# Patient Record
Sex: Female | Born: 1955
Health system: Southern US, Community
[De-identification: ages and names within clinical notes are randomized; demographics above are authoritative.]

## PROBLEM LIST (undated history)

## (undated) DIAGNOSIS — Z5189 Encounter for other specified aftercare: Secondary | ICD-10-CM

## (undated) DIAGNOSIS — F329 Major depressive disorder, single episode, unspecified: Secondary | ICD-10-CM

## (undated) DIAGNOSIS — I82409 Acute embolism and thrombosis of unspecified deep veins of unspecified lower extremity: Secondary | ICD-10-CM

## (undated) DIAGNOSIS — R319 Hematuria, unspecified: Secondary | ICD-10-CM

## (undated) DIAGNOSIS — D801 Nonfamilial hypogammaglobulinemia: Secondary | ICD-10-CM

## (undated) DIAGNOSIS — Z9889 Other specified postprocedural states: Secondary | ICD-10-CM

## (undated) DIAGNOSIS — F32A Depression, unspecified: Secondary | ICD-10-CM

## (undated) DIAGNOSIS — G244 Idiopathic orofacial dystonia: Secondary | ICD-10-CM

## (undated) DIAGNOSIS — I10 Essential (primary) hypertension: Secondary | ICD-10-CM

## (undated) DIAGNOSIS — J9690 Respiratory failure, unspecified, unspecified whether with hypoxia or hypercapnia: Secondary | ICD-10-CM

## (undated) DIAGNOSIS — E785 Hyperlipidemia, unspecified: Secondary | ICD-10-CM

## (undated) DIAGNOSIS — N189 Chronic kidney disease, unspecified: Secondary | ICD-10-CM

## (undated) DIAGNOSIS — K219 Gastro-esophageal reflux disease without esophagitis: Secondary | ICD-10-CM

## (undated) DIAGNOSIS — G47419 Narcolepsy without cataplexy: Principal | ICD-10-CM

## (undated) DIAGNOSIS — G620 Drug-induced polyneuropathy: Secondary | ICD-10-CM

## (undated) DIAGNOSIS — D509 Iron deficiency anemia, unspecified: Secondary | ICD-10-CM

## (undated) DIAGNOSIS — F419 Anxiety disorder, unspecified: Secondary | ICD-10-CM

## (undated) DIAGNOSIS — D689 Coagulation defect, unspecified: Secondary | ICD-10-CM

## (undated) DIAGNOSIS — C8330 Diffuse large B-cell lymphoma, unspecified site: Secondary | ICD-10-CM

## (undated) DIAGNOSIS — G4730 Sleep apnea, unspecified: Secondary | ICD-10-CM

## (undated) DIAGNOSIS — Z978 Presence of other specified devices: Secondary | ICD-10-CM

## (undated) DIAGNOSIS — I871 Compression of vein: Secondary | ICD-10-CM

## (undated) DIAGNOSIS — J189 Pneumonia, unspecified organism: Secondary | ICD-10-CM

## (undated) DIAGNOSIS — T7840XA Allergy, unspecified, initial encounter: Secondary | ICD-10-CM

## (undated) DIAGNOSIS — C8590 Non-Hodgkin lymphoma, unspecified, unspecified site: Secondary | ICD-10-CM

## (undated) DIAGNOSIS — E538 Deficiency of other specified B group vitamins: Secondary | ICD-10-CM

## (undated) DIAGNOSIS — D649 Anemia, unspecified: Secondary | ICD-10-CM

## (undated) DIAGNOSIS — J984 Other disorders of lung: Secondary | ICD-10-CM

## (undated) DIAGNOSIS — I219 Acute myocardial infarction, unspecified: Secondary | ICD-10-CM

## (undated) DIAGNOSIS — M810 Age-related osteoporosis without current pathological fracture: Secondary | ICD-10-CM

## (undated) DIAGNOSIS — H269 Unspecified cataract: Secondary | ICD-10-CM

## (undated) DIAGNOSIS — J449 Chronic obstructive pulmonary disease, unspecified: Secondary | ICD-10-CM

## (undated) DIAGNOSIS — R112 Nausea with vomiting, unspecified: Secondary | ICD-10-CM

## (undated) DIAGNOSIS — T451X5A Adverse effect of antineoplastic and immunosuppressive drugs, initial encounter: Secondary | ICD-10-CM

## (undated) HISTORY — DX: Age-related osteoporosis without current pathological fracture: M81.0

## (undated) HISTORY — PX: PERIPHERALLY INSERTED CENTRAL CATHETER INSERTION: SHX2221

## (undated) HISTORY — DX: Non-Hodgkin lymphoma, unspecified, unspecified site: C85.90

## (undated) HISTORY — DX: Depression, unspecified: F32.A

## (undated) HISTORY — DX: Adverse effect of antineoplastic and immunosuppressive drugs, initial encounter: T45.1X5A

## (undated) HISTORY — DX: Sleep apnea, unspecified: G47.30

## (undated) HISTORY — DX: Drug-induced polyneuropathy: G62.0

## (undated) HISTORY — DX: Iron deficiency anemia, unspecified: D50.9

## (undated) HISTORY — DX: Hematuria, unspecified: R31.9

## (undated) HISTORY — DX: Respiratory failure, unspecified, unspecified whether with hypoxia or hypercapnia: J96.90

## (undated) HISTORY — DX: Acute embolism and thrombosis of unspecified deep veins of unspecified lower extremity: I82.409

## (undated) HISTORY — DX: Deficiency of other specified B group vitamins: E53.8

## (undated) HISTORY — DX: Essential (primary) hypertension: I10

## (undated) HISTORY — DX: Pneumonia, unspecified organism: J18.9

## (undated) HISTORY — PX: VESICOVAGINAL FISTULA CLOSURE W/ TAH: SUR271

## (undated) HISTORY — PX: ABDOMINAL HYSTERECTOMY: SHX81

## (undated) HISTORY — DX: Narcolepsy without cataplexy: G47.419

## (undated) HISTORY — DX: Encounter for other specified aftercare: Z51.89

## (undated) HISTORY — DX: Allergy, unspecified, initial encounter: T78.40XA

## (undated) HISTORY — DX: Idiopathic orofacial dystonia: G24.4

## (undated) HISTORY — DX: Gastro-esophageal reflux disease without esophagitis: K21.9

## (undated) HISTORY — PX: OTHER SURGICAL HISTORY: SHX169

## (undated) HISTORY — PX: BREAST SURGERY: SHX581

## (undated) HISTORY — DX: Other disorders of lung: J98.4

## (undated) HISTORY — PX: TRACHEOSTOMY: SUR1362

## (undated) HISTORY — DX: Diffuse large B-cell lymphoma, unspecified site: C83.30

## (undated) HISTORY — PX: PORT-A-CATH REMOVAL: SHX5289

## (undated) HISTORY — DX: Coagulation defect, unspecified: D68.9

## (undated) HISTORY — DX: Chronic obstructive pulmonary disease, unspecified: J44.9

## (undated) HISTORY — PX: NECK SURGERY: SHX720

## (undated) HISTORY — DX: Major depressive disorder, single episode, unspecified: F32.9

## (undated) HISTORY — PX: EYE SURGERY: SHX253

## (undated) HISTORY — PX: NASAL SINUS SURGERY: SHX719

## (undated) HISTORY — DX: Unspecified cataract: H26.9

## (undated) HISTORY — DX: Hyperlipidemia, unspecified: E78.5

## (undated) HISTORY — DX: Nonfamilial hypogammaglobulinemia: D80.1

## (undated) HISTORY — DX: Compression of vein: I87.1

## (undated) SURGERY — COLONOSCOPY
Anesthesia: Moderate Sedation

---

## 1997-12-18 ENCOUNTER — Observation Stay (HOSPITAL_COMMUNITY): Admission: RE | Admit: 1997-12-18 | Discharge: 1997-12-19 | Payer: Self-pay | Admitting: Neurosurgery

## 1999-05-04 ENCOUNTER — Ambulatory Visit (HOSPITAL_COMMUNITY): Admission: RE | Admit: 1999-05-04 | Discharge: 1999-05-04 | Payer: Self-pay | Admitting: Neurosurgery

## 1999-05-04 ENCOUNTER — Encounter: Payer: Self-pay | Admitting: Neurosurgery

## 1999-05-11 ENCOUNTER — Encounter: Payer: Self-pay | Admitting: Neurosurgery

## 1999-05-11 ENCOUNTER — Ambulatory Visit (HOSPITAL_COMMUNITY): Admission: RE | Admit: 1999-05-11 | Discharge: 1999-05-11 | Payer: Self-pay | Admitting: Neurosurgery

## 2000-07-26 ENCOUNTER — Encounter: Payer: Self-pay | Admitting: Obstetrics and Gynecology

## 2000-07-26 ENCOUNTER — Ambulatory Visit (HOSPITAL_COMMUNITY): Admission: RE | Admit: 2000-07-26 | Discharge: 2000-07-26 | Payer: Self-pay | Admitting: Obstetrics and Gynecology

## 2001-01-31 ENCOUNTER — Other Ambulatory Visit: Admission: RE | Admit: 2001-01-31 | Discharge: 2001-01-31 | Payer: Self-pay | Admitting: Obstetrics and Gynecology

## 2001-11-15 ENCOUNTER — Encounter: Payer: Self-pay | Admitting: Obstetrics and Gynecology

## 2001-11-15 ENCOUNTER — Ambulatory Visit (HOSPITAL_COMMUNITY): Admission: RE | Admit: 2001-11-15 | Discharge: 2001-11-15 | Payer: Self-pay | Admitting: Obstetrics and Gynecology

## 2002-01-14 ENCOUNTER — Encounter: Payer: Self-pay | Admitting: Orthopaedic Surgery

## 2002-01-14 ENCOUNTER — Ambulatory Visit (HOSPITAL_COMMUNITY): Admission: RE | Admit: 2002-01-14 | Discharge: 2002-01-14 | Payer: Self-pay | Admitting: Orthopaedic Surgery

## 2002-01-23 ENCOUNTER — Encounter (HOSPITAL_COMMUNITY): Admission: RE | Admit: 2002-01-23 | Discharge: 2002-02-22 | Payer: Self-pay | Admitting: Orthopaedic Surgery

## 2002-08-26 ENCOUNTER — Inpatient Hospital Stay (HOSPITAL_COMMUNITY): Admission: RE | Admit: 2002-08-26 | Discharge: 2002-08-27 | Payer: Self-pay | Admitting: Neurosurgery

## 2002-10-16 ENCOUNTER — Encounter
Admission: RE | Admit: 2002-10-16 | Discharge: 2003-01-14 | Payer: Self-pay | Admitting: Physical Medicine & Rehabilitation

## 2002-11-26 ENCOUNTER — Ambulatory Visit (HOSPITAL_COMMUNITY): Admission: RE | Admit: 2002-11-26 | Discharge: 2002-11-26 | Payer: Self-pay | Admitting: Obstetrics and Gynecology

## 2002-12-10 ENCOUNTER — Encounter (HOSPITAL_COMMUNITY): Admission: RE | Admit: 2002-12-10 | Discharge: 2003-01-09 | Payer: Self-pay | Admitting: Neurosurgery

## 2002-12-31 ENCOUNTER — Ambulatory Visit (HOSPITAL_COMMUNITY): Admission: RE | Admit: 2002-12-31 | Discharge: 2002-12-31 | Payer: Self-pay | Admitting: Orthopedic Surgery

## 2003-01-29 ENCOUNTER — Encounter: Admission: RE | Admit: 2003-01-29 | Discharge: 2003-01-29 | Payer: Self-pay | Admitting: Orthopedic Surgery

## 2003-02-12 ENCOUNTER — Encounter: Admission: RE | Admit: 2003-02-12 | Discharge: 2003-02-12 | Payer: Self-pay | Admitting: Orthopedic Surgery

## 2003-02-28 ENCOUNTER — Encounter: Admission: RE | Admit: 2003-02-28 | Discharge: 2003-02-28 | Payer: Self-pay | Admitting: Orthopedic Surgery

## 2003-11-19 ENCOUNTER — Ambulatory Visit: Payer: Self-pay | Admitting: Orthopedic Surgery

## 2004-01-15 ENCOUNTER — Ambulatory Visit: Payer: Self-pay | Admitting: Orthopedic Surgery

## 2004-02-13 ENCOUNTER — Ambulatory Visit (HOSPITAL_COMMUNITY): Admission: RE | Admit: 2004-02-13 | Discharge: 2004-02-13 | Payer: Self-pay | Admitting: Obstetrics and Gynecology

## 2004-03-03 ENCOUNTER — Ambulatory Visit (HOSPITAL_COMMUNITY): Admission: RE | Admit: 2004-03-03 | Discharge: 2004-03-03 | Payer: Self-pay | Admitting: Obstetrics and Gynecology

## 2004-07-12 ENCOUNTER — Ambulatory Visit: Payer: Self-pay | Admitting: Orthopedic Surgery

## 2004-09-15 ENCOUNTER — Ambulatory Visit (HOSPITAL_COMMUNITY): Admission: RE | Admit: 2004-09-15 | Discharge: 2004-09-15 | Payer: Self-pay | Admitting: Family Medicine

## 2004-11-11 ENCOUNTER — Ambulatory Visit (HOSPITAL_COMMUNITY): Admission: RE | Admit: 2004-11-11 | Discharge: 2004-11-11 | Payer: Self-pay | Admitting: Family Medicine

## 2004-12-22 ENCOUNTER — Ambulatory Visit (HOSPITAL_COMMUNITY): Admission: RE | Admit: 2004-12-22 | Discharge: 2004-12-22 | Payer: Self-pay | Admitting: Family Medicine

## 2004-12-24 ENCOUNTER — Ambulatory Visit (HOSPITAL_COMMUNITY): Admission: RE | Admit: 2004-12-24 | Discharge: 2004-12-24 | Payer: Self-pay | Admitting: Family Medicine

## 2005-01-17 ENCOUNTER — Ambulatory Visit (HOSPITAL_COMMUNITY): Admission: RE | Admit: 2005-01-17 | Discharge: 2005-01-17 | Payer: Self-pay | Admitting: Family Medicine

## 2005-01-25 ENCOUNTER — Ambulatory Visit: Payer: Self-pay | Admitting: Critical Care Medicine

## 2005-01-26 ENCOUNTER — Ambulatory Visit: Admission: RE | Admit: 2005-01-26 | Discharge: 2005-01-26 | Payer: Self-pay | Admitting: Critical Care Medicine

## 2005-01-26 ENCOUNTER — Ambulatory Visit: Payer: Self-pay | Admitting: Critical Care Medicine

## 2005-01-31 ENCOUNTER — Ambulatory Visit: Payer: Self-pay | Admitting: Cardiology

## 2005-02-09 ENCOUNTER — Ambulatory Visit: Payer: Self-pay | Admitting: Critical Care Medicine

## 2005-03-07 ENCOUNTER — Ambulatory Visit: Payer: Self-pay | Admitting: Critical Care Medicine

## 2005-03-25 ENCOUNTER — Ambulatory Visit (HOSPITAL_COMMUNITY): Admission: RE | Admit: 2005-03-25 | Discharge: 2005-03-25 | Payer: Self-pay | Admitting: Obstetrics and Gynecology

## 2005-04-27 ENCOUNTER — Ambulatory Visit: Payer: Self-pay | Admitting: Critical Care Medicine

## 2005-06-10 ENCOUNTER — Ambulatory Visit: Payer: Self-pay | Admitting: Critical Care Medicine

## 2005-07-16 ENCOUNTER — Inpatient Hospital Stay (HOSPITAL_COMMUNITY): Admission: EM | Admit: 2005-07-16 | Discharge: 2005-07-17 | Payer: Self-pay | Admitting: Emergency Medicine

## 2005-08-11 ENCOUNTER — Ambulatory Visit: Payer: Self-pay | Admitting: Critical Care Medicine

## 2005-08-16 ENCOUNTER — Ambulatory Visit: Payer: Self-pay | Admitting: Internal Medicine

## 2005-09-21 ENCOUNTER — Ambulatory Visit: Payer: Self-pay | Admitting: Internal Medicine

## 2005-09-23 ENCOUNTER — Ambulatory Visit: Payer: Self-pay | Admitting: Internal Medicine

## 2005-09-30 ENCOUNTER — Ambulatory Visit: Payer: Self-pay | Admitting: Internal Medicine

## 2005-10-04 ENCOUNTER — Ambulatory Visit: Payer: Self-pay | Admitting: Internal Medicine

## 2005-10-07 ENCOUNTER — Ambulatory Visit: Payer: Self-pay | Admitting: Internal Medicine

## 2005-10-10 ENCOUNTER — Ambulatory Visit: Payer: Self-pay | Admitting: Internal Medicine

## 2005-10-14 ENCOUNTER — Ambulatory Visit: Payer: Self-pay | Admitting: Internal Medicine

## 2005-10-18 ENCOUNTER — Ambulatory Visit: Payer: Self-pay | Admitting: Internal Medicine

## 2005-10-21 ENCOUNTER — Ambulatory Visit: Payer: Self-pay | Admitting: Internal Medicine

## 2005-10-24 IMAGING — US US BREAST*L*
1 series · 4 of 4 positions shown · non-contrast
Comparison: none

UNILATERAL LEFT DIAGNOSTIC MAMMOGRAM AND LEFT BREAST ULTRASOUND:
CLINICAL DATA: The patient returns for further evaluation of small nodule in the retroareolar 
region of the left breast.

[Series 1: unknown · 0.07mm/px · 4 of 4 slices shown]
[im 1/4]
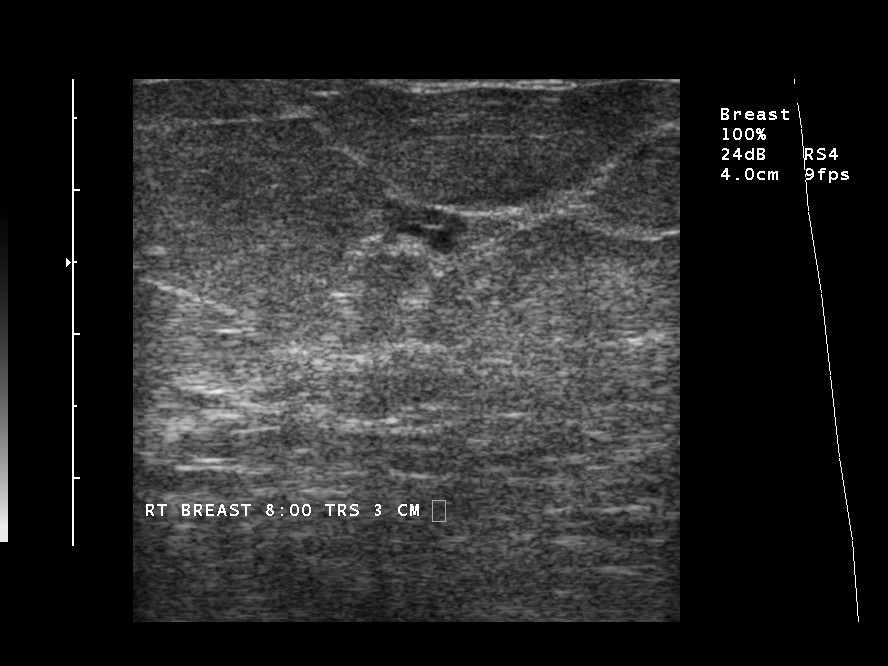
[im 2/4]
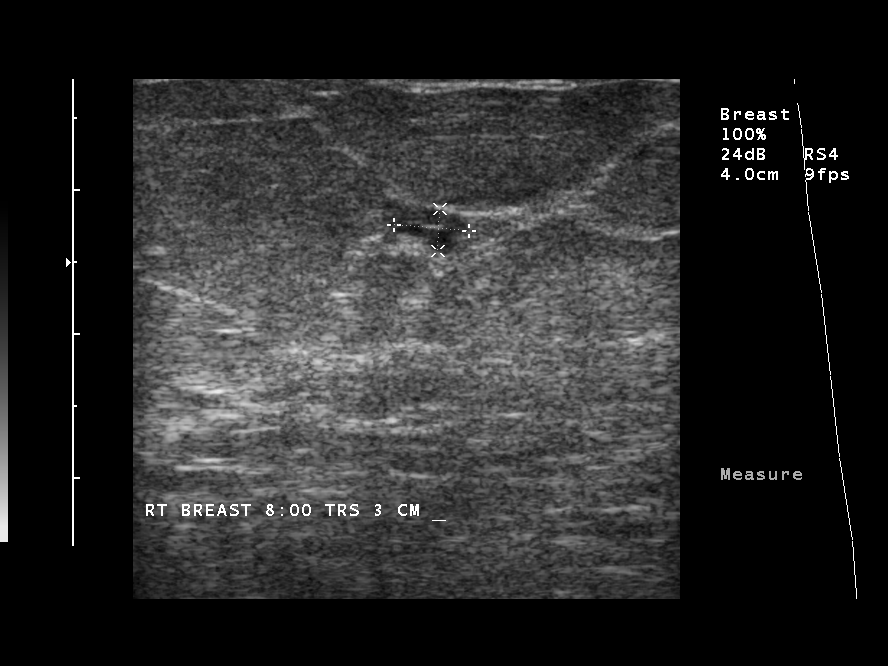
[im 3/4]
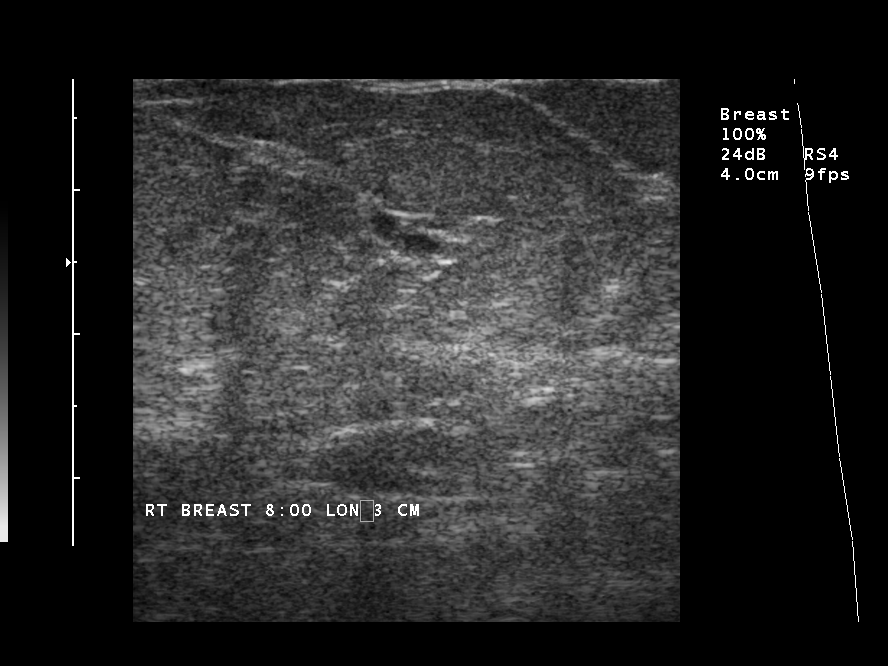
[im 4/4]
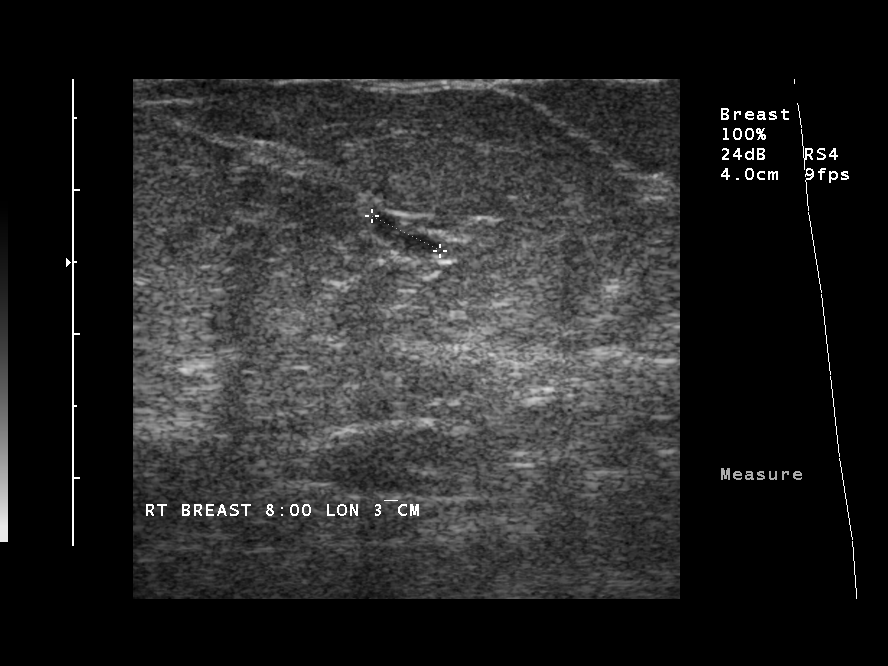

[4 of 4 positions shown; findings below may reference images not displayed]

Comparison with prior studies  02-13-04 and 12-05-02.   Spot compression views confirm its 
presence, and show well-circumscribed margins.

On physical exam, I am unable to palpate an abnormality in the retroareolar region of the left 
breast.  Ultrasound is performed, showing small cluster of cysts versus small intramammary lymph 
node in the retroareolar region, correlating well with the mammographic appearance.  No solid mass 
or area of acoustic shadowing is seen.
IMPRESSION: Small retroareolar nodule is likely benign based on its mammographic and ultrasound appearance.  I 
would recommend follow-up left ultrasound in six months to assess stability.

ASSESSMENT: Probably benign - BI-RADS 3

Ultrasound of the left breast in 6 months.

## 2005-10-25 ENCOUNTER — Ambulatory Visit: Payer: Self-pay | Admitting: Internal Medicine

## 2005-10-26 ENCOUNTER — Ambulatory Visit: Payer: Self-pay | Admitting: Internal Medicine

## 2005-10-28 ENCOUNTER — Ambulatory Visit: Payer: Self-pay | Admitting: Internal Medicine

## 2005-10-28 ENCOUNTER — Emergency Department (HOSPITAL_COMMUNITY): Admission: EM | Admit: 2005-10-28 | Discharge: 2005-10-28 | Payer: Self-pay | Admitting: Emergency Medicine

## 2005-11-01 ENCOUNTER — Ambulatory Visit: Payer: Self-pay | Admitting: Internal Medicine

## 2005-11-02 ENCOUNTER — Ambulatory Visit: Payer: Self-pay | Admitting: Internal Medicine

## 2005-11-04 ENCOUNTER — Ambulatory Visit: Payer: Self-pay | Admitting: Internal Medicine

## 2005-11-15 ENCOUNTER — Ambulatory Visit: Payer: Self-pay | Admitting: Internal Medicine

## 2005-12-05 ENCOUNTER — Ambulatory Visit: Payer: Self-pay | Admitting: Internal Medicine

## 2005-12-17 ENCOUNTER — Emergency Department (HOSPITAL_COMMUNITY): Admission: EM | Admit: 2005-12-17 | Discharge: 2005-12-17 | Payer: Self-pay | Admitting: Emergency Medicine

## 2005-12-26 ENCOUNTER — Ambulatory Visit (HOSPITAL_COMMUNITY): Admission: RE | Admit: 2005-12-26 | Discharge: 2005-12-26 | Payer: Self-pay | Admitting: Family Medicine

## 2006-01-16 ENCOUNTER — Ambulatory Visit: Payer: Self-pay | Admitting: Internal Medicine

## 2006-02-14 ENCOUNTER — Ambulatory Visit: Payer: Self-pay | Admitting: Internal Medicine

## 2006-02-20 ENCOUNTER — Ambulatory Visit: Payer: Self-pay | Admitting: Internal Medicine

## 2006-03-03 ENCOUNTER — Ambulatory Visit (HOSPITAL_COMMUNITY): Admission: RE | Admit: 2006-03-03 | Discharge: 2006-03-03 | Payer: Self-pay | Admitting: *Deleted

## 2006-04-13 ENCOUNTER — Ambulatory Visit: Payer: Self-pay | Admitting: Critical Care Medicine

## 2006-04-13 ENCOUNTER — Ambulatory Visit (HOSPITAL_COMMUNITY): Admission: RE | Admit: 2006-04-13 | Discharge: 2006-04-13 | Payer: Self-pay | Admitting: Obstetrics and Gynecology

## 2006-05-17 ENCOUNTER — Ambulatory Visit (HOSPITAL_COMMUNITY): Admission: RE | Admit: 2006-05-17 | Discharge: 2006-05-17 | Payer: Self-pay | Admitting: Obstetrics and Gynecology

## 2006-05-25 ENCOUNTER — Ambulatory Visit: Payer: Self-pay | Admitting: Critical Care Medicine

## 2006-05-25 ENCOUNTER — Ambulatory Visit: Payer: Self-pay | Admitting: Internal Medicine

## 2006-07-10 ENCOUNTER — Ambulatory Visit: Payer: Self-pay | Admitting: Gastroenterology

## 2006-07-25 ENCOUNTER — Encounter: Payer: Self-pay | Admitting: Gastroenterology

## 2006-07-25 ENCOUNTER — Ambulatory Visit: Payer: Self-pay | Admitting: Gastroenterology

## 2006-07-25 DIAGNOSIS — D131 Benign neoplasm of stomach: Secondary | ICD-10-CM | POA: Insufficient documentation

## 2006-08-16 IMAGING — US US ABDOMEN LIMITED
1 series · 14 of 25 positions shown · non-contrast
Comparison: Report of the chest CT performed at [REDACTED] on
12/17/2004.

<!--  IDXRADR:ADDEND:BEGIN -->Addendum Begins
<!--  IDXRADR:ADDEND:INNER_BEGIN -->Addendum: 

The chest CT images, dated 12/17/2004, are now available for comparison. The low
density lesion in the liver, seen on the CT, appears to correlate with the more
superiorly located lesion seen today. The more inferiorly located lesion does
not appear to have been included on the CT.
<!--  IDXRADR:ADDEND:INNER_END -->Addendum Ends
<!--  IDXRADR:ADDEND:END -->Clinical Data:  Approximately 1.5 cm low-density lesion in the lower portion of
the right lobe of the liver on a chest CT performed at [REDACTED]
on 12/17/2004.
LIMITED ABDOMEN ULTRASOUND

[Series 1: unknown · 0.33mm/px · 14 of 31 slices shown]
[im 1/31]
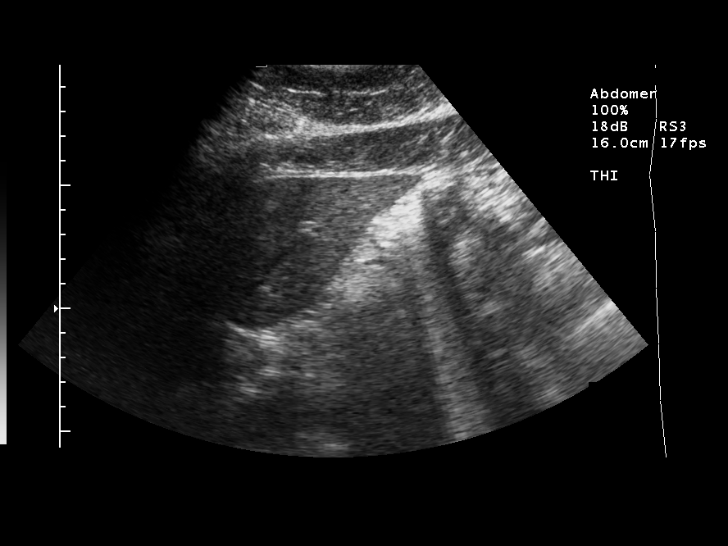
[im 3/31]
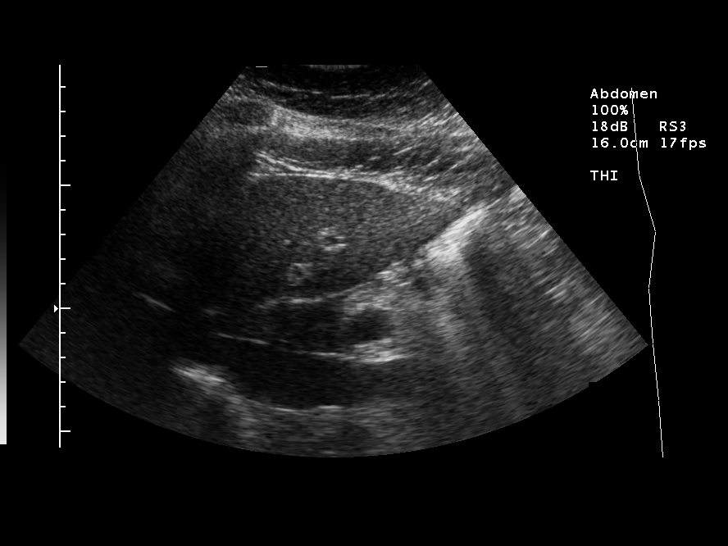
[im 6/31]
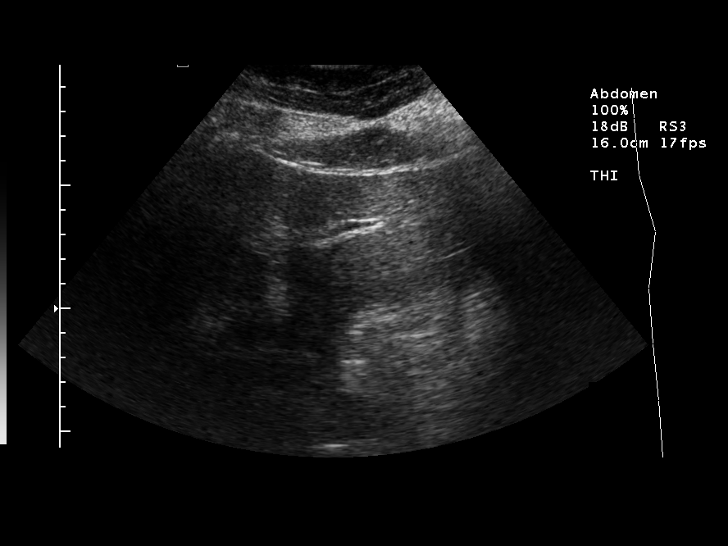
[im 8/31]
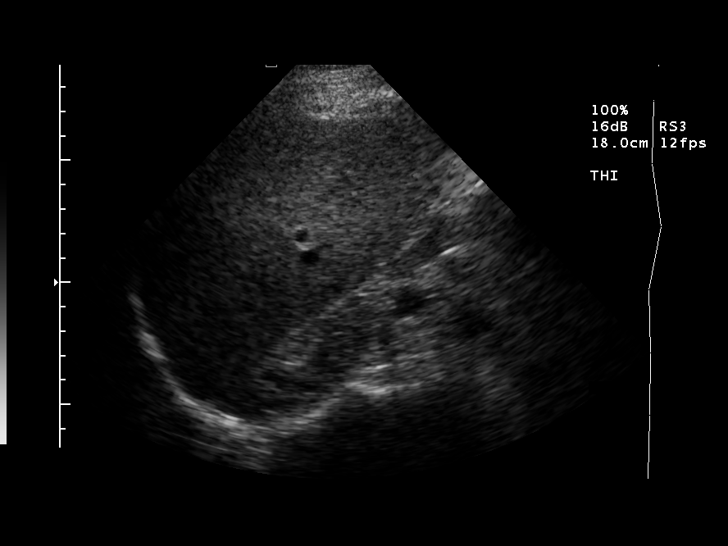
[im 11/31]
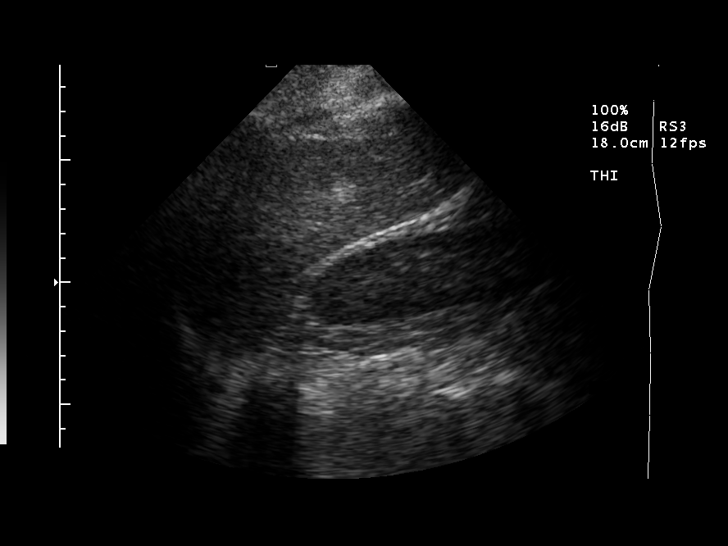
[im 12/31]
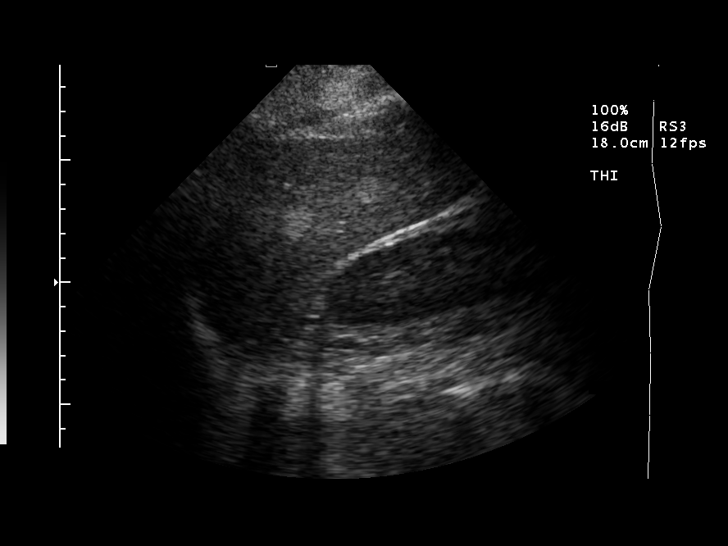
[im 14/31]
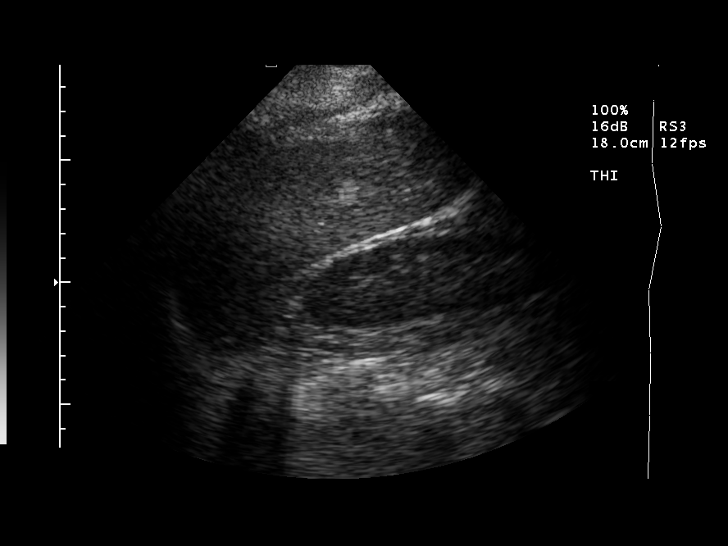
[im 17/31]
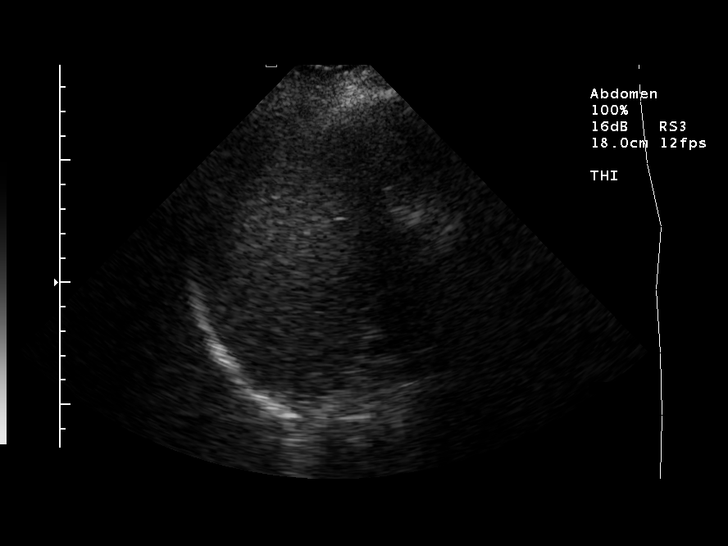
[im 19/31]
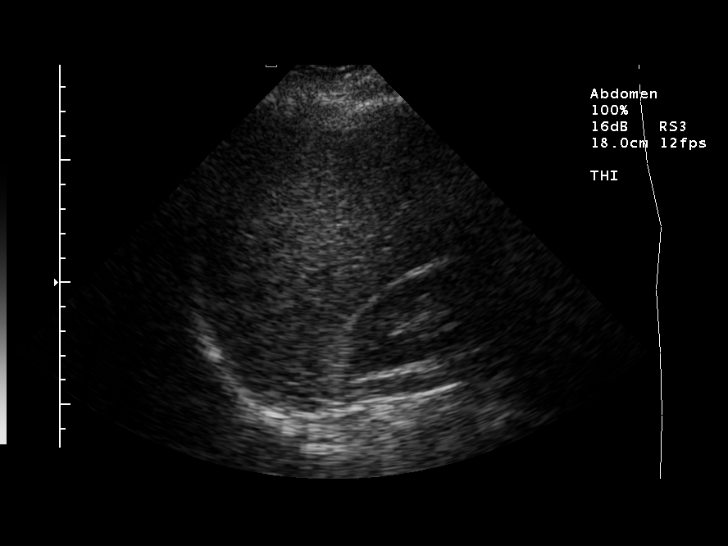
[im 21/31]
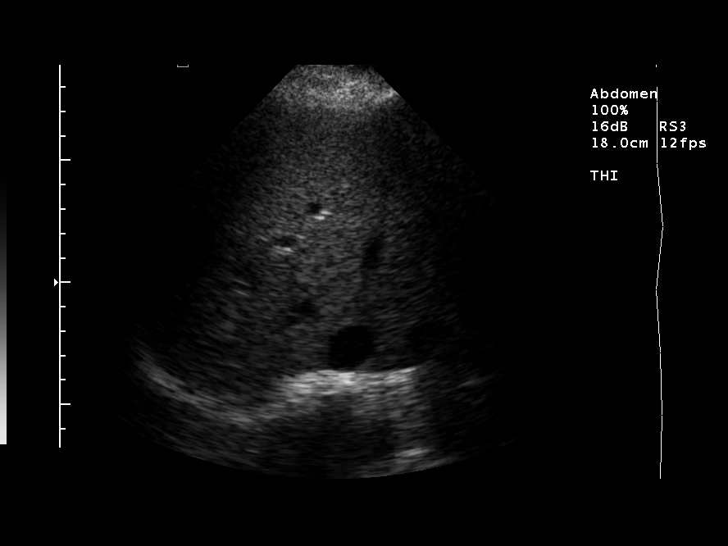
[im 23/31]
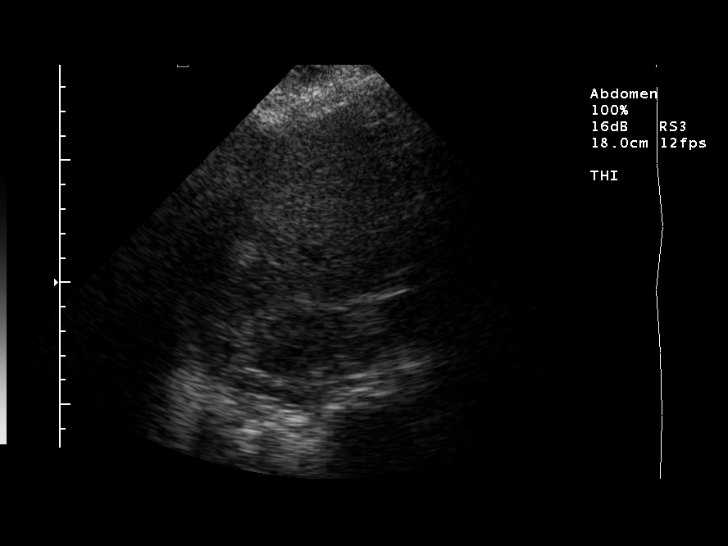
[im 26/31]
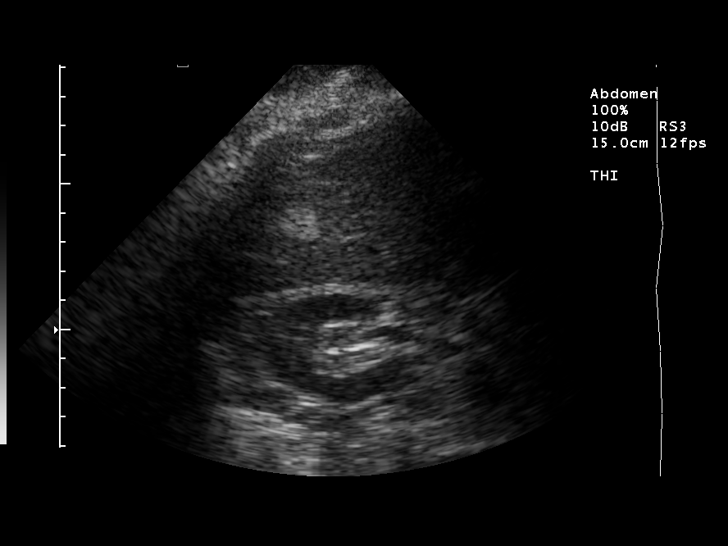
[im 28/31]
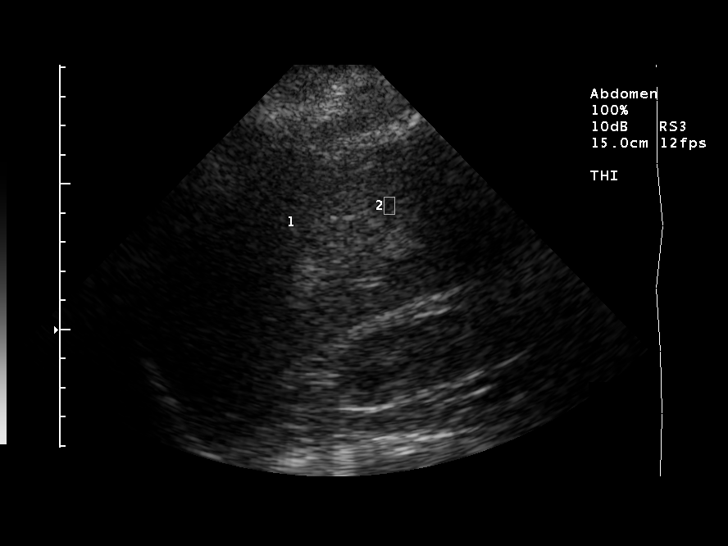
[im 31/31]
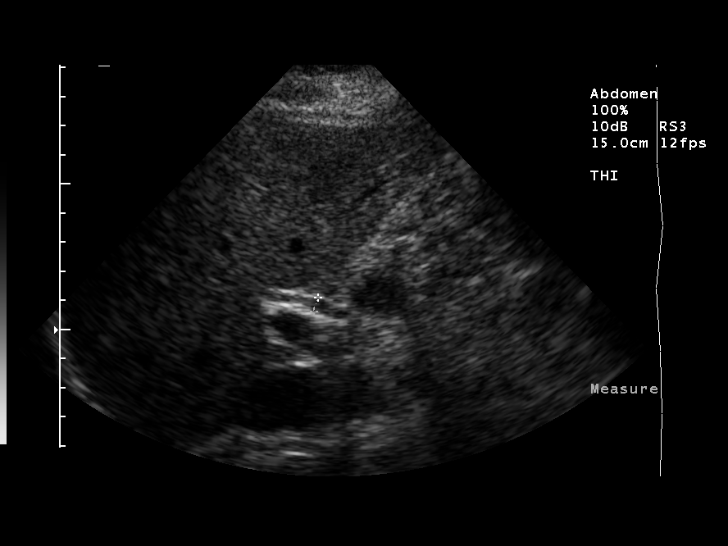

[14 of 25 positions shown; findings below may reference images not displayed]

FINDINGS: Sonographic examination of the liver demonstrates two adjacent
rounded, homogeneously echogenic masses in the right lobe of the liver. One of
these measures 1.6 x 1.4 x 1.3 cm in maximum dimensions. The other measures
x 1.3 x 1.3 cm in maximum dimensions. No biliary ductal dilatation seen. The
common duct measures 5.0 mm in maximum diameter proximally.

IMPRESSION

1.6 and 1.4 cm hemangiomas in the right lobe of the liver.

## 2006-08-31 ENCOUNTER — Ambulatory Visit (HOSPITAL_COMMUNITY): Admission: RE | Admit: 2006-08-31 | Discharge: 2006-08-31 | Payer: Self-pay | Admitting: Gastroenterology

## 2006-09-23 IMAGING — CT CT PARANASAL SINUSES LIMITED
1 of 2 series · 16 of 25 positions shown, 20 images · non-contrast
Comparison: none

CLINICAL DATA: Cough, sinus drainage.  History of asthma.
LIMITED CT OF PARANASAL SINUSES:
TECHNIQUE: Limited coronal CT images were obtained through the paranasal sinuses without intravenous contrast.

[Series 4: ltd sinus 3.0 h30s · axial · 0.29mm/px · z∈[-101,-3]mm · 16 of 24 slices shown, 20 images]
[im 2/24  brain]
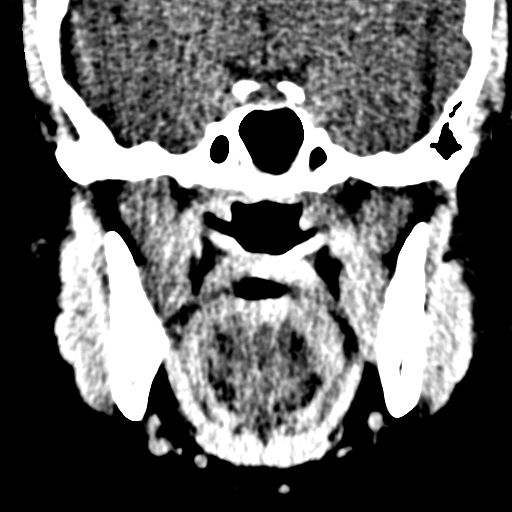
[im 2/24  bone]
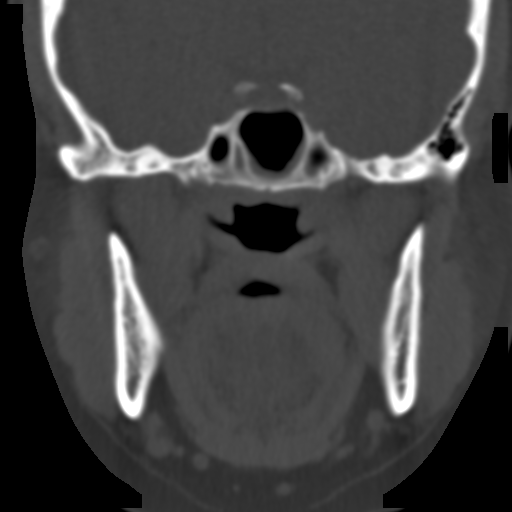
[im 4/24  bone]
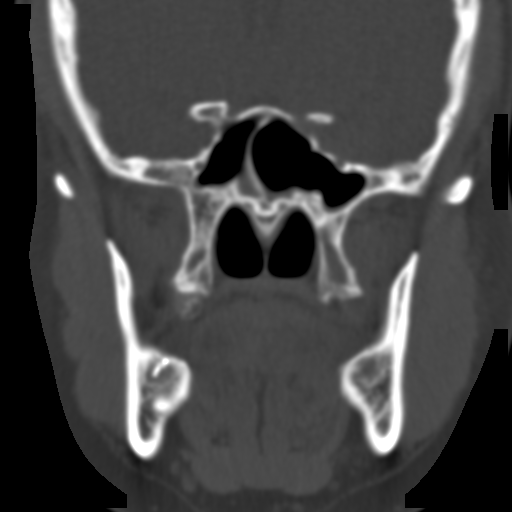
[im 5/24  bone]
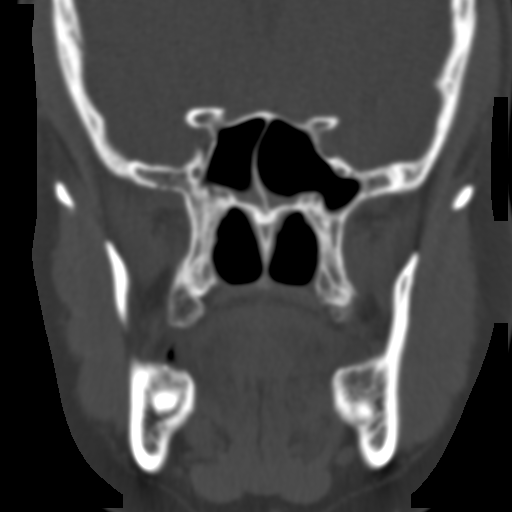
[im 6/24  bone]
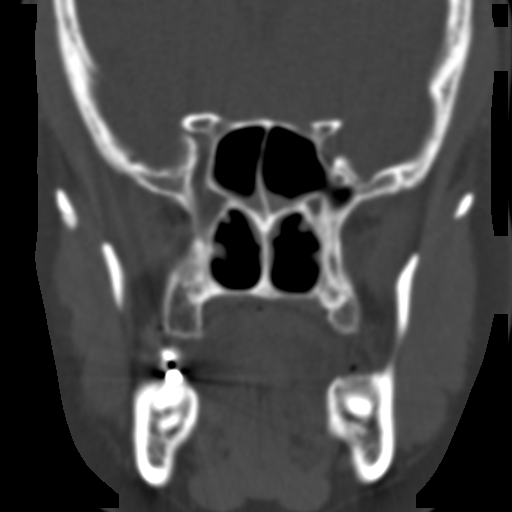
[im 8/24  brain]
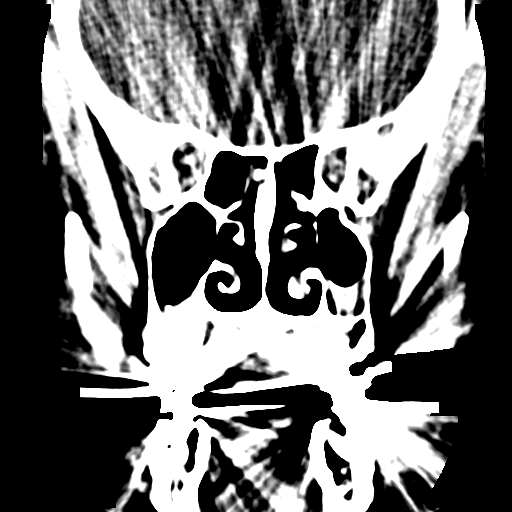
[im 8/24  bone]
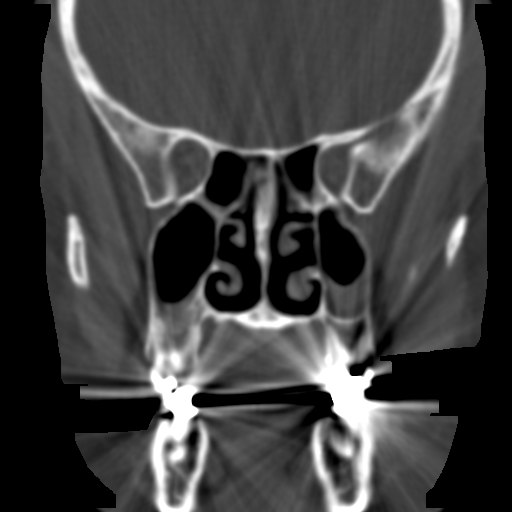
[im 9/24  bone]
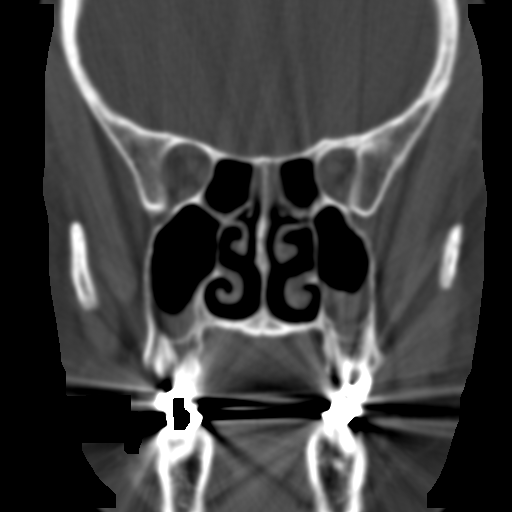
[im 10/24  bone]
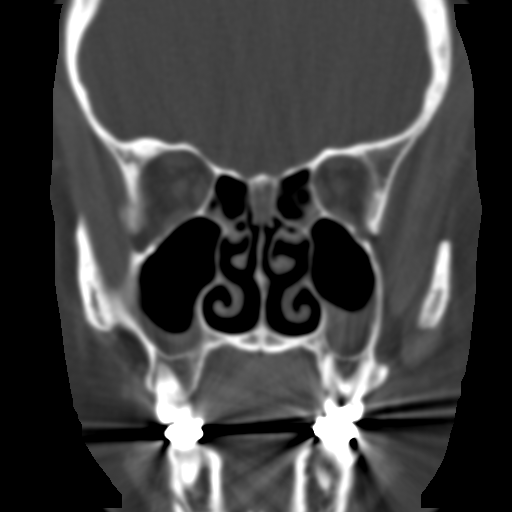
[im 12/24  bone]
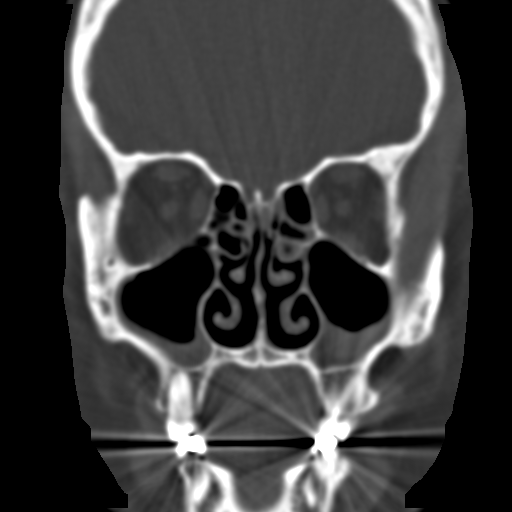
[im 13/24  brain]
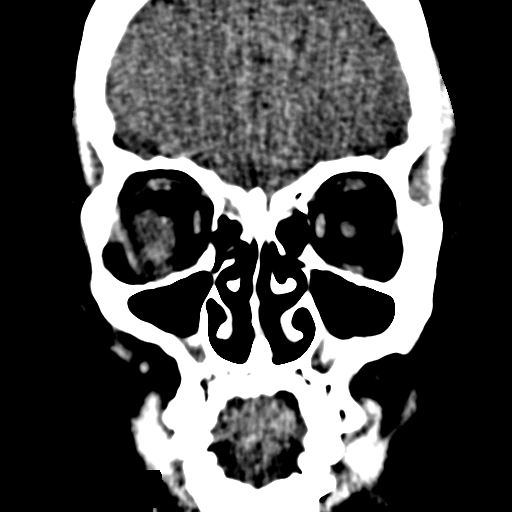
[im 13/24  bone]
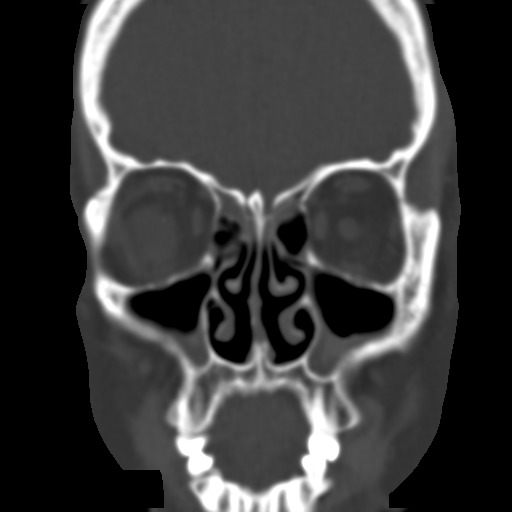
[im 15/24  bone]
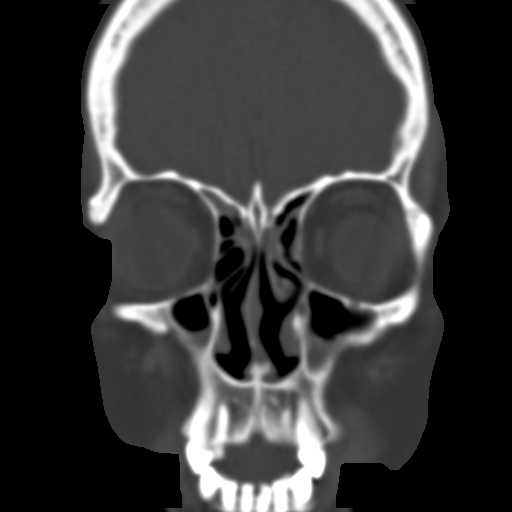
[im 16/24  bone]
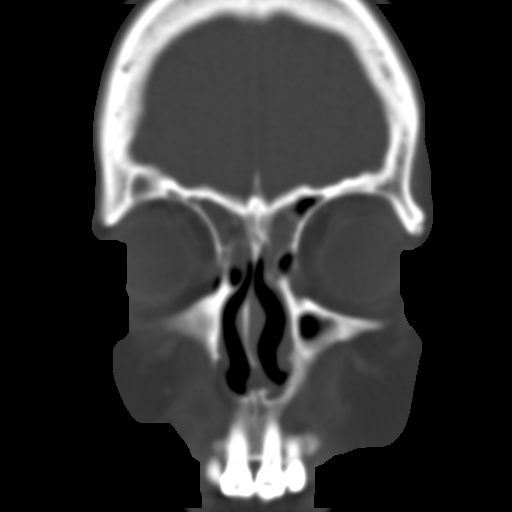
[im 17/24  bone]
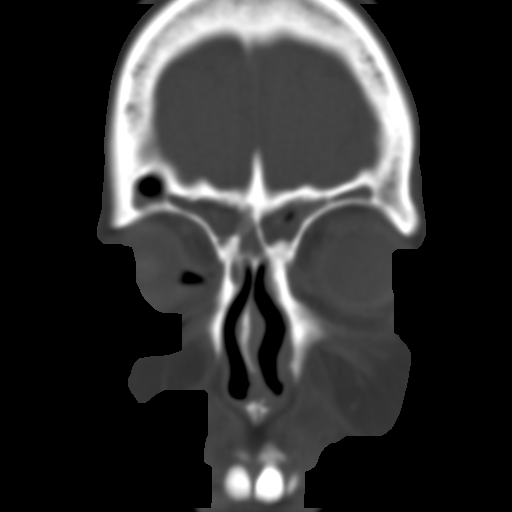
[im 19/24  brain]
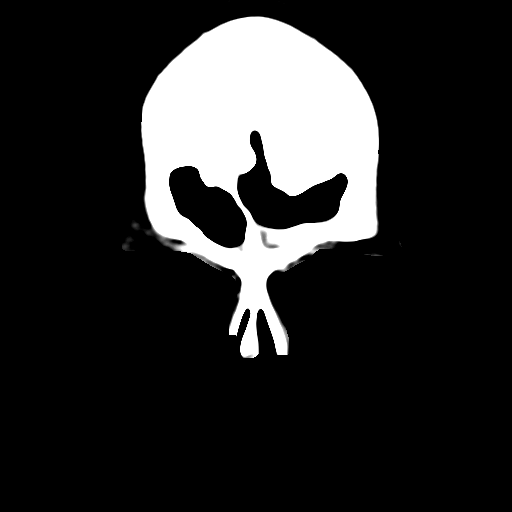
[im 19/24  bone]
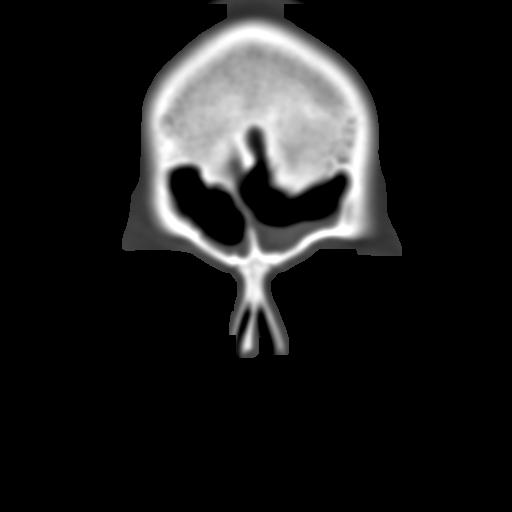
[im 20/24  bone]
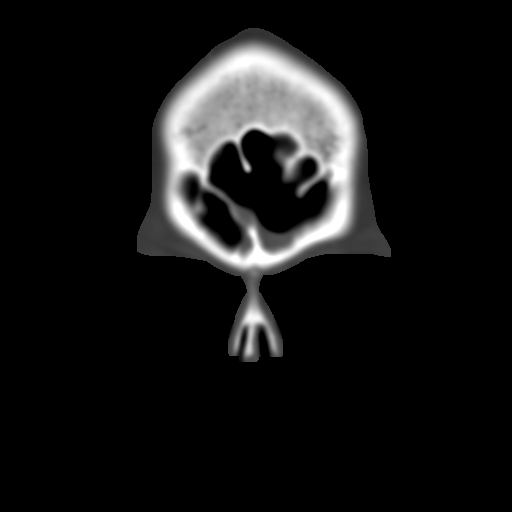
[im 21/24  bone]
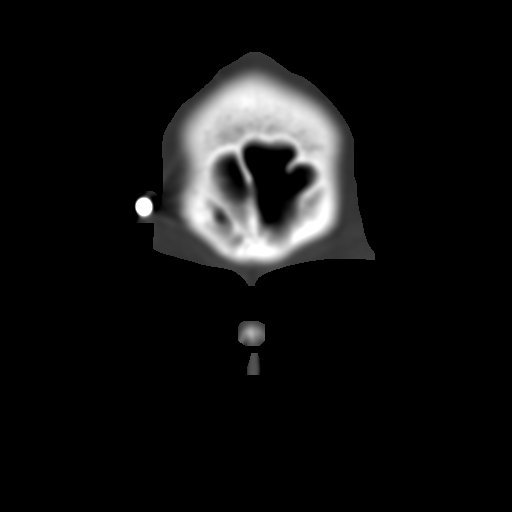
[im 23/24  bone]
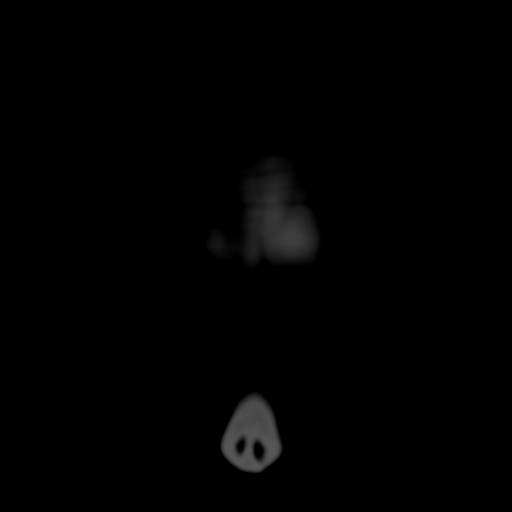

[16 of 25 positions shown; findings below may reference images not displayed]

FINDINGS: There is mucosal thickening in the sphenoid sinus, with mucosal thickening and a small amount of fluid in the maxillary sinuses.  There is opacification of the frontal sinuses, left greater than right, with some opacification of the ethmoid air cells.  These findings are consistent with paranasal sinus disease with active sinusitis involving the frontal and to a lesser degree the maxillary sinuses.  The nasal turbinates are small and the nasal airway is patent.
IMPRESSION: Diffuse paranasal sinus disease with sinusitis involving the frontal and maxillary sinuses and mucosal thickening in the ethmoid and sphenoid sinuses.

## 2006-10-13 ENCOUNTER — Ambulatory Visit: Payer: Self-pay | Admitting: Internal Medicine

## 2006-10-17 ENCOUNTER — Other Ambulatory Visit: Admission: RE | Admit: 2006-10-17 | Discharge: 2006-10-17 | Payer: Self-pay | Admitting: Obstetrics and Gynecology

## 2006-10-19 DIAGNOSIS — K219 Gastro-esophageal reflux disease without esophagitis: Secondary | ICD-10-CM | POA: Insufficient documentation

## 2006-10-19 DIAGNOSIS — J449 Chronic obstructive pulmonary disease, unspecified: Secondary | ICD-10-CM | POA: Insufficient documentation

## 2006-10-19 DIAGNOSIS — J019 Acute sinusitis, unspecified: Secondary | ICD-10-CM | POA: Insufficient documentation

## 2006-10-19 DIAGNOSIS — J309 Allergic rhinitis, unspecified: Secondary | ICD-10-CM | POA: Insufficient documentation

## 2006-10-19 DIAGNOSIS — J4489 Other specified chronic obstructive pulmonary disease: Secondary | ICD-10-CM | POA: Insufficient documentation

## 2006-10-19 DIAGNOSIS — J383 Other diseases of vocal cords: Secondary | ICD-10-CM | POA: Insufficient documentation

## 2006-12-20 ENCOUNTER — Ambulatory Visit: Payer: Self-pay | Admitting: Critical Care Medicine

## 2007-02-23 ENCOUNTER — Ambulatory Visit: Payer: Self-pay | Admitting: Critical Care Medicine

## 2007-02-28 ENCOUNTER — Ambulatory Visit: Payer: Self-pay | Admitting: Internal Medicine

## 2007-03-01 ENCOUNTER — Encounter: Payer: Self-pay | Admitting: Critical Care Medicine

## 2007-03-08 IMAGING — CR DG CHEST 1V PORT
1 series · 1 of 1 positions shown · non-contrast
Comparison: 01/17/05.

CLINICAL DATA: Asthma.  Shortness of breath.  
 PORTABLE CHEST -  SINGLE VIEW - 07/16/05:

[view not recorded]
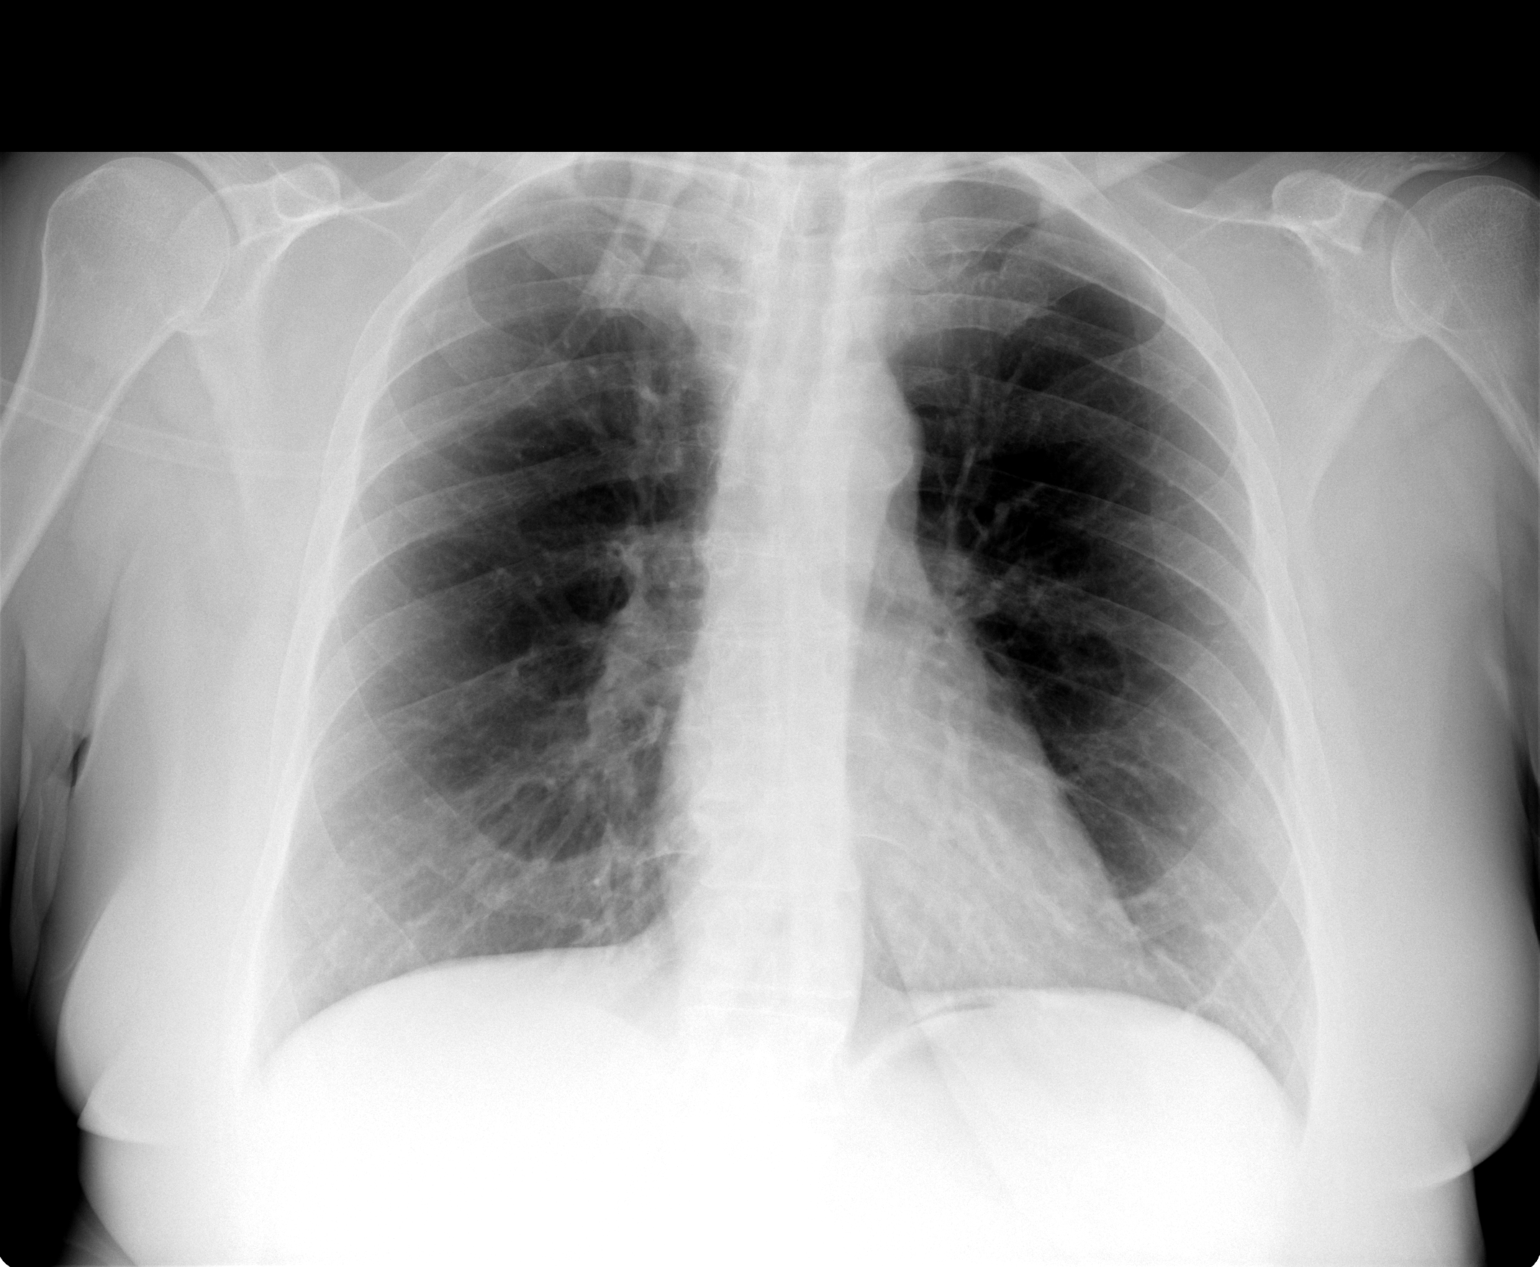

[1 of 1 positions shown; findings below may reference images not displayed]

FINDINGS: Midline trachea. The heart size is normal.   Mediastinal contours are within normal limits.  Mild atherosclerosis of the transverse aorta.  The costophrenic angles are sharp.  The lungs are clear.   There is mild COPD.  There is a lucency under the left hemidiaphragm.  It most likely relates to air within the stomach.
IMPRESSION: 1.  COPD with no acute cardiopulmonary disease.
 2.  Lucency under the left hemidiaphragm is likely within the stomach.  Correlate with any abdominal symptoms.

## 2007-03-28 ENCOUNTER — Telehealth: Payer: Self-pay | Admitting: Critical Care Medicine

## 2007-03-29 ENCOUNTER — Ambulatory Visit: Payer: Self-pay | Admitting: Critical Care Medicine

## 2007-04-03 DIAGNOSIS — J4540 Moderate persistent asthma, uncomplicated: Secondary | ICD-10-CM | POA: Insufficient documentation

## 2007-04-21 ENCOUNTER — Encounter: Payer: Self-pay | Admitting: Gastroenterology

## 2007-04-21 DIAGNOSIS — R7309 Other abnormal glucose: Secondary | ICD-10-CM | POA: Insufficient documentation

## 2007-04-21 DIAGNOSIS — I10 Essential (primary) hypertension: Secondary | ICD-10-CM | POA: Insufficient documentation

## 2007-04-21 DIAGNOSIS — E785 Hyperlipidemia, unspecified: Secondary | ICD-10-CM | POA: Insufficient documentation

## 2007-04-26 ENCOUNTER — Ambulatory Visit: Payer: Self-pay | Admitting: Critical Care Medicine

## 2007-05-10 ENCOUNTER — Ambulatory Visit: Payer: Self-pay | Admitting: Internal Medicine

## 2007-05-24 ENCOUNTER — Ambulatory Visit: Payer: Self-pay | Admitting: Internal Medicine

## 2007-06-01 ENCOUNTER — Telehealth (INDEPENDENT_AMBULATORY_CARE_PROVIDER_SITE_OTHER): Payer: Self-pay | Admitting: *Deleted

## 2007-06-05 ENCOUNTER — Ambulatory Visit (HOSPITAL_COMMUNITY): Admission: RE | Admit: 2007-06-05 | Discharge: 2007-06-05 | Payer: Self-pay | Admitting: Family Medicine

## 2007-06-07 ENCOUNTER — Ambulatory Visit: Payer: Self-pay | Admitting: Critical Care Medicine

## 2007-06-28 ENCOUNTER — Ambulatory Visit: Payer: Self-pay | Admitting: Critical Care Medicine

## 2007-07-27 ENCOUNTER — Ambulatory Visit: Payer: Self-pay | Admitting: Critical Care Medicine

## 2007-08-02 ENCOUNTER — Ambulatory Visit: Payer: Self-pay | Admitting: Internal Medicine

## 2007-08-09 IMAGING — CR DG CHEST 1V PORT
1 series · 1 of 1 positions shown · non-contrast
Comparison: Portable chest x-ray 07/16/2005.

CLINICAL DATA: Asthma exacerbation with wheezing and shortness of breath.

PORTABLE CHEST - 1 VIEW  [DATE]/[id number] hours:

[view not recorded]
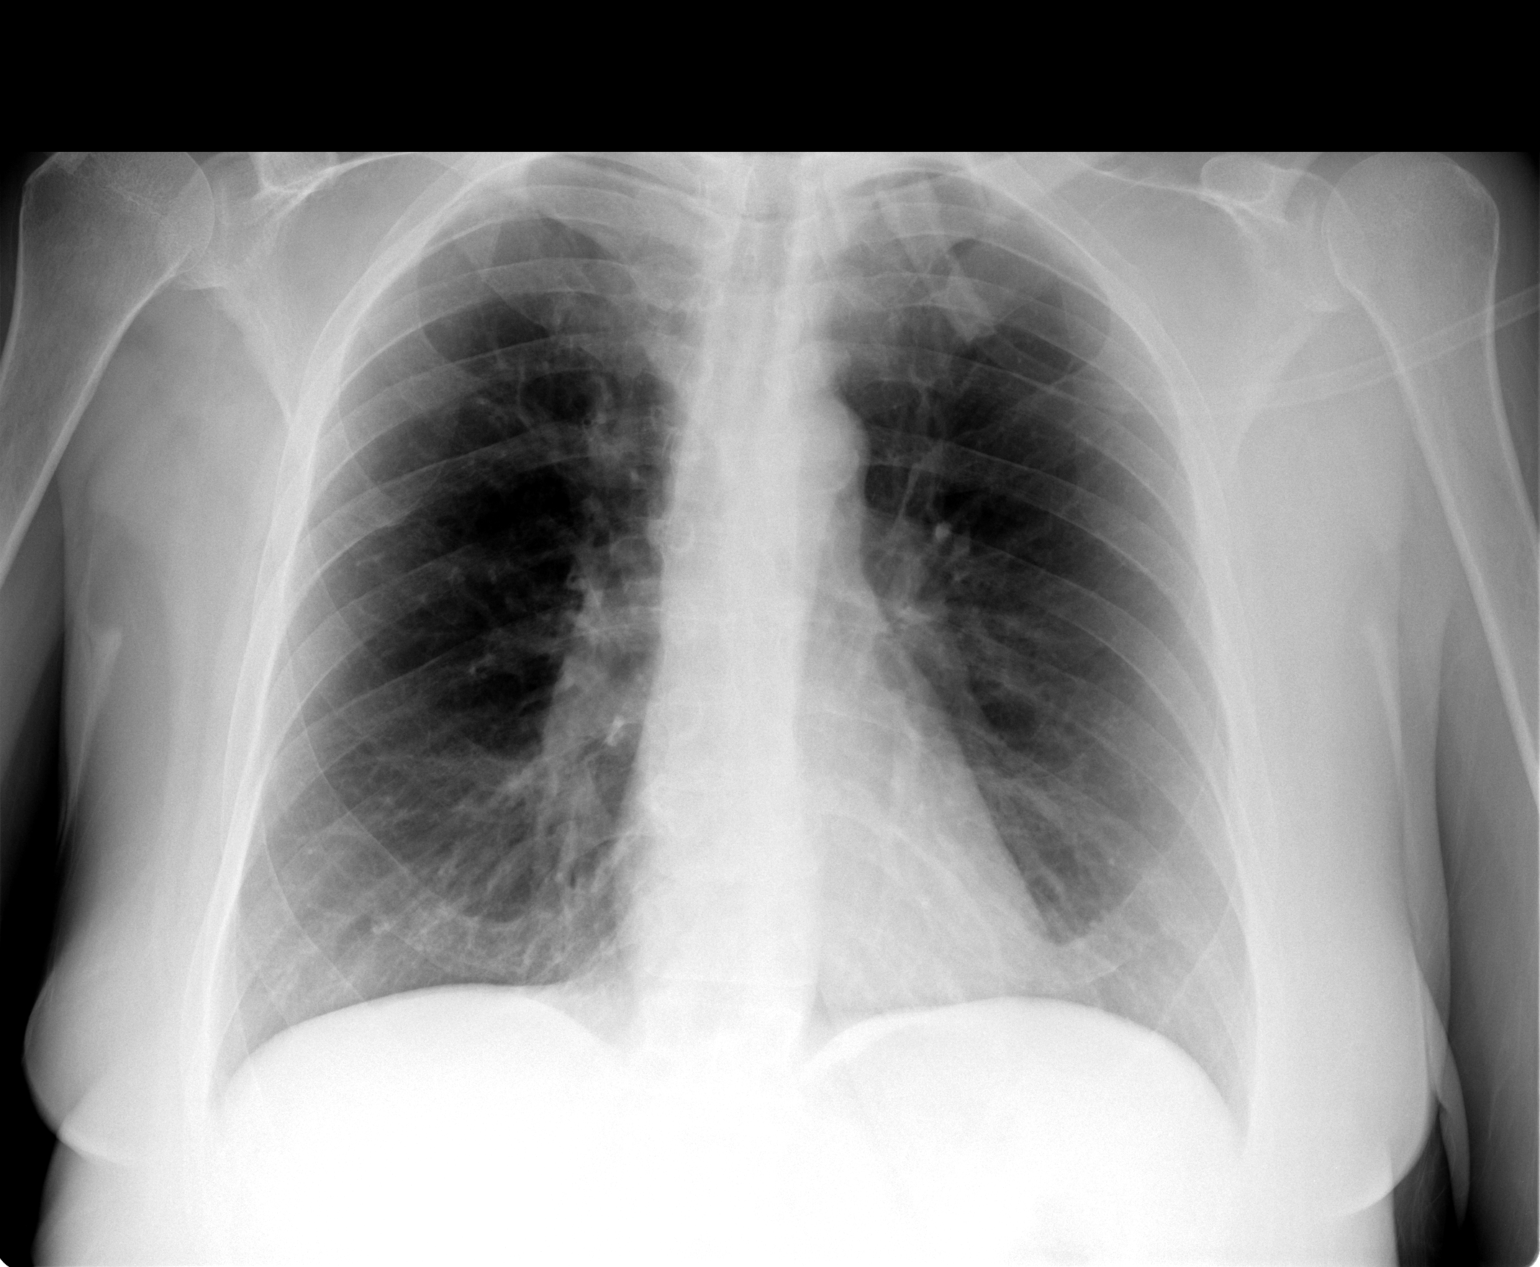

[1 of 1 positions shown; findings below may reference images not displayed]

FINDINGS: The cardiomediastinal silhouette is unremarkable and is unchanged.
The lungs are clear. There are no pleural effusions.
IMPRESSION: No acute cardiopulmonary disease.

## 2007-08-28 ENCOUNTER — Ambulatory Visit: Payer: Self-pay | Admitting: Critical Care Medicine

## 2007-09-07 ENCOUNTER — Ambulatory Visit: Payer: Self-pay | Admitting: Critical Care Medicine

## 2007-10-08 ENCOUNTER — Ambulatory Visit: Payer: Self-pay | Admitting: Critical Care Medicine

## 2007-11-19 ENCOUNTER — Ambulatory Visit: Payer: Self-pay | Admitting: Critical Care Medicine

## 2007-12-10 ENCOUNTER — Ambulatory Visit: Payer: Self-pay | Admitting: Critical Care Medicine

## 2007-12-24 ENCOUNTER — Encounter: Payer: Self-pay | Admitting: Critical Care Medicine

## 2008-01-15 ENCOUNTER — Telehealth: Payer: Self-pay | Admitting: Critical Care Medicine

## 2008-01-18 ENCOUNTER — Ambulatory Visit: Payer: Self-pay | Admitting: Critical Care Medicine

## 2008-01-19 ENCOUNTER — Emergency Department (HOSPITAL_COMMUNITY): Admission: EM | Admit: 2008-01-19 | Discharge: 2008-01-20 | Payer: Self-pay | Admitting: Emergency Medicine

## 2008-02-12 ENCOUNTER — Ambulatory Visit: Payer: Self-pay | Admitting: Critical Care Medicine

## 2008-02-12 ENCOUNTER — Inpatient Hospital Stay (HOSPITAL_COMMUNITY): Admission: EM | Admit: 2008-02-12 | Discharge: 2008-02-14 | Payer: Self-pay | Admitting: Emergency Medicine

## 2008-02-14 ENCOUNTER — Encounter: Payer: Self-pay | Admitting: Critical Care Medicine

## 2008-02-21 ENCOUNTER — Ambulatory Visit: Payer: Self-pay | Admitting: Critical Care Medicine

## 2008-02-29 ENCOUNTER — Telehealth (INDEPENDENT_AMBULATORY_CARE_PROVIDER_SITE_OTHER): Payer: Self-pay | Admitting: *Deleted

## 2008-03-06 ENCOUNTER — Telehealth: Payer: Self-pay | Admitting: Adult Health

## 2008-03-24 ENCOUNTER — Ambulatory Visit: Payer: Self-pay | Admitting: Critical Care Medicine

## 2008-04-09 ENCOUNTER — Ambulatory Visit: Payer: Self-pay | Admitting: Internal Medicine

## 2008-04-15 ENCOUNTER — Ambulatory Visit: Payer: Self-pay | Admitting: Critical Care Medicine

## 2008-04-28 ENCOUNTER — Ambulatory Visit: Payer: Self-pay | Admitting: Internal Medicine

## 2008-05-30 ENCOUNTER — Ambulatory Visit: Payer: Self-pay | Admitting: Critical Care Medicine

## 2008-06-16 ENCOUNTER — Ambulatory Visit: Payer: Self-pay | Admitting: Critical Care Medicine

## 2008-06-30 ENCOUNTER — Ambulatory Visit: Payer: Self-pay | Admitting: Critical Care Medicine

## 2008-07-04 ENCOUNTER — Ambulatory Visit (HOSPITAL_COMMUNITY): Admission: RE | Admit: 2008-07-04 | Discharge: 2008-07-04 | Payer: Self-pay | Admitting: Obstetrics & Gynecology

## 2008-07-30 ENCOUNTER — Ambulatory Visit: Payer: Self-pay | Admitting: Critical Care Medicine

## 2008-08-08 ENCOUNTER — Ambulatory Visit: Payer: Self-pay | Admitting: Critical Care Medicine

## 2008-08-27 ENCOUNTER — Encounter: Payer: Self-pay | Admitting: Critical Care Medicine

## 2008-09-01 ENCOUNTER — Ambulatory Visit: Payer: Self-pay | Admitting: Internal Medicine

## 2008-09-01 LAB — CONVERTED CEMR LAB: IgE (Immunoglobulin E), Serum: 159.8 intl units/mL (ref 0.0–180.0)

## 2008-09-19 ENCOUNTER — Ambulatory Visit: Payer: Self-pay | Admitting: Pulmonary Disease

## 2008-09-19 ENCOUNTER — Encounter: Payer: Self-pay | Admitting: Pulmonary Disease

## 2008-09-24 ENCOUNTER — Telehealth: Payer: Self-pay | Admitting: Internal Medicine

## 2008-09-26 ENCOUNTER — Ambulatory Visit: Payer: Self-pay | Admitting: Internal Medicine

## 2008-09-26 ENCOUNTER — Encounter: Payer: Self-pay | Admitting: Adult Health

## 2008-09-26 ENCOUNTER — Ambulatory Visit: Payer: Self-pay | Admitting: Pulmonary Disease

## 2008-10-01 ENCOUNTER — Ambulatory Visit: Payer: Self-pay | Admitting: Internal Medicine

## 2008-10-02 ENCOUNTER — Telehealth (INDEPENDENT_AMBULATORY_CARE_PROVIDER_SITE_OTHER): Payer: Self-pay | Admitting: *Deleted

## 2008-10-07 ENCOUNTER — Encounter: Payer: Self-pay | Admitting: Internal Medicine

## 2008-10-22 ENCOUNTER — Ambulatory Visit: Payer: Self-pay | Admitting: Internal Medicine

## 2008-10-24 ENCOUNTER — Ambulatory Visit: Payer: Self-pay | Admitting: Critical Care Medicine

## 2008-10-24 ENCOUNTER — Telehealth: Payer: Self-pay | Admitting: Critical Care Medicine

## 2008-10-28 ENCOUNTER — Encounter: Payer: Self-pay | Admitting: Critical Care Medicine

## 2008-10-29 ENCOUNTER — Encounter: Payer: Self-pay | Admitting: Internal Medicine

## 2008-10-29 ENCOUNTER — Telehealth: Payer: Self-pay | Admitting: Critical Care Medicine

## 2008-10-31 ENCOUNTER — Ambulatory Visit: Payer: Self-pay | Admitting: Critical Care Medicine

## 2008-11-13 ENCOUNTER — Encounter: Payer: Self-pay | Admitting: Critical Care Medicine

## 2008-12-02 ENCOUNTER — Ambulatory Visit: Payer: Self-pay | Admitting: Internal Medicine

## 2008-12-10 ENCOUNTER — Ambulatory Visit: Payer: Self-pay | Admitting: Critical Care Medicine

## 2008-12-11 ENCOUNTER — Encounter: Payer: Self-pay | Admitting: Critical Care Medicine

## 2009-01-01 ENCOUNTER — Ambulatory Visit: Payer: Self-pay | Admitting: Critical Care Medicine

## 2009-01-03 DIAGNOSIS — I219 Acute myocardial infarction, unspecified: Secondary | ICD-10-CM

## 2009-01-03 HISTORY — DX: Acute myocardial infarction, unspecified: I21.9

## 2009-01-06 ENCOUNTER — Ambulatory Visit (HOSPITAL_COMMUNITY): Admission: RE | Admit: 2009-01-06 | Discharge: 2009-01-06 | Payer: Self-pay | Admitting: Family Medicine

## 2009-01-29 ENCOUNTER — Ambulatory Visit: Payer: Self-pay | Admitting: Internal Medicine

## 2009-01-29 ENCOUNTER — Ambulatory Visit: Payer: Self-pay | Admitting: Critical Care Medicine

## 2009-02-25 ENCOUNTER — Ambulatory Visit: Payer: Self-pay | Admitting: Internal Medicine

## 2009-03-03 ENCOUNTER — Ambulatory Visit: Payer: Self-pay | Admitting: Internal Medicine

## 2009-03-30 ENCOUNTER — Ambulatory Visit: Payer: Self-pay | Admitting: Critical Care Medicine

## 2009-04-29 ENCOUNTER — Ambulatory Visit: Payer: Self-pay | Admitting: Critical Care Medicine

## 2009-05-27 ENCOUNTER — Ambulatory Visit: Payer: Self-pay | Admitting: Critical Care Medicine

## 2009-06-25 ENCOUNTER — Telehealth (INDEPENDENT_AMBULATORY_CARE_PROVIDER_SITE_OTHER): Payer: Self-pay | Admitting: *Deleted

## 2009-07-03 HISTORY — PX: PORTACATH PLACEMENT: SHX2246

## 2009-07-07 ENCOUNTER — Encounter: Payer: Self-pay | Admitting: Critical Care Medicine

## 2009-07-07 ENCOUNTER — Ambulatory Visit: Payer: Self-pay | Admitting: Oncology

## 2009-07-08 ENCOUNTER — Encounter: Payer: Self-pay | Admitting: Interventional Radiology

## 2009-07-10 ENCOUNTER — Ambulatory Visit (HOSPITAL_COMMUNITY): Admission: RE | Admit: 2009-07-10 | Discharge: 2009-07-10 | Payer: Self-pay | Admitting: Oncology

## 2009-07-24 ENCOUNTER — Ambulatory Visit (HOSPITAL_COMMUNITY): Payer: Self-pay | Admitting: Oncology

## 2009-07-24 ENCOUNTER — Encounter (HOSPITAL_COMMUNITY): Admission: RE | Admit: 2009-07-24 | Discharge: 2009-08-23 | Payer: Self-pay | Admitting: Oncology

## 2009-07-30 ENCOUNTER — Ambulatory Visit (HOSPITAL_COMMUNITY): Admission: RE | Admit: 2009-07-30 | Discharge: 2009-07-30 | Payer: Self-pay | Admitting: General Surgery

## 2009-08-08 ENCOUNTER — Inpatient Hospital Stay (HOSPITAL_COMMUNITY): Admission: EM | Admit: 2009-08-08 | Discharge: 2009-08-13 | Payer: Self-pay | Admitting: Emergency Medicine

## 2009-08-11 ENCOUNTER — Ambulatory Visit: Payer: Self-pay | Admitting: Oncology

## 2009-08-14 ENCOUNTER — Ambulatory Visit (HOSPITAL_COMMUNITY): Payer: Self-pay | Admitting: Internal Medicine

## 2009-08-14 ENCOUNTER — Encounter (HOSPITAL_COMMUNITY)
Admission: RE | Admit: 2009-08-14 | Discharge: 2009-09-13 | Payer: Self-pay | Source: Home / Self Care | Admitting: Internal Medicine

## 2009-08-18 ENCOUNTER — Encounter: Payer: Self-pay | Admitting: Critical Care Medicine

## 2009-08-27 ENCOUNTER — Encounter (HOSPITAL_COMMUNITY)
Admission: RE | Admit: 2009-08-27 | Discharge: 2009-09-26 | Payer: Self-pay | Source: Home / Self Care | Admitting: Oncology

## 2009-09-01 ENCOUNTER — Encounter: Payer: Self-pay | Admitting: Critical Care Medicine

## 2009-09-09 ENCOUNTER — Ambulatory Visit: Payer: Self-pay | Admitting: Critical Care Medicine

## 2009-09-09 DIAGNOSIS — C8590 Non-Hodgkin lymphoma, unspecified, unspecified site: Secondary | ICD-10-CM | POA: Insufficient documentation

## 2009-09-10 ENCOUNTER — Encounter: Payer: Self-pay | Admitting: Critical Care Medicine

## 2009-09-10 ENCOUNTER — Telehealth (INDEPENDENT_AMBULATORY_CARE_PROVIDER_SITE_OTHER): Payer: Self-pay | Admitting: *Deleted

## 2009-09-10 IMAGING — CR DG CHEST 2V
2 series · 2 of 2 positions shown · non-contrast
Comparison: 03/03/2006.

CLINICAL DATA: Shortness of breath.  Asthma.

CHEST - 2 VIEW

[view not recorded (1 of 2)]
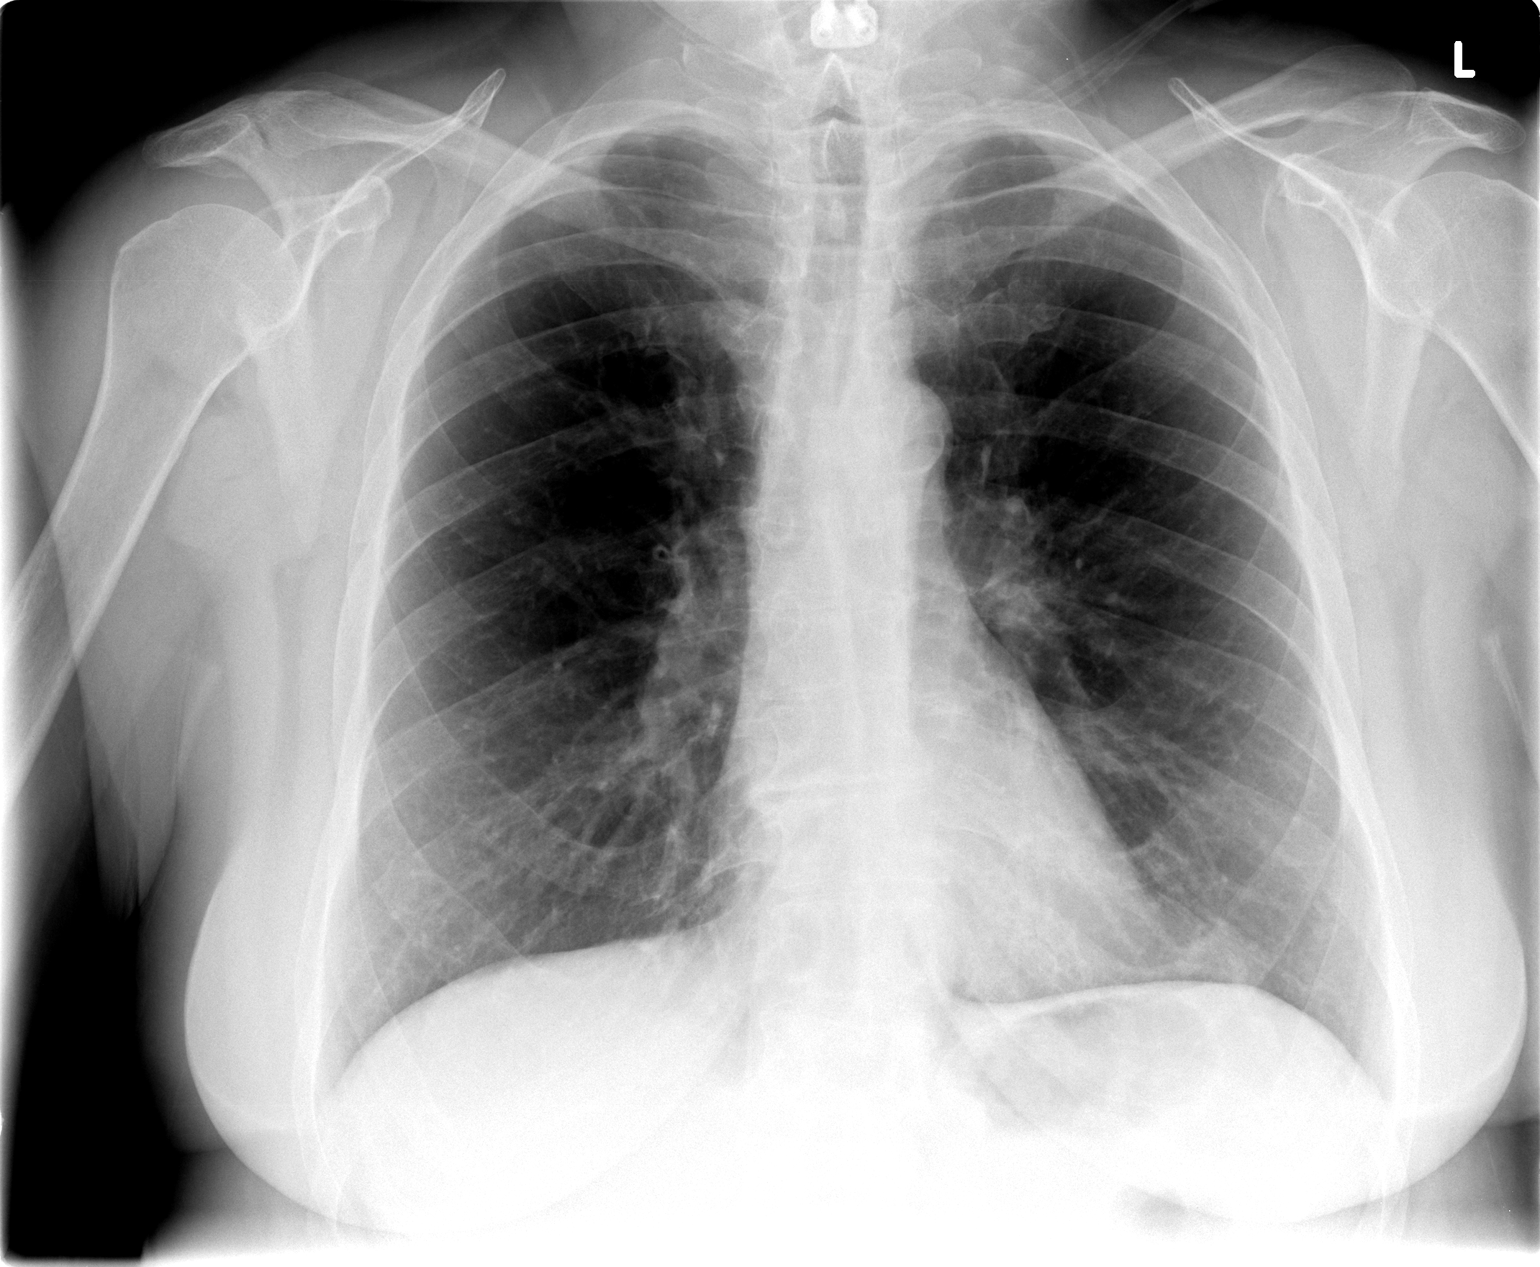

[view not recorded (2 of 2)]
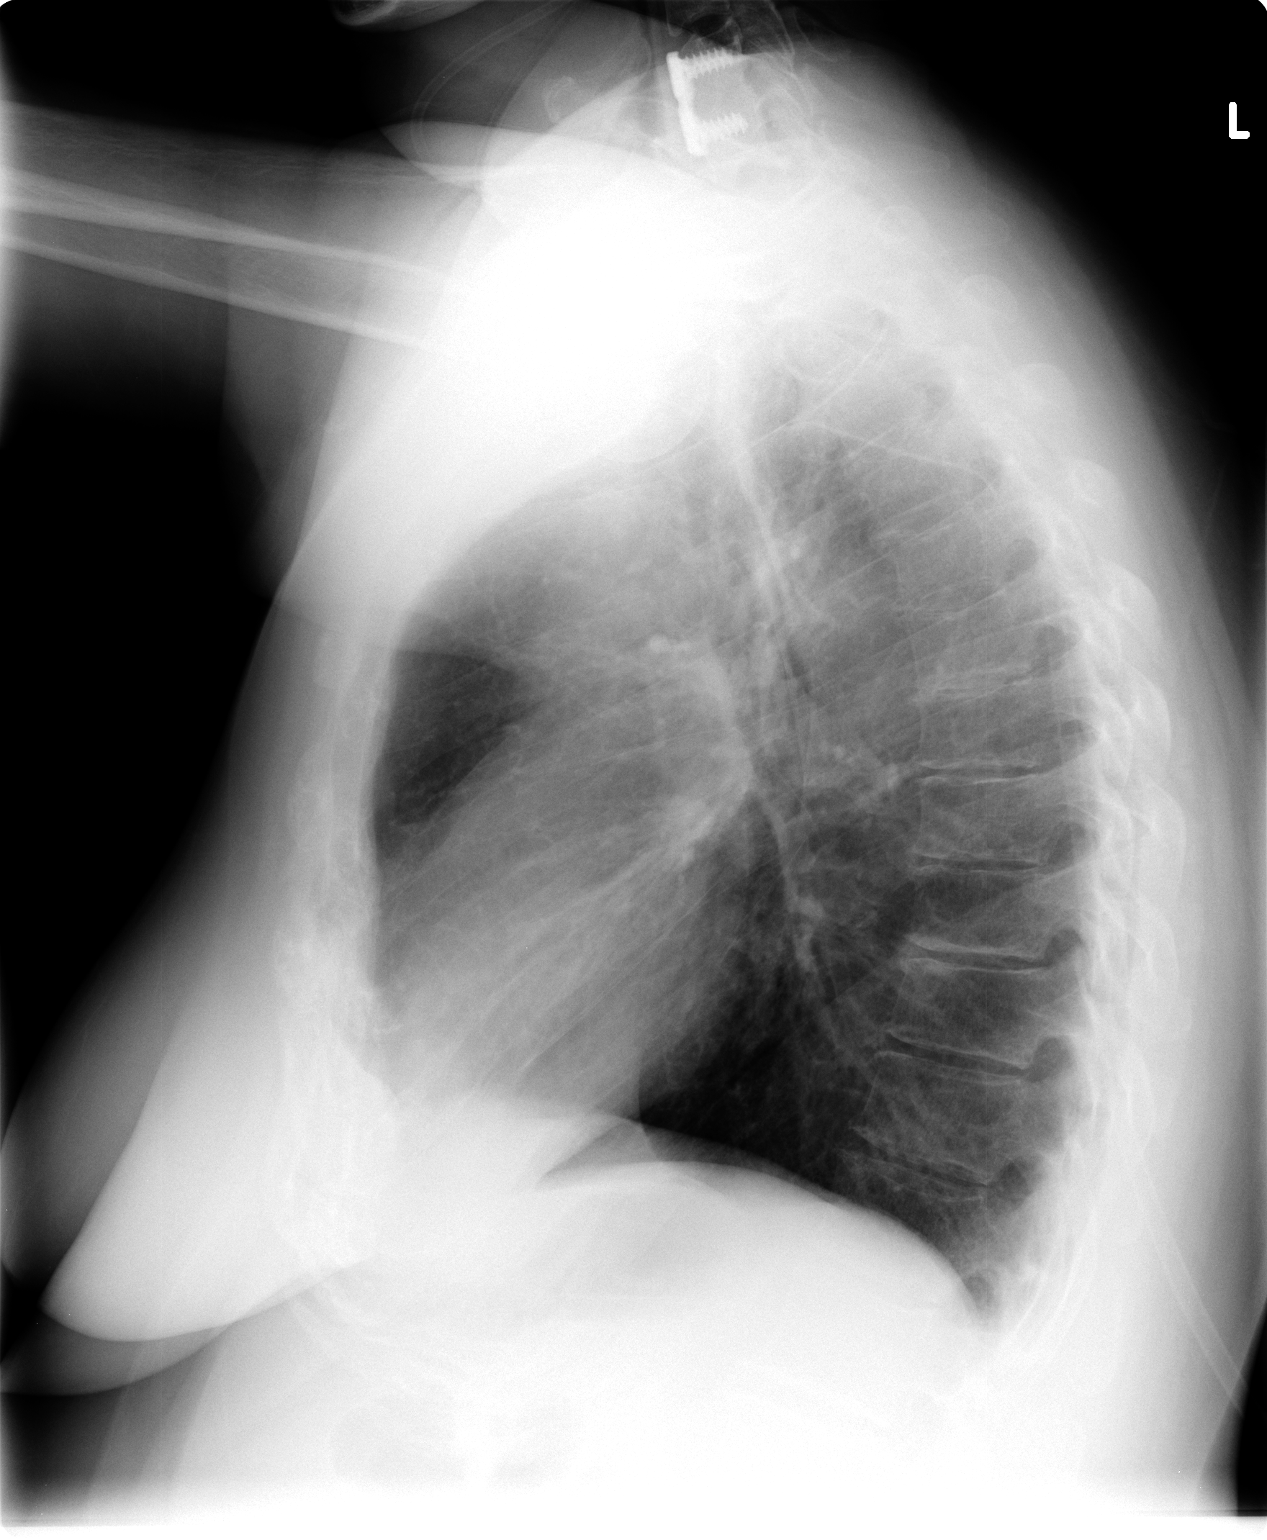

[2 of 2 positions shown; findings below may reference images not displayed]

FINDINGS: The heart size and mediastinal contours are stable.  The
lungs are clear.  There is no significant pulmonary inflation,
pleural effusion or pneumothorax.  Lower cervical fusion is noted.
IMPRESSION: No active cardiopulmonary process.

## 2009-09-13 ENCOUNTER — Inpatient Hospital Stay (HOSPITAL_COMMUNITY): Admission: EM | Admit: 2009-09-13 | Discharge: 2009-09-18 | Payer: Self-pay | Admitting: Emergency Medicine

## 2009-09-14 ENCOUNTER — Ambulatory Visit: Payer: Self-pay | Admitting: Oncology

## 2009-09-20 ENCOUNTER — Telehealth: Payer: Self-pay | Admitting: Critical Care Medicine

## 2009-09-21 ENCOUNTER — Encounter (HOSPITAL_COMMUNITY): Admission: RE | Admit: 2009-09-21 | Discharge: 2009-10-02 | Payer: Self-pay | Admitting: Hematology and Oncology

## 2009-09-21 ENCOUNTER — Ambulatory Visit (HOSPITAL_COMMUNITY): Payer: Self-pay | Admitting: Hematology and Oncology

## 2009-09-30 ENCOUNTER — Ambulatory Visit (HOSPITAL_COMMUNITY): Admission: RE | Admit: 2009-09-30 | Discharge: 2009-09-30 | Payer: Self-pay | Admitting: Oncology

## 2009-10-01 ENCOUNTER — Ambulatory Visit (HOSPITAL_COMMUNITY): Payer: Self-pay | Admitting: Oncology

## 2009-10-04 IMAGING — CT CT PARANASAL SINUSES LIMITED
1 series · 16 of 30 positions shown, 20 images · non-contrast
Comparison: Limited CT sinus of 01/31/2005

CLINICAL DATA: Short of breath, asthma

CT PARANASAL SINUS LIMITED WITHOUT CONTRAST

[Series 3: sinus_supine 3.0 h30s bone · axial · 0.29mm/px · z∈[-164,-65]mm · 16 of 37 slices shown, 20 images]
[im 2/37  brain]
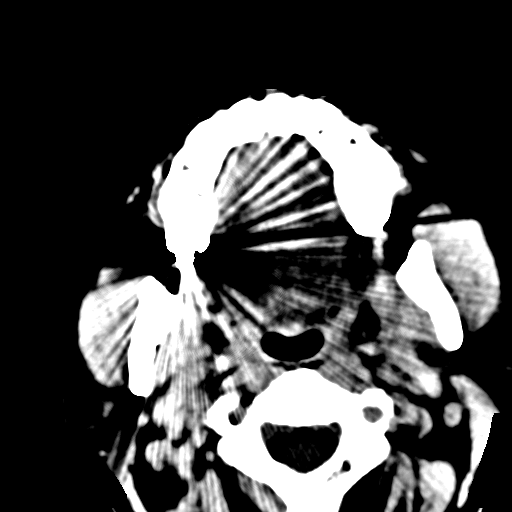
[im 2/37  bone]
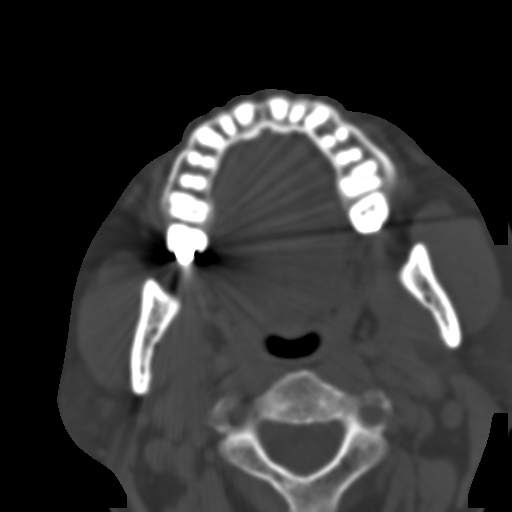
[im 4/37  bone]
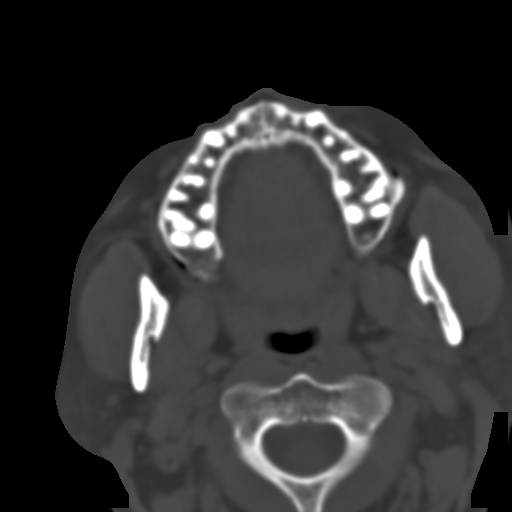
[im 7/37  bone]
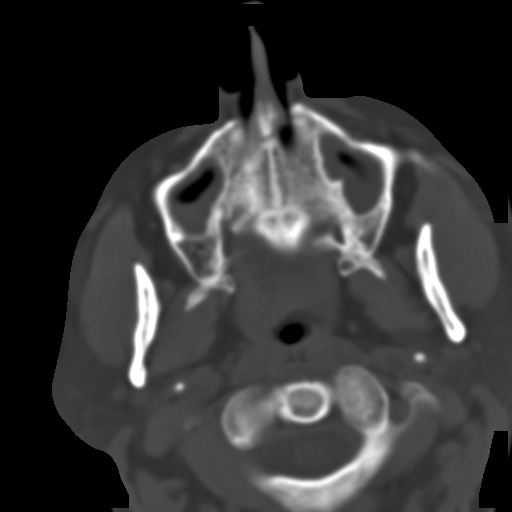
[im 9/37  bone]
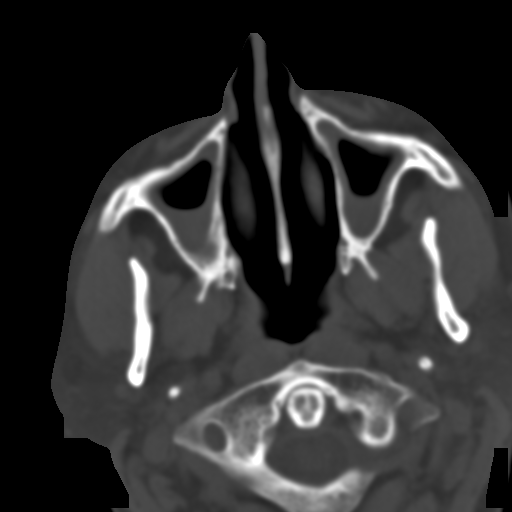
[im 10/37  brain]
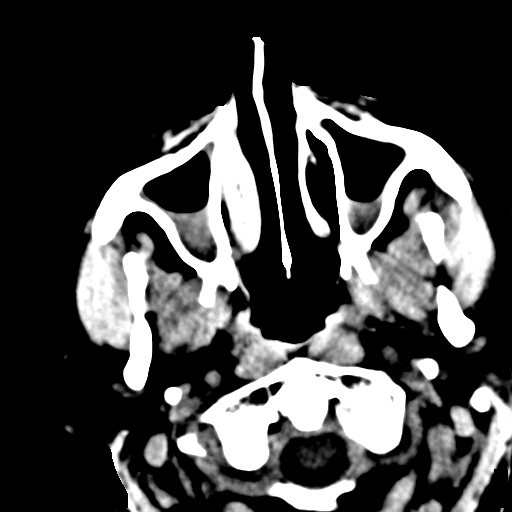
[im 10/37  bone]
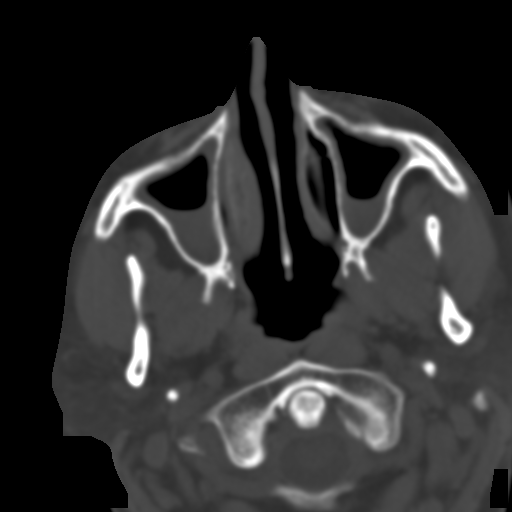
[im 13/37  bone]
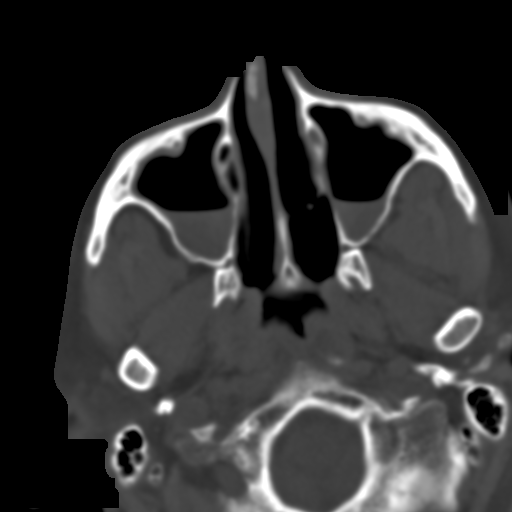
[im 15/37  bone]
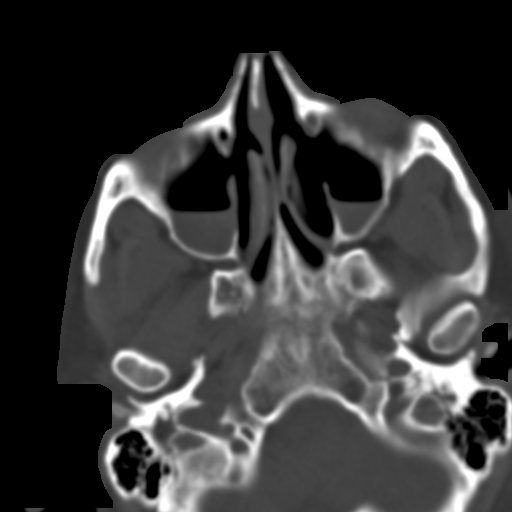
[im 18/37  bone]
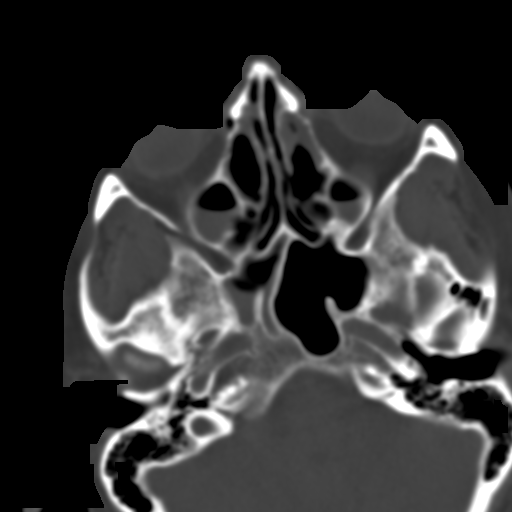
[im 19/37  brain]
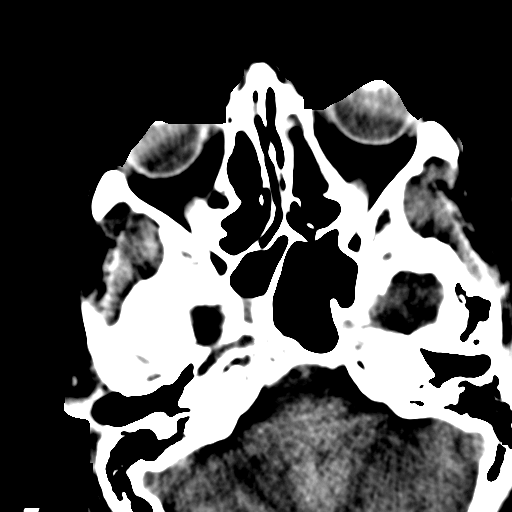
[im 19/37  bone]
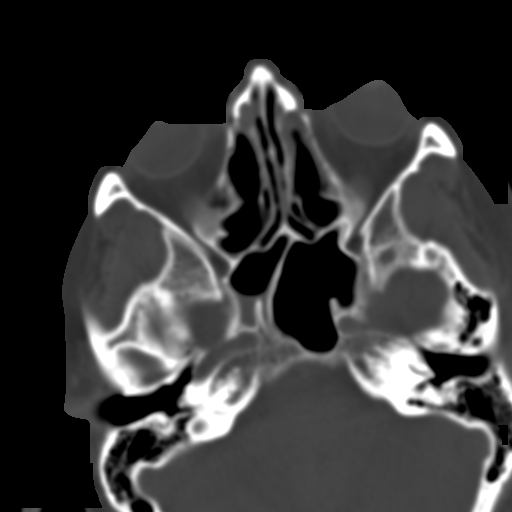
[im 22/37  bone]
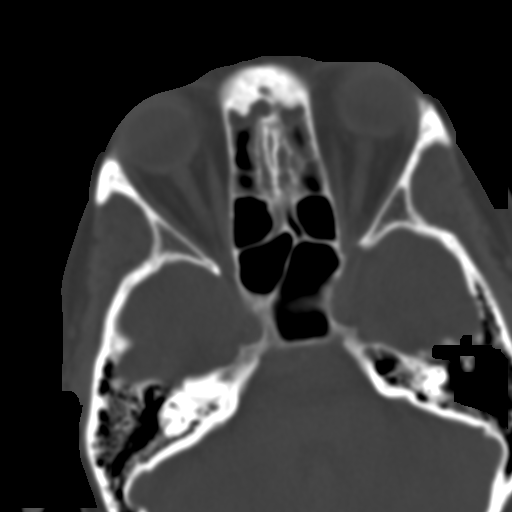
[im 24/37  bone]
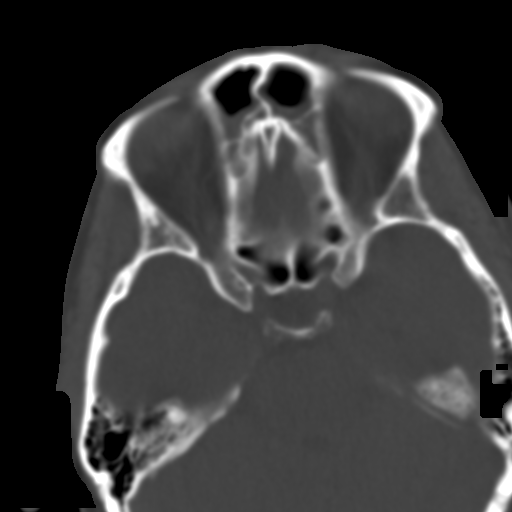
[im 27/37  bone]
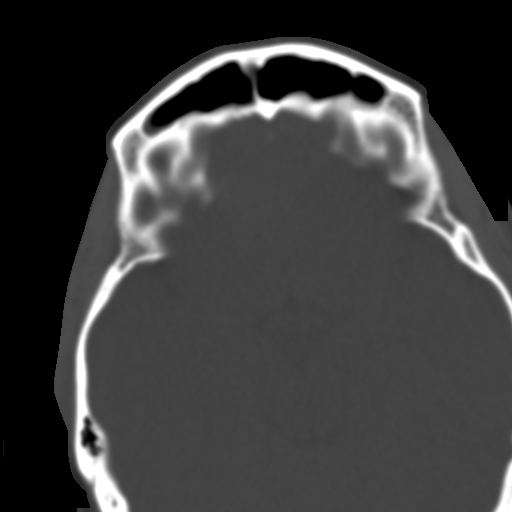
[im 28/37  brain]
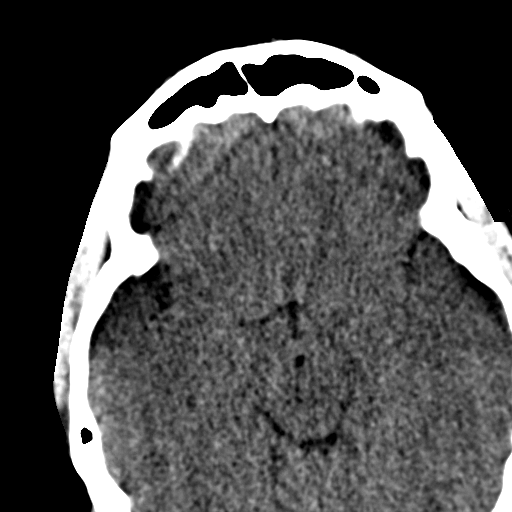
[im 28/37  bone]
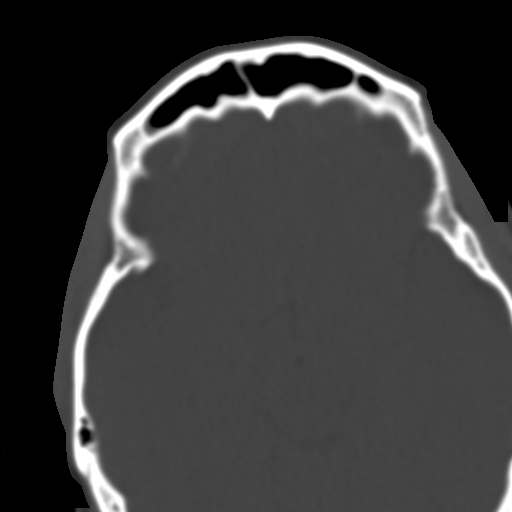
[im 30/37  bone]
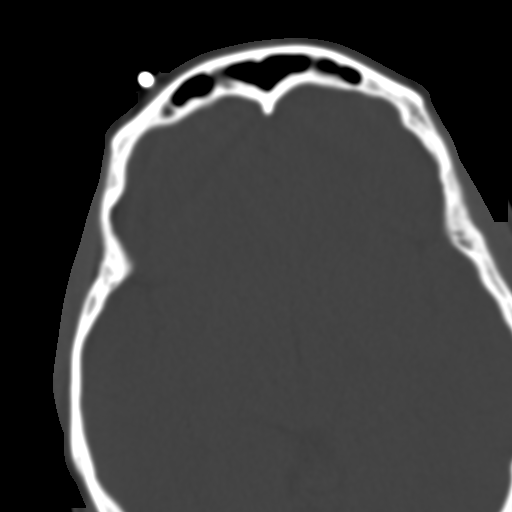
[im 33/37  bone]
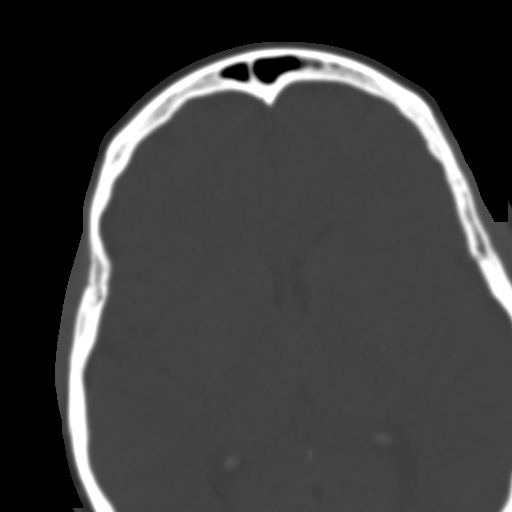
[im 35/37  bone]
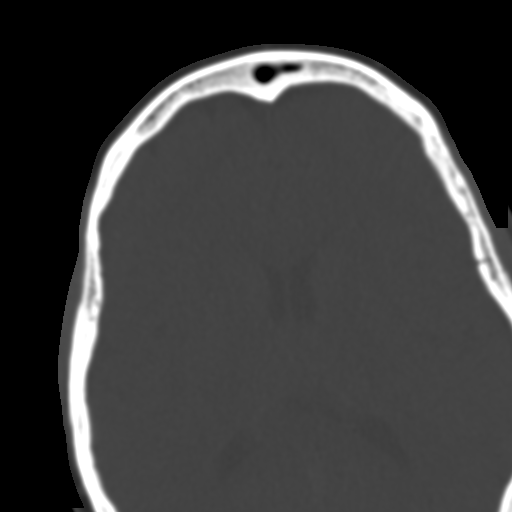

[16 of 30 positions shown; findings below may reference images not displayed]

FINDINGS: There are air-fluid level is seen both maxillary sinuses.
There is also some opacification of ethmoid air cells with mild
mucosal thickening in the frontal sinus.  No bony abnormality is
seen.  Some fluid does layer in the right partition of the sphenoid
sinus as well. The nasal turbinates appear to be normal in position
and the nasal airway is patent.
IMPRESSION: Bilateral maxillary as well as ethmoid sinusitis with some fluid in
the right sphenoid sinus and mucosal thickening in the frontal
sinus.

REF:G1 DICTATED: 02/12/2008 [DATE]

## 2009-10-04 IMAGING — CR DG CHEST 2V
2 series · 2 of 2 positions shown · non-contrast
Comparison: Chest x-ray of 01/19/2008

CLINICAL DATA: Short of breath, asthma

CHEST - 2 VIEW

[w chest pa]
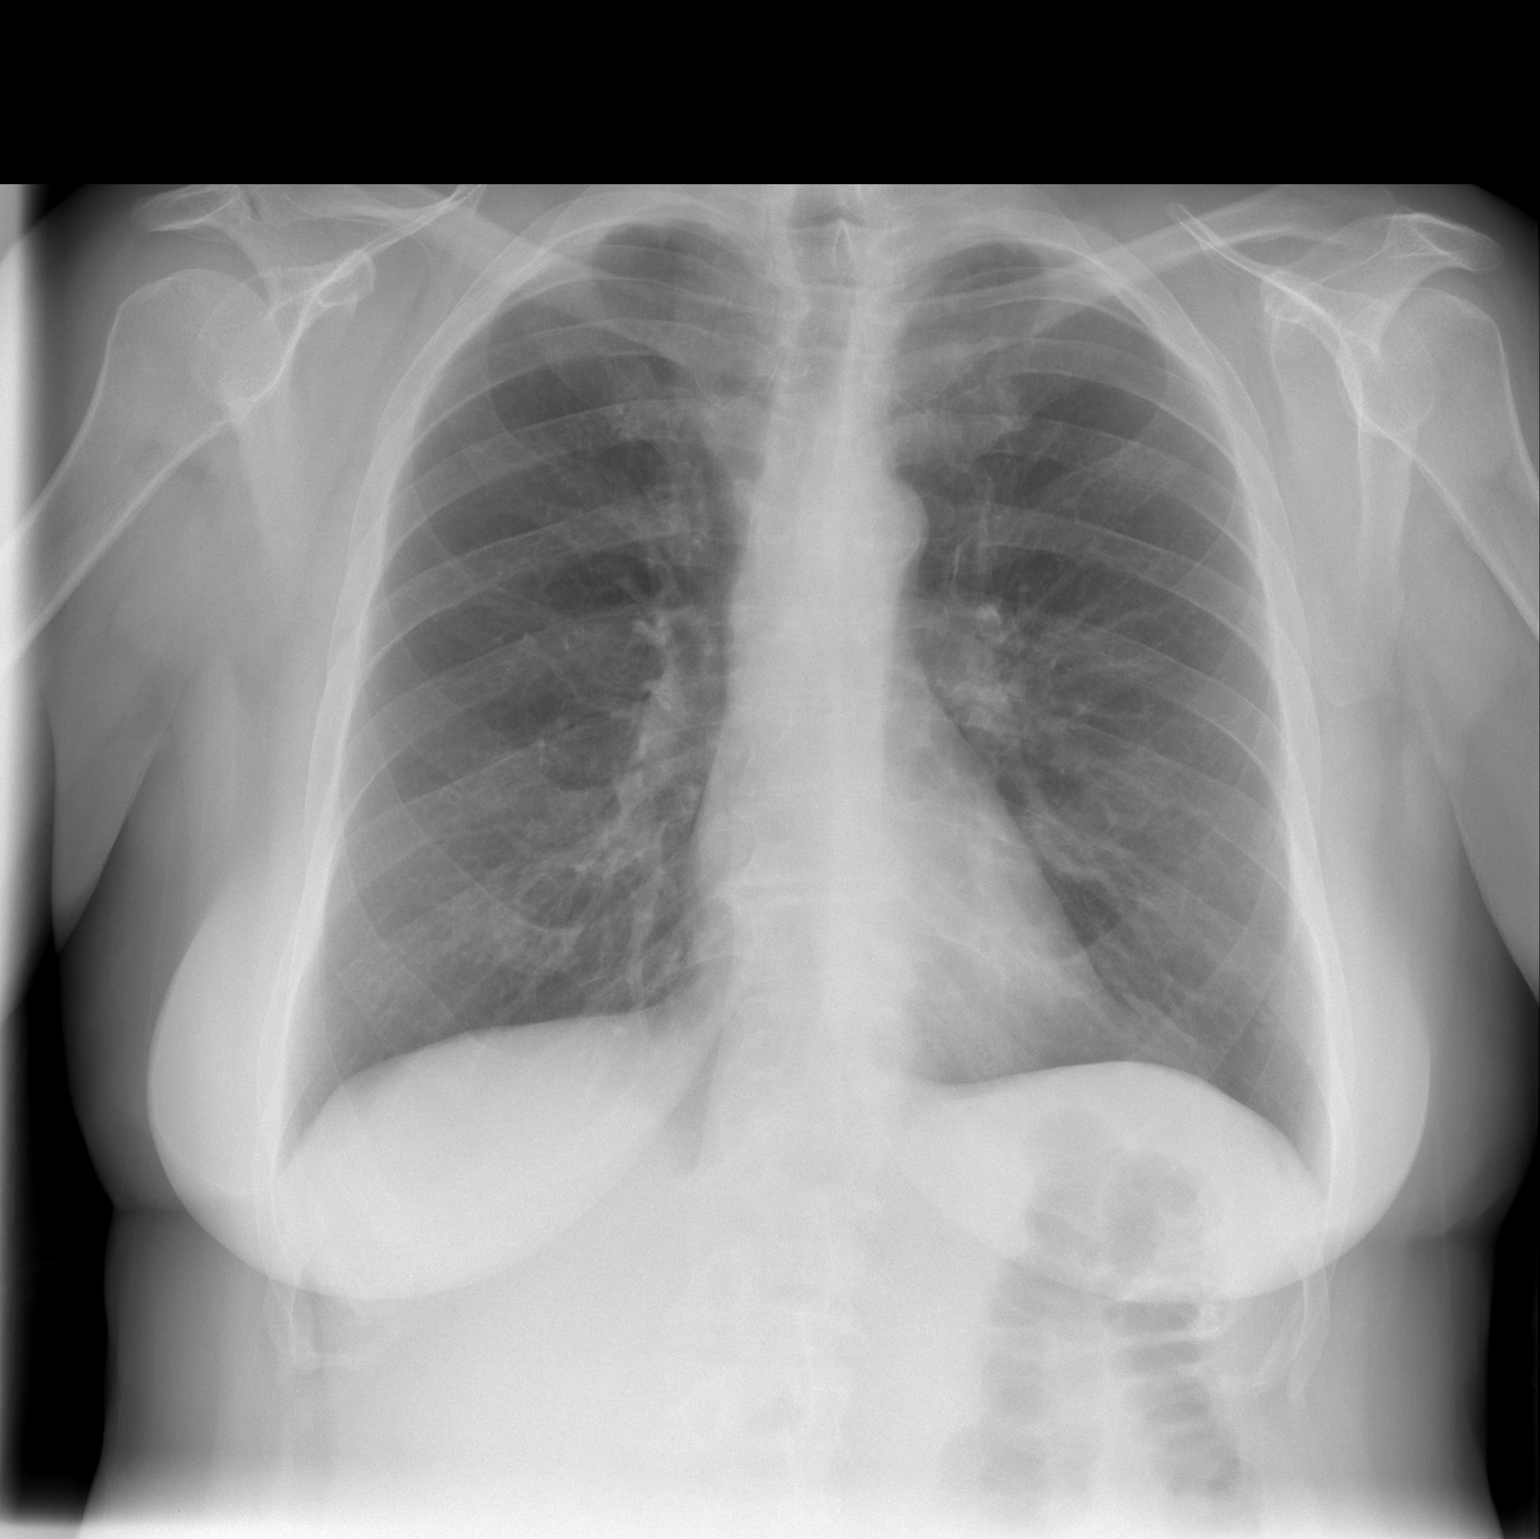

[w chest lat]
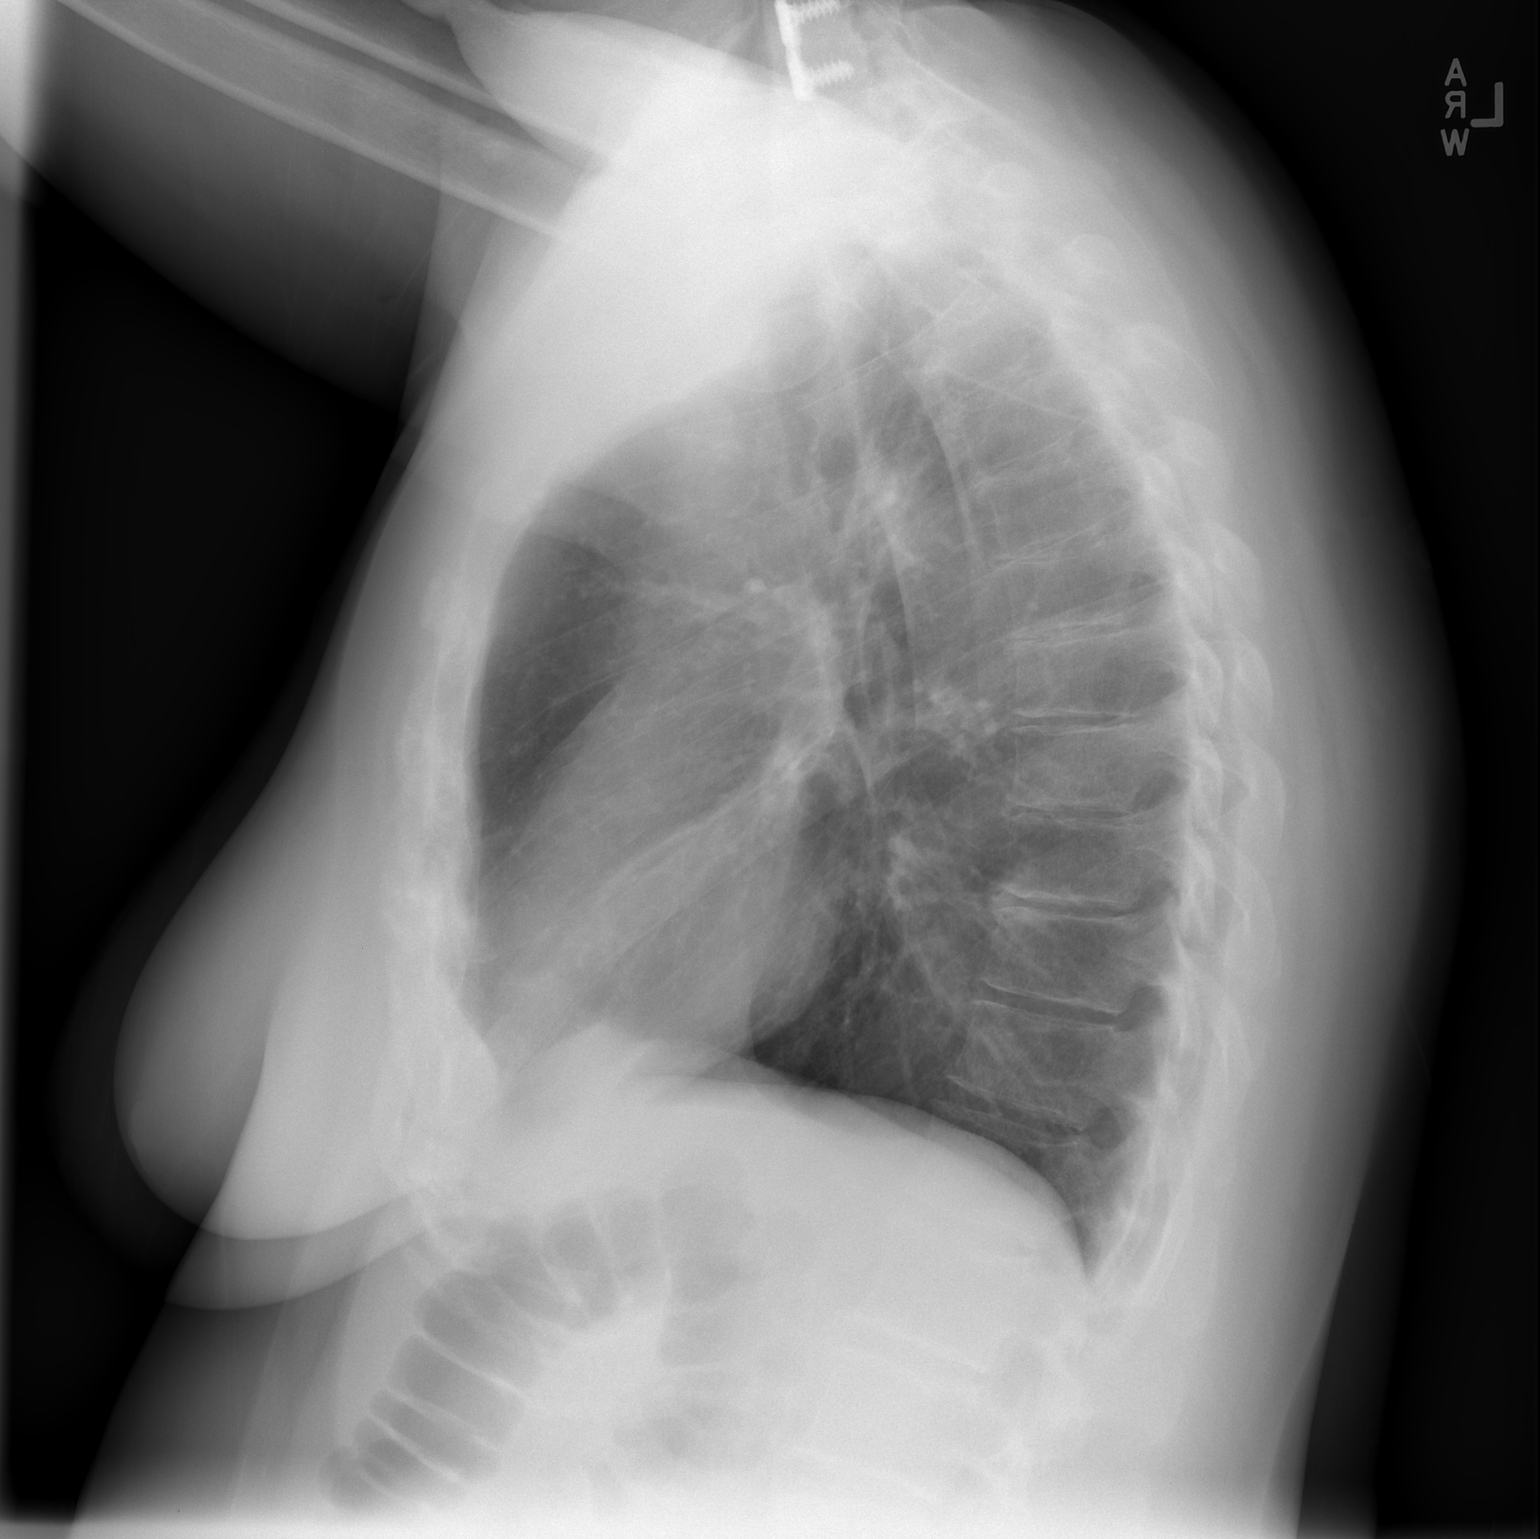

[2 of 2 positions shown; findings below may reference images not displayed]

FINDINGS: The lungs are clear.  There is mild peribronchial
thickening present.  The heart is within normal limits in size.  No
bony abnormality is seen. A lower anterior cervical spine fusion
plate is present.
IMPRESSION: No active lung disease.

REF:G1 DICTATED: 02/12/2008 [DATE]

## 2009-10-05 IMAGING — RF DG UGI W/ HIGH DENSITY W/KUB
19 of 20 series · 19 of 20 positions shown · IV contrast (agent unspecified)
Comparison: No priors

CLINICAL DATA: Chest pain/dysphagia/indigestion

UPPER GI SERIES W/HIGH DENSITY W/KUB
TECHNIQUE: After obtaining a scout radiograph, upper GI series
performed with high density barium and effervescent agent. Thin
barium also used.
Fluoroscopy Time: 2.4 minutes
Contrast: Air contrast

[Series 1: run · 1 of 1 slices shown (1 of 19)]
[im 1/1]
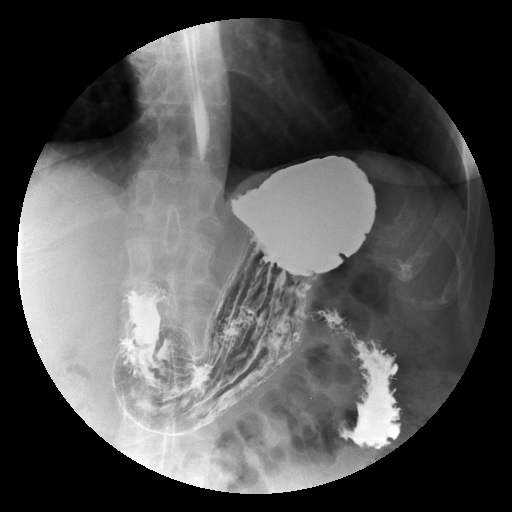

[Series 2: run · 1 of 1 slices shown (2 of 19)]
[im 1/1]
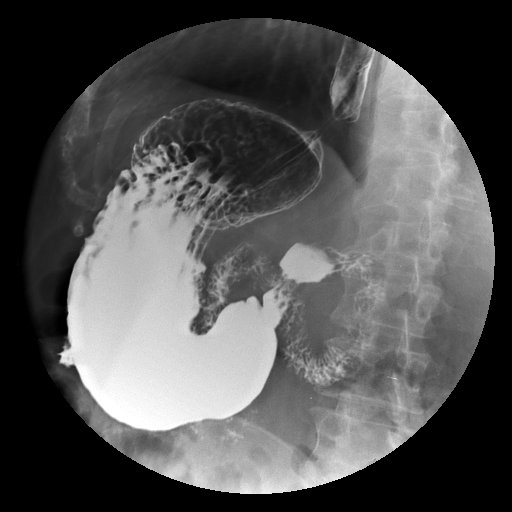

[Series 3: run · 1 of 1 slices shown (3 of 19)]
[im 1/1]
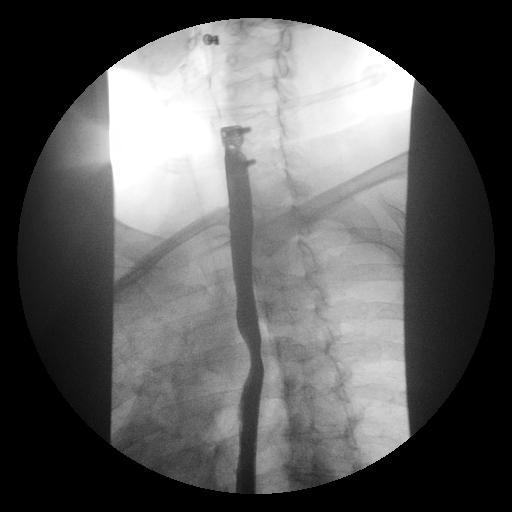

[Series 4: run · 1 of 1 slices shown (4 of 19)]
[im 1/1]
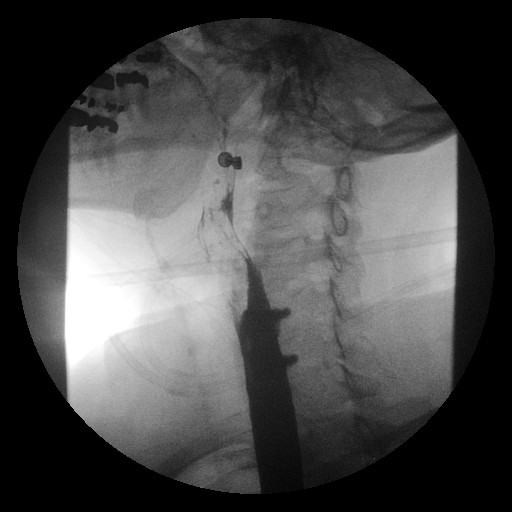

[Series 5: run · 1 of 1 slices shown (5 of 19)]
[im 1/1]
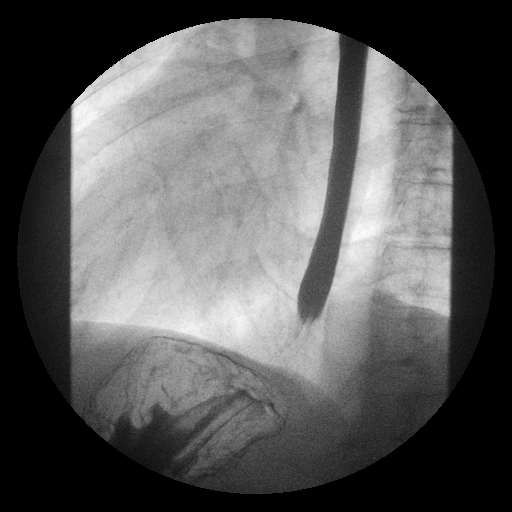

[Series 6: run · 1 of 1 slices shown (6 of 19)]
[im 1/1]
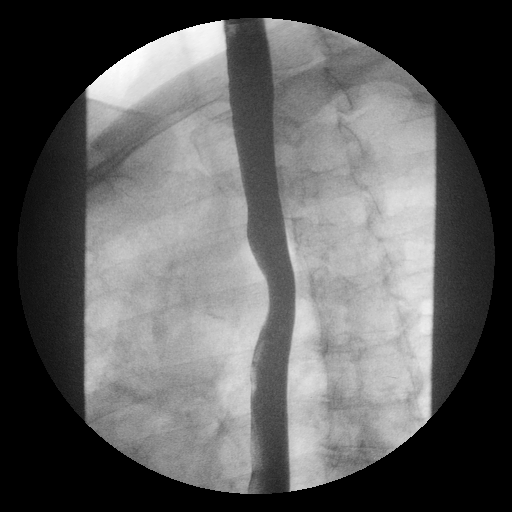

[Series 7: run · 1 of 1 slices shown (7 of 19)]
[im 1/1]
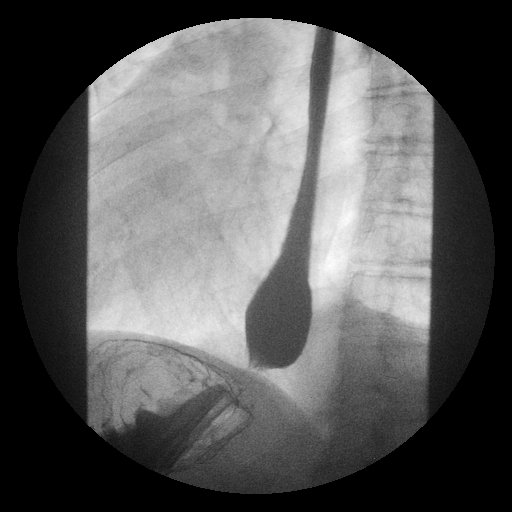

[Series 8: run · 1 of 1 slices shown (8 of 19)]
[im 1/1]
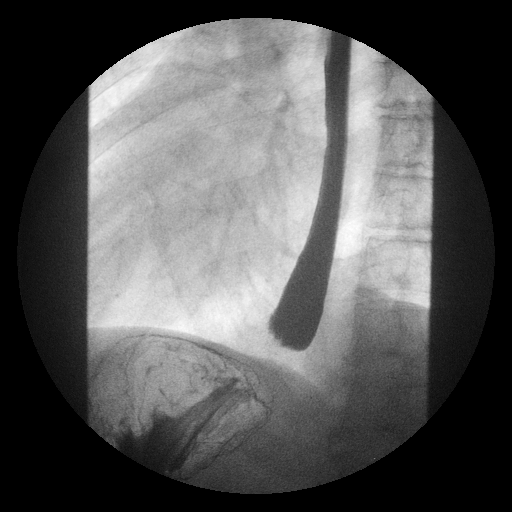

[Series 9: run · 1 of 1 slices shown (9 of 19)]
[im 1/1]
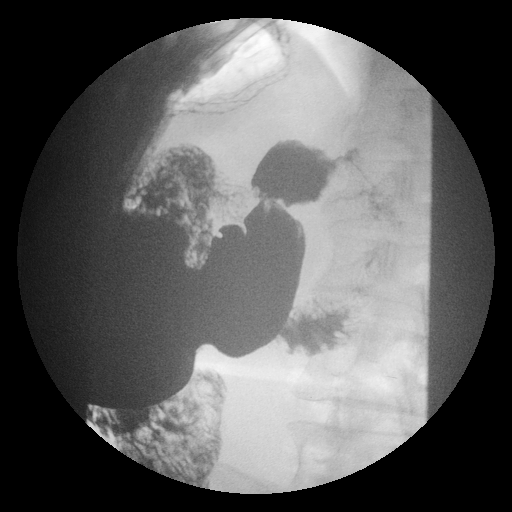

[Series 11: run · 1 of 1 slices shown (10 of 19)]
[im 1/1]
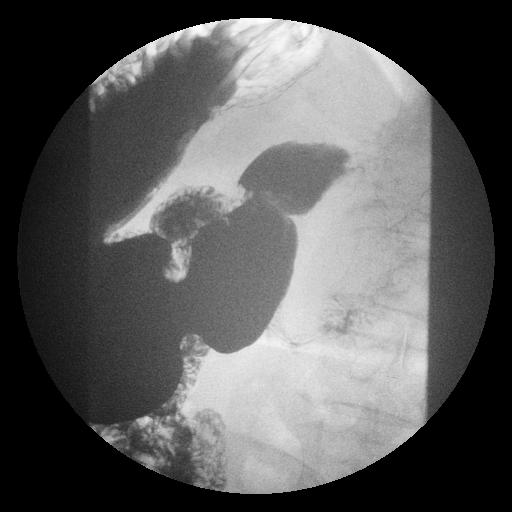

[Series 12: run · 1 of 1 slices shown (11 of 19)]
[im 1/1]
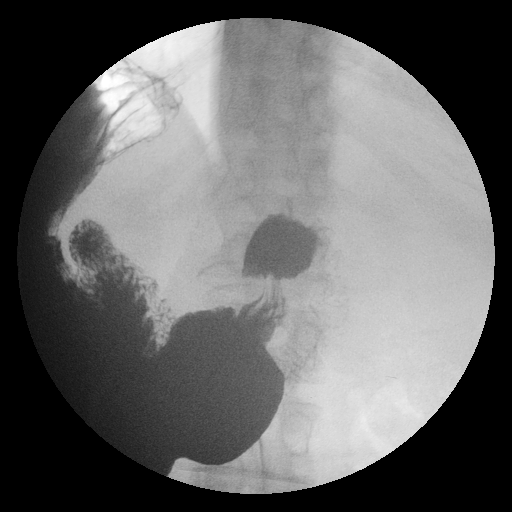

[Series 13: run · 1 of 1 slices shown (12 of 19)]
[im 1/1]
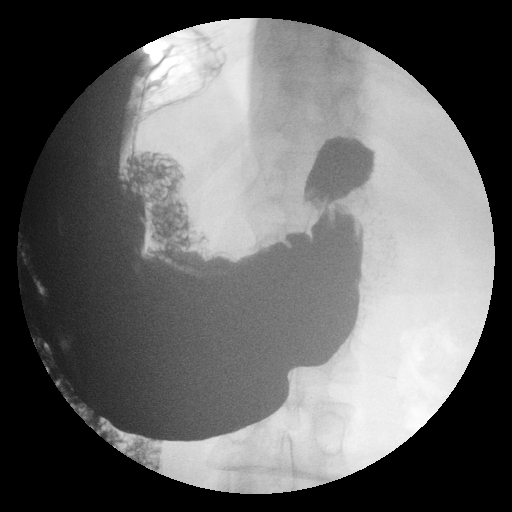

[Series 14: run · 1 of 1 slices shown (13 of 19)]
[im 1/1]
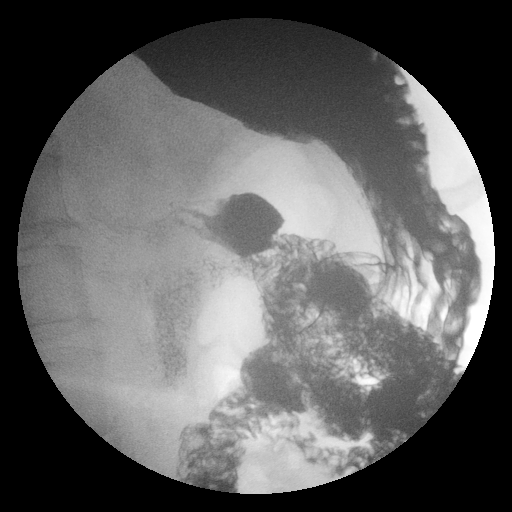

[Series 15: run · 1 of 1 slices shown (14 of 19)]
[im 1/1]
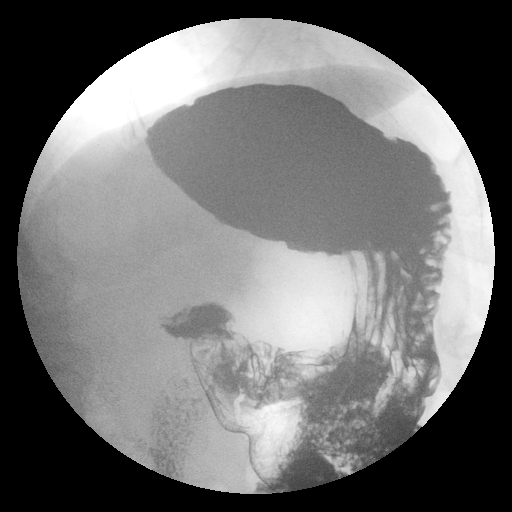

[Series 16: run · 1 of 1 slices shown (15 of 19)]
[im 1/1]
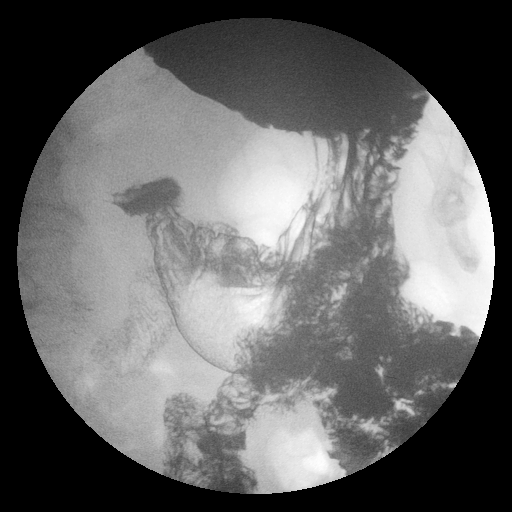

[Series 17: run · 1 of 1 slices shown (16 of 19)]
[im 1/1]
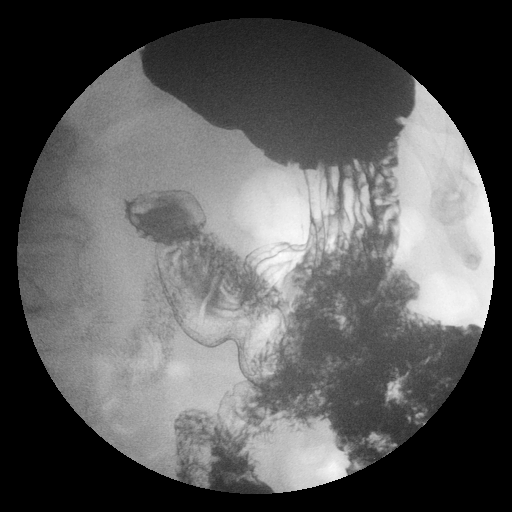

[Series 18: run · 1 of 1 slices shown (17 of 19)]
[im 1/1]
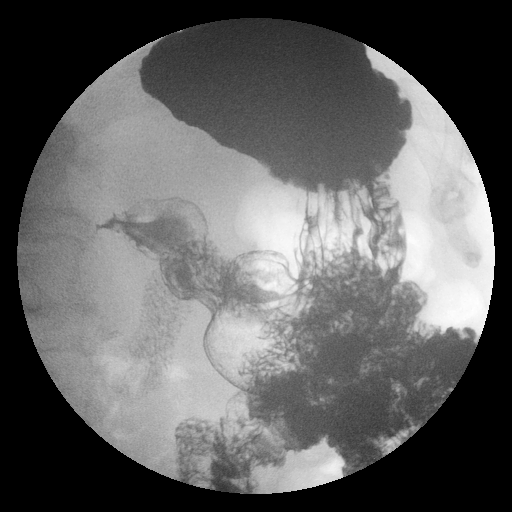

[Series 19: run · 1 of 1 slices shown (18 of 19)]
[im 1/1]
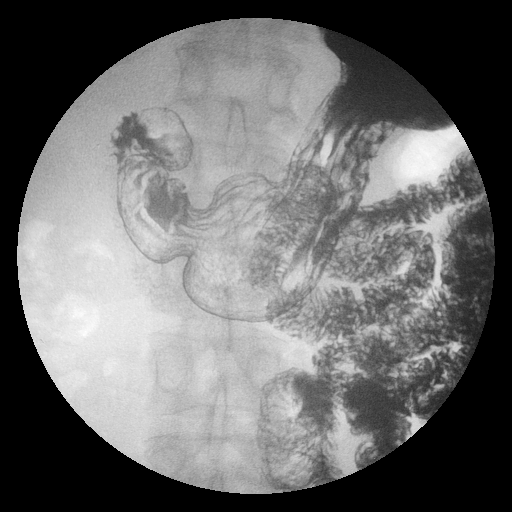

[Series 20: run · 1 of 1 slices shown (19 of 19)]
[im 1/1]
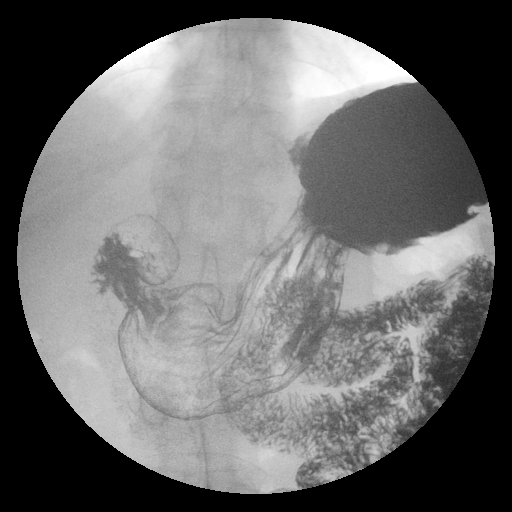

[19 of 20 positions shown; findings below may reference images not displayed]

FINDINGS: Scout KUB unremarkable.  Swallowing mechanism normal with
no penetration or aspiration.  No esophageal lesions.  No hiatal
hernia or reflux demonstrated with Valsalva or water siphon
maneuvers.  No stricture or esophagitis.

Stomach, duodenal bulb, and proximal small bowel that is visualized
appear normal.  Ligament of Treitz anatomical.
IMPRESSION: No pathological findings.  Specifically no evidence for hiatal
hernia, reflux, esophagitis or stricture.

REF:W2 DICTATED: 02/13/2008 [DATE]

## 2009-10-10 IMAGING — MG MM DIGITAL SCREENING
5 series · 5 of 5 positions shown · non-contrast
Comparison: Prior studies.

DG SCREEN MAMMOGRAM BILATERAL
Bilateral CC and MLO view(s) were taken.

DIGITAL SCREENING MAMMOGRAM WITH CAD:

[L CC]
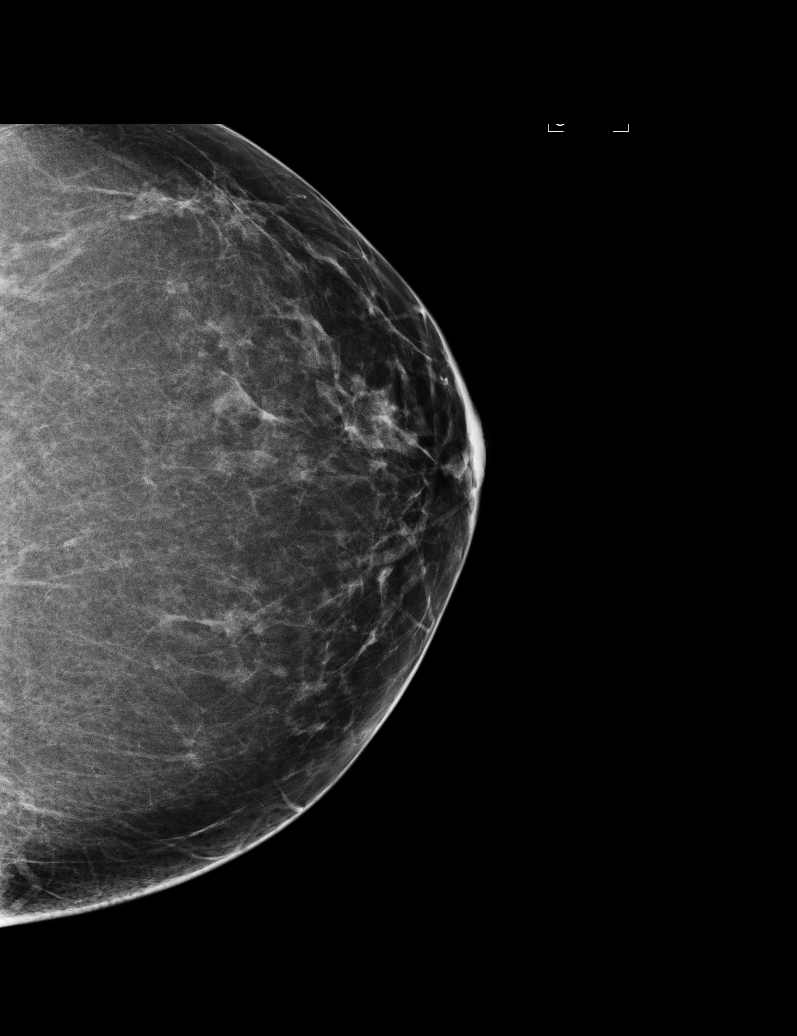

[L MLO]
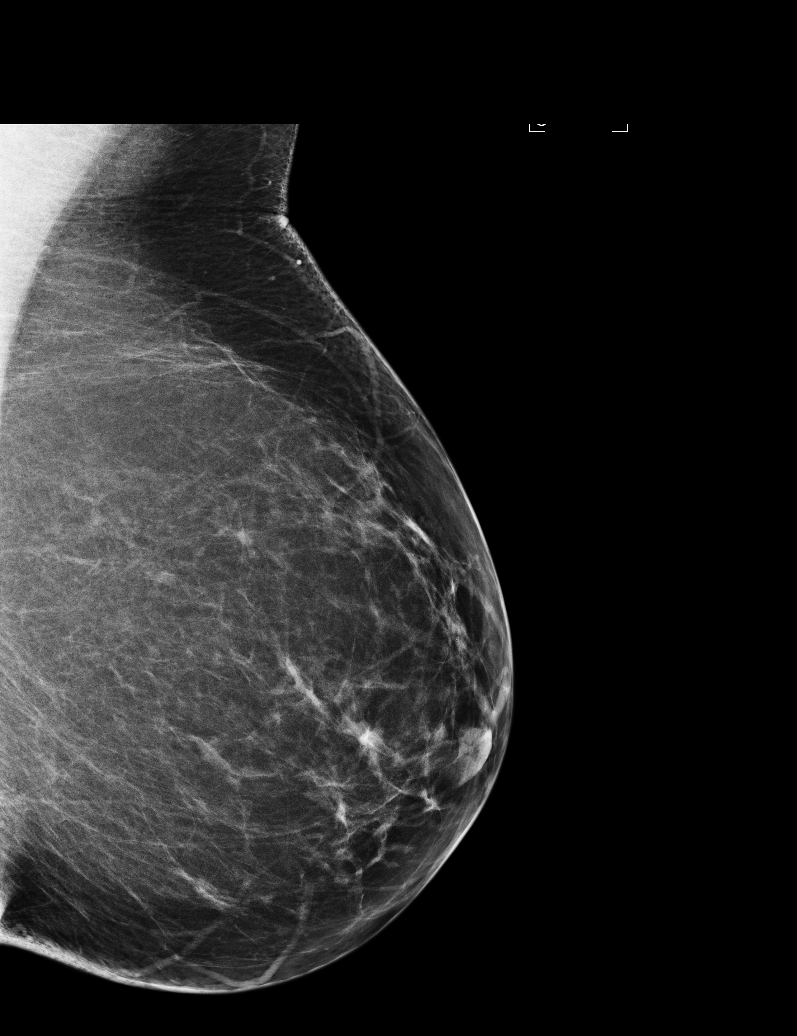

[R CC]
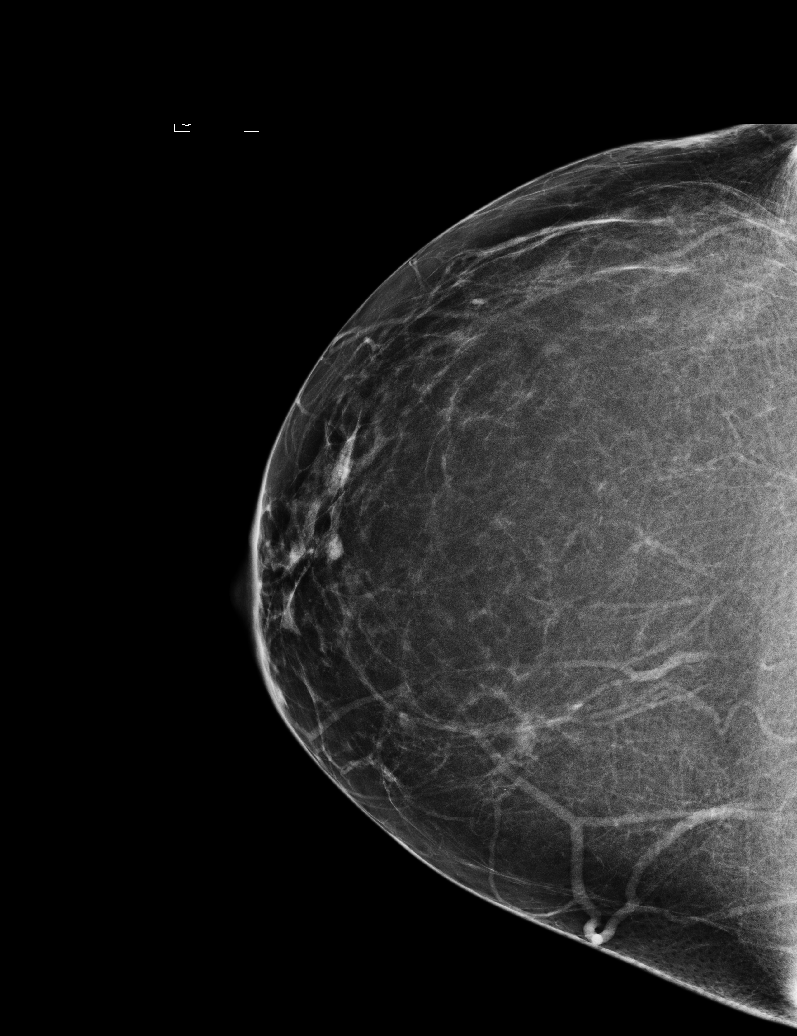

[R MLO (1 of 2)]
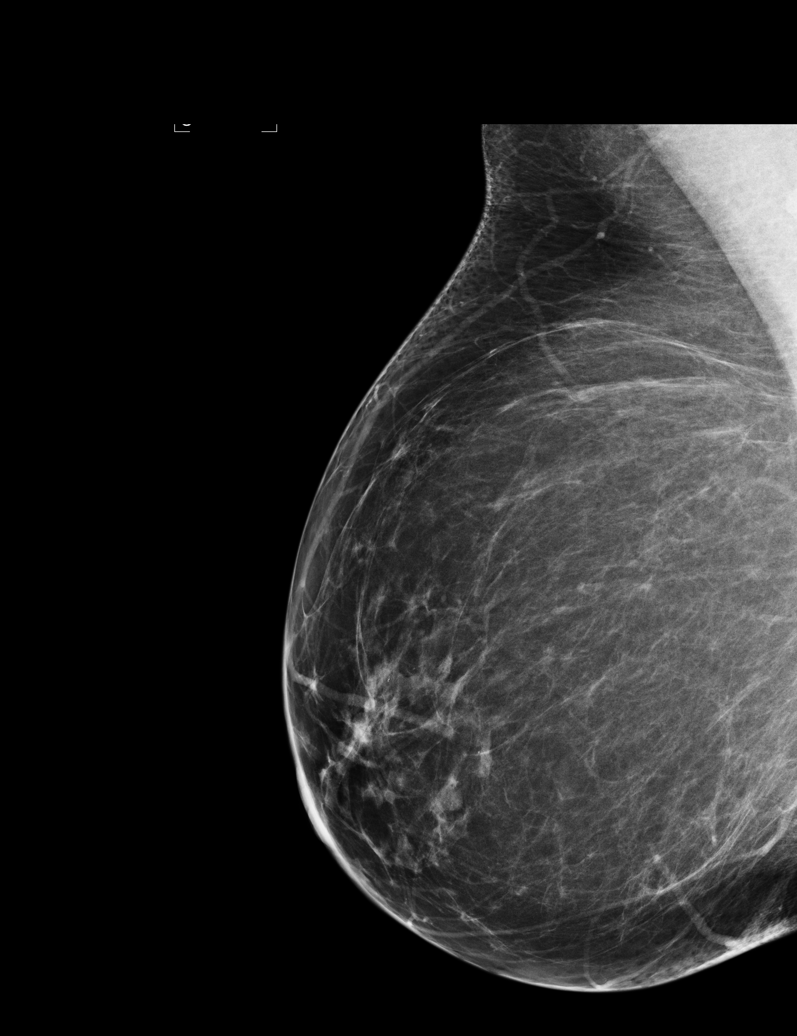

[R MLO (2 of 2)]
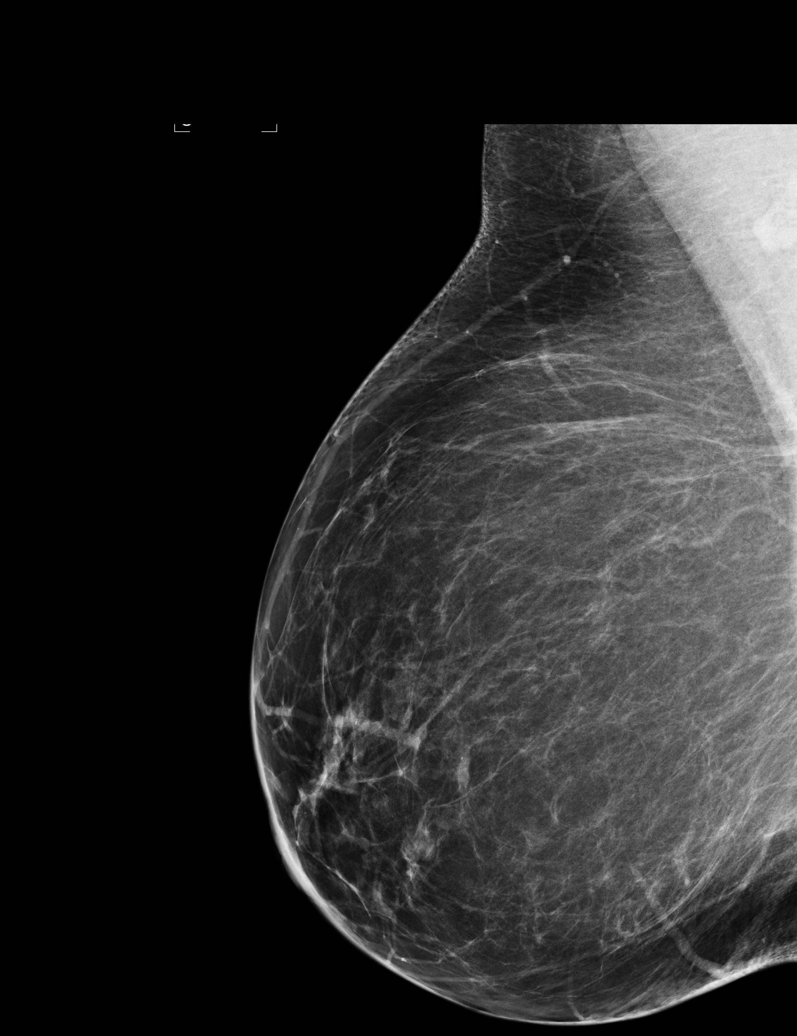

[5 of 5 positions shown; findings below may reference images not displayed]

The breast tissue is almost entirely fatty.  There is no dominant mass, architectural distortion or
calcification to suggest malignancy.

Images were processed with CAD.
IMPRESSION: No mammographic evidence of malignancy.  Suggest yearly screening mammography.

A result letter of this screening mammogram will be mailed directly to the patient.

ASSESSMENT: Negative - BI-RADS 1

Screening mammogram in 1 year.
THIS WAS ANALAYZED BY COMPUTER AIDED DETECTION. , THIS PROCEDURE WAS A DIGITAL MAMMOGRAM.

## 2009-10-15 ENCOUNTER — Ambulatory Visit (HOSPITAL_COMMUNITY): Admission: RE | Admit: 2009-10-15 | Discharge: 2009-10-15 | Payer: Self-pay | Admitting: Oncology

## 2009-10-16 ENCOUNTER — Encounter (HOSPITAL_COMMUNITY)
Admission: RE | Admit: 2009-10-16 | Discharge: 2009-11-15 | Payer: Self-pay | Source: Home / Self Care | Admitting: Oncology

## 2009-10-21 ENCOUNTER — Ambulatory Visit (HOSPITAL_COMMUNITY): Admission: RE | Admit: 2009-10-21 | Discharge: 2009-10-21 | Payer: Self-pay | Admitting: Oncology

## 2009-10-31 ENCOUNTER — Ambulatory Visit: Payer: Self-pay | Admitting: Internal Medicine

## 2009-10-31 ENCOUNTER — Ambulatory Visit: Payer: Self-pay | Admitting: Critical Care Medicine

## 2009-10-31 ENCOUNTER — Inpatient Hospital Stay (HOSPITAL_COMMUNITY): Admission: EM | Admit: 2009-10-31 | Discharge: 2009-12-03 | Payer: Self-pay | Admitting: Pulmonary Disease

## 2009-10-31 ENCOUNTER — Ambulatory Visit: Payer: Self-pay | Admitting: Cardiology

## 2009-10-31 ENCOUNTER — Ambulatory Visit: Payer: Self-pay | Admitting: Infectious Diseases

## 2009-10-31 ENCOUNTER — Ambulatory Visit: Payer: Self-pay | Admitting: Oncology

## 2009-11-02 ENCOUNTER — Encounter: Payer: Self-pay | Admitting: Family Medicine

## 2009-11-02 ENCOUNTER — Encounter (INDEPENDENT_AMBULATORY_CARE_PROVIDER_SITE_OTHER): Payer: Self-pay | Admitting: Pulmonary Disease

## 2009-11-05 ENCOUNTER — Ambulatory Visit: Payer: Self-pay | Admitting: Vascular Surgery

## 2009-11-05 ENCOUNTER — Encounter: Payer: Self-pay | Admitting: Family Medicine

## 2009-11-06 ENCOUNTER — Encounter: Payer: Self-pay | Admitting: Family Medicine

## 2009-11-06 ENCOUNTER — Encounter: Payer: Self-pay | Admitting: Internal Medicine

## 2009-11-11 ENCOUNTER — Encounter (INDEPENDENT_AMBULATORY_CARE_PROVIDER_SITE_OTHER): Payer: Self-pay | Admitting: Pulmonary Disease

## 2009-11-12 ENCOUNTER — Encounter (INDEPENDENT_AMBULATORY_CARE_PROVIDER_SITE_OTHER): Payer: Self-pay | Admitting: Pulmonary Disease

## 2009-11-17 ENCOUNTER — Encounter (HOSPITAL_COMMUNITY): Admission: RE | Admit: 2009-11-17 | Payer: Self-pay | Admitting: Oncology

## 2009-12-01 ENCOUNTER — Ambulatory Visit: Payer: Self-pay | Admitting: Physical Medicine & Rehabilitation

## 2009-12-03 ENCOUNTER — Inpatient Hospital Stay (HOSPITAL_COMMUNITY)
Admission: RE | Admit: 2009-12-03 | Discharge: 2009-12-10 | Payer: Self-pay | Attending: Physical Medicine & Rehabilitation | Admitting: Physical Medicine & Rehabilitation

## 2009-12-10 ENCOUNTER — Telehealth: Payer: Self-pay | Admitting: Critical Care Medicine

## 2009-12-10 ENCOUNTER — Inpatient Hospital Stay (HOSPITAL_COMMUNITY): Admission: EM | Admit: 2009-12-10 | Discharge: 2009-07-10 | Payer: Self-pay | Admitting: Emergency Medicine

## 2009-12-14 ENCOUNTER — Ambulatory Visit: Payer: Self-pay | Admitting: Critical Care Medicine

## 2009-12-14 ENCOUNTER — Encounter: Payer: Self-pay | Admitting: Adult Health

## 2009-12-30 ENCOUNTER — Ambulatory Visit: Payer: Self-pay | Admitting: Critical Care Medicine

## 2009-12-30 DIAGNOSIS — D801 Nonfamilial hypogammaglobulinemia: Secondary | ICD-10-CM

## 2009-12-30 HISTORY — DX: Nonfamilial hypogammaglobulinemia: D80.1

## 2009-12-31 ENCOUNTER — Encounter (HOSPITAL_COMMUNITY)
Admission: RE | Admit: 2009-12-31 | Discharge: 2010-01-01 | Payer: Self-pay | Source: Home / Self Care | Attending: Critical Care Medicine | Admitting: Critical Care Medicine

## 2009-12-31 ENCOUNTER — Ambulatory Visit (HOSPITAL_COMMUNITY)
Admission: RE | Admit: 2009-12-31 | Discharge: 2009-12-31 | Payer: Self-pay | Source: Home / Self Care | Attending: Critical Care Medicine | Admitting: Critical Care Medicine

## 2009-12-31 ENCOUNTER — Telehealth: Payer: Self-pay | Admitting: Critical Care Medicine

## 2009-12-31 ENCOUNTER — Telehealth (INDEPENDENT_AMBULATORY_CARE_PROVIDER_SITE_OTHER): Payer: Self-pay | Admitting: *Deleted

## 2009-12-31 DIAGNOSIS — J156 Pneumonia due to other aerobic Gram-negative bacteria: Secondary | ICD-10-CM | POA: Insufficient documentation

## 2009-12-31 DIAGNOSIS — J1569 Pneumonia due to other gram-negative bacteria: Secondary | ICD-10-CM | POA: Insufficient documentation

## 2010-01-01 ENCOUNTER — Telehealth: Payer: Self-pay | Admitting: Critical Care Medicine

## 2010-01-01 ENCOUNTER — Emergency Department (HOSPITAL_COMMUNITY)
Admission: EM | Admit: 2010-01-01 | Discharge: 2010-01-01 | Payer: Self-pay | Source: Home / Self Care | Admitting: Emergency Medicine

## 2010-01-05 ENCOUNTER — Telehealth: Payer: Self-pay | Admitting: Critical Care Medicine

## 2010-01-05 ENCOUNTER — Encounter: Payer: Self-pay | Admitting: Critical Care Medicine

## 2010-01-06 ENCOUNTER — Telehealth: Payer: Self-pay | Admitting: Critical Care Medicine

## 2010-01-06 ENCOUNTER — Encounter (HOSPITAL_COMMUNITY)
Admission: RE | Admit: 2010-01-06 | Discharge: 2010-02-02 | Payer: Self-pay | Source: Home / Self Care | Attending: Oncology | Admitting: Oncology

## 2010-01-06 ENCOUNTER — Ambulatory Visit (HOSPITAL_COMMUNITY)
Admission: RE | Admit: 2010-01-06 | Discharge: 2010-02-02 | Payer: Self-pay | Source: Home / Self Care | Attending: Oncology | Admitting: Oncology

## 2010-01-06 LAB — COMPREHENSIVE METABOLIC PANEL
ALT: 16 U/L (ref 0–35)
AST: 23 U/L (ref 0–37)
Albumin: 3.1 g/dL — ABNORMAL LOW (ref 3.5–5.2)
Alkaline Phosphatase: 70 U/L (ref 39–117)
BUN: 7 mg/dL (ref 6–23)
CO2: 27 mEq/L (ref 19–32)
Calcium: 9.1 mg/dL (ref 8.4–10.5)
Chloride: 98 mEq/L (ref 96–112)
Creatinine, Ser: 0.88 mg/dL (ref 0.4–1.2)
GFR calc Af Amer: >60 mL/min (ref >60)
GFR calc non Af Amer: >60 mL/min (ref >60)
Glucose, Bld: 116 mg/dL — ABNORMAL HIGH (ref 70–99)
Potassium: 3.3 mEq/L — ABNORMAL LOW (ref 3.5–5.1)
Sodium: 138 mEq/L (ref 135–145)
Total Bilirubin: 0.4 mg/dL (ref 0.3–1.2)
Total Protein: 6.7 g/dL (ref 6.0–8.3)

## 2010-01-06 LAB — CBC
HCT: 29.3 % — ABNORMAL LOW (ref 36.0–46.0)
Hemoglobin: 10.2 g/dL — ABNORMAL LOW (ref 12.0–15.0)
MCH: 32.1 pg (ref 26.0–34.0)
MCHC: 34.8 g/dL (ref 30.0–36.0)
MCV: 92.1 fL (ref 78.0–100.0)
Platelets: 195 10*3/uL (ref 150–400)
RBC: 3.18 MIL/uL — ABNORMAL LOW (ref 3.87–5.11)
RDW: 15.8 % — ABNORMAL HIGH (ref 11.5–15.5)
WBC: 8.8 10*3/uL (ref 4.0–10.5)

## 2010-01-06 LAB — LACTATE DEHYDROGENASE: LDH: 131 U/L (ref 94–250)

## 2010-01-07 LAB — DIFFERENTIAL
Basophils Absolute: 0 10*3/uL (ref 0.0–0.1)
Basophils Relative: 0 % (ref 0–1)
Eosinophils Absolute: 0 10*3/uL (ref 0.0–0.7)
Eosinophils Relative: 1 % (ref 0–5)
Lymphocytes Relative: 8 % — ABNORMAL LOW (ref 12–46)
Lymphs Abs: 0.7 10*3/uL (ref 0.7–4.0)
Monocytes Absolute: 0.6 10*3/uL (ref 0.1–1.0)
Monocytes Relative: 6 % (ref 3–12)
Neutro Abs: 7.5 10*3/uL (ref 1.7–7.7)
Neutrophils Relative %: 85 % — ABNORMAL HIGH (ref 43–77)

## 2010-01-07 LAB — BETA 2 MICROGLOBULIN, SERUM: Beta-2 Microglobulin: 2.76 mg/L — ABNORMAL HIGH (ref 1.01–1.73)

## 2010-01-15 ENCOUNTER — Encounter
Admission: RE | Admit: 2010-01-15 | Discharge: 2010-02-02 | Payer: Self-pay | Source: Home / Self Care | Attending: Physical Medicine & Rehabilitation | Admitting: Physical Medicine & Rehabilitation

## 2010-01-18 ENCOUNTER — Ambulatory Visit: Admit: 2010-01-18 | Payer: Self-pay | Admitting: Physical Medicine & Rehabilitation

## 2010-01-19 ENCOUNTER — Telehealth (INDEPENDENT_AMBULATORY_CARE_PROVIDER_SITE_OTHER): Payer: Self-pay | Admitting: *Deleted

## 2010-01-19 ENCOUNTER — Ambulatory Visit: Payer: Self-pay | Admitting: Oncology

## 2010-01-20 ENCOUNTER — Encounter: Payer: Self-pay | Admitting: Critical Care Medicine

## 2010-01-23 ENCOUNTER — Encounter (HOSPITAL_COMMUNITY): Payer: Self-pay | Admitting: Oncology

## 2010-01-23 ENCOUNTER — Emergency Department (HOSPITAL_COMMUNITY)
Admission: EM | Admit: 2010-01-23 | Discharge: 2010-01-23 | Disposition: A | Discharge status: Other Acute Inpatient Hospital | Payer: Self-pay | Source: Home / Self Care | Admitting: Emergency Medicine

## 2010-01-23 ENCOUNTER — Encounter: Payer: Self-pay | Admitting: Physical Medicine & Rehabilitation

## 2010-01-23 ENCOUNTER — Encounter: Payer: Self-pay | Admitting: Obstetrics and Gynecology

## 2010-01-23 ENCOUNTER — Encounter: Payer: Self-pay | Admitting: Hematology and Oncology

## 2010-01-23 ENCOUNTER — Inpatient Hospital Stay (HOSPITAL_COMMUNITY)
Admission: EM | Admit: 2010-01-23 | Discharge: 2010-01-27 | Payer: Self-pay | Source: Home / Self Care | Attending: Pulmonary Disease | Admitting: Pulmonary Disease

## 2010-01-25 NOTE — H&P (Addendum)
[name], [name]                 ACCOUNT NO.:  [id number]  MEDICAL RECORD NO.:  [id number]          PATIENT TYPE:  EMS  LOCATION:  ED                            FACILITY:  [facility]  PHYSICIAN:  Wilson Singer, M.D.DATE OF BIRTH:  Dec 20, 1955  DATE OF ADMISSION:  01/23/2010 DATE OF DISCHARGE:  01/21/2012LH                             HISTORY & PHYSICAL   CHIEF COMPLAINT:  History of fevers and chills.  HISTORY OF PRESENTING ILLNESS:  This is a very pleasant 55 year old lady who is known to have non-Hodgkin lymphoma diagnosed in July 2011 and who was admitted in [facility], end of October beginning of November 2011.  She was hospitalized for approximately 40 days.  She had septic shock and sepsis requiring eventually intubation and mechanical ventilation.  She sustained a non-ST-elevation myocardial infarction and had reduced ejection fraction.  She required tracheostomy.  She had acute-on-chronic renal failure which apparently improved.  She was discharged to rehab on December 04, 2009 and then eventually discharged from rehab on December 10, 2009.  She seems to be doing well since that time and has not had any further chemotherapy.  The last bout of chemotherapy according to the patient was in October 2011.  She now comes in with a 12 to 24-hour history of fever and chills, and in the emergency room, it was found to be septic shock with a blood pressure 93 systolic, temperature measured 99.6, but at home was measured at 102, and the patient had apparently passed out in her primary care physician's office today.  PAST MEDICAL HISTORY:  Significant for diabetes, non-Hodgkin lymphoma as mentioned above, hypertension, not on any antihypertensives at the present time, history of left deep vein thrombosis diagnosed only 3 weeks ago and on Coumadin, gastroesophageal reflux disease and asthma.  MEDICATIONS: 1. Accolate 20 mg b.i.d. 2. Acyclovir 200 mg b.i.d. 3. Citalopram 20  mg daily. 4. Coumadin 6 mg daily. 5. Dulera twice a day. 6. Lasix 20 mg as needed. 7. Omeprazole 20 mg b.i.d. 8. Potassium chloride 40 mEq b.i.d. 9. Prednisone 10 mg daily. 10.Simvastatin 80 mg daily.  ALLERGIES:  DEMEROL and SINGULAIR.  SOCIAL HISTORY:  The patient is divorced and lives with her daughter and two grandchildren.  She does not smoke, does not drink alcohol.  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  PAST SURGICAL HISTORY:  Hysterectomy in the 1990s.  Neck surgery, right breast lumpectomy which was benign, and sinus surgery.  REVIEW OF SYSTEMS:  Apart from the symptoms as mentioned above, there is no new cough but she is dyspneic.  No other symptoms referable in all other systems.  PHYSICAL EXAMINATION:  VITAL SIGNS:  Blood pressure 93 systolic, pulse 120 and sinus rhythm. GENERAL:  She feels febrile to the touch.  She is alert and orientated, however. CARDIOVASCULAR:  Heart sounds are present and normal without murmurs. LUNGS:  Fields are appeared to be clear. ABDOMEN:  Soft and nontender with no hepatosplenomegaly. NEUROLOGIC:  She is alert and oriented without any focal neurologic signs at present time.  INVESTIGATIONS:  Hemoglobin 11.1, white blood cell count 15.2, and platelets 149.  Sodium 138, potassium 3.6, bicarbonate 26, BUN 7, creatinine 1.01, calcitonin 0.27.  Lactic acid 2.1, INR 1.5.  Chest x- ray shows persistent patchy infiltrates in the lung but not significantly changed from previous x-ray.  PROBLEM LIST: 1. Sepsis with possible shock. 2. Non-Hodgkin lymphoma diagnosed in July 2011. 3. Diabetes. 4. History of non-ST-elevation myocardial infarction with a reduced     ejection fraction. 5. Asthma. 6. Gastroesophageal reflux disease.  PLAN:  I have discussed this case with Dr. Micah Flesher, who is the intensivist at [facility] who knows her extremely well from previous admission in  November and December of last year.  He accepts her to be transferred from [facility] Emergency Room to the Intensive Care Unit at [facility] and in the meanwhile, the patient is receiving aggressive intravenous fluid resuscitation and is receiving intravenous vancomycin and intravenous Zosyn at appropriate doses.  Further recommendations obviously will depend under the care of Critical Care Team at [facility].     Wilson Singer, M.D.     NCG/MEDQ  D:  01/23/2010  T:  01/24/2010  Job:  [id number]  cc:   Ladona Horns. Mariel Sleet, MD Fax: [phone]  Charlcie Cradle. Delford Field, MD, FCCP [address] [place] [address]  Nelda Bucks, MD [address] [place] [address]  Electronically Signed by Lilly Cove M.D. on 01/25/2010 12:56:11 PM

## 2010-01-26 LAB — COMPREHENSIVE METABOLIC PANEL
ALT: 22 U/L (ref 0–35)
AST: 14 U/L (ref 0–37)
Albumin: 2.3 g/dL — ABNORMAL LOW (ref 3.5–5.2)
Albumin: 2.1 g/dL — ABNORMAL LOW (ref 3.5–5.2)
Alkaline Phosphatase: 53 U/L (ref 39–117)
BUN: 7 mg/dL (ref 6–23)
Chloride: 106 mEq/L (ref 96–112)
Chloride: 107 mEq/L (ref 96–112)
Creatinine, Ser: 0.87 mg/dL (ref 0.4–1.2)
GFR calc Af Amer: >60 mL/min (ref >60)
Potassium: 3.1 mEq/L — ABNORMAL LOW (ref 3.5–5.1)
Sodium: 136 mEq/L (ref 135–145)
Total Protein: 4.9 g/dL — ABNORMAL LOW (ref 6.0–8.3)
Total Protein: 5.2 g/dL — ABNORMAL LOW (ref 6.0–8.3)

## 2010-01-26 LAB — HEPATIC FUNCTION PANEL
ALT: 26 U/L (ref 0–35)
AST: 22 U/L (ref 0–37)
Albumin: 2.6 g/dL — ABNORMAL LOW (ref 3.5–5.2)
Total Bilirubin: 1 mg/dL (ref 0.3–1.2)

## 2010-01-26 LAB — CBC
HCT: 31.6 % — ABNORMAL LOW (ref 36.0–46.0)
HCT: 28.3 % — ABNORMAL LOW (ref 36.0–46.0)
Hemoglobin: 11.1 g/dL — ABNORMAL LOW (ref 12.0–15.0)
MCH: 33.2 pg (ref 26.0–34.0)
MCH: 31.8 pg (ref 26.0–34.0)
MCH: 32.8 pg (ref 26.0–34.0)
MCHC: 35.1 g/dL (ref 30.0–36.0)
MCHC: 32.5 g/dL (ref 30.0–36.0)
MCV: 94.6 fL (ref 78.0–100.0)
MCV: 97.9 fL (ref 78.0–100.0)
MCV: 98.8 fL (ref 78.0–100.0)
Platelets: 94 10*3/uL — ABNORMAL LOW (ref 150–400)
RBC: 2.5 MIL/uL — ABNORMAL LOW (ref 3.87–5.11)
RDW: 16.3 % — ABNORMAL HIGH (ref 11.5–15.5)
RDW: 16.8 % — ABNORMAL HIGH (ref 11.5–15.5)
RDW: 16.7 % — ABNORMAL HIGH (ref 11.5–15.5)
WBC: 7.6 10*3/uL (ref 4.0–10.5)

## 2010-01-26 LAB — DIFFERENTIAL
Eosinophils Relative: 0 % (ref 0–5)
Eosinophils Relative: 0 % (ref 0–5)
Lymphocytes Relative: 4 % — ABNORMAL LOW (ref 12–46)
Lymphocytes Relative: 4 % — ABNORMAL LOW (ref 12–46)
Lymphs Abs: 0.4 10*3/uL — ABNORMAL LOW (ref 0.7–4.0)
Monocytes Absolute: 0.7 10*3/uL (ref 0.1–1.0)
Monocytes Absolute: 0.6 10*3/uL (ref 0.1–1.0)
Monocytes Relative: 5 % (ref 3–12)
Monocytes Relative: 6 % (ref 3–12)
Neutro Abs: 13.9 10*3/uL — ABNORMAL HIGH (ref 1.7–7.7)

## 2010-01-26 LAB — BLOOD GAS, ARTERIAL
Bicarbonate: 23 mEq/L (ref 20.0–24.0)
O2 Saturation: 95.2 %
TCO2: 21.4 mmol/L (ref 0–100)
pO2, Arterial: 81.8 mmHg (ref 80.0–100.0)

## 2010-01-26 LAB — GLUCOSE, CAPILLARY
Glucose-Capillary: 95 mg/dL (ref 70–99)
Glucose-Capillary: 89 mg/dL (ref 70–99)
Glucose-Capillary: 132 mg/dL — ABNORMAL HIGH (ref 70–99)
Glucose-Capillary: 117 mg/dL — ABNORMAL HIGH (ref 70–99)
Glucose-Capillary: 94 mg/dL (ref 70–99)
Glucose-Capillary: 67 mg/dL — ABNORMAL LOW (ref 70–99)
Glucose-Capillary: 85 mg/dL (ref 70–99)
Glucose-Capillary: 99 mg/dL (ref 70–99)
Glucose-Capillary: 154 mg/dL — ABNORMAL HIGH (ref 70–99)

## 2010-01-26 LAB — URINALYSIS, ROUTINE W REFLEX MICROSCOPIC
Bilirubin Urine: NEGATIVE
Hgb urine dipstick: NEGATIVE
Protein, ur: NEGATIVE mg/dL
Specific Gravity, Urine: 1.018 (ref 1.005–1.030)
Urobilinogen, UA: 0.2 mg/dL (ref 0.0–1.0)

## 2010-01-26 LAB — EXPECTORATED SPUTUM ASSESSMENT W GRAM STAIN, RFLX TO RESP C

## 2010-01-26 LAB — LACTIC ACID, PLASMA: Lactic Acid, Venous: 2.1 mmol/L (ref 0.5–2.2)

## 2010-01-26 LAB — BASIC METABOLIC PANEL
BUN: 7 mg/dL (ref 6–23)
CO2: 26 mEq/L (ref 19–32)
Chloride: 102 mEq/L (ref 96–112)
Creatinine, Ser: 1.01 mg/dL (ref 0.4–1.2)
Glucose, Bld: 113 mg/dL — ABNORMAL HIGH (ref 70–99)
Potassium: 3.6 mEq/L (ref 3.5–5.1)

## 2010-01-26 LAB — PROCALCITONIN: Procalcitonin: 0.27 ng/mL

## 2010-01-27 LAB — BASIC METABOLIC PANEL
BUN: 9 mg/dL (ref 6–23)
CO2: 26 mEq/L (ref 19–32)
Calcium: 9.2 mg/dL (ref 8.4–10.5)
Calcium: 8.9 mg/dL (ref 8.4–10.5)
Chloride: 107 mEq/L (ref 96–112)
Creatinine, Ser: 0.91 mg/dL (ref 0.4–1.2)
Creatinine, Ser: 0.87 mg/dL (ref 0.4–1.2)
GFR calc Af Amer: >60 mL/min (ref >60)
Glucose, Bld: 108 mg/dL — ABNORMAL HIGH (ref 70–99)

## 2010-01-27 LAB — MAGNESIUM: Magnesium: 2 mg/dL (ref 1.5–2.5)

## 2010-01-27 LAB — CBC
HCT: 24.4 % — ABNORMAL LOW (ref 36.0–46.0)
Hemoglobin: 8.2 g/dL — ABNORMAL LOW (ref 12.0–15.0)
MCH: 32.5 pg (ref 26.0–34.0)
MCH: 32.7 pg (ref 26.0–34.0)
MCHC: 33.8 g/dL (ref 30.0–36.0)
MCHC: 33.6 g/dL (ref 30.0–36.0)
MCV: 96 fL (ref 78.0–100.0)
Platelets: 119 10*3/uL — ABNORMAL LOW (ref 150–400)
RBC: 2.77 MIL/uL — ABNORMAL LOW (ref 3.87–5.11)
RDW: 15.7 % — ABNORMAL HIGH (ref 11.5–15.5)
RDW: 15.9 % — ABNORMAL HIGH (ref 11.5–15.5)

## 2010-01-27 LAB — GLUCOSE, CAPILLARY: Glucose-Capillary: 134 mg/dL — ABNORMAL HIGH (ref 70–99)

## 2010-01-27 LAB — DIFFERENTIAL
Basophils Relative: 0 % (ref 0–1)
Eosinophils Absolute: 0 10*3/uL (ref 0.0–0.7)
Eosinophils Relative: 0 % (ref 0–5)
Lymphs Abs: 0.3 10*3/uL — ABNORMAL LOW (ref 0.7–4.0)
Monocytes Absolute: 0.1 10*3/uL (ref 0.1–1.0)
Monocytes Relative: 2 % — ABNORMAL LOW (ref 3–12)

## 2010-01-27 LAB — APTT: aPTT: 49 seconds — ABNORMAL HIGH (ref 24–37)

## 2010-01-27 LAB — BRAIN NATRIURETIC PEPTIDE: Pro B Natriuretic peptide (BNP): 149 pg/mL — ABNORMAL HIGH (ref 0.0–100.0)

## 2010-01-28 LAB — CULTURE, BLOOD (ROUTINE X 2)
Culture: NO GROWTH
Culture: NO GROWTH

## 2010-02-02 ENCOUNTER — Ambulatory Visit
Admission: RE | Admit: 2010-02-02 | Discharge: 2010-02-02 | Payer: Self-pay | Source: Home / Self Care | Attending: Critical Care Medicine | Admitting: Critical Care Medicine

## 2010-02-02 NOTE — Progress Notes (Signed)
Summary: OMEPRAZOLE REFILL  Phone Note Refill Request Message from:  Pharmacy on [facility] FAX [phone]  OMEPRAZOLE 20 MG  1 CAPSULE by mouth two times a day      New/Updated Medications: OMEPRAZOLE 20 MG CPDR (OMEPRAZOLE) 1 by mouth two times a day Prescriptions: OMEPRAZOLE 20 MG CPDR (OMEPRAZOLE) 1 by mouth two times a day  #60 x 0   Entered by:   Kandice Hams CMA   Authorized by:   Storm Frisk MD   Signed by:   Kandice Hams CMA on 06/25/2009   Method used:   Faxed to ...       [facility]* (retail)       [address]       [place], [place]  [address]       Ph: [phone]       Fax: [phone]   RxID:   [id number]

## 2010-02-02 NOTE — Assessment & Plan Note (Signed)
Summary: [name]  Nurse Visit   Allergies: 1)  ! Singulair (Montelukast Sodium) 2)  ! Demerol  Medication Administration  Injection # 1:    Medication: Xolair (omalizumab) 150mg     Diagnosis: EXTRINSIC ASTHMA, UNSPECIFIED (ICD-493.00)    Route: SQ    Site: L deltoid    Exp Date: 04/2012    Lot #: [id number]    Mfr: Mendel Ryder    Comments: Injection given by Dimas Millin in allergy lab. Xolair 300mg . 1.29ml x 1 in Left and Right Deltoid. Pt waited 30 minutes.     Patient tolerated injection without complications  Orders Added: 1)  Admin of patients own med IM/SQ [[id number]]

## 2010-02-02 NOTE — Medication Information (Signed)
Summary: Xolair / [facility]  Xolair / [facility]   Imported By: Lennie Odor 09/15/2009 16:25:27  _____________________________________________________________________  External Attachment:    Type:   Image     Comment:   External Document

## 2010-02-02 NOTE — Letter (Signed)
Summary: [facility]  [facility]   Imported By: Lester Oasis 09/14/2009 12:46:44  _____________________________________________________________________  External Attachment:    Type:   Image     Comment:   External Document

## 2010-02-02 NOTE — Assessment & Plan Note (Signed)
Summary: xolair/[facility]  Nurse Visit   Allergies: 1)  ! Singulair (Montelukast Sodium) 2)  ! Demerol  Medication Administration  Injection # 1:    Medication: Xolair (omalizumab) 150mg     Diagnosis: EXTRINSIC ASTHMA, UNSPECIFIED (ICD-493.00)    Route: SQ    Site: R deltoid    Exp Date: 05/03/2012    Lot #: [id number]    Mfr: GENENTECH    Comments: 1.2 ML IN RIGHT AND 1.2 ML IN LEFT ARM PT WAITED 20 MINS    Patient tolerated injection without complications    Given by: SUSANNE FORD IN ALLERGY LAB  Orders Added: 1)  Administration xolair injection [id number]   Medication Administration  Injection # 1:    Medication: Xolair (omalizumab) 150mg     Diagnosis: EXTRINSIC ASTHMA, UNSPECIFIED (ICD-493.00)    Route: SQ    Site: R deltoid    Exp Date: 05/03/2012    Lot #: [id number]    Mfr: GENENTECH    Comments: 1.2 ML IN RIGHT AND 1.2 ML IN LEFT ARM PT WAITED 20 MINS    Patient tolerated injection without complications    Given by: SUSANNE FORD IN ALLERGY LAB  Orders Added: 1)  Administration xolair injection [[id number]]

## 2010-02-02 NOTE — Miscellaneous (Signed)
Summary: Injection Record / [facility]    Injection Record / [facility]    Imported By: Lennie Odor 05/25/2009 14:29:36  _____________________________________________________________________  External Attachment:    Type:   Image     Comment:   External Document

## 2010-02-02 NOTE — Assessment & Plan Note (Signed)
Summary: Pulmonary OV   Copy to:  [facility] Primary Provider/Referring Provider:  Dr. Sherrin Daisy  CC:  1 mo follow up.  states breathing is doing better since starting dulera and doing pred taper.  No complaints. .  History of Present Illness: 55 yo with history of asthma, chronic bronchitis, rhinitis, and vocal cord dysfunction.  Follow up visit Sept. 17, 2010-- RAST and IgE from 09/01/08.  Dust mite titer elevated, and IgE 159.8. She has been getting allergy shots for about 2 yrs, and xolair shots for about 1 yr.  She was treated for chronic sinus infection with avelox for 3 weeks by Dr. Annalee Genta.   Over the last two weeks she has been feeling terrible.  She has a cough with yellow sputum.  She has frequent wheezing.  She has been using her rescue inhaler several times per day.  She denies hemoptysis.  Her sinuses have improved, but she is still having congestion.  She uses her netty pott two times a day.   She feels worse during the work week, and better on the weekend.  She thinks she is getting exposed to mold at work.   Spirometry today shows moderate to severe obstruction.  September 26, 2008--Returns for follow up of asthma flare. Last visit treated w/ steroid taper. Has been having recurrent asthma/sinus flare. Has been treated w/ multiple abx (avelox/augmentin). Symptoms some better but still has wheezing/coughw/ thick yellow mucus. Not ready to go back to work, still coughing all day/night, not able to sleep b/c of cough. 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 Denies chest pain,  orthopnea, hemoptysis, fever, n/v/d, edema, headache.  10/01/08- ALLERGY OV: Asthma, chronic bronchitis, rhinitis, vocal cord dysfunction.......................daughter here Levaquin and predninsone given last week by NP did help some. She is more upbeat than her daughter who indicates still wheezing. Cool wet weather bothers her some right now  but not as much as the hot weather did.We discussed her hx of nasal polyps, without hx of aspirin/ NSAID sensitivity. 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 She hasn't been "clear" in 4-5 years. She thinks she has been better. We diuscussed meaning of mold in environment-  October 24, 2008 11:32 AM Was out of work for two weeks. The pt is already on allergy immunotherapy and Xolair  needs to be retested.  The pt notes Mold on vents at work.  Just has problems at work. Works at [employer] and only ill  at work [employer] ws positive at work Now: still wheezing and has another sinus infection   saw Annalee Genta and rx avelox and then levaquin gets a bit better then worse again  CT scan is pending in one week  Is followed closely by ENT The pt just finsihed prednisone,  now pt is worse again with more cough and wheeze.  October 31, 2008 2:26 PM The pt is sl better, The pt  will cough with exertion Dr Annalee Genta did nasal cultures and is pending There is no sinus pressure,  the  mucous drains out as well There is some wheezing The pt had a sinus CT abn and full of infxn and fluid  THe pt has not returned to work  December 10, 2008 10:03 AM  Saw ENT 11/11 and had widely patent sinuses  c/s was neg.  to go back 12/8 for OV Sinuses are better.  Not as much drainage.  Still coughing for two weeks and coughed all night and is dry hacky. Blood vessel rupture.  Any activity will ppt cough and wheeze. There is no heartburn The pt ran out of tessalon . Still in the office environment with mold.  The pt is looking for another job.    January 29, 2009 4:21 PM Pt  having fewer sinus infxn The pt likes the dulera The pt had fever off and on and ? pna cxr abn,  rx with abx injection and cipro by mouth Pt denies any significant sore throat, nasal congestion or excess secretions, fever, chills, sweats, unintended weight loss, pleurtic or exertional chest  pain, orthopnea PND, or leg swelling Pt denies any increase in rescue therapy over baseline, denies waking up needing it or having any early am or nocturnal exacerbations of coughing/wheezing/or dyspnea.    Preventive Screening-Counseling & Management  Alcohol-Tobacco     Smoking Status: never     Passive Smoke Exposure: yes  Current Medications (verified): 1)  Dulera 200-5 Mcg/act Aero (Mometasone Furo-Formoterol Fum) .... 2 Puffs Twice Per Day 2)  Albuterol 90 Mcg/act  Aers (Albuterol) .... One To Two Puff Every 4 Hrs As Needed 3)  Nasonex 50 Mcg/act  Susp (Mometasone Furoate) .... Two Puff Ea Nostril Once Daily 4)  Xolair 150 Mg  Solr (Omalizumab) .... 300 Mg Sq Every 4 Weeks 5)  Allergy Vaccine 1:10, Dtr Gives (W-E) 6)  Accolate 20 Mg Tabs (Zafirlukast) .[name] Kitchen.. 1 Twice Daily Before Meals 7)  Astelin 137 Mcg/spray  Soln (Azelastine Hcl) .... Two Puff Ea Nostril Two Times A Day 8)  Epipen 0.3 Mg/0.87ml (1:1000)  Devi (Epinephrine Hcl (Anaphylaxis)) .... For Severe Allergic Reaction 9)  Diovan 160 Mg Tabs (Valsartan) .... Take One (1) Tablet By Mouth Daily 10)  Simvastatin 80 Mg Tabs (Simvastatin) .... 1/2 By Mouth Daily 11)  Omeprazole 20 Mg  Cpdr (Omeprazole) .... One By Mouth Twice Daily 12)  Reglan 10 Mg  Tabs (Metoclopramide Hcl) .... One  By Mouth Three Times A Day and  One At Bedtime 13)  Metformin Hcl 500 Mg Tabs (Metformin Hcl) .... 1/2 By Mouth Daily 14)  Tessalon 200 Mg Caps (Benzonatate) .[name] Kitchen.. 1 By Mouth Three Times A Day As Needed Cough,  Generic Ok 15)  Promethazine Vc/codeine 6.25-5-10 Mg/64ml Syrp (Phenyleph-Promethazine-Cod) .[name] Kitchen.. 1-2 Tsp Every 4-6 Hrs As Needed Cough , May Make You Sleepy 16)  Prednisone 10 Mg  Tabs (Prednisone) .... Take As Directed 4 Each Am X 4 Days, 3 X 4 Days, 2 X 4 Days, Then One Daily and Stay  Allergies (verified): 1)  ! Singulair (Montelukast Sodium) 2)  ! Demerol  Past History:  Past medical, surgical, family and social histories (including  risk factors) reviewed, and no changes noted (except as noted below).  Past Medical History: Reviewed history from 09/07/2007 and no changes required. ALLERGIC RHINITIS (ICD-477.9) OBSTRUCTIVE CHRONIC BRONCHITIS (ICD-491.20) VOCAL CORD DISORDER (ICD-478.5) SINUSITIS, CHRONIC NOS (ICD-473.9) GERD (ICD-530.81) ASTHMA (ICD-493.90)  Past Surgical History: Reviewed history from 04/21/2007 and no changes required. Hysterectomy Sinus Surgery Neck Surgery x 2 Breast surgery  Past Pulmonary History:  Pulmonary History: Pulmonary Function Test  Date: 09/19/2008 Height (in.): 62 Gender: Female  Pre-Spirometry  FVC     Value: 2.23 L/min   Pred: 3.01 L/min     % Pred: 74 % FEV1     Value: 1.23 L     Pred: 2.26 L     % Pred: 54 % FEV1/FVC   Value:  55 %     Pred: 75 %     % Pred: 73 % FEF 25-75   Value: 0.50 L/min   Pred: 2.68 L/min     % Pred: 18 %  Comments:  Moderate to severe obstruction.  Performed at time patient was having acute asthma symptoms.  Family History: Reviewed history from 09/26/2008 and no changes required. emphysema - mother allergies - father asthma - father in childhood heart disease -  brother and mother had MI cancer - MGM (leukemia) hypercholesterolemia - brother DM - brother stroke - mother had mini-strokes  Social History: Reviewed history from 09/26/2008 and no changes required. Patient never smoked.  no alcohol drinks 3 cups coffe daily divorced 3 children Financial planner for [id number] Smoke Exposure:  yes  Review of Systems       The patient complains of shortness of breath with activity and productive cough.  The patient denies shortness of breath at rest, non-productive cough, coughing up blood, chest pain, irregular heartbeats, acid heartburn, indigestion, loss of appetite, weight change, abdominal pain, difficulty swallowing, sore throat, tooth/dental problems, headaches, nasal congestion/difficulty breathing through nose, sneezing,  itching, ear ache, anxiety, depression, hand/feet swelling, joint stiffness or pain, rash, change in color of mucus, and fever.    Vital Signs:  Patient profile:   55 year old female Height:      62 inches Weight:      186.13 pounds BMI:     34.17 O2 Sat:      97 % on Room air Temp:     97.7 degrees F oral Pulse rate:   60 / minute BP sitting:   120 / 70  (right arm) Cuff size:   regular  Vitals Entered By: Gweneth Dimitri RN (January 29, 2009 4:11 PM)  O2 Flow:  Room air CC: 1 mo follow up.  states breathing is doing better since starting dulera and doing pred taper.  No complaints.  Comments Medications reviewed with patient Daytime contact number verified with patient. Gweneth Dimitri RN  January 29, 2009 4:12 PM    Physical Exam  Additional Exam:  Gen: Pleasant, well-nourished, in no distress , normal affect AOZ:HYQM erythema ,  no purulence Neck: No JVD, no TMG, no carotid bruits Lungs: No use of accessory muscles, no dullness to percussion,improved air flow Cardiovascular: RRR, heart sounds normal, no murmurs or gallops, no peripheral edema Abdomen: soft and non-tender, no HSM, BS normal Musculoskeletal: No deformities, no cyanosis or clubbing Neuro: alert, non-focal     Impression & Recommendations:  Problem # 1:  OTHER ACUTE SINUSITIS (ICD-461.8) Assessment Improved Resolution of acute sinusitis plan cont nasal hygiene Her updated medication list for this problem includes:    Nasonex 50 Mcg/act Susp (Mometasone furoate) .[name] Kitchen..[name] Kitchen Two puff ea nostril once daily    Astelin 137 Mcg/spray Soln (Azelastine hcl) .[name] Kitchen..[name] Kitchen Two puff ea nostril two times a day    Tessalon 200 Mg Caps (Benzonatate) .[name] Kitchen... 1 by mouth three times a day as needed cough,  generic ok    Promethazine Vc/codeine 6.25-5-10 Mg/70ml Syrp (Phenyleph-promethazine-cod) .[name] Kitchen... 1-2 tsp every 4-6 hrs as needed cough , may make you sleepy  Problem # 2:  EXTRINSIC ASTHMA, UNSPECIFIED (ICD-493.00) Assessment:  Improved Improved persistent asthma  plan No change in inhaled medications.   Maintain treatment program as currently prescribed.  Medications Added to Medication List This Visit: 1)  Prednisone 10 Mg Tabs (Prednisone) .... Take one daily  Complete Medication List: 1)  Dulera 200-5 Mcg/act  Aero (Mometasone furo-formoterol fum) .... 2 puffs twice per day 2)  Albuterol 90 Mcg/act Aers (Albuterol) .... One to two puff every 4 hrs as needed 3)  Nasonex 50 Mcg/act Susp (Mometasone furoate) .... Two puff ea nostril once daily 4)  Xolair 150 Mg Solr (Omalizumab) .... 300 mg sq every 4 weeks 5)  Allergy Vaccine 1:10, Dtr Gives (w-e)  6)  Accolate 20 Mg Tabs (Zafirlukast) .[name] Kitchen.. 1 twice daily before meals 7)  Astelin 137 Mcg/spray Soln (Azelastine hcl) .... Two puff ea nostril two times a day 8)  Epipen 0.3 Mg/0.30ml (1:1000) Devi (Epinephrine hcl (anaphylaxis)) .... For severe allergic reaction 9)  Diovan 160 Mg Tabs (Valsartan) .... Take one (1) tablet by mouth daily 10)  Simvastatin 80 Mg Tabs (Simvastatin) .... 1/2 by mouth daily 11)  Omeprazole 20 Mg Cpdr (Omeprazole) .... One by mouth twice daily 12)  Reglan 10 Mg Tabs (Metoclopramide hcl) .... One  by mouth three times a day and  one at bedtime 13)  Metformin Hcl 500 Mg Tabs (Metformin hcl) .... 1/2 by mouth daily 14)  Tessalon 200 Mg Caps (Benzonatate) .[name] Kitchen.. 1 by mouth three times a day as needed cough,  generic ok 15)  Promethazine Vc/codeine 6.25-5-10 Mg/20ml Syrp (Phenyleph-promethazine-cod) .[name] Kitchen.. 1-2 tsp every 4-6 hrs as needed cough , may make you sleepy 16)  Prednisone 10 Mg Tabs (Prednisone) .... Take one daily  Other Orders: Est. Patient Level IV (16109) Admin of patients own med IM/SQ [id number])  Patient Instructions: 1)  No change in medications 2)  Return two months   Immunization History:  Influenza Immunization History:    Influenza:  historical (11/03/2008)  Pneumovax Immunization History:    Pneumovax:  historical  (10/04/2006)    Medication Administration  Injection # 1:    Medication: Xolair (omalizumab) 150mg     Diagnosis: EXTRINSIC ASTHMA, UNSPECIFIED (ICD-493.00)    Route: SQ    Site: L deltoid    Exp Date: 03/2012    Lot #: [id number]    Mfr: Mendel Ryder    Comments: Xolair 300mg .   Given by Dimas Millin in Allergy Lab 1.2 mL x1 in Lefdt Deltoid 1.2 mL x2 in Right Deltoid Pt was seen by Dr after injection.    Patient tolerated injection without complications  Orders Added: 1)  Est. Patient Level IV [47829] 2)  Admin of patients own med IM/SQ [56213Y]

## 2010-02-02 NOTE — Discharge Summary (Addendum)
NAMEJASMIA, [name]                 ACCOUNT NO.:  [id number]  MEDICAL RECORD NO.:  [id number]          PATIENT TYPE:  INP  LOCATION:  5121                         FACILITY:  [facility]  PHYSICIAN:  Charlcie Cradle. Delford Field, MD, FCCPDATE OF BIRTH:  [birth year] 1955  DATE OF ADMISSION:  01/23/2010 DATE OF DISCHARGE:  01/27/2010                              DISCHARGE SUMMARY   DISCHARGE DIAGNOSES: 1. Cervical myelopathy. 2. Sinus tachycardia. 3. Asthma. 4. Sinusitis. 5. Status post syncopal episode. 6. Hypokalemia. 7. Probable aspiration pneumonia. 8. History of deep vein thrombosis on Coumadin therapy.  HISTORY OF PRESENT ILLNESS:  [name] is a 55 year old white female with a past medical history of large B-cell lymphoma stage IV, chronic sinusitis, respiratory failure with tracheostomy which she has since been decannulated, pseudomonas, DVT on Coumadin, chronic renal failure, who presented to Dr. Edison Simon office at [facility] with complaints of new fever that had developed the night prior to presentation of 102.8, productive cough and vomiting.  In the office, she was noted to have a syncopal episode with vomiting and possible aspiration event.  She was transported to the [facility] Emergency Room and admitted for the above complaint.  Initially, she was admitted to the intensive care unit.  She was pancultured and placed on vancomycin and Fortaz given that she is immunocompromised and has a history of pseudomonas.  Also noted in the office, [name] was noted to have oxygen saturations of 90% on room air.  She did complain of shoulder pain after falling in the doctor's office.  Hence, she was treated for a probable early pneumonia in the setting of immunocompromise.  On admit, she was noted to have an elevated WBC and absolute neutrophil count. Lactic acid was 2.1 and procalcitonin of 0.27 on admission.  She also was noted to be tachycardic likely in the setting of  dehydration and she was placed in a cervical collar and immobilized prior to transport. During hospitalization, she did undergo a CT of the head and C-spine which demonstrated no evidence of cervical spine fracture or subluxation.  CT of the head also was noted to be negative for intracranial abnormality; however, it did demonstrate chronic sinusitis. She also underwent CT angio of the chest to rule out pulmonary embolism in the setting of acute syncopal episode, which was also negative.  She was evaluated by Dr. Lovell Sheehan from [facility] during hospitalization.  Her cervical spine was cleared.  [name] does have a history of known DVT and initially anticoagulation was held out of concern for cervical injury.  However, she was transitioned back to her Coumadin dosing that she previously was on prior to admit.  She also underwent left lower extremity venous Dopplers which did not demonstrate any evidence of acute thrombosis.  Her calf vein thrombosis that was previously known was resolving.  She also underwent a 2-D echo which demonstrated normal left ventricle with normal systolic function and an estimated EF of 55% to 65%.  Mild mitral regurgitation was noted. Cultures during hospital admission remained negative.  She was treated with IV antibiotics for pneumonia.  She  has completed these at time of discharge.  LABORATORY DATA:  On January 27, 2010, BMP demonstrates sodium 139, potassium 3.8, chloride 104, CO2 26, glucose 108, BUN 16, creatinine 0.87, calcium 8.9.  On January 27, 2010, CBC demonstrates WBC 6.7, hemoglobin 8.2, hematocrit 24.4, platelet count 145,000.  On January 26, 2010, BNP is 149.  MICRO DATA: 1. MRSA, PCR, nasal screen is negative. 2. Blood cultures x2 on January 23, 2010, demonstrate no growth on     preliminary results, final results pending.  RADIOLOGIC DATA:  On January 25, 2010, MRI of the C-spine demonstrates acute or subacute superior end  plate T3 and T4 compression fractures. T3 shows about 25% of a loss of vertebral body height.  T4 shows less than 25% of loss of vertebral body height.  Complete fusion from C5 to C7.  Mild adjacent segment C4-C5 degenerative disk disease.  Left foraminal encroachment at C3-C4 and C4-C5 associated with degenerative uncovertebral spurring and facet arthrosis.  On January 23, 2010, CT of the head and C-spine, both were negative, please see HPI for results. On January 27, 2010, CT angio of the chest is negative for pulmonary embolism, noted bilateral airspace disease predominately involving both lower lobes, suspicious for pneumonia.  HOSPITAL COURSE BY DISCHARGE DIAGNOSES: 1. Cervical myelopathy with unclear etiology.  [name] was noted to     have a syncopal event prior to hospitalization.  She did fall and     complained of bilateral shoulder pain post fall.  She initially was     maintained with cervical spine stabilization and C-collar.  She     underwent CT of the head and C-spine and was cleared.  Please see     MR of the spine for results. 2. Sinus tachycardia.  [name] was initially tachycardic on admit,     was thought secondary to dehydration.  She was gently rehydrated     and the sinus tachycardia resolved. 3. History of asthma.  [name] was maintained during hospitalization     on previous home asthma regimen and will be discharged on the same     regimen. 4. Chronic sinusitis.  During evaluation to rule out C-spine injury,     [name] was noted to have chronic sinusitis which seemed to be     improved from previous imaging. 5. Status post syncopal episode of unclear etiology.  CT of the chest     was noted to be negative for pulmonary embolism.  She was evaluated     for lower extremity swelling which she did have a known DVT.  There     was no acute DVT noted; however, it was noted that the old DVT was     resolving. 6. Hypokalemia.  [name] was noted to have  low potassium during     hospitalization, this was repleted with daily monitoring as needed. 7. Probable aspiration event.  [name] did have vomiting prior to     syncopal episode in her primary care office.  There was concern for     aspiration.  It was noted on CT angio of the chest that she did     have bilateral lower lobe airspace disease which was suspicious for     pneumonia.  She was covered for pneumonia, and at time of discharge     has completed her IV antibiotics to include vancomycin and Fortaz     as well as her completion of ceftriaxone. 8. History  of DVT.  As noted above, she was examined for new DVT which     was negative and the old DVT was noted to be resolving.  She will     continue postdischarge on her previous Coumadin.  DISCHARGE INSTRUCTIONS: 1. Activity:  As tolerated. 2. Diet:  As tolerated. 3. Followup:  [name] has previously scheduled followup appointment     with Dr. Delford Field next Tuesday, and she has been instructed to follow     up as previously scheduled.  DISCHARGE MEDICATIONS: 1. Flexeril 5 mg by mouth daily at bedtime as needed, total dispensed     7 tablets. 2. Ferrous sulfate 325 mg by mouth x3 daily. 3. Accolate 20 mg 1 tablet by mouth twice daily. 4. Acyclovir 200 mg 1 capsule by mouth twice daily. 5. Albuterol 90 mcg inhaler 2 puffs inhaled every 4 hours as needed. 6. Ativan 1 mg by mouth twice daily as needed when taking chemo. 7. Citalopram 20 mg by mouth daily. 8. Coumadin 6 mg 1 tablet by mouth daily. 9. Dulera 200 mcg 2 puffs inhaled twice daily. 10.Furosemide 20 mg 1 tablet by mouth daily as needed for swelling. 11.Vicodin 5/325 one to two tablets by mouth every 4 hours as needed     for pain. 12.Omeprazole 20 mg 1 tablet by mouth twice daily. 13.Potassium 20 mEq per 15 mL, the dose of 30 mL by mouth twice daily. 14.Prednisone 10 mg 1 tablet by mouth daily. 15.Simvastatin 80 mg tablet, one-half tablet by mouth daily. 16.Tessalon  Perles 200 mg 1 capsule by mouth 3 times daily as needed     for cough.  DISPOSITION AT TIME OF DISCHARGE:  [name] has met maximum benefit of inpatient therapy and is currently medically stable and cleared for discharge pending follow up with Dr. Delford Field as previously scheduled. Time spent on disposition greater than 35 minutes.     Canary Brim, NP   ______________________________ Charlcie Cradle Delford Field, MD, FCCP    BO/MEDQ  D:  01/27/2010  T:  01/28/2010  Job:  [id number]  cc:   Kirk Ruths, M.D.  Electronically Signed by Shan Levans MD FCCP on 02/02/2010 11:17:09 AM Electronically Signed by Canary Brim  on 02/03/2010 01:53:37 PM

## 2010-02-02 NOTE — Progress Notes (Signed)
Summary: OV needed   Phone Note Outgoing Call   Reason for Call: Confirm/change Appt Summary of Call: this pt needs an ov  was just in hospital Initial call taken by: Storm Frisk MD,  September 20, 2009 10:24 AM  Follow-up for Phone Call        [facility]  Gweneth Dimitri RN  September 21, 2009 1:26 PM   [facility]  Gweneth Dimitri RN  September 23, 2009 9:45 AM  Scheduled hfu on 10/4.Darletta Moll  September 25, 2009 10:32 AM         [facility]  Gweneth Dimitri RN  September 24, 2009 3:23 PM   Additional Follow-up for Phone Call Additional follow up Details #1::        ok Additional Follow-up by: Storm Frisk MD,  September 25, 2009 1:36 PM

## 2010-02-02 NOTE — Assessment & Plan Note (Signed)
Summary: Pulmonary OV   Copy to:  [name] Primary Provider/Referring Provider:  Dr. Sherrin [name]  CC:  2 month follow up.  states breathing is doing well overall.  states she will occ get a "rattling cough" - prod with yellow mucus.  denies wheezing and chest tightness. [name] Kitchen  History of Present Illness: 55 yo with history of asthma, chronic bronchitis, rhinitis, and vocal cord dysfunction.  Follow up visit Sept. 17, 2010-- RAST and IgE from 09/01/08.  Dust mite titer elevated, and IgE 159.8. She has been getting allergy shots for about 2 yrs, and xolair shots for about 1 yr.  She was treated for chronic sinus infection with avelox for 3 weeks by [name].   Over the last two weeks she has been feeling terrible.  She has a cough with yellow sputum.  She has frequent wheezing.  She has been using her rescue inhaler several times per day.  She denies hemoptysis.  Her sinuses have improved, but she is still having congestion.  She uses her netty pott two times a day.   She feels worse during the work week, and better on the weekend.  She thinks she is getting exposed to mold at work.   Spirometry today shows moderate to severe obstruction.  September 26, 2008--Returns for follow up of asthma flare. Last visit treated w/ steroid taper. Has been having recurrent asthma/sinus flare. Has been treated w/ multiple abx (avelox/augmentin). Symptoms some better but still has wheezing/coughw/ thick yellow mucus. Not ready to go back to work, still coughing all day/night, not able to sleep b/c of cough. 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 Denies chest pain,  orthopnea, hemoptysis, fever, n/v/d, edema, headache.  10/01/08- ALLERGY OV: Asthma, chronic bronchitis, rhinitis, vocal cord dysfunction.......................daughter here Levaquin and predninsone given last week by NP did help some. She is more upbeat than her daughter who indicates  still wheezing. Cool wet weather bothers her some right now but not as much as the hot weather did.We discussed her hx of nasal polyps, without hx of aspirin/ NSAID sensitivity. 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 She hasn't been "clear" in 4-5 years. She thinks she has been better. We diuscussed meaning of mold in environment-  October 24, 2008 11:32 AM Was out of work for two weeks. The pt is already on allergy immunotherapy and Xolair  needs to be retested.  The pt notes Mold on vents at work.  Just has problems at work. Works at [employer] and only ill  at work [employer] ws positive at work Now: still wheezing and has another sinus infection   saw Annalee [name] and rx avelox and then levaquin gets a bit better then worse again  CT scan is pending in one week  Is followed closely by ENT The pt just finsihed prednisone,  now pt is worse again with more cough and wheeze.  October 31, 2008 2:26 PM The pt is sl better, The pt  will cough with exertion Dr Annalee [name] did nasal cultures and is pending There is no sinus pressure,  the  mucous drains out as well There is some wheezing The pt had a sinus CT abn and full of infxn and fluid  THe pt has not returned to work  December 10, 2008 10:03 AM  Saw ENT 11/11 and had widely patent sinuses  c/s was neg.  to go back  12/8 for OV Sinuses are better.  Not as much drainage.  Still coughing for two weeks and coughed all night and is dry hacky. Blood vessel rupture.  Any activity will ppt cough and wheeze. There is no heartburn The pt ran out of tessalon . Still in the office environment with mold.  The pt is looking for another job.    January 29, 2009 4:21 PM Pt  having fewer sinus infxn The pt likes the dulera The pt had fever off and on and ? pna cxr abn,  rx with abx injection and cipro by mouth Pt denies any significant sore throat, nasal congestion or excess secretions, fever, chills,  sweats, unintended weight loss, pleurtic or exertional chest pain, orthopnea PND, or leg swelling Pt denies any increase in rescue therapy over baseline, denies waking up needing it or having any early am or nocturnal exacerbations of coughing/wheezing/or dyspnea.  March 30, 2009 4:07 PM Doing well since 1/11.  Only used SABA twice since last ov in 1/11. Has a dry hacky cough.  No real wheeze.  No pndrip or pressure.  No fever.   On pred 10mg /day.  Asthma History    Asthma Control Assessment:    Age range: 12+ years    Symptoms: 0-2 days/week    Nighttime Awakenings: 0-2/month    Interferes w/ normal activity: some limitations    SABA use (not for EIB): 0-2 days/week    ATAQ questionnaire: 1-2    FEV1: 1.23 liters (today)    FEV1 Pred: 2.26 liters (today)    Exacerbations requiring oral systemic steroids: 2 or more/year    Asthma Control Assessment: Very Poorly Controlled   Preventive Screening-Counseling & Management  Alcohol-Tobacco     Smoking Status: never  Current Medications (verified): 1)  Dulera 200-5 Mcg/act Aero (Mometasone Furo-Formoterol Fum) .... 2 Puffs Twice Per Day 2)  Albuterol 90 Mcg/act  Aers (Albuterol) .... One To Two Puff Every 4 Hrs As Needed 3)  Nasonex 50 Mcg/act  Susp (Mometasone Furoate) .... Two Puff Ea Nostril Once Daily 4)  Xolair 150 Mg  Solr (Omalizumab) .... 300 Mg Sq Every 4 Weeks 5)  Allergy Vaccine 1:10, Dtr Gives (W-E) 6)  Accolate 20 Mg Tabs (Zafirlukast) .[name] Kitchen.. 1 Twice Daily Before Meals 7)  Astelin 137 Mcg/spray  Soln (Azelastine Hcl) .... Two Puff Ea Nostril Two Times A Day 8)  Epipen 0.3 Mg/0.61ml (1:1000)  Devi (Epinephrine Hcl (Anaphylaxis)) .... For Severe Allergic Reaction 9)  Diovan 160 Mg Tabs (Valsartan) .... Take One (1) Tablet By Mouth Daily 10)  Simvastatin 80 Mg Tabs (Simvastatin) .... 1/2 By Mouth Daily 11)  Omeprazole 20 Mg  Cpdr (Omeprazole) .... One By Mouth Twice Daily 12)  Reglan 10 Mg  Tabs (Metoclopramide Hcl) .... One   By Mouth Three Times A Day and  One At Bedtime 13)  Metformin Hcl 500 Mg Tabs (Metformin Hcl) .... 1/2 By Mouth Daily 14)  Tessalon 200 Mg Caps (Benzonatate) .[name] Kitchen.. 1 By Mouth Three Times A Day As Needed Cough,  Generic Ok 15)  Promethazine Vc/codeine 6.25-5-10 Mg/18ml Syrp (Phenyleph-Promethazine-Cod) .[name] Kitchen.. 1-2 Tsp Every 4-6 Hrs As Needed Cough , May Make You Sleepy 16)  Prednisone 10 Mg  Tabs (Prednisone) .... Take One Daily  Allergies (verified): 1)  ! Singulair (Montelukast Sodium) 2)  ! Demerol  Past History:  Past medical, surgical, family and social histories (including risk factors) reviewed for relevance to current acute and chronic problems.  Past Medical History: Reviewed history from  09/07/2007 and no changes required. ALLERGIC RHINITIS (ICD-477.9) OBSTRUCTIVE CHRONIC BRONCHITIS (ICD-491.20) VOCAL CORD DISORDER (ICD-478.5) SINUSITIS, CHRONIC NOS (ICD-473.9) GERD (ICD-530.81) ASTHMA (ICD-493.90)  Past Surgical History: Reviewed history from 04/21/2007 and no changes required. Hysterectomy Sinus Surgery Neck Surgery x 2 Breast surgery  Past Pulmonary History:  Pulmonary History: Pulmonary Function Test  Date: 09/19/2008 Height (in.): 62 Gender: Female  Pre-Spirometry  FVC     Value: 2.23 L/min   Pred: 3.01 L/min     % Pred: 74 % FEV1     Value: 1.23 L     Pred: 2.26 L     % Pred: 54 % FEV1/FVC   Value: 55 %     Pred: 75 %     % Pred: 73 % FEF 25-75   Value: 0.50 L/min   Pred: 2.68 L/min     % Pred: 18 %  Comments:  Moderate to severe obstruction.  Performed at time patient was having acute asthma symptoms.  Family History: Reviewed history from 09/26/2008 and no changes required. emphysema - mother allergies - father asthma - father in childhood heart disease -  brother and mother had MI cancer - MGM (leukemia) hypercholesterolemia - brother DM - brother stroke - mother had mini-strokes  Social History: Reviewed history from 09/26/2008 and no changes  required. Patient never smoked.  no alcohol drinks 3 cups coffe daily divorced 3 children Financial planner for [employer]  Review of Systems  The patient denies shortness of breath with activity, shortness of breath at rest, productive cough, non-productive cough, coughing up blood, chest pain, irregular heartbeats, acid heartburn, indigestion, loss of appetite, weight change, abdominal pain, difficulty swallowing, sore throat, tooth/dental problems, headaches, nasal congestion/difficulty breathing through nose, sneezing, itching, ear ache, anxiety, depression, hand/feet swelling, joint stiffness or pain, rash, change in color of mucus, and fever.    Vital Signs:  Patient profile:   55 year old female Height:      62 inches Weight:      183.13 pounds BMI:     33.62 O2 Sat:      96 % on Room air Temp:     98.2 degrees F oral Pulse rate:   91 / minute BP sitting:   112 / 72  (left arm) Cuff size:   regular  Vitals Entered By: Gweneth Dimitri RN (March 30, 2009 3:57 PM)  O2 Flow:  Room air CC: 2 month follow up.  states breathing is doing well overall.  states she will occ get a "rattling cough" - prod with yellow mucus.  denies wheezing and chest tightness.  Comments Medications reviewed with patient Daytime contact number verified with patient. Gweneth Dimitri RN  March 30, 2009 3:57 PM    Physical Exam  Additional Exam:  Gen: Pleasant, well-nourished, in no distress , normal affect [id number]:[id number] erythema ,  no purulence Neck: No JVD, no TMG, no carotid bruits Lungs: No use of accessory muscles, no dullness to percussion,improved air flow Cardiovascular: RRR, heart sounds normal, no murmurs or gallops, no peripheral edema Abdomen: soft and non-tender, no HSM, BS normal Musculoskeletal: No deformities, no cyanosis or clubbing Neuro: alert, non-focal     Pre-Spirometry FEV1    Value: 1.23 L     Pred: 2.26 L     Impression & Recommendations:  Problem # 1:  EXTRINSIC ASTHMA,  UNSPECIFIED (ICD-493.00) Assessment Improved  Improved persistent asthma  plan No change in inhaled medications.   Slow prednisone taper    Medications Added to Medication  List This Visit: 1)  Prednisone 1 Mg Tabs (Prednisone) .... Take as directed for prednisone taper  Complete Medication List: 1)  Dulera 200-5 Mcg/act Aero (Mometasone furo-formoterol fum) .... 2 puffs twice per day 2)  Albuterol 90 Mcg/act Aers (Albuterol) .... One to two puff every 4 hrs as needed 3)  Nasonex 50 Mcg/act Susp (Mometasone furoate) .... Two puff ea nostril once daily 4)  Xolair 150 Mg Solr (Omalizumab) .... 300 mg sq every 4 weeks 5)  Allergy Vaccine 1:10, Dtr Gives (w-e)  6)  Accolate 20 Mg Tabs (Zafirlukast) .[name] Kitchen.. 1 twice daily before meals 7)  Astelin 137 Mcg/spray Soln (Azelastine hcl) .... Two puff ea nostril two times a day 8)  Epipen 0.3 Mg/0.53ml (1:1000) Devi (Epinephrine hcl (anaphylaxis)) .... For severe allergic reaction 9)  Diovan 160 Mg Tabs (Valsartan) .... Take one (1) tablet by mouth daily 10)  Simvastatin 80 Mg Tabs (Simvastatin) .... 1/2 by mouth daily 11)  Omeprazole 20 Mg Cpdr (Omeprazole) .... One by mouth twice daily 12)  Reglan 10 Mg Tabs (Metoclopramide hcl) .... One  by mouth three times a day and  one at bedtime 13)  Metformin Hcl 500 Mg Tabs (Metformin hcl) .... 1/2 by mouth daily 14)  Tessalon 200 Mg Caps (Benzonatate) .[name] Kitchen.. 1 by mouth three times a day as needed cough,  generic ok 15)  Promethazine Vc/codeine 6.25-5-10 Mg/80ml Syrp (Phenyleph-promethazine-cod) .[name] Kitchen.. 1-2 tsp every 4-6 hrs as needed cough , may make you sleepy 16)  Prednisone 10 Mg Tabs (Prednisone) .... Take one daily 17)  Prednisone 1 Mg Tabs (Prednisone) .... Take as directed for prednisone taper  Other Orders: Est. Patient Level IV (81191) Admin of patients own med IM/SQ [phone])  Patient Instructions: 1)  Take 1mg  prednisone in combination with 1/2 10mg  prednisone to equal 9mg  prednisone (4 - 1mg  plus  1/2 10mg  = 9mg ) daily for 7days then reduce by 1mg  every 7days until at 1/2 10mg  (5mg ) prednisone daily and stay 2)  All other medications the same 3)  Continue to rinse sinuses daily with nedipot 4)  Return two months  Prescriptions: PREDNISONE 1 MG TABS (PREDNISONE) Take as directed for prednisone taper  #100 x 6   Entered and Authorized by:   Storm Frisk MD   Signed by:   Storm Frisk MD on 03/30/2009   Method used:   Electronically to        [facility]* (retail)       [address] [place], [place]  [address]       Ph: [phone]       Fax: [phone]   RxID:   [id number]    Medication Administration  Injection # 1:    Medication: Xolair (omalizumab) 150mg     Diagnosis: EXTRINSIC ASTHMA, UNSPECIFIED (ICD-493.00)    Route: SQ    Site: R deltoid    Exp Date: 05/2012    Lot #: [id number]    Mfr: Mendel Ryder    Comments: Injections given by Dimas Millin in Allergy Lab. Pt was seen by MD after injections. Xolair 300mg  1.2 mL x1 in Right Deltoid 1.2 mL x1 in Left Deltoid    Patient tolerated injection without complications  Orders Added: 1)  Est. Patient Level IV [38756] 2)  Admin of patients own med IM/SQ [[id number]]   Appended Document: Pulmonary OV fax Dr Regino Schultze

## 2010-02-02 NOTE — Medication Information (Signed)
Summary: [facility]  [facility]   Imported By: L[place] 07/14/2009 09:10:54  _____________________________________________________________________  External Attachment:    Type:   Image     Comment:   External Document

## 2010-02-02 NOTE — Letter (Signed)
Summary: [facility]  [facility]   Imported By: Sherian Rein 09/29/2009 12:11:12  _____________________________________________________________________  External Attachment:    Type:   Image     Comment:   External Document

## 2010-02-02 NOTE — Assessment & Plan Note (Signed)
Summary: xolair/klw  Nurse Visit   Allergies: 1)  ! Singulair (Montelukast Sodium) 2)  ! Demerol  Medication Administration  Injection # 1:    Medication: Xolair (omalizumab) 150mg     Diagnosis: EXTRINSIC ASTHMA, UNSPECIFIED (ICD-493.00)    Route: SQ    Site: R deltoid    Exp Date: 03/05/2012    Lot #: [id number]    Mfr: GENENTECH    Comments: 1.2 ML IN RIGHT AND 1.2ML IN LEFT ARM PT WAITED 20 MINS    Patient tolerated injection without complications    Given by: SUSANNE FORD IN ALLERGY LAB  Orders Added: 1)  Administration xolair injection [id number]   Medication Administration  Injection # 1:    Medication: Xolair (omalizumab) 150mg     Diagnosis: EXTRINSIC ASTHMA, UNSPECIFIED (ICD-493.00)    Route: SQ    Site: R deltoid    Exp Date: 03/05/2012    Lot #: [id number]    Mfr: GENENTECH    Comments: 1.2 ML IN RIGHT AND 1.2ML IN LEFT ARM PT WAITED 20 MINS    Patient tolerated injection without complications    Given by: SUSANNE FORD IN ALLERGY LAB  Orders Added: 1)  Administration xolair injection [phone]

## 2010-02-02 NOTE — Assessment & Plan Note (Signed)
Summary: Pulmonary OV   Copy to:  [facility] V[facility]/Referring Jaykub Mackins:  Dr. Sherrin Daisy  CC:  Follow up.  Pt states breathing is doing well overall.  Coughing a little with a small amount of yellow mucus.  Denies SOB, wheezing, and chest tightness.  Recently found out she has lymphoma..  History of Present Illness: 55 yo with history of asthma, chronic bronchitis, rhinitis, and vocal cord dysfunction.  September 09, 2009 3:26 PM The pt since last ov 4/11 has been dx with Non hodgkins lymphoma.  Pt now on chemorx per Dr Mariel Sleet. Pt has had pseudomonas sinusitis with R maxillary sinus involvement.  Pt on chronic cipro abx.  Pt is still on xolair.  No new complaints.  No wheeze or cough.  No excess mucus production. Pt denies any significant sore throat, nasal congestion or excess secretions, fever, chills, sweats, unintended weight loss, pleurtic or exertional chest pain, orthopnea PND, or leg swelling Pt denies any increase in rescue therapy over baseline, denies waking up needing it or having any early am or nocturnal exacerbations of coughing/wheezing/or dyspnea. Pt also denies any obvious fluctuation in symptoms wiht weather or environmental change or other alleviating or aggravating factors   September 09, 2009 3:26 PM fever for two months, pain right side and abn CT scan.  rough two months, in new job out of mold ,new building, and has seen a difference already,  green mucus in nose   pseudomonas in sinuses went to [facility] for second opinion.  NHL  positive   Asthma History    Asthma Control Assessment:    Age range: 12+ years    Symptoms: 0-2 days/week    Nighttime Awakenings: 0-2/month    Interferes w/ normal activity: no limitations    SABA use (not for EIB): 0-2 days/week    ATAQ questionnaire: 0    FEV1: 1.23 liters (today)    FEV1 Pred: 2.26 liters (today)    Exacerbations requiring oral systemic steroids: 0-1/year    Asthma Control Assessment:  Very Poorly Controlled   Preventive Screening-Counseling & Management  Alcohol-Tobacco     Smoking Status: never     Passive Smoke Exposure: yes  Current Medications (verified): 1)  Dulera 200-5 Mcg/act Aero (Mometasone Furo-Formoterol Fum) .... 2 Puffs Twice Per Day 2)  Albuterol 90 Mcg/act  Aers (Albuterol) .... One To Two Puff Every 4 Hrs As Needed 3)  Nasonex 50 Mcg/act  Susp (Mometasone Furoate) .... Two Puff Ea Nostril Once Daily 4)  Xolair 150 Mg  Solr (Omalizumab) .... 300 Mg Sq Every 4 Weeks 5)  Allergy Vaccine 1:10, Dtr Gives (W-E) 6)  Accolate 20 Mg Tabs (Zafirlukast) .[name] Kitchen.. 1 Twice Daily Before Meals 7)  Astelin 137 Mcg/spray  Soln (Azelastine Hcl) .... Two Puff Ea Nostril Two Times A Day 8)  Epipen 0.3 Mg/0.54ml (1:1000)  Devi (Epinephrine Hcl (Anaphylaxis)) .... For Severe Allergic Reaction 9)  Diovan 160 Mg Tabs (Valsartan) .... Take One (1) Tablet By Mouth Daily 10)  Simvastatin 80 Mg Tabs (Simvastatin) .... 1/2 By Mouth Daily 11)  Reglan 10 Mg  Tabs (Metoclopramide Hcl) .... One  By Mouth Three Times A Day and  One At Bedtime 12)  Metformin Hcl 500 Mg Tabs (Metformin Hcl) .... 1/2 By Mouth Daily 13)  Tessalon 200 Mg Caps (Benzonatate) .[name] Kitchen.. 1 By Mouth Three Times A Day As Needed Cough,  Generic Ok 14)  Promethazine Vc/codeine 6.25-5-10 Mg/92ml Syrp (Phenyleph-Promethazine-Cod) .[name] Kitchen.. 1-2 Tsp Every 4-6 Hrs As Needed  Cough , May Make You Sleepy 15)  Prednisone 10 Mg  Tabs (Prednisone) .... 1/2 Tablet Daily 16)  Omeprazole 20 Mg Cpdr (Omeprazole) .[name] Kitchen.. 1 By Mouth Two Times A Day 17)  Septra Ds 800-160 Mg Tabs (Sulfamethoxazole-Trimethoprim) .... Take On Saturday and Sunday Follow Chemo Treatments 18)  Cipro 500 Mg Tabs (Ciprofloxacin Hcl) .... Take Two Times A Day Starting Tueday After Chemo Treatment For 7 Days 19)  Acyclovir 200 Mg Caps (Acyclovir) .... 2 in Am and 2 in Pm 20)  Allipurinol ?mg .... Take 1 Tablet By Mouth Once A Day 21)  Newlesta Injection .... On Sundays  Following Chemo  Allergies (verified): 1)  ! Singulair (Montelukast Sodium) 2)  ! Demerol  Past History:  Past Medical History: ALLERGIC RHINITIS (ICD-477.9) OBSTRUCTIVE CHRONIC BRONCHITIS (ICD-491.20) VOCAL CORD DISORDER (ICD-478.5) SINUSITIS, CHRONIC NOS (ICD-473.9) GERD (ICD-530.81) ASTHMA (ICD-493.90) Non Hodgkins Lymphoma      6/11  Ne[facility]  Past Surgical History: Hysterectomy Sinus Surgery Neck Surgery x 2 Breast surgery R [place] portacath  7/11  Past Pulmonary History:  Pulmonary History: Pulmonary Function Test  Date: 09/19/2008 Height (in.): 62 Gender: Female  Pre-Spirometry  FVC     Value: 2.23 L/min   Pred: 3.01 L/min     % Pred: 74 % FEV1     Value: 1.23 L     Pred: 2.26 L     % Pred: 54 % FEV1/FVC   Value: 55 %     Pred: 75 %     % Pred: 73 % FEF 25-75   Value: 0.50 L/min   Pred: 2.68 L/min     % Pred: 18 %  Comments:  Moderate to severe obstruction.  Performed at time patient was having acute asthma symptoms.  Review of Systems       The patient complains of shortness of breath with activity.  The patient denies shortness of breath at rest, productive cough, non-productive cough, coughing up blood, chest pain, irregular heartbeats, acid heartburn, indigestion, loss of appetite, weight change, abdominal pain, difficulty swallowing, sore throat, tooth/dental problems, headaches, nasal congestion/difficulty breathing through nose, sneezing, itching, ear ache, anxiety, depression, hand/feet swelling, joint stiffness or pain, rash, change in color of mucus, and fever.    Vital Signs:  Patient profile:   54 year old female Height:      62 inches Weight:      167.50 pounds BMI:     30.75 O2 Sat:      98 % on Room air Temp:     98 .2 degrees F oral Pulse rate:   77 / minute BP sitting:   116 / 88  (left arm) Cuff size:   regular  Vitals Entered By: Gweneth Dimitri RN (September 09, 2009 3:09 PM)  O2 Flow:  Room air CC: Follow up.  Pt states breathing is  doing well overall.  Coughing a little with a small amount of yellow mucus.  Denies SOB, wheezing, chest tightness.  Recently found out she has lymphoma. Comments Medications reviewed with patient Daytime contact number verified with patient. Gweneth Dimitri RN  September 09, 2009 3:11 PM    Physical Exam  Additional Exam:  Gen: Pleasant, well-nourished, in no distress , normal affect YQM:VHQI erythema ,  no purulence Neck: No JVD, no TMG, no carotid bruits Lungs: No use of accessory muscles, no dullness to percussion,improved air flow Cardiovascular: RRR, heart sounds normal, no murmurs or gallops, no peripheral edema Abdomen: soft and non-tender, no HSM, BS  normal Musculoskeletal: No deformities, no cyanosis or clubbing Neuro: alert, non-focal     Pre-Spirometry FEV1    Value: 1.23 L     Pred: 2.26 L     Impression & Recommendations:  Problem # 1:  NON-HODGKIN'S LYMPHOMA (ICD-202.80) Assessment Unchanged new onset non hodgkins lymphoma plan d/c xolair  discussed wiht dr Mariel Sleet per oncology Orders: Est. Patient Level IV (29562)  Problem # 2:  EXTRINSIC ASTHMA, UNSPECIFIED (ICD-493.00) Assessment: Unchanged Improved persistent asthma  plan leave prednisone at 5mg  /d cont accolate and dulera d/c xolair ok to continue allergy immunotherapy  Problem # 3:  SINUSITIS, CHRONIC NOS (ICD-473.9) Assessment: Unchanged chronic sinusitis with pseudomonas colonization. recent mri and sinus ct shows dz in L maxillary sinus plan cont cipro nasal saline rinse nasal hygiene to cont  Medications Added to Medication List This Visit: 1)  Prednisone 10 Mg Tabs (Prednisone) .... 1/2 tablet daily 2)  Septra Ds 800-160 Mg Tabs (Sulfamethoxazole-trimethoprim) .... Take on saturday and sunday follow chemo treatments 3)  Cipro 500 Mg Tabs (Ciprofloxacin hcl) .... Take two times a day starting tueday after chemo treatment for 7 days 4)  Acyclovir 200 Mg Caps (Acyclovir) .... 2 in am and 2  in pm 5)  Allipurinol ?mg  .... Take 1 tablet by mouth once a day 6)  Newlesta Injection  .... On sundays following chemo  Complete Medication List: 1)  Dulera 200-5 Mcg/act Aero (Mometasone furo-formoterol fum) .... 2 puffs twice per day 2)  Albuterol 90 Mcg/act Aers (Albuterol) .... One to two puff every 4 hrs as needed 3)  Nasonex 50 Mcg/act Susp (Mometasone furoate) .... Two puff ea nostril once daily 4)  Allergy Vaccine 1:10, Dtr Gives (w-e)  5)  Accolate 20 Mg Tabs (Zafirlukast) .[name] Kitchen.. 1 twice daily before meals 6)  Astelin 137 Mcg/spray Soln (Azelastine hcl) .... Two puff ea nostril two times a day 7)  Epipen 0.3 Mg/0.46ml (1:1000) Devi (Epinephrine hcl (anaphylaxis)) .... For severe allergic reaction 8)  Diovan 160 Mg Tabs (Valsartan) .... Take one (1) tablet by mouth daily 9)  Simvastatin 80 Mg Tabs (Simvastatin) .... 1/2 by mouth daily 10)  Reglan 10 Mg Tabs (Metoclopramide hcl) .... One  by mouth three times a day and  one at bedtime 11)  Metformin Hcl 500 Mg Tabs (Metformin hcl) .... 1/2 by mouth daily 12)  Tessalon 200 Mg Caps (Benzonatate) .[name] Kitchen.. 1 by mouth three times a day as needed cough,  generic ok 13)  Promethazine Vc/codeine 6.25-5-10 Mg/105ml Syrp (Phenyleph-promethazine-cod) .[name] Kitchen.. 1-2 tsp every 4-6 hrs as needed cough , may make you sleepy 14)  Prednisone 10 Mg Tabs (Prednisone) .... 1/2 tablet daily 15)  Omeprazole 20 Mg Cpdr (Omeprazole) .[name] Kitchen.. 1 by mouth two times a day 16)  Septra Ds 800-160 Mg Tabs (Sulfamethoxazole-trimethoprim) .... Take on saturday and sunday follow chemo treatments 17)  Cipro 500 Mg Tabs (Ciprofloxacin hcl) .... Take two times a day starting tueday after chemo treatment for 7 days 18)  Acyclovir 200 Mg Caps (Acyclovir) .... 2 in am and 2 in pm 19)  Allipurinol ?mg  .... Take 1 tablet by mouth once a day 20)  Newlesta Injection  .... On sundays following chemo  Other Orders: Misc. Referral (Misc. Ref) Administration xolair injection [phone])  Patient  Instructions: 1)  We will stop xolair 2)  No other medication changes 3)  Return 2 months      Medication Administration  Injection # 1:    Medication: Xolair (omalizumab) 150mg   Diagnosis: EXTRINSIC ASTHMA, UNSPECIFIED (ICD-493.00)    Route: IM    Site: R deltoid    Exp Date: 08/03/2012    Lot #: [id number]    Mfr: Mendel Ryder    Comments: xolair 300 mg, 60 units, 1.2 ml x 1 in right deltoid and 1.2 ml x 1 in left deltoid. Pt waited 30 mins.    Given by: Dimas Millin, allergy tech  Orders Added: 1)  Est. Patient Level IV [16010] 2)  Misc. Referral [Misc. Ref] 3)  Administration xolair injection [id number]   Appended Document: Pulmonary OV fax McGough, eric neijstrom

## 2010-02-02 NOTE — Progress Notes (Signed)
Summary: Needs OV posthosp  Phone Note Outgoing Call   Reason for Call: Confirm/change Appt Summary of Call: this pt will need a posthosp OV and medication calendar OV with Tammy Parrett in next two weeks Initial call taken by: Storm Frisk MD,  December 10, 2009 10:17 AM  Follow-up for Phone Call        Called, spoke with pt.  Med Cal/HFU scheduled for 12/14/09 at 10:30a with TP.  Pt aware to bring all meds to visit - OTC, scheduled, as needed.  She verbalized understanding of this.  Follow-up by: Gweneth Dimitri RN,  December 11, 2009 9:02 AM

## 2010-02-02 NOTE — Progress Notes (Signed)
Summary: omeprazole refill to [facility] Note Call from Patient Call back at [phone]   Caller: Patient Call For: wright Reason for Call: Talk to Nurse Summary of Call: spoke at appointment yesterday re: refill on omeprazole - has not been called in yet.  Please call in to pharmacy. Initial call taken by: Eugene Gavia,  September 10, 2009 11:43 AM  Follow-up for Phone Call        called spoke with patient who states that refills on omeprazole were discussed yesterday at ov, but was not sent.  rx sent to [facility] per pt's request. Boone Master CNA/MA  September 10, 2009 12:14 PM     Prescriptions: OMEPRAZOLE 20 MG CPDR (OMEPRAZOLE) 1 by mouth two times a day  #60 x 6   Entered by:   Boone Master CNA/MA   Authorized by:   Storm Frisk MD   Signed by:   Boone Master CNA/MA on 09/10/2009   Method used:   Electronically to        [facility] (retail)       [address]       [place], [place]  [address]       Ph: [phone]       Fax: [phone]   RxID:   [id number]

## 2010-02-04 NOTE — Assessment & Plan Note (Signed)
Summary: NP follow up - post hosp / med calendar   Copy to:  [facility] Primary Provider/Referring Provider:  Dr. Sherrin Daisy  CC:  post hosp follow up and states breathing is doing well but is having some yellow/green drainage from trach site x2days.[name]  History of Present Illness: 55 yo with history of asthma, chronic bronchitis, rhinitis, and vocal cord dysfunction. Has stage IV diffuse large B-celllymphoma -Dx 07/2009   December 14, 2009 --Presents for post hospital follow up. States breathing is doing well but is having some yellow/green drainage from trach site x2days. Admitted 10/29-12/1/11 and rehab until 12/10/09. Was admitted prior to this 9/12-17 for  for pneumonia.  for Resp Failure requiring vent support.   BAL-positive Pseudomonas, placed onintravenous antibiotic.  Creatinine 2.76, -acute on chronic .  Treated with IVIg for hypogammaglobulinemia on November 04, 2009.    Cranial CT scan showed significant soft tissue swelling, mucosal thickening, and air-fluid levels in the sinuses.  Underwent bilateral revision of endoscopic sinus surgery and removal of diseased tissue on November 06, 2009 per Dr. Annalee Genta.  Persistent fever with Infectious Disease followup for input,lumbar puncture November 11, 2009 negative. Tx w/  multiple, courses of antibiotics per their recommendations for multidrug-     resistant Pseudomonas , and cellular changes of HSV. negative.Echocardiogram to rule out vegetation with ejection fraction of 35%. Completed empiric course of acyclovir,    CT of the chestfollowup for pneumonia rule out cavitary lesion on November 05, 2009,negative for lesion.  Prolonged intubation with tracheostomy placed on November 18, 2009 per Dr. Annalee Genta and changed to a #4 Shiley cuffless with capping per speech therapy.  Plan was for IVIg again on December 03, 2009 per Infectious Disease.  Discharged to rehab. w/ aggressive PT/OT , she did receive IV abx during stay. [name] is out and  dressing to site.  She was discharged home on 12/10/09, and feeling better. Now on regular diet. trach site healing. PT/OT 2x/wk at home. Using walker. Has some cough w/ thick mucus.  Denies chest pain,  orthopnea, hemoptysis, fever, n/v/d, edema, headache.        September 09, 2009 3:26 PM fever for two months, pain right side and abn CT scan.  rough two months, in new job out of mold ,new building, and has seen a difference already,  green mucus in nose   pseudomonas in sinuses went to [facility] for second opinion.  NHL  positive   Medications Prior to Update: 1)  Omeprazole 20 Mg Cpdr (Omeprazole) .[name].. 1 By Mouth Two Times A Day 2)  Simvastatin 80 Mg Tabs (Simvastatin) .... 1/2 By Mouth Daily 3)  Accolate 20 Mg Tabs (Zafirlukast) .[name].. 1 Twice Daily Before Meals 4)  Acyclovir 200 Mg Caps (Acyclovir) .... 2 in Am and 2 in Pm 5)  Prednisone 10 Mg  Tabs (Prednisone) .... 1/2 Tablet Daily 6)  Dulera 200-5 Mcg/act Aero (Mometasone Furo-Formoterol Fum) .... 2 Puffs Twice Per Day 7)  Allergy Vaccine 1:10, Dtr Gives (W-E) 8)  Epipen 0.3 Mg/0.42ml (1:1000)  Devi (Epinephrine Hcl (Anaphylaxis)) .... For Severe Allergic Reaction 9)  Albuterol 90 Mcg/act  Aers (Albuterol) .... One To Two Puff Every 4 Hrs As Needed 10)  Nasonex 50 Mcg/act  Susp (Mometasone Furoate) .... Two Puff Ea Nostril Once Daily 11)  Astelin 137 Mcg/spray  Soln (Azelastine Hcl) .... Two Puff Ea Nostril Two Times A Day 12)  Diovan 160 Mg Tabs (Valsartan) .... Take One (1) Tablet By Mouth Daily 13)  Reglan 10 Mg  Tabs (Metoclopramide Hcl) .... One  By Mouth Three Times A Day and  One At Bedtime 14)  Metformin Hcl 500 Mg Tabs (Metformin Hcl) .... 1/2 By Mouth Daily 15)  Tessalon 200 Mg Caps (Benzonatate) .[name].. 1 By Mouth Three Times A Day As Needed Cough,  Generic Ok 16)  Promethazine Vc/codeine 6.25-5-10 Mg/85ml Syrp (Phenyleph-Promethazine-Cod) .[name].. 1-2 Tsp Every 4-6 Hrs As Needed Cough , May Make You Sleepy 17)  Septra Ds 800-160 Mg Tabs  (Sulfamethoxazole-Trimethoprim) .... Take On Saturday and Sunday Follow Chemo Treatments 18)  Cipro 500 Mg Tabs (Ciprofloxacin Hcl) .... Take Two Times A Day Starting Tueday After Chemo Treatment For 7 Days 19)  Allipurinol ?mg .... Take 1 Tablet By Mouth Once A Day 20)  Newlesta Injection .... On Sundays Following Chemo  Current Medications (verified): 1)  Omeprazole 20 Mg Cpdr (Omeprazole) .[name].. 1 By Mouth Two Times A Day 2)  Simvastatin 80 Mg Tabs (Simvastatin) .... Take 1 Tab By Mouth At Bedtime 3)  Citalopram Hydrobromide 20 Mg Tabs (Citalopram Hydrobromide) .... Take 1 Tablet By Mouth Every Morning 4)  Metoprolol Tartrate 25 Mg Tabs (Metoprolol Tartrate) .... 1/2 Tab By Mouth Two Times A Day 5)  Accolate 20 Mg Tabs (Zafirlukast) .[name].. 1 Twice Daily Before Meals 6)  Klor-Con M20 20 Meq Cr-Tabs (Potassium Chloride Crys Cr) .... 2 Tabs By Mouth  Every Morning and At Bedtime 7)  Acyclovir 200 Mg Caps (Acyclovir) .... 2 Capsules By Mouth  Every Morning and At Bedtime 8)  Prednisone 10 Mg  Tabs (Prednisone) .... 1/2 Tablet  Every Morning 9)  Dulera 200-5 Mcg/act Aero (Mometasone Furo-Formoterol Fum) .... 2 Puffs Twice Per Day 10)  Allergy Vaccine 1:10, Dtr Gives (W-E) .[name].. 1 Injection Every Saturday 11)  Epipen 0.3 Mg/0.96ml (1:1000)  Devi (Epinephrine Hcl (Anaphylaxis)) .... For Severe Allergic Reaction 12)  Stool Softener 100 Mg Caps (Docusate Sodium) .... As Needed For Constipation 13)  Hydrocodone-Acetaminophen 5-500 Mg Tabs (Hydrocodone-Acetaminophen) .... Take 1 Tablet By Mouth Every 6 Hours As Needed For Pain 14)  Ventolin Hfa 108 (90 Base) Mcg/act Aers (Albuterol Sulfate) .... Inhale 2 Puffs Every 4 Hours As Needed For Wheezing / Shortness of Breath 15)  Albuterol Sulfate (2.5 Mg/68ml) 0.083% Nebu (Albuterol Sulfate) .... Inhale 1 Vial Via Hhn Every 4 Hours As Needed For Wheezing / Shortness of Breath  Allergies (verified): 1)  ! Singulair (Montelukast Sodium) 2)  ! Demerol  Past  History:  Past Surgical History: Last updated: 09/09/2009 Hysterectomy Sinus Surgery Neck Surgery x 2 Breast surgery R [place] portacath  7/11  Family History: Last updated: 09/26/2008 emphysema - mother allergies - father asthma - father in childhood heart disease -  brother and mother had MI cancer - MGM (leukemia) hypercholesterolemia - brother DM - brother stroke - mother had mini-strokes  Social History: Last updated: 09/26/2008 Patient never smoked.  no alcohol drinks 3 cups coffe daily divorced 3 children Financial planner for [employer]  Risk Factors: Smoking Status: never (09/09/2009) Passive Smoke Exposure: yes (09/09/2009)  Past Medical History: ALLERGIC RHINITIS (ICD-477.9) OBSTRUCTIVE CHRONIC BRONCHITIS (ICD-491.20) VOCAL CORD DISORDER (ICD-478.5) SINUSITIS, CHRONIC NOS (ICD-473.9) GERD (ICD-530.81) ASTHMA (ICD-493.90) Non Hodgkins Lymphoma      6/11  [place] Prolonged critical illness for  pneumonia.  - Resp Failure requiring vent support.   BAL-positive Pseudomonas,    Past Pulmonary History:  Pulmonary History: Pulmonary Function Test  Date: 09/19/2008 Height (in.): 62 Gender: Female  Pre-Spirometry  FVC  Value: 2.23 L/min   Pred: 3.01 L/min     % Pred: 74 % FEV1     Value: 1.23 L     Pred: 2.26 L     % Pred: 54 % FEV1/FVC   Value: 55 %     Pred: 75 %     % Pred: 73 % FEF 25-75   Value: 0.50 L/min   Pred: 2.68 L/min     % Pred: 18 %  Comments:  Moderate to severe obstruction.  Performed at time patient was having acute asthma symptoms.  Review of Systems      See HPI  Vital Signs:  Patient profile:   55 year old female Height:      62 inches Weight:      150.38 pounds BMI:     27.60 O2 Sat:      96 % on Room air Temp:     97.3 degrees F oral Pulse rate:   86 / minute BP sitting:   100 / 70  (left arm) Cuff size:   regular  Vitals Entered By: Boone Master CNA/MA (December 14, 2009 10:48 AM)  O2 Flow:  Room air  Physical  Exam  Additional Exam:  Gen: Pleasant,  no distress , normal affect ENT:NM pale.  Neck: No JVD, no TMG, no carotid bruits, healing trach site w/ pin hole opening, no redness, yellow drainage -thick  Lungs: No use of accessory muscles, no dullness to percussion,improved air flow Cardiovascular: RRR, heart sounds normal, no murmurs or gallops, no peripheral edema Abdomen: soft and non-tender, no HSM, BS normal Musculoskeletal: No deformities, no cyanosis or clubbing Neuro: alert, non-focal Skin: along tape site at neck w/ localized irriation along tape edges.      Impression & Recommendations:  Problem # 1:  RESPIRATORY FAILURE (ICD-518.81)  s/p prolonged hospilization secondary to acute respiratiory failure from PNA-in the setting of immunocomprised pt w/ stage 4 lymphoma-on chemo. She underwent prolonged vent support requiring tracheostomy now decanulated. [name] site is healing w/ very small opening. She does have quite a bit of drainage/secretions will check wound cx. [name]  also cxr to document PNA clearance.  Meds reviewed with pt education and computerized med calendar completed.   Orders: Est. Patient Level IV (60454)  Medications Added to Medication List This Visit: 1)  Simvastatin 80 Mg Tabs (Simvastatin) .... Take 1 tab by mouth at bedtime 2)  Citalopram Hydrobromide 20 Mg Tabs (Citalopram hydrobromide) .... Take 1 tablet by mouth every morning 3)  Metoprolol Tartrate 25 Mg Tabs (Metoprolol tartrate) .... 1/2 tab by mouth two times a day 4)  Klor-con M20 20 Meq Cr-tabs (Potassium chloride crys cr) .... 2 tabs by mouth  every morning and at bedtime 5)  Acyclovir 200 Mg Caps (Acyclovir) .... 2 capsules by mouth  every morning and at bedtime 6)  Prednisone 10 Mg Tabs (Prednisone) .... 1/2 tablet  every morning 7)  Allergy Vaccine 1:10, Dtr Gives (w-e)  .[name].. 1 injection every saturday 8)  Stool Softener 100 Mg Caps (Docusate sodium) .... As needed for constipation 9)   Hydrocodone-acetaminophen 5-500 Mg Tabs (Hydrocodone-acetaminophen) .... Take 1 tablet by mouth every 6 hours as needed for pain 10)  Ventolin Hfa 108 (90 Base) Mcg/act Aers (Albuterol sulfate) .... Inhale 2 puffs every 4 hours as needed for wheezing / shortness of breath 11)  Albuterol Sulfate (2.5 Mg/17ml) 0.083% Nebu (Albuterol sulfate) .... Inhale 1 vial via hhn every 4 hours as needed for wheezing /  shortness of breath  Complete Medication List: 1)  Omeprazole 20 Mg Cpdr (Omeprazole) .[name].. 1 by mouth two times a day 2)  Simvastatin 80 Mg Tabs (Simvastatin) .... Take 1 tab by mouth at bedtime 3)  Citalopram Hydrobromide 20 Mg Tabs (Citalopram hydrobromide) .... Take 1 tablet by mouth every morning 4)  Metoprolol Tartrate 25 Mg Tabs (Metoprolol tartrate) .... 1/2 tab by mouth two times a day 5)  Accolate 20 Mg Tabs (Zafirlukast) .[name].. 1 twice daily before meals 6)  Klor-con M20 20 Meq Cr-tabs (Potassium chloride crys cr) .... 2 tabs by mouth  every morning and at bedtime 7)  Acyclovir 200 Mg Caps (Acyclovir) .... 2 capsules by mouth  every morning and at bedtime 8)  Prednisone 10 Mg Tabs (Prednisone) .... 1/2 tablet  every morning 9)  Dulera 200-5 Mcg/act Aero (Mometasone furo-formoterol fum) .... 2 puffs twice per day 10)  Allergy Vaccine 1:10, Dtr Gives (w-e)  .[name].. 1 injection every saturday 11)  Epipen 0.3 Mg/0.38ml (1:1000) Devi (Epinephrine hcl (anaphylaxis)) .... For severe allergic reaction 12)  Stool Softener 100 Mg Caps (Docusate sodium) .... As needed for constipation 13)  Hydrocodone-acetaminophen 5-500 Mg Tabs (Hydrocodone-acetaminophen) .... Take 1 tablet by mouth every 6 hours as needed for pain 14)  Ventolin Hfa 108 (90 Base) Mcg/act Aers (Albuterol sulfate) .... Inhale 2 puffs every 4 hours as needed for wheezing / shortness of breath 15)  Albuterol Sulfate (2.5 Mg/52ml) 0.083% Nebu (Albuterol sulfate) .... Inhale 1 vial via hhn every 4 hours as needed for wheezing / shortness of  breath  Other Orders: T-Culture, Wound ([id number]) T-2 View CXR ([id number])  Patient Instructions: 1)  Follow med calendar closely and bring to each visit.  2)  We will call with xray and culture results.  3)  follow up Dr. Delford Field in 2 weeks 4)  Please contact office for sooner follow up if symptoms do not improve or worsen    Immunization History:  Influenza Immunization History:    Influenza:  historical (09/03/2009)

## 2010-02-04 NOTE — Progress Notes (Signed)
Summary: NEEDS A DIFFERENT DOCTOR  Phone Note Call from Patient Call back at [phone]   Caller: Patient Call For: WRIGHT Summary of Call: PT SAW HIM LAST WEEK WAS REFERRED TO DR Donnie Coffin AND HE DOESNT TREAT THE KIND OF CANCER SHE HAS Initial call taken by: Lacinda Axon,  January 05, 2010 2:45 PM  Follow-up for Phone Call        pt states dr Renelda Loma nurse called her and told her that dr Donnie Coffin does not treat the kind of cancer she has and advised that she make appt with dr Mariel Sleet to let him know that she will not be seeing him anymore but did not offer appt with anyone else at the cancer center--pls advise if we need to make a new ref. to the cancer center--pt is only seeing dr Mariel Sleet tomorrow for clearance to get her teeth cleaned and to let him know she will be changing oncologist, because dr Renelda Loma nurse told her it would be best for this to come from the pt----pls advise Follow-up by: Philipp Deputy CMA,  January 05, 2010 4:47 PM  Additional Follow-up for Phone Call Additional follow up Details #1::        see if anyone else can see this pt and treat her lymphoma  Additional Follow-up by: Storm Frisk MD,  January 05, 2010 4:56 PM    Additional Follow-up for Phone Call Additional follow up Details #2::    [facility] for Renee at [facility] to return my call. Rhonda Cobb  January 06, 2010 9:10 AM [facility] for Luster Landsberg at [facility] to return my call ASAP. Called and spoke with patient and advised her that I was working on this issue and will call her back this afternoon with an answer. Pt stated that if she was not at home to call her on her cell phone [phone]. Alfonso Ramus  January 07, 2010 9:19 AM  [facility]. Luster Landsberg is off until Friday 01/08/10. Asked to speak with supervisor and was transferred to [facility] voice mail. LMOAM for Stephaine of the above issue and asked that she return my call ASAP. Rhonda Cobb  January 07, 2010 3:03 PM  Stephaine with the [facility]  returned my call and stated that when Dr. Renelda Loma nurse called the patient to relay what Dr. Donnie Coffin said that the nurse should have given the referral back to [facility] to get pt in with a physician there who did treat lymphoma. Stephaine asked that I refax information to her attention and she will call me back today with an appt. Referral and notes refaxed to [phone]. Waiting on appt. Alfonso Ramus  January 08, 2010 9:58 AM  Per Dr. Delford Field - Dr. Ronnie Derby to make referral to [facility] pt and advised. Pt stated that she would see Dr. Ronnie Derby this Friday 1/113/12 and if he doesn't make the referral to [facility] that she will call and let us know. Rhonda Cobb  January 11, 2010 1:57 PM No need for futher action at this time. Rhonda Cobb  January 11, 2010 1:57 PM

## 2010-02-04 NOTE — Progress Notes (Signed)
Summary: Change ABX  Phone Note Outgoing Call   Reason for Call: Discuss lab or test results Summary of Call: change augmentin to cipro 750mg  two times a day x 14days  make sure pt has appt with ENT I ordered the other day Initial call taken by: Storm Frisk MD,  January 01, 2010 11:51 AM  Follow-up for Phone Call        Called, spoke with pt.  She was informed augmentin changed to cipro 750 two times a day x 14 days per Dr Delford Field.  Informed I would call [facility] to change rx.  She verbalized understanding of this.    Pt states she does have an appt with ENT for Jan 18.   Called, [facility] - spoke with Abby.  She was informed to chagne augmentin rx to cipro 750 two times a day x 14 days #28 x0.  She verbalized understanding of this.  Follow-up by: Gweneth Dimitri RN,  January 01, 2010 12:01 PM    New/Updated Medications: CIPRO 750 MG TABS (CIPROFLOXACIN HCL) Take 1 tablet by mouth two times a day x 14 days Prescriptions: CIPRO 750 MG TABS (CIPROFLOXACIN HCL) Take 1 tablet by mouth two times a day x 14 days  #28 x 0   Entered by:   Gweneth Dimitri RN   Authorized by:   Storm Frisk MD   Signed by:   Gweneth Dimitri RN on 01/01/2010   Method used:   Telephoned to ...       [facility]* (retail)       [address]       [place], [place]  [address]       Ph: [phone]       Fax: [phone]   RxID:   [id number]

## 2010-02-04 NOTE — Assessment & Plan Note (Signed)
Summary: Pulmonary OV   Copy to:  [name] Primary [name]/Referring Dorance [name]:  Dr. Sherrin Daisy  CC:  2 wk follow up.  breathing doing well overall.  prod cough with yellow mucus, blowing brown mucus out of nose, and wheezing and chest tightness at times.  .  History of Present Illness: 55 yo with history of asthma, chronic bronchitis, rhinitis, and vocal cord dysfunction. Has stage IV diffuse large B-celllymphoma -Dx 07/2009   December 14, 2009 --Presents for post hospital follow up. States breathing is doing well but is having some yellow/green drainage from trach site x2days. Admitted 10/29-12/1/11 and rehab until 12/10/09. Was admitted prior to this 9/12-17 for  for pneumonia.  for Resp Failure requiring vent support.   BAL-positive Pseudomonas, placed onintravenous antibiotic.  Creatinine 2.76, -acute on chronic .  Treated with IVIg for hypogammaglobulinemia on November 04, 2009.    Cranial CT scan showed significant soft tissue swelling, mucosal thickening, and air-fluid levels in the sinuses.  Underwent bilateral revision of endoscopic sinus surgery and removal of diseased tissue on November 06, 2009 per Dr. Annalee Genta.  Persistent fever with Infectious Disease followup for input,lumbar puncture November 11, 2009 negative. Tx w/  multiple, courses of antibiotics per their recommendations for multidrug-     resistant Pseudomonas , and cellular changes of HSV. negative.Echocardiogram to rule out vegetation with ejection fraction of 35%. Completed empiric course of acyclovir,    CT of the chestfollowup for pneumonia rule out cavitary lesion on November 05, 2009,negative for lesion.  Prolonged intubation with tracheostomy placed on November 18, 2009 per Dr. Annalee Genta and changed to a #4 Shiley cuffless with capping per speech therapy.  Plan was for IVIg again on December 03, 2009 per Infectious Disease.  Discharged to rehab. w/ aggressive PT/OT , she did receive IV abx during stay. [name] is  out and dressing to site.  She was discharged home on 12/10/09, and feeling better. Now on regular diet. trach site healing. PT/OT 2x/wk at home. Using walker. Has some cough w/ thick mucus.  Denies chest pain,  orthopnea, hemoptysis, fever, n/v/d, edema, headache. December 30, 2009 10:01 AM Since last OV which was a posthosp OV the pt is much improved. SHe has minimal cough and dyspnea.  There is minimal postnasal drip. See last OV note for summary of extensive hosp stay with sepsis, PNA, cavitations, fungal infection, sinusitis, hypogammaglobulinemia all after chemorx for NHL.   She had a trach site infection with pos strep pneumoniae at the last ov that is now improved and trach site has healed.   The pt notes dyspnea is better.  There is an occasional wheeze.  The pt has a deep cough.  There is no chest pain.  The last gammaglobulin dose was 12/03/09.  ID recommended q4week IgG IV 400mg / kg >  The pt has finished her Vfend course.  The pt has seen cardiology and they recommend a stress test.  The pt desires a second opinion on her NHL rx.  She prefers to see dr [name] who saw her in the hospital. The pt has occ brown mucus out of the nose.     September 09, 2009 3:26 PM fever for two months, pain right side and abn CT scan.  rough two months, in new job out of mold ,new building, and has seen a difference already,  green mucus in nose   pseudomonas in sinuses went to [facility] for second opinion.  NHL  positive   Preventive Screening-Counseling &  Management  Alcohol-Tobacco     Smoking Status: never     Passive Smoke Exposure: yes  Current Medications (verified): 1)  Omeprazole 20 Mg Cpdr (Omeprazole) .[name].. 1 By Mouth Two Times A Day 2)  Simvastatin 80 Mg Tabs (Simvastatin) .... Take 1 Tab By Mouth At Bedtime 3)  Citalopram Hydrobromide 20 Mg Tabs (Citalopram Hydrobromide) .... Take 1 Tablet By Mouth Every Morning 4)  Metoprolol Tartrate 25 Mg Tabs (Metoprolol Tartrate) .... 1/2 Tab By Mouth Two  Times A Day 5)  Accolate 20 Mg Tabs (Zafirlukast) .[name].. 1 Twice Daily Before Meals 6)  Klor-Con M20 20 Meq Cr-Tabs (Potassium Chloride Crys Cr) .... 2 Tabs By Mouth  Every Morning and At Bedtime 7)  Acyclovir 200 Mg Caps (Acyclovir) .... 2 Capsules By Mouth  Every Morning and At Bedtime 8)  Prednisone 10 Mg  Tabs (Prednisone) .... 1/2 Tablet  Every Morning 9)  Dulera 200-5 Mcg/act Aero (Mometasone Furo-Formoterol Fum) .... 2 Puffs Twice Per Day 10)  Allergy Vaccine 1:10, Dtr Gives (W-E) .[name].. 1 Injection Every Saturday 11)  Epipen 0.3 Mg/0.65ml (1:1000)  Devi (Epinephrine Hcl (Anaphylaxis)) .... For Severe Allergic Reaction 12)  Stool Softener 100 Mg Caps (Docusate Sodium) .... As Needed For Constipation 13)  Hydrocodone-Acetaminophen 5-500 Mg Tabs (Hydrocodone-Acetaminophen) .... Take 1 Tablet By Mouth Every 6 Hours As Needed For Pain 14)  Ventolin Hfa 108 (90 Base) Mcg/act Aers (Albuterol Sulfate) .... Inhale 2 Puffs Every 4 Hours As Needed For Wheezing / Shortness of Breath 15)  Albuterol Sulfate (2.5 Mg/3ml) 0.083% Nebu (Albuterol Sulfate) .... Inhale 1 Vial Via Hhn Every 4 Hours As Needed For Wheezing / Shortness of Breath  Allergies (verified): 1)  ! Singulair (Montelukast Sodium) 2)  ! Demerol  Past History:  Past medical, surgical, family and social histories (including risk factors) reviewed, and no changes noted (except as noted below).  Past Medical History: ALLERGIC RHINITIS (ICD-477.9) OBSTRUCTIVE CHRONIC BRONCHITIS (ICD-491.20) VOCAL CORD DISORDER (ICD-478.5) SINUSITIS, CHRONIC NOS (ICD-473.9) GERD (ICD-530.81) ASTHMA (ICD-493.90) Non Hodgkins Lymphoma      6/11  [place] Prolonged critical illness for  pneumonia.  - Resp Failure requiring vent support.   BAL-positive Pseudomonas,  cavitary lung disease c/w fungal infection never proven.  Past Surgical History: Reviewed history from 09/09/2009 and no changes required. Hysterectomy Sinus Surgery Neck Surgery x 2 Breast  surgery [place] portacath  7/11  Past Pulmonary History:  Pulmonary History: Pulmonary Function Test  Date: 09/19/2008 Height (in.): 62 Gender: Female  Pre-Spirometry  FVC     Value: 2.23 L/min   Pred: 3.01 L/min     % Pred: 74 % FEV1     Value: 1.23 L     Pred: 2.26 L     % Pred: 54 % FEV1/FVC   Value: 55 %     Pred: 75 %     % Pred: 73 % FEF 25-75   Value: 0.50 L/min   Pred: 2.68 L/min     % Pred: 18 %  Comments:  Moderate to severe obstruction.  Performed at time patient was having acute asthma symptoms.  Family History: Reviewed history from 09/26/2008 and no changes required. emphysema - mother allergies - father asthma - father in childhood heart disease -  brother and mother had MI cancer - MGM (leukemia) hypercholesterolemia - brother DM - brother stroke - mother had mini-strokes  Social History: Reviewed history from 09/26/2008 and no changes required. Patient never smoked.  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  The patient denies shortness of breath at rest, non-productive cough, coughing up blood, chest pain, irregular heartbeats, acid heartburn, indigestion, loss of appetite, weight change, abdominal pain, difficulty swallowing, sore throat, tooth/dental problems, headaches, sneezing, itching, ear ache, anxiety, depression, hand/feet swelling, joint stiffness or pain, rash, and fever.    Vital Signs:  Patient profile:   55 year old female Height:      62 inches Weight:      148.25 pounds BMI:     27.21 O2 Sat:      98 % on Room air Temp:     97.9 degrees F oral Pulse rate:   65 / minute BP sitting:   108 / 70  (left arm) Cuff size:   regular  Vitals Entered By: Gweneth Dimitri RN (December 30, 2009 9:54 AM)  O2 Flow:  Room  air CC: 2 wk follow up.  breathing doing well overall.  prod cough with yellow mucus, blowing brown mucus out of nose, wheezing and chest tightness at times.    Does patient need assistance? Functional Status Self care Comments Medications reviewed with patient Daytime contact number verified with patient. Gweneth Dimitri RN  December 30, 2009 9:54 AM    Physical Exam  Additional Exam:  Gen: Pleasant,  no distress , normal affect ENT:NM pale.  Neck: No JVD, no TMG, no carotid bruits, healed  trach site, no opening in trach site any longer Lungs: No use of accessory muscles, no dullness to percussion,improved air flow Cardiovascular: RRR, heart sounds normal, no murmurs or gallops, no peripheral edema Abdomen: soft and non-tender, no HSM, BS normal Musculoskeletal: No deformities, no cyanosis or clubbing Neuro: alert, non-focal Skin:clear      Impression & Recommendations:  Problem # 1:  HYPOGAMMAGLOBULINEMIA (ICD-279.00) Assessment Unchanged Ongoing hypogammaglobulinemia. plan q4weeks IV IgG 400mg /kg>>set up another dose at short stay Orders: New Patient Level V (16109) Misc. Referral (Misc. Ref)  Problem # 2:  EXTRINSIC ASTHMA, UNSPECIFIED (ICD-493.00) Assessment: Unchanged severe persistent asthma steroid dependent in the past plan cont dulera no systemic steroids for now  Problem # 3:  SINUSITIS, CHRONIC NOS (ICD-473.9) Assessment: Improved chronic sinusitis, pseudomonas was cultured in hospital plan reimage sinuses ENT f/u  Problem # 4:  NON-HODGKIN'S LYMPHOMA (ICD-202.80) Assessment: Improved good response to CRX for NHL plan obtain second opinion on further Rx with [facility]  Orders: New Patient Level V [id number]) Oncology Referral (Oncology)  Problem # 5:  PNEUMONIA DUE TO OTHER GRAM-NEGATIVE BACTERIA (ICD-482.83) Assessment: Improved pseudomonas pneumonia and also possible fungal cavitary pneumonia plan no further ABX f/u CT Chest  The  following medications were removed from the medication list:    Levaquin 500 Mg Tabs (Levofloxacin) .[name]... 1 by mouth once daily  Complete Medication List: 1)  Omeprazole 20 Mg Cpdr (Omeprazole) .[name].. 1 by mouth two times a day 2)  Simvastatin 80 Mg Tabs (Simvastatin) .... Take 1 tab by mouth at bedtime 3)  Citalopram Hydrobromide 20 Mg Tabs (Citalopram hydrobromide) .... Take 1 tablet by mouth every morning 4)  Metoprolol Tartrate 25 Mg Tabs (Metoprolol tartrate) .... 1/2 tab by mouth two times a day 5)  Accolate 20 Mg Tabs (Zafirlukast) .[name].. 1 twice daily before meals 6)  Klor-con M20 20 Meq Cr-tabs (Potassium  chloride crys cr) .... 2 tabs by mouth  every morning and at bedtime 7)  Acyclovir 200 Mg Caps (Acyclovir) .... 2 capsules by mouth  every morning and at bedtime 8)  Prednisone 10 Mg Tabs (Prednisone) .... 1/2 tablet  every morning 9)  Dulera 200-5 Mcg/act Aero (Mometasone furo-formoterol fum) .... 2 puffs twice per day 10)  Allergy Vaccine 1:10, Dtr Gives (w-e)  .[name].. 1 injection every saturday 11)  Epipen 0.3 Mg/0.24ml (1:1000) Devi (Epinephrine hcl (anaphylaxis)) .... For severe allergic reaction 12)  Stool Softener 100 Mg Caps (Docusate sodium) .... As needed for constipation 13)  Hydrocodone-acetaminophen 5-500 Mg Tabs (Hydrocodone-acetaminophen) .... Take 1 tablet by mouth every 6 hours as needed for pain 14)  Ventolin Hfa 108 (90 Base) Mcg/act Aers (Albuterol sulfate) .... Inhale 2 puffs every 4 hours as needed for wheezing / shortness of breath 15)  Albuterol Sulfate (2.5 Mg/54ml) 0.083% Nebu (Albuterol sulfate) .... Inhale 1 vial via hhn every 4 hours as needed for wheezing / shortness of breath  Other Orders: Radiology Referral (Radiology) ENT Referral (ENT)  Patient Instructions: 1)  A consult with peter  rubin of oncology and [name] of ENT will be obtained 2)  An infusion of gammaglobulin IgG will be given at short stay 3)  CT of sinus and chest will be obtained 4)   No change in medications for now 5)  Return one month Prescriptions: VENTOLIN HFA 108 (90 BASE) MCG/ACT AERS (ALBUTEROL SULFATE) inhale 2 puffs every 4 hours as needed for wheezing / shortness of breath  #1 x 6   Entered by:   Gweneth Dimitri RN   Authorized by:   Storm Frisk MD   Signed by:   Gweneth Dimitri RN on 12/30/2009   Method used:   Electronically to        [facility]* (retail)       [address] [place], [place]  [address]       Ph: [phone]       Fax: [phone]   RxID:   [id number] DULERA 200-5 MCG/ACT AERO (MOMETASONE FURO-FORMOTEROL FUM) 2 puffs twice per day Brand medically necessary #1 x 6   Entered by:   Gweneth Dimitri RN   Authorized by:   Storm Frisk MD   Signed by:   Gweneth Dimitri RN on 12/30/2009   Method used:   Electronically to        [facility]* (retail)       [address] [place], [place]  [address]       Ph: [phone]       Fax: [phone]   RxID:   [id number] ACCOLATE 20 MG TABS (ZAFIRLUKAST) 1 twice daily before meals  #60 x 6   Entered by:   Gweneth Dimitri RN   Authorized by:   Storm Frisk MD   Signed by:   Gweneth Dimitri RN on 12/30/2009   Method used:   Electronically to        [facility] (retail)       [address] [place], [place]  [address]       Ph: [phone]       Fax: [phone]   RxID:   [id number]

## 2010-02-04 NOTE — Progress Notes (Signed)
Summary: call report-ATC X 4 LINE BUSY-NOT AT WORK NUMBER  Phone Note Other Incoming   Caller: Rebecka Apley with [facility] Radiology Summary of Call: Call report on CT Chest and CT sinus -- will fax report to traige fax #. Reports received and given to Philipp Deputy to have  Dr. Maple Hudson address.  Initial call taken by: Gweneth Dimitri RN,  December 31, 2009 4:07 PM  Follow-up for Phone Call        CDY has looked at the results and states that the CT chest is much improved(resolving infection), CT sinus shows acute sinusitis-recommend she take Augmentin 875mg  #20 take 1 by mouth two times a day x 10 days no refills and follow up with PW as told.  Additional Follow-up for Phone Call Additional follow up Details #1::        atc pt home phone number put was busy. Called her work phone number and she is out on sick leave. will try back later [facility]  December 31, 2009 5:15 PM   spoke with patient-she is aware of results and Rx per CDY sent to [facility] at patients request.Katie [facility] CMA  January 01, 2010 11:53 AM   Pt verbalized understanding and appt for 02-02-10 with PW.Katie [facility] CMA  January 01, 2010 11:54 AM     Additional Follow-up for Phone Call Additional follow up Details #2::    I would change augmentin to Cipro 750mg  by mouth two times a day x 14 days this pt has prior hx of pseudomonas in sinuses make sure the appt with ENT I ordered the other day is carried out Follow-up by: Storm Frisk MD,  January 01, 2010 11:50 AM  New/Updated Medications: AUGMENTIN 875-125 MG TABS (AMOXICILLIN-POT CLAVULANATE) take 1 by mouth two times a day Prescriptions: AUGMENTIN 875-125 MG TABS (AMOXICILLIN-POT CLAVULANATE) take 1 by mouth two times a day  #20 x 0   Entered by:   Reynaldo Minium CMA   Authorized by:   Waymon Budge MD   Signed by:   Reynaldo Minium CMA on 01/01/2010   Method used:   Electronically to        [facility] (retail)       [address]  [place], [place]  [address]       Ph: [phone]       Fax: [phone]   RxID:   [id number]

## 2010-02-04 NOTE — Consult Note (Signed)
Summary: [facility]  [facility]   Imported By: Sherian Rein 01/27/2010 08:29:06  _____________________________________________________________________  External Attachment:    Type:   Image     Comment:   External Document

## 2010-02-04 NOTE — Progress Notes (Signed)
Summary: [facility] short stay - needs this order IMMEDIATELY  Phone Note From Other Clinic   Caller: sonia w/ [facility] shortstay Call For: Egypt Marchiano Summary of Call: needs order ASAP (pt is there now) re: portacath. needs order to read: "flush portacath protocol w/ 500 units - 5cc heprin flush. fax to sonia immediately [phone]. contact # [phone] Initial call taken by: Tivis Ringer, CNA,  December 31, 2009 9:00 AM  Follow-up for Phone Call        Spoke with Lissa Hoard at short stay and she needs an order stating to Flush portacath per protocol with 500units/5cc heparin flush. Please advise if ok to send order. Thanks. Carron Curie CMA  December 31, 2009 9:08 AM   Additional Follow-up for Phone Call Additional follow up Details #1::        ok to send order now Additional Follow-up by: Storm Frisk MD,  December 31, 2009 9:10 AM    Additional Follow-up for Phone Call Additional follow up Details #2::    order faxed to number given attn sonia. Carron Curie CMA  December 31, 2009 9:11 AM

## 2010-02-04 NOTE — Progress Notes (Signed)
  Phone Note Other Incoming   Request: Send information Summary of Call: Request for records received from [facility]. Request forwarded to [facility].     

## 2010-02-04 NOTE — Progress Notes (Signed)
Summary: dr Mariel Sleet for dr Delford Field  Phone Note From Other Clinic   Caller: dr Mariel Sleet Call For: Delford Field Summary of Call: dr requests to speak to dr Delford Field "at his convenience" re: pt.  dr's # [phone] Initial call taken by: Tivis Ringer, CNA,  January 06, 2010 4:43 PM  Follow-up for Phone Call        done Follow-up by: Storm Frisk MD,  January 06, 2010 5:10 PM

## 2010-02-04 NOTE — Consult Note (Signed)
[name], [name]                 ACCOUNT NO.:  [id number]  MEDICAL RECORD NO.:  [id number]          PATIENT TYPE:  INP  LOCATION:  2909                         FACILITY:  [facility]  PHYSICIAN:  Cristi Loron, M.D.DATE OF BIRTH:  05-15-1955  DATE OF CONSULTATION:  01/24/2010 DATE OF DISCHARGE:                                CONSULTATION   CHIEF COMPLAINT:  Neck pain, shoulder weakness.  HISTORY OF PRESENT ILLNESS:  The patient is a 55 year old white female who I first performed a C6-7 anterior cervical diskectomy and fusion approximately in 1999.  The patient subsequently developed degenerative changes and stenosis at C5-6.  I performed a anterior cervical diskectomy and fusion plating there in approximately 2003.  The patient has done well and I have not seen her in quite some time.  Unfortunately, the patient has developed lot of other medical problems including stage IV lymphoma.  She had a bout of sepsis and was hospitalized for greater than a month a few months ago with respiratory failure, etc.  The patient was being seen by Dr. Milinda Cave in [place] and she had syncopal event.  They initially offered to call EMS but the patient preferred to go to [facility] via private vehicle.  She was evaluated there and subsequently transported down to [facility] for further care.  During this hospitalization, the patient has complained of neck pain with some numbness to hands.  She has had weakness in her shoulders. She was worked up with head CT which was unremarkable and cervical CT which demonstrated her prior fusions look good, but she did have some stenosis and central herniated disk at C4-5 and a neurosurgical consultation requested by Dr. Tyson Alias.  Presently, the patient is accompanied by her daughter.  She relates above history.  She complains of some neck pain.  It is rather diffuse. She is not well localized.  She also has noted some numbness in  her hands.  She is unable to flex her deltoid bilaterally.  She tells me she was doing fairly well prior to her fall, i.e., has never had any significant troubles prior to this fall.  PAST MEDICAL HISTORY:  Positive for stage IV large B-cell lymphoma, chronic sinusitis, respiratory failure tracheostomy, sepsis secondary to pneumonia, deep venous thrombosis, chronic renal failure, gastroesophageal reflux disease, type 2 diabetes mellitus, depression. She has been diagnosed with neuropathy.  PAST SURGICAL HISTORY:  She had nasal surgery in November 2011, cervical fusion by me as above, hysterectomy, lumpectomy, tracheostomy, vertebroplasty.  FAMILY MEDICAL HISTORY:  Positive for coronary disease.  SOCIAL HISTORY:  The patient lives with her daughter.  Denies tobacco, ethanol, or drug use.  REVIEW OF SYSTEMS:  Negative except as above.  PHYSICAL EXAMINATION:  GENERAL:  A pleasant 55 year old white female in no apparent distress, is wearing a soft collar. HEENT:  Alopecia.  Her pupils are equal, round, and reactive to light. Extraocular muscles are intact.  Oropharynx benign. NECK:  Supple.  There are some masses deformities, tracheal deviation. She does have a significant tenderness to palpation or point tenderness. She does have some tenderness to range of  motion.  Spurling's testing is equivocal causing neck and trapezius pain.  Lhermitte sign was not present with limited range of motion.  Thorax is symmetric.  ABDOMEN: Soft. EXTREMITIES:  No obvious deformities. NEUROLOGIC:  The patient is alert and oriented x3.  Cranial nerves II- XII are examined bilaterally and grossly normal vision, hearing grossly normal.  Motor strength is 5/5 in biceps, triceps, wrist extensor, interosseous, psoas, quadriceps, dorsiflexors, gastrocnemius.  She has weakness in her handgrip at 4-4+/5 bilaterally.  Her deltoid strength is 1/5 bilaterally.  She maintains supraspinatus abduction of the  shoulder bilaterally to approximately 20 degrees.  Deep tendon reflexes are 1/4 in biceps, triceps, brachioradialis, trace in quadriceps, absent in gastrocnemius.  There is no ankle clonus.  Sensory function is intact to light touch and sensation in all dermatomes bilaterally.  Cerebellar function is intact to rapid alternating movements of the upper extremities bilaterally.  Imaging studies are reviewed.  The patient's head scan performed January 23, 2010 at [facility] demonstrates no acute findings.  I also reviewed the patient's cervical scan performed January 23, 2010. It demonstrates she has a anterior cervical plate at E7-0 which is well positioned.  She had interbody fusion at C5-6 and uninstrumented fusion at C6-7.  At C4-5, the patient has a central bulging ruptured disk with some spinal stenosis but it does not appear severe.  I do not see any evidence of fractures, subluxations, abnormal soft tissue, etc.  ASSESSMENT/PLAN:  Cervicalgia and cervical myelopathy/radiculopathy.  I discussed the situation with the patient and daughter and recommend we work up further with a cervical MRI to rule out significant stenosis or neural compression at C4-5.  I discussed the situation in general terms with them.  She obviously has quite few medical problems and elected to avoid surgery if possible in this unhealthy healthy individual.  I have answered all the patient and her daughter's questions.     Cristi Loron, M.D.     JDJ/MEDQ  D:  01/24/2010  T:  01/24/2010  Job:  [id number]  cc:   Jeoffrey Massed, MD  Electronically Signed by Tressie Stalker M.D. on 02/04/2010 10:57:31 AM

## 2010-02-04 NOTE — Op Note (Signed)
Summary: Order for Flush Portacath/[place]  Order for Flush Portacath/[place]   Imported By: Sherian Rein 01/12/2010 08:24:08  _____________________________________________________________________  External Attachment:    Type:   Image     Comment:   External Document

## 2010-02-08 ENCOUNTER — Other Ambulatory Visit (HOSPITAL_COMMUNITY): Payer: Self-pay | Admitting: Oncology

## 2010-02-08 DIAGNOSIS — C8510 Unspecified B-cell lymphoma, unspecified site: Secondary | ICD-10-CM

## 2010-02-10 NOTE — Assessment & Plan Note (Signed)
Summary: Pulmonary OV   Copy to:  [facility] Primary Provider/Referring Provider:  Dr. Sherrin Daisy  CC:  1 month follow up.  Pt recently in [facility] for syncal episode.  States breathing is going "good."  Nonprod cough.  Denies SOB, wheezing, and chest tightness.[name] Kitchen  History of Present Illness: 55 yo with history of asthma, chronic bronchitis, rhinitis, and vocal cord dysfunction. Has stage IV diffuse large B-celllymphoma -Dx 07/2009   December 14, 2009 --Presents for post hospital follow up. States breathing is doing well but is having some yellow/green drainage from trach site x2days. Admitted 10/29-12/1/11 and rehab until 12/10/09. Was admitted prior to this 9/12-17 for  for pneumonia.  for Resp Failure requiring vent support.   BAL-positive Pseudomonas, placed onintravenous antibiotic.  Creatinine 2.76, -acute on chronic .  Treated with IVIg for hypogammaglobulinemia on November 04, 2009.    Cranial CT scan showed significant soft tissue swelling, mucosal thickening, and air-fluid levels in the sinuses.  Underwent bilateral revision of endoscopic sinus surgery and removal of diseased tissue on November 06, 2009 per Dr. Annalee Genta.  Persistent fever with Infectious Disease followup for input,lumbar puncture November 11, 2009 negative. Tx w/  multiple, courses of antibiotics per their recommendations for multidrug-     resistant Pseudomonas , and cellular changes of HSV. negative.Echocardiogram to rule out vegetation with ejection fraction of 35%. Completed empiric course of acyclovir,    CT of the chestfollowup for pneumonia rule out cavitary lesion on November 05, 2009,negative for lesion.  Prolonged intubation with tracheostomy placed on November 18, 2009 per Dr. Annalee Genta and changed to a #4 Shiley cuffless with capping per speech therapy.  Plan was for IVIg again on December 03, 2009 per Infectious Disease.  Discharged to rehab. w/ aggressive PT/OT , she did receive IV abx during stay. [name] is  out and dressing to site.  She was discharged home on 12/10/09, and feeling better. Now on regular diet. trach site healing. PT/OT 2x/wk at home. Using walker. Has some cough w/ thick mucus.  Denies chest pain,  orthopnea, hemoptysis, fever, n/v/d, edema, headache. December 30, 2009 10:01 AM Since last OV which was a posthosp OV the pt is much improved. SHe has minimal cough and dyspnea.  There is minimal postnasal drip. See last OV note for summary of extensive hosp stay with sepsis, PNA, cavitations, fungal infection, sinusitis, hypogammaglobulinemia all after chemorx for NHL.   She had a trach site infection with pos strep pneumoniae at the last ov that is now improved and trach site has healed.   The pt notes dyspnea is better.  There is an occasional wheeze.  The pt has a deep cough.  There is no chest pain.  The last gammaglobulin dose was 12/03/09.  ID recommended q4week IgG IV 400mg / kg >  The pt has finished her Vfend course.  The pt has seen cardiology and they recommend a stress test.  The pt desires a second opinion on her NHL rx.  She prefers to see dr Donnie Coffin who saw her in the hospital. The pt has occ brown mucus out of the nose.     February 02, 2010 3:16 PM  no further pass out spells not much cough.  has mucus in the hosp but none since.  has a dry hack  no real chest pain  went to [facility].  had blood work and tft levels and f/u with neijstrom.  PET to be scheduled,  if in remission: rituxan every 6 months no  more chop.  ?stem cell tpl ? ent says sinuses are clear September 09, 2009 3:26 PM fever for two months, pain right side and abn CT scan.  rough two months, in new job out of mold ,new building, and has seen a difference already,  green mucus in nose   pseudomonas in sinuses went to [facility] for second opinion.  NHL  positive   Current Medications (verified): 1)  Omeprazole 20 Mg Cpdr (Omeprazole) .[name] Kitchen.. 1 By Mouth Two Times A Day 2)  Simvastatin 80 Mg Tabs (Simvastatin) .... Take  1 Tab By Mouth At Bedtime 3)  Citalopram Hydrobromide 20 Mg Tabs (Citalopram Hydrobromide) .... Take 1 Tablet By Mouth Every Morning 4)  Accolate 20 Mg Tabs (Zafirlukast) .[name] Kitchen.. 1 Twice Daily Before Meals 5)  Potassium Chloride 40 Meq/90ml (20%) Liqd (Potassium Chloride) .... 2 Tablespoons Two Times A Day 6)  Acyclovir 200 Mg Caps (Acyclovir) .... 2 Capsules By Mouth  Every Morning and At Bedtime 7)  Prednisone 10 Mg  Tabs (Prednisone) .... 1/2 Tablet  Every Morning 8)  Dulera 200-5 Mcg/act Aero (Mometasone Furo-Formoterol Fum) .... 2 Puffs Twice Per Day 9)  Allergy Vaccine 1:10, Dtr Gives (W-E) .[name] Kitchen.. 1 Injection Every Saturday 10)  Epipen 0.3 Mg/0.57ml (1:1000)  Devi (Epinephrine Hcl (Anaphylaxis)) .... For Severe Allergic Reaction 11)  Stool Softener 100 Mg Caps (Docusate Sodium) .... As Needed For Constipation 12)  Hydrocodone-Acetaminophen 5-500 Mg Tabs (Hydrocodone-Acetaminophen) .... Take 1 Tablet By Mouth Every 6 Hours As Needed For Pain 13)  Ventolin Hfa 108 (90 Base) Mcg/act Aers (Albuterol Sulfate) .... Inhale 2 Puffs Every 4 Hours As Needed For Wheezing / Shortness of Breath 14)  Albuterol Sulfate (2.5 Mg/70ml) 0.083% Nebu (Albuterol Sulfate) .... Inhale 1 Vial Via Hhn Every 4 Hours As Needed For Wheezing / Shortness of Breath 15)  Coumadin 5 Mg Tabs (Warfarin Sodium) .... Take As Directed 16)  Coumadin 1 Mg Tabs (Warfarin Sodium) .... Take As Directed  Allergies (verified): 1)  ! Singulair (Montelukast Sodium) 2)  ! Demerol  Past History:  Past medical, surgical, family and social histories (including risk factors) reviewed, and no changes noted (except as noted below).  Past Medical History: Reviewed history from 12/30/2009 and no changes required. ALLERGIC RHINITIS (ICD-477.9) OBSTRUCTIVE CHRONIC BRONCHITIS (ICD-491.20) VOCAL CORD DISORDER (ICD-478.5) SINUSITIS, CHRONIC NOS (ICD-473.9) GERD (ICD-530.81) ASTHMA (ICD-493.90) Non Hodgkins Lymphoma      6/11   Neijstrom Prolonged critical illness for  pneumonia.  - Resp Failure requiring vent support.   BAL-positive Pseudomonas,  cavitary lung disease c/w fungal infection never proven.  Past Surgical History: Reviewed history from 09/09/2009 and no changes required. Hysterectomy Sinus Surgery Neck Surgery x 2 Breast surgery R [place] portacath  7/11  Past Pulmonary History:  Pulmonary History: Pulmonary Function Test  Date: 09/19/2008 Height (in.): 62 Gender: Female  Pre-Spirometry  FVC     Value: 2.23 L/min   Pred: 3.01 L/min     % Pred: 74 % FEV1     Value: 1.23 L     Pred: 2.26 L     % Pred: 54 % FEV1/FVC   Value: 55 %     Pred: 75 %     % Pred: 73 % FEF 25-75   Value: 0.50 L/min   Pred: 2.68 L/min     % Pred: 18 %  Comments:  Moderate to severe obstruction.  Performed at time patient was having acute asthma symptoms.  Family History: Reviewed history from 09/26/2008 and no  changes required. emphysema - mother allergies - father asthma - father in childhood heart disease -  brother and mother had MI cancer - MGM (leukemia) hypercholesterolemia - brother DM - brother stroke - mother had mini-strokes  Social History: Reviewed history from 09/26/2008 and no changes required. Patient never smoked.  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  The patient denies shortness of breath at rest, productive cough, non-productive cough, coughing up blood, chest pain, irregular heartbeats, acid heartburn, indigestion, loss of appetite, weight change, abdominal pain, difficulty swallowing, sore throat, tooth/dental problems, headaches, nasal congestion/difficulty breathing through nose, sneezing, itching, ear ache, anxiety, depression, hand/feet swelling, joint stiffness or pain, rash, change in color of mucus, and fever.    Vital Signs:  Patient profile:   55  year old female Height:      62 inches Weight:      154.50 pounds BMI:     28.36 O2 Sat:      96 % on Room air Temp:     97.9 degrees F oral Pulse rate:   83 / minute BP sitting:   114 / 80  (right arm) Cuff size:   regular  Vitals Entered By: Gweneth Dimitri RN (February 02, 2010 2:59 PM)  O2 Flow:  Room air CC: 1 month follow up.  Pt recently in [facility] for syncal episode.  States breathing is going "good."  Nonprod cough.  Denies SOB, wheezing, chest tightness. Comments Medications reviewed with patient Daytime contact number verified with patient. Gweneth Dimitri RN  February 02, 2010 3:00 PM    Physical Exam  Additional Exam:  Gen: Pleasant,  no distress , normal affect ENT:NM pale.  Neck: No JVD, no TMG, no carotid bruits, healed  trach site, no opening in trach site any longer Lungs: No use of accessory muscles, no dullness to percussion,improved air flow Cardiovascular: RRR, heart sounds normal, no murmurs or gallops, no peripheral edema Abdomen: soft and non-tender, no HSM, BS normal Musculoskeletal: No deformities, no cyanosis or clubbing Neuro: alert, non-focal Skin:clear      Impression & Recommendations:  Problem # 1:  PNEUMONIA DUE TO OTHER GRAM-NEGATIVE BACTERIA (ICD-482.83) Assessment Improved resolved pneumonia due to pseudomonas plan  no further abx  The following medications were removed from the medication list:    Cipro 750 Mg Tabs (Ciprofloxacin hcl) .[name] Kitchen... Take 1 tablet by mouth two times a day x 14 days  Orders: Est. Patient Level IV (16109)  Problem # 2:  SINUSITIS, CHRONIC NOS (ICD-473.9) Assessment: Improved per ent resolved sinusitis plan cont nasal hygiene  Problem # 3:  EXTRINSIC ASTHMA, UNSPECIFIED (ICD-493.00) Assessment: Unchanged  severe persistent asthma steroid dependent in the past plan cont dulera no systemic steroids for now  Medications Added to Medication List This Visit: 1)  Potassium Chloride 40 Meq/56ml (20%) Liqd  (Potassium chloride) .... 2 tablespoons two times a day 2)  Coumadin 5 Mg Tabs (Warfarin sodium) .... Take as directed 3)  Coumadin 1 Mg Tabs (Warfarin sodium) .... Take as directed  Complete Medication List: 1)  Omeprazole 20 Mg Cpdr (Omeprazole) .[name] Kitchen.. 1 by mouth two times a day 2)  Simvastatin 80 Mg Tabs (Simvastatin) .... Take 1 tab by mouth at bedtime 3)  Citalopram Hydrobromide 20 Mg Tabs (Citalopram hydrobromide) .... Take 1 tablet by mouth every morning 4)  Accolate 20 Mg Tabs (  Zafirlukast) .[name] Kitchen.. 1 twice daily before meals 5)  Potassium Chloride 40 Meq/67ml (20%) Liqd (Potassium chloride) .... 2 tablespoons two times a day 6)  Acyclovir 200 Mg Caps (Acyclovir) .... 2 capsules by mouth  every morning and at bedtime 7)  Prednisone 10 Mg Tabs (Prednisone) .... 1/2 tablet  every morning 8)  Dulera 200-5 Mcg/act Aero (Mometasone furo-formoterol fum) .... 2 puffs twice per day 9)  Allergy Vaccine 1:10, Dtr Gives (w-e)  .[name] Kitchen.. 1 injection every saturday 10)  Epipen 0.3 Mg/0.62ml (1:1000) Devi (Epinephrine hcl (anaphylaxis)) .... For severe allergic reaction 11)  Stool Softener 100 Mg Caps (Docusate sodium) .... As needed for constipation 12)  Hydrocodone-acetaminophen 5-500 Mg Tabs (Hydrocodone-acetaminophen) .... Take 1 tablet by mouth every 6 hours as needed for pain 13)  Ventolin Hfa 108 (90 Base) Mcg/act Aers (Albuterol sulfate) .... Inhale 2 puffs every 4 hours as needed for wheezing / shortness of breath 14)  Albuterol Sulfate (2.5 Mg/22ml) 0.083% Nebu (Albuterol sulfate) .... Inhale 1 vial via hhn every 4 hours as needed for wheezing / shortness of breath 15)  Coumadin 5 Mg Tabs (Warfarin sodium) .... Take as directed 16)  Coumadin 1 Mg Tabs (Warfarin sodium) .... Take as directed  Patient Instructions: 1)  No change in medications 2)  Return in      2     months   Appended Document: Pulmonary OV fax eric neijstrom; Dr Regino Schultze

## 2010-02-17 ENCOUNTER — Encounter (HOSPITAL_COMMUNITY): Payer: BC Managed Care – PPO | Attending: Oncology

## 2010-02-17 ENCOUNTER — Encounter (HOSPITAL_COMMUNITY): Payer: Self-pay

## 2010-02-17 ENCOUNTER — Encounter (HOSPITAL_COMMUNITY)
Admission: RE | Admit: 2010-02-17 | Discharge: 2010-02-17 | Disposition: A | Discharge status: Home / Self Care | Payer: BC Managed Care – PPO | Source: Ambulatory Visit | Attending: Oncology | Admitting: Oncology

## 2010-02-17 ENCOUNTER — Other Ambulatory Visit (HOSPITAL_COMMUNITY): Payer: BC Managed Care – PPO

## 2010-02-17 DIAGNOSIS — C8589 Other specified types of non-Hodgkin lymphoma, extranodal and solid organ sites: Secondary | ICD-10-CM

## 2010-02-17 DIAGNOSIS — C8510 Unspecified B-cell lymphoma, unspecified site: Secondary | ICD-10-CM

## 2010-02-17 LAB — GLUCOSE, CAPILLARY: Glucose-Capillary: 116 mg/dL — ABNORMAL HIGH (ref 70–99)

## 2010-02-17 MED ORDER — FLUDEOXYGLUCOSE F - 18 (FDG) INJECTION
16.7000 | Freq: Once | INTRAVENOUS | Status: AC | PRN
  Administered 2010-02-17: 16.7 via INTRAVENOUS

## 2010-02-24 ENCOUNTER — Other Ambulatory Visit (HOSPITAL_COMMUNITY): Payer: Self-pay | Admitting: Oncology

## 2010-02-24 DIAGNOSIS — C8590 Non-Hodgkin lymphoma, unspecified, unspecified site: Secondary | ICD-10-CM

## 2010-02-24 DIAGNOSIS — R0989 Other specified symptoms and signs involving the circulatory and respiratory systems: Secondary | ICD-10-CM

## 2010-02-24 DIAGNOSIS — R509 Fever, unspecified: Secondary | ICD-10-CM

## 2010-02-24 DIAGNOSIS — R05 Cough: Secondary | ICD-10-CM

## 2010-02-24 DIAGNOSIS — R059 Cough, unspecified: Secondary | ICD-10-CM

## 2010-03-05 ENCOUNTER — Ambulatory Visit (HOSPITAL_COMMUNITY): Payer: BC Managed Care – PPO

## 2010-03-05 ENCOUNTER — Inpatient Hospital Stay (HOSPITAL_COMMUNITY): Payer: BC Managed Care – PPO

## 2010-03-05 DIAGNOSIS — Z5112 Encounter for antineoplastic immunotherapy: Secondary | ICD-10-CM

## 2010-03-05 DIAGNOSIS — C8589 Other specified types of non-Hodgkin lymphoma, extranodal and solid organ sites: Secondary | ICD-10-CM

## 2010-03-12 ENCOUNTER — Inpatient Hospital Stay (HOSPITAL_COMMUNITY): Payer: BC Managed Care – PPO

## 2010-03-12 ENCOUNTER — Encounter (HOSPITAL_COMMUNITY): Payer: BC Managed Care – PPO

## 2010-03-12 ENCOUNTER — Encounter (HOSPITAL_COMMUNITY): Payer: BC Managed Care – PPO | Attending: Oncology

## 2010-03-12 DIAGNOSIS — C8589 Other specified types of non-Hodgkin lymphoma, extranodal and solid organ sites: Secondary | ICD-10-CM

## 2010-03-12 DIAGNOSIS — Z5112 Encounter for antineoplastic immunotherapy: Secondary | ICD-10-CM

## 2010-03-15 LAB — DIFFERENTIAL
Basophils Absolute: 0 10*3/uL (ref 0.0–0.1)
Basophils Absolute: 0.1 10*3/uL (ref 0.0–0.1)
Basophils Relative: 1 % (ref 0–1)
Eosinophils Absolute: 0.7 10*3/uL (ref 0.0–0.7)
Eosinophils Absolute: 0.7 10*3/uL (ref 0.0–0.7)
Eosinophils Relative: 9 % — ABNORMAL HIGH (ref 0–5)
Lymphocytes Relative: 11 % — ABNORMAL LOW (ref 12–46)
Monocytes Relative: 8 % (ref 3–12)
Neutro Abs: 7 10*3/uL (ref 1.7–7.7)
Neutrophils Relative %: 73 % (ref 43–77)
Neutrophils Relative %: 75 % (ref 43–77)

## 2010-03-15 LAB — CROSSMATCH
ABO/RH(D): O POS
Unit division: 0

## 2010-03-15 LAB — CBC
HCT: 22.3 % — ABNORMAL LOW (ref 36.0–46.0)
HCT: 30.9 % — ABNORMAL LOW (ref 36.0–46.0)
Hemoglobin: 10.4 g/dL — ABNORMAL LOW (ref 12.0–15.0)
Hemoglobin: 7.4 g/dL — ABNORMAL LOW (ref 12.0–15.0)
Hemoglobin: 7.7 g/dL — ABNORMAL LOW (ref 12.0–15.0)
MCH: 30.7 pg (ref 26.0–34.0)
MCH: 31.6 pg (ref 26.0–34.0)
MCHC: 32.4 g/dL (ref 30.0–36.0)
MCHC: 33.2 g/dL (ref 30.0–36.0)
MCHC: 33.7 g/dL (ref 30.0–36.0)
MCV: 95.3 fL (ref 78.0–100.0)
Platelets: 157 10*3/uL (ref 150–400)
RBC: 2.34 MIL/uL — ABNORMAL LOW (ref 3.87–5.11)
RDW: 17.5 % — ABNORMAL HIGH (ref 11.5–15.5)
WBC: 6.8 10*3/uL (ref 4.0–10.5)

## 2010-03-15 LAB — GLUCOSE, CAPILLARY
Glucose-Capillary: 101 mg/dL — ABNORMAL HIGH (ref 70–99)
Glucose-Capillary: 114 mg/dL — ABNORMAL HIGH (ref 70–99)
Glucose-Capillary: 83 mg/dL (ref 70–99)
Glucose-Capillary: 85 mg/dL (ref 70–99)
Glucose-Capillary: 85 mg/dL (ref 70–99)
Glucose-Capillary: 88 mg/dL (ref 70–99)
Glucose-Capillary: 89 mg/dL (ref 70–99)
Glucose-Capillary: 90 mg/dL (ref 70–99)
Glucose-Capillary: 90 mg/dL (ref 70–99)
Glucose-Capillary: 91 mg/dL (ref 70–99)
Glucose-Capillary: 93 mg/dL (ref 70–99)
Glucose-Capillary: 95 mg/dL (ref 70–99)

## 2010-03-15 LAB — PROTIME-INR
INR: 1.03 (ref 0.00–1.49)
Prothrombin Time: 13.7 seconds (ref 11.6–15.2)

## 2010-03-15 LAB — COMPREHENSIVE METABOLIC PANEL
AST: 24 U/L (ref 0–37)
BUN: 30 mg/dL — ABNORMAL HIGH (ref 6–23)
CO2: 24 mEq/L (ref 19–32)
Calcium: 9.2 mg/dL (ref 8.4–10.5)
Chloride: 100 mEq/L (ref 96–112)
Creatinine, Ser: 1.35 mg/dL — ABNORMAL HIGH (ref 0.4–1.2)
GFR calc Af Amer: 49 mL/min — ABNORMAL LOW (ref >60)
GFR calc non Af Amer: 41 mL/min — ABNORMAL LOW (ref >60)
Glucose, Bld: 84 mg/dL (ref 70–99)
Total Bilirubin: 0.4 mg/dL (ref 0.3–1.2)

## 2010-03-16 LAB — GLUCOSE, CAPILLARY
Glucose-Capillary: 101 mg/dL — ABNORMAL HIGH (ref 70–99)
Glucose-Capillary: 103 mg/dL — ABNORMAL HIGH (ref 70–99)
Glucose-Capillary: 108 mg/dL — ABNORMAL HIGH (ref 70–99)
Glucose-Capillary: 108 mg/dL — ABNORMAL HIGH (ref 70–99)
Glucose-Capillary: 110 mg/dL — ABNORMAL HIGH (ref 70–99)
Glucose-Capillary: 112 mg/dL — ABNORMAL HIGH (ref 70–99)
Glucose-Capillary: 113 mg/dL — ABNORMAL HIGH (ref 70–99)
Glucose-Capillary: 113 mg/dL — ABNORMAL HIGH (ref 70–99)
Glucose-Capillary: 116 mg/dL — ABNORMAL HIGH (ref 70–99)
Glucose-Capillary: 116 mg/dL — ABNORMAL HIGH (ref 70–99)
Glucose-Capillary: 117 mg/dL — ABNORMAL HIGH (ref 70–99)
Glucose-Capillary: 118 mg/dL — ABNORMAL HIGH (ref 70–99)
Glucose-Capillary: 122 mg/dL — ABNORMAL HIGH (ref 70–99)
Glucose-Capillary: 123 mg/dL — ABNORMAL HIGH (ref 70–99)
Glucose-Capillary: 124 mg/dL — ABNORMAL HIGH (ref 70–99)
Glucose-Capillary: 128 mg/dL — ABNORMAL HIGH (ref 70–99)
Glucose-Capillary: 128 mg/dL — ABNORMAL HIGH (ref 70–99)
Glucose-Capillary: 129 mg/dL — ABNORMAL HIGH (ref 70–99)
Glucose-Capillary: 130 mg/dL — ABNORMAL HIGH (ref 70–99)
Glucose-Capillary: 132 mg/dL — ABNORMAL HIGH (ref 70–99)
Glucose-Capillary: 132 mg/dL — ABNORMAL HIGH (ref 70–99)
Glucose-Capillary: 133 mg/dL — ABNORMAL HIGH (ref 70–99)
Glucose-Capillary: 134 mg/dL — ABNORMAL HIGH (ref 70–99)
Glucose-Capillary: 134 mg/dL — ABNORMAL HIGH (ref 70–99)
Glucose-Capillary: 136 mg/dL — ABNORMAL HIGH (ref 70–99)
Glucose-Capillary: 137 mg/dL — ABNORMAL HIGH (ref 70–99)
Glucose-Capillary: 137 mg/dL — ABNORMAL HIGH (ref 70–99)
Glucose-Capillary: 137 mg/dL — ABNORMAL HIGH (ref 70–99)
Glucose-Capillary: 138 mg/dL — ABNORMAL HIGH (ref 70–99)
Glucose-Capillary: 138 mg/dL — ABNORMAL HIGH (ref 70–99)
Glucose-Capillary: 139 mg/dL — ABNORMAL HIGH (ref 70–99)
Glucose-Capillary: 140 mg/dL — ABNORMAL HIGH (ref 70–99)
Glucose-Capillary: 141 mg/dL — ABNORMAL HIGH (ref 70–99)
Glucose-Capillary: 143 mg/dL — ABNORMAL HIGH (ref 70–99)
Glucose-Capillary: 143 mg/dL — ABNORMAL HIGH (ref 70–99)
Glucose-Capillary: 148 mg/dL — ABNORMAL HIGH (ref 70–99)
Glucose-Capillary: 148 mg/dL — ABNORMAL HIGH (ref 70–99)
Glucose-Capillary: 149 mg/dL — ABNORMAL HIGH (ref 70–99)
Glucose-Capillary: 150 mg/dL — ABNORMAL HIGH (ref 70–99)
Glucose-Capillary: 150 mg/dL — ABNORMAL HIGH (ref 70–99)
Glucose-Capillary: 152 mg/dL — ABNORMAL HIGH (ref 70–99)
Glucose-Capillary: 152 mg/dL — ABNORMAL HIGH (ref 70–99)
Glucose-Capillary: 154 mg/dL — ABNORMAL HIGH (ref 70–99)
Glucose-Capillary: 155 mg/dL — ABNORMAL HIGH (ref 70–99)
Glucose-Capillary: 155 mg/dL — ABNORMAL HIGH (ref 70–99)
Glucose-Capillary: 156 mg/dL — ABNORMAL HIGH (ref 70–99)
Glucose-Capillary: 156 mg/dL — ABNORMAL HIGH (ref 70–99)
Glucose-Capillary: 160 mg/dL — ABNORMAL HIGH (ref 70–99)
Glucose-Capillary: 161 mg/dL — ABNORMAL HIGH (ref 70–99)
Glucose-Capillary: 162 mg/dL — ABNORMAL HIGH (ref 70–99)
Glucose-Capillary: 162 mg/dL — ABNORMAL HIGH (ref 70–99)
Glucose-Capillary: 163 mg/dL — ABNORMAL HIGH (ref 70–99)
Glucose-Capillary: 165 mg/dL — ABNORMAL HIGH (ref 70–99)
Glucose-Capillary: 165 mg/dL — ABNORMAL HIGH (ref 70–99)
Glucose-Capillary: 168 mg/dL — ABNORMAL HIGH (ref 70–99)
Glucose-Capillary: 170 mg/dL — ABNORMAL HIGH (ref 70–99)
Glucose-Capillary: 171 mg/dL — ABNORMAL HIGH (ref 70–99)
Glucose-Capillary: 171 mg/dL — ABNORMAL HIGH (ref 70–99)
Glucose-Capillary: 173 mg/dL — ABNORMAL HIGH (ref 70–99)
Glucose-Capillary: 173 mg/dL — ABNORMAL HIGH (ref 70–99)
Glucose-Capillary: 175 mg/dL — ABNORMAL HIGH (ref 70–99)
Glucose-Capillary: 176 mg/dL — ABNORMAL HIGH (ref 70–99)
Glucose-Capillary: 181 mg/dL — ABNORMAL HIGH (ref 70–99)
Glucose-Capillary: 183 mg/dL — ABNORMAL HIGH (ref 70–99)
Glucose-Capillary: 185 mg/dL — ABNORMAL HIGH (ref 70–99)
Glucose-Capillary: 185 mg/dL — ABNORMAL HIGH (ref 70–99)
Glucose-Capillary: 186 mg/dL — ABNORMAL HIGH (ref 70–99)
Glucose-Capillary: 189 mg/dL — ABNORMAL HIGH (ref 70–99)
Glucose-Capillary: 203 mg/dL — ABNORMAL HIGH (ref 70–99)
Glucose-Capillary: 205 mg/dL — ABNORMAL HIGH (ref 70–99)
Glucose-Capillary: 205 mg/dL — ABNORMAL HIGH (ref 70–99)
Glucose-Capillary: 206 mg/dL — ABNORMAL HIGH (ref 70–99)
Glucose-Capillary: 208 mg/dL — ABNORMAL HIGH (ref 70–99)
Glucose-Capillary: 210 mg/dL — ABNORMAL HIGH (ref 70–99)
Glucose-Capillary: 216 mg/dL — ABNORMAL HIGH (ref 70–99)
Glucose-Capillary: 218 mg/dL — ABNORMAL HIGH (ref 70–99)
Glucose-Capillary: 225 mg/dL — ABNORMAL HIGH (ref 70–99)
Glucose-Capillary: 239 mg/dL — ABNORMAL HIGH (ref 70–99)
Glucose-Capillary: 67 mg/dL — ABNORMAL LOW (ref 70–99)
Glucose-Capillary: 71 mg/dL (ref 70–99)
Glucose-Capillary: 75 mg/dL (ref 70–99)
Glucose-Capillary: 78 mg/dL (ref 70–99)
Glucose-Capillary: 80 mg/dL (ref 70–99)
Glucose-Capillary: 86 mg/dL (ref 70–99)
Glucose-Capillary: 88 mg/dL (ref 70–99)
Glucose-Capillary: 90 mg/dL (ref 70–99)
Glucose-Capillary: 90 mg/dL (ref 70–99)
Glucose-Capillary: 92 mg/dL (ref 70–99)
Glucose-Capillary: 93 mg/dL (ref 70–99)
Glucose-Capillary: 96 mg/dL (ref 70–99)
Glucose-Capillary: 99 mg/dL (ref 70–99)

## 2010-03-16 LAB — CBC
HCT: 18.5 % — ABNORMAL LOW (ref 36.0–46.0)
HCT: 20.9 % — ABNORMAL LOW (ref 36.0–46.0)
HCT: 21.9 % — ABNORMAL LOW (ref 36.0–46.0)
HCT: 22.9 % — ABNORMAL LOW (ref 36.0–46.0)
HCT: 23.5 % — ABNORMAL LOW (ref 36.0–46.0)
HCT: 23.6 % — ABNORMAL LOW (ref 36.0–46.0)
HCT: 24 % — ABNORMAL LOW (ref 36.0–46.0)
HCT: 24.1 % — ABNORMAL LOW (ref 36.0–46.0)
HCT: 24.1 % — ABNORMAL LOW (ref 36.0–46.0)
HCT: 24.3 % — ABNORMAL LOW (ref 36.0–46.0)
HCT: 24.3 % — ABNORMAL LOW (ref 36.0–46.0)
HCT: 24.4 % — ABNORMAL LOW (ref 36.0–46.0)
HCT: 25 % — ABNORMAL LOW (ref 36.0–46.0)
HCT: 26.8 % — ABNORMAL LOW (ref 36.0–46.0)
Hemoglobin: 6.4 g/dL — CL (ref 12.0–15.0)
Hemoglobin: 7 g/dL — ABNORMAL LOW (ref 12.0–15.0)
Hemoglobin: 7.2 g/dL — ABNORMAL LOW (ref 12.0–15.0)
Hemoglobin: 7.3 g/dL — ABNORMAL LOW (ref 12.0–15.0)
Hemoglobin: 7.3 g/dL — ABNORMAL LOW (ref 12.0–15.0)
Hemoglobin: 7.4 g/dL — ABNORMAL LOW (ref 12.0–15.0)
Hemoglobin: 7.5 g/dL — ABNORMAL LOW (ref 12.0–15.0)
Hemoglobin: 7.5 g/dL — ABNORMAL LOW (ref 12.0–15.0)
Hemoglobin: 7.5 g/dL — ABNORMAL LOW (ref 12.0–15.0)
Hemoglobin: 7.6 g/dL — ABNORMAL LOW (ref 12.0–15.0)
Hemoglobin: 7.6 g/dL — ABNORMAL LOW (ref 12.0–15.0)
Hemoglobin: 7.7 g/dL — ABNORMAL LOW (ref 12.0–15.0)
Hemoglobin: 7.9 g/dL — ABNORMAL LOW (ref 12.0–15.0)
Hemoglobin: 8 g/dL — ABNORMAL LOW (ref 12.0–15.0)
Hemoglobin: 8.8 g/dL — ABNORMAL LOW (ref 12.0–15.0)
MCH: 29.1 pg (ref 26.0–34.0)
MCH: 29.2 pg (ref 26.0–34.0)
MCH: 29.4 pg (ref 26.0–34.0)
MCH: 29.6 pg (ref 26.0–34.0)
MCH: 29.6 pg (ref 26.0–34.0)
MCH: 29.6 pg (ref 26.0–34.0)
MCH: 29.9 pg (ref 26.0–34.0)
MCH: 30 pg (ref 26.0–34.0)
MCH: 30 pg (ref 26.0–34.0)
MCH: 30 pg (ref 26.0–34.0)
MCH: 30.2 pg (ref 26.0–34.0)
MCH: 30.3 pg (ref 26.0–34.0)
MCH: 30.4 pg (ref 26.0–34.0)
MCH: 30.5 pg (ref 26.0–34.0)
MCH: 30.6 pg (ref 26.0–34.0)
MCH: 30.6 pg (ref 26.0–34.0)
MCH: 31.1 pg (ref 26.0–34.0)
MCHC: 30.7 g/dL (ref 30.0–36.0)
MCHC: 30.8 g/dL (ref 30.0–36.0)
MCHC: 30.9 g/dL (ref 30.0–36.0)
MCHC: 31 g/dL (ref 30.0–36.0)
MCHC: 31 g/dL (ref 30.0–36.0)
MCHC: 31.1 g/dL (ref 30.0–36.0)
MCHC: 31.3 g/dL (ref 30.0–36.0)
MCHC: 31.4 g/dL (ref 30.0–36.0)
MCHC: 31.7 g/dL (ref 30.0–36.0)
MCHC: 31.9 g/dL (ref 30.0–36.0)
MCHC: 31.9 g/dL (ref 30.0–36.0)
MCHC: 32 g/dL (ref 30.0–36.0)
MCHC: 32 g/dL (ref 30.0–36.0)
MCHC: 32.1 g/dL (ref 30.0–36.0)
MCHC: 32.4 g/dL (ref 30.0–36.0)
MCHC: 32.5 g/dL (ref 30.0–36.0)
MCHC: 32.6 g/dL (ref 30.0–36.0)
MCHC: 32.8 g/dL (ref 30.0–36.0)
MCHC: 33.6 g/dL (ref 30.0–36.0)
MCHC: 35 g/dL (ref 30.0–36.0)
MCV: 85.7 fL (ref 78.0–100.0)
MCV: 91.4 fL (ref 78.0–100.0)
MCV: 92.6 fL (ref 78.0–100.0)
MCV: 93.1 fL (ref 78.0–100.0)
MCV: 93.1 fL (ref 78.0–100.0)
MCV: 93.7 fL (ref 78.0–100.0)
MCV: 94.2 fL (ref 78.0–100.0)
MCV: 94.8 fL (ref 78.0–100.0)
MCV: 94.9 fL (ref 78.0–100.0)
MCV: 94.9 fL (ref 78.0–100.0)
MCV: 95 fL (ref 78.0–100.0)
MCV: 95.9 fL (ref 78.0–100.0)
MCV: 95.9 fL (ref 78.0–100.0)
MCV: 96 fL (ref 78.0–100.0)
MCV: 96.4 fL (ref 78.0–100.0)
MCV: 98.7 fL (ref 78.0–100.0)
MCV: 99.2 fL (ref 78.0–100.0)
Platelets: 167 10*3/uL (ref 150–400)
Platelets: 177 10*3/uL (ref 150–400)
Platelets: 18 10*3/uL — CL (ref 150–400)
Platelets: 18 10*3/uL — CL (ref 150–400)
Platelets: 18 10*3/uL — CL (ref 150–400)
Platelets: 188 10*3/uL (ref 150–400)
Platelets: 196 10*3/uL (ref 150–400)
Platelets: 196 10*3/uL (ref 150–400)
Platelets: 198 10*3/uL (ref 150–400)
Platelets: 210 10*3/uL (ref 150–400)
Platelets: 212 10*3/uL (ref 150–400)
Platelets: 22 10*3/uL — CL (ref 150–400)
Platelets: 223 10*3/uL (ref 150–400)
Platelets: 239 10*3/uL (ref 150–400)
Platelets: 24 10*3/uL — CL (ref 150–400)
Platelets: 27 10*3/uL — CL (ref 150–400)
Platelets: 45 10*3/uL — ABNORMAL LOW (ref 150–400)
Platelets: 67 10*3/uL — ABNORMAL LOW (ref 150–400)
RBC: 2.18 MIL/uL — ABNORMAL LOW (ref 3.87–5.11)
RBC: 2.38 MIL/uL — ABNORMAL LOW (ref 3.87–5.11)
RBC: 2.43 MIL/uL — ABNORMAL LOW (ref 3.87–5.11)
RBC: 2.48 MIL/uL — ABNORMAL LOW (ref 3.87–5.11)
RBC: 2.48 MIL/uL — ABNORMAL LOW (ref 3.87–5.11)
RBC: 2.49 MIL/uL — ABNORMAL LOW (ref 3.87–5.11)
RBC: 2.51 MIL/uL — ABNORMAL LOW (ref 3.87–5.11)
RBC: 2.52 MIL/uL — ABNORMAL LOW (ref 3.87–5.11)
RBC: 2.56 MIL/uL — ABNORMAL LOW (ref 3.87–5.11)
RBC: 2.58 MIL/uL — ABNORMAL LOW (ref 3.87–5.11)
RBC: 2.61 MIL/uL — ABNORMAL LOW (ref 3.87–5.11)
RBC: 2.69 MIL/uL — ABNORMAL LOW (ref 3.87–5.11)
RBC: 2.71 MIL/uL — ABNORMAL LOW (ref 3.87–5.11)
RBC: 2.74 MIL/uL — ABNORMAL LOW (ref 3.87–5.11)
RBC: 2.87 MIL/uL — ABNORMAL LOW (ref 3.87–5.11)
RBC: 2.91 MIL/uL — ABNORMAL LOW (ref 3.87–5.11)
RBC: 2.97 MIL/uL — ABNORMAL LOW (ref 3.87–5.11)
RDW: 15.4 % (ref 11.5–15.5)
RDW: 15.5 % (ref 11.5–15.5)
RDW: 15.7 % — ABNORMAL HIGH (ref 11.5–15.5)
RDW: 15.9 % — ABNORMAL HIGH (ref 11.5–15.5)
RDW: 16.1 % — ABNORMAL HIGH (ref 11.5–15.5)
RDW: 16.5 % — ABNORMAL HIGH (ref 11.5–15.5)
RDW: 16.9 % — ABNORMAL HIGH (ref 11.5–15.5)
RDW: 17 % — ABNORMAL HIGH (ref 11.5–15.5)
RDW: 17.5 % — ABNORMAL HIGH (ref 11.5–15.5)
RDW: 17.5 % — ABNORMAL HIGH (ref 11.5–15.5)
RDW: 17.5 % — ABNORMAL HIGH (ref 11.5–15.5)
RDW: 17.6 % — ABNORMAL HIGH (ref 11.5–15.5)
RDW: 18.7 % — ABNORMAL HIGH (ref 11.5–15.5)
RDW: 18.9 % — ABNORMAL HIGH (ref 11.5–15.5)
RDW: 19.1 % — ABNORMAL HIGH (ref 11.5–15.5)
RDW: 19.1 % — ABNORMAL HIGH (ref 11.5–15.5)
RDW: 19.3 % — ABNORMAL HIGH (ref 11.5–15.5)
RDW: 19.5 % — ABNORMAL HIGH (ref 11.5–15.5)
WBC: 10.1 10*3/uL (ref 4.0–10.5)
WBC: 11.8 10*3/uL — ABNORMAL HIGH (ref 4.0–10.5)
WBC: 12.4 10*3/uL — ABNORMAL HIGH (ref 4.0–10.5)
WBC: 12.8 10*3/uL — ABNORMAL HIGH (ref 4.0–10.5)
WBC: 14.2 10*3/uL — ABNORMAL HIGH (ref 4.0–10.5)
WBC: 16.6 10*3/uL — ABNORMAL HIGH (ref 4.0–10.5)
WBC: 17 10*3/uL — ABNORMAL HIGH (ref 4.0–10.5)
WBC: 17.6 10*3/uL — ABNORMAL HIGH (ref 4.0–10.5)
WBC: 17.7 10*3/uL — ABNORMAL HIGH (ref 4.0–10.5)
WBC: 18.8 10*3/uL — ABNORMAL HIGH (ref 4.0–10.5)
WBC: 19.8 10*3/uL — ABNORMAL HIGH (ref 4.0–10.5)
WBC: 24 10*3/uL — ABNORMAL HIGH (ref 4.0–10.5)

## 2010-03-16 LAB — DIFFERENTIAL
Basophils Absolute: 0 10*3/uL (ref 0.0–0.1)
Basophils Absolute: 0 10*3/uL (ref 0.0–0.1)
Basophils Absolute: 0 10*3/uL (ref 0.0–0.1)
Basophils Absolute: 0 10*3/uL (ref 0.0–0.1)
Basophils Absolute: 0 10*3/uL (ref 0.0–0.1)
Basophils Absolute: 0 10*3/uL (ref 0.0–0.1)
Basophils Absolute: 0 10*3/uL (ref 0.0–0.1)
Basophils Absolute: 0 10*3/uL (ref 0.0–0.1)
Basophils Absolute: 0 10*3/uL (ref 0.0–0.1)
Basophils Absolute: 0 10*3/uL (ref 0.0–0.1)
Basophils Absolute: 0 10*3/uL (ref 0.0–0.1)
Basophils Absolute: 0 10*3/uL (ref 0.0–0.1)
Basophils Absolute: 0.1 10*3/uL (ref 0.0–0.1)
Basophils Absolute: 0.1 10*3/uL (ref 0.0–0.1)
Basophils Absolute: 0.1 10*3/uL (ref 0.0–0.1)
Basophils Absolute: 0.1 10*3/uL (ref 0.0–0.1)
Basophils Absolute: 0.1 10*3/uL (ref 0.0–0.1)
Basophils Relative: 0 % (ref 0–1)
Basophils Relative: 0 % (ref 0–1)
Basophils Relative: 0 % (ref 0–1)
Basophils Relative: 0 % (ref 0–1)
Basophils Relative: 0 % (ref 0–1)
Basophils Relative: 0 % (ref 0–1)
Basophils Relative: 0 % (ref 0–1)
Basophils Relative: 0 % (ref 0–1)
Basophils Relative: 0 % (ref 0–1)
Basophils Relative: 0 % (ref 0–1)
Basophils Relative: 0 % (ref 0–1)
Basophils Relative: 0 % (ref 0–1)
Basophils Relative: 1 % (ref 0–1)
Basophils Relative: 1 % (ref 0–1)
Eosinophils Absolute: 0 10*3/uL (ref 0.0–0.7)
Eosinophils Absolute: 0 10*3/uL (ref 0.0–0.7)
Eosinophils Absolute: 0 10*3/uL (ref 0.0–0.7)
Eosinophils Absolute: 0 10*3/uL (ref 0.0–0.7)
Eosinophils Absolute: 0 10*3/uL (ref 0.0–0.7)
Eosinophils Absolute: 0 10*3/uL (ref 0.0–0.7)
Eosinophils Absolute: 0 10*3/uL (ref 0.0–0.7)
Eosinophils Absolute: 0 10*3/uL (ref 0.0–0.7)
Eosinophils Absolute: 0 10*3/uL (ref 0.0–0.7)
Eosinophils Absolute: 0 10*3/uL (ref 0.0–0.7)
Eosinophils Absolute: 0.2 10*3/uL (ref 0.0–0.7)
Eosinophils Absolute: 0.4 10*3/uL (ref 0.0–0.7)
Eosinophils Absolute: 0.4 10*3/uL (ref 0.0–0.7)
Eosinophils Absolute: 0.4 10*3/uL (ref 0.0–0.7)
Eosinophils Absolute: 0.5 10*3/uL (ref 0.0–0.7)
Eosinophils Relative: 0 % (ref 0–5)
Eosinophils Relative: 0 % (ref 0–5)
Eosinophils Relative: 0 % (ref 0–5)
Eosinophils Relative: 0 % (ref 0–5)
Eosinophils Relative: 1 % (ref 0–5)
Eosinophils Relative: 1 % (ref 0–5)
Eosinophils Relative: 2 % (ref 0–5)
Eosinophils Relative: 3 % (ref 0–5)
Eosinophils Relative: 4 % (ref 0–5)
Eosinophils Relative: 5 % (ref 0–5)
Lymphocytes Relative: 1 % — ABNORMAL LOW (ref 12–46)
Lymphocytes Relative: 2 % — ABNORMAL LOW (ref 12–46)
Lymphocytes Relative: 3 % — ABNORMAL LOW (ref 12–46)
Lymphocytes Relative: 4 % — ABNORMAL LOW (ref 12–46)
Lymphocytes Relative: 5 % — ABNORMAL LOW (ref 12–46)
Lymphocytes Relative: 6 % — ABNORMAL LOW (ref 12–46)
Lymphocytes Relative: 6 % — ABNORMAL LOW (ref 12–46)
Lymphocytes Relative: 6 % — ABNORMAL LOW (ref 12–46)
Lymphocytes Relative: 6 % — ABNORMAL LOW (ref 12–46)
Lymphocytes Relative: 6 % — ABNORMAL LOW (ref 12–46)
Lymphocytes Relative: 6 % — ABNORMAL LOW (ref 12–46)
Lymphocytes Relative: 6 % — ABNORMAL LOW (ref 12–46)
Lymphocytes Relative: 6 % — ABNORMAL LOW (ref 12–46)
Lymphocytes Relative: 8 % — ABNORMAL LOW (ref 12–46)
Lymphocytes Relative: 8 % — ABNORMAL LOW (ref 12–46)
Lymphocytes Relative: 9 % — ABNORMAL LOW (ref 12–46)
Lymphocytes Relative: 9 % — ABNORMAL LOW (ref 12–46)
Lymphs Abs: 0.1 10*3/uL — ABNORMAL LOW (ref 0.7–4.0)
Lymphs Abs: 0.1 10*3/uL — ABNORMAL LOW (ref 0.7–4.0)
Lymphs Abs: 0.2 10*3/uL — ABNORMAL LOW (ref 0.7–4.0)
Lymphs Abs: 0.2 10*3/uL — ABNORMAL LOW (ref 0.7–4.0)
Lymphs Abs: 0.3 10*3/uL — ABNORMAL LOW (ref 0.7–4.0)
Lymphs Abs: 0.4 10*3/uL — ABNORMAL LOW (ref 0.7–4.0)
Lymphs Abs: 0.6 10*3/uL — ABNORMAL LOW (ref 0.7–4.0)
Lymphs Abs: 0.7 10*3/uL (ref 0.7–4.0)
Lymphs Abs: 0.9 10*3/uL (ref 0.7–4.0)
Lymphs Abs: 0.9 10*3/uL (ref 0.7–4.0)
Lymphs Abs: 1 10*3/uL (ref 0.7–4.0)
Lymphs Abs: 1 10*3/uL (ref 0.7–4.0)
Lymphs Abs: 1.1 10*3/uL (ref 0.7–4.0)
Lymphs Abs: 1.1 10*3/uL (ref 0.7–4.0)
Lymphs Abs: 1.1 10*3/uL (ref 0.7–4.0)
Lymphs Abs: 1.4 10*3/uL (ref 0.7–4.0)
Lymphs Abs: 1.5 10*3/uL (ref 0.7–4.0)
Monocytes Absolute: 0.1 10*3/uL (ref 0.1–1.0)
Monocytes Absolute: 0.2 10*3/uL (ref 0.1–1.0)
Monocytes Absolute: 0.7 10*3/uL (ref 0.1–1.0)
Monocytes Absolute: 0.7 10*3/uL (ref 0.1–1.0)
Monocytes Absolute: 0.8 10*3/uL (ref 0.1–1.0)
Monocytes Absolute: 0.9 10*3/uL (ref 0.1–1.0)
Monocytes Absolute: 0.9 10*3/uL (ref 0.1–1.0)
Monocytes Absolute: 0.9 10*3/uL (ref 0.1–1.0)
Monocytes Absolute: 1 10*3/uL (ref 0.1–1.0)
Monocytes Absolute: 1 10*3/uL (ref 0.1–1.0)
Monocytes Absolute: 1.1 10*3/uL — ABNORMAL HIGH (ref 0.1–1.0)
Monocytes Absolute: 1.2 10*3/uL — ABNORMAL HIGH (ref 0.1–1.0)
Monocytes Absolute: 1.2 10*3/uL — ABNORMAL HIGH (ref 0.1–1.0)
Monocytes Absolute: 1.3 10*3/uL — ABNORMAL HIGH (ref 0.1–1.0)
Monocytes Absolute: 1.4 10*3/uL — ABNORMAL HIGH (ref 0.1–1.0)
Monocytes Relative: 1 % — ABNORMAL LOW (ref 3–12)
Monocytes Relative: 10 % (ref 3–12)
Monocytes Relative: 10 % (ref 3–12)
Monocytes Relative: 10 % (ref 3–12)
Monocytes Relative: 5 % (ref 3–12)
Monocytes Relative: 6 % (ref 3–12)
Monocytes Relative: 6 % (ref 3–12)
Monocytes Relative: 6 % (ref 3–12)
Monocytes Relative: 6 % (ref 3–12)
Monocytes Relative: 7 % (ref 3–12)
Monocytes Relative: 8 % (ref 3–12)
Monocytes Relative: 8 % (ref 3–12)
Monocytes Relative: 8 % (ref 3–12)
Neutro Abs: 1.5 10*3/uL — ABNORMAL LOW (ref 1.7–7.7)
Neutro Abs: 10.2 10*3/uL — ABNORMAL HIGH (ref 1.7–7.7)
Neutro Abs: 10.6 10*3/uL — ABNORMAL HIGH (ref 1.7–7.7)
Neutro Abs: 10.9 10*3/uL — ABNORMAL HIGH (ref 1.7–7.7)
Neutro Abs: 12 10*3/uL — ABNORMAL HIGH (ref 1.7–7.7)
Neutro Abs: 12.3 10*3/uL — ABNORMAL HIGH (ref 1.7–7.7)
Neutro Abs: 12.6 10*3/uL — ABNORMAL HIGH (ref 1.7–7.7)
Neutro Abs: 12.8 10*3/uL — ABNORMAL HIGH (ref 1.7–7.7)
Neutro Abs: 13 10*3/uL — ABNORMAL HIGH (ref 1.7–7.7)
Neutro Abs: 14.2 10*3/uL — ABNORMAL HIGH (ref 1.7–7.7)
Neutro Abs: 15.4 10*3/uL — ABNORMAL HIGH (ref 1.7–7.7)
Neutro Abs: 16.2 10*3/uL — ABNORMAL HIGH (ref 1.7–7.7)
Neutro Abs: 16.4 10*3/uL — ABNORMAL HIGH (ref 1.7–7.7)
Neutro Abs: 17 10*3/uL — ABNORMAL HIGH (ref 1.7–7.7)
Neutro Abs: 28.3 10*3/uL — ABNORMAL HIGH (ref 1.7–7.7)
Neutro Abs: 9.1 10*3/uL — ABNORMAL HIGH (ref 1.7–7.7)
Neutrophils Relative %: 78 % — ABNORMAL HIGH (ref 43–77)
Neutrophils Relative %: 80 % — ABNORMAL HIGH (ref 43–77)
Neutrophils Relative %: 82 % — ABNORMAL HIGH (ref 43–77)
Neutrophils Relative %: 83 % — ABNORMAL HIGH (ref 43–77)
Neutrophils Relative %: 84 % — ABNORMAL HIGH (ref 43–77)
Neutrophils Relative %: 84 % — ABNORMAL HIGH (ref 43–77)
Neutrophils Relative %: 85 % — ABNORMAL HIGH (ref 43–77)
Neutrophils Relative %: 86 % — ABNORMAL HIGH (ref 43–77)
Neutrophils Relative %: 86 % — ABNORMAL HIGH (ref 43–77)
Neutrophils Relative %: 87 % — ABNORMAL HIGH (ref 43–77)
Neutrophils Relative %: 89 % — ABNORMAL HIGH (ref 43–77)
Neutrophils Relative %: 93 % — ABNORMAL HIGH (ref 43–77)
Neutrophils Relative %: 94 % — ABNORMAL HIGH (ref 43–77)
Neutrophils Relative %: 97 % — ABNORMAL HIGH (ref 43–77)
Smear Review: ADEQUATE
WBC Morphology: INCREASED
WBC Morphology: INCREASED

## 2010-03-16 LAB — PHOSPHORUS
Phosphorus: 2 mg/dL — ABNORMAL LOW (ref 2.3–4.6)
Phosphorus: 2.2 mg/dL — ABNORMAL LOW (ref 2.3–4.6)
Phosphorus: 4.2 mg/dL (ref 2.3–4.6)
Phosphorus: 4.6 mg/dL (ref 2.3–4.6)
Phosphorus: 5.3 mg/dL — ABNORMAL HIGH (ref 2.3–4.6)
Phosphorus: 7.2 mg/dL — ABNORMAL HIGH (ref 2.3–4.6)

## 2010-03-16 LAB — POCT I-STAT 3, ART BLOOD GAS (G3+)
Acid-Base Excess: 12 mmol/L — ABNORMAL HIGH (ref 0.0–2.0)
Acid-Base Excess: 14 mmol/L — ABNORMAL HIGH (ref 0.0–2.0)
Acid-Base Excess: 18 mmol/L — ABNORMAL HIGH (ref 0.0–2.0)
Acid-Base Excess: 6 mmol/L — ABNORMAL HIGH (ref 0.0–2.0)
Bicarbonate: 36.7 mEq/L — ABNORMAL HIGH (ref 20.0–24.0)
Bicarbonate: 38 mEq/L — ABNORMAL HIGH (ref 20.0–24.0)
Bicarbonate: 38.5 mEq/L — ABNORMAL HIGH (ref 20.0–24.0)
Bicarbonate: 39.1 mEq/L — ABNORMAL HIGH (ref 20.0–24.0)
Bicarbonate: 39.1 mEq/L — ABNORMAL HIGH (ref 20.0–24.0)
O2 Saturation: 89 %
O2 Saturation: 94 %
Patient temperature: 37.7
Patient temperature: 37.9
Patient temperature: 38.2
Patient temperature: 38.8
Patient temperature: 98.7
TCO2: 32 mmol/L (ref 0–100)
TCO2: 38 mmol/L (ref 0–100)
TCO2: 40 mmol/L (ref 0–100)
TCO2: 40 mmol/L (ref 0–100)
TCO2: 41 mmol/L (ref 0–100)
pCO2 arterial: 34.2 mmHg — ABNORMAL LOW (ref 35.0–45.0)
pCO2 arterial: 49 mmHg — ABNORMAL HIGH (ref 35.0–45.0)
pH, Arterial: 7.436 — ABNORMAL HIGH (ref 7.350–7.400)
pH, Arterial: 7.508 — ABNORMAL HIGH (ref 7.350–7.400)
pH, Arterial: 7.573 — ABNORMAL HIGH (ref 7.350–7.400)
pO2, Arterial: 56 mmHg — ABNORMAL LOW (ref 80.0–100.0)
pO2, Arterial: 69 mmHg — ABNORMAL LOW (ref 80.0–100.0)
pO2, Arterial: 88 mmHg (ref 80.0–100.0)

## 2010-03-16 LAB — BASIC METABOLIC PANEL
BUN: 25 mg/dL — ABNORMAL HIGH (ref 6–23)
BUN: 33 mg/dL — ABNORMAL HIGH (ref 6–23)
BUN: 35 mg/dL — ABNORMAL HIGH (ref 6–23)
BUN: 37 mg/dL — ABNORMAL HIGH (ref 6–23)
BUN: 37 mg/dL — ABNORMAL HIGH (ref 6–23)
BUN: 42 mg/dL — ABNORMAL HIGH (ref 6–23)
BUN: 56 mg/dL — ABNORMAL HIGH (ref 6–23)
BUN: 57 mg/dL — ABNORMAL HIGH (ref 6–23)
BUN: 60 mg/dL — ABNORMAL HIGH (ref 6–23)
BUN: 60 mg/dL — ABNORMAL HIGH (ref 6–23)
BUN: 61 mg/dL — ABNORMAL HIGH (ref 6–23)
BUN: 62 mg/dL — ABNORMAL HIGH (ref 6–23)
BUN: 62 mg/dL — ABNORMAL HIGH (ref 6–23)
BUN: 64 mg/dL — ABNORMAL HIGH (ref 6–23)
BUN: 64 mg/dL — ABNORMAL HIGH (ref 6–23)
BUN: 65 mg/dL — ABNORMAL HIGH (ref 6–23)
CO2: 27 mEq/L (ref 19–32)
CO2: 29 mEq/L (ref 19–32)
CO2: 29 mEq/L (ref 19–32)
CO2: 29 mEq/L (ref 19–32)
CO2: 30 mEq/L (ref 19–32)
CO2: 30 mEq/L (ref 19–32)
CO2: 36 mEq/L — ABNORMAL HIGH (ref 19–32)
CO2: 37 mEq/L — ABNORMAL HIGH (ref 19–32)
CO2: 37 mEq/L — ABNORMAL HIGH (ref 19–32)
CO2: 40 mEq/L — ABNORMAL HIGH (ref 19–32)
CO2: 40 mEq/L — ABNORMAL HIGH (ref 19–32)
Calcium: 7.5 mg/dL — ABNORMAL LOW (ref 8.4–10.5)
Calcium: 7.6 mg/dL — ABNORMAL LOW (ref 8.4–10.5)
Calcium: 8.2 mg/dL — ABNORMAL LOW (ref 8.4–10.5)
Calcium: 8.4 mg/dL (ref 8.4–10.5)
Calcium: 8.9 mg/dL (ref 8.4–10.5)
Calcium: 8.9 mg/dL (ref 8.4–10.5)
Calcium: 9 mg/dL (ref 8.4–10.5)
Calcium: 9.1 mg/dL (ref 8.4–10.5)
Calcium: 9.4 mg/dL (ref 8.4–10.5)
Chloride: 101 mEq/L (ref 96–112)
Chloride: 102 mEq/L (ref 96–112)
Chloride: 103 mEq/L (ref 96–112)
Chloride: 104 mEq/L (ref 96–112)
Chloride: 105 mEq/L (ref 96–112)
Chloride: 106 mEq/L (ref 96–112)
Chloride: 106 mEq/L (ref 96–112)
Chloride: 107 mEq/L (ref 96–112)
Chloride: 108 mEq/L (ref 96–112)
Chloride: 93 mEq/L — ABNORMAL LOW (ref 96–112)
Chloride: 96 mEq/L (ref 96–112)
Creatinine, Ser: 1.2 mg/dL (ref 0.4–1.2)
Creatinine, Ser: 1.27 mg/dL — ABNORMAL HIGH (ref 0.4–1.2)
Creatinine, Ser: 1.35 mg/dL — ABNORMAL HIGH (ref 0.4–1.2)
Creatinine, Ser: 1.43 mg/dL — ABNORMAL HIGH (ref 0.4–1.2)
Creatinine, Ser: 1.5 mg/dL — ABNORMAL HIGH (ref 0.4–1.2)
Creatinine, Ser: 1.53 mg/dL — ABNORMAL HIGH (ref 0.4–1.2)
Creatinine, Ser: 1.63 mg/dL — ABNORMAL HIGH (ref 0.4–1.2)
Creatinine, Ser: 1.73 mg/dL — ABNORMAL HIGH (ref 0.4–1.2)
Creatinine, Ser: 2.03 mg/dL — ABNORMAL HIGH (ref 0.4–1.2)
Creatinine, Ser: 2.09 mg/dL — ABNORMAL HIGH (ref 0.4–1.2)
Creatinine, Ser: 2.13 mg/dL — ABNORMAL HIGH (ref 0.4–1.2)
Creatinine, Ser: 2.14 mg/dL — ABNORMAL HIGH (ref 0.4–1.2)
Creatinine, Ser: 2.15 mg/dL — ABNORMAL HIGH (ref 0.4–1.2)
Creatinine, Ser: 2.16 mg/dL — ABNORMAL HIGH (ref 0.4–1.2)
Creatinine, Ser: 2.49 mg/dL — ABNORMAL HIGH (ref 0.4–1.2)
GFR calc Af Amer: 28 mL/min — ABNORMAL LOW (ref >60)
GFR calc Af Amer: 30 mL/min — ABNORMAL LOW (ref >60)
GFR calc Af Amer: 37 mL/min — ABNORMAL LOW (ref >60)
GFR calc Af Amer: 40 mL/min — ABNORMAL LOW (ref >60)
GFR calc Af Amer: 44 mL/min — ABNORMAL LOW (ref >60)
GFR calc Af Amer: 46 mL/min — ABNORMAL LOW (ref >60)
GFR calc Af Amer: 53 mL/min — ABNORMAL LOW (ref >60)
GFR calc Af Amer: 53 mL/min — ABNORMAL LOW (ref >60)
GFR calc Af Amer: 60 mL/min — ABNORMAL LOW (ref >60)
GFR calc non Af Amer: 20 mL/min — ABNORMAL LOW (ref >60)
GFR calc non Af Amer: 24 mL/min — ABNORMAL LOW (ref >60)
GFR calc non Af Amer: 24 mL/min — ABNORMAL LOW (ref >60)
GFR calc non Af Amer: 24 mL/min — ABNORMAL LOW (ref >60)
GFR calc non Af Amer: 25 mL/min — ABNORMAL LOW (ref >60)
GFR calc non Af Amer: 26 mL/min — ABNORMAL LOW (ref >60)
GFR calc non Af Amer: 33 mL/min — ABNORMAL LOW (ref >60)
GFR calc non Af Amer: 34 mL/min — ABNORMAL LOW (ref >60)
GFR calc non Af Amer: 38 mL/min — ABNORMAL LOW (ref >60)
GFR calc non Af Amer: 41 mL/min — ABNORMAL LOW (ref >60)
GFR calc non Af Amer: 47 mL/min — ABNORMAL LOW (ref >60)
Glucose, Bld: 108 mg/dL — ABNORMAL HIGH (ref 70–99)
Glucose, Bld: 121 mg/dL — ABNORMAL HIGH (ref 70–99)
Glucose, Bld: 161 mg/dL — ABNORMAL HIGH (ref 70–99)
Glucose, Bld: 167 mg/dL — ABNORMAL HIGH (ref 70–99)
Glucose, Bld: 171 mg/dL — ABNORMAL HIGH (ref 70–99)
Glucose, Bld: 199 mg/dL — ABNORMAL HIGH (ref 70–99)
Glucose, Bld: 229 mg/dL — ABNORMAL HIGH (ref 70–99)
Glucose, Bld: 80 mg/dL (ref 70–99)
Glucose, Bld: 88 mg/dL (ref 70–99)
Glucose, Bld: 99 mg/dL (ref 70–99)
Potassium: 3.1 mEq/L — ABNORMAL LOW (ref 3.5–5.1)
Potassium: 3.7 mEq/L (ref 3.5–5.1)
Potassium: 3.8 mEq/L (ref 3.5–5.1)
Potassium: 4.3 mEq/L (ref 3.5–5.1)
Potassium: 4.3 mEq/L (ref 3.5–5.1)
Potassium: 4.8 mEq/L (ref 3.5–5.1)
Potassium: 4.9 mEq/L (ref 3.5–5.1)
Potassium: 5.2 mEq/L — ABNORMAL HIGH (ref 3.5–5.1)
Potassium: 5.3 mEq/L — ABNORMAL HIGH (ref 3.5–5.1)
Potassium: 5.6 mEq/L — ABNORMAL HIGH (ref 3.5–5.1)
Sodium: 142 mEq/L (ref 135–145)
Sodium: 143 mEq/L (ref 135–145)
Sodium: 144 mEq/L (ref 135–145)
Sodium: 145 mEq/L (ref 135–145)
Sodium: 146 mEq/L — ABNORMAL HIGH (ref 135–145)
Sodium: 146 mEq/L — ABNORMAL HIGH (ref 135–145)
Sodium: 147 mEq/L — ABNORMAL HIGH (ref 135–145)
Sodium: 147 mEq/L — ABNORMAL HIGH (ref 135–145)
Sodium: 147 mEq/L — ABNORMAL HIGH (ref 135–145)
Sodium: 148 mEq/L — ABNORMAL HIGH (ref 135–145)

## 2010-03-16 LAB — HERPES SIMPLEX VIRUS(HSV) DNA BY PCR: HSV 2 DNA: NOT DETECTED

## 2010-03-16 LAB — CULTURE, BAL-QUANTITATIVE W GRAM STAIN: Colony Count: >=100000

## 2010-03-16 LAB — BLOOD GAS, ARTERIAL
Drawn by: 15527
FIO2: 0.4 %
PEEP: 5 cmH2O
RATE: 12 resp/min
pCO2 arterial: 56.8 mmHg — ABNORMAL HIGH (ref 35.0–45.0)
pH, Arterial: 7.431 — ABNORMAL HIGH (ref 7.350–7.400)
pO2, Arterial: 79.1 mmHg — ABNORMAL LOW (ref 80.0–100.0)

## 2010-03-16 LAB — COMPREHENSIVE METABOLIC PANEL
ALT: 32 U/L (ref 0–35)
ALT: 33 U/L (ref 0–35)
ALT: 44 U/L — ABNORMAL HIGH (ref 0–35)
ALT: 70 U/L — ABNORMAL HIGH (ref 0–35)
ALT: 85 U/L — ABNORMAL HIGH (ref 0–35)
AST: 26 U/L (ref 0–37)
AST: 36 U/L (ref 0–37)
AST: 88 U/L — ABNORMAL HIGH (ref 0–37)
Albumin: 1.6 g/dL — ABNORMAL LOW (ref 3.5–5.2)
Albumin: 1.9 g/dL — ABNORMAL LOW (ref 3.5–5.2)
Albumin: 2.2 g/dL — ABNORMAL LOW (ref 3.5–5.2)
Alkaline Phosphatase: 105 U/L (ref 39–117)
Alkaline Phosphatase: 132 U/L — ABNORMAL HIGH (ref 39–117)
Alkaline Phosphatase: 133 U/L — ABNORMAL HIGH (ref 39–117)
BUN: 39 mg/dL — ABNORMAL HIGH (ref 6–23)
BUN: 48 mg/dL — ABNORMAL HIGH (ref 6–23)
BUN: 57 mg/dL — ABNORMAL HIGH (ref 6–23)
CO2: 32 mEq/L (ref 19–32)
CO2: 36 mEq/L — ABNORMAL HIGH (ref 19–32)
CO2: 38 mEq/L — ABNORMAL HIGH (ref 19–32)
CO2: 39 mEq/L — ABNORMAL HIGH (ref 19–32)
Calcium: 7.7 mg/dL — ABNORMAL LOW (ref 8.4–10.5)
Calcium: 8.1 mg/dL — ABNORMAL LOW (ref 8.4–10.5)
Calcium: 9.3 mg/dL (ref 8.4–10.5)
Chloride: 103 mEq/L (ref 96–112)
Chloride: 104 mEq/L (ref 96–112)
Chloride: 107 mEq/L (ref 96–112)
Chloride: 92 mEq/L — ABNORMAL LOW (ref 96–112)
Chloride: 96 mEq/L (ref 96–112)
Creatinine, Ser: 1.17 mg/dL (ref 0.4–1.2)
Creatinine, Ser: 1.61 mg/dL — ABNORMAL HIGH (ref 0.4–1.2)
Creatinine, Ser: 2.05 mg/dL — ABNORMAL HIGH (ref 0.4–1.2)
Creatinine, Ser: 2.1 mg/dL — ABNORMAL HIGH (ref 0.4–1.2)
Creatinine, Ser: 2.37 mg/dL — ABNORMAL HIGH (ref 0.4–1.2)
GFR calc Af Amer: 26 mL/min — ABNORMAL LOW (ref >60)
GFR calc Af Amer: 30 mL/min — ABNORMAL LOW (ref >60)
GFR calc Af Amer: 58 mL/min — ABNORMAL LOW (ref >60)
GFR calc non Af Amer: 32 mL/min — ABNORMAL LOW (ref >60)
GFR calc non Af Amer: 33 mL/min — ABNORMAL LOW (ref >60)
GFR calc non Af Amer: 44 mL/min — ABNORMAL LOW (ref >60)
Glucose, Bld: 113 mg/dL — ABNORMAL HIGH (ref 70–99)
Glucose, Bld: 148 mg/dL — ABNORMAL HIGH (ref 70–99)
Glucose, Bld: 219 mg/dL — ABNORMAL HIGH (ref 70–99)
Potassium: 3.4 mEq/L — ABNORMAL LOW (ref 3.5–5.1)
Potassium: 4 mEq/L (ref 3.5–5.1)
Potassium: 4.8 mEq/L (ref 3.5–5.1)
Potassium: 5.9 mEq/L — ABNORMAL HIGH (ref 3.5–5.1)
Sodium: 143 mEq/L (ref 135–145)
Sodium: 146 mEq/L — ABNORMAL HIGH (ref 135–145)
Sodium: 149 mEq/L — ABNORMAL HIGH (ref 135–145)
Sodium: 151 mEq/L — ABNORMAL HIGH (ref 135–145)
Sodium: 151 mEq/L — ABNORMAL HIGH (ref 135–145)
Total Bilirubin: 0.3 mg/dL (ref 0.3–1.2)
Total Bilirubin: 0.4 mg/dL (ref 0.3–1.2)
Total Bilirubin: 0.4 mg/dL (ref 0.3–1.2)
Total Bilirubin: 0.5 mg/dL (ref 0.3–1.2)
Total Bilirubin: 1.2 mg/dL (ref 0.3–1.2)
Total Protein: 5.2 g/dL — ABNORMAL LOW (ref 6.0–8.3)
Total Protein: 5.9 g/dL — ABNORMAL LOW (ref 6.0–8.3)

## 2010-03-16 LAB — CULTURE, RESPIRATORY W GRAM STAIN

## 2010-03-16 LAB — CSF CELL COUNT WITH DIFFERENTIAL
RBC Count, CSF: 66 /mm3 — ABNORMAL HIGH
Tube #: 4
WBC, CSF: 1 /mm3 (ref 0–5)

## 2010-03-16 LAB — ANAEROBIC CULTURE

## 2010-03-16 LAB — MISCELLANEOUS TEST

## 2010-03-16 LAB — SODIUM, URINE, RANDOM: Sodium, Ur: 103 mEq/L

## 2010-03-16 LAB — MAGNESIUM
Magnesium: 1.8 mg/dL (ref 1.5–2.5)
Magnesium: 1.9 mg/dL (ref 1.5–2.5)
Magnesium: 1.9 mg/dL (ref 1.5–2.5)
Magnesium: 2 mg/dL (ref 1.5–2.5)
Magnesium: 2 mg/dL (ref 1.5–2.5)
Magnesium: 2.1 mg/dL (ref 1.5–2.5)
Magnesium: 2.1 mg/dL (ref 1.5–2.5)
Magnesium: 2.3 mg/dL (ref 1.5–2.5)

## 2010-03-16 LAB — URINALYSIS, ROUTINE W REFLEX MICROSCOPIC
Bilirubin Urine: NEGATIVE
Hgb urine dipstick: NEGATIVE
Ketones, ur: NEGATIVE mg/dL
Leukocytes, UA: NEGATIVE
Nitrite: NEGATIVE
Nitrite: NEGATIVE
Nitrite: POSITIVE — AB
Protein, ur: 100 mg/dL — AB
Protein, ur: 30 mg/dL — AB
Specific Gravity, Urine: 1.019 (ref 1.005–1.030)
Urobilinogen, UA: 0.2 mg/dL (ref 0.0–1.0)
Urobilinogen, UA: 0.2 mg/dL (ref 0.0–1.0)
Urobilinogen, UA: 0.2 mg/dL (ref 0.0–1.0)

## 2010-03-16 LAB — AFB CULTURE WITH SMEAR (NOT AT ARMC)
Acid Fast Smear: NONE SEEN
Acid Fast Smear: NONE SEEN

## 2010-03-16 LAB — CROSSMATCH
Antibody Screen: NEGATIVE
Antibody Screen: NEGATIVE
Unit division: 0
Unit division: 0

## 2010-03-16 LAB — VANCOMYCIN, TROUGH
Vancomycin Tr: 14.3 ug/mL (ref 10.0–20.0)
Vancomycin Tr: <5 ug/mL — ABNORMAL LOW (ref 10.0–20.0)

## 2010-03-16 LAB — CLOSTRIDIUM DIFFICILE BY PCR: Toxigenic C. Difficile by PCR: NOT DETECTED

## 2010-03-16 LAB — FUNGUS CULTURE W SMEAR
Fungal Smear: NONE SEEN
Fungal Smear: NONE SEEN

## 2010-03-16 LAB — HEPATIC FUNCTION PANEL
AST: 137 U/L — ABNORMAL HIGH (ref 0–37)
AST: 73 U/L — ABNORMAL HIGH (ref 0–37)
Albumin: 1.6 g/dL — ABNORMAL LOW (ref 3.5–5.2)
Albumin: 1.7 g/dL — ABNORMAL LOW (ref 3.5–5.2)
Total Bilirubin: 1 mg/dL (ref 0.3–1.2)

## 2010-03-16 LAB — TOBRAMYCIN LEVEL, TROUGH
Tobramycin Tr: 1.6 ug/mL (ref 0.5–2.0)
Tobramycin Tr: 11.3 ug/mL (ref 0.5–2.0)

## 2010-03-16 LAB — URINE CULTURE: Colony Count: NO GROWTH

## 2010-03-16 LAB — CULTURE, BLOOD (ROUTINE X 2)
Culture: NO GROWTH
Culture: NO GROWTH
Culture: NO GROWTH

## 2010-03-16 LAB — URINALYSIS, MICROSCOPIC ONLY
Glucose, UA: NEGATIVE mg/dL
Specific Gravity, Urine: 1.016 (ref 1.005–1.030)
Urobilinogen, UA: 0.2 mg/dL (ref 0.0–1.0)
pH: 8 (ref 5.0–8.0)

## 2010-03-16 LAB — ASPERGILLUS GALACTOMANNAN ANTIGEN: Aspergillus galactomannan Index: 0.3 (ref <0.5)

## 2010-03-16 LAB — PROTIME-INR: Prothrombin Time: 16 seconds — ABNORMAL HIGH (ref 11.6–15.2)

## 2010-03-16 LAB — URINE MICROSCOPIC-ADD ON

## 2010-03-16 LAB — BRAIN NATRIURETIC PEPTIDE
Pro B Natriuretic peptide (BNP): 1100 pg/mL — ABNORMAL HIGH (ref 0.0–100.0)
Pro B Natriuretic peptide (BNP): 186 pg/mL — ABNORMAL HIGH (ref 0.0–100.0)
Pro B Natriuretic peptide (BNP): 412 pg/mL — ABNORMAL HIGH (ref 0.0–100.0)

## 2010-03-16 LAB — HSV(HERPES SIMPLEX VRS) I + II AB-IGM: Herpes Simplex Vrs I&II-IgM Ab (EIA): 0.18 INDEX

## 2010-03-16 LAB — CULTURE, ROUTINE-SINUS

## 2010-03-16 LAB — CSF CULTURE W GRAM STAIN: Culture: NO GROWTH

## 2010-03-16 LAB — IRON AND TIBC: Iron: 15 ug/dL — ABNORMAL LOW (ref 42–135)

## 2010-03-16 LAB — APTT: aPTT: 40 seconds — ABNORMAL HIGH (ref 24–37)

## 2010-03-16 LAB — TYPE AND SCREEN
ABO/RH(D): O POS
Antibody Screen: NEGATIVE

## 2010-03-16 LAB — OSMOLALITY, URINE: Osmolality, Ur: 404 mOsm/kg (ref 390–1090)

## 2010-03-16 LAB — CARBOXYHEMOGLOBIN
Carboxyhemoglobin: 1.3 % (ref 0.5–1.5)
Methemoglobin: 0.8 % (ref 0.0–1.5)
Total hemoglobin: 10 g/dL — ABNORMAL LOW (ref 12.5–16.0)

## 2010-03-16 LAB — LACTIC ACID, PLASMA: Lactic Acid, Venous: 1.8 mmol/L (ref 0.5–2.2)

## 2010-03-16 LAB — QUANTIFERON TB GOLD ASSAY (BLOOD): TB Antigen Minus Nil Value: 0 IU/mL

## 2010-03-16 LAB — FERRITIN: Ferritin: 4032 ng/mL — ABNORMAL HIGH (ref 10–291)

## 2010-03-16 LAB — PROCALCITONIN: Procalcitonin: 1.07 ng/mL

## 2010-03-16 LAB — CRYPTOCOCCAL ANTIGEN: Crypto Ag: NEGATIVE

## 2010-03-17 LAB — GLUCOSE, CAPILLARY
Glucose-Capillary: 100 mg/dL — ABNORMAL HIGH (ref 70–99)
Glucose-Capillary: 146 mg/dL — ABNORMAL HIGH (ref 70–99)
Glucose-Capillary: 159 mg/dL — ABNORMAL HIGH (ref 70–99)
Glucose-Capillary: 161 mg/dL — ABNORMAL HIGH (ref 70–99)
Glucose-Capillary: 181 mg/dL — ABNORMAL HIGH (ref 70–99)
Glucose-Capillary: 79 mg/dL (ref 70–99)
Glucose-Capillary: 88 mg/dL (ref 70–99)
Glucose-Capillary: 188 mg/dL — ABNORMAL HIGH (ref 70–99)
Glucose-Capillary: 62 mg/dL — ABNORMAL LOW (ref 70–99)
Glucose-Capillary: 94 mg/dL (ref 70–99)

## 2010-03-17 LAB — BRAIN NATRIURETIC PEPTIDE: Pro B Natriuretic peptide (BNP): 1040 pg/mL — ABNORMAL HIGH (ref 0.0–100.0)

## 2010-03-17 LAB — CROSSMATCH: Unit division: 0

## 2010-03-17 LAB — DIFFERENTIAL
Basophils Absolute: 0 10*3/uL (ref 0.0–0.1)
Basophils Relative: 0 % (ref 0–1)
Basophils Relative: 1 % (ref 0–1)
Eosinophils Absolute: 0 10*3/uL (ref 0.0–0.7)
Eosinophils Relative: 0 % (ref 0–5)
Eosinophils Relative: 0 % (ref 0–5)
Lymphocytes Relative: 7 % — ABNORMAL LOW (ref 12–46)
Monocytes Relative: 28 % — ABNORMAL HIGH (ref 3–12)
Neutrophils Relative %: 65 % (ref 43–77)
Neutrophils Relative %: 72 % (ref 43–77)

## 2010-03-17 LAB — PREPARE PLATELETS: Unit division: 0

## 2010-03-17 LAB — CARDIAC PANEL(CRET KIN+CKTOT+MB+TROPI)
CK, MB: 18.7 ng/mL (ref 0.3–4.0)
CK, MB: 48.9 ng/mL (ref 0.3–4.0)
Total CK: 521 U/L — ABNORMAL HIGH (ref 7–177)

## 2010-03-17 LAB — CBC
HCT: 21 % — ABNORMAL LOW (ref 36.0–46.0)
HCT: 21.8 % — ABNORMAL LOW (ref 36.0–46.0)
HCT: 29.9 % — ABNORMAL LOW (ref 36.0–46.0)
Hemoglobin: 7.1 g/dL — ABNORMAL LOW (ref 12.0–15.0)
Hemoglobin: 7.5 g/dL — ABNORMAL LOW (ref 12.0–15.0)
Hemoglobin: 10.3 g/dL — ABNORMAL LOW (ref 12.0–15.0)
Hemoglobin: 9.2 g/dL — ABNORMAL LOW (ref 12.0–15.0)
MCH: 30.6 pg (ref 26.0–34.0)
MCH: 30.9 pg (ref 26.0–34.0)
MCHC: 33.8 g/dL (ref 30.0–36.0)
MCHC: 34.6 g/dL (ref 30.0–36.0)
MCV: 89 fL (ref 78.0–100.0)
Platelets: 16 10*3/uL — CL (ref 150–400)
RBC: 2.27 MIL/uL — ABNORMAL LOW (ref 3.87–5.11)
RBC: 2.45 MIL/uL — ABNORMAL LOW (ref 3.87–5.11)
RBC: 2.46 MIL/uL — ABNORMAL LOW (ref 3.87–5.11)
RBC: 3.4 MIL/uL — ABNORMAL LOW (ref 3.87–5.11)
RDW: 18.2 % — ABNORMAL HIGH (ref 11.5–15.5)
WBC: <0.4 10*3/uL — CL (ref 4.0–10.5)

## 2010-03-17 LAB — COMPREHENSIVE METABOLIC PANEL
ALT: 159 U/L — ABNORMAL HIGH (ref 0–35)
ALT: 19 U/L (ref 0–35)
AST: 247 U/L — ABNORMAL HIGH (ref 0–37)
AST: 23 U/L (ref 0–37)
AST: <5 U/L (ref 0–37)
Alkaline Phosphatase: 82 U/L (ref 39–117)
BUN: 24 mg/dL — ABNORMAL HIGH (ref 6–23)
CO2: 24 mEq/L (ref 19–32)
CO2: 26 mEq/L (ref 19–32)
CO2: 18 mEq/L — ABNORMAL LOW (ref 19–32)
CO2: 27 mEq/L (ref 19–32)
Calcium: 6.6 mg/dL — ABNORMAL LOW (ref 8.4–10.5)
Calcium: 8.4 mg/dL (ref 8.4–10.5)
Chloride: 105 mEq/L (ref 96–112)
Chloride: 102 mEq/L (ref 96–112)
Creatinine, Ser: 1.33 mg/dL — ABNORMAL HIGH (ref 0.4–1.2)
Creatinine, Ser: 2.76 mg/dL — ABNORMAL HIGH (ref 0.4–1.2)
GFR calc Af Amer: 43 mL/min — ABNORMAL LOW (ref >60)
GFR calc Af Amer: 22 mL/min — ABNORMAL LOW (ref >60)
GFR calc Af Amer: 58 mL/min — ABNORMAL LOW (ref >60)
GFR calc non Af Amer: 36 mL/min — ABNORMAL LOW (ref >60)
GFR calc non Af Amer: 42 mL/min — ABNORMAL LOW (ref >60)
GFR calc non Af Amer: 18 mL/min — ABNORMAL LOW (ref >60)
GFR calc non Af Amer: 48 mL/min — ABNORMAL LOW (ref >60)
Glucose, Bld: 155 mg/dL — ABNORMAL HIGH (ref 70–99)
Glucose, Bld: 80 mg/dL (ref 70–99)
Sodium: 140 mEq/L (ref 135–145)
Sodium: 138 mEq/L (ref 135–145)
Total Bilirubin: 1.1 mg/dL (ref 0.3–1.2)
Total Bilirubin: 0.5 mg/dL (ref 0.3–1.2)
Total Protein: 4.4 g/dL — ABNORMAL LOW (ref 6.0–8.3)
Total Protein: 5.5 g/dL — ABNORMAL LOW (ref 6.0–8.3)

## 2010-03-17 LAB — POCT I-STAT 3, ART BLOOD GAS (G3+)
Acid-Base Excess: 9 mmol/L — ABNORMAL HIGH (ref 0.0–2.0)
Acid-base deficit: 12 mmol/L — ABNORMAL HIGH (ref 0.0–2.0)
Acid-base deficit: 14 mmol/L — ABNORMAL HIGH (ref 0.0–2.0)
Bicarbonate: 13.4 mEq/L — ABNORMAL LOW (ref 20.0–24.0)
Bicarbonate: 14.2 mEq/L — ABNORMAL LOW (ref 20.0–24.0)
Bicarbonate: 17.9 mEq/L — ABNORMAL LOW (ref 20.0–24.0)
Bicarbonate: 30.6 mEq/L — ABNORMAL HIGH (ref 20.0–24.0)
O2 Saturation: 100 %
O2 Saturation: 86 %
O2 Saturation: 96 %
Patient temperature: 98.7
Patient temperature: 98.7
TCO2: 14 mmol/L (ref 0–100)
TCO2: 18 mmol/L (ref 0–100)
TCO2: 19 mmol/L (ref 0–100)
TCO2: 32 mmol/L (ref 0–100)
pCO2 arterial: 30.5 mmHg — ABNORMAL LOW (ref 35.0–45.0)
pCO2 arterial: 31 mmHg — ABNORMAL LOW (ref 35.0–45.0)
pH, Arterial: 7.166 — CL (ref 7.350–7.400)
pH, Arterial: 7.372 (ref 7.350–7.400)
pH, Arterial: 7.624 (ref 7.350–7.400)
pO2, Arterial: 64 mmHg — ABNORMAL LOW (ref 80.0–100.0)
pO2, Arterial: 94 mmHg (ref 80.0–100.0)

## 2010-03-17 LAB — PROCALCITONIN: Procalcitonin: 68.11 ng/mL

## 2010-03-17 LAB — CULTURE, BLOOD (ROUTINE X 2)
Culture: NO GROWTH
Culture: NO GROWTH

## 2010-03-17 LAB — BLOOD GAS, ARTERIAL
TCO2: 17.1 mmol/L (ref 0–100)
pCO2 arterial: 29.7 mmHg — ABNORMAL LOW (ref 35.0–45.0)
pH, Arterial: 7.393 (ref 7.350–7.400)

## 2010-03-17 LAB — URIC ACID: Uric Acid, Serum: 4.2 mg/dL (ref 2.4–7.0)

## 2010-03-17 LAB — MRSA PCR SCREENING: MRSA by PCR: NEGATIVE

## 2010-03-17 LAB — D-DIMER, QUANTITATIVE: D-Dimer, Quant: 1.86 ug/mL-FEU — ABNORMAL HIGH (ref 0.00–0.48)

## 2010-03-17 LAB — CARBOXYHEMOGLOBIN
Carboxyhemoglobin: 0.6 % (ref 0.5–1.5)
Carboxyhemoglobin: 1.1 % (ref 0.5–1.5)
Methemoglobin: 0.7 % (ref 0.0–1.5)
Methemoglobin: 0.8 % (ref 0.0–1.5)
O2 Saturation: 61.4 %
Total hemoglobin: 6.8 g/dL — CL (ref 12.5–16.0)

## 2010-03-17 LAB — PREPARE RBC (CROSSMATCH)

## 2010-03-17 LAB — LIPASE, BLOOD: Lipase: 13 U/L (ref 11–59)

## 2010-03-17 LAB — URINALYSIS, ROUTINE W REFLEX MICROSCOPIC
Leukocytes, UA: NEGATIVE
Nitrite: POSITIVE — AB
Protein, ur: 100 mg/dL — AB
Urobilinogen, UA: 0.2 mg/dL (ref 0.0–1.0)

## 2010-03-17 LAB — BASIC METABOLIC PANEL
BUN: 23 mg/dL (ref 6–23)
CO2: 17 mEq/L — ABNORMAL LOW (ref 19–32)
Chloride: 109 mEq/L (ref 96–112)
Creatinine, Ser: 1.27 mg/dL — ABNORMAL HIGH (ref 0.4–1.2)
GFR calc Af Amer: 39 mL/min — ABNORMAL LOW (ref >60)
GFR calc non Af Amer: 44 mL/min — ABNORMAL LOW (ref >60)
Glucose, Bld: 113 mg/dL — ABNORMAL HIGH (ref 70–99)
Potassium: 3.1 mEq/L — ABNORMAL LOW (ref 3.5–5.1)
Potassium: 3.5 mEq/L (ref 3.5–5.1)
Sodium: 140 mEq/L (ref 135–145)

## 2010-03-17 LAB — CULTURE, BAL-QUANTITATIVE W GRAM STAIN: Colony Count: 3000

## 2010-03-17 LAB — CK TOTAL AND CKMB (NOT AT ARMC)
Relative Index: 5.8 — ABNORMAL HIGH (ref 0.0–2.5)
Total CK: 432 U/L — ABNORMAL HIGH (ref 7–177)

## 2010-03-17 LAB — URINE CULTURE: Culture  Setup Time: 201110292005

## 2010-03-17 LAB — LEGIONELLA ANTIGEN, URINE

## 2010-03-17 LAB — LACTATE DEHYDROGENASE: LDH: 164 U/L (ref 94–250)

## 2010-03-17 LAB — STREP PNEUMONIAE URINARY ANTIGEN: Strep Pneumo Urinary Antigen: POSITIVE — AB

## 2010-03-17 LAB — AMYLASE: Amylase: 17 U/L (ref 0–105)

## 2010-03-17 LAB — LACTIC ACID, PLASMA: Lactic Acid, Venous: 10.1 mmol/L — ABNORMAL HIGH (ref 0.5–2.2)

## 2010-03-17 LAB — MAGNESIUM
Magnesium: 1.8 mg/dL (ref 1.5–2.5)
Magnesium: 2 mg/dL (ref 1.5–2.5)

## 2010-03-18 ENCOUNTER — Other Ambulatory Visit (HOSPITAL_COMMUNITY): Payer: Self-pay

## 2010-03-18 LAB — GLUCOSE, CAPILLARY
Glucose-Capillary: 107 mg/dL — ABNORMAL HIGH (ref 70–99)
Glucose-Capillary: 111 mg/dL — ABNORMAL HIGH (ref 70–99)
Glucose-Capillary: 93 mg/dL (ref 70–99)
Glucose-Capillary: 106 mg/dL — ABNORMAL HIGH (ref 70–99)
Glucose-Capillary: 124 mg/dL — ABNORMAL HIGH (ref 70–99)
Glucose-Capillary: 124 mg/dL — ABNORMAL HIGH (ref 70–99)
Glucose-Capillary: 155 mg/dL — ABNORMAL HIGH (ref 70–99)
Glucose-Capillary: 163 mg/dL — ABNORMAL HIGH (ref 70–99)
Glucose-Capillary: 193 mg/dL — ABNORMAL HIGH (ref 70–99)
Glucose-Capillary: 88 mg/dL (ref 70–99)

## 2010-03-18 LAB — DIFFERENTIAL
Basophils Absolute: 0 10*3/uL (ref 0.0–0.1)
Basophils Absolute: 0 10*3/uL (ref 0.0–0.1)
Basophils Absolute: 0 10*3/uL (ref 0.0–0.1)
Basophils Absolute: 0 10*3/uL (ref 0.0–0.1)
Basophils Relative: 0 % (ref 0–1)
Basophils Relative: 1 % (ref 0–1)
Basophils Relative: 0 % (ref 0–1)
Basophils Relative: 0 % (ref 0–1)
Eosinophils Absolute: 0 10*3/uL (ref 0.0–0.7)
Eosinophils Absolute: 0 10*3/uL (ref 0.0–0.7)
Eosinophils Relative: 0 % (ref 0–5)
Eosinophils Relative: 2 % (ref 0–5)
Eosinophils Relative: 0 % (ref 0–5)
Eosinophils Relative: 0 % (ref 0–5)
Eosinophils Relative: 0 % (ref 0–5)
Lymphocytes Relative: 13 % (ref 12–46)
Lymphocytes Relative: 3 % — ABNORMAL LOW (ref 12–46)
Lymphocytes Relative: 5 % — ABNORMAL LOW (ref 12–46)
Lymphocytes Relative: 7 % — ABNORMAL LOW (ref 12–46)
Lymphs Abs: 0.8 10*3/uL (ref 0.7–4.0)
Lymphs Abs: 1 10*3/uL (ref 0.7–4.0)
Lymphs Abs: 0.7 10*3/uL (ref 0.7–4.0)
Lymphs Abs: 0.8 10*3/uL (ref 0.7–4.0)
Monocytes Absolute: 0.5 10*3/uL (ref 0.1–1.0)
Monocytes Absolute: 1.1 10*3/uL — ABNORMAL HIGH (ref 0.1–1.0)
Monocytes Absolute: 0.7 10*3/uL (ref 0.1–1.0)
Monocytes Relative: 14 % — ABNORMAL HIGH (ref 3–12)
Monocytes Relative: 3 % (ref 3–12)
Neutro Abs: 4.3 10*3/uL (ref 1.7–7.7)
Neutro Abs: 12 10*3/uL — ABNORMAL HIGH (ref 1.7–7.7)
Neutro Abs: 23.4 10*3/uL — ABNORMAL HIGH (ref 1.7–7.7)
Neutro Abs: 8.5 10*3/uL — ABNORMAL HIGH (ref 1.7–7.7)
Neutrophils Relative %: 80 % — ABNORMAL HIGH (ref 43–77)
Neutrophils Relative %: 89 % — ABNORMAL HIGH (ref 43–77)
Neutrophils Relative %: 89 % — ABNORMAL HIGH (ref 43–77)
Neutrophils Relative %: 92 % — ABNORMAL HIGH (ref 43–77)
Neutrophils Relative %: 93 % — ABNORMAL HIGH (ref 43–77)

## 2010-03-18 LAB — BASIC METABOLIC PANEL
BUN: 6 mg/dL (ref 6–23)
BUN: 10 mg/dL (ref 6–23)
BUN: 14 mg/dL (ref 6–23)
Calcium: 8.7 mg/dL (ref 8.4–10.5)
Calcium: 8.4 mg/dL (ref 8.4–10.5)
Calcium: 8.4 mg/dL (ref 8.4–10.5)
Chloride: 100 mEq/L (ref 96–112)
Chloride: 111 mEq/L (ref 96–112)
Creatinine, Ser: 0.83 mg/dL (ref 0.4–1.2)
GFR calc Af Amer: >60 mL/min (ref >60)
GFR calc non Af Amer: >60 mL/min (ref >60)
GFR calc non Af Amer: 55 mL/min — ABNORMAL LOW (ref >60)
GFR calc non Af Amer: >60 mL/min (ref >60)
Glucose, Bld: 90 mg/dL (ref 70–99)
Glucose, Bld: 145 mg/dL — ABNORMAL HIGH (ref 70–99)
Potassium: 2.9 mEq/L — ABNORMAL LOW (ref 3.5–5.1)
Potassium: 3.5 mEq/L (ref 3.5–5.1)
Potassium: 4.4 mEq/L (ref 3.5–5.1)
Sodium: 137 mEq/L (ref 135–145)
Sodium: 141 mEq/L (ref 135–145)

## 2010-03-18 LAB — URINALYSIS, ROUTINE W REFLEX MICROSCOPIC
Glucose, UA: NEGATIVE mg/dL
Protein, ur: NEGATIVE mg/dL

## 2010-03-18 LAB — CBC
HCT: 25.3 % — ABNORMAL LOW (ref 36.0–46.0)
HCT: 33.1 % — ABNORMAL LOW (ref 36.0–46.0)
Hemoglobin: 10.9 g/dL — ABNORMAL LOW (ref 12.0–15.0)
Hemoglobin: 11.7 g/dL — ABNORMAL LOW (ref 12.0–15.0)
Hemoglobin: 11.1 g/dL — ABNORMAL LOW (ref 12.0–15.0)
Hemoglobin: 8.5 g/dL — ABNORMAL LOW (ref 12.0–15.0)
Hemoglobin: 9.6 g/dL — ABNORMAL LOW (ref 12.0–15.0)
MCH: 28.3 pg (ref 26.0–34.0)
MCH: 28.7 pg (ref 26.0–34.0)
MCH: 28.2 pg (ref 26.0–34.0)
MCHC: 33.6 g/dL (ref 30.0–36.0)
MCHC: 33.7 g/dL (ref 30.0–36.0)
MCHC: 34.4 g/dL (ref 30.0–36.0)
MCHC: 33.5 g/dL (ref 30.0–36.0)
MCHC: 34 g/dL (ref 30.0–36.0)
MCV: 86.3 fL (ref 78.0–100.0)
MCV: 84.7 fL (ref 78.0–100.0)
Platelets: 178 10*3/uL (ref 150–400)
Platelets: 195 10*3/uL (ref 150–400)
Platelets: 287 10*3/uL (ref 150–400)
Platelets: 191 10*3/uL (ref 150–400)
RBC: 2.71 MIL/uL — ABNORMAL LOW (ref 3.87–5.11)
RBC: 4.11 MIL/uL (ref 3.87–5.11)
RBC: 2.74 MIL/uL — ABNORMAL LOW (ref 3.87–5.11)
RDW: 19.9 % — ABNORMAL HIGH (ref 11.5–15.5)
RDW: 21.6 % — ABNORMAL HIGH (ref 11.5–15.5)
RDW: 20.5 % — ABNORMAL HIGH (ref 11.5–15.5)
RDW: 20.6 % — ABNORMAL HIGH (ref 11.5–15.5)
WBC: 5.5 10*3/uL (ref 4.0–10.5)
WBC: 8.6 10*3/uL (ref 4.0–10.5)
WBC: 13.4 10*3/uL — ABNORMAL HIGH (ref 4.0–10.5)
WBC: 24.1 10*3/uL — ABNORMAL HIGH (ref 4.0–10.5)
WBC: 9.6 10*3/uL (ref 4.0–10.5)

## 2010-03-18 LAB — CARDIAC PANEL(CRET KIN+CKTOT+MB+TROPI)
CK, MB: 0.9 ng/mL (ref 0.3–4.0)
Relative Index: INVALID (ref 0.0–2.5)
Relative Index: INVALID (ref 0.0–2.5)
Relative Index: INVALID (ref 0.0–2.5)
Total CK: 14 U/L (ref 7–177)
Total CK: 24 U/L (ref 7–177)
Troponin I: 0.02 ng/mL (ref 0.00–0.06)

## 2010-03-18 LAB — COMPREHENSIVE METABOLIC PANEL
ALT: 27 U/L (ref 0–35)
AST: 25 U/L (ref 0–37)
AST: 34 U/L (ref 0–37)
AST: 22 U/L (ref 0–37)
Albumin: 3.2 g/dL — ABNORMAL LOW (ref 3.5–5.2)
Albumin: 3.2 g/dL — ABNORMAL LOW (ref 3.5–5.2)
Alkaline Phosphatase: 64 U/L (ref 39–117)
Alkaline Phosphatase: 74 U/L (ref 39–117)
BUN: 10 mg/dL (ref 6–23)
BUN: 13 mg/dL (ref 6–23)
CO2: 26 mEq/L (ref 19–32)
CO2: 24 mEq/L (ref 19–32)
CO2: 24 mEq/L (ref 19–32)
Calcium: 9.9 mg/dL (ref 8.4–10.5)
Calcium: 8.3 mg/dL — ABNORMAL LOW (ref 8.4–10.5)
Chloride: 102 mEq/L (ref 96–112)
Creatinine, Ser: 1.21 mg/dL — ABNORMAL HIGH (ref 0.4–1.2)
Creatinine, Ser: 1.15 mg/dL (ref 0.4–1.2)
GFR calc Af Amer: 56 mL/min — ABNORMAL LOW (ref >60)
GFR calc Af Amer: 60 mL/min — ABNORMAL LOW (ref >60)
GFR calc non Af Amer: 49 mL/min — ABNORMAL LOW (ref >60)
Glucose, Bld: 148 mg/dL — ABNORMAL HIGH (ref 70–99)
Potassium: 4.1 mEq/L (ref 3.5–5.1)
Potassium: 3.3 mEq/L — ABNORMAL LOW (ref 3.5–5.1)
Sodium: 135 mEq/L (ref 135–145)
Sodium: 137 mEq/L (ref 135–145)
Total Bilirubin: 0.5 mg/dL (ref 0.3–1.2)
Total Protein: 8.3 g/dL (ref 6.0–8.3)
Total Protein: 6.2 g/dL (ref 6.0–8.3)

## 2010-03-18 LAB — CROSSMATCH
ABO/RH(D): O POS
Antibody Screen: NEGATIVE

## 2010-03-18 LAB — CULTURE, BLOOD (ROUTINE X 2)
Culture: NO GROWTH
Report Status: 9172011

## 2010-03-18 LAB — IGG, IGA, IGM: IgG (Immunoglobin G), Serum: 3370 mg/dL — ABNORMAL HIGH (ref 694–1618)

## 2010-03-18 LAB — MRSA PCR SCREENING: MRSA by PCR: NEGATIVE

## 2010-03-18 LAB — URINE CULTURE: Colony Count: 30000

## 2010-03-18 LAB — MAGNESIUM
Magnesium: 1.4 mg/dL — ABNORMAL LOW (ref 1.5–2.5)
Magnesium: 1.9 mg/dL (ref 1.5–2.5)

## 2010-03-18 LAB — LIPID PANEL
Cholesterol: 153 mg/dL (ref 0–200)
LDL Cholesterol: 78 mg/dL (ref 0–99)
Triglycerides: 146 mg/dL (ref <150)

## 2010-03-18 LAB — HEMOGLOBIN A1C
Hgb A1c MFr Bld: 7.2 % — ABNORMAL HIGH (ref <5.7)
Mean Plasma Glucose: 160 mg/dL — ABNORMAL HIGH (ref <117)

## 2010-03-18 LAB — RAPID URINE DRUG SCREEN, HOSP PERFORMED: Tetrahydrocannabinol: NOT DETECTED

## 2010-03-19 ENCOUNTER — Encounter (HOSPITAL_COMMUNITY): Payer: BC Managed Care – PPO | Attending: Oncology

## 2010-03-19 ENCOUNTER — Inpatient Hospital Stay (HOSPITAL_COMMUNITY): Payer: BC Managed Care – PPO

## 2010-03-19 DIAGNOSIS — Z5112 Encounter for antineoplastic immunotherapy: Secondary | ICD-10-CM

## 2010-03-19 DIAGNOSIS — C8589 Other specified types of non-Hodgkin lymphoma, extranodal and solid organ sites: Secondary | ICD-10-CM

## 2010-03-19 LAB — CBC
HCT: 23.2 % — ABNORMAL LOW (ref 36.0–46.0)
HCT: 30.3 % — ABNORMAL LOW (ref 36.0–46.0)
HCT: 30.4 % — ABNORMAL LOW (ref 36.0–46.0)
HCT: 30.4 % — ABNORMAL LOW (ref 36.0–46.0)
HCT: 31.3 % — ABNORMAL LOW (ref 36.0–46.0)
HCT: 32.2 % — ABNORMAL LOW (ref 36.0–46.0)
HCT: 32.8 % — ABNORMAL LOW (ref 36.0–46.0)
Hemoglobin: 10 g/dL — ABNORMAL LOW (ref 12.0–15.0)
Hemoglobin: 10.2 g/dL — ABNORMAL LOW (ref 12.0–15.0)
Hemoglobin: 10.6 g/dL — ABNORMAL LOW (ref 12.0–15.0)
Hemoglobin: 10.7 g/dL — ABNORMAL LOW (ref 12.0–15.0)
Hemoglobin: 7.7 g/dL — ABNORMAL LOW (ref 12.0–15.0)
MCH: 26.2 pg (ref 26.0–34.0)
MCH: 27 pg (ref 26.0–34.0)
MCH: 27.4 pg (ref 26.0–34.0)
MCH: 27.5 pg (ref 26.0–34.0)
MCH: 27.5 pg (ref 26.0–34.0)
MCHC: 33 g/dL (ref 30.0–36.0)
MCHC: 33.2 g/dL (ref 30.0–36.0)
MCHC: 33.3 g/dL (ref 30.0–36.0)
MCHC: 33.8 g/dL (ref 30.0–36.0)
MCHC: 33.9 g/dL (ref 30.0–36.0)
MCHC: 34 g/dL (ref 30.0–36.0)
MCV: 79 fL (ref 78.0–100.0)
MCV: 81.2 fL (ref 78.0–100.0)
MCV: 81.7 fL (ref 78.0–100.0)
MCV: 81.9 fL (ref 78.0–100.0)
Platelets: 123 10*3/uL — ABNORMAL LOW (ref 150–400)
Platelets: 154 10*3/uL (ref 150–400)
Platelets: 64 10*3/uL — ABNORMAL LOW (ref 150–400)
Platelets: 75 10*3/uL — ABNORMAL LOW (ref 150–400)
RBC: 3.59 MIL/uL — ABNORMAL LOW (ref 3.87–5.11)
RBC: 3.71 MIL/uL — ABNORMAL LOW (ref 3.87–5.11)
RBC: 3.88 MIL/uL (ref 3.87–5.11)
RDW: 15.3 % (ref 11.5–15.5)
RDW: 15.6 % — ABNORMAL HIGH (ref 11.5–15.5)
RDW: 15.7 % — ABNORMAL HIGH (ref 11.5–15.5)
RDW: 16.4 % — ABNORMAL HIGH (ref 11.5–15.5)
RDW: 16.5 % — ABNORMAL HIGH (ref 11.5–15.5)
RDW: 17.3 % — ABNORMAL HIGH (ref 11.5–15.5)
WBC: 9.9 10*3/uL (ref 4.0–10.5)
WBC: <0.4 10*3/uL — CL (ref 4.0–10.5)

## 2010-03-19 LAB — DIFFERENTIAL
Basophils Absolute: 0 10*3/uL (ref 0.0–0.1)
Basophils Absolute: 0 10*3/uL (ref 0.0–0.1)
Basophils Absolute: 0 10*3/uL (ref 0.0–0.1)
Basophils Absolute: 0.1 10*3/uL (ref 0.0–0.1)
Basophils Relative: 0 % (ref 0–1)
Basophils Relative: 0 % (ref 0–1)
Basophils Relative: 1 % (ref 0–1)
Eosinophils Absolute: 0 10*3/uL (ref 0.0–0.7)
Eosinophils Absolute: 0 10*3/uL (ref 0.0–0.7)
Eosinophils Absolute: 0 10*3/uL (ref 0.0–0.7)
Eosinophils Absolute: 0 10*3/uL (ref 0.0–0.7)
Eosinophils Absolute: 0 10*3/uL (ref 0.0–0.7)
Eosinophils Relative: 0 % (ref 0–5)
Eosinophils Relative: 0 % (ref 0–5)
Eosinophils Relative: 1 % (ref 0–5)
Lymphocytes Relative: 3 % — ABNORMAL LOW (ref 12–46)
Lymphocytes Relative: 74 % — ABNORMAL HIGH (ref 12–46)
Lymphocytes Relative: 9 % — ABNORMAL LOW (ref 12–46)
Monocytes Absolute: 0.1 10*3/uL (ref 0.1–1.0)
Monocytes Absolute: 0.1 10*3/uL (ref 0.1–1.0)
Monocytes Absolute: 0.1 10*3/uL (ref 0.1–1.0)
Monocytes Absolute: 0.4 10*3/uL (ref 0.1–1.0)
Monocytes Relative: 1 % — ABNORMAL LOW (ref 3–12)
Monocytes Relative: 23 % — ABNORMAL HIGH (ref 3–12)
Neutro Abs: 28.1 10*3/uL — ABNORMAL HIGH (ref 1.7–7.7)
Neutro Abs: 7.1 10*3/uL (ref 1.7–7.7)
Neutro Abs: 8.4 10*3/uL — ABNORMAL HIGH (ref 1.7–7.7)
Neutrophils Relative %: 72 % (ref 43–77)
Neutrophils Relative %: 94 % — ABNORMAL HIGH (ref 43–77)
Neutrophils Relative %: 96 % — ABNORMAL HIGH (ref 43–77)
Smear Review: DECREASED
Smear Review: DECREASED

## 2010-03-19 LAB — URINE CULTURE
Colony Count: 70000
Culture  Setup Time: 201108071945

## 2010-03-19 LAB — CULTURE, BLOOD (ROUTINE X 2)

## 2010-03-19 LAB — GLUCOSE, CAPILLARY
Glucose-Capillary: 108 mg/dL — ABNORMAL HIGH (ref 70–99)
Glucose-Capillary: 125 mg/dL — ABNORMAL HIGH (ref 70–99)
Glucose-Capillary: 161 mg/dL — ABNORMAL HIGH (ref 70–99)
Glucose-Capillary: 167 mg/dL — ABNORMAL HIGH (ref 70–99)
Glucose-Capillary: 175 mg/dL — ABNORMAL HIGH (ref 70–99)
Glucose-Capillary: 187 mg/dL — ABNORMAL HIGH (ref 70–99)
Glucose-Capillary: 191 mg/dL — ABNORMAL HIGH (ref 70–99)

## 2010-03-19 LAB — URINALYSIS, ROUTINE W REFLEX MICROSCOPIC
Ketones, ur: NEGATIVE mg/dL
Leukocytes, UA: NEGATIVE
Nitrite: NEGATIVE
Protein, ur: 30 mg/dL — AB
Urobilinogen, UA: 1 mg/dL (ref 0.0–1.0)
pH: 5.5 (ref 5.0–8.0)

## 2010-03-19 LAB — COMPREHENSIVE METABOLIC PANEL
ALT: 16 U/L (ref 0–35)
Alkaline Phosphatase: 70 U/L (ref 39–117)
BUN: 7 mg/dL (ref 6–23)
CO2: 29 mEq/L (ref 19–32)
Chloride: 101 mEq/L (ref 96–112)
GFR calc non Af Amer: 55 mL/min — ABNORMAL LOW (ref >60)
Glucose, Bld: 127 mg/dL — ABNORMAL HIGH (ref 70–99)
Potassium: 3 mEq/L — ABNORMAL LOW (ref 3.5–5.1)
Total Bilirubin: 0.6 mg/dL (ref 0.3–1.2)

## 2010-03-19 LAB — BASIC METABOLIC PANEL
BUN: 11 mg/dL (ref 6–23)
BUN: 13 mg/dL (ref 6–23)
BUN: 18 mg/dL (ref 6–23)
BUN: 19 mg/dL (ref 6–23)
CO2: 20 mEq/L (ref 19–32)
CO2: 20 mEq/L (ref 19–32)
CO2: 22 mEq/L (ref 19–32)
Calcium: 8.5 mg/dL (ref 8.4–10.5)
Calcium: 8.6 mg/dL (ref 8.4–10.5)
Calcium: 8.7 mg/dL (ref 8.4–10.5)
Calcium: 8.8 mg/dL (ref 8.4–10.5)
Calcium: 8.8 mg/dL (ref 8.4–10.5)
Chloride: 98 mEq/L (ref 96–112)
Creatinine, Ser: 1.06 mg/dL (ref 0.4–1.2)
Creatinine, Ser: 1.1 mg/dL (ref 0.4–1.2)
Creatinine, Ser: 1.43 mg/dL — ABNORMAL HIGH (ref 0.4–1.2)
GFR calc Af Amer: 46 mL/min — ABNORMAL LOW (ref >60)
GFR calc Af Amer: >60 mL/min (ref >60)
GFR calc Af Amer: >60 mL/min (ref >60)
GFR calc Af Amer: >60 mL/min (ref >60)
GFR calc non Af Amer: 49 mL/min — ABNORMAL LOW (ref >60)
GFR calc non Af Amer: 50 mL/min — ABNORMAL LOW (ref >60)
GFR calc non Af Amer: 51 mL/min — ABNORMAL LOW (ref >60)
GFR calc non Af Amer: 52 mL/min — ABNORMAL LOW (ref >60)
GFR calc non Af Amer: 54 mL/min — ABNORMAL LOW (ref >60)
GFR calc non Af Amer: 58 mL/min — ABNORMAL LOW (ref >60)
Glucose, Bld: 108 mg/dL — ABNORMAL HIGH (ref 70–99)
Glucose, Bld: 121 mg/dL — ABNORMAL HIGH (ref 70–99)
Glucose, Bld: 166 mg/dL — ABNORMAL HIGH (ref 70–99)
Glucose, Bld: 176 mg/dL — ABNORMAL HIGH (ref 70–99)
Potassium: 3.1 mEq/L — ABNORMAL LOW (ref 3.5–5.1)
Potassium: 3.5 mEq/L (ref 3.5–5.1)
Potassium: 3.6 mEq/L (ref 3.5–5.1)
Sodium: 128 mEq/L — ABNORMAL LOW (ref 135–145)
Sodium: 139 mEq/L (ref 135–145)
Sodium: 139 mEq/L (ref 135–145)
Sodium: 141 mEq/L (ref 135–145)

## 2010-03-19 LAB — TYPE AND SCREEN
ABO/RH(D): O POS
Antibody Screen: NEGATIVE

## 2010-03-19 LAB — PROTEIN ELECTROPH W RFLX QUANT IMMUNOGLOBULINS
Albumin ELP: 46.3 % — ABNORMAL LOW (ref 55.8–66.1)
Beta 2: 6.9 % — ABNORMAL HIGH (ref 3.2–6.5)
Beta Globulin: 6.5 % (ref 4.7–7.2)
Gamma Globulin: 11.2 % (ref 11.1–18.8)
M-Spike, %: NOT DETECTED g/dL

## 2010-03-19 LAB — VANCOMYCIN, TROUGH: Vancomycin Tr: 17.1 ug/mL (ref 10.0–20.0)

## 2010-03-19 LAB — ALT: ALT: 34 U/L (ref 0–35)

## 2010-03-19 LAB — CULTURE, RESPIRATORY W GRAM STAIN: Culture: NORMAL

## 2010-03-19 LAB — HEMOGLOBIN A1C
Hgb A1c MFr Bld: 7 % — ABNORMAL HIGH (ref <5.7)
Mean Plasma Glucose: 154 mg/dL — ABNORMAL HIGH (ref <117)

## 2010-03-19 LAB — AST: AST: 33 U/L (ref 0–37)

## 2010-03-19 LAB — EXPECTORATED SPUTUM ASSESSMENT W GRAM STAIN, RFLX TO RESP C

## 2010-03-19 LAB — IMMUNOFIXATION ELECTROPHORESIS: IgG (Immunoglobin G), Serum: 657 mg/dL — ABNORMAL LOW (ref 694–1618)

## 2010-03-20 LAB — GLUCOSE, CAPILLARY: Glucose-Capillary: 120 mg/dL — ABNORMAL HIGH (ref 70–99)

## 2010-03-20 LAB — COMPREHENSIVE METABOLIC PANEL
AST: 28 U/L (ref 0–37)
BUN: 11 mg/dL (ref 6–23)
CO2: 29 mEq/L (ref 19–32)
Calcium: 9.3 mg/dL (ref 8.4–10.5)
Chloride: 98 mEq/L (ref 96–112)
Creatinine, Ser: 1.31 mg/dL — ABNORMAL HIGH (ref 0.4–1.2)
GFR calc non Af Amer: 42 mL/min — ABNORMAL LOW (ref >60)
Glucose, Bld: 131 mg/dL — ABNORMAL HIGH (ref 70–99)
Total Bilirubin: 0.4 mg/dL (ref 0.3–1.2)

## 2010-03-20 LAB — CBC
Hemoglobin: 9.7 g/dL — ABNORMAL LOW (ref 12.0–15.0)
MCH: 26.9 pg (ref 26.0–34.0)
MCHC: 33.1 g/dL (ref 30.0–36.0)
MCV: 81.1 fL (ref 78.0–100.0)
RBC: 3.62 MIL/uL — ABNORMAL LOW (ref 3.87–5.11)

## 2010-03-20 LAB — URIC ACID: Uric Acid, Serum: 7.8 mg/dL — ABNORMAL HIGH (ref 2.4–7.0)

## 2010-03-20 LAB — DIFFERENTIAL
Basophils Absolute: 0 10*3/uL (ref 0.0–0.1)
Basophils Relative: 1 % (ref 0–1)
Eosinophils Absolute: 0.1 10*3/uL (ref 0.0–0.7)
Eosinophils Relative: 2 % (ref 0–5)
Lymphocytes Relative: 27 % (ref 12–46)
Lymphs Abs: 1.9 10*3/uL (ref 0.7–4.0)
Monocytes Absolute: 0.5 10*3/uL (ref 0.1–1.0)
Monocytes Relative: 7 % (ref 3–12)
Neutro Abs: 4.5 10*3/uL (ref 1.7–7.7)
Neutrophils Relative %: 63 % (ref 43–77)

## 2010-03-20 LAB — SURGICAL PCR SCREEN: MRSA, PCR: NEGATIVE

## 2010-03-20 LAB — LACTATE DEHYDROGENASE: LDH: 508 U/L — ABNORMAL HIGH (ref 94–250)

## 2010-03-21 LAB — DIFFERENTIAL
Basophils Absolute: 0.1 10*3/uL (ref 0.0–0.1)
Basophils Absolute: 0.1 10*3/uL (ref 0.0–0.1)
Basophils Absolute: 0.1 10*3/uL (ref 0.0–0.1)
Basophils Relative: 1 % (ref 0–1)
Eosinophils Absolute: 0.7 10*3/uL (ref 0.0–0.7)
Eosinophils Absolute: 0.7 10*3/uL (ref 0.0–0.7)
Eosinophils Relative: 5 % (ref 0–5)
Eosinophils Relative: 7 % — ABNORMAL HIGH (ref 0–5)
Eosinophils Relative: 7 % — ABNORMAL HIGH (ref 0–5)
Lymphocytes Relative: 15 % (ref 12–46)
Lymphocytes Relative: 17 % (ref 12–46)
Lymphs Abs: 1.5 10*3/uL (ref 0.7–4.0)
Lymphs Abs: 1.7 10*3/uL (ref 0.7–4.0)
Monocytes Absolute: 0.9 10*3/uL (ref 0.1–1.0)
Monocytes Absolute: 0.9 10*3/uL (ref 0.1–1.0)
Monocytes Relative: 8 % (ref 3–12)
Monocytes Relative: 9 % (ref 3–12)
Neutrophils Relative %: 72 % (ref 43–77)

## 2010-03-21 LAB — COMPREHENSIVE METABOLIC PANEL
ALT: 21 U/L (ref 0–35)
AST: 45 U/L — ABNORMAL HIGH (ref 0–37)
Albumin: 2.7 g/dL — ABNORMAL LOW (ref 3.5–5.2)
Albumin: 3.4 g/dL — ABNORMAL LOW (ref 3.5–5.2)
Alkaline Phosphatase: 79 U/L (ref 39–117)
BUN: 12 mg/dL (ref 6–23)
BUN: 9 mg/dL (ref 6–23)
CO2: 28 mEq/L (ref 19–32)
Calcium: 8.7 mg/dL (ref 8.4–10.5)
Chloride: 101 mEq/L (ref 96–112)
Chloride: 95 mEq/L — ABNORMAL LOW (ref 96–112)
Chloride: 99 mEq/L (ref 96–112)
Creatinine, Ser: 1.34 mg/dL — ABNORMAL HIGH (ref 0.4–1.2)
Creatinine, Ser: 1.34 mg/dL — ABNORMAL HIGH (ref 0.4–1.2)
GFR calc Af Amer: 50 mL/min — ABNORMAL LOW (ref >60)
GFR calc non Af Amer: 41 mL/min — ABNORMAL LOW (ref >60)
Glucose, Bld: 142 mg/dL — ABNORMAL HIGH (ref 70–99)
Potassium: 3.7 mEq/L (ref 3.5–5.1)
Potassium: 4.2 mEq/L (ref 3.5–5.1)
Sodium: 138 mEq/L (ref 135–145)
Total Bilirubin: 0.3 mg/dL (ref 0.3–1.2)
Total Bilirubin: 0.5 mg/dL (ref 0.3–1.2)
Total Bilirubin: 0.7 mg/dL (ref 0.3–1.2)
Total Protein: 6.8 g/dL (ref 6.0–8.3)
Total Protein: 7.7 g/dL (ref 6.0–8.3)

## 2010-03-21 LAB — RETICULOCYTES
RBC.: 3.77 MIL/uL — ABNORMAL LOW (ref 3.87–5.11)
Retic Count, Absolute: 52.8 10*3/uL (ref 19.0–186.0)
Retic Ct Pct: 1.4 % (ref 0.4–3.1)

## 2010-03-21 LAB — URINALYSIS, ROUTINE W REFLEX MICROSCOPIC
Bilirubin Urine: NEGATIVE
Ketones, ur: NEGATIVE mg/dL
Nitrite: NEGATIVE
Urobilinogen, UA: 0.2 mg/dL (ref 0.0–1.0)

## 2010-03-21 LAB — GLUCOSE, CAPILLARY
Glucose-Capillary: 101 mg/dL — ABNORMAL HIGH (ref 70–99)
Glucose-Capillary: 102 mg/dL — ABNORMAL HIGH (ref 70–99)
Glucose-Capillary: 107 mg/dL — ABNORMAL HIGH (ref 70–99)
Glucose-Capillary: 116 mg/dL — ABNORMAL HIGH (ref 70–99)
Glucose-Capillary: 119 mg/dL — ABNORMAL HIGH (ref 70–99)
Glucose-Capillary: 184 mg/dL — ABNORMAL HIGH (ref 70–99)
Glucose-Capillary: 139 mg/dL — ABNORMAL HIGH (ref 70–99)

## 2010-03-21 LAB — CBC
HCT: 29.3 % — ABNORMAL LOW (ref 36.0–46.0)
HCT: 29.5 % — ABNORMAL LOW (ref 36.0–46.0)
Hemoglobin: 9.8 g/dL — ABNORMAL LOW (ref 12.0–15.0)
MCH: 27.1 pg (ref 26.0–34.0)
MCH: 27.4 pg (ref 26.0–34.0)
MCH: 27.6 pg (ref 26.0–34.0)
MCHC: 33.4 g/dL (ref 30.0–36.0)
MCV: 81.9 fL (ref 78.0–100.0)
MCV: 81.9 fL (ref 78.0–100.0)
MCV: 82.9 fL (ref 78.0–100.0)
Platelets: 232 10*3/uL (ref 150–400)
Platelets: 240 10*3/uL (ref 150–400)
Platelets: 276 10*3/uL (ref 150–400)
RBC: 3.5 MIL/uL — ABNORMAL LOW (ref 3.87–5.11)
RBC: 3.6 MIL/uL — ABNORMAL LOW (ref 3.87–5.11)
RBC: 3.77 MIL/uL — ABNORMAL LOW (ref 3.87–5.11)
RDW: 15.8 % — ABNORMAL HIGH (ref 11.5–15.5)
WBC: 10.8 10*3/uL — ABNORMAL HIGH (ref 4.0–10.5)
WBC: 9.6 10*3/uL (ref 4.0–10.5)

## 2010-03-21 LAB — BASIC METABOLIC PANEL
BUN: 11 mg/dL (ref 6–23)
CO2: 26 mEq/L (ref 19–32)
Chloride: 99 mEq/L (ref 96–112)
Creatinine, Ser: 1.23 mg/dL — ABNORMAL HIGH (ref 0.4–1.2)
GFR calc Af Amer: 55 mL/min — ABNORMAL LOW (ref >60)
Glucose, Bld: 99 mg/dL (ref 70–99)

## 2010-03-21 LAB — HEMOGLOBIN A1C
Hgb A1c MFr Bld: 6.9 % — ABNORMAL HIGH (ref <5.7)
Mean Plasma Glucose: 151 mg/dL — ABNORMAL HIGH (ref <117)

## 2010-03-21 LAB — FOLATE: Folate: 10.5 ng/mL

## 2010-03-21 LAB — URINE CULTURE: Colony Count: 9000

## 2010-03-21 LAB — VITAMIN B12: Vitamin B-12: 388 pg/mL (ref 211–911)

## 2010-03-22 ENCOUNTER — Ambulatory Visit (HOSPITAL_COMMUNITY): Payer: Self-pay | Admitting: Oncology

## 2010-03-26 ENCOUNTER — Inpatient Hospital Stay (HOSPITAL_COMMUNITY): Payer: BC Managed Care – PPO

## 2010-03-26 ENCOUNTER — Ambulatory Visit (HOSPITAL_COMMUNITY): Payer: BC Managed Care – PPO | Admitting: Oncology

## 2010-03-26 ENCOUNTER — Encounter (HOSPITAL_COMMUNITY): Payer: BC Managed Care – PPO

## 2010-03-26 DIAGNOSIS — Z5111 Encounter for antineoplastic chemotherapy: Secondary | ICD-10-CM

## 2010-03-26 DIAGNOSIS — C8589 Other specified types of non-Hodgkin lymphoma, extranodal and solid organ sites: Secondary | ICD-10-CM

## 2010-03-31 ENCOUNTER — Ambulatory Visit: Payer: Self-pay | Admitting: Critical Care Medicine

## 2010-04-14 ENCOUNTER — Telehealth: Payer: Self-pay | Admitting: Critical Care Medicine

## 2010-04-14 NOTE — Telephone Encounter (Signed)
Pt states Dr. Regino Schultze started her on Cleocin for her sinus infection and she has concerns. She says one of the side effects of this medication is fungal infections and she is concerned because she has been hospitalized for fungal infection in her lungs in the past. She would like recs from PW on whether she should take this medication. Pls advise. Allergies (verified):  1)  ! Singulair (Montelukast Sodium) 2)  ! Demerol

## 2010-04-14 NOTE — Telephone Encounter (Signed)
Pt aware Dr. Delford Field advised pt should NOT take Cleocin and she should call Dr. Thurmon Fair office for advice on chronic sinus dz. Pt had verbal understanding of this.

## 2010-04-14 NOTE — Telephone Encounter (Signed)
I would NOT take cleocin I would call Dr Thurmon Fair office for guidance for her chronic sinus dz

## 2010-04-20 LAB — BASIC METABOLIC PANEL
BUN: 17 mg/dL (ref 6–23)
Creatinine, Ser: 0.92 mg/dL (ref 0.4–1.2)
GFR calc Af Amer: >60 mL/min (ref >60)
GFR calc non Af Amer: >60 mL/min (ref >60)
Potassium: 4.2 mEq/L (ref 3.5–5.1)

## 2010-04-20 LAB — CBC
HCT: 41.9 % (ref 36.0–46.0)
Hemoglobin: 13.2 g/dL (ref 12.0–15.0)
MCHC: 34.2 g/dL (ref 30.0–36.0)
MCV: 87.2 fL (ref 78.0–100.0)
Platelets: 232 10*3/uL (ref 150–400)
Platelets: 266 10*3/uL (ref 150–400)
RBC: 4.21 MIL/uL (ref 3.87–5.11)
RBC: 4.51 MIL/uL (ref 3.87–5.11)
WBC: 11.9 10*3/uL — ABNORMAL HIGH (ref 4.0–10.5)
WBC: 16.1 10*3/uL — ABNORMAL HIGH (ref 4.0–10.5)
WBC: 16.2 10*3/uL — ABNORMAL HIGH (ref 4.0–10.5)

## 2010-04-20 LAB — COMPREHENSIVE METABOLIC PANEL
ALT: 20 U/L (ref 0–35)
AST: 27 U/L (ref 0–37)
BUN: 12 mg/dL (ref 6–23)
CO2: 29 mEq/L (ref 19–32)
CO2: 31 mEq/L (ref 19–32)
Calcium: 9.5 mg/dL (ref 8.4–10.5)
Chloride: 98 mEq/L (ref 96–112)
Creatinine, Ser: 1.12 mg/dL (ref 0.4–1.2)
Creatinine, Ser: 1.24 mg/dL — ABNORMAL HIGH (ref 0.4–1.2)
GFR calc Af Amer: 55 mL/min — ABNORMAL LOW (ref >60)
GFR calc non Af Amer: 45 mL/min — ABNORMAL LOW (ref >60)
GFR calc non Af Amer: 51 mL/min — ABNORMAL LOW (ref >60)
Glucose, Bld: 113 mg/dL — ABNORMAL HIGH (ref 70–99)
Glucose, Bld: 269 mg/dL — ABNORMAL HIGH (ref 70–99)
Total Bilirubin: 0.6 mg/dL (ref 0.3–1.2)
Total Protein: 7.5 g/dL (ref 6.0–8.3)

## 2010-04-20 LAB — DIFFERENTIAL
Eosinophils Absolute: 1 10*3/uL — ABNORMAL HIGH (ref 0.0–0.7)
Lymphocytes Relative: 27 % (ref 12–46)
Lymphs Abs: 4.3 10*3/uL — ABNORMAL HIGH (ref 0.7–4.0)
Monocytes Relative: 5 % (ref 3–12)
Neutro Abs: 9.9 10*3/uL — ABNORMAL HIGH (ref 1.7–7.7)
Neutrophils Relative %: 61 % (ref 43–77)

## 2010-04-28 ENCOUNTER — Ambulatory Visit (HOSPITAL_COMMUNITY)
Admission: RE | Admit: 2010-04-28 | Discharge: 2010-04-28 | Disposition: A | Discharge status: Home / Self Care | Payer: BC Managed Care – PPO | Source: Ambulatory Visit | Attending: Oncology | Admitting: Oncology

## 2010-04-28 ENCOUNTER — Encounter (HOSPITAL_COMMUNITY): Payer: BC Managed Care – PPO

## 2010-04-28 ENCOUNTER — Other Ambulatory Visit (HOSPITAL_COMMUNITY): Payer: Self-pay | Admitting: Oncology

## 2010-04-28 ENCOUNTER — Encounter (HOSPITAL_COMMUNITY): Payer: BC Managed Care – PPO | Attending: Oncology | Admitting: Oncology

## 2010-04-28 DIAGNOSIS — R0989 Other specified symptoms and signs involving the circulatory and respiratory systems: Secondary | ICD-10-CM | POA: Insufficient documentation

## 2010-04-28 DIAGNOSIS — R05 Cough: Secondary | ICD-10-CM

## 2010-04-28 DIAGNOSIS — R509 Fever, unspecified: Secondary | ICD-10-CM

## 2010-04-28 DIAGNOSIS — J189 Pneumonia, unspecified organism: Secondary | ICD-10-CM

## 2010-04-28 DIAGNOSIS — R059 Cough, unspecified: Secondary | ICD-10-CM | POA: Insufficient documentation

## 2010-04-28 DIAGNOSIS — C8589 Other specified types of non-Hodgkin lymphoma, extranodal and solid organ sites: Secondary | ICD-10-CM | POA: Insufficient documentation

## 2010-04-28 LAB — LACTATE DEHYDROGENASE: LDH: 243 U/L (ref 94–250)

## 2010-04-29 ENCOUNTER — Other Ambulatory Visit (HOSPITAL_COMMUNITY): Payer: Self-pay | Admitting: Oncology

## 2010-04-29 LAB — EXPECTORATED SPUTUM ASSESSMENT W GRAM STAIN, RFLX TO RESP C

## 2010-05-01 LAB — CULTURE, RESPIRATORY W GRAM STAIN

## 2010-05-03 ENCOUNTER — Encounter (HOSPITAL_COMMUNITY): Payer: Self-pay

## 2010-05-03 ENCOUNTER — Encounter (HOSPITAL_COMMUNITY)
Admission: RE | Admit: 2010-05-03 | Discharge: 2010-05-03 | Disposition: A | Discharge status: Home / Self Care | Payer: BC Managed Care – PPO | Source: Ambulatory Visit | Attending: Oncology | Admitting: Oncology

## 2010-05-03 DIAGNOSIS — C8590 Non-Hodgkin lymphoma, unspecified, unspecified site: Secondary | ICD-10-CM

## 2010-05-03 DIAGNOSIS — R509 Fever, unspecified: Secondary | ICD-10-CM

## 2010-05-03 DIAGNOSIS — R0989 Other specified symptoms and signs involving the circulatory and respiratory systems: Secondary | ICD-10-CM

## 2010-05-03 DIAGNOSIS — C8589 Other specified types of non-Hodgkin lymphoma, extranodal and solid organ sites: Secondary | ICD-10-CM | POA: Insufficient documentation

## 2010-05-03 DIAGNOSIS — R05 Cough: Secondary | ICD-10-CM

## 2010-05-03 DIAGNOSIS — R059 Cough, unspecified: Secondary | ICD-10-CM

## 2010-05-03 LAB — GLUCOSE, CAPILLARY: Glucose-Capillary: 81 mg/dL (ref 70–99)

## 2010-05-03 MED ORDER — FLUDEOXYGLUCOSE F - 18 (FDG) INJECTION
17.2000 | Freq: Once | INTRAVENOUS | Status: AC | PRN
  Administered 2010-05-03: 17.2 via INTRAVENOUS

## 2010-05-05 ENCOUNTER — Ambulatory Visit (HOSPITAL_COMMUNITY): Payer: BC Managed Care – PPO | Admitting: Oncology

## 2010-05-11 ENCOUNTER — Other Ambulatory Visit (HOSPITAL_COMMUNITY): Payer: Self-pay | Admitting: Oncology

## 2010-05-11 ENCOUNTER — Encounter (HOSPITAL_COMMUNITY): Payer: BC Managed Care – PPO | Attending: Oncology

## 2010-05-11 DIAGNOSIS — C8589 Other specified types of non-Hodgkin lymphoma, extranodal and solid organ sites: Secondary | ICD-10-CM

## 2010-05-11 LAB — HEMOGLOBIN A1C
Hgb A1c MFr Bld: 6 % — ABNORMAL HIGH (ref <5.7)
Mean Plasma Glucose: 126 mg/dL — ABNORMAL HIGH (ref <117)

## 2010-05-11 LAB — BASIC METABOLIC PANEL
CO2: 28 mEq/L (ref 19–32)
Calcium: 9.9 mg/dL (ref 8.4–10.5)
Creatinine, Ser: 1.09 mg/dL (ref 0.4–1.2)
GFR calc Af Amer: >60 mL/min (ref >60)
Sodium: 140 mEq/L (ref 135–145)

## 2010-05-18 NOTE — Assessment & Plan Note (Signed)
HEALTHCARE                         GASTROENTEROLOGY OFFICE NOTE   NAME:[name], ARTELIA GAME                        MRN:          [id number]  DATE:07/10/2006                            DOB:          July 17, 1955    GASTROENTEROLOGY CONSULTATION   REFERRING PHYSICIAN:  Charlcie Cradle. Delford Field, MD, [facility]   PRIMARY CARE PHYSICIAN:  Kirk Ruths, M.D.   REASON FOR REFERRAL:  Dr. Delford Field asked me to evaluate [name]  regarding chronic cough, underlying GERD.   HISTORY OF PRESENT ILLNESS:  [name] is a very pleasant 55 year old  woman who has had mild GERD symptoms for many years.  She describes it  as a burning in her chest, dyspeptic type sensation, probably happening  for five to ten years.  She has had a chronic cough for approximately  two years.  About 1-1/2 years ago she was evaluated by Dr. Shan Levans who diagnosed her with asthma and also with probable GERD.  She  has been on Zegerid twice daily for a year-and-a-half as well as various  inhalers for her asthma.  Her cough is much, much better but she is  still bothered with intermittent cough.  There is a question of whether  continued GERD is contributing to her ongoing symptoms.  Since starting  the Zegerid twice a day she has had no return of the more traditional  GERD symptoms of acid regurgitation, pyrosis or dyspeptic sensations.  She takes 40 mg of Zegerid twice a day, 30 to 60 minutes prior to her  breakfast meal and her dinner meal.  She is also on Reglan.  She has  been on Reglan t.i.d.  More recently she increased this to t.i.d. and  q.h.s.  She says increasing to bedtime Reglan has definitely made an  improvement in her usually nighttime coughing symptoms.  She has no  dysphagia.   REVIEW OF SYSTEMS:  Notable for no overt GI bleeding, positive for 40  pound intentional weight loss over the past year to year-and-a-half.  The rest of her review of systems is essentially normal and is  available  upon the nursing intake sheet.   PAST MEDICAL HISTORY:  1. Hypertension.  2. Asthma.  3. Borderline diabetes.  4. Elevated cholesterol.  5. Status post hysterectomy in 1994.  6. Status post breast surgery in 1997.  7. Nasal surgery.  8. Heart cath.   CURRENT MEDICATIONS:  1. Hydrochlorothiazide.  2. Qvar.  3. Foradil.  4. Nasonex.  5. Zegerid 40 mg twice daily.  6. Reglan 10 mg t.i.d. and q.h.s.  7. Allergy vaccine once weekly.  8. Astelin two puffs twice daily.  9. Saline wash.  10.Albuterol p.r.n.   ALLERGIES:  DEMEROL, SINGULAIR.   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  Nonsmoker, nondrinker. She drinks two  caffeinated coffees in the morning, no peppermint.   FAMILY HISTORY:  No colon cancer, colon polyps.   PHYSICAL EXAMINATION:  VITAL SIGNS:  Height 5 feet, 3 inches, weight 170  pounds.  Blood pressure 110/70. Pulse 60.  CONSTITUTIONAL:  Generally well-appearing.  NEUROLOGICAL:  Alert and oriented x3.  HEENT:  Eyes:  Extraocular movements intact. Mouth with oropharynx moist  and no lesions.  NECK:  Supple.  No lymphadenopathy.  CARDIOVASCULAR:  Heart has regular rate and rhythm.  LUNGS:  Clear to auscultation bilaterally.  ABDOMEN:  Soft, nontender, nondistended.  Normal bowel sounds.  EXTREMITIES:  No lower extremity edema.  SKIN:  No rashes or lesions on visible extremities.   ASSESSMENT/PLAN:  This is a 55 year old woman with chronic cough,  chronic gastroesophageal reflux disease.   First, her more classical gastroesophageal reflux disease symptoms seem  to be well controlled on proton pump inhibitor.  She has been on Zegerid  twice daily for 1-1/2 years and has had no indigestion, acid  regurgitation or dyspeptic sensations since starting that.  Chronic  cough and asthma can be exacerbated by gastroesophageal reflux disease.  Reglan is not a medicine I would traditionally use to treat  underlying  acid problems but she definitely has noticed improvement since starting  q.h.s. Reglan in addition to her t.i.d. Reglan.  I think for now I want  to proceed with upper endoscopy to see if she has Barrett's esophagus or  chronic gastroesophageal reflux disease damage.  If her esophagus is  normal, then I would likely proceed with ambulatory pH monitoring to  document whether she does have break-through acid  reflux while on medicines.  Twice daily Zegerid may not be needed for  her.  She will try to cut down to once daily and this will hopefully  help her stretch out her co-pays a bit better.     Rachael Fee, MD  Electronically Signed    DPJ/MedQ  DD: 07/10/2006  DT: 07/10/2006  Job #: [id number]   cc:   Kirk Ruths, M.D.  Charlcie Cradle Delford Field, MD, FCCP

## 2010-05-18 NOTE — Assessment & Plan Note (Signed)
[facility]                             PULMONARY OFFICE NOTE   NAME:[name], [name]                        MRN:          [id number]  DATE:05/25/2006                            DOB:          [birth year]    [name] returns today in followup. This is a 55 year old white female  with vocal cord dysfunction, severe reflux disease, asthmatic  bronchitis. She is still having trouble wheezing at night and  regurgitation at night with choking. This is despite being on Reglan 10  mg t.i.d., Zegerid 40 mg daily, Foradil one spray b.i.d., Qvar three  sprays b.i.d. 40 mcg strength, Astelin two sprays each nostril b.i.d.,  saline wash daily.   PHYSICAL EXAMINATION:  Temperature 98.0, blood pressure 128/82, pulse  75, saturation is 94% on room air.  CHEST: Showed distant breath sounds with prolonged expiratory phase. No  wheeze or rhonchi were noted.  CARDIAC: Showed a regular rate and rhythm without S3. Normal S1, S2.  ABDOMEN: Soft, nontender.  EXTREMITIES: Showed no edema or clubbing.  SKIN: Was clear.  NEUROLOGIC: Was intact.  HEENT: Showed no jugular venous distention. No lymphadenopathy.  Oropharynx was clear.  NECK: Supple.   IMPRESSION:  Is that of stable asthma with vocal cord dysfunction  syndrome, reflux disease despite Zegerid and Reglan.   PLAN:  Is to increase Reglan to 10 mg t.i.d. and h.s. and refer to  Gastroenterology for further evaluation. Dr.  Christella Hartigan will see the  patient on June 13, 2006.     [name] Cradle Delford Field, MD, [facility]  Electronically Signed    PEW/MedQ  DD: 05/25/2006  DT: 05/25/2006  Job #: [id number]   cc:   Rachael Fee, MD  Patrica Duel, M.D.

## 2010-05-19 IMAGING — CR DG CHEST 2V
2 series · 2 of 2 positions shown · non-contrast
Comparison: 02/12/2008

CLINICAL DATA: Asthmatic bronchitis

CHEST - 2 VIEW

[view not recorded (1 of 2)]
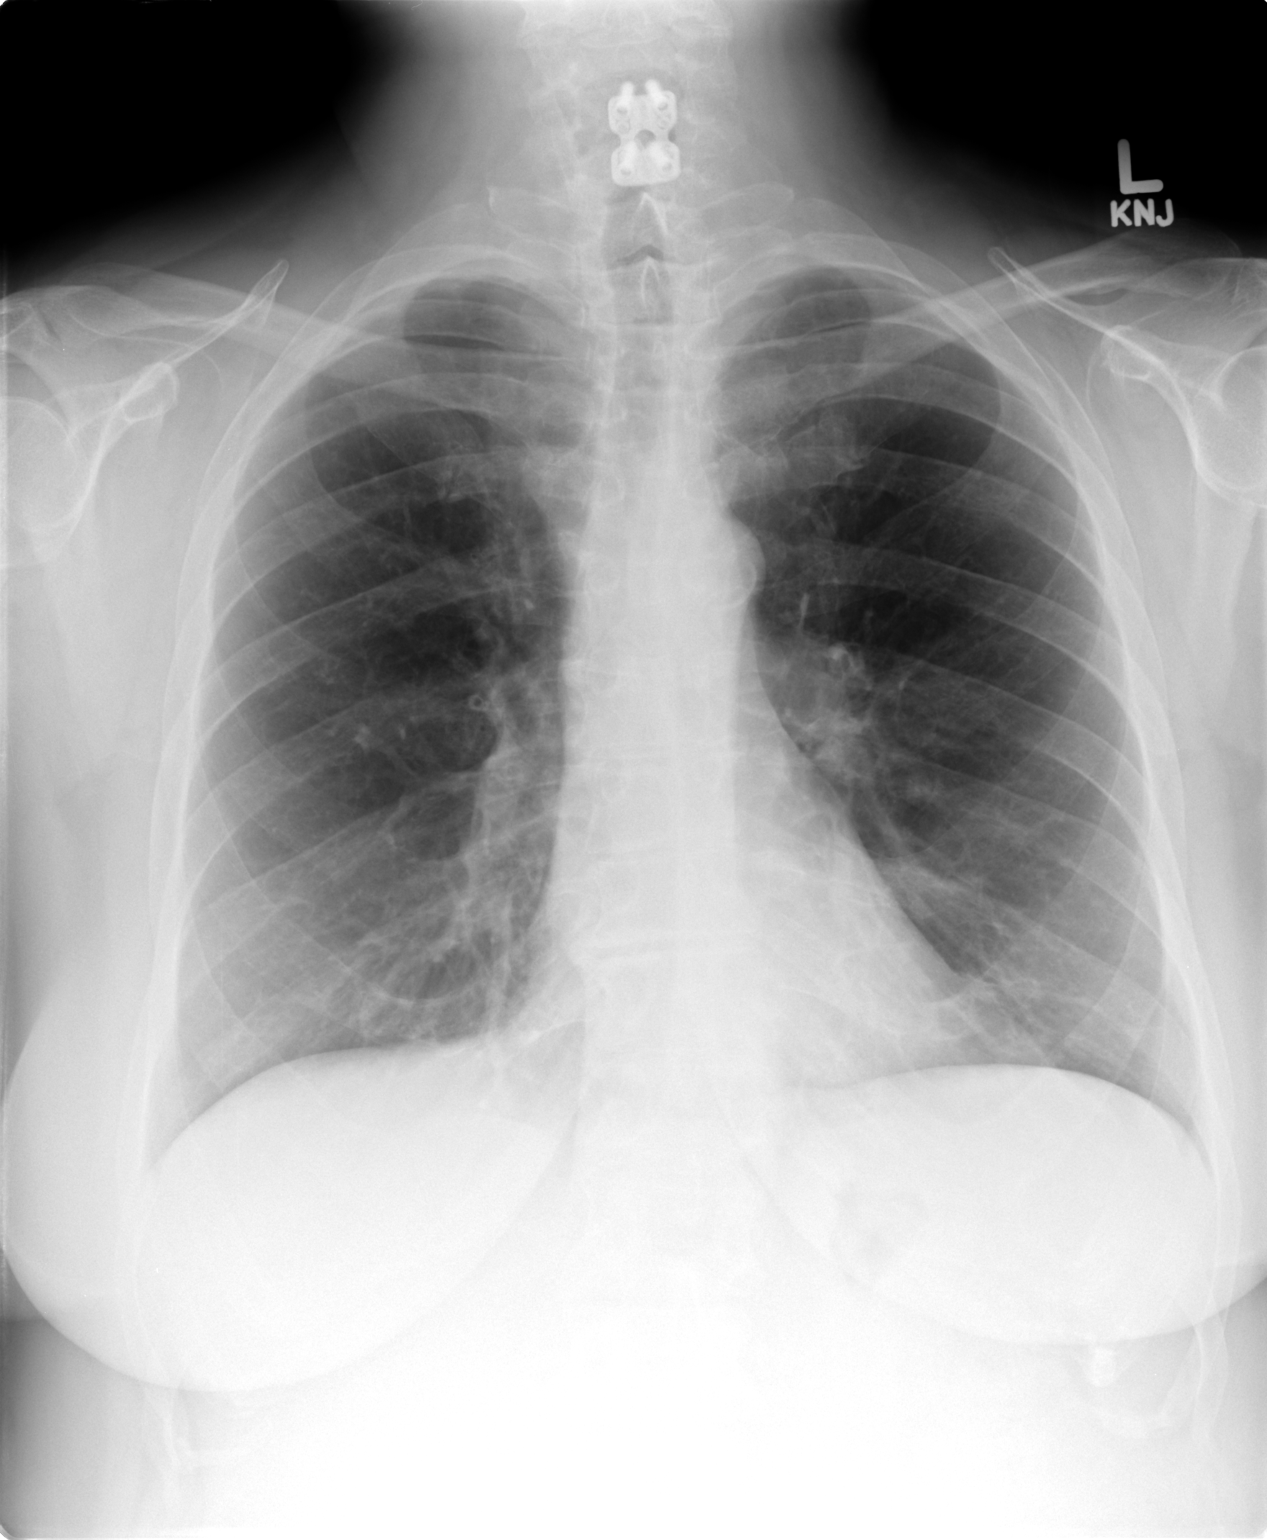

[view not recorded (2 of 2)]
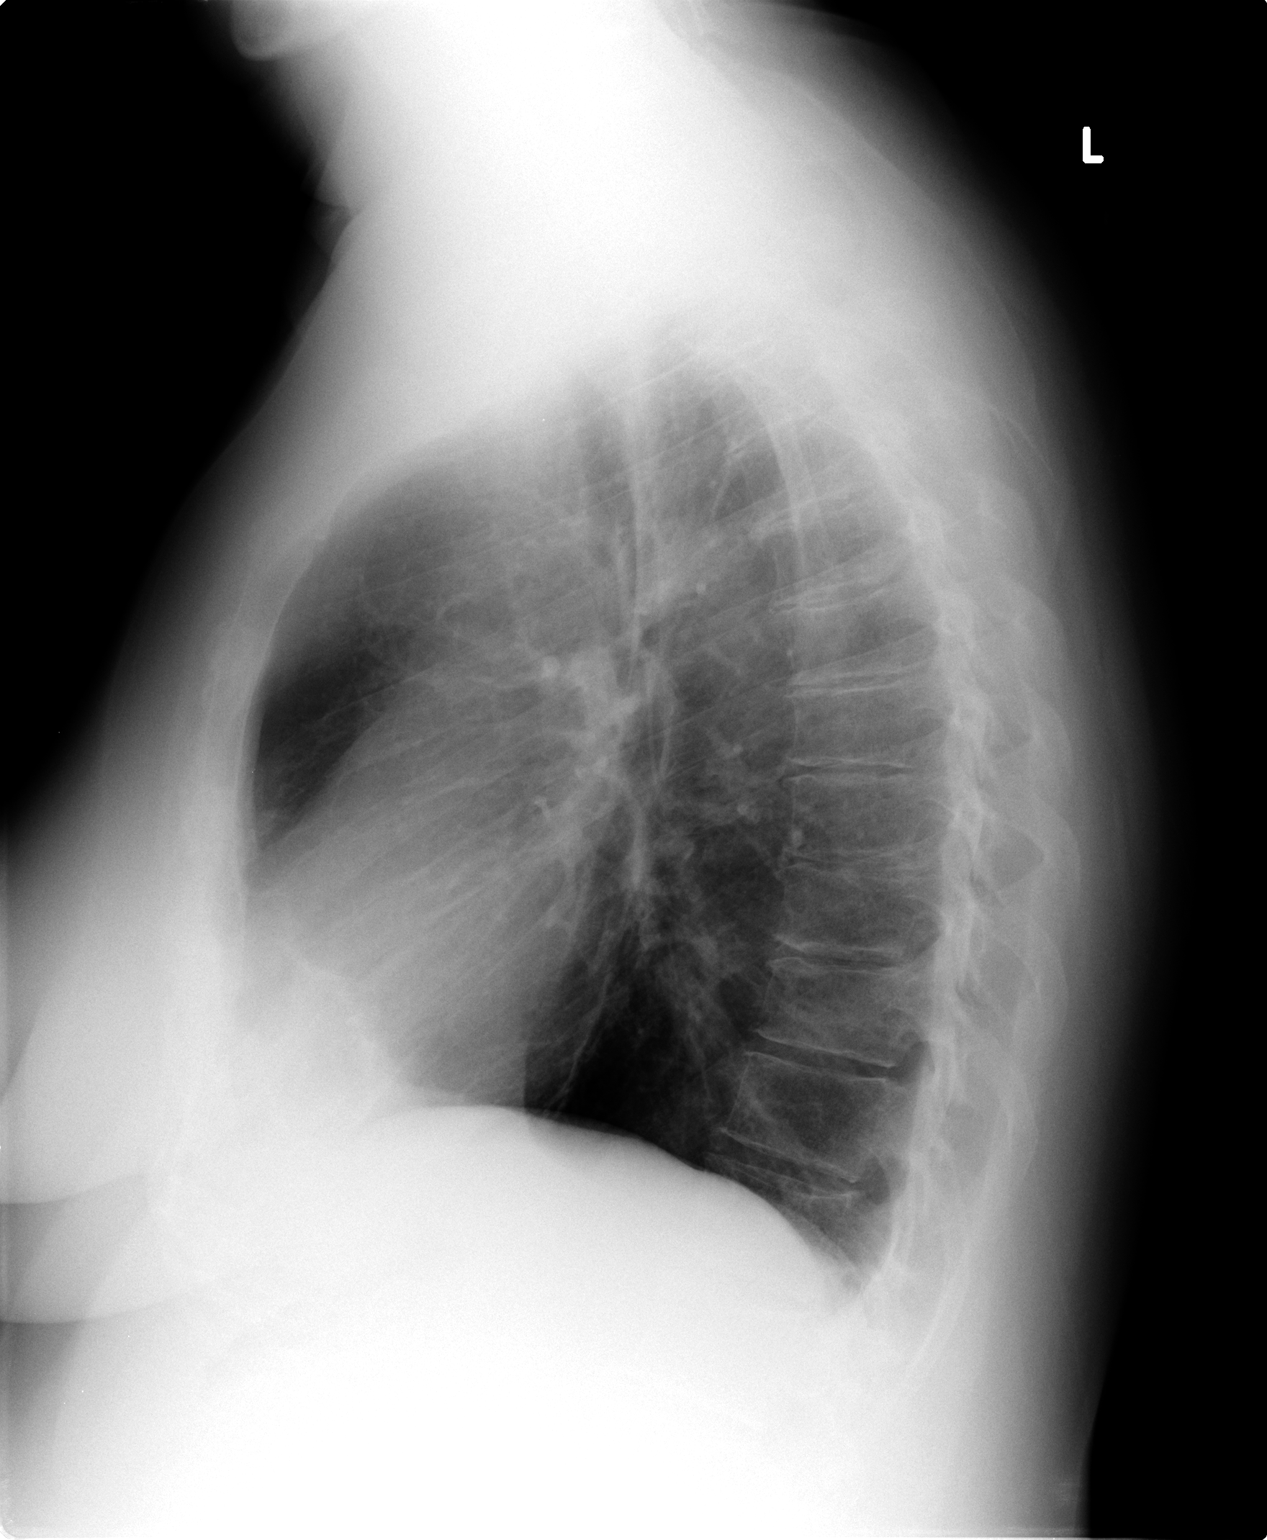

[2 of 2 positions shown; findings below may reference images not displayed]

FINDINGS: Cardiomediastinal silhouette is stable.  Mild
degenerative changes of thoracic spine again noted.  No acute
infiltrate or edema.  No pleural effusion.  Metallic fixation plate
cervical spine again noted.
IMPRESSION: No active disease.  No significant change.

## 2010-05-21 NOTE — Procedures (Signed)
[name], HORNSTEIN                 ACCOUNT NO.:  [id number]   MEDICAL RECORD NO.:  [id number]          PATIENT TYPE:  OUT   LOCATION:  RESP                          FACILITY:  [facility]   PHYSICIAN:  Edward L. Juanetta Gosling, M.D.DATE OF BIRTH:  [birth year]   DATE OF PROCEDURE:  12/22/2004  DATE OF DISCHARGE:                              PULMONARY FUNCTION TEST   RESULTS:  1.  Spirometry shows a mild ventilatory defect with evidence of airflow      obstruction.  2.  Lung volumes are normal with normal total lung capacity.  3.  The diffusing capacity, DLCO, is normal.  4.  There is no significant bronchodilator change.      Edward L. Juanetta Gosling, M.D.  Electronically Signed     ELH/MEDQ  D:  12/23/2004  T:  12/24/2004  Job:  [id number]

## 2010-05-21 NOTE — Discharge Summary (Signed)
NAMEGLENNA, BRUNKOW                 ACCOUNT NO.:  [id number]   MEDICAL RECORD NO.:  [id number]          PATIENT TYPE:  INP   LOCATION:  A326                          FACILITY:  [facility]   PHYSICIAN:  Corrie Mckusick, M.D.  DATE OF BIRTH:  1955/10/19   DATE OF ADMISSION:  07/16/2005  DATE OF DISCHARGE:  LH                                 DISCHARGE SUMMARY   DISCHARGE DIAGNOSIS:  Asthma exacerbation.   HISTORY OF PRESENT ILLNESS:  Please see admission H&P.   PAST MEDICAL HISTORY:  Please see admission H&P.   HOSPITAL COURSE:  The patient is a 55 year old female who responded  immediately with nebulized Xopenex as well as prednisone and was ready for  discharge the following day.  She felt remarkably better.  She is afebrile  and otherwise doing quite well.  We had a good discussion about he asthma  with her and her daughters.  We feel like we need to get her back allergy  tested and get her back on immunotherapy.  Also, I am going to replace  potassium as an outpatient and do a prednisone taper as a outpatient.   DISCHARGE PHYSICAL EXAMINATION:  VITAL SIGNS:  She is afebrile and vital  signs are stable.  CHEST:  Clear to auscultation bilaterally.  CARDIOVASCULAR:  Regular rate and rhythm.  No murmurs.   DISCHARGE MEDICATIONS:  Same as admission, with the addition of:  1.  Prednisone 50 to 10 mg taper over 15 days.  2.  Xopenex nebulizers q.i.d. up to five times a day.  3.  Potassium 20 mEq p.o. b.i.d.   Will also add on a hemoglobin A1C, as the patient's blood sugars were  elevated during the hospital stay, most likely due to prednisone.  We will  check this and continue to follow closely.      Corrie Mckusick, M.D.  Electronically Signed     JCG/MEDQ  D:  07/17/2005  T:  07/17/2005  Job:  [id number]

## 2010-05-21 NOTE — Op Note (Signed)
[name], [name]                 ACCOUNT NO.:  [id number]   MEDICAL RECORD NO.:  [id number]          PATIENT TYPE:  OUT   LOCATION:  DFTL                         FAC[facility]:  [facility]   PHYSICIAN:  Shan Levans, M.D. LHCDATE OF BIRTH:  [birth year]   DATE OF PROCEDURE:  01/26/2005  DATE OF DISCHARGE:                                 OPERATIVE REPORT   PROCEDURE:  Bronchoscopy.   CHIEF COMPLAINT:  Evaluate upper airway dysfunction.   SURGEON:  Shan Levans, M.D.   ANESTHESIA:  1% Xylocaine local.   PREOP MEDICATION:  Fentanyl 50 mcg, Versed 3 mg IV push.   DESCRIPTION OF PROCEDURE:  The Pentax video bronchoscope was introduced via  the right nares. The upper airways were visualized and showed excess  adenoidal tissue with extreme narrowing of the hypopharyngeal recess on  exploration. There was mild vocal cord dysfunction and mild retinoid edema  suggestive of reflux induced injury but not to a severe degree. The vocal  cords adducted and abducted properly. Attention was then paid to the lower  airway. The entire tracheobronchial tree was carefully examined. There were  no endobronchial lesions. There was very mild tracheobronchitis, however,  there were no excess secretions seen. The right middle lobe was widely  patent. No specimens were obtained.   IMPRESSION:  Mild vocal cord dysfunction syndrome, mild evidence of reflux  disease, significant adenoidal tissue with evidence of upper airway  resistance syndrome and also mild tracheobronchitis in combination.   RECOMMENDATIONS:  Will assess allergic profile. Will assess hypersensitivity  profile. Will followup CT scan of the sinuses, more aggressive treatment of  reflux disease and reassess airway management.      Shan Levans, M.D. [facility]  Electronically Signed     PW/MEDQ  D:  01/26/2005  T:  01/26/2005  Job:  [id number]   cc:   Kirk Ruths, M.D.  Fax: [phone]

## 2010-05-21 NOTE — Assessment & Plan Note (Signed)
Ms. Canady returns to clinic today for followup evaluation. She reports that  the prescription for the Bextra medication at 20 mg per day is helping her  significantly with her neck and low back pain. She does report that she  still has good and bad days, but the good are more frequent. She reports  that she has two refills on the Bextra at the present time, and those should  cover her through January of 2005.   During the last clinic visit, we did perform an injection of lidocaine and  Kenalog 40 into her left hip region. She does report that she gained  significant amount of relief with that and continues to have some relief to  a certain degree.   The patient has experienced no adverse side effects from the Bextra  medication. She denies any blood in her stools and any stomach upset.   MEDICATIONS:  1. Bextra 20 mg q.d.  2. Valium 5 mg q.d. p.r.n.  3. Hydrochlorothiazide 25 mg q.d.   PHYSICAL EXAMINATION:  GENERAL:  Well appearing, adult female.  VITAL SIGNS:  Blood pressure 125/82 with a pulse of 82 and O2 saturation 98%  on room air.  MUSCULOSKELETAL:  She has strength at 5/5 in the bilateral upper and lower  extremities. Bulk and tone were normal. Reflexes were 2+ and symmetrical.  Sensation was intact to light touch throughout the bilateral upper and lower  extremities.   IMPRESSION:  1. Chronic bursitis, left hip.  2. Minimal degenerative changes of the lumbar spine per MRI study.  3. History of cervical herniated nucleus pulposus x2, status post fusion.   At the present time, the patient reports that she has significant Bextra.  She is gaining good relief and experiencing no adverse side effects on this  medication. I will plan on seeing her in followup in approximately two  months' time.      Ellwood Dense, M.D.   DC/MedQ  D:  12/04/2002 13:49:41  T:  12/04/2002 14:22:41  Job #:  [id number]

## 2010-05-21 NOTE — Assessment & Plan Note (Signed)
[facility]                             PULMONARY OFFICE NOTE   NAME:[name], [name] PUERTA                        MRN:          [id number]  DATE:01/16/2006                            DOB:          04/03/1955    PROBLEM LIST:  1. Asthma.  2. Allergic rhinitis.  3. Sinus surgery with nasal polypectomy.  4. Allergic conjunctivitis.  5. Suspected reflux.   HISTORY:  She reports an outpatient pneumonia treated around Christmas  time with confirmation by chest x-ray. She still has a little cough and  wheeze now off of Avelox for about a week. She had kept building her  allergy vaccine with no problems and is about now up to maintenance  1:50, 0.5 mL per week. She denies symptoms of sinusitis, cough, wheeze  or sneeze other than with the pneumonia. She would like her daughter, a  Engineer, civil (consulting), to give her shots and I discussed this. We have reviewed risk  factors. She has an Epipen and understands how to use it. She is willing  to take the responsibility of getting her injections more conveniently  for her as long as we do not have problems. I discussed the availability  of Xolair in a very preliminary way.   MEDICATIONS:  1. HCTZ 25 mg.  2. Qvar 40 mg 3 puffs b.i.d.  3. Foradil 1 b.i.d.  4. Nasonex 2 puffs b.i.d.  5. Zegerid 40 mg.  6. Reglan 10 mg t.i.d.  7. Allergy vaccine.  8. Albuterol rescue inhaler.  9. Epipen.   Drug intolerant of Demerol, Singulair with rash.   OBJECTIVE:  Weight 165 pounds, blood pressure 118/70, pulse regular 68,  room air saturation 98%. Loosely congested cough and rattle bilaterally.  Work of breathing is not increased. There is no adenopathy, no stridor.  Heart sounds are regular without murmur.   IMPRESSION:  1. Allergic asthma.  2. Bronchitis.  3. Resolving pneumonia.   PLAN:  1. She will arrange to bring her daughter in so that the 2 can be      taught about allergy vaccine injection here and then with      appropriate  communication her daughter can give her shots at      home. They do have an Epipen and I went through our safety      precautions with them again.  2. Schedule return in 2 months, earlier p.r.n.     [name] D. Maple Hudson, MD, [facility], FACP  Electronically Signed    CDY/MedQ  DD: 01/16/2006  DT: 01/17/2006  Job #: [id number]   cc:   Patrica Duel, M.D.

## 2010-05-21 NOTE — Assessment & Plan Note (Signed)
[facility]                               PULMONARY OFFICE NOTE   NAME:[name], [name] COUILLARD                        MRN:          [id number]  DATE:09/21/2005                            DOB:          January 28, 1955    PROBLEM:  1. Asthma.  2. Seasonal allergic rhinitis.  3. Sinus surgery with nasal polypectomy.  4. Allergic conjunctivitis.  5. Suspected reflux.   HISTORY:  Comes today with her daughter, a Engineer, civil (consulting) at [facility], for allergy skin  testing.  She says she has been comfortable this week off of antihistamines.  She reminds me that Dr. Annalee [name] did sinus surgery early this spring.  She  still has had episodes of thick secretion from her nose and has needed some  antibiotics.  She says she gets tired out having to sit up repeatedly at  night because of wheezing.  She does not recognize reflux events, but I  talked with her again about this and reemphasized reflux precautions.   MEDICATION:  1. HCTZ 25 mg.  2. Qvar 40 three puffs b.i.d.  3. Foradil 1 b.i.d.  4. Nasonex 2 sprays each nostril b.i.d.  5. Zegerid 40 mg.  6. Reglan 10 mg t.i.d. before meals.  7. Rescue albuterol inhaler.   ALLERGIES:  Drug intolerant to DEMEROL with shortness of breath.  SINGULAIR  with rash, but not known drug intolerant to aspirin.   OBJECTIVE:  Weight 163 pounds.  Blood pressure 120/72.  Pulse regular 79.  Room air saturation 99%.  Light cough with no wheeze, unlabored, no dullness.  Voice quality is normal  and she is not obviously congested in the nose today.  HEART:  Sounds regular without murmur.   SKIN TESTS:  Positive histamine and negative diluent controls.  Positive  puncture and intradermal reactions particularly to grass, tree and weed  pollens and endodermals.   I reviewed the findings with her and her daughter.  We discussed  environmental and dust precautions.  They have a cat which will not go away  but I have emphasized keeping it out of the  bedroom.  We discussed treatment  options, emphasizing the potential importance of reflux and discussing the  role of allergy vaccine in similar situations.  We carefully discussed the  expectations and limitations of allergy vaccine, the potential for dangerous  reactions including anaphylaxis, policy concerning administration outside of  a medical office, epinephrine and options.  She wished to go ahead and start  on allergy vaccine feeling that she has been putting up with present  symptoms despite her active medication.   IMPRESSION:  1. Allergic rhinitis.  2. Asthma.  3. Probable esophageal reflux, not confirmed.   PLAN:  1. We will start vaccine here.  It may become possible for her to get her      shots at her physicians office in [place] or with the help of her      daughter who is registered nurse as long as circumstances are safe and      controlled.  She will begin by building here per  protocol.  2. Schedule return to me in 2 months, earlier p.r.n.                                   Clinton D. Maple Hudson, MD, FCCP, FACP   CDY/MedQ  DD:  09/21/2005  DT:  09/23/2005  Job #:  [id number]   cc:   Patrica Duel, M.D.  Corrie Mckusick, M.D.

## 2010-05-21 NOTE — Assessment & Plan Note (Signed)
[facility]                             PULMONARY OFFICE NOTE   NAME:[name] [name] MCILRATH                        MRN:          [id number]  DATE:04/13/2006                            DOB:          06-05-1955    [name] [name] is a 55 year old white female with a history of moderate to  severe persistent asthma and reflux disease, vocal cord dysfunction  syndrome, chronic sinusitis.  She has not been seen by me since August  2007, saw Dr. Maple Hudson in the fall.  She is now on allergy desensitization.  She has multiple positive allergies on skin testing.  She had a bout of  pneumonia before Christmas.  She has had increased asthma flares over  the last several weeks with the spring season.   MEDICATIONS:  The patient maintains-  1. Saline wash daily.  2. Allergy vaccine weekly.  3. Zegerid 40 mg daily.  4. Nasonex 2 sprays b.i.d.  5. __________  1 spray b.i.d.  6. Q-VAR 40 mcg, 3 sprays b.i.d.   ON EXAM:  VITAL SIGNS:  Temperature 97.9.  Blood pressure 126/80.  Pulse  72.  Saturation 95% on room air.  CHEST:  Showed distant breath sounds with a few expiratory wheezes.  CARDIAC EXAM:  Showed regular rate and rhythm without S3.  Normal S1,  S2.  ABDOMEN:  Soft, nontender.  EXTREMITIES:  Showed no edema or clubbing.  SKIN:  Clear.   IMPRESSION:  Moderate to severe persistent asthma with flare and  increased atopic factors.   PLAN:  The patient is to change Q-VAR to 80 mcg strength, 2 sprays  b.i.d.  The patient is to begin Nasacort 2 sprays each nostril daily,  Astelin 2 sprays each nostril b.i.d.  was prescribed.  No systemic  steroids are necessary.  We will see the patient back for return  followup in six weeks.     [name] Cradle Delford Field, MD, [facility]  Electronically Signed    PEW/MedQ  DD: 04/13/2006  DT: 04/13/2006  Job #: [id number]

## 2010-05-21 NOTE — Assessment & Plan Note (Signed)
[facility]                               PULMONARY OFFICE NOTE   NAME:Recinos, ARISSA FAGIN                        MRN:          [id number]  DATE:08/11/2005                            DOB:          10-19-1955    Ms. Burkle is a 55 year old white female, with a history of asthmatic  bronchitis, vocal cord dysfunction syndrome, reflux disease.  She had a bout  of acute asthma two weeks ago, now it is improved.  She has not used her  Albuterol inhaler recently, maintained Qvar three sprays b.i.d., 40 mcg  strength; Foradil 1 spray, b.i.d.; Nasonex 2 sprays each nostril, b.i.d.;  Zegerid 40 mg, daily; Reglan 10 mg, t.i.d. a.c.   EXAMINATION:  TEMPERATURE:     97.  BLOOD PRESSURE:  120/70.  PULSE:  71.  SATURATION:  96% room air.  CHEST:  Diminished breath sounds with prolonged respiratory phase.  No  wheeze or rhonchi.  CARDIAC:  Regular rate and rhythm without S3.  Normal S1-S2..  ABDOMEN:  Soft, non-tender.  EXTREMITIES:  No edema or clubbing.  SKIN:  Clear.  NEUROLOGIC:  Intact.  HEENT:  No jugular venous distention or lymphadenopathy.  OROPHARYNX:  Clear.  NECK:  Supple.  Spirometry was obtained today, was reviewed and showed normal spirometry.   IMPRESSION:  Stable mild intermittent asthma.  Vocal cord dysfunction  syndrome reflux disease.   PLAN:  Maintain inhaled medicines as currently dosed.  We will see the  patient back, return to followup in two months.                                   Charlcie Cradle Delford Field, MD, [facility]   PEW/MedQ  DD:  08/11/2005  DT:  08/11/2005  Job #:  [id number]   cc:   Patrica Duel, MD

## 2010-05-21 NOTE — Assessment & Plan Note (Signed)
[facility]                         GASTROENTEROLOGY OFFICE NOTE   NAME:Hollinshead, MARSELA KUAN                        MRN:          [id number]  DATE:08/31/2006                            DOB:          May 22, 1955    Bravo pH monitoring study.   I placed a Bravo pH wireless probe into patient's esophagus 6 cm above  the GE junction on August 31, 2006. This was done endoscopically with  confirmation of the probe in proper position following it's placement.  Interrogation of the device has shown that she does not have pathologic  amounts of acid refluxing into her esophagus while staying on Zegerid  once daily. Specifically her DeMeester score for day 1 was 0.7 with  normal less than 14.7. Her DeMeester score for day 2 was 0.3 again with  normal being 14.7 or less. She coughed multiple times throughout the day  but there was very little correlation between the cough and any small  amounts of acid that did reflux.   Ms. Southgate does not have pathological amounts of acid refluxing while  staying on Zegerid 40 mg once daily, so I think it is unlikely that GERD  is contributing to her chronic cough. Her classic GERD symptoms of  pyrosis are very well controlled on Zegerid once daily and she should  continue that indefinitely. I will communicate these results with her  directly.     Rachael Fee, MD  Electronically Signed    DPJ/MedQ  DD: 09/07/2006  DT: 09/07/2006  Job #: [id number]   cc:   Charlcie Cradle. Delford Field, MD, FCCP

## 2010-05-21 NOTE — Discharge Summary (Signed)
[name]                 ACCOUNT NO.:  [id number]   MEDICAL RECORD NO.:  [id number]          PATIENT TYPE:  INP   LOCATION:  1344                         FACILITY:  [facility]   PHYSICIAN:  Charlcie Cradle. Delford Field, MD, FCCPDATE OF BIRTH:  March 18, 1955   DATE OF ADMISSION:  02/12/2008  DATE OF DISCHARGE:  02/14/2008                               DISCHARGE SUMMARY   DISCHARGE DIAGNOSES:  1. Status asthmaticus.  2. Pansinusitis as a precipitating factor for status asthmaticus.   OPERATIONS AND PROCEDURES:  Known.   HISTORY OF PRESENT ILLNESS:  See history and physical on the chart.   HOSPITAL COURSE:  This patient was admitted with acute status  asthmaticus and was found to have on CT scan pansinusitis.  Upper GI was  unremarkable.  The patient was treated with appropriate antibiotic  steroids and nebulized therapy and improved and was ready for discharge  on February 14, 2008.   DISCHARGE MEDICATIONS:  Were to include:  1. Prednisone in a taper.  2. Omnicef 600 mg daily for 7 additional days.  3. Qvar was to be increased to 4 sprays b.i.d.   OTHER MAINTENANCE MEDICINES:  Would include:  1. Diovan 160 mg daily.  2. Foradil 1 spray b.i.d.  3. Nasonex 2 sprays daily.  4. Zegerid daily.  5. Astelin 2 sprays b.i.d.  6. Reglan 10 mg q.i.d.  7. Albuterol inhaler p.r.n.  8. Xolair 300 mg subcu every 4 weeks.   The patient was discharged in improved condition and will return to see  Rubye Oaks, nurse practitioner on February 21, 2008, and then see me,  Dr. Delford Field, on March 28, 2008.      Charlcie Cradle Delford Field, MD, [facility]  Electronically Signed     PEW/MEDQ  D:  03/05/2008  T:  03/05/2008  Job:  [id number]

## 2010-05-21 NOTE — Op Note (Signed)
[name], [name]                           ACCOUNT NO.:  [id number]   MEDICAL RECORD NO.:  [id number]                   PATIENT TYPE:  INP   LOCATION:  3012                                 FACILITY:  [facility]   PHYSICIAN:  Cristi Loron, M.D.            DATE OF BIRTH:  [birth year]   DATE OF PROCEDURE:  08/26/2002  DATE OF DISCHARGE:                                 OPERATIVE REPORT   PREOPERATIVE DIAGNOSIS:  C5-6 herniated nucleus pulposus, stenosis, cervical  radiculopathy, cervicalgia.   POSTOPERATIVE DIAGNOSIS:  C5-6 herniated nucleus pulposus, stenosis,  cervical radiculopathy, cervicalgia.   OPERATION PERFORMED:  C5-6 extensive anterior cervical diskectomy and  decompression; interbody iliac crest allograft arthrodesis, anterior  cervical plating (Codman titanium plate and screws); removal of plate at C6-  7.   SURGEON:  Cristi Loron, M.D.   ASSISTANT:  Danae Orleans. Venetia Maxon, M.D.   ANESTHESIA:  General endotracheal.   ESTIMATED BLOOD LOSS:  50mL.   SPECIMENS:  None.   DRAINS:  None.   COMPLICATIONS:  None.   INDICATIONS FOR PROCEDURE:  The patient is a 55 year old white female who I  performed a C6-7-8 anterior cervical diskectomy, fusion, and plating on  about five years ago.  She did very well and I had not heard much from her  and she developed neck pain, pain radiating down her left arm.  She was  worked up with a cervical MRI which demonstrated a herniated disk at C5-6 on  the left.  I discussed the various treatment options with her including  surgery.  The patient weighed the risks, benefits and alternatives to  surgery and decided to proceed with a C5-6 anterior cervical diskectomy,  fusion and plating and removal of the plate at Z6-1.   DESCRIPTION OF PROCEDURE:  The patient was brought to the operating room by  the anesthesia team.  General endotracheal anesthesia was induced.  The  patient remained in the supine position.  A roll was placed under  her  shoulders to place her neck in slight extension.  Her anterior cervical  region was then prepared with Betadine scrub and Betadine solution.  Sterile  drapes were applied.  I then injected the area to be incised with Marcaine  with epinephrine solution.  I used a scalpel to incise through the patient's  previous surgical scar.  I then used the Metzenbaum scissors to divide the  platysma muscle and then dissect medial to the sternocleidomastoid muscle,  jugular vein and carotid artery.  I bluntly dissected down towards the  anterior cervical spine carefully identifying the esophagus and retracting  it medially.  We used the Kitner swabs to clear the soft tissue from the  anterior cervical spine and then to expose the previous plate at W9-6.  We  then unlocked the cams on the plate, removed the screws and then removed the  plate.  We then used electrocautery  to detach the medial border of the  longus colli muscle bilaterally from the C5-6 intervertebral disk space,  inserted the Caspar self-retaining retractor for exposure.  We then incised  the C5-6 intervertebral disk and performed a partial diskectomy using a  pituitary forceps and the Epstein curets.  We inserted the distraction  screws at C5 and 6, distracted the interspace and then used the high speed  drill to decorticate the vertebral end plates at E4-5 and drill away the  remainder of the C5-6 intervertebral disk as well as thin out the posterior  longitudinal ligament.  We then incised the ligament with the arachnoid  knife and then removed it with the Kerrison punch undercutting the vertebral  end plates at W0-9 decompressing the thecal sac.  We performed a  foraminotomy about the bilateral C6 nerve roots.  Of note, there was a soft  disk herniation on the left compressing the left C6 nerve root.   Having completed the decompression, we now turned our attention to the  arthrodesis.  We obtained iliac crest tricortical  allograft bone graft and  fashioned it to these approximate dimensions.  6 mm in height, 1 cm in  depth.  We inserted the bone graft and distracted the interspace and then  removed the distraction screws.  There was good snug fit of the bone graft.   We then turned our attention to anterior spinal instrumentation.  We  obtained the appropriate length Codman anterior cervical plate, laid it  along the anterior aspect of the vertebral body of C5 to C6.  We placed two  12 mm screws in the previous holes at C6.  We drilled two new holes at C5,  tapped the holes and then secured the plates at the C5 vertebral body by  placing two 12 mm screws.  We then obtained intraoperative radiograph that  demonstrated good position of the plates, screws and interbody graft.  We  then secured the screws to the plate by locking each cam.  We then achieved  stringent hemostasis using bipolar electrocautery.  We then removed the  Caspar self-retaining retractor and then inspected the esophagus for any  damage.  There was none apparent.  We then reapproximated the patient's  platysma muscle with interrupted 3-0 Vicryl suture, the subcutaneous tissue  with interrupted 3-0 Vicryl suture and the skin with Steri-Strips and the  skin with Steri-Strips and benzoin.  The wound was then coated with  bacitracin ointment and sterile dressings applied.  The drapes were removed  and the patient was subsequently extubated by the anesthesia team and  transported to the post anesthesia care unit in stable condition.  Sponge  and instrument counts were correct at the end of this case.                                                Cristi Loron, M.D.    JDJ/MEDQ  D:  08/26/2002  T:  08/27/2002  Job:  [id number]

## 2010-05-21 NOTE — Assessment & Plan Note (Signed)
[facility]                               PULMONARY OFFICE NOTE   NAME:[name], [name]                        MRN:          [id number]  DATE:08/16/2005                            DOB:          May 16, 1955    PULMONARY ALLERGY CONSULTATION:  Pulmonary allergy consultation at the kind  request of Dr. Nobie Putnam for this 55 year old never smoker with concern of  atopic allergy.   HISTORY:  She has been followed at this office since January 2007 by Dr.  Delford Field for asthma and bronchitis, marked by wheeze and cough, especially at  night.  She had undergone a bronchoscopy.  Pulmonary function testing at  [facility] had demonstrated an FEV1 of 1.70, FVC 2.46, FEV1/FVC ratio of  69%, interpreted as moderate air flow obstruction especially with small  airway reduction and a normal diffusion.  She says asthma-type complaints  began as a progressive cough syndrome in September 2006.  She had not  recognized anything similar prior to that and does not think she caught a  cold.  She did have some history of seasonal watering eyes and nose, usually  treated over-the-counter.  She had to be hospitalized overnight at [facility] with an exacerbation of asthma after sitting out on the porch  around 5 p.m.  That complaint completely resolved, but a week later she  found herself developing a diffuse pruritic urticaria.  She took two  Benadryl and the rash faded over 3-4 hours but recurred 3 or 4 times across  the weekend.  We note that it was only on the weekend after she had been at  the hospital a week before.  She is not quite clear what medications she may  have been continuing as she left the hospital, but she is back to her  routine medications now.  At age 30 she had been on allergy vaccine for  rhinorrhea and cough symptoms.  She has not had a previous history of  urticarial reactions, unusual reaction to insects to foods or other similar  triggers.   REVIEW OF SYSTEMS:  Today she feels quite well.  Some degree of exertional  dyspnea, not clearly out of line for her degree of fitness.  Productive  cough.  Acid indigestion.  Weight has been drifting upwards.  Reflux  symptoms have been prevented by medication.  She had had sinus surgery by  Dr. Annalee Genta in May and says sense of smell had been poor since before that  surgery.  If she reaches under a bed to get anything, the head-down position  results in mucus running out of my head.[name] Kitchen   ENVIRONMENTAL/TRIGGER:  The main respiratory irritant she notices is tobacco  smoke, which she avoids.  She does not recognize any particular problems  with being around animals, house dust or molds to the extent that her  exposure occurs.  She lives in a house with central air conditioning, half  basement, one cat, no significant mold or mildew concerns.   PAST HISTORY:  1. Allergy skin testing and vaccine for 4 years at  age 72 as noted.  2. Sinus surgery in May 2007 by Dr. Annalee Genta.  3. Esophageal reflux.  4. Hypertension.  5. Asthma.  6. Borderline diabetes.  7. Neck/back surgery.  8. Hysterectomy.  9. Breast lump.  10.No history of tuberculosis exposure.  11.No unusual reactions to insects, foods, etc.  12.No problems with latex.   MEDICATIONS:  1. Hydrochlorothiazide 25 mg.  2. Qvar 40 mcg three puffs b.i.d.  3. Foradil one capsule b.i.d.  4. Nasonex two sprays each nostril b.i.d.  5. Zegerid 40 mg.  6. Reglan 10 mg t.i.d.  7. Rescue albuterol inhaler used if needed.   Drug intolerance to Demerol, which slows her breathing, and Singulair, she  says, causes rash.   SOCIAL HISTORY:  Never smoked.  Divorced, grown children.  Lives alone with  significant other.  Works as a Haematologist.   FAMILY HISTORY:  Father had seasonal pollen allergy.  Mother had emphysema  and asthma.   RADIOLOGY:  A single-view portable chest film from July 16, 2005, is  reported as showing COPD with no acute  cardiopulmonary disease.  It mentions  a lucency under the left hemidiaphragm, likely within the stomach, which  would indicate a gastric bubble on single-view film.   REVIEW OF RECORDS:  In addition to above, review of available notes from Dr.  Delford Field indicates a diagnosis of a component including vocal cord dysfunction  and a significant reflux.   OBJECTIVE:  VITAL SIGNS:  Weight 177 pounds, BP 126/68, pulse regular at 64,  room air saturation 95%.  GENERAL:  This is a comfortable-appearing woman.  SKIN:  No evident rash.  ADENOPATHY:  None found at the neck, shoulders or axillae.  HEENT:  Conjunctivae are clear.  There is a thick, gluey mucus, right  greater than left nostril, without periorbital edema or visible polyps now  (note that she says Dr. Annalee Genta removed polyps).  There is no pharyngeal  erythema or evidence of postnasal drainage, and voice quality is normal.  NECK:  No neck vein distention or stridor.  CHEST:  Quiet, clear lung fields without any suggestion of cough, wheeze,  dullness or increased work of breathing.  CARDIAC:  Heart sounds regular without murmur or gallop.  ABDOMEN:  No enlargement of liver or spleen.  EXTREMITIES:  No cyanosis, clubbing or edema.   IMPRESSION:  1. Adult-onset asthma or asthmatic bronchitis with components recognized      at least to some extent from reflux and from vocal cord dysfunction.  2. Seasonal allergic rhinitis.  3. Significant retained nasal secretions, status post sinus surgery,      including nasal polypectomy.  4. Requirement for nasal polypectomy together with asthma would suggest      the possibility that she has an aspirin sensitivity triad, but she does      not offer history of that sort of a reaction.  5. Allergic conjunctivitis.  6. Single cluster of urticarial events in one weekend, one week after     hospitalization.  The possibility should be considered that this was an      urticarial reaction to some drug  or exposure associated with her      hospital stay.  Hopefully, it will not recur.  7. Suspected esophageal reflux.   PLAN:  1. Continue aggressive saline lavage as instructed.  2. Continue present medications.  3. Postural drainage.  4. We are scheduling return for allergy skin testing.   I appreciate the chance to meet  her and would be happy to discuss her care.                                   Clinton D. Maple Hudson, MD, [facility], FACP   CDY/MedQ  DD:  08/16/2005  DT:  08/17/2005  Job #:  [id number]   cc:   Patrica Duel, MD  Corrie Mckusick, MD

## 2010-05-21 NOTE — Assessment & Plan Note (Signed)
[facility]                               PULMONARY OFFICE NOTE   NAME:[name], [name]                        MRN:          [id number]  DATE:11/02/2005                            DOB:          [birth year]    PROBLEMS:  1. Asthma.  2. Allergic rhinitis.  3. Sinus surgery with nasal polypectomy.  4. Allergic conjunctivitis.  5. Suspected reflux.   HISTORY:  She is building up allergy vaccine here but would like Dr.  Nobie Putnam to give it at his office for convenience.  We reviewed vaccine risk  and goals, issues of administration outside of a medical office, anaphylaxis  and epinephrine.  She says she feels fine today.   MEDICATION:  1. Hydrochlorothiazide 25 mg.  2. QVAR 40 mg three puffs b.i.d.  3. Foradil one b.i.d.  4. Nasonex two sprays each nostril b.i.d.  5. Zegerid 40 mg.  6. Reglan 10 mg t.i.d.  7. Allergy vaccine.  8. Albuterol rescue inhaler.  9. EpiPen.   ALLERGIES:  DRUG INTOLERANCE OF DEMEROL WITH SHORTNESS OF BREATH, SINGULAIR  SAID TO CAUSE RASH.   OBJECTIVE:  VITAL SIGNS:  Weight 167 pounds.  BP 102/64, pulse regular 73,  room air saturation 95%.  RESPIRATORY:  There is mild bilateral end-expiratory wheeze but it is  unlabored with no stridor.  CARDIOVASCULAR:  Heart sounds are regular without murmur or gallop.  There  is no edema.  ENT:  Her nasal airway seems clear.  Pharynx is clear.   IMPRESSION:  Rhinitis and asthma with somewhat more asthma I think than she  realizes currently.  I still suspect a component of reflux and we discussed  that again.  She does not recognize I think that she wheezes routinely and  we discussed her pulmonary function tests from January 2007 which had shown  mild obstructive airways disease.  She may be a Xolair candidate.   PLAN:  1. She will be able to transfer her allergy vaccine administration to Dr.      Geanie Logan office.  2. Flu and pneumococcal vaccines were given.  3. Schedule  return 2 months, earlier p.r.n.     Clinton D. Maple Hudson, MD, [facility], FACP  Electronically Signed    CDY/MedQ  DD: 11/04/2005  DT: 11/05/2005  Job #: [id number]

## 2010-05-21 NOTE — H&P (Signed)
[name], [name]                 ACCOUNT NO.:  [id number]   MEDICAL RECORD NO.:  [id number]          PATIENT TYPE:  INP   LOCATION:  A326                          FACILITY:  [facility]   PHYSICIAN:  Corrie Mckusick, M.D.  DATE OF BIRTH:  14-May-1955   DATE OF ADMISSION:  07/16/2005  DATE OF DISCHARGE:  LH                                HISTORY & PHYSICAL   ADMISSION DIAGNOSIS:  Asthma exacerbation.   HISTORY OF PRESENT ILLNESS:  This is a 55 year old female with recent  diagnosis of significant asthma who presents with 12 hours of worsening  shortness of breath and wheezing.  She came into the emergency department in  mild distress and was found to be actively wheezing.  She seemed to improve  greatly after albuterol nebs as well as prednisone.  The patient has been  closely followed by Dr. Delford Field, pulmonologist, and had recent bronchoscopy  in January of 2007.  He has basically diagnosed her as having significant  asthma as well as reflux.  He has her on PPIs as well as a host of other  pulmonary medications.  He also had her set up with ENT, as she had had some  chronic sinusitis.  At the end of May, she just had sinus surgery, with  great improvement there.   She also had pulmonary function tests done at the end of December by Dr.  Juanetta Gosling which showed some mild asthma changes.  Again, the patient had been  doing remarkably well until the day of admission.  No fevers, chills, or  other complaints.  Really no sick contacts.  No active cough or other  symptomatology.   PAST MEDICAL HISTORY:  1.  Asthma.  2.  GERD.   SOCIAL HISTORY:  She does not drink or smoke.  She has a very supportive  family.   FAMILY HISTORY:  Noncontributory.   MEDICATIONS ON ADMISSION:  1.  Hydrochlorothiazide 25 mg daily.  2.  Reglan 10 mg p.o. daily.  3.  Zegerid 40 mg p.o. daily.  4.  Foradil t.i.d..  5.  Xopenex nebulizers q.8h.   ALLERGIES:  SINGULAIR AND DEMEROL.   PHYSICAL EXAMINATION:   VITAL SIGNS:  Temperature 98.0, pulse 90,  respirations 18, blood pressure 120/75, O2 saturation 97% on room air.  GENERAL:  When I saw her she was pleasant, talkative, joking, and in no  acute distress.  HEENT:  Normocephalic, atraumatic.  Nasopharynx clear.  NECK:  Supple.  CHEST:  Clear to auscultation bilaterally with just some scant end-  expiratory wheezes.  No areas or rhonchi or rales.  CARDIOVASCULAR:  Regular rate and rhythm.  Normal S1 and S2.  No murmurs.  ABDOMEN:  Soft.  EXTREMITIES:  No edema.   LABORATORY DATA:  Sodium 139, potassium low at 3.0, glucose elevated at 190,  BUN 11, creatinine 1.3.  White count 13.3, hematocrit 31.4, platelets 304.  D-dimer 0.41.   Chest x-ray reported no infiltrates per the emergency room physician.   ASSESSMENT:  The patient is a 55 year old female with gastroesophageal  reflux disease and asthma with  asthma exacerbation.   PLAN:  1.  Admit for close monitoring and aggressive nebulizer treatment.  2.  Prednisone p.o.  3.  Will continue to follow closely and replace potassium.      Corrie Mckusick, M.D.  Electronically Signed     JCG/MEDQ  D:  07/17/2005  T:  07/17/2005  Job:  [id number]

## 2010-05-23 ENCOUNTER — Inpatient Hospital Stay (HOSPITAL_COMMUNITY)
Admission: EM | Admit: 2010-05-23 | Discharge: 2010-05-26 | DRG: 543 | Disposition: A | Discharge status: Home / Self Care | Payer: BC Managed Care – PPO | Attending: Internal Medicine | Admitting: Internal Medicine

## 2010-05-23 ENCOUNTER — Emergency Department (HOSPITAL_COMMUNITY): Payer: BC Managed Care – PPO

## 2010-05-23 DIAGNOSIS — C8589 Other specified types of non-Hodgkin lymphoma, extranodal and solid organ sites: Secondary | ICD-10-CM | POA: Diagnosis present

## 2010-05-23 DIAGNOSIS — E119 Type 2 diabetes mellitus without complications: Secondary | ICD-10-CM | POA: Diagnosis present

## 2010-05-23 DIAGNOSIS — J45909 Unspecified asthma, uncomplicated: Secondary | ICD-10-CM | POA: Diagnosis present

## 2010-05-23 DIAGNOSIS — IMO0002 Reserved for concepts with insufficient information to code with codable children: Secondary | ICD-10-CM

## 2010-05-23 DIAGNOSIS — I871 Compression of vein: Principal | ICD-10-CM | POA: Diagnosis present

## 2010-05-23 DIAGNOSIS — E785 Hyperlipidemia, unspecified: Secondary | ICD-10-CM | POA: Diagnosis present

## 2010-05-23 DIAGNOSIS — Z7901 Long term (current) use of anticoagulants: Secondary | ICD-10-CM

## 2010-05-23 DIAGNOSIS — K219 Gastro-esophageal reflux disease without esophagitis: Secondary | ICD-10-CM | POA: Diagnosis present

## 2010-05-23 DIAGNOSIS — Z86718 Personal history of other venous thrombosis and embolism: Secondary | ICD-10-CM

## 2010-05-23 DIAGNOSIS — F3289 Other specified depressive episodes: Secondary | ICD-10-CM | POA: Diagnosis present

## 2010-05-23 DIAGNOSIS — I10 Essential (primary) hypertension: Secondary | ICD-10-CM | POA: Diagnosis present

## 2010-05-23 DIAGNOSIS — N179 Acute kidney failure, unspecified: Secondary | ICD-10-CM | POA: Diagnosis present

## 2010-05-23 DIAGNOSIS — F329 Major depressive disorder, single episode, unspecified: Secondary | ICD-10-CM | POA: Diagnosis present

## 2010-05-23 LAB — DIFFERENTIAL
Basophils Absolute: 0 10*3/uL (ref 0.0–0.1)
Basophils Relative: 0 % (ref 0–1)
Eosinophils Absolute: 0.3 10*3/uL (ref 0.0–0.7)
Neutro Abs: 7.8 10*3/uL — ABNORMAL HIGH (ref 1.7–7.7)
Neutrophils Relative %: 80 % — ABNORMAL HIGH (ref 43–77)

## 2010-05-23 LAB — POCT I-STAT, CHEM 8
BUN: 20 mg/dL (ref 6–23)
Calcium, Ion: 1.18 mmol/L (ref 1.12–1.32)
HCT: 37 % (ref 36.0–46.0)
Hemoglobin: 12.6 g/dL (ref 12.0–15.0)
Sodium: 139 mEq/L (ref 135–145)
TCO2: 30 mmol/L (ref 0–100)

## 2010-05-23 LAB — CBC
Hemoglobin: 13.3 g/dL (ref 12.0–15.0)
MCH: 32.3 pg (ref 26.0–34.0)
Platelets: 130 10*3/uL — ABNORMAL LOW (ref 150–400)
RBC: 4.12 MIL/uL (ref 3.87–5.11)
WBC: 9.7 10*3/uL (ref 4.0–10.5)

## 2010-05-23 LAB — PROTIME-INR
INR: 1.2 (ref 0.00–1.49)
Prothrombin Time: 15.4 seconds — ABNORMAL HIGH (ref 11.6–15.2)

## 2010-05-23 MED ORDER — IOHEXOL 300 MG/ML  SOLN
100.0000 mL | Freq: Once | INTRAMUSCULAR | Status: AC | PRN
  Administered 2010-05-23: 100 mL via INTRAVENOUS

## 2010-05-24 ENCOUNTER — Telehealth: Payer: Self-pay | Admitting: Critical Care Medicine

## 2010-05-24 LAB — CBC
Hemoglobin: 12.3 g/dL (ref 12.0–15.0)
MCHC: 33.2 g/dL (ref 30.0–36.0)
RDW: 14 % (ref 11.5–15.5)
WBC: 7.5 10*3/uL (ref 4.0–10.5)

## 2010-05-24 LAB — APTT: aPTT: 23 seconds — ABNORMAL LOW (ref 24–37)

## 2010-05-24 LAB — MAGNESIUM: Magnesium: 1.9 mg/dL (ref 1.5–2.5)

## 2010-05-24 LAB — DIFFERENTIAL
Basophils Absolute: 0 10*3/uL (ref 0.0–0.1)
Basophils Relative: 0 % (ref 0–1)
Lymphocytes Relative: 6 % — ABNORMAL LOW (ref 12–46)
Monocytes Absolute: 0.1 10*3/uL (ref 0.1–1.0)
Neutro Abs: 7 10*3/uL (ref 1.7–7.7)

## 2010-05-24 LAB — COMPREHENSIVE METABOLIC PANEL
Albumin: 3.5 g/dL (ref 3.5–5.2)
BUN: 18 mg/dL (ref 6–23)
Calcium: 10.1 mg/dL (ref 8.4–10.5)
Glucose, Bld: 211 mg/dL — ABNORMAL HIGH (ref 70–99)
Sodium: 137 mEq/L (ref 135–145)
Total Protein: 6.7 g/dL (ref 6.0–8.3)

## 2010-05-24 LAB — PROTIME-INR
INR: 1.23 (ref 0.00–1.49)
Prothrombin Time: 15.7 seconds — ABNORMAL HIGH (ref 11.6–15.2)

## 2010-05-24 LAB — MRSA PCR SCREENING: MRSA by PCR: NEGATIVE

## 2010-05-24 NOTE — Telephone Encounter (Signed)
PW not in the office this wk- tried calling pt and NA and no option to leave a msg. WCB.

## 2010-05-25 LAB — CBC
HCT: 33.4 % — ABNORMAL LOW (ref 36.0–46.0)
MCHC: 33.2 g/dL (ref 30.0–36.0)
RDW: 14 % (ref 11.5–15.5)

## 2010-05-25 LAB — BASIC METABOLIC PANEL
BUN: 25 mg/dL — ABNORMAL HIGH (ref 6–23)
Calcium: 9.6 mg/dL (ref 8.4–10.5)
GFR calc non Af Amer: 52 mL/min — ABNORMAL LOW (ref >60)
Glucose, Bld: 150 mg/dL — ABNORMAL HIGH (ref 70–99)
Sodium: 140 mEq/L (ref 135–145)

## 2010-05-25 NOTE — Telephone Encounter (Signed)
Spoke with pt. She wanted PW to know that she is at [facility] room 2609 and is expected to be sent home tomorrow. Pt aware PW out of the office this wk and that I am forwarding him this msg.

## 2010-05-26 LAB — PROTIME-INR
INR: 1.36 (ref 0.00–1.49)
Prothrombin Time: 17 seconds — ABNORMAL HIGH (ref 11.6–15.2)

## 2010-05-26 LAB — BASIC METABOLIC PANEL
BUN: 25 mg/dL — ABNORMAL HIGH (ref 6–23)
Creatinine, Ser: 1.03 mg/dL (ref 0.4–1.2)
GFR calc non Af Amer: 56 mL/min — ABNORMAL LOW (ref >60)

## 2010-05-26 LAB — CBC
MCHC: 33.1 g/dL (ref 30.0–36.0)
RDW: 14.4 % (ref 11.5–15.5)

## 2010-05-26 NOTE — Telephone Encounter (Signed)
Noted, will try to round on her today

## 2010-06-01 ENCOUNTER — Encounter (HOSPITAL_COMMUNITY): Payer: BC Managed Care – PPO | Admitting: Oncology

## 2010-06-01 DIAGNOSIS — C8589 Other specified types of non-Hodgkin lymphoma, extranodal and solid organ sites: Secondary | ICD-10-CM

## 2010-06-01 NOTE — H&P (Signed)
[name], [name]                 ACCOUNT NO.:  [id number]  MEDICAL RECORD NO.:  [id number]           PATIENT TYPE:  I  LOCATION:  4742                         FACILITY:  [facility]  PHYSICIAN:  Kathlen Mody, MD       DATE OF BIRTH:  [birth year]  DATE OF ADMISSION:  05/23/2010 DATE OF DISCHARGE:                             HISTORY & PHYSICAL   PRIMARY CARE PHYSICIAN:  Kirk Ruths, MD  ONCOLOGIST:  Ladona Horns. Neijstrom, MD  CHIEF COMPLAINT:  Swelling of the face and neck since 1 week.  HISTORY OF PRESENT ILLNESS:  This is a 55 year old lady with past medical history of diffuse large B-cell lymphoma, diagnosed in July 2011, status post four cycles of chemo and three cycles of chemo were complicated by neutropenic fever and pseudomonas sepsis.  She has finished her recent chemotherapy in March 2012 with Rituxan and has been complaining of on and off facial swelling with neck swelling since January 2012 which has worsened over the last 1 week associated with some headache, congestion in her eyes.  She also reports complaining of dizziness when she bends to pick up things.  Denies any chest pain, trouble breathing, cough, or fevers.  She denies any abdominal pain, nausea, vomiting, diarrhea, or urinary complaints.  She does complain of headaches since 1 week, worsened over the last two days, associated with the worsening of this facial and neck swelling.  She also reports of feeling congested in the face and neck and purple discoloration of the face, neck, and hands.  No blurry vision.  Denies any focal weakness. Is also complaining of some tingling sensation in her lower extremities occasionally.  REVIEW OF SYSTEMS:  See HPI, otherwise negative.  PAST MEDICAL HISTORY:  Stage IV diffuse B-cell lymphoma status post four cycles of chemo, the first three cycles were with R-CHOP, the fourth cycle was just with Rituxan, last chemo was in March 2012.  She has type 2 diabetes, asthma,  hypertension, hyperlipidemia, and GERD.  PAST SURGICAL HISTORY:  Hysterectomy.  FAMILY HISTORY:  Coronary artery disease.  SOCIAL HISTORY:  Lives at home.  Denies EtOH, smoking, or IV drug abuse.  HOME MEDICATIONS:  See detailed med record for detailed medications and doses.  ALLERGIES:  She is allergic to [facility] and DEMEROL.  PHYSICAL EXAMINATION:  VITAL SIGNS:  She has a temperature of 98.4, pulse of 82, respirations 20, blood pressure 143/73, saturating 95% on room air. GENERAL:  She is alert, afebrile, oriented x3, comfortable in no acute distress. HEENT AND NECK:  Swelling of the face and neck with some congestion in the eyes and flushed face and neck.  JVD cannot be appreciated.  Pupils reacting to light.  Moist mucous membranes. CARDIOVASCULAR:  S1 and S2 heard. RESPIRATORY:  Good air entry bilateral. ABDOMEN:  Soft, nontender, nondistended.  Bowel sounds are heard. EXTREMITIES:  No pedal edema. NEUROLOGIC:  Nonfocal.  PERTINENT LABORATORY DATA:  She had a CBC which is significant for a platelet count of 130.  Chem-8 shows hemoglobin of 12.6, hematocrit of 37, BUN of 20, creatinine of 1.4, glucose of 108,  chloride of 102, potassium 4.6, sodium of 139, bicarb of 30, INR of 1.2.  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  She also has a right-sided Port-A-Cath tip which is the SVC, no pneumonia.  ASSESSMENT AND PLAN: 1. This is a 55 year old lady with a history of large B-cell lymphoma     status post four cycles of chemo, last chemo in March 2012,     admitted for superior vena cava syndrome with complaints of facial     swelling, congestion, and neck swelling.  The patient will be     treated symptomatically with  IV steroids and pain medication.  CT     Surgery consult was called from Dr. Cornelius Moras who recommended no     surgical intervention emergent at this time and recommended to call     Dr. Edwyna Shell in the morning at office and see if she needs any     surgical intervention with possible stent placement. 2. Asthma.  Albuterol nebs as needed. 3. Hypertension, controlled. 4. Deep venous thrombosis.  The patient is on Coumadin, continue with     Coumadin as per pharmacy. 5. Depression.  She is on Celexa 20 mg daily.  Continue with the same. 6. Gastroesophageal reflux disease.  Continue with proton pump     inhibitor. 7. Hyperlipidemia.  Continue with simvastatin.  The patient is full     code.          ______________________________ Kathlen Mody, MD     VA/MEDQ  D:  05/23/2010  T:  05/23/2010  Job:  [id number]  Electronically Signed by Kathlen Mody MD on 06/01/2010 01:23:22 AM

## 2010-06-07 ENCOUNTER — Emergency Department (HOSPITAL_COMMUNITY): Payer: BC Managed Care – PPO

## 2010-06-07 ENCOUNTER — Inpatient Hospital Stay (HOSPITAL_COMMUNITY)
Admission: EM | Admit: 2010-06-07 | Discharge: 2010-06-11 | DRG: 812 | Disposition: A | Discharge status: Home / Self Care | Payer: BC Managed Care – PPO | Attending: Internal Medicine | Admitting: Internal Medicine

## 2010-06-07 DIAGNOSIS — J40 Bronchitis, not specified as acute or chronic: Secondary | ICD-10-CM | POA: Diagnosis present

## 2010-06-07 DIAGNOSIS — M4712 Other spondylosis with myelopathy, cervical region: Secondary | ICD-10-CM | POA: Diagnosis present

## 2010-06-07 DIAGNOSIS — J45909 Unspecified asthma, uncomplicated: Secondary | ICD-10-CM | POA: Diagnosis present

## 2010-06-07 DIAGNOSIS — Z87898 Personal history of other specified conditions: Secondary | ICD-10-CM

## 2010-06-07 DIAGNOSIS — Z7901 Long term (current) use of anticoagulants: Secondary | ICD-10-CM

## 2010-06-07 DIAGNOSIS — Z888 Allergy status to other drugs, medicaments and biological substances status: Secondary | ICD-10-CM

## 2010-06-07 DIAGNOSIS — R042 Hemoptysis: Secondary | ICD-10-CM | POA: Diagnosis present

## 2010-06-07 DIAGNOSIS — J329 Chronic sinusitis, unspecified: Secondary | ICD-10-CM | POA: Diagnosis present

## 2010-06-07 DIAGNOSIS — Z86718 Personal history of other venous thrombosis and embolism: Secondary | ICD-10-CM

## 2010-06-07 DIAGNOSIS — D649 Anemia, unspecified: Secondary | ICD-10-CM | POA: Diagnosis present

## 2010-06-07 DIAGNOSIS — I82290 Acute embolism and thrombosis of other thoracic veins: Secondary | ICD-10-CM | POA: Diagnosis present

## 2010-06-07 DIAGNOSIS — T82898A Other specified complication of vascular prosthetic devices, implants and grafts, initial encounter: Principal | ICD-10-CM | POA: Diagnosis present

## 2010-06-07 LAB — DIFFERENTIAL
Basophils Relative: 0 % (ref 0–1)
Eosinophils Absolute: 0.1 10*3/uL (ref 0.0–0.7)
Neutrophils Relative %: 77 % (ref 43–77)

## 2010-06-07 LAB — CBC
MCH: 30.9 pg (ref 26.0–34.0)
Platelets: 164 10*3/uL (ref 150–400)
RBC: 3.24 MIL/uL — ABNORMAL LOW (ref 3.87–5.11)
WBC: 6.1 10*3/uL (ref 4.0–10.5)

## 2010-06-07 LAB — PRO B NATRIURETIC PEPTIDE: Pro B Natriuretic peptide (BNP): 229.6 pg/mL — ABNORMAL HIGH (ref 0–125)

## 2010-06-07 LAB — COMPREHENSIVE METABOLIC PANEL
ALT: 27 U/L (ref 0–35)
Calcium: 8.6 mg/dL (ref 8.4–10.5)
Creatinine, Ser: 0.92 mg/dL (ref 0.4–1.2)
GFR calc Af Amer: >60 mL/min (ref >60)
Glucose, Bld: 99 mg/dL (ref 70–99)
Sodium: 138 mEq/L (ref 135–145)
Total Protein: 6.6 g/dL (ref 6.0–8.3)

## 2010-06-07 LAB — PROTIME-INR: Prothrombin Time: 15.4 seconds — ABNORMAL HIGH (ref 11.6–15.2)

## 2010-06-07 MED ORDER — IOHEXOL 300 MG/ML  SOLN
80.0000 mL | Freq: Once | INTRAMUSCULAR | Status: AC | PRN
  Administered 2010-06-07: 80 mL via INTRAVENOUS

## 2010-06-08 ENCOUNTER — Emergency Department (HOSPITAL_COMMUNITY): Payer: BC Managed Care – PPO

## 2010-06-08 DIAGNOSIS — C8589 Other specified types of non-Hodgkin lymphoma, extranodal and solid organ sites: Secondary | ICD-10-CM

## 2010-06-08 DIAGNOSIS — I82220 Acute embolism and thrombosis of inferior vena cava: Secondary | ICD-10-CM

## 2010-06-08 DIAGNOSIS — D689 Coagulation defect, unspecified: Secondary | ICD-10-CM

## 2010-06-08 LAB — CBC
HCT: 30 % — ABNORMAL LOW (ref 36.0–46.0)
Hemoglobin: 9.9 g/dL — ABNORMAL LOW (ref 12.0–15.0)
MCHC: 33 g/dL (ref 30.0–36.0)
RBC: 3.19 MIL/uL — ABNORMAL LOW (ref 3.87–5.11)

## 2010-06-08 LAB — COMPREHENSIVE METABOLIC PANEL
ALT: 22 U/L (ref 0–35)
AST: 14 U/L (ref 0–37)
Calcium: 8.8 mg/dL (ref 8.4–10.5)
Creatinine, Ser: 0.83 mg/dL (ref 0.4–1.2)
GFR calc Af Amer: >60 mL/min (ref >60)
Sodium: 141 mEq/L (ref 135–145)
Total Protein: 6.2 g/dL (ref 6.0–8.3)

## 2010-06-08 LAB — HEPARIN LEVEL (UNFRACTIONATED): Heparin Unfractionated: 0.59 IU/mL (ref 0.30–0.70)

## 2010-06-09 DIAGNOSIS — I82220 Acute embolism and thrombosis of inferior vena cava: Secondary | ICD-10-CM

## 2010-06-09 LAB — PROTIME-INR
INR: 1.06 (ref 0.00–1.49)
Prothrombin Time: 14 seconds (ref 11.6–15.2)

## 2010-06-09 LAB — CBC
HCT: 30.3 % — ABNORMAL LOW (ref 36.0–46.0)
MCV: 95 fL (ref 78.0–100.0)
Platelets: 160 10*3/uL (ref 150–400)
RBC: 3.19 MIL/uL — ABNORMAL LOW (ref 3.87–5.11)
WBC: 6.5 10*3/uL (ref 4.0–10.5)

## 2010-06-09 LAB — BASIC METABOLIC PANEL
BUN: 11 mg/dL (ref 6–23)
CO2: 27 mEq/L (ref 19–32)
Chloride: 102 mEq/L (ref 96–112)
Glucose, Bld: 114 mg/dL — ABNORMAL HIGH (ref 70–99)
Potassium: 3.7 mEq/L (ref 3.5–5.1)

## 2010-06-10 LAB — CROSSMATCH
Antibody Screen: NEGATIVE
Unit division: 0

## 2010-06-10 LAB — BASIC METABOLIC PANEL
CO2: 29 mEq/L (ref 19–32)
Glucose, Bld: 91 mg/dL (ref 70–99)
Potassium: 4 mEq/L (ref 3.5–5.1)
Sodium: 139 mEq/L (ref 135–145)

## 2010-06-10 LAB — CBC
HCT: 30.3 % — ABNORMAL LOW (ref 36.0–46.0)
Hemoglobin: 9.9 g/dL — ABNORMAL LOW (ref 12.0–15.0)
MCHC: 32.7 g/dL (ref 30.0–36.0)

## 2010-06-10 LAB — HEPARIN LEVEL (UNFRACTIONATED): Heparin Unfractionated: 0.47 IU/mL (ref 0.30–0.70)

## 2010-06-11 ENCOUNTER — Encounter (HOSPITAL_COMMUNITY): Payer: BC Managed Care – PPO

## 2010-06-11 LAB — HEPARIN LEVEL (UNFRACTIONATED): Heparin Unfractionated: 0.63 IU/mL (ref 0.30–0.70)

## 2010-06-11 LAB — CBC
HCT: 30.4 % — ABNORMAL LOW (ref 36.0–46.0)
MCV: 95 fL (ref 78.0–100.0)
RBC: 3.2 MIL/uL — ABNORMAL LOW (ref 3.87–5.11)
WBC: 5.9 10*3/uL (ref 4.0–10.5)

## 2010-06-11 NOTE — Consult Note (Signed)
[name], [name]                 ACCOUNT NO.:  [id number]  MEDICAL RECORD NO.:  [id number]  LOCATION:  3703                         FACILITY:  [facility]  PHYSICIAN:  [name] Hora. Fields, MD  DATE OF BIRTH:  [birth year]  DATE OF CONSULTATION:  06/08/2010 DATE OF DISCHARGE:                                CONSULTATION   REQUESTING PHYSICIAN:  [name] Clos, MD  REASON FOR CONSULTATION:  Port-A-Cath thrombosis with thrombus in superior vena cava.  HISTORY OF PRESENT ILLNESS:  The patient is a 55 year old female who is currently undergoing chemotherapy for B cell lymphoma.  She has developed a superior vena cava thrombosis around her Port-A-Cath. Although her chemotherapy treatment is not currently complete, she states it has been put on hold recently for decreased blood counts.  She stated that intermittently she has had facial and arm swelling bilaterally for several weeks.  She apparently had a failed attempt at thrombolysis in Interventional Radiology last week.  She has been on Coumadin which sometime has been therapeutic, but sometimes not.  She is currently on heparin drip in hospital.  She apparently had some hemoptysis recently but this was transient and now resolved.  She was seen by Dr. Annalee [name] for a ENT today who did not really see a possible nidus for her upper airway bleeding.  It is unknown whether or not this may be Pulmonary in nature.  However, it seems to have been transient and it is currently not ongoing.  REVIEW OF SYSTEMS:  She denies any shortness of breath.  She denies any chest pain.  PHYSICAL EXAMINATION:  VITAL SIGNS:  Temperature is 98.8, heart rate 51, blood pressure 123/81, oxygen saturations 93% on room air. HEENT:  She has no exophthalmos. NECK:  She has mild edema bilaterally. CHEST:  She has obvious chest wall collaterals with a right-sided Port-A- Cath which was palpable.  This was nontender and nonerythematous. EXTREMITY:  She has trace  right upper extremity edema compared to the left.  CT scan of the chest was reviewed which shows the Port-A-Cath in good position with the tip of the catheter at the innominate superior vena cava junction.  There is thrombus around the tip of the Port-A-Cath as well as in the surrounding SVC with near occlusion of the SVC.  There was a well developed collaterals in the chest.  ASSESSMENT:  I believe the patient's superior vena cava thrombosis is secondary to the Port-A-Cath.  She currently does not have a frank superior vena cava syndrome but she certainly is at risk of developing this.  Since she has failed thrombolysis and conservative measures, I believe the best option at this point would be removal of the Port-A- Cath which is most likely the nidus of clot formation.  She will be made n.p.o. after midnight.  Tonight, we will have her consented for the operation.  Risks, benefits, possible complications were explained to the patient today that include but not limited to bleeding, infection, recurrent thrombosis.  She understands and agrees to proceed.  She will also need 6 months of Coumadin therapy and hopefully she will tolerate this.  If she still requires central access for chemotherapy in  the future, we could consider placing an additional catheter from the femoral level or consider whether or not she had healed her upper central veins to determine whether or not she would be a candidate for another Port-A-Cath.     [name] Hora. Fields, MD     CEF/MEDQ  D:  06/08/2010  T:  06/09/2010  Job:  [id number]  Electronically Signed by [name] Bruns MD on 06/11/2010 09:29:09 AM

## 2010-06-11 NOTE — Op Note (Signed)
  [name], [name]                 ACCOUNT NO.:  [id number]  MEDICAL RECORD NO.:  [id number]  LOCATION:  3703                         FACILITY:  [facility]  PHYSICIAN:  Janetta Hora. Tywan Siever, MD  DATE OF BIRTH:  22-Oct-1955  DATE OF PROCEDURE:  06/09/2010 DATE OF DISCHARGE:                              OPERATIVE REPORT   PROCEDURE:  Removal of Port-A-Cath.  PREOPERATIVE DIAGNOSIS:  Superior vena cava thrombosis.  POSTOPERATIVE DIAGNOSIS:  Superior vena cava thrombosis.  SURGEON:  Janetta Hora. Delson Dulworth, MD  ANESTHESIA:  Local with IV sedation.  OPERATIVE DETAILS:  After obtaining informed consent, the patient was taken to the operating room.  The patient was placed in supine position on the operating table.  After adequate sedation, the patient's entire right upper chest was prepped and draped in the usual sterile fashion. Local anesthesia was infiltrated directly over the Port-A-Cath on the right anterior chest wall.  A transverse incision was made in thislocation and carried down through the subcutaneous tissues down to the level of the port.  The port was elevated up in the operative field. Gentle traction was then pulled on the port and the catheter came free easily.  The entire port and tip appeared to be intact.  Hemostasis was then obtained with direct pressure.  Subcutaneous tissues were reapproximated using running 3-0 Vicryl suture.  Skin was closed with 4- 0 Vicryl subcuticular stitch.  Dermabond was applied to the skin.  The patient tolerated the procedure well and there were no complications. Instrument, sponge, and needle counts were correct at the end of the case.  The patient was taken to the recovery room in stable condition.     Janetta Hora. Ingvald Theisen, MD     CEF/MEDQ  D:  06/09/2010  T:  06/10/2010  Job:  [id number]  Electronically Signed by Fabienne Bruns MD on 06/11/2010 09:29:13 AM

## 2010-06-14 LAB — CULTURE, BLOOD (SINGLE)
Culture  Setup Time: 201206051028
Culture: NO GROWTH

## 2010-06-16 ENCOUNTER — Encounter: Payer: BC Managed Care – PPO | Admitting: Vascular Surgery

## 2010-06-16 NOTE — Consult Note (Signed)
[name], [name]                 ACCOUNT NO.:  [id number]  MEDICAL RECORD NO.:  [id number]  LOCATION:                                 FACILITY:  PHYSICIAN:  Reece Packer, M.D. DATE OF BIRTH:  04-May-1955  DATE OF CONSULTATION: DATE OF DISCHARGE:                                CONSULTATION   The patient is a very pleasant 55 year old lady seen in consultation at the request of the Hospitalist Service as she is followed by Dr. Glenford Peers in [place] for history of diffuse large B-cell non-Hodgkin lymphoma which was stage IV at diagnosis in summer 2011 and treated with CHOP Rituxan followed by Rituxan for total of four cycles from diagnosis through March 2012.  I believe that chemotherapy was complicated by episodes of febrile neutropenia and also sepsis and ventilator requirement at one point.  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 Lovell Sheehan in [place]).  She was treated for the SVC during that admission with TPA by Interventional Radiology, with at least some improvement in her neck and face swelling by the time of discharge.  The patient tells me that the swelling in her face and neck had been intermittent for about a month prior to that admission.  Her INR was subtherapeutic when she was admitted on May 23, 2010, the patient having been on Coumadin for a prior DVT.  She had also had PET scan done on May 03, 2010, for restaging, which showed uptake in the nasopharyngeal area and she is scheduled to see Dr. Annalee Genta in this regard later this week.  She also has an upcoming outpatient appointment to Dr. Edilia Bo for consideration of SVC stent.  I understand that Dr. Mariel Sleet had been reluctant to have the Port-A-Cath removed as he thinks she will likely need further treatment for her lymphoma.  She was seen back by Dr.  Mariel Sleet on Jun 01, 2010, with puffy neck and face, evidence of venous collaterals and puffy hands described in his physical exam then.  The patient reports increased swelling in her face and neck over the past possibly 3-4 days.  She also had a temperature of over 102 degrees once on June 05, 2010, in the evening, with some associated chills.  She has had an increased cough during this time, with several episodes of bright red blood streaking in the secretions when her cough has been severe.  Her INR again was subtherapeutic on admission with the patient having been off her Lovenox for 2 days.  Labs on admission, June 07, 2010, otherwise white count 6.1, hemoglobin 10, platelets 164. Differential within normal limits.  INR 1.2.  Sodium 138, potassium 4.1, chloride 101, CO2 28, glucose 98, BUN 15, creatinine 0.9.  Total bili 0.3, alk phos 92, GOT 18, GPT 27, total protein 6.6, albumin 3.3, calcium 8.6.  Blood cultures x2 were sent and are pending.  She had chest x-ray on admission which showed the Port-A-Cath on the right with stable position and no airspace opacity.  She had CT chest which showed focal  clot with fibrin sheath  around the distal Port-A-Cath tip which was in the mid to superior SVC, with near occlusion at that area and collaterals in the right axillary and supraclavicular regions.  No adenopathy in the chest.  No effusion.  Emphysematous changes without focal consolidation or pulmonary nodules.  She subsequently had CT done of the maxillofacial region and followup of the PET scan findings, that without contrast and showed near-complete opacification right maxillary and extensive sphenoid and ethmoid sinus disease and in the last couple of images some asymmetric soft tissue in the left piriform sinus region. She was begun on the heparin infusion without bolus early this morning.  REVIEW OF SYSTEMS:  Shortness of breath when she has prolonged coughing episodes, but otherwise  not short of breath.  No other bleeding.  No pain in her neck and face feel tight.  No further high temperatures.  PAST HISTORY:  Intolerance to DEMEROL and SINGULAIR.  She has some B- cell non-Hodgkin lymphoma as above, chemotherapy treatments complicated by neutropenic fevers, pneumonia, acute sepsis, and respiratory failure for which she was on a ventilator in November 2011, type 2 diabetes, asthma, hypertension, hyperlipidemia, GERD, previous hysterectomy, and nasal sinus surgeries by Dr. Annalee Genta, Port-A-Cath as above.  FAMILY HISTORY:  Coronary artery disease.  SOCIAL HISTORY:  She lives in [place].  No tobacco or alcohol.  One of her daughters is an Charity fundraiser at [employer].  PHYSICAL EXAMINATION:  GENERAL:  She is a pleasant, cooperative lady, looks her stated age, up to the bathroom without assistance, and not in any acute discomfort, seated on the side of the bed on room air. VITAL SIGNS:  Temperature 98.8, heart rate 51 and regular, respirations 19, blood pressure 123/81, 93% saturated. EXTREMITIES:  Marked swelling of her neck bilaterally and the face without frank scleral edema now, slightly puffy hands, marked venous collaterals on the anterior chest.  Port-A-Cath site not remarkable, not accessed.  Peripheral IV infusing at a low rate in the right upper extremity and IV is locked in the right lower leg. LUNGS:  Crackles in the lower and mid fields posteriorly on both sides. No wheeze cannot tell JVD.  No use of accessory muscles of respiration. HEART:  Regular rate and rhythm. ABDOMEN:  Soft, nontender, quiet. LOWER EXTREMITIES:  No edema or cords.  She has extensive discoloration/bruising on her bilateral upper extremities.  LABORATORY DATA:  Today, white count 5.2, hemoglobin 9.9, platelets 159. CMET normal except albumin of 3.1.  I have spoken with the PA at Dr. Thornton Papas office as Dr. Mariel Sleet is out until June 14, 2010.  Office note from Jun 01, 2010,  visit attached.  IMPRESSION AND RECOMMENDATIONS: 1. Symptomatic recurrence of SVC related to the Port-A-Cath, status     post TPA about 2 weeks ago and subtherapeutic on anticoagulation at     admission.  Her primary oncologist, Dr. Mariel Sleet, was     reluctant to remove the port at least when the problem began due to     high likelihood of needing additional treatment for the lymphoma.     I agree with the present heparin infusion as I discussed with the     Hospitalist last p.m., unless significant bleeding.  We would     appreciate Dr. Edilia Bo seeing her in hospital, though I am not     certain that saving this Port-A-Cath is the best approach with this     recurrent SVC. 2. Hemoptysis prior to admission, apparently none since  admission, on     empiric antibiotics as well as the present heparin.  I have called     a consult to Dr. Annalee Genta. 3. Diffuse large B-cell non-Hodgkin lymphoma.  No recent chemotherapy     and counts are okay.  She has a nasopharyngeal and piriform sinus     findings of unclear etiology.  We will appreciate Dr. Thurmon Fair     input as above. 4. History of diabetes, emphysema, previous pneumonia, respiratory     failure, present respiratory symptoms, and     fever on vancomycin and Zosyn per the Hospitalist. 5. Mild anemia which looks stable.  I will follow with you.  Please call between visits if our service can help.     Reece Packer, M.D.     LPL/MEDQ  D:  06/08/2010  T:  06/08/2010  Job:  [id number]  cc:   Ladona Horns. Mariel Sleet, MD Kinnie Scales Annalee Genta, M.D. Di Kindle. Edilia Bo, M.D.  Electronically Signed by Jama Flavors M.D. on 06/16/2010 09:29:42 AM

## 2010-06-17 NOTE — Discharge Summary (Signed)
[name], [name]                 ACCOUNT NO.:  [id number]  MEDICAL RECORD NO.:  [id number]  LOCATION:  3703                         FACILITY:  [facility]  PHYSICIAN:  Hartley Barefoot, MD    DATE OF BIRTH:  07-Apr-1955  DATE OF ADMISSION:  06/07/2010 DATE OF DISCHARGE:  06/11/2010                              DISCHARGE SUMMARY   DISCHARGE DIAGNOSES: 1. Superior vena cava thrombosis. 2. Sinusitis. 3. Bronchitis. 4. Asthma. 5. History of deep vein thrombosis. 6. Cervical myelopathy. 7. History of high-grade B-cell lymphoma status post chemotherapy. 8. History of abnormal hypermetabolic activity at the posterior     nasopharynx per PET scan.  DISCHARGE MEDICATIONS: 1. Ciprofloxacin 500 mg p.o. b.i.d. 2. Lovenox 80 mg subcu b.i.d. 3. Fluticasone and salmeterol 250/50 one puff inhaled twice daily. 4. Coumadin 5 mg tablet 2 tablets by mouth daily. 5. Acyclovir 200 mg 1 capsule by mouth twice daily. 6. Albuterol inhaler 90 mcg 2 puffs inhaled every 4 hours as needed. 7. Tylenol 325 tablet, 1-2 tablets by mouth every 4 hours as needed. 8. Citalopram 20 mg 1 tablet by mouth daily. 9. Furosemide 20 mg 1 tablet by mouth daily as needed. 10.Ferrous sulfate 325 one tablet by mouth daily at bedtime. 11.Mentax 400 mg 1 tablet by mouth daily at bedtime. 12.Omeprazole 20 mg 1 tablet by mouth twice daily. 13.Potassium 20 mEq 30 mL by mouth twice daily. 14.Prednisone 5 mg p.o. every morning. 15.Simvastatin 80 mg half tablet by mouth daily.  DISPOSITION AND FOLLOWUP:  [name] will need to follow with her primary care physician on Monday for an INR check.  She will be provided with 5 days of Lovenox.  Depending on INR result, she might need more refills for Lovenox.  She will need to have adjust the Coumadin dose as needed.  She will need also to follow up with Dr. Annalee Genta for her sinusitis and further work as an outpatient for the hypermetabolic activity at the posterior nasopharynx.  She  will need to be on ciprofloxacin for 2 weeks as per Dr. Thurmon Fair recommendations.  CONSULTANT: 1. Dr. Darrold Span, oncologist. 2. Dr. Darrick Penna.  PROCEDURE:  Removal of Port-A-Cath on June 09, 2010.  BRIEF HISTORY OF PRESENT ILLNESS:  This is a very pleasant 55 year old who was recently discharged on May 26, 2010, after being treated for SVC clots where the patient had received t-PA.  The patient also has history of high-grade B cell lymphoma status post chemotherapy and history of hypermetabolic activity in the posterior nasopharynx for further work up with Dr. Annalee Genta, and had been discharged on May 23 and eventually the patient started getting more fullness in the face and neck area and over the last 2-3 days, she has been having hemoptysis with subjective feeling of fever and chills.  The patient did go to Dr. Mariel Sleet, after discharge and was referred to Dr. Edilia Bo for further workup of her SVC clot and she is yet to make an appointment which is due for next week. The patient relates some fullness feeling in her neck and the base and hemoptysis. 1. SVC thrombosis.  After multiple tests and ruling out hematoma with     CT,  maxillofacial CAT scan, the patient was started on IV heparin     and subsequently she was started on Coumadin over course of     hospitalization.  Dr. Darrick Penna evaluated the patient during     hospitalization and the recommendation was to remove the Port-A-     Cath and continue with anticoagulation.  The patient's symptoms of     swelling of her neck and face had decreased, no shortness of     breath.  She will be transitioned to Lovenox.  She will receive a     dose of Lovenox prior to discharge.  We will give prescription for     5 more days of Lovenox.  Her Coumadin will be increased to 10 mg     p.o. daily.  She will need an INR on Monday.  Her INR on June 8 was     1.15. 2. Hemoptysis.  Her hemoptysis was thought to be due to bronchitis.     She had an  ENT evaluation with Dr. Annalee Genta.  He did not see any     sign of bleeding on her upper airway.  No evidence of nasal polyp     or mass or tumor on his examination.  He was recommending biopsy     when her coagulation status is being stable.  He recommend to     follow with him as an outpatient.  He recommended to treat for     chronic sinusitis with 2-3 weeks of ciprofloxacin.  The patient's     hemoptysis has resolved at this time.  I wonder if there was a     component of some bronchitis. 3. Sinusitis, chronic.  The patient with a prior history of     Pseudomonas infection.  The patient will be discharged on     ciprofloxacin for 2 weeks.  She will need to follow with Dr.     Annalee Genta. 4. History of asthma.  We will continue with her home medications. 5. Anemia.  Hemoglobin has remained stable.  On the day of discharge     was at 10, increased from 9.  On the day of discharge, the patient was in improved condition.  Blood pressure 124/80, saturation 93 on room air, respirations 16, pulse 68, and temperature 98.4.  INR 1.1.  White blood cell 5.9, hemoglobin 10.0, and platelet 191.     Hartley Barefoot, MD     BR/MEDQ  D:  06/11/2010  T:  06/12/2010  Job:  [id number]  Electronically Signed by Hartley Barefoot MD on 06/17/2010 07:27:41 PM

## 2010-06-28 NOTE — H&P (Signed)
[name], [name]                 ACCOUNT NO.:  [id number]  MEDICAL RECORD NO.:  [id number]  LOCATION:  3703                         FACILITY:  [facility]  PHYSICIAN:  Eduard Clos, MDDATE OF BIRTH:  [birth year]  DATE OF ADMISSION:  06/07/2010 DATE OF DISCHARGE:                             HISTORY & PHYSICAL   PRIMARY CARE PHYSICIAN:  Kirk Ruths, MD  PRIMARY ONCOLOGIST:  Ladona Horns. Neijstrom, MD  CHIEF COMPLAINT:  Difficulty breathing and feeling of swelling in the upper part of the body.  HISTORY OF PRESENT ILLNESS:  A 55 year old female who was just recently discharged on May 26, 2010, after being treated for SVC clot where in the patient had received TPA.  The patient also has history of high- grade B-cell lymphoma status post chemotherapy and history of hypermetabolic activity in the posterior nasopharynx for further workup with Dr. Annalee Genta, and had been discharged on May 26, 2010, and eventually patient started getting more fullness in the face and neck area and over the last 2-3 days has been having hemoptysis with subjective feelings of fever and chills.  The patient did go to Dr. Mariel Sleet, after discharge and was referred to Dr. Edilia Bo, for further workup on her SVC clot and she is yet to make appointment which is due next week.  She is also supposed to follow with Dr. Annalee Genta for her abdominal hypermetabolic activity in the nasopharynx area as per PET scan.  At this time, the patient is not acutely in any distress.  She is not short of breath at this time, does not have any chest pain.  She does have some raw feeling in the pharyngeal area.  Denies any headache. Denies any nausea, vomiting, abdominal pain, dysuria, discharge, or diarrhea.  Denies any dizziness or loss of consciousness.  Denies any visual symptoms, but does have some fullness feeling in the neck and the face.  PAST MEDICAL HISTORY:  Recently admitted for SVC clot wherein the patient  had received TPA and the patient is on Lovenox and Coumadin and has been off of Lovenox for last 2 days and INR is subtherapeutic, previous history of DVT, history of high-grade B-cell lymphoma status post chemotherapy, history of sinusitis, history of abnormal hypermetabolic activity in the posterior nasopharynx, history of cervical myelopathy, history of asthma.  Medications prior to admission that has to be verified includes Coumadin, albuterol, Celexa,  Dulera, furosemide, ferrous sulfate, omeprazole, potassium chloride, Zocor, and prednisone.  ALLERGIES:  DEMEROL and SINGULAIR.  FAMILY HISTORY:  Significant for coronary artery disease.  PAST SURGICAL HISTORY:  Hysterectomy.  The patient also had a trach for respiratory failure.  REVIEW OF SYSTEMS:  As per history of present illness, nothing else significant.  PHYSICAL EXAMINATION:  GENERAL:  The patient examined at bedside not in acute distress.  She is able to complete sentences without any difficulty. VITAL SIGNS:  Blood pressure 120/50, pulse is 67 per minute, temperature 98.4, maximum was 99.3, respirations 18, O2 sat 94% on room air. HEENT:  Face looks slightly engorged.  No facial asymmetry.  PERRLA positive.  The patient is able to see in both eyes, able to move her eyes without any  difficulty. NECK:  Looks slightly engorged and puffy.  There is no localized tenderness.  No discharge from ears, eyes, nose, or mouth.  CHEST: Bilateral air entry present.  No rhonchi, no crepitation. HEART:  S1 and S2 heard. ABDOMEN:  Soft, nontender.  Bowel sounds heard. CNS:  Patient alert, awake, and oriented to time, place, and person. Moves upper and lower extremities 5/5. EXTREMITIES:  Peripheral pulses felt.  No edema.  LABORATORY DATA:  CT chest with contrast shows no acute abnormalities demonstrated in the chest.  No evidence of significant lymphadenopathy, again demonstrative atresia of the left jugular vein and  __________ fibrin sheath around the distal central venous catheter causing near occlusion of superior vena cava with associated collaterals.  Chest x- ray shows scarring in the lung, stable.  No significant change compared to recent prior exams.  CBC, WBC is 6.1, hemoglobin is 10, it is drop from 11 previously, hematocrit is 30.6, platelets 164.  PT/INR is 15.4 and 1.2.  Complete metabolic panel, sodium 138, potassium 4.1, chloride 101, carbon dioxide 28, glucose 99, BUN 15, creatinine 0.9, total bilirubin is 0.3, alkaline phosphatase 92, AST 18, ALT 27, total protein 6.6, albumin 3.3, calcium 8.6, BNP still 29.6.  ASSESSMENT: 1. Atresia obstruction.. 2. Hemoptysis. 3. History of abnormal hypermetabolic activity at the posterior     nasopharynx per the PET scan. 4. History of high-grade B-cell lymphoma status post chemotherapy. 5. History of cervical myelopathy. 6. History of asthma. 7. History of deep vein thrombosis. 8. Subtherapeutic INR.  PLAN: 1. At this time, we will admit patient to telemetry. 2. For her SVC clots and hemoptysis that I have discussed with Dr.     Darrold Span on-call oncologist, did not show at this time what is     causing the hemoptysis given the fact that patient does have some     abnormal uptake in the nasopharynx as per the recent PET scan.  We     are going to get a CT maxillofacial, CAT scan stat to make sure     there is no any active hematoma or bleed seen there.  If it is     negative then we are going to place patient on heparin IV infusion     to the lower extremities is carried along the upper extremities due     to SVC clot.  I also did discuss with Dr. Arbie Cookey who is on-call for     Dr. Edilia Bo about the  SVC clot, at this time Dr. Arbie Cookey feels that     only measure to be done now is to continue with anticoagulation,     Dr. Arbie Cookey is going to see the patient later. 3. As patient does have history of previous Pseudomonas sepsis and     patient  giving history of subjective fever or chills, I am going to     get blood cultures and until then I am going to keep patient on     vancomycin and Zosyn. If the  blood cultures are negative, we can     discontinue. 4. At this time, the patient is going to get CT maxillofacial to look     into if there is any abnormality in the nasopharyngeal area.  There     is no signs of any hematoma or acute bleeding or any abnormality.     We will start     IV heparin infusion without bolus as per  Dr. Darrold Span.  If there  is     an abnormality, we need to get an ENT consult. 5. Further recommendation as condition evolves.     Eduard Clos, MD     ANK/MEDQ  D:  06/08/2010  T:  06/08/2010  Job:  [id number]  cc:   Kirk Ruths, M.D. Ladona Horns. Mariel Sleet, MD  Electronically Signed by Midge Minium MD on 06/28/2010 07:26:44 AM

## 2010-06-30 ENCOUNTER — Other Ambulatory Visit (HOSPITAL_COMMUNITY): Payer: Self-pay | Admitting: Oncology

## 2010-06-30 ENCOUNTER — Encounter (HOSPITAL_COMMUNITY): Payer: BC Managed Care – PPO | Attending: Oncology

## 2010-06-30 ENCOUNTER — Ambulatory Visit (HOSPITAL_COMMUNITY): Payer: BC Managed Care – PPO | Admitting: Oncology

## 2010-06-30 DIAGNOSIS — C8589 Other specified types of non-Hodgkin lymphoma, extranodal and solid organ sites: Secondary | ICD-10-CM | POA: Insufficient documentation

## 2010-06-30 LAB — DIFFERENTIAL
Lymphocytes Relative: 12 % (ref 12–46)
Lymphs Abs: 0.6 10*3/uL — ABNORMAL LOW (ref 0.7–4.0)
Neutrophils Relative %: 74 % (ref 43–77)

## 2010-06-30 LAB — COMPREHENSIVE METABOLIC PANEL
AST: 28 U/L (ref 0–37)
BUN: 12 mg/dL (ref 6–23)
CO2: 30 mEq/L (ref 19–32)
Chloride: 105 mEq/L (ref 96–112)
Creatinine, Ser: 1.12 mg/dL — ABNORMAL HIGH (ref 0.50–1.10)
GFR calc non Af Amer: 51 mL/min — ABNORMAL LOW (ref >60)
Total Bilirubin: 0.4 mg/dL (ref 0.3–1.2)

## 2010-06-30 LAB — CBC
HCT: 34.5 % — ABNORMAL LOW (ref 36.0–46.0)
MCV: 98.9 fL (ref 78.0–100.0)
Platelets: 150 10*3/uL (ref 150–400)
RBC: 3.49 MIL/uL — ABNORMAL LOW (ref 3.87–5.11)
WBC: 5 10*3/uL (ref 4.0–10.5)

## 2010-06-30 LAB — SEDIMENTATION RATE: Sed Rate: 48 mm/hr — ABNORMAL HIGH (ref 0–22)

## 2010-07-05 ENCOUNTER — Encounter (HOSPITAL_COMMUNITY): Payer: BC Managed Care – PPO | Attending: Oncology | Admitting: Oncology

## 2010-07-05 ENCOUNTER — Other Ambulatory Visit (HOSPITAL_COMMUNITY): Payer: Self-pay | Admitting: Oncology

## 2010-07-05 DIAGNOSIS — C8581 Other specified types of non-Hodgkin lymphoma, lymph nodes of head, face, and neck: Secondary | ICD-10-CM

## 2010-07-05 DIAGNOSIS — C8590 Non-Hodgkin lymphoma, unspecified, unspecified site: Secondary | ICD-10-CM

## 2010-07-05 DIAGNOSIS — C8589 Other specified types of non-Hodgkin lymphoma, extranodal and solid organ sites: Secondary | ICD-10-CM | POA: Insufficient documentation

## 2010-07-15 ENCOUNTER — Other Ambulatory Visit: Payer: Self-pay | Admitting: Obstetrics & Gynecology

## 2010-07-15 DIAGNOSIS — Z139 Encounter for screening, unspecified: Secondary | ICD-10-CM

## 2010-07-16 ENCOUNTER — Ambulatory Visit (HOSPITAL_COMMUNITY)
Admission: RE | Admit: 2010-07-16 | Discharge: 2010-07-16 | Disposition: A | Discharge status: Home / Self Care | Payer: BC Managed Care – PPO | Source: Ambulatory Visit | Attending: Obstetrics & Gynecology | Admitting: Obstetrics & Gynecology

## 2010-07-16 DIAGNOSIS — Z1231 Encounter for screening mammogram for malignant neoplasm of breast: Secondary | ICD-10-CM | POA: Insufficient documentation

## 2010-07-16 DIAGNOSIS — Z139 Encounter for screening, unspecified: Secondary | ICD-10-CM

## 2010-07-23 ENCOUNTER — Encounter (HOSPITAL_COMMUNITY): Payer: BC Managed Care – PPO

## 2010-08-02 NOTE — Discharge Summary (Signed)
[name], [name]                 ACCOUNT NO.:  [id number]  MEDICAL RECORD NO.:  [id number]           PATIENT TYPE:  I  LOCATION:  3021                         FACILITY:  [facility]  PHYSICIAN:  [name] I [name] Suder, MD      DATE OF BIRTH:  08-09-1955  DATE OF ADMISSION:  05/23/2010 DATE OF DISCHARGE:  05/26/2010                              DISCHARGE SUMMARY   PRIMARY CARE PHYSICIAN:  [name] Ruths, MD  ONCOLOGIST:  [name] Horns. Neijstrom, MD  DISCHARGE DIAGNOSES: 1. 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  Status post tissue plasminogen     activator with resolution of symptoms. 2. Subtherapeutic Coumadin level. 3. History of deep vein thrombosis. 4. History of high-grade B-cell lymphoma, status post chemotherapy. 5. History of sinusitis. 6. Has recent PET scan which did show hypermetabolic activity at the     posterior nasopharynx, is scheduled to see Dr. Annalee [name]. 7. History of cervical myelopathy. 8. History of asthma.  DISCHARGE MEDICATIONS: 1. Lovenox 75 mg subcu every 12 hours. 2. Coumadin 7.5 mg p.o. daily.  Coumadin level will be checks by the     patient MD on Friday, May 28, 2010. 3. Acyclovir 200 mg p.o. b.i.d. 4. Albuterol 90 mg inhaler every 4 hours as needed. 5. Celexa 20 mg p.o. daily. 6. Dulera 2 puffs inhaled twice daily. 7. Furosemide 20 mg p.o. daily. 8. Ferrous sulfate 325 mg p.o. daily. 9. Omeprazole 20 mg twice daily. 10.Potassium chloride 30 mEq twice daily. 11.Prednisone 5 mg p.o. daily. 12.Zocor 80 mg p.o. at bedtime.  PROCEDURE:  CT neck with contrast and CT chest, left jugular vein is atretic and nearly occluded as it joins the left subclavian vein.  A small filling defect is noted in the distal jugular vein on the left. Right jugular vein is normal.  Negative for mass lesion, sinusitis, high- grade stricture of superior vena  cava or small patent lumen in the SVC. No associated mass or adenopathy.  A right Port-A-Cath tip in the SVC.  HISTORY OF PRESENT ILLNESS:  This is a 55 year old female with a history of diffuse large B-cell lymphoma on July 2011, status post four cycles of chemo, and three cycles of chemo complicated by neutropenic fever and pseudomonas sepsis.  She has finished her last chemotherapy in March 2012 and since then, she presented with facial swelling with neck swelling associated with some headache, congestion in her eyes, feeling dizzy with purple discoloration of her face, neck, and hand.  No blurry vision.  The patient has a history of DVT on Coumadin and on presentation, the patient's INR was subtherapeutic around 1.2 and hemoglobin of 12.3, hematocrit 37.1, and platelets 119. 1. SVC occlusion which is leading to superior vena cava syndrome.  The     patient received TPA per direction of the IR and the patient's     swelling on the face is completely resolved likely is the cause of     the patient's  subtherapeutic INR.  Case discussed with Dr.     Mariel [name] and at the present time, the patient will be discharge     with Lovenox and Coumadin.  Dose of Coumadin need to be further     adjusted by the patient MD.  Appointment taken on Friday, May 28, 2010, for further direction of the patient's Coumadin.  The patient     will be discharge with Coumadin 7.5 mg p.o. daily, in addition to     Lovenox 75 mg subcu twice daily.  The education will be done in     this hospital stay.  Also, the patient will see Dr. Mariel [name].  The     patient may need to be  on Lovenox only. 2. History of sinusitis.  The patient will see Dr. Annalee [name] next week     for further plan of biopsy of the hypermetabolic area at the left     posterior nasopharynx. 3. History of diffuse large B-cell lymphoma.  Case discussed with Dr.     Alvy [name] at this time.  We will hold on removing the Port-A-Cath.     We will  continue with Lovenox and Coumadin and our plan is to keep     INR therapeutic between 2 and 3.     [name] Nikolai Bosie Helper, MD     HIE/MEDQ  D:  05/26/2010  T:  05/27/2010  Job:  [id number]  cc:   [name] Horns. [name] Sleet, MD [name] Ruths, MD  Electronically Signed by [name] Cargo MD on 08/02/2010 02:31:53 PM

## 2010-08-03 ENCOUNTER — Ambulatory Visit: Payer: BC Managed Care – PPO | Admitting: Critical Care Medicine

## 2010-08-10 ENCOUNTER — Encounter: Payer: Self-pay | Admitting: Critical Care Medicine

## 2010-08-11 ENCOUNTER — Ambulatory Visit: Payer: BC Managed Care – PPO | Admitting: Critical Care Medicine

## 2010-08-11 ENCOUNTER — Ambulatory Visit (INDEPENDENT_AMBULATORY_CARE_PROVIDER_SITE_OTHER): Payer: BC Managed Care – PPO | Admitting: Critical Care Medicine

## 2010-08-11 ENCOUNTER — Ambulatory Visit (INDEPENDENT_AMBULATORY_CARE_PROVIDER_SITE_OTHER)
Admission: RE | Admit: 2010-08-11 | Discharge: 2010-08-11 | Disposition: A | Discharge status: Home / Self Care | Payer: BC Managed Care – PPO | Source: Ambulatory Visit | Attending: Critical Care Medicine | Admitting: Critical Care Medicine

## 2010-08-11 ENCOUNTER — Encounter: Payer: Self-pay | Admitting: Critical Care Medicine

## 2010-08-11 VITALS — BP 140/68 | HR 76 | Temp 98.0°F | Ht 63.0 in | Wt 167.8 lb

## 2010-08-11 DIAGNOSIS — J45901 Unspecified asthma with (acute) exacerbation: Secondary | ICD-10-CM

## 2010-08-11 DIAGNOSIS — J4489 Other specified chronic obstructive pulmonary disease: Secondary | ICD-10-CM

## 2010-08-11 DIAGNOSIS — J449 Chronic obstructive pulmonary disease, unspecified: Secondary | ICD-10-CM

## 2010-08-11 MED ORDER — CIPROFLOXACIN HCL 750 MG PO TABS: 750.0000 mg | ORAL_TABLET | Freq: Two times a day (BID) | ORAL | Status: AC

## 2010-08-11 MED ORDER — PREDNISONE 10 MG PO TABS: ORAL_TABLET | ORAL | Status: DC

## 2010-08-11 NOTE — Progress Notes (Signed)
Subjective:    Patient ID: [name], female    DOB: Aug 13, 1955, 55 y.o.   MRN: [id number]  HPI 55 y.o.WF In hosp twice 6/12 for SVC clot and light headedness started.  Cough spell and went black.  Notes more dyspnea and cough .   Notes no real pndrip.  Notes some wheeze.  Last chemo was 3/12. Rx every 6months.  Saw ENT:  Sinuses seemed clear.  Asthma History:   Symptoms  Daily         Nighttime Awakenings  3-4/month         Asthma interference with normal activity  Minor limitations         SABA use (not for EIB)  > 2 days/wk--not > 1 x/day         Risk: Exacerbations requiring oral systemic steroids  0-1 / year          [facility] 05/26/10: 1. 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 Status post tissue plasminogen  activator with resolution of symptoms.  2. Subtherapeutic Coumadin level.  3. History of deep vein thrombosis.  4. History of high-grade B-cell lymphoma, status post chemotherapy.  5. History of sinusitis.  6. Has recent PET scan which did show hypermetabolic activity at the  posterior nasopharynx, is scheduled to see Dr.   06/07/10 Hosp 1. Superior vena cava thrombosis.  2. Sinusitis.  3. Bronchitis.  4. Asthma.  5. History of deep vein thrombosis.  6. Cervical myelopathy.  7. History of high-grade B-cell lymphoma status post chemotherapy.  8. History of abnormal hypermetabolic activity at the posterior  nasopharynx per PET scan.  06/07/10 CT: IMPRESSION:  No acute abnormalities demonstrated in the chest. No evidence of  significant lymphadenopathy. Again demonstrated are atresia of the  left jugular vein and focal clot or fibrin sheath around the distal  central venous catheter causing near occlusion of the superior vena  cava with associated collaterals.   6/4/ CT sinus IMPRESSION:  1. Extensive paranasal sinus disease.  2. Small calculus  noted than the right submandibular gland.  3. No acute bony findings and no adenopathy.  4. Fullness in the left piriform sinus. This is incompletely  evaluated. Direct visualization is suggested.     Review of Systems Constitutional:   No  weight loss, night sweats,  Fevers, chills, fatigue, lassitude. HEENT:   No headaches,  Difficulty swallowing,  Tooth/dental problems,  Sore throat,                No sneezing, itching, ear ache, nasal congestion, post nasal drip,   CV:  No chest pain,  Orthopnea, PND, swelling in lower extremities, anasarca, dizziness, palpitations  GI  No heartburn, indigestion, abdominal pain, nausea, vomiting, diarrhea, change in bowel habits, loss of appetite  Resp: No shortness of breath with exertion or at rest.  No excess mucus, no productive cough,  No non-productive cough,  No coughing up of blood.  No change in color of mucus.  No wheezing.  No chest wall deformity  Skin: no rash or lesions.  GU: no dysuria, change in color of urine, no urgency or frequency.  No flank pain.  MS:  No joint pain or swelling.  No decreased range of motion.  No back pain.  Psych:  No change in mood or affect. No depression or anxiety.  No memory loss.  Objective:   Physical Exam Filed Vitals:   08/11/10 0932  BP: 140/68  Pulse: 76  Temp: 98 F (36.7 C)  TempSrc: Oral  Height: 5\' 3"  (1.6 m)  Weight: 167 lb 12.8 oz (76.114 kg)  SpO2: 96%    Gen: Pleasant, well-nourished, in no distress,  normal affect  ENT: No lesions,  mouth clear,  oropharynx clear, no postnasal drip  Neck: No JVD, no TMG, no carotid bruits  Lungs: No use of accessory muscles, no dullness to percussion, clear without rales or rhonchi  Cardiovascular: RRR, heart sounds normal, no murmur or gallops, no peripheral edema  Abdomen: soft and NT, no HSM,  BS normal  Musculoskeletal: No deformities, no cyanosis or clubbing  Neuro: alert, non focal  Skin: Warm, no lesions or  rashes  08/11/10 CXR IMPRESSION:  1. Chronic peribronchial thickening/slight interstitial prominence  of the lung bases. This can be seen in the setting of acute or  chronic bronchitis, asthma, or smoking.  2. No acute abnormality, compared to priors.     Assessment & Plan:   OBSTRUCTIVE CHRONIC BRONCHITIS Recurrent asthmatic bronchitis with flare Neg CXR Plan Cipro one twice daily for 10day Prednisone 10mg  Take 4 for two days, three for two days, two for two days, then one for two days and then 1/2 a day and stay No other med changes     Updated Medication List Outpatient Encounter Prescriptions as of 08/11/2010  Medication Sig Dispense Refill  . acyclovir (ZOVIRAX) 200 MG capsule 2 capsules twice per day       . albuterol (VENTOLIN HFA) 108 (90 BASE) MCG/ACT inhaler Inhale 2 puffs into the lungs every 6 (six) hours as needed.        . citalopram (CELEXA) 20 MG tablet Take 20 mg by mouth every morning.        [name] Kitchen EPINEPHrine (EPIPEN IJ) As needed for severe allergic reaction       . furosemide (LASIX) 20 MG tablet Take 1 tablet by mouth as needed.      . Mometasone Furo-Formoterol Fum 200-5 MCG/ACT AERO Inhale 2 Inhalers into the lungs 2 (two) times daily.        [name] Kitchen omeprazole (PRILOSEC) 20 MG capsule Take 20 mg by mouth 2 (two) times daily.        . potassium chloride 40 MEQ/15ML (20%) LIQD 2 tablespoons twice daily       . predniSONE (DELTASONE) 10 MG tablet Take 4 for two days, three for two days, two for two days, then one for two days and  Then 1/2 tab daily and stay   60 tablet  6  . simvastatin (ZOCOR) 80 MG tablet Take 80 mg by mouth at bedtime.        [name] Kitchen warfarin (COUMADIN) 10 MG tablet Take by mouth daily. As directed       . zafirlukast (ACCOLATE) 20 MG tablet Take 20 mg by mouth 2 (two) times daily.        [name] Kitchen DISCONTD: ADVAIR DISKUS 250-50 MCG/DOSE AEPB Inhale 1 puff into the lungs Twice daily.      [name] Kitchen DISCONTD: predniSONE (DELTASONE) 10 MG tablet Take 5 mg by mouth daily.        [name] Kitchen albuterol (PROVENTIL) (2.5 MG/3ML) 0.083% nebulizer solution Take 2.5 mg by nebulization every 4 (four) hours as needed.        . ciprofloxacin (CIPRO) 750 MG tablet Take 1 tablet (750 mg total) by mouth 2 (two) times daily.  20  tablet  0  . DISCONTD: HYDROcodone-acetaminophen (VICODIN) 5-500 MG per tablet Take 1 tablet by mouth every 6 (six) hours as needed.        [name] Kitchen DISCONTD: warfarin (COUMADIN) 1 MG tablet Take 1 mg by mouth as directed.

## 2010-08-11 NOTE — Progress Notes (Signed)
Quick Note:  Notify the patient that the Xray is stable and no pneumonia Continue current meds as prescribed at last office visit ______

## 2010-08-11 NOTE — Patient Instructions (Signed)
Cipro one twice daily for 10day Prednisone 10mg  Take 4 for two days, three for two days, two for two days, then one for two days and then 1/2 a day and stay No other med changes Chest xray today Return 1 month

## 2010-08-12 NOTE — Progress Notes (Signed)
Quick Note:  Called, spoke with pt. She is aware xray is stable and no pna. She is aware to cont current meds as prescribed at last OV. She verbalized understanding of these results and recs. ______

## 2010-08-13 NOTE — Assessment & Plan Note (Addendum)
Recurrent asthmatic bronchitis with flare Neg CXR Plan Cipro one twice daily for 10day Prednisone 10mg  Take 4 for two days, three for two days, two for two days, then one for two days and then 1/2 a day and stay No other med changes

## 2010-08-18 ENCOUNTER — Other Ambulatory Visit (HOSPITAL_COMMUNITY): Payer: BC Managed Care – PPO

## 2010-08-29 IMAGING — CR DG CHEST 2V
2 series · 2 of 2 positions shown · non-contrast
Comparison: 09/26/2008

CLINICAL DATA: Cough, congestion, fever, occasional shortness of
breath, question pneumonia

CHEST - 2 VIEW

[view not recorded (1 of 2)]
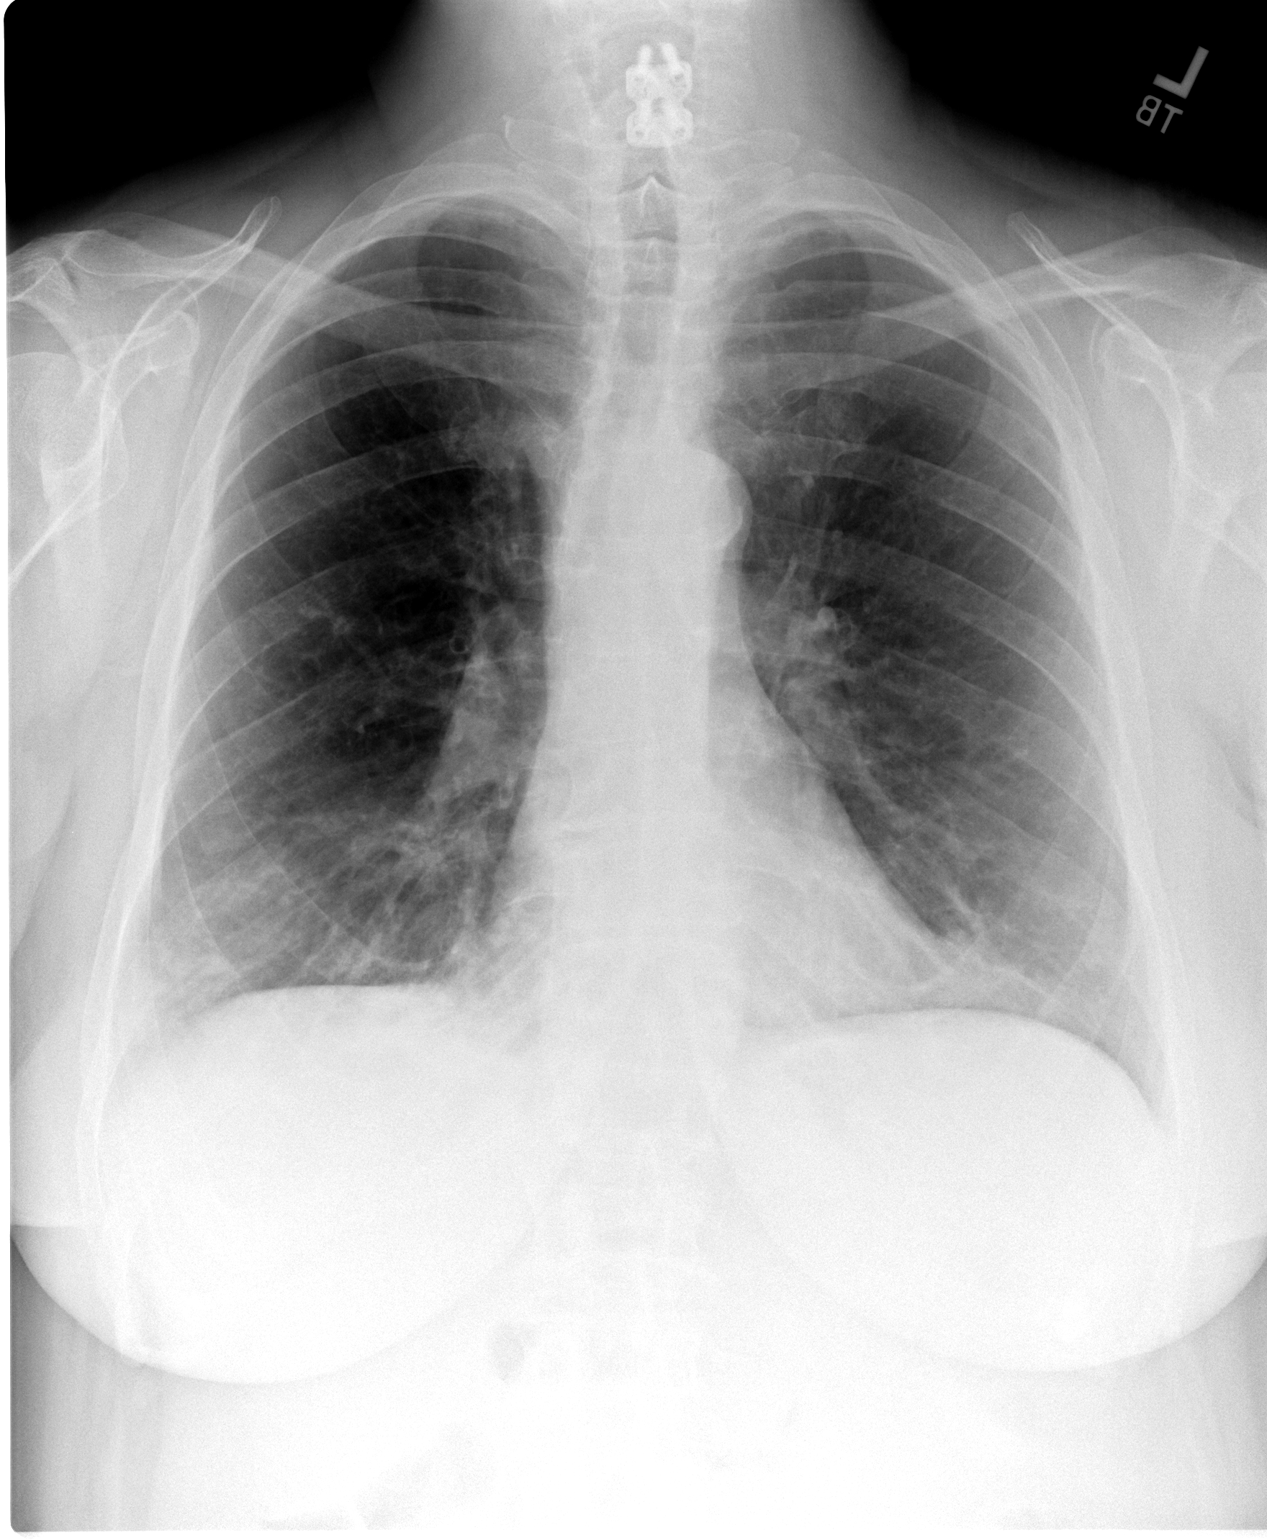

[view not recorded (2 of 2)]
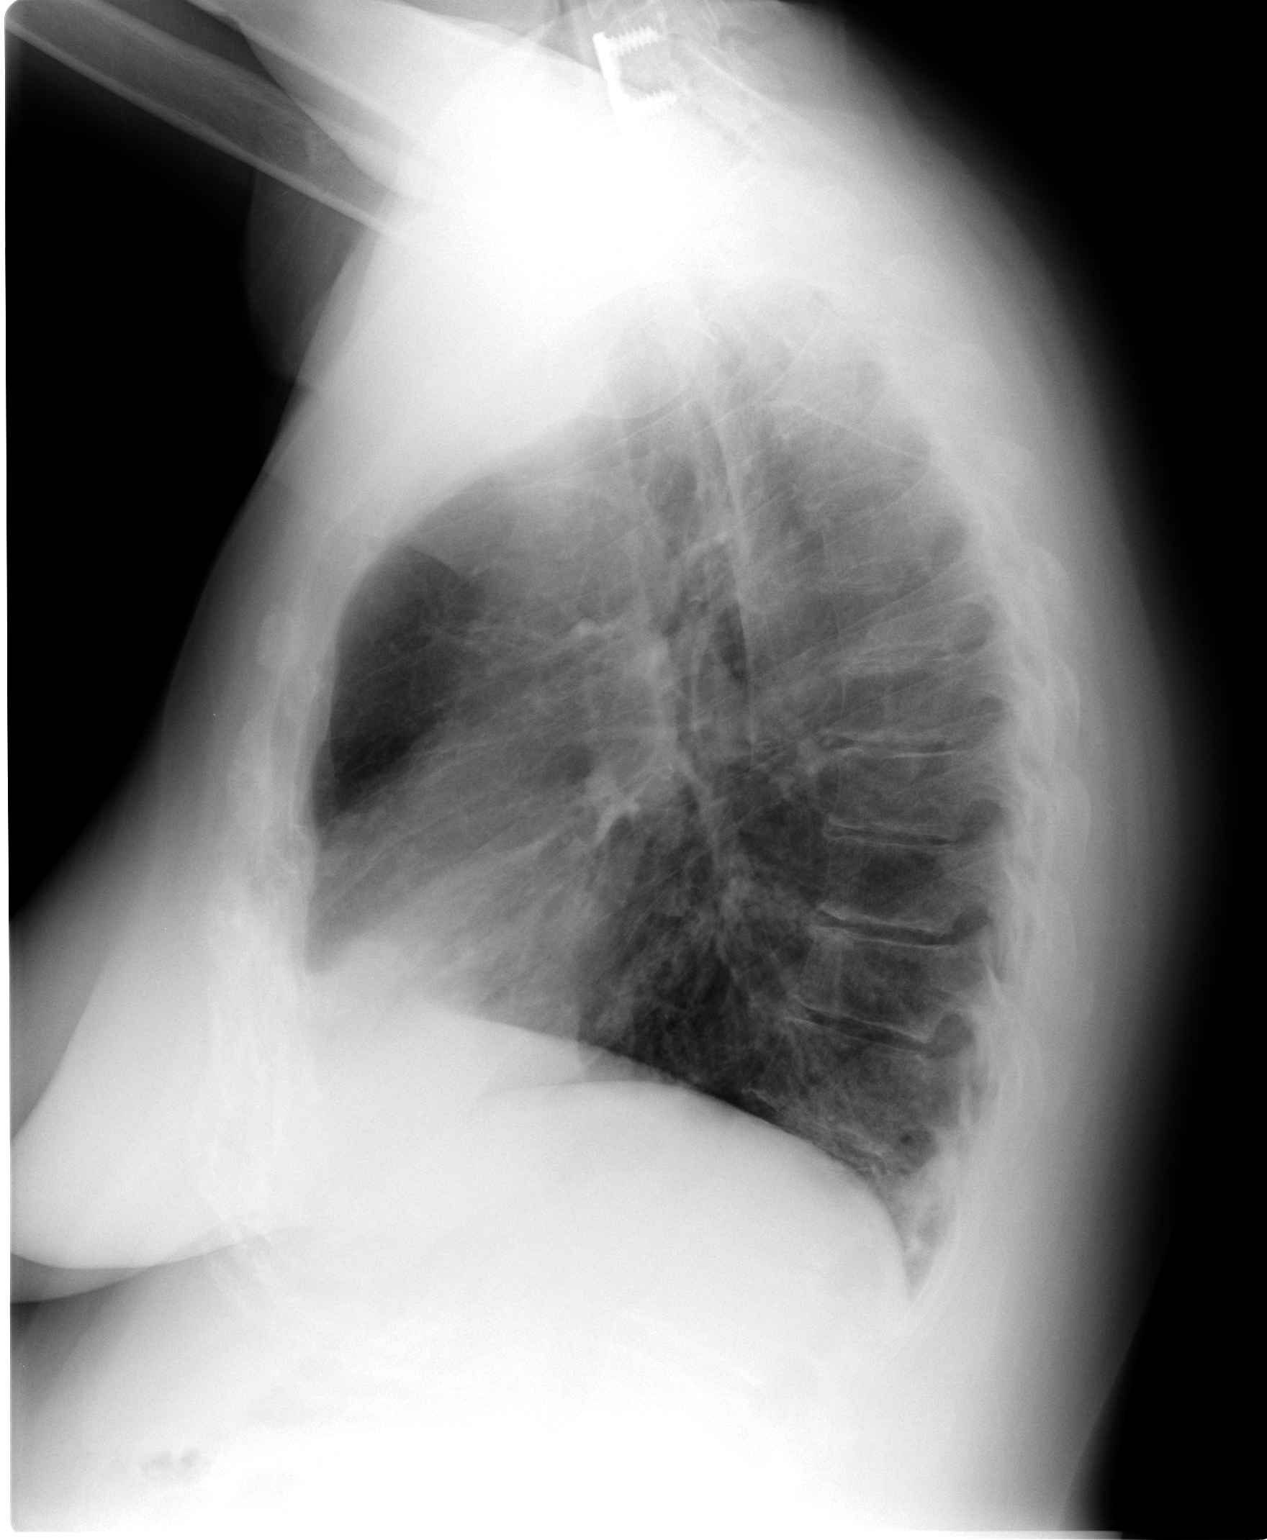

[2 of 2 positions shown; findings below may reference images not displayed]

FINDINGS: Normal heart size, mediastinal contours, and pulmonary vascularity.
Mild chronic bronchitic changes.
Focus of infiltrate or less likely atelectasis identified at right
lung base.
Remaining lungs clear.
Bones appear demineralized with scattered degenerative disc disease
changes of the thoracic spine and evidence of prior cervical spine
fusion.
IMPRESSION: Bronchitic changes of mild right basilar infiltrate.

## 2010-09-03 ENCOUNTER — Encounter (HOSPITAL_COMMUNITY): Payer: BC Managed Care – PPO | Attending: Oncology | Admitting: Oncology

## 2010-09-03 ENCOUNTER — Ambulatory Visit (HOSPITAL_COMMUNITY)
Admission: RE | Admit: 2010-09-03 | Discharge: 2010-09-03 | Disposition: A | Discharge status: Home / Self Care | Payer: BC Managed Care – PPO | Source: Ambulatory Visit | Attending: Oncology | Admitting: Oncology

## 2010-09-03 DIAGNOSIS — C8589 Other specified types of non-Hodgkin lymphoma, extranodal and solid organ sites: Secondary | ICD-10-CM

## 2010-09-03 DIAGNOSIS — R059 Cough, unspecified: Secondary | ICD-10-CM

## 2010-09-03 DIAGNOSIS — R058 Other specified cough: Secondary | ICD-10-CM

## 2010-09-03 DIAGNOSIS — Z8571 Personal history of Hodgkin lymphoma: Secondary | ICD-10-CM | POA: Insufficient documentation

## 2010-09-03 DIAGNOSIS — R05 Cough: Secondary | ICD-10-CM

## 2010-09-03 DIAGNOSIS — R918 Other nonspecific abnormal finding of lung field: Secondary | ICD-10-CM | POA: Insufficient documentation

## 2010-09-03 DIAGNOSIS — R509 Fever, unspecified: Secondary | ICD-10-CM

## 2010-09-03 DIAGNOSIS — R0602 Shortness of breath: Secondary | ICD-10-CM | POA: Insufficient documentation

## 2010-09-03 LAB — EXPECTORATED SPUTUM ASSESSMENT W GRAM STAIN, RFLX TO RESP C

## 2010-09-03 MED ORDER — MOXIFLOXACIN HCL 400 MG PO TABS: 400.0000 mg | ORAL_TABLET | Freq: Every day | ORAL | Status: AC

## 2010-09-03 NOTE — Progress Notes (Signed)
[place] [id number] [place] [address]  1. Cough  DG Chest 2 View, moxifloxacin (AVELOX) 400 MG tablet, Culture, sputum-assessment  2. Sputum production  DG Chest 2 View, moxifloxacin (AVELOX) 400 MG tablet, Culture, sputum-assessment     INTERVAL HISTORY: [name] 55 y.o. female returns for a walk-in for fevers and left back pain.  She is an established patient at the [facility] for lymphoma.  The patient reports a 2 week history of fevers with a maximum temperature of 101.8.  She reports that she has been self treating her temperatures with Tylenol which has been effectively "breaking" her fevers.  She denies any nasal discharge or nasal drainage.  She denies a sore throat.  She reports a cough that is productive of yellow sputum.     The patient also reports a "dull, achy" pain or discomfort of her left, posterior thorax.  She denies any rash.  This pain does not radiate.  It is an intermittent pain.  She rates it as a 4-5 on a 10 point pain scale when the pain is present.  She denies any discomfort with ROM presently.  She denies any point tenderness currently.  She denies any spinal pain.  She is worried about the above mentioned symptoms because it reminds her of her lymphoma presentation.  She has an appointment to see her pulmonologist on September 12th.  She will keep this appointment.  Past Medical History  Diagnosis Date  . Non Hodgkin's lymphoma   . Allergic rhinitis   . GERD (gastroesophageal reflux disease)   . Asthma   . Chronic sinusitis   . Respiratory failure   . Cavitary lung disease     has NON-HODGKIN'S LYMPHOMA; GASTRIC POLYP; HYPERLIPIDEMIA; HYPERTENSION; SINUSITIS, CHRONIC NOS; ALLERGIC RHINITIS; VOCAL CORD DISORDER; OBSTRUCTIVE CHRONIC BRONCHITIS; EXTRINSIC ASTHMA, UNSPECIFIED; GERD; DIABETES MELLITUS, BORDERLINE; and HYPOGAMMAGLOBULINEMIA on her problem list.     is allergic to meperidine hcl and  montelukast sodium.  Ms. Whicker does not currently have medications on file.  Past Surgical History  Procedure Date  . Vesicovaginal fistula closure w/ tah   . Nasal sinus surgery   . Neck surgery     x 2   . Breast surgery   . Portacath placement 7/11    Denies any headaches, dizziness, double vision, nausea, vomiting, diarrhea, constipation, chest pain, heart palpitations, blood in stool, black tarry stool, urinary pain, urinary burning, urinary frequency, hematuria.   PHYSICAL EXAMINATION  ECOG PERFORMANCE STATUS: 0 - Asymptomatic  Filed Vitals:   09/03/10 1201  BP: 124/80  Pulse: 74  Temp: 98.4 F (36.9 C)    GENERAL:alert, healthy, no distress, well nourished, well developed, anxious, comfortable, cooperative and smiling SKIN: skin color, texture, turgor are normal, no rashes or significant lesions HEAD: Normocephalic EYES: normal EARS: External ears normal OROPHARYNX:mucous membranes are moist  NECK: trachea midline LYMPH:  not examined BREAST:not examined LUNGS: positive findings: wheezing  diffusely and bibasilar crackles.  No dullness to percussion throughout. HEART: regular rate & rhythm, no murmurs, no gallops, S1 normal and S2 normal ABDOMEN:abdomen soft, non-tender and normal bowel sounds BACK: Back symmetric, no curvature., No CVA tenderness, no point tenderness on the left, posterior thorax.  No rashes or lesions.  No tenderness to palpation.  No spinal tenderness. EXTREMITIES:less then 2 second capillary refill, no joint deformities, effusion, or inflammation, no edema, no skin discoloration, no clubbing, no cyanosis  NEURO: alert & oriented x  3 with fluent speech, no focal motor/sensory deficits, gait normal   RADIOGRAPHIC STUDIES: Chest X-ray pending     ASSESSMENT:  1. Presumed pseudomonas infection 2. H/O pseudomonas infection 3. Possible pneumonia 4. Cough with yellow sputum production 5. Fevers 6. Left sided posterior thorax  discomfort.   PLAN:  1. Sputum culture today 2. Chest X-ray today 3. Avelox 400 mg daily x 10 days 4. She will maintain her Pulmonology appointment on September 12th   All questions were answered. The patient knows to call the clinic with any problems, questions or concerns. We can certainly see the patient much sooner if necessary.  The patient and plan discussed with Glenford Peers, MD and he is in agreement with the aforementioned.  KEFALAS,THOMAS

## 2010-09-06 LAB — CULTURE, RESPIRATORY W GRAM STAIN

## 2010-09-09 ENCOUNTER — Encounter (HOSPITAL_COMMUNITY): Payer: BC Managed Care – PPO | Attending: Oncology

## 2010-09-09 DIAGNOSIS — C8589 Other specified types of non-Hodgkin lymphoma, extranodal and solid organ sites: Secondary | ICD-10-CM | POA: Insufficient documentation

## 2010-09-09 LAB — DIFFERENTIAL
Basophils Absolute: 0 10*3/uL (ref 0.0–0.1)
Lymphocytes Relative: 9 % — ABNORMAL LOW (ref 12–46)
Monocytes Absolute: 0.7 10*3/uL (ref 0.1–1.0)
Neutro Abs: 7.9 10*3/uL — ABNORMAL HIGH (ref 1.7–7.7)

## 2010-09-09 LAB — CBC
MCH: 31.1 pg (ref 26.0–34.0)
MCV: 95.6 fL (ref 78.0–100.0)
Platelets: 178 10*3/uL (ref 150–400)
RDW: 14.2 % (ref 11.5–15.5)

## 2010-09-09 LAB — COMPREHENSIVE METABOLIC PANEL
AST: 15 U/L (ref 0–37)
Albumin: 3.7 g/dL (ref 3.5–5.2)
CO2: 30 mEq/L (ref 19–32)
Calcium: 9.3 mg/dL (ref 8.4–10.5)
Creatinine, Ser: 1.11 mg/dL — ABNORMAL HIGH (ref 0.50–1.10)
GFR calc non Af Amer: 51 mL/min — ABNORMAL LOW (ref >60)

## 2010-09-09 NOTE — Progress Notes (Signed)
Labs drawn today for cbc/diff,cmp,ldh 

## 2010-09-10 ENCOUNTER — Encounter (HOSPITAL_BASED_OUTPATIENT_CLINIC_OR_DEPARTMENT_OTHER): Payer: BC Managed Care – PPO

## 2010-09-10 ENCOUNTER — Other Ambulatory Visit (HOSPITAL_COMMUNITY): Payer: Self-pay | Admitting: Oncology

## 2010-09-10 VITALS — BP 119/77 | HR 60 | Temp 97.7°F | Ht 62.0 in | Wt 169.2 lb

## 2010-09-10 DIAGNOSIS — C8589 Other specified types of non-Hodgkin lymphoma, extranodal and solid organ sites: Secondary | ICD-10-CM

## 2010-09-10 DIAGNOSIS — Z5112 Encounter for antineoplastic immunotherapy: Secondary | ICD-10-CM

## 2010-09-10 MED ORDER — RITUXIMAB CHEMO INJECTION 10 MG/ML
375.0000 mg/m2 | Freq: Once | INTRAVENOUS | Status: DC
  Filled 2010-09-10 (×2): qty 70

## 2010-09-10 MED ORDER — SODIUM CHLORIDE 0.9 % IV SOLN
375.0000 mg/m2 | Freq: Once | INTRAVENOUS | Status: AC
  Administered 2010-09-10: 700 mg via INTRAVENOUS
  Filled 2010-09-10 (×2): qty 70

## 2010-09-10 MED ORDER — SODIUM CHLORIDE 0.9 % IV SOLN
Freq: Once | INTRAVENOUS | Status: AC
  Administered 2010-09-10: 09:00:00 via INTRAVENOUS

## 2010-09-10 NOTE — Progress Notes (Signed)
Rituxan started at 50cc/hr x 30 mins, then increase to 100cc/hr x , then increase to 150cc/hr x 30 mins, then increase to 200cc/hr until completed.

## 2010-09-15 ENCOUNTER — Encounter: Payer: Self-pay | Admitting: Critical Care Medicine

## 2010-09-15 ENCOUNTER — Ambulatory Visit (INDEPENDENT_AMBULATORY_CARE_PROVIDER_SITE_OTHER): Payer: BC Managed Care – PPO | Admitting: Critical Care Medicine

## 2010-09-15 DIAGNOSIS — K219 Gastro-esophageal reflux disease without esophagitis: Secondary | ICD-10-CM

## 2010-09-15 DIAGNOSIS — J45909 Unspecified asthma, uncomplicated: Secondary | ICD-10-CM

## 2010-09-15 MED ORDER — OMEPRAZOLE 20 MG PO CPDR: 20.0000 mg | DELAYED_RELEASE_CAPSULE | Freq: Two times a day (BID) | ORAL | Status: DC

## 2010-09-15 MED ORDER — ALBUTEROL SULFATE (2.5 MG/3ML) 0.083% IN NEBU: 2.5000 mg | INHALATION_SOLUTION | RESPIRATORY_TRACT | Status: DC | PRN

## 2010-09-15 MED ORDER — MOMETASONE FURO-FORMOTEROL FUM 200-5 MCG/ACT IN AERO: 2.0000 | INHALATION_SPRAY | Freq: Two times a day (BID) | RESPIRATORY_TRACT | Status: DC

## 2010-09-15 NOTE — Patient Instructions (Signed)
Work on R sinus with neil med sinus rinse bottle No change in medications. Return in         2 months  Refills sent

## 2010-09-15 NOTE — Assessment & Plan Note (Signed)
Severe persistent asthma driven by chronic sinusitis with prior colonization of Pseudomonas in sinuses now improved status post Avelox prescription Plan No further antibiotics are indicated Maintain inhaled medications as currently prescribed Continue saline rinse with vigorous rinsing the right nares

## 2010-09-15 NOTE — Progress Notes (Signed)
Subjective:    Patient ID: [name], female    DOB: 1955-12-14, 55 y.o.   MRN: [id number]  HPI  55 y.o.WF  08/11/10 In hosp twice 6/12 for SVC clot and light headedness started.  Cough spell and went black.  Notes more dyspnea and cough .   Notes no real pndrip.  Notes some wheeze.  Last chemo was 3/12. Rx every 6months.  Saw ENT:  Sinuses seemed clear.  Asthma History:   Symptoms  Daily         Nighttime Awakenings  3-4/month         Asthma interference with normal activity  Minor limitations         SABA use (not for EIB)  > 2 days/wk--not > 1 x/day         Risk: Exacerbations requiring oral systemic steroids  0-1 / year          [facility] 05/26/10: 1. 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 Status post tissue plasminogen  activator with resolution of symptoms.  2. Subtherapeutic Coumadin level.  3. History of deep vein thrombosis.  4. History of high-grade B-cell lymphoma, status post chemotherapy.  5. History of sinusitis.  6. Has recent PET scan which did show hypermetabolic activity at the  posterior nasopharynx, is scheduled to see Dr.   06/07/10 Hosp 1. Superior vena cava thrombosis.  2. Sinusitis.  3. Bronchitis.  4. Asthma.  5. History of deep vein thrombosis.  6. Cervical myelopathy.  7. History of high-grade B-cell lymphoma status post chemotherapy.  8. History of abnormal hypermetabolic activity at the posterior  nasopharynx per PET scan.  06/07/10 BJ:YNWGN IMPRESSION:  No acute abnormalities demonstrated in the chest. No evidence of  significant lymphadenopathy. Again demonstrated are atresia of the  left jugular vein and focal clot or fibrin sheath around the distal  central venous catheter causing near occlusion of the superior vena  cava with associated collaterals.   6/4/ CT sinus IMPRESSION:  1. Extensive paranasal sinus disease.  2.  Small calculus noted than the right submandibular gland.  3. No acute bony findings and no adenopathy.  4. Fullness in the left piriform sinus. This is incompletely  evaluated. Direct visualization is suggested.  09/15/2010 After last ov got better on cipro/pred then  got better but worse  And on cxr  Worse and rx avelox per onc. Just finished Just started Chemo again.  ? pna vs lymphoma issue 8/31 cxr: IMPRESSION:  Emphysematous and bronchitic changes with bibasilar atelectasis and  question minimal lingular infiltrate   Since off avelox no more fever and sweats . Cough is better but still present.  Gets lightheaded if cough spells    Review of Systems  Constitutional:   No  weight loss, night sweats,  Fevers, chills, fatigue, lassitude. HEENT:   No headaches,  Difficulty swallowing,  Tooth/dental problems,  Sore throat,                No sneezing, itching, ear ache, nasal congestion, post nasal drip,   CV:  No chest pain,  Orthopnea, PND, swelling in lower extremities, anasarca, dizziness, palpitations  GI  No heartburn, indigestion, abdominal pain, nausea, vomiting, diarrhea, change in bowel habits, loss of appetite  Resp: No shortness of breath with exertion or at rest.  No excess mucus, no productive cough,  No non-productive cough,  No  coughing up of blood.  No change in color of mucus.  No wheezing.  No chest wall deformity  Skin: no rash or lesions.  GU: no dysuria, change in color of urine, no urgency or frequency.  No flank pain.  MS:  No joint pain or swelling.  No decreased range of motion.  No back pain.  Psych:  No change in mood or affect. No depression or anxiety.  No memory loss.     Objective:   Physical Exam  Filed Vitals:   09/15/10 1623  BP: 122/86  Pulse: 62  Temp: 98.3 F (36.8 C)  TempSrc: Oral  Height: 5\' 2"  (1.575 m)  Weight: 172 lb 6.4 oz (78.2 kg)  SpO2: 96%    Gen: Pleasant, well-nourished, in no distress,  normal affect  ENT: No  lesions,  mouth clear,  oropharynx clear, no postnasal drip, mild purulence and right nares  Neck: No JVD, no TMG, no carotid bruits  Lungs: No use of accessory muscles, no dullness to percussion, clear without rales or rhonchi  Cardiovascular: RRR, heart sounds normal, no murmur or gallops, no peripheral edema  Abdomen: soft and NT, no HSM,  BS normal  Musculoskeletal: No deformities, no cyanosis or clubbing  Neuro: alert, non focal  Skin: Warm, no lesions or rashes  09/03/2010 chest x-ray Mild left lingular infiltrate    Assessment & Plan:   EXTRINSIC ASTHMA, UNSPECIFIED Severe persistent asthma driven by chronic sinusitis with prior colonization of Pseudomonas in sinuses now improved status post Avelox prescription Plan No further antibiotics are indicated Maintain inhaled medications as currently prescribed Continue saline rinse with vigorous rinsing the right nares     Updated Medication List Outpatient Encounter Prescriptions as of 09/15/2010  Medication Sig Dispense Refill  . acyclovir (ZOVIRAX) 200 MG capsule 2 capsules twice per day       . albuterol (PROVENTIL) (2.5 MG/3ML) 0.083% nebulizer solution Take 2.5 mg by nebulization every 4 (four) hours as needed.  120 mL  6  . albuterol (VENTOLIN HFA) 108 (90 BASE) MCG/ACT inhaler Inhale 2 puffs into the lungs every 6 (six) hours as needed.        . citalopram (CELEXA) 20 MG tablet Take 20 mg by mouth every morning.        [name] Kitchen EPINEPHrine (EPIPEN IJ) As needed for severe allergic reaction       . furosemide (LASIX) 20 MG tablet Take 1 tablet by mouth as needed.      . Mometasone Furo-Formoterol Fum 200-5 MCG/ACT AERO Inhale 2 Inhalers into the lungs 2 (two) times daily.  1 Inhaler  6  . omeprazole (PRILOSEC) 20 MG capsule Take 1 capsule (20 mg total) by mouth 2 (two) times daily.  60 capsule  6  . potassium chloride 40 MEQ/15ML (20%) LIQD 2 tablespoons twice daily       . predniSONE (DELTASONE) 10 MG tablet Take 5 mg by  mouth daily.        . simvastatin (ZOCOR) 80 MG tablet Take 80 mg by mouth at bedtime.        [name] Kitchen warfarin (COUMADIN) 1 MG tablet Take 1 mg by mouth as directed.        . warfarin (COUMADIN) 10 MG tablet Take by mouth daily. As directed       . warfarin (COUMADIN) 5 MG tablet Take 5 mg by mouth as directed.        . zafirlukast (ACCOLATE) 20 MG tablet Take 20 mg by mouth  2 (two) times daily.        [name] Kitchen DISCONTD: albuterol (PROVENTIL) (2.5 MG/3ML) 0.083% nebulizer solution Take 2.5 mg by nebulization every 4 (four) hours as needed.        [name] Kitchen DISCONTD: Mometasone Furo-Formoterol Fum 200-5 MCG/ACT AERO Inhale 2 Inhalers into the lungs 2 (two) times daily.        [name] Kitchen DISCONTD: omeprazole (PRILOSEC) 20 MG capsule Take 20 mg by mouth 2 (two) times daily.        [name] Kitchen DISCONTD: predniSONE (DELTASONE) 10 MG tablet Take 4 for two days, three for two days, two for two days, then one for two days and  Then 1/2 tab daily and stay   60 tablet  6

## 2010-09-17 ENCOUNTER — Other Ambulatory Visit (HOSPITAL_COMMUNITY): Payer: Self-pay | Admitting: Oncology

## 2010-09-17 ENCOUNTER — Encounter (HOSPITAL_BASED_OUTPATIENT_CLINIC_OR_DEPARTMENT_OTHER): Payer: BC Managed Care – PPO

## 2010-09-17 VITALS — BP 111/71 | HR 69 | Temp 98.5°F | Ht 62.0 in | Wt 171.4 lb

## 2010-09-17 DIAGNOSIS — Z5112 Encounter for antineoplastic immunotherapy: Secondary | ICD-10-CM

## 2010-09-17 DIAGNOSIS — C8589 Other specified types of non-Hodgkin lymphoma, extranodal and solid organ sites: Secondary | ICD-10-CM

## 2010-09-17 MED ORDER — HEPARIN SOD (PORK) LOCK FLUSH 100 UNIT/ML IV SOLN
INTRAVENOUS | Status: AC
  Administered 2010-09-17: 500 [IU] via INTRAVENOUS
  Filled 2010-09-17: qty 5

## 2010-09-17 MED ORDER — SODIUM CHLORIDE 0.9 % IV SOLN
Freq: Once | INTRAVENOUS | Status: AC
  Administered 2010-09-17: 09:00:00 via INTRAVENOUS

## 2010-09-17 MED ORDER — SODIUM CHLORIDE 0.9 % IJ SOLN
10.0000 mL | INTRAMUSCULAR | Status: DC | PRN
  Administered 2010-09-17: 10 mL
  Filled 2010-09-17: qty 10

## 2010-09-17 MED ORDER — SODIUM CHLORIDE 0.9 % IV SOLN
375.0000 mg/m2 | Freq: Once | INTRAVENOUS | Status: AC
  Administered 2010-09-17: 700 mg via INTRAVENOUS
  Filled 2010-09-17: qty 70

## 2010-09-17 MED ORDER — HEPARIN SOD (PORK) LOCK FLUSH 100 UNIT/ML IV SOLN
500.0000 [IU] | Freq: Once | INTRAVENOUS | Status: AC
  Administered 2010-09-17: 500 [IU] via INTRAVENOUS
  Filled 2010-09-17: qty 5

## 2010-09-17 NOTE — Progress Notes (Signed)
Remicade started at 50 ml/hr for 30 mins. Then increased to 173ml/hr for 30 mins.  17ml/hr for 30 mins.  Finally 200 ml/hr until infusion finished. Tolerated infusion well.

## 2010-09-20 ENCOUNTER — Telehealth (HOSPITAL_COMMUNITY): Payer: Self-pay | Admitting: *Deleted

## 2010-09-20 ENCOUNTER — Other Ambulatory Visit (HOSPITAL_COMMUNITY): Payer: Self-pay | Admitting: Oncology

## 2010-09-20 NOTE — Telephone Encounter (Signed)
Message left with patient to call if she was having any problems or questions from chemo on Friday.

## 2010-09-24 ENCOUNTER — Encounter (HOSPITAL_BASED_OUTPATIENT_CLINIC_OR_DEPARTMENT_OTHER): Payer: BC Managed Care – PPO

## 2010-09-24 VITALS — BP 135/89 | HR 61 | Temp 98.0°F | Ht 62.0 in | Wt 172.2 lb

## 2010-09-24 DIAGNOSIS — Z5112 Encounter for antineoplastic immunotherapy: Secondary | ICD-10-CM

## 2010-09-24 DIAGNOSIS — C8589 Other specified types of non-Hodgkin lymphoma, extranodal and solid organ sites: Secondary | ICD-10-CM

## 2010-09-24 MED ORDER — SODIUM CHLORIDE 0.9 % IJ SOLN
INTRAMUSCULAR | Status: AC
  Administered 2010-09-24: 10 mL
  Filled 2010-09-24: qty 10

## 2010-09-24 MED ORDER — SODIUM CHLORIDE 0.9 % IV SOLN
375.0000 mg/m2 | Freq: Once | INTRAVENOUS | Status: AC
  Administered 2010-09-24: 700 mg via INTRAVENOUS
  Filled 2010-09-24: qty 70

## 2010-09-24 MED ORDER — HEPARIN SOD (PORK) LOCK FLUSH 100 UNIT/ML IV SOLN
INTRAVENOUS | Status: AC
  Filled 2010-09-24: qty 5

## 2010-09-24 MED ORDER — SODIUM CHLORIDE 0.9 % IJ SOLN
10.0000 mL | INTRAMUSCULAR | Status: DC | PRN
  Administered 2010-09-24: 10 mL
  Filled 2010-09-24: qty 10

## 2010-09-24 MED ORDER — HEPARIN SOD (PORK) LOCK FLUSH 100 UNIT/ML IV SOLN
500.0000 [IU] | Freq: Once | INTRAVENOUS | Status: DC | PRN
  Filled 2010-09-24: qty 5

## 2010-09-24 MED ORDER — SODIUM CHLORIDE 0.9 % IV SOLN
Freq: Once | INTRAVENOUS | Status: AC
  Administered 2010-09-24: 09:00:00 via INTRAVENOUS

## 2010-09-24 NOTE — Progress Notes (Signed)
1039m  Rituxan 700 mg begun at 45 ml/hr for 30 min Rate increased to 90 ml per hr for 30 min at 1035 1105 rituxan rate increased to 135 ml/hr for 30 min Final Rituxan rate 149ml/hr until complete. Tolerated infusion well.

## 2010-09-24 NOTE — Progress Notes (Signed)
Stated she took benedryl and tylenol at home prior to arrival for rituxan.

## 2010-09-27 ENCOUNTER — Telehealth (HOSPITAL_COMMUNITY): Payer: Self-pay | Admitting: *Deleted

## 2010-09-27 NOTE — Telephone Encounter (Signed)
Message left to call if having any problems.

## 2010-09-30 ENCOUNTER — Other Ambulatory Visit (HOSPITAL_COMMUNITY): Payer: Self-pay | Admitting: Oncology

## 2010-09-30 DIAGNOSIS — J069 Acute upper respiratory infection, unspecified: Secondary | ICD-10-CM

## 2010-09-30 MED ORDER — AMOXICILLIN-POT CLAVULANATE 875-125 MG PO TABS: 1.0000 | ORAL_TABLET | Freq: Two times a day (BID) | ORAL | Status: AC

## 2010-10-01 ENCOUNTER — Encounter (HOSPITAL_BASED_OUTPATIENT_CLINIC_OR_DEPARTMENT_OTHER): Payer: BC Managed Care – PPO

## 2010-10-01 VITALS — BP 124/79 | HR 89 | Temp 99.0°F | Ht 62.0 in | Wt 171.2 lb

## 2010-10-01 DIAGNOSIS — C8589 Other specified types of non-Hodgkin lymphoma, extranodal and solid organ sites: Secondary | ICD-10-CM

## 2010-10-01 DIAGNOSIS — Z5112 Encounter for antineoplastic immunotherapy: Secondary | ICD-10-CM

## 2010-10-01 MED ORDER — SODIUM CHLORIDE 0.9 % IJ SOLN
10.0000 mL | INTRAMUSCULAR | Status: DC | PRN
  Administered 2010-10-01: 10 mL
  Filled 2010-10-01: qty 10

## 2010-10-01 MED ORDER — SODIUM CHLORIDE 0.9 % IV SOLN
Freq: Once | INTRAVENOUS | Status: AC
  Administered 2010-10-01: 09:00:00 via INTRAVENOUS

## 2010-10-01 MED ORDER — SODIUM CHLORIDE 0.9 % IV SOLN
375.0000 mg/m2 | Freq: Once | INTRAVENOUS | Status: AC
  Administered 2010-10-01: 700 mg via INTRAVENOUS
  Filled 2010-10-01: qty 70

## 2010-10-01 MED ORDER — SODIUM CHLORIDE 0.9 % IJ SOLN
INTRAMUSCULAR | Status: AC
  Administered 2010-10-01: 10 mL
  Filled 2010-10-01: qty 10

## 2010-10-01 NOTE — Progress Notes (Signed)
rituxan 700 mg started at 100mg  /hr and increased by 100 mg/hr up to 400 mg/hr.1210 Infusion concluded. Tolerated well.

## 2010-10-02 ENCOUNTER — Telehealth (HOSPITAL_COMMUNITY): Payer: Self-pay

## 2010-10-02 NOTE — Telephone Encounter (Signed)
Chemotherapy Call back:  Spoke with daughter, patient is doing fine, has experienced no problems with chemotherapy.

## 2010-10-19 ENCOUNTER — Telehealth (HOSPITAL_COMMUNITY): Payer: Self-pay | Admitting: Oncology

## 2010-10-19 ENCOUNTER — Telehealth (HOSPITAL_COMMUNITY): Payer: Self-pay | Admitting: *Deleted

## 2010-10-19 NOTE — Telephone Encounter (Signed)
Spoke to patient.  She reports evening temperatures nearly reaching 101.  She reports muscle soreness.  She denies sore throat, cough, chills, night sweats, sputum production. I have explained to her that this may be a virus.  I have asked her to call the clinic if she begins to feel worse.  At this time an antibiotic is not indicated.

## 2010-10-19 NOTE — Telephone Encounter (Signed)
Pt has stated that she has had a fever of almost 101 x 4 days in the evening. Only running a fever in the evening. This am she is 99. No other symptoms reported such as cough, sick feeling, or UTI like symptoms. She is scheduled for a PET on 11/1. Do you want any blood work today?

## 2010-11-04 ENCOUNTER — Encounter (HOSPITAL_COMMUNITY)
Admission: RE | Admit: 2010-11-04 | Discharge: 2010-11-04 | Disposition: A | Discharge status: Home / Self Care | Payer: BC Managed Care – PPO | Source: Ambulatory Visit | Attending: Oncology | Admitting: Oncology

## 2010-11-04 DIAGNOSIS — R911 Solitary pulmonary nodule: Secondary | ICD-10-CM | POA: Insufficient documentation

## 2010-11-04 DIAGNOSIS — C8589 Other specified types of non-Hodgkin lymphoma, extranodal and solid organ sites: Secondary | ICD-10-CM | POA: Insufficient documentation

## 2010-11-04 DIAGNOSIS — C8590 Non-Hodgkin lymphoma, unspecified, unspecified site: Secondary | ICD-10-CM

## 2010-11-04 MED ORDER — FLUDEOXYGLUCOSE F - 18 (FDG) INJECTION
19.0000 | Freq: Once | INTRAVENOUS | Status: AC | PRN
  Administered 2010-11-04: 19 via INTRAVENOUS

## 2010-11-05 ENCOUNTER — Encounter (HOSPITAL_COMMUNITY): Payer: Self-pay | Admitting: Oncology

## 2010-11-05 ENCOUNTER — Encounter (HOSPITAL_COMMUNITY): Payer: BC Managed Care – PPO | Attending: Oncology | Admitting: Oncology

## 2010-11-05 VITALS — BP 130/81 | HR 58 | Temp 97.8°F | Ht 62.0 in | Wt 173.2 lb

## 2010-11-05 DIAGNOSIS — R05 Cough: Secondary | ICD-10-CM

## 2010-11-05 DIAGNOSIS — R058 Other specified cough: Secondary | ICD-10-CM

## 2010-11-05 DIAGNOSIS — R509 Fever, unspecified: Secondary | ICD-10-CM | POA: Insufficient documentation

## 2010-11-05 DIAGNOSIS — R059 Cough, unspecified: Secondary | ICD-10-CM

## 2010-11-05 DIAGNOSIS — C8589 Other specified types of non-Hodgkin lymphoma, extranodal and solid organ sites: Secondary | ICD-10-CM | POA: Insufficient documentation

## 2010-11-05 LAB — CBC
HCT: 37.7 % (ref 36.0–46.0)
Hemoglobin: 12.3 g/dL (ref 12.0–15.0)
MCV: 94.3 fL (ref 78.0–100.0)
RDW: 14.6 % (ref 11.5–15.5)
WBC: 6.8 10*3/uL (ref 4.0–10.5)

## 2010-11-05 LAB — DIFFERENTIAL
Basophils Absolute: 0 10*3/uL (ref 0.0–0.1)
Eosinophils Relative: 1 % (ref 0–5)
Lymphocytes Relative: 14 % (ref 12–46)
Monocytes Absolute: 0.4 10*3/uL (ref 0.1–1.0)
Monocytes Relative: 5 % (ref 3–12)
Neutro Abs: 5.4 10*3/uL (ref 1.7–7.7)

## 2010-11-05 LAB — LACTATE DEHYDROGENASE: LDH: 204 U/L (ref 94–250)

## 2010-11-05 LAB — COMPREHENSIVE METABOLIC PANEL
AST: 13 U/L (ref 0–37)
BUN: 12 mg/dL (ref 6–23)
CO2: 31 mEq/L (ref 19–32)
Calcium: 9.6 mg/dL (ref 8.4–10.5)
Chloride: 100 mEq/L (ref 96–112)
Creatinine, Ser: 1.05 mg/dL (ref 0.50–1.10)
GFR calc Af Amer: 68 mL/min — ABNORMAL LOW (ref >90)
GFR calc non Af Amer: 59 mL/min — ABNORMAL LOW (ref >90)
Glucose, Bld: 101 mg/dL — ABNORMAL HIGH (ref 70–99)
Total Bilirubin: 0.3 mg/dL (ref 0.3–1.2)

## 2010-11-05 LAB — SEDIMENTATION RATE: Sed Rate: 58 mm/hr — ABNORMAL HIGH (ref 0–22)

## 2010-11-05 NOTE — Patient Instructions (Signed)
[facility]  Discharge Instructions  RECOMMENDATIONS MADE BY THE CONSULTANT AND ANY TEST RESULTS WILL BE SENT TO YOUR REFERRING DOCTOR.   EXAM FINDINGS BY MD TODAY AND SIGNS AND SYMPTOMS TO REPORT TO CLINIC OR PRIMARY MD: Not sure what is going on with the fevers. We will check some labs today.     SPECIAL INSTRUCTIONS/FOLLOW-UP: Return to Clinic in 3 months to see PA   I acknowledge that I have been informed and understand all the instructions given to me and received a copy. I do not have any more questions at this time, but understand that I may call the [facility]ty [facility] at [facility] at [phone] during business hours should I have any further questions or need assistance in obtaining follow-up care.    __________________________________________  _____________  __________ Signature of Patient or Authorized Representative            Date                   Time    __________________________________________ Nurse's Signature

## 2010-11-05 NOTE — Progress Notes (Signed)
Kirk Ruths, MD [address] [place] [address]  1. Fever  Luteinizing hormone, Follicle stimulating hormone, Luteinizing hormone, Follicle stimulating hormone  2. NON-HODGKIN'S LYMPHOMA  Luteinizing hormone, Follicle stimulating hormone, CBC, Differential, Comprehensive metabolic panel, Lactate dehydrogenase, Sedimentation rate, Luteinizing hormone, Follicle stimulating hormone, CBC, Differential, Comprehensive metabolic panel, Lactate dehydrogenase, Sedimentation rate  3. Dry cough      CURRENT THERAPY: S/P R-CHOP x 4 cycles with intrathecal chemotherapy during cycles 3 and 4 with methotrexate and Solu-Cortef.  Treatment was stopped due to Pseudomonas sepsis after cycles 2 and 4 which was very severe.   INTERVAL HISTORY: [name] 55 y.o. female returns for  regular  visit for followup of  Diffuse large B-Cell Lymphoma.  The patient continues to have instances of fevers.  She does not have any other symptoms.  She reports that she feels well.  When she gets these fevers, recorded up to 101 degrees F, she reports perspiration that she must wipe off.  She denies a cough productive of sputum, nasal discharge, or other signs of an infection.  She reports night sweats that cause her bed clothes to become damp.  She denies having to re-cloth due to her sweats at night.  She denies changing the bed sheets or pillow case.  The patient also reports a dry, "hacking" cough.  This is non-productive.  She will report this to her pulmonologist.  No indications at this time for antibiotic therapy.  I personally reviewed and went over radiographic studies with the patient.  Her recent PET scan did not reveal any signs of lymphoma recurrence.  She is of course pleased with this report.  The patient reports that she had a hysterectomy many years ago.  However, she does continue to have her ovaries.  Will check a LH and FSH.  Past Medical History  Diagnosis Date  . Non  Hodgkin's lymphoma   . Allergic rhinitis   . GERD (gastroesophageal reflux disease)   . Asthma   . Chronic sinusitis   . Respiratory failure   . Cavitary lung disease     has NON-HODGKIN'S LYMPHOMA; GASTRIC POLYP; HYPERLIPIDEMIA; HYPERTENSION; SINUSITIS, CHRONIC NOS; ALLERGIC RHINITIS; VOCAL CORD DISORDER; OBSTRUCTIVE CHRONIC BRONCHITIS; EXTRINSIC ASTHMA, UNSPECIFIED; GERD; DIABETES MELLITUS, BORDERLINE; and HYPOGAMMAGLOBULINEMIA on her problem list.     is allergic to meperidine hcl and montelukast sodium.  [name] does not currently have medications on file.  Past Surgical History  Procedure Date  . Vesicovaginal fistula closure w/ tah   . Nasal sinus surgery   . Neck surgery     x 2   . Breast surgery   . Portacath placement 7/11    Removed 6/12    Denies any headaches, dizziness, double vision, fevers, chills, night sweats, nausea, vomiting, diarrhea, constipation, chest pain, heart palpitations, shortness of breath, blood in stool, black tarry stool, urinary pain, urinary burning, urinary frequency, hematuria.   PHYSICAL EXAMINATION  ECOG PERFORMANCE STATUS: 0 - Asymptomatic  Filed Vitals:   11/05/10 1452  BP: 130/81  Pulse: 58  Temp: 97.8 F (36.6 C)    GENERAL:alert, no distress, well nourished, well developed, comfortable, cooperative and smiling SKIN: skin color, texture, turgor are normal HEAD: Normocephalic EYES: normal EARS: External ears normal OROPHARYNX:mucous membranes are moist  NECK: supple, no adenopathy, thyroid normal size, non-tender, without nodularity, no stridor, non-tender, trachea midline LYMPH:  no palpable lymphadenopathy BREAST:not examined LUNGS: clear to auscultation and percussion HEART: regular rate & rhythm,  no murmurs, no gallops, S1 normal and S2 normal ABDOMEN:abdomen soft, non-tender and normal bowel sounds BACK: Back symmetric, no curvature. EXTREMITIES:less then 2 second capillary refill, no joint deformities, effusion,  or inflammation, no edema, no skin discoloration, no clubbing, no cyanosis  NEURO: alert & oriented x 3 with fluent speech, no focal motor/sensory deficits, gait normal   PENDING LABS: LH, FSH, CBC diff, CMET, LDH, ESR   RADIOGRAPHIC STUDIES:  Nm Pet Image Restag (ps) Skull Base To Thigh  11/04/2010  *RADIOLOGY REPORT*  Clinical Data: Subsequent treatment strategy for lymphoma.  NUCLEAR MEDICINE PET SKULL BASE TO THIGH  Fasting Blood Glucose:  89  Technique:  19.0 mCi F-18 FDG was injected intravenously via the right side.  Full-ring PET imaging was performed from the skull base through the mid-thighs 63  minutes after injection.  CT data was obtained and used for attenuation correction and anatomic localization only.  (This was not acquired as a diagnostic CT examination.)  Comparison: PET CT scan 05/03/2010  Findings:  Neck:No hypermetabolic nodes in the neck.  Chest:No hypermetabolic mediastinal or hilar nodes.  No suspicious pulmonary nodules.  Multiple venous collaterals are noted on the anterior chest wall.  Stable 6 mm nodule in the right middle lobe which is unchanged from CT of 01/23/2010.   Second nodule in the inferior left lower lobe is unchanged from PET CT of 05/03/2010.  Abdomen / Pelvis:There is a hypodense lesion in the right hepatic lobe (image 40) which does not have associated metabolic activity. The spleen is normal.  No abdominal or pelvic metabolic lymph nodes or lymphadenopathy.  Skeleton:No focal hypermetabolic activity to suggest skeletal metastasis.  IMPRESSION: No evidence of lymphoma recurrence.  Stable pulmonary nodules.  Original Report Authenticated By: Genevive Bi, M.D.     ASSESSMENT:  1. Diffuse large b-cell non-hodgkin's lymphoma, no sign of recurrence on PET scan 11/04/10 2. Fevers, reaching 101 F, but feeling well.  3. Dry cough 4. Hypogammaglobulinemia 5. Consider viral bronchitis: fevers and dry cough 6. Poor sleeping secondary to cough   PLAN:  1.  Will try Delsym OTC for cough.  Patient has tessalon pearls at home. 2. Will try benadryl at HS 3. Patient had tongue swelling with codeine in the past.  Will therefore avoid Hycodan.   4. Lab work today: CBC diff, CMET, LDH, ESR, LH, FSH 5. Return in 3 months for follow-up. 6. Patient scheduled for maintenance Rituxan in March 2013. 7. I personally reviewed and went over radiographic studies with the patient.   All questions were answered. The patient knows to call the clinic with any problems, questions or concerns. We can certainly see the patient much sooner if necessary.   Ayrton Mcvay

## 2010-11-06 LAB — LUTEINIZING HORMONE: LH: 26.4 m[IU]/mL

## 2010-11-09 ENCOUNTER — Telehealth (HOSPITAL_COMMUNITY): Payer: Self-pay

## 2010-11-09 NOTE — Telephone Encounter (Signed)
Call patient and ask her to come to the clinic when she has a documented fever. We would like to due 2 blind blood cultures and a UA when she has a fever. Let me know and I will gladly put in the orders./Per PA [phone] message left for Loral to call clinic.

## 2010-11-10 ENCOUNTER — Telehealth (HOSPITAL_COMMUNITY): Payer: Self-pay | Admitting: *Deleted

## 2010-11-10 NOTE — Telephone Encounter (Signed)
Message copied by Dennie Maizes on Wed Nov 10, 2010 12:24 PM ------      Message from: Ellouise Newer III      Created: Mon Nov 08, 2010  5:25 PM       Call patient and ask her to come to the clinic when she has a documented fever.  We would like to due 2 blind blood cultures and a UA when she has a fever.  Let me know and I will gladly put in the orders.

## 2010-11-23 ENCOUNTER — Ambulatory Visit (INDEPENDENT_AMBULATORY_CARE_PROVIDER_SITE_OTHER): Payer: BC Managed Care – PPO | Admitting: Critical Care Medicine

## 2010-11-23 ENCOUNTER — Encounter: Payer: Self-pay | Admitting: Critical Care Medicine

## 2010-11-23 VITALS — BP 110/86 | HR 78 | Temp 98.2°F | Ht 62.0 in | Wt 174.0 lb

## 2010-11-23 DIAGNOSIS — J45909 Unspecified asthma, uncomplicated: Secondary | ICD-10-CM

## 2010-11-23 DIAGNOSIS — J329 Chronic sinusitis, unspecified: Secondary | ICD-10-CM

## 2010-11-23 NOTE — Patient Instructions (Signed)
A CT of the sinuses will be obtained at [facility] Treatment changes will be made base upon CT results Return one month in any case No med changes for now

## 2010-11-23 NOTE — Progress Notes (Signed)
Subjective:    Patient ID: [name], female    DOB: 09-27-1955, 55 y.o.   MRN: [id number]  HPI  55 y.o.WF  08/11/10 In hosp twice 6/12 for SVC clot and light headedness started.  Cough spell and went black.  Notes more dyspnea and cough .   Notes no real pndrip.  Notes some wheeze.  Last chemo was 3/12. Rx every 6months.  Saw ENT:  Sinuses seemed clear.  Asthma History:   Symptoms  Daily         Nighttime Awakenings  3-4/month         Asthma interference with normal activity  Minor limitations         SABA use (not for EIB)  > 2 days/wk--not > 1 x/day         Risk: Exacerbations requiring oral systemic steroids  0-1 / year          [facility] 05/26/10: 1. 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 Status post tissue plasminogen  activator with resolution of symptoms.  2. Subtherapeutic Coumadin level.  3. History of deep vein thrombosis.  4. History of high-grade B-cell lymphoma, status post chemotherapy.  5. History of sinusitis.  6. Has recent PET scan which did show hypermetabolic activity at the  posterior nasopharynx, is scheduled to see Dr.   06/07/10 Hosp 1. Superior vena cava thrombosis.  2. Sinusitis.  3. Bronchitis.  4. Asthma.  5. History of deep vein thrombosis.  6. Cervical myelopathy.  7. History of high-grade B-cell lymphoma status post chemotherapy.  8. History of abnormal hypermetabolic activity at the posterior  nasopharynx per PET scan.  06/07/10 [id number] IMPRESSION:  No acute abnormalities demonstrated in the chest. No evidence of  significant lymphadenopathy. Again demonstrated are atresia of the  left jugular vein and focal clot or fibrin sheath around the distal  central venous catheter causing near occlusion of the superior vena  cava with associated collaterals.   6/4/ CT sinus IMPRESSION:  1. Extensive paranasal sinus disease.  2.  Small calculus noted than the right submandibular gland.  3. No acute bony findings and no adenopathy.  4. Fullness in the left piriform sinus. This is incompletely  evaluated. Direct visualization is suggested.  9/12 After last ov got better on cipro/pred then  got better but worse  And on cxr  Worse and rx avelox per onc. Just finished Just started Chemo again.  ? pna vs lymphoma issue 8/31 cxr: IMPRESSION:  Emphysematous and bronchitic changes with bibasilar atelectasis and  question minimal lingular infiltrate   Since off avelox no more fever and sweats . Cough is better but still present.  Gets lightheaded if cough spells  11/23/2010 Notings severe coughing paroxysms.  Noting since 9/12 OV.  Cough is dry.  In AM has yellow mucus. Notes some wheeze with cough.  NHL:  Last ChemoRx 9/12. Recent PET scan neg.  Running lowgrade fever.  Past Medical History  Diagnosis Date  . Non Hodgkin's lymphoma   . Allergic rhinitis   . GERD (gastroesophageal reflux disease)   . Asthma   . Chronic sinusitis   . Respiratory failure   . Cavitary lung disease      Family History  Problem Relation Age of Onset  . Emphysema Mother   . Allergies Father   . Asthma Father     as a child  .  Leukemia Maternal Grandmother   . Diabetes Brother   . Stroke Mother      History   Social History  . Marital Status: Divorced    Spouse Name: N/A    Number of Children: 3  . Years of Education: N/A   Occupational History  . [employer] Service Manager    Social History Main Topics  . Smoking status: Never Smoker   . Smokeless tobacco: Never Used  . Alcohol Use: No  . Drug Use: Not on file  . Sexually Active: Not on file   Other Topics Concern  . Not on file   Social History Narrative  . No narrative on file     Allergies  Allergen Reactions  . Meperidine Hcl   . Montelukast Sodium      Outpatient Prescriptions Prior to Visit  Medication Sig Dispense Refill  . acyclovir (ZOVIRAX)  200 MG capsule 2 capsules twice per day       . albuterol (PROVENTIL) (2.5 MG/3ML) 0.083% nebulizer solution Take 2.5 mg by nebulization every 4 (four) hours as needed.  120 mL  6  . albuterol (VENTOLIN HFA) 108 (90 BASE) MCG/ACT inhaler Inhale 2 puffs into the lungs every 6 (six) hours as needed.        . citalopram (CELEXA) 20 MG tablet Take 20 mg by mouth every morning.        [name] Kitchen EPINEPHrine (EPIPEN IJ) As needed for severe allergic reaction       . furosemide (LASIX) 20 MG tablet Take 1 tablet by mouth as needed.      . Mometasone Furo-Formoterol Fum 200-5 MCG/ACT AERO Inhale 2 Inhalers into the lungs 2 (two) times daily.  1 Inhaler  6  . omeprazole (PRILOSEC) 20 MG capsule Take 1 capsule (20 mg total) by mouth 2 (two) times daily.  60 capsule  6  . potassium chloride 40 MEQ/15ML (20%) LIQD 2 tablespoons twice daily       . predniSONE (DELTASONE) 10 MG tablet Take 5 mg by mouth daily.        . simvastatin (ZOCOR) 80 MG tablet Take 80 mg by mouth at bedtime.        [name] Kitchen warfarin (COUMADIN) 10 MG tablet Take by mouth daily. As directed.  Currently taking 6mg  doses.      . zafirlukast (ACCOLATE) 20 MG tablet Take 20 mg by mouth 2 (two) times daily.           Review of Systems  Constitutional:   No  weight loss, night sweats,  Fevers, chills, fatigue, lassitude. HEENT:   No headaches,  Difficulty swallowing,  Tooth/dental problems,  Sore throat,                No sneezing, itching, ear ache, nasal congestion, post nasal drip,   CV:  No chest pain,  Orthopnea, PND, swelling in lower extremities, anasarca, dizziness, palpitations  GI  No heartburn, indigestion, abdominal pain, nausea, vomiting, diarrhea, change in bowel habits, loss of appetite  Resp: No shortness of breath with exertion or at rest.  No excess mucus, no productive cough,  No non-productive cough,  No coughing up of blood.  No change in color of mucus.  No wheezing.  No chest wall deformity  Skin: no rash or lesions.  GU: no  dysuria, change in color of urine, no urgency or frequency.  No flank pain.  MS:  No joint pain or swelling.  No decreased range of motion.  No back  pain.  Psych:  No change in mood or affect. No depression or anxiety.  No memory loss.     Objective:   Physical Exam  Filed Vitals:   11/23/10 1625  BP: 110/86  Pulse: 78  Temp: 98.2 F (36.8 C)  TempSrc: Oral  Height: 5\' 2"  (1.575 m)  Weight: 174 lb (78.926 kg)  SpO2: 97%    Gen: Pleasant, well-nourished, in no distress,  normal affect  ENT: No lesions,  mouth clear,  oropharynx clear, no postnasal drip, mild purulence and right nares  Neck: No JVD, no TMG, no carotid bruits  Lungs: No use of accessory muscles, no dullness to percussion, exp wheeze  Cardiovascular: RRR, heart sounds normal, no murmur or gallops, no peripheral edema  Abdomen: soft and NT, no HSM,  BS normal  Musculoskeletal: No deformities, no cyanosis or clubbing  Neuro: alert, non focal  Skin: Warm, no lesions or rashes  09/03/2010 chest x-ray Mild left lingular infiltrate    Assessment & Plan:   EXTRINSIC ASTHMA, UNSPECIFIED Severe persistent asthma with chronic sinusitis and cyclical cough, low grade fever Plan CT Sinus, if air fluid levels will need prolonged Rx for pseudomonas in airway No change in inhaled or maintenance medications.       Updated Medication List Outpatient Encounter Prescriptions as of 11/23/2010  Medication Sig Dispense Refill  . acyclovir (ZOVIRAX) 200 MG capsule 2 capsules twice per day       . albuterol (PROVENTIL) (2.5 MG/3ML) 0.083% nebulizer solution Take 2.5 mg by nebulization every 4 (four) hours as needed.  120 mL  6  . albuterol (VENTOLIN HFA) 108 (90 BASE) MCG/ACT inhaler Inhale 2 puffs into the lungs every 6 (six) hours as needed.        . citalopram (CELEXA) 20 MG tablet Take 20 mg by mouth every morning.        [name] Kitchen EPINEPHrine (EPIPEN IJ) As needed for severe allergic reaction       . furosemide (LASIX)  20 MG tablet Take 1 tablet by mouth as needed.      . Mometasone Furo-Formoterol Fum 200-5 MCG/ACT AERO Inhale 2 Inhalers into the lungs 2 (two) times daily.  1 Inhaler  6  . omeprazole (PRILOSEC) 20 MG capsule Take 1 capsule (20 mg total) by mouth 2 (two) times daily.  60 capsule  6  . potassium chloride 40 MEQ/15ML (20%) LIQD 2 tablespoons twice daily       . predniSONE (DELTASONE) 10 MG tablet Take 5 mg by mouth daily.        . simvastatin (ZOCOR) 80 MG tablet Take 80 mg by mouth at bedtime.        [name] Kitchen warfarin (COUMADIN) 10 MG tablet Take by mouth daily. As directed.  Currently taking 6mg  doses.      . zafirlukast (ACCOLATE) 20 MG tablet Take 20 mg by mouth 2 (two) times daily.

## 2010-11-23 NOTE — Assessment & Plan Note (Signed)
Severe persistent asthma with chronic sinusitis and cyclical cough, low grade fever Plan CT Sinus, if air fluid levels will need prolonged Rx for pseudomonas in airway No change in inhaled or maintenance medications.

## 2010-11-30 ENCOUNTER — Telehealth: Payer: Self-pay | Admitting: Critical Care Medicine

## 2010-11-30 ENCOUNTER — Ambulatory Visit (HOSPITAL_COMMUNITY)
Admission: RE | Admit: 2010-11-30 | Discharge: 2010-11-30 | Disposition: A | Discharge status: Home / Self Care | Payer: BC Managed Care – PPO | Source: Ambulatory Visit | Attending: Critical Care Medicine | Admitting: Critical Care Medicine

## 2010-11-30 DIAGNOSIS — J329 Chronic sinusitis, unspecified: Secondary | ICD-10-CM

## 2010-11-30 DIAGNOSIS — J321 Chronic frontal sinusitis: Secondary | ICD-10-CM | POA: Insufficient documentation

## 2010-11-30 DIAGNOSIS — Z9889 Other specified postprocedural states: Secondary | ICD-10-CM | POA: Insufficient documentation

## 2010-11-30 DIAGNOSIS — J320 Chronic maxillary sinusitis: Secondary | ICD-10-CM | POA: Insufficient documentation

## 2010-11-30 DIAGNOSIS — R51 Headache: Secondary | ICD-10-CM | POA: Insufficient documentation

## 2010-11-30 DIAGNOSIS — J323 Chronic sphenoidal sinusitis: Secondary | ICD-10-CM | POA: Insufficient documentation

## 2010-11-30 MED ORDER — CIPROFLOXACIN HCL 750 MG PO TABS: 750.0000 mg | ORAL_TABLET | Freq: Two times a day (BID) | ORAL | Status: AC

## 2010-11-30 NOTE — Telephone Encounter (Signed)
Pt returning PW's call & can be reached on her cell, [phone].  [name]

## 2010-11-30 NOTE — Telephone Encounter (Signed)
I spok to the pt and she knows CT result and to pick up Rx

## 2010-11-30 NOTE — Telephone Encounter (Signed)
CT Sinus shows severe pansinusitis I will send Rx to her pharmacy for cipro for 14days She needs an appt with Osborn Coho ASAP for eval.

## 2010-12-03 ENCOUNTER — Encounter (HOSPITAL_BASED_OUTPATIENT_CLINIC_OR_DEPARTMENT_OTHER): Payer: BC Managed Care – PPO | Admitting: Oncology

## 2010-12-03 VITALS — BP 111/76 | HR 67 | Temp 98.1°F | Ht 62.0 in | Wt 172.0 lb

## 2010-12-03 DIAGNOSIS — T148XXA Other injury of unspecified body region, initial encounter: Secondary | ICD-10-CM

## 2010-12-03 DIAGNOSIS — N6489 Other specified disorders of breast: Secondary | ICD-10-CM

## 2010-12-03 DIAGNOSIS — R58 Hemorrhage, not elsewhere classified: Secondary | ICD-10-CM

## 2010-12-03 NOTE — Progress Notes (Signed)
Subjective:  The patient was seen as a work-in today.  She reports that over Thanksgiving she noted a right sided neck node, located under the angel of the mandible was palpated.  This swelling and erythema was appreciated down right side of neck and chest.  This resolved on its own, and is not appreciated today.  The patient also notes a right sided breast nodule deep to a reddened area.  She denies any trauma.  She reports that it is tender to the touch.  She denies any nipple discharge or distortion.    She continues to note evening fevers of a maximum of 100.4 F.  She continues to feel well.  Objective:  BP 111/76  Pulse 67  Temp 98.1 F (36.7 C)  Ht 5\' 2"  (1.575 m)  Wt 172 lb (78.019 kg)  BMI 31.46 kg/m2 General:  Patient in no acute distress. She is A and O x 3.   HEENT: Atraumatic, normocephalic Neck: No anterior or posterior lymphadenopathy.  No tenderness to palpation.  Trachea midline. Lymph: No supra/infraclavicular lymphadenopathy. Cardiac: RRR without murmur rub or gallop.  No S3 or S4. Lungs: CTA B/L without wheezes rales or rhonchi Abdomen: +BS x 4 quadrants Breast: Right breast reveals an ecchymosis in the 12 o'clock position, just superior to areola with a soft, non-fixed, tender nodule measuring 0.75 cm deep to the ecchymosis.  Neuro: A and O x 3.  No focal deficits.  Radiology:  07/19/2010  DG SCREEN MAMMOGRAM BILATERAL  Bilateral CC and MLO view(s) were taken.  Technologist: Faith Little  DIGITAL SCREENING MAMMOGRAM WITH CAD:  There are scattered fibroglandular densities. No masses or malignant type calcifications are  identified. Compared with prior studies.  Images were processed with CAD.  IMPRESSION:  No specific mammographic evidence of malignancy. Next screening mammogram is recommended in one  year.  A result letter of this screening mammogram will be mailed directly to the patient.  ASSESSMENT: Negative - BI-RADS 1  Screening mammogram in 1  year.    11/04/2010  *RADIOLOGY REPORT*  Clinical Data: Subsequent treatment strategy for lymphoma.  NUCLEAR MEDICINE PET SKULL BASE TO THIGH  Fasting Blood Glucose: 89  Technique: 19.0 mCi F-18 FDG was injected intravenously via the  right side. Full-ring PET imaging was performed from the skull  base through the mid-thighs 63 minutes after injection. CT data  was obtained and used for attenuation correction and anatomic  localization only. (This was not acquired as a diagnostic CT  examination.)  Comparison: PET CT scan 05/03/2010  Findings:  Neck:No hypermetabolic nodes in the neck.  Chest:No hypermetabolic mediastinal or hilar nodes. No suspicious  pulmonary nodules. Multiple venous collaterals are noted on the  anterior chest wall.  Stable 6 mm nodule in the right middle lobe which is unchanged from  CT of 01/23/2010. Second nodule in the inferior left lower lobe  is unchanged from PET CT of 05/03/2010.  Abdomen / Pelvis:There is a hypodense lesion in the right hepatic  lobe (image 40) which does not have associated metabolic activity.  The spleen is normal. No abdominal or pelvic metabolic lymph nodes  or lymphadenopathy.  Skeleton:No focal hypermetabolic activity to suggest skeletal  metastasis.  IMPRESSION:  No evidence of lymphoma recurrence.  Stable pulmonary nodules.  Original Report Authenticated By: Genevive Bi, M.D.   Assessment/Plan: 1. Right sided breast ecchymosis with associated small hematoma measuring 0.75 cm.  Will ask the patient to return in 2-3 weeks to verify that it has resolved.  2. Right neck node and neck inflammation, unclear etiology, resolved spontaneously.  All questions were answered.  Patient knows to call the clinic with any questions or concerns.    Patient and plan discussed with Dr. Mariel Sleet and he is in agreement with the aforementioned.  Patient seen and examined by Dr. Mariel Sleet.  KEFALAS,THOMAS

## 2010-12-14 ENCOUNTER — Ambulatory Visit: Payer: BC Managed Care – PPO | Admitting: Critical Care Medicine

## 2010-12-23 ENCOUNTER — Encounter (HOSPITAL_COMMUNITY): Payer: BC Managed Care – PPO | Attending: Oncology | Admitting: Oncology

## 2010-12-23 ENCOUNTER — Encounter (HOSPITAL_COMMUNITY): Payer: Self-pay | Admitting: Oncology

## 2010-12-23 VITALS — BP 103/68 | HR 61 | Temp 98.3°F | Ht 62.0 in | Wt 175.2 lb

## 2010-12-23 DIAGNOSIS — N6489 Other specified disorders of breast: Secondary | ICD-10-CM

## 2010-12-23 DIAGNOSIS — C8589 Other specified types of non-Hodgkin lymphoma, extranodal and solid organ sites: Secondary | ICD-10-CM | POA: Insufficient documentation

## 2010-12-23 DIAGNOSIS — R509 Fever, unspecified: Secondary | ICD-10-CM | POA: Insufficient documentation

## 2010-12-23 NOTE — Patient Instructions (Signed)
[name]  [id number] 16-Apr-1955   [facility]  Discharge Instructions  RECOMMENDATIONS MADE BY THE CONSULTANT AND ANY TEST RESULTS WILL BE SENT TO YOUR REFERRING DOCTOR.   EXAM FINDINGS BY MD TODAY AND SIGNS AND SYMPTOMS TO REPORT TO CLINIC OR PRIMARY MD: Area in right breast seems to be smaller by exam by PA.  Bruise also seems to be resolving.  MEDICATIONS PRESCRIBED: none   INSTRUCTIONS GIVEN AND DISCUSSED: Other :  Let us know if the area changes or you are concerned about it.  SPECIAL INSTRUCTIONS/FOLLOW-UP: Return to Clinic on as scheduled for follow-up and chemotherapy   I acknowledge that I have been informed and understand all the instructions given to me and received a copy. I do not have any more questions at this time, but understand that I may call the Specialty Clinic at [facility] at [phone] during business hours should I have any further questions or need assistance in obtaining follow-up care.    __________________________________________  _____________  __________ Signature of Patient or Authorized Representative            Date                   Time    __________________________________________ Nurse's Signature

## 2010-12-23 NOTE — Progress Notes (Signed)
Kirk Ruths, MD [address] [place] [address]  1. NON-HODGKIN'S LYMPHOMA     CURRENT THERAPY:S/P R-CHOP x 4 cycles with intrathecal chemotherapy during cycles 3 and 4 with methotrexate and Solu-Cortef. Treatment was stopped due to Pseudomonas sepsis after cycles 2 and 4 which was very severe.  INTERVAL HISTORY: [name] 55 y.o. female returns for for followup of  A previous identified breast nodule and associated ecchymosis.    She reports that the ecchymosis has improved.  She reports that the nodule has decreased in size.  She denies any pain.   She continues to have Hot flashes.  She feels well.  She denies any other completes.  Complete ROS questioning is negative.   Past Medical History  Diagnosis Date  . Non Hodgkin's lymphoma   . Allergic rhinitis   . GERD (gastroesophageal reflux disease)   . Asthma   . Chronic sinusitis   . Respiratory failure   . Cavitary lung disease     has NON-HODGKIN'S LYMPHOMA; GASTRIC POLYP; HYPERLIPIDEMIA; HYPERTENSION; SINUSITIS, CHRONIC NOS; ALLERGIC RHINITIS; VOCAL CORD DISORDER; OBSTRUCTIVE CHRONIC BRONCHITIS; EXTRINSIC ASTHMA, UNSPECIFIED; GERD; DIABETES MELLITUS, BORDERLINE; and HYPOGAMMAGLOBULINEMIA on her problem list.     is allergic to meperidine hcl and montelukast sodium.  Ms. Vierra does not currently have medications on file.  Past Surgical History  Procedure Date  . Vesicovaginal fistula closure w/ tah   . Nasal sinus surgery   . Neck surgery     x 2   . Breast surgery   . Portacath placement 7/11    Removed 6/12    Denies any headaches, dizziness, double vision, fevers, chills, night sweats, nausea, vomiting, diarrhea, constipation, chest pain, heart palpitations, shortness of breath, blood in stool, black tarry stool, urinary pain, urinary burning, urinary frequency, hematuria.   PHYSICAL EXAMINATION  ECOG PERFORMANCE STATUS: 1 - Symptomatic but completely ambulatory  Filed  Vitals:   12/23/10 1132  BP: 103/68  Pulse: 61  Temp: 98.3 F (36.8 C)    GENERAL:alert, well nourished, well developed, comfortable, cooperative and smiling SKIN: skin color, texture, turgor are normal HEAD: Normocephalic EYES: normal EARS: External ears normal OROPHARYNX:mucous membranes are moist  NECK: supple, trachea midline LYMPH:  not examined BREAST:right breast noted to have changing ecchymosis.  0.75 cm nodule appreciated in the 12 o'clock position superior to nipple/areola complex.  It is softer and not as prominent.  On previous visit, ecchymosis was noted superficial to the nodule.  Now it is noted laterally and medially with clearing superficial to the nodule.  Nodule is mobile.  It is less hard to palpation.  HEART: regular rate & rhythm, no murmurs and no gallops NEURO: alert & oriented x 3 with fluent speech, no focal motor/sensory deficits, gait normal   RADIOGRAPHIC STUDIES:  07/19/2010  DG SCREEN MAMMOGRAM BILATERAL  Bilateral CC and MLO view(s) were taken.  Technologist: Faith Little  DIGITAL SCREENING MAMMOGRAM WITH CAD:  There are scattered fibroglandular densities. No masses or malignant type calcifications are  identified. Compared with prior studies.  Images were processed with CAD.  IMPRESSION:  No specific mammographic evidence of malignancy. Next screening mammogram is recommended in one  year.  A result letter of this screening mammogram will be mailed directly to the patient.  ASSESSMENT: Negative - BI-RADS 1  Screening mammogram in 1 year.    ASSESSMENT:  1. Right sided breast ecchymosis with clearing superficial to nodule.  Ecchymosis noted laterally and medially to  nodule. Resolving.  2. Right breast nodule 0.75 cm in size in the 12 o'clock position superior to the nipple/areola complex.  Less prominent on today's exam.  Soft and mobile.   PLAN:  1. Return as scheduled in Feb 2013.   2. Continue with anticipated Rituxan chemotherapy in  March 2013.   All questions were answered. The patient knows to call the clinic with any problems, questions or concerns. We can certainly see the patient much sooner if necessary.   KEFALAS,THOMAS

## 2011-01-11 ENCOUNTER — Other Ambulatory Visit: Payer: Self-pay | Admitting: Critical Care Medicine

## 2011-01-11 MED ORDER — ZAFIRLUKAST 20 MG PO TABS: 20.0000 mg | ORAL_TABLET | Freq: Two times a day (BID) | ORAL | Status: DC

## 2011-01-11 NOTE — Telephone Encounter (Signed)
RX sent

## 2011-01-12 ENCOUNTER — Encounter: Payer: Self-pay | Admitting: Critical Care Medicine

## 2011-01-12 ENCOUNTER — Ambulatory Visit (INDEPENDENT_AMBULATORY_CARE_PROVIDER_SITE_OTHER): Payer: BC Managed Care – PPO | Admitting: Critical Care Medicine

## 2011-01-12 VITALS — BP 104/72 | HR 72 | Temp 98.9°F | Ht 62.0 in | Wt 171.4 lb

## 2011-01-12 DIAGNOSIS — J329 Chronic sinusitis, unspecified: Secondary | ICD-10-CM

## 2011-01-12 MED ORDER — DEXTROSE 5 % IV SOLN: 1.0000 g | Freq: Two times a day (BID) | INTRAVENOUS | Status: DC

## 2011-01-12 NOTE — Progress Notes (Signed)
Subjective:    Patient ID: [name], female    DOB: 1955-10-01, 56 y.o.   MRN: [id number]  HPI  56 y.o.WF  08/11/10 In hosp twice 6/12 for SVC clot and light headedness started.  Cough spell and went black.  Notes more dyspnea and cough .   Notes no real pndrip.  Notes some wheeze.  Last chemo was 3/12. Rx every 6months.  Saw ENT:  Sinuses seemed clear.  Asthma History:   Symptoms  Daily         Nighttime Awakenings  3-4/month         Asthma interference with normal activity  Minor limitations         SABA use (not for EIB)  > 2 days/wk--not > 1 x/day         Risk: Exacerbations requiring oral systemic steroids  0-1 / year          [facility] 05/26/10: 1. 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 Status post tissue plasminogen  activator with resolution of symptoms.  2. Subtherapeutic Coumadin level.  3. History of deep vein thrombosis.  4. History of high-grade B-cell lymphoma, status post chemotherapy.  5. History of sinusitis.  6. Has recent PET scan which did show hypermetabolic activity at the  posterior nasopharynx, is scheduled to see Dr.   06/07/10 Hosp 1. Superior vena cava thrombosis.  2. Sinusitis.  3. Bronchitis.  4. Asthma.  5. History of deep vein thrombosis.  6. Cervical myelopathy.  7. History of high-grade B-cell lymphoma status post chemotherapy.  8. History of abnormal hypermetabolic activity at the posterior  nasopharynx per PET scan.  06/07/10 [id number]:[id number] IMPRESSION:  No acute abnormalities demonstrated in the chest. No evidence of  significant lymphadenopathy. Again demonstrated are atresia of the  left jugular vein and focal clot or fibrin sheath around the distal  central venous catheter causing near occlusion of the superior vena  cava with associated collaterals.   6/4/ CT sinus IMPRESSION:  1. Extensive paranasal sinus disease.  2.  Small calculus noted than the right submandibular gland.  3. No acute bony findings and no adenopathy.  4. Fullness in the left piriform sinus. This is incompletely  evaluated. Direct visualization is suggested.  9/12 After last ov got better on cipro/pred then  got better but worse  And on cxr  Worse and rx avelox per onc. Just finished Just started Chemo again.  ? pna vs lymphoma issue 8/31 cxr: IMPRESSION:  Emphysematous and bronchitic changes with bibasilar atelectasis and  question minimal lingular infiltrate   Since off avelox no more fever and sweats . Cough is better but still present.  Gets lightheaded if cough spells  11/12 Notings severe coughing paroxysms.  Noting since 9/12 OV.  Cough is dry.  In AM has yellow mucus. Notes some wheeze with cough.  NHL:  Last ChemoRx 9/12. Recent PET scan neg.  Running lowgrade fever.  1/9 Not any better after 14days of cipro and 10days of levaquin.    Symptoms now: drainage, dry cough, pndrip, fever to 102.   No headaches in the face. NHL is in chemorx. Next round is in March 2013 No portacath is in place.  Past Medical History  Diagnosis Date  . Non Hodgkin's lymphoma   . Allergic rhinitis   . GERD (gastroesophageal reflux disease)   . Asthma   . Chronic  sinusitis   . Respiratory failure   . Cavitary lung disease      Family History  Problem Relation Age of Onset  . Emphysema Mother   . Allergies Father   . Asthma Father     as a child  . Leukemia Maternal Grandmother   . Diabetes Brother   . Stroke Mother      History   Social History  . Marital Status: Divorced    Spouse Name: N/A    Number of Children: 3  . Years of Education: N/A   Occupational History  . [employer] Service Manager    Social History Main Topics  . Smoking status: Never Smoker   . Smokeless tobacco: Never Used  . Alcohol Use: No  . Drug Use: Not on file  . Sexually Active: Not on file   Other Topics Concern  . Not on file   Social  History Narrative  . No narrative on file     Allergies  Allergen Reactions  . Meperidine Hcl   . Montelukast Sodium      Outpatient Prescriptions Prior to Visit  Medication Sig Dispense Refill  . acyclovir (ZOVIRAX) 200 MG capsule 2 capsules twice per day       . albuterol (PROVENTIL) (2.5 MG/3ML) 0.083% nebulizer solution Take 2.5 mg by nebulization every 4 (four) hours as needed.  120 mL  6  . albuterol (VENTOLIN HFA) 108 (90 BASE) MCG/ACT inhaler Inhale 2 puffs into the lungs every 6 (six) hours as needed.        . citalopram (CELEXA) 20 MG tablet Take 20 mg by mouth every morning.        [name] Kitchen EPINEPHrine (EPIPEN IJ) As needed for severe allergic reaction       . furosemide (LASIX) 20 MG tablet Take 1 tablet by mouth as needed.      . Mometasone Furo-Formoterol Fum 200-5 MCG/ACT AERO Inhale 2 Inhalers into the lungs 2 (two) times daily.  1 Inhaler  6  . omeprazole (PRILOSEC) 20 MG capsule Take 1 capsule (20 mg total) by mouth 2 (two) times daily.  60 capsule  6  . potassium chloride 40 MEQ/15ML (20%) LIQD 2 tablespoons twice daily       . predniSONE (DELTASONE) 10 MG tablet Take 5 mg by mouth daily.        . simvastatin (ZOCOR) 80 MG tablet Take 80 mg by mouth at bedtime.        [name] Kitchen warfarin (COUMADIN) 10 MG tablet Take by mouth daily. As directed.  Currently taking 6mg  doses.      . zafirlukast (ACCOLATE) 20 MG tablet Take 1 tablet (20 mg total) by mouth 2 (two) times daily.  60 tablet  5     Review of Systems  Constitutional:   No  weight loss, night sweats,  Fevers, chills, fatigue, lassitude. HEENT:   No headaches,  Difficulty swallowing,  Tooth/dental problems,  Sore throat,                No sneezing, itching, ear ache, nasal congestion, post nasal drip,   CV:  No chest pain,  Orthopnea, PND, swelling in lower extremities, anasarca, dizziness, palpitations  GI  No heartburn, indigestion, abdominal pain, nausea, vomiting, diarrhea, change in bowel habits, loss of  appetite  Resp: No shortness of breath with exertion or at rest.  No excess mucus, no productive cough,  No non-productive cough,  No coughing up of blood.  No change in color  of mucus.  No wheezing.  No chest wall deformity  Skin: no rash or lesions.  GU: no dysuria, change in color of urine, no urgency or frequency.  No flank pain.  MS:  No joint pain or swelling.  No decreased range of motion.  No back pain.  Psych:  No change in mood or affect. No depression or anxiety.  No memory loss.     Objective:   Physical Exam  Filed Vitals:   01/12/11 1139  BP: 104/72  Pulse: 72  Temp: 98.9 F (37.2 C)  TempSrc: Oral  Height: 5\' 2"  (1.575 m)  Weight: 171 lb 6.4 oz (77.747 kg)  SpO2: 92%    Gen: Pleasant, well-nourished, in no distress,  normal affect  ENT: No lesions,  mouth clear,  oropharynx clear, no postnasal drip, severe purulence in Left and  right nares  Neck: No JVD, no TMG, no carotid bruits  Lungs: No use of accessory muscles, no dullness to percussion, exp wheeze  Cardiovascular: RRR, heart sounds normal, no murmur or gallops, no peripheral edema  Abdomen: soft and NT, no HSM,  BS normal  Musculoskeletal: No deformities, no cyanosis or clubbing  Neuro: alert, non focal  Skin: Warm, no lesions or rashes  09/03/2010 chest x-ray Mild left lingular infiltrate    Assessment & Plan:   SINUSITIS, CHRONIC NOS Chronic recurrent sinusitis d/t pseudomonas, failed two courses of IV ABX  Pt is immunosuppressed and has severe persistent asthma Plan Cefepime 1gm IV q12h x 7days with home IV infusion No change in inhaled or maintenance medications. Return in  Short term      Updated Medication List Outpatient Encounter Prescriptions as of 01/12/2011  Medication Sig Dispense Refill  . acyclovir (ZOVIRAX) 200 MG capsule 2 capsules twice per day       . albuterol (PROVENTIL) (2.5 MG/3ML) 0.083% nebulizer solution Take 2.5 mg by nebulization every 4 (four) hours as  needed.  120 mL  6  . albuterol (VENTOLIN HFA) 108 (90 BASE) MCG/ACT inhaler Inhale 2 puffs into the lungs every 6 (six) hours as needed.        . citalopram (CELEXA) 20 MG tablet Take 20 mg by mouth every morning.        [name] Kitchen EPINEPHrine (EPIPEN IJ) As needed for severe allergic reaction       . furosemide (LASIX) 20 MG tablet Take 1 tablet by mouth as needed.      . Mometasone Furo-Formoterol Fum 200-5 MCG/ACT AERO Inhale 2 Inhalers into the lungs 2 (two) times daily.  1 Inhaler  6  . omeprazole (PRILOSEC) 20 MG capsule Take 1 capsule (20 mg total) by mouth 2 (two) times daily.  60 capsule  6  . potassium chloride 40 MEQ/15ML (20%) LIQD 2 tablespoons twice daily       . predniSONE (DELTASONE) 10 MG tablet Take 5 mg by mouth daily.        . simvastatin (ZOCOR) 80 MG tablet Take 80 mg by mouth at bedtime.        [name] Kitchen warfarin (COUMADIN) 10 MG tablet Take by mouth daily. As directed.  Currently taking 6mg  doses.      . zafirlukast (ACCOLATE) 20 MG tablet Take 1 tablet (20 mg total) by mouth 2 (two) times daily.  60 tablet  5  . dextrose 5 % SOLN 50 mL with ceFEPIme 1 G SOLR 1 g Inject 1 g into the vein every 12 (twelve) hours.  14 g  0

## 2011-01-12 NOTE — Patient Instructions (Signed)
Start Cefepime 1gm iv twice daily with [facility] for 7days No other medication changes Return 3 weeks

## 2011-01-13 NOTE — Assessment & Plan Note (Signed)
Chronic recurrent sinusitis d/t pseudomonas, failed two courses of IV ABX  Pt is immunosuppressed and has severe persistent asthma Plan Cefepime 1gm IV q12h x 7days with home IV infusion No change in inhaled or maintenance medications. Return in  Short term

## 2011-01-14 ENCOUNTER — Other Ambulatory Visit (HOSPITAL_COMMUNITY): Payer: Self-pay

## 2011-01-14 ENCOUNTER — Telehealth (HOSPITAL_COMMUNITY): Payer: Self-pay

## 2011-01-14 DIAGNOSIS — C8589 Other specified types of non-Hodgkin lymphoma, extranodal and solid organ sites: Secondary | ICD-10-CM

## 2011-01-14 DIAGNOSIS — D801 Nonfamilial hypogammaglobulinemia: Secondary | ICD-10-CM

## 2011-01-14 NOTE — Telephone Encounter (Signed)
Message copied by Sterling Big on Fri Jan 14, 2011  6:13 PM ------      Message from: Mariel Sleet, ERIC S      Created: Fri Jan 14, 2011 10:13 AM       Fannie Knee, she may need IVIG      Call her and ask her when her last 2 antibiotics were administered either IV or PO

## 2011-01-14 NOTE — Telephone Encounter (Signed)
Dr. Delford Field prescribed a course of Cipro in December, Dr. Kelli Churn gave her a course of Levaquin which she finished early January and Dr. Delford Field prescribed a course of intravenous antibiotics being given by home health twice daily for 7 days (started yesterday).

## 2011-01-17 ENCOUNTER — Encounter (HOSPITAL_COMMUNITY): Payer: BC Managed Care – PPO | Attending: Oncology

## 2011-01-17 DIAGNOSIS — C8589 Other specified types of non-Hodgkin lymphoma, extranodal and solid organ sites: Secondary | ICD-10-CM | POA: Insufficient documentation

## 2011-01-17 DIAGNOSIS — D801 Nonfamilial hypogammaglobulinemia: Secondary | ICD-10-CM | POA: Insufficient documentation

## 2011-01-17 NOTE — Progress Notes (Signed)
Labs drawn today for [facility]

## 2011-01-18 ENCOUNTER — Other Ambulatory Visit (HOSPITAL_COMMUNITY): Payer: Self-pay | Admitting: Oncology

## 2011-01-18 DIAGNOSIS — J329 Chronic sinusitis, unspecified: Secondary | ICD-10-CM

## 2011-01-18 DIAGNOSIS — D801 Nonfamilial hypogammaglobulinemia: Secondary | ICD-10-CM

## 2011-01-18 LAB — IGG, IGA, IGM: IgG (Immunoglobin G), Serum: 511 mg/dL — ABNORMAL LOW (ref 690–1700)

## 2011-01-18 MED ORDER — IMMUNE GLOBULIN (HUMAN) 20 GM/200ML IV SOLN: 1.0000 g/kg | INTRAVENOUS | Status: DC

## 2011-01-21 ENCOUNTER — Ambulatory Visit (HOSPITAL_COMMUNITY): Payer: BC Managed Care – PPO

## 2011-01-21 ENCOUNTER — Other Ambulatory Visit (HOSPITAL_COMMUNITY): Payer: Self-pay | Admitting: Oncology

## 2011-01-21 ENCOUNTER — Encounter (HOSPITAL_BASED_OUTPATIENT_CLINIC_OR_DEPARTMENT_OTHER): Payer: BC Managed Care – PPO

## 2011-01-21 VITALS — BP 123/78 | HR 90 | Temp 99.8°F

## 2011-01-21 DIAGNOSIS — D801 Nonfamilial hypogammaglobulinemia: Secondary | ICD-10-CM

## 2011-01-21 DIAGNOSIS — C8589 Other specified types of non-Hodgkin lymphoma, extranodal and solid organ sites: Secondary | ICD-10-CM

## 2011-01-21 MED ORDER — HEPARIN SOD (PORK) LOCK FLUSH 100 UNIT/ML IV SOLN
500.0000 [IU] | Freq: Once | INTRAVENOUS | Status: DC
  Filled 2011-01-21: qty 5

## 2011-01-21 MED ORDER — IMMUNE GLOBULIN (HUMAN) 20 GM/200ML IV SOLN
80.0000 g | INTRAVENOUS | Status: DC
  Administered 2011-01-21: 80 g via INTRAVENOUS
  Filled 2011-01-21 (×2): qty 800

## 2011-01-21 MED ORDER — SODIUM CHLORIDE 0.9 % IJ SOLN
10.0000 mL | INTRAMUSCULAR | Status: DC | PRN
  Administered 2011-01-21: 10 mL via INTRAVENOUS
  Filled 2011-01-21: qty 10

## 2011-01-21 MED ORDER — IMMUNE GLOBULIN (HUMAN) 20 GM/200ML IV SOLN
1.0000 g/kg | INTRAVENOUS | Status: DC
  Filled 2011-01-21: qty 800

## 2011-01-21 MED ORDER — SODIUM CHLORIDE 0.9 % IV SOLN
INTRAVENOUS | Status: DC
  Administered 2011-01-21: 11:00:00 via INTRAVENOUS

## 2011-01-21 MED ORDER — SODIUM CHLORIDE 0.9 % IV SOLN: INTRAVENOUS | Status: DC

## 2011-01-21 NOTE — Progress Notes (Signed)
1125 IVIG 80 gms begun at 38 ml/hr 1140 rate increased to 77 ml/hr 1155 rate increased to 154 ml/hr, 1210 final rate of 308 ml/hr til complete

## 2011-01-24 ENCOUNTER — Encounter (HOSPITAL_BASED_OUTPATIENT_CLINIC_OR_DEPARTMENT_OTHER): Payer: BC Managed Care – PPO

## 2011-01-24 ENCOUNTER — Other Ambulatory Visit (HOSPITAL_COMMUNITY): Payer: Self-pay | Admitting: Oncology

## 2011-01-24 VITALS — BP 138/82 | HR 62 | Temp 98.8°F

## 2011-01-24 DIAGNOSIS — C8589 Other specified types of non-Hodgkin lymphoma, extranodal and solid organ sites: Secondary | ICD-10-CM

## 2011-01-24 DIAGNOSIS — D801 Nonfamilial hypogammaglobulinemia: Secondary | ICD-10-CM

## 2011-01-24 MED ORDER — IMMUNE GLOBULIN (HUMAN) 20 GM/200ML IV SOLN: 1.0000 g/kg | Freq: Once | INTRAVENOUS | Status: DC

## 2011-01-24 MED ORDER — IMMUNE GLOBULIN (HUMAN) 20 GM/200ML IV SOLN
80.0000 g | Freq: Once | INTRAVENOUS | Status: AC
  Administered 2011-01-24: 80 g via INTRAVENOUS
  Filled 2011-01-24: qty 800

## 2011-01-24 NOTE — Progress Notes (Signed)
1030 IVIG 80 gms  Started at 33.5 ml/hr for 15 min 1045 Rate increased to 77 ml/hr 1100 Rate increased to to 150 ml/hr 1115 Tolerating well.  Rate increased to 308 ml/hr, 1400 infusion complete. Toleraterd well. Discharged home.

## 2011-02-07 ENCOUNTER — Encounter (HOSPITAL_COMMUNITY): Payer: BC Managed Care – PPO | Attending: Oncology | Admitting: Oncology

## 2011-02-07 VITALS — BP 117/80 | HR 79 | Temp 97.8°F | Ht 62.0 in | Wt 173.6 lb

## 2011-02-07 DIAGNOSIS — D801 Nonfamilial hypogammaglobulinemia: Secondary | ICD-10-CM

## 2011-02-07 DIAGNOSIS — C8589 Other specified types of non-Hodgkin lymphoma, extranodal and solid organ sites: Secondary | ICD-10-CM

## 2011-02-07 NOTE — Progress Notes (Signed)
[name] Ruths, MD, MD [address] [place] [address]  1. NON-HODGKIN'S LYMPHOMA  CBC, Differential, Comprehensive metabolic panel, Lactate dehydrogenase, Sedimentation rate    CURRENT THERAPY:S/P R-CHOP x 4 cycles with intrathecal chemotherapy during cycles 3 and 4 with methotrexate and Solu-Cortef. Treatment was stopped due to Pseudomonas sepsis after cycles 2 and 4 which was very severe.   INTERVAL HISTORY: [name] 56 y.o. female returns for  regular  visit for followup of  Diffuse large B-Cell Lymphoma.  The patient reports hot flashes.  This has been an ongoing problem with the patient.    She otherwise is doing well.  Her appetite is strong.  She continue to work as a Engineer, materials at [employer].  She denies any complaints today.  We went over the idea of calling us if she gets an infection that is not rectified with antibiotic usage for consideration of IVIG.  She denies any signs or symptoms of a sinus infection presently.  She reports that she feels well.  In the past she had a right breast nodule and ecchymosis.  She reports today that it has completely resolved.   ROS: No TIA's or unusual headaches, no dysphagia.  No prolonged cough. No dyspnea or chest pain on exertion.  No abdominal pain, change in bowel habits, black or bloody stools.  No urinary tract symptoms.  No new or unusual musculoskeletal symptoms. No abnormal vaginal bleeding, discharge or unexpected pelvic pain. No new breast lumps, breast pain or nipple discharge.   Past Medical History  Diagnosis Date  . Non Hodgkin's lymphoma   . Allergic rhinitis   . GERD (gastroesophageal reflux disease)   . Asthma   . Chronic sinusitis   . Respiratory failure   . Cavitary lung disease     has NON-HODGKIN'S LYMPHOMA; GASTRIC POLYP; HYPERLIPIDEMIA; HYPERTENSION; SINUSITIS, CHRONIC NOS; ALLERGIC RHINITIS; VOCAL CORD DISORDER; OBSTRUCTIVE CHRONIC BRONCHITIS; EXTRINSIC ASTHMA, UNSPECIFIED;  GERD; DIABETES MELLITUS, BORDERLINE; and HYPOGAMMAGLOBULINEMIA on her problem list.     is allergic to meperidine hcl and montelukast sodium.  [name] does not currently have medications on file.  Past Surgical History  Procedure Date  . Vesicovaginal fistula closure w/ tah   . Nasal sinus surgery   . Neck surgery     x 2   . Breast surgery   . Portacath placement 7/11    Removed 6/12    Denies any headaches, dizziness, double vision, chills, nausea, vomiting, diarrhea, constipation, chest pain, heart palpitations, shortness of breath, blood in stool, black tarry stool, urinary pain, urinary burning, urinary frequency, hematuria.   PHYSICAL EXAMINATION  ECOG PERFORMANCE STATUS: 1 - Symptomatic but completely ambulatory  Filed Vitals:   02/07/11 1455  BP: 117/80  Pulse: 79  Temp: 97.8 F (36.6 C)    GENERAL:alert, no distress, well nourished, well developed, comfortable, cooperative, obese and smiling SKIN: skin color, texture, turgor are normal, no rashes or significant lesions.  She shows me a 1 cm round lesion on her right side of abdomen that is slightly hyperpigmented and flat.  She also shows me a scab on her scalp just to the right of her midline part. HEAD: Normocephalic, No masses, lesions, tenderness or abnormalities EYES: normal EARS: External ears normal OROPHARYNX:mucous membranes are moist  NECK: supple, no adenopathy, no JVD, thyroid normal size, non-tender, without nodularity, no stridor, non-tender, trachea midline LYMPH:  no palpable lymphadenopathy, no hepatosplenomegaly BREAST:not examined LUNGS: clear to auscultation and percussion HEART:  regular rate & rhythm, no murmurs, no gallops, S1 normal and S2 normal ABDOMEN:abdomen soft, non-tender, obese, normal bowel sounds, no masses or organomegaly and no hepatosplenomegaly BACK: Back symmetric, no curvature., No CVA tenderness EXTREMITIES:less then 2 second capillary refill, no joint deformities,  effusion, or inflammation, no edema, no skin discoloration, no clubbing, no cyanosis  NEURO: alert & oriented x 3 with fluent speech, no focal motor/sensory deficits, gait normal   LABORATORY DATA: CBC    Component Value Date/Time   WBC 6.8 11/05/2010 1540   RBC 4.00 11/05/2010 1540   HGB 12.3 11/05/2010 1540   HCT 37.7 11/05/2010 1540   PLT 212 11/05/2010 1540   MCV 94.3 11/05/2010 1540   MCH 30.8 11/05/2010 1540   MCHC 32.6 11/05/2010 1540   RDW 14.6 11/05/2010 1540   LYMPHSABS 1.0 11/05/2010 1540   MONOABS 0.4 11/05/2010 1540   EOSABS 0.1 11/05/2010 1540   BASOSABS 0.0 11/05/2010 1540      Chemistry      Component Value Date/Time   NA 138 11/05/2010 1540   K 4.0 11/05/2010 1540   CL 100 11/05/2010 1540   CO2 31 11/05/2010 1540   BUN 12 11/05/2010 1540   CREATININE 1.05 11/05/2010 1540      Component Value Date/Time   CALCIUM 9.6 11/05/2010 1540   ALKPHOS 107 11/05/2010 1540   AST 13 11/05/2010 1540   ALT 16 11/05/2010 1540   BILITOT 0.3 11/05/2010 1540      PATHOLOGY:    ASSESSMENT:  1. Diffuse large b-cell non-hodgkin's lymphoma, no sign of recurrence on PET scan 11/04/10  2. Fevers, but feeling well, maybe improved.  3. Hypogammaglobulinemia    PLAN:  1. Pre-chemo lab work on 03/11/11: CBC diff, CMET, LDH, ESR 2. Patient informed to call the clinic with any infections not rectified with antibioticsfor consideration of IVIG. 3. Return for Rituxan antibody therapy on 03/11/11 4. Return in 3 months for follow-up.   All questions were answered. The patient knows to call the clinic with any problems, questions or concerns. We can certainly see the patient much sooner if necessary.  The patient and plan discussed with Glenford Peers, MD and he is in agreement with the aforementioned.  I spent 20 minutes counseling the patient face to face. The total time spent in the appointment was 25 minutes.  KEFALAS,THOMAS

## 2011-02-07 NOTE — Patient Instructions (Signed)
[facility]  Discharge Instructions  RECOMMENDATIONS MADE BY THE CONSULTANT AND ANY TEST RESULTS WILL BE SENT TO YOUR REFERRING DOCTOR.   EXAM FINDINGS BY MD TODAY AND SIGNS AND SYMPTOMS TO REPORT TO CLINIC OR PRIMARY MD: Exam good today.  INSTRUCTIONS GIVEN AND DISCUSSED: Call us with fever or productive cough  SPECIAL INSTRUCTIONS/FOLLOW-UP: 3 months to see Dr. Mariel Sleet   I acknowledge that I have been informed and understand all the instructions given to me and received a copy. I do not have any more questions at this time, but understand that I may call the Specialty Clinic at [facility] at [phone] during business hours should I have any further questions or need assistance in obtaining follow-up care.    __________________________________________  _____________  __________ Signature of Patient or Authorized Representative            Date                   Time    __________________________________________ Nurse's Signature

## 2011-02-09 ENCOUNTER — Ambulatory Visit: Payer: BC Managed Care – PPO | Admitting: Critical Care Medicine

## 2011-02-09 ENCOUNTER — Other Ambulatory Visit: Payer: Self-pay | Admitting: Adult Health

## 2011-02-09 DIAGNOSIS — IMO0002 Reserved for concepts with insufficient information to code with codable children: Secondary | ICD-10-CM

## 2011-02-14 ENCOUNTER — Ambulatory Visit (INDEPENDENT_AMBULATORY_CARE_PROVIDER_SITE_OTHER): Payer: BC Managed Care – PPO | Admitting: Critical Care Medicine

## 2011-02-14 ENCOUNTER — Encounter: Payer: Self-pay | Admitting: Critical Care Medicine

## 2011-02-14 DIAGNOSIS — J329 Chronic sinusitis, unspecified: Secondary | ICD-10-CM

## 2011-02-14 MED ORDER — SULFAMETHOXAZOLE-TMP DS 800-160 MG PO TABS: 1.0000 | ORAL_TABLET | Freq: Two times a day (BID) | ORAL | Status: AC

## 2011-02-14 NOTE — Patient Instructions (Addendum)
Bactrim DS one twice daily for 10days No other medication changes Have your protime checked in the next 7days , the Bactrim will make your blood thinner Return one month

## 2011-02-14 NOTE — Assessment & Plan Note (Signed)
Chronic recurrent sinusitis d/t pseudomonas Plan Trial bactrim DS one po bid x 7days Cont other inhaled meds Cont IV IgG program per heme

## 2011-02-14 NOTE — Progress Notes (Signed)
Subjective:    Patient ID: [name], female    DOB: [birth year], 56 y.o.   MRN: [id number]  HPI  56 y.o.WF  Asthma , hypogammaglobulinemia, chronic sinusits d/t pseudomonas    02/14/2011 Was given IgG IV per heme/onc.  Now feels better.  No real cough except in early am. Pt sneezing and yellow mucus over the past week.    Now off ABX [facility] further ABX  Past Medical History  Diagnosis Date  . Non Hodgkin's lymphoma   . Allergic rhinitis   . GERD (gastroesophageal reflux disease)   . Asthma   . Chronic sinusitis   . Respiratory failure   . Cavitary lung disease      Family History  Problem Relation Age of Onset  . Emphysema Mother   . Allergies Father   . Asthma Father     as a child  . Leukemia Maternal Grandmother   . Diabetes Brother   . Stroke Mother      History   Social History  . Marital Status: Divorced    Spouse Name: N/A    Number of Children: 3  . Years of Education: N/A   Occupational History  . [employer] Service Manager    Social History Main Topics  . Smoking status: Never Smoker   . Smokeless tobacco: Never Used  . Alcohol Use: No  . Drug Use: Not on file  . Sexually Active: Not on file   Other Topics Concern  . Not on file   Social History Narrative  . No narrative on file     Allergies  Allergen Reactions  . Meperidine Hcl   . Montelukast Sodium      Outpatient Prescriptions Prior to Visit  Medication Sig Dispense Refill  . acyclovir (ZOVIRAX) 200 MG capsule 2 capsules twice per day       . albuterol (PROVENTIL) (2.5 MG/3ML) 0.083% nebulizer solution Take 2.5 mg by nebulization every 4 (four) hours as needed.  120 mL  6  . albuterol (VENTOLIN HFA) 108 (90 BASE) MCG/ACT inhaler Inhale 2 puffs into the lungs every 6 (six) hours as needed.        . citalopram (CELEXA) 20 MG tablet Take 20 mg by mouth every morning.        [name] Kitchen EPINEPHrine (EPIPEN IJ) As needed for severe allergic reaction       . furosemide (LASIX) 20 MG  tablet Take 1 tablet by mouth as needed.      . Mometasone Furo-Formoterol Fum 200-5 MCG/ACT AERO Inhale 2 Inhalers into the lungs 2 (two) times daily.  1 Inhaler  6  . omeprazole (PRILOSEC) 20 MG capsule Take 1 capsule (20 mg total) by mouth 2 (two) times daily.  60 capsule  6  . potassium chloride 40 MEQ/15ML (20%) LIQD 2 tablespoons twice daily       . predniSONE (DELTASONE) 10 MG tablet Take 5 mg by mouth daily.        . simvastatin (ZOCOR) 80 MG tablet Take 80 mg by mouth at bedtime.        [name] Kitchen warfarin (COUMADIN) 10 MG tablet Take by mouth daily. As directed.  Currently taking 6mg  doses.      . zafirlukast (ACCOLATE) 20 MG tablet Take 1 tablet (20 mg total) by mouth 2 (two) times daily.  60 tablet  5  . dextrose 5 % SOLN 50 mL with ceFEPIme 1 G SOLR 1 g Inject 1 g into the vein every 12 (  twelve) hours.  14 g  0     Review of Systems  Constitutional:   No  weight loss, night sweats,  Fevers, chills, fatigue, lassitude. HEENT:   No headaches,  Difficulty swallowing,  Tooth/dental problems,  Sore throat,                No sneezing, itching, ear ache, nasal congestion, post nasal drip,   CV:  No chest pain,  Orthopnea, PND, swelling in lower extremities, anasarca, dizziness, palpitations  GI  No heartburn, indigestion, abdominal pain, nausea, vomiting, diarrhea, change in bowel habits, loss of appetite  Resp: No shortness of breath with exertion or at rest.  No excess mucus, notes  productive cough,  No non-productive cough,  No coughing up of blood.  Notes  change in color of mucus.  No wheezing.  No chest wall deformity  Skin: no rash or lesions.  GU: no dysuria, change in color of urine, no urgency or frequency.  No flank pain.  MS:  No joint pain or swelling.  No decreased range of motion.  No back pain.  Psych:  No change in mood or affect. No depression or anxiety.  No memory loss.     Objective:   Physical Exam  Filed Vitals:   02/14/11 1555  BP: 118/72  Pulse: 78    Temp: 98 F (36.7 C)  TempSrc: Oral  Height: 5\' 2"  (1.575 m)  Weight: 175 lb (79.379 kg)  SpO2: 94%    Gen: Pleasant, well-nourished, in no distress,  normal affect  ENT: No lesions,  mouth clear,  oropharynx clear, no postnasal drip, moderate  purulence in Left   nare  Neck: No JVD, no TMG, no carotid bruits  Lungs: No use of accessory muscles, no dullness to percussion, exp wheeze  Cardiovascular: RRR, heart sounds normal, no murmur or gallops, no peripheral edema  Abdomen: soft and NT, no HSM,  BS normal  Musculoskeletal: No deformities, no cyanosis or clubbing  Neuro: alert, non focal  Skin: Warm, no lesions or rashes  09/03/2010 chest x-ray Mild left lingular infiltrate    Assessment & Plan:   SINUSITIS, CHRONIC NOS Chronic recurrent sinusitis d/t pseudomonas Plan Trial bactrim DS one po bid x 7days Cont other inhaled meds Cont IV IgG program per heme       Updated Medication List Outpatient Encounter Prescriptions as of 02/14/2011  Medication Sig Dispense Refill  . acyclovir (ZOVIRAX) 200 MG capsule 2 capsules twice per day       . albuterol (PROVENTIL) (2.5 MG/3ML) 0.083% nebulizer solution Take 2.5 mg by nebulization every 4 (four) hours as needed.  120 mL  6  . albuterol (VENTOLIN HFA) 108 (90 BASE) MCG/ACT inhaler Inhale 2 puffs into the lungs every 6 (six) hours as needed.        . citalopram (CELEXA) 20 MG tablet Take 20 mg by mouth every morning.        [name] Kitchen EPINEPHrine (EPIPEN IJ) As needed for severe allergic reaction       . furosemide (LASIX) 20 MG tablet Take 1 tablet by mouth as needed.      . Mometasone Furo-Formoterol Fum 200-5 MCG/ACT AERO Inhale 2 Inhalers into the lungs 2 (two) times daily.  1 Inhaler  6  . omeprazole (PRILOSEC) 20 MG capsule Take 1 capsule (20 mg total) by mouth 2 (two) times daily.  60 capsule  6  . potassium chloride 40 MEQ/15ML (20%) LIQD 2 tablespoons twice daily       .  predniSONE (DELTASONE) 10 MG tablet Take 5 mg by  mouth daily.        . simvastatin (ZOCOR) 80 MG tablet Take 80 mg by mouth at bedtime.        [name] Kitchen warfarin (COUMADIN) 10 MG tablet Take by mouth daily. As directed.  Currently taking 6mg  doses.      . zafirlukast (ACCOLATE) 20 MG tablet Take 1 tablet (20 mg total) by mouth 2 (two) times daily.  60 tablet  5  . sulfamethoxazole-trimethoprim (BACTRIM DS) 800-160 MG per tablet Take 1 tablet by mouth 2 (two) times daily.  20 tablet  0  . DISCONTD: dextrose 5 % SOLN 50 mL with ceFEPIme 1 G SOLR 1 g Inject 1 g into the vein every 12 (twelve) hours.  14 g  0

## 2011-02-16 ENCOUNTER — Telehealth (HOSPITAL_COMMUNITY): Payer: Self-pay

## 2011-02-16 ENCOUNTER — Telehealth (HOSPITAL_COMMUNITY): Payer: Self-pay | Admitting: *Deleted

## 2011-02-16 NOTE — Telephone Encounter (Signed)
Saw Dr.Wright on Monday 02/13/10 and he put her on Bactrim 2 x day x 10 days for sinus infection. She said she could tell that she was getting better already.

## 2011-02-16 NOTE — Telephone Encounter (Addendum)
12:26 pm Message left for patient to call with update on condition./SB   Message copied by Sterling Big on Wed Feb 16, 2011 12:25 PM ------      Message from: Mariel Sleet, ERIC S      Created: Wed Feb 16, 2011 12:23 PM       Call her and see how she is doing with her sinuses.

## 2011-02-16 NOTE — Telephone Encounter (Signed)
Message copied by Dennie Maizes on Wed Feb 16, 2011 12:34 PM ------      Message from: Mariel Sleet, ERIC S      Created: Wed Feb 16, 2011 12:23 PM       Call her and see how she is doing with her sinuses.

## 2011-02-23 ENCOUNTER — Ambulatory Visit (HOSPITAL_COMMUNITY)
Admission: RE | Admit: 2011-02-23 | Discharge: 2011-02-23 | Disposition: A | Discharge status: Home / Self Care | Payer: BC Managed Care – PPO | Source: Ambulatory Visit | Attending: Adult Health | Admitting: Adult Health

## 2011-02-23 DIAGNOSIS — N63 Unspecified lump in unspecified breast: Secondary | ICD-10-CM | POA: Insufficient documentation

## 2011-02-23 DIAGNOSIS — IMO0002 Reserved for concepts with insufficient information to code with codable children: Secondary | ICD-10-CM

## 2011-02-26 IMAGING — CR DG CHEST 2V
2 series · 2 of 2 positions shown · non-contrast
Comparison: 01/06/2009

CLINICAL DATA: Fever and left flank pain.  Cough.  History of
asthma.

CHEST - 2 VIEW

[view not recorded (1 of 2)]
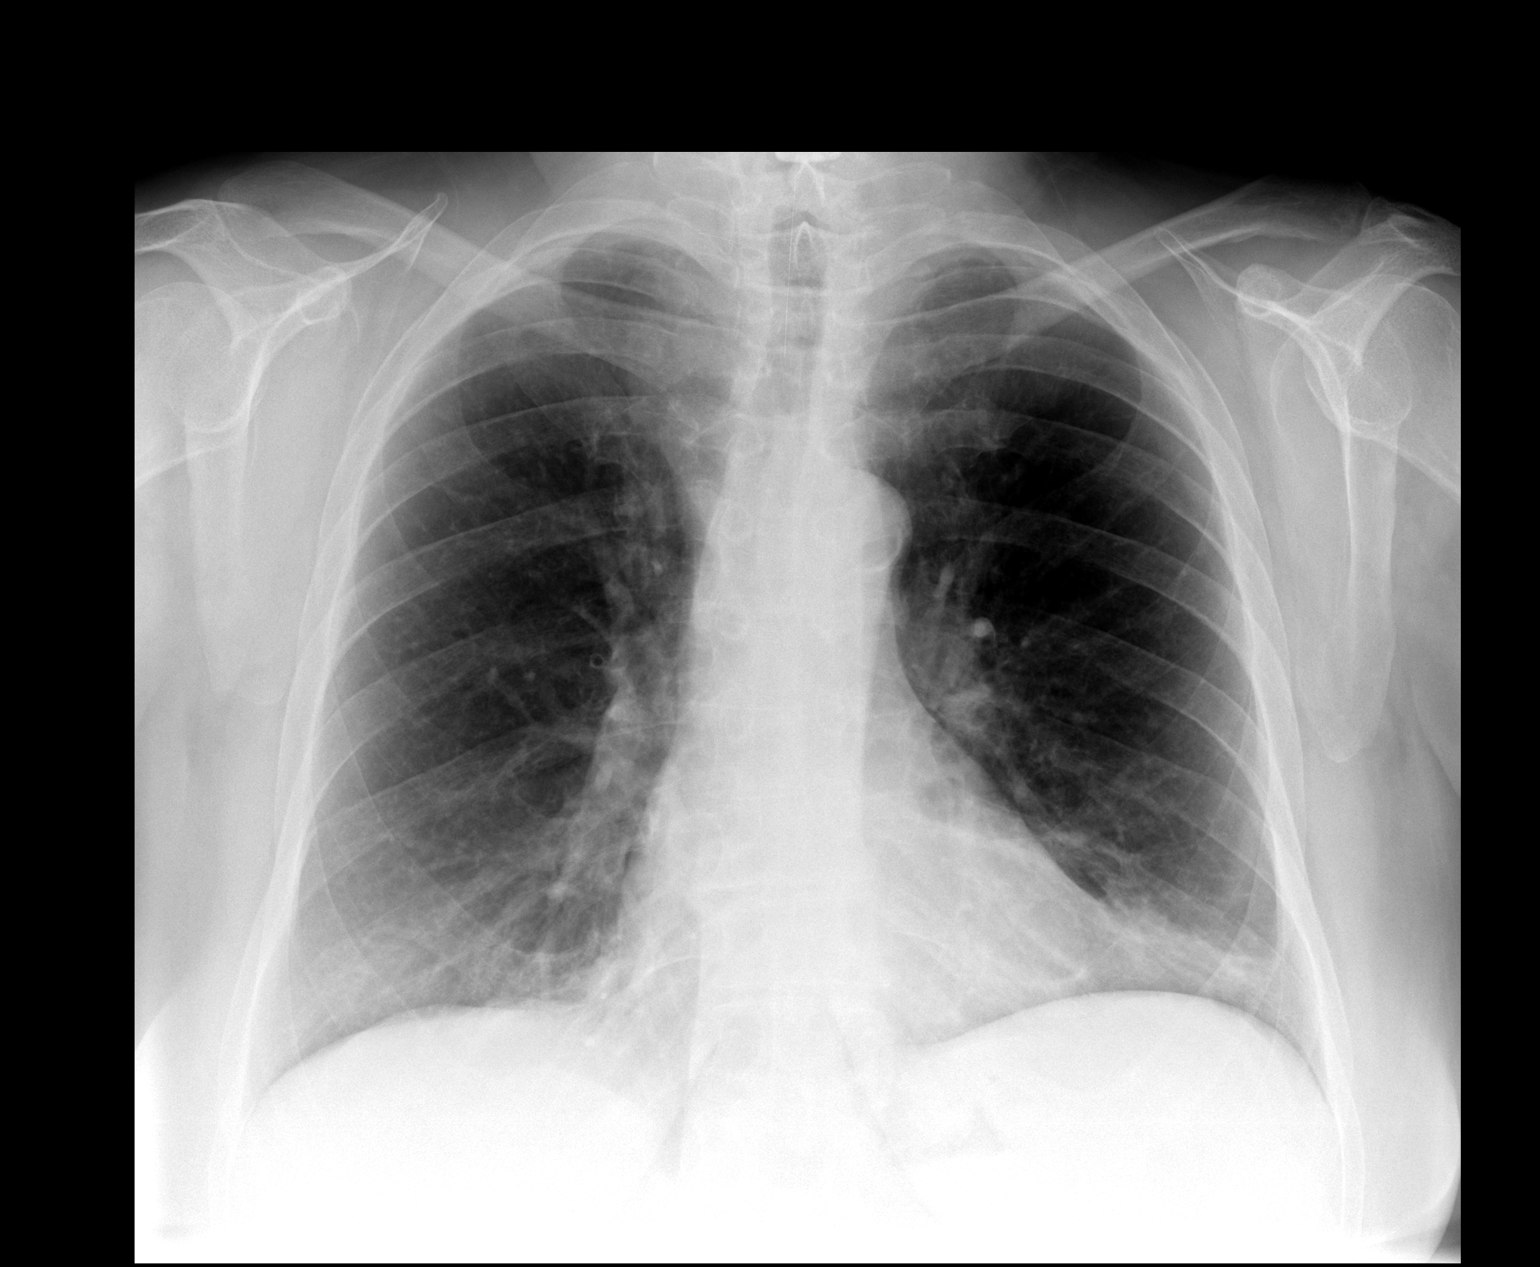

[view not recorded (2 of 2)]
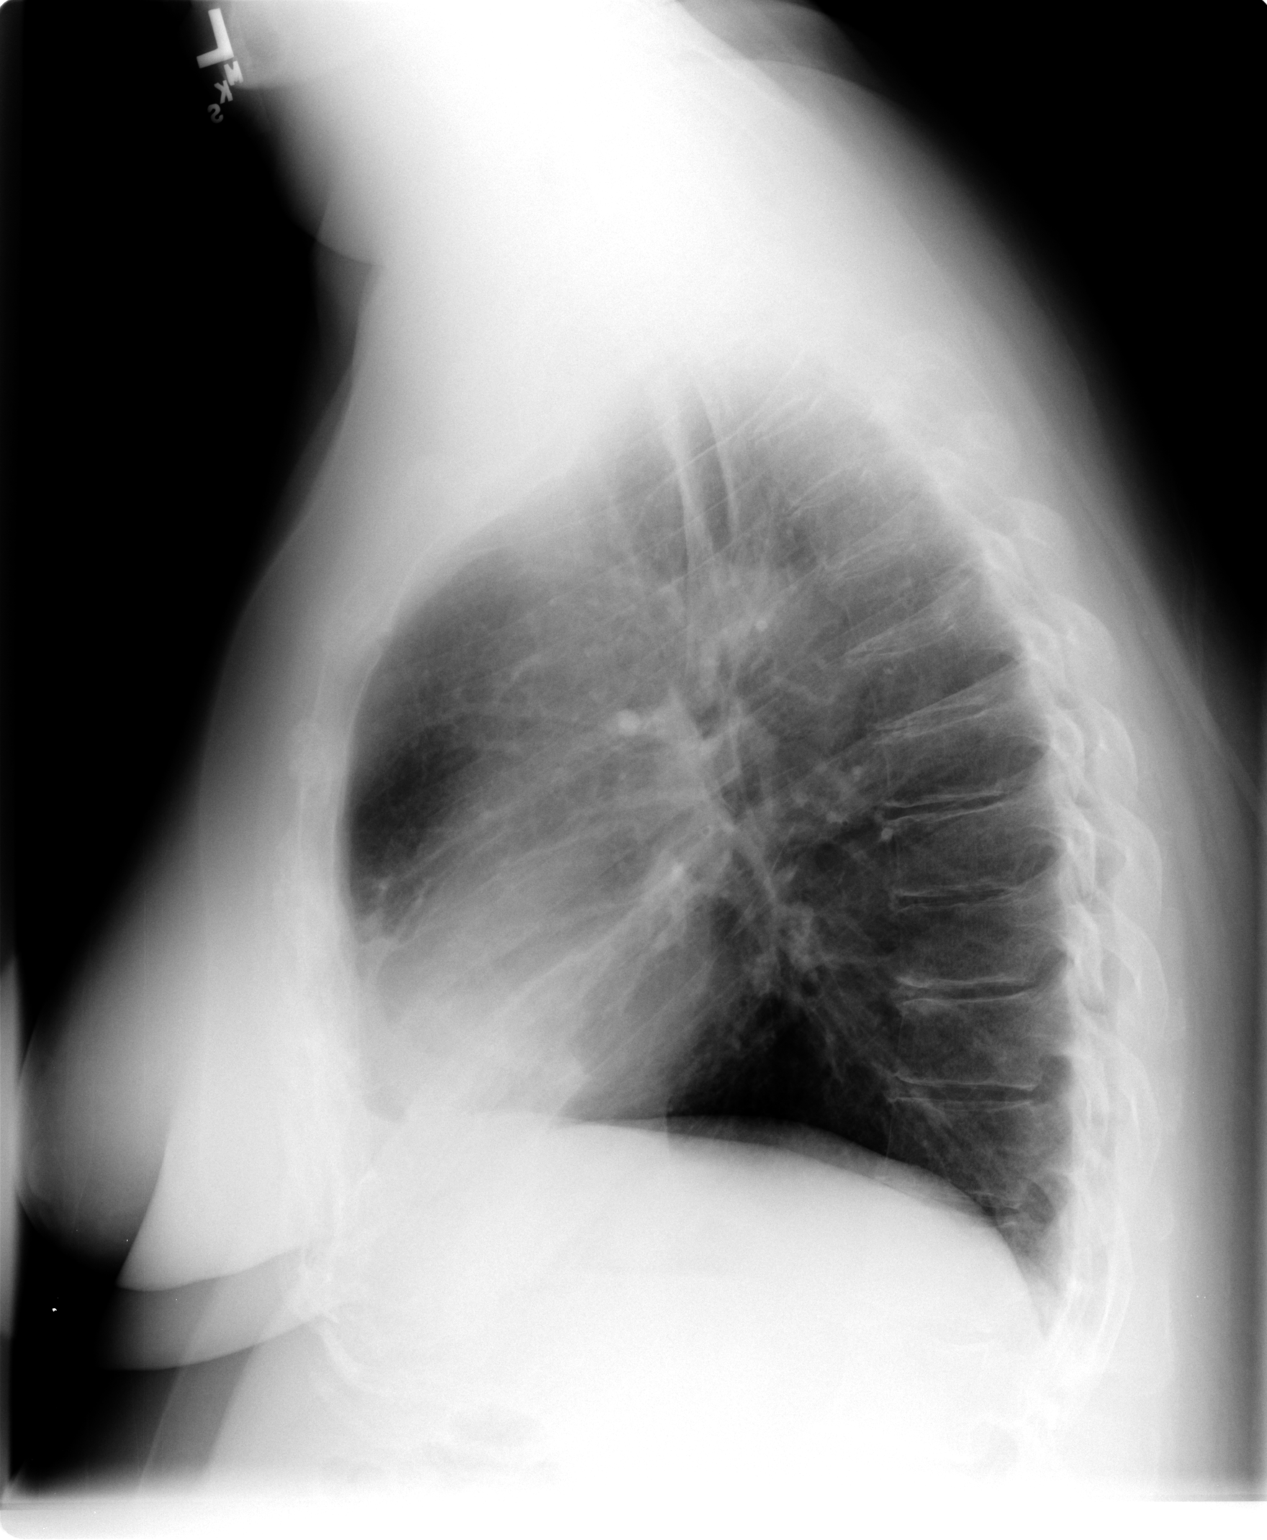

[2 of 2 positions shown; findings below may reference images not displayed]

FINDINGS: The heart size is normal.  Main pulmonary arteries are
prominent on a chronic basis.  There is chronic peribronchial
thickening with chronic bronchiectasis in the lingula and medial
aspect of the right middle lobe.  There are no acute infiltrates or
effusions.  No significant bony abnormality.  Upper lung zones are
lucent suggestive of COPD.
IMPRESSION: No acute abnormalities.  Chronic bronchitic and chronic
bronchiectasis changes.

## 2011-02-26 IMAGING — CT CT ABD-PELV W/ CM
1 of 3 series · 13 of 32 positions shown, 17 images · IV contrast (omnipaque)
Comparison: Right upper quadrant abdominal ultrasound performed
12/24/2004

CLINICAL DATA: Fever and left-sided abdominal pain; history of
diabetes.

CT ABDOMEN AND PELVIS WITH CONTRAST
TECHNIQUE: Multidetector CT imaging of the abdomen and pelvis was
performed following the standard protocol during bolus
administration of intravenous contrast.
Contrast: 80 mL of Omnipaque 300 IV contrast

[Series 6: abd_pel_with (person_name) thin 1.5 b40f · axial · 0.74mm/px · z∈[-416,-24]mm · 13 of 289 slices shown, 17 images]
[im 14/289  soft-tissue]
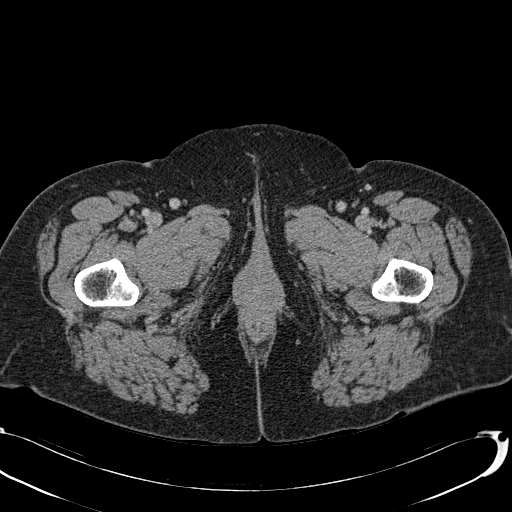
[im 14/289  bone]
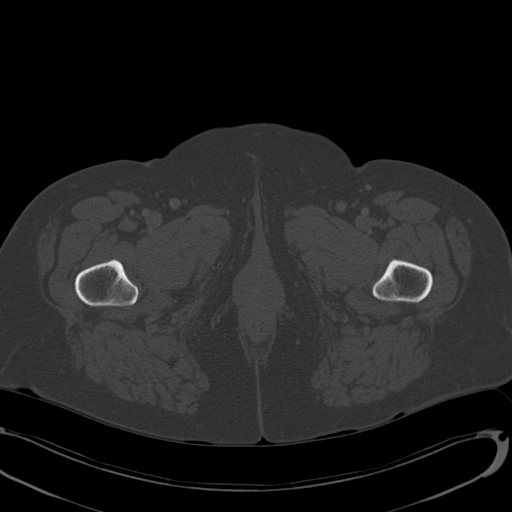
[im 42/289  soft-tissue]
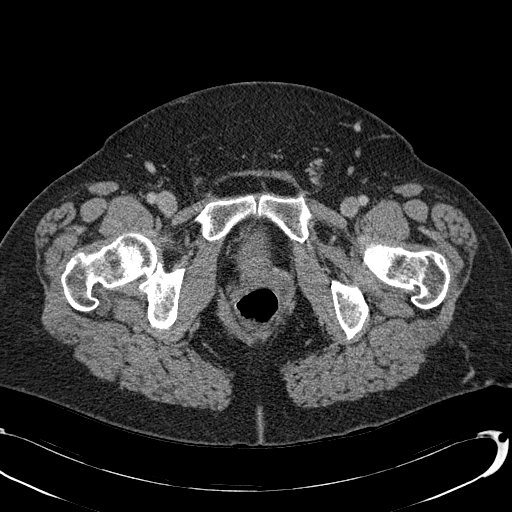
[im 69/289  soft-tissue]
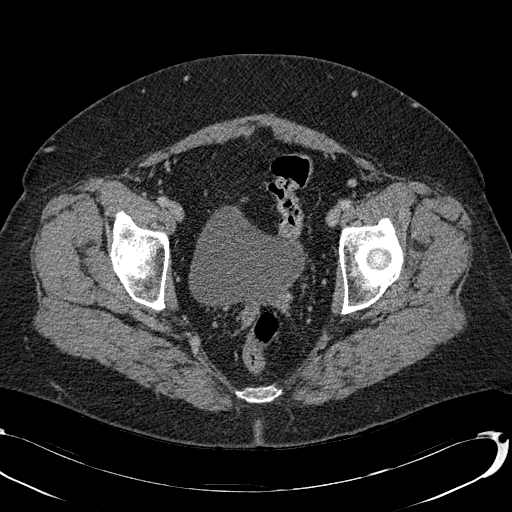
[im 97/289  soft-tissue]
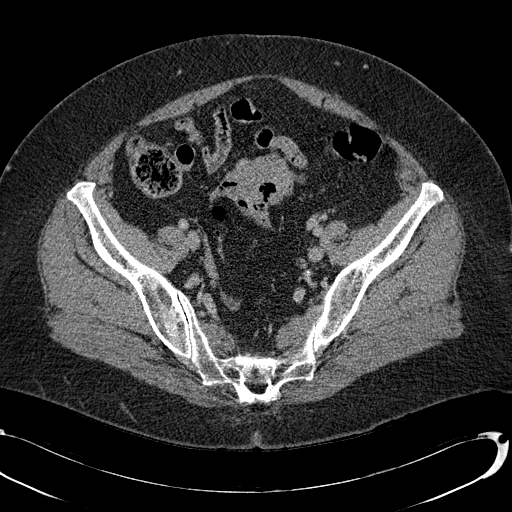
[im 124/289  soft-tissue]
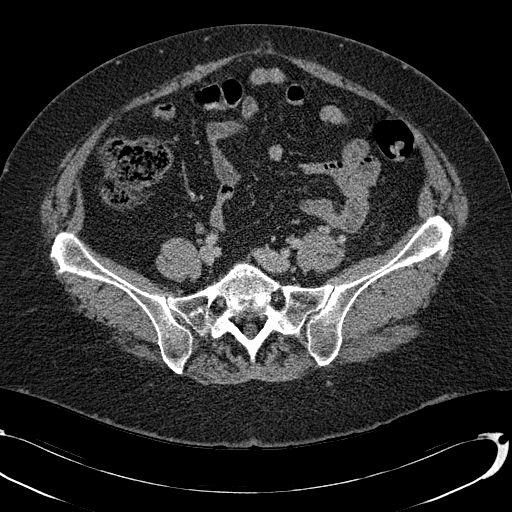
[im 151/289  soft-tissue]
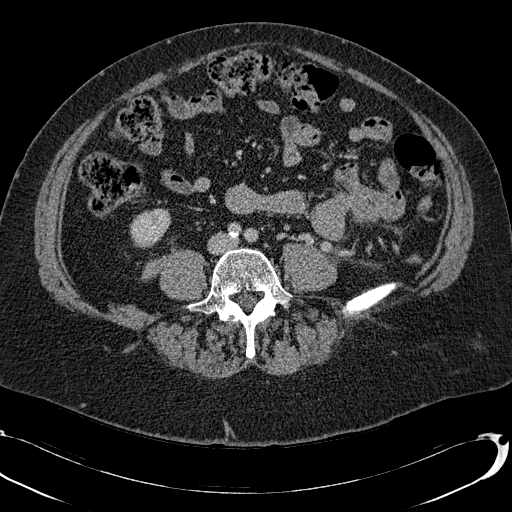
[im 165/289  soft-tissue]
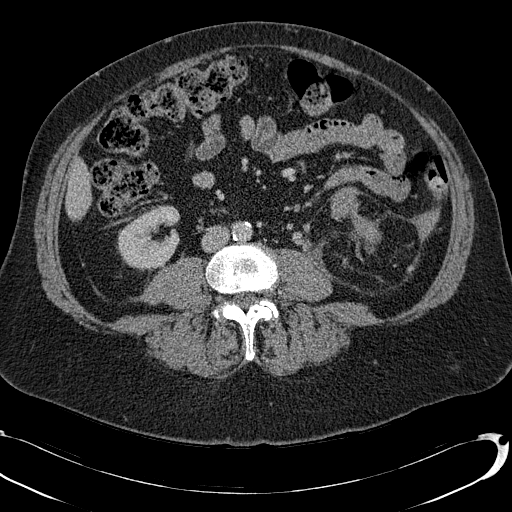
[im 193/289  soft-tissue]
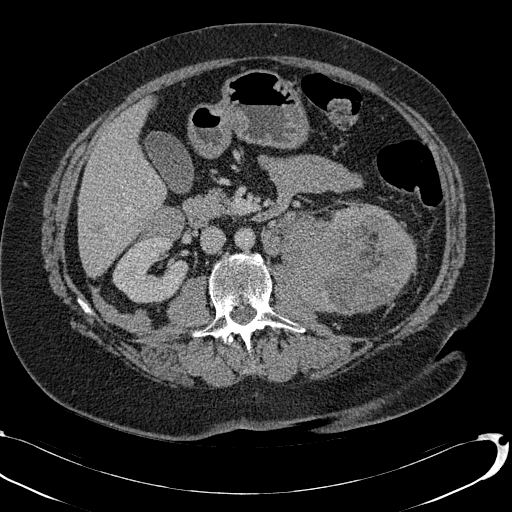
[im 220/289  soft-tissue]
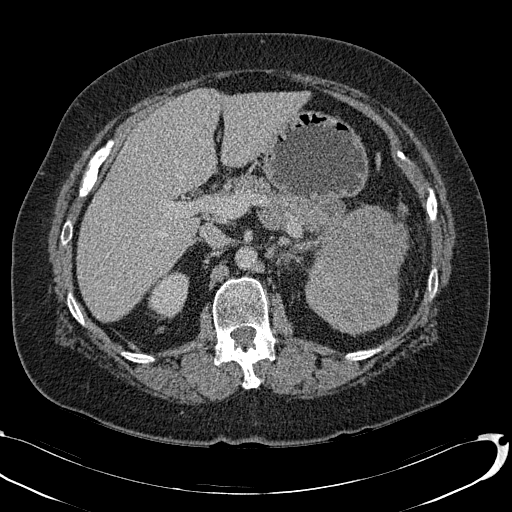
[im 220/289  bone]
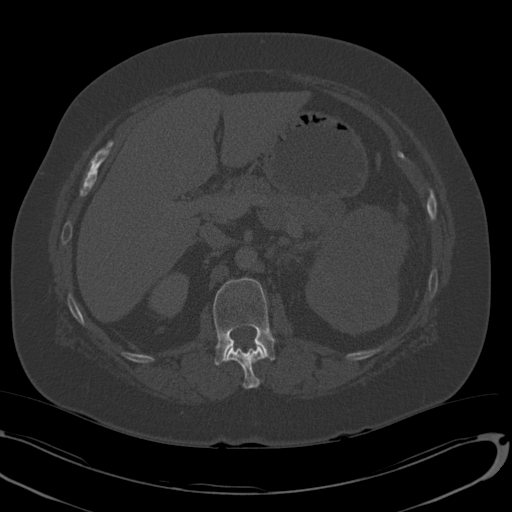
[im 234/289  lung]
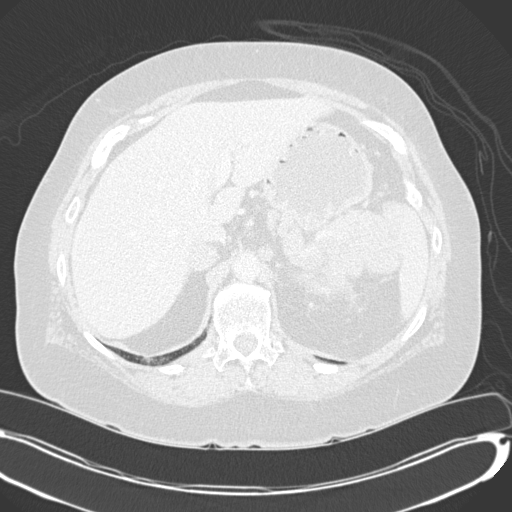
[im 247/289  soft-tissue]
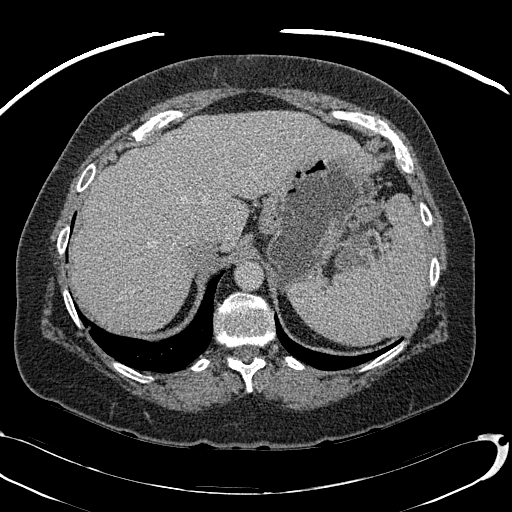
[im 247/289  lung]
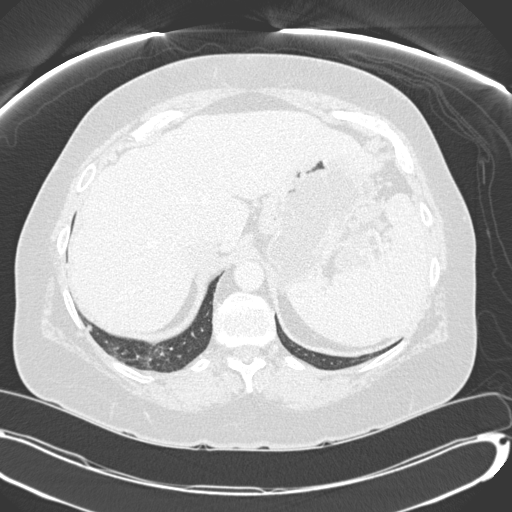
[im 261/289  lung]
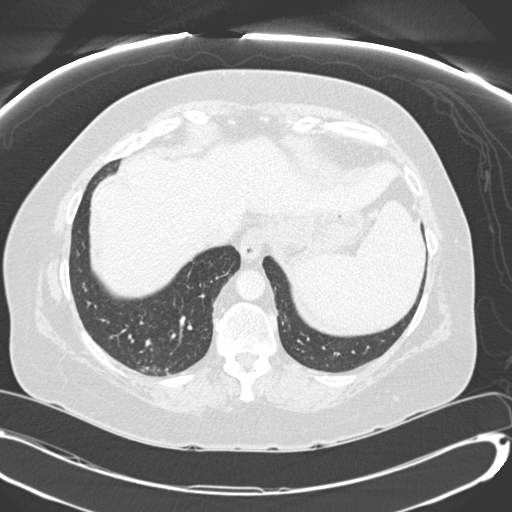
[im 275/289  soft-tissue]
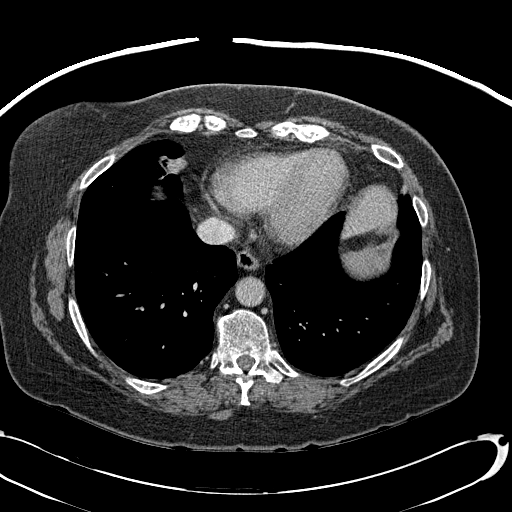
[im 275/289  lung]
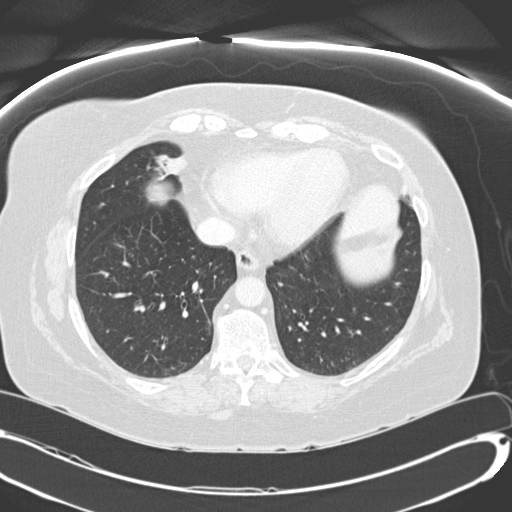

[13 of 32 positions shown; findings below may reference images not displayed]

FINDINGS: Mild focal right middle lobe and left lingular
atelectasis is noted.

There is a large bulky mildly heterogeneous mass arising from the
left kidney, measuring approximately 11.4 x 6.8 x 9.5 cm.  Central
areas of decreased attenuation may reflect necrosis, or distorted
calyceal architecture.  Infiltrative change is seen involving the
posterior and inferior aspects of the left kidney. Lack of
excretion of contrast from the left kidney on delayed images
suggests diffuse poorly characterized infiltration of the entirety
of the left kidney.

There is a smaller mass, also somewhat bulky in appearance, arising
from the upper pole of the right kidney, measuring 5.0 x 3.0 x
cm.  Multiple smaller nodular masses are seen surrounding the left
kidney, some of which are embedded within the retroperitoneal fat,
while others reflect periaortic and perisplenic nodes.

There is a small hypodense lesion noted within the left pancreatic
tail, measuring approximately 1.6 cm. Given that findings are most
compatible with metastatic disease, this could reflect the primary
malignancy.  Prominent peripancreatic nodes are noted.  No definite
mesenteric metastases are identified.

Within the liver, note is made of two hypodense lesions near the
inferior tip of the liver, within the right hepatic lobe, each
measuring approximately 1.2 cm in size.  These are difficult to
fully define on this study, but are most compatible with hepatic
metastases.  The spleen is unremarkable in appearance.

No omental metastases are identified.  The adrenal glands are
unremarkable in appearance.  No significant hydronephrosis is seen.

No acute vascular abnormalities are identified.  The stomach is
within normal limits.  The small bowel is unremarkable in
appearance.

The appendix is not characterized, there is no evidence for acute
appendicitis.  The colon is unremarkable in appearance.

The bladder is moderately distended and is within normal limits.
The patient is status post partial hysterectomy; the vaginal cuff
is unremarkable in appearance.  The ovaries are within normal
limits.  No suspicious adnexal masses are seen.  No inguinal
lymphadenopathy is appreciated.
IMPRESSION: 1.  Large bulky mildly heterogeneous mass arising from the left
kidney, measuring 11.4 x 6.8 x 9.5 cm, with diffuse infiltration of
the remainder of the left kidney, and associated lack of excretion
of contrast on delayed images.
2.  Smaller 5.0 x 3.0 x 2.5 cm mass arising from the upper pole of
the right kidney.
3.  Multiple soft tissue implants surrounding the left kidney,
mostly embedded within the retroperitoneal fat.
4.  Enlarged perisplenic and mildly prominent periaortic nodes
noted.
5. Two poorly characterized 1.2 cm hypodense lesions noted in the
right hepatic lobe, near the inferior tip of the liver.  These
likely reflect hepatic metastases.

Given bilateral renal and hepatic metastases, as well as soft
tissue implants, this is concerning for a highly aggressive primary
malignancy.  Lymphoma tends not to present in this manner.  There
is a 1.6 cm hypodense lesion within the pancreatic tail, and this
could reflect a metastatic pancreatic primary. Enlarged
peripancreatic nodes noted.
6.  Mild focal right middle lobe and left lingular atelectasis.

## 2011-02-27 IMAGING — CT CT CHEST W/O CM
2 of 3 series · 15 of 36 positions shown, 18 images · non-contrast
Comparison: CT abdomen and pelvis 07/06/2009

CLINICAL DATA: Left flank pain, nausea, vomiting, left renal mass,
history hypertension, diabetes, hysterectomy, benign right breast
surgery

CT CHEST WITHOUT CONTRAST
TECHNIQUE: Multidetector CT imaging of the chest was performed
following the standard protocol without IV contrast. Breast shield
utilized.  Sagittal and coronal MPR images reconstructed from axial
data set.

[Series 2: chestroutine 5.0 b40f · axial · 0.66mm/px · z∈[-293,-8]mm · 12 of 67 slices shown, 15 images]
[im 5/67  mediastinal]
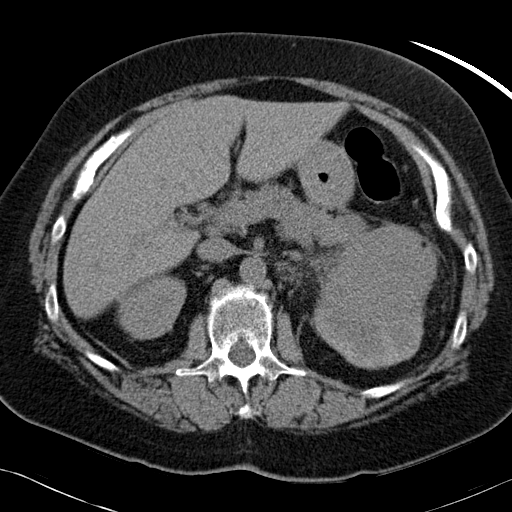
[im 5/67  lung]
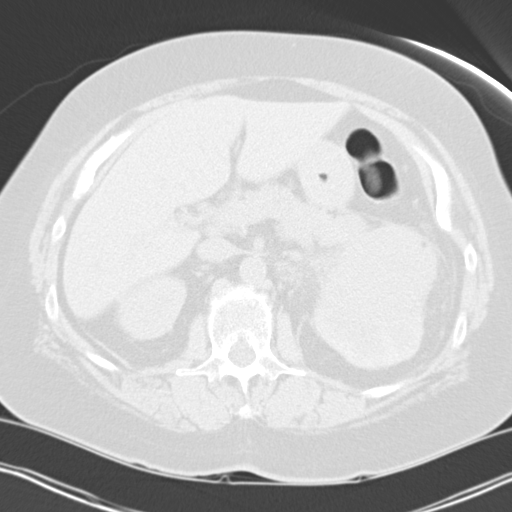
[im 10/67  lung]
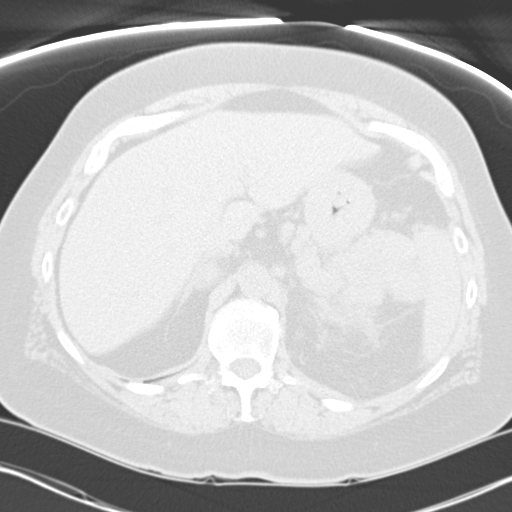
[im 15/67  lung]
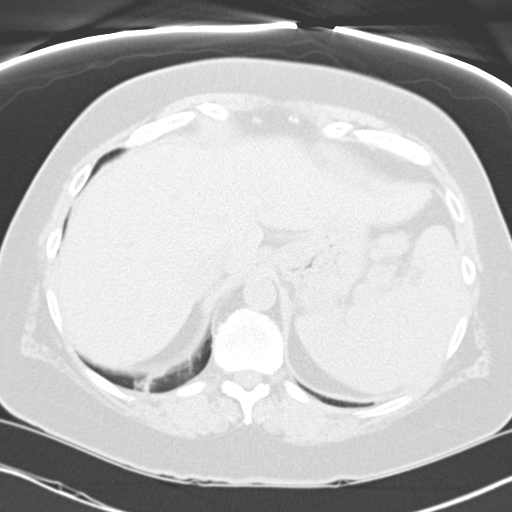
[im 20/67  lung]
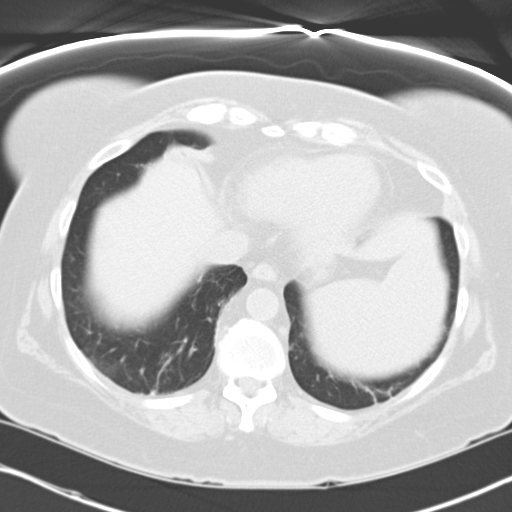
[im 25/67  mediastinal]
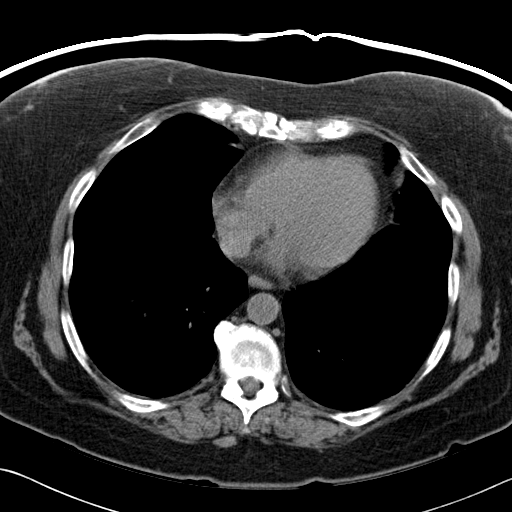
[im 25/67  lung]
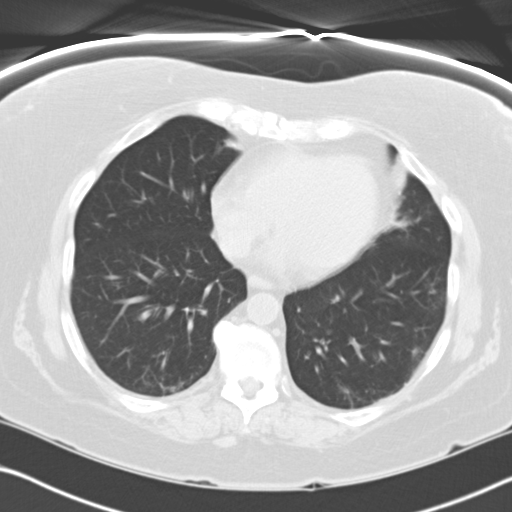
[im 30/67  lung]
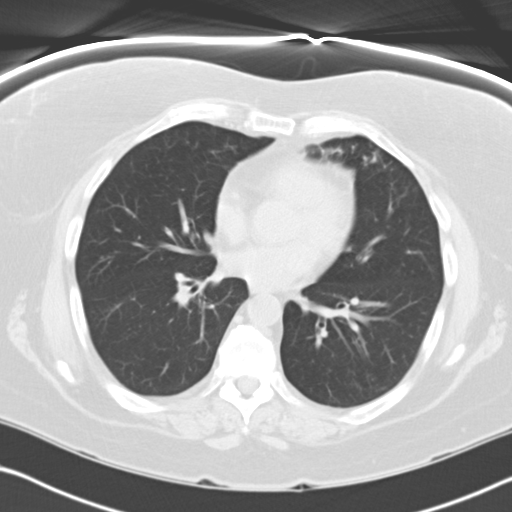
[im 37/67  lung]
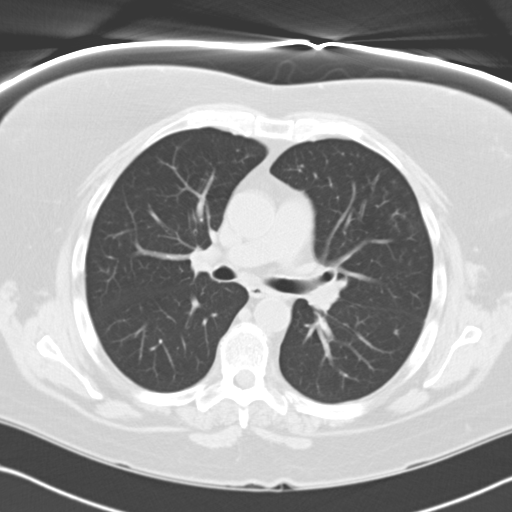
[im 42/67  lung]
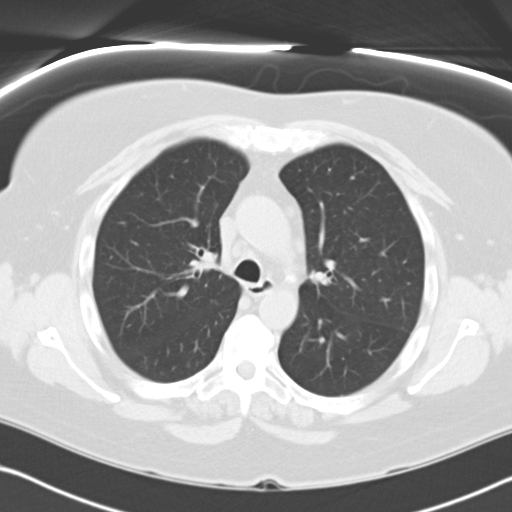
[im 47/67  mediastinal]
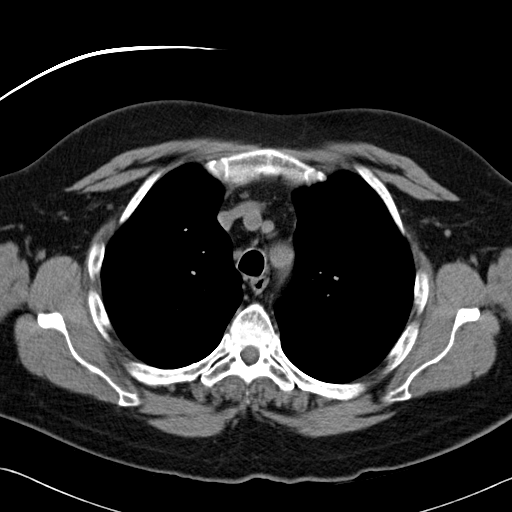
[im 47/67  lung]
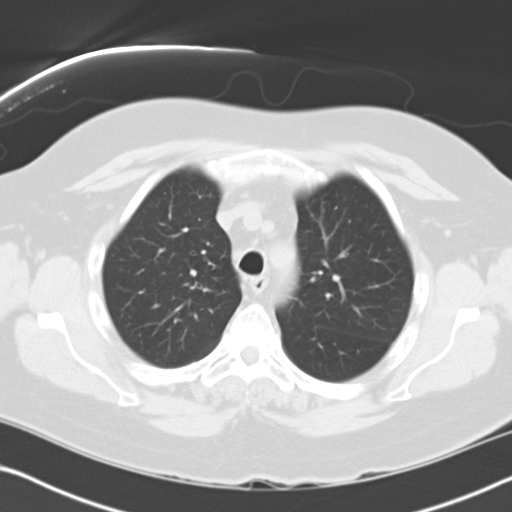
[im 52/67  lung]
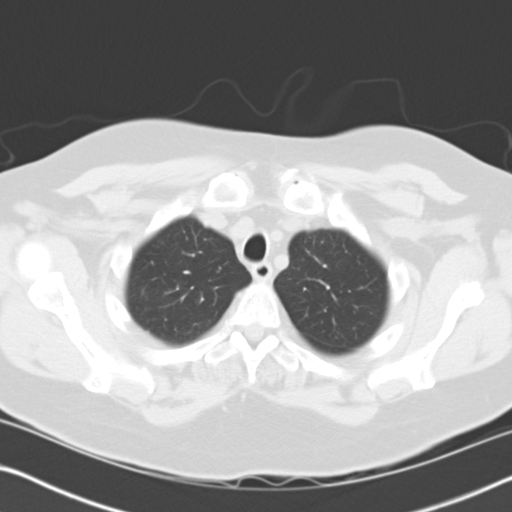
[im 57/67  lung]
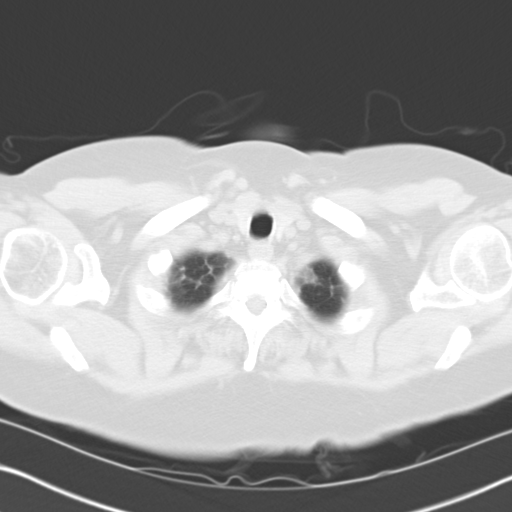
[im 62/67  lung]
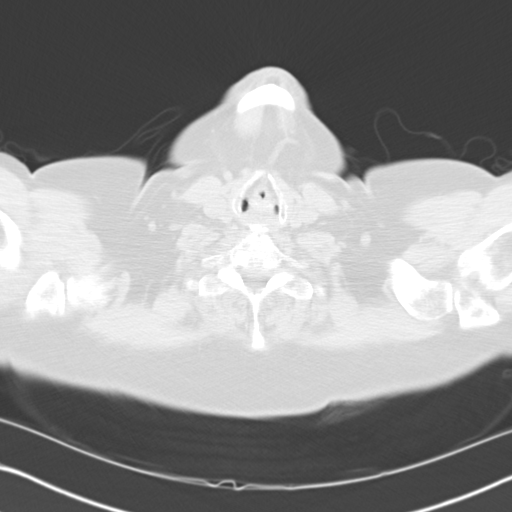

[Series 4: mpr coro 3mm · coronal · 0.62mm/px · 3 of 75 slices shown]
[im 15/75  lung]
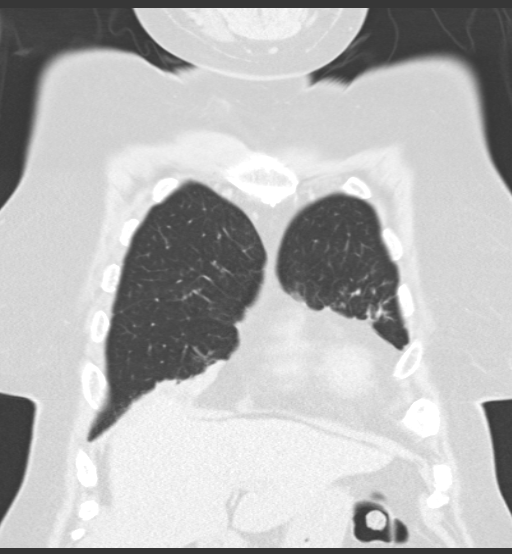
[im 30/75  lung]
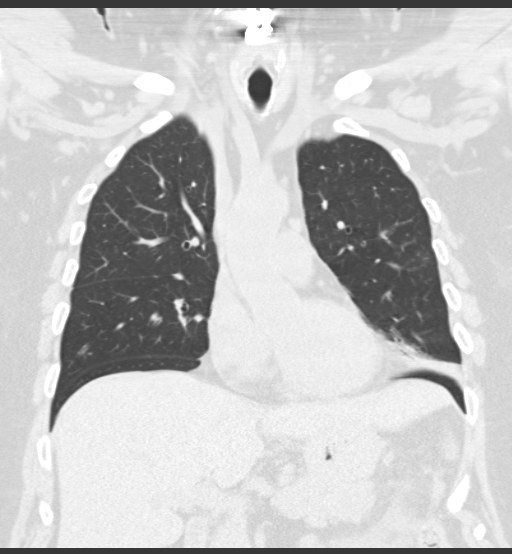
[im 45/75  lung]
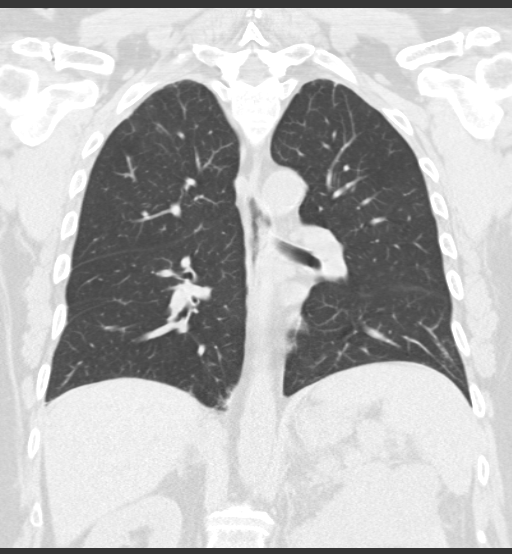

[15 of 36 positions shown; findings below may reference images not displayed]

FINDINGS: Left renal mass with gastrosplenic ligament and celiac/perigastric
adenopathy as noted on preceding CT abdomen.
Scattered normal-sized mediastinal lymph nodes, largest 12 x 8 mm
left periaortic image 25.
Minimal pericardial effusion or thickening.
Minimal atherosclerotic calcifications aortic arch.
Minimal atelectasis dependently in lower lobes and at medial right
middle lobe.
Few tiny nonspecific bilateral pulmonary nodules, less than 3 mm
diameter, too small to characterize.
No pulmonary infiltrate, dominant pulmonary mass, or pleural
effusion.
Prior cervical spine fusion.
No acute bony findings.
IMPRESSION: Tiny nonspecific pulmonary nodules too small to characterize.
Scattered normal-sized mediastinal lymph nodes.
Minimal pericardial fluid or thickening.
No dominant thoracic mass identified.
Please refer to CT abdomen report of 07/06/2009 for discussion of
left renal mass and left upper quadrant adenopathy.

## 2011-02-28 IMAGING — US US BIOPSY
1 series · 14 of 16 positions shown · non-contrast
Comparison: none

Clinical Data/Indication: Left renal mass

Ultrasound-guided biopsyof a left renal mass.  Core.
Sedation: Versed 2 mg, Fentanyl 100 mcg.
Total Moderate Sedation Time: Seven minutes.
Procedure: The procedure, risks, benefits, and alternatives were
explained to the patient. Questions regarding the procedure were
encouraged and answered. The patient understands and consents to
the procedure.
The back in the prone position was prepped with betadine in a
sterile fashion, and a sterile drape was applied covering the
operative field.
Under sonographic guidance, three 18 gauge core biopsies of the
left lower pole renal mass were obtained. Final imaging was
performed.
Patient tolerated the procedure well without complication.  Vital
sign monitoring by nursing staff during the procedure will continue
as patient is in the special procedures unit for post procedure
observation.

[Series 1: us biopsy · 0.35mm/px · 14 of 16 slices shown]
[im 1/16]
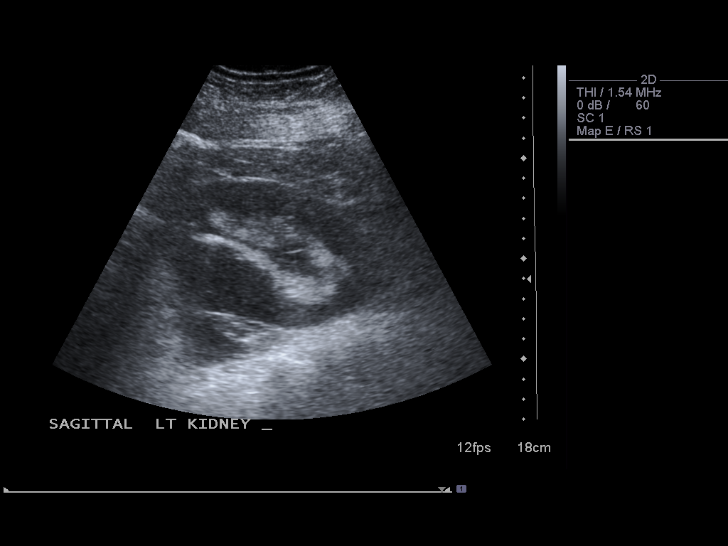
[im 2/16]
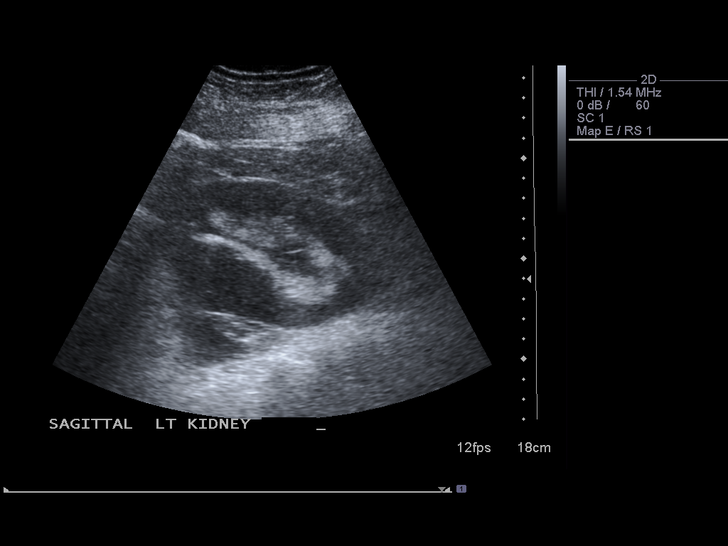
[im 3/16]
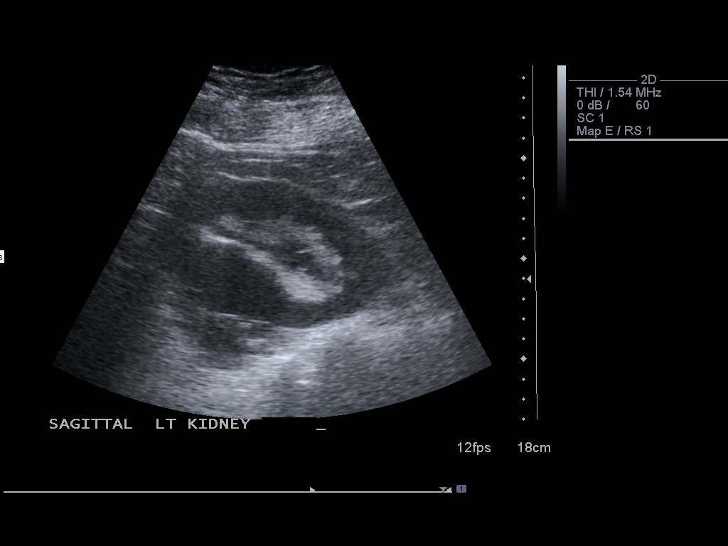
[im 5/16]
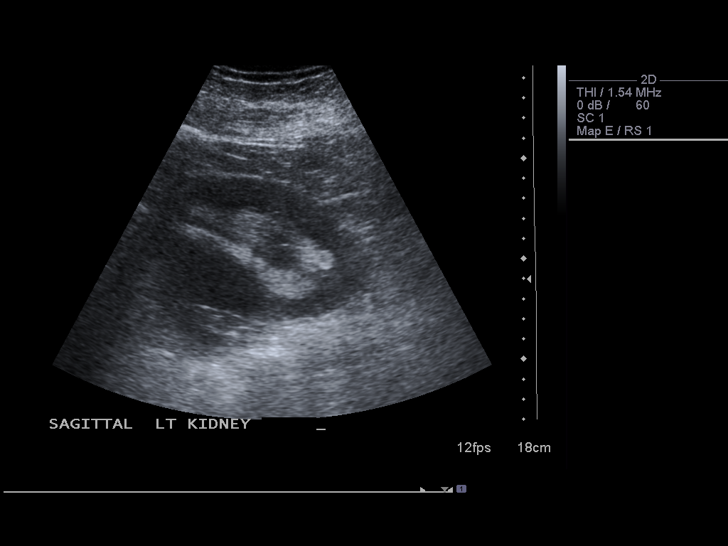
[im 6/16]
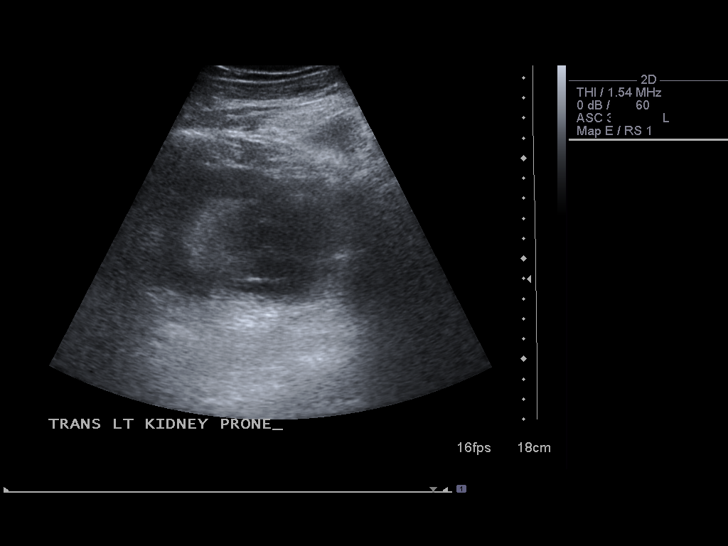
[im 7/16]
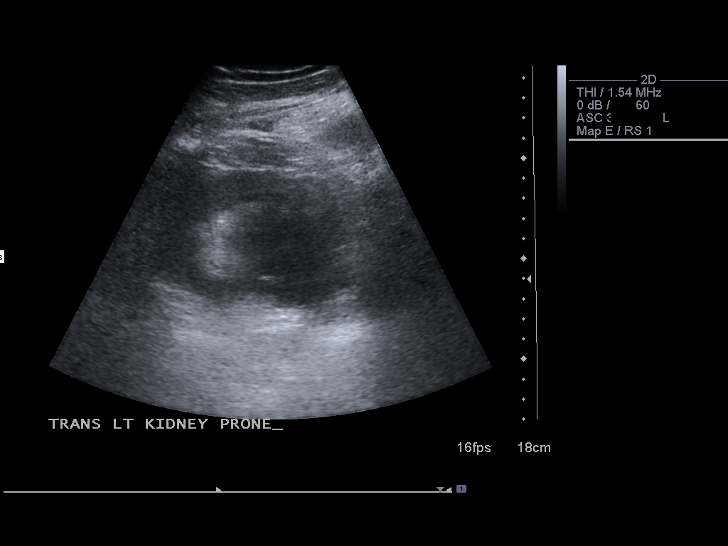
[im 8/16]
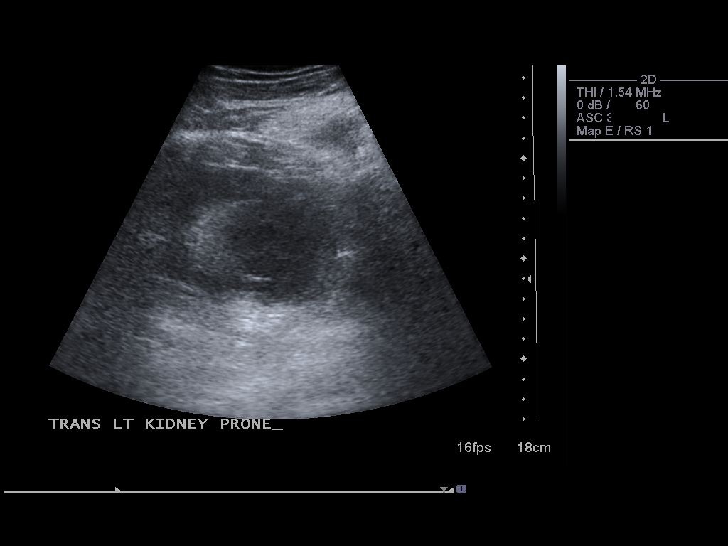
[im 9/16]
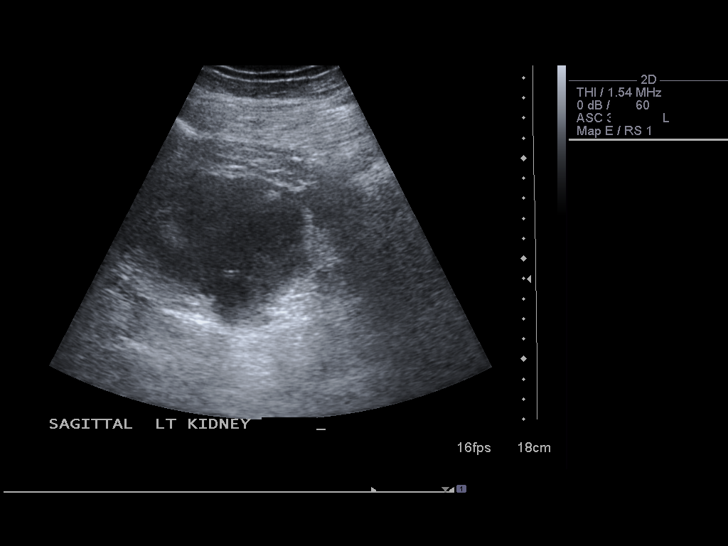
[im 10/16]
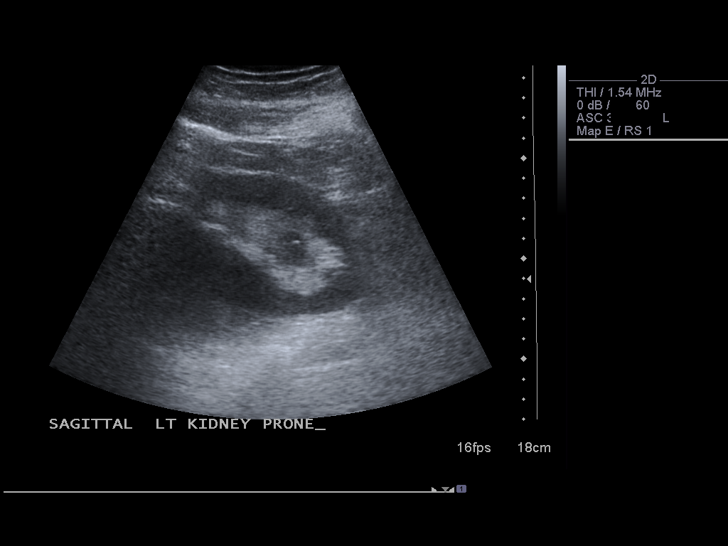
[im 11/16]
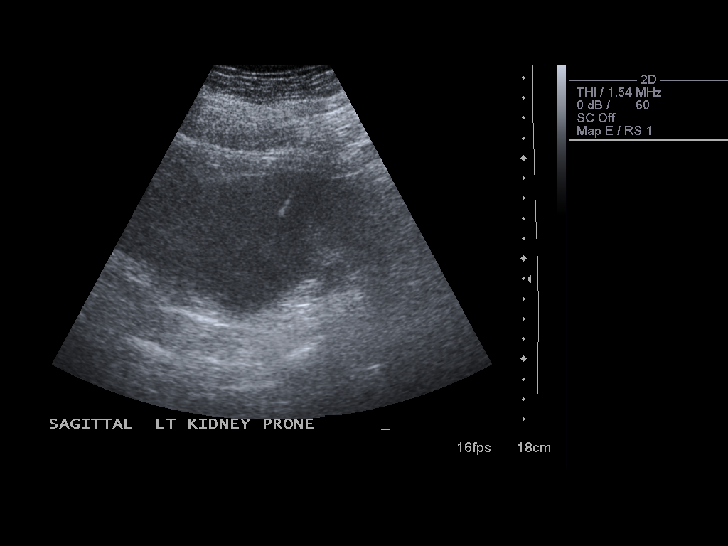
[im 13/16]
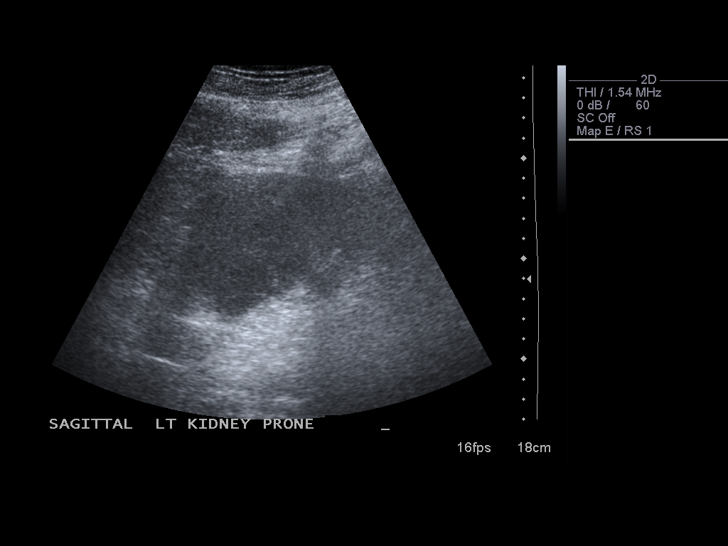
[im 14/16]
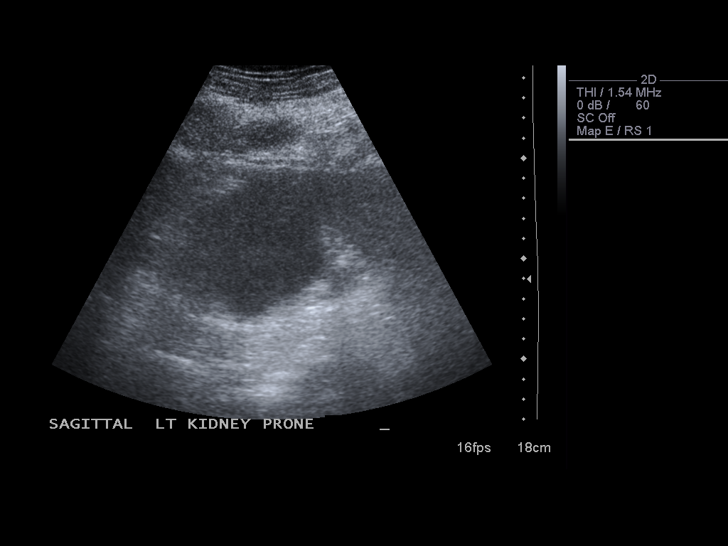
[im 15/16]
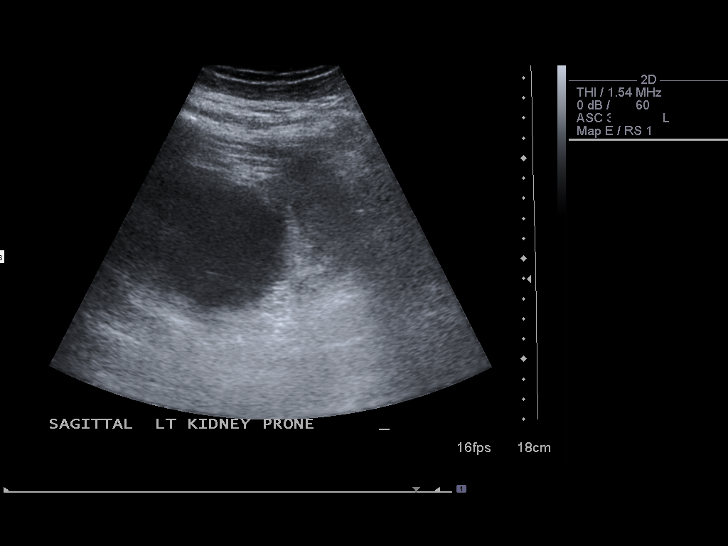
[im 16/16]
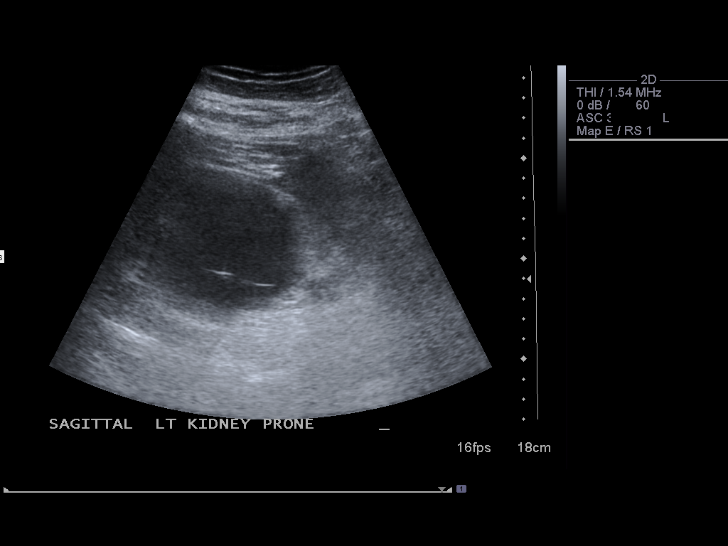

[14 of 16 positions shown; findings below may reference images not displayed]

FINDINGS: The images document guide needle placement within the
left lower pole renal mass. Post biopsy images demonstrate no
hemorrhage.
IMPRESSION: Successful ultrasound-guided core  biopsy of a left lower pole
renal mass.

## 2011-03-02 IMAGING — CT NM PET TUM IMG INITIAL (PI) SKULL BASE T - THIGH
6 series · 25 of 25 positions shown · IV contrast ([ID])
Comparison: No prior PET CT.  Diagnostic CT chest 07/07/2009.
Diagnostic CT abdomen pelvis 07/06/2009.

CLINICAL DATA: Initial treatment strategy for renal cell
carcinoma.  Large bulky left renal mass with extensive intra-
abdominal metastases on recent diagnostic CT abdomen pelvis.
Diagnostic CT chest demonstrate numerous tiny nonspecific nodules
and normal sized mediastinal lymph nodes.

NUCLEAR MEDICINE PET CT INITIAL (PI) SKULL BASE TO THIGH
07/10/2009:
TECHNIQUE: 14.5 mCi F-18 FDG was injected intravenously via the
right antecubital vein.  Full-ring PET imaging was performed from
the skull base through the mid-thighs 102  minutes after injection.
CT data was obtained and used for attenuation correction and
anatomic localization only.  (This was not acquired as a diagnostic
CT examination.)
Fasting Blood Glucose:  139

[Series 1: pet ac · axial · 3.3mm · 4.69mm/px · z∈[-870,+0]mm · 5 of 267 slices shown]
[im 1/267]
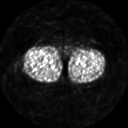
[im 67/267]
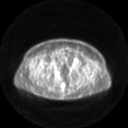
[im 134/267]
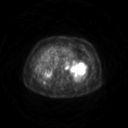
[im 200/267]
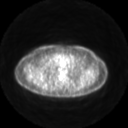
[im 267/267]
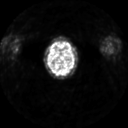

[Series 2: pet nac · axial · 3.3mm · 4.69mm/px · z∈[-870,+0]mm · 6 of 267 slices shown]
[im 1/267]
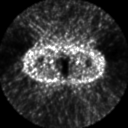
[im 54/267]
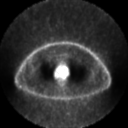
[im 107/267]
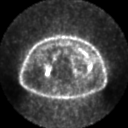
[im 160/267]
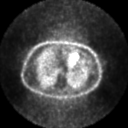
[im 213/267]
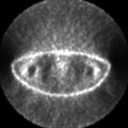
[im 267/267]
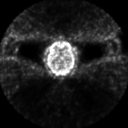

[Series 2: ct images · axial · 3.8mm · 0.98mm/px · z∈[-870,+0]mm · 6 of 263 slices shown]
[im 1/263]
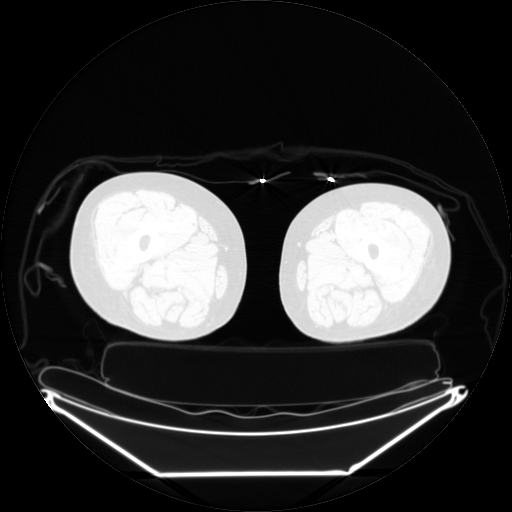
[im 53/263]
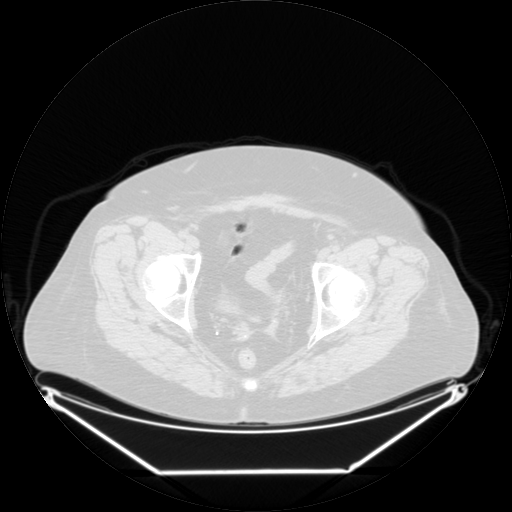
[im 105/263]
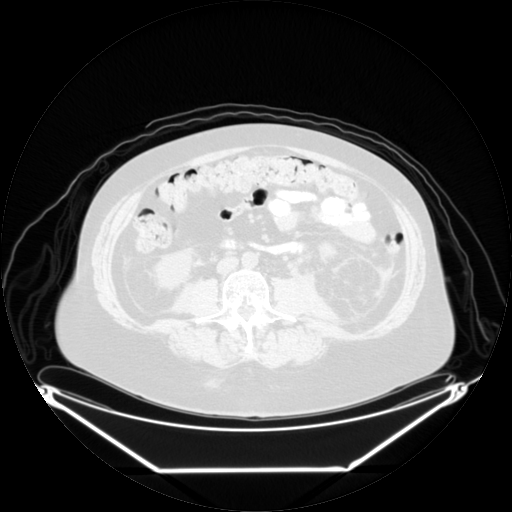
[im 158/263]
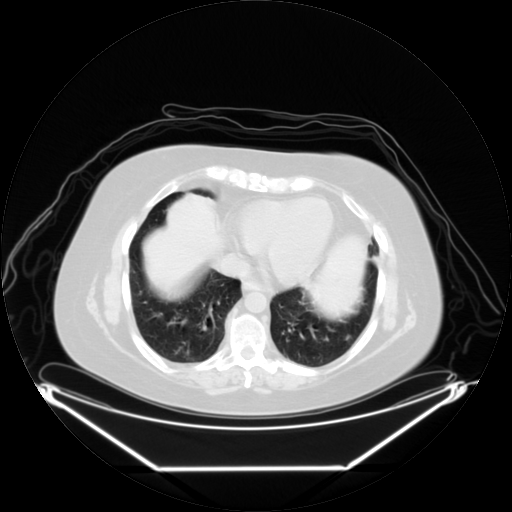
[im 210/263]
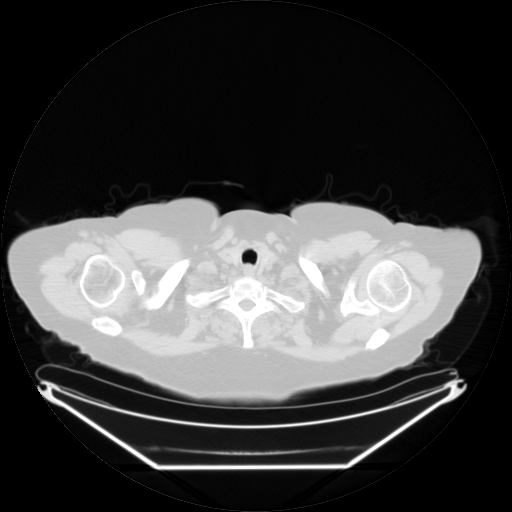
[im 263/263  brain]
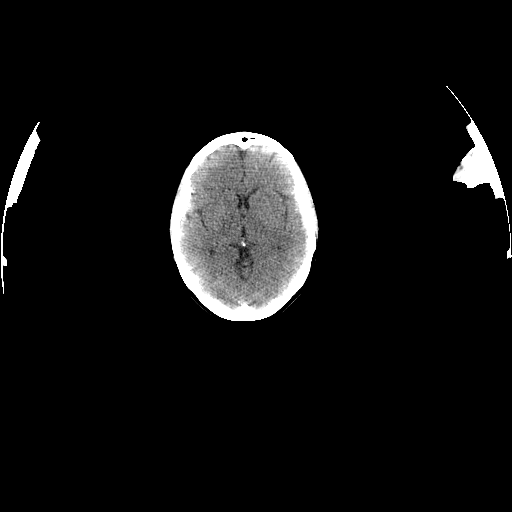

[Series 123: mip · coronal · 3.3mm · 4.69mm/px · 1 of 30 slices shown]
[im 1/30]
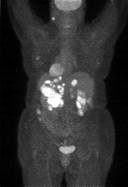

[Series 151: reformatted · axial · 3.3mm · 3.91mm/px · z∈[-870,+0]mm · 6 of 265 slices shown (1 of 2)]
[im 1/265]
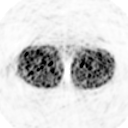
[im 53/265]
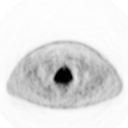
[im 106/265]
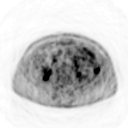
[im 159/265]
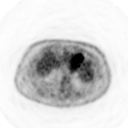
[im 212/265]
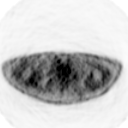
[im 265/265]
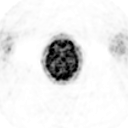

[Series 153: reformatted · coronal · 4.7mm · 6.98mm/px · 1 of 64 slices shown (2 of 2)]
[im 1/64]
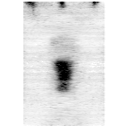

[25 of 25 positions shown; findings below may reference images not displayed]

FINDINGS: Marked hypermetabolic activity involving the large bulky
mass arising from the left kidney, SUV-max 24.4.  Extensive nodal
and peritoneal metastatic disease throughout the abdomen and upper
pelvis, index lesions and their SUV-max are as follows:

Mass situated between the right kidney and the right lobe of liver:
17.9
Mass involving the right psoas muscle inferior to the right kidney:
16.6
Gastrohepatic ligament lymph nodes:
Gastrosplenic ligament lymph nodes:
Mass just superior to the right adrenal gland, between it and the
liver:
Node at the esophagogastric junction:
Mass in the left paracolic gutter adjacent to the lower pole of the
left kidney:
Subcutaneous nodule or lymph node in the inferior left axilla:

A nodule in the left parotid gland has SUV-max 10.7.  No abnormal
hypermetabolic activity involving the pulmonary parenchyma.  No
mediastinal nodal metastatic disease.  No hypermetabolic osseous
metastatic disease.

Physiologic activity within the left ventricle and in the luminal
contents of the large and small bowel.  Physiologic excretion into
the urinary tract. Please see their report of the recent diagnostic
CT chest and CT abdomen and pelvis.  Unenhanced CT images of the
neck confirm the nodule in the inferior left parotid gland.
IMPRESSION: 1.  Extreme hypermetabolic activity within the large mass arising
from the left kidney consistent with the diagnosis of renal cell
carcinoma.
2.  Extensive metastatic adenopathy and peritoneal metastatic
disease in the abdomen and upper pelvis, with index metastases and
their SUV-max given above.
3.  Metastatic left inferior axillary lymph node.
4.  Hypermetabolic nodule in the inferior left parotid gland,
statistically more likely to represent a primary parotid tumor such
as a Warthin tumor rather than a metastatic intraparotid lymph
node.
5.  No evidence of hypermetabolic osseous metastatic disease.

## 2011-03-04 HISTORY — PX: BASAL CELL CARCINOMA EXCISION: SHX1214

## 2011-03-11 ENCOUNTER — Encounter (HOSPITAL_COMMUNITY): Payer: BC Managed Care – PPO | Attending: Oncology

## 2011-03-11 VITALS — BP 105/65 | HR 59 | Temp 99.7°F | Wt 172.0 lb

## 2011-03-11 DIAGNOSIS — Z5112 Encounter for antineoplastic immunotherapy: Secondary | ICD-10-CM

## 2011-03-11 DIAGNOSIS — D801 Nonfamilial hypogammaglobulinemia: Secondary | ICD-10-CM

## 2011-03-11 DIAGNOSIS — C8589 Other specified types of non-Hodgkin lymphoma, extranodal and solid organ sites: Secondary | ICD-10-CM | POA: Insufficient documentation

## 2011-03-11 LAB — DIFFERENTIAL
Basophils Absolute: 0 10*3/uL (ref 0.0–0.1)
Lymphocytes Relative: 5 % — ABNORMAL LOW (ref 12–46)
Monocytes Absolute: 0.6 10*3/uL (ref 0.1–1.0)
Neutro Abs: 11.5 10*3/uL — ABNORMAL HIGH (ref 1.7–7.7)
Neutrophils Relative %: 89 % — ABNORMAL HIGH (ref 43–77)

## 2011-03-11 LAB — CBC
HCT: 34.8 % — ABNORMAL LOW (ref 36.0–46.0)
Platelets: 195 10*3/uL (ref 150–400)
RDW: 15 % (ref 11.5–15.5)
WBC: 12.9 10*3/uL — ABNORMAL HIGH (ref 4.0–10.5)

## 2011-03-11 LAB — COMPREHENSIVE METABOLIC PANEL
ALT: 19 U/L (ref 0–35)
AST: 15 U/L (ref 0–37)
Alkaline Phosphatase: 99 U/L (ref 39–117)
CO2: 29 mEq/L (ref 19–32)
Chloride: 99 mEq/L (ref 96–112)
GFR calc non Af Amer: 53 mL/min — ABNORMAL LOW (ref >90)
Potassium: 3.8 mEq/L (ref 3.5–5.1)
Sodium: 138 mEq/L (ref 135–145)
Total Bilirubin: 0.3 mg/dL (ref 0.3–1.2)

## 2011-03-11 MED ORDER — SODIUM CHLORIDE 0.9 % IV SOLN
375.0000 mg/m2 | Freq: Once | INTRAVENOUS | Status: AC
  Administered 2011-03-11: 700 mg via INTRAVENOUS
  Filled 2011-03-11: qty 70

## 2011-03-11 MED ORDER — SODIUM CHLORIDE 0.9 % IJ SOLN
10.0000 mL | INTRAMUSCULAR | Status: DC | PRN
  Filled 2011-03-11: qty 10

## 2011-03-11 MED ORDER — SODIUM CHLORIDE 0.9 % IV SOLN
Freq: Once | INTRAVENOUS | Status: AC
  Administered 2011-03-11: 09:00:00 via INTRAVENOUS

## 2011-03-11 NOTE — Progress Notes (Signed)
Patient states that she took her Tylenol and Benadryl at home 1 hour prior to coming for treatment.  Rituxan titrated as follows:    8ml/hr for 30 min.                                                64ml/hr for 30 min.                                               166ml/hr for 30 min.                                               13ml/hr for remainder of infusion

## 2011-03-14 ENCOUNTER — Telehealth (HOSPITAL_COMMUNITY): Payer: Self-pay

## 2011-03-14 NOTE — Telephone Encounter (Signed)
Does not answer phone.

## 2011-03-16 ENCOUNTER — Ambulatory Visit (INDEPENDENT_AMBULATORY_CARE_PROVIDER_SITE_OTHER): Payer: BC Managed Care – PPO | Admitting: Critical Care Medicine

## 2011-03-16 ENCOUNTER — Encounter: Payer: Self-pay | Admitting: Critical Care Medicine

## 2011-03-16 VITALS — BP 120/66 | HR 64 | Temp 98.3°F | Ht 62.0 in | Wt 174.0 lb

## 2011-03-16 DIAGNOSIS — C8589 Other specified types of non-Hodgkin lymphoma, extranodal and solid organ sites: Secondary | ICD-10-CM

## 2011-03-16 DIAGNOSIS — J45909 Unspecified asthma, uncomplicated: Secondary | ICD-10-CM

## 2011-03-16 NOTE — Patient Instructions (Signed)
No change in medications. Return in   3 months 

## 2011-03-16 NOTE — Progress Notes (Signed)
Subjective:    Patient ID: [name], female    DOB: [birth year], 56 y.o.   MRN: [id number]  HPI  56 y.o.WF  Asthma , hypogammaglobulinemia, chronic sinusits d/t pseudomonas    02/14/11 Was given IgG IV per heme/onc.  Now feels better.  No real cough except in early am. Pt sneezing and yellow mucus over the past week.    Now off ABX [facility] further ABX  3/13 No real change from last ov.  Sl yellow mucus in the am.  Still gets gammaglob IV per heme/onc. Pt to receive Crx this week.  Rituxan is the only agent . No real wheeze.  No chest pains.  No excess Saba  Tolerated bactrim well 2/13 and helped sinus disease   PUL ASTHMA HISTORY 03/16/2011 08/11/2010  Symptoms 0-2 days/week Daily  Nighttime awakenings 0-2/month 3-4/month  Interference with activity No limitations Minor limitations  SABA use 0-2 days/wk > 2 days/wk--not > 1 x/day  Exacerbations requiring oral steroids 0-1 / year 0-1 / year      Past Medical History  Diagnosis Date  . Non Hodgkin's lymphoma   . Allergic rhinitis   . GERD (gastroesophageal reflux disease)   . Asthma   . Chronic sinusitis   . Respiratory failure   . Cavitary lung disease      Family History  Problem Relation Age of Onset  . Emphysema Mother   . Allergies Father   . Asthma Father     as a child  . Leukemia Maternal Grandmother   . Diabetes Brother   . Stroke Mother      History   Social History  . Marital Status: Divorced    Spouse Name: N/A    Number of Children: 3  . Years of Education: N/A   Occupational History  . [employer] Service Manager    Social History Main Topics  . Smoking status: Never Smoker   . Smokeless tobacco: Never Used  . Alcohol Use: No  . Drug Use: Not on file  . Sexually Active: Not on file   Other Topics Concern  . Not on file   Social History Narrative  . No narrative on file     Allergies  Allergen Reactions  . Meperidine Hcl   . Montelukast Sodium      Outpatient  Prescriptions Prior to Visit  Medication Sig Dispense Refill  . acyclovir (ZOVIRAX) 200 MG capsule 2 capsules twice per day       . albuterol (PROVENTIL) (2.5 MG/3ML) 0.083% nebulizer solution Take 2.5 mg by nebulization every 4 (four) hours as needed.  120 mL  6  . albuterol (VENTOLIN HFA) 108 (90 BASE) MCG/ACT inhaler Inhale 2 puffs into the lungs every 6 (six) hours as needed.        . citalopram (CELEXA) 20 MG tablet Take 20 mg by mouth every morning.        [name] Kitchen EPINEPHrine (EPIPEN IJ) As needed for severe allergic reaction       . furosemide (LASIX) 20 MG tablet Take 1 tablet by mouth as needed.      . Mometasone Furo-Formoterol Fum 200-5 MCG/ACT AERO Inhale 2 Inhalers into the lungs 2 (two) times daily.  1 Inhaler  6  . omeprazole (PRILOSEC) 20 MG capsule Take 1 capsule (20 mg total) by mouth 2 (two) times daily.  60 capsule  6  . potassium chloride 40 MEQ/15ML (20%) LIQD 2 tablespoons twice daily       .  predniSONE (DELTASONE) 10 MG tablet Take 5 mg by mouth daily.        . simvastatin (ZOCOR) 80 MG tablet Take 80 mg by mouth at bedtime.        [name] Kitchen warfarin (COUMADIN) 10 MG tablet Take by mouth daily. As directed.  Currently taking 7mg  doses.      . zafirlukast (ACCOLATE) 20 MG tablet Take 1 tablet (20 mg total) by mouth 2 (two) times daily.  60 tablet  5     Review of Systems  Constitutional:   No  weight loss, night sweats,  Fevers, chills, fatigue, lassitude. HEENT:   No headaches,  Difficulty swallowing,  Tooth/dental problems,  Sore throat,                No sneezing, itching, ear ache, nasal congestion, post nasal drip,   CV:  No chest pain,  Orthopnea, PND, swelling in lower extremities, anasarca, dizziness, palpitations  GI  No heartburn, indigestion, abdominal pain, nausea, vomiting, diarrhea, change in bowel habits, loss of appetite  Resp: No shortness of breath with exertion or at rest.  No excess mucus, notes  productive cough,  No non-productive cough,  No coughing up of  blood.  Notes  change in color of mucus.  No wheezing.  No chest wall deformity  Skin: no rash or lesions.  GU: no dysuria, change in color of urine, no urgency or frequency.  No flank pain.  MS:  No joint pain or swelling.  No decreased range of motion.  No back pain.  Psych:  No change in mood or affect. No depression or anxiety.  No memory loss.     Objective:   Physical Exam  Filed Vitals:   03/16/11 1041  BP: 120/66  Pulse: 64  Temp: 98.3 F (36.8 C)  TempSrc: Oral  Height: 5\' 2"  (1.575 m)  Weight: 174 lb (78.926 kg)  SpO2: 93%    Gen: Pleasant, well-nourished, in no distress,  normal affect  ENT: No lesions,  mouth clear,  oropharynx clear, no postnasal drip, purulence Resolved in both nares  Neck: No JVD, no TMG, no carotid bruits  Lungs: No use of accessory muscles, no dullness to percussion, no wheeze  Cardiovascular: RRR, heart sounds normal, no murmur or gallops, no peripheral edema  Abdomen: soft and NT, no HSM,  BS normal  Musculoskeletal: No deformities, no cyanosis or clubbing  Neuro: alert, non focal  Skin: Warm, no lesions or rashes      Assessment & Plan:   Extrinsic asthma, unspecified Severe persistent asthma with hx of chronic sinusitis d/t pseudomonas, now improved after bactrim po Rx Plan No change in inhaled or maintenance medications.      Updated Medication List Outpatient Encounter Prescriptions as of 03/16/2011  Medication Sig Dispense Refill  . acyclovir (ZOVIRAX) 200 MG capsule 2 capsules twice per day       . albuterol (PROVENTIL) (2.5 MG/3ML) 0.083% nebulizer solution Take 2.5 mg by nebulization every 4 (four) hours as needed.  120 mL  6  . albuterol (VENTOLIN HFA) 108 (90 BASE) MCG/ACT inhaler Inhale 2 puffs into the lungs every 6 (six) hours as needed.        . citalopram (CELEXA) 20 MG tablet Take 20 mg by mouth every morning.        [name] Kitchen EPINEPHrine (EPIPEN IJ) As needed for severe allergic reaction       . furosemide  (LASIX) 20 MG tablet Take 1 tablet by mouth as  needed.      . Mometasone Furo-Formoterol Fum 200-5 MCG/ACT AERO Inhale 2 Inhalers into the lungs 2 (two) times daily.  1 Inhaler  6  . omeprazole (PRILOSEC) 20 MG capsule Take 1 capsule (20 mg total) by mouth 2 (two) times daily.  60 capsule  6  . potassium chloride 40 MEQ/15ML (20%) LIQD 2 tablespoons twice daily       . predniSONE (DELTASONE) 10 MG tablet Take 5 mg by mouth daily.        . simvastatin (ZOCOR) 80 MG tablet Take 80 mg by mouth at bedtime.        [name] Kitchen warfarin (COUMADIN) 10 MG tablet Take by mouth daily. As directed.  Currently taking 7mg  doses.      . zafirlukast (ACCOLATE) 20 MG tablet Take 1 tablet (20 mg total) by mouth 2 (two) times daily.  60 tablet  5

## 2011-03-17 NOTE — Assessment & Plan Note (Signed)
Severe persistent asthma with hx of chronic sinusitis d/t pseudomonas, now improved after bactrim po Rx Plan No change in inhaled or maintenance medications.

## 2011-03-18 ENCOUNTER — Encounter (HOSPITAL_BASED_OUTPATIENT_CLINIC_OR_DEPARTMENT_OTHER): Payer: BC Managed Care – PPO

## 2011-03-18 VITALS — BP 124/78 | HR 74 | Temp 97.8°F | Wt 172.0 lb

## 2011-03-18 DIAGNOSIS — D801 Nonfamilial hypogammaglobulinemia: Secondary | ICD-10-CM

## 2011-03-18 DIAGNOSIS — Z5112 Encounter for antineoplastic immunotherapy: Secondary | ICD-10-CM

## 2011-03-18 DIAGNOSIS — C8589 Other specified types of non-Hodgkin lymphoma, extranodal and solid organ sites: Secondary | ICD-10-CM

## 2011-03-18 MED ORDER — SODIUM CHLORIDE 0.9 % IV SOLN
375.0000 mg/m2 | Freq: Once | INTRAVENOUS | Status: AC
  Administered 2011-03-18: 700 mg via INTRAVENOUS
  Filled 2011-03-18: qty 70

## 2011-03-18 MED ORDER — SODIUM CHLORIDE 0.9 % IJ SOLN
10.0000 mL | INTRAMUSCULAR | Status: DC | PRN
  Filled 2011-03-18: qty 10

## 2011-03-18 MED ORDER — SODIUM CHLORIDE 0.9 % IV SOLN
Freq: Once | INTRAVENOUS | Status: AC
  Administered 2011-03-18: 09:00:00 via INTRAVENOUS

## 2011-03-18 MED ORDER — SODIUM CHLORIDE 0.9 % IJ SOLN
INTRAMUSCULAR | Status: AC
  Filled 2011-03-18: qty 20

## 2011-03-18 NOTE — Progress Notes (Signed)
Rituxan infusion completed, patient tolerated well.    Infusion titrated as follows:  74ml/hr for 30 minutes                                              22ml/hr for 30 minutes                                             157ml/hr for 30 minutes                                             144ml/hr for remainder of infusion

## 2011-03-22 IMAGING — CR DG CHEST 1V PORT
1 series · 1 of 1 positions shown · non-contrast
Comparison: CT chest 07/07/2009 and chest x-ray 07/06/2009

CLINICAL DATA: Port-A-Cath placement.

PORTABLE CHEST - 1 VIEW

[view not recorded]
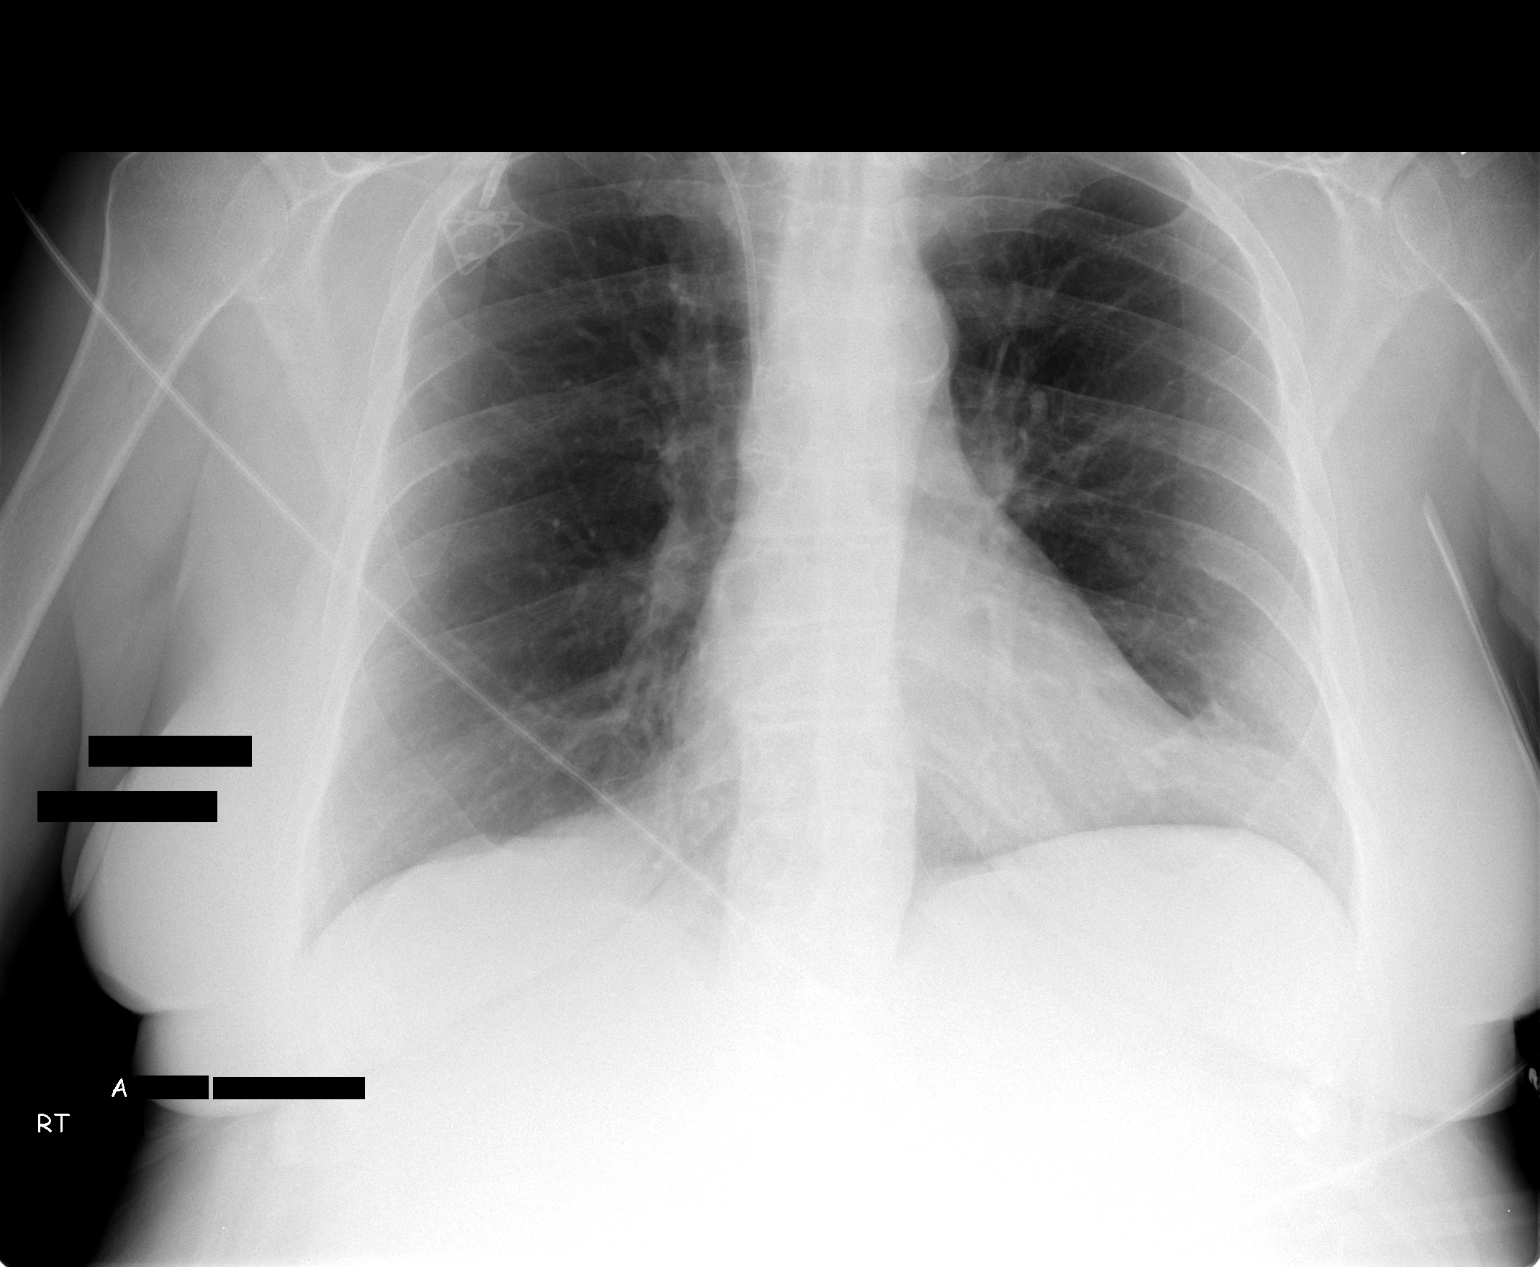

[1 of 1 positions shown; findings below may reference images not displayed]

FINDINGS: Right subclavian Port-A-Cath tip projects over the SVC.
Heart size normal.  Thoracic aorta is calcified.  Minimal scarring
in the lingula and right middle lobe.  Lungs are otherwise clear.
No pleural fluid.  No pneumothorax.
IMPRESSION: Right subclavian Port-A-Cath insertion without complicating
feature.

## 2011-03-25 ENCOUNTER — Encounter (HOSPITAL_BASED_OUTPATIENT_CLINIC_OR_DEPARTMENT_OTHER): Payer: BC Managed Care – PPO

## 2011-03-25 VITALS — BP 108/71 | HR 74 | Temp 98.5°F | Wt 166.8 lb

## 2011-03-25 DIAGNOSIS — Z5112 Encounter for antineoplastic immunotherapy: Secondary | ICD-10-CM

## 2011-03-25 DIAGNOSIS — C8589 Other specified types of non-Hodgkin lymphoma, extranodal and solid organ sites: Secondary | ICD-10-CM

## 2011-03-25 DIAGNOSIS — D801 Nonfamilial hypogammaglobulinemia: Secondary | ICD-10-CM

## 2011-03-25 MED ORDER — SODIUM CHLORIDE 0.9 % IV SOLN
Freq: Once | INTRAVENOUS | Status: AC
  Administered 2011-03-25: 500 mL via INTRAVENOUS

## 2011-03-25 MED ORDER — SODIUM CHLORIDE 0.9 % IJ SOLN
10.0000 mL | INTRAMUSCULAR | Status: DC | PRN
  Administered 2011-03-25: 10 mL
  Filled 2011-03-25: qty 10

## 2011-03-25 MED ORDER — SODIUM CHLORIDE 0.9 % IV SOLN
375.0000 mg/m2 | Freq: Once | INTRAVENOUS | Status: AC
  Administered 2011-03-25: 700 mg via INTRAVENOUS
  Filled 2011-03-25: qty 70

## 2011-03-25 MED ORDER — HEPARIN SOD (PORK) LOCK FLUSH 100 UNIT/ML IV SOLN
500.0000 [IU] | Freq: Once | INTRAVENOUS | Status: DC | PRN
  Filled 2011-03-25: qty 5

## 2011-03-25 NOTE — Progress Notes (Signed)
[name] reports taking tylenol and [facility]ad[facility]  Tolerated infusion well and without incident; verbalizes understanding for follow-up.  No distress noted at time of discharge and patient was discharged home with herself.

## 2011-03-28 ENCOUNTER — Telehealth: Payer: Self-pay | Admitting: Critical Care Medicine

## 2011-03-28 MED ORDER — ALBUTEROL SULFATE HFA 108 (90 BASE) MCG/ACT IN AERS: 2.0000 | INHALATION_SPRAY | Freq: Four times a day (QID) | RESPIRATORY_TRACT | Status: DC | PRN

## 2011-03-28 NOTE — Telephone Encounter (Signed)
I spoke with pt and she is requesting a refill on her albuterol inhaler sent to [facility]. I advised will send rx and nothing further was needed.

## 2011-03-31 ENCOUNTER — Encounter (HOSPITAL_BASED_OUTPATIENT_CLINIC_OR_DEPARTMENT_OTHER): Payer: BC Managed Care – PPO

## 2011-03-31 VITALS — BP 112/69 | HR 72 | Temp 98.3°F

## 2011-03-31 DIAGNOSIS — D801 Nonfamilial hypogammaglobulinemia: Secondary | ICD-10-CM

## 2011-03-31 DIAGNOSIS — Z5112 Encounter for antineoplastic immunotherapy: Secondary | ICD-10-CM

## 2011-03-31 DIAGNOSIS — C8589 Other specified types of non-Hodgkin lymphoma, extranodal and solid organ sites: Secondary | ICD-10-CM

## 2011-03-31 IMAGING — CR DG CHEST 1V PORT
1 series · 1 of 1 positions shown · non-contrast
Comparison: Portable exam 8407 hours compared to 07/30/2009

CLINICAL DATA: Fever, chills, weakness, post chemotherapy for
lymphoma

PORTABLE CHEST - 1 VIEW

[view not recorded]
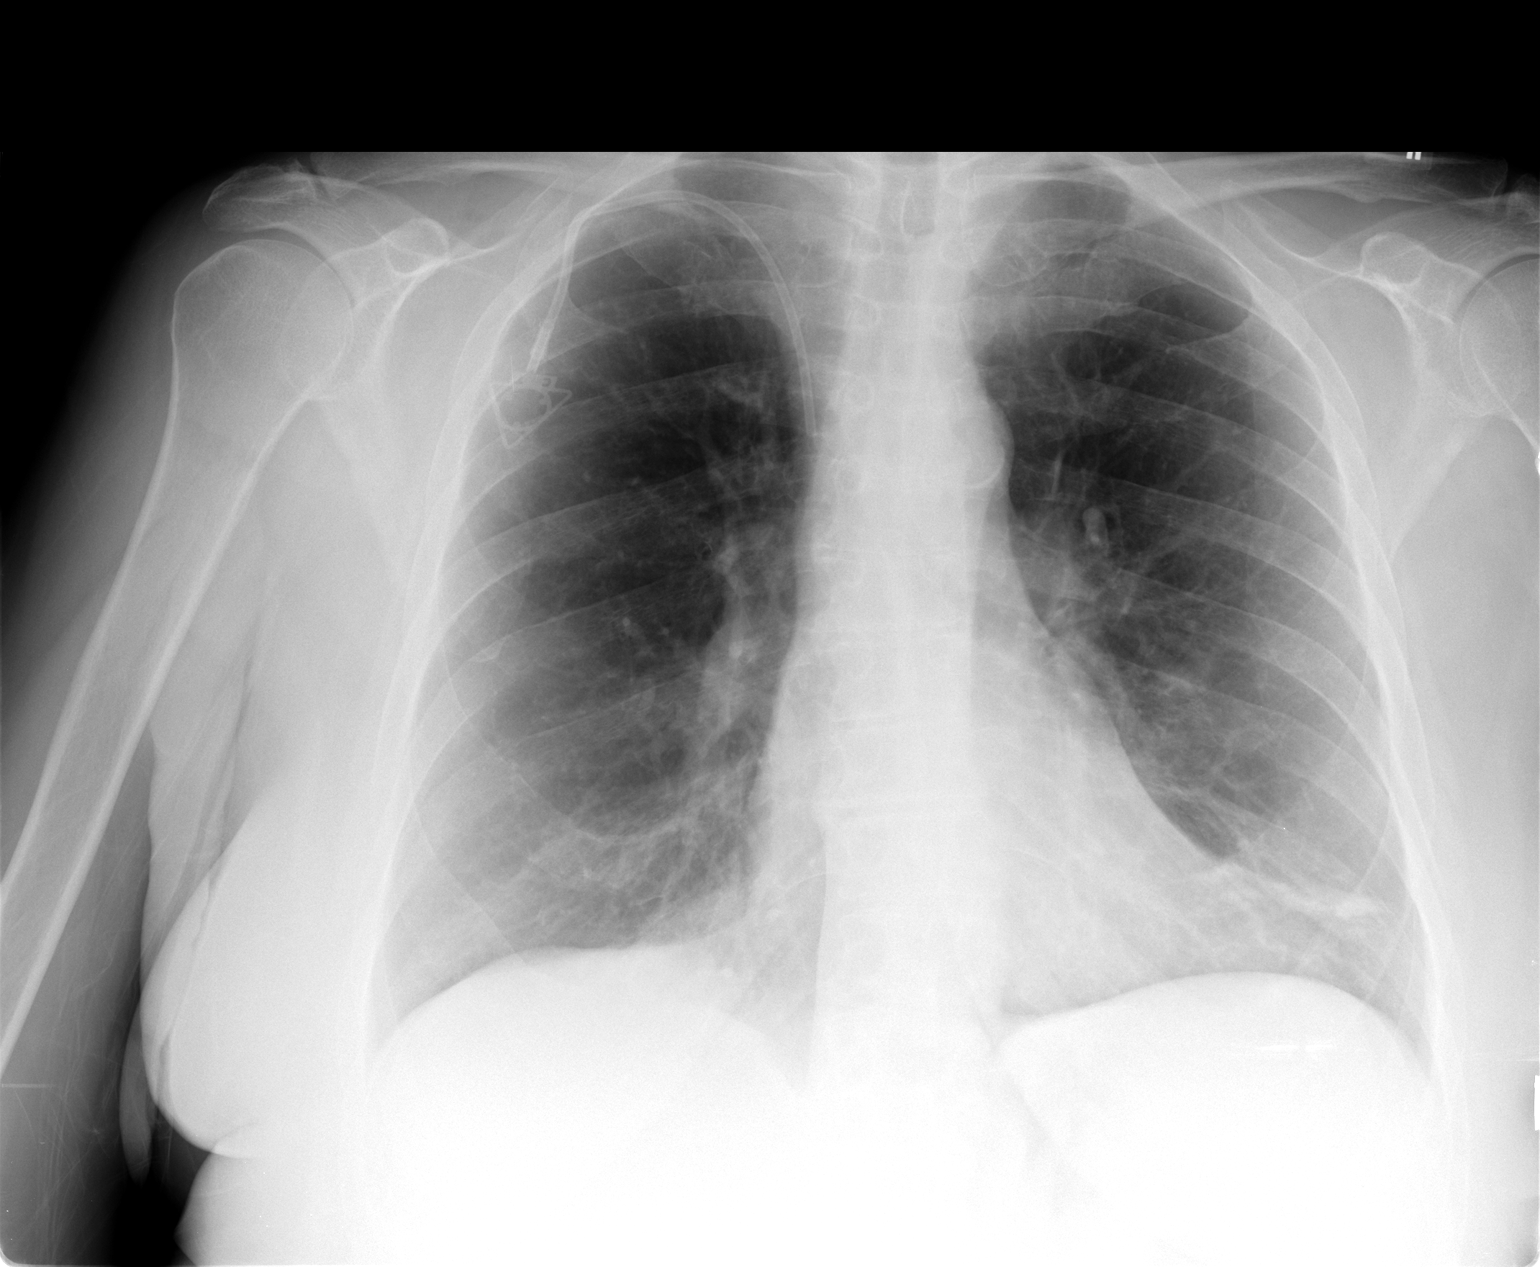

[1 of 1 positions shown; findings below may reference images not displayed]

FINDINGS: Right subclavian Port-A-Cath, tip SVC.
Normal heart size, mediastinal contours, and pulmonary vascularity.
Atherosclerotic ossification aortic arch.
Subsegmental atelectasis lingula.
Lungs otherwise clear.
Question underlying emphysematous changes.
No pleural effusion or pneumothorax.
IMPRESSION: Mild subsegmental atelectasis, lingula.

## 2011-03-31 MED ORDER — SODIUM CHLORIDE 0.9 % IJ SOLN
10.0000 mL | INTRAMUSCULAR | Status: DC | PRN
  Filled 2011-03-31: qty 10

## 2011-03-31 MED ORDER — SODIUM CHLORIDE 0.9 % IV SOLN
Freq: Once | INTRAVENOUS | Status: AC
  Administered 2011-03-31: 10:00:00 via INTRAVENOUS

## 2011-03-31 MED ORDER — SODIUM CHLORIDE 0.9 % IV SOLN
375.0000 mg/m2 | Freq: Once | INTRAVENOUS | Status: AC
  Administered 2011-03-31: 700 mg via INTRAVENOUS
  Filled 2011-03-31: qty 70

## 2011-03-31 NOTE — Progress Notes (Signed)
[name] reports taking tylenol and benadryl prior to arrival today.  Patient tolerated infusion well and without incident; verbalizes understanding for follow-up.  No distress noted at time of discharge and patient was discharged home by herself.

## 2011-04-01 ENCOUNTER — Inpatient Hospital Stay (HOSPITAL_COMMUNITY): Payer: BC Managed Care – PPO

## 2011-04-04 IMAGING — CT CT MAXILLOFACIAL W/O CM
3 series · 16 of 47 positions shown, 19 images · non-contrast
Comparison: 02/12/2008

CLINICAL DATA: Chronic sinusitis, history of bilateral sinus
surgery, lymphoma, neutropenia, fever

CT MAXILLOFACIAL WITHOUT CONTRAST
TECHNIQUE: Multidetector CT imaging of the maxillofacial
structures was performed. Multiplanar CT image reconstructions were
also generated. Right side of head marked with a BB.

[Series 2: max soft 2.0 h31s · axial · 0.40mm/px · z∈[+16,+166]mm · 10 of 116 slices shown, 13 images]
[im 8/116  brain]
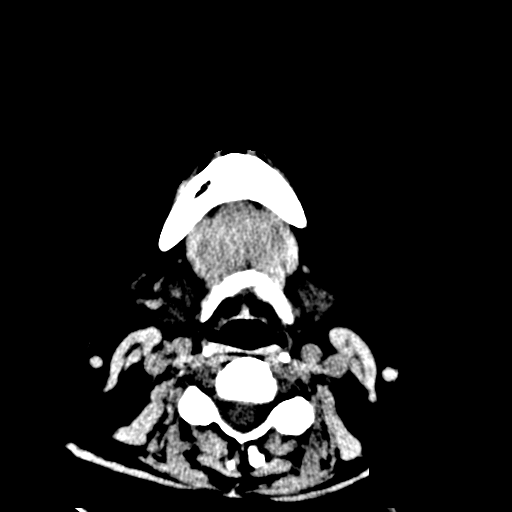
[im 8/116  bone]
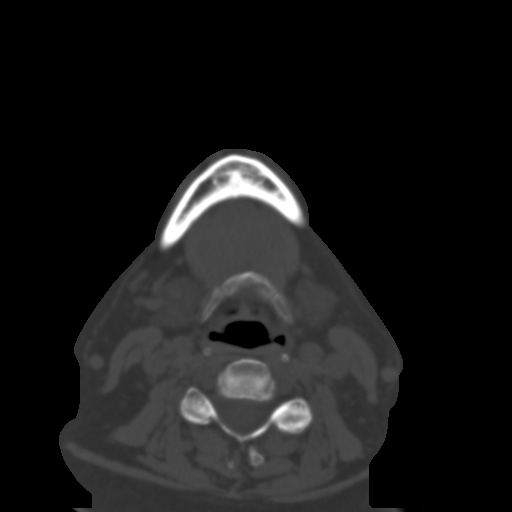
[im 20/116  bone]
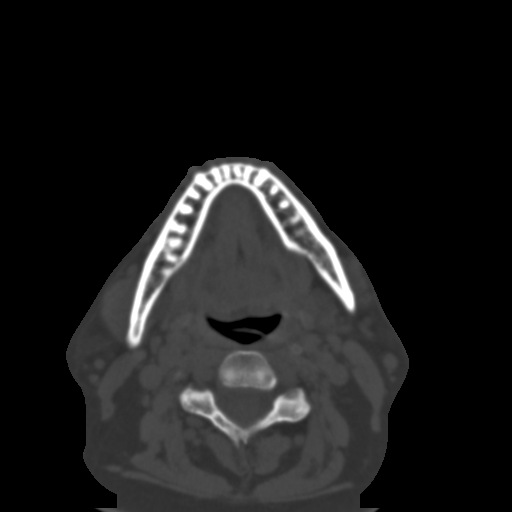
[im 32/116  bone]
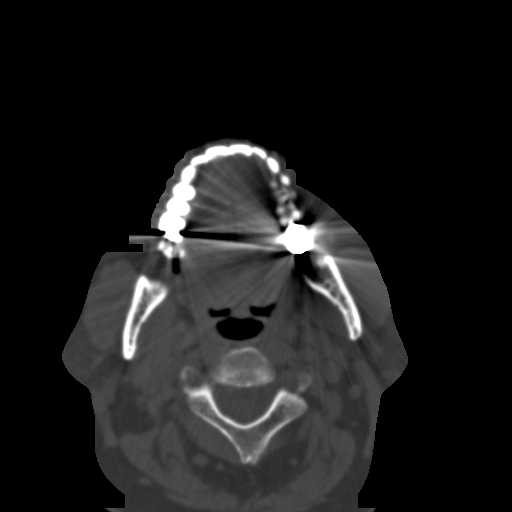
[im 40/116  bone]
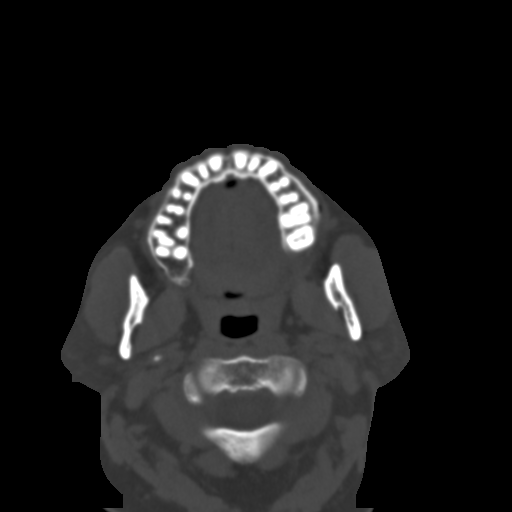
[im 52/116  brain]
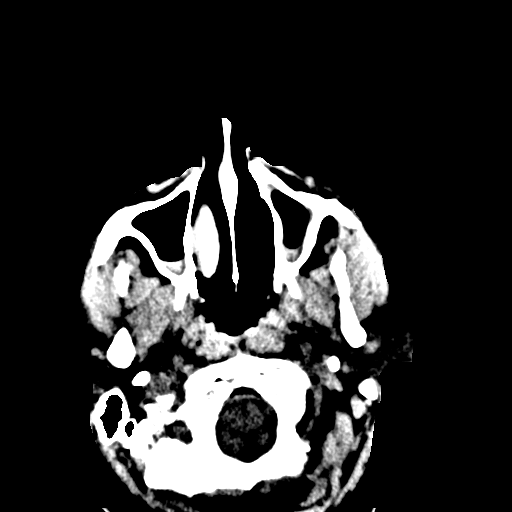
[im 52/116  bone]
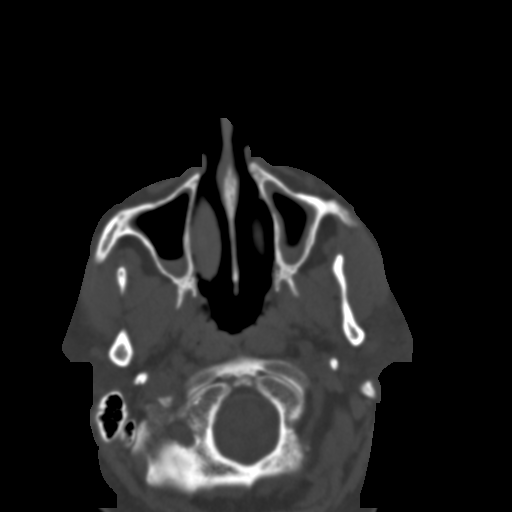
[im 64/116  bone]
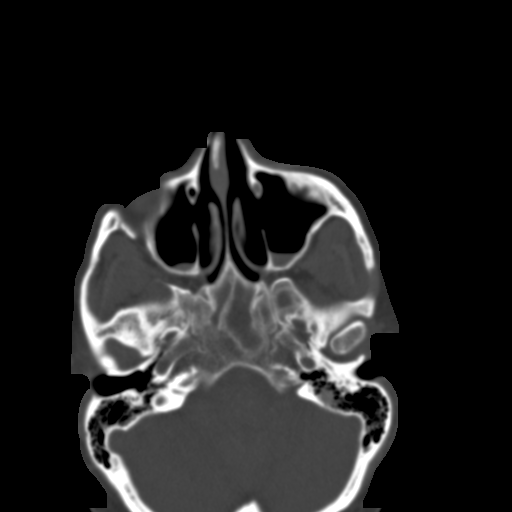
[im 76/116  bone]
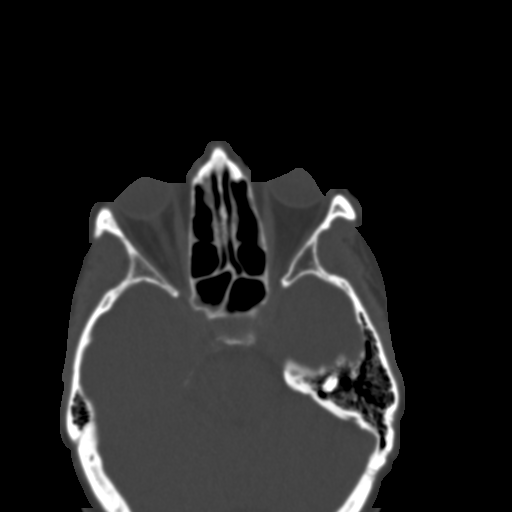
[im 88/116  bone]
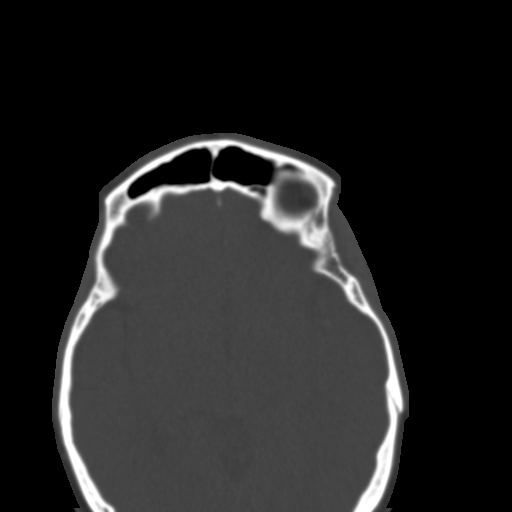
[im 96/116  brain]
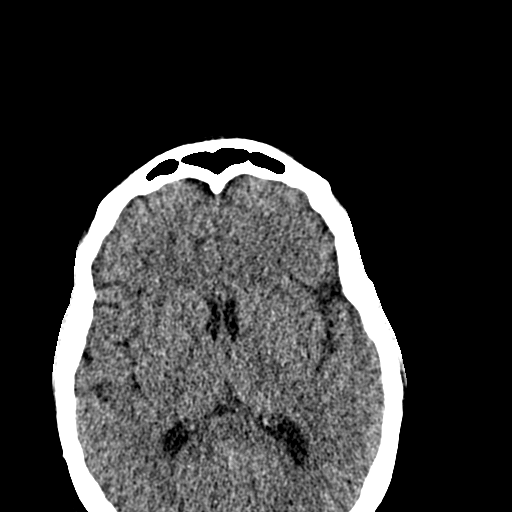
[im 96/116  bone]
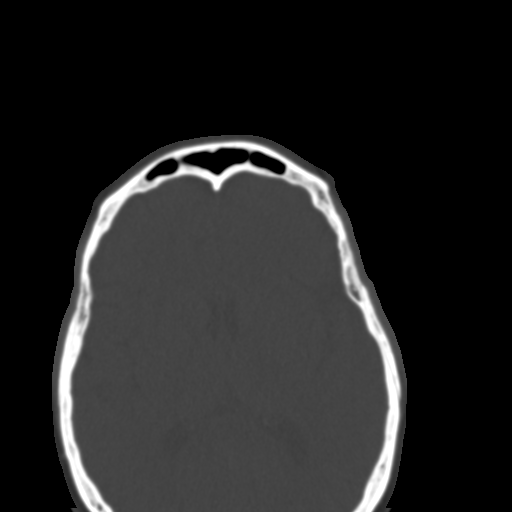
[im 108/116  bone]
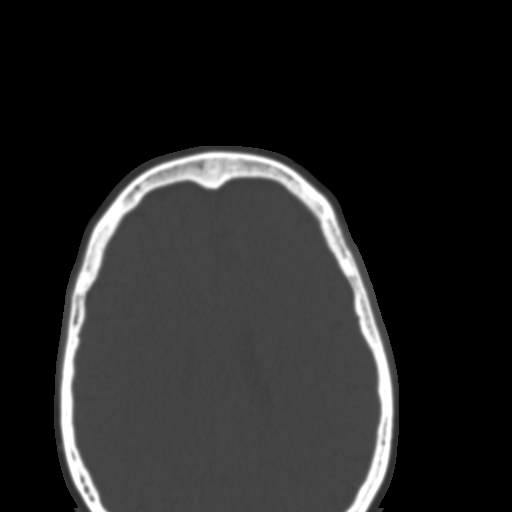

[Series 4: max st coronal · coronal · 0.31mm/px · 3 of 70 slices shown]
[im 24/70  bone]
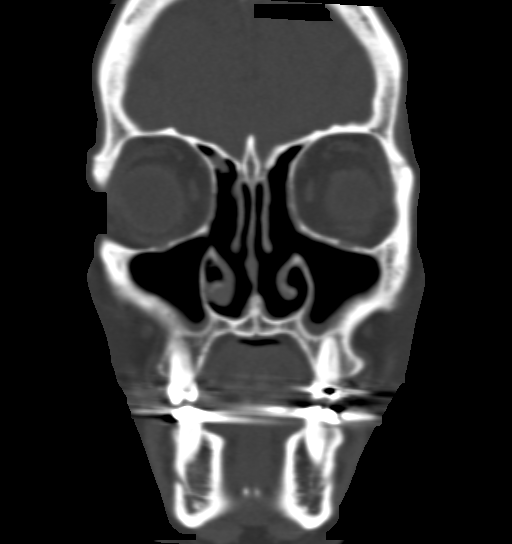
[im 31/70  bone]
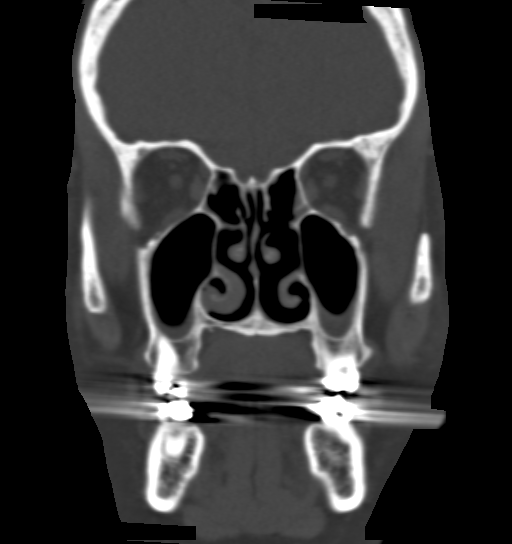
[im 39/70  bone]
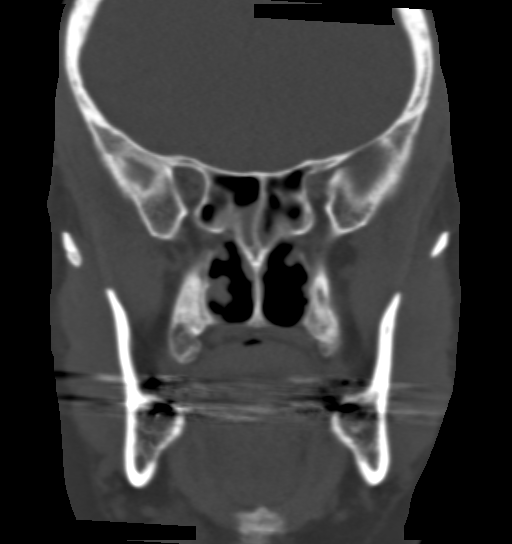

[Series 5: max st 2.0 · sagittal · 0.32mm/px · 3 of 74 slices shown]
[im 25/74  bone]
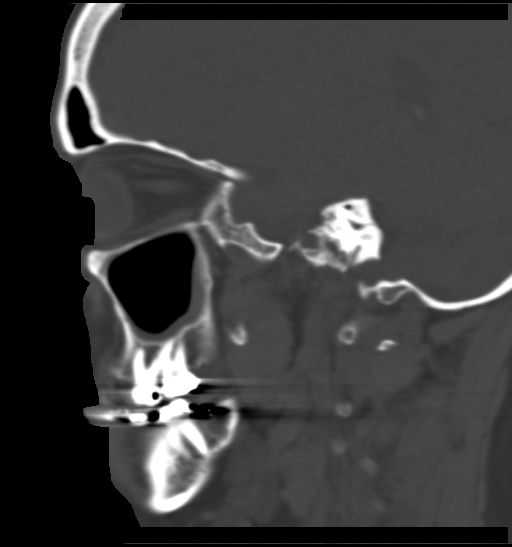
[im 37/74  bone]
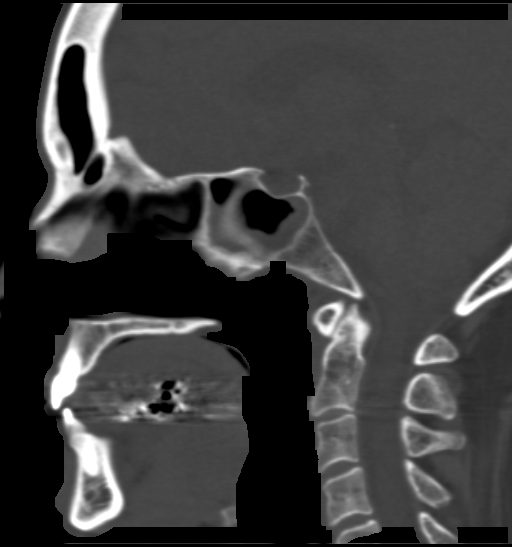
[im 49/74  bone]
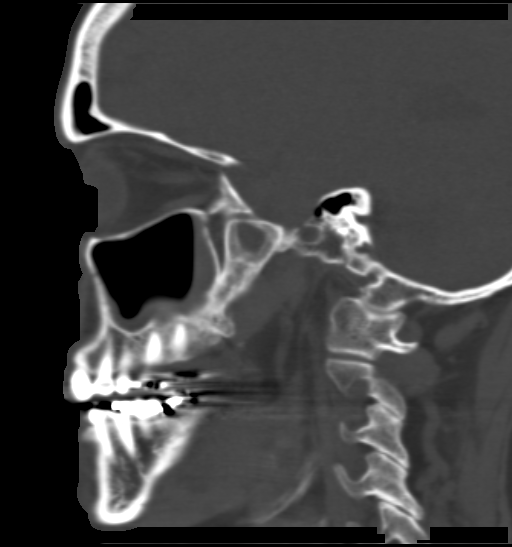

[16 of 47 positions shown; findings below may reference images not displayed]

FINDINGS: Beam hardening artifacts from dental fillings.
Visualized intracranial structures grossly unremarkable for
technique.
Tiny fluid levels in sphenoid and bilateral maxillary sinuses.
Scattered mucosal thickening sphenoid sinus and a few ethmoid air
cells.
Prior surgical decompressions of medial walls of bilateral
maxillary sinuses.
Nasal septal deviation to the right.
Question bony demineralization.
Remaining paranasal sinuses clear.
Mastoid air cells and middle ear cavities clear bilaterally.
No acute bony abnormalities.
IMPRESSION: Mucosal thickening sphenoid sinus with associated sphenoid and
minimal bilateral maxillary sinus air-fluid levels.
Prior bilateral maxillary sinus surgery.

## 2011-04-05 ENCOUNTER — Ambulatory Visit: Payer: BC Managed Care – PPO | Admitting: Family Medicine

## 2011-04-26 IMAGING — MR MR HEAD WO/W CM
8 of 11 series · 28 of 48 positions shown · IV contrast (15ml Multihance)
Comparison: None

CLINICAL DATA: Non-Hodgkins lymphoma.

MRI HEAD WITHOUT AND WITH CONTRAST
TECHNIQUE: Multiplanar, multiecho pulse sequences of the brain and
surrounding structures were obtained according to standard protocol
without and with intravenous contrast
Contrast: 15 ml Multihance IV

[Series 3: DWI · axial · 5.0mm · 0.72mm/px · z∈[-21,+129]mm · 3 of 24 slices shown (1 of 2)]
[im 1/24]
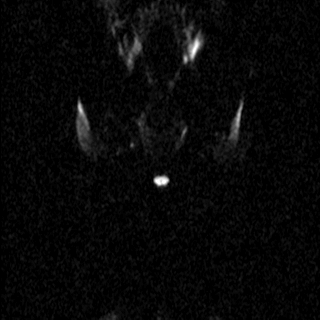
[im 12/24]
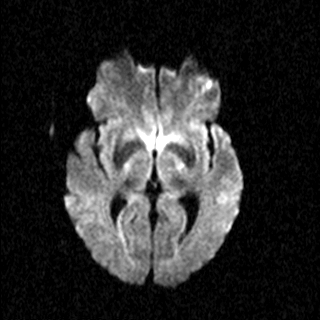
[im 24/24]
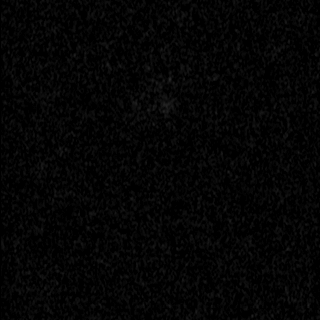

[Series 4: DWI · axial · 5.0mm · 0.72mm/px · z∈[-21,+122]mm · 3 of 23 slices shown (2 of 2)]
[im 1/23]
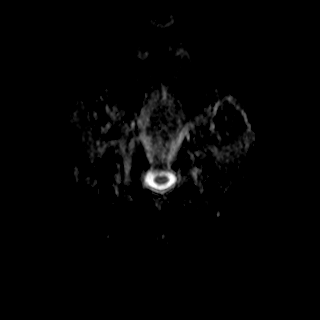
[im 12/23]
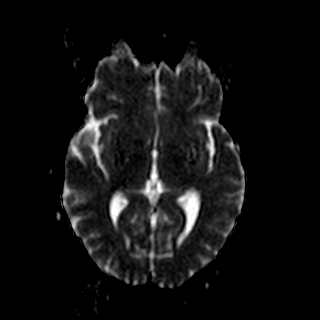
[im 23/23]
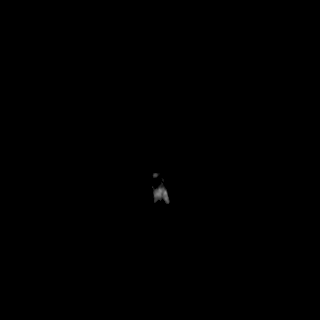

[Series 5: T2 · axial · 5.0mm · 0.57mm/px · z∈[-13,+131]mm · 3 of 25 slices shown (1 of 2)]
[im 1/25]
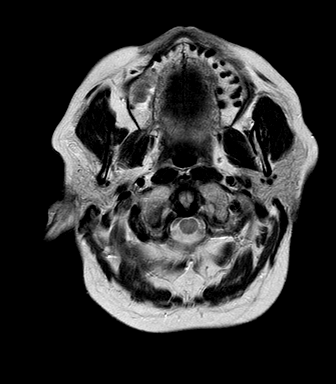
[im 13/25]
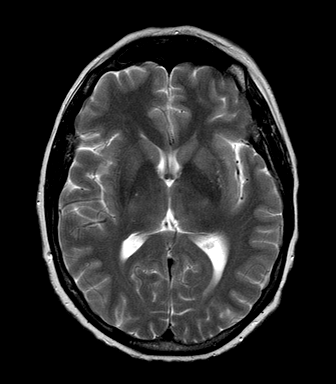
[im 25/25]
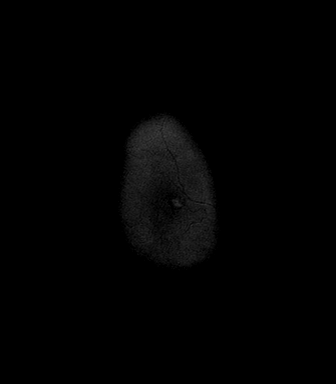

[Series 8: T2 · axial · 5.0mm · 0.51mm/px · z∈[-12,+60]mm · 2 of 25 slices shown (2 of 2)]
[im 1/25]
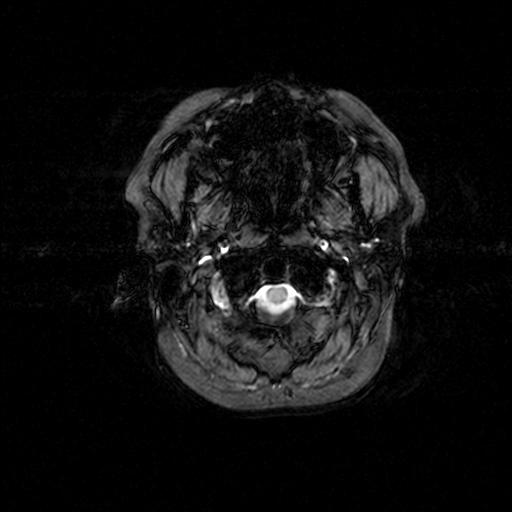
[im 13/25]
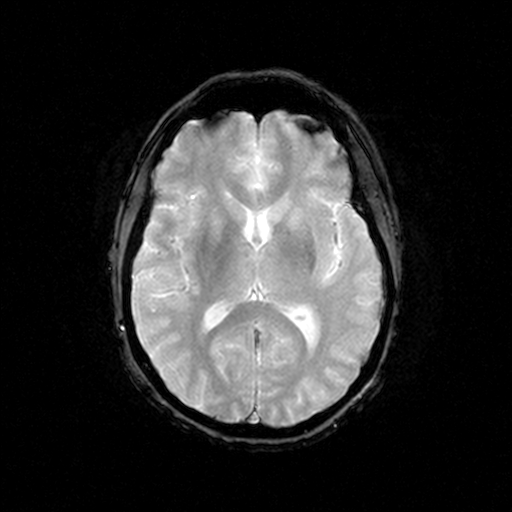

[Series 9: FLAIR · axial · 5.0mm · 0.48mm/px · z∈[-13,+131]mm · 3 of 25 slices shown]
[im 1/25]
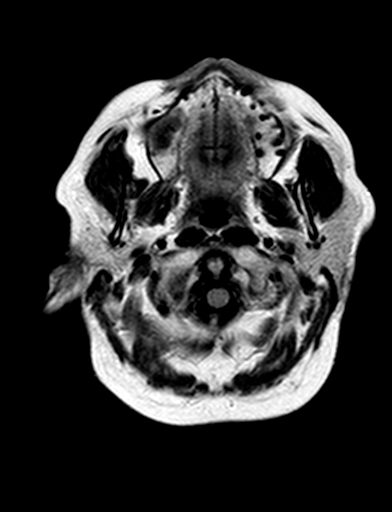
[im 13/25]
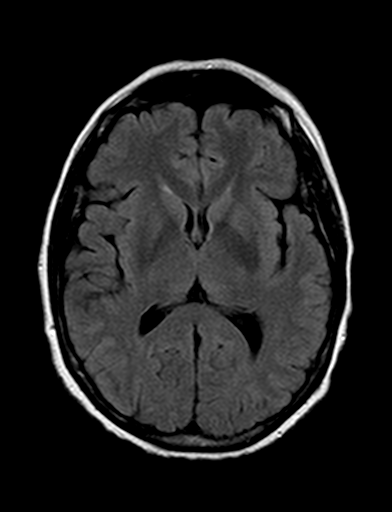
[im 25/25]
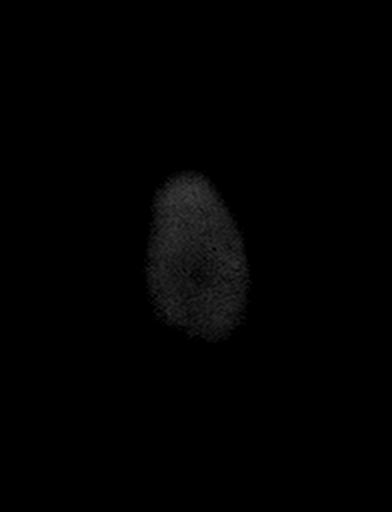

[Series 11: T1 post-contrast · axial · 2.0mm · 0.57mm/px · z∈[-20,+138]mm · 8 of 80 slices shown (1 of 3)]
[im 1/80]
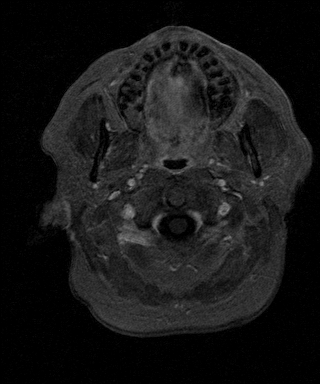
[im 9/80]
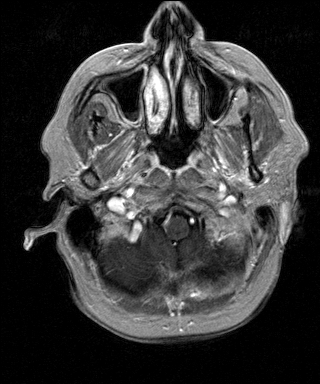
[im 27/80]
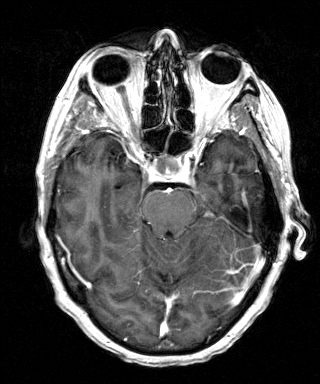
[im 36/80]
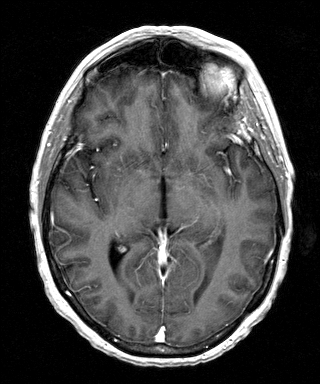
[im 44/80]
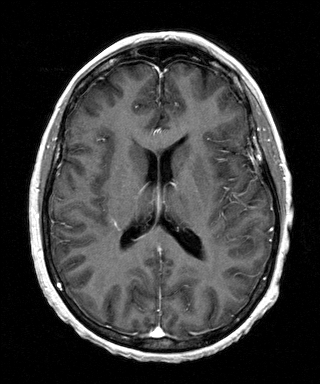
[im 53/80]
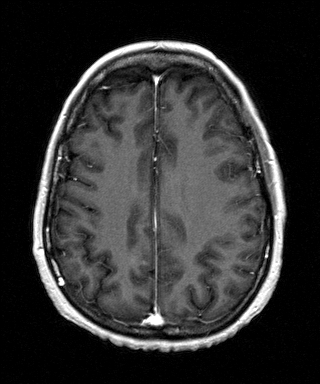
[im 71/80]
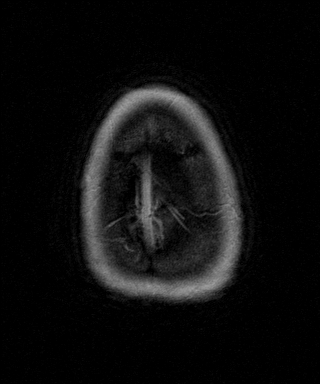
[im 80/80]
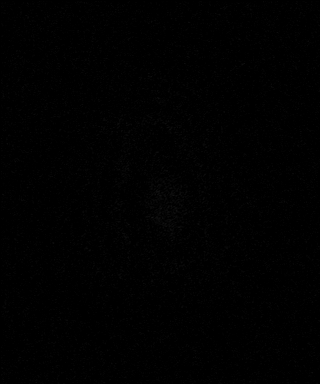

[Series 12: T1 post-contrast · coronal · 5.0mm · 0.43mm/px · 3 of 24 slices shown (2 of 3)]
[im 1/24]
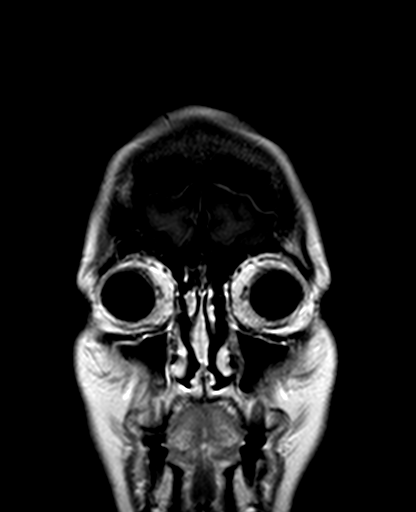
[im 12/24]
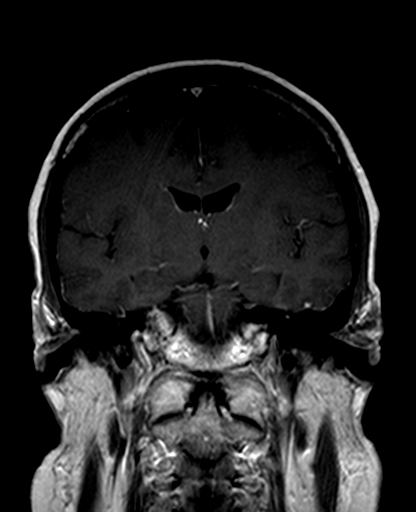
[im 24/24]
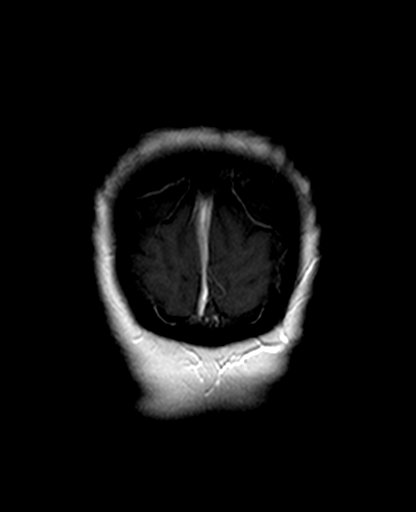

[Series 13: T1 post-contrast · sagittal · 5.0mm · 0.38mm/px · 3 of 20 slices shown (3 of 3)]
[im 1/20]
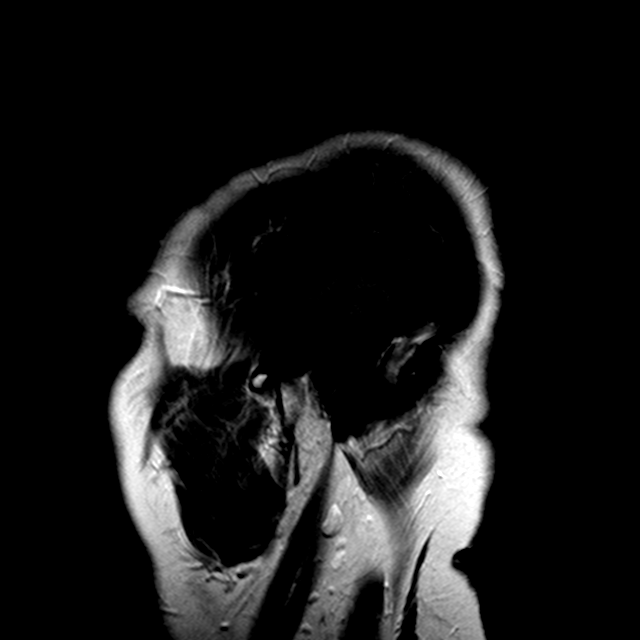
[im 10/20]
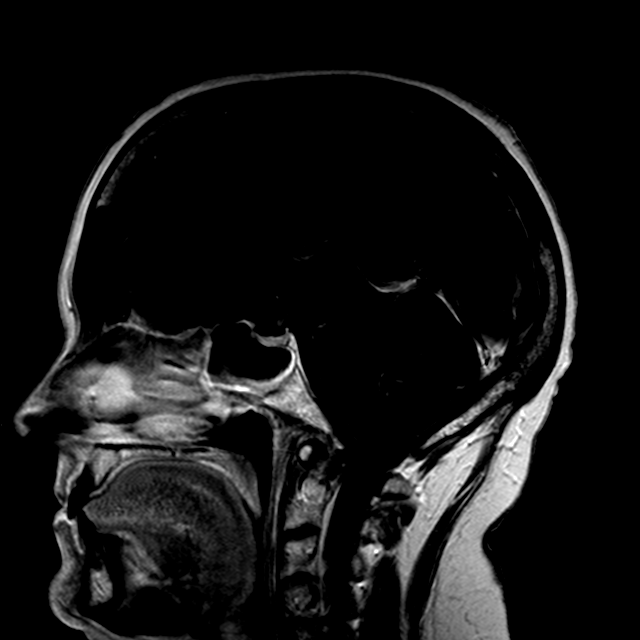
[im 20/20]
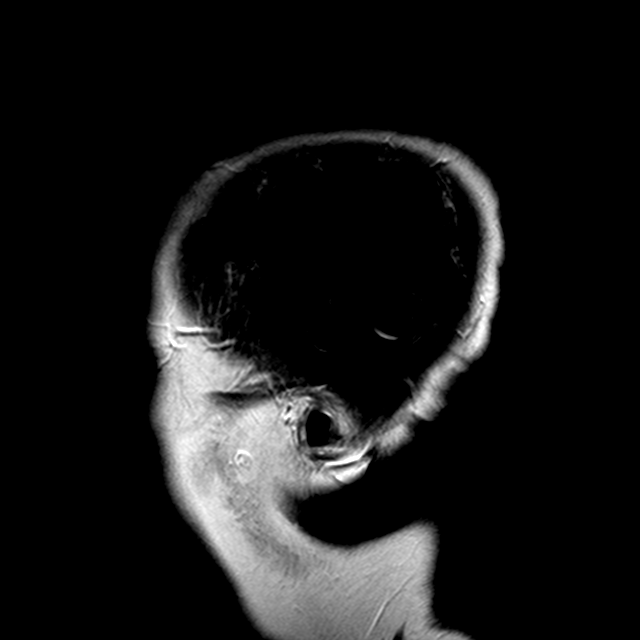

[28 of 48 positions shown; findings below may reference images not displayed]

FINDINGS: Ventricle size is normal.  Negative for acute or chronic
infarction.  Negative for mass lesion.  No fluid collection or
hemorrhage is identified.

Following contrast infusion, the enhancement pattern is normal.  No
mass lesion or leptomeningeal enhancement is identified.

There is sinusitis with air-fluid level in the right maxillary
sinus.  There is also a small air-fluid level on the left maxillary
sinus and the sphenoid sinus, with associated mucosal edema.
IMPRESSION: Normal appearing brain.

Sinusitis.

## 2011-04-28 ENCOUNTER — Encounter (HOSPITAL_COMMUNITY): Payer: BC Managed Care – PPO | Attending: Oncology

## 2011-04-28 ENCOUNTER — Ambulatory Visit (HOSPITAL_COMMUNITY)
Admission: RE | Admit: 2011-04-28 | Discharge: 2011-04-28 | Disposition: A | Discharge status: Home / Self Care | Payer: BC Managed Care – PPO | Source: Ambulatory Visit | Attending: Oncology | Admitting: Oncology

## 2011-04-28 ENCOUNTER — Telehealth (HOSPITAL_COMMUNITY): Payer: Self-pay | Admitting: *Deleted

## 2011-04-28 ENCOUNTER — Other Ambulatory Visit (HOSPITAL_COMMUNITY): Payer: Self-pay | Admitting: Oncology

## 2011-04-28 DIAGNOSIS — C8589 Other specified types of non-Hodgkin lymphoma, extranodal and solid organ sites: Secondary | ICD-10-CM | POA: Insufficient documentation

## 2011-04-28 DIAGNOSIS — R05 Cough: Secondary | ICD-10-CM | POA: Insufficient documentation

## 2011-04-28 DIAGNOSIS — R059 Cough, unspecified: Secondary | ICD-10-CM | POA: Insufficient documentation

## 2011-04-28 LAB — COMPREHENSIVE METABOLIC PANEL
AST: 14 U/L (ref 0–37)
Albumin: 3.4 g/dL — ABNORMAL LOW (ref 3.5–5.2)
Alkaline Phosphatase: 96 U/L (ref 39–117)
BUN: 11 mg/dL (ref 6–23)
Chloride: 101 mEq/L (ref 96–112)
Creatinine, Ser: 1 mg/dL (ref 0.50–1.10)
Potassium: 4 mEq/L (ref 3.5–5.1)
Total Bilirubin: 0.3 mg/dL (ref 0.3–1.2)
Total Protein: 6.6 g/dL (ref 6.0–8.3)

## 2011-04-28 LAB — CBC
Hemoglobin: 11.3 g/dL — ABNORMAL LOW (ref 12.0–15.0)
MCH: 30 pg (ref 26.0–34.0)
MCHC: 32.1 g/dL (ref 30.0–36.0)
Platelets: 209 10*3/uL (ref 150–400)
RDW: 15.4 % (ref 11.5–15.5)

## 2011-04-28 LAB — DIFFERENTIAL
Basophils Absolute: 0 10*3/uL (ref 0.0–0.1)
Basophils Relative: 0 % (ref 0–1)
Eosinophils Absolute: 0.2 10*3/uL (ref 0.0–0.7)
Monocytes Relative: 4 % (ref 3–12)
Neutro Abs: 12.8 10*3/uL — ABNORMAL HIGH (ref 1.7–7.7)
Neutrophils Relative %: 91 % — ABNORMAL HIGH (ref 43–77)

## 2011-04-28 LAB — LACTATE DEHYDROGENASE: LDH: 181 U/L (ref 94–250)

## 2011-04-28 NOTE — Telephone Encounter (Signed)
Left message for pt as below. Instruct to call clinic if any questions and to call back Tue or Wed if no improvements.

## 2011-04-28 NOTE — Telephone Encounter (Signed)
Message copied by Dennie Maizes on Thu Apr 28, 2011  1:32 PM ------      Message from: Ellouise Newer III      Created: Thu Apr 28, 2011  1:17 PM       Chest x-ray is good.             Call us on Next Wednesday if not better.            Encouraged Advil/Ibuprofen/Aleve/Tylenol for discomfort.

## 2011-05-06 IMAGING — CR DG CHEST 2V
2 series · 2 of 2 positions shown · non-contrast
Comparison: 08/08/2009

CLINICAL DATA: Fever, shortness of breath, history malignancy

CHEST - 2 VIEW

[view not recorded (1 of 2)]
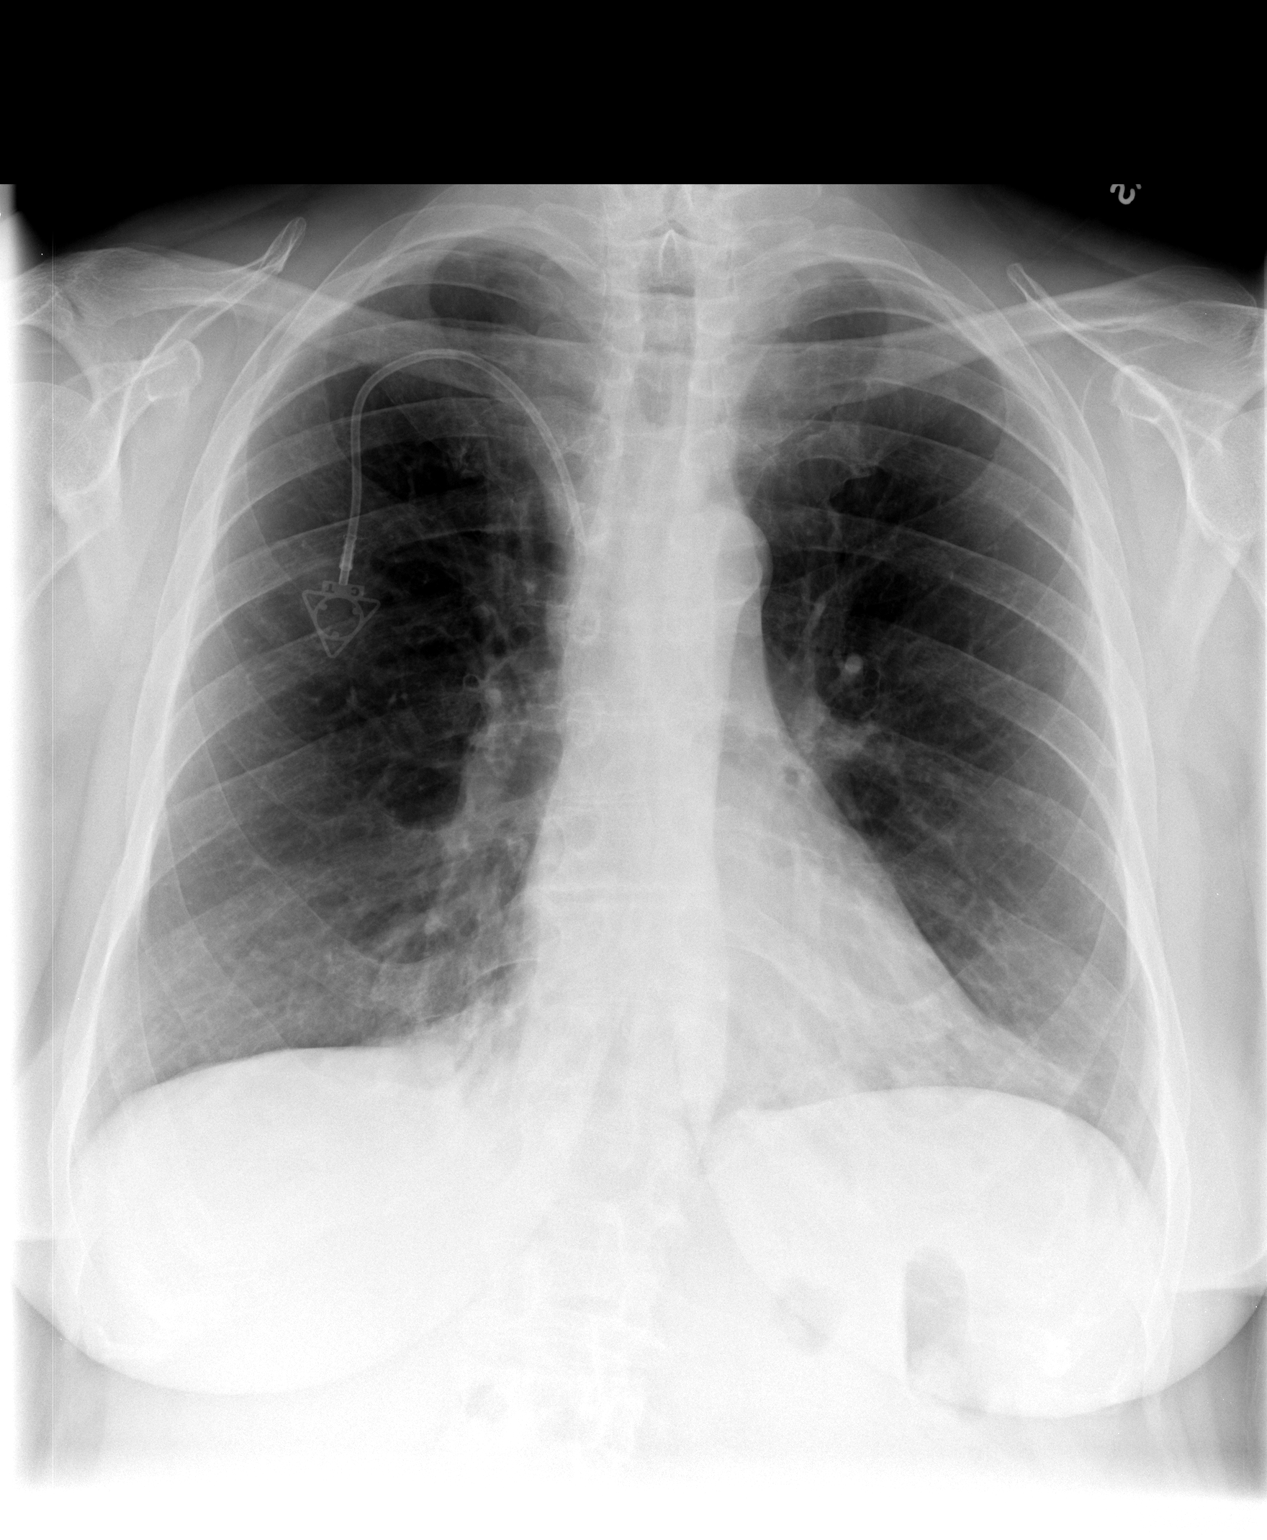

[view not recorded (2 of 2)]
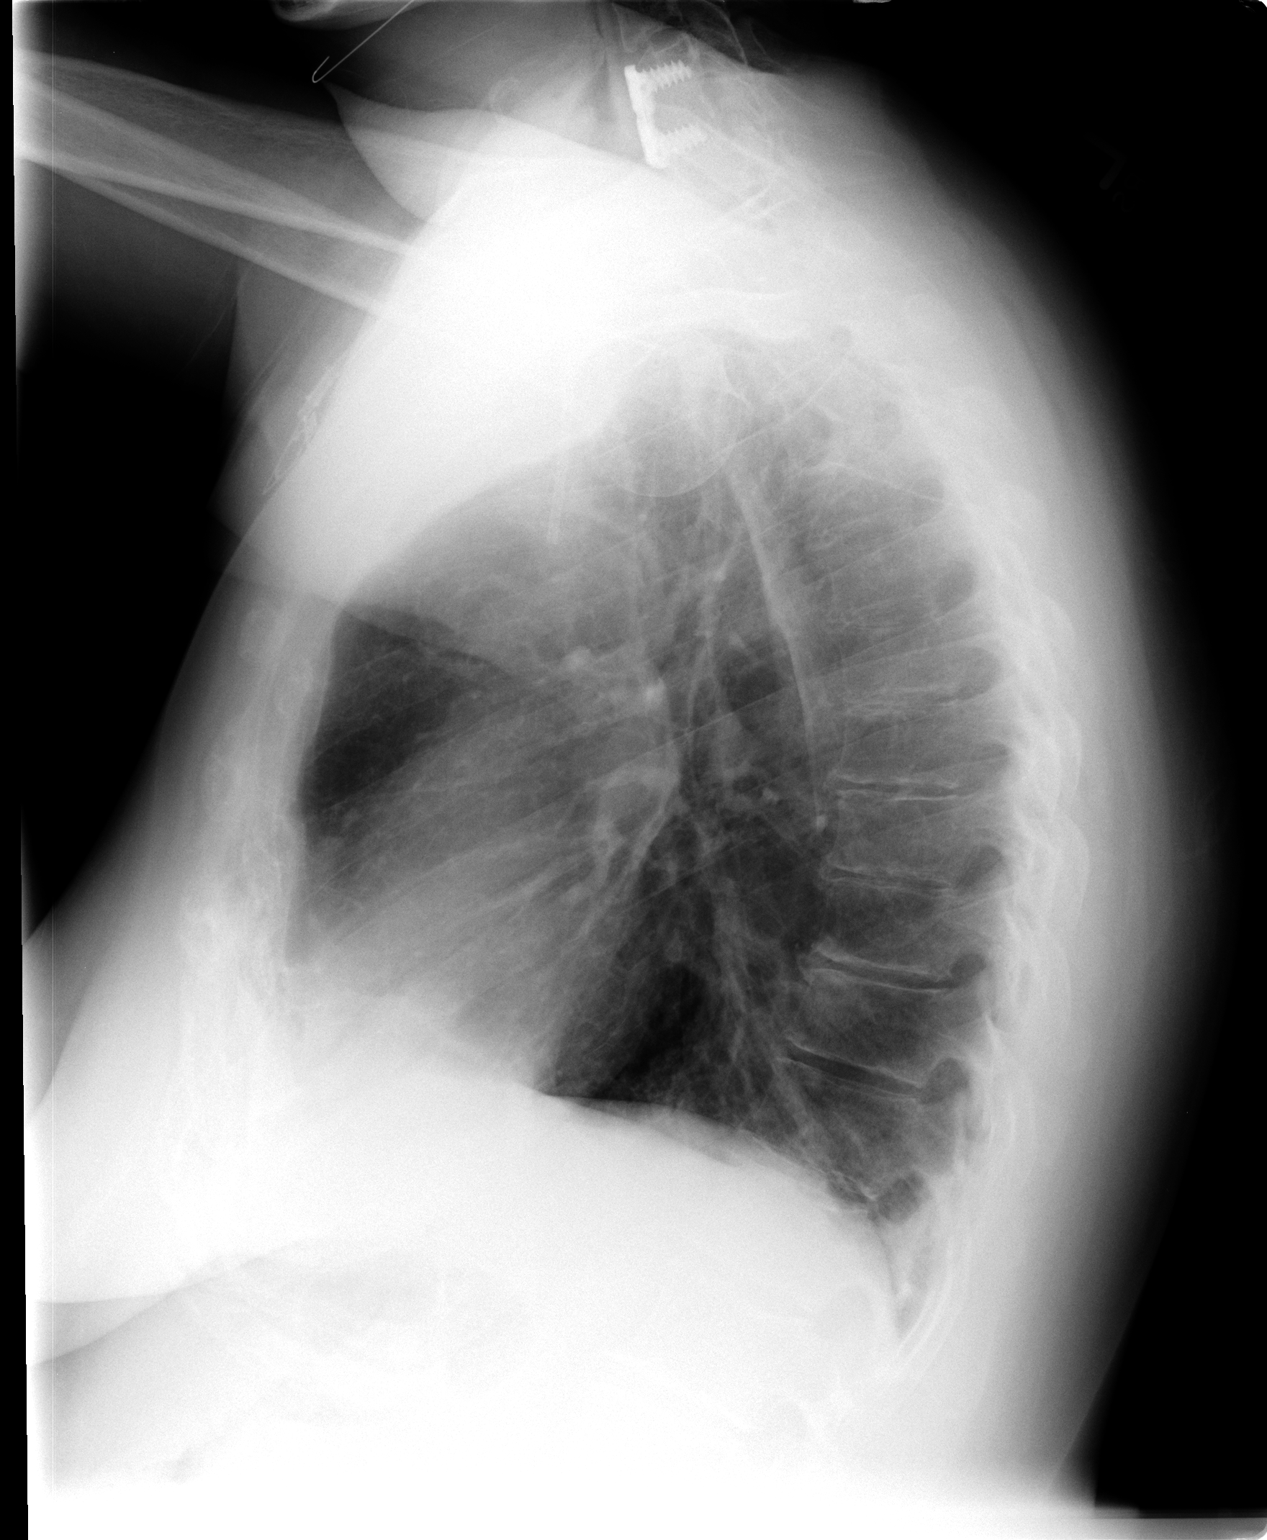

[2 of 2 positions shown; findings below may reference images not displayed]

FINDINGS: Right subclavian Port-A-Cath stable, tip SVC.
Stable heart size, mediastinal contours, and pulmonary vascularity.
Atherosclerotic calcifications aorta.
Chronic peribronchial thickening.
Suspect mild medial right lower lobe infiltrate, new since previous
exams.
Remaining lungs clear.
Bones unremarkable.
IMPRESSION: Suspect medial right lower lobe infiltrate.

## 2011-05-06 NOTE — Progress Notes (Signed)
Labs drawn

## 2011-05-09 ENCOUNTER — Telehealth: Payer: Self-pay | Admitting: Critical Care Medicine

## 2011-05-09 ENCOUNTER — Other Ambulatory Visit (HOSPITAL_COMMUNITY): Payer: Self-pay | Admitting: Oncology

## 2011-05-09 ENCOUNTER — Encounter (HOSPITAL_COMMUNITY): Payer: BC Managed Care – PPO | Attending: Oncology | Admitting: Oncology

## 2011-05-09 ENCOUNTER — Encounter (HOSPITAL_COMMUNITY): Payer: Self-pay | Admitting: Oncology

## 2011-05-09 VITALS — BP 104/68 | HR 76 | Temp 98.8°F | Wt 172.6 lb

## 2011-05-09 DIAGNOSIS — C8589 Other specified types of non-Hodgkin lymphoma, extranodal and solid organ sites: Secondary | ICD-10-CM

## 2011-05-09 DIAGNOSIS — D649 Anemia, unspecified: Secondary | ICD-10-CM

## 2011-05-09 DIAGNOSIS — D801 Nonfamilial hypogammaglobulinemia: Secondary | ICD-10-CM | POA: Insufficient documentation

## 2011-05-09 MED ORDER — IMMUNE GLOBULIN (HUMAN) 10 GM/200ML IV SOLN
1.0000 g/kg | INTRAVENOUS | Status: AC
  Administered 2011-05-12: 09:00:00 via INTRAVENOUS
  Filled 2011-05-09 (×2): qty 800

## 2011-05-09 MED ORDER — IMMUNE GLOBULIN (HUMAN) 20 GM/200ML IV SOLN
1.0000 g/kg | INTRAVENOUS | Status: DC
  Filled 2011-05-09 (×2): qty 800

## 2011-05-09 NOTE — Telephone Encounter (Signed)
See if we can get a picc line placed on this patient this week. She will need single lumen

## 2011-05-09 NOTE — Patient Instructions (Signed)
[name]  [id number] 08-04-1955 Dr. Glenford Peers   [facility]  Discharge Instructions  RECOMMENDATIONS MADE BY THE CONSULTANT AND ANY TEST RESULTS WILL BE SENT TO YOUR REFERRING DOCTOR.   EXAM FINDINGS BY MD TODAY AND SIGNS AND SYMPTOMS TO REPORT TO CLINIC OR PRIMARY MD: Exam and discussion per MD.  Need to go and see Dr. Delford Field.  Will schedule you for IVIG tentatively for Thursday and Friday of this week.  Will call you to verify dates.  MEDICATIONS PRESCRIBED: none   INSTRUCTIONS GIVEN AND DISCUSSED: Other :  Report fevers, sweats, etc.  SPECIAL INSTRUCTIONS/FOLLOW-UP: Return to Clinic:  For IVIG and to see MD in 2 months.   I acknowledge that I have been informed and understand all the instructions given to me and received a copy. I do not have any more questions at this time, but understand that I may call the Specialty Clinic at [facility] at [phone] during business hours should I have any further questions or need assistance in obtaining follow-up care.    __________________________________________  _____________  __________ Signature of Patient or Authorized Representative            Date                   Time    __________________________________________ Nurse's Signature

## 2011-05-09 NOTE — Progress Notes (Signed)
[name] presented for labwork. Labs per MD order drawn via Peripheral Line 23 gauge needle inserted in right AC  Good blood return present. Procedure without incident.  Needle removed intact. Patient tolerated procedure well.

## 2011-05-09 NOTE — Telephone Encounter (Signed)
This pt needs to be worked in to me or TP this week asap Needs ABX set up in the home IV.

## 2011-05-09 NOTE — Telephone Encounter (Signed)
Order placed for PICC line and pt is aware. She will keep OV on Wed., 5/8 with Dr. Delford Field.

## 2011-05-09 NOTE — Telephone Encounter (Signed)
Appt has been scheduled for Wed., 5/8 @ 4:30pm. Pt states that if IV abx is needed she wants to have PICC line. There were too many problems the last time this was done.

## 2011-05-09 NOTE — Progress Notes (Signed)
CC:   Kirk Ruths, M.D. Charlcie Cradle Delford Field, MD, FCCP David L. Annalee Genta, M.D. Jaclyn Prime. Greggory Stallion, M.D.  DIAGNOSES: 1. Diffuse large B-cell lymphoma, stage IVB, status post R-CHOP x4     cycles with intrathecal chemotherapy during cycles 3 and 4 with     methotrexate and Solu-Cortef.  Therapy was stopped due to severe     Pseudomonas sepsis after cycles 2 and 4. 2. Chronic Pseudomonas sinusitis, status post surgery by Dr. Osborn Coho in 2012. 3. Superior vena cava syndrome.  The port has been removed during this     hospitalization June 2012. 4. Severe hypogammaglobulinemia. 5. Adult onset asthma x5 years. 6. Hypertension. 7. Gastroesophageal reflux disease. 8. Multifactorial anemia. Keiko has been coughing tremendously for the last 4-5 weeks.  She has not wanted to "bother" [place].  So she did not call Dr. Delford Field, Dr. Regino Schultze, or myself.  She has had fevers to as high as 101 degrees at times, but she is not febrile today.  She has not had shaking chills, but she still blowing green stuff out of her nose when she irrigates her nose with a saline solution and she is coughing incessantly, she states.  She has coughed so much when driving she has almost passed out at the wheel.  Everything goes black for a few seconds, but she has not been in a car wreck, thank goodness.  She is not driving very much now, which I think is very prudent.  She has rales on both lungs 2/3 of the way up left and right chest posteriorly, partly anteriorly, definitely laterally and they do not clear with coughing.  She did have a chest x-ray last week which was felt to be "unremarkable."  That was done on the 25th of April.  She had some chronic changes at the lung bases.  What is seen in the body of the text is she has some interstitial markings diffusely and I think this is from her infection.  LABORATORIES:  Also showing that her sed rate is high, even though her LDH is normal.  I suspect the  sed rate is a reflection of the infection. Her sed rate was 60 in March, 92 now.  White count was 14,100 a week and a half ago with a left shift.  Her BUN and creatinine are normal.  Electrolytes are okay.  She is in no acute distress, but she is coughing, cannot bring up anything for me to culture right now.  She has had Pseudomonas contamination, so I suspect that is the cause.  Her abdomen remains soft, nontender.  Heart shows a regular rhythm, rate right around 76-80, no S3 gallop.  She has no leg edema.  No adenopathy in the cervical, supraclavicular, infraclavicular, axillary, or inguinal areas.  So I have talked to Dr. Danise Mina.  I am going to start IVIG on her since she was already documented below once again within the last couple of months.  He is going to see her and think about what antibiotic he would like to pursue.  We will give her the IVIG on Thursday and Friday this week and 1 way or the other we will see her back in 6-8 weeks.    ______________________________ Ladona Horns. Mariel Sleet, MD ESN/MEDQ  D:  05/09/2011  T:  05/09/2011  Job:  [id number]

## 2011-05-10 ENCOUNTER — Telehealth: Payer: Self-pay | Admitting: Critical Care Medicine

## 2011-05-10 DIAGNOSIS — A498 Other bacterial infections of unspecified site: Secondary | ICD-10-CM

## 2011-05-10 LAB — IGG, IGA, IGM
IgA: 118 mg/dL (ref 69–380)
IgG (Immunoglobin G), Serum: 506 mg/dL — ABNORMAL LOW (ref 690–1700)

## 2011-05-10 NOTE — Telephone Encounter (Signed)
Order was placed incorrectly yesterday. I called IR and asked how to order PICC line placement. I placed order and sent to [facility]. [facility] is aware this needs to be done ASAP. Carron Curie, CMA

## 2011-05-11 ENCOUNTER — Ambulatory Visit (HOSPITAL_COMMUNITY)
Admission: RE | Admit: 2011-05-11 | Discharge: 2011-05-11 | Disposition: A | Discharge status: Home / Self Care | Payer: BC Managed Care – PPO | Source: Ambulatory Visit | Attending: Critical Care Medicine | Admitting: Critical Care Medicine

## 2011-05-11 ENCOUNTER — Encounter: Payer: Self-pay | Admitting: Critical Care Medicine

## 2011-05-11 ENCOUNTER — Ambulatory Visit (INDEPENDENT_AMBULATORY_CARE_PROVIDER_SITE_OTHER): Payer: BC Managed Care – PPO | Admitting: Critical Care Medicine

## 2011-05-11 ENCOUNTER — Other Ambulatory Visit: Payer: Self-pay | Admitting: Critical Care Medicine

## 2011-05-11 VITALS — BP 136/80 | HR 82 | Temp 98.9°F | Ht 62.0 in | Wt 173.8 lb

## 2011-05-11 VITALS — BP 105/61 | HR 66 | Resp 12

## 2011-05-11 DIAGNOSIS — J019 Acute sinusitis, unspecified: Secondary | ICD-10-CM

## 2011-05-11 DIAGNOSIS — C8589 Other specified types of non-Hodgkin lymphoma, extranodal and solid organ sites: Secondary | ICD-10-CM | POA: Insufficient documentation

## 2011-05-11 DIAGNOSIS — A498 Other bacterial infections of unspecified site: Secondary | ICD-10-CM

## 2011-05-11 DIAGNOSIS — J329 Chronic sinusitis, unspecified: Secondary | ICD-10-CM

## 2011-05-11 DIAGNOSIS — B965 Pseudomonas (aeruginosa) (mallei) (pseudomallei) as the cause of diseases classified elsewhere: Secondary | ICD-10-CM | POA: Insufficient documentation

## 2011-05-11 MED ORDER — BENZONATATE 200 MG PO CAPS: 200.0000 mg | ORAL_CAPSULE | Freq: Three times a day (TID) | ORAL | Status: AC | PRN

## 2011-05-11 MED ORDER — PREDNISONE 10 MG PO TABS: ORAL_TABLET | ORAL | Status: DC

## 2011-05-11 MED ORDER — CEFEPIME HCL 1 G IJ SOLR: 1.0000 g | Freq: Two times a day (BID) | INTRAMUSCULAR | Status: DC

## 2011-05-11 MED ORDER — HYDROCOD POLST-CPM POLST ER 10-8 MG PO CP12: 1.0000 | ORAL_CAPSULE | Freq: Two times a day (BID) | ORAL | Status: DC | PRN

## 2011-05-11 NOTE — Assessment & Plan Note (Signed)
Recurrent acute on chronic sinusitis with associated asthma flare Plan Because of long-standing history of Pseudomonas colonization of sinuses and failure of oral antibiotics will proceed with another round of intravenous antibiotics. PICC line has already been placed Begin cefepime 1 g IV twice daily for 7 days Prednisone pulse Cyclic cough protocol with Tussi caps and [employer]

## 2011-05-11 NOTE — Procedures (Signed)
US/fluoroscopic guided right basilic vein SL PICC placement. Length 39 cm. Tip SVC/RA junction. No immediate complications.

## 2011-05-11 NOTE — Progress Notes (Signed)
Subjective:    Patient ID: [name], female    DOB: Apr 23, 1955, 56 y.o.   MRN: [id number]  HPI  56 y.o.WF  Asthma , hypogammaglobulinemia, chronic sinusits d/t pseudomonas    02/14/11 Was given IgG IV per heme/onc.  Now feels better.  No real cough except in early am. Pt sneezing and yellow mucus over the past week.    Now off ABX [facility] further ABX  3/13 No real change from last ov.  Sl yellow mucus in the am.  Still gets gammaglob IV per heme/onc. Pt to receive Crx this week.  Rituxan is the only agent . No real wheeze.  No chest pains.  No excessSaba    Tolerated bactrim well 2/13 and helped sinus disease   PUL ASTHMA HISTORY 05/11/2011 03/16/2011 08/11/2010  Symptoms Throughout the day 0-2 days/week Daily  Nighttime awakenings Often--7/wk 0-2/month 3-4/month  Interference with activity Some limitations No limitations Minor limitations  SABA use Several times/day 0-2 days/wk > 2 days/wk--not > 1 x/day  Exacerbations requiring oral steroids 2 or more / year 0-1 / year 0-1 / year   05/11/2011 Coughing for one month.  Productive in AM of yellow.  Some mixed with blood over past weekend.  No chest pain, just heavy.  Notes nasal drip yellow.  Severe dry hacky cough.    IV IgG to start in 24hrs. Notes fever at home 101 every afternoon, for one month.    Last Chemorx was 3/13.    Past Medical History  Diagnosis Date  . Non Hodgkin's lymphoma   . Allergic rhinitis   . GERD (gastroesophageal reflux disease)   . Asthma   . Chronic sinusitis   . Respiratory failure   . Cavitary lung disease      Family History  Problem Relation Age of Onset  . Emphysema Mother   . Allergies Father   . Asthma Father     as a child  . Leukemia Maternal Grandmother   . Diabetes Brother   . Stroke Mother      History   Social History  . Marital Status: Divorced    Spouse Name: N/A    Number of Children: 3  . Years of Education: N/A   Occupational History  . [employer] Service  Manager    Social History Main Topics  . Smoking status: Never Smoker   . Smokeless tobacco: Never Used  . Alcohol Use: No  . Drug Use: Not on file  . Sexually Active: Not on file   Other Topics Concern  . Not on file   Social History Narrative  . No narrative on file     Allergies  Allergen Reactions  . Meperidine Hcl   . Montelukast Sodium      Outpatient Prescriptions Prior to Visit  Medication Sig Dispense Refill  . acyclovir (ZOVIRAX) 200 MG capsule 2 capsules twice per day       . albuterol (PROVENTIL) (2.5 MG/3ML) 0.083% nebulizer solution Take 2.5 mg by nebulization every 4 (four) hours as needed.  120 mL  6  . albuterol (VENTOLIN HFA) 108 (90 BASE) MCG/ACT inhaler Inhale 2 puffs into the lungs every 6 (six) hours as needed.  1 Inhaler  5  . citalopram (CELEXA) 20 MG tablet Take 20 mg by mouth every morning.        [name] Kitchen EPINEPHrine (EPIPEN IJ) As needed for severe allergic reaction       . furosemide (LASIX) 20 MG tablet Take 1  tablet by mouth as needed.      . Mometasone Furo-Formoterol Fum 200-5 MCG/ACT AERO Inhale 2 Inhalers into the lungs 2 (two) times daily.  1 Inhaler  6  . omeprazole (PRILOSEC) 20 MG capsule Take 1 capsule (20 mg total) by mouth 2 (two) times daily.  60 capsule  6  . potassium chloride 40 MEQ/15ML (20%) LIQD 2 tablespoons twice daily       . simvastatin (ZOCOR) 80 MG tablet Take 80 mg by mouth at bedtime.        [name] Kitchen warfarin (COUMADIN) 10 MG tablet Take by mouth daily. Currently taking 6 mg alternation with 7mg       . zafirlukast (ACCOLATE) 20 MG tablet Take 1 tablet (20 mg total) by mouth 2 (two) times daily.  60 tablet  5  . predniSONE (DELTASONE) 10 MG tablet Take 5 mg by mouth daily.         No facility-administered medications prior to visit.     Review of Systems  Constitutional:   No  weight loss, night sweats,  Fevers, chills, fatigue, lassitude. HEENT:   Notes  headaches,  Difficulty swallowing,  Tooth/dental problems,  Sore throat,                 No sneezing, itching, ear ache, nasal congestion, +++ post nasal drip,   CV:  No chest pain,  Orthopnea, PND, swelling in lower extremities, anasarca, dizziness, palpitations  GI  No heartburn, indigestion, abdominal pain, nausea, vomiting, diarrhea, change in bowel habits, loss of appetite  Resp: Notes  shortness of breath with exertion and  at rest.  Notes  excess mucus, notes  productive cough,  No non-productive cough,  No coughing up of blood.  Notes  change in color of mucus.  No wheezing.  No chest wall deformity  Skin: no rash or lesions.  GU: no dysuria, change in color of urine, no urgency or frequency.  No flank pain.  MS:  No joint pain or swelling.  No decreased range of motion.  No back pain.  Psych:  No change in mood or affect. No depression or anxiety.  No memory loss.     Objective:   Physical Exam  Filed Vitals:   05/11/11 1626  BP: 136/80  Pulse: 82  Temp: 98.9 F (37.2 C)  TempSrc: Oral  Height: 5\' 2"  (1.575 m)  Weight: 173 lb 12.8 oz (78.835 kg)  SpO2: 92%    Gen: Pleasant, well-nourished, in no distress,  normal affect  ENT: No lesions,  mouth clear,  oropharynx clear, +++  postnasal drip, purulence in bilat nares  Neck: No JVD, no TMG, no carotid bruits  Lungs: No use of accessory muscles, no dullness to percussion, exo  wheeze  Cardiovascular: RRR, heart sounds normal, no murmur or gallops, no peripheral edema  Abdomen: soft and NT, no HSM,  BS normal  Musculoskeletal: No deformities, no cyanosis or clubbing  Neuro: alert, non focal  Skin: Warm, no lesions or rashes      Assessment & Plan:   SINUSITIS, CHRONIC NOS Recurrent acute on chronic sinusitis with associated asthma flare Plan Because of long-standing history of Pseudomonas colonization of sinuses and failure of oral antibiotics will proceed with another round of intravenous antibiotics. PICC line has already been placed Begin cefepime 1 g IV twice daily for 7  days Prednisone pulse Cyclic cough protocol with Tussi caps and Tessalon Perles Maintain Dulera     Updated Medication List Outpatient Encounter Prescriptions  as of 05/11/2011  Medication Sig Dispense Refill  . ACCU-CHEK AVIVA PLUS test strip as directed.      [name] Kitchen acyclovir (ZOVIRAX) 200 MG capsule 2 capsules twice per day       . albuterol (PROVENTIL) (2.5 MG/3ML) 0.083% nebulizer solution Take 2.5 mg by nebulization every 4 (four) hours as needed.  120 mL  6  . albuterol (VENTOLIN HFA) 108 (90 BASE) MCG/ACT inhaler Inhale 2 puffs into the lungs every 6 (six) hours as needed.  1 Inhaler  5  . citalopram (CELEXA) 20 MG tablet Take 20 mg by mouth every morning.        [name] Kitchen EPINEPHrine (EPIPEN IJ) As needed for severe allergic reaction       . furosemide (LASIX) 20 MG tablet Take 1 tablet by mouth as needed.      . Lancets (ACCU-CHEK MULTICLIX) lancets as directed.      . Mometasone Furo-Formoterol Fum 200-5 MCG/ACT AERO Inhale 2 Inhalers into the lungs 2 (two) times daily.  1 Inhaler  6  . omeprazole (PRILOSEC) 20 MG capsule Take 1 capsule (20 mg total) by mouth 2 (two) times daily.  60 capsule  6  . potassium chloride 40 MEQ/15ML (20%) LIQD 2 tablespoons twice daily       . predniSONE (DELTASONE) 10 MG tablet Take 4 for four days 3 for four days 2 for four days 1 daily for four days then 1/2 daily and stay  40 tablet  6  . simvastatin (ZOCOR) 80 MG tablet Take 80 mg by mouth at bedtime.        [name] Kitchen warfarin (COUMADIN) 10 MG tablet Take by mouth daily. Currently taking 6 mg alternation with 7mg       . zafirlukast (ACCOLATE) 20 MG tablet Take 1 tablet (20 mg total) by mouth 2 (two) times daily.  60 tablet  5  . DISCONTD: predniSONE (DELTASONE) 10 MG tablet Take 5 mg by mouth daily.        . benzonatate (TESSALON) 200 MG capsule Take 1 capsule (200 mg total) by mouth 3 (three) times daily as needed for cough.  60 capsule  2  . ceFEPIme (MAXIPIME) 1 G injection Inject 1 g into the muscle every 12  (twelve) hours.  14 each  0  . Hydrocod Polst-Chlorphen Polst (TUSSICAPS) 10-8 MG CP12 Take 1 capsule by mouth 2 (two) times daily as needed.  20 each  0   No facility-administered encounter medications on file as of 05/11/2011.

## 2011-05-11 NOTE — Patient Instructions (Addendum)
Prednisone 10mg  Take 4 for four days 3 for four days 2 for four days 1 daily for four days then 1/2 daily Cefepime 1GM IV twice daily in picc line Cyclic cough protocol with tessalon/Tussicaps No other medication changes Return 2 weeks

## 2011-05-12 ENCOUNTER — Encounter (HOSPITAL_BASED_OUTPATIENT_CLINIC_OR_DEPARTMENT_OTHER): Payer: BC Managed Care – PPO

## 2011-05-12 DIAGNOSIS — D801 Nonfamilial hypogammaglobulinemia: Secondary | ICD-10-CM

## 2011-05-12 DIAGNOSIS — C8589 Other specified types of non-Hodgkin lymphoma, extranodal and solid organ sites: Secondary | ICD-10-CM

## 2011-05-12 MED ORDER — HEPARIN SOD (PORK) LOCK FLUSH 100 UNIT/ML IV SOLN
INTRAVENOUS | Status: AC
  Filled 2011-05-12: qty 5

## 2011-05-12 MED ORDER — SODIUM CHLORIDE 0.9 % IJ SOLN
INTRAMUSCULAR | Status: AC
  Filled 2011-05-12: qty 20

## 2011-05-12 MED ORDER — SODIUM CHLORIDE 0.9 % IJ SOLN
INTRAMUSCULAR | Status: AC
  Filled 2011-05-12: qty 10

## 2011-05-12 MED ORDER — SODIUM CHLORIDE 0.9 % IV SOLN
INTRAVENOUS | Status: DC
  Administered 2011-05-12: 09:00:00 via INTRAVENOUS

## 2011-05-13 ENCOUNTER — Encounter (HOSPITAL_BASED_OUTPATIENT_CLINIC_OR_DEPARTMENT_OTHER): Payer: BC Managed Care – PPO

## 2011-05-13 ENCOUNTER — Telehealth: Payer: Self-pay | Admitting: Critical Care Medicine

## 2011-05-13 VITALS — BP 115/72 | HR 62 | Temp 97.7°F

## 2011-05-13 DIAGNOSIS — C8589 Other specified types of non-Hodgkin lymphoma, extranodal and solid organ sites: Secondary | ICD-10-CM

## 2011-05-13 DIAGNOSIS — D801 Nonfamilial hypogammaglobulinemia: Secondary | ICD-10-CM

## 2011-05-13 MED ORDER — IMMUNE GLOBULIN (HUMAN) 10 GM/200ML IV SOLN
1.0000 g/kg | Freq: Once | INTRAVENOUS | Status: AC
  Administered 2011-05-13: 80 g via INTRAVENOUS
  Filled 2011-05-13: qty 1

## 2011-05-13 MED ORDER — IMMUNE GLOBULIN (HUMAN) 10 GM/200ML IV SOLN: 1.0000 g/kg | Freq: Once | INTRAVENOUS | Status: DC

## 2011-05-13 MED ORDER — HEPARIN SOD (PORK) LOCK FLUSH 100 UNIT/ML IV SOLN
INTRAVENOUS | Status: AC
  Filled 2011-05-13: qty 5

## 2011-05-13 MED ORDER — SODIUM CHLORIDE 0.9 % IV SOLN
Freq: Once | INTRAVENOUS | Status: AC
Start: 2011-05-13 — End: 2011-05-13
  Administered 2011-05-13: 09:00:00 via INTRAVENOUS

## 2011-05-13 MED ORDER — HEPARIN SOD (PORK) LOCK FLUSH 100 UNIT/ML IV SOLN
250.0000 [IU] | Freq: Once | INTRAVENOUS | Status: AC
  Administered 2011-05-13: 250 [IU] via INTRAVENOUS
  Filled 2011-05-13: qty 5

## 2011-05-13 MED ORDER — SODIUM CHLORIDE 0.9 % IJ SOLN
INTRAMUSCULAR | Status: AC
  Filled 2011-05-13: qty 10

## 2011-05-13 NOTE — Telephone Encounter (Signed)
Spoke with [name] from [facility] .  PT wants to know it she needs any labs drawn (?CBC or ESR) and also they need order to have picc line removed after 7 days of abx.  PT seems to thinks that Dr Delford Field would like to leave PICC in longer .  Please advise.

## 2011-05-13 NOTE — Progress Notes (Signed)
[name] presented for IVIG via PICC line. Proper placement of PICC confirmed by CXR. PICC line located lt arm . Good blood return present. PICC line flushed with 20ml NS and 300U/36ml Heparin when infusion complete. Procedure without incident. Patient tolerated procedure well. Home health seeing pt for picc line care

## 2011-05-13 NOTE — Telephone Encounter (Signed)
Do not remove picc  Obtain cbc please with diff

## 2011-05-13 NOTE — Telephone Encounter (Signed)
Spoke with Dennie Bible at [facility] and given order to keep PICC line in and to draw a CBC/Diff.

## 2011-05-14 IMAGING — CR DG CHEST 2V
2 series · 2 of 2 positions shown · non-contrast
Comparison: 09/13/2009, 08/08/2009.

CLINICAL DATA: Non-Hodgkin's lymphoma.  Pneumonia.

CHEST - 2 VIEW

[view not recorded (1 of 2)]
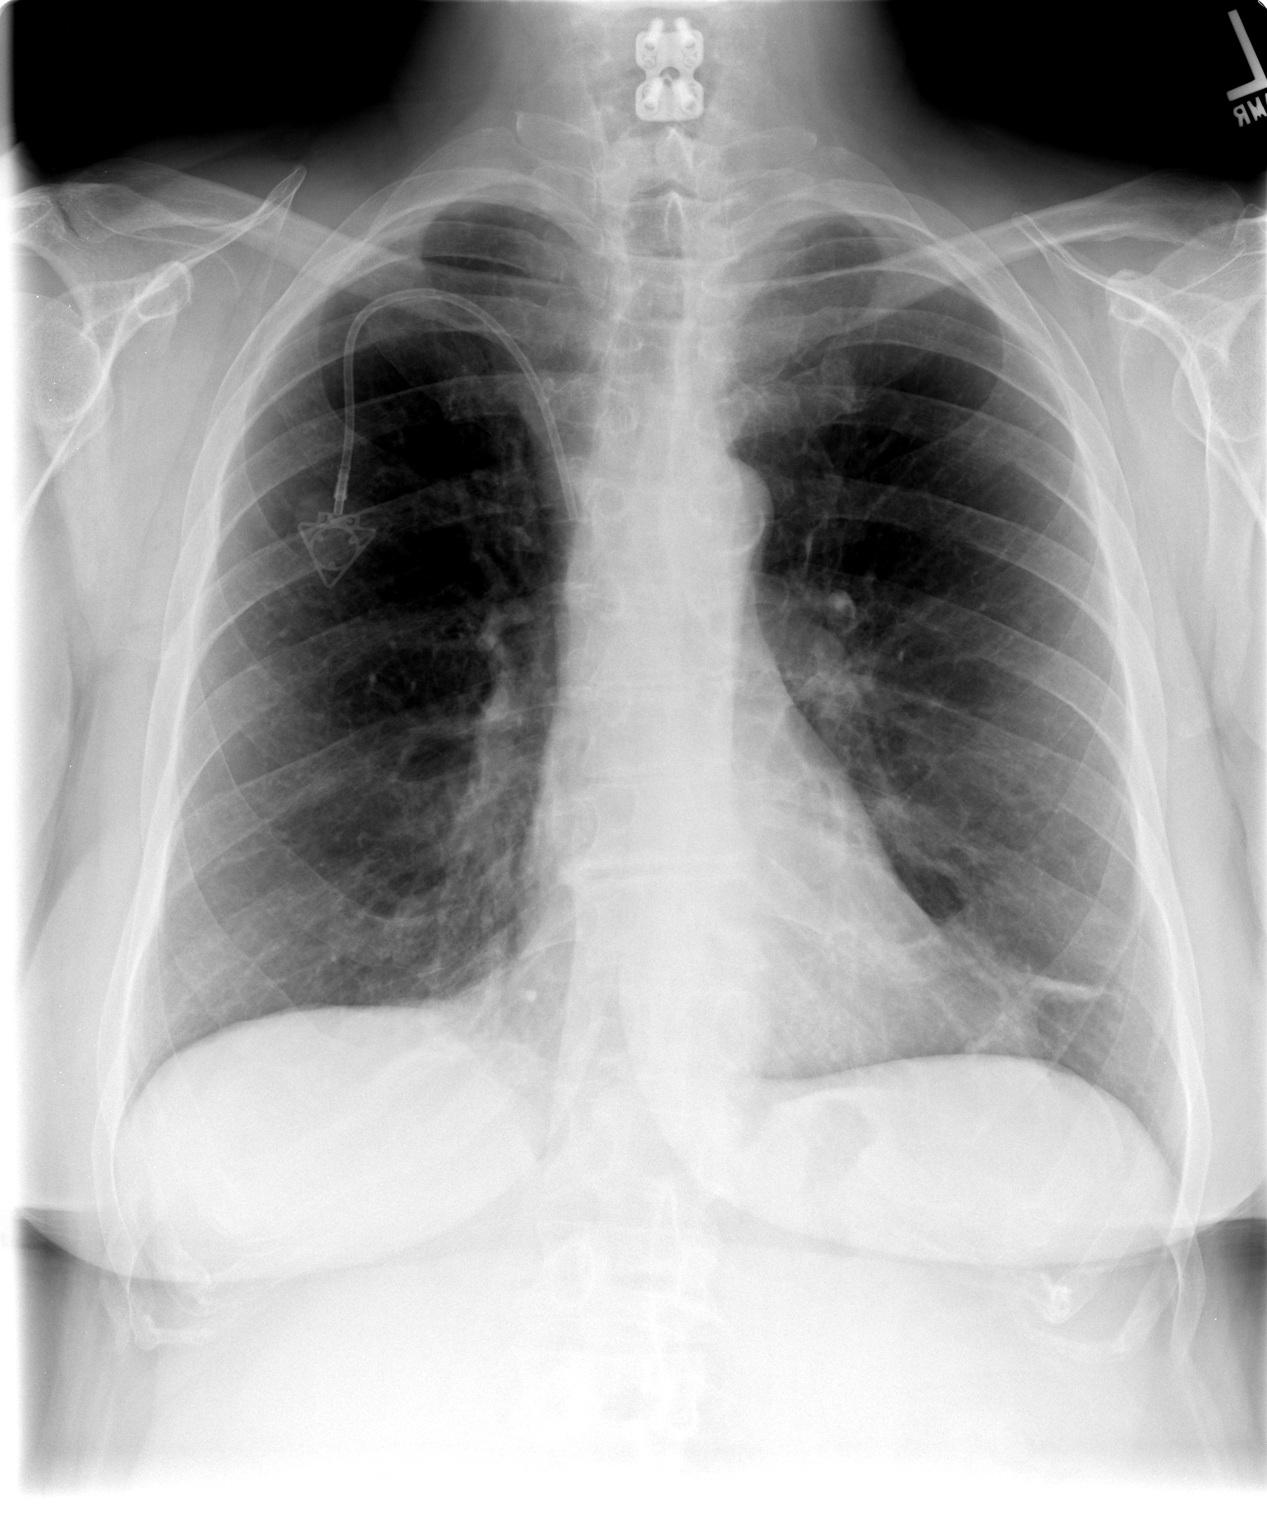

[view not recorded (2 of 2)]
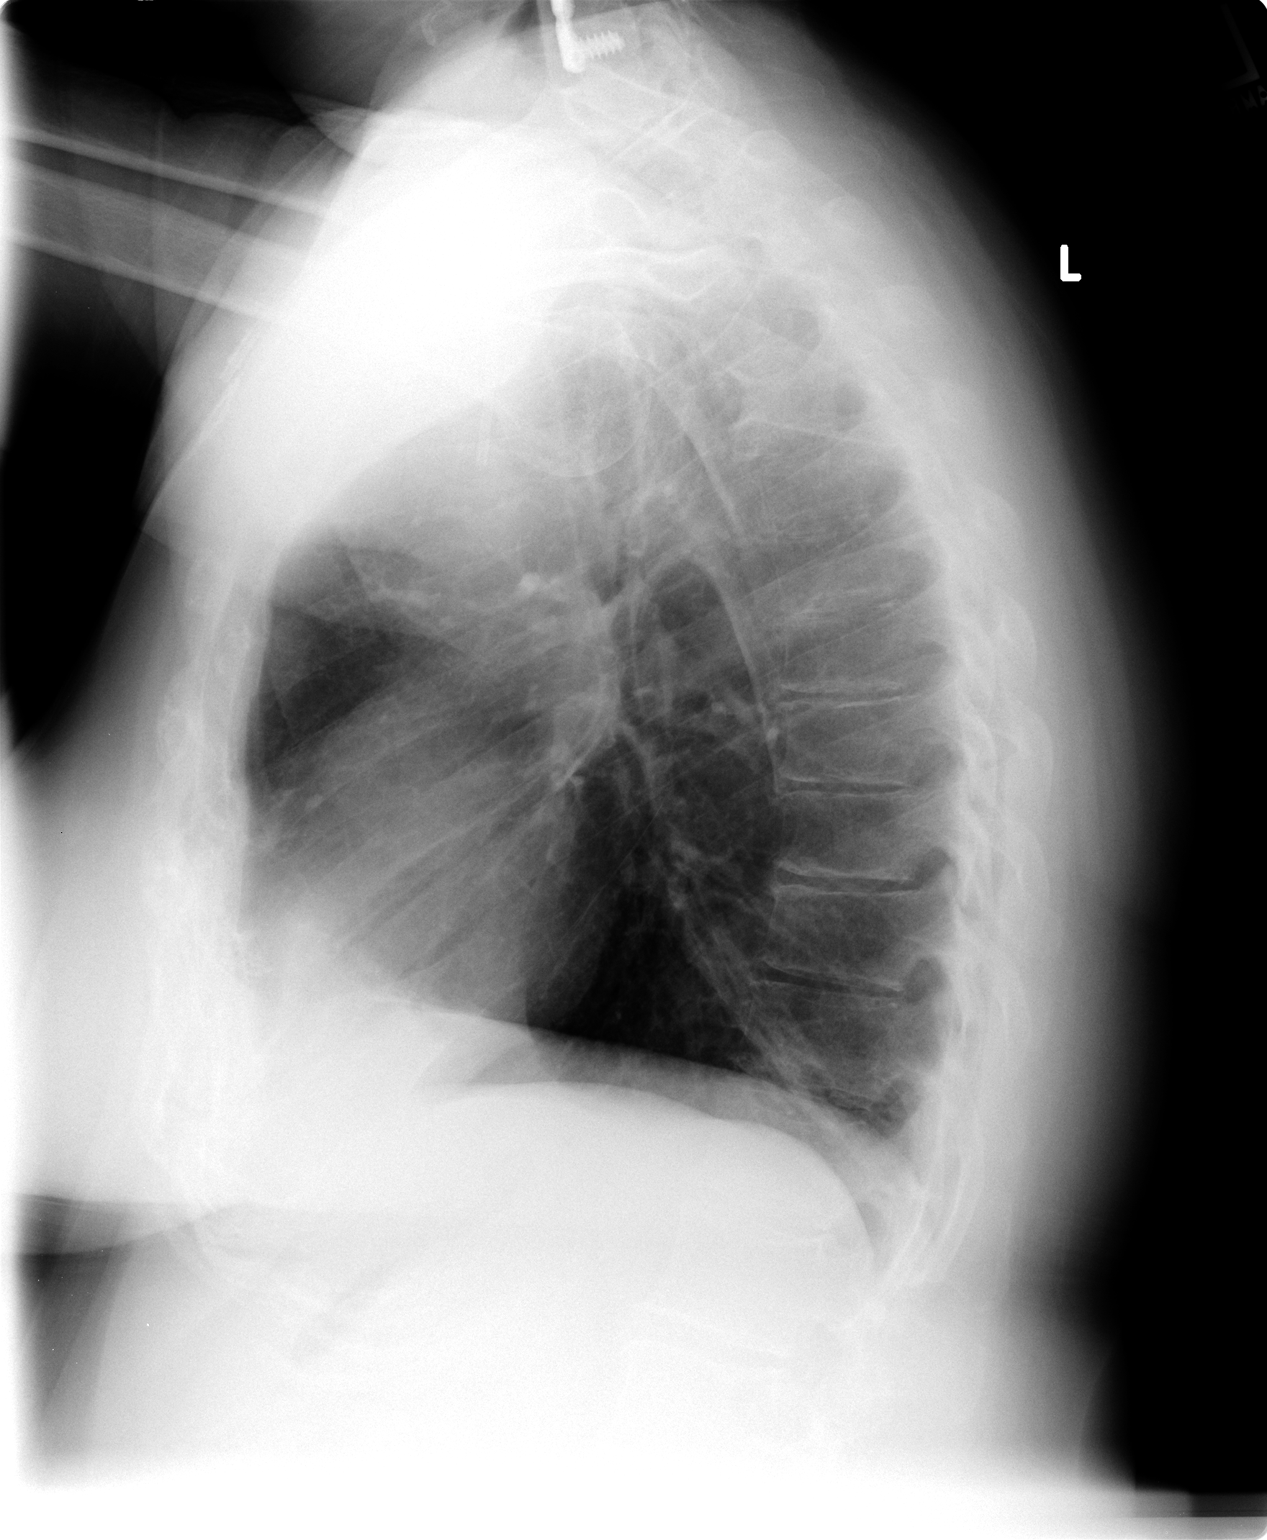

[2 of 2 positions shown; findings below may reference images not displayed]

FINDINGS: Lower cervical ACDF.  Right subclavian Port-A-Cath with
the tip in the upper SVC.  Linear scarring is present along the
cardiac apex, unchanged from 08/08/2009.  Improvement in opacity at
the medial right lung base on the frontal view.  No definite
airspace disease on today's exam.  No effusion.  No plain film
evidence of adenopathy.
IMPRESSION: Improvement in medial right lung base opacity compatible with
resolving pneumonia.

## 2011-05-16 ENCOUNTER — Telehealth: Payer: Self-pay | Admitting: Critical Care Medicine

## 2011-05-16 NOTE — Telephone Encounter (Signed)
Called spoke with Misty Stanley, advised that PEW okayed for 5.14 or 5.15. For the re-draw of pt's labs.  Misty Stanley verbalized her understanding and will call if anything further is needed.

## 2011-05-16 NOTE — Telephone Encounter (Signed)
That is fine 

## 2011-05-16 NOTE — Telephone Encounter (Signed)
Called spoke with Misty Stanley who stated that she was unable to obtain labs on pt today (cbcd) - "stick her 3 times" with no success.  Will have to send another nurse.  Will either be tomorrow (Tuesday) or Wednesday.  Misty Stanley is asking if this is ok.  Misty Stanley did verify that pt's PICC line is still in place.  Dr Delford Field please advise, thanks.

## 2011-05-21 IMAGING — CR DG CHEST 2V
2 series · 2 of 2 positions shown · non-contrast
Comparison: 09/21/2009

CLINICAL DATA: Non-Hodgkins lymphoma, pneumonia, follow-up, on
chemotherapy, history asthma, hypertension

CHEST - 2 VIEW

[view not recorded (1 of 2)]
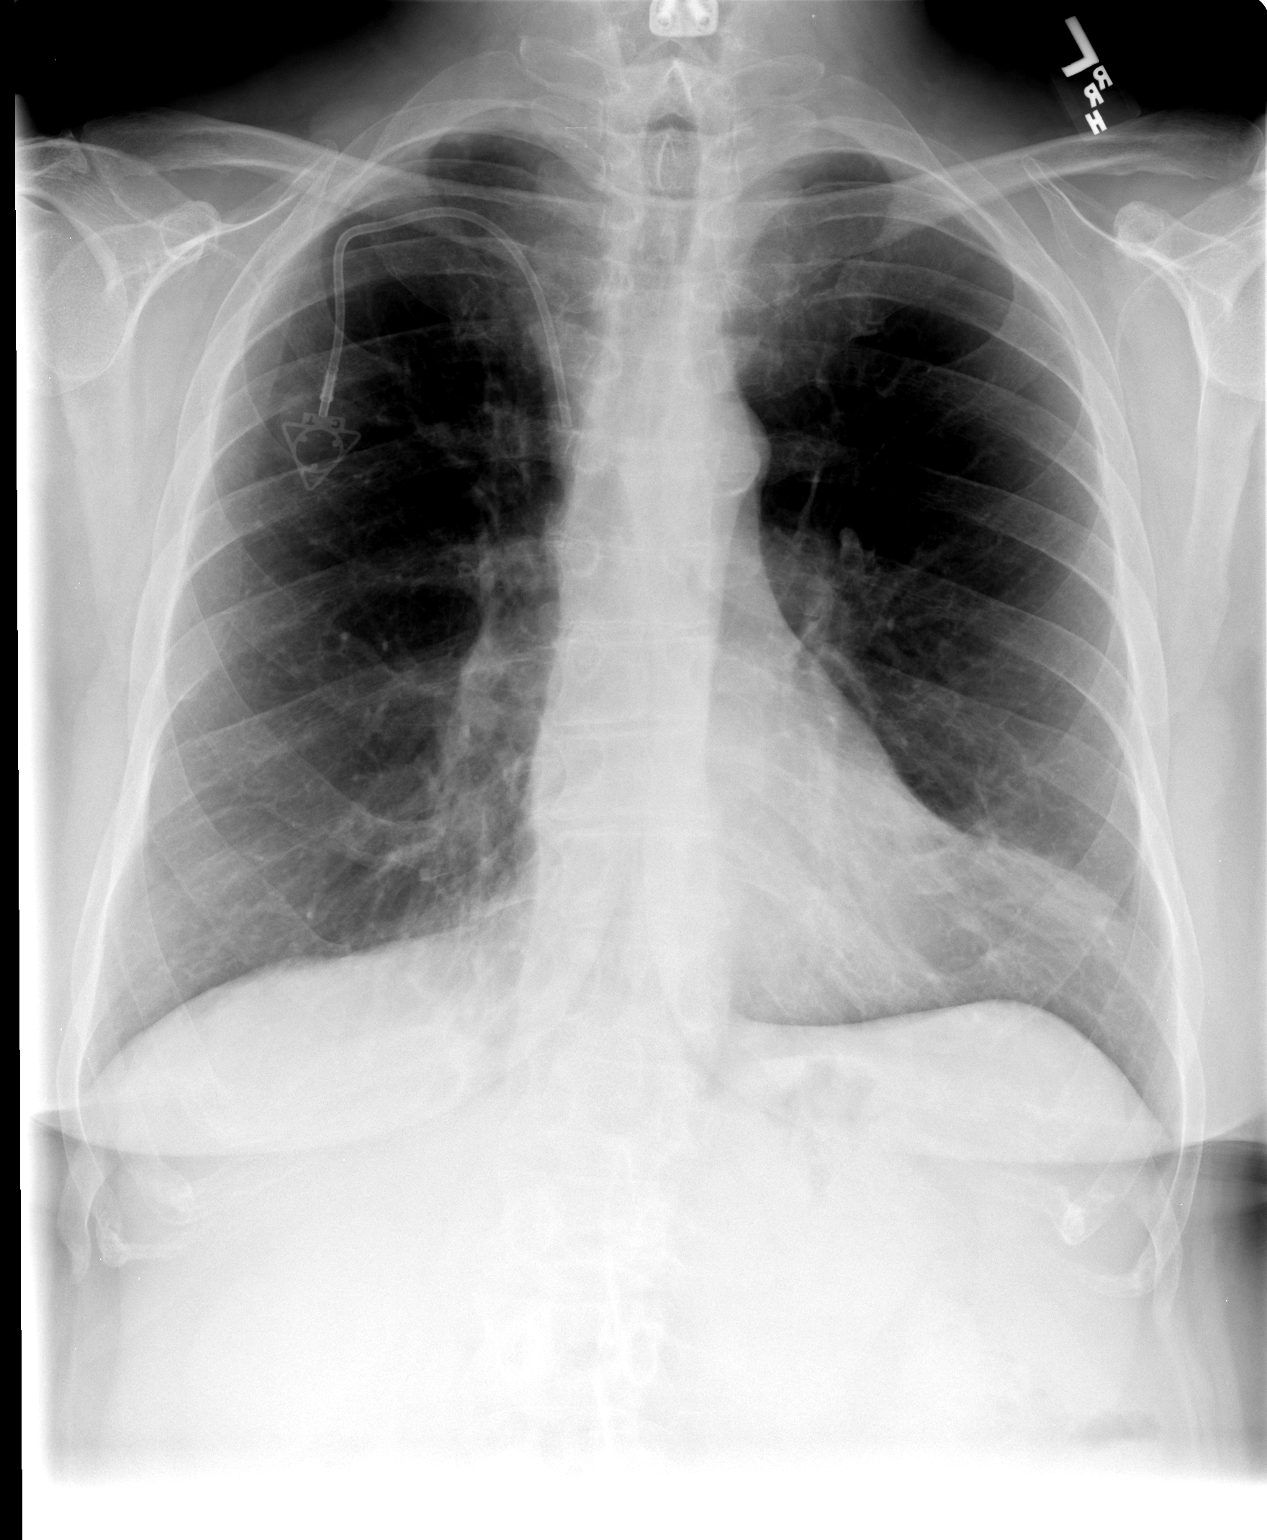

[view not recorded (2 of 2)]
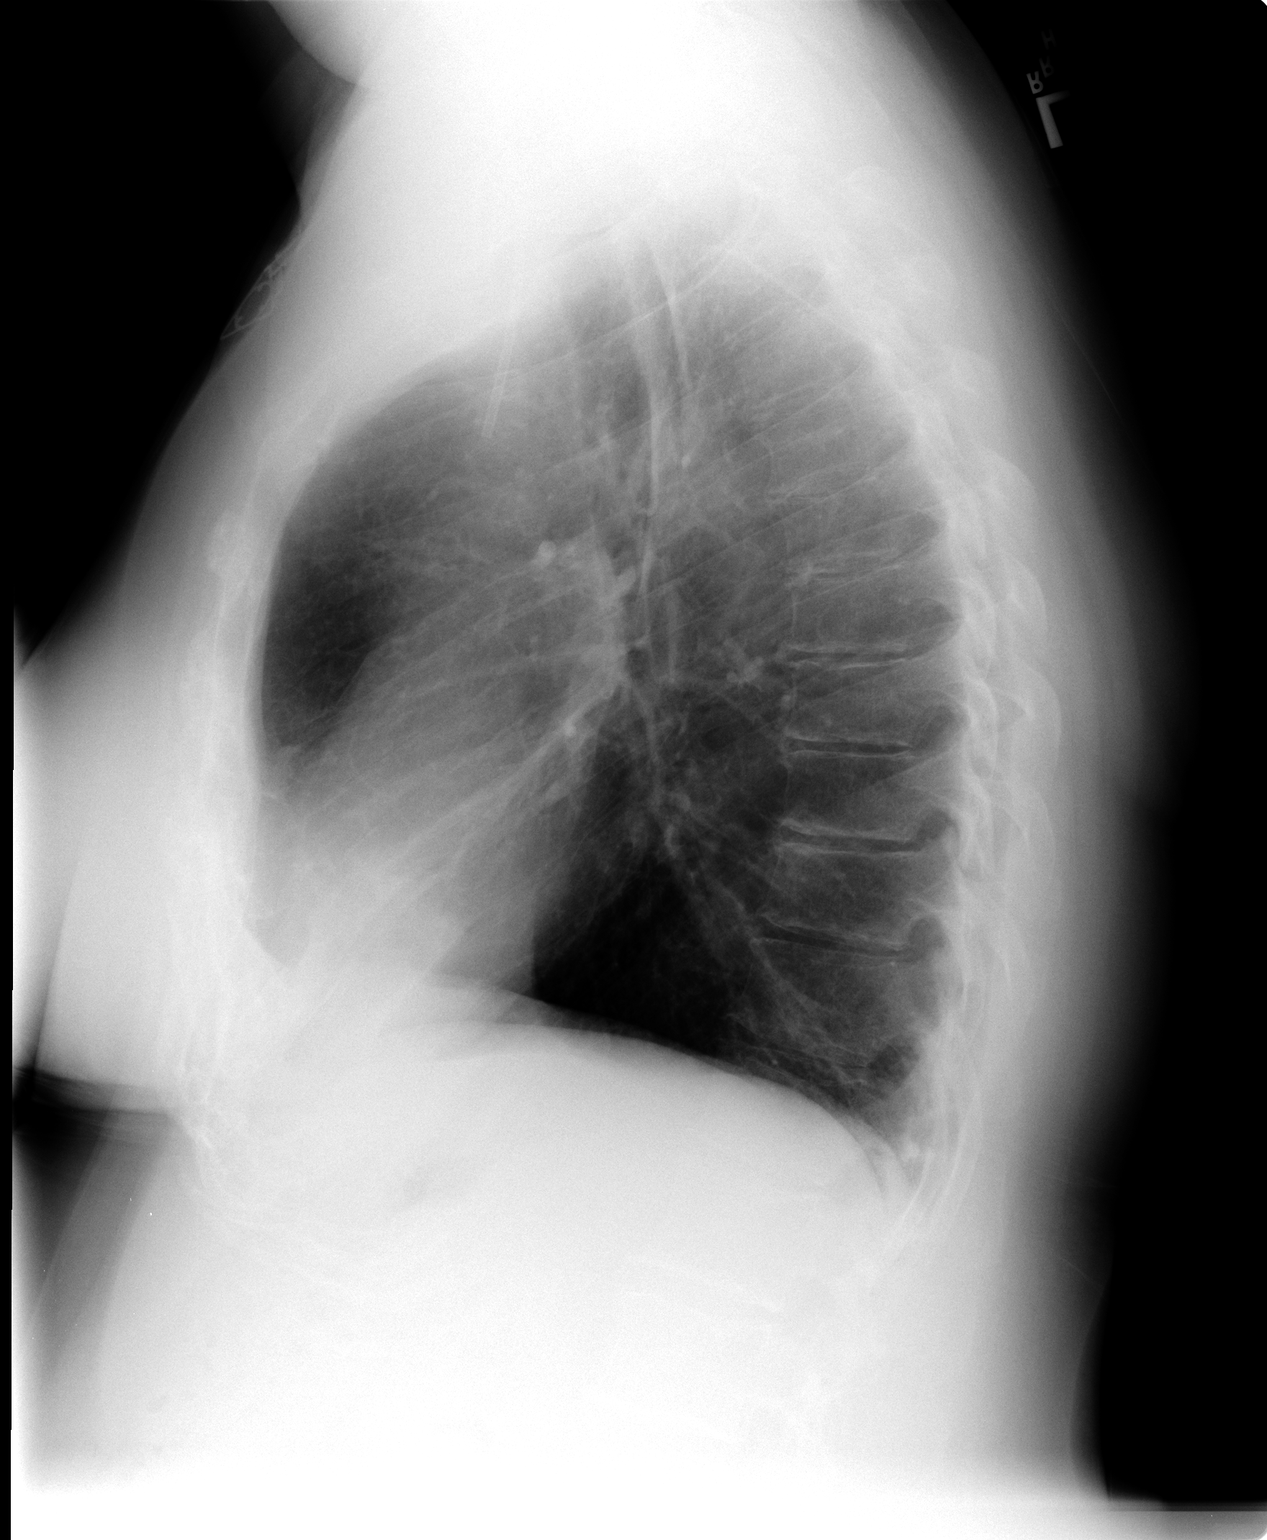

[2 of 2 positions shown; findings below may reference images not displayed]

FINDINGS: Right subclavian Port-A-Cath, tip SVC.
Prior cervical spine fusion.
Normal heart size, mediastinal contours, and pulmonary vascularity.
Atherosclerotic calcification aortic arch.
Decreased lingular atelectasis.
Medial right infrahilar markings unchanged.
No new areas of consolidation, pleural effusion or pneumothorax.
Thoracolumbar scoliosis, dextroconvex.
IMPRESSION: No acute abnormalities.

## 2011-05-23 IMAGING — RF DG FLUORO GUIDE NDL PLC/BX
1 series · 1 of 1 positions shown · non-contrast
Comparison: none

CLINICAL DATA: Intrathecal methotrexate injection for systemic
therapy for non Hodgkin's lymphoma.

FLUOROSCOPICALLY GUIDED LUMBAR PUNCTURE FOR INTRATHECAL
CHEMOTHERAPY
Fluoroscopy time:  0.7 minutes.
TECHNIQUE: Informed consent was obtained from the patient prior to
the procedure, including potential complications of headache,
allergy, and pain.   With the patient prone, the lower back was
prepped with Betadine.  1% Lidocaine was used for local anesthesia.
Lumbar puncture was performed at the L3-L4 level using a 20 gauge
needle with return of 3 ml clear CSF.  3 ml of  of  methotrexate
was injected into the subarachnoid space.  The patient tolerated
the procedure well without apparent complication.

[Series 1: run · 1 of 1 slices shown]
[im 1/1]
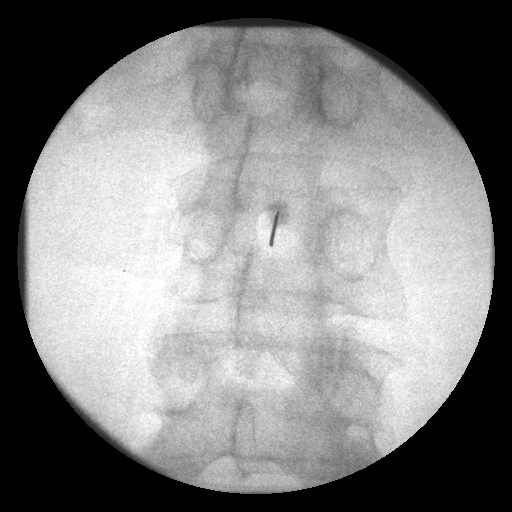

[1 of 1 positions shown; findings below may reference images not displayed]

IMPRESSION: Successful intrathecal injection of chemotherapy.

## 2011-05-24 ENCOUNTER — Telehealth: Payer: Self-pay | Admitting: Critical Care Medicine

## 2011-05-24 NOTE — Telephone Encounter (Signed)
Do not remove picc until seen by TP or PW

## 2011-05-24 NOTE — Telephone Encounter (Signed)
lmomtcb x1 for [name] 

## 2011-05-24 NOTE — Telephone Encounter (Signed)
I spoke with [name] and she states pt finished abx last Thursday and is wanting to know if okay to remove picc line. She is aware PW was out of the office until Thursday and was fine with a response then. Also pt has an apt scheduled with TP on Thursday as well. Please advise PW thanks

## 2011-05-25 NOTE — Telephone Encounter (Signed)
Called [phone]  - LMTCB on Jennifer's named VM

## 2011-05-26 ENCOUNTER — Ambulatory Visit (INDEPENDENT_AMBULATORY_CARE_PROVIDER_SITE_OTHER): Payer: BC Managed Care – PPO | Admitting: Adult Health

## 2011-05-26 ENCOUNTER — Encounter: Payer: Self-pay | Admitting: Adult Health

## 2011-05-26 VITALS — BP 108/64 | HR 63 | Temp 97.5°F | Ht 62.0 in | Wt 172.6 lb

## 2011-05-26 DIAGNOSIS — J449 Chronic obstructive pulmonary disease, unspecified: Secondary | ICD-10-CM

## 2011-05-26 NOTE — Telephone Encounter (Signed)
Pt aware to discuss at OV today.[name], CMA

## 2011-05-26 NOTE — Patient Instructions (Addendum)
Mucinex Twice daily  As needed  Cough/congestion  Fluids and rest  Saline nasal rinses As needed   I will be in touch regarding your PICC line.  Follow up Dr. Delford Field  In 3-4 weeks and As needed     Late add  Extend Cefepime for additional 7 days  Leave picc in for now Ov with Dr. Delford Field  In 2 weeks

## 2011-05-26 NOTE — Progress Notes (Signed)
Subjective:    Patient ID: [name], female    DOB: 03-31-1955, 56 y.o.   MRN: [id number]  HPI  56 y.o.WF  Asthma , hypogammaglobulinemia, chronic sinusits d/t pseudomonas  02/14/11 Was given IgG IV per heme/onc.  Now feels better.  No real cough except in early am. Pt sneezing and yellow mucus over the past week.    Now off ABX [facility] Rx further ABX  3/13 No real change from last ov.  Sl yellow mucus in the am.  Still gets gammaglob IV per heme/onc. Pt to receive Crx this week.  Rituxan is the only agent . No real wheeze.  No chest pains.  No excessSaba    Tolerated bactrim well 2/13 and helped sinus disease   PUL ASTHMA HISTORY 05/11/2011 03/16/2011 08/11/2010  Symptoms Throughout the day 0-2 days/week Daily  Nighttime awakenings Often--7/wk 0-2/month 3-4/month  Interference with activity Some limitations No limitations Minor limitations  SABA use Several times/day 0-2 days/wk > 2 days/wk--not > 1 x/day  Exacerbations requiring oral steroids 2 or more / year 0-1 / year 0-1 / year   05/11/2011 Coughing for one month.  Productive in AM of yellow.  Some mixed with blood over past weekend.  No chest pain, just heavy.  Notes nasal drip yellow.  Severe dry hacky cough.    IV IgG to start in 24hrs. Notes fever at home 101 every afternoon, for one month.    Last Chemorx was 3/13. >>rx IV Cefepime x 7 d, and steroid taper   05/26/2011 Follow up  Returns for 2 week follow up sinusitis. She was treated with 7 days of Cefepime via PICC .   - reports symptoms have improved but still having prod cough with yellow mucus, some wheezing and SOB. No hemoptysis or chest pain  No edema  Still blowing out yellow mucus w/ foul odor.    Past Medical History  Diagnosis Date  . Non Hodgkin's lymphoma   . Allergic rhinitis   . GERD (gastroesophageal reflux disease)   . Asthma   . Chronic sinusitis   . Respiratory failure   . Cavitary lung disease      Family History  Problem Relation Age of  Onset  . Emphysema Mother   . Allergies Father   . Asthma Father     as a child  . Leukemia Maternal Grandmother   . Diabetes Brother   . Stroke Mother      History   Social History  . Marital Status: Divorced    Spouse Name: N/A    Number of Children: 3  . 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 Smoking status: Never Smoker   . Smokeless tobacco: Never Used  . Alcohol Use: No  . Drug Use: Not on file  . Sexually Active: Not on file   Other Topics Concern  . Not on file   Social History Narrative  . No narrative on file     Allergies  Allergen Reactions  . Meperidine Hcl   . Montelukast Sodium      Outpatient Prescriptions Prior to Visit  Medication Sig Dispense Refill  . ACCU-CHEK AVIVA PLUS test strip as directed.      [name] Kitchen acyclovir (ZOVIRAX) 200 MG capsule 2 capsules twice per day       . albuterol (PROVENTIL) (2.5 MG/3ML) 0.083% nebulizer solution Take 2.5 mg by nebulization every 4 (four)  hours as needed.  120 mL  6  . albuterol (VENTOLIN HFA) 108 (90 BASE) MCG/ACT inhaler Inhale 2 puffs into the lungs every 6 (six) hours as needed.  1 Inhaler  5  . ceFEPIme (MAXIPIME) 1 G injection Inject 1 g into the muscle every 12 (twelve) hours.  14 each  0  . citalopram (CELEXA) 20 MG tablet Take 20 mg by mouth every morning.        [name] Kitchen EPINEPHrine (EPIPEN IJ) As needed for severe allergic reaction       . furosemide (LASIX) 20 MG tablet Take 1 tablet by mouth as needed.      . Hydrocod Polst-Chlorphen Polst (TUSSICAPS) 10-8 MG CP12 Take 1 capsule by mouth 2 (two) times daily as needed.  20 each  0  . Lancets (ACCU-CHEK MULTICLIX) lancets as directed.      . Mometasone Furo-Formoterol Fum 200-5 MCG/ACT AERO Inhale 2 Inhalers into the lungs 2 (two) times daily.  1 Inhaler  6  . omeprazole (PRILOSEC) 20 MG capsule Take 1 capsule (20 mg total) by mouth 2 (two) times daily.  60 capsule  6  . potassium  chloride 40 MEQ/15ML (20%) LIQD 2 tablespoons twice daily       . simvastatin (ZOCOR) 80 MG tablet Take 80 mg by mouth at bedtime.        [name] Kitchen warfarin (COUMADIN) 10 MG tablet Take by mouth daily. Currently taking 6 mg alternation with 7mg       . zafirlukast (ACCOLATE) 20 MG tablet Take 1 tablet (20 mg total) by mouth 2 (two) times daily.  60 tablet  5  . predniSONE (DELTASONE) 10 MG tablet Take 4 for four days 3 for four days 2 for four days 1 daily for four days then 1/2 daily and stay  40 tablet  6   Facility-Administered Medications Prior to Visit  Medication Dose Route Frequency Provider Last Rate Last Dose  . 0.9 %  sodium chloride infusion   Intravenous Continuous Randall An, MD 20 mL/hr at 05/12/11 1610       Review of Systems  Constitutional:   No  weight loss, night sweats,  Fevers, chills, fatigue, lassitude. HEENT:   Notes  headaches,  Difficulty swallowing,  Tooth/dental problems,  Sore throat,                No sneezing, itching, ear ache, nasal congestion, +++ post nasal drip,   CV:  No chest pain,  Orthopnea, PND, swelling in lower extremities, anasarca, dizziness, palpitations  GI  No heartburn, indigestion, abdominal pain, nausea, vomiting, diarrhea, change in bowel habits, loss of appetite  Resp: Notes  shortness of breath with exertion and  at rest.  Notes  excess mucus, notes  productive cough,  No non-productive cough,  No coughing up of blood.  Notes  change in color of mucus.  No wheezing.  No chest wall deformity  Skin: no rash or lesions.  GU: no dysuria, change in color of urine, no urgency or frequency.  No flank pain.  MS:  No joint pain or swelling.  No decreased range of motion.  No back pain.  Psych:  No change in mood or affect. No depression or anxiety.  No memory loss.     Objective:   Physical Exam  Filed Vitals:   05/26/11 1643  BP: 108/64  Pulse: 63  Temp: 97.5 F (36.4 C)  TempSrc: Oral  Height: 5\' 2"  (1.575 m)  Weight: 172 lb 9.6  oz (78.291 kg)  SpO2: 95%    Gen: Pleasant, well-nourished, in no distress,  normal affect  ENT: No lesions,  mouth clear,  oropharynx clear,  Mild nasal redness   Neck: No JVD, no TMG, no carotid bruits  Lungs: No use of accessory muscles, no dullness to percussion, coarse BS w/ few rhonchi   Cardiovascular: RRR, heart sounds normal, no murmur or gallops, no peripheral edema  Abdomen: soft and NT, no HSM,  BS normal  Musculoskeletal: No deformities, no cyanosis or clubbing  Neuro: alert, non focal  Skin: Warm, no lesions or rashes      Assessment & Plan:   No problem-specific assessment & plan notes found for this encounter.   Updated Medication List Outpatient Encounter Prescriptions as of 05/26/2011  Medication Sig Dispense Refill  . ACCU-CHEK AVIVA PLUS test strip as directed.      [name] Kitchen acyclovir (ZOVIRAX) 200 MG capsule 2 capsules twice per day       . albuterol (PROVENTIL) (2.5 MG/3ML) 0.083% nebulizer solution Take 2.5 mg by nebulization every 4 (four) hours as needed.  120 mL  6  . albuterol (VENTOLIN HFA) 108 (90 BASE) MCG/ACT inhaler Inhale 2 puffs into the lungs every 6 (six) hours as needed.  1 Inhaler  5  . benzonatate (TESSALON) 200 MG capsule Take 1 capsule by mouth Three times daily as needed.      [name] Kitchen ceFEPIme (MAXIPIME) 1 G injection Inject 1 g into the muscle every 12 (twelve) hours.  14 each  0  . citalopram (CELEXA) 20 MG tablet Take 20 mg by mouth every morning.        [name] Kitchen EPINEPHrine (EPIPEN IJ) As needed for severe allergic reaction       . furosemide (LASIX) 20 MG tablet Take 1 tablet by mouth as needed.      . Hydrocod Polst-Chlorphen Polst (TUSSICAPS) 10-8 MG CP12 Take 1 capsule by mouth 2 (two) times daily as needed.  20 each  0  . Lancets (ACCU-CHEK MULTICLIX) lancets as directed.      . Mometasone Furo-Formoterol Fum 200-5 MCG/ACT AERO Inhale 2 Inhalers into the lungs 2 (two) times daily.  1 Inhaler  6  . omeprazole (PRILOSEC) 20 MG capsule Take 1  capsule (20 mg total) by mouth 2 (two) times daily.  60 capsule  6  . potassium chloride 40 MEQ/15ML (20%) LIQD 2 tablespoons twice daily       . simvastatin (ZOCOR) 80 MG tablet Take 80 mg by mouth at bedtime.        [name] Kitchen warfarin (COUMADIN) 10 MG tablet Take by mouth daily. Currently taking 6 mg alternation with 7mg       . zafirlukast (ACCOLATE) 20 MG tablet Take 1 tablet (20 mg total) by mouth 2 (two) times daily.  60 tablet  5  . predniSONE (DELTASONE) 10 MG tablet Take 4 for four days 3 for four days 2 for four days 1 daily for four days then 1/2 daily and stay  40 tablet  6   Facility-Administered Encounter Medications as of 05/26/2011  Medication Dose Route Frequency Provider Last Rate Last Dose  . 0.9 %  sodium chloride infusion   Intravenous Continuous Randall An, MD 20 mL/hr at 05/12/11 [phone]

## 2011-05-27 ENCOUNTER — Other Ambulatory Visit: Payer: Self-pay | Admitting: Adult Health

## 2011-05-27 DIAGNOSIS — J329 Chronic sinusitis, unspecified: Secondary | ICD-10-CM

## 2011-05-27 MED ORDER — CEFEPIME HCL 1 GM/50ML IV SOLN: 1.0000 g | Freq: Two times a day (BID) | INTRAVENOUS | Status: DC

## 2011-05-27 MED ORDER — CEFEPIME HCL 1 G IJ SOLR: 1.0000 g | Freq: Two times a day (BID) | INTRAMUSCULAR | Status: DC

## 2011-05-27 NOTE — Assessment & Plan Note (Signed)
Slow to resolve flare with sinusitis   Plan:  Extend Cefepime x 7 days via PICC  Keep PICC and follow up Dr. Delford Field  In 2 weeks and As needed

## 2011-06-06 ENCOUNTER — Telehealth: Payer: Self-pay | Admitting: Critical Care Medicine

## 2011-06-06 NOTE — Telephone Encounter (Signed)
lmtcb for lisa with [facility]//Per tp leave pic line in until f/u with dr Delford Field on 06/17/11

## 2011-06-06 NOTE — Telephone Encounter (Signed)
[name] called back and stated that she will cont the home visit once weekly x the next 2 weeks and do PICC line care and pt has appt with PW on 6-14 and PW will decide on the PICC line at that time. Nothing further was needed.

## 2011-06-07 IMAGING — PT NM PET TUM IMG RESTAG (PS) SKULL BASE T - THIGH
5 series · 25 of 25 positions shown · non-contrast
Comparison: PET CT scan 07/10/2009 No focal hypermetabolic activity
to suggest skeletal metastasis.

CLINICAL DATA: Initial treatment strategy for non Hodgkin's
lymphoma.  High-grade non-Hodgkins lymphoma involving the left
kidney and retroperitoneal lymph nodes.  The patient completed 3
cycles of chemotherapy with last therapy 2 weeks prior.

NUCLEAR MEDICINE PET SKULL BASE TO THIGH
Fasting Blood Glucose:  125
TECHNIQUE: 16.0 mCi F-18 FDG was injected intravenously via the
right antecubital fossa.  Full-ring PET imaging was performed from
the skull base through the mid-thighs  102  minutes after
injection.  CT dat a was obtained and used for attenuation
correction and anatomic localization only.  (This was not acquired
as a diagnostic CT examination.)

[Series 1: pet ac · axial · 3.3mm · 4.69mm/px · z∈[-865,+5]mm · 6 of 267 slices shown]
[im 1/267]
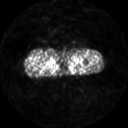
[im 54/267]
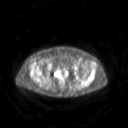
[im 107/267]
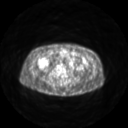
[im 160/267]
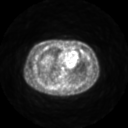
[im 213/267]
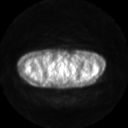
[im 267/267]
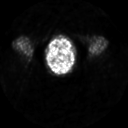

[Series 2: ct images · axial · 3.8mm · 0.98mm/px · z∈[-865,+5]mm · 6 of 267 slices shown]
[im 1/267]
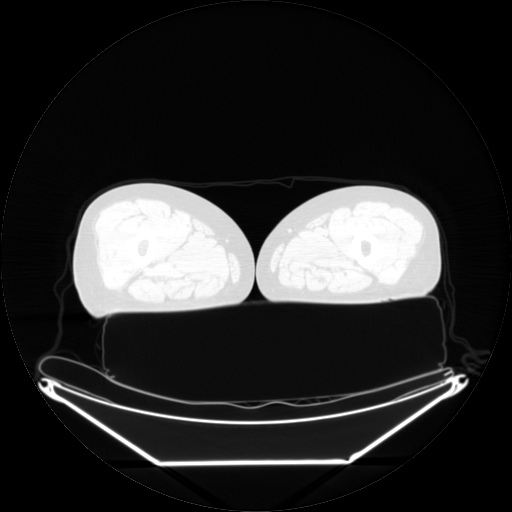
[im 54/267]
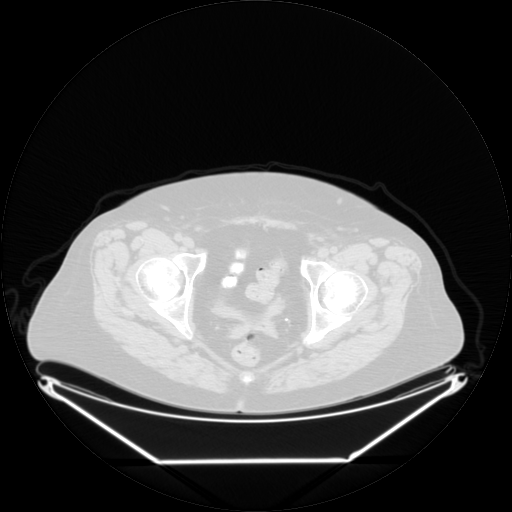
[im 107/267]
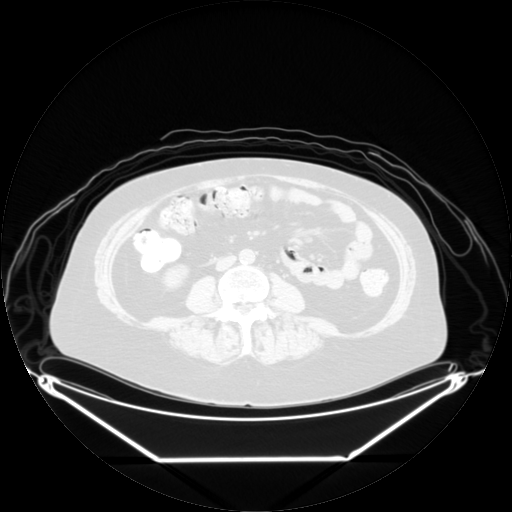
[im 160/267]
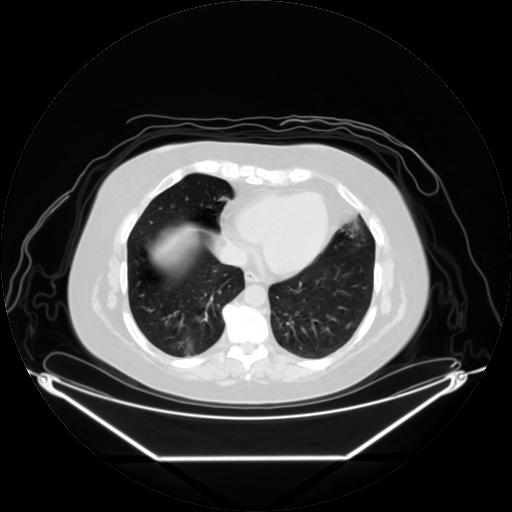
[im 213/267]
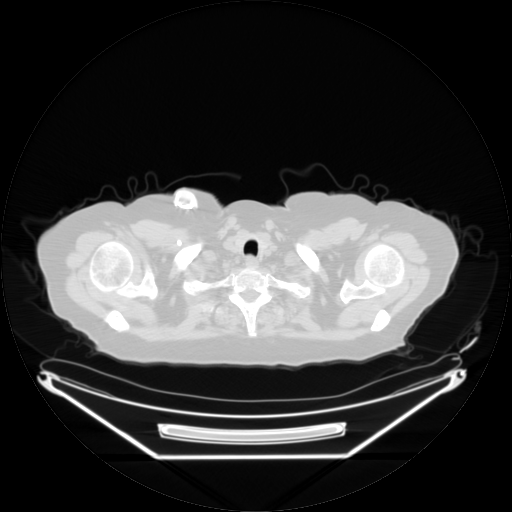
[im 267/267  brain]
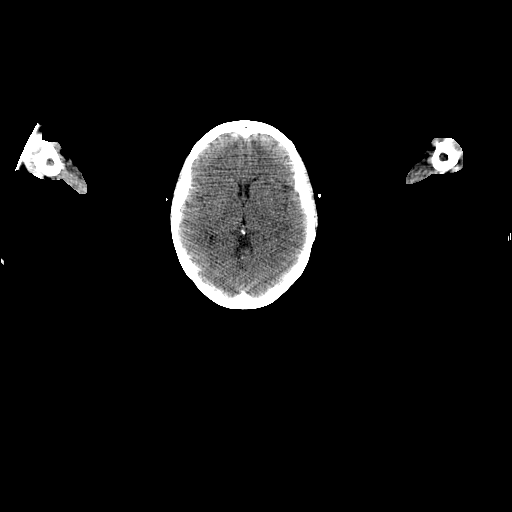

[Series 2: pet nac · axial · 3.3mm · 4.69mm/px · z∈[-865,+5]mm · 6 of 267 slices shown]
[im 1/267]
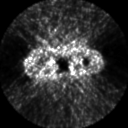
[im 54/267]
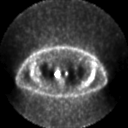
[im 107/267]
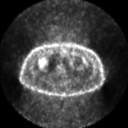
[im 160/267]
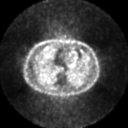
[im 213/267]
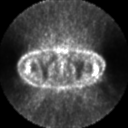
[im 267/267]
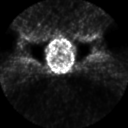

[Series 151: reformatted · axial · 3.3mm · 3.91mm/px · z∈[-865,+5]mm · 6 of 265 slices shown (1 of 2)]
[im 1/265]
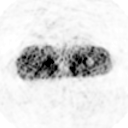
[im 53/265]
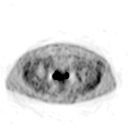
[im 106/265]
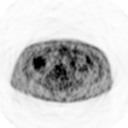
[im 159/265]
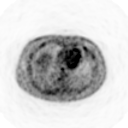
[im 212/265]
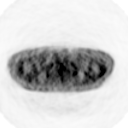
[im 265/265]
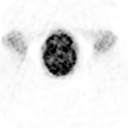

[Series 153: reformatted · coronal · 4.7mm · 6.98mm/px · 1 of 62 slices shown (2 of 2)]
[im 1/62]
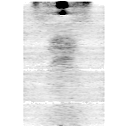

[25 of 25 positions shown; findings below may reference images not displayed]

FINDINGS: Neck:No hypermetabolic nodes in the neck.

Chest:No hypermetabolic mediastinal or hilar nodes.  No suspicious
pulmonary nodules.  Previously described right axillary activity is
resolved.

Abdomen / Pelvis:There is a dramatic decrease in size and metabolic
activity of the left renal mass.  The residual lesion measures 18 x
24 mm compared to approximately 67 x 62 mm on prior.  The residual
mass does not have significant associated metabolic activity.
Likewise the intensely hypermetabolic retroperitoneal lymph nodes
have also resolved.  There are only residual small sub centimeter
periaortic lymph nodes which do not have appreciable metabolic
activity.  Likewise the pericaval mass adjacent to the liver  is no
longer detectable.

There is linear activity associated with the right piriformis
muscle without associated mass.  There is  more generalized uptake
noted within the gluteus muscles on the left and right.

Skeleton:No focal hypermetabolic activity to suggest skeletal
metastasis.
IMPRESSION: 1.  Complete response to chemotherapy with dramatic reduction in
size and metabolic activity of left renal lymphoma.  Likewise
resolution of metabolic activity of retroperitoneal adenopathy.
2.  Linear uptake within the right piriformis muscle suggest muscle
inflammation.

## 2011-06-13 IMAGING — RF DG FLUORO GUIDE SPINAL/SI JT INJ*L*
1 series · 1 of 1 positions shown · non-contrast
Comparison: none

CLINICAL DATA: Non-Hodgkins lymphoma.  For intrathecal
chemotherapy.

FLUOROSCOPICALLY GUIDED LUMBAR PUNCTURE FOR INTRATHECAL
CHEMOTHERAPY
Fluoroscopy time:  0.5 minutes.
TECHNIQUE: Informed consent was obtained from the patient prior to
the procedure, including potential complications of headache,
allergy, and pain.   With the patient prone, the lower back was
prepped with Betadine.  1% Lidocaine was used for local anesthesia.
Lumbar puncture was performed at the L3-L4 level using a 20 gauge
needle with return of 3 ml clear colorless CSF.  12 mg  of
methotrexate and 50 mg of hydrocortisone in saline was injected
into the subarachnoid space.  The patient tolerated the procedure
well without apparent complication.

[Series 1: run · 1 of 1 slices shown]
[im 1/1]
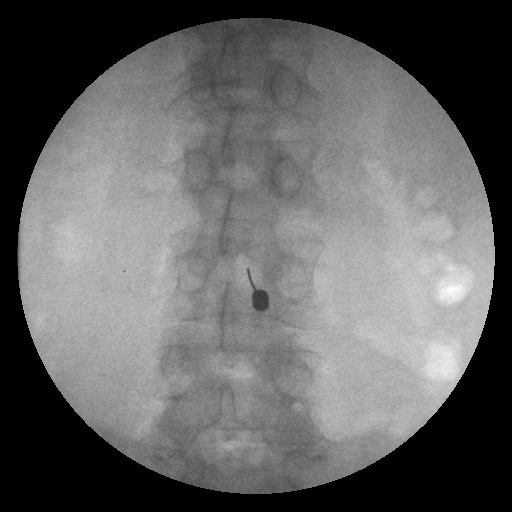

[1 of 1 positions shown; findings below may reference images not displayed]

IMPRESSION: Successful fluoroscopically guided lumbar puncture and intrathecal
chemotherapy injection.

## 2011-06-17 ENCOUNTER — Encounter: Payer: Self-pay | Admitting: Critical Care Medicine

## 2011-06-17 ENCOUNTER — Ambulatory Visit (INDEPENDENT_AMBULATORY_CARE_PROVIDER_SITE_OTHER): Payer: BC Managed Care – PPO | Admitting: Critical Care Medicine

## 2011-06-17 VITALS — BP 116/74 | HR 63 | Temp 98.4°F | Ht 62.0 in | Wt 178.6 lb

## 2011-06-17 DIAGNOSIS — J019 Acute sinusitis, unspecified: Secondary | ICD-10-CM

## 2011-06-17 DIAGNOSIS — J329 Chronic sinusitis, unspecified: Secondary | ICD-10-CM

## 2011-06-17 DIAGNOSIS — J45909 Unspecified asthma, uncomplicated: Secondary | ICD-10-CM

## 2011-06-17 MED ORDER — CEFTAZIDIME 1 G IJ SOLR: 1.0000 g | Freq: Two times a day (BID) | INTRAMUSCULAR | Status: DC

## 2011-06-17 NOTE — Progress Notes (Signed)
Subjective:    Patient ID: [name], female    DOB: [birth year], 56 y.o.   MRN: [id number]  HPI  56 y.o.WF  Asthma , hypogammaglobulinemia, chronic sinusits d/t pseudomonas  02/14/11 Was given IgG IV per heme/onc.  Now feels better.  No real cough except in early am. Pt sneezing and yellow mucus over the past week.    Now off ABX [facility] Rx further ABX  3/13 No real change from last ov.  Sl yellow mucus in the am.  Still gets gammaglob IV per heme/onc. Pt to receive Crx this week.  Rituxan is the only agent . No real wheeze.  No chest pains.  No excessSaba    Tolerated bactrim well 2/13 and helped sinus disease   PUL ASTHMA HISTORY 06/17/2011 05/11/2011 03/16/2011 08/11/2010  Symptoms 0-2 days/week Throughout the day 0-2 days/week Daily  Nighttime awakenings 3-4/month Often--7/wk 0-2/month 3-4/month  Interference with activity Minor limitations Some limitations No limitations Minor limitations  SABA use 0-2 days/wk Several times/day 0-2 days/wk > 2 days/wk--not > 1 x/day  Exacerbations requiring oral steroids 0-1 / year 2 or more / year 0-1 / year 0-1 / year   05/11/2011 Coughing for one month.  Productive in AM of yellow.  Some mixed with blood over past weekend.  No chest pain, just heavy.  Notes nasal drip yellow.  Severe dry hacky cough.    IV IgG to start in 24hrs. Notes fever at home 101 every afternoon, for one month.    Last Chemorx was 3/13. >>rx IV Cefepime x 7 d, and steroid taper   5/23 Follow up  Returns for 2 week follow up sinusitis. She was treated with 7 days of Cefepime via PICC .   - reports symptoms have improved but still having prod cough with yellow mucus, some wheezing and SOB. No hemoptysis or chest pain  No edema  Still blowing out yellow mucus w/ foul odor.   06/17/2011 Feels much better, still mild foul odor.  No real chest pain.  Only sl heeze, still with cough spells not as bad.  Not as dyspneic.  Pt received total 14 days of IV Cefepime. Pt denies any  significant sore throat, nasal congestion or excess secretions, fever, chills, sweats, unintended weight loss, pleurtic or exertional chest pain, orthopnea PND, or leg swelling Pt denies any increase in rescue therapy over baseline, denies waking up needing it or having any early am or nocturnal exacerbations of coughing/wheezing/or dyspnea. Pt also denies any obvious fluctuation in symptoms with  weather or environmental change or other alleviating or aggravating factors Pt is still on coumadin.  Pt notes weight up 10# in one week   Past Medical History  Diagnosis Date  . Non Hodgkin's lymphoma   . Allergic rhinitis   . GERD (gastroesophageal reflux disease)   . Asthma   . Chronic sinusitis   . Respiratory failure   . Cavitary lung disease      Family History  Problem Relation Age of Onset  . Emphysema Mother   . Allergies Father   . Asthma Father     as a child  . Leukemia Maternal Grandmother   . Diabetes Brother   . Stroke Mother      History   Social History  . Marital Status: Divorced    Spouse Name: N/A    Number of Children: 3  . Years of Education: N/A   Occupational History  . [employer] Service [employer]    Social  History Main Topics  . Smoking status: Never Smoker   . Smokeless tobacco: Never Used  . Alcohol Use: No  . Drug Use: Not on file  . Sexually Active: Not on file   Other Topics Concern  . Not on file   Social History Narrative  . No narrative on file     Allergies  Allergen Reactions  . Meperidine Hcl   . Montelukast Sodium      Outpatient Prescriptions Prior to Visit  Medication Sig Dispense Refill  . ACCU-CHEK AVIVA PLUS test strip as directed.      [name] Kitchen acyclovir (ZOVIRAX) 200 MG capsule 2 capsules twice per day       . albuterol (PROVENTIL) (2.5 MG/3ML) 0.083% nebulizer solution Take 2.5 mg by nebulization every 4 (four) hours as needed.  120 mL  6  . albuterol (VENTOLIN HFA) 108 (90 BASE) MCG/ACT inhaler Inhale 2 puffs into the lungs  every 6 (six) hours as needed.  1 Inhaler  5  . benzonatate (TESSALON) 200 MG capsule Take 1 capsule by mouth Three times daily as needed.      . citalopram (CELEXA) 20 MG tablet Take 20 mg by mouth every morning.        [name] Kitchen EPINEPHrine (EPIPEN IJ) As needed for severe allergic reaction       . furosemide (LASIX) 20 MG tablet Take 1 tablet by mouth as needed.      . Hydrocod Polst-Chlorphen Polst (TUSSICAPS) 10-8 MG CP12 Take 1 capsule by mouth 2 (two) times daily as needed.  20 each  0  . Lancets (ACCU-CHEK MULTICLIX) lancets as directed.      . Mometasone Furo-Formoterol Fum 200-5 MCG/ACT AERO Inhale 2 Inhalers into the lungs 2 (two) times daily.  1 Inhaler  6  . omeprazole (PRILOSEC) 20 MG capsule Take 1 capsule (20 mg total) by mouth 2 (two) times daily.  60 capsule  6  . potassium chloride 40 MEQ/15ML (20%) LIQD 2 tablespoons twice daily       . simvastatin (ZOCOR) 80 MG tablet Take 80 mg by mouth at bedtime.        [name] Kitchen warfarin (COUMADIN) 10 MG tablet Take by mouth daily. Currently taking 6 mg alternation with 7mg       . zafirlukast (ACCOLATE) 20 MG tablet Take 1 tablet (20 mg total) by mouth 2 (two) times daily.  60 tablet  5  . predniSONE (DELTASONE) 10 MG tablet Take 4 for four days 3 for four days 2 for four days 1 daily for four days then 1/2 daily and stay  40 tablet  6  . ceFEPIme (MAXIPIME) 1 GM/50ML SOLN Inject 50 mLs (1 g total) into the vein every 12 (twelve) hours.  700 mL  0   Facility-Administered Medications Prior to Visit  Medication Dose Route Frequency Provider Last Rate Last Dose  . 0.9 %  sodium chloride infusion   Intravenous Continuous Randall An, MD 20 mL/hr at 05/12/11 1610       Review of Systems  Constitutional:   No  weight loss, night sweats,  Fevers, chills, fatigue, lassitude. HEENT:   Notes  headaches,  Difficulty swallowing,  Tooth/dental problems,  Sore throat,                No sneezing, itching, ear ache, nasal congestion, +++ post nasal drip,    CV:  No chest pain,  Orthopnea, PND, swelling in lower extremities, anasarca, dizziness, palpitations  GI  No heartburn, indigestion,  abdominal pain, nausea, vomiting, diarrhea, change in bowel habits, loss of appetite  Resp: Notes  shortness of breath with exertion and  at rest.  Notes  excess mucus, notes  productive cough,  No non-productive cough,  No coughing up of blood.  Notes  change in color of mucus.  No wheezing.  No chest wall deformity  Skin: no rash or lesions.  GU: no dysuria, change in color of urine, no urgency or frequency.  No flank pain.  MS:  No joint pain or swelling.  No decreased range of motion.  No back pain.  Psych:  No change in mood or affect. No depression or anxiety.  No memory loss.     Objective:   Physical Exam  Filed Vitals:   06/17/11 1522  BP: 116/74  Pulse: 63  Temp: 98.4 F (36.9 C)  TempSrc: Oral  Height: 5\' 2"  (1.575 m)  Weight: 178 lb 9.6 oz (81.012 kg)  SpO2: 96%    Gen: Pleasant, well-nourished, in no distress,  normal affect  ENT: No lesions,  mouth clear,  oropharynx clear,  Mild nasal redness , R nasal purulence  Neck: No JVD, no TMG, no carotid bruits  Lungs: No use of accessory muscles, no dullness to percussion, coarse BS w/ few rhonchi   Cardiovascular: RRR, heart sounds normal, no murmur or gallops, no peripheral edema  Abdomen: soft and NT, no HSM,  BS normal  Musculoskeletal: No deformities, no cyanosis or clubbing  Neuro: alert, non focal  Skin: Warm, no lesions or rashes      Assessment & Plan:   SINUSITIS, CHRONIC NOS Persistent acute on chronic sinusitis with previous Pseudomonas colonization Severe persistent asthma driven by chronic sinusitis Plan Fortaz 1 g IV twice daily for 7 days via PICC line Maintain nasal hygiene   Extrinsic asthma, unspecified Severe persistent asthma driven by chronic and acute sinusitis Plan Review sinusitis assess    Updated Medication List Outpatient  Encounter Prescriptions as of 06/17/2011  Medication Sig Dispense Refill  . ACCU-CHEK AVIVA PLUS test strip as directed.      [name] Kitchen acyclovir (ZOVIRAX) 200 MG capsule 2 capsules twice per day       . albuterol (PROVENTIL) (2.5 MG/3ML) 0.083% nebulizer solution Take 2.5 mg by nebulization every 4 (four) hours as needed.  120 mL  6  . albuterol (VENTOLIN HFA) 108 (90 BASE) MCG/ACT inhaler Inhale 2 puffs into the lungs every 6 (six) hours as needed.  1 Inhaler  5  . benzonatate (TESSALON) 200 MG capsule Take 1 capsule by mouth Three times daily as needed.      . citalopram (CELEXA) 20 MG tablet Take 20 mg by mouth every morning.        [name] Kitchen EPINEPHrine (EPIPEN IJ) As needed for severe allergic reaction       . furosemide (LASIX) 20 MG tablet Take 1 tablet by mouth as needed.      . Hydrocod Polst-Chlorphen Polst (TUSSICAPS) 10-8 MG CP12 Take 1 capsule by mouth 2 (two) times daily as needed.  20 each  0  . Lancets (ACCU-CHEK MULTICLIX) lancets as directed.      . Mometasone Furo-Formoterol Fum 200-5 MCG/ACT AERO Inhale 2 Inhalers into the lungs 2 (two) times daily.  1 Inhaler  6  . omeprazole (PRILOSEC) 20 MG capsule Take 1 capsule (20 mg total) by mouth 2 (two) times daily.  60 capsule  6  . potassium chloride 40 MEQ/15ML (20%) LIQD 2 tablespoons twice daily       .  predniSONE (DELTASONE) 10 MG tablet Take 5 mg by mouth daily.      . simvastatin (ZOCOR) 80 MG tablet Take 80 mg by mouth at bedtime.        [name] Kitchen warfarin (COUMADIN) 10 MG tablet Take by mouth daily. Currently taking 6 mg alternation with 7mg       . zafirlukast (ACCOLATE) 20 MG tablet Take 1 tablet (20 mg total) by mouth 2 (two) times daily.  60 tablet  5  . DISCONTD: predniSONE (DELTASONE) 10 MG tablet Take 4 for four days 3 for four days 2 for four days 1 daily for four days then 1/2 daily and stay  40 tablet  6  . cefTAZidime (FORTAZ) 1 G injection Inject 1 g into the muscle every 12 (twelve) hours.  14 each  0  . DISCONTD: ceFEPIme (MAXIPIME)  1 GM/50ML SOLN Inject 50 mLs (1 g total) into the vein every 12 (twelve) hours.  700 mL  0   Facility-Administered Encounter Medications as of 06/17/2011  Medication Dose Route Frequency Provider Last Rate Last Dose  . 0.9 %  sodium chloride infusion   Intravenous Continuous Randall An, MD 20 mL/hr at 05/12/11 [phone]

## 2011-06-17 NOTE — Assessment & Plan Note (Signed)
Severe persistent asthma driven by chronic and acute sinusitis Plan Review sinusitis assess

## 2011-06-17 NOTE — Assessment & Plan Note (Signed)
Persistent acute on chronic sinusitis with previous Pseudomonas colonization Severe persistent asthma driven by chronic sinusitis Plan Fortaz 1 g IV twice daily for 7 days via PICC line Maintain nasal hygiene

## 2011-06-17 NOTE — Patient Instructions (Signed)
Resume IV antibiotics Elita Quick 1gram twice daily for 7days  Will use [facility] Return f/u with tammy parrett after completed

## 2011-06-20 ENCOUNTER — Telehealth: Payer: Self-pay | Admitting: Critical Care Medicine

## 2011-06-20 NOTE — Telephone Encounter (Signed)
That was my error Should have been IV

## 2011-06-20 NOTE — Telephone Encounter (Signed)
Spoke with pt. She states nothing needed now. [facility] is delivering her med tonight.

## 2011-06-20 NOTE — Telephone Encounter (Signed)
Received phone call from [facility] with [facility] pharmacy.  Order was sent for IV fortaz, however the rx was for fortaz IM injections.  Given that the patient instructions, ov note, and order all state IV fortaz 1g twice daily x7 days the verbal authorization for this was given to [facility].  Will sign off and forward to [facility] as FYI.

## 2011-06-23 IMAGING — CR DG CHEST 1V PORT SAME DAY
1 series · 1 of 1 positions shown · non-contrast
Comparison: Chest radiograph and [DATE] and [DATE] hours.

CLINICAL DATA: Shortness of breath.

PORTABLE CHEST - 1 VIEW SAME DAY

[view not recorded]
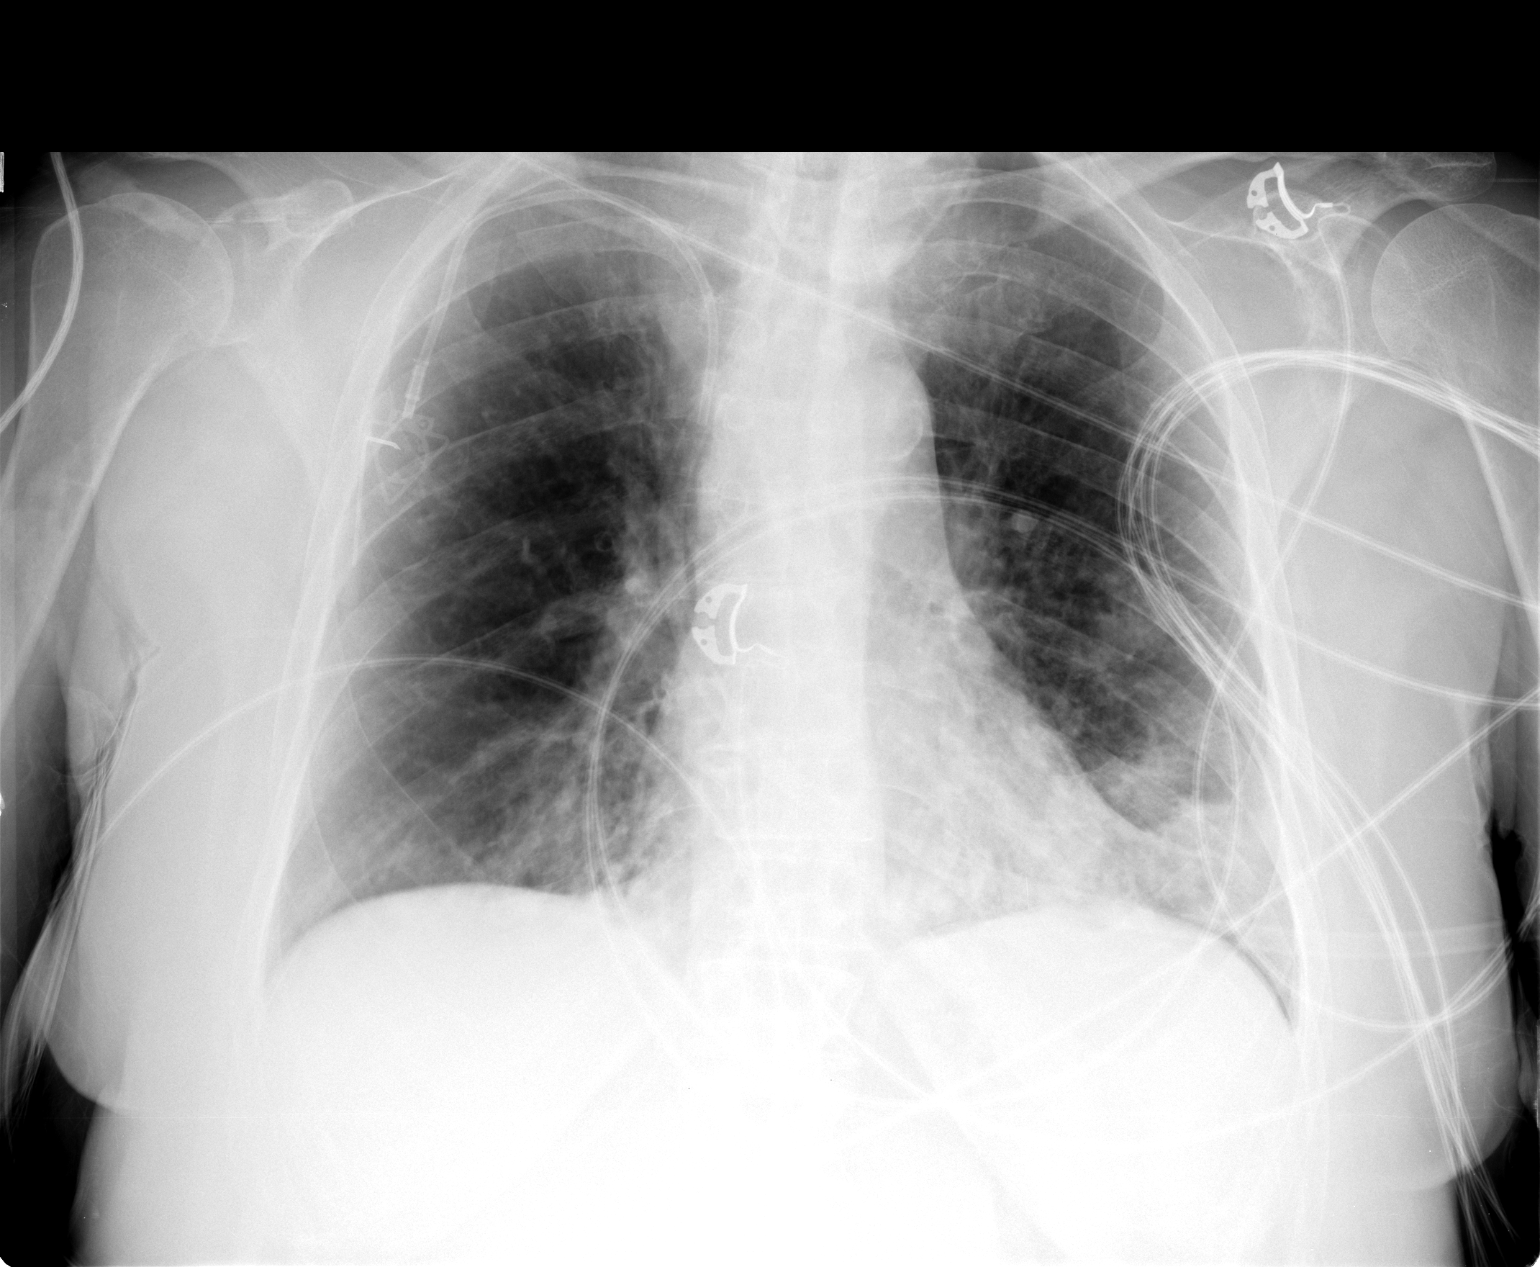

[1 of 1 positions shown; findings below may reference images not displayed]

FINDINGS: Right-sided Port-A-Cath is in stable position, with tip in the
superior vena cava.

Heart and mediastinal contours are normal.

There is peribronchial thickening and patchy airspace disease at
the lung bases bilaterally, left greater than right.  Aeration of
the left lung  base appears decreased compared to the chest
radiograph performed earlier today.  No pleural effusion or
pneumothorax.  Cardiac leads and support devices project over the
chest.
IMPRESSION: Patchy bibasilar opacities.  Findings suspicious for infection or
aspiration.  Aeration of the left lung base is decreased compared
to the chest x-ray earlier today.

## 2011-06-23 IMAGING — CR DG CHEST 1V PORT
1 series · 1 of 1 positions shown · non-contrast
Comparison: Earlier same day.

CLINICAL DATA: Endotracheal tube and central line placement.
Sepsis.

PORTABLE CHEST - 1 VIEW

[view not recorded]
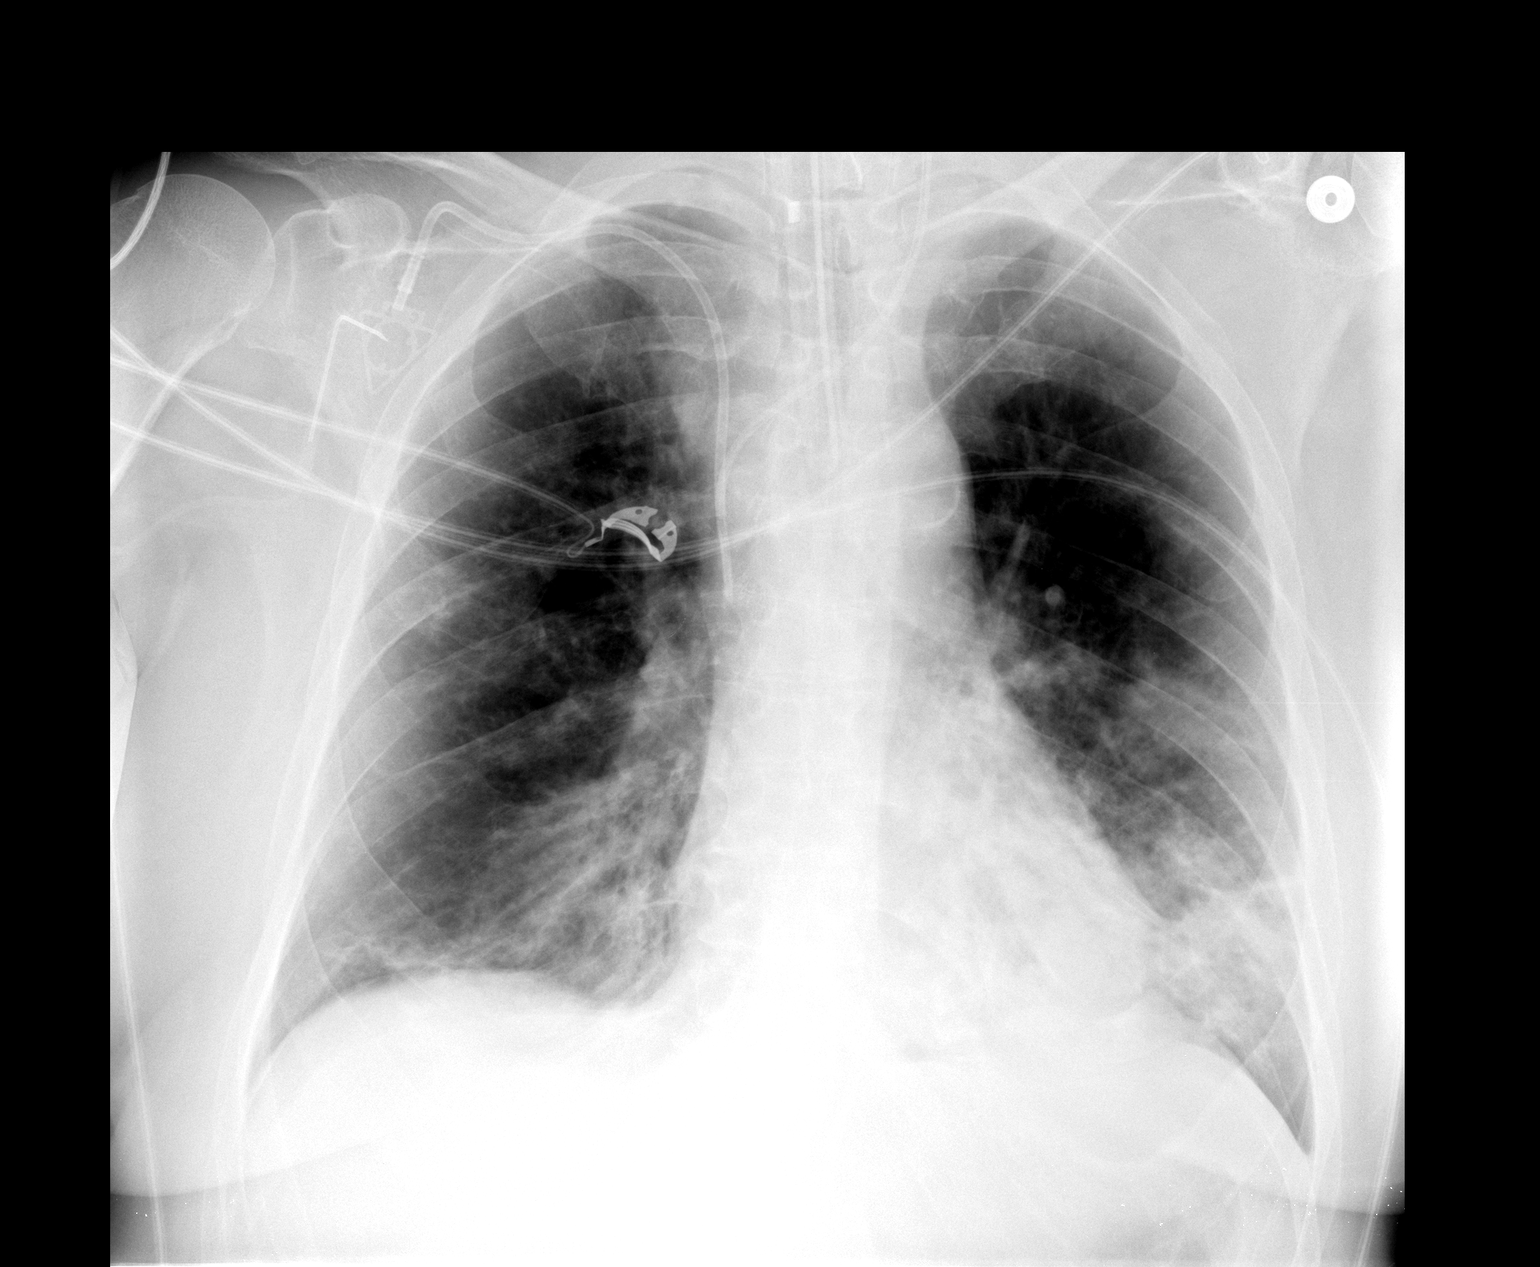

[1 of 1 positions shown; findings below may reference images not displayed]

FINDINGS: Endotracheal tube has its tip 3 cm above the carina.
Right-sided Port-A-Cath remains in place.  Left internal jugular
central line has its tip in the SVC at the azygos level.  There is
persistent and slight worsening patchy infiltrate at both lung
bases, left worse than right.  No evidence of heart failure or
effusion.
IMPRESSION: Lines and tubes well positioned.  Slight worsening of patchy
infiltrate at the lung bases, left worse than right.

## 2011-06-23 IMAGING — CR DG CHEST 1V PORT
1 series · 1 of 1 positions shown · non-contrast
Comparison: 09/28/2009 and earlier.

CLINICAL DATA: 54-year-old female with shortness of breath, fever,
non-Hodgkins lymphoma.

PORTABLE CHEST - 1 VIEW

[view not recorded]
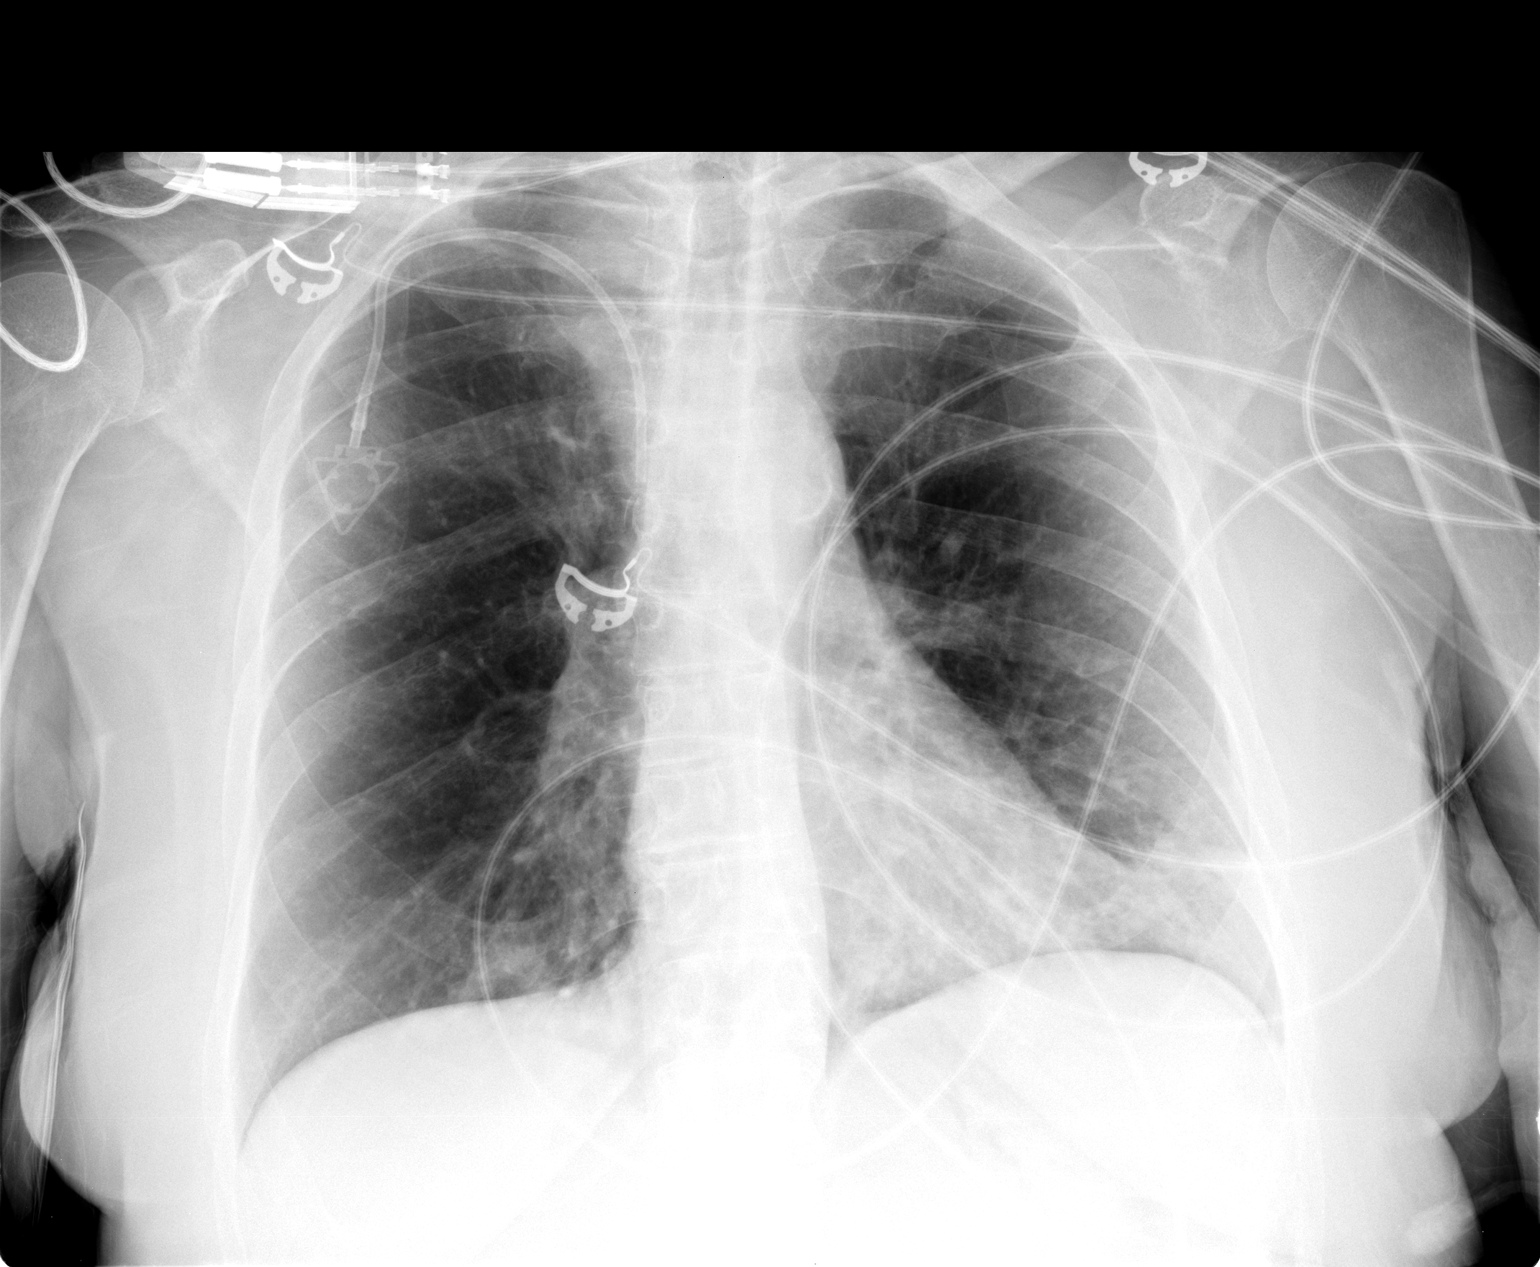

[1 of 1 positions shown; findings below may reference images not displayed]

FINDINGS: Portable upright AP view at 3900 hours.  Stable right
chest Port-A-Cath.  Cardiac size and mediastinal contours are
within normal limits.  Visualized tracheal air column is within
normal limits.  Lower cervical fusion hardware partially
visualized.  Minimally increased streaky bilateral infrahilar
opacity.  Otherwise stable lung parenchyma.  No pneumothorax,
pulmonary edema or pleural effusion.
IMPRESSION: Minimally increased bilateral infrahilar opacity could reflect
atelectasis or early infection in this setting.  Recommend PA and
lateral views of the chest when possible.

## 2011-06-24 IMAGING — US US RENAL PORT
1 series · 13 of 25 positions shown · non-contrast
Comparison: Abdominal CT 07/06/2009.

CLINICAL DATA: Renal failure.  History of lymphoma and left renal
mass status post biopsy 3 months ago.

RENAL/URINARY TRACT ULTRASOUND COMPLETE- portable

[Series 1: us renal port · 0.31mm/px · 13 of 50 slices shown]
[im 1/50]
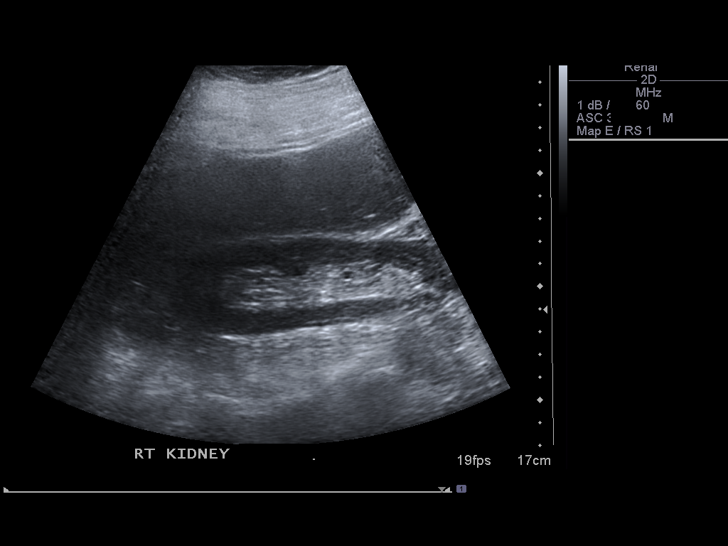
[im 5/50]
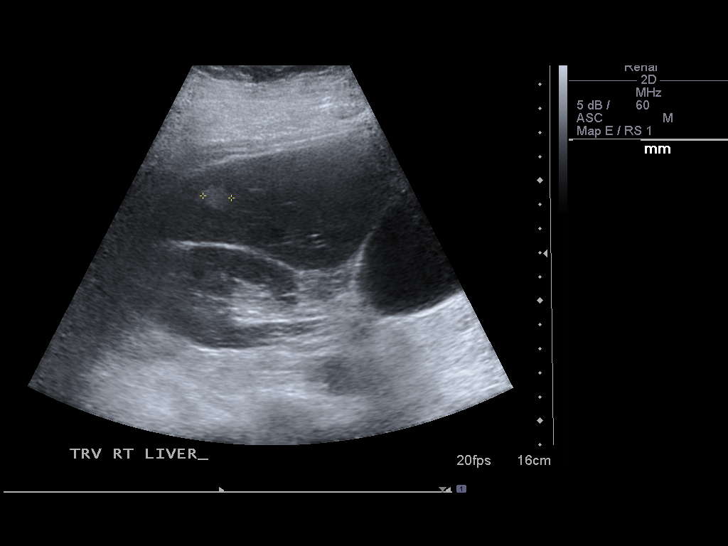
[im 9/50]
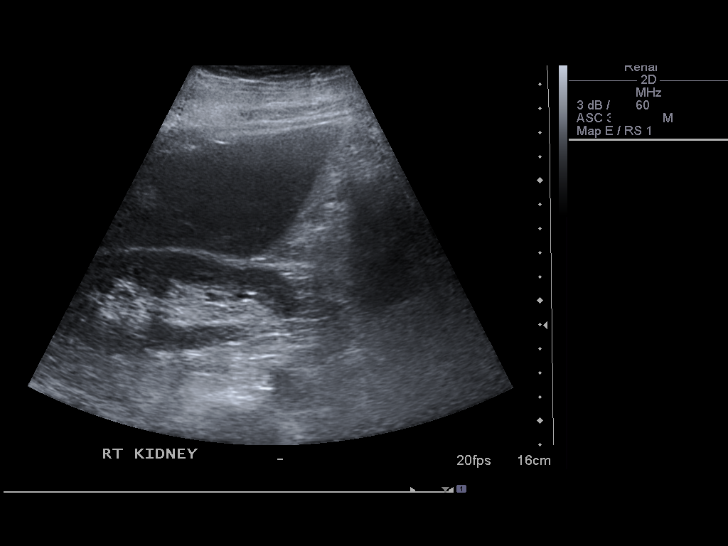
[im 13/50]
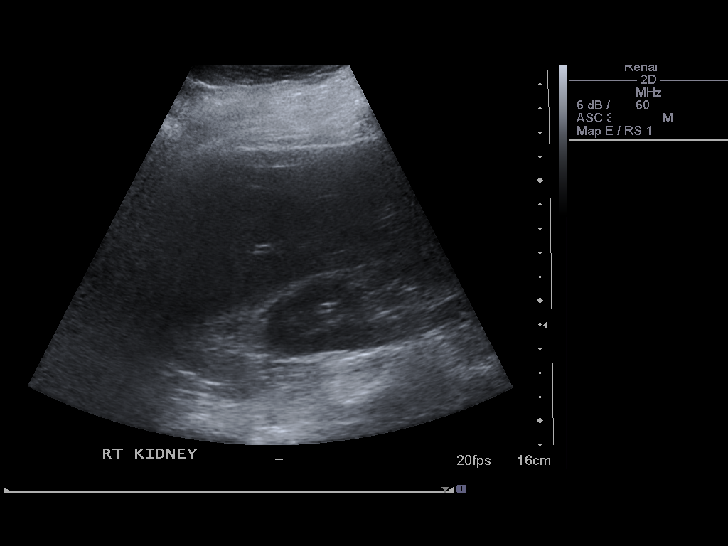
[im 17/50]
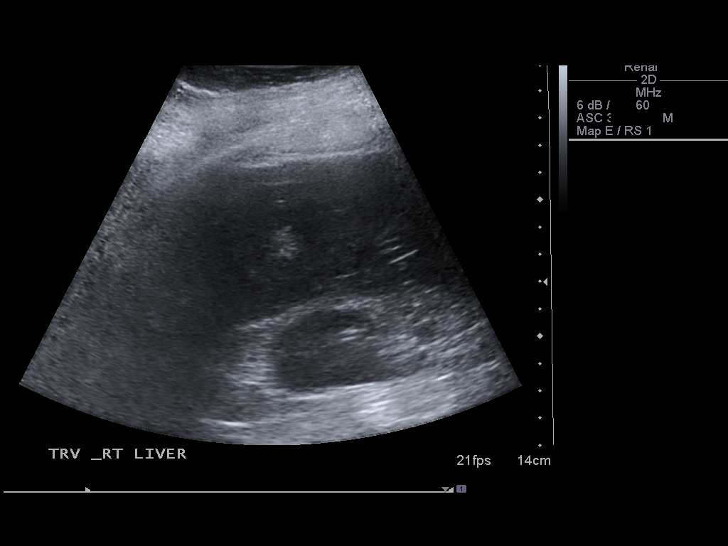
[im 21/50]
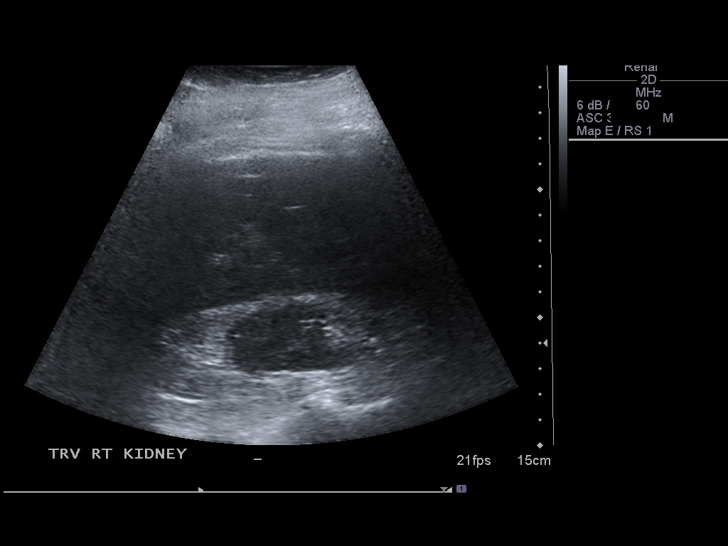
[im 25/50]
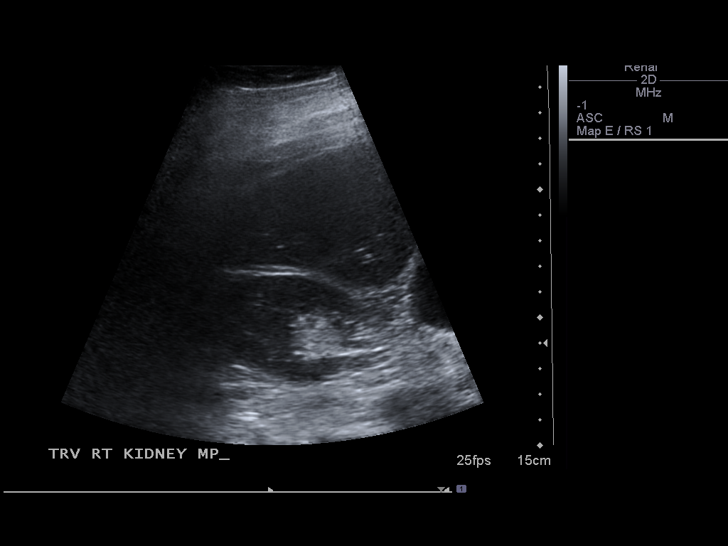
[im 29/50]
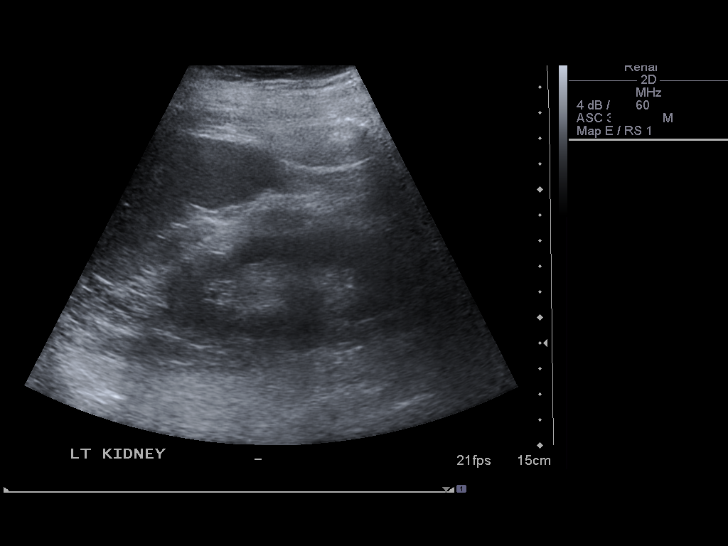
[im 33/50]
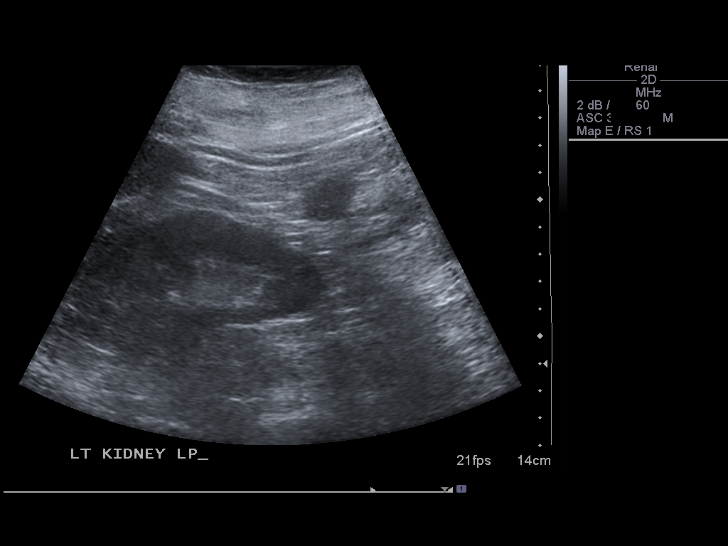
[im 37/50]
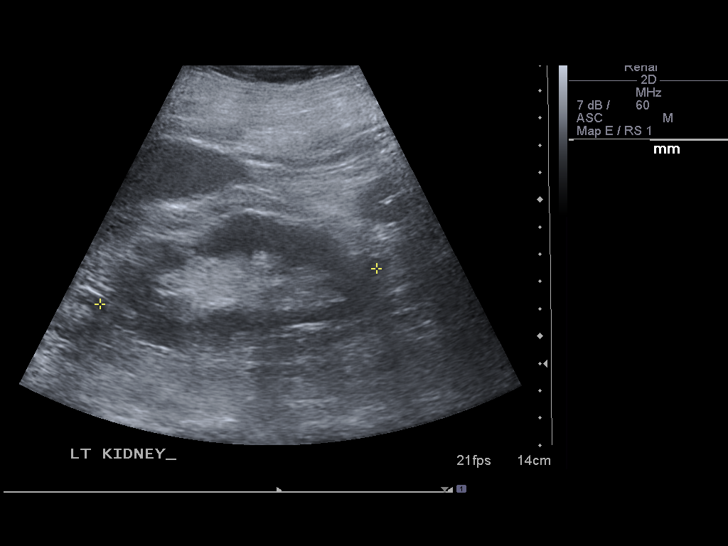
[im 41/50]
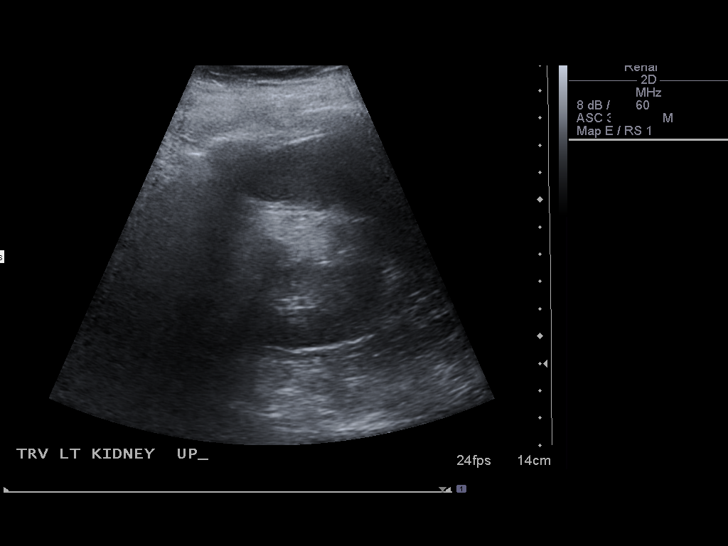
[im 45/50]
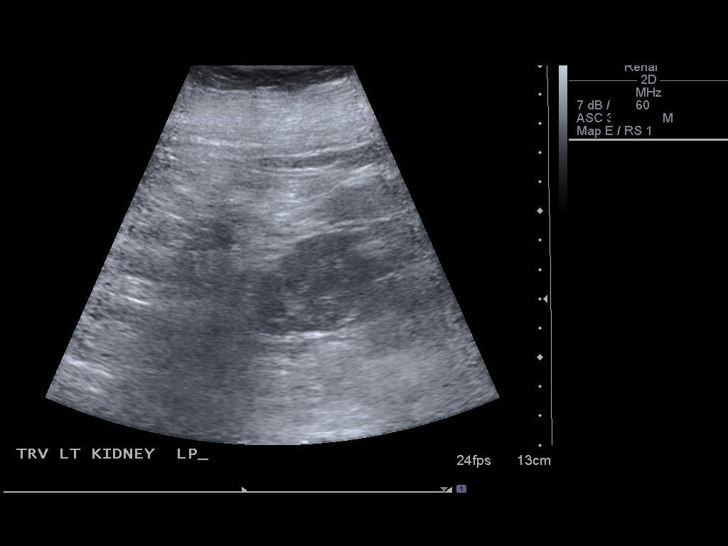
[im 50/50]
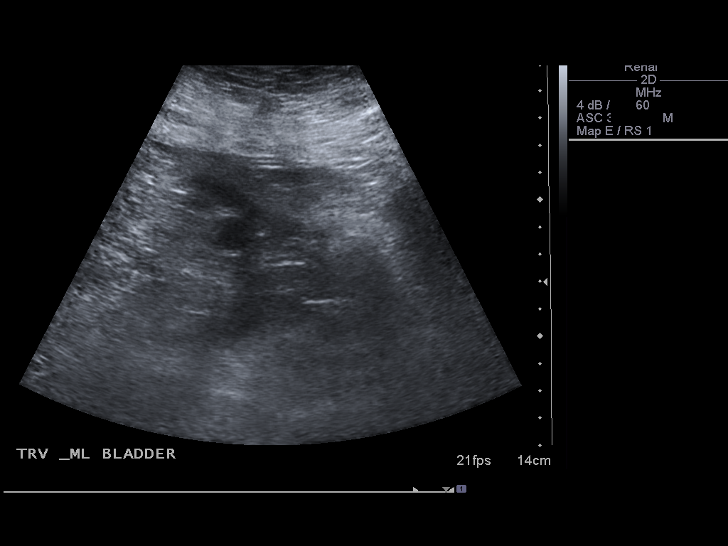

[13 of 25 positions shown; findings below may reference images not displayed]

FINDINGS: Right Kidney:  The right kidney measures 13.8 cm in length.  There
is no hydronephrosis.  There is a hypoechoic area in the upper pole
which is not well visualized, but measures approximately 4 cm in
diameter.  This may correspond with the mass demonstrated on prior
CT.

Left Kidney:  The left kidney measures 10.2 cm in length.  There is
no hydronephrosis.  There was a large mass nearly replacing the
left kidney on the prior CT which is no longer visualized.

Bladder:  Decompressed by Foley catheter and not visualized.

Incidentally noted are two echogenic liver lesions measuring up to
1.8 and 1.5 cm in diameter.  These were demonstrated on the prior
examination and may represent hemangiomas.  There are not
completely evaluated by this examination.
IMPRESSION: 1.  No hydronephrosis.
2.  The large mass replacing the left kidney on prior CT is not
visualized.  There is a questionable residual hypoechoic lesion in
the upper pole of the right kidney.
3.  Two echogenic liver lesions appears similar in size to prior CT
and may represent hemangiomas.

## 2011-06-25 IMAGING — CR DG CHEST 1V PORT
1 series · 1 of 1 positions shown · non-contrast
Comparison: Portable chest x-rays yesterday and dating back to
10/31/2009.

CLINICAL DATA: Lymphoma.  Sepsis.  Renal failure.  Ventilator
dependent respiratory failure.

PORTABLE CHEST - 1 VIEW [DATE]/[id number] hours:

[AP]
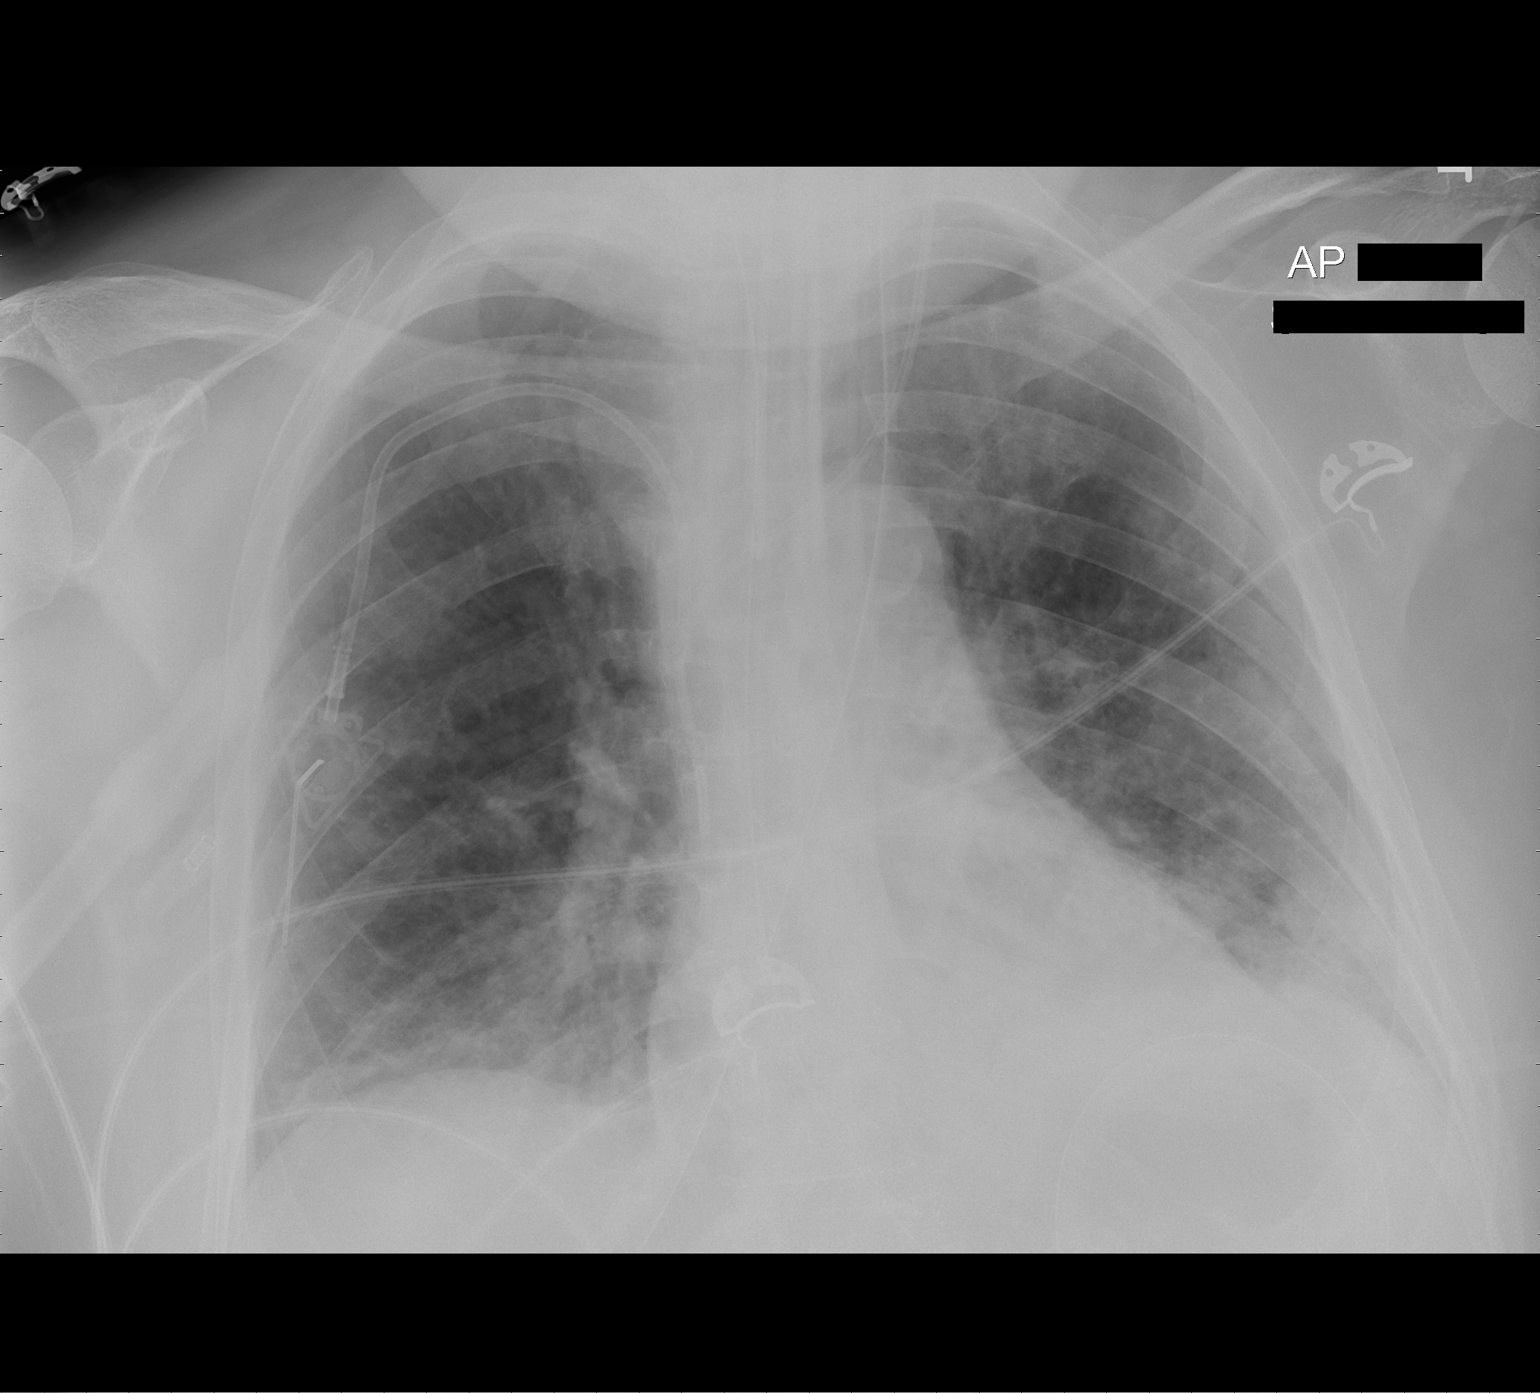

[1 of 1 positions shown; findings below may reference images not displayed]

FINDINGS: Endotracheal tube tip in satisfactory position
approximately 3 cm above carina.  Left jugular central venous
catheter tip in the SVC.  Right subclavian Port-A-Cath tip in the
upper SVC.  Even allowing for differences in technique, worsening
patchy airspace opacities throughout both lungs, most confluent in
the bases.  Possible small bilateral pleural effusions.
IMPRESSION: Worsening patchy pneumonia throughout both lungs, most confluent in
the lung bases.  Support apparatus satisfactory.

## 2011-06-27 IMAGING — CR DG CHEST 1V PORT
1 series · 1 of 1 positions shown · non-contrast
Comparison: 11/03/2009

CLINICAL DATA: Sepsis, renal failure.

PORTABLE CHEST - 1 VIEW

[view not recorded]
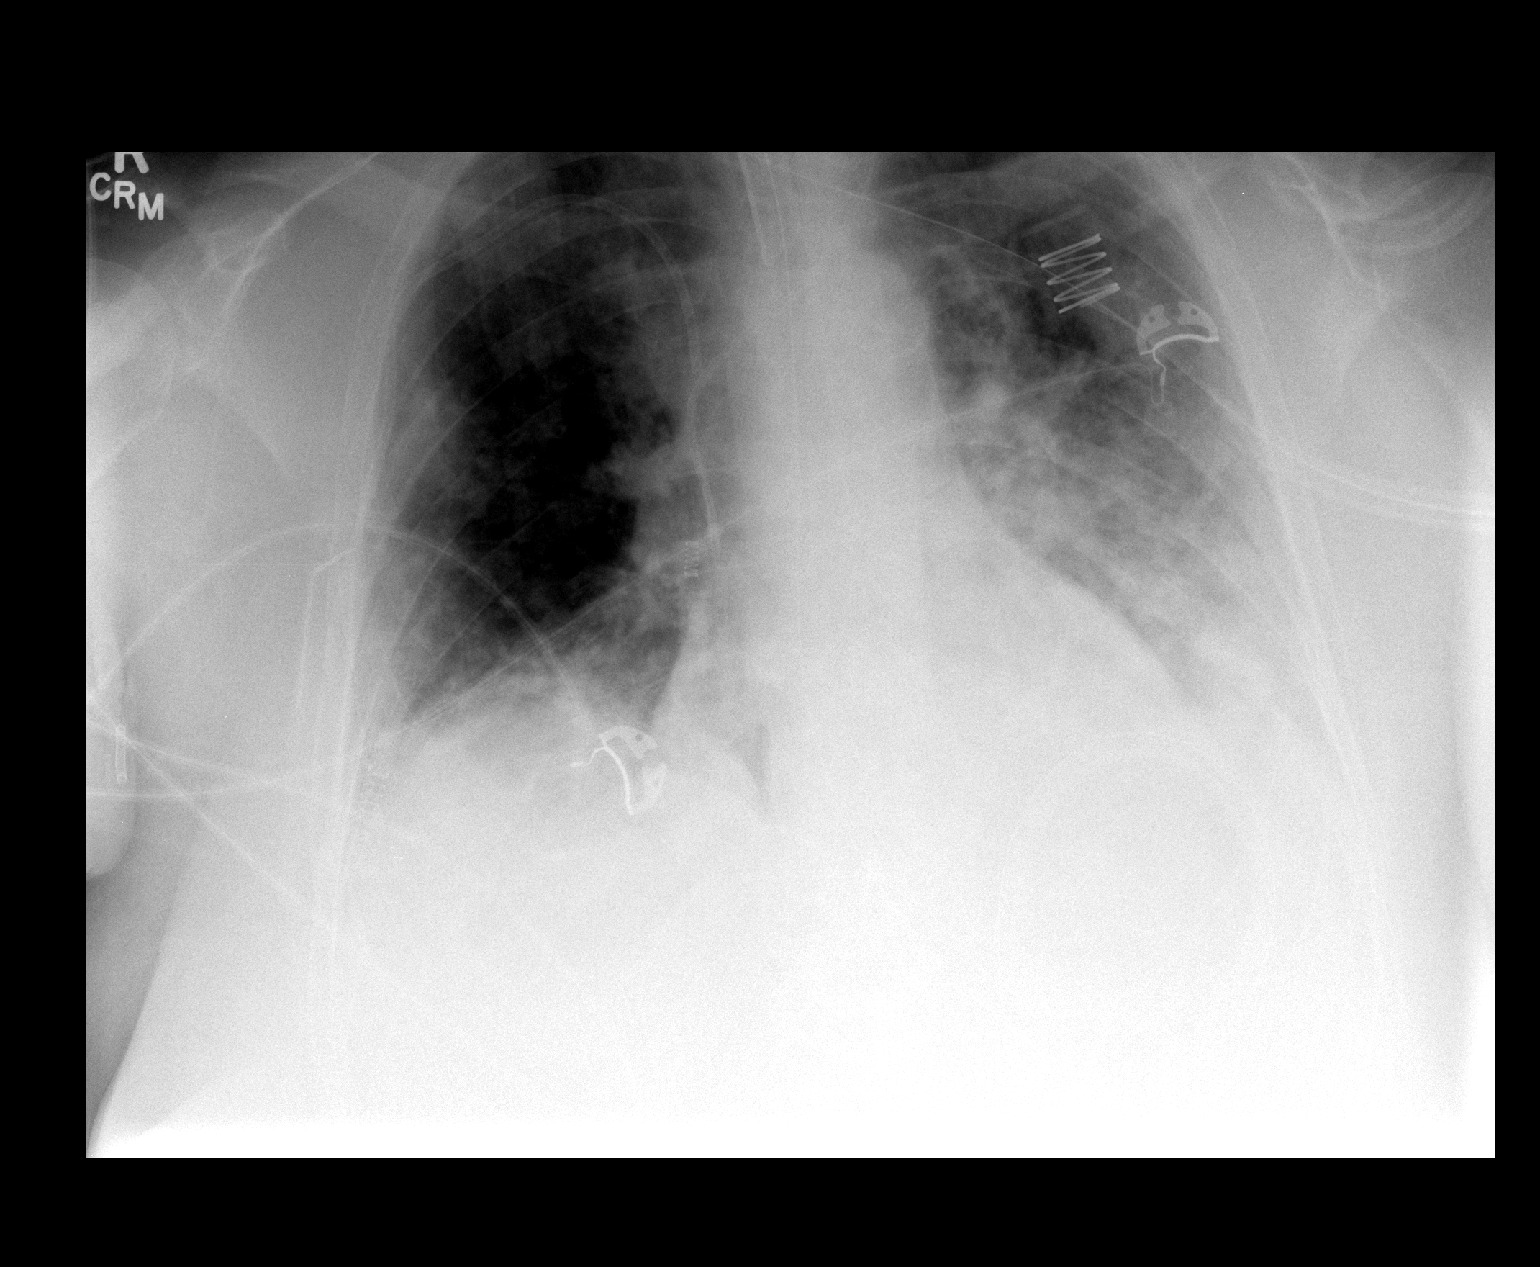

[1 of 1 positions shown; findings below may reference images not displayed]

FINDINGS: Support devices are stable.  Bilateral airspace disease
noted, left greater than right.  This is essentially stable when
compared to prior study.  Heart is borderline in size.
IMPRESSION: No significant change bilateral airspace disease, left greater than
right.

## 2011-06-28 IMAGING — CR DG CHEST 1V PORT
1 series · 1 of 1 positions shown · non-contrast
Comparison: 11/04/2009

CLINICAL DATA: Sepsis, renal failure.

PORTABLE CHEST - 1 VIEW

[view not recorded]
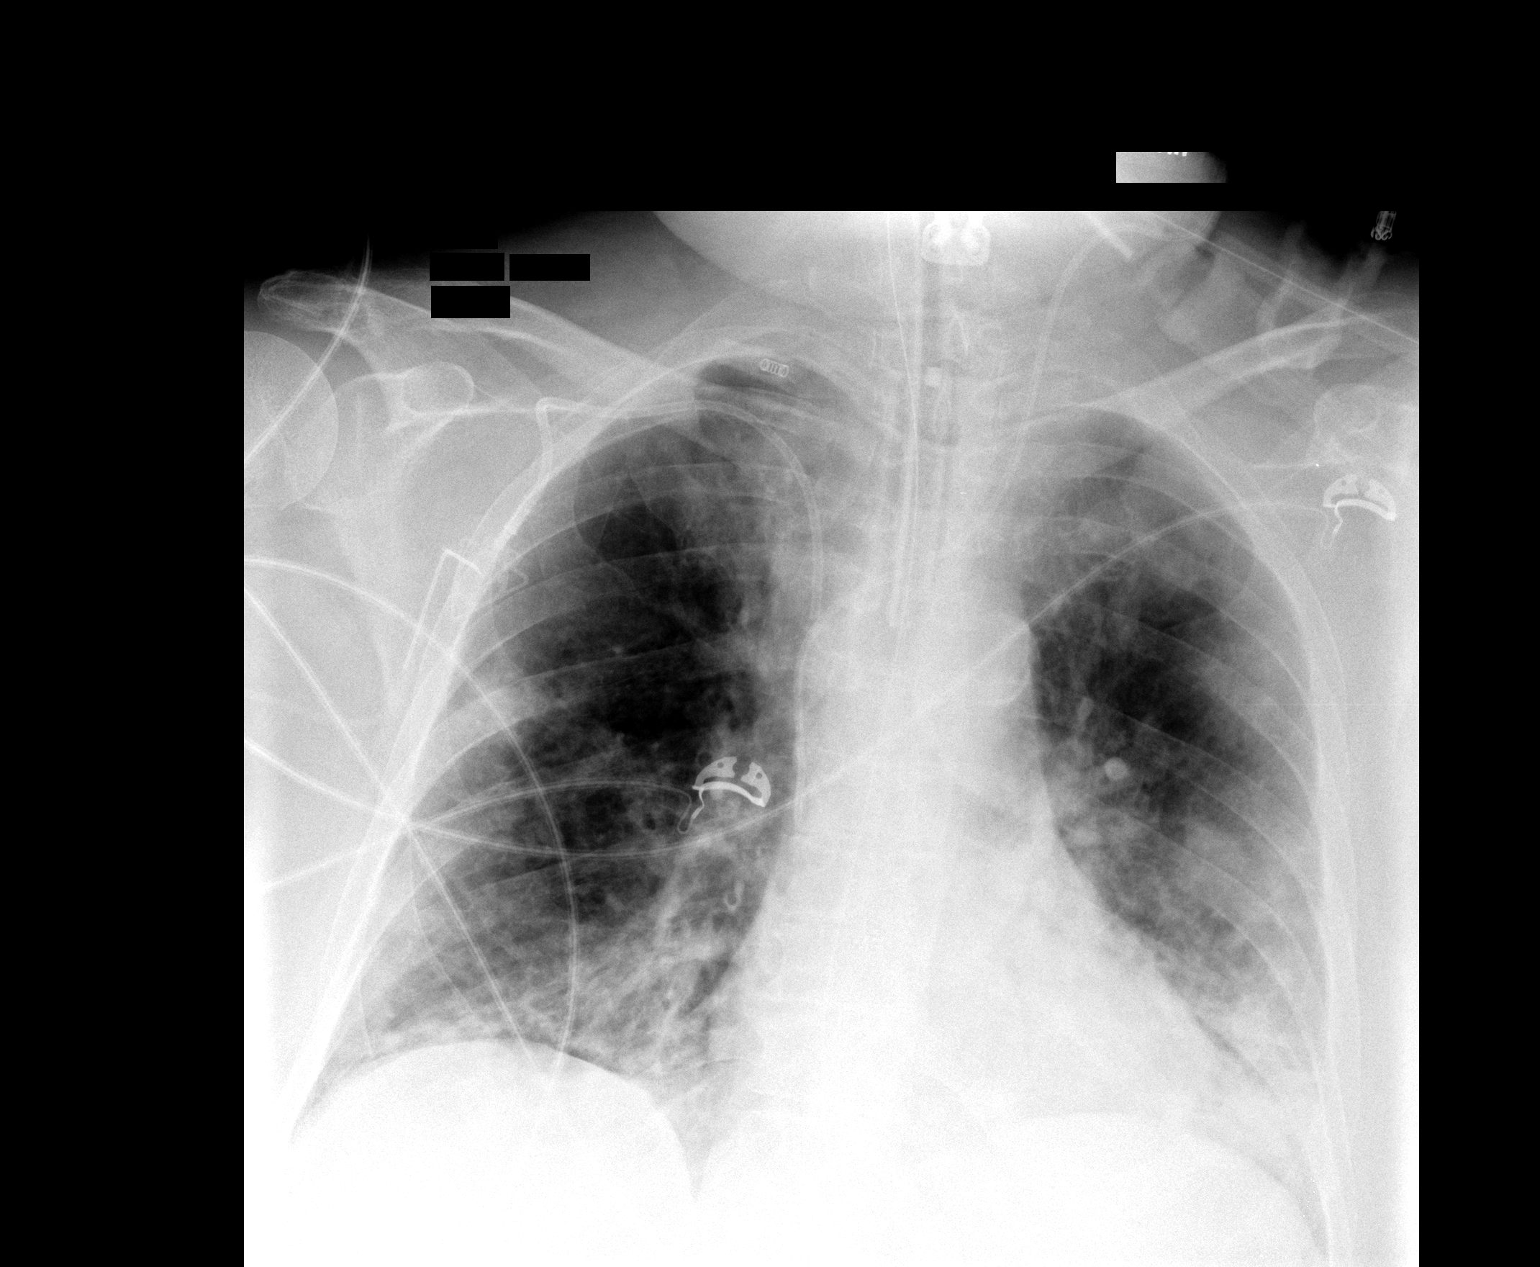

[1 of 1 positions shown; findings below may reference images not displayed]

FINDINGS: Support devices are stable.  Bilateral bilateral airspace
opacities again noted, possibly slightly improved in the lung bases
increasing volumes.  No effusions.  Heart is normal size.
IMPRESSION: Patchy bilateral airspace disease with slight improving aeration.

## 2011-06-29 IMAGING — CT CT MAXILLOFACIAL W/O CM
3 of 5 series · 16 of 47 positions shown, 19 images · non-contrast
Comparison: MRI brain 09/03/2009, head CT/sinus CT 08/12/2009

CT HEAD

CLINICAL DATA: Fever, sepsis, altered mental status.  Chronic
sinusitis.  Prior nasal surgery.

CT HEAD WITHOUT CONTRAST
CT MAXILLOFACIAL WITHOUT CONTRAST
TECHNIQUE: Multidetector CT imaging of the head and maxillofacial
structures were performed using the standard protocol without
intravenous contrast. Multiplanar CT image reconstructions of the
maxillofacial structures were also generated.

[Series 4: head trauma 2.4 h60s · axial · 0.45mm/px · z∈[-64,+81]mm · 10 of 72 slices shown, 13 images]
[im 6/72  brain]
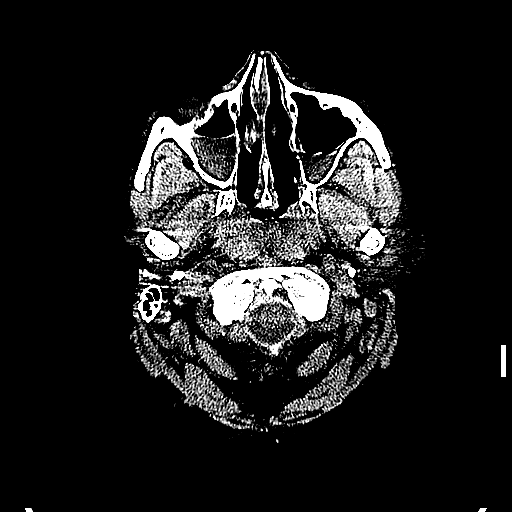
[im 6/72  bone]
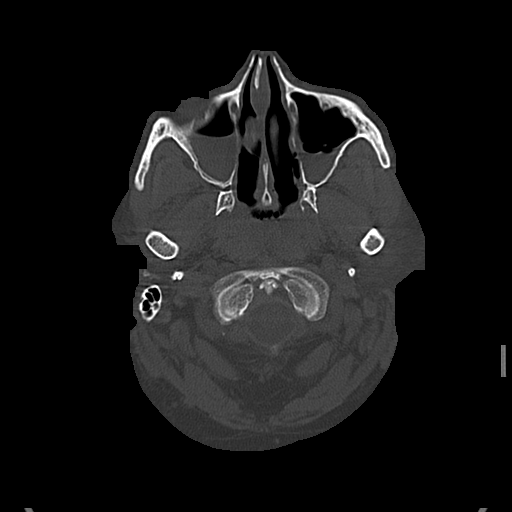
[im 12/72  bone]
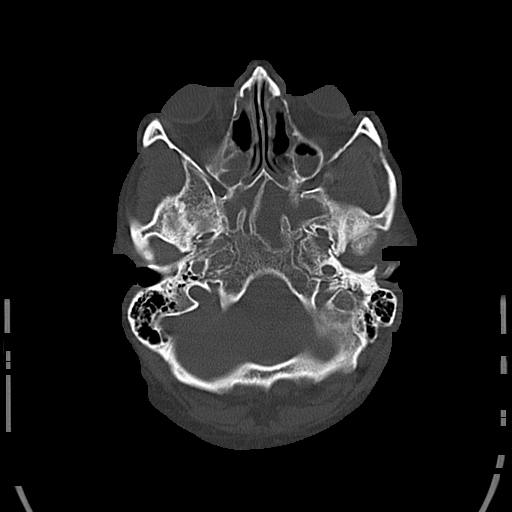
[im 18/72  bone]
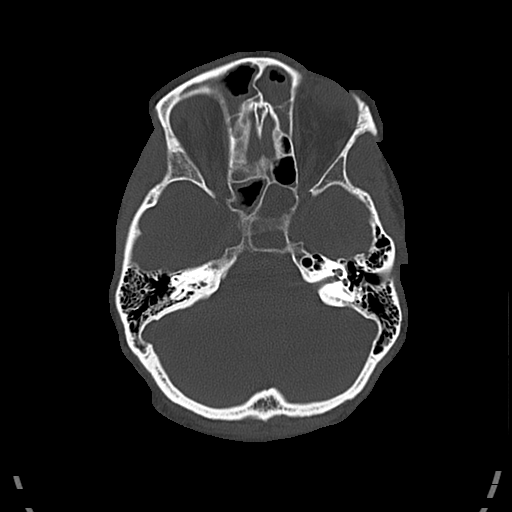
[im 24/72  bone]
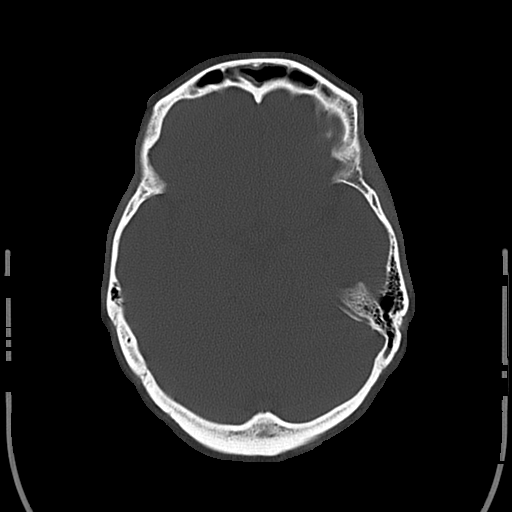
[im 30/72  brain]
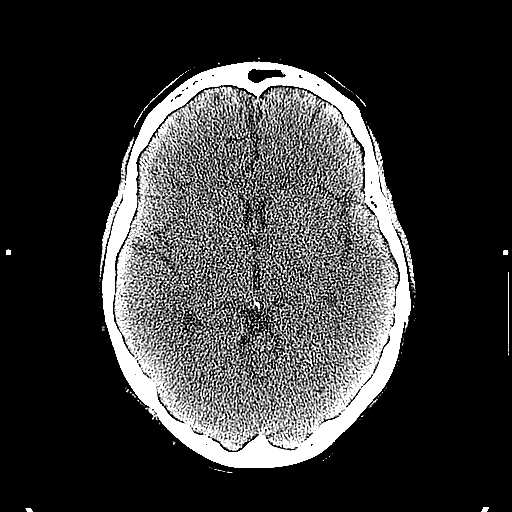
[im 30/72  bone]
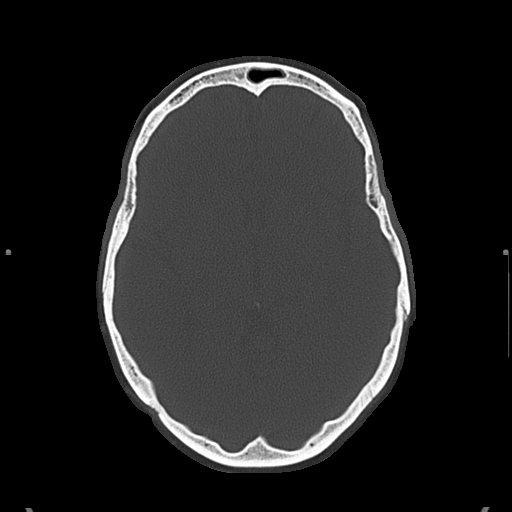
[im 42/72  bone]
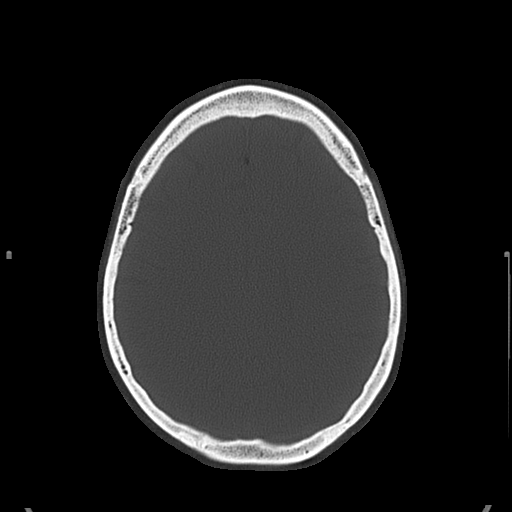
[im 48/72  bone]
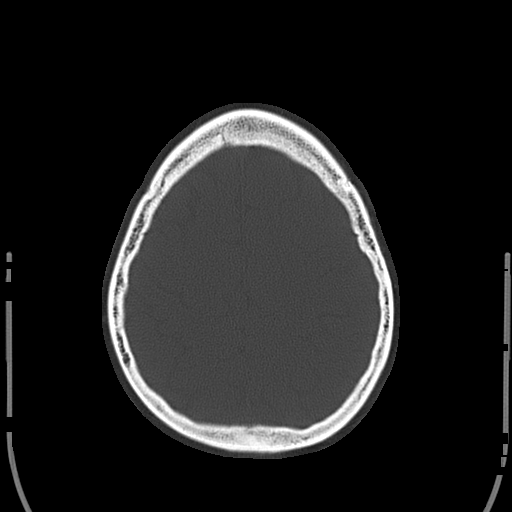
[im 54/72  bone]
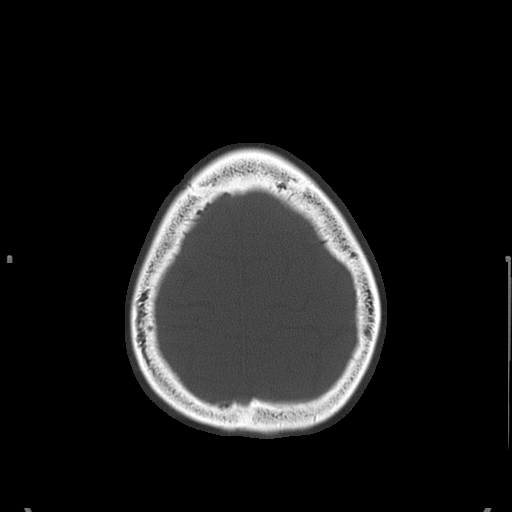
[im 60/72  brain]
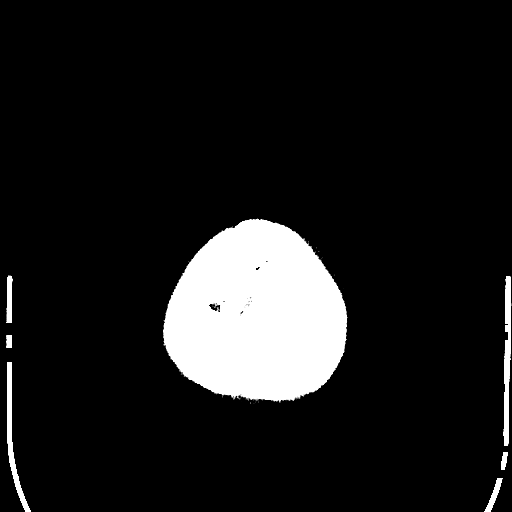
[im 60/72  bone]
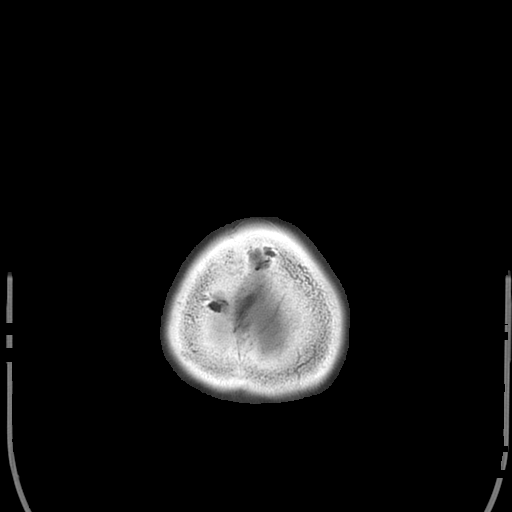
[im 66/72  bone]
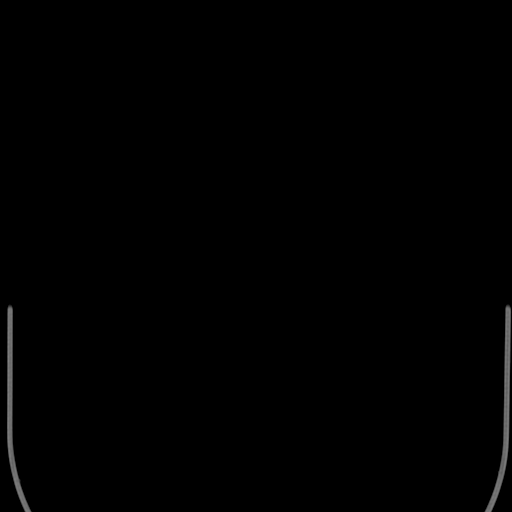

[Series 11: facial 2.0 spo cor st · coronal · 0.41mm/px · 3 of 73 slices shown]
[im 25/73  bone]
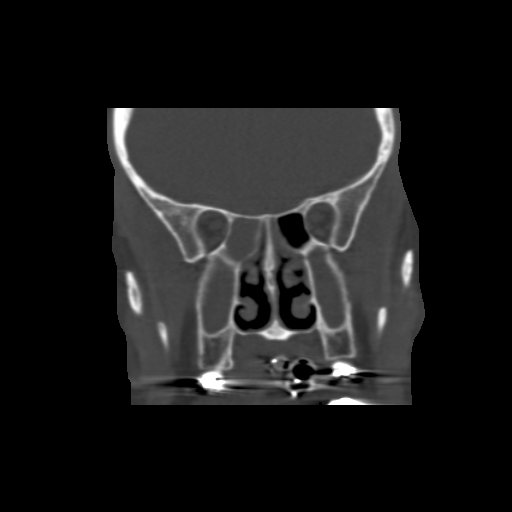
[im 33/73  bone]
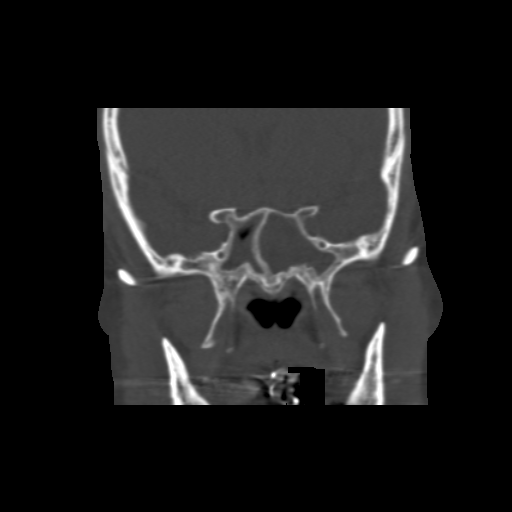
[im 41/73  bone]
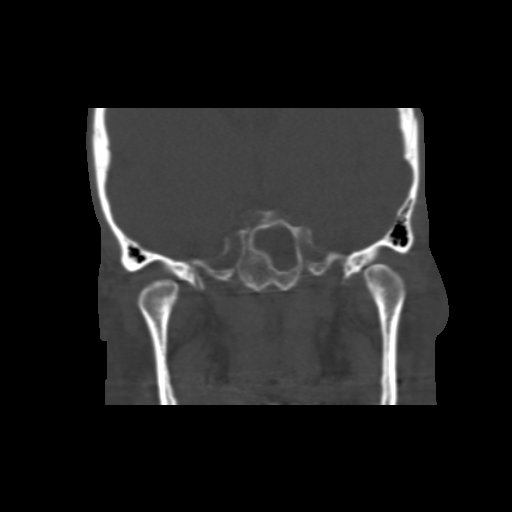

[Series 12: facial 2.0 spo sag st · sagittal · 0.41mm/px · 3 of 73 slices shown]
[im 25/73  bone]
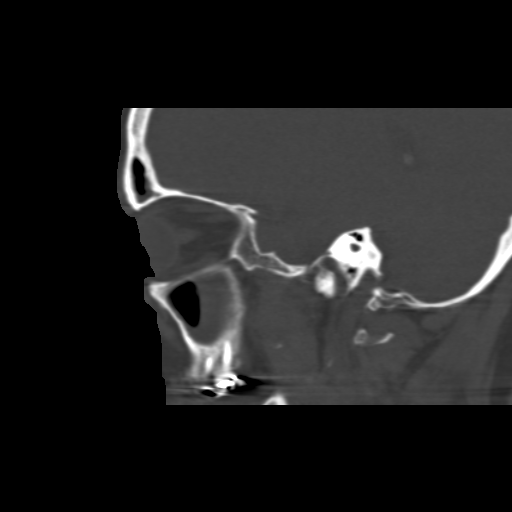
[im 37/73  bone]
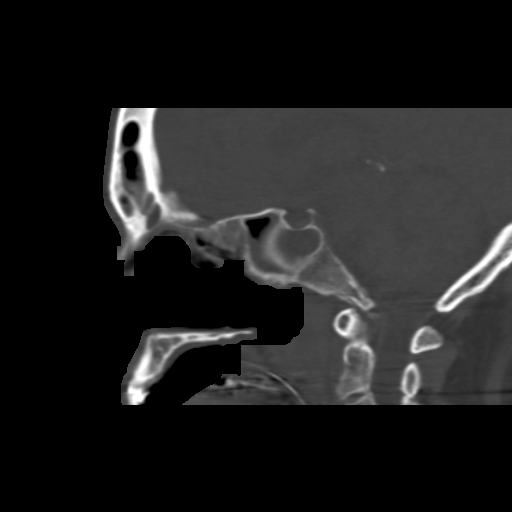
[im 49/73  bone]
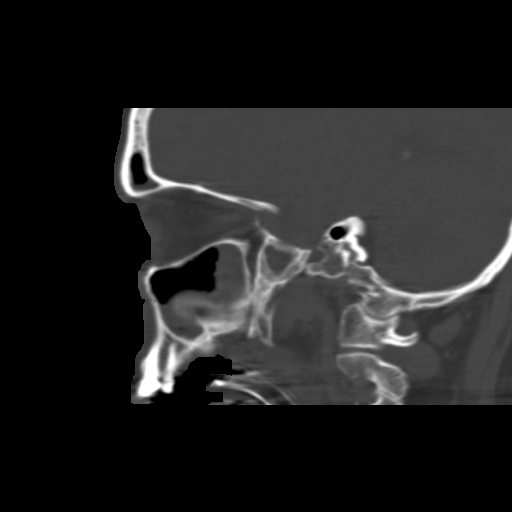

[16 of 47 positions shown; findings below may reference images not displayed]

FINDINGS: Findings pertaining to the paranasal sinuses will be
dictated under the dedicated report below.  No skull fracture.  No
acute hemorrhage, acute infarction, or mass lesion is seen.  No
midline shift.  Ventricles are normal in size.
IMPRESSION: No acute intracranial finding.  Findings pertaining to the
maxillary sinuses described below in dedicated report dictated
separately.

CT MAXILLOFACIAL
FINDINGS: Interval increase in extent of bilateral maxillary
sinus partial opacification with air fluid levels again noted.
Evidence of prior antrectomy noted.  Near complete opacification of
the sphenoid and frontal sinuses noted, with diffuse mucoperiosteal
thickening.  There is minimal motion artifact through the level of
the maxillary sinuses, but there appears to be minimal cortical
thickening at this level allowing for artifact, which may indicate
chronicity.  No fracture, osseous destruction, or acute osseous
abnormality is identified.  Presumed endotracheal and orogastric
tube partly visualized.
IMPRESSION: Interval worsening of pansinusitis.

## 2011-06-29 IMAGING — CT CT ABD-PELV W/O CM
2 of 4 series · 16 of 46 positions shown, 18 images · non-contrast
Comparison: CT abdomen pelvis 07/06/2009.

CLINICAL DATA: Fever.  Metabolic acidosis.  Septic shock.  Chronic
kidney disease.  Diffuse pars B-cell lymphoma for which the patient
has undergone radiation therapy.  Surgical history includes
hysterectomy.

CT ABDOMEN AND PELVIS WITHOUT CONTRAST 11/06/2009:
TECHNIQUE: Multidetector CT imaging of the abdomen and pelvis was
performed following the standard protocol without intravenous
contrast.

[Series 2: abd/pelv w/o 5.0 b31f st · axial · non-contrast · 0.84mm/px · z∈[-483,-68]mm · 13 of 91 slices shown, 15 images]
[im 4/91  soft-tissue]
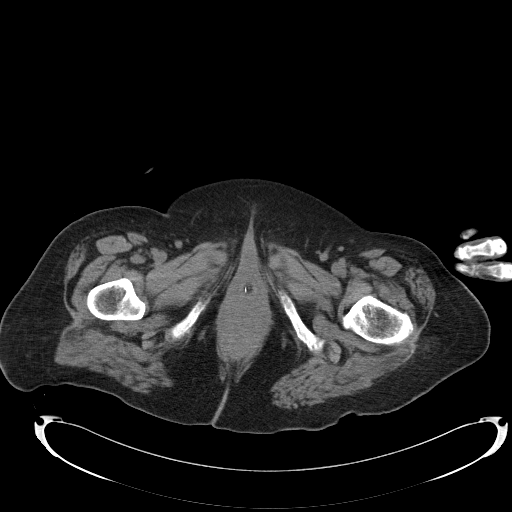
[im 4/91  bone]
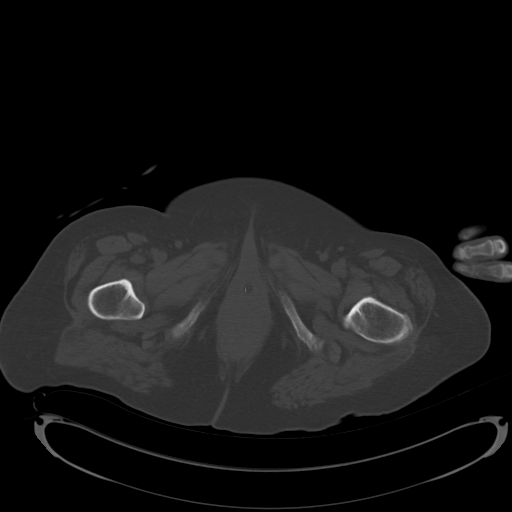
[im 11/91  soft-tissue]
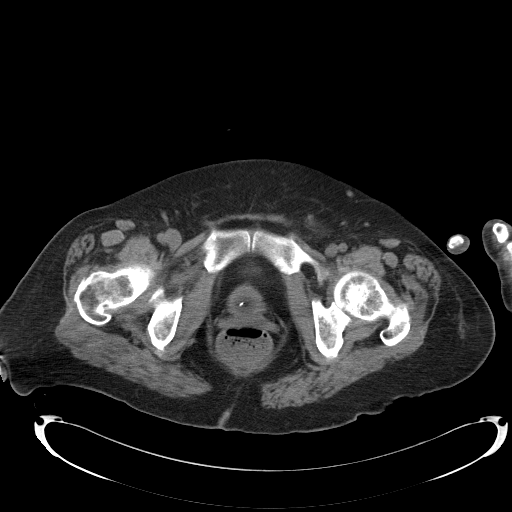
[im 19/91  soft-tissue]
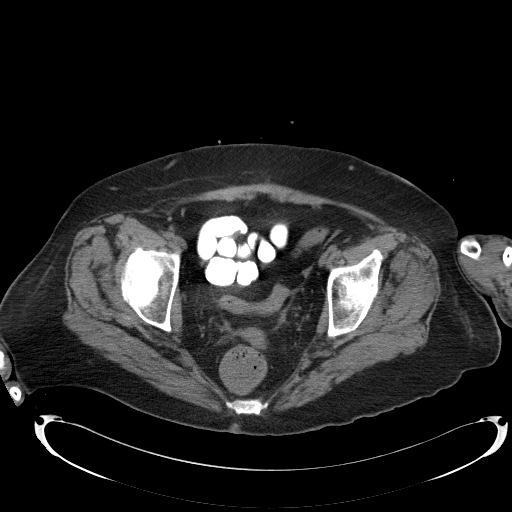
[im 26/91  soft-tissue]
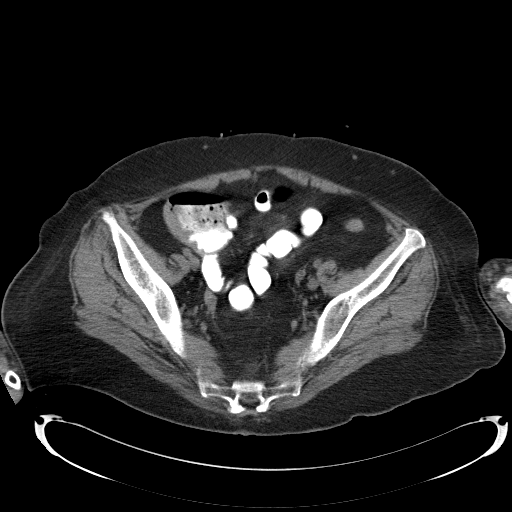
[im 33/91  soft-tissue]
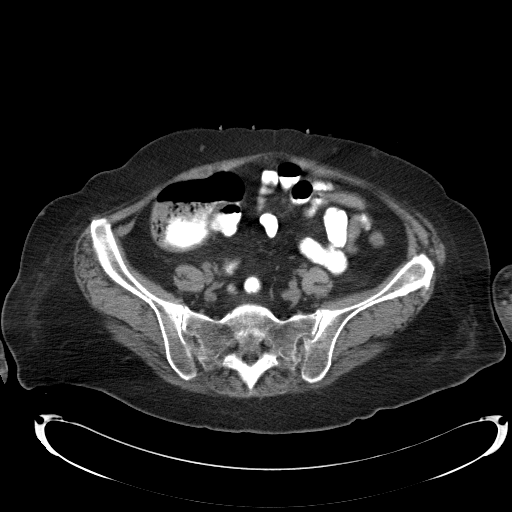
[im 40/91  soft-tissue]
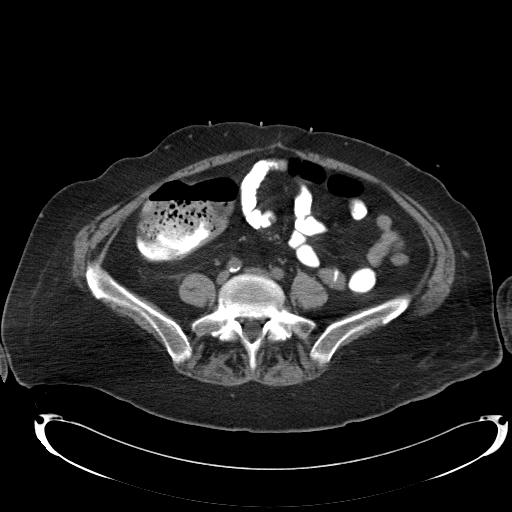
[im 47/91  soft-tissue]
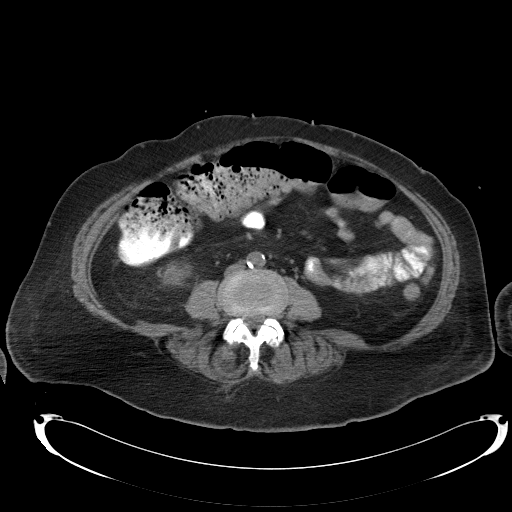
[im 51/91  soft-tissue]
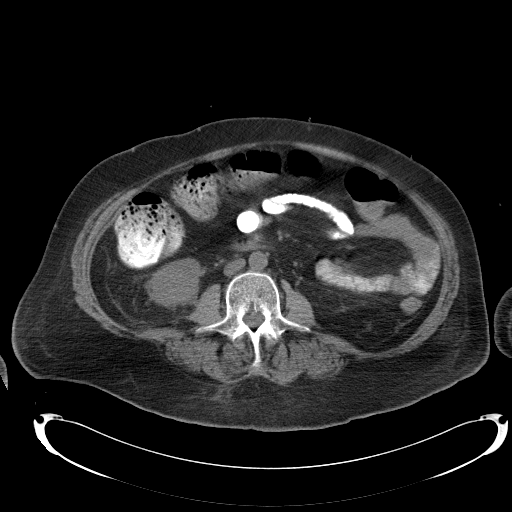
[im 58/91  soft-tissue]
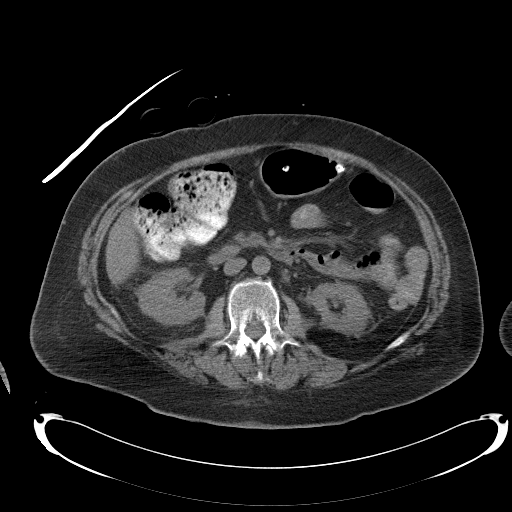
[im 58/91  bone]
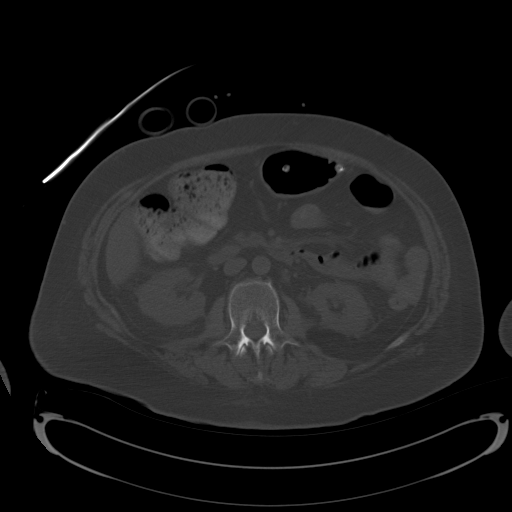
[im 65/91  soft-tissue]
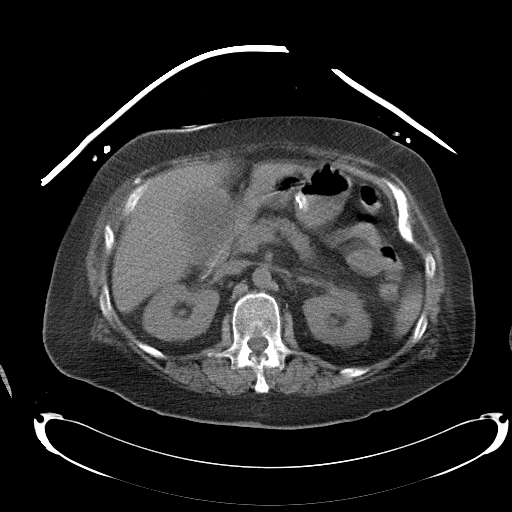
[im 73/91  soft-tissue]
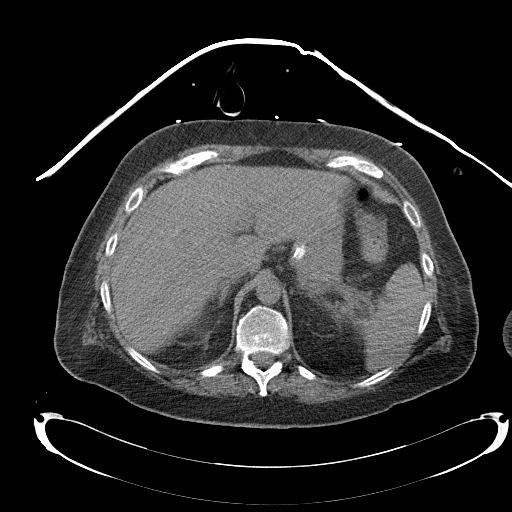
[im 80/91  soft-tissue]
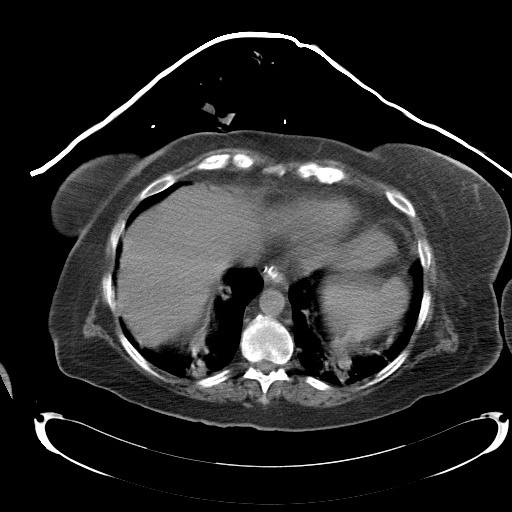
[im 87/91  soft-tissue]
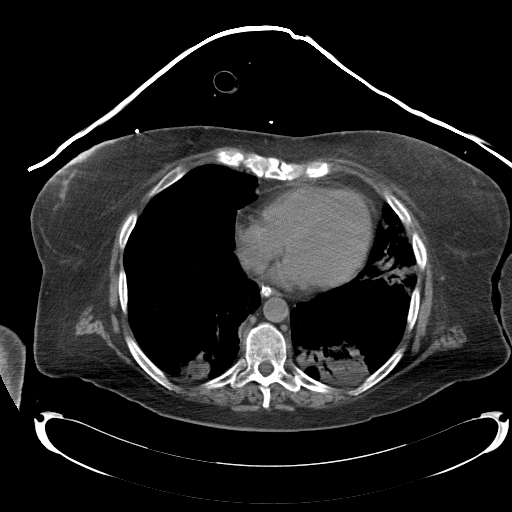

[Series 5: abd/pelv w/o 2.0 spo cor thins · coronal · non-contrast · 1.04mm/px · 3 of 106 slices shown]
[im 36/106  soft-tissue]
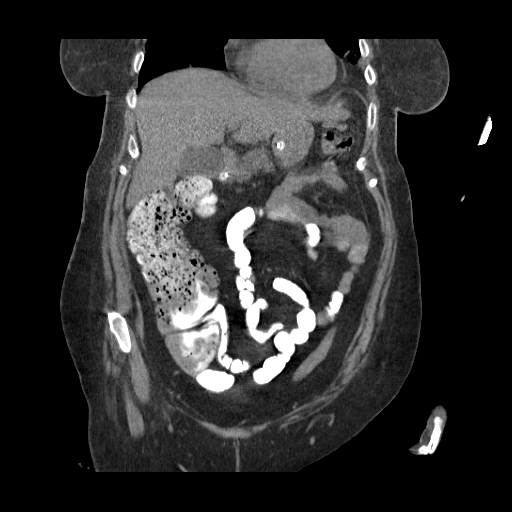
[im 47/106  soft-tissue]
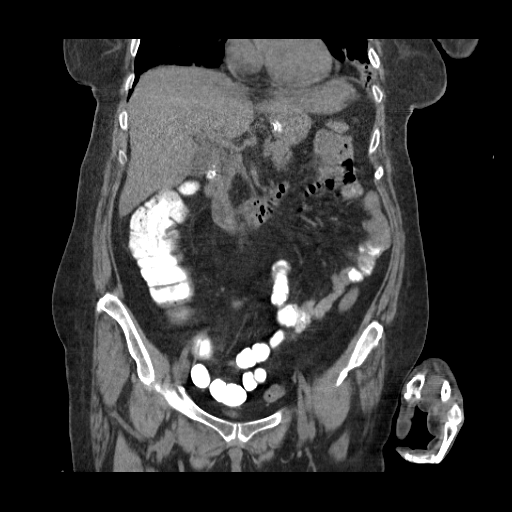
[im 59/106  soft-tissue]
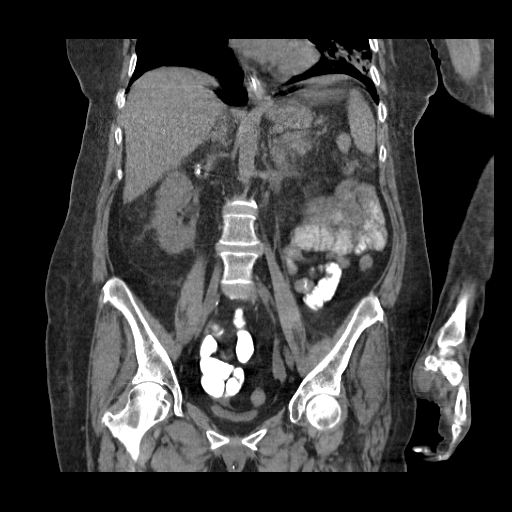

[16 of 46 positions shown; findings below may reference images not displayed]

FINDINGS: Interval essential resolution of the large mass arising
in the upper pole of the left kidney, with only a small amount of
residual abnormal soft tissue centrally at the hilum.  Interval
resolution of the previously identified mesenteric and
retroperitoneal lymphadenopathy since the prior examination.
Streaky soft tissue in the perinephric space adjacent to the upper
pole of the left kidney is consistent with treated lymphoma.  No
significant lymphadenopathy in the abdomen or pelvis currently.

Normal unenhanced appearance of the liver, spleen, pancreas,
adrenal glands, and right kidney.  Gallbladder mildly distended
without evidence of cholelithiasis.  Nasogastric tube looped in the
stomach with its tip in the descending duodenum.  Stomach
decompressed and unremarkable.  Small bowel normal in appearance
throughout the abdomen and pelvis.  Large amount of stool in the
cecum and ascending colon; descending colon, sigmoid colon, and
rectum relatively decompressed.  No ascites.  Mild aorto-iliac
atherosclerosis.

Uterus surgically absent.  Urinary bladder decompressed by Foley
catheter.  Normal-appearing ovaries.  No adnexal masses or free
pelvic fluid.  Numerous pelvic phleboliths.  Airspace consolidation
in the lingula, right middle lobe, and both lower lobes, with air
bronchograms.  Heart size normal.  Degenerative changes involving
the lower thoracic and lumbar spine.
IMPRESSION: 1.  Pneumonia involving both lower lobes, the lingula, and the
right middle lobe.
2.  Interval near complete resolution of the large mass involving
the left kidney since the prior CT from July 2009.  The only
remaining abnormal soft tissue is centrally in the left kidney at
the hilum.
3.  Interval resolution of lymphadenopathy in the retroperitoneum
and mesentery since the prior CT.  No significant lymphadenopathy
currently.

## 2011-06-30 ENCOUNTER — Telehealth: Payer: Self-pay | Admitting: Internal Medicine

## 2011-06-30 NOTE — Telephone Encounter (Signed)
Pt's last 1:10 vaccine mailed 03-03-09 no longer on vaccine

## 2011-07-01 IMAGING — CR DG CHEST 1V PORT
1 series · 1 of 1 positions shown · non-contrast
Comparison: 11/07/2009

CLINICAL DATA: Sepsis.  Respiratory distress.  Renal failure.
Lymphoma.

PORTABLE CHEST - 1 VIEW

[view not recorded]
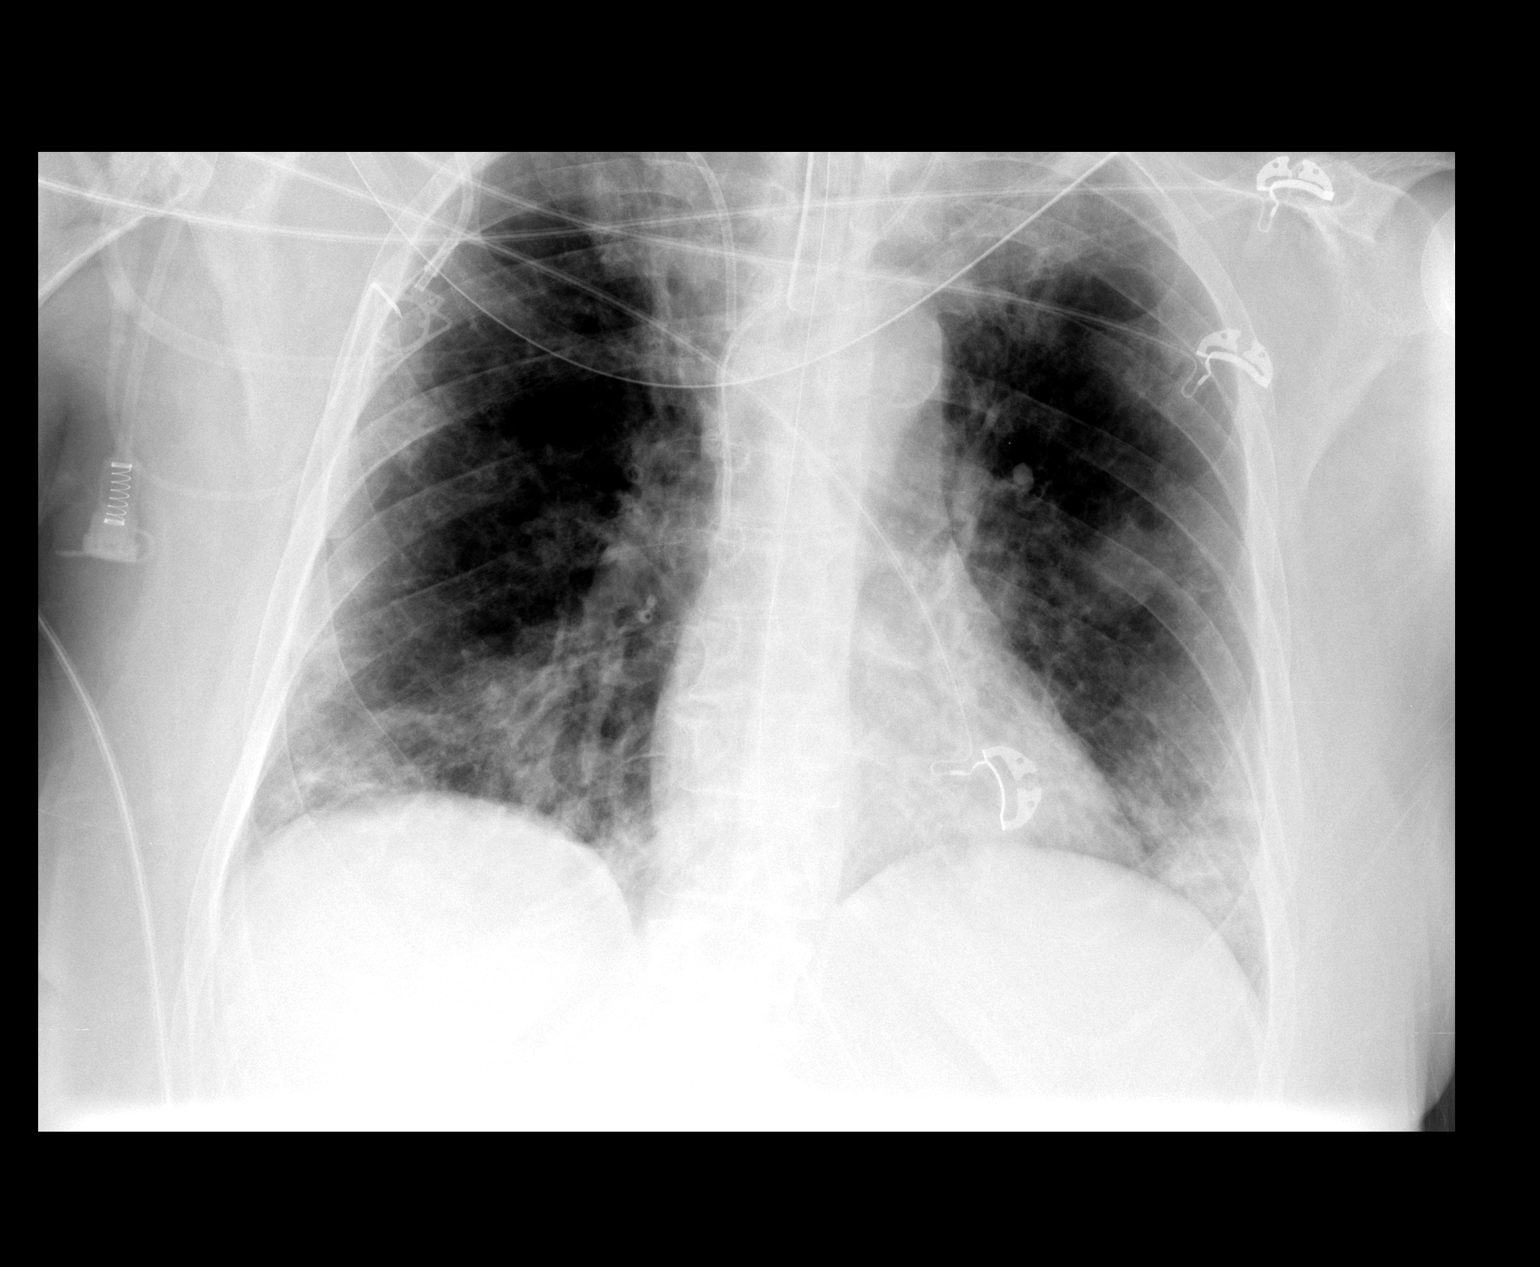

[1 of 1 positions shown; findings below may reference images not displayed]

FINDINGS: Endotracheal tube is in place with tip 3 cm above carina.
Nasogastric tube is in place with tip off the film.  Right Port-A-
Cath tip overlies the superior vena cava.  Left IJ central line tip
overlies the superior vena cava.  Patchy infiltrates are identified
bilaterally, not significantly changed over most recent exams.
IMPRESSION: Little interval change.

## 2011-07-02 IMAGING — CR DG CHEST 1V PORT
1 series · 1 of 1 positions shown · non-contrast
Comparison: Portable chest x-rays yesterday and dating back
11/05/2009.

CLINICAL DATA: Ventilator dependent respiratory failure.  Sepsis.
Renal failure.  Lymphoma.

PORTABLE CHEST - 1 VIEW [DATE]/[id number] hours:

[AP]
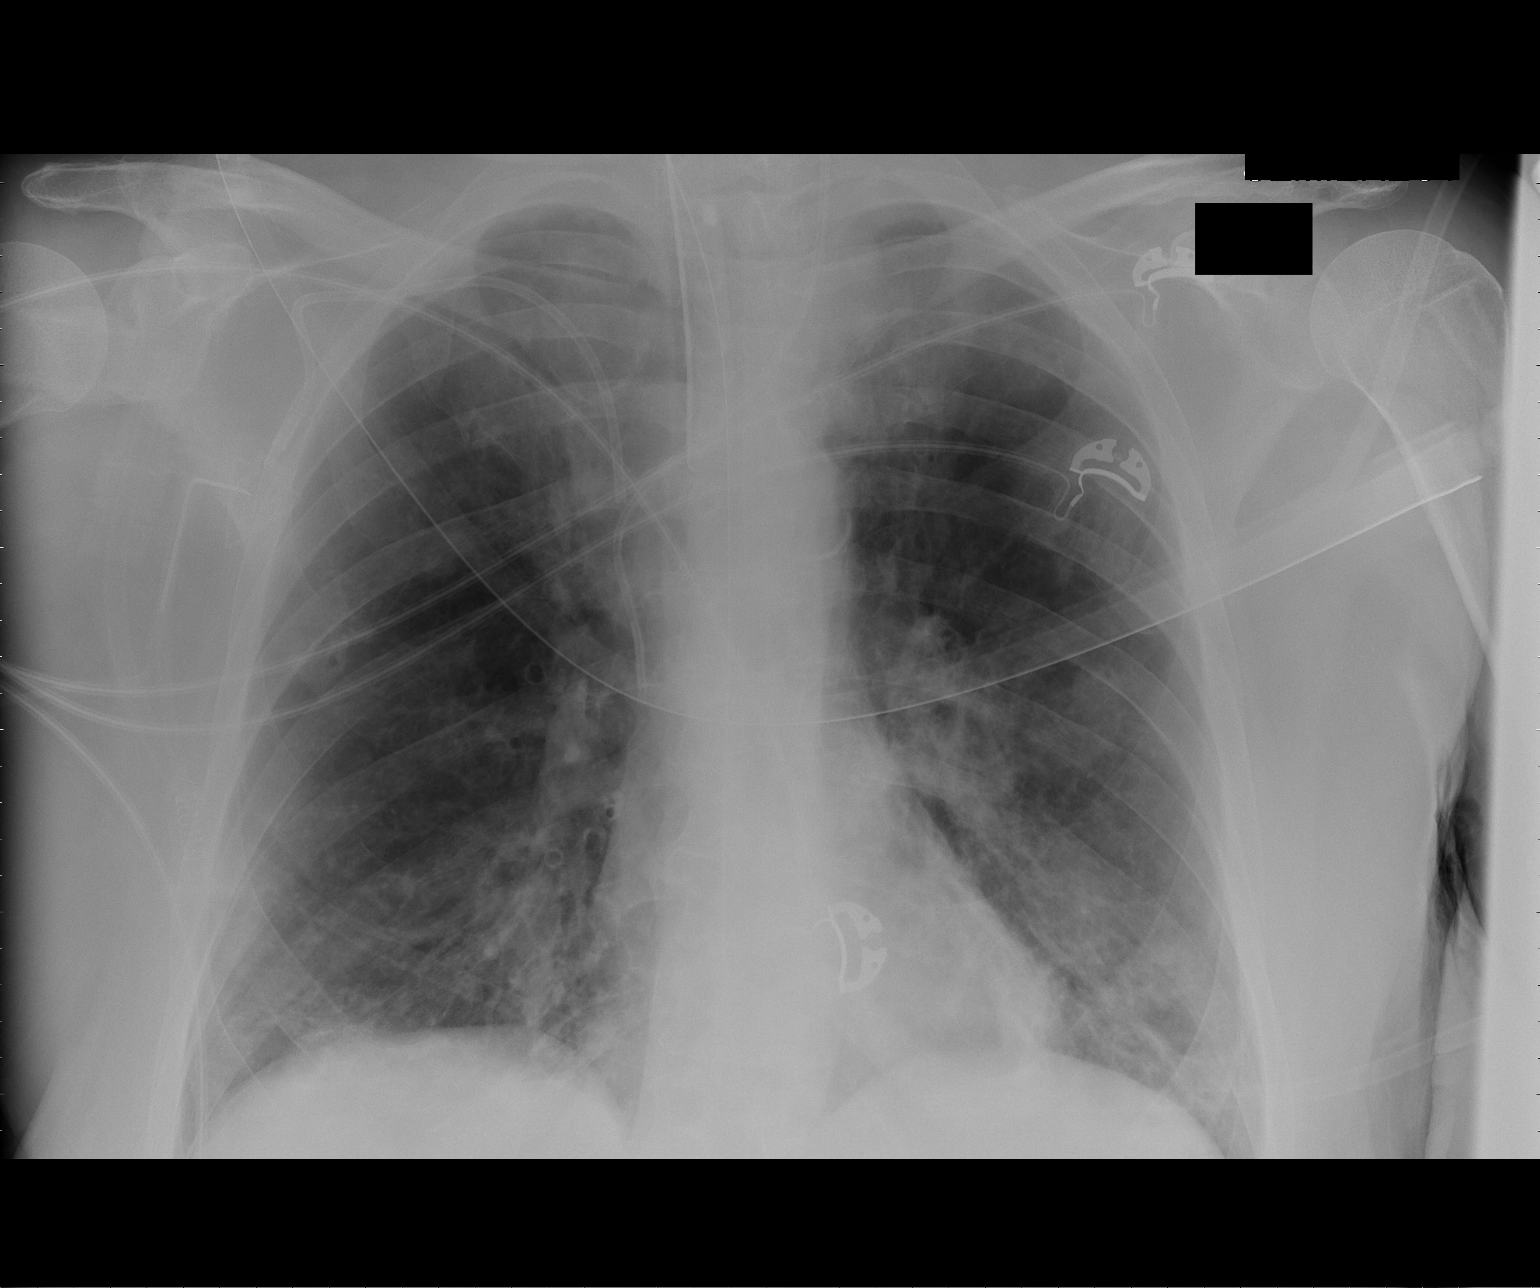

[1 of 1 positions shown; findings below may reference images not displayed]

FINDINGS: Endotracheal tube tip in satisfactory position
approximately 3 cm above the carina.  Right subclavian Port-A-Cath
tip in the SVC.  Left jugular central venous catheter tip in the
SVC.  Slight improvement since yesterday in the patchy airspace
opacities in both lung bases.  No new pulmonary parenchymal
abnormalities.  Cardiac silhouette normal in size.  Pulmonary
vascularity normal.
IMPRESSION: Improved patchy pneumonia in the lung bases.  No new abnormalities.

## 2011-07-04 ENCOUNTER — Telehealth (HOSPITAL_COMMUNITY): Payer: Self-pay | Admitting: Oncology

## 2011-07-04 ENCOUNTER — Ambulatory Visit: Payer: BC Managed Care – PPO | Admitting: Adult Health

## 2011-07-04 ENCOUNTER — Ambulatory Visit (HOSPITAL_COMMUNITY)
Admission: RE | Admit: 2011-07-04 | Discharge: 2011-07-04 | Disposition: A | Discharge status: Home / Self Care | Payer: BC Managed Care – PPO | Source: Ambulatory Visit | Attending: Oncology | Admitting: Oncology

## 2011-07-04 ENCOUNTER — Encounter (HOSPITAL_COMMUNITY): Payer: BC Managed Care – PPO | Attending: Oncology | Admitting: Oncology

## 2011-07-04 ENCOUNTER — Telehealth (HOSPITAL_COMMUNITY): Payer: Self-pay | Admitting: *Deleted

## 2011-07-04 VITALS — BP 130/77 | HR 69 | Temp 98.1°F | Wt 178.0 lb

## 2011-07-04 DIAGNOSIS — R51 Headache: Secondary | ICD-10-CM

## 2011-07-04 DIAGNOSIS — R519 Headache, unspecified: Secondary | ICD-10-CM

## 2011-07-04 DIAGNOSIS — Z87898 Personal history of other specified conditions: Secondary | ICD-10-CM | POA: Insufficient documentation

## 2011-07-04 DIAGNOSIS — I871 Compression of vein: Secondary | ICD-10-CM

## 2011-07-04 DIAGNOSIS — D801 Nonfamilial hypogammaglobulinemia: Secondary | ICD-10-CM | POA: Insufficient documentation

## 2011-07-04 DIAGNOSIS — C8589 Other specified types of non-Hodgkin lymphoma, extranodal and solid organ sites: Secondary | ICD-10-CM

## 2011-07-04 DIAGNOSIS — I878 Other specified disorders of veins: Secondary | ICD-10-CM

## 2011-07-04 DIAGNOSIS — I998 Other disorder of circulatory system: Secondary | ICD-10-CM | POA: Insufficient documentation

## 2011-07-04 LAB — COMPREHENSIVE METABOLIC PANEL
BUN: 14 mg/dL (ref 6–23)
CO2: 29 mEq/L (ref 19–32)
Chloride: 99 mEq/L (ref 96–112)
Creatinine, Ser: 1.05 mg/dL (ref 0.50–1.10)
GFR calc Af Amer: 67 mL/min — ABNORMAL LOW (ref >90)
GFR calc non Af Amer: 58 mL/min — ABNORMAL LOW (ref >90)
Glucose, Bld: 111 mg/dL — ABNORMAL HIGH (ref 70–99)
Total Bilirubin: 0.3 mg/dL (ref 0.3–1.2)

## 2011-07-04 LAB — DIFFERENTIAL
Lymphocytes Relative: 11 % — ABNORMAL LOW (ref 12–46)
Monocytes Absolute: 0.4 10*3/uL (ref 0.1–1.0)
Monocytes Relative: 6 % (ref 3–12)
Neutro Abs: 5.8 10*3/uL (ref 1.7–7.7)

## 2011-07-04 LAB — CBC
HCT: 36.7 % (ref 36.0–46.0)
Hemoglobin: 12.1 g/dL (ref 12.0–15.0)
MCHC: 33 g/dL (ref 30.0–36.0)
MCV: 93.6 fL (ref 78.0–100.0)
WBC: 7.2 10*3/uL (ref 4.0–10.5)

## 2011-07-04 IMAGING — CR DG CHEST 1V PORT
1 series · 1 of 1 positions shown · non-contrast
Comparison: 11/09/2009

CLINICAL DATA: Sepsis, renal failure, lymphoma

PORTABLE CHEST - 1 VIEW

[view not recorded]
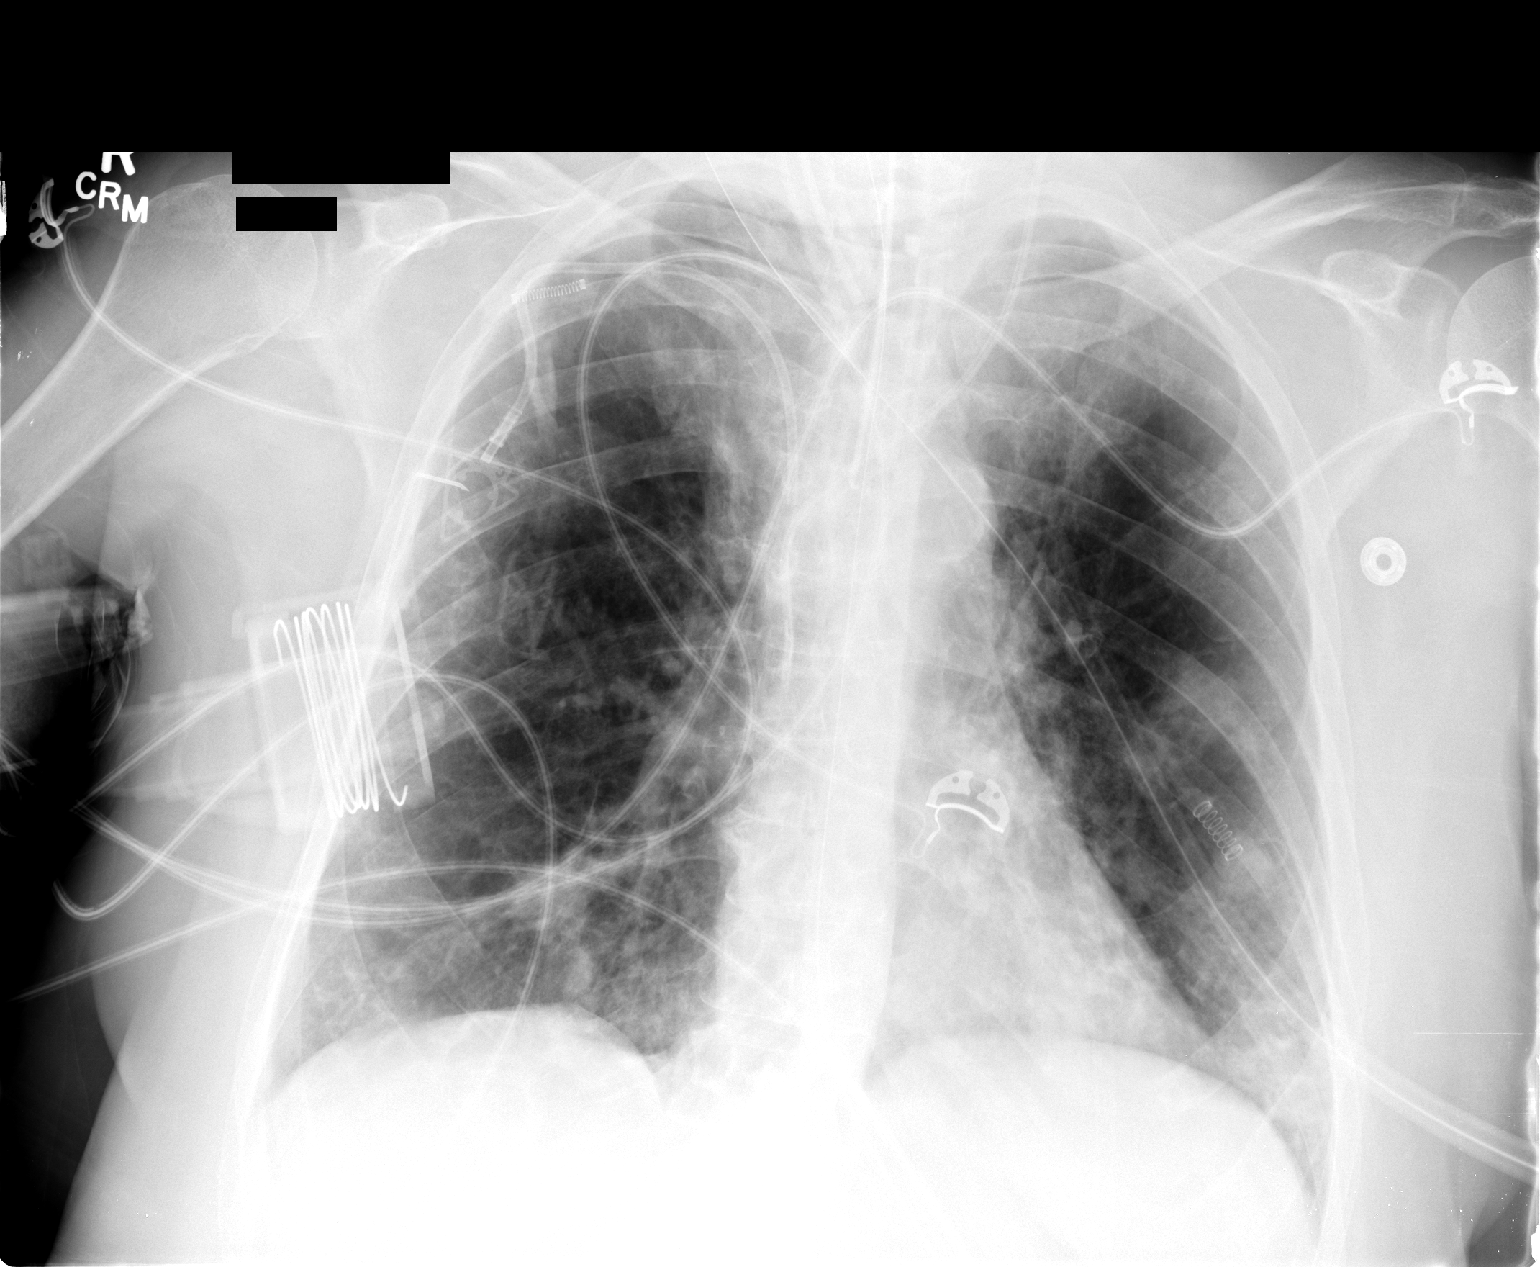

[1 of 1 positions shown; findings below may reference images not displayed]

FINDINGS: Stable support apparatus.  Telemetry leads overlie the
chest.  Normal heart size and vascularity.  Patchy nodular
bilateral airspace disease consistent with an infectious
process/pneumonia.  No developing effusion or pneumothorax.  No
significant interval change.
IMPRESSION: Stable portable chest exam.

## 2011-07-04 MED ORDER — HEPARIN SOD (PORK) LOCK FLUSH 100 UNIT/ML IV SOLN
INTRAVENOUS | Status: AC
  Filled 2011-07-04: qty 5

## 2011-07-04 MED ORDER — GADOBENATE DIMEGLUMINE 529 MG/ML IV SOLN
15.0000 mL | Freq: Once | INTRAVENOUS | Status: AC | PRN
  Administered 2011-07-04: 15 mL via INTRAVENOUS

## 2011-07-04 MED ORDER — SODIUM CHLORIDE 0.9 % IJ SOLN
INTRAMUSCULAR | Status: AC
  Filled 2011-07-04: qty 10

## 2011-07-04 MED ORDER — HEPARIN SOD (PORK) LOCK FLUSH 100 UNIT/ML IV SOLN
500.0000 [IU] | Freq: Once | INTRAVENOUS | Status: AC
  Administered 2011-07-04: 250 [IU] via INTRAVENOUS

## 2011-07-04 MED ORDER — SODIUM CHLORIDE 0.9 % IJ SOLN
10.0000 mL | INTRAMUSCULAR | Status: DC | PRN
  Administered 2011-07-04: 10 mL via INTRAVENOUS
  Filled 2011-07-04: qty 10

## 2011-07-04 MED ORDER — HEPARIN SOD (PORK) LOCK FLUSH 100 UNIT/ML IV SOLN
500.0000 [IU] | Freq: Once | INTRAVENOUS | Status: AC
  Administered 2011-07-04: 250 [IU] via INTRAVENOUS
  Filled 2011-07-04: qty 5

## 2011-07-04 NOTE — Telephone Encounter (Signed)
[employer] [phone] CALLED [employer] TO SEE IF AUTH IS REQUIRED FOR BRAIN MRI. PER FRANCES NO AUTH IS REQUIRED  [id number],[email or web address]

## 2011-07-04 NOTE — Progress Notes (Signed)
Problem #1 diffuse large B-cell lymphoma stage IVb status post R. CHOP x4 cycles with intrathecal chemotherapy during cycles 3 and 4 with methotrexate and soluCortef but therapy was stopped due to severe Pseudomonas sepsis after cycles 2 and 4.  Problem #2 chronic Pseudomonas sinusitis on recent IV antibiotics for pulmonary infection as well by Dr. 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 She took them for 7 days. She still has her PICC line in place in the right upper arm. She has stable vital signs and no fever today. They are recorded in the chart. Her lungs are quite junky though with rhonchi and wheezes bilaterally both superiorly and inferiorly. Her heart shows a regular rhythm and rate without S3 gallop her abdomen is soft and nontender she has no adenopathy in any location she has no leg edema and her skin is warm and dry to touch.  She does complain of headaches the last 2-3 weeks at Monday when she wakes up in the morning they're more on the size of her head and top of her head. These do not her headaches that she has when she has infection of her sinuses she states. These are different for her. She is alert and oriented EOMs are intact she has pupils which are either responsive to light and her eyegrounds show no papilledema and she has venous pulsations bilaterally. Facial symmetry is intact. Her neck is supple. Her gait is normal.  I do however worry about her headaches and we'll get an MRI to make sure she does not have an occult infectious process and she has been infected  so many times we will also get some blood work. We will see her back here in 8 weeks. She is to see Dr. Delford Field tomorrow.

## 2011-07-04 NOTE — Patient Instructions (Addendum)
[name]  [id number] 01-03-1956 Dr. Glenford Peers   [facility]  Discharge Instructions  RECOMMENDATIONS MADE BY THE CONSULTANT AND ANY TEST RESULTS WILL BE SENT TO YOUR REFERRING DOCTOR.   EXAM FINDINGS BY MD TODAY AND SIGNS AND SYMPTOMS TO REPORT TO CLINIC OR PRIMARY MD: Exam and discussion per MD.  Need to evaluate your headaches and will try to do MRI of your brain today.  Will also check your labs to day as well.  MEDICATIONS PRESCRIBED: none   INSTRUCTIONS GIVEN AND DISCUSSED: Other :  Report fevers, night sweats, frequent infections, etc.  SPECIAL INSTRUCTIONS/FOLLOW-UP: Return to Clinic in 2 months.   I acknowledge that I have been informed and understand all the instructions given to me and received a copy. I do not have any more questions at this time, but understand that I may call the Specialty Clinic at [facility] at [phone] during business hours should I have any further questions or need assistance in obtaining follow-up care.    __________________________________________  _____________  __________ Signature of Patient or Authorized Representative            Date                   Time    __________________________________________ Nurse's Signature

## 2011-07-04 NOTE — Progress Notes (Signed)
07/04/11 1730 Had MRI of Brain and PICC line was used to inject contrast.  Flushed with saline in Radiology and returns to clinic for Heparin flush.  Flush performed.

## 2011-07-04 NOTE — Addendum Note (Signed)
Addended by: Evelena Leyden on: 07/04/2011 05:41 PM   Modules accepted: Orders, SmartSet

## 2011-07-05 ENCOUNTER — Encounter: Payer: Self-pay | Admitting: Adult Health

## 2011-07-05 ENCOUNTER — Ambulatory Visit (INDEPENDENT_AMBULATORY_CARE_PROVIDER_SITE_OTHER): Payer: BC Managed Care – PPO | Admitting: Adult Health

## 2011-07-05 VITALS — BP 124/68 | HR 72 | Temp 97.3°F | Ht 62.0 in | Wt 178.0 lb

## 2011-07-05 DIAGNOSIS — J329 Chronic sinusitis, unspecified: Secondary | ICD-10-CM

## 2011-07-05 LAB — IGG, IGA, IGM
IgA: 111 mg/dL (ref 69–380)
IgG (Immunoglobin G), Serum: 702 mg/dL (ref 690–1700)

## 2011-07-05 IMAGING — CR DG CHEST 1V PORT
1 series · 1 of 1 positions shown · non-contrast
Comparison: 11/12/2009 and 11/11/2009.

CLINICAL DATA: Line placement.  Renal failure and lymphoma.

PORTABLE CHEST - 1 VIEW

[AP]
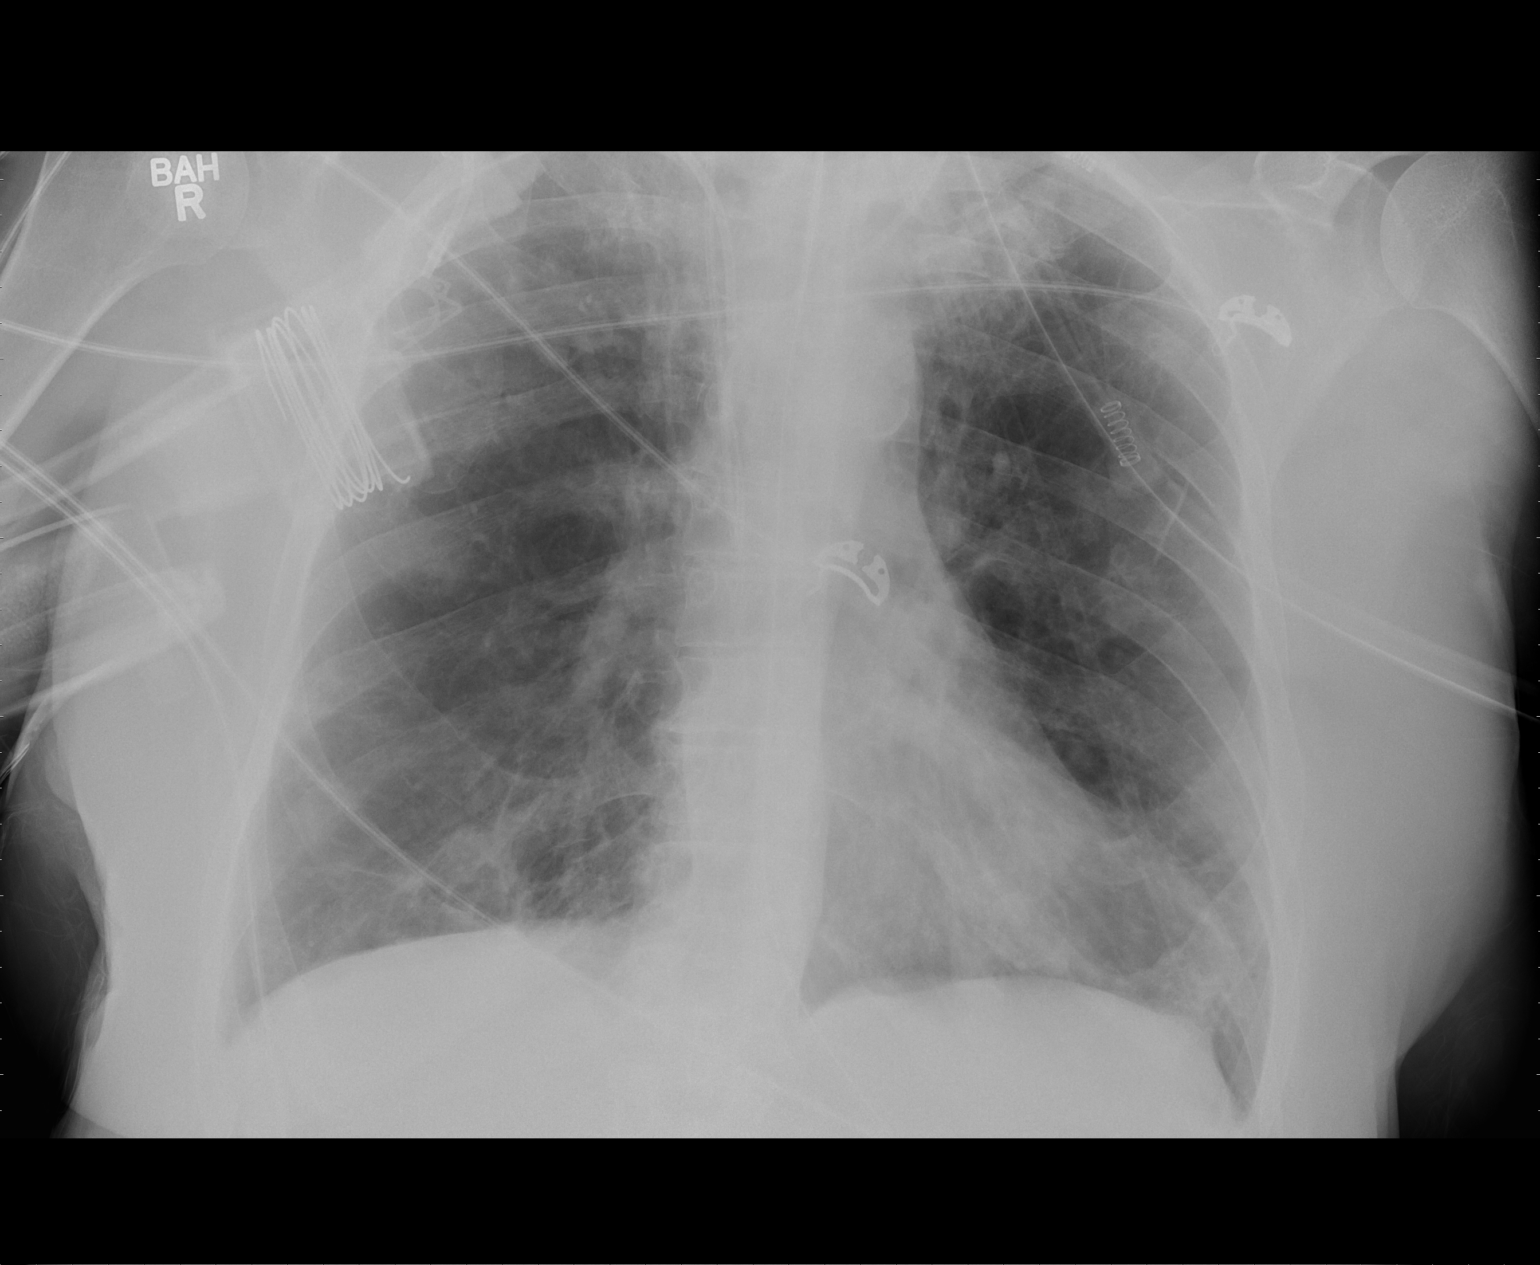

[1 of 1 positions shown; findings below may reference images not displayed]

FINDINGS: 1618 hours.  There is a new right IJ central venous
catheter in the lower SVC.  Right subclavian Port-A-Cath and left
IJ central venous catheter are unchanged in position.  Endotracheal
tube and nasogastric tube are unchanged in position.  There are
persistent basilar air space opacities with slightly overall
improved pulmonary aeration.  No pneumothorax is demonstrated.  The
heart size and mediastinal contours are stable.
IMPRESSION: Central line placement as described.  No evidence of pneumothorax.

## 2011-07-05 IMAGING — CR DG CHEST 1V PORT
1 series · 1 of 1 positions shown · non-contrast
Comparison: Chest radiograph 11/11/2009

CLINICAL DATA: Sepsis, renal failure

PORTABLE CHEST - 1 VIEW

[AP]
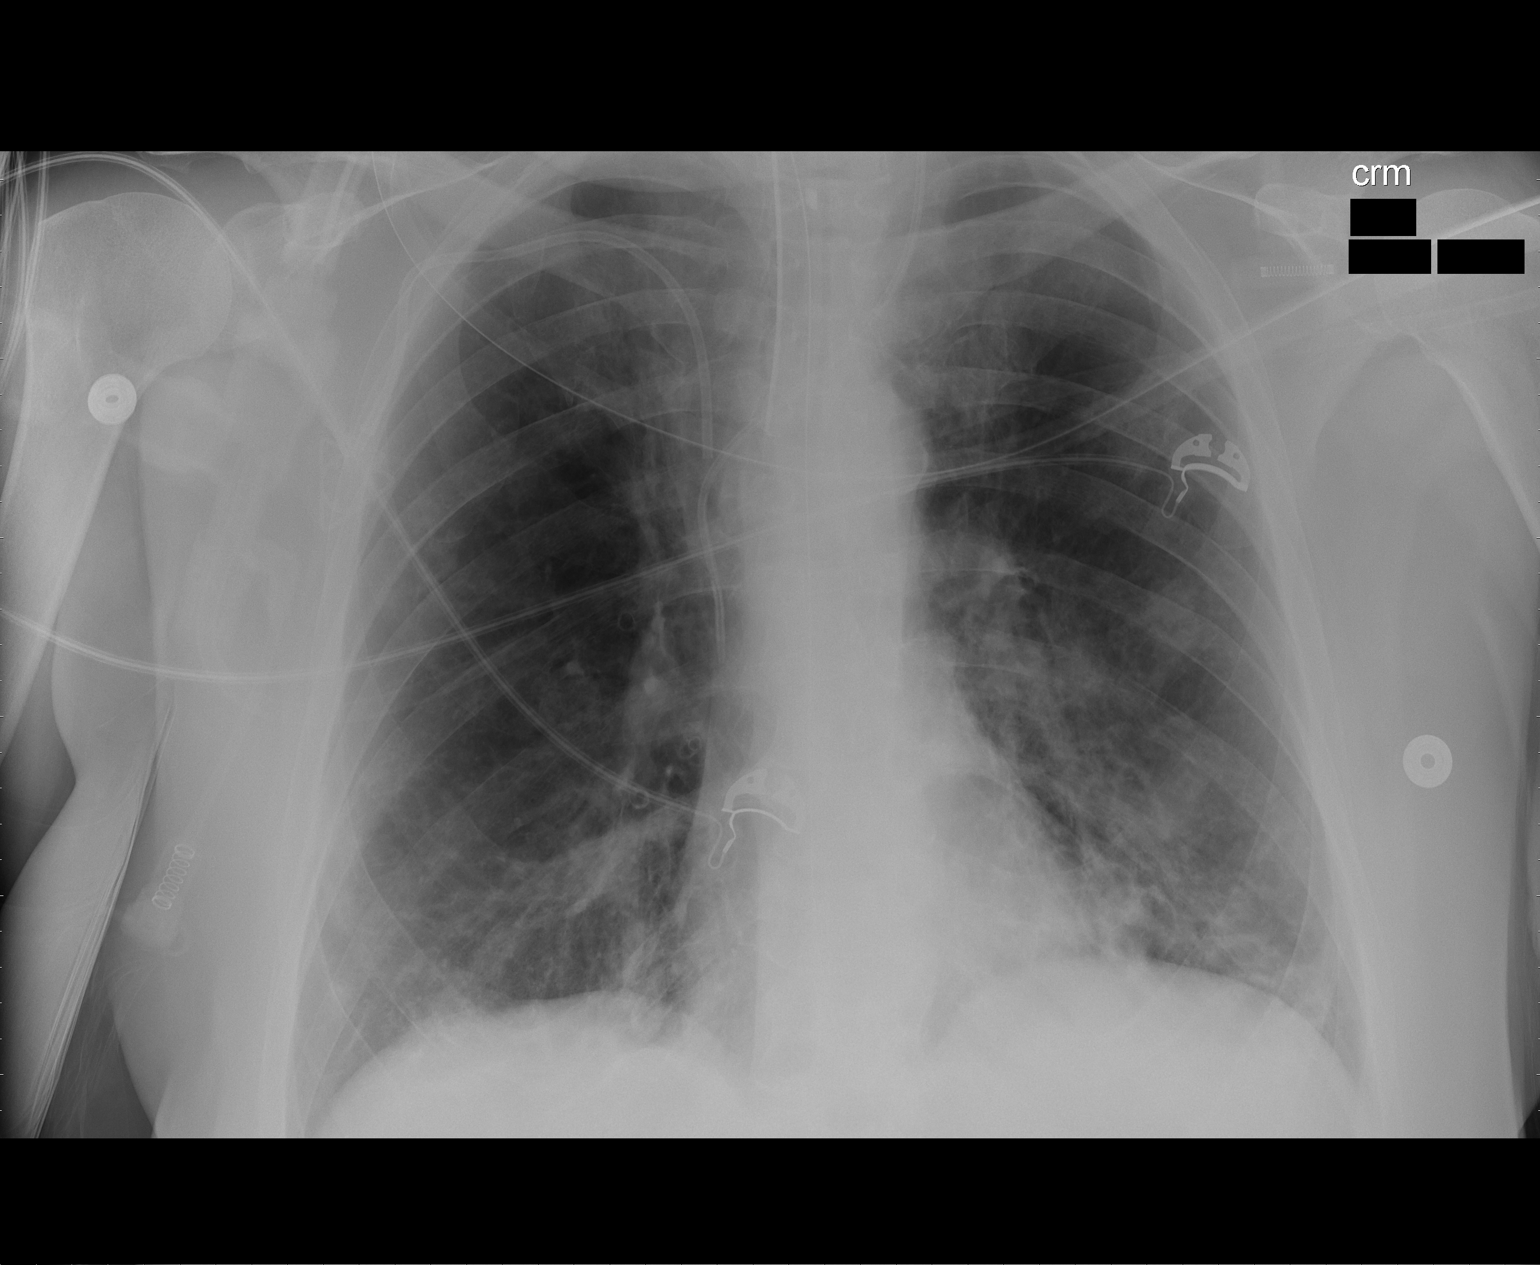

[1 of 1 positions shown; findings below may reference images not displayed]

FINDINGS: Endotracheal tube, NG tube, right central venous line,
and left central venous line are unchanged.  Normal cardiac
silhouette.  There are bilateral basilar linear lung which are
unchanged.  No evidence effusion, infiltrate, or pneumothorax.
IMPRESSION: 1.  Stable support apparatus.
2.  No interval change.
3.  Bibasilar infiltrates are stable.

## 2011-07-05 NOTE — Patient Instructions (Addendum)
We are referring you back to ENT -Dr. Annalee Genta .  Mucinex Twice daily  As needed  Cough/congestion  Fluids and rest  Saline nasal rinses As needed    Follow up Dr. Delford Field  In 4 weeks and As needed

## 2011-07-06 IMAGING — CR DG CHEST 1V PORT
1 series · 1 of 1 positions shown · non-contrast
Comparison: 11/12/2009 in multiple priors

CLINICAL DATA: Sepsis and renal failure

PORTABLE CHEST - 1 VIEW

[AP]
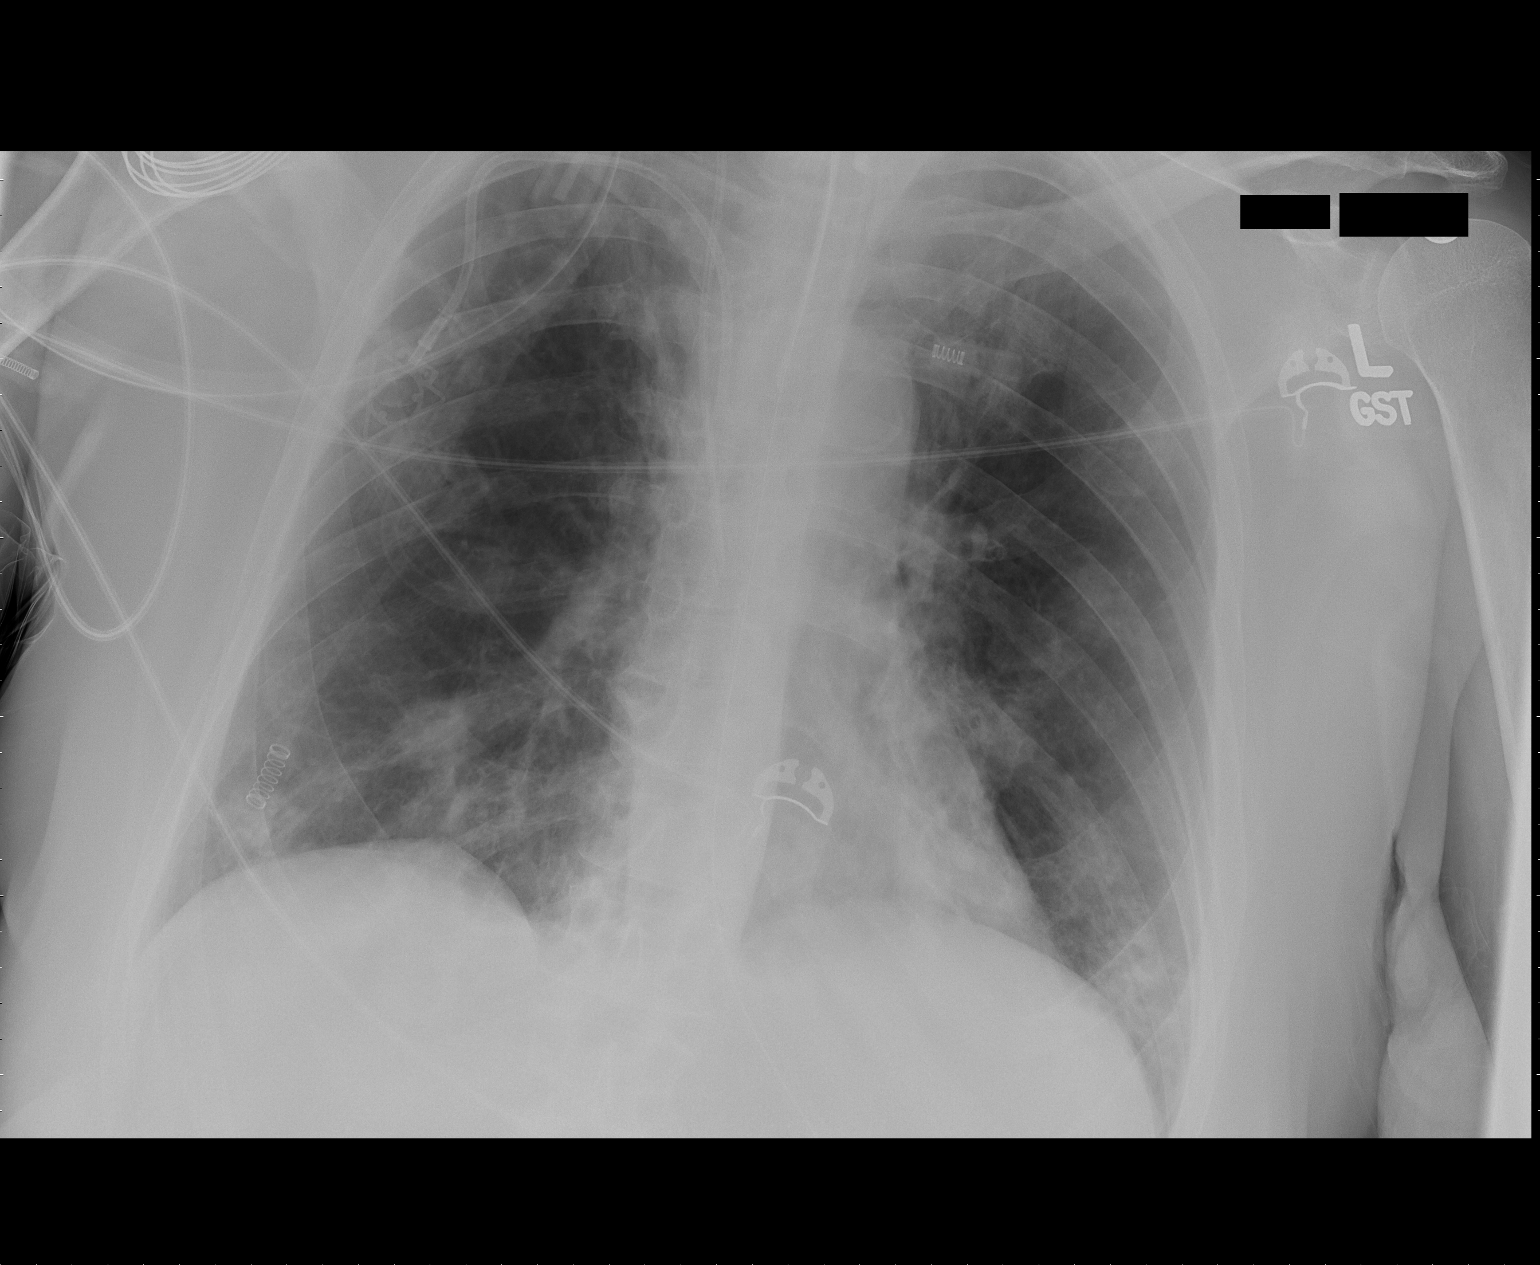

[1 of 1 positions shown; findings below may reference images not displayed]

FINDINGS: The endotracheal tube terminates approximately 3 cm above
the carina.  Nasogastric tube courses into the stomach and
continues below the edge of the image.  A right subclavian Port-A-
Cath and a right IJ central venous catheter terminate in the
superior vena cava.

Heart size is normal.  There are patchy opacities at both lung
bases.  Upper lung fields are well aerated.  No pleural effusion or
pneumothorax is visible.
IMPRESSION: No significant change in the bibasilar airspace disease.

## 2011-07-06 NOTE — Assessment & Plan Note (Addendum)
Acute on chronic sinusitis with prolong symptoms despite several courses of prolonged oral abx  And 3 courses of IV abx w/ Fortaz/Cefepime  Recent MRI - shows persistent sinus dz ? Chronic vs acute on right  Will refer back to ENT for further eval and suggestions  Keep PICC line for now

## 2011-07-06 NOTE — Progress Notes (Signed)
Subjective:    Patient ID: [name], female    DOB: May 18, 1955, 56 y.o.   MRN: [id number]  HPI  56 y.o.WF  Asthma , hypogammaglobulinemia, chronic sinusits d/t pseudomonas  02/14/11 Was given IgG IV per heme/onc.  Now feels better.  No real cough except in early am. Pt sneezing and yellow mucus over the past week.    Now off ABX [facility] further ABX  3/13 No real change from last ov.  Sl yellow mucus in the am.  Still gets gammaglob IV per heme/onc. Pt to receive Crx this week.  Rituxan is the only agent . No real wheeze.  No chest pains.  No excessSaba    Tolerated bactrim well 2/13 and helped sinus disease   PUL ASTHMA HISTORY 06/17/2011 05/11/2011 03/16/2011 08/11/2010  Symptoms 0-2 days/week Throughout the day 0-2 days/week Daily  Nighttime awakenings 3-4/month Often--7/wk 0-2/month 3-4/month  Interference with activity Minor limitations Some limitations No limitations Minor limitations  SABA use 0-2 days/wk Several times/day 0-2 days/wk > 2 days/wk--not > 1 x/day  Exacerbations requiring oral steroids 0-1 / year 2 or more / year 0-1 / year 0-1 / year   05/11/2011 Coughing for one month.  Productive in AM of yellow.  Some mixed with blood over past weekend.  No chest pain, just heavy.  Notes nasal drip yellow.  Severe dry hacky cough.    IV IgG to start in 24hrs. Notes fever at home 101 every afternoon, for one month.    Last Chemorx was 3/13. >>rx IV Cefepime x 7 d, and steroid taper   5/23 Follow up  Returns for 2 week follow up sinusitis. She was treated with 7 days of Cefepime via PICC .   - reports symptoms have improved but still having prod cough with yellow mucus, some wheezing and SOB. No hemoptysis or chest pain  No edema  Still blowing out yellow mucus w/ foul odor.   06/17/2011 Feels much better, still mild foul odor.  No real chest pain.  Only sl heeze, still with cough spells not as bad.  Not as dyspneic.  Pt received total 14 days of IV Cefepime. Pt denies any  significant sore throat, nasal congestion or excess secretions, fever, chills, sweats, unintended weight loss, pleurtic or exertional chest pain, orthopnea PND, or leg swelling Pt denies any increase in rescue therapy over baseline, denies waking up needing it or having any early am or nocturnal exacerbations of coughing/wheezing/or dyspnea. Pt also denies any obvious fluctuation in symptoms with  weather or environmental change or other alleviating or aggravating factors Pt is still on coumadin.  Pt notes weight up 10# in one week >>Fortaz x 7 days via PICC   07/05/2011 Follow up  2 week follow up sinusitis - reports breathing and coughing are unchanged since last visit with SOB, coughing, chest tightness.  had MRI head for recurrent headache. Showed persistent sinusitis on right. Over last 2 months she had received 3  courses of IV abx via PICC (2 -7d and 1 -14 d regimens)  She does feel better w/ clear mucus but not resolved with persistent sinus tenderness, pressure and teeth pain . Mucus is clear. Cough on/off.  No wheezing  Has chronic sinusitis d/t psuedomonas. Had sinus surgery in past x 2 . Followed by ENT.  Still has PICC line. No redness or fever.  No chest pain or hemoptysis.      Past Medical History  Diagnosis Date  . Non Hodgkin's  lymphoma   . Allergic rhinitis   . GERD (gastroesophageal reflux disease)   . Asthma   . Chronic sinusitis   . Respiratory failure   . Cavitary lung disease      Family History  Problem Relation Age of Onset  . Emphysema Mother   . Allergies Father   . Asthma Father     as a child  . Leukemia Maternal Grandmother   . Diabetes Brother   . Stroke Mother      History   Social History  . Marital Status: Divorced    Spouse Name: N/A    Number of Children: 3  . Years of Education: N/A   Occupational History  . [employer] Service Manager    Social History Main Topics  . Smoking status: Never Smoker   . Smokeless tobacco: Never Used    . Alcohol Use: No  . Drug Use: Not on file  . Sexually Active: Not on file   Other Topics Concern  . Not on file   Social History Narrative  . No narrative on file     Allergies  Allergen Reactions  . Meperidine Hcl   . Montelukast Sodium      Outpatient Prescriptions Prior to Visit  Medication Sig Dispense Refill  . ACCU-CHEK AVIVA PLUS test strip as directed.      [name] Kitchen acyclovir (ZOVIRAX) 200 MG capsule 2 capsules twice per day       . albuterol (PROVENTIL) (2.5 MG/3ML) 0.083% nebulizer solution Take 2.5 mg by nebulization every 4 (four) hours as needed.  120 mL  6  . albuterol (VENTOLIN HFA) 108 (90 BASE) MCG/ACT inhaler Inhale 2 puffs into the lungs every 6 (six) hours as needed.  1 Inhaler  5  . benzonatate (TESSALON) 200 MG capsule Take 1 capsule by mouth Three times daily as needed.      . citalopram (CELEXA) 20 MG tablet Take 20 mg by mouth every morning.        [name] Kitchen EPINEPHrine (EPIPEN IJ) As needed for severe allergic reaction       . furosemide (LASIX) 20 MG tablet Take 1 tablet by mouth as needed.      . Hydrocod Polst-Chlorphen Polst (TUSSICAPS) 10-8 MG CP12 Take 1 capsule by mouth 2 (two) times daily as needed.  20 each  0  . Lancets (ACCU-CHEK MULTICLIX) lancets as directed.      . Mometasone Furo-Formoterol Fum 200-5 MCG/ACT AERO Inhale 2 Inhalers into the lungs 2 (two) times daily.  1 Inhaler  6  . omeprazole (PRILOSEC) 20 MG capsule Take 1 capsule (20 mg total) by mouth 2 (two) times daily.  60 capsule  6  . potassium chloride 40 MEQ/15ML (20%) LIQD 2 tablespoons twice daily       . predniSONE (DELTASONE) 10 MG tablet Take 5 mg by mouth daily.      . simvastatin (ZOCOR) 80 MG tablet Take 80 mg by mouth at bedtime.        [name] Kitchen warfarin (COUMADIN) 10 MG tablet Take by mouth daily. Currently taking 6 mg alternation with 7mg       . zafirlukast (ACCOLATE) 20 MG tablet Take 1 tablet (20 mg total) by mouth 2 (two) times daily.  60 tablet  5   Facility-Administered Medications  Prior to Visit  Medication Dose Route Frequency Provider Last Rate Last Dose  . 0.9 %  sodium chloride infusion   Intravenous Continuous Randall An, MD 20 mL/hr at 05/12/11 0914    .  gadobenate dimeglumine (MULTIHANCE) injection 15 mL  15 mL Intravenous Once PRN Medication Radiologist, MD   15 mL at 07/04/11 1707  . heparin lock flush 100 unit/mL  500 Units Intravenous Once Randall An, MD   250 Units at 07/04/11 1735  . sodium chloride 0.9 % injection 10 mL  10 mL Intravenous PRN Randall An, MD   10 mL at 07/04/11 1108     Review of Systems  Constitutional:   No  weight loss, night sweats,  Fevers, chills, fatigue, lassitude. HEENT:   Notes  headaches,  Difficulty swallowing,  Tooth/dental problems,  Sore throat,                No sneezing, itching, ear ache, nasal congestion, +++ post nasal drip,   CV:  No chest pain,  Orthopnea, PND, swelling in lower extremities, anasarca, dizziness, palpitations  GI  No heartburn, indigestion, abdominal pain, nausea, vomiting, diarrhea, change in bowel habits, loss of appetite  Resp: Notes  shortness of breath with exertion and  at rest.  Notes  excess mucus, notes  productive cough,  No non-productive cough,  No coughing up of blood.  Notes  change in color of mucus.  No wheezing.  No chest wall deformity  Skin: no rash or lesions.  GU: no dysuria, change in color of urine, no urgency or frequency.  No flank pain.  MS:  No joint pain or swelling.  No decreased range of motion.  No back pain.  Psych:  No change in mood or affect. No depression or anxiety.  No memory loss.     Objective:   Physical Exam  Filed Vitals:   07/05/11 1409  BP: 124/68  Pulse: 72  Temp: 97.3 F (36.3 C)  TempSrc: Oral  Height: 5\' 2"  (1.575 m)  Weight: 178 lb (80.74 kg)  SpO2: 99%    Gen: Pleasant, well-nourished, in no distress,  normal affect  ENT: No lesions,  mouth clear,  oropharynx clear,  Mild nasal redness , R nasal clear mucus    Neck: No JVD, no TMG, no carotid bruits  Lungs: No use of accessory muscles, no dullness to percussion, coarse BS w/ no wheezing   Cardiovascular: RRR, heart sounds normal, no murmur or gallops, no peripheral edema  Abdomen: soft and NT, no HSM,  BS normal  Musculoskeletal: No deformities, no cyanosis or clubbing  Neuro: alert, non focal  Skin: Warm, no lesions or rashes      Assessment & Plan:   No problem-specific assessment & plan notes found for this encounter.   Updated Medication List Outpatient Encounter Prescriptions as of 07/05/2011  Medication Sig Dispense Refill  . ACCU-CHEK AVIVA PLUS test strip as directed.      [name] Kitchen acyclovir (ZOVIRAX) 200 MG capsule 2 capsules twice per day       . albuterol (PROVENTIL) (2.5 MG/3ML) 0.083% nebulizer solution Take 2.5 mg by nebulization every 4 (four) hours as needed.  120 mL  6  . albuterol (VENTOLIN HFA) 108 (90 BASE) MCG/ACT inhaler Inhale 2 puffs into the lungs every 6 (six) hours as needed.  1 Inhaler  5  . benzonatate (TESSALON) 200 MG capsule Take 1 capsule by mouth Three times daily as needed.      . citalopram (CELEXA) 20 MG tablet Take 20 mg by mouth every morning.        [name] Kitchen EPINEPHrine (EPIPEN IJ) As needed for severe allergic reaction       . furosemide (  LASIX) 20 MG tablet Take 1 tablet by mouth as needed.      . Hydrocod Polst-Chlorphen Polst (TUSSICAPS) 10-8 MG CP12 Take 1 capsule by mouth 2 (two) times daily as needed.  20 each  0  . Lancets (ACCU-CHEK MULTICLIX) lancets as directed.      . Mometasone Furo-Formoterol Fum 200-5 MCG/ACT AERO Inhale 2 Inhalers into the lungs 2 (two) times daily.  1 Inhaler  6  . omeprazole (PRILOSEC) 20 MG capsule Take 1 capsule (20 mg total) by mouth 2 (two) times daily.  60 capsule  6  . potassium chloride 40 MEQ/15ML (20%) LIQD 2 tablespoons twice daily       . predniSONE (DELTASONE) 10 MG tablet Take 5 mg by mouth daily.      . simvastatin (ZOCOR) 80 MG tablet Take 80 mg by mouth at  bedtime.        [name] Kitchen warfarin (COUMADIN) 10 MG tablet Take by mouth daily. Currently taking 6 mg alternation with 7mg       . zafirlukast (ACCOLATE) 20 MG tablet Take 1 tablet (20 mg total) by mouth 2 (two) times daily.  60 tablet  5   Facility-Administered Encounter Medications as of 07/05/2011  Medication Dose Route Frequency Provider Last Rate Last Dose  . 0.9 %  sodium chloride infusion   Intravenous Continuous Randall An, MD 20 mL/hr at 05/12/11 0914    . gadobenate dimeglumine (MULTIHANCE) injection 15 mL  15 mL Intravenous Once PRN Medication Radiologist, MD   15 mL at 07/04/11 1707  . heparin lock flush 100 unit/mL  500 Units Intravenous Once Randall An, MD   250 Units at 07/04/11 1735  . DISCONTD: sodium chloride 0.9 % injection 10 mL  10 mL Intravenous PRN Randall An, MD   10 mL at 07/04/11 1108

## 2011-07-07 IMAGING — CR DG CHEST 1V PORT
1 series · 1 of 1 positions shown · non-contrast
Comparison: 11/13/2009, [DATE], [DATE], and 07/30/2009

No other significant change.

CLINICAL DATA: Sepsis.

PORTABLE CHEST - 1 VIEW

[view not recorded]
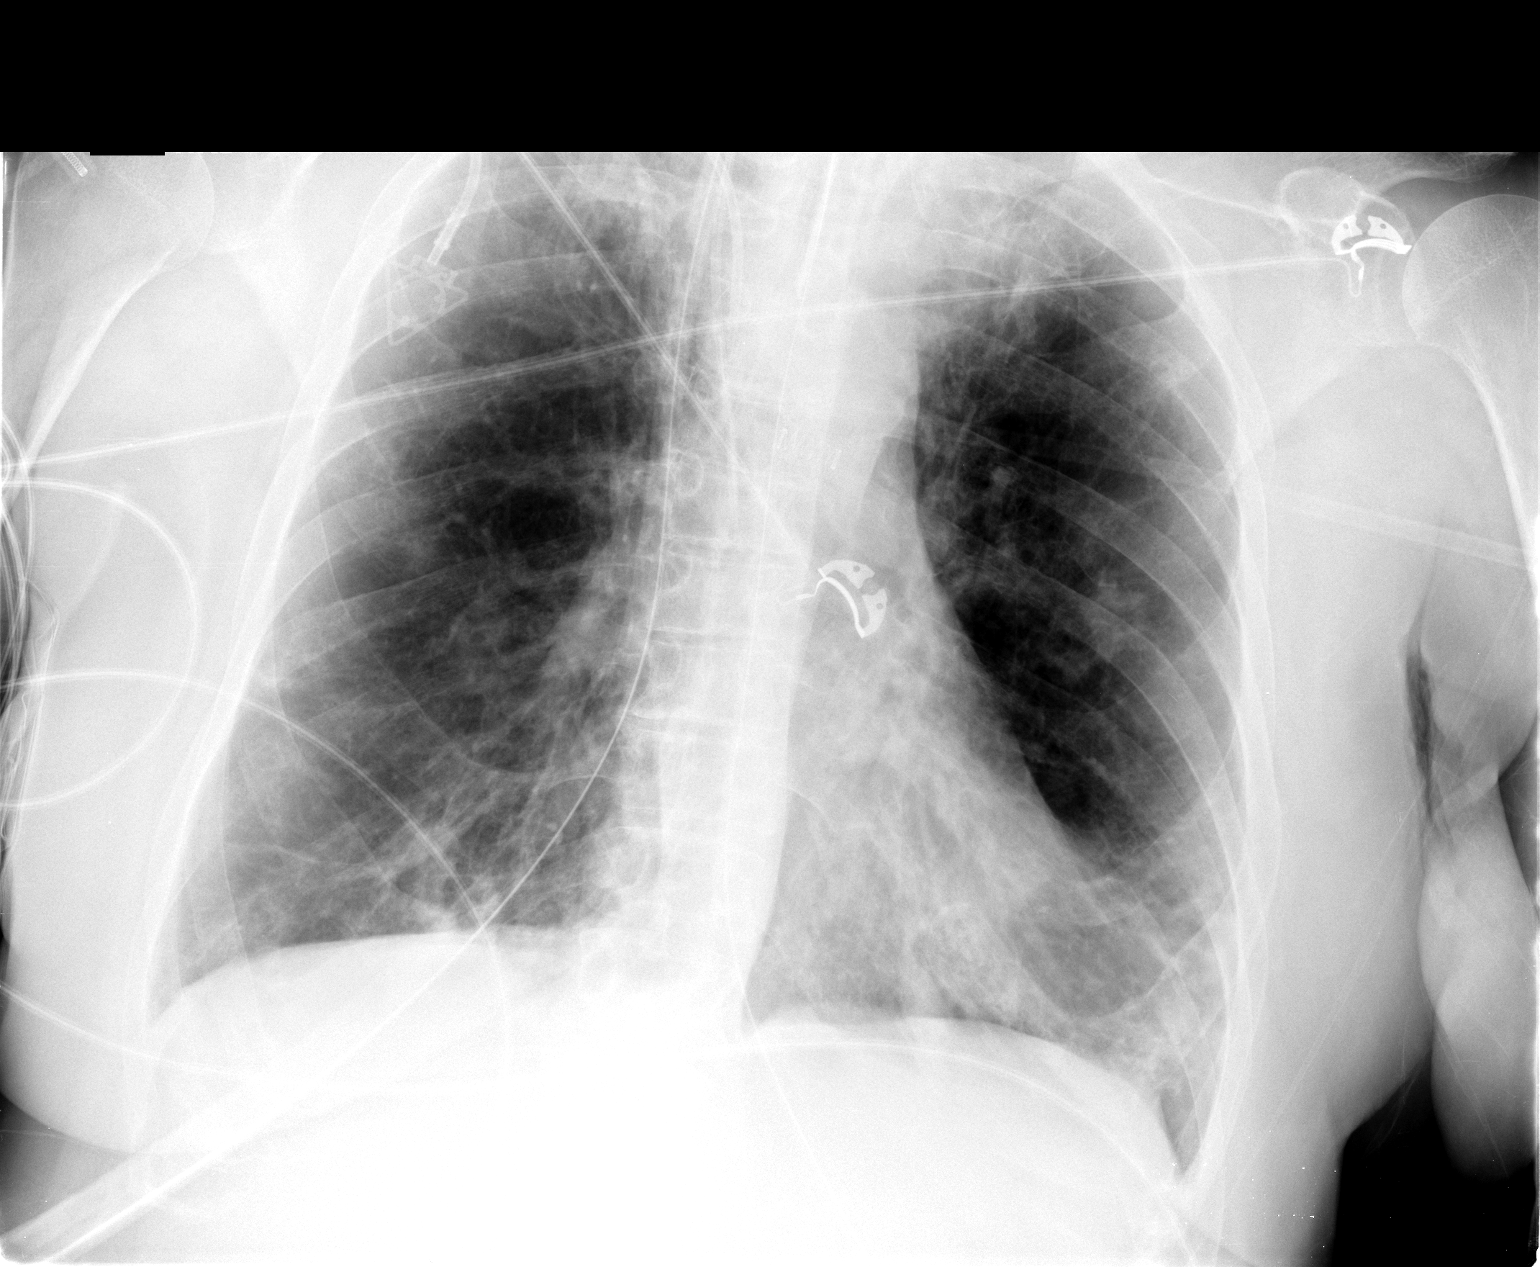

[1 of 1 positions shown; findings below may reference images not displayed]

FINDINGS: Endotracheal tube, Port-A-Cath, NG tube, and central
venous catheter are in good position.

The heart size and vascularity are normal.

There are multiple small patchy areas of ill-defined density in
both lungs, improved at the right lung base.  Ill-defined density
in the left midzone has slightly increased in prominence.
IMPRESSION: 1.  Support apparatus appear in good position.
2.  Improvement of the patchy infiltrates at the right base.
Slight increase in the small patchy infiltrate in the left midzone.

## 2011-07-08 ENCOUNTER — Telehealth: Payer: Self-pay | Admitting: Critical Care Medicine

## 2011-07-08 IMAGING — CR DG CHEST 1V PORT
1 series · 1 of 1 positions shown · non-contrast
Comparison: 11/14/2009

CLINICAL DATA: Sepsis, renal failure and respiratory distress.

PORTABLE CHEST - 1 VIEW

[view not recorded]
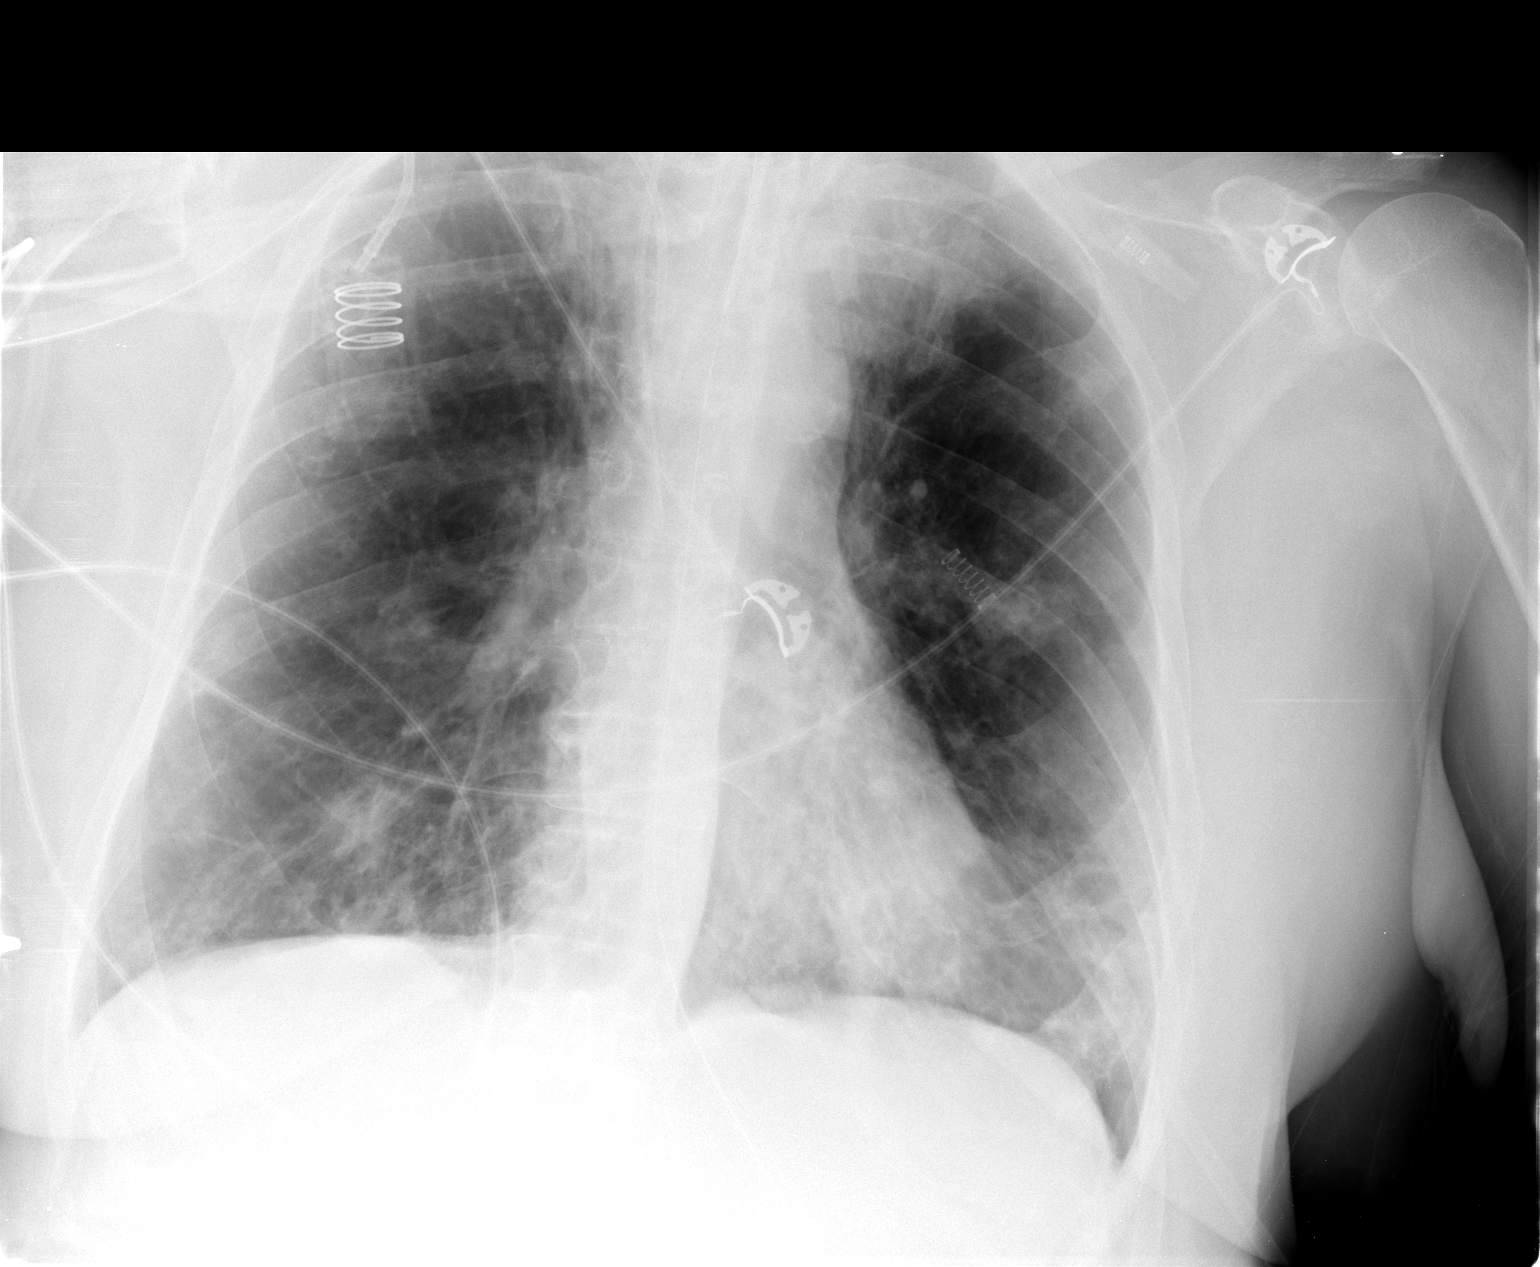

[1 of 1 positions shown; findings below may reference images not displayed]

FINDINGS: Endotracheal tube is 4.6 cm above the carina.
Nasogastric extends into the abdomen.  Right subclavian Port-A-Cath
tip in the upper SVC.  Again seen are patchy densities at the lung
bases.  Upper lungs are clear.  The round opacification in the left
mid lung is obscured by overlying support apparatuses.  Cannot
exclude a round lucency in the left lower lung.  Findings raise
concern for cavitary lesions.
IMPRESSION: Persistent patchy densities in the lung bases.  Concern for a
lesion in the left mid lung and cannot exclude a small cavitary
lesion in the left lower lung.  Recommend attention to these
findings on followup imaging and these findings could be better
characterized with CT.

## 2011-07-08 NOTE — Telephone Encounter (Signed)
[name] returned call. [name]

## 2011-07-08 NOTE — Telephone Encounter (Signed)
LMTCB

## 2011-07-09 IMAGING — CR DG CHEST 1V PORT
1 series · 1 of 1 positions shown · non-contrast
Comparison: 11/15/2009

CLINICAL DATA: Sepsis, respiratory distress

PORTABLE CHEST - 1 VIEW

[view not recorded]
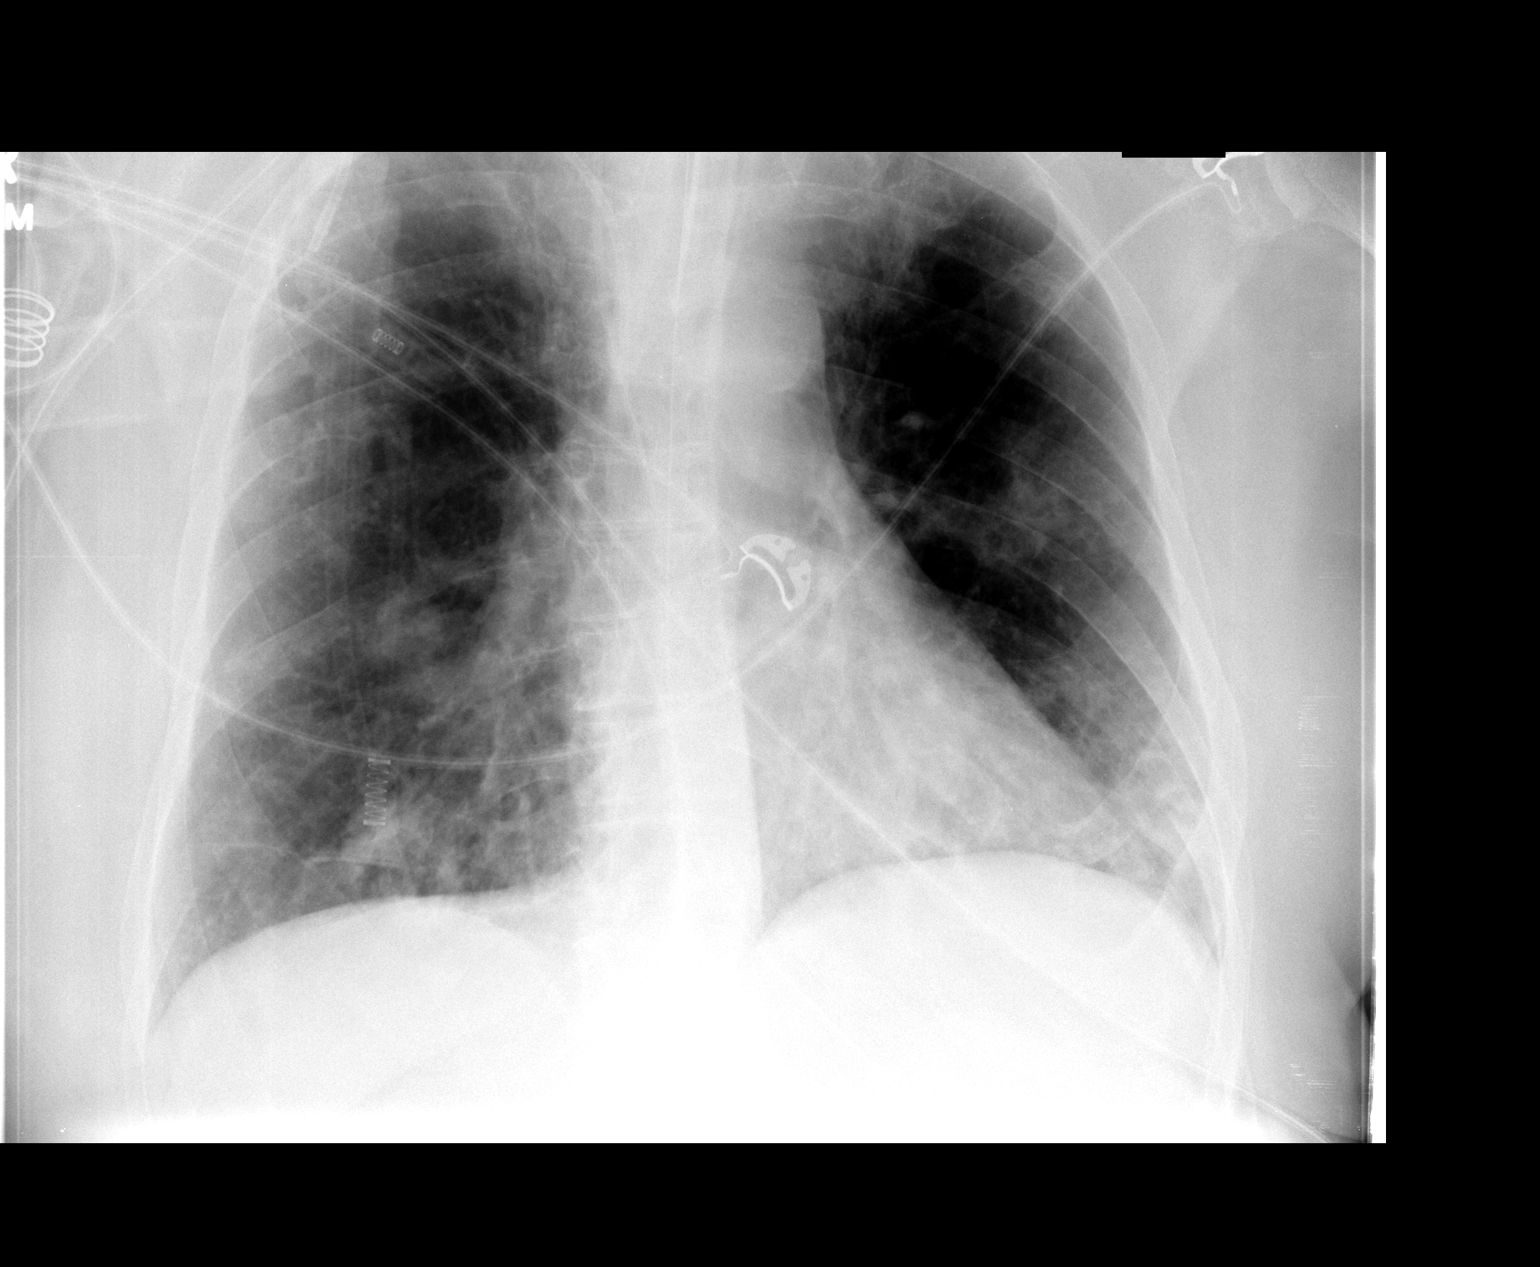

[1 of 1 positions shown; findings below may reference images not displayed]

FINDINGS: Endotracheal tube is 4.6 cm above the carina.  Support
apparatus stable in position.  Patchy nodular bilateral airspace
disease persist.  No significant change.  No new edema,
consolidation, collapse, effusion or pneumothorax.
IMPRESSION: Stable portable chest exam.

## 2011-07-09 IMAGING — CT CT CHEST W/O CM
3 of 4 series · 15 of 35 positions shown, 18 images · non-contrast
Comparison: 07/07/2009 chestCT and 11/06/2009 abdominal CT.

CLINICAL DATA: Sepsis.  Cough.  Fever. Shortness of breath.
Immunocompromised secondary to chemotherapy for non-Hodgkins
lymphoma.

CT CHEST WITHOUT CONTRAST
TECHNIQUE: Multidetector CT imaging of the chest was performed
following the standard protocol without IV contrast.

[Series 2: routine chest · axial · 0.62mm/px · z∈[-280,-25]mm · 9 of 67 slices shown, 12 images]
[im 8/67  mediastinal]
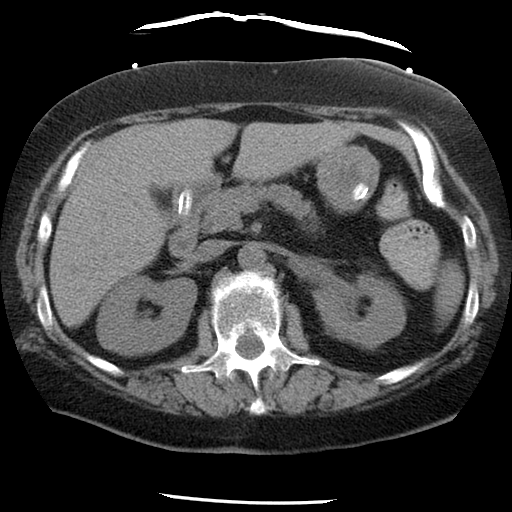
[im 8/67  lung]
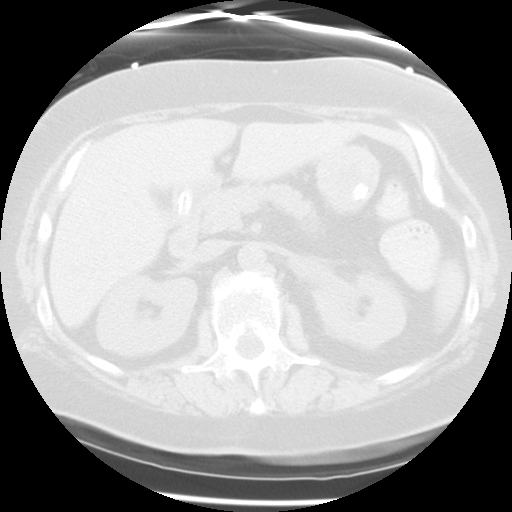
[im 15/67  lung]
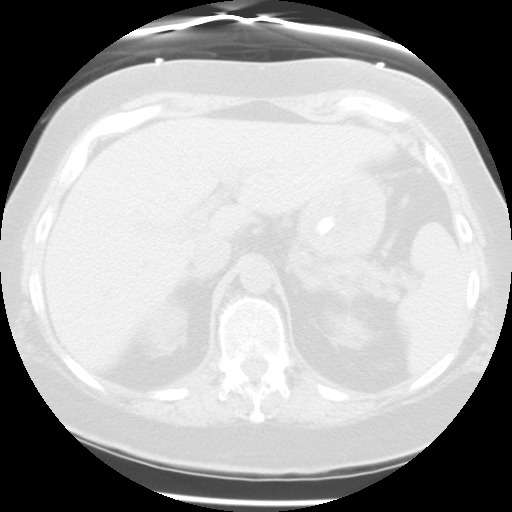
[im 23/67  lung]
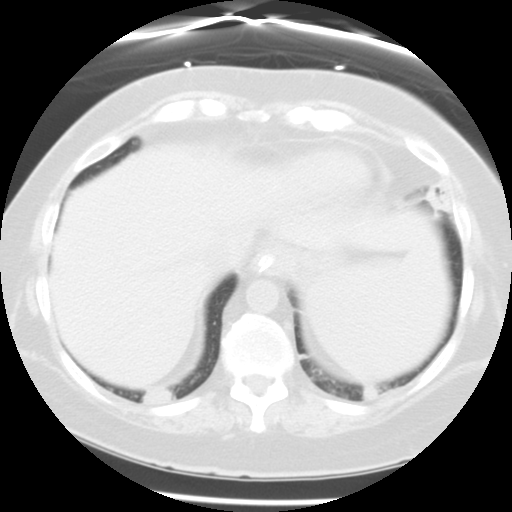
[im 30/67  lung]
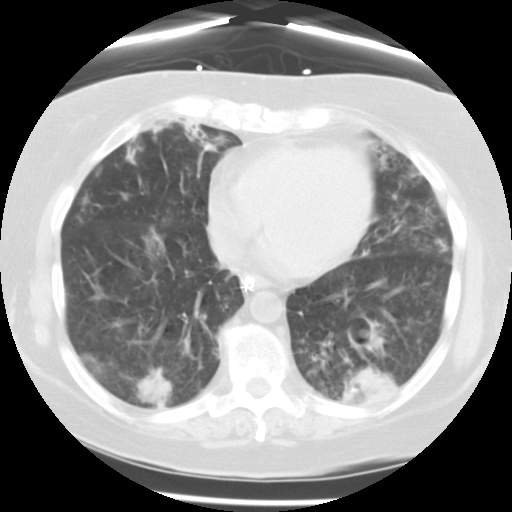
[im 33/67  mediastinal]
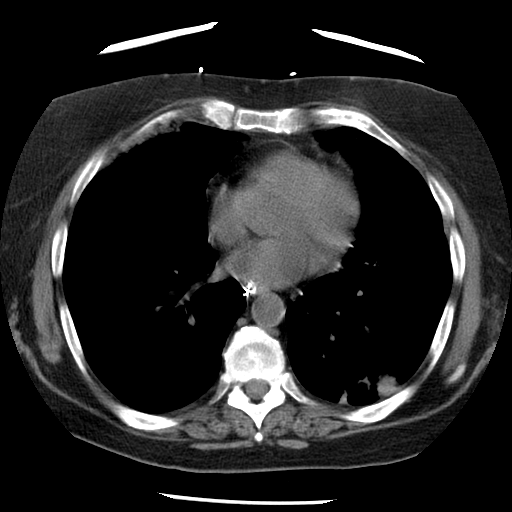
[im 33/67  lung]
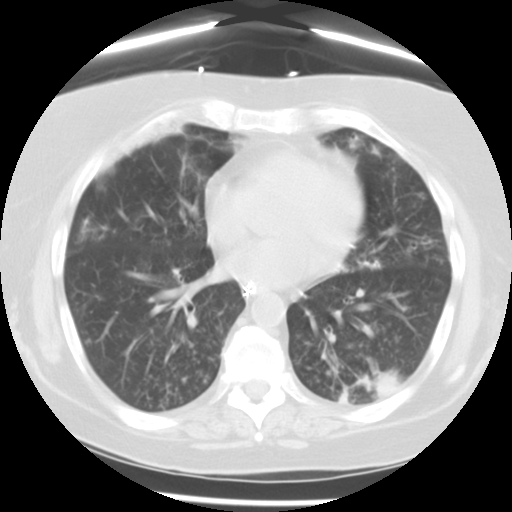
[im 37/67  lung]
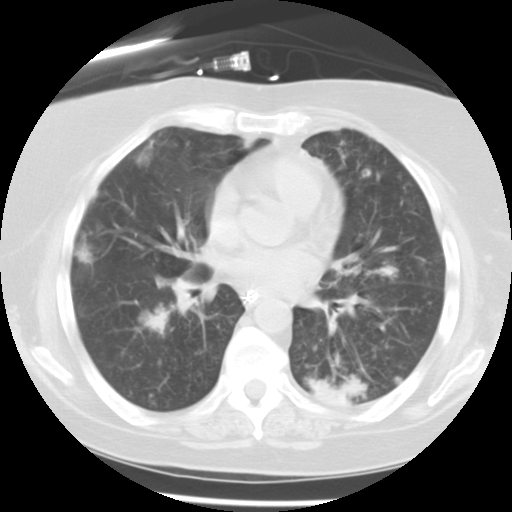
[im 45/67  lung]
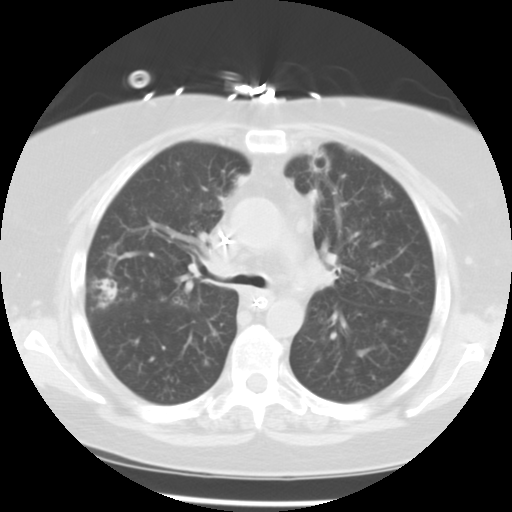
[im 52/67  lung]
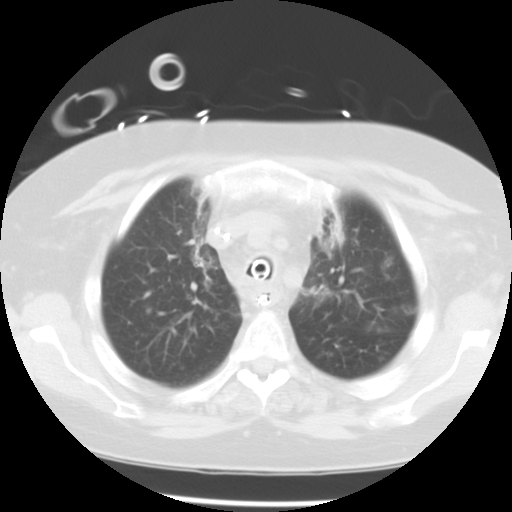
[im 59/67  mediastinal]
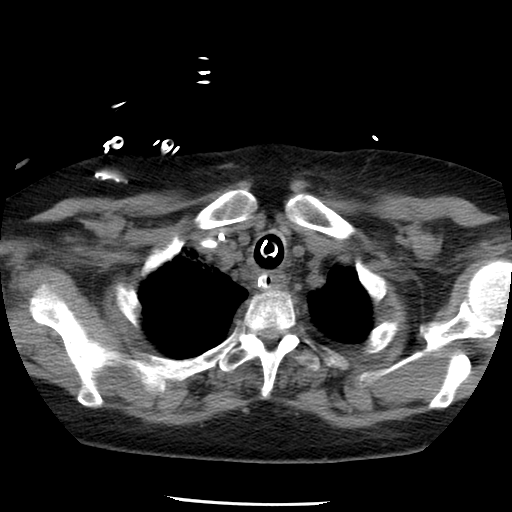
[im 59/67  lung]
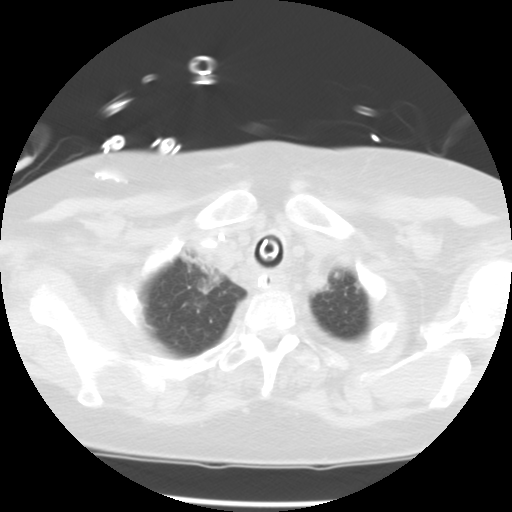

[Series 400: sagittals · sagittal · 0.66mm/px · 3 of 100 slices shown]
[im 20/100  lung]
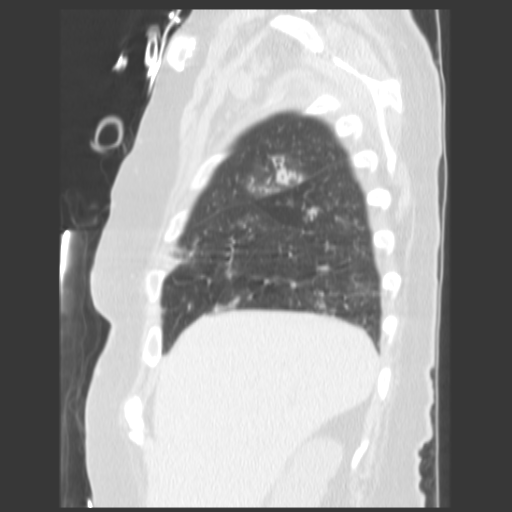
[im 40/100  lung]
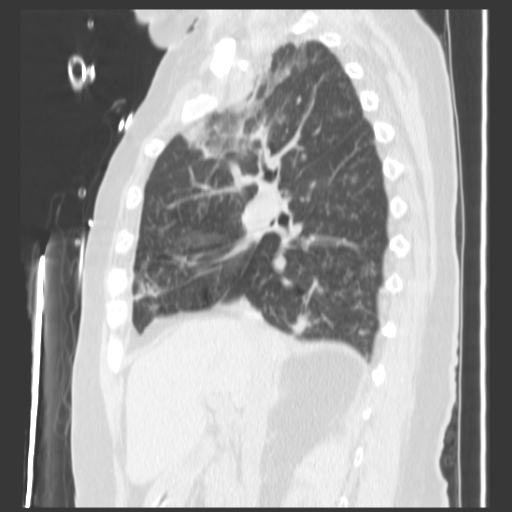
[im 60/100  lung]
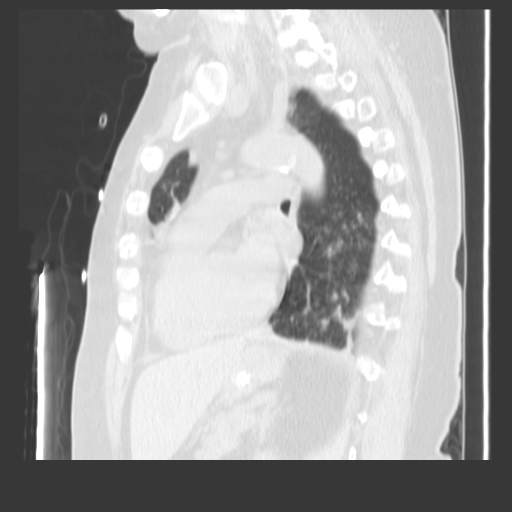

[Series 401: coronals · coronal · 0.66mm/px · 3 of 77 slices shown]
[im 8/77  lung]
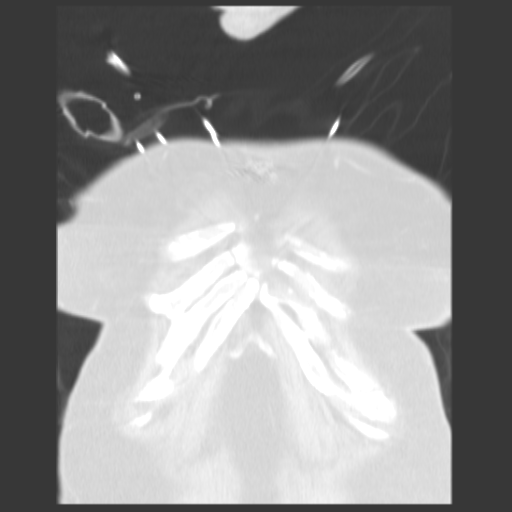
[im 16/77  lung]
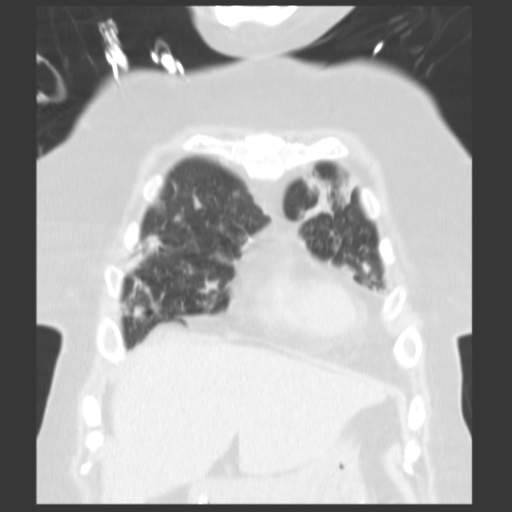
[im 23/77  lung]
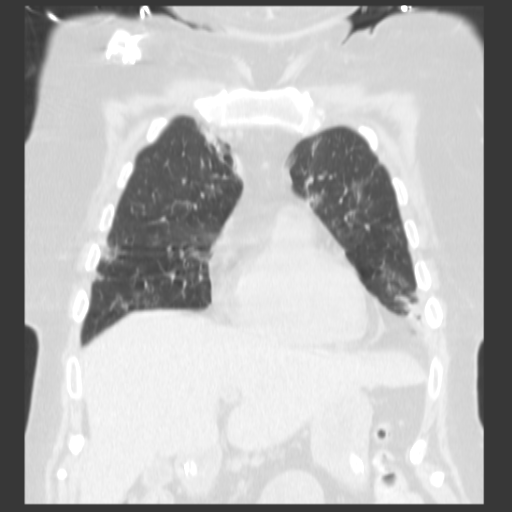

[15 of 35 positions shown; findings below may reference images not displayed]

FINDINGS: When compared to the prior chest CT there is any
significant change with interval development of multiple bilateral
patchy and cavitary lesions.  When compared to the limited imaging
of the lung bases from the recent abdominal CT, there has been a
change in the consolidation involving the lung bases with some
areas which appear less confluent but other areas which have
developed a cavitary lesions with the most distinct finding within
the left lung base where there is a 3 cm cavitary lesion with
central nodularity.  In a patient who is immunocompromised, this
raises possibility of atypical infection possibly fungal infection
and specifically, the possibility of invasive aspergillosis is
raised.  Baseline reticular nodular pattern throughout the lungs
may represent an infectious process.  Lymphangitic spread of
lymphoma cannot be completely excluded given this appearance.

Endotracheal tube is 1.5 cm above the carina and need to be watched
closely or retracted slightly to avoid mainstem bronchus intubation
with change of the patient's head position.  Right-sided Mediport
catheter tip proximal to mid superior vena cava.  Right central
line tip mid to distal superior vena cava.  Nasogastric tube tip
proximal duodenum.

Ill-defined adenopathy right paratracheal region.  Thickened
esophagus.

Left renal hilar mass may represent tumor and / or result of
treated tumor.

No bony destructive lesion.
IMPRESSION: Multiple cavitary lesions. In a patient who is immunocompromised,
this raises possibility of atypical infection possibly fungal
infection and specifically, the possibility of invasive
aspergillosis is raised.  Please see above discussion.

Ill-defined adenopathy right paratracheal region.

Thickened esophagus.

Endotracheal tube tip 1.5 cm above the carina as noted above.

Critical test results telephoned to Dr. Blain at the time of
interpretation on 11/16/2009 at [DATE] p.m.

## 2011-07-10 IMAGING — CR DG CHEST 1V PORT
1 series · 1 of 1 positions shown · non-contrast
Comparison: 11/16/2009

CLINICAL DATA: ET tube placement

PORTABLE CHEST - 1 VIEW

[view not recorded]
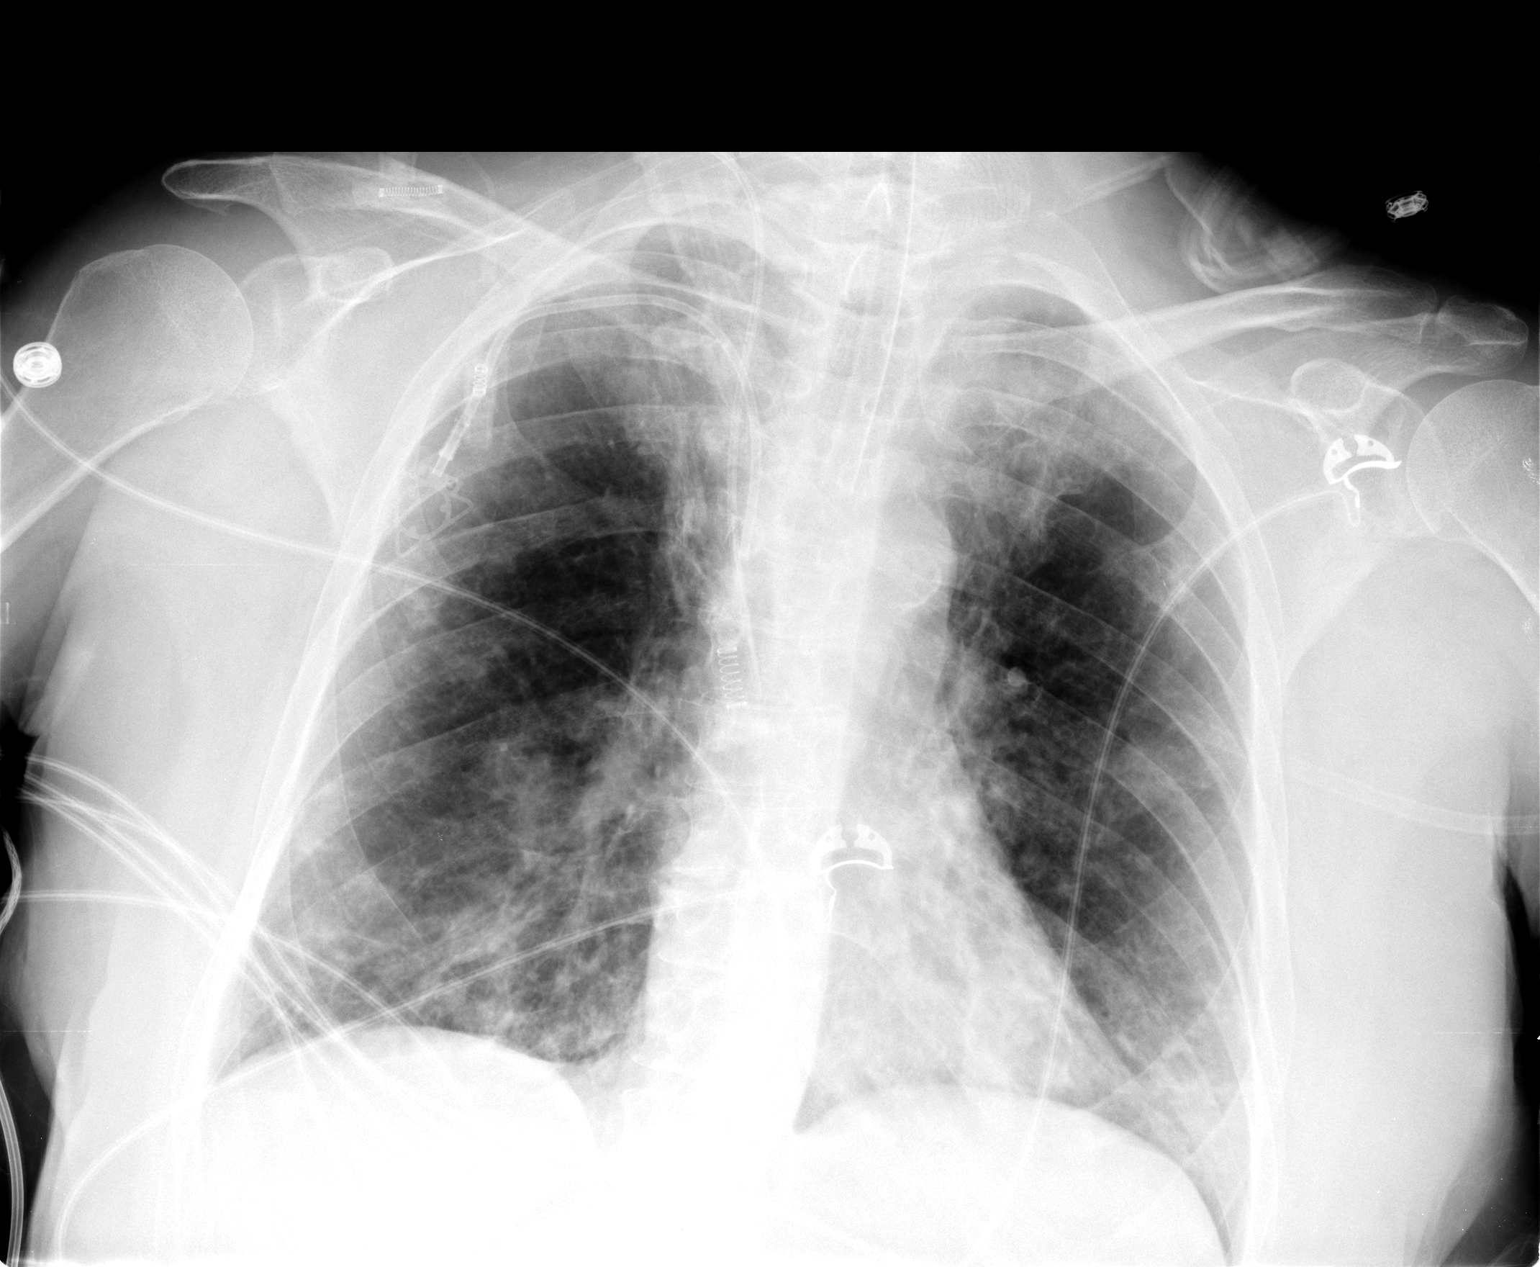

[1 of 1 positions shown; findings below may reference images not displayed]

FINDINGS: An endotracheal tube is in place with the tip
approximately 4.4 cm above the carina.  The right-sided internal
jugular subclavian catheters are unchanged.  The enteric tube has
been removed.  A cavitary lesion is again seen in the left lower
lobe although, the smaller lesions are better seen on recent chest
CT.  There is no interval change in the appearance of the chest.
The heart, upper abdomen and osseous structures are also unchanged.
IMPRESSION: 1.  Support apparatus as detailed above.
2.  No interval change in the appearance of the lungs as detailed
above.

## 2011-07-11 ENCOUNTER — Ambulatory Visit (HOSPITAL_COMMUNITY): Payer: BC Managed Care – PPO | Admitting: Oncology

## 2011-07-11 IMAGING — CR DG CHEST 1V PORT
1 series · 1 of 1 positions shown · non-contrast
Comparison: Today at 4544 hours

CLINICAL DATA: Post tracheostomy

PORTABLE CHEST - 1 VIEW

[view not recorded]
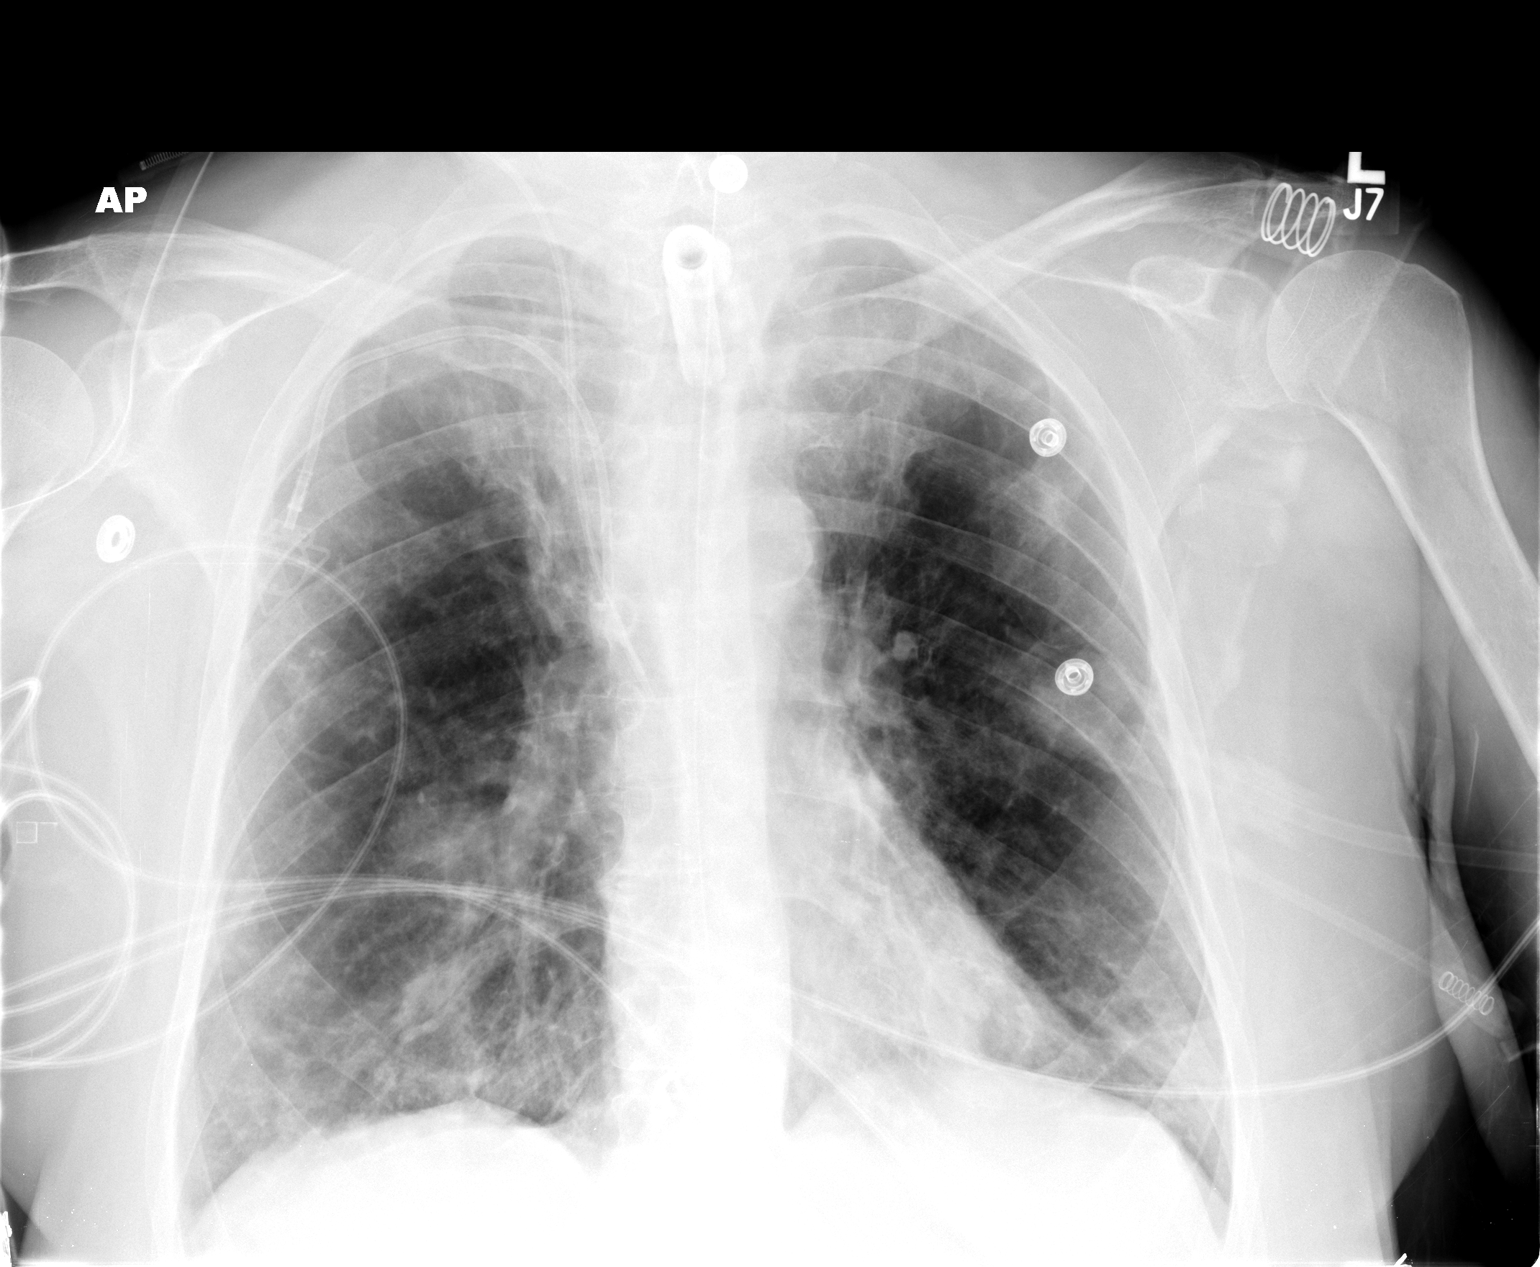

[1 of 1 positions shown; findings below may reference images not displayed]

FINDINGS: ET tube removed.  Post tracheostomy.  No obvious
complications.  Lungs and support apparatus unchanged otherwise.
IMPRESSION: Status post tracheostomy - no obvious complications or significant
interval change otherwise.

## 2011-07-11 NOTE — Telephone Encounter (Signed)
I spoke with Misty Stanley and she is wanting an order to continue seeing pt once a week to help take care of her picc line. Misty Stanley states she called Dr. Mariel Sleet office on Friday and they okay'd this. So she does not need anything further from [place].

## 2011-07-13 IMAGING — CR DG CHEST 1V PORT
1 series · 1 of 1 positions shown · non-contrast
Comparison: [HOSPITAL] chest x-ray 11/17/2009, 11/18/2009
and chest CT 11/16/2009.

CLINICAL DATA: Respiratory distress.

PORTABLE CHEST - 1 VIEW

[AP]
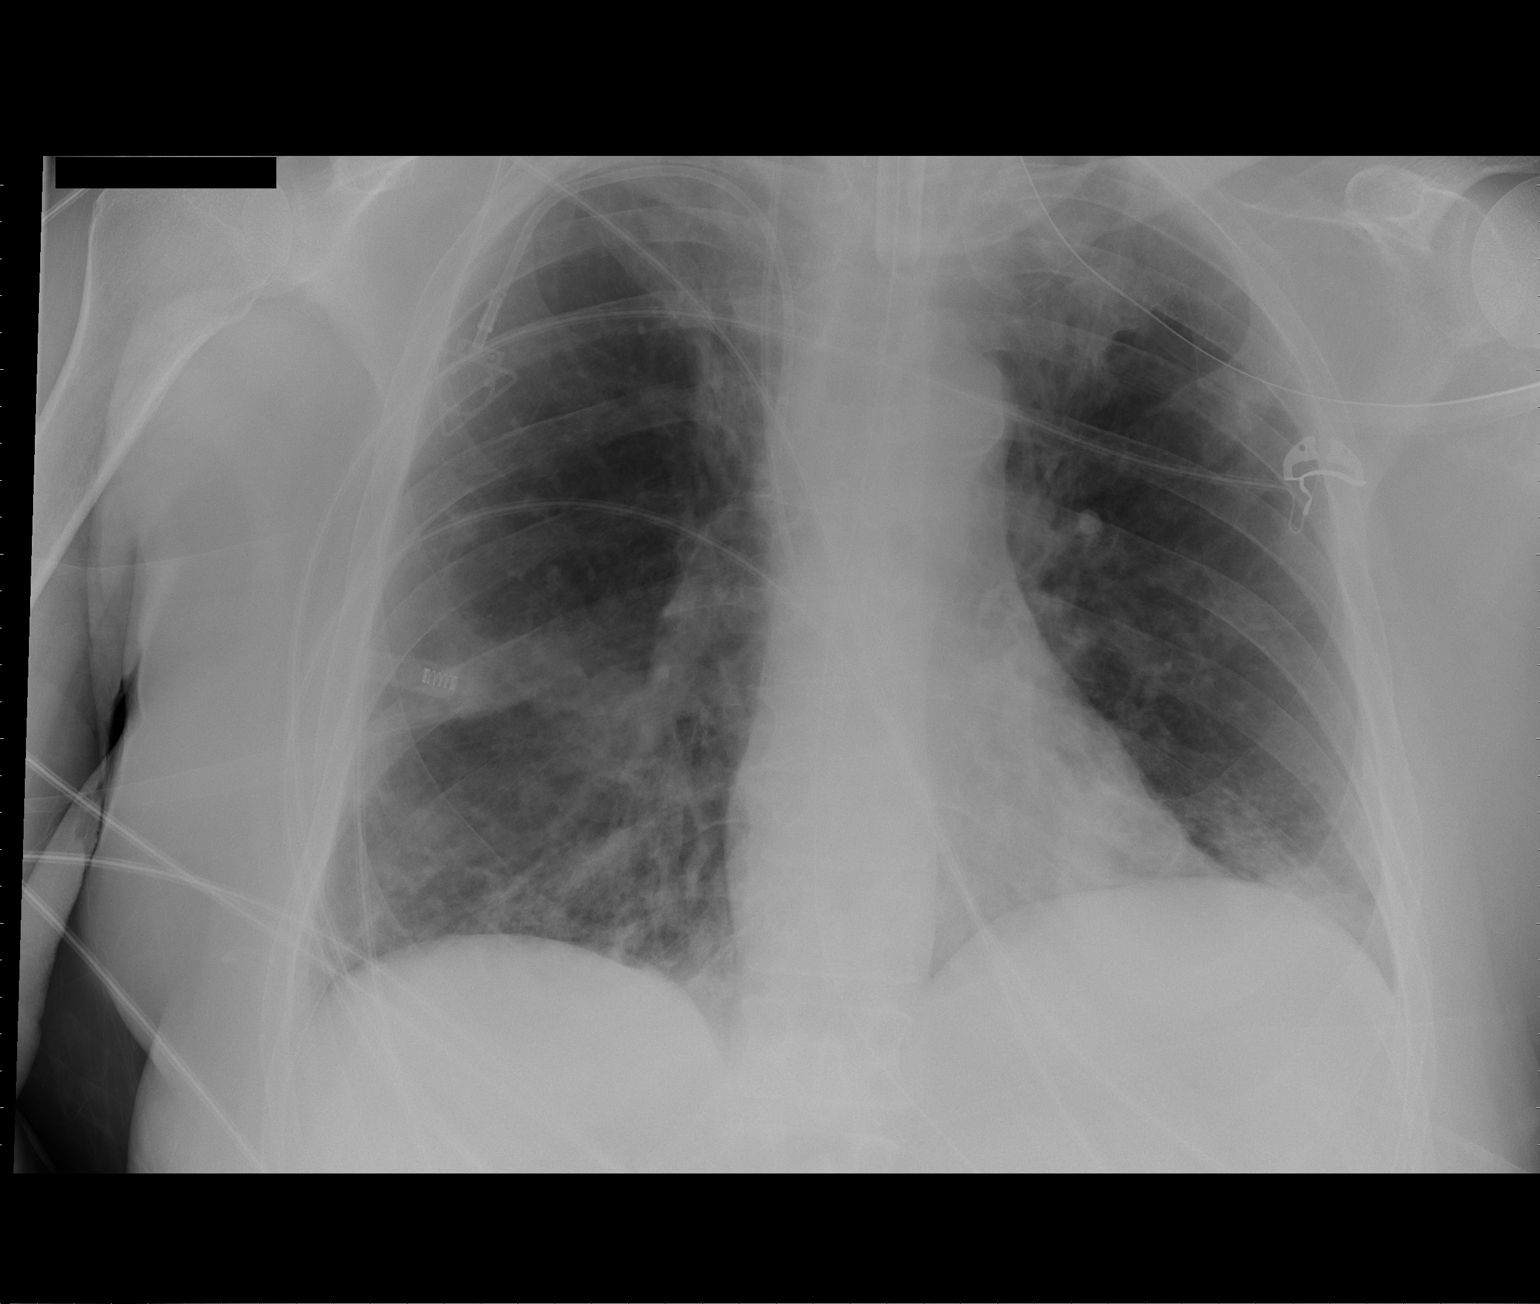

[1 of 1 positions shown; findings below may reference images not displayed]

FINDINGS: Since 11/18/2009 [DATE] hour stable satisfactory
tracheostomy tube position, right IJ and right subclavian central
venous catheter positions without pneumothorax.  Interval slight
elevation left hemidiaphragm with progressive left basilar
atelectatic/infiltrative change and stable ill-defined approximate
3.5 cm right lower lobe round opacity seen with patchy right
basilar atelectatic infiltrative finding.  Previous multiple
cavitary lesions chest CT 11/16/2009 less apparent on chest x-ray
modality.
.
IMPRESSION: 1.  Since 11/18/2009 [DATE] hour, stable satisfactory tracheostomy
tube and central venous catheter positions without pneumothorax.
2.  Interval progressive left basilar atelectatic/infiltrative
change with stable right lower lobe round opacity and right basilar
patchy infiltrate.  (Previous multiple cavitary lesions chest CT
less apparent on chest x-ray mode modality.  11/16/2009

## 2011-07-16 IMAGING — CR DG CHEST 1V PORT
1 series · 1 of 1 positions shown · non-contrast
Comparison: Multiple recent previous exams.

CLINICAL DATA: Sepsis and renal failure.

PORTABLE CHEST - 1 VIEW

[view not recorded]
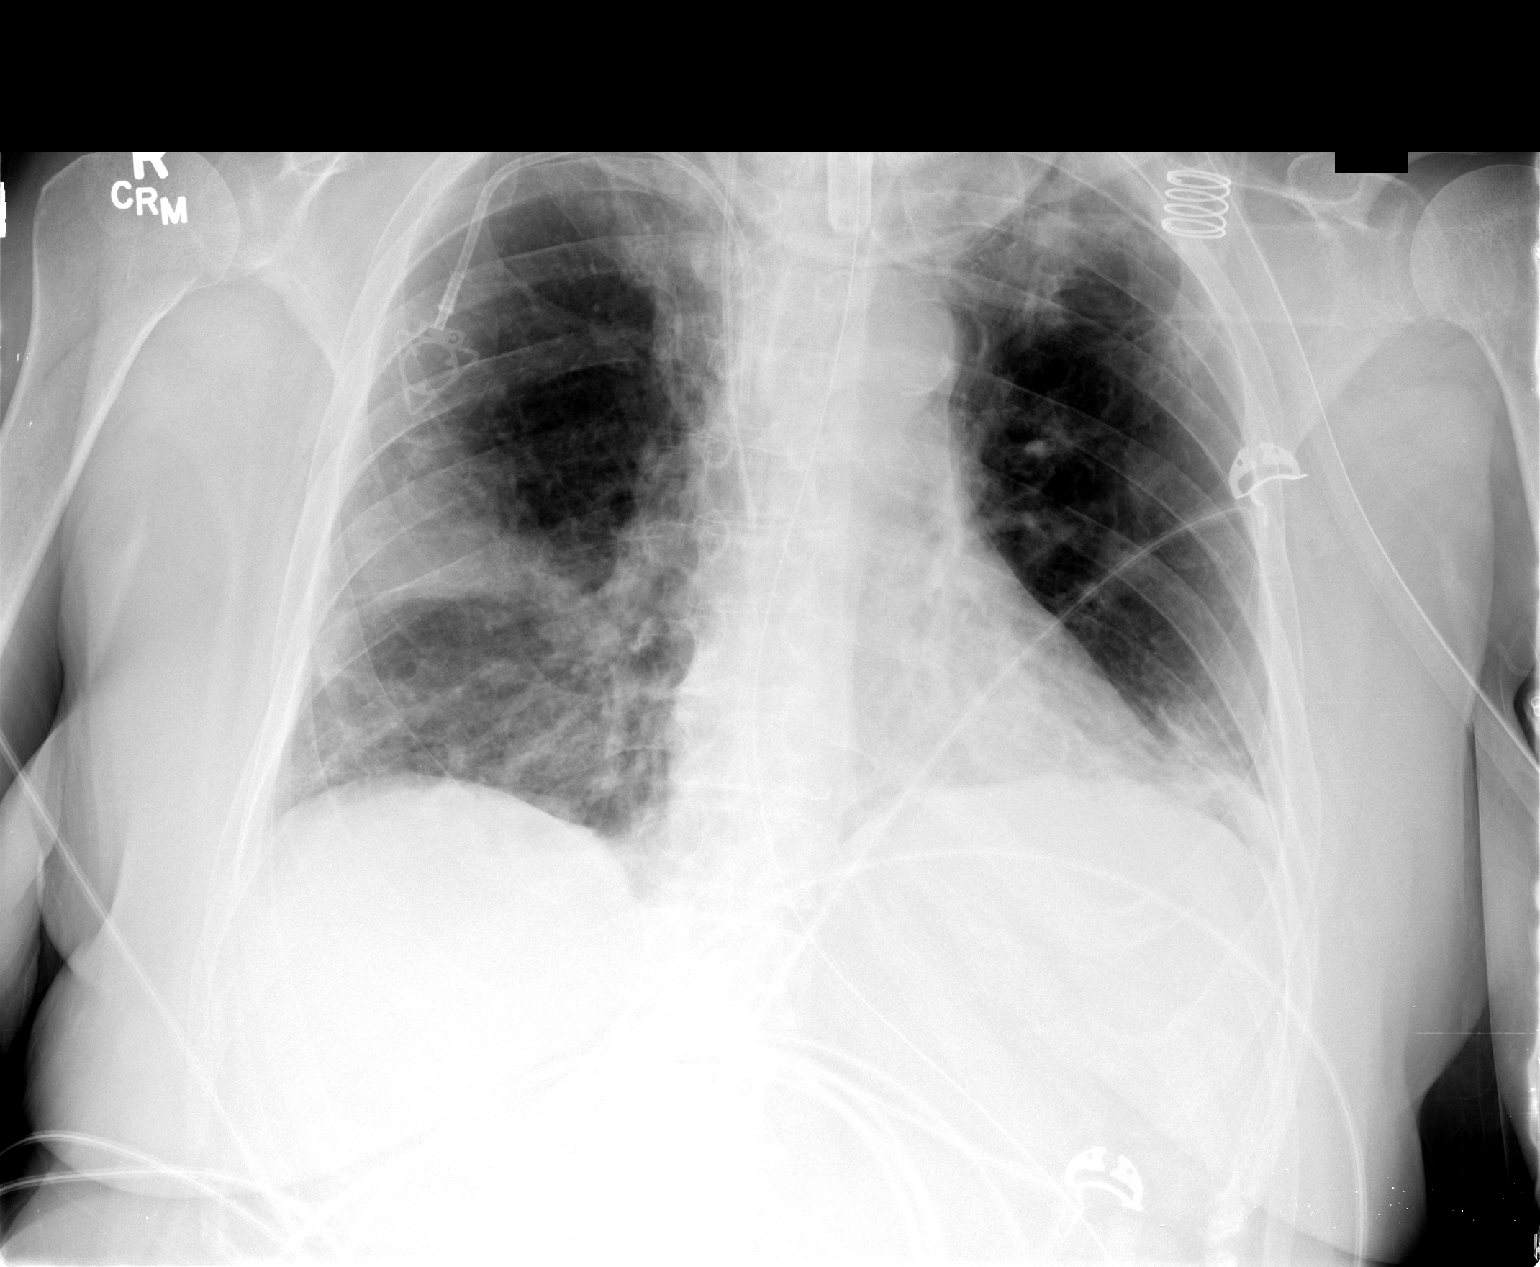

[1 of 1 positions shown; findings below may reference images not displayed]

FINDINGS: 0100 hours. Cardiopericardial silhouette is at upper
limits of normal for size.  The right midlung and left basilar
opacities persist without change.  Mild vascular congestion
superimposed on underlying interstitial chronic disease.
Tracheostomy tube noted. The NG tube passes into the stomach
although the distal tip position is not included on the film.
Right IJ central line tip projects at the mid SVC level.  Right
Port-A-Cath is stable.
IMPRESSION: No substantial interval change in exam.

## 2011-07-17 IMAGING — CR DG ABD PORTABLE 1V
1 series · 1 of 1 positions shown · non-contrast
Comparison: 11/06/2009

CLINICAL DATA: Sepsis

ABDOMEN - 1 VIEW

[AP]
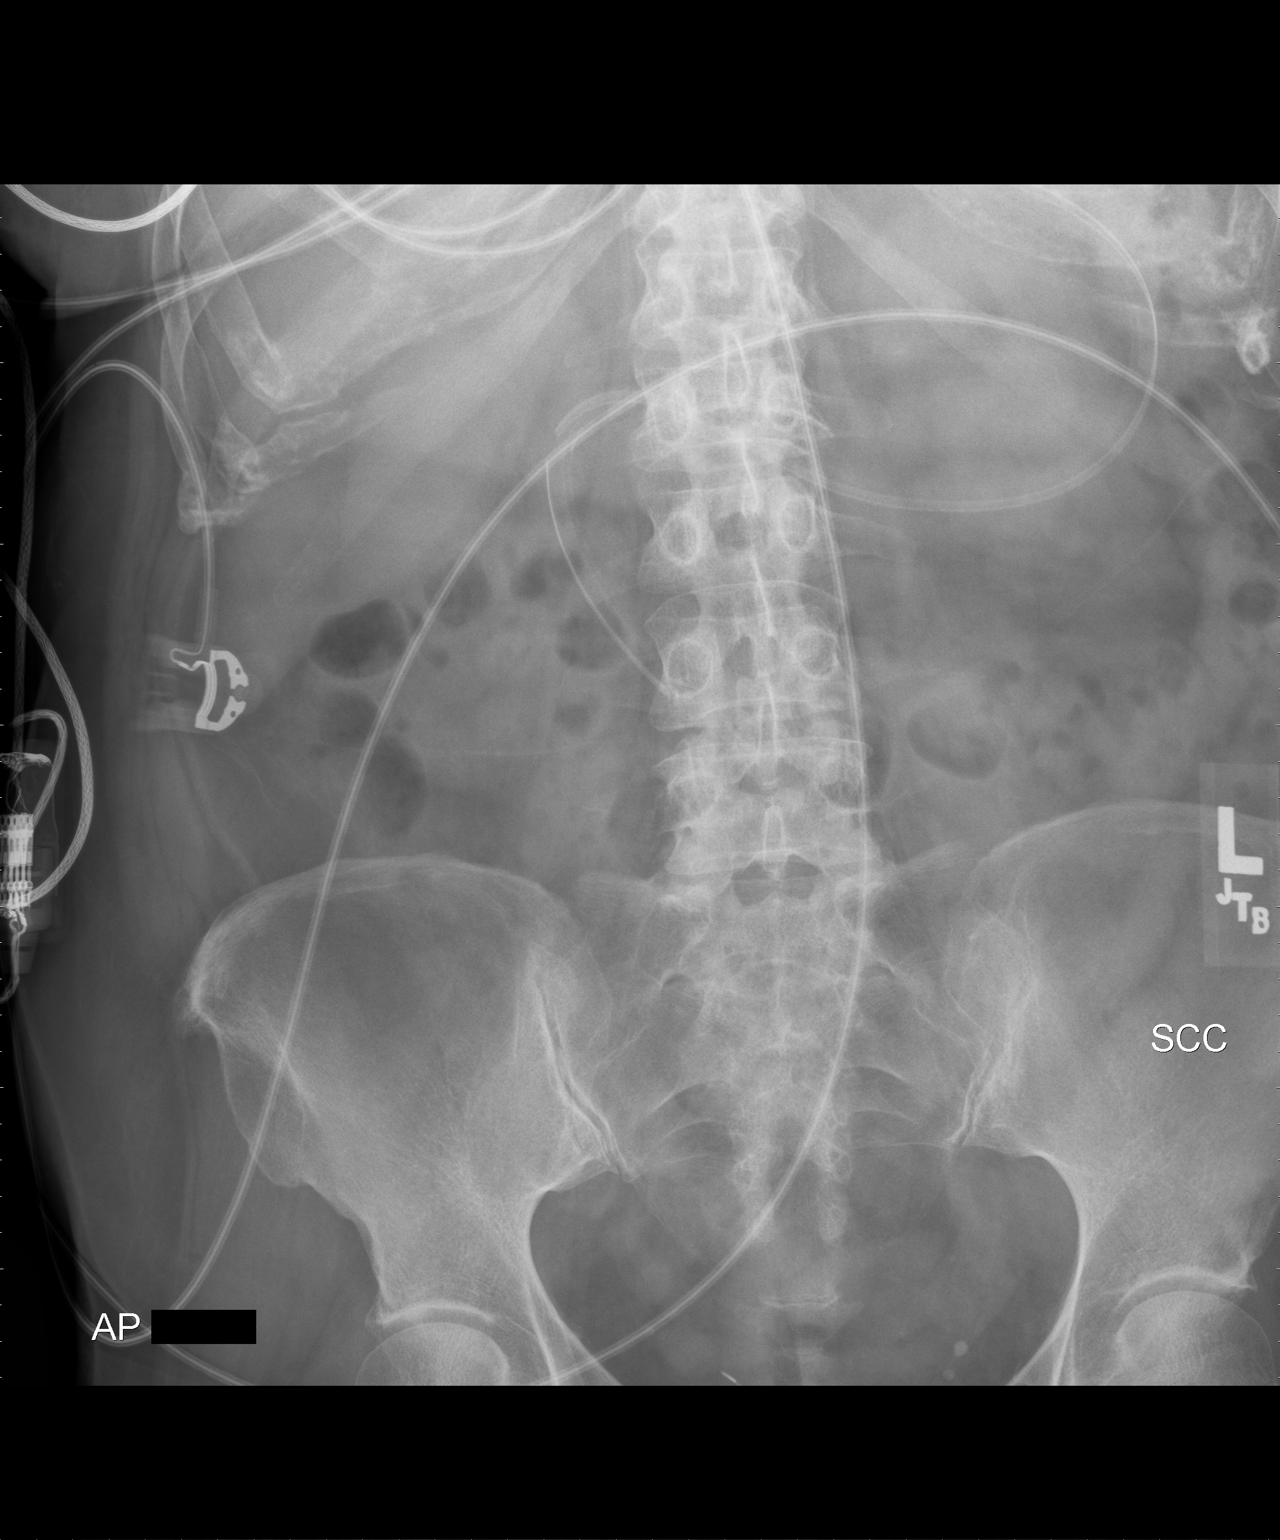

[1 of 1 positions shown; findings below may reference images not displayed]

FINDINGS: N G tube is in the duodenum.  No disproportionate
dilatation of bowel.  Stable bony framework.
IMPRESSION: NG tube tip in the duodenum.  Nonobstructive bowel gas pattern.

## 2011-07-17 IMAGING — CR DG CHEST 1V PORT
1 series · 1 of 1 positions shown · non-contrast
Comparison: 11/23/2009

CLINICAL DATA: Sepsis.  Renal failure.  Lymphoma.

PORTABLE CHEST - 1 VIEW

[AP]
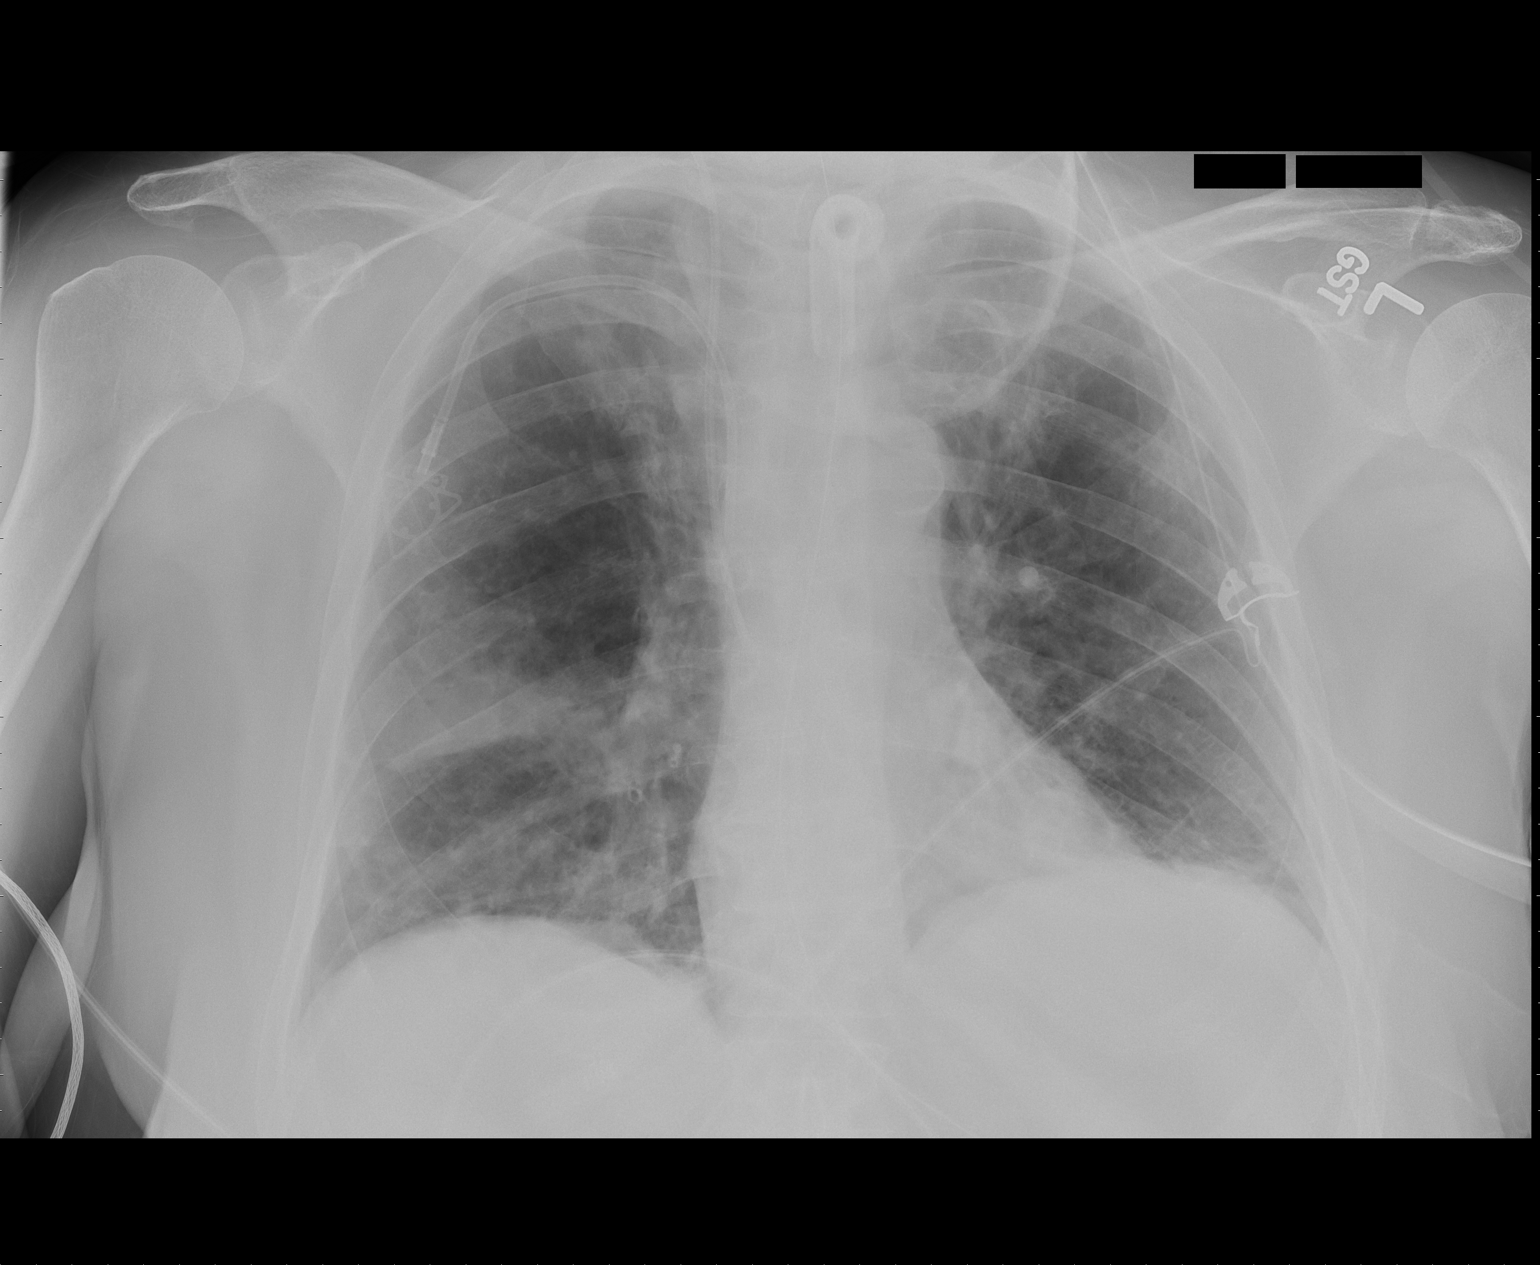

[1 of 1 positions shown; findings below may reference images not displayed]

FINDINGS: Areas of pulmonary opacity in the right perihilar region
and left lung base remains stable.  No new or worsening areas of
pulmonary opacity are identified.  Heart size is normal.  Support
apparatus in stable position.
IMPRESSION: No significant interval change.

## 2011-07-19 IMAGING — CR DG CHEST 1V PORT
1 series · 1 of 1 positions shown · non-contrast
Comparison: 11/22/2009

CLINICAL DATA: Sepsis, respiratory failure and renal failure.

PORTABLE CHEST - 1 VIEW

[view not recorded]
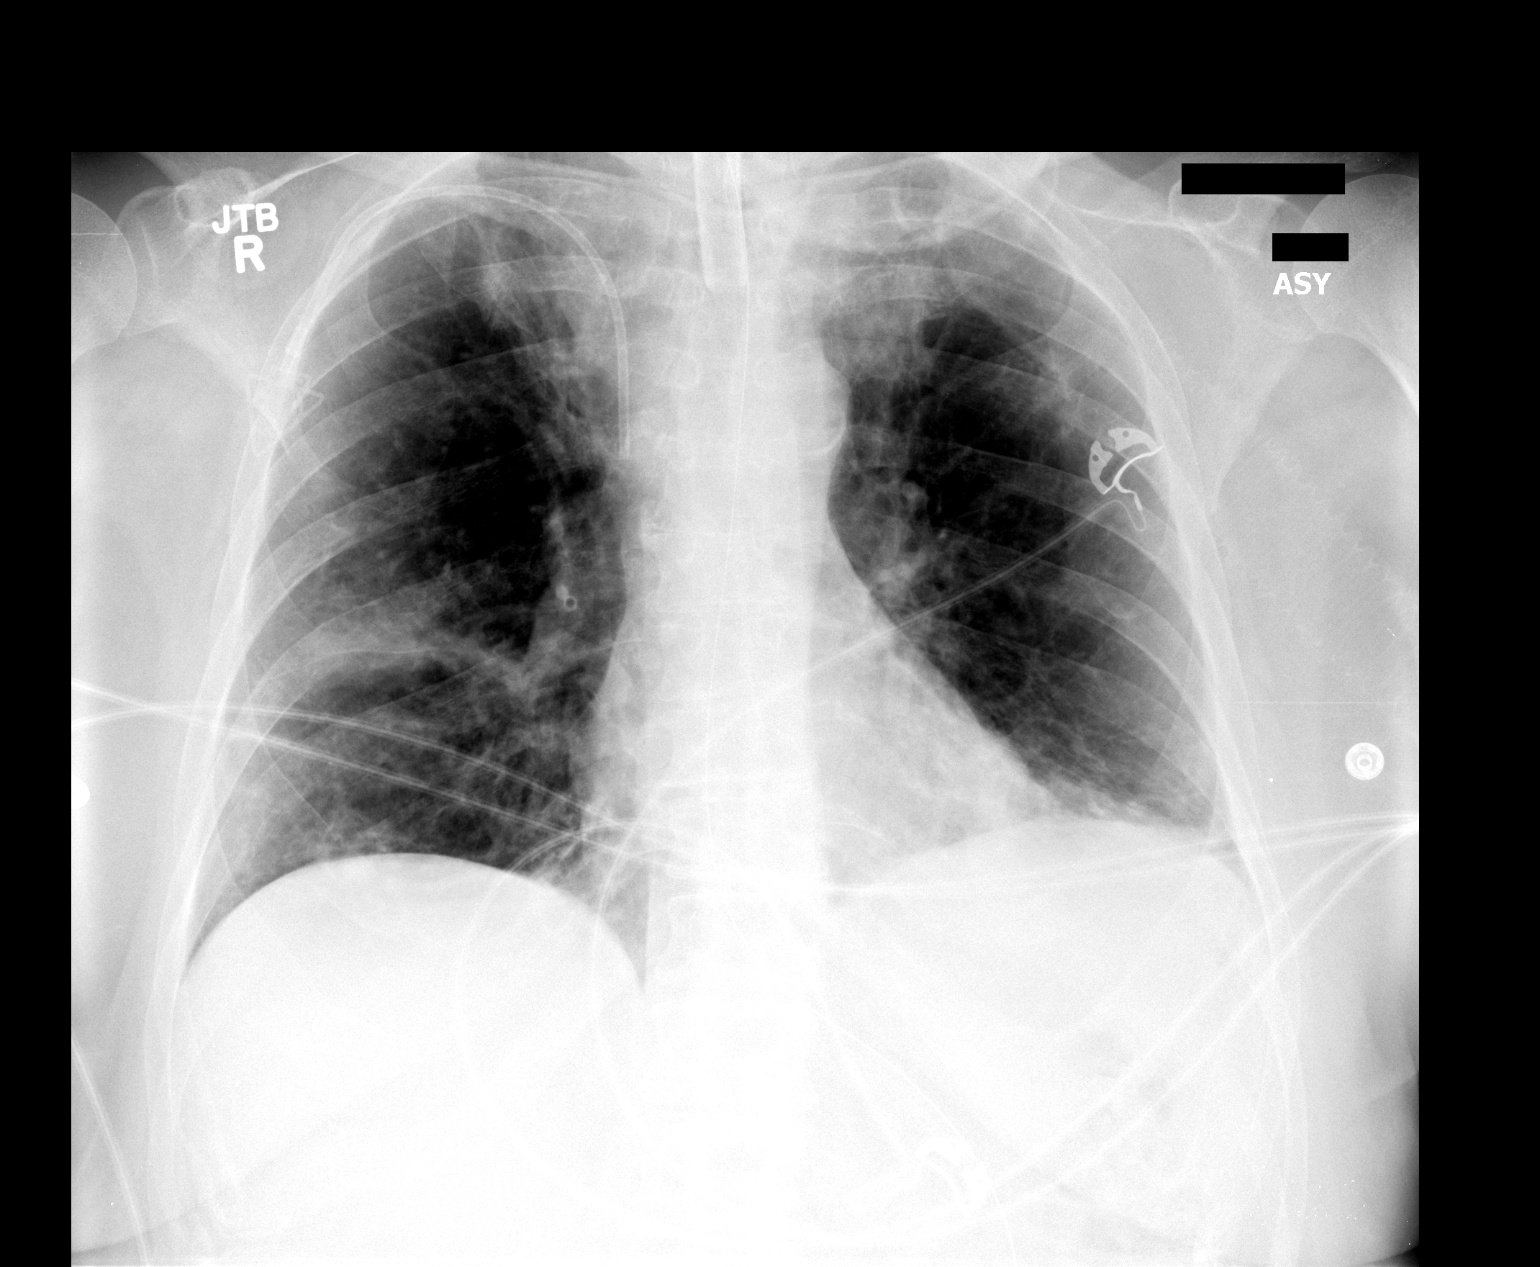

[1 of 1 positions shown; findings below may reference images not displayed]

FINDINGS: Stable tracheostomy and Port-A-Cath.  Right jugular
central line has been removed.  Lungs show improved aeration
bilaterally.  There remains some atelectasis in both lower lung
zones.  No edema, focal infiltrate or pleural fluid identified.
Heart size is normal.
IMPRESSION: Improved aeration bilaterally.

## 2011-07-21 IMAGING — CR DG CHEST 1V PORT
1 series · 1 of 1 positions shown · non-contrast
Comparison: 11/26/2009

CLINICAL DATA: Assess for consolidation

PORTABLE CHEST - 1 VIEW

[view not recorded]
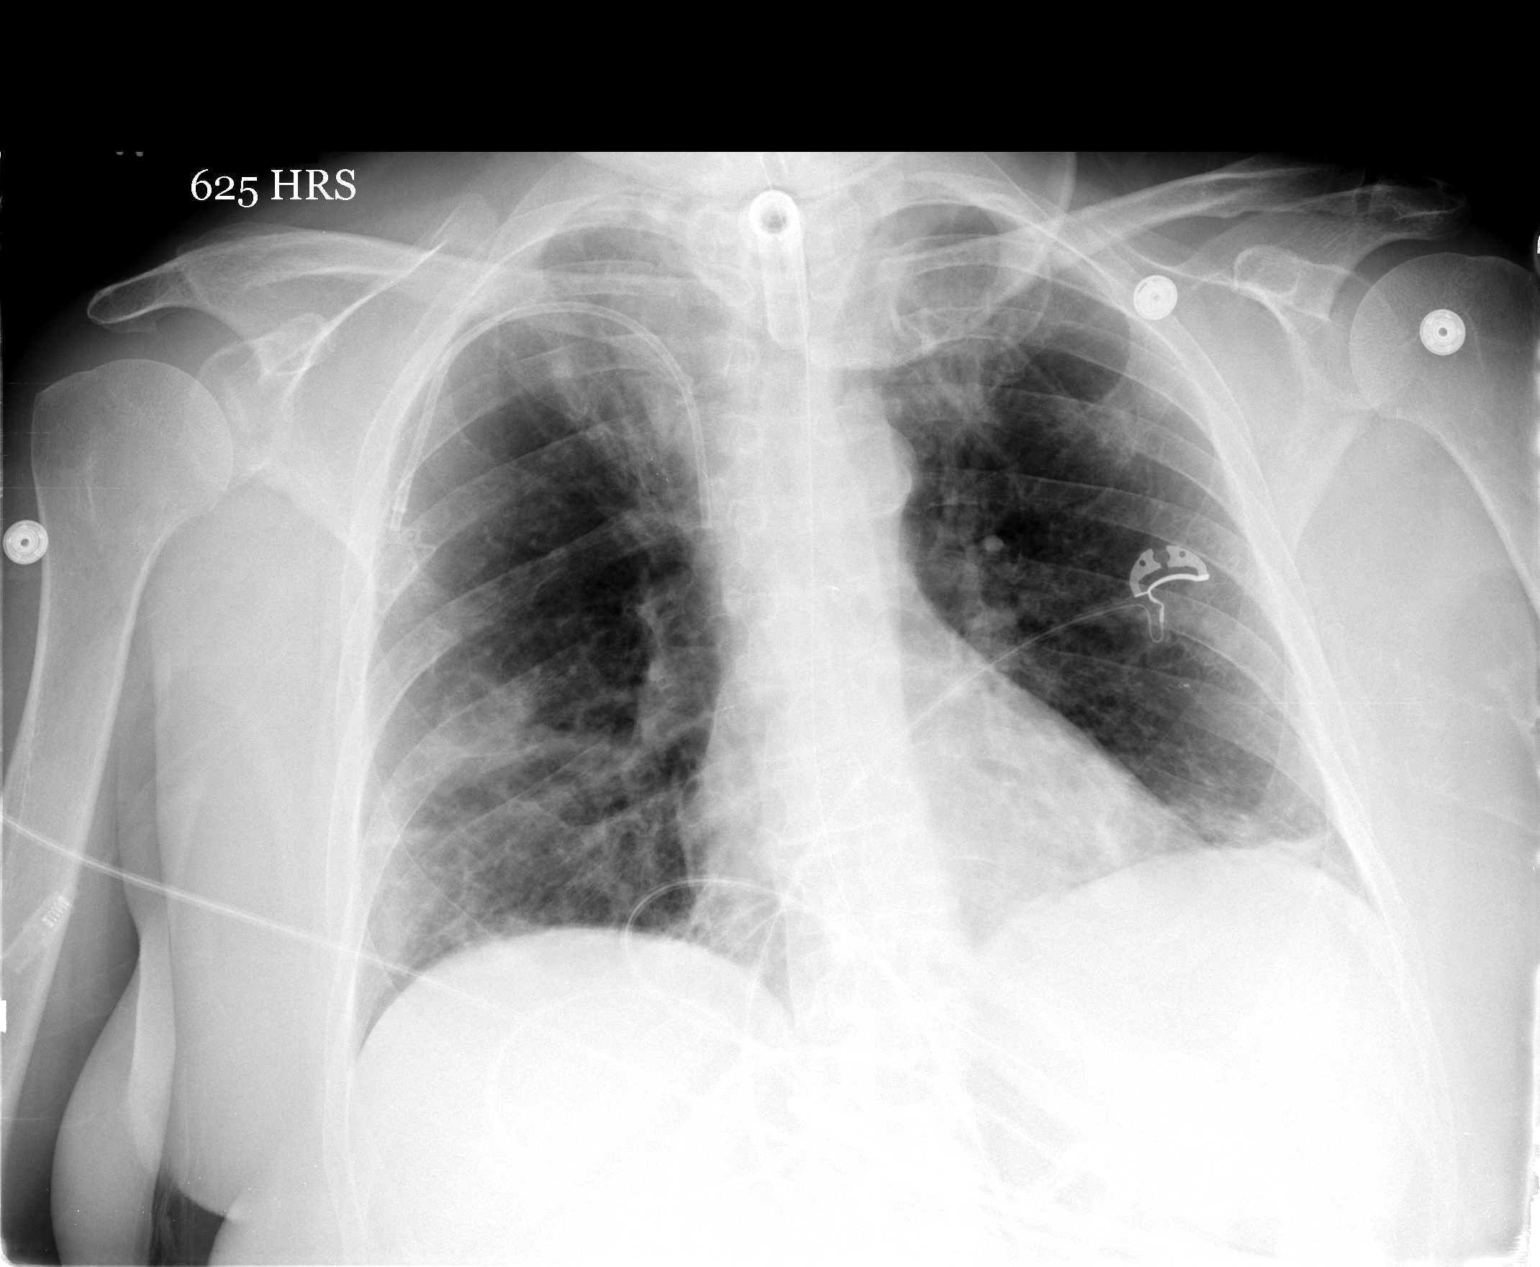

[1 of 1 positions shown; findings below may reference images not displayed]

FINDINGS: Tracheostomy tube, enteric tube and right-sided
subclavian Port-A-Cath are unchanged.  The lungs are similar
appearance with low volumes and linear areas of left base
atelectasis noted.  Patchy bilateral upper lobe and right mid lung
airspace opacities are again seen.  These are not significantly
changed compared to most recent exam although, have improved over
the course of the week.  The heart is unchanged in size and
contour.  The upper abdomen and osseous structures are unchanged.
Cervical spine fusion hardware is again noted.
IMPRESSION: 1.  Stable support apparatus.
2.  Patchy bilateral airspace disease as detailed above.

## 2011-07-25 IMAGING — CT CT CHEST W/O CM
3 series · 18 of 30 positions shown, 19 images · non-contrast
Comparison: 11/16/2009 and 07/07/2009

CLINICAL DATA: Evaluate for interval change of lung cavities.
History of sepsis, tracheostomy, lymphoma and renal failure.

CT CHEST WITHOUT CONTRAST
TECHNIQUE: Multidetector CT imaging of the chest was performed
following the standard protocol without IV contrast.

[Series 2: routine chest · axial · 0.70mm/px · z∈[-182,-52]mm · 4 of 53 slices shown, 5 images]
[im 14/53  mediastinal]
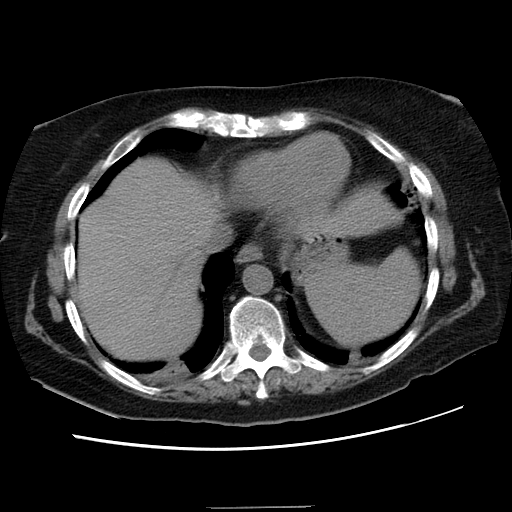
[im 14/53  lung]
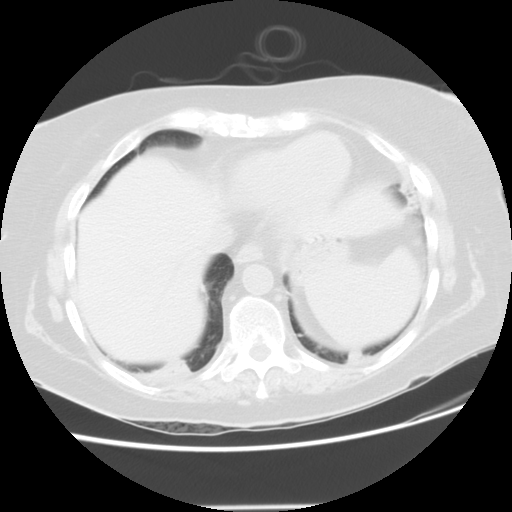
[im 27/53  lung]
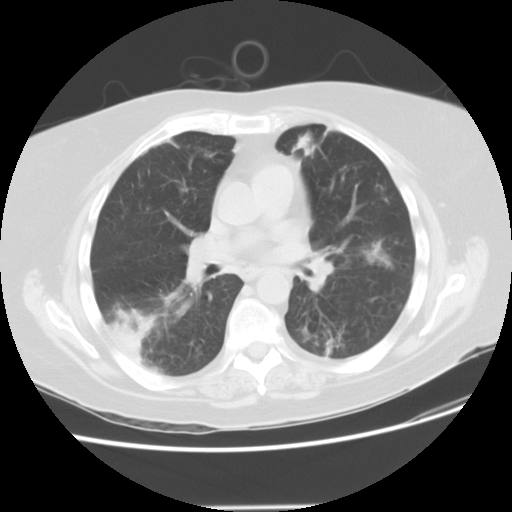
[im 31/53  lung]
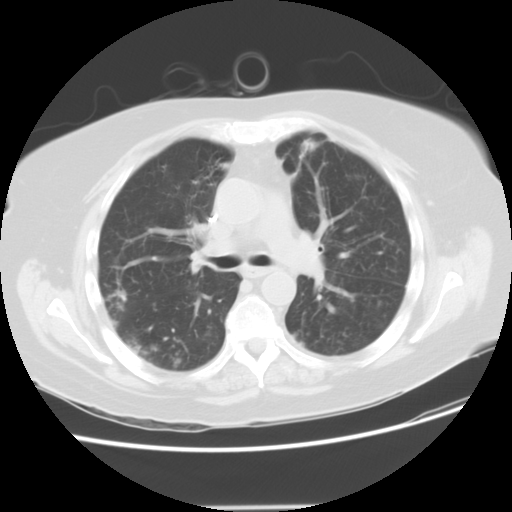
[im 40/53  lung]
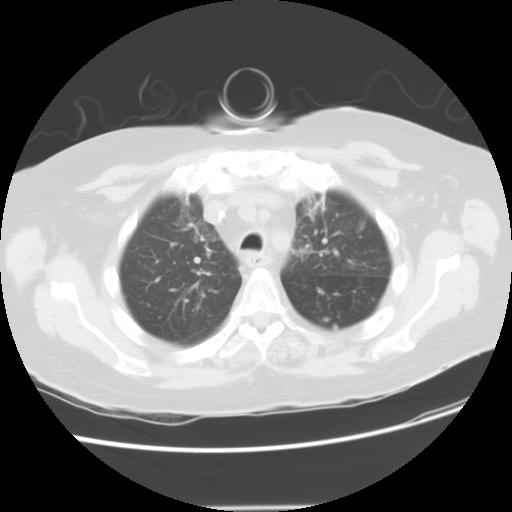

[Series 400: coronals · coronal · 0.70mm/px · 8 of 116 slices shown]
[im 12/116  lung]
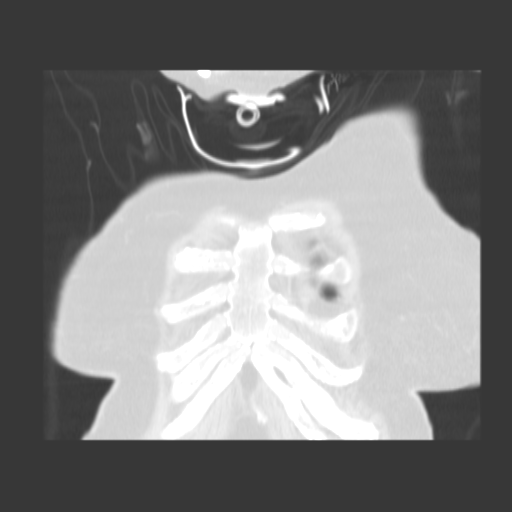
[im 24/116  lung]
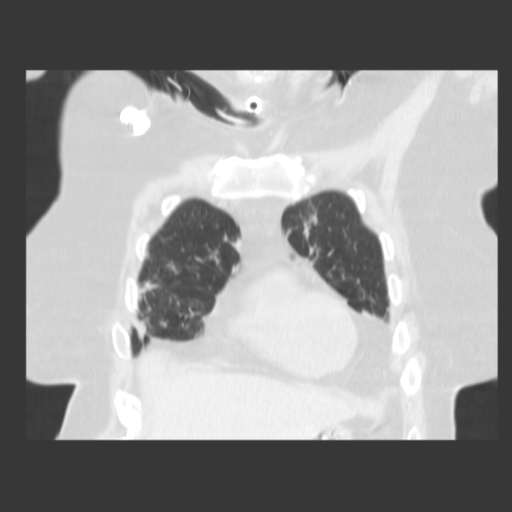
[im 35/116  lung]
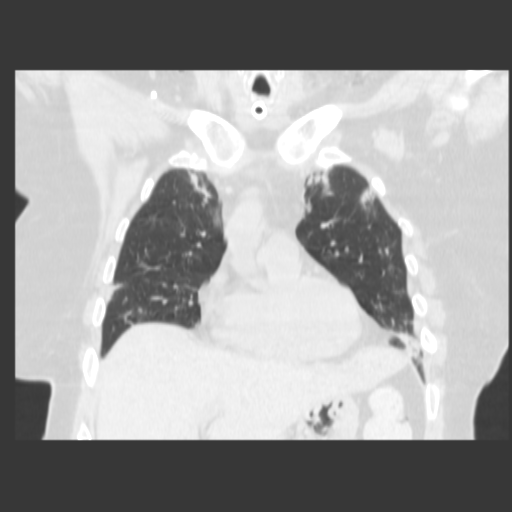
[im 47/116  lung]
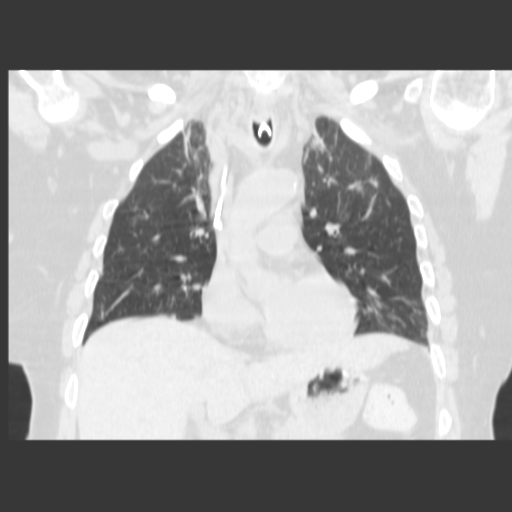
[im 70/116  lung]
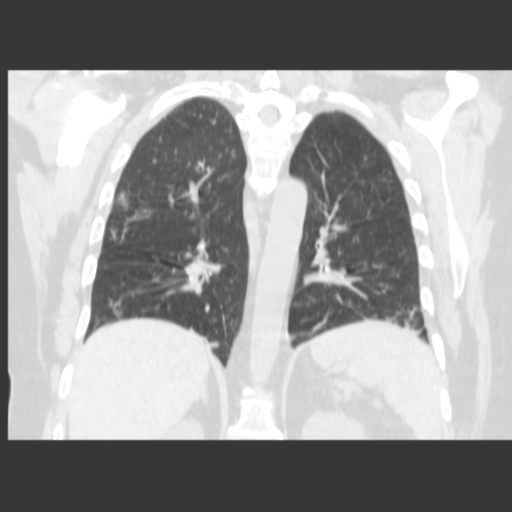
[im 81/116  lung]
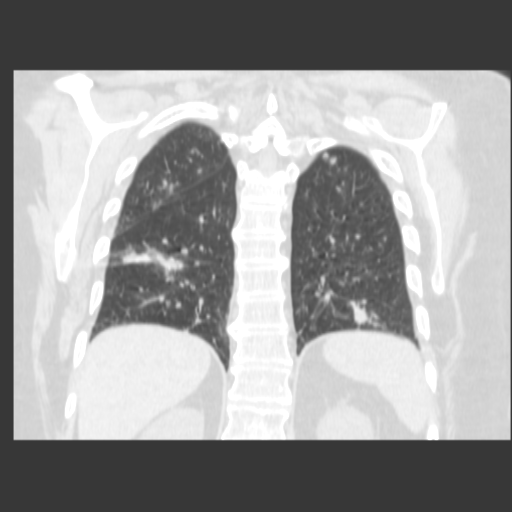
[im 93/116  lung]
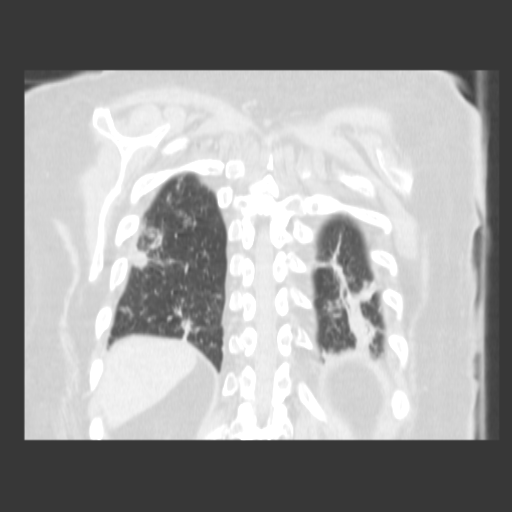
[im 104/116  lung]
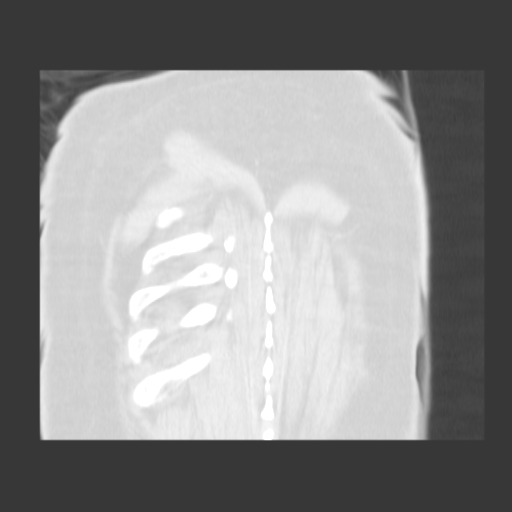

[Series 401: sagittals · sagittal · 0.70mm/px · 6 of 135 slices shown]
[im 13/135  lung]
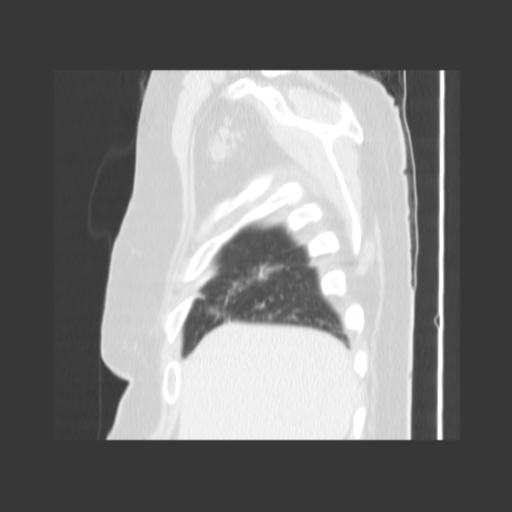
[im 37/135  lung]
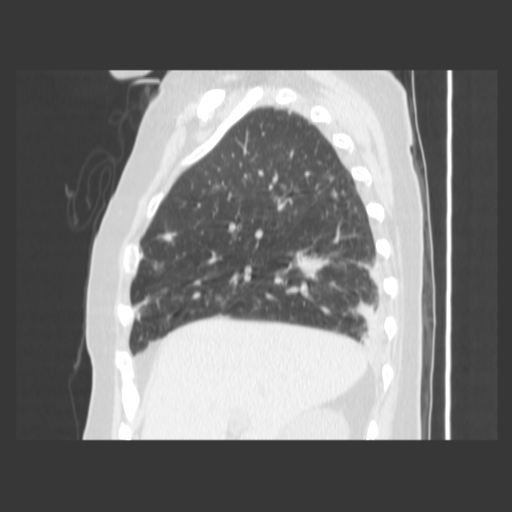
[im 49/135  lung]
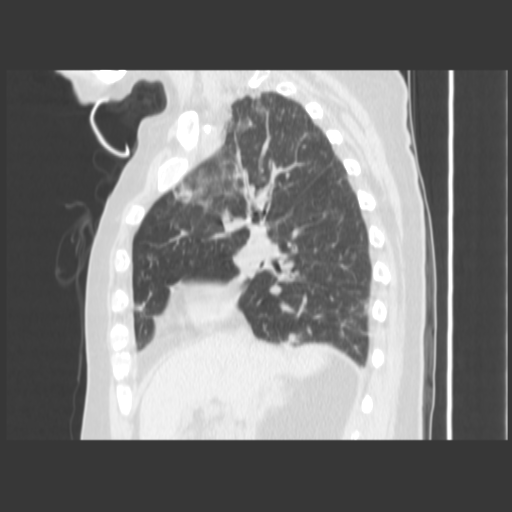
[im 61/135  lung]
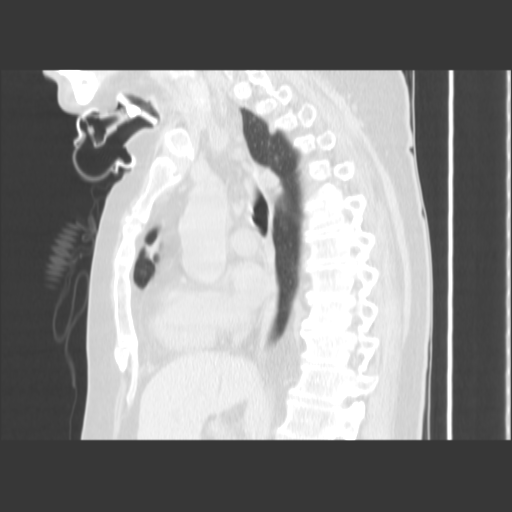
[im 74/135  lung]
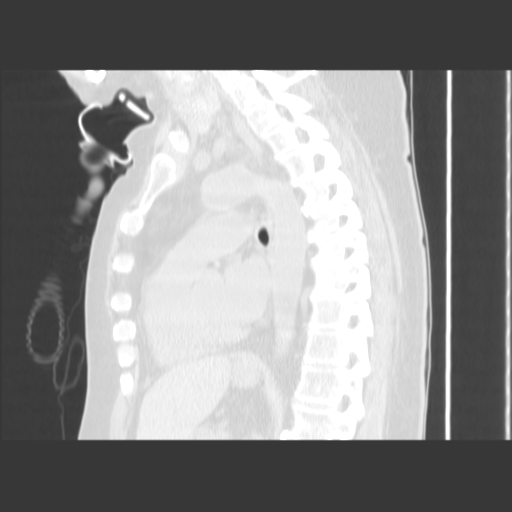
[im 86/135  lung]
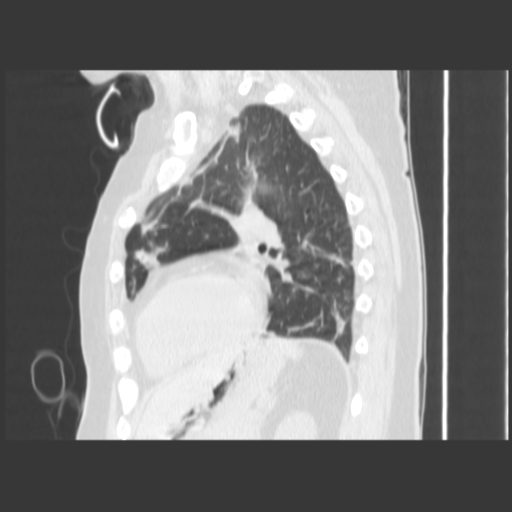

[18 of 30 positions shown; findings below may reference images not displayed]

FINDINGS: Tracheostomy is in place.  Mediastinal lymph nodes are
not enlarged by CT size criteria.  Hilar regions are difficult to
definitively evaluate without IV contrast.  No axillary adenopathy.
Heart size normal.  There is decreased attenuation of the
intravascular compartment, which is indicative of anemia.  No
pericardial effusion.

Trace right pleural fluid.  There has been interval improvement in
multi focal peribronchovascular airspace consolidation, micro
nodularity and cavitation.  Index cavitary lesion in the left lower
lobe measures 10 mm (previously 3 cm).  Airway is otherwise
unremarkable.

Incidental imaging of the upper abdomen shows no acute findings.
No worrisome lytic or sclerotic lesions.
IMPRESSION: 1.  Interval improvement in multi focal peribronchovascular
nodularity, consolidation and cavitation, without complete
resolution.  Findings are indicative of improving atypical or
fungal infectious process.
2.  Trace right pleural fluid.

## 2011-07-27 ENCOUNTER — Encounter (HOSPITAL_BASED_OUTPATIENT_CLINIC_OR_DEPARTMENT_OTHER): Payer: BC Managed Care – PPO

## 2011-07-27 ENCOUNTER — Telehealth (HOSPITAL_COMMUNITY): Payer: Self-pay | Admitting: *Deleted

## 2011-07-27 ENCOUNTER — Other Ambulatory Visit (HOSPITAL_COMMUNITY): Payer: Self-pay | Admitting: *Deleted

## 2011-07-27 DIAGNOSIS — C8589 Other specified types of non-Hodgkin lymphoma, extranodal and solid organ sites: Secondary | ICD-10-CM

## 2011-07-27 LAB — CBC WITH DIFFERENTIAL/PLATELET
Eosinophils Absolute: 0.1 10*3/uL (ref 0.0–0.7)
Hemoglobin: 12 g/dL (ref 12.0–15.0)
Lymphocytes Relative: 6 % — ABNORMAL LOW (ref 12–46)
Lymphs Abs: 0.5 10*3/uL — ABNORMAL LOW (ref 0.7–4.0)
MCH: 30.6 pg (ref 26.0–34.0)
MCV: 93.9 fL (ref 78.0–100.0)
Monocytes Relative: 6 % (ref 3–12)
Neutrophils Relative %: 86 % — ABNORMAL HIGH (ref 43–77)
RBC: 3.92 MIL/uL (ref 3.87–5.11)
WBC: 8 10*3/uL (ref 4.0–10.5)

## 2011-07-27 NOTE — Progress Notes (Signed)
Labs drawn today for cbc,immunofixation

## 2011-07-27 NOTE — Telephone Encounter (Signed)
Pt reports fever of 103 on Sunday, 100 on Monday and Tuesday in the afternoons. No fever yet today. Cough same as always with yellow sputum. No new symptoms. Coming this am for cbc diff and ife.

## 2011-07-28 ENCOUNTER — Telehealth (HOSPITAL_COMMUNITY): Payer: Self-pay | Admitting: *Deleted

## 2011-07-28 NOTE — Telephone Encounter (Signed)
Pt called to check on her labs, notified [facility] not back yet. She also wanted to let her know that her primary care Dr. Is putting her on zetia. They also told her that creatine was elevated and she should see a kidney dr. Jeanene Erb [facility] and they are faxing labs over to [place].

## 2011-07-29 ENCOUNTER — Telehealth (HOSPITAL_COMMUNITY): Payer: Self-pay

## 2011-07-29 LAB — IMMUNOFIXATION ELECTROPHORESIS
IgG (Immunoglobin G), Serum: 657 mg/dL — ABNORMAL LOW (ref 690–1700)
IgM, Serum: 24 mg/dL — ABNORMAL LOW (ref 52–322)

## 2011-07-29 NOTE — Telephone Encounter (Signed)
Per Lupita Leash, she completed the antibiotics about 2 - 3 weeks ago and will be seeing Dr. Delford Field on 08/05/11.

## 2011-08-03 ENCOUNTER — Emergency Department (HOSPITAL_COMMUNITY)
Admission: EM | Admit: 2011-08-03 | Discharge: 2011-08-03 | DRG: 419 | Disposition: A | Discharge status: Home / Self Care | Payer: BC Managed Care – PPO | Attending: Emergency Medicine | Admitting: Emergency Medicine

## 2011-08-03 ENCOUNTER — Encounter (HOSPITAL_COMMUNITY): Payer: Self-pay | Admitting: Emergency Medicine

## 2011-08-03 ENCOUNTER — Emergency Department (HOSPITAL_COMMUNITY): Payer: BC Managed Care – PPO

## 2011-08-03 ENCOUNTER — Telehealth (HOSPITAL_COMMUNITY): Payer: Self-pay | Admitting: *Deleted

## 2011-08-03 DIAGNOSIS — Z79899 Other long term (current) drug therapy: Secondary | ICD-10-CM

## 2011-08-03 DIAGNOSIS — J309 Allergic rhinitis, unspecified: Secondary | ICD-10-CM

## 2011-08-03 DIAGNOSIS — K219 Gastro-esophageal reflux disease without esophagitis: Secondary | ICD-10-CM

## 2011-08-03 DIAGNOSIS — D131 Benign neoplasm of stomach: Secondary | ICD-10-CM

## 2011-08-03 DIAGNOSIS — D801 Nonfamilial hypogammaglobulinemia: Secondary | ICD-10-CM | POA: Diagnosis present

## 2011-08-03 DIAGNOSIS — E785 Hyperlipidemia, unspecified: Secondary | ICD-10-CM

## 2011-08-03 DIAGNOSIS — R7309 Other abnormal glucose: Secondary | ICD-10-CM

## 2011-08-03 DIAGNOSIS — J45909 Unspecified asthma, uncomplicated: Secondary | ICD-10-CM | POA: Diagnosis present

## 2011-08-03 DIAGNOSIS — R509 Fever, unspecified: Secondary | ICD-10-CM | POA: Diagnosis present

## 2011-08-03 DIAGNOSIS — Z7901 Long term (current) use of anticoagulants: Secondary | ICD-10-CM

## 2011-08-03 DIAGNOSIS — J449 Chronic obstructive pulmonary disease, unspecified: Secondary | ICD-10-CM

## 2011-08-03 DIAGNOSIS — J383 Other diseases of vocal cords: Secondary | ICD-10-CM

## 2011-08-03 DIAGNOSIS — J329 Chronic sinusitis, unspecified: Secondary | ICD-10-CM

## 2011-08-03 DIAGNOSIS — C8589 Other specified types of non-Hodgkin lymphoma, extranodal and solid organ sites: Secondary | ICD-10-CM | POA: Diagnosis present

## 2011-08-03 DIAGNOSIS — I10 Essential (primary) hypertension: Secondary | ICD-10-CM

## 2011-08-03 LAB — URINE MICROSCOPIC-ADD ON

## 2011-08-03 LAB — URINALYSIS, ROUTINE W REFLEX MICROSCOPIC
Glucose, UA: NEGATIVE mg/dL
Ketones, ur: NEGATIVE mg/dL
Leukocytes, UA: NEGATIVE
Protein, ur: NEGATIVE mg/dL

## 2011-08-03 LAB — CBC WITH DIFFERENTIAL/PLATELET
Basophils Absolute: 0 10*3/uL (ref 0.0–0.1)
Basophils Relative: 0 % (ref 0–1)
HCT: 34.2 % — ABNORMAL LOW (ref 36.0–46.0)
Lymphocytes Relative: 5 % — ABNORMAL LOW (ref 12–46)
Monocytes Absolute: 0.8 10*3/uL (ref 0.1–1.0)
Neutro Abs: 14.6 10*3/uL — ABNORMAL HIGH (ref 1.7–7.7)
Neutrophils Relative %: 90 % — ABNORMAL HIGH (ref 43–77)
Platelets: 233 10*3/uL (ref 150–400)
RDW: 14.3 % (ref 11.5–15.5)
WBC: 16.3 10*3/uL — ABNORMAL HIGH (ref 4.0–10.5)

## 2011-08-03 LAB — BASIC METABOLIC PANEL
CO2: 27 mEq/L (ref 19–32)
Calcium: 9.1 mg/dL (ref 8.4–10.5)
Creatinine, Ser: 1.05 mg/dL (ref 0.50–1.10)

## 2011-08-03 MED ORDER — IPRATROPIUM BROMIDE 0.02 % IN SOLN
0.5000 mg | Freq: Once | RESPIRATORY_TRACT | Status: AC
  Administered 2011-08-03: 0.5 mg via RESPIRATORY_TRACT
  Filled 2011-08-03: qty 2.5

## 2011-08-03 MED ORDER — CEFTRIAXONE SODIUM 1 G IJ SOLR
1.0000 g | Freq: Once | INTRAMUSCULAR | Status: AC
  Administered 2011-08-03: 1 g via INTRAVENOUS
  Filled 2011-08-03: qty 10

## 2011-08-03 MED ORDER — ALBUTEROL SULFATE (5 MG/ML) 0.5% IN NEBU
2.5000 mg | INHALATION_SOLUTION | Freq: Once | RESPIRATORY_TRACT | Status: AC
  Administered 2011-08-03: 2.5 mg via RESPIRATORY_TRACT
  Filled 2011-08-03: qty 0.5

## 2011-08-03 MED ORDER — ACETAMINOPHEN 500 MG PO TABS: 500.0000 mg | ORAL_TABLET | Freq: Four times a day (QID) | ORAL | Status: AC | PRN

## 2011-08-03 MED ORDER — CIPROFLOXACIN HCL 500 MG PO TABS: 500.0000 mg | ORAL_TABLET | Freq: Two times a day (BID) | ORAL | Status: AC

## 2011-08-03 MED ORDER — ACETAMINOPHEN 325 MG PO TABS
650.0000 mg | ORAL_TABLET | Freq: Once | ORAL | Status: AC
  Administered 2011-08-03: 650 mg via ORAL
  Filled 2011-08-03: qty 2

## 2011-08-03 NOTE — ED Notes (Addendum)
Pt cancer pt, fever x 1 week. PICC line placed in May to r upper arm. C/o sob started today. Dry cough x 4 days. Denies n/v/d. Denies gu sx's. Color wnl. 400mg  ibuprofen taken ago.

## 2011-08-03 NOTE — Consult Note (Signed)
[facility] Medical Consultation  [name] OZH:[id number] DOB: Aug 07, 1955 DOA: 08/03/2011 PCP: Kirk Ruths, MD   Requesting physician: [name] Booze, MD Date of consultation: 08/03/2011 Reason for consultation: Fever  Impression/Recommendations [name] is a 56 yo with [place] Lymphoma who for the past week has been having intermittent fevers. Fevers happen daily, no specific time correlation, associated with chills. Have been as high as 103, no associated symptoms indicating a primary source of infection, other than fevers she looks and feels well. Her CXR shows nothing acute, she has asthma with chronic cough buit no worse than her baseline, urine has a few WBC, but generally she is asymptomatic. Notable she does have a PICC line which raises concerns for a bacteremia, blood cultures have been drawn, her blood pressure is mildly low normal (she tends to run low at baseline). No elevation in WBC, now afebrile, temp spike to 102, came down with Tylenol. At this junction based on her clinical picture I think it is safe for her to go home and follow-up with Dr. Mariel [name] in AM. I suspect fevers are from her lymphoma, she is due to resume treatment soon. Either her PCP or Oncology will need to f/u on blood culture results this week. The patient is requesting discharge home from ED tonight, I think this is a reasonable request since no source of infection has been found and her fever has resolved.  Recommendations:  1. Discharge Home with close outpatient f/u with Dr. Mariel [name] 2. Will empirically treat for UTI (7-11 WBC on UA) with CiproX3 days 3. Tylenol for fever 4. F/U Blood culture results  Chief Complaint: Fever  HPI:  56 yo with hx of Non-Hodgkin's lymphoma presented to Ed c/o fever on recommendation of [name] office. Fevers have been intermittent for over 1 week, associated with chills, no sick contacts, no congestion or respiratory symptoms, she has a baseline dry  hacking cough and asthma symptoms at baseline, no dysuria. She has a PICC but no drainage at PICC site, no rashes, no sore throat or HA. Generally she reports feeling very well. She is due to resume chemotherapy next month.  Review of Systems:  Review of Systems  Constitutional: Positive for fever and chills. Negative for weight loss, malaise/fatigue and diaphoresis.  HENT: Negative for congestion and sore throat.   Respiratory: Positive for cough. Negative for hemoptysis, sputum production, shortness of breath and wheezing.   Cardiovascular: Negative for chest pain, orthopnea and leg swelling.  Gastrointestinal: Negative for heartburn, nausea, vomiting and abdominal pain.  Genitourinary: Positive for urgency. Negative for dysuria, frequency, hematuria and flank pain.  Musculoskeletal: Negative for myalgias.  Skin: Negative for rash.  Neurological: Negative for dizziness, weakness and headaches.  Psychiatric/Behavioral: Negative for depression. The patient is not nervous/anxious.   All other systems reviewed and are negative.    Past Medical History  Diagnosis Date  . Non Hodgkin's lymphoma   . Allergic rhinitis   . GERD (gastroesophageal reflux disease)   . Asthma   . Chronic sinusitis   . Respiratory failure   . Cavitary lung disease    Past Surgical History  Procedure Date  . Vesicovaginal fistula closure w/ tah   . Nasal sinus surgery   . Neck surgery     x 2   . Breast surgery   . Portacath placement 7/11    Removed 6/12  . Basal cell carcinoma excision 03/2011    scalp   Social History:  reports that she has never  smoked. She has never used smokeless tobacco. She reports that she does not drink alcohol. Her drug history not on file.  Allergies  Allergen Reactions  . Meperidine Hcl Anaphylaxis  . Montelukast Sodium Hives and Rash   Family History  Problem Relation Age of Onset  . Emphysema Mother   . Allergies Father   . Asthma Father     as a child  .  Leukemia Maternal Grandmother   . Diabetes Brother   . Stroke Mother     Prior to Admission medications   Medication Sig Start Date End Date Taking? Authorizing Provider  ACCU-CHEK AVIVA PLUS test strip 1 each by Other route 2 (two) times a week.    Yes Historical Provider, MD  acyclovir (ZOVIRAX) 200 MG capsule 2 capsules twice per day    Yes Historical Provider, MD  albuterol (PROVENTIL) (2.5 MG/3ML) 0.083% nebulizer solution Take 2.5 mg by nebulization every 4 (four) hours as needed. 09/15/10  Yes Storm Frisk, MD  albuterol (VENTOLIN HFA) 108 (90 BASE) MCG/ACT inhaler Inhale 2 puffs into the lungs every 6 (six) hours as needed. 03/28/11  Yes Storm Frisk, MD  benzonatate (TESSALON) 200 MG capsule Take 1 capsule by mouth Three times daily as needed. 05/11/11  Yes Historical Provider, MD  citalopram (CELEXA) 20 MG tablet Take 20 mg by mouth every morning.     Yes Historical Provider, MD  EPINEPHrine (EPIPEN IJ) As needed for severe allergic reaction    Yes Historical Provider, MD  furosemide (LASIX) 20 MG tablet Take 1 tablet by mouth every other day. *Usually takes as needed for fluid retention upon weighing in**   Yes Historical Provider, MD  Hydrocod Polst-Chlorphen Polst (TUSSICAPS) 10-8 MG CP12 Take 1 capsule by mouth 2 (two) times daily as needed. 05/11/11  Yes Storm Frisk, MD  Lancets (ACCU-CHEK MULTICLIX) lancets 1 each by Other route 2 (two) times a week.    Yes Historical Provider, MD  Mometasone Furo-Formoterol Fum 200-5 MCG/ACT AERO Inhale 2 Inhalers into the lungs 2 (two) times daily. BRAND NAME: DULERA 09/15/10  Yes Storm Frisk, MD  omeprazole (PRILOSEC) 20 MG capsule Take 1 capsule (20 mg total) by mouth 2 (two) times daily. 09/15/10  Yes Storm Frisk, MD  potassium chloride 40 MEQ/15ML (20%) LIQD 2 tablespoons twice daily    Yes Historical Provider, MD  predniSONE (DELTASONE) 10 MG tablet Take 5 mg by mouth every morning.  05/11/11  Yes Storm Frisk, MD    simvastatin (ZOCOR) 80 MG tablet Take 80 mg by mouth at bedtime.     Yes Historical Provider, MD  warfarin (COUMADIN) 1 MG tablet Take 1 mg by mouth as directed. *Take one tablet in addition to one-half tablet of Coumadin 10mg  for a total of 6mg  in alternation with taking two tablets in addition to one-half tablet for a total of 7mg . (Patient alternates 6mg  and 7mg  daily in the evening)   Yes Historical Provider, MD  warfarin (COUMADIN) 10 MG tablet Take 5 mg by mouth as directed. Currently taking 6 mg alternation with 7mg . *Use of Coumadin 1mg  in order to receive 6mg  and 7mg  dose*   Yes Historical Provider, MD  zafirlukast (ACCOLATE) 20 MG tablet Take 1 tablet (20 mg total) by mouth 2 (two) times daily. 01/11/11 01/11/12 Yes Storm Frisk, MD   Physical Exam: Blood pressure 104/64, pulse 81, temperature 98.8 F (37.1 C), temperature source Oral, resp. rate 16, SpO2 95.00%. Filed Vitals:  08/03/11 1623 08/03/11 1747 08/03/11 2016 08/03/11 2221  BP:  111/66 98/49 104/64  Pulse:  94 81 81  Temp:  98.8 F (37.1 C) 98.6 F (37 C) 98.8 F (37.1 C)  TempSrc:  Oral Oral Oral  Resp:  20 20 16   SpO2: 93% 90% 94% 95%     General:  NAD, well appearing  Eyes: PERRL  ENT: MMM, normal  Neck: supple no adenopathy  Cardiovascular: RR, no mrg  Respiratory: Scatterred wheezes, good air movement  Abdomen: soft NT, pos bowel sounds  Skin: no rashes  Musculoskeletal: w/o deformity  Psychiatric: normal affect, mood  Neurologic: non-focal  Labs on Admission:  Basic Metabolic Panel:  Lab 08/03/11 7846  NA 137  K 3.9  CL 99  CO2 27  GLUCOSE 101*  BUN 12  CREATININE 1.05  CALCIUM 9.1  MG --  PHOS --   Liver Function Tests: No results found for this basename: AST:5,ALT:5,ALKPHOS:5,BILITOT:5,PROT:5,ALBUMIN:5 in the last 168 hours No results found for this basename: LIPASE:5,AMYLASE:5 in the last 168 hours No results found for this basename: AMMONIA:5 in the last 168  hours CBC:  Lab 08/03/11 1610  WBC 16.3*  NEUTROABS 14.6*  HGB 11.3*  HCT 34.2*  MCV 92.9  PLT 233   Cardiac Enzymes: No results found for this basename: CKTOTAL:5,CKMB:5,CKMBINDEX:5,TROPONINI:5 in the last 168 hours BNP: No components found with this basename: POCBNP:5 CBG: No results found for this basename: GLUCAP:5 in the last 168 hours  Radiological Exams on Admission: Dg Chest 2 View  08/03/2011  *RADIOLOGY REPORT*  Clinical Data: Fever and shortness of breath.  History of non- Hodgkins lymphoma.  CHEST - 2 VIEW  Comparison: Chest x-ray 04/28/2011.  Findings: Prominent interstitial markings in the lung bases bilaterally likely reflect chronic bronchial wall thickening and mild cylindrical bronchiectasis (better demonstrated on prior CT scan 06/07/2010).  No definite acute consolidative airspace disease.  No pleural effusions.  Pulmonary vasculature and the cardiomediastinal silhouette are within normal limits. Atherosclerosis of the thoracic aorta. There is a right upper extremity PICC with tip terminating in the distal superior vena cava. Orthopedic fixation hardware in the lower cervical spine.  IMPRESSION: 1.  No definite radiographic evidence of acute cardiopulmonary disease. 2.  Chronic changes in the lung bases (bronchial wall thickening and mild cylindrical bronchiectasis), similar to prior examinations. 3.  Atherosclerosis. 4.  Tip of right upper extremity PICC is in the distal superior vena cava.  Original Report Authenticated By: Florencia Reasons, M.D.    EKG: Independently reviewed.   Time spent: 27  [facility] Pager [phone]  If 7PM-7AM, please contact night-coverage [email or web address] Password [facility] 08/03/2011, 10:27 PM

## 2011-08-03 NOTE — Telephone Encounter (Signed)
Fever 102.8 Dry hacking cough, looks "terrible" per daughter. Advised per Dr. Mariel Sleet to come to ED. Orders to be placed per Dr. Mariel Sleet for IVIG.

## 2011-08-03 NOTE — ED Provider Notes (Addendum)
History  This chart was scribed for Dione Booze, MD by Erskine Emery. This patient was seen in room APA05/APA05 and the patient's care was started at 15:48.   CSN: [id number]  Arrival date & time 08/03/11  1546   First MD Initiated Contact with Patient 08/03/11 1548      Chief Complaint  Patient presents with  . Fever    (Consider location/radiation/quality/duration/timing/severity/associated sxs/prior treatment) HPI [name] is a 56 y.o. female who presents to the Emergency Department complaining of a fever around 103 degrees for the last 10 days with associated cough (productive only in the morning), chills, SOB, low BP, and mild chest tightness for the last 2 days. Pt also reports sinus congestion and drainage but claims that is baseline. Pt denies any nausea, vomiting, diarrhea, dysuria, or urinary frequency. She took Ibuprofen 10 minutes PTA but has had no Tylenol. Pt has a h/o Non Hodgkin's lymphoma for which she receives chemo therapy (last treatment was March, next is September). Pt claims she had labs done with Dr. Laurie Panda and [facility] on Tuesday and Wednesday respectively, but was not notified of any abnormal results. However, we know her WBC count was normal 1 week ago. Pt has never been a smoker.   Dr. Laurie Panda is her oncologist.    Past Medical History  Diagnosis Date  . Non Hodgkin's lymphoma   . Allergic rhinitis   . GERD (gastroesophageal reflux disease)   . Asthma   . Chronic sinusitis   . Respiratory failure   . Cavitary lung disease     Past Surgical History  Procedure Date  . Vesicovaginal fistula closure w/ tah   . Nasal sinus surgery   . Neck surgery     x 2   . Breast surgery   . Portacath placement 7/11    Removed 6/12  . Basal cell carcinoma excision 03/2011    scalp    Family History  Problem Relation Age of Onset  . Emphysema Mother   . Allergies Father   . Asthma Father     as a child  . Leukemia Maternal Grandmother   .  Diabetes Brother   . Stroke Mother     History  Substance Use Topics  . Smoking status: Never Smoker   . Smokeless tobacco: Never Used  . Alcohol Use: No    OB History    Grav Para Term Preterm Abortions TAB SAB Ect Mult Living                  Review of Systems A complete 10 system review of systems was obtained and all systems are negative except as noted in the HPI and PMH.    Allergies  Meperidine hcl and Montelukast sodium  Home Medications   Current Outpatient Rx  Name Route Sig Dispense Refill  . ACCU-CHEK AVIVA PLUS VI STRP  as directed.    . ACYCLOVIR 200 MG PO CAPS  2 capsules twice per day     . ALBUTEROL SULFATE (2.5 MG/3ML) 0.083% IN NEBU Nebulization Take 2.5 mg by nebulization every 4 (four) hours as needed. 120 mL 6  . ALBUTEROL SULFATE HFA 108 (90 BASE) MCG/ACT IN AERS Inhalation Inhale 2 puffs into the lungs every 6 (six) hours as needed. 1 Inhaler 5  . BENZONATATE 200 MG PO CAPS Oral Take 1 capsule by mouth Three times daily as needed.    [name] Kitchen CITALOPRAM HYDROBROMIDE 20 MG PO TABS Oral Take 20 mg by mouth  every morning.      [name] Kitchen EPIPEN IJ  As needed for severe allergic reaction     . FUROSEMIDE 20 MG PO TABS Oral Take 1 tablet by mouth as needed.    [name] Kitchen HYDROCOD POLST-CPM POLST ER 10-8 MG PO CP12 Oral Take 1 capsule by mouth 2 (two) times daily as needed. 20 each 0  . ACCU-CHEK MULTICLIX LANCETS MISC  as directed.    . MOMETASONE FURO-FORMOTEROL FUM 200-5 MCG/ACT IN AERO Inhalation Inhale 2 Inhalers into the lungs 2 (two) times daily. 1 Inhaler 6  . OMEPRAZOLE 20 MG PO CPDR Oral Take 1 capsule (20 mg total) by mouth 2 (two) times daily. 60 capsule 6  . POTASSIUM CHLORIDE 40 MEQ/15ML (20%) PO LIQD  2 tablespoons twice daily     . PREDNISONE 10 MG PO TABS Oral Take 5 mg by mouth daily.    [name] Kitchen SIMVASTATIN 80 MG PO TABS Oral Take 80 mg by mouth at bedtime.      . WARFARIN SODIUM 10 MG PO TABS Oral Take by mouth daily. Currently taking 6 mg alternation with 7mg     .  ZAFIRLUKAST 20 MG PO TABS Oral Take 1 tablet (20 mg total) by mouth 2 (two) times daily. 60 tablet 5    Triage Vitals: BP 139/68  Pulse 101  Temp 102.9 F (39.4 C) (Oral)  Resp 20  SpO2 91%  Physical Exam  Nursing note and vitals reviewed. Constitutional: She is oriented to person, place, and time. She appears well-developed and well-nourished. No distress.  HENT:  Head: Normocephalic and atraumatic.  Mouth/Throat: Oropharynx is clear and moist.  Eyes: EOM are normal. Pupils are equal, round, and reactive to light.  Neck: Neck supple. No tracheal deviation present.  Cardiovascular: Normal rate.   Pulmonary/Chest: She has wheezes. She has rhonchi.       Rales at both bases. Slight wheezing and rhonchi in right lung.  Musculoskeletal: Normal range of motion.  Neurological: She is alert and oriented to person, place, and time.  Skin: Skin is warm and dry.  Psychiatric: She has a normal mood and affect. Her behavior is normal.    ED Course  Procedures (including critical care time) DIAGNOSTIC STUDIES: Oxygen Saturation is 91% on room air, adequate by my interpretation.    COORDINATION OF CARE: 16:10--I evaluated the patient and we discussed a treatment plan including x-rays, a breathing treatment, blood work, and a urinalysis to which the pt agreed.   16:15--Medication orders: Albuterol (Proventil) (5 mg/mL) 0.5% nebulizer solution 2.5 mg--once   Ipratropium (Atrovent) nebulizer solution 0.5 mg--once   Acetaminophen (Tylenol) tablet 650 mg--once  19:40--I rechecked the pt. I notified her of her lab results and advised her to be admitted to the hospital.   Results for orders placed during the hospital encounter of 08/03/11  CBC WITH DIFFERENTIAL      Component Value Range   WBC 16.3 (*) 4.0 - 10.5 K/uL   RBC 3.68 (*) 3.87 - 5.11 MIL/uL   Hemoglobin 11.3 (*) 12.0 - 15.0 g/dL   HCT 45.4 (*) 09.8 - 11.9 %   MCV 92.9  78.0 - 100.0 fL   MCH 30.7  26.0 - 34.0 pg   MCHC 33.0   30.0 - 36.0 g/dL   RDW 14.7  82.9 - 56.2 %   Platelets 233  150 - 400 K/uL   Neutrophils Relative 90 (*) 43 - 77 %   Neutro Abs 14.6 (*) 1.7 - 7.7 K/uL  Lymphocytes Relative 5 (*) 12 - 46 %   Lymphs Abs 0.8  0.7 - 4.0 K/uL   Monocytes Relative 5  3 - 12 %   Monocytes Absolute 0.8  0.1 - 1.0 K/uL   Eosinophils Relative 1  0 - 5 %   Eosinophils Absolute 0.1  0.0 - 0.7 K/uL   Basophils Relative 0  0 - 1 %   Basophils Absolute 0.0  0.0 - 0.1 K/uL  BASIC METABOLIC PANEL      Component Value Range   Sodium 137  135 - 145 mEq/L   Potassium 3.9  3.5 - 5.1 mEq/L   Chloride 99  96 - 112 mEq/L   CO2 27  19 - 32 mEq/L   Glucose, Bld 101 (*) 70 - 99 mg/dL   BUN 12  6 - 23 mg/dL   Creatinine, Ser 1.61  0.50 - 1.10 mg/dL   Calcium 9.1  8.4 - 09.6 mg/dL   GFR calc non Af Amer 58 (*) >90 mL/min   GFR calc Af Amer 67 (*) >90 mL/min  URINALYSIS, ROUTINE W REFLEX MICROSCOPIC      Component Value Range   Color, Urine YELLOW  YELLOW   APPearance CLEAR  CLEAR   Specific Gravity, Urine 1.025  1.005 - 1.030   pH 6.0  5.0 - 8.0   Glucose, UA NEGATIVE  NEGATIVE mg/dL   Hgb urine dipstick SMALL (*) NEGATIVE   Bilirubin Urine NEGATIVE  NEGATIVE   Ketones, ur NEGATIVE  NEGATIVE mg/dL   Protein, ur NEGATIVE  NEGATIVE mg/dL   Urobilinogen, UA 0.2  0.0 - 1.0 mg/dL   Nitrite NEGATIVE  NEGATIVE   Leukocytes, UA NEGATIVE  NEGATIVE  URINE MICROSCOPIC-ADD ON      Component Value Range   Squamous Epithelial / LPF FEW (*) RARE   WBC, UA 7-10  <3 WBC/hpf   RBC / HPF 7-10  <3 RBC/hpf   Bacteria, UA FEW (*) RARE   Dg Chest 2 View  08/03/2011  *RADIOLOGY REPORT*  Clinical Data: Fever and shortness of breath.  History of non- Hodgkins lymphoma.  CHEST - 2 VIEW  Comparison: Chest x-ray 04/28/2011.  Findings: Prominent interstitial markings in the lung bases bilaterally likely reflect chronic bronchial wall thickening and mild cylindrical bronchiectasis (better demonstrated on prior CT scan 06/07/2010).  No  definite acute consolidative airspace disease.  No pleural effusions.  Pulmonary vasculature and the cardiomediastinal silhouette are within normal limits. Atherosclerosis of the thoracic aorta. There is a right upper extremity PICC with tip terminating in the distal superior vena cava. Orthopedic fixation hardware in the lower cervical spine.  IMPRESSION: 1.  No definite radiographic evidence of acute cardiopulmonary disease. 2.  Chronic changes in the lung bases (bronchial wall thickening and mild cylindrical bronchiectasis), similar to prior examinations. 3.  Atherosclerosis. 4.  Tip of right upper extremity PICC is in the distal superior vena cava.  Original Report Authenticated By: Florencia Reasons, M.D.    1. Fever   2. Hypogammaglobulinemia       MDM  Patient with fever, respiratory symptoms, indwelling PICC line. She has not had recent chemotherapy so she is unlikely to be leukopenic. However, on review of her prior records, she is being treated for acquired hypogammaglobulinemia. Because of immune deficiency, she will need to be admitted. Concern is present for pneumonia and also possible PICC line infection.  Chest x-ray does not show evidence of pneumonia and urinalysis does not show evidence of UTI. She  has defervesced nicely with acetaminophen, but will still need to be admitted. Blood and urine cultures have been obtained and she has been started on Rocephin. Case is discussed with Dr. Phillips Odor of [facility] who agrees to admit the patient.  I personally performed the services described in this documentation, which was scribed in my presence. The recorded information has been reviewed and considered.    Dione Booze, MD 08/03/11 2117  Patient has been seen by Dr. Phillips Odor who is discharging the patient with a prescription for ciprofloxacin and follow up with her oncologist tomorrow.  Dione Booze, MD 08/03/11 2245

## 2011-08-04 ENCOUNTER — Encounter (HOSPITAL_BASED_OUTPATIENT_CLINIC_OR_DEPARTMENT_OTHER): Payer: BC Managed Care – PPO | Admitting: Oncology

## 2011-08-04 ENCOUNTER — Other Ambulatory Visit (HOSPITAL_COMMUNITY): Payer: Self-pay | Admitting: Oncology

## 2011-08-04 ENCOUNTER — Telehealth: Payer: Self-pay | Admitting: Critical Care Medicine

## 2011-08-04 ENCOUNTER — Encounter (HOSPITAL_COMMUNITY): Payer: BC Managed Care – PPO | Attending: Oncology

## 2011-08-04 VITALS — BP 101/63 | HR 67 | Temp 98.9°F | Resp 18 | Ht 62.0 in | Wt 175.5 lb

## 2011-08-04 DIAGNOSIS — D801 Nonfamilial hypogammaglobulinemia: Secondary | ICD-10-CM

## 2011-08-04 DIAGNOSIS — J329 Chronic sinusitis, unspecified: Secondary | ICD-10-CM

## 2011-08-04 DIAGNOSIS — R509 Fever, unspecified: Secondary | ICD-10-CM | POA: Insufficient documentation

## 2011-08-04 DIAGNOSIS — J449 Chronic obstructive pulmonary disease, unspecified: Secondary | ICD-10-CM

## 2011-08-04 DIAGNOSIS — J4489 Other specified chronic obstructive pulmonary disease: Secondary | ICD-10-CM

## 2011-08-04 MED ORDER — SODIUM CHLORIDE 0.9 % IV SOLN
Freq: Once | INTRAVENOUS | Status: AC
  Administered 2011-08-04: 10:00:00 via INTRAVENOUS

## 2011-08-04 MED ORDER — CEFTAZIDIME 2 G IV SOLR: 2.0000 g | Freq: Two times a day (BID) | INTRAVENOUS | Status: DC

## 2011-08-04 MED ORDER — HEPARIN SOD (PORK) LOCK FLUSH 100 UNIT/ML IV SOLN
INTRAVENOUS | Status: AC
  Filled 2011-08-04: qty 5

## 2011-08-04 MED ORDER — IMMUNE GLOBULIN (HUMAN) 10 GM/200ML IV SOLN
10.0000 g | Freq: Once | INTRAVENOUS | Status: AC
  Administered 2011-08-04: 10 g via INTRAVENOUS
  Filled 2011-08-04: qty 200

## 2011-08-04 MED ORDER — IMMUNE GLOBULIN (HUMAN) 10 GM/200ML IV SOLN
70.0000 g | Freq: Once | INTRAVENOUS | Status: DC
  Filled 2011-08-04: qty 1400

## 2011-08-04 MED ORDER — HEPARIN SOD (PORK) LOCK FLUSH 100 UNIT/ML IV SOLN
300.0000 [IU] | Freq: Once | INTRAVENOUS | Status: AC
  Administered 2011-08-04: 300 [IU] via INTRAVENOUS
  Filled 2011-08-04: qty 5

## 2011-08-04 MED ORDER — IMMUNE GLOBULIN (HUMAN) 10 GM/200ML IV SOLN
70.0000 g | Freq: Once | INTRAVENOUS | Status: AC
  Administered 2011-08-04: 70 g via INTRAVENOUS
  Filled 2011-08-04: qty 1400

## 2011-08-04 MED ORDER — IMMUNE GLOBULIN (HUMAN) 10 GM/200ML IV SOLN
10.0000 g | Freq: Once | INTRAVENOUS | Status: DC
  Filled 2011-08-04: qty 200

## 2011-08-04 MED ORDER — IMMUNE GLOBULIN (HUMAN) 10 GM/200ML IV SOLN
1.0000 g/kg | Freq: Once | INTRAVENOUS | Status: DC
  Filled 2011-08-04: qty 1600

## 2011-08-04 NOTE — Telephone Encounter (Signed)
Please call [name] shoemaker office to get sinus culture results from 07/21/11.  Then call me so we can order home IV ABX again on this pt. She already has a PICC line

## 2011-08-04 NOTE — Progress Notes (Signed)
Tolerated well

## 2011-08-04 NOTE — Progress Notes (Signed)
Patient is seen as a work in today. She reports an emergent department at our request last night and was not admitted. She was reporting feeling terrible with fevers reaching 103F. Since the patient was was not admitted, we asked her to also call this morning. She was asked reports the clinic this morning after her telephone call. Dr. Mariel Sleet present the patient's case to Dr. Delford Field.  The patient reported shaking chills and fevers at home. She remains severely hypogammaglobulinemia. As a result, we will administer IVIG 1 g per kilogram today and tomorrow. Dr. Delford Field has agreed to manage her antibiotic.  Ammy Lienhard

## 2011-08-04 NOTE — Telephone Encounter (Signed)
I revceived the culture results and they are no anaerobic growth in 72 hours. Dr. Delford Field is aware of results and has ordered Fortaz 2 grams IV every 12 hours x 7 days to be administered at pt home by [facility]. Order placed. Rx printed and Crystal will have Dr. Delford Field sign it this evening in [place] and fax back to [facility].  Pt is aware. Carron Curie, CMA

## 2011-08-05 ENCOUNTER — Telehealth (HOSPITAL_COMMUNITY): Payer: Self-pay | Admitting: Oncology

## 2011-08-05 ENCOUNTER — Ambulatory Visit: Payer: BC Managed Care – PPO | Admitting: Critical Care Medicine

## 2011-08-05 ENCOUNTER — Encounter (HOSPITAL_BASED_OUTPATIENT_CLINIC_OR_DEPARTMENT_OTHER): Payer: BC Managed Care – PPO

## 2011-08-05 ENCOUNTER — Ambulatory Visit (HOSPITAL_COMMUNITY): Payer: BC Managed Care – PPO

## 2011-08-05 VITALS — BP 105/62 | HR 75 | Temp 97.9°F | Resp 20

## 2011-08-05 DIAGNOSIS — R509 Fever, unspecified: Secondary | ICD-10-CM

## 2011-08-05 DIAGNOSIS — D801 Nonfamilial hypogammaglobulinemia: Secondary | ICD-10-CM

## 2011-08-05 LAB — URINE CULTURE: Colony Count: >=100000

## 2011-08-05 MED ORDER — ACETAMINOPHEN 325 MG PO TABS
ORAL_TABLET | ORAL | Status: AC
  Filled 2011-08-05: qty 2

## 2011-08-05 MED ORDER — ACETAMINOPHEN 325 MG PO TABS
650.0000 mg | ORAL_TABLET | Freq: Once | ORAL | Status: AC
  Administered 2011-08-05: 650 mg via ORAL

## 2011-08-05 MED ORDER — SODIUM CHLORIDE 0.9 % IJ SOLN
INTRAMUSCULAR | Status: AC
  Filled 2011-08-05: qty 10

## 2011-08-05 MED ORDER — IMMUNE GLOBULIN (HUMAN) 10 GM/200ML IV SOLN
70.0000 g | Freq: Once | INTRAVENOUS | Status: AC
  Administered 2011-08-05: 70 g via INTRAVENOUS
  Filled 2011-08-05: qty 1400

## 2011-08-05 MED ORDER — IMMUNE GLOBULIN (HUMAN) 10 GM/200ML IV SOLN
10.0000 g | Freq: Once | INTRAVENOUS | Status: AC
  Administered 2011-08-05: 10 g via INTRAVENOUS
  Filled 2011-08-05: qty 200

## 2011-08-05 MED ORDER — IMMUNE GLOBULIN (HUMAN) 10 GM/200ML IV SOLN
1.0000 g/kg | Freq: Once | INTRAVENOUS | Status: DC
  Filled 2011-08-05: qty 1600

## 2011-08-05 MED ORDER — HEPARIN SOD (PORK) LOCK FLUSH 100 UNIT/ML IV SOLN
300.0000 [IU] | Freq: Once | INTRAVENOUS | Status: AC
  Administered 2011-08-05: 300 [IU] via INTRAVENOUS
  Filled 2011-08-05: qty 5

## 2011-08-05 MED ORDER — SODIUM CHLORIDE 0.9 % IJ SOLN
10.0000 mL | Freq: Once | INTRAMUSCULAR | Status: AC
  Administered 2011-08-05: 10 mL via INTRAVENOUS
  Filled 2011-08-05: qty 10

## 2011-08-05 MED ORDER — HEPARIN SOD (PORK) LOCK FLUSH 100 UNIT/ML IV SOLN
INTRAVENOUS | Status: AC
  Filled 2011-08-05: qty 5

## 2011-08-05 MED ORDER — SODIUM CHLORIDE 0.9 % IV SOLN
Freq: Once | INTRAVENOUS | Status: AC
  Administered 2011-08-05: 08:00:00 via INTRAVENOUS

## 2011-08-05 NOTE — Progress Notes (Signed)
[name] presented for day 2 of IVIG. PICC line located rt arm. . Good blood return present. IvIG started at titrated rate per protocol Temp 102.7 at 0940. Pt took 2 advil at 730 am. Temp 97.9 on discharge PICC line flushed with 20ml NS and 300U/20ml Heparin. Procedure without incident. Patient tolerated procedure well.

## 2011-08-05 NOTE — Telephone Encounter (Signed)
[facility] [phone] ? IF CPT J1568 IVIG OCTAGAM AND J1561 GAMUNEX REQUIRE PRIOR AUTH.   PER CARLA NO PRIOR IS REQUIRED BUT CHARGES ARE SUBJECT TO REVIEW. REF#[id number]

## 2011-08-06 IMAGING — CR DG CHEST 2V
2 series · 2 of 2 positions shown · non-contrast
Comparison: [DATE] and 09/28/2009

CLINICAL DATA: Asthmatic bronchitis.  466.0.  Cough.  Lymphoma.

CHEST - 2 VIEW

[view not recorded (1 of 2)]
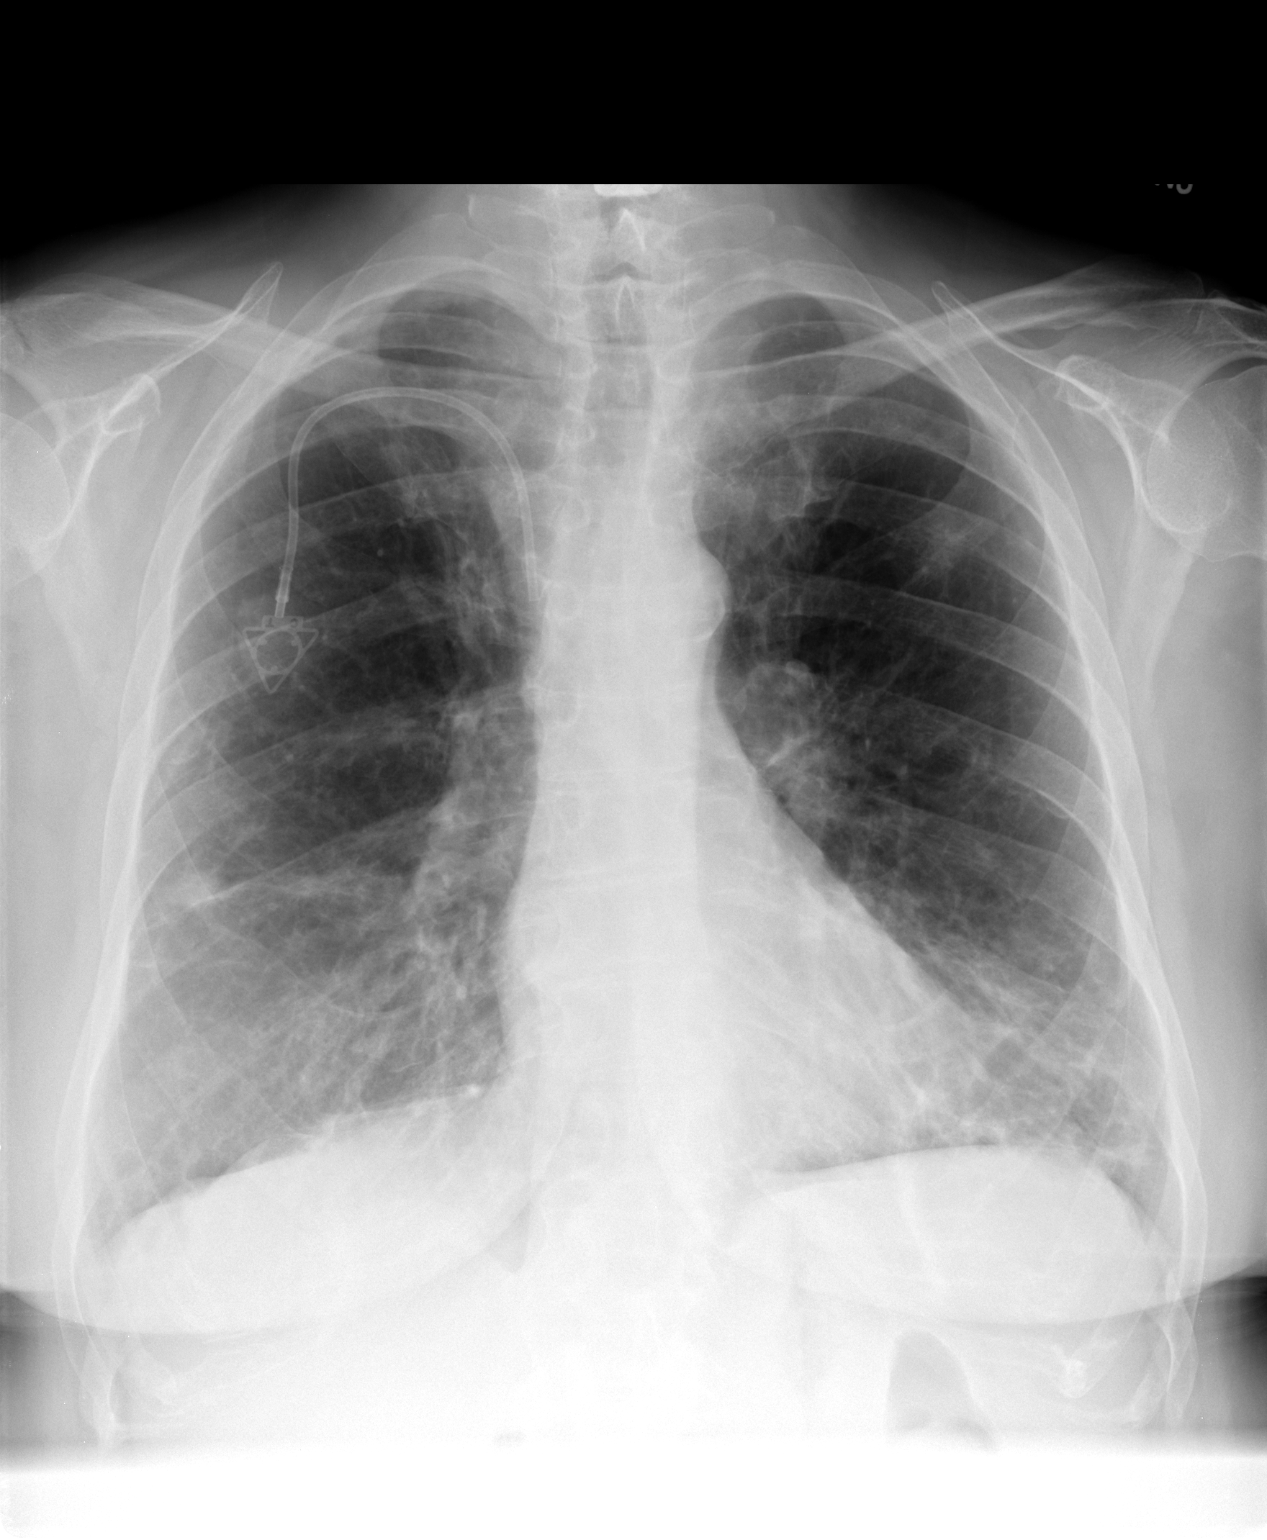

[view not recorded (2 of 2)]
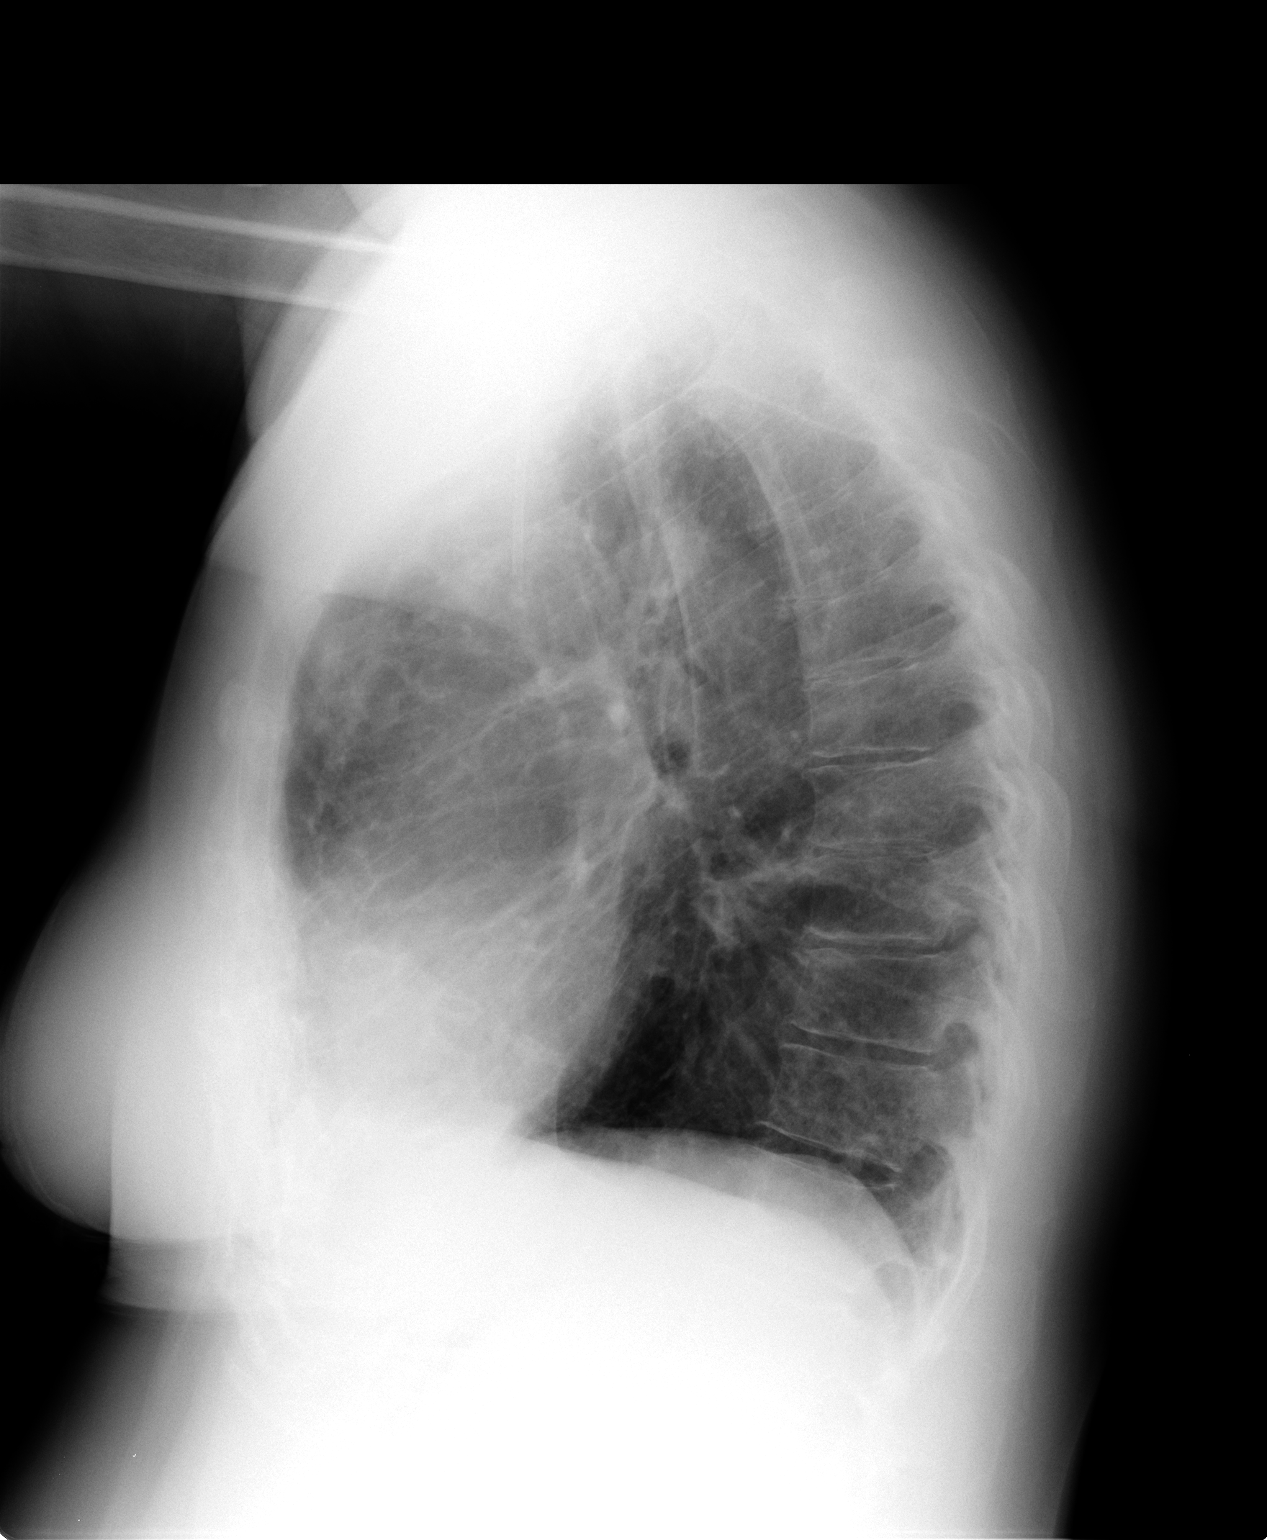

[2 of 2 positions shown; findings below may reference images not displayed]

FINDINGS: Port-A-Cath is in place.  Tracheostomy tube has been
removed.  The heart size is normal.  The main pulmonary arteries
appear slightly prominent but there is no discrete adenopathy.

There are patchy areas of residual parenchymal scarring or
infiltrates in both lungs.  There are no effusions.  Overall, there
has been improvement since the prior exam.  No discrete osseous
abnormality.
IMPRESSION: Slight improvement in the multiple patchy bilateral pulmonary
infiltrates.

Slight prominence of the hilar structures will need follow-up.

## 2011-08-08 ENCOUNTER — Telehealth: Payer: Self-pay | Admitting: Critical Care Medicine

## 2011-08-08 LAB — CULTURE, BLOOD (ROUTINE X 2)

## 2011-08-08 NOTE — Telephone Encounter (Signed)
This extension is ok with me

## 2011-08-08 NOTE — Telephone Encounter (Signed)
Called, spoke with Misty Stanley.  Informed of below per Dr. Delford Field.  She verbalized understanding of this and voiced no further questions/concerns at this time.

## 2011-08-08 NOTE — Telephone Encounter (Signed)
Called, spoke with Misty Stanley with [facility].  Requesting orders to extend home health services for picc line - requesting orders for once weekly for the next 3 weeks.  Requesting we leave detailed msg if she does not answer and will call back if any questions.  Dr. Delford Field, pls advise if this is ok.  Note:  Misty Stanley would like Dr. Delford Field to be aware pt does have a pending OV on Friday with him.  She will change any orders if needed after this OV.

## 2011-08-12 ENCOUNTER — Telehealth: Payer: Self-pay | Admitting: Critical Care Medicine

## 2011-08-12 ENCOUNTER — Ambulatory Visit (INDEPENDENT_AMBULATORY_CARE_PROVIDER_SITE_OTHER): Payer: BC Managed Care – PPO | Admitting: Critical Care Medicine

## 2011-08-12 ENCOUNTER — Encounter: Payer: Self-pay | Admitting: Critical Care Medicine

## 2011-08-12 VITALS — BP 102/68 | HR 69 | Temp 98.2°F | Ht 62.0 in | Wt 174.0 lb

## 2011-08-12 DIAGNOSIS — J019 Acute sinusitis, unspecified: Secondary | ICD-10-CM

## 2011-08-12 DIAGNOSIS — J45909 Unspecified asthma, uncomplicated: Secondary | ICD-10-CM

## 2011-08-12 DIAGNOSIS — J329 Chronic sinusitis, unspecified: Secondary | ICD-10-CM

## 2011-08-12 NOTE — Assessment & Plan Note (Signed)
Acute on chronic sinusitis and bronchitis with fever.  Positive response to IV fortaz Need to get PICC line out as has been in place since 05/2011 Plan D/c picc once has completed fortaz

## 2011-08-12 NOTE — Progress Notes (Signed)
Subjective:    Patient ID: [name], female    DOB: [birth year], 56 y.o.   MRN: [id number]  HPI  56 y.o.WF  Asthma , hypogammaglobulinemia, chronic sinusits d/t pseudomonas   PUL ASTHMA HISTORY 08/12/2011 06/17/2011 05/11/2011 03/16/2011 08/11/2010  Symptoms 0-2 days/week 0-2 days/week Throughout the day 0-2 days/week Daily  Nighttime awakenings 0-2/month 3-4/month Often--7/wk 0-2/month 3-4/month  Interference with activity Minor limitations Minor limitations Some limitations No limitations Minor limitations  SABA use 0-2 days/wk 0-2 days/wk Several times/day 0-2 days/wk > 2 days/wk--not > 1 x/day  Exacerbations requiring oral steroids 0-1 / year 0-1 / year 2 or more / year 0-1 / year 0-1 / year   06/17/2011 Feels much better, still mild foul odor.  No real chest pain.  Only sl heeze, still with cough spells not as bad.  Not as dyspneic.  Pt received total 14 days of IV Cefepime. Pt denies any significant sore throat, nasal congestion or excess secretions, fever, chills, sweats, unintended weight loss, pleurtic or exertional chest pain, orthopnea PND, or leg swelling Pt denies any increase in rescue therapy over baseline, denies waking up needing it or having any early am or nocturnal exacerbations of coughing/wheezing/or dyspnea. Pt also denies any obvious fluctuation in symptoms with  weather or environmental change or other alleviating or aggravating factors Pt is still on coumadin.  Pt notes weight up 10# in one week >>Fortaz x 7 days via PICC   07/05/2011 Follow up  2 week follow up sinusitis - reports breathing and coughing are unchanged since last visit with SOB, coughing, chest tightness.  had MRI head for recurrent headache. Showed persistent sinusitis on right. Over last 2 months she had received 3  courses of IV abx via PICC (2 -7d and 1 -14 d regimens)  She does feel better w/ clear mucus but not resolved with persistent sinus tenderness, pressure and teeth pain . Mucus is clear.  Cough on/off.  No wheezing  Has chronic sinusitis d/t psuedomonas. Had sinus surgery in past x 2 . Followed by ENT.  Still has PICC line. No redness or fever.  No chest pain or hemoptysis.   08/12/2011 Doing better.  No real cough .  Picc line has been in place since 5/13.   No mucus.  Dyspnea is better.  No real wheeze.  Occ cough paroxysms. See asthma control assessment above. Pt denies any significant sore throat, nasal congestion or excess secretions, fever, chills, sweats, unintended weight loss, pleurtic or exertional chest pain, orthopnea PND, or leg swelling Pt denies any increase in rescue therapy over baseline, denies waking up needing it or having any early am or nocturnal exacerbations of coughing/wheezing/or dyspnea. Pt also denies any obvious fluctuation in symptoms with  weather or environmental change or other alleviating or aggravating factors       Past Medical History  Diagnosis Date  . Non Hodgkin's lymphoma   . Allergic rhinitis   . GERD (gastroesophageal reflux disease)   . Asthma   . Chronic sinusitis   . Respiratory failure   . Cavitary lung disease      Family History  Problem Relation Age of Onset  . Emphysema Mother   . Allergies Father   . Asthma Father     as a child  . Leukemia Maternal Grandmother   . Diabetes Brother   . Stroke Mother      History   Social History  . Marital Status: Divorced    Spouse Name: N/A  Number of Children: 3  . Years of Education: N/A   Occupational History  . [employer] Service Manager    Social History Main Topics  . Smoking status: Never Smoker   . Smokeless tobacco: Never Used  . Alcohol Use: No  . Drug Use: Not on file  . Sexually Active: Not on file   Other Topics Concern  . Not on file   Social History Narrative  . No narrative on file     Allergies  Allergen Reactions  . Meperidine Hcl Anaphylaxis  . Montelukast Sodium Hives and Rash     Outpatient Prescriptions Prior to Visit    Medication Sig Dispense Refill  . ACCU-CHEK AVIVA PLUS test strip 1 each by Other route 2 (two) times a week.       [name] Kitchen acetaminophen (TYLENOL) 500 MG tablet Take 1 tablet (500 mg total) by mouth every 6 (six) hours as needed for pain or fever.  30 tablet  0  . acyclovir (ZOVIRAX) 200 MG capsule 2 capsules twice per day       . albuterol (PROVENTIL) (2.5 MG/3ML) 0.083% nebulizer solution Take 2.5 mg by nebulization every 4 (four) hours as needed.  120 mL  6  . albuterol (VENTOLIN HFA) 108 (90 BASE) MCG/ACT inhaler Inhale 2 puffs into the lungs every 6 (six) hours as needed.  1 Inhaler  5  . benzonatate (TESSALON) 200 MG capsule Take 1 capsule by mouth Three times daily as needed.      . Ceftazidime (FORTAZ) 2 G SOLR Inject 2 g into the vein every 12 (twelve) hours.  14 each  0  . citalopram (CELEXA) 20 MG tablet Take 20 mg by mouth every morning.        [name] Kitchen EPINEPHrine (EPIPEN IJ) As needed for severe allergic reaction       . furosemide (LASIX) 20 MG tablet Take 1 tablet by mouth every other day. *Usually takes as needed for fluid retention upon weighing in**      . Hydrocod Polst-Chlorphen Polst (TUSSICAPS) 10-8 MG CP12 Take 1 capsule by mouth 2 (two) times daily as needed.  20 each  0  . Lancets (ACCU-CHEK MULTICLIX) lancets 1 each by Other route 2 (two) times a week.       . Mometasone Furo-Formoterol Fum 200-5 MCG/ACT AERO Inhale 2 Inhalers into the lungs 2 (two) times daily. BRAND NAME: DULERA      . omeprazole (PRILOSEC) 20 MG capsule Take 1 capsule (20 mg total) by mouth 2 (two) times daily.  60 capsule  6  . potassium chloride 40 MEQ/15ML (20%) LIQD 2 tablespoons twice daily       . predniSONE (DELTASONE) 10 MG tablet Take 5 mg by mouth every morning.       . simvastatin (ZOCOR) 80 MG tablet Take 80 mg by mouth at bedtime.        [name] Kitchen warfarin (COUMADIN) 1 MG tablet Take 1 mg by mouth as directed. *Take one tablet in addition to one-half tablet of Coumadin 10mg  for a total of 6mg  in alternation  with taking two tablets in addition to one-half tablet for a total of 7mg . (Patient alternates 6mg  and 7mg  daily in the evening)      . warfarin (COUMADIN) 10 MG tablet Take 5 mg by mouth as directed. Currently taking 6 mg alternation with 7mg . *Use of Coumadin 1mg  in order to receive 6mg  and 7mg  dose*      . zafirlukast (ACCOLATE) 20 MG tablet Take  1 tablet (20 mg total) by mouth 2 (two) times daily.  60 tablet  5   Facility-Administered Medications Prior to Visit  Medication Dose Route Frequency Provider Last Rate Last Dose  . 0.9 %  sodium chloride infusion   Intravenous Continuous Randall An, MD 20 mL/hr at 05/12/11 0914    . Immune Globulin 5% (OCTAGAM) SOLN 10 g  10 g Intravenous Once Ellouise Newer, [place]      . Immune Globulin 5% (OCTAGAM) SOLN 70 g  70 g Intravenous Once Ellouise Newer, [place]         Review of Systems  Constitutional:   No  weight loss, night sweats,  Fevers, chills, fatigue, lassitude. HEENT:   Notes  headaches,  Difficulty swallowing,  Tooth/dental problems,  Sore throat,                No sneezing, itching, ear ache, nasal congestion, +++ post nasal drip,   CV:  No chest pain,  Orthopnea, PND, swelling in lower extremities, anasarca, dizziness, palpitations  GI  No heartburn, indigestion, abdominal pain, nausea, vomiting, diarrhea, change in bowel habits, loss of appetite  Resp: Notes  shortness of breath with exertion and  at rest.  Notes  excess mucus, notes  productive cough,  No non-productive cough,  No coughing up of blood.  Notes  change in color of mucus.  No wheezing.  No chest wall deformity  Skin: no rash or lesions.  GU: no dysuria, change in color of urine, no urgency or frequency.  No flank pain.  MS:  No joint pain or swelling.  No decreased range of motion.  No back pain.  Psych:  No change in mood or affect. No depression or anxiety.  No memory loss.     Objective:   Physical Exam  Filed Vitals:   08/12/11 1144  BP: 102/68    Pulse: 69  Temp: 98.2 F (36.8 C)  TempSrc: Oral  Height: 5\' 2"  (1.575 m)  Weight: 78.926 kg (174 lb)  SpO2: 95%    Gen: Pleasant, well-nourished, in no distress,  normal affect  ENT: No lesions,  mouth clear,  oropharynx clear,  Mild nasal redness , R nasal clear mucus   Neck: No JVD, no TMG, no carotid bruits  Lungs: No use of accessory muscles, no dullness to percussion, coarse BS w/ no wheezing   Cardiovascular: RRR, heart sounds normal, no murmur or gallops, no peripheral edema  Abdomen: soft and NT, no HSM,  BS normal  Musculoskeletal: No deformities, no cyanosis or clubbing  Neuro: alert, non focal  Skin: Warm, no lesions or rashes      Assessment & Plan:   SINUSITIS, CHRONIC NOS Acute on chronic sinusitis and bronchitis with fever.  Positive response to IV fortaz Need to get PICC line out as has been in place since 05/2011 Plan D/c picc once has completed fortaz   Extrinsic asthma, unspecified Severe persistent asthma improved with sinusitis Rx Not sinus c/s from 7/13: neg from ENT Plan No change in inhaled or maintenance medications. Return in  6 weeks     Updated Medication List Outpatient Encounter Prescriptions as of 08/12/2011  Medication Sig Dispense Refill  . ACCU-CHEK AVIVA PLUS test strip 1 each by Other route 2 (two) times a week.       [name] Kitchen acetaminophen (TYLENOL) 500 MG tablet Take 1 tablet (500 mg total) by mouth every 6 (six) hours as needed for pain or fever.  30  tablet  0  . acyclovir (ZOVIRAX) 200 MG capsule 2 capsules twice per day       . albuterol (PROVENTIL) (2.5 MG/3ML) 0.083% nebulizer solution Take 2.5 mg by nebulization every 4 (four) hours as needed.  120 mL  6  . albuterol (VENTOLIN HFA) 108 (90 BASE) MCG/ACT inhaler Inhale 2 puffs into the lungs every 6 (six) hours as needed.  1 Inhaler  5  . benzonatate (TESSALON) 200 MG capsule Take 1 capsule by mouth Three times daily as needed.      . Ceftazidime (FORTAZ) 2 G SOLR Inject 2 g  into the vein every 12 (twelve) hours.  14 each  0  . citalopram (CELEXA) 20 MG tablet Take 20 mg by mouth every morning.        [name] Kitchen EPINEPHrine (EPIPEN IJ) As needed for severe allergic reaction       . furosemide (LASIX) 20 MG tablet Take 1 tablet by mouth every other day. *Usually takes as needed for fluid retention upon weighing in**      . Hydrocod Polst-Chlorphen Polst (TUSSICAPS) 10-8 MG CP12 Take 1 capsule by mouth 2 (two) times daily as needed.  20 each  0  . Lancets (ACCU-CHEK MULTICLIX) lancets 1 each by Other route 2 (two) times a week.       . Mometasone Furo-Formoterol Fum 200-5 MCG/ACT AERO Inhale 2 Inhalers into the lungs 2 (two) times daily. BRAND NAME: DULERA      . omeprazole (PRILOSEC) 20 MG capsule Take 1 capsule (20 mg total) by mouth 2 (two) times daily.  60 capsule  6  . potassium chloride 40 MEQ/15ML (20%) LIQD 2 tablespoons twice daily       . predniSONE (DELTASONE) 10 MG tablet Take 5 mg by mouth every morning.       . simvastatin (ZOCOR) 80 MG tablet Take 80 mg by mouth at bedtime.        [name] Kitchen warfarin (COUMADIN) 1 MG tablet Take 1 mg by mouth as directed. *Take one tablet in addition to one-half tablet of Coumadin 10mg  for a total of 6mg  in alternation with taking two tablets in addition to one-half tablet for a total of 7mg . (Patient alternates 6mg  and 7mg  daily in the evening)      . warfarin (COUMADIN) 10 MG tablet Take 5 mg by mouth as directed. Currently taking 6 mg alternation with 7mg . *Use of Coumadin 1mg  in order to receive 6mg  and 7mg  dose*      . zafirlukast (ACCOLATE) 20 MG tablet Take 1 tablet (20 mg total) by mouth 2 (two) times daily.  60 tablet  5   Facility-Administered Encounter Medications as of 08/12/2011  Medication Dose Route Frequency Provider Last Rate Last Dose  . 0.9 %  sodium chloride infusion   Intravenous Continuous Randall An, MD 20 mL/hr at 05/12/11 0914    . Immune Globulin 5% (OCTAGAM) SOLN 10 g  10 g Intravenous Once Ellouise Newer, [place]       . Immune Globulin 5% (OCTAGAM) SOLN 70 g  70 g Intravenous Once Ellouise Newer, [place]

## 2011-08-12 NOTE — Assessment & Plan Note (Signed)
Severe persistent asthma improved with sinusitis Rx Not sinus c/s from 7/13: neg from ENT Plan No change in inhaled or maintenance medications. Return in  6 weeks

## 2011-08-12 NOTE — Telephone Encounter (Signed)
Called and spoke with lynn and they wanted to verify that PW wanted the PICC line pulled after the last dose of Fortaz.  i read back to [place] what was printed in the pt dc instructions today.  Nothing further needed.

## 2011-08-12 NOTE — Patient Instructions (Addendum)
The PICC line will be pulled after last dose of Fortaz given No other medication changes Return 6 weeks

## 2011-08-15 ENCOUNTER — Telehealth: Payer: Self-pay | Admitting: Critical Care Medicine

## 2011-08-15 NOTE — Telephone Encounter (Signed)
Nurse called back. Please call her cell # [phone] asap. Nurse had to leave pt's home and will have to return when she gets a call from our nurse re: dr's recs / orders. [name]

## 2011-08-15 NOTE — Telephone Encounter (Signed)
I reviewed case with Misty Stanley. Asked her to pull the PICC. If the patient develops dyspnea, cough then she will need to be evaluated for a new process. I believe risk/benefit lies on side of removing the PICC under these circumstances.

## 2011-08-15 NOTE — Telephone Encounter (Signed)
Called spoke with Misty Stanley, who reports pt has ronchi in both lungs, worse on left; rattling, dry cough.  No fever, SOB or wheezes noted.  Has taken mucinex in the past, but none now.  Misty Stanley is there to d/c the PICC line and would like to know if this still needs to be d/c'd, or post-poned?  PW not in office today, will forward to the doc on call.  Dr Craige Cotta please advise, thanks.  *Misty Stanley is requesting this be done ASAP as she is at pt's home now.

## 2011-08-15 NOTE — Telephone Encounter (Signed)
Misty Stanley calling back.  She will have to go back to pt's house.  Will forward to both VS and RB.

## 2011-08-22 ENCOUNTER — Telehealth (HOSPITAL_COMMUNITY): Payer: Self-pay

## 2011-08-22 NOTE — Telephone Encounter (Signed)
Message copied by Evelena Leyden on Mon Aug 22, 2011  5:44 PM ------      Message from: Mariel Sleet, ERIC S      Created: Mon Aug 22, 2011  5:35 PM       Call her and see if picc was removed or is scheduled to be removed after IV Elita Quick?

## 2011-08-22 NOTE — Telephone Encounter (Signed)
PICC was removed last Monday.

## 2011-08-23 IMAGING — CT CT PARANASAL SINUSES LIMITED
2 of 3 series · 15 of 30 positions shown, 18 images · non-contrast
Comparison: 11/06/2009 CT head.

CLINICAL DATA: Chronic sinusitis.  History of non-Hodgkins
lymphoma.  Ongoing chemotherapy.

CT PARANASAL SINUS LIMITED WITHOUT CONTRAST
TECHNIQUE: Multidetector CT images of the paranasal sinuses were
obtained in a single plane without contrast.

[Series 3: ltd sinuses 3.0 h40s st · axial · 0.27mm/px · z∈[-208,-124]mm · 5 of 42 slices shown]
[im 7/42  bone]
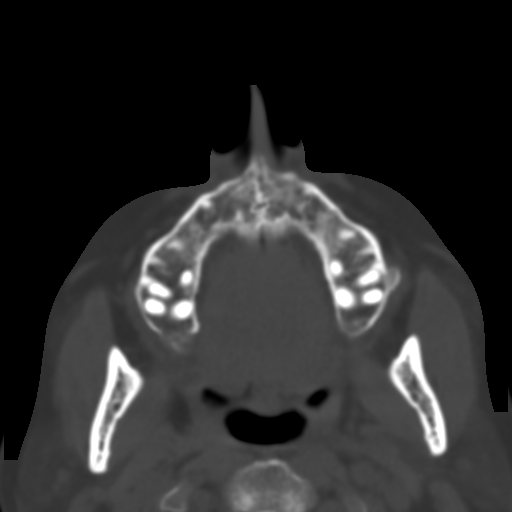
[im 14/42  bone]
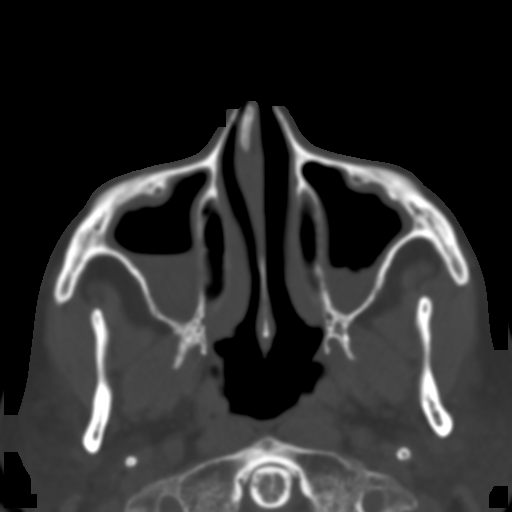
[im 21/42  bone]
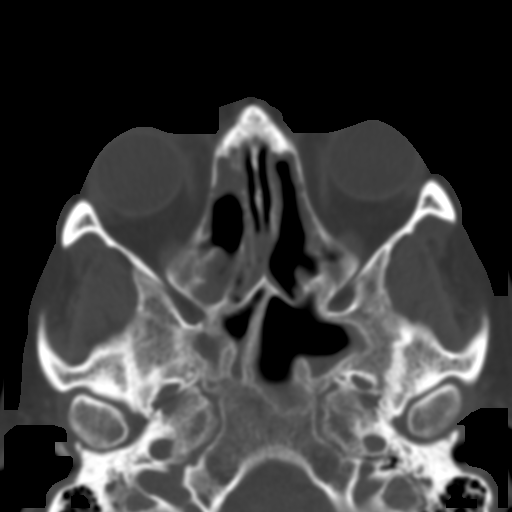
[im 28/42  bone]
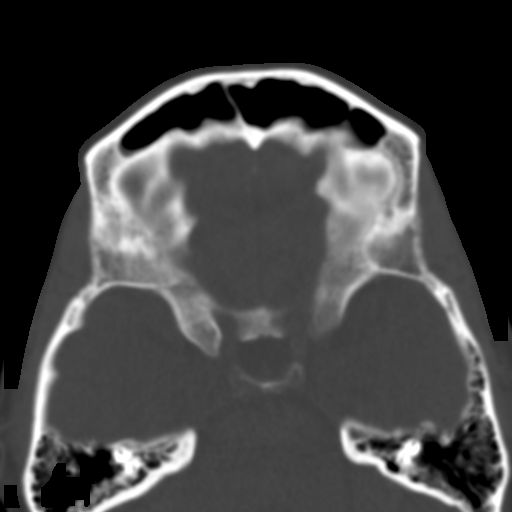
[im 35/42  bone]
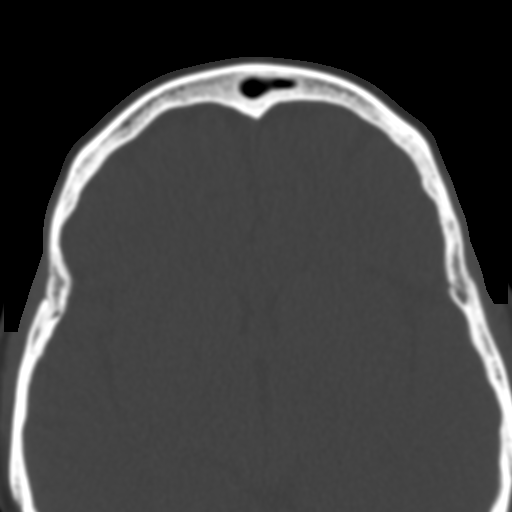

[Series 6: rtn chest without st · axial · non-contrast · 0.71mm/px · z∈[-550,-310]mm · 10 of 61 slices shown, 13 images]
[im 7/61  mediastinal]
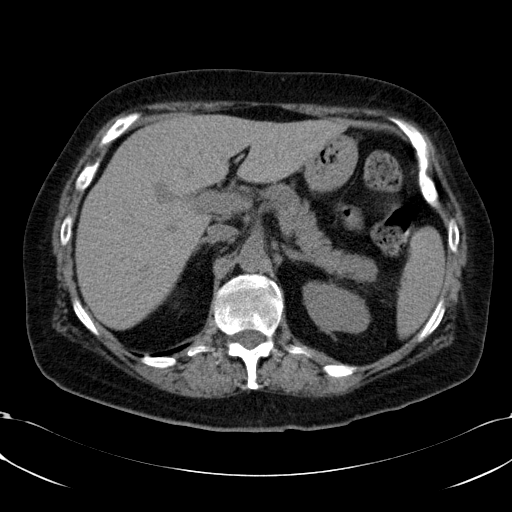
[im 7/61  bone]
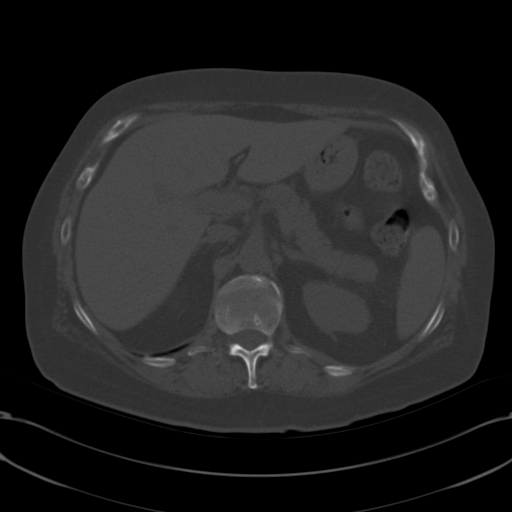
[im 13/61  bone]
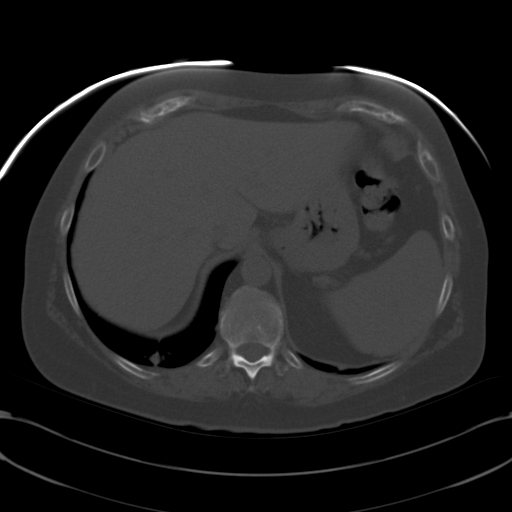
[im 19/61  bone]
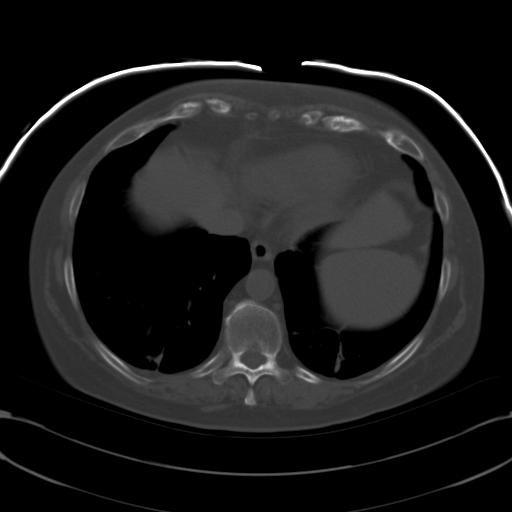
[im 25/61  bone]
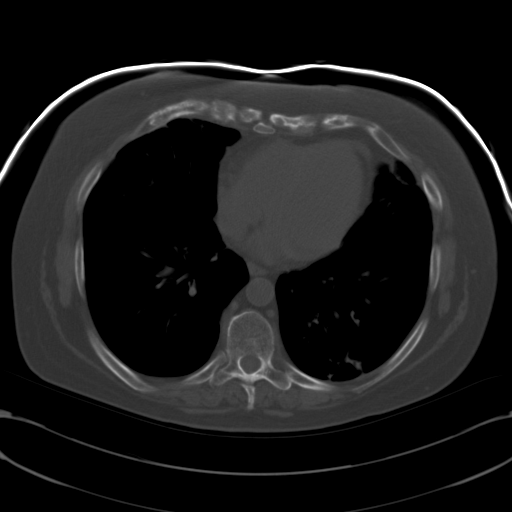
[im 31/61  mediastinal]
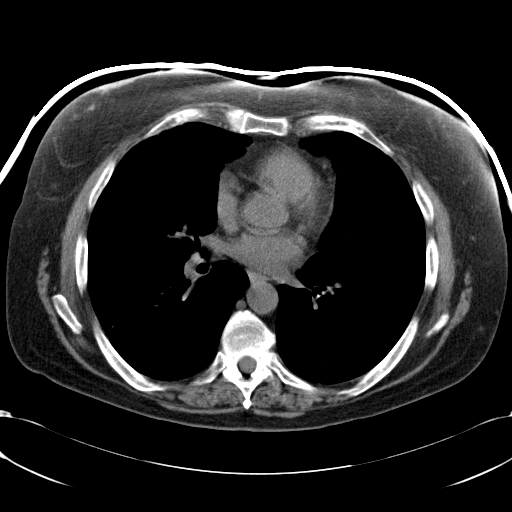
[im 31/61  bone]
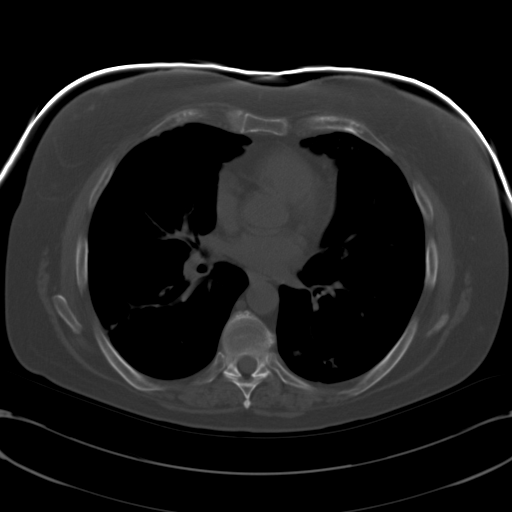
[im 37/61  bone]
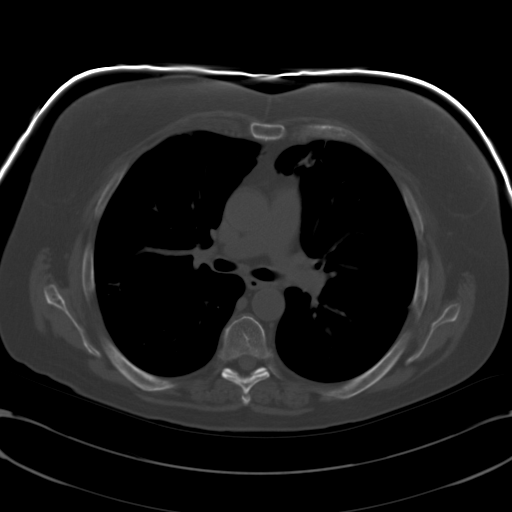
[im 43/61  bone]
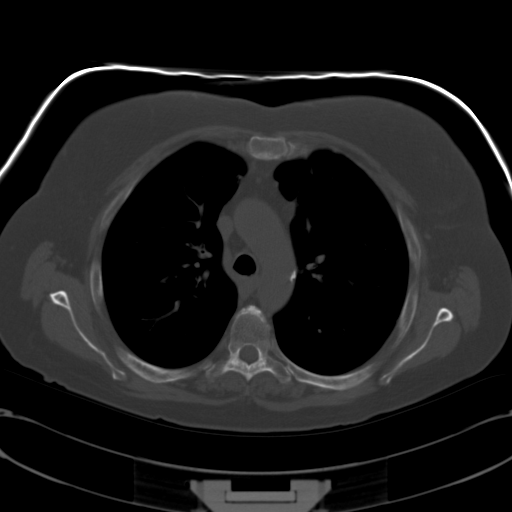
[im 47/61  bone]
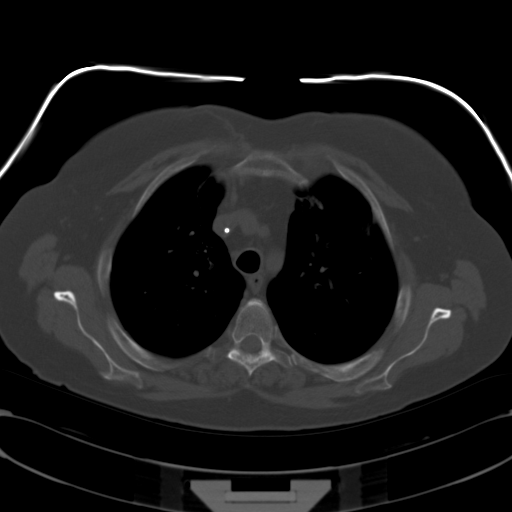
[im 49/61  mediastinal]
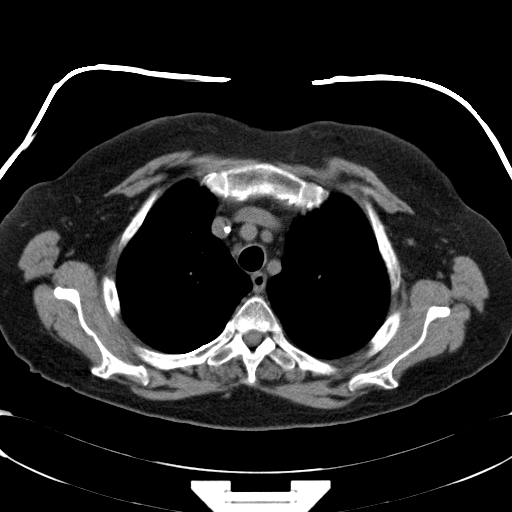
[im 49/61  bone]
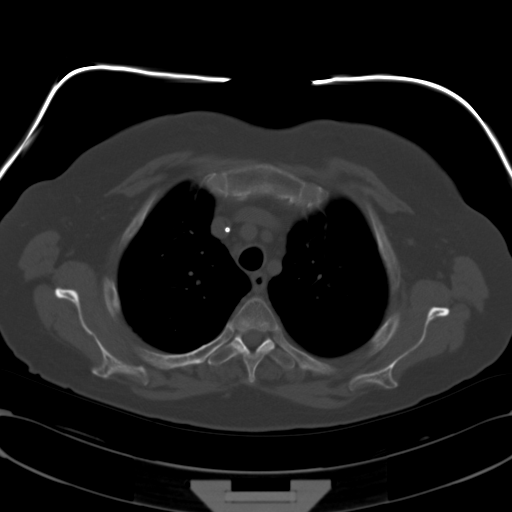
[im 55/61  bone]
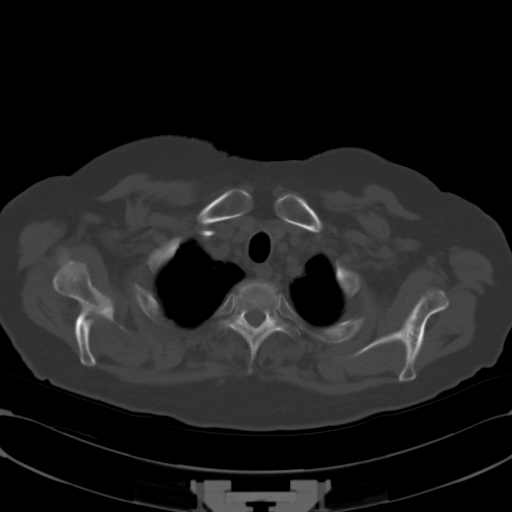

[15 of 30 positions shown; findings below may reference images not displayed]

FINDINGS: Prior sinus surgery involving medial wall of the
maxillary sinuses and the nasal turbinates bilaterally.  Persistent
fluid level with mucosal thickening of the maxillary sinuses
bilaterally suggesting acute and chronic component of sinusitis.

Persistent opacification/mucosal thickening of the ethmoid sinus
air cells bilaterally.  There has  been interval slight decrease in
degree of opacification involving posterior right ethmoid sinus air
cell.

Right frontal sinus mucosal thickening/partial opacification.
Interval clearing of the left frontal sinus mucosal thickening.

Asymmetric appearance of the sphenoid sinuses with the septum
deviated towards the right internal carotid artery with a larger
left sphenoid sinus air cell.  Opacification of the sphenoid sinus
air cells bilaterally with air-fluid level on the left suggesting
acute sinusitis.  The degree of opacification of the sphenoid
sinuses has improved since prior exam.

Visualized mastoid air cells middle ear cavities are clear.

No evidence of extension of sinus disease into the region of the
orbits.  Evaluation for possible intracranial extension is limited.
Visualized intracranial structures without gross abnormality.
IMPRESSION: Portions of the paranasal sinuses have demonstrated slight decrease
in degree of opacification.  However, significant sinus disease
remains including air fluid levels in the maxillary sinuses
bilaterally and left sphenoid sinus suggesting an acute component
of sinusitis in addition to underlying chronic changes.  Please see
above.

## 2011-09-06 ENCOUNTER — Encounter (HOSPITAL_COMMUNITY): Payer: Self-pay | Admitting: Oncology

## 2011-09-06 ENCOUNTER — Encounter (HOSPITAL_COMMUNITY): Payer: BC Managed Care – PPO | Attending: Oncology | Admitting: Oncology

## 2011-09-06 VITALS — BP 107/66 | HR 66 | Temp 97.8°F | Resp 20 | Wt 175.6 lb

## 2011-09-06 DIAGNOSIS — R413 Other amnesia: Secondary | ICD-10-CM | POA: Insufficient documentation

## 2011-09-06 DIAGNOSIS — C8589 Other specified types of non-Hodgkin lymphoma, extranodal and solid organ sites: Secondary | ICD-10-CM | POA: Insufficient documentation

## 2011-09-06 DIAGNOSIS — D801 Nonfamilial hypogammaglobulinemia: Secondary | ICD-10-CM

## 2011-09-06 DIAGNOSIS — C8596 Non-Hodgkin lymphoma, unspecified, intrapelvic lymph nodes: Secondary | ICD-10-CM

## 2011-09-06 DIAGNOSIS — J45909 Unspecified asthma, uncomplicated: Secondary | ICD-10-CM

## 2011-09-06 LAB — CBC WITH DIFFERENTIAL/PLATELET
Basophils Absolute: 0 10*3/uL (ref 0.0–0.1)
Basophils Relative: 1 % (ref 0–1)
HCT: 37.3 % (ref 36.0–46.0)
Lymphocytes Relative: 12 % (ref 12–46)
Monocytes Absolute: 0.5 10*3/uL (ref 0.1–1.0)
Neutro Abs: 4.8 10*3/uL (ref 1.7–7.7)
Neutrophils Relative %: 76 % (ref 43–77)
Platelets: 153 10*3/uL (ref 150–400)
RDW: 14.3 % (ref 11.5–15.5)
WBC: 6.3 10*3/uL (ref 4.0–10.5)

## 2011-09-06 LAB — HOMOCYSTEINE: Homocysteine: 13.5 umol/L (ref 4.0–15.4)

## 2011-09-06 LAB — LACTATE DEHYDROGENASE: LDH: 192 U/L (ref 94–250)

## 2011-09-06 LAB — FOLATE: Folate: 18.8 ng/mL

## 2011-09-06 MED ORDER — ACYCLOVIR 400 MG PO TABS: ORAL_TABLET | ORAL | Status: DC

## 2011-09-06 MED ORDER — ACYCLOVIR 200 MG PO CAPS: ORAL_CAPSULE | ORAL | Status: DC

## 2011-09-06 NOTE — Patient Instructions (Addendum)
[name]  DOB 01-22-1955 CSN [id number]  MRN [id number] Dr. Glenford Peers [facility]  Discharge Instructions  RECOMMENDATIONS MADE BY THE CONSULTANT AND ANY TEST RESULTS WILL BE SENT TO YOUR REFERRING DOCTOR.   EXAM FINDINGS BY MD TODAY AND SIGNS AND SYMPTOMS TO REPORT TO CLINIC OR PRIMARY MD: you look good today.  MEDICATIONS PRESCRIBED: none   INSTRUCTIONS GIVEN AND DISCUSSED: Other :  Report frequent infections, night sweats, new lumps, etc.  SPECIAL INSTRUCTIONS/FOLLOW-UP: Lab work Needed today, Xray Studies Needed:  PET in January and Return to Clinic after scans in January.   I acknowledge that I have been informed and understand all the instructions given to me and received a copy. I do not have any more questions at this time, but understand that I may call the Specialty Clinic at [facility] at [phone] during business hours should I have any further questions or need assistance in obtaining follow-up care.    __________________________________________  _____________  __________ Signature of Patient or Authorized Representative            Date                   Time    __________________________________________ Nurse's Signature

## 2011-09-06 NOTE — Progress Notes (Signed)
Problem number 1 diffuse large B-cell lymphoma stage IVb treated with R. CHOP x4 cycles along with intrathecal chemotherapy during cycles 3 and 4 with methotrexate and Solu-Cortef. Her chemotherapy was stopped due to severe Pseudomonas sepsis after cycles 2 and 4 the latter was life threatening. Problem #2 chronic Pseudomonas sinusitis and that appears to be quiescent for the moment Problem #3 severe hypogammaglobulinemia secondary to #1 Problem #4 SVC syndrome complicated by Port-A-Cath placement though the port has been removed Problem #5 adult-onset asthma x6 years now.  Her PICC line is now and for the moment she is stable. She does not have a fever. She's not coughing up yellow or green phlegm nor does she have the foul smell nor does she have the green discharge from her nose when she bends over or blows her nose. foul smell nor does she. Her lungs are clear except for a few rhonchi at both bases. There are no rubs. She has no adenopathy in the cervical, supraclavicular, infraclavicular, axillary or inguinal areas. She does not have hepatosplenomegaly.  I will do a PET scan after the first of the year. She will continue see Dr. Delford Field

## 2011-09-09 ENCOUNTER — Encounter (HOSPITAL_BASED_OUTPATIENT_CLINIC_OR_DEPARTMENT_OTHER): Payer: BC Managed Care – PPO

## 2011-09-09 VITALS — BP 127/73 | HR 68 | Temp 98.1°F | Resp 18 | Wt 177.4 lb

## 2011-09-09 DIAGNOSIS — Z5112 Encounter for antineoplastic immunotherapy: Secondary | ICD-10-CM

## 2011-09-09 DIAGNOSIS — C8589 Other specified types of non-Hodgkin lymphoma, extranodal and solid organ sites: Secondary | ICD-10-CM

## 2011-09-09 LAB — METHYLMALONIC ACID, SERUM: Methylmalonic Acid, Quantitative: 0.53 umol/L — ABNORMAL HIGH (ref <0.40)

## 2011-09-09 MED ORDER — SODIUM CHLORIDE 0.9 % IJ SOLN
10.0000 mL | INTRAMUSCULAR | Status: DC | PRN
  Administered 2011-09-09: 10 mL
  Filled 2011-09-09: qty 10

## 2011-09-09 MED ORDER — SODIUM CHLORIDE 0.9 % IV SOLN
Freq: Once | INTRAVENOUS | Status: AC
  Administered 2011-09-09: 09:00:00 via INTRAVENOUS

## 2011-09-09 MED ORDER — SODIUM CHLORIDE 0.9 % IV SOLN
375.0000 mg/m2 | Freq: Once | INTRAVENOUS | Status: AC
  Administered 2011-09-09: 700 mg via INTRAVENOUS
  Filled 2011-09-09: qty 70

## 2011-09-09 MED ORDER — ACETAMINOPHEN 325 MG PO TABS: 650.0000 mg | ORAL_TABLET | Freq: Once | ORAL | Status: DC

## 2011-09-09 MED ORDER — HEPARIN SOD (PORK) LOCK FLUSH 100 UNIT/ML IV SOLN
500.0000 [IU] | Freq: Once | INTRAVENOUS | Status: DC | PRN
  Filled 2011-09-09: qty 5

## 2011-09-09 MED ORDER — DIPHENHYDRAMINE HCL 25 MG PO CAPS: 50.0000 mg | ORAL_CAPSULE | Freq: Once | ORAL | Status: DC

## 2011-09-09 NOTE — Progress Notes (Signed)
Tolerated rituxan well 

## 2011-09-15 IMAGING — CR DG CHEST 2V
2 series · 2 of 2 positions shown · non-contrast
Comparison: 12/14/2009

CLINICAL DATA: Cough, fever, syncope.  Weakness.  The patient fell
at doctor's office.  Difficulty elevating arms.

CHEST - 2 VIEW

[view not recorded (1 of 2)]
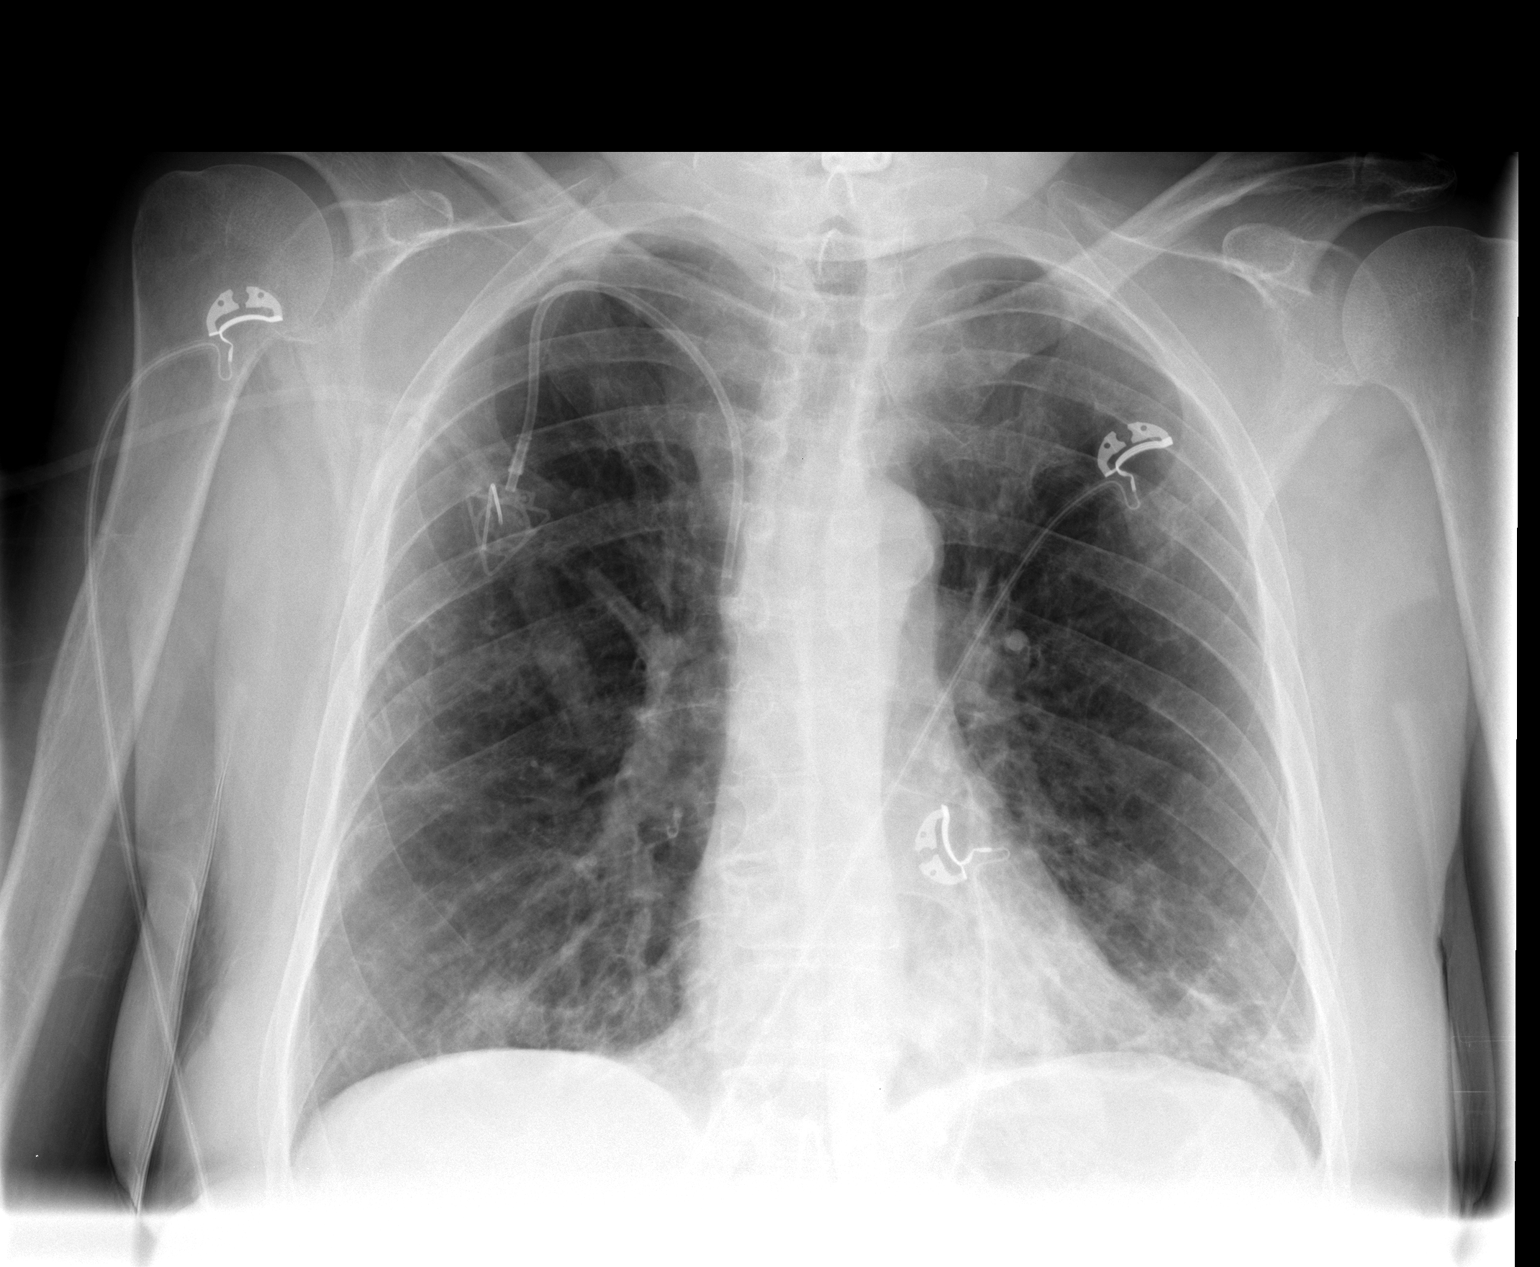

[view not recorded (2 of 2)]
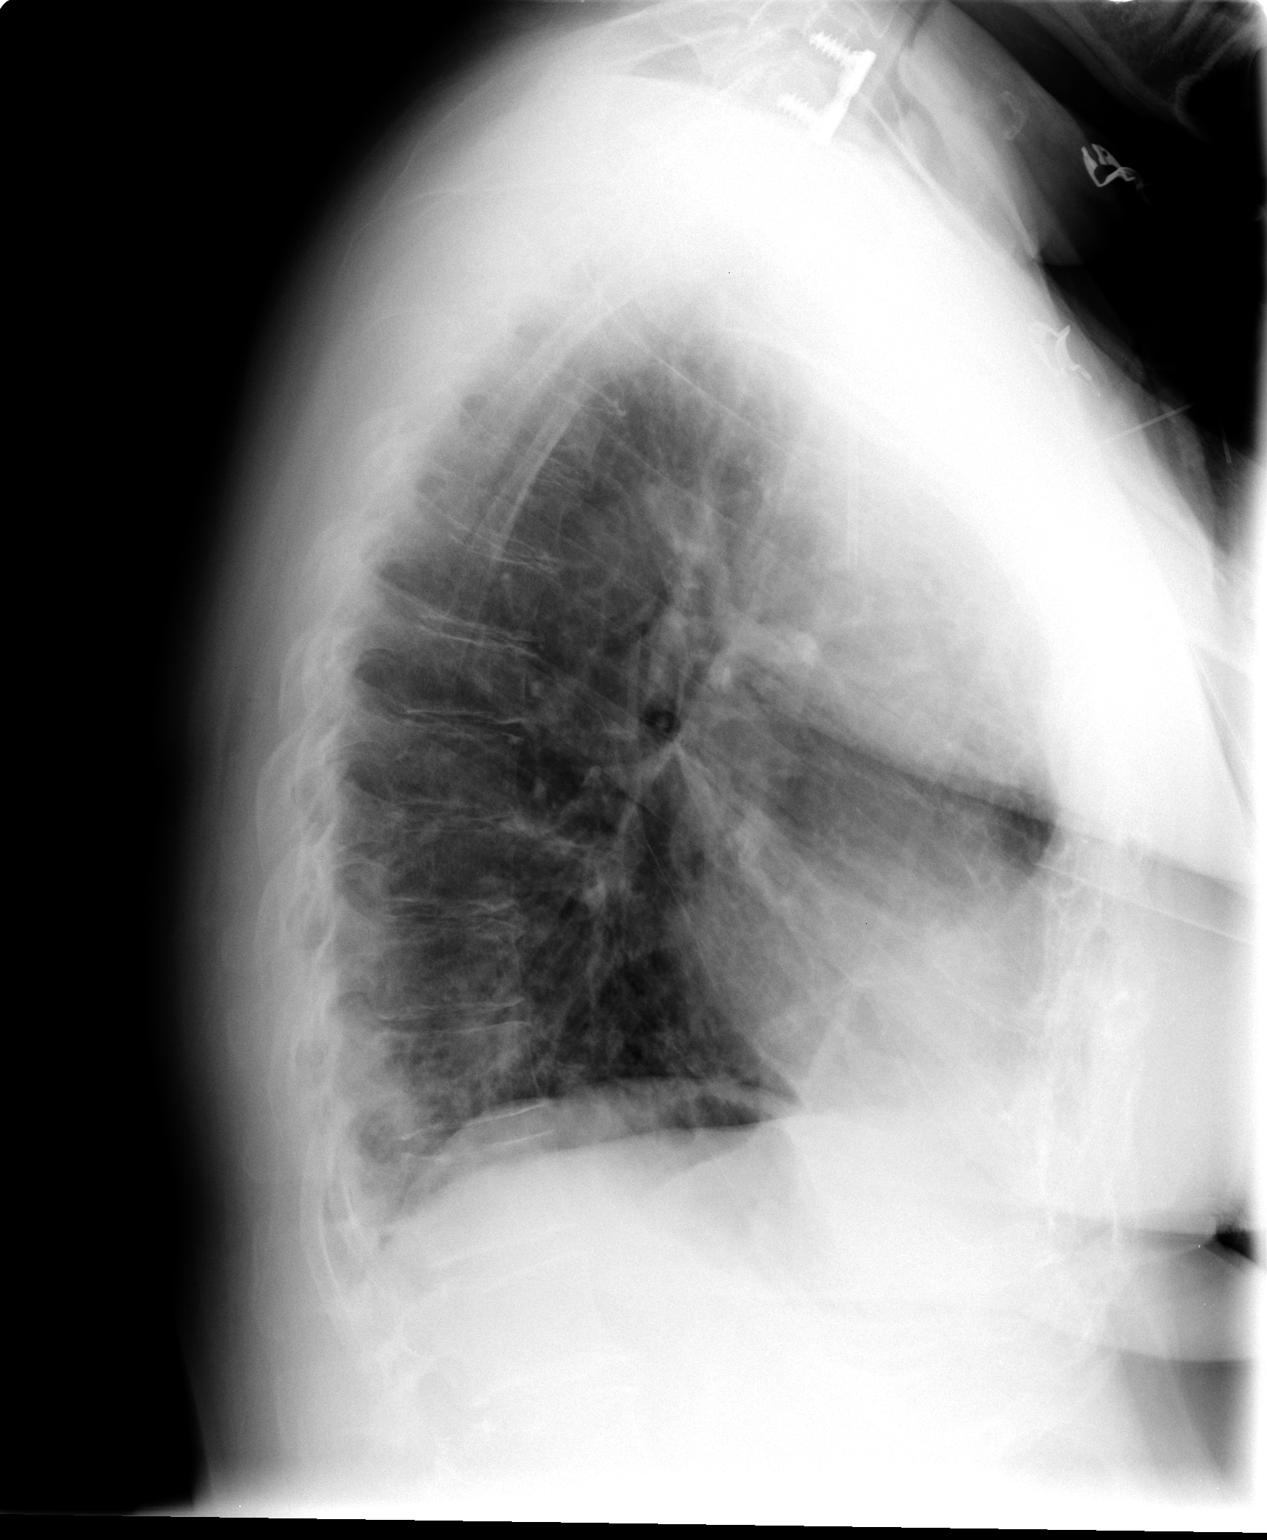

[2 of 2 positions shown; findings below may reference images not displayed]

FINDINGS: The patient has a right-sided Port-A-Cath.  Tip overlies
the superior vena cava.  There are patchy opacities within the
lungs bilaterally, as seen on prior chest x-ray and chest CT.  No
new consolidations or pleural effusions are identified.  Mild
degenerate changes are seen in the spine.  The patient has had
prior cervical fusion.  No evidence for pneumothorax or acute
fracture.
IMPRESSION: 1.  Persistent patchy infiltrates in the lungs, not significantly
changed.
2.  No edema.

## 2011-09-15 IMAGING — CR DG SHOULDER 2+V*L*
3 series · 3 of 3 positions shown · non-contrast
Comparison: Chest x-ray

CLINICAL DATA: Fall.  Weakness.  Difficulty elevating arms for
chest x-ray.

LEFT SHOULDER - 2+ VIEW

[view not recorded (1 of 3)]
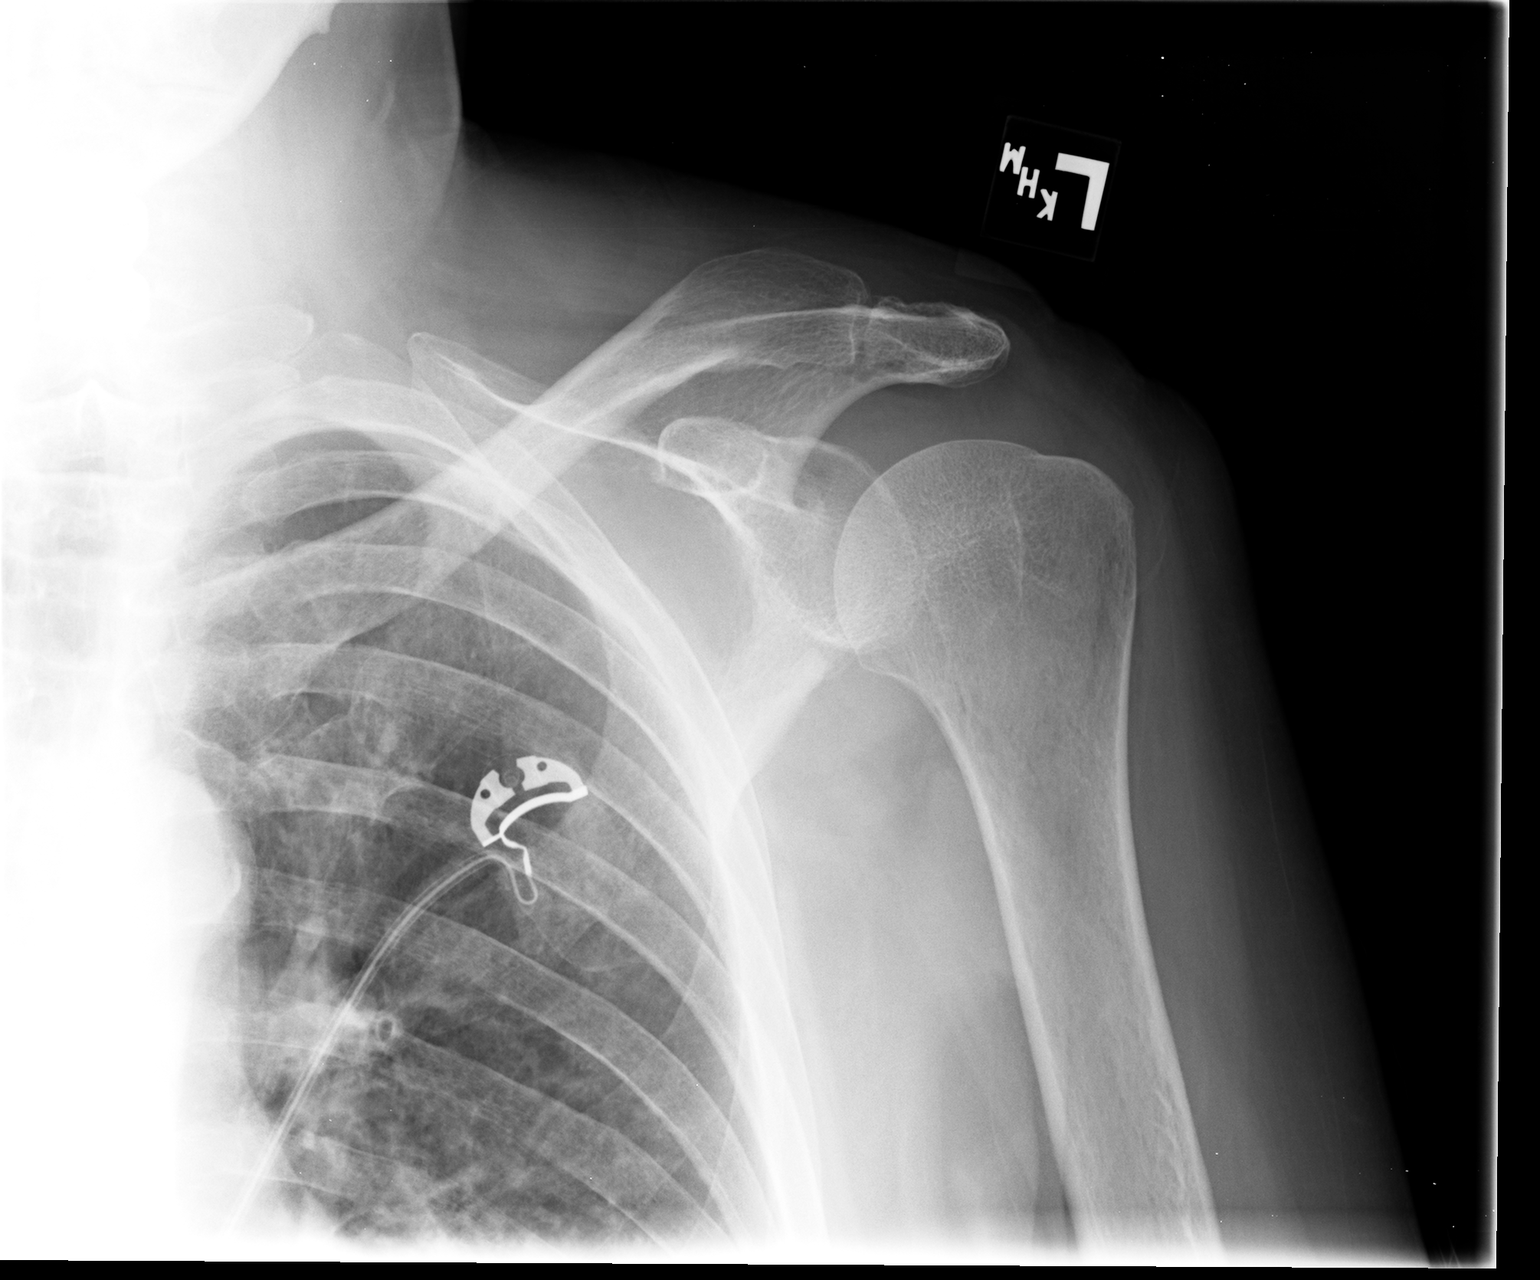

[view not recorded (2 of 3)]
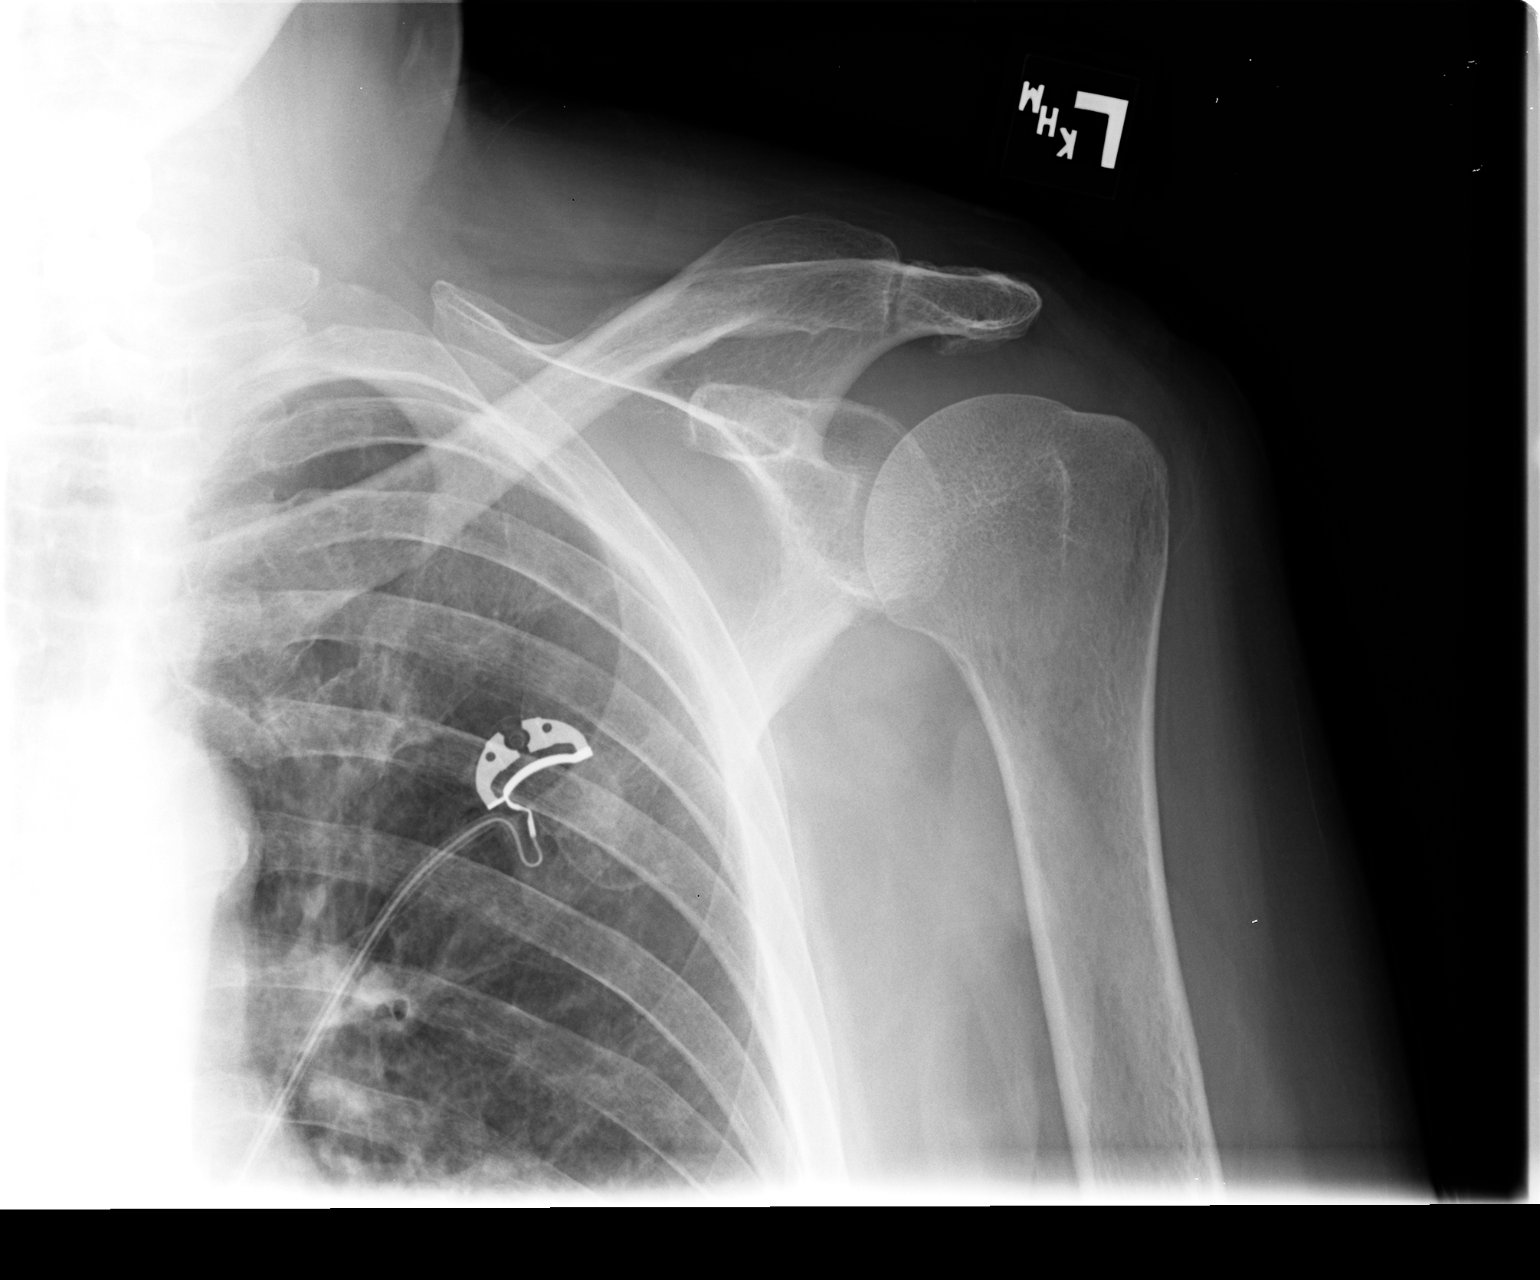

[view not recorded (3 of 3)]
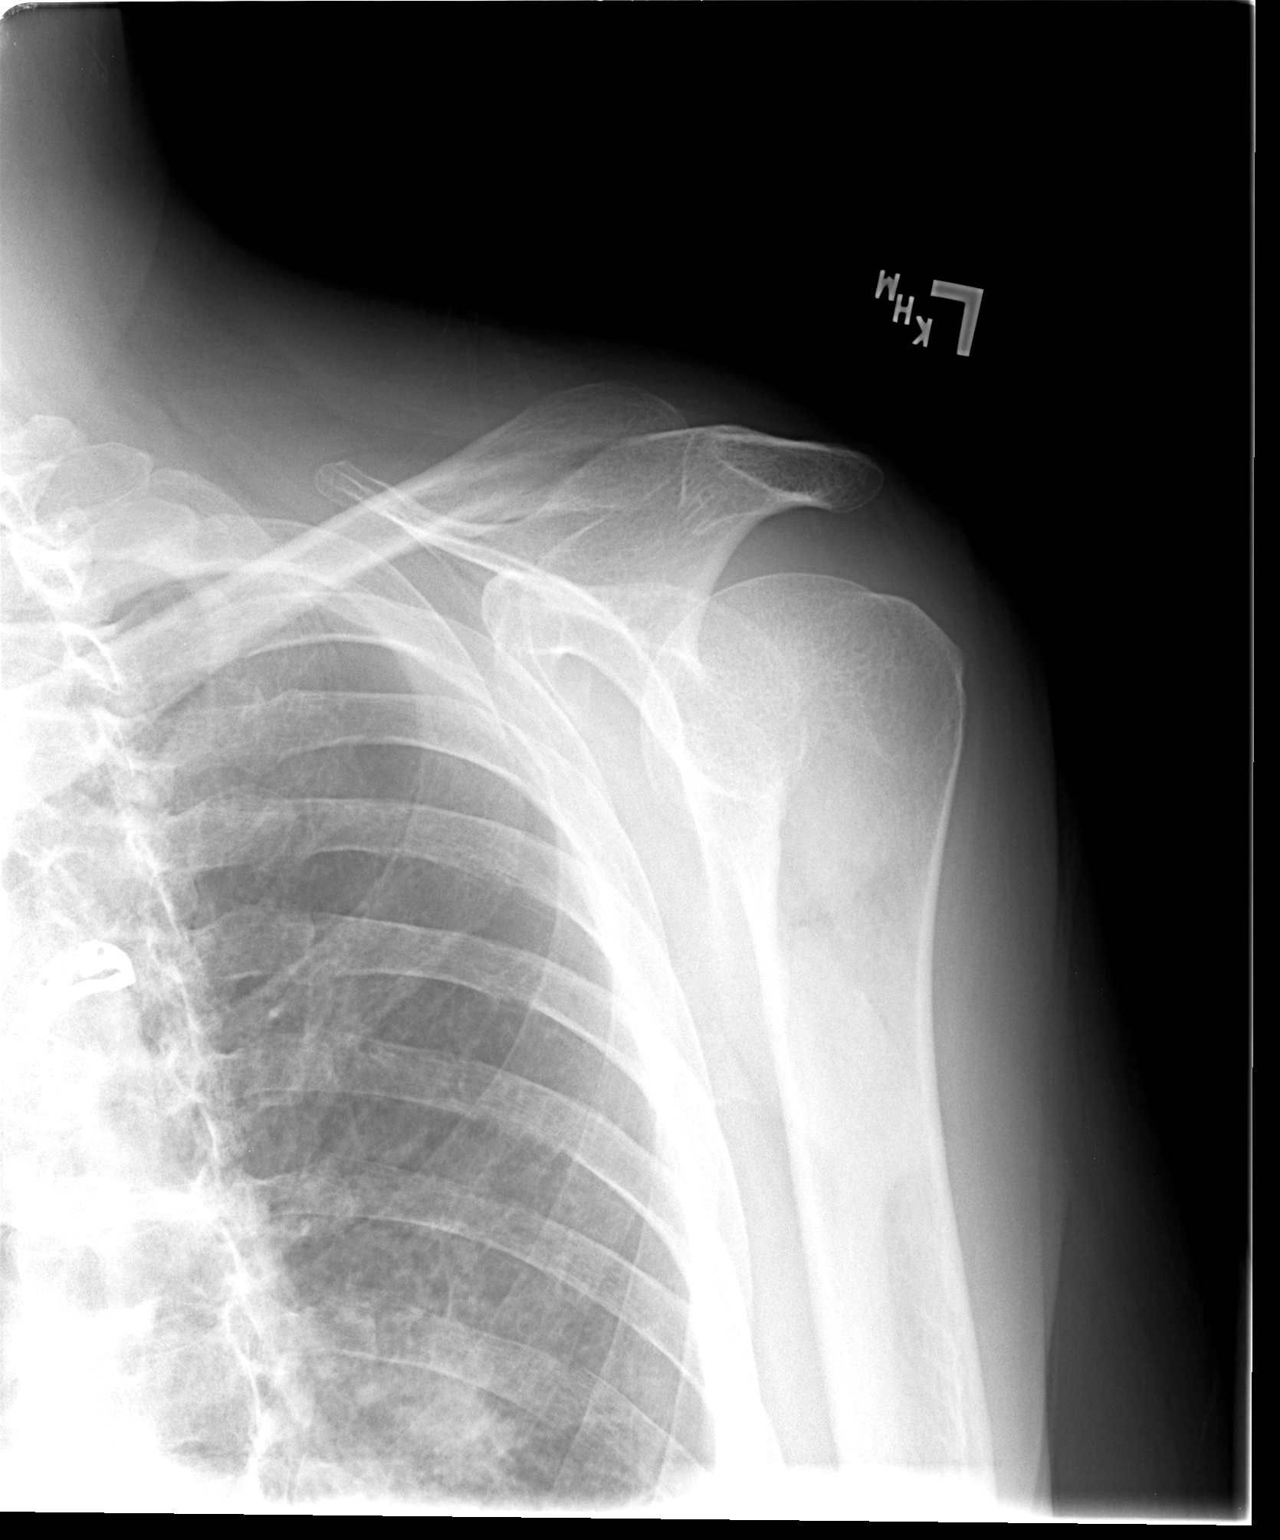

[3 of 3 positions shown; findings below may reference images not displayed]

FINDINGS: There is no evidence for acute fracture or dislocation.
No soft tissue foreign body or gas identified.
IMPRESSION: Negative exam.

## 2011-09-15 IMAGING — CT CT ANGIO CHEST
2 of 6 series · 19 of 36 positions shown · IV contrast (APPLIED)
Comparison: 12/31/2009

CLINICAL DATA: Non-Hodgkins lymphoma.  Aspergillosis.  Hypoxia.
On Coumadin.  Syncope.

CT ANGIOGRAPHY CHEST WITH CONTRAST
TECHNIQUE: Multidetector CT imaging of the chest was performed
using the standard protocol during bolus administration of
intravenous contrast.  Multiplanar CT image reconstructions
including MIPs were obtained to evaluate the vascular anatomy.
Contrast:  100 ml [id number]

[Series 8: pulm embolism 1.0 b25f thins · axial · 0.77mm/px · z∈[-280,-41]mm · 18 of 267 slices shown]
[im 14/267  lung]
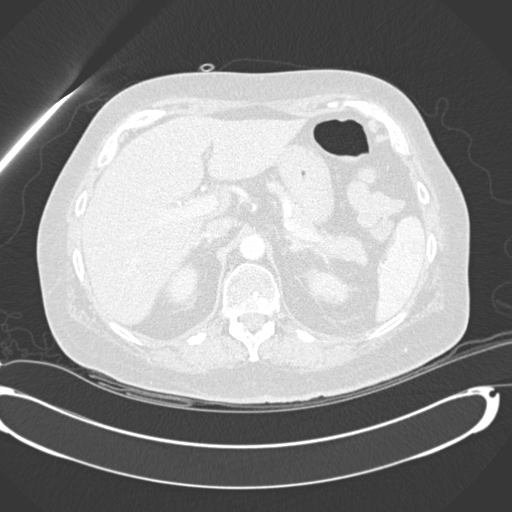
[im 27/267  mediastinal]
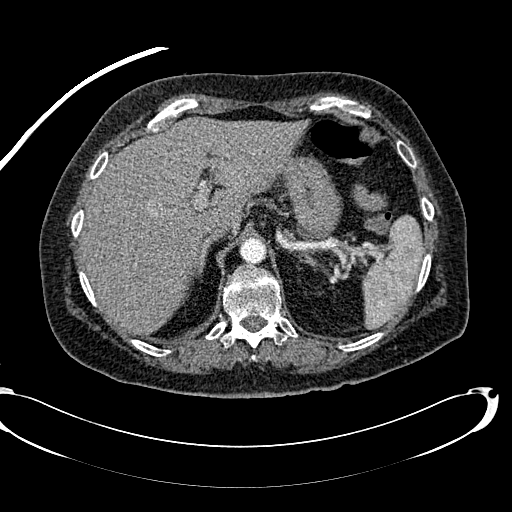
[im 40/267  lung]
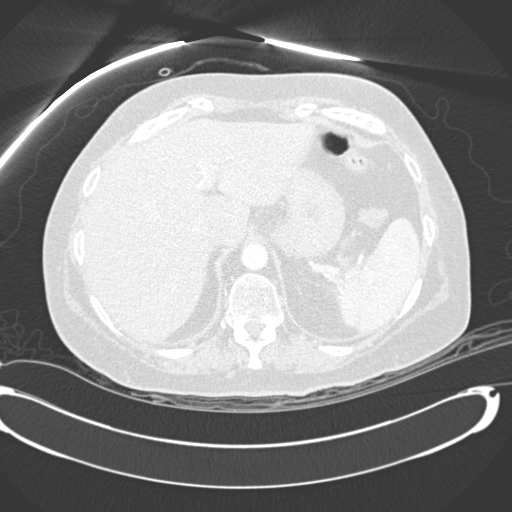
[im 54/267  mediastinal]
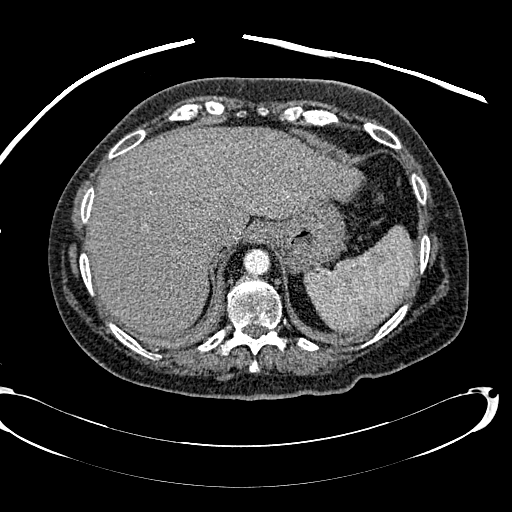
[im 67/267  lung]
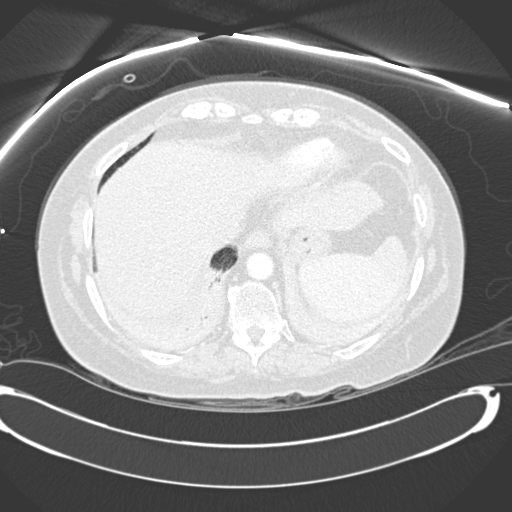
[im 80/267  mediastinal]
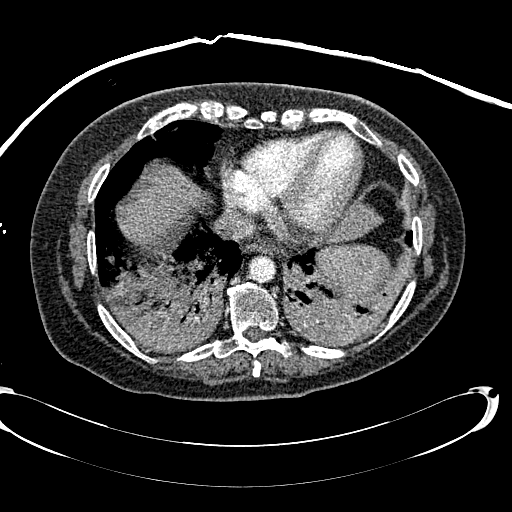
[im 94/267  lung]
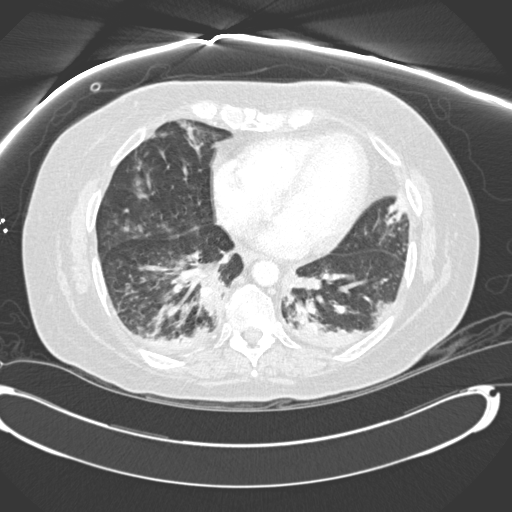
[im 107/267  mediastinal]
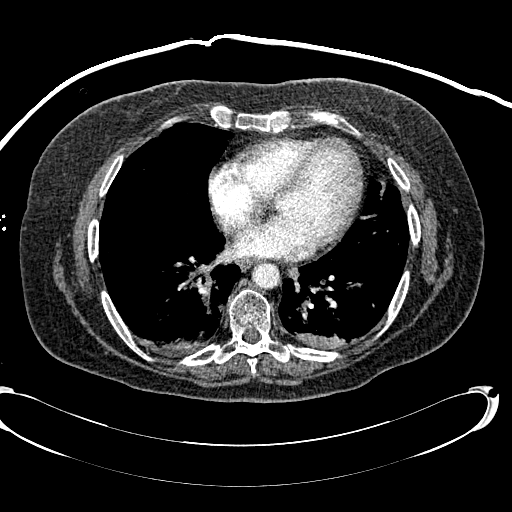
[im 120/267  lung]
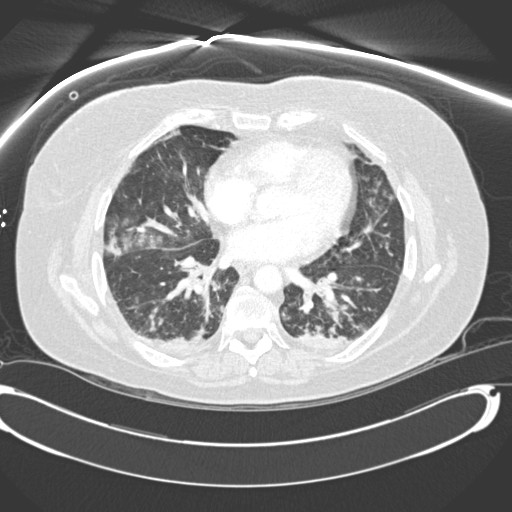
[im 147/267  mediastinal]
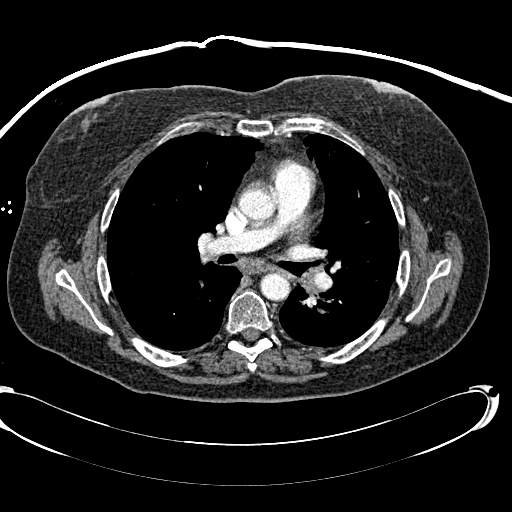
[im 160/267  lung]
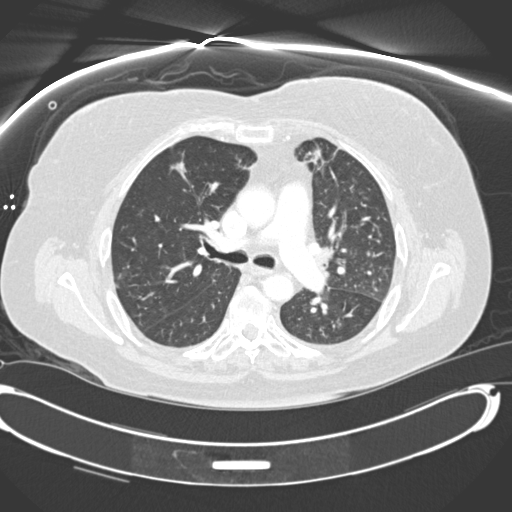
[im 173/267  mediastinal]
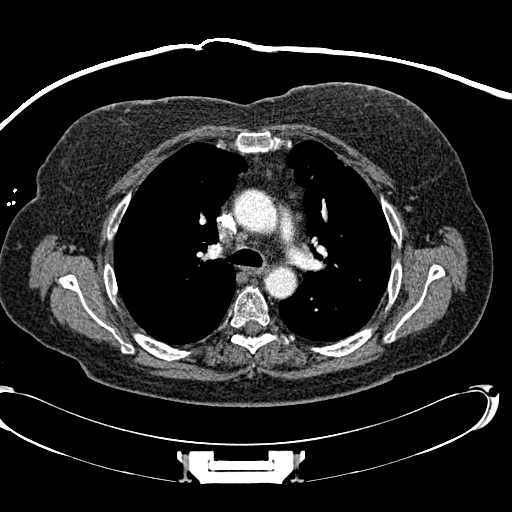
[im 187/267  lung]
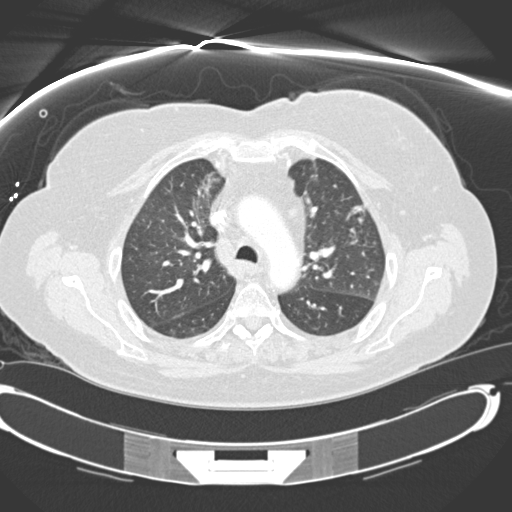
[im 200/267  mediastinal]
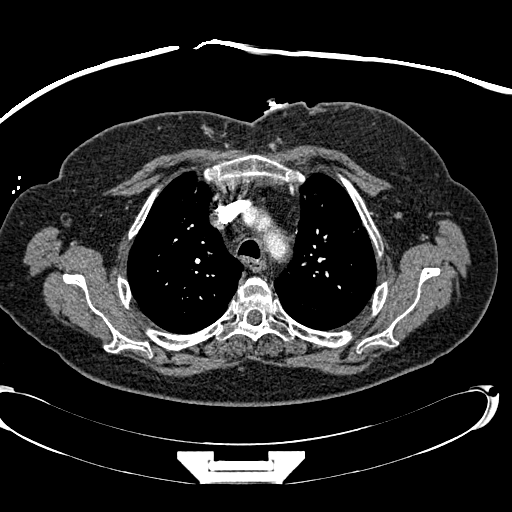
[im 213/267  lung]
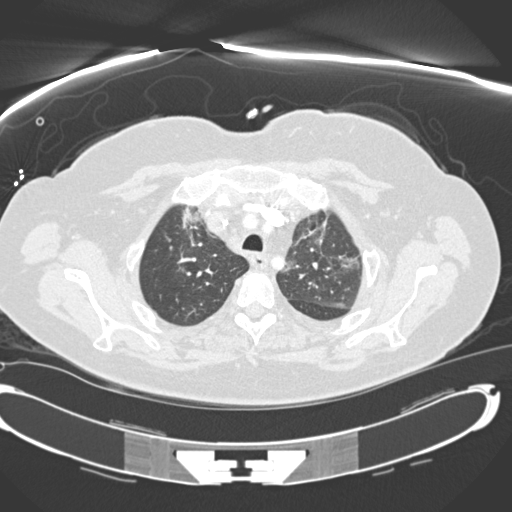
[im 227/267  mediastinal]
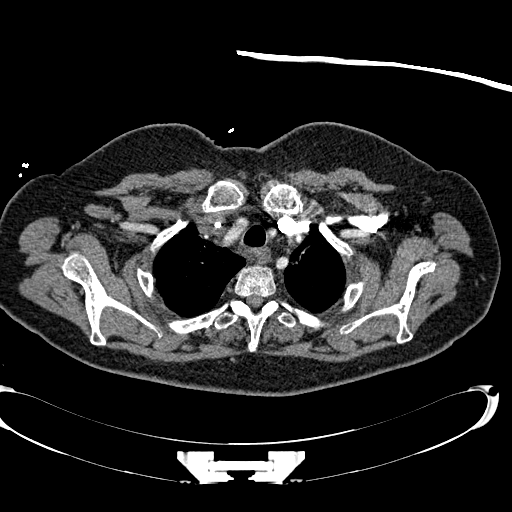
[im 240/267  lung]
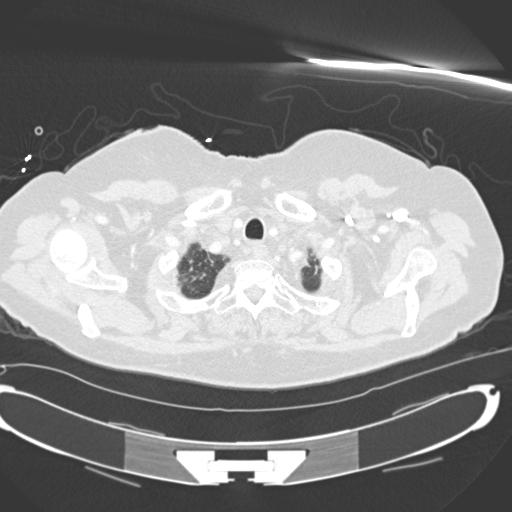
[im 253/267  mediastinal]
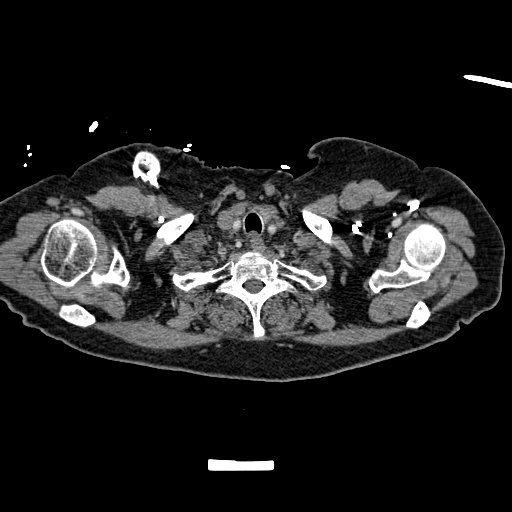

[Series 605: cor mpr · coronal · 0.77mm/px · 1 of 131 slices shown]
[im 66/131  mediastinal]
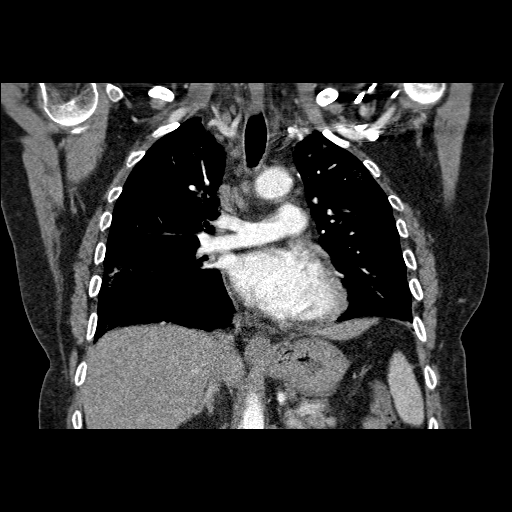

[19 of 36 positions shown; findings below may reference images not displayed]

FINDINGS: Satisfactory opacification of the pulmonary arteries
noted, and there is no evidence of pulmonary emboli.  Evaluation of
peripheral lower lobe pulmonary arteries is suboptimal due to
bilateral lower lobe airspace disease with air bronchograms which
is new since previous study.  Some fluid  seen within the bilateral
lower lobe bronchi.

There is no evidence of thoracic aortic aneurysm or dissection.
Shotty hilar lymph nodes are noted bilaterally which are likely
reactive.  No mediastinal  or other thoracic lymphadenopathy
identified.

There is no evidence of pleural or pericardial effusion.  In
addition to new bilateral lower lobe airspace disease described
above, there is mild airspace disease in the posterior portions of
the right upper and middle lobes which is new.  Bilateral upper
lung predominant pleural - purple scarring is stable.  No evidence
of bullous lung disease or aspergillomas.

Review of the MIP images confirms the above findings.
IMPRESSION: 1.  No evidence of acute pulmonary embolism.
2.  New bilateral airspace disease predominately involving both
lower lobes, suspicious for pneumonia.  No evidence of parenchymal
cavitation, bullous disease, or aspergillomas.
3.  Mild shotty bilateral hilar lymph nodes, which are likely
reactive.  No other lymphadenopathy or masses identified within the
thorax.

## 2011-09-15 IMAGING — CR DG SHOULDER 2+V*R*
3 series · 3 of 3 positions shown · non-contrast
Comparison: Chest x-ray

CLINICAL DATA: Weakness.  Fell at doctor's office.  Pain.

RIGHT SHOULDER - 2+ VIEW

[view not recorded (1 of 3)]
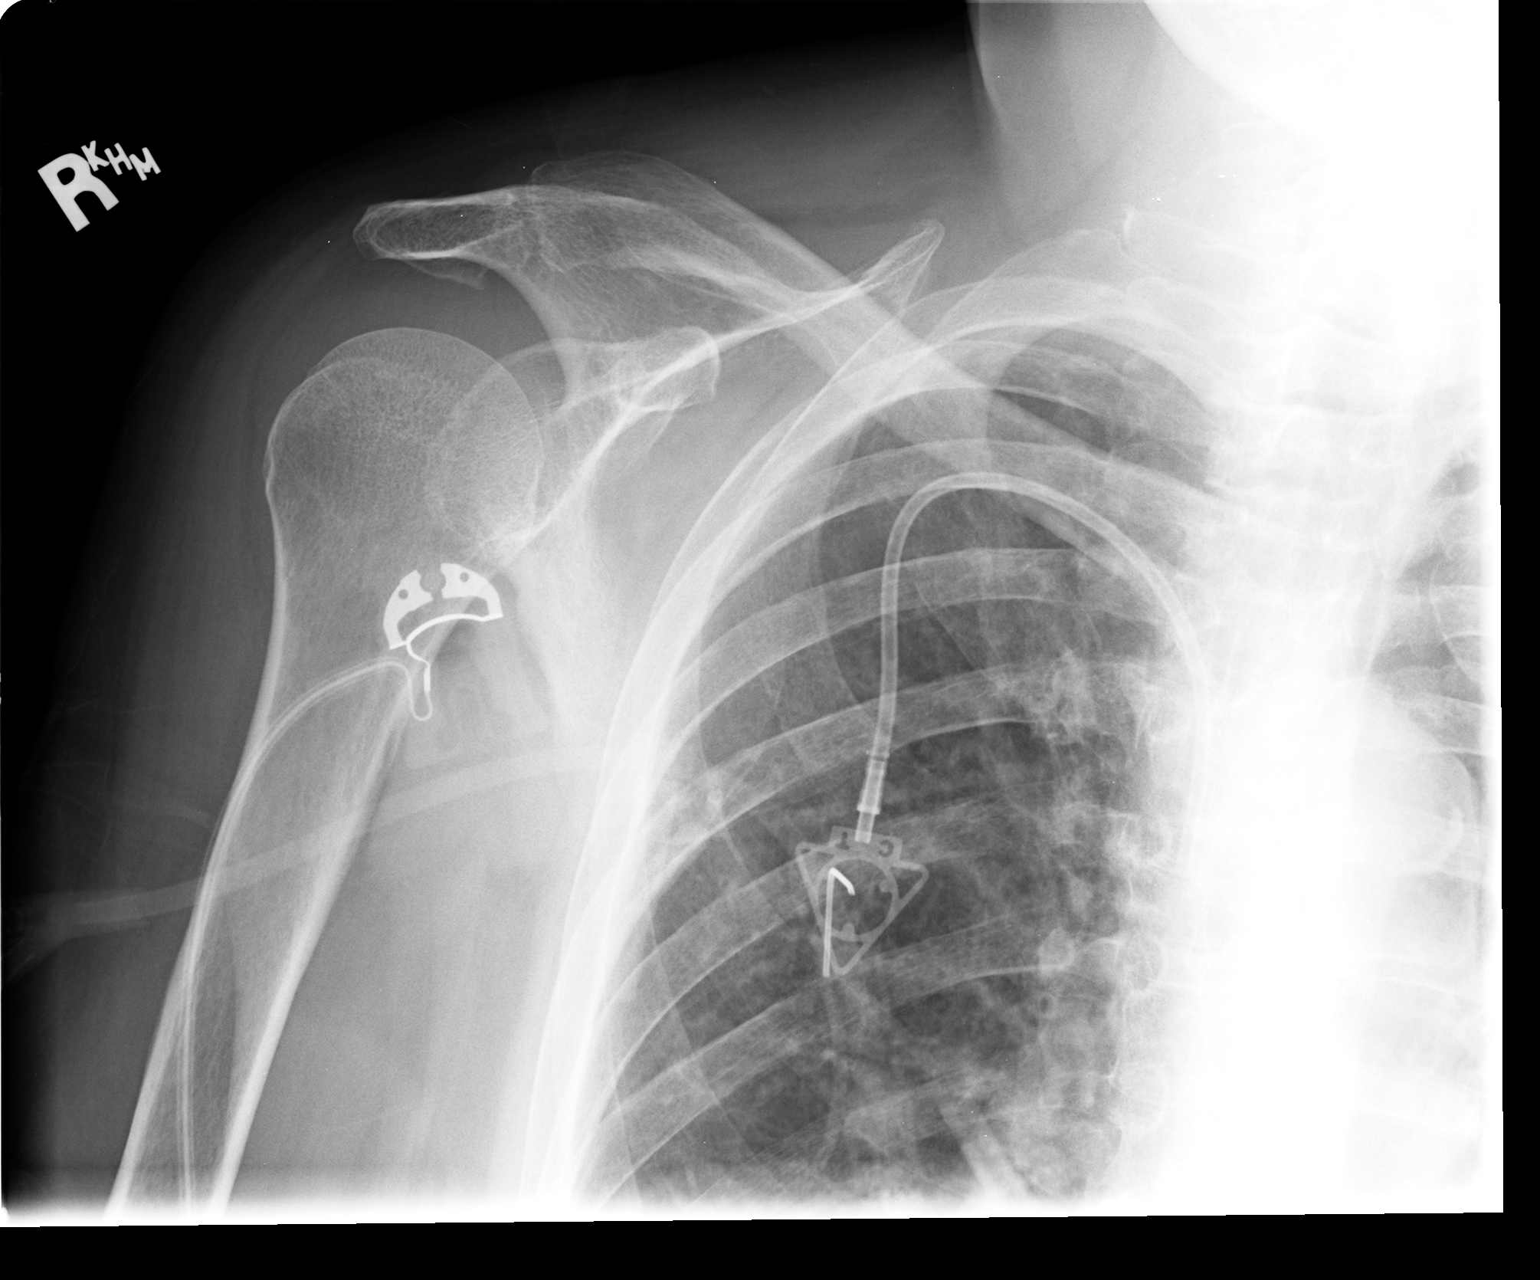

[view not recorded (2 of 3)]
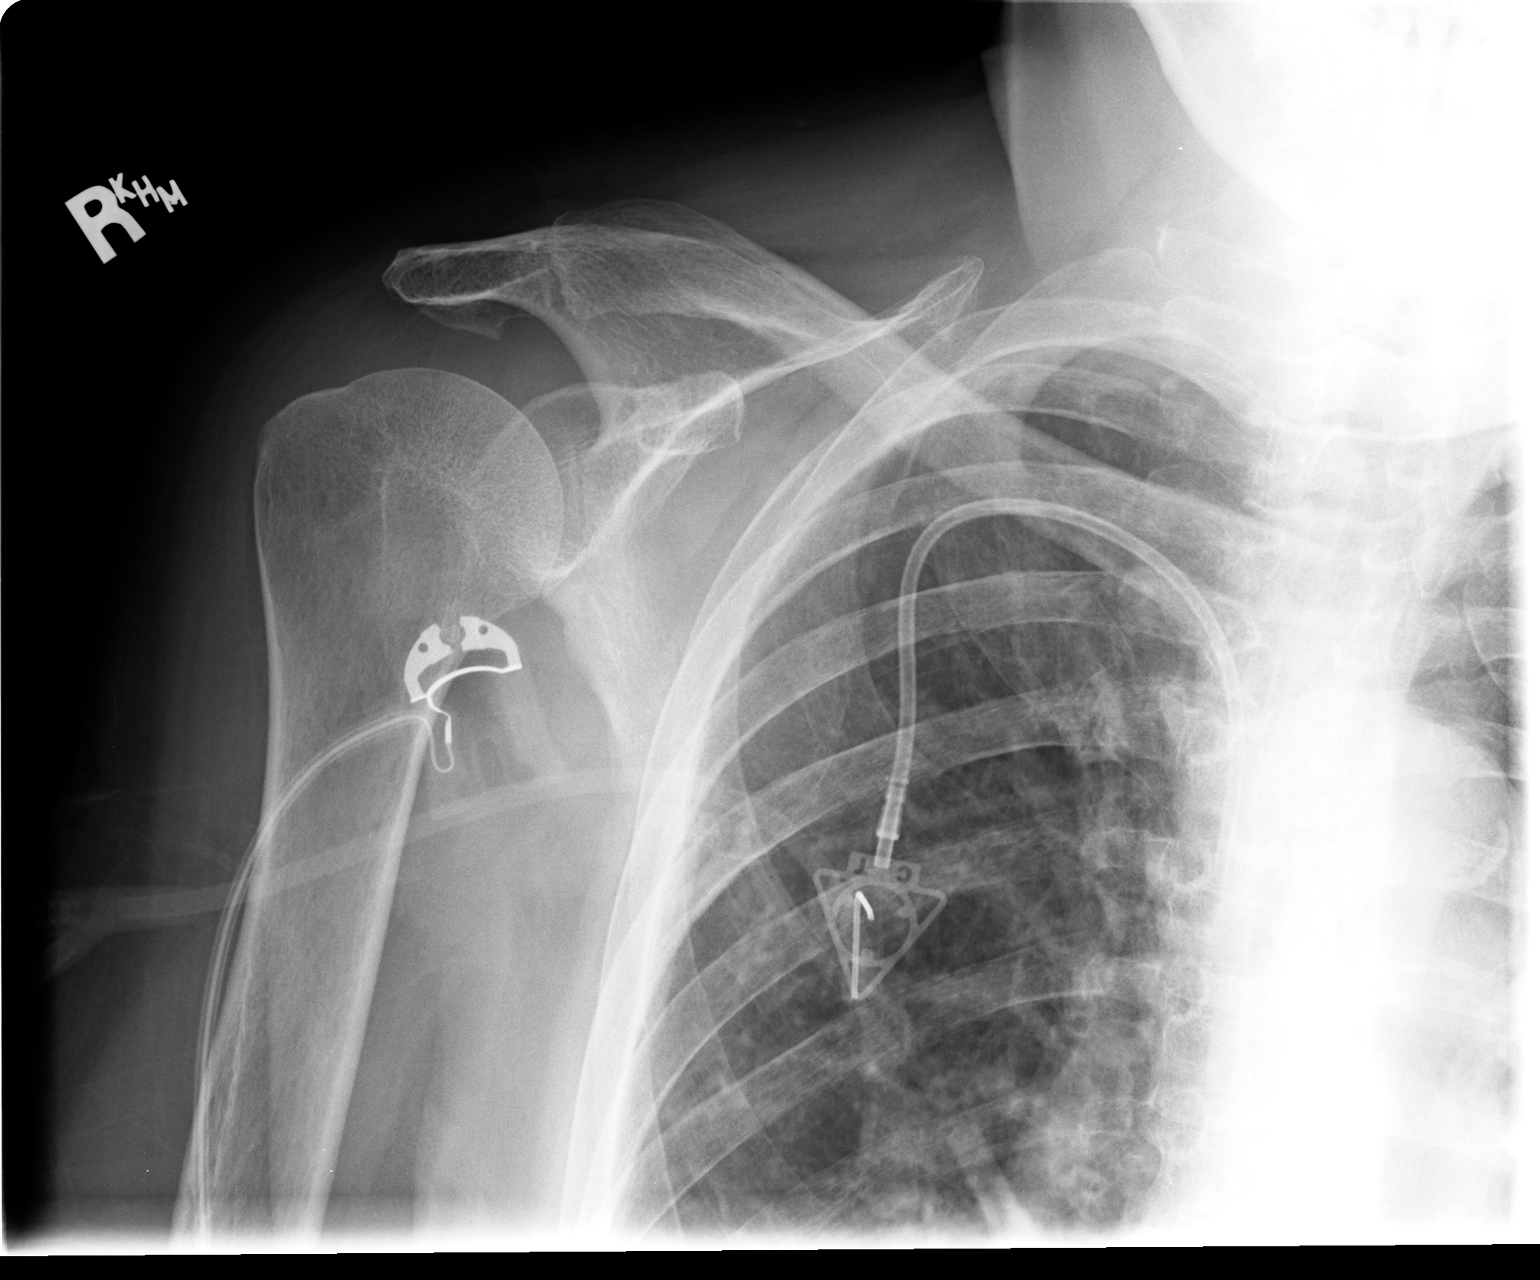

[view not recorded (3 of 3)]
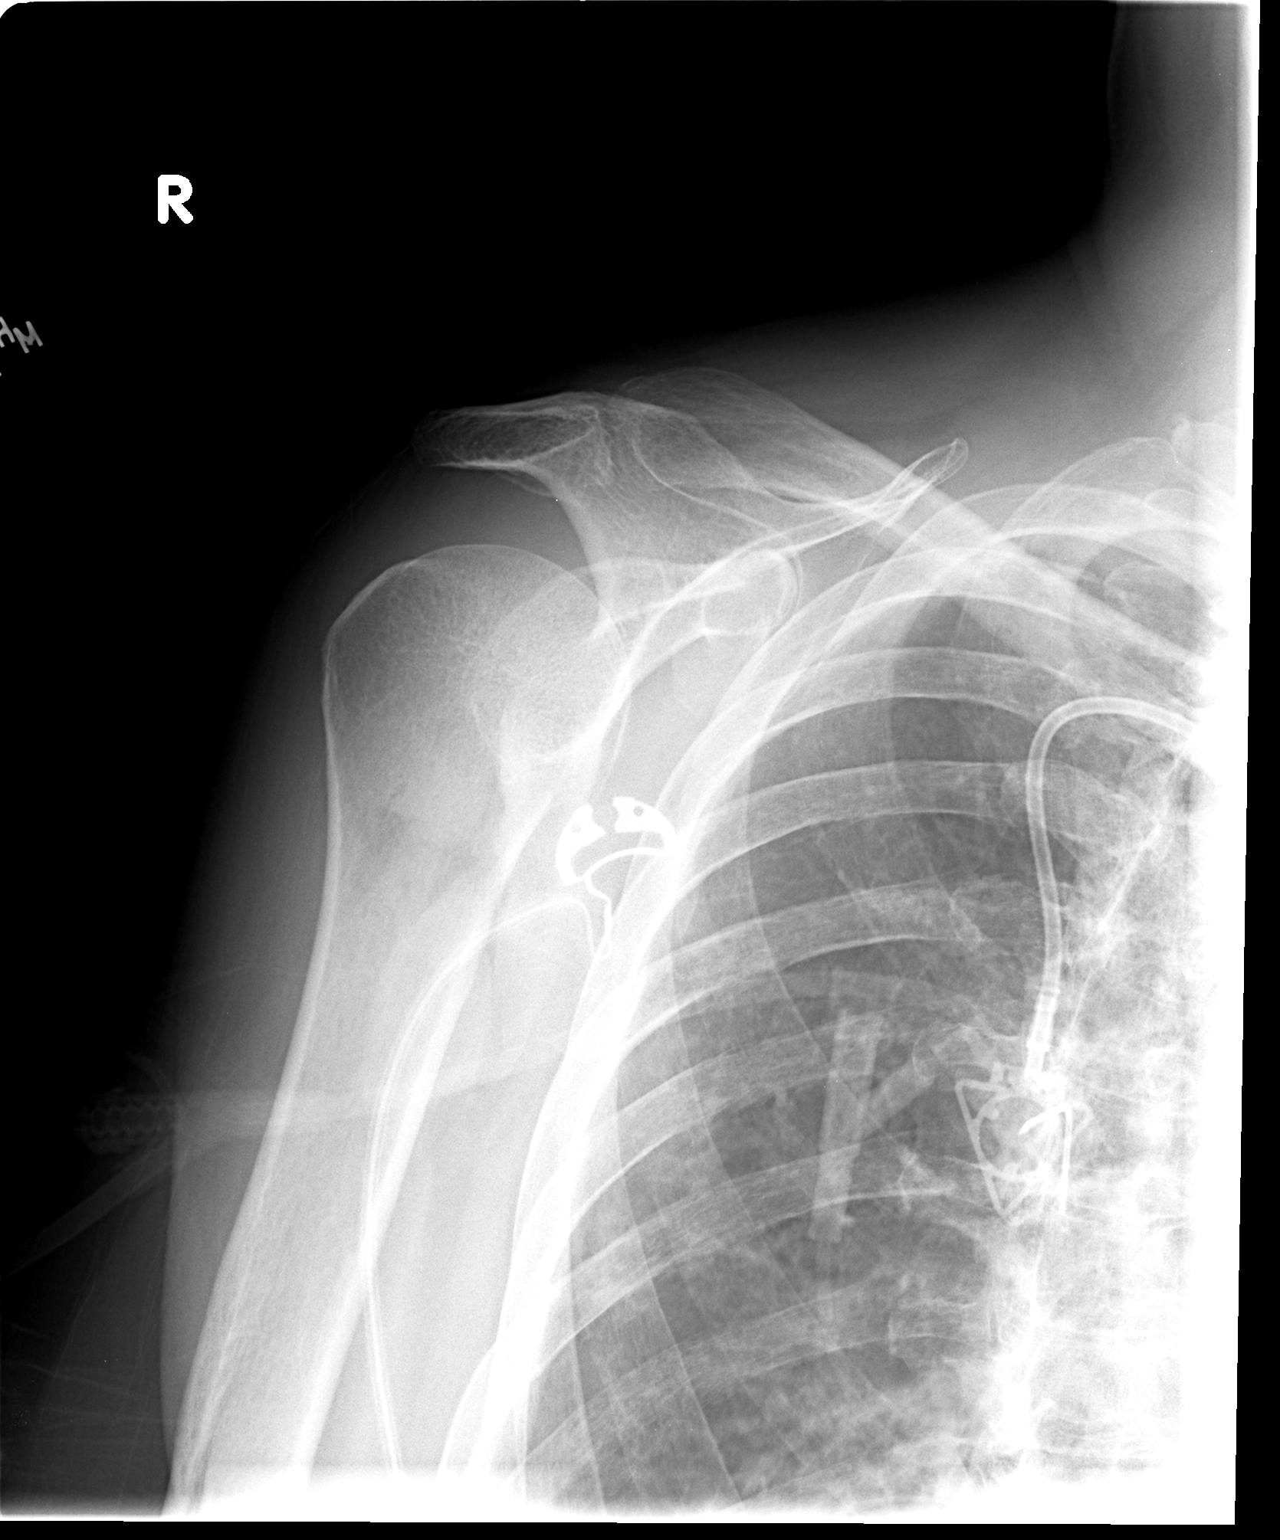

[3 of 3 positions shown; findings below may reference images not displayed]

FINDINGS: The patient has a right-sided Port-A-Cath. There is no
evidence for acute fracture or dislocation.  No soft tissue foreign
body or gas identified.
IMPRESSION: No evidence for acute  abnormality.

## 2011-09-15 IMAGING — CT CT HEAD W/O CM
3 of 6 series · 16 of 47 positions shown, 19 images · non-contrast
Comparison: 11/06/2009

CT HEAD

CLINICAL DATA: Syncope.  Non-Hodgkins lymphoma.  On Coumadin.
Hypertension and diabetes.

CT HEAD WITHOUT CONTRAST
CT CERVICAL SPINE WITHOUT CONTRAST
TECHNIQUE: Multidetector CT imaging of the head and cervical spine
was performed following the standard protocol without intravenous
contrast.  Multiplanar CT image reconstructions of the cervical
spine were also generated.

[Series 602: sag csp · sagittal · 0.45mm/px · 3 of 63 slices shown]
[im 21/63  brain]
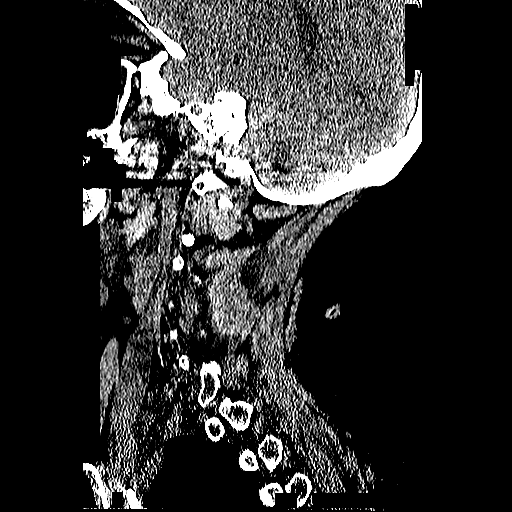
[im 32/63  brain]
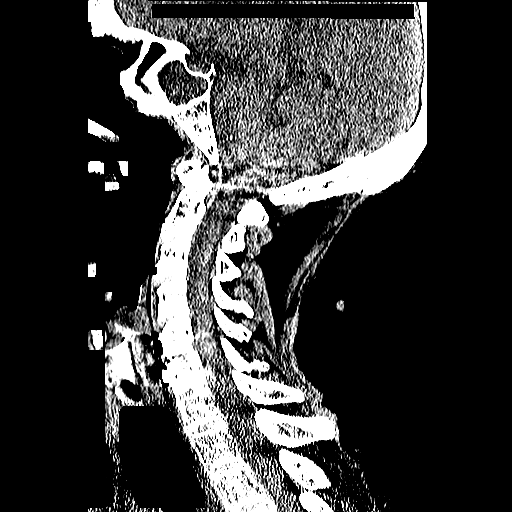
[im 42/63  brain]
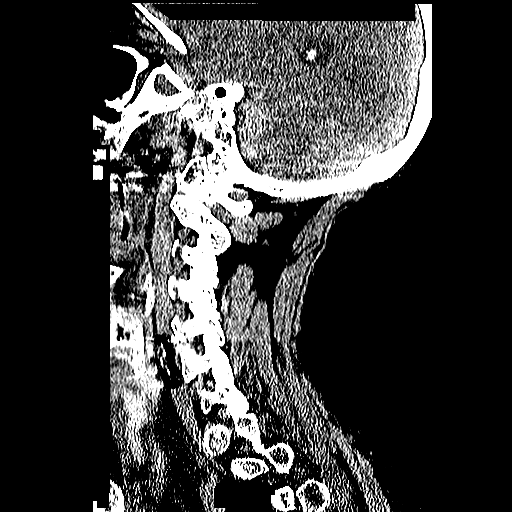

[Series 603: ortho ax · axial · 0.45mm/px · z∈[-323,-140]mm · 10 of 112 slices shown, 13 images]
[im 10/112  brain]
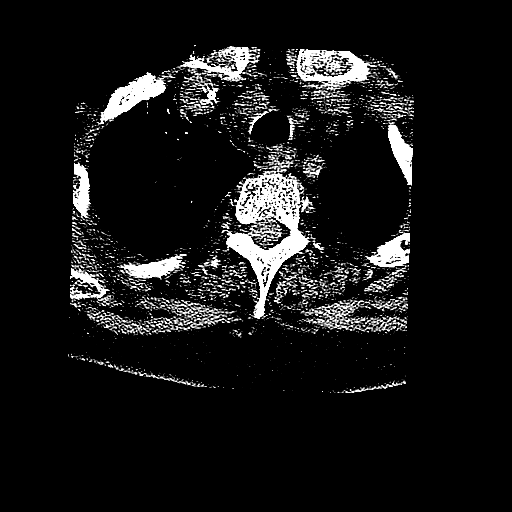
[im 10/112  bone]
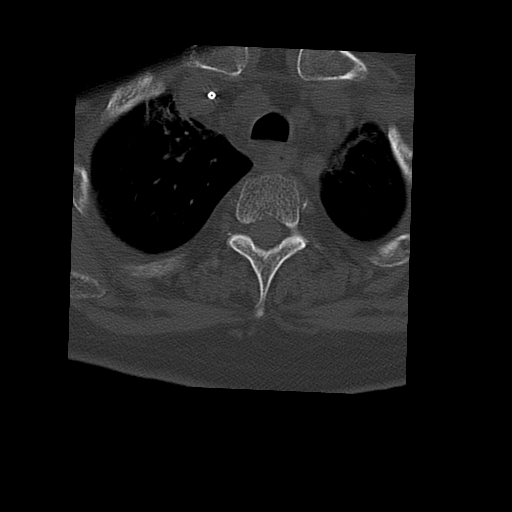
[im 19/112  brain]
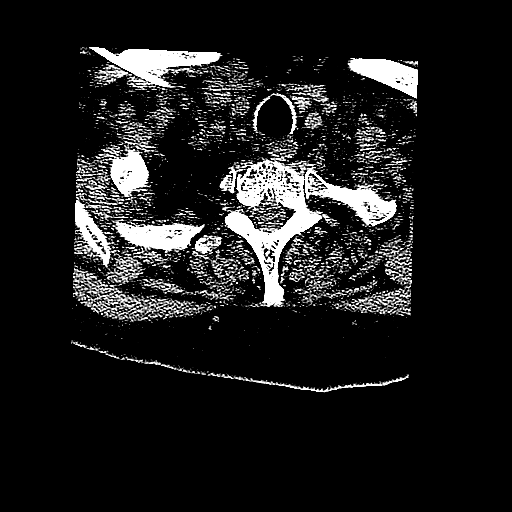
[im 28/112  brain]
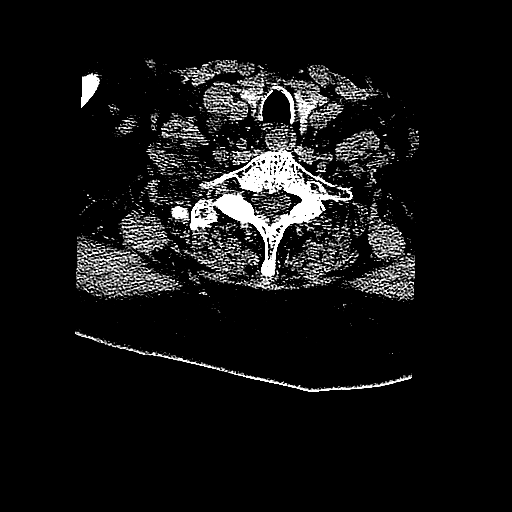
[im 38/112  brain]
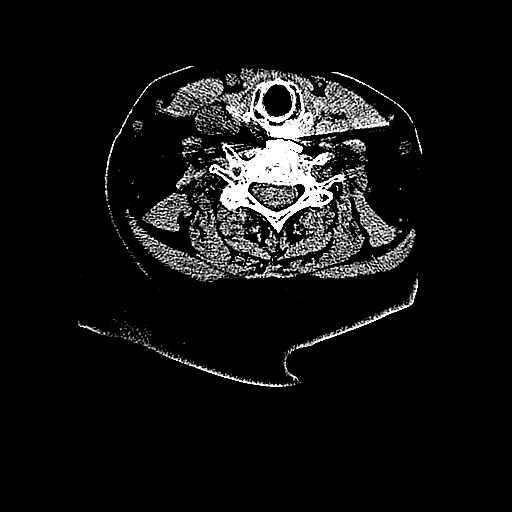
[im 47/112  brain]
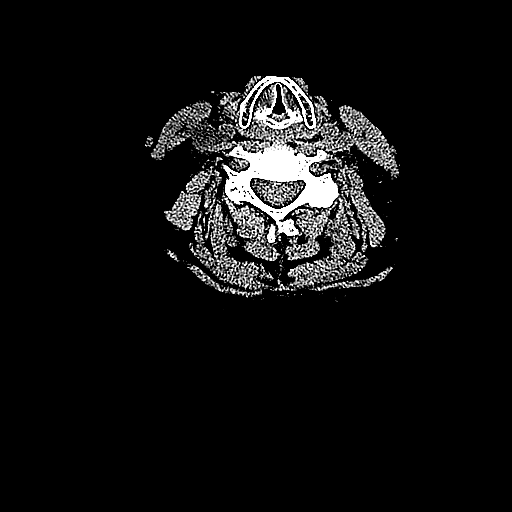
[im 47/112  bone]
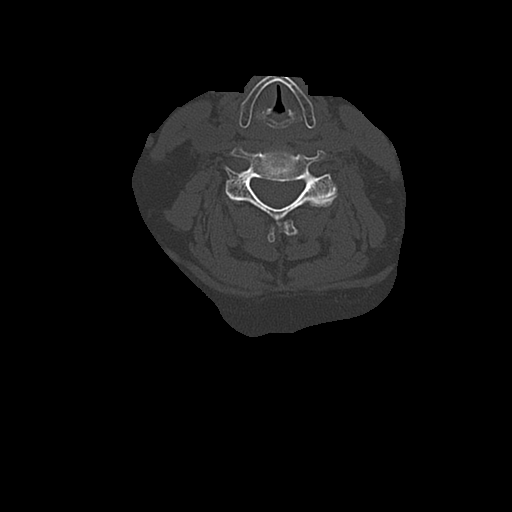
[im 65/112  brain]
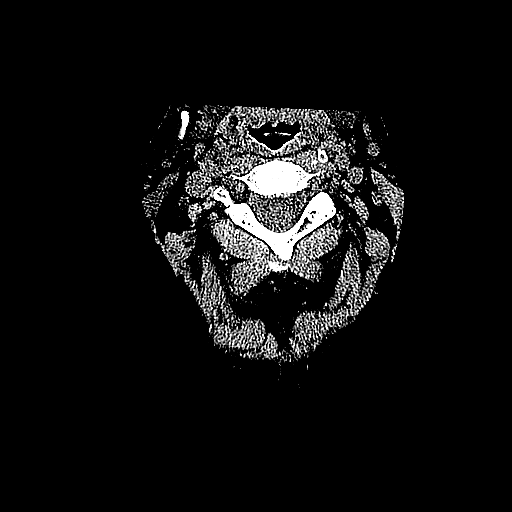
[im 75/112  brain]
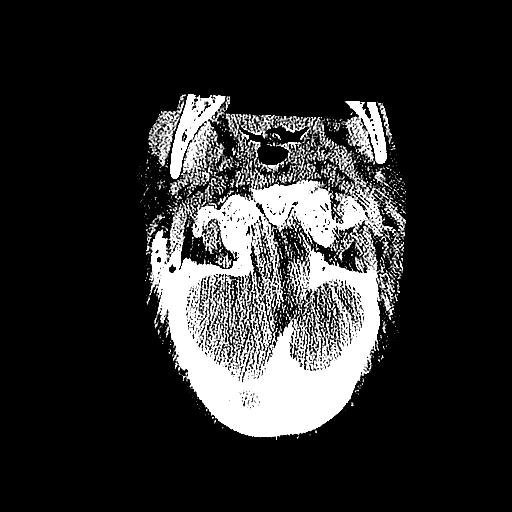
[im 84/112  brain]
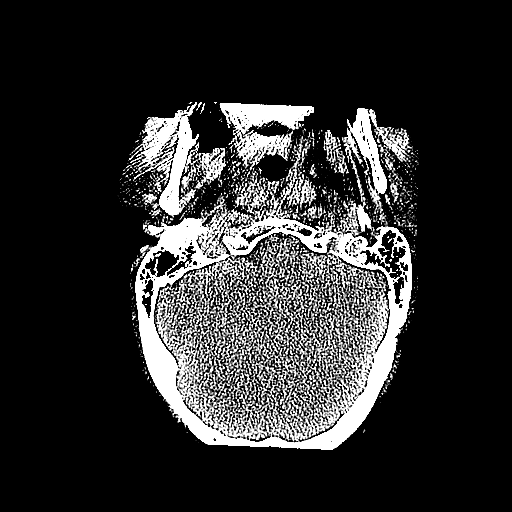
[im 93/112  brain]
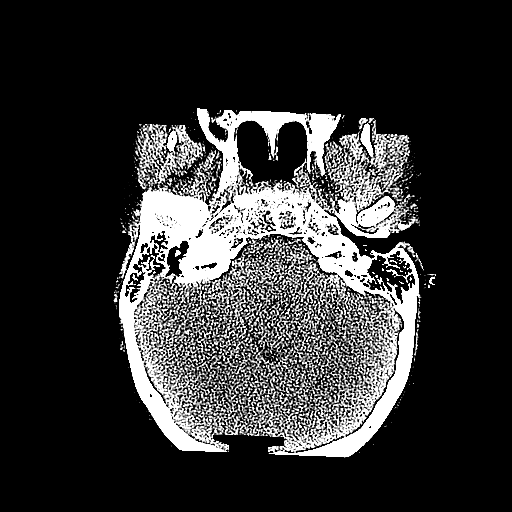
[im 93/112  bone]
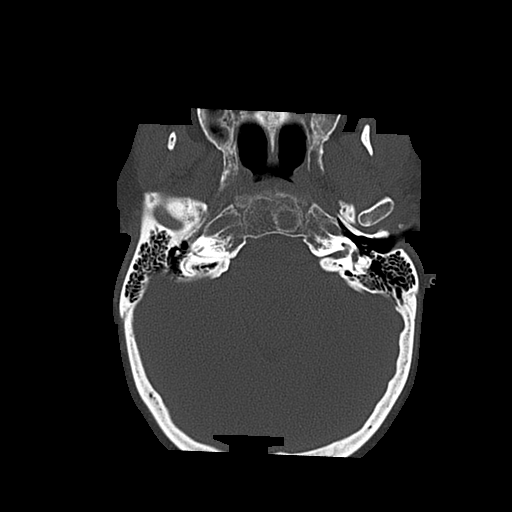
[im 102/112  brain]
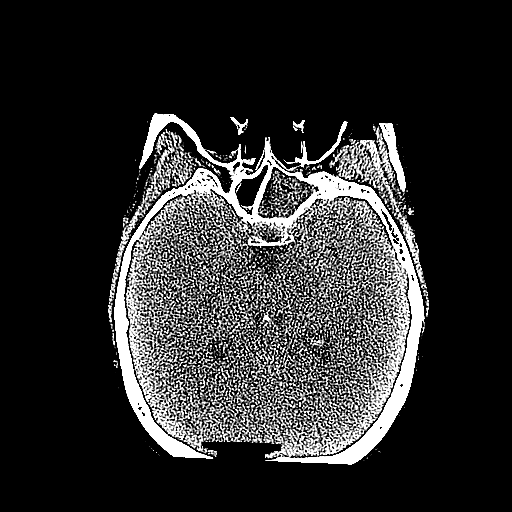

[Series 605: cor 2 · coronal · 0.45mm/px · 3 of 69 slices shown]
[im 18/69  brain]
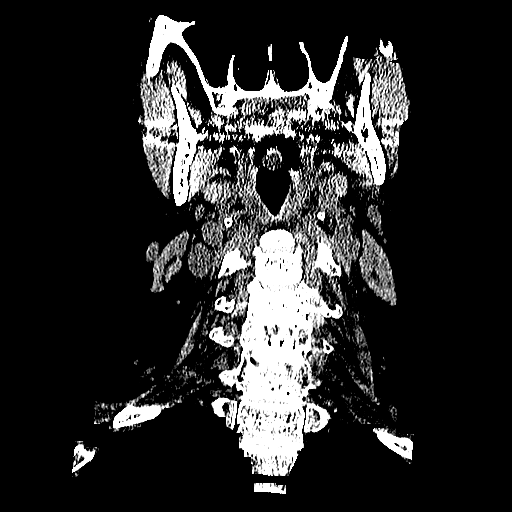
[im 35/69  brain]
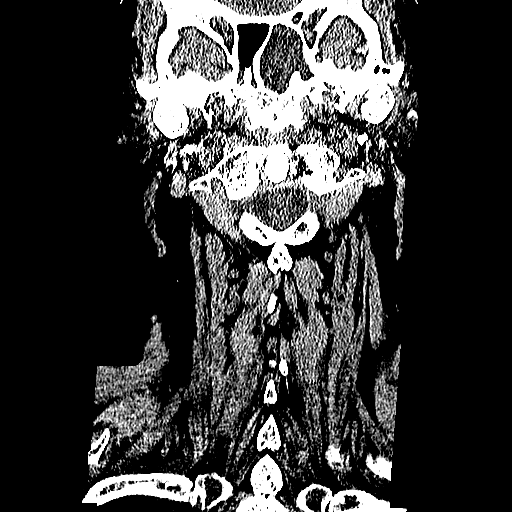
[im 52/69  brain]
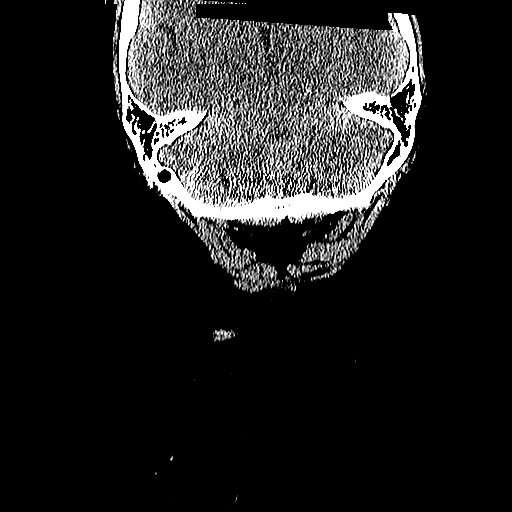

[16 of 47 positions shown; findings below may reference images not displayed]

FINDINGS: There is no evidence of intracranial hemorrhage, brain
edema or other signs of acute infarction.  There is no evidence of
intracranial mass lesion or mass effect.  No abnormal extra-axial
fluid collections are identified.

Ventricles are normal in size.  No other intracranial abnormality
identified.  No skull lesions identified.  Sphenoid and right
maxillary sinus disease is again seen.  This is improved since
previous study.  Previous ethmoidectomies and bilateral maxillary
antrectomy is noted.
IMPRESSION: 1.  No acute intracranial abnormality.
2.  Chronic sinusitis, which appears improved compared to previous
study.

CT CERVICAL SPINE
FINDINGS: No evidence of acute fracture, subluxation, or
prevertebral soft tissue swelling.

Anterior cervical fusion hardware is seen at C5-6, and mature
appearing effusion is also seen at C6-7.  The other cervical disc
spaces are maintained.  Mild degenerative spurring is seen at C1-2
and involving facet joints bilaterally.
IMPRESSION: 1.  No evidence of cervical spine fracture or subluxation.
2.  Previous lower cervical spine fusions and cervical spondylosis.

## 2011-09-16 ENCOUNTER — Encounter (HOSPITAL_BASED_OUTPATIENT_CLINIC_OR_DEPARTMENT_OTHER): Payer: BC Managed Care – PPO

## 2011-09-16 VITALS — BP 124/69 | HR 75 | Temp 98.3°F | Resp 16 | Wt 178.4 lb

## 2011-09-16 DIAGNOSIS — Z5112 Encounter for antineoplastic immunotherapy: Secondary | ICD-10-CM

## 2011-09-16 DIAGNOSIS — C8589 Other specified types of non-Hodgkin lymphoma, extranodal and solid organ sites: Secondary | ICD-10-CM

## 2011-09-16 IMAGING — CR DG CHEST 1V PORT
1 series · 1 of 1 positions shown · non-contrast
Comparison: 01/23/2010

CLINICAL DATA: Pneumonia, sepsis.

PORTABLE CHEST - 1 VIEW

[AP]
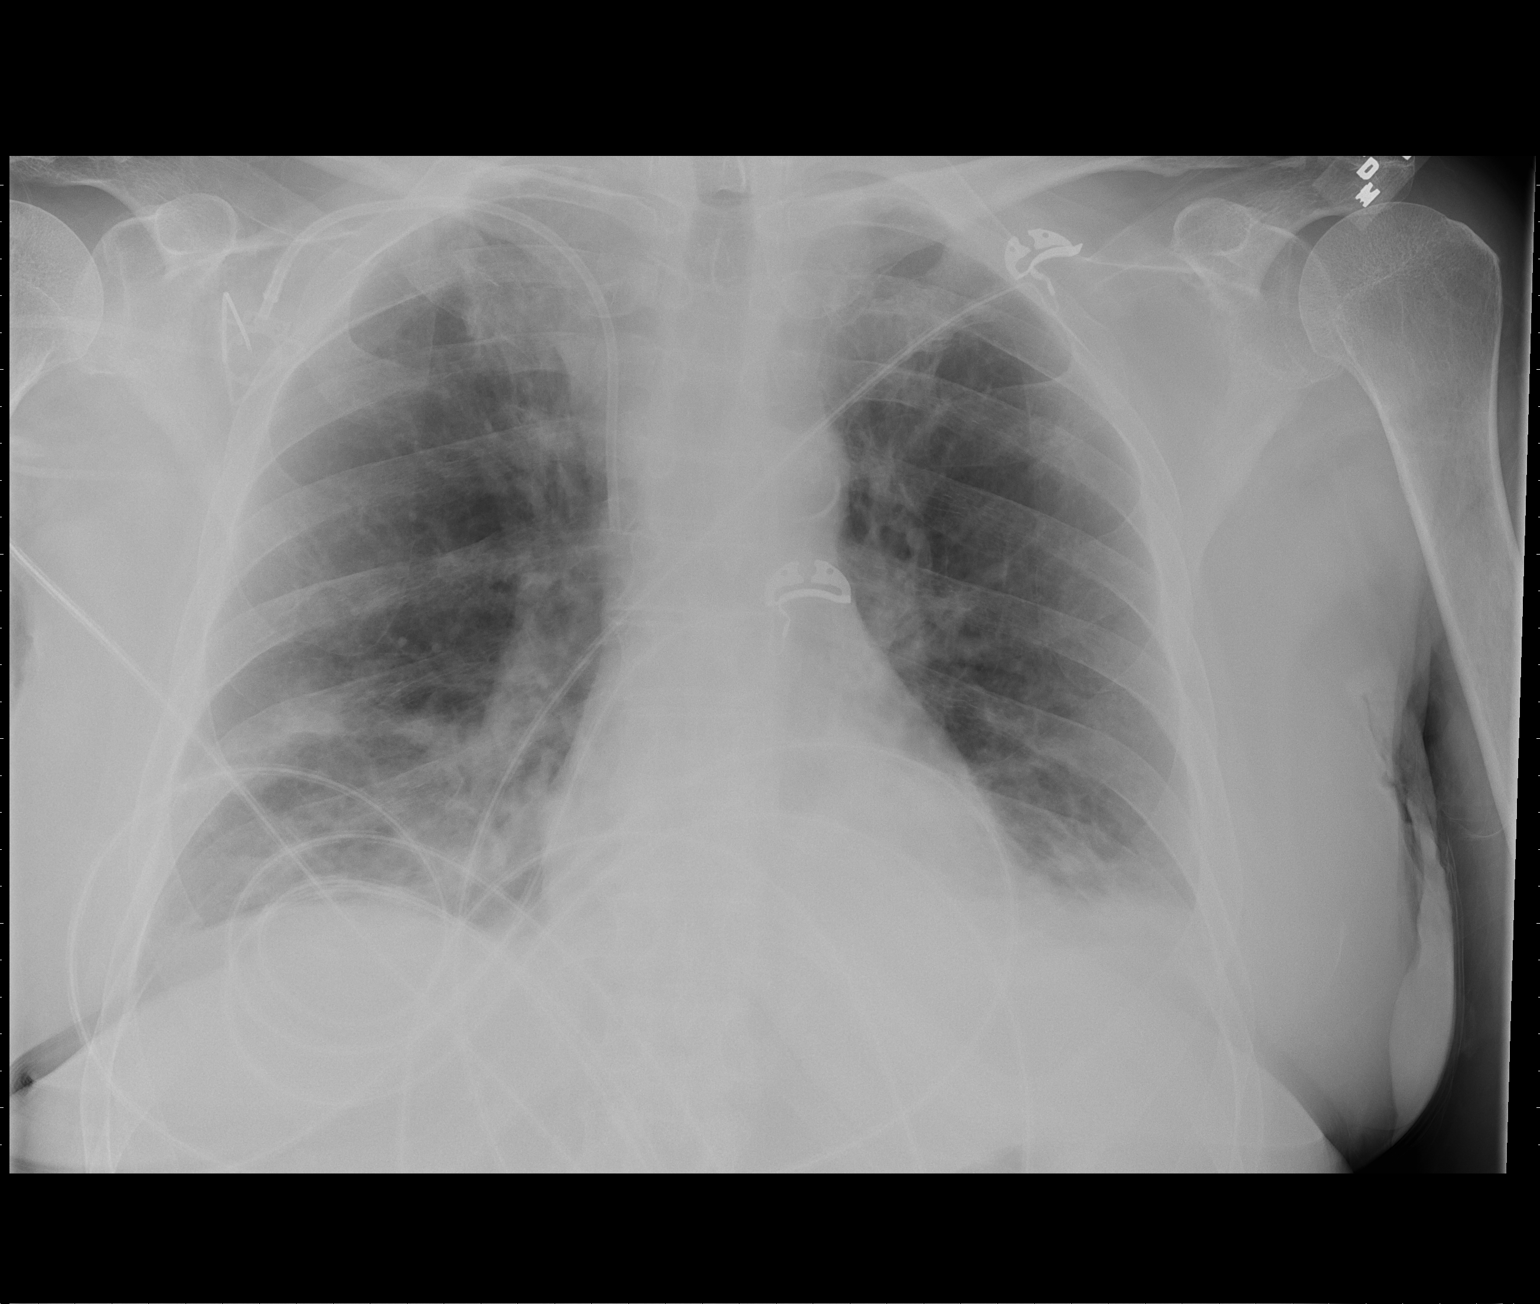

[1 of 1 positions shown; findings below may reference images not displayed]

FINDINGS: Right Port-A-Cath tip overlies the level of superior vena
cava.  Heart size is normal.  Patchy bibasilar infiltrates are
identified, slightly increased since prior study.  Bilateral
pleural effusions are suspected.  There is perihilar bronchitic
change.  Patient has had prior cervical fusion.
IMPRESSION: Increased bibasilar infiltrates.

## 2011-09-16 MED ORDER — SODIUM CHLORIDE 0.9 % IJ SOLN
10.0000 mL | INTRAMUSCULAR | Status: DC | PRN
  Administered 2011-09-16: 10 mL
  Filled 2011-09-16: qty 10

## 2011-09-16 MED ORDER — HEPARIN SOD (PORK) LOCK FLUSH 100 UNIT/ML IV SOLN
500.0000 [IU] | Freq: Once | INTRAVENOUS | Status: DC | PRN
  Filled 2011-09-16: qty 5

## 2011-09-16 MED ORDER — SODIUM CHLORIDE 0.9 % IV SOLN
Freq: Once | INTRAVENOUS | Status: AC
  Administered 2011-09-16: 09:00:00 via INTRAVENOUS

## 2011-09-16 MED ORDER — SODIUM CHLORIDE 0.9 % IV SOLN
375.0000 mg/m2 | Freq: Once | INTRAVENOUS | Status: AC
  Administered 2011-09-16: 700 mg via INTRAVENOUS
  Filled 2011-09-16: qty 70

## 2011-09-16 MED ORDER — SODIUM CHLORIDE 0.9 % IJ SOLN
INTRAMUSCULAR | Status: AC
  Filled 2011-09-16: qty 10

## 2011-09-16 NOTE — Progress Notes (Signed)
Tolerated infusion well. 

## 2011-09-17 IMAGING — CR DG CHEST 1V PORT
1 series · 1 of 1 positions shown · non-contrast
Comparison: 01/24/2010

CLINICAL DATA: Pneumonia.

PORTABLE CHEST - 1 VIEW

[AP]
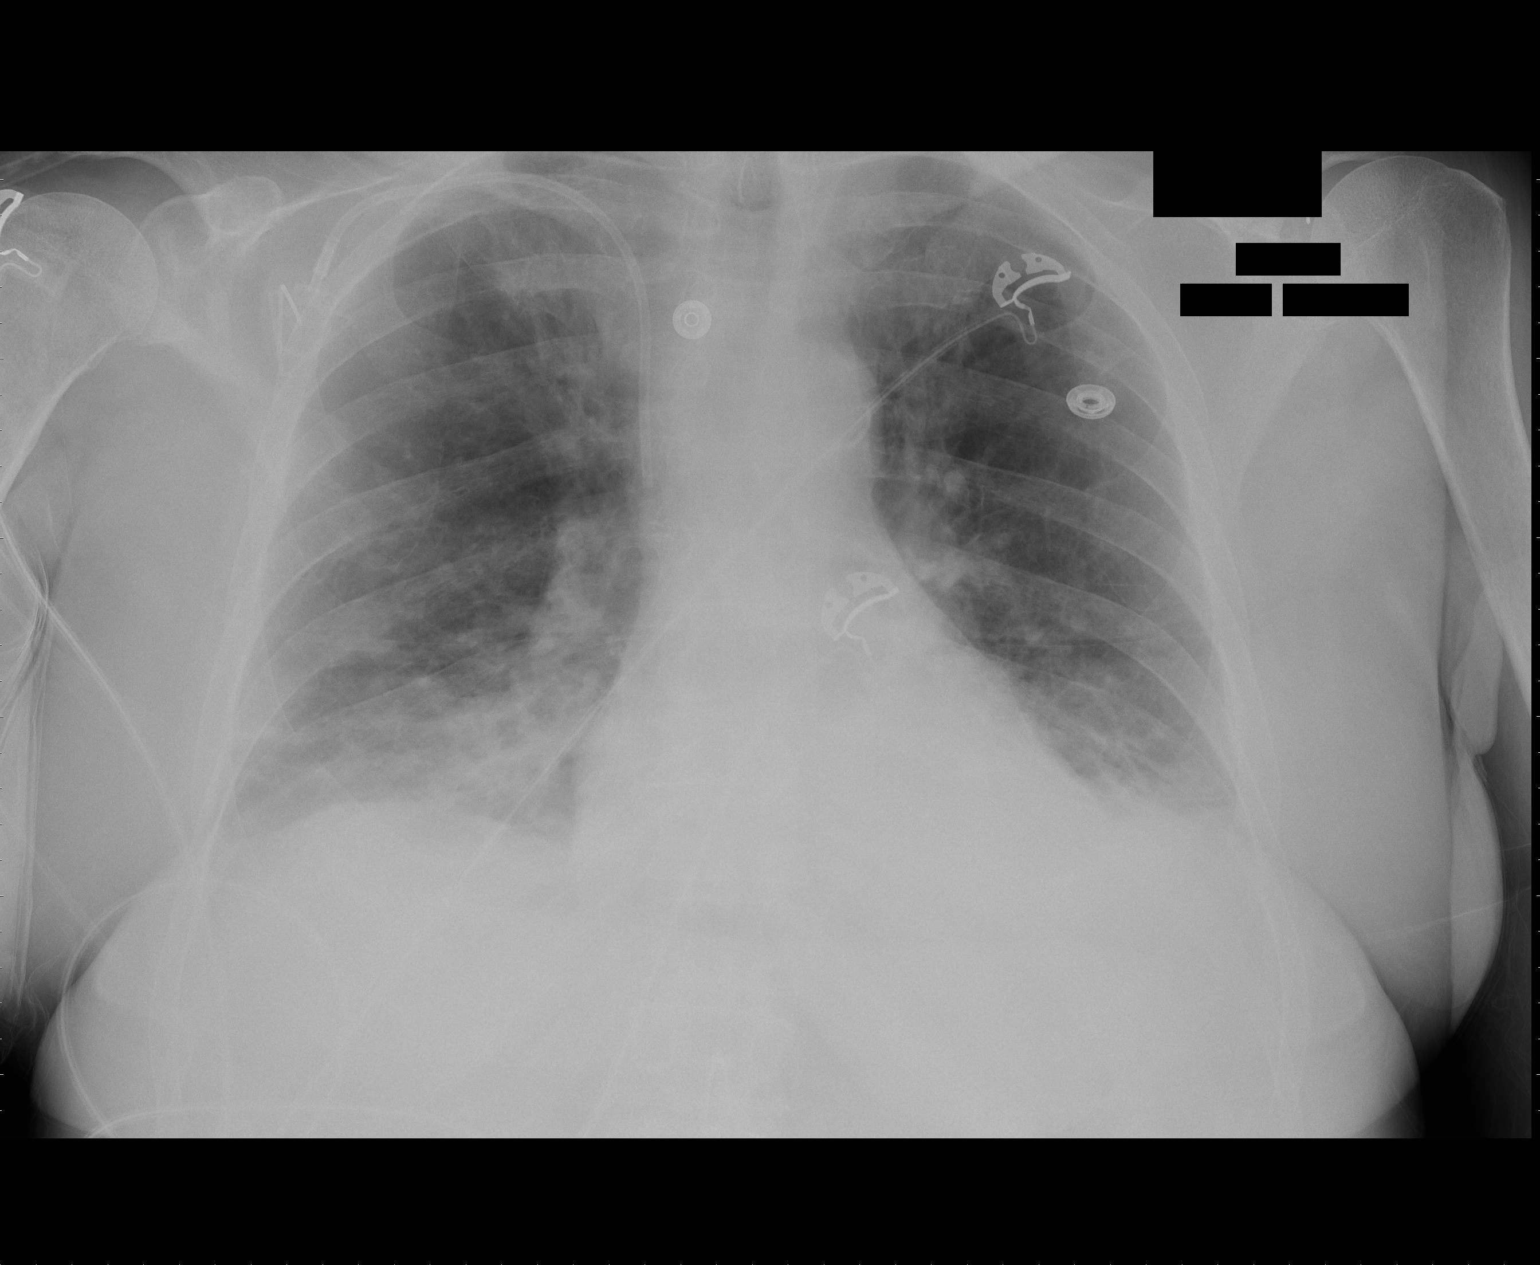

[1 of 1 positions shown; findings below may reference images not displayed]

FINDINGS: Bilateral lower lobe airspace opacities are noted,
slightly increased since prior study.  Heart is normal size.  Right
Port-A-Cath is unchanged.
IMPRESSION: Increasing bilateral lower lobe airspace opacities.

## 2011-09-18 IMAGING — CR DG CHEST 1V PORT
1 series · 1 of 1 positions shown · non-contrast
Comparison: 01/25/2010

CLINICAL DATA: Pneumonia.

PORTABLE CHEST - 1 VIEW

[AP]
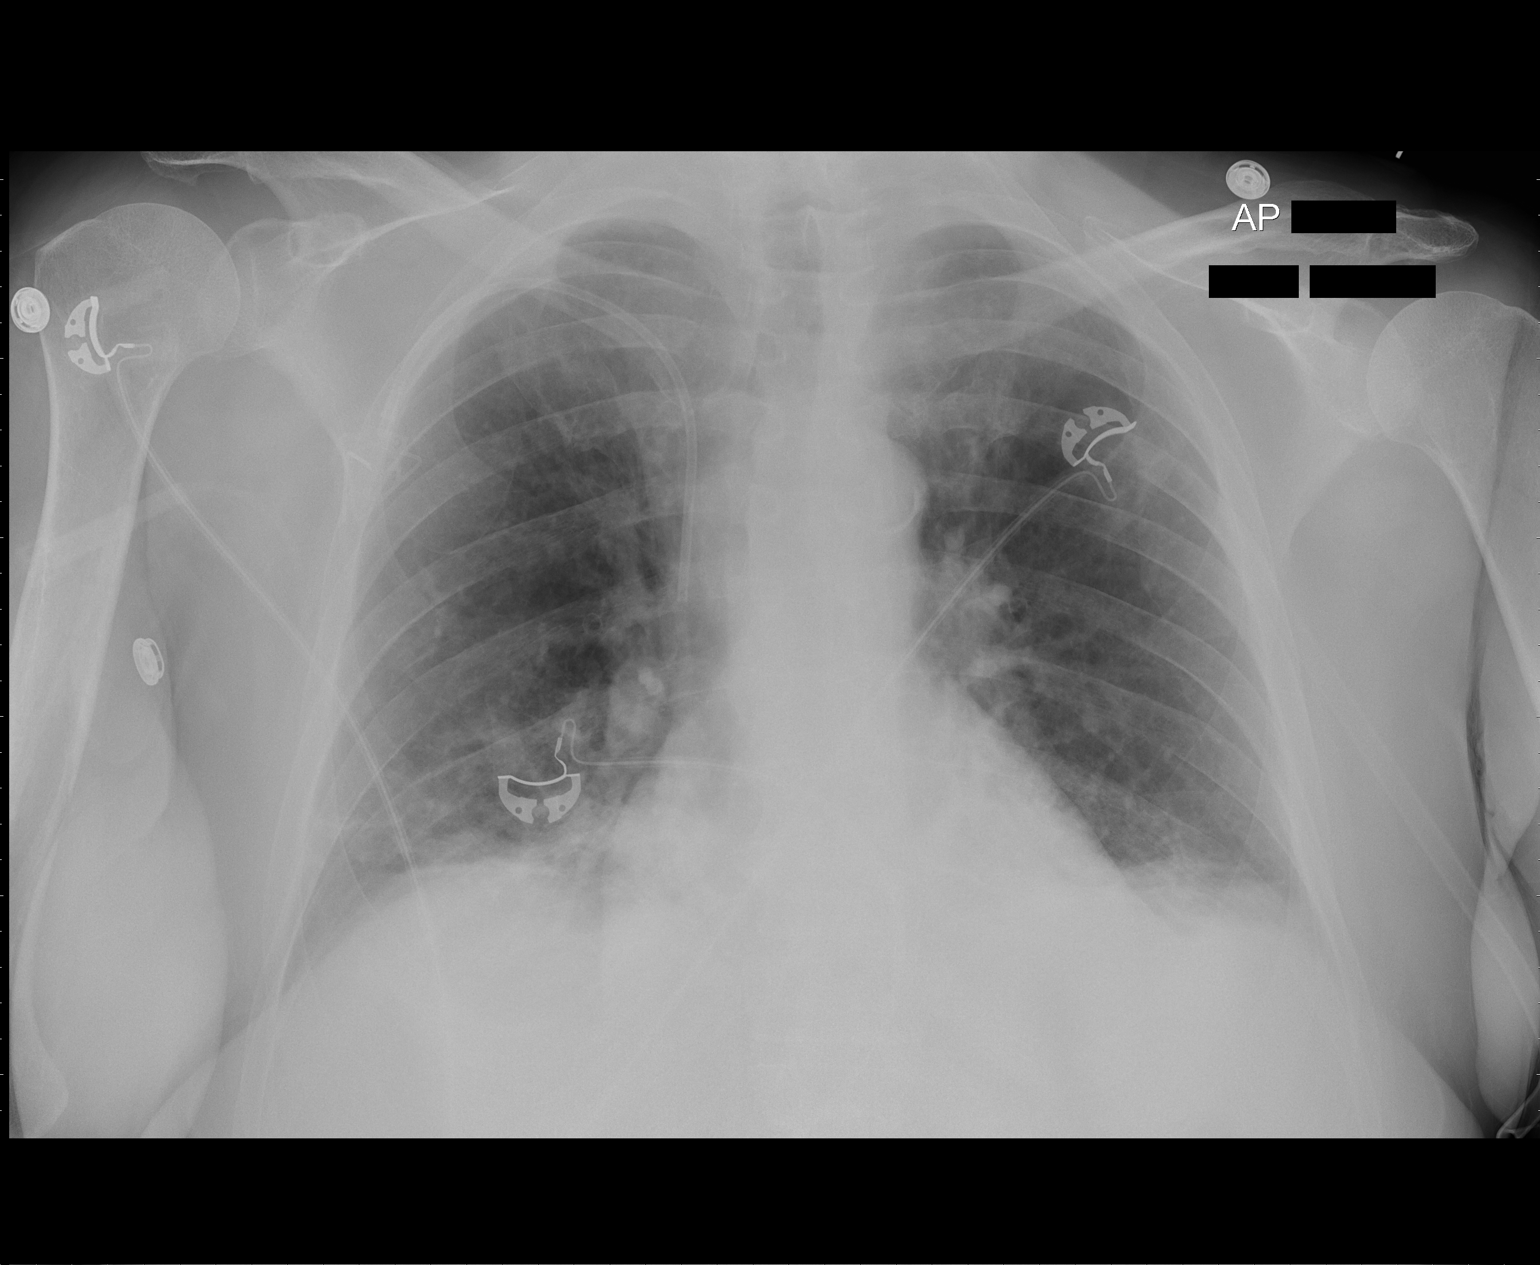

[1 of 1 positions shown; findings below may reference images not displayed]

FINDINGS: Improving bibasilar infiltrates since prior study.  Heart
is borderline in size.  No definite effusions.  Right Port-A-Cath
is unchanged.
IMPRESSION: Improving bibasilar opacities.

## 2011-09-23 ENCOUNTER — Encounter (HOSPITAL_BASED_OUTPATIENT_CLINIC_OR_DEPARTMENT_OTHER): Payer: BC Managed Care – PPO

## 2011-09-23 VITALS — BP 132/86 | HR 65 | Temp 97.9°F | Resp 16 | Wt 179.4 lb

## 2011-09-23 DIAGNOSIS — Z5112 Encounter for antineoplastic immunotherapy: Secondary | ICD-10-CM

## 2011-09-23 DIAGNOSIS — C8589 Other specified types of non-Hodgkin lymphoma, extranodal and solid organ sites: Secondary | ICD-10-CM

## 2011-09-23 MED ORDER — SODIUM CHLORIDE 0.9 % IJ SOLN
INTRAMUSCULAR | Status: AC
  Filled 2011-09-23: qty 10

## 2011-09-23 MED ORDER — RITUXIMAB CHEMO INJECTION 10 MG/ML
375.0000 mg/m2 | Freq: Once | INTRAVENOUS | Status: AC
  Administered 2011-09-23: 700 mg via INTRAVENOUS
  Filled 2011-09-23: qty 70

## 2011-09-23 MED ORDER — SODIUM CHLORIDE 0.9 % IJ SOLN
10.0000 mL | INTRAMUSCULAR | Status: DC | PRN
  Administered 2011-09-23: 10 mL
  Filled 2011-09-23: qty 10

## 2011-09-23 MED ORDER — HEPARIN SOD (PORK) LOCK FLUSH 100 UNIT/ML IV SOLN
500.0000 [IU] | Freq: Once | INTRAVENOUS | Status: DC | PRN
Start: 2011-09-23 — End: 2011-09-23
  Filled 2011-09-23: qty 5

## 2011-09-23 MED ORDER — SODIUM CHLORIDE 0.9 % IV SOLN
Freq: Once | INTRAVENOUS | Status: AC
  Administered 2011-09-23: 09:00:00 via INTRAVENOUS

## 2011-09-23 NOTE — Progress Notes (Signed)
Tolerated infusion well. 

## 2011-09-27 ENCOUNTER — Ambulatory Visit: Payer: BC Managed Care – PPO | Admitting: Critical Care Medicine

## 2011-09-30 ENCOUNTER — Encounter (HOSPITAL_BASED_OUTPATIENT_CLINIC_OR_DEPARTMENT_OTHER): Payer: BC Managed Care – PPO

## 2011-09-30 VITALS — BP 121/76 | HR 76 | Temp 97.7°F | Resp 18 | Wt 177.2 lb

## 2011-09-30 DIAGNOSIS — C8589 Other specified types of non-Hodgkin lymphoma, extranodal and solid organ sites: Secondary | ICD-10-CM

## 2011-09-30 DIAGNOSIS — Z5112 Encounter for antineoplastic immunotherapy: Secondary | ICD-10-CM

## 2011-09-30 MED ORDER — SODIUM CHLORIDE 0.9 % IJ SOLN
10.0000 mL | INTRAMUSCULAR | Status: DC | PRN
  Administered 2011-09-30: 10 mL
  Filled 2011-09-30: qty 10

## 2011-09-30 MED ORDER — SODIUM CHLORIDE 0.9 % IJ SOLN
INTRAMUSCULAR | Status: AC
  Filled 2011-09-30: qty 10

## 2011-09-30 MED ORDER — SODIUM CHLORIDE 0.9 % IV SOLN
375.0000 mg/m2 | Freq: Once | INTRAVENOUS | Status: AC
  Administered 2011-09-30: 700 mg via INTRAVENOUS
  Filled 2011-09-30: qty 70

## 2011-09-30 MED ORDER — SODIUM CHLORIDE 0.9 % IV SOLN
Freq: Once | INTRAVENOUS | Status: AC
  Administered 2011-09-30: 09:00:00 via INTRAVENOUS

## 2011-09-30 NOTE — Progress Notes (Signed)
Tolerated infusion well. 

## 2011-10-10 IMAGING — CT NM PET TUM IMG RESTAG (PS) SKULL BASE T - THIGH
6 series · 25 of 25 positions shown · IV contrast ([ID])
Comparison: PET CT 10/15/2009.

CLINICAL DATA: Subsequent treatment strategy for lymphoma.

NUCLEAR MEDICINE PET CT RESTAGING (PS) SKULL BASE TO THIGH
TECHNIQUE: 16.7 mCi F-18 FDG was injected intravenously via the
right antecubital.  Full-ring PET imaging was performed from the
skull base through the mid-thighs 60  minutes after injection.  CT
data was obtained and used for attenuation correction and anatomic
localization only.  (This was not acquired as a diagnostic CT
examination.)
Fasting Blood Glucose:  116

[Series 1: pet ac · axial · 3.3mm · 4.69mm/px · z∈[-874,-4]mm · 5 of 267 slices shown]
[im 1/267]
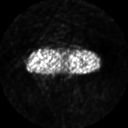
[im 67/267]
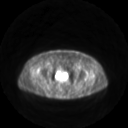
[im 134/267]
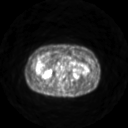
[im 200/267]
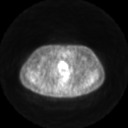
[im 267/267]
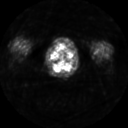

[Series 2: ct images · axial · 3.8mm · 0.98mm/px · z∈[-874,-4]mm · 6 of 267 slices shown]
[im 1/267]
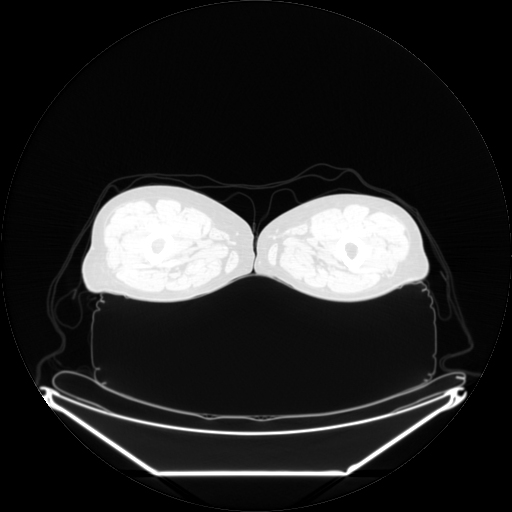
[im 54/267]
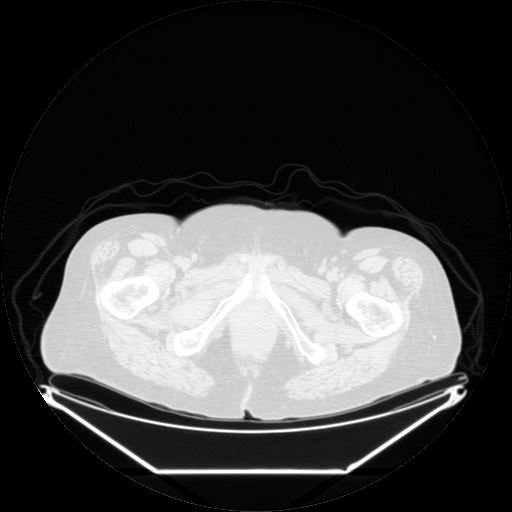
[im 107/267]
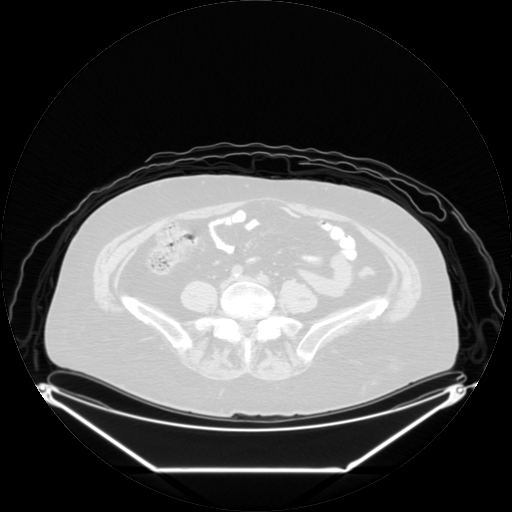
[im 160/267]
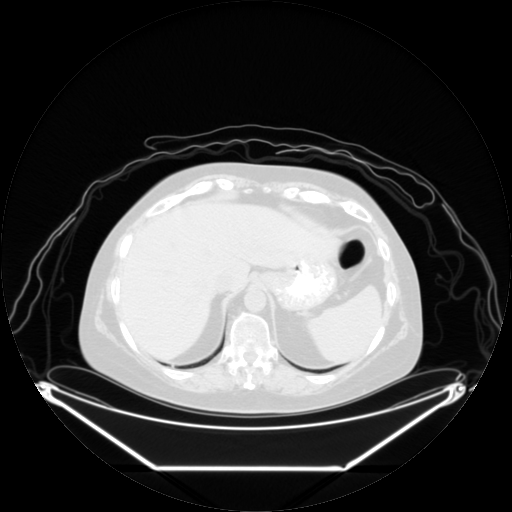
[im 213/267]
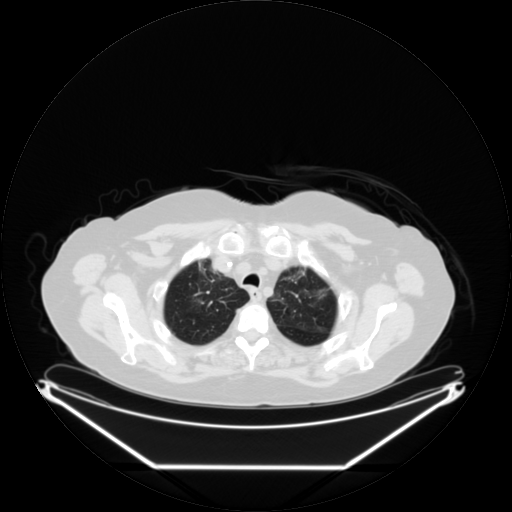
[im 267/267]
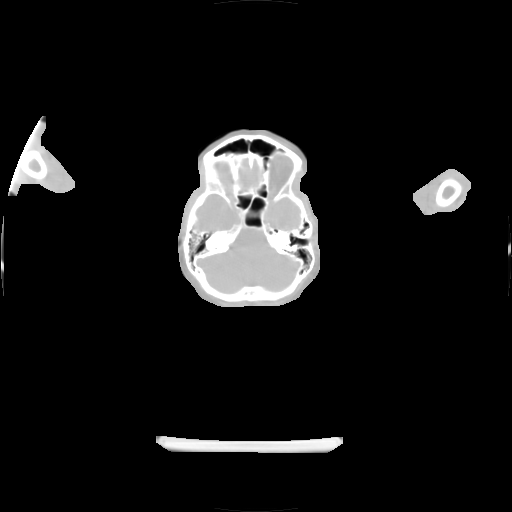

[Series 2: pet nac · axial · 3.3mm · 4.69mm/px · z∈[-874,-4]mm · 6 of 267 slices shown]
[im 1/267]
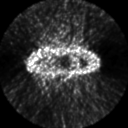
[im 54/267]
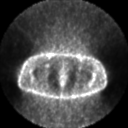
[im 107/267]
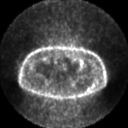
[im 160/267]
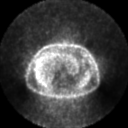
[im 213/267]
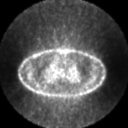
[im 267/267]
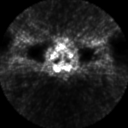

[Series 123: mip · coronal · 3.3mm · 4.69mm/px · 1 of 30 slices shown]
[im 1/30]
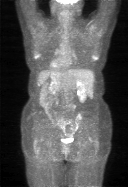

[Series 151: reformatted · axial · 3.3mm · 3.91mm/px · z∈[-874,-4]mm · 6 of 267 slices shown (1 of 2)]
[im 1/267]
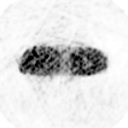
[im 54/267]
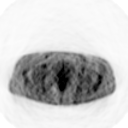
[im 107/267]
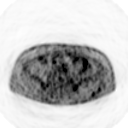
[im 160/267]
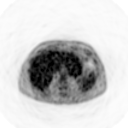
[im 213/267]
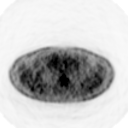
[im 267/267]
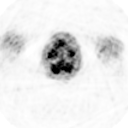

[Series 153: reformatted · coronal · 4.7mm · 6.98mm/px · 1 of 70 slices shown (2 of 2)]
[im 1/70]
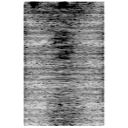

[25 of 25 positions shown; findings below may reference images not displayed]

FINDINGS: Stable matted soft tissue density near the left renal
hilum consistent with treated lymphoma.  No CT or PET findings for
recurrent metabolically active disease.

A right sided Port-A-Cath is stable.  No abnormal areas of FDG
uptake or adenopathy in the neck, chest, abdomen or pelvis.  No
worrisome lung lesions.
IMPRESSION: Negative PET CT for recurrent lymphoma

## 2011-10-11 ENCOUNTER — Other Ambulatory Visit (HOSPITAL_COMMUNITY): Payer: Self-pay | Admitting: Oncology

## 2011-10-11 ENCOUNTER — Encounter: Payer: Self-pay | Admitting: Critical Care Medicine

## 2011-10-11 ENCOUNTER — Ambulatory Visit (INDEPENDENT_AMBULATORY_CARE_PROVIDER_SITE_OTHER): Payer: BC Managed Care – PPO | Admitting: Critical Care Medicine

## 2011-10-11 VITALS — BP 106/72 | HR 62 | Temp 98.4°F | Ht 62.0 in | Wt 178.0 lb

## 2011-10-11 DIAGNOSIS — J45909 Unspecified asthma, uncomplicated: Secondary | ICD-10-CM

## 2011-10-11 DIAGNOSIS — J329 Chronic sinusitis, unspecified: Secondary | ICD-10-CM

## 2011-10-11 DIAGNOSIS — Z23 Encounter for immunization: Secondary | ICD-10-CM

## 2011-10-11 DIAGNOSIS — C8589 Other specified types of non-Hodgkin lymphoma, extranodal and solid organ sites: Secondary | ICD-10-CM

## 2011-10-11 NOTE — Progress Notes (Signed)
Subjective:    Patient ID: [name], female    DOB: [birth year], 56 y.o.   MRN: [id number]  HPI  56 y.o.WF  Asthma , hypogammaglobulinemia, chronic sinusits d/t pseudomonas   PUL ASTHMA HISTORY 10/11/2011 08/12/2011 06/17/2011 05/11/2011 03/16/2011  Symptoms >2 days/week 0-2 days/week 0-2 days/week Throughout the day 0-2 days/week  Nighttime awakenings 0-2/month 0-2/month 3-4/month Often--7/wk 0-2/month  Interference with activity Minor limitations Minor limitations Minor limitations Some limitations No limitations  SABA use > 2 days/wk--not > 1 x/day 0-2 days/wk 0-2 days/wk Several times/day 0-2 days/wk  Exacerbations requiring oral steroids 0-1 / year 0-1 / year 0-1 / year 2 or more / year 0-1 / year   06/17/2011 Feels much better, still mild foul odor.  No real chest pain.  Only sl heeze, still with cough spells not as bad.  Not as dyspneic.  Pt received total 14 days of IV Cefepime. Pt denies any significant sore throat, nasal congestion or excess secretions, fever, chills, sweats, unintended weight loss, pleurtic or exertional chest pain, orthopnea PND, or leg swelling Pt denies any increase in rescue therapy over baseline, denies waking up needing it or having any early am or nocturnal exacerbations of coughing/wheezing/or dyspnea. Pt also denies any obvious fluctuation in symptoms with  weather or environmental change or other alleviating or aggravating factors Pt is still on coumadin.  Pt notes weight up 10# in one week >>Fortaz x 7 days via PICC   07/05/2011 Follow up  2 week follow up sinusitis - reports breathing and coughing are unchanged since last visit with SOB, coughing, chest tightness.  had MRI head for recurrent headache. Showed persistent sinusitis on right. Over last 2 months she had received 3  courses of IV abx via PICC (2 -7d and 1 -14 d regimens)  She does feel better w/ clear mucus but not resolved with persistent sinus tenderness, pressure and teeth pain . Mucus is  clear. Cough on/off.  No wheezing  Has chronic sinusitis d/t psuedomonas. Had sinus surgery in past x 2 . Followed by ENT.  Still has PICC line. No redness or fever.  No chest pain or hemoptysis.   08/12/2011 Doing better.  No real cough .  Picc line has been in place since 5/13.   No mucus.  Dyspnea is better.  No real wheeze.  Occ cough paroxysms. See asthma control assessment above. Pt denies any significant sore throat, nasal congestion or excess secretions, fever, chills, sweats, unintended weight loss, pleurtic or exertional chest pain, orthopnea PND, or leg swelling Pt denies any increase in rescue therapy over baseline, denies waking up needing it or having any early am or nocturnal exacerbations of coughing/wheezing/or dyspnea. Pt also denies any obvious fluctuation in symptoms with  weather or environmental change or other alleviating or aggravating factors   10/11/2011 Notes some dyspnea with exertion and occ cough of white/yellow mucus. No sinus pressure.  No real heartburn, occ wheeze.   Pt denies any significant sore throat, nasal congestion or excess secretions, fever, chills, sweats, unintended weight loss, pleurtic or exertional chest pain, orthopnea PND, or leg swelling Pt denies any increase in rescue therapy over baseline, denies waking up needing it or having any early am or nocturnal exacerbations of coughing/wheezing/or dyspnea. Pt also denies any obvious fluctuation in symptoms with  weather or environmental change or other alleviating or aggravating factors  Pt took last round of Chemorx 09/30/11    Past Medical History  Diagnosis Date  . Non Hodgkin's  lymphoma   . Allergic rhinitis   . GERD (gastroesophageal reflux disease)   . Asthma   . Chronic sinusitis   . Respiratory failure   . Cavitary lung disease      Family History  Problem Relation Age of Onset  . Emphysema Mother   . Allergies Father   . Asthma Father     as a child  . Leukemia Maternal  Grandmother   . Diabetes Brother   . Stroke Mother      History   Social History  . Marital Status: Divorced    Spouse Name: N/A    Number of Children: 3  . Years of Education: N/A   Occupational History  . [employer] Service Manager    Social History Main Topics  . Smoking status: Never Smoker   . Smokeless tobacco: Never Used  . Alcohol Use: No  . Drug Use: Not on file  . Sexually Active: Not on file   Other Topics Concern  . Not on file   Social History Narrative  . No narrative on file     Allergies  Allergen Reactions  . Meperidine Hcl Anaphylaxis  . Montelukast Sodium Hives and Rash     Outpatient Prescriptions Prior to Visit  Medication Sig Dispense Refill  . ACCU-CHEK AVIVA PLUS test strip 1 each by Other route 2 (two) times a week.       [name] Kitchen acyclovir (ZOVIRAX) 400 MG tablet Take 1 tablet twice daily  60 tablet  6  . albuterol (PROVENTIL) (2.5 MG/3ML) 0.083% nebulizer solution Take 2.5 mg by nebulization every 4 (four) hours as needed.  120 mL  6  . albuterol (VENTOLIN HFA) 108 (90 BASE) MCG/ACT inhaler Inhale 2 puffs into the lungs every 6 (six) hours as needed.  1 Inhaler  5  . benzonatate (TESSALON) 200 MG capsule Take 1 capsule by mouth Three times daily as needed.      . citalopram (CELEXA) 20 MG tablet Take 20 mg by mouth every morning.        . furosemide (LASIX) 20 MG tablet Take 1 tablet by mouth every other day as needed. *Usually takes as needed for fluid retention upon weighing in**      . Hydrocod Polst-Chlorphen Polst (TUSSICAPS) 10-8 MG CP12 Take 1 capsule by mouth 2 (two) times daily as needed.  20 each  0  . Lancets (ACCU-CHEK MULTICLIX) lancets 1 each by Other route 2 (two) times a week.       . Mometasone Furo-Formoterol Fum 200-5 MCG/ACT AERO Inhale 2 Inhalers into the lungs 2 (two) times daily. BRAND NAME: DULERA      . omeprazole (PRILOSEC) 20 MG capsule Take 1 capsule (20 mg total) by mouth 2 (two) times daily.  60 capsule  6  . potassium  chloride 40 MEQ/15ML (20%) LIQD 2 tablespoons twice daily       . predniSONE (DELTASONE) 10 MG tablet Take 5 mg by mouth every morning.       . simvastatin (ZOCOR) 80 MG tablet Take 80 mg by mouth at bedtime.        [name] Kitchen warfarin (COUMADIN) 1 MG tablet Take 1 mg by mouth as directed.       . warfarin (COUMADIN) 10 MG tablet Take 5 mg by mouth as directed.       . zafirlukast (ACCOLATE) 20 MG tablet Take 1 tablet (20 mg total) by mouth 2 (two) times daily.  60 tablet  5   Facility-Administered  Medications Prior to Visit  Medication Dose Route Frequency Provider Last Rate Last Dose  . 0.9 %  sodium chloride infusion   Intravenous Continuous Randall An, MD 20 mL/hr at 05/12/11 0914    . Immune Globulin 5% (OCTAGAM) SOLN 10 g  10 g Intravenous Once Ellouise Newer, [place]      . Immune Globulin 5% (OCTAGAM) SOLN 70 g  70 g Intravenous Once Ellouise Newer, [place]         Review of Systems  Constitutional:   No  weight loss, night sweats,  Fevers, chills, fatigue, lassitude. HEENT:   Notes  headaches,  Difficulty swallowing,  Tooth/dental problems,  Sore throat,                No sneezing, itching, ear ache, nasal congestion, +++ post nasal drip,   CV:  No chest pain,  Orthopnea, PND, swelling in lower extremities, anasarca, dizziness, palpitations  GI  No heartburn, indigestion, abdominal pain, nausea, vomiting, diarrhea, change in bowel habits, loss of appetite  Resp: Notes  shortness of breath with exertion and  at rest.  Notes  excess mucus, notes  productive cough,  No non-productive cough,  No coughing up of blood.  Notes  change in color of mucus.  No wheezing.  No chest wall deformity  Skin: no rash or lesions.  GU: no dysuria, change in color of urine, no urgency or frequency.  No flank pain.  MS:  No joint pain or swelling.  No decreased range of motion.  No back pain.  Psych:  No change in mood or affect. No depression or anxiety.  No memory loss.     Objective:   Physical  Exam  Filed Vitals:   10/11/11 1057  BP: 106/72  Pulse: 62  Temp: 98.4 F (36.9 C)  TempSrc: Oral  Height: 5\' 2"  (1.575 m)  Weight: 178 lb (80.74 kg)  SpO2: 96%    Gen: Pleasant, well-nourished, in no distress,  normal affect  ENT: No lesions,  mouth clear,  oropharynx clear,  Mild nasal redness , R nasal clear mucus   Neck: No JVD, no TMG, no carotid bruits  Lungs: No use of accessory muscles, no dullness to percussion, coarse BS w/ no wheezing   Cardiovascular: RRR, heart sounds normal, no murmur or gallops, no peripheral edema  Abdomen: soft and NT, no HSM,  BS normal  Musculoskeletal: No deformities, no cyanosis or clubbing  Neuro: alert, non focal  Skin: Warm, no lesions or rashes      Assessment & Plan:   Extrinsic asthma, unspecified Severe persistent asthma driven by chronic sinusitis now stable with treatment of recent acute sinus flare Plan Maintain inhaled medications as prescribed No additional antibiotic therapy indicated Note PICC line was removed last office visit  SINUSITIS, CHRONIC NOS Chronic recurrent sinusitis stable at this time Plan No further antibiotics indicated Continue nasal hygiene    Updated Medication List Outpatient Encounter Prescriptions as of 10/11/2011  Medication Sig Dispense Refill  . ACCU-CHEK AVIVA PLUS test strip 1 each by Other route 2 (two) times a week.       [name] Kitchen acyclovir (ZOVIRAX) 400 MG tablet Take 1 tablet twice daily  60 tablet  6  . albuterol (PROVENTIL) (2.5 MG/3ML) 0.083% nebulizer solution Take 2.5 mg by nebulization every 4 (four) hours as needed.  120 mL  6  . albuterol (VENTOLIN HFA) 108 (90 BASE) MCG/ACT inhaler Inhale 2 puffs into the lungs every 6 (six)  hours as needed.  1 Inhaler  5  . benzonatate (TESSALON) 200 MG capsule Take 1 capsule by mouth Three times daily as needed.      . citalopram (CELEXA) 20 MG tablet Take 20 mg by mouth every morning.        . furosemide (LASIX) 20 MG tablet Take 1 tablet  by mouth every other day as needed. *Usually takes as needed for fluid retention upon weighing in**      . Hydrocod Polst-Chlorphen Polst (TUSSICAPS) 10-8 MG CP12 Take 1 capsule by mouth 2 (two) times daily as needed.  20 each  0  . Lancets (ACCU-CHEK MULTICLIX) lancets 1 each by Other route 2 (two) times a week.       . Mometasone Furo-Formoterol Fum 200-5 MCG/ACT AERO Inhale 2 Inhalers into the lungs 2 (two) times daily. BRAND NAME: DULERA      . omeprazole (PRILOSEC) 20 MG capsule Take 1 capsule (20 mg total) by mouth 2 (two) times daily.  60 capsule  6  . potassium chloride 40 MEQ/15ML (20%) LIQD 2 tablespoons twice daily       . predniSONE (DELTASONE) 10 MG tablet Take 5 mg by mouth every morning.       . simvastatin (ZOCOR) 80 MG tablet Take 80 mg by mouth at bedtime.        [name] Kitchen warfarin (COUMADIN) 1 MG tablet Take 1 mg by mouth as directed.       . warfarin (COUMADIN) 10 MG tablet Take 5 mg by mouth as directed.       . zafirlukast (ACCOLATE) 20 MG tablet Take 1 tablet (20 mg total) by mouth 2 (two) times daily.  60 tablet  5   Facility-Administered Encounter Medications as of 10/11/2011  Medication Dose Route Frequency Provider Last Rate Last Dose  . 0.9 %  sodium chloride infusion   Intravenous Continuous Randall An, MD 20 mL/hr at 05/12/11 0914    . Immune Globulin 5% (OCTAGAM) SOLN 10 g  10 g Intravenous Once Ellouise Newer, [place]      . Immune Globulin 5% (OCTAGAM) SOLN 70 g  70 g Intravenous Once Ellouise Newer, [place]

## 2011-10-11 NOTE — Assessment & Plan Note (Signed)
Severe persistent asthma driven by chronic sinusitis now stable with treatment of recent acute sinus flare Plan Maintain inhaled medications as prescribed No additional antibiotic therapy indicated Note PICC line was removed last office visit

## 2011-10-11 NOTE — Patient Instructions (Signed)
Flu vaccine and pneumovax today No change in medications. Return in          3 months

## 2011-10-11 NOTE — Assessment & Plan Note (Signed)
Chronic recurrent sinusitis stable at this time Plan No further antibiotics indicated Continue nasal hygiene

## 2011-10-28 ENCOUNTER — Other Ambulatory Visit (HOSPITAL_COMMUNITY): Payer: Self-pay | Admitting: Nephrology

## 2011-10-28 DIAGNOSIS — N183 Chronic kidney disease, stage 3 unspecified: Secondary | ICD-10-CM

## 2011-11-02 ENCOUNTER — Telehealth (HOSPITAL_COMMUNITY): Payer: Self-pay | Admitting: *Deleted

## 2011-11-02 ENCOUNTER — Telehealth: Payer: Self-pay | Admitting: Critical Care Medicine

## 2011-11-02 ENCOUNTER — Ambulatory Visit (HOSPITAL_COMMUNITY): Payer: BC Managed Care – PPO

## 2011-11-02 NOTE — Telephone Encounter (Signed)
LMOM TCB x1.  PW with openings in [place] if pt is amenable, or can make ov in [place] office.

## 2011-11-02 NOTE — Telephone Encounter (Signed)
Pt. Called to report fever last night of 101. She has headache/sinus drainage.

## 2011-11-02 NOTE — Telephone Encounter (Signed)
Destiney notified that we would like to do IVIG , She states she can not do Thurs and Friday. I scheduled her for next Tues and Wednesday. Please send antibiotic to [facility]

## 2011-11-02 NOTE — Telephone Encounter (Signed)
Pt is ill again.  She needs OV ASAP

## 2011-11-02 NOTE — Telephone Encounter (Signed)
Message copied by Storm Frisk on Wed Nov 02, 2011  3:16 PM ------      Message from: Mariel Sleet, ERIC S      Created: Wed Nov 02, 2011  1:16 PM       Make sure she lets Dr Delford Field know asap      We can schedule ivig 80 gms day 1 and 2 with antibiotics       I will send this to him(Pat she feels she is infected again in sinuses with fever and mucus, etc)      Minerva Areola

## 2011-11-02 NOTE — Telephone Encounter (Signed)
Pt made apt to see TP on Monday at 4:15.

## 2011-11-03 ENCOUNTER — Other Ambulatory Visit (HOSPITAL_COMMUNITY): Payer: Self-pay | Admitting: *Deleted

## 2011-11-04 ENCOUNTER — Ambulatory Visit (HOSPITAL_COMMUNITY)
Admission: RE | Admit: 2011-11-04 | Discharge: 2011-11-04 | Disposition: A | Discharge status: Home / Self Care | Payer: BC Managed Care – PPO | Source: Ambulatory Visit | Attending: Nephrology | Admitting: Nephrology

## 2011-11-04 DIAGNOSIS — N183 Chronic kidney disease, stage 3 unspecified: Secondary | ICD-10-CM

## 2011-11-04 DIAGNOSIS — C8589 Other specified types of non-Hodgkin lymphoma, extranodal and solid organ sites: Secondary | ICD-10-CM | POA: Insufficient documentation

## 2011-11-07 ENCOUNTER — Encounter: Payer: Self-pay | Admitting: Adult Health

## 2011-11-07 ENCOUNTER — Ambulatory Visit (INDEPENDENT_AMBULATORY_CARE_PROVIDER_SITE_OTHER): Payer: BC Managed Care – PPO | Admitting: Adult Health

## 2011-11-07 VITALS — BP 104/64 | HR 58 | Temp 97.5°F | Ht 62.0 in | Wt 178.0 lb

## 2011-11-07 DIAGNOSIS — J45909 Unspecified asthma, uncomplicated: Secondary | ICD-10-CM

## 2011-11-07 MED ORDER — CIPROFLOXACIN HCL 750 MG PO TABS: 750.0000 mg | ORAL_TABLET | Freq: Two times a day (BID) | ORAL | Status: AC

## 2011-11-07 NOTE — Progress Notes (Signed)
56 y.o.WF  Asthma , hypogammaglobulinemia, chronic sinusits d/t pseudomonas Hx of Diffuse large B-Cell Lymphoma, S/P R-CHOP x 4 cycles with intrathecal chemotherapy during cycles 3 and 4 with methotrexate and Solu-Cortef.  Treatment was stopped due to Pseudomonas sepsis after cycles 2 and 4 which was very severe.     Rituxan alone  Every month, last dose 09/30/11. Now on Hold. PET due in 01/2012   11/07/2011 Acute OV  Complains of prod cough with yellow mucus, head congestion with yellow drainage, some PND, increased SOB, some wheezing, tightness w/ coughing, fever x10days - scheduled for IVIG 11/5-6  Coumadin checks ok, INR last 2.5, - 2 weeks ago.  Currently on  Prednisone 5mg  daily  No hemoptysis . Ran fever initially -low grade . No fever for 3 days .  No edema.    ROS:  Constitutional:   No  weight loss, night sweats,  Fevers, chills, + fatigue, or  lassitude.  HEENT:   No headaches,  Difficulty swallowing,  Tooth/dental problems, or  Sore throat,                No sneezing, itching, ear ache, + nasal congestion, post nasal drip,   CV:  No chest pain,  Orthopnea, PND, swelling in lower extremities, anasarca, dizziness, palpitations, syncope.   GI  No heartburn, indigestion, abdominal pain, nausea, vomiting, diarrhea, change in bowel habits, loss of appetite, bloody stools.   Resp:   No coughing up of blood.   No chest wall deformity  Skin: no rash or lesions.  GU: no dysuria, change in color of urine, no urgency or frequency.  No flank pain, no hematuria   MS:  No joint pain or swelling.  No decreased range of motion.  No back pain.  Psych:  No change in mood or affect. No depression or anxiety.  No memory loss.       EXAM :  GEN: A/Ox3; pleasant , NAD  HEENT:  [place]/AT,  EACs-clear, TMs-wnl, NOSE-clear drainage , THROAT-clear, no lesions, no postnasal drip or exudate noted.   NECK:  Supple w/ fair ROM; no JVD; normal carotid impulses w/o bruits; no thyromegaly or nodules  palpated; no lymphadenopathy.  RESP  Few scattered rhonchi no accessory muscle use, no dullness to percussion  CARD:  RRR, no m/r/g  , no peripheral edema, pulses intact, no cyanosis or clubbing.  GI:   Soft & nt; nml bowel sounds; no organomegaly or masses detected.  Musco: Warm bil, no deformities or joint swelling noted.   Neuro: alert, no focal deficits noted.    Skin: Warm, no lesions or rashes  '

## 2011-11-07 NOTE — Patient Instructions (Addendum)
Cipro 750mg  Twice daily  For 10 days -take with food.  Mucinex DM Twice daily  As needed  Cough/congestion  Fluids and rest  Saline nasal rinses As needed   follow up Dr. Delford Field  In 2-3 weeks and As needed   Contact coumadin clinic for recheck on Antibitoic

## 2011-11-08 ENCOUNTER — Encounter: Payer: Self-pay | Admitting: Adult Health

## 2011-11-08 ENCOUNTER — Encounter (HOSPITAL_COMMUNITY): Payer: BC Managed Care – PPO | Attending: Oncology

## 2011-11-08 VITALS — BP 128/77 | HR 70 | Temp 98.9°F | Resp 18 | Wt 179.0 lb

## 2011-11-08 DIAGNOSIS — D801 Nonfamilial hypogammaglobulinemia: Secondary | ICD-10-CM

## 2011-11-08 DIAGNOSIS — C8589 Other specified types of non-Hodgkin lymphoma, extranodal and solid organ sites: Secondary | ICD-10-CM | POA: Insufficient documentation

## 2011-11-08 LAB — CBC WITH DIFFERENTIAL/PLATELET
Basophils Relative: 0 % (ref 0–1)
HCT: 35.7 % — ABNORMAL LOW (ref 36.0–46.0)
Hemoglobin: 12 g/dL (ref 12.0–15.0)
Lymphocytes Relative: 9 % — ABNORMAL LOW (ref 12–46)
MCHC: 33.6 g/dL (ref 30.0–36.0)
Monocytes Absolute: 0.6 10*3/uL (ref 0.1–1.0)
Monocytes Relative: 9 % (ref 3–12)
Neutro Abs: 5.7 10*3/uL (ref 1.7–7.7)
Neutrophils Relative %: 79 % — ABNORMAL HIGH (ref 43–77)
RBC: 3.84 MIL/uL — ABNORMAL LOW (ref 3.87–5.11)
WBC: 7.2 10*3/uL (ref 4.0–10.5)

## 2011-11-08 LAB — COMPREHENSIVE METABOLIC PANEL
AST: 16 U/L (ref 0–37)
Albumin: 3.6 g/dL (ref 3.5–5.2)
Alkaline Phosphatase: 102 U/L (ref 39–117)
BUN: 11 mg/dL (ref 6–23)
CO2: 30 mEq/L (ref 19–32)
Chloride: 101 mEq/L (ref 96–112)
Creatinine, Ser: 1.17 mg/dL — ABNORMAL HIGH (ref 0.50–1.10)
GFR calc non Af Amer: 51 mL/min — ABNORMAL LOW (ref >90)
Potassium: 3.8 mEq/L (ref 3.5–5.1)
Total Bilirubin: 0.3 mg/dL (ref 0.3–1.2)

## 2011-11-08 MED ORDER — SODIUM CHLORIDE 0.9 % IJ SOLN
INTRAMUSCULAR | Status: AC
  Filled 2011-11-08: qty 10

## 2011-11-08 MED ORDER — HEPARIN SOD (PORK) LOCK FLUSH 100 UNIT/ML IV SOLN
500.0000 [IU] | Freq: Once | INTRAVENOUS | Status: DC
  Filled 2011-11-08: qty 5

## 2011-11-08 MED ORDER — IMMUNE GLOBULIN (HUMAN) 10 GM/200ML IV SOLN
70.0000 g | Freq: Once | INTRAVENOUS | Status: AC
  Administered 2011-11-08: 70 g via INTRAVENOUS
  Filled 2011-11-08: qty 1400

## 2011-11-08 MED ORDER — IMMUNE GLOBULIN (HUMAN) 10 GM/200ML IV SOLN
10.0000 g | Freq: Once | INTRAVENOUS | Status: AC
  Administered 2011-11-08: 10 g via INTRAVENOUS
  Filled 2011-11-08: qty 200

## 2011-11-08 MED ORDER — IMMUNE GLOBULIN (HUMAN) 10 GM/200ML IV SOLN
1.0000 g/kg | Freq: Once | INTRAVENOUS | Status: DC
  Filled 2011-11-08: qty 1600

## 2011-11-08 MED ORDER — DEXTROSE 5 % IV SOLN
INTRAVENOUS | Status: DC
  Administered 2011-11-08: 14:00:00 via INTRAVENOUS

## 2011-11-08 MED ORDER — SODIUM CHLORIDE 0.9 % IJ SOLN
10.0000 mL | INTRAMUSCULAR | Status: DC | PRN
  Filled 2011-11-08: qty 10

## 2011-11-08 NOTE — Assessment & Plan Note (Signed)
Flare   Plan Cipro 750mg  Twice daily  For 10 days -take with food.  Mucinex DM Twice daily  As needed  Cough/congestion  Fluids and rest  Saline nasal rinses As needed   follow up Dr. Delford Field  In 2-3 weeks and As needed   Contact coumadin clinic for recheck on Antibitoic

## 2011-11-08 NOTE — Progress Notes (Signed)
Tolerated ivig well.

## 2011-11-09 ENCOUNTER — Encounter (HOSPITAL_BASED_OUTPATIENT_CLINIC_OR_DEPARTMENT_OTHER): Payer: BC Managed Care – PPO

## 2011-11-09 DIAGNOSIS — D839 Common variable immunodeficiency, unspecified: Secondary | ICD-10-CM

## 2011-11-09 DIAGNOSIS — J329 Chronic sinusitis, unspecified: Secondary | ICD-10-CM

## 2011-11-09 DIAGNOSIS — C8589 Other specified types of non-Hodgkin lymphoma, extranodal and solid organ sites: Secondary | ICD-10-CM

## 2011-11-09 MED ORDER — DEXTROSE 5 % IV SOLN
INTRAVENOUS | Status: DC
  Administered 2011-11-09: 09:00:00 via INTRAVENOUS

## 2011-11-09 MED ORDER — IMMUNE GLOBULIN (HUMAN) 10 GM/200ML IV SOLN: 1.0000 g/kg | Freq: Once | INTRAVENOUS | Status: DC

## 2011-11-09 MED ORDER — IMMUNE GLOBULIN (HUMAN) 10 GM/200ML IV SOLN
70.0000 g | Freq: Once | INTRAVENOUS | Status: AC
  Administered 2011-11-09: 70 g via INTRAVENOUS
  Filled 2011-11-09: qty 1400

## 2011-11-09 MED ORDER — IMMUNE GLOBULIN (HUMAN) 10 GM/200ML IV SOLN
10.0000 g | Freq: Once | INTRAVENOUS | Status: AC
  Administered 2011-11-09: 10 g via INTRAVENOUS
  Filled 2011-11-09: qty 200

## 2011-12-07 ENCOUNTER — Ambulatory Visit: Payer: BC Managed Care – PPO | Admitting: Critical Care Medicine

## 2011-12-14 ENCOUNTER — Encounter: Payer: Self-pay | Admitting: Critical Care Medicine

## 2011-12-14 ENCOUNTER — Ambulatory Visit (INDEPENDENT_AMBULATORY_CARE_PROVIDER_SITE_OTHER)
Admission: RE | Admit: 2011-12-14 | Discharge: 2011-12-14 | Disposition: A | Discharge status: Home / Self Care | Payer: BC Managed Care – PPO | Source: Ambulatory Visit | Attending: Critical Care Medicine | Admitting: Critical Care Medicine

## 2011-12-14 ENCOUNTER — Ambulatory Visit (INDEPENDENT_AMBULATORY_CARE_PROVIDER_SITE_OTHER): Payer: BC Managed Care – PPO | Admitting: Critical Care Medicine

## 2011-12-14 VITALS — BP 124/82 | HR 64 | Temp 98.7°F | Ht 62.0 in | Wt 178.6 lb

## 2011-12-14 DIAGNOSIS — J209 Acute bronchitis, unspecified: Secondary | ICD-10-CM

## 2011-12-14 DIAGNOSIS — J45902 Unspecified asthma with status asthmaticus: Secondary | ICD-10-CM

## 2011-12-14 DIAGNOSIS — J45909 Unspecified asthma, uncomplicated: Secondary | ICD-10-CM

## 2011-12-14 DIAGNOSIS — J329 Chronic sinusitis, unspecified: Secondary | ICD-10-CM

## 2011-12-14 MED ORDER — GEMIFLOXACIN MESYLATE 320 MG PO TABS: 320.0000 mg | ORAL_TABLET | Freq: Every day | ORAL | Status: DC

## 2011-12-14 MED ORDER — PREDNISONE 10 MG PO TABS: ORAL_TABLET | ORAL | Status: DC

## 2011-12-14 NOTE — Assessment & Plan Note (Signed)
Severe persistent sinusitis and asthma with flare Review sinusitis evaluation

## 2011-12-14 NOTE — Assessment & Plan Note (Signed)
Acute on chronic sinusitis with mild asthma flare Plan Factive for 7 days Prednisone 10mg  Take 4 for three days 3 for three days 2 for three days 1 for three days then 1/2 daily and stay No other medication changes

## 2011-12-14 NOTE — Progress Notes (Signed)
Quick Note:  Called, spoke with pt. Informed her of cxr results and recs per Dr. Wright. She verbalized understanding and voiced no further questions or concerns at this time. ______ 

## 2011-12-14 NOTE — Patient Instructions (Addendum)
Factive for 7 days Prednisone 10mg  Take 4 for three days 3 for three days 2 for three days 1 for three days then 1/2 daily and stay No other medication changes Chest xray today Return 1 month

## 2011-12-14 NOTE — Progress Notes (Signed)
Quick Note:  Notify the patient that the Xray is stable and no pneumonia No change in medications are recommended. Continue current meds as prescribed at last office visit ______ 

## 2011-12-14 NOTE — Progress Notes (Signed)
Subjective:    Patient ID: [name], female    DOB: [birth year], 56 y.o.   MRN: [id number]  HPI 56 y.o.WF  Asthma , hypogammaglobulinemia, chronic sinusits d/t pseudomonas Hx of Diffuse large B-Cell Lymphoma, S/P R-CHOP x 4 cycles with intrathecal chemotherapy during cycles 3 and 4 with methotrexate and Solu-Cortef.  Treatment was stopped due to Pseudomonas sepsis after cycles 2 and 4 which was very severe.     Rituxan alone  Every month, last dose 09/30/11. Now on Hold. PET due in 01/2012   11/07/2011 Acute OV  Complains of prod cough with yellow mucus, head congestion with yellow drainage, some PND, increased SOB, some wheezing, tightness w/ coughing, fever x10days - scheduled for IVIG 11/5-6  Coumadin checks ok, INR last 2.5, - 2 weeks ago.  Currently on  Prednisone 5mg  daily  No hemoptysis . Ran fever initially -low grade . No fever for 3 days .  No edema.   12/14/2011 Pt with more cough and chest tightness.  No CXR done . Rx Levaquin x 10days and this helped per pcp. On prednisone 5mg  /d  No hemoptysis.  No fever now.  No sweats or chills.  Notes sinus pressure as before.  PUL ASTHMA HISTORY 12/14/2011 10/11/2011 08/12/2011 06/17/2011 05/11/2011  Symptoms Daily >2 days/week 0-2 days/week 0-2 days/week Throughout the day  Nighttime awakenings 3-4/month 0-2/month 0-2/month 3-4/month Often--7/wk  Interference with activity Some limitations Minor limitations Minor limitations Minor limitations Some limitations  SABA use Daily > 2 days/wk--not > 1 x/day 0-2 days/wk 0-2 days/wk Several times/day  Exacerbations requiring oral steroids 2 or more / year 0-1 / year 0-1 / year 0-1 / year 2 or more / year      Review of Systems 11 point review of systems taken in detail and is negative except as noted in history of present illness    Objective:   Physical Exam Filed Vitals:   12/14/11 0853  BP: 124/82  Pulse: 64  Temp: 98.7 F (37.1 C)  TempSrc: Oral  Height: 5\' 2"  (1.575 m)  Weight:  178 lb 9.6 oz (81.012 kg)  SpO2: 97%    Gen: Pleasant, well-nourished, in no distress,  normal affect  ENT: No lesions,  mouth clear,  oropharynx clear, ++ postnasal drip, bilateral nasal purulence  Neck: No JVD, no TMG, no carotid bruits  Lungs: No use of accessory muscles, no dullness to percussion, expired wheezes with poor airflow   Cardiovascular: RRR, heart sounds normal, no murmur or gallops, no peripheral edema  Abdomen: soft and NT, no HSM,  BS normal  Musculoskeletal: No deformities, no cyanosis or clubbing  Neuro: alert, non focal  Skin: Warm, no lesions or rashes  Dg Chest 2 View  12/14/2011  *RADIOLOGY REPORT*  Clinical Data: Asthma and bronchitis  CHEST - 2 VIEW  Comparison: 08/03/2011  Findings: Heart size is normal.  No pleural effusion or edema.  No airspace consolidation identified.  Chronic interstitial coarsening appears similar to previous exam.  IMPRESSION:  1.  No acute cardiopulmonary abnormalities. 2.  Chronic interstitial coarsening.  Similar to previous exam.   Original Report Authenticated By: Signa Kell, M.D.           Assessment & Plan:   SINUSITIS, CHRONIC NOS Acute on chronic sinusitis with mild asthma flare Plan Factive for 7 days Prednisone 10mg  Take 4 for three days 3 for three days 2 for three days 1 for three days then 1/2 daily and stay No other medication changes  Severe persistent asthma with chronic sinusitis Severe persistent sinusitis and asthma with flare Review sinusitis evaluation    Updated Medication List Outpatient Encounter Prescriptions as of 12/14/2011  Medication Sig Dispense Refill  . ACCU-CHEK AVIVA PLUS test strip 1 each by Other route 2 (two) times a week.       [name] Kitchen acyclovir (ZOVIRAX) 400 MG tablet Take 1 tablet twice daily  60 tablet  6  . albuterol (PROVENTIL) (2.5 MG/3ML) 0.083% nebulizer solution Take 2.5 mg by nebulization every 4 (four) hours as needed.  120 mL  6  . albuterol (VENTOLIN HFA) 108 (90  BASE) MCG/ACT inhaler Inhale 2 puffs into the lungs every 6 (six) hours as needed.  1 Inhaler  5  . benzonatate (TESSALON) 200 MG capsule Take 1 capsule by mouth Three times daily as needed.      . citalopram (CELEXA) 20 MG tablet Take 20 mg by mouth every morning.        . furosemide (LASIX) 20 MG tablet Take 1 tablet by mouth every other day as needed. *Usually takes as needed for fluid retention upon weighing in**      . Lancets (ACCU-CHEK MULTICLIX) lancets 1 each by Other route 2 (two) times a week.       . Mometasone Furo-Formoterol Fum 200-5 MCG/ACT AERO Inhale 2 Inhalers into the lungs 2 (two) times daily. BRAND NAME: DULERA      . omeprazole (PRILOSEC) 20 MG capsule Take 1 capsule (20 mg total) by mouth 2 (two) times daily.  60 capsule  6  . potassium chloride 40 MEQ/15ML (20%) LIQD 2 tablespoons twice daily       . predniSONE (DELTASONE) 10 MG tablet Take 4 for three days 3 for three days 2 for three days 1 for three days then 1/2 daily  60 tablet  6  . simvastatin (ZOCOR) 80 MG tablet Take 80 mg by mouth at bedtime.        [name] Kitchen warfarin (COUMADIN) 1 MG tablet Take 1 mg by mouth as directed.       . warfarin (COUMADIN) 10 MG tablet Take 5 mg by mouth as directed.       . zafirlukast (ACCOLATE) 20 MG tablet Take 1 tablet (20 mg total) by mouth 2 (two) times daily.  60 tablet  5  . [DISCONTINUED] predniSONE (DELTASONE) 10 MG tablet Take 5 mg by mouth every morning.       [name] Kitchen gemifloxacin (FACTIVE) 320 MG tablet Take 1 tablet (320 mg total) by mouth daily.  7 tablet  0  . Hydrocod Polst-Chlorphen Polst (TUSSICAPS) 10-8 MG CP12 Take 1 capsule by mouth 2 (two) times daily as needed.  20 each  0   Facility-Administered Encounter Medications as of 12/14/2011  Medication Dose Route Frequency Provider Last Rate Last Dose  . 0.9 %  sodium chloride infusion   Intravenous Continuous Randall An, MD 20 mL/hr at 05/12/11 0914    . Immune Globulin 5% (OCTAGAM) SOLN 10 g  10 g Intravenous Once Ellouise Newer, [place]      . Immune Globulin 5% (OCTAGAM) SOLN 70 g  70 g Intravenous Once Ellouise Newer, [place]

## 2011-12-19 IMAGING — CR DG CHEST 2V
2 series · 2 of 2 positions shown · non-contrast
Comparison: [DATE].

CLINICAL DATA: Cough, fever, rales.  Lymphoma.  Question pneumonia.

CHEST - 2 VIEW

[view not recorded (1 of 2)]
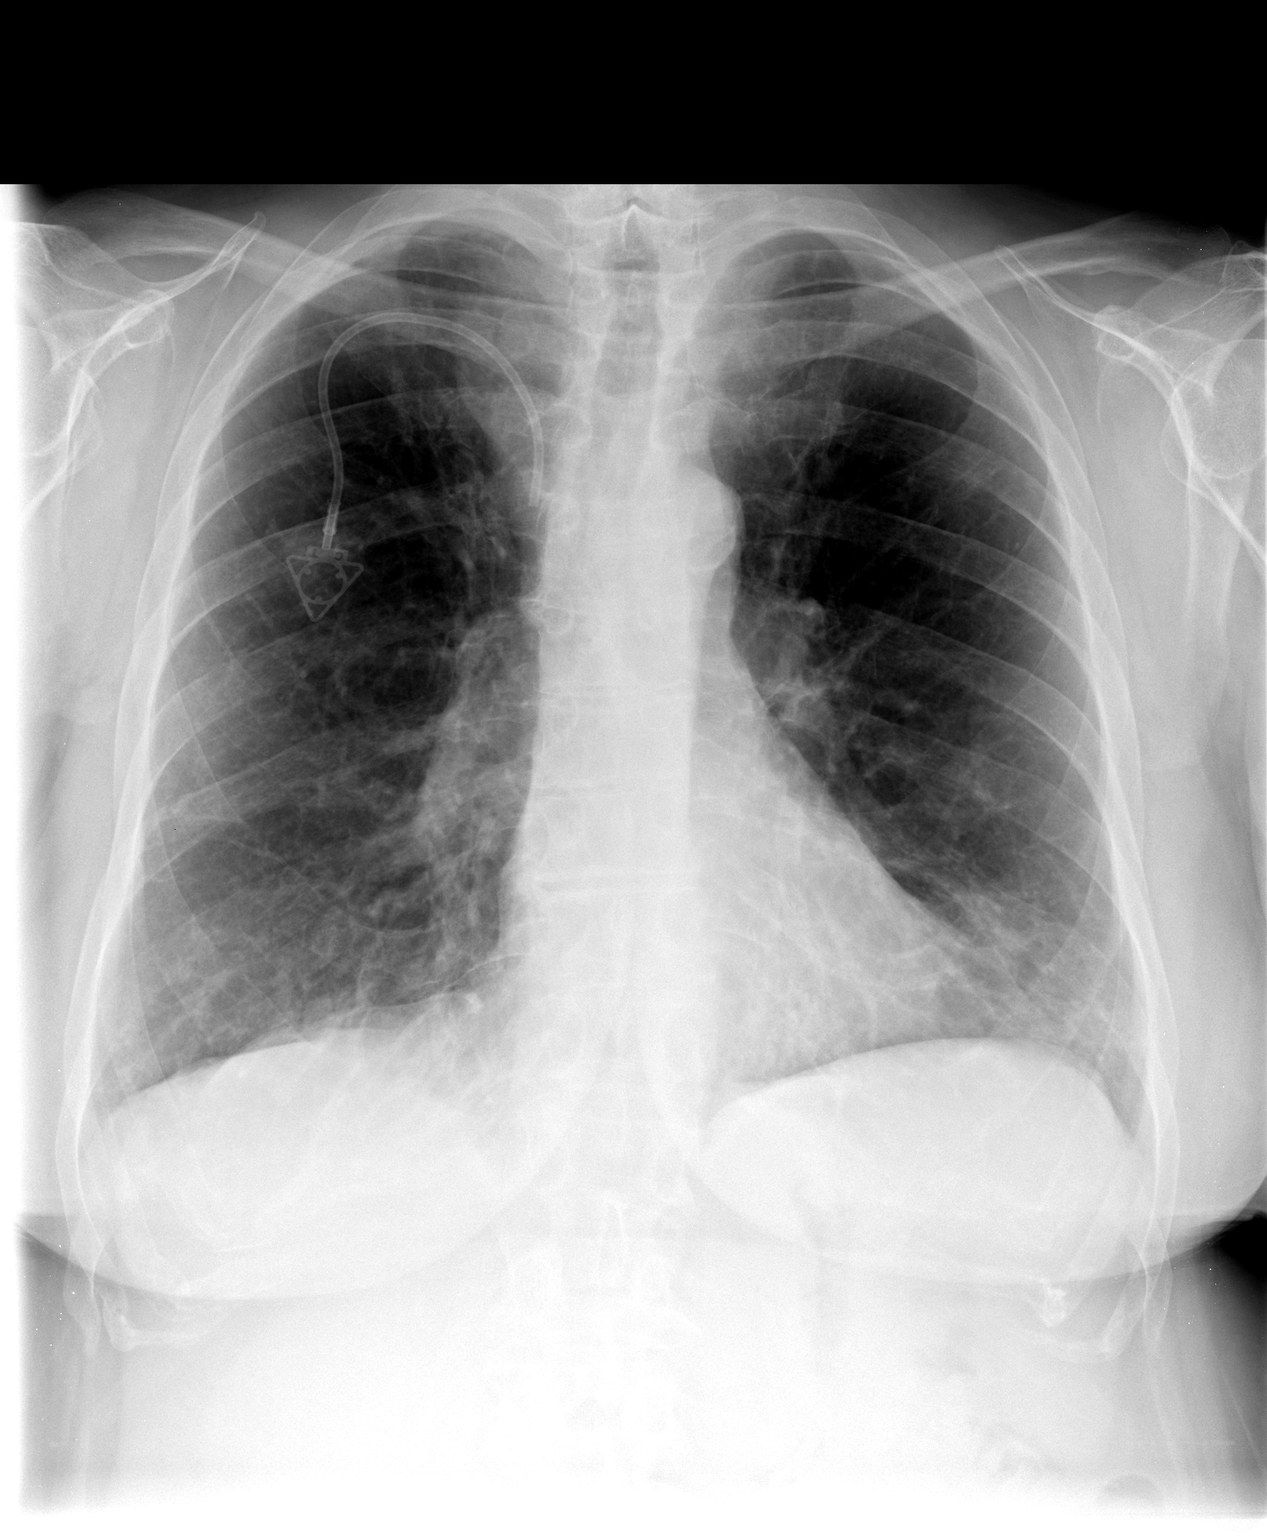

[view not recorded (2 of 2)]
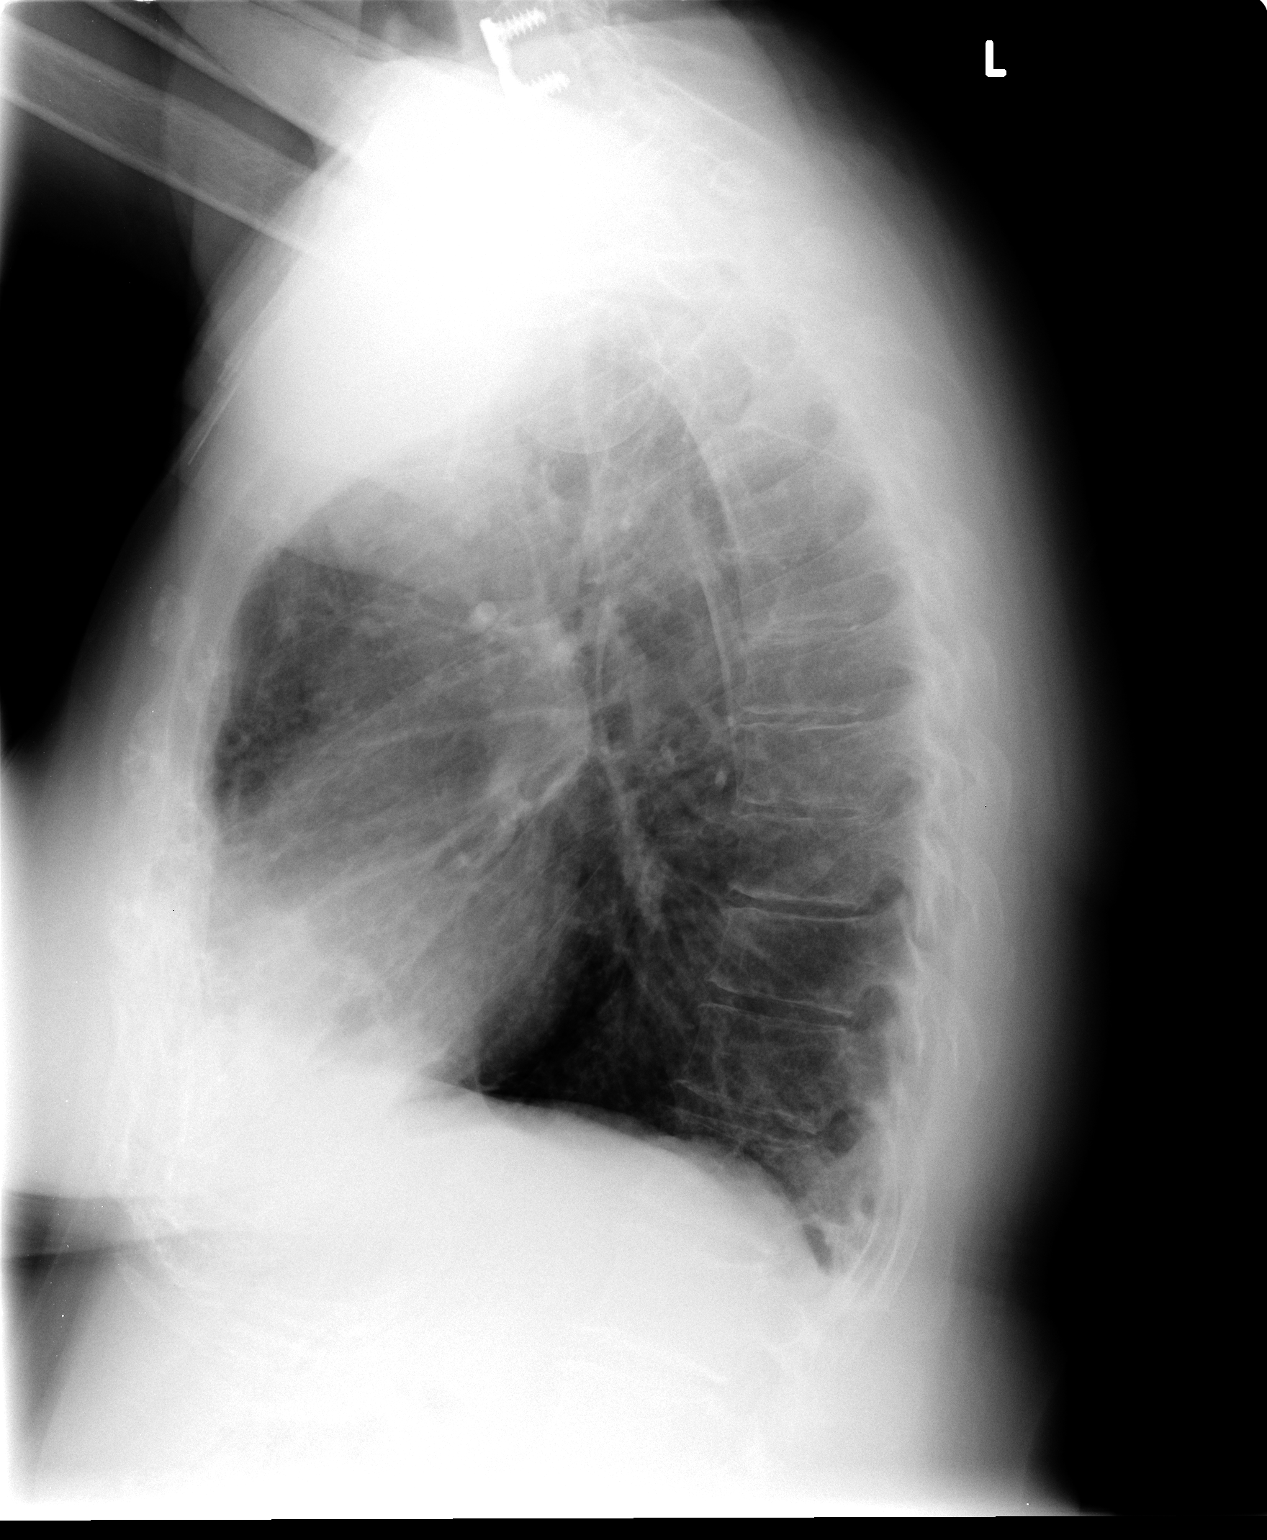

[2 of 2 positions shown; findings below may reference images not displayed]

FINDINGS: Trachea is midline.  Heart size normal.  Right subclavian
Port-A-Cath tip projects near the junction of the brachiocephalic
veins.  Biapical pleural parenchymal scarring.  A small nodular
density projects over the left upper lobe, appears less prominent
when compared to 12/14/2009.  Reticulonodular densities are seen at
the lung bases, and appear similar to prior studies.  No pleural
fluid.
IMPRESSION: Reticulonodular densities at both lung bases appear similar to
prior studies may represent post infectious scarring.  No focal
airspace consolidation.

## 2011-12-24 IMAGING — PT NM PET TUM IMG RESTAG (PS) SKULL BASE T - THIGH
6 series · 25 of 25 positions shown · non-contrast
Comparison: 02/17/2010

CLINICAL DATA: Subsequent treatment strategy for non-Hodgkins
lymphoma.

NUCLEAR MEDICINE PET CT RESTAGING (PS) SKULL BASE TO THIGH
TECHNIQUE: 17.2 mCi F-18 FDG was injected intravenously via the
right arm.  Full-ring PET imaging was performed from the skull base
through the mid-thighs 61  minutes after injection.  CT data was
obtained and used for attenuation correction and anatomic
localization only.  (This was not acquired as a diagnostic CT
examination.)
Fasting Blood Glucose:  81

[Series 1: pet ac · axial · 3.3mm · 4.69mm/px · z∈[-870,+0]mm · 5 of 267 slices shown]
[im 1/267]
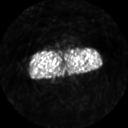
[im 67/267]
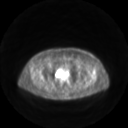
[im 134/267]
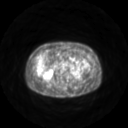
[im 200/267]
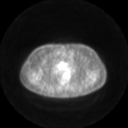
[im 267/267]
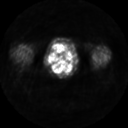

[Series 2: pet nac · axial · 3.3mm · 4.69mm/px · z∈[-870,+0]mm · 6 of 267 slices shown]
[im 1/267]
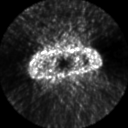
[im 54/267]
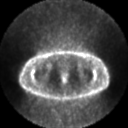
[im 107/267]
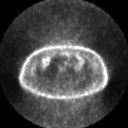
[im 160/267]
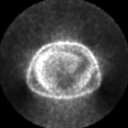
[im 213/267]
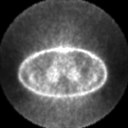
[im 267/267]
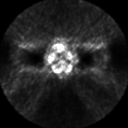

[Series 2: ct images · axial · 3.8mm · 0.98mm/px · z∈[-870,+0]mm · 6 of 267 slices shown]
[im 1/267]
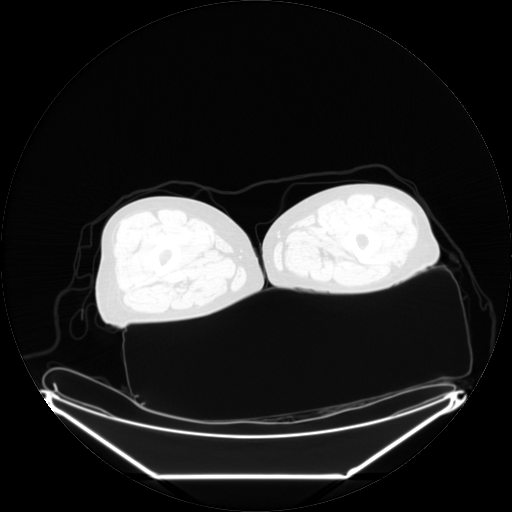
[im 54/267]
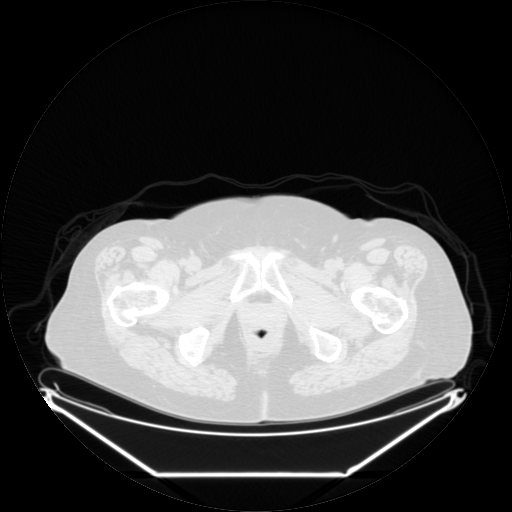
[im 107/267]
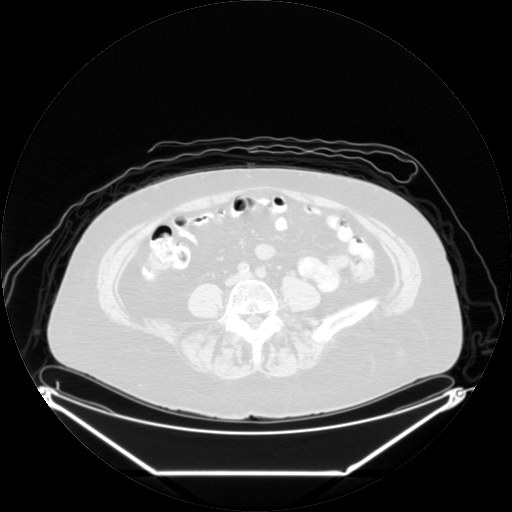
[im 160/267]
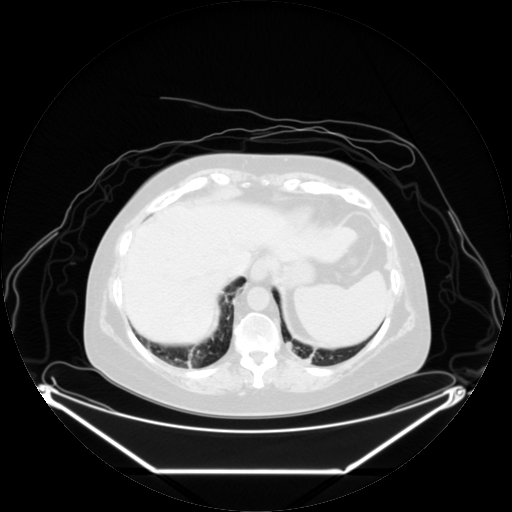
[im 213/267]
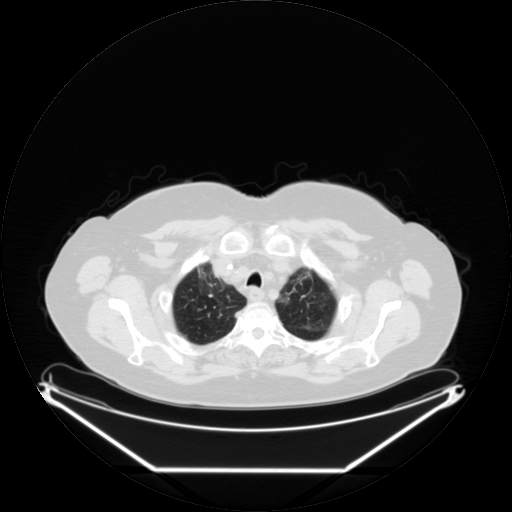
[im 267/267]
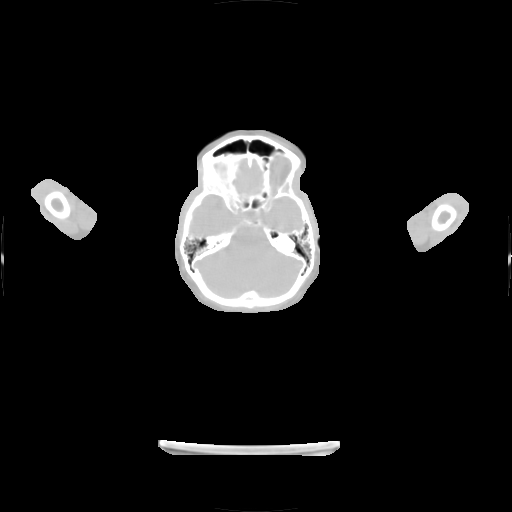

[Series 123: mip · coronal · 3.3mm · 4.69mm/px · 1 of 30 slices shown]
[im 1/30]
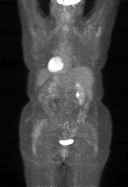

[Series 151: reformatted · axial · 3.3mm · 3.91mm/px · z∈[-870,+0]mm · 6 of 267 slices shown (1 of 2)]
[im 1/267]
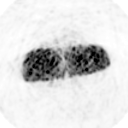
[im 54/267]
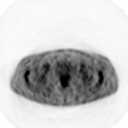
[im 107/267]
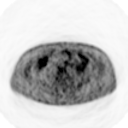
[im 160/267]
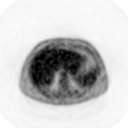
[im 213/267]
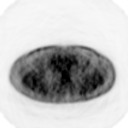
[im 267/267]
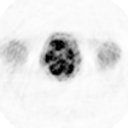

[Series 153: reformatted · coronal · 4.7mm · 6.98mm/px · 1 of 67 slices shown (2 of 2)]
[im 1/67]
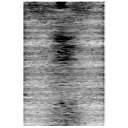

[25 of 25 positions shown; findings below may reference images not displayed]

FINDINGS: A solitary focus of hypermetabolic activity is seen in
the left posterior nasopharynx on image 15 which is new and has a
maximum SUV of 5.0.  This corresponds with mild asymmetric
nasopharyngeal soft tissue prominence at the site, and may be
inflammatory or neoplastic.  No other sites of abnormal
hypermetabolic activity are identified within the neck, chest,
abdomen, or pelvis.

Asymmetric soft tissue density encasing the left renal hilum is
stable and shows no hypermetabolic activity.  This consistent with
treated lymphoma.
IMPRESSION: 1.  New solitary focus of hypermetabolic activity in the left
posterior nasopharynx, which is indeterminate.  Although this may
be inflammatory, neoplasm cannot definitely be excluded.
2.  No other sites of hypermetabolic disease identified.

## 2012-01-13 IMAGING — CT CT NECK W/ CM
2 of 8 series · 5 of 20 positions shown, 6 images · IV contrast (100ml omni 300)
Comparison: None.
COMPARISON: CT 01/23/2010

CLINICAL DATA: Non-Hodgkins lymphoma.  Neck pain with swelling and
neck and face.  Rule out SVC occlusion

CT NECK WITH CONTRAST
TECHNIQUE: Multidetector CT imaging of the neck was performed
using the standard protocol following the bolus administration of
intravenous contrast.
Contrast: 100 ml [id number] IV
CLINICAL DATA: Hodgkin's lymphoma.  Neck pain with swelling of the
CT CHEST WITH CONTRAST
TECHNIQUE: Multidetector CT imaging of the chest was performed [place] intravenous contrast   administration.

[Series 2: chest 1st/ 70 sec delay neck · axial · delayed · 0.69mm/px · z∈[-293,-193]mm · 2 of 116 slices shown, 3 images]
[im 39/116  soft-tissue]
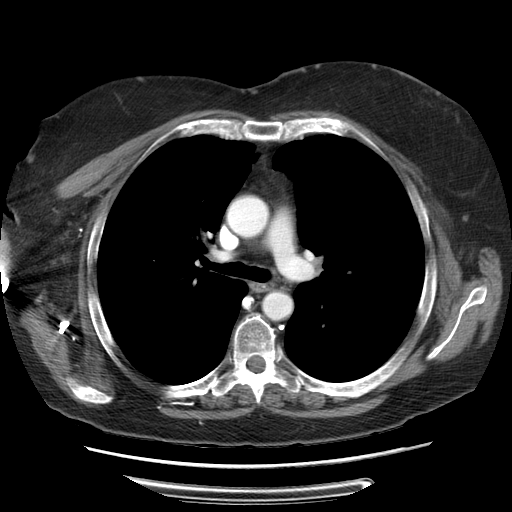
[im 39/116  bone]
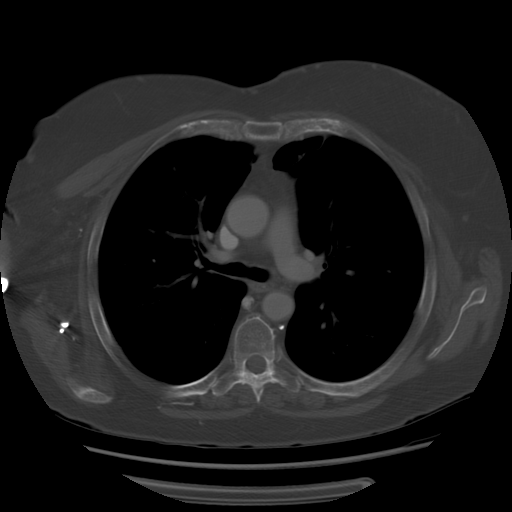
[im 77/116  bone]
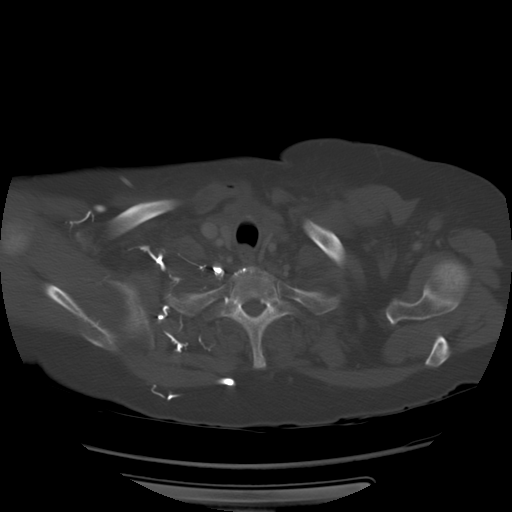

[Series 100: recon 3: chest 1st/ 70 sec (person_name) · axial · 0.36mm/px · z∈[-206,-102]mm · 3 of 167 slices shown]
[im 42/167  bone]
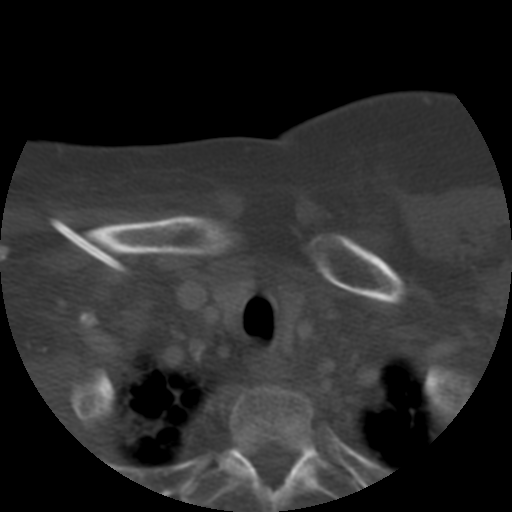
[im 84/167  bone]
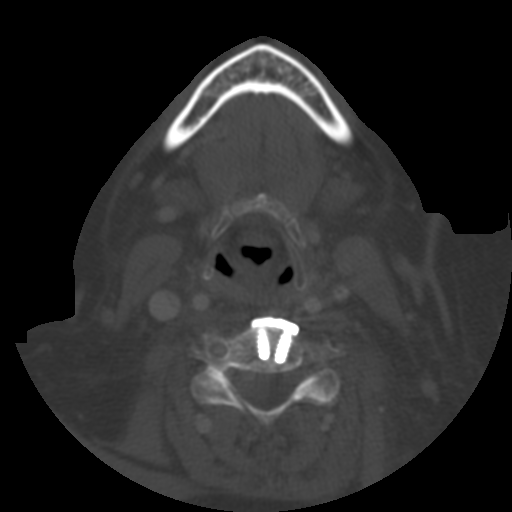
[im 125/167  bone]
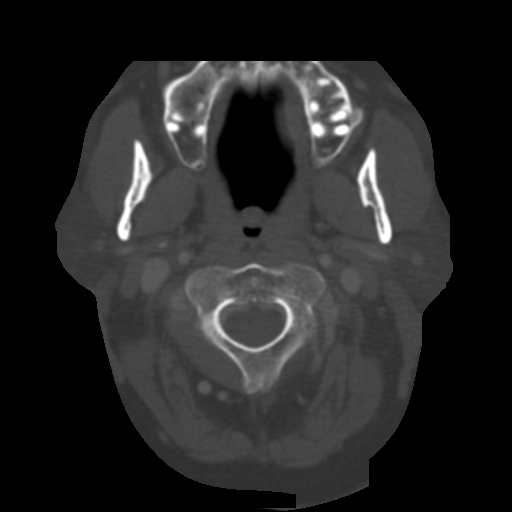

[5 of 20 positions shown; findings below may reference images not displayed]

FINDINGS: The right jugular vein is widely patent throughout its
course to the innominate vein.  The left jugular vein is patent at
the skull base and in the mid neck.  It then becomes small and
irregular and appears to have a very small lumen just before it
joins with the  left subclavian vein.  This appears to be due to
chronic thrombosis.  Mild edema in the fat around the vein.  Both
carotid arteries are open.

Air-fluid levels in the sphenoid sinus compatible with sinusitis.

Negative for mass or adenopathy in the neck.  ACDF C5-6 and C6-7.
IMPRESSION: Left jugular vein is atretic and occludes a nearly occludes as it
joins the left subclavian vein.  Small filling defect is noted in
the distal jugular vein on the left.

Right jugular vein is normal

Negative for mass lesion.

Sinusitis.
FINDINGS: Right arm was injected with contrast for the study.
Right subclavian  Port-A-Cath tip is in the SVC.

  There is a narrow jet of contrast getting through the SVC with
high density contrast in the SVC below the Port-A-Cath tip. There
is reflux into the azygos vein.  There is a high-grade stricture of
the SVC at the level of the distal Port-A-Cath. Possible clot in
the SVC around the catheter.  Reflux of contrast into the left
innominate vein which is patent.

Negative for mass or adenopathy in the chest.

Mild scarring in the left upper lobe anteriorly.  Negative for
pneumonia.  Negative for pleural effusion.

Mild compression fracture T3, unchanged from prior CT
IMPRESSION: High-grade stricture of the superior vena cava with a small patent
lumen in the SVC.  No associated mass or adenopathy.  The right
sided Port-A-Cath tip is in the SVC.

Negative for pneumonia.

## 2012-01-17 ENCOUNTER — Encounter (HOSPITAL_COMMUNITY)
Admission: RE | Admit: 2012-01-17 | Discharge: 2012-01-17 | Disposition: A | Discharge status: Home / Self Care | Payer: BC Managed Care – PPO | Source: Ambulatory Visit | Attending: Oncology | Admitting: Oncology

## 2012-01-17 DIAGNOSIS — R413 Other amnesia: Secondary | ICD-10-CM

## 2012-01-17 DIAGNOSIS — R918 Other nonspecific abnormal finding of lung field: Secondary | ICD-10-CM | POA: Insufficient documentation

## 2012-01-17 DIAGNOSIS — C8589 Other specified types of non-Hodgkin lymphoma, extranodal and solid organ sites: Secondary | ICD-10-CM

## 2012-01-17 DIAGNOSIS — J329 Chronic sinusitis, unspecified: Secondary | ICD-10-CM | POA: Insufficient documentation

## 2012-01-17 MED ORDER — FLUDEOXYGLUCOSE F - 18 (FDG) INJECTION
21.5000 | Freq: Once | INTRAVENOUS | Status: AC | PRN
  Administered 2012-01-17: 21.5 via INTRAVENOUS

## 2012-01-18 ENCOUNTER — Encounter (HOSPITAL_COMMUNITY): Payer: BC Managed Care – PPO | Attending: Oncology | Admitting: Oncology

## 2012-01-18 ENCOUNTER — Telehealth: Payer: Self-pay | Admitting: Critical Care Medicine

## 2012-01-18 VITALS — BP 115/70 | HR 68 | Temp 97.5°F | Resp 20 | Wt 177.8 lb

## 2012-01-18 DIAGNOSIS — C8589 Other specified types of non-Hodgkin lymphoma, extranodal and solid organ sites: Secondary | ICD-10-CM | POA: Insufficient documentation

## 2012-01-18 DIAGNOSIS — R911 Solitary pulmonary nodule: Secondary | ICD-10-CM

## 2012-01-18 DIAGNOSIS — J329 Chronic sinusitis, unspecified: Secondary | ICD-10-CM

## 2012-01-18 LAB — COMPREHENSIVE METABOLIC PANEL
AST: 15 U/L (ref 0–37)
Albumin: 3.4 g/dL — ABNORMAL LOW (ref 3.5–5.2)
Calcium: 9.2 mg/dL (ref 8.4–10.5)
Creatinine, Ser: 1.11 mg/dL — ABNORMAL HIGH (ref 0.50–1.10)
GFR calc non Af Amer: 54 mL/min — ABNORMAL LOW (ref >90)
Sodium: 141 mEq/L (ref 135–145)
Total Protein: 6.4 g/dL (ref 6.0–8.3)

## 2012-01-18 LAB — CBC WITH DIFFERENTIAL/PLATELET
Basophils Absolute: 0 10*3/uL (ref 0.0–0.1)
Basophils Relative: 0 % (ref 0–1)
Eosinophils Absolute: 0 10*3/uL (ref 0.0–0.7)
Eosinophils Relative: 0 % (ref 0–5)
HCT: 35.5 % — ABNORMAL LOW (ref 36.0–46.0)
MCHC: 32.4 g/dL (ref 30.0–36.0)
MCV: 93.2 fL (ref 78.0–100.0)
Monocytes Absolute: 0.7 10*3/uL (ref 0.1–1.0)
Platelets: 215 10*3/uL (ref 150–400)
RDW: 14.5 % (ref 11.5–15.5)

## 2012-01-18 NOTE — Telephone Encounter (Signed)
I spoke to the pt regarding her lung nodule. I plan a Super D CT chest and then have pt see me in one week with probable ENB with [place] Pt already has an appt. Order for super D CT placed

## 2012-01-18 NOTE — Telephone Encounter (Signed)
Message copied by Storm Frisk on Wed Jan 18, 2012  9:59 AM ------      Message from: Mariel Sleet, ERIC S      Created: Tue Jan 17, 2012 12:03 PM       Hi Dennie Bible,      Please look at her PET scan and see what you think about this pulm nodule that is felt to be malignant.  Do you think she could undergo surgery with her issues???      [name]

## 2012-01-18 NOTE — Telephone Encounter (Signed)
Chest ct scheduled at [facility] 01/20/12 pt is aware Tobe Sos

## 2012-01-18 NOTE — Patient Instructions (Signed)
[facility] Discharge Instructions  RECOMMENDATIONS MADE BY THE CONSULTANT AND ANY TEST RESULTS WILL BE SENT TO YOUR REFERRING PHYSICIAN.  EXAM FINDINGS BY THE PHYSICIAN TODAY AND SIGNS OR SYMPTOMS TO REPORT TO CLINIC OR PRIMARY PHYSICIAN: Exam findings as discussed by Dr. Mariel Sleet; you look as though you're doing well.  SPECIAL INSTRUCTIONS/FOLLOW-UP: 1.  Please keep your appointment in 6 months to be seen by T. Kefalas, PA-C. 2.  We did labs today and will contact you after Dr. Mariel Sleet has been able to review the results.  Thank you for choosing [name] to provide your oncology and hematology care.  To afford each patient quality time with our providers, please arrive at least 15 minutes before your scheduled appointment time.  With your help, our goal is to use those 15 minutes to complete the necessary work-up to ensure our physicians have the information they need to help with your evaluation and healthcare recommendations.    Effective January 1st, 2014, we ask that you re-schedule your appointment with our physicians should you arrive 10 or more minutes late for your appointment.  We strive to give you quality time with our providers, and arriving late affects you and other patients whose appointments are after yours.    Again, thank you for choosing [facility].  Our hope is that these requests will decrease the amount of time that you wait before being seen by our physicians.       _____________________________________________________________  Should you have questions after your visit to [facility], please contact our office at [phone] between the hours of 8:30 a.m. and 5:00 p.m.  Voicemails left after 4:30 p.m. will not be returned until the following business day.  For prescription refill requests, have your pharmacy contact our office with your prescription refill request.

## 2012-01-18 NOTE — Progress Notes (Signed)
Problem #1 diffuse large B cell lymphoma, stage IV B. status post R. CHOP x4 cycles along with intrathecal chemotherapy during cycles 3 and 4 with methotrexate, 12 mg and slight Cortef 50 mg. Her chemotherapy had to be stopped due to severe Pseudomonas sepsis after cycles 2 and cycles 4. These episodes were life-threatening. Thus far she has no evidence for recurrent disease Problem #2 new right upper lobe lung nodule, positive on PET scan. She is not a smoker. We need to make sure this is not recurrent lymphoma or some other issue. It certainly could be inflammatory potentially but it is 1.4 cm in size. A super D. CAT scan is scheduled for Monday followed by followup visit with Dr. Delford Field Problem #3 chronic Pseudomonas sinusitis Problem #4 severe hypogammaglobulinemia secondary to #1 requiring intermittent IVIG Problem #5 SVC syndrome complicated by her Port-A-Cath placement and the port has been removed with resolution Problem #6 adult onset asthma x5-7 years ago now. She is never been a smoker but has his nodule now on the right upper lung on the CAT scan/PET scan. It needs further evaluation. I have communicated with Dr. Delford Field.  She is not coughing up blood but still coughs up discolored phlegm periodically she also has Green phlegm at times intermittently through her nose. She is able to work however.  She has no B. symptomatology. She had a temperature of 99 this morning but is not febrile today. She remains in no acute distress. She looks excellent at this time. She has no lymphadenopathy in cervical, supraclavicular, infraclavicular, axillary or inguinal or epitrochlear areas. She has no arm or leg edema. The old Port-A-Cath site is intact. Her lungs are revealing rales at both lung bases more on the left than the right which do not completely clear with coughing and she has an occasional wheeze in several places. She has rhonchi in several areas.. She has no obvious thyromegaly. Heart shows a  regular rhythm and rate without murmur rub or gallop. Abdomen remains soft and nontender. Bowel sounds are normal. She has no organomegaly. She is alert and oriented. her skin is warm and dry to touch. Blood work is pending from today.  We will see what her blood work shows and she needs to followup with Dr. Delford Field. We will tentatively see her ourselves in 6 months. We will see her sooner based upon what the results of the lung lesion reveal.

## 2012-01-23 ENCOUNTER — Ambulatory Visit (HOSPITAL_COMMUNITY)
Admission: RE | Admit: 2012-01-23 | Discharge: 2012-01-23 | Disposition: A | Discharge status: Home / Self Care | Payer: BC Managed Care – PPO | Source: Ambulatory Visit | Attending: Critical Care Medicine | Admitting: Critical Care Medicine

## 2012-01-23 DIAGNOSIS — R911 Solitary pulmonary nodule: Secondary | ICD-10-CM | POA: Insufficient documentation

## 2012-01-23 DIAGNOSIS — I998 Other disorder of circulatory system: Secondary | ICD-10-CM | POA: Insufficient documentation

## 2012-01-25 ENCOUNTER — Encounter: Payer: Self-pay | Admitting: Critical Care Medicine

## 2012-01-25 ENCOUNTER — Ambulatory Visit (INDEPENDENT_AMBULATORY_CARE_PROVIDER_SITE_OTHER): Payer: BC Managed Care – PPO | Admitting: Critical Care Medicine

## 2012-01-25 VITALS — BP 120/84 | HR 62 | Temp 97.4°F | Ht 62.0 in | Wt 178.6 lb

## 2012-01-25 DIAGNOSIS — J45902 Unspecified asthma with status asthmaticus: Secondary | ICD-10-CM

## 2012-01-25 DIAGNOSIS — J189 Pneumonia, unspecified organism: Secondary | ICD-10-CM

## 2012-01-25 MED ORDER — PREDNISONE 10 MG PO TABS: ORAL_TABLET | ORAL | Status: DC

## 2012-01-25 MED ORDER — SULFAMETHOXAZOLE-TMP DS 800-160 MG PO TABS: 1.0000 | ORAL_TABLET | Freq: Two times a day (BID) | ORAL | Status: DC

## 2012-01-25 NOTE — Patient Instructions (Addendum)
Bactrim one twice daily for 10days Get your INR/protime checked in the next 7days , the antibiotic will increase effect of coumadin Prednisone 10mg  Take 4 for four days 3 for four days 2 for four days 1 for four days then 1/2 daily Stay on dulera as before No indication for bronchoscopy Return 2 weeks

## 2012-01-25 NOTE — Progress Notes (Signed)
Subjective:    Patient ID: [name], female    DOB: 04-07-1955, 57 y.o.   MRN: [id number]  HPI  57 y.o.WF  Asthma , hypogammaglobulinemia, chronic sinusits d/t pseudomonas Hx of Diffuse large B-Cell Lymphoma, S/P R-CHOP x 4 cycles with intrathecal chemotherapy during cycles 3 and 4 with methotrexate and Solu-Cortef.  Treatment was stopped due to Pseudomonas sepsis after cycles 2 and 4 which was very severe.     Rituxan alone  Every month, last dose 09/30/11. Now on Hold. PET due in 01/2012   11/07/2011 Acute OV  Complains of prod cough with yellow mucus, head congestion with yellow drainage, some PND, increased SOB, some wheezing, tightness w/ coughing, fever x10days - scheduled for IVIG 11/5-6  Coumadin checks ok, INR last 2.5, - 2 weeks ago.  Currently on  Prednisone 5mg  daily  No hemoptysis . Ran fever initially -low grade . No fever for 3 days .  No edema.   12/14/2011 Pt with more cough and chest tightness.  No CXR done . Rx Levaquin x 10days and this helped per pcp. On prednisone 5mg  /d  No hemoptysis.  No fever now.  No sweats or chills.  Notes sinus pressure as before.  PUL ASTHMA HISTORY 01/25/2012 12/14/2011 10/11/2011 08/12/2011 06/17/2011  Symptoms Daily Daily >2 days/week 0-2 days/week 0-2 days/week  Nighttime awakenings Often--7/wk 3-4/month 0-2/month 0-2/month 3-4/month  Interference with activity Some limitations Some limitations Minor limitations Minor limitations Minor limitations  SABA use Several times/day Daily > 2 days/wk--not > 1 x/day 0-2 days/wk 0-2 days/wk  Exacerbations requiring oral steroids 2 or more / year 2 or more / year 0-1 / year 0-1 / year 0-1 / year   01/25/2012 As the last visit the patient is gotten much worse with increasing cough productive of thick yellow mucus mixed with blood. The patient is also more dyspneic. There is no fever chills or sweats. Recent PET scan showed a nodular density right upper lobe. Followup CT scan of the chest demonstrated  this nodule is now more groundglass inflammatory infiltrate. There is no evidence of recurrent lymphoma seen on the recent PET scan.      Review of Systems  Constitutional:   No  weight loss, night sweats,  Fevers, chills, fatigue, lassitude. HEENT:   No headaches,  Difficulty swallowing,  Tooth/dental problems,  Sore throat,                No sneezing, itching, ear ache, +++nasal congestion,+++ post nasal drip,   CV:  No chest pain,  Orthopnea, PND, swelling in lower extremities, anasarca, dizziness, palpitations  GI  No heartburn, indigestion, abdominal pain, nausea, vomiting, diarrhea, change in bowel habits, loss of appetite  Resp: No shortness of breath with exertion or at rest.  +++ excess mucus, +++ productive cough,  No non-productive cough,  +++ coughing up of blood.  +++ change in color of mucus.  No wheezing.  No chest wall deformity  Skin: no rash or lesions.  GU: no dysuria, change in color of urine, no urgency or frequency.  No flank pain.  MS:  No joint pain or swelling.  No decreased range of motion.  No back pain.  Psych:  No change in mood or affect. No depression or anxiety.  No memory loss.     Objective:   Physical Exam  Filed Vitals:   01/25/12 1620  BP: 120/84  Pulse: 62  Temp: 97.4 F (36.3 C)  TempSrc: Oral  Height: 5\' 2"  (  1.575 m)  Weight: 178 lb 9.6 oz (81.012 kg)  SpO2: 94%    Gen: Pleasant, well-nourished, in no distress,  normal affect  ENT: No lesions,  mouth clear,  oropharynx clear, ++ postnasal drip, bilateral nasal purulence  Neck: No JVD, no TMG, no carotid bruits  Lungs: No use of accessory muscles, no dullness to percussion, expired wheezes with poor airflow   Cardiovascular: RRR, heart sounds normal, no murmur or gallops, no peripheral edema  Abdomen: soft and NT, no HSM,  BS normal  Musculoskeletal: No deformities, no cyanosis or clubbing  Neuro: alert, non focal  Skin: Warm, no lesions or rashes  REcent CT of chest  reviewed along with PET scan. See epic reports.        Assessment & Plan:   Pneumonia Right upper lobe ground glass density with positive PET scan. Initial concern was that of malignancy however this lesion has evolved to be more of a pneumonitis pattern. There is no evidence of malignancy on current CT scan compared to prior PET scan I suspect that the density in the right upper lobe is pneumonia and is mucus plugging. The patient is present previously been infected with Morexella catarrhalis and Pseudomonas. The more recent bacteria isolate was [place]  catarrhalis. The patient has responded well to Bactrim in the past. I would prefer to avoid IV antibiotics as her intravenous access is very limited given the superior vena cava syndrome and prior upper extremity venous obstructions. Plan 10 days of Bactrim Pulse prednisone No indication for bronchoscopy Maintain Dulera Return short-term    Updated Medication List Outpatient Encounter Prescriptions as of 01/25/2012  Medication Sig Dispense Refill  . ACCU-CHEK AVIVA PLUS test strip 1 each by Other route 2 (two) times a week.       [name] Kitchen acyclovir (ZOVIRAX) 400 MG tablet Take 1 tablet twice daily  60 tablet  6  . albuterol (PROVENTIL) (2.5 MG/3ML) 0.083% nebulizer solution Take 2.5 mg by nebulization every 4 (four) hours as needed.  120 mL  6  . albuterol (VENTOLIN HFA) 108 (90 BASE) MCG/ACT inhaler Inhale 2 puffs into the lungs every 6 (six) hours as needed.  1 Inhaler  5  . benzonatate (TESSALON) 200 MG capsule Take 1 capsule by mouth Three times daily as needed.      . citalopram (CELEXA) 20 MG tablet Take 20 mg by mouth every morning.        . furosemide (LASIX) 20 MG tablet Take 1 tablet by mouth every other day as needed. *Usually takes as needed for fluid retention upon weighing in**      . Lancets (ACCU-CHEK MULTICLIX) lancets 1 each by Other route 2 (two) times a week.       . Mometasone Furo-Formoterol Fum 200-5 MCG/ACT AERO  Inhale 2 Inhalers into the lungs 2 (two) times daily. BRAND NAME: DULERA      . omeprazole (PRILOSEC) 20 MG capsule Take 1 capsule (20 mg total) by mouth 2 (two) times daily.  60 capsule  6  . predniSONE (DELTASONE) 10 MG tablet Take 4 for four days 3 for four days 2 for four days 1 for four days then 1/2 daily  60 tablet  11  . simvastatin (ZOCOR) 80 MG tablet Take 80 mg by mouth at bedtime.        [name] Kitchen warfarin (COUMADIN) 1 MG tablet Take 1 mg by mouth as directed.       . warfarin (COUMADIN) 10 MG tablet Take 5 mg  by mouth as directed.       . zafirlukast (ACCOLATE) 20 MG tablet Take 1 tablet (20 mg total) by mouth 2 (two) times daily.  60 tablet  5  . [DISCONTINUED] predniSONE (DELTASONE) 10 MG tablet Take 4 for three days 3 for three days 2 for three days 1 for three days then 1/2 daily  60 tablet  6  . [DISCONTINUED] predniSONE (DELTASONE) 10 MG tablet Take 5 mg by mouth daily.      . Hydrocod Polst-Chlorphen Polst (TUSSICAPS) 10-8 MG CP12 Take 1 capsule by mouth 2 (two) times daily as needed.  20 each  0  . potassium chloride 40 MEQ/15ML (20%) LIQD ON HOLD      . sulfamethoxazole-trimethoprim (BACTRIM DS) 800-160 MG per tablet Take 1 tablet by mouth 2 (two) times daily.  20 tablet  0   Facility-Administered Encounter Medications as of 01/25/2012  Medication Dose Route Frequency Provider Last Rate Last Dose  . 0.9 %  sodium chloride infusion   Intravenous Continuous Randall An, MD 20 mL/hr at 05/12/11 0914    . Immune Globulin 5% (OCTAGAM) SOLN 10 g  10 g Intravenous Once Ellouise Newer, [place]      . Immune Globulin 5% (OCTAGAM) SOLN 70 g  70 g Intravenous Once Ellouise Newer, [place]

## 2012-01-26 DIAGNOSIS — J189 Pneumonia, unspecified organism: Secondary | ICD-10-CM | POA: Insufficient documentation

## 2012-01-26 NOTE — Assessment & Plan Note (Signed)
Right upper lobe ground glass density with positive PET scan. Initial concern was that of malignancy however this lesion has evolved to be more of a pneumonitis pattern. There is no evidence of malignancy on current CT scan compared to prior PET scan I suspect that the density in the right upper lobe is pneumonia and is mucus plugging. The patient is present previously been infected with Morexella catarrhalis and Pseudomonas. The more recent bacteria isolate was [place]  catarrhalis. The patient has responded well to Bactrim in the past. I would prefer to avoid IV antibiotics as her intravenous access is very limited given the superior vena cava syndrome and prior upper extremity venous obstructions. Plan 10 days of Bactrim Pulse prednisone No indication for bronchoscopy Maintain Dulera Return short-term

## 2012-01-28 IMAGING — CT CT CHEST W/ CM
2 of 3 series · 15 of 36 positions shown, 18 images · IV contrast (agent unspecified)
Comparison: 05/23/2010

CLINICAL DATA: Shortness of breath.  Neck and chest swelling.
Coughing up blood.  Previous records indicate Hodgkin's lymphoma.

CT CHEST WITH CONTRAST
TECHNIQUE: Multidetector CT imaging of the chest was performed
following the standard protocol during bolus administration of
intravenous contrast.
Contrast: 80 ml [id number]

[Series 2: routine chest 5.0 st · axial · 0.62mm/px · z∈[-318,-63]mm · 12 of 61 slices shown, 15 images]
[im 5/61  mediastinal]
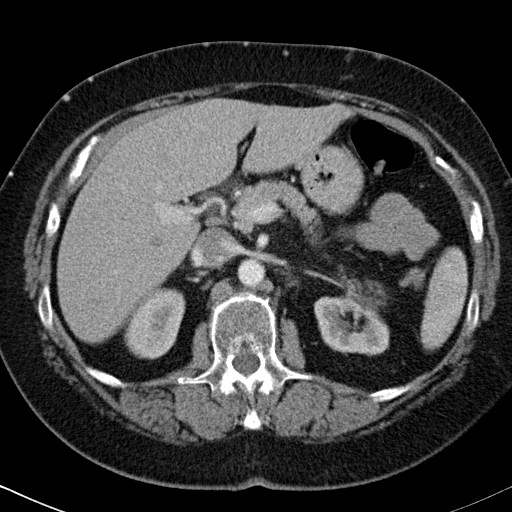
[im 5/61  lung]
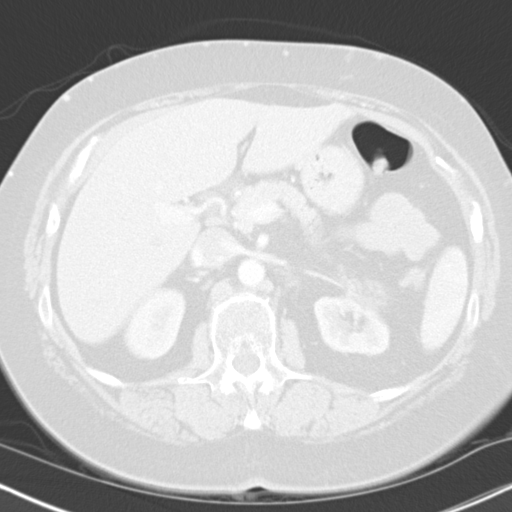
[im 9/61  lung]
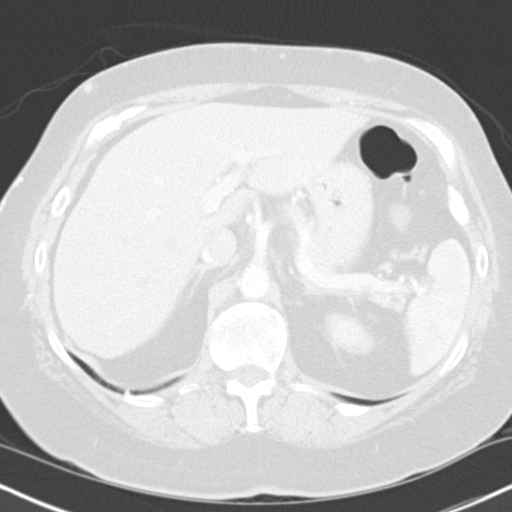
[im 14/61  lung]
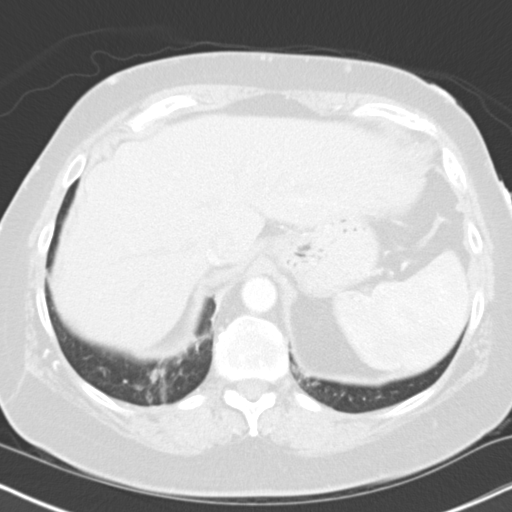
[im 18/61  lung]
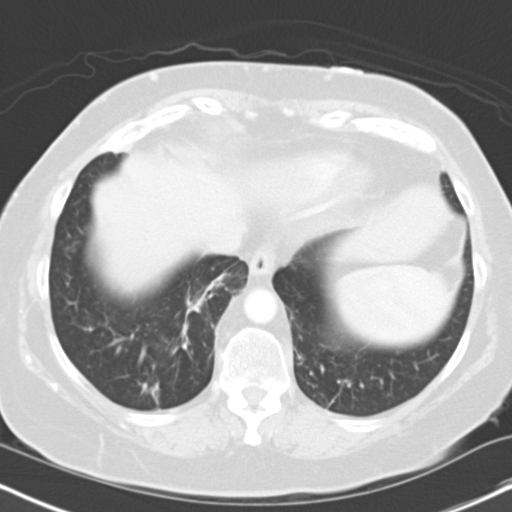
[im 23/61  mediastinal]
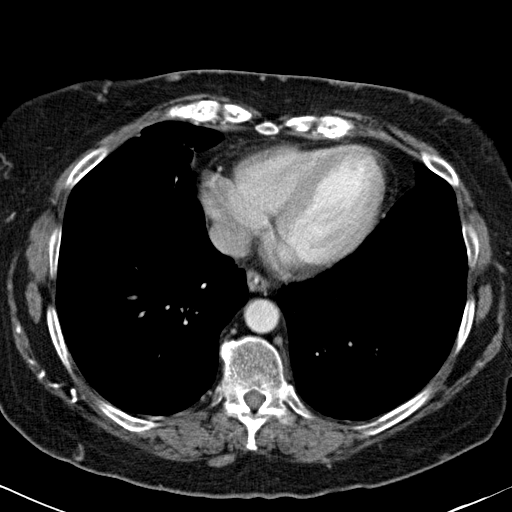
[im 23/61  lung]
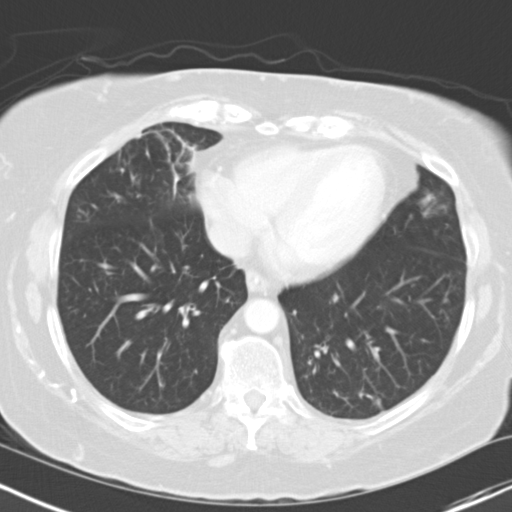
[im 27/61  lung]
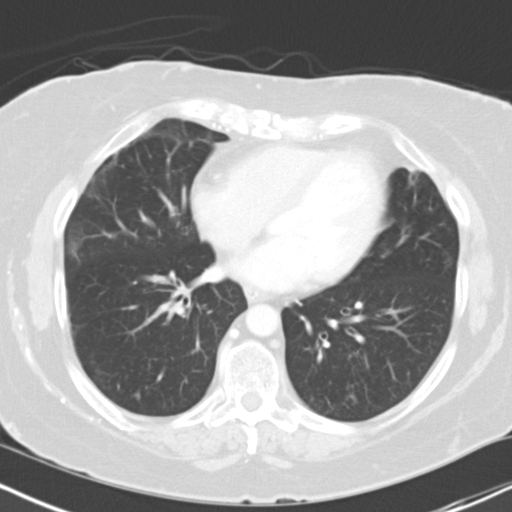
[im 34/61  lung]
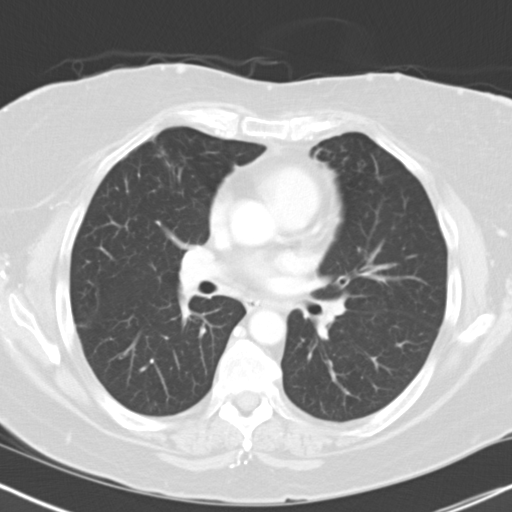
[im 38/61  lung]
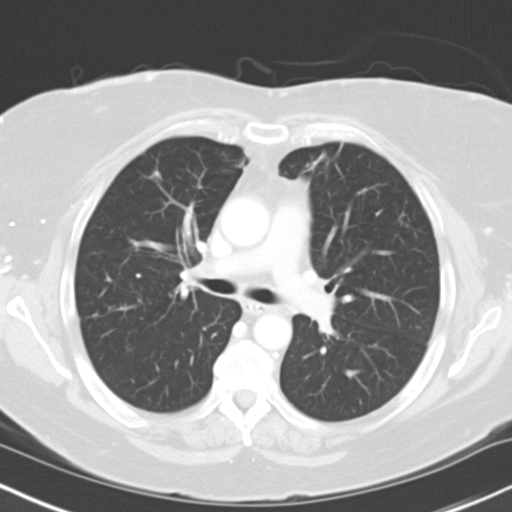
[im 43/61  mediastinal]
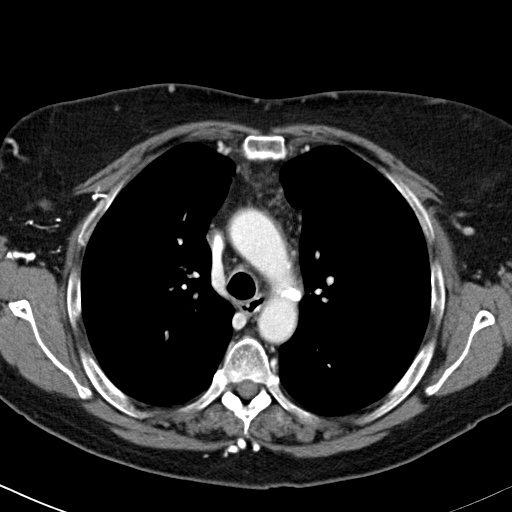
[im 43/61  lung]
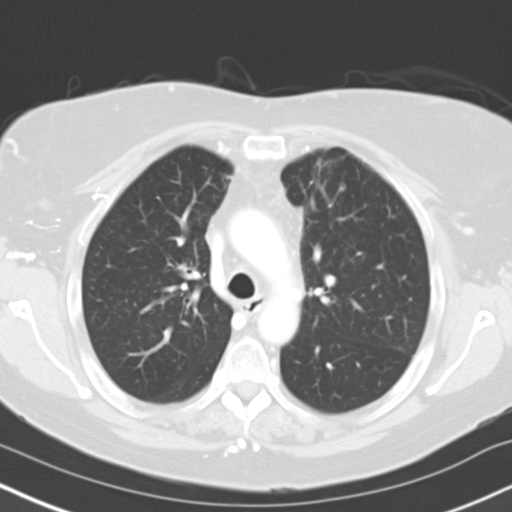
[im 47/61  lung]
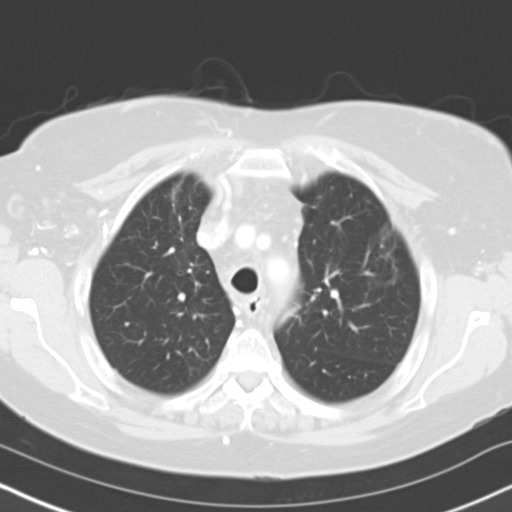
[im 52/61  lung]
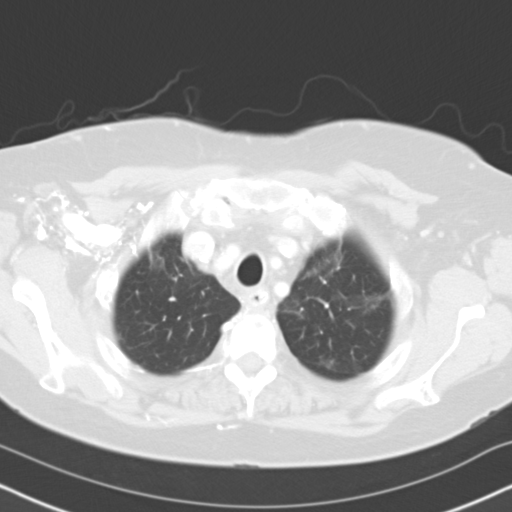
[im 56/61  lung]
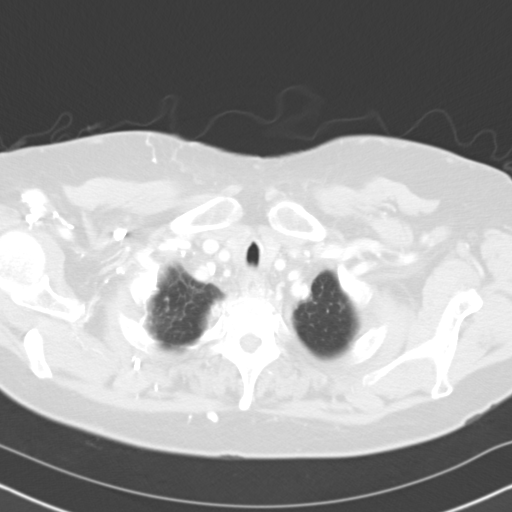

[Series 602: cor · coronal · 0.62mm/px · 3 of 111 slices shown]
[im 23/111  lung]
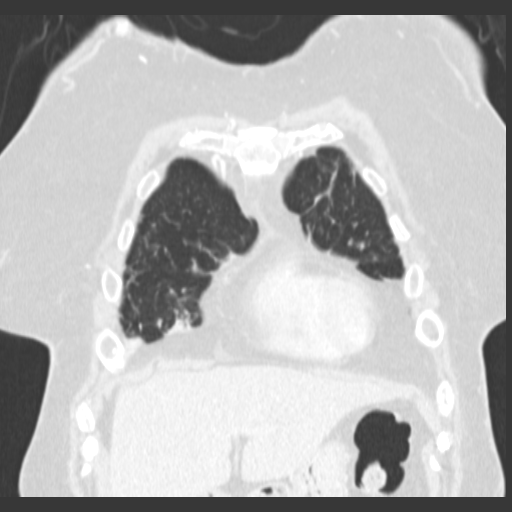
[im 45/111  lung]
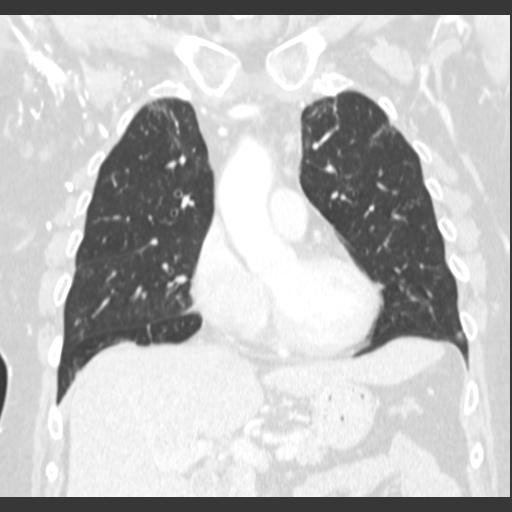
[im 67/111  lung]
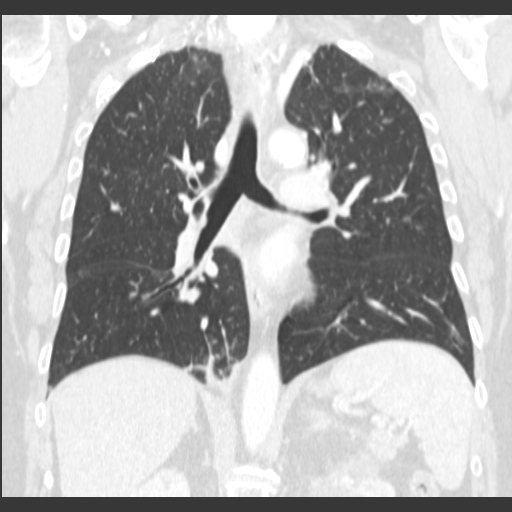

[15 of 36 positions shown; findings below may reference images not displayed]

FINDINGS: Right-sided Infuse-A-Port with tip in the mid/superior
SVC.  Again demonstrated is a focal clot or fibrin sheath around
the distal catheter tip causing near occlusion of the vessel.
Atretic left jugular vein again demonstrated. Collateral veins in
the right axillary and supraclavicular regions.  Normal caliber
thoracic aorta.  No significant lymphadenopathy in the chest. No
pleural effusion.  Emphysematous changes and scattered fibrosis in
the lungs.  No focal airspace consolidation.  No significant
pulmonary nodules.  No significant changes since the prior study.
Degenerative changes in the thoracic spine.  No vertebral
compression fractures.  Normal alignment demonstrated.

Visualized portions of the upper abdominal organs are grossly
unremarkable.
IMPRESSION: No acute abnormalities demonstrated in the chest.  No evidence of
significant lymphadenopathy.  Again demonstrated are atresia of the
left jugular vein and focal clot or fibrin sheath around the distal
central venous catheter causing near occlusion of the superior vena
cava with associated collaterals.

## 2012-01-28 IMAGING — CR DG CHEST 2V
2 series · 2 of 2 positions shown · non-contrast
Comparison: 05/23/2010

CLINICAL DATA: Shortness of breath.  Cough.  Fever.  Lymphoma.
Asthma.

CHEST - 2 VIEW

[w chest pa]
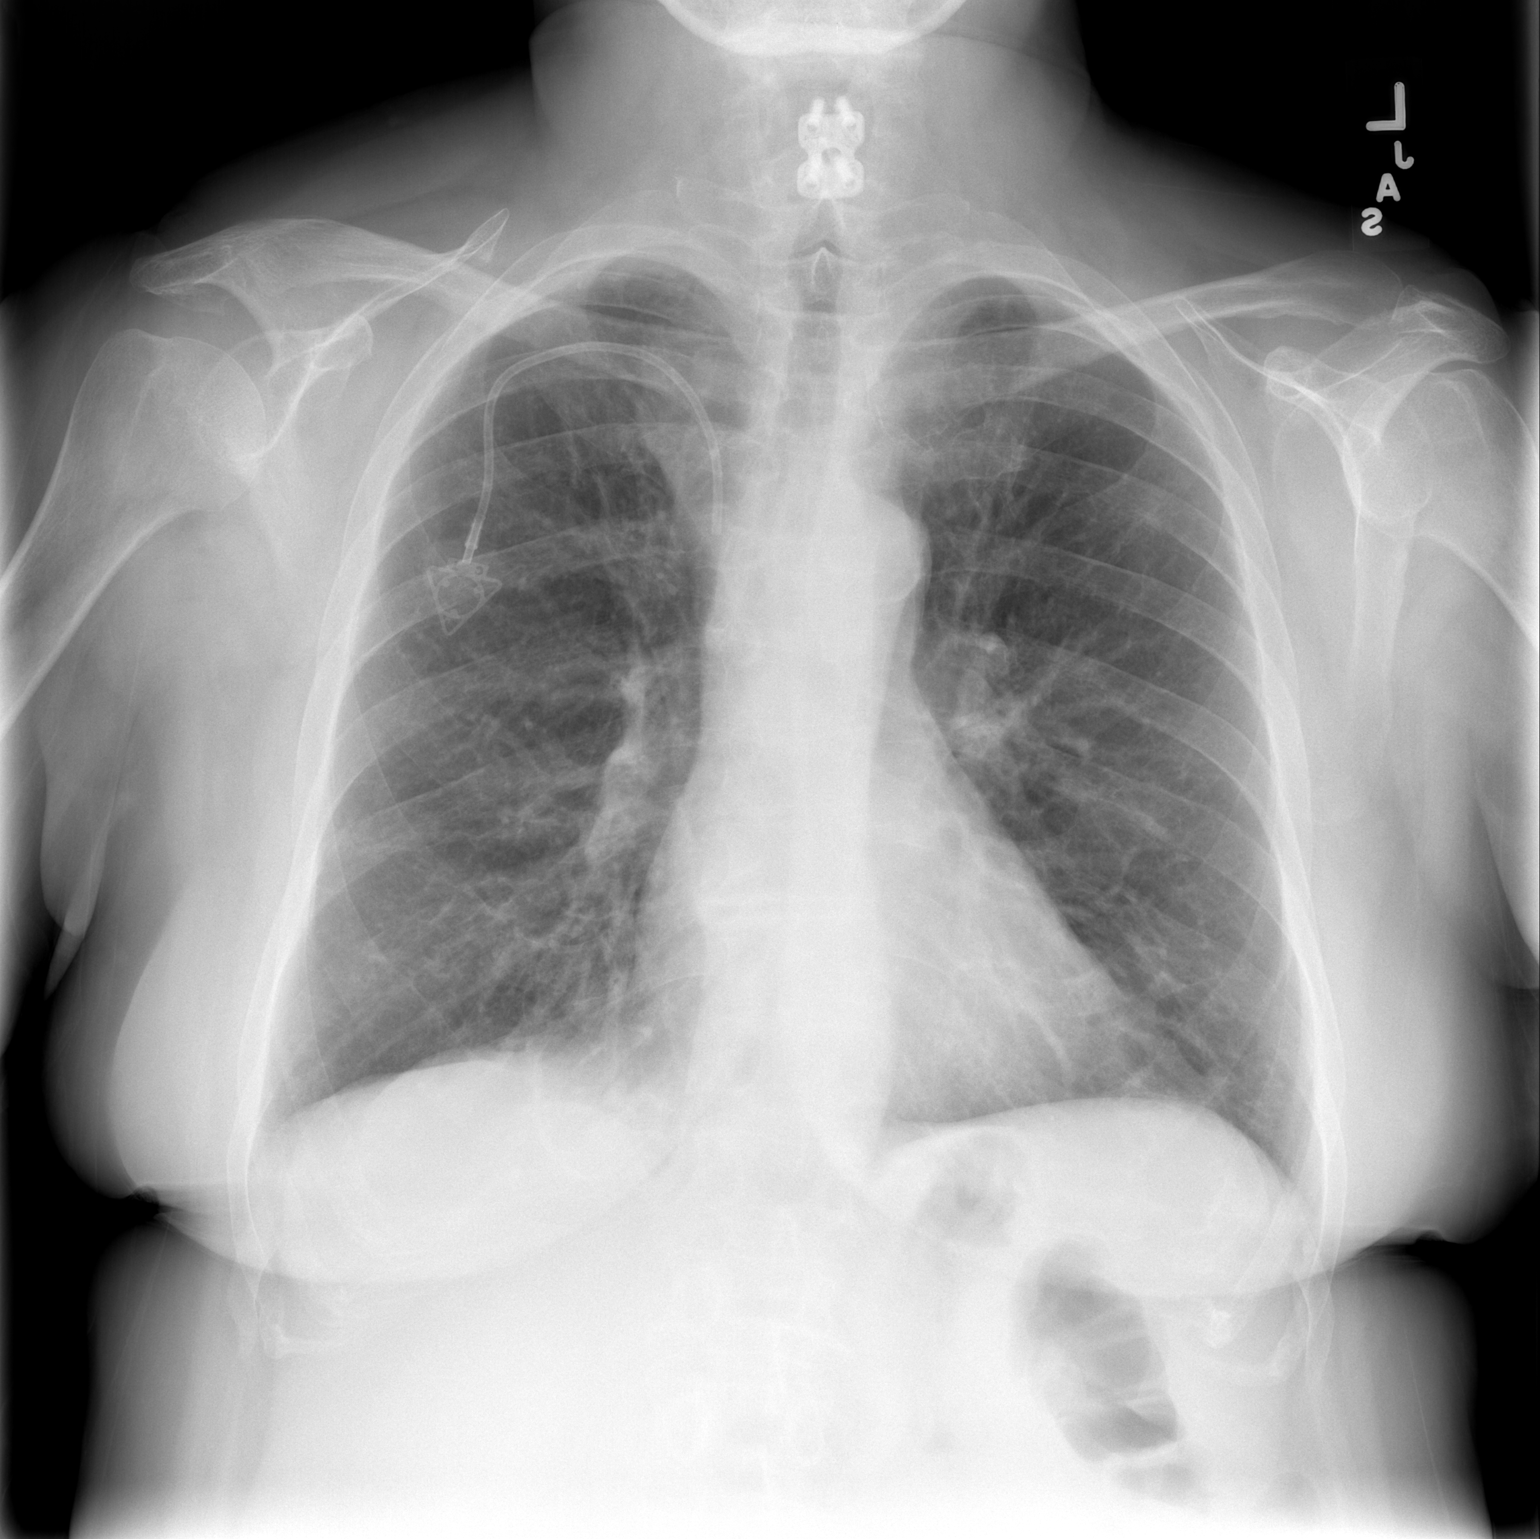

[w chest lat]
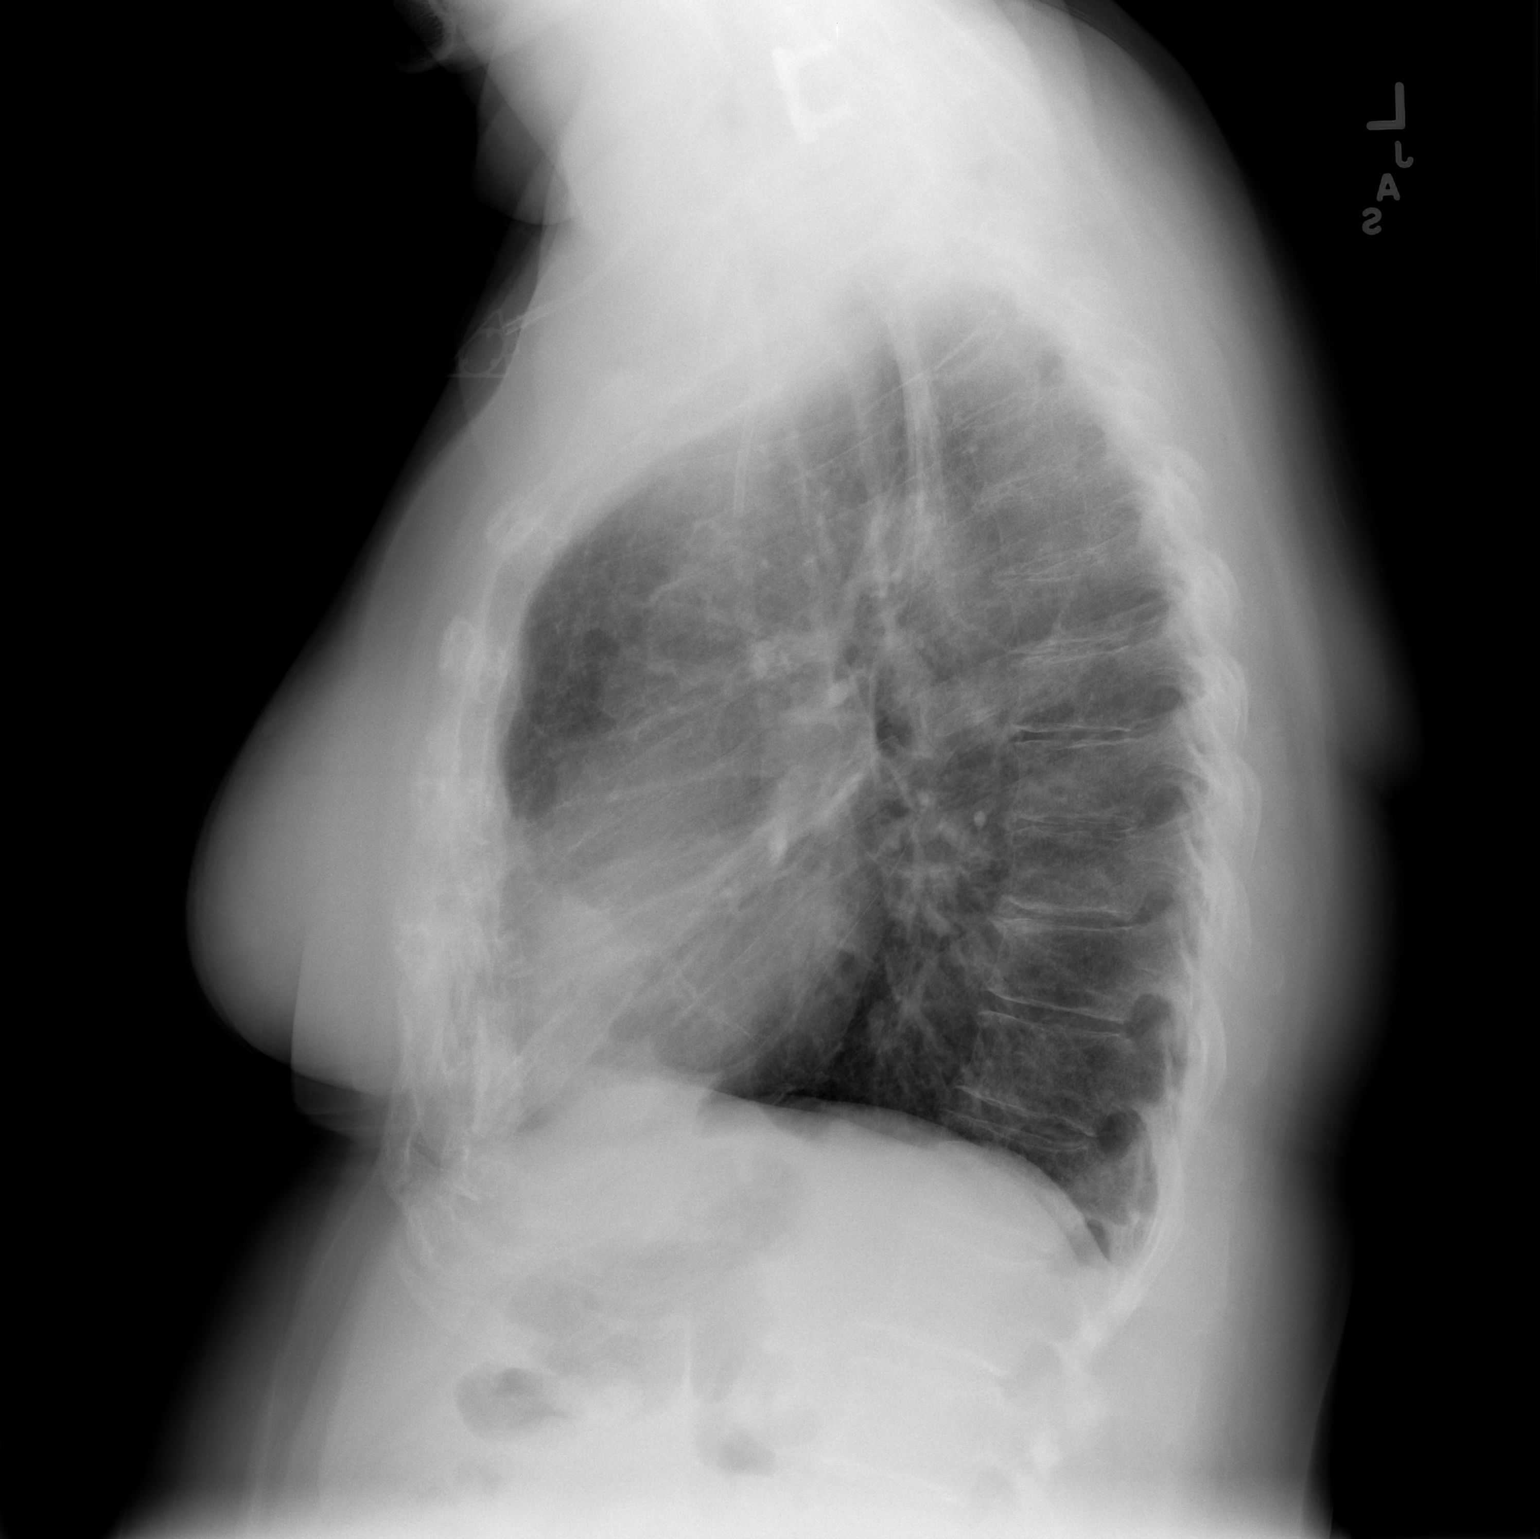

[2 of 2 positions shown; findings below may reference images not displayed]

FINDINGS: Right Port-A-Cath is unchanged in position.

Atherosclerotic calcification of the aortic arch noted along with
some mild scattered scarring in the lungs which appears relatively
similar to the prior CT scan distribution.

No pleural effusion or discrete airspace opacity is identified.
Mild thoracic spondylosis is present.
IMPRESSION: 1.  Scattered scarring in the lungs is stable.  No significant
change compared to recent prior exams.

## 2012-01-29 IMAGING — CT CT MAXILLOFACIAL W/O CM
3 series · 16 of 47 positions shown, 19 images · non-contrast
Comparison: None

CLINICAL DATA: Left sided facial swelling.

CT MAXILLOFACIAL WITHOUT CONTRAST
TECHNIQUE: Multidetector CT imaging of the maxillofacial
structures was performed. Multiplanar CT image reconstructions were
also generated.

[Series 3: orbit/facial 2.0 h30s · axial · 0.35mm/px · z∈[+1262,+1412]mm · 10 of 88 slices shown, 13 images]
[im 7/88  brain]
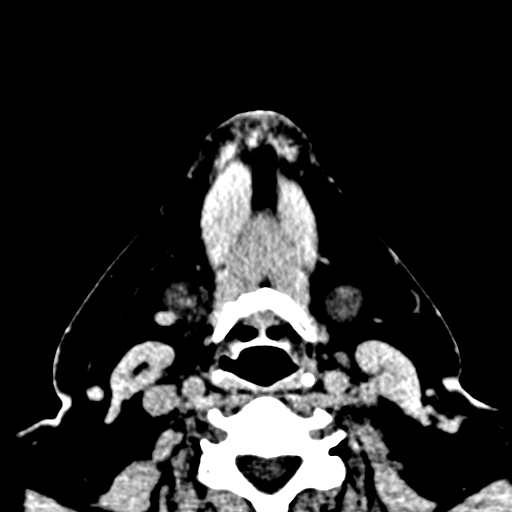
[im 7/88  bone]
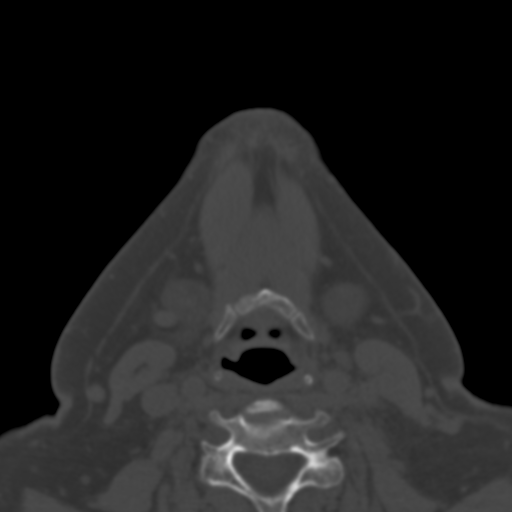
[im 16/88  bone]
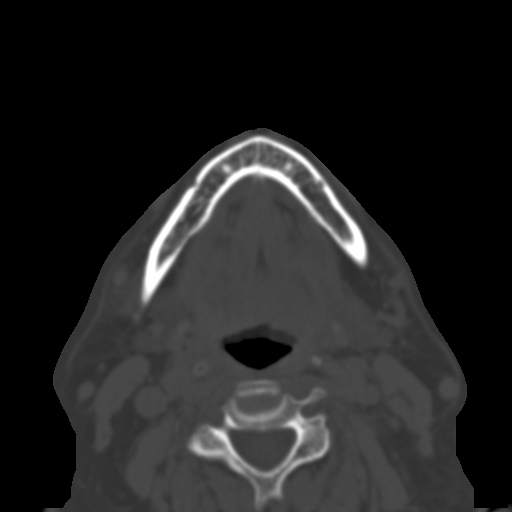
[im 25/88  bone]
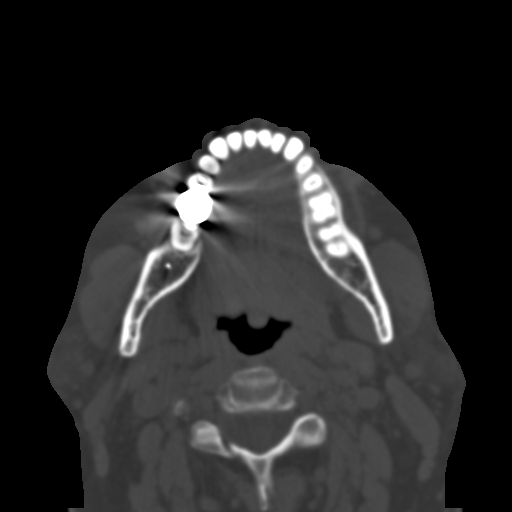
[im 31/88  bone]
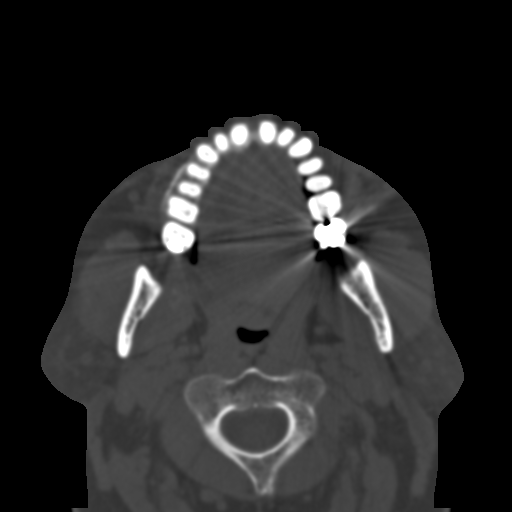
[im 40/88  brain]
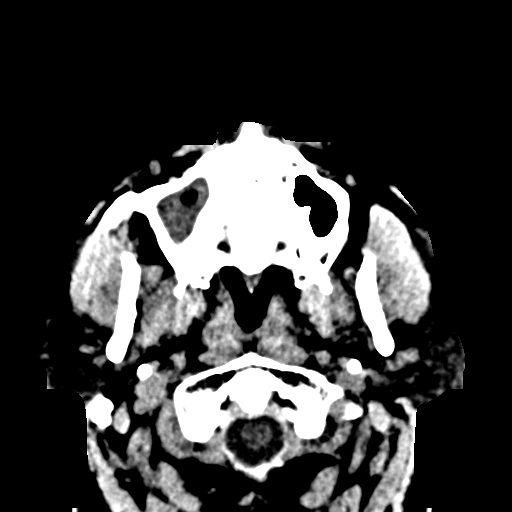
[im 40/88  bone]
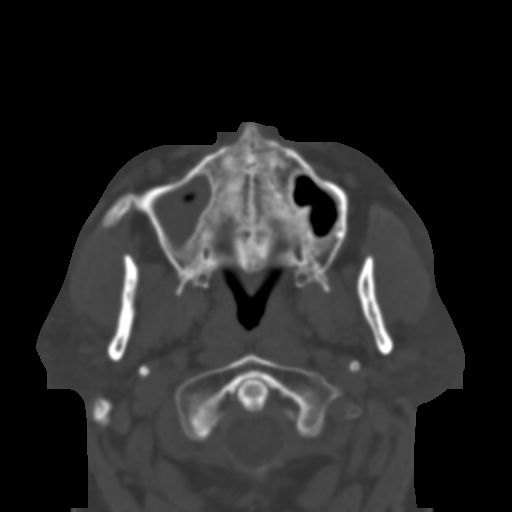
[im 49/88  bone]
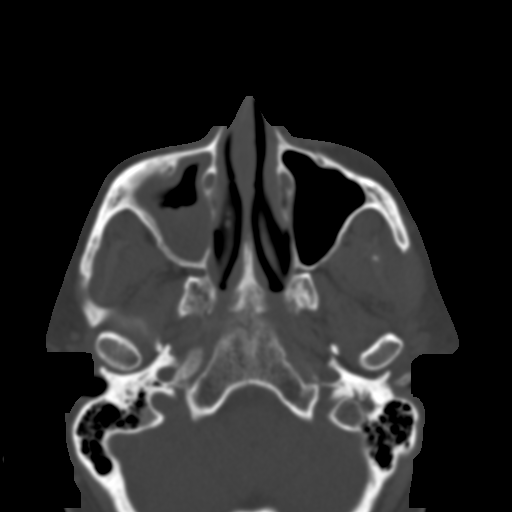
[im 58/88  bone]
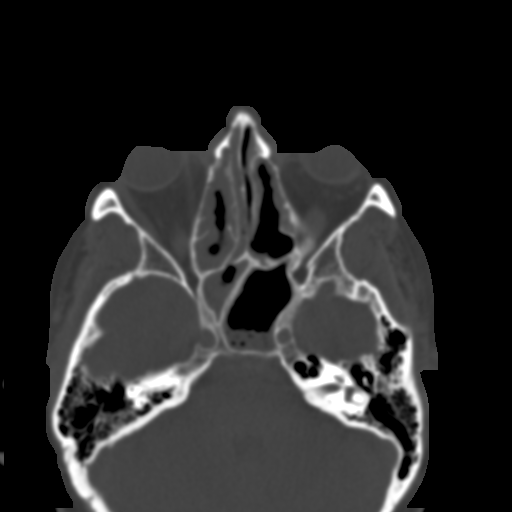
[im 67/88  bone]
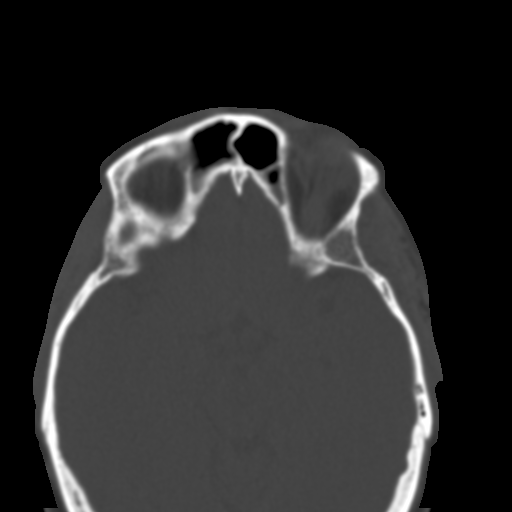
[im 73/88  brain]
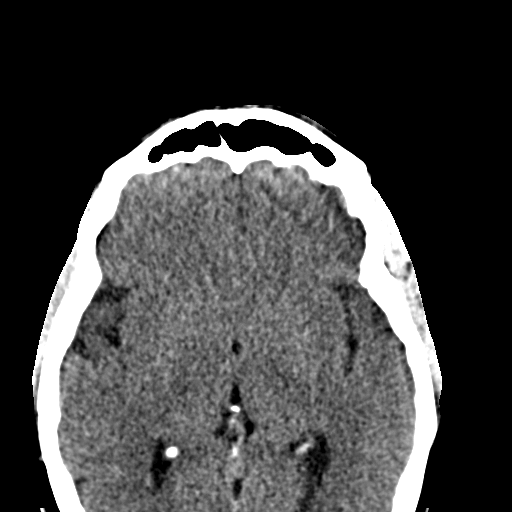
[im 73/88  bone]
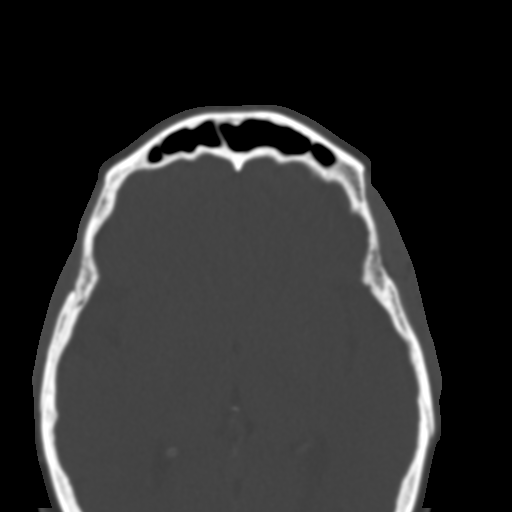
[im 82/88  bone]
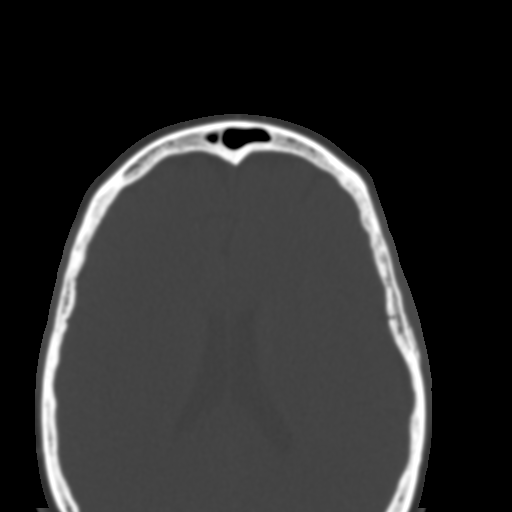

[Series 604: cor · coronal · 0.35mm/px · 3 of 71 slices shown]
[im 24/71  bone]
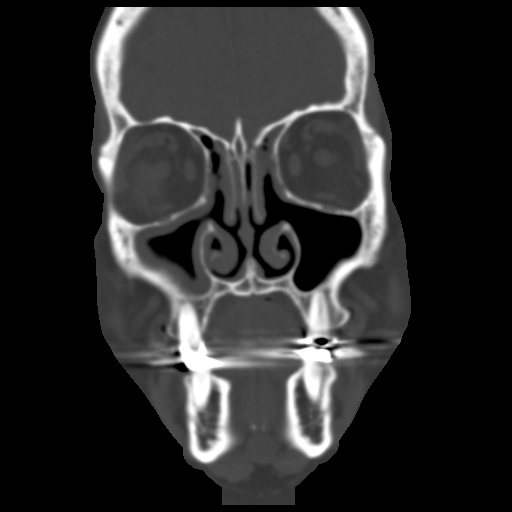
[im 32/71  bone]
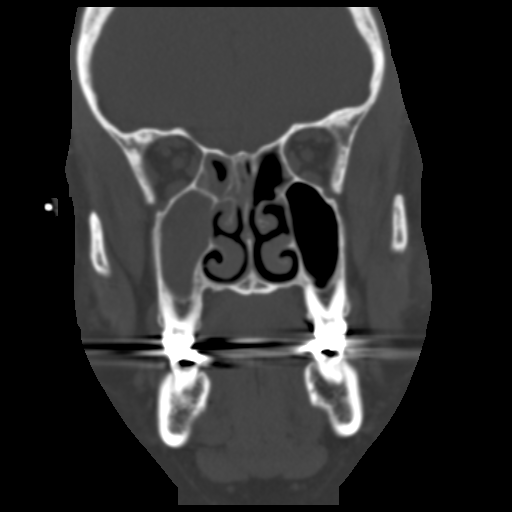
[im 39/71  bone]
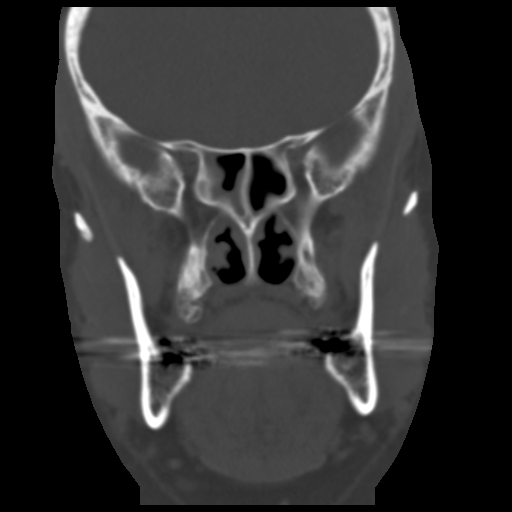

[Series 605: sag · sagittal · 0.35mm/px · 3 of 77 slices shown]
[im 26/77  bone]
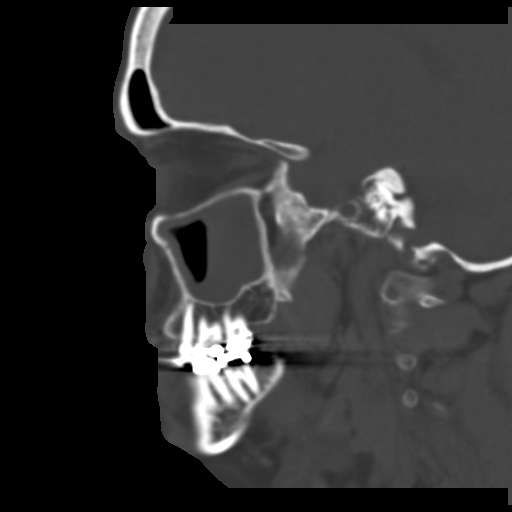
[im 39/77  bone]
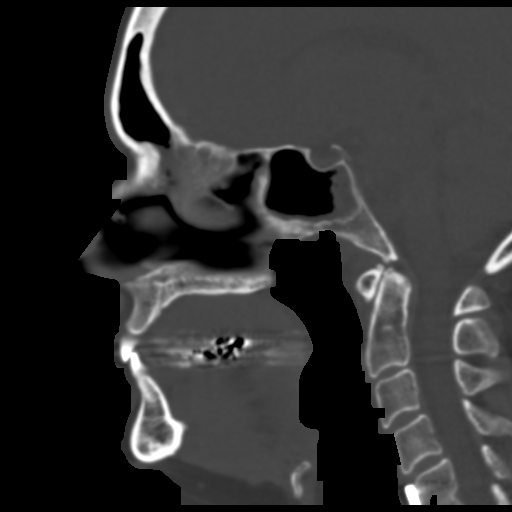
[im 51/77  bone]
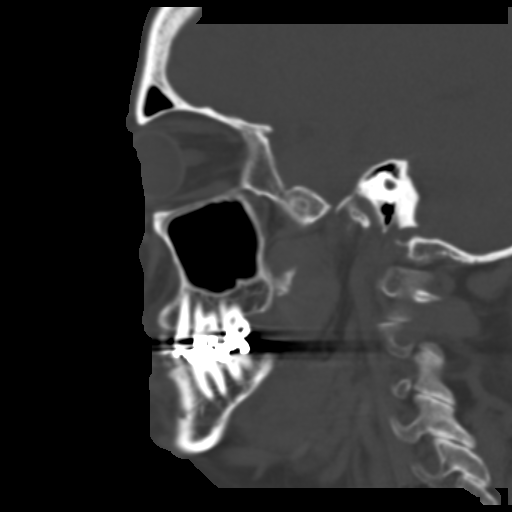

[16 of 47 positions shown; findings below may reference images not displayed]

FINDINGS: There is near complete opacification of the right
maxillary sinus along with extensive sphenoid and ethmoid sinus
disease.  The frontal sinuses are largely clear.  The mastoid air
cells and middle ear cavities are clear.

The parotid and submandibular glands appear symmetric and normal.
The tiny calcification is noted in the right submandibular gland.
No calculus is seen in the region of Roberto Tiger.

The facial bones are intact.  No fractures or destructive changes.
No findings for soft tissue mass or lymphadenopathy.  The
visualized portion of the brain appears normal.  The globes are
intact.

On the last two images there is asymmetric soft tissue density in
the region of the left piriform sinus.  Cannot exclude a mass.
Recommend clinical correlation with direct visualization.
IMPRESSION: 1.  Extensive paranasal sinus disease.
2.  Small calculus noted than the right submandibular gland.
3.  No acute bony findings and no adenopathy.
4.  Fullness in the left piriform sinus.  This is incompletely
evaluated.  Direct visualization is suggested.

## 2012-01-30 ENCOUNTER — Telehealth: Payer: Self-pay | Admitting: Critical Care Medicine

## 2012-01-30 NOTE — Telephone Encounter (Signed)
I spoke with the [name] and advised that Dr. Delford Field agrees she needs to go to ER. [name] states she will do so. Carron Curie, CMA

## 2012-01-30 NOTE — Telephone Encounter (Signed)
Last OV 01-25-12. Pt states over the last 2 days she has been having some increased SOB with rest and worse with activity. Pt states her cough is improved, but she is still having wheezing, chest tightness. Pt states that her oxygen levels have been running 85-87% at times. She states she does not have to be doing any activity for them to drop. She states they will return to the low 90's but have dropped several times to the 80's/ Pt is taking bactrim as prescribed. She also states she has been using her inhalers and albuterol neb med. She states the albuterol helps with her SOB for a little while but then the chest tightness and SOB returns. She states she is using her albuterol neb every 4 hours over the last few days. Pt is refusing to go to the hospital. I advised the pt that may be what is rec, but she insists that message be sent to Dr. Delford Field first.  Please advise. Carron Curie, CMA Allergies  Allergen Reactions  . Meperidine Hcl Anaphylaxis  . Demerol (Meperidine)   . Montelukast Sodium Hives and Rash

## 2012-01-30 NOTE — Telephone Encounter (Signed)
needs ED visit due to severity of asthma!!

## 2012-02-02 ENCOUNTER — Other Ambulatory Visit (HOSPITAL_COMMUNITY): Payer: Self-pay | Admitting: Family Medicine

## 2012-02-02 ENCOUNTER — Ambulatory Visit (HOSPITAL_COMMUNITY)
Admission: RE | Admit: 2012-02-02 | Discharge: 2012-02-02 | Disposition: A | Discharge status: Home / Self Care | Payer: BC Managed Care – PPO | Source: Ambulatory Visit | Attending: Family Medicine | Admitting: Family Medicine

## 2012-02-02 DIAGNOSIS — R509 Fever, unspecified: Secondary | ICD-10-CM | POA: Insufficient documentation

## 2012-02-02 DIAGNOSIS — R05 Cough: Secondary | ICD-10-CM | POA: Insufficient documentation

## 2012-02-02 DIAGNOSIS — R0602 Shortness of breath: Secondary | ICD-10-CM

## 2012-02-02 DIAGNOSIS — R059 Cough, unspecified: Secondary | ICD-10-CM | POA: Insufficient documentation

## 2012-02-14 ENCOUNTER — Encounter (HOSPITAL_COMMUNITY): Payer: Self-pay

## 2012-02-14 ENCOUNTER — Inpatient Hospital Stay (HOSPITAL_COMMUNITY): Payer: BC Managed Care – PPO

## 2012-02-14 ENCOUNTER — Encounter: Payer: Self-pay | Admitting: Critical Care Medicine

## 2012-02-14 ENCOUNTER — Inpatient Hospital Stay (HOSPITAL_COMMUNITY)
Admission: AD | Admit: 2012-02-14 | Discharge: 2012-02-16 | DRG: 586 | Disposition: A | Discharge status: Home / Self Care | Payer: BC Managed Care – PPO | Source: Ambulatory Visit | Attending: Critical Care Medicine | Admitting: Critical Care Medicine

## 2012-02-14 ENCOUNTER — Ambulatory Visit (INDEPENDENT_AMBULATORY_CARE_PROVIDER_SITE_OTHER): Payer: BC Managed Care – PPO | Admitting: Critical Care Medicine

## 2012-02-14 ENCOUNTER — Encounter (HOSPITAL_COMMUNITY): Payer: Self-pay | Admitting: Critical Care Medicine

## 2012-02-14 VITALS — BP 102/62 | HR 118 | Temp 100.0°F | Ht 62.0 in | Wt 171.8 lb

## 2012-02-14 DIAGNOSIS — J189 Pneumonia, unspecified organism: Secondary | ICD-10-CM | POA: Diagnosis present

## 2012-02-14 DIAGNOSIS — Y92009 Unspecified place in unspecified non-institutional (private) residence as the place of occurrence of the external cause: Secondary | ICD-10-CM

## 2012-02-14 DIAGNOSIS — J479 Bronchiectasis, uncomplicated: Secondary | ICD-10-CM | POA: Diagnosis present

## 2012-02-14 DIAGNOSIS — D684 Acquired coagulation factor deficiency: Secondary | ICD-10-CM

## 2012-02-14 DIAGNOSIS — Z79899 Other long term (current) drug therapy: Secondary | ICD-10-CM

## 2012-02-14 DIAGNOSIS — T17908A Unspecified foreign body in respiratory tract, part unspecified causing other injury, initial encounter: Secondary | ICD-10-CM | POA: Diagnosis present

## 2012-02-14 DIAGNOSIS — R791 Abnormal coagulation profile: Secondary | ICD-10-CM | POA: Diagnosis present

## 2012-02-14 DIAGNOSIS — K219 Gastro-esophageal reflux disease without esophagitis: Secondary | ICD-10-CM | POA: Diagnosis present

## 2012-02-14 DIAGNOSIS — R7309 Other abnormal glucose: Secondary | ICD-10-CM | POA: Diagnosis present

## 2012-02-14 DIAGNOSIS — T45515A Adverse effect of anticoagulants, initial encounter: Secondary | ICD-10-CM | POA: Diagnosis present

## 2012-02-14 DIAGNOSIS — J45909 Unspecified asthma, uncomplicated: Secondary | ICD-10-CM

## 2012-02-14 DIAGNOSIS — J329 Chronic sinusitis, unspecified: Secondary | ICD-10-CM | POA: Diagnosis present

## 2012-02-14 DIAGNOSIS — J4540 Moderate persistent asthma, uncomplicated: Secondary | ICD-10-CM | POA: Diagnosis present

## 2012-02-14 DIAGNOSIS — J019 Acute sinusitis, unspecified: Principal | ICD-10-CM | POA: Diagnosis present

## 2012-02-14 DIAGNOSIS — Z888 Allergy status to other drugs, medicaments and biological substances status: Secondary | ICD-10-CM

## 2012-02-14 DIAGNOSIS — C8589 Other specified types of non-Hodgkin lymphoma, extranodal and solid organ sites: Secondary | ICD-10-CM | POA: Diagnosis present

## 2012-02-14 DIAGNOSIS — E119 Type 2 diabetes mellitus without complications: Secondary | ICD-10-CM | POA: Diagnosis present

## 2012-02-14 DIAGNOSIS — J45901 Unspecified asthma with (acute) exacerbation: Secondary | ICD-10-CM | POA: Diagnosis present

## 2012-02-14 DIAGNOSIS — Z85828 Personal history of other malignant neoplasm of skin: Secondary | ICD-10-CM

## 2012-02-14 DIAGNOSIS — Z7901 Long term (current) use of anticoagulants: Secondary | ICD-10-CM

## 2012-02-14 DIAGNOSIS — IMO0002 Reserved for concepts with insufficient information to code with codable children: Secondary | ICD-10-CM | POA: Diagnosis present

## 2012-02-14 DIAGNOSIS — D801 Nonfamilial hypogammaglobulinemia: Secondary | ICD-10-CM | POA: Diagnosis present

## 2012-02-14 DIAGNOSIS — Z792 Long term (current) use of antibiotics: Secondary | ICD-10-CM

## 2012-02-14 HISTORY — DX: Nausea with vomiting, unspecified: R11.2

## 2012-02-14 HISTORY — DX: Other specified postprocedural states: Z98.890

## 2012-02-14 LAB — CBC WITH DIFFERENTIAL/PLATELET
Eosinophils Absolute: 0.1 10*3/uL (ref 0.0–0.7)
Eosinophils Relative: 0 % (ref 0–5)
Hemoglobin: 11.6 g/dL — ABNORMAL LOW (ref 12.0–15.0)
Lymphocytes Relative: 5 % — ABNORMAL LOW (ref 12–46)
Lymphs Abs: 0.9 10*3/uL (ref 0.7–4.0)
MCH: 30.3 pg (ref 26.0–34.0)
MCV: 92.4 fL (ref 78.0–100.0)
Monocytes Relative: 8 % (ref 3–12)
Neutrophils Relative %: 87 % — ABNORMAL HIGH (ref 43–77)
RBC: 3.83 MIL/uL — ABNORMAL LOW (ref 3.87–5.11)
WBC: 18.8 10*3/uL — ABNORMAL HIGH (ref 4.0–10.5)

## 2012-02-14 LAB — COMPREHENSIVE METABOLIC PANEL
AST: 14 U/L (ref 0–37)
Albumin: 2.9 g/dL — ABNORMAL LOW (ref 3.5–5.2)
BUN: 14 mg/dL (ref 6–23)
Calcium: 9.1 mg/dL (ref 8.4–10.5)
Creatinine, Ser: 1.11 mg/dL — ABNORMAL HIGH (ref 0.50–1.10)

## 2012-02-14 LAB — PROTIME-INR
INR: 4.39 — ABNORMAL HIGH (ref 0.00–1.49)
Prothrombin Time: 39.2 seconds — ABNORMAL HIGH (ref 11.6–15.2)

## 2012-02-14 LAB — GLUCOSE, CAPILLARY: Glucose-Capillary: 121 mg/dL — ABNORMAL HIGH (ref 70–99)

## 2012-02-14 LAB — HIV ANTIBODY (ROUTINE TESTING W REFLEX): HIV: NONREACTIVE

## 2012-02-14 LAB — STREP PNEUMONIAE URINARY ANTIGEN: Strep Pneumo Urinary Antigen: NEGATIVE

## 2012-02-14 MED ORDER — VANCOMYCIN HCL IN DEXTROSE 1-5 GM/200ML-% IV SOLN
1000.0000 mg | Freq: Once | INTRAVENOUS | Status: AC
  Administered 2012-02-14: 1000 mg via INTRAVENOUS
  Filled 2012-02-14: qty 200

## 2012-02-14 MED ORDER — ATORVASTATIN CALCIUM 40 MG PO TABS
40.0000 mg | ORAL_TABLET | Freq: Every day | ORAL | Status: DC
  Administered 2012-02-15: 40 mg via ORAL
  Filled 2012-02-14 (×2): qty 1

## 2012-02-14 MED ORDER — SODIUM CHLORIDE 0.9 % IV SOLN
INTRAVENOUS | Status: DC
  Administered 2012-02-14: 12:00:00 via INTRAVENOUS

## 2012-02-14 MED ORDER — WARFARIN - PHARMACIST DOSING INPATIENT
Freq: Every day | Status: DC
  Administered 2012-02-15: 18:00:00

## 2012-02-14 MED ORDER — LEVOFLOXACIN IN D5W 750 MG/150ML IV SOLN
750.0000 mg | INTRAVENOUS | Status: DC
  Administered 2012-02-14 – 2012-02-15 (×2): 750 mg via INTRAVENOUS
  Filled 2012-02-14 (×3): qty 150

## 2012-02-14 MED ORDER — INSULIN ASPART 100 UNIT/ML ~~LOC~~ SOLN
0.0000 [IU] | Freq: Three times a day (TID) | SUBCUTANEOUS | Status: DC
  Administered 2012-02-14: 2 [IU] via SUBCUTANEOUS
  Administered 2012-02-15: 3 [IU] via SUBCUTANEOUS
  Administered 2012-02-15 – 2012-02-16 (×2): 2 [IU] via SUBCUTANEOUS

## 2012-02-14 MED ORDER — ACYCLOVIR 400 MG PO TABS
400.0000 mg | ORAL_TABLET | Freq: Two times a day (BID) | ORAL | Status: DC
  Administered 2012-02-14 – 2012-02-16 (×4): 400 mg via ORAL
  Filled 2012-02-14 (×5): qty 1

## 2012-02-14 MED ORDER — VANCOMYCIN HCL IN DEXTROSE 1-5 GM/200ML-% IV SOLN
1000.0000 mg | Freq: Two times a day (BID) | INTRAVENOUS | Status: DC
  Administered 2012-02-15 – 2012-02-16 (×3): 1000 mg via INTRAVENOUS
  Filled 2012-02-14 (×4): qty 200

## 2012-02-14 MED ORDER — WARFARIN SODIUM 6 MG PO TABS: 6.0000 mg | ORAL_TABLET | Freq: Every day | ORAL | Status: DC

## 2012-02-14 MED ORDER — PREDNISONE 20 MG PO TABS
20.0000 mg | ORAL_TABLET | Freq: Every day | ORAL | Status: DC
  Administered 2012-02-14 – 2012-02-16 (×3): 20 mg via ORAL
  Filled 2012-02-14 (×4): qty 1

## 2012-02-14 MED ORDER — IOHEXOL 300 MG/ML  SOLN
100.0000 mL | Freq: Once | INTRAMUSCULAR | Status: AC | PRN
  Administered 2012-02-14: 80 mL via INTRAVENOUS

## 2012-02-14 MED ORDER — DEXTROSE 5 % IV SOLN
1.0000 g | Freq: Three times a day (TID) | INTRAVENOUS | Status: DC
  Administered 2012-02-14 – 2012-02-16 (×6): 1 g via INTRAVENOUS
  Filled 2012-02-14 (×8): qty 1

## 2012-02-14 MED ORDER — CITALOPRAM HYDROBROMIDE 20 MG PO TABS
20.0000 mg | ORAL_TABLET | Freq: Every day | ORAL | Status: DC
  Administered 2012-02-15 – 2012-02-16 (×2): 20 mg via ORAL
  Filled 2012-02-14 (×2): qty 1

## 2012-02-14 MED ORDER — ALBUTEROL SULFATE (5 MG/ML) 0.5% IN NEBU
2.5000 mg | INHALATION_SOLUTION | Freq: Four times a day (QID) | RESPIRATORY_TRACT | Status: DC
  Administered 2012-02-14 – 2012-02-16 (×8): 2.5 mg via RESPIRATORY_TRACT
  Filled 2012-02-14 (×9): qty 0.5

## 2012-02-14 MED ORDER — IPRATROPIUM BROMIDE 0.02 % IN SOLN
0.5000 mg | Freq: Four times a day (QID) | RESPIRATORY_TRACT | Status: DC
  Administered 2012-02-14 – 2012-02-16 (×8): 0.5 mg via RESPIRATORY_TRACT
  Filled 2012-02-14 (×9): qty 2.5

## 2012-02-14 NOTE — Care Management Note (Signed)
    Page 1 of 1   02/16/2012     2:06:20 PM   CARE MANAGEMENT NOTE 02/16/2012  Patient:  [name], [name]   Account Number:  [id number]  Date Initiated:  02/14/2012  Documentation initiated by:  Lanier Clam  Subjective/Objective Assessment:   ADMITTED W/COUGH,SOB,WEAKNESS.PNA.HX:NHL.     Action/Plan:   FROM HOME ALONE.HAS PCP,PHARMACY.   Anticipated DC Date:  02/16/2012   Anticipated DC Plan:  HOME/SELF CARE      DC Planning Services  CM consult      Choice offered to / List presented to:             Status of service:  Completed, signed off Medicare Important Message given?   (If response is "NO", the following Medicare IM given date fields will be blank) Date Medicare IM given:   Date Additional Medicare IM given:    Discharge Disposition:  HOME/SELF CARE  Per UR Regulation:  Reviewed for med. necessity/level of care/duration of stay  If discussed at Long Length of Stay Meetings, dates discussed:    Comments:  02/16/12 Ezel Vallone RN,BSN NCM [phone] D/C HOME NO ORDERS OR D/C NEEDS.  02/14/12 Ardell Aaronson RN,BSN NCM [phone]

## 2012-02-14 NOTE — Progress Notes (Signed)
ANTICOAGULATION CONSULT NOTE - Initial Consult  Pharmacy Consult for Warfarin Indication: Port a cath  Allergies  Allergen Reactions  . Meperidine Hcl Anaphylaxis  . Montelukast Sodium Hives and Rash    Patient Measurements: Height: 5' 1.81" (157 cm) (transcribed) Weight: 171 lb 11.8 oz (77.9 kg) (transcribed) IBW/kg (Calculated) : 49.67  Vital Signs: Temp: 99.4 F (37.4 C) (02/11 1110) Temp src: Oral (02/11 1110) BP: 123/67 mmHg (02/11 1110) Pulse Rate: 103 (02/11 1110)  Labs:  Recent Labs  02/14/12 1130 02/14/12 1205  HGB 11.6*  --   HCT 35.4*  --   PLT 235  --   LABPROT  --  39.2*  INR  --  4.39*  CREATININE 1.11*  --     Estimated Creatinine Clearance: 54.5 ml/min (by C-G formula based on Cr of 1.11).   Medical History: Past Medical History  Diagnosis Date  . Non Hodgkin's lymphoma   . Allergic rhinitis   . GERD (gastroesophageal reflux disease)   . Asthma   . Chronic sinusitis   . Respiratory failure   . Cavitary lung disease   . PONV (postoperative nausea and vomiting)     Medications:  Scheduled:  . acyclovir  400 mg Oral BID  . ipratropium  0.5 mg Nebulization Q6H   And  . albuterol  2.5 mg Nebulization Q6H  . [START ON 02/15/2012] atorvastatin  40 mg Oral q1800  . ceFEPime (MAXIPIME) IV  1 g Intravenous Q8H  . [START ON 02/15/2012] citalopram  20 mg Oral Daily  . insulin aspart  0-15 Units Subcutaneous TID WC  . levofloxacin (LEVAQUIN) IV  750 mg Intravenous Q24H  . predniSONE  20 mg Oral Q breakfast  . vancomycin  1,000 mg Intravenous Once  . [START ON 02/15/2012] vancomycin  1,000 mg Intravenous Q12H  . Warfarin - Pharmacist Dosing Inpatient   Does not apply q1800  . [DISCONTINUED] warfarin  6 mg Oral q1800   Infusions:  . sodium chloride 50 mL/hr at 02/14/12 1143    Assessment:  56 yof on chronic warfarin therapy for port a cath w/ hx of SVC syndrome  Warfarin dose PTA is 6mg  daily, except 7mg  on Sat/Sun. Last dose 2/10  INR  (4.39) supratherapeutic on admission  Noted potential drug interaction with Levaquin started 02/14/12  Goal of Therapy:  INR 2-3   Plan:  Hold warfarin today F/U daily INR and resume warfarin dosing when INR < 3   Lynann Beaver PharmD, BCPS Pager [phone] 02/14/2012 1:31 PM

## 2012-02-14 NOTE — Progress Notes (Addendum)
ANTIBIOTIC CONSULT NOTE - INITIAL  Pharmacy Consult for Vanco x8 days Indication: Suspected PNA  Allergies  Allergen Reactions  . Meperidine Hcl Anaphylaxis  . Demerol (Meperidine)   . Montelukast Sodium Hives and Rash    Patient Measurements:   02/14/12 = 78 kg, ht=157cm  Vital Signs: Temp: 99.4 F (37.4 C) (02/11 1110) Temp src: Oral (02/11 1110) BP: 123/67 mmHg (02/11 1110) Pulse Rate: 103 (02/11 1110) Intake/Output from previous day:   Intake/Output from this shift:    Labs: No results found for this basename: WBC, HGB, PLT, LABCREA, CREATININE,  in the last 72 hours The CrCl is unknown because both a height and weight (above a minimum accepted value) are required for this calculation. No results found for this basename: VANCOTROUGH, VANCOPEAK, VANCORANDOM, GENTTROUGH, GENTPEAK, GENTRANDOM, TOBRATROUGH, TOBRAPEAK, TOBRARND, AMIKACINPEAK, AMIKACINTROU, AMIKACIN,  in the last 72 hours   Microbiology: No results found for this or any previous visit (from the past 720 hour(s)).  Medical History: Past Medical History  Diagnosis Date  . Non Hodgkin's lymphoma   . Allergic rhinitis   . GERD (gastroesophageal reflux disease)   . Asthma   . Chronic sinusitis   . Respiratory failure   . Cavitary lung disease     Medications:  Anti-infectives   Start     Dose/Rate Route Frequency Ordered Stop   02/14/12 1400  ceFEPIme (MAXIPIME) 1 g in dextrose 5 % 50 mL IVPB     1 g 100 mL/hr over 30 Minutes Intravenous 3 times per day 02/14/12 1105 02/22/12 1359   02/14/12 1115  acyclovir (ZOVIRAX) tablet 400 mg     400 mg Oral 2 times daily 02/14/12 1105     02/14/12 1115  levofloxacin (LEVAQUIN) IVPB 750 mg     750 mg 100 mL/hr over 90 Minutes Intravenous Every 24 hours 02/14/12 1105 02/17/12 1114     Assessment: 57 yo F admitted 2/11 due to failure to thrive. Has persistent B/L infiltrated on CXR and prior psuedomonas infection (several Cx with pseudomonas in 2011 - all with  varying susceptibility patterns.) Hx of NHL. MD has ordered Vanco per Rx x8 days, plus cefepime and Levaquin per MD. Wt= 78kg, Waiting for CMET to be collected. For now, will order 1-time Vanco dose and f/u renal function.  Goal of Therapy:  Vancomycin trough level 15-20 mcg/ml  Plan:  1) Vanco 1g IV x1 2) Further Vanco orders once results of CMET available.  Darrol Angel, PharmD Pager: [phone] 02/14/2012,11:12 AM  ADDENDUM: SCr= 1.1, CrCl~ 68ml/min (64 normalized).  1) Vanco 1g IV q12h x8 days 2) Cefepime and Levaquin doses are appropriate.  Darrol Angel, PharmD Pager: [phone] 02/14/2012 1:02 PM

## 2012-02-14 NOTE — Progress Notes (Signed)
This pt was brought into the office for a f/u OV.  She needs admission for infection in lung.  See H and P to follow.  Luisa Hart WrightMD

## 2012-02-14 NOTE — H&P (Addendum)
PULMONARY  / CRITICAL CARE MEDICINE  Name: [name] MRN: [id number] DOB: 03-25-1955    ADMISSION DATE:  02/14/2012  CHIEF COMPLAINT:  Cough, dyspnea, weakness  BRIEF PATIENT DESCRIPTION: 57 yo F hx of NHL  Now with persistent bilateral infiltrates on CXR and prior pseudomonas disease and sinus disease.  Admitted d/t FTT  SIGNIFICANT EVENTS / STUDIES:  CT Chest /Sinus pending  LINES / TUBES: PIV  CULTURES: BCx 2 2/11 Sputum 2/11   ANTIBIOTICS: levaquin 2/11 Cefepime 2/11 vanco 2/11  HISTORY OF PRESENT ILLNESS:   57 yo WF with NHL prior hx of DTC asthma and chronic sinusitis.  Pt now with bilateral nodular infiltrates and prior pseudomonas lung infection that has failed to respond to po bactrim. Note INR now > 4.0   the patient notes fever, weakness, lassitude. The patient notes increasing cough and chest congestion. Patient notes increasing wheezing and shortness of breath. The patient is admitted for further inpatient care to include IV antibiotics. PAST MEDICAL HISTORY :  Past Medical History  Diagnosis Date  . Non Hodgkin's lymphoma   . Allergic rhinitis   . GERD (gastroesophageal reflux disease)   . Asthma   . Chronic sinusitis   . Respiratory failure   . Cavitary lung disease   . PONV (postoperative nausea and vomiting)    Past Surgical History  Procedure Laterality Date  . Vesicovaginal fistula closure w/ tah    . Nasal sinus surgery    . Neck surgery      x 2   . Breast surgery    . Portacath placement  7/11    Removed 6/12  . Basal cell carcinoma excision  03/2011    scalp  . Port-a-cath removal    . Peripherally inserted central catheter insertion    . Picc removal     Prior to Admission medications   Medication Sig Start Date End Date Taking? Authorizing Provider  ACCU-CHEK AVIVA PLUS test strip 1 each by Other route 2 (two) times a week.    Yes Historical Provider, MD  acyclovir (ZOVIRAX) 400 MG tablet Take 1 tablet twice daily 09/06/11  Yes Randall An, MD  albuterol (PROVENTIL) (2.5 MG/3ML) 0.083% nebulizer solution Take 2.5 mg by nebulization every 4 (four) hours as needed. 09/15/10  Yes Storm Frisk, MD  albuterol (VENTOLIN HFA) 108 (90 BASE) MCG/ACT inhaler Inhale 2 puffs into the lungs every 6 (six) hours as needed. 03/28/11  Yes Storm Frisk, MD  benzonatate (TESSALON) 200 MG capsule Take 1 capsule by mouth Three times daily as needed. 05/11/11  Yes Historical Provider, MD  citalopram (CELEXA) 20 MG tablet Take 20 mg by mouth every morning.     Yes Historical Provider, MD  furosemide (LASIX) 20 MG tablet Take 1 tablet by mouth every other day as needed. *Usually takes as needed for fluid retention upon weighing in**   Yes Historical Provider, MD  Lancets (ACCU-CHEK MULTICLIX) lancets 1 each by Other route 2 (two) times a week.    Yes Historical Provider, MD  Mometasone Furo-Formoterol Fum 200-5 MCG/ACT AERO Inhale 2 Inhalers into the lungs 2 (two) times daily. BRAND NAME: DULERA 09/15/10  Yes Storm Frisk, MD  omeprazole (PRILOSEC) 20 MG capsule Take 1 capsule (20 mg total) by mouth 2 (two) times daily. 09/15/10  Yes Storm Frisk, MD  potassium chloride 40 MEQ/15ML (20%) LIQD ON HOLD   Yes Historical Provider, MD  predniSONE (DELTASONE) 10 MG tablet Take 5 mg  by mouth daily. 01/25/12  Yes Storm Frisk, MD  simvastatin (ZOCOR) 80 MG tablet Take 80 mg by mouth at bedtime.     Yes Historical Provider, MD  warfarin (COUMADIN) 1 MG tablet Take 1 mg by mouth as directed.    Yes Historical Provider, MD  warfarin (COUMADIN) 10 MG tablet Take 5 mg by mouth as directed.    Yes Historical Provider, MD  zafirlukast (ACCOLATE) 20 MG tablet Take 1 tablet (20 mg total) by mouth 2 (two) times daily. 01/11/11  Yes Storm Frisk, MD   Allergies  Allergen Reactions  . Meperidine Hcl Anaphylaxis  . Montelukast Sodium Hives and Rash    FAMILY HISTORY:  Family History  Problem Relation Age of Onset  . Emphysema Mother   .  Allergies Father   . Asthma Father     as a child  . Leukemia Maternal Grandmother   . Diabetes Brother   . Stroke Mother    SOCIAL HISTORY:  reports that she has never smoked. She has never used smokeless tobacco. She reports that she does not drink alcohol or use illicit drugs.  REVIEW OF SYSTEMS:   Constitutional: +++ for fever, chills,  No weight loss,+++ malaise/fatigue and +++diaphoresis.  HENT: Negative for hearing loss, ear pain, nosebleeds, congestion, sore throat, neck pain, tinnitus and ear discharge.   Eyes: Negative for blurred vision, double vision, photophobia, pain, discharge and redness.  Respiratory:+++ for cough, no hemoptysis, +++sputum production, +++shortness of breath, +++wheezing and no stridor.   Cardiovascular: +++ for chest pain, no palpitations, orthopnea, claudication, leg swelling and PND.  Gastrointestinal: Negative for heartburn, nausea, vomiting, abdominal pain, diarrhea, constipation, blood in stool and melena.  Genitourinary: Negative for dysuria, urgency, frequency, hematuria and flank pain.  Musculoskeletal: Negative for myalgias, back pain, joint pain and falls.  Skin: Negative for itching and rash.  Neurological: Negative for dizziness, tingling, tremors, sensory change, speech change, focal weakness, seizures, loss of consciousness, weakness and headaches.  Endo/Heme/Allergies: Negative for environmental allergies and polydipsia. +++ bruise/bleed easily.  SUBJECTIVE:   VITAL SIGNS: Temp:  [98.8 F (37.1 C)-100 F (37.8 C)] 98.8 F (37.1 C) (02/11 1357) Pulse Rate:  [100-118] 100 (02/11 1357) Resp:  [20] 20 (02/11 1357) BP: (102-125)/(62-67) 125/62 mmHg (02/11 1357) SpO2:  [92 %-94 %] 92 % (02/11 1357) Weight:  [77.9 kg (171 lb 11.8 oz)-77.928 kg (171 lb 12.8 oz)] 77.9 kg (171 lb 11.8 oz) (02/11 1153)  PHYSICAL EXAMINATION: General:  Ill appearing in NAD Neuro:  No focal deficits HEENT:  Dry mucus membranes Neck:  Supple, no tmg, no  lan Cardiovascular:  Tachy nl s1 s2 no s3 s4 Lungs:  Rhonchi, poor airflow Abdomen:  Soft NT bsa Musculoskeletal:  From, no joint issues Skin:  clear   Recent Labs Lab 02/14/12 1130  NA 131*  K 4.3  CL 94*  CO2 27  BUN 14  CREATININE 1.11*  GLUCOSE 92    Recent Labs Lab 02/14/12 1130  HGB 11.6*  HCT 35.4*  WBC 18.8*  PLT 235   CT scan of the chest and CT sinuses are pending at the time of this dictation   ASSESSMENT / PLAN:  Principal Problem:   Pneumonia Active Problems:   Acute sinusitis   Severe persistent asthma with chronic sinusitis   GERD   DIABETES MELLITUS, BORDERLINE   HYPOGAMMAGLOBULINEMIA   1. PNA bilateral, chronic with nodular infiltrates.  ?pseudomonas  2. Chronic sinusitis  And acute sinusitis  3. Hx  of Asthma DTC  4. NHL hx  5. Border line DM  Plan  Admit for ABX to include vancomycin, Levaquin, cefepime CT sinus/chest Prob FOB  once INR less than 2 IV fluid hydration Oral prednisone Bronchodilator therapy Flutter  valve  Caryl Bis  [phone]  Cell  [phone]  If no response or cell goes to voicemail, call beeper [phone]  Pulmonary and Critical Care Medicine [facility]   02/14/2012, 3:50 PM

## 2012-02-15 DIAGNOSIS — T45515A Adverse effect of anticoagulants, initial encounter: Secondary | ICD-10-CM

## 2012-02-15 DIAGNOSIS — D684 Acquired coagulation factor deficiency: Secondary | ICD-10-CM

## 2012-02-15 DIAGNOSIS — D801 Nonfamilial hypogammaglobulinemia: Secondary | ICD-10-CM

## 2012-02-15 LAB — BASIC METABOLIC PANEL
BUN: 17 mg/dL (ref 6–23)
Calcium: 9.2 mg/dL (ref 8.4–10.5)
Creatinine, Ser: 1.08 mg/dL (ref 0.50–1.10)
GFR calc Af Amer: 65 mL/min — ABNORMAL LOW (ref >90)
GFR calc non Af Amer: 56 mL/min — ABNORMAL LOW (ref >90)
Potassium: 3.8 mEq/L (ref 3.5–5.1)

## 2012-02-15 LAB — LEGIONELLA ANTIGEN, URINE: Legionella Antigen, Urine: NEGATIVE

## 2012-02-15 LAB — CBC
Hemoglobin: 10.9 g/dL — ABNORMAL LOW (ref 12.0–15.0)
MCHC: 31.6 g/dL (ref 30.0–36.0)
Platelets: 247 10*3/uL (ref 150–400)
RDW: 15.4 % (ref 11.5–15.5)

## 2012-02-15 LAB — GLUCOSE, CAPILLARY
Glucose-Capillary: 158 mg/dL — ABNORMAL HIGH (ref 70–99)
Glucose-Capillary: 104 mg/dL — ABNORMAL HIGH (ref 70–99)
Glucose-Capillary: 101 mg/dL — ABNORMAL HIGH (ref 70–99)
Glucose-Capillary: 141 mg/dL — ABNORMAL HIGH (ref 70–99)

## 2012-02-15 LAB — PROTIME-INR: INR: 3.51 — ABNORMAL HIGH (ref 0.00–1.49)

## 2012-02-15 LAB — IGG, IGA, IGM
IgA: 104 mg/dL (ref 69–380)
IgG (Immunoglobin G), Serum: 484 mg/dL — ABNORMAL LOW (ref 690–1700)

## 2012-02-15 MED ORDER — IMMUNE GLOBULIN (HUMAN) 10 GM/200ML IV SOLN
400.0000 mg/kg | Freq: Once | INTRAVENOUS | Status: AC
  Administered 2012-02-15: 30 g via INTRAVENOUS
  Filled 2012-02-15: qty 600

## 2012-02-15 NOTE — Progress Notes (Signed)
  Pharmacy Note (Brief) - Warfarin  56 yof on chronic warfarin therapy for port a cath w/ hx of SVC syndrome. INR trending down but still supratherapeutic (4.39-->3.51). No bleeding reported in chart.  1) Continue to hold warfarin 2) Plan to resume warfarin once Daleen Bo, PharmD Pager: [phone] 02/15/2012 10:42 AM

## 2012-02-15 NOTE — Progress Notes (Signed)
Per MD order- RT issued PT a flutter device- PT demonstrated verbal and hands on understanding of device.

## 2012-02-15 NOTE — Progress Notes (Addendum)
  PULMONARY  / CRITICAL CARE MEDICINE  Name: [name] MRN: [id number] DOB: 1955/10/22    ADMISSION DATE:  02/14/2012  CHIEF COMPLAINT:  Cough, dyspnea, weakness  BRIEF PATIENT DESCRIPTION: 57 yo F hx of NHL  Now with persistent bilateral infiltrates on CXR and prior pseudomonas disease and sinus disease.  Admitted d/t FTT  SIGNIFICANT EVENTS / STUDIES:  CT Chest /Sinus pending  LINES / TUBES: PIV  CULTURES: BCx 2 2/11 Sputum 2/11   ANTIBIOTICS: levaquin 2/11 Cefepime 2/11 vanco 2/11  HISTORY OF PRESENT ILLNESS:   57 yo WF with NHL prior hx of DTC asthma and chronic sinusitis.  Pt now with bilateral nodular infiltrates and prior pseudomonas lung infection that has failed to respond to po bactrim. Note INR now > 4.0   the patient notes fever, weakness, lassitude. The patient notes increasing cough and chest congestion. Patient notes increasing wheezing and shortness of breath. The patient is admitted for further inpatient care to include IV antibiotics.  Subjective:  Pt feels better, less cough and dyspnea.   VITAL SIGNS: Temp:  [98.4 F (36.9 C)] 98.4 F (36.9 C) (02/12 0414) Pulse Rate:  [84-99] 99 (02/12 0414) Resp:  [20] 20 (02/12 0414) BP: (90-111)/(57-63) 111/63 mmHg (02/12 0414) SpO2:  [88 %-97 %] 88 % (02/12 0815)  PHYSICAL EXAMINATION: General:  Ill appearing in NAD Neuro:  No focal deficits HEENT:  Dry mucus membranes Neck:  Supple, no tmg, no lan Cardiovascular:  Tachy nl s1 s2 no s3 s4 Lungs:  Rhonchi, poor airflow Abdomen:  Soft NT bsa Musculoskeletal:  From, no joint issues Skin:  clear   Recent Labs Lab 02/14/12 1130 02/15/12 0435  NA 131* 134*  K 4.3 3.8  CL 94* 96  CO2 27 27  BUN 14 17  CREATININE 1.11* 1.08  GLUCOSE 92 131*    Recent Labs Lab 02/14/12 1130 02/15/12 0435  HGB 11.6* 10.9*  HCT 35.4* 34.5*  WBC 18.8* 10.3  PLT 235 247   CT scan of the chest and CT sinuses show bronchiectasis, no PNA,  Severe  sinusitis   ASSESSMENT / PLAN:  Principal Problem:   Acute sinusitis Active Problems:   Severe persistent asthma with chronic sinusitis   GERD   DIABETES MELLITUS, BORDERLINE   HYPOGAMMAGLOBULINEMIA   Pneumonia   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 Hypogammaglobulinemia  4. NHL hx  5. Border line DM  6. Hypocoaguable d/t coumadin  Plan  Give 400mg /kg IV gammaglobulin infusion Cont levaquin/vanco/cefepime F/u c/s data BDs Flutter Sinus rinse No FOB needed Hold coumadin Caryl Bis  [phone]  Cell  [phone]  If no response or cell goes to voicemail, call beeper [phone]  Pulmonary and Critical Care Medicine [facility]   02/15/2012, 2:08 PM

## 2012-02-15 NOTE — Progress Notes (Signed)
IVIG dosing: request for pharmacy assistance in dosing.  MD requested 400mg /kg = 30 gm  One dose ordered 2/12, please order subsequent doses if necessary.  Thank you, Otho Bellows PharmD 02/15/2012 3:57 PM

## 2012-02-16 ENCOUNTER — Telehealth: Payer: Self-pay | Admitting: Critical Care Medicine

## 2012-02-16 LAB — CBC
HCT: 33.3 % — ABNORMAL LOW (ref 36.0–46.0)
Hemoglobin: 10.6 g/dL — ABNORMAL LOW (ref 12.0–15.0)
MCH: 29.2 pg (ref 26.0–34.0)
MCHC: 31.8 g/dL (ref 30.0–36.0)
RBC: 3.63 MIL/uL — ABNORMAL LOW (ref 3.87–5.11)

## 2012-02-16 LAB — BASIC METABOLIC PANEL
BUN: 17 mg/dL (ref 6–23)
CO2: 25 mEq/L (ref 19–32)
Calcium: 9.1 mg/dL (ref 8.4–10.5)
GFR calc non Af Amer: 64 mL/min — ABNORMAL LOW (ref >90)
Glucose, Bld: 115 mg/dL — ABNORMAL HIGH (ref 70–99)

## 2012-02-16 LAB — PROTIME-INR
INR: 2.71 — ABNORMAL HIGH (ref 0.00–1.49)
Prothrombin Time: 27.4 seconds — ABNORMAL HIGH (ref 11.6–15.2)

## 2012-02-16 MED ORDER — LEVOFLOXACIN 750 MG PO TABS: 750.0000 mg | ORAL_TABLET | Freq: Every day | ORAL | Status: DC

## 2012-02-16 MED ORDER — LEVOFLOXACIN IN D5W 750 MG/150ML IV SOLN
750.0000 mg | INTRAVENOUS | Status: DC
  Administered 2012-02-16: 750 mg via INTRAVENOUS
  Filled 2012-02-16: qty 150

## 2012-02-16 MED ORDER — WARFARIN SODIUM 4 MG PO TABS
4.0000 mg | ORAL_TABLET | Freq: Once | ORAL | Status: DC
  Filled 2012-02-16: qty 1

## 2012-02-16 MED ORDER — FLUTICASONE PROPIONATE 50 MCG/ACT NA SUSP: 2.0000 | Freq: Two times a day (BID) | NASAL | Status: DC

## 2012-02-16 MED ORDER — PREDNISONE 20 MG PO TABS: ORAL_TABLET | ORAL | Status: DC

## 2012-02-16 MED ORDER — SALINE SPRAY 0.65 % NA SOLN
1.0000 | Freq: Three times a day (TID) | NASAL | Status: DC
  Administered 2012-02-16: 1 via NASAL
  Filled 2012-02-16: qty 44

## 2012-02-16 MED ORDER — FLUTICASONE PROPIONATE 50 MCG/ACT NA SUSP
1.0000 | Freq: Two times a day (BID) | NASAL | Status: DC
  Administered 2012-02-16: 1 via NASAL
  Filled 2012-02-16: qty 16

## 2012-02-16 NOTE — Discharge Summary (Signed)
Physician Discharge Summary     Patient ID: [name] MRN: [id number] DOB/AGE: May 24, 1955 57 y.o.  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 Admitted d/t FTT  SIGNIFICANT EVENTS / STUDIES:  CT Chest /Sinus  Revealed severe sinusitis, small RUL PNA, bronchiectasis and mucus plugging.  LINES / TUBES:  PIV  CULTURES:  BCx 2 2/11 >>NGTD Sputum 2/11 >>NGTD ANTIBIOTICS:  levaquin 2/11  Cefepime 2/11  vanco 2/11  HISTORY OF PRESENT ILLNESS:  57 yo WF with NHL prior hx of DTC asthma and chronic sinusitis. Pt now with bilateral nodular infiltrates and prior pseudomonas lung infection that has failed to respond to po bactrim. Note INR now > 4.0 the patient notes fever, weakness, lassitude. The patient notes increasing cough and chest congestion. Patient notes increasing wheezing and shortness of breath. The patient is admitted for further inpatient care to include IV antibiotics.     Hospital Course:  Pt adm and given three IV ABX.  CT sinus showed severe sinusitis. Pt improved with nasal hygiene and IVABX.  Cultures pending.   Plan Home po Levaquin x 14 days.   Pt received gamma globulin injection for low IgG levels.  Discharge Exam: BP 105/59  Pulse 79   Temp(Src) 98 F (36.7 C) (Oral)  Resp 18  Ht 5' 1.81" (1.57 m)  Wt 77.9 kg (171 lb 11.8 oz)  BMI 31.6 kg/m2  SpO2 91%  Chest: improved BS, decreased wheezes Cor RRR nl s1 s2 ABD: benign Ext clear   Labs at discharge Lab Results  Component Value Date   CREATININE 0.97 02/16/2012   BUN 17 02/16/2012   NA 134* 02/16/2012   K 3.8 02/16/2012   CL 99 02/16/2012   CO2 25 02/16/2012   Lab Results  Component Value Date   WBC 10.3 02/16/2012   HGB 10.6* 02/16/2012   HCT 33.3* 02/16/2012   MCV 91.7 02/16/2012   PLT 258 02/16/2012   Lab Results  Component Value Date   ALT 21 02/14/2012   AST 14 02/14/2012   ALKPHOS 96 02/14/2012   BILITOT 1.0 02/14/2012   Lab Results  Component Value Date   INR 2.71* 02/16/2012   INR 3.51* 02/15/2012   INR 4.39* 02/14/2012    Current radiology studies Ct Chest W Contrast  02/14/2012  *RADIOLOGY REPORT*  Clinical Data: Right upper lobe pulmonary nodule.  CT CHEST WITH CONTRAST  Technique:  Multidetector CT imaging of the chest was performed following the standard protocol during bolus administration of intravenous contrast.  Contrast: 80mL OMNIPAQUE IOHEXOL 300 MG/ML  SOLN  Comparison: Multiple exams, including 01/23/2012 and 01/17/2012  Findings: Small right paratracheal and AP window lymph nodes noted, not pathologically enlarged by size criteria.  Venous collaterals noted in the subcutaneous tissues, with contrast primarily extending through these collateral vessels suggesting right subclavian vein  or SVC stenosis.  A small hiatal hernia is present.  Biapical pleuroparenchymal scarring noted.  The previously hypermetabolic lesion of concern anteriorly in the right upper lobe has now completely resolved. A separate 0.9 x 0.5 cm ground-glass opacity nodule is observed in the right upper lobe on image 14 of series 5, not apparent on the PET CT and only very faintly apparent on the CT scan from 01/23/2012.  There is scattered scarring anteriorly in both upper lobes  along with some airway thickening and tree in bud opacities posteriorly in the right upper lobe favoring atypical infectious bronchiolitis. Airway thickening and airway plugging noted in both lower lobes with cylindrical bronchiectasis and faint nodularity which appears inflammatory and which has increased compared to the prior exam.  Continued atelectasis noted in the lingula.  Thoracic spondylosis noted.  IMPRESSION:  1.  The previously hypermetabolic right upper lobe nodule has resolved.  Accordingly, and less specific treatment for malignancy in this vicinity was administered, this was likely an inflammatory lesion. 2.  There was airway thickening with lower lobe cylindrical bronchiectasis and airway plugging, along with scattered faint inflammatory appearing nodularity in the lungs. 3.  Suspected stenotic right subclavian vein or SVC, with multiple collaterals observed. 4.  Small hiatal hernia.   Original Report Authenticated By: Gaylyn Rong, M.D.    Ct Maxillofacial  Ltd Wo Cm  02/14/2012  *RADIOLOGY REPORT*  Clinical Data:  Facial congestion with bloody mucous  CT PARANASAL SINUS LIMITED WITHOUT CONTRAST  Technique:  Multidetector CT images of the paranasal sinuses were obtained in a single plane without contrast.  Comparison:  11/30/2010  Findings:  Bilateral maxillary antral surgery has been performed. Air-fluid levels are seen in both maxillary sinuses, greater on the right.  Chronic sclerosis of the maxillary sinus walls.  Bilateral ethmoidectomies, with considerable mucosal thickening throughout both ethmoid cavities.  Significant mucosal thickening and fluid accumulation in the sphenoid sinus with air fluid levels, suggesting acuity.  Frontal sinus are grossly clear.  No osseous destructive lesion.  Visualized intracranial compartment unremarkable.  IMPRESSION: Significant acute and chronic sinus disease in the maxillary sinuses despite antral windows.  Significant bilateral sphenoid sinus  disease both acute and chronic.  Significant mucosal thickening persists despite bilateral ethmoidectomies.   Original Report Authenticated By: Davonna Belling, M.D.     Disposition:  01-Home or Self Care  Discharge Orders   Future Appointments Provider Department Dept Phone   07/17/2012 11:30 AM Ellouise Newer, [facility] [phone]   Future Orders Complete By Expires     Discharge patient  As directed         Medication List    STOP taking these medications       potassium chloride 40 MEQ/15ML (20%) Liqd      TAKE these medications       ACCU-CHEK AVIVA PLUS test strip  Generic drug:  glucose blood  1 each by Other route 2 (two) times a week.     accu-chek multiclix lancets  1 each by Other route 2 (two) times a week.     acetaminophen 500 MG tablet  Commonly known as:  TYLENOL  Take 500 mg by mouth every 6 (six) hours as needed for pain or fever.     acyclovir 400 MG tablet  Commonly known as:  ZOVIRAX  Take 1 tablet twice daily     albuterol (2.5 MG/3ML) 0.083% nebulizer solution  Commonly known as:  PROVENTIL  Take 2.5 mg by nebulization  every 4 (four) hours as needed for wheezing or shortness of breath.     albuterol 108 (90 BASE) MCG/ACT inhaler  Commonly known as:  PROVENTIL HFA;VENTOLIN HFA  Inhale 2 puffs into the lungs every 6 (six) hours as needed for wheezing or shortness of breath.     citalopram 20 MG tablet  Commonly known as:  CELEXA  Take 20 mg by mouth every morning.     fluticasone 50 MCG/ACT nasal spray  Commonly known as:  FLONASE  Place 2 sprays into the nose 2 (two) times daily.     furosemide 20 MG tablet  Commonly known as:  LASIX  Take 1 tablet by mouth every other day as needed. *Usually takes as needed for fluid retention upon weighing in**     levofloxacin 750 MG tablet  Commonly known as:  LEVAQUIN  Take 1 tablet (750 mg total) by mouth daily.     mometasone-formoterol 200-5 MCG/ACT Aero  Commonly known as:   DULERA  Inhale 2 puffs into the lungs 2 (two) times daily.     omeprazole 20 MG capsule  Commonly known as:  PRILOSEC  Take 1 capsule (20 mg total) by mouth 2 (two) times daily.     predniSONE 20 MG tablet  Commonly known as:  DELTASONE  Take 2 daily for 7days then 1 daily     simvastatin 80 MG tablet  Commonly known as:  ZOCOR  Take 80 mg by mouth at bedtime.     TESSALON 200 MG capsule  Generic drug:  benzonatate  Take 200 mg by mouth 3 (three) times daily as needed for cough.     warfarin 10 MG tablet  Commonly known as:  COUMADIN  Take 5 mg by mouth every evening. She takes with a 1 mg tablet to equal her dose of 6mg  everyday except for on Saturday and Sunday. She takes two 1mg  tablets to equal her dose of 7 mg on those two days.     warfarin 1 MG tablet  Commonly known as:  COUMADIN  Take 1-2 mg by mouth every evening. She takes one tablet with half of a 10mg  tablet to equal her 6mg  dose everyday except for on Saturday and Sunday. She takes two tablets with half of a 10mg  tablet to equal her dose of 7 mg on those two days.     zafirlukast 20 MG tablet  Commonly known as:  ACCOLATE  Take 1 tablet (20 mg total) by mouth 2 (two) times daily.           Follow-up Information   Follow up with Shan Levans, MD. Schedule an appointment as soon as possible for a visit in 1 week.   Contact information:   [address] [place] [address] [phone]       Discharged Condition: good  Physician Statement:   The Patient was personally examined, the discharge assessment and plan has been personally reviewed and I agree with ACNP Babcock's assessment and plan. > 30 minutes of time have been dedicated to discharge assessment, planning and discharge instructions.   Signed: [name] Beeper  [phone]  Cell  [phone]  If no response or cell goes to voicemail, call beeper [phone]  02/16/2012, 12:55 PM

## 2012-02-16 NOTE — Progress Notes (Signed)
ANTICOAGULATION CONSULT NOTE - Initial Consult  Pharmacy Consult for Coumadin Indication: portacath w/hx SVC syndrom  Allergies  Allergen Reactions  . Meperidine Hcl Anaphylaxis  . Montelukast Sodium Hives and Rash    Patient Measurements: Height: 5' 1.81" (157 cm) (transcribed) Weight: 171 lb 11.8 oz (77.9 kg) (transcribed) IBW/kg (Calculated) : 49.67  Vital Signs: Temp: 98 F (36.7 C) (02/13 0436) Temp src: Oral (02/13 0436) BP: 105/59 mmHg (02/13 0436) Pulse Rate: 79 (02/13 0436)  Labs:  Recent Labs  02/14/12 1130 02/14/12 1205 02/15/12 0435 02/15/12 0735 02/16/12 0446 02/16/12 0926  HGB 11.6*  --  10.9*  --  10.6*  --   HCT 35.4*  --  34.5*  --  33.3*  --   PLT 235  --  247  --  258  --   LABPROT  --  39.2*  --  33.2*  --  27.4*  INR  --  4.39*  --  3.51*  --  2.71*  CREATININE 1.11*  --  1.08  --  0.97  --     Estimated Creatinine Clearance: 62.4 ml/min (by C-G formula based on Cr of 0.97).   Medical History: Past Medical History  Diagnosis Date  . Non Hodgkin's lymphoma   . Allergic rhinitis   . GERD (gastroesophageal reflux disease)   . Asthma   . Chronic sinusitis   . Respiratory failure   . Cavitary lung disease   . PONV (postoperative nausea and vomiting)     Medications:  Prescriptions prior to admission  Medication Sig Dispense Refill  . ACCU-CHEK AVIVA PLUS test strip 1 each by Other route 2 (two) times a week.       [name] Kitchen acetaminophen (TYLENOL) 500 MG tablet Take 500 mg by mouth every 6 (six) hours as needed for pain or fever.      [name] Kitchen acyclovir (ZOVIRAX) 400 MG tablet Take 1 tablet twice daily  60 tablet  6  . albuterol (PROVENTIL HFA;VENTOLIN HFA) 108 (90 BASE) MCG/ACT inhaler Inhale 2 puffs into the lungs every 6 (six) hours as needed for wheezing or shortness of breath.      [name] Kitchen albuterol (PROVENTIL) (2.5 MG/3ML) 0.083% nebulizer solution Take 2.5 mg by nebulization every 4 (four) hours as needed for wheezing or shortness of breath.      .  benzonatate (TESSALON) 200 MG capsule Take 200 mg by mouth 3 (three) times daily as needed for cough.      . citalopram (CELEXA) 20 MG tablet Take 20 mg by mouth every morning.        . furosemide (LASIX) 20 MG tablet Take 1 tablet by mouth every other day as needed. *Usually takes as needed for fluid retention upon weighing in**      . Lancets (ACCU-CHEK MULTICLIX) lancets 1 each by Other route 2 (two) times a week.       . mometasone-formoterol (DULERA) 200-5 MCG/ACT AERO Inhale 2 puffs into the lungs 2 (two) times daily.      [name] Kitchen omeprazole (PRILOSEC) 20 MG capsule Take 1 capsule (20 mg total) by mouth 2 (two) times daily.  60 capsule  6  . predniSONE (DELTASONE) 10 MG tablet Take 5 mg by mouth every morning.      . simvastatin (ZOCOR) 80 MG tablet Take 80 mg by mouth at bedtime.        [name] Kitchen warfarin (COUMADIN) 1 MG tablet Take 1-2 mg by mouth every evening. She takes one tablet with half of a 10mg   tablet to equal her 6mg  dose everyday except for on Saturday and Sunday. She takes two tablets with half of a 10mg  tablet to equal her dose of 7 mg on those two days.      [name] Kitchen warfarin (COUMADIN) 10 MG tablet Take 5 mg by mouth every evening. She takes with a 1 mg tablet to equal her dose of 6mg  everyday except for on Saturday and Sunday. She takes two 1mg  tablets to equal her dose of 7 mg on those two days.      . zafirlukast (ACCOLATE) 20 MG tablet Take 1 tablet (20 mg total) by mouth 2 (two) times daily.  60 tablet  5  . [DISCONTINUED] potassium chloride 40 MEQ/15ML (20%) LIQD ON HOLD       Scheduled:  . acyclovir  400 mg Oral BID  . ipratropium  0.5 mg Nebulization Q6H   And  . albuterol  2.5 mg Nebulization Q6H  . atorvastatin  40 mg Oral q1800  . ceFEPime (MAXIPIME) IV  1 g Intravenous Q8H  . citalopram  20 mg Oral Daily  . fluticasone  1 spray Each Nare BID  . [COMPLETED] Immune Globulin 5%  400 mg/kg Intravenous Once  . insulin aspart  0-15 Units Subcutaneous TID WC  . levofloxacin (LEVAQUIN)  IV  750 mg Intravenous Q24H  . predniSONE  20 mg Oral Q breakfast  . sodium chloride  1 spray Each Nare TID  . vancomycin  1,000 mg Intravenous Q12H  . Warfarin - Pharmacist Dosing Inpatient   Does not apply q1800   Infusions:  . sodium chloride 50 mL/hr at 02/14/12 1143   PRN:   Assessment: 56 yof w/hx NHL on chronic warfarin therapy for port a cath w/ hx of SVC syndrome. Pt admitted 2/11 w/ cough, dyspnea and weakness.  Coumadin was being held d/t possible bronch, no bronch planned now so coumadin resumed, pharmacy to manage dosing. Warfarin dose PTA: 6mg /d except 7mg  Sat/Sun, last 2/10.  INR on admission supratx (4.39), now down to tx range (2.71)  No bleeding reported  D3/3 Levaquin - may decrease coumadin requirements  Goal of Therapy:  INR 2-3   Plan:  Daily INR Coumadin 4mg  today  Gwen Her PharmD  [phone] 02/16/2012 11:45 AM

## 2012-02-16 NOTE — Telephone Encounter (Signed)
Please call this patient to arrange a posthospital f/u OV with tammy parrett next week. Call her cell phone

## 2012-02-16 NOTE — Progress Notes (Addendum)
PULMONARY  / CRITICAL CARE MEDICINE  Name: [name] MRN: [id number] DOB: November 10, 1955    ADMISSION DATE:  02/14/2012  CHIEF COMPLAINT:  Cough, dyspnea, weakness  BRIEF PATIENT DESCRIPTION: 57 yo F hx of NHL  Now with persistent bilateral infiltrates on CXR and prior pseudomonas disease and sinus disease.  Admitted d/t FTT  SIGNIFICANT EVENTS / STUDIES:  CT Chest /Sinus pending  LINES / TUBES: PIV  CULTURES: BCx 2 2/11 Sputum 2/11   ANTIBIOTICS: levaquin 2/11 Cefepime 2/11 vanco 2/11  HISTORY OF PRESENT ILLNESS:   57 yo WF with NHL prior hx of DTC asthma and chronic sinusitis.  Pt now with bilateral nodular infiltrates and prior pseudomonas lung infection that has failed to respond to po bactrim. Note INR now > 4.0   the patient notes fever, weakness, lassitude. The patient notes increasing cough and chest congestion. Patient notes increasing wheezing and shortness of breath. The patient is admitted for further inpatient care to include IV antibiotics.   SUBJECTIVE:   VITAL SIGNS: Temp:  [97.5 F (36.4 C)-98.2 F (36.8 C)] 98 F (36.7 C) (02/13 0436) Pulse Rate:  [59-97] 79 (02/13 0436) Resp:  [18-19] 18 (02/13 0436) BP: (99-123)/(50-77) 105/59 mmHg (02/13 0436) SpO2:  [91 %-99 %] 91 % (02/13 0750)  PHYSICAL EXAMINATION: General:  Ill appearing in NAD Neuro:  No focal deficits HEENT:  Dry mucus membranes Neck:  Supple, no tmg, no lan Cardiovascular:  Tachy nl s1 s2 no s3 s4 Lungs:  Rhonchi, poor airflow Abdomen:  Soft NT bsa Musculoskeletal:  From, no joint issues Skin:  clear   Recent Labs Lab 02/14/12 1130 02/15/12 0435 02/16/12 0446  NA 131* 134* 134*  K 4.3 3.8 3.8  CL 94* 96 99  CO2 27 27 25   BUN 14 17 17   CREATININE 1.11* 1.08 0.97  GLUCOSE 92 131* 115*    Recent Labs Lab 02/14/12 1130 02/15/12 0435 02/16/12 0446  HGB 11.6* 10.9* 10.6*  HCT 35.4* 34.5* 33.3*  WBC 18.8* 10.3 10.3  PLT 235 247 258   Ct Chest W Contrast  02/14/2012   *RADIOLOGY REPORT*  Clinical Data: Right upper lobe pulmonary nodule.  CT CHEST WITH CONTRAST  Technique:  Multidetector CT imaging of the chest was performed following the standard protocol during bolus administration of intravenous contrast.  Contrast: 80mL OMNIPAQUE IOHEXOL 300 MG/ML  SOLN  Comparison: Multiple exams, including 01/23/2012 and 01/17/2012  Findings: Small right paratracheal and AP window lymph nodes noted, not pathologically enlarged by size criteria.  Venous collaterals noted in the subcutaneous tissues, with contrast primarily extending through these collateral vessels suggesting right subclavian vein or SVC stenosis.  A small hiatal hernia is present.  Biapical pleuroparenchymal scarring noted.  The previously hypermetabolic lesion of concern anteriorly in the right upper lobe has now completely resolved. A separate 0.9 x 0.5 cm ground-glass opacity nodule is observed in the right upper lobe on image 14 of series 5, not apparent on the PET CT and only very faintly apparent on the CT scan from 01/23/2012.  There is scattered scarring anteriorly in both upper lobes along with some airway thickening and tree in bud opacities posteriorly in the right upper lobe favoring atypical infectious bronchiolitis. Airway thickening and airway plugging noted in both lower lobes with cylindrical bronchiectasis and faint nodularity which appears inflammatory and which has increased compared to the prior exam.  Continued atelectasis noted in the lingula.  Thoracic spondylosis noted.  IMPRESSION:  1.  The previously hypermetabolic  right upper lobe nodule has resolved.  Accordingly, and less specific treatment for malignancy in this vicinity was administered, this was likely an inflammatory lesion. 2.  There was airway thickening with lower lobe cylindrical bronchiectasis and airway plugging, along with scattered faint inflammatory appearing nodularity in the lungs. 3.  Suspected stenotic right subclavian vein or  SVC, with multiple collaterals observed. 4.  Small hiatal hernia.   Original Report Authenticated By: Gaylyn Rong, M.D.    Ct Maxillofacial  Ltd Wo Cm  02/14/2012  *RADIOLOGY REPORT*  Clinical Data:  Facial congestion with bloody mucous  CT PARANASAL SINUS LIMITED WITHOUT CONTRAST  Technique:  Multidetector CT images of the paranasal sinuses were obtained in a single plane without contrast.  Comparison:  11/30/2010  Findings:  Bilateral maxillary antral surgery has been performed. Air-fluid levels are seen in both maxillary sinuses, greater on the right.  Chronic sclerosis of the maxillary sinus walls.  Bilateral ethmoidectomies, with considerable mucosal thickening throughout both ethmoid cavities.  Significant mucosal thickening and fluid accumulation in the sphenoid sinus with air fluid levels, suggesting acuity.  Frontal sinus are grossly clear.  No osseous destructive lesion.  Visualized intracranial compartment unremarkable.  IMPRESSION: Significant acute and chronic sinus disease in the maxillary sinuses despite antral windows.  Significant bilateral sphenoid sinus disease both acute and chronic.  Significant mucosal thickening persists despite bilateral ethmoidectomies.   Original Report Authenticated By: Davonna Belling, M.D.       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 PNA bilateral, chronic with nodular infiltrates.  ?pseudomonas  2. Chronic sinusitis  And acute sinusitis. Confirmed with CT  3. Hx of Asthma DTC  4. NHL hx  5. Border line DM  Plan  Plan d/c home  See d/c summary   [name]   02/16/2012, 10:08 AM

## 2012-02-16 NOTE — Progress Notes (Signed)
  Pharmacy Note (Brief) - D/C Warfarin per Rx  56 yof on chronic warfarin therapy for port a cath w/ hx of SVC syndrome. INR now <3. Clarified with Brett Canales Minor, NP - no more warfarin for now, plan is possible FOB when INR <2.  1) D/C warfarin per Rx consult 2) Will leave scheduled daily INRs for MD/NP 3) Please reorder "warfarin per pharmacy" when warfarin dosing is desired. Pharmacy will sign off now.  Darrol Angel, PharmD Pager: [phone] 02/16/2012 10:24 AM

## 2012-02-17 LAB — RESPIRATORY VIRUS PANEL
Adenovirus: NOT DETECTED
Influenza A H3: NOT DETECTED
Influenza A: NOT DETECTED
Parainfluenza 2: NOT DETECTED
Respiratory Syncytial Virus A: NOT DETECTED
Respiratory Syncytial Virus B: NOT DETECTED
Rhinovirus: NOT DETECTED

## 2012-02-17 LAB — CULTURE, RESPIRATORY W GRAM STAIN

## 2012-02-20 LAB — CULTURE, BLOOD (ROUTINE X 2): Culture: NO GROWTH

## 2012-02-20 LAB — LEGIONELLA PNEUMOPHILA TOTAL AB
Serogroup 1: <1:16 {titer}
Serogroups 2,3,4,5,6,8: <1:16 {titer}

## 2012-02-20 NOTE — Telephone Encounter (Signed)
The pt has been scheduled for HFU with TP on Thurs., 02/23/12 @ 4:15. Pt aware.

## 2012-02-23 ENCOUNTER — Inpatient Hospital Stay: Payer: BC Managed Care – PPO | Admitting: Adult Health

## 2012-02-24 ENCOUNTER — Other Ambulatory Visit: Payer: Self-pay | Admitting: *Deleted

## 2012-02-24 MED ORDER — ZAFIRLUKAST 20 MG PO TABS: 20.0000 mg | ORAL_TABLET | Freq: Two times a day (BID) | ORAL | Status: DC

## 2012-02-27 ENCOUNTER — Ambulatory Visit (INDEPENDENT_AMBULATORY_CARE_PROVIDER_SITE_OTHER)
Admission: RE | Admit: 2012-02-27 | Discharge: 2012-02-27 | Disposition: A | Discharge status: Home / Self Care | Payer: BC Managed Care – PPO | Source: Ambulatory Visit | Attending: Adult Health | Admitting: Adult Health

## 2012-02-27 ENCOUNTER — Ambulatory Visit (INDEPENDENT_AMBULATORY_CARE_PROVIDER_SITE_OTHER): Payer: BC Managed Care – PPO | Admitting: Adult Health

## 2012-02-27 ENCOUNTER — Encounter: Payer: Self-pay | Admitting: Adult Health

## 2012-02-27 VITALS — BP 114/76 | HR 79 | Temp 98.2°F | Ht 62.0 in | Wt 169.4 lb

## 2012-02-27 DIAGNOSIS — J189 Pneumonia, unspecified organism: Secondary | ICD-10-CM

## 2012-02-27 DIAGNOSIS — J45909 Unspecified asthma, uncomplicated: Secondary | ICD-10-CM

## 2012-02-27 NOTE — Patient Instructions (Addendum)
Finish Levaquin  Keep Prednisone at 20mg  daily  Flutter valve 3-4 times a day  Fluids and rest  Tylenol As needed   Saline rinses As needed   Please contact office for sooner follow up if symptoms do not improve or worsen or seek emergency care  follow up Dr. Delford Field  In 3 weeks with chest xray

## 2012-02-27 NOTE — Progress Notes (Signed)
Subjective:    Patient ID: [name], female    DOB: [birth year], 57 y.o.   MRN: [id number]  HPI  57 y.o.WF former smoker  Asthma , hypogammaglobulinemia, chronic sinusits d/t pseudomonas Hx of Diffuse large B-Cell Lymphoma, S/P R-CHOP x 4 cycles with intrathecal chemotherapy during cycles 3 and 4 with methotrexate and Solu-Cortef.  Treatment was stopped due to Pseudomonas sepsis after cycles 2 and 4 which was very severe.     Rituxan alone  Every month, last dose 09/30/11. Now on Hold. PET due in 01/2012   11/07/2011 Acute OV  Complains of prod cough with yellow mucus, head congestion with yellow drainage, some PND, increased SOB, some wheezing, tightness w/ coughing, fever x10days - scheduled for IVIG 11/5-6  Coumadin checks ok, INR last 2.5, - 2 weeks ago.  Currently on  Prednisone 5mg  daily  No hemoptysis . Ran fever initially -low grade . No fever for 3 days .  No edema.   12/14/2011 Pt with more cough and chest tightness.  No CXR done . Rx Levaquin x 10days and this helped per pcp. On prednisone 5mg  /d  No hemoptysis.  No fever now.  No sweats or chills.  Notes sinus pressure as before.  PUL ASTHMA HISTORY 01/25/2012 12/14/2011 10/11/2011 08/12/2011 06/17/2011  Symptoms Daily Daily >2 days/week 0-2 days/week 0-2 days/week  Nighttime awakenings Often--7/wk 3-4/month 0-2/month 0-2/month 3-4/month  Interference with activity Some limitations Some limitations Minor limitations Minor limitations Minor limitations  SABA use Several times/day Daily > 2 days/wk--not > 1 x/day 0-2 days/wk 0-2 days/wk  Exacerbations requiring oral steroids 2 or more / year 2 or more / year 0-1 / year 0-1 / year 0-1 / year   01/25/2012 As the last visit the patient is gotten much worse with increasing cough productive of thick yellow mucus mixed with blood. The patient is also more dyspneic. There is no fever chills or sweats. Recent PET scan showed a nodular density right upper lobe. Followup CT scan of the chest  demonstrated this nodule is now more groundglass inflammatory infiltrate. There is no evidence of recurrent lymphoma seen on the recent PET scan.   02/27/2012 [facility]  follow up  Admitted for acute sinusitis and asthma flare w/ FTT .  CT sinus revealed severe sinusitis .  Tx w/ aggressive abx  . Discharged on Levaquin x 14d.  Has 3 days left of levaquin  Did receive gamma globulin injection for  Low IgG.  No hemoptysis , leg swelling.  INR last week at 4.4 . Coumadin was held and dose adjusted.  Has INR check on 2/28.    Review of Systems  Constitutional:   No  weight loss, night sweats,  Fevers, chills, fatigue, lassitude. HEENT:   No headaches,  Difficulty swallowing,  Tooth/dental problems,  Sore throat,                No sneezing, itching, ear ache CV:  No chest pain,  Orthopnea, PND, swelling in lower extremities, anasarca, dizziness, palpitations  GI  No heartburn, indigestion, abdominal pain, nausea, vomiting, diarrhea, change in bowel habits, loss of appetite  Resp: No shortness of breath with exertion or at rest.  +++ excess mucus, No wheezing.  No chest wall deformity  Skin: no rash or lesions.  GU: no dysuria, change in color of urine, no urgency or frequency.  No flank pain.  MS:  No joint pain or swelling.  No decreased range of motion.  No back pain.  Psych:  No change in mood or affect. No depression or anxiety.  No memory loss.     Objective:   Physical Exam  Filed Vitals:   02/27/12 1547  BP: 114/76  Pulse: 79  Temp: 98.2 F (36.8 C)  TempSrc: Oral  Height: 5\' 2"  (1.575 m)  Weight: 169 lb 6.4 oz (76.839 kg)  SpO2: 94%    Gen: Pleasant, well-nourished, in no distress,  normal affect  ENT: No lesions,  mouth clear,  oropharynx clear, ++ postnasal drip, bilateral nasal purulence  Neck: No JVD, no TMG, no carotid bruits  Lungs: No use of accessory muscles, no dullness to percussion,no wheezing   Cardiovascular: RRR, heart sounds normal, no  murmur or gallops, no peripheral edema  Abdomen: soft and NT, no HSM,  BS normal  Musculoskeletal: No deformities, no cyanosis or clubbing  Neuro: alert, non focal  Skin: Warm, no lesions or rashes        Assessment & Plan:   No problem-specific assessment & plan notes found for this encounter.   Updated Medication List Outpatient Encounter Prescriptions as of 02/27/2012  Medication Sig Dispense Refill  . ACCU-CHEK AVIVA PLUS test strip 1 each by Other route 2 (two) times a week.       [name] Kitchen acetaminophen (TYLENOL) 500 MG tablet Take 500 mg by mouth every 6 (six) hours as needed for pain or fever.      [name] Kitchen acyclovir (ZOVIRAX) 400 MG tablet Take 1 tablet twice daily  60 tablet  6  . albuterol (PROVENTIL HFA;VENTOLIN HFA) 108 (90 BASE) MCG/ACT inhaler Inhale 2 puffs into the lungs every 6 (six) hours as needed for wheezing or shortness of breath.      [name] Kitchen albuterol (PROVENTIL) (2.5 MG/3ML) 0.083% nebulizer solution Take 2.5 mg by nebulization every 4 (four) hours as needed for wheezing or shortness of breath.      . benzonatate (TESSALON) 200 MG capsule Take 200 mg by mouth 3 (three) times daily as needed for cough.      . citalopram (CELEXA) 20 MG tablet Take 20 mg by mouth every morning.        . fluticasone (FLONASE) 50 MCG/ACT nasal spray Place 2 sprays into the nose 2 (two) times daily.  16 g  6  . furosemide (LASIX) 20 MG tablet Take 1 tablet by mouth every other day as needed. *Usually takes as needed for fluid retention upon weighing in**      . Lancets (ACCU-CHEK MULTICLIX) lancets 1 each by Other route 2 (two) times a week.       . mometasone-formoterol (DULERA) 200-5 MCG/ACT AERO Inhale 2 puffs into the lungs 2 (two) times daily.      [name] Kitchen omeprazole (PRILOSEC) 20 MG capsule Take 1 capsule (20 mg total) by mouth 2 (two) times daily.  60 capsule  6  . predniSONE (DELTASONE) 20 MG tablet Take 20 mg by mouth daily.      . simvastatin (ZOCOR) 80 MG tablet Take 80 mg by mouth at bedtime.         [name] Kitchen warfarin (COUMADIN) 1 MG tablet Take 1-2 mg by mouth every evening. She takes one tablet with half of a 10mg  tablet to equal her 6mg  dose everyday except for on Saturday and Sunday. She takes two tablets with half of a 10mg  tablet to equal her dose of 7 mg on those two days.      [name] Kitchen warfarin (COUMADIN) 10 MG tablet Take 5 mg by mouth every  evening. She takes with a 1 mg tablet to equal her dose of 6mg  everyday except for on Saturday and Sunday. She takes two 1mg  tablets to equal her dose of 7 mg on those two days.      . zafirlukast (ACCOLATE) 20 MG tablet Take 1 tablet (20 mg total) by mouth 2 (two) times daily.  60 tablet  5  . [DISCONTINUED] predniSONE (DELTASONE) 20 MG tablet Take 2 daily for 7days then 1 daily  60 tablet  6  . [DISCONTINUED] levofloxacin (LEVAQUIN) 750 MG tablet Take 1 tablet (750 mg total) by mouth daily.  14 tablet  0   Facility-Administered Encounter Medications as of 02/27/2012  Medication Dose Route Frequency Provider Last Rate Last Dose  . 0.9 %  sodium chloride infusion   Intravenous Continuous Randall An, MD 20 mL/hr at 05/12/11 0914    . Immune Globulin 5% (OCTAGAM) SOLN 10 g  10 g Intravenous Once Ellouise Newer, [place]      . Immune Globulin 5% (OCTAGAM) SOLN 70 g  70 g Intravenous Once Ellouise Newer, [place]

## 2012-02-28 ENCOUNTER — Encounter: Payer: Self-pay | Admitting: Adult Health

## 2012-02-28 NOTE — Assessment & Plan Note (Addendum)
Recent exacerbation with hospitalization  cxr today w/ Lingular atelectasis or infiltrate, increasing since prior study. Clinically she is improving  Will keep prednisone at 20mg  daily and have her finish abx course  Plan   Finish Levaquin  Keep Prednisone at 20mg  daily  Flutter valve 3-4 times a day  Fluids and rest  Tylenol As needed   Saline rinses As needed   Please contact office for sooner follow up if symptoms do not improve or worsen or seek emergency care  follow up Dr. Delford Field  In 3 weeks with chest xray

## 2012-03-19 ENCOUNTER — Ambulatory Visit (INDEPENDENT_AMBULATORY_CARE_PROVIDER_SITE_OTHER): Payer: BC Managed Care – PPO | Admitting: Adult Health

## 2012-03-19 ENCOUNTER — Encounter: Payer: Self-pay | Admitting: Adult Health

## 2012-03-19 ENCOUNTER — Ambulatory Visit (INDEPENDENT_AMBULATORY_CARE_PROVIDER_SITE_OTHER)
Admission: RE | Admit: 2012-03-19 | Discharge: 2012-03-19 | Disposition: A | Discharge status: Home / Self Care | Payer: BC Managed Care – PPO | Source: Ambulatory Visit | Attending: Adult Health | Admitting: Adult Health

## 2012-03-19 VITALS — BP 120/84 | HR 80 | Temp 97.9°F | Ht 62.0 in | Wt 173.6 lb

## 2012-03-19 DIAGNOSIS — J45909 Unspecified asthma, uncomplicated: Secondary | ICD-10-CM

## 2012-03-19 DIAGNOSIS — J189 Pneumonia, unspecified organism: Secondary | ICD-10-CM

## 2012-03-19 DIAGNOSIS — K219 Gastro-esophageal reflux disease without esophagitis: Secondary | ICD-10-CM

## 2012-03-19 DIAGNOSIS — J309 Allergic rhinitis, unspecified: Secondary | ICD-10-CM

## 2012-03-19 MED ORDER — MOMETASONE FURO-FORMOTEROL FUM 200-5 MCG/ACT IN AERO: 2.0000 | INHALATION_SPRAY | Freq: Two times a day (BID) | RESPIRATORY_TRACT | Status: DC

## 2012-03-19 MED ORDER — OMEPRAZOLE 20 MG PO CPDR: 20.0000 mg | DELAYED_RELEASE_CAPSULE | Freq: Two times a day (BID) | ORAL | Status: DC

## 2012-03-19 NOTE — Assessment & Plan Note (Signed)
CXR today shows persistent lingular atx vs infiltrate , had recent CT scan 02/2012  ? Pneumonitis pattern w/ There was airway thickening with lower lobe cylindrical  bronchiectasis and airway plugging, along with scattered faint inflammatory appearing nodularity in the lungs. Pt has finished all abx and is clinically improving  Will hold on additional abx  Can follow cxr as indicated.

## 2012-03-19 NOTE — Patient Instructions (Addendum)
Decrease Prednisone 30mg  daily for 1 week then 20mg  daily -hold at this dose until seen back in office Flutter valve 3-4 times a day  Fluids and rest  Saline nasal rinses As needed   May use Allegra 180mg  daily As needed  Drainage  Please contact office for sooner follow up if symptoms do not improve or worsen or seek emergency care  We are setting you up for an overnight oxygen test.  Follow up  Parrett 4 weeks and As needed   Place on call back list for ov with Dr. Delford Field  In 8 weeks

## 2012-03-19 NOTE — Assessment & Plan Note (Signed)
Recent flare with associated bronchitis/ ? PNA >now resolving clinically  Control triggers   Plan  Decrease Prednisone 30mg  daily for 1 week then 20mg  daily -hold at this dose until seen back in office Flutter valve 3-4 times a day  Fluids and rest  Saline nasal rinses As needed   May use Allegra 180mg  daily As needed  Drainage  Please contact office for sooner follow up if symptoms do not improve or worsen or seek emergency care  We are setting you up for an overnight oxygen test.  Follow up  Parrett 4 weeks and As needed   Place on call back list for ov with Dr. Delford Field  In 8 weeks

## 2012-03-19 NOTE — Addendum Note (Signed)
Addended by: Boone Master E on: 03/19/2012 12:48 PM   Modules accepted: Orders

## 2012-03-19 NOTE — Assessment & Plan Note (Signed)
May add allegra As needed

## 2012-03-19 NOTE — Progress Notes (Signed)
Subjective:    Patient ID: [name], female    DOB: 05-04-1955, 57 y.o.   MRN: [id number]  HPI 57 y.o.WF former smoker  Asthma , hypogammaglobulinemia, chronic sinusits d/t pseudomonas Hx of Diffuse large B-Cell Lymphoma, S/P R-CHOP x 4 cycles with intrathecal chemotherapy during cycles 3 and 4 with methotrexate and Solu-Cortef.  Treatment was stopped due to Pseudomonas sepsis after cycles 2 and 4 which was very severe.     Rituxan alone  Every month, last dose 09/30/11. Now on Hold. PET due in 01/2012   11/07/2011 Acute OV  Complains of prod cough with yellow mucus, head congestion with yellow drainage, some PND, increased SOB, some wheezing, tightness w/ coughing, fever x10days - scheduled for IVIG 11/5-6  Coumadin checks ok, INR last 2.5, - 2 weeks ago.  Currently on  Prednisone 5mg  daily  No hemoptysis . Ran fever initially -low grade . No fever for 3 days .  No edema.   12/14/2011 Pt with more cough and chest tightness.  No CXR done . Rx Levaquin x 10days and this helped per pcp. On prednisone 5mg  /d  No hemoptysis.  No fever now.  No sweats or chills.  Notes sinus pressure as before.  PUL ASTHMA HISTORY 01/25/2012 12/14/2011 10/11/2011 08/12/2011 06/17/2011  Symptoms Daily Daily >2 days/week 0-2 days/week 0-2 days/week  Nighttime awakenings Often--7/wk 3-4/month 0-2/month 0-2/month 3-4/month  Interference with activity Some limitations Some limitations Minor limitations Minor limitations Minor limitations  SABA use Several times/day Daily > 2 days/wk--not > 1 x/day 0-2 days/wk 0-2 days/wk  Exacerbations requiring oral steroids 2 or more / year 2 or more / year 0-1 / year 0-1 / year 0-1 / year   01/25/2012 As the last visit the patient is gotten much worse with increasing cough productive of thick yellow mucus mixed with blood. The patient is also more dyspneic. There is no fever chills or sweats. Recent PET scan showed a nodular density right upper lobe. Followup CT scan of the chest  demonstrated this nodule is now more groundglass inflammatory infiltrate. There is no evidence of recurrent lymphoma seen on the recent PET scan.   02/27/12  Post [facility]  follow up  Admitted for acute sinusitis and asthma flare w/ FTT .  CT sinus revealed severe sinusitis .  Tx w/ aggressive abx  . Discharged on Levaquin x 14d.  Has 3 days left of levaquin  Did receive gamma globulin injection for  Low IgG.  No hemoptysis , leg swelling.  INR last week at 4.4 . Coumadin was held and dose adjusted.  Has INR check on 2/28.  >finish levaquin .    03/19/2012 Follow up  Returns for follow up for recent asthma flare with sinusitis and bronchitis ? Lingular infiltrate.  She finished levaquin completely  She is feeling better. Still wears out easily but activity tolerance is picking back up  Currently is on prednisone 40mg  daily  Denies chest pain, edema, or hemotpysis  Has drippy nose and watery eyes over last few days  CXR today with no significant change in lingular infiltrate vs atx     Review of Systems  Constitutional:   No  weight loss, night sweats,  Fevers, chills, + fatigue, lassitude. HEENT:   No headaches,  Difficulty swallowing,  Tooth/dental problems,  Sore throat,                No sneezing, itching, ear ache CV:  No chest pain,  Orthopnea, PND, swelling in  lower extremities, anasarca, dizziness, palpitations  GI  No heartburn, indigestion, abdominal pain, nausea, vomiting, diarrhea, change in bowel habits, loss of appetite  Resp:  No wheezing.  No chest wall deformity  Skin: no rash or lesions.  GU: no dysuria, change in color of urine, no urgency or frequency.  No flank pain.  MS:  No joint pain or swelling.  No decreased range of motion.  No back pain.  Psych:  No change in mood or affect. No depression or anxiety.  No memory loss.     Objective:   Physical Exam  Filed Vitals:   03/19/12 0950  BP: 120/84  Pulse: 80  Temp: 97.9 F (36.6 C)  TempSrc: Oral   Height: 5\' 2"  (1.575 m)  Weight: 173 lb 9.6 oz (78.744 kg)  SpO2: 97%    Gen: Pleasant, well-nourished, in no distress,  normal affect  ENT: No lesions,  mouth clear,  oropharynx clear, + postnasal drip, bilateral nasal purulence  Neck: No JVD, no TMG, no carotid bruits  Lungs: No use of accessory muscles, no dullness to percussion,no wheezing   Cardiovascular: RRR, heart sounds normal, no murmur or gallops, no peripheral edema  Abdomen: soft and NT, no HSM,  BS normal  Musculoskeletal: No deformities, no cyanosis or clubbing  Neuro: alert, non focal  Skin: Warm, no lesions or rashes        Assessment & Plan:   ALLERGIC RHINITIS May add allegra As needed    Severe persistent asthma with chronic sinusitis Recent flare with associated bronchitis/ ? PNA >now resolving clinically  Control triggers   Plan  Decrease Prednisone 30mg  daily for 1 week then 20mg  daily -hold at this dose until seen back in office Flutter valve 3-4 times a day  Fluids and rest  Saline nasal rinses As needed   May use Allegra 180mg  daily As needed  Drainage  Please contact office for sooner follow up if symptoms do not improve or worsen or seek emergency care  We are setting you up for an overnight oxygen test.  Follow up  [place] 4 weeks and As needed   Place on call back list for ov with Dr. Delford Field  In 8 weeks    Pneumonia CXR today shows persistent lingular atx vs infiltrate , had recent CT scan 02/2012  ? Pneumonitis pattern w/ There was airway thickening with lower lobe cylindrical  bronchiectasis and airway plugging, along with scattered faint inflammatory appearing nodularity in the lungs. Pt has finished all abx and is clinically improving  Will hold on additional abx  Can follow cxr as indicated.      Updated Medication List Outpatient Encounter Prescriptions as of 03/19/2012  Medication Sig Dispense Refill  . ACCU-CHEK AVIVA PLUS test strip 1 each by Other route 2 (two) times  a week.       [name] Kitchen acetaminophen (TYLENOL) 500 MG tablet Take 500 mg by mouth every 6 (six) hours as needed for pain or fever.      [name] Kitchen acyclovir (ZOVIRAX) 400 MG tablet Take 1 tablet twice daily  60 tablet  6  . albuterol (PROVENTIL HFA;VENTOLIN HFA) 108 (90 BASE) MCG/ACT inhaler Inhale 2 puffs into the lungs every 6 (six) hours as needed for wheezing or shortness of breath.      [name] Kitchen albuterol (PROVENTIL) (2.5 MG/3ML) 0.083% nebulizer solution Take 2.5 mg by nebulization every 4 (four) hours as needed for wheezing or shortness of breath.      . benzonatate (TESSALON) 200  MG capsule Take 200 mg by mouth 3 (three) times daily as needed for cough.      . citalopram (CELEXA) 20 MG tablet Take 20 mg by mouth every morning.        . fluticasone (FLONASE) 50 MCG/ACT nasal spray Place 2 sprays into the nose 2 (two) times daily.  16 g  6  . furosemide (LASIX) 20 MG tablet Take 1 tablet by mouth every other day as needed. *Usually takes as needed for fluid retention upon weighing in**      . Lancets (ACCU-CHEK MULTICLIX) lancets 1 each by Other route 2 (two) times a week.       . mometasone-formoterol (DULERA) 200-5 MCG/ACT AERO Inhale 2 puffs into the lungs 2 (two) times daily.      [name] Kitchen omeprazole (PRILOSEC) 20 MG capsule Take 1 capsule (20 mg total) by mouth 2 (two) times daily.  60 capsule  6  . predniSONE (DELTASONE) 20 MG tablet Take 20 mg by mouth daily.      . simvastatin (ZOCOR) 80 MG tablet Take 80 mg by mouth at bedtime.        [name] Kitchen warfarin (COUMADIN) 1 MG tablet Take 1-2 mg by mouth every evening. She takes one tablet with half of a 10mg  tablet to equal her 6mg  dose everyday except for on Saturday and Sunday. She takes two tablets with half of a 10mg  tablet to equal her dose of 7 mg on those two days.      [name] Kitchen warfarin (COUMADIN) 10 MG tablet Take 5 mg by mouth every evening. She takes with a 1 mg tablet to equal her dose of 6mg  everyday except for on Saturday and Sunday. She takes two 1mg  tablets to equal her  dose of 7 mg on those two days.      . zafirlukast (ACCOLATE) 20 MG tablet Take 1 tablet (20 mg total) by mouth 2 (two) times daily.  60 tablet  5   Facility-Administered Encounter Medications as of 03/19/2012  Medication Dose Route Frequency Provider Last Rate Last Dose  . 0.9 %  sodium chloride infusion   Intravenous Continuous Randall An, MD 20 mL/hr at 05/12/11 0914    . Immune Globulin 5% (OCTAGAM) SOLN 10 g  10 g Intravenous Once [employer], PA-C      . Immune Globulin 5% (OCTAGAM) SOLN 70 g  70 g Intravenous Once Ellouise Newer, PA-C

## 2012-03-30 ENCOUNTER — Telehealth: Payer: Self-pay | Admitting: Adult Health

## 2012-03-30 ENCOUNTER — Encounter: Payer: Self-pay | Admitting: Adult Health

## 2012-03-30 DIAGNOSIS — J45909 Unspecified asthma, uncomplicated: Secondary | ICD-10-CM

## 2012-03-30 NOTE — Telephone Encounter (Signed)
3.26.14 ONO on room air  Per TP: + desats with sleep, begin O2 2L  LMOM TCB x1 to discuss with patient Order placed ONO was done thru [facility]

## 2012-04-02 IMAGING — CR DG CHEST 2V
2 series · 2 of 2 positions shown · non-contrast
Comparison: Chest CT 06/07/2010 and chest radiograph 06/07/2010.
In the chest radiograph 12/14/2009.

CLINICAL DATA: Cough and asthma

CHEST - 2 VIEW

[view not recorded (1 of 2)]
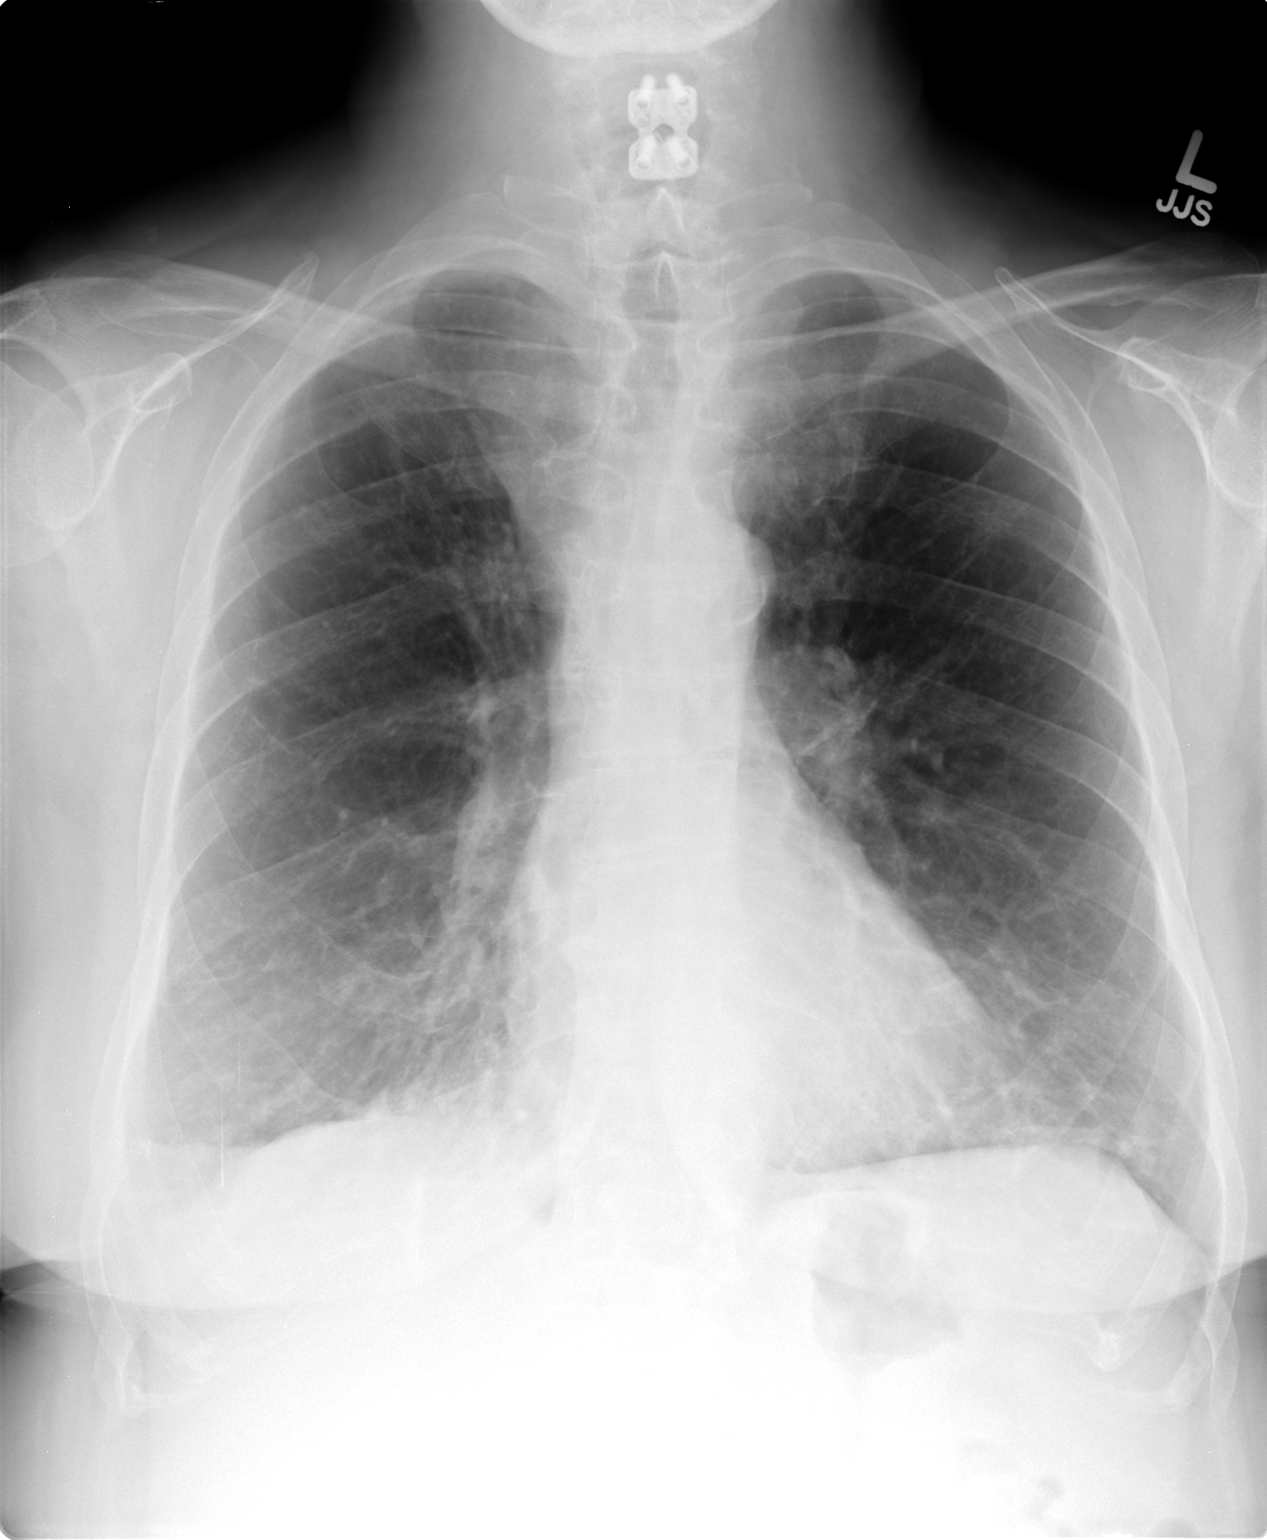

[view not recorded (2 of 2)]
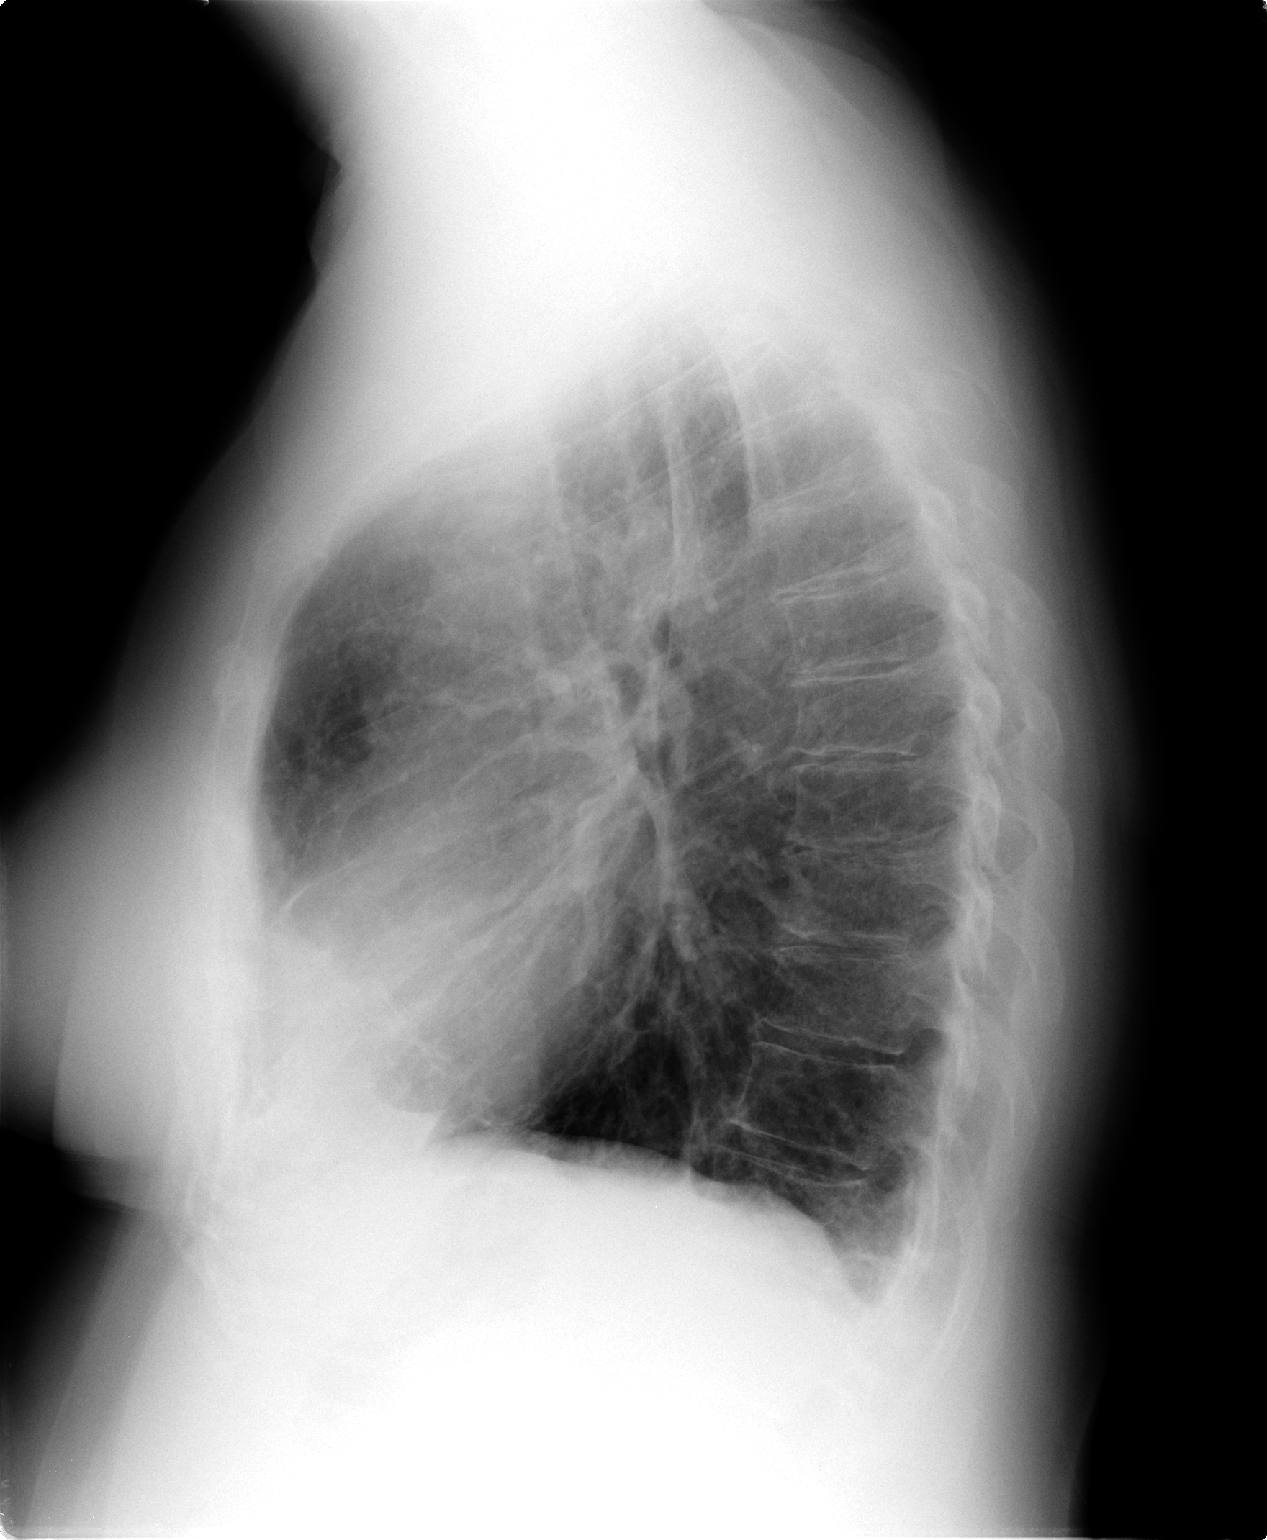

[2 of 2 positions shown; findings below may reference images not displayed]

FINDINGS: There postsurgical changes of the lower cervical spine.
The heart, mediastinal, and hilar contours are normal.
Atherosclerotic calcification of the thoracic aortic arch noted.
There is some peribronchial thickening/slight interstitial
prominence at the lung bases that appears similar to prior
examinations.  No focal consolidation, effusion, or pneumothorax.
No acute bony abnormality identified.
IMPRESSION: 1. Chronic peribronchial thickening/slight interstitial prominence
of the lung bases.  This can be seen in the setting of acute or
chronic bronchitis, asthma, or smoking.
2.  No acute abnormality, compared to priors.

## 2012-04-02 NOTE — Telephone Encounter (Signed)
Patient returned call Advised of ONO results / recs as stated by TP Pt verbalized her understanding Is on O2 already at home thru her PCP Advised to continue this 2l at bedtime and w/ naps Will send staff message to [facility]/ [facility] to inform her Patient's chart updated with DME company > [facility] Nothing further needed; will sign off.

## 2012-04-16 ENCOUNTER — Ambulatory Visit (INDEPENDENT_AMBULATORY_CARE_PROVIDER_SITE_OTHER): Payer: Medicare Other | Admitting: Adult Health

## 2012-04-16 ENCOUNTER — Encounter: Payer: Self-pay | Admitting: Adult Health

## 2012-04-16 VITALS — BP 122/74 | HR 67 | Temp 99.1°F | Ht 62.0 in | Wt 178.2 lb

## 2012-04-16 DIAGNOSIS — J45909 Unspecified asthma, uncomplicated: Secondary | ICD-10-CM

## 2012-04-16 DIAGNOSIS — G4719 Other hypersomnia: Secondary | ICD-10-CM | POA: Insufficient documentation

## 2012-04-16 DIAGNOSIS — G4710 Hypersomnia, unspecified: Secondary | ICD-10-CM

## 2012-04-16 NOTE — Assessment & Plan Note (Signed)
Improved compensation , will decrease steroids slowly down to 5mg  daily and hold.  Rhinitis prevention regimen reviewed.  Follow up 4 weeks with Dr. Delford Field  And As needed

## 2012-04-16 NOTE — Addendum Note (Signed)
Addended by: Boone Master E on: 04/16/2012 09:52 AM   Modules accepted: Orders

## 2012-04-16 NOTE — Patient Instructions (Addendum)
Decrease Prrednisone 15mg  daily for 1 week, 10mg  daily for 2 weeks and then 5mg  daily and hold at this dose   Flutter valve 3-4 times a day  Fluids and rest  Saline nasal rinses As needed   Allegra 180mg  daily As needed  Drainage  Please contact office for sooner follow up if symptoms do not improve or worsen or seek emergency care  We are setting you up for an overnight oxygen test.  Follow up  Ceclia Koker 4 weeks and As needed   Place on call back list for ov with Dr. Delford Field  In 8 weeks

## 2012-04-16 NOTE — Progress Notes (Signed)
Subjective:    Patient ID: [name], female    DOB: 1955-06-14, 57 y.o.   MRN: [id number]  HPI 57 y.o.WF former smoker  Asthma , hypogammaglobulinemia, chronic sinusits d/t pseudomonas Hx of Diffuse large B-Cell Lymphoma, S/P R-CHOP x 4 cycles with intrathecal chemotherapy during cycles 3 and 4 with methotrexate and Solu-Cortef.  Treatment was stopped due to Pseudomonas sepsis after cycles 2 and 4 which was very severe.     Rituxan alone  Every month, last dose 09/30/11. Now on Hold. PET due in 01/2012   11/07/2011 Acute OV  Complains of prod cough with yellow mucus, head congestion with yellow drainage, some PND, increased SOB, some wheezing, tightness w/ coughing, fever x10days - scheduled for IVIG 11/5-6  Coumadin checks ok, INR last 2.5, - 2 weeks ago.  Currently on  Prednisone 5mg  daily  No hemoptysis . Ran fever initially -low grade . No fever for 3 days .  No edema.   12/14/2011 Pt with more cough and chest tightness.  No CXR done . Rx Levaquin x 10days and this helped per pcp. On prednisone 5mg  /d  No hemoptysis.  No fever now.  No sweats or chills.  Notes sinus pressure as before.  PUL ASTHMA HISTORY 01/25/2012 12/14/2011 10/11/2011 08/12/2011 06/17/2011  Symptoms Daily Daily >2 days/week 0-2 days/week 0-2 days/week  Nighttime awakenings Often--7/wk 3-4/month 0-2/month 0-2/month 3-4/month  Interference with activity Some limitations Some limitations Minor limitations Minor limitations Minor limitations  SABA use Several times/day Daily > 2 days/wk--not > 1 x/day 0-2 days/wk 0-2 days/wk  Exacerbations requiring oral steroids 2 or more / year 2 or more / year 0-1 / year 0-1 / year 0-1 / year   01/25/2012 As the last visit the patient is gotten much worse with increasing cough productive of thick yellow mucus mixed with blood. The patient is also more dyspneic. There is no fever chills or sweats. Recent PET scan showed a nodular density right upper lobe. Followup CT scan of the chest  demonstrated this nodule is now more groundglass inflammatory infiltrate. There is no evidence of recurrent lymphoma seen on the recent PET scan.   02/27/12  Post Hospital  follow up  Admitted for acute sinusitis and asthma flare w/ FTT .  CT sinus revealed severe sinusitis .  Tx w/ aggressive abx  . Discharged on Levaquin x 14d.  Has 3 days left of levaquin  Did receive gamma globulin injection for  Low IgG.  No hemoptysis , leg swelling.  INR last week at 4.4 . Coumadin was held and dose adjusted.  Has INR check on 2/28.  >finish levaquin .    03/19/12  Follow up  Returns for follow up for recent asthma flare with sinusitis and bronchitis ? Lingular infiltrate.  She finished levaquin completely  She is feeling better. Still wears out easily but activity tolerance is picking back up  Currently is on prednisone 40mg  daily  Denies chest pain, edema, or hemotpysis  Has drippy nose and watery eyes over last few days  CXR today with no significant change in lingular infiltrate vs atx    >>decrease pred 20mg     04/16/2012 Follow up  4 week follow up - reports breathing is doing well overall with some head congestion w/ yellow drainage, PND, prod cough and occ SOB and wheezing d/t weather. Down to prednisone 20mg   daily .  NO flare in cough or wheezing. No increased SABA use.  No breakthrough reflux.  Recent INR  2.3. No hemoptysis   ONO done last ov w/ positive desats , started on nocturnal O2.  Does feel better and more rested.  Does have daytime sleepiness, tired on awakening, and Positive snoring. Wakes up frequently at night ? Reason.  We discussed ? OSA and she agreed to sleep study.       Review of Systems  Constitutional:   No  weight loss, night sweats,  Fevers, chills, + fatigue, lassitude. HEENT:   No headaches,  Difficulty swallowing,  Tooth/dental problems,  Sore throat,                + sneezing, itching, ear ache CV:  No chest pain,  Orthopnea, PND, swelling in lower  extremities, anasarca, dizziness, palpitations  GI  No heartburn, indigestion, abdominal pain, nausea, vomiting, diarrhea, change in bowel habits, loss of appetite  Resp:   No chest wall deformity  Skin: no rash or lesions.  GU: no dysuria, change in color of urine, no urgency or frequency.  No flank pain.  MS:  No joint pain or swelling.  No decreased range of motion.  No back pain.  Psych:  No change in mood or affect. No depression or anxiety.  No memory loss.     Objective:   Physical Exam  Filed Vitals:   04/16/12 0909  BP: 122/74  Pulse: 67  Temp: 99.1 F (37.3 C)  TempSrc: Oral  Height: 5\' 2"  (1.575 m)  Weight: 178 lb 3.2 oz (80.831 kg)  SpO2: 94%    Gen: Pleasant, well-nourished, in no distress,  normal affect  ENT: No lesions,  mouth clear,  oropharynx clear, + postnasal drip  Neck: No JVD, no TMG, no carotid bruits  Lungs: No use of accessory muscles, no dullness to percussion,no wheezing   Cardiovascular: RRR, heart sounds normal, no murmur or gallops, no peripheral edema  Abdomen: soft and NT, no HSM,  BS normal  Musculoskeletal: No deformities, no cyanosis or clubbing  Neuro: alert, non focal  Skin: Warm, no lesions or rashes        Assessment & Plan:   No problem-specific assessment & plan notes found for this encounter.   Updated Medication List Outpatient Encounter Prescriptions as of 04/16/2012  Medication Sig Dispense Refill  . ACCU-CHEK AVIVA PLUS test strip 1 each by Other route 2 (two) times a week.       [name] Kitchen acetaminophen (TYLENOL) 500 MG tablet Take 500 mg by mouth every 6 (six) hours as needed for pain or fever.      [name] Kitchen acyclovir (ZOVIRAX) 400 MG tablet Take 1 tablet twice daily  60 tablet  6  . albuterol (PROVENTIL HFA;VENTOLIN HFA) 108 (90 BASE) MCG/ACT inhaler Inhale 2 puffs into the lungs every 6 (six) hours as needed for wheezing or shortness of breath.      [name] Kitchen albuterol (PROVENTIL) (2.5 MG/3ML) 0.083% nebulizer solution Take 2.5  mg by nebulization every 4 (four) hours as needed for wheezing or shortness of breath.      . benzonatate (TESSALON) 200 MG capsule Take 200 mg by mouth 3 (three) times daily as needed for cough.      . citalopram (CELEXA) 20 MG tablet Take 20 mg by mouth every morning.        . fluticasone (FLONASE) 50 MCG/ACT nasal spray Place 2 sprays into the nose 2 (two) times daily.  16 g  6  . furosemide (LASIX) 20 MG tablet Take 1 tablet by mouth every other day as  needed. *Usually takes as needed for fluid retention upon weighing in**      . Lancets (ACCU-CHEK MULTICLIX) lancets 1 each by Other route 2 (two) times a week.       . mometasone-formoterol (DULERA) 200-5 MCG/ACT AERO Inhale 2 puffs into the lungs 2 (two) times daily.  1 Inhaler  5  . omeprazole (PRILOSEC) 20 MG capsule Take 1 capsule (20 mg total) by mouth 2 (two) times daily.  60 capsule  5  . predniSONE (DELTASONE) 20 MG tablet Take 20 mg by mouth daily.      . simvastatin (ZOCOR) 80 MG tablet Take 80 mg by mouth at bedtime.        [name] Kitchen warfarin (COUMADIN) 1 MG tablet Take 1-2 mg by mouth every evening. She takes one tablet with half of a 10mg  tablet to equal her 6mg  dose everyday except for on Saturday and Sunday. She takes two tablets with half of a 10mg  tablet to equal her dose of 7 mg on those two days.      [name] Kitchen warfarin (COUMADIN) 10 MG tablet Take 5 mg by mouth every evening. She takes with a 1 mg tablet to equal her dose of 6mg  everyday except for on Saturday and Sunday. She takes two 1mg  tablets to equal her dose of 7 mg on those two days.      . zafirlukast (ACCOLATE) 20 MG tablet Take 1 tablet (20 mg total) by mouth 2 (two) times daily.  60 tablet  5   Facility-Administered Encounter Medications as of 04/16/2012  Medication Dose Route Frequency Provider Last Rate Last Dose  . 0.9 %  sodium chloride infusion   Intravenous Continuous Randall An, MD 20 mL/hr at 05/12/11 0914    . Immune Globulin 5% (OCTAGAM) SOLN 10 g  10 g Intravenous  Once [employer], PA-C      . Immune Globulin 5% (OCTAGAM) SOLN 70 g  70 g Intravenous Once Ellouise Newer, PA-C

## 2012-04-16 NOTE — Assessment & Plan Note (Signed)
Several risk factors for OSA ,  Obese, snoring, daytime sleepiness, and frequent nighttime awakening.  Set up for sleep study -split night

## 2012-04-25 IMAGING — CR DG CHEST 2V
2 series · 2 of 2 positions shown · non-contrast
Comparison: 08/11/2010.

CLINICAL DATA: , increase shortness of breath, wheezing, bibasilar
crackles, past history of Hodgkin's lymphoma, Pseudomonas infection

CHEST - 2 VIEW

[view not recorded (1 of 2)]
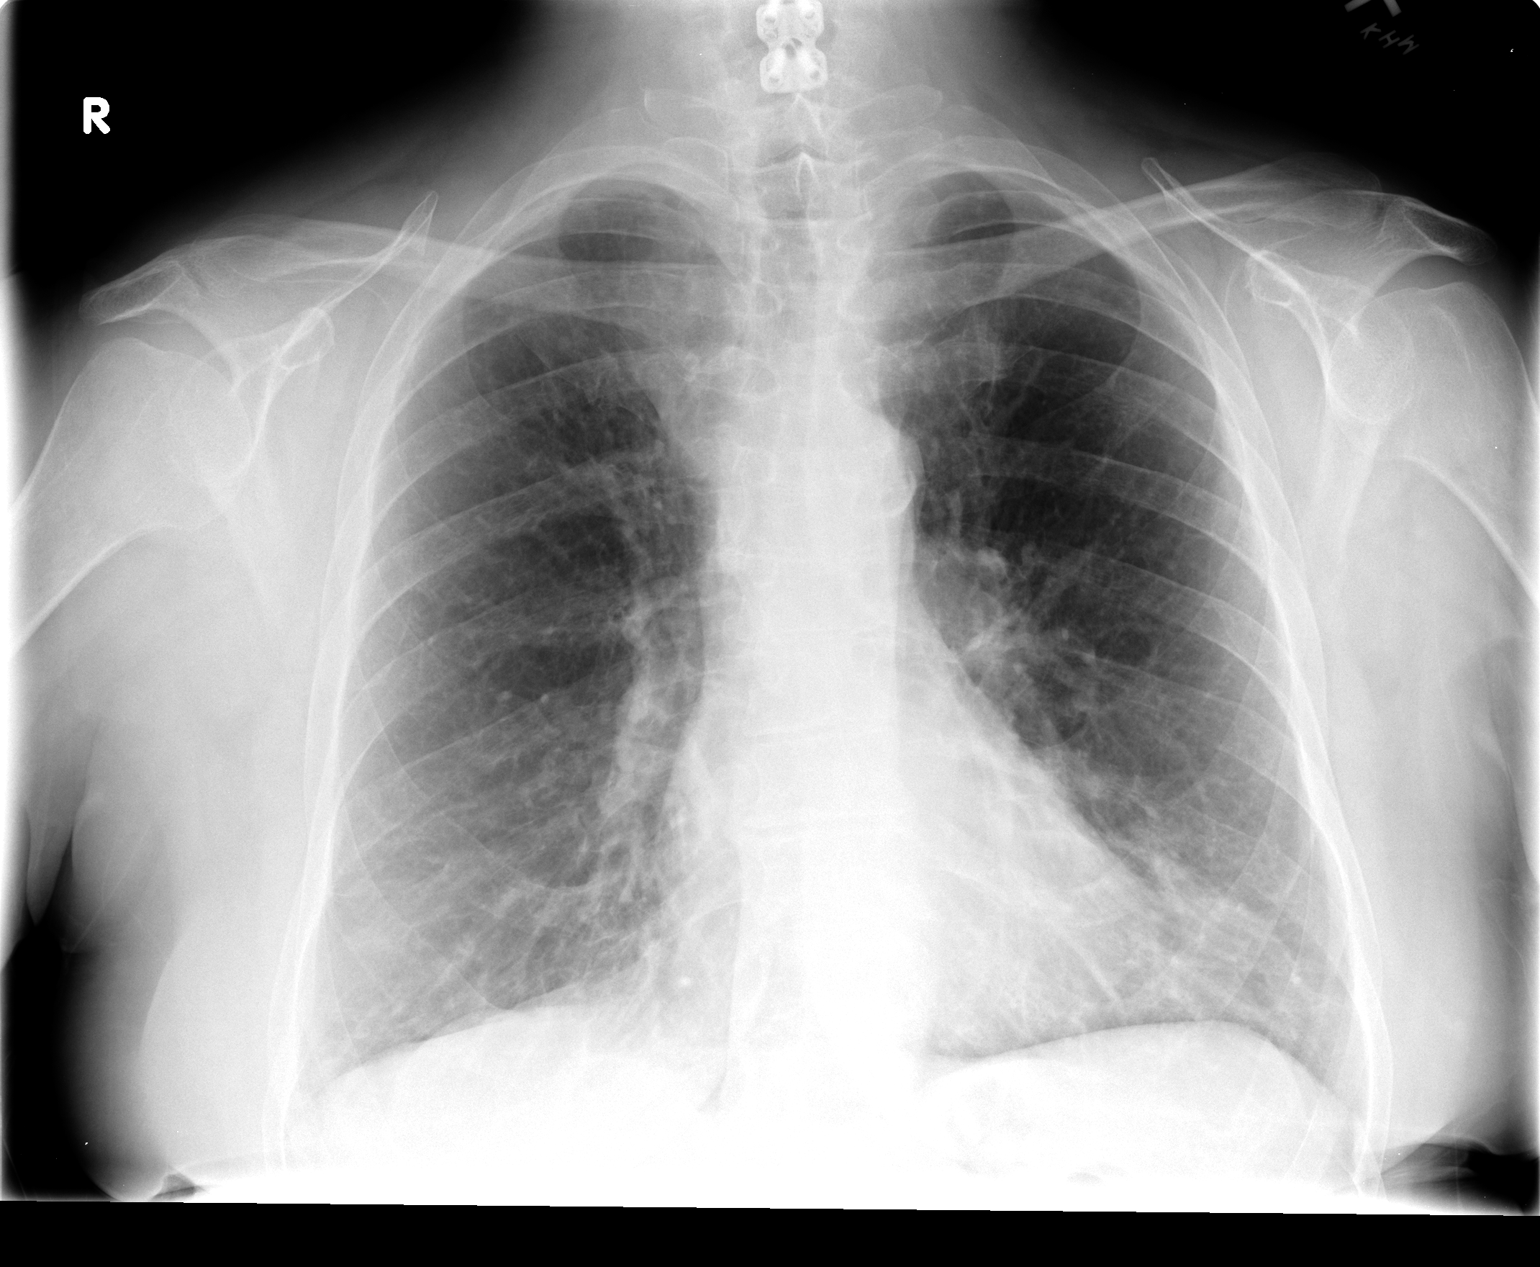

[view not recorded (2 of 2)]
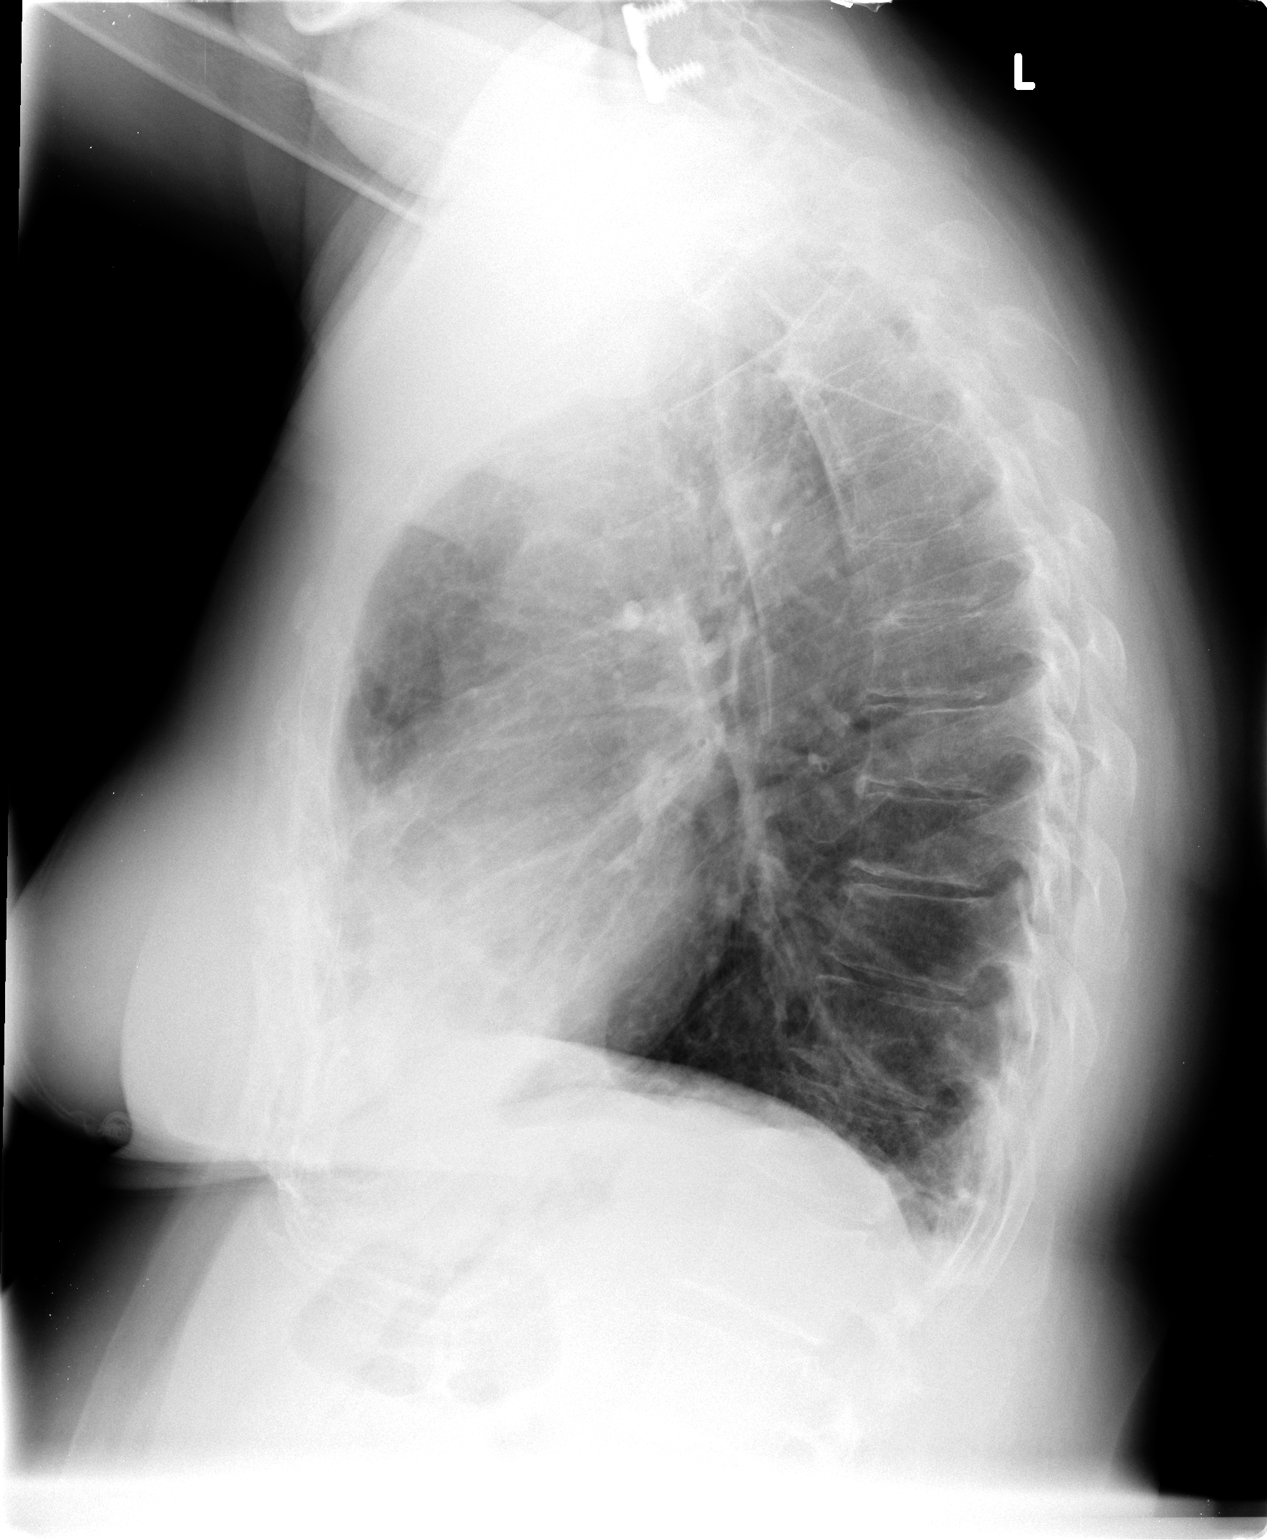

[2 of 2 positions shown; findings below may reference images not displayed]

FINDINGS: Upper normal heart size.
Prominent right superior mediastinal soft tissues stable.
Atherosclerotic calcification aorta.
Prior cervical spine fusion.
Mild bibasilar atelectasis.
Emphysematous and bronchitic changes.
Question minimal lingular infiltrate.
Remaining lungs clear.
No acute osseous findings.
IMPRESSION: Emphysematous and bronchitic changes with bibasilar atelectasis and
question minimal lingular infiltrate

## 2012-05-18 ENCOUNTER — Other Ambulatory Visit: Payer: Self-pay | Admitting: Adult Health

## 2012-05-18 DIAGNOSIS — Z139 Encounter for screening, unspecified: Secondary | ICD-10-CM

## 2012-05-22 ENCOUNTER — Ambulatory Visit (HOSPITAL_COMMUNITY)
Admission: RE | Admit: 2012-05-22 | Discharge: 2012-05-22 | Disposition: A | Discharge status: Home / Self Care | Payer: Medicare Other | Source: Ambulatory Visit | Attending: Adult Health | Admitting: Adult Health

## 2012-05-22 DIAGNOSIS — Z139 Encounter for screening, unspecified: Secondary | ICD-10-CM

## 2012-05-22 DIAGNOSIS — Z1231 Encounter for screening mammogram for malignant neoplasm of breast: Secondary | ICD-10-CM | POA: Insufficient documentation

## 2012-05-25 ENCOUNTER — Ambulatory Visit (HOSPITAL_BASED_OUTPATIENT_CLINIC_OR_DEPARTMENT_OTHER): Payer: Medicare Other | Attending: Adult Health

## 2012-05-25 ENCOUNTER — Encounter: Payer: Self-pay | Admitting: Critical Care Medicine

## 2012-05-25 ENCOUNTER — Ambulatory Visit (INDEPENDENT_AMBULATORY_CARE_PROVIDER_SITE_OTHER): Payer: Medicare Other | Admitting: Critical Care Medicine

## 2012-05-25 VITALS — BP 114/80 | HR 64 | Temp 98.4°F | Ht 62.0 in | Wt 178.6 lb

## 2012-05-25 VITALS — Ht 62.0 in | Wt 172.0 lb

## 2012-05-25 DIAGNOSIS — Z9989 Dependence on other enabling machines and devices: Secondary | ICD-10-CM

## 2012-05-25 DIAGNOSIS — J45909 Unspecified asthma, uncomplicated: Secondary | ICD-10-CM

## 2012-05-25 DIAGNOSIS — G4733 Obstructive sleep apnea (adult) (pediatric): Secondary | ICD-10-CM | POA: Insufficient documentation

## 2012-05-25 MED ORDER — PREDNISONE 20 MG PO TABS: ORAL_TABLET | ORAL | Status: DC

## 2012-05-25 MED ORDER — GEMIFLOXACIN MESYLATE 320 MG PO TABS: 320.0000 mg | ORAL_TABLET | Freq: Every day | ORAL | Status: DC

## 2012-05-25 MED ORDER — PREDNISONE 5 MG PO TABS: ORAL_TABLET | ORAL | Status: DC

## 2012-05-25 NOTE — Patient Instructions (Addendum)
Take factive one daily for 7days (use samples) PRednisone 20mg  Take 3 daily for 2 days then 2 daily for 2 days then 1 daily for two days and stop Hold 5mg  prednisone while on 20mg  pred All other meds same Return 1 month

## 2012-05-25 NOTE — Assessment & Plan Note (Signed)
Severe persistent asthma with sinusitis and bronchitis flare Plan Take factive one daily for 7days (use samples) PRednisone 20mg  Take 3 daily for 2 days then 2 daily for 2 days then 1 daily for two days and stop Hold 5mg  prednisone while on 20mg  pred All other meds same Return 1 month

## 2012-05-25 NOTE — Progress Notes (Signed)
Subjective:    Patient ID: [name], female    DOB: [birth year], 57 y.o.   MRN: [id number]  HPI 57 y.o.WF former smoker  Asthma , hypogammaglobulinemia, chronic sinusits d/t pseudomonas Hx of Diffuse large B-Cell Lymphoma, S/P R-CHOP x 4 cycles with intrathecal chemotherapy during cycles 3 and 4 with methotrexate and Solu-Cortef.  Treatment was stopped due to Pseudomonas sepsis after cycles 2 and 4 which was very severe.     Rituxan alone  Every month, last dose 09/30/11. Now on Hold. PET due in 01/2012   05/25/2012 Pt saw NP on 4/14 and rec pred taper to 5mg /d and get sleep study.   Now : Min cough spells and lose breath and light headed. Occ wheeze, min nasal drip in the am.  Sinus rinse washes out mucus.  Mucus now is yellow.   No fever  PUL ASTHMA HISTORY 05/25/2012 01/25/2012 12/14/2011 10/11/2011 08/12/2011  Symptoms Daily Daily Daily >2 days/week 0-2 days/week  Nighttime awakenings >1/wk but not nightly Often--7/wk 3-4/month 0-2/month 0-2/month  Interference with activity Some limitations Some limitations Some limitations Minor limitations Minor limitations  SABA use Daily Several times/day Daily > 2 days/wk--not > 1 x/day 0-2 days/wk  Exacerbations requiring oral steroids 2 or more / year 2 or more / year 2 or more / year 0-1 / year 0-1 / year    Review of Systems  Constitutional:   No  weight loss, night sweats,  Fevers, chills, + fatigue, lassitude. HEENT:   No headaches,  Difficulty swallowing,  Tooth/dental problems,  Sore throat,                + sneezing, itching, ear ache CV:  No chest pain,  Orthopnea, PND, swelling in lower extremities, anasarca, dizziness, palpitations  GI  No heartburn, indigestion, abdominal pain, nausea, vomiting, diarrhea, change in bowel habits, loss of appetite  Resp:   No chest wall deformity  Skin: no rash or lesions.  GU: no dysuria, change in color of urine, no urgency or frequency.  No flank pain.  MS:  No joint pain or swelling.  No  decreased range of motion.  No back pain.  Psych:  No change in mood or affect. No depression or anxiety.  No memory loss.     Objective:   Physical Exam  Filed Vitals:   05/25/12 1002  BP: 114/80  Pulse: 64  Temp: 98.4 F (36.9 C)  TempSrc: Oral  Height: 5\' 2"  (1.575 m)  Weight: 178 lb 9.6 oz (81.012 kg)  SpO2: 92%    Gen: Pleasant, well-nourished, in no distress,  normal affect  ENT: No lesions,  mouth clear,  oropharynx clear, no postnasal drip. Purulent R nares  Neck: No JVD, no TMG, no carotid bruits  Lungs: No use of accessory muscles, no dullness to percussion, expir wheeze  Cardiovascular: RRR, heart sounds normal, no murmur or gallops, no peripheral edema  Abdomen: soft and NT, no HSM,  BS normal  Musculoskeletal: No deformities, no cyanosis or clubbing  Neuro: alert, non focal  Skin: Warm, no lesions or rashes  No results found.        Assessment & Plan:   Severe persistent asthma with chronic sinusitis Severe persistent asthma with sinusitis and bronchitis flare Plan Take factive one daily for 7days (use samples) PRednisone 20mg  Take 3 daily for 2 days then 2 daily for 2 days then 1 daily for two days and stop Hold 5mg  prednisone while on 20mg  pred All other meds  same Return 1 month     Updated Medication List Outpatient Encounter Prescriptions as of 05/25/2012  Medication Sig Dispense Refill  . ACCU-CHEK AVIVA PLUS test strip 1 each by Other route 2 (two) times a week.       [name] Kitchen acetaminophen (TYLENOL) 500 MG tablet Take 500 mg by mouth every 6 (six) hours as needed for pain or fever.      [name] Kitchen acyclovir (ZOVIRAX) 400 MG tablet Take 1 tablet twice daily  60 tablet  6  . albuterol (PROVENTIL HFA;VENTOLIN HFA) 108 (90 BASE) MCG/ACT inhaler Inhale 2 puffs into the lungs every 6 (six) hours as needed for wheezing or shortness of breath.      [name] Kitchen albuterol (PROVENTIL) (2.5 MG/3ML) 0.083% nebulizer solution Take 2.5 mg by nebulization every 4 (four)  hours as needed for wheezing or shortness of breath.      . benzonatate (TESSALON) 200 MG capsule Take 200 mg by mouth 3 (three) times daily as needed for cough.      . citalopram (CELEXA) 20 MG tablet Take 20 mg by mouth every morning.        . fexofenadine (ALLEGRA) 180 MG tablet Take 180 mg by mouth daily as needed.       . fluticasone (FLONASE) 50 MCG/ACT nasal spray Place 2 sprays into the nose 2 (two) times daily.  16 g  6  . furosemide (LASIX) 20 MG tablet Take 1 tablet by mouth every other day as needed. *Usually takes as needed for fluid retention upon weighing in**      . Lancets (ACCU-CHEK MULTICLIX) lancets 1 each by Other route 2 (two) times a week.       . mometasone-formoterol (DULERA) 200-5 MCG/ACT AERO Inhale 2 puffs into the lungs 2 (two) times daily.  1 Inhaler  5  . omeprazole (PRILOSEC) 20 MG capsule Take 1 capsule (20 mg total) by mouth 2 (two) times daily.  60 capsule  5  . predniSONE (DELTASONE) 5 MG tablet HOLD while on prednisone 20mg ,then resume when 20mg  finishes      . simvastatin (ZOCOR) 80 MG tablet Take 80 mg by mouth at bedtime.        [name] Kitchen warfarin (COUMADIN) 1 MG tablet Take 1-2 mg by mouth every evening. She takes one tablet with half of a 10mg  tablet to equal her 6mg  dose everyday except for on Saturday and Sunday. She takes two tablets with half of a 10mg  tablet to equal her dose of 7 mg on those two days.      [name] Kitchen warfarin (COUMADIN) 10 MG tablet Take 5 mg by mouth every evening. She takes with a 1 mg tablet to equal her dose of 6mg  everyday except for on Saturday and Sunday. She takes two 1mg  tablets to equal her dose of 7 mg on those two days.      . zafirlukast (ACCOLATE) 20 MG tablet Take 1 tablet (20 mg total) by mouth 2 (two) times daily.  60 tablet  5  . [DISCONTINUED] predniSONE (DELTASONE) 5 MG tablet Take 5 mg by mouth daily.      [name] Kitchen gemifloxacin (FACTIVE) 320 MG tablet Take 1 tablet (320 mg total) by mouth daily.  7 tablet  0  . predniSONE (DELTASONE) 20 MG  tablet Take 3  for two days three for two days two for two days one for two days then stope  15 tablet  0  . [DISCONTINUED] predniSONE (DELTASONE) 20 MG tablet Take 20 mg by mouth  daily.       Facility-Administered Encounter Medications as of 05/25/2012  Medication Dose Route Frequency Provider Last Rate Last Dose  . 0.9 %  sodium chloride infusion   Intravenous Continuous Randall An, MD 20 mL/hr at 05/12/11 0914    . Immune Globulin 5% (OCTAGAM) SOLN 10 g  10 g Intravenous Once [employer], PA-C      . Immune Globulin 5% (OCTAGAM) SOLN 70 g  70 g Intravenous Once Ellouise Newer, PA-C

## 2012-05-26 DIAGNOSIS — G4710 Hypersomnia, unspecified: Secondary | ICD-10-CM

## 2012-05-26 DIAGNOSIS — G4730 Sleep apnea, unspecified: Secondary | ICD-10-CM

## 2012-05-27 NOTE — Procedures (Signed)
[name], [name]                 ACCOUNT NO.:  [id number]  MEDICAL RECORD NO.:  [id number]          PATIENT TYPE:  OUT  LOCATION:  SLEEP CENTER                 FACILITY[facility]  [facility]  PHYSICIAN:  Barbaraann Share, MD,FCCPDATE OF BIRTH:  03-18[birth year]1955  DATE OF STUDY:  05/25/2012                           NOCTURNAL POLYSOMNOGRAM  REFERRING PHYSICIAN:  Rubye Oaks, NP  INDICATION FOR STUDY:  Hypersomnia with sleep apnea.  EPWORTH SLEEPINESS SCORE:  15.  SLEEP ARCHITECTURE:  The patient had a total sleep time of 361 minutes with very little slow-wave sleep or REM.  Sleep onset latency was normal at 18 minutes and REM onset was delayed at 133 minutes.  Sleep efficiency was 83% during the diagnostic portion of the study, and 88% during the titration portion.  RESPIRATORY DATA:  The patient underwent a split night protocol where she was found to have 41 obstructive events in the first 163 minutes of sleep.  This gave her an apnea-hypopnea index during the diagnostic portion of the study of 15 events per hour.  The events occurred in all body positions, and there was loud snoring noted throughout.  By protocol, she was then fitted with a medium ResMed Quattro full-face mask, and CPAP titration was initiated.  She appeared to have adequate control of her obstructive events at a pressure of 11 cm and increasing further only resulted in pressure-induced central apneas.  OXYGEN DATA:  There was O2 desaturation as low as 75% with the patient's obstructive events during REM.  CARDIAC DATA:  Occasional PAC noted, but no clinically significant arrhythmias were seen.  MOVEMENT-PARASOMNIA:  The patient had no significant leg jerks or other abnormal behaviors noted.  IMPRESSIONS-RECOMMENDATIONS: 1. Split night study reveals mild obstructive sleep apnea with an AHI     of 15 events per hour and oxygen desaturation as low as 75% during     the diagnostic portion of the study.  She was then fitted  with a     medium ResMed Quattro full-face mask, and found to have an optimal     pressure of 11 cm of water.  Given the mild nature of the patient's     sleep apnea, the decision to treat this aggressively should     primarily depend upon its impact to the patient's quality of life.     It represents very little increased cardiovascular risk.  Other     options besides CPAP for treatment include a trial of weight loss     alone, upper airway surgery, as well as a dental     appliance.  Clinical correlation is suggested. 2. Occasional premature atrial contraction noted, but no clinically     significant arrhythmias were seen.     Barbaraann Share, MD,FCCP Diplomate, American Board of Sleep Medicine    [facility]/MEDQ  D:  05/26/2012 16:42:48  T:  05/26/2012 20:50:26  Job:  [id number]

## 2012-05-29 ENCOUNTER — Encounter: Payer: Self-pay | Admitting: Adult Health

## 2012-05-29 ENCOUNTER — Telehealth: Payer: Self-pay | Admitting: Adult Health

## 2012-05-29 NOTE — Telephone Encounter (Signed)
Sleep study + mild OSA -improved with CPAP  Refer to sleep MD at [facility] to discuss tx options  LMOMTCB to discuss.  [name] Hickle NP - C

## 2012-05-30 NOTE — Telephone Encounter (Signed)
Pt returned call. Call her @ work [phone]. [name]

## 2012-05-31 NOTE — Telephone Encounter (Signed)
Called spoke with patient, advised of sleep study results / recs as stated by TP.  SLC scheduled with KC 6.19.14 @ 1145, pt aware to arrive early.  Pt denied any questions at this time; will sign off.

## 2012-06-08 ENCOUNTER — Telehealth: Payer: Self-pay | Admitting: Critical Care Medicine

## 2012-06-08 NOTE — Telephone Encounter (Signed)
I spoke with pt. She stated when she was admitted to the hospital from the office back in [facility] does not want to pay for this since this was not "pre authorized". She stated she contacted [facility] and was advised they have appealed the denial of payment but told her to call our office to see if we can preauthorize her HFU from February. She is going to call back with a # to call. Will await a call back.

## 2012-06-11 ENCOUNTER — Telehealth: Payer: Self-pay | Admitting: Critical Care Medicine

## 2012-06-11 ENCOUNTER — Encounter: Payer: Self-pay | Admitting: Pulmonary Disease

## 2012-06-11 MED ORDER — AMOXICILLIN-POT CLAVULANATE 875-125 MG PO TABS: 1.0000 | ORAL_TABLET | Freq: Two times a day (BID) | ORAL | Status: DC

## 2012-06-11 NOTE — Telephone Encounter (Signed)
Called spoke with patient Advised of MW's recs as stated below Pt okay with these recommendations and denied any further questions/concerns at this time Rx sent to verified pharmacy Nothing further needed at this time; will sign off.

## 2012-06-11 NOTE — Telephone Encounter (Signed)
augmentin 875 bid x 10 days  

## 2012-06-11 NOTE — Telephone Encounter (Signed)
Spoke with patient per 6.9.14 phone note She has yet to obtain the phone number for the preauthorization for the 02/2012 hospital adjustment Will continue to await pt's call back

## 2012-06-11 NOTE — Telephone Encounter (Signed)
I spoke with pt. She c/o cough w/ green phlem, drainage, nasal congestion, low grade fever, sore throat. She is requesting to have something called in since no available openings today with any doc. Since PW is scheduled off will forward to doc of the day. Please advise MW thanks  Allergies  Allergen Reactions  . Meperidine Hcl Anaphylaxis  . Montelukast Sodium Hives and Rash

## 2012-06-13 ENCOUNTER — Telehealth: Payer: Self-pay | Admitting: Critical Care Medicine

## 2012-06-13 NOTE — Telephone Encounter (Signed)
Error.[name] ° °

## 2012-06-13 NOTE — Telephone Encounter (Signed)
Pt returned call Apologized for the inconvenience and provided pt with the number to [employer] below.  Pt okay with this and verbalized her understanding.  She will call back if anything further is needed.  Will sign off.

## 2012-06-13 NOTE — Telephone Encounter (Signed)
LMOM TCB x1 Upon further research, we are unable to do hospital charges in the office.  Pt will need to call [employer] @ [phone]

## 2012-06-13 NOTE — Telephone Encounter (Signed)
Called, spoke with pt.   States she does have the # but it is at home. She isn't home at the moment and will call back to give [place] the #.

## 2012-06-13 NOTE — Telephone Encounter (Signed)
Pt states she was supposed to call back with a phone number for her insurance the number is [phone] and it's [employer], also says she had [employer] insurance at the time. Can be reached at [phone].[name]

## 2012-06-17 ENCOUNTER — Emergency Department (HOSPITAL_COMMUNITY): Payer: Medicare Other

## 2012-06-17 ENCOUNTER — Encounter (HOSPITAL_COMMUNITY): Payer: Self-pay | Admitting: Emergency Medicine

## 2012-06-17 ENCOUNTER — Inpatient Hospital Stay (HOSPITAL_COMMUNITY)
Admission: EM | Admit: 2012-06-17 | Discharge: 2012-06-19 | DRG: 871 | Disposition: A | Discharge status: Home / Self Care | Payer: Medicare Other | Attending: Family Medicine | Admitting: Family Medicine

## 2012-06-17 DIAGNOSIS — J45909 Unspecified asthma, uncomplicated: Secondary | ICD-10-CM

## 2012-06-17 DIAGNOSIS — J309 Allergic rhinitis, unspecified: Secondary | ICD-10-CM | POA: Diagnosis present

## 2012-06-17 DIAGNOSIS — A419 Sepsis, unspecified organism: Principal | ICD-10-CM

## 2012-06-17 DIAGNOSIS — J4540 Moderate persistent asthma, uncomplicated: Secondary | ICD-10-CM | POA: Diagnosis present

## 2012-06-17 DIAGNOSIS — J4542 Moderate persistent asthma with status asthmaticus: Secondary | ICD-10-CM

## 2012-06-17 DIAGNOSIS — Z79899 Other long term (current) drug therapy: Secondary | ICD-10-CM

## 2012-06-17 DIAGNOSIS — Z87898 Personal history of other specified conditions: Secondary | ICD-10-CM

## 2012-06-17 DIAGNOSIS — E875 Hyperkalemia: Secondary | ICD-10-CM | POA: Diagnosis present

## 2012-06-17 DIAGNOSIS — J329 Chronic sinusitis, unspecified: Secondary | ICD-10-CM | POA: Diagnosis present

## 2012-06-17 DIAGNOSIS — J189 Pneumonia, unspecified organism: Secondary | ICD-10-CM

## 2012-06-17 DIAGNOSIS — K219 Gastro-esophageal reflux disease without esophagitis: Secondary | ICD-10-CM | POA: Diagnosis present

## 2012-06-17 DIAGNOSIS — C8590 Non-Hodgkin lymphoma, unspecified, unspecified site: Secondary | ICD-10-CM | POA: Diagnosis present

## 2012-06-17 DIAGNOSIS — Z86718 Personal history of other venous thrombosis and embolism: Secondary | ICD-10-CM

## 2012-06-17 HISTORY — DX: Presence of other specified devices: Z97.8

## 2012-06-17 LAB — COMPREHENSIVE METABOLIC PANEL
ALT: 25 U/L (ref 0–35)
AST: 27 U/L (ref 0–37)
Albumin: 3.3 g/dL — ABNORMAL LOW (ref 3.5–5.2)
CO2: 30 mEq/L (ref 19–32)
Chloride: 97 mEq/L (ref 96–112)
GFR calc non Af Amer: 59 mL/min — ABNORMAL LOW (ref >90)
Sodium: 137 mEq/L (ref 135–145)
Total Bilirubin: 0.5 mg/dL (ref 0.3–1.2)

## 2012-06-17 LAB — CBC WITH DIFFERENTIAL/PLATELET
Basophils Absolute: 0 10*3/uL (ref 0.0–0.1)
Lymphocytes Relative: 7 % — ABNORMAL LOW (ref 12–46)
Neutro Abs: 6.4 10*3/uL (ref 1.7–7.7)
Neutrophils Relative %: 84 % — ABNORMAL HIGH (ref 43–77)
Platelets: 151 10*3/uL (ref 150–400)
RDW: 13.8 % (ref 11.5–15.5)
WBC: 7.7 10*3/uL (ref 4.0–10.5)

## 2012-06-17 MED ORDER — HYDROCOD POLST-CHLORPHEN POLST 10-8 MG/5ML PO LQCR: 5.0000 mL | Freq: Two times a day (BID) | ORAL | Status: DC | PRN

## 2012-06-17 MED ORDER — VANCOMYCIN HCL IN DEXTROSE 1-5 GM/200ML-% IV SOLN
1000.0000 mg | Freq: Two times a day (BID) | INTRAVENOUS | Status: DC
  Administered 2012-06-17 – 2012-06-19 (×4): 1000 mg via INTRAVENOUS
  Filled 2012-06-17 (×5): qty 200

## 2012-06-17 MED ORDER — METHYLPREDNISOLONE SODIUM SUCC 125 MG IJ SOLR
125.0000 mg | Freq: Once | INTRAMUSCULAR | Status: AC
  Administered 2012-06-17: 125 mg via INTRAVENOUS
  Filled 2012-06-17 (×2): qty 2

## 2012-06-17 MED ORDER — ALUM & MAG HYDROXIDE-SIMETH 200-200-20 MG/5ML PO SUSP: 30.0000 mL | Freq: Four times a day (QID) | ORAL | Status: DC | PRN

## 2012-06-17 MED ORDER — WARFARIN SODIUM 5 MG PO TABS
5.0000 mg | ORAL_TABLET | Freq: Once | ORAL | Status: AC
  Administered 2012-06-17: 5 mg via ORAL
  Filled 2012-06-17: qty 1

## 2012-06-17 MED ORDER — DEXTROSE 5 % IV SOLN
1.0000 g | Freq: Three times a day (TID) | INTRAVENOUS | Status: DC
  Administered 2012-06-17 – 2012-06-19 (×5): 1 g via INTRAVENOUS
  Filled 2012-06-17 (×6): qty 1

## 2012-06-17 MED ORDER — MOMETASONE FURO-FORMOTEROL FUM 200-5 MCG/ACT IN AERO
2.0000 | INHALATION_SPRAY | Freq: Two times a day (BID) | RESPIRATORY_TRACT | Status: DC
  Administered 2012-06-18 – 2012-06-19 (×4): 2 via RESPIRATORY_TRACT
  Filled 2012-06-17: qty 8.8

## 2012-06-17 MED ORDER — LORATADINE 10 MG PO TABS
10.0000 mg | ORAL_TABLET | Freq: Every day | ORAL | Status: DC
  Administered 2012-06-17 – 2012-06-19 (×3): 10 mg via ORAL
  Filled 2012-06-17 (×3): qty 1

## 2012-06-17 MED ORDER — PREDNISONE 10 MG PO TABS
5.0000 mg | ORAL_TABLET | Freq: Every day | ORAL | Status: DC
  Administered 2012-06-18 – 2012-06-19 (×2): 5 mg via ORAL
  Filled 2012-06-17 (×2): qty 1

## 2012-06-17 MED ORDER — ZAFIRLUKAST 20 MG PO TABS
20.0000 mg | ORAL_TABLET | Freq: Two times a day (BID) | ORAL | Status: DC
  Administered 2012-06-18 – 2012-06-19 (×2): 20 mg via ORAL
  Filled 2012-06-17 (×6): qty 1

## 2012-06-17 MED ORDER — ATORVASTATIN CALCIUM 40 MG PO TABS
40.0000 mg | ORAL_TABLET | Freq: Every day | ORAL | Status: DC
  Administered 2012-06-17 – 2012-06-18 (×2): 40 mg via ORAL
  Filled 2012-06-17 (×2): qty 1

## 2012-06-17 MED ORDER — ACYCLOVIR 400 MG PO TABS
400.0000 mg | ORAL_TABLET | Freq: Two times a day (BID) | ORAL | Status: DC
  Filled 2012-06-17 (×2): qty 1

## 2012-06-17 MED ORDER — CITALOPRAM HYDROBROMIDE 20 MG PO TABS
20.0000 mg | ORAL_TABLET | Freq: Every day | ORAL | Status: DC
  Administered 2012-06-17 – 2012-06-19 (×3): 20 mg via ORAL
  Filled 2012-06-17 (×3): qty 1

## 2012-06-17 MED ORDER — ALBUTEROL SULFATE (5 MG/ML) 0.5% IN NEBU
2.5000 mg | INHALATION_SOLUTION | Freq: Once | RESPIRATORY_TRACT | Status: AC
  Administered 2012-06-17: 2.5 mg via RESPIRATORY_TRACT
  Filled 2012-06-17: qty 0.5

## 2012-06-17 MED ORDER — ACETAMINOPHEN 325 MG PO TABS: 650.0000 mg | ORAL_TABLET | Freq: Four times a day (QID) | ORAL | Status: DC | PRN

## 2012-06-17 MED ORDER — DEXTROSE 5 % IV SOLN
2.0000 g | Freq: Once | INTRAVENOUS | Status: AC
  Administered 2012-06-17: 2 g via INTRAVENOUS
  Filled 2012-06-17: qty 2

## 2012-06-17 MED ORDER — ACETAMINOPHEN 650 MG RE SUPP: 650.0000 mg | Freq: Four times a day (QID) | RECTAL | Status: DC | PRN

## 2012-06-17 MED ORDER — ACYCLOVIR 200 MG PO CAPS
ORAL_CAPSULE | ORAL | Status: AC
  Filled 2012-06-17: qty 2

## 2012-06-17 MED ORDER — WARFARIN - PHARMACIST DOSING INPATIENT
Status: DC
  Administered 2012-06-18: 16:00:00

## 2012-06-17 MED ORDER — PANTOPRAZOLE SODIUM 40 MG PO TBEC
40.0000 mg | DELAYED_RELEASE_TABLET | Freq: Two times a day (BID) | ORAL | Status: DC
  Administered 2012-06-17 – 2012-06-19 (×4): 40 mg via ORAL
  Filled 2012-06-17 (×4): qty 1

## 2012-06-17 MED ORDER — ACYCLOVIR 200 MG PO CAPS
400.0000 mg | ORAL_CAPSULE | Freq: Two times a day (BID) | ORAL | Status: DC
  Administered 2012-06-17 – 2012-06-19 (×4): 400 mg via ORAL
  Filled 2012-06-17 (×3): qty 2

## 2012-06-17 MED ORDER — ONDANSETRON HCL 4 MG PO TABS: 4.0000 mg | ORAL_TABLET | Freq: Four times a day (QID) | ORAL | Status: DC | PRN

## 2012-06-17 MED ORDER — FLUTICASONE PROPIONATE 50 MCG/ACT NA SUSP
2.0000 | Freq: Two times a day (BID) | NASAL | Status: DC
  Administered 2012-06-17 – 2012-06-19 (×5): 2 via NASAL
  Filled 2012-06-17: qty 16

## 2012-06-17 MED ORDER — DEXTROSE 5 % IV SOLN
1.0000 g | Freq: Three times a day (TID) | INTRAVENOUS | Status: DC
  Filled 2012-06-17 (×4): qty 1

## 2012-06-17 MED ORDER — ALBUTEROL SULFATE (5 MG/ML) 0.5% IN NEBU
10.0000 mg | INHALATION_SOLUTION | Freq: Once | RESPIRATORY_TRACT | Status: AC
  Administered 2012-06-17: 10 mg via RESPIRATORY_TRACT
  Filled 2012-06-17: qty 2

## 2012-06-17 MED ORDER — ONDANSETRON HCL 4 MG/2ML IJ SOLN: 4.0000 mg | Freq: Four times a day (QID) | INTRAMUSCULAR | Status: DC | PRN

## 2012-06-17 MED ORDER — IPRATROPIUM BROMIDE 0.02 % IN SOLN
0.5000 mg | Freq: Once | RESPIRATORY_TRACT | Status: AC
  Administered 2012-06-17: 0.5 mg via RESPIRATORY_TRACT
  Filled 2012-06-17: qty 2.5

## 2012-06-17 NOTE — ED Provider Notes (Signed)
History     CSN: [id number]  Arrival date & time 06/17/12  1037   First MD Initiated Contact with Patient 06/17/12 1045      Chief Complaint  Patient presents with  . Shortness of Breath  . Cough    (Consider location/radiation/quality/duration/timing/severity/associated sxs/prior treatment) Patient is a 57 y.o. female presenting with shortness of breath and cough. The history is provided by the patient.  Shortness of Breath Severity:  Severe Onset quality:  Gradual Duration:  1 week Timing:  Intermittent Progression:  Worsening Context: URI   Context comment:  Hx of non Hodgkin's lymphoma, asthma, and chronic sinusitis Relieved by:  Nothing Ineffective treatments: steroids, augmentin, tessalon. Associated symptoms: cough, fever, headaches, sputum production and wheezing   Associated symptoms: no abdominal pain, no chest pain, no hemoptysis, no neck pain and no syncope   Risk factors: no recent alcohol use and no tobacco use   Cough Associated symptoms: fever, headaches, shortness of breath and wheezing   Associated symptoms: no chest pain and no eye discharge     Past Medical History  Diagnosis Date  . Non Hodgkin's lymphoma   . Allergic rhinitis   . GERD (gastroesophageal reflux disease)   . Asthma   . Chronic sinusitis   . Respiratory failure   . Cavitary lung disease   . PONV (postoperative nausea and vomiting)   . Endotracheally intubated     Past Surgical History  Procedure Laterality Date  . Vesicovaginal fistula closure w/ tah    . Nasal sinus surgery    . Neck surgery      x 2   . Breast surgery    . Portacath placement  7/11    Removed 6/12  . Basal cell carcinoma excision  03/2011    scalp  . Port-a-cath removal    . Peripherally inserted central catheter insertion    . Picc removal    . Tracheostomy      Family History  Problem Relation Age of Onset  . Emphysema Mother   . Allergies Father   . Asthma Father     as a child  . Leukemia  Maternal Grandmother   . Diabetes Brother   . Stroke Mother     History  Substance Use Topics  . Smoking status: Never Smoker   . Smokeless tobacco: Never Used  . Alcohol Use: No    OB History   Grav Para Term Preterm Abortions TAB SAB Ect Mult Living                  Review of Systems  Constitutional: Positive for fever, activity change and fatigue.       All ROS Neg except as noted in HPI  HENT: Negative for nosebleeds and neck pain.   Eyes: Negative for photophobia and discharge.  Respiratory: Positive for cough, sputum production, shortness of breath and wheezing. Negative for hemoptysis.   Cardiovascular: Negative for chest pain, palpitations and syncope.  Gastrointestinal: Negative for abdominal pain and blood in stool.  Genitourinary: Negative for dysuria, frequency and hematuria.  Musculoskeletal: Negative for back pain and joint swelling.  Skin: Negative.   Neurological: Positive for headaches. Negative for dizziness, seizures and speech difficulty.  Hematological: Bruises/bleeds easily.  Psychiatric/Behavioral: Negative for hallucinations and confusion.    Allergies  Meperidine hcl and Montelukast sodium  Home Medications   Current Outpatient Rx  Name  Route  Sig  Dispense  Refill  . ACCU-CHEK AVIVA PLUS test strip  Other   1 each by Other route 2 (two) times a week.          [name] Kitchen acetaminophen (TYLENOL) 500 MG tablet   Oral   Take 500 mg by mouth every 6 (six) hours as needed for pain or fever.         [name] Kitchen acyclovir (ZOVIRAX) 400 MG tablet      Take 1 tablet twice daily   60 tablet   6   . albuterol (PROVENTIL HFA;VENTOLIN HFA) 108 (90 BASE) MCG/ACT inhaler   Inhalation   Inhale 2 puffs into the lungs every 6 (six) hours as needed for wheezing or shortness of breath.         [name] Kitchen albuterol (PROVENTIL) (2.5 MG/3ML) 0.083% nebulizer solution   Nebulization   Take 2.5 mg by nebulization every 4 (four) hours as needed for wheezing or shortness of  breath.         [name] Kitchen amoxicillin-clavulanate (AUGMENTIN) 875-125 MG per tablet   Oral   Take 1 tablet by mouth 2 (two) times daily.   20 tablet   0   . benzonatate (TESSALON) 200 MG capsule   Oral   Take 200 mg by mouth 3 (three) times daily as needed for cough.         . citalopram (CELEXA) 20 MG tablet   Oral   Take 20 mg by mouth every morning.           . fexofenadine (ALLEGRA) 180 MG tablet   Oral   Take 180 mg by mouth daily as needed.          . fluticasone (FLONASE) 50 MCG/ACT nasal spray   Nasal   Place 2 sprays into the nose 2 (two) times daily.   16 g   6   . furosemide (LASIX) 20 MG tablet   Oral   Take 1 tablet by mouth every other day as needed. *Usually takes as needed for fluid retention upon weighing in**         . gemifloxacin (FACTIVE) 320 MG tablet   Oral   Take 1 tablet (320 mg total) by mouth daily.   7 tablet   0   . Lancets (ACCU-CHEK MULTICLIX) lancets   Other   1 each by Other route 2 (two) times a week.          . mometasone-formoterol (DULERA) 200-5 MCG/ACT AERO   Inhalation   Inhale 2 puffs into the lungs 2 (two) times daily.   1 Inhaler   5   . omeprazole (PRILOSEC) 20 MG capsule   Oral   Take 1 capsule (20 mg total) by mouth 2 (two) times daily.   60 capsule   5   . predniSONE (DELTASONE) 20 MG tablet      Take 3  for two days three for two days two for two days one for two days then stope   15 tablet   0   . predniSONE (DELTASONE) 5 MG tablet      HOLD while on prednisone 20mg ,then resume when 20mg  finishes         . simvastatin (ZOCOR) 80 MG tablet   Oral   Take 80 mg by mouth at bedtime.           [name] Kitchen warfarin (COUMADIN) 1 MG tablet   Oral   Take 1-2 mg by mouth every evening. She takes one tablet with half of a 10mg  tablet to equal her 6mg  dose everyday except  for on Saturday and Sunday. She takes two tablets with half of a 10mg  tablet to equal her dose of 7 mg on those two days.         [name] Kitchen warfarin  (COUMADIN) 10 MG tablet   Oral   Take 5 mg by mouth every evening. She takes with a 1 mg tablet to equal her dose of 6mg  everyday except for on Saturday and Sunday. She takes two 1mg  tablets to equal her dose of 7 mg on those two days.         . zafirlukast (ACCOLATE) 20 MG tablet   Oral   Take 1 tablet (20 mg total) by mouth 2 (two) times daily.   60 tablet   5     SpO2 92%  Physical Exam  Nursing note and vitals reviewed. Constitutional: She is oriented to person, place, and time. She appears well-developed and well-nourished.  Non-toxic appearance.  HENT:  Head: Normocephalic.  Right Ear: Tympanic membrane and external ear normal.  Left Ear: Tympanic membrane and external ear normal.  Eyes: EOM and lids are normal. Pupils are equal, round, and reactive to light.  Neck: Normal range of motion. Neck supple. Carotid bruit is not present.  Cardiovascular: Normal rate, regular rhythm, normal heart sounds, intact distal pulses and normal pulses.   Pulmonary/Chest: Tachypnea noted. No respiratory distress. She has wheezes. She has rhonchi.  Abdominal: Soft. Bowel sounds are normal. There is no tenderness. There is no guarding.  Musculoskeletal: Normal range of motion.  Lymphadenopathy:       Head (right side): No submandibular adenopathy present.       Head (left side): No submandibular adenopathy present.    She has no cervical adenopathy.  Neurological: She is alert and oriented to person, place, and time. She has normal strength. No cranial nerve deficit or sensory deficit.  Skin: Skin is warm and dry.  Psychiatric: She has a normal mood and affect. Her speech is normal.    ED Course  Procedures (including critical care time)  Labs Reviewed  CBC WITH DIFFERENTIAL  COMPREHENSIVE METABOLIC PANEL   No results found. Pulse ox 92% on room air. 95% on 2l/min. WNL by my interpretation.  No diagnosis found.    MDM  I have reviewed nursing notes, vital signs, and all  appropriate lab and imaging results for this patient. I requested a rectal temp, and it was found to be 103.  Chest xray reveals  Increase streaky opacity in the left lower lobe, suspicious for pneumonia. The albumin is low at 3.3, and the glucose is elevated at 111. The comprehensive met. Panel is otherwise wnl. The INR is 2,28, in therapeutic range.  The WBC is wnl at 7.7.   The patient has wheezes and rhonchi on exam. She states she feels bad. She has a rectal temp of 103, has failed outpatient therapy, and has x-ray suspicious for pneumonia. Patient seen with me by Dr. Bebe Shaggy. Patient to be considered for admission.       Kathie Dike, PA-C 06/17/12 1352  Kathie Dike, PA-C 06/17/12 [id number]

## 2012-06-17 NOTE — ED Provider Notes (Signed)
Patient seen/examined in the Emergency Department in conjunction with Midlevel Provider [facility] Patient reports shortness of breath Exam : tachypneic and coarse wheezing bilaterally.  She is able to speak to me Plan: continue nebs, CXR is pending, she may require admission given her previous history    Date: 06/17/2012 1113am  Rate: 109  Rhythm: sinus tachycardia  QRS Axis: normal  Intervals: normal  ST/T Wave abnormalities: nonspecific ST changes  Conduction Disutrbances:none  Narrative Interpretation:   Old EKG Reviewed: unchanged       Joya Gaskins, MD 06/17/12 1133

## 2012-06-17 NOTE — Progress Notes (Signed)
ANTIBIOTIC CONSULT NOTE - INITIAL  Pharmacy Consult for Vancomycin & Cefepime Indication: pneumonia  Allergies  Allergen Reactions  . Meperidine Hcl Anaphylaxis  . Montelukast Sodium Hives and Rash    Patient Measurements:  Patient weight approximately 78 kg   Vital Signs: Temp: 103.4 F (39.7 C) (06/15 1142) Temp src: Rectal (06/15 1142) BP: 141/71 mmHg (06/15 1135) Pulse Rate: 102 (06/15 1135) Intake/Output from previous day:   Intake/Output from this shift:    Labs:  Recent Labs  06/17/12 1051  WBC 7.7  HGB 12.4  PLT 151  CREATININE 1.03   The CrCl is unknown because both a height and weight (above a minimum accepted value) are required for this calculation. No results found for this basename: VANCOTROUGH, VANCOPEAK, VANCORANDOM, GENTTROUGH, GENTPEAK, GENTRANDOM, TOBRATROUGH, TOBRAPEAK, TOBRARND, AMIKACINPEAK, AMIKACINTROU, AMIKACIN,  in the last 72 hours   Microbiology: No results found for this or any previous visit (from the past 720 hour(s)).  Medical History: Past Medical History  Diagnosis Date  . Non Hodgkin's lymphoma   . Allergic rhinitis   . GERD (gastroesophageal reflux disease)   . Asthma   . Chronic sinusitis   . Respiratory failure   . Cavitary lung disease   . PONV (postoperative nausea and vomiting)   . Endotracheally intubated     Medications:  Scheduled:  Assessment: SCR 1.03 Calculated CrCl > 60 ml/min Cefepime 2 GM IV given in ED  Goal of Therapy:  Vancomycin trough level 15-20 mcg/ml  Plan:  Vancomycin 1 GM IV every 12 hours Cefepime 1 GM IV every 8 hours Vancomycin trough at steady state Monitor renal function Labs per protocol  Raquel James, Ranata Laughery Bennett 06/17/2012,2:15 PM

## 2012-06-17 NOTE — ED Provider Notes (Signed)
Pt improved but still requiring nebs She is high risk for failure given she has sepsis, h/o intubation previously, and h/o tracheostomy previously She also has pneumonia but she is on chronic daily steroids and has failed outpatient antibiotics This would be healthcare associated pneumonia  Pt admitted for health care associated pneumonia, status asthmatic, sepsis  She has required multiple nebs and risk for deterioration is high I discussed case with dr Irene Limbo will admit patient to hospital  CRITICAL CARE Performed by: Joya Gaskins Total critical care time: 31 Critical care time was exclusive of separately billable procedures and treating other patients. Critical care was necessary to treat or prevent imminent or life-threatening deterioration. Critical care was time spent personally by me on the following activities: development of treatment plan with patient and/or surrogate as well as nursing, discussions with consultants, evaluation of patient's response to treatment, examination of patient, obtaining history from patient or surrogate, ordering and performing treatments and interventions, ordering and review of laboratory studies, ordering and review of radiographic studies, pulse oximetry and re-evaluation of patient's condition.   Joya Gaskins, MD 06/17/12 1240

## 2012-06-17 NOTE — ED Notes (Signed)
States that she started having shortness of breath, fever and cough x1 week ago, states that her PCP started her on Augmentin on Monday without problem.

## 2012-06-17 NOTE — ED Provider Notes (Signed)
Medical screening examination/treatment/procedure(s) were conducted as a shared visit with non-physician practitioner(s) and myself.  I personally evaluated the patient during the encounter   Joya Gaskins, MD 06/17/12 1444

## 2012-06-17 NOTE — ED Notes (Signed)
Pt reports increased difficulty breathing and intermittent fever over the past week. Pt also reports productive cough with "yellow-green" sputum. Pt states she has been using home nebulizer with "some" relief. Pt also reports intermittent lightheadedness but denies dizziness, weakness.

## 2012-06-17 NOTE — Progress Notes (Signed)
ANTICOAGULATION CONSULT NOTE - Initial Consult  Pharmacy Consult for Warfarin Indication: VTE treatment, h/o DVT  Allergies  Allergen Reactions  . Meperidine Hcl Anaphylaxis  . Montelukast Sodium Hives and Rash    Patient Measurements:     Vital Signs: Temp: 103.4 F (39.7 C) (06/15 1142) Temp src: Rectal (06/15 1142) BP: 141/71 mmHg (06/15 1135) Pulse Rate: 102 (06/15 1135)  Labs:  Recent Labs  06/17/12 1051 06/17/12 1131  HGB 12.4  --   HCT 37.7  --   PLT 151  --   LABPROT  --  24.1*  INR  --  2.28*  CREATININE 1.03  --     The CrCl is unknown because both a height and weight (above a minimum accepted value) are required for this calculation.   Medical History: Past Medical History  Diagnosis Date  . Non Hodgkin's lymphoma   . Allergic rhinitis   . GERD (gastroesophageal reflux disease)   . Asthma   . Chronic sinusitis   . Respiratory failure   . Cavitary lung disease   . PONV (postoperative nausea and vomiting)   . Endotracheally intubated     Medications:  Scheduled:   Assessment: Continuation of Warfarin from home PTA Warfarin 5 mg po daily INR therapeutic on admission  Goal of Therapy:  INR 2-3 Monitor platelets by anticoagulation protocol: Yes   Plan:  Coumadin 5 mg po today INR/PT daily CBC, monitor platelets Labs per protocol  Raquel James, Delicia Berens Bennett 06/17/2012,2:12 PM

## 2012-06-17 NOTE — H&P (Signed)
History and Physical  [name] ZOX:[id number] DOB: April 20, 1955 DOA: 06/17/2012  Referring physician: Zadie Rhine, MD PCP: Kirk Ruths, MD   Chief Complaint: short of breath  HPI:  57 year old woman presented emergency department with increasing shortness of breath and fever. Initial evaluation suggested pneumonia, possible early sepsis and given complex medical history patient was referred for admission.  Patient reports symptoms began 7 days ago with increased shortness of breath, fever and increased cough. 6/9 patient called her pulmonologist office and was started on Augmentin 875 mg twice a day. Despite taking this medication her symptoms continued to worsen with increasing dyspnea on exertion, cough to the point where she needs to rest after combing her hair. Because of the progression of her symptoms over time she came to the emergency department. There has been no acute decline in her state in the past 24 hours.  She has a complex pulmonary history is followed by pulmonology for severe persistent asthma with sinusitis. She uses oxygen at night. She is a history of respiratory failure requiring tracheostomy in the past. She has a history of non-Hodgkin's lymphoma.  In the emergency department temperature was 103.4. Heart rate less than 120, normotensive, minimal hypoxia. Screening laboratory studies were unremarkable. Chest x-ray suggested pneumonia. She was started on cefepime.  Review of Systems:  Positive for fever, chills, sore throat from coughing, normal appetite and food intake.  Negative for visual changes, rash, new muscle aches, chest pain, dysuria, bleeding, nausea, vomiting, abdominal pain.   Past Medical History  Diagnosis Date  . Non Hodgkin's lymphoma   . Allergic rhinitis   . GERD (gastroesophageal reflux disease)   . Asthma   . Chronic sinusitis   . Respiratory failure   . Cavitary lung disease   . PONV (postoperative nausea and vomiting)   .  Endotracheally intubated     Past Surgical History  Procedure Laterality Date  . Vesicovaginal fistula closure w/ tah    . Nasal sinus surgery    . Neck surgery      x 2   . Breast surgery    . Portacath placement  7/11    Removed 6/12  . Basal cell carcinoma excision  03/2011    scalp  . Port-a-cath removal    . Peripherally inserted central catheter insertion    . Picc removal    . Tracheostomy    . Abdominal hysterectomy    . Lung lobectomy      right    Social History:  reports that she has never smoked. She has never used smokeless tobacco. She reports that she does not drink alcohol or use illicit drugs.  Allergies  Allergen Reactions  . Meperidine Hcl Anaphylaxis  . Montelukast Sodium Hives and Rash    Family History  Problem Relation Age of Onset  . Emphysema Mother   . Allergies Father   . Asthma Father     as a child  . Leukemia Maternal Grandmother   . Diabetes Brother   . Stroke Mother      Prior to Admission medications   Medication Sig Start Date End Date Taking? Authorizing Provider  ACCU-CHEK AVIVA PLUS test strip 1 each by Other route 2 (two) times a week.    Yes Historical Provider, MD  acetaminophen (TYLENOL) 500 MG tablet Take 500 mg by mouth every 6 (six) hours as needed for pain or fever.   Yes Historical Provider, MD  acyclovir (ZOVIRAX) 400 MG tablet Take 1 tablet twice daily  09/06/11  Yes Randall An, MD  albuterol (PROVENTIL HFA;VENTOLIN HFA) 108 (90 BASE) MCG/ACT inhaler Inhale 2 puffs into the lungs every 6 (six) hours as needed for wheezing or shortness of breath.   Yes Historical Provider, MD  albuterol (PROVENTIL) (2.5 MG/3ML) 0.083% nebulizer solution Take 2.5 mg by nebulization every 4 (four) hours as needed for wheezing or shortness of breath.   Yes Historical Provider, MD  amoxicillin-clavulanate (AUGMENTIN) 875-125 MG per tablet Take 1 tablet by mouth 2 (two) times daily. 06/11/12  Yes Nyoka Cowden, MD  benzonatate (TESSALON)  200 MG capsule Take 200 mg by mouth 3 (three) times daily as needed for cough.   Yes Historical Provider, MD  citalopram (CELEXA) 20 MG tablet Take 20 mg by mouth every morning.     Yes Historical Provider, MD  fexofenadine (ALLEGRA) 180 MG tablet Take 180 mg by mouth daily as needed.    Yes Historical Provider, MD  fluticasone (FLONASE) 50 MCG/ACT nasal spray Place 2 sprays into the nose 2 (two) times daily. 02/16/12  Yes Storm Frisk, MD  furosemide (LASIX) 20 MG tablet Take 1 tablet by mouth every other day as needed. *Usually takes as needed for fluid retention upon weighing in**   Yes Historical Provider, MD  Lancets (ACCU-CHEK MULTICLIX) lancets 1 each by Other route 2 (two) times a week.    Yes Historical Provider, MD  mometasone-formoterol (DULERA) 200-5 MCG/ACT AERO Inhale 2 puffs into the lungs 2 (two) times daily. 03/19/12  Yes Tammy S Parrett, NP  omeprazole (PRILOSEC) 20 MG capsule Take 1 capsule (20 mg total) by mouth 2 (two) times daily. 03/19/12  Yes Tammy S Parrett, NP  predniSONE (DELTASONE) 5 MG tablet HOLD while on prednisone 20mg ,then resume when 20mg  finishes 05/25/12  Yes Storm Frisk, MD  simvastatin (ZOCOR) 80 MG tablet Take 80 mg by mouth at bedtime.     Yes Historical Provider, MD  warfarin (COUMADIN) 10 MG tablet Take 5 mg by mouth daily.    Yes Historical Provider, MD  zafirlukast (ACCOLATE) 20 MG tablet Take 1 tablet (20 mg total) by mouth 2 (two) times daily. 02/24/12  Yes Storm Frisk, MD   Physical Exam: Filed Vitals:   06/17/12 1130 06/17/12 1135 06/17/12 1142 06/17/12 1156  BP:  141/71    Pulse: 113 102    Temp:   103.4 F (39.7 C)   TempSrc:   Rectal   Resp:  22    SpO2: 95% 96%  95%    General:  Appears calm and comfortable. Speech fluent and clear. Nontoxic. Psychiatric: Grossly normal mood and affect. Eyes: Pupils, irises, lids appear grossly normal ENT hearing grossly normal. Lips and tongue appear normal. Hard and soft palate appear  normal. Neck no lymphadenopathy or masses. No thyromegaly. Cardiovascular regular rate and rhythm. No murmur, rub, gallop. No significant lower extremity edema. Respiratory bilateral expiratory wheeze, fair air movement, mild increased respiratory effort. No rhonchi or rales. Able to speak in full sentences. Abdomen soft, nontender, nondistended Skin no rash or acute lesions seen or induration Musculoskeletal grossly normal tone in upper and lower extremities Neurologic grossly normal  Wt Readings from Last 3 Encounters:  05/25/12 78.019 kg (172 lb)  05/25/12 81.012 kg (178 lb 9.6 oz)  04/16/12 80.831 kg (178 lb 3.2 oz)    Labs on Admission:  Basic Metabolic Panel:  Recent Labs Lab 06/17/12 1051  NA 137  K 3.7  CL 97  CO2 30  GLUCOSE  111*  BUN 14  CREATININE 1.03  CALCIUM 9.4    Liver Function Tests:  Recent Labs Lab 06/17/12 1051  AST 27  ALT 25  ALKPHOS 106  BILITOT 0.5  PROT 6.9  ALBUMIN 3.3*    CBC:  Recent Labs Lab 06/17/12 1051  WBC 7.7  NEUTROABS 6.4  HGB 12.4  HCT 37.7  MCV 93.1  PLT 151     Radiological Exams on Admission: Dg Chest 2 View  06/17/2012   *RADIOLOGY REPORT*  Clinical Data: Shortness of breath.  Asthma.  Non-Hodgkins lymphoma  CHEST - 2 VIEW  Comparison: 03/19/2012  Findings: Mild streaky opacity is seen in the left lower lobe which has increased since previous study, and suspicious for pneumonia. Right lung is clear.  No evidence of pleural effusion.  Heart size is normal.  IMPRESSION: Increase streaky opacity in the left lower lobe, suspicious for pneumonia.   Original Report Authenticated By: Myles Rosenthal, M.D.    EKG: Independently reviewed. Sinus rhythm, no acute changes   Principal Problem:   Pneumonia Active Problems:   NON-HODGKIN'S LYMPHOMA   ALLERGIC RHINITIS   Severe persistent asthma with chronic sinusitis   Sepsis   Assessment/Plan 1. Pneumonia: With possible early sepsis. Consider healthcare associated given  chronic comorbidities patient is at risk for drug-resistant organisms and has failed outpatient Augmentin. However, appears clinically stable for admission to the medical floor at this point. Empiric antibiotic therapy. Given history of intubation and tracheostomy in the past as well as severe persistent asthma chronic sinusitis monitor closely. 2. Severe persistent asthma with chronic sinusitis: Appears stable. Continue delay are, prednisone, Accolate. Nebulizers as needed. Continue nighttime oxygen at 2 L. 3. History of DVT: INR therapeutic. Continue warfarin. 4. History of non-Hodgkin's lymphoma   Code Status: Full code Family Communication: Discussed with sister and daughter at bedside Disposition Plan/Anticipated LOS: Admit, 2-4 days  Time spent: 50 minutes  Brendia Sacks, MD  [facility] Pager [phone] 06/17/2012, 1:41 PM

## 2012-06-18 LAB — PROTIME-INR
INR: 2.5 — ABNORMAL HIGH (ref 0.00–1.49)
Prothrombin Time: 25.8 seconds — ABNORMAL HIGH (ref 11.6–15.2)

## 2012-06-18 LAB — CBC
Platelets: 179 10*3/uL (ref 150–400)
RBC: 3.76 MIL/uL — ABNORMAL LOW (ref 3.87–5.11)
RDW: 13.8 % (ref 11.5–15.5)
WBC: 12.7 10*3/uL — ABNORMAL HIGH (ref 4.0–10.5)

## 2012-06-18 LAB — BASIC METABOLIC PANEL
CO2: 28 mEq/L (ref 19–32)
Calcium: 9.5 mg/dL (ref 8.4–10.5)
Creatinine, Ser: 0.93 mg/dL (ref 0.50–1.10)
GFR calc Af Amer: 78 mL/min — ABNORMAL LOW (ref >90)
GFR calc non Af Amer: 67 mL/min — ABNORMAL LOW (ref >90)
Sodium: 132 mEq/L — ABNORMAL LOW (ref 135–145)

## 2012-06-18 MED ORDER — ALBUTEROL SULFATE (5 MG/ML) 0.5% IN NEBU
2.5000 mg | INHALATION_SOLUTION | RESPIRATORY_TRACT | Status: DC
  Administered 2012-06-18 – 2012-06-19 (×4): 2.5 mg via RESPIRATORY_TRACT
  Filled 2012-06-18 (×4): qty 0.5

## 2012-06-18 MED ORDER — SODIUM POLYSTYRENE SULFONATE 15 GM/60ML PO SUSP
15.0000 g | Freq: Once | ORAL | Status: AC
  Administered 2012-06-18: 15 g via ORAL
  Filled 2012-06-18: qty 60

## 2012-06-18 MED ORDER — WARFARIN SODIUM 5 MG PO TABS
5.0000 mg | ORAL_TABLET | Freq: Once | ORAL | Status: AC
  Administered 2012-06-18: 5 mg via ORAL
  Filled 2012-06-18: qty 1

## 2012-06-18 MED ORDER — ALBUTEROL SULFATE (5 MG/ML) 0.5% IN NEBU
2.5000 mg | INHALATION_SOLUTION | RESPIRATORY_TRACT | Status: DC | PRN
  Administered 2012-06-18: 2.5 mg via RESPIRATORY_TRACT
  Filled 2012-06-18: qty 0.5

## 2012-06-18 NOTE — Progress Notes (Signed)
ANTICOAGULATION CONSULT NOTE   Pharmacy Consult for Warfarin Indication: VTE treatment, h/o DVT  Allergies  Allergen Reactions  . Meperidine Hcl Anaphylaxis  . Montelukast Sodium Hives and Rash   Patient Measurements: Height: 5\' 2"  (157.5 cm) Weight: 182 lb 14.4 oz (82.963 kg) IBW/kg (Calculated) : 50.1  Vital Signs: Temp: 98.9 F (37.2 C) (06/16 0419) Temp src: Oral (06/16 0419) BP: 122/74 mmHg (06/16 0419) Pulse Rate: 65 (06/16 0419)  Labs:  Recent Labs  06/17/12 1051 06/17/12 1131 06/18/12 0513  HGB 12.4  --  11.4*  HCT 37.7  --  34.9*  PLT 151  --  179  LABPROT  --  24.1* 25.8*  INR  --  2.28* 2.50*  CREATININE 1.03  --  0.93   Estimated Creatinine Clearance: 66.7 ml/min (by C-G formula based on Cr of 0.93).  Medical History: Past Medical History  Diagnosis Date  . Non Hodgkin's lymphoma   . Allergic rhinitis   . GERD (gastroesophageal reflux disease)   . Asthma   . Chronic sinusitis   . Respiratory failure   . Cavitary lung disease   . PONV (postoperative nausea and vomiting)   . Endotracheally intubated    Medications: Medications Prior to Admission  Medication Sig Dispense Refill  . ACCU-CHEK AVIVA PLUS test strip 1 each by Other route 2 (two) times a week.       [name] Kitchen acetaminophen (TYLENOL) 500 MG tablet Take 500 mg by mouth every 6 (six) hours as needed for pain or fever.      [name] Kitchen acyclovir (ZOVIRAX) 400 MG tablet Take 1 tablet twice daily  60 tablet  6  . albuterol (PROVENTIL HFA;VENTOLIN HFA) 108 (90 BASE) MCG/ACT inhaler Inhale 2 puffs into the lungs every 6 (six) hours as needed for wheezing or shortness of breath.      [name] Kitchen albuterol (PROVENTIL) (2.5 MG/3ML) 0.083% nebulizer solution Take 2.5 mg by nebulization every 4 (four) hours as needed for wheezing or shortness of breath.      [name] Kitchen amoxicillin-clavulanate (AUGMENTIN) 875-125 MG per tablet Take 1 tablet by mouth 2 (two) times daily.  20 tablet  0  . benzonatate (TESSALON) 200 MG capsule Take 200 mg  by mouth 3 (three) times daily as needed for cough.      . citalopram (CELEXA) 20 MG tablet Take 20 mg by mouth every morning.        . fexofenadine (ALLEGRA) 180 MG tablet Take 180 mg by mouth daily as needed.       . fluticasone (FLONASE) 50 MCG/ACT nasal spray Place 2 sprays into the nose 2 (two) times daily.  16 g  6  . furosemide (LASIX) 20 MG tablet Take 1 tablet by mouth every other day as needed. *Usually takes as needed for fluid retention upon weighing in**      . Lancets (ACCU-CHEK MULTICLIX) lancets 1 each by Other route 2 (two) times a week.       . mometasone-formoterol (DULERA) 200-5 MCG/ACT AERO Inhale 2 puffs into the lungs 2 (two) times daily.  1 Inhaler  5  . omeprazole (PRILOSEC) 20 MG capsule Take 1 capsule (20 mg total) by mouth 2 (two) times daily.  60 capsule  5  . predniSONE (DELTASONE) 5 MG tablet HOLD while on prednisone 20mg ,then resume when 20mg  finishes      . simvastatin (ZOCOR) 80 MG tablet Take 80 mg by mouth at bedtime.        [name] Kitchen warfarin (COUMADIN) 10 MG tablet  Take 5 mg by mouth daily.       . zafirlukast (ACCOLATE) 20 MG tablet Take 1 tablet (20 mg total) by mouth 2 (two) times daily.  60 tablet  5    Assessment: Continuation of Warfarin from home PTA Warfarin 5 mg po daily INR therapeutic  Goal of Therapy:  INR 2-3 Monitor platelets by anticoagulation protocol: Yes   Plan:  Coumadin 5 mg po today x1 INR/PT daily CBC, monitor platelets Labs per protocol  Valrie Hart A 06/18/2012,10:32 AM

## 2012-06-18 NOTE — Progress Notes (Signed)
Changed neb treatments to q4w/a pt wheezing and coughing up sputum

## 2012-06-18 NOTE — Progress Notes (Addendum)
[facility] PROGRESS NOTE  KRYSTAL TEACHEY NFA:[id number] DOB: 06-14-1955 DOA: 06/17/2012 PCP: Kirk Ruths, MD  Assessment/Plan: 1. Pneumonia with possible early sepsis: Clinically much improved. Considered to be healthcare associated given chronic comorbidities and recent treatment with Augmentin. Patient at risk for drug-resistant organisms and failed outpatient treatment, however she is improving rapidly on empiric antibiotics. 2. Severe persistent asthma with chronic sinusitis: Appears stable. Continue Dulera, prednisone, Accolate. Nebulizers as needed. Continue nighttime oxygen at 2 L. 3. History of DVT: INR therapeutic. Continue warfarin per pharmacy. 4. History of non-Hodgkin's lymphoma 5. Hyperkalemia and suspected to be spurious: However will treat with one dose of Kayexalate.   Continue current antibiotic therapy. Consider narrowing to monotherapy 6/17.  Repeat basic metabolic panel in the morning  Code Status: Full code DVT prophylaxis: Warfarin Family Communication: None present Disposition Plan: Likely home within 30 hours  Brendia Sacks, MD  [facility]  Pager [phone] If 7PM-7AM, please contact night-coverage at [email or web address], password [facility] 06/18/2012, 9:35 AM  LOS: 1 day   Clinical Summary: 58 year old woman presented emergency department with increasing shortness of breath and fever. Initial evaluation suggested pneumonia, possible early sepsis and given complex medical history patient was referred for admission.  Consultants:  None  Procedures:  None  Antibiotics:  Cefepime 6/16  >>   IV vancomycin 6/16 >>   HPI/Subjective: Feels much better today. Slept okay. No nausea, vomiting or abdominal pain. Breathing better today.  Objective: Filed Vitals:   06/17/12 2114 06/18/12 0018 06/18/12 0419 06/18/12 0630  BP: 114/71  122/74   Pulse: 70  65   Temp: 99.1 F (37.3 C)  98.9 F (37.2 C)   TempSrc: Oral  Oral   Resp: 22  22    Height:      Weight:      SpO2: 92% 91% 96% 94%    Intake/Output Summary (Last 24 hours) at 06/18/12 0935 Last data filed at 06/17/12 2100  Gross per 24 hour  Intake    240 ml  Output      0 ml  Net    240 ml     Filed Weights   06/17/12 1700  Weight: 82.963 kg (182 lb 14.4 oz)    Exam:   General: Appears much better today. Calm and comfortable in appearance. Sitting on side of bed eating breakfast.  Cardiovascular regular rate and rhythm. No murmur, rub, gallop.  Respiratory improved air movement, although the wheezing has resolved. Few crackles bilaterally, especially posteriorly. Normal respiratory effort.  Psychiatric grossly normal mood and affect. Speech fluent and appropriate.  Data Reviewed:  Potassium 5.3,, chloride 67. Suspect spurious results. Repeat study in the morning. Mild leukocytosis after administration of steroids. Minor stable 2.50.  Pending studies:   Legionella antigen  Strep pneumo antigen  Sputum culture if can be obtained  Scheduled Meds: . acyclovir  400 mg Oral BID  . albuterol  2.5 mg Nebulization Q4H WA  . atorvastatin  40 mg Oral q1800  . ceFEPime (MAXIPIME) IV  1 g Intravenous Q8H  . citalopram  20 mg Oral Daily  . fluticasone  2 spray Each Nare BID  . loratadine  10 mg Oral Daily  . mometasone-formoterol  2 puff Inhalation BID  . pantoprazole  40 mg Oral BID  . predniSONE  5 mg Oral Q breakfast  . vancomycin  1,000 mg Intravenous Q12H  . Warfarin - Pharmacist Dosing Inpatient   Does not apply Q24H  . zafirlukast  20 mg Oral BID  Continuous Infusions:   Principal Problem:   Pneumonia Active Problems:   NON-HODGKIN'S LYMPHOMA   ALLERGIC RHINITIS   Severe persistent asthma with chronic sinusitis   Sepsis   Time spent 15 minutes

## 2012-06-18 NOTE — Care Management Note (Signed)
    Page 1 of 1   06/19/2012     12:01:06 PM   CARE MANAGEMENT NOTE 06/19/2012  Patient:  [name], [name]   Account Number:  [id number]  Date Initiated:  06/18/2012  Documentation initiated by:  Rosemary Holms  Subjective/Objective Assessment:   Pt admitted from home where she lives alone. Uses O2 at home at night. States she feels better and does not anticipate HH needs.     Action/Plan:   Anticipated DC Date:  06/20/2012   Anticipated DC Plan:  HOME/SELF CARE      DC Planning Services  CM consult      Choice offered to / List presented to:             Status of service:  Completed, signed off Medicare Important Message given?  NA - LOS <3 / Initial given by admissions (If response is "NO", the following Medicare IM given date fields will be blank) Date Medicare IM given:   Date Additional Medicare IM given:    Discharge Disposition:  HOME/SELF CARE  Per UR Regulation:    If discussed at Long Length of Stay Meetings, dates discussed:    Comments:  06/18/12 Rosemary Holms RN BSN CM

## 2012-06-18 NOTE — Progress Notes (Signed)
Utilization Review Complete  

## 2012-06-19 LAB — LEGIONELLA ANTIGEN, URINE: Legionella Antigen, Urine: NEGATIVE

## 2012-06-19 LAB — BASIC METABOLIC PANEL
BUN: 18 mg/dL (ref 6–23)
Creatinine, Ser: 0.94 mg/dL (ref 0.50–1.10)
GFR calc Af Amer: 77 mL/min — ABNORMAL LOW (ref >90)
GFR calc non Af Amer: 66 mL/min — ABNORMAL LOW (ref >90)
Glucose, Bld: 111 mg/dL — ABNORMAL HIGH (ref 70–99)
Potassium: 3.1 mEq/L — ABNORMAL LOW (ref 3.5–5.1)

## 2012-06-19 LAB — VANCOMYCIN, TROUGH: Vancomycin Tr: 15.1 ug/mL (ref 10.0–20.0)

## 2012-06-19 LAB — PROTIME-INR: Prothrombin Time: 32.2 seconds — ABNORMAL HIGH (ref 11.6–15.2)

## 2012-06-19 MED ORDER — LEVOFLOXACIN 750 MG PO TABS: 750.0000 mg | ORAL_TABLET | Freq: Every day | ORAL | Status: DC

## 2012-06-19 MED ORDER — POTASSIUM CHLORIDE CRYS ER 20 MEQ PO TBCR
40.0000 meq | EXTENDED_RELEASE_TABLET | ORAL | Status: DC
  Administered 2012-06-19: 40 meq via ORAL
  Filled 2012-06-19: qty 2

## 2012-06-19 MED ORDER — LEVOFLOXACIN 750 MG PO TABS
750.0000 mg | ORAL_TABLET | Freq: Every day | ORAL | Status: DC
  Administered 2012-06-19: 750 mg via ORAL
  Filled 2012-06-19: qty 1

## 2012-06-19 NOTE — Discharge Summary (Signed)
Physician Discharge Summary  [name] NWG:[id number] DOB: 26-Dec-1955 DOA: 06/17/2012  PCP: Kirk Ruths, MD  Admit date: 06/17/2012 Discharge date: 06/19/2012  Recommendations for Outpatient Follow-up:  1. Followup resolution of pneumonia 2. Close followup PT/INR while on antibiotics  Lab Results  Component Value Date   INR 3.37* 06/19/2012   INR 2.50* 06/18/2012   INR 2.28* 06/17/2012    Follow-up Information   Follow up with Kirk Ruths, MD.   Contact information:   [address] [place] [address] [phone]      Discharge Diagnoses:  1. Pneumonia with possible early sepsis 2. Severe persistent asthma with chronic sinusitis, stable 3. History of DVT  Discharge Condition: Improved Disposition: Home  Diet recommendation: Regular  Filed Weights   06/17/12 1700  Weight: 82.963 kg (182 lb 14.4 oz)    History of present illness:  57 year old woman presented emergency department with increasing shortness of breath and fever. Initial evaluation suggested pneumonia, possible early sepsis and given complex medical history patient was referred for admission.  Hospital Course:  [name] was started on empiric antibiotic therapy and rapidly improved. She was quickly weaned off oxygen and transition to oral antibiotics. Sputum culture could not be obtained and given lack of fever and admission to the medical floor now blood cultures were obtained. She is now stable for discharge home. INR slightly elevated while on antibiotics. She will followup in the office tomorrow for adjustment of her Coumadin.  1. Pneumonia with possible early sepsis: Appears clinically resolved at this point. Given her rapid improvement would narrow antibiotics to monotherapy. No evidence to suggest MRSA. She has a history of Pseudomonas bacteremia in the past in the context of chemotherapy for non-Hodgkin's lymphoma. However there is no evidence to suggest  recurrence at this point. Given her rapid clinical improvement and stability discharge today is reasonable. 2. Severe persistent asthma with chronic sinusitis: Appears stable. Continue Dulera, prednisone, Accolate. Nebulizers as needed. Continue nighttime oxygen at 2 L. 3. History of DVT: INR slightly high today. Hold warfarin today. Close outpatient followup. 4. History of non-Hodgkin's lymphoma   Consultants:  None Procedures:  None Antibiotics:  Cefepime 6/16 >> 6/17  IV vancomycin 6/16 >> 6/17  Levaquin 6/17 >> 6/23  Discharge Instructions  Discharge Orders   Future Appointments Provider Department Dept Phone   07/11/2012 9:45 AM Storm Frisk, MD [facility] [phone]   07/11/2012 11:15 AM Barbaraann Share, MD [facility] [phone]   07/17/2012 11:30 AM Ellouise Newer, PA-C [facility] [phone]   Future Orders Complete By Expires     Activity as tolerated - No restrictions  As directed     Diet general  As directed     Discharge instructions  As directed     Comments:      Continue Levaquin to treat pneumonia. Do not take Coumadin today 6/17. Go to Dr. Edison Simon office 6/18 in the morning for PT/INR check, you do not need an appointment. The office will then advise you on when to restart Coumadin and at what dose. Call your physician or seek immediate medical attention for increased shortness of breath, fever, bleeding or worsening of your condition.        Medication List    STOP taking these medications       amoxicillin-clavulanate 875-125 MG per tablet  Commonly known as:  AUGMENTIN     warfarin 10 MG tablet  Commonly known  as:  COUMADIN      TAKE these medications       ACCU-CHEK AVIVA PLUS test strip  Generic drug:  glucose blood  1 each by Other route 2 (two) times a week.     accu-chek multiclix lancets  1 each by Other route 2 (two) times a week.     acetaminophen 500 MG tablet  Commonly known as:  TYLENOL   Take 500 mg by mouth every 6 (six) hours as needed for pain or fever.     acyclovir 400 MG tablet  Commonly known as:  ZOVIRAX  Take 1 tablet twice daily     albuterol (2.5 MG/3ML) 0.083% nebulizer solution  Commonly known as:  PROVENTIL  Take 2.5 mg by nebulization every 4 (four) hours as needed for wheezing or shortness of breath.     albuterol 108 (90 BASE) MCG/ACT inhaler  Commonly known as:  PROVENTIL HFA;VENTOLIN HFA  Inhale 2 puffs into the lungs every 6 (six) hours as needed for wheezing or shortness of breath.     citalopram 20 MG tablet  Commonly known as:  CELEXA  Take 20 mg by mouth every morning.     fexofenadine 180 MG tablet  Commonly known as:  ALLEGRA  Take 180 mg by mouth daily as needed.     fluticasone 50 MCG/ACT nasal spray  Commonly known as:  FLONASE  Place 2 sprays into the nose 2 (two) times daily.     furosemide 20 MG tablet  Commonly known as:  LASIX  Take 1 tablet by mouth every other day as needed. *Usually takes as needed for fluid retention upon weighing in**     levofloxacin 750 MG tablet  Commonly known as:  LEVAQUIN  Take 1 tablet (750 mg total) by mouth daily. Start 6/18 in the morning.     mometasone-formoterol 200-5 MCG/ACT Aero  Commonly known as:  DULERA  Inhale 2 puffs into the lungs 2 (two) times daily.     omeprazole 20 MG capsule  Commonly known as:  PRILOSEC  Take 1 capsule (20 mg total) by mouth 2 (two) times daily.     predniSONE 5 MG tablet  Commonly known as:  DELTASONE  HOLD while on prednisone 20mg ,then resume when 20mg  finishes     simvastatin 80 MG tablet  Commonly known as:  ZOCOR  Take 80 mg by mouth at bedtime.     TESSALON 200 MG capsule  Generic drug:  benzonatate  Take 200 mg by mouth 3 (three) times daily as needed for cough.     zafirlukast 20 MG tablet  Commonly known as:  ACCOLATE  Take 1 tablet (20 mg total) by mouth 2 (two) times daily.       Allergies  Allergen Reactions  . Meperidine Hcl  Anaphylaxis  . Montelukast Sodium Hives and Rash    The results of significant diagnostics from this hospitalization (including imaging, microbiology, ancillary and laboratory) are listed below for reference.    Significant Diagnostic Studies: Dg Chest 2 View  06/17/2012   *RADIOLOGY REPORT*  Clinical Data: Shortness of breath.  Asthma.  Non-Hodgkins lymphoma  CHEST - 2 VIEW  Comparison: 03/19/2012  Findings: Mild streaky opacity is seen in the left lower lobe which has increased since previous study, and suspicious for pneumonia. Right lung is clear.  No evidence of pleural effusion.  Heart size is normal.  IMPRESSION: Increase streaky opacity in the left lower lobe, suspicious for pneumonia.   Original Report  Authenticated By: Myles Rosenthal, M.D.   Labs: Basic Metabolic Panel:  Recent Labs Lab 06/17/12 1051 06/18/12 0513 06/19/12 0253  NA 137 132* 139  K 3.7 5.3* 3.1*  CL 97 67* 100  CO2 30 28 29   GLUCOSE 111* 184* 111*  BUN 14 19 18   CREATININE 1.03 0.93 0.94  CALCIUM 9.4 9.5 8.9   Liver Function Tests:  Recent Labs Lab 06/17/12 1051  AST 27  ALT 25  ALKPHOS 106  BILITOT 0.5  PROT 6.9  ALBUMIN 3.3*   CBC:  Recent Labs Lab 06/17/12 1051 06/18/12 0513  WBC 7.7 12.7*  NEUTROABS 6.4  --   HGB 12.4 11.4*  HCT 37.7 34.9*  MCV 93.1 92.8  PLT 151 179    Principal Problem:   Pneumonia Active Problems:   NON-HODGKIN'S LYMPHOMA   ALLERGIC RHINITIS   Severe persistent asthma with chronic sinusitis   Sepsis   Time coordinating discharge: 25 minutes  Signed:  Brendia Sacks, MD [facility] 06/19/2012, 9:27 AM

## 2012-06-19 NOTE — Progress Notes (Signed)
ANTIBIOTIC CONSULT NOTE   Pharmacy Consult for Vancomycin & Cefepime Indication: pneumonia  Allergies  Allergen Reactions  . Meperidine Hcl Anaphylaxis  . Montelukast Sodium Hives and Rash   Patient Measurements: Height: 5\' 2"  (157.5 cm) Weight: 182 lb 14.4 oz (82.963 kg) IBW/kg (Calculated) : 50.1  Vital Signs: Temp: 98 F (36.7 C) (06/17 0534) BP: 132/79 mmHg (06/17 0534) Pulse Rate: 70 (06/17 0534) Intake/Output from previous day:   Intake/Output from this shift:    Labs:  Recent Labs  06/17/12 1051 06/18/12 0513 06/19/12 0253  WBC 7.7 12.7*  --   HGB 12.4 11.4*  --   PLT 151 179  --   CREATININE 1.03 0.93 0.94   Estimated Creatinine Clearance: 66 ml/min (by C-G formula based on Cr of 0.94).  Recent Labs  06/19/12 0300  VANCOTROUGH 15.1    Microbiology: No results found for this or any previous visit (from the past 720 hour(s)).  Medical History: Past Medical History  Diagnosis Date  . Non Hodgkin's lymphoma   . Allergic rhinitis   . GERD (gastroesophageal reflux disease)   . Asthma   . Chronic sinusitis   . Respiratory failure   . Cavitary lung disease   . PONV (postoperative nausea and vomiting)   . Endotracheally intubated      Assessment: 57yo female started  6/15 on Cefepime and Vancomycin for suspected pna.  Pt is currently afebrile.  Trough level is on target and SCr is stable.  Cefepime 6/15 >> Vancomycin 6/15 >>  Goal of Therapy:  Vancomycin trough level 15-20 mcg/ml  Plan:  Continue Vancomycin 1 GM IV every 12 hours Cefepime 1 GM IV every 8 hours Vancomycin trough weekly while on Vancomycin Check SCr at least twice weekly while on Vancomycin Duration of therapy per MD (anticipate 8 days)  Valrie Hart A 06/19/2012,7:48 AM

## 2012-06-19 NOTE — Progress Notes (Signed)
Patient with orders to be discharge home. Discharge instructions and prescriptions given, patient verbalized understanding. Patient in stable condition upon discharge. Patient left with family in private vehicle.

## 2012-06-19 NOTE — Progress Notes (Signed)
[facility] PROGRESS NOTE  CARIDAD SILVEIRA ZOX:[id number] DOB: 05-16-1955 DOA: 06/17/2012 PCP: Kirk Ruths, MD  Assessment/Plan: 1. Pneumonia with possible early sepsis: Appears clinically resolved at this point. Given her rapid improvement would narrow antibiotics to monotherapy. No evidence to suggest MRSA. She has a history of Pseudomonas bacteremia in the past in the context of chemotherapy for non-Hodgkin's lymphoma. However there is no evidence to suggest recurrence at this point. Given her rapid clinical improvement and stability discharge today is reasonable. 2. Severe persistent asthma with chronic sinusitis: Appears stable. Continue Dulera, prednisone, Accolate. Nebulizers as needed. Continue nighttime oxygen at 2 L. 3. History of DVT: INR slightly high today. Hold warfarin today. Close outpatient followup. 4. History of non-Hodgkin's lymphoma   Replete potassium  Hold warfarin tonight. Discussed with Dr. Edison Simon office--patient to go to the office tomorrow 6/18 for PT/INR. She does not need an appointment  Change to oral antibiotics  Will notify Dr. Delford Field of discharge  Home today with outpatient followup  Code Status: Full code DVT prophylaxis: Warfarin Family Communication: None present Disposition Plan: Asthma  Brendia Sacks, MD  [facility]  Pager [phone] If 7PM-7AM, please contact night-coverage at [email or web address], password [facility] 06/19/2012, 9:12 AM  LOS: 2 days   Clinical Summary: 57 year old woman presented emergency department with increasing shortness of breath and fever. Initial evaluation suggested pneumonia, possible early sepsis and given complex medical history patient was referred for admission.  Consultants:  None  Procedures:  None  Antibiotics:  Cefepime 6/16  >> 6/17  IV vancomycin 6/16 >> 6/17  Levaquin 6/17 >> 6/23  HPI/Subjective: Continues to feel much better. Minimal cough, wheeze. Breathing well. No dyspnea on  exertion. No nausea, vomiting or abdominal pain. Wants to go home.  Objective: Filed Vitals:   06/18/12 1912 06/18/12 2209 06/19/12 0534 06/19/12 0714  BP:  127/74 132/79   Pulse:  84 70   Temp:  98.7 F (37.1 C) 98 F (36.7 C)   TempSrc:      Resp:  20 22   Height:      Weight:      SpO2: 97% 91% 94% 90%   No intake or output data in the 24 hours ending 06/19/12 0912   Filed Weights   06/17/12 1700  Weight: 82.963 kg (182 lb 14.4 oz)    Exam:   General: Appears well today. Sitting in chair.  Cardiovascular regular rate and rhythm. No murmur, rub, gallop. No significant lower extremity edema.  Respiratory clear to auscultation bilaterally in general with few faint wheezes/crackles, no rhonchi, normal respiratory effort. No cough with deep breathing.  Psychiatric grossly normal mood and affect. Speech fluent and appropriate.  Data Reviewed:  Potassium 3.1. Basic metabolic panel otherwise unremarkable. INR 3.37. Strep pneumo antigen negative.  Pending studies:   Legionella antigen  Sputum culture could not be obtained  Scheduled Meds: . acyclovir  400 mg Oral BID  . albuterol  2.5 mg Nebulization Q4H WA  . atorvastatin  40 mg Oral q1800  . citalopram  20 mg Oral Daily  . fluticasone  2 spray Each Nare BID  . levofloxacin  750 mg Oral Daily  . loratadine  10 mg Oral Daily  . mometasone-formoterol  2 puff Inhalation BID  . pantoprazole  40 mg Oral BID  . potassium chloride  40 mEq Oral Q4H  . predniSONE  5 mg Oral Q breakfast  . Warfarin - Pharmacist Dosing Inpatient   Does not apply Q24H  . zafirlukast  20 mg Oral BID   Continuous Infusions:   Principal Problem:   Pneumonia Active Problems:   NON-HODGKIN'S LYMPHOMA   ALLERGIC RHINITIS   Severe persistent asthma with chronic sinusitis   Sepsis

## 2012-06-19 NOTE — Progress Notes (Signed)
ANTICOAGULATION CONSULT NOTE   Pharmacy Consult for Warfarin Indication: VTE treatment, h/o DVT  Allergies  Allergen Reactions  . Meperidine Hcl Anaphylaxis  . Montelukast Sodium Hives and Rash   Patient Measurements: Height: 5\' 2"  (157.5 cm) Weight: 182 lb 14.4 oz (82.963 kg) IBW/kg (Calculated) : 50.1  Vital Signs: Temp: 98 F (36.7 C) (06/17 0534) BP: 132/79 mmHg (06/17 0534) Pulse Rate: 70 (06/17 0534)  Labs:  Recent Labs  06/17/12 1051 06/17/12 1131 06/18/12 0513 06/19/12 0253  HGB 12.4  --  11.4*  --   HCT 37.7  --  34.9*  --   PLT 151  --  179  --   LABPROT  --  24.1* 25.8* 32.2*  INR  --  2.28* 2.50* 3.37*  CREATININE 1.03  --  0.93 0.94   Estimated Creatinine Clearance: 66 ml/min (by C-G formula based on Cr of 0.94).  Medical History: Past Medical History  Diagnosis Date  . Non Hodgkin's lymphoma   . Allergic rhinitis   . GERD (gastroesophageal reflux disease)   . Asthma   . Chronic sinusitis   . Respiratory failure   . Cavitary lung disease   . PONV (postoperative nausea and vomiting)   . Endotracheally intubated    Medications: Medications Prior to Admission  Medication Sig Dispense Refill  . ACCU-CHEK AVIVA PLUS test strip 1 each by Other route 2 (two) times a week.       [name] Kitchen acetaminophen (TYLENOL) 500 MG tablet Take 500 mg by mouth every 6 (six) hours as needed for pain or fever.      [name] Kitchen acyclovir (ZOVIRAX) 400 MG tablet Take 1 tablet twice daily  60 tablet  6  . albuterol (PROVENTIL HFA;VENTOLIN HFA) 108 (90 BASE) MCG/ACT inhaler Inhale 2 puffs into the lungs every 6 (six) hours as needed for wheezing or shortness of breath.      [name] Kitchen albuterol (PROVENTIL) (2.5 MG/3ML) 0.083% nebulizer solution Take 2.5 mg by nebulization every 4 (four) hours as needed for wheezing or shortness of breath.      [name] Kitchen amoxicillin-clavulanate (AUGMENTIN) 875-125 MG per tablet Take 1 tablet by mouth 2 (two) times daily.  20 tablet  0  . benzonatate (TESSALON) 200 MG  capsule Take 200 mg by mouth 3 (three) times daily as needed for cough.      . citalopram (CELEXA) 20 MG tablet Take 20 mg by mouth every morning.        . fexofenadine (ALLEGRA) 180 MG tablet Take 180 mg by mouth daily as needed.       . fluticasone (FLONASE) 50 MCG/ACT nasal spray Place 2 sprays into the nose 2 (two) times daily.  16 g  6  . furosemide (LASIX) 20 MG tablet Take 1 tablet by mouth every other day as needed. *Usually takes as needed for fluid retention upon weighing in**      . Lancets (ACCU-CHEK MULTICLIX) lancets 1 each by Other route 2 (two) times a week.       . mometasone-formoterol (DULERA) 200-5 MCG/ACT AERO Inhale 2 puffs into the lungs 2 (two) times daily.  1 Inhaler  5  . omeprazole (PRILOSEC) 20 MG capsule Take 1 capsule (20 mg total) by mouth 2 (two) times daily.  60 capsule  5  . predniSONE (DELTASONE) 5 MG tablet HOLD while on prednisone 20mg ,then resume when 20mg  finishes      . simvastatin (ZOCOR) 80 MG tablet Take 80 mg by mouth at bedtime.        [name] Kitchen  warfarin (COUMADIN) 10 MG tablet Take 5 mg by mouth daily.       . zafirlukast (ACCOLATE) 20 MG tablet Take 1 tablet (20 mg total) by mouth 2 (two) times daily.  60 tablet  5    Assessment: Continuation of Warfarin from home PTA Warfarin 5 mg po daily INR has trended up to supra-therapeutic range.  Goal of Therapy:  INR 2-3 Monitor platelets by anticoagulation protocol: Yes   Plan:  Hold Coumadin today INR/PT daily CBC, monitor platelets Labs per protocol  Valrie Hart A 06/19/2012,7:58 AM

## 2012-06-21 ENCOUNTER — Institutional Professional Consult (permissible substitution): Payer: Medicare Other | Admitting: Pulmonary Disease

## 2012-06-25 ENCOUNTER — Telehealth: Payer: Self-pay | Admitting: Adult Health

## 2012-06-25 MED ORDER — FLUCONAZOLE 150 MG PO TABS: ORAL_TABLET | ORAL | Status: DC

## 2012-06-25 NOTE — Telephone Encounter (Signed)
Complains of yeast after antibiotics will rx diflucan at her request

## 2012-06-26 IMAGING — PT NM PET TUM IMG RESTAG (PS) SKULL BASE T - THIGH
6 series · 25 of 25 positions shown · non-contrast
Comparison: PET CT scan 05/03/2010

CLINICAL DATA: Subsequent treatment strategy for lymphoma.

NUCLEAR MEDICINE PET SKULL BASE TO THIGH
Fasting Blood Glucose:  89
TECHNIQUE: 19.0 mCi F-18 FDG was injected intravenously via the
right side.  Full-ring PET imaging was performed from the skull
base through the mid-thighs 63  minutes after injection.  CT data
was obtained and used for attenuation correction and anatomic
localization only.  (This was not acquired as a diagnostic CT
examination.)

[Series 1: pet ac · axial · 3.3mm · 4.69mm/px · z∈[-911,-41]mm · 5 of 267 slices shown]
[im 1/267]
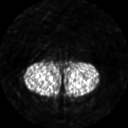
[im 67/267]
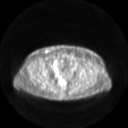
[im 134/267]
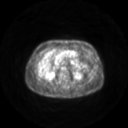
[im 200/267]
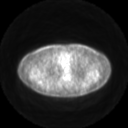
[im 267/267]
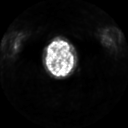

[Series 2: pet nac · axial · 3.3mm · 4.69mm/px · z∈[-911,-41]mm · 6 of 267 slices shown]
[im 1/267]
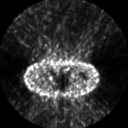
[im 54/267]
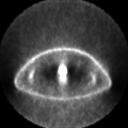
[im 107/267]
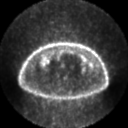
[im 160/267]
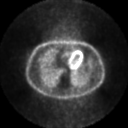
[im 213/267]
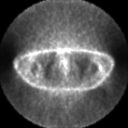
[im 267/267]
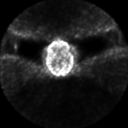

[Series 2: ct images · axial · 3.8mm · 0.98mm/px · z∈[-911,-41]mm · 6 of 267 slices shown]
[im 1/267]
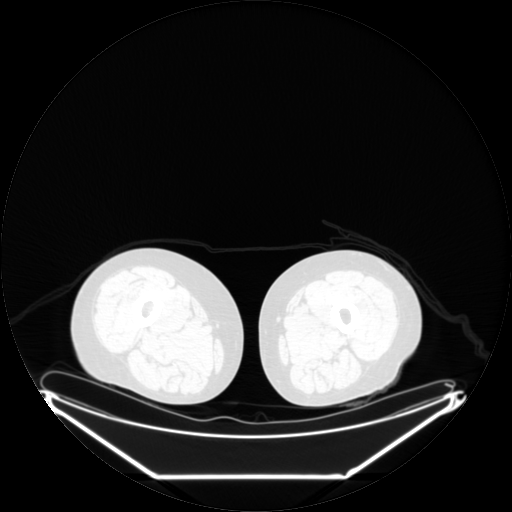
[im 54/267]
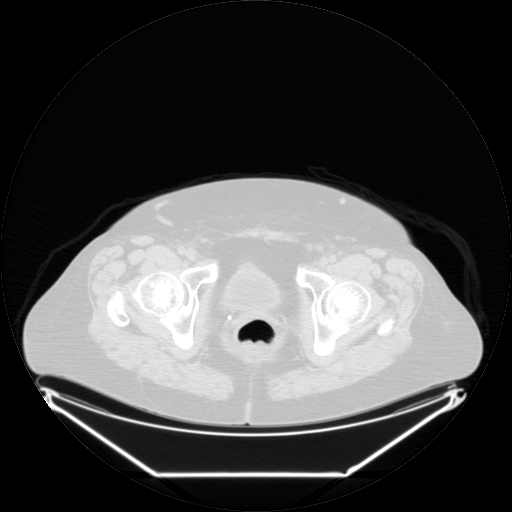
[im 107/267]
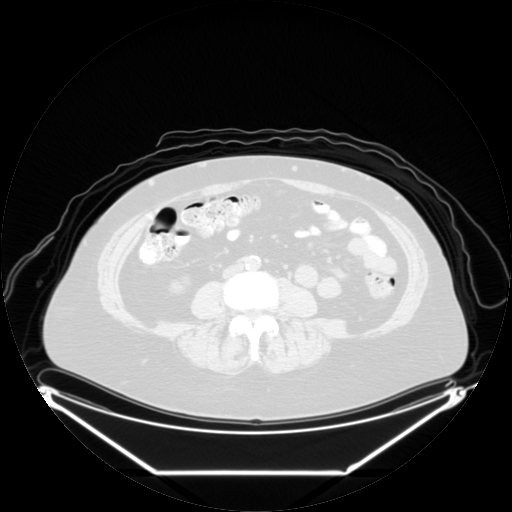
[im 160/267]
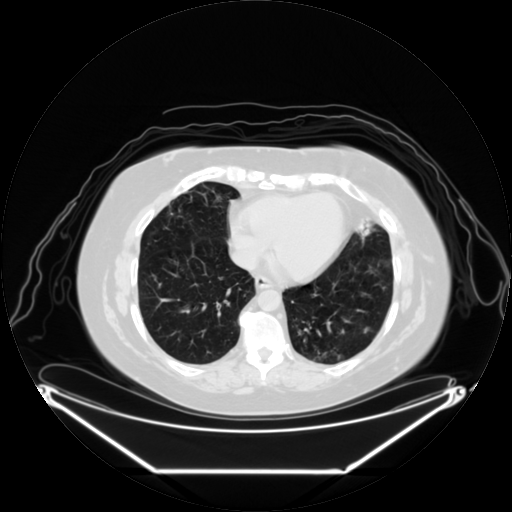
[im 213/267]
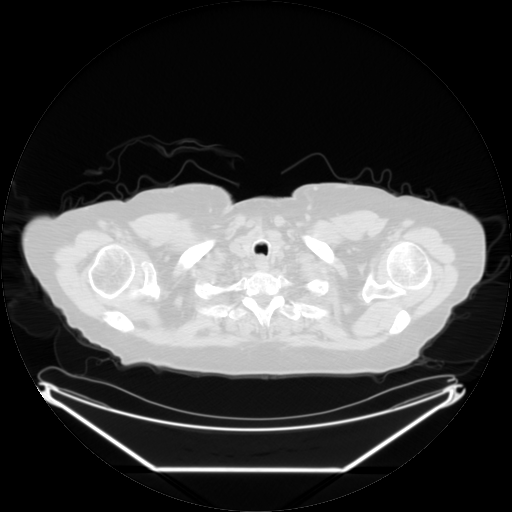
[im 267/267  brain]
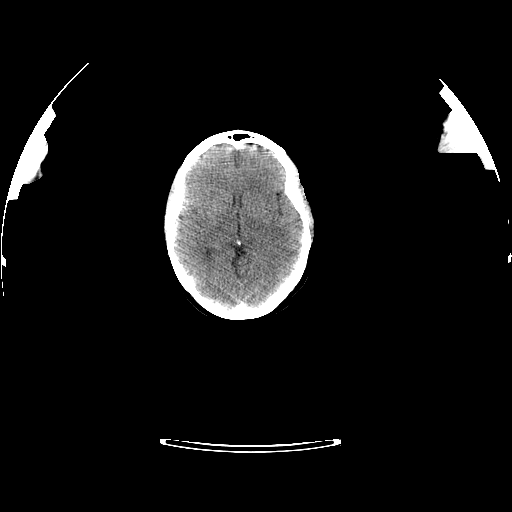

[Series 123: mip · coronal · 3.3mm · 4.69mm/px · 1 of 23 slices shown]
[im 1/23]
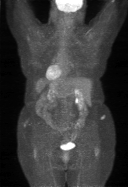

[Series 151: reformatted · axial · 3.3mm · 3.91mm/px · z∈[-911,-41]mm · 6 of 265 slices shown (1 of 2)]
[im 1/265]
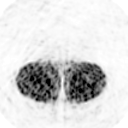
[im 53/265]
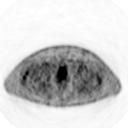
[im 106/265]
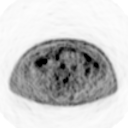
[im 159/265]
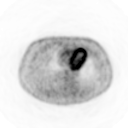
[im 212/265]
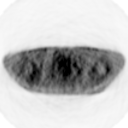
[im 265/265]
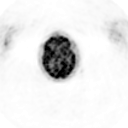

[Series 153: reformatted · coronal · 4.7mm · 6.98mm/px · 1 of 69 slices shown (2 of 2)]
[im 1/69]
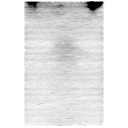

[25 of 25 positions shown; findings below may reference images not displayed]

FINDINGS: Neck:No hypermetabolic nodes in the neck.

Chest:No hypermetabolic mediastinal or hilar nodes.  No suspicious
pulmonary nodules.  Multiple venous collaterals are noted on the
anterior chest wall.

Stable 6 mm nodule in the right middle lobe which is unchanged from
CT of 01/23/2010.   [DATE] nodule in the inferior left lower lobe
is unchanged from PET CT of 05/03/2010.

Abdomen / Pelvis:There is a hypodense lesion in the right hepatic
lobe (image 40) which does not have associated metabolic activity.
The spleen is normal.  No abdominal or pelvic metabolic lymph nodes
or lymphadenopathy.

Skeleton:No focal hypermetabolic activity to suggest skeletal
metastasis.
IMPRESSION: No evidence of lymphoma recurrence.

Stable pulmonary nodules.

## 2012-07-11 ENCOUNTER — Other Ambulatory Visit: Payer: Medicare Other

## 2012-07-11 ENCOUNTER — Encounter: Payer: Self-pay | Admitting: Critical Care Medicine

## 2012-07-11 ENCOUNTER — Ambulatory Visit (INDEPENDENT_AMBULATORY_CARE_PROVIDER_SITE_OTHER): Payer: Medicare Other | Admitting: Critical Care Medicine

## 2012-07-11 ENCOUNTER — Ambulatory Visit (INDEPENDENT_AMBULATORY_CARE_PROVIDER_SITE_OTHER)
Admission: RE | Admit: 2012-07-11 | Discharge: 2012-07-11 | Disposition: A | Discharge status: Home / Self Care | Payer: Medicare Other | Source: Ambulatory Visit | Attending: Critical Care Medicine | Admitting: Critical Care Medicine

## 2012-07-11 ENCOUNTER — Institutional Professional Consult (permissible substitution): Payer: Medicare Other | Admitting: Pulmonary Disease

## 2012-07-11 VITALS — BP 102/72 | HR 68 | Temp 97.7°F | Ht 62.0 in | Wt 175.4 lb

## 2012-07-11 DIAGNOSIS — J45909 Unspecified asthma, uncomplicated: Secondary | ICD-10-CM

## 2012-07-11 DIAGNOSIS — D801 Nonfamilial hypogammaglobulinemia: Secondary | ICD-10-CM

## 2012-07-11 DIAGNOSIS — J019 Acute sinusitis, unspecified: Secondary | ICD-10-CM

## 2012-07-11 DIAGNOSIS — J189 Pneumonia, unspecified organism: Secondary | ICD-10-CM

## 2012-07-11 MED ORDER — CIPROFLOXACIN HCL 750 MG PO TABS: 750.0000 mg | ORAL_TABLET | Freq: Two times a day (BID) | ORAL | Status: DC

## 2012-07-11 MED ORDER — PREDNISONE 5 MG PO TABS: ORAL_TABLET | ORAL | Status: DC

## 2012-07-11 NOTE — Progress Notes (Addendum)
Subjective:    Patient ID: [name], female    DOB: Dec 22, 1955, 57 y.o.   MRN: [id number]  HPI 57 y.o. .WF former smoker  Asthma , hypogammaglobulinemia, chronic sinusits d/t pseudomonas Hx of Diffuse large B-Cell Lymphoma, S/P R-CHOP x 4 cycles with intrathecal chemotherapy during cycles 3 and 4 with methotrexate and Solu-Cortef.  Treatment was stopped due to Pseudomonas sepsis after cycles 2 and 4 which was very severe.     Rituxan alone  Every month, last dose 09/30/11. Now on Hold. PET due in 01/2012   07/11/2012 Chief Complaint  Patient presents with  . HFU    Was in [facility] from 6/15-6/17 for pna.  Oxygen levels are better since discharge.  Cough - prod in am with yellowish mucus.  A little wheezing.   Runny nose with clear to yellowish drainage - started yesterday.  Sinus pressure this am.   No chest tightness or chest pain.  Pt admitted with PNA. 6/15-6/17. LLL> no cults obtained  On review CXR to me looks like mucus plugging and No PNA  Sent out on 10days of Levaquin.  No off ABX  Since d/c was ok but then nasal drainage 24hrs and teeth sore ,  Notes cheekbone pain.  Notes sl h/a.  Sl cough yellow. Not green .  No fever.  No chest pressure or tightness   PUL ASTHMA HISTORY 07/11/2012 05/25/2012 01/25/2012 12/14/2011 10/11/2011  Symptoms Daily Daily Daily Daily >2 days/week  Nighttime awakenings 0-2/month >1/wk but not nightly Often--7/wk 3-4/month 0-2/month  Interference with activity Some limitations Some limitations Some limitations Some limitations Minor limitations  SABA use > 2 days/wk--not > 1 x/day Daily Several times/day Daily > 2 days/wk--not > 1 x/day  Exacerbations requiring oral steroids 2 or more / year 2 or more / year 2 or more / year 2 or more / year 0-1 / year    Review of Systems  Constitutional:   No  weight loss, night sweats,  Fevers, chills, + fatigue, lassitude. HEENT:   No headaches,  Difficulty swallowing,  Tooth/dental problems,  Sore throat,                 + sneezing, itching, ear ache CV:  No chest pain,  Orthopnea, PND, swelling in lower extremities, anasarca, dizziness, palpitations  GI  No heartburn, indigestion, abdominal pain, nausea, vomiting, diarrhea, change in bowel habits, loss of appetite  Resp:   No chest wall deformity  Skin: no rash or lesions.  GU: no dysuria, change in color of urine, no urgency or frequency.  No flank pain.  MS:  No joint pain or swelling.  No decreased range of motion.  No back pain.  Psych:  No change in mood or affect. No depression or anxiety.  No memory loss.     Objective:   Physical Exam  Filed Vitals:   07/11/12 0947  BP: 102/72  Pulse: 68  Temp: 97.7 F (36.5 C)  TempSrc: Oral  Height: 5\' 2"  (1.575 m)  Weight: 175 lb 6.4 oz (79.561 kg)  SpO2: 93%    Gen: Pleasant, well-nourished, in no distress,  normal affect  ENT: No lesions,  mouth clear,  oropharynx clear, no postnasal drip. Bilateral purulent nares  Neck: No JVD, no TMG, no carotid bruits  Lungs: No use of accessory muscles, no dullness to percussion, expir wheeze, scattered rhonchi.   Cardiovascular: RRR, heart sounds normal, no murmur or gallops, no peripheral edema  Abdomen: soft and NT, no  HSM,  BS normal  Musculoskeletal: No deformities, no cyanosis or clubbing  Neuro: alert, non focal  Skin: Warm, no lesions or rashes  Dg Chest 2 View  07/11/2012   *RADIOLOGY REPORT*  Clinical Data: Follow up pneumonia, cough, congestion  CHEST - 2 VIEW  Comparison: 06/17/2012  Findings: Cardiomediastinal silhouette is stable.  No acute infiltrate or pulmonary edema.  Improvement in aeration in left lower lobe with minimal residual streaky scarring.  Metallic fixation plate cervical spine again noted.  Bony thorax is stable.  IMPRESSION: No acute infiltrate or pulmonary edema.  Improvement in aeration in left lower lobe with minimal residual streaky scarring.   Original Report Authenticated By: Natasha Mead, M.D.           Assessment & Plan:   Severe persistent asthma with chronic sinusitis Severe persistent asthma with chronic sinusitis and flare No true pneumonia seen on CXR, just mucus plugging and ATX Plan Prednisone 5mg :  Take 8  for four days 6  for four days 4  for four days 2 for four days Then one daily  Cipro 750mg  twice daily for 2 weeks Nasal rinse daily A CT sinus will be obtained A chest xray wil lbe obtained All other meds same IgG level will be obtained, will likely need gamma globulin infusion performed Return 1 month   Acute sinusitis Acute on chronic sinusitis with flare Plan See asthma assessment  HYPOGAMMAGLOBULINEMIA History of hypogammaglobulinemia precipitating frequent infections Plan Recheck IgG.  May need replacement     Updated Medication List Outpatient Encounter Prescriptions as of 07/11/2012  Medication Sig Dispense Refill  . ACCU-CHEK AVIVA PLUS test strip 1 each by Other route 2 (two) times a week.       [name] Kitchen acetaminophen (TYLENOL) 500 MG tablet Take 500 mg by mouth every 6 (six) hours as needed for pain or fever.      [name] Kitchen acyclovir (ZOVIRAX) 400 MG tablet Take 1 tablet twice daily  60 tablet  6  . albuterol (PROVENTIL HFA;VENTOLIN HFA) 108 (90 BASE) MCG/ACT inhaler Inhale 2 puffs into the lungs every 6 (six) hours as needed for wheezing or shortness of breath.      [name] Kitchen albuterol (PROVENTIL) (2.5 MG/3ML) 0.083% nebulizer solution Take 2.5 mg by nebulization every 4 (four) hours as needed for wheezing or shortness of breath.      . benzonatate (TESSALON) 200 MG capsule Take 200 mg by mouth 3 (three) times daily as needed for cough.      . citalopram (CELEXA) 20 MG tablet Take 20 mg by mouth every morning.        . fexofenadine (ALLEGRA) 180 MG tablet Take 180 mg by mouth daily as needed.       . fluticasone (FLONASE) 50 MCG/ACT nasal spray Place 2 sprays into the nose 2 (two) times daily.  16 g  6  . furosemide (LASIX) 20 MG tablet Take 1 tablet by mouth every other  day as needed. *Usually takes as needed for fluid retention upon weighing in**      . Lancets (ACCU-CHEK MULTICLIX) lancets 1 each by Other route 2 (two) times a week.       . mometasone-formoterol (DULERA) 200-5 MCG/ACT AERO Inhale 2 puffs into the lungs 2 (two) times daily.  1 Inhaler  5  . omeprazole (PRILOSEC) 20 MG capsule Take 1 capsule (20 mg total) by mouth 2 (two) times daily.  60 capsule  5  . predniSONE (DELTASONE) 5 MG tablet Take 8  for four days 6  for four days 4  for four days 2 for four days Then one daily  100 tablet  0  . simvastatin (ZOCOR) 80 MG tablet Take 80 mg by mouth at bedtime.        . zafirlukast (ACCOLATE) 20 MG tablet Take 1 tablet (20 mg total) by mouth 2 (two) times daily.  60 tablet  5  . [DISCONTINUED] predniSONE (DELTASONE) 5 MG tablet HOLD while on prednisone 20mg ,then resume when 20mg  finishes      . [DISCONTINUED] predniSONE (DELTASONE) 5 MG tablet Take 5 mg by mouth daily.      . [DISCONTINUED] predniSONE (DELTASONE) 5 MG tablet Take 8  for four days 6  for four days 4  for four days 2 for four days Then one daily  60 tablet  0  . ciprofloxacin (CIPRO) 750 MG tablet Take 1 tablet (750 mg total) by mouth 2 (two) times daily.  28 tablet  0  . [DISCONTINUED] fluconazole (DIFLUCAN) 150 MG tablet Take 1 now and 1 in 3 days if not better  2 tablet  0  . [DISCONTINUED] levofloxacin (LEVAQUIN) 750 MG tablet Take 1 tablet (750 mg total) by mouth daily. Start 6/18 in the morning.  6 tablet  0   Facility-Administered Encounter Medications as of 07/11/2012  Medication Dose Route Frequency Provider Last Rate Last Dose  . 0.9 %  sodium chloride infusion   Intravenous Continuous Randall An, MD 20 mL/hr at 05/12/11 0914    . Immune Globulin 5% (OCTAGAM) SOLN 10 g  10 g Intravenous Once [employer], PA-C      . Immune Globulin 5% (OCTAGAM) SOLN 70 g  70 g Intravenous Once Ellouise Newer, PA-C

## 2012-07-11 NOTE — Assessment & Plan Note (Signed)
Severe persistent asthma with chronic sinusitis and flare No true pneumonia seen on CXR, just mucus plugging and ATX Plan Prednisone 5mg :  Take 8  for four days 6  for four days 4  for four days 2 for four days Then one daily  Cipro 750mg  twice daily for 2 weeks Nasal rinse daily A CT sinus will be obtained A chest xray wil lbe obtained All other meds same IgG level will be obtained, will likely need gamma globulin infusion performed Return 1 month

## 2012-07-11 NOTE — Assessment & Plan Note (Signed)
Acute on chronic sinusitis with flare Plan See asthma assessment

## 2012-07-11 NOTE — Progress Notes (Signed)
Quick Note:  Notify the patient that the Xray is stable and no pneumonia, the Left base changes seen in hospital has cleared No change in medications are recommended. Continue current meds as prescribed at last office visit ______

## 2012-07-11 NOTE — Assessment & Plan Note (Signed)
History of hypogammaglobulinemia precipitating frequent infections Plan Recheck IgG.  May need replacement

## 2012-07-11 NOTE — Patient Instructions (Addendum)
Prednisone 5mg :  Take 8  for four days 6  for four days 4  for four days 2 for four days Then one daily  Cipro 750mg  twice daily for 2 weeks Nasal rinse daily A CT sinus will be obtained A chest xray wil lbe obtained All other meds same IgG level will be obtained, will likely need gamma globulin infusion performed Return 1 month

## 2012-07-12 ENCOUNTER — Telehealth: Payer: Self-pay | Admitting: *Deleted

## 2012-07-12 NOTE — Telephone Encounter (Signed)
Result Note    Pt needs to go to  to get IV IgG infusion   I will work with you on this order   Tell pt her IgG level is low   ----  Per PW: pt needs to have Octagam IV 80 grams once.  Pt last had this in Nov 2013.  Was previously being ordered by Dr. Mariel Sleet.  Infusion needs to be done at [facility] Short Stay.  Will call Oncology tomorrow morning to see if they can help [place] order this correctly.

## 2012-07-13 ENCOUNTER — Ambulatory Visit (HOSPITAL_COMMUNITY)
Admission: RE | Admit: 2012-07-13 | Discharge: 2012-07-13 | Disposition: A | Discharge status: Home / Self Care | Payer: Medicare Other | Source: Ambulatory Visit | Attending: Critical Care Medicine | Admitting: Critical Care Medicine

## 2012-07-13 ENCOUNTER — Telehealth (HOSPITAL_COMMUNITY): Payer: Self-pay | Admitting: Oncology

## 2012-07-13 ENCOUNTER — Other Ambulatory Visit (HOSPITAL_COMMUNITY): Payer: Self-pay | Admitting: Oncology

## 2012-07-13 DIAGNOSIS — J019 Acute sinusitis, unspecified: Secondary | ICD-10-CM

## 2012-07-13 DIAGNOSIS — R51 Headache: Secondary | ICD-10-CM | POA: Insufficient documentation

## 2012-07-13 DIAGNOSIS — Z9889 Other specified postprocedural states: Secondary | ICD-10-CM | POA: Insufficient documentation

## 2012-07-13 DIAGNOSIS — J329 Chronic sinusitis, unspecified: Secondary | ICD-10-CM | POA: Insufficient documentation

## 2012-07-13 NOTE — Telephone Encounter (Signed)
I attempted to call [name] back at the # provided.   She didn't answer and no option to leave a msg.  Will try back in a little while.

## 2012-07-13 NOTE — Telephone Encounter (Signed)
[employer] [phone] PER DONNIE IVIG [id number] DOES NOT REQUIRED

## 2012-07-13 NOTE — Telephone Encounter (Signed)
Noted.  I will call Tammy back around 1:30 PM today.

## 2012-07-13 NOTE — Telephone Encounter (Signed)
Called [facility], Spoke with Angie.  Was advised the nurse is with a pt right now.  She will have a nurse call back to help walk through these orders.

## 2012-07-13 NOTE — Telephone Encounter (Signed)
Tammy returned Crystal's call.  She will not be available until 1:30 or 2:00 PM.  Antionette Fairy

## 2012-07-13 NOTE — Progress Notes (Signed)
Quick Note:  Spoke with pt. Informed her of results and recs. She is aware IVIG will be set up through [facility] per phone msg on 07/13/12. ______

## 2012-07-13 NOTE — Telephone Encounter (Signed)
I called [facility] and spoke with Tammy. Per Tammy, they usually do 2 days of IVIG for pt. Pt is scheduled to see their PA, Tom, on Tuesday of next week. Tammy states she has spoke with [name] who is willing to take over ordering the IVIG for pt and will dose this accordingly as well - pt may be able to receive first dose as early as Wednesday of next week. They can access pt's recent IgG level through epic. I spoke with Dr. Delford Field.  He is ok with [name], PA taking over on ordering and setting up the IVIG, and pls let them know pt does have an active sinus infection and a low IgG level.    I informed Tammy of this. She verbalized understanding.  They will take over from here with setting up IVIG and will inform pt when this is scheduled.   I have also called and spoke with Ms. Bringle.  She is aware of what is going on.  She voiced no further questions or concerns at this time.

## 2012-07-13 NOTE — Telephone Encounter (Signed)
Tammy returning call can be reached at [phone].[name]

## 2012-07-16 ENCOUNTER — Telehealth: Payer: Self-pay | Admitting: Critical Care Medicine

## 2012-07-16 NOTE — Telephone Encounter (Signed)
Pt is aware of CT results. She is going to contact Dr. Thurmon Fair office to be seen per PW's request. Nothing further was needed at this time.

## 2012-07-16 NOTE — Progress Notes (Signed)
Quick Note:  lmomtcb on pt's home and cell #s ______ 

## 2012-07-17 ENCOUNTER — Encounter (HOSPITAL_COMMUNITY): Payer: Medicare Other | Attending: Oncology | Admitting: Oncology

## 2012-07-17 VITALS — BP 121/74 | HR 93 | Temp 98.5°F | Resp 20 | Wt 173.2 lb

## 2012-07-17 DIAGNOSIS — C8589 Other specified types of non-Hodgkin lymphoma, extranodal and solid organ sites: Secondary | ICD-10-CM | POA: Insufficient documentation

## 2012-07-17 NOTE — Patient Instructions (Addendum)
[facility] Discharge Instructions  RECOMMENDATIONS MADE BY THE CONSULTANT AND ANY TEST RESULTS WILL BE SENT TO YOUR REFERRING PHYSICIAN.  Lab work prior to [name]. [name] has contacted your Pulmonologist to review xray of [name] lung lesion. We need to determine exactly what this lesion is. We will plan on seeing you back in 6 months, sooner if needed after [name] talks with Pulmonologist. Report any issues/concerns to clinic as needed prior to appointments.  Thank you for choosing [name] to provide your oncology and hematology care.  To afford each patient quality time with our providers, please arrive at least 15 minutes before your scheduled appointment time.  With your help, our goal is to use those 15 minutes to complete the necessary work-up to ensure our physicians have the information they need to help with your evaluation and healthcare recommendations.    Effective January 1st, 2014, we ask that you re-schedule your appointment with our physicians should you arrive 10 or more minutes late for your appointment.  We strive to give you quality time with our providers, and arriving late affects you and other patients whose appointments are after yours.    Again, thank you for choosing [name].  Our hope is that these requests will decrease the amount of time that you wait before being seen by our physicians.       _____________________________________________________________  Should you have questions after your visit to [name], please contact our office at [phone] between the hours of 8:30 a.m. and 5:00 p.m.  Voicemails left after 4:30 p.m. will not be returned until the following business day.  For prescription refill requests, have your pharmacy contact our office with your prescription refill request.

## 2012-07-17 NOTE — Progress Notes (Signed)
Kirk Ruths, MD [address] [place] [address]  NON-HODGKIN'S LYMPHOMA - Plan: warfarin (COUMADIN) 10 MG tablet, CBC with Differential, Comprehensive metabolic panel, Lactate dehydrogenase, Sedimentation rate, Beta 2 microglobuline, serum  CURRENT THERAPY: Observation  INTERVAL HISTORY: [name] 57 y.o. female returns for  regular  visit for followup of diffuse large B cell lymphoma, stage IV B. status post R. CHOP x4 cycles (08/14/2009- 10/23/2009) along with intrathecal chemotherapy during cycles 3 and 4 with methotrexate, 12 mg and SoluCortef 50 mg. Her chemotherapy had to be stopped due to severe Pseudomonas sepsis after cycles 2 and cycles 4. These episodes were life-threatening. Thus far she has no evidence for recurrent disease.  She has been following up with Dr. Delford Field. She was recently diagnosed with a sinusitis. He placed her on Cipro 750 mg twice daily for 2 weeks. She is now scheduled for IVIG starting tomorrow for day 1 and the following day for day 2.  The patient's chart is reviewed.  I personally reviewed and went over radiographic studies with the patient. PET scan performed on 01/17/2012 shows a 1.4 cm nodule within the right upper lobe exhibiting malignant range FDG uptake. This is worrisome for bronchogenic carcinoma. There was otherwise no indication of recurrence of disease. The patient's case was discussed with Dr. Delford Field by Dr. Mariel Sleet and the patient then underwent a CT Super D chest without contrast which showed a 1.4 center right upper lobe lesion appears less solid in more groundglass like. I know Dr. Mariel Sleet is been in touch with Dr. Delford Field regarding this patient but not privy to all the conversations between these 2 physicians. As a result, I have sent Dr. Delford Field a message through [facility] and I will await a return response. I suspect the patient would be a good candidate for a restaging PET scan in the future following the  resolution of her current infection to further evaluate this right upper lobe lesion. However, I will get Dr. Delford Field input.  She is tolerating her antibiotic well without any complications at this time.  From a hematologic standpoint, the patient denies any complaints and ROS questioning is negative.  She denies any B. symptoms.  She denies being a smoker in the past, but does admit to being around smokers and exposed to secondhand smoke prolonged period of time in the past.   Past Medical History  Diagnosis Date  . Non Hodgkin's lymphoma   . Allergic rhinitis   . GERD (gastroesophageal reflux disease)   . Asthma   . Chronic sinusitis   . Respiratory failure   . Cavitary lung disease   . PONV (postoperative nausea and vomiting)   . 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     is allergic to meperidine hcl and montelukast sodium.  [name] does not currently have medications on file.  Past Surgical History  Procedure Laterality Date  . Vesicovaginal fistula closure w/ tah    . Nasal sinus surgery    . Neck surgery      x 2   . Breast surgery    . Portacath placement  7/11    Removed 6/12  . Basal cell carcinoma excision  03/2011    scalp  . Port-a-cath removal    .  Peripherally inserted central catheter insertion    . Picc removal    . Tracheostomy    . Abdominal hysterectomy    . Lung lobectomy      right    Denies any headaches, dizziness, double vision, fevers, chills, night sweats, nausea, vomiting, diarrhea, constipation, chest pain, heart palpitations, shortness of breath, blood in stool, black tarry stool, urinary pain, urinary burning, urinary  frequency, hematuria.   PHYSICAL EXAMINATION  ECOG PERFORMANCE STATUS: 1 - Symptomatic but completely ambulatory  Filed Vitals:   07/17/12 1100  BP: 121/74  Pulse: 93  Temp: 98.5 F (36.9 C)  Resp: 20    GENERAL:alert, no distress, well nourished, well developed, comfortable, cooperative and smiling SKIN: skin color, texture, turgor are normal, no rashes or significant lesions, multiple ecchymoses on UE HEAD: Normocephalic, No masses, lesions, tenderness or abnormalities EYES: normal, PERRLA, EOMI, Conjunctiva are pink and non-injected EARS: External ears normal OROPHARYNX:mucous membranes are moist  NECK: supple, no adenopathy, thyroid normal size, non-tender, without nodularity, no stridor, non-tender, trachea midline LYMPH:  no palpable lymphadenopathy, no hepatosplenomegaly BREAST:not examined LUNGS: clear to auscultation and percussion HEART: regular rate & rhythm, no murmurs, no gallops, S1 normal and S2 normal ABDOMEN:abdomen soft, non-tender, obese, normal bowel sounds, no masses or organomegaly and no hepatosplenomegaly BACK: Back symmetric, no curvature., No CVA tenderness EXTREMITIES:less then 2 second capillary refill, no joint deformities, effusion, or inflammation, no edema, no skin discoloration, no clubbing, no cyanosis  NEURO: alert & oriented x 3 with fluent speech, no focal motor/sensory deficits, gait normal   LABORATORY DATA: CBC    Component Value Date/Time   WBC 12.7* 06/18/2012 0513   RBC 3.76* 06/18/2012 0513   HGB 11.4* 06/18/2012 0513   HCT 34.9* 06/18/2012 0513   PLT 179 06/18/2012 0513   MCV 92.8 06/18/2012 0513   MCH 30.3 06/18/2012 0513   MCHC 32.7 06/18/2012 0513   RDW 13.8 06/18/2012 0513   LYMPHSABS 0.5* 06/17/2012 1051   MONOABS 0.5 06/17/2012 1051   EOSABS 0.2 06/17/2012 1051   BASOSABS 0.0 06/17/2012 1051      Chemistry      Component Value Date/Time   NA 139 06/19/2012 0253   K 3.1* 06/19/2012 0253   CL 100 06/19/2012 0253   CO2 29  06/19/2012 0253   BUN 18 06/19/2012 0253   CREATININE 0.94 06/19/2012 0253      Component Value Date/Time   CALCIUM 8.9 06/19/2012 0253   ALKPHOS 106 06/17/2012 1051   AST 27 06/17/2012 1051   ALT 25 06/17/2012 1051   BILITOT 0.5 06/17/2012 1051        RADIOGRAPHIC STUDIES:  07/16/2012  *RADIOLOGY REPORT*  Clinical Data: Sinus pain and pressure with drainage. Two prior  sinus surgeries.  CT PARANASAL SINUS LIMITED WITHOUT CONTRAST  Technique: Multidetector CT images of the paranasal sinuses were  obtained in a single plane without contrast.  Comparison: 02/14/2012.  Findings: Right frontal sinuses clear. Mild mucosal thickening  inferior aspect left frontal sinus.  Prior maxillary antrectomy and ethmoidectomy bilaterally. Mucosal  thickening/partial opacification with air fluid level in the  maxillary sinuses greater on the left. Mucosal thickening along  the ethmoidectomy slight greater on the left.  Bilateral sphenoid sinus air cell mucosal thickening with bubbly  opacification on the right and air fluid level on the left.  Bony margins of the maxillary sinuses and sphenoid sinus air cells  are thickening consistent with changes of chronic sinusitis.  Visualized mastoid air cells and  middle ear cavities are clear.  Dental artifact limits evaluation.  Visualized intracranial and orbital structures unremarkable.  IMPRESSION:  Acute and chronic paranasal sinus type changes as discussed above.  Original Report Authenticated By: Lacy Duverney, M.D    ASSESSMENT:  1. Diffuse large B cell lymphoma, stage IV B. status post R. CHOP x4 cycles along with intrathecal chemotherapy during cycles 3 and 4 with methotrexate, 12 mg and SoluCortef 50 mg. Her chemotherapy had to be stopped due to severe Pseudomonas sepsis after cycles 2 and cycles 4. These episodes were life-threatening.  Thus far she has no evidence for recurrent disease 2. Chronic Pseudomonas sinusitis  3. Severe  hypogammaglobulinemia secondary to #1 requiring intermittent IVIG 4. SVC syndrome complicated by her Port-A-Cath placement and the port has been removed with resolution 5. Adult onset asthma  6. 1.4 cm right upper lobe of lung lesion with malignant range FDG uptake on PET scan in Jan 2014 with a subsequent CT Super D Chest wo contrast on 01/23/2012 describes the 1.4 cm lesion as ground-glass like in appearance and less solid.   Patient Active Problem List   Diagnosis Date Noted  . Daytime hypersomnolence 04/16/2012  . 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 HYPOGAMMAGLOBULINEMIA 12/30/2009  . NON-HODGKIN'S LYMPHOMA 09/09/2009  . HYPERLIPIDEMIA 04/21/2007  . HYPERTENSION 04/21/2007  . DIABETES MELLITUS, BORDERLINE 04/21/2007  . Severe persistent asthma with chronic sinusitis 04/03/2007  . Acute sinusitis 10/19/2006  . ALLERGIC RHINITIS 10/19/2006  . VOCAL CORD DISORDER 10/19/2006  . GERD 10/19/2006  . GASTRIC POLYP 07/25/2006     PLAN:  1. I personally reviewed and went over laboratory results with the patient. 2. I personally reviewed and went over radiographic studies with the patient. 3. IvIg scheduled and ordered.  Day 1 and 2.  4. Labs tomorrow with IVIg: CBC diff, CMET, LDH, ESR, B2M 5. Message sent to Dr. Delford Field regarding lung lesion.  Will wait for his opinions.  It may be reasonable to perform restaging PET scan in 1-2 months to follow-up on that right upper lobe lesion. 6. Return tomorrow and the following day for IVIg 7. Return in 6 months for follow-up   THERAPY PLAN:  Hematologically, the patient is doing well. A followup with Dr. Delford Field regarding future imaging studies with regards to the right upper lobe lung lesion. We'll tentatively see her back in 6 months for followup. We'll see her right after an imaging study if recommended by Dr. Delford Field.  All questions were answered. The patient knows to call the clinic with any problems, questions or  concerns. We can certainly see the patient much sooner if necessary.  Patient and plan discussed with Dr. Erline Hau and he is in agreement with the aforementioned.   KEFALAS,THOMAS

## 2012-07-18 ENCOUNTER — Encounter (HOSPITAL_BASED_OUTPATIENT_CLINIC_OR_DEPARTMENT_OTHER): Payer: Medicare Other

## 2012-07-18 VITALS — BP 141/65 | HR 61 | Temp 97.7°F | Resp 18

## 2012-07-18 DIAGNOSIS — J329 Chronic sinusitis, unspecified: Secondary | ICD-10-CM

## 2012-07-18 DIAGNOSIS — D839 Common variable immunodeficiency, unspecified: Secondary | ICD-10-CM

## 2012-07-18 DIAGNOSIS — C8589 Other specified types of non-Hodgkin lymphoma, extranodal and solid organ sites: Secondary | ICD-10-CM

## 2012-07-18 DIAGNOSIS — J019 Acute sinusitis, unspecified: Secondary | ICD-10-CM

## 2012-07-18 LAB — COMPREHENSIVE METABOLIC PANEL
ALT: 13 U/L (ref 0–35)
Albumin: 3.5 g/dL (ref 3.5–5.2)
Calcium: 9.3 mg/dL (ref 8.4–10.5)
GFR calc Af Amer: 60 mL/min — ABNORMAL LOW (ref >90)
Glucose, Bld: 132 mg/dL — ABNORMAL HIGH (ref 70–99)
Sodium: 140 mEq/L (ref 135–145)
Total Protein: 6.4 g/dL (ref 6.0–8.3)

## 2012-07-18 LAB — CBC WITH DIFFERENTIAL/PLATELET
Basophils Absolute: 0 10*3/uL (ref 0.0–0.1)
Basophils Relative: 0 % (ref 0–1)
Eosinophils Absolute: 0.2 10*3/uL (ref 0.0–0.7)
Eosinophils Relative: 3 % (ref 0–5)
Lymphs Abs: 0.7 10*3/uL (ref 0.7–4.0)
MCH: 30 pg (ref 26.0–34.0)
MCHC: 32.5 g/dL (ref 30.0–36.0)
MCV: 92.2 fL (ref 78.0–100.0)
Platelets: 172 10*3/uL (ref 150–400)
RDW: 14 % (ref 11.5–15.5)

## 2012-07-18 MED ORDER — SODIUM CHLORIDE 0.9 % IV SOLN: INTRAVENOUS | Status: DC

## 2012-07-18 MED ORDER — IMMUNE GLOBULIN (HUMAN) 10 GM/200ML IV SOLN
10.0000 g | Freq: Once | INTRAVENOUS | Status: AC
  Administered 2012-07-18: 10 g via INTRAVENOUS
  Filled 2012-07-18: qty 200

## 2012-07-18 MED ORDER — IMMUNE GLOBULIN (HUMAN) 10 GM/200ML IV SOLN: 1.0000 g/kg | Freq: Once | INTRAVENOUS | Status: DC

## 2012-07-18 MED ORDER — DEXTROSE 5 % IV SOLN
INTRAVENOUS | Status: DC
  Administered 2012-07-18: 50 mL via INTRAVENOUS

## 2012-07-18 MED ORDER — IMMUNE GLOBULIN (HUMAN) 10 GM/200ML IV SOLN
70.0000 g | Freq: Once | INTRAVENOUS | Status: AC
  Administered 2012-07-18: 70 g via INTRAVENOUS
  Filled 2012-07-18: qty 1400

## 2012-07-18 NOTE — Progress Notes (Signed)
[name] tolerated infusion well and without incident; titrated per policy.  Verbalizes understanding for follow-up.  No distress noted at time of discharge and patient was discharged home by herself.

## 2012-07-19 ENCOUNTER — Encounter (HOSPITAL_BASED_OUTPATIENT_CLINIC_OR_DEPARTMENT_OTHER): Payer: Medicare Other

## 2012-07-19 VITALS — BP 127/63 | HR 65 | Temp 97.7°F | Resp 16

## 2012-07-19 DIAGNOSIS — D839 Common variable immunodeficiency, unspecified: Secondary | ICD-10-CM

## 2012-07-19 DIAGNOSIS — C8589 Other specified types of non-Hodgkin lymphoma, extranodal and solid organ sites: Secondary | ICD-10-CM

## 2012-07-19 DIAGNOSIS — J019 Acute sinusitis, unspecified: Secondary | ICD-10-CM

## 2012-07-19 DIAGNOSIS — R911 Solitary pulmonary nodule: Secondary | ICD-10-CM

## 2012-07-19 DIAGNOSIS — J329 Chronic sinusitis, unspecified: Secondary | ICD-10-CM

## 2012-07-19 DIAGNOSIS — D801 Nonfamilial hypogammaglobulinemia: Secondary | ICD-10-CM

## 2012-07-19 LAB — BETA 2 MICROGLOBULIN, SERUM: Beta-2 Microglobulin: 1.59 mg/L (ref 1.01–1.73)

## 2012-07-19 MED ORDER — SODIUM CHLORIDE 0.9 % IJ SOLN
10.0000 mL | INTRAMUSCULAR | Status: DC | PRN
  Administered 2012-07-19: 10 mL via INTRAVENOUS
  Filled 2012-07-19: qty 10

## 2012-07-19 MED ORDER — IMMUNE GLOBULIN (HUMAN) 10 GM/200ML IV SOLN
70.0000 g | Freq: Once | INTRAVENOUS | Status: AC
  Administered 2012-07-19: 70 g via INTRAVENOUS
  Filled 2012-07-19: qty 1400

## 2012-07-19 MED ORDER — IMMUNE GLOBULIN (HUMAN) 10 GM/200ML IV SOLN
10.0000 g | Freq: Once | INTRAVENOUS | Status: AC
  Administered 2012-07-19: 10 g via INTRAVENOUS
  Filled 2012-07-19: qty 200

## 2012-07-19 MED ORDER — IMMUNE GLOBULIN (HUMAN) 10 GM/200ML IV SOLN
1.0000 g/kg | Freq: Once | INTRAVENOUS | Status: DC
Start: 2012-07-19 — End: 2012-07-19

## 2012-07-19 MED ORDER — DEXTROSE 5 % IV SOLN
INTRAVENOUS | Status: DC
  Administered 2012-07-19: 09:00:00 via INTRAVENOUS

## 2012-07-19 NOTE — Progress Notes (Signed)
[name] tolerated infusion well and without incident; verbalizes understanding for follow-up.  No distress noted at time of discharge and patient was discharged home by herself.

## 2012-07-22 IMAGING — CT CT PARANASAL SINUSES LIMITED
1 of 3 series · 8 of 30 positions shown, 10 images · non-contrast
Comparison: Sinus CT 06/08/2010.

CLINICAL DATA: Sinusitis.  Bilateral frontal and maxillary sinus
pain and pressure with drainage for 2 years.  History prior
surgery.

CT LIMITED SINUSES WITHOUT CONTRAST
TECHNIQUE: Multidetector CT images of the paranasal sinuses were
obtained in a single plane without contrast.

[Series 2: sinus ltd 1.8 u70u · axial · 0.31mm/px · z∈[+44,+134]mm · 8 of 24 slices shown, 10 images]
[im 3/24  brain]
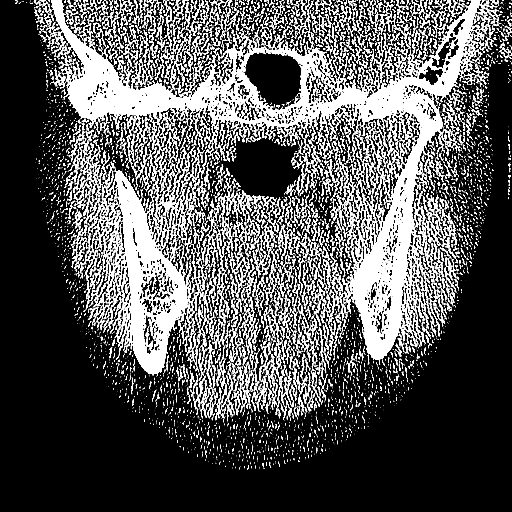
[im 3/24  bone]
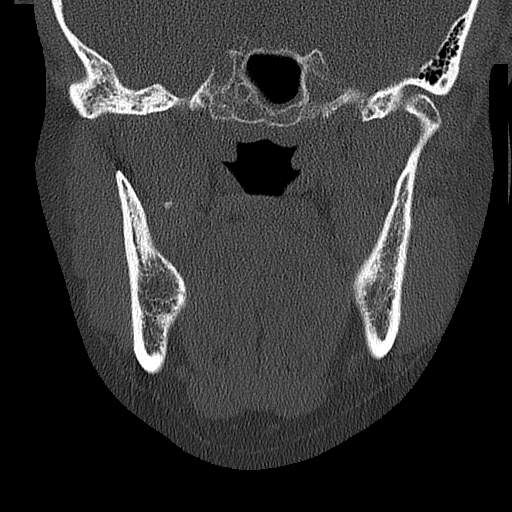
[im 6/24  bone]
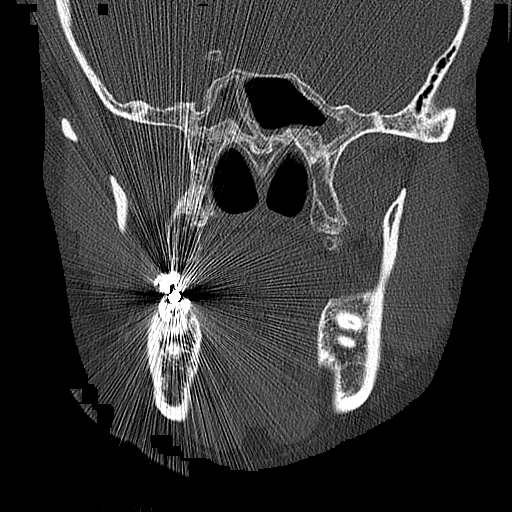
[im 8/24  bone]
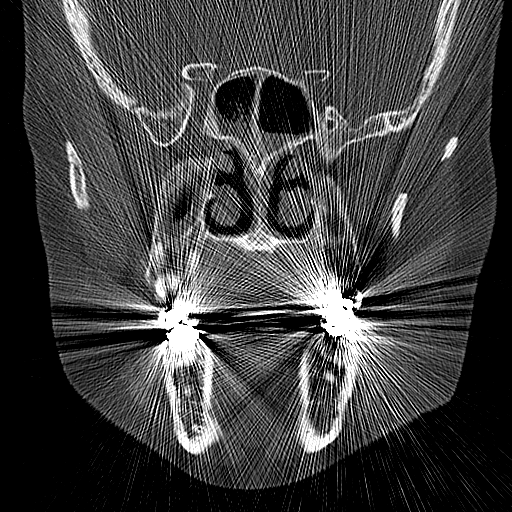
[im 11/24  bone]
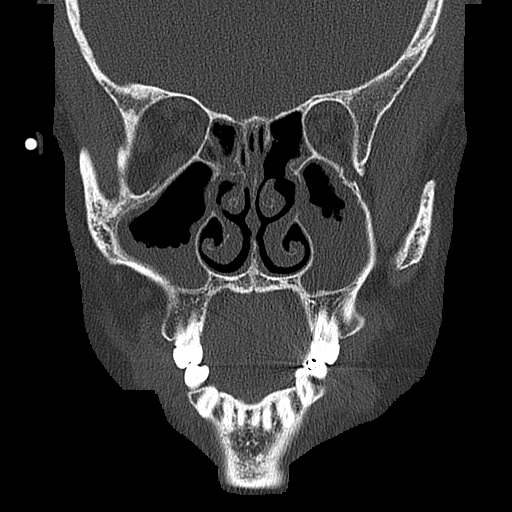
[im 13/24  brain]
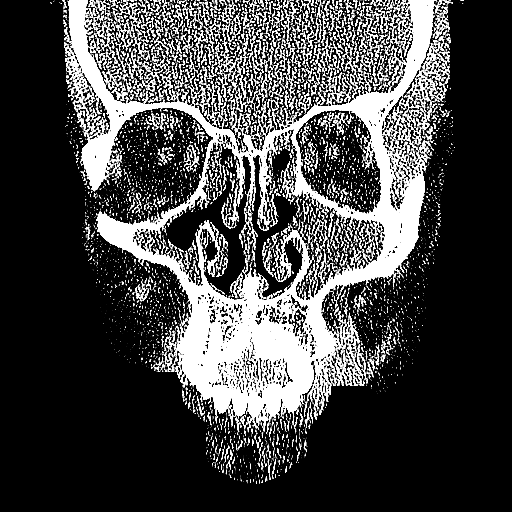
[im 13/24  bone]
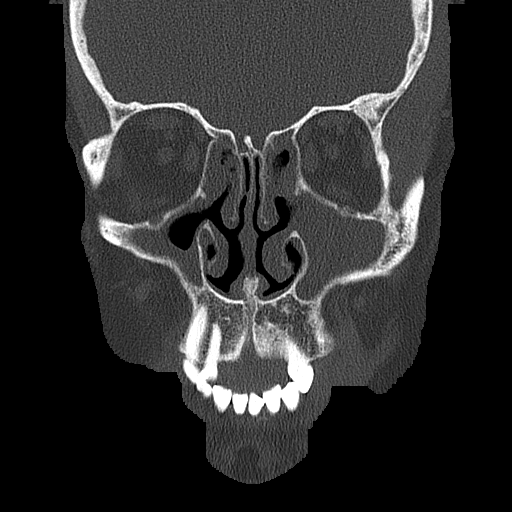
[im 16/24  bone]
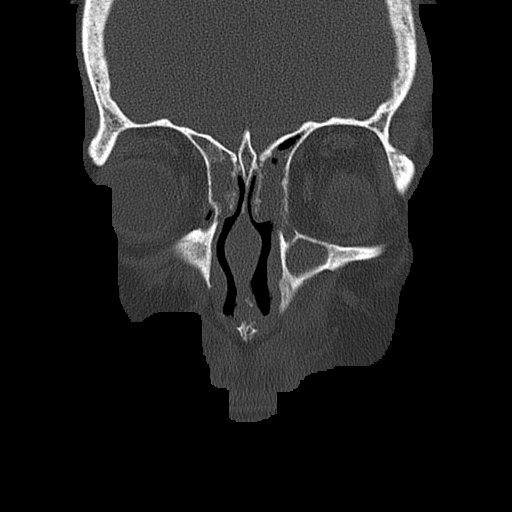
[im 18/24  bone]
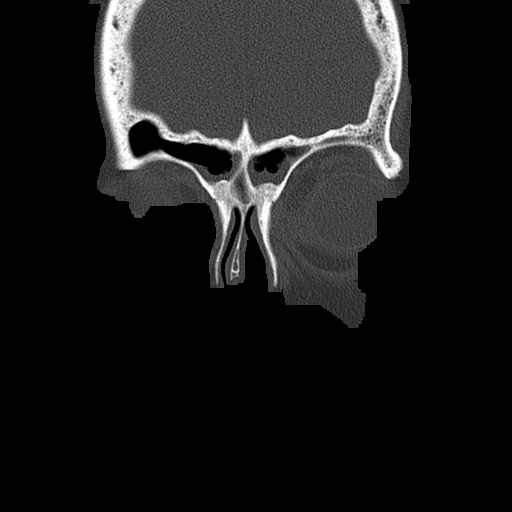
[im 21/24  bone]
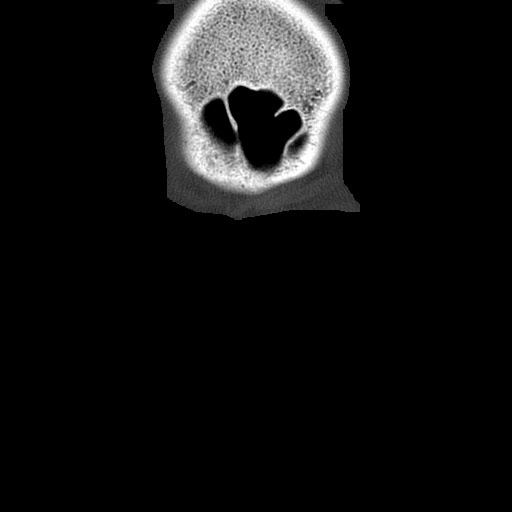

[8 of 30 positions shown; findings below may reference images not displayed]

FINDINGS: The patient is status post bilateral nasal antrostomies.
Now with air-fluid levels are now present within both maxillary
sinuses.  There are air-fluid levels within the sphenoid sinuses
bilaterally, worse on the right.  Partial ethmoidectomies have been
performed bilaterally with persistent mucosal thickening and
opacification.  Air-fluid levels are noted within the frontal
sinuses bilaterally.

Limited imaging of the brain is unremarkable.
IMPRESSION: 1.  Extensive pansinus disease with some progression since the
prior study.
2.  Multiple air-fluid levels suggesting acute disease.
3.  Wall thickening of the sinuses is compatible with chronic
disease as well.
4.  Previous bilateral ethmoidectomies and nasal antrostomies.

## 2012-07-30 ENCOUNTER — Ambulatory Visit (HOSPITAL_COMMUNITY): Payer: Medicare Other

## 2012-08-03 ENCOUNTER — Ambulatory Visit: Payer: Medicare Other | Admitting: Critical Care Medicine

## 2012-08-08 ENCOUNTER — Institutional Professional Consult (permissible substitution): Payer: Self-pay | Admitting: Pulmonary Disease

## 2012-08-15 ENCOUNTER — Telehealth: Payer: Self-pay | Admitting: Critical Care Medicine

## 2012-08-15 ENCOUNTER — Encounter: Payer: Self-pay | Admitting: Internal Medicine

## 2012-08-15 ENCOUNTER — Ambulatory Visit (INDEPENDENT_AMBULATORY_CARE_PROVIDER_SITE_OTHER): Payer: Medicare Other | Admitting: Internal Medicine

## 2012-08-15 VITALS — BP 106/70 | HR 81 | Temp 98.3°F | Ht 62.0 in | Wt 177.2 lb

## 2012-08-15 DIAGNOSIS — D801 Nonfamilial hypogammaglobulinemia: Secondary | ICD-10-CM

## 2012-08-15 DIAGNOSIS — R0902 Hypoxemia: Secondary | ICD-10-CM

## 2012-08-15 DIAGNOSIS — G4734 Idiopathic sleep related nonobstructive alveolar hypoventilation: Secondary | ICD-10-CM

## 2012-08-15 DIAGNOSIS — J45909 Unspecified asthma, uncomplicated: Secondary | ICD-10-CM

## 2012-08-15 MED ORDER — CIPROFLOXACIN HCL 750 MG PO TABS: 750.0000 mg | ORAL_TABLET | Freq: Two times a day (BID) | ORAL | Status: DC

## 2012-08-15 NOTE — Patient Instructions (Signed)
cipro 750 twice daily x 14 days but you'll need to have coumadin rechecked after 5 days  mucinex 1200 mg every 12 hours with the flutter valve  No change on the prednisone dose unless you start needing your nebulizer   GERD (REFLUX)  is an extremely common cause of respiratory symptoms, many times with no significant heartburn at all.    It can be treated with medication, but also with lifestyle changes including avoidance of late meals, excessive alcohol, smoking cessation, and avoid fatty foods, chocolate, peppermint, colas, red wine, and acidic juices such as orange juice.  NO MINT OR MENTHOL PRODUCTS SO NO COUGH DROPS  USE SUGARLESS CANDY INSTEAD (jolley ranchers or Stover's)  NO OIL BASED VITAMINS - use powdered substitutes.

## 2012-08-15 NOTE — Telephone Encounter (Signed)
I spoke with pt. She c/o cough w/ yellow phlem, fever ranging from 100.8-101.0 x Sunday. She is scheduled to come in to see MW at 10:30 today for eval. Nothing further needed

## 2012-08-15 NOTE — Progress Notes (Signed)
Subjective:    Patient ID: [name], female    DOB: 1955/02/15    MRN: [id number]  HPI 57 y.o. .WF former smoker  Asthma , hypogammaglobulinemia, chronic sinusits d/t pseudomonas Hx of Diffuse large B-Cell Lymphoma, S/P R-CHOP x 4 cycles with intrathecal chemotherapy during cycles 3 and 4 with methotrexate and Solu-Cortef.  Treatment was stopped due to Pseudomonas sepsis after cycles 2 and 4 which was very severe.     Rituxan alone  Every month, last dose 09/30/11. Now on Hold. PET due in 01/2012    07/11/2012 Chief Complaint  Patient presents with  . HFU    Was in [facility] from 6/15-6/17 for pna.  Oxygen levels are better since discharge.  Cough - prod in am with yellowish mucus.  A little wheezing.   Runny nose with clear to yellowish drainage - started yesterday.  Sinus pressure this am.   No chest tightness or chest pain.  Pt admitted with PNA. 6/15-6/17. LLL> no cults obtained  On review CXR to me looks like mucus plugging and No PNA  Sent out on 10days of Levaquin.  No off ABX  Since d/c was ok but then nasal drainage 24hrs and teeth sore ,  Notes cheekbone pain.  Notes sl h/a.  Sl cough yellow. Not green .  No fever.  No chest pressure or tightness   08/15/2012 Sahas Sluka on prednisone 5 mg baseline  Chief Complaint  Patient presents with  . Acute Visit    Pt c/o fever in the pm for the past 4 days. She is also cough for the past wk- prod with moderate to minimal dark yellow sputum.    last shot of immunoglobulin was 3 weeks prior to OV   at [facility] cipro has been best choice in past for flares but has to take it 2 weeks and is on coumadin Baseline needs no  l ventolin, now using it 2-3 x day, neb is still in reserve   No obvious daytime variabilty or assoc sob   or cp or chest tightness, subjective wheeze overt sinus or hb symptoms. No unusual exp hx or h/o childhood pna/ asthma or knowledge of premature birth.   Sleeping ok without nocturnal  or early am exacerbation  of respiratory   c/o's or need for noct saba. Also denies any obvious fluctuation of symptoms with weather or environmental changes or other aggravating or alleviating factors except as outlined above   Current Medications, Allergies, Past Medical History, Past Surgical History, Family History, and Social History were reviewed in [employer] record.  ROS  The following are not active complaints unless bolded sore throat, dysphagia, dental problems, itching, sneezing,  nasal congestion or excess/ purulent secretions, ear ache,   fever, chills, sweats, unintended wt loss, pleuritic or exertional cp, hemoptysis,  orthopnea pnd or leg swelling, presyncope, palpitations, heartburn, abdominal pain, anorexia, nausea, vomiting, diarrhea  or change in bowel or urinary habits, change in stools or urine, dysuria,hematuria,  rash, arthralgias, visual complaints, headache, numbness weakness or ataxia or problems with walking or coordination,  change in mood/affect or memory.              Objective:   Physical Exam  Wt Readings from Last 3 Encounters:  08/15/12 177 lb 3.2 oz (80.377 kg)  07/17/12 173 lb 3.2 oz (78.563 kg)  07/11/12 175 lb 6.4 oz (79.561 kg)      Gen: Pleasant, well-nourished, in no distress,  normal affect  ENT: No  lesions,  mouth clear,  oropharynx clear, no postnasal drip. Bilateral purulent nares  Neck: No JVD, no TMG, no carotid bruits  Lungs: No use of accessory muscles, no dullness to percussion, bilateral min insp/exp rhonchi   Cardiovascular: RRR, heart sounds normal, no murmur or gallops, no peripheral edema  Abdomen: soft and NT, no HSM,  BS normal  Musculoskeletal: No deformities, no cyanosis or clubbing  Neuro: alert, non focal  Skin: Warm, no lesions or rashes             Assessment & Plan:

## 2012-08-17 DIAGNOSIS — G4734 Idiopathic sleep related nonobstructive alveolar hypoventilation: Secondary | ICD-10-CM | POA: Insufficient documentation

## 2012-08-17 NOTE — Assessment & Plan Note (Addendum)
Will need to clarify who is responsible for tracking this problem and dosing adequate IgG as she says Dr Mariel Sleet is not longer there and she's doesn't have f/u at [facility] or know what doctor will be taking over the management of this problem

## 2012-08-17 NOTE — Assessment & Plan Note (Signed)
ONO 03/23/12 >postitive desats >begin O2 2 l/m 03/30/2012   Adequate control on present rx, reviewed > no change in rx needed

## 2012-08-17 NOTE — Assessment & Plan Note (Signed)
Acute flare with purulent secretions in setting of hypoglammaglobulinemia  rx with cipro emprically though note on coumadin so needs re check w/in 5 days  Need to sort out who is dosing IgG to keep levels adequate     Each maintenance medication was reviewed in detail including most importantly the difference between maintenance and as needed and under what circumstances the prns are to be used.  Please see instructions for details which were reviewed in writing and the patient given a copy.

## 2012-08-31 ENCOUNTER — Ambulatory Visit (INDEPENDENT_AMBULATORY_CARE_PROVIDER_SITE_OTHER): Payer: Medicare Other | Admitting: Critical Care Medicine

## 2012-08-31 ENCOUNTER — Encounter: Payer: Self-pay | Admitting: Critical Care Medicine

## 2012-08-31 VITALS — BP 104/70 | HR 75 | Temp 98.3°F | Ht 62.0 in | Wt 176.8 lb

## 2012-08-31 DIAGNOSIS — J45909 Unspecified asthma, uncomplicated: Secondary | ICD-10-CM

## 2012-08-31 DIAGNOSIS — Z23 Encounter for immunization: Secondary | ICD-10-CM

## 2012-08-31 NOTE — Patient Instructions (Addendum)
A flu vaccine will be obtained No change in medications Return 3 months

## 2012-08-31 NOTE — Progress Notes (Signed)
Subjective:    Patient ID: [name], female    DOB: 06-May-1955, 57 y.o.   MRN: [id number]  HPI 57 y.o. .WF former smoker  Asthma , hypogammaglobulinemia, chronic sinusits d/t pseudomonas Hx of Diffuse large B-Cell Lymphoma, S/P R-CHOP x 4 cycles with intrathecal chemotherapy during cycles 3 and 4 with methotrexate and Solu-Cortef.  Treatment was stopped due to Pseudomonas sepsis after cycles 2 and 4 which was very severe.     Rituxan alone  Every month, last dose 09/30/11. Now on Hold. PET due in 01/2012    07/11/2012 Chief Complaint  Patient presents with  . HFU    Was in [facility] from 6/15-6/17 for pna.  Oxygen levels are better since discharge.  Cough - prod in am with yellowish mucus.  A little wheezing.   Runny nose with clear to yellowish drainage - started yesterday.  Sinus pressure this am.   No chest tightness or chest pain.  Pt admitted with PNA. 6/15-6/17. LLL> no cults obtained  On review CXR to me looks like mucus plugging and No PNA  Sent out on 10days of Levaquin.  No off ABX  Since d/c was ok but then nasal drainage 24hrs and teeth sore ,  Notes cheekbone pain.  Notes sl h/a.  Sl cough yellow. Not green .  No fever.  No chest pressure or tightness   08/15/2012 Wert on prednisone 5 mg baseline  Chief Complaint  Patient presents with  . Acute Visit    Pt c/o fever in the pm for the past 4 days. She is also cough for the past wk- prod with moderate to minimal dark yellow sputum.    last shot of immunoglobulin was 3 weeks prior to OV   at [facility] cipro has been best choice in past for flares but has to take it 2 weeks and is on coumadin Baseline needs no  l ventolin, now using it 2-3 x day, neb is still in reserve   No obvious daytime variabilty or assoc sob   or cp or chest tightness, subjective wheeze overt sinus or hb symptoms. No unusual exp hx or h/o childhood pna/ asthma or knowledge of premature birth.   Sleeping ok without nocturnal  or early am exacerbation  of  respiratory  c/o's or need for noct saba. Also denies any obvious fluctuation of symptoms with weather or environmental changes or other aggravating or alleviating factors except as outlined above   08/31/2012 Chief Complaint  Patient presents with  . Follow-up    Breathing is unchanged.  Has DOE, some wheezing, and prod cough with yellow mucus.  No chest tightness, chest pain, or f/c/s.  Pt was seen by MW in July.   Dyspnea at baseline. Min wheeze. Notes some sinus pressure and sharp pain over R eye.  AM cough yellow mucus     Review of Systems Constitutional:   No  weight loss, night sweats,  Fevers, chills, fatigue, lassitude. HEENT:   No headaches,  Difficulty swallowing,  Tooth/dental problems,  Sore throat,                No sneezing, itching, ear ache, nasal congestion, post nasal drip,   CV:  No chest pain,  Orthopnea, PND, swelling in lower extremities, anasarca, dizziness, palpitations  GI  No heartburn, indigestion, abdominal pain, nausea, vomiting, diarrhea, change in bowel habits, loss of appetite  Resp: No shortness of breath with exertion or at rest.  No excess mucus, no productive cough,  No non-productive cough,  No coughing up of blood.  No change in color of mucus.  No wheezing.  No chest wall deformity  Skin: no rash or lesions.  GU: no dysuria, change in color of urine, no urgency or frequency.  No flank pain.  MS:  No joint pain or swelling.  No decreased range of motion.  No back pain.  Psych:  No change in mood or affect. No depression or anxiety.  No memory loss.     Objective:   Physical Exam Filed Vitals:   08/31/12 0915  BP: 104/70  Pulse: 75  Temp: 98.3 F (36.8 C)  TempSrc: Oral  Height: 5\' 2"  (1.575 m)  Weight: 176 lb 12.8 oz (80.196 kg)  SpO2: 94%    Gen: Pleasant, well-nourished, in no distress,  normal affect  ENT: No lesions,  mouth clear,  oropharynx clear, no postnasal drip  Neck: No JVD, no TMG, no carotid bruits  Lungs: No use  of accessory muscles, no dullness to percussion, clear without rales or rhonchi  Cardiovascular: RRR, heart sounds normal, no murmur or gallops, no peripheral edema  Abdomen: soft and NT, no HSM,  BS normal  Musculoskeletal: No deformities, no cyanosis or clubbing  Neuro: alert, non focal  Skin: Warm, no lesions or rashes  No results found.        Assessment & Plan:

## 2012-09-01 NOTE — Assessment & Plan Note (Signed)
Severe persistent asthma stable at present Plan Flu vaccine No change in inhaled or maintenance medications.

## 2012-09-05 ENCOUNTER — Encounter (INDEPENDENT_AMBULATORY_CARE_PROVIDER_SITE_OTHER): Payer: Self-pay | Admitting: *Deleted

## 2012-09-10 ENCOUNTER — Encounter: Payer: Self-pay | Admitting: Pulmonary Disease

## 2012-09-10 ENCOUNTER — Ambulatory Visit (INDEPENDENT_AMBULATORY_CARE_PROVIDER_SITE_OTHER): Payer: Medicare Other | Admitting: Pulmonary Disease

## 2012-09-10 VITALS — BP 126/84 | HR 84 | Temp 98.0°F | Ht 62.0 in | Wt 176.2 lb

## 2012-09-10 DIAGNOSIS — G4733 Obstructive sleep apnea (adult) (pediatric): Secondary | ICD-10-CM

## 2012-09-10 NOTE — Progress Notes (Signed)
Subjective:    Patient ID: [name], female    DOB: 30-Nov-1955, 57 y.o.   MRN: [id number]  HPI The patient is a 57 year old female who I've been asked to see for management of mild obstructive sleep apnea.  She was diagnosed in May of this year, with an AHI of 15 events per hour and transient oxygen desaturation as low as 75%.  Her pressure was optimized to 11 cm of water.  The patient has been noted to have loud snoring, but no one has mentioned witnessed apneas.  She denies choking arousals.  She did have a history of frequent awakenings at night for unknown reasons, but this has greatly improved.  She is on rested about 50% in the mornings, and does have sleep pressure intermittently during the day.  She denies any sleepiness issues in the evening watching television, but can get some sleep pressure driving longer distances.  The patient states that her weight is neutral but the last one to 2 years, and her Epworth score today is abnormal at 15.  Sleep Questionnaire What time do you typically go to bed?( Between what hours) 11 11 at 1046 on 09/10/12 by Barbaraann Share, MD How long does it take you to fall asleep?  at 1046 on 09/10/12 by Barbaraann Share, MD How many times during the night do you wake up? 5 5 at 1046 on 09/10/12 by Barbaraann Share, MD What time do you get out of bed to start your day? 0600 0600 at 1046 on 09/10/12 by Barbaraann Share, MD Do you drive or operate heavy machinery in your occupation? No No at 1046 on 09/10/12 by Barbaraann Share, MD How much has your weight changed (up or down) over the past two years? (In pounds) 0 oz (0 kg) 0 oz (0 kg) at 1046 on 09/10/12 by Barbaraann Share, MD Have you ever had a sleep study before? Yes Yes at 1046 on 09/10/12 by Barbaraann Share, MD If yes, location of study? If yes, date of study? Do you currently use CPAP? No No at 1046 on 09/10/12 by Barbaraann Share, MD Do you wear oxygen at any time? Yes Yes at 1046 on  09/10/12 by Barbaraann Share, MD O2 Flow Rate (L/min) 2 L/min 2 L/min at 1046 on 09/10/12 by Barbaraann Share, MD    Review of Systems  Constitutional: Negative for fever and unexpected weight change.  HENT: Negative for ear pain, nosebleeds, congestion, sore throat, rhinorrhea, sneezing, trouble swallowing, dental problem, postnasal drip and sinus pressure.   Eyes: Negative for redness and itching.  Respiratory: Positive for cough and shortness of breath. Negative for chest tightness and wheezing.   Cardiovascular: Negative for palpitations and leg swelling.  Gastrointestinal: Negative for nausea and vomiting.  Genitourinary: Negative for dysuria.  Musculoskeletal: Negative for joint swelling.  Skin: Positive for rash ( itching).  Neurological: Negative for headaches.  Hematological: Does not bruise/bleed easily.  Psychiatric/Behavioral: Negative for dysphoric mood. The patient is not nervous/anxious.        Objective:   Physical Exam Constitutional:  Obese female, no acute distress  HENT:  Nares patent without discharge  Oropharynx without exudate, palate and uvula are mildly elongated.   Eyes:  Perrla, eomi, no scleral icterus  Neck:  No JVD, no TMG  Cardiovascular:  Normal rate, regular rhythm, no rubs or gallops.  No murmurs        Intact distal pulses  Pulmonary :  Normal breath sounds, no stridor or respiratory distress   No rales, or wheezing.  Mild rhonchi.   Abdominal:  Soft, nondistended, bowel sounds present.  No tenderness noted.   Musculoskeletal:  No lower extremity edema noted.  Lymph Nodes:  No cervical lymphadenopathy noted  Skin:  No cyanosis noted  Neurologic:  Alert, appropriate, moves all 4 extremities without obvious deficit.         Assessment & Plan:

## 2012-09-10 NOTE — Assessment & Plan Note (Signed)
The patient has been diagnosed with mild obstructive sleep apnea, but does not feel that she has significant symptoms at this time.  At least it does not represent a significant cardiovascular risk given its mild nature.  I have reviewed the various options for treating mild sleep apnea, including a trial of weight loss alone, dental appliance, and CPAP.  At this point, she would like to take a period of time to work more aggressively on weight loss.  I am okay with this provided that she sets a time limit and goal, and is willing to try more consistent treatment if she is not able to reach this goal.  Will also discontinue her nocturnal oxygen since she had only a limited time less than 88%.

## 2012-09-10 NOTE — Patient Instructions (Addendum)
You have mild sleep apnea, and I have outlined a conservative treatment with a trial of weight loss alone vs dental appliance or cpap.  Let me know if you would like to consider more aggressive treatment. Will discontinue your oxygen since your levels dropped for only a short period of time.  However, if you gain weight or unable to make progress with your weight loss over the next 6 mos, may have to start back.

## 2012-09-12 ENCOUNTER — Other Ambulatory Visit (INDEPENDENT_AMBULATORY_CARE_PROVIDER_SITE_OTHER): Payer: Self-pay | Admitting: *Deleted

## 2012-09-12 ENCOUNTER — Telehealth (INDEPENDENT_AMBULATORY_CARE_PROVIDER_SITE_OTHER): Payer: Self-pay | Admitting: *Deleted

## 2012-09-12 DIAGNOSIS — Z1211 Encounter for screening for malignant neoplasm of colon: Secondary | ICD-10-CM

## 2012-09-12 NOTE — Telephone Encounter (Signed)
Patient needs movi prep 

## 2012-09-13 ENCOUNTER — Encounter (INDEPENDENT_AMBULATORY_CARE_PROVIDER_SITE_OTHER): Payer: Self-pay | Admitting: *Deleted

## 2012-09-13 MED ORDER — PEG-KCL-NACL-NASULF-NA ASC-C 100 G PO SOLR: 1.0000 | Freq: Once | ORAL | Status: DC

## 2012-10-18 ENCOUNTER — Telehealth (INDEPENDENT_AMBULATORY_CARE_PROVIDER_SITE_OTHER): Payer: Self-pay | Admitting: *Deleted

## 2012-10-18 NOTE — Telephone Encounter (Signed)
  Procedure: tcs  Reason/Indication:  screening  Has patient had this procedure before?  no  If so, when, by whom and where?    Is there a family history of colon cancer?  no  Who?  What age when diagnosed?    Is patient diabetic?   Borderline, diet control      Does patient have prosthetic heart valve?  no  Do you have a pacemaker?  no  Has patient ever had endocarditis? no  Has patient had joint replacement within last 12 months?  no  Does patient tend to be constipated or take laxatives? no  Is patient on Coumadin, Plavix and/or Aspirin? yes  Medications: coumadin 5 mg daily, acyclovir 400 mg bid, citalopram 20 mg daily, flonase spray 50 mcg 2 sprays bid, lasix 20 mg prn, dulera inhaler 200/5 mcg 2 puffs bid, omeprazole 20 mg daily, prednisone 5 mg daily, simvastatin 80 mg daily, accolate 20 mg daily  Allergies: singulair, demerol  Medication Adjustment: coumadin 5 days  Procedure date & time: 11/14/12 at 830

## 2012-10-18 NOTE — Telephone Encounter (Signed)
agree

## 2012-11-01 ENCOUNTER — Encounter (HOSPITAL_COMMUNITY): Payer: Self-pay | Admitting: Pharmacy Technician

## 2012-11-02 ENCOUNTER — Other Ambulatory Visit (HOSPITAL_COMMUNITY): Payer: Self-pay | Admitting: Oncology

## 2012-11-02 ENCOUNTER — Telehealth (HOSPITAL_COMMUNITY): Payer: Self-pay

## 2012-11-02 DIAGNOSIS — C8589 Other specified types of non-Hodgkin lymphoma, extranodal and solid organ sites: Secondary | ICD-10-CM

## 2012-11-02 DIAGNOSIS — B009 Herpesviral infection, unspecified: Secondary | ICD-10-CM

## 2012-11-02 MED ORDER — ACYCLOVIR 400 MG PO TABS: ORAL_TABLET | ORAL | Status: DC

## 2012-11-02 NOTE — Telephone Encounter (Signed)
Talked to [name] and notified her that prescription has been sent to her drugstore and that she needs to come by x-ray for chest xray on Monday.

## 2012-11-02 NOTE — Telephone Encounter (Signed)
Call from [facility] with complaints of discomfort in left lateral side and needing refill for acyclovir 400mg .  States  "Dr. Jerelyn Scott put me on the acyclovir to help prevent cold sores.  I've been out of it for about 2 weeks and wanted to see if refills can be called in, I'm already getting a cold sore in my mouth.  Also, when I was first diagnosed I was having pain in my left side and I've been hurting in the same area now for about 1 month. I don't have an appointment to be seen until January and don't have any scans ordered and I'm a little worried."

## 2012-11-02 NOTE — Telephone Encounter (Signed)
Acyclovir escribed.  We need to start with Chest Xray due to insurance.  Order placed for Monday for Chest xray.  Will pursue CT of chest following Chest Xray if necessary.

## 2012-11-05 ENCOUNTER — Other Ambulatory Visit (HOSPITAL_COMMUNITY): Payer: Self-pay | Admitting: Oncology

## 2012-11-05 ENCOUNTER — Ambulatory Visit (HOSPITAL_COMMUNITY)
Admission: RE | Admit: 2012-11-05 | Discharge: 2012-11-05 | Disposition: A | Discharge status: Home / Self Care | Payer: Medicare Other | Source: Ambulatory Visit | Attending: Oncology | Admitting: Oncology

## 2012-11-05 DIAGNOSIS — C8589 Other specified types of non-Hodgkin lymphoma, extranodal and solid organ sites: Secondary | ICD-10-CM

## 2012-11-05 DIAGNOSIS — R079 Chest pain, unspecified: Secondary | ICD-10-CM | POA: Insufficient documentation

## 2012-11-09 ENCOUNTER — Ambulatory Visit (HOSPITAL_COMMUNITY)
Admission: RE | Admit: 2012-11-09 | Discharge: 2012-11-09 | Disposition: A | Discharge status: Home / Self Care | Payer: Medicare Other | Source: Ambulatory Visit | Attending: Oncology | Admitting: Oncology

## 2012-11-09 ENCOUNTER — Encounter (HOSPITAL_COMMUNITY): Payer: Medicare Other | Attending: Hematology and Oncology

## 2012-11-09 DIAGNOSIS — R109 Unspecified abdominal pain: Secondary | ICD-10-CM | POA: Insufficient documentation

## 2012-11-09 DIAGNOSIS — C8589 Other specified types of non-Hodgkin lymphoma, extranodal and solid organ sites: Secondary | ICD-10-CM

## 2012-11-09 DIAGNOSIS — K7689 Other specified diseases of liver: Secondary | ICD-10-CM | POA: Insufficient documentation

## 2012-11-09 DIAGNOSIS — R079 Chest pain, unspecified: Secondary | ICD-10-CM | POA: Insufficient documentation

## 2012-11-09 DIAGNOSIS — C8590 Non-Hodgkin lymphoma, unspecified, unspecified site: Secondary | ICD-10-CM

## 2012-11-09 DIAGNOSIS — N269 Renal sclerosis, unspecified: Secondary | ICD-10-CM | POA: Insufficient documentation

## 2012-11-09 LAB — CBC WITH DIFFERENTIAL/PLATELET
Basophils Relative: 0 % (ref 0–1)
Eosinophils Absolute: 0.1 10*3/uL (ref 0.0–0.7)
Hemoglobin: 12.2 g/dL (ref 12.0–15.0)
MCHC: 32.3 g/dL (ref 30.0–36.0)
MCV: 91.3 fL (ref 78.0–100.0)
Monocytes Relative: 6 % (ref 3–12)
Neutro Abs: 10.6 10*3/uL — ABNORMAL HIGH (ref 1.7–7.7)
Neutrophils Relative %: 86 % — ABNORMAL HIGH (ref 43–77)
RBC: 4.14 MIL/uL (ref 3.87–5.11)

## 2012-11-09 LAB — COMPREHENSIVE METABOLIC PANEL
ALT: 19 U/L (ref 0–35)
AST: 22 U/L (ref 0–37)
Albumin: 3.8 g/dL (ref 3.5–5.2)
Alkaline Phosphatase: 101 U/L (ref 39–117)
Creatinine, Ser: 1.04 mg/dL (ref 0.50–1.10)
GFR calc Af Amer: 68 mL/min — ABNORMAL LOW (ref >90)
Glucose, Bld: 139 mg/dL — ABNORMAL HIGH (ref 70–99)
Potassium: 3.7 mEq/L (ref 3.5–5.1)
Sodium: 138 mEq/L (ref 135–145)
Total Protein: 7.2 g/dL (ref 6.0–8.3)

## 2012-11-09 MED ORDER — IOHEXOL 300 MG/ML  SOLN
100.0000 mL | Freq: Once | INTRAMUSCULAR | Status: AC | PRN
  Administered 2012-11-09: 100 mL via INTRAVENOUS

## 2012-11-09 NOTE — Progress Notes (Signed)
Labs drawn today for cbc/diff,ldh,cmp

## 2012-11-14 ENCOUNTER — Encounter (HOSPITAL_COMMUNITY)
Admission: RE | Disposition: A | Discharge status: Home / Self Care | Payer: Self-pay | Source: Ambulatory Visit | Attending: Internal Medicine

## 2012-11-14 ENCOUNTER — Ambulatory Visit (HOSPITAL_COMMUNITY)
Admission: RE | Admit: 2012-11-14 | Discharge: 2012-11-14 | Disposition: A | Discharge status: Home / Self Care | Payer: Medicare Other | Source: Ambulatory Visit | Attending: Internal Medicine | Admitting: Internal Medicine

## 2012-11-14 ENCOUNTER — Encounter (HOSPITAL_COMMUNITY): Payer: Self-pay

## 2012-11-14 DIAGNOSIS — Z1211 Encounter for screening for malignant neoplasm of colon: Secondary | ICD-10-CM

## 2012-11-14 DIAGNOSIS — D126 Benign neoplasm of colon, unspecified: Secondary | ICD-10-CM

## 2012-11-14 DIAGNOSIS — K620 Anal polyp: Secondary | ICD-10-CM | POA: Insufficient documentation

## 2012-11-14 DIAGNOSIS — D128 Benign neoplasm of rectum: Secondary | ICD-10-CM | POA: Insufficient documentation

## 2012-11-14 DIAGNOSIS — K644 Residual hemorrhoidal skin tags: Secondary | ICD-10-CM | POA: Insufficient documentation

## 2012-11-14 HISTORY — DX: Anxiety disorder, unspecified: F41.9

## 2012-11-14 HISTORY — DX: Acute myocardial infarction, unspecified: I21.9

## 2012-11-14 HISTORY — DX: Chronic kidney disease, unspecified: N18.9

## 2012-11-14 HISTORY — DX: Anemia, unspecified: D64.9

## 2012-11-14 HISTORY — PX: COLONOSCOPY: SHX5424

## 2012-11-14 SURGERY — COLONOSCOPY
Anesthesia: Moderate Sedation

## 2012-11-14 MED ORDER — SODIUM CHLORIDE 0.9 % IV SOLN
INTRAVENOUS | Status: DC
  Administered 2012-11-14: 08:00:00 via INTRAVENOUS

## 2012-11-14 MED ORDER — STERILE WATER FOR IRRIGATION IR SOLN
Status: DC | PRN
  Administered 2012-11-14: 09:00:00

## 2012-11-14 MED ORDER — MIDAZOLAM HCL 5 MG/5ML IJ SOLN
INTRAMUSCULAR | Status: AC
  Filled 2012-11-14: qty 10

## 2012-11-14 MED ORDER — MEPERIDINE HCL 50 MG/ML IJ SOLN
INTRAMUSCULAR | Status: AC
  Filled 2012-11-14: qty 1

## 2012-11-14 MED ORDER — FENTANYL CITRATE 0.05 MG/ML IJ SOLN
INTRAMUSCULAR | Status: DC | PRN
  Administered 2012-11-14 (×2): 25 ug via INTRAVENOUS

## 2012-11-14 MED ORDER — MIDAZOLAM HCL 5 MG/5ML IJ SOLN
INTRAMUSCULAR | Status: DC | PRN
  Administered 2012-11-14 (×3): 2 mg via INTRAVENOUS

## 2012-11-14 MED ORDER — FENTANYL CITRATE 0.05 MG/ML IJ SOLN
INTRAMUSCULAR | Status: AC
  Filled 2012-11-14: qty 2

## 2012-11-14 NOTE — Op Note (Signed)
COLONOSCOPY PROCEDURE REPORT  PATIENT:  [name]  MR#:  [id number] Birthdate:  10-26-1955, 57 y.o., female Endoscopist:  Dr. Malissa Hippo, MD Referred By:  Dr. Kirk Ruths, MD Procedure Date: 11/14/2012  Procedure:   Colonoscopy with snare polypectomy.  Indications:  Patient is 57 year old Caucasian female who is undergoing average risk screening colonoscopy.  Informed Consent:  The procedure and risks were reviewed with the patient and informed consent was obtained.  Medications:  Fentanyl 50 mcg IV Versed 6 mg IV  Description of procedure:  After a digital rectal exam was performed, that colonoscope was advanced from the anus through the rectum and colon to the area of the cecum, ileocecal valve and appendiceal orifice. The cecum was deeply intubated. These structures were well-seen and photographed for the record. From the level of the cecum and ileocecal valve, the scope was slowly and cautiously withdrawn. The mucosal surfaces were carefully surveyed utilizing scope tip to flexion to facilitate fold flattening as needed. The scope was pulled down into the rectum where a thorough exam including retroflexion was performed.  Findings:   Prep excellent. 8 mm polyp snared from cecum. 7 mm polyp snared from hepatic flexure and submitted with cecal polyp. 8mm polyp snared from transverse colon and submitted along with polyp from the splenic flexure which was cold snared. Focal area with edema and erythema at proximal transverse colon suspicious for healing ulcer. Biopsy taken. 6 mm rectal polyp hot snared. Small hemorrhoids below the dentate line.   Therapeutic/Diagnostic Maneuvers Performed:  See above  Complications: None  Cecal Withdrawal Time:  18 minutes  Impression:  Examination performed to cecum. Patient had five polyps altogether ranging in size from 6-8 mm. Cecal polyp and hepatic flexure polyp were submitted together. Polyp from transverse colon was  submitted along with polyp from splenic flexure. Rectal polyp was submitted separately. Focal colitis at transverse colon suspicious for healing ulcer. Biopsy taken for routine histology. Small external hemorrhoids.  Recommendations:  Standard instructions given. Patient to resume warfarin at usual dose starting this evening and get INR checked in 10 days or so. I will contact patient with biopsy results and further recommendations.  Dahlila Pfahler U  11/14/2012 9:29 AM  CC: Dr. Kirk Ruths, MD & Dr. Bonnetta Barry ref. provider found

## 2012-11-14 NOTE — H&P (Signed)
[name] is an 57 y.o. female.   Chief Complaint: Patient is here for colonoscopy. HPI: Patient is 57 year old Caucasian female is here for screening colonoscopy. This is patient's first exam. She denies abdominal pain change in her bowel habits or rectal bleeding. Family history is negative for CRC. Personal history significant for non-Hodgkin's lymphoma for which she was treated in 2011 remains in remission.  Past Medical History  Diagnosis Date  . Non Hodgkin's lymphoma   . Allergic rhinitis   . GERD (gastroesophageal reflux disease)   . Asthma   . Chronic sinusitis   . Respiratory failure   . Cavitary lung disease   . PONV (postoperative nausea and vomiting)   . Endotracheally intubated   . Myocardial infarction 2011  . Anxiety   . Chronic kidney disease   . Anemia     Past Surgical History  Procedure Laterality Date  . Vesicovaginal fistula closure w/ tah    . Nasal sinus surgery    . Neck surgery      x 2   . Portacath placement  7/11    Removed 6/12  . Basal cell carcinoma excision  03/2011    scalp  . Port-a-cath removal    . Peripherally inserted central catheter insertion    . Picc removal    . Tracheostomy    . Abdominal hysterectomy    . Lung lobectomy      right  . Breast surgery Right     Family History  Problem Relation Age of Onset  . Emphysema Mother   . Allergies Father   . Asthma Father     as a child  . Leukemia Maternal Grandmother   . Diabetes Brother   . Stroke Mother    Social History:  reports that she has never smoked. She has never used smokeless tobacco. She reports that she does not drink alcohol or use illicit drugs.  Allergies:  Allergies  Allergen Reactions  . Meperidine Hcl Anaphylaxis  . Montelukast Sodium Hives and Rash    Medications Prior to Admission  Medication Sig Dispense Refill  . acetaminophen (TYLENOL) 500 MG tablet Take 1,000 mg by mouth every 6 (six) hours as needed for pain or fever.       [name] Kitchen acyclovir  (ZOVIRAX) 400 MG tablet Take 1 tablet twice daily  60 tablet  6  . albuterol (PROVENTIL HFA;VENTOLIN HFA) 108 (90 BASE) MCG/ACT inhaler Inhale 2 puffs into the lungs every 6 (six) hours as needed for wheezing or shortness of breath.      [name] Kitchen albuterol (PROVENTIL) (2.5 MG/3ML) 0.083% nebulizer solution Take 2.5 mg by nebulization every 4 (four) hours as needed for wheezing or shortness of breath.      . benzonatate (TESSALON) 200 MG capsule Take 200 mg by mouth 3 (three) times daily as needed for cough.      . citalopram (CELEXA) 20 MG tablet Take 20 mg by mouth every morning.        . fexofenadine (ALLEGRA) 180 MG tablet Take 180 mg by mouth daily as needed.       . fluticasone (FLONASE) 50 MCG/ACT nasal spray Place 2 sprays into the nose 2 (two) times daily.  16 g  6  . furosemide (LASIX) 20 MG tablet Take 1 tablet by mouth every other day as needed. *Usually takes as needed for fluid retention upon weighing in**      . mometasone-formoterol (DULERA) 200-5 MCG/ACT AERO Inhale 2 puffs into the lungs  2 (two) times daily.  1 Inhaler  5  . omeprazole (PRILOSEC) 20 MG capsule Take 1 capsule (20 mg total) by mouth 2 (two) times daily.  60 capsule  5  . predniSONE (DELTASONE) 10 MG tablet Take 5 mg by mouth daily.      . simvastatin (ZOCOR) 80 MG tablet Take 80 mg by mouth at bedtime.        [name] Kitchen warfarin (COUMADIN) 10 MG tablet Take 5 mg by mouth daily. Take 1/2 tablet (5mg ) daily      . zafirlukast (ACCOLATE) 20 MG tablet Take 1 tablet (20 mg total) by mouth 2 (two) times daily.  60 tablet  5    No results found for this or any previous visit (from the past 48 hour(s)). No results found.  ROS  Blood pressure 135/85, pulse 89, temperature 98 F (36.7 C), temperature source Oral, resp. rate 24, height 5\' 2"  (1.575 m), weight 176 lb (79.833 kg), SpO2 94.00%. Physical Exam  Constitutional: She is oriented to person, place, and time. She appears well-developed and well-nourished.  HENT:  Mouth/Throat:  Oropharynx is clear and moist.  Eyes: Conjunctivae are normal. No scleral icterus.  Neck: No thyromegaly present.  Cardiovascular: Normal rate, regular rhythm and normal heart sounds.   No murmur heard. Respiratory: Effort normal.  Few expiratory rhonchi bilaterally  GI: Soft. She exhibits no distension and no mass. There is no tenderness.  Musculoskeletal: She exhibits no edema.  Lymphadenopathy:    She has no cervical adenopathy.  Neurological: She is alert and oriented to person, place, and time.  Skin: Skin is warm and dry.     Assessment/Plan Average risk screening colonoscopy.  [name] U 11/14/2012, 8:36 AM

## 2012-11-16 ENCOUNTER — Encounter (HOSPITAL_COMMUNITY): Payer: Self-pay | Admitting: Internal Medicine

## 2012-11-23 ENCOUNTER — Encounter (INDEPENDENT_AMBULATORY_CARE_PROVIDER_SITE_OTHER): Payer: Self-pay | Admitting: *Deleted

## 2012-12-18 IMAGING — CR DG CHEST 2V
2 series · 2 of 2 positions shown · non-contrast
Comparison: 09/03/2010

CLINICAL DATA: Cough.  History of lymphoma.  Evaluate for
consolidation.

CHEST - 2 VIEW

[view not recorded (1 of 2)]
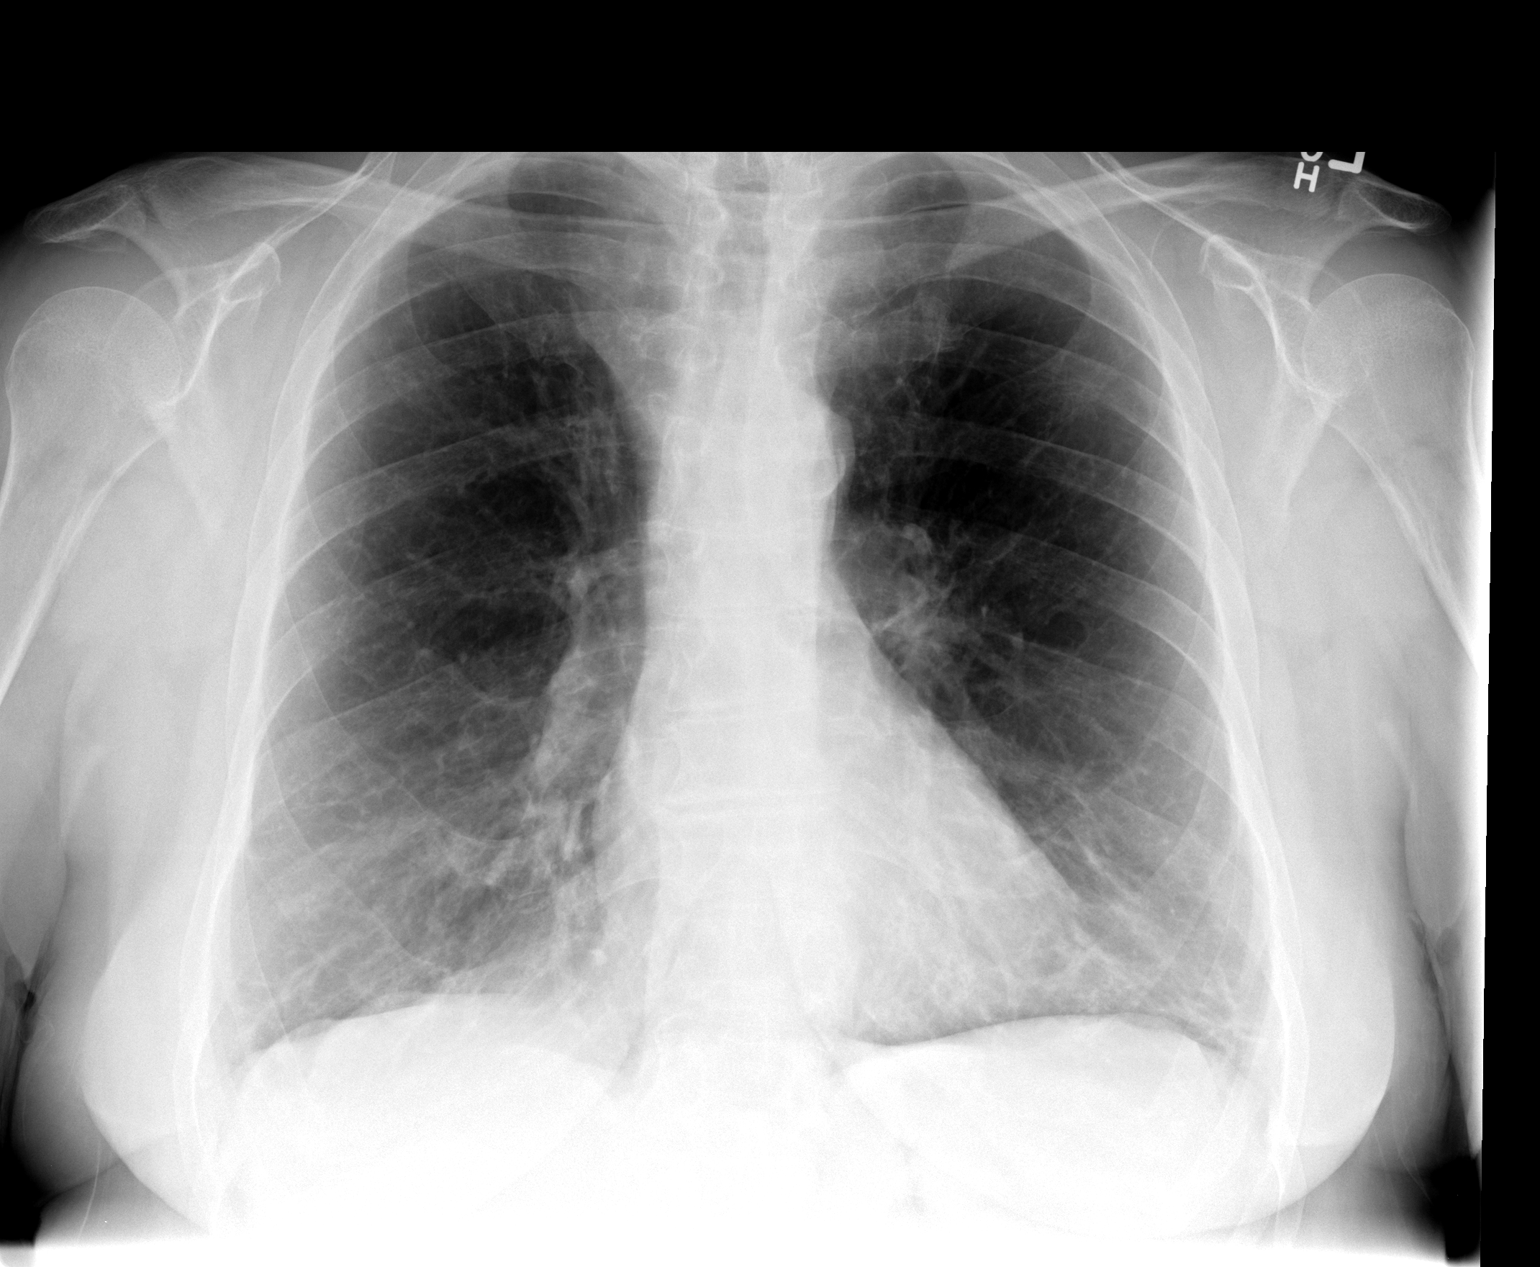

[view not recorded (2 of 2)]
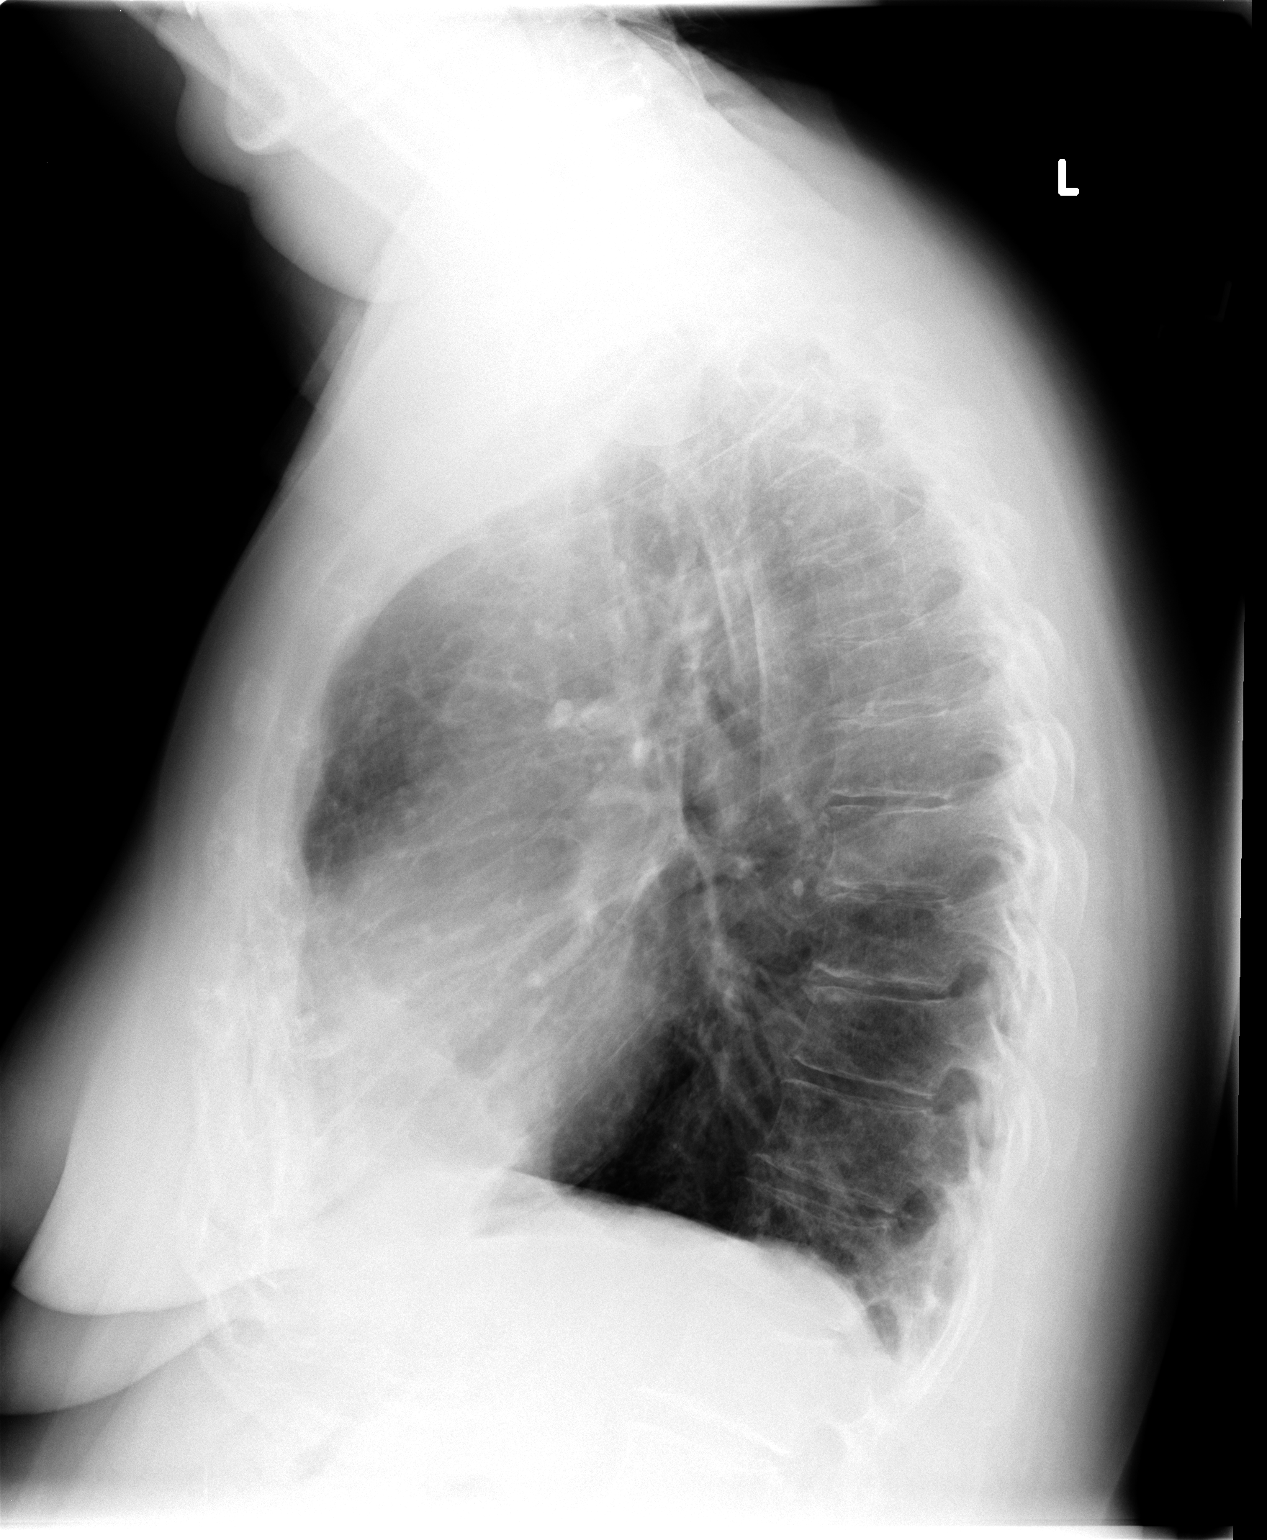

[2 of 2 positions shown; findings below may reference images not displayed]

FINDINGS: Heart and mediastinal contours are stable with
calcification again noted within the aortic arch.  The lung fields
demonstrate prominence of the interstitial markings diffusely
throughout both lung fields compatible with chronic bronchitic
change.  Bibasilar scarring is noted and unchanged.  The lung
fields otherwise appear clear with no signs of focal infiltrate or
congestive failure evident.  No pleural fluid is seen and no
significant peribronchial cuffing is identified.

Bony structures appear intact with overall diminished bone density.
Screw plate fixation of the lower cervical spine is seen.
IMPRESSION: Stable chronic changes at the lung bases.  No new focal or acute
abnormality identified.

## 2013-01-03 DIAGNOSIS — Z8701 Personal history of pneumonia (recurrent): Secondary | ICD-10-CM

## 2013-01-03 HISTORY — DX: Personal history of pneumonia (recurrent): Z87.01

## 2013-01-07 ENCOUNTER — Encounter: Payer: Self-pay | Admitting: Critical Care Medicine

## 2013-01-07 ENCOUNTER — Other Ambulatory Visit: Payer: Medicare Other

## 2013-01-07 ENCOUNTER — Ambulatory Visit (INDEPENDENT_AMBULATORY_CARE_PROVIDER_SITE_OTHER): Payer: Medicare Other | Admitting: Critical Care Medicine

## 2013-01-07 VITALS — BP 120/84 | HR 62 | Temp 98.2°F | Ht 62.0 in | Wt 174.2 lb

## 2013-01-07 DIAGNOSIS — D839 Common variable immunodeficiency, unspecified: Secondary | ICD-10-CM

## 2013-01-07 DIAGNOSIS — D801 Nonfamilial hypogammaglobulinemia: Secondary | ICD-10-CM

## 2013-01-07 DIAGNOSIS — J45909 Unspecified asthma, uncomplicated: Secondary | ICD-10-CM

## 2013-01-07 MED ORDER — ZAFIRLUKAST 20 MG PO TABS: 20.0000 mg | ORAL_TABLET | Freq: Two times a day (BID) | ORAL | Status: DC

## 2013-01-07 MED ORDER — PREDNISONE 10 MG PO TABS: 5.0000 mg | ORAL_TABLET | Freq: Every day | ORAL | Status: DC

## 2013-01-07 NOTE — Progress Notes (Signed)
Subjective:    Patient ID: [name], female    DOB: 04-30-1955, 58 y.o.   MRN: [id number]  HPI  58 y.o. .WF former smoker  Asthma , hypogammaglobulinemia, chronic sinusits d/t pseudomonas Hx of Diffuse large B-Cell Lymphoma, S/P R-CHOP x 4 cycles with intrathecal chemotherapy during cycles 3 and 4 with methotrexate and Solu-Cortef.  Treatment was stopped due to Pseudomonas sepsis after cycles 2 and 4 which was very severe.     Rituxan alone  Every month, last dose 09/30/11. Now on Hold. PET due in 01/2012   01/07/2013 Chief Complaint  Patient presents with  . Follow-up    Has good days and bad days.  Has some wheezing, chest tightness, and cough - mostly dry with occas yellow mucus.    Now good and bad days. Notes some wheezing and chest tightness and dry cough, occ prod yellow mucus.  Dyspnea comes and goes.  Symptoms worse with sinus infection.  PCP rx with cipro for sinus in 11/2012.  No recent gammaglobulin infusions recently.  PUL ASTHMA HISTORY 01/07/2013 07/11/2012 05/25/2012 01/25/2012 12/14/2011  Symptoms 0-2 days/week Daily Daily Daily Daily  Nighttime awakenings 0-2/month 0-2/month >1/wk but not nightly Often--7/wk 3-4/month  Interference with activity Minor limitations Some limitations Some limitations Some limitations Some limitations  SABA use 0-2 days/wk > 2 days/wk--not > 1 x/day Daily Several times/day Daily  Exacerbations requiring oral steroids 0-1 / year 2 or more / year 2 or more / year 2 or more / year 2 or more / year     Review of Systems  Constitutional:   No  weight loss, night sweats,  Fevers, chills, fatigue, lassitude. HEENT:   No headaches,  Difficulty swallowing,  Tooth/dental problems,  Sore throat,                No sneezing, itching, ear ache, nasal congestion, post nasal drip,   CV:  No chest pain,  Orthopnea, PND, swelling in lower extremities, anasarca, dizziness, palpitations  GI  No heartburn, indigestion, abdominal pain, nausea, vomiting, diarrhea,  change in bowel habits, loss of appetite  Resp: No shortness of breath with exertion or at rest.  No excess mucus, no productive cough,  No non-productive cough,  No coughing up of blood.  No change in color of mucus.  No wheezing.  No chest wall deformity  Skin: no rash or lesions.  GU: no dysuria, change in color of urine, no urgency or frequency.  No flank pain.  MS:  No joint pain or swelling.  No decreased range of motion.  No back pain.  Psych:  No change in mood or affect. No depression or anxiety.  No memory loss.     Objective:   Physical Exam  Filed Vitals:   01/07/13 1055  BP: 120/84  Pulse: 62  Temp: 98.2 F (36.8 C)  TempSrc: Oral  Height: 5\' 2"  (1.575 m)  Weight: 174 lb 3.2 oz (79.017 kg)  SpO2: 96%    Gen: Pleasant, well-nourished, in no distress,  normal affect  ENT: No lesions,  mouth clear,  oropharynx clear, no postnasal drip, mild nasal inflammation without purulence  Neck: No JVD, no TMG, no carotid bruits  Lungs: No use of accessory muscles, no dullness to percussion, clear without rales or rhonchi  Cardiovascular: RRR, heart sounds normal, no murmur or gallops, no peripheral edema  Abdomen: soft and NT, no HSM,  BS normal  Musculoskeletal: No deformities, no cyanosis or clubbing  Neuro: alert, non  focal  Skin: Warm, no lesions or rashes  No results found.     Assessment & Plan:   Severe persistent asthma with chronic sinusitis Severe persistent asthma with chronic sinusitis stable at this time History of hypogammaglobulinemia which is contributing to the patient's frequency of infection rate Plan Check gammaglobulin level today No change in medications Return 3  Months Refills sent on accolate and prednisone    Updated Medication List Outpatient Encounter Prescriptions as of 01/07/2013  Medication Sig  . acetaminophen (TYLENOL) 500 MG tablet Take 1,000 mg by mouth every 6 (six) hours as needed for pain or fever.   [name] Kitchen acyclovir  (ZOVIRAX) 400 MG tablet Take 1 tablet twice daily  . albuterol (PROVENTIL HFA;VENTOLIN HFA) 108 (90 BASE) MCG/ACT inhaler Inhale 2 puffs into the lungs every 6 (six) hours as needed for wheezing or shortness of breath.  [name] Kitchen albuterol (PROVENTIL) (2.5 MG/3ML) 0.083% nebulizer solution Take 2.5 mg by nebulization every 4 (four) hours as needed for wheezing or shortness of breath.  . benzonatate (TESSALON) 200 MG capsule Take 200 mg by mouth 3 (three) times daily as needed for cough.  . citalopram (CELEXA) 20 MG tablet Take 20 mg by mouth every morning.    . fexofenadine (ALLEGRA) 180 MG tablet Take 180 mg by mouth daily as needed.   . fluticasone (FLONASE) 50 MCG/ACT nasal spray Place 2 sprays into the nose 2 (two) times daily.  . furosemide (LASIX) 20 MG tablet Take 1 tablet by mouth every other day as needed. *Usually takes as needed for fluid retention upon weighing in**  . mometasone-formoterol (DULERA) 200-5 MCG/ACT AERO Inhale 2 puffs into the lungs 2 (two) times daily.  [name] Kitchen omeprazole (PRILOSEC) 20 MG capsule Take 1 capsule (20 mg total) by mouth 2 (two) times daily.  . predniSONE (DELTASONE) 10 MG tablet Take 0.5 tablets (5 mg total) by mouth daily.  . simvastatin (ZOCOR) 80 MG tablet Take 80 mg by mouth at bedtime.    . vitamin C (ASCORBIC ACID) 500 MG tablet Take 500 mg by mouth 2 (two) times daily.  [name] Kitchen warfarin (COUMADIN) 10 MG tablet Take 5 mg by mouth daily. Take 1/2 tablet (5mg ) daily  . zafirlukast (ACCOLATE) 20 MG tablet Take 1 tablet (20 mg total) by mouth 2 (two) times daily.  . [DISCONTINUED] predniSONE (DELTASONE) 10 MG tablet Take 5 mg by mouth daily.  . [DISCONTINUED] zafirlukast (ACCOLATE) 20 MG tablet Take 1 tablet (20 mg total) by mouth 2 (two) times daily.

## 2013-01-07 NOTE — Assessment & Plan Note (Signed)
Severe persistent asthma with chronic sinusitis stable at this time History of hypogammaglobulinemia which is contributing to the patient's frequency of infection rate Plan Check gammaglobulin level today No change in medications Return 3  Months Refills sent on accolate and prednisone

## 2013-01-07 NOTE — Patient Instructions (Addendum)
Check gammaglobulin level today No change in medications Return 3  Months Refills sent on accolate and prednisone

## 2013-01-08 ENCOUNTER — Telehealth: Payer: Self-pay | Admitting: Critical Care Medicine

## 2013-01-08 LAB — IGG: IgG (Immunoglobin G), Serum: 525 mg/dL — ABNORMAL LOW (ref 690–1700)

## 2013-01-08 NOTE — Progress Notes (Signed)
Quick Note:  lmomtcb for pt ______ 

## 2013-01-08 NOTE — Telephone Encounter (Signed)
Notes Recorded by Elsie Stain, MD on 01/08/2013 at 9:25 AM Call pt IgG level low she needs IV IgG Historically oncology in [place] will administer to the patient  ------  Called, spoke with pt.  Informed her of above results and recs per Dr. Joya Gaskins.  Pt has received this with [facility] in the past.  She is aware I will contact them regarding ordering this, and they will call her once it is set up.  She verbalized understanding of this and voiced no further questions or concerns at this time.    Called [facility], spoke with Lynelle Smoke, nurse with [facility] Mussel, [place].  She will discuss this with him.  They will set this up and will call pt once scheduled.

## 2013-01-08 NOTE — Progress Notes (Signed)
Quick Note:  Spoke with pt. Informed her of results and recs per Dr. Joya Gaskins. She verbalized understanding and is aware I will contact Tom, [place] office to have IV IgG set up. This have been discussed with Tammy at the [facility] office. They will set IV IgG up and will contact pt once scheduled. ______

## 2013-01-09 ENCOUNTER — Other Ambulatory Visit (HOSPITAL_COMMUNITY): Payer: Self-pay | Admitting: Oncology

## 2013-01-15 ENCOUNTER — Encounter (HOSPITAL_COMMUNITY): Payer: Medicare Other | Attending: Hematology and Oncology

## 2013-01-15 ENCOUNTER — Encounter (HOSPITAL_COMMUNITY): Payer: Self-pay

## 2013-01-15 ENCOUNTER — Encounter (HOSPITAL_BASED_OUTPATIENT_CLINIC_OR_DEPARTMENT_OTHER): Payer: Medicare Other

## 2013-01-15 VITALS — BP 127/67 | HR 56 | Temp 97.4°F | Resp 20 | Wt 174.8 lb

## 2013-01-15 DIAGNOSIS — D809 Immunodeficiency with predominantly antibody defects, unspecified: Secondary | ICD-10-CM

## 2013-01-15 DIAGNOSIS — C8590 Non-Hodgkin lymphoma, unspecified, unspecified site: Secondary | ICD-10-CM

## 2013-01-15 DIAGNOSIS — D801 Nonfamilial hypogammaglobulinemia: Secondary | ICD-10-CM | POA: Insufficient documentation

## 2013-01-15 DIAGNOSIS — C8589 Other specified types of non-Hodgkin lymphoma, extranodal and solid organ sites: Secondary | ICD-10-CM

## 2013-01-15 LAB — COMPREHENSIVE METABOLIC PANEL
ALK PHOS: 103 U/L (ref 39–117)
ALT: 12 U/L (ref 0–35)
AST: 15 U/L (ref 0–37)
Albumin: 3.6 g/dL (ref 3.5–5.2)
BUN: 16 mg/dL (ref 6–23)
CO2: 29 meq/L (ref 19–32)
Calcium: 9.4 mg/dL (ref 8.4–10.5)
Chloride: 102 mEq/L (ref 96–112)
Creatinine, Ser: 1.02 mg/dL (ref 0.50–1.10)
GFR calc non Af Amer: 60 mL/min — ABNORMAL LOW (ref >90)
GFR, EST AFRICAN AMERICAN: 69 mL/min — AB (ref >90)
GLUCOSE: 88 mg/dL (ref 70–99)
Potassium: 4.7 mEq/L (ref 3.7–5.3)
SODIUM: 143 meq/L (ref 137–147)
Total Bilirubin: 0.3 mg/dL (ref 0.3–1.2)
Total Protein: 6.9 g/dL (ref 6.0–8.3)

## 2013-01-15 LAB — CBC WITH DIFFERENTIAL/PLATELET
Basophils Absolute: 0 10*3/uL (ref 0.0–0.1)
Basophils Relative: 0 % (ref 0–1)
EOS PCT: 1 % (ref 0–5)
Eosinophils Absolute: 0.1 10*3/uL (ref 0.0–0.7)
HEMATOCRIT: 36.5 % (ref 36.0–46.0)
Hemoglobin: 12.1 g/dL (ref 12.0–15.0)
LYMPHS ABS: 0.8 10*3/uL (ref 0.7–4.0)
LYMPHS PCT: 7 % — AB (ref 12–46)
MCH: 30.2 pg (ref 26.0–34.0)
MCHC: 33.2 g/dL (ref 30.0–36.0)
MCV: 91 fL (ref 78.0–100.0)
MONO ABS: 0.5 10*3/uL (ref 0.1–1.0)
Monocytes Relative: 4 % (ref 3–12)
Neutro Abs: 9.3 10*3/uL — ABNORMAL HIGH (ref 1.7–7.7)
Neutrophils Relative %: 88 % — ABNORMAL HIGH (ref 43–77)
Platelets: 201 10*3/uL (ref 150–400)
RBC: 4.01 MIL/uL (ref 3.87–5.11)
RDW: 13.9 % (ref 11.5–15.5)
WBC: 10.6 10*3/uL — AB (ref 4.0–10.5)

## 2013-01-15 LAB — RETICULOCYTES
RBC.: 4.01 MIL/uL (ref 3.87–5.11)
RETIC COUNT ABSOLUTE: 56.1 10*3/uL (ref 19.0–186.0)
Retic Ct Pct: 1.4 % (ref 0.4–3.1)

## 2013-01-15 LAB — LACTATE DEHYDROGENASE: LDH: 190 U/L (ref 94–250)

## 2013-01-15 MED ORDER — IMMUNE GLOBULIN (HUMAN) 10 GM/200ML IV SOLN
10.0000 g | Freq: Once | INTRAVENOUS | Status: AC
  Administered 2013-01-15: 10 g via INTRAVENOUS
  Filled 2013-01-15: qty 200

## 2013-01-15 MED ORDER — DEXTROSE 5 % IV SOLN
INTRAVENOUS | Status: DC
  Administered 2013-01-15: 09:00:00 via INTRAVENOUS

## 2013-01-15 MED ORDER — IMMUNE GLOBULIN (HUMAN) 10 GM/100ML IV SOLN: 400.0000 mg/kg | Freq: Once | INTRAVENOUS | Status: DC

## 2013-01-15 MED ORDER — IMMUNE GLOBULIN (HUMAN) 10 GM/200ML IV SOLN
20.0000 g | Freq: Once | INTRAVENOUS | Status: AC
  Administered 2013-01-15: 20 g via INTRAVENOUS
  Filled 2013-01-15: qty 400

## 2013-01-15 MED ORDER — ACETAMINOPHEN 325 MG PO TABS
650.0000 mg | ORAL_TABLET | Freq: Once | ORAL | Status: AC
  Administered 2013-01-15: 650 mg via ORAL
  Filled 2013-01-15: qty 2

## 2013-01-15 NOTE — Progress Notes (Signed)
[facility]  OFFICE PROGRESS NOTE  Leonides Grills, MD [address] [place] [address]  DIAGNOSIS: NHL (non-Hodgkin's lymphoma) - Plan: CBC with Differential, Reticulocytes, Comprehensive metabolic panel, Lactate dehydrogenase, Beta 2 microglobuline, serum  IgG deficiency  Chief Complaint  Patient presents with  . IgG deficiency    CURRENT THERAPY: IgG monthly for IgG deficiency  INTERVAL HISTORY: [name] 58 y.o. female returns for initiation of maintenance IgG therapy for IgG deficiency having been treated previously for diffuse large B cell lymphoma, stage IV BY Pseudomonas sepsis having received only 4 cycles along with intrathecal methotrexate during cycles 3 and 4.  She is scheduled to get 30 g of intravenous gammaglobulin today. She does have problems with recurrent sinus infections treated with antibiotics. She continues to use intranasal steroids as well as oral leukotriene inhibitors. Appetite been good with no diarrhea, constipation, melena, hematochezia, hematuria, headache, earache, sore throat, dysuria, vaginal discharge, lower 70 swelling or redness, skin rash, cellulitis, headache, or seizures.  MEDICAL HISTORY: Past Medical History  Diagnosis Date  . Non Hodgkin's lymphoma   . Allergic rhinitis   . GERD (gastroesophageal reflux disease)   . Asthma   . Chronic sinusitis   . Respiratory failure   . Cavitary lung disease   . PONV (postoperative nausea and vomiting)   . Endotracheally intubated   . Myocardial infarction 2011  . Anxiety   . Chronic kidney disease   . 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   diffuse large B cell lymphoma, stage IV B. status post R. CHOP x4 cycles (08/14/2009- 10/23/2009) along with intrathecal chemotherapy during cycles 3 and 4 with methotrexate, 12 mg and SoluCortef 50 mg. Her chemotherapy had to be stopped due to severe Pseudomonas sepsis after cycles 2 and cycles 4. These episodes were life-threatening. Thus far she has no evidence for recurrent disease. Because of low level of IgG, maintenance therapy with intravenous gammaglobulin will be given monthly beginning on 01/15/2013  ALLERGIES:  is allergic to meperidine hcl and montelukast sodium.  MEDICATIONS: has a current medication list which includes the following prescription(s): acetaminophen, acyclovir, albuterol, albuterol, citalopram, fexofenadine, fluticasone, furosemide, mometasone-formoterol, omeprazole, prednisone, simvastatin, vitamin c, warfarin, zafirlukast, and benzonatate, and the following Facility-Administered Medications: dextrose, immune globulin 5%, immune globulin 5%, and immune globulin 5%.  SURGICAL HISTORY:  Past Surgical History  Procedure Laterality Date  . Vesicovaginal fistula closure w/ tah    . Nasal sinus surgery    . Neck surgery      x 2   . Portacath placement  7/11    Removed 6/12  . Basal cell carcinoma excision  03/2011    scalp  . Port-a-cath removal    . Peripherally inserted central catheter insertion    . Picc removal    . Tracheostomy    . Abdominal hysterectomy    . Lung lobectomy      right  . Breast surgery Right   . Colonoscopy N/A 11/14/2012    Procedure: COLONOSCOPY;  Surgeon: Rogene Houston, MD;  Location: [facility];  Service: Endoscopy;  Laterality: N/A;  830    FAMILY HISTORY: family history includes Allergies in her father; Asthma  in her father; Diabetes in her brother; Emphysema in her mother; Leukemia in her maternal grandmother; Stroke in her mother.  SOCIAL HISTORY:  reports that she has never smoked. She has never  used smokeless tobacco. She reports that she does not drink alcohol or use illicit drugs.  REVIEW OF SYSTEMS:  Other than that discussed above is noncontributory.  PHYSICAL EXAMINATION: ECOG PERFORMANCE STATUS: 1 - Symptomatic but completely ambulatory  There were no vitals taken for this visit.  GENERAL:alert, no distress and comfortable SKIN: skin color, texture, turgor are normal, no rashes or significant lesions EYES: PERLA; Conjunctiva are pink and non-injected, sclera clear NOSE: Bilateral rhinorrhea with no sinus tenderness. OROPHARYNX:no exudate, no erythema on lips, buccal mucosa, or tongue. NECK: supple, thyroid normal size, non-tender, without nodularity. No masses CHEST: Normal AP diameter with no breast masses. LifePort. LYMPH:  no palpable lymphadenopathy in the cervical, axillary or inguinal LUNGS: clear to auscultation and percussion with normal breathing effort HEART: regular rate & rhythm and no murmurs. ABDOMEN:abdomen soft, non-tender and normal bowel sounds MUSCULOSKELETAL:no cyanosis of digits and no clubbing. Range of motion normal.  NEURO: alert & oriented x 3 with fluent speech, no focal motor/sensory deficits   LABORATORY DATA: Infusion on 01/15/2013  Component Date Value Range Status  . WBC 01/15/2013 10.6* 4.0 - 10.5 K/uL Final  . RBC 01/15/2013 4.01  3.87 - 5.11 MIL/uL Final  . Hemoglobin 01/15/2013 12.1  12.0 - 15.0 g/dL Final  . HCT 01/15/2013 36.5  36.0 - 46.0 % Final  . MCV 01/15/2013 91.0  78.0 - 100.0 fL Final  . MCH 01/15/2013 30.2  26.0 - 34.0 pg Final  . MCHC 01/15/2013 33.2  30.0 - 36.0 g/dL Final  . RDW 01/15/2013 13.9  11.5 - 15.5 % Final  . Platelets 01/15/2013 201  150 - 400 K/uL Final  . Neutrophils Relative % 01/15/2013 88* 43 - 77 % Final  . Neutro Abs 01/15/2013 9.3* 1.7 - 7.7 K/uL Final  . Lymphocytes Relative 01/15/2013 7* 12 - 46 % Final  . Lymphs Abs 01/15/2013 0.8  0.7 - 4.0 K/uL Final  . Monocytes Relative 01/15/2013 4  3 -  12 % Final  . Monocytes Absolute 01/15/2013 0.5  0.1 - 1.0 K/uL Final  . Eosinophils Relative 01/15/2013 1  0 - 5 % Final  . Eosinophils Absolute 01/15/2013 0.1  0.0 - 0.7 K/uL Final  . Basophils Relative 01/15/2013 0  0 - 1 % Final  . Basophils Absolute 01/15/2013 0.0  0.0 - 0.1 K/uL Final  . Retic Ct Pct 01/15/2013 1.4  0.4 - 3.1 % Final  . RBC. 01/15/2013 4.01  3.87 - 5.11 MIL/uL Final  . Retic Count, Manual 01/15/2013 56.1  19.0 - 186.0 K/uL Final  . Sodium 01/15/2013 143  137 - 147 mEq/L Final  . Potassium 01/15/2013 4.7  3.7 - 5.3 mEq/L Final  . Chloride 01/15/2013 102  96 - 112 mEq/L Final  . CO2 01/15/2013 29  19 - 32 mEq/L Final  . Glucose, Bld 01/15/2013 88  70 - 99 mg/dL Final  . BUN 01/15/2013 16  6 - 23 mg/dL Final  . Creatinine, Ser 01/15/2013 1.02  0.50 - 1.10 mg/dL Final  . Calcium 01/15/2013 9.4  8.4 - 10.5 mg/dL Final  . Total Protein 01/15/2013 6.9  6.0 - 8.3 g/dL Final  . Albumin 01/15/2013 3.6  3.5 - 5.2 g/dL Final  . AST 01/15/2013 15  0 - 37 U/L Final  . ALT  01/15/2013 12  0 - 35 U/L Final  . Alkaline Phosphatase 01/15/2013 103  39 - 117 U/L Final  . Total Bilirubin 01/15/2013 0.3  0.3 - 1.2 mg/dL Final  . GFR calc non Af Amer 01/15/2013 60* >90 mL/min Final  . GFR calc Af Amer 01/15/2013 69* >90 mL/min Final   Comment: (NOTE)                          The eGFR has been calculated using the CKD EPI equation.                          This calculation has not been validated in all clinical situations.                          eGFR's persistently <90 mL/min signify possible Chronic Kidney                          Disease.  [name] Kitchen LDH 01/15/2013 190  94 - 250 U/L Final  Appointment on 01/07/2013  Component Date Value Range Status  . IgG (Immunoglobin G), Serum 01/07/2013 525* 690 - 1700 mg/dL Final    PATHOLOGY: No new pathology.  Urinalysis    Component Value Date/Time   COLORURINE YELLOW 08/03/2011 1743   APPEARANCEUR CLEAR 08/03/2011 1743   LABSPEC 1.025  08/03/2011 1743   PHURINE 6.0 08/03/2011 1743   GLUCOSEU NEGATIVE 08/03/2011 1743   HGBUR SMALL* 08/03/2011 1743   BILIRUBINUR NEGATIVE 08/03/2011 1743   KETONESUR NEGATIVE 08/03/2011 1743   PROTEINUR NEGATIVE 08/03/2011 1743   UROBILINOGEN 0.2 08/03/2011 1743   NITRITE NEGATIVE 08/03/2011 1743   LEUKOCYTESUR NEGATIVE 08/03/2011 1743    RADIOGRAPHIC STUDIES: No results found.  ASSESSMENT:   #1. IgG deficiency, for 30 g of IVIG today. Repeat IgG level following infusion to determine whether or not dose is adequate. #2. Diffuse large B-cell lymphoma, no evidence of disease. Stage IV B. status post R. CHOP x4 cycles (08/14/2009- 10/23/2009) along with intrathecal chemotherapy during cycles 3 and 4 with methotrexate, 12 mg and SoluCortef 50 mg. Her chemotherapy had to be stopped due to severe Pseudomonas sepsis after cycles 2 and cycles 4. These episodes were life-threatening. #3. Recurrent sinus infections. #4. Allergic rhinitis and asthma, on treatment    PLAN:  #1. 30 g IgG intravenously today. Repeat IgG level following the infusion. #2. Followup in one month with IgG level and intravenous IgG, dose dependent on whether or not today is infusion was successful in normalizing the results.   All questions were answered. The patient knows to call the clinic with any problems, questions or concerns. We can certainly see the patient much sooner if necessary.   I spent 25 minutes counseling the patient face to face. The total time spent in the appointment was 30 minutes.    Doroteo Bradford, MD 01/15/2013 9:52 AM

## 2013-01-15 NOTE — Progress Notes (Signed)
Tolerated infusion well.  Alert, in no distress.

## 2013-01-16 LAB — BETA 2 MICROGLOBULIN, SERUM: Beta-2 Microglobulin: 1.98 mg/L — ABNORMAL HIGH (ref 1.01–1.73)

## 2013-02-12 ENCOUNTER — Encounter (HOSPITAL_BASED_OUTPATIENT_CLINIC_OR_DEPARTMENT_OTHER): Payer: Medicare Other

## 2013-02-12 ENCOUNTER — Encounter (HOSPITAL_COMMUNITY): Payer: Self-pay

## 2013-02-12 ENCOUNTER — Encounter (HOSPITAL_COMMUNITY): Payer: Medicare Other | Attending: Hematology and Oncology

## 2013-02-12 ENCOUNTER — Encounter (HOSPITAL_COMMUNITY): Payer: Medicare Other

## 2013-02-12 VITALS — BP 118/68 | HR 66 | Temp 98.1°F | Resp 18 | Wt 171.0 lb

## 2013-02-12 DIAGNOSIS — D809 Immunodeficiency with predominantly antibody defects, unspecified: Secondary | ICD-10-CM

## 2013-02-12 DIAGNOSIS — D801 Nonfamilial hypogammaglobulinemia: Secondary | ICD-10-CM | POA: Insufficient documentation

## 2013-02-12 DIAGNOSIS — D803 Selective deficiency of immunoglobulin G [IgG] subclasses: Secondary | ICD-10-CM

## 2013-02-12 DIAGNOSIS — G620 Drug-induced polyneuropathy: Secondary | ICD-10-CM

## 2013-02-12 DIAGNOSIS — C8589 Other specified types of non-Hodgkin lymphoma, extranodal and solid organ sites: Secondary | ICD-10-CM

## 2013-02-12 DIAGNOSIS — C8590 Non-Hodgkin lymphoma, unspecified, unspecified site: Secondary | ICD-10-CM

## 2013-02-12 LAB — CBC WITH DIFFERENTIAL/PLATELET
Basophils Absolute: 0 10*3/uL (ref 0.0–0.1)
Basophils Relative: 0 % (ref 0–1)
EOS ABS: 0.1 10*3/uL (ref 0.0–0.7)
EOS PCT: 1 % (ref 0–5)
HEMATOCRIT: 38.2 % (ref 36.0–46.0)
Hemoglobin: 12.3 g/dL (ref 12.0–15.0)
Lymphocytes Relative: 11 % — ABNORMAL LOW (ref 12–46)
Lymphs Abs: 0.8 10*3/uL (ref 0.7–4.0)
MCH: 29.3 pg (ref 26.0–34.0)
MCHC: 32.2 g/dL (ref 30.0–36.0)
MCV: 91 fL (ref 78.0–100.0)
MONO ABS: 0.6 10*3/uL (ref 0.1–1.0)
MONOS PCT: 9 % (ref 3–12)
Neutro Abs: 5.6 10*3/uL (ref 1.7–7.7)
Neutrophils Relative %: 79 % — ABNORMAL HIGH (ref 43–77)
PLATELETS: 149 10*3/uL — AB (ref 150–400)
RBC: 4.2 MIL/uL (ref 3.87–5.11)
RDW: 14.6 % (ref 11.5–15.5)
WBC: 7.1 10*3/uL (ref 4.0–10.5)

## 2013-02-12 LAB — RETICULOCYTES
RBC.: 4.2 MIL/uL (ref 3.87–5.11)
Retic Count, Absolute: 37.8 10*3/uL (ref 19.0–186.0)
Retic Ct Pct: 0.9 % (ref 0.4–3.1)

## 2013-02-12 MED ORDER — ACETAMINOPHEN 325 MG PO TABS
650.0000 mg | ORAL_TABLET | Freq: Once | ORAL | Status: AC
  Administered 2013-02-12: 650 mg via ORAL
  Filled 2013-02-12: qty 2

## 2013-02-12 MED ORDER — IMMUNE GLOBULIN (HUMAN) 10 GM/100ML IV SOLN
400.0000 mg/kg | Freq: Once | INTRAVENOUS | Status: AC
  Administered 2013-02-12: 30 g via INTRAVENOUS
  Filled 2013-02-12: qty 100

## 2013-02-12 NOTE — Progress Notes (Signed)
Tolerated well

## 2013-02-12 NOTE — Progress Notes (Signed)
Galt M, MD [address] [place] [address]  DIAGNOSIS: IgG deficiency - Plan: IgG, IgA, IgM, IgG, IgA, IgM  NHL (non-Hodgkin's lymphoma) - Plan: CBC with Differential, Reticulocytes  Chief Complaint  Patient presents with  . IgG deficiency  . History of large cell lymphoma    CURRENT THERAPY: IgG monthly for IgG deficiency  INTERVAL HISTORY: [name] 58 y.o. female returns for continuation of maintenance IgG therapy to maintain normal levels after definitive treatment for diffuse large B-cell lymphoma complicated by pseudomonas sepsis having received only 4 cycles of chemotherapy along with intrathecal methotrexate during cycles 3 and 4. She did receive 30 g of gamma globulin one month ago.  She was started on Augmentin by her family physician within the last month because of "pneumonia". No chest x-ray was done. She still has chronic cough with occasional wheezing but does take medication daily with albuterol as needed. Appetite been good with no fever or night sweats. She did have diarrhea with Augmentin. This was relieved with eating or work. She denies any lower 70 swelling but does have peripheral paresthesias involving the lower extremities that are sometimes extremely uncomfortable and resulted in almost rubbery feeling of both of her lower extremities. She is desirous of attempting a treatment to control that symptom.  MEDICAL HISTORY: Past Medical History  Diagnosis Date  . Non Hodgkin's lymphoma   . Allergic rhinitis   . GERD (gastroesophageal reflux disease)   . Asthma   . Chronic sinusitis   . Respiratory failure   . Cavitary lung disease   . PONV (postoperative nausea and vomiting)   . Endotracheally intubated   . Myocardial infarction 2011  . Anxiety   . Chronic kidney disease   . Anemia   . 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   diffuse large B cell lymphoma, stage IV B. status post R. CHOP x4 cycles (08/14/2009- 10/23/2009) along with intrathecal chemotherapy during cycles 3 and 4 with methotrexate, 12 mg and SoluCortef 50 mg. Her chemotherapy had to be stopped due to severe Pseudomonas sepsis after cycles 2 and cycles 4. These episodes were life-threatening. Thus far she has no evidence for recurrent disease.  Because of low level of IgG, maintenance therapy with intravenous gammaglobulin will be given monthly beginning on 01/15/2013     ALLERGIES:  is allergic to meperidine hcl and montelukast sodium.  MEDICATIONS: has a current medication list which includes the following prescription(s): acetaminophen, acyclovir, albuterol, albuterol, benzonatate, citalopram, fexofenadine, fluticasone, furosemide, mometasone-formoterol, omeprazole, prednisone, simvastatin, vitamin c, warfarin, and zafirlukast, and the following Facility-Administered Medications: immune globulin 5% and immune globulin 5%.  SURGICAL HISTORY:  Past Surgical History  Procedure Laterality Date  . Vesicovaginal fistula closure w/ tah    . Nasal sinus surgery    . Neck surgery      x 2   . Portacath placement  7/11    Removed 6/12  . Basal cell carcinoma excision  03/2011    scalp  . Port-a-cath removal    . Peripherally inserted central catheter insertion    . Picc removal    . Tracheostomy    .  Abdominal hysterectomy    . Lung lobectomy      right  . Breast surgery Right   . Colonoscopy N/A 11/14/2012    Procedure: COLONOSCOPY;  Surgeon: Rogene Houston, MD;  Location: [facility];  Service: Endoscopy;  Laterality: N/A;  830     FAMILY HISTORY: family history includes Allergies in her father; Asthma in her father; Diabetes in her brother; Emphysema in her mother; Leukemia in her maternal grandmother; Stroke in her mother.  SOCIAL HISTORY:  reports that she has never smoked. She has never used smokeless tobacco. She reports that she does not drink alcohol or use illicit drugs.  REVIEW OF SYSTEMS:  Other than that discussed above is noncontributory.  PHYSICAL EXAMINATION: ECOG PERFORMANCE STATUS: 1 - Symptomatic but completely ambulatory  There were no vitals taken for this visit.  GENERAL:alert, no distress and comfortable SKIN: skin color, texture, turgor are normal, no rashes or significant lesions EYES: PERLA; Conjunctiva are pink and non-injected, sclera clear OROPHARYNX:no exudate, no erythema on lips, buccal mucosa, or tongue. NECK: supple, thyroid normal size, non-tender, without nodularity. No masses CHEST: Normal AP diameter with previous life port site well-healed. LYMPH:  no palpable lymphadenopathy in the cervical, axillary or inguinal LUNGS: Minimal extra crackles at the bases with wheezing. HEART: regular rate & rhythm and no murmurs. ABDOMEN:abdomen soft, non-tender and normal bowel sounds MUSCULOSKELETAL:no cyanosis of digits and no clubbing. Range of motion normal.  NEURO: alert & oriented x 3 with fluent speech, no focal motor/sensory deficits   LABORATORY DATA: Infusion on 01/15/2013  Component Date Value Range Status  . WBC 01/15/2013 10.6* 4.0 - 10.5 K/uL Final  . RBC 01/15/2013 4.01  3.87 - 5.11 MIL/uL Final  . Hemoglobin 01/15/2013 12.1  12.0 - 15.0 g/dL Final  . HCT 01/15/2013 36.5  36.0 - 46.0 % Final  . MCV 01/15/2013 91.0  78.0 - 100.0 fL Final  . MCH 01/15/2013 30.2  26.0 - 34.0 pg Final  . MCHC 01/15/2013 33.2  30.0 - 36.0 g/dL Final  . RDW 01/15/2013 13.9  11.5 - 15.5 % Final  . Platelets 01/15/2013 201  150 - 400 K/uL Final  . Neutrophils Relative % 01/15/2013 88* 43 -  77 % Final  . Neutro Abs 01/15/2013 9.3* 1.7 - 7.7 K/uL Final  . Lymphocytes Relative 01/15/2013 7* 12 - 46 % Final  . Lymphs Abs 01/15/2013 0.8  0.7 - 4.0 K/uL Final  . Monocytes Relative 01/15/2013 4  3 - 12 % Final  . Monocytes Absolute 01/15/2013 0.5  0.1 - 1.0 K/uL Final  . Eosinophils Relative 01/15/2013 1  0 - 5 % Final  . Eosinophils Absolute 01/15/2013 0.1  0.0 - 0.7 K/uL Final  . Basophils Relative 01/15/2013 0  0 - 1 % Final  . Basophils Absolute 01/15/2013 0.0  0.0 - 0.1 K/uL Final  . Retic Ct Pct 01/15/2013 1.4  0.4 - 3.1 % Final  . RBC. 01/15/2013 4.01  3.87 - 5.11 MIL/uL Final  . Retic Count, Manual 01/15/2013 56.1  19.0 - 186.0 K/uL Final  . Sodium 01/15/2013 143  137 - 147 mEq/L Final  . Potassium 01/15/2013 4.7  3.7 - 5.3 mEq/L Final  . Chloride 01/15/2013 102  96 - 112 mEq/L Final  . CO2 01/15/2013 29  19 - 32 mEq/L Final  . Glucose, Bld 01/15/2013 88  70 - 99 mg/dL Final  . BUN 01/15/2013 16  6 - 23 mg/dL Final  . Creatinine, Ser 01/15/2013  1.02  0.50 - 1.10 mg/dL Final  . Calcium 01/15/2013 9.4  8.4 - 10.5 mg/dL Final  . Total Protein 01/15/2013 6.9  6.0 - 8.3 g/dL Final  . Albumin 01/15/2013 3.6  3.5 - 5.2 g/dL Final  . AST 01/15/2013 15  0 - 37 U/L Final  . ALT 01/15/2013 12  0 - 35 U/L Final  . Alkaline Phosphatase 01/15/2013 103  39 - 117 U/L Final  . Total Bilirubin 01/15/2013 0.3  0.3 - 1.2 mg/dL Final  . GFR calc non Af Amer 01/15/2013 60* >90 mL/min Final  . GFR calc Af Amer 01/15/2013 69* >90 mL/min Final   Comment: (NOTE)                          The eGFR has been calculated using the CKD EPI equation.                          This calculation has not been validated in all clinical situations.                          eGFR's persistently <90 mL/min signify possible Chronic Kidney                          Disease.  [name] Kitchen LDH 01/15/2013 190  94 - 250 U/L Final  . Beta-2 Microglobulin 01/15/2013 1.98* 1.01 - 1.73 mg/L Final   Performed at [facility]: No new pathology.  Urinalysis    Component Value Date/Time   COLORURINE YELLOW 08/03/2011 1743   APPEARANCEUR CLEAR 08/03/2011 1743   LABSPEC 1.025 08/03/2011 1743   PHURINE 6.0 08/03/2011 1743   GLUCOSEU NEGATIVE 08/03/2011 1743   HGBUR SMALL* 08/03/2011 1743   BILIRUBINUR NEGATIVE 08/03/2011 1743   KETONESUR NEGATIVE 08/03/2011 1743   PROTEINUR NEGATIVE 08/03/2011 1743   UROBILINOGEN 0.2 08/03/2011 1743   NITRITE NEGATIVE 08/03/2011 1743   LEUKOCYTESUR NEGATIVE 08/03/2011 1743    RADIOGRAPHIC STUDIES: No results found.  ASSESSMENT:  #1. IgG deficiency, for 30 g of IV IgG today with plans to repeat IgG level after the infusion today to see whether or not the level of IgG in the blood normalizes. This was done at the time of her last infusion. #2. Diffuse large B-cell lymphoma, no evidence of disease. Stage IV B. status post R. CHOP x4 cycles (08/14/2009- 10/23/2009) along with intrathecal chemotherapy during cycles 3 and 4 with methotrexate, 12 mg and SoluCortef 50 mg. Her chemotherapy had to be stopped due to severe Pseudomonas sepsis after cycles 2 and cycles 4. These episodes were life-threatening.  #3. Recurrent sinus infections.  #4. Allergic rhinitis and asthma, on treatment. #5. Peripheral neuropathy from previous chemotherapy.       PLAN:  #1. IV IgG 30 g today. #2. Trial of gabapentin to begin with 300 mg at bedtime and increasing by 300 mg daily weekly until 1200 mg per day is reached or symptoms are controlled. #3. Office visit 1 month with CBC, immunofixation, and additional IV IgG.   All questions were answered. The patient knows to call the clinic with any problems, questions or concerns. We can certainly see the patient much sooner if necessary.   I spent 25 minutes counseling the patient face to face. The total time spent in the appointment was 30 minutes.  Doroteo Bradford, MD 02/12/2013 9:38 AM

## 2013-02-12 NOTE — Patient Instructions (Addendum)
[facility] Discharge Instructions  RECOMMENDATIONS MADE BY THE CONSULTANT AND ANY TEST RESULTS WILL BE SENT TO YOUR REFERRING PHYSICIAN.  Continue IVIG every 4 weeks. MD appointment with next IVIG infusion. A prescription for Gabapentin (neurontin) was sent to your pharmacy. You are to start Gabapentin 1 capsule daily x 1 week. Increase by 1 capsule daily each week up to max of 4 capsules daily or until you have improvement in numbness in feet/legs. Report any issues/concerns to clinic as needed prior to appointment.  Thank you for choosing [facility] to provide your oncology and hematology care.  To afford each patient quality time with our providers, please arrive at least 15 minutes before your scheduled appointment time.  With your help, our goal is to use those 15 minutes to complete the necessary work-up to ensure our physicians have the information they need to help with your evaluation and healthcare recommendations.    Effective January 1st, 2014, we ask that you re-schedule your appointment with our physicians should you arrive 10 or more minutes late for your appointment.  We strive to give you quality time with our providers, and arriving late affects you and other patients whose appointments are after yours.    Again, thank you for choosing [facility].  Our hope is that these requests will decrease the amount of time that you wait before being seen by our physicians.       _____________________________________________________________  Should you have questions after your visit to [facility] please contact our office at [phone] between the hours of 8:30 a.m. and 5:00 p.m.  Voicemails left after 4:30 p.m. will not be returned until the following business day.  For prescription refill requests, have your pharmacy contact our office with your prescription refill request.

## 2013-02-13 ENCOUNTER — Telehealth (HOSPITAL_COMMUNITY): Payer: Self-pay

## 2013-02-13 ENCOUNTER — Other Ambulatory Visit (HOSPITAL_COMMUNITY): Payer: Self-pay | Admitting: Hematology and Oncology

## 2013-02-13 LAB — IGG, IGA, IGM
IgA: 76 mg/dL (ref 69–380)
IgA: 70 mg/dL (ref 69–380)
IgG (Immunoglobin G), Serum: 677 mg/dL — ABNORMAL LOW (ref 690–1700)
IgG (Immunoglobin G), Serum: 1720 mg/dL — ABNORMAL HIGH (ref 690–1700)
IgM, Serum: 78 mg/dL (ref 52–322)
IgM, Serum: 74 mg/dL (ref 52–322)

## 2013-02-13 MED ORDER — GABAPENTIN 300 MG PO CAPS: ORAL_CAPSULE | ORAL | Status: DC

## 2013-02-13 NOTE — Telephone Encounter (Signed)
Patient notified that prescription for gabapentin has been e-scribed.

## 2013-02-13 NOTE — Telephone Encounter (Signed)
Message copied by Mellissa Kohut on Wed Feb 13, 2013  2:47 PM ------      Message from: [place], [place]: Wed Feb 13, 2013  1:26 PM       Please call her back and apologize for me. I ordered the medicine 1 minute ago. ------

## 2013-02-13 NOTE — Telephone Encounter (Signed)
Call from patient stating that Dr. Barnet Glasgow was going to call in prescription for gabapentin.  Has been to [place] twice and so far nothing has been called or escribed.

## 2013-02-14 ENCOUNTER — Other Ambulatory Visit (HOSPITAL_COMMUNITY): Payer: Self-pay | Admitting: Hematology and Oncology

## 2013-02-15 ENCOUNTER — Ambulatory Visit (HOSPITAL_COMMUNITY): Payer: Medicare Other

## 2013-02-23 IMAGING — MR MR HEAD WO/W CM
9 of 12 series · 31 of 48 positions shown · IV contrast (multihance)
Comparison: 11/30/2010 sinus CT.  01/23/2010 head CT.  None:
09/22/2009 MR.

CLINICAL DATA: Headache.  History of non-Hodgkins lymphoma.

MRI HEAD WITHOUT AND WITH CONTRAST
TECHNIQUE: Multiplanar, multiecho pulse sequences of the brain and
surrounding structures were obtained according to standard protocol
without and with intravenous contrast
Contrast: 15mL MULTIHANCE GADOBENATE DIMEGLUMINE 529 MG/ML IV SOLN

[Series 3: DWI · axial · 5.0mm · 0.72mm/px · z∈[-89,+47]mm · 3 of 22 slices shown (1 of 2)]
[im 1/22]
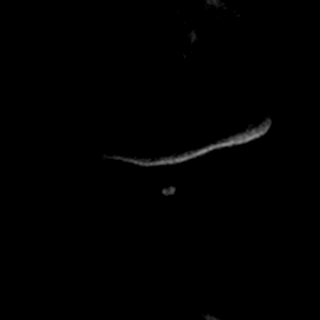
[im 11/22]
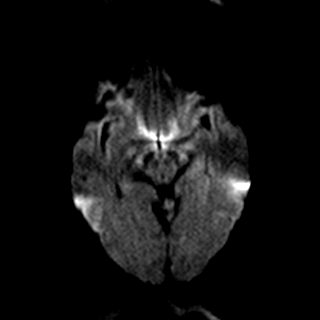
[im 22/22]
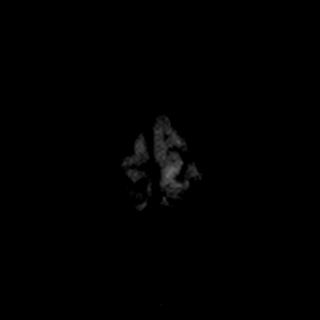

[Series 4: DWI · axial · 5.0mm · 0.72mm/px · z∈[-89,+47]mm · 3 of 22 slices shown (2 of 2)]
[im 1/22]
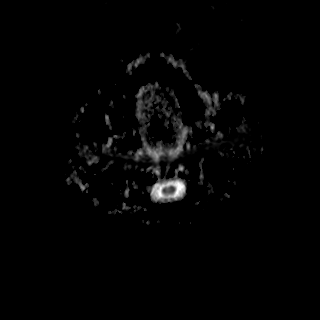
[im 11/22]
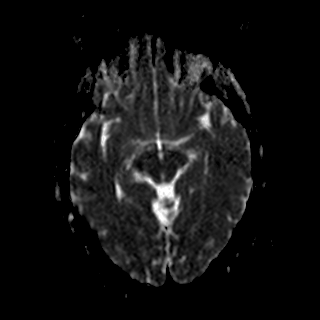
[im 22/22]
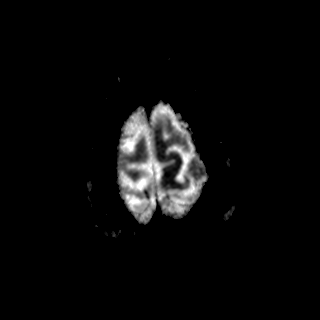

[Series 5: FLAIR · axial · 5.0mm · 0.64mm/px · z∈[-89,+49]mm · 3 of 24 slices shown (1 of 2)]
[im 1/24]
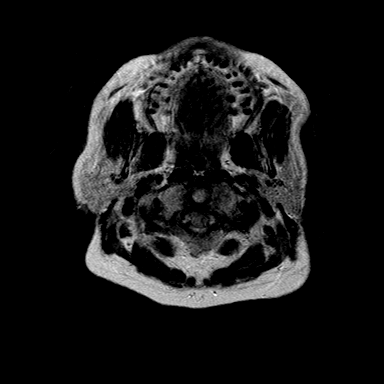
[im 12/24]
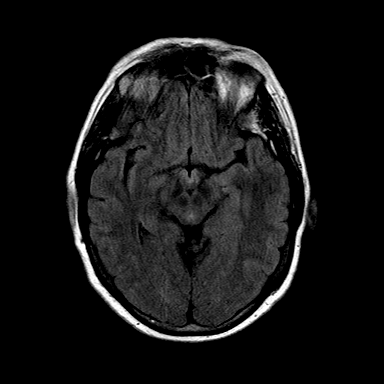
[im 24/24]
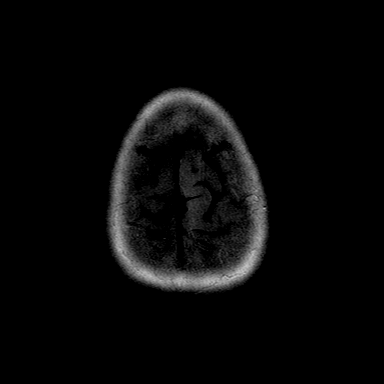

[Series 6: T2 · axial · 5.0mm · 0.57mm/px · z∈[-91,+47]mm · 3 of 24 slices shown (1 of 2)]
[im 1/24]
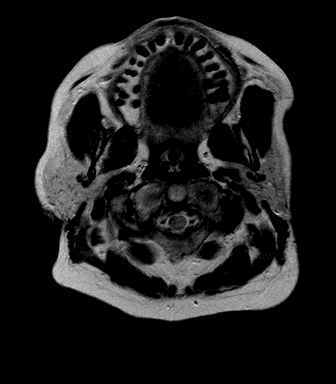
[im 12/24]
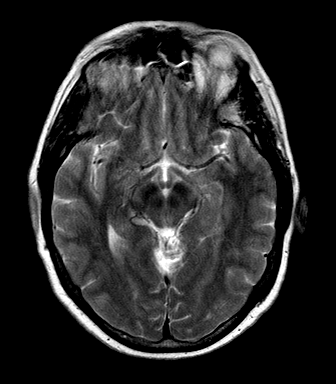
[im 24/24]
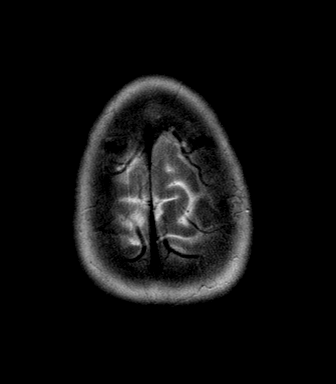

[Series 8: FLAIR · axial · 5.0mm · 0.94mm/px · z∈[-91,+47]mm · 3 of 24 slices shown (2 of 2)]
[im 1/24]
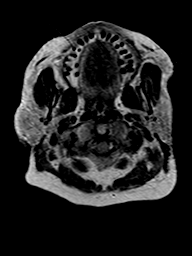
[im 12/24]
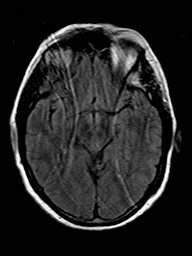
[im 24/24]
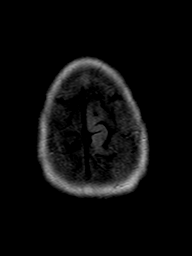

[Series 9: T2 · axial · 5.0mm · 0.44mm/px · z∈[-91,+47]mm · 3 of 24 slices shown (2 of 2)]
[im 1/24]
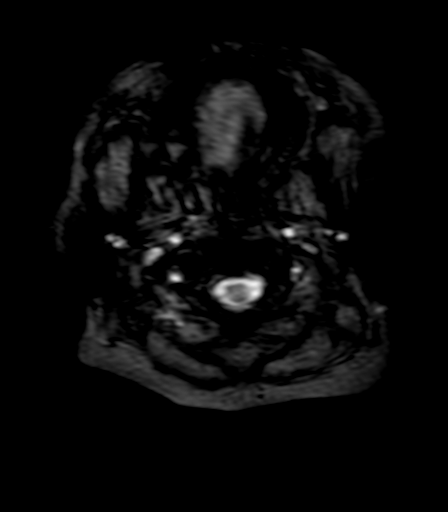
[im 12/24]
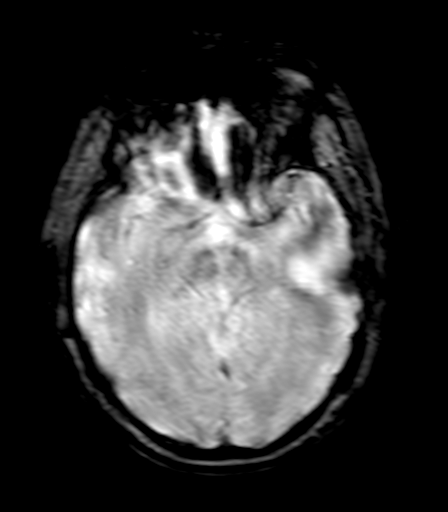
[im 24/24]
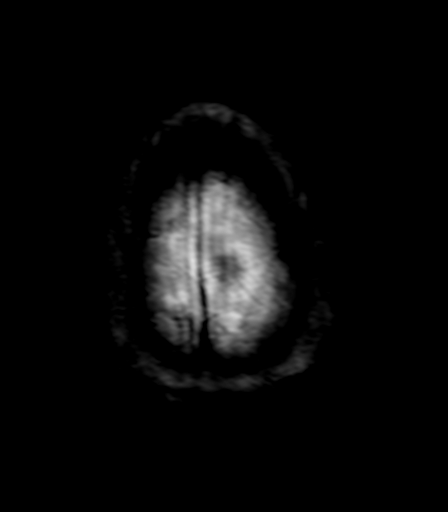

[Series 11: T1 post-contrast · axial · 2.0mm · 0.55mm/px · z∈[-102,+56]mm · 8 of 80 slices shown (1 of 3)]
[im 1/80]
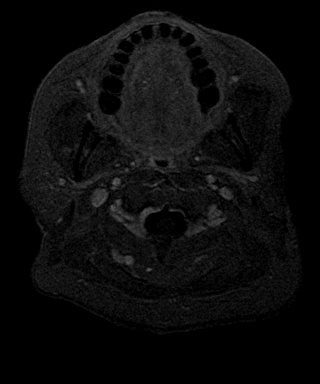
[im 9/80]
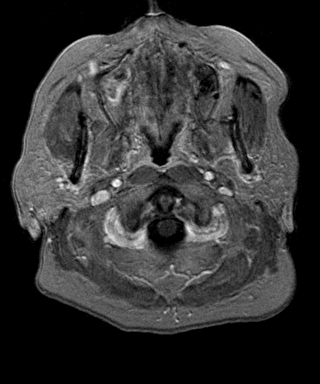
[im 27/80]
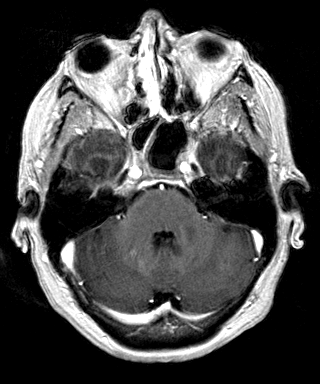
[im 36/80]
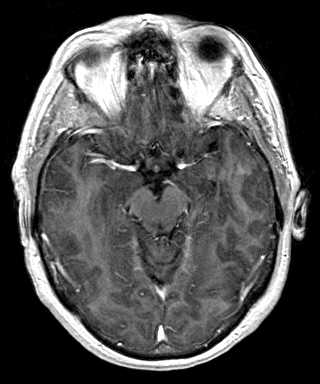
[im 44/80]
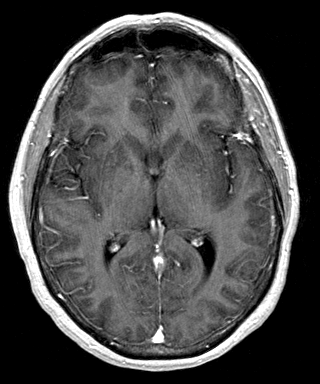
[im 53/80]
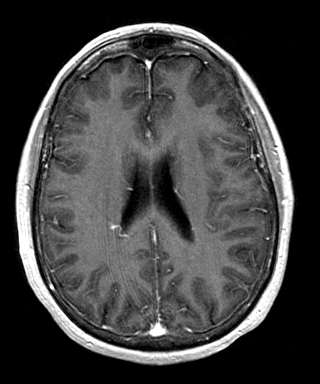
[im 71/80]
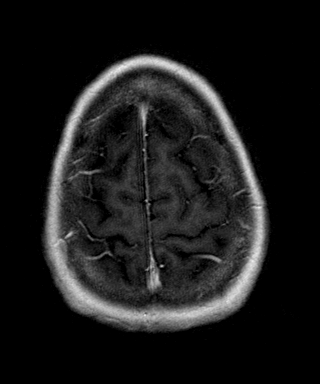
[im 80/80]
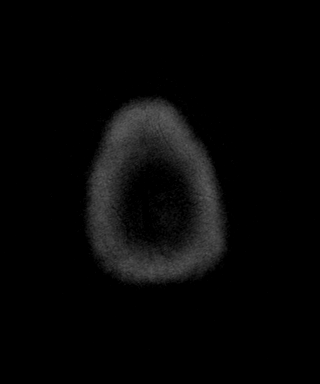

[Series 12: T1 post-contrast · coronal · 5.0mm · 0.42mm/px · 3 of 24 slices shown (2 of 3)]
[im 1/24]
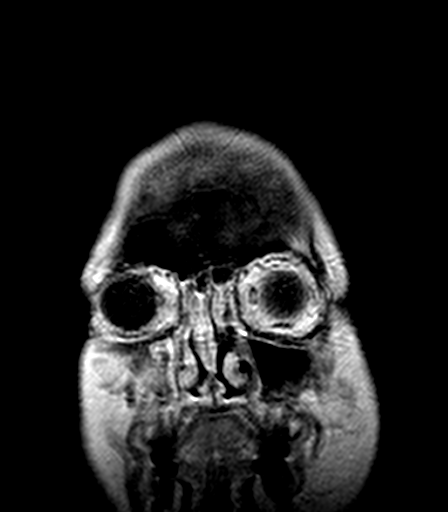
[im 12/24]
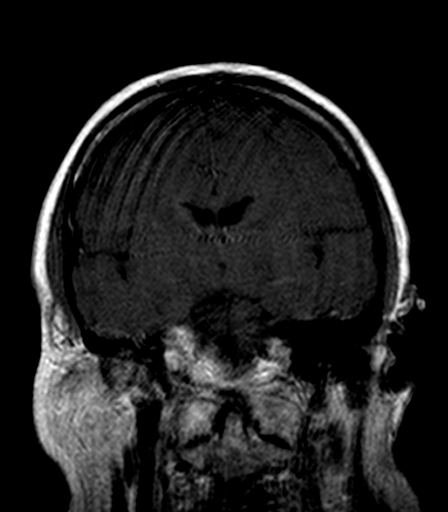
[im 24/24]
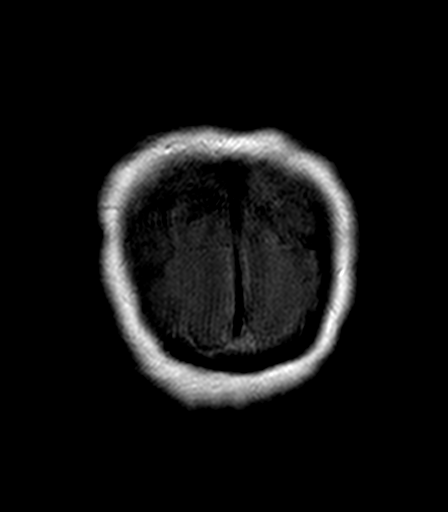

[Series 13: T1 post-contrast · sagittal · 5.0mm · 0.35mm/px · 2 of 17 slices shown (3 of 3)]
[im 1/17]
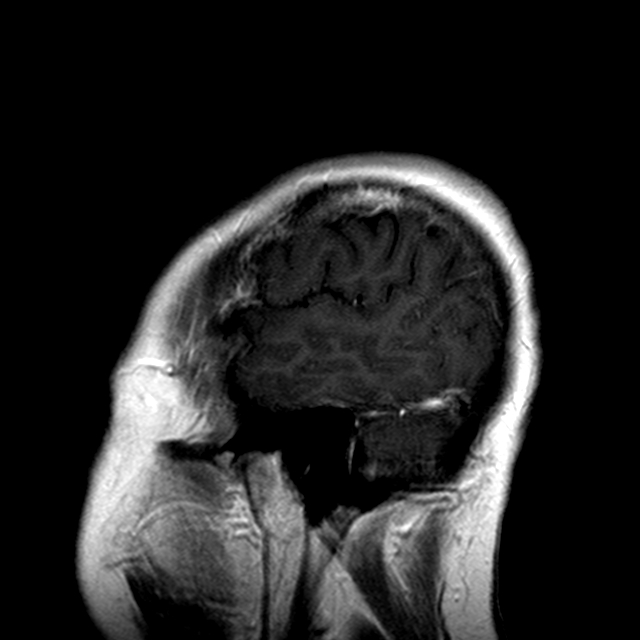
[im 17/17]
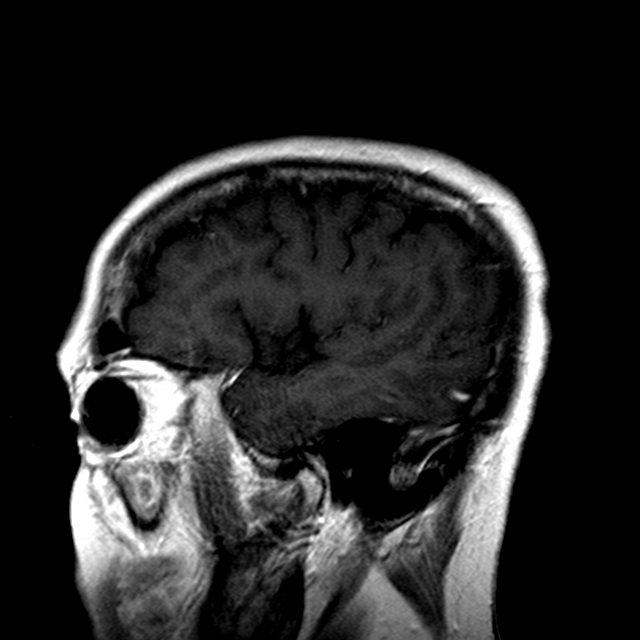

[31 of 48 positions shown; findings below may reference images not displayed]

FINDINGS: Motion degraded exam..

No acute infarct.

No intracranial hemorrhage.

No intracranial mass.

Small developmental venous anomaly spans between the right
opercular region and the posterior right ventricle.

Moderate paranasal sinus mucosal thickening/partial opacification
most notable right maxillary sinus.  No clear intraorbital or
intracranial extension.

Major intracranial vascular structures are patent.

Partially empty sella unchanged.

Exophthalmos.  This is without change.
IMPRESSION: Motion degraded exam..

No acute infarct.

No intracranial mass.

Small developmental venous anomaly spans between the right
opercular region and the posterior right ventricle.

Moderate paranasal sinus mucosal thickening/partial opacification
most notable right maxillary sinus.  No clear intraorbital or
intracranial extension.

## 2013-03-11 NOTE — Progress Notes (Signed)
Leonides Grills, MD [address] [place] [address]  NON-HODGKIN'S LYMPHOMA - Plan: CBC with Differential, Comprehensive metabolic panel, Lactate dehydrogenase, Beta 2 microglobuline, serum, Sedimentation rate, CBC with Differential, Comprehensive metabolic panel, Lactate dehydrogenase, Sedimentation rate, Beta 2 microglobuline, serum  HYPOGAMMAGLOBULINEMIA - Plan: IgG, IgA, IgM  CURRENT THERAPY: IVIG 40 g every 4 weeks.  Surveillance per NCCN guidelines for NHL.  INTERVAL HISTORY: [name] 57 y.o. female returns for  regular  visit for followup of Diffuse large B-cell lymphoma, no evidence of disease. Stage IV B. status post R. CHOP x4 cycles (08/14/2009- 10/23/2009) along with intrathecal chemotherapy during cycles 3 and 4 with methotrexate, 12 mg and SoluCortef 50 mg. Her chemotherapy had to be stopped due to severe Pseudomonas sepsis after cycles 2 and cycles 4. These episodes were life-threatening.  AND IgG deficiency, 40 g of IVIG ever 4 weeks to prevent recurrent infections which she has a strong history of.  I personally reviewed and went over laboratory results with the patient.  The results are noted within this dictation.  On 02/12/13, [name] received her IVIG dose.  Prior to the infusion, her IgG level was 677 and rise to 1720 following the infusion.  With her next IVIG infusion, we will ascertain IgG, IgM, and IgA levels prior to her IVIG infusion. The goal of therapy is to normalize her IgG levels.  If her levels remain low, it may be a strong indication for an increase in IVIG dose.  She does note a three-day history of fevers with a maximum temperature of 101.44F. She reports that she had a on and off fevers for 3 days. It resolved spontaneously. During this time, she noted some right sided mandibular edema with right maxillary sinus congestion. She reports that it has resolved without intervention. She did not contact our clinic during this  time. I suspect she had a right-sided sinusitis with reactive lymphadenopathy. She did not take any antibiotics for this she reports.  NCCN guidelines recommends the follow surveillance for Stage III-IV NHL for those who attain a complete response to therapy:  A. H&P every 3-6 months for 5 years, then yearly or as clinically indicated.  B. Labs every 3-6 months for 5 years and then annually or as clinically indicated.  C. CT imaging no more than every 6 months for the first two years after completion of therapy and then only as clinically indicated.   She denies any B. symptoms including fevers (other than a three-day history of fevers mentioned above), chills (other than the three-day history of fevers with chills mentioned above), night sweats, unintentional weight loss, and early satiety.  From a hematologic standpoint, the patient denies any complaints and ROS questioning is negative.  Past Medical History  Diagnosis Date  . Non Hodgkin's lymphoma   . Allergic rhinitis   . GERD (gastroesophageal reflux disease)   . Asthma   . Chronic sinusitis   . Respiratory failure   . Cavitary lung disease   . PONV (postoperative nausea and vomiting)   . Endotracheally intubated   . Myocardial infarction 2011  . Anxiety   . Chronic kidney disease   . Anemia   . 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  is allergic to meperidine hcl and montelukast sodium.  Ms. Bartz does not currently have medications on file.  Past Surgical History  Procedure Laterality Date  . Vesicovaginal fistula closure w/ tah    . Nasal sinus surgery    . Neck surgery      x 2   . Portacath placement  7/11     Removed 6/12  . Basal cell carcinoma excision  03/2011    scalp  . Port-a-cath removal    . Peripherally inserted central catheter insertion    . Picc removal    . Tracheostomy    . Abdominal hysterectomy    . Lung lobectomy      right  . Breast surgery Right   . Colonoscopy N/A 11/14/2012    Procedure: COLONOSCOPY;  Surgeon: Rogene Houston, MD;  Location: [facility];  Service: Endoscopy;  Laterality: N/A;  830    Denies any headaches, dizziness, double vision, fevers, chills, night sweats, nausea, vomiting, diarrhea, constipation, chest pain, heart palpitations, shortness of breath, blood in stool, black tarry stool, urinary pain, urinary burning, urinary frequency, hematuria.   PHYSICAL EXAMINATION  ECOG PERFORMANCE STATUS: 1 - Symptomatic but completely ambulatory  There were no vitals filed for this visit.  GENERAL:alert, no distress, well nourished, well developed, comfortable, cooperative and smiling SKIN: skin color, texture, turgor are normal, no rashes or significant lesions HEAD: Normocephalic, No masses, lesions, tenderness or abnormalities EYES: normal, PERRLA, EOMI, Conjunctiva are pink and non-injected EARS: External ears normal OROPHARYNX:lips, buccal mucosa, and tongue normal and mucous membranes are moist  NECK: supple, no adenopathy, thyroid normal size, non-tender, without nodularity, no stridor, non-tender, trachea midline LYMPH:  no palpable lymphadenopathy BREAST:not examined LUNGS: clear to auscultation  HEART: regular rate & rhythm, no murmurs and no gallops ABDOMEN:non-tender and normal bowel sounds BACK: Back symmetric, no curvature. EXTREMITIES:less then 2 second capillary refill, no joint deformities, effusion, or inflammation, no skin discoloration, no clubbing, no cyanosis  NEURO: alert & oriented x 3 with fluent speech, no focal motor/sensory deficits, gait normal    LABORATORY DATA:  Results for [name], [name] (MRN [id number]) as of  03/11/2013 20:36  Ref. Range 02/12/2013 08:15 02/12/2013 11:00  IgG (Immunoglobin G), Serum Latest Range: 321-130-5944 mg/dL 677 (L) 1720 (H)  IgA Latest Range: 69-380 mg/dL 76 70  IgM, Serum Latest Range: 52-322 mg/dL 78 74    CBC    Component Value Date/Time   WBC 10.6* 03/12/2013 0950   RBC 3.64* 03/12/2013 0950   RBC 4.20 02/12/2013 0815   HGB 11.4* 03/12/2013 0950   HCT 34.6* 03/12/2013 0950   PLT 204 03/12/2013 0950   MCV 95.1 03/12/2013 0950   MCH 31.3 03/12/2013 0950   MCHC 32.9 03/12/2013 0950   RDW 15.3 03/12/2013 0950   LYMPHSABS 1.0 03/12/2013 0950   MONOABS 0.6 03/12/2013 0950   EOSABS 0.1 03/12/2013 0950   BASOSABS 0.0 03/12/2013 0950      Chemistry      Component Value Date/Time   NA 141 03/12/2013 0950   K 4.0 03/12/2013 0950   CL 101 03/12/2013 0950   CO2 27 03/12/2013 0950   BUN 10 03/12/2013 0950   CREATININE 0.92 03/12/2013 0950      Component Value Date/Time   CALCIUM 9.1 03/12/2013 0950   ALKPHOS 107 03/12/2013 0950   AST 15 03/12/2013 0950   ALT 14 03/12/2013 0950   BILITOT 0.2* 03/12/2013 0950      Results for Starlin, Vaunda  Jacinto Reap (MRN [id number]) as of 03/11/2013 20:36  Ref. Range 01/15/2013 09:13  Beta-2 Microglobulin Latest Range: 1.01-1.73 mg/L 1.98 (H)   Results for JAQUASHA, CARNEVALE (MRN [id number]) as of 03/11/2013 20:36  Ref. Range 01/15/2013 09:13  LDH Latest Range: 94-250 U/L 190      ASSESSMENT:  1. Diffuse large B-cell lymphoma, no evidence of disease. Stage IV B. status post R. CHOP x4 cycles (08/14/2009- 10/23/2009) along with intrathecal chemotherapy during cycles 3 and 4 with methotrexate, 12 mg and SoluCortef 50 mg. Her chemotherapy had to be stopped due to severe Pseudomonas sepsis after cycles 2 and cycles 4. These episodes were life-threatening.  2. IgG deficiency, 40 g of IVIG ever 4 weeks to prevent recurrent infections which she has a strong history of. 3. Peripheral neuropathy, chemotherapy-induced.  4. Recurrent sinus infections.  5. Allergic rhinitis and  asthma, on treatment.   Patient Active Problem List   Diagnosis Date Noted  . OSA (obstructive sleep apnea) 09/10/2012  . 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 HYPOGAMMAGLOBULINEMIA 12/30/2009  . NON-HODGKIN'S LYMPHOMA 09/09/2009  . HYPERLIPIDEMIA 04/21/2007  . HYPERTENSION 04/21/2007  . DIABETES MELLITUS, BORDERLINE 04/21/2007  . Severe persistent asthma with chronic sinusitis 04/03/2007  . ALLERGIC RHINITIS 10/19/2006  . VOCAL CORD DISORDER 10/19/2006  . GERD 10/19/2006  . GASTRIC POLYP 07/25/2006     PLAN:  1. I personally reviewed and went over laboratory results with the patient.  The results are noted within this dictation. 2. Labs today: CBC diff, CMET, LDH, ESR, B2M 3. IVIG today 4. IVIG every 4 weeks, 40 grams 5. IgG, IgA, IgM prior to next IVIG infusion in 4 weeks. 6. Labs in 3 months: CBC diff, CMET, LDH, ESR, B2M 7. Return in 3 months   THERAPY PLAN:  We will continue with IVIG every 4 weeks for infection prophylaxis.  Additionally, we will follow NCCN guidelines for surveillance of NHL.  NCCN guidelines recommends the follow surveillance for Stage III-IV NHL for those who attain a complete response to therapy:  A. H&P every 3-6 months for 5 years, then yearly or as clinically indicated.  B. Labs every 3-6 months for 5 years and then annually or as clinically indicated.  C. CT imaging no more than every 6 months for the first two years after completion of therapy and then only as clinically indicated.    All questions were answered. The patient knows to call the clinic with any problems, questions or concerns. We can certainly see the patient much sooner if necessary.  Patient and plan discussed with Dr. Farrel Gobble and he is in agreement with the aforementioned.   Cyler Kappes

## 2013-03-12 ENCOUNTER — Encounter (HOSPITAL_COMMUNITY): Payer: Medicare Other | Attending: Hematology and Oncology | Admitting: Oncology

## 2013-03-12 ENCOUNTER — Encounter (HOSPITAL_BASED_OUTPATIENT_CLINIC_OR_DEPARTMENT_OTHER): Payer: Medicare Other

## 2013-03-12 VITALS — BP 128/54 | HR 54 | Temp 97.5°F | Resp 20 | Wt 175.4 lb

## 2013-03-12 DIAGNOSIS — D801 Nonfamilial hypogammaglobulinemia: Secondary | ICD-10-CM | POA: Diagnosis present

## 2013-03-12 DIAGNOSIS — C8589 Other specified types of non-Hodgkin lymphoma, extranodal and solid organ sites: Secondary | ICD-10-CM

## 2013-03-12 DIAGNOSIS — D809 Immunodeficiency with predominantly antibody defects, unspecified: Secondary | ICD-10-CM

## 2013-03-12 DIAGNOSIS — N189 Chronic kidney disease, unspecified: Secondary | ICD-10-CM

## 2013-03-12 DIAGNOSIS — J329 Chronic sinusitis, unspecified: Secondary | ICD-10-CM

## 2013-03-12 DIAGNOSIS — E119 Type 2 diabetes mellitus without complications: Secondary | ICD-10-CM

## 2013-03-12 LAB — CBC WITH DIFFERENTIAL/PLATELET
Basophils Absolute: 0 10*3/uL (ref 0.0–0.1)
Basophils Relative: 0 % (ref 0–1)
EOS ABS: 0.1 10*3/uL (ref 0.0–0.7)
Eosinophils Relative: 1 % (ref 0–5)
HCT: 34.6 % — ABNORMAL LOW (ref 36.0–46.0)
Hemoglobin: 11.4 g/dL — ABNORMAL LOW (ref 12.0–15.0)
LYMPHS ABS: 1 10*3/uL (ref 0.7–4.0)
LYMPHS PCT: 9 % — AB (ref 12–46)
MCH: 31.3 pg (ref 26.0–34.0)
MCHC: 32.9 g/dL (ref 30.0–36.0)
MCV: 95.1 fL (ref 78.0–100.0)
Monocytes Absolute: 0.6 10*3/uL (ref 0.1–1.0)
Monocytes Relative: 6 % (ref 3–12)
NEUTROS PCT: 84 % — AB (ref 43–77)
Neutro Abs: 8.9 10*3/uL — ABNORMAL HIGH (ref 1.7–7.7)
PLATELETS: 204 10*3/uL (ref 150–400)
RBC: 3.64 MIL/uL — ABNORMAL LOW (ref 3.87–5.11)
RDW: 15.3 % (ref 11.5–15.5)
WBC: 10.6 10*3/uL — AB (ref 4.0–10.5)

## 2013-03-12 LAB — COMPREHENSIVE METABOLIC PANEL
ALK PHOS: 107 U/L (ref 39–117)
ALT: 14 U/L (ref 0–35)
AST: 15 U/L (ref 0–37)
Albumin: 3.3 g/dL — ABNORMAL LOW (ref 3.5–5.2)
BUN: 10 mg/dL (ref 6–23)
CO2: 27 mEq/L (ref 19–32)
Calcium: 9.1 mg/dL (ref 8.4–10.5)
Chloride: 101 mEq/L (ref 96–112)
Creatinine, Ser: 0.92 mg/dL (ref 0.50–1.10)
GFR calc non Af Amer: 67 mL/min — ABNORMAL LOW (ref >90)
GFR, EST AFRICAN AMERICAN: 78 mL/min — AB (ref >90)
GLUCOSE: 159 mg/dL — AB (ref 70–99)
Potassium: 4 mEq/L (ref 3.7–5.3)
Sodium: 141 mEq/L (ref 137–147)
Total Bilirubin: 0.2 mg/dL — ABNORMAL LOW (ref 0.3–1.2)
Total Protein: 6.9 g/dL (ref 6.0–8.3)

## 2013-03-12 LAB — LACTATE DEHYDROGENASE: LDH: 189 U/L (ref 94–250)

## 2013-03-12 LAB — SEDIMENTATION RATE: Sed Rate: 60 mm/hr — ABNORMAL HIGH (ref 0–22)

## 2013-03-12 MED ORDER — SODIUM CHLORIDE 0.9 % IV SOLN
Freq: Once | INTRAVENOUS | Status: AC
  Administered 2013-03-12: 10:00:00 via INTRAVENOUS

## 2013-03-12 MED ORDER — IMMUNE GLOBULIN (HUMAN) 20 GM/200ML IV SOLN
40.0000 g | Freq: Once | INTRAVENOUS | Status: AC
  Administered 2013-03-12: 40 g via INTRAVENOUS
  Filled 2013-03-12: qty 400

## 2013-03-12 MED ORDER — ACETAMINOPHEN 325 MG PO TABS: 650.0000 mg | ORAL_TABLET | Freq: Four times a day (QID) | ORAL | Status: DC | PRN

## 2013-03-12 NOTE — Patient Instructions (Signed)
[facility] Discharge Instructions  RECOMMENDATIONS MADE BY THE CONSULTANT AND ANY TEST RESULTS WILL BE SENT TO YOUR REFERRING PHYSICIAN.  We will continue with current plans for IVIG every 4 weeks. We will do a blood test before your next infusion to test your immunoglobulin levels. MD appointment again in 3 months. Report any issues/concerns to clinic as needed prior to appointments.   Thank you for choosing [facility] to provide your oncology and hematology care.  To afford each patient quality time with our providers, please arrive at least 15 minutes before your scheduled appointment time.  With your help, our goal is to use those 15 minutes to complete the necessary work-up to ensure our physicians have the information they need to help with your evaluation and healthcare recommendations.    Effective January 1st, 2014, we ask that you re-schedule your appointment with our physicians should you arrive 10 or more minutes late for your appointment.  We strive to give you quality time with our providers, and arriving late affects you and other patients whose appointments are after yours.    Again, thank you for choosing [facility].  Our hope is that these requests will decrease the amount of time that you wait before being seen by our physicians.       _____________________________________________________________  Should you have questions after your visit to [facility], please contact our office at [phone] between the hours of 8:30 a.m. and 5:00 p.m.  Voicemails left after 4:30 p.m. will not be returned until the following business day.  For prescription refill requests, have your pharmacy contact our office with your prescription refill request.

## 2013-03-12 NOTE — Progress Notes (Signed)
Pt reports she took tylenol at home prior to appointment today.

## 2013-03-15 LAB — BETA 2 MICROGLOBULIN, SERUM: Beta-2 Microglobulin: 2.44 mg/L — ABNORMAL HIGH (ref <=2.51)

## 2013-03-25 IMAGING — CR DG CHEST 2V
2 series · 2 of 2 positions shown · non-contrast
Comparison: Chest x-ray 04/28/2011.

CLINICAL DATA: Fever and shortness of breath.  History of non-
Hodgkins lymphoma.

CHEST - 2 VIEW

[view not recorded (1 of 2)]
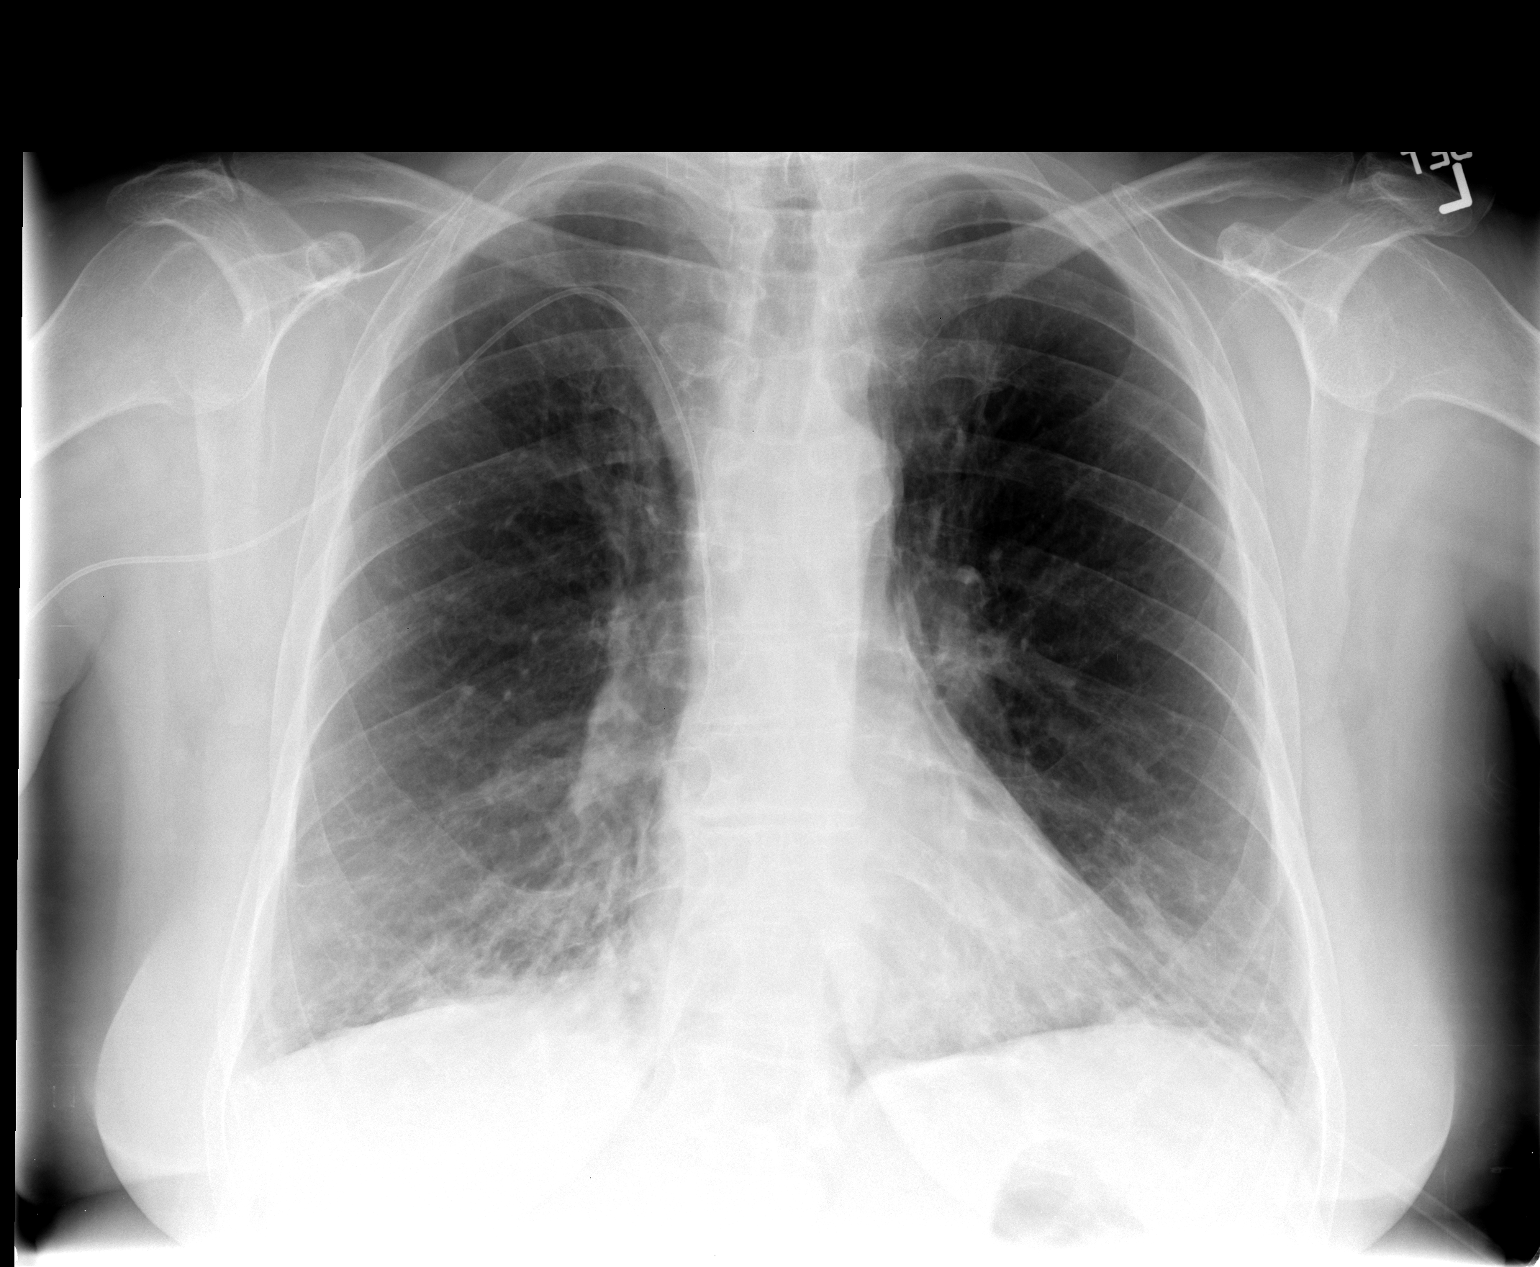

[view not recorded (2 of 2)]
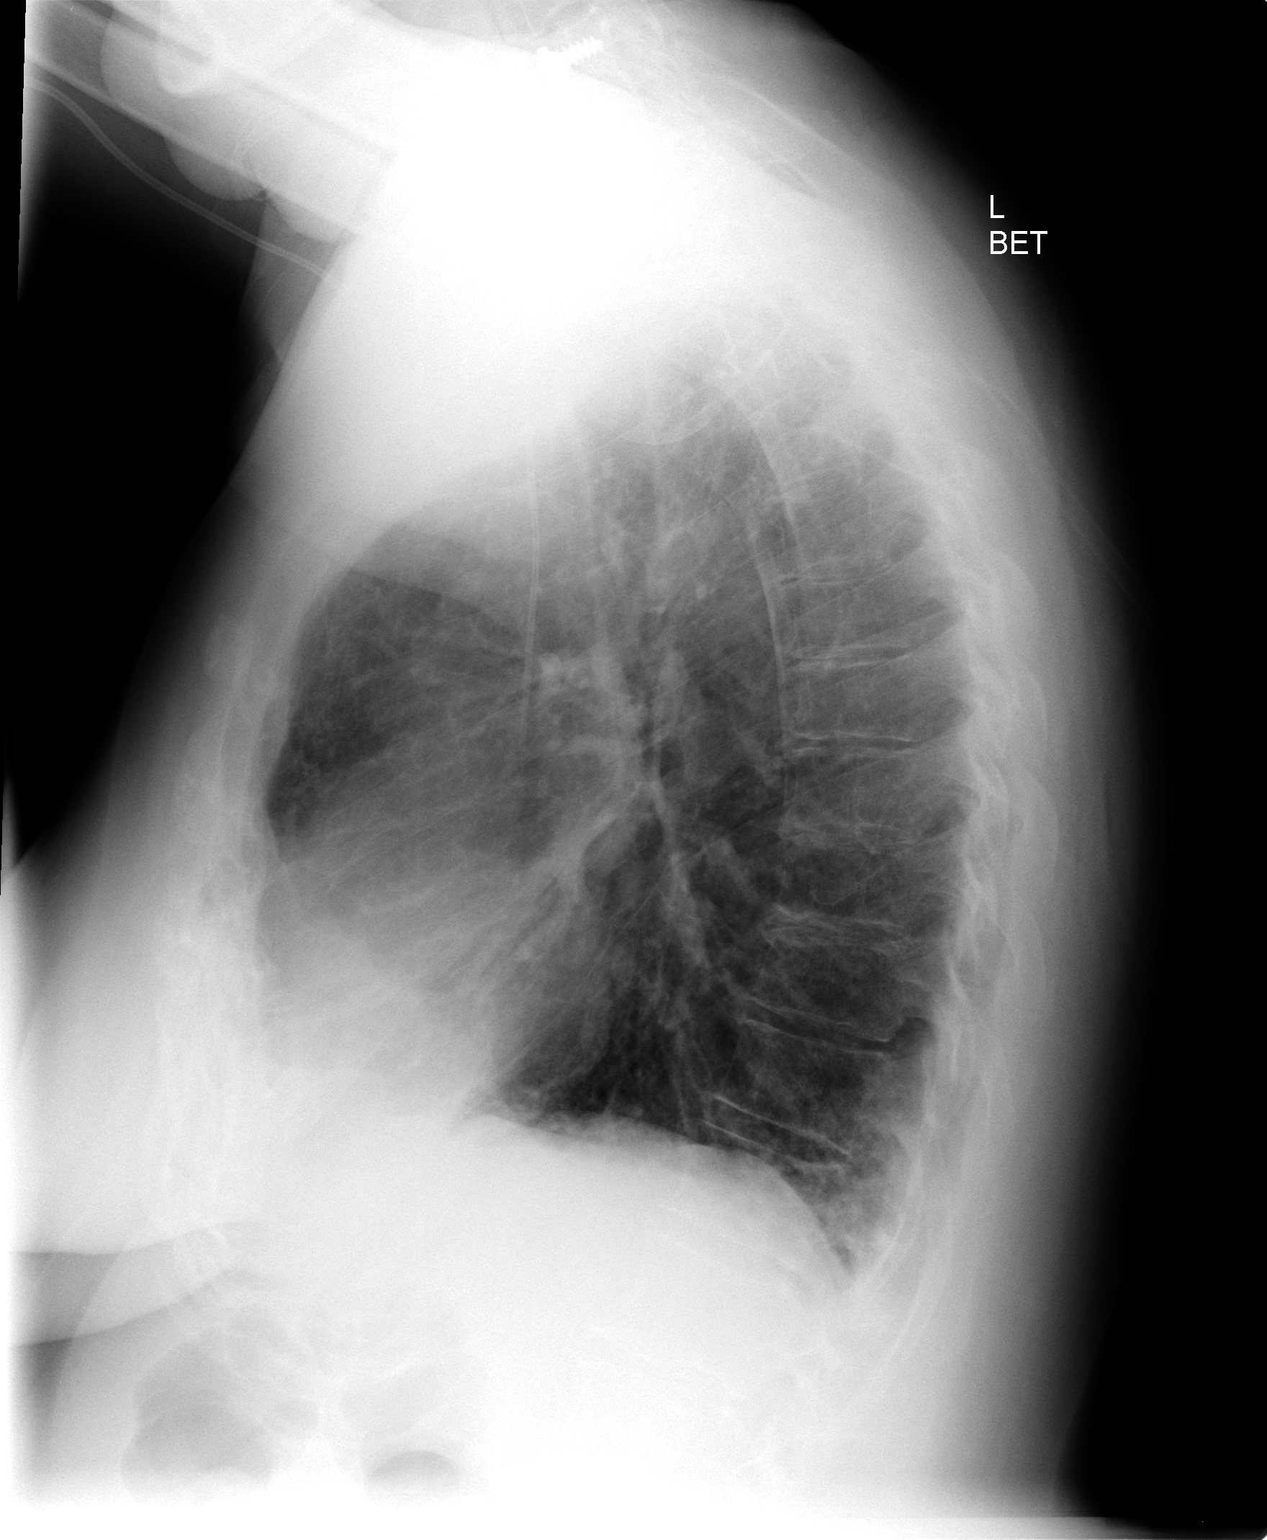

[2 of 2 positions shown; findings below may reference images not displayed]

FINDINGS: Prominent interstitial markings in the lung bases
bilaterally likely reflect chronic bronchial wall thickening and
mild cylindrical bronchiectasis (better demonstrated on prior CT
scan 06/07/2010).  No definite acute consolidative airspace
disease.  No pleural effusions.  Pulmonary vasculature and the
cardiomediastinal silhouette are within normal limits.
Atherosclerosis of the thoracic aorta. There is a right upper
extremity PICC with tip terminating in the distal superior vena
cava. Orthopedic fixation hardware in the lower cervical spine.
IMPRESSION: 1.  No definite radiographic evidence of acute cardiopulmonary
disease.
2.  Chronic changes in the lung bases (bronchial wall thickening
and mild cylindrical bronchiectasis), similar to prior
examinations.
3.  Atherosclerosis.
4.  Tip of right upper extremity PICC is in the distal superior
vena cava.

## 2013-04-03 ENCOUNTER — Telehealth: Payer: Self-pay | Admitting: *Deleted

## 2013-04-03 NOTE — Telephone Encounter (Signed)
We received a PA request from pharmacy for omeprazole 20mg  twice daily. This medication is requiring a Drug Quantity Limitation request. Form has been completed and signed by PW and faxed to [facility] at [phone]. The PA # is [phone]. Pt ID# [id number]. Form faxed. I will forward message to Crystal to follow-up on approval/denial. [place], CMA

## 2013-04-04 NOTE — Progress Notes (Signed)
QI review

## 2013-04-04 NOTE — Addendum Note (Signed)
Addended by: Kurtis Bushman A on: 04/04/2013 03:55 PM   Modules accepted: Medications

## 2013-04-09 ENCOUNTER — Encounter (HOSPITAL_COMMUNITY): Payer: Medicare Other | Attending: Hematology and Oncology

## 2013-04-09 ENCOUNTER — Encounter (HOSPITAL_BASED_OUTPATIENT_CLINIC_OR_DEPARTMENT_OTHER): Payer: Medicare Other

## 2013-04-09 ENCOUNTER — Other Ambulatory Visit (HOSPITAL_COMMUNITY): Payer: Medicare Other

## 2013-04-09 VITALS — BP 135/75 | HR 65 | Temp 97.8°F | Resp 18 | Wt 177.4 lb

## 2013-04-09 DIAGNOSIS — D809 Immunodeficiency with predominantly antibody defects, unspecified: Secondary | ICD-10-CM

## 2013-04-09 DIAGNOSIS — D801 Nonfamilial hypogammaglobulinemia: Secondary | ICD-10-CM

## 2013-04-09 MED ORDER — ACETAMINOPHEN 325 MG PO TABS: 650.0000 mg | ORAL_TABLET | Freq: Four times a day (QID) | ORAL | Status: DC | PRN

## 2013-04-09 MED ORDER — IMMUNE GLOBULIN (HUMAN) 20 GM/200ML IV SOLN
40.0000 g | Freq: Once | INTRAVENOUS | Status: AC
  Administered 2013-04-09: 40 g via INTRAVENOUS
  Filled 2013-04-09: qty 400

## 2013-04-09 MED ORDER — IMMUNE GLOBULIN (HUMAN) 1 GM/10ML IV SOLN: 40.0000 mg | Freq: Once | INTRAVENOUS | Status: DC

## 2013-04-09 NOTE — Progress Notes (Signed)
Labs drawn today for IGG,iga,igm

## 2013-04-10 LAB — IGG, IGA, IGM
IGA: 77 mg/dL (ref 69–380)
IgG (Immunoglobin G), Serum: 744 mg/dL (ref 690–1700)
IgM, Serum: 86 mg/dL (ref 52–322)

## 2013-04-11 ENCOUNTER — Telehealth (HOSPITAL_COMMUNITY): Payer: Self-pay | Admitting: *Deleted

## 2013-04-11 ENCOUNTER — Telehealth (HOSPITAL_COMMUNITY): Payer: Self-pay | Admitting: Hematology and Oncology

## 2013-04-11 MED ORDER — AZITHROMYCIN 250 MG PO TABS: ORAL_TABLET | ORAL | Status: DC

## 2013-04-11 NOTE — Telephone Encounter (Signed)
See telephone note.

## 2013-04-11 NOTE — Telephone Encounter (Signed)
Fever last night was 100.4 and chills. This am temperature is 99.4. No weird smell in nose. Last received IVIG on 04/09/13. Tom told pt to call anytime she had a fever. Any suggestions?

## 2013-04-15 NOTE — Telephone Encounter (Signed)
Spoke with [facility] with [employer] as we have not received anything back regarding this yet. Per Vaughan Basta, the omeprazole 20 mg bid has now been APPROVED through April 04, 2014 for a Quantity Limitation. Per Vaughan Basta, nothing further is needed. Pt is aware of approva.

## 2013-04-21 ENCOUNTER — Emergency Department (HOSPITAL_COMMUNITY)
Admission: EM | Admit: 2013-04-21 | Discharge: 2013-04-21 | Disposition: A | Discharge status: Home / Self Care | Payer: Medicare Other | Attending: Emergency Medicine | Admitting: Emergency Medicine

## 2013-04-21 ENCOUNTER — Encounter (HOSPITAL_COMMUNITY): Payer: Self-pay | Admitting: Emergency Medicine

## 2013-04-21 DIAGNOSIS — S91009A Unspecified open wound, unspecified ankle, initial encounter: Principal | ICD-10-CM

## 2013-04-21 DIAGNOSIS — J45909 Unspecified asthma, uncomplicated: Secondary | ICD-10-CM | POA: Insufficient documentation

## 2013-04-21 DIAGNOSIS — Z79899 Other long term (current) drug therapy: Secondary | ICD-10-CM | POA: Insufficient documentation

## 2013-04-21 DIAGNOSIS — K219 Gastro-esophageal reflux disease without esophagitis: Secondary | ICD-10-CM | POA: Insufficient documentation

## 2013-04-21 DIAGNOSIS — W268XXA Contact with other sharp object(s), not elsewhere classified, initial encounter: Secondary | ICD-10-CM | POA: Insufficient documentation

## 2013-04-21 DIAGNOSIS — S81811A Laceration without foreign body, right lower leg, initial encounter: Secondary | ICD-10-CM

## 2013-04-21 DIAGNOSIS — Z87898 Personal history of other specified conditions: Secondary | ICD-10-CM | POA: Insufficient documentation

## 2013-04-21 DIAGNOSIS — Y929 Unspecified place or not applicable: Secondary | ICD-10-CM | POA: Insufficient documentation

## 2013-04-21 DIAGNOSIS — IMO0002 Reserved for concepts with insufficient information to code with codable children: Secondary | ICD-10-CM | POA: Insufficient documentation

## 2013-04-21 DIAGNOSIS — Z7901 Long term (current) use of anticoagulants: Secondary | ICD-10-CM | POA: Insufficient documentation

## 2013-04-21 DIAGNOSIS — Z8701 Personal history of pneumonia (recurrent): Secondary | ICD-10-CM | POA: Insufficient documentation

## 2013-04-21 DIAGNOSIS — I252 Old myocardial infarction: Secondary | ICD-10-CM | POA: Insufficient documentation

## 2013-04-21 DIAGNOSIS — Z23 Encounter for immunization: Secondary | ICD-10-CM | POA: Insufficient documentation

## 2013-04-21 DIAGNOSIS — N189 Chronic kidney disease, unspecified: Secondary | ICD-10-CM | POA: Insufficient documentation

## 2013-04-21 DIAGNOSIS — Y9389 Activity, other specified: Secondary | ICD-10-CM | POA: Insufficient documentation

## 2013-04-21 DIAGNOSIS — S81809A Unspecified open wound, unspecified lower leg, initial encounter: Principal | ICD-10-CM

## 2013-04-21 DIAGNOSIS — F411 Generalized anxiety disorder: Secondary | ICD-10-CM | POA: Insufficient documentation

## 2013-04-21 DIAGNOSIS — S81009A Unspecified open wound, unspecified knee, initial encounter: Secondary | ICD-10-CM | POA: Insufficient documentation

## 2013-04-21 DIAGNOSIS — Z862 Personal history of diseases of the blood and blood-forming organs and certain disorders involving the immune mechanism: Secondary | ICD-10-CM | POA: Insufficient documentation

## 2013-04-21 MED ORDER — LIDOCAINE HCL (PF) 2 % IJ SOLN
INTRAMUSCULAR | Status: AC
  Filled 2013-04-21: qty 10

## 2013-04-21 MED ORDER — TETANUS-DIPHTH-ACELL PERTUSSIS 5-2.5-18.5 LF-MCG/0.5 IM SUSP
INTRAMUSCULAR | Status: AC
  Filled 2013-04-21: qty 0.5

## 2013-04-21 MED ORDER — TETANUS-DIPHTH-ACELL PERTUSSIS 5-2.5-18.5 LF-MCG/0.5 IM SUSP
0.5000 mL | Freq: Once | INTRAMUSCULAR | Status: AC
  Administered 2013-04-21: 0.5 mL via INTRAMUSCULAR

## 2013-04-21 MED ORDER — LIDOCAINE HCL (PF) 1 % IJ SOLN
5.0000 mL | Freq: Once | INTRAMUSCULAR | Status: AC
  Administered 2013-04-21: 5 mL via INTRADERMAL
  Filled 2013-04-21: qty 5

## 2013-04-21 NOTE — ED Provider Notes (Signed)
CSN: [id number]     Arrival date & time 04/21/13  1811 History   First MD Initiated Contact with Patient 04/21/13 1824     Chief Complaint  Patient presents with  . Laceration     (Consider location/radiation/quality/duration/timing/severity/associated sxs/prior Treatment) Patient is a 58 y.o. female presenting with skin laceration. The history is provided by the patient.  Laceration Location:  Leg Leg laceration location:  R lower leg Length (cm):  4  Depth:  Through dermis Quality comment:  Flap Bleeding: controlled with pressure   Time since incident:  2 hours Laceration mechanism:  Blunt object (patient reports cutting her leg on a sharp edege of a piece of cardboard) Pain details:    Quality:  Aching   Severity:  Mild   Timing:  Constant   Progression:  Unchanged Foreign body present:  No foreign bodies Relieved by:  Pressure Worsened by:  Movement Ineffective treatments:  None tried Tetanus status:  Unknown  Patient reports that she takes coumadin daily for hx of previous PE/DVT and states that bleeding was controlled after applying pressure.  She denies numbness weakness of the extremity, pain with movement or other injuries.     Past Medical History  Diagnosis Date  . Non Hodgkin's lymphoma   . Allergic rhinitis   . GERD (gastroesophageal reflux disease)   . Asthma   . Chronic sinusitis   . Respiratory failure   . Cavitary lung disease   . PONV (postoperative nausea and vomiting)   . Endotracheally intubated   . Myocardial infarction 2011  . Anxiety   . Chronic kidney disease   . Anemia   . Pneumonia 01/2013   Past Surgical History  Procedure Laterality Date  . Vesicovaginal fistula closure w/ tah    . Nasal sinus surgery    . Neck surgery      x 2   . Portacath placement  7/11    Removed 6/12  . Basal cell carcinoma excision  03/2011    scalp  . Port-a-cath removal    . Peripherally inserted central catheter insertion    . Picc removal    .  Tracheostomy    . Abdominal hysterectomy    . Lung lobectomy      right  . Breast surgery Right   . Colonoscopy N/A 11/14/2012    Procedure: COLONOSCOPY;  Surgeon: Rogene Houston, MD;  Location: [facility];  Service: Endoscopy;  Laterality: N/A;  83   Family History  Problem Relation Age of Onset  . Emphysema Mother   . Allergies Father   . Asthma Father     as a child  . Leukemia Maternal Grandmother   . Diabetes Brother   . Stroke Mother    History  Substance Use Topics  . Smoking status: Never Smoker   . Smokeless tobacco: Never Used  . Alcohol Use: No   OB History   Grav Para Term Preterm Abortions TAB SAB Ect Mult Living                 Review of Systems  Constitutional: Negative for fever and chills.  Musculoskeletal: Negative for arthralgias, back pain and joint swelling.  Skin: Positive for wound.       Laceration   Neurological: Negative for dizziness, weakness, light-headedness and numbness.  Hematological: Bruises/bleeds easily.  All other systems reviewed and are negative.     Allergies  Meperidine hcl and Montelukast sodium  Home Medications   Prior to Admission  medications   Medication Sig Start Date End Date Taking? Authorizing Provider  acetaminophen (TYLENOL) 500 MG tablet Take 1,000 mg by mouth every 6 (six) hours as needed for pain or fever.    Yes Historical Provider, MD  acyclovir (ZOVIRAX) 400 MG tablet Take 400 mg by mouth 2 (two) times daily.   Yes Historical Provider, MD  albuterol (PROVENTIL HFA;VENTOLIN HFA) 108 (90 BASE) MCG/ACT inhaler Inhale 2 puffs into the lungs every 6 (six) hours as needed for wheezing or shortness of breath.   Yes Historical Provider, MD  citalopram (CELEXA) 20 MG tablet Take 20 mg by mouth every morning.     Yes Historical Provider, MD  fluticasone (FLONASE) 50 MCG/ACT nasal spray Place 2 sprays into the nose 2 (two) times daily. 02/16/12  Yes Elsie Stain, MD  furosemide (LASIX) 20 MG tablet Take 1  tablet by mouth every other day as needed. *Usually takes as needed for fluid retention upon weighing in**   Yes Historical Provider, MD  gabapentin (NEURONTIN) 300 MG capsule Take 300 mg by mouth 4 (four) times daily.   Yes Historical Provider, MD  mometasone-formoterol (DULERA) 200-5 MCG/ACT AERO Inhale 2 puffs into the lungs 2 (two) times daily. 03/19/12  Yes Lister Brizzi S Parrett, NP  omeprazole (PRILOSEC) 20 MG capsule Take 1 capsule (20 mg total) by mouth 2 (two) times daily. 03/19/12  Yes Camala Talwar S Parrett, NP  predniSONE (DELTASONE) 10 MG tablet Take 0.5 tablets (5 mg total) by mouth daily. 01/07/13  Yes Elsie Stain, MD  simvastatin (ZOCOR) 80 MG tablet Take 80 mg by mouth at bedtime.     Yes Historical Provider, MD  vitamin C (ASCORBIC ACID) 500 MG tablet Take 500 mg by mouth 2 (two) times daily.   Yes Historical Provider, MD  warfarin (COUMADIN) 10 MG tablet Take 5 mg by mouth daily. Take 1/2 tablet (5mg ) daily 07/13/12  Yes Historical Provider, MD  zafirlukast (ACCOLATE) 20 MG tablet Take 1 tablet (20 mg total) by mouth 2 (two) times daily. 01/07/13  Yes Elsie Stain, MD  albuterol (PROVENTIL) (2.5 MG/3ML) 0.083% nebulizer solution Take 2.5 mg by nebulization every 4 (four) hours as needed for wheezing or shortness of breath.    Historical Provider, MD   BP 144/89  Pulse 84  Temp(Src) 98 F (36.7 C) (Oral)  Resp 20  Ht 5\' 2"  (1.575 m)  Wt 172 lb (78.019 kg)  BMI 31.45 kg/m2  SpO2 99% Physical Exam  Nursing note and vitals reviewed. Constitutional: She is oriented to person, place, and time. She appears well-developed and well-nourished. No distress.  HENT:  Head: Normocephalic and atraumatic.  Cardiovascular: Normal rate, regular rhythm, normal heart sounds and intact distal pulses.   No murmur heard. Pulmonary/Chest: Effort normal and breath sounds normal. No respiratory distress.  Musculoskeletal: She exhibits no edema.       Right lower leg: She exhibits tenderness and  laceration. She exhibits no bony tenderness, no swelling, no edema and no deformity.       Legs: 3.5 x 4 cm flap type laceration.  Bleeding is controlled, no edema.  Pt has full ROM of the affected extremity.  DP pulse brisk, distal sensation intact.  No obvious injury to the deep structures.    Neurological: She is alert and oriented to person, place, and time. She exhibits normal muscle tone. Coordination normal.  Skin: Skin is warm.  See MS exam    ED Course  Procedures (including critical care time)  Labs Review Labs Reviewed - No data to display  Imaging Review No results found.   EKG Interpretation None       LACERATION REPAIR Performed by: Natalija Mavis L. Durelle Zepeda Authorized by: Yaacov Koziol L. Trisha Ken Consent: Verbal consent obtained. Risks and benefits: risks, benefits and alternatives were discussed Consent given by: patient Patient identity confirmed: provided demographic data Prepped and Draped in normal sterile fashion Wound explored  Laceration Location: right lower leg Laceration Length: 3.5 x 4 cm  No Foreign Bodies seen or palpated  Anesthesia: local infiltration  Local anesthetic: lidocaine 1 % w/o epinephrine  Anesthetic total: 4  ml  Irrigation method: syringe Amount of cleaning: standard  Skin closure: 4-0 prolene Number of sutures: 13  Technique: simple interrupted  Patient tolerance: Patient tolerated the procedure well with no immediate complications.   MDM   Final diagnoses:  Laceration of lower leg, right    tdap updated.    Laceration edeges well approximated.  Bleeding controlled.  Patient reports recent INR check to be 2.3.  She is hemodynamically stable at present.  Dressing applied.  No concerning sx's for injury to deep structures.  Pt was advised to elevate her leg, minimal wt bearing and keep bandaged.  Given info for return if any signs of infection develop.  Pt agrees to care plan and verbalized understanding.  She appears stable for  discharge.      Macyn Remmert L. [place], PA-C 04/22/13 1359

## 2013-04-21 NOTE — ED Notes (Signed)
Patient reports cutting her leg on a cardboard filing folder. Large v-shaped laceration noted. No active bleeding noted.

## 2013-04-21 NOTE — Discharge Instructions (Signed)

## 2013-04-22 NOTE — ED Provider Notes (Signed)
Medical screening examination/treatment/procedure(s) were performed by non-physician practitioner and as supervising physician I was immediately available for consultation/collaboration.   EKG Interpretation None       Antionetta Ator M Lucan Riner, MD 04/22/13 2205 

## 2013-05-03 ENCOUNTER — Encounter (HOSPITAL_COMMUNITY): Payer: Self-pay | Admitting: Emergency Medicine

## 2013-05-03 ENCOUNTER — Emergency Department (HOSPITAL_COMMUNITY)
Admission: EM | Admit: 2013-05-03 | Discharge: 2013-05-03 | Disposition: A | Discharge status: Home / Self Care | Payer: Medicare Other | Attending: Emergency Medicine | Admitting: Emergency Medicine

## 2013-05-03 DIAGNOSIS — J45909 Unspecified asthma, uncomplicated: Secondary | ICD-10-CM | POA: Insufficient documentation

## 2013-05-03 DIAGNOSIS — K219 Gastro-esophageal reflux disease without esophagitis: Secondary | ICD-10-CM | POA: Insufficient documentation

## 2013-05-03 DIAGNOSIS — Z79899 Other long term (current) drug therapy: Secondary | ICD-10-CM | POA: Insufficient documentation

## 2013-05-03 DIAGNOSIS — N189 Chronic kidney disease, unspecified: Secondary | ICD-10-CM | POA: Insufficient documentation

## 2013-05-03 DIAGNOSIS — IMO0002 Reserved for concepts with insufficient information to code with codable children: Secondary | ICD-10-CM | POA: Insufficient documentation

## 2013-05-03 DIAGNOSIS — I252 Old myocardial infarction: Secondary | ICD-10-CM | POA: Insufficient documentation

## 2013-05-03 DIAGNOSIS — Z4801 Encounter for change or removal of surgical wound dressing: Secondary | ICD-10-CM | POA: Insufficient documentation

## 2013-05-03 DIAGNOSIS — Z8571 Personal history of Hodgkin lymphoma: Secondary | ICD-10-CM | POA: Insufficient documentation

## 2013-05-03 DIAGNOSIS — Z8701 Personal history of pneumonia (recurrent): Secondary | ICD-10-CM | POA: Insufficient documentation

## 2013-05-03 DIAGNOSIS — F411 Generalized anxiety disorder: Secondary | ICD-10-CM | POA: Insufficient documentation

## 2013-05-03 DIAGNOSIS — Z862 Personal history of diseases of the blood and blood-forming organs and certain disorders involving the immune mechanism: Secondary | ICD-10-CM | POA: Insufficient documentation

## 2013-05-03 DIAGNOSIS — Z7901 Long term (current) use of anticoagulants: Secondary | ICD-10-CM | POA: Insufficient documentation

## 2013-05-03 DIAGNOSIS — N289 Disorder of kidney and ureter, unspecified: Secondary | ICD-10-CM

## 2013-05-03 DIAGNOSIS — Z7902 Long term (current) use of antithrombotics/antiplatelets: Secondary | ICD-10-CM | POA: Insufficient documentation

## 2013-05-03 DIAGNOSIS — Z4889 Encounter for other specified surgical aftercare: Secondary | ICD-10-CM

## 2013-05-03 LAB — BASIC METABOLIC PANEL
BUN: 17 mg/dL (ref 6–23)
CALCIUM: 9.5 mg/dL (ref 8.4–10.5)
CO2: 26 mEq/L (ref 19–32)
Chloride: 99 mEq/L (ref 96–112)
Creatinine, Ser: 1.41 mg/dL — ABNORMAL HIGH (ref 0.50–1.10)
GFR, EST AFRICAN AMERICAN: 47 mL/min — AB (ref >90)
GFR, EST NON AFRICAN AMERICAN: 40 mL/min — AB (ref >90)
Glucose, Bld: 112 mg/dL — ABNORMAL HIGH (ref 70–99)
Potassium: 5 mEq/L (ref 3.7–5.3)
SODIUM: 138 meq/L (ref 137–147)

## 2013-05-03 LAB — CBC
HCT: 35.4 % — ABNORMAL LOW (ref 36.0–46.0)
Hemoglobin: 11.4 g/dL — ABNORMAL LOW (ref 12.0–15.0)
MCH: 29.2 pg (ref 26.0–34.0)
MCHC: 32.2 g/dL (ref 30.0–36.0)
MCV: 90.8 fL (ref 78.0–100.0)
PLATELETS: 188 10*3/uL (ref 150–400)
RBC: 3.9 MIL/uL (ref 3.87–5.11)
RDW: 15.2 % (ref 11.5–15.5)
WBC: 9.5 10*3/uL (ref 4.0–10.5)

## 2013-05-03 MED ORDER — CEPHALEXIN 500 MG PO CAPS: 500.0000 mg | ORAL_CAPSULE | Freq: Three times a day (TID) | ORAL | Status: DC

## 2013-05-03 NOTE — ED Notes (Signed)
She was sent from doctors office for possible infection of wound to R leg. She was in the office to have her sutures removed today, pt states "they were worried because of the redness and drainage and the wound is not closing up."

## 2013-05-03 NOTE — ED Provider Notes (Signed)
CSN: [id number]     Arrival date & time 05/03/13  1523 History   First MD Initiated Contact with Patient 05/03/13 1720     Chief Complaint  Patient presents with  . Wound Infection      HPI She was sent from doctors office for possible infection of wound to R leg. She was in the office to have her sutures removed today, pt states "they were worried because of the redness and drainage and the wound is not closing up."  Past Medical History  Diagnosis Date  . Non Hodgkin's lymphoma   . Allergic rhinitis   . GERD (gastroesophageal reflux disease)   . Asthma   . Chronic sinusitis   . Respiratory failure   . Cavitary lung disease   . PONV (postoperative nausea and vomiting)   . Endotracheally intubated   . Myocardial infarction 2011  . Anxiety   . Chronic kidney disease   . Anemia   . Pneumonia 01/2013   Past Surgical History  Procedure Laterality Date  . Vesicovaginal fistula closure w/ tah    . Nasal sinus surgery    . Neck surgery      x 2   . Portacath placement  7/11    Removed 6/12  . Basal cell carcinoma excision  03/2011    scalp  . Port-a-cath removal    . Peripherally inserted central catheter insertion    . Picc removal    . Tracheostomy    . Abdominal hysterectomy    . Lung lobectomy      right  . Breast surgery Right   . Colonoscopy N/A 11/14/2012    Procedure: COLONOSCOPY;  Surgeon: Rogene Houston, MD;  Location: [facility];  Service: Endoscopy;  Laterality: N/A;  63   Family History  Problem Relation Age of Onset  . Emphysema Mother   . Allergies Father   . Asthma Father     as a child  . Leukemia Maternal Grandmother   . Diabetes Brother   . Stroke Mother    History  Substance Use Topics  . Smoking status: Never Smoker   . Smokeless tobacco: Never Used  . Alcohol Use: No   OB History   Grav Para Term Preterm Abortions TAB SAB Ect Mult Living                 Review of Systems  All other systems reviewed and are negative  Allergies   Meperidine hcl and Montelukast sodium  Home Medications   Prior to Admission medications   Medication Sig Start Date End Date Taking? Authorizing Provider  acetaminophen (TYLENOL) 500 MG tablet Take 1,000 mg by mouth every 6 (six) hours as needed for pain or fever.    Yes Historical Provider, MD  acyclovir (ZOVIRAX) 400 MG tablet Take 400 mg by mouth 2 (two) times daily.   Yes Historical Provider, MD  albuterol (PROVENTIL HFA;VENTOLIN HFA) 108 (90 BASE) MCG/ACT inhaler Inhale 2 puffs into the lungs every 6 (six) hours as needed for wheezing or shortness of breath.   Yes Historical Provider, MD  albuterol (PROVENTIL) (2.5 MG/3ML) 0.083% nebulizer solution Take 2.5 mg by nebulization every 4 (four) hours as needed for wheezing or shortness of breath.   Yes Historical Provider, MD  citalopram (CELEXA) 20 MG tablet Take 20 mg by mouth every morning.     Yes Historical Provider, MD  fluticasone (FLONASE) 50 MCG/ACT nasal spray Place 2 sprays into the nose 2 (two) times  daily. 02/16/12  Yes Elsie Stain, MD  furosemide (LASIX) 20 MG tablet Take 20 mg by mouth every other day as needed for fluid. *Usually takes as needed for fluid retention upon weighing in**   Yes Historical Provider, MD  gabapentin (NEURONTIN) 300 MG capsule Take 300 mg by mouth 4 (four) times daily.   Yes Historical Provider, MD  mometasone-formoterol (DULERA) 200-5 MCG/ACT AERO Inhale 2 puffs into the lungs 2 (two) times daily. 03/19/12  Yes Tammy S Parrett, NP  mupirocin cream (BACTROBAN) 2 % Apply 1 application topically 3 (three) times daily.   Yes Historical Provider, MD  omeprazole (PRILOSEC) 20 MG capsule Take 1 capsule (20 mg total) by mouth 2 (two) times daily. 03/19/12  Yes Tammy S Parrett, NP  predniSONE (DELTASONE) 10 MG tablet Take 0.5 tablets (5 mg total) by mouth daily. 01/07/13  Yes Elsie Stain, MD  simvastatin (ZOCOR) 80 MG tablet Take 80 mg by mouth at bedtime.     Yes Historical Provider, MD  vitamin C  (ASCORBIC ACID) 500 MG tablet Take 500 mg by mouth 2 (two) times daily.   Yes Historical Provider, MD  warfarin (COUMADIN) 10 MG tablet Take 5 mg by mouth every evening. Take 1/2 tablet (5mg ) daily 07/13/12  Yes Historical Provider, MD  zafirlukast (ACCOLATE) 20 MG tablet Take 1 tablet (20 mg total) by mouth 2 (two) times daily. 01/07/13  Yes Elsie Stain, MD  sulfamethoxazole-trimethoprim (BACTRIM DS) 800-160 MG per tablet Take 1 tablet by mouth 2 (two) times daily.    Historical Provider, MD   BP 133/66  Pulse 60  Temp(Src) 98.8 F (37.1 C) (Oral)  Resp 16  Ht 5\' 2"  (1.575 m)  Wt 175 lb (79.379 kg)  BMI 32.00 kg/m2  SpO2 100% Physical Exam  Nursing note and vitals reviewed. Constitutional: She is oriented to person, place, and time. She appears well-developed and well-nourished. No distress.  HENT:  Head: Normocephalic and atraumatic.  Eyes: Pupils are equal, round, and reactive to light.  Neck: Normal range of motion.  Cardiovascular: Normal rate and intact distal pulses.   Pulmonary/Chest: No respiratory distress.  Abdominal: Normal appearance. She exhibits no distension.  Musculoskeletal: Normal range of motion.  See the accompanying photo  Neurological: She is alert and oriented to person, place, and time. No cranial nerve deficit.  Skin: Skin is warm and dry. No rash noted.  Psychiatric: She has a normal mood and affect. Her behavior is normal.    ED Course  Procedures (including critical care time) Labs Review Labs Reviewed  CBC - Abnormal; Notable for the following:    Hemoglobin 11.4 (*)    HCT 35.4 (*)    All other components within normal limits  BASIC METABOLIC PANEL - Abnormal; Notable for the following:    Glucose, Bld 112 (*)    Creatinine, Ser 1.41 (*)    GFR calc non Af Amer 40 (*)    GFR calc Af Amer 47 (*)    All other components within normal limits   Review of serum creatinines in the past reveal patient has had episodes of renal failure.  But she  is recovered.  Her creatinine is increased over her baseline for the last year or any speech closely followed.  She is currently not taking NSAIDs and has no history of diabetes.  Will hold her Lasix for 2 days. Imaging Review No results found.  At the present time I do not think there is any generalize  cellulitis.  Plan is to start on Keflex in have followup in 48 hours for repeat wound check and repeat creatinine.  I explained to her that this ultimately may wind up needing a graft but at this point in time I think it's too early to make that declaration.  MDM   Final diagnoses:  None          Dot Lanes, MD 05/03/13 1759

## 2013-05-03 NOTE — ED Notes (Signed)
Placed steri strips with benzoin over wound and wrapped in kerlix per verbal order from Dr. Audie Pinto.

## 2013-05-03 NOTE — Discharge Instructions (Signed)
Wound Check  Your wound appears healthy today. Your wound will heal gradually over time. Eventually a scar will form that will fade with time.  FACTORS THAT AFFECT SCAR FORMATION:   People differ in the severity in which they scar.   Scar severity varies according to location, size, and the traits you inherited from your parents (genetic predisposition).   Irritation to the wound from infection, rubbing, or chemical exposure will increase the amount of scar formation.  HOME CARE INSTRUCTIONS    If you were given a dressing, you should change it at least once a day or as instructed by your caregiver. If the bandage sticks, soak it off with a solution of hydrogen peroxide.   If the bandage becomes wet, dirty, or develops a bad smell, change it as soon as possible.   Look for signs of infection.   Only take over-the-counter or prescription medicines for pain, discomfort, or fever as directed by your caregiver.  SEEK IMMEDIATE MEDICAL CARE IF:    You have redness, swelling, or increasing pain in the wound.   You notice pus coming from the wound.   You have a fever.   You notice a bad smell coming from the wound or dressing.  Document Released: 09/26/2003 Document Revised: 03/14/2011 Document Reviewed: 12/20/2004  [employer] Patient Information 2014 [employer].

## 2013-05-07 ENCOUNTER — Ambulatory Visit (HOSPITAL_COMMUNITY): Payer: Medicare Other

## 2013-05-08 ENCOUNTER — Encounter (HOSPITAL_COMMUNITY): Payer: Medicare Other | Attending: Hematology and Oncology

## 2013-05-08 VITALS — BP 138/70 | HR 55 | Temp 97.8°F | Resp 18 | Wt 177.2 lb

## 2013-05-08 DIAGNOSIS — D801 Nonfamilial hypogammaglobulinemia: Secondary | ICD-10-CM

## 2013-05-08 DIAGNOSIS — D809 Immunodeficiency with predominantly antibody defects, unspecified: Secondary | ICD-10-CM

## 2013-05-08 MED ORDER — ACETAMINOPHEN 325 MG PO TABS: 650.0000 mg | ORAL_TABLET | Freq: Four times a day (QID) | ORAL | Status: DC | PRN

## 2013-05-08 MED ORDER — SODIUM CHLORIDE 0.9 % IV SOLN: Freq: Once | INTRAVENOUS | Status: DC

## 2013-05-08 MED ORDER — SODIUM CHLORIDE 0.9 % IJ SOLN
10.0000 mL | INTRAMUSCULAR | Status: DC | PRN
  Administered 2013-05-08: 10 mL via INTRAVENOUS

## 2013-05-08 MED ORDER — IMMUNE GLOBULIN (HUMAN) 20 GM/200ML IV SOLN
40.0000 g | Freq: Once | INTRAVENOUS | Status: AC
  Administered 2013-05-08: 40 g via INTRAVENOUS
  Filled 2013-05-08: qty 400

## 2013-05-08 MED ORDER — DEXTROSE 5 % IV SOLN
INTRAVENOUS | Status: DC
  Administered 2013-05-08: 10:00:00 via INTRAVENOUS

## 2013-05-08 NOTE — Progress Notes (Signed)
Patient reports she took Tylenol 650 mg at 8 am at home today. Tolerated infusion well.

## 2013-05-22 ENCOUNTER — Encounter: Payer: Self-pay | Admitting: Critical Care Medicine

## 2013-05-22 ENCOUNTER — Ambulatory Visit (INDEPENDENT_AMBULATORY_CARE_PROVIDER_SITE_OTHER): Payer: Medicare Other | Admitting: Critical Care Medicine

## 2013-05-22 VITALS — BP 110/78 | HR 54 | Temp 97.9°F | Ht 62.0 in | Wt 179.4 lb

## 2013-05-22 DIAGNOSIS — J45909 Unspecified asthma, uncomplicated: Secondary | ICD-10-CM

## 2013-05-22 DIAGNOSIS — K219 Gastro-esophageal reflux disease without esophagitis: Secondary | ICD-10-CM

## 2013-05-22 MED ORDER — OMEPRAZOLE 20 MG PO CPDR: 20.0000 mg | DELAYED_RELEASE_CAPSULE | Freq: Two times a day (BID) | ORAL | Status: DC

## 2013-05-22 MED ORDER — MOMETASONE FURO-FORMOTEROL FUM 200-5 MCG/ACT IN AERO: 2.0000 | INHALATION_SPRAY | Freq: Two times a day (BID) | RESPIRATORY_TRACT | Status: DC

## 2013-05-22 NOTE — Progress Notes (Signed)
Subjective:    Patient ID: [name], female    DOB: 05/24/1955, 58 y.o.   MRN: [id number]  HPI  58 y.o. .WF former smoker  Asthma , hypogammaglobulinemia, chronic sinusits d/t pseudomonas Hx of Diffuse large B-Cell Lymphoma, S/P R-CHOP x 4 cycles with intrathecal chemotherapy during cycles 3 and 4 with methotrexate and Solu-Cortef.  Treatment was stopped due to Pseudomonas sepsis after cycles 2 and 4 which was very severe.     Rituxan alone  Every month, last dose 09/30/11. Now on Hold. PET due in 01/2012   05/22/2013 Chief Complaint  Patient presents with  . Follow-up    Pt states with weather change--warmer weather she experiences incr SOB and some cough. Needs refill of Prilosec and Dulera.   No new issues with sinuses.  Warm weather is an issue.  Notes mild dry hacky cough.   Pt denies any significant sore throat, nasal congestion or excess secretions, fever, chills, sweats, unintended weight loss, pleurtic or exertional chest pain, orthopnea PND, or leg swelling Pt denies any increase in rescue therapy over baseline, denies waking up needing it or having any early am or nocturnal exacerbations of coughing/wheezing/or dyspnea. Pt also denies any obvious fluctuation in symptoms with  weather or environmental change or other alleviating or aggravating factors  Pt tripped and sliced open the leg RLE, not healing well Lymphoma in remission, Still gets IV Ig     PUL ASTHMA HISTORY 05/23/2013 01/07/2013 07/11/2012 05/25/2012 01/25/2012  Symptoms >2 days/week 0-2 days/week Daily Daily Daily  Nighttime awakenings 0-2/month 0-2/month 0-2/month >1/wk but not nightly Often--7/wk  Interference with activity Minor limitations Minor limitations Some limitations Some limitations Some limitations  SABA use 0-2 days/wk 0-2 days/wk > 2 days/wk--not > 1 x/day Daily Several times/day  Exacerbations requiring oral steroids 2 or more / year 0-1 / year 2 or more / year 2 or more / year 2 or more / year      Review of Systems  Constitutional:   No  weight loss, night sweats,  Fevers, chills, fatigue, lassitude. HEENT:   No headaches,  Difficulty swallowing,  Tooth/dental problems,  Sore throat,                No sneezing, itching, ear ache, nasal congestion, post nasal drip,   CV:  No chest pain,  Orthopnea, PND, swelling in lower extremities, anasarca, dizziness, palpitations  GI  No heartburn, indigestion, abdominal pain, nausea, vomiting, diarrhea, change in bowel habits, loss of appetite  Resp: No shortness of breath with exertion or at rest.  No excess mucus, no productive cough,  No non-productive cough,  No coughing up of blood.  No change in color of mucus.  No wheezing.  No chest wall deformity  Skin: no rash or lesions.  GU: no dysuria, change in color of urine, no urgency or frequency.  No flank pain.  MS:  No joint pain or swelling.  No decreased range of motion.  No back pain.  Psych:  No change in mood or affect. No depression or anxiety.  No memory loss.     Objective:   Physical Exam  Filed Vitals:   05/22/13 1105  BP: 110/78  Pulse: 54  Temp: 97.9 F (36.6 C)  TempSrc: Oral  Height: 5\' 2"  (1.575 m)  Weight: 179 lb 6.4 oz (81.375 kg)  SpO2: 94%    Gen: Pleasant, well-nourished, in no distress,  normal affect  ENT: No lesions,  mouth clear,  oropharynx clear,  no postnasal drip, mild nasal inflammation without purulence  Neck: No JVD, no TMG, no carotid bruits  Lungs: No use of accessory muscles, no dullness to percussion, clear without rales or rhonchi  Cardiovascular: RRR, heart sounds normal, no murmur or gallops, no peripheral edema  Abdomen: soft and NT, no HSM,  BS normal  Musculoskeletal: No deformities, no cyanosis or clubbing  Neuro: alert, non focal  Skin: Warm, no lesions or rashes  No results found.     Assessment & Plan:   Severe persistent asthma with chronic sinusitis Severe persistent asthma stable at this time Chronic  sinusitis also improved Plan Maintain inhaled medications as prescribed Return 4 months    Updated Medication List Outpatient Encounter Prescriptions as of 05/22/2013  Medication Sig  . acetaminophen (TYLENOL) 500 MG tablet Take 1,000 mg by mouth every 6 (six) hours as needed for pain or fever.   [name] Kitchen acyclovir (ZOVIRAX) 400 MG tablet Take 400 mg by mouth 2 (two) times daily.  [name] Kitchen albuterol (PROVENTIL HFA;VENTOLIN HFA) 108 (90 BASE) MCG/ACT inhaler Inhale 2 puffs into the lungs every 6 (six) hours as needed for wheezing or shortness of breath.  [name] Kitchen albuterol (PROVENTIL) (2.5 MG/3ML) 0.083% nebulizer solution Take 2.5 mg by nebulization every 4 (four) hours as needed for wheezing or shortness of breath.  . citalopram (CELEXA) 20 MG tablet Take 20 mg by mouth every morning.    . fluticasone (FLONASE) 50 MCG/ACT nasal spray Place 2 sprays into the nose 2 (two) times daily.  [name] Kitchen gabapentin (NEURONTIN) 300 MG capsule Take 300 mg by mouth 4 (four) times daily.  . mometasone-formoterol (DULERA) 200-5 MCG/ACT AERO Inhale 2 puffs into the lungs 2 (two) times daily.  [name] Kitchen omeprazole (PRILOSEC) 20 MG capsule Take 1 capsule (20 mg total) by mouth 2 (two) times daily before a meal.  . predniSONE (DELTASONE) 10 MG tablet Take 0.5 tablets (5 mg total) by mouth daily.  . simvastatin (ZOCOR) 80 MG tablet Take 80 mg by mouth at bedtime.    . vitamin C (ASCORBIC ACID) 500 MG tablet Take 500 mg by mouth 2 (two) times daily.  [name] Kitchen warfarin (COUMADIN) 10 MG tablet Take 5 mg by mouth every evening.   . zafirlukast (ACCOLATE) 20 MG tablet Take 1 tablet (20 mg total) by mouth 2 (two) times daily.  . [DISCONTINUED] mometasone-formoterol (DULERA) 200-5 MCG/ACT AERO Inhale 2 puffs into the lungs 2 (two) times daily.  . [DISCONTINUED] omeprazole (PRILOSEC) 20 MG capsule Take 1 capsule (20 mg total) by mouth 2 (two) times daily.  . [DISCONTINUED] cephALEXin (KEFLEX) 500 MG capsule Take 1 capsule (500 mg total) by mouth 3 (three) times  daily.  . [DISCONTINUED] mupirocin cream (BACTROBAN) 2 % Apply 1 application topically 3 (three) times daily.

## 2013-05-22 NOTE — Patient Instructions (Signed)
No change in medications. Return in         4 months 

## 2013-05-23 NOTE — Assessment & Plan Note (Addendum)
Severe persistent asthma stable at this time Chronic sinusitis also improved Plan Maintain inhaled medications as prescribed Return 4 months

## 2013-06-04 ENCOUNTER — Ambulatory Visit (HOSPITAL_COMMUNITY): Payer: Medicare Other

## 2013-06-04 ENCOUNTER — Other Ambulatory Visit (HOSPITAL_COMMUNITY): Payer: Medicare Other

## 2013-06-05 NOTE — Progress Notes (Signed)
This encounter was created in error - please disregard.

## 2013-06-13 ENCOUNTER — Encounter (HOSPITAL_BASED_OUTPATIENT_CLINIC_OR_DEPARTMENT_OTHER): Payer: Medicare Other

## 2013-06-13 ENCOUNTER — Ambulatory Visit (HOSPITAL_COMMUNITY): Payer: Medicare Other

## 2013-06-13 ENCOUNTER — Encounter (HOSPITAL_COMMUNITY): Payer: Self-pay

## 2013-06-13 ENCOUNTER — Telehealth (HOSPITAL_COMMUNITY): Payer: Self-pay

## 2013-06-13 ENCOUNTER — Ambulatory Visit (HOSPITAL_COMMUNITY)
Admission: RE | Admit: 2013-06-13 | Discharge: 2013-06-13 | Disposition: A | Discharge status: Home / Self Care | Payer: Medicare Other | Source: Ambulatory Visit | Attending: Hematology and Oncology | Admitting: Hematology and Oncology

## 2013-06-13 ENCOUNTER — Encounter (HOSPITAL_COMMUNITY): Payer: Medicare Other | Attending: Hematology and Oncology

## 2013-06-13 VITALS — BP 130/75 | HR 71 | Temp 98.0°F | Resp 20 | Wt 177.2 lb

## 2013-06-13 VITALS — Temp 98.8°F | Resp 20

## 2013-06-13 DIAGNOSIS — C8589 Other specified types of non-Hodgkin lymphoma, extranodal and solid organ sites: Secondary | ICD-10-CM

## 2013-06-13 DIAGNOSIS — J40 Bronchitis, not specified as acute or chronic: Secondary | ICD-10-CM | POA: Diagnosis present

## 2013-06-13 DIAGNOSIS — D801 Nonfamilial hypogammaglobulinemia: Secondary | ICD-10-CM

## 2013-06-13 DIAGNOSIS — R059 Cough, unspecified: Secondary | ICD-10-CM | POA: Insufficient documentation

## 2013-06-13 DIAGNOSIS — C8590 Non-Hodgkin lymphoma, unspecified, unspecified site: Secondary | ICD-10-CM

## 2013-06-13 DIAGNOSIS — G620 Drug-induced polyneuropathy: Secondary | ICD-10-CM

## 2013-06-13 DIAGNOSIS — R05 Cough: Secondary | ICD-10-CM | POA: Insufficient documentation

## 2013-06-13 LAB — CBC WITH DIFFERENTIAL/PLATELET
Basophils Absolute: 0 10*3/uL (ref 0.0–0.1)
Basophils Relative: 0 % (ref 0–1)
EOS PCT: 0 % (ref 0–5)
Eosinophils Absolute: 0.1 10*3/uL (ref 0.0–0.7)
HCT: 34.3 % — ABNORMAL LOW (ref 36.0–46.0)
Hemoglobin: 11.3 g/dL — ABNORMAL LOW (ref 12.0–15.0)
LYMPHS ABS: 0.7 10*3/uL (ref 0.7–4.0)
LYMPHS PCT: 4 % — AB (ref 12–46)
MCH: 29.7 pg (ref 26.0–34.0)
MCHC: 32.9 g/dL (ref 30.0–36.0)
MCV: 90 fL (ref 78.0–100.0)
Monocytes Absolute: 1.2 10*3/uL — ABNORMAL HIGH (ref 0.1–1.0)
Monocytes Relative: 7 % (ref 3–12)
Neutro Abs: 14.8 10*3/uL — ABNORMAL HIGH (ref 1.7–7.7)
Neutrophils Relative %: 89 % — ABNORMAL HIGH (ref 43–77)
PLATELETS: 219 10*3/uL (ref 150–400)
RBC: 3.81 MIL/uL — AB (ref 3.87–5.11)
RDW: 14.5 % (ref 11.5–15.5)
WBC: 16.8 10*3/uL — AB (ref 4.0–10.5)

## 2013-06-13 LAB — SEDIMENTATION RATE: Sed Rate: 52 mm/hr — ABNORMAL HIGH (ref 0–22)

## 2013-06-13 LAB — COMPREHENSIVE METABOLIC PANEL
ALBUMIN: 3.2 g/dL — AB (ref 3.5–5.2)
ALT: 12 U/L (ref 0–35)
AST: 14 U/L (ref 0–37)
Alkaline Phosphatase: 83 U/L (ref 39–117)
BUN: 12 mg/dL (ref 6–23)
CO2: 27 mEq/L (ref 19–32)
Calcium: 9 mg/dL (ref 8.4–10.5)
Chloride: 99 mEq/L (ref 96–112)
Creatinine, Ser: 1.03 mg/dL (ref 0.50–1.10)
GFR calc Af Amer: 68 mL/min — ABNORMAL LOW (ref >90)
GFR calc non Af Amer: 59 mL/min — ABNORMAL LOW (ref >90)
Glucose, Bld: 184 mg/dL — ABNORMAL HIGH (ref 70–99)
POTASSIUM: 3.5 meq/L — AB (ref 3.7–5.3)
SODIUM: 139 meq/L (ref 137–147)
TOTAL PROTEIN: 6.5 g/dL (ref 6.0–8.3)
Total Bilirubin: 0.4 mg/dL (ref 0.3–1.2)

## 2013-06-13 LAB — LACTATE DEHYDROGENASE: LDH: 166 U/L (ref 94–250)

## 2013-06-13 MED ORDER — DEXTROSE 5 % IV SOLN
Freq: Once | INTRAVENOUS | Status: AC
  Administered 2013-06-13: 10:00:00 via INTRAVENOUS

## 2013-06-13 MED ORDER — IMMUNE GLOBULIN (HUMAN) 20 GM/200ML IV SOLN
40.0000 g | Freq: Once | INTRAVENOUS | Status: AC
Start: 2013-06-13 — End: 2013-06-13
  Administered 2013-06-13: 40 g via INTRAVENOUS
  Filled 2013-06-13: qty 400

## 2013-06-13 MED ORDER — AZITHROMYCIN 250 MG PO TABS: ORAL_TABLET | ORAL | Status: DC

## 2013-06-13 MED ORDER — SODIUM CHLORIDE 0.9 % IV SOLN: Freq: Once | INTRAVENOUS | Status: DC

## 2013-06-13 NOTE — Progress Notes (Signed)
[facility]  OFFICE PROGRESS NOTE  Leonides Grills, MD [place] [address]  DIAGNOSIS: Hypogammaglobulinaemia, unspecified  NHL (non-Hodgkin's lymphoma) - Plan: NM PET Image Restag (PS) Skull Base To Thigh  Tracheobronchitis - Plan: DG Chest 2 View  Chief Complaint  Patient presents with  . IgG deficiency  . She is large B-cell lymphoma    CURRENT THERAPY: Intravenous gamma globulin 40 g every [redacted] weeks along with surveillance for non-Hodgkin's lymphoma per NCCN guidelines.  INTERVAL HISTORY: DOCIA KLAR 58 y.o. female returns for followup of diffuse large B-cell lymphoma status post R. CHOP x4 cycles (08/14/2009- 10/23/2009) along with intrathecal chemotherapy during cycles 3 and 4 with methotrexate, 12 mg and SoluCortef 50 mg. Her chemotherapy had to be stopped due to severe Pseudomonas sepsis after cycles 2 and cycles 4. These episodes were life-threatening.  AND  IgG deficiency, 40 g of IVIG ever 4 weeks to prevent recurrent infections. For the past 3 weeks she has had nightly fevers up to 102 associated with cough and production of yellowish sputum. She took Tylenol for relief. She called no one about this. She denies any abdominal pain, diarrhea, dysuria, nasal drip, headache, or skin rash. Appetite has been good with no nausea, vomiting, headache, or seizures.  MEDICAL HISTORY: Past Medical History  Diagnosis Date  . Non Hodgkin's lymphoma   . Allergic rhinitis   . GERD (gastroesophageal reflux disease)   . Asthma   . Chronic sinusitis   . Respiratory failure   . Cavitary lung disease   . PONV (postoperative nausea and vomiting)   . Endotracheally intubated   . Myocardial infarction 2011  . Anxiety   . Chronic kidney disease   . Anemia   . 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    ALLERGIES:  is allergic to meperidine hcl and montelukast sodium.  MEDICATIONS: has a current medication list which includes the following prescription(s): acetaminophen, acyclovir, albuterol, albuterol, citalopram, fluticasone, gabapentin, mometasone-formoterol, omeprazole, prednisone, simvastatin, vitamin c, warfarin, zafirlukast, and azithromycin, and the following Facility-Administered Medications: sodium chloride.  SURGICAL HISTORY:  Past Surgical History  Procedure Laterality Date  . Vesicovaginal fistula closure w/ tah    . Nasal sinus surgery    . Neck surgery      x 2   . Portacath placement  7/11    Removed 6/12  . Basal cell carcinoma excision  03/2011    scalp  . Port-a-cath removal    . Peripherally inserted central catheter insertion    . Picc removal    . Tracheostomy    . Abdominal hysterectomy    . Lung lobectomy      right  . Breast surgery Right   . Colonoscopy N/A 11/14/2012    Procedure: COLONOSCOPY;  Surgeon: Rogene Houston, MD;  Location: [facility];  Service: Endoscopy;  Laterality: N/A;  830    FAMILY HISTORY: family history includes Allergies in her father; Asthma in her father; Diabetes in her brother; Emphysema in her mother; Leukemia in her maternal grandmother; Stroke in her mother.  SOCIAL HISTORY:  reports that she has never smoked. She has never used smokeless tobacco. She reports that she does not drink alcohol or  use illicit drugs.  REVIEW OF SYSTEMS:  Other than that discussed above is noncontributory.  PHYSICAL EXAMINATION: ECOG PERFORMANCE STATUS: 1 - Symptomatic but completely ambulatory  Temperature 98.8 F (37.1 C), resp. rate 20.  GENERAL:alert, no distress and comfortable SKIN: skin color, texture, turgor are normal, no rashes  or significant lesions EYES: PERLA; Conjunctiva are pink and non-injected, sclera clear SINUSES: No redness or tenderness over maxillary or ethmoid sinuses OROPHARYNX:no exudate, no erythema on lips, buccal mucosa, or tongue. NECK: supple, thyroid normal size, non-tender, without nodularity. No masses CHEST: Increased AP diameter with light port in place. No breast masses. LYMPH:  no palpable lymphadenopathy in the cervical, axillary or inguinal LUNGS: Bilateral expiratory rhonchi and wheezing. No dullness to percussion. HEART: regular rate & rhythm and no murmurs. ABDOMEN:abdomen soft, non-tender and normal bowel sounds MUSCULOSKELETAL:no cyanosis of digits and no clubbing. Range of motion normal.  NEURO: alert & oriented x 3 with fluent speech, no focal motor/sensory deficits   LABORATORY DATA:   IgG level just prior to administering IV IgG on 04/09/2013 was 744 which is normal.  Appointment on 06/13/2013  Component Date Value Ref Range Status  . Sodium 06/13/2013 139  137 - 147 mEq/L Final  . Potassium 06/13/2013 3.5* 3.7 - 5.3 mEq/L Final  . Chloride 06/13/2013 99  96 - 112 mEq/L Final  . CO2 06/13/2013 27  19 - 32 mEq/L Final  . Glucose, Bld 06/13/2013 184* 70 - 99 mg/dL Final  . BUN 06/13/2013 12  6 - 23 mg/dL Final  . Creatinine, Ser 06/13/2013 1.03  0.50 - 1.10 mg/dL Final  . Calcium 06/13/2013 9.0  8.4 - 10.5 mg/dL Final  . Total Protein 06/13/2013 6.5  6.0 - 8.3 g/dL Final  . Albumin 06/13/2013 3.2* 3.5 - 5.2 g/dL Final  . AST 06/13/2013 14  0 - 37 U/L Final  . ALT 06/13/2013 12  0 - 35 U/L Final  . Alkaline Phosphatase 06/13/2013 83  39 - 117 U/L Final  . Total Bilirubin 06/13/2013 0.4  0.3 - 1.2 mg/dL Final  . GFR calc non Af Amer 06/13/2013 59* >90 mL/min Final  . GFR calc Af Amer 06/13/2013 68* >90 mL/min Final   Comment: (NOTE)                          The eGFR has been calculated using the CKD EPI equation.                          This calculation has not been  validated in all clinical situations.                          eGFR's persistently <90 mL/min signify possible Chronic Kidney                          Disease.  [name] Kitchen LDH 06/13/2013 166  94 - 250 U/L Final  . Sed Rate 06/13/2013 52* 0 - 22 mm/hr Final  . WBC 06/13/2013 16.8* 4.0 - 10.5 K/uL Final  . RBC 06/13/2013 3.81* 3.87 - 5.11 MIL/uL Final  . Hemoglobin 06/13/2013 11.3* 12.0 - 15.0 g/dL Final  . HCT 06/13/2013 34.3* 36.0 - 46.0 % Final  . MCV 06/13/2013 90.0  78.0 - 100.0 fL Final  . MCH 06/13/2013 29.7  26.0 - 34.0 pg Final  . MCHC 06/13/2013  32.9  30.0 - 36.0 g/dL Final  . RDW 06/13/2013 14.5  11.5 - 15.5 % Final  . Platelets 06/13/2013 219  150 - 400 K/uL Final  . Neutrophils Relative % 06/13/2013 89* 43 - 77 % Final  . Neutro Abs 06/13/2013 14.8* 1.7 - 7.7 K/uL Final  . Lymphocytes Relative 06/13/2013 4* 12 - 46 % Final  . Lymphs Abs 06/13/2013 0.7  0.7 - 4.0 K/uL Final  . Monocytes Relative 06/13/2013 7  3 - 12 % Final  . Monocytes Absolute 06/13/2013 1.2* 0.1 - 1.0 K/uL Final  . Eosinophils Relative 06/13/2013 0  0 - 5 % Final  . Eosinophils Absolute 06/13/2013 0.1  0.0 - 0.7 K/uL Final  . Basophils Relative 06/13/2013 0  0 - 1 % Final  . Basophils Absolute 06/13/2013 0.0  0.0 - 0.1 K/uL Final    PATHOLOGY: No new pathology.  Urinalysis    Component Value Date/Time   COLORURINE YELLOW 08/03/2011 1743   APPEARANCEUR CLEAR 08/03/2011 1743   LABSPEC 1.025 08/03/2011 1743   PHURINE 6.0 08/03/2011 1743   GLUCOSEU NEGATIVE 08/03/2011 1743   HGBUR SMALL* 08/03/2011 1743   BILIRUBINUR NEGATIVE 08/03/2011 1743   KETONESUR NEGATIVE 08/03/2011 1743   PROTEINUR NEGATIVE 08/03/2011 1743   UROBILINOGEN 0.2 08/03/2011 1743   NITRITE NEGATIVE 08/03/2011 1743   LEUKOCYTESUR NEGATIVE 08/03/2011 1743    RADIOGRAPHIC STUDIES:  Status: Final result         PACS Images    Show images for DG Chest 2 View         Study Result    CLINICAL DATA: Cough  EXAM:  CHEST 2 VIEW    COMPARISON: 11/05/2012  FINDINGS:  Mild cardiomegaly. No pleural effusion. No pneumothorax. Clear  lungs.  IMPRESSION:  No active cardiopulmonary disease.  Electronically Signed  By: Maryclare Bean M.D.  On: 06/13/2013 14:54    Status: Final result         PACS Images    Show images for DG Chest 2 View         Study Result    CLINICAL DATA: Cough  EXAM:  CHEST 2 VIEW  COMPARISON: 11/05/2012  FINDINGS:  Mild cardiomegaly. No pleural effusion. No pneumothorax. Clear  lungs.  IMPRESSION:  No active cardiopulmonary disease.  Electronically Signed  By: Maryclare Bean M.D.  On: 06/13/2013 14:54     ASSESSMENT:  1. Diffuse large B-cell lymphoma, no evidence of disease. Stage IV B. status post R. CHOP x4 cycles (08/14/2009- 10/23/2009) along with intrathecal chemotherapy during cycles 3 and 4 with methotrexate, 12 mg and SoluCortef 50 mg. Her chemotherapy had to be stopped due to severe Pseudomonas sepsis after cycles 2 and cycles 4. These episodes were life-threatening.  2. IgG deficiency, 40 g of IVIG ever 4 weeks to prevent recurrent infections, now with possible pneumonia versus tracheobronchitis versus recurrent lymphoma 3. Peripheral neuropathy, chemotherapy-induced.  4. Recurrent sinus infections.  5. Allergic rhinitis and asthma, on treatment    PLAN:  #1. Intravenous Gamma globulin 40 g. #2. Chest x-ray PA and lateral today. #3. Use nebulizer at home twice a day. #4. Z-Pak 500 mg today followed by 250 mg daily for 4 days. #5. PET CT scan next week. Patient was told to call day after the scan is done for discussion of results. #6. Tentative followup in one month with CBC, chem profile, and immunofixation.   All questions were answered. The patient knows to call the clinic with any problems, questions or concerns.  We can certainly see the patient much sooner if necessary.   I spent 25 minutes counseling the patient face to face. The total time spent in the  appointment was 30 minutes.    Doroteo Bradford, MD 06/13/2013 3:13 PM  DISCLAIMER:  This note was dictated with voice recognition software.  Similar sounding words can inadvertently be transcribed inaccurately and may not be corrected upon review.

## 2013-06-13 NOTE — Addendum Note (Signed)
Addended by: Mellissa Kohut on: 06/13/2013 04:26 PM   Modules accepted: Orders

## 2013-06-13 NOTE — Telephone Encounter (Signed)
Message copied by Mellissa Kohut on Thu Jun 13, 2013  4:44 PM ------      Message from: Farrel Gobble A      Created: Thu Jun 13, 2013  3:15 PM       Please call Mrs. Hefter and tell her her chest x-ray showed no evidence of pneumonia or enlarged lymph nodes. She probably has tracheobronchitis. ------

## 2013-06-13 NOTE — Progress Notes (Signed)
Tolerated well. Patient is aware to stop by radiology for chest xray before she leaves today.

## 2013-06-13 NOTE — Patient Instructions (Signed)
[facility] Discharge Instructions  RECOMMENDATIONS MADE BY THE CONSULTANT AND ANY TEST RESULTS WILL BE SENT TO YOUR REFERRING PHYSICIAN.  EXAM FINDINGS BY THE PHYSICIAN TODAY AND SIGNS OR SYMPTOMS TO REPORT TO CLINIC OR PRIMARY PHYSICIAN: Exam and findings as discussed by Dr. Barnet Glasgow.  Will check chest xray today and get you started on a antibiotic.  Will get PET scheduled to see if there's any evidence of recurrence of your lymphoma.  Use your nebulizer twice daily to help you get your lungs expanded better  MEDICATIONS PRESCRIBED:  Z-pak - take as directed.  INSTRUCTIONS/FOLLOW-UP: PET scan 6/22 at [facility].  Arrival time is 7:45am.  Nothing to eat or drink after midnight night before scan.  No chewing gum, mints, nothing with sugar in it. Follow-up in 1 month with blood work and office visit.  Thank you for choosing [facility] to provide your oncology and hematology care.  To afford each patient quality time with our providers, please arrive at least 15 minutes before your scheduled appointment time.  With your help, our goal is to use those 15 minutes to complete the necessary work-up to ensure our physicians have the information they need to help with your evaluation and healthcare recommendations.    Effective January 1st, 2014, we ask that you re-schedule your appointment with our physicians should you arrive 10 or more minutes late for your appointment.  We strive to give you quality time with our providers, and arriving late affects you and other patients whose appointments are after yours.    Again, thank you for choosing [facility].  Our hope is that these requests will decrease the amount of time that you wait before being seen by our physicians.       _____________________________________________________________  Should you have questions after your visit to [facility], please contact our office at [phone]  [phone] between the hours of 8:30 a.m. and 5:00 p.m.  Voicemails left after 4:30 p.m. will not be returned until the following business day.  For prescription refill requests, have your pharmacy contact our office with your prescription refill request.

## 2013-06-13 NOTE — Telephone Encounter (Signed)
Patient notified

## 2013-06-17 LAB — IMMUNOFIXATION ELECTROPHORESIS
IGG (IMMUNOGLOBIN G), SERUM: 693 mg/dL — AB (ref 690–1700)
IGM, SERUM: 74 mg/dL (ref 52–322)
IgA: 61 mg/dL — ABNORMAL LOW (ref 69–380)
TOTAL PROTEIN ELP: 5.8 g/dL — AB (ref 6.0–8.3)

## 2013-06-17 LAB — BETA 2 MICROGLOBULIN, SERUM: Beta-2 Microglobulin: 2.9 mg/L — ABNORMAL HIGH (ref <=2.51)

## 2013-06-17 NOTE — Progress Notes (Signed)
Labs drawn

## 2013-06-24 ENCOUNTER — Ambulatory Visit (HOSPITAL_COMMUNITY)
Admission: RE | Admit: 2013-06-24 | Discharge: 2013-06-24 | Disposition: A | Discharge status: Home / Self Care | Payer: Medicare Other | Source: Ambulatory Visit | Attending: Hematology and Oncology | Admitting: Hematology and Oncology

## 2013-06-24 DIAGNOSIS — C8590 Non-Hodgkin lymphoma, unspecified, unspecified site: Secondary | ICD-10-CM

## 2013-06-24 DIAGNOSIS — C8589 Other specified types of non-Hodgkin lymphoma, extranodal and solid organ sites: Secondary | ICD-10-CM | POA: Insufficient documentation

## 2013-06-24 LAB — GLUCOSE, CAPILLARY: Glucose-Capillary: 81 mg/dL (ref 70–99)

## 2013-06-24 MED ORDER — FLUDEOXYGLUCOSE F - 18 (FDG) INJECTION
9.9000 | Freq: Once | INTRAVENOUS | Status: AC | PRN
  Administered 2013-06-24: 9.9 via INTRAVENOUS

## 2013-06-25 ENCOUNTER — Telehealth (HOSPITAL_COMMUNITY): Payer: Self-pay | Admitting: Hematology and Oncology

## 2013-06-25 ENCOUNTER — Telehealth (HOSPITAL_COMMUNITY): Payer: Self-pay

## 2013-06-25 NOTE — Telephone Encounter (Signed)
Call from patient requesting results of PET scan.  Can be reached at [phone].

## 2013-06-25 NOTE — Telephone Encounter (Signed)
See telephone note.

## 2013-06-26 IMAGING — US US RENAL
1 series · 6 of 6 positions shown · non-contrast
Comparison: PET CT 11/04/2010.

CLINICAL DATA: 56-year-old female with stage III chronic renal
disease.  Lymphoma.

RENAL/URINARY TRACT ULTRASOUND COMPLETE

[Series 1: us renal · 0.25mm/px · 6 of 6 slices shown]
[im 1/6]
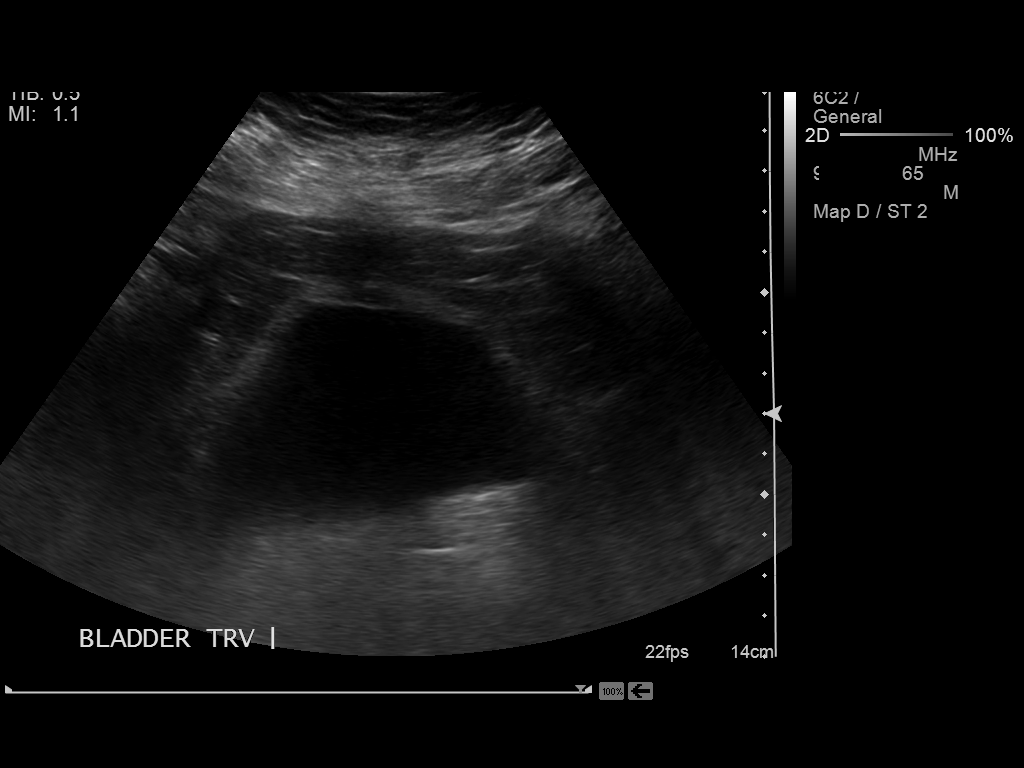
[im 2/6]
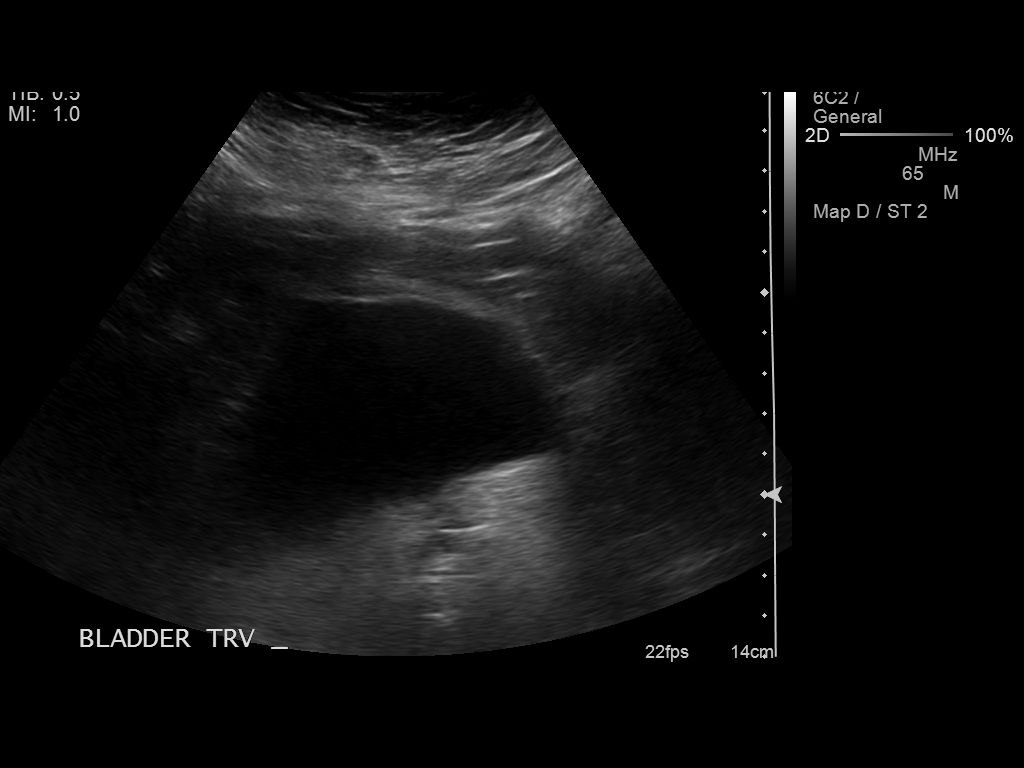
[im 3/6]
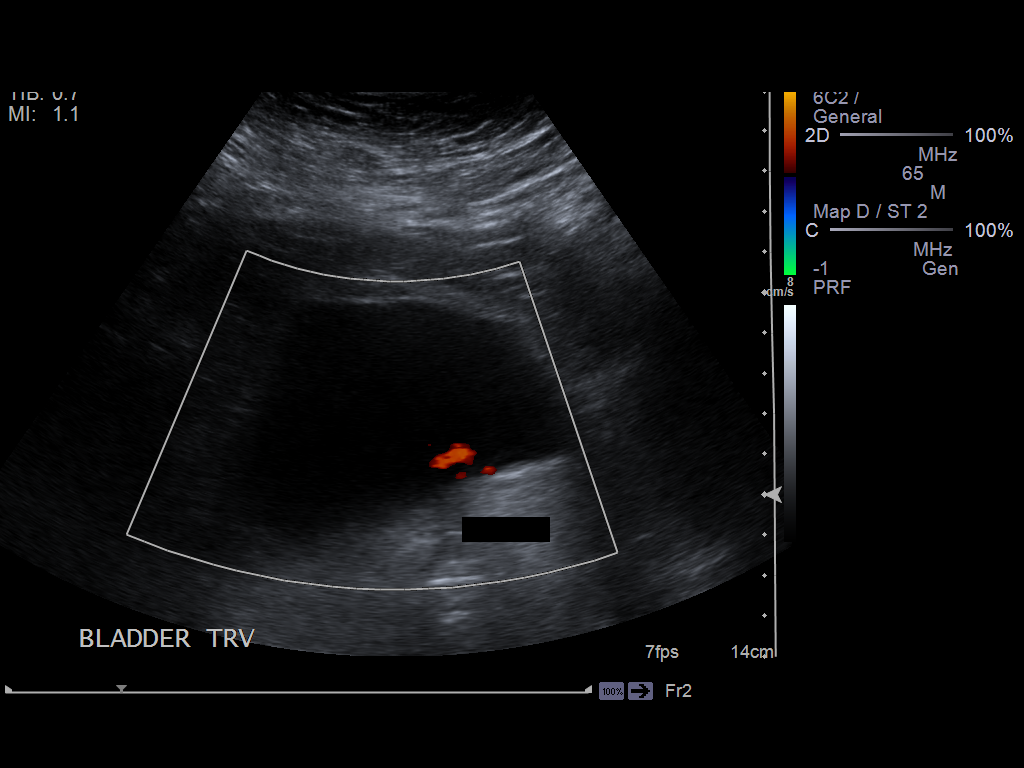
[im 4/6]
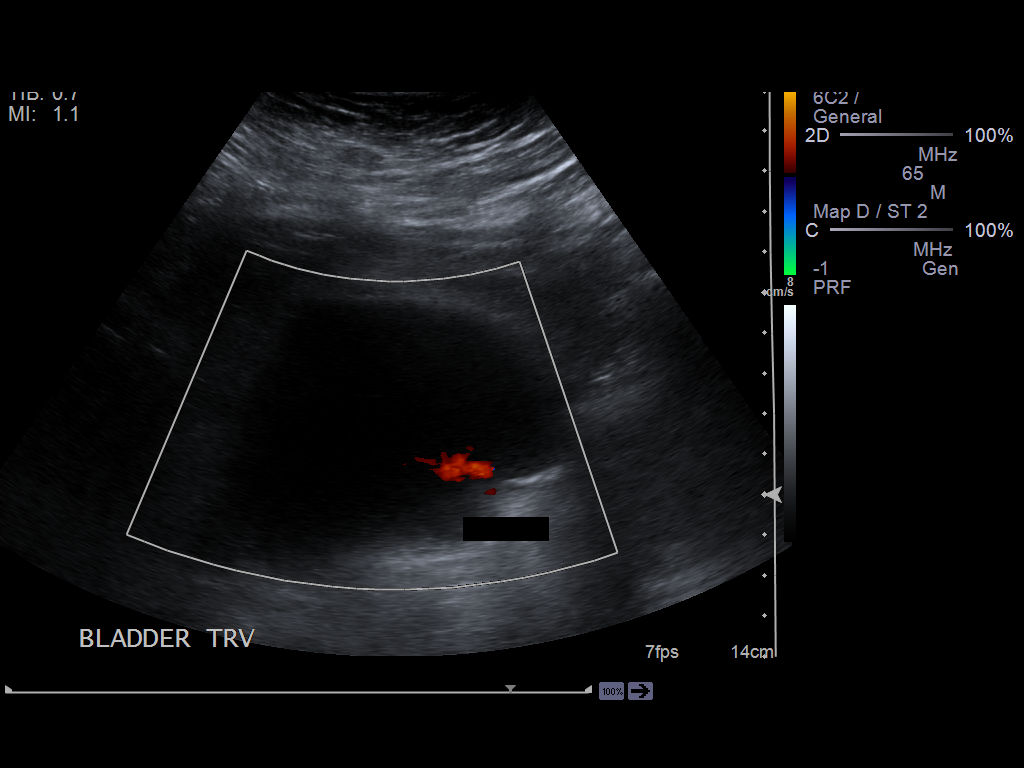
[im 5/6]
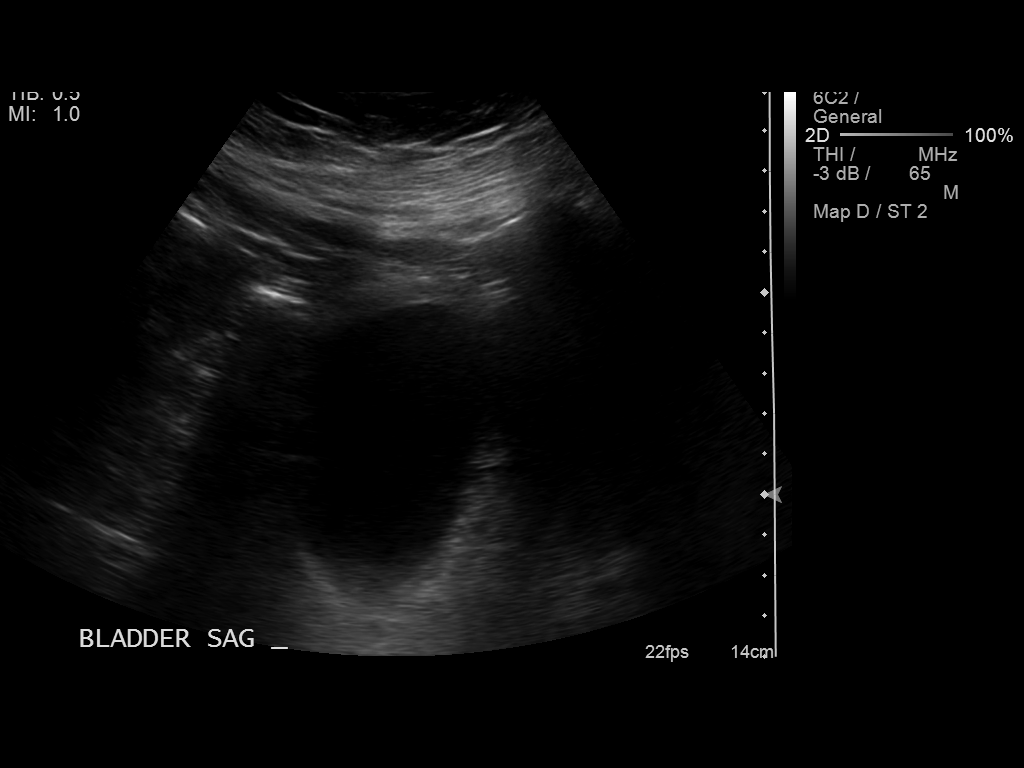
[im 6/6]
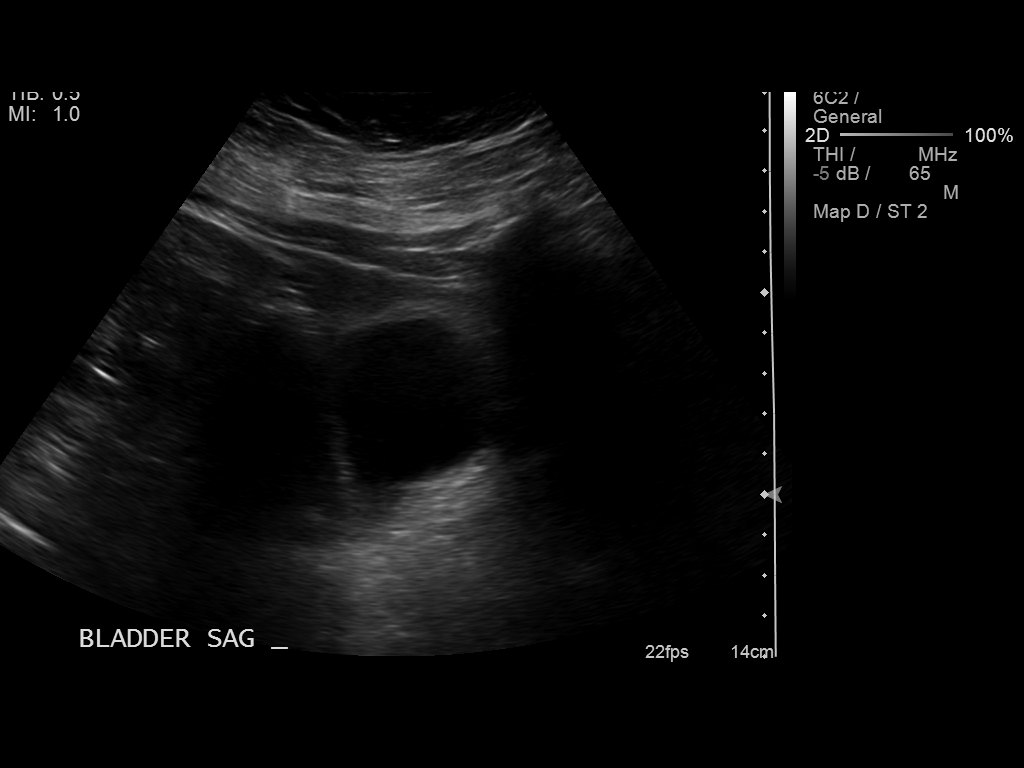

[6 of 6 positions shown; findings below may reference images not displayed]

FINDINGS: Right Kidney:  No hydronephrosis.  Renal length 12.5 cm.  Cortical
echogenicity within normal limits.  No focal right renal lesion.

Left Kidney:  Chronic hypoplastic / atrophic left kidney without
hydronephrosis.  Renal length 8.5 cm.  Cortical echotexture within
normal limits.

Bladder:  Unremarkable.  Left ureteral jet visible during scanning.
IMPRESSION: Stable appearance of the kidneys since 11/04/2010.  No acute renal
findings.

## 2013-07-11 ENCOUNTER — Encounter (HOSPITAL_COMMUNITY): Payer: Medicare Other | Attending: Hematology and Oncology

## 2013-07-11 ENCOUNTER — Encounter (HOSPITAL_BASED_OUTPATIENT_CLINIC_OR_DEPARTMENT_OTHER): Payer: Medicare Other

## 2013-07-11 ENCOUNTER — Encounter (HOSPITAL_COMMUNITY): Payer: Self-pay

## 2013-07-11 VITALS — BP 119/54 | HR 79 | Temp 98.0°F | Resp 20 | Wt 177.9 lb

## 2013-07-11 DIAGNOSIS — D801 Nonfamilial hypogammaglobulinemia: Secondary | ICD-10-CM | POA: Insufficient documentation

## 2013-07-11 DIAGNOSIS — C8589 Other specified types of non-Hodgkin lymphoma, extranodal and solid organ sites: Secondary | ICD-10-CM | POA: Diagnosis present

## 2013-07-11 DIAGNOSIS — D869 Sarcoidosis, unspecified: Secondary | ICD-10-CM

## 2013-07-11 DIAGNOSIS — C8590 Non-Hodgkin lymphoma, unspecified, unspecified site: Secondary | ICD-10-CM

## 2013-07-11 LAB — COMPREHENSIVE METABOLIC PANEL
ALBUMIN: 3.6 g/dL (ref 3.5–5.2)
ALK PHOS: 95 U/L (ref 39–117)
ALT: 18 U/L (ref 0–35)
AST: 19 U/L (ref 0–37)
Anion gap: 11 (ref 5–15)
BUN: 12 mg/dL (ref 6–23)
CO2: 29 mEq/L (ref 19–32)
Calcium: 8.9 mg/dL (ref 8.4–10.5)
Chloride: 101 mEq/L (ref 96–112)
Creatinine, Ser: 1.11 mg/dL — ABNORMAL HIGH (ref 0.50–1.10)
GFR calc Af Amer: 62 mL/min — ABNORMAL LOW (ref >90)
GFR calc non Af Amer: 54 mL/min — ABNORMAL LOW (ref >90)
Glucose, Bld: 178 mg/dL — ABNORMAL HIGH (ref 70–99)
POTASSIUM: 3.9 meq/L (ref 3.7–5.3)
Sodium: 141 mEq/L (ref 137–147)
TOTAL PROTEIN: 6.8 g/dL (ref 6.0–8.3)
Total Bilirubin: 0.3 mg/dL (ref 0.3–1.2)

## 2013-07-11 LAB — CBC WITH DIFFERENTIAL/PLATELET
BASOS ABS: 0 10*3/uL (ref 0.0–0.1)
BASOS PCT: 0 % (ref 0–1)
Eosinophils Absolute: 0.1 10*3/uL (ref 0.0–0.7)
Eosinophils Relative: 1 % (ref 0–5)
HCT: 37.8 % (ref 36.0–46.0)
Hemoglobin: 12.1 g/dL (ref 12.0–15.0)
Lymphocytes Relative: 7 % — ABNORMAL LOW (ref 12–46)
Lymphs Abs: 0.8 10*3/uL (ref 0.7–4.0)
MCH: 29 pg (ref 26.0–34.0)
MCHC: 32 g/dL (ref 30.0–36.0)
MCV: 90.6 fL (ref 78.0–100.0)
Monocytes Absolute: 0.4 10*3/uL (ref 0.1–1.0)
Monocytes Relative: 4 % (ref 3–12)
NEUTROS PCT: 88 % — AB (ref 43–77)
Neutro Abs: 10.2 10*3/uL — ABNORMAL HIGH (ref 1.7–7.7)
PLATELETS: 186 10*3/uL (ref 150–400)
RBC: 4.17 MIL/uL (ref 3.87–5.11)
RDW: 14.9 % (ref 11.5–15.5)
WBC: 11.6 10*3/uL — ABNORMAL HIGH (ref 4.0–10.5)

## 2013-07-11 NOTE — Patient Instructions (Signed)
[place] [facility] Discharge Instructions  RECOMMENDATIONS MADE BY THE CONSULTANT AND ANY TEST RESULTS WILL BE SENT TO YOUR REFERRING PHYSICIAN.  EXAM FINDINGS BY THE PHYSICIAN TODAY AND SIGNS OR SYMPTOMS TO REPORT TO CLINIC OR PRIMARY PHYSICIAN:   Return for IVIG on Monday July 13 @ 8:45.   Start Prednisone 20mg  a day alternating with 10mg  daily for the next 4 weeks. Continue this dose until you see Dr. Barnet Glasgow in 4 weeks and get further instructions.  Return for labs & to see Dr. Barnet Glasgow on Thursday August 6 @ 12:40.    Thank you for choosing [facility] to provide your oncology and hematology care.  To afford each patient quality time with our providers, please arrive at least 15 minutes before your scheduled appointment time.  With your help, our goal is to use those 15 minutes to complete the necessary work-up to ensure our physicians have the information they need to help with your evaluation and healthcare recommendations.    Effective January 1st, 2014, we ask that you re-schedule your appointment with our physicians should you arrive 10 or more minutes late for your appointment.  We strive to give you quality time with our providers, and arriving late affects you and other patients whose appointments are after yours.    Again, thank you for choosing [facility].  Our hope is that these requests will decrease the amount of time that you wait before being seen by our physicians.       _____________________________________________________________  Should you have questions after your visit to [facility], please contact our office at [phone] between the hours of 8:30 a.m. and 5:00 p.m.  Voicemails left after 4:30 p.m. will not be returned until the following business day.  For prescription refill requests, have your pharmacy contact our office with your prescription refill request.

## 2013-07-11 NOTE — Progress Notes (Signed)
Labs drawn for aice,cbcd,[id number]

## 2013-07-11 NOTE — Progress Notes (Signed)
[facility]  OFFICE PROGRESS NOTE  Leonides Grills, MD [place] [address]  DIAGNOSIS: NHL (non-Hodgkin's lymphoma)  Hypogammaglobulinaemia, unspecified  Sarcoidosis - Plan: Angiotensin converting enzyme, Angiotensin converting enzyme  No chief complaint on file.   CURRENT THERAPY: Intravenous gamma globulin 40 g every [redacted] weeks along with surveillance for non-Hodgkin's lymphoma and sarcoidosis.   INTERVAL HISTORY: [name] 58 y.o. female returns for followup of diffuse large B-cell lymphoma status post R. CHOP x4 cycles (08/14/2009- 10/23/2009) along with intrathecal chemotherapy during cycles 3 and 4 with methotrexate, 12 mg and SoluCortef 50 mg. Her chemotherapy had to be stopped due to severe Pseudomonas sepsis after cycles 2 and cycles 4. These episodes were life-threatening.  AND  IgG deficiency, 40 g of IVIG ever 4 weeks to prevent recurrent infections. Last month she has nightly fevers up to 102 with cough and production areola sputum but PET/CT scan failed to reveal evidence of worsening lymphoma. Previously noted lymph nodes due to sarcoidosis were still present. The patient was treated with antibiotics and had improvement in symptoms after telephone conversation 3 weeks ago. She has had persistent cough with congestion which shortness of breath on exertion and fever 2 days last week, 100.2 and 100.3. Expectoration of yellowish. She denies any earache, sore throat, abdominal pain, nausea, vomiting, diarrhea, constipation, with persistent skin rash involving both upper extremities. She denies any lower extremity swelling or redness, PND, orthopnea, palpitations, headache, or seizures.    MEDICAL HISTORY: Past Medical History  Diagnosis Date  . Non Hodgkin's lymphoma   . Allergic rhinitis   . GERD (gastroesophageal reflux disease)   . Asthma   . Chronic sinusitis   . Respiratory failure   . Cavitary lung  disease   . PONV (postoperative nausea and vomiting)   . Endotracheally intubated   . Myocardial infarction 2011  . Anxiety   . Chronic kidney disease   . Anemia   . 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 Diffuse large B-cell lymphoma status post R. CHOP x4 cycles (08/14/2009- 10/23/2009) along with intrathecal chemotherapy during cycles 3 and 4 with methotrexate, 12 mg and SoluCortef 50 mg. Her chemotherapy had to be stopped due to severe Pseudomonas sepsis after cycles 2 and cycles 4. These episodes were life-threatening.  AND  IgG deficiency, 40 g of IVIG ever 4 weeks to prevent recurrent infections  ALLERGIES:  is allergic to meperidine hcl and montelukast sodium.  MEDICATIONS: has a current medication list which includes the following prescription(s): acetaminophen, acyclovir, albuterol, albuterol, citalopram, fluticasone, gabapentin, mometasone-formoterol, omeprazole, prednisone, simvastatin, vitamin c, warfarin, and zafirlukast.  SURGICAL HISTORY:  Past Surgical History  Procedure Laterality Date  . Vesicovaginal fistula closure w/ tah    . Nasal sinus surgery    . Neck surgery      x 2   . Portacath placement  7/11    Removed 6/12  . Basal cell carcinoma excision  03/2011    scalp  . Port-a-cath removal    . Peripherally inserted central catheter insertion    . Picc removal    . Tracheostomy    . Abdominal hysterectomy    . Lung lobectomy      right  .  Breast surgery Right   . Colonoscopy N/A 11/14/2012    Procedure: COLONOSCOPY;  Surgeon: Malissa Hippo, MD;  Location: [facility];  Service: Endoscopy;  Laterality: N/A;  830    FAMILY HISTORY: family  history includes Allergies in her father; Asthma in her father; Diabetes in her brother; Emphysema in her mother; Leukemia in her maternal grandmother; Stroke in her mother.  SOCIAL HISTORY:  reports that she has never smoked. She has never used smokeless tobacco. She reports that she does not drink alcohol or use illicit drugs.  REVIEW OF SYSTEMS:  Other than that discussed above is noncontributory.  PHYSICAL EXAMINATION: ECOG PERFORMANCE STATUS: 1 - Symptomatic but completely ambulatory  Blood pressure 119/54, pulse 79, temperature 98 F (36.7 C), temperature source Oral, resp. rate 20, weight 177 lb 14.4 oz (80.695 kg), SpO2 96.00%.  GENERAL:alert, no distress and comfortable. Slightly cushingoid. SKIN: skin color, texture, turgor are normal, no rashes or significant lesions EYES: PERLA; Conjunctiva are pink and non-injected, sclera clear SINUSES: No redness or tenderness over maxillary or ethmoid sinuses OROPHARYNX:no exudate, no erythema on lips, buccal mucosa, or tongue. NECK: supple, thyroid normal size, non-tender, without nodularity. No masses CHEST: Increased AP diameter with no breast masses. LYMPH:  no palpable lymphadenopathy in the cervical, axillary or inguinal LUNGS: Increased AP diameter with scattered rhonchi bilaterally. HEART: regular rate & rhythm and no murmurs. ABDOMEN:abdomen soft, non-tender and normal bowel sounds MUSCULOSKELETAL:no cyanosis of digits and no clubbing. Range of motion normal.  NEURO: alert & oriented x 3 with fluent speech, no focal motor/sensory deficits   LABORATORY DATA: Appointment on 07/11/2013  Component Date Value Ref Range Status  . WBC 07/11/2013 11.6* 4.0 - 10.5 K/uL Final  . RBC 07/11/2013 4.17  3.87 - 5.11 MIL/uL Final  . Hemoglobin 07/11/2013 12.1  12.0 - 15.0 g/dL Final  . HCT 25/89/4834 37.8  36.0 - 46.0 % Final  . MCV 07/11/2013 90.6  78.0 - 100.0 fL Final  . MCH 07/11/2013 29.0  26.0 - 34.0 pg Final  . MCHC 07/11/2013 32.0   30.0 - 36.0 g/dL Final  . RDW 75/83/0746 14.9  11.5 - 15.5 % Final  . Platelets 07/11/2013 186  150 - 400 K/uL Final  . Neutrophils Relative % 07/11/2013 88* 43 - 77 % Final  . Neutro Abs 07/11/2013 10.2* 1.7 - 7.7 K/uL Final  . Lymphocytes Relative 07/11/2013 7* 12 - 46 % Final  . Lymphs Abs 07/11/2013 0.8  0.7 - 4.0 K/uL Final  . Monocytes Relative 07/11/2013 4  3 - 12 % Final  . Monocytes Absolute 07/11/2013 0.4  0.1 - 1.0 K/uL Final  . Eosinophils Relative 07/11/2013 1  0 - 5 % Final  . Eosinophils Absolute 07/11/2013 0.1  0.0 - 0.7 K/uL Final  . Basophils Relative 07/11/2013 0  0 - 1 % Final  . Basophils Absolute 07/11/2013 0.0  0.0 - 0.1 K/uL Final  Hospital Outpatient Visit on 06/24/2013  Component Date Value Ref Range Status  . Glucose-Capillary 06/24/2013 81  70 - 99 mg/dL Final  Infusion on 00/29/8473  Component Date Value Ref Range Status  . Sodium 06/13/2013 139  137 - 147 mEq/L Final  . Potassium 06/13/2013 3.5* 3.7 - 5.3 mEq/L Final  . Chloride 06/13/2013 99  96 - 112 mEq/L Final  . CO2 06/13/2013 27  19 - 32 mEq/L Final  . Glucose, Bld 06/13/2013 184* 70 - 99 mg/dL Final  . BUN 08/56/9437 12  6 - 23  mg/dL Final  . Creatinine, Ser 06/13/2013 1.03  0.50 - 1.10 mg/dL Final  . Calcium 33/49/1251 9.0  8.4 - 10.5 mg/dL Final  . Total Protein 06/13/2013 6.5  6.0 - 8.3 g/dL Final  . Albumin 89/02/919 3.2* 3.5 - 5.2 g/dL Final  . AST 34/87/5032 14  0 - 37 U/L Final  . ALT 06/13/2013 12  0 - 35 U/L Final  . Alkaline Phosphatase 06/13/2013 83  39 - 117 U/L Final  . Total Bilirubin 06/13/2013 0.4  0.3 - 1.2 mg/dL Final  . GFR calc non Af Amer 06/13/2013 59* >90 mL/min Final  . GFR calc Af Amer 06/13/2013 68* >90 mL/min Final   Comment: (NOTE)                          The eGFR has been calculated using the CKD EPI equation.                          This calculation has not been validated in all clinical situations.                          eGFR's persistently <90 mL/min  signify possible Chronic Kidney                          Disease.  [name] Kitchen LDH 06/13/2013 166  94 - 250 U/L Final  . Sed Rate 06/13/2013 52* 0 - 22 mm/hr Final  . Beta-2 Microglobulin 06/13/2013 2.90* <=2.51 mg/L Final   Performed at [facility]  . WBC 06/13/2013 16.8* 4.0 - 10.5 K/uL Final  . RBC 06/13/2013 3.81* 3.87 - 5.11 MIL/uL Final  . Hemoglobin 06/13/2013 11.3* 12.0 - 15.0 g/dL Final  . HCT 56/01/2940 34.3* 36.0 - 46.0 % Final  . MCV 06/13/2013 90.0  78.0 - 100.0 fL Final  . MCH 06/13/2013 29.7  26.0 - 34.0 pg Final  . MCHC 06/13/2013 32.9  30.0 - 36.0 g/dL Final  . RDW 05/84/3883 14.5  11.5 - 15.5 % Final  . Platelets 06/13/2013 219  150 - 400 K/uL Final  . Neutrophils Relative % 06/13/2013 89* 43 - 77 % Final  . Neutro Abs 06/13/2013 14.8* 1.7 - 7.7 K/uL Final  . Lymphocytes Relative 06/13/2013 4* 12 - 46 % Final  . Lymphs Abs 06/13/2013 0.7  0.7 - 4.0 K/uL Final  . Monocytes Relative 06/13/2013 7  3 - 12 % Final  . Monocytes Absolute 06/13/2013 1.2* 0.1 - 1.0 K/uL Final  . Eosinophils Relative 06/13/2013 0  0 - 5 % Final  . Eosinophils Absolute 06/13/2013 0.1  0.0 - 0.7 K/uL Final  . Basophils Relative 06/13/2013 0  0 - 1 % Final  . Basophils Absolute 06/13/2013 0.0  0.0 - 0.1 K/uL Final  . Total Protein ELP 06/13/2013 5.8* 6.0 - 8.3 g/dL Final  . IgG (Immunoglobin G), Serum 06/13/2013 693* 690 - 1700 mg/dL Final  . IgA 20/95/0080 61* 69 - 380 mg/dL Final  . IgM, Serum 13/06/9588 74  52 - 322 mg/dL Final  . Immunofix Electr Int 06/13/2013 (NOTE)   Final   Comment: No monoclonal protein identified.                          Reviewed by Dallas Breeding, MD, PhD, FCAP (Electronic Signature on  File)                          Performed at [facility]: No new pathology.  Urinalysis    Component Value Date/Time   COLORURINE YELLOW 08/03/2011 1743   APPEARANCEUR CLEAR 08/03/2011 1743   LABSPEC 1.025 08/03/2011 1743    PHURINE 6.0 08/03/2011 1743   GLUCOSEU NEGATIVE 08/03/2011 1743   HGBUR SMALL* 08/03/2011 1743   BILIRUBINUR NEGATIVE 08/03/2011 1743   KETONESUR NEGATIVE 08/03/2011 1743   PROTEINUR NEGATIVE 08/03/2011 1743   UROBILINOGEN 0.2 08/03/2011 1743   NITRITE NEGATIVE 08/03/2011 1743   LEUKOCYTESUR NEGATIVE 08/03/2011 1743    RADIOGRAPHIC STUDIES: Dg Chest 2 View  06/13/2013   CLINICAL DATA:  Cough  EXAM: CHEST  2 VIEW  COMPARISON:  11/05/2012  FINDINGS: Mild cardiomegaly. No pleural effusion. No pneumothorax. Clear lungs.  IMPRESSION: No active cardiopulmonary disease.   Electronically Signed   By: Maryclare Bean M.D.   On: 06/13/2013 14:54   Nm Pet Image Restag (ps) Skull Base To Thigh  06/24/2013   CLINICAL DATA:  SUBSEQUENT treatment strategy for NON-HODGKIN'S LYMPHOMA.  EXAM: NUCLEAR MEDICINE PET SKULL BASE TO THIGH  TECHNIQUE: 9.9 mCi F-18 FDG was injected intravenously. Full-ring PET imaging was performed from the skull base to thigh after the radiotracer. CT data was obtained and used for attenuation correction and anatomic localization.  FASTING BLOOD GLUCOSE:  Value: 81 mg/dl  COMPARISON:  CT CHEST ABDOMEN PELVIS 11/09/2012 AND PET 01/17/2012.  FINDINGS: NECK  Mild asymmetric left or pharyngeal activity (PET image 23) is likely physiologic. No abnormal hypermetabolism in the neck. CT images show air-fluid levels in the paranasal sinuses.  CHEST  No hypermetabolic mediastinal or hilar lymph nodes. An 8 mm nodule in the left lower lobe (CT series 6, image 43) has an SUV max of 2.0. No additional areas of abnormal hypermetabolism in the chest.  CT images show atherosclerotic calcification of the arterial vasculature. Extensive collateral vascularity is seen along the subcutaneous chest wall. No pericardial or pleural effusion. Scattered peribronchovascular nodularity, bronchial thickening and bronchiectasis with mild architectural distortion, as before.  ABDOMEN/PELVIS  There are a few scattered hazy subcutaneous  nodules, measuring up to 1.4 cm along the right lateral abdominal wall (CT series 4, image 132) with an SUV max of 3.0 (PET image 132). No abnormal hypermetabolism in the liver, adrenal glands, spleen or pancreas. No hypermetabolic lymph nodes.  CT images shows the liver, gallbladder, adrenal glands and right kidney to be grossly unremarkable. Left kidney appears somewhat atrophic. Spleen, pancreas, stomach and bowel are grossly unremarkable. Ovaries are visualized. The uterus appears absent. No free fluid.  SKELETON  No abnormal osseous hypermetabolism. Degenerative changes are seen in the spine.  IMPRESSION: 1. Hypermetabolic left lower lobe nodule. Lesion is new from 11/09/2012 and may be inflammatory, given surrounding pulmonary parenchymal changes suggestive of post infectious scarring or mycobacterium avium complex (MAC). However, adenocarcinoma cannot be definitively excluded. Recommend CT chest without contrast in 3 months in further evaluation. 2. A few scattered mildly hypermetabolic hazy subcutaneous nodules are stable. 3. Air-fluid levels in the paranasal sinuses.   Electronically Signed   By: Lorin Picket M.D.   On: 06/24/2013 10:22    ASSESSMENT:  1. Diffuse large B-cell lymphoma, no evidence of disease. Stage IV B. status post R. CHOP x4 cycles (08/14/2009- 10/23/2009) along with intrathecal chemotherapy during cycles 3 and 4 with methotrexate, 12 mg  and SoluCortef 50 mg. Her chemotherapy had to be stopped due to severe Pseudomonas sepsis after cycles 2 and cycles 4. These episodes were life-threatening.  2. IgG deficiency, 40 g of IVIG ever 4 weeks to prevent recurrent infections. 3. Peripheral neuropathy, chemotherapy-induced.  4. Recurrent sinus infections.  5. Allergic rhinitis and asthma, on treatment. 6. Sarcoidosis.   PLAN:  1. Increase prednisone to 20 mg alternating with 10 mg daily. 2. 40 Gm IV IgG on 07/15/2013. 3. Followup in 4 weeks with CBC, chem profile, LDH, beta-2  microglobulin, reticulocyte count, IgG level, and ACE level.   All questions were answered. The patient knows to call the clinic with any problems, questions or concerns. We can certainly see the patient much sooner if necessary.   I spent 25 minutes counseling the patient face to face. The total time spent in the appointment was 30 minutes.    Doroteo Bradford, MD 07/11/2013 1:15 PM  DISCLAIMER:  This note was dictated with voice recognition software.  Similar sounding words can inadvertently be transcribed inaccurately and may not be corrected upon review.

## 2013-07-12 LAB — ANGIOTENSIN CONVERTING ENZYME: ANGIOTENSIN-CONVERTING ENZYME: 46 U/L (ref 8–52)

## 2013-07-15 ENCOUNTER — Encounter (HOSPITAL_BASED_OUTPATIENT_CLINIC_OR_DEPARTMENT_OTHER): Payer: Medicare Other

## 2013-07-15 ENCOUNTER — Encounter (HOSPITAL_COMMUNITY): Payer: Self-pay

## 2013-07-15 VITALS — BP 147/72 | HR 59 | Temp 98.0°F | Resp 20 | Wt 176.6 lb

## 2013-07-15 DIAGNOSIS — D801 Nonfamilial hypogammaglobulinemia: Secondary | ICD-10-CM

## 2013-07-15 LAB — IMMUNOFIXATION ELECTROPHORESIS
IGM, SERUM: 80 mg/dL (ref 52–322)
IgA: 74 mg/dL (ref 69–380)
IgG (Immunoglobin G), Serum: 830 mg/dL (ref 690–1700)
Total Protein ELP: 6.4 g/dL (ref 6.0–8.3)

## 2013-07-15 MED ORDER — DEXTROSE 5 % IV SOLN
INTRAVENOUS | Status: DC
  Administered 2013-07-15: 09:00:00 via INTRAVENOUS

## 2013-07-15 MED ORDER — IMMUNE GLOBULIN (HUMAN) 20 GM/200ML IV SOLN
40.0000 g | Freq: Once | INTRAVENOUS | Status: AC
  Administered 2013-07-15: 40 g via INTRAVENOUS
  Filled 2013-07-15: qty 400

## 2013-07-15 MED ORDER — ACETAMINOPHEN 325 MG PO TABS: 650.0000 mg | ORAL_TABLET | Freq: Four times a day (QID) | ORAL | Status: DC | PRN

## 2013-07-15 MED ORDER — SODIUM CHLORIDE 0.9 % IV SOLN
Freq: Once | INTRAVENOUS | Status: DC
Start: 2013-07-15 — End: 2013-07-15

## 2013-07-15 NOTE — Progress Notes (Signed)
Tolerated well

## 2013-08-05 IMAGING — CR DG CHEST 2V
2 series · 2 of 2 positions shown · non-contrast
Comparison: 08/03/2011

CLINICAL DATA: Asthma and bronchitis

CHEST - 2 VIEW

[view not recorded (1 of 2)]
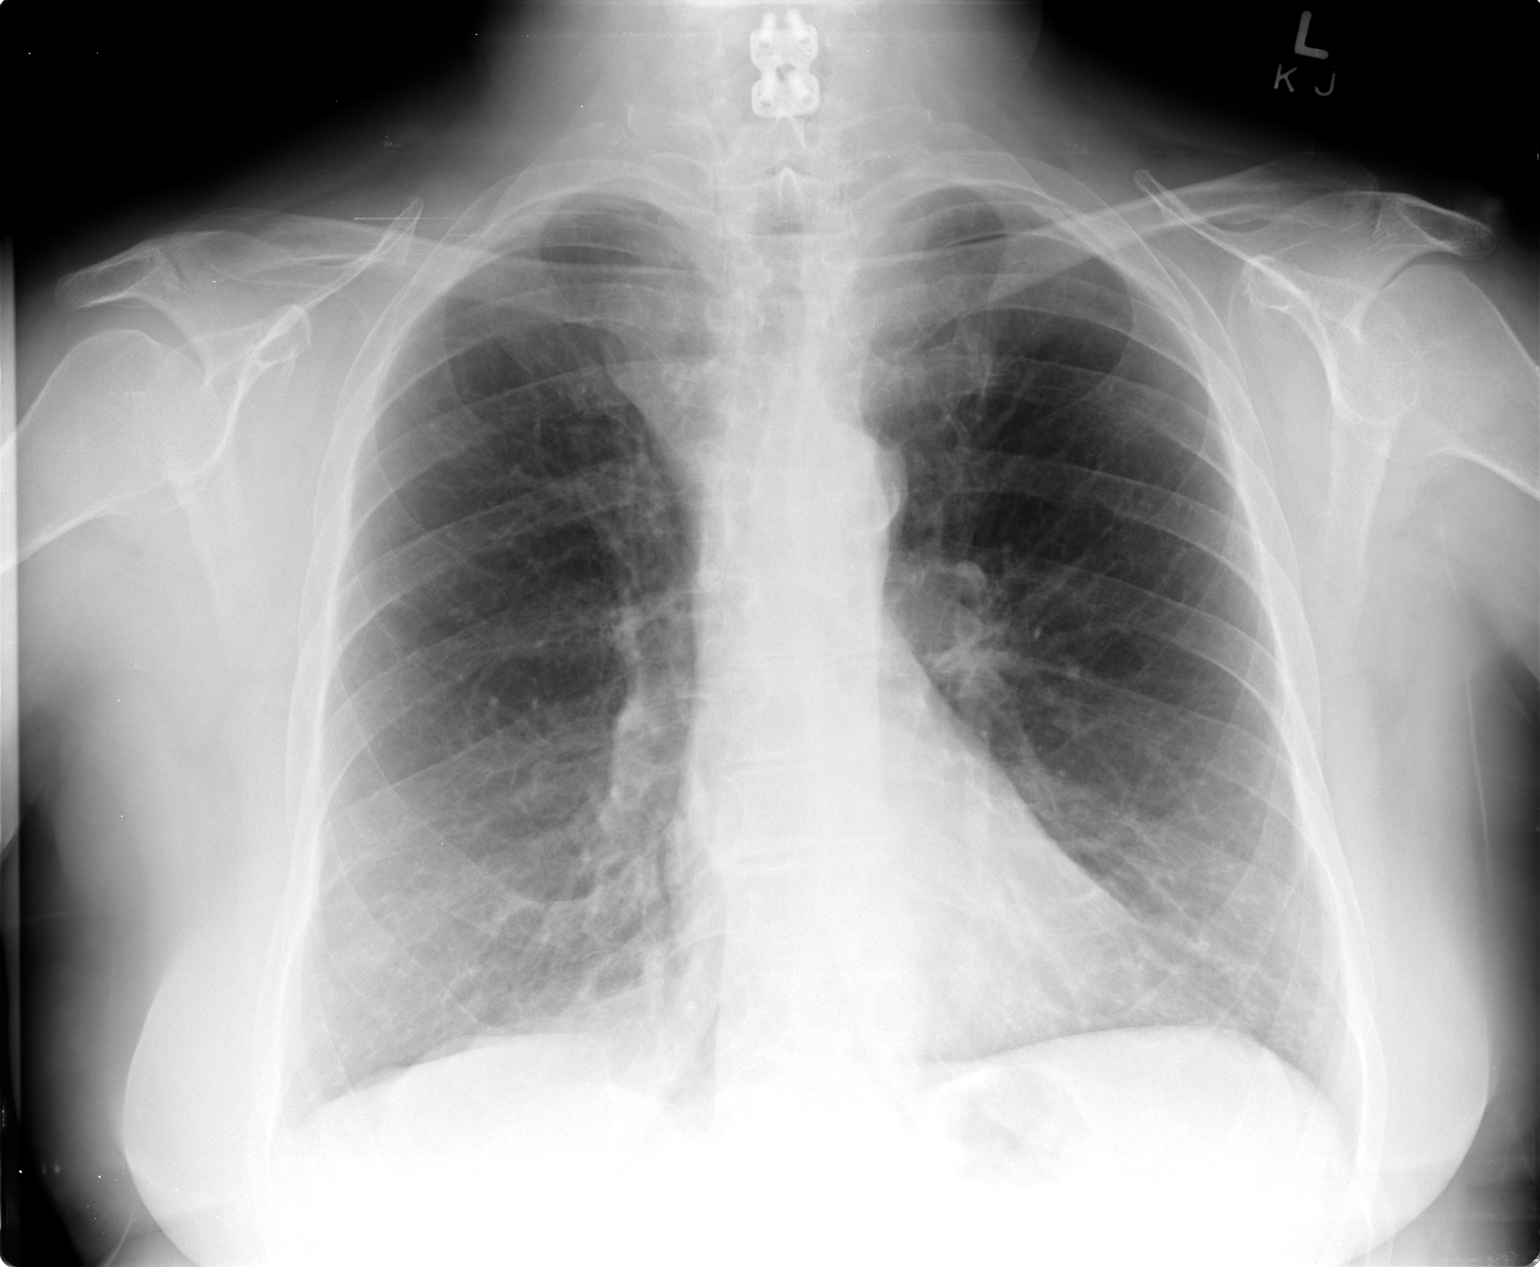

[view not recorded (2 of 2)]
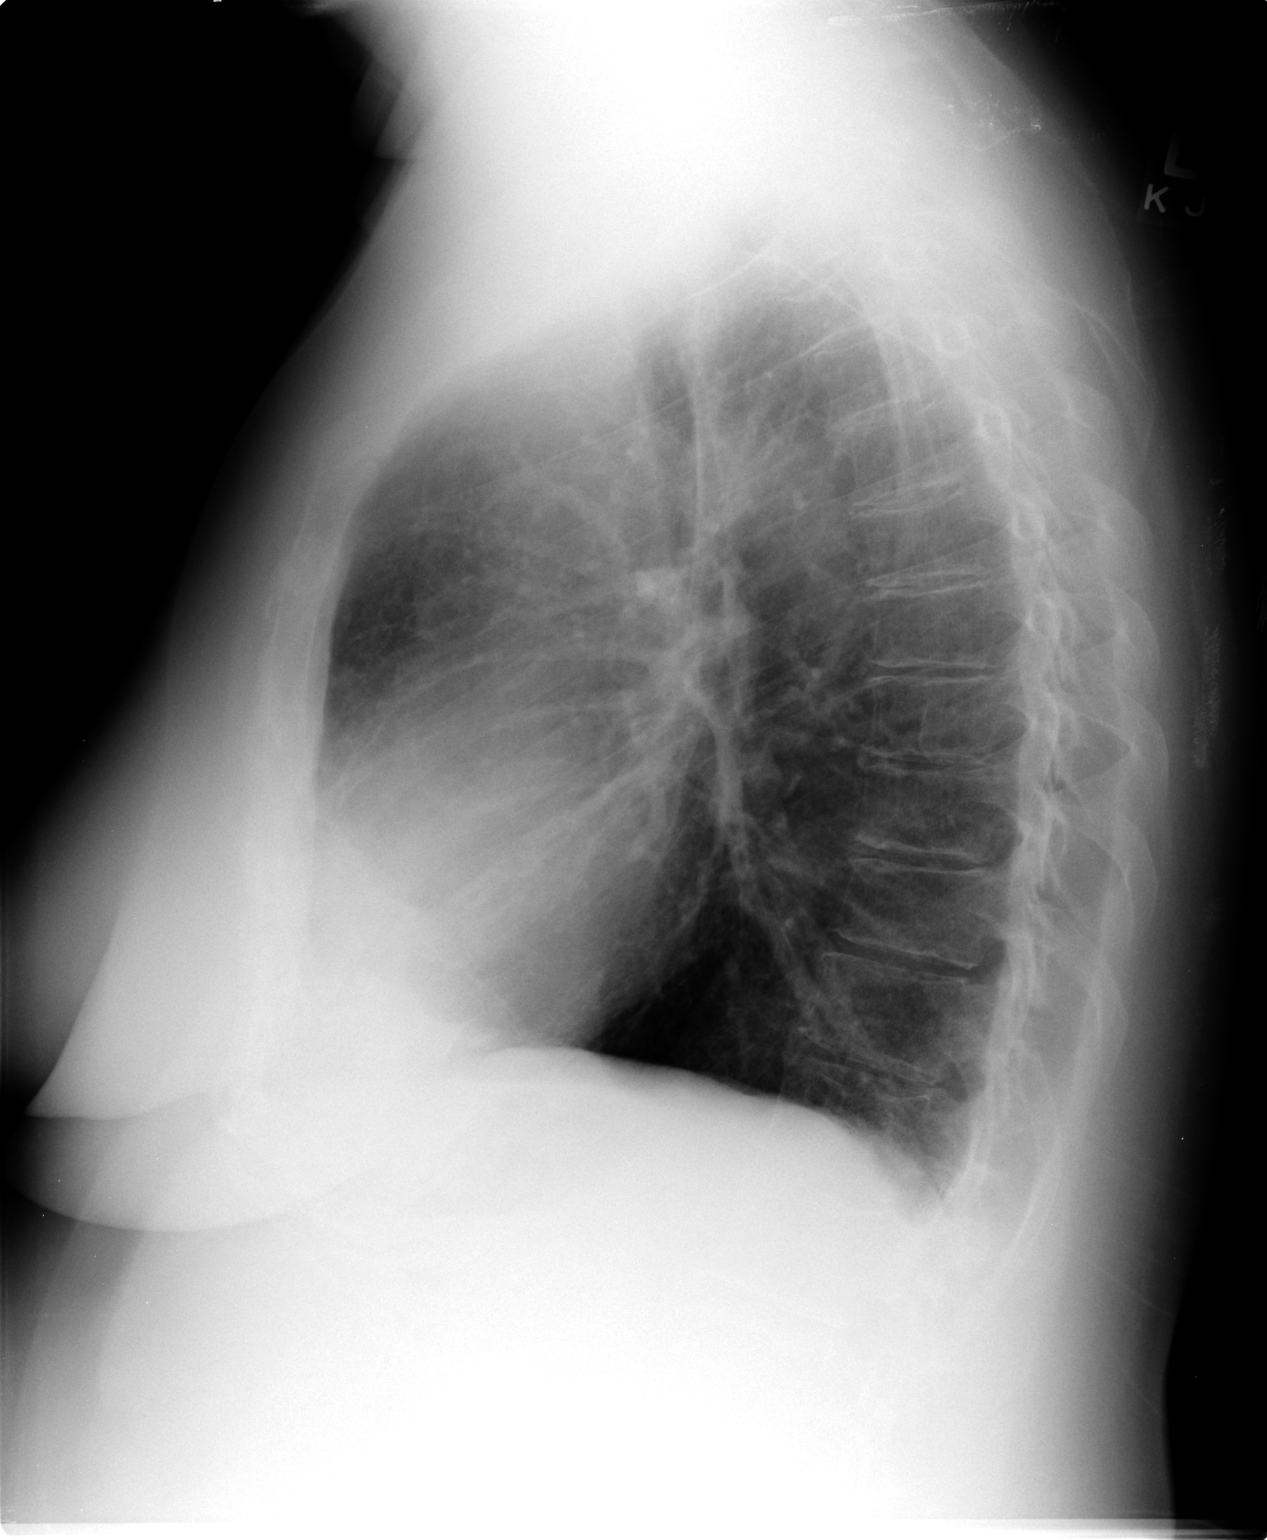

[2 of 2 positions shown; findings below may reference images not displayed]

FINDINGS: Heart size is normal.

No pleural effusion or edema.

No airspace consolidation identified.  Chronic interstitial
coarsening appears similar to previous exam.
IMPRESSION: 1.  No acute cardiopulmonary abnormalities.
2.  Chronic interstitial coarsening.  Similar to previous exam.

## 2013-08-08 ENCOUNTER — Other Ambulatory Visit (HOSPITAL_COMMUNITY): Payer: Medicare Other

## 2013-08-08 ENCOUNTER — Ambulatory Visit (HOSPITAL_COMMUNITY): Payer: Medicare Other

## 2013-08-15 ENCOUNTER — Encounter (HOSPITAL_BASED_OUTPATIENT_CLINIC_OR_DEPARTMENT_OTHER): Payer: Medicare Other

## 2013-08-15 ENCOUNTER — Encounter (HOSPITAL_COMMUNITY): Payer: Medicare Other | Attending: Hematology and Oncology

## 2013-08-15 ENCOUNTER — Encounter (HOSPITAL_COMMUNITY): Payer: Self-pay

## 2013-08-15 ENCOUNTER — Encounter (HOSPITAL_COMMUNITY): Payer: Medicare Other

## 2013-08-15 VITALS — BP 142/78 | HR 51 | Temp 98.0°F | Resp 18 | Wt 176.6 lb

## 2013-08-15 DIAGNOSIS — D801 Nonfamilial hypogammaglobulinemia: Secondary | ICD-10-CM

## 2013-08-15 DIAGNOSIS — D869 Sarcoidosis, unspecified: Secondary | ICD-10-CM | POA: Insufficient documentation

## 2013-08-15 DIAGNOSIS — C8590 Non-Hodgkin lymphoma, unspecified, unspecified site: Secondary | ICD-10-CM

## 2013-08-15 DIAGNOSIS — C8589 Other specified types of non-Hodgkin lymphoma, extranodal and solid organ sites: Secondary | ICD-10-CM | POA: Diagnosis present

## 2013-08-15 LAB — CBC WITH DIFFERENTIAL/PLATELET
BASOS PCT: 0 % (ref 0–1)
Basophils Absolute: 0 10*3/uL (ref 0.0–0.1)
EOS ABS: 0.1 10*3/uL (ref 0.0–0.7)
Eosinophils Relative: 1 % (ref 0–5)
HCT: 37.3 % (ref 36.0–46.0)
Hemoglobin: 11.8 g/dL — ABNORMAL LOW (ref 12.0–15.0)
Lymphocytes Relative: 14 % (ref 12–46)
Lymphs Abs: 1.2 10*3/uL (ref 0.7–4.0)
MCH: 29 pg (ref 26.0–34.0)
MCHC: 31.6 g/dL (ref 30.0–36.0)
MCV: 91.6 fL (ref 78.0–100.0)
Monocytes Absolute: 0.5 10*3/uL (ref 0.1–1.0)
Monocytes Relative: 6 % (ref 3–12)
NEUTROS PCT: 79 % — AB (ref 43–77)
Neutro Abs: 7 10*3/uL (ref 1.7–7.7)
Platelets: 176 10*3/uL (ref 150–400)
RBC: 4.07 MIL/uL (ref 3.87–5.11)
RDW: 15.1 % (ref 11.5–15.5)
WBC: 8.8 10*3/uL (ref 4.0–10.5)

## 2013-08-15 LAB — URINALYSIS, ROUTINE W REFLEX MICROSCOPIC
BILIRUBIN URINE: NEGATIVE
GLUCOSE, UA: NEGATIVE mg/dL
KETONES UR: NEGATIVE mg/dL
Leukocytes, UA: NEGATIVE
Nitrite: NEGATIVE
PH: 6 (ref 5.0–8.0)
Protein, ur: NEGATIVE mg/dL
SPECIFIC GRAVITY, URINE: 1.01 (ref 1.005–1.030)
Urobilinogen, UA: 0.2 mg/dL (ref 0.0–1.0)

## 2013-08-15 LAB — LACTATE DEHYDROGENASE: LDH: 234 U/L (ref 94–250)

## 2013-08-15 LAB — COMPREHENSIVE METABOLIC PANEL
ALT: 16 U/L (ref 0–35)
AST: 18 U/L (ref 0–37)
Albumin: 3.4 g/dL — ABNORMAL LOW (ref 3.5–5.2)
Alkaline Phosphatase: 83 U/L (ref 39–117)
Anion gap: 10 (ref 5–15)
BILIRUBIN TOTAL: 0.4 mg/dL (ref 0.3–1.2)
BUN: 13 mg/dL (ref 6–23)
CO2: 31 meq/L (ref 19–32)
CREATININE: 1.06 mg/dL (ref 0.50–1.10)
Calcium: 9 mg/dL (ref 8.4–10.5)
Chloride: 102 mEq/L (ref 96–112)
GFR calc Af Amer: 66 mL/min — ABNORMAL LOW (ref >90)
GFR, EST NON AFRICAN AMERICAN: 57 mL/min — AB (ref >90)
Glucose, Bld: 89 mg/dL (ref 70–99)
POTASSIUM: 3.7 meq/L (ref 3.7–5.3)
Sodium: 143 mEq/L (ref 137–147)
Total Protein: 6.7 g/dL (ref 6.0–8.3)

## 2013-08-15 LAB — URINE MICROSCOPIC-ADD ON

## 2013-08-15 LAB — RETICULOCYTES
RBC.: 4.07 MIL/uL (ref 3.87–5.11)
Retic Count, Absolute: 77.3 10*3/uL (ref 19.0–186.0)
Retic Ct Pct: 1.9 % (ref 0.4–3.1)

## 2013-08-15 LAB — ANGIOTENSIN CONVERTING ENZYME: Angiotensin-Converting Enzyme: 34 U/L (ref 8–52)

## 2013-08-15 MED ORDER — DEXTROSE 5 % IV SOLN
INTRAVENOUS | Status: DC
  Administered 2013-08-15: 09:00:00 via INTRAVENOUS

## 2013-08-15 MED ORDER — IMMUNE GLOBULIN (HUMAN) 20 GM/200ML IV SOLN
40.0000 g | Freq: Once | INTRAVENOUS | Status: AC
  Administered 2013-08-15: 40 g via INTRAVENOUS
  Filled 2013-08-15: qty 400

## 2013-08-15 MED ORDER — SODIUM CHLORIDE 0.9 % IV SOLN: Freq: Once | INTRAVENOUS | Status: DC

## 2013-08-15 MED ORDER — SODIUM CHLORIDE 0.9 % IJ SOLN
10.0000 mL | Freq: Once | INTRAMUSCULAR | Status: AC
  Administered 2013-08-15: 10 mL via INTRAVENOUS

## 2013-08-15 MED ORDER — FLUTICASONE-SALMETEROL 500-50 MCG/DOSE IN AEPB: INHALATION_SPRAY | RESPIRATORY_TRACT | Status: DC

## 2013-08-15 NOTE — Progress Notes (Signed)
[facility]  OFFICE PROGRESS NOTE  Leonides Grills, MD [address] [place] [address]  DIAGNOSIS: Hypogammaglobulinaemia, unspecified  NHL (non-Hodgkin's lymphoma)  Sarcoidosis  Chief Complaint  Patient presents with  . IgG deficiency  . Sarcoidosis  . History of diffuse large B-cell lymphoma    CURRENT THERAPY: Intravenous gamma globulin 40 g monthly due to IgG deficiency.  INTERVAL HISTORY: [name] 58 y.o. female returns for followup of diffuse large B-cell lymphoma status post R. CHOP x4 cycles (08/14/2009- 10/23/2009) along with intrathecal chemotherapy during cycles 3 and 4 with methotrexate, 12 mg and SoluCortef 50 mg. Her chemotherapy had to be stopped due to severe Pseudomonas sepsis after cycles 2 and cycles 4. These episodes were life-threatening.  AND  IgG deficiency, 40 g of IVIG ever 4 weeks to prevent recurrent infections. She still complains of chronic cough with expectoration of greenish to yellow sputum. 3 days ago she develop urinary urgency with frequency and self medicated with over-the-counter Azo dye preparation. She denies any vaginal discharge or itchiness, diarrhea, constipation, sore throat, earache, abdominal pain, nausea, vomiting, diarrhea, constipation, melena, hematochezia, hematuria, vaginal bleeding, skin rash, headache, or seizures.    MEDICAL HISTORY: Past Medical History  Diagnosis Date  . Non Hodgkin's lymphoma   . Allergic rhinitis   . GERD (gastroesophageal reflux disease)   . Asthma   . Chronic sinusitis   . Respiratory failure   . Cavitary lung disease   . PONV (postoperative nausea and vomiting)   . Endotracheally intubated   . Myocardial infarction 2011  . Anxiety   . Chronic kidney disease   . Anemia   . 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   Diffuse large B-cell lymphoma status post R. CHOP x4 cycles (08/14/2009- 10/23/2009) along with intrathecal chemotherapy during cycles 3 and 4 with methotrexate, 12 mg and SoluCortef 50 mg. Her chemotherapy had to be stopped due to severe Pseudomonas sepsis after cycles 2 and cycles 4. These episodes were life-threatening.  AND  IgG deficiency, 40 g of IVIG ever 4 weeks to prevent recurrent infections  ALLERGIES:  is allergic to meperidine hcl and montelukast sodium.  MEDICATIONS: has a current medication list which includes the following prescription(s): acetaminophen, acyclovir, albuterol, albuterol, citalopram, fluticasone, fluticasone-salmeterol, gabapentin, mometasone-formoterol, omeprazole, prednisone, simvastatin, vitamin c, warfarin, and zafirlukast, and the following Facility-Administered Medications: sodium chloride and dextrose.  SURGICAL HISTORY:  Past Surgical History  Procedure Laterality Date  . Vesicovaginal fistula closure w/ tah    . Nasal sinus surgery    . Neck surgery      x 2   . Portacath placement  7/11    Removed 6/12  . Basal cell carcinoma excision  03/2011    scalp  . Port-a-cath removal    . Peripherally inserted central catheter insertion    . Picc removal    . Tracheostomy    . Abdominal hysterectomy    . Lung lobectomy      right  . Breast surgery Right   . Colonoscopy N/A 11/14/2012    Procedure: COLONOSCOPY;  Surgeon: Rogene Houston, MD;  Location: [facility];  Service: Endoscopy;  Laterality: N/A;  830    FAMILY HISTORY: family  history includes Allergies in her father; Asthma in her father; Diabetes in her brother; Emphysema in her mother; Leukemia in her maternal grandmother; Stroke in her mother.  SOCIAL HISTORY:  reports that she has never  smoked. She has never used smokeless tobacco. She reports that she does not drink alcohol or use illicit drugs.  REVIEW OF SYSTEMS:  Other than that discussed above is noncontributory.  PHYSICAL EXAMINATION: ECOG PERFORMANCE STATUS: 1 - Symptomatic but completely ambulatory  There were no vitals taken for this visit.  GENERAL:alert, no distress and comfortable SKIN: skin color, texture, turgor are normal, no rashes or significant lesions EYES: PERLA; Conjunctiva are pink and non-injected, sclera clear SINUSES: No redness or tenderness over maxillary or ethmoid sinuses OROPHARYNX:no exudate, no erythema on lips, buccal mucosa, or tongue. NECK: supple, thyroid normal size, non-tender, without nodularity. No masses CHEST: Increased AP diameter with no breast masses. LYMPH:  no palpable lymphadenopathy in the cervical, axillary or inguinal LUNGS: clear to auscultation and percussion with normal breathing effort HEART: regular rate & rhythm and no murmurs. ABDOMEN:abdomen soft, non-tender and normal bowel sounds MUSCULOSKELETAL:no cyanosis of digits and no clubbing. Range of motion normal.  NEURO: alert & oriented x 3 with fluent speech, no focal motor/sensory deficits   LABORATORY DATA: Appointment on 08/15/2013  Component Date Value Ref Range Status  . WBC 08/15/2013 8.8  4.0 - 10.5 K/uL Final  . RBC 08/15/2013 4.07  3.87 - 5.11 MIL/uL Final  . Hemoglobin 08/15/2013 11.8* 12.0 - 15.0 g/dL Final  . HCT 08/15/2013 37.3  36.0 - 46.0 % Final  . MCV 08/15/2013 91.6  78.0 - 100.0 fL Final  . MCH 08/15/2013 29.0  26.0 - 34.0 pg Final  . MCHC 08/15/2013 31.6  30.0 - 36.0 g/dL Final  . RDW 08/15/2013 15.1  11.5 - 15.5 % Final  . Platelets 08/15/2013 176  150 - 400 K/uL Final  . Neutrophils Relative % 08/15/2013 79* 43 - 77 % Final  . Neutro Abs 08/15/2013 7.0  1.7 - 7.7 K/uL Final  . Lymphocytes Relative 08/15/2013 14  12 - 46 % Final  . Lymphs Abs 08/15/2013 1.2  0.7 - 4.0 K/uL Final  .  Monocytes Relative 08/15/2013 6  3 - 12 % Final  . Monocytes Absolute 08/15/2013 0.5  0.1 - 1.0 K/uL Final  . Eosinophils Relative 08/15/2013 1  0 - 5 % Final  . Eosinophils Absolute 08/15/2013 0.1  0.0 - 0.7 K/uL Final  . Basophils Relative 08/15/2013 0  0 - 1 % Final  . Basophils Absolute 08/15/2013 0.0  0.0 - 0.1 K/uL Final  . Retic Ct Pct 08/15/2013 1.9  0.4 - 3.1 % Final  . RBC. 08/15/2013 4.07  3.87 - 5.11 MIL/uL Final  . Retic Count, Manual 08/15/2013 77.3  19.0 - 186.0 K/uL Final  . Sodium 08/15/2013 143  137 - 147 mEq/L Final  . Potassium 08/15/2013 3.7  3.7 - 5.3 mEq/L Final  . Chloride 08/15/2013 102  96 - 112 mEq/L Final  . CO2 08/15/2013 31  19 - 32 mEq/L Final  . Glucose, Bld 08/15/2013 89  70 - 99 mg/dL Final  . BUN 08/15/2013 13  6 - 23 mg/dL Final  . Creatinine, Ser 08/15/2013 1.06  0.50 - 1.10 mg/dL Final  . Calcium 08/15/2013 9.0  8.4 - 10.5 mg/dL Final  . Total Protein 08/15/2013 6.7  6.0 - 8.3 g/dL Final  . Albumin 08/15/2013 3.4* 3.5 - 5.2 g/dL Final  . AST  08/15/2013 18  0 - 37 U/L Final  . ALT 08/15/2013 16  0 - 35 U/L Final  . Alkaline Phosphatase 08/15/2013 83  39 - 117 U/L Final  . Total Bilirubin 08/15/2013 0.4  0.3 - 1.2 mg/dL Final  . GFR calc non Af Amer 08/15/2013 57* >90 mL/min Final  . GFR calc Af Amer 08/15/2013 66* >90 mL/min Final   Comment: (NOTE)                          The eGFR has been calculated using the CKD EPI equation.                          This calculation has not been validated in all clinical situations.                          eGFR's persistently <90 mL/min signify possible Chronic Kidney                          Disease.  . Anion gap 08/15/2013 10  5 - 15 Final  . LDH 08/15/2013 234  94 - 250 U/L Final    PATHOLOGY: No new pathology.  Urinalysis    Component Value Date/Time   COLORURINE YELLOW 08/03/2011 1743   APPEARANCEUR CLEAR 08/03/2011 1743   LABSPEC 1.025 08/03/2011 1743   PHURINE 6.0 08/03/2011 1743   GLUCOSEU  NEGATIVE 08/03/2011 1743   HGBUR SMALL* 08/03/2011 1743   BILIRUBINUR NEGATIVE 08/03/2011 1743   KETONESUR NEGATIVE 08/03/2011 1743   PROTEINUR NEGATIVE 08/03/2011 1743   UROBILINOGEN 0.2 08/03/2011 1743   NITRITE NEGATIVE 08/03/2011 1743   LEUKOCYTESUR NEGATIVE 08/03/2011 1743    RADIOGRAPHIC STUDIES: No results found.  ASSESSMENT:  1. Diffuse large B-cell lymphoma, no evidence of disease. Stage IV B. status post R. CHOP x4 cycles (08/14/2009- 10/23/2009) along with intrathecal chemotherapy during cycles 3 and 4 with methotrexate, 12 mg and SoluCortef 50 mg. Her chemotherapy had to be stopped due to severe Pseudomonas sepsis after cycles 2 and cycles 4. These episodes were life-threatening.  2. IgG deficiency, 40 g of IVIG ever 4 weeks to prevent recurrent infections.  3. Peripheral neuropathy, chemotherapy-induced.  4. Recurrent sinus infections.  5. Allergic rhinitis and asthma, on treatment.  6. Sarcoidosis 7. Urinary frequency, possible urinary tract infection.    PLAN:  1. Urinalysis with urine culture. Continue using Azo over-the-counter product in the meantime. 2. IVIG 40 g today. 3. Advair inhaler 500/50 once daily 4. Followup one month with CBC, immunofixation, chem profile, LDH, beta-2 microglobulin, ACE level.   All questions were answered. The patient knows to call the clinic with any problems, questions or concerns. We can certainly see the patient much sooner if necessary.   I spent 25 minutes counseling the patient face to face. The total time spent in the appointment was 30 minutes.    Doroteo Bradford, MD 08/15/2013 11:02 AM  DISCLAIMER:  This note was dictated with voice recognition software.  Similar sounding words can inadvertently be transcribed inaccurately and may not be corrected upon review.

## 2013-08-15 NOTE — Patient Instructions (Signed)
[place] Discharge Instructions  RECOMMENDATIONS MADE BY THE CONSULTANT AND ANY TEST RESULTS WILL BE SENT TO YOUR REFERRING PHYSICIAN.  EXAM FINDINGS BY THE PHYSICIAN TODAY AND SIGNS OR SYMPTOMS TO REPORT TO CLINIC OR PRIMARY PHYSICIAN: Exam and findings as discussed by Dr.Formanek.  MEDICATIONS PRESCRIBED:  A Reon Hunley prescription for Advair inhaler has been sent to your pharmacy. Use as directed. You may continue the AZO tablets for now. If your urine test comes back positive for any infection, we will call you with the information and send in an RX for an antibiotic.  INSTRUCTIONS/FOLLOW-UP: Continue maintenance IVIG as ordered. MD appointment with next infusion. Report any issues/concerns to clinic as needed prior to appointment.  Thank you for choosing [facility] to provide your oncology and hematology care.  To afford each patient quality time with our providers, please arrive at least 15 minutes before your scheduled appointment time.  With your help, our goal is to use those 15 minutes to complete the necessary work-up to ensure our physicians have the information they need to help with your evaluation and healthcare recommendations.    Effective January 1st, 2014, we ask that you re-schedule your appointment with our physicians should you arrive 10 or more minutes late for your appointment.  We strive to give you quality time with our providers, and arriving late affects you and other patients whose appointments are after yours.    Again, thank you for choosing [facility].  Our hope is that these requests will decrease the amount of time that you wait before being seen by our physicians.       _____________________________________________________________  Should you have questions after your visit to [facility], please contact our office at [phone] between the hours of 8:30 a.m. and 4:30 p.m.  Voicemails left after  4:30 p.m. will not be returned until the following business day.  For prescription refill requests, have your pharmacy contact our office with your prescription refill request.    _______________________________________________________________  We hope that we have given you very good care.  You may receive a patient satisfaction survey in the mail, please complete it and return it as soon as possible.  We value your feedback!  _______________________________________________________________  Have you asked about our STAR program?  STAR stands for Survivorship Training and Rehabilitation, and this is a nationally recognized cancer care program that focuses on survivorship and rehabilitation.  Cancer and cancer treatments may cause problems, such as, pain, making you feel tired and keeping you from doing the things that you need or want to do. Cancer rehabilitation can help. Our goal is to reduce these troubling effects and help you have the best quality of life possible.  You may receive a survey from a nurse that asks questions about your current state of health.  Based on the survey results, all eligible patients will be referred to the [facility] program for an evaluation so we can better serve you!  A frequently asked questions sheet is available upon request.

## 2013-08-15 NOTE — Progress Notes (Signed)
Patient reports she took tylenol at home prior to today's infusion. Tolerated infusion well.

## 2013-08-16 LAB — IGG, IGA, IGM
IGA: 65 mg/dL — AB (ref 69–380)
IGG (IMMUNOGLOBIN G), SERUM: 706 mg/dL (ref 690–1700)
IgM, Serum: 66 mg/dL (ref 52–322)

## 2013-08-19 LAB — IMMUNOFIXATION ELECTROPHORESIS
IGG (IMMUNOGLOBIN G), SERUM: 727 mg/dL (ref 690–1700)
IGM, SERUM: 65 mg/dL (ref 52–322)
IgA: 66 mg/dL — ABNORMAL LOW (ref 69–380)
Total Protein ELP: 6.1 g/dL (ref 6.0–8.3)

## 2013-08-19 LAB — BETA 2 MICROGLOBULIN, SERUM: Beta-2 Microglobulin: 2.73 mg/L — ABNORMAL HIGH (ref <=2.51)

## 2013-08-28 IMAGING — MG MM DIGITAL SCREENING
5 series · 5 of 5 positions shown · non-contrast
Comparison: 07/04/2008

CLINICAL DATA: Screening.

DIGITAL BILATERAL SCREENING MAMMOGRAM WITH CAD

[L CC]
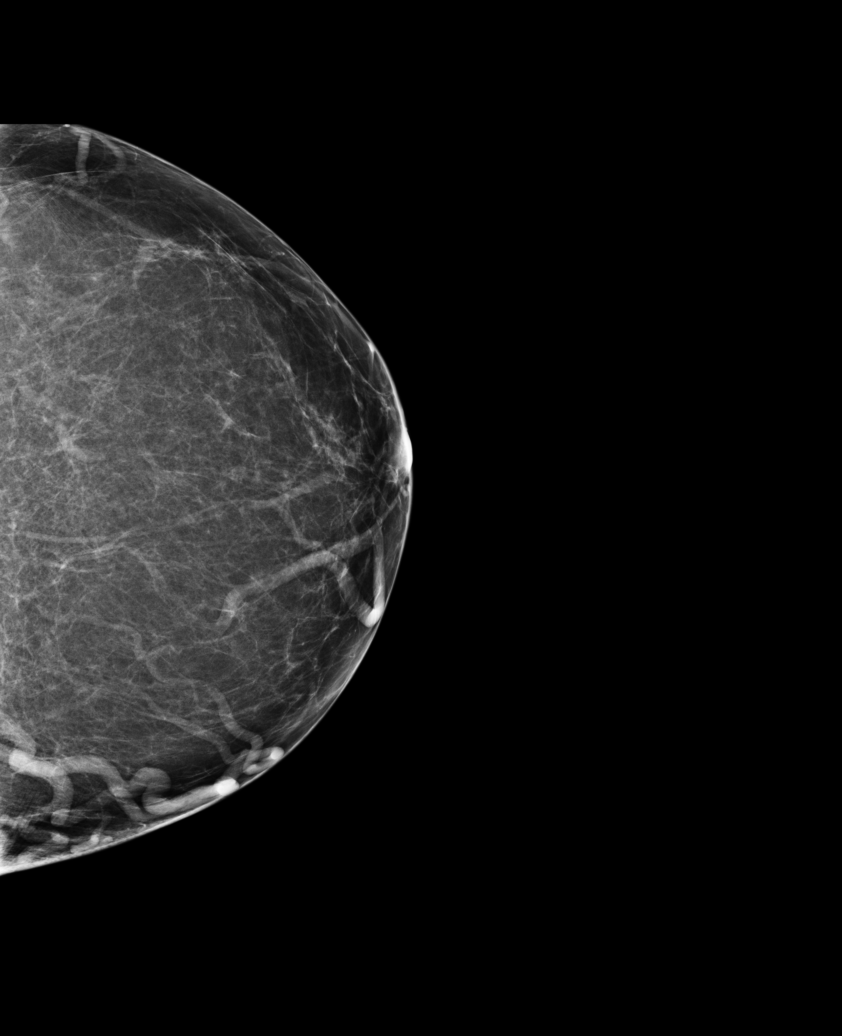

[L MLO]
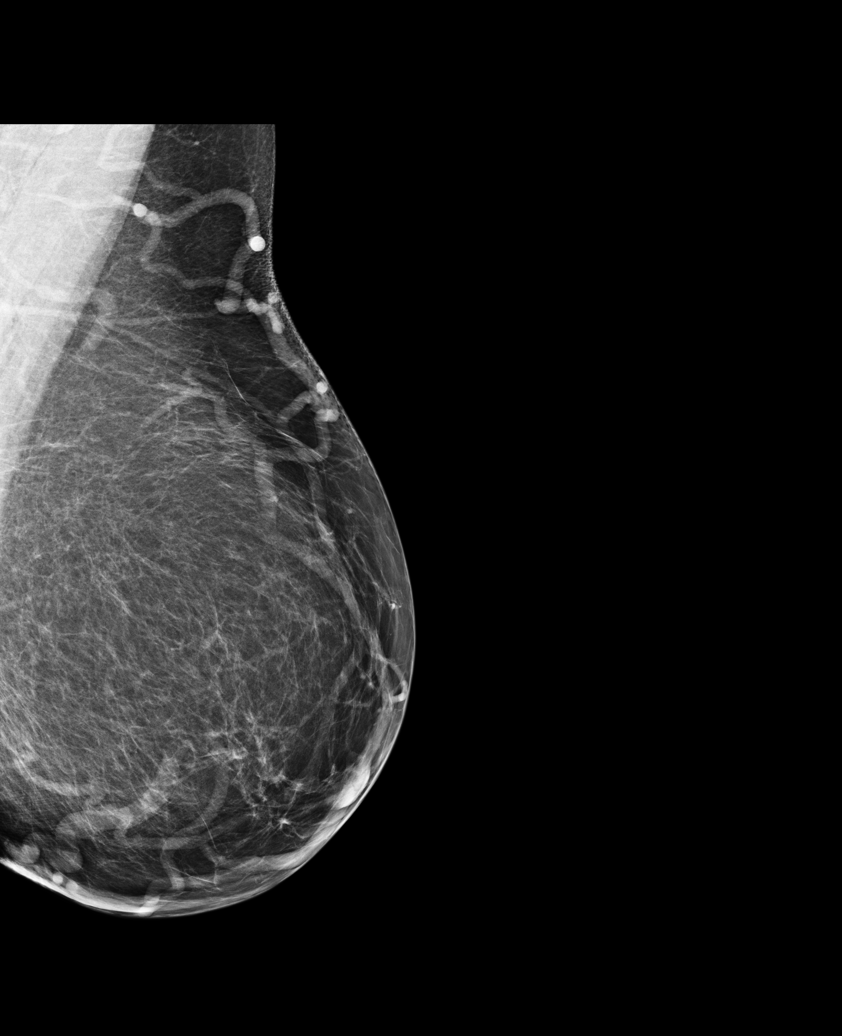

[R CC]
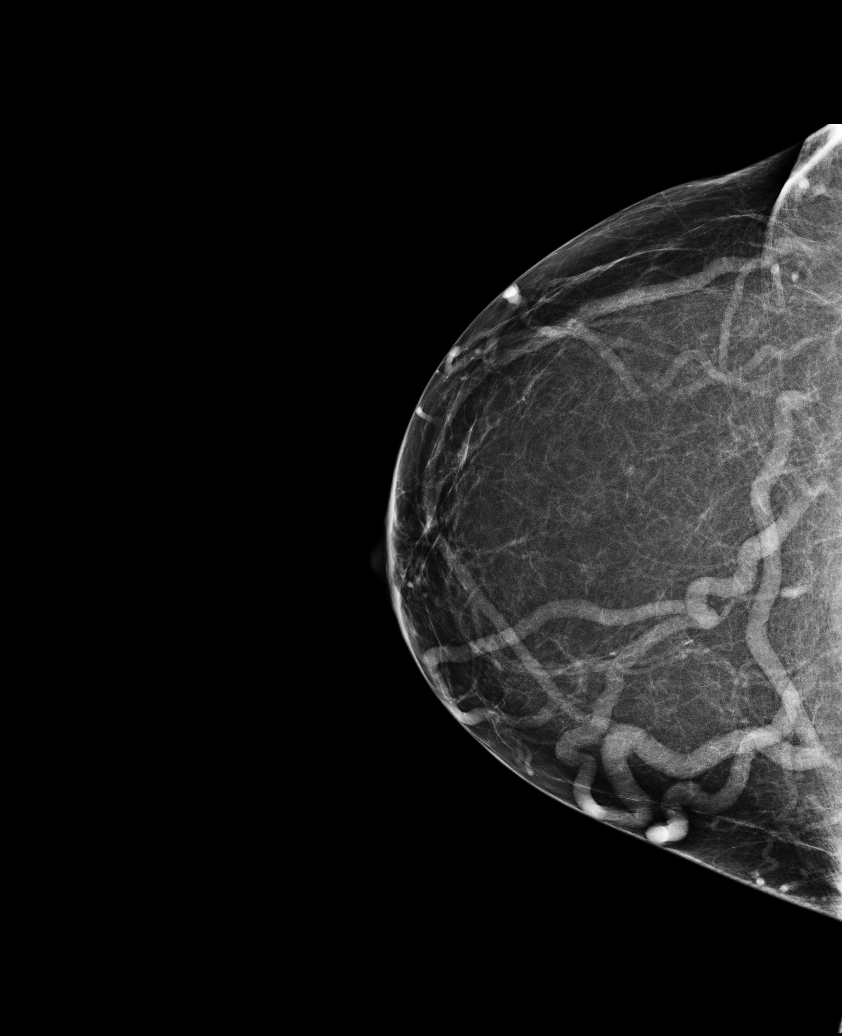

[R MLO (1 of 2)]
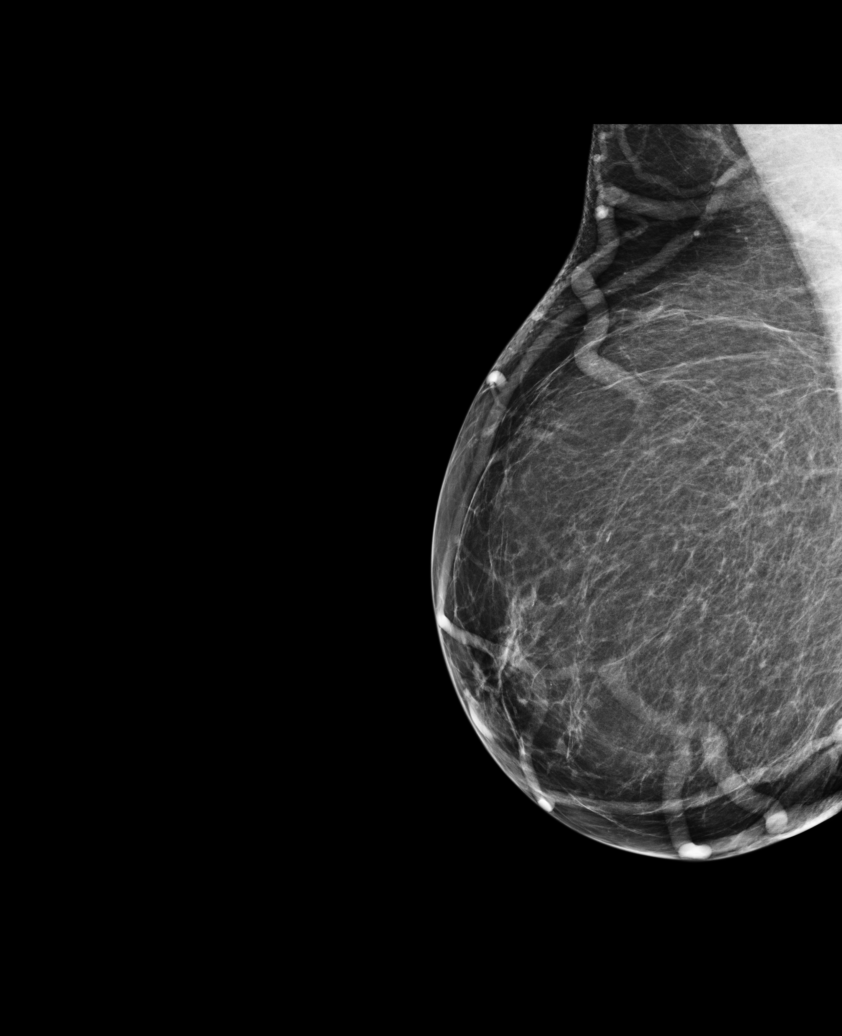

[R MLO (2 of 2)]
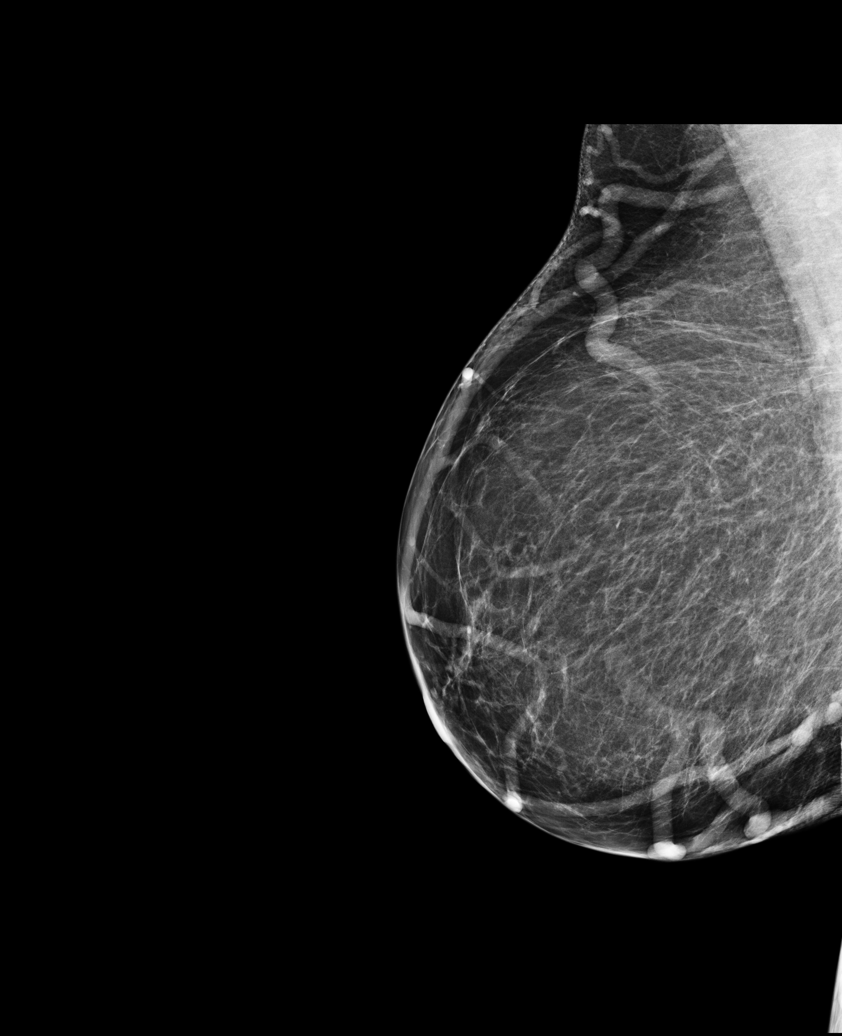

[5 of 5 positions shown; findings below may reference images not displayed]

FINDINGS: ACR Breast Density Category 1: The breast tissue is almost entirely
fatty.

There is no suspicious dominant mass, architectural distortion, or
calcification to suggest malignancy.

Images were processed with CAD.
IMPRESSION: No mammographic evidence of malignancy.

A result letter of this screening mammogram will be mailed directly
to the patient.

RECOMMENDATION:
Screening mammogram in one year. (Code:5F-I-V0X)

BI-RADS CATEGORY 1:  Negative.

## 2013-09-05 ENCOUNTER — Ambulatory Visit (HOSPITAL_COMMUNITY): Payer: Medicare Other

## 2013-09-05 ENCOUNTER — Other Ambulatory Visit (HOSPITAL_COMMUNITY): Payer: Medicare Other

## 2013-09-08 IMAGING — PT NM PET TUM IMG RESTAG (PS) SKULL BASE T - THIGH
1 of 5 series · 1 of 25 positions shown · non-contrast
Comparison: 11/04/2010

CLINICAL DATA: Subsequent treatment strategy for lymphoma.

NUCLEAR MEDICINE PET SKULL BASE TO THIGH
Fasting Blood Glucose:  11/04/2010
TECHNIQUE: 21.5 mCi F-18 FDG was injected intravenously. CT data
was obtained and used for attenuation correction and anatomic
localization only.  (This was not acquired as a diagnostic CT
examination.) Additional exam technical data entered on
technologist worksheet.

[Series 2: ct images · axial · 3.8mm · 0.98mm/px · 1 of 267 slices shown]
[im 267/267  brain]
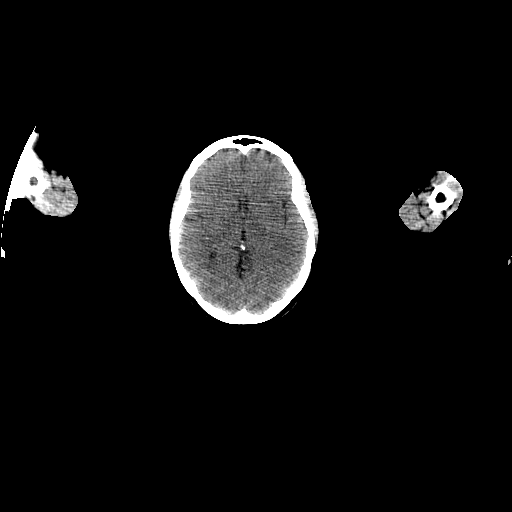

[1 of 25 positions shown; findings below may reference images not displayed]

FINDINGS: Neck: Mucosal thickening is noted involving the maxillary sinuses
and sphenoid sinus.No hypermetabolic lymph nodes in the neck.

Chest:  Pulmonary nodule within the right upper lobe measures
cm, image 79/series 2.  The SUV max associated this nodule is equal
4.4.  Asymmetric increased uptake within the right infrahilar
region is identified, image 96.  This has an SUV max equal to 4.6.
Scattered small nodular densities are noted in the remaining
portions of the lungs.  Low level FDG uptake is associated these
nodules.  For example, in the left upper lobe there is a 0.8 cm
nodule, image 86.  The SUV max associated this nodule is equal to
2.0.

Abdomen/Pelvis:  No abnormal hypermetabolic activity within the
liver, pancreas, adrenal glands, or spleen.  No hypermetabolic
lymph nodes in the abdomen or pelvis.

Skeleton:  No focal hypermetabolic activity to suggest skeletal
metastasis.
IMPRESSION: 1.  1.4 cm nodule within the right upper lobe exhibits malignant
range FDG uptake.  This is worrisome for bronchogenic carcinoma.
Surgical consultation and/or biopsy is recommended.
2.  Other smaller nodules scattered throughout both lungs have a
nonspecific appearance and may be the sequela of atypical infection
or inflammation. Pulmonary metastasis is not excluded.  Consider
further evaluation with diagnostic CT of the chest.

3. Nonspecific asymmetric increased uptake within the right
infrahilar region is noted.  This exhibits malignant range FDG
uptake.
4.  There are no specific features identified to suggest residual
or recurrence of patient's lymphoma.
5.  Chronic appearing sinus inflammation

## 2013-09-12 ENCOUNTER — Encounter (HOSPITAL_COMMUNITY): Payer: Medicare Other

## 2013-09-12 ENCOUNTER — Encounter (HOSPITAL_COMMUNITY): Payer: Medicare Other | Attending: Hematology and Oncology

## 2013-09-12 ENCOUNTER — Encounter (HOSPITAL_BASED_OUTPATIENT_CLINIC_OR_DEPARTMENT_OTHER): Payer: Medicare Other

## 2013-09-12 VITALS — BP 154/82 | HR 51 | Temp 97.7°F | Resp 18 | Wt 177.2 lb

## 2013-09-12 DIAGNOSIS — D801 Nonfamilial hypogammaglobulinemia: Secondary | ICD-10-CM | POA: Diagnosis present

## 2013-09-12 DIAGNOSIS — C8589 Other specified types of non-Hodgkin lymphoma, extranodal and solid organ sites: Secondary | ICD-10-CM | POA: Diagnosis present

## 2013-09-12 DIAGNOSIS — C8590 Non-Hodgkin lymphoma, unspecified, unspecified site: Secondary | ICD-10-CM

## 2013-09-12 DIAGNOSIS — D869 Sarcoidosis, unspecified: Secondary | ICD-10-CM | POA: Insufficient documentation

## 2013-09-12 LAB — COMPREHENSIVE METABOLIC PANEL
ALBUMIN: 3.4 g/dL — AB (ref 3.5–5.2)
ALK PHOS: 90 U/L (ref 39–117)
ALT: 13 U/L (ref 0–35)
AST: 17 U/L (ref 0–37)
Anion gap: 12 (ref 5–15)
BUN: 13 mg/dL (ref 6–23)
CHLORIDE: 103 meq/L (ref 96–112)
CO2: 29 mEq/L (ref 19–32)
Calcium: 8.9 mg/dL (ref 8.4–10.5)
Creatinine, Ser: 1.06 mg/dL (ref 0.50–1.10)
GFR calc Af Amer: 66 mL/min — ABNORMAL LOW (ref >90)
GFR calc non Af Amer: 57 mL/min — ABNORMAL LOW (ref >90)
Glucose, Bld: 89 mg/dL (ref 70–99)
Potassium: 3.6 mEq/L — ABNORMAL LOW (ref 3.7–5.3)
Sodium: 144 mEq/L (ref 137–147)
Total Bilirubin: 0.3 mg/dL (ref 0.3–1.2)
Total Protein: 6.6 g/dL (ref 6.0–8.3)

## 2013-09-12 LAB — CBC WITH DIFFERENTIAL/PLATELET
BASOS ABS: 0 10*3/uL (ref 0.0–0.1)
BASOS PCT: 0 % (ref 0–1)
Eosinophils Absolute: 0.1 10*3/uL (ref 0.0–0.7)
Eosinophils Relative: 2 % (ref 0–5)
HCT: 37.5 % (ref 36.0–46.0)
HEMOGLOBIN: 12 g/dL (ref 12.0–15.0)
Lymphocytes Relative: 18 % (ref 12–46)
Lymphs Abs: 1.4 10*3/uL (ref 0.7–4.0)
MCH: 28.8 pg (ref 26.0–34.0)
MCHC: 32 g/dL (ref 30.0–36.0)
MCV: 90.1 fL (ref 78.0–100.0)
MONOS PCT: 6 % (ref 3–12)
Monocytes Absolute: 0.5 10*3/uL (ref 0.1–1.0)
NEUTROS ABS: 5.6 10*3/uL (ref 1.7–7.7)
NEUTROS PCT: 74 % (ref 43–77)
Platelets: 187 10*3/uL (ref 150–400)
RBC: 4.16 MIL/uL (ref 3.87–5.11)
RDW: 14.6 % (ref 11.5–15.5)
WBC: 7.6 10*3/uL (ref 4.0–10.5)

## 2013-09-12 LAB — LACTATE DEHYDROGENASE: LDH: 215 U/L (ref 94–250)

## 2013-09-12 LAB — ANGIOTENSIN CONVERTING ENZYME: ANGIOTENSIN-CONVERTING ENZYME: 40 U/L (ref 8–52)

## 2013-09-12 MED ORDER — IMMUNE GLOBULIN (HUMAN) 20 GM/200ML IV SOLN
40.0000 g | Freq: Once | INTRAVENOUS | Status: AC
  Administered 2013-09-12: 40 g via INTRAVENOUS
  Filled 2013-09-12: qty 400

## 2013-09-12 MED ORDER — SODIUM CHLORIDE 0.9 % IJ SOLN
10.0000 mL | Freq: Once | INTRAMUSCULAR | Status: AC
Start: 2013-09-12 — End: 2013-09-12
  Administered 2013-09-12: 10 mL via INTRAVENOUS

## 2013-09-12 MED ORDER — DEXTROSE 5 % IV SOLN
INTRAVENOUS | Status: DC
Start: 2013-09-12 — End: 2013-09-12
  Administered 2013-09-12: 09:00:00 via INTRAVENOUS

## 2013-09-12 NOTE — Progress Notes (Signed)
Patient reports she took tylenol at home prior to today's appointment. Tolerated infusion well.

## 2013-09-12 NOTE — Patient Instructions (Signed)
[place] Discharge Instructions  RECOMMENDATIONS MADE BY THE CONSULTANT AND ANY TEST RESULTS WILL BE SENT TO YOUR REFERRING PHYSICIAN.  EXAM FINDINGS BY THE PHYSICIAN TODAY AND SIGNS OR SYMPTOMS TO REPORT TO CLINIC OR PRIMARY PHYSICIAN: Exam and findings as discussed by Dr.Formanek.  MEDICATIONS PRESCRIBED:  IVIG today. Continue IVIG infusion every 4 weeks.  INSTRUCTIONS/FOLLOW-UP: Return to clinic as scheduled. Report any issues/concerns as needed.  Thank you for choosing [place] to provide your oncology and hematology care.  To afford each patient quality time with our providers, please arrive at least 15 minutes before your scheduled appointment time.  With your help, our goal is to use those 15 minutes to complete the necessary work-up to ensure our physicians have the information they need to help with your evaluation and healthcare recommendations.    Effective January 1st, 2014, we ask that you re-schedule your appointment with our physicians should you arrive 10 or more minutes late for your appointment.  We strive to give you quality time with our providers, and arriving late affects you and other patients whose appointments are after yours.    Again, thank you for choosing [facility].  Our hope is that these requests will decrease the amount of time that you wait before being seen by our physicians.       _____________________________________________________________  Should you have questions after your visit to [facility], please contact our office at [phone] between the hours of 8:30 a.m. and 4:30 p.m.  Voicemails left after 4:30 p.m. will not be returned until the following business day.  For prescription refill requests, have your pharmacy contact our office with your prescription refill request.    _______________________________________________________________  We hope that we have given you very  good care.  You may receive a patient satisfaction survey in the mail, please complete it and return it as soon as possible.  We value your feedback!  _______________________________________________________________  Have you asked about our STAR program?  STAR stands for Survivorship Training and Rehabilitation, and this is a nationally recognized cancer care program that focuses on survivorship and rehabilitation.  Cancer and cancer treatments may cause problems, such as, pain, making you feel tired and keeping you from doing the things that you need or want to do. Cancer rehabilitation can help. Our goal is to reduce these troubling effects and help you have the best quality of life possible.  You may receive a survey from a nurse that asks questions about your current state of health.  Based on the survey results, all eligible patients will be referred to the [facility] program for an evaluation so we can better serve you!  A frequently asked questions sheet is available upon request.

## 2013-09-12 NOTE — Progress Notes (Signed)
Copy received today and sent for scanning

## 2013-09-12 NOTE — Progress Notes (Signed)
[place]  OFFICE PROGRESS NOTE  Leonides Grills, MD [place] [address]  DIAGNOSIS: Hypogammaglobulinaemia, unspecified - Plan: Immunofixation electrophoresis, Immunofixation electrophoresis  NHL (non-Hodgkin's lymphoma) - Plan: CBC with Differential, Comprehensive metabolic panel, Beta 2 microglobulin, serum, Lactate dehydrogenase, CBC with Differential, Comprehensive metabolic panel, Lactate dehydrogenase, Beta 2 microglobuline, serum, Reticulocytes  Sarcoidosis - Plan: Angiotensin converting enzyme, Angiotensin converting enzyme  No chief complaint on file.   CURRENT THERAPY: Intravenous gamma globulin 40 g monthly for IgG deficiency. 2  INTERVAL HISTORY: [name] 58 y.o. female returns for followup of diffuse large B-cell lymphoma status post R. CHOP x4 cycles (08/14/2009- 10/23/2009) along with intrathecal chemotherapy during cycles 3 and 4 with methotrexate, 12 mg and SoluCortef 50 mg. Her chemotherapy had to be stopped due to severe Pseudomonas sepsis after cycles 2 and cycles 4. These episodes were life-threatening.  AND  IgG deficiency, 40 g of IVIG ever 4 weeks to prevent recurrent infections. She is now using Advair inhaler twice a day and has had much less cough and less need for breakthrough inhaler use. She denies any sore throat, earache, diarrhea, constipation, lower extremity swelling or redness, PND, orthopnea, palpitations, headache, nasal drip, or seizures.    MEDICAL HISTORY: Past Medical History  Diagnosis Date  . Non Hodgkin's lymphoma   . Allergic rhinitis   . GERD (gastroesophageal reflux disease)   . Asthma   . Chronic sinusitis   . Respiratory failure   . Cavitary lung disease   . PONV (postoperative nausea and vomiting)   . Endotracheally intubated   . Myocardial infarction 2011  . Anxiety   . Chronic kidney disease   . Anemia   . 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    ALLERGIES:  is allergic to meperidine hcl and montelukast sodium.  MEDICATIONS: has a current medication list which includes the following prescription(s): acetaminophen, acyclovir, albuterol, albuterol, citalopram, fluticasone, fluticasone-salmeterol, gabapentin, mometasone-formoterol, omeprazole, prednisone, simvastatin, vitamin c, warfarin, and zafirlukast, and the following Facility-Administered Medications: dextrose and immune globulin  10%.  SURGICAL HISTORY:  Past Surgical History  Procedure Laterality Date  . Vesicovaginal fistula closure w/ tah    . Nasal sinus surgery    . Neck surgery      x 2   . Portacath placement  7/11    Removed 6/12  . Basal cell carcinoma excision  03/2011    scalp  . Port-a-cath removal    . Peripherally inserted central catheter insertion    . Picc removal    . Tracheostomy    . Abdominal hysterectomy    . Lung lobectomy      right  . Breast surgery Right   . Colonoscopy N/A 11/14/2012    Procedure: COLONOSCOPY;  Surgeon: Rogene Houston, MD;  Location: [facility];  Service: Endoscopy;  Laterality: N/A;  830    FAMILY HISTORY: family history includes Allergies in her father; Asthma in her father; Diabetes in her brother; Emphysema in her mother; Leukemia in her maternal grandmother; Stroke in her mother.  SOCIAL HISTORY:  reports that she has never smoked. She has never used smokeless tobacco. She reports that she does not drink alcohol or use illicit drugs.  REVIEW OF SYSTEMS:  Other than that discussed above is noncontributory.  PHYSICAL EXAMINATION: ECOG PERFORMANCE STATUS: 1 - Symptomatic but completely ambulatory  There  were no vitals taken for this visit.  GENERAL:alert, no distress and comfortable SKIN: skin color, texture, turgor are normal, no rashes or significant lesions EYES: PERLA; Conjunctiva are pink and non-injected, sclera clear SINUSES: No redness or tenderness over maxillary or ethmoid sinuses OROPHARYNX:no exudate, no erythema on lips, buccal mucosa, or tongue. NECK: supple, thyroid normal size, non-tender, without nodularity. No masses CHEST: Increased AP diameter with scattered rhonchi at the bases. LYMPH:  no palpable lymphadenopathy in the cervical, axillary or inguinal LUNGS: clear to auscultation and percussion with normal breathing effort HEART: regular rate & rhythm and no murmurs. ABDOMEN:abdomen soft, non-tender and normal bowel sounds. No organomegaly, ascites, or CVA tenderness. MUSCULOSKELETAL:no cyanosis of digits and no clubbing. Range of motion normal.  NEURO: alert & oriented x 3 with fluent speech, no focal motor/sensory deficits   LABORATORY DATA: Appointment on 09/12/2013  Component Date Value Ref Range Status  . WBC 09/12/2013 7.6  4.0 - 10.5 K/uL Final  . RBC 09/12/2013 4.16  3.87 - 5.11 MIL/uL Final  . Hemoglobin 09/12/2013 12.0  12.0 - 15.0 g/dL Final  . HCT 09/12/2013 37.5  36.0 - 46.0 % Final  . MCV 09/12/2013 90.1  78.0 - 100.0 fL Final  . MCH 09/12/2013 28.8  26.0 - 34.0 pg Final  . MCHC 09/12/2013 32.0  30.0 - 36.0 g/dL Final  . RDW 09/12/2013 14.6  11.5 - 15.5 % Final  . Platelets 09/12/2013 187  150 - 400 K/uL Final  . Neutrophils Relative % 09/12/2013 74  43 - 77 % Final  . Neutro Abs 09/12/2013 5.6  1.7 - 7.7 K/uL Final  . Lymphocytes Relative 09/12/2013 18  12 - 46 % Final  . Lymphs Abs 09/12/2013 1.4  0.7 - 4.0 K/uL Final  . Monocytes Relative 09/12/2013 6  3 - 12 % Final  . Monocytes Absolute 09/12/2013 0.5  0.1 - 1.0 K/uL Final  . Eosinophils Relative 09/12/2013 2  0 - 5 % Final  . Eosinophils Absolute 09/12/2013 0.1  0.0 - 0.7 K/uL Final  .  Basophils Relative 09/12/2013 0  0 - 1 % Final  . Basophils Absolute 09/12/2013 0.0  0.0 - 0.1 K/uL Final  Infusion on 08/15/2013  Component Date Value Ref Range Status  . Color, Urine 08/15/2013 YELLOW  YELLOW Final  . APPearance 08/15/2013 CLEAR  CLEAR Final  . Specific Gravity, Urine 08/15/2013 1.010  1.005 - 1.030 Final  . pH 08/15/2013 6.0  5.0 - 8.0 Final  . Glucose, UA 08/15/2013 NEGATIVE  NEGATIVE mg/dL Final  . Hgb urine dipstick 08/15/2013 SMALL* NEGATIVE Final  . Bilirubin Urine 08/15/2013 NEGATIVE  NEGATIVE Final  . Ketones, ur 08/15/2013 NEGATIVE  NEGATIVE mg/dL Final  . Protein, ur 08/15/2013 NEGATIVE  NEGATIVE mg/dL Final  . Urobilinogen, UA 08/15/2013 0.2  0.0 - 1.0 mg/dL Final  . Nitrite 08/15/2013 NEGATIVE  NEGATIVE Final  . Leukocytes, UA 08/15/2013 NEGATIVE  NEGATIVE Final  . RBC / HPF 08/15/2013 0-2  <3 RBC/hpf Final  Appointment on 08/15/2013  Component Date Value Ref Range Status  . WBC 08/15/2013 8.8  4.0 - 10.5 K/uL Final  . RBC 08/15/2013 4.07  3.87 - 5.11 MIL/uL Final  . Hemoglobin 08/15/2013 11.8* 12.0 - 15.0 g/dL Final  . HCT 08/15/2013 37.3  36.0 - 46.0 % Final  . MCV 08/15/2013 91.6  78.0 - 100.0 fL Final  . MCH 08/15/2013 29.0  26.0 - 34.0 pg Final  . MCHC 08/15/2013 31.6  30.0 - 36.0 g/dL Final  . RDW 33/41/6225 15.1  11.5 - 15.5 % Final  . Platelets 08/15/2013 176  150 - 400 K/uL Final  . Neutrophils Relative % 08/15/2013 79* 43 - 77 % Final  . Neutro Abs 08/15/2013 7.0  1.7 - 7.7 K/uL Final  . Lymphocytes Relative 08/15/2013 14  12 - 46 % Final  . Lymphs Abs 08/15/2013 1.2  0.7 - 4.0 K/uL Final  . Monocytes Relative 08/15/2013 6  3 - 12 % Final  . Monocytes Absolute 08/15/2013 0.5  0.1 - 1.0 K/uL Final  . Eosinophils Relative 08/15/2013 1  0 - 5 % Final  . Eosinophils Absolute 08/15/2013 0.1  0.0 - 0.7 K/uL Final  . Basophils Relative 08/15/2013 0  0 - 1 % Final  . Basophils Absolute 08/15/2013 0.0  0.0 - 0.1 K/uL Final  . Retic Ct Pct  08/15/2013 1.9  0.4 - 3.1 % Final  . RBC. 08/15/2013 4.07  3.87 - 5.11 MIL/uL Final  . Retic Count, Manual 08/15/2013 77.3  19.0 - 186.0 K/uL Final  . Sodium 08/15/2013 143  137 - 147 mEq/L Final  . Potassium 08/15/2013 3.7  3.7 - 5.3 mEq/L Final  . Chloride 08/15/2013 102  96 - 112 mEq/L Final  . CO2 08/15/2013 31  19 - 32 mEq/L Final  . Glucose, Bld 08/15/2013 89  70 - 99 mg/dL Final  . BUN 27/61/9593 13  6 - 23 mg/dL Final  . Creatinine, Ser 08/15/2013 1.06  0.50 - 1.10 mg/dL Final  . Calcium 62/76/5587 9.0  8.4 - 10.5 mg/dL Final  . Total Protein 08/15/2013 6.7  6.0 - 8.3 g/dL Final  . Albumin 10/02/4163 3.4* 3.5 - 5.2 g/dL Final  . AST 08/15/6529 18  0 - 37 U/L Final  . ALT 08/15/2013 16  0 - 35 U/L Final  . Alkaline Phosphatase 08/15/2013 83  39 - 117 U/L Final  . Total Bilirubin 08/15/2013 0.4  0.3 - 1.2 mg/dL Final  . GFR calc non Af Amer 08/15/2013 57* >90 mL/min Final  . GFR calc Af Amer 08/15/2013 66* >90 mL/min Final   Comment: (NOTE)                          The eGFR has been calculated using the CKD EPI equation.                          This calculation has not been validated in all clinical situations.                          eGFR's persistently <90 mL/min signify possible Chronic Kidney                          Disease.  . Anion gap 08/15/2013 10  5 - 15 Final  . LDH 08/15/2013 234  94 - 250 U/L Final  . Beta-2 Microglobulin 08/15/2013 2.73* <=2.51 mg/L Final   Performed at [facility]  . Total Protein ELP 08/15/2013 6.1  6.0 - 8.3 g/dL Final  . IgG (Immunoglobin G), Serum 08/15/2013 727  690 - 1700 mg/dL Final  . IgA 13/94/9148 66* 69 - 380 mg/dL Final  . IgM, Serum  08/15/2013 65  52 - 322 mg/dL Final  . Immunofix Electr Int 08/15/2013 (NOTE)   Final   Comment: No monoclonal protein identified.                          Reviewed by Odis Hollingshead, MD, PhD, FCAP (Electronic Signature on                          File)                          Performed  at [facility]  . IgG (Immunoglobin G), Serum 08/15/2013 706  690 - 1700 mg/dL Final  . IgA 08/15/2013 65* 69 - 380 mg/dL Final  . IgM, Serum 08/15/2013 66  52 - 322 mg/dL Final   Performed at [facility]  . Angiotensin-Converting Enzyme 08/15/2013 34  8 - 52 U/L Final   Performed at [place]: No new pathology.  Urinalysis    Component Value Date/Time   COLORURINE YELLOW 08/15/2013 1036   APPEARANCEUR CLEAR 08/15/2013 1036   LABSPEC 1.010 08/15/2013 1036   PHURINE 6.0 08/15/2013 1036   GLUCOSEU NEGATIVE 08/15/2013 1036   HGBUR SMALL* 08/15/2013 1036   BILIRUBINUR NEGATIVE 08/15/2013 1036   KETONESUR NEGATIVE 08/15/2013 1036   PROTEINUR NEGATIVE 08/15/2013 1036   UROBILINOGEN 0.2 08/15/2013 1036   NITRITE NEGATIVE 08/15/2013 1036   LEUKOCYTESUR NEGATIVE 08/15/2013 1036    RADIOGRAPHIC STUDIES: No results found.  ASSESSMENT:  1. Diffuse large B-cell lymphoma, no evidence of disease. Stage IV B. status post R. CHOP x4 cycles (08/14/2009- 10/23/2009) along with intrathecal chemotherapy during cycles 3 and 4 with methotrexate, 12 mg and SoluCortef 50 mg. Her chemotherapy had to be stopped due to severe Pseudomonas sepsis after cycles 2 and cycles 4. These episodes were life-threatening.  2. IgG deficiency, 40 g of IVIG ever 4 weeks to prevent recurrent infections.  3. Peripheral neuropathy, chemotherapy-induced.  4. Recurrent sinus infections.  5. Allergic rhinitis and asthma, on treatment.  6. Sarcoidosis         PLAN:  #1. 40 g IV IgG today. #2. Suggested LifePort be inserted. She had had one on the left side in the past with a lighting complication in conjunction with a blood clot in her leg. I do not see a contraindication to putting 1 and on the right side. #3. Continue Advair inhaler 500/50. #4. Followup in one month with CBC, immunofixation, chem profile, LDH, beta-2 microglobulin, and ACE level.   All questions were answered. The  patient knows to call the clinic with any problems, questions or concerns. We can certainly see the patient much sooner if necessary.   I spent 25 minutes counseling the patient face to face. The total time spent in the appointment was 30 minutes.    Doroteo Bradford, MD 09/12/2013 9:17 AM  DISCLAIMER:  This note was dictated with voice recognition software.  Similar sounding words can inadvertently be transcribed inaccurately and may not be corrected upon review.

## 2013-09-14 IMAGING — CT CT CHEST SUPER D W/O CM
2 of 4 series · 15 of 36 positions shown, 18 images · IV contrast (agent unspecified)
Comparison: PET CT 01/17/2012.

CLINICAL DATA: Right upper lobe nodule.

CT CHEST WITH CONTRAST
TECHNIQUE: Multidetector CT imaging of the chest was performed
using thin slice collimation for electromagnetic bronchoscopy
planning purposes, without intravenous contrast.

[Series 4: mpr coro 3mm · coronal · 0.55mm/px · 3 of 76 slices shown]
[im 16/76  lung]
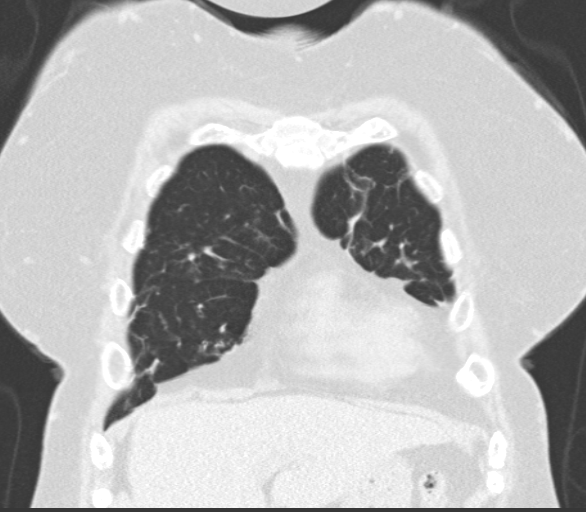
[im 31/76  lung]
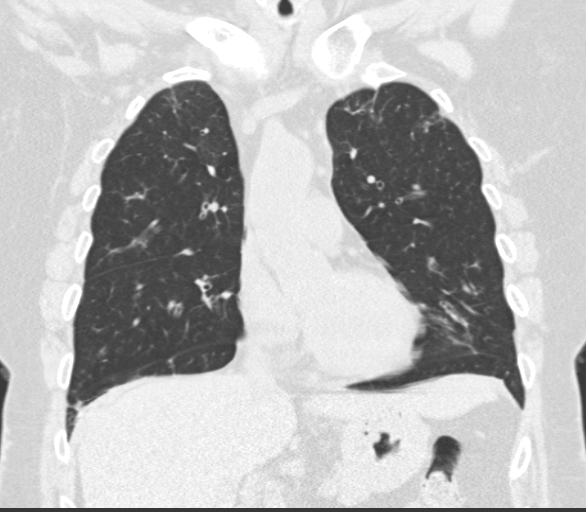
[im 46/76  lung]
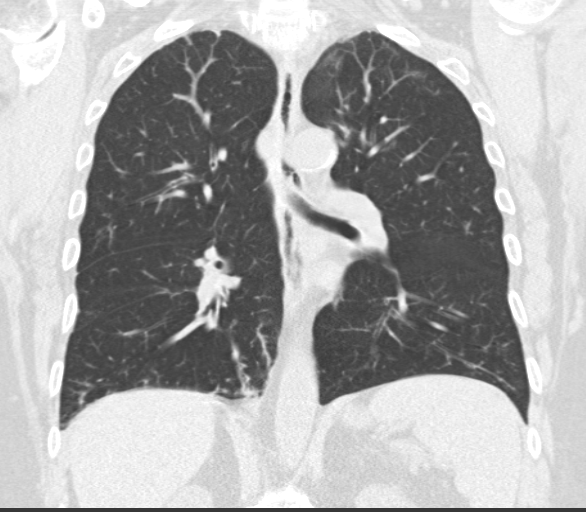

[Series 7: super d do not send to pacs · axial · 0.59mm/px · z∈[-273,-16]mm · 12 of 354 slices shown, 15 images]
[im 16/354  mediastinal]
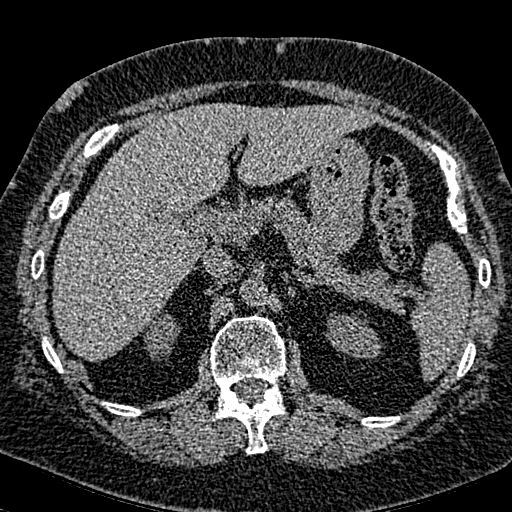
[im 16/354  lung]
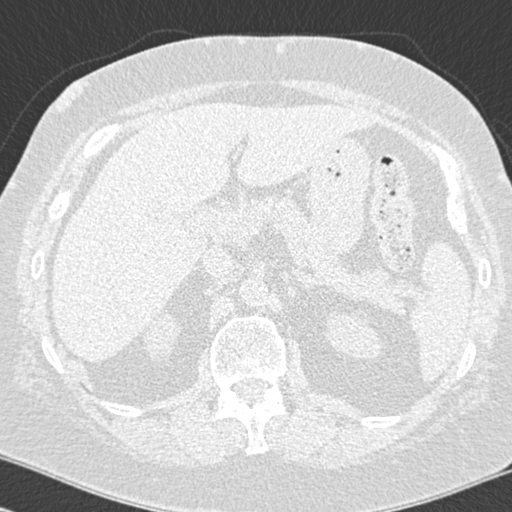
[im 47/354  lung]
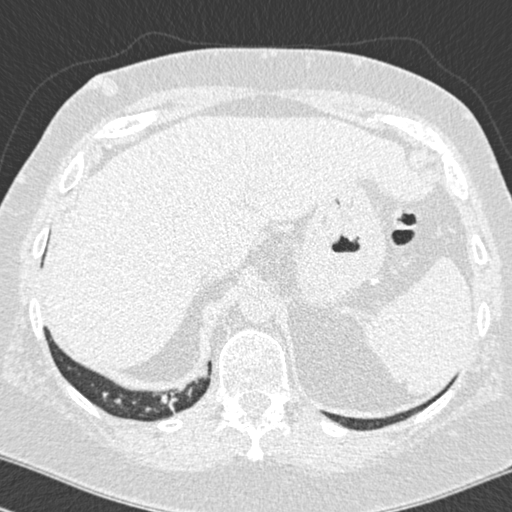
[im 77/354  lung]
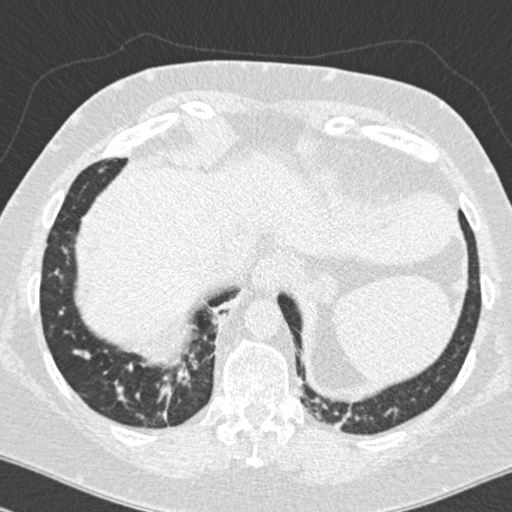
[im 108/354  lung]
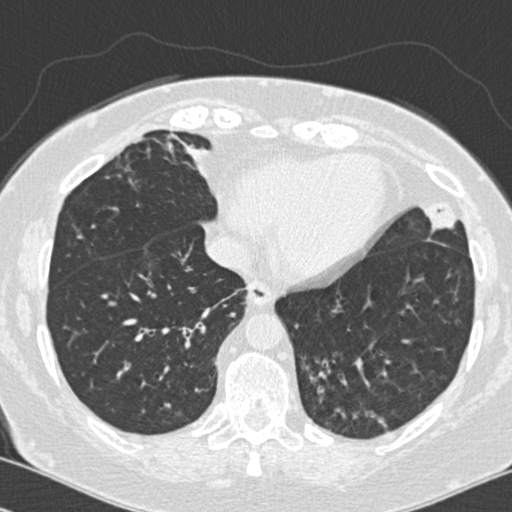
[im 139/354  mediastinal]
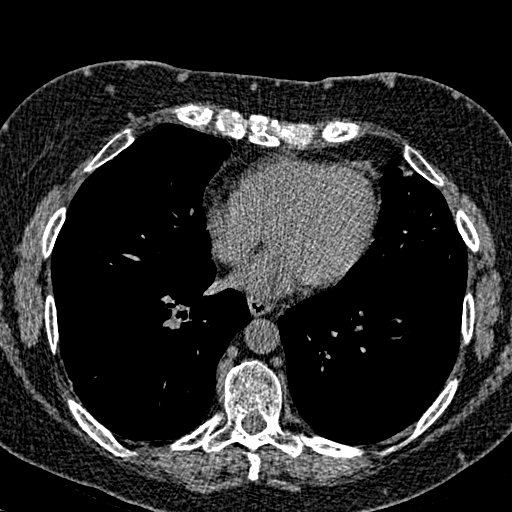
[im 139/354  lung]
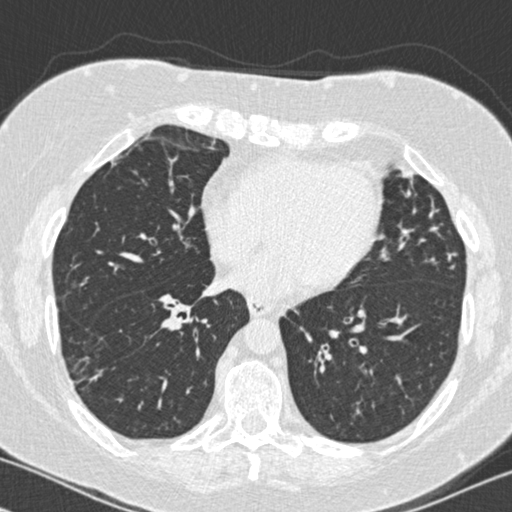
[im 169/354  lung]
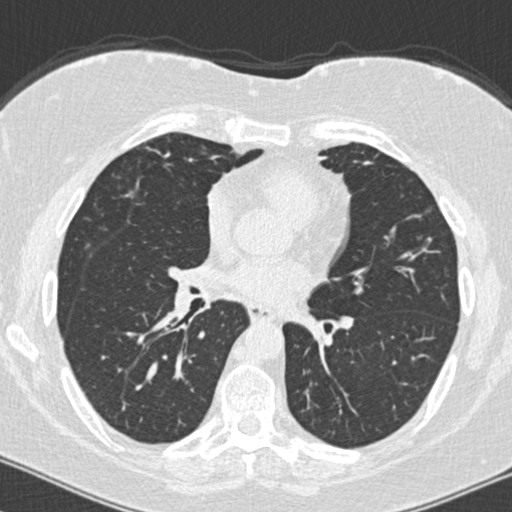
[im 185/354  lung]
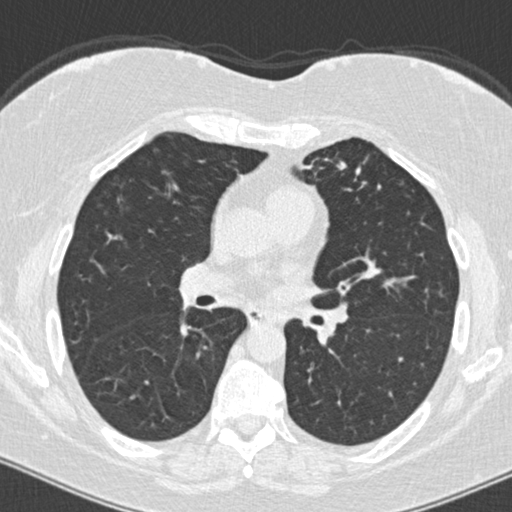
[im 215/354  lung]
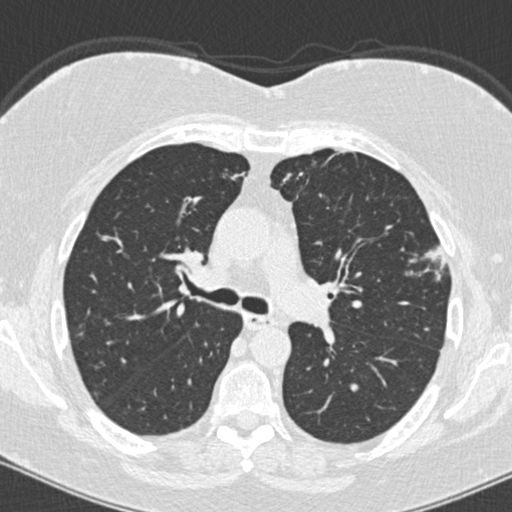
[im 246/354  mediastinal]
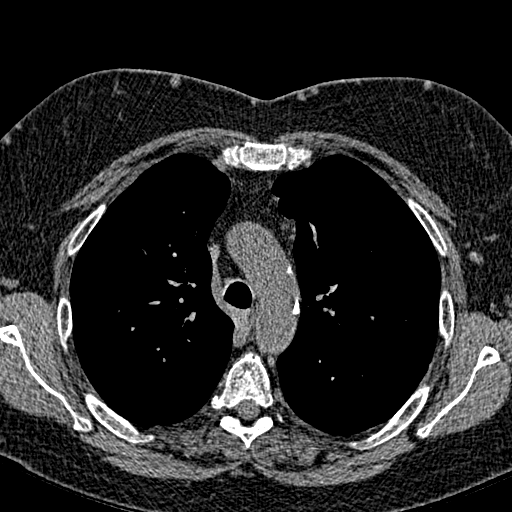
[im 246/354  lung]
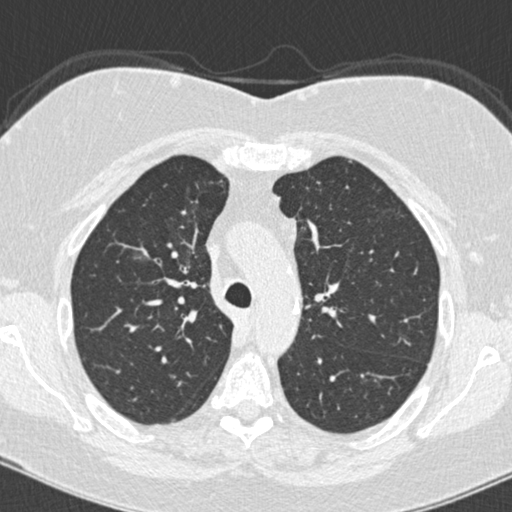
[im 277/354  lung]
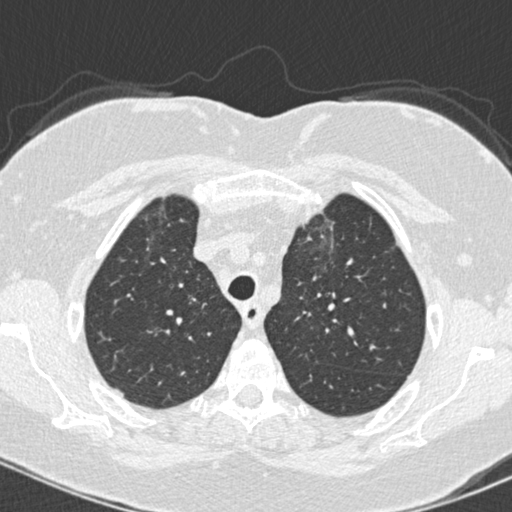
[im 307/354  lung]
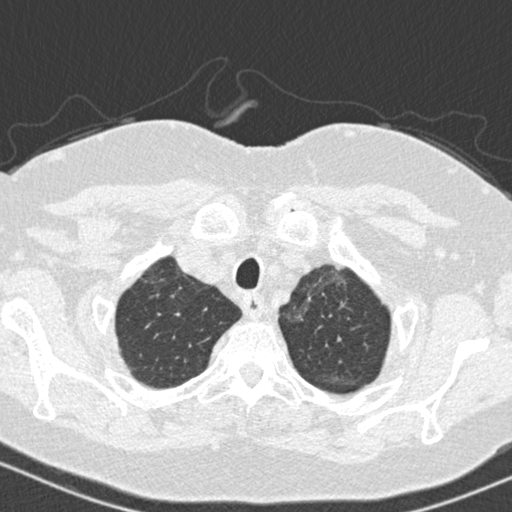
[im 338/354  lung]
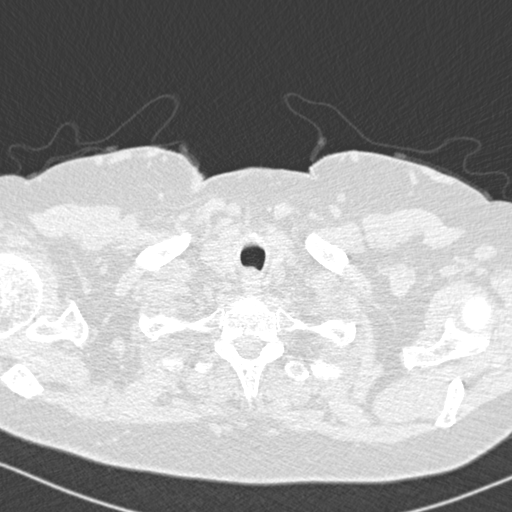

[15 of 36 positions shown; findings below may reference images not displayed]

FINDINGS: Extensive chest wall collaterals are noted suggesting a
central venous occlusion or stenosis.  The SVC is quite small and
may be chronically occluded.  No breast masses, supraclavicular or
axillary adenopathy.  The bony thorax is intact.

The heart is normal in size.  No pericardial effusion.  There are
small scattered mediastinal and hilar lymph nodes but no adenopathy
or mass.  The esophagus is grossly normal.  The aorta is normal in
caliber.  Minimal atherosclerotic calcifications.

Examination of the lung parenchyma demonstrates numerous patchy
ground-glass opacities.  There is also significant peribronchial
thickening mainly in the lower lung zones which is stable.  The
right upper lobe lesions seen on the PET CT demonstrates much less
consolidation and more of a vague ground-glass opacity.
Adenocarcinoma is still a possibility.  Atypical infectious or
inflammatory process is also possible.  No discrete solid pulmonary
mass lesion.  No pulmonary edema or pleural effusion.  The
tracheobronchial tree is unremarkable.  No pleural effusion.

The upper abdomen is unremarkable.
IMPRESSION: 1.  Extensive chest wall collaterals likely due to SVC obstruction
or occlusion.
2.  Multiple bilateral patchy ground-glass opacities likely
reflecting inflammatory or atypical infectious process.  The 1.4-cm
right upper lobe lesion appears less solid and more ground-glass
like.
3.  Persistent bronchitic changes with peribronchial thickening and
scattered tree in bud appearance.
4.  No mediastinal or hilar lymphadenopathy.

## 2013-09-16 LAB — IMMUNOFIXATION ELECTROPHORESIS
IGG (IMMUNOGLOBIN G), SERUM: 808 mg/dL (ref 690–1700)
IGM, SERUM: 69 mg/dL (ref 52–322)
IgA: 65 mg/dL — ABNORMAL LOW (ref 69–380)
Total Protein ELP: 6 g/dL (ref 6.0–8.3)

## 2013-09-16 LAB — BETA 2 MICROGLOBULIN, SERUM: Beta-2 Microglobulin: 2.62 mg/L — ABNORMAL HIGH (ref <=2.51)

## 2013-09-24 IMAGING — CR DG CHEST 2V
2 series · 2 of 2 positions shown · non-contrast
Comparison: CT 01/23/2012.  PET CT 01/17/2012.  12/14/2011.

CLINICAL DATA: 5-day history of productive coughing.  Shortness of
breath.  Low grade fever.  Chest soreness.  History of non-
Hodgkins  lymphoma.

CHEST - 2 VIEW

[view not recorded (1 of 2)]
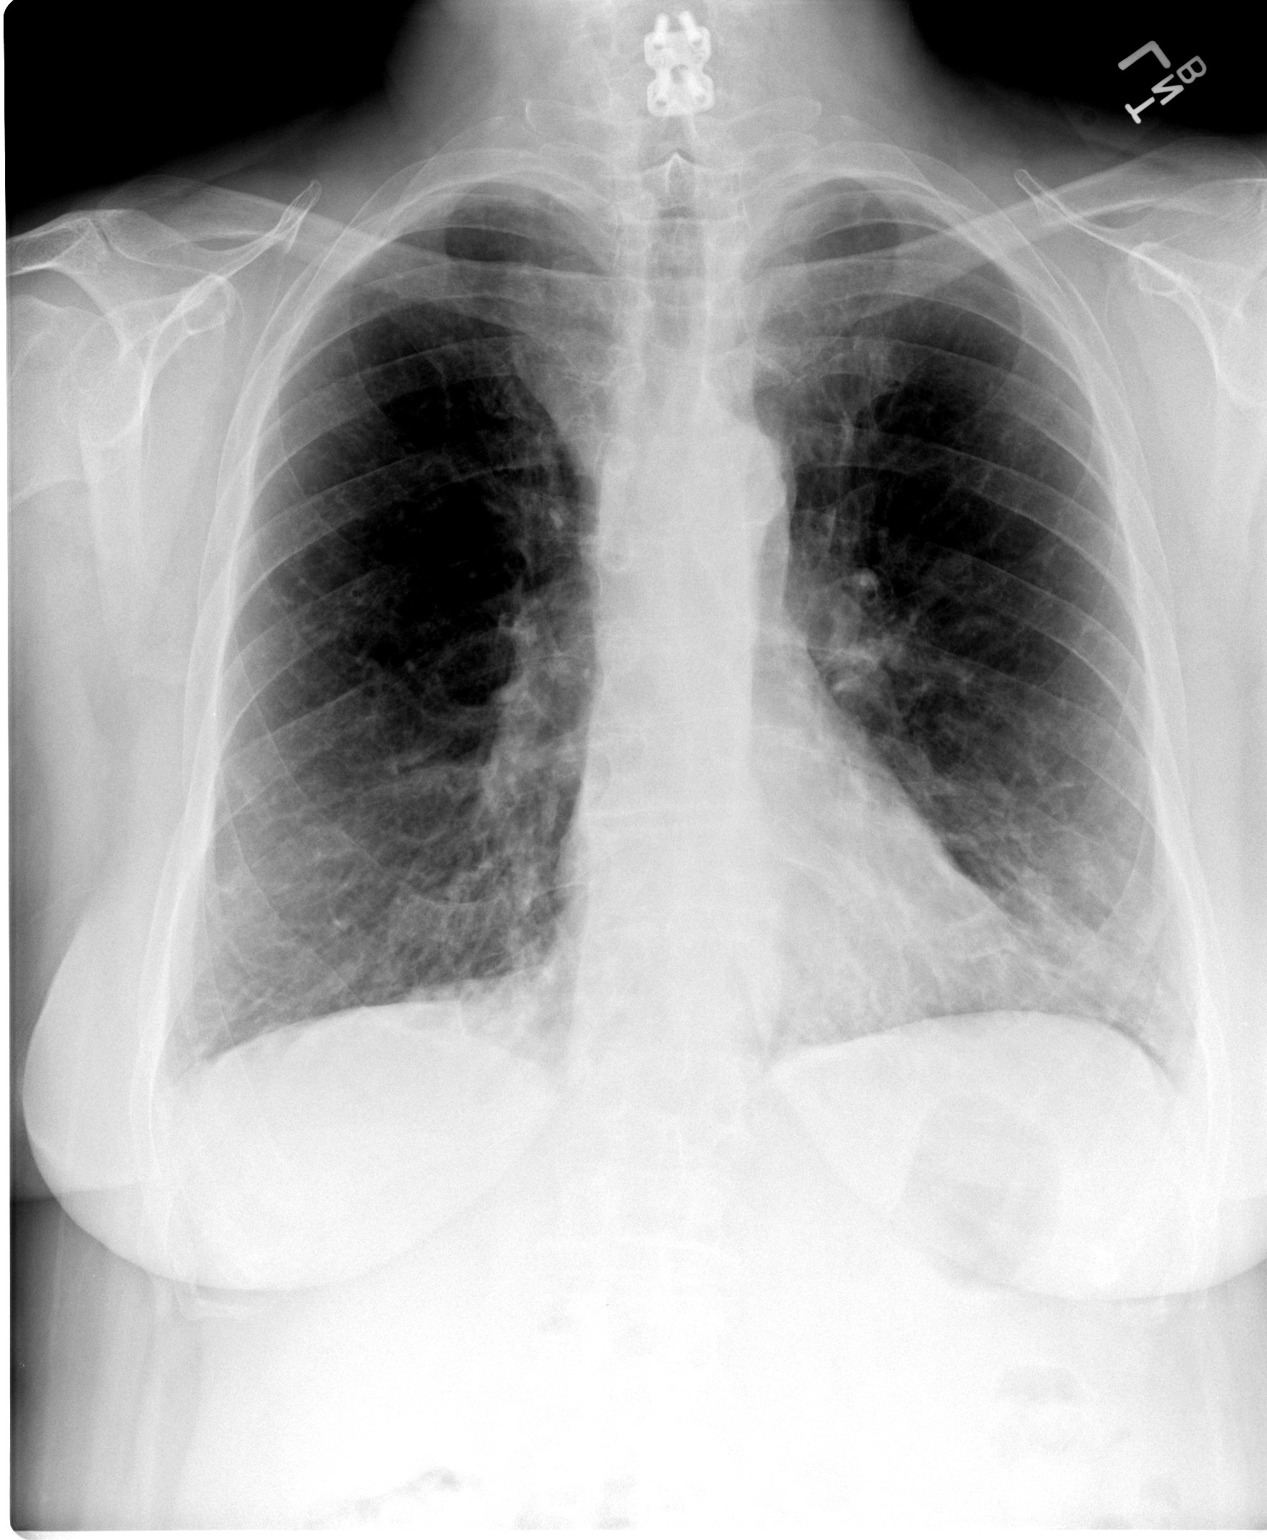

[view not recorded (2 of 2)]
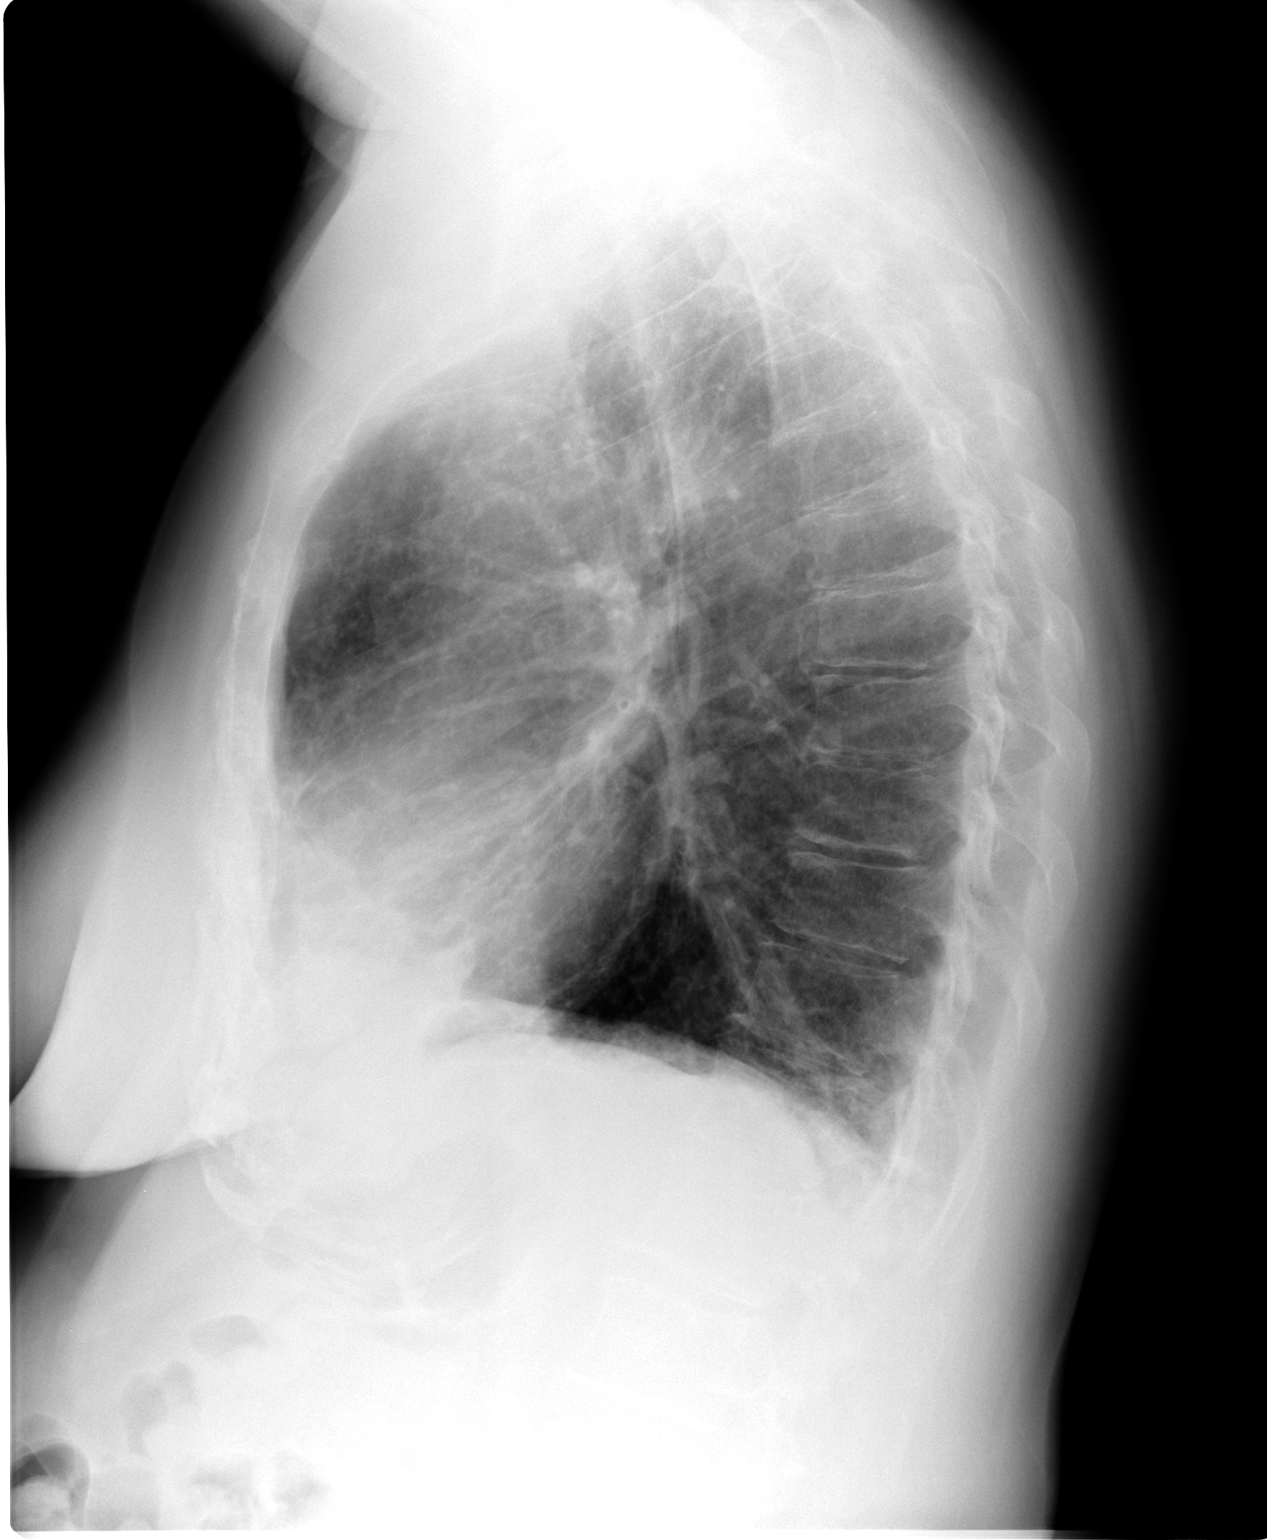

[2 of 2 positions shown; findings below may reference images not displayed]

FINDINGS: This cardiac silhouette is normal size and shape.
Mediastinal and hilar contours appear stable.  Right paratracheal
opacity appears stable.  On previous CT this is seen to represent
vascular structures and fat.  No discrete pulmonary nodules are
identified by plain imaging.  No consolidation or pleural effusion
is seen.  There is minimal degenerative spondylosis.
IMPRESSION: No evidence of pulmonary edema or pneumonia. Previously
demonstrated pulmonary nodular areas cannot be identified by plain
imaging.

## 2013-10-01 ENCOUNTER — Other Ambulatory Visit (HOSPITAL_COMMUNITY): Payer: Self-pay | Admitting: Oncology

## 2013-10-01 ENCOUNTER — Encounter (HOSPITAL_COMMUNITY): Payer: Self-pay | Admitting: Oncology

## 2013-10-06 IMAGING — CT CT CHEST W/ CM
2 of 3 series · 15 of 36 positions shown, 18 images · IV contrast (omnipaque)
Comparison: Multiple exams, including 01/23/2012 and 01/17/2012

CLINICAL DATA: Right upper lobe pulmonary nodule.

CT CHEST WITH CONTRAST
TECHNIQUE: Multidetector CT imaging of the chest was performed
following the standard protocol during bolus administration of
intravenous contrast.
Contrast: 80mL OMNIPAQUE IOHEXOL 300 MG/ML  SOLN

[Series 2: chest with st · axial · 0.74mm/px · z∈[+704,+964]mm · 12 of 62 slices shown, 15 images]
[im 5/62  mediastinal]
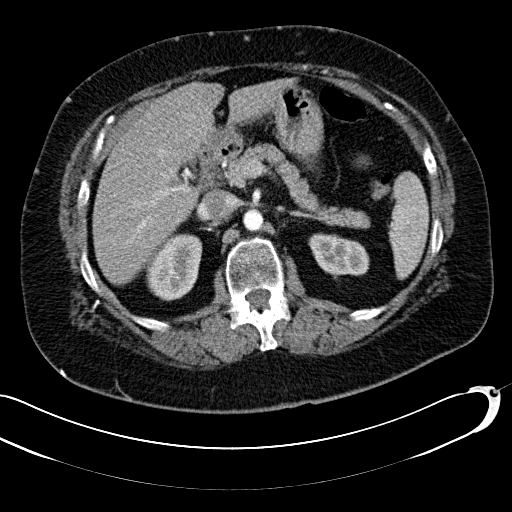
[im 5/62  lung]
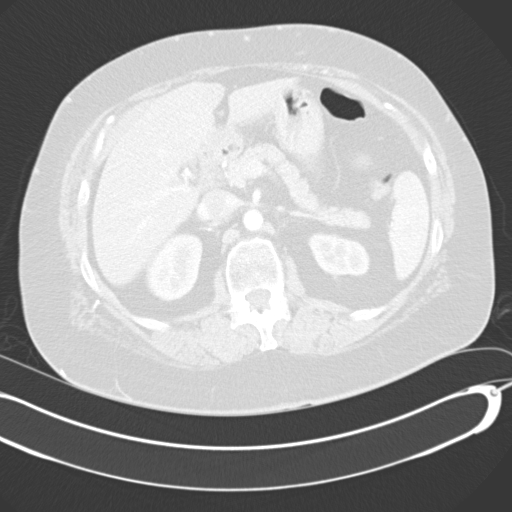
[im 10/62  lung]
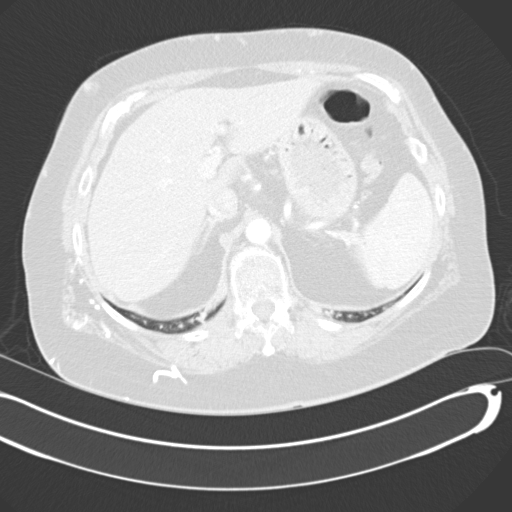
[im 14/62  lung]
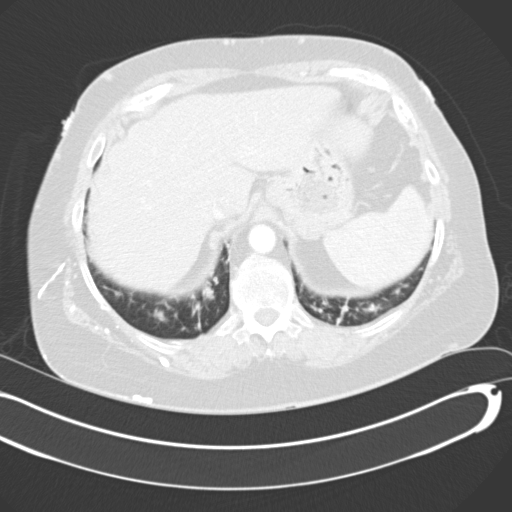
[im 19/62  lung]
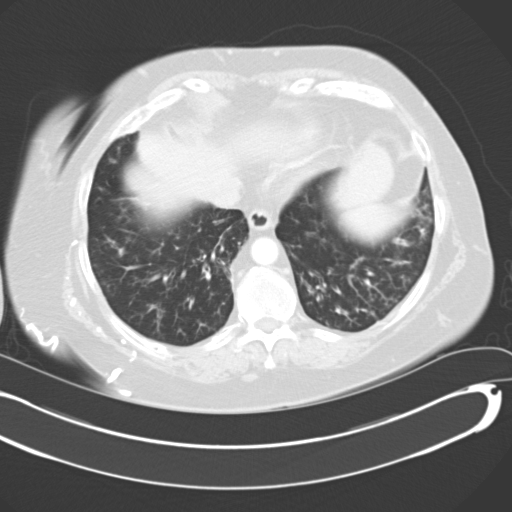
[im 23/62  mediastinal]
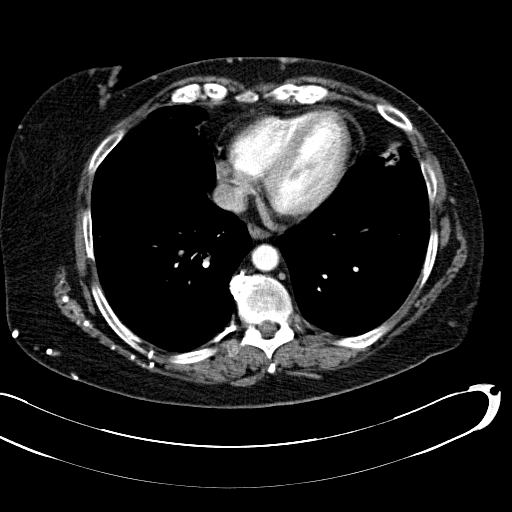
[im 23/62  lung]
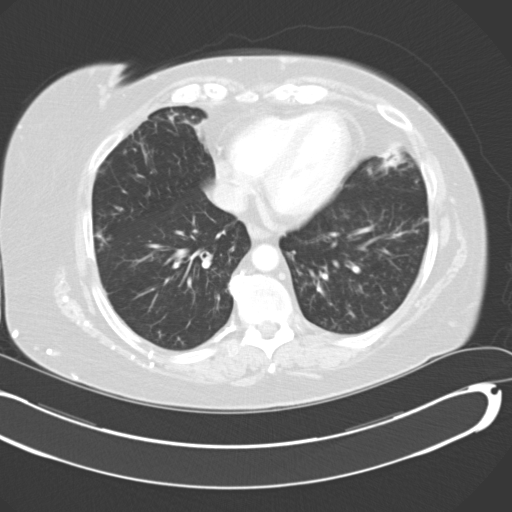
[im 28/62  lung]
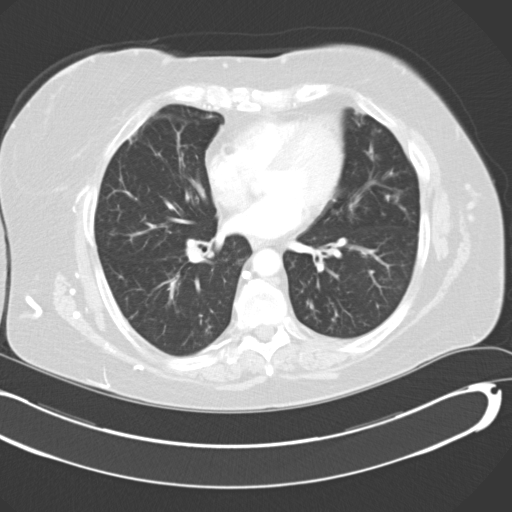
[im 34/62  lung]
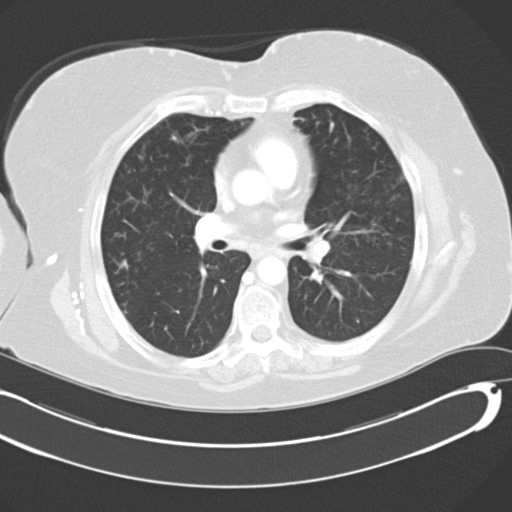
[im 39/62  lung]
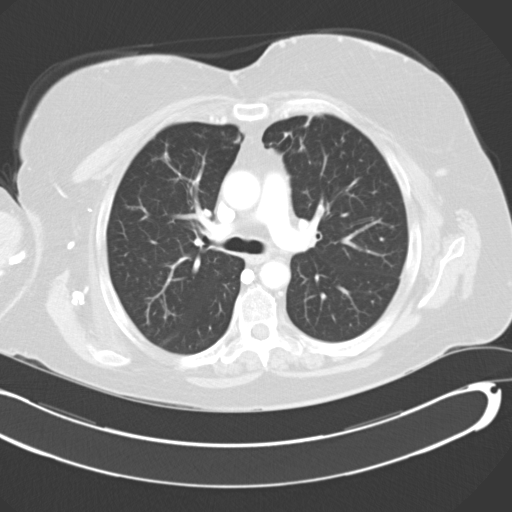
[im 43/62  mediastinal]
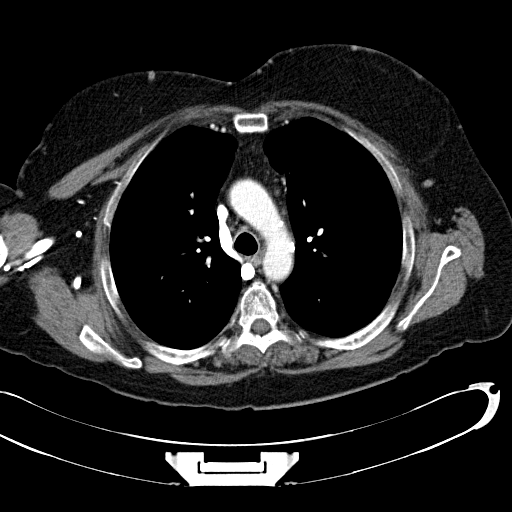
[im 43/62  lung]
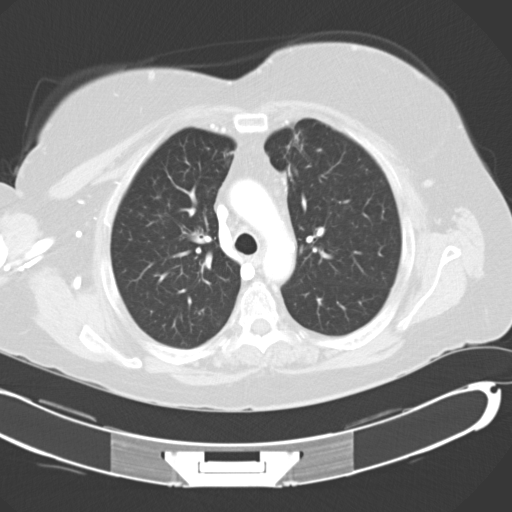
[im 48/62  lung]
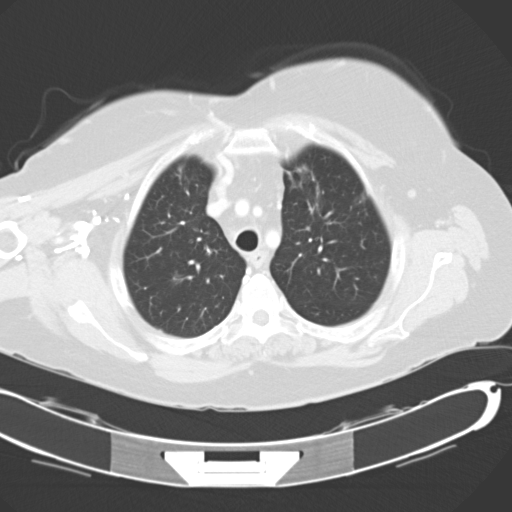
[im 52/62  lung]
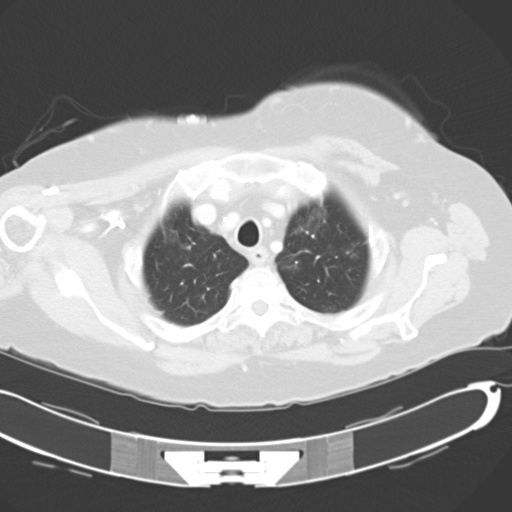
[im 57/62  lung]
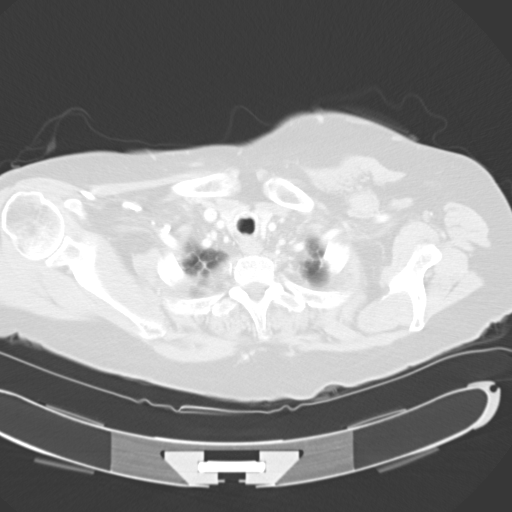

[Series 602: cor · coronal · 0.74mm/px · 3 of 133 slices shown]
[im 27/133  lung]
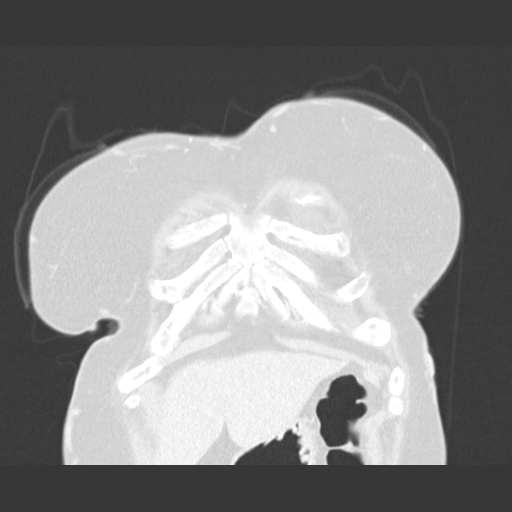
[im 53/133  lung]
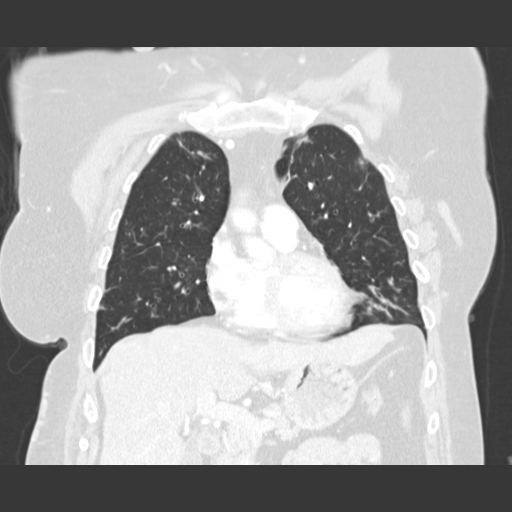
[im 80/133  lung]
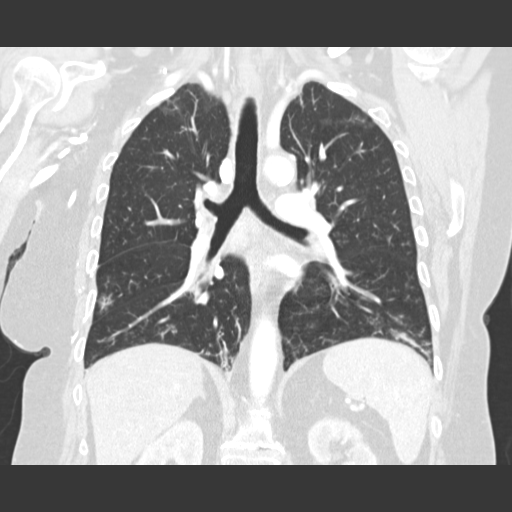

[15 of 36 positions shown; findings below may reference images not displayed]

FINDINGS: Small right paratracheal and AP window lymph nodes noted,
not pathologically enlarged by size criteria.  Venous collaterals
noted in the subcutaneous tissues, with contrast primarily
extending through these collateral vessels suggesting right
subclavian vein or SVC stenosis.  A small hiatal hernia is present.

Biapical pleuroparenchymal scarring noted.  The previously
hypermetabolic lesion of concern anteriorly in the right upper lobe
has now completely resolved. A separate 0.9 x 0.5 cm ground-glass
opacity nodule is observed in the right upper lobe on image 14 of
series 5, not apparent on the PET CT and only very faintly apparent
on the CT scan from 01/23/2012.

There is scattered scarring anteriorly in both upper lobes along
with some airway thickening and tree in bud opacities posteriorly
in the right upper lobe favoring atypical infectious bronchiolitis.
Airway thickening and airway plugging noted in both lower lobes
with cylindrical bronchiectasis and faint nodularity which appears
inflammatory and which has increased compared to the prior exam.

Continued atelectasis noted in the lingula.

Thoracic spondylosis noted.
IMPRESSION: 1.  The previously hypermetabolic right upper lobe nodule has
resolved.  Accordingly, and less specific treatment for malignancy
in this vicinity was administered, this was likely an inflammatory
lesion.
2.  There was airway thickening with lower lobe cylindrical
bronchiectasis and airway plugging, along with scattered faint
inflammatory appearing nodularity in the lungs.
3.  Suspected stenotic right subclavian vein or SVC, with multiple
collaterals observed.
4.  Small hiatal hernia.

## 2013-10-10 ENCOUNTER — Ambulatory Visit (HOSPITAL_COMMUNITY): Payer: Medicare Other

## 2013-10-11 ENCOUNTER — Encounter (HOSPITAL_COMMUNITY): Payer: Medicare Other | Attending: Hematology and Oncology

## 2013-10-11 ENCOUNTER — Encounter (HOSPITAL_BASED_OUTPATIENT_CLINIC_OR_DEPARTMENT_OTHER): Payer: Medicare Other

## 2013-10-11 ENCOUNTER — Encounter (HOSPITAL_COMMUNITY): Payer: Self-pay

## 2013-10-11 VITALS — BP 109/67 | HR 59 | Temp 98.0°F | Resp 18 | Wt 180.3 lb

## 2013-10-11 DIAGNOSIS — D801 Nonfamilial hypogammaglobulinemia: Secondary | ICD-10-CM

## 2013-10-11 DIAGNOSIS — D869 Sarcoidosis, unspecified: Secondary | ICD-10-CM | POA: Insufficient documentation

## 2013-10-11 DIAGNOSIS — D800 Hereditary hypogammaglobulinemia: Secondary | ICD-10-CM | POA: Diagnosis present

## 2013-10-11 DIAGNOSIS — C8330 Diffuse large B-cell lymphoma, unspecified site: Secondary | ICD-10-CM

## 2013-10-11 DIAGNOSIS — C8590 Non-Hodgkin lymphoma, unspecified, unspecified site: Secondary | ICD-10-CM

## 2013-10-11 LAB — ANGIOTENSIN CONVERTING ENZYME: ANGIOTENSIN-CONVERTING ENZYME: 38 U/L (ref 8–52)

## 2013-10-11 LAB — CBC WITH DIFFERENTIAL/PLATELET
BASOS PCT: 0 % (ref 0–1)
Basophils Absolute: 0 10*3/uL (ref 0.0–0.1)
Eosinophils Absolute: 0.1 10*3/uL (ref 0.0–0.7)
Eosinophils Relative: 2 % (ref 0–5)
HCT: 35.9 % — ABNORMAL LOW (ref 36.0–46.0)
Hemoglobin: 11.5 g/dL — ABNORMAL LOW (ref 12.0–15.0)
Lymphocytes Relative: 17 % (ref 12–46)
Lymphs Abs: 1.3 10*3/uL (ref 0.7–4.0)
MCH: 28.8 pg (ref 26.0–34.0)
MCHC: 32 g/dL (ref 30.0–36.0)
MCV: 90 fL (ref 78.0–100.0)
Monocytes Absolute: 0.5 10*3/uL (ref 0.1–1.0)
Monocytes Relative: 6 % (ref 3–12)
NEUTROS ABS: 6.1 10*3/uL (ref 1.7–7.7)
NEUTROS PCT: 75 % (ref 43–77)
Platelets: 168 10*3/uL (ref 150–400)
RBC: 3.99 MIL/uL (ref 3.87–5.11)
RDW: 14.6 % (ref 11.5–15.5)
WBC: 8.1 10*3/uL (ref 4.0–10.5)

## 2013-10-11 LAB — COMPREHENSIVE METABOLIC PANEL
ALBUMIN: 3.4 g/dL — AB (ref 3.5–5.2)
ALK PHOS: 92 U/L (ref 39–117)
ALT: 15 U/L (ref 0–35)
AST: 19 U/L (ref 0–37)
Anion gap: 11 (ref 5–15)
BILIRUBIN TOTAL: 0.4 mg/dL (ref 0.3–1.2)
BUN: 15 mg/dL (ref 6–23)
CO2: 29 mEq/L (ref 19–32)
Calcium: 9 mg/dL (ref 8.4–10.5)
Chloride: 101 mEq/L (ref 96–112)
Creatinine, Ser: 1.04 mg/dL (ref 0.50–1.10)
GFR calc Af Amer: 67 mL/min — ABNORMAL LOW (ref >90)
GFR calc non Af Amer: 58 mL/min — ABNORMAL LOW (ref >90)
Glucose, Bld: 111 mg/dL — ABNORMAL HIGH (ref 70–99)
POTASSIUM: 3.6 meq/L — AB (ref 3.7–5.3)
SODIUM: 141 meq/L (ref 137–147)
Total Protein: 6.6 g/dL (ref 6.0–8.3)

## 2013-10-11 LAB — RETICULOCYTES
RBC.: 3.99 MIL/uL (ref 3.87–5.11)
Retic Count, Absolute: 51.9 10*3/uL (ref 19.0–186.0)
Retic Ct Pct: 1.3 % (ref 0.4–3.1)

## 2013-10-11 LAB — LACTATE DEHYDROGENASE: LDH: 183 U/L (ref 94–250)

## 2013-10-11 MED ORDER — ACETAMINOPHEN 325 MG PO TABS: 650.0000 mg | ORAL_TABLET | Freq: Four times a day (QID) | ORAL | Status: DC | PRN

## 2013-10-11 MED ORDER — IMMUNE GLOBULIN (HUMAN) 20 GM/200ML IV SOLN
40.0000 g | Freq: Once | INTRAVENOUS | Status: AC
  Administered 2013-10-11: 40 g via INTRAVENOUS
  Filled 2013-10-11: qty 400

## 2013-10-11 MED ORDER — SODIUM CHLORIDE 0.9 % IV SOLN
Freq: Once | INTRAVENOUS | Status: AC
  Administered 2013-10-11: 10:00:00 via INTRAVENOUS

## 2013-10-11 NOTE — Progress Notes (Signed)
LABS FOR SIFX,AICE,CBCD,CMP,RETIC,B2M,LDH

## 2013-10-11 NOTE — Patient Instructions (Signed)
[facility] Discharge Instructions  RECOMMENDATIONS MADE BY THE CONSULTANT AND ANY TEST RESULTS WILL BE SENT TO YOUR REFERRING PHYSICIAN.  You were given Immune Globulin today. Please call to schedule your doctor's appointment next week,  We are rechecking your IgG level and you may not need your next infusion.    Thank you for choosing [facility] to provide your oncology and hematology care.  To afford each patient quality time with our providers, please arrive at least 15 minutes before your scheduled appointment time.  With your help, our goal is to use those 15 minutes to complete the necessary work-up to ensure our physicians have the information they need to help with your evaluation and healthcare recommendations.    Effective January 1st, 2014, we ask that you re-schedule your appointment with our physicians should you arrive 10 or more minutes late for your appointment.  We strive to give you quality time with our providers, and arriving late affects you and other patients whose appointments are after yours.    Again, thank you for choosing [facility].  Our hope is that these requests will decrease the amount of time that you wait before being seen by our physicians.       _____________________________________________________________  Should you have questions after your visit to [facility], please contact our office at [phone] between the hours of 8:30 a.m. and 4:30 p.m.  Voicemails left after 4:30 p.m. will not be returned until the following business day.  For prescription refill requests, have your pharmacy contact our office with your prescription refill request.    _______________________________________________________________  We hope that we have given you very good care.  You may receive a patient satisfaction survey in the mail, please complete it and return it as soon as possible.  We value your  feedback!  _______________________________________________________________  Have you asked about our STAR program?  STAR stands for Survivorship Training and Rehabilitation, and this is a nationally recognized cancer care program that focuses on survivorship and rehabilitation.  Cancer and cancer treatments may cause problems, such as, pain, making you feel tired and keeping you from doing the things that you need or want to do. Cancer rehabilitation can help. Our goal is to reduce these troubling effects and help you have the best quality of life possible.  You may receive a survey from a nurse that asks questions about your current state of health.  Based on the survey results, all eligible patients will be referred to the [facility] program for an evaluation so we can better serve you!  A frequently asked questions sheet is available upon request.

## 2013-10-11 NOTE — Progress Notes (Signed)
Patient tolerated infusion well.

## 2013-10-11 NOTE — Progress Notes (Signed)
[name]  PCP Leonides Grills, MD [place] [address]  DIAGNOSIS: Hypogammaglobulinemia                        B-cell NHL in Remission  CURRENT THERAPY:  IVIG infusions  INTERVAL HEMATOLOGY/ONCOLOGY HX: [name] is a 58 yo woman who has completed treatment with Rituxan and CHOP x 4 for her Stage IV diffuse large cell lymphoma 2 years ago. Treatment course was reduced from 6 cycles due to Pseudomonas sepsis which was life threatening. She is receiving monthly infusions of IVIG which she is tolerating satisfactorily. Her IgG level remains in the low normal range above 756m. She does have a high out of pocket expnse for treaments. The patient was seen in the infusion suite.  MEDICAL HISTORY:  Past Medical History  Diagnosis Date  . Non Hodgkin's lymphoma   . Allergic rhinitis   . GERD (gastroesophageal reflux disease)   . Asthma   . Chronic sinusitis   . Respiratory failure   . Cavitary lung disease   . PONV (postoperative nausea and vomiting)   . Endotracheally intubated   . Myocardial infarction 2011  . Anxiety   . Chronic kidney disease   . Anemia   . Pneumonia 01/2013  . 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    ALLERGIES: unchanged  MEDICATIONS: unchanged  FAMILY HISTORY: unchanged  REVIEW OF SYSTEMS: deferred    PHYSICAL EXAMINATION: Not repeated   V.S. Per nurse [name] in EPIC    LABORATORY DATA: Lab on 10/11/2013  Component Date Value Ref Range Status  . WBC 10/11/2013 8.1  4.0 - 10.5  K/uL Final  . RBC 10/11/2013 3.99  3.87 - 5.11 MIL/uL Final  . Hemoglobin 10/11/2013 11.5* 12.0 - 15.0 g/dL Final  . HCT 10/11/2013 35.9* 36.0 - 46.0 % Final  . MCV 10/11/2013 90.0  78.0 - 100.0 fL Final  . MCH 10/11/2013 28.8  26.0 - 34.0 pg Final  . MCHC 10/11/2013 32.0  30.0 - 36.0 g/dL Final  . RDW 10/11/2013 14.6  11.5 - 15.5 % Final  . Platelets 10/11/2013 168  150 - 400 K/uL Final  . Neutrophils Relative % 10/11/2013 75  43 - 77 % Final  . Neutro Abs 10/11/2013 6.1  1.7 - 7.7 K/uL Final  . Lymphocytes Relative 10/11/2013 17  12 - 46 % Final  . Lymphs Abs 10/11/2013 1.3  0.7 - 4.0 K/uL Final  . Monocytes Relative 10/11/2013 6  3 - 12 % Final  . Monocytes Absolute 10/11/2013 0.5  0.1 - 1.0 K/uL Final  . Eosinophils Relative 10/11/2013 2  0 - 5 % Final  . Eosinophils Absolute 10/11/2013 0.1  0.0 - 0.7 K/uL Final  . Basophils Relative 10/11/2013 0  0 - 1 % Final  . Basophils Absolute 10/11/2013 0.0  0.0 - 0.1 K/uL Final  . Sodium 10/11/2013 141  137 - 147 mEq/L Final  . Potassium 10/11/2013 3.6* 3.7 - 5.3 mEq/L Final  . Chloride 10/11/2013 101  96 - 112 mEq/L Final  . CO2 10/11/2013 29  19 - 32 mEq/L Final  .  Glucose, Bld 10/11/2013 111* 70 - 99 mg/dL Final  . BUN 10/11/2013 15  6 - 23 mg/dL Final  . Creatinine, Ser 10/11/2013 1.04  0.50 - 1.10 mg/dL Final  . Calcium 10/11/2013 9.0  8.4 - 10.5 mg/dL Final  . Total Protein 10/11/2013 6.6  6.0 - 8.3 g/dL Final  . Albumin 10/11/2013 3.4* 3.5 - 5.2 g/dL Final  . AST 10/11/2013 19  0 - 37 U/L Final  . ALT 10/11/2013 15  0 - 35 U/L Final  . Alkaline Phosphatase 10/11/2013 92  39 - 117 U/L Final  . Total Bilirubin 10/11/2013 0.4  0.3 - 1.2 mg/dL Final  . GFR calc non Af Amer 10/11/2013 58* >90 mL/min Final  . GFR calc Af Amer 10/11/2013 67* >90 mL/min Final   Comment: ([name])                          The eGFR has been calculated using the CKD EPI equation.                          This calculation has not been validated in all  clinical situations.                          eGFR's persistently <90 mL/min signify possible Chronic Kidney                          Disease.  . Anion gap 10/11/2013 11  5 - 15 Final  . LDH 10/11/2013 183  94 - 250 U/L Final  . Retic Ct Pct 10/11/2013 1.3  0.4 - 3.1 % Final  . RBC. 10/11/2013 3.99  3.87 - 5.11 MIL/uL Final  . Retic Count, Manual 10/11/2013 51.9  19.0 - 186.0 K/uL Final  . Angiotensin-Converting Enzyme 10/11/2013 38  8 - 52 U/L Final   Performed at [facility] on 09/12/2013  Component Date Value Ref Range Status  . WBC 09/12/2013 7.6  4.0 - 10.5 K/uL Final  . RBC 09/12/2013 4.16  3.87 - 5.11 MIL/uL Final  . Hemoglobin 09/12/2013 12.0  12.0 - 15.0 g/dL Final  . HCT 09/12/2013 37.5  36.0 - 46.0 % Final  . MCV 09/12/2013 90.1  78.0 - 100.0 fL Final  . MCH 09/12/2013 28.8  26.0 - 34.0 pg Final  . MCHC 09/12/2013 32.0  30.0 - 36.0 g/dL Final  . RDW 09/12/2013 14.6  11.5 - 15.5 % Final  . Platelets 09/12/2013 187  150 - 400 K/uL Final  . Neutrophils Relative % 09/12/2013 74  43 - 77 % Final  . Neutro Abs 09/12/2013 5.6  1.7 - 7.7 K/uL Final  . Lymphocytes Relative 09/12/2013 18  12 - 46 % Final  . Lymphs Abs 09/12/2013 1.4  0.7 - 4.0 K/uL Final  . Monocytes Relative 09/12/2013 6  3 - 12 % Final  . Monocytes Absolute 09/12/2013 0.5  0.1 - 1.0 K/uL Final  . Eosinophils Relative 09/12/2013 2  0 - 5 % Final  . Eosinophils Absolute 09/12/2013 0.1  0.0 - 0.7 K/uL Final  . Basophils Relative 09/12/2013 0  0 - 1 % Final  . Basophils Absolute 09/12/2013 0.0  0.0 - 0.1 K/uL Final  . Sodium 09/12/2013 144  137 - 147 mEq/L Final  . Potassium 09/12/2013 3.6* 3.7 - 5.3 mEq/L Final  .  Chloride 09/12/2013 103  96 - 112 mEq/L Final  . CO2 09/12/2013 29  19 - 32 mEq/L Final  . Glucose, Bld 09/12/2013 89  70 - 99 mg/dL Final  . BUN 09/12/2013 13  6 - 23 mg/dL Final  . Creatinine, Ser 09/12/2013 1.06  0.50 - 1.10 mg/dL Final  . Calcium 09/12/2013 8.9  8.4 - 10.5  mg/dL Final  . Total Protein 09/12/2013 6.6  6.0 - 8.3 g/dL Final  . Albumin 09/12/2013 3.4* 3.5 - 5.2 g/dL Final  . AST 09/12/2013 17  0 - 37 U/L Final  . ALT 09/12/2013 13  0 - 35 U/L Final  . Alkaline Phosphatase 09/12/2013 90  39 - 117 U/L Final  . Total Bilirubin 09/12/2013 0.3  0.3 - 1.2 mg/dL Final  . GFR calc non Af Amer 09/12/2013 57* >90 mL/min Final  . GFR calc Af Amer 09/12/2013 66* >90 mL/min Final   Comment: ([name])                          The eGFR has been calculated using the CKD EPI equation.                          This calculation has not been validated in all clinical situations.                          eGFR's persistently <90 mL/min signify possible Chronic Kidney                          Disease.  . Anion gap 09/12/2013 12  5 - 15 Final  . Beta-2 Microglobulin 09/12/2013 2.62* <=2.51 mg/L Final   Performed at [facility]  . LDH 09/12/2013 215  94 - 250 U/L Final  . Total Protein ELP 09/12/2013 6.0  6.0 - 8.3 g/dL Final  . IgG (Immunoglobin G), Serum 09/12/2013 808  690 - 1700 mg/dL Final  . IgA 09/12/2013 65* 69 - 380 mg/dL Final  . IgM, Serum 09/12/2013 69  52 - 322 mg/dL Final  . Immunofix Electr Int 09/12/2013 ([name])   Final   Comment: No monoclonal protein identified.                          Reviewed by Odis Hollingshead, MD, PhD, FCAP (Electronic Signature on                          File)                          Performed at [facility]  . Angiotensin-Converting Enzyme 09/12/2013 40  8 - 52 U/L Final   Performed at [facility]   IgG level pending   ASSESSMENT:  1. Diffuse large cell NHL (B-cell) clinically with no evidence of disease with negative examination on 09/12/2013 and negative PET scan on June 24, 2013   RECOMMENDATIONS/PLAN:  1. IVIG therapy for post chemotherapy hypogammaglobulinemia. Consider decreasing frequency of infusions to every other month. Return appt. For labs and examination in 1 month.   All  questions were answered. The patient knows to call the clinic with any problems, questions or concerns.    Darrall Dears, MD 10/11/2013 10:16 PM

## 2013-10-14 LAB — BETA 2 MICROGLOBULIN, SERUM: Beta-2 Microglobulin: 2.69 mg/L — ABNORMAL HIGH (ref <=2.51)

## 2013-10-15 LAB — CARDIOLIPIN ANTIBODIES, IGG, IGM, IGA
ANTICARDIOLIPIN IGM: 1 [MPL'U]/mL — AB (ref <11)
Anticardiolipin IgA: 5 APL U/mL — ABNORMAL LOW (ref <22)
Anticardiolipin IgG: 12 GPL U/mL (ref <23)

## 2013-10-15 LAB — IMMUNOFIXATION ELECTROPHORESIS
IGM, SERUM: 94 mg/dL (ref 52–322)
IgA: 61 mg/dL — ABNORMAL LOW (ref 69–380)
IgG (Immunoglobin G), Serum: 744 mg/dL (ref 690–1700)
TOTAL PROTEIN ELP: 5.9 g/dL — AB (ref 6.0–8.3)

## 2013-10-16 ENCOUNTER — Encounter: Payer: Self-pay | Admitting: Critical Care Medicine

## 2013-10-16 ENCOUNTER — Ambulatory Visit (INDEPENDENT_AMBULATORY_CARE_PROVIDER_SITE_OTHER): Payer: Medicare Other | Admitting: Critical Care Medicine

## 2013-10-16 VITALS — BP 136/86 | HR 82 | Temp 98.3°F | Ht 62.0 in | Wt 180.4 lb

## 2013-10-16 DIAGNOSIS — J4551 Severe persistent asthma with (acute) exacerbation: Secondary | ICD-10-CM

## 2013-10-16 MED ORDER — SULFAMETHOXAZOLE-TMP DS 800-160 MG PO TABS: 1.0000 | ORAL_TABLET | Freq: Two times a day (BID) | ORAL | Status: DC

## 2013-10-16 MED ORDER — PREDNISONE 10 MG PO TABS: ORAL_TABLET | ORAL | Status: DC

## 2013-10-16 NOTE — Patient Instructions (Signed)
Take bactrim one twice daily for 10day Get your protime checked once around day 5-7 of therapy, the antibiotic can make the warfarin work stronger Prednisone increase 30mg  daily for 4 days then 20mg  daily for 4 days then 1 daily for 4 days then 1/2 daily Return 4 months

## 2013-10-16 NOTE — Assessment & Plan Note (Signed)
Severe persistent asthma with recent exacerbation now recurrent Acute tracheobronchitis Plan Take bactrim one twice daily for 10day The  protime needs to be checked once around day 5-7 of therapy d/t ABX interaction with warfarin Prednisone increase 30mg  daily for 4 days then 20mg  daily for 4 days then 1 daily for 4 days then 1/2 daily Return 4 months

## 2013-10-16 NOTE — Progress Notes (Signed)
Subjective:    Patient ID: [name], female    DOB: Jul 24, 1955, 58 y.o.   MRN: [id number]  HPI 10/16/2013 Chief Complaint  Patient presents with  . 4 month follow up    Has good days and bad days.  Temp up to 100.1 on Monday night, cough with yellow mucus, and wheezing.  SOB is at baseline.  No chest tightness/pain.    Notes low grade fever past two days. Min cough yellow mucus. No sinus pressure. No pndrip. Notes some mucus from lungs as well.  No edema in feet.  No real chest pain.   PUL ASTHMA HISTORY 10/16/2013 05/23/2013 01/07/2013 07/11/2012 05/25/2012  Symptoms Daily >2 days/week 0-2 days/week Daily Daily  Nighttime awakenings 3-4/month 0-2/month 0-2/month 0-2/month >1/wk but not nightly  Interference with activity Some limitations Minor limitations Minor limitations Some limitations Some limitations  SABA use > 2 days/wk--not > 1 x/day 0-2 days/wk 0-2 days/wk > 2 days/wk--not > 1 x/day Daily  Exacerbations requiring oral steroids 0-1 / year 2 or more / year 0-1 / year 2 or more / year 2 or more / year     Review of Systems Constitutional:   No  weight loss, night sweats,  Fevers, chills, fatigue, lassitude. HEENT:   No headaches,  Difficulty swallowing,  Tooth/dental problems,  Sore throat,                No sneezing, itching, ear ache, nasal congestion, post nasal drip,   CV:  No chest pain,  Orthopnea, PND, swelling in lower extremities, anasarca, dizziness, palpitations  GI  No heartburn, indigestion, abdominal pain, nausea, vomiting, diarrhea, change in bowel habits, loss of appetite  Resp: Notes shortness of breath with exertion not  at rest.  Notes  excess mucus, no productive cough,  No non-productive cough,  No coughing up of blood.  No change in color of mucus.  No wheezing.  No chest wall deformity  Skin: no rash or lesions.  GU: no dysuria, change in color of urine, no urgency or frequency.  No flank pain.  MS:  No joint pain or swelling.  No decreased range of  motion.  No back pain.  Psych:  No change in mood or affect. No depression or anxiety.  No memory loss.     Objective:   Physical Exam Filed Vitals:   10/16/13 0917  BP: 136/86  Pulse: 82  Temp: 98.3 F (36.8 C)  TempSrc: Oral  Height: 5\' 2"  (1.575 m)  Weight: 180 lb 6.4 oz (81.829 kg)  SpO2: 97%    Gen: Pleasant, well-nourished, in no distress,  normal affect  ENT: No lesions,  mouth clear,  oropharynx clear, no postnasal drip, mild R nares purulence  Neck: No JVD, no TMG, no carotid bruits  Lungs: No use of accessory muscles, no dullness to percussion, expir wheezes, poor airflow  Cardiovascular: RRR, heart sounds normal, no murmur or gallops, no peripheral edema  Abdomen: soft and NT, no HSM,  BS normal  Musculoskeletal: No deformities, no cyanosis or clubbing  Neuro: alert, non focal  Skin: Warm, no lesions or rashes  No results found.     Assessment & Plan:   Severe persistent asthma with acute exacerbation Severe persistent asthma with recent exacerbation now recurrent Acute tracheobronchitis Plan Take bactrim one twice daily for 10day The  protime needs to be checked once around day 5-7 of therapy d/t ABX interaction with warfarin Prednisone increase 30mg  daily for 4 days then  20mg  daily for 4 days then 1 daily for 4 days then 1/2 daily Return 4 months    Updated Medication List Outpatient Encounter Prescriptions as of 10/16/2013  Medication Sig  . acetaminophen (TYLENOL) 500 MG tablet Take 1,000 mg by mouth every 6 (six) hours as needed for pain or fever.   [name] Kitchen acyclovir (ZOVIRAX) 400 MG tablet Take 400 mg by mouth 2 (two) times daily.  [name] Kitchen albuterol (PROVENTIL HFA;VENTOLIN HFA) 108 (90 BASE) MCG/ACT inhaler Inhale 2 puffs into the lungs every 6 (six) hours as needed for wheezing or shortness of breath.  [name] Kitchen albuterol (PROVENTIL) (2.5 MG/3ML) 0.083% nebulizer solution Take 2.5 mg by nebulization every 4 (four) hours as needed for wheezing or shortness of  breath.  . citalopram (CELEXA) 20 MG tablet Take 20 mg by mouth every morning.    . fluticasone (FLONASE) 50 MCG/ACT nasal spray Place 2 sprays into the nose 2 (two) times daily.  [name] Kitchen gabapentin (NEURONTIN) 300 MG capsule Take 300 mg by mouth 4 (four) times daily.  . mometasone-formoterol (DULERA) 200-5 MCG/ACT AERO Inhale 2 puffs into the lungs 2 (two) times daily.  [name] Kitchen omeprazole (PRILOSEC) 20 MG capsule Take 1 capsule (20 mg total) by mouth 2 (two) times daily before a meal.  . predniSONE (DELTASONE) 10 MG tablet Take 3 daily for 4 days , 2 daily for 4 days, 1 daily for 4 days, then 1/2 daily and stay  . simvastatin (ZOCOR) 80 MG tablet Take 80 mg by mouth at bedtime.    . vitamin C (ASCORBIC ACID) 500 MG tablet Take 500 mg by mouth 2 (two) times daily.  [name] Kitchen warfarin (COUMADIN) 10 MG tablet Take 5 mg by mouth every evening.   . zafirlukast (ACCOLATE) 20 MG tablet Take 1 tablet (20 mg total) by mouth 2 (two) times daily.  . [DISCONTINUED] predniSONE (DELTASONE) 10 MG tablet Take 0.5 tablets (5 mg total) by mouth daily.  [name] Kitchen sulfamethoxazole-trimethoprim (BACTRIM DS) 800-160 MG per tablet Take 1 tablet by mouth 2 (two) times daily.  . [DISCONTINUED] Fluticasone-Salmeterol (ADVAIR DISKUS) 500-50 MCG/DOSE AEPB 1 puff inhaled each morning.

## 2013-10-17 ENCOUNTER — Other Ambulatory Visit (HOSPITAL_COMMUNITY): Payer: Self-pay | Admitting: Hematology and Oncology

## 2013-10-17 NOTE — Progress Notes (Signed)
RN spoke to Dr.Formanek today, labs returned and IgG cont. To be low.  Will keep the pt. As scheduled for monthly infusions and increase the dose.  Pt. Notified of current labs and continued plan of care.

## 2013-10-19 IMAGING — CR DG CHEST 2V
2 series · 2 of 2 positions shown · non-contrast
Comparison: 02/02/2012

CLINICAL DATA: Pneumonia, shortness of breath.

CHEST - 2 VIEW

[view not recorded (1 of 2)]
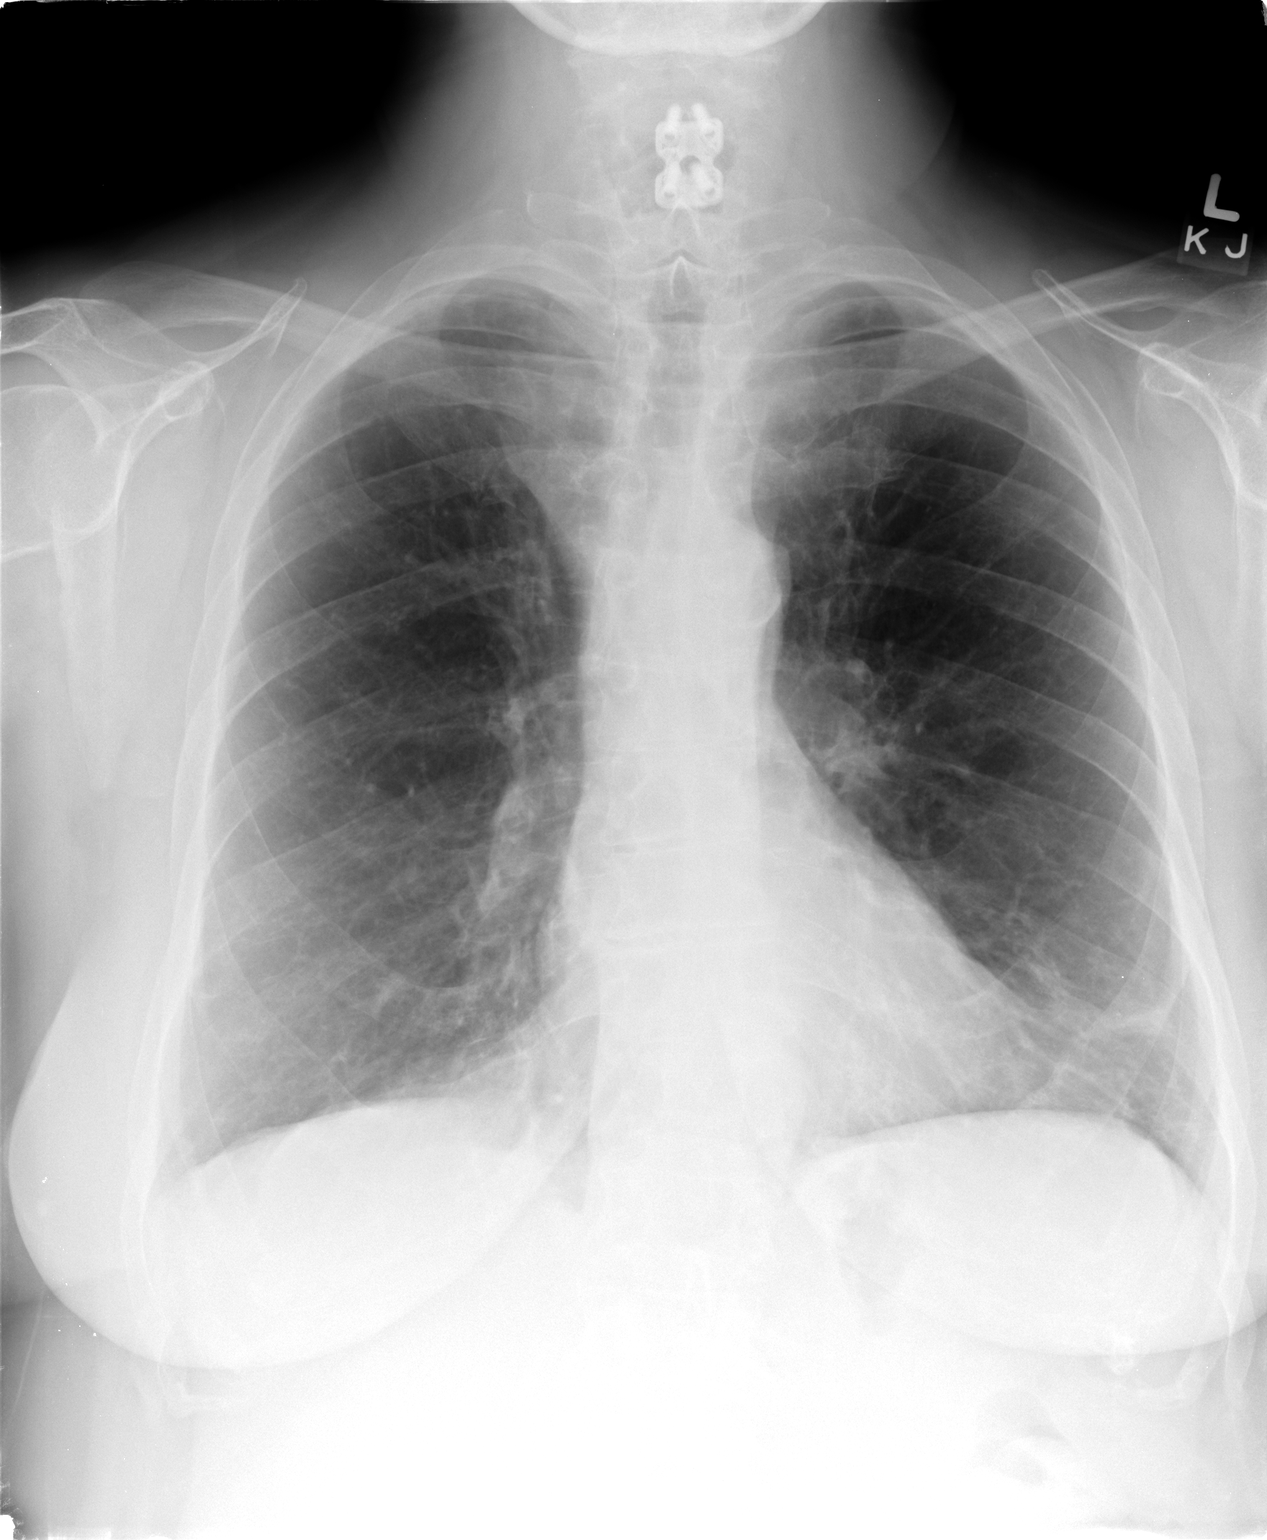

[view not recorded (2 of 2)]
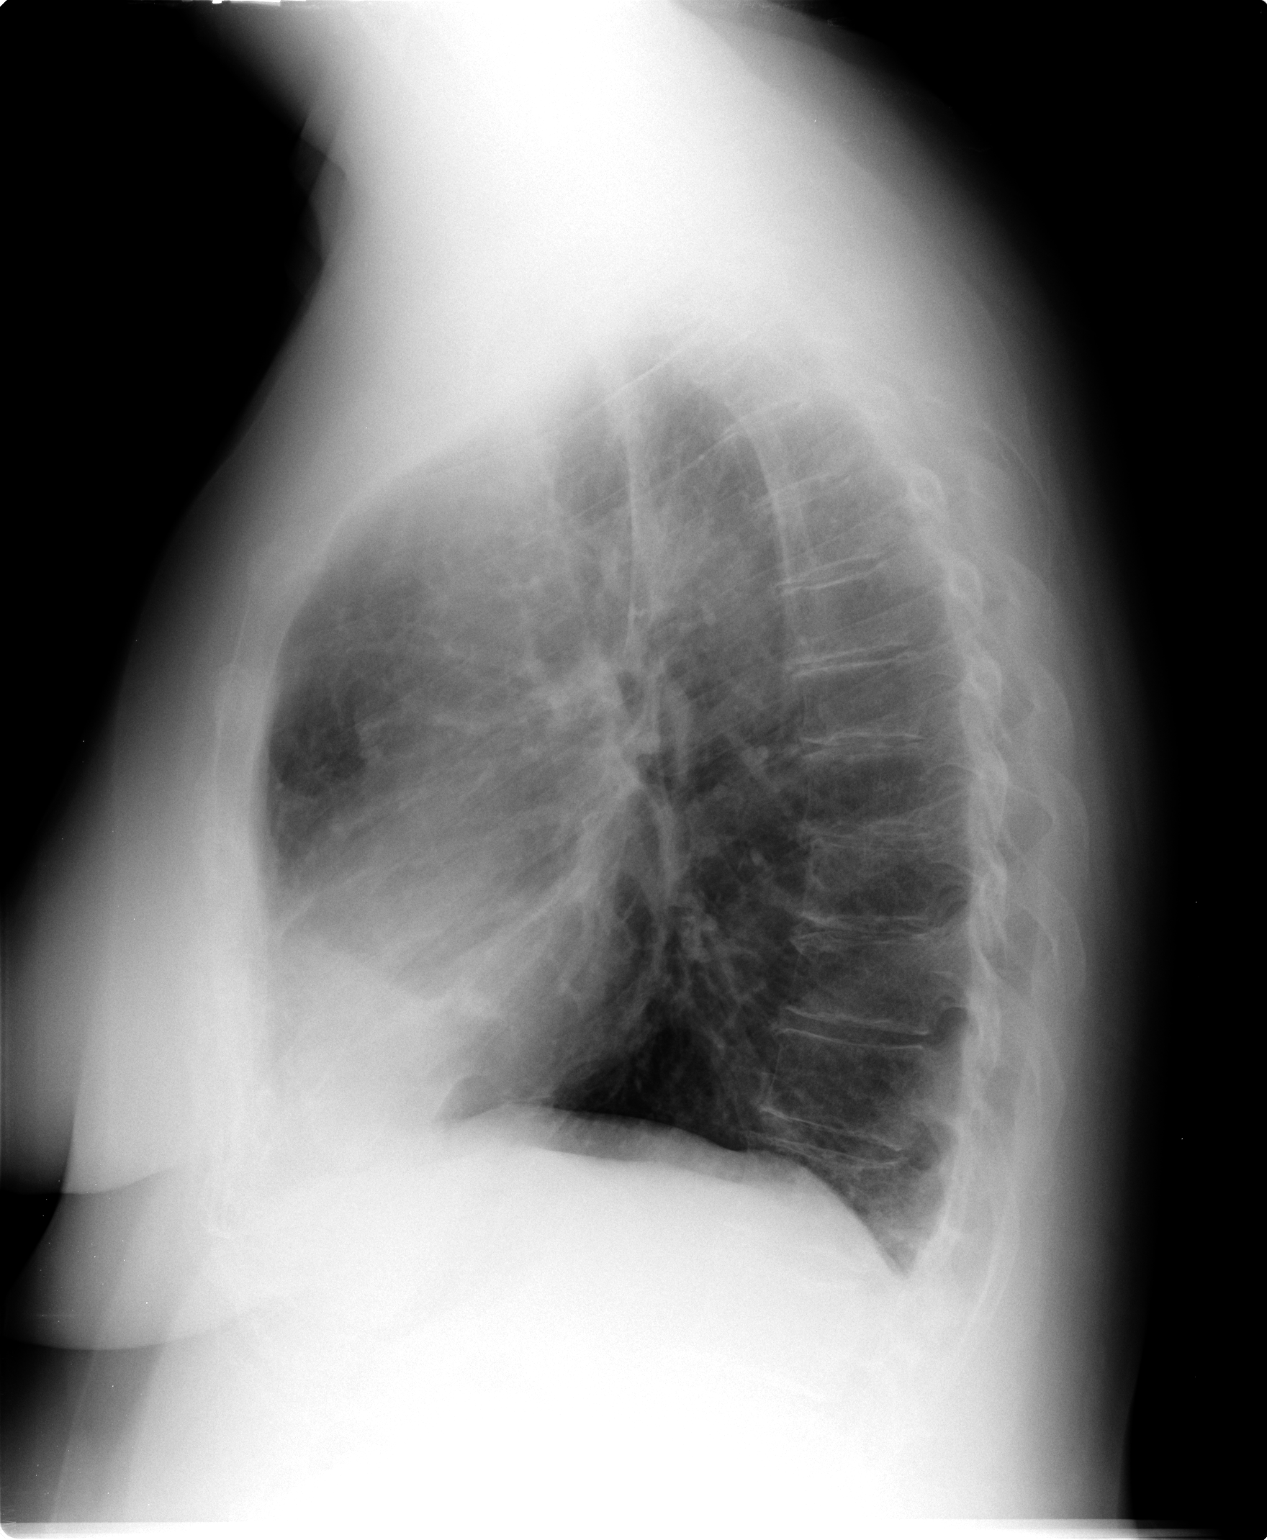

[2 of 2 positions shown; findings below may reference images not displayed]

FINDINGS: Lingular density is increased since prior study,
atelectasis versus infiltrate.  Right lung is clear.  Mild
hyperinflation of the lungs.  Heart is normal size.  No effusions
or acute bony abnormality.
IMPRESSION: Lingular atelectasis or infiltrate, increasing since prior study.

COPD.

## 2013-11-08 ENCOUNTER — Encounter (HOSPITAL_COMMUNITY): Payer: Medicare Other | Attending: Hematology and Oncology

## 2013-11-08 ENCOUNTER — Encounter (HOSPITAL_COMMUNITY): Payer: Self-pay

## 2013-11-08 ENCOUNTER — Encounter (HOSPITAL_BASED_OUTPATIENT_CLINIC_OR_DEPARTMENT_OTHER): Payer: Medicare Other

## 2013-11-08 VITALS — BP 111/72 | HR 70 | Temp 98.1°F | Resp 18 | Wt 176.0 lb

## 2013-11-08 DIAGNOSIS — D801 Nonfamilial hypogammaglobulinemia: Secondary | ICD-10-CM | POA: Diagnosis present

## 2013-11-08 DIAGNOSIS — C8590 Non-Hodgkin lymphoma, unspecified, unspecified site: Secondary | ICD-10-CM

## 2013-11-08 DIAGNOSIS — D800 Hereditary hypogammaglobulinemia: Secondary | ICD-10-CM | POA: Insufficient documentation

## 2013-11-08 DIAGNOSIS — D869 Sarcoidosis, unspecified: Secondary | ICD-10-CM

## 2013-11-08 LAB — COMPREHENSIVE METABOLIC PANEL
ALT: 12 U/L (ref 0–35)
AST: 16 U/L (ref 0–37)
Albumin: 3.6 g/dL (ref 3.5–5.2)
Alkaline Phosphatase: 111 U/L (ref 39–117)
Anion gap: 14 (ref 5–15)
BUN: 17 mg/dL (ref 6–23)
CO2: 28 meq/L (ref 19–32)
Calcium: 9 mg/dL (ref 8.4–10.5)
Chloride: 100 mEq/L (ref 96–112)
Creatinine, Ser: 1.23 mg/dL — ABNORMAL HIGH (ref 0.50–1.10)
GFR calc Af Amer: 55 mL/min — ABNORMAL LOW (ref >90)
GFR calc non Af Amer: 47 mL/min — ABNORMAL LOW (ref >90)
Glucose, Bld: 127 mg/dL — ABNORMAL HIGH (ref 70–99)
Potassium: 4 mEq/L (ref 3.7–5.3)
SODIUM: 142 meq/L (ref 137–147)
Total Bilirubin: 0.3 mg/dL (ref 0.3–1.2)
Total Protein: 7.1 g/dL (ref 6.0–8.3)

## 2013-11-08 LAB — CBC WITH DIFFERENTIAL/PLATELET
Basophils Absolute: 0 10*3/uL (ref 0.0–0.1)
Basophils Relative: 0 % (ref 0–1)
Eosinophils Absolute: 0.1 10*3/uL (ref 0.0–0.7)
Eosinophils Relative: 1 % (ref 0–5)
HEMATOCRIT: 37 % (ref 36.0–46.0)
HEMOGLOBIN: 11.8 g/dL — AB (ref 12.0–15.0)
LYMPHS ABS: 0.8 10*3/uL (ref 0.7–4.0)
Lymphocytes Relative: 10 % — ABNORMAL LOW (ref 12–46)
MCH: 28.6 pg (ref 26.0–34.0)
MCHC: 31.9 g/dL (ref 30.0–36.0)
MCV: 89.8 fL (ref 78.0–100.0)
MONO ABS: 0.4 10*3/uL (ref 0.1–1.0)
MONOS PCT: 5 % (ref 3–12)
NEUTROS ABS: 6.7 10*3/uL (ref 1.7–7.7)
NEUTROS PCT: 84 % — AB (ref 43–77)
Platelets: 168 10*3/uL (ref 150–400)
RBC: 4.12 MIL/uL (ref 3.87–5.11)
RDW: 14 % (ref 11.5–15.5)
WBC: 8 10*3/uL (ref 4.0–10.5)

## 2013-11-08 LAB — LACTATE DEHYDROGENASE: LDH: 205 U/L (ref 94–250)

## 2013-11-08 LAB — RETICULOCYTES
RBC.: 4.12 MIL/uL (ref 3.87–5.11)
RETIC COUNT ABSOLUTE: 49.4 10*3/uL (ref 19.0–186.0)
RETIC CT PCT: 1.2 % (ref 0.4–3.1)

## 2013-11-08 LAB — ANGIOTENSIN CONVERTING ENZYME: ANGIOTENSIN-CONVERTING ENZYME: 42 U/L (ref 8–52)

## 2013-11-08 MED ORDER — ACETAMINOPHEN 325 MG PO TABS: 650.0000 mg | ORAL_TABLET | Freq: Four times a day (QID) | ORAL | Status: DC | PRN

## 2013-11-08 MED ORDER — SODIUM CHLORIDE 0.9 % IV SOLN
Freq: Once | INTRAVENOUS | Status: AC
  Administered 2013-11-08: 10:00:00 via INTRAVENOUS

## 2013-11-08 MED ORDER — IMMUNE GLOBULIN (HUMAN) 10 GM/100ML IV SOLN
50.0000 g | Freq: Once | INTRAVENOUS | Status: AC
Start: 2013-11-08 — End: 2013-11-08
  Administered 2013-11-08: 50 g via INTRAVENOUS
  Filled 2013-11-08: qty 100

## 2013-11-08 NOTE — Patient Instructions (Signed)
[facility] Discharge Instructions  RECOMMENDATIONS MADE BY THE CONSULTANT AND ANY TEST RESULTS WILL BE SENT TO YOUR REFERRING PHYSICIAN.  Your were given immune globulin today.   Please call for any questions or concerns.   Thank you for choosing [facility] to provide your oncology and hematology care.  To afford each patient quality time with our providers, please arrive at least 15 minutes before your scheduled appointment time.  With your help, our goal is to use those 15 minutes to complete the necessary work-up to ensure our physicians have the information they need to help with your evaluation and healthcare recommendations.    Effective January 1st, 2014, we ask that you re-schedule your appointment with our physicians should you arrive 10 or more minutes late for your appointment.  We strive to give you quality time with our providers, and arriving late affects you and other patients whose appointments are after yours.    Again, thank you for choosing [facility].  Our hope is that these requests will decrease the amount of time that you wait before being seen by our physicians.       _____________________________________________________________  Should you have questions after your visit to [facility], please contact our office at [phone] between the hours of 8:30 a.m. and 4:30 p.m.  Voicemails left after 4:30 p.m. will not be returned until the following business day.  For prescription refill requests, have your pharmacy contact our office with your prescription refill request.    _______________________________________________________________  We hope that we have given you very good care.  You may receive a patient satisfaction survey in the mail, please complete it and return it as soon as possible.  We value your feedback!  _______________________________________________________________  Have you asked about  our STAR program?  STAR stands for Survivorship Training and Rehabilitation, and this is a nationally recognized cancer care program that focuses on survivorship and rehabilitation.  Cancer and cancer treatments may cause problems, such as, pain, making you feel tired and keeping you from doing the things that you need or want to do. Cancer rehabilitation can help. Our goal is to reduce these troubling effects and help you have the best quality of life possible.  You may receive a survey from a nurse that asks questions about your current state of health.  Based on the survey results, all eligible patients will be referred to the [facility] program for an evaluation so we can better serve you!  A frequently asked questions sheet is available upon request.

## 2013-11-08 NOTE — Progress Notes (Signed)
LABS FOR CBCD,CMP,LDH,B2M,RETIC,AICE,SIFX

## 2013-11-08 NOTE — Patient Instructions (Signed)
[facility] Discharge Instructions  RECOMMENDATIONS MADE BY THE CONSULTANT AND ANY TEST RESULTS WILL BE SENT TO YOUR REFERRING PHYSICIAN.  Return in one month for labs and IVIG Consider port-a-cath placement (if you decide yes, let us know so we can do your referral)  Thank you for choosing [facility] to provide your oncology and hematology care.  To afford each patient quality time with our providers, please arrive at least 15 minutes before your scheduled appointment time.  With your help, our goal is to use those 15 minutes to complete the necessary work-up to ensure our physicians have the information they need to help with your evaluation and healthcare recommendations.    Effective January 1st, 2014, we ask that you re-schedule your appointment with our physicians should you arrive 10 or more minutes late for your appointment.  We strive to give you quality time with our providers, and arriving late affects you and other patients whose appointments are after yours.    Again, thank you for choosing [facility].  Our hope is that these requests will decrease the amount of time that you wait before being seen by our physicians.       _____________________________________________________________  Should you have questions after your visit to [facility], please contact our office at [phone] between the hours of 8:30 a.m. and 4:30 p.m.  Voicemails left after 4:30 p.m. will not be returned until the following business day.  For prescription refill requests, have your pharmacy contact our office with your prescription refill request.    _______________________________________________________________  We hope that we have given you very good care.  You may receive a patient satisfaction survey in the mail, please complete it and return it as soon as possible.  We value your  feedback!  _______________________________________________________________  Have you asked about our STAR program?  STAR stands for Survivorship Training and Rehabilitation, and this is a nationally recognized cancer care program that focuses on survivorship and rehabilitation.  Cancer and cancer treatments may cause problems, such as, pain, making you feel tired and keeping you from doing the things that you need or want to do. Cancer rehabilitation can help. Our goal is to reduce these troubling effects and help you have the best quality of life possible.  You may receive a survey from a nurse that asks questions about your current state of health.  Based on the survey results, all eligible patients will be referred to the [facility] program for an evaluation so we can better serve you!  A frequently asked questions sheet is available upon request.

## 2013-11-08 NOTE — Progress Notes (Signed)
[name]  OFFICE PROGRESS NOTE  [name], MD [place] [address]  DIAGNOSIS: No diagnosis found.  No chief complaint on file.   CURRENT THERAPY: Intravenous gammaglobulin monthly for IgG deficiency, last IgG value on 10/11/2013 was 744 while receiving 50 g of IgG monthly.  INTERVAL HISTORY: [name] 58 y.o. female returns for followup of diffuse large B-cell lymphoma status post R. CHOP x4 cycles (08/14/2009- 10/23/2009) along with intrathecal chemotherapy during cycles 3 and 4 with methotrexate, 12 mg and SoluCortef 50 mg. Her chemotherapy had to be stopped due to severe Pseudomonas sepsis after cycles 2 and cycles 4. These episodes were life-threatening.  AND  IgG deficiency, 50 g of IVIG ever 4 weeks to prevent recurrent infections.  She continues to have episodes of occasional cough which is relieved by short acting inhalers. She denies any fever, night sweats, PND, orthopnea, but does have occasional palpitations. No episodes of worsening lower extremity swelling or redness, chest pain, abdominal pain, nausea, vomiting, melena, hematochezia, hematuria, but does develop ecchymoses involving the upper extremities with trauma. Denies headache, nasal drip, earache, skin rash, headache, or seizures.  MEDICAL HISTORY: Past Medical History  Diagnosis Date  . Non Hodgkin's lymphoma   . Allergic rhinitis   . GERD (gastroesophageal reflux disease)   . Asthma   . Chronic sinusitis   . Respiratory failure   . Cavitary lung disease   . PONV (postoperative nausea and vomiting)   . Endotracheally intubated   . Myocardial infarction 2011  . Anxiety   . Chronic kidney disease   . Anemia   . Pneumonia 01/2013  . 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    ALLERGIES:  is allergic to meperidine hcl and montelukast sodium.  MEDICATIONS: has a current medication list which includes the following prescription(s): acetaminophen, acyclovir, advair diskus, albuterol, albuterol, citalopram, fluticasone, gabapentin, mometasone-formoterol, omeprazole, prednisone, simvastatin, sulfamethoxazole-trimethoprim, vitamin c, warfarin, and zafirlukast, and the following Facility-Administered Medications: acetaminophen and immune globulin 10%.  SURGICAL HISTORY:  Past Surgical History  Procedure Laterality Date  . Vesicovaginal fistula closure w/ tah    . Nasal sinus surgery    . Neck surgery      x 2   . Portacath placement  7/11    Removed 6/12  . Basal cell carcinoma excision  03/2011    scalp  . Port-a-cath removal    . Peripherally inserted [name] catheter insertion    . Picc removal    . Tracheostomy    . Abdominal hysterectomy    . Lung lobectomy      right  . Breast surgery Right   . Colonoscopy N/A 11/14/2012    Procedure: COLONOSCOPY;  Surgeon: Rogene Houston, MD;  Location: [facility];  Service: Endoscopy;  Laterality: N/A;  830    FAMILY HISTORY: family history includes Allergies in her father; Asthma in her father; Diabetes in her brother; Emphysema in her mother; Leukemia in her maternal grandmother; Stroke in her mother.  SOCIAL HISTORY:  reports that she has never smoked. She has never used smokeless tobacco. She reports that she does not  drink alcohol or use illicit drugs.  REVIEW OF SYSTEMS:  Other than that discussed above is noncontributory.  PHYSICAL EXAMINATION: ECOG PERFORMANCE STATUS: 1 - Symptomatic but completely ambulatory  Blood pressure 111/72, pulse 70, temperature  98.1 F (36.7 C), temperature source Oral, resp. rate 18, weight 176 lb (79.833 kg), SpO2 94 %.  GENERAL:alert, no distress and comfortable SKIN: skin color, texture, turgor are normal, no rashes or significant lesions EYES: PERLA; Conjunctiva are pink and non-injected, sclera clear. Small ecchymosis below the right orbit from being hit by an object while holding her granddaughter. SINUSES: No redness or tenderness over maxillary or ethmoid sinuses OROPHARYNX:no exudate, no erythema on lips, buccal mucosa, or tongue. NECK: supple, thyroid normal size, non-tender, without nodularity. No masses CHEST: increased AP diameter with no breast masses. LYMPH:  no palpable lymphadenopathy in the cervical, axillary or inguinal LUNGS: clear to auscultation and percussion with normal breathing effort HEART: regular rate & rhythm and no murmurs. ABDOMEN:abdomen soft, non-tender and normal bowel sounds MUSCULOSKELETAL:no cyanosis of digits and no clubbing. Range of motion normal.  NEURO: alert & oriented x 3 with fluent speech, no focal motor/sensory deficits   LABORATORY DATA: Lab on 11/08/2013  Component Date Value Ref Range Status  . WBC 11/08/2013 8.0  4.0 - 10.5 K/uL Final  . RBC 11/08/2013 4.12  3.87 - 5.11 MIL/uL Final  . Hemoglobin 11/08/2013 11.8* 12.0 - 15.0 g/dL Final  . HCT 11/08/2013 37.0  36.0 - 46.0 % Final  . MCV 11/08/2013 89.8  78.0 - 100.0 fL Final  . MCH 11/08/2013 28.6  26.0 - 34.0 pg Final  . MCHC 11/08/2013 31.9  30.0 - 36.0 g/dL Final  . RDW 11/08/2013 14.0  11.5 - 15.5 % Final  . Platelets 11/08/2013 168  150 - 400 K/uL Final  . Neutrophils Relative % 11/08/2013 84* 43 - 77 % Final  . Neutro Abs 11/08/2013 6.7  1.7 - 7.7 K/uL Final  . Lymphocytes Relative 11/08/2013 10* 12 - 46 % Final  . Lymphs Abs 11/08/2013 0.8  0.7 - 4.0 K/uL Final  . Monocytes Relative 11/08/2013 5  3 - 12 % Final  . Monocytes Absolute 11/08/2013 0.4  0.1 - 1.0 K/uL Final  . Eosinophils Relative  11/08/2013 1  0 - 5 % Final  . Eosinophils Absolute 11/08/2013 0.1  0.0 - 0.7 K/uL Final  . Basophils Relative 11/08/2013 0  0 - 1 % Final  . Basophils Absolute 11/08/2013 0.0  0.0 - 0.1 K/uL Final  . Sodium 11/08/2013 142  137 - 147 mEq/L Final  . Potassium 11/08/2013 4.0  3.7 - 5.3 mEq/L Final  . Chloride 11/08/2013 100  96 - 112 mEq/L Final  . CO2 11/08/2013 28  19 - 32 mEq/L Final  . Glucose, Bld 11/08/2013 127* 70 - 99 mg/dL Final  . BUN 11/08/2013 17  6 - 23 mg/dL Final  . Creatinine, Ser 11/08/2013 1.23* 0.50 - 1.10 mg/dL Final  . Calcium 11/08/2013 9.0  8.4 - 10.5 mg/dL Final  . Total Protein 11/08/2013 7.1  6.0 - 8.3 g/dL Final  . Albumin 11/08/2013 3.6  3.5 - 5.2 g/dL Final  . AST 11/08/2013 16  0 - 37 U/L Final  . ALT 11/08/2013 12  0 - 35 U/L Final  . Alkaline Phosphatase 11/08/2013 111  39 - 117 U/L Final  . Total Bilirubin 11/08/2013 0.3  0.3 - 1.2 mg/dL Final  . GFR calc non Af Amer 11/08/2013 47* >90 mL/min Final  .  GFR calc Af Amer 11/08/2013 55* >90 mL/min Final   Comment: (NOTE) The eGFR has been calculated using the CKD EPI equation. This calculation has not been validated in all clinical situations. eGFR's persistently <90 mL/min signify possible Chronic Kidney Disease.   . Anion gap 11/08/2013 14  5 - 15 Final  . LDH 11/08/2013 205  94 - 250 U/L Final  . Retic Ct Pct 11/08/2013 1.2  0.4 - 3.1 % Final  . RBC. 11/08/2013 4.12  3.87 - 5.11 MIL/uL Final  . Retic Count, Manual 11/08/2013 49.4  19.0 - 186.0 K/uL Final  Infusion on 10/11/2013  Component Date Value Ref Range Status  . Anticardiolipin IgG 10/11/2013 12  <23 GPL U/mL Final  . Anticardiolipin IgM 10/11/2013 1* <11 MPL U/mL Final  . Anticardiolipin IgA 10/11/2013 5* <22 APL U/mL Final   Comment: (NOTE)                          Reference Range:  Cardiolipin IgG                            Normal                  <23                            Low Positive (+)        23-35                             Moderate Positive (+)   36-50                            High Positive (+)       >50                          Reference Range:  Cardiolipin IgM                            Normal                  <11                            Low Positive (+)        11-20                            Moderate Positive (+)   21-30                            High Positive (+)       >30                          Reference Range:  Cardiolipin IgA                            Normal                  <22  Low Positive (+)        22-35                            Moderate Positive (+)   36-45                            High Positive (+)       >45                          Performed at [facility]  Lab on 10/11/2013  Component Date Value Ref Range Status  . WBC 10/11/2013 8.1  4.0 - 10.5 K/uL Final  . RBC 10/11/2013 3.99  3.87 - 5.11 MIL/uL Final  . Hemoglobin 10/11/2013 11.5* 12.0 - 15.0 g/dL Final  . HCT 10/11/2013 35.9* 36.0 - 46.0 % Final  . MCV 10/11/2013 90.0  78.0 - 100.0 fL Final  . MCH 10/11/2013 28.8  26.0 - 34.0 pg Final  . MCHC 10/11/2013 32.0  30.0 - 36.0 g/dL Final  . RDW 10/11/2013 14.6  11.5 - 15.5 % Final  . Platelets 10/11/2013 168  150 - 400 K/uL Final  . Neutrophils Relative % 10/11/2013 75  43 - 77 % Final  . Neutro Abs 10/11/2013 6.1  1.7 - 7.7 K/uL Final  . Lymphocytes Relative 10/11/2013 17  12 - 46 % Final  . Lymphs Abs 10/11/2013 1.3  0.7 - 4.0 K/uL Final  . Monocytes Relative 10/11/2013 6  3 - 12 % Final  . Monocytes Absolute 10/11/2013 0.5  0.1 - 1.0 K/uL Final  . Eosinophils Relative 10/11/2013 2  0 - 5 % Final  . Eosinophils Absolute 10/11/2013 0.1  0.0 - 0.7 K/uL Final  . Basophils Relative 10/11/2013 0  0 - 1 % Final  . Basophils Absolute 10/11/2013 0.0  0.0 - 0.1 K/uL Final  . Sodium 10/11/2013 141  137 - 147 mEq/L Final  . Potassium 10/11/2013 3.6* 3.7 - 5.3 mEq/L Final  . Chloride 10/11/2013 101  96 - 112 mEq/L Final  . CO2 10/11/2013 29  19  - 32 mEq/L Final  . Glucose, Bld 10/11/2013 111* 70 - 99 mg/dL Final  . BUN 10/11/2013 15  6 - 23 mg/dL Final  . Creatinine, Ser 10/11/2013 1.04  0.50 - 1.10 mg/dL Final  . Calcium 10/11/2013 9.0  8.4 - 10.5 mg/dL Final  . Total Protein 10/11/2013 6.6  6.0 - 8.3 g/dL Final  . Albumin 10/11/2013 3.4* 3.5 - 5.2 g/dL Final  . AST 10/11/2013 19  0 - 37 U/L Final  . ALT 10/11/2013 15  0 - 35 U/L Final  . Alkaline Phosphatase 10/11/2013 92  39 - 117 U/L Final  . Total Bilirubin 10/11/2013 0.4  0.3 - 1.2 mg/dL Final  . GFR calc non Af Amer 10/11/2013 58* >90 mL/min Final  . GFR calc Af Amer 10/11/2013 67* >90 mL/min Final   Comment: (NOTE)                          The eGFR has been calculated using the CKD EPI equation.                          This calculation has not been validated in all clinical situations.  eGFR's persistently <90 mL/min signify possible Chronic Kidney                          Disease.  . Anion gap 10/11/2013 11  5 - 15 Final  . LDH 10/11/2013 183  94 - 250 U/L Final  . Beta-2 Microglobulin 10/11/2013 2.69* <=2.51 mg/L Final   Performed at [facility]  . Retic Ct Pct 10/11/2013 1.3  0.4 - 3.1 % Final  . RBC. 10/11/2013 3.99  3.87 - 5.11 MIL/uL Final  . Retic Count, Manual 10/11/2013 51.9  19.0 - 186.0 K/uL Final  . Angiotensin-Converting Enzyme 10/11/2013 38  8 - 52 U/L Final   Performed at [facility]  . Total Protein ELP 10/11/2013 5.9* 6.0 - 8.3 g/dL Final  . IgG (Immunoglobin G), Serum 10/11/2013 744  690 - 1700 mg/dL Final  . IgA 10/11/2013 61* 69 - 380 mg/dL Final  . IgM, Serum 10/11/2013 94  52 - 322 mg/dL Final  . Immunofix Electr Int 10/11/2013 (NOTE)   Final   Comment: No monoclonal protein identified.                          Reviewed by Odis Hollingshead, MD, PhD, FCAP (Electronic Signature on                          File)                          Performed at [facility]    PATHOLOGY:no new  pathology.  Urinalysis    Component Value Date/Time   COLORURINE YELLOW 08/15/2013 1036   APPEARANCEUR CLEAR 08/15/2013 1036   LABSPEC 1.010 08/15/2013 1036   PHURINE 6.0 08/15/2013 1036   GLUCOSEU NEGATIVE 08/15/2013 1036   HGBUR SMALL* 08/15/2013 1036   BILIRUBINUR NEGATIVE 08/15/2013 1036   KETONESUR NEGATIVE 08/15/2013 1036   PROTEINUR NEGATIVE 08/15/2013 1036   UROBILINOGEN 0.2 08/15/2013 1036   NITRITE NEGATIVE 08/15/2013 1036   LEUKOCYTESUR NEGATIVE 08/15/2013 1036    RADIOGRAPHIC STUDIES: No results found.  ASSESSMENT:  1. Diffuse large B-cell lymphoma, no evidence of disease. Stage IV B. status post R. CHOP x4 cycles (08/14/2009- 10/23/2009) along with intrathecal chemotherapy during cycles 3 and 4 with methotrexate, 12 mg and SoluCortef 50 mg. Her chemotherapy had to be stopped due to severe Pseudomonas sepsis after cycles 2 and cycles 4. These episodes were life-threatening.  2. IgG deficiency, 50 g of IVIG ever 4 weeks to prevent recurrent infections.  3. Peripheral neuropathy, chemotherapy-induced.  4. Recurrent sinus infections.  5. Allergic rhinitis and asthma, on treatment.  6. Sarcoidosis    PLAN:  #1. 50 g IV IgG today. #2. Suggested LifePort be inserted. She had had one on the left side in the past with a lighting complication in conjunction with a blood clot in her leg. I do not see a contraindication to putting one in on the right side. She is seriously considering. #3. Continue Advair inhaler 500/50. #4. Followup in one month with CBC, immunofixation, chem profile, LDH, beta-2 microglobulin, and ACE level.    All questions were answered. The patient knows to call the clinic with any problems, questions or concerns. We can certainly see the patient much sooner if necessary.   I spent 25 minutes counseling the patient face to face. The total  time spent in the appointment was 30 minutes.    Doroteo Bradford, MD 11/08/2013 9:46 AM  DISCLAIMER:   This note was dictated with voice recognition software.  Similar sounding words can inadvertently be transcribed inaccurately and may not be corrected upon review.

## 2013-11-08 NOTE — Progress Notes (Signed)
Patient tolerated infusion well.

## 2013-11-09 IMAGING — CR DG CHEST 2V
2 series · 2 of 2 positions shown · non-contrast
Comparison: 02/27/2012

CLINICAL DATA: Pneumonia.  Asthma.

CHEST - 2 VIEW

[view not recorded (1 of 2)]
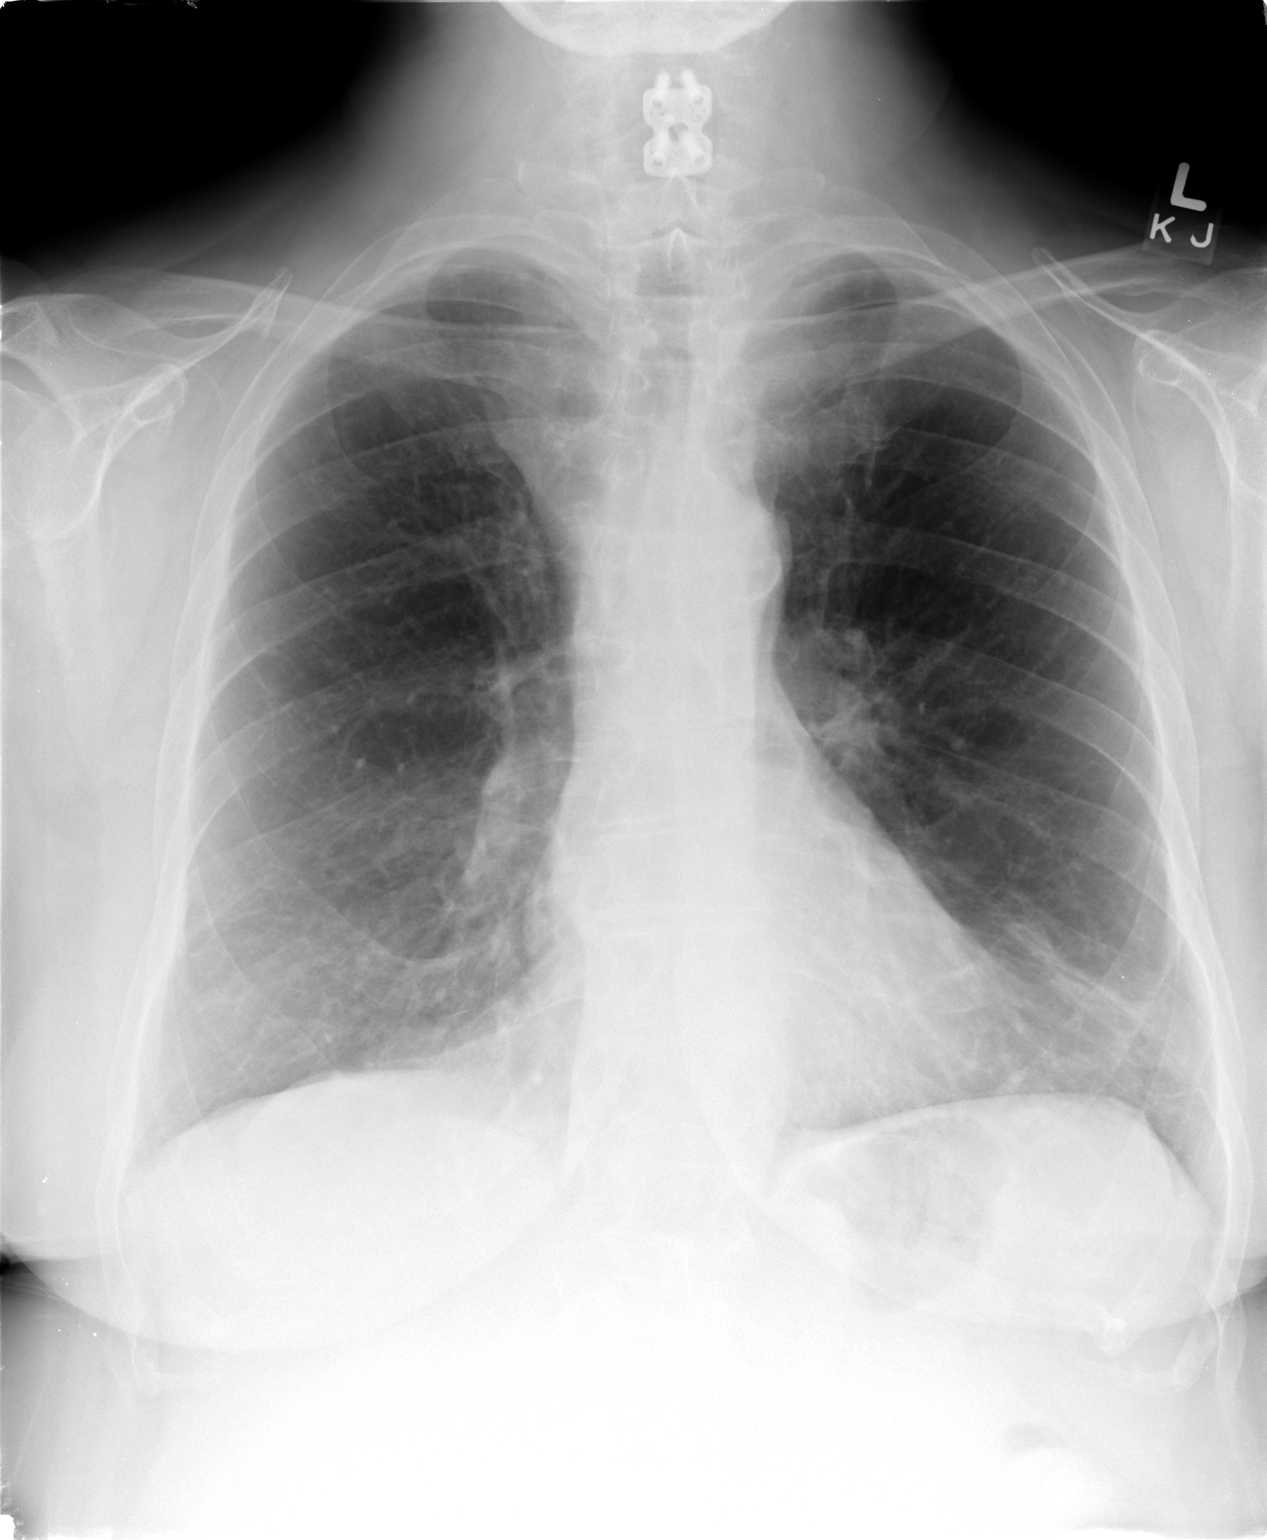

[view not recorded (2 of 2)]
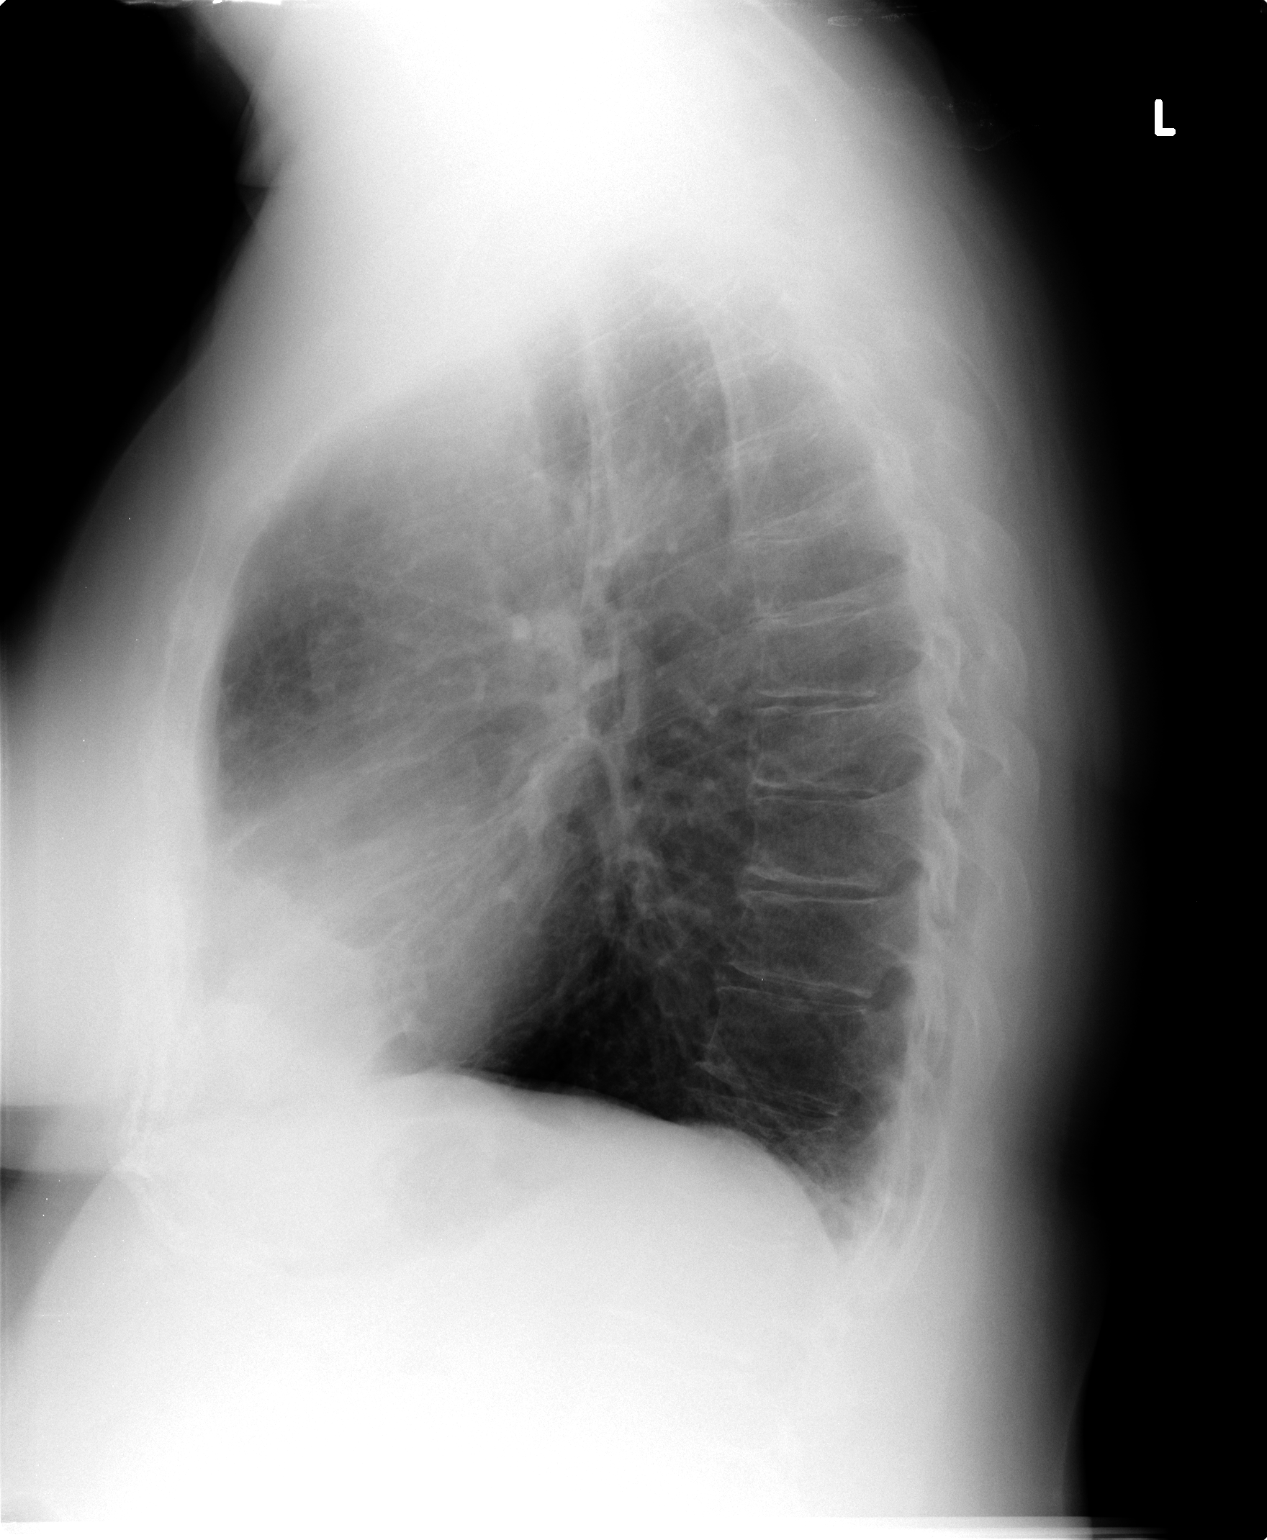

[2 of 2 positions shown; findings below may reference images not displayed]

FINDINGS: Mild linear opacity in the lingula shows no significant
change.  This could be due to atelectasis or bronchopneumonia.  No
new or worsening areas of infiltrate are seen in either lung.  No
evidence of pleural effusion.  Heart size is normal.  No mass or
lymphadenopathy identified.  Mild hyperinflation again noted,
consistent with chronic asthma or COPD.
IMPRESSION: No significant change in mild lingular atelectasis versus
bronchopneumonia.  Continued radiographic followup is suggested.

## 2013-11-11 LAB — BETA 2 MICROGLOBULIN, SERUM: Beta-2 Microglobulin: 2.51 mg/L — ABNORMAL HIGH (ref <=2.51)

## 2013-11-15 LAB — IMMUNOFIXATION ELECTROPHORESIS
IgA: 62 mg/dL — ABNORMAL LOW (ref 69–380)
IgG (Immunoglobin G), Serum: 932 mg/dL (ref 690–1700)
IgM, Serum: 114 mg/dL (ref 52–322)
Total Protein ELP: 7.1 g/dL (ref 6.0–8.3)

## 2013-11-20 ENCOUNTER — Other Ambulatory Visit (HOSPITAL_COMMUNITY): Payer: Self-pay | Admitting: Family Medicine

## 2013-11-20 DIAGNOSIS — Z1231 Encounter for screening mammogram for malignant neoplasm of breast: Secondary | ICD-10-CM

## 2013-11-25 ENCOUNTER — Ambulatory Visit (HOSPITAL_COMMUNITY): Payer: Medicare Other

## 2013-12-13 ENCOUNTER — Encounter (HOSPITAL_COMMUNITY): Payer: Self-pay

## 2013-12-13 ENCOUNTER — Encounter (HOSPITAL_COMMUNITY): Payer: Medicare Other | Attending: Hematology and Oncology

## 2013-12-13 ENCOUNTER — Encounter (HOSPITAL_BASED_OUTPATIENT_CLINIC_OR_DEPARTMENT_OTHER): Payer: Medicare Other

## 2013-12-13 ENCOUNTER — Ambulatory Visit (HOSPITAL_COMMUNITY)
Admission: RE | Admit: 2013-12-13 | Discharge: 2013-12-13 | Disposition: A | Discharge status: Home / Self Care | Payer: Medicare Other | Source: Ambulatory Visit | Attending: Family Medicine | Admitting: Family Medicine

## 2013-12-13 DIAGNOSIS — G620 Drug-induced polyneuropathy: Secondary | ICD-10-CM

## 2013-12-13 DIAGNOSIS — C8590 Non-Hodgkin lymphoma, unspecified, unspecified site: Secondary | ICD-10-CM | POA: Diagnosis present

## 2013-12-13 DIAGNOSIS — D801 Nonfamilial hypogammaglobulinemia: Secondary | ICD-10-CM

## 2013-12-13 DIAGNOSIS — N390 Urinary tract infection, site not specified: Secondary | ICD-10-CM

## 2013-12-13 DIAGNOSIS — D869 Sarcoidosis, unspecified: Secondary | ICD-10-CM

## 2013-12-13 DIAGNOSIS — D800 Hereditary hypogammaglobulinemia: Secondary | ICD-10-CM | POA: Insufficient documentation

## 2013-12-13 DIAGNOSIS — Z1231 Encounter for screening mammogram for malignant neoplasm of breast: Secondary | ICD-10-CM

## 2013-12-13 LAB — COMPREHENSIVE METABOLIC PANEL
ALT: 12 U/L (ref 0–35)
AST: 13 U/L (ref 0–37)
Albumin: 3.2 g/dL — ABNORMAL LOW (ref 3.5–5.2)
Alkaline Phosphatase: 101 U/L (ref 39–117)
Anion gap: 14 (ref 5–15)
BUN: 10 mg/dL (ref 6–23)
CALCIUM: 8.8 mg/dL (ref 8.4–10.5)
CO2: 26 meq/L (ref 19–32)
Chloride: 101 mEq/L (ref 96–112)
Creatinine, Ser: 1.08 mg/dL (ref 0.50–1.10)
GFR, EST AFRICAN AMERICAN: 64 mL/min — AB (ref >90)
GFR, EST NON AFRICAN AMERICAN: 55 mL/min — AB (ref >90)
Glucose, Bld: 139 mg/dL — ABNORMAL HIGH (ref 70–99)
Potassium: 3.4 mEq/L — ABNORMAL LOW (ref 3.7–5.3)
SODIUM: 141 meq/L (ref 137–147)
TOTAL PROTEIN: 6.8 g/dL (ref 6.0–8.3)
Total Bilirubin: 0.3 mg/dL (ref 0.3–1.2)

## 2013-12-13 LAB — RETICULOCYTES
RBC.: 3.76 MIL/uL — ABNORMAL LOW (ref 3.87–5.11)
RETIC CT PCT: 1.4 % (ref 0.4–3.1)
Retic Count, Absolute: 52.6 10*3/uL (ref 19.0–186.0)

## 2013-12-13 LAB — LACTATE DEHYDROGENASE: LDH: 188 U/L (ref 94–250)

## 2013-12-13 LAB — ANGIOTENSIN CONVERTING ENZYME: Angiotensin-Converting Enzyme: 38 U/L (ref 8–52)

## 2013-12-13 MED ORDER — IMMUNE GLOBULIN (HUMAN) 10 GM/100ML IV SOLN
50.0000 g | Freq: Once | INTRAVENOUS | Status: AC
  Administered 2013-12-13: 50 g via INTRAVENOUS
  Filled 2013-12-13: qty 100

## 2013-12-13 MED ORDER — SODIUM CHLORIDE 0.9 % IV SOLN
Freq: Once | INTRAVENOUS | Status: AC
  Administered 2013-12-13: 10:00:00 via INTRAVENOUS

## 2013-12-13 MED ORDER — ACETAMINOPHEN 325 MG PO TABS: 650.0000 mg | ORAL_TABLET | Freq: Four times a day (QID) | ORAL | Status: DC | PRN

## 2013-12-13 NOTE — Progress Notes (Signed)
LABS FOR ACE,B2M,RETIC,LDH,CMP,SIFE

## 2013-12-13 NOTE — Progress Notes (Signed)
[place]  OFFICE PROGRESS NOTE  [name], MD [place] [address]  DIAGNOSIS: No diagnosis found.  Chief Complaint  Patient presents with  . IgG deficiency  . History of large cell lymphoma  . Sarcoidosis    CURRENT THERAPY: 50 g IV IgG monthly for IgG deficiency. Currently taking Cipro for urinary tract infection per PCP.  INTERVAL HISTORY: [name] 58 y.o. female returns forfollowup of diffuse large B-cell lymphoma status post R. CHOP x4 cycles (08/14/2009- 10/23/2009) along with intrathecal chemotherapy during cycles 3 and 4 with methotrexate, 12 mg and SoluCortef 50 mg. Her chemotherapy had to be stopped due to severe Pseudomonas sepsis after cycles 2 and cycles 4. These episodes were life-threatening.  AND  IgG deficiency, 50 g of IV IGg ever 4 weeks to prevent recurrent infections. She had been on Cipro for urinary tract infection manifesting as dysuria and microscopic hematuria. Urine color has cleared. She does have chronic cough without expectoration, hemoptysis, or epistaxis. She denies any diarrhea, fever, night sweats, lower extremity swelling or redness, but does get ecchymoses on both upper extremities. She denies any abdominal pain, nausea, vomiting, diarrhea, constipation, skin rash, headache, or seizures.  MEDICAL HISTORY: Past Medical History  Diagnosis Date  . Non Hodgkin's lymphoma   . Allergic rhinitis   . GERD (gastroesophageal reflux disease)   . Asthma   . Chronic sinusitis   . Respiratory failure   . Cavitary lung disease   . PONV (postoperative nausea and vomiting)   . Endotracheally intubated   . Myocardial infarction 2011  . Anxiety   . Chronic kidney disease   . Anemia   . Pneumonia 01/2013  . 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    ALLERGIES:  is allergic to meperidine hcl and montelukast sodium.  MEDICATIONS: has a current medication list which includes the following prescription(s): acetaminophen, acyclovir, advair diskus, albuterol, albuterol, citalopram, fluticasone, gabapentin, mometasone-formoterol, omeprazole, prednisone, simvastatin, sulfamethoxazole-trimethoprim, vitamin c, warfarin, and zafirlukast, and the following Facility-Administered Medications: sodium chloride, acetaminophen, and immune globulin 10%.  SURGICAL HISTORY:  Past Surgical History  Procedure Laterality Date  . Vesicovaginal fistula closure w/ tah    . Nasal sinus surgery    . Neck surgery      x 2   . Portacath placement  7/11    Removed 6/12  . Basal cell carcinoma excision  03/2011    scalp  . Port-a-cath removal    . Peripherally inserted central catheter insertion    . Picc removal    . Tracheostomy    . Abdominal hysterectomy    . Lung lobectomy      right  . Breast surgery Right   . Colonoscopy N/A 11/14/2012    Procedure: COLONOSCOPY;  Surgeon: Rogene Houston, MD;  [facility];  Service: Endoscopy;  Laterality: N/A;  830    FAMILY HISTORY: family history includes Allergies in her father; Asthma in her father; Diabetes in her brother; Emphysema in her mother; Leukemia in her maternal grandmother; Stroke in her mother.  SOCIAL HISTORY:  reports that she has never smoked. She  has never used smokeless tobacco. She reports that she does not drink alcohol or use illicit drugs.  REVIEW OF SYSTEMS:  Other than that discussed above is noncontributory.  PHYSICAL EXAMINATION: ECOG PERFORMANCE STATUS: 1 - Symptomatic but completely  ambulatory  There were no vitals taken for this visit.  GENERAL:alert, no distress and comfortable SKIN: skin color, texture, turgor are normal, no rashes or significant lesions EYES: PERLA; Conjunctiva are pink and non-injected, sclera clear SINUSES: No redness or tenderness over maxillary or ethmoid sinuses OROPHARYNX:no exudate, no erythema on lips, buccal mucosa, or tongue. NECK: supple, thyroid normal size, non-tender, without nodularity. No masses CHEST: Increased AP diameter with no breast masses.  LYMPH:  no palpable lymphadenopathy in the cervical, axillary or inguinal LUNGS: clear to auscultation and percussion with normal breathing effort HEART: regular rate & rhythm and no murmurs. ABDOMEN:abdomen soft, non-tender and normal bowel sounds MUSCULOSKELETAL:no cyanosis of digits and no clubbing. Range of motion normal. Upper extremity ecchymoses  NEURO: alert & oriented x 3 with fluent speech, no focal motor/sensory deficits   LABORATORY DATA: Appointment on 12/13/2013  Component Date Value Ref Range Status  . Retic Ct Pct 12/13/2013 1.4  0.4 - 3.1 % Final  . RBC. 12/13/2013 3.76* 3.87 - 5.11 MIL/uL Final  . Retic Count, Manual 12/13/2013 52.6  19.0 - 186.0 K/uL Final    PATHOLOGY:No new pathology.   Urinalysis    Component Value Date/Time   COLORURINE YELLOW 08/15/2013 1036   APPEARANCEUR CLEAR 08/15/2013 1036   LABSPEC 1.010 08/15/2013 1036   PHURINE 6.0 08/15/2013 1036   GLUCOSEU NEGATIVE 08/15/2013 1036   HGBUR SMALL* 08/15/2013 1036   BILIRUBINUR NEGATIVE 08/15/2013 1036   KETONESUR NEGATIVE 08/15/2013 1036   PROTEINUR NEGATIVE 08/15/2013 1036   UROBILINOGEN 0.2 08/15/2013 1036   NITRITE NEGATIVE 08/15/2013 1036   LEUKOCYTESUR NEGATIVE 08/15/2013 1036    RADIOGRAPHIC STUDIES: No results found.  ASSESSMENT:  1. Diffuse large B-cell lymphoma, no evidence of disease. Stage IV B. status post R. CHOP x4 cycles (08/14/2009- 10/23/2009) along with intrathecal  chemotherapy during cycles 3 and 4 with methotrexate, 12 mg and SoluCortef 50 mg. Her chemotherapy had to be stopped due to severe Pseudomonas sepsis after cycles 2 and cycles 4. These episodes were life-threatening.  2. IgG deficiency, 50 g of IVIG ever 4 weeks to prevent recurrent infections.  3. Peripheral neuropathy, chemotherapy-induced.  4. Recurrent sinus infections.  5. Allergic rhinitis and asthma, on treatment.  6. Sarcoidosis   PLAN:  #1.50 g IV IgG today. #2. Suggested LifePort be inserted. She had had one on the left side in the past with a lighting complication in conjunction with a blood clot in her leg. I do not see a contraindication to putting one in on the right side. She is seriously considering. #3. Continue Advair inhaler 500/50. #4. Suggesting using short-acting inhaler prior to moving from a warm to cold or cold to warm environment to lessen the likelihood of wheezing. #4. Followup in one month with CBC, immunofixation, chem profile, LDH, beta-2 microglobulin, and ACE level  All questions were answered. The patient knows to call the clinic with any problems, questions or concerns. We can certainly see the patient much sooner if necessary.   I spent 25 minutes counseling the patient face to face. The total time spent in the appointment was 30 minutes.    Doroteo Bradford, MD 12/13/2013 9:21 AM  DISCLAIMER:  This note was dictated with voice recognition software.  Similar sounding words  can inadvertently be transcribed inaccurately and may not be corrected upon review.

## 2013-12-13 NOTE — Patient Instructions (Signed)
[facility] Discharge Instructions  RECOMMENDATIONS MADE BY THE CONSULTANT AND ANY TEST RESULTS WILL BE SENT TO YOUR REFERRING PHYSICIAN.  We will see you in 4 weeks for office visit, repeat lab work and IVIG. Please call for any questions or concerns. Follow up as scheduled.   Thank you for choosing [facility] to provide your oncology and hematology care.  To afford each patient quality time with our providers, please arrive at least 15 minutes before your scheduled appointment time.  With your help, our goal is to use those 15 minutes to complete the necessary work-up to ensure our physicians have the information they need to help with your evaluation and healthcare recommendations.    Effective January 1st, 2014, we ask that you re-schedule your appointment with our physicians should you arrive 10 or more minutes late for your appointment.  We strive to give you quality time with our providers, and arriving late affects you and other patients whose appointments are after yours.    Again, thank you for choosing [facility].  Our hope is that these requests will decrease the amount of time that you wait before being seen by our physicians.       _____________________________________________________________  Should you have questions after your visit to [facility], please contact our office at [phone] between the hours of 8:30 a.m. and 4:30 p.m.  Voicemails left after 4:30 p.m. will not be returned until the following business day.  For prescription refill requests, have your pharmacy contact our office with your prescription refill request.    _______________________________________________________________  We hope that we have given you very good care.  You may receive a patient satisfaction survey in the mail, please complete it and return it as soon as possible.  We value your  feedback!  _______________________________________________________________  Have you asked about our STAR program?  STAR stands for Survivorship Training and Rehabilitation, and this is a nationally recognized cancer care program that focuses on survivorship and rehabilitation.  Cancer and cancer treatments may cause problems, such as, pain, making you feel tired and keeping you from doing the things that you need or want to do. Cancer rehabilitation can help. Our goal is to reduce these troubling effects and help you have the best quality of life possible.  You may receive a survey from a nurse that asks questions about your current state of health.  Based on the survey results, all eligible patients will be referred to the [facility] program for an evaluation so we can better serve you!  A frequently asked questions sheet is available upon request.

## 2013-12-13 NOTE — Progress Notes (Signed)
Patient tolerated infusion well.

## 2013-12-16 LAB — BETA 2 MICROGLOBULIN, SERUM: Beta-2 Microglobulin: 2.54 mg/L — ABNORMAL HIGH (ref <=2.51)

## 2013-12-17 LAB — IMMUNOFIXATION ELECTROPHORESIS
IGM, SERUM: 85 mg/dL (ref 52–322)
IgA: 55 mg/dL — ABNORMAL LOW (ref 69–380)
IgG (Immunoglobin G), Serum: 802 mg/dL (ref 690–1700)
Total Protein ELP: 6.2 g/dL (ref 6.0–8.3)

## 2014-01-07 ENCOUNTER — Other Ambulatory Visit (HOSPITAL_COMMUNITY): Payer: Self-pay

## 2014-01-07 DIAGNOSIS — C8590 Non-Hodgkin lymphoma, unspecified, unspecified site: Secondary | ICD-10-CM

## 2014-01-09 NOTE — Progress Notes (Signed)
[name]., MD [place] [address]    Hypogammaglobulenemia/IgG deficiency  Maintenance Rituxan 09/30/2011 Hx of Diffuse large B-Cell Lymphoma, S/P R-CHOP x 4 cycles with intrathecal chemotherapy during cycles 3 and 4 with methotrexate and Solu-Cortef. Treatment was stopped due to Pseudomonas sepsis after cycles 2 and 4 which was very severe  CURRENT THERAPY:50 g IV IgG monthly for IgG deficiency.  INTERVAL HISTORY: [name] 59 y.o. female returns for IgG deficiency.  She has had low grade fevers for the past two weeks. One evening her temperature was up to 100.9. She has had some pain on the right rib region and it concerns her because it is how her cancer was when it was first found.  MEDICAL HISTORY: Past Medical History  Diagnosis Date  . Non Hodgkin's lymphoma   . Allergic rhinitis   . GERD (gastroesophageal reflux disease)   . Asthma   . Chronic sinusitis   . Respiratory failure   . Cavitary lung disease   . PONV (postoperative nausea and vomiting)   . Endotracheally intubated   . Myocardial infarction 2011  . Anxiety   . Chronic kidney disease   . Anemia   . Pneumonia 01/2013  . 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     No history exists.     is allergic to meperidine hcl and montelukast sodium.  [name] does not currently have medications on file.  SURGICAL HISTORY: Past Surgical History  Procedure Laterality Date  . Vesicovaginal fistula closure w/ tah    . Nasal sinus surgery    . Neck surgery      x 2   . Portacath placement   7/11    Removed 6/12  . Basal cell carcinoma excision  03/2011    scalp  . Port-a-cath removal    . Peripherally inserted central catheter insertion    . Picc removal    . Tracheostomy    . Abdominal hysterectomy    . Lung lobectomy      right  . Breast surgery Right   . Colonoscopy N/A 11/14/2012    Procedure: COLONOSCOPY;  Surgeon: Rogene Houston, MD;  [facility];  Service: Endoscopy;  Laterality: N/A;  830    SOCIAL HISTORY: History   Social History  . Marital Status: Divorced    Spouse Name: N/A    Number of Children: 3  . Years of Education: N/A   Occupational History  . [place] History Main Topics  . Smoking status: Never Smoker   . Smokeless tobacco: Never Used  . Alcohol Use: No  . Drug Use: No  . Sexual Activity: No   Other Topics Concern  . Not on file   Social History Narrative    FAMILY HISTORY: Family History  Problem Relation Age of Onset  . Emphysema Mother   . Allergies Father   . Asthma Father     as a child  . Leukemia Maternal Grandmother   . Diabetes Brother   . Stroke Mother     Review of Systems  Constitutional: Positive for fever and chills. Negative for weight loss,  malaise/fatigue and diaphoresis.  HENT: Negative.   Eyes: Negative.        Cataracts  Respiratory: Negative.   Cardiovascular: Negative.   Gastrointestinal: Negative.   Genitourinary: Negative.   Musculoskeletal:       Rib/upperabdominal pain  Skin: Negative.   Neurological: Negative.  Negative for weakness.  Endo/Heme/Allergies: Negative.   Psychiatric/Behavioral: Negative.     PHYSICAL EXAMINATION  ECOG PERFORMANCE STATUS: 0 - Asymptomatic  There were no vitals filed for this visit.  Physical Exam  Constitutional: She is oriented to person, place, and time and well-developed, well-nourished, and in no distress.  HENT:  Head: Normocephalic and atraumatic.  Nose: Nose normal.  Mouth/Throat: Oropharynx is clear  and moist. No oropharyngeal exudate.  Eyes: Conjunctivae and EOM are normal. Pupils are equal, round, and reactive to light. Right eye exhibits no discharge. Left eye exhibits no discharge. No scleral icterus.  Neck: Normal range of motion. Neck supple. No tracheal deviation present. No thyromegaly present.  Cardiovascular: Normal rate, regular rhythm and normal heart sounds.  Exam reveals no gallop and no friction rub.   No murmur heard. Pulmonary/Chest: Effort normal. She has wheezes. She has no rales.  Abdominal: Soft. Bowel sounds are normal. She exhibits no distension and no mass. There is no tenderness. There is no rebound and no guarding.  Musculoskeletal: Normal range of motion. She exhibits no edema.  Lymphadenopathy:    She has no cervical adenopathy.  Neurological: She is alert and oriented to person, place, and time. She has normal reflexes. No cranial nerve deficit. Gait normal. Coordination normal.  Skin: Skin is warm and dry. No rash noted.  Psychiatric: Mood, memory, affect and judgment normal.  Nursing note and vitals reviewed.   LABORATORY DATA:  CBC    Component Value Date/Time   WBC 9.4 01/10/2014 0903   RBC 3.71* 01/10/2014 0903   RBC 3.71* 01/10/2014 0903   HGB 10.3* 01/10/2014 0903   HCT 33.3* 01/10/2014 0903   PLT 194 01/10/2014 0903   MCV 89.8 01/10/2014 0903   MCH 27.8 01/10/2014 0903   MCHC 30.9 01/10/2014 0903   RDW 14.7 01/10/2014 0903   LYMPHSABS 1.0 01/10/2014 0903   MONOABS 0.5 01/10/2014 0903   EOSABS 0.2 01/10/2014 0903   BASOSABS 0.0 01/10/2014 0903   CMP     Component Value Date/Time   NA 138 01/10/2014 0903   K 3.3* 01/10/2014 0903   CL 103 01/10/2014 0903   CO2 28 01/10/2014 0903   GLUCOSE 104* 01/10/2014 0903   BUN 11 01/10/2014 0903   CREATININE 1.01 01/10/2014 0903   CALCIUM 8.5 01/10/2014 0903   PROT 6.6 01/10/2014 0903   ALBUMIN 3.3* 01/10/2014 0903   AST 16 01/10/2014 0903   ALT 13 01/10/2014 0903   ALKPHOS 88 01/10/2014  0903   BILITOT 0.4 01/10/2014 0903   GFRNONAA 60* 01/10/2014 0903   GFRAA 70* 01/10/2014 0903    FINAL DIAGNOSIS Microscopic Examination and Diagnosis  1. KIDNEY, BIOPSY, LEFT RENAL MASS BIOPSY - 18G : - HIGH GRADE LYMPHOMA. - see comment.  DATE REPORTED: 07/10/2009  Electronically Signed Out by Lyndon Code M.D., Vonna Kotyk, Pathologist, Electronic Signature   CLINICAL HISTORY  SPECIMEN(S) OBTAINED 1. Kidney, biopsy, Left Renal Mass Biopsy - 18g  SPECIMEN COMMENTS: 1. Left renal mass (gat)  Gross Description 1. Received in saline are two cores of tan soft tissue, each 1.5 x 0.1 cm which are submitted in toto in one block. (Ssw:Kv 07-08-09)  MICROSCOPIC DESCRIPTION 1. Immunohistochemical stains  were performed with the following results: Cd45 Positive Vimentin Positive Synaptophysin Negative Ema Negative Cytokeratin 7 negative Drs. John patrick and Mali rund have reviewed the case and concur with this interpretation. Overall, the findings are consistent with high grade lymphoma. Although the tissue is limited, additional immunohistochemical stains will be attempted to help specifiy phenotype.  CASE COMMENTS STAINS USED IN DIAGNOSIS: Cd45 Ck-7 Cut 4 blanks for ihc Ema Synaptophysin Vimentin Cut 4 blanks for ihc Cut 4 blanks for ihc Cut 4 blanks for ihc Universal negative control-rabbit Crg Cd 20 Cd 3 Cd 79a Cd 20 Cd 79a Universal negative control-mouse  Addendum: Additional immunohistochemical stains were performed with the following results: Cd 20 - diffusely positive Cd79a - diffusely positive Cd3 - highlights t cells A chromogranin stain is negative. Although the tissue is limited, the findings support high grade non-hodgkin's b cell lymphoma. (Jbk:Kv 07-13-09) Lyndon Code M.D., Vonna Kotyk, Pathologist, Electronic Signature ( signed 07 11 2011) Immunohistochemical stains show that the lymphoma cells are strongly cd20  positive, supporting a b cell phenotype. There are insufficient cells PRESENT on the slide stained with cd79a. (Jbk:Kv -20-11) Lyndon Code M.D., Vonna Kotyk, Pathologist, Electronic Signature ( signed 07 21 2011)   ASSESSMENT and THERAPY PLAN:    Non Hodgkin's lymphoma  Present 59 year old female with a history of diffuse large B-cell lymphoma. She received 4 cycles of R CHOP. She was treated with 2 cycles of intrathecal chemotherapy. Chemotherapy was stopped secondary to Pseudomonas sepsis. She remains without obvious recurrence. I reviewed the PET scan from June 2015 that showed a new hypermetabolic right lower lobe nodule , and CT of the chest was recommended for additional follow-up. I have arranged this for Semiyah an advisor I will see her back  After imaging is complete to review the results.   Hypogammaglobulinemia, acquired  59 year old female with hypogammaglobulinemia currently on monthly IVIG. She denies any problems with treatment.  Last IgG level was normal. Note she does have a below normal IgA level as well. I recommended continuing with current therapy. I will see her back as detailed.     All questions were answered. The patient knows to call the clinic with any problems, questions or concerns. We can certainly see the patient much sooner if necessary.   Molli Hazard 01/18/2014

## 2014-01-10 ENCOUNTER — Encounter (HOSPITAL_BASED_OUTPATIENT_CLINIC_OR_DEPARTMENT_OTHER): Payer: Medicare Other

## 2014-01-10 ENCOUNTER — Encounter (HOSPITAL_COMMUNITY): Payer: Self-pay

## 2014-01-10 ENCOUNTER — Encounter (HOSPITAL_COMMUNITY): Payer: Medicare Other | Attending: Hematology and Oncology | Admitting: Hematology & Oncology

## 2014-01-10 DIAGNOSIS — C8510 Unspecified B-cell lymphoma, unspecified site: Secondary | ICD-10-CM

## 2014-01-10 DIAGNOSIS — D801 Nonfamilial hypogammaglobulinemia: Secondary | ICD-10-CM | POA: Diagnosis present

## 2014-01-10 DIAGNOSIS — D800 Hereditary hypogammaglobulinemia: Secondary | ICD-10-CM | POA: Insufficient documentation

## 2014-01-10 DIAGNOSIS — C8330 Diffuse large B-cell lymphoma, unspecified site: Secondary | ICD-10-CM

## 2014-01-10 DIAGNOSIS — C8590 Non-Hodgkin lymphoma, unspecified, unspecified site: Secondary | ICD-10-CM | POA: Diagnosis present

## 2014-01-10 DIAGNOSIS — D839 Common variable immunodeficiency, unspecified: Secondary | ICD-10-CM | POA: Diagnosis not present

## 2014-01-10 DIAGNOSIS — R911 Solitary pulmonary nodule: Secondary | ICD-10-CM

## 2014-01-10 DIAGNOSIS — D869 Sarcoidosis, unspecified: Secondary | ICD-10-CM | POA: Diagnosis present

## 2014-01-10 LAB — CBC WITH DIFFERENTIAL/PLATELET
BASOS ABS: 0 10*3/uL (ref 0.0–0.1)
Basophils Relative: 0 % (ref 0–1)
Eosinophils Absolute: 0.2 10*3/uL (ref 0.0–0.7)
Eosinophils Relative: 3 % (ref 0–5)
HEMATOCRIT: 33.3 % — AB (ref 36.0–46.0)
HEMOGLOBIN: 10.3 g/dL — AB (ref 12.0–15.0)
Lymphocytes Relative: 11 % — ABNORMAL LOW (ref 12–46)
Lymphs Abs: 1 10*3/uL (ref 0.7–4.0)
MCH: 27.8 pg (ref 26.0–34.0)
MCHC: 30.9 g/dL (ref 30.0–36.0)
MCV: 89.8 fL (ref 78.0–100.0)
Monocytes Absolute: 0.5 10*3/uL (ref 0.1–1.0)
Monocytes Relative: 6 % (ref 3–12)
Neutro Abs: 7.6 10*3/uL (ref 1.7–7.7)
Neutrophils Relative %: 80 % — ABNORMAL HIGH (ref 43–77)
PLATELETS: 194 10*3/uL (ref 150–400)
RBC: 3.71 MIL/uL — ABNORMAL LOW (ref 3.87–5.11)
RDW: 14.7 % (ref 11.5–15.5)
WBC: 9.4 10*3/uL (ref 4.0–10.5)

## 2014-01-10 LAB — COMPREHENSIVE METABOLIC PANEL
ALBUMIN: 3.3 g/dL — AB (ref 3.5–5.2)
ALK PHOS: 88 U/L (ref 39–117)
ALT: 13 U/L (ref 0–35)
AST: 16 U/L (ref 0–37)
Anion gap: 7 (ref 5–15)
BUN: 11 mg/dL (ref 6–23)
CO2: 28 mmol/L (ref 19–32)
Calcium: 8.5 mg/dL (ref 8.4–10.5)
Chloride: 103 mEq/L (ref 96–112)
Creatinine, Ser: 1.01 mg/dL (ref 0.50–1.10)
GFR calc non Af Amer: 60 mL/min — ABNORMAL LOW (ref >90)
GFR, EST AFRICAN AMERICAN: 70 mL/min — AB (ref >90)
GLUCOSE: 104 mg/dL — AB (ref 70–99)
POTASSIUM: 3.3 mmol/L — AB (ref 3.5–5.1)
Sodium: 138 mmol/L (ref 135–145)
Total Bilirubin: 0.4 mg/dL (ref 0.3–1.2)
Total Protein: 6.6 g/dL (ref 6.0–8.3)

## 2014-01-10 LAB — ANGIOTENSIN CONVERTING ENZYME: ANGIOTENSIN-CONVERTING ENZYME: 38 U/L (ref 8–52)

## 2014-01-10 LAB — RETICULOCYTES
RBC.: 3.71 MIL/uL — ABNORMAL LOW (ref 3.87–5.11)
Retic Count, Absolute: 44.5 10*3/uL (ref 19.0–186.0)
Retic Ct Pct: 1.2 % (ref 0.4–3.1)

## 2014-01-10 LAB — LACTATE DEHYDROGENASE: LDH: 146 U/L (ref 94–250)

## 2014-01-10 MED ORDER — SODIUM CHLORIDE 0.9 % IV SOLN
INTRAVENOUS | Status: DC
  Administered 2014-01-10: 11:00:00 via INTRAVENOUS

## 2014-01-10 MED ORDER — DIPHENHYDRAMINE HCL 25 MG PO CAPS: 25.0000 mg | ORAL_CAPSULE | Freq: Once | ORAL | Status: DC

## 2014-01-10 MED ORDER — IMMUNE GLOBULIN (HUMAN) 10 GM/100ML IV SOLN
50.0000 g | Freq: Once | INTRAVENOUS | Status: AC
  Administered 2014-01-10: 50 g via INTRAVENOUS
  Filled 2014-01-10: qty 400

## 2014-01-10 NOTE — Patient Instructions (Signed)
[place] Discharge Instructions  RECOMMENDATIONS MADE BY THE CONSULTANT AND ANY TEST RESULTS WILL BE SENT TO YOUR REFERRING PHYSICIAN.  Please keep a diary of your temperatures. Bring it to your next appointment We are going to order a CT of the chest to follow up on your symptoms and prior PET. Please call in the interim with any problems or concerns.   Thank you for choosing [place] at [facility] to  provide your oncology and hematology care. To afford each patient quality  time with our provider, please arrive at least 15 minutes before your  scheduled appointment time.  You need to re-schedule your appointment should you arrive 10 or more  minutes late. We strive to give you quality time with our providers, and  arriving late affects you and other patients whose appointments are after  yours. Also, if you no show three or more times for appointments you may  be dismissed from the clinic at the providers discretion.  Again, thank you for choosing [facility]. Our hope is that  these requests will decrease the amount of time that you wait before being seen by our physicians. _______________________________________________________________  Should you have questions after your visit to [facility], please contact our office at [phone] between the hours of 8:30 a.m. and 5:00 p.m.  Voicemails left after 4:30 p.m. will not be returned until the following business day.  For prescription refill requests, have your pharmacy contact our office with your prescription refill request.

## 2014-01-10 NOTE — Progress Notes (Signed)
[name] Tolerated IVIG infusion well today

## 2014-01-10 NOTE — Patient Instructions (Signed)
[facility] Discharge Instructions for Patients Receiving Chemotherapy  Today you received the following chemotherapy agents IVIG Please follow up as scheduled, call the clinic if you have any questions or concerns  To help prevent nausea and vomiting after your treatment, we encourage you to take your nausea medication  If you develop nausea and vomiting that is not controlled by your nausea medication, call the clinic. If it is after clinic hours your family physician or the after hours number for the clinic or go to the Emergency Department.   BELOW ARE SYMPTOMS THAT SHOULD BE REPORTED IMMEDIATELY:  *FEVER GREATER THAN 101.0 F  *CHILLS WITH OR WITHOUT FEVER  NAUSEA AND VOMITING THAT IS NOT CONTROLLED WITH YOUR NAUSEA MEDICATION  *UNUSUAL SHORTNESS OF BREATH  *UNUSUAL BRUISING OR BLEEDING  TENDERNESS IN MOUTH AND THROAT WITH OR WITHOUT PRESENCE OF ULCERS  *URINARY PROBLEMS  *BOWEL PROBLEMS  UNUSUAL RASH Items with * indicate a potential emergency and should be followed up as soon as possible.  One of the nurses will contact you 24 hours after your treatment. Please let the nurse know about any problems that you may have experienced. Feel free to call the clinic you have any questions or concerns. The clinic phone number is [phone].   I have been informed and understand all the instructions given to me. I know to contact the clinic, my physician, or go to the Emergency Department if any problems should occur. I do not have any questions at this time, but understand that I may call the clinic during office hours or the Patient Navigator at [phone] should I have any questions or need assistance in obtaining follow up care.    Immune Globulin Injection What is this medicine? IMMUNE GLOBULIN (im MUNE GLOB yoo lin) helps to prevent or reduce the severity of certain infections in patients who are at risk. This medicine is collected from the pooled blood  of many donors. It is used to treat immune system problems, thrombocytopenia, and Kawasaki syndrome. This medicine may be used for other purposes; ask your health care provider or pharmacist if you have questions. COMMON BRAND NAME(S): Baygam, BIVIGAM, Carimune, Carimune NF, Flebogamma, Flebogamma DIF, GamaSTAN S/D, Gamimune N, Gammagard S/D, Gammaked, Gammaplex, Gammar-P IV, Gamunex, Gamunex-C, Hizentra, Iveegam, Iveegam EN, Octagam, Panglobulin, Panglobulin NF, Polygam S/D, Privigen, Sandoglobulin, Venoglobulin-S, Vigam, Vivaglobulin What should I tell my health care provider before I take this medicine? They need to know if you have any of these conditions: - diabetes - extremely low or no immune antibodies in the blood - heart disease - history of blood clots - hyperprolinemia - infection in the blood, sepsis - kidney disease - taking medicine that may change kidney function - ask your health care provider about your medicine - an unusual or allergic reaction to human immune globulin, albumin, maltose, sucrose, polysorbate 80, other medicines, foods, dyes, or preservatives - pregnant or trying to get pregnant - breast-feeding How should I use this medicine? This medicine is for injection into a muscle or infusion into a vein or skin. It is usually given by a health care professional in a hospital or clinic setting. In rare cases, some brands of this medicine might be given at home. You will be taught how to give this medicine. Use exactly as directed. Take your medicine at regular intervals. Do not take your medicine more often than directed. Talk to your pediatrician regarding the use of this medicine in children. Special care may  be needed. Overdosage: If you think you have taken too much of this medicine contact a poison control center or emergency room at once. NOTE: This medicine is only for you. Do not share this medicine with others. What if I miss a dose? It is important  not to miss your dose. Call your doctor or health care professional if you are unable to keep an appointment. If you give yourself the medicine and you miss a dose, take it as soon as you can. If it is almost time for your next dose, take only that dose. Do not take double or extra doses. What may interact with this medicine? -aspirin and aspirin-like medicines -cisplatin -cyclosporine -medicines for infection like acyclovir, adefovir, amphotericin B, bacitracin, cidofovir, foscarnet, ganciclovir, gentamicin, pentamidine, vancomycin -NSAIDS, medicines for pain and inflammation, like ibuprofen or naproxen -pamidronate -vaccines -zoledronic acid This list may not describe all possible interactions. Give your health care provider a list of all the medicines, herbs, non-prescription drugs, or dietary supplements you use. Also tell them if you smoke, drink alcohol, or use illegal drugs. Some items may interact with your medicine. What should I watch for while using this medicine? Your condition will be monitored carefully while you are receiving this medicine. This medicine is made from pooled blood donations of many different people. It may be possible to pass an infection in this medicine. However, the donors are screened for infections and all products are tested for HIV and hepatitis. The medicine is treated to kill most or all bacteria and viruses. Talk to your doctor about the risks and benefits of this medicine. Do not have vaccinations for at least 14 days before, or until at least 3 months after receiving this medicine. What side effects may I notice from receiving this medicine? Side effects that you should report to your doctor or health care professional as soon as possible: -allergic reactions like skin rash, itching or hives, swelling of the face, lips, or tongue -breathing problems -chest pain or tightness -fever, chills -headache with nausea, vomiting -neck pain or difficulty moving  neck -pain when moving eyes -pain, swelling, warmth in the leg -problems with balance, talking, walking -sudden weight gain -swelling of the ankles, feet, hands -trouble passing urine or change in the amount of urine Side effects that usually do not require medical attention (report to your doctor or health care professional if they continue or are bothersome): -dizzy, drowsy -flushing -increased sweating -leg cramps -muscle aches and pains -pain at site where injected This list may not describe all possible side effects. Call your doctor for medical advice about side effects. You may report side effects to FDA at 1-800-FDA-1088. Where should I keep my medicine? Keep out of the reach of children. This drug is usually given in a hospital or clinic and will not be stored at home. In rare cases, some brands of this medicine may be given at home. If you are using this medicine at home, you will be instructed on how to store this medicine. Throw away any unused medicine after the expiration date on the label. NOTE: This sheet is a summary. It may not cover all possible information. If you have questions about this medicine, talk to your doctor, pharmacist, or health care provider.  2015, Elsevier/Gold Standard. (2008-03-12 11:44:49)

## 2014-01-10 NOTE — Progress Notes (Signed)
LABS FOR AICE,B2M,CBCD,CMP,LDH,RET,SIFX

## 2014-01-13 LAB — BETA 2 MICROGLOBULIN, SERUM: BETA 2 MICROGLOBULIN: 2.41 mg/L — AB (ref <=2.51)

## 2014-01-14 LAB — IMMUNOFIXATION ELECTROPHORESIS
IGG (IMMUNOGLOBIN G), SERUM: 759 mg/dL (ref 690–1700)
IgA: 54 mg/dL — ABNORMAL LOW (ref 69–380)
IgM, Serum: 80 mg/dL (ref 52–322)
Total Protein ELP: 5.8 g/dL — ABNORMAL LOW (ref 6.0–8.3)

## 2014-01-16 ENCOUNTER — Ambulatory Visit (HOSPITAL_COMMUNITY)
Admission: RE | Admit: 2014-01-16 | Discharge: 2014-01-16 | Disposition: A | Discharge status: Home / Self Care | Payer: Medicare Other | Source: Ambulatory Visit | Attending: Hematology & Oncology | Admitting: Hematology & Oncology

## 2014-01-16 DIAGNOSIS — Z8572 Personal history of non-Hodgkin lymphomas: Secondary | ICD-10-CM | POA: Diagnosis not present

## 2014-01-16 DIAGNOSIS — R911 Solitary pulmonary nodule: Secondary | ICD-10-CM | POA: Diagnosis not present

## 2014-01-18 ENCOUNTER — Encounter (HOSPITAL_COMMUNITY): Payer: Self-pay | Admitting: Hematology & Oncology

## 2014-01-18 NOTE — Assessment & Plan Note (Signed)
Present 59 year old female with a history of diffuse large B-cell lymphoma. She received 4 cycles of R CHOP. She was treated with 2 cycles of intrathecal chemotherapy. Chemotherapy was stopped secondary to Pseudomonas sepsis. She remains without obvious recurrence. I reviewed the PET scan from June 2015 that showed a new hypermetabolic right lower lobe nodule , and CT of the chest was recommended for additional follow-up. I have arranged this for [name] an advisor I will see her back  After imaging is complete to review the results.

## 2014-01-18 NOTE — Assessment & Plan Note (Signed)
59 year old female with hypogammaglobulinemia currently on monthly IVIG. She denies any problems with treatment.  Last IgG level was normal. Note she does have a below normal IgA level as well. I recommended continuing with current therapy. I will see her back as detailed.

## 2014-01-24 ENCOUNTER — Ambulatory Visit (HOSPITAL_COMMUNITY): Payer: Medicare Other | Admitting: Hematology & Oncology

## 2014-01-31 ENCOUNTER — Encounter (HOSPITAL_BASED_OUTPATIENT_CLINIC_OR_DEPARTMENT_OTHER): Payer: Medicare Other | Admitting: Hematology & Oncology

## 2014-01-31 ENCOUNTER — Encounter (HOSPITAL_COMMUNITY): Payer: Self-pay | Admitting: Hematology & Oncology

## 2014-01-31 VITALS — BP 102/73 | HR 78 | Temp 98.1°F | Resp 18 | Wt 173.0 lb

## 2014-01-31 DIAGNOSIS — R9389 Abnormal findings on diagnostic imaging of other specified body structures: Secondary | ICD-10-CM

## 2014-01-31 DIAGNOSIS — R938 Abnormal findings on diagnostic imaging of other specified body structures: Secondary | ICD-10-CM

## 2014-01-31 DIAGNOSIS — D839 Common variable immunodeficiency, unspecified: Secondary | ICD-10-CM

## 2014-01-31 DIAGNOSIS — C8590 Non-Hodgkin lymphoma, unspecified, unspecified site: Secondary | ICD-10-CM

## 2014-01-31 DIAGNOSIS — D801 Nonfamilial hypogammaglobulinemia: Secondary | ICD-10-CM

## 2014-01-31 DIAGNOSIS — D649 Anemia, unspecified: Secondary | ICD-10-CM

## 2014-01-31 MED ORDER — LEVOFLOXACIN 500 MG PO TABS: 500.0000 mg | ORAL_TABLET | Freq: Every day | ORAL | Status: DC

## 2014-01-31 NOTE — Patient Instructions (Signed)
[place] at [facility]  Discharge Instructions:  I will call you in a prescription for levaquin, please complete 14 days Please call in the interim with any problems or concerns or if you are not improving. I will see you back in 1 month _______________________________________________________________  Thank you for choosing [place] at [facility] to provide your oncology and hematology care.  To afford each patient quality time with our providers, please arrive at least 15 minutes before your scheduled appointment.  You need to re-schedule your appointment if you arrive 10 or more minutes late.  We strive to give you quality time with our providers, and arriving late affects you and other patients whose appointments are after yours.  Also, if you no show three or more times for appointments you may be dismissed from the clinic.  Again, thank you for choosing [place] at [place] hope is that these requests will allow you access to exceptional care and in a timely manner. _______________________________________________________________  If you have questions after your visit, please contact our office at [phone] between the hours of 8:30 a.m. and 5:00 p.m. Voicemails left after 4:30 p.m. will not be returned until the following business day. _______________________________________________________________  For prescription refill requests, have your pharmacy contact our office. _______________________________________________________________  Recommendations made by the consultant and any test results will be sent to your referring physician. _______________________________________________________________

## 2014-01-31 NOTE — Progress Notes (Signed)
[name], MD [place] [id number]    Hypogammaglobulenemia/IgG deficiency  Maintenance Rituxan 09/30/2011 Hx of Diffuse large B-Cell Lymphoma, S/P R-CHOP x 4 cycles with intrathecal chemotherapy during cycles 3 and 4 with methotrexate and Solu-Cortef. Treatment was stopped due to Pseudomonas sepsis after cycles 2 and 4 which was very severe  CURRENT THERAPY:50 g IV IgG monthly for IgG deficiency.  INTERVAL [name]: [name] 59 y.o. female returns for IgG deficiency.  She has had low grade fevers for the past two weeks. One evening her temperature was up to 100.9. She has had some pain on the right rib region and it concerns her because it is how her cancer was when it was first found.  Her cough is worse, she is here to review her CT scans.  MEDICAL [name]: Past Medical [name]  Diagnosis Date  . Non Hodgkin's lymphoma   . Allergic rhinitis   . GERD (gastroesophageal reflux disease)   . Asthma   . Chronic sinusitis   . Respiratory failure   . Cavitary lung disease   . PONV (postoperative nausea and vomiting)   . Endotracheally intubated   . Myocardial infarction 2011  . Anxiety   . Chronic kidney disease   . Anemia   . Pneumonia 01/2013  . 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     No [name] exists.     is allergic to meperidine hcl and montelukast sodium.  Ms. Cornwall does not currently have medications on file.  SURGICAL [name]: Past Surgical [name]  Procedure Laterality Date  . Vesicovaginal fistula  closure w/ tah    . Nasal sinus surgery    . Neck surgery      x 2   . Portacath placement  7/11    Removed 6/12  . Basal cell carcinoma excision  03/2011    scalp  . Port-a-cath removal    . Peripherally inserted central catheter insertion    . Picc removal    . Tracheostomy    . Abdominal hysterectomy    . Lung lobectomy      right  . Breast surgery Right   . Colonoscopy N/A 11/14/2012    Procedure: COLONOSCOPY;  Surgeon: Rogene Houston, MD;  Location: [facility];  Service: Endoscopy;  Laterality: N/A;  830    SOCIAL [name]: [name]   Social [name]  . Marital Status: Divorced    Spouse Name: N/A    Number of Children: 3  . Years of Education: N/A   Occupational [name]  . [place] [name] Main Topics  . Smoking status: Never Smoker   . Smokeless tobacco: Never Used  . Alcohol Use: No  . Drug Use: No  . Sexual Activity: No   Other Topics Concern  . Not on file   Social [name] Narrative    [name]: [name]  Problem Relation Age of Onset  . Emphysema Mother   . Allergies Father   . Asthma Father     as a child  . Leukemia Maternal Grandmother   . Diabetes Brother   . Stroke Mother  Review of Systems  Constitutional: Positive for fever and malaise/fatigue.  HENT: Negative.   Eyes: Negative.   Respiratory: Positive for cough.   Cardiovascular: Negative.   Gastrointestinal: Negative.   Musculoskeletal:       Right sided rib pain  Skin: Negative.   Neurological: Negative.   Endo/Heme/Allergies: Negative.   Psychiatric/Behavioral: Negative.     PHYSICAL EXAMINATION  ECOG PERFORMANCE STATUS: 1 - Symptomatic but completely ambulatory  Filed Vitals:   01/31/14 0919  BP: 102/73  Pulse: 78  Temp: 98.1 F (36.7 C)  Resp: 18    Physical Exam  Constitutional: She is oriented to person, place, and time and well-developed, well-nourished, and in no distress.  HENT:  Head: Normocephalic and  atraumatic.  Nose: Nose normal.  Mouth/Throat: Oropharynx is clear and moist. No oropharyngeal exudate.  Eyes: Conjunctivae and EOM are normal. Pupils are equal, round, and reactive to light. Right eye exhibits no discharge. Left eye exhibits no discharge. No scleral icterus.  Neck: Normal range of motion. Neck supple. No tracheal deviation present. No thyromegaly present.  Cardiovascular: Normal rate, regular rhythm and normal heart sounds.  Exam reveals no gallop and no friction rub.   No murmur heard. Pulmonary/Chest: Effort normal. She has wheezes. She has no rales.  Abdominal: Soft. Bowel sounds are normal. She exhibits no distension and no mass. There is no tenderness. There is no rebound and no guarding.  Musculoskeletal: Normal range of motion. She exhibits no edema.  Lymphadenopathy:    She has no cervical adenopathy.  Neurological: She is alert and oriented to person, place, and time. She has normal reflexes. No cranial nerve deficit. Gait normal. Coordination normal.  Skin: Skin is warm and dry. No rash noted.  Psychiatric: Mood, memory, affect and judgment normal.  Nursing note and vitals reviewed.   LABORATORY DATA:  CBC    Component Value Date/Time   WBC 9.4 01/10/2014 0903   RBC 3.71* 01/10/2014 0903   RBC 3.71* 01/10/2014 0903   HGB 10.3* 01/10/2014 0903   HCT 33.3* 01/10/2014 0903   PLT 194 01/10/2014 0903   MCV 89.8 01/10/2014 0903   MCH 27.8 01/10/2014 0903   MCHC 30.9 01/10/2014 0903   RDW 14.7 01/10/2014 0903   LYMPHSABS 1.0 01/10/2014 0903   MONOABS 0.5 01/10/2014 0903   EOSABS 0.2 01/10/2014 0903   BASOSABS 0.0 01/10/2014 0903   CMP     Component Value Date/Time   NA 138 01/10/2014 0903   K 3.3* 01/10/2014 0903   CL 103 01/10/2014 0903   CO2 28 01/10/2014 0903   GLUCOSE 104* 01/10/2014 0903   BUN 11 01/10/2014 0903   CREATININE 1.01 01/10/2014 0903   CALCIUM 8.5 01/10/2014 0903   PROT 6.6 01/10/2014 0903   ALBUMIN 3.3* 01/10/2014 0903   AST 16  01/10/2014 0903   ALT 13 01/10/2014 0903   ALKPHOS 88 01/10/2014 0903   BILITOT 0.4 01/10/2014 0903   GFRNONAA 60* 01/10/2014 0903   GFRAA 70* 01/10/2014 0903    FINAL DIAGNOSIS Microscopic Examination and Diagnosis  1. KIDNEY, BIOPSY, LEFT RENAL MASS BIOPSY - 18G : - HIGH GRADE LYMPHOMA. - see comment.  DATE REPORTED: 07/10/2009  Electronically Signed Out by Lyndon Code M.D., Vonna Kotyk, Pathologist, Electronic Signature   CLINICAL [name]  SPECIMEN(S) OBTAINED 1. Kidney, biopsy, Left Renal Mass Biopsy - 18g  SPECIMEN COMMENTS: 1. Left renal mass (gat)  Gross Description 1. Received in saline are two cores of tan soft tissue, each 1.5 x 0.1 cm which  are submitted in toto in one block. (Ssw:Kv 07-08-09)  MICROSCOPIC DESCRIPTION 1. Immunohistochemical stains were performed with the following results: Cd45 Positive Vimentin Positive Synaptophysin Negative Ema Negative Cytokeratin 7 negative Drs. John patrick and Mali rund have reviewed the case and concur with this interpretation. Overall, the findings are consistent with high grade lymphoma. Although the tissue is limited, additional immunohistochemical stains will be attempted to help specifiy phenotype.  CASE COMMENTS STAINS USED IN DIAGNOSIS: Cd45 Ck-7 Cut 4 blanks for ihc Ema Synaptophysin Vimentin Cut 4 blanks for ihc Cut 4 blanks for ihc Cut 4 blanks for ihc Universal negative control-rabbit Crg Cd 20 Cd 3 Cd 79a Cd 20 Cd 79a Universal negative control-mouse  Addendum: Additional immunohistochemical stains were performed with the following results: Cd 20 - diffusely positive Cd79a - diffusely positive Cd3 - highlights t cells A chromogranin stain is negative. Although the tissue is limited, the findings support high grade non-hodgkin's b cell lymphoma. (Jbk:Kv 07-13-09) Lyndon Code M.D., Vonna Kotyk, Pathologist, Electronic Signature ( signed 07 11  2011) Immunohistochemical stains show that the lymphoma cells are strongly cd20 positive, supporting a b cell phenotype. There are insufficient cells PRESENT on the slide stained with cd79a. (Jbk:Kv -20-11) Kish M.D., Vonna Kotyk, Pathologist, Electronic Signature ( signed 07 21 2011)  CLINICAL DATA: Subsequent evaluation of left lung nodule lower lobe, personal [name] of non-Hodgkin's lymphoma  RADIOLOGY:  EXAM: CT CHEST WITHOUT CONTRAST  TECHNIQUE: Multidetector CT imaging of the chest was performed following the standard protocol without IV contrast.  COMPARISON: 06/24/2013 PET scan  FINDINGS: Stable mild bilateral lower lobe bronchitic change. In the medial right lower lobe there is mild reticular nodular infiltrate, new from prior study. Mild linear densities lateral right upper lobe image number 26, likely representing scarring as it is not significantly changed.  Subsegmental atelectasis inferior lateral lingula similar to prior study. The previously described rounded opacity in the left lower lobe that measured 8 x 8 mm is not seen current study. Mild right lung base scarring and atelectasis is very similar to the prior study.  Thoracic inlet and thyroid are normal. Small mediastinal lymph nodes, none of which are pathologic by size, not significantly different from prior study. No pleural or pericardial effusion. No acute musculoskeletal findings.  IMPRESSION: 1. Interval resolution of 8 mm pulmonary nodule left lower lobe of 2. Interval development of mild reticular nodular infiltrate medially in the right lower lobe seen on images 37 through 42. This could represent infectious or inflammatory etiology, although malignancy including lymphangitic tumor spread is not excluded.   Electronically Signed  By: Skipper Cliche M.D.  On: 01/16/2014 10:53   ASSESSMENT and THERAPY PLAN:    Abnormal finding on imaging Pleasant 59 year old female with  a [name] of diffuse large B-cell lymphoma, and hypogammaglobulinemia. She has had low-grade fevers for several weeks, with a temperature spike to 100.9 several evenings ago. She is also having some right rib pain that is new and concerning. CT imaging of the chest was performed and shows the development of mild reticular nodular infiltrate medially in the right lower lobe. I discussed with her that this may be an infectious process especially given her recent fevers and discomfort, I have recommended a 14 day course of Levaquin. I have advised her if her symptoms do not improve or worsen over the next week or 2 to please call me for sooner follow-up. But I will tentatively schedule her for follow-up in 4 weeks prior to her next dose of IVIG.  Hypogammaglobulinemia, acquired D30-year-old female with a [name] of hypogammaglobulinemia who is currently on IVIG. I have recommended ongoing IVIG therapy and we will plan on seeing her back next month prior to her next dose.    The patient goes to [facility] for her INRs. I have advised her to call them and let them know she will be starting Levaquin. I advised her that Levaquin may interfere with her Coumadin and increase her INRs. She understands and will follow-up with [facility].   All questions were answered. The patient knows to call the clinic with any problems, questions or concerns. We can certainly see the patient much sooner if necessary.   Molli Hazard 02/11/2014

## 2014-02-07 ENCOUNTER — Ambulatory Visit (HOSPITAL_COMMUNITY): Payer: Medicare Other

## 2014-02-07 ENCOUNTER — Other Ambulatory Visit (HOSPITAL_COMMUNITY): Payer: Medicare Other

## 2014-02-07 ENCOUNTER — Ambulatory Visit (HOSPITAL_COMMUNITY): Payer: Medicare Other | Admitting: Hematology & Oncology

## 2014-02-07 IMAGING — CR DG CHEST 2V
2 series · 2 of 2 positions shown · non-contrast
Comparison: 03/19/2012

CLINICAL DATA: Shortness of breath.  Asthma.  Non-Hodgkins lymphoma

CHEST - 2 VIEW

[view not recorded (1 of 2)]
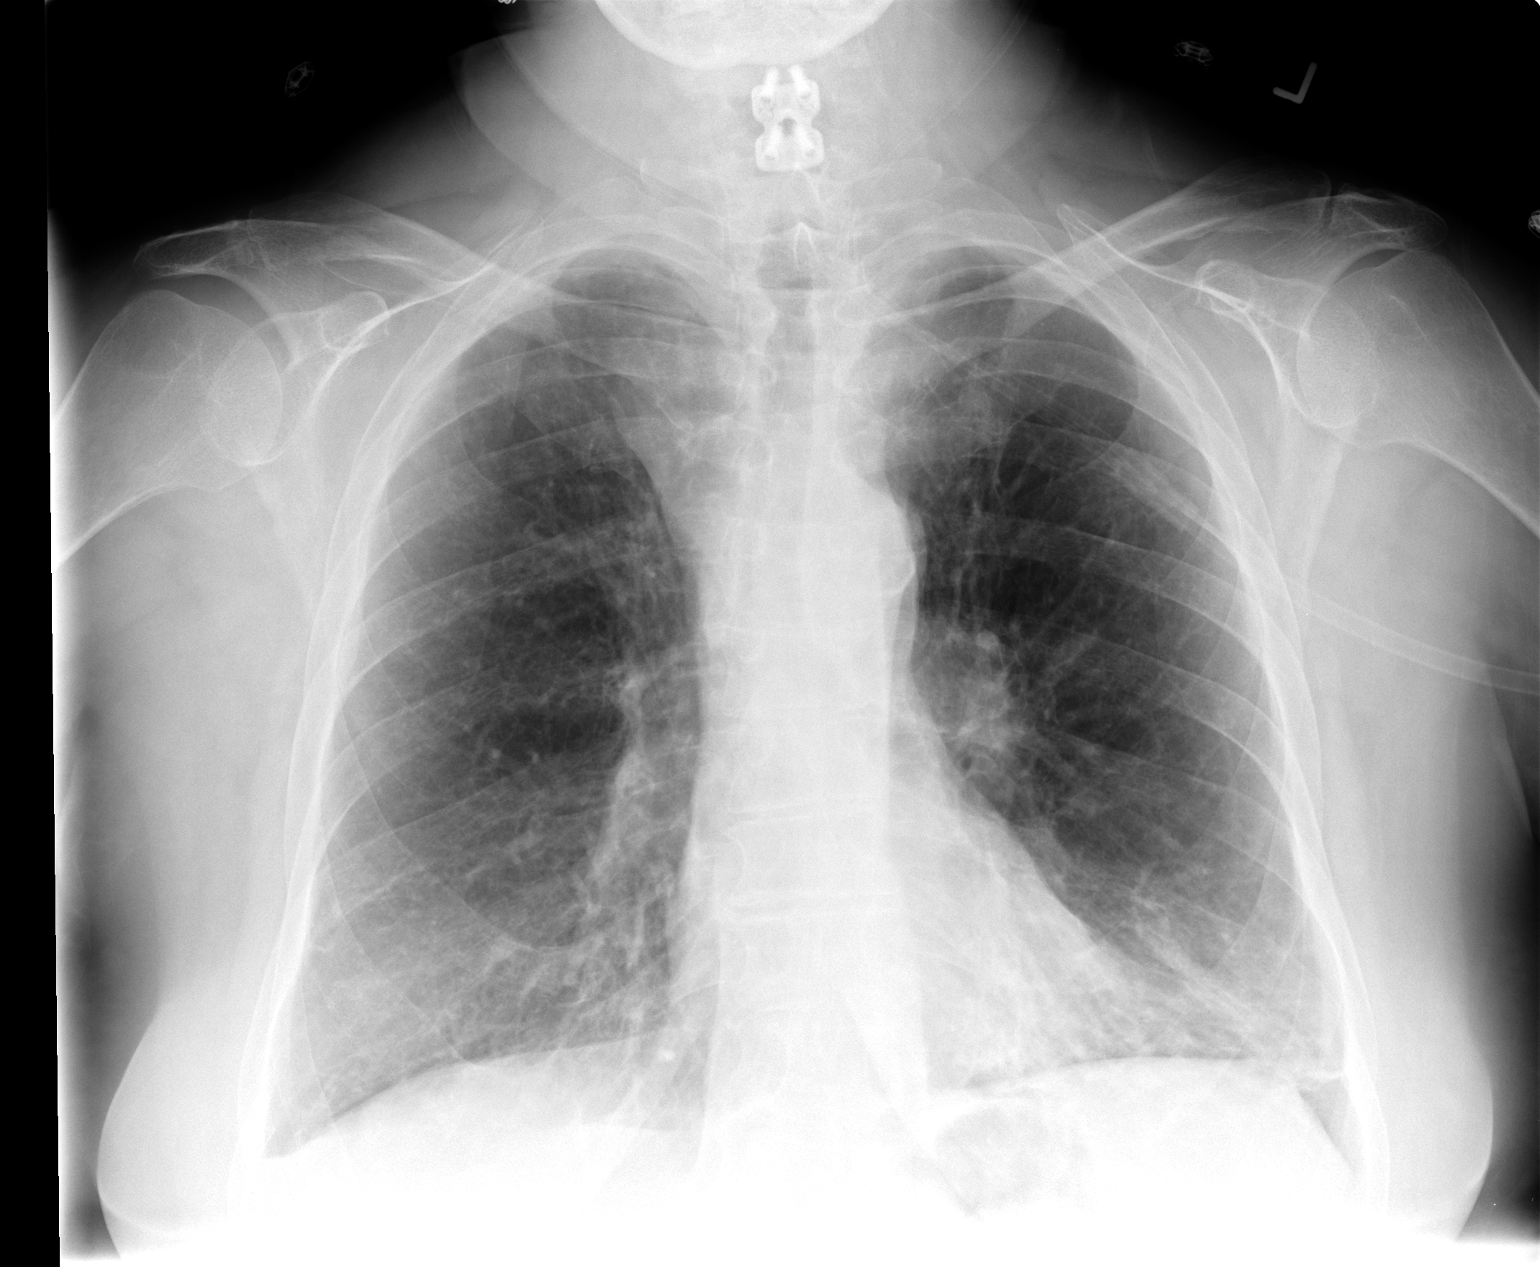

[view not recorded (2 of 2)]
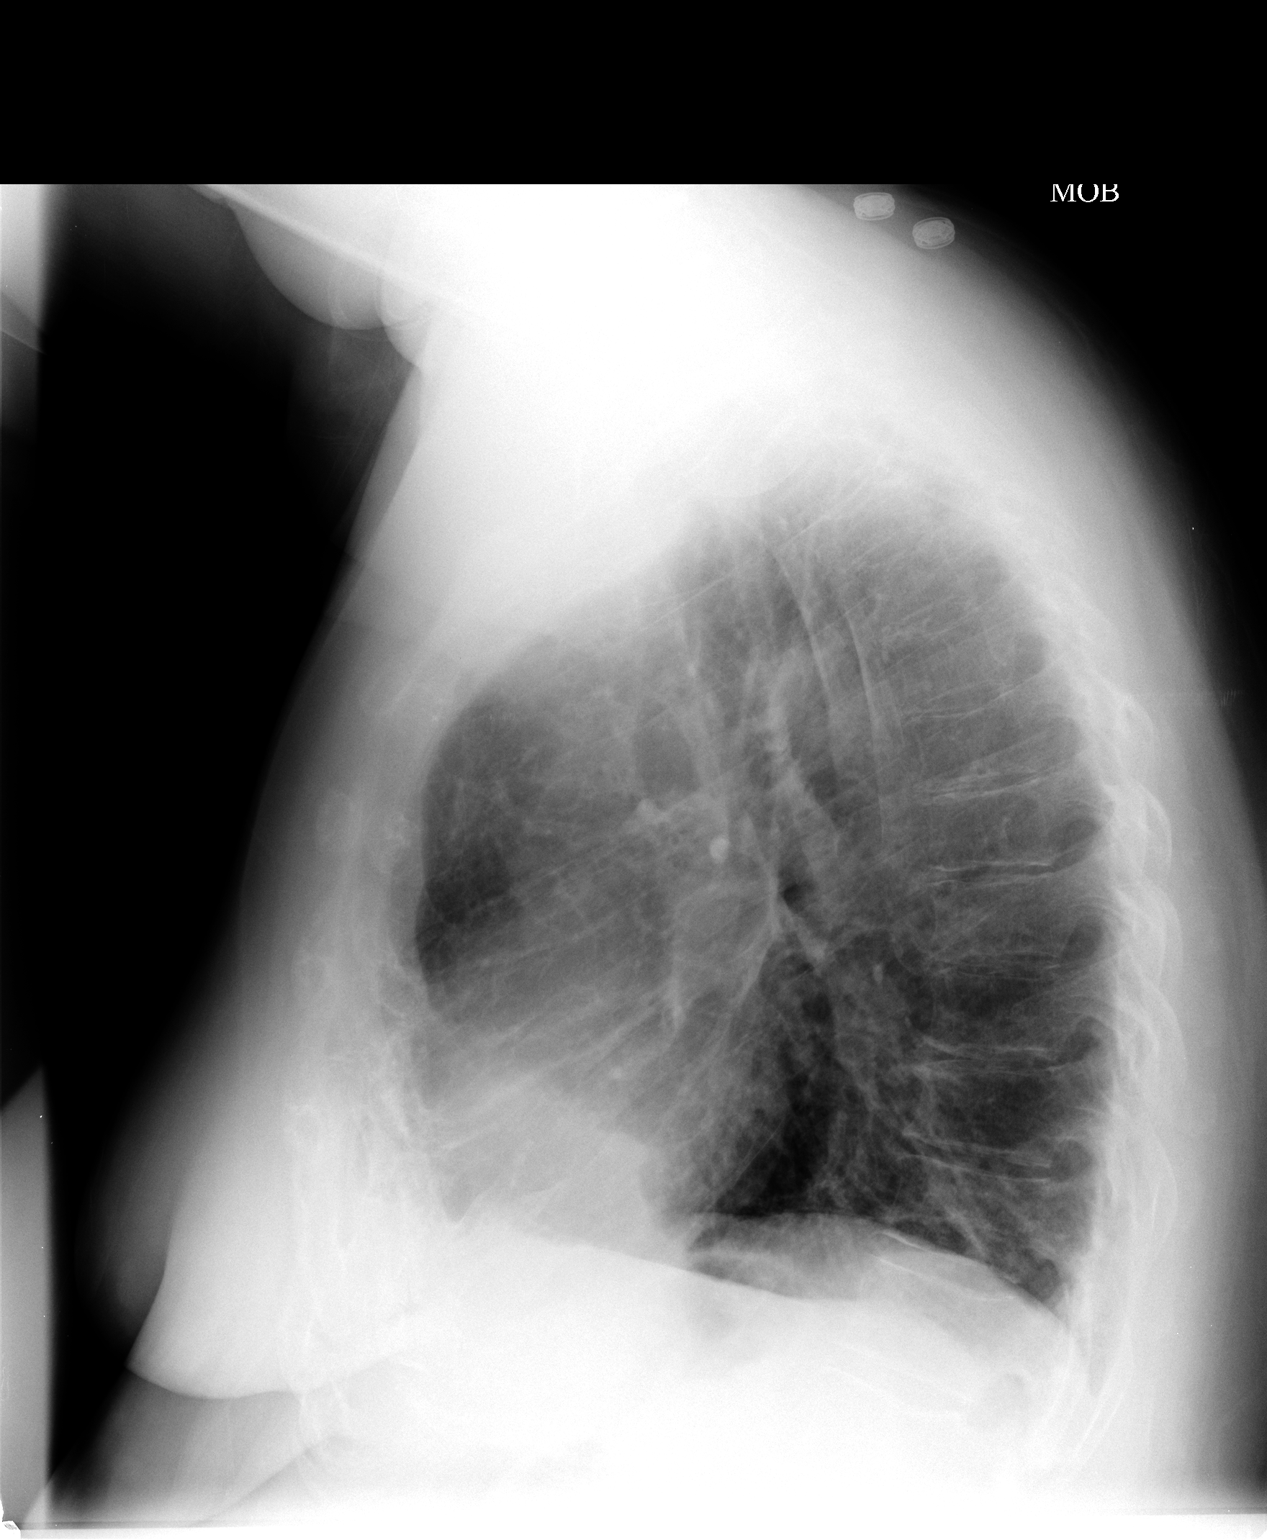

[2 of 2 positions shown; findings below may reference images not displayed]

FINDINGS: Mild streaky opacity is seen in the left lower lobe which
has increased since previous study, and suspicious for pneumonia.
Right lung is clear.  No evidence of pleural effusion.  Heart size
is normal.
IMPRESSION: Increase streaky opacity in the left lower lobe, suspicious for
pneumonia.

## 2014-02-11 ENCOUNTER — Other Ambulatory Visit: Payer: Self-pay | Admitting: Critical Care Medicine

## 2014-02-11 ENCOUNTER — Encounter (HOSPITAL_BASED_OUTPATIENT_CLINIC_OR_DEPARTMENT_OTHER): Payer: Medicare Other

## 2014-02-11 ENCOUNTER — Encounter (HOSPITAL_COMMUNITY): Payer: Self-pay | Admitting: Hematology & Oncology

## 2014-02-11 ENCOUNTER — Encounter (HOSPITAL_COMMUNITY): Payer: Medicare Other

## 2014-02-11 ENCOUNTER — Encounter (HOSPITAL_COMMUNITY): Payer: Medicare Other | Attending: Hematology and Oncology | Admitting: Hematology & Oncology

## 2014-02-11 VITALS — BP 134/71 | HR 64 | Temp 98.3°F | Resp 18 | Wt 171.9 lb

## 2014-02-11 DIAGNOSIS — R9389 Abnormal findings on diagnostic imaging of other specified body structures: Secondary | ICD-10-CM | POA: Insufficient documentation

## 2014-02-11 DIAGNOSIS — R938 Abnormal findings on diagnostic imaging of other specified body structures: Secondary | ICD-10-CM

## 2014-02-11 DIAGNOSIS — D801 Nonfamilial hypogammaglobulinemia: Secondary | ICD-10-CM

## 2014-02-11 DIAGNOSIS — C8590 Non-Hodgkin lymphoma, unspecified, unspecified site: Secondary | ICD-10-CM | POA: Diagnosis present

## 2014-02-11 DIAGNOSIS — D800 Hereditary hypogammaglobulinemia: Secondary | ICD-10-CM | POA: Insufficient documentation

## 2014-02-11 DIAGNOSIS — D649 Anemia, unspecified: Secondary | ICD-10-CM

## 2014-02-11 DIAGNOSIS — D869 Sarcoidosis, unspecified: Secondary | ICD-10-CM | POA: Insufficient documentation

## 2014-02-11 DIAGNOSIS — C8330 Diffuse large B-cell lymphoma, unspecified site: Secondary | ICD-10-CM

## 2014-02-11 LAB — COMPREHENSIVE METABOLIC PANEL
ALT: 14 U/L (ref 0–35)
AST: 18 U/L (ref 0–37)
Albumin: 3.6 g/dL (ref 3.5–5.2)
Alkaline Phosphatase: 95 U/L (ref 39–117)
Anion gap: 3 — ABNORMAL LOW (ref 5–15)
BUN: 13 mg/dL (ref 6–23)
CO2: 31 mmol/L (ref 19–32)
Calcium: 8.7 mg/dL (ref 8.4–10.5)
Chloride: 106 mmol/L (ref 96–112)
Creatinine, Ser: 1.09 mg/dL (ref 0.50–1.10)
GFR calc Af Amer: 64 mL/min — ABNORMAL LOW (ref >90)
GFR, EST NON AFRICAN AMERICAN: 55 mL/min — AB (ref >90)
Glucose, Bld: 107 mg/dL — ABNORMAL HIGH (ref 70–99)
POTASSIUM: 3.7 mmol/L (ref 3.5–5.1)
Sodium: 140 mmol/L (ref 135–145)
Total Bilirubin: 0.5 mg/dL (ref 0.3–1.2)
Total Protein: 6.7 g/dL (ref 6.0–8.3)

## 2014-02-11 LAB — LACTATE DEHYDROGENASE: LDH: 153 U/L (ref 94–250)

## 2014-02-11 LAB — CBC WITH DIFFERENTIAL/PLATELET
BASOS PCT: 0 % (ref 0–1)
Basophils Absolute: 0 10*3/uL (ref 0.0–0.1)
EOS PCT: 2 % (ref 0–5)
Eosinophils Absolute: 0.2 10*3/uL (ref 0.0–0.7)
HEMATOCRIT: 36.5 % (ref 36.0–46.0)
Hemoglobin: 11.3 g/dL — ABNORMAL LOW (ref 12.0–15.0)
Lymphocytes Relative: 13 % (ref 12–46)
Lymphs Abs: 1.1 10*3/uL (ref 0.7–4.0)
MCH: 27.4 pg (ref 26.0–34.0)
MCHC: 31 g/dL (ref 30.0–36.0)
MCV: 88.6 fL (ref 78.0–100.0)
MONO ABS: 0.5 10*3/uL (ref 0.1–1.0)
Monocytes Relative: 7 % (ref 3–12)
Neutro Abs: 6.4 10*3/uL (ref 1.7–7.7)
Neutrophils Relative %: 78 % — ABNORMAL HIGH (ref 43–77)
Platelets: 169 10*3/uL (ref 150–400)
RBC: 4.12 MIL/uL (ref 3.87–5.11)
RDW: 14.8 % (ref 11.5–15.5)
WBC: 8.1 10*3/uL (ref 4.0–10.5)

## 2014-02-11 LAB — IRON AND TIBC
IRON: 38 ug/dL — AB (ref 42–145)
SATURATION RATIOS: 14 % — AB (ref 20–55)
TIBC: 268 ug/dL (ref 250–470)
UIBC: 230 ug/dL (ref 125–400)

## 2014-02-11 LAB — VITAMIN B12: Vitamin B-12: 420 pg/mL (ref 211–911)

## 2014-02-11 LAB — FERRITIN: Ferritin: 22 ng/mL (ref 10–291)

## 2014-02-11 LAB — FOLATE: FOLATE: 12.2 ng/mL

## 2014-02-11 MED ORDER — IMMUNE GLOBULIN (HUMAN) 10 GM/100ML IV SOLN
50.0000 g | Freq: Once | INTRAVENOUS | Status: AC
  Administered 2014-02-11: 50 g via INTRAVENOUS
  Filled 2014-02-11: qty 100

## 2014-02-11 MED ORDER — SODIUM CHLORIDE 0.9 % IV SOLN
Freq: Once | INTRAVENOUS | Status: AC
  Administered 2014-02-11: 09:00:00 via INTRAVENOUS

## 2014-02-11 MED ORDER — SODIUM CHLORIDE 0.9 % IJ SOLN: 10.0000 mL | INTRAMUSCULAR | Status: DC | PRN

## 2014-02-11 MED ORDER — ACYCLOVIR 400 MG PO TABS: 400.0000 mg | ORAL_TABLET | Freq: Two times a day (BID) | ORAL | Status: DC

## 2014-02-11 MED ORDER — DIPHENHYDRAMINE HCL 25 MG PO CAPS: 25.0000 mg | ORAL_CAPSULE | Freq: Once | ORAL | Status: DC

## 2014-02-11 MED ORDER — GABAPENTIN 300 MG PO CAPS: 300.0000 mg | ORAL_CAPSULE | Freq: Four times a day (QID) | ORAL | Status: DC

## 2014-02-11 MED ORDER — LEVOFLOXACIN 500 MG PO TABS: ORAL_TABLET | ORAL | Status: DC

## 2014-02-11 MED ORDER — ACETAMINOPHEN 325 MG PO TABS: 650.0000 mg | ORAL_TABLET | Freq: Four times a day (QID) | ORAL | Status: DC | PRN

## 2014-02-11 NOTE — Assessment & Plan Note (Signed)
Pleasant 59 year old female with hypogammaglobulinemia and recurrent sinopulmonary infection. She is currently on IVIG. Last IgG level was around 700. He had been treating her for pneumonia. Symptoms are improved but exam remains abnormal. I have recommended continuing her antibiotic for an additional 7 days. We will add quantitative immunoglobulins to her labs today. I anticipate needing to increase her IVIG dose. I have discussed this with her in detail and she is agreeable.

## 2014-02-11 NOTE — Assessment & Plan Note (Signed)
Pleasant 59 year old female with a history of diffuse large B-cell lymphoma, and hypogammaglobulinemia. She has had low-grade fevers for several weeks, with a temperature spike to 100.9 several evenings ago. She is also having some right rib pain that is new and concerning. CT imaging of the chest was performed and shows the development of mild reticular nodular infiltrate medially in the right lower lobe. I discussed with her that this may be an infectious process especially given her recent fevers and discomfort, I have recommended a 14 day course of Levaquin. I have advised her if her symptoms do not improve or worsen over the next week or 2 to please call me for sooner follow-up. But I will tentatively schedule her for follow-up in 4 weeks prior to her next dose of IVIG.

## 2014-02-11 NOTE — Patient Instructions (Signed)
[facility] Discharge Instructions  RECOMMENDATIONS MADE BY THE CONSULTANT AND ANY TEST RESULTS WILL BE SENT TO YOUR REFERRING PHYSICIAN.  SPECIAL INSTRUCTIONS/FOLLOW-UP:  Please call prior to your next visit with any problems or concerns. We will see you back again in 4 weeks  with labs and an office visit.   Thank you for choosing [facility] to provide your oncology and hematology care.  To afford each patient quality time with our providers, please arrive at least 15 minutes before your scheduled appointment time.  With your help, our goal is to use those 15 minutes to complete the necessary work-up to ensure our physicians have the information they need to help with your evaluation and healthcare recommendations.    Effective January 1st, 2014, we ask that you re-schedule your appointment with our physicians should you arrive 10 or more minutes late for your appointment.  We strive to give you quality time with our providers, and arriving late affects you and other patients whose appointments are after yours.    Again, thank you for choosing [facility].  Our hope is that these requests will decrease the amount of time that you wait before being seen by our physicians.       _____________________________________________________________  Should you have questions after your visit to [facility], please contact our office at [phone] between the hours of 8:30 a.m. and 5:00 p.m.  Voicemails left after 4:30 p.m. will not be returned until the following business day.  For prescription refill requests, have your pharmacy contact our office with your prescription refill request.

## 2014-02-11 NOTE — Assessment & Plan Note (Signed)
D38-year-old female with a history of hypogammaglobulinemia who is currently on IVIG. I have recommended ongoing IVIG therapy and we will plan on seeing her back next month prior to her next dose.

## 2014-02-11 NOTE — Progress Notes (Signed)
Labs drawn by rn iv start

## 2014-02-11 NOTE — Assessment & Plan Note (Signed)
59 year old female with an abnormal CT of the chest, symptoms of fever, and cough. She has been on Levaquin, with resolution of her fever but persistent cough and sputum production. I have extended her Levaquin out an additional week. We are planning on repeating her CT scan exam again in early April. If at her return appointment her pulmonary exam remains abnormal or her symptoms persist we will consider referral to pulmonary. I have also advised her to call should she have any worsening of her underlying symptoms.

## 2014-02-11 NOTE — Assessment & Plan Note (Signed)
58 year old female with a history of diffuse large B-cell lymphoma diagnosed back in 2013. She currently remains without obvious recurrence. We will continue with surveillance of her lymphoma in accordance with the NCCN guidelines.

## 2014-02-11 NOTE — Progress Notes (Signed)
[name], MD [place] [id number]    Hypogammaglobulenemia/IgG deficiency  Maintenance Rituxan 09/30/2011 Hx of Diffuse large B-Cell Lymphoma, S/P R-CHOP x 4 cycles with intrathecal chemotherapy during cycles 3 and 4 with methotrexate and Solu-Cortef. Treatment was stopped due to Pseudomonas sepsis after cycles 2 and 4 which was very severe  CURRENT THERAPY:50 g IV IgG monthly for IgG deficiency.  INTERVAL HISTORY: [name] 59 y.o. female returns for IgG deficiency.  She has had low grade fevers for the past two weeks. One evening her temperature was up to 100.9. She has had some pain on the right rib region and it concerns her because it is how her cancer was when it was first found.  Her cough is worse, she is here to review her CT scans.  MEDICAL HISTORY: Past Medical History  Diagnosis Date  . Non Hodgkin's lymphoma   . Allergic rhinitis   . GERD (gastroesophageal reflux disease)   . Asthma   . Chronic sinusitis   . Respiratory failure   . Cavitary lung disease   . PONV (postoperative nausea and vomiting)   . Endotracheally intubated   . Myocardial infarction 2011  . Anxiety   . Chronic kidney disease   . Anemia   . Pneumonia 01/2013  . 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     No history exists.     is allergic to meperidine hcl and montelukast sodium.  Ms. Hodges had no medications administered during this visit.  SURGICAL HISTORY: Past Surgical History  Procedure Laterality Date  . Vesicovaginal fistula  closure w/ tah    . Nasal sinus surgery    . Neck surgery      x 2   . Portacath placement  7/11    Removed 6/12  . Basal cell carcinoma excision  03/2011    scalp  . Port-a-cath removal    . Peripherally inserted central catheter insertion    . Picc removal    . Tracheostomy    . Abdominal hysterectomy    . Lung lobectomy      right  . Breast surgery Right   . Colonoscopy N/A 11/14/2012    Procedure: COLONOSCOPY;  Surgeon: Rogene Houston, MD;  Location: [facility];  Service: Endoscopy;  Laterality: N/A;  830    SOCIAL HISTORY: History   Social History  . Marital Status: Divorced    Spouse Name: N/A    Number of Children: 3  . Years of Education: N/A   Occupational History  . Garden History Main Topics  . Smoking status: Never Smoker   . Smokeless tobacco: Never Used  . Alcohol Use: No  . Drug Use: No  . Sexual Activity: No   Other Topics Concern  . Not on file   Social History Narrative    FAMILY HISTORY: Family History  Problem Relation Age of Onset  . Emphysema Mother   . Allergies Father   . Asthma Father     as a child  . Leukemia Maternal Grandmother   . Diabetes Brother   . Stroke Mother  Review of Systems  Constitutional: Negative for fever, chills, weight loss and malaise/fatigue.  HENT: Negative for congestion, hearing loss, nosebleeds, sore throat and tinnitus.   Eyes: Negative for blurred vision, double vision, pain and discharge.  Respiratory: Positive for cough and sputum production. Negative for hemoptysis, shortness of breath and wheezing.   Cardiovascular: Negative for chest pain, palpitations, claudication, leg swelling and PND.  Gastrointestinal: Negative for heartburn, nausea, vomiting, abdominal pain, diarrhea, constipation, blood in stool and melena.  Genitourinary: Negative for dysuria, urgency, frequency and hematuria.  Musculoskeletal: Negative for myalgias, joint pain and falls.  Skin:  Negative for itching and rash.  Neurological: Negative for dizziness, tingling, tremors, sensory change, speech change, focal weakness, seizures, loss of consciousness, weakness and headaches.  Endo/Heme/Allergies: Does not bruise/bleed easily.  Psychiatric/Behavioral: Negative for depression, suicidal ideas, memory loss and substance abuse. The patient is not nervous/anxious and does not have insomnia.     PHYSICAL EXAMINATION  ECOG PERFORMANCE STATUS: 0 - Asymptomatic  Filed Vitals:   02/11/14 0835  BP: 134/71  Pulse: 64  Temp: 98.3 F (36.8 C)  Resp: 18    Physical Exam  Constitutional: She is oriented to person, place, and time and well-developed, well-nourished, and in no distress.  HENT:  Head: Normocephalic and atraumatic.  Nose: Nose normal.  Mouth/Throat: Oropharynx is clear and moist. No oropharyngeal exudate.  Eyes: Conjunctivae and EOM are normal. Pupils are equal, round, and reactive to light. Right eye exhibits no discharge. Left eye exhibits no discharge. No scleral icterus.  Neck: Normal range of motion. Neck supple. No tracheal deviation present. No thyromegaly present.  Cardiovascular: Normal rate, regular rhythm and normal heart sounds.  Exam reveals no gallop and no friction rub.   No murmur heard. Pulmonary/Chest: Effort normal. She has no wheezes. She has no rales.  Occasional high pitched wheeze RML,RLL, expiratory  Abdominal: Soft. Bowel sounds are normal. She exhibits no distension and no mass. There is no tenderness. There is no rebound and no guarding.  Musculoskeletal: Normal range of motion. She exhibits no edema.  Lymphadenopathy:    She has no cervical adenopathy.  Neurological: She is alert and oriented to person, place, and time. She has normal reflexes. No cranial nerve deficit. Gait normal. Coordination normal.  Skin: Skin is warm and dry. No rash noted.  Psychiatric: Mood, memory, affect and judgment normal.  Nursing note and vitals  reviewed.   LABORATORY DATA:  CBC    Component Value Date/Time   WBC 8.1 02/11/2014 0817   RBC 4.12 02/11/2014 0817   RBC 3.71* 01/10/2014 0903   HGB 11.3* 02/11/2014 0817   HCT 36.5 02/11/2014 0817   PLT 169 02/11/2014 0817   MCV 88.6 02/11/2014 0817   MCH 27.4 02/11/2014 0817   MCHC 31.0 02/11/2014 0817   RDW 14.8 02/11/2014 0817   LYMPHSABS 1.1 02/11/2014 0817   MONOABS 0.5 02/11/2014 0817   EOSABS 0.2 02/11/2014 0817   BASOSABS 0.0 02/11/2014 0817   CMP     Component Value Date/Time   NA 140 02/11/2014 0817   K 3.7 02/11/2014 0817   CL 106 02/11/2014 0817   CO2 31 02/11/2014 0817   GLUCOSE 107* 02/11/2014 0817   BUN 13 02/11/2014 0817   CREATININE 1.09 02/11/2014 0817   CALCIUM 8.7 02/11/2014 0817   PROT 6.7 02/11/2014 0817   ALBUMIN 3.6 02/11/2014 0817   AST 18 02/11/2014 0817   ALT 14 02/11/2014 0817   ALKPHOS 95 02/11/2014 0817   BILITOT  0.5 02/11/2014 0817   GFRNONAA 55* 02/11/2014 0817   GFRAA 64* 02/11/2014 0817    FINAL DIAGNOSIS Microscopic Examination and Diagnosis  1. KIDNEY, BIOPSY, LEFT RENAL MASS BIOPSY - 18G : - HIGH GRADE LYMPHOMA. - see comment.  DATE REPORTED: 07/10/2009  Electronically Signed Out by Lyndon Code M.D., Vonna Kotyk, Pathologist, Electronic Signature   CLINICAL HISTORY  SPECIMEN(S) OBTAINED 1. Kidney, biopsy, Left Renal Mass Biopsy - 18g  SPECIMEN COMMENTS: 1. Left renal mass (gat)  Gross Description 1. Received in saline are two cores of tan soft tissue, each 1.5 x 0.1 cm which are submitted in toto in one block. (Ssw:Kv 07-08-09)  MICROSCOPIC DESCRIPTION 1. Immunohistochemical stains were performed with the following results: Cd45 Positive Vimentin Positive Synaptophysin Negative Ema Negative Cytokeratin 7 negative Drs. John patrick and Mali rund have reviewed the case and concur with this interpretation. Overall, the findings are consistent with high grade lymphoma. Although the tissue is limited,  additional immunohistochemical stains will be attempted to help specifiy phenotype.  CASE COMMENTS STAINS USED IN DIAGNOSIS: Cd45 Ck-7 Cut 4 blanks for ihc Ema Synaptophysin Vimentin Cut 4 blanks for ihc Cut 4 blanks for ihc Cut 4 blanks for ihc Universal negative control-rabbit Crg Cd 20 Cd 3 Cd 79a Cd 20 Cd 79a Universal negative control-mouse  Addendum: Additional immunohistochemical stains were performed with the following results: Cd 20 - diffusely positive Cd79a - diffusely positive Cd3 - highlights t cells A chromogranin stain is negative. Although the tissue is limited, the findings support high grade non-hodgkin's b cell lymphoma. (Jbk:Kv 07-13-09) Lyndon Code M.D., Vonna Kotyk, Pathologist, Electronic Signature ( signed 07 11 2011) Immunohistochemical stains show that the lymphoma cells are strongly cd20 positive, supporting a b cell phenotype. There are insufficient cells PRESENT on the slide stained with cd79a. (Jbk:Kv -20-11) Kish M.D., Vonna Kotyk, Pathologist, Electronic Signature ( signed 07 21 2011)  CLINICAL DATA: Subsequent evaluation of left lung nodule lower lobe, personal history of non-Hodgkin's lymphoma  RADIOLOGY:  EXAM: CT CHEST WITHOUT CONTRAST  TECHNIQUE: Multidetector CT imaging of the chest was performed following the standard protocol without IV contrast.  COMPARISON: 06/24/2013 PET scan  FINDINGS: Stable mild bilateral lower lobe bronchitic change. In the medial right lower lobe there is mild reticular nodular infiltrate, new from prior study. Mild linear densities lateral right upper lobe image number 26, likely representing scarring as it is not significantly changed.  Subsegmental atelectasis inferior lateral lingula similar to prior study. The previously described rounded opacity in the left lower lobe that measured 8 x 8 mm is not seen current study. Mild right lung base scarring and atelectasis is  very similar to the prior study.  Thoracic inlet and thyroid are normal. Small mediastinal lymph nodes, none of which are pathologic by size, not significantly different from prior study. No pleural or pericardial effusion. No acute musculoskeletal findings.  IMPRESSION: 1. Interval resolution of 8 mm pulmonary nodule left lower lobe of 2. Interval development of mild reticular nodular infiltrate medially in the right lower lobe seen on images 37 through 42. This could represent infectious or inflammatory etiology, although malignancy including lymphangitic tumor spread is not excluded.   Electronically Signed  By: Skipper Cliche M.D.  On: 01/16/2014 10:53 ASSESSMENT and THERAPY PLAN:    Hypogammaglobulinemia, acquired Pleasant 59 year old female with hypogammaglobulinemia and recurrent sinopulmonary infection. She is currently on IVIG. Last IgG level was around 700. He had been treating her for pneumonia. Symptoms are improved but exam remains abnormal. I have recommended continuing her antibiotic  for an additional 7 days. We will add quantitative immunoglobulins to her labs today. I anticipate needing to increase her IVIG dose. I have discussed this with her in detail and she is agreeable.   Abnormal finding on imaging 59 year old female with an abnormal CT of the chest, symptoms of fever, and cough. She has been on Levaquin, with resolution of her fever but persistent cough and sputum production. I have extended her Levaquin out an additional week. We are planning on repeating her CT scan exam again in early April. If at her return appointment her pulmonary exam remains abnormal or her symptoms persist we will consider referral to pulmonary. I have also advised her to call should she have any worsening of her underlying symptoms.   Non Hodgkin's lymphoma 59 year old female with a history of diffuse large B-cell lymphoma diagnosed back in 2013. She currently remains without  obvious recurrence. We will continue with surveillance of her lymphoma in accordance with the NCCN guidelines.   Goes to [place] for INR, advised now on levaquin  All questions were answered. The patient knows to call the clinic with any problems, questions or concerns. We can certainly see the patient much sooner if necessary.   Molli Hazard 02/11/2014

## 2014-02-12 ENCOUNTER — Ambulatory Visit (HOSPITAL_COMMUNITY): Payer: Medicare Other | Admitting: Hematology & Oncology

## 2014-02-12 LAB — IGG, IGA, IGM
IgA: 64 mg/dL — ABNORMAL LOW (ref 69–380)
IgG (Immunoglobin G), Serum: 907 mg/dL (ref 690–1700)
IgM, Serum: 109 mg/dL (ref 52–322)

## 2014-02-13 LAB — PROTEIN ELECTROPHORESIS, SERUM
ALPHA-1-GLOBULIN: 6.1 % — AB (ref 2.9–4.9)
ALPHA-2-GLOBULIN: 11.7 % (ref 7.1–11.8)
Albumin ELP: 57.6 % (ref 55.8–66.1)
Beta 2: 5.3 % (ref 3.2–6.5)
Beta Globulin: 6.4 % (ref 4.7–7.2)
Gamma Globulin: 12.9 % (ref 11.1–18.8)
M-Spike, %: NOT DETECTED g/dL
TOTAL PROTEIN ELP: 6.3 g/dL (ref 6.0–8.3)

## 2014-02-19 LAB — IMMUNOFIXATION ELECTROPHORESIS
IgA: 65 mg/dL — ABNORMAL LOW (ref 69–380)
IgG (Immunoglobin G), Serum: 866 mg/dL (ref 690–1700)
IgM, Serum: 102 mg/dL (ref 52–322)
Total Protein ELP: 6.3 g/dL (ref 6.0–8.3)

## 2014-02-25 ENCOUNTER — Other Ambulatory Visit: Payer: Self-pay | Admitting: Critical Care Medicine

## 2014-03-03 IMAGING — CR DG CHEST 2V
2 series · 2 of 2 positions shown · non-contrast
Comparison: 06/17/2012

CLINICAL DATA: Follow up pneumonia, cough, congestion

CHEST - 2 VIEW

[view not recorded (1 of 2)]
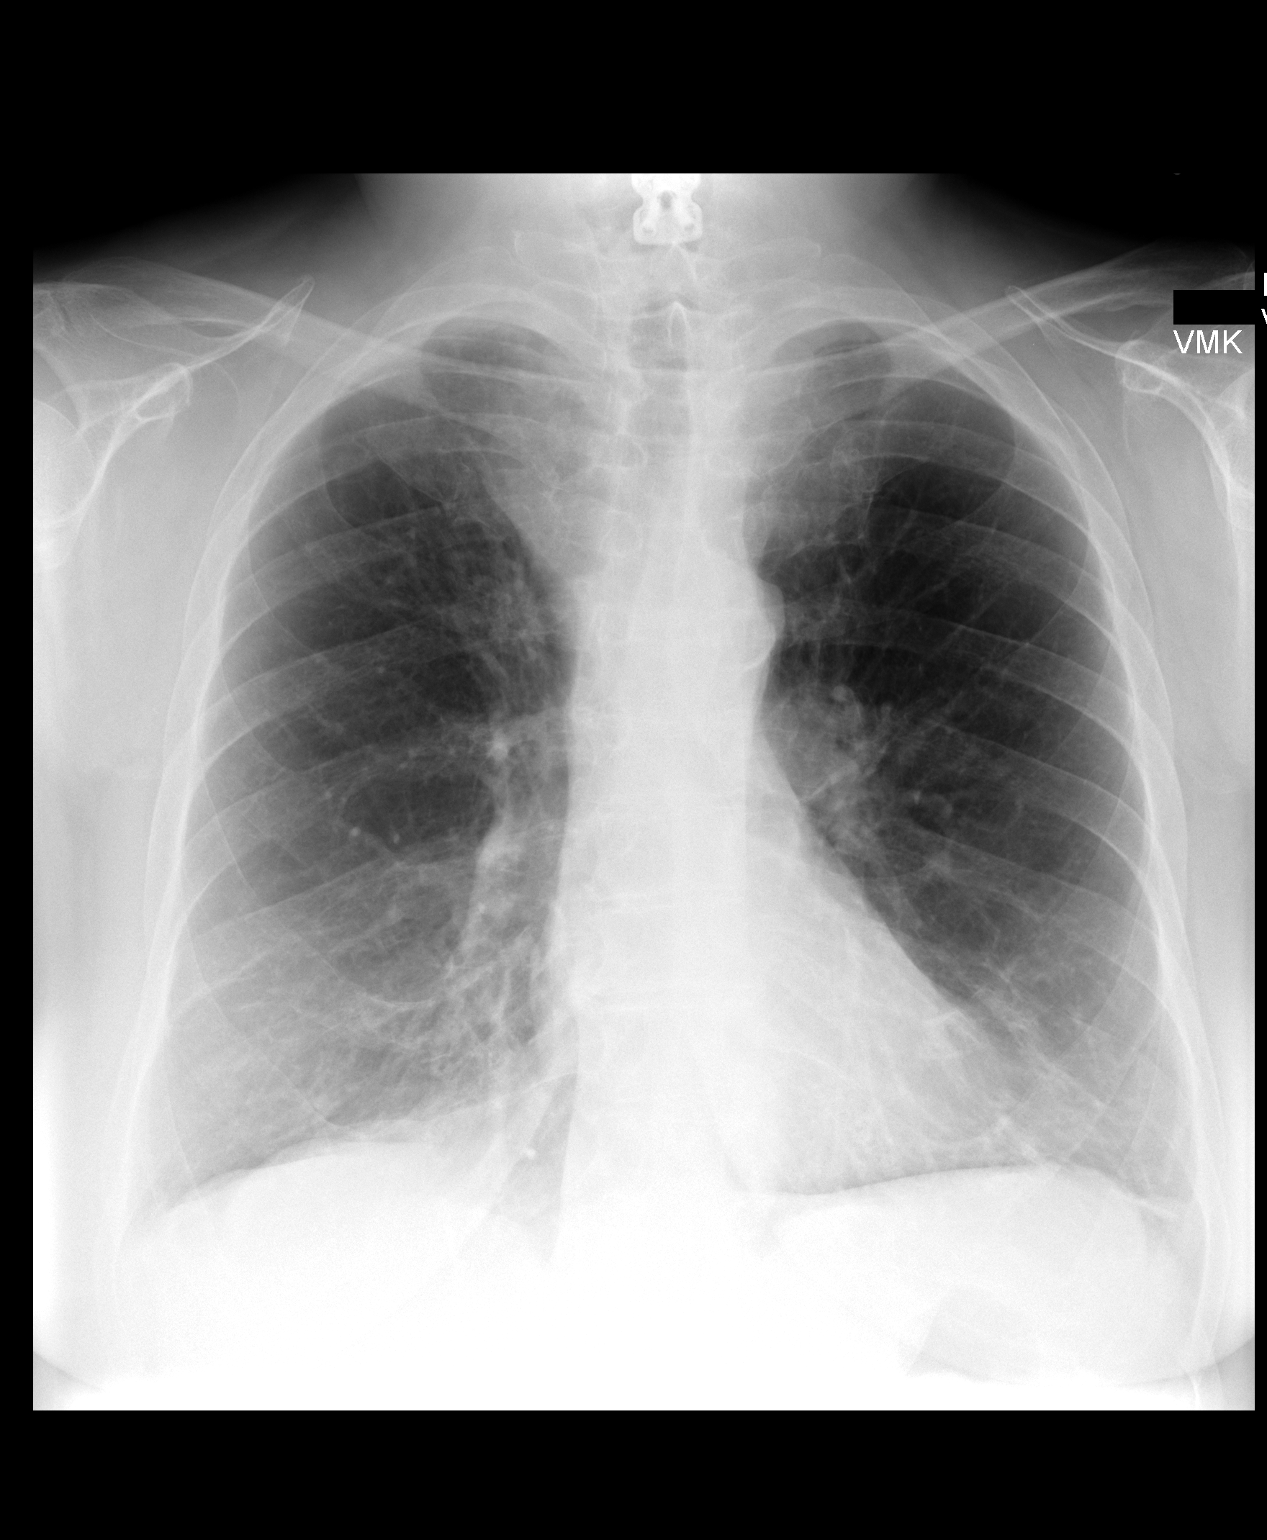

[view not recorded (2 of 2)]
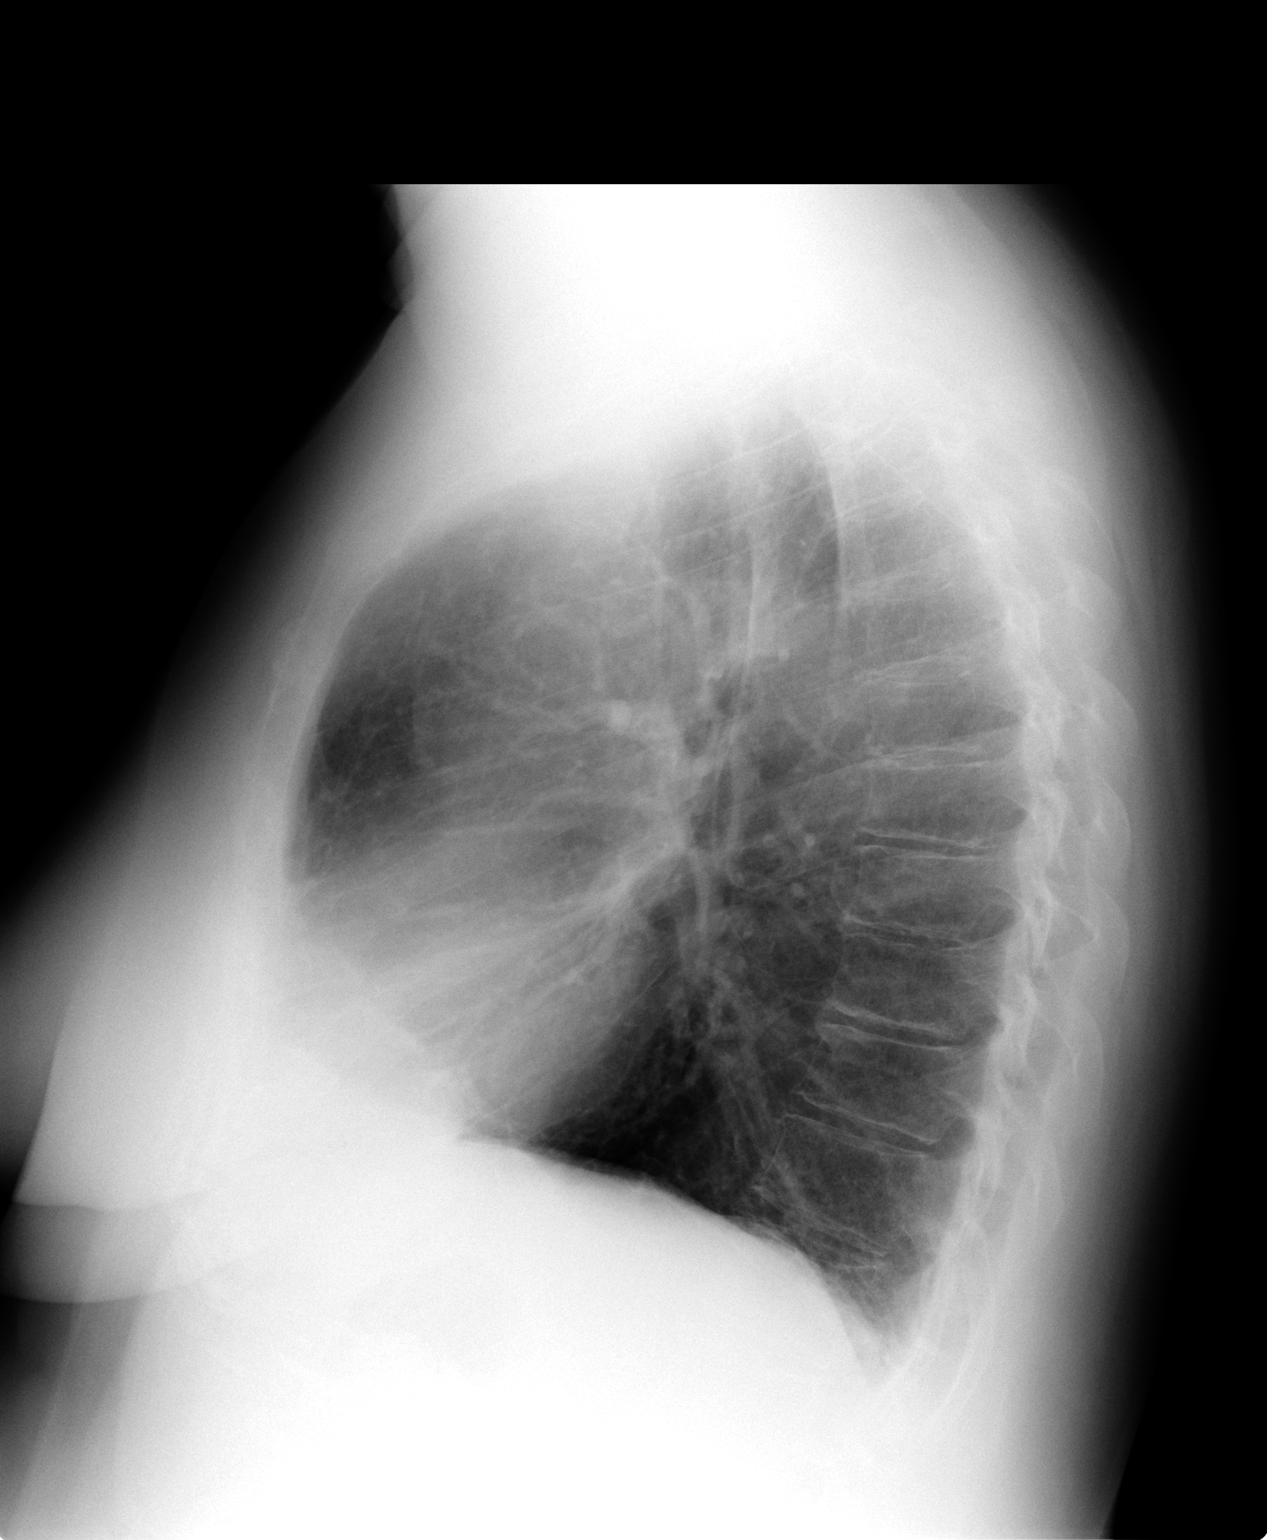

[2 of 2 positions shown; findings below may reference images not displayed]

FINDINGS: Cardiomediastinal silhouette is stable.  No acute
infiltrate or pulmonary edema.  Improvement in aeration in left
lower lobe with minimal residual streaky scarring.  Metallic
fixation plate cervical spine again noted.  Bony thorax is stable.
IMPRESSION: No acute infiltrate or pulmonary edema.  Improvement in aeration in
left lower lobe with minimal residual streaky scarring.

## 2014-03-05 ENCOUNTER — Telehealth (HOSPITAL_COMMUNITY): Payer: Self-pay | Admitting: *Deleted

## 2014-03-05 IMAGING — CT CT PARANASAL SINUSES LIMITED
1 of 2 series · 13 of 30 positions shown, 17 images · non-contrast
Comparison: 02/14/2012.

CLINICAL DATA: Sinus pain and pressure with drainage.  Two prior
sinus surgeries.

CT PARANASAL SINUS LIMITED WITHOUT CONTRAST
TECHNIQUE: Multidetector CT images of the paranasal sinuses were
obtained in a single plane without contrast.

[Series 2: sinus ltd 1.8 u70u · axial · 0.29mm/px · z∈[+62,+162]mm · 13 of 24 slices shown, 17 images]
[im 2/24  brain]
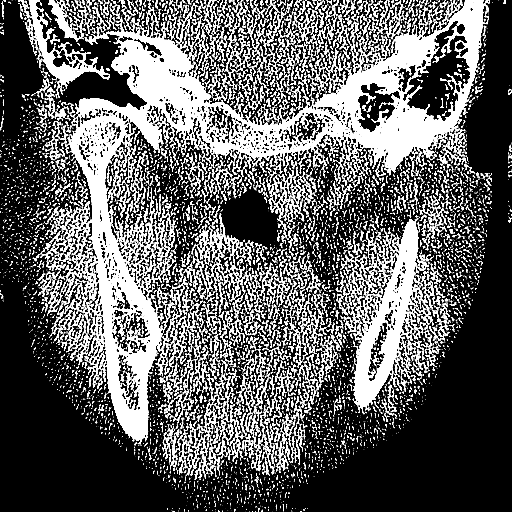
[im 2/24  bone]
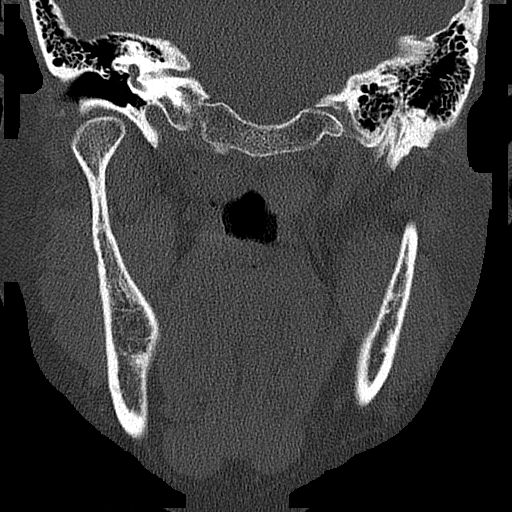
[im 4/24  bone]
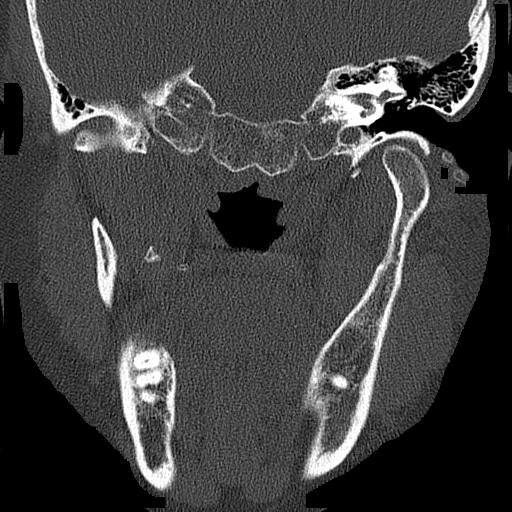
[im 5/24  bone]
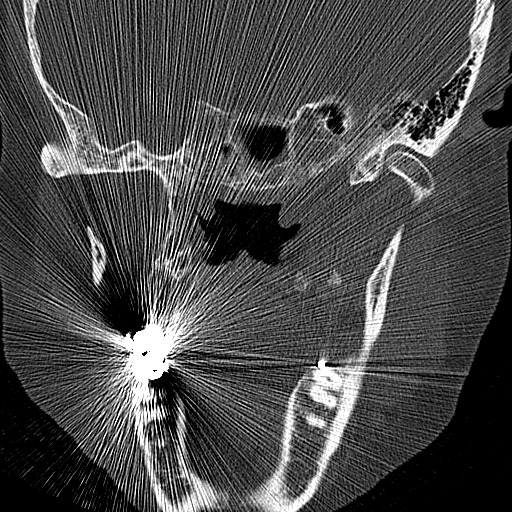
[im 7/24  bone]
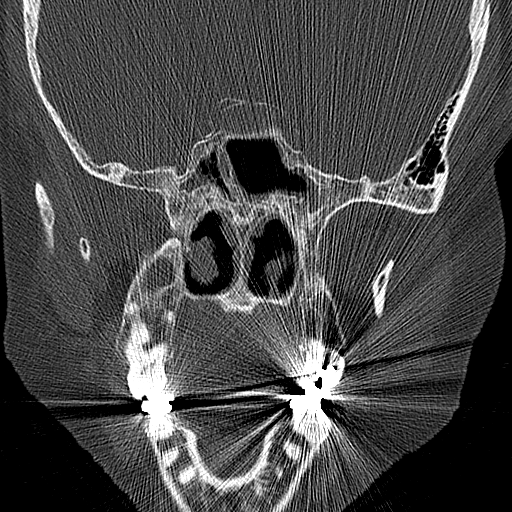
[im 9/24  brain]
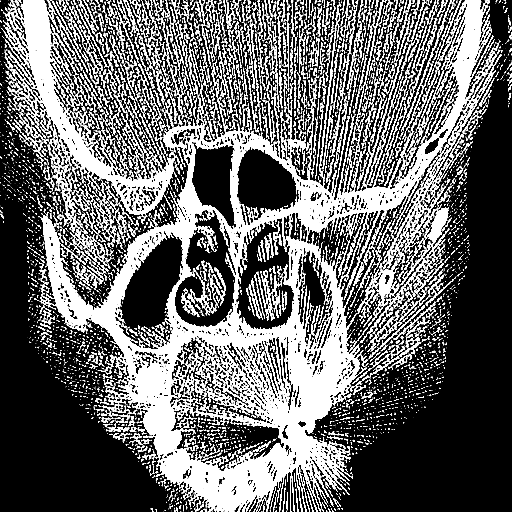
[im 9/24  bone]
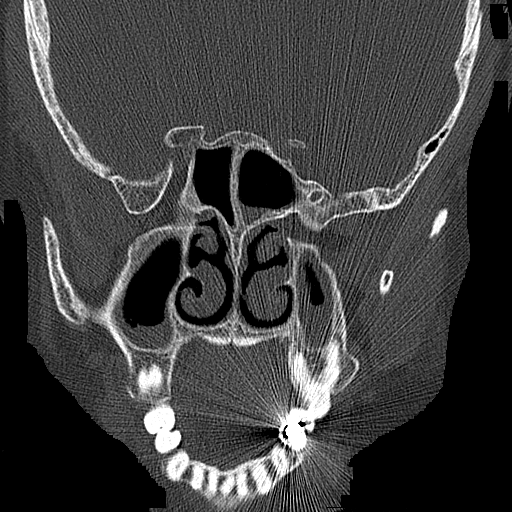
[im 10/24  bone]
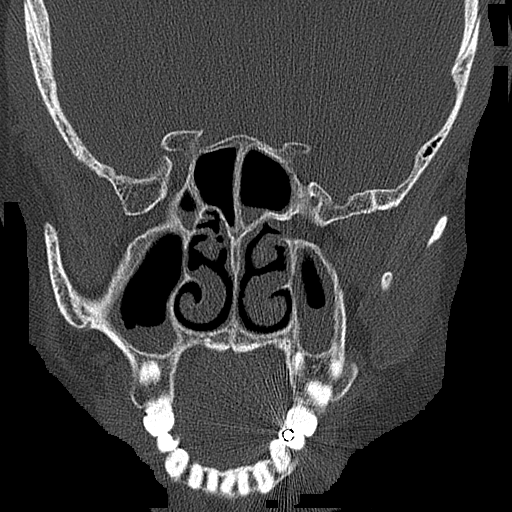
[im 12/24  bone]
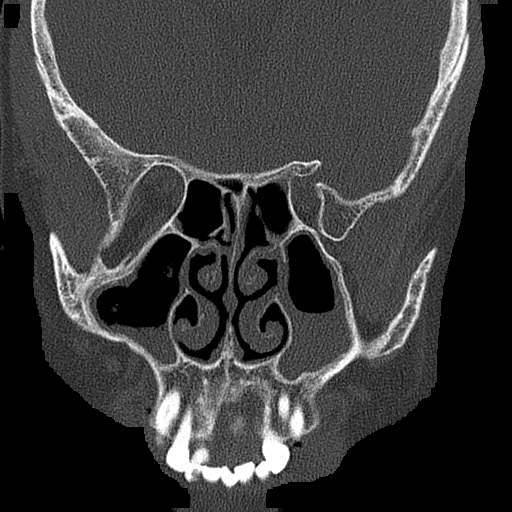
[im 14/24  bone]
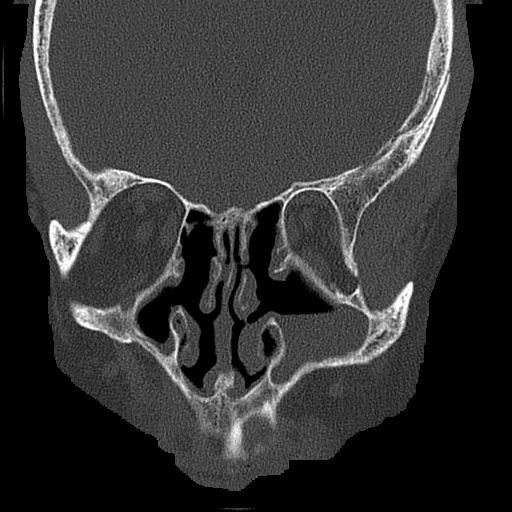
[im 15/24  brain]
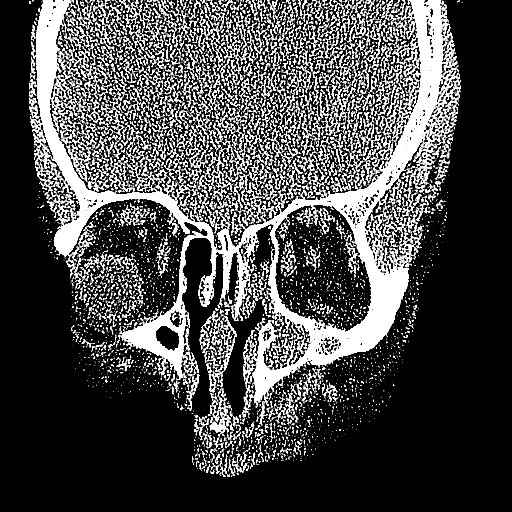
[im 15/24  bone]
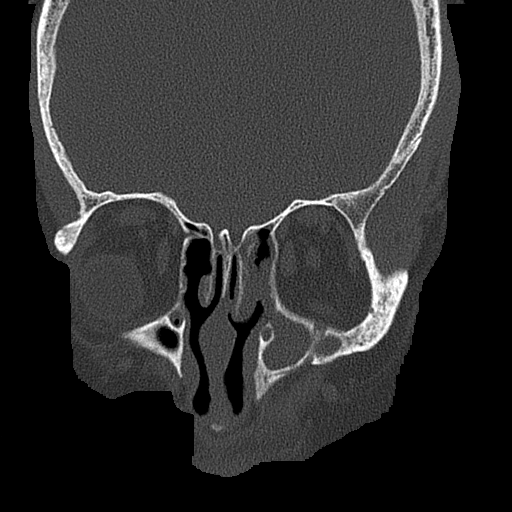
[im 17/24  bone]
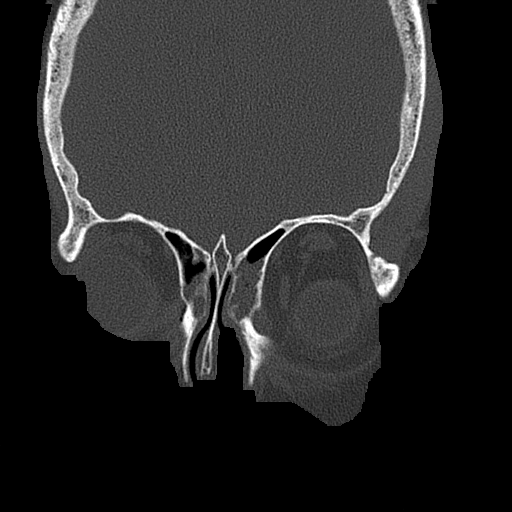
[im 19/24  bone]
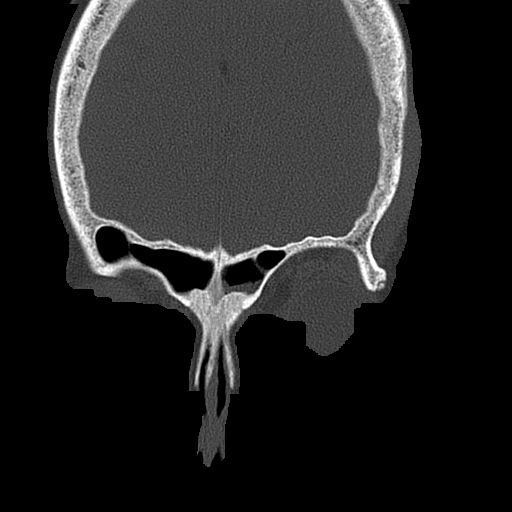
[im 20/24  bone]
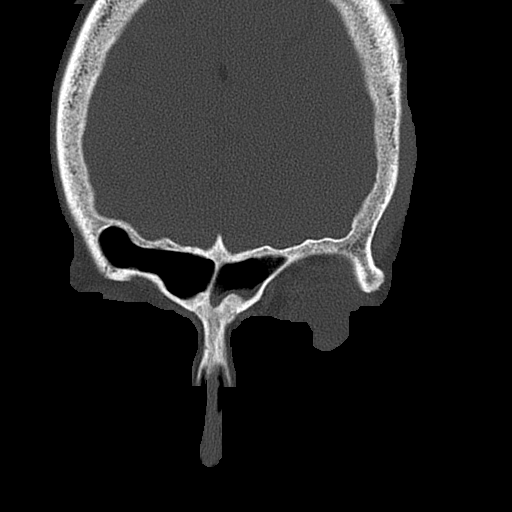
[im 22/24  brain]
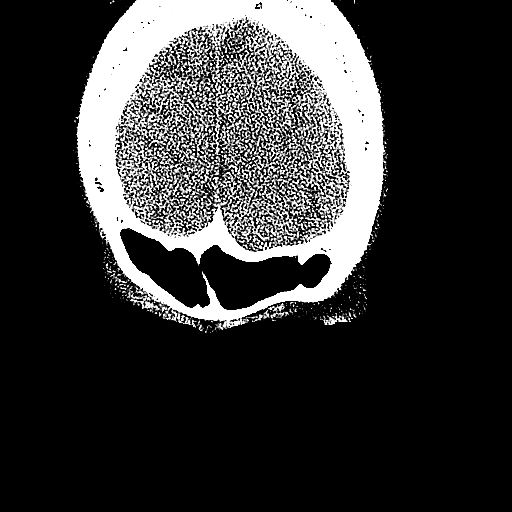
[im 22/24  bone]
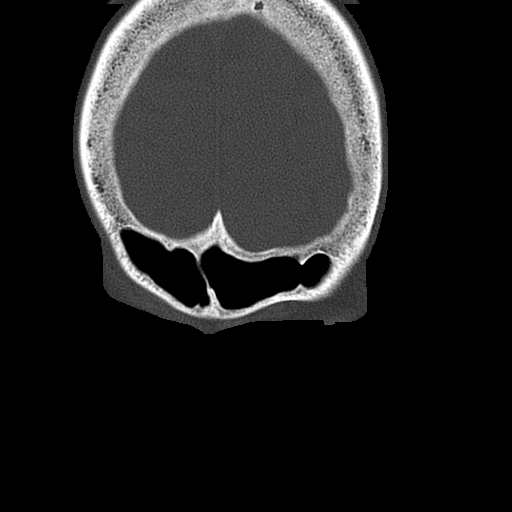

[13 of 30 positions shown; findings below may reference images not displayed]

FINDINGS: Right frontal sinuses clear.  Mild mucosal thickening
inferior aspect left frontal sinus.

Prior maxillary antrectomy and ethmoidectomy bilaterally.  Mucosal
thickening/partial opacification with air fluid level in the
maxillary sinuses greater on the left.  Mucosal thickening along
the ethmoidectomy slight greater on the left.

Bilateral sphenoid sinus air cell mucosal thickening with bubbly
opacification on the right and air fluid level on the left.

Bony margins of the maxillary sinuses and sphenoid sinus air cells
are thickening consistent with changes of chronic sinusitis.

Visualized mastoid air cells and middle ear cavities are clear.

Dental artifact limits evaluation.

Visualized intracranial and orbital structures unremarkable.
IMPRESSION: Acute and chronic paranasal sinus type changes as discussed above.

## 2014-03-05 NOTE — Telephone Encounter (Signed)
I spoke with [name] and she will call Dr. Joya Gaskins and get an appt set up ASAP. I instructed her that if she needs [place] to talk with them to let us know.

## 2014-03-11 ENCOUNTER — Encounter (HOSPITAL_BASED_OUTPATIENT_CLINIC_OR_DEPARTMENT_OTHER): Payer: Medicare Other

## 2014-03-11 ENCOUNTER — Encounter (HOSPITAL_COMMUNITY): Payer: Medicare Other | Attending: Hematology and Oncology | Admitting: Hematology & Oncology

## 2014-03-11 ENCOUNTER — Encounter (HOSPITAL_COMMUNITY): Payer: Self-pay | Admitting: Hematology & Oncology

## 2014-03-11 VITALS — BP 126/68 | HR 77 | Temp 98.6°F | Resp 18 | Wt 173.7 lb

## 2014-03-11 DIAGNOSIS — C8590 Non-Hodgkin lymphoma, unspecified, unspecified site: Secondary | ICD-10-CM | POA: Diagnosis present

## 2014-03-11 DIAGNOSIS — D801 Nonfamilial hypogammaglobulinemia: Secondary | ICD-10-CM | POA: Diagnosis present

## 2014-03-11 DIAGNOSIS — D869 Sarcoidosis, unspecified: Secondary | ICD-10-CM | POA: Diagnosis present

## 2014-03-11 DIAGNOSIS — D800 Hereditary hypogammaglobulinemia: Secondary | ICD-10-CM | POA: Diagnosis present

## 2014-03-11 DIAGNOSIS — C8330 Diffuse large B-cell lymphoma, unspecified site: Secondary | ICD-10-CM

## 2014-03-11 LAB — COMPREHENSIVE METABOLIC PANEL
ALBUMIN: 3.3 g/dL — AB (ref 3.5–5.2)
ALT: 12 U/L (ref 0–35)
AST: 17 U/L (ref 0–37)
Alkaline Phosphatase: 87 U/L (ref 39–117)
Anion gap: 7 (ref 5–15)
BUN: 13 mg/dL (ref 6–23)
CO2: 28 mmol/L (ref 19–32)
Calcium: 8.5 mg/dL (ref 8.4–10.5)
Chloride: 103 mmol/L (ref 96–112)
Creatinine, Ser: 1.28 mg/dL — ABNORMAL HIGH (ref 0.50–1.10)
GFR calc Af Amer: 52 mL/min — ABNORMAL LOW (ref >90)
GFR calc non Af Amer: 45 mL/min — ABNORMAL LOW (ref >90)
GLUCOSE: 182 mg/dL — AB (ref 70–99)
POTASSIUM: 3.9 mmol/L (ref 3.5–5.1)
SODIUM: 138 mmol/L (ref 135–145)
TOTAL PROTEIN: 6.6 g/dL (ref 6.0–8.3)
Total Bilirubin: 0.5 mg/dL (ref 0.3–1.2)

## 2014-03-11 LAB — CBC WITH DIFFERENTIAL/PLATELET
BASOS ABS: 0 10*3/uL (ref 0.0–0.1)
Basophils Relative: 0 % (ref 0–1)
Eosinophils Absolute: 0.2 10*3/uL (ref 0.0–0.7)
Eosinophils Relative: 2 % (ref 0–5)
HEMATOCRIT: 34.5 % — AB (ref 36.0–46.0)
HEMOGLOBIN: 10.6 g/dL — AB (ref 12.0–15.0)
Lymphocytes Relative: 9 % — ABNORMAL LOW (ref 12–46)
Lymphs Abs: 0.7 10*3/uL (ref 0.7–4.0)
MCH: 27 pg (ref 26.0–34.0)
MCHC: 30.7 g/dL (ref 30.0–36.0)
MCV: 88 fL (ref 78.0–100.0)
MONO ABS: 0.5 10*3/uL (ref 0.1–1.0)
MONOS PCT: 6 % (ref 3–12)
Neutro Abs: 7 10*3/uL (ref 1.7–7.7)
Neutrophils Relative %: 83 % — ABNORMAL HIGH (ref 43–77)
Platelets: 168 10*3/uL (ref 150–400)
RBC: 3.92 MIL/uL (ref 3.87–5.11)
RDW: 14.6 % (ref 11.5–15.5)
WBC: 8.5 10*3/uL (ref 4.0–10.5)

## 2014-03-11 LAB — LACTATE DEHYDROGENASE: LDH: 157 U/L (ref 94–250)

## 2014-03-11 MED ORDER — IMMUNE GLOBULIN (HUMAN) 10 GM/100ML IV SOLN
50.0000 g | Freq: Once | INTRAVENOUS | Status: AC
Start: 2014-03-11 — End: 2014-03-11
  Administered 2014-03-11: 50 g via INTRAVENOUS
  Filled 2014-03-11: qty 400

## 2014-03-11 MED ORDER — DIPHENHYDRAMINE HCL 25 MG PO CAPS: 25.0000 mg | ORAL_CAPSULE | Freq: Once | ORAL | Status: DC

## 2014-03-11 MED ORDER — ACETAMINOPHEN 325 MG PO TABS: 650.0000 mg | ORAL_TABLET | Freq: Four times a day (QID) | ORAL | Status: DC | PRN

## 2014-03-11 MED ORDER — SODIUM CHLORIDE 0.9 % IV SOLN
Freq: Once | INTRAVENOUS | Status: AC
  Administered 2014-03-11: 09:00:00 via INTRAVENOUS

## 2014-03-11 MED ORDER — SODIUM CHLORIDE 0.9 % IJ SOLN: 10.0000 mL | INTRAMUSCULAR | Status: DC | PRN

## 2014-03-11 NOTE — Assessment & Plan Note (Addendum)
59 year old female with hypogammaglobulinemia and recurrent sinopulmonary infections. Chest CT in January was performed because of chest pain and worsening cough. She has been on antibiotics and all those symptoms are somewhat better they persist. She has follow-up with pulmonary at the end of March. We will wait for additional pulmonary input prior to making any additional decisions regarding repeat imaging. I recommended ongoing IVIG therapy. Based on levels she does not need an increase in her IVIG dose, but based on symptoms she may and we will address this moving forward.

## 2014-03-11 NOTE — Patient Instructions (Signed)
[place] at [facility]  Discharge Instructions:  You seen Dr Whitney Muse today.  We did lab work today and you will receive IVIG.  Follow up in 1 month with lab work and to get IVIG and to see the doctor.  IF you have any questions or concerns before then please call the clinic. _______________________________________________________________  Thank you for choosing [place] at [facility] to provide your oncology and hematology care.  To afford each patient quality time with our providers, please arrive at least 15 minutes before your scheduled appointment.  You need to re-schedule your appointment if you arrive 10 or more minutes late.  We strive to give you quality time with our providers, and arriving late affects you and other patients whose appointments are after yours.  Also, if you no show three or more times for appointments you may be dismissed from the clinic.  Again, thank you for choosing [place] at [place] hope is that these requests will allow you access to exceptional care and in a timely manner. _______________________________________________________________  If you have questions after your visit, please contact our office at [phone] between the hours of 8:30 a.m. and 5:00 p.m. Voicemails left after 4:30 p.m. will not be returned until the following business day. _______________________________________________________________  For prescription refill requests, have your pharmacy contact our office. _______________________________________________________________  Recommendations made by the consultant and any test results will be sent to your referring physician. _______________________________________________________________

## 2014-03-11 NOTE — Assessment & Plan Note (Signed)
History of diffuse large B-cell lymphoma currently without evidence of recurrence. We will continue forward with observation

## 2014-03-11 NOTE — Progress Notes (Signed)
Patient reports taking tylenol and benadryl at home prior to today's appointment. Tolerated IVIG infusion well.

## 2014-03-11 NOTE — Progress Notes (Signed)
[name], MD [place] [id number]    Hypogammaglobulenemia/IgG deficiency  Maintenance Rituxan 09/30/2011 Hx of Diffuse large B-Cell Lymphoma, S/P R-CHOP x 4 cycles with intrathecal chemotherapy during cycles 3 and 4 with methotrexate and Solu-Cortef. Treatment was stopped due to Pseudomonas sepsis after cycles 2 and 4 which was very severe  CURRENT THERAPY:50 g IV IgG monthly for IgG deficiency.  INTERVAL HISTORY: [name] 59 y.o. female returns for IgG deficiency.  She is due for IVIG today. She continues to have problems with sinus drainage and cough, and sputum production. She has not had any fever for 4-5 days. But still has intermittent low-grade fevers up to 100. Her appetite is okay. She does not sleep well but that is chronic. She has follow-up with pulmonary on the 30th of this month. We did a chest CT at the end of January and will wait for her pulmonary appointment before repeating it again.   MEDICAL HISTORY: Past Medical History  Diagnosis Date  . Non Hodgkin's lymphoma   . Allergic rhinitis   . GERD (gastroesophageal reflux disease)   . Asthma   . Chronic sinusitis   . Respiratory failure   . Cavitary lung disease   . PONV (postoperative nausea and vomiting)   . Endotracheally intubated   . Myocardial infarction 2011  . Anxiety   . Chronic kidney disease   . Anemia   . Pneumonia 01/2013  . 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     No history exists.     is allergic to meperidine hcl and montelukast  sodium.  Ms. Tolles had no medications administered during this visit.  SURGICAL HISTORY: Past Surgical History  Procedure Laterality Date  . Vesicovaginal fistula closure w/ tah    . Nasal sinus surgery    . Neck surgery      x 2   . Portacath placement  7/11    Removed 6/12  . Basal cell carcinoma excision  03/2011    scalp  . Port-a-cath removal    . Peripherally inserted central catheter insertion    . Picc removal    . Tracheostomy    . Abdominal hysterectomy    . Lung lobectomy      right  . Breast surgery Right   . Colonoscopy N/A 11/14/2012    Procedure: COLONOSCOPY;  Surgeon: Rogene Houston, MD;  Location: [facility];  Service: Endoscopy;  Laterality: N/A;  830    SOCIAL HISTORY: History   Social History  . Marital Status: Divorced    Spouse Name: N/A  . Number of Children: 3  . Years of Education: N/A   Occupational History  . [place] History Main Topics  . Smoking status: Never Smoker   . Smokeless tobacco: Never Used  . Alcohol Use: No  . Drug Use: No  . Sexual Activity: No   Other Topics Concern  . Not on file   Social History Narrative    FAMILY HISTORY: Family History  Problem Relation Age of Onset  . Emphysema Mother   . Allergies Father   . Asthma  Father     as a child  . Leukemia Maternal Grandmother   . Diabetes Brother   . Stroke Mother     Review of Systems  Constitutional: Positive for fever. Negative for chills, weight loss and malaise/fatigue.  HENT: Negative for congestion, hearing loss, nosebleeds, sore throat and tinnitus.   Eyes: Negative for blurred vision, double vision, pain and discharge.  Respiratory: Positive for cough and sputum production. Negative for hemoptysis, shortness of breath and wheezing.   Cardiovascular: Negative for chest pain, palpitations, claudication, leg swelling and PND.  Gastrointestinal: Negative for heartburn, nausea, vomiting, abdominal pain, diarrhea,  constipation, blood in stool and melena.  Genitourinary: Negative for dysuria, urgency, frequency and hematuria.  Musculoskeletal: Negative for myalgias, joint pain and falls.  Skin: Negative for itching and rash.  Neurological: Negative for dizziness, tingling, tremors, sensory change, speech change, focal weakness, seizures, loss of consciousness, weakness and headaches.  Endo/Heme/Allergies: Does not bruise/bleed easily.  Psychiatric/Behavioral: Negative for depression, suicidal ideas, memory loss and substance abuse. The patient has insomnia. The patient is not nervous/anxious.     PHYSICAL EXAMINATION  ECOG PERFORMANCE STATUS: 0 - Asymptomatic  Filed Vitals:   03/11/14 0800  BP: 126/68  Pulse: 77  Temp: 98.6 F (37 C)  Resp: 18    Physical Exam  Constitutional: She is oriented to person, place, and time and well-developed, well-nourished, and in no distress.  HENT:  Head: Normocephalic and atraumatic.  Nose: Nose normal.  Mouth/Throat: Oropharynx is clear and moist. No oropharyngeal exudate.  Eyes: Conjunctivae and EOM are normal. Pupils are equal, round, and reactive to light. Right eye exhibits no discharge. Left eye exhibits no discharge. No scleral icterus.  Neck: Normal range of motion. Neck supple. No tracheal deviation present. No thyromegaly present.  Cardiovascular: Normal rate, regular rhythm and normal heart sounds.  Exam reveals no gallop and no friction rub.   No murmur heard. Pulmonary/Chest: Effort normal. She has no wheezes. She has no rales.  Occasional high pitched wheeze RML,RLL, expiratory. Occasional rhonchi R>L  Abdominal: Soft. Bowel sounds are normal. She exhibits no distension and no mass. There is no tenderness. There is no rebound and no guarding.  Musculoskeletal: Normal range of motion. She exhibits no edema.  Lymphadenopathy:    She has no cervical adenopathy.  Neurological: She is alert and oriented to person, place, and time. She has normal  reflexes. No cranial nerve deficit. Gait normal. Coordination normal.  Skin: Skin is warm and dry. No rash noted.  Psychiatric: Mood, memory, affect and judgment normal.  Nursing note and vitals reviewed.   LABORATORY DATA:  CBC    Component Value Date/Time   WBC 8.5 03/11/2014 0836   RBC 3.92 03/11/2014 0836   RBC 3.71* 01/10/2014 0903   HGB 10.6* 03/11/2014 0836   HCT 34.5* 03/11/2014 0836   PLT 168 03/11/2014 0836   MCV 88.0 03/11/2014 0836   MCH 27.0 03/11/2014 0836   MCHC 30.7 03/11/2014 0836   RDW 14.6 03/11/2014 0836   LYMPHSABS 0.7 03/11/2014 0836   MONOABS 0.5 03/11/2014 0836   EOSABS 0.2 03/11/2014 0836   BASOSABS 0.0 03/11/2014 0836   CMP     Component Value Date/Time   NA 140 02/11/2014 0817   K 3.7 02/11/2014 0817   CL 106 02/11/2014 0817   CO2 31 02/11/2014 0817   GLUCOSE 107* 02/11/2014 0817   BUN 13 02/11/2014 0817   CREATININE 1.09 02/11/2014 0817   CALCIUM 8.7 02/11/2014 0817  PROT 6.7 02/11/2014 0817   ALBUMIN 3.6 02/11/2014 0817   AST 18 02/11/2014 0817   ALT 14 02/11/2014 0817   ALKPHOS 95 02/11/2014 0817   BILITOT 0.5 02/11/2014 0817   GFRNONAA 55* 02/11/2014 0817   GFRAA 64* 02/11/2014 0817      RADIOLOGY:  EXAM: CT CHEST WITHOUT CONTRAST  TECHNIQUE: Multidetector CT imaging of the chest was performed following the standard protocol without IV contrast.  COMPARISON: 06/24/2013 PET scan  FINDINGS: Stable mild bilateral lower lobe bronchitic change. In the medial right lower lobe there is mild reticular nodular infiltrate, new from prior study. Mild linear densities lateral right upper lobe image number 26, likely representing scarring as it is not significantly changed.  Subsegmental atelectasis inferior lateral lingula similar to prior study. The previously described rounded opacity in the left lower lobe that measured 8 x 8 mm is not seen current study. Mild right lung base scarring and atelectasis is very similar to  the prior study.  Thoracic inlet and thyroid are normal. Small mediastinal lymph nodes, none of which are pathologic by size, not significantly different from prior study. No pleural or pericardial effusion. No acute musculoskeletal findings.  IMPRESSION: 1. Interval resolution of 8 mm pulmonary nodule left lower lobe of 2. Interval development of mild reticular nodular infiltrate medially in the right lower lobe seen on images 37 through 42. This could represent infectious or inflammatory etiology, although malignancy including lymphangitic tumor spread is not excluded.   Electronically Signed  By: Skipper Cliche M.D.  On: 01/16/2014 10:53   ASSESSMENT and THERAPY PLAN:    Hypogammaglobulinemia, acquired 59 year old female with hypogammaglobulinemia and recurrent sinopulmonary infections. Chest CT in January was performed because of chest pain and worsening cough. She has been on antibiotics and all those symptoms are somewhat better they persist. She has follow-up with pulmonary at the end of March. We will wait for additional pulmonary input prior to making any additional decisions regarding repeat imaging. I recommended ongoing IVIG therapy. Based on levels she does not need an increase in her IVIG dose, but based on symptoms she may and we will address this moving forward.   Non Hodgkin's lymphoma History of diffuse large B-cell lymphoma currently without evidence of recurrence. We will continue forward with observation    All questions were answered. The patient knows to call the clinic with any problems, questions or concerns. We can certainly see the patient much sooner if necessary.   Molli Hazard 03/11/2014

## 2014-03-11 NOTE — Patient Instructions (Signed)
[place] at [facility]  Discharge Instructions:  You seen Dr Whitney Muse today.  We did lab work today and you will receive IVIG.  Follow up in 1 month with lab work and to get IVIG and to see the doctor.  IF you have any questions or concerns before then please call the clinic. _______________________________________________________________  Thank you for choosing [place] at [facility] to provide your oncology and hematology care.  To afford each patient quality time with our providers, please arrive at least 15 minutes before your scheduled appointment.  You need to re-schedule your appointment if you arrive 10 or more minutes late.  We strive to give you quality time with our providers, and arriving late affects you and other patients whose appointments are after yours.  Also, if you no show three or more times for appointments you may be dismissed from the clinic.  Again, thank you for choosing [place] at [place] hope is that these requests will allow you access to exceptional care and in a timely manner. _______________________________________________________________  If you have questions after your visit, please contact our office at [phone] between the hours of 8:30 a.m. and 5:00 p.m. Voicemails left after 4:30 p.m. will not be returned until the following business day. _______________________________________________________________  For prescription refill requests, have your pharmacy contact our office. _______________________________________________________________  Recommendations made by the consultant and any test results will be sent to your referring physician. _______________________________________________________________

## 2014-03-11 NOTE — Progress Notes (Signed)
Labs for cbcd,cmp,ldh  

## 2014-04-02 ENCOUNTER — Encounter: Payer: Self-pay | Admitting: Critical Care Medicine

## 2014-04-02 ENCOUNTER — Ambulatory Visit (INDEPENDENT_AMBULATORY_CARE_PROVIDER_SITE_OTHER): Payer: Medicare Other | Admitting: Critical Care Medicine

## 2014-04-02 VITALS — BP 130/78 | HR 73 | Temp 98.1°F | Ht 61.0 in | Wt 171.4 lb

## 2014-04-02 DIAGNOSIS — J4551 Severe persistent asthma with (acute) exacerbation: Secondary | ICD-10-CM | POA: Diagnosis not present

## 2014-04-02 DIAGNOSIS — J0191 Acute recurrent sinusitis, unspecified: Secondary | ICD-10-CM | POA: Diagnosis not present

## 2014-04-02 MED ORDER — CIPROFLOXACIN HCL 750 MG PO TABS: 750.0000 mg | ORAL_TABLET | Freq: Two times a day (BID) | ORAL | Status: DC

## 2014-04-02 MED ORDER — PREDNISONE 10 MG PO TABS: ORAL_TABLET | ORAL | Status: DC

## 2014-04-02 NOTE — Progress Notes (Signed)
Subjective:    Patient ID: [name], female    DOB: 1955-01-17, 59 y.o.   MRN: [id number]  HPI 04/02/2014 Chief Complaint  Patient presents with  . Follow-up    Pt states she has SOB with a prod cough with yellow thick mucus. Tightness with slight wheezing.   Chronic cough yellow thick mucus, no more than usual.  Notes some pndrip.  Notes sl wheezing.  Pt notes dyspnea with any exertion. No real fever. Pt on IV IgG monthly Pt on ABX x 2 03/2014.  (levaquin x 2 01/2014 and 02/2014)      PUL ASTHMA HISTORY 04/02/2014 10/16/2013 05/23/2013 01/07/2013 07/11/2012 05/25/2012 01/25/2012  Symptoms Daily Daily >2 days/week 0-2 days/week Daily Daily Daily  Nighttime awakenings Often--7/wk 3-4/month 0-2/month 0-2/month 0-2/month >1/wk but not nightly Often--7/wk  Interference with activity Extreme limitations Some limitations Minor limitations Minor limitations Some limitations Some limitations Some limitations  SABA use Several times/day > 2 days/wk--not > 1 x/day 0-2 days/wk 0-2 days/wk > 2 days/wk--not > 1 x/day Daily Several times/day  Exacerbations requiring oral steroids 2 or more / year 0-1 / year 2 or more / year 0-1 / year 2 or more / year 2 or more / year 2 or more / year     Review of Systems Constitutional:   No  weight loss, night sweats,  Fevers, chills, fatigue, lassitude. HEENT:   No headaches,  Difficulty swallowing,  Tooth/dental problems,  Sore throat,                No sneezing, itching, ear ache, nasal congestion, post nasal drip,   CV:  No chest pain,  Orthopnea, PND, swelling in lower extremities, anasarca, dizziness, palpitations  GI  No heartburn, indigestion, abdominal pain, nausea, vomiting, diarrhea, change in bowel habits, loss of appetite  Resp: Notes shortness of breath with exertion not  at rest.  Notes  excess mucus, no productive cough,  No non-productive cough,  No coughing up of blood.  No change in color of mucus.  No wheezing.  No chest wall deformity  Skin:  no rash or lesions.  GU: no dysuria, change in color of urine, no urgency or frequency.  No flank pain.  MS:  No joint pain or swelling.  No decreased range of motion.  No back pain.  Psych:  No change in mood or affect. No depression or anxiety.  No memory loss.     Objective:   Physical Exam Filed Vitals:   04/02/14 0906  BP: 130/78  Pulse: 73  Temp: 98.1 F (36.7 C)  TempSrc: Oral  Height: 5\' 1"  (1.549 m)  Weight: 171 lb 6.4 oz (77.747 kg)  SpO2: 96%    Gen: Pleasant, well-nourished, in no distress,  normal affect  ENT: No lesions,  mouth clear,  oropharynx clear, no postnasal drip, bilateral nares purulence  Neck: No JVD, no TMG, no carotid bruits  Lungs: No use of accessory muscles, no dullness to percussion, expir wheezes, poor airflow  Cardiovascular: RRR, heart sounds normal, no murmur or gallops, no peripheral edema  Abdomen: soft and NT, no HSM,  BS normal  Musculoskeletal: No deformities, no cyanosis or clubbing  Neuro: alert, non focal  Skin: Warm, no lesions or rashes  No results found.     Assessment & Plan:   Severe persistent asthma with acute exacerbation Severe persistent asthma with acute on chronic sinusitis and acute flare Plan Take cipro 750 twice daily for 10days Increase prednisone 10mg   Take 4 for four days 3 for four days 2 for four days then one daily and STAY No other medication changes CT of sinus will be obtained Return 1 month      Updated Medication List Outpatient Encounter Prescriptions as of 04/02/2014  Medication Sig  . acetaminophen (TYLENOL) 500 MG tablet Take 1,000 mg by mouth every 6 (six) hours as needed for pain or fever.   [name] Kitchen acyclovir (ZOVIRAX) 400 MG tablet Take 1 tablet (400 mg total) by mouth 2 (two) times daily.  [name] Kitchen albuterol (PROVENTIL) (2.5 MG/3ML) 0.083% nebulizer solution Take 2.5 mg by nebulization every 4 (four) hours as needed for wheezing or shortness of breath.  . citalopram (CELEXA) 20 MG tablet  Take 20 mg by mouth every morning.    . fluticasone (FLONASE) 50 MCG/ACT nasal spray Place 2 sprays into the nose 2 (two) times daily.  . furosemide (LASIX) 20 MG tablet 20 mg as needed.  . gabapentin (NEURONTIN) 300 MG capsule Take 1 capsule (300 mg total) by mouth 4 (four) times daily.  . mometasone-formoterol (DULERA) 200-5 MCG/ACT AERO Inhale 2 puffs into the lungs 2 (two) times daily.  [name] Kitchen omeprazole (PRILOSEC) 20 MG capsule Take 1 capsule (20 mg total) by mouth 2 (two) times daily before a meal.  . predniSONE (DELTASONE) 10 MG tablet Take 4 for four days 3 for four days 2 for four days then 1 daily and STAY  . simvastatin (ZOCOR) 80 MG tablet Take 80 mg by mouth at bedtime.    . VENTOLIN HFA 108 (90 BASE) MCG/ACT inhaler INHALE 2 PUFFS INTO THE LUNGS EVERY 6 HOURS AS NEEDED.  [name] Kitchen vitamin C (ASCORBIC ACID) 500 MG tablet Take 500 mg by mouth 2 (two) times daily.  [name] Kitchen warfarin (COUMADIN) 10 MG tablet Take 5 mg by mouth every evening.   . zafirlukast (ACCOLATE) 20 MG tablet TAKE 1 TABLET BY MOUTH TWICE DAILY.  . [DISCONTINUED] predniSONE (DELTASONE) 10 MG tablet TAKE 1/2 TABLET BY MOUTH DAILY.  . ciprofloxacin (CIPRO) 750 MG tablet Take 1 tablet (750 mg total) by mouth 2 (two) times daily.  . [DISCONTINUED] simvastatin (ZOCOR) 40 MG tablet daily.

## 2014-04-02 NOTE — Patient Instructions (Signed)
Take cipro 750 twice daily for 10days Increase prednisone 10mg  Take 4 for four days 3 for four days 2 for four days then one daily and STAY No other medication changes CT of sinus will be obtained Return 1 month

## 2014-04-03 NOTE — Assessment & Plan Note (Signed)
Severe persistent asthma with acute on chronic sinusitis and acute flare Plan Take cipro 750 twice daily for 10days Increase prednisone 10mg  Take 4 for four days 3 for four days 2 for four days then one daily and STAY No other medication changes CT of sinus will be obtained Return 1 month

## 2014-04-04 ENCOUNTER — Ambulatory Visit (INDEPENDENT_AMBULATORY_CARE_PROVIDER_SITE_OTHER)
Admission: RE | Admit: 2014-04-04 | Discharge: 2014-04-04 | Disposition: A | Discharge status: Home / Self Care | Payer: Medicare Other | Source: Ambulatory Visit | Attending: Critical Care Medicine | Admitting: Critical Care Medicine

## 2014-04-04 DIAGNOSIS — J0191 Acute recurrent sinusitis, unspecified: Secondary | ICD-10-CM

## 2014-04-08 ENCOUNTER — Encounter (HOSPITAL_COMMUNITY): Payer: Medicare Other | Attending: Hematology and Oncology | Admitting: Hematology & Oncology

## 2014-04-08 ENCOUNTER — Encounter (HOSPITAL_COMMUNITY): Payer: Medicare Other | Attending: Hematology & Oncology

## 2014-04-08 ENCOUNTER — Encounter (HOSPITAL_COMMUNITY): Payer: Self-pay | Admitting: Hematology & Oncology

## 2014-04-08 ENCOUNTER — Encounter (HOSPITAL_BASED_OUTPATIENT_CLINIC_OR_DEPARTMENT_OTHER): Payer: Medicare Other

## 2014-04-08 VITALS — BP 113/94 | HR 80 | Temp 98.1°F | Resp 20 | Wt 172.1 lb

## 2014-04-08 DIAGNOSIS — D869 Sarcoidosis, unspecified: Secondary | ICD-10-CM | POA: Insufficient documentation

## 2014-04-08 DIAGNOSIS — C8590 Non-Hodgkin lymphoma, unspecified, unspecified site: Secondary | ICD-10-CM | POA: Insufficient documentation

## 2014-04-08 DIAGNOSIS — D800 Hereditary hypogammaglobulinemia: Secondary | ICD-10-CM | POA: Diagnosis not present

## 2014-04-08 DIAGNOSIS — D801 Nonfamilial hypogammaglobulinemia: Secondary | ICD-10-CM | POA: Insufficient documentation

## 2014-04-08 DIAGNOSIS — J45998 Other asthma: Secondary | ICD-10-CM | POA: Diagnosis not present

## 2014-04-08 LAB — CBC WITH DIFFERENTIAL/PLATELET
BASOS ABS: 0 10*3/uL (ref 0.0–0.1)
Basophils Relative: 0 % (ref 0–1)
Eosinophils Absolute: 0 10*3/uL (ref 0.0–0.7)
Eosinophils Relative: 0 % (ref 0–5)
HEMATOCRIT: 39 % (ref 36.0–46.0)
HEMOGLOBIN: 11.9 g/dL — AB (ref 12.0–15.0)
LYMPHS PCT: 5 % — AB (ref 12–46)
Lymphs Abs: 0.5 10*3/uL — ABNORMAL LOW (ref 0.7–4.0)
MCH: 27.1 pg (ref 26.0–34.0)
MCHC: 30.5 g/dL (ref 30.0–36.0)
MCV: 88.8 fL (ref 78.0–100.0)
MONO ABS: 0.2 10*3/uL (ref 0.1–1.0)
MONOS PCT: 1 % — AB (ref 3–12)
Neutro Abs: 10.9 10*3/uL — ABNORMAL HIGH (ref 1.7–7.7)
Neutrophils Relative %: 94 % — ABNORMAL HIGH (ref 43–77)
Platelets: 192 10*3/uL (ref 150–400)
RBC: 4.39 MIL/uL (ref 3.87–5.11)
RDW: 15.5 % (ref 11.5–15.5)
WBC: 11.6 10*3/uL — AB (ref 4.0–10.5)

## 2014-04-08 LAB — COMPREHENSIVE METABOLIC PANEL
ALT: 14 U/L (ref 0–35)
AST: 20 U/L (ref 0–37)
Albumin: 3.8 g/dL (ref 3.5–5.2)
Alkaline Phosphatase: 101 U/L (ref 39–117)
Anion gap: 11 (ref 5–15)
BILIRUBIN TOTAL: 0.5 mg/dL (ref 0.3–1.2)
BUN: 22 mg/dL (ref 6–23)
CALCIUM: 8.6 mg/dL (ref 8.4–10.5)
CO2: 27 mmol/L (ref 19–32)
CREATININE: 1.19 mg/dL — AB (ref 0.50–1.10)
Chloride: 102 mmol/L (ref 96–112)
GFR, EST AFRICAN AMERICAN: 57 mL/min — AB (ref >90)
GFR, EST NON AFRICAN AMERICAN: 49 mL/min — AB (ref >90)
Glucose, Bld: 154 mg/dL — ABNORMAL HIGH (ref 70–99)
Potassium: 3.8 mmol/L (ref 3.5–5.1)
SODIUM: 140 mmol/L (ref 135–145)
Total Protein: 7.1 g/dL (ref 6.0–8.3)

## 2014-04-08 LAB — PROTIME-INR
INR: 2.23 — ABNORMAL HIGH (ref 0.00–1.49)
PROTHROMBIN TIME: 24.9 s — AB (ref 11.6–15.2)

## 2014-04-08 LAB — LACTATE DEHYDROGENASE: LDH: 183 U/L (ref 94–250)

## 2014-04-08 MED ORDER — SODIUM CHLORIDE 0.9 % IV SOLN
Freq: Once | INTRAVENOUS | Status: AC
  Administered 2014-04-08: 10:00:00 via INTRAVENOUS

## 2014-04-08 MED ORDER — SODIUM CHLORIDE 0.9 % IJ SOLN
10.0000 mL | INTRAMUSCULAR | Status: DC | PRN
  Administered 2014-04-08: 10 mL
  Filled 2014-04-08: qty 10

## 2014-04-08 MED ORDER — IMMUNE GLOBULIN (HUMAN) 10 GM/100ML IV SOLN
50.0000 g | Freq: Once | INTRAVENOUS | Status: AC
  Administered 2014-04-08: 50 g via INTRAVENOUS
  Filled 2014-04-08: qty 500

## 2014-04-08 NOTE — Patient Instructions (Signed)
[place] at [place] Discharge Instructions  RECOMMENDATIONS MADE BY THE CONSULTANT AND ANY TEST RESULTS WILL BE SENT TO YOUR REFERRING PHYSICIAN.  IVIG today as ordered. We will continue lab work and IVIG infusion every 4 weeks. MD appointment again in 3 months. Report any issues/concerns to clinic as needed prior to appointments.  Thank you for choosing [place] at [facility] to provide your oncology and hematology care.  To afford each patient quality time with our provider, please arrive at least 15 minutes before your scheduled appointment time.    You need to re-schedule your appointment should you arrive 10 or more minutes late.  We strive to give you quality time with our providers, and arriving late affects you and other patients whose appointments are after yours.  Also, if you no show three or more times for appointments you may be dismissed from the clinic at the providers discretion.     Again, thank you for choosing [facility].  Our hope is that these requests will decrease the amount of time that you wait before being seen by our physicians.       _____________________________________________________________  Should you have questions after your visit to [facility], please contact our office at [phone] between the hours of 8:30 a.m. and 4:30 p.m.  Voicemails left after 4:30 p.m. will not be returned until the following business day.  For prescription refill requests, have your pharmacy contact our office.

## 2014-04-08 NOTE — Progress Notes (Signed)
[name]., MD [place] [address]    Hypogammaglobulenemia/IgG deficiency with level of 414 mg/dl  Maintenance Rituxan 09/30/2011 Hx of Diffuse large B-Cell Lymphoma, S/P R-CHOP x 4 cycles with intrathecal chemotherapy during cycles 3 and 4 with methotrexate and Solu-Cortef. Treatment was stopped due to Pseudomonas sepsis after cycles 2 and 4 which was very severe  CURRENT THERAPY:50 g IV IgG monthly for IgG deficiency.  INTERVAL HISTORY: [name] 59 y.o. female returns for IgG deficiency.  She is due for IVIG today. She has followed up with her pulmonologist. She notes that she is doing much better. She is on an antibiotic and states that increased dose of her prednisone has helped markedly. Her cough is noticeably improved. She continues to question whether or not to take a break from her IVIG but knows that based on her current ongoing pulmonary and sinus problems she should stay on it. She has no major complaints today.  MEDICAL HISTORY: Past Medical History  Diagnosis Date  . Non Hodgkin's lymphoma   . Allergic rhinitis   . GERD (gastroesophageal reflux disease)   . Asthma   . Chronic sinusitis   . Respiratory failure   . Cavitary lung disease   . PONV (postoperative nausea and vomiting)   . Endotracheally intubated   . Myocardial infarction 2011  . Anxiety   . Chronic kidney disease   . Anemia   . Pneumonia 01/2013  . 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     No history exists.     is allergic to meperidine hcl  and montelukast sodium.  [name] does not currently have medications on file.  SURGICAL HISTORY: Past Surgical History  Procedure Laterality Date  . Vesicovaginal fistula closure w/ tah    . Nasal sinus surgery    . Neck surgery      x 2   . Portacath placement  7/11    Removed 6/12  . Basal cell carcinoma excision  03/2011    scalp  . Port-a-cath removal    . Peripherally inserted central catheter insertion    . Picc removal    . Tracheostomy    . Abdominal hysterectomy    . Lung lobectomy      right  . Breast surgery Right   . Colonoscopy N/A 11/14/2012    Procedure: COLONOSCOPY;  Surgeon: Rogene Houston, MD;  Location: [facility];  Service: Endoscopy;  Laterality: N/A;  830    SOCIAL HISTORY: History   Social History  . Marital Status: Divorced    Spouse Name: N/A  . Number of Children: 3  . Years of Education: N/A   Occupational History  . [employer] History Main Topics  . Smoking status: Never Smoker   . Smokeless tobacco: Never Used  . Alcohol Use: No  . Drug Use: No  . Sexual Activity: No   Other Topics Concern  . Not on file   Social History Narrative    FAMILY HISTORY: Family History  Problem Relation Age of Onset  . Emphysema Mother   . Allergies Father   .  Asthma Father     as a child  . Leukemia Maternal Grandmother   . Diabetes Brother   . Stroke Mother     Review of Systems  Constitutional: Negative for fever, chills, weight loss and malaise/fatigue.  HENT: Negative for congestion, hearing loss, nosebleeds, sore throat and tinnitus.   Eyes: Negative for blurred vision, double vision, pain and discharge.  Respiratory: Negative for cough, hemoptysis, sputum production, shortness of breath and wheezing.   Cardiovascular: Negative for chest pain, palpitations, claudication, leg swelling and PND.  Gastrointestinal: Negative for heartburn, nausea, vomiting, abdominal pain, diarrhea, constipation, blood in stool  and melena.  Genitourinary: Negative for dysuria, urgency, frequency and hematuria.  Musculoskeletal: Negative for myalgias, joint pain and falls.  Skin: Negative for itching and rash.  Neurological: Negative for dizziness, tingling, tremors, sensory change, speech change, focal weakness, seizures, loss of consciousness, weakness and headaches.  Endo/Heme/Allergies: Does not bruise/bleed easily.  Psychiatric/Behavioral: Negative for depression, suicidal ideas, memory loss and substance abuse. The patient is not nervous/anxious and does not have insomnia.     PHYSICAL EXAMINATION  ECOG PERFORMANCE STATUS: 0 - Asymptomatic  There were no vitals filed for this visit.  Physical Exam  Constitutional: She is oriented to person, place, and time and well-developed, well-nourished, and in no distress.  HENT:  Head: Normocephalic and atraumatic.  Nose: Nose normal.  Mouth/Throat: Oropharynx is clear and moist. No oropharyngeal exudate.  Eyes: Conjunctivae and EOM are normal. Pupils are equal, round, and reactive to light. Right eye exhibits no discharge. Left eye exhibits no discharge. No scleral icterus.  Neck: Normal range of motion. Neck supple. No tracheal deviation present. No thyromegaly present.  Cardiovascular: Normal rate, regular rhythm and normal heart sounds.  Exam reveals no gallop and no friction rub.   No murmur heard. Pulmonary/Chest: Effort normal. She has no wheezes. She has no rales.  Abdominal: Soft. Bowel sounds are normal. She exhibits no distension and no mass. There is no tenderness. There is no rebound and no guarding.  Musculoskeletal: Normal range of motion. She exhibits no edema.  Lymphadenopathy:    She has no cervical adenopathy.  Neurological: She is alert and oriented to person, place, and time. She has normal reflexes. No cranial nerve deficit. Gait normal. Coordination normal.  Skin: Skin is warm and dry. No rash noted.  Psychiatric: Mood, memory, affect and  judgment normal.  Nursing note and vitals reviewed.   LABORATORY DATA:  CBC    Component Value Date/Time   WBC 8.5 03/11/2014 0836   RBC 3.92 03/11/2014 0836   RBC 3.71* 01/10/2014 0903   HGB 10.6* 03/11/2014 0836   HCT 34.5* 03/11/2014 0836   PLT 168 03/11/2014 0836   MCV 88.0 03/11/2014 0836   MCH 27.0 03/11/2014 0836   MCHC 30.7 03/11/2014 0836   RDW 14.6 03/11/2014 0836   LYMPHSABS 0.7 03/11/2014 0836   MONOABS 0.5 03/11/2014 0836   EOSABS 0.2 03/11/2014 0836   BASOSABS 0.0 03/11/2014 0836   CMP     Component Value Date/Time   NA 138 03/11/2014 0836   K 3.9 03/11/2014 0836   CL 103 03/11/2014 0836   CO2 28 03/11/2014 0836   GLUCOSE 182* 03/11/2014 0836   BUN 13 03/11/2014 0836   CREATININE 1.28* 03/11/2014 0836   CALCIUM 8.5 03/11/2014 0836   PROT 6.6 03/11/2014 0836   ALBUMIN 3.3* 03/11/2014 0836   AST 17 03/11/2014 0836   ALT 12 03/11/2014 0836   ALKPHOS 87 03/11/2014 0836  BILITOT 0.5 03/11/2014 0836   GFRNONAA 45* 03/11/2014 0836   GFRAA 52* 03/11/2014 0836     RADIOLOGY:  EXAM: CT CHEST WITHOUT CONTRAST  TECHNIQUE: Multidetector CT imaging of the chest was performed following the standard protocol without IV contrast.  COMPARISON: 06/24/2013 PET scan  FINDINGS: Stable mild bilateral lower lobe bronchitic change. In the medial right lower lobe there is mild reticular nodular infiltrate, new from prior study. Mild linear densities lateral right upper lobe image number 26, likely representing scarring as it is not significantly changed.  Subsegmental atelectasis inferior lateral lingula similar to prior study. The previously described rounded opacity in the left lower lobe that measured 8 x 8 mm is not seen current study. Mild right lung base scarring and atelectasis is very similar to the prior study.  Thoracic inlet and thyroid are normal. Small mediastinal lymph nodes, none of which are pathologic by size, not  significantly different from prior study. No pleural or pericardial effusion. No acute musculoskeletal findings.  IMPRESSION: 1. Interval resolution of 8 mm pulmonary nodule left lower lobe of 2. Interval development of mild reticular nodular infiltrate medially in the right lower lobe seen on images 37 through 42. This could represent infectious or inflammatory etiology, although malignancy including lymphangitic tumor spread is not excluded.   Electronically Signed  By: Skipper Cliche M.D.  On: 01/16/2014 10:53   ASSESSMENT and THERAPY PLAN:   Hypogammaglobulinemia Asthma  She will continue with IVIG. She has seen her pulmonologist and is currently doing much better; her pulmonary exam is improved. We discussed continuing her IVIG for now. I will see her back in 3 months if she is markedly improved we could certainly consider a break if that is what she truly desires. We discussed the risks and benefits associated with that.  Diffuse large B-cell lymphoma  She remains without evidence of obvious recurrence. We will continue ongoing observation.   All questions were answered. The patient knows to call the clinic with any problems, questions or concerns. We can certainly see the patient much sooner if necessary. This note was signed electronically  Molli Hazard 04/08/2014

## 2014-04-08 NOTE — Progress Notes (Signed)
Patient reports taking Tylenol and benadryl at home today prior to appointment. Tolerated infusion well.

## 2014-04-08 NOTE — Progress Notes (Signed)
Labs drawn

## 2014-04-08 NOTE — Patient Instructions (Signed)
[place] at [facility]  Discharge Instructions:  You saw Dr Whitney Muse today.  IVIG today.   IVIG every 4 weeks. Return to see the doctor in 3 months. Please call the clinic if you have any questions or concerns _______________________________________________________________  Thank you for choosing [place] at [facility] to provide your oncology and hematology care.  To afford each patient quality time with our providers, please arrive at least 15 minutes before your scheduled appointment.  You need to re-schedule your appointment if you arrive 10 or more minutes late.  We strive to give you quality time with our providers, and arriving late affects you and other patients whose appointments are after yours.  Also, if you no show three or more times for appointments you may be dismissed from the clinic.  Again, thank you for choosing [place] [facility] [place] hope is that these requests will allow you access to exceptional care and in a timely manner. _______________________________________________________________  If you have questions after your visit, please contact our office at [phone] between the hours of 8:30 a.m. and 5:00 p.m. Voicemails left after 4:30 p.m. will not be returned until the following business day. _______________________________________________________________  For prescription refill requests, have your pharmacy contact our office. _______________________________________________________________  Recommendations made by the consultant and any test results will be sent to your referring physician. _______________________________________________________________

## 2014-05-06 ENCOUNTER — Encounter: Payer: Self-pay | Admitting: Adult Health

## 2014-05-06 ENCOUNTER — Ambulatory Visit (INDEPENDENT_AMBULATORY_CARE_PROVIDER_SITE_OTHER): Payer: Medicare Other | Admitting: Adult Health

## 2014-05-06 ENCOUNTER — Ambulatory Visit (INDEPENDENT_AMBULATORY_CARE_PROVIDER_SITE_OTHER)
Admission: RE | Admit: 2014-05-06 | Discharge: 2014-05-06 | Disposition: A | Discharge status: Home / Self Care | Payer: Medicare Other | Source: Ambulatory Visit | Attending: Adult Health | Admitting: Adult Health

## 2014-05-06 ENCOUNTER — Ambulatory Visit (HOSPITAL_COMMUNITY): Payer: Medicare Other

## 2014-05-06 VITALS — BP 122/74 | HR 74 | Temp 98.3°F | Ht 61.0 in | Wt 168.8 lb

## 2014-05-06 DIAGNOSIS — J4551 Severe persistent asthma with (acute) exacerbation: Secondary | ICD-10-CM

## 2014-05-06 DIAGNOSIS — J329 Chronic sinusitis, unspecified: Secondary | ICD-10-CM | POA: Diagnosis not present

## 2014-05-06 NOTE — Assessment & Plan Note (Signed)
Exacerbation -now resolved  Frequent flares with underlying chronic sinus dz and hypogammaglobulinemia  Hopefully the IVIG will help .  Cont on dulera  Remain on prednisone 5mg  daily   Plan  Chest xray today  Follow up with ENT tomorrow as planned .  Flutter valve 3-4 times a day  Fluids and rest  Saline nasal rinses As needed   Please contact office for sooner follow up if symptoms do not improve or worsen or seek emergency care  Place on call back list for ov with Dr. Joya Gaskins  In 2 months and As needed

## 2014-05-06 NOTE — Patient Instructions (Signed)
Chest xray today  Follow up with ENT tomorrow as planned .  Flutter valve 3-4 times a day  Fluids and rest  Saline nasal rinses As needed   Please contact office for sooner follow up if symptoms do not improve or worsen or seek emergency care  Place on call back list for ov with Dr. Joya Gaskins  In 2 months and As needed

## 2014-05-06 NOTE — Assessment & Plan Note (Signed)
Recent acute on chronic sinusitis  Slowly improving after 10 d course of cipro  Has ENT follow up tomorrow  Cont w/ sinus regimen .

## 2014-05-06 NOTE — Progress Notes (Signed)
   Subjective:    Patient ID: [name], female    DOB: [birth year], 59 y.o.   MRN: [id number]  HPI 59 yo female never smoker with severe persistent asthma-chronic steroids , chronic sinusitis, hypogammaglobulinemia on monthly IVIG (f/by Hem) , Non Hodgkins Lymphoma  05/06/2014 Follow up : Asthma, sinusitis  Pt returns for follow up 1 month follow up .  Had recent flare of asthma and sinusitis  Has hx of psuedomonas .  She was tx with cipro x 10 d.  CT sinus did show diffuse acute on chronic sinusitis .  She is feeling better but has daily cough with thick white mucus.  Uses flutter valve and mucinex most days.  No fever, chest pain, orthopnea, edema, hemoptysis.  Under a lot of stress with medical bills, support provided.   Followed by Hematology for Diffuse large B cell lymphoma -currently in remission (s/p CHOP )  On IVIG for hypogammaglobulinemia .   Review of Systems Constitutional:   No  weight loss, night sweats,  Fevers, chills, + fatigue, or  lassitude.  HEENT:   No headaches,  Difficulty swallowing,  Tooth/dental problems, or  Sore throat,                No sneezing, itching, ear ache,  +nasal congestion, post nasal drip,   CV:  No chest pain,  Orthopnea, PND, swelling in lower extremities, anasarca, dizziness, palpitations, syncope.   GI  No heartburn, indigestion, abdominal pain, nausea, vomiting, diarrhea, change in bowel habits, loss of appetite, bloody stools.   Resp: N .  No chest wall deformity  Skin: no rash or lesions.  GU: no dysuria, change in color of urine, no urgency or frequency.  No flank pain, no hematuria   MS:  No joint pain or swelling.  No decreased range of motion.  No back pain.  Psych:  No change in mood or affect. No depression or anxiety.  No memory loss.         Objective:   Physical Exam GEN: A/Ox3; pleasant , NAD, well nourished   HEENT:  [place]/AT,  EACs-clear, TMs-wnl, NOSE-clear drainage, non tender sinus,  THROAT-clear, no  lesions, no postnasal drip or exudate noted.   NECK:  Supple w/ fair ROM; no JVD; normal carotid impulses w/o bruits; no thyromegaly or nodules palpated; no lymphadenopathy.  RESP  Scattered rhonchi , no accessory muscle use, no dullness to percussion  CARD:  RRR, no m/r/g  , no peripheral edema, pulses intact, no cyanosis or clubbing.  GI:   Soft & nt; nml bowel sounds; no organomegaly or masses detected.  Musco: Warm bil, no deformities or joint swelling noted.   Neuro: alert, no focal deficits noted.    Skin: Warm, no lesions or rashes   05/06/2014 CT sinus >diffuse acute on chronic sinusitis  Reviewed independently and agree   05/06/2014 CXR pending >>      Assessment & Plan:

## 2014-05-08 ENCOUNTER — Encounter (HOSPITAL_COMMUNITY): Payer: Medicare Other | Attending: Hematology and Oncology

## 2014-05-08 VITALS — BP 132/68 | HR 60 | Temp 98.1°F | Resp 20 | Wt 164.8 lb

## 2014-05-08 DIAGNOSIS — D801 Nonfamilial hypogammaglobulinemia: Secondary | ICD-10-CM

## 2014-05-08 DIAGNOSIS — C8590 Non-Hodgkin lymphoma, unspecified, unspecified site: Secondary | ICD-10-CM | POA: Insufficient documentation

## 2014-05-08 DIAGNOSIS — D869 Sarcoidosis, unspecified: Secondary | ICD-10-CM | POA: Insufficient documentation

## 2014-05-08 DIAGNOSIS — D800 Hereditary hypogammaglobulinemia: Secondary | ICD-10-CM | POA: Insufficient documentation

## 2014-05-08 MED ORDER — IMMUNE GLOBULIN (HUMAN) 10 GM/100ML IV SOLN
50.0000 g | Freq: Once | INTRAVENOUS | Status: AC
  Administered 2014-05-08: 50 g via INTRAVENOUS
  Filled 2014-05-08: qty 300

## 2014-05-08 MED ORDER — SODIUM CHLORIDE 0.9 % IV SOLN
Freq: Once | INTRAVENOUS | Status: AC
  Administered 2014-05-08: 10:00:00 via INTRAVENOUS

## 2014-05-08 MED ORDER — SODIUM CHLORIDE 0.9 % IJ SOLN: 10.0000 mL | INTRAMUSCULAR | Status: DC | PRN

## 2014-05-08 NOTE — Progress Notes (Signed)
[name] Tolerated IVIG infusion well today Discharged ambulatory

## 2014-05-08 NOTE — Patient Instructions (Signed)
[facility] Discharge Instructions for Patients Receiving Chemotherapy  Today you received the following chemotherapy agents IVIG Follow up as scheduled Call the clinic if you have any questions or concerns  To help prevent nausea and vomiting after your treatment, we encourage you to take your nausea medication    If you develop nausea and vomiting, or diarrhea that is not controlled by your medication, call the clinic.  The clinic phone number is [phone]. Office hours are Monday-Friday 8:30am-5:00pm.  BELOW ARE SYMPTOMS THAT SHOULD BE REPORTED IMMEDIATELY:  *FEVER GREATER THAN 101.0 F  *CHILLS WITH OR WITHOUT FEVER  NAUSEA AND VOMITING THAT IS NOT CONTROLLED WITH YOUR NAUSEA MEDICATION  *UNUSUAL SHORTNESS OF BREATH  *UNUSUAL BRUISING OR BLEEDING  TENDERNESS IN MOUTH AND THROAT WITH OR WITHOUT PRESENCE OF ULCERS  *URINARY PROBLEMS  *BOWEL PROBLEMS  UNUSUAL RASH Items with * indicate a potential emergency and should be followed up as soon as possible. If you have an emergency after office hours please contact your primary care physician or go to the nearest emergency department.  Please call the clinic during office hours if you have any questions or concerns.   You may also contact the Patient Navigator at [phone] should you have any questions or need assistance in obtaining follow up care. _____________________________________________________________________ Have you asked about our STAR program?    STAR stands for Survivorship Training and Rehabilitation, and this is a nationally recognized cancer care program that focuses on survivorship and rehabilitation.  Cancer and cancer treatments may cause problems, such as, pain, making you feel tired and keeping you from doing the things that you need or want to do. Cancer rehabilitation can help. Our goal is to reduce these troubling effects and help you have the best quality of life possible.  You  may receive a survey from a nurse that asks questions about your current state of health.  Based on the survey results, all eligible patients will be referred to the [facility] program for an evaluation so we can better serve you! A frequently asked questions sheet is available upon request.

## 2014-05-12 NOTE — Progress Notes (Signed)
Quick Note:  Spoke with pt. OV scheduled with PW on Monday, May 16. Pt to arrive at 8:45 am. She confirmed appt, is to call back if symptoms worsen or do not improve prior to this, and voiced no further questions or concerns at this time. ______

## 2014-05-19 ENCOUNTER — Encounter: Payer: Self-pay | Admitting: Critical Care Medicine

## 2014-05-19 ENCOUNTER — Ambulatory Visit (INDEPENDENT_AMBULATORY_CARE_PROVIDER_SITE_OTHER): Payer: Medicare Other | Admitting: Critical Care Medicine

## 2014-05-19 ENCOUNTER — Telehealth: Payer: Self-pay | Admitting: *Deleted

## 2014-05-19 ENCOUNTER — Ambulatory Visit (INDEPENDENT_AMBULATORY_CARE_PROVIDER_SITE_OTHER)
Admission: RE | Admit: 2014-05-19 | Discharge: 2014-05-19 | Disposition: A | Discharge status: Home / Self Care | Payer: Medicare Other | Source: Ambulatory Visit | Attending: Critical Care Medicine | Admitting: Critical Care Medicine

## 2014-05-19 VITALS — BP 124/86 | HR 65 | Temp 98.9°F | Ht 62.0 in | Wt 169.4 lb

## 2014-05-19 DIAGNOSIS — J4551 Severe persistent asthma with (acute) exacerbation: Secondary | ICD-10-CM

## 2014-05-19 DIAGNOSIS — J324 Chronic pansinusitis: Secondary | ICD-10-CM | POA: Diagnosis not present

## 2014-05-19 MED ORDER — LEVOFLOXACIN 750 MG PO TABS: 750.0000 mg | ORAL_TABLET | Freq: Every day | ORAL | Status: DC

## 2014-05-19 NOTE — Assessment & Plan Note (Signed)
Severe persistent asthma with acute exacerbation secondary to acute on chronic pan sinusitis Plan Cycle another 10 days of Levaquin 750 mg daily Maintain prednisone current maintenance dose Referral to ENT established Maintain inhaled medications as prescribed

## 2014-05-19 NOTE — Assessment & Plan Note (Signed)
Acute on chronic sinusitis with flare Plan Review asthma evaluation

## 2014-05-19 NOTE — Progress Notes (Signed)
Subjective:    Patient ID: [name], female    DOB: January 20, 1955, 59 y.o.   MRN: [id number]  HPI 05/19/2014 Chief Complaint  Patient presents with  . 2 wk follow up    with cxr.  Cough is now deeper with increased mucus.  Mucus yellow green during the day with small amount of brownish mucus mixed in 1-2 times in the morning.  Temp up to 100.  Wheezing, chest tightness, and SOB unchanged.  Notes a deep cough, green mucus, low grade fever to 100, no real chest pain. Notes some wheezing and dyspneic.  Notes frontal pain and nasal pain.  Uses neil med sinus rinse.  No qhs cough or dyspnea.    Current Medications, Allergies, Complete Past Medical History, Past Surgical History, Family History, and Social History were reviewed in [employer] record.  Past Medical History  Diagnosis Date  . Non Hodgkin's lymphoma   . Allergic rhinitis   . GERD (gastroesophageal reflux disease)   . Asthma   . Chronic sinusitis   . Respiratory failure   . Cavitary lung disease   . PONV (postoperative nausea and vomiting)   . Endotracheally intubated   . Myocardial infarction 2011  . Anxiety   . Chronic kidney disease   . Anemia   . Pneumonia 01/2013  . Hypogammaglobulinemia, acquired 12/30/2009    Qualifier: Diagnosis of  By: Joya Gaskins MD, Burnett Harry      Family History  Problem Relation Age of Onset  . Emphysema Mother   . Allergies Father   . Asthma Father     as a child  . Leukemia Maternal Grandmother   . Diabetes Brother   . Stroke Mother      History   Social History  . Marital Status: Divorced    Spouse Name: N/A  . Number of Children: 3  . Years of Education: N/A   Occupational History  . [place] History Main Topics  . Smoking status: Never Smoker   . Smokeless tobacco: Never Used  . Alcohol Use: No  . Drug Use: No  . Sexual Activity: No   Other Topics Concern  . Not on file   Social History Narrative     Allergies    Allergen Reactions  . Meperidine Hcl Anaphylaxis  . Montelukast Sodium Hives and Rash     Outpatient Prescriptions Prior to Visit  Medication Sig Dispense Refill  . acetaminophen (TYLENOL) 500 MG tablet Take 1,000 mg by mouth every 6 (six) hours as needed for pain or fever.     [name] Kitchen acyclovir (ZOVIRAX) 400 MG tablet Take 1 tablet (400 mg total) by mouth 2 (two) times daily. 60 tablet 3  . albuterol (PROVENTIL) (2.5 MG/3ML) 0.083% nebulizer solution Take 2.5 mg by nebulization every 4 (four) hours as needed for wheezing or shortness of breath.    . ciprofloxacin (CIPRO) 750 MG tablet   0  . citalopram (CELEXA) 20 MG tablet Take 20 mg by mouth every morning.      . fluticasone (FLONASE) 50 MCG/ACT nasal spray Place 2 sprays into the nose 2 (two) times daily. 16 g 6  . furosemide (LASIX) 20 MG tablet 20 mg as needed.  5  . gabapentin (NEURONTIN) 300 MG capsule Take 1 capsule (300 mg total) by mouth 4 (four) times daily. 120 capsule 3  . mometasone-formoterol (DULERA) 200-5 MCG/ACT AERO Inhale 2 puffs into the lungs 2 (two) times daily. 3  Inhaler 4  . omeprazole (PRILOSEC) 20 MG capsule Take 1 capsule (20 mg total) by mouth 2 (two) times daily before a meal. 180 capsule 4  . predniSONE (DELTASONE) 10 MG tablet Take 4 for four days 3 for four days 2 for four days then 1 daily and STAY 60 tablet 11  . simvastatin (ZOCOR) 80 MG tablet Take 80 mg by mouth at bedtime.      . VENTOLIN HFA 108 (90 BASE) MCG/ACT inhaler INHALE 2 PUFFS INTO THE LUNGS EVERY 6 HOURS AS NEEDED. 18 g 3  . vitamin C (ASCORBIC ACID) 500 MG tablet Take 500 mg by mouth 2 (two) times daily.    [name] Kitchen warfarin (COUMADIN) 10 MG tablet Take 5 mg by mouth every evening.     . zafirlukast (ACCOLATE) 20 MG tablet TAKE 1 TABLET BY MOUTH TWICE DAILY. 60 tablet 6   No facility-administered medications prior to visit.         Review of Systems  Constitutional: Positive for fever, chills, diaphoresis and fatigue. Negative for activity  change, appetite change and unexpected weight change.  HENT: Positive for postnasal drip and sinus pressure. Negative for congestion, dental problem, ear discharge, ear pain, facial swelling, hearing loss, mouth sores, nosebleeds, rhinorrhea, sneezing, sore throat, tinnitus, trouble swallowing and voice change.   Eyes: Negative for photophobia, discharge, itching and visual disturbance.  Respiratory: Positive for cough, chest tightness, shortness of breath and wheezing. Negative for apnea, choking and stridor.   Cardiovascular: Negative for chest pain, palpitations and leg swelling.  Gastrointestinal: Negative.  Negative for nausea, vomiting, abdominal pain, constipation, blood in stool and abdominal distention.  Genitourinary: Negative for dysuria, urgency, frequency, hematuria, flank pain, decreased urine volume and difficulty urinating.  Musculoskeletal: Negative for myalgias, back pain, joint swelling, arthralgias, gait problem, neck pain and neck stiffness.  Skin: Negative for color change, pallor and rash.  Neurological: Negative for dizziness, tremors, seizures, syncope, speech difficulty, weakness, light-headedness, numbness and headaches.  Hematological: Negative for adenopathy. Does not bruise/bleed easily.  Psychiatric/Behavioral: Negative for confusion, sleep disturbance and agitation. The patient is not nervous/anxious.        Objective:   Physical Exam  Constitutional: She appears well-developed and well-nourished. She is active.  Non-toxic appearance. She does not appear ill. No distress.  HENT:  Head: Normocephalic and atraumatic.  Nose: Mucosal edema, rhinorrhea and septal deviation present. No sinus tenderness or nasal deformity. No epistaxis. Right sinus exhibits maxillary sinus tenderness and frontal sinus tenderness. Left sinus exhibits maxillary sinus tenderness and frontal sinus tenderness.  Mouth/Throat: Oropharynx is clear and moist. No oropharyngeal exudate.  Eyes:  Conjunctivae and EOM are normal. Pupils are equal, round, and reactive to light. Right eye exhibits no discharge. Left eye exhibits no discharge. No scleral icterus.  Neck: Normal range of motion. Neck supple. Normal carotid pulses, no hepatojugular reflux and no JVD present. No tracheal tenderness and no muscular tenderness present. Carotid bruit is not present. No rigidity. No tracheal deviation, no edema, no erythema and normal range of motion present. No thyroid mass and no thyromegaly present.  Cardiovascular: Normal rate, regular rhythm, S1 normal, S2 normal, normal heart sounds, intact distal pulses and normal pulses.  PMI is not displaced.  Exam reveals no gallop, no S3, no S4, no distant heart sounds and no friction rub.   No murmur heard.  No systolic murmur is present   No diastolic murmur is present  Pulmonary/Chest: No accessory muscle usage or stridor. No apnea and no  tachypnea. No respiratory distress. She has no decreased breath sounds. She has wheezes in the right lower field and the left lower field. She has no rhonchi. She has no rales. Chest wall is not dull to percussion. She exhibits no mass, no tenderness, no bony tenderness and no deformity.  Abdominal: Soft. Normal appearance and bowel sounds are normal. She exhibits no distension, no ascites and no mass. There is no hepatosplenomegaly. There is no tenderness. There is no rigidity, no rebound, no guarding and no CVA tenderness.  Musculoskeletal: Normal range of motion.  Lymphadenopathy:       Head (right side): No submental and no submandibular adenopathy present.       Head (left side): No submental and no submandibular adenopathy present.    She has no cervical adenopathy.    She has no axillary adenopathy.  Neurological: She is alert. She has normal strength and normal reflexes. No cranial nerve deficit or sensory deficit.  Skin: Skin is warm and dry. No bruising, no ecchymosis, no lesion and no rash noted. She is not  diaphoretic. No cyanosis or erythema. No pallor. Nails show no clubbing.  Psychiatric: She has a normal mood and affect. Her speech is normal and behavior is normal.  Vitals reviewed.         Assessment & Plan:  I personally reviewed all images and lab data in the [facility] system as well as any outside material available during this office visit and agree with the  radiology impressions.  I also have reviewed any data /notes/records if available in care everywhere.  Severe persistent asthma with acute exacerbation Severe persistent asthma with acute exacerbation secondary to acute on chronic pan sinusitis Plan Cycle another 10 days of Levaquin 750 mg daily Maintain prednisone current maintenance dose Referral to ENT established Maintain inhaled medications as prescribed   Sinusitis, chronic Acute on chronic sinusitis with flare Plan Review asthma evaluation    [name] was seen today for 2 wk follow up.  Diagnoses and all orders for this visit:  Severe persistent asthma with acute exacerbation Orders: -     DG Chest 2 View; Future  Chronic pansinusitis  Other orders -     levofloxacin (LEVAQUIN) 750 MG tablet; Take 1 tablet (750 mg total) by mouth daily.    I had an extended discussion with the patient reviewing all relevant studies completed to date and  lasting 15 to 20 minutes of a 25 minute visit on the following ongoing concerns:   The issue of pansinusitis and need for ENT intervention and the importance of nasal hygiene

## 2014-05-19 NOTE — Telephone Encounter (Signed)
Elmo Putt from [facility]. Omeprazole was approved today for 1 year. They will send out letter also. 60 capsules for 30 days approved. [facility] ph # [phone]

## 2014-05-19 NOTE — Telephone Encounter (Signed)
Pharmacy and patient have been notified of approval valid thru 05/19/15.

## 2014-05-19 NOTE — Patient Instructions (Signed)
Take levaquin one daily HOLD Celexa while on levaquin You will need to get your protime checked while on levaquin Keep ENT appt No other changes Return 3 months

## 2014-05-19 NOTE — Telephone Encounter (Signed)
PA request received for Omeprazole 20 bid. Pt has longstanding history of using this. Ins asked if higher dose at less quantity would work. PA submitted allow up to 3 days for decision.

## 2014-06-03 ENCOUNTER — Ambulatory Visit (HOSPITAL_COMMUNITY): Payer: Medicare Other

## 2014-06-04 ENCOUNTER — Ambulatory Visit (HOSPITAL_COMMUNITY): Payer: Medicare Other

## 2014-06-06 ENCOUNTER — Encounter (HOSPITAL_COMMUNITY): Payer: Medicare Other | Attending: Hematology and Oncology

## 2014-06-06 VITALS — BP 118/63 | HR 80 | Temp 98.0°F | Resp 16 | Wt 170.6 lb

## 2014-06-06 DIAGNOSIS — D801 Nonfamilial hypogammaglobulinemia: Secondary | ICD-10-CM | POA: Diagnosis present

## 2014-06-06 DIAGNOSIS — D800 Hereditary hypogammaglobulinemia: Secondary | ICD-10-CM | POA: Diagnosis present

## 2014-06-06 DIAGNOSIS — C8590 Non-Hodgkin lymphoma, unspecified, unspecified site: Secondary | ICD-10-CM | POA: Diagnosis present

## 2014-06-06 DIAGNOSIS — D869 Sarcoidosis, unspecified: Secondary | ICD-10-CM | POA: Diagnosis present

## 2014-06-06 LAB — CBC WITH DIFFERENTIAL/PLATELET
BASOS ABS: 0 10*3/uL (ref 0.0–0.1)
BASOS PCT: 0 % (ref 0–1)
Eosinophils Absolute: 0.3 10*3/uL (ref 0.0–0.7)
Eosinophils Relative: 3 % (ref 0–5)
HCT: 36.9 % (ref 36.0–46.0)
Hemoglobin: 11.4 g/dL — ABNORMAL LOW (ref 12.0–15.0)
LYMPHS PCT: 7 % — AB (ref 12–46)
Lymphs Abs: 0.7 10*3/uL (ref 0.7–4.0)
MCH: 27.1 pg (ref 26.0–34.0)
MCHC: 30.9 g/dL (ref 30.0–36.0)
MCV: 87.6 fL (ref 78.0–100.0)
Monocytes Absolute: 0.6 10*3/uL (ref 0.1–1.0)
Monocytes Relative: 7 % (ref 3–12)
Neutro Abs: 7.7 10*3/uL (ref 1.7–7.7)
Neutrophils Relative %: 83 % — ABNORMAL HIGH (ref 43–77)
Platelets: 166 10*3/uL (ref 150–400)
RBC: 4.21 MIL/uL (ref 3.87–5.11)
RDW: 15.8 % — ABNORMAL HIGH (ref 11.5–15.5)
WBC: 9.4 10*3/uL (ref 4.0–10.5)

## 2014-06-06 LAB — COMPREHENSIVE METABOLIC PANEL
ALBUMIN: 3.5 g/dL (ref 3.5–5.0)
ALT: 16 U/L (ref 14–54)
ANION GAP: 10 (ref 5–15)
AST: 21 U/L (ref 15–41)
Alkaline Phosphatase: 85 U/L (ref 38–126)
BUN: 17 mg/dL (ref 6–20)
CALCIUM: 8.8 mg/dL — AB (ref 8.9–10.3)
CO2: 30 mmol/L (ref 22–32)
CREATININE: 1.15 mg/dL — AB (ref 0.44–1.00)
Chloride: 99 mmol/L — ABNORMAL LOW (ref 101–111)
GFR calc non Af Amer: 51 mL/min — ABNORMAL LOW (ref >60)
GFR, EST AFRICAN AMERICAN: 59 mL/min — AB (ref >60)
GLUCOSE: 114 mg/dL — AB (ref 65–99)
Potassium: 4 mmol/L (ref 3.5–5.1)
Sodium: 139 mmol/L (ref 135–145)
Total Bilirubin: 0.7 mg/dL (ref 0.3–1.2)
Total Protein: 6.7 g/dL (ref 6.5–8.1)

## 2014-06-06 LAB — LACTATE DEHYDROGENASE: LDH: 206 U/L — ABNORMAL HIGH (ref 98–192)

## 2014-06-06 MED ORDER — IMMUNE GLOBULIN (HUMAN) 10 GM/100ML IV SOLN
50.0000 g | Freq: Once | INTRAVENOUS | Status: AC
  Administered 2014-06-06: 50 g via INTRAVENOUS
  Filled 2014-06-06: qty 400

## 2014-06-06 MED ORDER — ACETAMINOPHEN 325 MG PO TABS: 650.0000 mg | ORAL_TABLET | Freq: Four times a day (QID) | ORAL | Status: DC | PRN

## 2014-06-06 MED ORDER — SODIUM CHLORIDE 0.9 % IV SOLN
Freq: Once | INTRAVENOUS | Status: AC
  Administered 2014-06-06: 11:00:00 via INTRAVENOUS

## 2014-06-06 MED ORDER — DIPHENHYDRAMINE HCL 25 MG PO CAPS: 25.0000 mg | ORAL_CAPSULE | Freq: Once | ORAL | Status: DC

## 2014-06-06 MED ORDER — SODIUM CHLORIDE 0.9 % IJ SOLN
10.0000 mL | INTRAMUSCULAR | Status: DC | PRN
  Administered 2014-06-06: 10 mL
  Filled 2014-06-06: qty 10

## 2014-06-06 NOTE — Progress Notes (Signed)
Patient reports she took tylenol and benadryl at home today prior to appointment.  Tolerated infusion well.

## 2014-06-06 NOTE — Patient Instructions (Signed)
[place] at [facility] Discharge Instructions  RECOMMENDATIONS MADE BY THE CONSULTANT AND ANY TEST RESULTS WILL BE SENT TO YOUR REFERRING PHYSICIAN.  Monthly IVIG infusion as ordered. Return as scheduled.  Thank you for choosing [place] at [facility] to provide your oncology and hematology care.  To afford each patient quality time with our provider, please arrive at least 15 minutes before your scheduled appointment time.    You need to re-schedule your appointment should you arrive 10 or more minutes late.  We strive to give you quality time with our providers, and arriving late affects you and other patients whose appointments are after yours.  Also, if you no show three or more times for appointments you may be dismissed from the clinic at the providers discretion.     Again, thank you for choosing [facility].  Our hope is that these requests will decrease the amount of time that you wait before being seen by our physicians.       _____________________________________________________________  Should you have questions after your visit to [facility], please contact our office at [phone] between the hours of 8:30 a.m. and 4:30 p.m.  Voicemails left after 4:30 p.m. will not be returned until the following business day.  For prescription refill requests, have your pharmacy contact our office.

## 2014-06-07 LAB — IGG, IGA, IGM
IgA: 52 mg/dL — ABNORMAL LOW (ref 87–352)
IgG (Immunoglobin G), Serum: 774 mg/dL (ref 700–1600)
IgM, Serum: 163 mg/dL (ref 26–217)

## 2014-06-26 ENCOUNTER — Other Ambulatory Visit (HOSPITAL_COMMUNITY): Payer: Self-pay | Admitting: Internal Medicine

## 2014-06-26 ENCOUNTER — Ambulatory Visit (HOSPITAL_COMMUNITY)
Admission: RE | Admit: 2014-06-26 | Discharge: 2014-06-26 | Disposition: A | Discharge status: Home / Self Care | Payer: Medicare Other | Source: Ambulatory Visit | Attending: Internal Medicine | Admitting: Internal Medicine

## 2014-06-26 DIAGNOSIS — M79605 Pain in left leg: Secondary | ICD-10-CM | POA: Diagnosis present

## 2014-06-26 DIAGNOSIS — R609 Edema, unspecified: Secondary | ICD-10-CM

## 2014-06-26 DIAGNOSIS — F4329 Adjustment disorder with other symptoms: Secondary | ICD-10-CM

## 2014-06-26 DIAGNOSIS — M7989 Other specified soft tissue disorders: Secondary | ICD-10-CM | POA: Diagnosis not present

## 2014-06-28 IMAGING — CR DG CHEST 2V
2 series · 2 of 2 positions shown · non-contrast
Comparison: 07/11/2012

CLINICAL DATA: Chest pain question mass/recurrence, past history
stage IV non-Hodgkin's lymphoma, asthma

EXAM:
CHEST  2 VIEW

[view not recorded (1 of 2)]
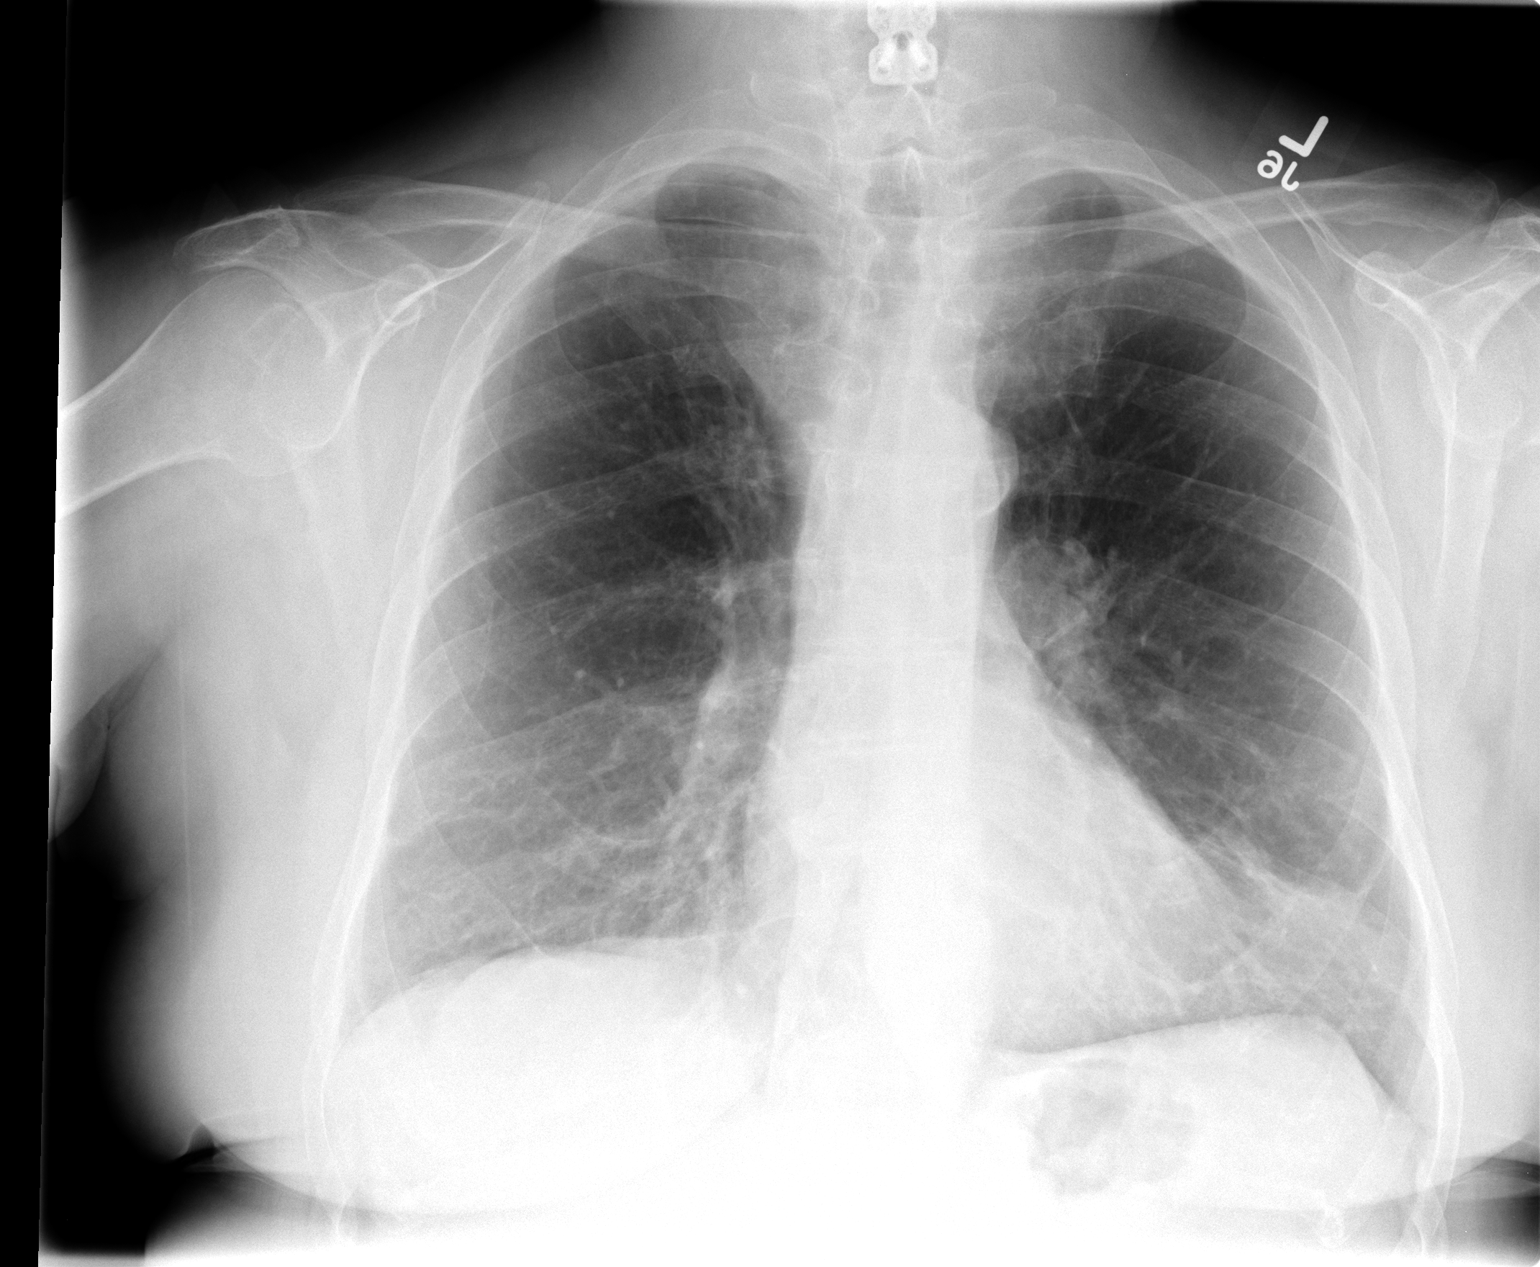

[view not recorded (2 of 2)]
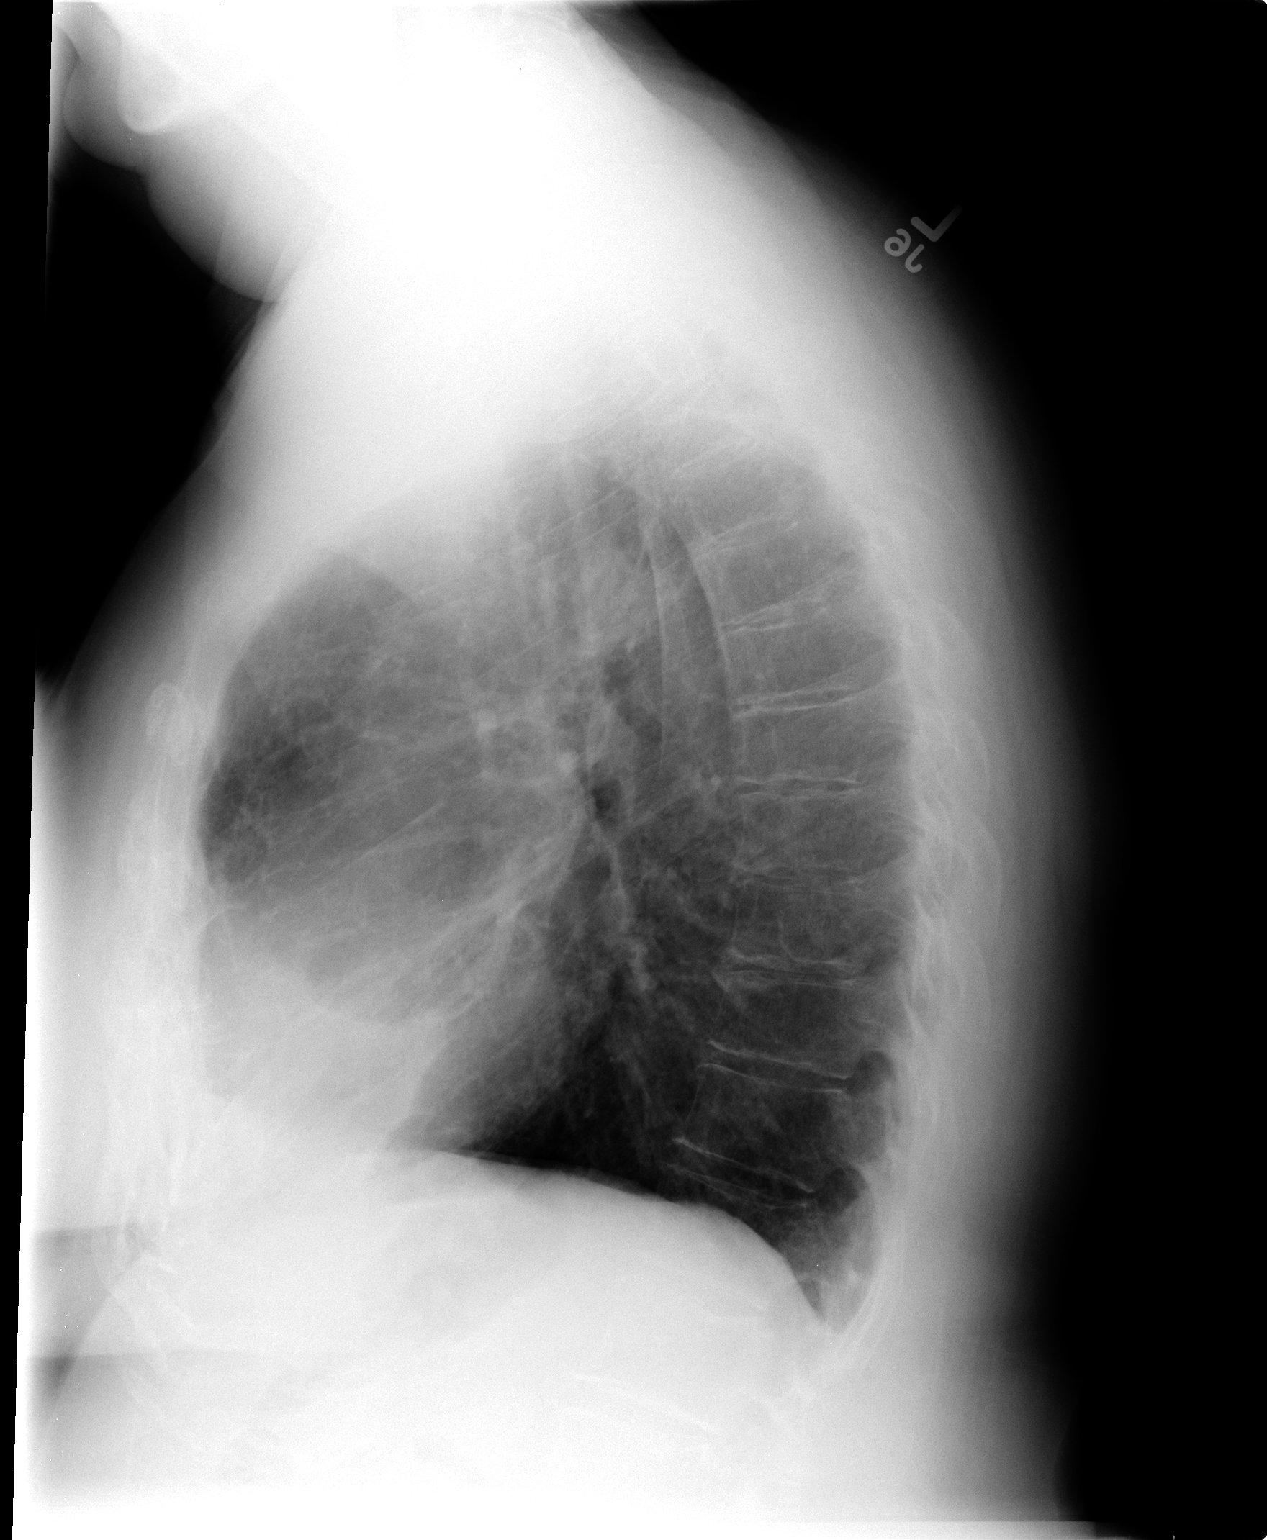

[2 of 2 positions shown; findings below may reference images not displayed]

FINDINGS: Normal heart size, mediastinal contours, and pulmonary vascularity.

Atherosclerotic calcification aorta.

Minimal atelectasis at lung bases.

No acute infiltrate, pleural effusion, the or pneumothorax.

Bones appear demineralized with evidence of prior cervical spine
fusion.
IMPRESSION: Minimal bibasilar atelectasis.

## 2014-07-01 ENCOUNTER — Ambulatory Visit (HOSPITAL_COMMUNITY): Payer: Medicare Other

## 2014-07-01 ENCOUNTER — Ambulatory Visit (HOSPITAL_COMMUNITY): Payer: Medicare Other | Admitting: Hematology & Oncology

## 2014-07-02 IMAGING — CT CT CHEST W/ CM
2 of 4 series · 15 of 36 positions shown, 18 images · IV contrast (Omnipaque 300)
Comparison: Chest CT on 02/14/2012 and PET-CT on 01/17/2012 and
contrast enhanced abdomen pelvis CT on 07/06/2009

CLINICAL DATA: Malignant non-Hodgkin's lymphoma. Chest and
abdominal pain.

EXAM:
CT CHEST, ABDOMEN, AND PELVIS WITH CONTRAST
TECHNIQUE: Multidetector CT imaging of the chest, abdomen and pelvis was
performed following the standard protocol during bolus
administration of intravenous contrast.
CONTRAST:  100mL OMNIPAQUE IOHEXOL 300 MG/ML  SOLN

[Series 2: cap with 5.0 b40f · axial · 0.75mm/px · z∈[-598,-68]mm · 12 of 118 slices shown, 15 images]
[im 6/118  mediastinal]
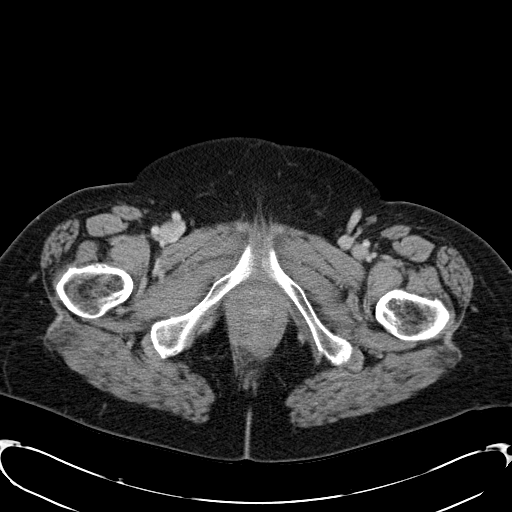
[im 6/118  lung]
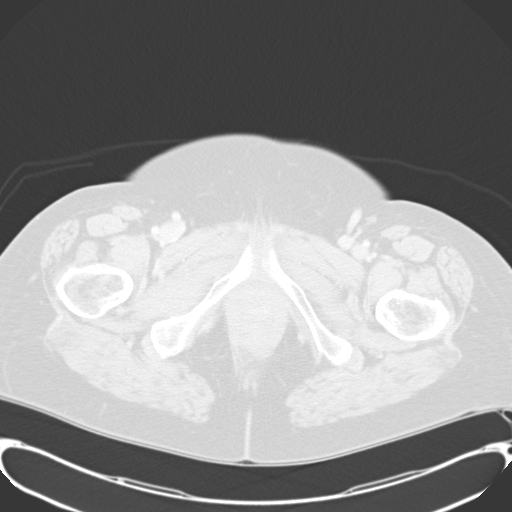
[im 18/118  lung]
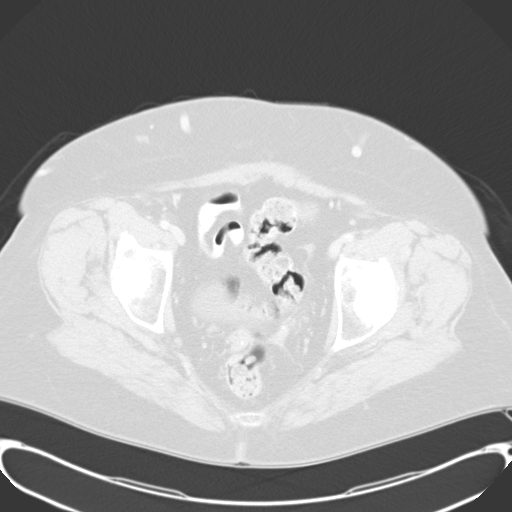
[im 24/118  lung]
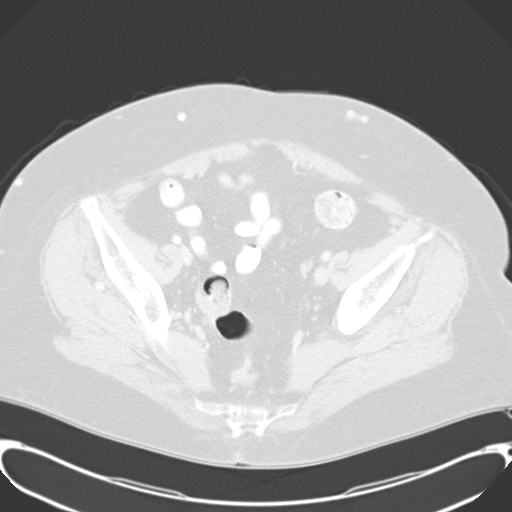
[im 36/118  lung]
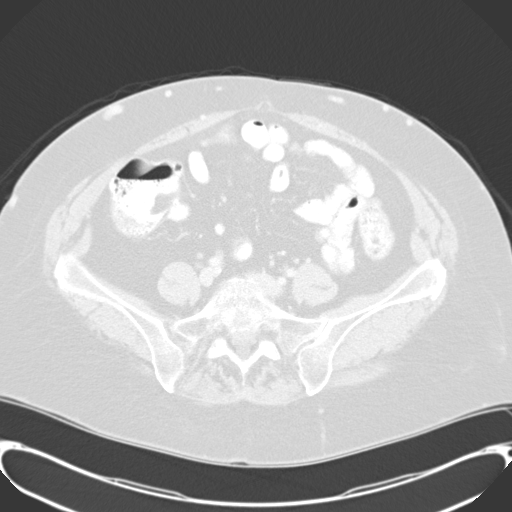
[im 47/118  mediastinal]
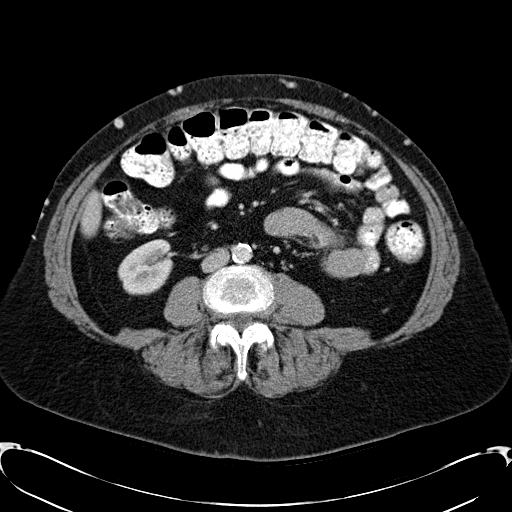
[im 47/118  lung]
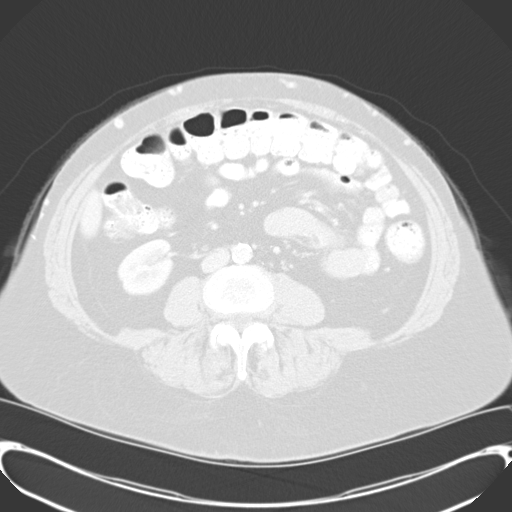
[im 53/118  lung]
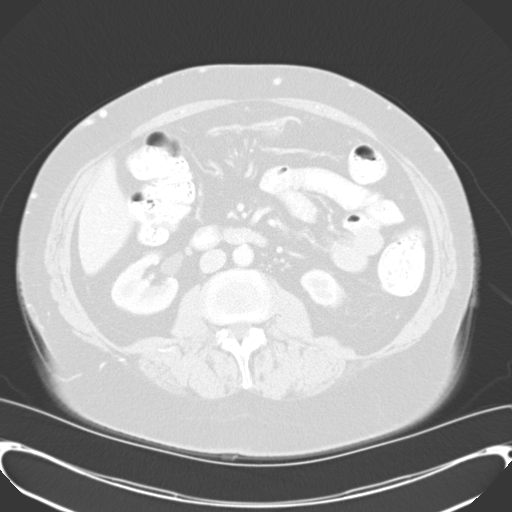
[im 65/118  lung]
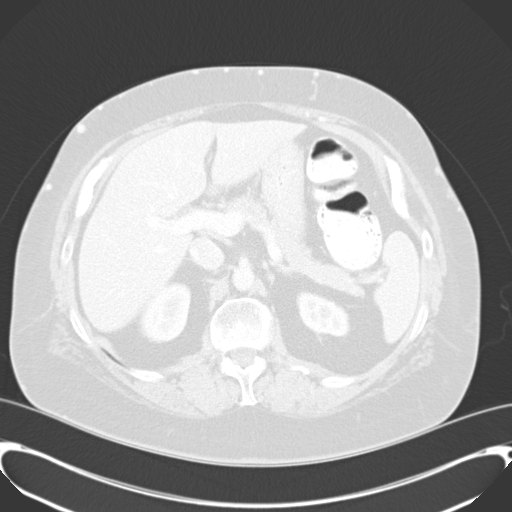
[im 71/118  lung]
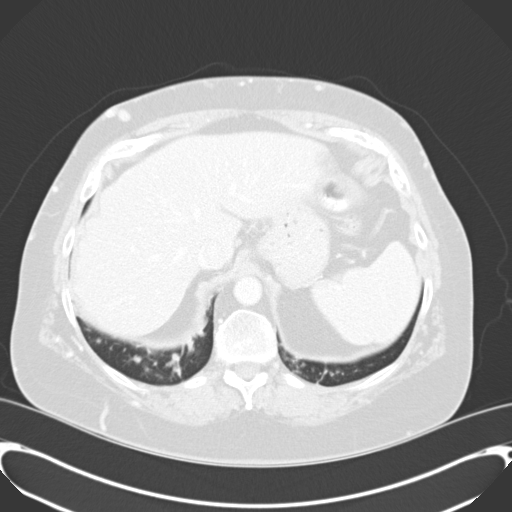
[im 82/118  mediastinal]
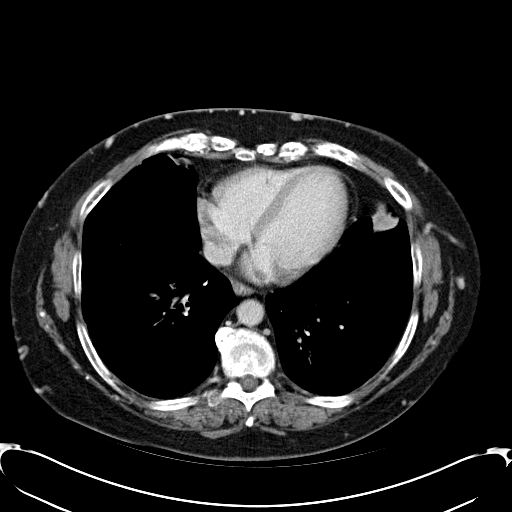
[im 82/118  lung]
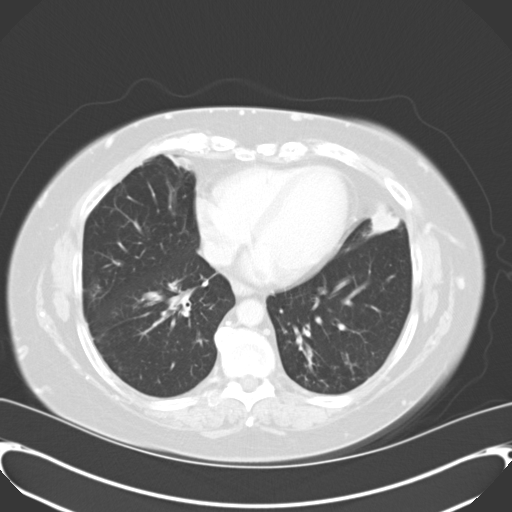
[im 94/118  lung]
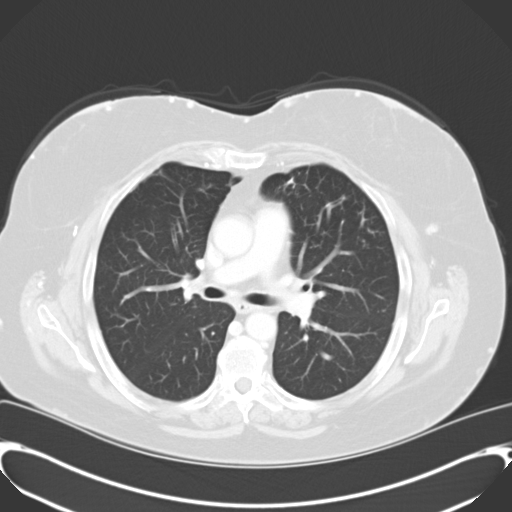
[im 100/118  lung]
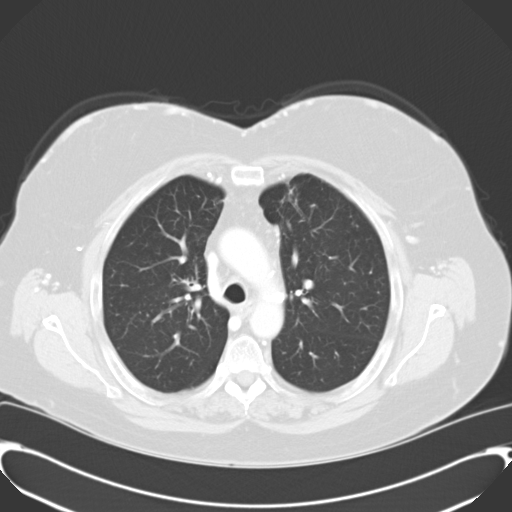
[im 112/118  lung]
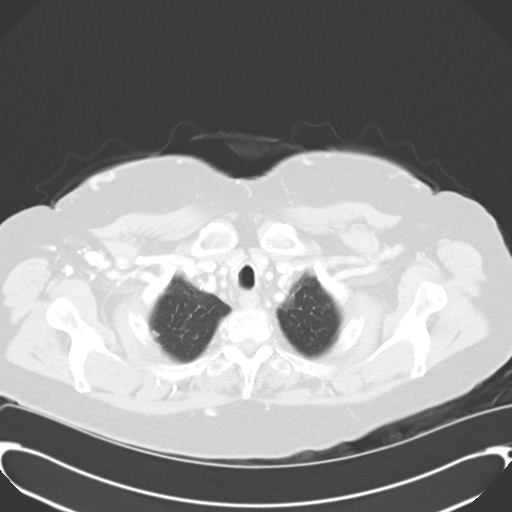

[Series 4: mpr cor post contrast (id) · coronal · 0.89mm/px · 3 of 101 slices shown]
[im 21/101  lung]
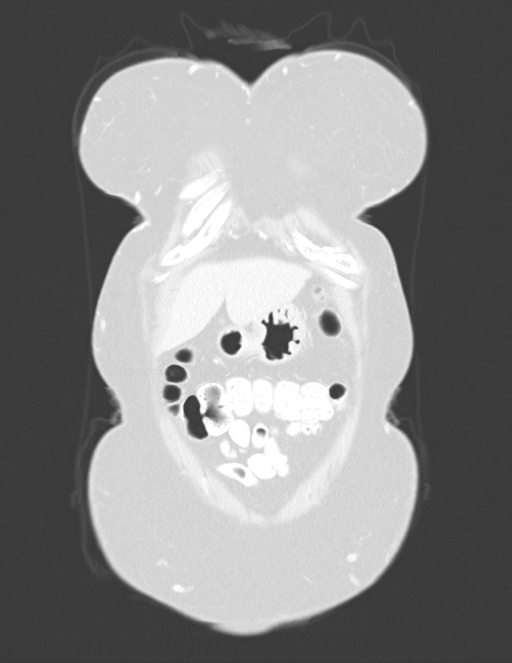
[im 41/101  lung]
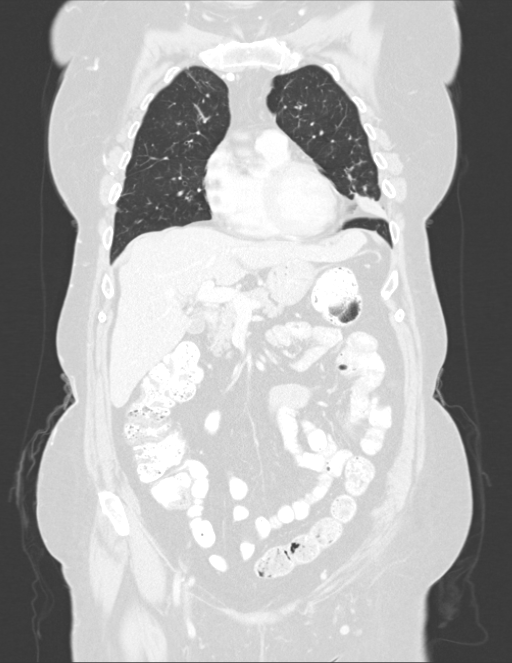
[im 61/101  lung]
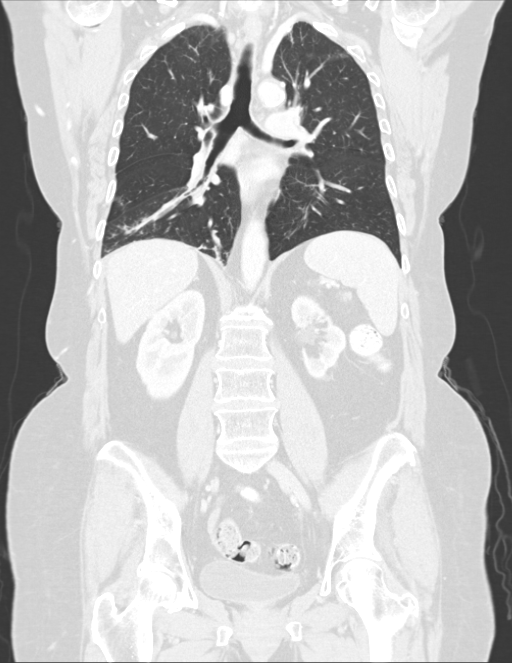

[15 of 36 positions shown; findings below may reference images not displayed]

FINDINGS: CT CHEST FINDINGS

Tiny less than 1 cm mediastinal lymph nodes remain stable. No
pathologically enlarged lymph nodes are identified within the
thorax. Chronic SVC obstruction again noted with numerous venous
collaterals again seen within the chest abdominal wall soft tissues.

No evidence of pleural or pericardial effusion. Chronic bilateral
parenchymal scarring and peripheral interstitial thickening is also
stable. No acute infiltrate or mass identified. No evidence of
central endobronchial obstruction.

CT ABDOMEN AND PELVIS FINDINGS

Two small low-attenuation lesions in the posterior right hepatic
lobe remains stable and are consistent with benign etiology. No new
or enlarging liver masses are identified. The spleen is normal in
size and appearance. The pancreas, adrenal glands and right kidney
are also normal in appearance. Chronic left renal atrophy again
noted. No evidence of renal masses or hydronephrosis.

No soft tissue masses or lymphadenopathy identified within the
abdomen or pelvis. Prior hysterectomy noted. Adnexal regions are
unremarkable in appearance.

No evidence of inflammatory process or abnormal fluid collections.
No evidence of bowel wall thickening or dilatation. No suspicious
bone lesions identified.
IMPRESSION: No evidence of recurrent lymphoma or other acute findings.

Stable chronic SVC obstruction with chest and abdominal wall
collaterals.

Stable small benign hepatic lesions and chronic left renal atrophy.

## 2014-07-04 ENCOUNTER — Ambulatory Visit (HOSPITAL_COMMUNITY): Payer: Medicare Other | Admitting: Hematology & Oncology

## 2014-07-04 ENCOUNTER — Ambulatory Visit (HOSPITAL_COMMUNITY): Payer: Medicare Other

## 2014-07-09 ENCOUNTER — Encounter (HOSPITAL_COMMUNITY): Payer: Medicare Other | Attending: Hematology and Oncology | Admitting: Hematology & Oncology

## 2014-07-09 ENCOUNTER — Ambulatory Visit (HOSPITAL_COMMUNITY): Payer: Medicare Other

## 2014-07-09 ENCOUNTER — Encounter (HOSPITAL_COMMUNITY): Payer: Self-pay | Admitting: Hematology & Oncology

## 2014-07-09 VITALS — BP 123/83 | HR 66 | Temp 98.3°F | Resp 18 | Wt 173.6 lb

## 2014-07-09 DIAGNOSIS — D801 Nonfamilial hypogammaglobulinemia: Secondary | ICD-10-CM | POA: Diagnosis not present

## 2014-07-09 DIAGNOSIS — D800 Hereditary hypogammaglobulinemia: Secondary | ICD-10-CM | POA: Insufficient documentation

## 2014-07-09 DIAGNOSIS — Z8572 Personal history of non-Hodgkin lymphomas: Secondary | ICD-10-CM | POA: Diagnosis not present

## 2014-07-09 DIAGNOSIS — C8590 Non-Hodgkin lymphoma, unspecified, unspecified site: Secondary | ICD-10-CM | POA: Insufficient documentation

## 2014-07-09 DIAGNOSIS — D869 Sarcoidosis, unspecified: Secondary | ICD-10-CM | POA: Insufficient documentation

## 2014-07-09 DIAGNOSIS — J45909 Unspecified asthma, uncomplicated: Secondary | ICD-10-CM | POA: Diagnosis not present

## 2014-07-09 MED ORDER — MUPIROCIN CALCIUM 2 % EX CREA: 1.0000 "application " | TOPICAL_CREAM | Freq: Two times a day (BID) | CUTANEOUS | Status: DC

## 2014-07-09 NOTE — Progress Notes (Signed)
[name]., MD [place] [address]    Hypogammaglobulenemia/IgG deficiency with level of 414 mg/dl  Maintenance Rituxan 09/30/2011 Hx of Diffuse large B-Cell Lymphoma, S/P R-CHOP x 4 cycles with intrathecal chemotherapy during cycles 3 and 4 with methotrexate and Solu-Cortef. Treatment was stopped due to Pseudomonas sepsis after cycles 2 and 4 which was very severe  CURRENT THERAPY:50 g IV IgG monthly for IgG deficiency.  INTERVAL HISTORY: [name] 59 y.o. female returns for IgG deficiency.  She is due for IVIG today. She has followed up with her pulmonologist. She notes that she is doing much better.  She is present alone today and complains of a bruise on her left breast noticed about a month ago.  She is requesting a breast exam. She cannot feel any abnormality but the change is concerning to her.  She has persistent asthma and a bad cough.She states it is not worse just normal for this time of year.  Since starting IVIG therapy she has had a decrease in the number of courses of antibiotics she used to require. She has no other concerns at this time  MEDICAL HISTORY: Past Medical History  Diagnosis Date  . Non Hodgkin's lymphoma   . Allergic rhinitis   . GERD (gastroesophageal reflux disease)   . Asthma   . Chronic sinusitis   . Respiratory failure   . Cavitary lung disease   . PONV (postoperative nausea and vomiting)   . Endotracheally intubated   . Myocardial infarction 2011  . Anxiety   . Chronic kidney disease   . Anemia   . Pneumonia 01/2013  . 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     is allergic to meperidine hcl and montelukast sodium.  [name] does not currently have medications on file.  SURGICAL HISTORY: Past Surgical History  Procedure Laterality Date  . Vesicovaginal fistula closure w/ tah    . Nasal sinus surgery    . Neck surgery      x 2   . Portacath placement  7/11    Removed 6/12  . Basal cell carcinoma excision  03/2011    scalp  . Port-a-cath removal    . Peripherally inserted central catheter insertion    . Picc removal    . Tracheostomy    . Abdominal hysterectomy    . Lung lobectomy      right  . Breast surgery Right   . Colonoscopy N/A 11/14/2012    Procedure: COLONOSCOPY;  Surgeon: Rogene Houston, MD;  Location: [facility];  Service: Endoscopy;  Laterality: N/A;  830    Outpatient Encounter Prescriptions as of 07/09/2014  Medication Sig Note  . acetaminophen (TYLENOL) 500 MG tablet Take 1,000 mg by mouth every 6 (six) hours as needed for pain or fever.    [name] Kitchen acyclovir (ZOVIRAX) 400 MG tablet Take 1 tablet (400 mg total) by mouth 2 (two) times daily.   [name] Kitchen albuterol (PROVENTIL) (2.5 MG/3ML) 0.083% nebulizer solution Take 2.5 mg by nebulization every 4 (four) hours as needed for wheezing or shortness of breath.   . ciprofloxacin (CIPRO) 500 MG tablet  Take 500 mg by mouth 2 (two) times daily. 07/09/2014: Received from: External Pharmacy Received Sig:   . citalopram (CELEXA) 20 MG tablet Take 20 mg by mouth every morning.     . fluticasone (FLONASE) 50 MCG/ACT nasal spray Place 2 sprays into the nose 2 (two) times daily.   . furosemide (LASIX) 20 MG tablet 20 mg as needed. 04/02/2014: Received from: External Pharmacy Received Sig:   . gabapentin (NEURONTIN) 300 MG capsule Take 1 capsule (300 mg total) by mouth 4 (four) times daily.   . mometasone-formoterol (DULERA) 200-5 MCG/ACT AERO Inhale 2 puffs into the lungs 2 (two) times daily.   [name] Kitchen  omeprazole (PRILOSEC) 20 MG capsule Take 1 capsule (20 mg total) by mouth 2 (two) times daily before a meal.   . predniSONE (DELTASONE) 10 MG tablet Take 4 for four days 3 for four days 2 for four days then 1 daily and STAY (Patient taking differently: Take 5 mg by mouth daily. )   . simvastatin (ZOCOR) 80 MG tablet Take 80 mg by mouth at bedtime.     . VENTOLIN HFA 108 (90 BASE) MCG/ACT inhaler INHALE 2 PUFFS INTO THE LUNGS EVERY 6 HOURS AS NEEDED.   [name] Kitchen vitamin C (ASCORBIC ACID) 500 MG tablet Take 500 mg by mouth 2 (two) times daily.   [name] Kitchen warfarin (COUMADIN) 10 MG tablet Take 5 mg by mouth every evening.    . zafirlukast (ACCOLATE) 20 MG tablet TAKE 1 TABLET BY MOUTH TWICE DAILY.   . mupirocin cream (BACTROBAN) 2 % Apply 1 application topically 2 (two) times daily.   . [DISCONTINUED] clindamycin (CLEOCIN) 300 MG capsule Take 300 mg by mouth 3 (three) times daily. Start 06/03/14 x 14 days   . [DISCONTINUED] levofloxacin (LEVAQUIN) 750 MG tablet Take 1 tablet (750 mg total) by mouth daily.    No facility-administered encounter medications on file as of 07/09/2014.     SOCIAL HISTORY: History   Social History  . Marital Status: Divorced    Spouse Name: N/A  . Number of Children: 3  . Years of Education: N/A   Occupational History  . [place] History Main Topics  . Smoking status: Never Smoker   . Smokeless tobacco: Never Used  . Alcohol Use: No  . Drug Use: No  . Sexual Activity: No   Other Topics Concern  . Not on file   Social History Narrative    FAMILY HISTORY: Family History  Problem Relation Age of Onset  . Emphysema Mother   . Allergies Father   . Asthma Father     as a child  . Leukemia Maternal Grandmother   . Diabetes Brother   . Stroke Mother     Review of Systems  Constitutional: Negative for fever, chills, weight loss and malaise/fatigue.  HENT: Negative for congestion, hearing loss, nosebleeds, sore throat and tinnitus.   Eyes:  Negative for blurred vision, double vision, pain and discharge.  Respiratory: Positive for cough, Negative for hemoptysis, sputum production, shortness of breath and wheezing.   Cardiovascular: Negative for chest pain, palpitations, claudication, leg swelling and PND.  Gastrointestinal: Negative for heartburn, nausea, vomiting, abdominal pain, diarrhea, constipation, blood in stool and melena.  Genitourinary: Negative for dysuria, urgency, frequency and hematuria.  Musculoskeletal: Negative for myalgias, joint pain and falls.  Skin: Negative for itching and rash.  Neurological: Negative for dizziness, tingling, tremors, sensory change, speech change, focal weakness, seizures, loss of consciousness, weakness and headaches.  Endo/Heme/Allergies: Does not bleed easily.  Bruised on left breast  Psychiatric/Behavioral: Negative for depression, suicidal ideas, memory loss and substance abuse. The patient is not nervous/anxious and does not have insomnia.   14 point review of systems was performed and is negative except as detailed under history of present illness and above   PHYSICAL EXAMINATION  ECOG PERFORMANCE STATUS: 0 - Asymptomatic  Filed Vitals:   07/09/14 0820  BP: 123/83  Pulse: 66  Temp: 98.3 F (36.8 C)  Resp: 18    Physical Exam  Constitutional: She is oriented to person, place, and time and well-developed, well-nourished, and in no distress.  HENT:  Head: Normocephalic and atraumatic.  Nose: Nose normal.  Mouth/Throat: Oropharynx is clear and moist. No oropharyngeal exudate.  Eyes: Conjunctivae and EOM are normal. Pupils are equal, round, and reactive to light. Right eye exhibits no discharge. Left eye exhibits no discharge. No scleral icterus.  Neck: Normal range of motion. Neck supple. No tracheal deviation present. No thyromegaly present.  Cardiovascular: Normal rate, regular rhythm and normal heart sounds.  Exam reveals no gallop and no friction rub.   No murmur  heard. Pulmonary/Chest: Wheezing and rhonchi throughout both lungs BREAST Exam: No palpable masses in either breast. Minor skin discoloration left breast c/w mild echymoses, appears to be healing. No palpable axillary adenopathy. No nipple retraction Abdominal: Soft. Bowel sounds are normal. She exhibits no distension and no mass. There is no tenderness. There is no rebound and no guarding.  Musculoskeletal: Normal range of motion. She exhibits no edema.  Lymphadenopathy:    She has no cervical adenopathy.  Neurological: She is alert and oriented to person, place, and time. She has normal reflexes. No cranial nerve deficit. Gait normal. Coordination normal.  Skin: Skin is warm and dry. No rash noted.  Psychiatric: Mood, memory, affect and judgment normal.  Nursing note and vitals reviewed.   LABORATORY DATA:  CBC    Component Value Date/Time   WBC 9.4 06/06/2014 1046   RBC 4.21 06/06/2014 1046   RBC 3.71* 01/10/2014 0903   HGB 11.4* 06/06/2014 1046   HCT 36.9 06/06/2014 1046   PLT 166 06/06/2014 1046   MCV 87.6 06/06/2014 1046   MCH 27.1 06/06/2014 1046   MCHC 30.9 06/06/2014 1046   RDW 15.8* 06/06/2014 1046   LYMPHSABS 0.7 06/06/2014 1046   MONOABS 0.6 06/06/2014 1046   EOSABS 0.3 06/06/2014 1046   BASOSABS 0.0 06/06/2014 1046   CMP     Component Value Date/Time   NA 139 06/06/2014 1046   K 4.0 06/06/2014 1046   CL 99* 06/06/2014 1046   CO2 30 06/06/2014 1046   GLUCOSE 114* 06/06/2014 1046   BUN 17 06/06/2014 1046   CREATININE 1.15* 06/06/2014 1046   CALCIUM 8.8* 06/06/2014 1046   PROT 6.7 06/06/2014 1046   ALBUMIN 3.5 06/06/2014 1046   AST 21 06/06/2014 1046   ALT 16 06/06/2014 1046   ALKPHOS 85 06/06/2014 1046   BILITOT 0.7 06/06/2014 1046   GFRNONAA 51* 06/06/2014 1046   GFRAA 59* 06/06/2014 1046     RADIOLOGY:  EXAM: CT CHEST WITHOUT CONTRAST  TECHNIQUE: Multidetector CT imaging of the chest was performed following the standard protocol without IV  contrast.  COMPARISON: 06/24/2013 PET scan  FINDINGS: Stable mild bilateral lower lobe bronchitic change. In the medial right lower lobe there is mild reticular nodular infiltrate, new from prior study. Mild linear densities lateral right upper lobe image number 26, likely representing scarring as it  is not significantly changed.  Subsegmental atelectasis inferior lateral lingula similar to prior study. The previously described rounded opacity in the left lower lobe that measured 8 x 8 mm is not seen current study. Mild right lung base scarring and atelectasis is very similar to the prior study.  Thoracic inlet and thyroid are normal. Small mediastinal lymph nodes, none of which are pathologic by size, not significantly different from prior study. No pleural or pericardial effusion. No acute musculoskeletal findings.  IMPRESSION: 1. Interval resolution of 8 mm pulmonary nodule left lower lobe of 2. Interval development of mild reticular nodular infiltrate medially in the right lower lobe seen on images 37 through 42. This could represent infectious or inflammatory etiology, although malignancy including lymphangitic tumor spread is not excluded.   Electronically Signed  By: Skipper Cliche M.D.  On: 01/16/2014 10:53   ASSESSMENT and THERAPY PLAN:   Hypogammaglobulinemia Asthma  She will continue with IVIG.She is due again this upcoming Monday.  We discussed continuing her IVIG for now. I will see her back in 3 months. She continues to follow with Dr. Joya [name] and has just seen him in May.  Diffuse large B-cell lymphoma  She remains without evidence of obvious recurrence. We will continue ongoing observation.  I advised her that if her breast continues to concern her we can move up her mammography. I assured her that her exam was benign, but to notify me for any additional concerns or problems.  She has a skin tear on her arm, I have called her in  [place].  All questions were answered. The patient knows to call the clinic with any problems, questions or concerns. We can certainly see the patient much sooner if necessary.   This document serves as a record of services personally performed by Ancil Linsey, MD. It was created on her behalf by Janace Hoard, a trained medical scribe. The creation of this record is based on the scribe's personal observations and the provider's statements to them. This document has been checked and approved by the attending provider.  I have reviewed the above documentation for accuracy and completeness, and I agree with the above.  This note was signed electronically.  Kelby Fam. Whitney Muse, MD

## 2014-07-09 NOTE — Patient Instructions (Signed)
[place] at [facility] Discharge Instructions  RECOMMENDATIONS MADE BY THE CONSULTANT AND ANY TEST RESULTS WILL BE SENT TO YOUR REFERRING PHYSICIAN.  Exam and discussion by Dr. Whitney Muse. Will continue with current therapy. Call with any concerns.  Follow-up in 5 weeks.  Thank you for choosing [place] at [facility] to provide your oncology and hematology care.  To afford each patient quality time with our provider, please arrive at least 15 minutes before your scheduled appointment time.    You need to re-schedule your appointment should you arrive 10 or more minutes late.  We strive to give you quality time with our providers, and arriving late affects you and other patients whose appointments are after yours.  Also, if you no show three or more times for appointments you may be dismissed from the clinic at the providers discretion.     Again, thank you for choosing [facility].  Our hope is that these requests will decrease the amount of time that you wait before being seen by our physicians.       _____________________________________________________________  Should you have questions after your visit to [facility], please contact our office at [phone] between the hours of 8:30 a.m. and 4:30 p.m.  Voicemails left after 4:30 p.m. will not be returned until the following business day.  For prescription refill requests, have your pharmacy contact our office.

## 2014-07-14 ENCOUNTER — Encounter (HOSPITAL_BASED_OUTPATIENT_CLINIC_OR_DEPARTMENT_OTHER): Payer: Medicare Other

## 2014-07-14 VITALS — BP 122/74 | HR 62 | Temp 98.0°F | Resp 18

## 2014-07-14 DIAGNOSIS — D801 Nonfamilial hypogammaglobulinemia: Secondary | ICD-10-CM

## 2014-07-14 MED ORDER — ACETAMINOPHEN 325 MG PO TABS: 650.0000 mg | ORAL_TABLET | Freq: Four times a day (QID) | ORAL | Status: DC | PRN

## 2014-07-14 MED ORDER — SODIUM CHLORIDE 0.9 % IJ SOLN
10.0000 mL | INTRAMUSCULAR | Status: DC | PRN
  Administered 2014-07-14: 10 mL
  Filled 2014-07-14: qty 10

## 2014-07-14 MED ORDER — DIPHENHYDRAMINE HCL 25 MG PO CAPS: 25.0000 mg | ORAL_CAPSULE | Freq: Once | ORAL | Status: DC

## 2014-07-14 MED ORDER — SODIUM CHLORIDE 0.9 % IV SOLN
Freq: Once | INTRAVENOUS | Status: AC
  Administered 2014-07-14: 09:00:00 via INTRAVENOUS

## 2014-07-14 MED ORDER — IMMUNE GLOBULIN (HUMAN) 10 GM/100ML IV SOLN
50.0000 g | Freq: Once | INTRAVENOUS | Status: AC
  Administered 2014-07-14: 50 g via INTRAVENOUS
  Filled 2014-07-14: qty 100

## 2014-07-14 NOTE — Progress Notes (Signed)
Tolerated infusion well. 

## 2014-07-14 NOTE — Patient Instructions (Signed)
[place] at [facility] Discharge Instructions  RECOMMENDATIONS MADE BY THE CONSULTANT AND ANY TEST RESULTS WILL BE SENT TO YOUR REFERRING PHYSICIAN.  IVIG given today as ordered.  Return as scheduled.  Thank you for choosing [place] at [facility] to provide your oncology and hematology care.  To afford each patient quality time with our provider, please arrive at least 15 minutes before your scheduled appointment time.    You need to re-schedule your appointment should you arrive 10 or more minutes late.  We strive to give you quality time with our providers, and arriving late affects you and other patients whose appointments are after yours.  Also, if you no show three or more times for appointments you may be dismissed from the clinic at the providers discretion.     Again, thank you for choosing [facility].  Our hope is that these requests will decrease the amount of time that you wait before being seen by our physicians.       _____________________________________________________________  Should you have questions after your visit to [facility], please contact our office at [phone] between the hours of 8:30 a.m. and 4:30 p.m.  Voicemails left after 4:30 p.m. will not be returned until the following business day.  For prescription refill requests, have your pharmacy contact our office.

## 2014-08-04 ENCOUNTER — Ambulatory Visit (HOSPITAL_COMMUNITY): Payer: Medicare Other | Admitting: Hematology & Oncology

## 2014-08-04 ENCOUNTER — Ambulatory Visit (HOSPITAL_COMMUNITY): Payer: Medicare Other

## 2014-08-11 ENCOUNTER — Ambulatory Visit (HOSPITAL_COMMUNITY): Payer: Medicare Other | Admitting: Hematology & Oncology

## 2014-08-11 ENCOUNTER — Ambulatory Visit (HOSPITAL_COMMUNITY): Payer: Medicare Other

## 2014-08-12 ENCOUNTER — Encounter (HOSPITAL_COMMUNITY): Payer: Medicare Other | Attending: Hematology and Oncology

## 2014-08-12 ENCOUNTER — Encounter (HOSPITAL_BASED_OUTPATIENT_CLINIC_OR_DEPARTMENT_OTHER): Payer: Medicare Other

## 2014-08-12 ENCOUNTER — Encounter (HOSPITAL_COMMUNITY): Payer: Self-pay | Admitting: Hematology & Oncology

## 2014-08-12 ENCOUNTER — Encounter (HOSPITAL_BASED_OUTPATIENT_CLINIC_OR_DEPARTMENT_OTHER): Payer: Medicare Other | Admitting: Hematology & Oncology

## 2014-08-12 VITALS — BP 142/69 | HR 69 | Temp 98.2°F | Resp 20 | Wt 174.4 lb

## 2014-08-12 VITALS — BP 154/77 | HR 63 | Temp 98.1°F | Resp 18

## 2014-08-12 DIAGNOSIS — T45515A Adverse effect of anticoagulants, initial encounter: Secondary | ICD-10-CM

## 2014-08-12 DIAGNOSIS — D869 Sarcoidosis, unspecified: Secondary | ICD-10-CM | POA: Insufficient documentation

## 2014-08-12 DIAGNOSIS — D6832 Hemorrhagic disorder due to extrinsic circulating anticoagulants: Secondary | ICD-10-CM

## 2014-08-12 DIAGNOSIS — C8590 Non-Hodgkin lymphoma, unspecified, unspecified site: Secondary | ICD-10-CM

## 2014-08-12 DIAGNOSIS — J45909 Unspecified asthma, uncomplicated: Secondary | ICD-10-CM | POA: Diagnosis not present

## 2014-08-12 DIAGNOSIS — D801 Nonfamilial hypogammaglobulinemia: Secondary | ICD-10-CM | POA: Diagnosis not present

## 2014-08-12 DIAGNOSIS — L97829 Non-pressure chronic ulcer of other part of left lower leg with unspecified severity: Secondary | ICD-10-CM

## 2014-08-12 DIAGNOSIS — D800 Hereditary hypogammaglobulinemia: Secondary | ICD-10-CM | POA: Insufficient documentation

## 2014-08-12 DIAGNOSIS — Z8572 Personal history of non-Hodgkin lymphomas: Secondary | ICD-10-CM

## 2014-08-12 LAB — CBC WITH DIFFERENTIAL/PLATELET
Basophils Absolute: 0 10*3/uL (ref 0.0–0.1)
Basophils Relative: 0 % (ref 0–1)
Eosinophils Absolute: 0.1 10*3/uL (ref 0.0–0.7)
Eosinophils Relative: 2 % (ref 0–5)
HCT: 34.3 % — ABNORMAL LOW (ref 36.0–46.0)
HEMOGLOBIN: 10.3 g/dL — AB (ref 12.0–15.0)
Lymphocytes Relative: 18 % (ref 12–46)
Lymphs Abs: 1.3 10*3/uL (ref 0.7–4.0)
MCH: 26.8 pg (ref 26.0–34.0)
MCHC: 30 g/dL (ref 30.0–36.0)
MCV: 89.3 fL (ref 78.0–100.0)
MONOS PCT: 8 % (ref 3–12)
Monocytes Absolute: 0.6 10*3/uL (ref 0.1–1.0)
NEUTROS ABS: 4.9 10*3/uL (ref 1.7–7.7)
Neutrophils Relative %: 72 % (ref 43–77)
PLATELETS: 184 10*3/uL (ref 150–400)
RBC: 3.84 MIL/uL — AB (ref 3.87–5.11)
RDW: 15.4 % (ref 11.5–15.5)
WBC: 6.8 10*3/uL (ref 4.0–10.5)

## 2014-08-12 LAB — PROTIME-INR
INR: 2.61 — AB (ref 0.00–1.49)
PROTHROMBIN TIME: 27.6 s — AB (ref 11.6–15.2)

## 2014-08-12 LAB — COMPREHENSIVE METABOLIC PANEL
ALT: 12 U/L — ABNORMAL LOW (ref 14–54)
AST: 17 U/L (ref 15–41)
Albumin: 3.5 g/dL (ref 3.5–5.0)
Alkaline Phosphatase: 71 U/L (ref 38–126)
Anion gap: 10 (ref 5–15)
BILIRUBIN TOTAL: 0.5 mg/dL (ref 0.3–1.2)
BUN: 16 mg/dL (ref 6–20)
CO2: 29 mmol/L (ref 22–32)
Calcium: 8.7 mg/dL — ABNORMAL LOW (ref 8.9–10.3)
Chloride: 102 mmol/L (ref 101–111)
Creatinine, Ser: 1.15 mg/dL — ABNORMAL HIGH (ref 0.44–1.00)
GFR calc Af Amer: 59 mL/min — ABNORMAL LOW (ref >60)
GFR calc non Af Amer: 51 mL/min — ABNORMAL LOW (ref >60)
Glucose, Bld: 91 mg/dL (ref 65–99)
Potassium: 3.7 mmol/L (ref 3.5–5.1)
Sodium: 141 mmol/L (ref 135–145)
Total Protein: 6.5 g/dL (ref 6.5–8.1)

## 2014-08-12 MED ORDER — IMMUNE GLOBULIN (HUMAN) 20 GM/200ML IV SOLN
50.0000 g | Freq: Once | INTRAVENOUS | Status: AC
  Administered 2014-08-12: 50 g via INTRAVENOUS
  Filled 2014-08-12: qty 100

## 2014-08-12 MED ORDER — SODIUM CHLORIDE 0.9 % IV SOLN
Freq: Once | INTRAVENOUS | Status: AC
Start: 2014-08-12 — End: 2014-08-12
  Administered 2014-08-12: 10:00:00 via INTRAVENOUS

## 2014-08-12 MED ORDER — IMMUNE GLOBULIN (HUMAN) 10 GM/100ML IV SOLN: 50.0000 g | Freq: Once | INTRAVENOUS | Status: DC

## 2014-08-12 MED ORDER — SODIUM CHLORIDE 0.9 % IJ SOLN: 10.0000 mL | INTRAMUSCULAR | Status: DC | PRN

## 2014-08-12 NOTE — Progress Notes (Signed)
LABS DRAWN

## 2014-08-12 NOTE — Patient Instructions (Addendum)
[place] at [facility] Discharge Instructions  RECOMMENDATIONS MADE BY THE CONSULTANT AND ANY TEST RESULTS WILL BE SENT TO YOUR REFERRING PHYSICIAN.  Exam completed by Dr Whitney Muse today IVIG today Return in 3 months to see the doctor IVIG in 4 weeks Please call the clinic if you have any questions or concerns  Thank you for choosing [place] at [facility] to provide your oncology and hematology care.  To afford each patient quality time with our provider, please arrive at least 15 minutes before your scheduled appointment time.    You need to re-schedule your appointment should you arrive 10 or more minutes late.  We strive to give you quality time with our providers, and arriving late affects you and other patients whose appointments are after yours.  Also, if you no show three or more times for appointments you may be dismissed from the clinic at the providers discretion.     Again, thank you for choosing [facility].  Our hope is that these requests will decrease the amount of time that you wait before being seen by our physicians.       _____________________________________________________________  Should you have questions after your visit to [facility], please contact our office at [phone] between the hours of 8:30 a.m. and 4:30 p.m.  Voicemails left after 4:30 p.m. will not be returned until the following business day.  For prescription refill requests, have your pharmacy contact our office.

## 2014-08-12 NOTE — Progress Notes (Signed)
Tolerated  IVIG infusion well.

## 2014-08-12 NOTE — Progress Notes (Signed)
[name], MD [place] [address]    Hypogammaglobulenemia/IgG deficiency with level of 414 mg/dl Maintenance Rituxan 09/30/2011 Hx of Diffuse large B-Cell Lymphoma, S/P R-CHOP x 4 cycles with intrathecal chemotherapy during cycles 3 and 4 with methotrexate and Solu-Cortef. Treatment was stopped due to Pseudomonas sepsis after cycles 2 and 4 which was very severe  CURRENT THERAPY:50 g IV IgG monthly for IgG deficiency.  INTERVAL HISTORY: [name] 59 y.o. female returns for IgG deficiency.  She is due for IVIG today.  She is here alone today and feeling okay. She reports the place on her breast is okay and "gone". Her energy is the same, as well as her breathing.  She continues to have problems with healing of her LLE. She saw Dr.Jenkins in the past for her RLE. She notes that sometimes it feels like a stabbing pain in her leg. It is sore and does not seem to be healing. She mentions that Dr. Joya Gaskins is moving out of practice and is unsure who she'll be seeing now. She recently purchased a bike and has begun riding it around for exercise with her family. She denies worsening SOB. Cough is persistent.  MEDICAL HISTORY: Past Medical History  Diagnosis Date  . Non Hodgkin's lymphoma   . Allergic rhinitis   . GERD (gastroesophageal reflux disease)   . Asthma   . Chronic sinusitis   . Respiratory failure   . Cavitary lung disease   . PONV (postoperative nausea and vomiting)   . Endotracheally intubated   . Myocardial infarction 2011  . Anxiety   . Chronic kidney disease   . Anemia   . Pneumonia 01/2013  . 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     is allergic to meperidine hcl and montelukast sodium.  Ms. Bolds had no medications administered during this visit.  SURGICAL HISTORY: Past Surgical History  Procedure Laterality Date  . Vesicovaginal fistula closure w/ tah    . Nasal sinus surgery    . Neck surgery      x 2   . Portacath placement  7/11    Removed 6/12  . Basal cell carcinoma excision  03/2011    scalp  . Port-a-cath removal    . Peripherally inserted central catheter insertion    . Picc removal    . Tracheostomy    . Abdominal hysterectomy    . Lung lobectomy      right  . Breast surgery Right   . Colonoscopy N/A 11/14/2012    Procedure: COLONOSCOPY;  Surgeon: Rogene Houston, MD;  Location: [facility];  Service: Endoscopy;  Laterality: N/A;  830    Outpatient Encounter Prescriptions as of 08/12/2014  Medication Sig Note  . acetaminophen (TYLENOL) 500 MG tablet Take 1,000 mg by mouth every 6 (six) hours as needed for pain or fever.    [name] Kitchen acyclovir (ZOVIRAX) 400 MG tablet Take 1 tablet (400 mg total) by mouth 2 (two) times daily.   [name] Kitchen albuterol (PROVENTIL) (2.5 MG/3ML) 0.083% nebulizer solution Take 2.5 mg by nebulization every 4 (four) hours as needed for wheezing or shortness of  breath.   . citalopram (CELEXA) 20 MG tablet Take 20 mg by mouth every morning.     . fluticasone (FLONASE) 50 MCG/ACT nasal spray Place 2 sprays into the nose 2 (two) times daily.   . furosemide (LASIX) 20 MG tablet 20 mg as needed. 04/02/2014: Received from: External Pharmacy Received Sig:   . gabapentin (NEURONTIN) 300 MG capsule Take 1 capsule (300 mg total) by mouth 4 (four) times daily.   . mometasone-formoterol (DULERA) 200-5 MCG/ACT AERO Inhale 2 puffs into the lungs 2 (two) times daily.   . mupirocin cream (BACTROBAN) 2 % Apply 1 application topically 2 (two) times daily.   [name] Kitchen  omeprazole (PRILOSEC) 20 MG capsule Take 1 capsule (20 mg total) by mouth 2 (two) times daily before a meal.   . predniSONE (DELTASONE) 10 MG tablet Take 4 for four days 3 for four days 2 for four days then 1 daily and STAY (Patient taking differently: Take 5 mg by mouth daily. )   . simvastatin (ZOCOR) 80 MG tablet Take 80 mg by mouth at bedtime.     . VENTOLIN HFA 108 (90 BASE) MCG/ACT inhaler INHALE 2 PUFFS INTO THE LUNGS EVERY 6 HOURS AS NEEDED.   [name] Kitchen vitamin C (ASCORBIC ACID) 500 MG tablet Take 500 mg by mouth 2 (two) times daily.   [name] Kitchen warfarin (COUMADIN) 10 MG tablet Take 5 mg by mouth every evening.    . zafirlukast (ACCOLATE) 20 MG tablet TAKE 1 TABLET BY MOUTH TWICE DAILY.   . [DISCONTINUED] ciprofloxacin (CIPRO) 500 MG tablet Take 500 mg by mouth 2 (two) times daily. 07/09/2014: Received from: External Pharmacy Received Sig:    No facility-administered encounter medications on file as of 08/12/2014.     SOCIAL HISTORY: History   Social History  . Marital Status: Divorced    Spouse Name: N/A  . Number of Children: 3  . Years of Education: N/A   Occupational History  . [place] History Main Topics  . Smoking status: Never Smoker   . Smokeless tobacco: Never Used  . Alcohol Use: No  . Drug Use: No  . Sexual Activity: No   Other Topics Concern  . Not on file   Social History Narrative    FAMILY HISTORY: Family History  Problem Relation Age of Onset  . Emphysema Mother   . Allergies Father   . Asthma Father     as a child  . Leukemia Maternal Grandmother   . Diabetes Brother   . Stroke Mother     Review of Systems  Constitutional: Negative for fever, chills, weight loss and malaise/fatigue.  HENT: Negative for congestion, hearing loss, nosebleeds, sore throat and tinnitus.   Eyes: Negative for blurred vision, double vision, pain and discharge.  Respiratory: Positive for cough, Negative for hemoptysis, sputum production, shortness of breath  and wheezing.   Cardiovascular: Negative for chest pain, palpitations, claudication, leg swelling and PND.  Gastrointestinal: Negative for heartburn, nausea, vomiting, abdominal pain, diarrhea, constipation, blood in stool and melena.  Genitourinary: Negative for dysuria, urgency, frequency and hematuria.  Musculoskeletal: Negative for myalgias, joint pain and falls.  Skin: Negative for itching and rash.     Leg sore. Neurological: Negative for dizziness, tingling, tremors, sensory change, speech change, focal weakness, seizures, loss of consciousness, weakness and headaches.  Endo/Heme/Allergies: Does not bleed easily.  Psychiatric/Behavioral: Negative for depression, suicidal ideas, memory loss and substance abuse. The patient is not nervous/anxious and does not have  insomnia.   14 point review of systems was performed and is negative except as detailed under history of present illness and above   PHYSICAL EXAMINATION  ECOG PERFORMANCE STATUS: 0 - Asymptomatic  Filed Vitals:   08/12/14 0800  BP: 142/69  Pulse: 69  Temp: 98.2 F (36.8 C)  Resp: 20    Physical Exam  Constitutional: She is oriented to person, place, and time and well-developed, well-nourished, and in no distress.  HENT:  Head: Normocephalic and atraumatic.  Nose: Nose normal.  Mouth/Throat: Oropharynx is clear and moist. No oropharyngeal exudate.  Eyes: Conjunctivae and EOM are normal. Pupils are equal, round, and reactive to light. Right eye exhibits no discharge. Left eye exhibits no discharge. No scleral icterus.  Neck: Normal range of motion. Neck supple. No tracheal deviation present. No thyromegaly present.  Cardiovascular: Normal rate, regular rhythm and normal heart sounds.  Exam reveals no gallop and no friction rub.   No murmur heard. Pulmonary/Chest: Negative Abdominal: Soft. Bowel sounds are normal. She exhibits no distension and no mass. There is no tenderness. There is no rebound and no guarding.    Musculoskeletal: Normal range of motion. She exhibits no edema.  Lymphadenopathy:    She has no cervical adenopathy.  Neurological: She is alert and oriented to person, place, and time. She has normal reflexes. No cranial nerve deficit. Gait normal. Coordination normal.  Skin: Skin is warm and dry. No rash noted.  Psychiatric: Mood, memory, affect and judgment normal.  Nursing note and vitals reviewed.   LABORATORY DATA:  CBC    Component Value Date/Time   WBC 6.8 08/12/2014 0856   RBC 3.84* 08/12/2014 0856   RBC 3.71* 01/10/2014 0903   HGB 10.3* 08/12/2014 0856   HCT 34.3* 08/12/2014 0856   PLT 184 08/12/2014 0856   MCV 89.3 08/12/2014 0856   MCH 26.8 08/12/2014 0856   MCHC 30.0 08/12/2014 0856   RDW 15.4 08/12/2014 0856   LYMPHSABS 1.3 08/12/2014 0856   MONOABS 0.6 08/12/2014 0856   EOSABS 0.1 08/12/2014 0856   BASOSABS 0.0 08/12/2014 0856   CMP     Component Value Date/Time   NA 141 08/12/2014 0856   K 3.7 08/12/2014 0856   CL 102 08/12/2014 0856   CO2 29 08/12/2014 0856   GLUCOSE 91 08/12/2014 0856   BUN 16 08/12/2014 0856   CREATININE 1.15* 08/12/2014 0856   CALCIUM 8.7* 08/12/2014 0856   PROT 6.5 08/12/2014 0856   ALBUMIN 3.5 08/12/2014 0856   AST 17 08/12/2014 0856   ALT 12* 08/12/2014 0856   ALKPHOS 71 08/12/2014 0856   BILITOT 0.5 08/12/2014 0856   GFRNONAA 51* 08/12/2014 0856   GFRAA 59* 08/12/2014 0856     RADIOLOGY:  EXAM: CT CHEST WITHOUT CONTRAST  TECHNIQUE: Multidetector CT imaging of the chest was performed following the standard protocol without IV contrast.  COMPARISON: 06/24/2013 PET scan  FINDINGS: Stable mild bilateral lower lobe bronchitic change. In the medial right lower lobe there is mild reticular nodular infiltrate, new from prior study. Mild linear densities lateral right upper lobe image number 26, likely representing scarring as it is not significantly changed.  Subsegmental atelectasis inferior lateral lingula  similar to prior study. The previously described rounded opacity in the left lower lobe that measured 8 x 8 mm is not seen current study. Mild right lung base scarring and atelectasis is very similar to the prior study.  Thoracic inlet and thyroid are normal. Small mediastinal lymph nodes, none of  which are pathologic by size, not significantly different from prior study. No pleural or pericardial effusion. No acute musculoskeletal findings.  IMPRESSION: 1. Interval resolution of 8 mm pulmonary nodule left lower lobe of 2. Interval development of mild reticular nodular infiltrate medially in the right lower lobe seen on images 37 through 42. This could represent infectious or inflammatory etiology, although malignancy including lymphangitic tumor spread is not excluded.   Electronically Signed  By: Skipper Cliche M.D.  On: 01/16/2014 10:53   ASSESSMENT and THERAPY PLAN:   Hypogammaglobulinemia Asthma  She will continue with IVIG.  I will see her back in 3 months. She continues to follow with pulmonary. I have recommended she discuss with Dr. Joya Gaskins his recommendations on who to see after his departure.  Diffuse large B-cell lymphoma  She remains without evidence of obvious recurrence. We will continue ongoing observation.  I have referred her to Dr. Arnoldo Morale for her nonhealing leg wound. We will see her again in 3 months for follow up.  All questions were answered. The patient knows to call the clinic with any problems, questions or concerns. We can certainly see the patient much sooner if necessary.   This document serves as a record of services personally performed by Ancil Linsey, MD. It was created on her behalf by Arlyce Harman, a trained medical scribe. The creation of this record is based on the scribe's personal observations and the provider's statements to them. This document has been checked and approved by the attending provider.  I have reviewed the  above documentation for accuracy and completeness, and I agree with the above.  This note was signed electronically.  Kelby Fam. Whitney Muse, MD

## 2014-08-13 LAB — IGG, IGA, IGM
IGM, SERUM: 94 mg/dL (ref 26–217)
IgA: 44 mg/dL — ABNORMAL LOW (ref 87–352)
IgG (Immunoglobin G), Serum: 858 mg/dL (ref 700–1600)

## 2014-08-15 ENCOUNTER — Other Ambulatory Visit (HOSPITAL_COMMUNITY): Payer: Self-pay | Admitting: Hematology & Oncology

## 2014-09-02 ENCOUNTER — Telehealth: Payer: Self-pay | Admitting: Critical Care Medicine

## 2014-09-02 NOTE — Telephone Encounter (Signed)
lmtcb x1 for pt. 

## 2014-09-02 NOTE — Telephone Encounter (Signed)
Pt returned call. Pt requesting Dr. Bettina Gavia thoughts on which physician to switch to after PW is no longer seeing pts.   Dr. Joya Gaskins please advise. Thanks.

## 2014-09-03 ENCOUNTER — Ambulatory Visit (HOSPITAL_BASED_OUTPATIENT_CLINIC_OR_DEPARTMENT_OTHER): Payer: Medicare Other

## 2014-09-03 NOTE — Telephone Encounter (Signed)
Pt called back. appt scheduled to see JN. Nothing further needed

## 2014-09-03 NOTE — Telephone Encounter (Signed)
Suggest [name]

## 2014-09-03 NOTE — Telephone Encounter (Signed)
LMTCB

## 2014-09-12 ENCOUNTER — Encounter (HOSPITAL_COMMUNITY): Payer: Medicare Other | Attending: Hematology and Oncology

## 2014-09-12 VITALS — BP 116/65 | HR 61 | Temp 97.9°F | Resp 18

## 2014-09-12 DIAGNOSIS — D801 Nonfamilial hypogammaglobulinemia: Secondary | ICD-10-CM | POA: Insufficient documentation

## 2014-09-12 DIAGNOSIS — D800 Hereditary hypogammaglobulinemia: Secondary | ICD-10-CM | POA: Insufficient documentation

## 2014-09-12 DIAGNOSIS — D869 Sarcoidosis, unspecified: Secondary | ICD-10-CM | POA: Insufficient documentation

## 2014-09-12 DIAGNOSIS — C8590 Non-Hodgkin lymphoma, unspecified, unspecified site: Secondary | ICD-10-CM | POA: Insufficient documentation

## 2014-09-12 DIAGNOSIS — D839 Common variable immunodeficiency, unspecified: Secondary | ICD-10-CM

## 2014-09-12 MED ORDER — SODIUM CHLORIDE 0.9 % IJ SOLN: 10.0000 mL | INTRAMUSCULAR | Status: DC | PRN

## 2014-09-12 MED ORDER — SODIUM CHLORIDE 0.9 % IV SOLN
Freq: Once | INTRAVENOUS | Status: AC
  Administered 2014-09-12: 10:00:00 via INTRAVENOUS

## 2014-09-12 MED ORDER — IMMUNE GLOBULIN (HUMAN) 10 GM/100ML IV SOLN
50.0000 g | Freq: Once | INTRAVENOUS | Status: AC
  Administered 2014-09-12: 50 g via INTRAVENOUS
  Filled 2014-09-12: qty 100

## 2014-09-12 MED ORDER — ACETAMINOPHEN 325 MG PO TABS
650.0000 mg | ORAL_TABLET | Freq: Four times a day (QID) | ORAL | Status: DC | PRN
  Administered 2014-09-12: 650 mg via ORAL
  Filled 2014-09-12: qty 2

## 2014-09-12 MED ORDER — DIPHENHYDRAMINE HCL 25 MG PO TABS
25.0000 mg | ORAL_TABLET | Freq: Once | ORAL | Status: AC
  Administered 2014-09-12: 25 mg via ORAL
  Filled 2014-09-12: qty 1

## 2014-09-12 NOTE — Progress Notes (Signed)
Patient tolerated infusion well.  VSS post infusion.   

## 2014-09-12 NOTE — Patient Instructions (Signed)
..  [facility] Discharge Instructions for Patients Receiving Chemotherapy  Today you received the following agent:  IVIG.    If you develop nausea and vomiting, or diarrhea that is not controlled by your medication, call the clinic.  The clinic phone number is [phone]. Office hours are Monday-Friday 8:30am-5:00pm.  BELOW ARE SYMPTOMS THAT SHOULD BE REPORTED IMMEDIATELY:  *FEVER GREATER THAN 101.0 F  *CHILLS WITH OR WITHOUT FEVER  NAUSEA AND VOMITING THAT IS NOT CONTROLLED WITH YOUR NAUSEA MEDICATION  *UNUSUAL SHORTNESS OF BREATH  *UNUSUAL BRUISING OR BLEEDING  TENDERNESS IN MOUTH AND THROAT WITH OR WITHOUT PRESENCE OF ULCERS  *URINARY PROBLEMS  *BOWEL PROBLEMS  UNUSUAL RASH Items with * indicate a potential emergency and should be followed up as soon as possible. If you have an emergency after office hours please contact your primary care physician or go to the nearest emergency department.  Please call the clinic during office hours if you have any questions or concerns.   You may also contact the Patient Navigator at [phone] should you have any questions or need assistance in obtaining follow up care. _____________________________________________________________________ Have you asked about our STAR program?    STAR stands for Survivorship Training and Rehabilitation, and this is a nationally recognized cancer care program that focuses on survivorship and rehabilitation.  Cancer and cancer treatments may cause problems, such as, pain, making you feel tired and keeping you from doing the things that you need or want to do. Cancer rehabilitation can help. Our goal is to reduce these troubling effects and help you have the best quality of life possible.  You may receive a survey from a nurse that asks questions about your current state of health.  Based on the survey results, all eligible patients will be referred to the [facility] program for an evaluation  so we can better serve you! A frequently asked questions sheet is available upon request.

## 2014-09-24 ENCOUNTER — Telehealth: Payer: Self-pay | Admitting: Pulmonary Disease

## 2014-09-24 NOTE — Telephone Encounter (Signed)
PSG Split Night Study (05/25/12):  AHI 14.7 events/hour. ResMed Quattro Full Face. 15cm H2O. Baseline Sat 90% & Lowest Sat 75%.  IMAGING VENOUS DUPLEX LLE (06/26/14):  No evidence of DVT  CT CHEST W/O 01/16/14 (personally reviewed by me):  New right lower lobe reticulonodular infiltrate. Linear densities right upper lobe. Resolution of prior left lower lobe opacity. No pathologic mediastinal adenopathy. No pleural effusion or thickening. No pericardial effusion. Minimal esophageal thickening.   CARDIAC TTE (11/02/09):  LV ef 30-35% w/ diffuse hypokinesis. Normal wall thickness. RA normal in size. RV normal in size. Pulmonary artery poorly visualized. RVSP 69mmHg. No AS or AR. No MS w/ moderate MR. Tricuspid valve poorly visualized.  LABS 08/12/14 CBC:  6.8/10.3/34.3/184 BMP:  141/3.7/102/29/16/1.15/91/8.7 LFT:  3.5/6.5/0.5/71/17/12  IgG:  858 IgA:  44 IgM:  94  INR:  2.61

## 2014-09-25 ENCOUNTER — Encounter: Payer: Self-pay | Admitting: Pulmonary Disease

## 2014-09-25 ENCOUNTER — Ambulatory Visit (INDEPENDENT_AMBULATORY_CARE_PROVIDER_SITE_OTHER): Payer: Medicare Other | Admitting: Pulmonary Disease

## 2014-09-25 ENCOUNTER — Other Ambulatory Visit: Payer: Medicare Other

## 2014-09-25 VITALS — BP 116/70 | HR 78 | Ht 62.0 in | Wt 174.0 lb

## 2014-09-25 DIAGNOSIS — I871 Compression of vein: Secondary | ICD-10-CM

## 2014-09-25 DIAGNOSIS — G4733 Obstructive sleep apnea (adult) (pediatric): Secondary | ICD-10-CM

## 2014-09-25 DIAGNOSIS — I82409 Acute embolism and thrombosis of unspecified deep veins of unspecified lower extremity: Secondary | ICD-10-CM

## 2014-09-25 DIAGNOSIS — J328 Other chronic sinusitis: Secondary | ICD-10-CM | POA: Diagnosis not present

## 2014-09-25 DIAGNOSIS — J4540 Moderate persistent asthma, uncomplicated: Secondary | ICD-10-CM | POA: Diagnosis not present

## 2014-09-25 DIAGNOSIS — K219 Gastro-esophageal reflux disease without esophagitis: Secondary | ICD-10-CM | POA: Diagnosis not present

## 2014-09-25 HISTORY — DX: Acute embolism and thrombosis of unspecified deep veins of unspecified lower extremity: I82.409

## 2014-09-25 HISTORY — DX: Compression of vein: I87.1

## 2014-09-25 MED ORDER — TIOTROPIUM BROMIDE MONOHYDRATE 18 MCG IN CAPS: 18.0000 ug | ORAL_CAPSULE | Freq: Every day | RESPIRATORY_TRACT | Status: DC

## 2014-09-25 NOTE — Patient Instructions (Signed)
1. I'm going to check a download of your home CPAP to make sure it's working correctly. 2. We are going to check full breathing tests before or on your next appointment. 3. I'm starting you on Spiriva to help with your cough. If you have any trouble urinating or blurred vision discontinuing use and get seen immediately. Continue taking your Dulera & zafirlukast as prescribed. 4. I'm checking a swallowing test to see how well your reflux is controlled. 5. I'm checking blood work to see if you may have a fungus contributing to your cough. 6. We are scheduling you for a follow-up appointment in 3 months but please call me if you have any new breathing problems as I would be happy to see you sooner.

## 2014-09-25 NOTE — Progress Notes (Signed)
Subjective:    Patient ID: [name], female    DOB: 1955-03-13, 59 y.o.   MRN: [id number]  C.C.:  Follow-up for Asthma, Allergic/Chronic Rhinonsinusitis, OSA, DVT & GERD.  HPI Asthma:  She reports she is compliant with Dulera twice daily. She denies any exacerbations since last appointment. She reports she does have "coughing spells" that happen intermittently. She does cough at night intermittently. She does have some intermittent wheezing as well. She has been on Prednisone 5mg  daily for years. Compliant with Zafirlukast without adverse effect. She uses her rescue inhaler 1-2 times per week. She denies any particular triggers for her cough.  Allergic Rhinitis/Chronic Sinusitis:  Patient has a h/o of recurrent sinus infection/chronic sinusitis. She hasn't taken nasal steroids in some time. She uses NeilMed sinus rinse intermittently as needed. Continuing to take Zafirlukast. Cannot recall when she last had sinusitis requiring antibiotics. Previously had sinus surgery by Dr. Wilburn Cornelia. She denies any current sinus congestion, pressure, or drainage. Dose have a history of Pseudomonas infection in her sinuses.  OSA:  Has been on CPAP for a little over a month. She reports she is being tested for narcolepsy next Tuesday. She reports she uses a full face mask but has problems with leaks. She reports her sleep has improved with its use. She reports she is sleeping 5-6 hours a night & using it every night. She dozes off easily while standing or even when sitting watching TV.  DVT:  Patient had DVT & SVC syndrome from clot around her port-a-cath in 2012. Has been treated with coumadin ever since. Following with Heme-Onc.  GERD:  No reflux, dyspepsia, or odynophagia. No dysphagia. Continues to take Prilosec bid.  Review of Systems No chest pain or pressure. She reports intermittent temperature to 101F. No sweats. Does have chills with fever. She reports she gets frequent fevers. No dysuria or  hematuria. She gets [facility] month at [facility].   Allergies  Allergen Reactions  . Meperidine Hcl Anaphylaxis  . Montelukast Sodium Hives and Rash   Current Outpatient Prescriptions on File Prior to Visit  Medication Sig Dispense Refill  . acetaminophen (TYLENOL) 500 MG tablet Take 1,000 mg by mouth every 6 (six) hours as needed for pain or fever.     [name] Kitchen acyclovir (ZOVIRAX) 400 MG tablet TAKE ONE TABLET BY MOUTH TWICE DAILY. 60 tablet 3  . albuterol (PROVENTIL) (2.5 MG/3ML) 0.083% nebulizer solution Take 2.5 mg by nebulization every 4 (four) hours as needed for wheezing or shortness of breath.    . citalopram (CELEXA) 20 MG tablet Take 20 mg by mouth every morning.      . fluticasone (FLONASE) 50 MCG/ACT nasal spray Place 2 sprays into the nose 2 (two) times daily. 16 g 6  . furosemide (LASIX) 20 MG tablet 20 mg as needed.  5  . gabapentin (NEURONTIN) 300 MG capsule TAKE 1 CAPSULE BY MOUTH FOUR TIMES DAILY. 120 capsule 3  . mometasone-formoterol (DULERA) 200-5 MCG/ACT AERO Inhale 2 puffs into the lungs 2 (two) times daily. 3 Inhaler 4  . mupirocin cream (BACTROBAN) 2 % Apply 1 application topically 2 (two) times daily. 15 g 0  . omeprazole (PRILOSEC) 20 MG capsule Take 1 capsule (20 mg total) by mouth 2 (two) times daily before a meal. 180 capsule 4  . predniSONE (DELTASONE) 10 MG tablet Take 4 for four days 3 for four days 2 for four days then 1 daily and STAY (Patient taking differently: Take  5 mg by mouth daily. ) 60 tablet 11  . simvastatin (ZOCOR) 80 MG tablet Take 80 mg by mouth at bedtime.      . VENTOLIN HFA 108 (90 BASE) MCG/ACT inhaler INHALE 2 PUFFS INTO THE LUNGS EVERY 6 HOURS AS NEEDED. 18 g 3  . vitamin C (ASCORBIC ACID) 500 MG tablet Take 500 mg by mouth 2 (two) times daily.    [name] Kitchen warfarin (COUMADIN) 10 MG tablet Take 5 mg by mouth every evening.     . zafirlukast (ACCOLATE) 20 MG tablet TAKE 1 TABLET BY MOUTH TWICE DAILY. 60 tablet 6   No current  facility-administered medications on file prior to visit.   Past Medical History  Diagnosis Date  . Non Hodgkin's lymphoma   . Allergic rhinitis   . GERD (gastroesophageal reflux disease)   . Asthma   . Chronic sinusitis   . Respiratory failure   . Cavitary lung disease   . PONV (postoperative nausea and vomiting)   . Endotracheally intubated   . Myocardial infarction 2011  . Anxiety   . Chronic kidney disease   . Anemia   . Pneumonia 01/2013  . Hypogammaglobulinemia, acquired 12/30/2009    Qualifier: Diagnosis of  By: Joya Gaskins MD, Burnett Harry    Past Surgical History  Procedure Laterality Date  . Vesicovaginal fistula closure w/ tah    . Nasal sinus surgery    . Neck surgery      x 2   . Portacath placement  7/11    Removed 6/12  . Basal cell carcinoma excision  03/2011    scalp  . Port-a-cath removal    . Peripherally inserted central catheter insertion    . Picc removal    . Tracheostomy    . Abdominal hysterectomy    . Breast surgery Right   . Colonoscopy N/A 11/14/2012    Procedure: COLONOSCOPY;  Surgeon: Rogene Houston, MD;  Location: [facility];  Service: Endoscopy;  Laterality: N/A;  39   Family History  Problem Relation Age of Onset  . Emphysema Mother   . Allergies Father   . Asthma Father     as a child  . Leukemia Maternal Grandmother   . Diabetes Brother   . Stroke Mother    Social History   Social History  . Marital Status: Divorced    Spouse Name: N/A  . Number of Children: 3  . Years of Education: N/A   Occupational History  . [place] History Main Topics  . Smoking status: Never Smoker   . Smokeless tobacco: Never Used  . Alcohol Use: No  . Drug Use: No  . Sexual Activity: No   Other Topics Concern  . None   Social History Narrative   Originally from [place]. Always lived in [place]. Prior travel to [place]. 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 She has 1  dog currently. She has 2 conures (small parrots). No mold exposure in her home. 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 No hot tub exposure. She enjoys sewing & quilting.       Objective:   Physical Exam Blood pressure 116/70, pulse 78, height 5\' 2"  (1.575 m), weight 174 lb (78.926 kg), SpO2 97 %. General:  Awake. Alert. No acute distress. Mild central obesity.  Integument:  Warm & dry. No rash on exposed skin. Minimal bruising  on exposed skin of bilateral upper extremities. HEENT:  Moist mucus membranes. No oral ulcers. No scleral injection or icterus. Moderate bilateral nasal turbinate swelling. PERRL. Cardiovascular:  Regular rate. No edema. No appreciable JVD.  Pulmonary:  Mild coarse wheeze bilaterally. Symmetric chest wall expansion. No accessory muscle use on room air.  Abdomen: Soft. Normal bowel sounds. Nondistended. Grossly nontender. Musculoskeletal:  Normal bulk and tone. Hand grip strength 5/5 bilaterally. No joint deformity or effusion appreciated.  PSG Split Night Study (05/25/12): AHI 14.7 events/hour. ResMed Quattro Full Face. 15cm H2O. Baseline Sat 90% & Lowest Sat 75%.  IMAGING VENOUS DUPLEX LLE (06/26/14): No evidence of DVT  CT CHEST W/O 01/16/14 (personally reviewed by me): New right lower lobe reticulonodular infiltrate. Linear densities right upper lobe. Resolution of prior left lower lobe opacity. No pathologic mediastinal adenopathy. No pleural effusion or thickening. No pericardial effusion. Minimal esophageal thickening.   CARDIAC TTE (11/02/09): LV ef 30-35% w/ diffuse hypokinesis. Normal wall thickness. RA normal in size. RV normal in size. Pulmonary artery poorly visualized. RVSP 42mmHg. No AS or AR. No MS w/ moderate MR. Tricuspid valve poorly visualized.  MICROBIOLOGY SPUTUM (04/29/10):  M. catarrahlis  TRACH ASPIRATE (11/23/09):  P. aeruginosa BAL (11/15/09):  Bacteria & AFB negative  LABS 08/12/14 CBC: 6.8/10.3/34.3/184 BMP:  141/3.7/102/29/16/1.15/91/8.7 LFT: 3.5/6.5/0.5/71/17/12  IgG: 858 IgA: 44 IgM: 94  INR: 2.61    Assessment & Plan:  59 year old Caucasian female with known history of asthma & chronic/allergic sinusitis. Patient reports reflux is well-controlled on her current regimen of Prilosec. With her ongoing episodic cough I do feel her asthma is suboptimally controlled despite reported consistent use of Dulera & field test. As such I'm adding Spiriva to her regimen. I'm also going to further investigate the control of her reflux with a barium swallow. Certainly uncontrolled sleep apnea could be contributing to her poorly controlled asthma and I am ordering a compliance download for her CPAP. She does have an appointment for testing for possible narcolepsy, but I question whether or not her frequent dosing may simply be due to under controlled obstructive sleep apnea. The patient is continuing on systemic anticoagulation with her history of DVT & SVC syndrome. The cause of her SVC syndrome which was related to her previous port has been removed. She does have a history of lymphoma. I will defer to hematology/oncology on the need for further treatment with systemic anticoagulation.  1. Moderate, persistent asthma: Continuing patient on Dulera & zafirlukast. Adding Spiriva to regimen. Checking full PFT on her before next appointment. Checking serum IgE & Aspergillus antigen to screen for possible ABPA in the setting of migratory infiltrates. 2. OSA: Managed by outside physician. Checking CPAP compliance download given ongoing leak. 3. GERD: Patient continuing on Prilosec. Checking barium swallow. 4. Chronic sinusitis: Recommended using nasal saline rinse once daily. Continuing zafirlukast. 5. DVT & SVC syndrome: Currently on Coumadin. Last INR therapeutic. Routinely follows with hematology/oncology. 6. Follow-up: Patient to return to clinic in 3 months or sooner if needed.

## 2014-09-26 ENCOUNTER — Telehealth: Payer: Self-pay | Admitting: Pulmonary Disease

## 2014-09-26 LAB — IGE: IGE (IMMUNOGLOBULIN E), SERUM: 5 kU/L (ref <115)

## 2014-09-26 NOTE — Telephone Encounter (Signed)
[name] from [facility]. We requested a Download, we are connected in [place].  No need to contact them back

## 2014-09-26 NOTE — Telephone Encounter (Signed)
Download done and placed in Dr. Ashok Cordia look at. Will have him review when he returns to office.

## 2014-09-27 LAB — ASPERGILLUS ANTIGEN,SERUM
Aspergillus Ag, EIA: NOT DETECTED
INDEX VAL: 0.25 (ref <0.50)

## 2014-09-30 ENCOUNTER — Ambulatory Visit (HOSPITAL_BASED_OUTPATIENT_CLINIC_OR_DEPARTMENT_OTHER): Payer: Medicare Other | Attending: Neurology

## 2014-09-30 DIAGNOSIS — Z7952 Long term (current) use of systemic steroids: Secondary | ICD-10-CM | POA: Diagnosis not present

## 2014-09-30 DIAGNOSIS — Z79899 Other long term (current) drug therapy: Secondary | ICD-10-CM | POA: Insufficient documentation

## 2014-09-30 DIAGNOSIS — G4710 Hypersomnia, unspecified: Secondary | ICD-10-CM | POA: Insufficient documentation

## 2014-09-30 DIAGNOSIS — G4711 Idiopathic hypersomnia with long sleep time: Secondary | ICD-10-CM | POA: Insufficient documentation

## 2014-09-30 DIAGNOSIS — Z7901 Long term (current) use of anticoagulants: Secondary | ICD-10-CM | POA: Insufficient documentation

## 2014-10-01 ENCOUNTER — Ambulatory Visit (HOSPITAL_COMMUNITY)
Admission: RE | Admit: 2014-10-01 | Discharge: 2014-10-01 | Disposition: A | Discharge status: Home / Self Care | Payer: Medicare Other | Source: Ambulatory Visit | Attending: Pulmonary Disease | Admitting: Pulmonary Disease

## 2014-10-01 DIAGNOSIS — K219 Gastro-esophageal reflux disease without esophagitis: Secondary | ICD-10-CM | POA: Diagnosis not present

## 2014-10-03 ENCOUNTER — Ambulatory Visit: Payer: Medicare Other | Admitting: Pulmonary Disease

## 2014-10-06 NOTE — Sleep Study (Signed)
  [place] A. Merlene Laughter, MD     [email or web address]             NOCTURNAL POLYSOMNOGRAPHY   LOCATION: [facility]  Patient Name: [name], [name] Study Date: 09/30/2014 Gender: Female D.O.B: 1955/01/10 Age (years): 74 Referring Provider: Phillips Odor MD, ABSM Height (inches): 62 Interpreting Physician: Phillips Odor MD, ABSM Weight (lbs): 170 RPSGT: Jacolyn Reedy BMI: 31 MRN: [id number] Neck Size: 14.50 CLINICAL INFORMATION Sleep Study Type: MSLT  The patient was referred to the sleep center for evaluation of daytime sleepiness. Epworth Sleepiness Score: 20 The patient had a previous diagnostic post free which shows a AHI of 15 indicating a moderate apnea. She has been placed on CPAP and is compliant with this. The MSLT study was done with the patient being on CPAP. The patient has been on antidepressants for 5 weeks.  SLEEP STUDY TECHNIQUE A Multiple Sleep Latency Test was performed after an overnight polysomnogram according to the AASM scoring manual v2.3 (April 2016) and clinical guidelines. Five nap opportunities occurred over the course of the test which followed an overnight polysomnogram. The channels recorded and monitored were frontal, central, and occipital electroencephalography (EEG), right and left electrooculogram (EOG), chin electromyography (EMG), and electrocardiogram (EKG).  MEDICATIONS Medications administered by patient during sleep study : No sleep medicine administered.  Flonase (Fluticasone) Nasal Spray:   0.05 mg/inh (spray)  SIG-  2 inhalation  in each nostril    once daily    Furosemide (Lasix):   20 mg tablet  SIG-  1 each    in the AM      Neurontin (Gabapentin):  300 mg capsule  SIG-  1 cap(s)   qid   Levaquin: 750 mg tablet SIG-  daily     Dulera 2 puffs bid Prilosec (Omeprazole):  20 mg delayed release capsule  SIG-  1 each    once a day     Prednisone (Deltasone, Orasone): 10 mg tablet  SIG-  1 each    as directed      Simvastatin (Zocor):   80 mg tablet  SIG-  1 each   once a day   Vitamin C daily Coumadin (Warfarin):   5 mg tablet  SIG-   as directed   once a day    Accolate (zafirlukast)  Take one 20 mg. tablet twice daily Tylenol Extra Strength: Strength-  SIG-    q6hr prn (Zovirax) Acyclovir:   200 mg capsule  SIG-  1 each    Take 2 caps tid x 10 days (first episode)    prn Albuterol (Proventil, Ventolin) Inhaler:   90 mcg/inh aerosol with adapter  SIG-  1 puff(s)  to 2   every 6 hours    Albuterol Sulfate HFA: Strength-  SIG-  Dose-  Fr  IMPRESSION Total number of naps attempted: 5.00. Total number of naps with sleep attained: 5. The Mean Sleep Latency was 00:00:14 minutes. The was 1 sleep-onset REM periods.  DIAGNOSIS Pathologic Sleepiness (- [G47.10 ICD-10]) Idiopathic hypersomnia (327.11 [G47.11 ICD-10])     Delano Metz, MD Diplomate, American Board of Sleep Medicine.

## 2014-10-07 ENCOUNTER — Telehealth: Payer: Self-pay | Admitting: Pulmonary Disease

## 2014-10-07 NOTE — Telephone Encounter (Signed)
Result Note     Please call [name] and let her know the results of her barium swallow. The Prilosec looks like it's keeping her reflux in check. Thanks   ---  I spoke with patient about results and she verbalized understanding and had no questions

## 2014-10-08 ENCOUNTER — Ambulatory Visit (HOSPITAL_COMMUNITY)
Admission: RE | Admit: 2014-10-08 | Discharge: 2014-10-08 | Disposition: A | Discharge status: Home / Self Care | Payer: Medicare Other | Source: Ambulatory Visit | Attending: Pulmonary Disease | Admitting: Pulmonary Disease

## 2014-10-08 DIAGNOSIS — J4540 Moderate persistent asthma, uncomplicated: Secondary | ICD-10-CM | POA: Insufficient documentation

## 2014-10-08 LAB — PULMONARY FUNCTION TEST
DL/VA % pred: 102 %
DL/VA: 4.66 ml/min/mmHg/L
DLCO unc % pred: 69 %
DLCO unc: 14.99 ml/min/mmHg
FEF 25-75 Post: 0.71 L/sec
FEF 25-75 Pre: 0.75 L/sec
FEF2575-%Change-Post: -5 %
FEF2575-%PRED-PRE: 33 %
FEF2575-%Pred-Post: 31 %
FEV1-%Change-Post: 11 %
FEV1-%PRED-POST: 52 %
FEV1-%PRED-PRE: 47 %
FEV1-PRE: 1.14 L
FEV1-Post: 1.26 L
FEV1FVC-%CHANGE-POST: 1 %
FEV1FVC-%Pred-Pre: 86 %
FEV6-%Change-Post: 9 %
FEV6-%Pred-Post: 61 %
FEV6-%Pred-Pre: 56 %
FEV6-POST: 1.83 L
FEV6-PRE: 1.67 L
FEV6FVC-%Change-Post: 0 %
FEV6FVC-%PRED-POST: 103 %
FEV6FVC-%PRED-PRE: 104 %
FVC-%Change-Post: 9 %
FVC-%PRED-PRE: 54 %
FVC-%Pred-Post: 59 %
FVC-POST: 1.84 L
FVC-Pre: 1.68 L
POST FEV6/FVC RATIO: 99 %
PRE FEV1/FVC RATIO: 67 %
Post FEV1/FVC ratio: 68 %
Pre FEV6/FVC Ratio: 100 %
RV % pred: 177 %
RV: 3.33 L
TLC % pred: 108 %
TLC: 5.16 L

## 2014-10-08 MED ORDER — ALBUTEROL SULFATE (2.5 MG/3ML) 0.083% IN NEBU
2.5000 mg | INHALATION_SOLUTION | Freq: Once | RESPIRATORY_TRACT | Status: AC
  Administered 2014-10-08: 2.5 mg via RESPIRATORY_TRACT

## 2014-10-09 ENCOUNTER — Encounter (HOSPITAL_COMMUNITY): Payer: Medicare Other

## 2014-10-13 ENCOUNTER — Encounter (HOSPITAL_COMMUNITY): Payer: Medicare Other

## 2014-10-21 ENCOUNTER — Encounter (HOSPITAL_COMMUNITY): Payer: Medicare Other

## 2014-10-23 ENCOUNTER — Encounter (HOSPITAL_COMMUNITY): Payer: Self-pay

## 2014-10-23 ENCOUNTER — Encounter (HOSPITAL_COMMUNITY): Payer: Medicare Other | Attending: Hematology and Oncology

## 2014-10-23 VITALS — BP 127/57 | HR 71 | Temp 98.1°F | Resp 18 | Wt 179.2 lb

## 2014-10-23 DIAGNOSIS — D869 Sarcoidosis, unspecified: Secondary | ICD-10-CM | POA: Insufficient documentation

## 2014-10-23 DIAGNOSIS — Z23 Encounter for immunization: Secondary | ICD-10-CM

## 2014-10-23 DIAGNOSIS — C8590 Non-Hodgkin lymphoma, unspecified, unspecified site: Secondary | ICD-10-CM | POA: Insufficient documentation

## 2014-10-23 DIAGNOSIS — D800 Hereditary hypogammaglobulinemia: Secondary | ICD-10-CM | POA: Insufficient documentation

## 2014-10-23 DIAGNOSIS — D801 Nonfamilial hypogammaglobulinemia: Secondary | ICD-10-CM

## 2014-10-23 MED ORDER — IMMUNE GLOBULIN (HUMAN) 10 GM/100ML IV SOLN
50.0000 g | Freq: Once | INTRAVENOUS | Status: AC
  Administered 2014-10-23: 50 g via INTRAVENOUS
  Filled 2014-10-23: qty 500

## 2014-10-23 MED ORDER — ACETAMINOPHEN 325 MG PO TABS
650.0000 mg | ORAL_TABLET | Freq: Four times a day (QID) | ORAL | Status: DC | PRN
  Filled 2014-10-23: qty 2

## 2014-10-23 MED ORDER — SODIUM CHLORIDE 0.9 % IJ SOLN: 10.0000 mL | INTRAMUSCULAR | Status: DC | PRN

## 2014-10-23 MED ORDER — DIPHENHYDRAMINE HCL 25 MG PO TABS
25.0000 mg | ORAL_TABLET | Freq: Once | ORAL | Status: DC
  Filled 2014-10-23 (×2): qty 1

## 2014-10-23 MED ORDER — SODIUM CHLORIDE 0.9 % IV SOLN
Freq: Once | INTRAVENOUS | Status: AC
  Administered 2014-10-23: 09:00:00 via INTRAVENOUS

## 2014-10-23 MED ORDER — INFLUENZA VAC SPLIT QUAD 0.5 ML IM SUSY
0.5000 mL | PREFILLED_SYRINGE | Freq: Once | INTRAMUSCULAR | Status: AC
  Administered 2014-10-23: 0.5 mL via INTRAMUSCULAR
  Filled 2014-10-23: qty 0.5

## 2014-10-23 NOTE — Patient Instructions (Addendum)
[place] at [facility] Discharge Instructions  RECOMMENDATIONS MADE BY THE CONSULTANT AND ANY TEST RESULTS WILL BE SENT TO YOUR REFERRING PHYSICIAN.  IVIG today IVIG monthly Flu shot given Doctors appt in month with labn work Please call the clinic if you have any questions or concerns  Thank you for choosing [place] at [facility] to provide your oncology and hematology care.  To afford each patient quality time with our provider, please arrive at least 15 minutes before your scheduled appointment time.    You need to re-schedule your appointment should you arrive 10 or more minutes late.  We strive to give you quality time with our providers, and arriving late affects you and other patients whose appointments are after yours.  Also, if you no show three or more times for appointments you may be dismissed from the clinic at the providers discretion.     Again, thank you for choosing [facility].  Our hope is that these requests will decrease the amount of time that you wait before being seen by our physicians.       _____________________________________________________________  Should you have questions after your visit to [facility], please contact our office at [phone] between the hours of 8:30 a.m. and 4:30 p.m.  Voicemails left after 4:30 p.m. will not be returned until the following business day.  For prescription refill requests, have your pharmacy contact our office.

## 2014-10-23 NOTE — Progress Notes (Signed)
[name] Tolerated IVIG well today Discharged ambulatory   [name] Given flu vaccine in left deltoid per MD order

## 2014-11-12 ENCOUNTER — Ambulatory Visit (HOSPITAL_COMMUNITY): Payer: Medicare Other | Admitting: Hematology & Oncology

## 2014-11-12 ENCOUNTER — Encounter (HOSPITAL_COMMUNITY): Payer: Medicare Other | Attending: Hematology and Oncology | Admitting: Hematology & Oncology

## 2014-11-12 ENCOUNTER — Encounter (HOSPITAL_COMMUNITY): Payer: Self-pay | Admitting: Hematology & Oncology

## 2014-11-12 ENCOUNTER — Encounter (HOSPITAL_COMMUNITY): Payer: Medicare Other

## 2014-11-12 ENCOUNTER — Encounter (HOSPITAL_COMMUNITY): Payer: Medicare Other | Attending: Hematology & Oncology

## 2014-11-12 VITALS — BP 140/80 | HR 78 | Temp 98.5°F | Resp 20 | Wt 177.8 lb

## 2014-11-12 DIAGNOSIS — J45909 Unspecified asthma, uncomplicated: Secondary | ICD-10-CM

## 2014-11-12 DIAGNOSIS — D800 Hereditary hypogammaglobulinemia: Secondary | ICD-10-CM | POA: Insufficient documentation

## 2014-11-12 DIAGNOSIS — D649 Anemia, unspecified: Secondary | ICD-10-CM | POA: Diagnosis not present

## 2014-11-12 DIAGNOSIS — J329 Chronic sinusitis, unspecified: Secondary | ICD-10-CM | POA: Diagnosis not present

## 2014-11-12 DIAGNOSIS — D801 Nonfamilial hypogammaglobulinemia: Secondary | ICD-10-CM | POA: Diagnosis not present

## 2014-11-12 DIAGNOSIS — C8338 Diffuse large B-cell lymphoma, lymph nodes of multiple sites: Secondary | ICD-10-CM

## 2014-11-12 DIAGNOSIS — J40 Bronchitis, not specified as acute or chronic: Secondary | ICD-10-CM | POA: Diagnosis not present

## 2014-11-12 DIAGNOSIS — C8590 Non-Hodgkin lymphoma, unspecified, unspecified site: Secondary | ICD-10-CM | POA: Diagnosis present

## 2014-11-12 DIAGNOSIS — Z7901 Long term (current) use of anticoagulants: Secondary | ICD-10-CM

## 2014-11-12 DIAGNOSIS — D869 Sarcoidosis, unspecified: Secondary | ICD-10-CM | POA: Diagnosis present

## 2014-11-12 DIAGNOSIS — Z8572 Personal history of non-Hodgkin lymphomas: Secondary | ICD-10-CM

## 2014-11-12 LAB — COMPREHENSIVE METABOLIC PANEL
ALBUMIN: 3.5 g/dL (ref 3.5–5.0)
ALT: 14 U/L (ref 14–54)
ANION GAP: 7 (ref 5–15)
AST: 17 U/L (ref 15–41)
Alkaline Phosphatase: 87 U/L (ref 38–126)
BILIRUBIN TOTAL: 0.2 mg/dL — AB (ref 0.3–1.2)
BUN: 14 mg/dL (ref 6–20)
CO2: 30 mmol/L (ref 22–32)
Calcium: 8.6 mg/dL — ABNORMAL LOW (ref 8.9–10.3)
Chloride: 103 mmol/L (ref 101–111)
Creatinine, Ser: 0.99 mg/dL (ref 0.44–1.00)
GFR calc Af Amer: >60 mL/min (ref >60)
GLUCOSE: 113 mg/dL — AB (ref 65–99)
POTASSIUM: 3.9 mmol/L (ref 3.5–5.1)
Sodium: 140 mmol/L (ref 135–145)
TOTAL PROTEIN: 6.6 g/dL (ref 6.5–8.1)

## 2014-11-12 LAB — CBC WITH DIFFERENTIAL/PLATELET
Basophils Absolute: 0 10*3/uL (ref 0.0–0.1)
Basophils Relative: 1 %
EOS PCT: 3 %
Eosinophils Absolute: 0.2 10*3/uL (ref 0.0–0.7)
HEMATOCRIT: 32.6 % — AB (ref 36.0–46.0)
Hemoglobin: 9.9 g/dL — ABNORMAL LOW (ref 12.0–15.0)
LYMPHS ABS: 1.2 10*3/uL (ref 0.7–4.0)
LYMPHS PCT: 20 %
MCH: 26.3 pg (ref 26.0–34.0)
MCHC: 30.4 g/dL (ref 30.0–36.0)
MCV: 86.7 fL (ref 78.0–100.0)
MONOS PCT: 9 %
Monocytes Absolute: 0.5 10*3/uL (ref 0.1–1.0)
Neutro Abs: 4 10*3/uL (ref 1.7–7.7)
Neutrophils Relative %: 67 %
PLATELETS: 179 10*3/uL (ref 150–400)
RBC: 3.76 MIL/uL — AB (ref 3.87–5.11)
RDW: 15.6 % — AB (ref 11.5–15.5)
WBC: 5.8 10*3/uL (ref 4.0–10.5)

## 2014-11-12 MED ORDER — LEVOFLOXACIN 250 MG PO TABS: ORAL_TABLET | ORAL | Status: DC

## 2014-11-12 NOTE — Patient Instructions (Signed)
[place] at [facility] Discharge Instructions  RECOMMENDATIONS MADE BY THE CONSULTANT AND ANY TEST RESULTS WILL BE SENT TO YOUR REFERRING PHYSICIAN.  Lab work today. CT scan to be scheduled. Continue monthly IVIG. Monthly lab work. MD appointment in 2 months. Return as scheduled.  Thank you for choosing [place] at [facility] to provide your oncology and hematology care.  To afford each patient quality time with our provider, please arrive at least 15 minutes before your scheduled appointment time.    You need to re-schedule your appointment should you arrive 10 or more minutes late.  We strive to give you quality time with our providers, and arriving late affects you and other patients whose appointments are after yours.  Also, if you no show three or more times for appointments you may be dismissed from the clinic at the providers discretion.     Again, thank you for choosing [facility].  Our hope is that these requests will decrease the amount of time that you wait before being seen by our physicians.       _____________________________________________________________  Should you have questions after your visit to [facility], please contact our office at [phone] between the hours of 8:30 a.m. and 4:30 p.m.  Voicemails left after 4:30 p.m. will not be returned until the following business day.  For prescription refill requests, have your pharmacy contact our office.

## 2014-11-12 NOTE — Progress Notes (Signed)
[name]., MD [place] [id number]    Hypogammaglobulenemia/IgG deficiency with level of 414 mg/dl Maintenance Rituxan 09/30/2011 Hx of Diffuse large B-Cell Lymphoma, S/P R-CHOP x 4 cycles with intrathecal chemotherapy during cycles 3 and 4 with methotrexate and Solu-Cortef. Treatment was stopped due to Pseudomonas sepsis after cycles 2 and 4 which was very severe  CURRENT THERAPY:50 g IV IgG monthly for IgG deficiency.  INTERVAL HISTORY: [name] 59 y.o. female returns for IgG deficiency.    [name] is here alone today. She has been diagnosed with narcolepsy after doing a sleep study with Dr. Merlene [name]. She states that now that she's on her new narcolepsy medicine, she is no longer falling asleep standing up.  She states that she has Pseudomonas in her nose and she's been "washing" her nose non-stop. She has a noticeable cough and congestion. She reports significant sinus symptoms and cough for 2 weeks   She has had her flu vaccine. She sees her Pulmonologist again in December, and is due for her mammogram in June (?).  During the physical exam, she gestures to her breast and says she has a "dull aching pain in there again, like when we first found the cancer." She confirms having some low grade fevers.  Her pharmacy is [facility].  MEDICAL HISTORY: Past Medical History  Diagnosis Date  . Non Hodgkin's lymphoma ([place])   . Allergic rhinitis   . GERD (gastroesophageal reflux disease)   . Asthma   . Chronic sinusitis   . Respiratory failure ([place])   . Cavitary lung disease   . PONV (postoperative nausea and vomiting)   . Endotracheally intubated   . Myocardial infarction ([place]) 2011  . Anxiety   . Chronic kidney disease   . Anemia   . Pneumonia 01/2013  . 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     is allergic to meperidine hcl and montelukast sodium.  [name] does not currently have medications on file.  SURGICAL HISTORY: Past Surgical History  Procedure Laterality Date  . Vesicovaginal fistula closure w/ tah    . Nasal sinus surgery    . Neck surgery      x 2   . Portacath placement  7/11    Removed 6/12  . Basal cell carcinoma excision  03/2011    scalp  . Port-a-cath removal    . Peripherally inserted central catheter insertion    . Picc removal    . Tracheostomy    . Abdominal hysterectomy    . Breast surgery Right   . Colonoscopy N/A 11/14/2012    Procedure: COLONOSCOPY;  Surgeon: Rogene Houston, MD;  Location: [facility];  Service: Endoscopy;  Laterality: N/A;  830    Outpatient Encounter Prescriptions as of 11/12/2014  Medication Sig Note  . acetaminophen (TYLENOL) 500 MG tablet Take 1,000 mg by mouth every 6 (six) hours as needed for pain or fever.    [name] Kitchen acyclovir (ZOVIRAX) 400 MG tablet TAKE ONE TABLET BY MOUTH TWICE DAILY.   [name] Kitchen albuterol (PROVENTIL) (2.5 MG/3ML)  0.083% nebulizer solution Take 2.5 mg by nebulization every 4 (four) hours as needed for wheezing or shortness of breath.   . Armodafinil (NUVIGIL) 150 MG tablet Take 150 mg by mouth daily.   . citalopram (CELEXA) 20 MG tablet Take 20 mg by mouth every morning.     . furosemide (LASIX) 20 MG tablet Take 20 mg by mouth as needed for fluid.  04/02/2014: Received from: External Pharmacy Received Sig:   . gabapentin (NEURONTIN) 300 MG capsule TAKE 1 CAPSULE BY MOUTH FOUR TIMES DAILY.   . mometasone-formoterol (DULERA) 200-5  MCG/ACT AERO Inhale 2 puffs into the lungs 2 (two) times daily.   [name] Kitchen omeprazole (PRILOSEC) 20 MG capsule Take 1 capsule (20 mg total) by mouth 2 (two) times daily before a meal.   . predniSONE (DELTASONE) 10 MG tablet Take 4 for four days 3 for four days 2 for four days then 1 daily and STAY (Patient taking differently: Take 5 mg by mouth daily. )   . simvastatin (ZOCOR) 80 MG tablet Take 80 mg by mouth at bedtime.     [name] Kitchen tiotropium (SPIRIVA) 18 MCG inhalation capsule Place 1 capsule (18 mcg total) into inhaler and inhale daily.   . VENTOLIN HFA 108 (90 BASE) MCG/ACT inhaler INHALE 2 PUFFS INTO THE LUNGS EVERY 6 HOURS AS NEEDED.   [name] Kitchen vitamin C (ASCORBIC ACID) 500 MG tablet Take 500 mg by mouth 2 (two) times daily.   [name] Kitchen warfarin (COUMADIN) 10 MG tablet Take 5 mg by mouth every evening.    . zafirlukast (ACCOLATE) 20 MG tablet TAKE 1 TABLET BY MOUTH TWICE DAILY.   . fluticasone (FLONASE) 50 MCG/ACT nasal spray Place 2 sprays into the nose 2 (two) times daily. (Patient not taking: Reported on 11/12/2014)   . ketorolac (ACULAR) 0.5 % ophthalmic solution  11/12/2014: Received from: External Pharmacy  . levofloxacin (LEVAQUIN) 250 MG tablet Take 2 tablets on first day then one tablet daily therafter for a total of 14 days   . ofloxacin (OCUFLOX) 0.3 % ophthalmic solution  11/12/2014: Received from: External Pharmacy  . prednisoLONE acetate (PRED FORTE) 1 % ophthalmic suspension  11/12/2014: Received from: External Pharmacy   No facility-administered encounter medications on file as of 11/12/2014.    SOCIAL HISTORY: Social History   Social History  . Marital Status: Divorced    Spouse Name: N/A  . Number of Children: 3  . Years of Education: N/A   Occupational History  . [place] History Main Topics  . Smoking status: Never Smoker   . Smokeless tobacco: Never Used  . Alcohol Use: No  . Drug Use: No  . Sexual Activity: No   Other Topics Concern  . Not on file   Social  History Narrative   Originally from [place]. Always lived in [place]. Prior travel to [place]. 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 She has 1 dog currently. She has 2 conures (small parrots). No mold exposure in her home. 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 No hot tub exposure. She enjoys sewing & quilting.     FAMILY HISTORY: Family History  Problem Relation Age of Onset  . Emphysema Mother   . Allergies Father   . Asthma Father     as a child  . Leukemia Maternal Grandmother   . Diabetes Brother   . Stroke Mother  Review of Systems  Constitutional: Negative for fever, chills, weight loss and malaise/fatigue.  HENT: Positive for congestion and sinus drainage, hearing loss, nosebleeds, sore throat and tinnitus.   Eyes: Negative for blurred vision, double vision, pain and discharge.  Respiratory: Positive for cough, Negative for hemoptysis, sputum production, shortness of breath and wheezing.   Cardiovascular: Negative for chest pain, palpitations, claudication, leg swelling and PND.  Gastrointestinal: Negative for heartburn, nausea, vomiting, abdominal pain, diarrhea, constipation, blood in stool and melena.  Genitourinary: Negative for dysuria, urgency, frequency and hematuria.  Musculoskeletal: Negative for myalgias, joint pain and falls.  Skin: Negative for itching and rash.  Neurological: Negative for dizziness, tingling, tremors, sensory change, speech change, focal weakness, seizures, loss of consciousness, weakness and headaches.  Endo/Heme/Allergies: Does not bleed easily.  Psychiatric/Behavioral: Negative for depression, suicidal ideas, memory loss and substance abuse. The patient is not nervous/anxious and does not have insomnia.   14 point review of systems was performed and is negative except as detailed under history of present illness and above   PHYSICAL EXAMINATION  ECOG PERFORMANCE STATUS: 0 -  Asymptomatic  Filed Vitals:   11/12/14 0955  BP: 140/80  Pulse: 78  Temp: 98.5 F (36.9 C)  Resp: 20    Physical Exam  Constitutional: She is oriented to person, place, and time and well-developed, well-nourished, and in no distress.  HENT:  Head: Normocephalic and atraumatic.  Nose: Nose normal.  Mouth/Throat: Oropharynx is clear and moist. No oropharyngeal exudate.  Eyes: Conjunctivae and EOM are normal. Pupils are equal, round, and reactive to light. Right eye exhibits no discharge. Left eye exhibits no discharge. No scleral icterus.  Neck: Normal range of motion. Neck supple. No tracheal deviation present. No thyromegaly present.  Cardiovascular: Normal rate, regular rhythm and normal heart sounds.  Exam reveals no gallop and no friction rub.   No murmur heard. Pulmonary/Chest:   Bilateral, coarse rhonchi L>R Abdominal: Soft. Bowel sounds are normal. She exhibits no distension and no mass. There is no tenderness. There is no rebound and no guarding.  Musculoskeletal: Normal range of motion. She exhibits no edema.  Lymphadenopathy:    She has no cervical adenopathy.  Neurological: She is alert and oriented to person, place, and time. She has normal reflexes. No cranial nerve deficit. Gait normal. Coordination normal.  Skin: Skin is warm and dry. No rash noted.  Psychiatric: Mood, memory, affect and judgment normal.  Nursing note and vitals reviewed.   LABORATORY DATA: I have reviewed the data as listed.  CBC    Component Value Date/Time   WBC 5.8 11/12/2014 1035   RBC 3.76* 11/12/2014 1035   RBC 3.71* 01/10/2014 0903   HGB 9.9* 11/12/2014 1035   HCT 32.6* 11/12/2014 1035   PLT 179 11/12/2014 1035   MCV 86.7 11/12/2014 1035   MCH 26.3 11/12/2014 1035   MCHC 30.4 11/12/2014 1035   RDW 15.6* 11/12/2014 1035   LYMPHSABS 1.2 11/12/2014 1035   MONOABS 0.5 11/12/2014 1035   EOSABS 0.2 11/12/2014 1035   BASOSABS 0.0 11/12/2014 1035   CMP     Component Value  Date/Time   NA 140 11/12/2014 1035   K 3.9 11/12/2014 1035   CL 103 11/12/2014 1035   CO2 30 11/12/2014 1035   GLUCOSE 113* 11/12/2014 1035   BUN 14 11/12/2014 1035   CREATININE 0.99 11/12/2014 1035   CALCIUM 8.6* 11/12/2014 1035   PROT 6.6 11/12/2014 1035   ALBUMIN 3.5 11/12/2014 1035   AST 17  11/12/2014 1035   ALT 14 11/12/2014 1035   ALKPHOS 87 11/12/2014 1035   BILITOT 0.2* 11/12/2014 1035   GFRNONAA >60 11/12/2014 1035   GFRAA >60 11/12/2014 1035     RADIOLOGY:  EXAM: CT CHEST WITHOUT CONTRAST  TECHNIQUE: Multidetector CT imaging of the chest was performed following the standard protocol without IV contrast.  COMPARISON: 06/24/2013 PET scan  FINDINGS: Stable mild bilateral lower lobe bronchitic change. In the medial right lower lobe there is mild reticular nodular infiltrate, new from prior study. Mild linear densities lateral right upper lobe image number 26, likely representing scarring as it is not significantly changed.  Subsegmental atelectasis inferior lateral lingula similar to prior study. The previously described rounded opacity in the left lower lobe that measured 8 x 8 mm is not seen current study. Mild right lung base scarring and atelectasis is very similar to the prior study.  Thoracic inlet and thyroid are normal. Small mediastinal lymph nodes, none of which are pathologic by size, not significantly different from prior study. No pleural or pericardial effusion. No acute musculoskeletal findings.  IMPRESSION: 1. Interval resolution of 8 mm pulmonary nodule left lower lobe of 2. Interval development of mild reticular nodular infiltrate medially in the right lower lobe seen on images 37 through 42. This could represent infectious or inflammatory etiology, although malignancy including lymphangitic tumor spread is not excluded.   Electronically Signed  By: Skipper Cliche M.D.  On: 01/16/2014 10:53   ASSESSMENT and THERAPY  PLAN:   Hypogammaglobulinemia Asthma  She will continue with IVIG.  I will see her back in 3 months. She continues to follow with pulmonary. I have recommended she discuss with Dr. Joya Gaskins his recommendations on who to see after his departure.  Diffuse large B-cell lymphoma  She remains without evidence of obvious recurrence. We will continue ongoing observation. I advised her that her disease was in 2011 and at this point she is unlikely to have a recurrence.  Given her abdominal discomfort we will obtain a CT abdomen. If it is WNL, I advised her that I would not recommend ongoing imaging.   Anemia History of iron deficiency Colonoscopy in 2014 with multiple polyps Ongoing coumadin use  We will check anemia evaluation today including ferritin given her prior iron deficiency.   Bronchitis/Sinusitis  She was given a prescription for levaquin. She was advised her INR will need to be closely monitored while on levaquin.   Orders Placed This Encounter  Procedures  . CT Abdomen Pelvis W Contrast    Standing Status: Future     Number of Occurrences:      Standing Expiration Date: 11/12/2015    Order Specific Question:  If indicated for the ordered procedure, I authorize the administration of contrast media per Radiology protocol    Answer:  Yes    Order Specific Question:  Reason for Exam (SYMPTOM  OR DIAGNOSIS REQUIRED)    Answer:  history lymphoma with new abdominal pain    Order Specific Question:  Is the patient pregnant?    Answer:  No    Order Specific Question:  Preferred imaging location?    Answer:  [facility]  . CBC with Differential    Standing Status: Standing     Number of Occurrences: 18     Standing Expiration Date: 11/11/2016  . Comprehensive metabolic panel    Standing Status: Standing     Number of Occurrences: 18     Standing Expiration Date: 11/11/2016  .  IgG, IgA, IgM    Standing Status: Standing     Number of Occurrences: 18     Standing Expiration  Date: 11/11/2016   All questions were answered. The patient knows to call the clinic with any problems, questions or concerns. We can certainly see the patient much sooner if necessary.   This document serves as a record of services personally performed by Ancil Linsey, MD. It was created on her behalf by Toni Amend, a trained medical scribe. The creation of this record is based on the scribe's personal observations and the provider's statements to them. This document has been checked and approved by the attending provider.  I have reviewed the above documentation for accuracy and completeness, and I agree with the above.  This note was signed electronically.  Kelby Fam. Whitney Muse, MD

## 2014-11-13 ENCOUNTER — Ambulatory Visit (HOSPITAL_COMMUNITY)
Admission: RE | Admit: 2014-11-13 | Discharge: 2014-11-13 | Disposition: A | Discharge status: Home / Self Care | Payer: Medicare Other | Source: Ambulatory Visit | Attending: Ophthalmology | Admitting: Ophthalmology

## 2014-11-13 ENCOUNTER — Encounter (HOSPITAL_COMMUNITY): Admission: RE | Admit: 2014-11-13 | Payer: Medicare Other | Source: Ambulatory Visit

## 2014-11-13 ENCOUNTER — Other Ambulatory Visit: Payer: Self-pay

## 2014-11-13 ENCOUNTER — Encounter (HOSPITAL_COMMUNITY)
Admission: RE | Admit: 2014-11-13 | Discharge: 2014-11-13 | Disposition: A | Discharge status: Home / Self Care | Payer: Medicare Other | Source: Ambulatory Visit | Attending: Ophthalmology | Admitting: Ophthalmology

## 2014-11-13 ENCOUNTER — Encounter (HOSPITAL_COMMUNITY): Payer: Self-pay

## 2014-11-13 DIAGNOSIS — Z981 Arthrodesis status: Secondary | ICD-10-CM | POA: Insufficient documentation

## 2014-11-13 DIAGNOSIS — M8588 Other specified disorders of bone density and structure, other site: Secondary | ICD-10-CM | POA: Diagnosis not present

## 2014-11-13 DIAGNOSIS — J479 Bronchiectasis, uncomplicated: Secondary | ICD-10-CM | POA: Insufficient documentation

## 2014-11-13 DIAGNOSIS — Z01818 Encounter for other preprocedural examination: Secondary | ICD-10-CM | POA: Diagnosis not present

## 2014-11-13 LAB — IGG, IGA, IGM
IGA: 57 mg/dL — AB (ref 87–352)
IGM, SERUM: 112 mg/dL (ref 26–217)
IgG (Immunoglobin G), Serum: 873 mg/dL (ref 700–1600)

## 2014-11-13 NOTE — Patient Instructions (Signed)
[name]  11/13/2014     @PREFPERIOPPHARMACY @   Your procedure is scheduled on 11/17/2014.  Report to [name] at 9:30 A.M.  Call this number if you have problems the morning of surgery:  [phone]   Remember:  Do not eat food or drink liquids after midnight.  Take these medicines the morning of surgery with A SIP OF WATER CELEXA, OMEPRAZOLE, PREDNISONE, SPIRIVA.  PLEASE BE SURE TO TAKE YOUR INHALERS VENTOLIN AND ALBUTEROL BEFORE LEAVING HOME AND BRING THEM WITH YOU TO [place].   Do not wear jewelry, make-up or nail polish.  Do not wear lotions, powders, or perfumes.  You may wear deodorant.  Do not shave 48 hours prior to surgery.  Men may shave face and neck.  Do not bring valuables to the hospital.  [facility] is not responsible for any belongings or valuables.  Contacts, dentures or bridgework may not be worn into surgery.  Leave your suitcase in the car.  After surgery it may be brought to your room.  For patients admitted to the hospital, discharge time will be determined by your treatment team.  Patients discharged the day of surgery will not be allowed to drive home.   Name and phone number of your driver:   [place] Special instructions:  N/A  Please read over the following fact sheets that you were given. Care and Recovery After Surgery    Cataract A cataract is a clouding of the lens of the eye. When a lens becomes cloudy, vision is reduced based on the degree and nature of the clouding. Many cataracts reduce vision to some degree. Some cataracts make people more near-sighted as they develop. Other cataracts increase glare. Cataracts that are ignored and become worse can sometimes look white. The white color can be seen through the pupil. CAUSES   Aging. However, cataracts may occur at any age, even in newborns.  Certain drugs.  Trauma to the eye.  Certain diseases such as diabetes.  Specific eye diseases such as chronic  inflammation inside the eye or a sudden attack of a rare form of glaucoma.  Inherited or acquired medical problems. SYMPTOMS   Gradual, progressive drop in vision in the affected eye.  Severe, rapid visual loss. This most often happens when trauma is the cause. DIAGNOSIS  To detect a cataract, an eye doctor examines the lens. Cataracts are best diagnosed with an exam of the eyes with the pupils enlarged (dilated) by drops.  TREATMENT  For an early cataract, vision may improve by using different eyeglasses or stronger lighting. If that does not help your vision, surgery is the only effective treatment. A cataract needs to be surgically removed when vision loss interferes with your everyday activities, such as driving, reading, or watching TV. A cataract may also have to be removed if it prevents examination or treatment of another eye problem. Surgery removes the cloudy lens and usually replaces it with a substitute lens (intraocular lens, IOL).  At a time when both you and your doctor agree, the cataract will be surgically removed. If you have cataracts in both eyes, only one is usually removed at a time. This allows the operated eye to heal and be out of danger from any possible problems after surgery (such as infection or poor wound healing). In rare cases, a cataract may be doing damage to your eye. In these cases, your caregiver may advise surgical removal right away. The vast majority of people who  have cataract surgery have better vision afterward. HOME CARE INSTRUCTIONS  If you are not planning surgery, you may be asked to do the following:  Use different eyeglasses.  Use stronger or brighter lighting.  Ask your eye doctor about reducing your medicine dose or changing medicines if it is thought that a medicine caused your cataract. Changing medicines does not make the cataract go away on its own.  Become familiar with your surroundings. Poor vision can lead to injury. Avoid bumping into  things on the affected side. You are at a higher risk for tripping or falling.  Exercise extreme care when driving or operating machinery.  Wear sunglasses if you are sensitive to bright light or experiencing problems with glare. SEEK IMMEDIATE MEDICAL CARE IF:   You have a worsening or sudden vision loss.  You notice redness, swelling, or increasing pain in the eye.  You have a fever.   This information is not intended to replace advice given to you by your health care provider. Make sure you discuss any questions you have with your health care provider.   Document Released: 12/20/2004 Document Revised: 03/14/2011 Document Reviewed: 06/25/2014 Elsevier Interactive Patient Education [employer].

## 2014-11-14 ENCOUNTER — Encounter (HOSPITAL_BASED_OUTPATIENT_CLINIC_OR_DEPARTMENT_OTHER): Payer: Medicare Other

## 2014-11-14 DIAGNOSIS — D649 Anemia, unspecified: Secondary | ICD-10-CM

## 2014-11-14 DIAGNOSIS — D800 Hereditary hypogammaglobulinemia: Secondary | ICD-10-CM | POA: Diagnosis not present

## 2014-11-14 DIAGNOSIS — C8590 Non-Hodgkin lymphoma, unspecified, unspecified site: Secondary | ICD-10-CM

## 2014-11-14 LAB — LACTATE DEHYDROGENASE: LDH: 154 U/L (ref 98–192)

## 2014-11-14 LAB — FOLATE: FOLATE: 15.6 ng/mL (ref >5.9)

## 2014-11-14 LAB — RETICULOCYTES
RBC.: 3.97 MIL/uL (ref 3.87–5.11)
RETIC COUNT ABSOLUTE: 47.6 10*3/uL (ref 19.0–186.0)
Retic Ct Pct: 1.2 % (ref 0.4–3.1)

## 2014-11-14 LAB — FERRITIN: Ferritin: 14 ng/mL (ref 11–307)

## 2014-11-14 LAB — VITAMIN B12: Vitamin B-12: 262 pg/mL (ref 180–914)

## 2014-11-14 MED ORDER — CYCLOPENTOLATE-PHENYLEPHRINE OP SOLN OPTIME - NO CHARGE
OPHTHALMIC | Status: AC
  Filled 2014-11-14: qty 2

## 2014-11-14 MED ORDER — NEOMYCIN-POLYMYXIN-DEXAMETH 3.5-10000-0.1 OP SUSP
OPHTHALMIC | Status: AC
  Filled 2014-11-14: qty 5

## 2014-11-14 MED ORDER — PHENYLEPHRINE HCL 2.5 % OP SOLN
OPHTHALMIC | Status: AC
  Filled 2014-11-14: qty 15

## 2014-11-14 MED ORDER — TETRACAINE HCL 0.5 % OP SOLN
OPHTHALMIC | Status: AC
  Filled 2014-11-14: qty 2

## 2014-11-14 MED ORDER — LIDOCAINE HCL (PF) 1 % IJ SOLN
INTRAMUSCULAR | Status: AC
  Filled 2014-11-14: qty 2

## 2014-11-14 MED ORDER — LIDOCAINE HCL 3.5 % OP GEL
OPHTHALMIC | Status: AC
  Filled 2014-11-14: qty 1

## 2014-11-14 NOTE — Progress Notes (Signed)
..  [name]'s reason for visit today is for labs as scheduled per MD orders.  Venipuncture performed with a 23 gauge butterfly needle to R Antecubital.  [name] tolerated procedure well and without incident; questions were answered and patient was discharged.

## 2014-11-15 LAB — HAPTOGLOBIN: Haptoglobin: 255 mg/dL — ABNORMAL HIGH (ref 34–200)

## 2014-11-16 ENCOUNTER — Other Ambulatory Visit (HOSPITAL_COMMUNITY): Payer: Self-pay | Admitting: Oncology

## 2014-11-16 DIAGNOSIS — D509 Iron deficiency anemia, unspecified: Secondary | ICD-10-CM

## 2014-11-16 DIAGNOSIS — E538 Deficiency of other specified B group vitamins: Secondary | ICD-10-CM

## 2014-11-17 ENCOUNTER — Other Ambulatory Visit (HOSPITAL_COMMUNITY): Payer: Self-pay

## 2014-11-17 ENCOUNTER — Ambulatory Visit (HOSPITAL_COMMUNITY): Payer: Medicare Other | Admitting: Anesthesiology

## 2014-11-17 ENCOUNTER — Encounter (HOSPITAL_COMMUNITY)
Admission: RE | Disposition: A | Discharge status: Home / Self Care | Payer: Self-pay | Source: Ambulatory Visit | Attending: Ophthalmology

## 2014-11-17 ENCOUNTER — Encounter (HOSPITAL_COMMUNITY): Payer: Self-pay | Admitting: Ophthalmology

## 2014-11-17 ENCOUNTER — Ambulatory Visit (HOSPITAL_COMMUNITY)
Admission: RE | Admit: 2014-11-17 | Discharge: 2014-11-17 | Disposition: A | Discharge status: Home / Self Care | Payer: Medicare Other | Source: Ambulatory Visit | Attending: Ophthalmology | Admitting: Ophthalmology

## 2014-11-17 DIAGNOSIS — I10 Essential (primary) hypertension: Secondary | ICD-10-CM | POA: Insufficient documentation

## 2014-11-17 DIAGNOSIS — Z79899 Other long term (current) drug therapy: Secondary | ICD-10-CM | POA: Insufficient documentation

## 2014-11-17 DIAGNOSIS — H25812 Combined forms of age-related cataract, left eye: Secondary | ICD-10-CM | POA: Diagnosis present

## 2014-11-17 DIAGNOSIS — K219 Gastro-esophageal reflux disease without esophagitis: Secondary | ICD-10-CM | POA: Diagnosis not present

## 2014-11-17 DIAGNOSIS — Z7951 Long term (current) use of inhaled steroids: Secondary | ICD-10-CM | POA: Insufficient documentation

## 2014-11-17 DIAGNOSIS — G4730 Sleep apnea, unspecified: Secondary | ICD-10-CM | POA: Insufficient documentation

## 2014-11-17 DIAGNOSIS — Z86718 Personal history of other venous thrombosis and embolism: Secondary | ICD-10-CM | POA: Diagnosis not present

## 2014-11-17 DIAGNOSIS — Z7901 Long term (current) use of anticoagulants: Secondary | ICD-10-CM | POA: Insufficient documentation

## 2014-11-17 DIAGNOSIS — F419 Anxiety disorder, unspecified: Secondary | ICD-10-CM | POA: Insufficient documentation

## 2014-11-17 DIAGNOSIS — E538 Deficiency of other specified B group vitamins: Secondary | ICD-10-CM

## 2014-11-17 DIAGNOSIS — D509 Iron deficiency anemia, unspecified: Secondary | ICD-10-CM

## 2014-11-17 HISTORY — PX: CATARACT EXTRACTION W/PHACO: SHX586

## 2014-11-17 SURGERY — PHACOEMULSIFICATION, CATARACT, WITH IOL INSERTION
Anesthesia: Monitor Anesthesia Care | Site: Eye | Laterality: Left

## 2014-11-17 MED ORDER — ONDANSETRON HCL 4 MG/2ML IJ SOLN: 4.0000 mg | Freq: Once | INTRAMUSCULAR | Status: DC | PRN

## 2014-11-17 MED ORDER — SCOPOLAMINE 1 MG/3DAYS TD PT72
MEDICATED_PATCH | TRANSDERMAL | Status: AC
  Filled 2014-11-17: qty 1

## 2014-11-17 MED ORDER — CYANOCOBALAMIN 1000 MCG/ML IJ SOLN
1000.0000 ug | Freq: Once | INTRAMUSCULAR | Status: DC
  Filled 2014-11-17: qty 1

## 2014-11-17 MED ORDER — LIDOCAINE HCL 3.5 % OP GEL
1.0000 "application " | Freq: Once | OPHTHALMIC | Status: AC
  Administered 2014-11-17: 1 via OPHTHALMIC

## 2014-11-17 MED ORDER — SCOPOLAMINE 1 MG/3DAYS TD PT72
1.0000 | MEDICATED_PATCH | Freq: Once | TRANSDERMAL | Status: DC
  Administered 2014-11-17: 1.5 mg via TRANSDERMAL

## 2014-11-17 MED ORDER — EPINEPHRINE HCL 1 MG/ML IJ SOLN
INTRAMUSCULAR | Status: AC
  Filled 2014-11-17: qty 1

## 2014-11-17 MED ORDER — BSS IO SOLN
INTRAOCULAR | Status: DC | PRN
  Administered 2014-11-17: 15 mL via INTRAOCULAR

## 2014-11-17 MED ORDER — CYANOCOBALAMIN 1000 MCG/ML IJ SOLN: 1000.0000 ug | Freq: Once | INTRAMUSCULAR | Status: DC

## 2014-11-17 MED ORDER — LACTATED RINGERS IV SOLN
INTRAVENOUS | Status: DC
  Administered 2014-11-17: 75 mL/h via INTRAVENOUS

## 2014-11-17 MED ORDER — FENTANYL CITRATE (PF) 100 MCG/2ML IJ SOLN: 25.0000 ug | INTRAMUSCULAR | Status: DC | PRN

## 2014-11-17 MED ORDER — SODIUM CHLORIDE 0.9 % IV SOLN
510.0000 mg | Freq: Once | INTRAVENOUS | Status: DC
  Filled 2014-11-17: qty 17

## 2014-11-17 MED ORDER — LIDOCAINE HCL (PF) 1 % IJ SOLN
INTRAMUSCULAR | Status: DC | PRN
  Administered 2014-11-17: .4 mL

## 2014-11-17 MED ORDER — NEOMYCIN-POLYMYXIN-DEXAMETH 3.5-10000-0.1 OP SUSP
OPHTHALMIC | Status: DC | PRN
  Administered 2014-11-17: 2 [drp] via OPHTHALMIC

## 2014-11-17 MED ORDER — CYCLOPENTOLATE-PHENYLEPHRINE 0.2-1 % OP SOLN
1.0000 [drp] | OPHTHALMIC | Status: AC
Start: 2014-11-17 — End: 2014-11-17
  Administered 2014-11-17 (×3): 1 [drp] via OPHTHALMIC

## 2014-11-17 MED ORDER — EPINEPHRINE HCL 1 MG/ML IJ SOLN
INTRAOCULAR | Status: DC | PRN
  Administered 2014-11-17: 11:00:00

## 2014-11-17 MED ORDER — PROVISC 10 MG/ML IO SOLN
INTRAOCULAR | Status: DC | PRN
  Administered 2014-11-17: 0.85 mL via INTRAOCULAR

## 2014-11-17 MED ORDER — POVIDONE-IODINE 5 % OP SOLN
OPHTHALMIC | Status: DC | PRN
  Administered 2014-11-17: 1 via OPHTHALMIC

## 2014-11-17 MED ORDER — MIDAZOLAM HCL 2 MG/2ML IJ SOLN
1.0000 mg | INTRAMUSCULAR | Status: DC | PRN
Start: 2014-11-17 — End: 2014-11-17
  Administered 2014-11-17: 2 mg via INTRAVENOUS

## 2014-11-17 MED ORDER — TETRACAINE HCL 0.5 % OP SOLN
1.0000 [drp] | OPHTHALMIC | Status: AC
Start: 2014-11-17 — End: 2014-11-17
  Administered 2014-11-17 (×3): 1 [drp] via OPHTHALMIC

## 2014-11-17 MED ORDER — PHENYLEPHRINE HCL 2.5 % OP SOLN
1.0000 [drp] | OPHTHALMIC | Status: AC
  Administered 2014-11-17 (×3): 1 [drp] via OPHTHALMIC

## 2014-11-17 MED ORDER — MIDAZOLAM HCL 2 MG/2ML IJ SOLN
INTRAMUSCULAR | Status: AC
  Filled 2014-11-17: qty 2

## 2014-11-17 SURGICAL SUPPLY — 33 items
CAPSULAR TENSION RING-AMO (OPHTHALMIC RELATED) IMPLANT
CLOTH BEACON ORANGE TIMEOUT ST (SAFETY) ×2 IMPLANT
EYE SHIELD UNIVERSAL CLEAR (GAUZE/BANDAGES/DRESSINGS) ×2 IMPLANT
GLOVE BIO SURGEON STRL SZ 6.5 (GLOVE) IMPLANT
GLOVE BIOGEL PI IND STRL 6.5 (GLOVE) IMPLANT
GLOVE BIOGEL PI IND STRL 7.0 (GLOVE) ×3 IMPLANT
GLOVE BIOGEL PI IND STRL 7.5 (GLOVE) IMPLANT
GLOVE BIOGEL PI INDICATOR 6.5 (GLOVE)
GLOVE BIOGEL PI INDICATOR 7.0 (GLOVE) ×3
GLOVE BIOGEL PI INDICATOR 7.5 (GLOVE)
GLOVE ECLIPSE 6.5 STRL STRAW (GLOVE) IMPLANT
GLOVE ECLIPSE 7.0 STRL STRAW (GLOVE) IMPLANT
GLOVE ECLIPSE 7.5 STRL STRAW (GLOVE) IMPLANT
GLOVE EXAM NITRILE LRG STRL (GLOVE) IMPLANT
GLOVE EXAM NITRILE MD LF STRL (GLOVE) IMPLANT
GLOVE SKINSENSE NS SZ6.5 (GLOVE)
GLOVE SKINSENSE NS SZ7.0 (GLOVE)
GLOVE SKINSENSE STRL SZ6.5 (GLOVE) IMPLANT
GLOVE SKINSENSE STRL SZ7.0 (GLOVE) IMPLANT
KIT VITRECTOMY (OPHTHALMIC RELATED) IMPLANT
PAD ARMBOARD 7.5X6 YLW CONV (MISCELLANEOUS) ×2 IMPLANT
PROC W NO LENS (INTRAOCULAR LENS)
PROC W SPEC LENS (INTRAOCULAR LENS)
PROCESS W NO LENS (INTRAOCULAR LENS) IMPLANT
PROCESS W SPEC LENS (INTRAOCULAR LENS) IMPLANT
RETRACTOR IRIS SIGHTPATH (OPHTHALMIC RELATED) IMPLANT
RING MALYGIN (MISCELLANEOUS) IMPLANT
SIGHTPATH CAT PROC W REG LENS (Ophthalmic Related) ×2 IMPLANT
SYRINGE LUER LOK 1CC (MISCELLANEOUS) ×2 IMPLANT
TAPE SURG TRANSPARENT 2IN (GAUZE/BANDAGES/DRESSINGS) ×1 IMPLANT
TAPE TRANSPARENT 2IN (GAUZE/BANDAGES/DRESSINGS) ×1
VISCOELASTIC ADDITIONAL (OPHTHALMIC RELATED) IMPLANT
WATER STERILE IRR 250ML POUR (IV SOLUTION) ×2 IMPLANT

## 2014-11-17 NOTE — Anesthesia Postprocedure Evaluation (Signed)
  Anesthesia Post-op Note  Patient: [name]  Procedure(s) Performed: Procedure(s) with comments: CATARACT EXTRACTION PHACO AND INTRAOCULAR LENS PLACEMENT LEFT EYE (Left) - CDE:5.60  Patient Location: Short Stay  Anesthesia Type:MAC  Level of Consciousness: awake, alert , oriented and patient cooperative  Airway and Oxygen Therapy: Patient Spontanous Breathing  Post-op Pain: none  Post-op Assessment: Post-op Vital signs reviewed, Patient's Cardiovascular Status Stable, Respiratory Function Stable, Patent Airway, No signs of Nausea or vomiting and Pain level controlled              Post-op Vital Signs: Reviewed and stable  Last Vitals:  Filed Vitals:   11/17/14 1109  BP:   Pulse:   Temp: 36.8 C  Resp:     Complications: No apparent anesthesia complications

## 2014-11-17 NOTE — Op Note (Signed)
Date of Admission: 11/17/2014  Date of Surgery: 11/17/2014   Pre-Op Dx: Cataract Left Eye  Post-Op Dx: Senile Combined Cataract Left  Eye,  Dx Code [id number]  Surgeon: Tonny Branch, M.D.  Assistants: None  Anesthesia: Topical with MAC  Indications: Painless, progressive loss of vision with compromise of daily activities.  Surgery: Cataract Extraction with Intraocular lens Implant Left Eye  Discription: The patient had dilating drops and viscous lidocaine placed into the Left eye in the pre-op holding area. After transfer to the operating room, a time out was performed. The patient was then prepped and draped. Beginning with a 53 degree blade a paracentesis port was made at the surgeon's 2 o'clock position. The anterior chamber was then filled with 1% non-preserved lidocaine. This was followed by filling the anterior chamber with Provisc.  A 2.48mm keratome blade was used to make a clear corneal incision at the temporal limbus.  A bent cystatome needle was used to create a continuous tear capsulotomy. Hydrodissection was performed with balanced salt solution on a Fine canula. The lens nucleus was then removed using the phacoemulsification handpiece. Residual cortex was removed with the I&A handpiece. The anterior chamber and capsular bag were refilled with Provisc. A posterior chamber intraocular lens was placed into the capsular bag with it's injector. The implant was positioned with the Kuglan hook. The Provisc was then removed from the anterior chamber and capsular bag with the I&A handpiece. Stromal hydration of the main incision and paracentesis port was performed with BSS on a Fine canula. The wounds were tested for leak which was negative. The patient tolerated the procedure well. There were no operative complications. The patient was then transferred to the recovery room in stable condition.  Complications: None  Specimen: None  EBL: None  Prosthetic device: Hoya iSert 250, power 19.0 D,  SN [id number].

## 2014-11-17 NOTE — OR Nursing (Signed)
Disregard vitamin b12 order per Ofilia Neas  RN in cancer center

## 2014-11-17 NOTE — Anesthesia Preprocedure Evaluation (Addendum)
Anesthesia Evaluation  Patient identified by MRN, date of birth, ID band Patient awake    Reviewed: Allergy & Precautions, NPO status , Patient's Chart, lab work & pertinent test results  History of Anesthesia Complications (+) PONV  Airway Mallampati: II  TM Distance: >3 FB Neck ROM: Full    Dental  (+) Teeth Intact   Pulmonary asthma , sleep apnea , pneumonia, resolved,     + decreased breath sounds      Cardiovascular hypertension, + Past MI  Normal cardiovascular exam     Neuro/Psych Anxiety    GI/Hepatic GERD  ,  Endo/Other    Renal/GU      Musculoskeletal   Abdominal Normal abdominal exam  (+)   Peds  Hematology  (+) anemia ,   Anesthesia Other Findings   Reproductive/Obstetrics                             Anesthesia Physical Anesthesia Plan  ASA: III  Anesthesia Plan: MAC   Post-op Pain Management:    Induction: Intravenous  Airway Management Planned: Nasal Cannula  Additional Equipment:   Intra-op Plan:   Post-operative Plan:   Informed Consent: I have reviewed the patients History and Physical, chart, labs and discussed the procedure including the risks, benefits and alternatives for the proposed anesthesia with the patient or authorized representative who has indicated his/her understanding and acceptance.     Plan Discussed with: CRNA  Anesthesia Plan Comments:         Anesthesia Quick Evaluation

## 2014-11-17 NOTE — H&P (Signed)
I have reviewed the H&P, the patient was re-examined, and I have identified no interval changes in medical condition and plan of care since the history and physical of record  

## 2014-11-17 NOTE — Discharge Instructions (Signed)

## 2014-11-17 NOTE — Transfer of Care (Signed)
Immediate Anesthesia Transfer of Care Note  Patient: [name]  Procedure(s) Performed: Procedure(s) with comments: CATARACT EXTRACTION PHACO AND INTRAOCULAR LENS PLACEMENT LEFT EYE (Left) - CDE:5.60  Patient Location: Short Stay  Anesthesia Type:MAC  Level of Consciousness: awake, alert , oriented and patient cooperative  Airway & Oxygen Therapy: Patient Spontanous Breathing  Post-op Assessment: Report given to RN, Post -op Vital signs reviewed and stable and Patient moving all extremities  Post vital signs: Reviewed and stable  Last Vitals:  Filed Vitals:   11/17/14 1050  BP: 140/72  Pulse: 78  Temp:   Resp: 18    Complications: No apparent anesthesia complications

## 2014-11-18 ENCOUNTER — Encounter (HOSPITAL_COMMUNITY): Payer: Self-pay | Admitting: Ophthalmology

## 2014-11-19 ENCOUNTER — Other Ambulatory Visit (HOSPITAL_COMMUNITY): Payer: Self-pay | Admitting: *Deleted

## 2014-11-19 ENCOUNTER — Other Ambulatory Visit (HOSPITAL_COMMUNITY): Payer: Self-pay | Admitting: Oncology

## 2014-11-19 ENCOUNTER — Encounter (HOSPITAL_COMMUNITY): Payer: Medicare Other

## 2014-11-19 DIAGNOSIS — D509 Iron deficiency anemia, unspecified: Secondary | ICD-10-CM

## 2014-11-19 DIAGNOSIS — E538 Deficiency of other specified B group vitamins: Secondary | ICD-10-CM

## 2014-11-20 ENCOUNTER — Encounter (HOSPITAL_BASED_OUTPATIENT_CLINIC_OR_DEPARTMENT_OTHER): Payer: Medicare Other

## 2014-11-20 ENCOUNTER — Encounter (HOSPITAL_COMMUNITY): Payer: Medicare Other

## 2014-11-20 VITALS — BP 141/87 | HR 53 | Temp 98.0°F | Resp 18

## 2014-11-20 DIAGNOSIS — E538 Deficiency of other specified B group vitamins: Secondary | ICD-10-CM

## 2014-11-20 DIAGNOSIS — E611 Iron deficiency: Secondary | ICD-10-CM | POA: Diagnosis not present

## 2014-11-20 DIAGNOSIS — D801 Nonfamilial hypogammaglobulinemia: Secondary | ICD-10-CM

## 2014-11-20 DIAGNOSIS — D649 Anemia, unspecified: Secondary | ICD-10-CM | POA: Diagnosis not present

## 2014-11-20 DIAGNOSIS — D800 Hereditary hypogammaglobulinemia: Secondary | ICD-10-CM | POA: Diagnosis not present

## 2014-11-20 DIAGNOSIS — D509 Iron deficiency anemia, unspecified: Secondary | ICD-10-CM

## 2014-11-20 MED ORDER — SODIUM CHLORIDE 0.9 % IJ SOLN: 10.0000 mL | INTRAMUSCULAR | Status: DC | PRN

## 2014-11-20 MED ORDER — CYANOCOBALAMIN 1000 MCG/ML IJ SOLN
INTRAMUSCULAR | Status: AC
  Filled 2014-11-20: qty 1

## 2014-11-20 MED ORDER — IMMUNE GLOBULIN (HUMAN) 10 GM/100ML IV SOLN
50.0000 g | Freq: Once | INTRAVENOUS | Status: AC
  Administered 2014-11-20: 50 g via INTRAVENOUS
  Filled 2014-11-20: qty 500

## 2014-11-20 MED ORDER — CYANOCOBALAMIN 1000 MCG/ML IJ SOLN: 1000.0000 ug | Freq: Once | INTRAMUSCULAR | Status: DC

## 2014-11-20 MED ORDER — ACETAMINOPHEN 325 MG PO TABS: 650.0000 mg | ORAL_TABLET | Freq: Four times a day (QID) | ORAL | Status: DC | PRN

## 2014-11-20 MED ORDER — DIPHENHYDRAMINE HCL 25 MG PO CAPS: 25.0000 mg | ORAL_CAPSULE | Freq: Once | ORAL | Status: DC

## 2014-11-20 MED ORDER — CYANOCOBALAMIN 1000 MCG/ML IJ SOLN
1000.0000 ug | Freq: Once | INTRAMUSCULAR | Status: AC
  Administered 2014-11-20: 1000 ug via INTRAMUSCULAR

## 2014-11-20 MED ORDER — SODIUM CHLORIDE 0.9 % IV SOLN
510.0000 mg | Freq: Once | INTRAVENOUS | Status: AC
  Administered 2014-11-20: 510 mg via INTRAVENOUS
  Filled 2014-11-20: qty 17

## 2014-11-20 MED ORDER — SODIUM CHLORIDE 0.9 % IV SOLN
Freq: Once | INTRAVENOUS | Status: AC
  Administered 2014-11-20: 09:00:00 via INTRAVENOUS

## 2014-11-20 NOTE — Progress Notes (Signed)
Please see other encounter for documentation.   

## 2014-11-20 NOTE — Progress Notes (Signed)
[name] presents today for injection per MD orders. B12 1019mcg administered IM in left Upper Arm. Administration without incident. Patient tolerated well.  Patient also received IVIG as well as Feraheme.  She tolerated infusions well.  VSS during and after all infusions.  New schedule was given to the patient for follow up and changes to supportive therapy.  Patient verbalized understanding.

## 2014-11-21 LAB — ANTI-PARIETAL ANTIBODY: PARIETAL CELL ANTIBODY-IGG: 4.4 U (ref 0.0–20.0)

## 2014-11-21 LAB — INTRINSIC FACTOR ANTIBODIES: INTRINSIC FACTOR: 0.9 [AU]/ml (ref 0.0–1.1)

## 2014-11-21 NOTE — Progress Notes (Signed)
Labs drawn

## 2014-11-25 ENCOUNTER — Encounter (HOSPITAL_BASED_OUTPATIENT_CLINIC_OR_DEPARTMENT_OTHER): Payer: Medicare Other

## 2014-11-25 ENCOUNTER — Ambulatory Visit (HOSPITAL_COMMUNITY): Payer: Medicare Other

## 2014-11-25 VITALS — BP 122/70 | HR 86 | Temp 98.0°F | Resp 18

## 2014-11-25 DIAGNOSIS — E538 Deficiency of other specified B group vitamins: Secondary | ICD-10-CM | POA: Diagnosis not present

## 2014-11-25 MED ORDER — CYANOCOBALAMIN 1000 MCG/ML IJ SOLN
1000.0000 ug | Freq: Once | INTRAMUSCULAR | Status: AC
  Administered 2014-11-25: 1000 ug via INTRAMUSCULAR

## 2014-11-25 NOTE — Progress Notes (Signed)
[name] presents today for injection per MD orders. B12 1045mcg administered IM in right Upper Arm. Administration without incident. Patient tolerated well.

## 2014-11-25 NOTE — Patient Instructions (Signed)
[place] at [facility] Discharge Instructions  RECOMMENDATIONS MADE BY THE CONSULTANT AND ANY TEST RESULTS WILL BE SENT TO YOUR REFERRING PHYSICIAN.  B12 today.  You will get two more weekly B12 injections.  Please see your schedule for appointments.    Thank you for choosing [place] at [facility] to provide your oncology and hematology care.  To afford each patient quality time with our provider, please arrive at least 15 minutes before your scheduled appointment time.    You need to re-schedule your appointment should you arrive 10 or more minutes late.  We strive to give you quality time with our providers, and arriving late affects you and other patients whose appointments are after yours.  Also, if you no show three or more times for appointments you may be dismissed from the clinic at the providers discretion.     Again, thank you for choosing [facility].  Our hope is that these requests will decrease the amount of time that you wait before being seen by our physicians.       _____________________________________________________________  Should you have questions after your visit to [facility], please contact our office at [phone] between the hours of 8:30 a.m. and 4:30 p.m.  Voicemails left after 4:30 p.m. will not be returned until the following business day.  For prescription refill requests, have your pharmacy contact our office.

## 2014-11-26 ENCOUNTER — Ambulatory Visit (HOSPITAL_COMMUNITY): Payer: Medicare Other

## 2014-11-26 ENCOUNTER — Ambulatory Visit (HOSPITAL_COMMUNITY)
Admission: RE | Admit: 2014-11-26 | Discharge: 2014-11-26 | Disposition: A | Discharge status: Home / Self Care | Payer: Medicare Other | Source: Ambulatory Visit | Attending: Hematology & Oncology | Admitting: Hematology & Oncology

## 2014-11-26 DIAGNOSIS — J479 Bronchiectasis, uncomplicated: Secondary | ICD-10-CM | POA: Insufficient documentation

## 2014-11-26 DIAGNOSIS — R93422 Abnormal radiologic findings on diagnostic imaging of left kidney: Secondary | ICD-10-CM | POA: Diagnosis not present

## 2014-11-26 DIAGNOSIS — Z8572 Personal history of non-Hodgkin lymphomas: Secondary | ICD-10-CM | POA: Diagnosis not present

## 2014-11-26 DIAGNOSIS — N189 Chronic kidney disease, unspecified: Secondary | ICD-10-CM | POA: Insufficient documentation

## 2014-11-26 DIAGNOSIS — R109 Unspecified abdominal pain: Secondary | ICD-10-CM | POA: Diagnosis present

## 2014-11-26 DIAGNOSIS — C8338 Diffuse large B-cell lymphoma, lymph nodes of multiple sites: Secondary | ICD-10-CM

## 2014-11-26 MED ORDER — IOHEXOL 300 MG/ML  SOLN
150.0000 mL | Freq: Once | INTRAMUSCULAR | Status: AC | PRN
  Administered 2014-11-26: 100 mL via INTRAVENOUS

## 2014-12-03 ENCOUNTER — Ambulatory Visit (HOSPITAL_COMMUNITY): Payer: Medicare Other

## 2014-12-11 ENCOUNTER — Encounter (HOSPITAL_COMMUNITY): Payer: Medicare Other | Attending: Hematology and Oncology

## 2014-12-11 ENCOUNTER — Encounter (HOSPITAL_COMMUNITY): Payer: Self-pay

## 2014-12-11 ENCOUNTER — Encounter (HOSPITAL_COMMUNITY)
Admission: RE | Admit: 2014-12-11 | Discharge: 2014-12-11 | Disposition: A | Discharge status: Home / Self Care | Payer: Medicare Other | Source: Ambulatory Visit | Attending: Ophthalmology | Admitting: Ophthalmology

## 2014-12-11 VITALS — BP 126/75 | HR 74 | Temp 98.0°F | Resp 18

## 2014-12-11 DIAGNOSIS — D800 Hereditary hypogammaglobulinemia: Secondary | ICD-10-CM | POA: Insufficient documentation

## 2014-12-11 DIAGNOSIS — E538 Deficiency of other specified B group vitamins: Secondary | ICD-10-CM | POA: Diagnosis not present

## 2014-12-11 DIAGNOSIS — D869 Sarcoidosis, unspecified: Secondary | ICD-10-CM | POA: Insufficient documentation

## 2014-12-11 DIAGNOSIS — D801 Nonfamilial hypogammaglobulinemia: Secondary | ICD-10-CM | POA: Insufficient documentation

## 2014-12-11 DIAGNOSIS — C8590 Non-Hodgkin lymphoma, unspecified, unspecified site: Secondary | ICD-10-CM | POA: Insufficient documentation

## 2014-12-11 MED ORDER — CYANOCOBALAMIN 1000 MCG/ML IJ SOLN
1000.0000 ug | Freq: Once | INTRAMUSCULAR | Status: AC
  Administered 2014-12-11: 1000 ug via INTRAMUSCULAR

## 2014-12-11 MED ORDER — CYANOCOBALAMIN 1000 MCG/ML IJ SOLN
INTRAMUSCULAR | Status: AC
  Filled 2014-12-11: qty 1

## 2014-12-11 NOTE — Progress Notes (Signed)
Per patient she had a dose of vitamin B12 sent to her pharmacy and her daughter administered week 3 of 4 to her last week at home. [name] presents today for injection per MD orders. B12 1000 mcg administered IM in right Upper Arm. Administration without incident. Patient tolerated well.

## 2014-12-11 NOTE — Patient Instructions (Signed)
[place] at [facility] Discharge Instructions  RECOMMENDATIONS MADE BY THE CONSULTANT AND ANY TEST RESULTS WILL BE SENT TO YOUR REFERRING PHYSICIAN.  Vitamin B12 1000 mcg injection (week 4 of 4) given today as ordered. Return as scheduled.  Thank you for choosing [place] at [facility] to provide your oncology and hematology care.  To afford each patient quality time with our provider, please arrive at least 15 minutes before your scheduled appointment time.    You need to re-schedule your appointment should you arrive 10 or more minutes late.  We strive to give you quality time with our providers, and arriving late affects you and other patients whose appointments are after yours.  Also, if you no show three or more times for appointments you may be dismissed from the clinic at the providers discretion.     Again, thank you for choosing [facility].  Our hope is that these requests will decrease the amount of time that you wait before being seen by our physicians.       _____________________________________________________________  Should you have questions after your visit to [facility], please contact our office at [phone] between the hours of 8:30 a.m. and 4:30 p.m.  Voicemails left after 4:30 p.m. will not be returned until the following business day.  For prescription refill requests, have your pharmacy contact our office.

## 2014-12-12 MED ORDER — LIDOCAINE HCL 3.5 % OP GEL
OPHTHALMIC | Status: AC
  Filled 2014-12-12: qty 1

## 2014-12-12 MED ORDER — LIDOCAINE HCL (PF) 1 % IJ SOLN
INTRAMUSCULAR | Status: AC
  Filled 2014-12-12: qty 2

## 2014-12-12 MED ORDER — TETRACAINE HCL 0.5 % OP SOLN
OPHTHALMIC | Status: AC
  Filled 2014-12-12: qty 4

## 2014-12-12 MED ORDER — CYCLOPENTOLATE-PHENYLEPHRINE OP SOLN OPTIME - NO CHARGE
OPHTHALMIC | Status: AC
  Filled 2014-12-12: qty 2

## 2014-12-12 MED ORDER — NEOMYCIN-POLYMYXIN-DEXAMETH 3.5-10000-0.1 OP SUSP
OPHTHALMIC | Status: AC
  Filled 2014-12-12: qty 5

## 2014-12-12 MED ORDER — PHENYLEPHRINE HCL 2.5 % OP SOLN
OPHTHALMIC | Status: AC
  Filled 2014-12-12: qty 15

## 2014-12-15 ENCOUNTER — Ambulatory Visit (HOSPITAL_COMMUNITY): Payer: Medicare Other | Admitting: Anesthesiology

## 2014-12-15 ENCOUNTER — Encounter (HOSPITAL_COMMUNITY): Payer: Self-pay | Admitting: *Deleted

## 2014-12-15 ENCOUNTER — Ambulatory Visit (HOSPITAL_COMMUNITY)
Admission: RE | Admit: 2014-12-15 | Discharge: 2014-12-15 | Disposition: A | Discharge status: Home / Self Care | Payer: Medicare Other | Source: Ambulatory Visit | Attending: Ophthalmology | Admitting: Ophthalmology

## 2014-12-15 ENCOUNTER — Encounter (HOSPITAL_COMMUNITY)
Admission: RE | Disposition: A | Discharge status: Home / Self Care | Payer: Self-pay | Source: Ambulatory Visit | Attending: Ophthalmology

## 2014-12-15 DIAGNOSIS — I10 Essential (primary) hypertension: Secondary | ICD-10-CM | POA: Insufficient documentation

## 2014-12-15 DIAGNOSIS — G4730 Sleep apnea, unspecified: Secondary | ICD-10-CM | POA: Diagnosis not present

## 2014-12-15 DIAGNOSIS — K219 Gastro-esophageal reflux disease without esophagitis: Secondary | ICD-10-CM | POA: Insufficient documentation

## 2014-12-15 DIAGNOSIS — Z01812 Encounter for preprocedural laboratory examination: Secondary | ICD-10-CM | POA: Diagnosis not present

## 2014-12-15 DIAGNOSIS — Z79899 Other long term (current) drug therapy: Secondary | ICD-10-CM | POA: Diagnosis not present

## 2014-12-15 DIAGNOSIS — F329 Major depressive disorder, single episode, unspecified: Secondary | ICD-10-CM | POA: Diagnosis not present

## 2014-12-15 DIAGNOSIS — H25811 Combined forms of age-related cataract, right eye: Secondary | ICD-10-CM | POA: Diagnosis not present

## 2014-12-15 HISTORY — PX: CATARACT EXTRACTION W/PHACO: SHX586

## 2014-12-15 SURGERY — PHACOEMULSIFICATION, CATARACT, WITH IOL INSERTION
Anesthesia: Monitor Anesthesia Care | Site: Eye | Laterality: Right

## 2014-12-15 MED ORDER — TETRACAINE HCL 0.5 % OP SOLN
1.0000 [drp] | OPHTHALMIC | Status: AC
  Administered 2014-12-15 (×3): 1 [drp] via OPHTHALMIC

## 2014-12-15 MED ORDER — LACTATED RINGERS IV SOLN
INTRAVENOUS | Status: DC
  Administered 2014-12-15: 1000 mL via INTRAVENOUS

## 2014-12-15 MED ORDER — FENTANYL CITRATE (PF) 100 MCG/2ML IJ SOLN
INTRAMUSCULAR | Status: AC
  Filled 2014-12-15: qty 2

## 2014-12-15 MED ORDER — LIDOCAINE HCL 3.5 % OP GEL
1.0000 "application " | Freq: Once | OPHTHALMIC | Status: AC
  Administered 2014-12-15: 1 via OPHTHALMIC

## 2014-12-15 MED ORDER — EPINEPHRINE HCL 1 MG/ML IJ SOLN
INTRAMUSCULAR | Status: AC
  Filled 2014-12-15: qty 1

## 2014-12-15 MED ORDER — MIDAZOLAM HCL 2 MG/2ML IJ SOLN
INTRAMUSCULAR | Status: AC
  Filled 2014-12-15: qty 2

## 2014-12-15 MED ORDER — POVIDONE-IODINE 5 % OP SOLN
OPHTHALMIC | Status: DC | PRN
  Administered 2014-12-15: 1 via OPHTHALMIC

## 2014-12-15 MED ORDER — PHENYLEPHRINE HCL 2.5 % OP SOLN
1.0000 [drp] | OPHTHALMIC | Status: AC
  Administered 2014-12-15 (×3): 1 [drp] via OPHTHALMIC

## 2014-12-15 MED ORDER — PROVISC 10 MG/ML IO SOLN
INTRAOCULAR | Status: DC | PRN
  Administered 2014-12-15: 0.85 mL via INTRAOCULAR

## 2014-12-15 MED ORDER — FENTANYL CITRATE (PF) 100 MCG/2ML IJ SOLN
25.0000 ug | INTRAMUSCULAR | Status: AC
  Administered 2014-12-15 (×2): 25 ug via INTRAVENOUS

## 2014-12-15 MED ORDER — LIDOCAINE HCL (PF) 1 % IJ SOLN
INTRAMUSCULAR | Status: DC | PRN
  Administered 2014-12-15: .6 mL

## 2014-12-15 MED ORDER — MIDAZOLAM HCL 2 MG/2ML IJ SOLN
1.0000 mg | INTRAMUSCULAR | Status: DC | PRN
  Administered 2014-12-15: 2 mg via INTRAVENOUS

## 2014-12-15 MED ORDER — EPINEPHRINE HCL 1 MG/ML IJ SOLN
INTRAOCULAR | Status: DC | PRN
  Administered 2014-12-15: 500 mL

## 2014-12-15 MED ORDER — CYCLOPENTOLATE-PHENYLEPHRINE 0.2-1 % OP SOLN
1.0000 [drp] | OPHTHALMIC | Status: AC
  Administered 2014-12-15 (×3): 1 [drp] via OPHTHALMIC

## 2014-12-15 MED ORDER — NEOMYCIN-POLYMYXIN-DEXAMETH 3.5-10000-0.1 OP SUSP
OPHTHALMIC | Status: DC | PRN
  Administered 2014-12-15: 1 [drp] via OPHTHALMIC

## 2014-12-15 MED ORDER — BSS IO SOLN
INTRAOCULAR | Status: DC | PRN
  Administered 2014-12-15: 15 mL via INTRAOCULAR

## 2014-12-15 SURGICAL SUPPLY — 33 items
CAPSULAR TENSION RING-AMO (OPHTHALMIC RELATED) IMPLANT
CLOTH BEACON ORANGE TIMEOUT ST (SAFETY) ×2 IMPLANT
EYE SHIELD UNIVERSAL CLEAR (GAUZE/BANDAGES/DRESSINGS) ×2 IMPLANT
GLOVE BIO SURGEON STRL SZ 6.5 (GLOVE) IMPLANT
GLOVE BIOGEL PI IND STRL 6.5 (GLOVE) IMPLANT
GLOVE BIOGEL PI IND STRL 7.0 (GLOVE) ×2 IMPLANT
GLOVE BIOGEL PI IND STRL 7.5 (GLOVE) IMPLANT
GLOVE BIOGEL PI INDICATOR 6.5 (GLOVE)
GLOVE BIOGEL PI INDICATOR 7.0 (GLOVE) ×2
GLOVE BIOGEL PI INDICATOR 7.5 (GLOVE)
GLOVE ECLIPSE 6.5 STRL STRAW (GLOVE) IMPLANT
GLOVE ECLIPSE 7.0 STRL STRAW (GLOVE) IMPLANT
GLOVE ECLIPSE 7.5 STRL STRAW (GLOVE) IMPLANT
GLOVE EXAM NITRILE LRG STRL (GLOVE) IMPLANT
GLOVE EXAM NITRILE MD LF STRL (GLOVE) IMPLANT
GLOVE SKINSENSE NS SZ6.5 (GLOVE)
GLOVE SKINSENSE NS SZ7.0 (GLOVE)
GLOVE SKINSENSE STRL SZ6.5 (GLOVE) IMPLANT
GLOVE SKINSENSE STRL SZ7.0 (GLOVE) IMPLANT
KIT VITRECTOMY (OPHTHALMIC RELATED) IMPLANT
PAD ARMBOARD 7.5X6 YLW CONV (MISCELLANEOUS) ×2 IMPLANT
PROC W NO LENS (INTRAOCULAR LENS)
PROC W SPEC LENS (INTRAOCULAR LENS)
PROCESS W NO LENS (INTRAOCULAR LENS) IMPLANT
PROCESS W SPEC LENS (INTRAOCULAR LENS) IMPLANT
RETRACTOR IRIS SIGHTPATH (OPHTHALMIC RELATED) IMPLANT
RING MALYGIN (MISCELLANEOUS) IMPLANT
SIGHTPATH CAT PROC W REG LENS (Ophthalmic Related) ×2 IMPLANT
SYRINGE LUER LOK 1CC (MISCELLANEOUS) ×2 IMPLANT
TAPE SURG TRANSPORE 1 IN (GAUZE/BANDAGES/DRESSINGS) ×1 IMPLANT
TAPE SURGICAL TRANSPORE 1 IN (GAUZE/BANDAGES/DRESSINGS) ×1
VISCOELASTIC ADDITIONAL (OPHTHALMIC RELATED) IMPLANT
WATER STERILE IRR 250ML POUR (IV SOLUTION) ×2 IMPLANT

## 2014-12-15 NOTE — H&P (Signed)
I have reviewed the H&P, the patient was re-examined, and I have identified no interval changes in medical condition and plan of care since the history and physical of record  

## 2014-12-15 NOTE — Anesthesia Postprocedure Evaluation (Signed)
Anesthesia Post Note  Patient: [name]  Procedure(s) Performed: Procedure(s) (LRB): CATARACT EXTRACTION PHACO AND INTRAOCULAR LENS PLACEMENT RIGHT EYE CDE=5.16 (Right)  Patient location during evaluation: Short Stay Anesthesia Type: MAC Level of consciousness: awake and alert and patient cooperative Pain management: pain level controlled Vital Signs Assessment: post-procedure vital signs reviewed and stable Respiratory status: spontaneous breathing Cardiovascular status: stable Postop Assessment: adequate PO intake Anesthetic complications: no    Last Vitals:  Filed Vitals:   12/15/14 1030 12/15/14 1035  BP: 141/79 153/89  Pulse:    Temp:    Resp: 17 29    Last Pain:  Filed Vitals:   12/15/14 1036  PainSc: 0-No pain                 Kyomi Hector J

## 2014-12-15 NOTE — Op Note (Signed)
Date of Admission: 12/15/2014  Date of Surgery: 12/15/2014   Pre-Op Dx: Cataract Right Eye  Post-Op Dx: Senile Combined Cataract Right  Eye,  Dx Code [id number]  Surgeon: Tonny Branch, M.D.  Assistants: None  Anesthesia: Topical with MAC  Indications: Painless, progressive loss of vision with compromise of daily activities.  Surgery: Cataract Extraction with Intraocular lens Implant Right Eye  Discription: The patient had dilating drops and viscous lidocaine placed into the Right eye in the pre-op holding area. After transfer to the operating room, a time out was performed. The patient was then prepped and draped. Beginning with a 40 degree blade a paracentesis port was made at the surgeon's 2 o'clock position. The anterior chamber was then filled with 1% non-preserved lidocaine. This was followed by filling the anterior chamber with Provisc.  A 2.32mm keratome blade was used to make a clear corneal incision at the temporal limbus.  A bent cystatome needle was used to create a continuous tear capsulotomy. Hydrodissection was performed with balanced salt solution on a Fine canula. The lens nucleus was then removed using the phacoemulsification handpiece. Residual cortex was removed with the I&A handpiece. The anterior chamber and capsular bag were refilled with Provisc. A posterior chamber intraocular lens was placed into the capsular bag with it's injector. The implant was positioned with the Kuglan hook. The Provisc was then removed from the anterior chamber and capsular bag with the I&A handpiece. Stromal hydration of the main incision and paracentesis port was performed with BSS on a Fine canula. The wounds were tested for leak which was negative. The patient tolerated the procedure well. There were no operative complications. The patient was then transferred to the recovery room in stable condition.  Complications: None  Specimen: None  EBL: None  Prosthetic device: Hoya iSert 250, power 19.0  D, SN [id number].

## 2014-12-15 NOTE — Transfer of Care (Signed)
Immediate Anesthesia Transfer of Care Note  Patient: [name]  Procedure(s) Performed: Procedure(s): CATARACT EXTRACTION PHACO AND INTRAOCULAR LENS PLACEMENT RIGHT EYE CDE=5.16 (Right)  Patient Location: Short Stay  Anesthesia Type:MAC  Level of Consciousness: awake, alert , oriented and patient cooperative  Airway & Oxygen Therapy: Patient Spontanous Breathing  Post-op Assessment: Report given to RN, Post -op Vital signs reviewed and stable and Patient moving all extremities  Post vital signs: Reviewed and stable  Last Vitals:  Filed Vitals:   12/15/14 1030 12/15/14 1035  BP: 141/79 153/89  Pulse:    Temp:    Resp: 17 29    Complications: No apparent anesthesia complications

## 2014-12-15 NOTE — Anesthesia Preprocedure Evaluation (Signed)
Anesthesia Evaluation  Patient identified by MRN, date of birth, ID band Patient awake    Reviewed: Allergy & Precautions, NPO status , Patient's Chart, lab work & pertinent test results  History of Anesthesia Complications (+) PONV  Airway Mallampati: II  TM Distance: >3 FB Neck ROM: Full    Dental  (+) Teeth Intact   Pulmonary asthma , sleep apnea , pneumonia, resolved,     + decreased breath sounds      Cardiovascular hypertension, + Past MI  Normal cardiovascular exam     Neuro/Psych Anxiety    GI/Hepatic GERD  ,  Endo/Other    Renal/GU      Musculoskeletal   Abdominal Normal abdominal exam  (+)   Peds  Hematology  (+) anemia ,   Anesthesia Other Findings   Reproductive/Obstetrics                             Anesthesia Physical Anesthesia Plan  ASA: III  Anesthesia Plan: MAC   Post-op Pain Management:    Induction: Intravenous  Airway Management Planned: Nasal Cannula  Additional Equipment:   Intra-op Plan:   Post-operative Plan:   Informed Consent: I have reviewed the patients History and Physical, chart, labs and discussed the procedure including the risks, benefits and alternatives for the proposed anesthesia with the patient or authorized representative who has indicated his/her understanding and acceptance.     Plan Discussed with: CRNA  Anesthesia Plan Comments:         Anesthesia Quick Evaluation

## 2014-12-15 NOTE — Discharge Instructions (Signed)

## 2014-12-16 ENCOUNTER — Ambulatory Visit (HOSPITAL_COMMUNITY): Payer: Medicare Other

## 2014-12-16 ENCOUNTER — Encounter (HOSPITAL_COMMUNITY): Payer: Self-pay | Admitting: Ophthalmology

## 2014-12-16 ENCOUNTER — Ambulatory Visit (HOSPITAL_BASED_OUTPATIENT_CLINIC_OR_DEPARTMENT_OTHER): Payer: Medicare Other

## 2014-12-16 VITALS — BP 150/75 | HR 63 | Temp 98.2°F | Resp 18 | Wt 177.6 lb

## 2014-12-16 DIAGNOSIS — D801 Nonfamilial hypogammaglobulinemia: Secondary | ICD-10-CM

## 2014-12-16 DIAGNOSIS — H25811 Combined forms of age-related cataract, right eye: Secondary | ICD-10-CM | POA: Diagnosis not present

## 2014-12-16 DIAGNOSIS — C8338 Diffuse large B-cell lymphoma, lymph nodes of multiple sites: Secondary | ICD-10-CM

## 2014-12-16 LAB — COMPREHENSIVE METABOLIC PANEL
ALT: 21 U/L (ref 14–54)
AST: 29 U/L (ref 15–41)
Albumin: 3.8 g/dL (ref 3.5–5.0)
Alkaline Phosphatase: 87 U/L (ref 38–126)
Anion gap: 9 (ref 5–15)
BILIRUBIN TOTAL: 0.4 mg/dL (ref 0.3–1.2)
BUN: 11 mg/dL (ref 6–20)
CHLORIDE: 105 mmol/L (ref 101–111)
CO2: 27 mmol/L (ref 22–32)
CREATININE: 1.04 mg/dL — AB (ref 0.44–1.00)
Calcium: 9 mg/dL (ref 8.9–10.3)
GFR, EST NON AFRICAN AMERICAN: 58 mL/min — AB (ref >60)
Glucose, Bld: 110 mg/dL — ABNORMAL HIGH (ref 65–99)
Potassium: 3.9 mmol/L (ref 3.5–5.1)
Sodium: 141 mmol/L (ref 135–145)
TOTAL PROTEIN: 6.8 g/dL (ref 6.5–8.1)

## 2014-12-16 LAB — CBC WITH DIFFERENTIAL/PLATELET
Basophils Absolute: 0 10*3/uL (ref 0.0–0.1)
Basophils Relative: 1 %
EOS PCT: 2 %
Eosinophils Absolute: 0.1 10*3/uL (ref 0.0–0.7)
HEMATOCRIT: 38.8 % (ref 36.0–46.0)
Hemoglobin: 11.9 g/dL — ABNORMAL LOW (ref 12.0–15.0)
LYMPHS ABS: 0.8 10*3/uL (ref 0.7–4.0)
LYMPHS PCT: 11 %
MCH: 27.5 pg (ref 26.0–34.0)
MCHC: 30.7 g/dL (ref 30.0–36.0)
MCV: 89.8 fL (ref 78.0–100.0)
MONO ABS: 0.3 10*3/uL (ref 0.1–1.0)
Monocytes Relative: 4 %
NEUTROS ABS: 5.9 10*3/uL (ref 1.7–7.7)
Neutrophils Relative %: 82 %
PLATELETS: 163 10*3/uL (ref 150–400)
RBC: 4.32 MIL/uL (ref 3.87–5.11)
RDW: 17.4 % — AB (ref 11.5–15.5)
WBC: 7.1 10*3/uL (ref 4.0–10.5)

## 2014-12-16 MED ORDER — SODIUM CHLORIDE 0.9 % IJ SOLN
10.0000 mL | INTRAMUSCULAR | Status: DC | PRN
  Administered 2014-12-16: 10 mL
  Filled 2014-12-16: qty 10

## 2014-12-16 MED ORDER — DIPHENHYDRAMINE HCL 25 MG PO CAPS
25.0000 mg | ORAL_CAPSULE | Freq: Once | ORAL | Status: DC
  Filled 2014-12-16: qty 1

## 2014-12-16 MED ORDER — SODIUM CHLORIDE 0.9 % IV SOLN
Freq: Once | INTRAVENOUS | Status: AC
  Administered 2014-12-16: 09:00:00 via INTRAVENOUS

## 2014-12-16 MED ORDER — IMMUNE GLOBULIN (HUMAN) 10 GM/100ML IV SOLN
50.0000 g | Freq: Once | INTRAVENOUS | Status: AC
  Administered 2014-12-16: 50 g via INTRAVENOUS
  Filled 2014-12-16: qty 500

## 2014-12-16 MED ORDER — ACETAMINOPHEN 325 MG PO TABS: 650.0000 mg | ORAL_TABLET | Freq: Four times a day (QID) | ORAL | Status: DC | PRN

## 2014-12-16 NOTE — Progress Notes (Signed)
Labs drawn by RN 

## 2014-12-16 NOTE — Patient Instructions (Signed)
[place] at [facility] Discharge Instructions  RECOMMENDATIONS MADE BY THE CONSULTANT AND ANY TEST RESULTS WILL BE SENT TO YOUR REFERRING PHYSICIAN.  IVIG today as ordered. Return as scheduled.  Thank you for choosing [place] at [facility] to provide your oncology and hematology care.  To afford each patient quality time with our provider, please arrive at least 15 minutes before your scheduled appointment time.    You need to re-schedule your appointment should you arrive 10 or more minutes late.  We strive to give you quality time with our providers, and arriving late affects you and other patients whose appointments are after yours.  Also, if you no show three or more times for appointments you may be dismissed from the clinic at the providers discretion.     Again, thank you for choosing [facility].  Our hope is that these requests will decrease the amount of time that you wait before being seen by our physicians.       _____________________________________________________________  Should you have questions after your visit to [facility], please contact our office at [phone] between the hours of 8:30 a.m. and 4:30 p.m.  Voicemails left after 4:30 p.m. will not be returned until the following business day.  For prescription refill requests, have your pharmacy contact our office.

## 2014-12-16 NOTE — Progress Notes (Signed)
Patient reports cataract surgery right eye yesterday. Reports taking tylenol and benadryl at home this am prior to today's appointment.  Tolerated IVIG infusion well. Ambulatory on discharge home to self.

## 2014-12-17 LAB — IGG, IGA, IGM
IGA: 56 mg/dL — AB (ref 87–352)
IGG (IMMUNOGLOBIN G), SERUM: 814 mg/dL (ref 700–1600)
IgM, Serum: 84 mg/dL (ref 26–217)

## 2014-12-19 ENCOUNTER — Other Ambulatory Visit (HOSPITAL_COMMUNITY): Payer: Medicare Other

## 2014-12-19 ENCOUNTER — Ambulatory Visit (HOSPITAL_COMMUNITY): Payer: Medicare Other

## 2015-01-11 ENCOUNTER — Other Ambulatory Visit: Payer: Self-pay | Admitting: Adult Health

## 2015-01-12 ENCOUNTER — Ambulatory Visit (HOSPITAL_COMMUNITY): Payer: Medicare Other | Admitting: Hematology & Oncology

## 2015-01-12 ENCOUNTER — Ambulatory Visit (HOSPITAL_COMMUNITY): Payer: Medicare Other

## 2015-01-18 ENCOUNTER — Encounter (HOSPITAL_COMMUNITY): Payer: Self-pay | Admitting: Oncology

## 2015-01-18 DIAGNOSIS — E538 Deficiency of other specified B group vitamins: Secondary | ICD-10-CM

## 2015-01-18 DIAGNOSIS — D509 Iron deficiency anemia, unspecified: Secondary | ICD-10-CM

## 2015-01-18 DIAGNOSIS — G47419 Narcolepsy without cataplexy: Secondary | ICD-10-CM

## 2015-01-18 HISTORY — DX: Iron deficiency anemia, unspecified: D50.9

## 2015-01-18 HISTORY — DX: Narcolepsy without cataplexy: G47.419

## 2015-01-18 HISTORY — DX: Deficiency of other specified B group vitamins: E53.8

## 2015-01-18 NOTE — Assessment & Plan Note (Signed)
Newly discovered with a low-normal value on 11/14/2014 with negative anti-parietal cell and intrinsic factor antibody testing on 11/20/2014.  She is S/P 3 weekly doses of B12 injections from 11/20/2014- 12/11/2014.  She is encouraged to take PO B12 500- 1000 mcg daily.  Labs today and in 3 months: B12

## 2015-01-18 NOTE — Progress Notes (Signed)
[name], MD [place] [id number]  Narcolepsy without cataplexy  Hypogammaglobulinemia, acquired ([place])  Diffuse large B-cell lymphoma of lymph nodes of multiple regions ([place]) - Plan: Lactate dehydrogenase, Lactate dehydrogenase  Iron deficiency anemia - Plan: Ferritin, Iron and TIBC, Ferritin, Iron and TIBC  B12 deficiency - Plan: Vitamin B12, Vitamin B12  CURRENT THERAPY: 50 g of IVIG monthly for IgG deficiency  INTERVAL HISTORY: [name] 60 y.o. female returns for followup of Hypogammaglobulinemia/IgG deficiency with a significant history of recurrent URI/LRI. AND History of DLBCL, S/P R-CHOP x 4 cycles with IT MTX during cycles 3 and 4.  Treatment was discontinued early due to Pseudomonas sepsis after cycle 2 and 4 (life-threatening).  She finished maintenance Rituxan on 09/30/2011. AND Iron deficiency anemia, newly discovered on 11/14/2014, S/P IV Ferhame 510 mg on 11/20/2014.  Excellent response to IV iron replacement. AND Low-normal B12, with negative anti-parietal cell and intrinsic factor antibodies, started on weekly B12 on 11/20/2014- 12/11/2014 with transition to PO replacement.  I personally reviewed and went over laboratory results with the patient.  The results are noted within this dictation.  She is educated on B12 def and pernicious anemia (which she does not have).  Additionally, she is educated on iron deficiency anemia.  Her iron is replaced according to her iron deficit calculation.  Her HGB has responded nicely to this intervention.  She admits to increased stamina and less fatigue since receiving IV iron.  I personally reviewed and went over radiographic studies with the patient.  The results are noted within this dictation.  CT abd/pelvis in November 2016 was negative for any acute findings and negative for any findings concerning for lymphoma recurrence.  She is doing well.  She notes that her narcolepsy treatment  is helping significantly.  She is to continue follow-up with neurology as directed.   Past Medical History  Diagnosis Date  . Allergic rhinitis   . GERD (gastroesophageal reflux disease)   . Chronic sinusitis   . Respiratory failure ([place])   . Cavitary lung disease   . PONV (postoperative nausea and vomiting)   . Endotracheally intubated   . Anxiety   . Anemia   . Pneumonia 01/2013  . Hypogammaglobulinemia, acquired ([place]) 12/30/2009    Qualifier: Diagnosis of  By: Joya Gaskins MD, Burnett Harry   . Non Hodgkin's lymphoma ([place]) 2011  . Myocardial infarction ([place]) 2011  . Asthma   . Chronic kidney disease   . Iron deficiency anemia 01/18/2015  . B12 deficiency 01/18/2015  . 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     is allergic to meperidine hcl and montelukast sodium.  Current Outpatient Prescriptions on File Prior to Visit  Medication Sig Dispense Refill  . acetaminophen (TYLENOL) 500 MG tablet Take 1,000 mg by mouth every 6 (six) hours as needed for pain or fever.     [name] Kitchen acyclovir (ZOVIRAX) 400 MG tablet TAKE ONE TABLET BY MOUTH TWICE DAILY. 60 tablet 3  . albuterol (PROVENTIL) (2.5 MG/3ML) 0.083% nebulizer solution Take 2.5 mg by nebulization every 4 (four) hours as  needed for wheezing or shortness of breath.    . Armodafinil (NUVIGIL) 150 MG tablet Take 150 mg by mouth daily.    . citalopram (CELEXA) 20 MG tablet Take 20 mg by mouth every morning.      . cyanocobalamin  (,VITAMIN B-12,) 1000 MCG/ML injection Inject 1 mL (1,000 mcg total) into the muscle once. 1 mL 0  . fluticasone (FLONASE) 50 MCG/ACT nasal spray Place 2 sprays into the nose 2 (two) times daily. 16 g 6  . furosemide (LASIX) 20 MG tablet Take 20 mg by mouth as needed for fluid.   5  . gabapentin (NEURONTIN) 300 MG capsule TAKE 1 CAPSULE BY MOUTH FOUR TIMES DAILY. 120 capsule 3  . mometasone-formoterol (DULERA) 200-5 MCG/ACT AERO Inhale 2 puffs into the lungs 2 (two) times daily. 3 Inhaler 4  . omeprazole (PRILOSEC) 20 MG capsule TAKE ONE CAPSULE BY MOUTH TWICE DAILY BEFORE A MEAL. 180 capsule 0  . predniSONE (DELTASONE) 10 MG tablet Take 4 for four days 3 for four days 2 for four days then 1 daily and STAY (Patient taking differently: Take 5 mg by mouth daily. ) 60 tablet 11  . simvastatin (ZOCOR) 80 MG tablet Take 80 mg by mouth at bedtime.      . VENTOLIN HFA 108 (90 BASE) MCG/ACT inhaler INHALE 2 PUFFS INTO THE LUNGS EVERY 6 HOURS AS NEEDED. 18 g 3  . warfarin (COUMADIN) 10 MG tablet Take 5 mg by mouth every evening.     . zafirlukast (ACCOLATE) 20 MG tablet TAKE 1 TABLET BY MOUTH TWICE DAILY. 60 tablet 6  . tiotropium (SPIRIVA) 18 MCG inhalation capsule Place 1 capsule (18 mcg total) into inhaler and inhale daily. 30 capsule 3   Current Facility-Administered Medications on File Prior to Visit  Medication Dose Route Frequency Provider Last Rate Last Dose  . 0.9 %  sodium chloride infusion   Intravenous Once Baird Cancer, PA-C      . acetaminophen (TYLENOL) tablet 650 mg  650 mg Oral Q6H PRN Baird Cancer, PA-C      . diphenhydrAMINE (BENADRYL) capsule 25 mg  25 mg Oral Once [employer], PA-C      . Immune Globulin 10% (OCTAGAM) IV infusion 50 g  50 g Intravenous Once Patrici Ranks, MD      . sodium chloride 0.9 % injection 10 mL  10 mL Intracatheter PRN Baird Cancer, PA-C        Past Surgical History  Procedure Laterality Date  . Vesicovaginal fistula closure w/ tah      . Nasal sinus surgery    . Neck surgery      x 2   . Portacath placement  7/11    Removed 6/12  . Basal cell carcinoma excision  03/2011    scalp  . Port-a-cath removal    . Peripherally inserted central catheter insertion    . Picc removal    . Tracheostomy    . Abdominal hysterectomy    . Breast surgery Right   . Colonoscopy N/A 11/14/2012    Procedure: COLONOSCOPY;  Surgeon: Rogene Houston, MD;  Location: [facility];  Service: Endoscopy;  Laterality: N/A;  830  . Cataract extraction w/phaco Left 11/17/2014    Procedure: CATARACT EXTRACTION PHACO AND INTRAOCULAR LENS PLACEMENT LEFT EYE;  Surgeon: Tonny Branch, MD;  Location: [facility] ORS;  Service: Ophthalmology;  Laterality: Left;  CDE:5.60  . Cataract extraction w/phaco Right 12/15/2014  Procedure: CATARACT EXTRACTION PHACO AND INTRAOCULAR LENS PLACEMENT RIGHT EYE CDE=5.16;  Surgeon: Tonny Branch, MD;  Location: [facility];  Service: Ophthalmology;  Laterality: Right;    Denies any headaches, dizziness, double vision, fevers, chills, night sweats, nausea, vomiting, diarrhea, constipation, chest pain, heart palpitations, shortness of breath, blood in stool, black tarry stool, urinary pain, urinary burning, urinary frequency, hematuria.   PHYSICAL EXAMINATION  ECOG PERFORMANCE STATUS: 1 - Symptomatic but completely ambulatory  Filed Vitals:   01/20/15 1004  BP: 136/73  Pulse: 100  Temp: 97.9 F (36.6 C)  Resp: 22    GENERAL:alert, no distress, well nourished, well developed, comfortable, cooperative, smiling and unaccompanied SKIN: skin color, texture, turgor are normal, no rashes or significant lesions HEAD: Normocephalic, No masses, lesions, tenderness or abnormalities EYES: normal, EOMI, Conjunctiva are pink and non-injected EARS: External ears normal OROPHARYNX:lips, buccal mucosa, and tongue normal and mucous membranes are moist  NECK: supple, thyroid normal size, non-tender, without nodularity, trachea midline LYMPH:  no  palpable lymphadenopathy BREAST:not examined LUNGS: clear to auscultation and percussion, decreased breath sounds HEART: regular rate & rhythm, no murmurs, no gallops, S1 normal and S2 normal ABDOMEN:abdomen soft, non-tender and normal bowel sounds BACK: Back symmetric, no curvature., No CVA tenderness EXTREMITIES:less then 2 second capillary refill, no joint deformities, effusion, or inflammation, no skin discoloration, no clubbing, no cyanosis  NEURO: alert & oriented x 3 with fluent speech, no focal motor/sensory deficits, gait normal   LABORATORY DATA: CBC    Component Value Date/Time   WBC 6.0 01/20/2015 0935   RBC 4.29 01/20/2015 0935   RBC 3.97 11/14/2014 1320   HGB 12.6 01/20/2015 0935   HCT 39.3 01/20/2015 0935   PLT 143* 01/20/2015 0935   MCV 91.6 01/20/2015 0935   MCH 29.4 01/20/2015 0935   MCHC 32.1 01/20/2015 0935   RDW 16.4* 01/20/2015 0935   LYMPHSABS 0.9 01/20/2015 0935   MONOABS 0.5 01/20/2015 0935   EOSABS 0.1 01/20/2015 0935   BASOSABS 0.0 01/20/2015 0935      Chemistry      Component Value Date/Time   NA 140 01/20/2015 0935   K 3.9 01/20/2015 0935   CL 103 01/20/2015 0935   CO2 30 01/20/2015 0935   BUN 16 01/20/2015 0935   CREATININE 1.15* 01/20/2015 0935      Component Value Date/Time   CALCIUM 8.9 01/20/2015 0935   ALKPHOS 92 01/20/2015 0935   AST 18 01/20/2015 0935   ALT 14 01/20/2015 0935   BILITOT 0.4 01/20/2015 0935        PENDING LABS:   RADIOGRAPHIC STUDIES:  No results found.   PATHOLOGY:    ASSESSMENT AND PLAN:  Hypogammaglobulinemia, acquired ([place]) Hypogammaglobulinemia/IgG deficiency with a significant history of recurrent URI/LRI followed by pulmonology.  Receiving IVIG monthly with significant decrease in rate of infections.  Monthly labs: CBC diff, CMET, IgG, IgA, and IgM.  Return in 3 months for follow-up with continued IVIG infusions monthly.  Non Hodgkin's lymphoma ([place]) History of DLBCL, S/P R-CHOP x 4  cycles with IT MTX during cycles 3 and 4.  Treatment was discontinued early due to Pseudomonas sepsis after cycle 2 and 4 (life-threatening).  She finished maintenance Rituxan on 09/30/2011.  NED  Iron deficiency anemia Iron deficiency anemia, newly discovered on 11/14/2014, S/P IV Ferhame 510 mg on 11/20/2014.  Her last colonoscopy was by Dr. Laural Golden in November 2014.  Depending on future labs and course, she may need to be referred back to Dr. Laural Golden for  GI work-up.  Ferritin was 14 on 11/20/2014 with a HGB of 9.9 g/dL, normocytic, normochromic.  Since getting IV feraheme, her Hgb is normal.  I will update ferritin today and given her excellent and quick response to IV iron, she may need another dose based on ferritin level.  Her response is an excellent indicator that her bone marrow is working well.  Stool cards x 3 were ordered but not collected.  For now, I will cancel.  Labs today and in 3 months: CBC diff, iron/TIBC, ferritin.  If need for future IV iron, will refer the patient to Dr. Laural Golden for GI work-up.  Last colonoscopy was in 2014.  B12 deficiency Newly discovered with a low-normal value on 11/14/2014 with negative anti-parietal cell and intrinsic factor antibody testing on 11/20/2014.  She is S/P 3 weekly doses of B12 injections from 11/20/2014- 12/11/2014.  She is encouraged to take PO B12 500- 1000 mcg daily.  Labs today and in 3 months: B12  Narcolepsy without cataplexy Diagnosed in October 2016.  Managed by Dr. Merlene Laughter.    THERAPY PLAN:  Continue with monthly IVIG.  Continue follow-up with pulmonology as directed.  She completed therapy for DLBCL with curative intent in 2011 and therefore her risk of recurrence at this time is low.  NCCN guidelines recommends the follow surveillance for Stage III-IV NHL for those who attain a complete response to therapy:  A. H&P every 3-6 months for 5 years, then yearly or as clinically indicated.  B. Labs every 3-6 months for 5 years  and then annually or as clinically indicated.  C. CT imaging no more than every 6 months for the first two years after completion of therapy and then only as clinically indicated.    All questions were answered. The patient knows to call the clinic with any problems, questions or concerns. We can certainly see the patient much sooner if necessary.  Patient and plan discussed with Dr. Ancil Linsey and she is in agreement with the aforementioned.   This note is electronically signed by: Robynn Pane, PA-C 01/20/2015 11:03 AM

## 2015-01-18 NOTE — Assessment & Plan Note (Signed)
Diagnosed in October 2016.  Managed by Dr. Merlene Laughter.

## 2015-01-18 NOTE — Assessment & Plan Note (Signed)
History of DLBCL, S/P R-CHOP x 4 cycles with IT MTX during cycles 3 and 4.  Treatment was discontinued early due to Pseudomonas sepsis after cycle 2 and 4 (life-threatening).  She finished maintenance Rituxan on 09/30/2011.  NED

## 2015-01-18 NOTE — Assessment & Plan Note (Addendum)
Iron deficiency anemia, newly discovered on 11/14/2014, S/P IV Ferhame 510 mg on 11/20/2014.  Her last colonoscopy was by Dr. Laural Golden in November 2014.  Depending on future labs and course, she may need to be referred back to Dr. Laural Golden for GI work-up.  Ferritin was 14 on 11/20/2014 with a HGB of 9.9 g/dL, normocytic, normochromic.  Since getting IV feraheme, her Hgb is normal.  I will update ferritin today and given her excellent and quick response to IV iron, she may need another dose based on ferritin level.  Her response is an excellent indicator that her bone marrow is working well.  Stool cards x 3 were ordered but not collected.  For now, I will cancel.  Labs today and in 3 months: CBC diff, iron/TIBC, ferritin.  If need for future IV iron, will refer the patient to Dr. Laural Golden for GI work-up.  Last colonoscopy was in 2014.

## 2015-01-18 NOTE — Assessment & Plan Note (Signed)
Hypogammaglobulinemia/IgG deficiency with a significant history of recurrent URI/LRI followed by pulmonology.  Receiving IVIG monthly with significant decrease in rate of infections.  Monthly labs: CBC diff, CMET, IgG, IgA, and IgM.  Return in 3 months for follow-up with continued IVIG infusions monthly.

## 2015-01-20 ENCOUNTER — Encounter (HOSPITAL_COMMUNITY): Payer: Self-pay | Admitting: Oncology

## 2015-01-20 ENCOUNTER — Encounter (HOSPITAL_COMMUNITY): Payer: Medicare Other | Attending: Hematology and Oncology

## 2015-01-20 ENCOUNTER — Encounter (HOSPITAL_COMMUNITY): Payer: Medicare Other

## 2015-01-20 ENCOUNTER — Encounter (HOSPITAL_BASED_OUTPATIENT_CLINIC_OR_DEPARTMENT_OTHER): Payer: Medicare Other | Admitting: Oncology

## 2015-01-20 ENCOUNTER — Ambulatory Visit (HOSPITAL_COMMUNITY): Payer: Medicare Other | Admitting: Hematology & Oncology

## 2015-01-20 VITALS — BP 143/77 | HR 66 | Temp 98.0°F | Resp 18

## 2015-01-20 VITALS — BP 136/73 | HR 100 | Temp 97.9°F | Resp 22 | Wt 174.6 lb

## 2015-01-20 DIAGNOSIS — D800 Hereditary hypogammaglobulinemia: Secondary | ICD-10-CM | POA: Diagnosis present

## 2015-01-20 DIAGNOSIS — D801 Nonfamilial hypogammaglobulinemia: Secondary | ICD-10-CM

## 2015-01-20 DIAGNOSIS — C8338 Diffuse large B-cell lymphoma, lymph nodes of multiple sites: Secondary | ICD-10-CM

## 2015-01-20 DIAGNOSIS — D869 Sarcoidosis, unspecified: Secondary | ICD-10-CM | POA: Insufficient documentation

## 2015-01-20 DIAGNOSIS — E538 Deficiency of other specified B group vitamins: Secondary | ICD-10-CM

## 2015-01-20 DIAGNOSIS — G47419 Narcolepsy without cataplexy: Secondary | ICD-10-CM

## 2015-01-20 DIAGNOSIS — C8590 Non-Hodgkin lymphoma, unspecified, unspecified site: Secondary | ICD-10-CM | POA: Insufficient documentation

## 2015-01-20 DIAGNOSIS — D509 Iron deficiency anemia, unspecified: Secondary | ICD-10-CM

## 2015-01-20 DIAGNOSIS — Z8572 Personal history of non-Hodgkin lymphomas: Secondary | ICD-10-CM

## 2015-01-20 LAB — COMPREHENSIVE METABOLIC PANEL
ALBUMIN: 3.6 g/dL (ref 3.5–5.0)
ALT: 14 U/L (ref 14–54)
AST: 18 U/L (ref 15–41)
Alkaline Phosphatase: 92 U/L (ref 38–126)
Anion gap: 7 (ref 5–15)
BUN: 16 mg/dL (ref 6–20)
CHLORIDE: 103 mmol/L (ref 101–111)
CO2: 30 mmol/L (ref 22–32)
CREATININE: 1.15 mg/dL — AB (ref 0.44–1.00)
Calcium: 8.9 mg/dL (ref 8.9–10.3)
GFR calc Af Amer: 59 mL/min — ABNORMAL LOW (ref >60)
GFR calc non Af Amer: 51 mL/min — ABNORMAL LOW (ref >60)
GLUCOSE: 125 mg/dL — AB (ref 65–99)
POTASSIUM: 3.9 mmol/L (ref 3.5–5.1)
Sodium: 140 mmol/L (ref 135–145)
Total Bilirubin: 0.4 mg/dL (ref 0.3–1.2)
Total Protein: 6.4 g/dL — ABNORMAL LOW (ref 6.5–8.1)

## 2015-01-20 LAB — CBC WITH DIFFERENTIAL/PLATELET
BASOS ABS: 0 10*3/uL (ref 0.0–0.1)
BASOS PCT: 0 %
EOS PCT: 2 %
Eosinophils Absolute: 0.1 10*3/uL (ref 0.0–0.7)
HCT: 39.3 % (ref 36.0–46.0)
Hemoglobin: 12.6 g/dL (ref 12.0–15.0)
LYMPHS PCT: 16 %
Lymphs Abs: 0.9 10*3/uL (ref 0.7–4.0)
MCH: 29.4 pg (ref 26.0–34.0)
MCHC: 32.1 g/dL (ref 30.0–36.0)
MCV: 91.6 fL (ref 78.0–100.0)
MONO ABS: 0.5 10*3/uL (ref 0.1–1.0)
Monocytes Relative: 8 %
NEUTROS ABS: 4.4 10*3/uL (ref 1.7–7.7)
Neutrophils Relative %: 74 %
PLATELETS: 143 10*3/uL — AB (ref 150–400)
RBC: 4.29 MIL/uL (ref 3.87–5.11)
RDW: 16.4 % — AB (ref 11.5–15.5)
WBC: 6 10*3/uL (ref 4.0–10.5)

## 2015-01-20 LAB — VITAMIN B12: VITAMIN B 12: 472 pg/mL (ref 180–914)

## 2015-01-20 LAB — FERRITIN: Ferritin: 35 ng/mL (ref 11–307)

## 2015-01-20 LAB — IRON AND TIBC
Iron: 69 ug/dL (ref 28–170)
SATURATION RATIOS: 29 % (ref 10.4–31.8)
TIBC: 241 ug/dL — ABNORMAL LOW (ref 250–450)
UIBC: 172 ug/dL

## 2015-01-20 LAB — LACTATE DEHYDROGENASE: LDH: 165 U/L (ref 98–192)

## 2015-01-20 MED ORDER — DIPHENHYDRAMINE HCL 25 MG PO CAPS
25.0000 mg | ORAL_CAPSULE | Freq: Once | ORAL | Status: DC
  Filled 2015-01-20: qty 1

## 2015-01-20 MED ORDER — IMMUNE GLOBULIN (HUMAN) 20 GM/200ML IV SOLN
50.0000 g | Freq: Once | INTRAVENOUS | Status: AC
  Administered 2015-01-20: 50 g via INTRAVENOUS
  Filled 2015-01-20: qty 100

## 2015-01-20 MED ORDER — ACETAMINOPHEN 325 MG PO TABS
650.0000 mg | ORAL_TABLET | Freq: Four times a day (QID) | ORAL | Status: DC | PRN
Start: 2015-01-20 — End: 2015-01-20
  Filled 2015-01-20: qty 2

## 2015-01-20 MED ORDER — SODIUM CHLORIDE 0.9 % IV SOLN
Freq: Once | INTRAVENOUS | Status: AC
  Administered 2015-01-20: 11:00:00 via INTRAVENOUS

## 2015-01-20 MED ORDER — IMMUNE GLOBULIN (HUMAN) 10 GM/100ML IV SOLN
50.0000 g | Freq: Once | INTRAVENOUS | Status: DC
  Filled 2015-01-20: qty 500

## 2015-01-20 MED ORDER — IMMUNE GLOBULIN (HUMAN) 10 GM/100ML IV SOLN
50.0000 g | Freq: Once | INTRAVENOUS | Status: DC
Start: 2015-01-20 — End: 2015-01-20

## 2015-01-20 MED ORDER — SODIUM CHLORIDE 0.9 % IJ SOLN: 10.0000 mL | INTRAMUSCULAR | Status: DC | PRN

## 2015-01-20 NOTE — Patient Instructions (Signed)
[place] at [facility] Discharge Instructions  RECOMMENDATIONS MADE BY THE CONSULTANT AND ANY TEST RESULTS WILL BE SENT TO YOUR REFERRING PHYSICIAN.  IVIG infusion as ordered. Return as scheduled.  Thank you for choosing [place] at [facility] to provide your oncology and hematology care.  To afford each patient quality time with our provider, please arrive at least 15 minutes before your scheduled appointment time.    You need to re-schedule your appointment should you arrive 10 or more minutes late.  We strive to give you quality time with our providers, and arriving late affects you and other patients whose appointments are after yours.  Also, if you no show three or more times for appointments you may be dismissed from the clinic at the providers discretion.     Again, thank you for choosing [facility].  Our hope is that these requests will decrease the amount of time that you wait before being seen by our physicians.       _____________________________________________________________  Should you have questions after your visit to [facility], please contact our office at [phone] between the hours of 8:30 a.m. and 4:30 p.m.  Voicemails left after 4:30 p.m. will not be returned until the following business day.  For prescription refill requests, have your pharmacy contact our office.

## 2015-01-20 NOTE — Progress Notes (Signed)
Patient reports she took tylenol and benadryl at home this morning prior to her appointment for infusion.  Tolerated infusion well.  Ambulatory on discharge home to self.

## 2015-01-20 NOTE — Patient Instructions (Addendum)
[place] at [facility] Discharge Instructions  RECOMMENDATIONS MADE BY THE CONSULTANT AND ANY TEST RESULTS WILL BE SENT TO YOUR REFERRING PHYSICIAN.  Exam done and seen today by [name] PA-C Will need monthly labs Due in 22months Start B12  500-108mcg daily Discontinue Final B-12 injection Discontinue Stool cards for now Follow up with your pulmonary doc as directed Return in 58months for follow up Continue with IVIG today   Thank you for choosing [place] at [facility] to provide your oncology and hematology care.  To afford each patient quality time with our provider, please arrive at least 15 minutes before your scheduled appointment time.    You need to re-schedule your appointment should you arrive 10 or more minutes late.  We strive to give you quality time with our providers, and arriving late affects you and other patients whose appointments are after yours.  Also, if you no show three or more times for appointments you may be dismissed from the clinic at the providers discretion.     Again, thank you for choosing [facility].  Our hope is that these requests will decrease the amount of time that you wait before being seen by our physicians.       _____________________________________________________________  Should you have questions after your visit to [facility], please contact our office at [phone] between the hours of 8:30 a.m. and 4:30 p.m.  Voicemails left after 4:30 p.m. will not be returned until the following business day.  For prescription refill requests, have your pharmacy contact our office.

## 2015-01-21 ENCOUNTER — Other Ambulatory Visit (HOSPITAL_COMMUNITY): Payer: Self-pay | Admitting: Oncology

## 2015-01-21 ENCOUNTER — Ambulatory Visit (INDEPENDENT_AMBULATORY_CARE_PROVIDER_SITE_OTHER): Payer: Medicare Other | Admitting: Pulmonary Disease

## 2015-01-21 ENCOUNTER — Encounter: Payer: Self-pay | Admitting: Pulmonary Disease

## 2015-01-21 ENCOUNTER — Other Ambulatory Visit: Payer: Medicare Other

## 2015-01-21 VITALS — BP 130/80 | HR 65 | Ht 62.0 in | Wt 174.2 lb

## 2015-01-21 DIAGNOSIS — J4540 Moderate persistent asthma, uncomplicated: Secondary | ICD-10-CM

## 2015-01-21 DIAGNOSIS — Z7952 Long term (current) use of systemic steroids: Secondary | ICD-10-CM | POA: Diagnosis not present

## 2015-01-21 DIAGNOSIS — J324 Chronic pansinusitis: Secondary | ICD-10-CM | POA: Diagnosis not present

## 2015-01-21 DIAGNOSIS — D509 Iron deficiency anemia, unspecified: Secondary | ICD-10-CM

## 2015-01-21 DIAGNOSIS — K219 Gastro-esophageal reflux disease without esophagitis: Secondary | ICD-10-CM | POA: Diagnosis not present

## 2015-01-21 LAB — IGG, IGA, IGM
IGA: 55 mg/dL — AB (ref 87–352)
IGG (IMMUNOGLOBIN G), SERUM: 763 mg/dL (ref 700–1600)
IgM, Serum: 76 mg/dL (ref 26–217)

## 2015-01-21 MED ORDER — AMOXICILLIN-POT CLAVULANATE 875-125 MG PO TABS: 1.0000 | ORAL_TABLET | Freq: Two times a day (BID) | ORAL | Status: DC

## 2015-01-21 NOTE — Progress Notes (Signed)
Subjective:    Patient ID: [name], female    DOB: 08-17-1955, 60 y.o.   MRN: [id number]  C.C.:  Follow-up for Asthma, Allergic/Chronic Rhinonsinusitis, OSA, DVT, GERD, & Chronic Prednisone Use.  HPI Moderate, Persistent Asthma:  Spiriva added to patient's regimen of Dulera and zafirlukast at last appointment.  She reports some coughing & wheezing with exertion. No nocturnal awakenings with any breathing problems. She feels Spiriva has helped her "hard cough" significantly. She reports rare use of her rescue inhaler & nebulizer. No exacerbations since last appointment. She has been on Prednisone for years and currently takes 5mg  daily.  Allergic Rhinitis/Chronic Sinusitis:  Recommended to try using nasal saline rinse twice daily at last appointment. She has had had sinus infection since last appointment and was treated with Levaquin recently for 14 days without a significant change. She reports a foul smell to her discharge. She reports only some mild, intermittent sinus pressure. She reports mostly it is on the right. No longer doing Flonase.   OSA: Managed by outside physician. Reports compliant with CPAP therapy. Also diagnosed with narcolepsy and was started on Nuvigil. She has had significant improvement in daytime arousal.   DVT:  Patient had DVT & SVC syndrome from clot around her port-a-cath in 2012. Follows with hematology & oncology on chronic systemic anticoagulation with Coumadin.  GERD:  Normal barium swallow in September 2016. Compliant with Prilosec bid. No reflux or dyspepsia.   Chronic Prednisone Use:  Patient has been on Prednisone for years. Currently taking 5mg  daily.   Review of Systems No fever, chills or sweats. No chest pain or pressure.   Allergies  Allergen Reactions  . Meperidine Hcl Anaphylaxis  . Montelukast Sodium Hives and Rash   Current Outpatient Prescriptions on File Prior to Visit  Medication Sig Dispense Refill  . acetaminophen (TYLENOL) 500 MG  tablet Take 1,000 mg by mouth every 6 (six) hours as needed for pain or fever.     [name] Kitchen acyclovir (ZOVIRAX) 400 MG tablet TAKE ONE TABLET BY MOUTH TWICE DAILY. 60 tablet 3  . albuterol (PROVENTIL) (2.5 MG/3ML) 0.083% nebulizer solution Take 2.5 mg by nebulization every 4 (four) hours as needed for wheezing or shortness of breath.    . Armodafinil (NUVIGIL) 150 MG tablet Take 150 mg by mouth daily.    . citalopram (CELEXA) 20 MG tablet Take 20 mg by mouth every morning.      . cyanocobalamin (,VITAMIN B-12,) 1000 MCG/ML injection Inject 1 mL (1,000 mcg total) into the muscle once. 1 mL 0  . fluticasone (FLONASE) 50 MCG/ACT nasal spray Place 2 sprays into the nose 2 (two) times daily. 16 g 6  . furosemide (LASIX) 20 MG tablet Take 20 mg by mouth as needed for fluid.   5  . gabapentin (NEURONTIN) 300 MG capsule TAKE 1 CAPSULE BY MOUTH FOUR TIMES DAILY. 120 capsule 3  . mometasone-formoterol (DULERA) 200-5 MCG/ACT AERO Inhale 2 puffs into the lungs 2 (two) times daily. 3 Inhaler 4  . omeprazole (PRILOSEC) 20 MG capsule TAKE ONE CAPSULE BY MOUTH TWICE DAILY BEFORE A MEAL. 180 capsule 0  . predniSONE (DELTASONE) 10 MG tablet Take 4 for four days 3 for four days 2 for four days then 1 daily and STAY (Patient taking differently: Take 5 mg by mouth daily. ) 60 tablet 11  . simvastatin (ZOCOR) 80 MG tablet Take 80 mg by mouth at bedtime.      [name] Kitchen tiotropium (SPIRIVA) 18 MCG inhalation capsule  Place 1 capsule (18 mcg total) into inhaler and inhale daily. 30 capsule 3  . VENTOLIN HFA 108 (90 BASE) MCG/ACT inhaler INHALE 2 PUFFS INTO THE LUNGS EVERY 6 HOURS AS NEEDED. 18 g 3  . warfarin (COUMADIN) 10 MG tablet Take 5 mg by mouth every evening.     . zafirlukast (ACCOLATE) 20 MG tablet TAKE 1 TABLET BY MOUTH TWICE DAILY. 60 tablet 6   No current facility-administered medications on file prior to visit.   Past Medical History  Diagnosis Date  . Allergic rhinitis   . GERD (gastroesophageal reflux disease)   .  Chronic sinusitis   . Respiratory failure ([place])   . Cavitary lung disease   . PONV (postoperative nausea and vomiting)   . Endotracheally intubated   . Anxiety   . Anemia   . Pneumonia 01/2013  . Hypogammaglobulinemia, acquired ([place]) 12/30/2009    Qualifier: Diagnosis of  By: Joya Gaskins MD, Burnett Harry   . Non Hodgkin's lymphoma ([place]) 2011  . Myocardial infarction ([place]) 2011  . Asthma   . Chronic kidney disease   . Iron deficiency anemia 01/18/2015  . B12 deficiency 01/18/2015  . Narcolepsy without cataplexy 01/18/2015   Past Surgical History  Procedure Laterality Date  . Vesicovaginal fistula closure w/ tah    . Nasal sinus surgery    . Neck surgery      x 2   . Portacath placement  7/11    Removed 6/12  . Basal cell carcinoma excision  03/2011    scalp  . Port-a-cath removal    . Peripherally inserted central catheter insertion    . Picc removal    . Tracheostomy    . Abdominal hysterectomy    . Breast surgery Right   . Colonoscopy N/A 11/14/2012    Procedure: COLONOSCOPY;  Surgeon: Rogene Houston, MD;  Location: [facility];  Service: Endoscopy;  Laterality: N/A;  830  . Cataract extraction w/phaco Left 11/17/2014    Procedure: CATARACT EXTRACTION PHACO AND INTRAOCULAR LENS PLACEMENT LEFT EYE;  Surgeon: Tonny Branch, MD;  Location: [facility] ORS;  Service: Ophthalmology;  Laterality: Left;  CDE:5.60  . Cataract extraction w/phaco Right 12/15/2014    Procedure: CATARACT EXTRACTION PHACO AND INTRAOCULAR LENS PLACEMENT RIGHT EYE CDE=5.16;  Surgeon: Tonny Branch, MD;  Location: [facility] ORS;  Service: Ophthalmology;  Laterality: Right;   Family History  Problem Relation Age of Onset  . Emphysema Mother   . Allergies Father   . Asthma Father     as a child  . Leukemia Maternal Grandmother   . Diabetes Brother   . Stroke Mother    Social History   Social History  . Marital Status: Divorced    Spouse Name: N/A  . Number of Children: 3  . Years of Education: N/A   Occupational History    . [place] History Main Topics  . Smoking status: Never Smoker   . Smokeless tobacco: Never Used  . Alcohol Use: No  . Drug Use: No  . Sexual Activity: No   Other Topics Concern  . None   Social History Narrative   Originally from [place]. Always lived in [place]. Prior travel to [place]. 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 She has 1 dog currently. She has 2 conures (small parrots). No mold exposure in her home. 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 No hot tub exposure. She enjoys sewing & quilting.       Objective:   Physical Exam BP 130/80 mmHg  Pulse 65  Ht 5\' 2"  (1.575 m)  Wt 174 lb 3.2 oz (79.017 kg)  BMI 31.85 kg/m2  SpO2 93% General:  Awake. Alert. No distress. Integument:  Warm & dry. No rash on exposed skin.  HEENT:  Moist mucus membranes. No oral ulcers. No sinus tenderness to palpation. Minimal purulent secretions coming from right nare. Cardiovascular:  Regular rate. No edema. Normal S1 & S2.  Pulmonary:  Faint end expiratory wheeze. Normal breathing on room air. Speaking in complete sentences.  Abdomen: Soft. Normal bowel sounds. Nondistended. Grossly nontender. Musculoskeletal:  Normal bulk and tone. No joint deformity or effusion appreciated.  PFT 10/08/14: FVC 1.68 L (54%) FEV1 1.14 L (47%) FEV1/FVC 0.67 FEF 25-75 0.75 L (33%) no bronchodilator response TLC 5.16 L (108%) ERV 11% DLCO uncorrected 69%  PSG Split Night Study (05/25/12): AHI 14.7 events/hour. ResMed Quattro Full Face. 15cm H2O. Baseline Sat 90% & Lowest Sat 75%.  IMAGING BARIUM SWALLOW (10/01/14): Normal esophageal motility. No mass or stricture. No hiatal hernia or reflux.  VENOUS DUPLEX LLE (06/26/14): No evidence of DVT  CT CHEST W/O 01/16/14 (previously reviewed by me): New right lower lobe reticulonodular infiltrate. Linear densities right upper lobe. Resolution of prior left lower lobe opacity.  No pathologic mediastinal adenopathy. No pleural effusion or thickening. No pericardial effusion. Minimal esophageal thickening.   CARDIAC TTE (11/02/09): LV ef 30-35% w/ diffuse hypokinesis. Normal wall thickness. RA normal in size. RV normal in size. Pulmonary artery poorly visualized. RVSP 13mmHg. No AS or AR. No MS w/ moderate MR. Tricuspid valve poorly visualized.  MICROBIOLOGY SPUTUM (04/29/10):  M. catarrahlis  TRACH ASPIRATE (11/23/09):  P. aeruginosa BAL (11/15/09):  Bacteria & AFB negative  LABS 09/25/14 IgE: 5 Aspergillus antigen:  0.25  08/12/14 CBC: 6.8/10.3/34.3/184 BMP: 141/3.7/102/29/16/1.15/91/8.7 LFT: 3.5/6.5/0.5/71/17/12  IgG: 858 IgA: 44 IgM: 94  INR: 2.61    Assessment & Plan:  60 year old Caucasian female with known history of asthma & chronic/allergic sinusitis. Patient's spirometry shows severe airways obstruction likely due to remodeling. Symptoms seem to have significantly improved with the addition of Spiriva to her regimen. I am concerned that the patient has acute on chronic sinusitis and given the history of purulent discharge from her sinuses I feel that further antibiotic therapy is reasonable with an alternative agent. Patient previously evaluated by ENT and known to have pansinusitis. Instructed the patient to contact my office if she had no clinical improvement on a course of antibiotics or new breathing problems before her next appointment.  1. Moderate, persistent asthma with severe fixed airway obstruction: Continuing patient on Dulera, Spiriva, & zafirlukast. Screening for alpha-1 antitrypsin deficiency. Repeat spirometry with bronchodilator challenge and DLCO next appointment. 2. Acute on chronic pansinusitis: Starting Augmentin 875 mg by mouth twice a day 2 weeks. Patient to refill medication if not completely resolved within 2 weeks. Checking maxillofacial CT scan. 3. GERD: Continuing patient on Prilosec. No changes. Well-controlled. 4.  Chronic oral steroid use: Checking DEXA scan. Continuing prednisone 10 mg by mouth daily. 5. OSA: Managed by outside physician. Checking CPAP compliance download given ongoing leak. 6. DVT & SVC syndrome: Currently on Coumadin. Patient to contact Coumadin clinic for further adjustment in her dose on antibiotic therapy. 7. Follow-up: Patient to return to clinic in 3 months or sooner if needed.

## 2015-01-21 NOTE — Patient Instructions (Signed)
1. Remember to take your antibiotic with food twice daily. Please call me if you have any rashes or diarrhea on this medication and stopped using it immediately. 2. Remember to contact the Coumadin clinic to let them know that you're starting antibiotics for the next 2 weeks. 3. If her sinus congestion has not completely improved after 2 weeks of antibiotic refill the medication for an additional 2 weeks. However if you're not getting any better call and let me know. 4. We will repeat breathing tests at your next appointment. 5. I will see you back in 3 months or sooner if necessary.  TESTS ORDERED: 1. Maxillofacial/sinus CT- to be done in [place]. 2. Spirometry with bronchodilator challenge and DLCO at next appointment. 3. DEXA scan to be done in [place]

## 2015-01-26 LAB — ALPHA-1 ANTITRYPSIN PHENOTYPE: A1 ANTITRYPSIN: 138 mg/dL (ref 83–199)

## 2015-01-28 ENCOUNTER — Encounter (HOSPITAL_BASED_OUTPATIENT_CLINIC_OR_DEPARTMENT_OTHER): Payer: Medicare Other

## 2015-01-28 VITALS — BP 135/65 | HR 57 | Temp 98.0°F | Resp 18

## 2015-01-28 DIAGNOSIS — D509 Iron deficiency anemia, unspecified: Secondary | ICD-10-CM

## 2015-01-28 MED ORDER — NA FERRIC GLUC CPLX IN SUCROSE 12.5 MG/ML IV SOLN
125.0000 mg | Freq: Once | INTRAVENOUS | Status: AC
  Administered 2015-01-28: 125 mg via INTRAVENOUS
  Filled 2015-01-28: qty 10

## 2015-01-28 MED ORDER — SODIUM CHLORIDE 0.9 % IJ SOLN
10.0000 mL | Freq: Once | INTRAMUSCULAR | Status: AC
  Administered 2015-01-28: 10 mL via INTRAVENOUS

## 2015-01-28 MED ORDER — SODIUM CHLORIDE 0.9 % IV SOLN
INTRAVENOUS | Status: DC
  Administered 2015-01-28: 14:00:00 via INTRAVENOUS

## 2015-01-28 NOTE — Patient Instructions (Signed)
[facility] at [facility] Discharge Instructions  RECOMMENDATIONS MADE BY THE CONSULTANT AND ANY TEST RESULTS WILL BE SENT TO YOUR REFERRING PHYSICIAN.  Ferric gluconate 125 mg iron infusion given today as ordered. Return as scheduled.  Thank you for choosing [facility] at [facility] to provide your oncology and hematology care.  To afford each patient quality time with our provider, please arrive at least 15 minutes before your scheduled appointment time.   Beginning January 23rd 2017 lab work for the Cancer Center will be done in the  Main lab at [facility] on 1st floor. If you have a lab appointment with the Cancer Center please come in thru the  Main Entrance and check in at the main information desk  You need to re-schedule your appointment should you arrive 10 or more minutes late.  We strive to give you quality time with our providers, and arriving late affects you and other patients whose appointments are after yours.  Also, if you no show three or more times for appointments you may be dismissed from the clinic at the providers discretion.     Again, thank you for choosing [facility].  Our hope is that these requests will decrease the amount of time that you wait before being seen by our physicians.       _____________________________________________________________  Should you have questions after your visit to [facility], please contact our office at [phone] between the hours of 8:30 a.m. and 4:30 p.m.  Voicemails left after 4:30 p.m. will not be returned until the following business day.  For prescription refill requests, have your pharmacy contact our office.    

## 2015-01-28 NOTE — Progress Notes (Signed)
Tolerated iron infusion well. Ambulatory on discharge home to self. 

## 2015-01-30 ENCOUNTER — Ambulatory Visit (HOSPITAL_COMMUNITY)
Admission: RE | Admit: 2015-01-30 | Discharge: 2015-01-30 | Disposition: A | Discharge status: Home / Self Care | Payer: Medicare Other | Source: Ambulatory Visit | Attending: Pulmonary Disease | Admitting: Pulmonary Disease

## 2015-01-30 DIAGNOSIS — J324 Chronic pansinusitis: Secondary | ICD-10-CM | POA: Insufficient documentation

## 2015-01-30 DIAGNOSIS — Z7952 Long term (current) use of systemic steroids: Secondary | ICD-10-CM | POA: Diagnosis not present

## 2015-01-30 DIAGNOSIS — C8190 Hodgkin lymphoma, unspecified, unspecified site: Secondary | ICD-10-CM | POA: Insufficient documentation

## 2015-01-30 DIAGNOSIS — M810 Age-related osteoporosis without current pathological fracture: Secondary | ICD-10-CM | POA: Diagnosis not present

## 2015-01-30 DIAGNOSIS — Z780 Asymptomatic menopausal state: Secondary | ICD-10-CM | POA: Diagnosis not present

## 2015-01-31 ENCOUNTER — Telehealth: Payer: Self-pay | Admitting: Pulmonary Disease

## 2015-01-31 NOTE — Telephone Encounter (Signed)
Spoke with patient regarding CT result & DEXA result. CT shows acute on chronic sinusitis without bony erosion. Patient reports drainage and congestion markedly improving on antibiotic therapy. DEXA consistent with Osteoporosis. Plan to send report to her PCP's office and instructed her to address this with him at follow-up since she is on chronic Prednisone therapy.

## 2015-02-03 IMAGING — CR DG CHEST 2V
2 series · 2 of 2 positions shown · non-contrast
Comparison: 11/05/2012

CLINICAL DATA: Cough

EXAM:
CHEST  2 VIEW

[view not recorded (1 of 2)]
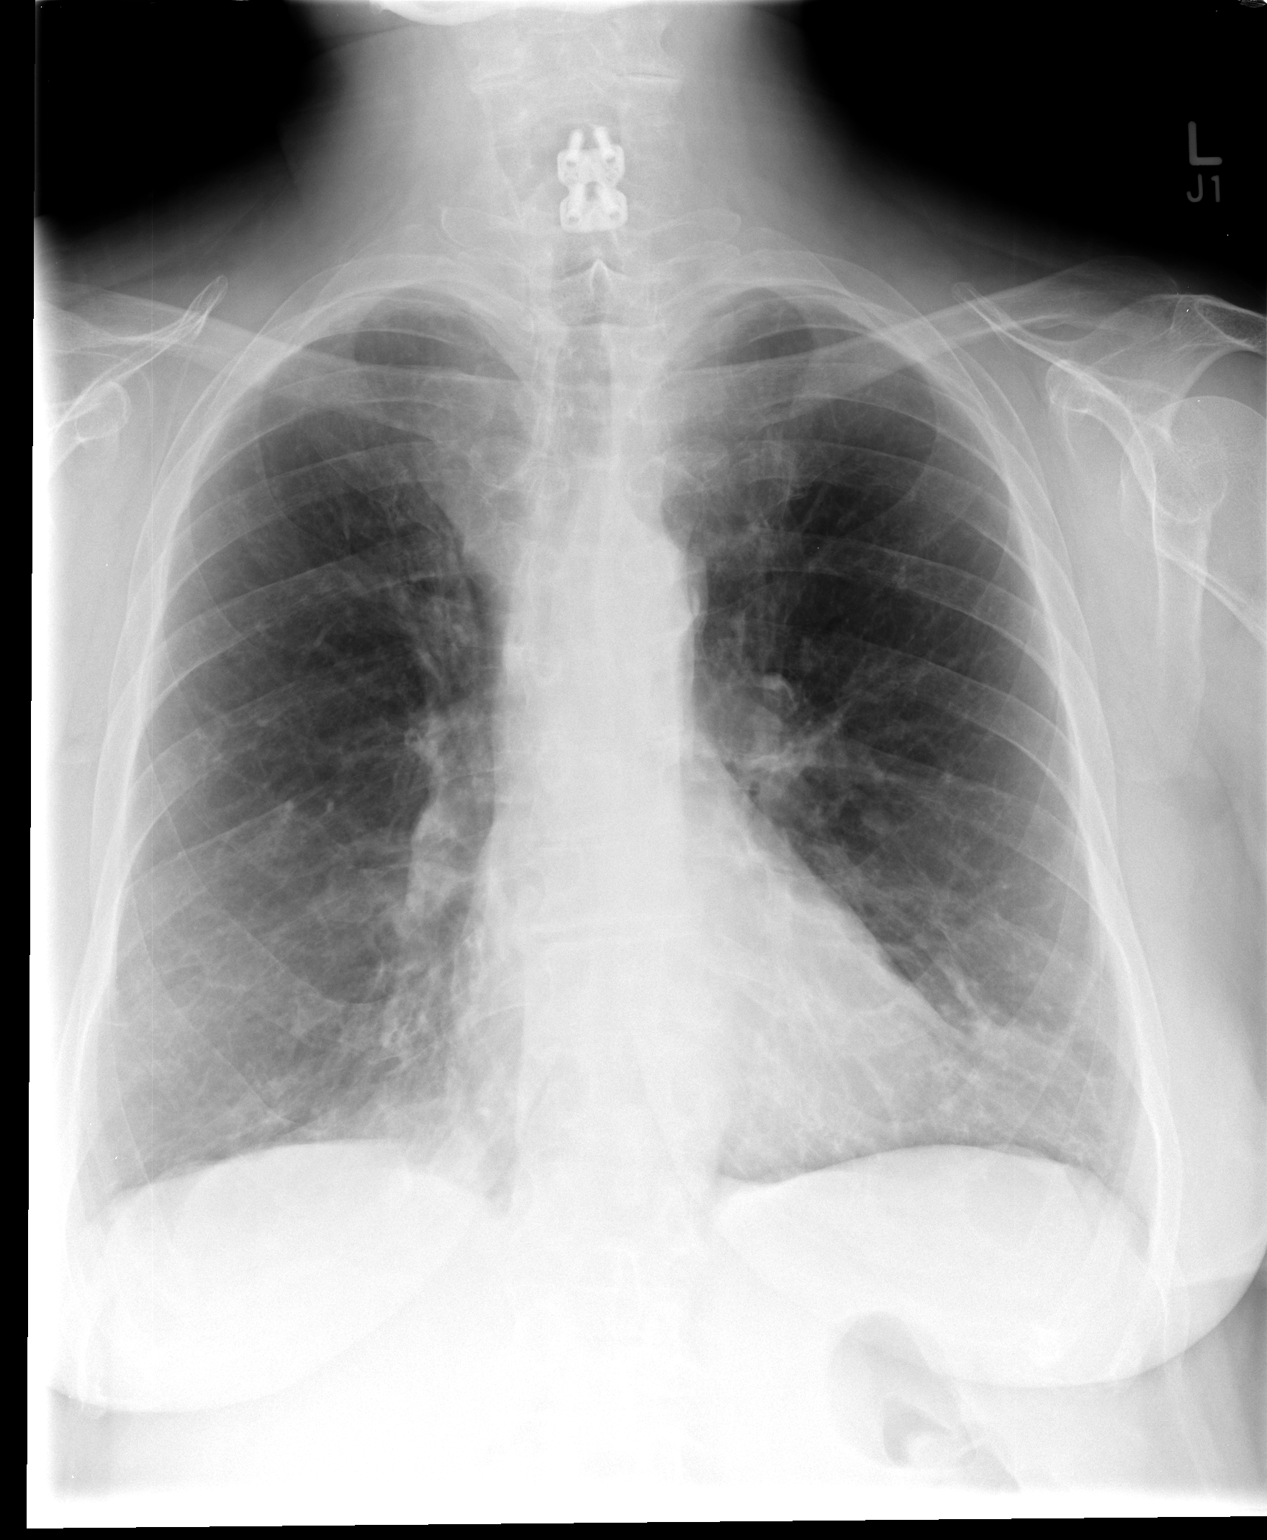

[view not recorded (2 of 2)]
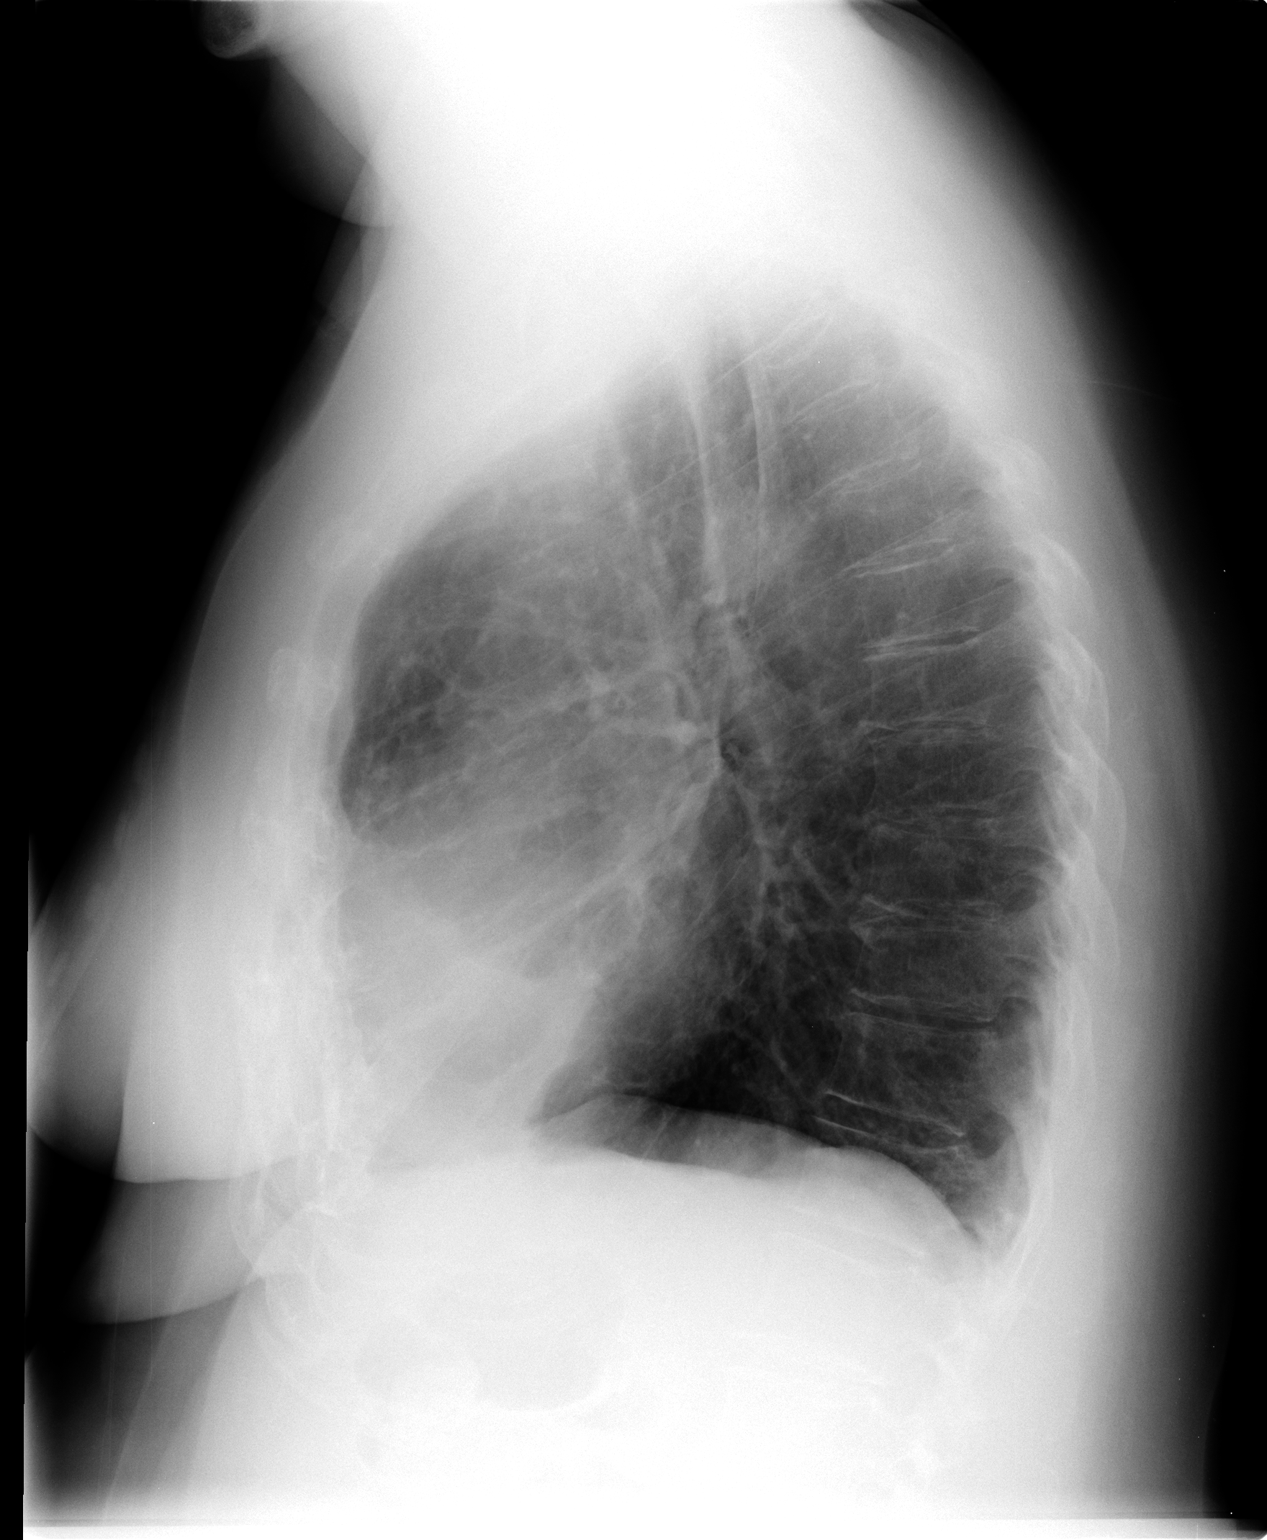

[2 of 2 positions shown; findings below may reference images not displayed]

FINDINGS: Mild cardiomegaly. No pleural effusion. No pneumothorax. Clear
lungs.
IMPRESSION: No active cardiopulmonary disease.

## 2015-02-05 ENCOUNTER — Encounter (HOSPITAL_COMMUNITY): Payer: Medicare Other | Attending: Hematology and Oncology

## 2015-02-05 ENCOUNTER — Ambulatory Visit (HOSPITAL_COMMUNITY): Payer: Medicare Other

## 2015-02-05 VITALS — BP 125/76 | HR 68 | Temp 98.0°F | Resp 18

## 2015-02-05 DIAGNOSIS — C8590 Non-Hodgkin lymphoma, unspecified, unspecified site: Secondary | ICD-10-CM | POA: Insufficient documentation

## 2015-02-05 DIAGNOSIS — D800 Hereditary hypogammaglobulinemia: Secondary | ICD-10-CM | POA: Insufficient documentation

## 2015-02-05 DIAGNOSIS — D509 Iron deficiency anemia, unspecified: Secondary | ICD-10-CM | POA: Diagnosis not present

## 2015-02-05 DIAGNOSIS — D801 Nonfamilial hypogammaglobulinemia: Secondary | ICD-10-CM | POA: Insufficient documentation

## 2015-02-05 DIAGNOSIS — D869 Sarcoidosis, unspecified: Secondary | ICD-10-CM | POA: Insufficient documentation

## 2015-02-05 MED ORDER — SODIUM CHLORIDE 0.9 % IV SOLN
125.0000 mg | Freq: Once | INTRAVENOUS | Status: AC
  Administered 2015-02-05: 125 mg via INTRAVENOUS
  Filled 2015-02-05: qty 10

## 2015-02-05 MED ORDER — SODIUM CHLORIDE 0.9 % IV SOLN
INTRAVENOUS | Status: DC
  Administered 2015-02-05: 15:00:00 via INTRAVENOUS

## 2015-02-05 MED ORDER — SODIUM CHLORIDE 0.9% FLUSH
10.0000 mL | Freq: Once | INTRAVENOUS | Status: AC
  Administered 2015-02-05: 10 mL via INTRAVENOUS

## 2015-02-05 NOTE — Patient Instructions (Signed)
..  [place] at [facility] Discharge Instructions  RECOMMENDATIONS MADE BY THE CONSULTANT AND ANY TEST RESULTS WILL BE SENT TO YOUR REFERRING PHYSICIAN.  Iron infusion today IVIG as scheduled  Thank you for choosing [place] at [facility] to provide your oncology and hematology care.  To afford each patient quality time with our provider, please arrive at least 15 minutes before your scheduled appointment time.   Beginning January 23rd 2017 lab work for the [facility] will be done in the  Main lab at [facility] on 1st floor. If you have a lab appointment with the [facility] please come in thru the  Main Entrance and check in at the main information desk  You need to re-schedule your appointment should you arrive 10 or more minutes late.  We strive to give you quality time with our providers, and arriving late affects you and other patients whose appointments are after yours.  Also, if you no show three or more times for appointments you may be dismissed from the clinic at the providers discretion.     Again, thank you for choosing [facility].  Our hope is that these requests will decrease the amount of time that you wait before being seen by our physicians.       _____________________________________________________________  Should you have questions after your visit to [facility], please contact our office at [phone] between the hours of 8:30 a.m. and 4:30 p.m.  Voicemails left after 4:30 p.m. will not be returned until the following business day.  For prescription refill requests, have your pharmacy contact our office.

## 2015-02-11 DIAGNOSIS — D485 Neoplasm of uncertain behavior of skin: Secondary | ICD-10-CM | POA: Diagnosis not present

## 2015-02-11 DIAGNOSIS — L818 Other specified disorders of pigmentation: Secondary | ICD-10-CM | POA: Diagnosis not present

## 2015-02-11 DIAGNOSIS — L570 Actinic keratosis: Secondary | ICD-10-CM | POA: Diagnosis not present

## 2015-02-11 DIAGNOSIS — D225 Melanocytic nevi of trunk: Secondary | ICD-10-CM | POA: Diagnosis not present

## 2015-02-14 IMAGING — PT NM PET TUM IMG RESTAG (PS) SKULL BASE T - THIGH
1 of 8 series · 1 of 25 positions shown · non-contrast
Comparison: CT CHEST ABDOMEN PELVIS 11/09/2012 AND PET 01/17/2012.

CLINICAL DATA: SUBSEQUENT treatment strategy for NON-HODGKIN'S
LYMPHOMA.

EXAM:
NUCLEAR MEDICINE PET SKULL BASE TO THIGH
TECHNIQUE: 9.9 mCi F-18 FDG was injected intravenously. Full-ring PET imaging
was performed from the skull base to thigh after the radiotracer. CT
data was obtained and used for attenuation correction and anatomic
localization.
FASTING BLOOD GLUCOSE:  Value: 81 mg/dl

[Series 4: ct sk_thigh 5.0 b31f · axial · 5.0mm · 0.98mm/px · 1 of 202 slices shown]
[im 202/202  brain]
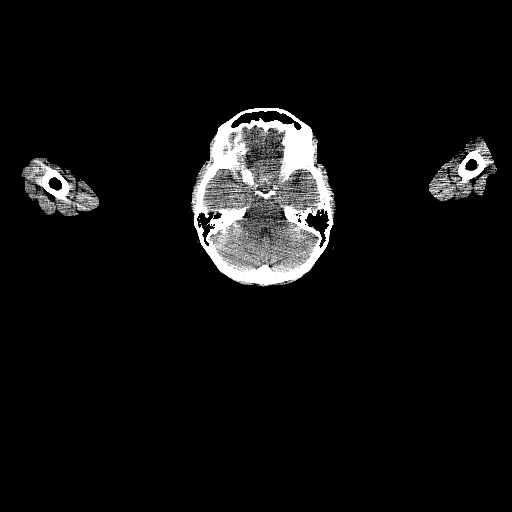

[1 of 25 positions shown; findings below may reference images not displayed]

FINDINGS: NECK

Mild asymmetric left or pharyngeal activity (PET image 23) is likely
physiologic. No abnormal hypermetabolism in the neck. CT images show
air-fluid levels in the paranasal sinuses.

CHEST

No hypermetabolic mediastinal or hilar lymph nodes. An 8 mm nodule
in the left lower lobe (CT series 6, image 43) has an SUV max of
2.0. No additional areas of abnormal hypermetabolism in the chest.

CT images show atherosclerotic calcification of the arterial
vasculature. Extensive collateral vascularity is seen along the
subcutaneous chest wall. No pericardial or pleural effusion.
Scattered peribronchovascular nodularity, bronchial thickening and
bronchiectasis with mild architectural distortion, as before.

ABDOMEN/PELVIS

There are a few scattered hazy subcutaneous nodules, measuring up to
1.4 cm along the right lateral abdominal wall (CT series 4, image
132) with an SUV max of 3.0 (PET image 132). No abnormal
hypermetabolism in the liver, adrenal glands, spleen or pancreas. No
hypermetabolic lymph nodes.

CT images shows the liver, gallbladder, adrenal glands and right
kidney to be grossly unremarkable. Left kidney appears somewhat
atrophic. Spleen, pancreas, stomach and bowel are grossly
unremarkable. Ovaries are visualized. The uterus appears absent. No
free fluid.

SKELETON

No abnormal osseous hypermetabolism. Degenerative changes are seen
in the spine.
IMPRESSION: 1. Hypermetabolic left lower lobe nodule. Lesion is new from
11/09/2012 and may be inflammatory, given surrounding pulmonary
parenchymal changes suggestive of post infectious scarring or
mycobacterium avium complex (MIRAC). However, adenocarcinoma cannot be
definitively excluded. Recommend CT chest without contrast in 3
months in further evaluation.
2. A few scattered mildly hypermetabolic hazy subcutaneous nodules
are stable.
3. Air-fluid levels in the paranasal sinuses.

## 2015-02-18 ENCOUNTER — Other Ambulatory Visit: Payer: Self-pay | Admitting: Adult Health

## 2015-02-18 DIAGNOSIS — Z1231 Encounter for screening mammogram for malignant neoplasm of breast: Secondary | ICD-10-CM

## 2015-02-19 ENCOUNTER — Encounter (HOSPITAL_COMMUNITY): Payer: Medicare Other

## 2015-02-19 ENCOUNTER — Encounter (HOSPITAL_BASED_OUTPATIENT_CLINIC_OR_DEPARTMENT_OTHER): Payer: Medicare Other

## 2015-02-19 VITALS — BP 152/91 | HR 58 | Temp 98.0°F | Resp 18

## 2015-02-19 DIAGNOSIS — C8338 Diffuse large B-cell lymphoma, lymph nodes of multiple sites: Secondary | ICD-10-CM

## 2015-02-19 DIAGNOSIS — D869 Sarcoidosis, unspecified: Secondary | ICD-10-CM | POA: Diagnosis not present

## 2015-02-19 DIAGNOSIS — D801 Nonfamilial hypogammaglobulinemia: Secondary | ICD-10-CM

## 2015-02-19 DIAGNOSIS — D800 Hereditary hypogammaglobulinemia: Secondary | ICD-10-CM | POA: Diagnosis not present

## 2015-02-19 DIAGNOSIS — C8590 Non-Hodgkin lymphoma, unspecified, unspecified site: Secondary | ICD-10-CM | POA: Diagnosis not present

## 2015-02-19 LAB — COMPREHENSIVE METABOLIC PANEL
ALBUMIN: 3.8 g/dL (ref 3.5–5.0)
ALK PHOS: 103 U/L (ref 38–126)
ALT: 14 U/L (ref 14–54)
ANION GAP: 9 (ref 5–15)
AST: 19 U/L (ref 15–41)
BUN: 13 mg/dL (ref 6–20)
CHLORIDE: 102 mmol/L (ref 101–111)
CO2: 31 mmol/L (ref 22–32)
CREATININE: 1.05 mg/dL — AB (ref 0.44–1.00)
Calcium: 8.6 mg/dL — ABNORMAL LOW (ref 8.9–10.3)
GFR calc Af Amer: >60 mL/min (ref >60)
GFR calc non Af Amer: 57 mL/min — ABNORMAL LOW (ref >60)
GLUCOSE: 98 mg/dL (ref 65–99)
POTASSIUM: 3.9 mmol/L (ref 3.5–5.1)
SODIUM: 142 mmol/L (ref 135–145)
TOTAL PROTEIN: 6.8 g/dL (ref 6.5–8.1)
Total Bilirubin: 0.4 mg/dL (ref 0.3–1.2)

## 2015-02-19 LAB — CBC WITH DIFFERENTIAL/PLATELET
BASOS PCT: 0 %
Basophils Absolute: 0 10*3/uL (ref 0.0–0.1)
EOS ABS: 0.1 10*3/uL (ref 0.0–0.7)
EOS PCT: 1 %
HCT: 37.3 % (ref 36.0–46.0)
HEMOGLOBIN: 12.1 g/dL (ref 12.0–15.0)
Lymphocytes Relative: 10 %
Lymphs Abs: 0.8 10*3/uL (ref 0.7–4.0)
MCH: 30.1 pg (ref 26.0–34.0)
MCHC: 32.4 g/dL (ref 30.0–36.0)
MCV: 92.8 fL (ref 78.0–100.0)
MONOS PCT: 4 %
Monocytes Absolute: 0.3 10*3/uL (ref 0.1–1.0)
NEUTROS PCT: 85 %
Neutro Abs: 6.8 10*3/uL (ref 1.7–7.7)
PLATELETS: 143 10*3/uL — AB (ref 150–400)
RBC: 4.02 MIL/uL (ref 3.87–5.11)
RDW: 15.3 % (ref 11.5–15.5)
WBC: 7.9 10*3/uL (ref 4.0–10.5)

## 2015-02-19 MED ORDER — SODIUM CHLORIDE 0.9 % IV SOLN
Freq: Once | INTRAVENOUS | Status: AC
  Administered 2015-02-19: 11:00:00 via INTRAVENOUS

## 2015-02-19 MED ORDER — SODIUM CHLORIDE 0.9 % IJ SOLN: 10.0000 mL | INTRAMUSCULAR | Status: DC | PRN

## 2015-02-19 MED ORDER — IMMUNE GLOBULIN (HUMAN) 10 GM/100ML IV SOLN: 50.0000 g | Freq: Once | INTRAVENOUS | Status: DC

## 2015-02-19 MED ORDER — IMMUNE GLOBULIN (HUMAN) 10 GM/100ML IV SOLN
50.0000 g | Freq: Once | INTRAVENOUS | Status: AC
  Administered 2015-02-19: 50 g via INTRAVENOUS
  Filled 2015-02-19: qty 100

## 2015-02-19 NOTE — Progress Notes (Signed)
Patient reports she took tylenol and benadryl at home this morning prior to today's infusion.  Tolerated infusion well. Ambulatory on discharge home to self.

## 2015-02-19 NOTE — Patient Instructions (Signed)
[place] at [facility] Discharge Instructions  RECOMMENDATIONS MADE BY THE CONSULTANT AND ANY TEST RESULTS WILL BE SENT TO YOUR REFERRING PHYSICIAN.  IVIG infusion given today as ordered. Return as scheduled.  Thank you for choosing [place] at [facility] to provide your oncology and hematology care.  To afford each patient quality time with our provider, please arrive at least 15 minutes before your scheduled appointment time.   Beginning January 23rd 2017 lab work for the [facility] will be done in the  Main lab at [facility] on 1st floor. If you have a lab appointment with the [facility] please come in thru the  Main Entrance and check in at the main information desk  You need to re-schedule your appointment should you arrive 10 or more minutes late.  We strive to give you quality time with our providers, and arriving late affects you and other patients whose appointments are after yours.  Also, if you no show three or more times for appointments you may be dismissed from the clinic at the providers discretion.     Again, thank you for choosing [facility].  Our hope is that these requests will decrease the amount of time that you wait before being seen by our physicians.       _____________________________________________________________  Should you have questions after your visit to [facility], please contact our office at [phone] between the hours of 8:30 a.m. and 4:30 p.m.  Voicemails left after 4:30 p.m. will not be returned until the following business day.  For prescription refill requests, have your pharmacy contact our office.

## 2015-02-20 ENCOUNTER — Ambulatory Visit (HOSPITAL_COMMUNITY): Payer: Medicare Other

## 2015-02-20 ENCOUNTER — Other Ambulatory Visit (HOSPITAL_COMMUNITY): Payer: Medicare Other

## 2015-02-20 LAB — IGG, IGA, IGM
IgA: 52 mg/dL — ABNORMAL LOW (ref 87–352)
IgG (Immunoglobin G), Serum: 828 mg/dL (ref 700–1600)
IgM, Serum: 61 mg/dL (ref 26–217)

## 2015-02-26 ENCOUNTER — Ambulatory Visit (HOSPITAL_COMMUNITY)
Admission: RE | Admit: 2015-02-26 | Discharge: 2015-02-26 | Disposition: A | Discharge status: Home / Self Care | Payer: Medicare Other | Source: Ambulatory Visit | Attending: Adult Health | Admitting: Adult Health

## 2015-02-26 ENCOUNTER — Other Ambulatory Visit: Payer: Self-pay | Admitting: Adult Health

## 2015-02-26 ENCOUNTER — Ambulatory Visit (HOSPITAL_COMMUNITY): Payer: Medicare Other

## 2015-02-26 DIAGNOSIS — Z1231 Encounter for screening mammogram for malignant neoplasm of breast: Secondary | ICD-10-CM | POA: Insufficient documentation

## 2015-03-06 DIAGNOSIS — M79675 Pain in left toe(s): Secondary | ICD-10-CM | POA: Diagnosis not present

## 2015-03-06 DIAGNOSIS — L851 Acquired keratosis [keratoderma] palmaris et plantaris: Secondary | ICD-10-CM | POA: Diagnosis not present

## 2015-03-06 DIAGNOSIS — M79674 Pain in right toe(s): Secondary | ICD-10-CM | POA: Diagnosis not present

## 2015-03-12 DIAGNOSIS — L988 Other specified disorders of the skin and subcutaneous tissue: Secondary | ICD-10-CM | POA: Diagnosis not present

## 2015-03-12 DIAGNOSIS — D485 Neoplasm of uncertain behavior of skin: Secondary | ICD-10-CM | POA: Diagnosis not present

## 2015-03-18 ENCOUNTER — Encounter (HOSPITAL_COMMUNITY): Payer: Medicare Other

## 2015-03-18 ENCOUNTER — Encounter (HOSPITAL_COMMUNITY): Payer: Medicare Other | Attending: Hematology and Oncology

## 2015-03-18 VITALS — BP 152/73 | HR 53 | Temp 97.7°F | Resp 20

## 2015-03-18 DIAGNOSIS — D801 Nonfamilial hypogammaglobulinemia: Secondary | ICD-10-CM | POA: Diagnosis not present

## 2015-03-18 DIAGNOSIS — D869 Sarcoidosis, unspecified: Secondary | ICD-10-CM | POA: Diagnosis not present

## 2015-03-18 DIAGNOSIS — C8590 Non-Hodgkin lymphoma, unspecified, unspecified site: Secondary | ICD-10-CM | POA: Insufficient documentation

## 2015-03-18 DIAGNOSIS — C8338 Diffuse large B-cell lymphoma, lymph nodes of multiple sites: Secondary | ICD-10-CM

## 2015-03-18 DIAGNOSIS — D800 Hereditary hypogammaglobulinemia: Secondary | ICD-10-CM | POA: Insufficient documentation

## 2015-03-18 LAB — COMPREHENSIVE METABOLIC PANEL
ALBUMIN: 4 g/dL (ref 3.5–5.0)
ALK PHOS: 100 U/L (ref 38–126)
ALT: 17 U/L (ref 14–54)
ANION GAP: 7 (ref 5–15)
AST: 23 U/L (ref 15–41)
BILIRUBIN TOTAL: 0.5 mg/dL (ref 0.3–1.2)
BUN: 17 mg/dL (ref 6–20)
CALCIUM: 8.7 mg/dL — AB (ref 8.9–10.3)
CO2: 29 mmol/L (ref 22–32)
Chloride: 103 mmol/L (ref 101–111)
Creatinine, Ser: 1.34 mg/dL — ABNORMAL HIGH (ref 0.44–1.00)
GFR calc Af Amer: 49 mL/min — ABNORMAL LOW (ref >60)
GFR, EST NON AFRICAN AMERICAN: 42 mL/min — AB (ref >60)
Glucose, Bld: 107 mg/dL — ABNORMAL HIGH (ref 65–99)
POTASSIUM: 4 mmol/L (ref 3.5–5.1)
Sodium: 139 mmol/L (ref 135–145)
TOTAL PROTEIN: 7 g/dL (ref 6.5–8.1)

## 2015-03-18 LAB — CBC WITH DIFFERENTIAL/PLATELET
BASOS ABS: 0 10*3/uL (ref 0.0–0.1)
Basophils Relative: 0 %
EOS ABS: 0.1 10*3/uL (ref 0.0–0.7)
EOS PCT: 2 %
HCT: 40.6 % (ref 36.0–46.0)
Hemoglobin: 13 g/dL (ref 12.0–15.0)
LYMPHS PCT: 11 %
Lymphs Abs: 0.9 10*3/uL (ref 0.7–4.0)
MCH: 30.2 pg (ref 26.0–34.0)
MCHC: 32 g/dL (ref 30.0–36.0)
MCV: 94.2 fL (ref 78.0–100.0)
Monocytes Absolute: 0.4 10*3/uL (ref 0.1–1.0)
Monocytes Relative: 6 %
Neutro Abs: 6.1 10*3/uL (ref 1.7–7.7)
Neutrophils Relative %: 81 %
PLATELETS: 141 10*3/uL — AB (ref 150–400)
RBC: 4.31 MIL/uL (ref 3.87–5.11)
RDW: 14.6 % (ref 11.5–15.5)
WBC: 7.6 10*3/uL (ref 4.0–10.5)

## 2015-03-18 MED ORDER — SODIUM CHLORIDE 0.9 % IV SOLN
Freq: Once | INTRAVENOUS | Status: AC
  Administered 2015-03-18: 11:00:00 via INTRAVENOUS

## 2015-03-18 MED ORDER — DIPHENHYDRAMINE HCL 25 MG PO CAPS: 25.0000 mg | ORAL_CAPSULE | Freq: Once | ORAL | Status: DC

## 2015-03-18 MED ORDER — IMMUNE GLOBULIN (HUMAN) 10 GM/100ML IV SOLN: 50.0000 g | Freq: Once | INTRAVENOUS | Status: DC

## 2015-03-18 MED ORDER — SODIUM CHLORIDE 0.9 % IJ SOLN
10.0000 mL | INTRAMUSCULAR | Status: DC | PRN
  Administered 2015-03-18: 10 mL
  Filled 2015-03-18: qty 10

## 2015-03-18 MED ORDER — ACETAMINOPHEN 325 MG PO TABS: 650.0000 mg | ORAL_TABLET | Freq: Four times a day (QID) | ORAL | Status: DC | PRN

## 2015-03-18 MED ORDER — IMMUNE GLOBULIN (HUMAN) 20 GM/200ML IV SOLN
50.0000 g | Freq: Once | INTRAVENOUS | Status: AC
  Administered 2015-03-18: 50 g via INTRAVENOUS
  Filled 2015-03-18: qty 100

## 2015-03-18 NOTE — Patient Instructions (Signed)
[place] at [facility] Discharge Instructions  RECOMMENDATIONS MADE BY THE CONSULTANT AND ANY TEST RESULTS WILL BE SENT TO YOUR REFERRING PHYSICIAN.  IVIG infusion given as ordered.  Thank you for choosing [place] at [facility] to provide your oncology and hematology care.  To afford each patient quality time with our provider, please arrive at least 15 minutes before your scheduled appointment time.   Beginning January 23rd 2017 lab work for the [facility] will be done in the  Main lab at [facility] on 1st floor. If you have a lab appointment with the [facility] please come in thru the  Main Entrance and check in at the main information desk  You need to re-schedule your appointment should you arrive 10 or more minutes late.  We strive to give you quality time with our providers, and arriving late affects you and other patients whose appointments are after yours.  Also, if you no show three or more times for appointments you may be dismissed from the clinic at the providers discretion.     Again, thank you for choosing [facility].  Our hope is that these requests will decrease the amount of time that you wait before being seen by our physicians.       _____________________________________________________________  Should you have questions after your visit to [facility], please contact our office at [phone] between the hours of 8:30 a.m. and 4:30 p.m.  Voicemails left after 4:30 p.m. will not be returned until the following business day.  For prescription refill requests, have your pharmacy contact our office.         Resources For Cancer Patients and their Caregivers ? American Cancer Society: Can assist with transportation, wigs, general needs, runs Look Good Feel Better.        (612)046-8630 ? Cancer Care: Provides financial assistance, online support groups, medication/co-pay assistance.   1-800-813-HOPE (667)327-5229) ? [facility] Assists [place] cancer patients and their families through emotional , educational and financial support.  7168599895 ? [place] DSS Where to apply for food stamps, Medicaid and utility assistance. (614)763-2910 ? [facility]: Transportation to medical appointments. 425-306-7619 ? Social Security Administration: May apply for disability if have a Stage IV cancer. (316)154-0563 863-467-3639 ? [employer], Disability and Transit Services: Assists with nutrition, care and transit needs. 705-779-6368

## 2015-03-18 NOTE — Progress Notes (Signed)
Patient reports taking tylenol and benadryl at home prior to today's appointment.  Tolerated IVIG infusion well. Ambulatory on discharge home to self.

## 2015-03-19 LAB — IGG, IGA, IGM
IGA: 37 mg/dL — AB (ref 87–352)
IGM, SERUM: 60 mg/dL (ref 26–217)
IgG (Immunoglobin G), Serum: 899 mg/dL (ref 700–1600)

## 2015-03-21 IMAGING — MG MM DIGITAL SCREENING
4 series · 4 of 4 positions shown · non-contrast
Comparison: Previous exam(s)

ACR Breast Density Category a: The breast tissue is almost entirely
fatty.

CLINICAL DATA: Screening.

EXAM:
DIGITAL SCREENING BILATERAL MAMMOGRAM WITH CAD

[L CC]
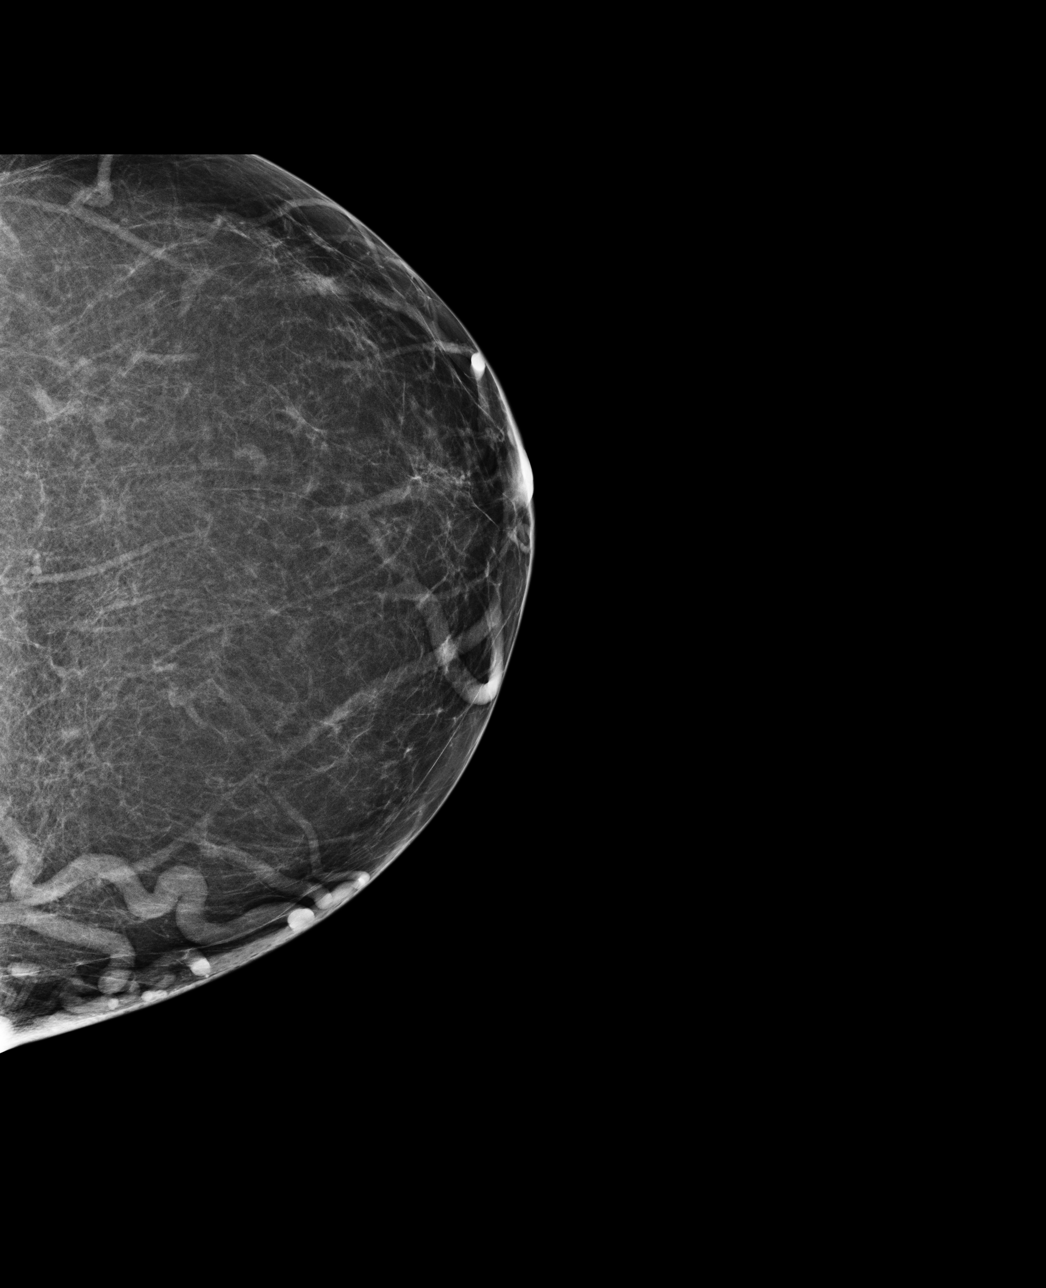

[L MLO]
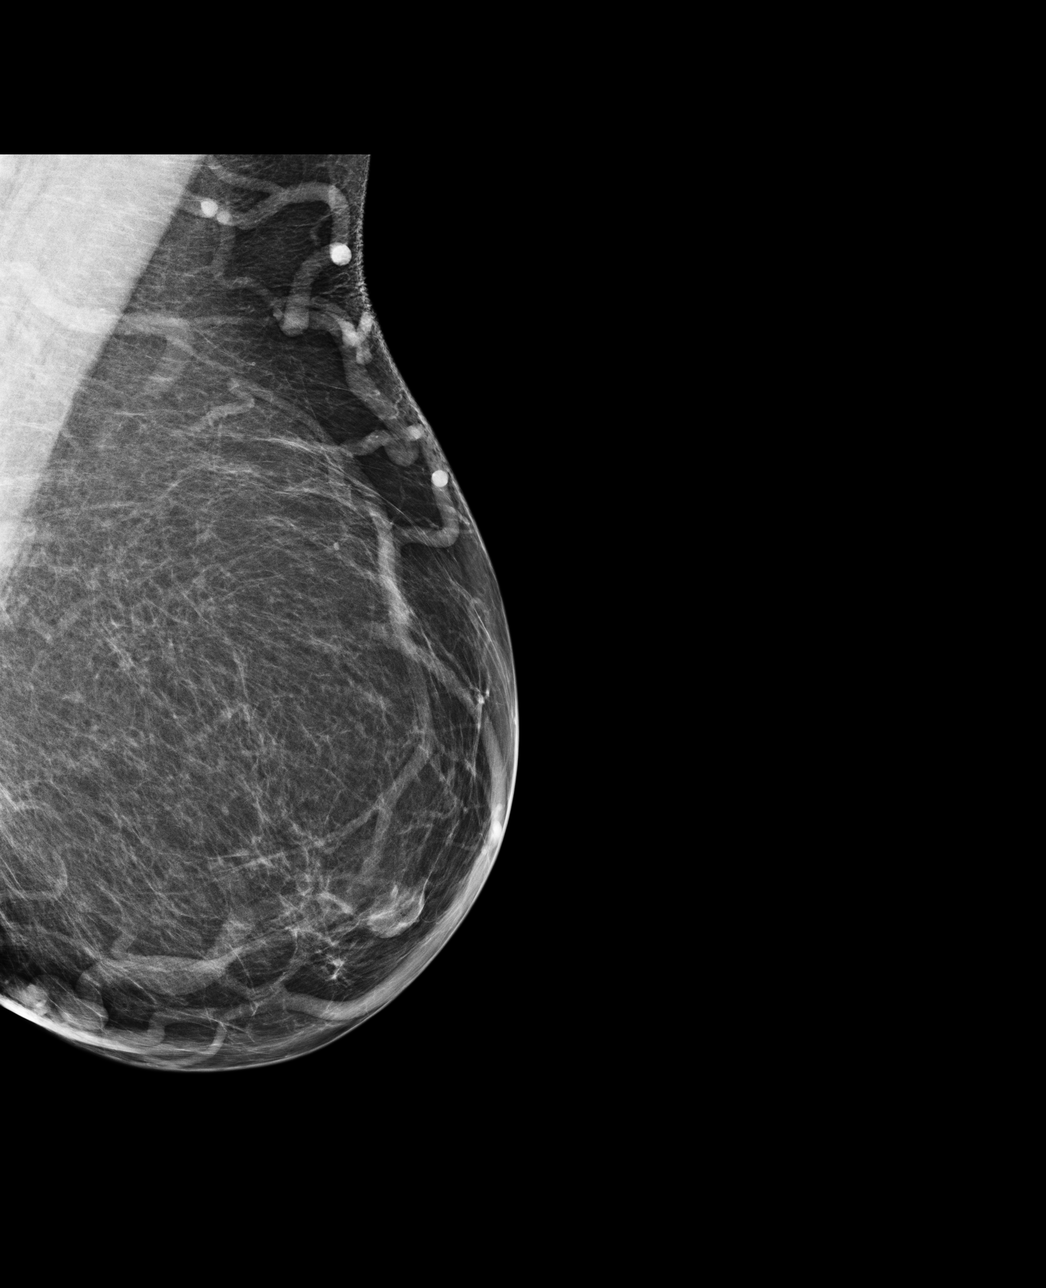

[R CC]
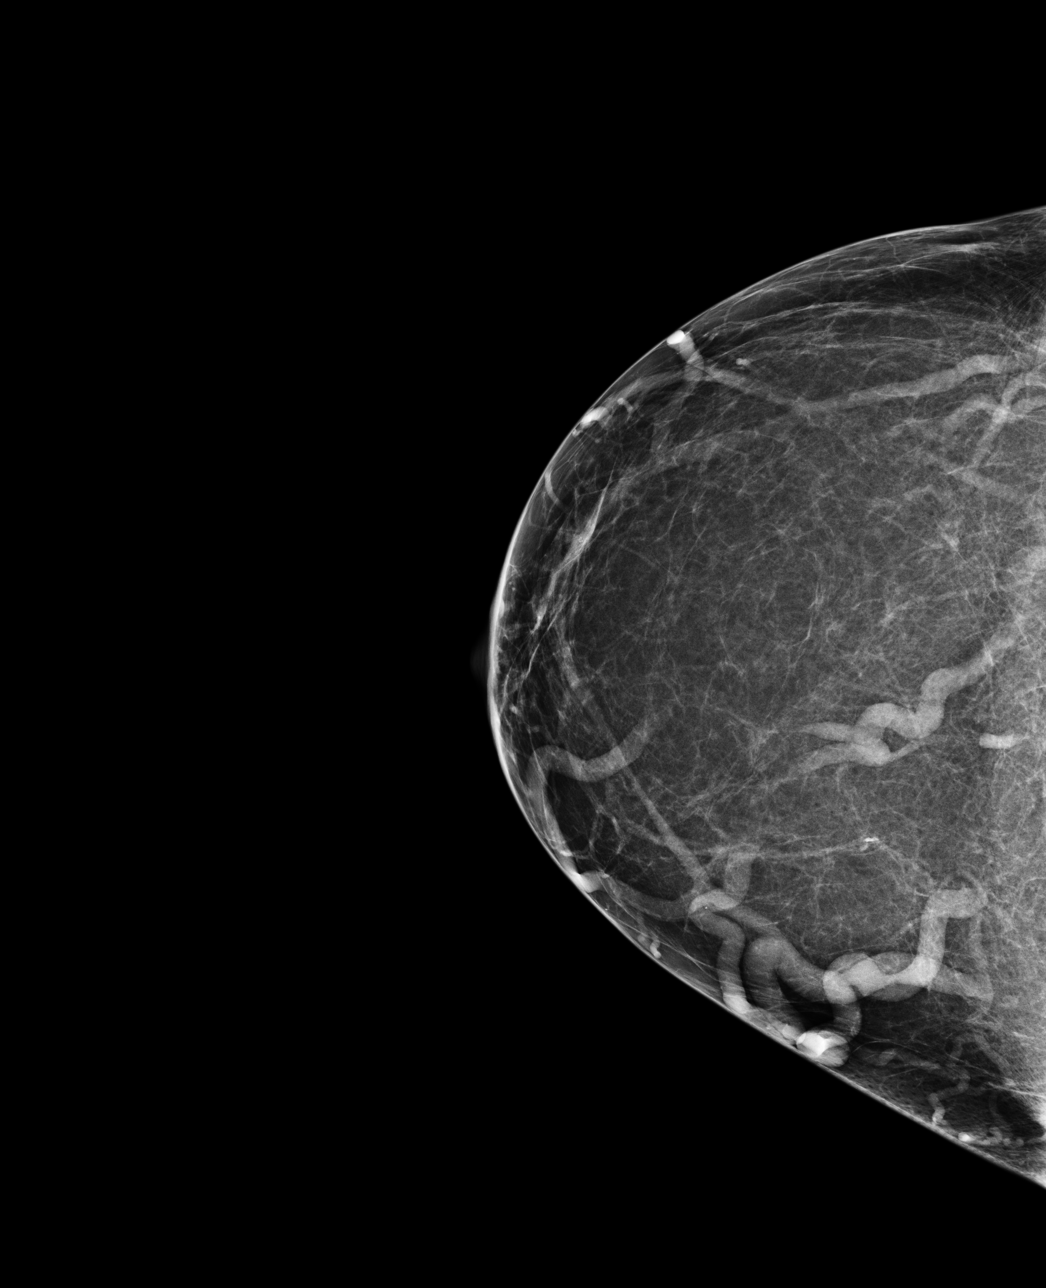

[R MLO]
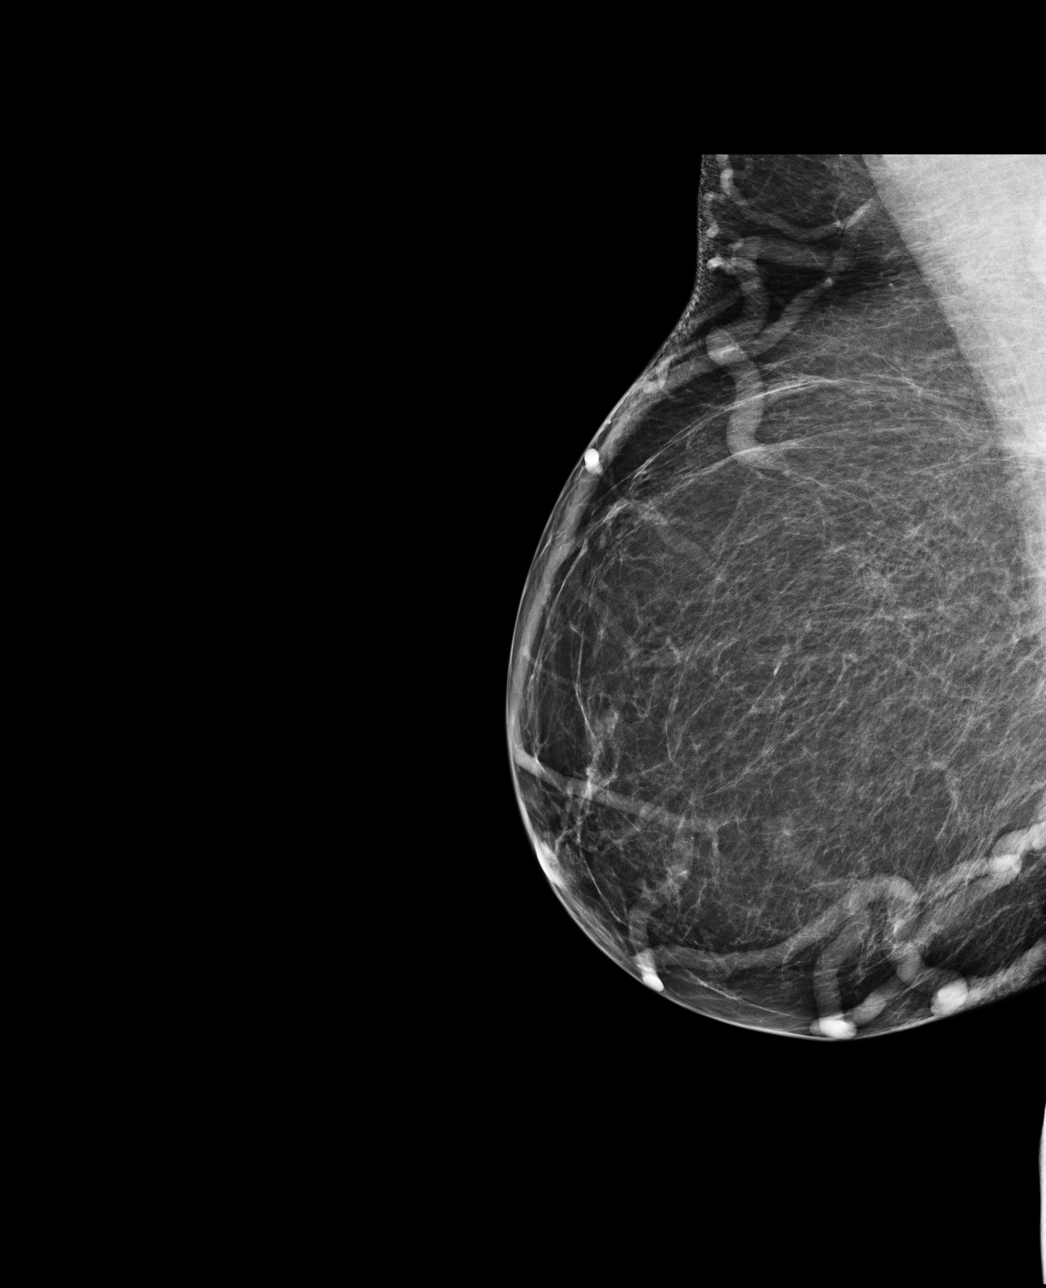

[4 of 4 positions shown; findings below may reference images not displayed]

FINDINGS: There are no findings suspicious for malignancy. Images were
processed with CAD.
IMPRESSION: No mammographic evidence of malignancy. A result letter of this
screening mammogram will be mailed directly to the patient.

RECOMMENDATION:
Screening mammogram in one year. (Code:JV-[id number])

BI-RADS CATEGORY  1: Negative.

## 2015-03-26 DIAGNOSIS — G4733 Obstructive sleep apnea (adult) (pediatric): Secondary | ICD-10-CM | POA: Diagnosis not present

## 2015-03-30 ENCOUNTER — Other Ambulatory Visit: Payer: Self-pay | Admitting: Critical Care Medicine

## 2015-04-03 DIAGNOSIS — H1089 Other conjunctivitis: Secondary | ICD-10-CM | POA: Diagnosis not present

## 2015-04-10 DIAGNOSIS — C8590 Non-Hodgkin lymphoma, unspecified, unspecified site: Secondary | ICD-10-CM | POA: Diagnosis not present

## 2015-04-10 DIAGNOSIS — E785 Hyperlipidemia, unspecified: Secondary | ICD-10-CM | POA: Diagnosis not present

## 2015-04-10 DIAGNOSIS — J45909 Unspecified asthma, uncomplicated: Secondary | ICD-10-CM | POA: Diagnosis not present

## 2015-04-10 DIAGNOSIS — K219 Gastro-esophageal reflux disease without esophagitis: Secondary | ICD-10-CM | POA: Diagnosis not present

## 2015-04-10 DIAGNOSIS — Z79899 Other long term (current) drug therapy: Secondary | ICD-10-CM | POA: Diagnosis not present

## 2015-04-10 DIAGNOSIS — G4733 Obstructive sleep apnea (adult) (pediatric): Secondary | ICD-10-CM | POA: Diagnosis not present

## 2015-04-10 DIAGNOSIS — E119 Type 2 diabetes mellitus without complications: Secondary | ICD-10-CM | POA: Diagnosis not present

## 2015-04-10 DIAGNOSIS — I80209 Phlebitis and thrombophlebitis of unspecified deep vessels of unspecified lower extremity: Secondary | ICD-10-CM | POA: Diagnosis not present

## 2015-04-10 DIAGNOSIS — M792 Neuralgia and neuritis, unspecified: Secondary | ICD-10-CM | POA: Diagnosis not present

## 2015-04-10 DIAGNOSIS — G47419 Narcolepsy without cataplexy: Secondary | ICD-10-CM | POA: Diagnosis not present

## 2015-04-20 ENCOUNTER — Ambulatory Visit (HOSPITAL_COMMUNITY): Payer: Medicare Other

## 2015-04-20 ENCOUNTER — Other Ambulatory Visit (HOSPITAL_COMMUNITY): Payer: Medicare Other

## 2015-04-20 ENCOUNTER — Ambulatory Visit (HOSPITAL_COMMUNITY): Payer: Medicare Other | Admitting: Oncology

## 2015-04-21 NOTE — Progress Notes (Signed)
[name], MD [place] [id number]  Hypogammaglobulinemia, acquired [facility])  Diffuse large B-cell lymphoma of lymph nodes of multiple regions (HCC)  Iron deficiency anemia  B12 deficiency  Narcolepsy without cataplexy  Orofacial dyskinesia  CURRENT THERAPY: 50 g of IVIG monthly for IgG deficiency  INTERVAL HISTORY: [name] 60 y.o. female returns for followup of Hypogammaglobulinemia/IgG deficiency with a significant history of recurrent URI/LRI. AND History of DLBCL, S/P R-CHOP x 4 cycles with IT MTX during cycles 3 and 4.  Treatment was discontinued early due to Pseudomonas sepsis after cycle 2 and 4 (life-threatening).  She finished maintenance Rituxan on 09/30/2011. AND Iron deficiency anemia, newly discovered on 11/14/2014, S/P IV Ferhame 510 mg on 11/20/2014.  Excellent response to IV iron replacement. AND Low-normal B12, with negative anti-parietal cell and intrinsic factor antibodies, started on weekly B12 on 11/20/2014- 12/11/2014 with transition to PO replacement. AND Osteoporosis, managed by primary care provider and on chronic corticosteroid use.  I personally reviewed and went over laboratory results with the patient.  The results are noted within this dictation.  Labs will be updated today as scheduled.  I personally reviewed and went over radiographic studies with the patient.  The results are noted within this dictation.  Mammogram on 02/27/2015 is BIRADS 1.  She is doing well.  She notes that her narcolepsy treatment is helping significantly.  She is to continue follow-up with neurology as directed.  She is being followed by Dr. Ashok Cordia, pulmonology, and was recently prescribed antibiotics after undergoing CT imaging of maxillofacial area showing acute on chronic sinusitis.  She note a recurrence of symptoms and plans on refilling her antibiotic that was prescribed.  She notes a 2-3 month history of numbness and tingling  of her mouth, interior and exterior, with tongue movement abnormalities.  At first she thought it was dry mouth.  So she has been using Biotene for dry mouth for a few weeks without improvement.  "It's almost like a tic."  She is seen during exam maneuvering her lips with corners of mouth retracting (flexing/relaxing).  She notes that she has video on her cell of her pet bird and [name] is noted in the video appearing as though she is lip-smacking and mouth puckering.  She notes that her family has mentioned these abnormalities of late.  She notes that she doesn't even know when she does this, but more recently she has been noting these uncanny orofacial-lingual movements.   Past Medical History  Diagnosis Date  . Allergic rhinitis   . GERD (gastroesophageal reflux disease)   . Chronic sinusitis   . Respiratory failure ([place])   . Cavitary lung disease   . PONV (postoperative nausea and vomiting)   . Endotracheally intubated   . Anxiety   . Anemia   . Pneumonia 01/2013  . Hypogammaglobulinemia, acquired ([place]) 12/30/2009    Qualifier: Diagnosis of  By: Joya Gaskins MD, Burnett Harry   . Non Hodgkin's lymphoma ([place]) 2011  . Myocardial infarction ([place]) 2011  . Asthma   . Chronic kidney disease   . Iron deficiency anemia 01/18/2015  . B12 deficiency 01/18/2015  . Narcolepsy without cataplexy 01/18/2015  . 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     is allergic to meperidine  hcl and montelukast sodium.  Current Outpatient Prescriptions on File Prior to Visit  Medication Sig Dispense Refill  . acetaminophen (TYLENOL) 500 MG tablet Take 1,000 mg by mouth every 6 (six) hours as needed for pain or fever.     [name] Kitchen acyclovir (ZOVIRAX) 400 MG tablet TAKE ONE TABLET BY MOUTH TWICE DAILY. 60 tablet 3  . albuterol (PROVENTIL) (2.5 MG/3ML) 0.083% nebulizer solution Take 2.5 mg by nebulization every 4 (four) hours as needed for wheezing or shortness of breath.    . Armodafinil (NUVIGIL) 150 MG tablet Take 150 mg by mouth daily.    . citalopram (CELEXA) 20 MG tablet Take 20 mg by mouth every morning.      . cyanocobalamin (,VITAMIN B-12,) 1000 MCG/ML injection Inject 1 mL (1,000 mcg total) into the muscle once. 1 mL 0  . fluticasone (FLONASE) 50 MCG/ACT nasal spray Place 2 sprays into the nose 2 (two) times daily. 16 g 6  . furosemide (LASIX) 20 MG tablet Take 20 mg by mouth as needed for fluid.   5  . gabapentin (NEURONTIN) 300 MG capsule TAKE 1 CAPSULE BY MOUTH FOUR TIMES DAILY. 120 capsule 3  . mometasone-formoterol (DULERA) 200-5 MCG/ACT AERO Inhale 2 puffs into the lungs 2 (two) times daily. 3 Inhaler 4  . omeprazole (PRILOSEC) 20 MG capsule TAKE ONE CAPSULE BY MOUTH TWICE DAILY BEFORE A MEAL. 180 capsule 0  . predniSONE (DELTASONE) 10 MG tablet Take 4 for four days 3 for four days 2 for four days then 1 daily and STAY (Patient taking differently: Take 5 mg by mouth daily. ) 60 tablet 11  . simvastatin (ZOCOR) 80 MG tablet Take 80 mg by mouth at bedtime.      [name] Kitchen tiotropium (SPIRIVA) 18 MCG inhalation capsule Place 1 capsule (18 mcg total) into inhaler and inhale daily. 30 capsule 3  . VENTOLIN HFA 108 (90 BASE) MCG/ACT inhaler INHALE 2 PUFFS INTO THE LUNGS EVERY 6 HOURS AS NEEDED. 18 g 3  . warfarin (COUMADIN) 10 MG tablet Take 5 mg by mouth every evening.     . zafirlukast (ACCOLATE) 20 MG tablet TAKE 1 TABLET BY MOUTH TWICE DAILY. 60 tablet 5   Current Facility-Administered  Medications on File Prior to Visit  Medication Dose Route Frequency Provider Last Rate Last Dose  . acetaminophen (TYLENOL) tablet 650 mg  650 mg Oral Q6H PRN Baird Cancer, PA-C      . diphenhydrAMINE (BENADRYL) capsule 25 mg  25 mg Oral Once Baird Cancer, PA-C        Past Surgical History  Procedure Laterality Date  . Vesicovaginal fistula closure w/ tah    . Nasal sinus surgery    . Neck surgery      x 2   . Portacath placement  7/11    Removed 6/12  . Basal cell carcinoma excision  03/2011    scalp  . Port-a-cath removal    . Peripherally inserted central catheter insertion    . Picc removal    . Tracheostomy    . Abdominal hysterectomy    . Breast surgery Right   . Colonoscopy N/A 11/14/2012    Procedure: COLONOSCOPY;  Surgeon: Rogene Houston, MD;  Location: [facility] END[facility] SUITE;  Service: Endoscopy;  Laterality: N/A;  830  .  Cataract extraction w/phaco Left 11/17/2014    Procedure: CATARACT EXTRACTION PHACO AND INTRAOCULAR LENS PLACEMENT LEFT EYE;  Surgeon: Tonny Branch, MD;  Location: [facility] ORS;  Service: Ophthalmology;  Laterality: Left;  CDE:5.60  . Cataract extraction w/phaco Right 12/15/2014    Procedure: CATARACT EXTRACTION PHACO AND INTRAOCULAR LENS PLACEMENT RIGHT EYE CDE=5.16;  Surgeon: Tonny Branch, MD;  Location: [facility] ORS;  Service: Ophthalmology;  Laterality: Right;    Denies any headaches, dizziness, double vision, fevers, chills, night sweats, nausea, vomiting, diarrhea, constipation, chest pain, heart palpitations, shortness of breath, blood in stool, black tarry stool, urinary pain, urinary burning, urinary frequency, hematuria.   PHYSICAL EXAMINATION  ECOG PERFORMANCE STATUS: 1 - Symptomatic but completely ambulatory  Filed Vitals:   04/22/15 0848  BP: 133/66  Pulse: 71  Temp: 97.9 F (36.6 C)  Resp: 18    GENERAL:alert, no distress, well nourished, well developed, comfortable, cooperative, smiling and unaccompanied, in chemo-recliner ready for IVIG  infusion. SKIN: skin color, texture, turgor are normal, no rashes or significant lesions HEAD: Normocephalic, No masses, lesions, tenderness or abnormalities EYES: normal, EOMI, Conjunctiva are pink and non-injected EARS: External ears normal OROPHARYNX:lips, buccal mucosa, and tongue normal and mucous membranes are moist  NECK: supple, thyroid normal size, non-tender, without nodularity, trachea midline LYMPH:  no palpable lymphadenopathy BREAST:not examined LUNGS: clear to auscultation and percussion, decreased breath sounds bilaterally. HEART: regular rate & rhythm, no murmurs, no gallops, S1 normal and S2 normal ABDOMEN:abdomen soft, non-tender and normal bowel sounds BACK: Back symmetric, no curvature., No CVA tenderness EXTREMITIES:less then 2 second capillary refill, no joint deformities, effusion, or inflammation, no skin discoloration, no clubbing, no cyanosis  NEURO: alert & oriented x 3 with fluent speech, no focal motor/sensory deficits, gait normal   LABORATORY DATA: CBC    Component Value Date/Time   WBC 9.7 04/22/2015 0819   RBC 4.07 04/22/2015 0819   RBC 3.97 11/14/2014 1320   HGB 12.5 04/22/2015 0819   HCT 38.6 04/22/2015 0819   PLT 148* 04/22/2015 0819   MCV 94.8 04/22/2015 0819   MCH 30.7 04/22/2015 0819   MCHC 32.4 04/22/2015 0819   RDW 13.8 04/22/2015 0819   LYMPHSABS 0.9 04/22/2015 0819   MONOABS 0.4 04/22/2015 0819   EOSABS 0.1 04/22/2015 0819   BASOSABS 0.0 04/22/2015 0819      Chemistry      Component Value Date/Time   NA 137 04/22/2015 0819   K 3.8 04/22/2015 0819   CL 102 04/22/2015 0819   CO2 27 04/22/2015 0819   BUN 12 04/22/2015 0819   CREATININE 1.06* 04/22/2015 0819      Component Value Date/Time   CALCIUM 8.7* 04/22/2015 0819   ALKPHOS 87 04/22/2015 0819   AST 20 04/22/2015 0819   ALT 16 04/22/2015 0819   BILITOT 0.5 04/22/2015 0819        PENDING LABS:   RADIOGRAPHIC STUDIES:  No results found.   PATHOLOGY:      ASSESSMENT AND PLAN:  Hypogammaglobulinemia, acquired ([place]) Hypogammaglobulinemia/IgG deficiency with a significant history of recurrent URI/LRI followed by pulmonology.  Receiving IVIG monthly with significant decrease in rate of infections.  Monthly labs: CBC diff, CMET, IgG, IgA, and IgM.  Return in 3 months for follow-up with continued IVIG infusions monthly.  Non Hodgkin's lymphoma ([place]) History of DLBCL, S/P R-CHOP x 4 cycles with IT MTX during cycles 3 and 4.  Treatment was discontinued early due to Pseudomonas sepsis after cycle 2 and 4 (life-threatening).  She finished  maintenance Rituxan on 09/30/2011.  NED  Iron deficiency anemia Iron deficiency anemia, newly discovered on 11/14/2014, S/P IV Ferhame 510 mg on 11/20/2014.  Her last colonoscopy was by Dr. Laural Golden in November 2014.  Depending on future labs and course, she may need to be referred back to Dr. Laural Golden for GI work-up.  Oncology Flowsheet 01/28/2015 02/05/2015  ferric gluconate (NULECIT) IV 125 mg 125 mg   Labs today and in 3 months: CBC diff, iron/TIBC, ferritin.  If need for future IV iron, will refer the patient to Dr. Laural Golden for GI work-up.  Last colonoscopy was in 2014.  B12 deficiency Newly discovered with a low-normal value on 11/14/2014 with negative anti-parietal cell and intrinsic factor antibody testing on 11/20/2014.  She is S/P 3 weekly doses of B12 injections from 11/20/2014- 12/11/2014.  She is encouraged to take PO B12 500- 1000 mcg daily.  Labs today and in 3 months: B12  Narcolepsy without cataplexy Diagnosed in October 2016.  Managed by Dr. Merlene Laughter.  Orofacial dyskinesia Patient reports a 2-3 month history of paraesthesias of tongue, mouth, periorbital area.  She notes that mouth puckering with lip smacking that was subconscious.  She started using Biotene for dry mouth without improvement.  She describes it as a "tic."  She took a video of her bird and in the video (which we reviewed today) she  is noted to retract the corner of her mouth repeatedly.  This is discussed with Dr. Merlene Laughter and he has made the following recommendation:  Will decrease Celexa to 20 mg QOD x 2 weeks, then stop.  He plans on seeing the patient back in his office in about 4 weeks.    Risk of Celexa causing tardive dyskinesia is low, but has been reported.  She is on other medications that have a much less likelihood of causing dyskinesia, but she is on medications that are known to cause xerostomia.    THERAPY PLAN:  Continue with monthly IVIG.  Continue follow-up with pulmonology as directed.  She completed therapy for DLBCL with curative intent in 2011 and therefore her risk of recurrence at this time is low.  NCCN guidelines recommends the follow surveillance for Stage III-IV NHL for those who attain a complete response to therapy (2.2017):  A. H&P every 3-6 months for 5 years, then yearly or as clinically indicated.  B. Labs every 3-6 months for 5 years and then annually or as clinically indicated.  C. CT imaging no more than every 6 months for the first two years after completion of therapy and then only as clinically indicated.    All questions were answered. The patient knows to call the clinic with any problems, questions or concerns. We can certainly see the patient much sooner if necessary.  Patient and plan discussed with Dr. Ancil Linsey and she is in agreement with the aforementioned.   This note is electronically signed by: Doy Mince 04/22/2015 2:29 PM

## 2015-04-21 NOTE — Assessment & Plan Note (Signed)
History of DLBCL, S/P R-CHOP x 4 cycles with IT MTX during cycles 3 and 4.  Treatment was discontinued early due to Pseudomonas sepsis after cycle 2 and 4 (life-threatening).  She finished maintenance Rituxan on 09/30/2011.  NED

## 2015-04-21 NOTE — Assessment & Plan Note (Signed)
Hypogammaglobulinemia/IgG deficiency with a significant history of recurrent URI/LRI followed by pulmonology.  Receiving IVIG monthly with significant decrease in rate of infections.  Monthly labs: CBC diff, CMET, IgG, IgA, and IgM.  Return in 3 months for follow-up with continued IVIG infusions monthly.

## 2015-04-21 NOTE — Assessment & Plan Note (Signed)
Newly discovered with a low-normal value on 11/14/2014 with negative anti-parietal cell and intrinsic factor antibody testing on 11/20/2014.  She is S/P 3 weekly doses of B12 injections from 11/20/2014- 12/11/2014.  She is encouraged to take PO B12 500- 1000 mcg daily.  Labs today and in 3 months: B12

## 2015-04-21 NOTE — Assessment & Plan Note (Signed)
Iron deficiency anemia, newly discovered on 11/14/2014, S/P IV Ferhame 510 mg on 11/20/2014.  Her last colonoscopy was by Dr. Laural Golden in November 2014.  Depending on future labs and course, she may need to be referred back to Dr. Laural Golden for GI work-up.  Oncology Flowsheet 01/28/2015 02/05/2015  ferric gluconate (NULECIT) IV 125 mg 125 mg   Labs today and in 3 months: CBC diff, iron/TIBC, ferritin.  If need for future IV iron, will refer the patient to Dr. Laural Golden for GI work-up.  Last colonoscopy was in 2014.

## 2015-04-21 NOTE — Assessment & Plan Note (Signed)
Diagnosed in October 2016.  Managed by Dr. Merlene Laughter.

## 2015-04-22 ENCOUNTER — Encounter (HOSPITAL_COMMUNITY): Payer: Self-pay | Admitting: Oncology

## 2015-04-22 ENCOUNTER — Encounter (HOSPITAL_COMMUNITY): Payer: Medicare Other

## 2015-04-22 ENCOUNTER — Other Ambulatory Visit (HOSPITAL_COMMUNITY): Payer: Self-pay | Admitting: Oncology

## 2015-04-22 ENCOUNTER — Encounter (HOSPITAL_COMMUNITY): Payer: Medicare Other | Attending: Hematology and Oncology

## 2015-04-22 ENCOUNTER — Encounter (HOSPITAL_BASED_OUTPATIENT_CLINIC_OR_DEPARTMENT_OTHER): Payer: Medicare Other | Admitting: Oncology

## 2015-04-22 VITALS — BP 149/72 | HR 48 | Temp 98.0°F | Resp 18

## 2015-04-22 VITALS — BP 133/66 | HR 71 | Temp 97.9°F | Resp 18 | Wt 174.0 lb

## 2015-04-22 DIAGNOSIS — D800 Hereditary hypogammaglobulinemia: Secondary | ICD-10-CM | POA: Insufficient documentation

## 2015-04-22 DIAGNOSIS — D801 Nonfamilial hypogammaglobulinemia: Secondary | ICD-10-CM

## 2015-04-22 DIAGNOSIS — C8590 Non-Hodgkin lymphoma, unspecified, unspecified site: Secondary | ICD-10-CM | POA: Insufficient documentation

## 2015-04-22 DIAGNOSIS — D509 Iron deficiency anemia, unspecified: Secondary | ICD-10-CM

## 2015-04-22 DIAGNOSIS — D869 Sarcoidosis, unspecified: Secondary | ICD-10-CM | POA: Insufficient documentation

## 2015-04-22 DIAGNOSIS — Z8572 Personal history of non-Hodgkin lymphomas: Secondary | ICD-10-CM

## 2015-04-22 DIAGNOSIS — E538 Deficiency of other specified B group vitamins: Secondary | ICD-10-CM

## 2015-04-22 DIAGNOSIS — G244 Idiopathic orofacial dystonia: Secondary | ICD-10-CM

## 2015-04-22 DIAGNOSIS — C8338 Diffuse large B-cell lymphoma, lymph nodes of multiple sites: Secondary | ICD-10-CM

## 2015-04-22 DIAGNOSIS — G47419 Narcolepsy without cataplexy: Secondary | ICD-10-CM

## 2015-04-22 HISTORY — DX: Idiopathic orofacial dystonia: G24.4

## 2015-04-22 LAB — COMPREHENSIVE METABOLIC PANEL
ALBUMIN: 3.6 g/dL (ref 3.5–5.0)
ALK PHOS: 87 U/L (ref 38–126)
ALT: 16 U/L (ref 14–54)
AST: 20 U/L (ref 15–41)
Anion gap: 8 (ref 5–15)
BILIRUBIN TOTAL: 0.5 mg/dL (ref 0.3–1.2)
BUN: 12 mg/dL (ref 6–20)
CALCIUM: 8.7 mg/dL — AB (ref 8.9–10.3)
CO2: 27 mmol/L (ref 22–32)
CREATININE: 1.06 mg/dL — AB (ref 0.44–1.00)
Chloride: 102 mmol/L (ref 101–111)
GFR calc Af Amer: >60 mL/min (ref >60)
GFR calc non Af Amer: 56 mL/min — ABNORMAL LOW (ref >60)
GLUCOSE: 147 mg/dL — AB (ref 65–99)
Potassium: 3.8 mmol/L (ref 3.5–5.1)
SODIUM: 137 mmol/L (ref 135–145)
TOTAL PROTEIN: 7 g/dL (ref 6.5–8.1)

## 2015-04-22 LAB — CBC WITH DIFFERENTIAL/PLATELET
Basophils Absolute: 0 10*3/uL (ref 0.0–0.1)
Basophils Relative: 0 %
EOS ABS: 0.1 10*3/uL (ref 0.0–0.7)
Eosinophils Relative: 1 %
HEMATOCRIT: 38.6 % (ref 36.0–46.0)
HEMOGLOBIN: 12.5 g/dL (ref 12.0–15.0)
LYMPHS ABS: 0.9 10*3/uL (ref 0.7–4.0)
Lymphocytes Relative: 9 %
MCH: 30.7 pg (ref 26.0–34.0)
MCHC: 32.4 g/dL (ref 30.0–36.0)
MCV: 94.8 fL (ref 78.0–100.0)
MONOS PCT: 4 %
Monocytes Absolute: 0.4 10*3/uL (ref 0.1–1.0)
NEUTROS PCT: 86 %
Neutro Abs: 8.3 10*3/uL — ABNORMAL HIGH (ref 1.7–7.7)
Platelets: 148 10*3/uL — ABNORMAL LOW (ref 150–400)
RBC: 4.07 MIL/uL (ref 3.87–5.11)
RDW: 13.8 % (ref 11.5–15.5)
WBC: 9.7 10*3/uL (ref 4.0–10.5)

## 2015-04-22 LAB — IRON AND TIBC
Iron: 59 ug/dL (ref 28–170)
Saturation Ratios: 25 % (ref 10.4–31.8)
TIBC: 239 ug/dL — ABNORMAL LOW (ref 250–450)
UIBC: 180 ug/dL

## 2015-04-22 LAB — VITAMIN B12: Vitamin B-12: 762 pg/mL (ref 180–914)

## 2015-04-22 LAB — FERRITIN: Ferritin: 66 ng/mL (ref 11–307)

## 2015-04-22 LAB — LACTATE DEHYDROGENASE: LDH: 180 U/L (ref 98–192)

## 2015-04-22 MED ORDER — IMMUNE GLOBULIN (HUMAN) 10 GM/100ML IV SOLN
50.0000 g | Freq: Once | INTRAVENOUS | Status: AC
  Administered 2015-04-22: 50 g via INTRAVENOUS
  Filled 2015-04-22: qty 200

## 2015-04-22 MED ORDER — ACETAMINOPHEN 325 MG PO TABS: 650.0000 mg | ORAL_TABLET | Freq: Four times a day (QID) | ORAL | Status: DC | PRN

## 2015-04-22 MED ORDER — SODIUM CHLORIDE 0.9 % IV SOLN
Freq: Once | INTRAVENOUS | Status: AC
  Administered 2015-04-22: 10:00:00 via INTRAVENOUS

## 2015-04-22 MED ORDER — DIPHENHYDRAMINE HCL 25 MG PO CAPS: 25.0000 mg | ORAL_CAPSULE | Freq: Once | ORAL | Status: DC

## 2015-04-22 NOTE — Assessment & Plan Note (Addendum)
Patient reports a 2-3 month history of paraesthesias of tongue, mouth, periorbital area.  She notes that mouth puckering with lip smacking that was subconscious.  She started using Biotene for dry mouth without improvement.  She describes it as a "tic."  She took a video of her bird and in the video (which we reviewed today) she is noted to retract the corner of her mouth repeatedly.  This is discussed with Dr. Merlene Laughter and he has made the following recommendation:  Will decrease Celexa to 20 mg QOD x 2 weeks, then stop.  He plans on seeing the patient back in his office in about 4 weeks.    Risk of Celexa causing tardive dyskinesia is low, but has been reported.  She is on other medications that have a much less likelihood of causing dyskinesia, but she is on medications that are known to cause xerostomia.

## 2015-04-22 NOTE — Progress Notes (Signed)
Patient tolerated infusion well.  VSS throughout.  Left the clinic ambulatory.

## 2015-04-22 NOTE — Patient Instructions (Signed)
[place] at [facility] Discharge Instructions  RECOMMENDATIONS MADE BY THE CONSULTANT AND ANY TEST RESULTS WILL BE SENT TO YOUR REFERRING PHYSICIAN.  Monthly labs: CBC diff, CMET, IgG, IgA, and IgM.  Return in 3 months for follow-up with continued IVIG infusions monthly.   Thank you for choosing [place] at [facility] to provide your oncology and hematology care.  To afford each patient quality time with our provider, please arrive at least 15 minutes before your scheduled appointment time.   Beginning January 23rd 2017 lab work for the [facility] will be done in the  Main lab at [facility] on 1st floor. If you have a lab appointment with the [facility] please come in thru the  Main Entrance and check in at the main information desk  You need to re-schedule your appointment should you arrive 10 or more minutes late.  We strive to give you quality time with our providers, and arriving late affects you and other patients whose appointments are after yours.  Also, if you no show three or more times for appointments you may be dismissed from the clinic at the providers discretion.     Again, thank you for choosing [facility].  Our hope is that these requests will decrease the amount of time that you wait before being seen by our physicians.       _____________________________________________________________  Should you have questions after your visit to [facility], please contact our office at [phone] between the hours of 8:30 a.m. and 4:30 p.m.  Voicemails left after 4:30 p.m. will not be returned until the following business day.  For prescription refill requests, have your pharmacy contact our office.         Resources For Cancer Patients and their Caregivers ? American Cancer Society: Can assist with transportation, wigs, general needs, runs Look Good Feel Better.         (940)585-8753 ? Cancer Care: Provides financial assistance, online support groups, medication/co-pay assistance.  1-800-813-HOPE 838-699-5327) ? [facility] Assists [place] cancer patients and their families through emotional , educational and financial support.  (380)081-2476 ? [place] DSS Where to apply for food stamps, Medicaid and utility assistance. (437)587-4112 ? [facility]: Transportation to medical appointments. 360-517-8103 ? Social Security Administration: May apply for disability if have a Stage IV cancer. 575-200-5261 (458)324-7522 ? [employer], Disability and Transit Services: Assists with nutrition, care and transit needs. 2528051180

## 2015-04-22 NOTE — Patient Instructions (Signed)
[place] at [facility] Discharge Instructions  RECOMMENDATIONS MADE BY THE CONSULTANT AND ANY TEST RESULTS WILL BE SENT TO YOUR REFERRING PHYSICIAN.  IVIG today.    Thank you for choosing [place] at [facility] to provide your oncology and hematology care.  To afford each patient quality time with our provider, please arrive at least 15 minutes before your scheduled appointment time.   Beginning January 23rd 2017 lab work for the [facility] will be done in the  Main lab at [facility] on 1st floor. If you have a lab appointment with the [facility] please come in thru the  Main Entrance and check in at the main information desk  You need to re-schedule your appointment should you arrive 10 or more minutes late.  We strive to give you quality time with our providers, and arriving late affects you and other patients whose appointments are after yours.  Also, if you no show three or more times for appointments you may be dismissed from the clinic at the providers discretion.     Again, thank you for choosing [facility].  Our hope is that these requests will decrease the amount of time that you wait before being seen by our physicians.       _____________________________________________________________  Should you have questions after your visit to [facility], please contact our office at [phone] between the hours of 8:30 a.m. and 4:30 p.m.  Voicemails left after 4:30 p.m. will not be returned until the following business day.  For prescription refill requests, have your pharmacy contact our office.         Resources For Cancer Patients and their Caregivers ? American Cancer Society: Can assist with transportation, wigs, general needs, runs Look Good Feel Better.        445-829-3105 ? Cancer Care: Provides financial assistance, online support groups, medication/co-pay assistance.  1-800-813-HOPE  (434)815-1546) ? [facility] Assists [place] cancer patients and their families through emotional , educational and financial support.  (747)657-1617 ? [place] DSS Where to apply for food stamps, Medicaid and utility assistance. 808-718-6214 ? RCATS: Transportation to medical appointments. 413-577-3965 ? Social Security Administration: May apply for disability if have a Stage IV cancer. [phone] ? [employer], Disability and Transit Services: Assists with nutrition, care and transit needs. 306-110-5319

## 2015-04-23 ENCOUNTER — Ambulatory Visit (HOSPITAL_COMMUNITY): Payer: Medicare Other

## 2015-04-23 ENCOUNTER — Other Ambulatory Visit (HOSPITAL_COMMUNITY): Payer: Medicare Other

## 2015-04-23 ENCOUNTER — Ambulatory Visit (HOSPITAL_COMMUNITY): Payer: Medicare Other | Admitting: Oncology

## 2015-04-23 LAB — IGG, IGA, IGM
IGA: 32 mg/dL — AB (ref 87–352)
IGM, SERUM: 90 mg/dL (ref 26–217)
IgG (Immunoglobin G), Serum: 888 mg/dL (ref 700–1600)

## 2015-04-30 ENCOUNTER — Ambulatory Visit (INDEPENDENT_AMBULATORY_CARE_PROVIDER_SITE_OTHER): Payer: Medicare Other | Admitting: Pulmonary Disease

## 2015-04-30 ENCOUNTER — Encounter: Payer: Self-pay | Admitting: Pulmonary Disease

## 2015-04-30 VITALS — BP 138/84 | HR 60 | Ht 62.0 in | Wt 171.0 lb

## 2015-04-30 DIAGNOSIS — K219 Gastro-esophageal reflux disease without esophagitis: Secondary | ICD-10-CM

## 2015-04-30 DIAGNOSIS — J4540 Moderate persistent asthma, uncomplicated: Secondary | ICD-10-CM

## 2015-04-30 DIAGNOSIS — M810 Age-related osteoporosis without current pathological fracture: Secondary | ICD-10-CM

## 2015-04-30 DIAGNOSIS — J320 Chronic maxillary sinusitis: Secondary | ICD-10-CM | POA: Diagnosis not present

## 2015-04-30 DIAGNOSIS — Z7952 Long term (current) use of systemic steroids: Secondary | ICD-10-CM

## 2015-04-30 DIAGNOSIS — I82409 Acute embolism and thrombosis of unspecified deep veins of unspecified lower extremity: Secondary | ICD-10-CM

## 2015-04-30 DIAGNOSIS — I871 Compression of vein: Secondary | ICD-10-CM

## 2015-04-30 LAB — PULMONARY FUNCTION TEST
DL/VA % PRED: 114 %
DL/VA: 5.2 ml/min/mmHg/L
DLCO COR: 18.96 ml/min/mmHg
DLCO cor % pred: 87 %
DLCO unc % pred: 90 %
DLCO unc: 19.41 ml/min/mmHg
FEF 25-75 PRE: 0.76 L/s
FEF 25-75 Post: 0.94 L/sec
FEF2575-%CHANGE-POST: 22 %
FEF2575-%PRED-POST: 41 %
FEF2575-%PRED-PRE: 34 %
FEV1-%Change-Post: 9 %
FEV1-%PRED-PRE: 55 %
FEV1-%Pred-Post: 60 %
FEV1-Post: 1.43 L
FEV1-Pre: 1.31 L
FEV1FVC-%CHANGE-POST: 5 %
FEV1FVC-%PRED-PRE: 85 %
FEV6-%CHANGE-POST: 4 %
FEV6-%Pred-Post: 68 %
FEV6-%Pred-Pre: 66 %
FEV6-Post: 2.03 L
FEV6-Pre: 1.95 L
FEV6FVC-%Change-Post: 0 %
FEV6FVC-%Pred-Post: 103 %
FEV6FVC-%Pred-Pre: 102 %
FVC-%Change-Post: 3 %
FVC-%PRED-POST: 67 %
FVC-%Pred-Pre: 64 %
FVC-Post: 2.05 L
FVC-Pre: 1.98 L
POST FEV1/FVC RATIO: 70 %
POST FEV6/FVC RATIO: 99 %
PRE FEV1/FVC RATIO: 66 %
Pre FEV6/FVC Ratio: 99 %

## 2015-04-30 NOTE — Progress Notes (Signed)
Spirometry pre and post and Dlco done today. ?

## 2015-04-30 NOTE — Patient Instructions (Signed)
   Use your Acapella/flutter valve/green pickle by blowing into it 3 times in a row twice daily to help clear out your mucus.  Please call your primary care physician's office to set up an appointment to discuss treatment for osteoporosis.  Continue using your inhalers and medications as prescribed.  Please call my office if you have any new breathing problems or questions before your next appointment in 3 months

## 2015-04-30 NOTE — Progress Notes (Signed)
Subjective:    Patient ID: [name], female    DOB: 02-Mar-1955, 60 y.o.   MRN: [id number]  C.C.:  Follow-up for Asthma, Allergic/Chronic Rhinonsinusitis, OSA, DVT, GERD, & Chronic Prednisone Use.  HPI Moderate, Persistent Asthma:  Prescribed Dulera, Spiriva, and Zafirlukast. She continues to have intermittent coughing and wheezing. Cough produces dark to light yellow mucus throughout the day. She is using her rescue inhaler sporadically. She reports no nocturnal awakenings this week. Remotely she was on Xolair without any significant benefit.  Allergic Rhinitis/Chronic Sinusitis:  Treated with 2 weeks of Augmentin at last appointment for acute on chronic sinusitis confirmed with maxillofacial CT scan. She continues to produce a yellow mucus with sinus discharge. She denies any pressure or pain recently.   OSA: Managed by outside physician. Also diagnosed with narcolepsy and was started on Nuvigil and now also Adderall. She reports she continues to use her CPAP faithfully.     DVT:  Patient had DVT & SVC syndrome from clot around her port-a-cath in 2012. Follows with Hematology on chronic Coumadin.  GERD:  Normal barium swallow in September 2016. Currently on Prilosec twice a day. No reflux or dyspepsia. No morning brash water taste.   Chronic Prednisone Use:  Patient has been on Prednisone for years. Currently taking 5mg  daily. DEXA scan consistent with osteoporosis.  Review of Systems She reports she has had a temperature up to 100.30F twice in the last month. No chills or sweats. No chest pain or pressure.   Allergies  Allergen Reactions  . Meperidine Hcl Anaphylaxis  . Montelukast Sodium Hives and Rash   Current Outpatient Prescriptions on File Prior to Visit  Medication Sig Dispense Refill  . acetaminophen (TYLENOL) 500 MG tablet Take 1,000 mg by mouth every 6 (six) hours as needed for pain or fever.     [name] Kitchen acyclovir (ZOVIRAX) 400 MG tablet TAKE ONE TABLET BY MOUTH TWICE DAILY.  60 tablet 3  . albuterol (PROVENTIL) (2.5 MG/3ML) 0.083% nebulizer solution Take 2.5 mg by nebulization every 4 (four) hours as needed for wheezing or shortness of breath.    . amphetamine-dextroamphetamine (ADDERALL) 10 MG tablet Take 10 mg by mouth daily.  0  . Armodafinil (NUVIGIL) 150 MG tablet Take 150 mg by mouth daily.    . citalopram (CELEXA) 20 MG tablet Take 20 mg by mouth every morning.      . furosemide (LASIX) 20 MG tablet Take 20 mg by mouth as needed for fluid.   5  . gabapentin (NEURONTIN) 300 MG capsule TAKE 1 CAPSULE BY MOUTH FOUR TIMES DAILY. 120 capsule 3  . mometasone-formoterol (DULERA) 200-5 MCG/ACT AERO Inhale 2 puffs into the lungs 2 (two) times daily. 3 Inhaler 4  . omeprazole (PRILOSEC) 20 MG capsule TAKE ONE CAPSULE BY MOUTH TWICE DAILY BEFORE A MEAL. 180 capsule 0  . predniSONE (DELTASONE) 10 MG tablet Take 4 for four days 3 for four days 2 for four days then 1 daily and STAY (Patient taking differently: Take 5 mg by mouth daily. ) 60 tablet 11  . simvastatin (ZOCOR) 80 MG tablet Take 80 mg by mouth at bedtime.      [name] Kitchen tiotropium (SPIRIVA) 18 MCG inhalation capsule Place 1 capsule (18 mcg total) into inhaler and inhale daily. 30 capsule 3  . VENTOLIN HFA 108 (90 BASE) MCG/ACT inhaler INHALE 2 PUFFS INTO THE LUNGS EVERY 6 HOURS AS NEEDED. 18 g 3  . warfarin (COUMADIN) 10 MG tablet Take 5 mg by mouth  every evening.     . zafirlukast (ACCOLATE) 20 MG tablet TAKE 1 TABLET BY MOUTH TWICE DAILY. 60 tablet 5  . fluticasone (FLONASE) 50 MCG/ACT nasal spray Place 2 sprays into the nose 2 (two) times daily. (Patient not taking: Reported on 04/30/2015) 16 g 6   No current facility-administered medications on file prior to visit.   Past Medical History  Diagnosis Date  . Allergic rhinitis   . GERD (gastroesophageal reflux disease)   . Chronic sinusitis   . Respiratory failure ([place])   . Cavitary lung disease   . PONV (postoperative nausea and vomiting)   . Endotracheally  intubated   . Anxiety   . Anemia   . Pneumonia 01/2013  . Hypogammaglobulinemia, acquired ([place]) 12/30/2009    Qualifier: Diagnosis of  By: Joya Gaskins MD, Burnett Harry   . Non Hodgkin's lymphoma ([place]) 2011  . Myocardial infarction ([place]) 2011  . Asthma   . Chronic kidney disease   . Iron deficiency anemia 01/18/2015  . B12 deficiency 01/18/2015  . Narcolepsy without cataplexy 01/18/2015  . Orofacial dyskinesia 04/22/2015   Past Surgical History  Procedure Laterality Date  . Vesicovaginal fistula closure w/ tah    . Nasal sinus surgery    . Neck surgery      x 2   . Portacath placement  7/11    Removed 6/12  . Basal cell carcinoma excision  03/2011    scalp  . Port-a-cath removal    . Peripherally inserted central catheter insertion    . Picc removal    . Tracheostomy    . Abdominal hysterectomy    . Breast surgery Right   . Colonoscopy N/A 11/14/2012    Procedure: COLONOSCOPY;  Surgeon: Rogene Houston, MD;  Location: [facility];  Service: Endoscopy;  Laterality: N/A;  830  . Cataract extraction w/phaco Left 11/17/2014    Procedure: CATARACT EXTRACTION PHACO AND INTRAOCULAR LENS PLACEMENT LEFT EYE;  Surgeon: Tonny Branch, MD;  Location: [facility] ORS;  Service: Ophthalmology;  Laterality: Left;  CDE:5.60  . Cataract extraction w/phaco Right 12/15/2014    Procedure: CATARACT EXTRACTION PHACO AND INTRAOCULAR LENS PLACEMENT RIGHT EYE CDE=5.16;  Surgeon: Tonny Branch, MD;  Location: [facility] ORS;  Service: Ophthalmology;  Laterality: Right;   Family History  Problem Relation Age of Onset  . Emphysema Mother   . Allergies Father   . Asthma Father     as a child  . Leukemia Maternal Grandmother   . Diabetes Brother   . Stroke Mother    Social History   Social History  . Marital Status: Divorced    Spouse Name: N/A  . Number of Children: 3  . Years of Education: N/A   Occupational History  . [place] History Main Topics  . Smoking status: Never Smoker   . Smokeless  tobacco: Never Used  . Alcohol Use: No  . Drug Use: No  . Sexual Activity: No   Other Topics Concern  . None   Social History Narrative   Originally from [place]. Always lived in [place]. Prior travel to [place]. 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 She has 1 dog currently. She has 2 conures (small parrots). No mold exposure in her home. 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 No hot tub exposure. She enjoys sewing & quilting.       Objective:  Physical Exam BP 138/84 mmHg  Pulse 60  Ht 5\' 2"  (1.575 m)  Wt 171 lb (77.565 kg)  BMI 31.27 kg/m2  SpO2 93% General:  Awake. Alert. Mild central obesity. Integument:  Warm & dry. No rash on exposed skin.  HEENT:  Moist mucus membranes. No oral ulcers. Mild bilateral nasal turbinate swelling. Cardiovascular:  Regular rate. No edema. Normal S1 & S2.  Pulmonary:  Clear bilaterally to auscultation. Mild intermittent cough. Normal work of breathing on room air.  Abdomen: Soft. Normal bowel sounds. Nondistended.  Musculoskeletal:  Normal bulk and tone. No joint deformity or effusion appreciated.  PFT 04/30/15: FVC 1.98 L (64%) FEV1 1.31 L (55%) FEV1/FVC 0.66 FEF 25-75 0.76 L (34%) no bronchodilator response                                       DLCO corrected 87% 10/08/14: FVC 1.68 L (54%) FEV1 1.14 L (47%) FEV1/FVC 0.67 FEF 25-75 0.75 L (33%) no bronchodilator response TLC 5.16 L (108%) ERV 11% DLCO uncorrected 69%  PSG Split Night Study (05/25/12): AHI 14.7 events/hour. ResMed Quattro Full Face. 15cm H2O. Baseline Sat 90% & Lowest Sat 75%.  IMAGING MAXILLOFACIAL CT W/O 01/30/15 (per radiologist): Fluid in both maxillary sinuses more so in the right. Layering fluid in the sphenoid sinuses. Bilateral ethmoidectomy. Findings consistent with acute on chronic sinusitis.  DEXA (01/30/15): Right femur neck T score -2.5 consistent with osteoporosis.  BARIUM SWALLOW (10/01/14): Normal  esophageal motility. No mass or stricture. No hiatal hernia or reflux.  VENOUS DUPLEX LLE (06/26/14): No evidence of DVT  CT CHEST W/O 01/16/14 (previously reviewed by me): New right lower lobe reticulonodular infiltrate. Linear densities right upper lobe. Resolution of prior left lower lobe opacity. No pathologic mediastinal adenopathy. No pleural effusion or thickening. No pericardial effusion. Minimal esophageal thickening.   CARDIAC TTE (11/02/09): LV EF 30-35% w/ diffuse hypokinesis. Normal wall thickness. RA normal in size. RV normal in size. Pulmonary artery poorly visualized. RVSP 69mmHg. No AS or AR. No MS w/ moderate MR. Tricuspid valve poorly visualized.  MICROBIOLOGY SPUTUM (04/29/10):  M. catarrahlis  TRACH ASPIRATE (11/23/09):  P. aeruginosa BAL (11/15/09):  Bacteria & AFB negative  LABS 01/21/15 Alpha-1 antitrypsin: MM (138)  09/25/14 IgE: 5 Aspergillus antigen:  0.25  08/12/14 CBC: 6.8/10.3/34.3/184 BMP: 141/3.7/102/29/16/1.15/91/8.7 LFT: 3.5/6.5/0.5/71/17/12  IgG: 858 IgA: 44 IgM: 94  INR: 2.61    Assessment & Plan:  60 year old Caucasian female with Moderate, persistent asthma. Patient continues to have moderate-severe fixed airway obstruction on spirometry today but no significant bronchodilator response. I do believe her intermittent coughing with improve with further airway clearance. She seems to have recovered from her acute on chronic pansinusitis. In the absence of infectious symptoms I do not feel that antibiotic therapy is necessary at this time. I reviewed the results of her maxillofacial CT scan with her today. With regards to her underlying asthma without significant symptomatic improvement on previous Xolair and normal IgE on prednisone I do not feel additional treatment will be of great benefit. Her reflux seems to be well-controlled at this time and I do not feel is contributing significantly to her ongoing cough and symptoms. We discussed the  results of her DEXA scan which did show osteoporosis and given her need for continued prednisone treatment would be recommended. I instructed the patient contact my office if she had any new breathing problems before her  next appointment as I would be happy to see her sooner.  1. Moderate, persistent asthma with severe fixed airway obstruction: Continuing Dulera, Spiriva, & Zafirlukast. No benefit from previous Xolair treatment therefore not considering repeat testing. Patient recommended to use flutter valve for airway clearance twice daily. 2. Chronic sinusitis: Continuing prednisone, Zestril, & Flonase. No changes. 3. GERD: Controlled with Prilosec. No changes. 4. Chronic oral steroid use: Continuing prednisone 10 mg by mouth daily. No changes. 5. Osteoporosis: Recommended patient address treatment with her primary care physician. 6. OSA: Managed by outside physician. Recommend continuing treatment indefinitely. 7. DVT & SVC syndrome: Followed by hematology. Continuing on Coumadin chronically. 8. Health maintenance: Influenza vaccine October 2016, Pneumovax October 2013, & Tdap April 2015. Consider Prevnar vaccine at next appointment. 9. Follow-up: Patient to return to clinic in 3 months or sooner if needed.  Sonia Baller Ashok Cordia, M.D. [facility] Pager:  [phone] After 3pm or if no response, call [phone] 2:04 PM 04/30/2015

## 2015-05-19 DIAGNOSIS — Z6829 Body mass index (BMI) 29.0-29.9, adult: Secondary | ICD-10-CM | POA: Diagnosis not present

## 2015-05-19 DIAGNOSIS — Z1389 Encounter for screening for other disorder: Secondary | ICD-10-CM | POA: Diagnosis not present

## 2015-05-19 DIAGNOSIS — E782 Mixed hyperlipidemia: Secondary | ICD-10-CM | POA: Diagnosis not present

## 2015-05-19 DIAGNOSIS — J449 Chronic obstructive pulmonary disease, unspecified: Secondary | ICD-10-CM | POA: Diagnosis not present

## 2015-05-19 DIAGNOSIS — K219 Gastro-esophageal reflux disease without esophagitis: Secondary | ICD-10-CM | POA: Diagnosis not present

## 2015-05-19 DIAGNOSIS — R109 Unspecified abdominal pain: Secondary | ICD-10-CM | POA: Diagnosis not present

## 2015-05-19 DIAGNOSIS — E119 Type 2 diabetes mellitus without complications: Secondary | ICD-10-CM | POA: Diagnosis not present

## 2015-05-19 DIAGNOSIS — G629 Polyneuropathy, unspecified: Secondary | ICD-10-CM | POA: Diagnosis not present

## 2015-05-21 ENCOUNTER — Encounter (HOSPITAL_COMMUNITY): Payer: Medicare Other | Attending: Hematology and Oncology

## 2015-05-21 ENCOUNTER — Encounter (HOSPITAL_COMMUNITY): Payer: Medicare Other

## 2015-05-21 VITALS — BP 140/72 | HR 58 | Temp 98.0°F | Resp 20 | Wt 166.7 lb

## 2015-05-21 DIAGNOSIS — D801 Nonfamilial hypogammaglobulinemia: Secondary | ICD-10-CM | POA: Diagnosis not present

## 2015-05-21 DIAGNOSIS — C8590 Non-Hodgkin lymphoma, unspecified, unspecified site: Secondary | ICD-10-CM | POA: Diagnosis not present

## 2015-05-21 DIAGNOSIS — C8338 Diffuse large B-cell lymphoma, lymph nodes of multiple sites: Secondary | ICD-10-CM

## 2015-05-21 DIAGNOSIS — D869 Sarcoidosis, unspecified: Secondary | ICD-10-CM | POA: Insufficient documentation

## 2015-05-21 DIAGNOSIS — D800 Hereditary hypogammaglobulinemia: Secondary | ICD-10-CM | POA: Diagnosis not present

## 2015-05-21 LAB — COMPREHENSIVE METABOLIC PANEL
ALK PHOS: 105 U/L (ref 38–126)
ALT: 14 U/L (ref 14–54)
ANION GAP: 11 (ref 5–15)
AST: 19 U/L (ref 15–41)
Albumin: 3.8 g/dL (ref 3.5–5.0)
BUN: 11 mg/dL (ref 6–20)
CALCIUM: 8.7 mg/dL — AB (ref 8.9–10.3)
CO2: 29 mmol/L (ref 22–32)
CREATININE: 1.09 mg/dL — AB (ref 0.44–1.00)
Chloride: 99 mmol/L — ABNORMAL LOW (ref 101–111)
GFR, EST NON AFRICAN AMERICAN: 54 mL/min — AB (ref >60)
Glucose, Bld: 135 mg/dL — ABNORMAL HIGH (ref 65–99)
Potassium: 3.6 mmol/L (ref 3.5–5.1)
Sodium: 139 mmol/L (ref 135–145)
Total Bilirubin: 0.6 mg/dL (ref 0.3–1.2)
Total Protein: 7.2 g/dL (ref 6.5–8.1)

## 2015-05-21 LAB — CBC WITH DIFFERENTIAL/PLATELET
Basophils Absolute: 0 10*3/uL (ref 0.0–0.1)
Basophils Relative: 0 %
Eosinophils Absolute: 0.1 10*3/uL (ref 0.0–0.7)
Eosinophils Relative: 1 %
HCT: 41.4 % (ref 36.0–46.0)
Hemoglobin: 13.5 g/dL (ref 12.0–15.0)
LYMPHS PCT: 7 %
Lymphs Abs: 0.8 10*3/uL (ref 0.7–4.0)
MCH: 30.7 pg (ref 26.0–34.0)
MCHC: 32.6 g/dL (ref 30.0–36.0)
MCV: 94.1 fL (ref 78.0–100.0)
Monocytes Absolute: 0.6 10*3/uL (ref 0.1–1.0)
Monocytes Relative: 5 %
NEUTROS ABS: 10 10*3/uL — AB (ref 1.7–7.7)
NEUTROS PCT: 87 %
PLATELETS: 165 10*3/uL (ref 150–400)
RBC: 4.4 MIL/uL (ref 3.87–5.11)
RDW: 13.7 % (ref 11.5–15.5)
WBC: 11.5 10*3/uL — AB (ref 4.0–10.5)

## 2015-05-21 MED ORDER — DIPHENHYDRAMINE HCL 25 MG PO TABS
25.0000 mg | ORAL_TABLET | Freq: Once | ORAL | Status: DC
  Filled 2015-05-21: qty 1

## 2015-05-21 MED ORDER — ACETAMINOPHEN 325 MG PO TABS: 650.0000 mg | ORAL_TABLET | Freq: Four times a day (QID) | ORAL | Status: DC | PRN

## 2015-05-21 MED ORDER — SODIUM CHLORIDE 0.9 % IV SOLN
Freq: Once | INTRAVENOUS | Status: AC
  Administered 2015-05-21: 12:00:00 via INTRAVENOUS

## 2015-05-21 MED ORDER — IMMUNE GLOBULIN (HUMAN) 10 GM/100ML IV SOLN
50.0000 g | Freq: Once | INTRAVENOUS | Status: AC
  Administered 2015-05-21: 50 g via INTRAVENOUS
  Filled 2015-05-21: qty 400

## 2015-05-21 MED ORDER — SODIUM CHLORIDE 0.9 % IJ SOLN: 10.0000 mL | INTRAMUSCULAR | Status: DC | PRN

## 2015-05-21 NOTE — Patient Instructions (Signed)
[place] at [facility] Discharge Instructions  RECOMMENDATIONS MADE BY THE CONSULTANT AND ANY TEST RESULTS WILL BE SENT TO YOUR REFERRING PHYSICIAN.  IVIG today.    Thank you for choosing [place] at [facility] to provide your oncology and hematology care.  To afford each patient quality time with our provider, please arrive at least 15 minutes before your scheduled appointment time.   Beginning January 23rd 2017 lab work for the [facility] will be done in the  Main lab at [facility] on 1st floor. If you have a lab appointment with the [facility] please come in thru the  Main Entrance and check in at the main information desk  You need to re-schedule your appointment should you arrive 10 or more minutes late.  We strive to give you quality time with our providers, and arriving late affects you and other patients whose appointments are after yours.  Also, if you no show three or more times for appointments you may be dismissed from the clinic at the providers discretion.     Again, thank you for choosing [facility]  Our hope is that these requests will decrease the amount of time that you wait before being seen by our physicians.       _____________________________________________________________  Should you have questions after your visit to [facility], please contact our office at [phone] between the hours of 8:30 a.m. and 4:30 p.m.  Voicemails left after 4:30 p.m. will not be returned until the following business day.  For prescription refill requests, have your pharmacy contact our office.         Resources For Cancer Patients and their Caregivers ? American Cancer Society: Can assist with transportation, wigs, general needs, runs Look Good Feel Better.        9342215953 ? Cancer Care: Provides financial assistance, online support groups, medication/co-pay assistance.  1-800-813-HOPE  4635037114) ? [place] Assists [place] cancer patients and their families through emotional , educational and financial support.  843-567-5637 ? [place] DSS Where to apply for food stamps, Medicaid and utility assistance. (916)096-4982 ? RCATS: Transportation to medical appointments. 949 887 2893 ? Social Security Administration: May apply for disability if have a Stage IV cancer. (440)821-7490 [phone])177-6106 ? [employer], Disability and Transit Services: Assists with nutrition, care and transit needs. [place] Support Programs: @10RELATIVEDAYS @ > Cancer Support Group  2nd Tuesday of the month 1pm-2pm, Journey Room  > Creative Journey  3rd Tuesday of the month 1130am-1pm, Journey Room  > Look Good Feel Better  1st Wednesday of the month 10am-12 noon, Journey Room (Call [place] to register [phone])

## 2015-05-21 NOTE — Progress Notes (Signed)
Patient tolerated infusion well.  VSS.  Patient concerns pertaining to abdominal pain, emesis, and weight loss reported to PA, who stated that he had been informed prior to today and that we would see her in July as scheduled.

## 2015-05-22 ENCOUNTER — Ambulatory Visit (HOSPITAL_COMMUNITY): Payer: Medicare Other

## 2015-05-22 ENCOUNTER — Other Ambulatory Visit (HOSPITAL_COMMUNITY): Payer: Medicare Other

## 2015-05-22 LAB — IGG, IGA, IGM
IGG (IMMUNOGLOBIN G), SERUM: 956 mg/dL (ref 700–1600)
IGM, SERUM: 93 mg/dL (ref 26–217)
IgA: 23 mg/dL — ABNORMAL LOW (ref 87–352)

## 2015-05-25 DIAGNOSIS — N183 Chronic kidney disease, stage 3 (moderate): Secondary | ICD-10-CM | POA: Diagnosis not present

## 2015-05-25 DIAGNOSIS — N179 Acute kidney failure, unspecified: Secondary | ICD-10-CM | POA: Diagnosis not present

## 2015-05-25 DIAGNOSIS — N261 Atrophy of kidney (terminal): Secondary | ICD-10-CM | POA: Diagnosis not present

## 2015-05-25 DIAGNOSIS — D801 Nonfamilial hypogammaglobulinemia: Secondary | ICD-10-CM | POA: Diagnosis not present

## 2015-06-04 ENCOUNTER — Other Ambulatory Visit (HOSPITAL_COMMUNITY): Payer: Self-pay | Admitting: Internal Medicine

## 2015-06-04 DIAGNOSIS — R1012 Left upper quadrant pain: Secondary | ICD-10-CM

## 2015-06-08 DIAGNOSIS — C8590 Non-Hodgkin lymphoma, unspecified, unspecified site: Secondary | ICD-10-CM | POA: Diagnosis not present

## 2015-06-08 DIAGNOSIS — R209 Unspecified disturbances of skin sensation: Secondary | ICD-10-CM | POA: Diagnosis not present

## 2015-06-08 DIAGNOSIS — E785 Hyperlipidemia, unspecified: Secondary | ICD-10-CM | POA: Diagnosis not present

## 2015-06-08 DIAGNOSIS — Z79899 Other long term (current) drug therapy: Secondary | ICD-10-CM | POA: Diagnosis not present

## 2015-06-08 DIAGNOSIS — G4733 Obstructive sleep apnea (adult) (pediatric): Secondary | ICD-10-CM | POA: Diagnosis not present

## 2015-06-08 DIAGNOSIS — G47419 Narcolepsy without cataplexy: Secondary | ICD-10-CM | POA: Diagnosis not present

## 2015-06-08 DIAGNOSIS — E1140 Type 2 diabetes mellitus with diabetic neuropathy, unspecified: Secondary | ICD-10-CM | POA: Diagnosis not present

## 2015-06-08 DIAGNOSIS — K219 Gastro-esophageal reflux disease without esophagitis: Secondary | ICD-10-CM | POA: Diagnosis not present

## 2015-06-08 DIAGNOSIS — J45909 Unspecified asthma, uncomplicated: Secondary | ICD-10-CM | POA: Diagnosis not present

## 2015-06-10 DIAGNOSIS — R1012 Left upper quadrant pain: Secondary | ICD-10-CM | POA: Diagnosis not present

## 2015-06-10 DIAGNOSIS — Z8572 Personal history of non-Hodgkin lymphomas: Secondary | ICD-10-CM | POA: Diagnosis not present

## 2015-06-10 DIAGNOSIS — R194 Change in bowel habit: Secondary | ICD-10-CM | POA: Diagnosis not present

## 2015-06-10 DIAGNOSIS — Z90710 Acquired absence of both cervix and uterus: Secondary | ICD-10-CM | POA: Diagnosis not present

## 2015-06-10 DIAGNOSIS — N2889 Other specified disorders of kidney and ureter: Secondary | ICD-10-CM | POA: Diagnosis not present

## 2015-06-10 DIAGNOSIS — K5900 Constipation, unspecified: Secondary | ICD-10-CM | POA: Diagnosis not present

## 2015-06-10 DIAGNOSIS — K769 Liver disease, unspecified: Secondary | ICD-10-CM | POA: Diagnosis not present

## 2015-06-12 DIAGNOSIS — M79675 Pain in left toe(s): Secondary | ICD-10-CM | POA: Diagnosis not present

## 2015-06-12 DIAGNOSIS — M79674 Pain in right toe(s): Secondary | ICD-10-CM | POA: Diagnosis not present

## 2015-06-12 DIAGNOSIS — L851 Acquired keratosis [keratoderma] palmaris et plantaris: Secondary | ICD-10-CM | POA: Diagnosis not present

## 2015-06-22 ENCOUNTER — Other Ambulatory Visit (HOSPITAL_COMMUNITY): Payer: Medicare Other

## 2015-06-22 ENCOUNTER — Ambulatory Visit (HOSPITAL_COMMUNITY): Payer: Medicare Other

## 2015-06-23 ENCOUNTER — Other Ambulatory Visit (HOSPITAL_COMMUNITY): Payer: Medicare Other

## 2015-06-23 ENCOUNTER — Encounter (HOSPITAL_COMMUNITY): Payer: Medicare Other | Attending: Hematology and Oncology

## 2015-06-23 ENCOUNTER — Ambulatory Visit (HOSPITAL_COMMUNITY): Payer: Medicare Other

## 2015-06-23 ENCOUNTER — Encounter (HOSPITAL_COMMUNITY): Payer: Medicare Other

## 2015-06-23 VITALS — BP 158/71 | HR 63 | Temp 98.0°F | Resp 18 | Wt 168.2 lb

## 2015-06-23 DIAGNOSIS — C8338 Diffuse large B-cell lymphoma, lymph nodes of multiple sites: Secondary | ICD-10-CM

## 2015-06-23 DIAGNOSIS — C8590 Non-Hodgkin lymphoma, unspecified, unspecified site: Secondary | ICD-10-CM | POA: Diagnosis not present

## 2015-06-23 DIAGNOSIS — D801 Nonfamilial hypogammaglobulinemia: Secondary | ICD-10-CM | POA: Insufficient documentation

## 2015-06-23 DIAGNOSIS — D800 Hereditary hypogammaglobulinemia: Secondary | ICD-10-CM | POA: Diagnosis not present

## 2015-06-23 DIAGNOSIS — D869 Sarcoidosis, unspecified: Secondary | ICD-10-CM | POA: Insufficient documentation

## 2015-06-23 LAB — CBC WITH DIFFERENTIAL/PLATELET
BASOS ABS: 0 10*3/uL (ref 0.0–0.1)
Basophils Relative: 0 %
EOS ABS: 0.1 10*3/uL (ref 0.0–0.7)
Eosinophils Relative: 1 %
HCT: 40.4 % (ref 36.0–46.0)
HEMOGLOBIN: 13.1 g/dL (ref 12.0–15.0)
LYMPHS ABS: 0.7 10*3/uL (ref 0.7–4.0)
LYMPHS PCT: 6 %
MCH: 30.8 pg (ref 26.0–34.0)
MCHC: 32.4 g/dL (ref 30.0–36.0)
MCV: 95.1 fL (ref 78.0–100.0)
Monocytes Absolute: 0.4 10*3/uL (ref 0.1–1.0)
Monocytes Relative: 4 %
NEUTROS PCT: 89 %
Neutro Abs: 9.5 10*3/uL — ABNORMAL HIGH (ref 1.7–7.7)
Platelets: 158 10*3/uL (ref 150–400)
RBC: 4.25 MIL/uL (ref 3.87–5.11)
RDW: 13.9 % (ref 11.5–15.5)
WBC: 10.7 10*3/uL — AB (ref 4.0–10.5)

## 2015-06-23 LAB — COMPREHENSIVE METABOLIC PANEL
ALT: 14 U/L (ref 14–54)
AST: 20 U/L (ref 15–41)
Albumin: 3.7 g/dL (ref 3.5–5.0)
Alkaline Phosphatase: 101 U/L (ref 38–126)
Anion gap: 9 (ref 5–15)
BUN: 13 mg/dL (ref 6–20)
CHLORIDE: 103 mmol/L (ref 101–111)
CO2: 28 mmol/L (ref 22–32)
CREATININE: 1.23 mg/dL — AB (ref 0.44–1.00)
Calcium: 8.5 mg/dL — ABNORMAL LOW (ref 8.9–10.3)
GFR, EST AFRICAN AMERICAN: 54 mL/min — AB (ref >60)
GFR, EST NON AFRICAN AMERICAN: 47 mL/min — AB (ref >60)
Glucose, Bld: 120 mg/dL — ABNORMAL HIGH (ref 65–99)
POTASSIUM: 3.9 mmol/L (ref 3.5–5.1)
Sodium: 140 mmol/L (ref 135–145)
TOTAL PROTEIN: 6.7 g/dL (ref 6.5–8.1)
Total Bilirubin: 0.7 mg/dL (ref 0.3–1.2)

## 2015-06-23 MED ORDER — IMMUNE GLOBULIN (HUMAN) 10 GM/100ML IV SOLN
50.0000 g | Freq: Once | INTRAVENOUS | Status: DC
  Filled 2015-06-23: qty 500

## 2015-06-23 MED ORDER — OCTAGAM 10 GM/100ML IV SOLN
50.0000 g | Freq: Once | INTRAVENOUS | Status: AC
  Administered 2015-06-23: 50 g via INTRAVENOUS
  Filled 2015-06-23: qty 500

## 2015-06-23 MED ORDER — SODIUM CHLORIDE 0.9 % IV SOLN
Freq: Once | INTRAVENOUS | Status: AC
  Administered 2015-06-23: 12:00:00 via INTRAVENOUS

## 2015-06-23 MED ORDER — ACETAMINOPHEN 325 MG PO TABS: 650.0000 mg | ORAL_TABLET | Freq: Four times a day (QID) | ORAL | Status: DC | PRN

## 2015-06-23 MED ORDER — DIPHENHYDRAMINE HCL 25 MG PO CAPS: 25.0000 mg | ORAL_CAPSULE | Freq: Once | ORAL | Status: DC

## 2015-06-23 NOTE — Patient Instructions (Signed)
[name] at [facility] Discharge Instructions  RECOMMENDATIONS MADE BY THE CONSULTANT AND ANY TEST RESULTS WILL BE SENT TO YOUR REFERRING PHYSICIAN.  IVIG today.    Thank you for choosing [place] at [facility] to provide your oncology and hematology care.  To afford each patient quality time with our provider, please arrive at least 15 minutes before your scheduled appointment time.   Beginning January 23rd 2017 lab work for the [facility] will be done in the  Main lab at [facility] on 1st floor. If you have a lab appointment with the [facility] please come in thru the  Main Entrance and check in at the main information desk  You need to re-schedule your appointment should you arrive 10 or more minutes late.  We strive to give you quality time with our providers, and arriving late affects you and other patients whose appointments are after yours.  Also, if you no show three or more times for appointments you may be dismissed from the clinic at the providers discretion.     Again, thank you for choosing [facility].  Our hope is that these requests will decrease the amount of time that you wait before being seen by our physicians.       _____________________________________________________________  Should you have questions after your visit to [facility], please contact our office at [phone] between the hours of 8:30 a.m. and 4:30 p.m.  Voicemails left after 4:30 p.m. will not be returned until the following business day.  For prescription refill requests, have your pharmacy contact our office.         Resources For Cancer Patients and their Caregivers ? American Cancer Society: Can assist with transportation, wigs, general needs, runs Look Good Feel Better.        609-748-8732 ? Cancer Care: Provides financial assistance, online support groups, medication/co-pay assistance.  1-800-813-HOPE  431-715-1361) ? [place] Assists [place] cancer patients and their families through emotional , educational and financial support.  (430)328-1387 ? [place] DSS Where to apply for food stamps, Medicaid and utility assistance. 815 164 9309 ? RCATS: Transportation to medical appointments. 220 597 1305 ? Social Security Administration: May apply for disability if have a Stage IV cancer. ([phone] 671-9605 662-508-3246 ? [employer], Disability and Transit Services: Assists with nutrition, care and transit needs. [place] Support Programs: @10RELATIVEDAYS @ > Cancer Support Group  2nd Tuesday of the month 1pm-2pm, Journey Room  > Creative Journey  3rd Tuesday of the month 1130am-1pm, Journey Room  > Look Good Feel Better  1st Wednesday of the month 10am-12 noon, Journey Room (Call [place] to register [phone])

## 2015-06-23 NOTE — Progress Notes (Signed)
Patient tolerated infusion well.  VSS.   

## 2015-06-24 LAB — IGG, IGA, IGM
IgA: 19 mg/dL — ABNORMAL LOW (ref 87–352)
IgG (Immunoglobin G), Serum: 947 mg/dL (ref 700–1600)
IgM, Serum: 115 mg/dL (ref 26–217)

## 2015-06-30 ENCOUNTER — Other Ambulatory Visit: Payer: Self-pay | Admitting: Critical Care Medicine

## 2015-07-22 ENCOUNTER — Other Ambulatory Visit (HOSPITAL_COMMUNITY): Payer: Medicare Other

## 2015-07-22 ENCOUNTER — Ambulatory Visit (HOSPITAL_COMMUNITY): Payer: Medicare Other

## 2015-07-22 ENCOUNTER — Ambulatory Visit (HOSPITAL_COMMUNITY): Payer: Medicare Other | Admitting: Hematology & Oncology

## 2015-07-22 ENCOUNTER — Ambulatory Visit (HOSPITAL_COMMUNITY): Payer: Medicare Other | Admitting: Oncology

## 2015-07-23 DIAGNOSIS — N3000 Acute cystitis without hematuria: Secondary | ICD-10-CM | POA: Diagnosis not present

## 2015-07-23 DIAGNOSIS — Z6830 Body mass index (BMI) 30.0-30.9, adult: Secondary | ICD-10-CM | POA: Diagnosis not present

## 2015-07-23 DIAGNOSIS — K219 Gastro-esophageal reflux disease without esophagitis: Secondary | ICD-10-CM | POA: Diagnosis not present

## 2015-07-23 DIAGNOSIS — J449 Chronic obstructive pulmonary disease, unspecified: Secondary | ICD-10-CM | POA: Diagnosis not present

## 2015-07-28 ENCOUNTER — Encounter (HOSPITAL_COMMUNITY): Payer: Self-pay | Admitting: Oncology

## 2015-07-28 ENCOUNTER — Encounter (HOSPITAL_COMMUNITY): Payer: Medicare Other

## 2015-07-28 ENCOUNTER — Ambulatory Visit (HOSPITAL_COMMUNITY): Payer: Medicare Other

## 2015-07-28 ENCOUNTER — Encounter (HOSPITAL_BASED_OUTPATIENT_CLINIC_OR_DEPARTMENT_OTHER): Payer: Medicare Other

## 2015-07-28 ENCOUNTER — Encounter (HOSPITAL_COMMUNITY): Payer: Medicare Other | Attending: Oncology | Admitting: Oncology

## 2015-07-28 ENCOUNTER — Other Ambulatory Visit (HOSPITAL_COMMUNITY): Payer: Self-pay | Admitting: Oncology

## 2015-07-28 ENCOUNTER — Ambulatory Visit (HOSPITAL_COMMUNITY): Payer: Medicare Other | Admitting: Oncology

## 2015-07-28 VITALS — BP 163/75 | HR 76 | Temp 98.0°F | Resp 18

## 2015-07-28 DIAGNOSIS — G47419 Narcolepsy without cataplexy: Secondary | ICD-10-CM | POA: Diagnosis not present

## 2015-07-28 DIAGNOSIS — N189 Chronic kidney disease, unspecified: Secondary | ICD-10-CM | POA: Insufficient documentation

## 2015-07-28 DIAGNOSIS — I871 Compression of vein: Secondary | ICD-10-CM

## 2015-07-28 DIAGNOSIS — C8330 Diffuse large B-cell lymphoma, unspecified site: Secondary | ICD-10-CM | POA: Diagnosis not present

## 2015-07-28 DIAGNOSIS — D801 Nonfamilial hypogammaglobulinemia: Secondary | ICD-10-CM

## 2015-07-28 DIAGNOSIS — E538 Deficiency of other specified B group vitamins: Secondary | ICD-10-CM | POA: Insufficient documentation

## 2015-07-28 DIAGNOSIS — Z7952 Long term (current) use of systemic steroids: Secondary | ICD-10-CM | POA: Diagnosis not present

## 2015-07-28 DIAGNOSIS — Z9889 Other specified postprocedural states: Secondary | ICD-10-CM | POA: Diagnosis not present

## 2015-07-28 DIAGNOSIS — Z7901 Long term (current) use of anticoagulants: Secondary | ICD-10-CM

## 2015-07-28 DIAGNOSIS — Z90710 Acquired absence of both cervix and uterus: Secondary | ICD-10-CM | POA: Diagnosis not present

## 2015-07-28 DIAGNOSIS — C8338 Diffuse large B-cell lymphoma, lymph nodes of multiple sites: Secondary | ICD-10-CM

## 2015-07-28 DIAGNOSIS — D509 Iron deficiency anemia, unspecified: Secondary | ICD-10-CM | POA: Insufficient documentation

## 2015-07-28 DIAGNOSIS — M810 Age-related osteoporosis without current pathological fracture: Secondary | ICD-10-CM | POA: Diagnosis not present

## 2015-07-28 DIAGNOSIS — K219 Gastro-esophageal reflux disease without esophagitis: Secondary | ICD-10-CM | POA: Insufficient documentation

## 2015-07-28 LAB — CBC WITH DIFFERENTIAL/PLATELET
Basophils Absolute: 0 10*3/uL (ref 0.0–0.1)
Basophils Relative: 0 %
EOS ABS: 0.1 10*3/uL (ref 0.0–0.7)
EOS PCT: 1 %
HCT: 40.7 % (ref 36.0–46.0)
Hemoglobin: 13.4 g/dL (ref 12.0–15.0)
LYMPHS ABS: 0.8 10*3/uL (ref 0.7–4.0)
Lymphocytes Relative: 10 %
MCH: 31.1 pg (ref 26.0–34.0)
MCHC: 32.9 g/dL (ref 30.0–36.0)
MCV: 94.4 fL (ref 78.0–100.0)
Monocytes Absolute: 0.3 10*3/uL (ref 0.1–1.0)
Monocytes Relative: 4 %
Neutro Abs: 6.6 10*3/uL (ref 1.7–7.7)
Neutrophils Relative %: 85 %
PLATELETS: 159 10*3/uL (ref 150–400)
RBC: 4.31 MIL/uL (ref 3.87–5.11)
RDW: 13.6 % (ref 11.5–15.5)
WBC: 7.8 10*3/uL (ref 4.0–10.5)

## 2015-07-28 LAB — COMPREHENSIVE METABOLIC PANEL
ALT: 16 U/L (ref 14–54)
ANION GAP: 6 (ref 5–15)
AST: 24 U/L (ref 15–41)
Albumin: 3.8 g/dL (ref 3.5–5.0)
Alkaline Phosphatase: 91 U/L (ref 38–126)
BUN: 11 mg/dL (ref 6–20)
CHLORIDE: 105 mmol/L (ref 101–111)
CO2: 25 mmol/L (ref 22–32)
CREATININE: 1.01 mg/dL — AB (ref 0.44–1.00)
Calcium: 8.5 mg/dL — ABNORMAL LOW (ref 8.9–10.3)
GFR calc non Af Amer: 59 mL/min — ABNORMAL LOW (ref >60)
Glucose, Bld: 147 mg/dL — ABNORMAL HIGH (ref 65–99)
Potassium: 3.4 mmol/L — ABNORMAL LOW (ref 3.5–5.1)
SODIUM: 136 mmol/L (ref 135–145)
Total Bilirubin: 0.5 mg/dL (ref 0.3–1.2)
Total Protein: 6.8 g/dL (ref 6.5–8.1)

## 2015-07-28 LAB — PROTIME-INR
INR: 2.64 — AB (ref 0.00–1.49)
PROTHROMBIN TIME: 27.8 s — AB (ref 11.6–15.2)

## 2015-07-28 LAB — FERRITIN: Ferritin: 29 ng/mL (ref 11–307)

## 2015-07-28 LAB — IRON AND TIBC
Iron: 52 ug/dL (ref 28–170)
SATURATION RATIOS: 21 % (ref 10.4–31.8)
TIBC: 248 ug/dL — AB (ref 250–450)
UIBC: 196 ug/dL

## 2015-07-28 LAB — VITAMIN B12: VITAMIN B 12: 790 pg/mL (ref 180–914)

## 2015-07-28 MED ORDER — ACETAMINOPHEN 325 MG PO TABS: 650.0000 mg | ORAL_TABLET | Freq: Four times a day (QID) | ORAL | Status: DC | PRN

## 2015-07-28 MED ORDER — IMMUNE GLOBULIN (HUMAN) 20 GM/200ML IV SOLN
50.0000 g | Freq: Once | INTRAVENOUS | Status: AC
  Administered 2015-07-28: 50 g via INTRAVENOUS
  Filled 2015-07-28: qty 100

## 2015-07-28 MED ORDER — IMMUNE GLOBULIN (HUMAN) 10 GM/100ML IV SOLN: 50.0000 g | Freq: Once | INTRAVENOUS | Status: DC

## 2015-07-28 MED ORDER — HEPARIN SOD (PORK) LOCK FLUSH 100 UNIT/ML IV SOLN: 250.0000 [IU] | Freq: Once | INTRAVENOUS | Status: DC | PRN

## 2015-07-28 MED ORDER — ALTEPLASE 2 MG IJ SOLR: 2.0000 mg | Freq: Once | INTRAMUSCULAR | Status: DC | PRN

## 2015-07-28 MED ORDER — SODIUM CHLORIDE 0.9 % IJ SOLN: 3.0000 mL | Freq: Once | INTRAMUSCULAR | Status: DC | PRN

## 2015-07-28 MED ORDER — HEPARIN SOD (PORK) LOCK FLUSH 100 UNIT/ML IV SOLN: 500.0000 [IU] | Freq: Once | INTRAVENOUS | Status: DC | PRN

## 2015-07-28 MED ORDER — DIPHENHYDRAMINE HCL 25 MG PO CAPS: 25.0000 mg | ORAL_CAPSULE | Freq: Once | ORAL | Status: DC

## 2015-07-28 MED ORDER — SODIUM CHLORIDE 0.9 % IV SOLN: Freq: Once | INTRAVENOUS | Status: DC

## 2015-07-28 MED ORDER — SODIUM CHLORIDE 0.9 % IJ SOLN: 10.0000 mL | INTRAMUSCULAR | Status: DC | PRN

## 2015-07-28 NOTE — Patient Instructions (Signed)
[facility] Discharge Instructions for Patients Receiving Chemotherapy   Beginning January 23rd 2017 lab work for the Cancer Center will be done in the  Main lab at [place] on 1st floor. If you have a lab appointment with the Cancer Center please come in thru the  Main Entrance and check in at the main information desk   Today you received the following: IVIG. Follow up as scheduled. Call clinic for any questions or concerns.   To help prevent nausea and vomiting after your treatment, we encourage you to take your nausea medication   If you develop nausea and vomiting, or diarrhea that is not controlled by your medication, call the clinic.  The clinic phone number is [phone]. Office hours are Monday-Friday 8:30am-5:00pm.  BELOW ARE SYMPTOMS THAT SHOULD BE REPORTED IMMEDIATELY:  *FEVER GREATER THAN 101.0 F  *CHILLS WITH OR WITHOUT FEVER  NAUSEA AND VOMITING THAT IS NOT CONTROLLED WITH YOUR NAUSEA MEDICATION  *UNUSUAL SHORTNESS OF BREATH  *UNUSUAL BRUISING OR BLEEDING  TENDERNESS IN MOUTH AND THROAT WITH OR WITHOUT PRESENCE OF ULCERS  *URINARY PROBLEMS  *BOWEL PROBLEMS  UNUSUAL RASH Items with * indicate a potential emergency and should be followed up as soon as possible. If you have an emergency after office hours please contact your primary care physician or go to the nearest emergency department.  Please call the clinic during office hours if you have any questions or concerns.   You may also contact the Patient Navigator at [phone] should you have any questions or need assistance in obtaining follow up care.      Resources For Cancer Patients and their Caregivers ? American Cancer Society: Can assist with transportation, wigs, general needs, runs Look Good Feel Better.        1-888-227-6333 ? Cancer Care: Provides financial assistance, online support groups, medication/co-pay assistance.  1-800-813-HOPE (4673) ? [facility] Assists [place] cancer patients and their families through emotional , educational and financial support.  336-427-4357 ? [place] DSS Where to apply for food stamps, Medicaid and utility assistance. 336-342-1394 ? RCATS: Transportation to medical appointments. 336-347-2287 ? Social Security Administration: May apply for disability if have a Stage IV cancer. 336-342-7796 1-800-772-1213 ? [place] Aging, Disability and Transit Services: Assists with nutrition, care and transit needs. [phone]          

## 2015-07-28 NOTE — Patient Instructions (Signed)
[place] at [facility] Discharge Instructions  RECOMMENDATIONS MADE BY THE CONSULTANT AND ANY TEST RESULTS WILL BE SENT TO YOUR REFERRING PHYSICIAN.  You saw [name], PA-C today. You had labs drawn and IVIG given today. Return for monthly labs. Follow up with Dr. Whitney Muse in 3-4 months. Continue to follow up with PCP and pulmonology as directed.   Thank you for choosing [place] at [facility] to provide your oncology and hematology care.  To afford each patient quality time with our provider, please arrive at least 15 minutes before your scheduled appointment time.   Beginning January 23rd 2017 lab work for the [facility] will be done in the  Main lab at [facility] on 1st floor. If you have a lab appointment with the [facility] please come in thru the  Main Entrance and check in at the main information desk  You need to re-schedule your appointment should you arrive 10 or more minutes late.  We strive to give you quality time with our providers, and arriving late affects you and other patients whose appointments are after yours.  Also, if you no show three or more times for appointments you may be dismissed from the clinic at the providers discretion.     Again, thank you for choosing [facility].  Our hope is that these requests will decrease the amount of time that you wait before being seen by our physicians.       _____________________________________________________________  Should you have questions after your visit to [facility], please contact our office at [phone] between the hours of 8:30 a.m. and 4:30 p.m.  Voicemails left after 4:30 p.m. will not be returned until the following business day.  For prescription refill requests, have your pharmacy contact our office.         Resources For Cancer Patients and their Caregivers ? American Cancer Society: Can assist with transportation,  wigs, general needs, runs Look Good Feel Better.        660-699-8493 ? Cancer Care: Provides financial assistance, online support groups, medication/co-pay assistance.  1-800-813-HOPE 281-382-0475) ? [place] Assists [place] cancer patients and their families through emotional , educational and financial support.  5516625777 ? [place] DSS Where to apply for food stamps, Medicaid and utility assistance. 706-024-0803 ? RCATS: Transportation to medical appointments. 959 203 7283 ? Social Security Administration: May apply for disability if have a Stage IV cancer. 8015158645 [phone] ? [employer], Disability and Transit Services: Assists with nutrition, care and transit needs. [place] Support Programs: @10RELATIVEDAYS @ > Cancer Support Group  2nd Tuesday of the month 1pm-2pm, Journey Room  > Creative Journey  3rd Tuesday of the month 1130am-1pm, Journey Room  > Look Good Feel Better  1st Wednesday of the month 10am-12 noon, Journey Room (Call [place] to register [phone])

## 2015-07-28 NOTE — Progress Notes (Addendum)
[name], MD [place] [id number]  Hypogammaglobulinemia, acquired [facility])  Diffuse large B-cell lymphoma of lymph nodes of multiple regions (HCC)  Iron deficiency anemia - Plan: Ferritin, Iron and TIBC  B12 deficiency - Plan: Vitamin B12  Narcolepsy without cataplexy  SVC syndrome - Plan: Protime-INR  CURRENT THERAPY: 50 g of IVIG monthly for IgG deficiency  INTERVAL HISTORY: [name] 60 y.o. female returns for followup of Hypogammaglobulinemia/IgG deficiency with a significant history of recurrent URI/LRI. AND History of DLBCL, S/P R-CHOP x 4 cycles with IT MTX during cycles 3 and 4.  Treatment was discontinued early due to Pseudomonas sepsis after cycle 2 and 4 (life-threatening).  She finished maintenance Rituxan on 09/30/2011. AND Iron deficiency anemia, newly discovered on 11/14/2014, S/P IV Ferhame 510 mg on 11/20/2014.  Excellent response to IV iron replacement. AND Low-normal B12, with negative anti-parietal cell and intrinsic factor antibodies, started on weekly B12 on 11/20/2014- 12/11/2014 with transition to PO replacement. AND Osteoporosis, managed by primary care provider and on chronic corticosteroid use.   She is doing very well.  She denies any complaints.  She notes intermittent and rare low-grade fever, but denies any B symptoms otherwise.  She denies any enlarged lymph nodes as well.  She asks if she can have her INRs checked here as they have not been checked in some time.  We are drawing labs today for [place], and therefore, I will add an INR.  Review of Systems  Constitutional: Negative for chills, fever and weight loss.  HENT: Positive for congestion (chronic).   Eyes: Negative.   Respiratory: Positive for cough (chronic).   Cardiovascular: Negative.   Gastrointestinal: Negative.   Genitourinary: Negative.   Musculoskeletal: Negative.   Skin: Negative.   Neurological: Negative.  Negative for weakness.    Endo/Heme/Allergies: Negative.   Psychiatric/Behavioral: Negative.     Past Medical History:  Diagnosis Date  . Allergic rhinitis   . Anemia   . Anxiety   . Asthma   . B12 deficiency 01/18/2015  . Cavitary lung disease   . Chronic kidney disease   . Chronic sinusitis   . Endotracheally intubated   . GERD (gastroesophageal reflux disease)   . Hypogammaglobulinemia, acquired ([place]) 12/30/2009   Qualifier: Diagnosis of  By: Joya Gaskins MD, Burnett Harry   . Iron deficiency anemia 01/18/2015  . Myocardial infarction ([place]) 2011  . Narcolepsy without cataplexy 01/18/2015  . Non Hodgkin's lymphoma ([place]) 2011  . Orofacial dyskinesia 04/22/2015  . Pneumonia 01/2013  . PONV (postoperative nausea and vomiting)   . Respiratory failure ([place])   . SVC syndrome 09/25/2014    Past Surgical History:  Procedure Laterality Date  . ABDOMINAL HYSTERECTOMY    . BASAL CELL CARCINOMA EXCISION  03/2011   scalp  . BREAST SURGERY Right   . CATARACT EXTRACTION W/PHACO Left 11/17/2014   Procedure: CATARACT EXTRACTION PHACO AND INTRAOCULAR LENS PLACEMENT LEFT EYE;  Surgeon: Tonny Branch, MD;  Location: [facility] ORS;  Service: Ophthalmology;  Laterality: Left;  CDE:5.60  . CATARACT EXTRACTION W/PHACO Right 12/15/2014   Procedure: CATARACT EXTRACTION PHACO AND INTRAOCULAR LENS PLACEMENT RIGHT EYE CDE=5.16;  Surgeon: Tonny Branch, MD;  Location: [facility] ORS;  Service: Ophthalmology;  Laterality: Right;  . COLONOSCOPY N/A 11/14/2012   Procedure: COLONOSCOPY;  Surgeon: Rogene Houston, MD;  Location: [facility] ENDO SUITE;  Service: Endoscopy;  Laterality: N/A;  830  . NASAL SINUS SURGERY    . NECK SURGERY  x 2   . PERIPHERALLY INSERTED CENTRAL CATHETER INSERTION    . PICC Removal    . PORT-A-CATH REMOVAL    . PORTACATH PLACEMENT  7/11   Removed 6/12  . TRACHEOSTOMY    . VESICOVAGINAL FISTULA CLOSURE W/ TAH      Family History  Problem Relation Age of Onset  . Emphysema Mother   . Allergies Father   . Asthma Father     as a  child  . Leukemia Maternal Grandmother   . Diabetes Brother   . Stroke Mother     Social History   Social History  . Marital status: Divorced    Spouse name: N/A  . Number of children: 3  . Years of education: N/A   Occupational History  . [employer]   Social History Main Topics  . Smoking status: Never Smoker  . Smokeless tobacco: Never Used  . Alcohol use No  . Drug use: No  . Sexual activity: No   Other Topics Concern  . Not on file   Social History Narrative   Originally from [place]. Always lived in [place]. Prior travel to [place]. 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 She has 1 dog currently. She has 2 conures (small parrots). No mold exposure in her home. 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 No hot tub exposure. She enjoys sewing & quilting.      PHYSICAL EXAMINATION  ECOG PERFORMANCE STATUS: 1 - Symptomatic but completely ambulatory  There were no vitals filed for this visit.  BP 137/72 P 69 T 98.5 R 18  GENERAL:alert, no distress, well nourished, well developed, comfortable, cooperative, smiling and in chemo-recliner, unaccompanied SKIN: skin color, texture, turgor are normal, no rashes or significant lesions HEAD: Normocephalic, No masses, lesions, tenderness or abnormalities EYES: normal, EOMI, Conjunctiva are pink and non-injected EARS: External ears normal OROPHARYNX:lips, buccal mucosa, and tongue normal and mucous membranes are moist  NECK: supple, trachea midline LYMPH:  no palpable lymphadenopathy BREAST:not examined LUNGS: clear to auscultation  HEART: regular rate & rhythm ABDOMEN:abdomen soft and normal bowel sounds BACK: Back symmetric, no curvature. EXTREMITIES:less then 2 second capillary refill, no joint deformities, effusion, or inflammation, no skin discoloration, no cyanosis  NEURO: alert & oriented x 3 with fluent speech, no  focal motor/sensory deficits, gait normal   LABORATORY DATA: CBC    Component Value Date/Time   WBC 7.8 07/28/2015 1034   RBC 4.31 07/28/2015 1034   HGB 13.4 07/28/2015 1034   HCT 40.7 07/28/2015 1034   PLT 159 07/28/2015 1034   MCV 94.4 07/28/2015 1034   MCH 31.1 07/28/2015 1034   MCHC 32.9 07/28/2015 1034   RDW 13.6 07/28/2015 1034   LYMPHSABS 0.8 07/28/2015 1034   MONOABS 0.3 07/28/2015 1034   EOSABS 0.1 07/28/2015 1034   BASOSABS 0.0 07/28/2015 1034      Chemistry      Component Value Date/Time   NA 136 07/28/2015 1034   K 3.4 (L) 07/28/2015 1034   CL 105 07/28/2015 1034   CO2 25 07/28/2015 1034   BUN 11 07/28/2015 1034   CREATININE 1.01 (H) 07/28/2015 1034      Component Value Date/Time   CALCIUM 8.5 (L) 07/28/2015 1034   ALKPHOS 91 07/28/2015 1034   AST 24 07/28/2015 1034   ALT 16 07/28/2015 1034   BILITOT 0.5 07/28/2015 1034  PENDING LABS:   RADIOGRAPHIC STUDIES:  No results found.   PATHOLOGY:    ASSESSMENT AND PLAN:  Hypogammaglobulinemia, acquired ([place]) Hypogammaglobulinemia/IgG deficiency with a significant history of recurrent URI/LRI followed by pulmonology.  Receiving IVIG monthly with significant decrease in rate of infections.  Monthly labs: CBC diff, CMET, IgG, IgA, and IgM.  Return in 3 months for follow-up with continued IVIG infusions monthly.  Non Hodgkin's lymphoma ([place]) History of DLBCL, S/P R-CHOP x 4 cycles with IT MTX during cycles 3 and 4.  Treatment was discontinued early due to Pseudomonas sepsis after cycle 2 and 4 (life-threatening).  She finished maintenance Rituxan on 09/30/2011.  NED  Iron deficiency anemia Iron deficiency anemia, newly discovered on 11/14/2014, S/P IV Ferhame 510 mg on 11/20/2014.  Her last colonoscopy was by Dr. Laural Golden in November 2014.  Depending on future labs and course, she may need to be referred back to Dr. Laural Golden for GI work-up.  Oncology Flowsheet 01/28/2015 02/05/2015  ferric  gluconate (NULECIT) IV 125 mg 125 mg   Labs today and in 3 months: CBC diff, iron/TIBC, ferritin.  If need for future IV iron, will refer the patient to Dr. Laural Golden for GI work-up.  Last colonoscopy was in 2014.  Addendum: We will set her up for 1 dose of IV ferric gluconate 125 mg for a calculated iron deficit of ~ 100 mg.   B12 deficiency Newly discovered with a low-normal value on 11/14/2014 with negative anti-parietal cell and intrinsic factor antibody testing on 11/20/2014.  She is S/P 3 weekly doses of B12 injections from 11/20/2014- 12/11/2014.  She is encouraged to take PO B12 500- 1000 mcg daily.  Labs today and in 3 months: B12  Narcolepsy without cataplexy Diagnosed in October 2016.  Managed by Dr. Merlene Laughter.  SVC syndrome History of SVC syndrome, thought to be secondary to port-a-cath.  Port has been removed.  Chart is reviewed and on imaging of CT chest w contrast on 11/09/2012 stable chronic SVC obstruction with chest and abdominal wall collaterals persist.  Additionally, she has a documented thrombosis of lesser saphenous vein at the mid calf level in 2012.  She has been on vitamin K antagonist therapy since.    Her INRs have been followed by primary care provider along with refilling of Coumadin.  She notes that her INR has not been checked in "6 months."  INR will be added today and monthly with IVIG.  I will fax a copy of results to primary care provider, Dr. Gerarda Fraction  I will discuss with Dr. Whitney Muse regarding the recommendation for ongoing anticoagulation.   ORDERS PLACED FOR THIS ENCOUNTER: Orders Placed This Encounter  Procedures  . Ferritin  . Iron and TIBC  . Vitamin B12  . Protime-INR    MEDICATIONS PRESCRIBED THIS ENCOUNTER: Meds ordered this encounter  Medications  . ciprofloxacin (CIPRO) 500 MG tablet    Refill:  0    THERAPY PLAN:  Continue with monthly IVIG.  Continue follow-up with pulmonology as directed.  She completed therapy for DLBCL with  curative intent in 2011 and therefore her risk of recurrence at this time is low.   NCCN guidelines recommends the follow surveillance for Stage I, II for those who asertain a complete response in the first-line treatment setting (3.2017):  A. H&P every 3-6 months for 5 years, then yearly or as clinically indicated.  B. Labs every 3-6 months for 5 years and then annually or as clinically indicated.  C. Imaging only as indicated.  For those ascertaining a complete response in the Stage III, IV setting in first-line treatment setting:  A. H&P every 3-6 months for 5 years, then yearly or as clinically indicated.  B. Labs every 3-6 months for 5 years and then annually or as clinically indicated.  C. CT C/A/P with contrast no more than every 6 months for 2 years after  completion of treatment; then only as indicated.   All questions were answered. The patient knows to call the clinic with any problems, questions or concerns. We can certainly see the patient much sooner if necessary.  Patient and plan discussed with Dr. Ancil Linsey and she is in agreement with the aforementioned.   This note is electronically signed by: Doy Mince 07/28/2015 3:22 PM

## 2015-07-28 NOTE — Assessment & Plan Note (Signed)
Diagnosed in October 2016.  Managed by Dr. Merlene Laughter.

## 2015-07-28 NOTE — Assessment & Plan Note (Signed)
Newly discovered with a low-normal value on 11/14/2014 with negative anti-parietal cell and intrinsic factor antibody testing on 11/20/2014.  She is S/P 3 weekly doses of B12 injections from 11/20/2014- 12/11/2014.  She is encouraged to take PO B12 500- 1000 mcg daily.  Labs today and in 3 months: B12

## 2015-07-28 NOTE — Assessment & Plan Note (Signed)
Hypogammaglobulinemia/IgG deficiency with a significant history of recurrent URI/LRI followed by pulmonology.  Receiving IVIG monthly with significant decrease in rate of infections.  Monthly labs: CBC diff, CMET, IgG, IgA, and IgM.  Return in 3 months for follow-up with continued IVIG infusions monthly.

## 2015-07-28 NOTE — Progress Notes (Signed)
[name] tolerated IVIG well without complaints. Pt discharged self ambulatory in satisfactory

## 2015-07-28 NOTE — Assessment & Plan Note (Addendum)
Iron deficiency anemia, newly discovered on 11/14/2014, S/P IV Ferhame 510 mg on 11/20/2014.  Her last colonoscopy was by Dr. Laural Golden in November 2014.  Depending on future labs and course, she may need to be referred back to Dr. Laural Golden for GI work-up.  Oncology Flowsheet 01/28/2015 02/05/2015  ferric gluconate (NULECIT) IV 125 mg 125 mg   Labs today and in 3 months: CBC diff, iron/TIBC, ferritin.  If need for future IV iron, will refer the patient to Dr. Laural Golden for GI work-up.  Last colonoscopy was in 2014.  Addendum: We will set her up for 1 dose of IV ferric gluconate 125 mg for a calculated iron deficit of ~ 100 mg.

## 2015-07-28 NOTE — Assessment & Plan Note (Signed)
History of SVC syndrome, thought to be secondary to port-a-cath.  Port has been removed.  Chart is reviewed and on imaging of CT chest w contrast on 11/09/2012 stable chronic SVC obstruction with chest and abdominal wall collaterals persist.  Additionally, she has a documented thrombosis of lesser saphenous vein at the mid calf level in 2012.  She has been on vitamin K antagonist therapy since.    Her INRs have been followed by primary care provider along with refilling of Coumadin.  She notes that her INR has not been checked in "6 months."  INR will be added today and monthly with IVIG.  I will fax a copy of results to primary care provider, Dr. Gerarda Fraction  I will discuss with Dr. Whitney Muse regarding the recommendation for ongoing anticoagulation.

## 2015-07-28 NOTE — Assessment & Plan Note (Signed)
History of DLBCL, S/P R-CHOP x 4 cycles with IT MTX during cycles 3 and 4.  Treatment was discontinued early due to Pseudomonas sepsis after cycle 2 and 4 (life-threatening).  She finished maintenance Rituxan on 09/30/2011.  NED

## 2015-07-29 LAB — IGG, IGA, IGM
IgA: 16 mg/dL — ABNORMAL LOW (ref 87–352)
IgG (Immunoglobin G), Serum: 882 mg/dL (ref 700–1600)
IgM, Serum: 66 mg/dL (ref 26–217)

## 2015-07-30 ENCOUNTER — Other Ambulatory Visit (HOSPITAL_COMMUNITY): Payer: Self-pay | Admitting: Hematology & Oncology

## 2015-08-03 ENCOUNTER — Other Ambulatory Visit (HOSPITAL_COMMUNITY): Payer: Self-pay | Admitting: Family Medicine

## 2015-08-03 ENCOUNTER — Ambulatory Visit (HOSPITAL_COMMUNITY)
Admission: RE | Admit: 2015-08-03 | Discharge: 2015-08-03 | Disposition: A | Discharge status: Home / Self Care | Payer: Medicare Other | Source: Ambulatory Visit | Attending: Family Medicine | Admitting: Family Medicine

## 2015-08-03 DIAGNOSIS — Z1389 Encounter for screening for other disorder: Secondary | ICD-10-CM | POA: Diagnosis not present

## 2015-08-03 DIAGNOSIS — M1711 Unilateral primary osteoarthritis, right knee: Secondary | ICD-10-CM | POA: Diagnosis not present

## 2015-08-03 DIAGNOSIS — Z6829 Body mass index (BMI) 29.0-29.9, adult: Secondary | ICD-10-CM | POA: Diagnosis not present

## 2015-08-03 DIAGNOSIS — M25561 Pain in right knee: Secondary | ICD-10-CM

## 2015-08-03 DIAGNOSIS — E663 Overweight: Secondary | ICD-10-CM | POA: Diagnosis not present

## 2015-08-05 ENCOUNTER — Encounter: Payer: Self-pay | Admitting: Pulmonary Disease

## 2015-08-05 ENCOUNTER — Encounter (HOSPITAL_COMMUNITY): Payer: Medicare Other | Attending: Hematology and Oncology

## 2015-08-05 ENCOUNTER — Ambulatory Visit (INDEPENDENT_AMBULATORY_CARE_PROVIDER_SITE_OTHER): Payer: Medicare Other | Admitting: Pulmonary Disease

## 2015-08-05 VITALS — BP 132/74 | HR 55 | Ht 62.0 in | Wt 164.8 lb

## 2015-08-05 VITALS — BP 144/79 | HR 53 | Temp 98.0°F | Resp 18

## 2015-08-05 DIAGNOSIS — D800 Hereditary hypogammaglobulinemia: Secondary | ICD-10-CM | POA: Insufficient documentation

## 2015-08-05 DIAGNOSIS — J329 Chronic sinusitis, unspecified: Secondary | ICD-10-CM | POA: Diagnosis not present

## 2015-08-05 DIAGNOSIS — D869 Sarcoidosis, unspecified: Secondary | ICD-10-CM | POA: Insufficient documentation

## 2015-08-05 DIAGNOSIS — J449 Chronic obstructive pulmonary disease, unspecified: Secondary | ICD-10-CM | POA: Diagnosis not present

## 2015-08-05 DIAGNOSIS — Z23 Encounter for immunization: Secondary | ICD-10-CM | POA: Diagnosis not present

## 2015-08-05 DIAGNOSIS — G4733 Obstructive sleep apnea (adult) (pediatric): Secondary | ICD-10-CM

## 2015-08-05 DIAGNOSIS — C8590 Non-Hodgkin lymphoma, unspecified, unspecified site: Secondary | ICD-10-CM | POA: Insufficient documentation

## 2015-08-05 DIAGNOSIS — Z9989 Dependence on other enabling machines and devices: Secondary | ICD-10-CM

## 2015-08-05 DIAGNOSIS — D509 Iron deficiency anemia, unspecified: Secondary | ICD-10-CM

## 2015-08-05 DIAGNOSIS — D801 Nonfamilial hypogammaglobulinemia: Secondary | ICD-10-CM | POA: Insufficient documentation

## 2015-08-05 DIAGNOSIS — J4540 Moderate persistent asthma, uncomplicated: Secondary | ICD-10-CM

## 2015-08-05 MED ORDER — SODIUM CHLORIDE 0.9 % IV SOLN
125.0000 mg | Freq: Once | INTRAVENOUS | Status: AC
  Administered 2015-08-05: 125 mg via INTRAVENOUS
  Filled 2015-08-05: qty 10

## 2015-08-05 MED ORDER — MOMETASONE FURO-FORMOTEROL FUM 200-5 MCG/ACT IN AERO: 2.0000 | INHALATION_SPRAY | Freq: Two times a day (BID) | RESPIRATORY_TRACT | 4 refills | Status: DC

## 2015-08-05 MED ORDER — OMEPRAZOLE 20 MG PO CPDR: DELAYED_RELEASE_CAPSULE | ORAL | 1 refills | Status: DC

## 2015-08-05 MED ORDER — SODIUM CHLORIDE 0.9 % IV SOLN
INTRAVENOUS | Status: DC
  Administered 2015-08-05: 15:00:00 via INTRAVENOUS

## 2015-08-05 NOTE — Patient Instructions (Signed)
[place] at [facility] Discharge Instructions  RECOMMENDATIONS MADE BY THE CONSULTANT AND ANY TEST RESULTS WILL BE SENT TO YOUR REFERRING PHYSICIAN.  Ferric gluconate 125 mg iron infusion given today as ordered. Return as scheduled.  Thank you for choosing [place] at [facility] to provide your oncology and hematology care.  To afford each patient quality time with our provider, please arrive at least 15 minutes before your scheduled appointment time.   Beginning January 23rd 2017 lab work for the [facility] will be done in the  Main lab at [facility] on 1st floor. If you have a lab appointment with the [facility] please come in thru the  Main Entrance and check in at the main information desk  You need to re-schedule your appointment should you arrive 10 or more minutes late.  We strive to give you quality time with our providers, and arriving late affects you and other patients whose appointments are after yours.  Also, if you no show three or more times for appointments you may be dismissed from the clinic at the providers discretion.     Again, thank you for choosing [facility].  Our hope is that these requests will decrease the amount of time that you wait before being seen by our physicians.       _____________________________________________________________  Should you have questions after your visit to [facility], please contact our office at [phone] between the hours of 8:30 a.m. and 4:30 p.m.  Voicemails left after 4:30 p.m. will not be returned until the following business day.  For prescription refill requests, have your pharmacy contact our office.         Resources For Cancer Patients and their Caregivers ? American Cancer Society: Can assist with transportation, wigs, general needs, runs Look Good Feel Better.        (808)624-4457 ? Cancer Care: Provides financial assistance,  online support groups, medication/co-pay assistance.  1-800-813-HOPE 260-313-4897) ? [place] Assists [place] cancer patients and their families through emotional , educational and financial support.  360-749-0975 ? [place] DSS Where to apply for food stamps, Medicaid and utility assistance. 6171892166 ? [facility]: Transportation to medical appointments. 812-575-9477 ? Social Security Administration: May apply for disability if have a Stage IV cancer. 310-866-0187 (206)548-7715 ? [employer], Disability and Transit Services: Assists with nutrition, care and transit needs. [place] Support Programs: @10RELATIVEDAYS @ > Cancer Support Group  2nd Tuesday of the month 1pm-2pm, Journey Room  > Creative Journey  3rd Tuesday of the month 1130am-1pm, Journey Room  > Look Good Feel Better  1st Wednesday of the month 10am-12 noon, Journey Room (Call [place] to register [phone])

## 2015-08-05 NOTE — Progress Notes (Signed)
Tolerated iron infusion well. Stable and ambulatory on discharge home to self. 

## 2015-08-05 NOTE — Patient Instructions (Addendum)
   Continue taking your medications as prescribed.  Call me if you have any new breathing problems before your next appointment.  Remember to get your Influenza vaccine in September.  I'm faxing a copy of your bone density scan to your PCP. Please call their office to discuss results.  I will see you back in 6 months or sooner if needed.

## 2015-08-05 NOTE — Progress Notes (Signed)
Subjective:    Patient ID: [name], female    DOB: 1955-09-21, 60 y.o.   MRN: [id number]  C.C.:  Follow-up for Moderate, Persistent Asthma w/ Severe Fixed Airway Obstruction, Allergic/Chronic Rhinonsinusitis, OSA, DVT, GERD, & Chronic Prednisone Use.  HPI Moderate, Persistent Asthma w/ Severe Fixed Airway Obstruction:  Prescribed Dulera, Spiriva, and Zafirlukast. No symptomatic benefit from Xolair in the past. She reports no exacerbations since last appointment. She reports rare chest tightness. Intermittent cough that is productive of a yellow mucus and reportedly her baseline. Intermittent wheezing. She reports symptoms mostly with exertion. No nocturnal awakenings with symptoms. Compliant with medications. She is avoiding the excessive heat & humidity. Rarely needing her rescue inhaler.   Allergic Rhinitis/Chronic Sinusitis:  She reports her sinus congestion & pressure is at baseline. She reports her drainage is intermittent. Denies any sinus pain.   OSA: Managed by outside physician. Diagnosed with narcolepsy treated with Nuvigil and Adderall.  Continuing to use CPAP every night unless she falls asleep on her couch.  DVT:  Patient had DVT & SVC syndrome from clot around her port-a-cath in 2012. Follows with Hematology on chronic Coumadin.  GERD:  On Prilosec daily. Denies any morning brash water taste, reflux, or dyspepsia.   Chronic Prednisone Use:  Patient has been on Prednisone for years at 5mg  daily. She cuts her 10mg  pills in half.   Review of Systems No fever, chills, or sweats. No frank chest pain or pressure. No rashes or bruising.   Allergies  Allergen Reactions  . Meperidine Hcl Anaphylaxis  . Montelukast Sodium Hives and Rash   Current Outpatient Prescriptions on File Prior to Visit  Medication Sig Dispense Refill  . acetaminophen (TYLENOL) 500 MG tablet Take 1,000 mg by mouth every 6 (six) hours as needed for pain or fever.     [name] Kitchen acyclovir (ZOVIRAX) 400 MG tablet  TAKE ONE TABLET BY MOUTH TWICE DAILY. 60 tablet 3  . albuterol (PROVENTIL) (2.5 MG/3ML) 0.083% nebulizer solution Take 2.5 mg by nebulization every 4 (four) hours as needed for wheezing or shortness of breath.    . amphetamine-dextroamphetamine (ADDERALL) 10 MG tablet Take 10 mg by mouth daily.  0  . Armodafinil (NUVIGIL) 150 MG tablet Take 150 mg by mouth daily.    . citalopram (CELEXA) 20 MG tablet Take 20 mg by mouth every morning.      . cyanocobalamin 1000 MCG tablet Take 1,000 mcg by mouth daily.    . furosemide (LASIX) 20 MG tablet Take 20 mg by mouth as needed for fluid.   5  . gabapentin (NEURONTIN) 300 MG capsule TAKE 1 CAPSULE BY MOUTH FOUR TIMES DAILY. 120 capsule 0  . mometasone-formoterol (DULERA) 200-5 MCG/ACT AERO Inhale 2 puffs into the lungs 2 (two) times daily. 3 Inhaler 4  . omeprazole (PRILOSEC) 20 MG capsule TAKE ONE CAPSULE BY MOUTH TWICE DAILY BEFORE A MEAL. 180 capsule 0  . predniSONE (DELTASONE) 10 MG tablet TAKE 1 TABLET BY MOUTH ONCE DAILY. (Patient taking differently: TAKE 1/2 TABLET BY MOUTH ONCE DAILY.) 60 tablet 2  . simvastatin (ZOCOR) 80 MG tablet Take 80 mg by mouth at bedtime.      [name] Kitchen tiotropium (SPIRIVA) 18 MCG inhalation capsule Place 1 capsule (18 mcg total) into inhaler and inhale daily. 30 capsule 3  . VENTOLIN HFA 108 (90 BASE) MCG/ACT inhaler INHALE 2 PUFFS INTO THE LUNGS EVERY 6 HOURS AS NEEDED. 18 g 3  . warfarin (COUMADIN) 10 MG tablet Take 5 mg  by mouth every evening.     . zafirlukast (ACCOLATE) 20 MG tablet TAKE 1 TABLET BY MOUTH TWICE DAILY. 60 tablet 5   No current facility-administered medications on file prior to visit.    Past Medical History:  Diagnosis Date  . Allergic rhinitis   . Anemia   . Anxiety   . Asthma   . B12 deficiency 01/18/2015  . Cavitary lung disease   . Chronic kidney disease   . Chronic sinusitis   . Endotracheally intubated   . GERD (gastroesophageal reflux disease)   . Hypogammaglobulinemia, acquired ([place])  12/30/2009   Qualifier: Diagnosis of  By: Joya Gaskins MD, Burnett Harry   . Iron deficiency anemia 01/18/2015  . Myocardial infarction ([place]) 2011  . Narcolepsy without cataplexy 01/18/2015  . Non Hodgkin's lymphoma ([place]) 2011  . Orofacial dyskinesia 04/22/2015  . Pneumonia 01/2013  . PONV (postoperative nausea and vomiting)   . Respiratory failure ([place])   . SVC syndrome 09/25/2014   Past Surgical History:  Procedure Laterality Date  . ABDOMINAL HYSTERECTOMY    . BASAL CELL CARCINOMA EXCISION  03/2011   scalp  . BREAST SURGERY Right   . CATARACT EXTRACTION W/PHACO Left 11/17/2014   Procedure: CATARACT EXTRACTION PHACO AND INTRAOCULAR LENS PLACEMENT LEFT EYE;  Surgeon: Tonny Branch, MD;  Location: [facility];  Service: Ophthalmology;  Laterality: Left;  CDE:5.60  . CATARACT EXTRACTION W/PHACO Right 12/15/2014   Procedure: CATARACT EXTRACTION PHACO AND INTRAOCULAR LENS PLACEMENT RIGHT EYE CDE=5.16;  Surgeon: Tonny Branch, MD;  Location: [facility];  Service: Ophthalmology;  Laterality: Right;  . COLONOSCOPY N/A 11/14/2012   Procedure: COLONOSCOPY;  Surgeon: Rogene Houston, MD;  Location: [facility] SUITE;  Service: Endoscopy;  Laterality: N/A;  830  . NASAL SINUS SURGERY    . NECK SURGERY     x 2   . PERIPHERALLY INSERTED CENTRAL CATHETER INSERTION    . PICC Removal    . PORT-A-CATH REMOVAL    . PORTACATH PLACEMENT  7/11   Removed 6/12  . TRACHEOSTOMY    . VESICOVAGINAL FISTULA CLOSURE W/ TAH     Family History  Problem Relation Age of Onset  . Emphysema Mother   . Allergies Father   . Asthma Father     as a child  . Leukemia Maternal Grandmother   . Diabetes Brother   . Stroke Mother    Social History   Social History  . Marital status: Divorced    Spouse name: N/A  . Number of children: 3  . Years of education: N/A   Occupational History  . [employer]   Social History Main Topics  . Smoking status: Never Smoker  . Smokeless tobacco: Never Used  .  Alcohol use No  . Drug use: No  . Sexual activity: No   Other Topics Concern  . Not on file   Social History Narrative   Originally from [place]. Always lived in [place]. Prior travel to [employer]. 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 She has 1 dog currently. She has 2 conures (small parrots). No mold exposure in her home. 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 No hot tub exposure. She enjoys sewing & quilting.       Objective:   Physical Exam Wt 164 lb 12.8 oz (74.8 kg)   BMI 30.14 kg/m  General:  Awake. Mild central obesity. No  distress. Integument:  Warm & dry. No rash on exposed skin.  HEENT:  Moist mucus membranes. No oral ulcers. Unchanged and mild bilateral nasal turbinate swelling. Cardiovascular:  Regular rate & rhythm. No edema. Normal S1 & S2.  Pulmonary:  Mild squeaks bilaterally. Otherwise good aeration. Normal work of breathing on room air.  Abdomen: Soft. Normal bowel sounds. Nondistended.  Musculoskeletal:  Normal bulk and tone. No joint deformity or effusion appreciated.  PFT 04/30/15: FVC 1.98 L (64%) FEV1 1.31 L (55%) FEV1/FVC 0.66 FEF 25-75 0.76 L (34%) no bronchodilator response                                       DLCO corrected 87% 10/08/14: FVC 1.68 L (54%) FEV1 1.14 L (47%) FEV1/FVC 0.67 FEF 25-75 0.75 L (33%) no bronchodilator response TLC 5.16 L (108%) ERV 11% DLCO uncorrected 69%  PSG Split Night Study (05/25/12): AHI 14.7 events/hour. ResMed Quattro Full Face. 15cm H2O. Baseline Sat 90% & Lowest Sat 75%.  IMAGING MAXILLOFACIAL CT W/O 01/30/15 (per radiologist): Fluid in both maxillary sinuses more so in the right. Layering fluid in the sphenoid sinuses. Bilateral ethmoidectomy. Findings consistent with acute on chronic sinusitis.  DEXA (01/30/15): Right femur neck T score -2.5 consistent with osteoporosis.  BARIUM SWALLOW (10/01/14): Normal esophageal motility. No mass or stricture. No  hiatal hernia or reflux.  VENOUS DUPLEX LLE (06/26/14): No evidence of DVT  CT CHEST W/O 01/16/14 (previously reviewed by me): New right lower lobe reticulonodular infiltrate. Linear densities right upper lobe. Resolution of prior left lower lobe opacity. No pathologic mediastinal adenopathy. No pleural effusion or thickening. No pericardial effusion. Minimal esophageal thickening.   CARDIAC TTE (11/02/09): LV EF 30-35% w/ diffuse hypokinesis. Normal wall thickness. RA normal in size. RV normal in size. Pulmonary artery poorly visualized. RVSP 58mmHg. No AS or AR. No MS w/ moderate MR. Tricuspid valve poorly visualized.  MICROBIOLOGY SPUTUM (04/29/10):  M. catarrahlis  TRACH ASPIRATE (11/23/09):  P. aeruginosa BAL (11/15/09):  Bacteria & AFB negative  LABS 01/21/15 Alpha-1 antitrypsin: MM (138)  09/25/14 IgE: 5 Aspergillus antigen:  0.25  08/12/14 CBC: 6.8/10.3/34.3/184 BMP: 141/3.7/102/29/16/1.15/91/8.7 LFT: 3.5/6.5/0.5/71/17/12  IgG: 858 IgA: 44 IgM: 94  INR: 2.61    Assessment & Plan:  60 year old Caucasian female with moderate, persistent asthma & severe fixed airways obstruction. Symptomatically patient is very well-controlled at this time. She needs minimal use of her rescue inhaler medication. Her chronic sinusitis also seems well controlled at the moment. I did urge the patient to continue with Prilosec and also advised her to be mindful of increased reflux as this can worsen underlying asthma. She is continuing to use her CPAP nightly with good effects regarding quality of sleep which I feel is also contributing to control of her underlying asthma. With her chronic prednisone therapy she has developed osteoporosis by World Health Organization criteria. Given the chronic nature of her prednisone I do not believe that she can safely being weaned off. I instructed the patient to contact my office if she had any new breathing problems or questions before her next  appointment.  1. Moderate, Persistent Asthma w/ Severe Fixed Airway Obstruction: Continuing Dulera, Spiriva, & Zafirlukast without change. Holding on repeat spirometry at this time. 2. Chronic Sinusitis: Continuing Prednisone, Zestril, & Flonase. No changes. 3. GERD: Well-controlled with Prilosec. No changes. 4. Chronic Prednisone Use: Continuing prednisone 5 mg daily. 5. Osteoporosis: Faxing  DEXA scan to primary care physician's office. Urged patient to discuss treatment options by phone with primary care physician. 6. OSA/Narcolepsy: Managed by outside physician. Continuing CPAP therapy indefinitely. Currently prescribed Adderall and Nuvigil. 7. DVT & SVC syndrome: Followed by Hematology. Chronic therapy with Coumadin. 8. Health Maintenance: S/P Pneumovax October 2013 & Tdap April 2015. Prenvar vaccine today.  9. Follow-up: Patient to return to clinic in 6 months or sooner if needed.   Sonia Baller Ashok Cordia, M.D. [facility] Pulmonary & Critical Care Pager:  [phone] After 3pm or if no response, call [phone] 8:57 AM 08/05/15

## 2015-08-06 ENCOUNTER — Ambulatory Visit: Payer: Medicare Other | Admitting: Orthopaedic Surgery

## 2015-08-18 DIAGNOSIS — Z0000 Encounter for general adult medical examination without abnormal findings: Secondary | ICD-10-CM | POA: Diagnosis not present

## 2015-08-18 DIAGNOSIS — Z6828 Body mass index (BMI) 28.0-28.9, adult: Secondary | ICD-10-CM | POA: Diagnosis not present

## 2015-08-18 DIAGNOSIS — K219 Gastro-esophageal reflux disease without esophagitis: Secondary | ICD-10-CM | POA: Diagnosis not present

## 2015-08-18 DIAGNOSIS — J449 Chronic obstructive pulmonary disease, unspecified: Secondary | ICD-10-CM | POA: Diagnosis not present

## 2015-08-25 ENCOUNTER — Ambulatory Visit (HOSPITAL_COMMUNITY)
Admission: RE | Admit: 2015-08-25 | Discharge: 2015-08-25 | Disposition: A | Discharge status: Home / Self Care | Payer: Medicare Other | Source: Ambulatory Visit | Attending: Oncology | Admitting: Oncology

## 2015-08-25 ENCOUNTER — Encounter (HOSPITAL_BASED_OUTPATIENT_CLINIC_OR_DEPARTMENT_OTHER): Payer: Medicare Other

## 2015-08-25 ENCOUNTER — Encounter (HOSPITAL_COMMUNITY): Payer: Self-pay

## 2015-08-25 ENCOUNTER — Encounter (HOSPITAL_COMMUNITY): Payer: Medicare Other

## 2015-08-25 VITALS — BP 149/71 | HR 72 | Temp 98.6°F | Resp 20 | Wt 161.0 lb

## 2015-08-25 DIAGNOSIS — R05 Cough: Secondary | ICD-10-CM | POA: Diagnosis not present

## 2015-08-25 DIAGNOSIS — D800 Hereditary hypogammaglobulinemia: Secondary | ICD-10-CM | POA: Diagnosis not present

## 2015-08-25 DIAGNOSIS — D801 Nonfamilial hypogammaglobulinemia: Secondary | ICD-10-CM

## 2015-08-25 DIAGNOSIS — C8590 Non-Hodgkin lymphoma, unspecified, unspecified site: Secondary | ICD-10-CM | POA: Diagnosis not present

## 2015-08-25 DIAGNOSIS — C8338 Diffuse large B-cell lymphoma, lymph nodes of multiple sites: Secondary | ICD-10-CM

## 2015-08-25 DIAGNOSIS — D869 Sarcoidosis, unspecified: Secondary | ICD-10-CM | POA: Diagnosis not present

## 2015-08-25 DIAGNOSIS — J479 Bronchiectasis, uncomplicated: Secondary | ICD-10-CM | POA: Insufficient documentation

## 2015-08-25 DIAGNOSIS — R0602 Shortness of breath: Secondary | ICD-10-CM | POA: Diagnosis not present

## 2015-08-25 LAB — COMPREHENSIVE METABOLIC PANEL
ALBUMIN: 3.5 g/dL (ref 3.5–5.0)
ALK PHOS: 89 U/L (ref 38–126)
ALT: 16 U/L (ref 14–54)
ANION GAP: 7 (ref 5–15)
AST: 20 U/L (ref 15–41)
BUN: 11 mg/dL (ref 6–20)
CO2: 29 mmol/L (ref 22–32)
Calcium: 8.5 mg/dL — ABNORMAL LOW (ref 8.9–10.3)
Chloride: 99 mmol/L — ABNORMAL LOW (ref 101–111)
Creatinine, Ser: 1.03 mg/dL — ABNORMAL HIGH (ref 0.44–1.00)
GFR calc Af Amer: >60 mL/min (ref >60)
GFR calc non Af Amer: 58 mL/min — ABNORMAL LOW (ref >60)
GLUCOSE: 154 mg/dL — AB (ref 65–99)
POTASSIUM: 3.5 mmol/L (ref 3.5–5.1)
SODIUM: 135 mmol/L (ref 135–145)
Total Bilirubin: 0.5 mg/dL (ref 0.3–1.2)
Total Protein: 6.9 g/dL (ref 6.5–8.1)

## 2015-08-25 LAB — CBC WITH DIFFERENTIAL/PLATELET
BASOS ABS: 0 10*3/uL (ref 0.0–0.1)
BASOS PCT: 0 %
EOS ABS: 0 10*3/uL (ref 0.0–0.7)
Eosinophils Relative: 0 %
HEMATOCRIT: 38.7 % (ref 36.0–46.0)
HEMOGLOBIN: 12.5 g/dL (ref 12.0–15.0)
Lymphocytes Relative: 5 %
Lymphs Abs: 0.7 10*3/uL (ref 0.7–4.0)
MCH: 30.7 pg (ref 26.0–34.0)
MCHC: 32.3 g/dL (ref 30.0–36.0)
MCV: 95.1 fL (ref 78.0–100.0)
MONOS PCT: 5 %
Monocytes Absolute: 0.6 10*3/uL (ref 0.1–1.0)
NEUTROS ABS: 11.4 10*3/uL — AB (ref 1.7–7.7)
NEUTROS PCT: 90 %
Platelets: 144 10*3/uL — ABNORMAL LOW (ref 150–400)
RBC: 4.07 MIL/uL (ref 3.87–5.11)
RDW: 14.1 % (ref 11.5–15.5)
WBC: 12.7 10*3/uL — ABNORMAL HIGH (ref 4.0–10.5)

## 2015-08-25 MED ORDER — ACYCLOVIR 400 MG PO TABS: 400.0000 mg | ORAL_TABLET | Freq: Two times a day (BID) | ORAL | 3 refills | Status: DC

## 2015-08-25 MED ORDER — IMMUNE GLOBULIN (HUMAN) 10 GM/100ML IV SOLN
50.0000 g | Freq: Once | INTRAVENOUS | Status: AC
  Administered 2015-08-25: 50 g via INTRAVENOUS
  Filled 2015-08-25: qty 100

## 2015-08-25 MED ORDER — SODIUM CHLORIDE 0.9 % IV SOLN: Freq: Once | INTRAVENOUS | Status: AC

## 2015-08-25 MED ORDER — DEXTROSE 5 % IV SOLN
INTRAVENOUS | Status: AC
  Administered 2015-08-25: 11:00:00 via INTRAVENOUS

## 2015-08-25 MED ORDER — HEPARIN SOD (PORK) LOCK FLUSH 100 UNIT/ML IV SOLN: 500.0000 [IU] | Freq: Once | INTRAVENOUS | Status: AC | PRN

## 2015-08-25 MED ORDER — SODIUM CHLORIDE 0.9 % IJ SOLN
10.0000 mL | INTRAMUSCULAR | Status: AC | PRN
  Administered 2015-08-25: 10 mL
  Filled 2015-08-25: qty 10

## 2015-08-25 NOTE — Progress Notes (Unsigned)
IVIG infusion given today per orders. Pt tolerated well without incident. Vitals stable and pt discharged home.

## 2015-08-25 NOTE — Patient Instructions (Addendum)
[place] at [facility] Discharge Instructions  RECOMMENDATIONS MADE BY THE CONSULTANT AND ANY TEST RESULTS WILL BE SENT TO YOUR REFERRING PHYSICIAN.  IVIG given today. Follow up as scheduled  CXR done today when you leave  Thank you for choosing [place] at [facility] to provide your oncology and hematology care.  To afford each patient quality time with our provider, please arrive at least 15 minutes before your scheduled appointment time.   Beginning January 23rd 2017 lab work for the [facility] will be done in the  Main lab at [facility] on 1st floor. If you have a lab appointment with the [facility] please come in thru the  Main Entrance and check in at the main information desk  You need to re-schedule your appointment should you arrive 10 or more minutes late.  We strive to give you quality time with our providers, and arriving late affects you and other patients whose appointments are after yours.  Also, if you no show three or more times for appointments you may be dismissed from the clinic at the providers discretion.     Again, thank you for choosing [facility].  Our hope is that these requests will decrease the amount of time that you wait before being seen by our physicians.       _____________________________________________________________  Should you have questions after your visit to [facility], please contact our office at [phone] between the hours of 8:30 a.m. and 4:30 p.m.  Voicemails left after 4:30 p.m. will not be returned until the following business day.  For prescription refill requests, have your pharmacy contact our office.         Resources For Cancer Patients and their Caregivers ? American Cancer Society: Can assist with transportation, wigs, general needs, runs Look Good Feel Better.        ([phone] ? Cancer Care: Provides financial assistance, online  support groups, medication/co-pay assistance.  1-800-813-HOPE (575) 149-1493) ? [place] Assists [place] cancer patients and their families through emotional , educational and financial support.  780-291-5025 ? [place] DSS Where to apply for food stamps, Medicaid and utility assistance. 858-738-8074 ? RCATS: Transportation to medical appointments. ([phone] ? Social Security Administration: May apply for disability if have a Stage IV cancer. [phone] (239)261-6880 ? [employer], Disability and Transit Services: Assists with nutrition, care and transit needs. [place] Support Programs: @10RELATIVEDAYS @ > Cancer Support Group  2nd Tuesday of the month 1pm-2pm, Journey Room  > Creative Journey  3rd Tuesday of the month 1130am-1pm, Journey Room  > Look Good Feel Better  1st Wednesday of the month 10am-12 noon, Journey Room (Call [place] to register [phone])

## 2015-09-08 DIAGNOSIS — Z79899 Other long term (current) drug therapy: Secondary | ICD-10-CM | POA: Diagnosis not present

## 2015-09-08 DIAGNOSIS — K219 Gastro-esophageal reflux disease without esophagitis: Secondary | ICD-10-CM | POA: Diagnosis not present

## 2015-09-08 DIAGNOSIS — J45909 Unspecified asthma, uncomplicated: Secondary | ICD-10-CM | POA: Diagnosis not present

## 2015-09-08 DIAGNOSIS — I80209 Phlebitis and thrombophlebitis of unspecified deep vessels of unspecified lower extremity: Secondary | ICD-10-CM | POA: Diagnosis not present

## 2015-09-08 DIAGNOSIS — G47419 Narcolepsy without cataplexy: Secondary | ICD-10-CM | POA: Diagnosis not present

## 2015-09-08 DIAGNOSIS — E785 Hyperlipidemia, unspecified: Secondary | ICD-10-CM | POA: Diagnosis not present

## 2015-09-08 DIAGNOSIS — M792 Neuralgia and neuritis, unspecified: Secondary | ICD-10-CM | POA: Diagnosis not present

## 2015-09-08 DIAGNOSIS — G4733 Obstructive sleep apnea (adult) (pediatric): Secondary | ICD-10-CM | POA: Diagnosis not present

## 2015-09-08 DIAGNOSIS — E119 Type 2 diabetes mellitus without complications: Secondary | ICD-10-CM | POA: Diagnosis not present

## 2015-09-08 DIAGNOSIS — C8590 Non-Hodgkin lymphoma, unspecified, unspecified site: Secondary | ICD-10-CM | POA: Diagnosis not present

## 2015-09-08 IMAGING — CT CT CHEST W/O CM
2 of 3 series · 15 of 36 positions shown, 18 images · non-contrast
Comparison: 06/24/2013 PET scan

CLINICAL DATA: Subsequent evaluation of left lung nodule lower
lobe, personal history of non-Hodgkin's lymphoma

EXAM:
CT CHEST WITHOUT CONTRAST
TECHNIQUE: Multidetector CT imaging of the chest was performed following the
standard protocol without IV contrast..

[Series 2: chestroutine 5.0 b40f · axial · 0.71mm/px · z∈[-276,-21]mm · 12 of 61 slices shown, 15 images]
[im 5/61  mediastinal]
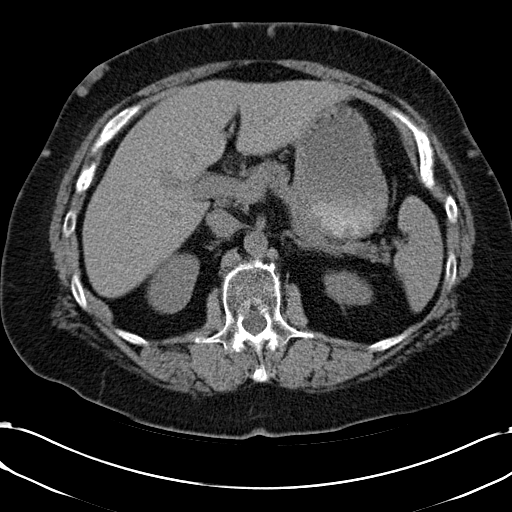
[im 5/61  lung]
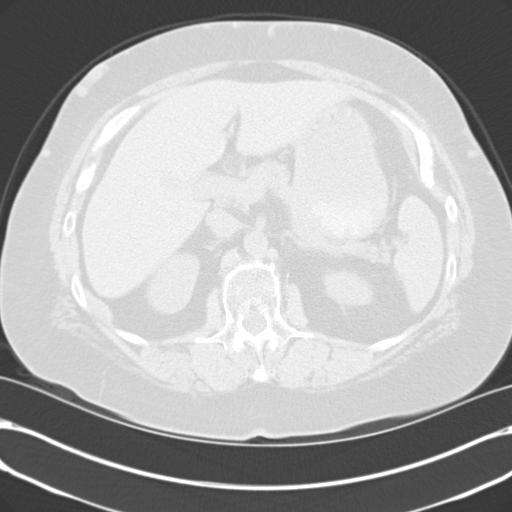
[im 9/61  lung]
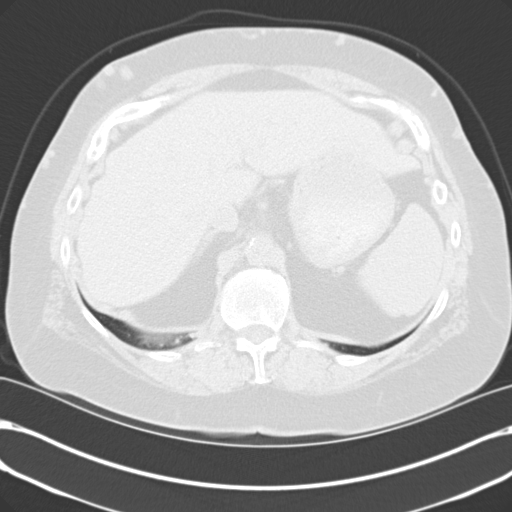
[im 14/61  lung]
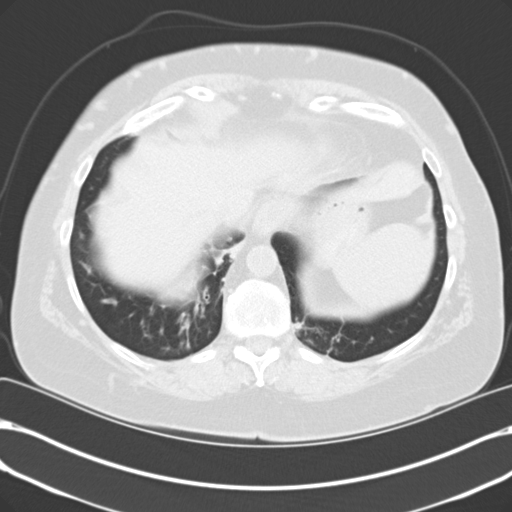
[im 18/61  lung]
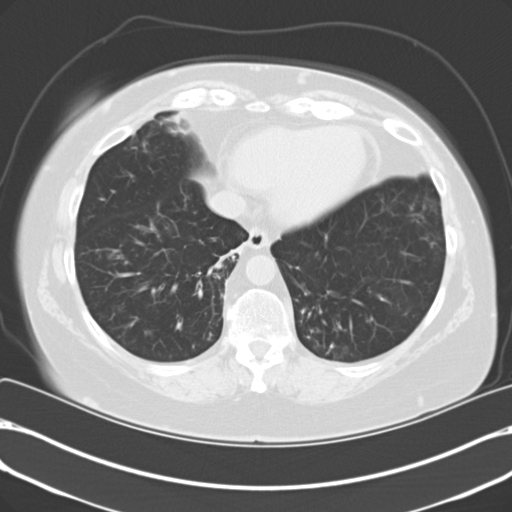
[im 23/61  mediastinal]
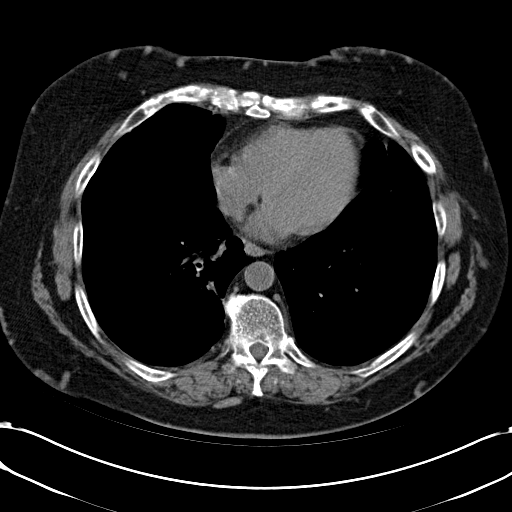
[im 23/61  lung]
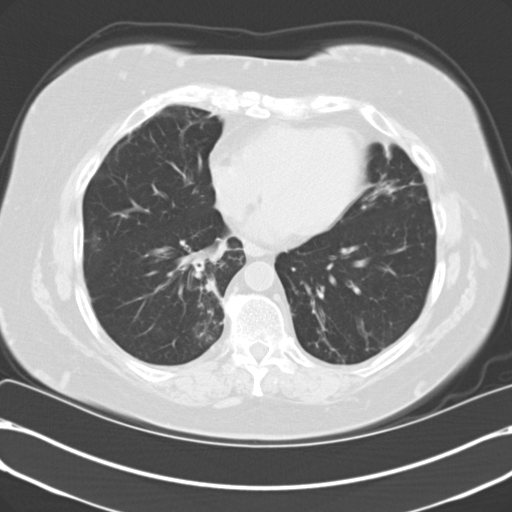
[im 27/61  lung]
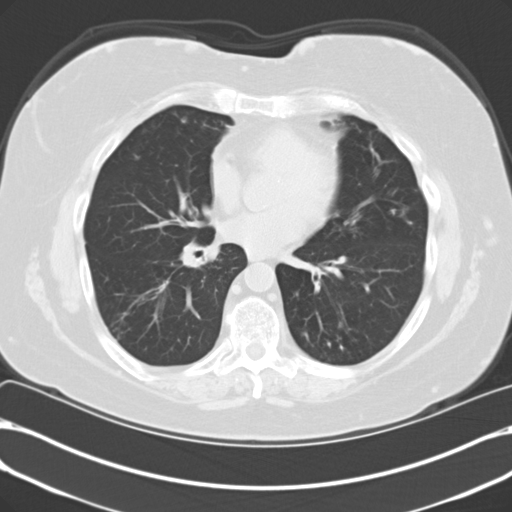
[im 34/61  lung]
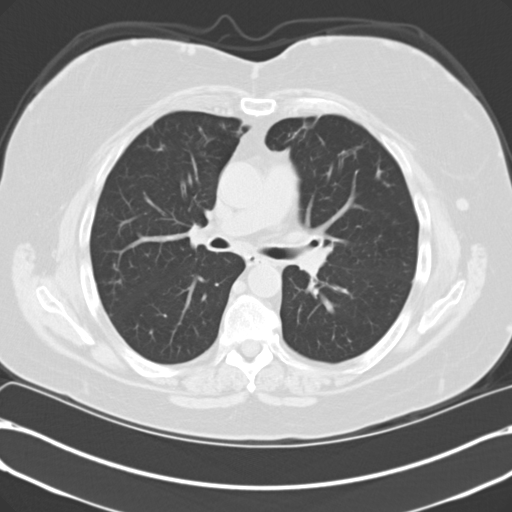
[im 38/61  lung]
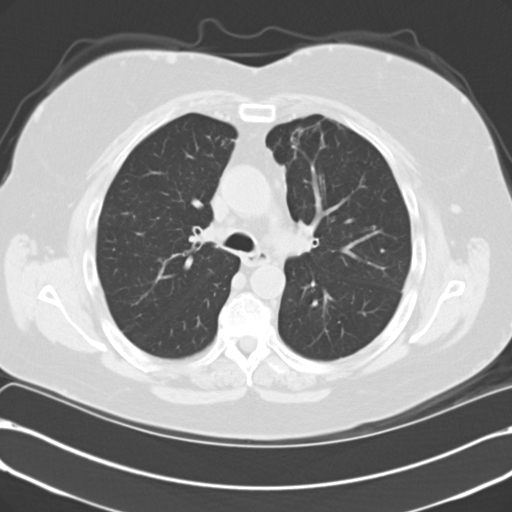
[im 43/61  mediastinal]
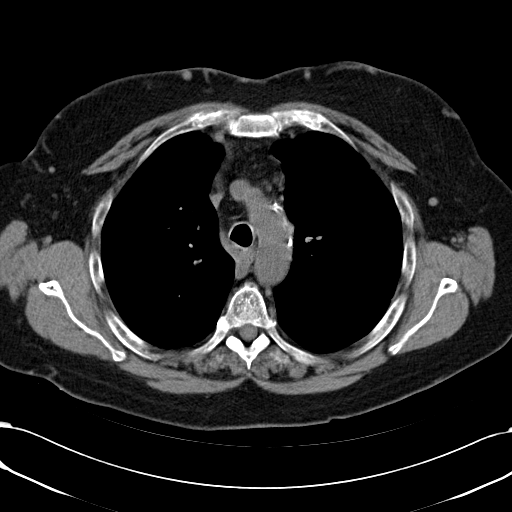
[im 43/61  lung]
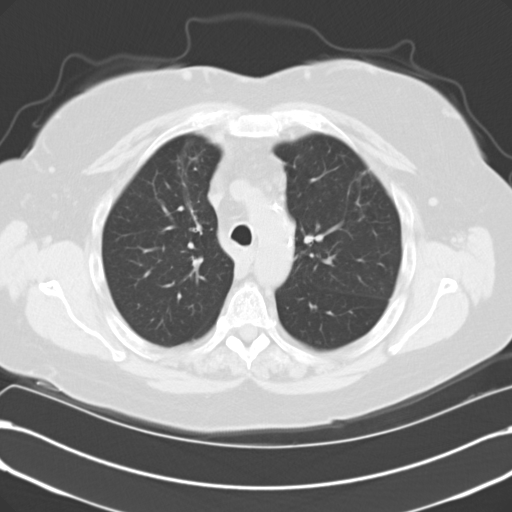
[im 47/61  lung]
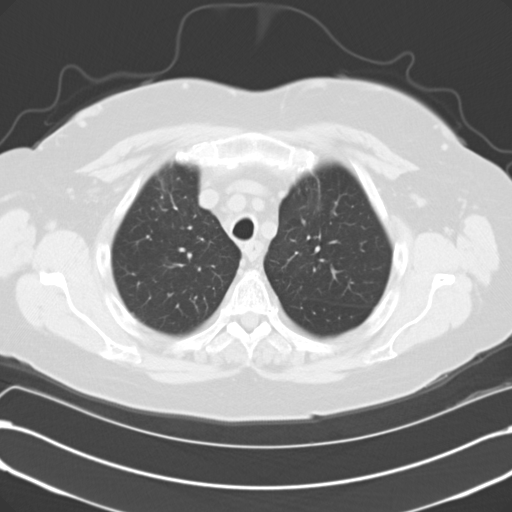
[im 52/61  lung]
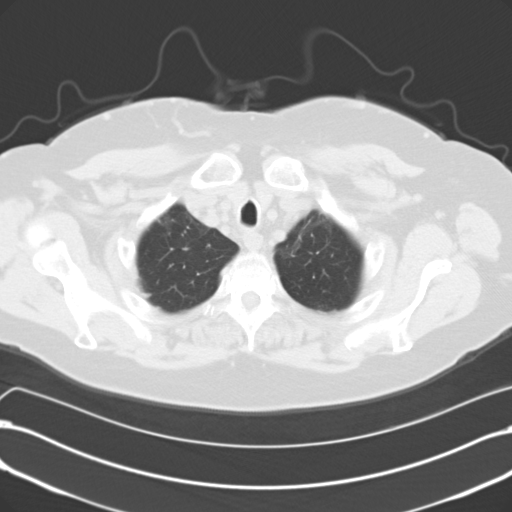
[im 56/61  lung]
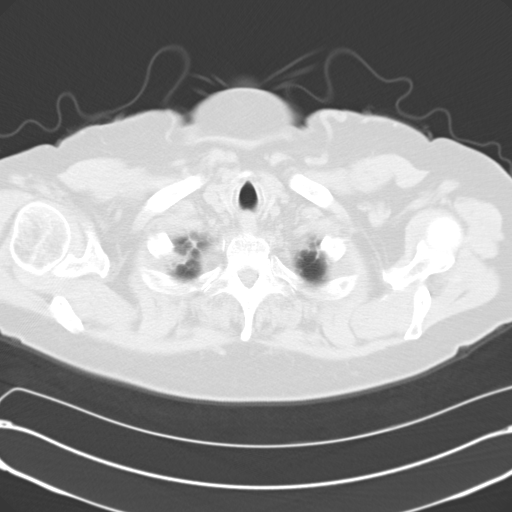

[Series 4: mpr coro 3mm · coronal · 0.61mm/px · 3 of 83 slices shown]
[im 17/83  lung]
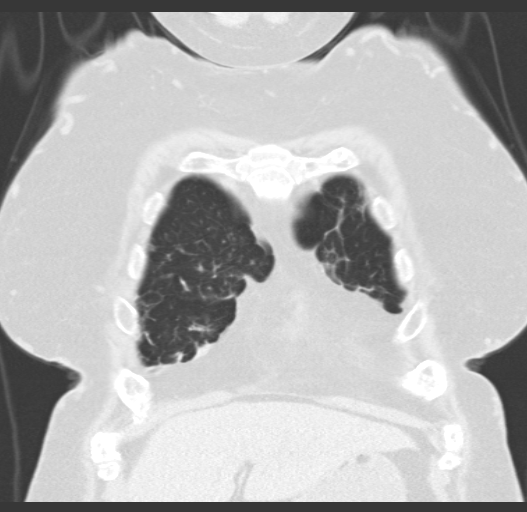
[im 33/83  lung]
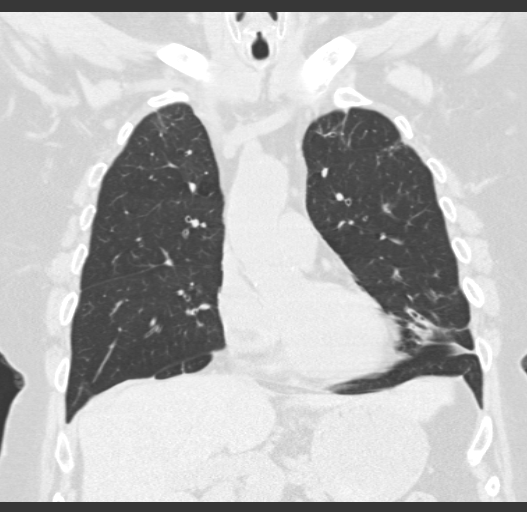
[im 50/83  lung]
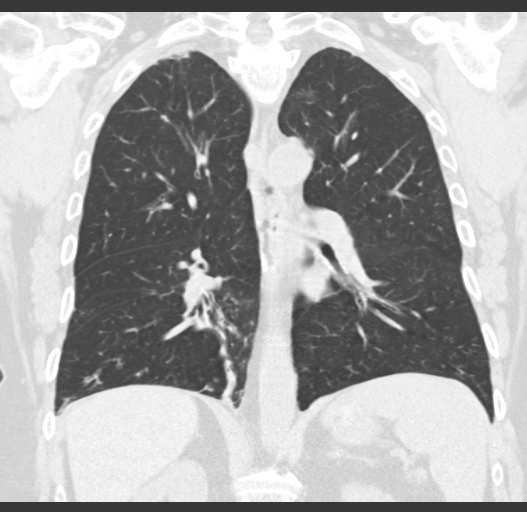

[15 of 36 positions shown; findings below may reference images not displayed]

FINDINGS: Stable mild bilateral lower lobe bronchitic change. In the medial
right lower lobe there is mild reticular nodular infiltrate, new
from prior study. Mild linear densities lateral right upper lobe
image number 26, likely representing scarring as it is not
significantly changed.

Subsegmental atelectasis inferior lateral lingula similar to prior
study. The previously described rounded opacity in the left lower
lobe that measured 8 x 8 mm is not seen current study. Mild right
lung base scarring and atelectasis is very similar to the prior
study.

Thoracic inlet and thyroid are normal. Small mediastinal lymph
nodes, none of which are pathologic by size, not significantly
different from prior study. No pleural or pericardial effusion. No
acute musculoskeletal findings.
IMPRESSION: 1. Interval resolution of 8 mm pulmonary nodule left lower lobe of
2. Interval development of mild reticular nodular infiltrate
medially in the right lower lobe seen on images 37 through 42. This
could represent infectious or inflammatory etiology, although
malignancy including lymphangitic tumor spread is not excluded.

## 2015-09-11 DIAGNOSIS — M79675 Pain in left toe(s): Secondary | ICD-10-CM | POA: Diagnosis not present

## 2015-09-11 DIAGNOSIS — L851 Acquired keratosis [keratoderma] palmaris et plantaris: Secondary | ICD-10-CM | POA: Diagnosis not present

## 2015-09-11 DIAGNOSIS — M205X9 Other deformities of toe(s) (acquired), unspecified foot: Secondary | ICD-10-CM | POA: Diagnosis not present

## 2015-09-11 DIAGNOSIS — L609 Nail disorder, unspecified: Secondary | ICD-10-CM | POA: Diagnosis not present

## 2015-09-22 ENCOUNTER — Encounter (HOSPITAL_COMMUNITY): Payer: Medicare Other

## 2015-09-22 ENCOUNTER — Encounter (HOSPITAL_COMMUNITY): Payer: Medicare Other | Attending: Hematology and Oncology

## 2015-09-22 ENCOUNTER — Encounter (HOSPITAL_COMMUNITY): Payer: Self-pay

## 2015-09-22 VITALS — BP 155/75 | HR 62 | Temp 97.8°F | Resp 18 | Wt 163.0 lb

## 2015-09-22 DIAGNOSIS — Z23 Encounter for immunization: Secondary | ICD-10-CM | POA: Diagnosis not present

## 2015-09-22 DIAGNOSIS — D801 Nonfamilial hypogammaglobulinemia: Secondary | ICD-10-CM | POA: Insufficient documentation

## 2015-09-22 DIAGNOSIS — D869 Sarcoidosis, unspecified: Secondary | ICD-10-CM | POA: Diagnosis not present

## 2015-09-22 DIAGNOSIS — C8590 Non-Hodgkin lymphoma, unspecified, unspecified site: Secondary | ICD-10-CM | POA: Insufficient documentation

## 2015-09-22 DIAGNOSIS — D800 Hereditary hypogammaglobulinemia: Secondary | ICD-10-CM | POA: Insufficient documentation

## 2015-09-22 DIAGNOSIS — C8338 Diffuse large B-cell lymphoma, lymph nodes of multiple sites: Secondary | ICD-10-CM

## 2015-09-22 LAB — CBC WITH DIFFERENTIAL/PLATELET
BASOS PCT: 0 %
Basophils Absolute: 0 10*3/uL (ref 0.0–0.1)
EOS ABS: 0.1 10*3/uL (ref 0.0–0.7)
EOS PCT: 1 %
HCT: 39 % (ref 36.0–46.0)
Hemoglobin: 12.8 g/dL (ref 12.0–15.0)
LYMPHS ABS: 0.6 10*3/uL — AB (ref 0.7–4.0)
Lymphocytes Relative: 6 %
MCH: 31.1 pg (ref 26.0–34.0)
MCHC: 32.8 g/dL (ref 30.0–36.0)
MCV: 94.9 fL (ref 78.0–100.0)
MONO ABS: 0.5 10*3/uL (ref 0.1–1.0)
MONOS PCT: 5 %
Neutro Abs: 9.6 10*3/uL — ABNORMAL HIGH (ref 1.7–7.7)
Neutrophils Relative %: 88 %
Platelets: 145 10*3/uL — ABNORMAL LOW (ref 150–400)
RBC: 4.11 MIL/uL (ref 3.87–5.11)
RDW: 14.4 % (ref 11.5–15.5)
WBC: 10.8 10*3/uL — ABNORMAL HIGH (ref 4.0–10.5)

## 2015-09-22 LAB — COMPREHENSIVE METABOLIC PANEL
ALBUMIN: 3.7 g/dL (ref 3.5–5.0)
ALT: 18 U/L (ref 14–54)
ANION GAP: 7 (ref 5–15)
AST: 28 U/L (ref 15–41)
Alkaline Phosphatase: 93 U/L (ref 38–126)
BUN: 12 mg/dL (ref 6–20)
CALCIUM: 8.7 mg/dL — AB (ref 8.9–10.3)
CO2: 29 mmol/L (ref 22–32)
Chloride: 101 mmol/L (ref 101–111)
Creatinine, Ser: 0.94 mg/dL (ref 0.44–1.00)
GFR calc non Af Amer: >60 mL/min (ref >60)
GLUCOSE: 139 mg/dL — AB (ref 65–99)
POTASSIUM: 3.9 mmol/L (ref 3.5–5.1)
SODIUM: 137 mmol/L (ref 135–145)
TOTAL PROTEIN: 7.1 g/dL (ref 6.5–8.1)
Total Bilirubin: 0.6 mg/dL (ref 0.3–1.2)

## 2015-09-22 MED ORDER — ACETAMINOPHEN 325 MG PO TABS: 650.0000 mg | ORAL_TABLET | Freq: Four times a day (QID) | ORAL | Status: DC | PRN

## 2015-09-22 MED ORDER — DIPHENHYDRAMINE HCL 25 MG PO CAPS
25.0000 mg | ORAL_CAPSULE | Freq: Once | ORAL | Status: DC
  Filled 2015-09-22: qty 1

## 2015-09-22 MED ORDER — ACETAMINOPHEN 325 MG PO TABS
ORAL_TABLET | ORAL | Status: AC
  Filled 2015-09-22: qty 2

## 2015-09-22 MED ORDER — IMMUNE GLOBULIN (HUMAN) 10 GM/100ML IV SOLN: 50.0000 g | Freq: Once | INTRAVENOUS | Status: DC

## 2015-09-22 MED ORDER — SODIUM CHLORIDE 0.9 % IJ SOLN: 10.0000 mL | INTRAMUSCULAR | Status: DC | PRN

## 2015-09-22 MED ORDER — DEXTROSE 5 % IV SOLN
INTRAVENOUS | Status: DC
  Administered 2015-09-22: 10:00:00 via INTRAVENOUS

## 2015-09-22 MED ORDER — IMMUNE GLOBULIN (HUMAN) 20 GM/200ML IV SOLN
50.0000 g | Freq: Once | INTRAVENOUS | Status: AC
  Administered 2015-09-22: 50 g via INTRAVENOUS
  Filled 2015-09-22: qty 100

## 2015-09-22 MED ORDER — DIPHENHYDRAMINE HCL 25 MG PO CAPS
ORAL_CAPSULE | ORAL | Status: AC
  Filled 2015-09-22: qty 1

## 2015-09-22 MED ORDER — INFLUENZA VAC SPLIT QUAD 0.5 ML IM SUSY
0.5000 mL | PREFILLED_SYRINGE | Freq: Once | INTRAMUSCULAR | Status: AC
  Administered 2015-09-22: 0.5 mL via INTRAMUSCULAR
  Filled 2015-09-22: qty 0.5

## 2015-09-22 NOTE — Patient Instructions (Signed)
[place] at [facility] Discharge Instructions  RECOMMENDATIONS MADE BY THE CONSULTANT AND ANY TEST RESULTS WILL BE SENT TO YOUR REFERRING PHYSICIAN.  IVIG infusion today. Influenza vaccination today. Return as scheduled for lab work and infusion.  Return as scheduled for office visit.    Thank you for choosing [place] at [facility] to provide your oncology and hematology care.  To afford each patient quality time with our provider, please arrive at least 15 minutes before your scheduled appointment time.   Beginning January 23rd 2017 lab work for the [facility] will be done in the  Main lab at [facility] on 1st floor. If you have a lab appointment with the [place] please come in thru the  Main Entrance and check in at the main information desk  You need to re-schedule your appointment should you arrive 10 or more minutes late.  We strive to give you quality time with our providers, and arriving late affects you and other patients whose appointments are after yours.  Also, if you no show three or more times for appointments you may be dismissed from the clinic at the providers discretion.     Again, thank you for choosing [facility].  Our hope is that these requests will decrease the amount of time that you wait before being seen by our physicians.       _____________________________________________________________  Should you have questions after your visit to [facility], please contact our office at [phone] between the hours of 8:30 a.m. and 4:30 p.m.  Voicemails left after 4:30 p.m. will not be returned until the following business day.  For prescription refill requests, have your pharmacy contact our office.         Resources For Cancer Patients and their Caregivers ? American Cancer Society: Can assist with transportation, wigs, general needs, runs Look Good Feel Better.         [phone] ? Cancer Care: Provides financial assistance, online support groups, medication/co-pay assistance.  1-800-813-HOPE 907-815-2008) ? [place] Assists [place] cancer patients and their families through emotional , educational and financial support.  956-213-8807 ? [place] DSS Where to apply for food stamps, Medicaid and utility assistance. 773-693-7921 ? RCATS: Transportation to medical appointments. 321-448-3095 ? Social Security Administration: May apply for disability if have a Stage IV cancer. 585-217-6069 [phone] ? [employer], Disability and Transit Services: Assists with nutrition, care and transit needs. [place] Support Programs: @10RELATIVEDAYS @ > Cancer Support Group  2nd Tuesday of the month 1pm-2pm, Journey Room  > Creative Journey  3rd Tuesday of the month 1130am-1pm, Journey Room  > Look Good Feel Better  1st Wednesday of the month 10am-12 noon, Journey Room (Call [place] to register [phone])

## 2015-09-22 NOTE — Progress Notes (Signed)
1047:  [name] presents today for injection per the provider's orders.  Fluarix administration without incident; see MAR for injection details.  Patient tolerated procedure well and without incident.  No questions or complaints noted at this time.  1315:  Tolerated infusion w/o adverse reaction.  Alert, in no distress.  VSS.  Discharged ambulatory.

## 2015-10-07 ENCOUNTER — Other Ambulatory Visit (HOSPITAL_COMMUNITY): Payer: Self-pay | Admitting: Hematology & Oncology

## 2015-10-20 ENCOUNTER — Encounter (HOSPITAL_COMMUNITY): Payer: Medicare Other | Attending: Hematology and Oncology

## 2015-10-20 ENCOUNTER — Encounter (HOSPITAL_COMMUNITY): Payer: Medicare Other

## 2015-10-20 VITALS — BP 170/73 | HR 55 | Temp 98.1°F | Resp 18 | Wt 161.2 lb

## 2015-10-20 DIAGNOSIS — D801 Nonfamilial hypogammaglobulinemia: Secondary | ICD-10-CM

## 2015-10-20 DIAGNOSIS — C8338 Diffuse large B-cell lymphoma, lymph nodes of multiple sites: Secondary | ICD-10-CM

## 2015-10-20 DIAGNOSIS — C8590 Non-Hodgkin lymphoma, unspecified, unspecified site: Secondary | ICD-10-CM | POA: Insufficient documentation

## 2015-10-20 DIAGNOSIS — D869 Sarcoidosis, unspecified: Secondary | ICD-10-CM | POA: Diagnosis not present

## 2015-10-20 DIAGNOSIS — D800 Hereditary hypogammaglobulinemia: Secondary | ICD-10-CM | POA: Diagnosis not present

## 2015-10-20 LAB — CBC WITH DIFFERENTIAL/PLATELET
BASOS ABS: 0 10*3/uL (ref 0.0–0.1)
BASOS PCT: 0 %
EOS ABS: 0 10*3/uL (ref 0.0–0.7)
Eosinophils Relative: 0 %
HCT: 39.9 % (ref 36.0–46.0)
HEMOGLOBIN: 13 g/dL (ref 12.0–15.0)
Lymphocytes Relative: 10 %
Lymphs Abs: 0.8 10*3/uL (ref 0.7–4.0)
MCH: 31.3 pg (ref 26.0–34.0)
MCHC: 32.6 g/dL (ref 30.0–36.0)
MCV: 95.9 fL (ref 78.0–100.0)
Monocytes Absolute: 0.4 10*3/uL (ref 0.1–1.0)
Monocytes Relative: 5 %
NEUTROS PCT: 85 %
Neutro Abs: 6.7 10*3/uL (ref 1.7–7.7)
Platelets: 156 10*3/uL (ref 150–400)
RBC: 4.16 MIL/uL (ref 3.87–5.11)
RDW: 13.9 % (ref 11.5–15.5)
WBC: 7.9 10*3/uL (ref 4.0–10.5)

## 2015-10-20 LAB — COMPREHENSIVE METABOLIC PANEL
ALBUMIN: 3.8 g/dL (ref 3.5–5.0)
ALK PHOS: 95 U/L (ref 38–126)
ALT: 14 U/L (ref 14–54)
ANION GAP: 8 (ref 5–15)
AST: 23 U/L (ref 15–41)
BUN: 11 mg/dL (ref 6–20)
CALCIUM: 8.7 mg/dL — AB (ref 8.9–10.3)
CO2: 29 mmol/L (ref 22–32)
CREATININE: 0.9 mg/dL (ref 0.44–1.00)
Chloride: 102 mmol/L (ref 101–111)
GFR calc Af Amer: >60 mL/min (ref >60)
GFR calc non Af Amer: >60 mL/min (ref >60)
GLUCOSE: 130 mg/dL — AB (ref 65–99)
Potassium: 3.5 mmol/L (ref 3.5–5.1)
SODIUM: 139 mmol/L (ref 135–145)
Total Bilirubin: 0.5 mg/dL (ref 0.3–1.2)
Total Protein: 7.1 g/dL (ref 6.5–8.1)

## 2015-10-20 MED ORDER — DIPHENHYDRAMINE HCL 25 MG PO CAPS: 25.0000 mg | ORAL_CAPSULE | Freq: Once | ORAL | Status: DC

## 2015-10-20 MED ORDER — IMMUNE GLOBULIN (HUMAN) 10 GM/100ML IV SOLN
50.0000 g | Freq: Once | INTRAVENOUS | Status: AC
  Administered 2015-10-20: 50 g via INTRAVENOUS
  Filled 2015-10-20: qty 100

## 2015-10-20 MED ORDER — DIPHENHYDRAMINE HCL 25 MG PO TABS
25.0000 mg | ORAL_TABLET | Freq: Once | ORAL | Status: DC
  Filled 2015-10-20 (×2): qty 1

## 2015-10-20 MED ORDER — ACETAMINOPHEN 325 MG PO TABS: 650.0000 mg | ORAL_TABLET | Freq: Four times a day (QID) | ORAL | Status: DC | PRN

## 2015-10-20 MED ORDER — SODIUM CHLORIDE 0.9 % IJ SOLN: 10.0000 mL | INTRAMUSCULAR | Status: DC | PRN

## 2015-10-20 MED ORDER — DEXTROSE 5 % IV SOLN
INTRAVENOUS | Status: DC
  Administered 2015-10-20: 11:00:00 via INTRAVENOUS

## 2015-10-20 NOTE — Progress Notes (Signed)
[name] tolerated IVIG well without complaints or incident. Pt reported small red rash noted above both ankles that she noticed for a week or so and was shown to PA today. Pt denied any pain or itching to these areas Labs reviewed with Platelets stable. VSS upon discharge. Pt discharged self ambulatory in satisfactory condition

## 2015-10-20 NOTE — Patient Instructions (Signed)
[place] at [facility] Discharge Instructions  RECOMMENDATIONS MADE BY THE CONSULTANT AND ANY TEST RESULTS WILL BE SENT TO YOUR REFERRING PHYSICIAN.  Received IVIG today. Follow-up as scheduled. Call clinic for any questions or concerns  Thank you for choosing [place] at [facility] to provide your oncology and hematology care.  To afford each patient quality time with our provider, please arrive at least 15 minutes before your scheduled appointment time.   Beginning January 23rd 2017 lab work for the [facility] will be done in the  Main lab at [facility] on 1st floor. If you have a lab appointment with the [place] please come in thru the  Main Entrance and check in at the main information desk  You need to re-schedule your appointment should you arrive 10 or more minutes late.  We strive to give you quality time with our providers, and arriving late affects you and other patients whose appointments are after yours.  Also, if you no show three or more times for appointments you may be dismissed from the clinic at the providers discretion.     Again, thank you for choosing [facility].  Our hope is that these requests will decrease the amount of time that you wait before being seen by our physicians.       _____________________________________________________________  Should you have questions after your visit to [facility], please contact our office at [phone] between the hours of 8:30 a.m. and 4:30 p.m.  Voicemails left after 4:30 p.m. will not be returned until the following business day.  For prescription refill requests, have your pharmacy contact our office.         Resources For Cancer Patients and their Caregivers ? American Cancer Society: Can assist with transportation, wigs, general needs, runs Look Good Feel Better.        503-643-6563 ? Cancer Care: Provides financial assistance,  online support groups, medication/co-pay assistance.  1-800-813-HOPE (504)183-4597) ? [place] Assists [place] cancer patients and their families through emotional , educational and financial support.  365-295-3131 ? [place] DSS Where to apply for food stamps, Medicaid and utility assistance. 916-256-0494 ? RCATS: Transportation to medical appointments. 225 441 0226 ? Social Security Administration: May apply for disability if have a Stage IV cancer. 838-735-2029 [phone] ? [employer], Disability and Transit Services: Assists with nutrition, care and transit needs. [place] Support Programs: @10RELATIVEDAYS @ > Cancer Support Group  2nd Tuesday of the month 1pm-2pm, Journey Room  > Creative Journey  3rd Tuesday of the month 1130am-1pm, Journey Room  > Look Good Feel Better  1st Wednesday of the month 10am-12 noon, Journey Room (Call [place] to register [phone])

## 2015-11-11 ENCOUNTER — Other Ambulatory Visit: Payer: Self-pay | Admitting: Critical Care Medicine

## 2015-11-17 ENCOUNTER — Encounter (HOSPITAL_COMMUNITY): Payer: Medicare Other | Attending: Hematology and Oncology | Admitting: Hematology & Oncology

## 2015-11-17 ENCOUNTER — Encounter (HOSPITAL_COMMUNITY): Payer: Medicare Other

## 2015-11-17 ENCOUNTER — Encounter (HOSPITAL_BASED_OUTPATIENT_CLINIC_OR_DEPARTMENT_OTHER): Payer: Medicare Other

## 2015-11-17 ENCOUNTER — Encounter (HOSPITAL_COMMUNITY): Payer: Self-pay | Admitting: Hematology & Oncology

## 2015-11-17 ENCOUNTER — Encounter: Payer: Self-pay | Admitting: *Deleted

## 2015-11-17 VITALS — BP 156/79 | HR 64 | Temp 97.8°F | Resp 16 | Wt 160.8 lb

## 2015-11-17 DIAGNOSIS — Z8572 Personal history of non-Hodgkin lymphomas: Secondary | ICD-10-CM

## 2015-11-17 DIAGNOSIS — D800 Hereditary hypogammaglobulinemia: Secondary | ICD-10-CM | POA: Diagnosis not present

## 2015-11-17 DIAGNOSIS — J40 Bronchitis, not specified as acute or chronic: Secondary | ICD-10-CM | POA: Diagnosis not present

## 2015-11-17 DIAGNOSIS — C8338 Diffuse large B-cell lymphoma, lymph nodes of multiple sites: Secondary | ICD-10-CM

## 2015-11-17 DIAGNOSIS — C8590 Non-Hodgkin lymphoma, unspecified, unspecified site: Secondary | ICD-10-CM | POA: Insufficient documentation

## 2015-11-17 DIAGNOSIS — D801 Nonfamilial hypogammaglobulinemia: Secondary | ICD-10-CM

## 2015-11-17 DIAGNOSIS — Z7901 Long term (current) use of anticoagulants: Secondary | ICD-10-CM

## 2015-11-17 DIAGNOSIS — D508 Other iron deficiency anemias: Secondary | ICD-10-CM

## 2015-11-17 DIAGNOSIS — J45909 Unspecified asthma, uncomplicated: Secondary | ICD-10-CM

## 2015-11-17 DIAGNOSIS — J209 Acute bronchitis, unspecified: Secondary | ICD-10-CM

## 2015-11-17 DIAGNOSIS — D649 Anemia, unspecified: Secondary | ICD-10-CM

## 2015-11-17 DIAGNOSIS — D869 Sarcoidosis, unspecified: Secondary | ICD-10-CM | POA: Insufficient documentation

## 2015-11-17 DIAGNOSIS — J329 Chronic sinusitis, unspecified: Secondary | ICD-10-CM

## 2015-11-17 LAB — CBC WITH DIFFERENTIAL/PLATELET
Basophils Absolute: 0 10*3/uL (ref 0.0–0.1)
Basophils Relative: 0 %
Eosinophils Absolute: 0.1 10*3/uL (ref 0.0–0.7)
Eosinophils Relative: 1 %
HEMATOCRIT: 40 % (ref 36.0–46.0)
Hemoglobin: 12.9 g/dL (ref 12.0–15.0)
LYMPHS ABS: 1.2 10*3/uL (ref 0.7–4.0)
LYMPHS PCT: 12 %
MCH: 31.1 pg (ref 26.0–34.0)
MCHC: 32.3 g/dL (ref 30.0–36.0)
MCV: 96.4 fL (ref 78.0–100.0)
MONO ABS: 0.7 10*3/uL (ref 0.1–1.0)
MONOS PCT: 7 %
NEUTROS ABS: 8.3 10*3/uL — AB (ref 1.7–7.7)
Neutrophils Relative %: 80 %
Platelets: 179 10*3/uL (ref 150–400)
RBC: 4.15 MIL/uL (ref 3.87–5.11)
RDW: 13.6 % (ref 11.5–15.5)
WBC: 10.4 10*3/uL (ref 4.0–10.5)

## 2015-11-17 LAB — COMPREHENSIVE METABOLIC PANEL
ALK PHOS: 92 U/L (ref 38–126)
ALT: 12 U/L — ABNORMAL LOW (ref 14–54)
ANION GAP: 8 (ref 5–15)
AST: 19 U/L (ref 15–41)
Albumin: 3.7 g/dL (ref 3.5–5.0)
BILIRUBIN TOTAL: 0.4 mg/dL (ref 0.3–1.2)
BUN: 11 mg/dL (ref 6–20)
CALCIUM: 8.8 mg/dL — AB (ref 8.9–10.3)
CO2: 30 mmol/L (ref 22–32)
Chloride: 99 mmol/L — ABNORMAL LOW (ref 101–111)
Creatinine, Ser: 0.92 mg/dL (ref 0.44–1.00)
GFR calc Af Amer: >60 mL/min (ref >60)
Glucose, Bld: 146 mg/dL — ABNORMAL HIGH (ref 65–99)
POTASSIUM: 3.3 mmol/L — AB (ref 3.5–5.1)
Sodium: 137 mmol/L (ref 135–145)
TOTAL PROTEIN: 6.8 g/dL (ref 6.5–8.1)

## 2015-11-17 MED ORDER — ACETAMINOPHEN 325 MG PO TABS: 650.0000 mg | ORAL_TABLET | Freq: Four times a day (QID) | ORAL | Status: DC | PRN

## 2015-11-17 MED ORDER — ACETAMINOPHEN 325 MG PO TABS
ORAL_TABLET | ORAL | Status: AC
  Filled 2015-11-17: qty 2

## 2015-11-17 MED ORDER — DEXTROSE 5 % IV SOLN
INTRAVENOUS | Status: DC
  Administered 2015-11-17: 11:00:00 via INTRAVENOUS

## 2015-11-17 MED ORDER — IMMUNE GLOBULIN (HUMAN) 10 GM/100ML IV SOLN
50.0000 g | Freq: Once | INTRAVENOUS | Status: AC
  Administered 2015-11-17: 50 g via INTRAVENOUS
  Filled 2015-11-17: qty 100

## 2015-11-17 MED ORDER — DIPHENHYDRAMINE HCL 25 MG PO CAPS: 25.0000 mg | ORAL_CAPSULE | Freq: Once | ORAL | Status: DC

## 2015-11-17 MED ORDER — DIPHENHYDRAMINE HCL 25 MG PO CAPS
ORAL_CAPSULE | ORAL | Status: AC
  Filled 2015-11-17: qty 1

## 2015-11-17 MED ORDER — CIPROFLOXACIN HCL 500 MG PO TABS: 500.0000 mg | ORAL_TABLET | Freq: Two times a day (BID) | ORAL | 0 refills | Status: AC

## 2015-11-17 MED ORDER — DIPHENHYDRAMINE HCL 25 MG PO TABS
25.0000 mg | ORAL_TABLET | Freq: Once | ORAL | Status: DC
  Filled 2015-11-17: qty 1

## 2015-11-17 NOTE — Progress Notes (Signed)
[facility] Clinical Social Work  Clinical Social Work was referred by CSW rounding in the infusion area for assessment of psychosocial needs. Clinical Social Worker met with patient at [facility] briefly to introduce self, explain role of CSW,  to offer support and assess for needs.  No needs identified currently. Pt agrees to reach out as needed.     Clinical Social Work interventions: Resource education  Grier Calle Schader, LCSW [facility] Tuesdays   [phone]  

## 2015-11-17 NOTE — Patient Instructions (Signed)
[place] at [facility] Discharge Instructions  RECOMMENDATIONS MADE BY THE CONSULTANT AND ANY TEST RESULTS WILL BE SENT TO YOUR REFERRING PHYSICIAN.  You saw Dr.Penland today. Follow up in 3 months with lab work. Cipro prescription was sent to your pharmacy- [facility] See Amy at checkout for appointments.  Thank you for choosing [place] at [facility] to provide your oncology and hematology care.  To afford each patient quality time with our provider, please arrive at least 15 minutes before your scheduled appointment time.   Beginning January 23rd 2017 lab work for the [facility] will be done in the  Main lab at [facility] on 1st floor. If you have a lab appointment with the [place] please come in thru the  Main Entrance and check in at the main information desk  You need to re-schedule your appointment should you arrive 10 or more minutes late.  We strive to give you quality time with our providers, and arriving late affects you and other patients whose appointments are after yours.  Also, if you no show three or more times for appointments you may be dismissed from the clinic at the providers discretion.     Again, thank you for choosing [facility].  Our hope is that these requests will decrease the amount of time that you wait before being seen by our physicians.       _____________________________________________________________  Should you have questions after your visit to [facility], please contact our office at [phone] between the hours of 8:30 a.m. and 4:30 p.m.  Voicemails left after 4:30 p.m. will not be returned until the following business day.  For prescription refill requests, have your pharmacy contact our office.         Resources For Cancer Patients and their Caregivers ? American Cancer Society: Can assist with transportation, wigs, general needs, runs Look Good  Feel Better.        [phone] ? Cancer Care: Provides financial assistance, online support groups, medication/co-pay assistance.  1-800-813-HOPE 503 195 5775) ? [place] Assists [place] cancer patients and their families through emotional , educational and financial support.  (503) 337-1273 ? [place] DSS Where to apply for food stamps, Medicaid and utility assistance. 513-172-1822 ? RCATS: Transportation to medical appointments. 925-364-5025 ? Social Security Administration: May apply for disability if have a Stage IV cancer. 774-819-1760 (939) 170-1010 ? [employer], Disability and Transit Services: Assists with nutrition, care and transit needs. [place] Support Programs: @10RELATIVEDAYS @ > Cancer Support Group  2nd Tuesday of the month 1pm-2pm, Journey Room  > Creative Journey  3rd Tuesday of the month 1130am-1pm, Journey Room  > Look Good Feel Better  1st Wednesday of the month 10am-12 noon, Journey Room (Call [place] to register [phone])

## 2015-11-17 NOTE — Progress Notes (Signed)
[name], MD [place] [id number]    Hypogammaglobulenemia/IgG deficiency with level of 414 mg/dl Maintenance Rituxan 09/30/2011 Hx of Diffuse large B-Cell Lymphoma, S/P R-CHOP x 4 cycles with intrathecal chemotherapy during cycles 3 and 4 with methotrexate and Solu-Cortef. Treatment was stopped due to Pseudomonas sepsis after cycles 2 and 4 which was very severe  CURRENT THERAPY:50 g IV IgG monthly for IgG deficiency.  INTERVAL HISTORY: [name] 60 y.o. female returns for IgG deficiency.   She is unaccompanied and here for ongoing IVIG.   Patient has been experiencing a cough and although chronic, it is worse. She also had a fever last past week. She uses albuterol if she needs it.  She denies issues with her bowels and reports a normal appetite.   [name] has had flu shot and Prevnar 13. She is up to date on her mammogram. She notes other than her pulmonary issues she feels fairly well. No problems with her IVIG infusions.    MEDICAL HISTORY: Past Medical History:  Diagnosis Date  . Allergic rhinitis   . Anemia   . Anxiety   . Asthma   . B12 deficiency 01/18/2015  . Cavitary lung disease   . Chronic kidney disease   . Chronic sinusitis   . Endotracheally intubated   . GERD (gastroesophageal reflux disease)   . Hypogammaglobulinemia, acquired ([place]) 12/30/2009   Qualifier: Diagnosis of  By: Joya Gaskins MD, Burnett Harry   . Iron deficiency anemia 01/18/2015  . Myocardial infarction 2011  . Narcolepsy without cataplexy 01/18/2015  . Non Hodgkin's lymphoma ([place]) 2011  . Orofacial dyskinesia 04/22/2015  . Pneumonia 01/2013  . PONV (postoperative nausea and vomiting)   . Respiratory failure ([place])   . 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     is allergic to meperidine hcl and montelukast sodium.  [name] does not currently have medications on file.  SURGICAL HISTORY: Past Surgical History:  Procedure Laterality Date  . ABDOMINAL HYSTERECTOMY    . BASAL CELL CARCINOMA EXCISION  03/2011   scalp  . BREAST SURGERY Right   . CATARACT EXTRACTION W/PHACO Left 11/17/2014   Procedure: CATARACT EXTRACTION PHACO AND INTRAOCULAR LENS PLACEMENT LEFT EYE;  Surgeon: Tonny Branch, MD;  Location: [facility] ORS;  Service: Ophthalmology;  Laterality: Left;  CDE:5.60  . CATARACT EXTRACTION W/PHACO Right 12/15/2014   Procedure: CATARACT EXTRACTION PHACO AND INTRAOCULAR LENS PLACEMENT RIGHT EYE CDE=5.16;  Surgeon: Tonny Branch, MD;  Location: [facility] ORS;  Service: Ophthalmology;  Laterality: Right;  . COLONOSCOPY N/A 11/14/2012   Procedure: COLONOSCOPY;  Surgeon: Rogene Houston, MD;  Location: [facility] ENDO SUITE;  Service: Endoscopy;  Laterality: N/A;  830  . NASAL SINUS SURGERY    . NECK SURGERY     x 2   . PERIPHERALLY INSERTED CENTRAL CATHETER INSERTION    . PICC Removal    . PORT-A-CATH REMOVAL    . PORTACATH PLACEMENT  7/11   Removed 6/12  . TRACHEOSTOMY    . VESICOVAGINAL FISTULA CLOSURE W/ TAH      Outpatient Encounter Prescriptions as  of 11/12/2014  Medication Sig Note  . acetaminophen (TYLENOL) 500 MG tablet Take 1,000 mg by mouth every 6 (six) hours as needed for pain or fever.    [name] Kitchen acyclovir (ZOVIRAX) 400 MG tablet TAKE ONE TABLET BY MOUTH TWICE DAILY.   [name] Kitchen albuterol (PROVENTIL) (2.5 MG/3ML) 0.083% nebulizer solution Take 2.5 mg by nebulization every 4 (four) hours as needed for wheezing  or shortness of breath.   . Armodafinil (NUVIGIL) 150 MG tablet Take 150 mg by mouth daily.   . citalopram (CELEXA) 20 MG tablet Take 20 mg by mouth every morning.     . furosemide (LASIX) 20 MG tablet Take 20 mg by mouth as needed for fluid.  04/02/2014: Received from: External Pharmacy Received Sig:   . gabapentin (NEURONTIN) 300 MG capsule TAKE 1 CAPSULE BY MOUTH FOUR TIMES DAILY.   . mometasone-formoterol (DULERA) 200-5 MCG/ACT AERO Inhale 2 puffs into the lungs 2 (two) times daily.   [name] Kitchen omeprazole (PRILOSEC) 20 MG capsule Take 1 capsule (20 mg total) by mouth 2 (two) times daily before a meal.   . predniSONE (DELTASONE) 10 MG tablet Take 4 for four days 3 for four days 2 for four days then 1 daily and STAY (Patient taking differently: Take 5 mg by mouth daily. )   . simvastatin (ZOCOR) 80 MG tablet Take 80 mg by mouth at bedtime.     [name] Kitchen tiotropium (SPIRIVA) 18 MCG inhalation capsule Place 1 capsule (18 mcg total) into inhaler and inhale daily.   . VENTOLIN HFA 108 (90 BASE) MCG/ACT inhaler INHALE 2 PUFFS INTO THE LUNGS EVERY 6 HOURS AS NEEDED.   [name] Kitchen vitamin C (ASCORBIC ACID) 500 MG tablet Take 500 mg by mouth 2 (two) times daily.   [name] Kitchen warfarin (COUMADIN) 10 MG tablet Take 5 mg by mouth every evening.    . zafirlukast (ACCOLATE) 20 MG tablet TAKE 1 TABLET BY MOUTH TWICE DAILY.   . fluticasone (FLONASE) 50 MCG/ACT nasal spray Place 2 sprays into the nose 2 (two) times daily. (Patient not taking: Reported on 11/12/2014)   . ketorolac (ACULAR) 0.5 % ophthalmic solution  11/12/2014: Received from: External Pharmacy  . levofloxacin (LEVAQUIN) 250 MG tablet Take 2 tablets on first day then one tablet daily therafter for a total of 14 days   . ofloxacin (OCUFLOX) 0.3 % ophthalmic solution  11/12/2014: Received from: External Pharmacy  . prednisoLONE acetate (PRED FORTE) 1 % ophthalmic suspension  11/12/2014: Received from: External Pharmacy   No facility-administered encounter medications on file as of  11/12/2014.    SOCIAL HISTORY: Social History   Social History  . Marital status: Divorced    Spouse name: N/A  . Number of children: 3  . Years of education: N/A   Occupational History  . [employer]   Social History Main Topics  . Smoking status: Never Smoker  . Smokeless tobacco: Never Used  . Alcohol use No  . Drug use: No  . Sexual activity: No   Other Topics Concern  . Not on file   Social History Narrative   Originally from [place]. Always lived in [place]. Prior travel to [place]. 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 She has 1 dog currently. She has 2 conures (small parrots). No mold exposure in her home. 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 No hot tub exposure. She enjoys sewing & quilting.  FAMILY HISTORY: Family History  Problem Relation Age of Onset  . Emphysema Mother   . Stroke Mother   . Allergies Father   . Asthma Father     as a child  . Leukemia Maternal Grandmother   . Diabetes Brother     Review of Systems  Constitutional: Positive for fever. Negative for chills, weight loss and malaise/fatigue.  HENT: Positive for congestion and sinus drainage, hearing loss, nosebleeds, sore throat and tinnitus.   Eyes: Negative for blurred vision, double vision, pain and discharge.  Respiratory: Positive for cough, Negative for hemoptysis, sputum production, shortness of breath and wheezing.   Cardiovascular: Negative for chest pain, palpitations, claudication, leg swelling and PND.  Gastrointestinal: Negative for heartburn, nausea, vomiting, abdominal pain, diarrhea, constipation, blood in stool and melena.  Genitourinary: Negative for dysuria, urgency, frequency and hematuria.  Musculoskeletal: Negative for myalgias, joint pain and falls.  Skin: Negative for itching and rash.  Neurological: Negative for dizziness, tingling, tremors, sensory change,  speech change, focal weakness, seizures, loss of consciousness, weakness and headaches.  Endo/Heme/Allergies: Does not bleed easily.  Psychiatric/Behavioral: Negative for depression, suicidal ideas, memory loss and substance abuse. The patient is not nervous/anxious and does not have insomnia.   14 point review of systems was performed and is negative except as detailed under history of present illness and above  PHYSICAL EXAMINATION  ECOG PERFORMANCE STATUS: 0 - Asymptomatic   Vitals with BMI 11/17/2015  Height   Weight 160 lbs 13 oz  BMI   Systolic [id number]  Diastolic 78  Pulse 67  Respirations 18   Physical Exam  Constitutional: She is oriented to person, place, and time and well-developed, well-nourished, and in no distress.  HENT:  Head: Normocephalic and atraumatic.  Nose: Nose normal.  Mouth/Throat: Oropharynx is clear and moist. No oropharyngeal exudate.  Eyes: Conjunctivae and EOM are normal. Pupils are equal, round, and reactive to light. Right eye exhibits no discharge. Left eye exhibits no discharge. No scleral icterus.  Neck: Normal range of motion. Neck supple. No tracheal deviation present. No thyromegaly present.  Cardiovascular: Normal rate, regular rhythm and normal heart sounds.  Exam reveals no gallop and no friction rub.   No murmur heard. Pulmonary/Chest:   Bilateral, coarse rhonchi L>R Abdominal: Soft. Bowel sounds are normal. She exhibits no distension and no mass. There is no tenderness. There is no rebound and no guarding.  Musculoskeletal: Normal range of motion. She exhibits no edema.  Lymphadenopathy:    She has no cervical adenopathy.  Neurological: She is alert and oriented to person, place, and time. She has normal reflexes. No cranial nerve deficit. Gait normal. Coordination normal.  Skin: Skin is warm and dry. No rash noted.  Psychiatric: Mood, memory, affect and judgment normal.  Nursing note and vitals reviewed.   LABORATORY DATA: I have  reviewed the data as listed.  CBC    Component Value Date/Time   WBC 10.4 11/17/2015 0915   RBC 4.15 11/17/2015 0915   HGB 12.9 11/17/2015 0915   HCT 40.0 11/17/2015 0915   PLT 179 11/17/2015 0915   MCV 96.4 11/17/2015 0915   MCH 31.1 11/17/2015 0915   MCHC 32.3 11/17/2015 0915   RDW 13.6 11/17/2015 0915   LYMPHSABS 1.2 11/17/2015 0915   MONOABS 0.7 11/17/2015 0915   EOSABS 0.1 11/17/2015 0915   BASOSABS 0.0 11/17/2015 0915   CMP     Component Value Date/Time   NA 137 11/17/2015 0915   K 3.3 (L) 11/17/2015  0915   CL 99 (L) 11/17/2015 0915   CO2 30 11/17/2015 0915   GLUCOSE 146 (H) 11/17/2015 0915   BUN 11 11/17/2015 0915   CREATININE 0.92 11/17/2015 0915   CALCIUM 8.8 (L) 11/17/2015 0915   PROT 6.8 11/17/2015 0915   ALBUMIN 3.7 11/17/2015 0915   AST 19 11/17/2015 0915   ALT 12 (L) 11/17/2015 0915   ALKPHOS 92 11/17/2015 0915   BILITOT 0.4 11/17/2015 0915   GFRNONAA >60 11/17/2015 0915   GFRAA >60 11/17/2015 0915     RADIOLOGY: Study Result   CLINICAL DATA:  Cough fever shortness of breath for 4 days.  Asthma.  EXAM: CHEST  2 VIEW  COMPARISON:  Chest radiograph 11/13/14 and chest CT 01/16/2014  FINDINGS: The lungs are well inflated. There is atherosclerotic calcification within the aortic arch. Cardiomediastinal contours otherwise normal. Revision  No pneumothorax or sizable pleural effusion. No focal airspace consolidation. Prominent bilateral lower lobe interstitial markings are unchanged.  IMPRESSION: Unchanged lower lobe pleural parenchymal scarring and bronchiectasis. No acute cardiopulmonary disease.   Electronically Signed   By: Ulyses Jarred M.D.   On: 08/25/2015 14:31    ASSESSMENT and THERAPY PLAN:   Hypogammaglobulinemia Asthma  She will continue with IVIG.  I will see her back in 3 months. She continues to follow with pulmonary. For her symptoms over the past week I have refilled her prior antibiotic. If her symptoms worsen  she is encouraged to follow-up with her pulmonologist.  Immunoglobulin levels added today.   Diffuse large B-cell lymphoma  She remains without evidence of obvious recurrence. We will continue ongoing observation. I advised her that her disease was in 2011 and at this point she is unlikely to have a recurrence.   NCCN guidelines recommends the follow surveillance for Stage I, II for those who asertain a complete response in the first-line treatment setting (3.2017):  A. H&P every 3-6 months for 5 years, then yearly or as clinically indicated.  B. Labs every 3-6 months for 5 years and then annually or as clinically indicated.  C. Imaging only as indicated. For those ascertaining a complete response in the Stage III, IV setting in first-line treatment setting:  A. H&P every 3-6 months for 5 years, then yearly or as clinically indicated.  B. Labs every 3-6 months for 5 years and then annually or as clinically indicated.  C. CT C/A/P with contrast no more than every 6 months for 2 years after  completion of treatment; then only as indicated.   Anemia History of iron deficiency Colonoscopy in 2014 with multiple polyps Ongoing coumadin use  Will add ferritin to follow-up in one month given history of iron deficiency.   Bronchitis/Sinusitis  As noted above.   All questions were answered. The patient knows to call the clinic with any problems, questions or concerns. We can certainly see the patient much sooner if necessary.   This document serves as a record of services personally performed by Ancil Linsey, MD. It was created on her behalf by Elmyra Ricks, a trained medical scribe. The creation of this record is based on the scribe's personal observations and the provider's statements to them. This document has been checked and approved by the attending provider.  I have reviewed the above documentation for accuracy and completeness, and I agree with the above.  This note was signed  electronically.  Kelby Fam. Whitney Muse, MD

## 2015-11-18 ENCOUNTER — Encounter (HOSPITAL_COMMUNITY): Payer: Self-pay | Admitting: Hematology & Oncology

## 2015-11-18 LAB — IGG, IGA, IGM
IGA: 15 mg/dL — AB (ref 87–352)
IGG (IMMUNOGLOBIN G), SERUM: 968 mg/dL (ref 700–1600)
IgM, Serum: 108 mg/dL (ref 26–217)

## 2015-11-18 NOTE — Progress Notes (Signed)
Tolerated infusion w/o adverse reaction.  Alert, in no distress.  VSS.  Discharged ambulatory.  

## 2015-11-18 NOTE — Patient Instructions (Signed)
[place] at [facility] Discharge Instructions  RECOMMENDATIONS MADE BY THE CONSULTANT AND ANY TEST RESULTS WILL BE SENT TO YOUR REFERRING PHYSICIAN.  IVIG infusion today. Return as scheduled for infusions. Return as scheduled for office visit.   Thank you for choosing [place] at [facility] to provide your oncology and hematology care.  To afford each patient quality time with our provider, please arrive at least 15 minutes before your scheduled appointment time.   Beginning January 23rd 2017 lab work for the [facility] will be done in the  Main lab at [facility] on 1st floor. If you have a lab appointment with the [facility] please come in thru the  Main Entrance and check in at the main information desk  You need to re-schedule your appointment should you arrive 10 or more minutes late.  We strive to give you quality time with our providers, and arriving late affects you and other patients whose appointments are after yours.  Also, if you no show three or more times for appointments you may be dismissed from the clinic at the providers discretion.     Again, thank you for choosing [facility].  Our hope is that these requests will decrease the amount of time that you wait before being seen by our physicians.       _____________________________________________________________  Should you have questions after your visit to [facility], please contact our office at [phone] between the hours of 8:30 a.m. and 4:30 p.m.  Voicemails left after 4:30 p.m. will not be returned until the following business day.  For prescription refill requests, have your pharmacy contact our office.         Resources For Cancer Patients and their Caregivers ? American Cancer Society: Can assist with transportation, wigs, general needs, runs Look Good Feel Better.        408-869-3664 ? Cancer Care: Provides financial  assistance, online support groups, medication/co-pay assistance.  1-800-813-HOPE 732-632-1181) ? [place] Assists [place] cancer patients and their families through emotional , educational and financial support.  856-406-2222 ? [place] DSS Where to apply for food stamps, Medicaid and utility assistance. 432-230-0069 ? RCATS: Transportation to medical appointments. (713)068-4885 ? Social Security Administration: May apply for disability if have a Stage IV cancer. 781-504-1155 308-165-9564 ? [employer], Disability and Transit Services: Assists with nutrition, care and transit needs. [place] Support Programs: @10RELATIVEDAYS @ > Cancer Support Group  2nd Tuesday of the month 1pm-2pm, Journey Room  > Creative Journey  3rd Tuesday of the month 1130am-1pm, Journey Room  > Look Good Feel Better  1st Wednesday of the month 10am-12 noon, Journey Room (Call [place] to register [phone])

## 2015-11-20 ENCOUNTER — Other Ambulatory Visit: Payer: Self-pay

## 2015-11-20 MED ORDER — ALBUTEROL SULFATE HFA 108 (90 BASE) MCG/ACT IN AERS: 2.0000 | INHALATION_SPRAY | Freq: Four times a day (QID) | RESPIRATORY_TRACT | 2 refills | Status: DC | PRN

## 2015-11-25 IMAGING — CT CT PARANASAL SINUSES LIMITED
1 series · 15 of 21 positions shown, 19 images · non-contrast
Comparison: 02/14/2012

CLINICAL DATA: Chronic nasal congestion and history of multiple
sinus surgeries.

EXAM:
CT PARANASAL SINUS LIMITED WITHOUT CONTRAST
TECHNIQUE: Non-contiguous multidetector CT images of the paranasal sinuses were
obtained in a single plane without contrast.

[Series 3: ltd sinus 3.0 h30s · axial · 0.29mm/px · z∈[+1190,+1304]mm · 15 of 21 slices shown, 19 images]
[im 2/21  brain]
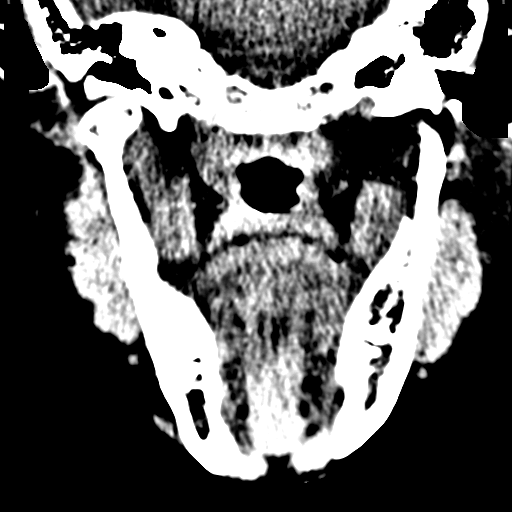
[im 2/21  bone]
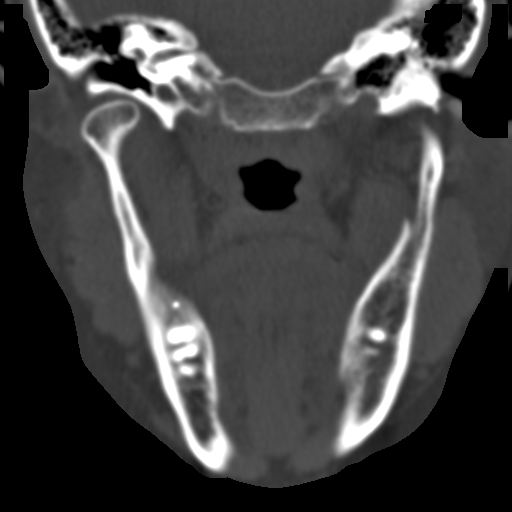
[im 3/21  bone]
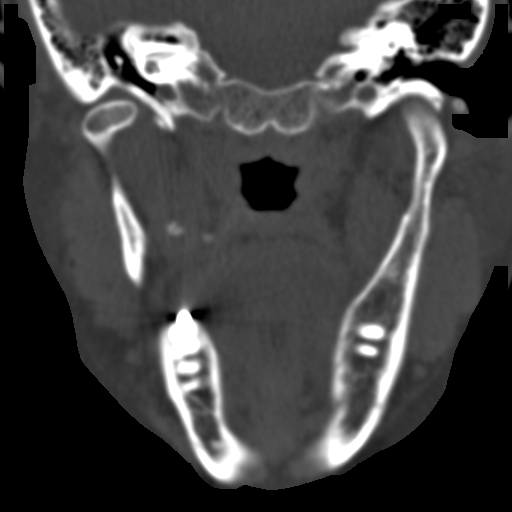
[im 5/21  bone]
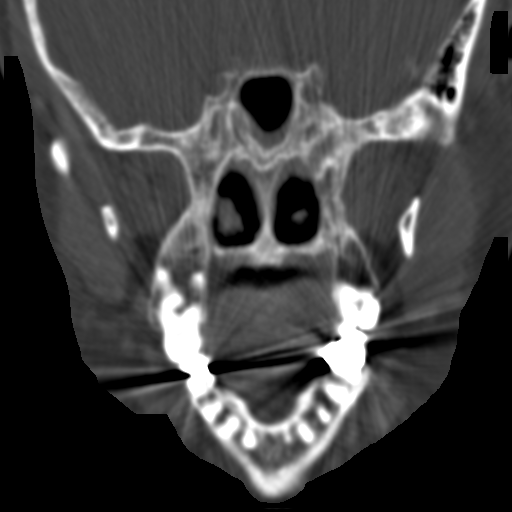
[im 6/21  bone]
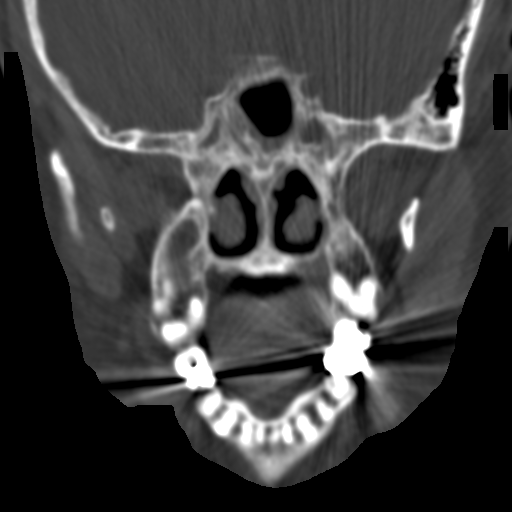
[im 7/21  brain]
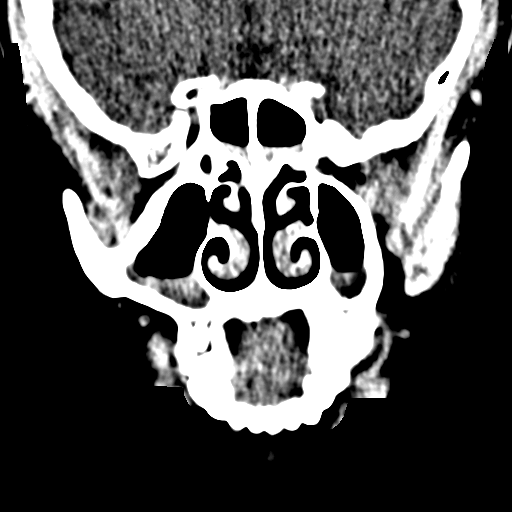
[im 7/21  bone]
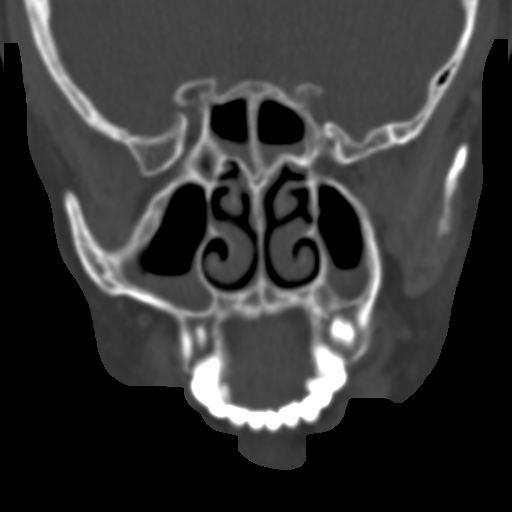
[im 8/21  bone]
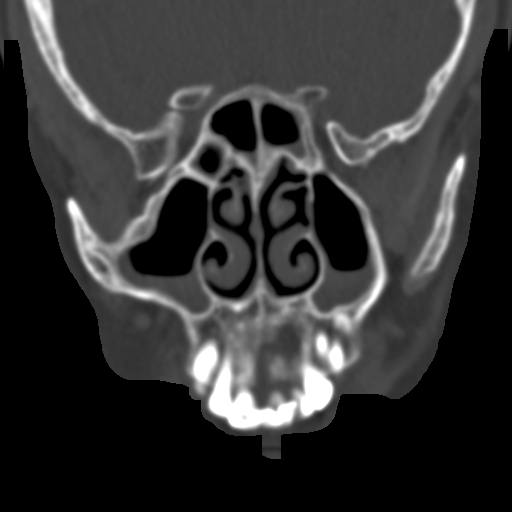
[im 10/21  bone]
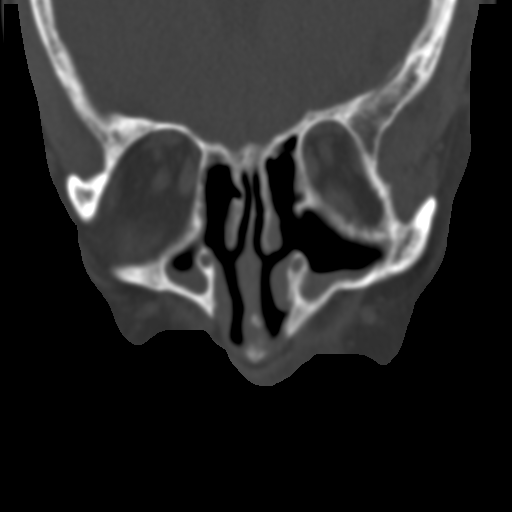
[im 11/21  bone]
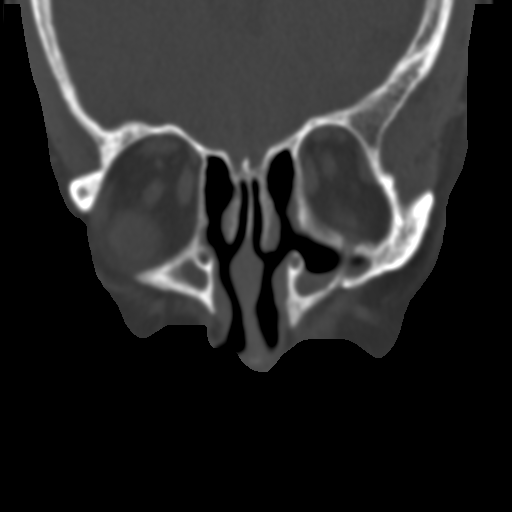
[im 12/21  brain]
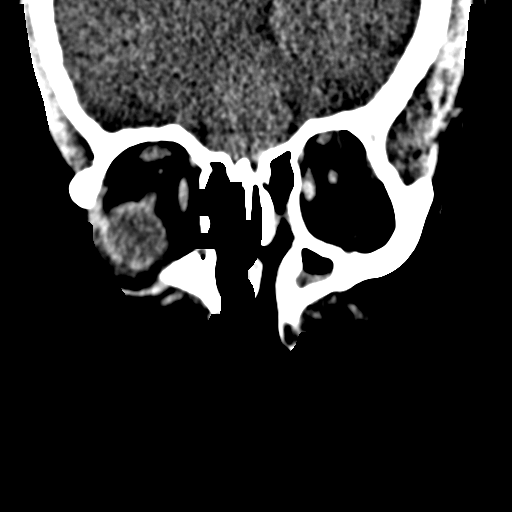
[im 12/21  bone]
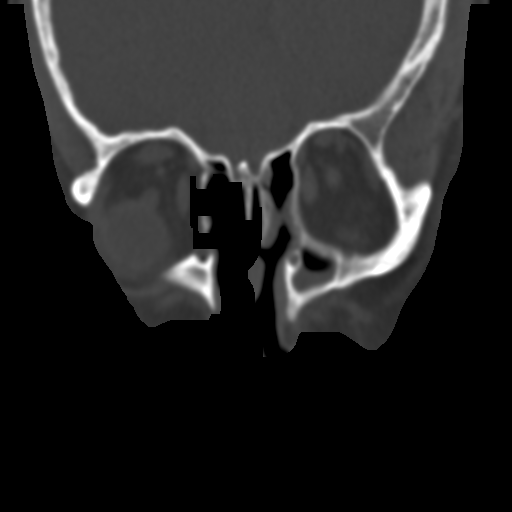
[im 14/21  bone]
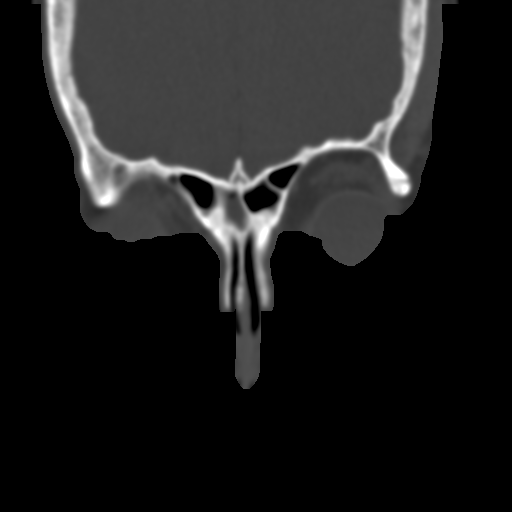
[im 15/21  bone]
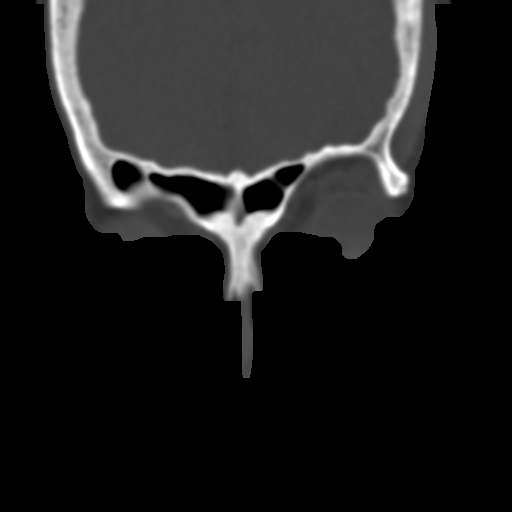
[im 16/21  bone]
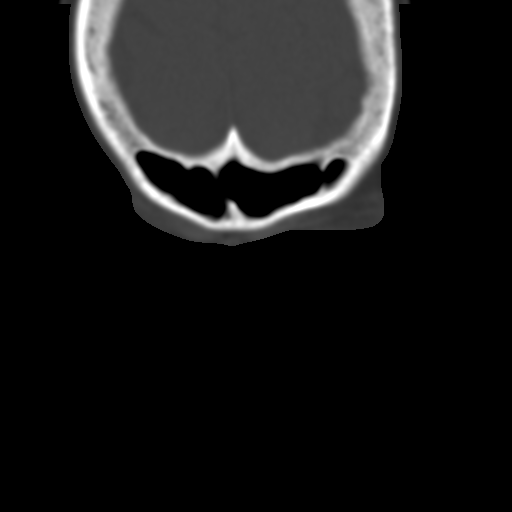
[im 17/21  brain]
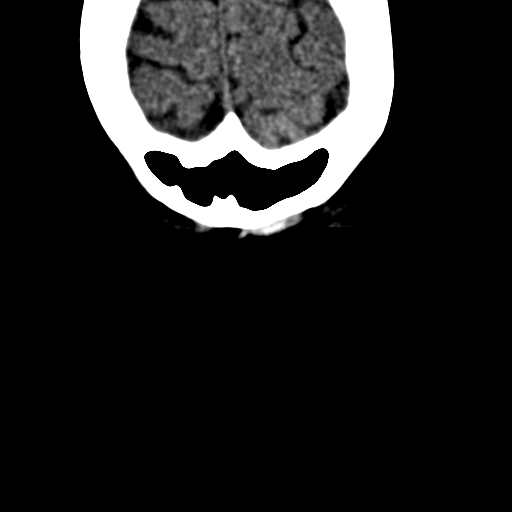
[im 17/21  bone]
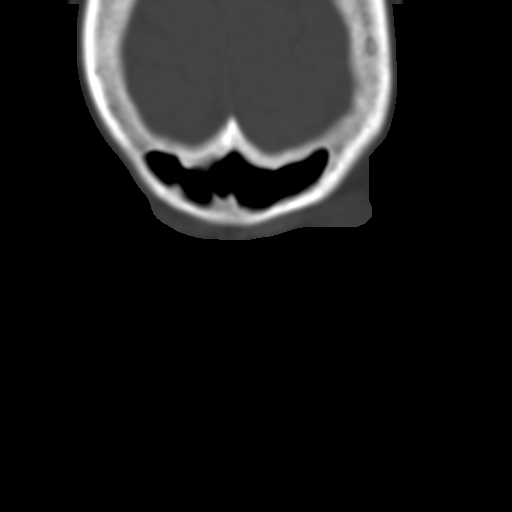
[im 19/21  bone]
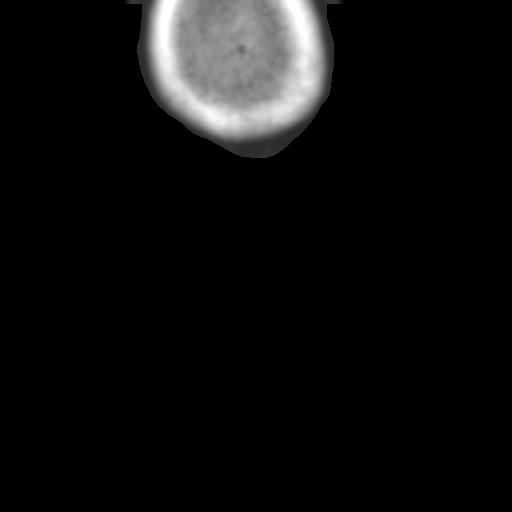
[im 20/21  bone]
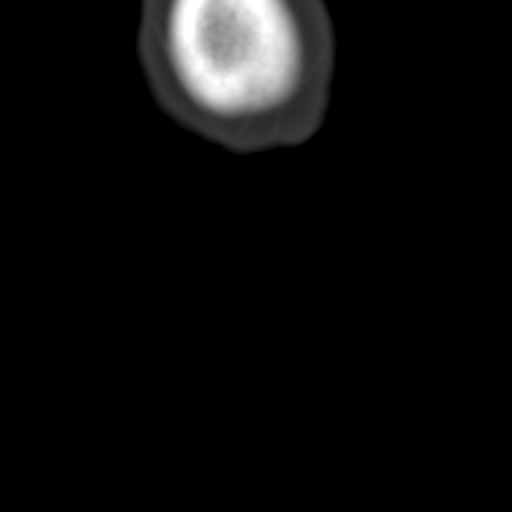

[15 of 21 positions shown; findings below may reference images not displayed]

FINDINGS: Status post ethmoidectomies, partial turbinectomies, and medial
maxillary antrostomies. On this limited scan, not all of the
surgical openings are visible (especially the right maxillary
sinus). The visible surgical openings are patent. There is mucosal
thickening throughout the paranasal sinuses, with fluid levels in
the bilateral maxillary and sphenoid sinuses. Stable sclerotic
thickening of the sphenoid sinus walls consistent with chronic
sinusitis. There is no evidence of bony destruction or periantral
inflammation. On 2 chest CTs spanning 2 years there is chronic
bronchitis and mucoid impaction. Sinus and bronchial findings raise
the possibility of a ciliary abnormality or immune dysfunction.
IMPRESSION: Diffuse acute on chronic sinusitis, as above.

## 2015-12-02 DIAGNOSIS — Z79899 Other long term (current) drug therapy: Secondary | ICD-10-CM | POA: Diagnosis not present

## 2015-12-02 DIAGNOSIS — M792 Neuralgia and neuritis, unspecified: Secondary | ICD-10-CM | POA: Diagnosis not present

## 2015-12-02 DIAGNOSIS — G47419 Narcolepsy without cataplexy: Secondary | ICD-10-CM | POA: Diagnosis not present

## 2015-12-02 DIAGNOSIS — E119 Type 2 diabetes mellitus without complications: Secondary | ICD-10-CM | POA: Diagnosis not present

## 2015-12-02 DIAGNOSIS — G4733 Obstructive sleep apnea (adult) (pediatric): Secondary | ICD-10-CM | POA: Diagnosis not present

## 2015-12-11 DIAGNOSIS — M79675 Pain in left toe(s): Secondary | ICD-10-CM | POA: Diagnosis not present

## 2015-12-11 DIAGNOSIS — L851 Acquired keratosis [keratoderma] palmaris et plantaris: Secondary | ICD-10-CM | POA: Diagnosis not present

## 2015-12-11 DIAGNOSIS — M205X9 Other deformities of toe(s) (acquired), unspecified foot: Secondary | ICD-10-CM | POA: Diagnosis not present

## 2015-12-11 DIAGNOSIS — L609 Nail disorder, unspecified: Secondary | ICD-10-CM | POA: Diagnosis not present

## 2015-12-14 ENCOUNTER — Other Ambulatory Visit (HOSPITAL_COMMUNITY): Payer: Self-pay | Admitting: Hematology & Oncology

## 2015-12-15 ENCOUNTER — Encounter (HOSPITAL_COMMUNITY): Payer: Medicare Other

## 2015-12-15 ENCOUNTER — Encounter (HOSPITAL_COMMUNITY): Payer: Medicare Other | Attending: Hematology & Oncology

## 2015-12-15 ENCOUNTER — Encounter (HOSPITAL_COMMUNITY): Payer: Self-pay

## 2015-12-15 VITALS — BP 172/85 | HR 59 | Temp 98.0°F | Resp 16 | Wt 159.4 lb

## 2015-12-15 DIAGNOSIS — D801 Nonfamilial hypogammaglobulinemia: Secondary | ICD-10-CM | POA: Diagnosis not present

## 2015-12-15 DIAGNOSIS — D508 Other iron deficiency anemias: Secondary | ICD-10-CM | POA: Insufficient documentation

## 2015-12-15 DIAGNOSIS — C8338 Diffuse large B-cell lymphoma, lymph nodes of multiple sites: Secondary | ICD-10-CM

## 2015-12-15 LAB — CBC WITH DIFFERENTIAL/PLATELET
BASOS ABS: 0 10*3/uL (ref 0.0–0.1)
Basophils Relative: 0 %
EOS PCT: 1 %
Eosinophils Absolute: 0.1 10*3/uL (ref 0.0–0.7)
HCT: 38.5 % (ref 36.0–46.0)
HEMOGLOBIN: 12.2 g/dL (ref 12.0–15.0)
LYMPHS ABS: 0.6 10*3/uL — AB (ref 0.7–4.0)
LYMPHS PCT: 7 %
MCH: 31.3 pg (ref 26.0–34.0)
MCHC: 31.7 g/dL (ref 30.0–36.0)
MCV: 98.7 fL (ref 78.0–100.0)
Monocytes Absolute: 0.4 10*3/uL (ref 0.1–1.0)
Monocytes Relative: 4 %
NEUTROS PCT: 88 %
Neutro Abs: 8.6 10*3/uL — ABNORMAL HIGH (ref 1.7–7.7)
PLATELETS: 179 10*3/uL (ref 150–400)
RBC: 3.9 MIL/uL (ref 3.87–5.11)
RDW: 13.7 % (ref 11.5–15.5)
WBC: 9.8 10*3/uL (ref 4.0–10.5)

## 2015-12-15 LAB — COMPREHENSIVE METABOLIC PANEL
ALK PHOS: 86 U/L (ref 38–126)
ALT: 12 U/L — AB (ref 14–54)
AST: 21 U/L (ref 15–41)
Albumin: 3.6 g/dL (ref 3.5–5.0)
Anion gap: 9 (ref 5–15)
BUN: 10 mg/dL (ref 6–20)
CALCIUM: 8.9 mg/dL (ref 8.9–10.3)
CHLORIDE: 101 mmol/L (ref 101–111)
CO2: 29 mmol/L (ref 22–32)
CREATININE: 0.9 mg/dL (ref 0.44–1.00)
GFR calc Af Amer: >60 mL/min (ref >60)
Glucose, Bld: 112 mg/dL — ABNORMAL HIGH (ref 65–99)
Potassium: 3.6 mmol/L (ref 3.5–5.1)
SODIUM: 139 mmol/L (ref 135–145)
Total Bilirubin: 0.4 mg/dL (ref 0.3–1.2)
Total Protein: 7 g/dL (ref 6.5–8.1)

## 2015-12-15 LAB — FERRITIN: Ferritin: 44 ng/mL (ref 11–307)

## 2015-12-15 MED ORDER — ACETAMINOPHEN 325 MG PO TABS
ORAL_TABLET | ORAL | Status: AC
  Filled 2015-12-15: qty 2

## 2015-12-15 MED ORDER — IMMUNE GLOBULIN (HUMAN) 10 GM/100ML IV SOLN
50.0000 g | Freq: Once | INTRAVENOUS | Status: AC
  Administered 2015-12-15: 50 g via INTRAVENOUS
  Filled 2015-12-15: qty 100

## 2015-12-15 MED ORDER — DIPHENHYDRAMINE HCL 25 MG PO CAPS
ORAL_CAPSULE | ORAL | Status: AC
  Filled 2015-12-15: qty 1

## 2015-12-15 MED ORDER — DIPHENHYDRAMINE HCL 25 MG PO CAPS
25.0000 mg | ORAL_CAPSULE | Freq: Once | ORAL | Status: DC
  Filled 2015-12-15: qty 1

## 2015-12-15 MED ORDER — SODIUM CHLORIDE 0.9 % IV SOLN: Freq: Once | INTRAVENOUS | Status: DC

## 2015-12-15 MED ORDER — DEXTROSE 5 % IV SOLN
INTRAVENOUS | Status: DC
  Administered 2015-12-15: 11:00:00 via INTRAVENOUS

## 2015-12-15 MED ORDER — SODIUM CHLORIDE 0.9 % IJ SOLN: 10.0000 mL | INTRAMUSCULAR | Status: DC | PRN

## 2015-12-15 MED ORDER — ACETAMINOPHEN 325 MG PO TABS: 650.0000 mg | ORAL_TABLET | Freq: Four times a day (QID) | ORAL | Status: DC | PRN

## 2015-12-15 NOTE — Progress Notes (Signed)
Patient tolerated infusion well.  VSS, BP slightly elevated, but this is normal with each IVIG infusion.  Patient asymptomatic.  Patient ambulatory and stable upon discharge from clinic.

## 2015-12-15 NOTE — Patient Instructions (Signed)
[place] at [facility] Discharge Instructions  RECOMMENDATIONS MADE BY THE CONSULTANT AND ANY TEST RESULTS WILL BE SENT TO YOUR REFERRING PHYSICIAN.  IVIG today.    Thank you for choosing [place] at [facility] to provide your oncology and hematology care.  To afford each patient quality time with our provider, please arrive at least 15 minutes before your scheduled appointment time.   Beginning January 23rd 2017 lab work for the [facility] will be done in the  Main lab at [facility] on 1st floor. If you have a lab appointment with the [facility] please come in thru the  Main Entrance and check in at the main information desk  You need to re-schedule your appointment should you arrive 10 or more minutes late.  We strive to give you quality time with our providers, and arriving late affects you and other patients whose appointments are after yours.  Also, if you no show three or more times for appointments you may be dismissed from the clinic at the providers discretion.     Again, thank you for choosing [facility].  Our hope is that these requests will decrease the amount of time that you wait before being seen by our physicians.       _____________________________________________________________  Should you have questions after your visit to [facility], please contact our office at [phone] between the hours of 8:30 a.m. and 4:30 p.m.  Voicemails left after 4:30 p.m. will not be returned until the following business day.  For prescription refill requests, have your pharmacy contact our office.         Resources For Cancer Patients and their Caregivers ? American Cancer Society: Can assist with transportation, wigs, general needs, runs Look Good Feel Better.        ([phone] ? Cancer Care: Provides financial assistance, online support groups, medication/co-pay assistance.  1-800-813-HOPE  401-444-9513) ? [place] Assists [place] cancer patients and their families through emotional , educational and financial support.  [phone] ? [place] DSS Where to apply for food stamps, Medicaid and utility assistance. 719-276-0358 ? RCATS: Transportation to medical appointments. 678-158-8347 ? Social Security Administration: May apply for disability if have a Stage IV cancer. 870-749-8122 [phone] ? [employer], Disability and Transit Services: Assists with nutrition, care and transit needs. [place] Support Programs: @10RELATIVEDAYS @ > Cancer Support Group  2nd Tuesday of the month 1pm-2pm, Journey Room  > Creative Journey  3rd Tuesday of the month 1130am-1pm, Journey Room  > Look Good Feel Better  1st Wednesday of the month 10am-12 noon, Journey Room (Call [place] to register [phone])

## 2015-12-16 DIAGNOSIS — E663 Overweight: Secondary | ICD-10-CM | POA: Diagnosis not present

## 2015-12-16 DIAGNOSIS — Z1389 Encounter for screening for other disorder: Secondary | ICD-10-CM | POA: Diagnosis not present

## 2015-12-16 DIAGNOSIS — N342 Other urethritis: Secondary | ICD-10-CM | POA: Diagnosis not present

## 2015-12-16 DIAGNOSIS — R350 Frequency of micturition: Secondary | ICD-10-CM | POA: Diagnosis not present

## 2015-12-16 DIAGNOSIS — Z6827 Body mass index (BMI) 27.0-27.9, adult: Secondary | ICD-10-CM | POA: Diagnosis not present

## 2015-12-16 LAB — IGG, IGA, IGM
IgA: 15 mg/dL — ABNORMAL LOW (ref 87–352)
IgG (Immunoglobin G), Serum: 965 mg/dL (ref 700–1600)
IgM, Serum: 86 mg/dL (ref 26–217)

## 2015-12-18 ENCOUNTER — Other Ambulatory Visit: Payer: Self-pay

## 2015-12-18 MED ORDER — TIOTROPIUM BROMIDE MONOHYDRATE 18 MCG IN CAPS
18.0000 ug | ORAL_CAPSULE | Freq: Every day | RESPIRATORY_TRACT | 3 refills | Status: DC
Start: 2015-12-18 — End: 2018-01-09

## 2015-12-27 IMAGING — CR DG CHEST 2V
2 series · 2 of 2 positions shown · non-contrast
Comparison: CT 01/16/2014. PET-CT 06/24/2013. Chest x-ray
06/14/2014.

CLINICAL DATA: Cough and congestion.

EXAM:
CHEST  2 VIEW

[view not recorded (1 of 2)]
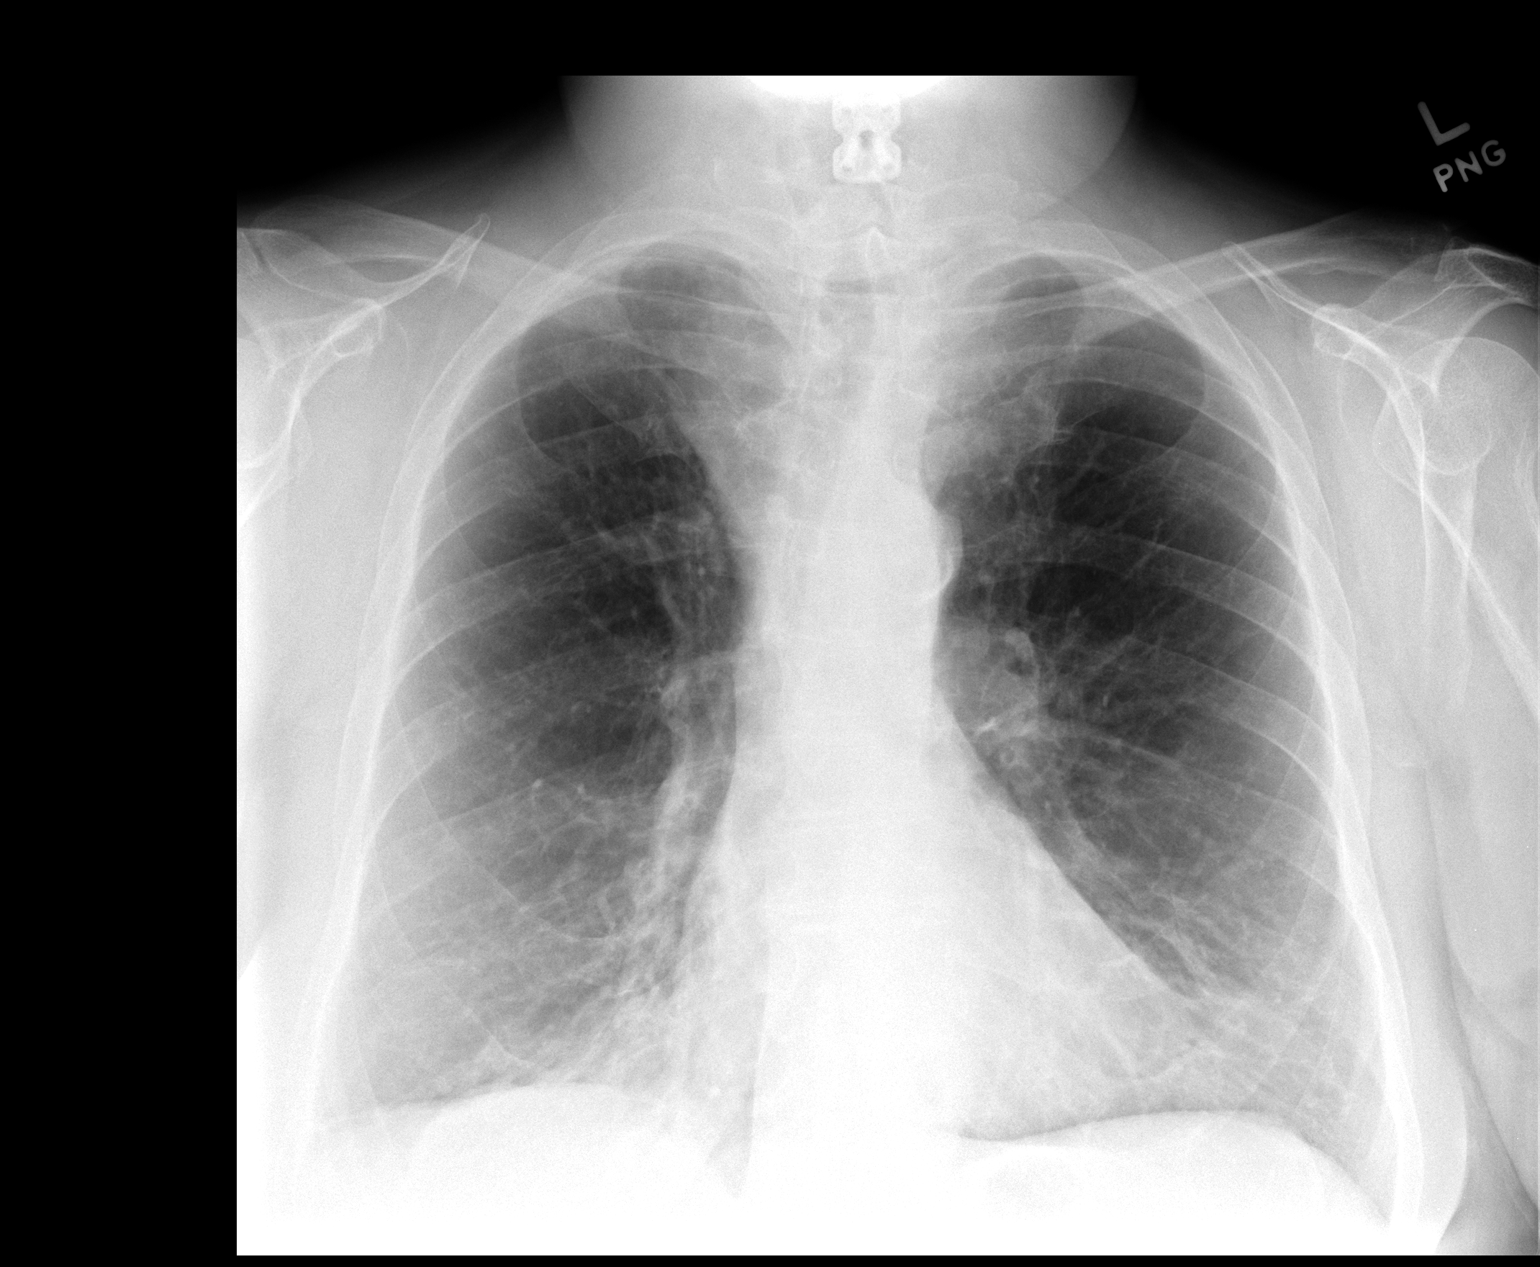

[view not recorded (2 of 2)]
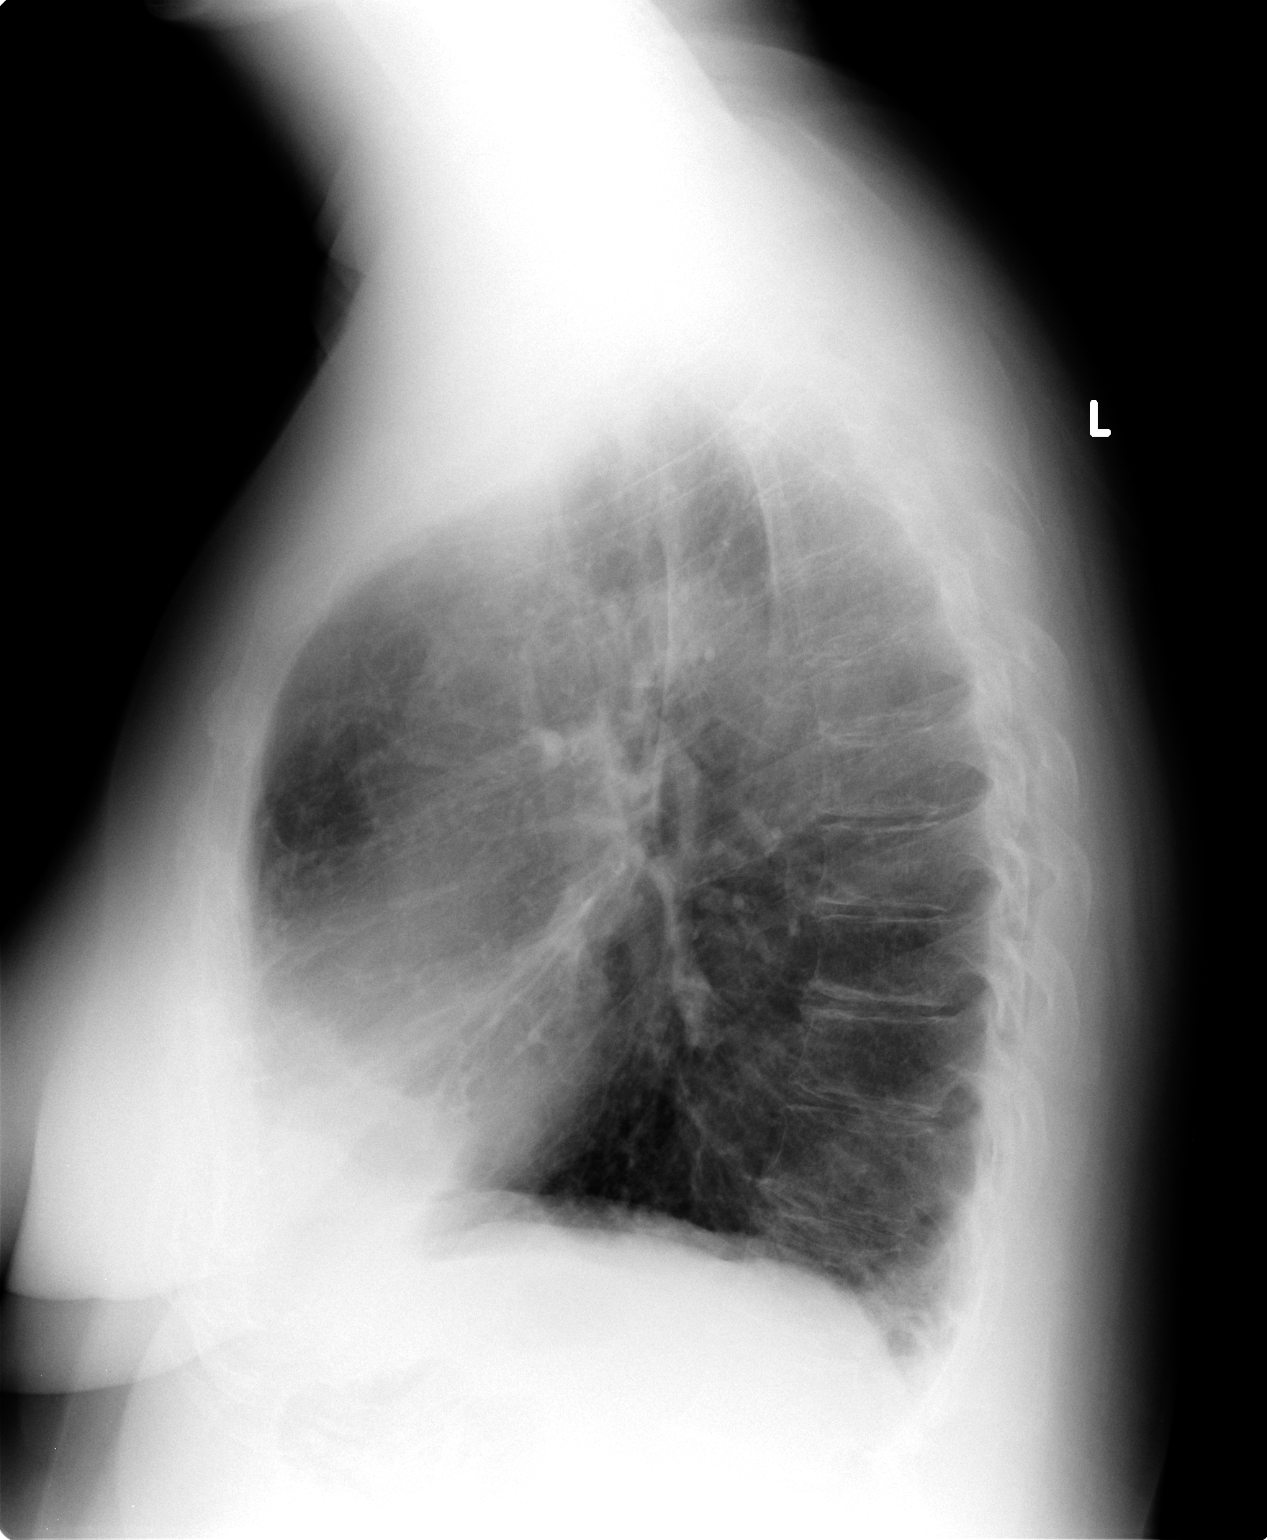

[2 of 2 positions shown; findings below may reference images not displayed]

FINDINGS: Mediastinum and hilar structures stable. Mild mediastinal prominence
consistent prominent great vessels. Heart size normal. Persistent
mild right base pulmonary infiltrate noted. No pleural effusion or
pneumothorax. Prior cervical spine fusion.
IMPRESSION: Persistent mild right base pulmonary infiltrate . Active pneumonia
cannot be excluded. Chronic atypical infection cannot be excluded.

## 2016-01-05 DIAGNOSIS — H524 Presbyopia: Secondary | ICD-10-CM | POA: Diagnosis not present

## 2016-01-09 IMAGING — CR DG CHEST 2V
2 series · 2 of 2 positions shown · non-contrast
Comparison: 05/06/2014, CT 01/16/2014, chest x-ray 06/13/2013,
PET-CT 06/22/2013, additional CTs of 0916.

CLINICAL DATA: 59-year-old female with a history of possible prior
infection

EXAM:
CHEST - 2 VIEW

[view not recorded (1 of 2)]
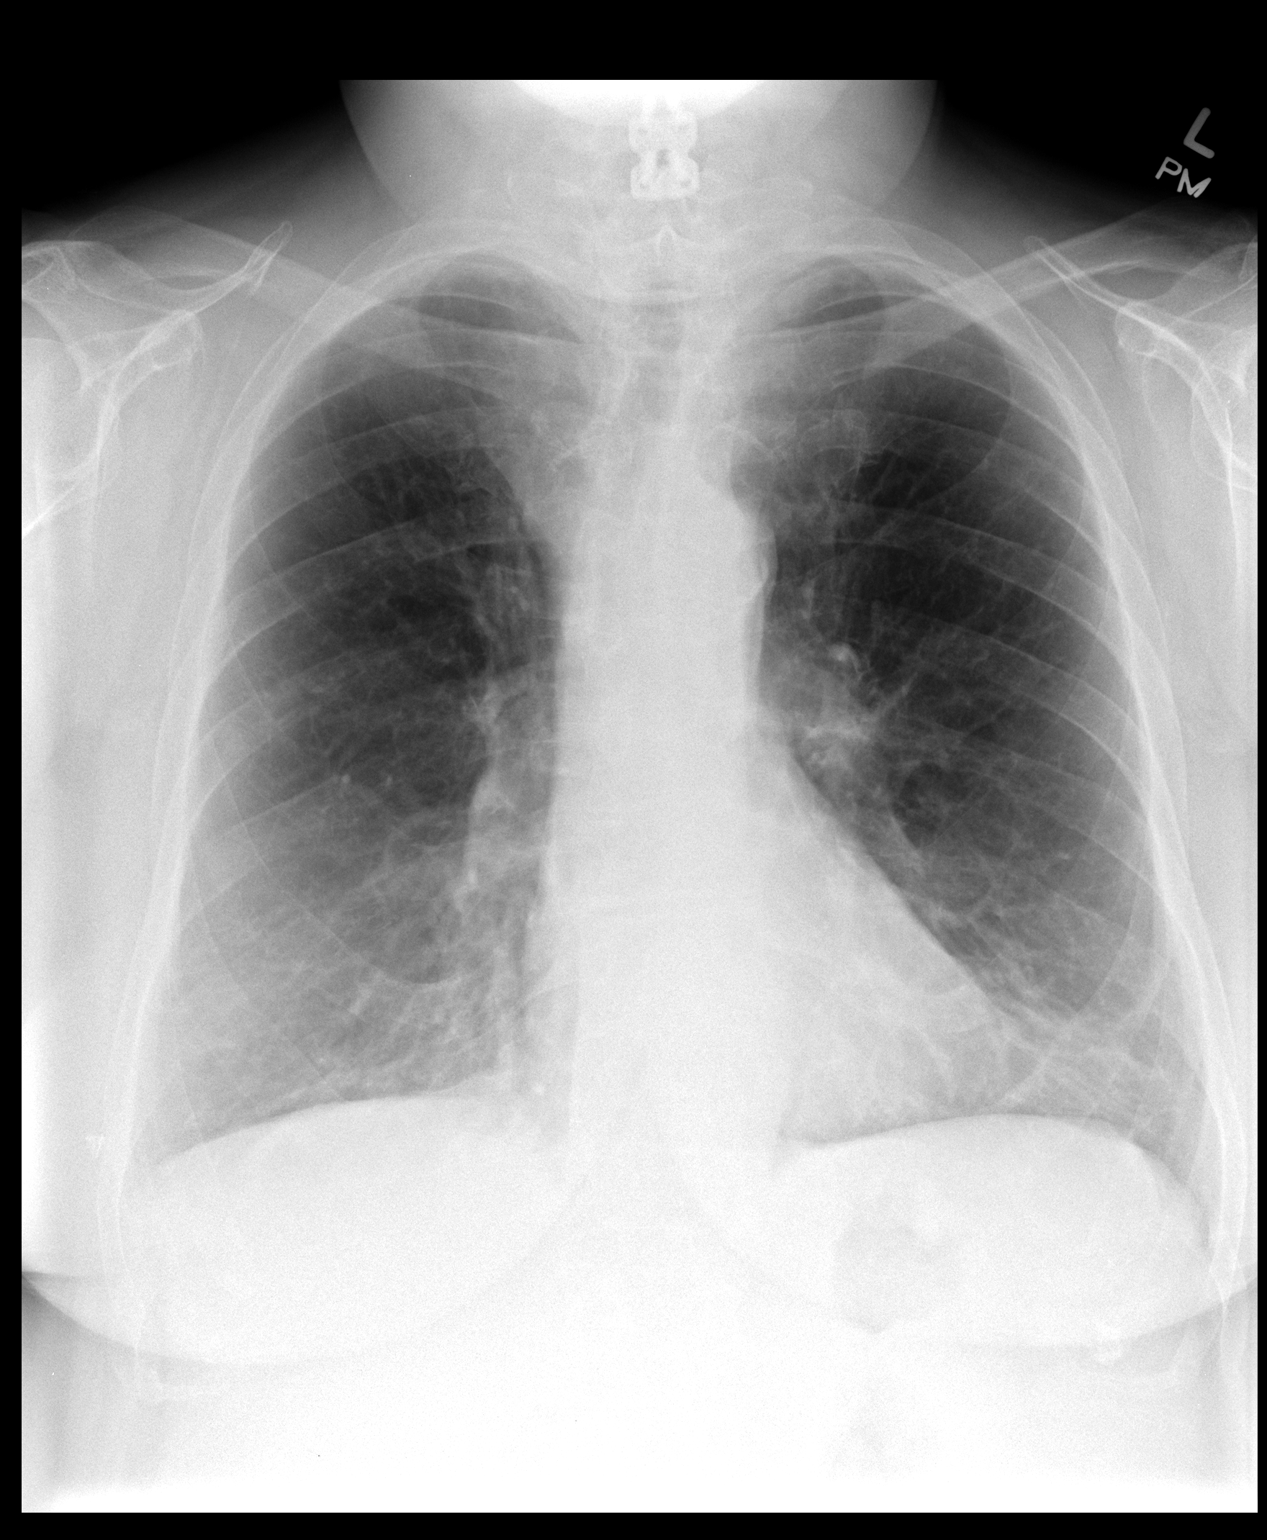

[view not recorded (2 of 2)]
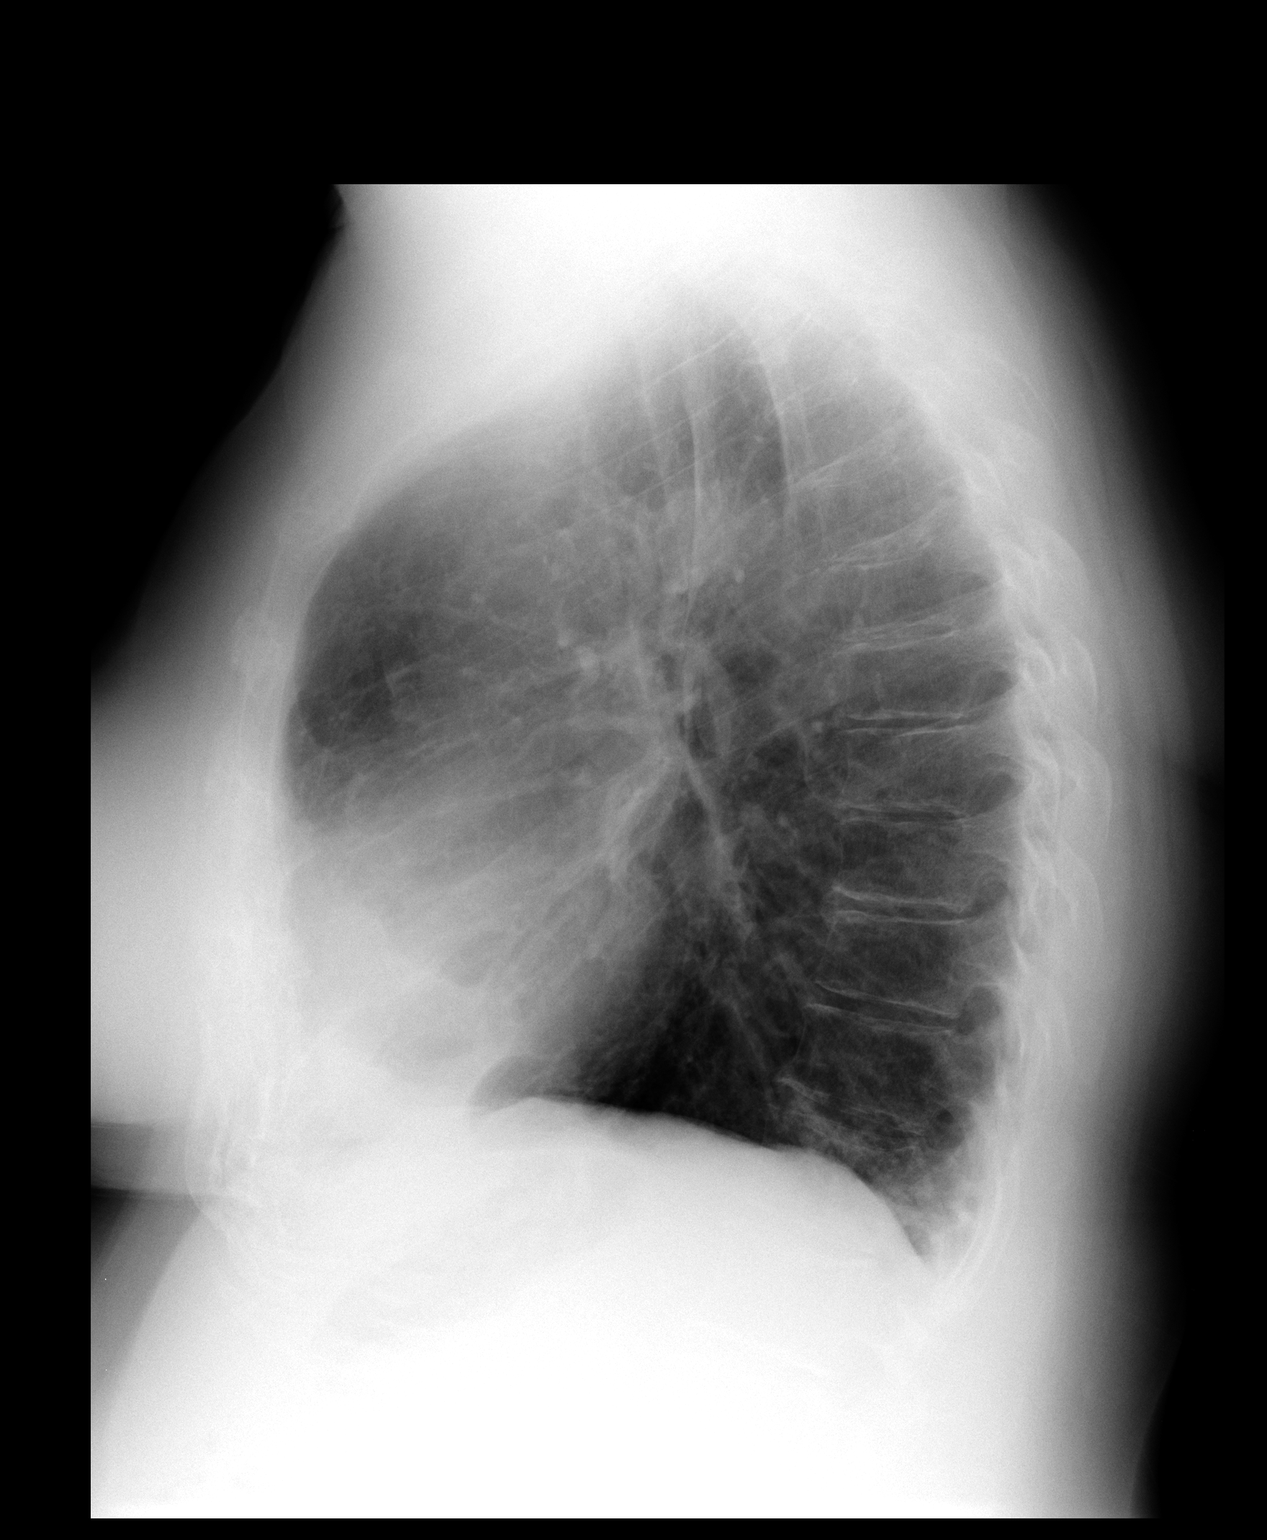

[2 of 2 positions shown; findings below may reference images not displayed]

FINDINGS: Cardiomediastinal silhouette is unchanged. Calcifications of the
aortic arch.

No evidence of pulmonary vascular congestion. Evidence of hilar
lymph nodes, similar to comparison studies.

No confluent airspace disease.

Interstitial opacities the bilateral lung bases with similar pattern
of peribronchial thickening.

Overall the appearance is similar to prior chest x-rays no evidence
of new or enlarging pulmonary nodules.

No pleural effusion or pneumothorax.

Stigmata of emphysema, with increased retrosternal airspace,
flattened hemidiaphragms, increased AP diameter, and hyperinflation
on the AP view.

No displaced fracture.

Unremarkable appearance of the upper abdomen.
IMPRESSION: No evidence of lobar pneumonia or pleural effusion.

Persisting pattern of interstitial opacities and peribronchial
thickening in this patient with several prior CT studies showing
bronchial wall thickening, bronchiectasis. Differential diagnosis
includes chronic indolent infection such as LUBANA, hypersensitivity
pneumonitis, atypical infection and/or bronchitis, with fungal
atypical infections also considered such as allergic
bronchopulmonary aspergillosis (given the history of lymphoma/
decreased immunity.)

## 2016-01-18 ENCOUNTER — Encounter (HOSPITAL_COMMUNITY): Payer: Medicare Other | Attending: Hematology & Oncology

## 2016-01-18 ENCOUNTER — Encounter (HOSPITAL_COMMUNITY): Payer: Medicare Other

## 2016-01-18 ENCOUNTER — Encounter (HOSPITAL_COMMUNITY): Payer: Self-pay

## 2016-01-18 VITALS — BP 185/81 | HR 57 | Temp 98.0°F | Resp 18

## 2016-01-18 DIAGNOSIS — C8338 Diffuse large B-cell lymphoma, lymph nodes of multiple sites: Secondary | ICD-10-CM

## 2016-01-18 DIAGNOSIS — D801 Nonfamilial hypogammaglobulinemia: Secondary | ICD-10-CM | POA: Diagnosis not present

## 2016-01-18 DIAGNOSIS — D508 Other iron deficiency anemias: Secondary | ICD-10-CM | POA: Insufficient documentation

## 2016-01-18 LAB — COMPREHENSIVE METABOLIC PANEL
ALBUMIN: 3.7 g/dL (ref 3.5–5.0)
ALK PHOS: 89 U/L (ref 38–126)
ALT: 15 U/L (ref 14–54)
AST: 21 U/L (ref 15–41)
Anion gap: 8 (ref 5–15)
BILIRUBIN TOTAL: 0.4 mg/dL (ref 0.3–1.2)
BUN: 10 mg/dL (ref 6–20)
CALCIUM: 9.1 mg/dL (ref 8.9–10.3)
CO2: 30 mmol/L (ref 22–32)
CREATININE: 1.02 mg/dL — AB (ref 0.44–1.00)
Chloride: 99 mmol/L — ABNORMAL LOW (ref 101–111)
GFR calc Af Amer: >60 mL/min (ref >60)
GFR calc non Af Amer: 59 mL/min — ABNORMAL LOW (ref >60)
GLUCOSE: 120 mg/dL — AB (ref 65–99)
Potassium: 3.7 mmol/L (ref 3.5–5.1)
SODIUM: 137 mmol/L (ref 135–145)
Total Protein: 7.1 g/dL (ref 6.5–8.1)

## 2016-01-18 LAB — CBC WITH DIFFERENTIAL/PLATELET
Basophils Absolute: 0 10*3/uL (ref 0.0–0.1)
Basophils Relative: 0 %
EOS ABS: 0.1 10*3/uL (ref 0.0–0.7)
Eosinophils Relative: 1 %
HEMATOCRIT: 41 % (ref 36.0–46.0)
HEMOGLOBIN: 13.2 g/dL (ref 12.0–15.0)
LYMPHS ABS: 0.9 10*3/uL (ref 0.7–4.0)
Lymphocytes Relative: 8 %
MCH: 30.8 pg (ref 26.0–34.0)
MCHC: 32.2 g/dL (ref 30.0–36.0)
MCV: 95.6 fL (ref 78.0–100.0)
Monocytes Absolute: 0.4 10*3/uL (ref 0.1–1.0)
Monocytes Relative: 4 %
NEUTROS ABS: 9.7 10*3/uL — AB (ref 1.7–7.7)
NEUTROS PCT: 87 %
Platelets: 193 10*3/uL (ref 150–400)
RBC: 4.29 MIL/uL (ref 3.87–5.11)
RDW: 13.3 % (ref 11.5–15.5)
WBC: 11.1 10*3/uL — AB (ref 4.0–10.5)

## 2016-01-18 MED ORDER — DIPHENHYDRAMINE HCL 25 MG PO TABS
25.0000 mg | ORAL_TABLET | Freq: Once | ORAL | Status: DC
  Filled 2016-01-18: qty 1

## 2016-01-18 MED ORDER — DEXTROSE 5 % IV SOLN
INTRAVENOUS | Status: DC
  Administered 2016-01-18: 10:00:00 via INTRAVENOUS

## 2016-01-18 MED ORDER — IMMUNE GLOBULIN (HUMAN) 10 GM/100ML IV SOLN
50.0000 g | Freq: Once | INTRAVENOUS | Status: AC
  Administered 2016-01-18: 50 g via INTRAVENOUS
  Filled 2016-01-18: qty 100

## 2016-01-18 MED ORDER — ACETAMINOPHEN 325 MG PO TABS: 650.0000 mg | ORAL_TABLET | Freq: Four times a day (QID) | ORAL | Status: DC | PRN

## 2016-01-18 NOTE — Progress Notes (Signed)
Tolerated IVIG infusion w/o adverse reaction.  Alert, in no distress.  VSS.  Pt states she will f/u with PCP re: elevated BP, as this has been an ongoing issue. Discharged ambulatory.

## 2016-01-18 NOTE — Patient Instructions (Signed)
[place] at [facility] Discharge Instructions  RECOMMENDATIONS MADE BY THE CONSULTANT AND ANY TEST RESULTS WILL BE SENT TO YOUR REFERRING PHYSICIAN.  IVIG infusion today. Return as scheduled for labs and infusion. Return as scheduled for office visit.   Thank you for choosing [place] at [facility] to provide your oncology and hematology care.  To afford each patient quality time with our provider, please arrive at least 15 minutes before your scheduled appointment time.    If you have a lab appointment with the [place] please come in thru the  Main Entrance and check in at the main information desk  You need to re-schedule your appointment should you arrive 10 or more minutes late.  We strive to give you quality time with our providers, and arriving late affects you and other patients whose appointments are after yours.  Also, if you no show three or more times for appointments you may be dismissed from the clinic at the providers discretion.     Again, thank you for choosing [facility].  Our hope is that these requests will decrease the amount of time that you wait before being seen by our physicians.       _____________________________________________________________  Should you have questions after your visit to [facility], please contact our office at [phone] between the hours of 8:30 a.m. and 4:30 p.m.  Voicemails left after 4:30 p.m. will not be returned until the following business day.  For prescription refill requests, have your pharmacy contact our office.       Resources For Cancer Patients and their Caregivers ? American Cancer Society: Can assist with transportation, wigs, general needs, runs Look Good Feel Better.        [phone] ? Cancer Care: Provides financial assistance, online support groups, medication/co-pay assistance.  1-800-813-HOPE 229-257-3679) ? [place] Assists [place] cancer patients and their families through emotional , educational and financial support.  678-376-6399 ? [place] DSS Where to apply for food stamps, Medicaid and utility assistance. [phone] ? RCATS: Transportation to medical appointments. [phone] ? Social Security Administration: May apply for disability if have a Stage IV cancer. [phone] ? [employer], Disability and Transit Services: Assists with nutrition, care and transit needs. [place] Support Programs: @10RELATIVEDAYS @ > Cancer Support Group  2nd Tuesday of the month 1pm-2pm, Journey Room  > Creative Journey  3rd Tuesday of the month 1130am-1pm, Journey Room  > Look Good Feel Better  1st Wednesday of the month 10am-12 noon, Journey Room (Call [place] to register [phone])

## 2016-01-19 LAB — IGG, IGA, IGM
IGG (IMMUNOGLOBIN G), SERUM: 1074 mg/dL (ref 700–1600)
IgA: 15 mg/dL — ABNORMAL LOW (ref 87–352)
IgM, Serum: 99 mg/dL (ref 26–217)

## 2016-01-28 DIAGNOSIS — Z6827 Body mass index (BMI) 27.0-27.9, adult: Secondary | ICD-10-CM | POA: Diagnosis not present

## 2016-01-28 DIAGNOSIS — E663 Overweight: Secondary | ICD-10-CM | POA: Diagnosis not present

## 2016-01-28 DIAGNOSIS — I10 Essential (primary) hypertension: Secondary | ICD-10-CM | POA: Diagnosis not present

## 2016-02-10 DIAGNOSIS — L309 Dermatitis, unspecified: Secondary | ICD-10-CM | POA: Diagnosis not present

## 2016-02-10 DIAGNOSIS — Z85828 Personal history of other malignant neoplasm of skin: Secondary | ICD-10-CM | POA: Diagnosis not present

## 2016-02-10 DIAGNOSIS — D485 Neoplasm of uncertain behavior of skin: Secondary | ICD-10-CM | POA: Diagnosis not present

## 2016-02-10 DIAGNOSIS — D2262 Melanocytic nevi of left upper limb, including shoulder: Secondary | ICD-10-CM | POA: Diagnosis not present

## 2016-02-15 ENCOUNTER — Other Ambulatory Visit (HOSPITAL_COMMUNITY): Payer: Self-pay | Admitting: Hematology & Oncology

## 2016-02-17 ENCOUNTER — Encounter (HOSPITAL_COMMUNITY): Payer: Medicare Other

## 2016-02-17 ENCOUNTER — Encounter (HOSPITAL_COMMUNITY): Payer: Medicare Other | Attending: Oncology | Admitting: Oncology

## 2016-02-17 ENCOUNTER — Encounter (HOSPITAL_COMMUNITY): Payer: Self-pay

## 2016-02-17 ENCOUNTER — Encounter (HOSPITAL_BASED_OUTPATIENT_CLINIC_OR_DEPARTMENT_OTHER): Payer: Medicare Other

## 2016-02-17 VITALS — BP 130/84 | HR 69 | Temp 98.1°F | Resp 18 | Wt 153.2 lb

## 2016-02-17 DIAGNOSIS — D649 Anemia, unspecified: Secondary | ICD-10-CM | POA: Diagnosis not present

## 2016-02-17 DIAGNOSIS — C8338 Diffuse large B-cell lymphoma, lymph nodes of multiple sites: Secondary | ICD-10-CM | POA: Diagnosis not present

## 2016-02-17 DIAGNOSIS — Z8572 Personal history of non-Hodgkin lymphomas: Secondary | ICD-10-CM

## 2016-02-17 DIAGNOSIS — D801 Nonfamilial hypogammaglobulinemia: Secondary | ICD-10-CM | POA: Insufficient documentation

## 2016-02-17 DIAGNOSIS — J45909 Unspecified asthma, uncomplicated: Secondary | ICD-10-CM | POA: Diagnosis not present

## 2016-02-17 DIAGNOSIS — Z7901 Long term (current) use of anticoagulants: Secondary | ICD-10-CM

## 2016-02-17 LAB — CBC WITH DIFFERENTIAL/PLATELET
Basophils Absolute: 0 10*3/uL (ref 0.0–0.1)
Basophils Relative: 0 %
EOS ABS: 0.1 10*3/uL (ref 0.0–0.7)
EOS PCT: 0 %
HCT: 40.4 % (ref 36.0–46.0)
Hemoglobin: 13.3 g/dL (ref 12.0–15.0)
LYMPHS ABS: 0.8 10*3/uL (ref 0.7–4.0)
Lymphocytes Relative: 7 %
MCH: 31 pg (ref 26.0–34.0)
MCHC: 32.9 g/dL (ref 30.0–36.0)
MCV: 94.2 fL (ref 78.0–100.0)
MONOS PCT: 3 %
Monocytes Absolute: 0.3 10*3/uL (ref 0.1–1.0)
Neutro Abs: 10.6 10*3/uL — ABNORMAL HIGH (ref 1.7–7.7)
Neutrophils Relative %: 90 %
PLATELETS: 175 10*3/uL (ref 150–400)
RBC: 4.29 MIL/uL (ref 3.87–5.11)
RDW: 13.5 % (ref 11.5–15.5)
WBC: 11.8 10*3/uL — AB (ref 4.0–10.5)

## 2016-02-17 LAB — COMPREHENSIVE METABOLIC PANEL
ALBUMIN: 3.7 g/dL (ref 3.5–5.0)
ALT: 15 U/L (ref 14–54)
ANION GAP: 8 (ref 5–15)
AST: 21 U/L (ref 15–41)
Alkaline Phosphatase: 81 U/L (ref 38–126)
BUN: 12 mg/dL (ref 6–20)
CHLORIDE: 97 mmol/L — AB (ref 101–111)
CO2: 33 mmol/L — AB (ref 22–32)
Calcium: 9.3 mg/dL (ref 8.9–10.3)
Creatinine, Ser: 0.92 mg/dL (ref 0.44–1.00)
GFR calc non Af Amer: >60 mL/min (ref >60)
Glucose, Bld: 123 mg/dL — ABNORMAL HIGH (ref 65–99)
Potassium: 3.5 mmol/L (ref 3.5–5.1)
SODIUM: 138 mmol/L (ref 135–145)
Total Bilirubin: 0.3 mg/dL (ref 0.3–1.2)
Total Protein: 7.3 g/dL (ref 6.5–8.1)

## 2016-02-17 MED ORDER — ACETAMINOPHEN 325 MG PO TABS: 650.0000 mg | ORAL_TABLET | Freq: Four times a day (QID) | ORAL | Status: DC | PRN

## 2016-02-17 MED ORDER — DIPHENHYDRAMINE HCL 25 MG PO CAPS: 25.0000 mg | ORAL_CAPSULE | Freq: Once | ORAL | Status: DC

## 2016-02-17 MED ORDER — DEXTROSE 5 % IV SOLN
INTRAVENOUS | Status: DC
  Administered 2016-02-17: 10:00:00 via INTRAVENOUS

## 2016-02-17 MED ORDER — IMMUNE GLOBULIN (HUMAN) 10 GM/100ML IV SOLN
50.0000 g | Freq: Once | INTRAVENOUS | Status: AC
  Administered 2016-02-17: 50 g via INTRAVENOUS
  Filled 2016-02-17: qty 100

## 2016-02-17 NOTE — Progress Notes (Signed)
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 Treatment was stopped due to Pseudomonas sepsis after cycles 2 and 4 which was very severe  CURRENT THERAPY:50 g IV IgG monthly for IgG deficiency.  INTERVAL HISTORY: [name] 61 y.o. female returns for follow up of IgG deficiency.    She has been doing well with the IVIG, no side effects. She has some SOB from her asthma. Denies recent infections, chest pain, abdominal pain, nausea, vomiting, diarrhea, or any other concerns.   MEDICAL HISTORY: Past Medical History:  Diagnosis Date  . Allergic rhinitis   . Anemia   . Anxiety   . Asthma   . B12 deficiency 01/18/2015  . Cavitary lung disease   . Chronic kidney disease   . Chronic sinusitis   . Endotracheally intubated   . GERD (gastroesophageal reflux disease)   . Hypogammaglobulinemia, acquired ([place]) 12/30/2009   Qualifier: Diagnosis of  By: Joya Gaskins MD, Burnett Harry   . Iron deficiency anemia 01/18/2015  . Myocardial infarction 2011  . 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 Non Hodgkin's lymphoma ([place]) 2011  . Orofacial dyskinesia 04/22/2015  . Pneumonia 01/2013  . PONV (postoperative nausea and vomiting)   . Respiratory failure ([place])   . 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     is allergic to meperidine hcl and montelukast sodium.  Ms. Jerge does not currently have medications on file.  SURGICAL HISTORY: Past Surgical History:  Procedure Laterality Date  . ABDOMINAL HYSTERECTOMY    . BASAL CELL CARCINOMA EXCISION  03/2011   scalp  . BREAST SURGERY Right   . CATARACT EXTRACTION W/PHACO Left 11/17/2014   Procedure: CATARACT EXTRACTION PHACO AND INTRAOCULAR LENS PLACEMENT LEFT EYE;  Surgeon: Tonny Branch, MD;  Location: [facility] ORS;  Service: Ophthalmology;  Laterality: Left;  CDE:5.60  . CATARACT EXTRACTION W/PHACO Right 12/15/2014   Procedure: CATARACT EXTRACTION PHACO AND INTRAOCULAR LENS PLACEMENT RIGHT EYE CDE=5.16;  Surgeon: Tonny Branch, MD;  Location: [facility] ORS;  Service: Ophthalmology;  Laterality: Right;  . COLONOSCOPY N/A 11/14/2012   Procedure: COLONOSCOPY;  Surgeon: Rogene Houston, MD;  Location: [facility] ENDO SUITE;  Service: Endoscopy;  Laterality: N/A;  830  . NASAL SINUS SURGERY    . NECK SURGERY     x 2   . PERIPHERALLY INSERTED CENTRAL CATHETER INSERTION    . PICC Removal    . PORT-A-CATH REMOVAL    . PORTACATH PLACEMENT  7/11   Removed 6/12  . TRACHEOSTOMY    . VESICOVAGINAL FISTULA CLOSURE W/ TAH      Outpatient Encounter Prescriptions as of 11/12/2014  Medication Sig Note  . acetaminophen (TYLENOL) 500 MG tablet Take 1,000 mg by mouth every 6 (six) hours as needed for pain or fever.    [name] Kitchen acyclovir (ZOVIRAX) 400 MG tablet TAKE ONE TABLET BY MOUTH TWICE DAILY.   [name] Kitchen albuterol (PROVENTIL) (  2.5 MG/3ML) 0.083% nebulizer solution Take 2.5 mg by nebulization every 4 (four) hours as needed for wheezing or shortness of breath.   . Armodafinil (NUVIGIL) 150 MG tablet Take 150 mg by mouth daily.   . citalopram (CELEXA) 20 MG tablet Take 20 mg by mouth every morning.     . furosemide (LASIX) 20 MG tablet Take 20 mg by mouth as needed for fluid.   04/02/2014: Received from: External Pharmacy Received Sig:   . gabapentin (NEURONTIN) 300 MG capsule TAKE 1 CAPSULE BY MOUTH FOUR TIMES DAILY.   . mometasone-formoterol (DULERA) 200-5 MCG/ACT AERO Inhale 2 puffs into the lungs 2 (two) times daily.   [name] Kitchen omeprazole (PRILOSEC) 20 MG capsule Take 1 capsule (20 mg total) by mouth 2 (two) times daily before a meal.   . predniSONE (DELTASONE) 10 MG tablet Take 4 for four days 3 for four days 2 for four days then 1 daily and STAY (Patient taking differently: Take 5 mg by mouth daily. )   . simvastatin (ZOCOR) 80 MG tablet Take 80 mg by mouth at bedtime.     [name] Kitchen tiotropium (SPIRIVA) 18 MCG inhalation capsule Place 1 capsule (18 mcg total) into inhaler and inhale daily.   . VENTOLIN HFA 108 (90 BASE) MCG/ACT inhaler INHALE 2 PUFFS INTO THE LUNGS EVERY 6 HOURS AS NEEDED.   [name] Kitchen vitamin C (ASCORBIC ACID) 500 MG tablet Take 500 mg by mouth 2 (two) times daily.   [name] Kitchen warfarin (COUMADIN) 10 MG tablet Take 5 mg by mouth every evening.    . zafirlukast (ACCOLATE) 20 MG tablet TAKE 1 TABLET BY MOUTH TWICE DAILY.   . fluticasone (FLONASE) 50 MCG/ACT nasal spray Place 2 sprays into the nose 2 (two) times daily. (Patient not taking: Reported on 11/12/2014)   . ketorolac (ACULAR) 0.5 % ophthalmic solution  11/12/2014: Received from: External Pharmacy  . levofloxacin (LEVAQUIN) 250 MG tablet Take 2 tablets on first day then one tablet daily therafter for a total of 14 days   . ofloxacin (OCUFLOX) 0.3 % ophthalmic solution  11/12/2014: Received from: External Pharmacy  . prednisoLONE acetate (PRED FORTE) 1 % ophthalmic suspension  11/12/2014: Received from: External Pharmacy   No facility-administered encounter medications on file as of 11/12/2014.    SOCIAL HISTORY: Social History   Social History  . Marital status: Divorced    Spouse name: N/A  . Number of children: 3  . Years of education: N/A   Occupational History  . [employer]    Social History Main Topics  . Smoking status: Never Smoker  . Smokeless tobacco: Never Used  . Alcohol use No  . Drug use: No  . Sexual activity: No   Other Topics Concern  . Not on file   Social History Narrative   Originally from [place]. Always lived in [place]. Prior travel to [place]. 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 She has 1 dog currently. She has 2 conures (small parrots). No mold exposure in her home. 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 No hot tub exposure. She enjoys sewing & quilting.     FAMILY HISTORY: Family History  Problem Relation Age of Onset  . Emphysema Mother   . Stroke Mother   . Allergies Father   . Asthma Father     as a child  . Leukemia Maternal Grandmother   . Diabetes Brother  Review of Systems  Constitutional: Negative.        No recent infections  HENT: Negative.   Eyes: Negative.   Respiratory: Positive for shortness of breath (from asthma).   Cardiovascular: Negative.  Negative for chest pain.  Gastrointestinal: Negative.  Negative for abdominal pain, diarrhea, nausea and vomiting.  Genitourinary: Negative.   Musculoskeletal: Negative.   Skin: Negative.   Neurological: Negative.   Endo/Heme/Allergies: Negative.   Psychiatric/Behavioral: Negative.   All other systems reviewed and are negative.   14 point review of systems was performed and is negative except as detailed under history of present illness and above  PHYSICAL EXAMINATION  ECOG PERFORMANCE STATUS: 0 - Asymptomatic  Vitals with BMI 02/17/2016  Height   Weight 153 lbs 3 oz  BMI   Systolic [id number]  Diastolic 82  Pulse 69  Respirations    Physical Exam  Constitutional: She is oriented to person, place, and time and well-developed, well-nourished, and in no distress.  HENT:  Head: Normocephalic and atraumatic.  Eyes: EOM are normal. Pupils are equal, round, and reactive to light.  Neck: Normal  range of motion. Neck supple.  Cardiovascular: Normal rate, regular rhythm and normal heart sounds.   Pulmonary/Chest: Effort normal. She has wheezes.  Abdominal: Soft. Bowel sounds are normal.  Musculoskeletal: Normal range of motion.  Neurological: She is alert and oriented to person, place, and time. Gait normal.  Skin: Skin is warm and dry.  Nursing note and vitals reviewed.  LABORATORY DATA: I have reviewed the data as listed.  CBC    Component Value Date/Time   WBC 11.1 (H) 01/18/2016 0855   RBC 4.29 01/18/2016 0855   HGB 13.2 01/18/2016 0855   HCT 41.0 01/18/2016 0855   PLT 193 01/18/2016 0855   MCV 95.6 01/18/2016 0855   MCH 30.8 01/18/2016 0855   MCHC 32.2 01/18/2016 0855   RDW 13.3 01/18/2016 0855   LYMPHSABS 0.9 01/18/2016 0855   MONOABS 0.4 01/18/2016 0855   EOSABS 0.1 01/18/2016 0855   BASOSABS 0.0 01/18/2016 0855   CMP     Component Value Date/Time   NA 137 01/18/2016 0855   K 3.7 01/18/2016 0855   CL 99 (L) 01/18/2016 0855   CO2 30 01/18/2016 0855   GLUCOSE 120 (H) 01/18/2016 0855   BUN 10 01/18/2016 0855   CREATININE 1.02 (H) 01/18/2016 0855   CALCIUM 9.1 01/18/2016 0855   PROT 7.1 01/18/2016 0855   ALBUMIN 3.7 01/18/2016 0855   AST 21 01/18/2016 0855   ALT 15 01/18/2016 0855   ALKPHOS 89 01/18/2016 0855   BILITOT 0.4 01/18/2016 0855   GFRNONAA 59 (L) 01/18/2016 0855   GFRAA >60 01/18/2016 0855     RADIOLOGY:  Mammogram Screening Bilateral 02/26/2015 IMPRESSION: No mammographic evidence of malignancy. A result letter of this screening mammogram will be mailed directly to the patient.  ASSESSMENT and THERAPY PLAN:   Hypogammaglobulinemia Asthma  She will continue with IVIG.  I will see her back in 3 months. She continues to follow with pulmonary. For her symptoms over the past week I have refilled her prior antibiotic. If her symptoms worsen she is encouraged to follow-up with her pulmonologist.  Immunoglobulin levels added today.    Diffuse large B-cell lymphoma  She remains without evidence of obvious recurrence. We will continue ongoing observation. I advised her that her disease was in 2011 and at this point she is unlikely to have a recurrence.   NCCN guidelines recommends the follow surveillance  for Stage I, II for those who asertain a complete response in the first-line treatment setting (3.2017):  A. H&P every 3-6 months for 5 years, then yearly or as clinically indicated.  B. Labs every 3-6 months for 5 years and then annually or as clinically indicated.  C. Imaging only as indicated. For those ascertaining a complete response in the Stage III, IV setting in first-line treatment setting:  A. H&P every 3-6 months for 5 years, then yearly or as clinically indicated.  B. Labs every 3-6 months for 5 years and then annually or as clinically indicated.  C. CT C/A/P with contrast no more than every 6 months for 2 years after  completion of treatment; then only as indicated.   Anemia with h/o iron deficiency- improved Colonoscopy in 2014 with multiple polyps Ongoing coumadin use  Clinically doing well. No evidence of recurrence of her DLBCL. Continue IVIG for IgG deficiency. Repeat labs with CBC, CMP, LDH, immunoglobulins at next visit.   She will return for follow up in 3 months.   All questions were answered. The patient knows to call the clinic with any problems, questions or concerns. We can certainly see the patient much sooner if necessary.   This document serves as a record of services personally performed by Twana First, MD. It was created on her behalf by Martinique Casey, a trained medical scribe. The creation of this record is based on the scribe's personal observations and the provider's statements to them. This document has been checked and approved by the attending provider.  I have reviewed the above documentation for accuracy and completeness, and I agree with the above.  This note was signed  electronically.

## 2016-02-17 NOTE — Patient Instructions (Addendum)
[place] at [facility] Discharge Instructions  RECOMMENDATIONS MADE BY THE CONSULTANT AND ANY TEST RESULTS WILL BE SENT TO YOUR REFERRING PHYSICIAN.  Exam with Dr. Talbert Cage today. Return to the clinic in three months for follow up as well as labs.    Please see Amy as you leave for appointments.    Thank you for choosing [place] at [facility] to provide your oncology and hematology care.  To afford each patient quality time with our provider, please arrive at least 15 minutes before your scheduled appointment time.    If you have a lab appointment with the [place] please come in thru the  Main Entrance and check in at the main information desk  You need to re-schedule your appointment should you arrive 10 or more minutes late.  We strive to give you quality time with our providers, and arriving late affects you and other patients whose appointments are after yours.  Also, if you no show three or more times for appointments you may be dismissed from the clinic at the providers discretion.     Again, thank you for choosing [facility].  Our hope is that these requests will decrease the amount of time that you wait before being seen by our physicians.       _____________________________________________________________  Should you have questions after your visit to [facility], please contact our office at [phone] between the hours of 8:30 a.m. and 4:30 p.m.  Voicemails left after 4:30 p.m. will not be returned until the following business day.  For prescription refill requests, have your pharmacy contact our office.       Resources For Cancer Patients and their Caregivers ? American Cancer Society: Can assist with transportation, wigs, general needs, runs Look Good Feel Better.        770-259-8526 ? Cancer Care: Provides financial assistance, online support groups, medication/co-pay assistance.   1-800-813-HOPE (440)102-1419) ? [place] Assists [place] cancer patients and their families through emotional , educational and financial support.  636-064-3532 ? [place] DSS Where to apply for food stamps, Medicaid and utility assistance. 959-526-2644 ? RCATS: Transportation to medical appointments. 872 033 9796 ? Social Security Administration: May apply for disability if have a Stage IV cancer. 581-110-0744 778-035-7968 ? [employer], Disability and Transit Services: Assists with nutrition, care and transit needs. [place] Support Programs: @10RELATIVEDAYS @ > Cancer Support Group  2nd Tuesday of the month 1pm-2pm, Journey Room  > Creative Journey  3rd Tuesday of the month 1130am-1pm, Journey Room  > Look Good Feel Better  1st Wednesday of the month 10am-12 noon, Journey Room (Call [place] to register [phone])

## 2016-02-17 NOTE — Progress Notes (Signed)
Tolerated infusion w/o adverse reaction.  Alert, in no distress.  VSS.  Discharged ambulatory.  

## 2016-02-18 LAB — IGG, IGA, IGM
IGM, SERUM: 92 mg/dL (ref 26–217)
IgA: 15 mg/dL — ABNORMAL LOW (ref 87–352)
IgG (Immunoglobin G), Serum: 1066 mg/dL (ref 700–1600)

## 2016-02-29 DIAGNOSIS — R05 Cough: Secondary | ICD-10-CM | POA: Diagnosis not present

## 2016-02-29 DIAGNOSIS — G4733 Obstructive sleep apnea (adult) (pediatric): Secondary | ICD-10-CM | POA: Diagnosis not present

## 2016-02-29 DIAGNOSIS — G47419 Narcolepsy without cataplexy: Secondary | ICD-10-CM | POA: Diagnosis not present

## 2016-02-29 DIAGNOSIS — E785 Hyperlipidemia, unspecified: Secondary | ICD-10-CM | POA: Diagnosis not present

## 2016-02-29 DIAGNOSIS — Z6826 Body mass index (BMI) 26.0-26.9, adult: Secondary | ICD-10-CM | POA: Diagnosis not present

## 2016-02-29 DIAGNOSIS — J449 Chronic obstructive pulmonary disease, unspecified: Secondary | ICD-10-CM | POA: Diagnosis not present

## 2016-02-29 DIAGNOSIS — M792 Neuralgia and neuritis, unspecified: Secondary | ICD-10-CM | POA: Diagnosis not present

## 2016-02-29 DIAGNOSIS — R509 Fever, unspecified: Secondary | ICD-10-CM | POA: Diagnosis not present

## 2016-03-11 DIAGNOSIS — M205X9 Other deformities of toe(s) (acquired), unspecified foot: Secondary | ICD-10-CM | POA: Diagnosis not present

## 2016-03-11 DIAGNOSIS — L851 Acquired keratosis [keratoderma] palmaris et plantaris: Secondary | ICD-10-CM | POA: Diagnosis not present

## 2016-03-11 DIAGNOSIS — L609 Nail disorder, unspecified: Secondary | ICD-10-CM | POA: Diagnosis not present

## 2016-03-11 DIAGNOSIS — M79675 Pain in left toe(s): Secondary | ICD-10-CM | POA: Diagnosis not present

## 2016-03-15 ENCOUNTER — Other Ambulatory Visit (HOSPITAL_COMMUNITY): Payer: Self-pay | Admitting: *Deleted

## 2016-03-16 ENCOUNTER — Encounter (HOSPITAL_COMMUNITY): Payer: Medicare Other | Attending: Oncology

## 2016-03-16 ENCOUNTER — Encounter (HOSPITAL_COMMUNITY): Payer: Medicare Other

## 2016-03-16 ENCOUNTER — Other Ambulatory Visit (HOSPITAL_COMMUNITY): Payer: Self-pay | Admitting: Oncology

## 2016-03-16 ENCOUNTER — Encounter (HOSPITAL_COMMUNITY): Payer: Self-pay

## 2016-03-16 VITALS — BP 122/60 | HR 66 | Temp 98.6°F | Resp 18 | Wt 151.8 lb

## 2016-03-16 DIAGNOSIS — D801 Nonfamilial hypogammaglobulinemia: Secondary | ICD-10-CM | POA: Insufficient documentation

## 2016-03-16 DIAGNOSIS — C8338 Diffuse large B-cell lymphoma, lymph nodes of multiple sites: Secondary | ICD-10-CM

## 2016-03-16 DIAGNOSIS — I871 Compression of vein: Secondary | ICD-10-CM | POA: Insufficient documentation

## 2016-03-16 DIAGNOSIS — N3000 Acute cystitis without hematuria: Secondary | ICD-10-CM

## 2016-03-16 DIAGNOSIS — E876 Hypokalemia: Secondary | ICD-10-CM

## 2016-03-16 DIAGNOSIS — R300 Dysuria: Secondary | ICD-10-CM

## 2016-03-16 LAB — URINALYSIS, ROUTINE W REFLEX MICROSCOPIC
Bilirubin Urine: NEGATIVE
GLUCOSE, UA: NEGATIVE mg/dL
HGB URINE DIPSTICK: NEGATIVE
KETONES UR: NEGATIVE mg/dL
NITRITE: POSITIVE — AB
PROTEIN: NEGATIVE mg/dL
Specific Gravity, Urine: 1.009 (ref 1.005–1.030)
pH: 6 (ref 5.0–8.0)

## 2016-03-16 LAB — CBC WITH DIFFERENTIAL/PLATELET
Basophils Absolute: 0 10*3/uL (ref 0.0–0.1)
Basophils Relative: 0 %
EOS PCT: 3 %
Eosinophils Absolute: 0.2 10*3/uL (ref 0.0–0.7)
HCT: 37.2 % (ref 36.0–46.0)
HEMOGLOBIN: 12.2 g/dL (ref 12.0–15.0)
LYMPHS ABS: 1.6 10*3/uL (ref 0.7–4.0)
LYMPHS PCT: 27 %
MCH: 31 pg (ref 26.0–34.0)
MCHC: 32.8 g/dL (ref 30.0–36.0)
MCV: 94.7 fL (ref 78.0–100.0)
MONOS PCT: 8 %
Monocytes Absolute: 0.5 10*3/uL (ref 0.1–1.0)
Neutro Abs: 3.6 10*3/uL (ref 1.7–7.7)
Neutrophils Relative %: 62 %
Platelets: 218 10*3/uL (ref 150–400)
RBC: 3.93 MIL/uL (ref 3.87–5.11)
RDW: 14.1 % (ref 11.5–15.5)
WBC: 5.9 10*3/uL (ref 4.0–10.5)

## 2016-03-16 LAB — COMPREHENSIVE METABOLIC PANEL
ALK PHOS: 89 U/L (ref 38–126)
ALT: 17 U/L (ref 14–54)
AST: 21 U/L (ref 15–41)
Albumin: 3.5 g/dL (ref 3.5–5.0)
Anion gap: 9 (ref 5–15)
BUN: 10 mg/dL (ref 6–20)
CALCIUM: 8.8 mg/dL — AB (ref 8.9–10.3)
CO2: 30 mmol/L (ref 22–32)
CREATININE: 1.02 mg/dL — AB (ref 0.44–1.00)
Chloride: 95 mmol/L — ABNORMAL LOW (ref 101–111)
GFR, EST NON AFRICAN AMERICAN: 58 mL/min — AB (ref >60)
Glucose, Bld: 124 mg/dL — ABNORMAL HIGH (ref 65–99)
Potassium: 3 mmol/L — ABNORMAL LOW (ref 3.5–5.1)
Sodium: 134 mmol/L — ABNORMAL LOW (ref 135–145)
TOTAL PROTEIN: 7.2 g/dL (ref 6.5–8.1)
Total Bilirubin: 0.4 mg/dL (ref 0.3–1.2)

## 2016-03-16 LAB — PROTIME-INR
INR: 1.96
PROTHROMBIN TIME: 22.7 s — AB (ref 11.4–15.2)

## 2016-03-16 MED ORDER — CIPROFLOXACIN HCL 250 MG PO TABS: 250.0000 mg | ORAL_TABLET | Freq: Two times a day (BID) | ORAL | 0 refills | Status: AC

## 2016-03-16 MED ORDER — IMMUNE GLOBULIN (HUMAN) 10 GM/100ML IV SOLN
50.0000 g | Freq: Once | INTRAVENOUS | Status: AC
  Administered 2016-03-16: 50 g via INTRAVENOUS
  Filled 2016-03-16: qty 400

## 2016-03-16 MED ORDER — DIPHENHYDRAMINE HCL 25 MG PO CAPS
25.0000 mg | ORAL_CAPSULE | Freq: Once | ORAL | Status: AC
  Administered 2016-03-16: 25 mg via ORAL
  Filled 2016-03-16: qty 1

## 2016-03-16 MED ORDER — SODIUM CHLORIDE 0.9 % IV SOLN: Freq: Once | INTRAVENOUS | Status: DC

## 2016-03-16 MED ORDER — POTASSIUM CHLORIDE CRYS ER 20 MEQ PO TBCR: 20.0000 meq | EXTENDED_RELEASE_TABLET | Freq: Two times a day (BID) | ORAL | 0 refills | Status: DC

## 2016-03-16 MED ORDER — DEXTROSE 5 % IV SOLN
INTRAVENOUS | Status: DC
  Administered 2016-03-16: 12:00:00 via INTRAVENOUS

## 2016-03-16 MED ORDER — ACETAMINOPHEN 325 MG PO TABS
650.0000 mg | ORAL_TABLET | Freq: Four times a day (QID) | ORAL | Status: DC | PRN
  Administered 2016-03-16: 650 mg via ORAL
  Filled 2016-03-16: qty 2

## 2016-03-16 MED ORDER — SODIUM CHLORIDE 0.9 % IJ SOLN: 3.0000 mL | Freq: Once | INTRAMUSCULAR | Status: DC | PRN

## 2016-03-16 NOTE — Progress Notes (Signed)
Tolerated IVIG infusion well. Stable and ambulatory on discharge home to self.

## 2016-03-16 NOTE — Patient Instructions (Signed)
[facility] at [facility] Discharge Instructions  RECOMMENDATIONS MADE BY THE CONSULTANT AND ANY TEST RESULTS WILL BE SENT TO YOUR REFERRING PHYSICIAN.  Received IVIG infusion today. Follow-up as scheduled. Call clinic for any questions or concerns  Thank you for choosing  [facility] at [facility] to provide your oncology and hematology care.  To afford each patient quality time with our provider, please arrive at least 15 minutes before your scheduled appointment time.    If you have a lab appointment with the Cancer Center please come in thru the  Main Entrance and check in at the main information desk  You need to re-schedule your appointment should you arrive 10 or more minutes late.  We strive to give you quality time with our providers, and arriving late affects you and other patients whose appointments are after yours.  Also, if you no show three or more times for appointments you may be dismissed from the clinic at the providers discretion.     Again, thank you for choosing [facility].  Our hope is that these requests will decrease the amount of time that you wait before being seen by our physicians.       _____________________________________________________________  Should you have questions after your visit to  Cancer Center, please contact our office at [phone] between the hours of 8:30 a.m. and 4:30 p.m.  Voicemails left after 4:30 p.m. will not be returned until the following business day.  For prescription refill requests, have your pharmacy contact our office.       Resources For Cancer Patients and their Caregivers ? American Cancer Society: Can assist with transportation, wigs, general needs, runs Look Good Feel Better.        1-888-227-6333 ? Cancer Care: Provides financial assistance, online support groups, medication/co-pay assistance.  1-800-813-HOPE (4673) ? [facility] Assists [place] cancer patients and their families through emotional , educational and financial support.  336-427-4357 ? [place] DSS Where to apply for food stamps, Medicaid and utility assistance. 336-342-1394 ? RCATS: Transportation to medical appointments. 336-347-2287 ? Social Security Administration: May apply for disability if have a Stage IV cancer. 336-342-7796 1-800-772-1213 ? [place] Aging, Disability and Transit Services: Assists with nutrition, care and transit needs. 336-349-2343  Cancer Center Support Programs: @10RELATIVEDAYS@ > Cancer Support Group  2nd Tuesday of the month 1pm-2pm, Journey Room  > Creative Journey  3rd Tuesday of the month 1130am-1pm, Journey Room  > Look Good Feel Better  1st Wednesday of the month 10am-12 noon, Journey Room (Call American Cancer Society to register 1-800-395-5775)   

## 2016-03-16 NOTE — Progress Notes (Signed)
1155 Pt has been experiencing fever and chills for the last 3 weeks with her fever up to 101. Pt was put on Amoxicillin for 2 weeks which she has completed and stopped having a fever for 3 or 4 days but started back over the weekend. Pt does report some burning with urination and chronic sinus issues but otherwise no problems. Spoke with [name] PA regarding this issue. Pt will receive her IVIG today and we will obtain a urine specimen for U/A                                                        [name] tolerated IVIG infusion well without complaints or incident. VSS upon discharge. Pt discharged self ambulatory in satisfactory condition

## 2016-03-17 LAB — IGG, IGA, IGM
IGG (IMMUNOGLOBIN G), SERUM: 1034 mg/dL (ref 700–1600)
IgA: 14 mg/dL — ABNORMAL LOW (ref 87–352)
IgM, Serum: 92 mg/dL (ref 26–217)

## 2016-03-19 LAB — URINE CULTURE: Culture: 10000 — AB

## 2016-03-21 ENCOUNTER — Other Ambulatory Visit (HOSPITAL_COMMUNITY): Payer: Self-pay | Admitting: Oncology

## 2016-03-21 DIAGNOSIS — R350 Frequency of micturition: Secondary | ICD-10-CM | POA: Diagnosis not present

## 2016-03-21 DIAGNOSIS — N390 Urinary tract infection, site not specified: Secondary | ICD-10-CM | POA: Diagnosis not present

## 2016-03-21 DIAGNOSIS — Z6826 Body mass index (BMI) 26.0-26.9, adult: Secondary | ICD-10-CM | POA: Diagnosis not present

## 2016-03-21 DIAGNOSIS — N3000 Acute cystitis without hematuria: Secondary | ICD-10-CM

## 2016-03-21 DIAGNOSIS — N342 Other urethritis: Secondary | ICD-10-CM | POA: Diagnosis not present

## 2016-03-21 DIAGNOSIS — Z1389 Encounter for screening for other disorder: Secondary | ICD-10-CM | POA: Diagnosis not present

## 2016-03-21 MED ORDER — NITROFURANTOIN MONOHYD MACRO 100 MG PO CAPS: 100.0000 mg | ORAL_CAPSULE | Freq: Two times a day (BID) | ORAL | 0 refills | Status: DC

## 2016-03-25 ENCOUNTER — Encounter (HOSPITAL_BASED_OUTPATIENT_CLINIC_OR_DEPARTMENT_OTHER): Payer: Medicare Other

## 2016-03-25 ENCOUNTER — Encounter (HOSPITAL_COMMUNITY): Payer: Self-pay

## 2016-03-25 DIAGNOSIS — D801 Nonfamilial hypogammaglobulinemia: Secondary | ICD-10-CM | POA: Diagnosis not present

## 2016-03-25 DIAGNOSIS — N3000 Acute cystitis without hematuria: Secondary | ICD-10-CM

## 2016-03-25 MED ORDER — LIDOCAINE HCL (PF) 1 % IJ SOLN
INTRAMUSCULAR | Status: AC
  Filled 2016-03-25: qty 5

## 2016-03-25 MED ORDER — CEFTRIAXONE SODIUM 1 G IJ SOLR
1.0000 g | Freq: Once | INTRAMUSCULAR | Status: AC
  Administered 2016-03-25: 1 g via INTRAMUSCULAR
  Filled 2016-03-25: qty 10

## 2016-03-25 NOTE — Progress Notes (Signed)
[name] presents today for injection per MD orders. Rocephin 1 gram administered IM in the left upper buttocks Administration without incident. Patient tolerated well. Vitals stable and discharged home from clinic ambulatory. Follow up as scheduled.   Patient will call on Monday to let us know if she feels better or not. Instructed patient to continue Macrobid for now till Monday per [name] PA_C.

## 2016-03-25 NOTE — Patient Instructions (Signed)
[facility] at [facility] Discharge Instructions  RECOMMENDATIONS MADE BY THE CONSULTANT AND ANY TEST RESULTS WILL BE SENT TO YOUR REFERRING PHYSICIAN.  1 gram of Rocephin IM given Follow up as scheduled  Thank you for choosing [facility] at [facility] to provide your oncology and hematology care.  To afford each patient quality time with our provider, please arrive at least 15 minutes before your scheduled appointment time.    If you have a lab appointment with the [facility] please come in thru the  Main Entrance and check in at the main information desk  You need to re-schedule your appointment should you arrive 10 or more minutes late.  We strive to give you quality time with our providers, and arriving late affects you and other patients whose appointments are after yours.  Also, if you no show three or more times for appointments you may be dismissed from the clinic at the providers discretion.     Again, thank you for choosing [facility].  Our hope is that these requests will decrease the amount of time that you wait before being seen by our physicians.       _____________________________________________________________  Should you have questions after your visit to [facility], please contact our office at [phone] between the hours of 8:30 a.m. and 4:30 p.m.  Voicemails left after 4:30 p.m. will not be returned until the following business day.  For prescription refill requests, have your pharmacy contact our office.       Resources For Cancer Patients and their Caregivers ? American Cancer Society: Can assist with transportation, wigs, general needs, runs Look Good Feel Better.        (727)526-3966 ? Cancer Care: Provides financial assistance, online support groups, medication/co-pay assistance.  1-800-813-HOPE 505-743-5839) ? [facility] Assists [place] cancer patients  and their families through emotional , educational and financial support.  878-844-9096 ? [place] DSS Where to apply for food stamps, Medicaid and utility assistance. 249 127 0085 ? RCATS: Transportation to medical appointments. 458-022-6039 ? Social Security Administration: May apply for disability if have a Stage IV cancer. 631-463-5135 5312580791 ? [employer], Disability and Transit Services: Assists with nutrition, care and transit needs. [place] Support Programs: @10RELATIVEDAYS @ > Cancer Support Group  2nd Tuesday of the month 1pm-2pm, Journey Room  > Creative Journey  3rd Tuesday of the month 1130am-1pm, Journey Room  > Look Good Feel Better  1st Wednesday of the month 10am-12 noon, Journey Room (Call [facility] to register [phone])

## 2016-04-07 ENCOUNTER — Ambulatory Visit (INDEPENDENT_AMBULATORY_CARE_PROVIDER_SITE_OTHER): Payer: Medicare Other | Admitting: Adult Health

## 2016-04-07 ENCOUNTER — Encounter: Payer: Self-pay | Admitting: Adult Health

## 2016-04-07 VITALS — BP 118/60 | HR 72 | Temp 98.4°F | Ht 60.5 in | Wt 152.0 lb

## 2016-04-07 DIAGNOSIS — R319 Hematuria, unspecified: Secondary | ICD-10-CM | POA: Diagnosis not present

## 2016-04-07 DIAGNOSIS — N952 Postmenopausal atrophic vaginitis: Secondary | ICD-10-CM | POA: Diagnosis not present

## 2016-04-07 DIAGNOSIS — R300 Dysuria: Secondary | ICD-10-CM | POA: Diagnosis not present

## 2016-04-07 DIAGNOSIS — Z113 Encounter for screening for infections with a predominantly sexual mode of transmission: Secondary | ICD-10-CM | POA: Diagnosis not present

## 2016-04-07 DIAGNOSIS — Z1211 Encounter for screening for malignant neoplasm of colon: Secondary | ICD-10-CM

## 2016-04-07 DIAGNOSIS — Z1212 Encounter for screening for malignant neoplasm of rectum: Secondary | ICD-10-CM

## 2016-04-07 HISTORY — DX: Hematuria, unspecified: R31.9

## 2016-04-07 LAB — POCT URINALYSIS DIPSTICK
Glucose, UA: NEGATIVE
Ketones, UA: NEGATIVE
LEUKOCYTES UA: NEGATIVE
NITRITE UA: NEGATIVE
PROTEIN UA: NEGATIVE

## 2016-04-07 LAB — HEMOCCULT GUIAC POC 1CARD (OFFICE): FECAL OCCULT BLD: NEGATIVE

## 2016-04-07 NOTE — Progress Notes (Signed)
Subjective:     Patient ID: [name], female   DOB: April 13, 1955, 61 y.o.   MRN: [id number]  HPI [place] is a 61 year old white female, divorced in for pelvic exam, she thought she needed pap,but is sp hysterectomy for fibroids. She is having burning with urination, and wants STD testing ex boyfriend cheated.She is not currently sexually active.She has had non Hodgkin's lymphoma.  Review of Systems Burning with urination Not having sex,but was painful last time she did No problems with bowel movements  Reviewed past medical,surgical, social and family history. Reviewed medications and allergies.     Objective:   Physical Exam BP 118/60 (BP Location: Left Arm, Patient Position: Sitting, Cuff Size: Normal)   Pulse 72   Temp 98.4 F (36.9 C)   Ht 5' 0.5" (1.537 m)   Wt 152 lb (68.9 kg)   BMI 29.20 kg/m urine dipstick trace blood.   Skin warm and dry, Abdomen is soft and non tender, no HSM.Pelvic: external genitalia is normal in appearance no lesions, vagina: atrophic, pale, smooth and dry,urethra has no lesions or masses noted, cervix and uterus absent, adnexa: no masses or tenderness noted. Bladder is non tender and no masses felt.  GC/CHL obtained.  Rectal exam shows good tone and no polyps or hemorrhoids and hemoccult is negative. PHQ 2 score 0. Discussed if GC/CHL and UA C&S normal, may try vaginal estrogen.  Assessment:     1. Burning with urination   2. Hematuria, unspecified type   3. Vaginal atrophy   4. Screening examination for STD (sexually transmitted disease)   5. Screening for colorectal cancer       Plan:     GC/CHL sent UA C&S sent  Physical in 1 year Mammogram yearly Labs at cancer center

## 2016-04-08 LAB — URINALYSIS, ROUTINE W REFLEX MICROSCOPIC
BILIRUBIN UA: NEGATIVE
Glucose, UA: NEGATIVE
Ketones, UA: NEGATIVE
NITRITE UA: NEGATIVE
PROTEIN UA: NEGATIVE
RBC UA: NEGATIVE
Specific Gravity, UA: 1.007 (ref 1.005–1.030)
UUROB: 0.2 mg/dL (ref 0.2–1.0)
pH, UA: 7 (ref 5.0–7.5)

## 2016-04-08 LAB — MICROSCOPIC EXAMINATION: Casts: NONE SEEN /lpf

## 2016-04-09 LAB — URINE CULTURE: ORGANISM ID, BACTERIA: NO GROWTH

## 2016-04-12 LAB — GC/CHLAMYDIA PROBE AMP
CHLAMYDIA, DNA PROBE: NEGATIVE
NEISSERIA GONORRHOEAE BY PCR: NEGATIVE

## 2016-04-14 ENCOUNTER — Encounter (HOSPITAL_COMMUNITY): Payer: Self-pay

## 2016-04-14 ENCOUNTER — Encounter (HOSPITAL_COMMUNITY): Payer: Medicare Other

## 2016-04-14 ENCOUNTER — Encounter (HOSPITAL_COMMUNITY): Payer: Medicare Other | Attending: Oncology

## 2016-04-14 VITALS — BP 136/81 | HR 83 | Temp 98.4°F | Resp 18 | Wt 149.0 lb

## 2016-04-14 DIAGNOSIS — C8338 Diffuse large B-cell lymphoma, lymph nodes of multiple sites: Secondary | ICD-10-CM

## 2016-04-14 DIAGNOSIS — D801 Nonfamilial hypogammaglobulinemia: Secondary | ICD-10-CM | POA: Diagnosis not present

## 2016-04-14 DIAGNOSIS — I871 Compression of vein: Secondary | ICD-10-CM

## 2016-04-14 LAB — CBC WITH DIFFERENTIAL/PLATELET
BASOS PCT: 0 %
Basophils Absolute: 0 10*3/uL (ref 0.0–0.1)
EOS ABS: 0.2 10*3/uL (ref 0.0–0.7)
Eosinophils Relative: 2 %
HEMATOCRIT: 35.7 % — AB (ref 36.0–46.0)
HEMOGLOBIN: 11.3 g/dL — AB (ref 12.0–15.0)
Lymphocytes Relative: 14 %
Lymphs Abs: 1.3 10*3/uL (ref 0.7–4.0)
MCH: 30 pg (ref 26.0–34.0)
MCHC: 31.7 g/dL (ref 30.0–36.0)
MCV: 94.7 fL (ref 78.0–100.0)
Monocytes Absolute: 0.7 10*3/uL (ref 0.1–1.0)
Monocytes Relative: 8 %
NEUTROS ABS: 7.6 10*3/uL (ref 1.7–7.7)
NEUTROS PCT: 76 %
Platelets: 219 10*3/uL (ref 150–400)
RBC: 3.77 MIL/uL — AB (ref 3.87–5.11)
RDW: 14.5 % (ref 11.5–15.5)
WBC: 9.9 10*3/uL (ref 4.0–10.5)

## 2016-04-14 LAB — COMPREHENSIVE METABOLIC PANEL
ALBUMIN: 3 g/dL — AB (ref 3.5–5.0)
ALT: 13 U/L — ABNORMAL LOW (ref 14–54)
AST: 17 U/L (ref 15–41)
Alkaline Phosphatase: 110 U/L (ref 38–126)
Anion gap: 9 (ref 5–15)
BUN: 12 mg/dL (ref 6–20)
CHLORIDE: 100 mmol/L — AB (ref 101–111)
CO2: 29 mmol/L (ref 22–32)
Calcium: 8.8 mg/dL — ABNORMAL LOW (ref 8.9–10.3)
Creatinine, Ser: 0.8 mg/dL (ref 0.44–1.00)
GFR calc Af Amer: >60 mL/min (ref >60)
Glucose, Bld: 101 mg/dL — ABNORMAL HIGH (ref 65–99)
POTASSIUM: 3.4 mmol/L — AB (ref 3.5–5.1)
SODIUM: 138 mmol/L (ref 135–145)
Total Bilirubin: 0.4 mg/dL (ref 0.3–1.2)
Total Protein: 7.1 g/dL (ref 6.5–8.1)

## 2016-04-14 LAB — PROTIME-INR
INR: 5.05
PROTHROMBIN TIME: 47.9 s — AB (ref 11.4–15.2)

## 2016-04-14 MED ORDER — IMMUNE GLOBULIN (HUMAN) 10 GM/100ML IV SOLN
50.0000 g | Freq: Once | INTRAVENOUS | Status: AC
Start: 2016-04-14 — End: 2016-04-14
  Administered 2016-04-14: 50 g via INTRAVENOUS
  Filled 2016-04-14: qty 400

## 2016-04-14 MED ORDER — DEXTROSE 5 % IV SOLN
INTRAVENOUS | Status: DC
  Administered 2016-04-14: 09:00:00 via INTRAVENOUS

## 2016-04-14 MED ORDER — DIPHENHYDRAMINE HCL 25 MG PO CAPS
25.0000 mg | ORAL_CAPSULE | Freq: Once | ORAL | Status: AC
  Administered 2016-04-14: 25 mg via ORAL

## 2016-04-14 MED ORDER — ACETAMINOPHEN 325 MG PO TABS
ORAL_TABLET | ORAL | Status: AC
  Filled 2016-04-14: qty 2

## 2016-04-14 MED ORDER — ACETAMINOPHEN 325 MG PO TABS
650.0000 mg | ORAL_TABLET | Freq: Four times a day (QID) | ORAL | Status: DC | PRN
  Administered 2016-04-14: 650 mg via ORAL

## 2016-04-14 MED ORDER — SODIUM CHLORIDE 0.9 % IJ SOLN
3.0000 mL | Freq: Once | INTRAMUSCULAR | Status: AC | PRN
  Administered 2016-04-14: 3 mL via INTRAVENOUS

## 2016-04-14 MED ORDER — DIPHENHYDRAMINE HCL 25 MG PO CAPS
ORAL_CAPSULE | ORAL | Status: AC
  Filled 2016-04-14: qty 1

## 2016-04-14 MED ORDER — SODIUM CHLORIDE 0.9 % IV SOLN: Freq: Once | INTRAVENOUS | Status: DC

## 2016-04-14 NOTE — Patient Instructions (Signed)
[facility] at [facility] Discharge Instructions  RECOMMENDATIONS MADE BY THE CONSULTANT AND ANY TEST RESULTS WILL BE SENT TO YOUR REFERRING PHYSICIAN.  Received IVIG infusion today. Follow-up as scheduled. Call clinic for any questions or concerns  Thank you for choosing [facility] at [facility] to provide your oncology and hematology care.  To afford each patient quality time with our provider, please arrive at least 15 minutes before your scheduled appointment time.    If you have a lab appointment with the Cancer Center please come in thru the  Main Entrance and check in at the main information desk  You need to re-schedule your appointment should you arrive 10 or more minutes late.  We strive to give you quality time with our providers, and arriving late affects you and other patients whose appointments are after yours.  Also, if you no show three or more times for appointments you may be dismissed from the clinic at the providers discretion.     Again, thank you for choosing [facility].  Our hope is that these requests will decrease the amount of time that you wait before being seen by our physicians.       _____________________________________________________________  Should you have questions after your visit to [facility], please contact our office at [phone] between the hours of 8:30 a.m. and 4:30 p.m.  Voicemails left after 4:30 p.m. will not be returned until the following business day.  For prescription refill requests, have your pharmacy contact our office.       Resources For Cancer Patients and their Caregivers ? American Cancer Society: Can assist with transportation, wigs, general needs, runs Look Good Feel Better.        1-888-227-6333 ? Cancer Care: Provides financial assistance, online support groups, medication/co-pay assistance.  1-800-813-HOPE (4673) ? [facility] Assists [place] cancer patients and their families through emotional , educational and financial support.  336-427-4357 ? [place] DSS Where to apply for food stamps, Medicaid and utility assistance. 336-342-1394 ? RCATS: Transportation to medical appointments. 336-347-2287 ? Social Security Administration: May apply for disability if have a Stage IV cancer. 336-342-7796 1-800-772-1213 ? [place] Aging, Disability and Transit Services: Assists with nutrition, care and transit needs. 336-349-2343  Cancer Center Support Programs: @10RELATIVEDAYS@ > Cancer Support Group  2nd Tuesday of the month 1pm-2pm, Journey Room  > Creative Journey  3rd Tuesday of the month 1130am-1pm, Journey Room  > Look Good Feel Better  1st Wednesday of the month 10am-12 noon, Journey Room (Call American Cancer Society to register 1-800-395-5775)   

## 2016-04-14 NOTE — Progress Notes (Signed)
CRITICAL VALUE ALERT Critical value received:  INR 5.05 Date of notification:  04/14/16 Time of notification: 0850 Critical value read back:  Yes.   Nurse who received alert:  T.Koty Anctil,RN MD notified (1st page):  [phone]  Received above. Questioned patient about who prescribes coumadin and follows PT/INR. Patient reports Dr.Golding. Lab as well as patient comments regarding coumadin faxed to his office. Called his office to report critical lab. Spoke to his nurse Judeen Hammans. Information given. Judeen Hammans stated Dr.Golding does not follow her for coumadin/INR checks. I stated maybe patient was confused and gave me the wrong information but I would check again with patient. Spoke with patient a second time. She reports she saw Dr.Golding apx 1 month ago. She stated she asked him who was supposed to be following her regarding her coumadin. She states Dr.Golding said he should be following PT/INR. She said he gave a standing order for her to have INR checks at the lab behind his office or she could get it checked when she was here monthly for her infusion. S[place]a states years ago [place],, then Dr.Fusco at [facility] took care of her coumadin and Dr.Golding is primary there now, and they talked about a gap in her coumadin care 1 month ago. 2nd call back to talk to [facility]. Gave her information per patient as above. She states that she will give information to [facility] and see what he says. Reiterated that we do not manage her coumadin and she does not see cardiology and reports that [place] is her primary care physician and he did agree to manage her coumadin at her last office visit.  As a courtesy to patient I did show PT/INR results to T.Kefalas,PA. As of present patient has not heard back from [facility] office. Instructed patient per Gershon Mussel to hold coumadin dose for this evening. Instruct if she has not heard from [facility] by tomorrow for instructions regarding coumadin to call us back and we will help her follow  up.

## 2016-04-14 NOTE — Progress Notes (Signed)
Spoke with [name] PA-C regarding pt's continued low grade fevers, below 100.4, which occur in the evenings with achiness in her back at times.Pt usually takes Tylenol which is effective. She has been feeling bad a lot of the time as well.Labs reviewed with PA also. Pt to receive her IVIG today, keep her scheduled. MD appt and continue to take Tylenol for her low grade fevers.Pt verbalized understanding.                                    Arletha Pili tolerated her IVIG infusion well without complaints or incident. VSS upon discharge. Pt discharged self ambulatory in satisfactory condition

## 2016-04-15 ENCOUNTER — Other Ambulatory Visit (HOSPITAL_COMMUNITY): Payer: Medicare Other

## 2016-04-15 ENCOUNTER — Ambulatory Visit (HOSPITAL_COMMUNITY): Payer: Medicare Other

## 2016-04-15 LAB — IGG, IGA, IGM
IGA: 13 mg/dL — AB (ref 87–352)
IGG (IMMUNOGLOBIN G), SERUM: 1048 mg/dL (ref 700–1600)
IgM, Serum: 171 mg/dL (ref 26–217)

## 2016-04-17 ENCOUNTER — Other Ambulatory Visit (HOSPITAL_COMMUNITY): Payer: Self-pay | Admitting: Oncology

## 2016-04-20 DIAGNOSIS — Z6826 Body mass index (BMI) 26.0-26.9, adult: Secondary | ICD-10-CM | POA: Diagnosis not present

## 2016-04-20 DIAGNOSIS — R319 Hematuria, unspecified: Secondary | ICD-10-CM | POA: Diagnosis not present

## 2016-04-20 DIAGNOSIS — H6591 Unspecified nonsuppurative otitis media, right ear: Secondary | ICD-10-CM | POA: Diagnosis not present

## 2016-04-25 ENCOUNTER — Telehealth: Payer: Self-pay | Admitting: Adult Health

## 2016-04-25 NOTE — Telephone Encounter (Signed)
Pt aware GC/CHL negative

## 2016-04-26 DIAGNOSIS — K219 Gastro-esophageal reflux disease without esophagitis: Secondary | ICD-10-CM | POA: Diagnosis not present

## 2016-04-26 DIAGNOSIS — J209 Acute bronchitis, unspecified: Secondary | ICD-10-CM | POA: Diagnosis not present

## 2016-04-26 DIAGNOSIS — I10 Essential (primary) hypertension: Secondary | ICD-10-CM | POA: Diagnosis not present

## 2016-04-26 DIAGNOSIS — Z6825 Body mass index (BMI) 25.0-25.9, adult: Secondary | ICD-10-CM | POA: Diagnosis not present

## 2016-04-27 ENCOUNTER — Other Ambulatory Visit: Payer: Self-pay | Admitting: Adult Health

## 2016-04-27 DIAGNOSIS — Z1231 Encounter for screening mammogram for malignant neoplasm of breast: Secondary | ICD-10-CM

## 2016-04-28 ENCOUNTER — Ambulatory Visit (HOSPITAL_COMMUNITY)
Admission: RE | Admit: 2016-04-28 | Discharge: 2016-04-28 | Disposition: A | Discharge status: Home / Self Care | Payer: Medicare Other | Source: Ambulatory Visit | Attending: Adult Health | Admitting: Adult Health

## 2016-04-28 DIAGNOSIS — Z1231 Encounter for screening mammogram for malignant neoplasm of breast: Secondary | ICD-10-CM | POA: Diagnosis not present

## 2016-05-02 ENCOUNTER — Encounter: Payer: Self-pay | Admitting: Family Medicine

## 2016-05-02 ENCOUNTER — Ambulatory Visit (INDEPENDENT_AMBULATORY_CARE_PROVIDER_SITE_OTHER): Payer: Medicare Other | Admitting: Family Medicine

## 2016-05-02 VITALS — BP 136/78 | HR 88 | Temp 97.4°F | Resp 18 | Ht 63.0 in | Wt 147.1 lb

## 2016-05-02 DIAGNOSIS — E785 Hyperlipidemia, unspecified: Secondary | ICD-10-CM | POA: Diagnosis not present

## 2016-05-02 DIAGNOSIS — G620 Drug-induced polyneuropathy: Secondary | ICD-10-CM

## 2016-05-02 DIAGNOSIS — T451X5A Adverse effect of antineoplastic and immunosuppressive drugs, initial encounter: Secondary | ICD-10-CM

## 2016-05-02 DIAGNOSIS — Z1159 Encounter for screening for other viral diseases: Secondary | ICD-10-CM | POA: Diagnosis not present

## 2016-05-02 DIAGNOSIS — I10 Essential (primary) hypertension: Secondary | ICD-10-CM | POA: Diagnosis not present

## 2016-05-02 DIAGNOSIS — Z7689 Persons encountering health services in other specified circumstances: Secondary | ICD-10-CM

## 2016-05-02 DIAGNOSIS — R7309 Other abnormal glucose: Secondary | ICD-10-CM

## 2016-05-02 DIAGNOSIS — E538 Deficiency of other specified B group vitamins: Secondary | ICD-10-CM

## 2016-05-02 DIAGNOSIS — B001 Herpesviral vesicular dermatitis: Secondary | ICD-10-CM

## 2016-05-02 HISTORY — DX: Adverse effect of antineoplastic and immunosuppressive drugs, initial encounter: G62.0

## 2016-05-02 HISTORY — DX: Adverse effect of antineoplastic and immunosuppressive drugs, initial encounter: T45.1X5A

## 2016-05-02 LAB — LIPID PANEL
Cholesterol: 162 mg/dL (ref <200)
HDL: 65 mg/dL (ref >50)
LDL Cholesterol: 67 mg/dL (ref <100)
Total CHOL/HDL Ratio: 2.5 Ratio (ref <5.0)
Triglycerides: 150 mg/dL — ABNORMAL HIGH (ref <150)
VLDL: 30 mg/dL (ref <30)

## 2016-05-02 LAB — HEPATITIS C ANTIBODY: HCV Ab: NEGATIVE

## 2016-05-02 LAB — URINALYSIS, ROUTINE W REFLEX MICROSCOPIC
Bilirubin Urine: NEGATIVE
GLUCOSE, UA: NEGATIVE
Hgb urine dipstick: NEGATIVE
Ketones, ur: NEGATIVE
Nitrite: NEGATIVE
PROTEIN: NEGATIVE
Specific Gravity, Urine: 1.007 (ref 1.001–1.035)
pH: 6.5 (ref 5.0–8.0)

## 2016-05-02 LAB — URINALYSIS, MICROSCOPIC ONLY
Bacteria, UA: NONE SEEN [HPF]
CASTS: NONE SEEN [LPF]
Crystals: NONE SEEN [HPF]
Squamous Epithelial / LPF: NONE SEEN [HPF] (ref <=5)
Yeast: NONE SEEN [HPF]

## 2016-05-02 LAB — VITAMIN B12: Vitamin B-12: 1679 pg/mL — ABNORMAL HIGH (ref 200–1100)

## 2016-05-02 MED ORDER — ALENDRONATE SODIUM 70 MG PO TABS: 70.0000 mg | ORAL_TABLET | ORAL | 3 refills | Status: DC

## 2016-05-02 MED ORDER — FUROSEMIDE 20 MG PO TABS: 20.0000 mg | ORAL_TABLET | ORAL | 1 refills | Status: DC | PRN

## 2016-05-02 NOTE — Patient Instructions (Signed)
Take the fosamax once a week I will inquire about a shingles shot Need updated lab tests I will send you a letter with your test results.  If there is anything of concern, we will call right away. See me in six months  Need old records

## 2016-05-02 NOTE — Progress Notes (Signed)
Chief Complaint  Patient presents with  . Establish Care   Complicated patient New to establish Partial records available Her PCP and many specialists are outside [facility] Dr Laural Golden - GI Dr Ashok Cordia - pulm Dr. Merlene Laughter - neurology/sleep Dr Talbert Cage - Oncology Dr Tarri Glenn - derm Dr Jorja Loa - eye Dr Florene Glen - Kidney Unknown GYN  She is changing due to difficulty getting into her old office She feels well today. Jazlene works full-time as a Secretary/administrator Her biggest ongoing issue is with her asthma, which Is severe and barely controlled. She is compliant with her medications. She is on prednisone daily. She still has chronic coughing, wheezing, shortness of breath and inability to exercise.  Because of the chronic prednisone her pulmonary doctor did a DEXA scan. She has osteoporosis. This is never been treated. I discussed the importance of calcium, vitamin D, regular weightbearing exercise, and did start her on Fosamax 70 mg once a week today. Instructions regarding taking this pill were given.  She has narcolepsy.  Is on both aderall and nuvigil to stay awake at work.  Since starting the adderall she has lost 20 lbs and feels quite energetic.  I suggested she perhaps see her MD about a lower dose.  PAP not indicated, Mammogram done. COLO up to date, all shots UTD.  Discussed new shingles snot and I will clear with oncology prior to giving.  Patient Active Problem List   Diagnosis Date Noted  . Peripheral neuropathy due to chemotherapy ([place]) 05/02/2016  . Osteoporosis 04/30/2015  . 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 Iron deficiency anemia 01/18/2015  . B12 deficiency 01/18/2015  . SVC syndrome 09/25/2014  . Sinusitis, chronic 05/06/2014  . Abnormal finding on imaging 02/11/2014  . OSA (obstructive sleep apnea) 09/10/2012  . 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 Hypogammaglobulinemia, acquired ([place]) 12/30/2009  . Non Hodgkin's lymphoma ([place])  09/09/2009  . HLD (hyperlipidemia) 04/21/2007  . Essential hypertension 04/21/2007  . DIABETES MELLITUS, BORDERLINE 04/21/2007  . Severe persistent asthma with acute exacerbation 04/03/2007  . ALLERGIC RHINITIS 10/19/2006  . VOCAL CORD DISORDER 10/19/2006  . GERD 10/19/2006  . GASTRIC POLYP 07/25/2006    Outpatient Encounter Prescriptions as of 05/02/2016  Medication Sig  . acetaminophen (TYLENOL) 500 MG tablet Take 1,000 mg by mouth every 6 (six) hours as needed for pain or fever.   [name] Kitchen acyclovir (ZOVIRAX) 400 MG tablet Take 1 tablet (400 mg total) by mouth 2 (two) times daily.  [name] Kitchen albuterol (PROAIR HFA) 108 (90 Base) MCG/ACT inhaler Inhale 2 puffs into the lungs every 6 (six) hours as needed.  [name] Kitchen albuterol (PROVENTIL) (2.5 MG/3ML) 0.083% nebulizer solution Take 2.5 mg by nebulization every 4 (four) hours as needed for wheezing or shortness of breath.  . amphetamine-dextroamphetamine (ADDERALL) 10 MG tablet Take 10 mg by mouth daily.  . Armodafinil (NUVIGIL) 150 MG tablet Take 150 mg by mouth daily.  . citalopram (CELEXA) 20 MG tablet Take 20 mg by mouth every morning.    . cyanocobalamin 1000 MCG tablet Take 1,000 mcg by mouth daily.  . furosemide (LASIX) 20 MG tablet Take 1 tablet (20 mg total) by mouth as needed for fluid.  [name] Kitchen gabapentin (NEURONTIN) 300 MG capsule TAKE 1 CAPSULE BY MOUTH FOUR TIMES DAILY.  . mometasone-formoterol (DULERA) 200-5 MCG/ACT AERO Inhale 2 puffs into the lungs 2 (two) times daily.  [name] Kitchen omeprazole (PRILOSEC) 20 MG capsule TAKE ONE CAPSULE BY MOUTH TWICE DAILY BEFORE A MEAL.  [name] Kitchen potassium chloride  SA (K-DUR,KLOR-CON) 20 MEQ tablet Take 1 tablet (20 mEq total) by mouth 2 (two) times daily.  . predniSONE (DELTASONE) 10 MG tablet TAKE 1 TABLET BY MOUTH ONCE DAILY. (Patient taking differently: TAKE 1/2 TABLET BY MOUTH ONCE DAILY.)  . simvastatin (ZOCOR) 80 MG tablet Take 80 mg by mouth at bedtime.    [name] Kitchen tiotropium (SPIRIVA) 18 MCG inhalation capsule Place 1 capsule (18 mcg  total) into inhaler and inhale daily.  [name] Kitchen warfarin (COUMADIN) 10 MG tablet Take 5 mg by mouth every evening. 4 mg daily  . zafirlukast (ACCOLATE) 20 MG tablet TAKE 1 TABLET BY MOUTH TWICE DAILY.  . [DISCONTINUED] furosemide (LASIX) 20 MG tablet Take 20 mg by mouth as needed for fluid.   [name] Kitchen alendronate (FOSAMAX) 70 MG tablet Take 1 tablet (70 mg total) by mouth once a week. Take with a full glass of water on an empty stomach.  . fluticasone (FLONASE) 50 MCG/ACT nasal spray   . [DISCONTINUED] warfarin (COUMADIN) 1 MG tablet    Facility-Administered Encounter Medications as of 05/02/2016  Medication  . 0.9 %  sodium chloride infusion  . dextrose 5 % solution  . sodium chloride 0.9 % injection 10 mL    Past Medical History:  Diagnosis Date  . Allergic rhinitis   . Allergy   . Anemia   . Anxiety   . Asthma   . B12 deficiency 01/18/2015  . Blood transfusion without reported diagnosis   . Cataract   . Cavitary lung disease   . Chronic kidney disease    related to cancer  . Chronic sinusitis   . Clotting disorder ([place])   . COPD (chronic obstructive pulmonary disease) ([place])   . Depression   . DVT (deep venous thrombosis) ([place]) 09/25/2014  . Endotracheally intubated   . GERD (gastroesophageal reflux disease)   . Hematuria 04/07/2016  . Hyperlipidemia   . Hypertension   . Hypogammaglobulinemia, acquired ([place]) 12/30/2009   Qualifier: Diagnosis of  By: Joya Gaskins MD, Burnett Harry   . Iron deficiency anemia 01/18/2015  . Myocardial infarction ([place]) 2011  . 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 Non Hodgkin's lymphoma ([place]) 2011  . Orofacial dyskinesia 04/22/2015  . Orofacial dyskinesia 04/22/2015  . Osteoporosis   . Peripheral neuropathy due to chemotherapy ([place]) 05/02/2016  . Pneumonia 01/2013  . PONV (postoperative nausea and vomiting)   . Respiratory failure ([place])   . Sleep apnea   . SVC syndrome 09/25/2014    Past Surgical History:  Procedure Laterality Date  . ABDOMINAL  HYSTERECTOMY     Fibroids  . BASAL CELL CARCINOMA EXCISION  03/2011   scalp  . BREAST SURGERY Right    biopsy  . CATARACT EXTRACTION W/PHACO Left 11/17/2014   Procedure: CATARACT EXTRACTION PHACO AND INTRAOCULAR LENS PLACEMENT LEFT EYE;  Surgeon: Tonny Branch, MD;  Location: [facility];  Service: Ophthalmology;  Laterality: Left;  CDE:5.60  . CATARACT EXTRACTION W/PHACO Right 12/15/2014   Procedure: CATARACT EXTRACTION PHACO AND INTRAOCULAR LENS PLACEMENT RIGHT EYE CDE=5.16;  Surgeon: Tonny Branch, MD;  Location: [facility];  Service: Ophthalmology;  Laterality: Right;  . COLONOSCOPY N/A 11/14/2012   Procedure: COLONOSCOPY;  Surgeon: Rogene Houston, MD;  Location: [facility];  Service: Endoscopy;  Laterality: N/A;  830  . EYE SURGERY    . NASAL SINUS SURGERY    . NECK SURGERY     x 2   . PERIPHERALLY INSERTED CENTRAL CATHETER INSERTION    . PICC Removal    .  PORT-A-CATH REMOVAL    . PORTACATH PLACEMENT  7/11   Removed 6/12  . TRACHEOSTOMY    . VESICOVAGINAL FISTULA CLOSURE W/ TAH      Social History   Social History  . Marital status: Divorced    Spouse name: N/A  . Number of children: 3  . Years of education: 12   Occupational History  . teller     [place] History Main Topics  . Smoking status: Never Smoker  . Smokeless tobacco: Never Used  . Alcohol use No  . Drug use: No  . Sexual activity: No     Comment: hyst   Other Topics Concern  . Not on file   Social History Narrative   Originally from [place]. Always lived in [place]. Prior travel to [place]. 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 She has 1 dog currently. She has 1 conures (small parrots). No mold exposure in her home. 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 No hot tub exposure. She enjoys sewing & quilting.     Family History  Problem Relation Age of Onset  . Emphysema Mother   . Stroke Mother   . COPD Mother   . Heart disease  Mother     died in sleep  64  . Allergies Father   . Asthma Father     as a child  . Arthritis Father   . Parkinson's disease Father 53  . Leukemia Maternal Grandmother   . Cancer Maternal Grandmother     Leukemia  . Diabetes Brother   . Heart attack Brother   . Hypertension Brother   . Heart disease Brother 40    stents  . Hypertension Sister   . Hypertension Brother     Review of Systems  Constitutional: Positive for weight loss. Negative for malaise/fatigue.  HENT: Negative for congestion and hearing loss.   Eyes: Negative for blurred vision and redness.  Respiratory: Positive for cough, sputum production, shortness of breath and wheezing.   Cardiovascular: Negative for chest pain, palpitations and leg swelling.  Gastrointestinal: Negative for heartburn and vomiting.  Genitourinary: Negative for dysuria and frequency.  Musculoskeletal: Negative for back pain and neck pain.  Neurological: Negative for dizziness.  Psychiatric/Behavioral: Negative for depression. The patient does not have insomnia.    BP 136/78 (BP Location: Right Arm, Patient Position: Sitting, Cuff Size: Normal)   Pulse 88   Temp 97.4 F (36.3 C) (Temporal)   Resp 18   Ht 5\' 3"  (1.6 m)   Wt 147 lb 1.3 oz (66.7 kg)   SpO2 95%   BMI 26.05 kg/m   Physical Exam  Constitutional: She is oriented to person, place, and time. She appears well-developed and well-nourished.  HENT:  Head: Normocephalic and atraumatic.  Mouth/Throat: Oropharynx is clear and moist.  Eyes: Conjunctivae are normal. Pupils are equal, round, and reactive to light.  Neck: Normal range of motion. Neck supple. No thyromegaly present.  Cardiovascular: Normal rate, regular rhythm and normal heart sounds.   Pulmonary/Chest: No respiratory distress. She has wheezes.  scattered rhonchi and wheeze.  Mildly dyspneic  Abdominal: Soft. Bowel sounds are normal.  Musculoskeletal: Normal range of motion. She exhibits no edema.    Lymphadenopathy:    She has no cervical adenopathy.  Neurological: She is alert and oriented to person, place, and time.  Gait normal  Skin: Skin is warm  and dry.  Skin thin, fragile, disoclored  Psychiatric: She has a normal mood and affect. Her behavior is normal. Thought content normal.  Nursing note and vitals reviewed.  ASSESSMENT/PLAN:  1. Encounter to establish care with new doctor   2. B12 deficiency  - VITAMIN D 25 Hydroxy (Vit-D Deficiency, Fractures) - Vitamin B12  3. DIABETES MELLITUS, BORDERLINE  - Urinalysis, Routine w reflex microscopic - Hemoglobin A1c  4. Hyperlipidemia, unspecified hyperlipidemia type  - Lipid panel  5. Peripheral neuropathy due to chemotherapy ([place]) On gabapentin  6. Encounter for hepatitis C screening test for low risk patient Discussed CDC recommendation and pt desires screening - Hepatitis C antibody  7. Essential hypertension controlled Greater than 50% of this visit was spent in counseling and coordinating care.  Total face to face time:  60 min   Patient Instructions  Take the fosamax once a week I will inquire about a shingles shot Need updated lab tests I will send you a letter with your test results.  If there is anything of concern, we will call right away. See me in six months  Need old records   [name], MD

## 2016-05-03 ENCOUNTER — Encounter: Payer: Self-pay | Admitting: Family Medicine

## 2016-05-03 LAB — HEMOGLOBIN A1C
HEMOGLOBIN A1C: 5.7 % — AB (ref <5.7)
Mean Plasma Glucose: 117 mg/dL

## 2016-05-03 LAB — VITAMIN D 25 HYDROXY (VIT D DEFICIENCY, FRACTURES): VIT D 25 HYDROXY: 65 ng/mL (ref 30–100)

## 2016-05-09 ENCOUNTER — Other Ambulatory Visit (HOSPITAL_COMMUNITY): Payer: Self-pay | Admitting: Oncology

## 2016-05-09 DIAGNOSIS — D801 Nonfamilial hypogammaglobulinemia: Secondary | ICD-10-CM

## 2016-05-12 DIAGNOSIS — Z79899 Other long term (current) drug therapy: Secondary | ICD-10-CM | POA: Diagnosis not present

## 2016-05-17 ENCOUNTER — Encounter (HOSPITAL_COMMUNITY): Payer: Medicare Other

## 2016-05-17 ENCOUNTER — Encounter (HOSPITAL_COMMUNITY): Payer: Self-pay

## 2016-05-17 ENCOUNTER — Encounter (HOSPITAL_COMMUNITY): Payer: Medicare Other | Attending: Oncology | Admitting: Oncology

## 2016-05-17 ENCOUNTER — Encounter (HOSPITAL_BASED_OUTPATIENT_CLINIC_OR_DEPARTMENT_OTHER): Payer: Medicare Other

## 2016-05-17 VITALS — BP 129/73 | HR 89 | Temp 98.0°F | Resp 18 | Wt 142.2 lb

## 2016-05-17 VITALS — BP 160/88 | HR 49 | Temp 99.2°F | Resp 16

## 2016-05-17 DIAGNOSIS — Z86718 Personal history of other venous thrombosis and embolism: Secondary | ICD-10-CM | POA: Insufficient documentation

## 2016-05-17 DIAGNOSIS — E538 Deficiency of other specified B group vitamins: Secondary | ICD-10-CM | POA: Diagnosis not present

## 2016-05-17 DIAGNOSIS — D801 Nonfamilial hypogammaglobulinemia: Secondary | ICD-10-CM | POA: Insufficient documentation

## 2016-05-17 DIAGNOSIS — C8330 Diffuse large B-cell lymphoma, unspecified site: Secondary | ICD-10-CM | POA: Diagnosis not present

## 2016-05-17 DIAGNOSIS — J309 Allergic rhinitis, unspecified: Secondary | ICD-10-CM | POA: Insufficient documentation

## 2016-05-17 DIAGNOSIS — E785 Hyperlipidemia, unspecified: Secondary | ICD-10-CM | POA: Insufficient documentation

## 2016-05-17 DIAGNOSIS — Z7901 Long term (current) use of anticoagulants: Secondary | ICD-10-CM | POA: Diagnosis not present

## 2016-05-17 DIAGNOSIS — G47419 Narcolepsy without cataplexy: Secondary | ICD-10-CM | POA: Diagnosis not present

## 2016-05-17 DIAGNOSIS — Z8572 Personal history of non-Hodgkin lymphomas: Secondary | ICD-10-CM

## 2016-05-17 DIAGNOSIS — I129 Hypertensive chronic kidney disease with stage 1 through stage 4 chronic kidney disease, or unspecified chronic kidney disease: Secondary | ICD-10-CM | POA: Diagnosis not present

## 2016-05-17 DIAGNOSIS — J45909 Unspecified asthma, uncomplicated: Secondary | ICD-10-CM | POA: Diagnosis not present

## 2016-05-17 DIAGNOSIS — Z85828 Personal history of other malignant neoplasm of skin: Secondary | ICD-10-CM | POA: Diagnosis not present

## 2016-05-17 DIAGNOSIS — E1122 Type 2 diabetes mellitus with diabetic chronic kidney disease: Secondary | ICD-10-CM | POA: Insufficient documentation

## 2016-05-17 DIAGNOSIS — G4733 Obstructive sleep apnea (adult) (pediatric): Secondary | ICD-10-CM | POA: Insufficient documentation

## 2016-05-17 DIAGNOSIS — Z86010 Personal history of colonic polyps: Secondary | ICD-10-CM | POA: Diagnosis not present

## 2016-05-17 DIAGNOSIS — I871 Compression of vein: Secondary | ICD-10-CM

## 2016-05-17 DIAGNOSIS — N189 Chronic kidney disease, unspecified: Secondary | ICD-10-CM | POA: Insufficient documentation

## 2016-05-17 DIAGNOSIS — M810 Age-related osteoporosis without current pathological fracture: Secondary | ICD-10-CM | POA: Diagnosis not present

## 2016-05-17 DIAGNOSIS — D649 Anemia, unspecified: Secondary | ICD-10-CM | POA: Diagnosis not present

## 2016-05-17 DIAGNOSIS — J449 Chronic obstructive pulmonary disease, unspecified: Secondary | ICD-10-CM | POA: Diagnosis not present

## 2016-05-17 DIAGNOSIS — K219 Gastro-esophageal reflux disease without esophagitis: Secondary | ICD-10-CM | POA: Diagnosis not present

## 2016-05-17 LAB — CBC WITH DIFFERENTIAL/PLATELET
BASOS PCT: 0 %
Basophils Absolute: 0 10*3/uL (ref 0.0–0.1)
EOS ABS: 0.2 10*3/uL (ref 0.0–0.7)
EOS PCT: 2 %
HCT: 42.1 % (ref 36.0–46.0)
HEMOGLOBIN: 13.7 g/dL (ref 12.0–15.0)
Lymphocytes Relative: 6 %
Lymphs Abs: 0.6 10*3/uL — ABNORMAL LOW (ref 0.7–4.0)
MCH: 31.2 pg (ref 26.0–34.0)
MCHC: 32.5 g/dL (ref 30.0–36.0)
MCV: 95.9 fL (ref 78.0–100.0)
MONOS PCT: 5 %
Monocytes Absolute: 0.5 10*3/uL (ref 0.1–1.0)
NEUTROS PCT: 87 %
Neutro Abs: 8.9 10*3/uL — ABNORMAL HIGH (ref 1.7–7.7)
PLATELETS: 127 10*3/uL — AB (ref 150–400)
RBC: 4.39 MIL/uL (ref 3.87–5.11)
RDW: 15.1 % (ref 11.5–15.5)
WBC: 10.2 10*3/uL (ref 4.0–10.5)

## 2016-05-17 LAB — COMPREHENSIVE METABOLIC PANEL
ALBUMIN: 3.4 g/dL — AB (ref 3.5–5.0)
ALK PHOS: 77 U/L (ref 38–126)
ALT: 33 U/L (ref 14–54)
ANION GAP: 9 (ref 5–15)
AST: 39 U/L (ref 15–41)
BUN: 17 mg/dL (ref 6–20)
CALCIUM: 8.7 mg/dL — AB (ref 8.9–10.3)
CHLORIDE: 98 mmol/L — AB (ref 101–111)
CO2: 28 mmol/L (ref 22–32)
Creatinine, Ser: 0.99 mg/dL (ref 0.44–1.00)
GFR calc non Af Amer: >60 mL/min (ref >60)
GLUCOSE: 138 mg/dL — AB (ref 65–99)
POTASSIUM: 3.6 mmol/L (ref 3.5–5.1)
SODIUM: 135 mmol/L (ref 135–145)
Total Bilirubin: 0.6 mg/dL (ref 0.3–1.2)
Total Protein: 6.9 g/dL (ref 6.5–8.1)

## 2016-05-17 LAB — PROTIME-INR
INR: 1.43
PROTHROMBIN TIME: 17.5 s — AB (ref 11.4–15.2)

## 2016-05-17 LAB — LACTATE DEHYDROGENASE: LDH: 193 U/L — ABNORMAL HIGH (ref 98–192)

## 2016-05-17 MED ORDER — DEXTROSE 5 % IV SOLN
INTRAVENOUS | Status: DC
  Administered 2016-05-17: 11:00:00 via INTRAVENOUS

## 2016-05-17 MED ORDER — IMMUNE GLOBULIN (HUMAN) 10 GM/100ML IV SOLN
50.0000 g | Freq: Once | INTRAVENOUS | Status: AC
  Administered 2016-05-17: 50 g via INTRAVENOUS
  Filled 2016-05-17: qty 400

## 2016-05-17 NOTE — Patient Instructions (Signed)
[facility] at [facility] Discharge Instructions  RECOMMENDATIONS MADE BY THE CONSULTANT AND ANY TEST RESULTS WILL BE SENT TO YOUR REFERRING PHYSICIAN.  IVIG given today Follow up as scheduled.  Thank you for choosing [facility] at [facility] to provide your oncology and hematology care.  To afford each patient quality time with our provider, please arrive at least 15 minutes before your scheduled appointment time.    If you have a lab appointment with the Cancer Center please come in thru the  Main Entrance and check in at the main information desk  You need to re-schedule your appointment should you arrive 10 or more minutes late.  We strive to give you quality time with our providers, and arriving late affects you and other patients whose appointments are after yours.  Also, if you no show three or more times for appointments you may be dismissed from the clinic at the providers discretion.     Again, thank you for choosing  Cancer Center.  Our hope is that these requests will decrease the amount of time that you wait before being seen by our physicians.       _____________________________________________________________  Should you have questions after your visit to  Cancer Center, please contact our office at [phone] between the hours of 8:30 a.m. and 4:30 p.m.  Voicemails left after 4:30 p.m. will not be returned until the following business day.  For prescription refill requests, have your pharmacy contact our office.       Resources For Cancer Patients and their Caregivers ? American Cancer Society: Can assist with transportation, wigs, general needs, runs Look Good Feel Better.        1-888-227-6333 ? Cancer Care: Provides financial assistance, online support groups, medication/co-pay assistance.  1-800-813-HOPE (4673) ? [facility] Assists [place] cancer patients and their  families through emotional , educational and financial support.  336-427-4357 ? [place] DSS Where to apply for food stamps, Medicaid and utility assistance. 336-342-1394 ? [facility]: Transportation to medical appointments. 336-347-2287 ? Social Security Administration: May apply for disability if have a Stage IV cancer. 336-342-7796 1-800-772-1213 ? [place] Aging, Disability and Transit Services: Assists with nutrition, care and transit needs. 336-349-2343  Cancer Center Support Programs: @10RELATIVEDAYS@ > Cancer Support Group  2nd Tuesday of the month 1pm-2pm, Journey Room  > Creative Journey  3rd Tuesday of the month 1130am-1pm, Journey Room  > Look Good Feel Better  1st Wednesday of the month 10am-12 noon, Journey Room (Call American Cancer Society to register 1-800-395-5775)   

## 2016-05-17 NOTE — Progress Notes (Signed)
Patient took her own tylenol and benadryl at home.  IVIG given today per orders. Patient tolerated it well. Vitals stable and discharged home from clinic ambulatory . Follow up as scheduled.

## 2016-05-17 NOTE — Patient Instructions (Addendum)
[place] at [facility]  Discharge Instructions:  You were seen by Dr. Talbert Cage today.  Return in 3 months with labs on this visit.  _______________________________________________________________  Thank you for choosing [place] at [facility] to provide your oncology and hematology care.  To afford each patient quality time with our providers, please arrive at least 15 minutes before your scheduled appointment.  You need to re-schedule your appointment if you arrive 10 or more minutes late.  We strive to give you quality time with our providers, and arriving late affects you and other patients whose appointments are after yours.  Also, if you no show three or more times for appointments you may be dismissed from the clinic.  Again, thank you for choosing [facility] hope is that these requests will allow you access to exceptional care and in a timely manner. _______________________________________________________________  If you have questions after your visit, please contact our office at [phone] between the hours of 8:30 a.m. and 5:00 p.m. Voicemails left after 4:30 p.m. will not be returned until the following business day. _______________________________________________________________  For prescription refill requests, have your pharmacy contact our office. _______________________________________________________________  Recommendations made by the consultant and any test results will be sent to your referring physician. _______________________________________________________________

## 2016-05-17 NOTE — Progress Notes (Signed)
[name], MD [place] [address]    Hypogammaglobulenemia/IgG deficiency with level of 414 mg/dl Maintenance Rituxan 09/30/2011 Hx of Diffuse large B-Cell Lymphoma, S/P R-CHOP x 4 cycles with intrathecal chemotherapy during cycles 3 and 4 with methotrexate and Solu-Cortef. Treatment was stopped due to Pseudomonas sepsis after cycles 2 and 4 which was very severe  CURRENT THERAPY:50 g IV IgG monthly for IgG deficiency.  INTERVAL HISTORY: [name] 61 y.o. female returns for follow up of IgG deficiency and history of DLBCL.    The patient reports she is doing well. She denies skin rash. She reports ongoing fevers and cough, which are concerning her. Denied night sweats, unexplained weight loss, allergies, or chest pain. She reports mildly breathing difficulty, which has been ongoing for years. She reports mild ULQ pain, though it is not overly concerning to the patient at this time.   MEDICAL HISTORY: Past Medical History:  Diagnosis Date  . Allergic rhinitis   . Allergy   . Anemia   . Anxiety   . Asthma   . B12 deficiency 01/18/2015  . Blood transfusion without reported diagnosis   . Cataract   . Cavitary lung disease   . Chronic kidney disease    related to cancer  . Chronic sinusitis   . Clotting disorder ([place])   . COPD (chronic obstructive pulmonary disease) ([place])   . Depression   . DVT (deep venous thrombosis) ([place]) 09/25/2014  . Endotracheally intubated   . GERD (gastroesophageal reflux disease)   . Hematuria 04/07/2016  . Hyperlipidemia   . Hypertension   . Hypogammaglobulinemia, acquired ([place]) 12/30/2009   Qualifier: Diagnosis of  By: Joya Gaskins MD, Burnett Harry   . Iron deficiency anemia 01/18/2015  . Myocardial infarction ([place]) 2011  . 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 Non Hodgkin's lymphoma ([place]) 2011  . Orofacial dyskinesia 04/22/2015  . Orofacial dyskinesia 04/22/2015  . Osteoporosis   . Peripheral neuropathy  due to chemotherapy ([place]) 05/02/2016  . Pneumonia 01/2013  . PONV (postoperative nausea and vomiting)   . Respiratory failure ([place])   . Sleep apnea   . 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     is allergic to meperidine hcl and montelukast sodium.  Ms. Heyde does not currently have medications on file.  SURGICAL HISTORY: Past Surgical History:  Procedure Laterality Date  . ABDOMINAL HYSTERECTOMY     Fibroids  . BASAL CELL CARCINOMA EXCISION  03/2011   scalp  . BREAST SURGERY Right    biopsy  . CATARACT EXTRACTION W/PHACO Left 11/17/2014   Procedure: CATARACT EXTRACTION PHACO AND INTRAOCULAR LENS PLACEMENT LEFT EYE;  Surgeon: Tonny Branch, MD;  Location: [facility];  Service: Ophthalmology;  Laterality: Left;  CDE:5.60  . CATARACT EXTRACTION W/PHACO Right 12/15/2014   Procedure: CATARACT EXTRACTION PHACO AND INTRAOCULAR LENS PLACEMENT RIGHT EYE CDE=5.16;  Surgeon: Tonny Branch, MD;  Location: [facility];  Service: Ophthalmology;  Laterality: Right;  . COLONOSCOPY N/A 11/14/2012   Procedure: COLONOSCOPY;  Surgeon: Rogene Houston, MD;  Location: [facility];  Service:  Endoscopy;  Laterality: N/A;  830  . EYE SURGERY    . NASAL SINUS SURGERY    . NECK SURGERY     x 2   . PERIPHERALLY INSERTED CENTRAL CATHETER INSERTION    . PICC Removal    . PORT-A-CATH REMOVAL    . PORTACATH PLACEMENT  7/11   Removed 6/12  .  TRACHEOSTOMY    . VESICOVAGINAL FISTULA CLOSURE W/ TAH      Outpatient Encounter Prescriptions as of 11/12/2014  Medication Sig Note  . acetaminophen (TYLENOL) 500 MG tablet Take 1,000 mg by mouth every 6 (six) hours as needed for pain or fever.    [name] Kitchen acyclovir (ZOVIRAX) 400 MG tablet TAKE ONE TABLET BY MOUTH TWICE DAILY.   [name] Kitchen albuterol (PROVENTIL) (2.5 MG/3ML) 0.083% nebulizer solution Take 2.5 mg by nebulization every 4 (four) hours as needed for wheezing or shortness of breath.   . Armodafinil (NUVIGIL) 150 MG tablet Take 150 mg by mouth daily.   . citalopram (CELEXA) 20 MG tablet Take 20 mg by mouth every morning.     . furosemide (LASIX) 20 MG tablet Take 20 mg by mouth as needed for fluid.  04/02/2014: Received from: External Pharmacy Received Sig:   . gabapentin (NEURONTIN) 300 MG capsule TAKE 1 CAPSULE BY MOUTH FOUR TIMES DAILY.   . mometasone-formoterol (DULERA) 200-5 MCG/ACT AERO Inhale 2 puffs into the lungs 2 (two) times daily.   [name] Kitchen omeprazole (PRILOSEC) 20 MG capsule Take 1 capsule (20 mg total) by mouth 2 (two) times daily before a meal.   . predniSONE (DELTASONE) 10 MG tablet Take 4 for four days 3 for four days 2 for four days then 1 daily and STAY (Patient taking differently: Take 5 mg by mouth daily. )   . simvastatin (ZOCOR) 80 MG tablet Take 80 mg by mouth at bedtime.     [name] Kitchen tiotropium (SPIRIVA) 18 MCG inhalation capsule Place 1 capsule (18 mcg total) into inhaler and inhale daily.   . VENTOLIN HFA 108 (90 BASE) MCG/ACT inhaler INHALE 2 PUFFS INTO THE LUNGS EVERY 6 HOURS AS NEEDED.   [name] Kitchen vitamin C (ASCORBIC ACID) 500 MG tablet Take 500 mg by mouth 2 (two) times daily.   [name] Kitchen warfarin (COUMADIN) 10 MG tablet Take 5 mg by mouth every evening.    . zafirlukast (ACCOLATE) 20 MG tablet TAKE 1 TABLET BY MOUTH TWICE DAILY.   . fluticasone (FLONASE) 50 MCG/ACT nasal spray Place 2 sprays into the nose 2 (two) times daily. (Patient not taking: Reported on 11/12/2014)   . ketorolac (ACULAR) 0.5 %  ophthalmic solution  11/12/2014: Received from: External Pharmacy  . levofloxacin (LEVAQUIN) 250 MG tablet Take 2 tablets on first day then one tablet daily therafter for a total of 14 days   . ofloxacin (OCUFLOX) 0.3 % ophthalmic solution  11/12/2014: Received from: External Pharmacy  . prednisoLONE acetate (PRED FORTE) 1 % ophthalmic suspension  11/12/2014: Received from: External Pharmacy   No facility-administered encounter medications on file as of 11/12/2014.    SOCIAL HISTORY: Social History   Social History  . Marital status: Divorced    Spouse name: N/A  . Number of children: 3  . Years of education: 12   Occupational History  . teller     First Reader History Main Topics  . Smoking status: Never Smoker  . Smokeless tobacco: Never Used  . Alcohol use No  . Drug use: No  . Sexual activity: No  Comment: hyst   Other Topics Concern  . Not on file   Social History Narrative   Originally from [place]. Always lived in [place]. Prior travel to [place]. 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 She has 1 dog currently. She has 1 conures (small parrots). No mold exposure in her home. 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 No hot tub exposure. She enjoys sewing & quilting.     FAMILY HISTORY: Family History  Problem Relation Age of Onset  . Emphysema Mother   . Stroke Mother   . COPD Mother   . Heart disease Mother        died in sleep  52  . Allergies Father   . Asthma Father        as a child  . Arthritis Father   . Parkinson's disease Father 87  . Leukemia Maternal Grandmother   . Cancer Maternal Grandmother        Leukemia  . Diabetes Brother   . Heart attack Brother   . Hypertension Brother   . Heart disease Brother 40       stents  . Hypertension Sister   . Hypertension Brother     Review of Systems  Constitutional: Negative.  Negative for diaphoresis, fever and weight loss.        No recent infections  HENT: Negative.   Eyes: Negative.   Respiratory: Positive for shortness of breath (from asthma).   Cardiovascular: Negative.  Negative for chest pain.  Gastrointestinal: Positive for abdominal pain (mild). Negative for diarrhea, nausea and vomiting.  Genitourinary: Negative.   Musculoskeletal: Negative.   Skin: Negative.  Negative for rash.  Neurological: Negative.   Endo/Heme/Allergies: Negative.   Psychiatric/Behavioral: Negative.   All other systems reviewed and are negative.   14 point review of systems was performed and is negative except as detailed under history of present illness and above  PHYSICAL EXAMINATION  ECOG PERFORMANCE STATUS: 0 - Asymptomatic  Vitals with BMI 05/17/2016  Height   Weight 142 lbs 3 oz  BMI   Systolic 235  Diastolic 73  Pulse 89  Respirations 18   Physical Exam  Constitutional: She is oriented to person, place, and time and well-developed, well-nourished, and in no distress.  HENT:  Head: Normocephalic and atraumatic.  Eyes: EOM are normal. Pupils are equal, round, and reactive to light.  Neck: Normal range of motion. Neck supple.  Cardiovascular: Normal rate, regular rhythm and normal heart sounds.  Exam reveals no gallop and no friction rub.   No murmur heard. Pulmonary/Chest: Effort normal. No respiratory distress. She has wheezes (Mild; has not used inhaler today.). She has no rales.  Abdominal: Soft. Bowel sounds are normal. She exhibits no distension. There is no tenderness. There is no rebound and no guarding.  Musculoskeletal: Normal range of motion.  Neurological: She is alert and oriented to person, place, and time. Gait normal.  Skin: Skin is warm and dry.  Nursing note and vitals reviewed.  LABORATORY DATA: I have reviewed the data as listed.  CBC    Component Value Date/Time   WBC 9.9 04/14/2016 0752   RBC 3.77 (L) 04/14/2016 0752   HGB 11.3 (L) 04/14/2016 0752   HCT 35.7 (L) 04/14/2016 0752    PLT 219 04/14/2016 0752   MCV 94.7 04/14/2016 0752   MCH 30.0 04/14/2016 0752   MCHC 31.7 04/14/2016 0752  RDW 14.5 04/14/2016 0752   LYMPHSABS 1.3 04/14/2016 0752   MONOABS 0.7 04/14/2016 0752   EOSABS 0.2 04/14/2016 0752   BASOSABS 0.0 04/14/2016 0752   CMP     Component Value Date/Time   NA 138 04/14/2016 0752   K 3.4 (L) 04/14/2016 0752   CL 100 (L) 04/14/2016 0752   CO2 29 04/14/2016 0752   GLUCOSE 101 (H) 04/14/2016 0752   BUN 12 04/14/2016 0752   CREATININE 0.80 04/14/2016 0752   CALCIUM 8.8 (L) 04/14/2016 0752   PROT 7.1 04/14/2016 0752   ALBUMIN 3.0 (L) 04/14/2016 0752   AST 17 04/14/2016 0752   ALT 13 (L) 04/14/2016 0752   ALKPHOS 110 04/14/2016 0752   BILITOT 0.4 04/14/2016 0752   GFRNONAA >60 04/14/2016 0752   GFRAA >60 04/14/2016 0752     RADIOLOGY:  Mammogram Screening Bilateral 02/26/2015 IMPRESSION: No mammographic evidence of malignancy. A result letter of this screening mammogram will be mailed directly to the patient.  ASSESSMENT and THERAPY PLAN:   1.Hypogammaglobulinemia Asthma  She will continue with IVIG.  I will see her back in 3 months. She continues to follow with pulmonary. Encouraged her to use her inhaler.  2. Diffuse large B-cell lymphoma  She remains without evidence of obvious recurrence. We will continue ongoing observation. I previously advised her that her disease was in 2011 and at this point she is unlikely to have a recurrence.   NCCN guidelines recommends the follow surveillance for Stage I, II for those who asertain a complete response in the first-line treatment setting (3.2017):  A. H&P every 3-6 months for 5 years, then yearly or as clinically indicated.  B. Labs every 3-6 months for 5 years and then annually or as clinically indicated.  C. Imaging only as indicated. For those ascertaining a complete response in the Stage III, IV setting in first-line treatment setting:  A. H&P every 3-6 months for 5 years, then yearly  or as clinically indicated.  B. Labs every 3-6 months for 5 years and then annually or as clinically indicated.  C. CT C/A/P with contrast no more than every 6 months for 2 years after  completion of treatment; then only as indicated.   Anemia with h/o iron deficiency- improved Colonoscopy in 2014 with multiple polyps Ongoing coumadin use  PLAN: Clinically doing well. No evidence of recurrence of her DLBCL. Check LDH today.  Continue IVIG for IgG deficiency. Repeat labs with CBC, CMP, LDH, immunoglobulins at next visit.   I advised the patient to follow up with GI if her abdominal discomfort worsens.  Return to clinic in 3 months for follow up.    All questions were answered. The patient knows to call the clinic with any problems, questions or concerns. We can certainly see the patient much sooner if necessary.   This document serves as a record of services personally performed by Twana First, MD. It was created on her behalf by Maryla Morrow, a trained medical scribe. The creation of this record is based on the scribe's personal observations and the provider's statements to them. This document has been checked and approved by the attending provider.  I have reviewed the above documentation for accuracy and completeness, and I agree with the above.  This note was signed electronically by:  Twana First, MD 05/17/16

## 2016-05-18 LAB — IGG, IGA, IGM
IGM, SERUM: 122 mg/dL (ref 26–217)
IgA: 13 mg/dL — ABNORMAL LOW (ref 87–352)
IgG (Immunoglobin G), Serum: 1033 mg/dL (ref 700–1600)

## 2016-05-20 ENCOUNTER — Encounter: Payer: Self-pay | Admitting: Pulmonary Disease

## 2016-05-20 ENCOUNTER — Ambulatory Visit (INDEPENDENT_AMBULATORY_CARE_PROVIDER_SITE_OTHER): Payer: Medicare Other | Admitting: Pulmonary Disease

## 2016-05-20 ENCOUNTER — Ambulatory Visit (INDEPENDENT_AMBULATORY_CARE_PROVIDER_SITE_OTHER)
Admission: RE | Admit: 2016-05-20 | Discharge: 2016-05-20 | Disposition: A | Discharge status: Home / Self Care | Payer: Medicare Other | Source: Ambulatory Visit | Attending: Pulmonary Disease | Admitting: Pulmonary Disease

## 2016-05-20 VITALS — BP 140/98 | HR 82 | Ht 63.0 in | Wt 143.8 lb

## 2016-05-20 DIAGNOSIS — J328 Other chronic sinusitis: Secondary | ICD-10-CM | POA: Diagnosis not present

## 2016-05-20 DIAGNOSIS — R059 Cough, unspecified: Secondary | ICD-10-CM

## 2016-05-20 DIAGNOSIS — J4540 Moderate persistent asthma, uncomplicated: Secondary | ICD-10-CM

## 2016-05-20 DIAGNOSIS — R05 Cough: Secondary | ICD-10-CM

## 2016-05-20 DIAGNOSIS — K219 Gastro-esophageal reflux disease without esophagitis: Secondary | ICD-10-CM | POA: Diagnosis not present

## 2016-05-20 MED ORDER — AZITHROMYCIN 250 MG PO TABS: ORAL_TABLET | ORAL | 5 refills | Status: DC

## 2016-05-20 MED ORDER — PREDNISONE 5 MG PO TABS: 5.0000 mg | ORAL_TABLET | Freq: Every day | ORAL | 6 refills | Status: DC

## 2016-05-20 MED ORDER — ZAFIRLUKAST 20 MG PO TABS: 20.0000 mg | ORAL_TABLET | Freq: Two times a day (BID) | ORAL | 11 refills | Status: DC

## 2016-05-20 NOTE — Addendum Note (Signed)
Addended by: Tyson Dense on: 05/20/2016 10:04 AM   Modules accepted: Orders

## 2016-05-20 NOTE — Patient Instructions (Addendum)
   Call me if you have any new breathing problems or feel your cough/wheezing are getting worse.  Let the Coumadin clinic know that you are starting an antibiotic because that can make you blood too thin & make you bleed.  TESTS ORDERED: 1. CXR PA/LAT today 2. Sputum Culture AFB/Fungus/Bacteria

## 2016-05-20 NOTE — Progress Notes (Signed)
Subjective:    Patient ID: [name], female    DOB: [birth year], 61 y.o.   MRN: [id number]  C.C.:  Follow-up for Moderate, Persistent Asthma w/ Severe Fixed Airway Obstruction, Chronic Sinusitis, Chronic Steroid Use, OSA/Narcolepsy, & DVT w/ SVC Syndrome.  HPI Moderate, persistent asthma with severe fixed airway obstruction: Previously prescribed Dulera, Spiriva, and zafirlukast. No prior symptomatic benefit with Xolair treatment. She reports she has not had her Zafirlukast for 1 month. She reports her cough is persistent & producing a yellow mucus. She is also having intermittent wheezing.   Chronic sinusitis: Currently on prednisone, Zestril, and Flonase. No sinus pressure, congestion or drainage.   GERD: Controlled with Prilosec. No reflux, dyspepsia, or morning brash water taste.   Chronic steroid use: Patient has been on prednisone 5 mg daily for years. Known osteoporosis on DEXA. Instructed to discuss with primary care physician at last appointment  OSA/narcolepsy: Managed by outside physician. Patient currently on treatment with CPAP, Nuvigil, and Adderall.  DVT & SVC syndrome: Secondary to clot around her Port-A-Cath in 2012. Followed by hematology and on chronic anticoagulation with Coumadin. No melena, hematochezia, melena or abnormal bleeding.   Review of Systems She reports a "low-grade" fever for the the last 2 months. She reports she does have some mild sweats and chills. The maximum temperature has been 101F. No chest pain, pressure, or tightness. No abdominal pain, nausea or emesis.   Allergies  Allergen Reactions  . Meperidine Hcl Anaphylaxis  . Montelukast Sodium Hives and Rash   Current Outpatient Prescriptions on File Prior to Visit  Medication Sig Dispense Refill  . acetaminophen (TYLENOL) 500 MG tablet Take 1,000 mg by mouth every 6 (six) hours as needed for pain or fever.     [name] Kitchen acyclovir (ZOVIRAX) 400 MG tablet TAKE ONE TABLET BY MOUTH TWICE DAILY. 60 tablet  2  . albuterol (PROAIR HFA) 108 (90 Base) MCG/ACT inhaler Inhale 2 puffs into the lungs every 6 (six) hours as needed. 8.5 g 2  . albuterol (PROVENTIL) (2.5 MG/3ML) 0.083% nebulizer solution Take 2.5 mg by nebulization every 4 (four) hours as needed for wheezing or shortness of breath.    [name] Kitchen alendronate (FOSAMAX) 70 MG tablet Take 1 tablet (70 mg total) by mouth once a week. Take with a full glass of water on an empty stomach. 12 tablet 3  . amphetamine-dextroamphetamine (ADDERALL) 10 MG tablet Take 10 mg by mouth daily.  0  . Armodafinil (NUVIGIL) 150 MG tablet Take 150 mg by mouth daily.    . citalopram (CELEXA) 20 MG tablet Take 20 mg by mouth every morning.      . cyanocobalamin 1000 MCG tablet Take 1,000 mcg by mouth daily.    . fluticasone (FLONASE) 50 MCG/ACT nasal spray   10  . furosemide (LASIX) 20 MG tablet Take 1 tablet (20 mg total) by mouth as needed for fluid. 90 tablet 1  . gabapentin (NEURONTIN) 300 MG capsule TAKE 1 CAPSULE BY MOUTH FOUR TIMES DAILY. 120 capsule 3  . mometasone-formoterol (DULERA) 200-5 MCG/ACT AERO Inhale 2 puffs into the lungs 2 (two) times daily. 3 Inhaler 4  . omeprazole (PRILOSEC) 20 MG capsule TAKE ONE CAPSULE BY MOUTH TWICE DAILY BEFORE A MEAL. 180 capsule 1  . potassium chloride SA (K-DUR,KLOR-CON) 20 MEQ tablet Take 1 tablet (20 mEq total) by mouth 2 (two) times daily. 60 tablet 0  . predniSONE (DELTASONE) 10 MG tablet TAKE 1 TABLET BY MOUTH ONCE DAILY. (Patient taking differently: TAKE  1/2 TABLET BY MOUTH ONCE DAILY.) 60 tablet 2  . simvastatin (ZOCOR) 80 MG tablet Take 80 mg by mouth at bedtime.      [name] Kitchen tiotropium (SPIRIVA) 18 MCG inhalation capsule Place 1 capsule (18 mcg total) into inhaler and inhale daily. 30 capsule 3  . warfarin (COUMADIN) 10 MG tablet Take 5 mg by mouth every evening. 4 mg daily    . zafirlukast (ACCOLATE) 20 MG tablet TAKE 1 TABLET BY MOUTH TWICE DAILY. 60 tablet 5   Current Facility-Administered Medications on File Prior to  Visit  Medication Dose Route Frequency Provider Last Rate Last Dose  . 0.9 %  sodium chloride infusion   Intravenous Once Kefalas, Thomas S, PA-C      . dextrose 5 % solution   Intravenous Continuous Baird Cancer, PA-C   Stopped at 08/25/15 1400  . sodium chloride 0.9 % injection 10 mL  10 mL Intracatheter PRN Baird Cancer, PA-C   10 mL at 08/25/15 1125   Past Medical History:  Diagnosis Date  . Allergic rhinitis   . Allergy   . Anemia   . Anxiety   . Asthma   . B12 deficiency 01/18/2015  . Blood transfusion without reported diagnosis   . Cataract   . Cavitary lung disease   . Chronic kidney disease    related to cancer  . Chronic sinusitis   . Clotting disorder ([place])   . COPD (chronic obstructive pulmonary disease) ([place])   . Depression   . DVT (deep venous thrombosis) ([place]) 09/25/2014  . Endotracheally intubated   . GERD (gastroesophageal reflux disease)   . Hematuria 04/07/2016  . Hyperlipidemia   . Hypertension   . Hypogammaglobulinemia, acquired ([place]) 12/30/2009   Qualifier: Diagnosis of  By: Joya Gaskins MD, Burnett Harry   . Iron deficiency anemia 01/18/2015  . Myocardial infarction ([place]) 2011  . 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 Non Hodgkin's lymphoma ([place]) 2011  . Orofacial dyskinesia 04/22/2015  . Orofacial dyskinesia 04/22/2015  . Osteoporosis   . Peripheral neuropathy due to chemotherapy ([place]) 05/02/2016  . Pneumonia 01/2013  . PONV (postoperative nausea and vomiting)   . Respiratory failure ([place])   . Sleep apnea   . SVC syndrome 09/25/2014   Past Surgical History:  Procedure Laterality Date  . ABDOMINAL HYSTERECTOMY     Fibroids  . BASAL CELL CARCINOMA EXCISION  03/2011   scalp  . BREAST SURGERY Right    biopsy  . CATARACT EXTRACTION W/PHACO Left 11/17/2014   Procedure: CATARACT EXTRACTION PHACO AND INTRAOCULAR LENS PLACEMENT LEFT EYE;  Surgeon: Tonny Branch, MD;  Location: [facility];  Service: Ophthalmology;  Laterality: Left;  CDE:5.60  . CATARACT  EXTRACTION W/PHACO Right 12/15/2014   Procedure: CATARACT EXTRACTION PHACO AND INTRAOCULAR LENS PLACEMENT RIGHT EYE CDE=5.16;  Surgeon: Tonny Branch, MD;  Location: [facility];  Service: Ophthalmology;  Laterality: Right;  . COLONOSCOPY N/A 11/14/2012   Procedure: COLONOSCOPY;  Surgeon: Rogene Houston, MD;  Location: [facility] SUITE;  Service: Endoscopy;  Laterality: N/A;  830  . EYE SURGERY    . NASAL SINUS SURGERY    . NECK SURGERY     x 2   . PERIPHERALLY INSERTED CENTRAL CATHETER INSERTION    . PICC Removal    . PORT-A-CATH REMOVAL    . PORTACATH PLACEMENT  7/11   Removed 6/12  . TRACHEOSTOMY    . VESICOVAGINAL FISTULA CLOSURE W/ TAH     Family History  Problem Relation Age of  Onset  . Emphysema Mother   . Stroke Mother   . COPD Mother   . Heart disease Mother        died in sleep  43  . Allergies Father   . Asthma Father        as a child  . Arthritis Father   . Parkinson's disease Father 58  . Leukemia Maternal Grandmother   . Cancer Maternal Grandmother        Leukemia  . Diabetes Brother   . Heart attack Brother   . Hypertension Brother   . Heart disease Brother 40       stents  . Hypertension Sister   . Hypertension Brother    Social History   Social History  . Marital status: Divorced    Spouse name: N/A  . Number of children: 3  . Years of education: 12   Occupational History  . teller     [employer] [place] History Main Topics  . Smoking status: Never Smoker  . Smokeless tobacco: Never Used  . Alcohol use No  . Drug use: No  . Sexual activity: No     Comment: hyst   Other Topics Concern  . None   Social History Narrative   Originally from [place]. Always lived in [place]. Prior travel to [place]. 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 She has 1 dog currently. She has 1 conures (small parrots). No mold exposure in her home. 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 No hot tub  exposure. She enjoys sewing & quilting.       Objective:   Physical Exam BP (!) 140/98 (BP Location: Right Arm, Patient Position: Sitting, Cuff Size: Normal)   Pulse 82   Ht 5\' 3"  (1.6 m)   Wt 143 lb 12.8 oz (65.2 kg)   SpO2 95%   BMI 25.47 kg/m   Gen.: No distress. Mild central obesity. Comfortable. Integument: No rash. No bruising. Warm. HEENT: Minimal bilateral nasal turbinate swelling. Moist mucous membranes. No oral ulcers. Cardiovascular: Regular rate. Regular rhythm. No edema. Pulmonary: Coarse wheeze bilaterally. Good aeration. Intermittent cough witnessed. No more breathing on room air. Abdomen: Soft. Nondistended. Normal bowel sounds.  PFT 04/30/15: FVC 1.98 L (64%) FEV1 1.31 L (55%) FEV1/FVC 0.66 FEF 25-75 0.76 L (34%) no bronchodilator response                                       DLCO corrected 87% 10/08/14: FVC 1.68 L (54%) FEV1 1.14 L (47%) FEV1/FVC 0.67 FEF 25-75 0.75 L (33%) no bronchodilator response TLC 5.16 L (108%) ERV 11% DLCO uncorrected 69%  PSG Split Night Study (05/25/12): AHI 14.7 events/hour. ResMed Quattro Full Face. 15cm H2O. Baseline Sat 90% & Lowest Sat 75%.  IMAGING MAXILLOFACIAL CT W/O 01/30/15 (per radiologist): Fluid in both maxillary sinuses more so in the right. Layering fluid in the sphenoid sinuses. Bilateral ethmoidectomy. Findings consistent with acute on chronic sinusitis.  DEXA (01/30/15): Right femur neck T score -2.5 consistent with osteoporosis.  BARIUM SWALLOW (10/01/14): Normal esophageal motility. No mass or stricture. No hiatal hernia or reflux.  VENOUS DUPLEX LLE (06/26/14): No evidence of DVT  CT CHEST W/O 01/16/14 (previously reviewed by me): New right lower lobe reticulonodular infiltrate. Linear densities right upper lobe. Resolution of prior  left lower lobe opacity. No pathologic mediastinal adenopathy. No pleural effusion or thickening. No pericardial effusion. Minimal esophageal thickening.   CARDIAC TTE (11/02/09): LV EF  30-35% w/ diffuse hypokinesis. Normal wall thickness. RA normal in size. RV normal in size. Pulmonary artery poorly visualized. RVSP 53mmHg. No AS or AR. No MS w/ moderate MR. Tricuspid valve poorly visualized.  MICROBIOLOGY SPUTUM (04/29/10):  M. catarrahlis  TRACH ASPIRATE (11/23/09):  P. aeruginosa BAL (11/15/09):  Bacteria & AFB negative  LABS 01/21/15 Alpha-1 antitrypsin: MM (138)  09/25/14 IgE: 5 Aspergillus antigen:  0.25  08/12/14 CBC: 6.8/10.3/34.3/184 BMP: 141/3.7/102/29/16/1.15/91/8.7 LFT: 3.5/6.5/0.5/71/17/12  IgG: 858 IgA: 44 IgM: 94  INR: 2.61    Assessment & Plan:  61 y.o. female with moderate, persistent asthma with fixed airway obstruction; chronic sinusitis; GERD; chronic prednisone use; osteoporosis; OSA/narcolepsy; & DVT/SVC syndrome. Patient does have a more productive cough lately and with her B type symptoms I am worried about a possible underlying infectious process. Alternatively, as she has not been on her zafirlukast for some time this could simply be secondary to her allergic exposure. Her reflux overall seems to be CONTROLLED at this time. She is not in extremis. I am going to treat empirically with antibiotic therapy while investigating her cough further. I instructed the patient contact my office if her symptoms worsen or she had any questions or concerns.  1. Cough: Checking sputum culture for AFB, fungus, and bacteria. Chest x-ray PA/LAT today. Empiric treatment with a Z-Pak.  2. Moderate, persistent asthma with severe fixed airway obstruction: Refilling patient's appreciable casts. Continuing prednisone 5 mg daily, Spiriva, and Dulera. No changes. 3. Chronic sinusitis: Refilling patient's appreciable casts. Minimal symptoms. Continuing Zestril and Flonase. 4. GERD: Controlled with Prilosec. No changes. 5. Chronic steroid use: Refilling patient's prednisone prescription today. Underlying osteoporosis to be addressed by PCP. 6. OSA/narcolepsy:  Managed by outside physician. Continuing CPAP. 7. DVT & SVC syndrome: Currently on Coumadin. No signs of active bleeding. Managed by hematology. Instructed patient to notify Coumadin clinic on antibiotic treatment. 8. Health maintenance: Status post Prevnar vaccine August 2017, Pneumovax October 2013 & Tdap April 2015. 9. Follow-up: Return to clinic in 3 months or sooner if needed.  Sonia Baller Ashok Cordia, M.D. [facility] Pager:  [phone] After 3pm or if no response, call [phone] 9:45 AM 05/20/16

## 2016-05-23 DIAGNOSIS — E785 Hyperlipidemia, unspecified: Secondary | ICD-10-CM | POA: Diagnosis not present

## 2016-05-23 DIAGNOSIS — G4733 Obstructive sleep apnea (adult) (pediatric): Secondary | ICD-10-CM | POA: Diagnosis not present

## 2016-05-23 DIAGNOSIS — G47419 Narcolepsy without cataplexy: Secondary | ICD-10-CM | POA: Diagnosis not present

## 2016-05-23 DIAGNOSIS — M792 Neuralgia and neuritis, unspecified: Secondary | ICD-10-CM | POA: Diagnosis not present

## 2016-05-23 IMAGING — RF DG ESOPHAGUS
6 of 9 series · 14 of 24 positions shown · non-contrast
Comparison: None.

CLINICAL DATA: Gastroesophageal reflux. History of non Hodgkin
lymphoma.

EXAM:
ESOPHOGRAM / BARIUM SWALLOW / BARIUM TABLET STUDY
TECHNIQUE: Combined double contrast and single contrast examination performed
using effervescent crystals, thick barium liquid, and thin barium
liquid. The patient was observed with fluoroscopy swallowing a 13 mm
barium sulphate tablet.
FLUOROSCOPY TIME:  Radiation Exposure Index (as provided by the
fluoroscopic device):
If the device does not provide the exposure index:
Fluoroscopy Time:  1 minutes 26 second
Number of Acquired Images:

[Series 1: run · 9 of 28 slices shown (1 of 6)]
[im 1/28]
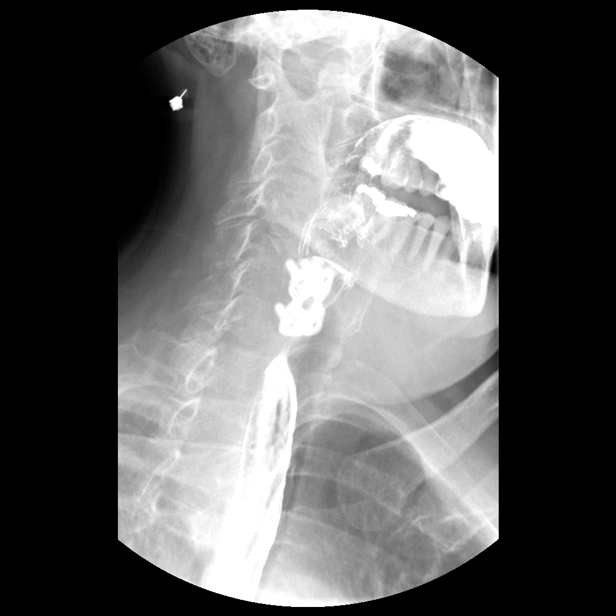
[im 4/28]
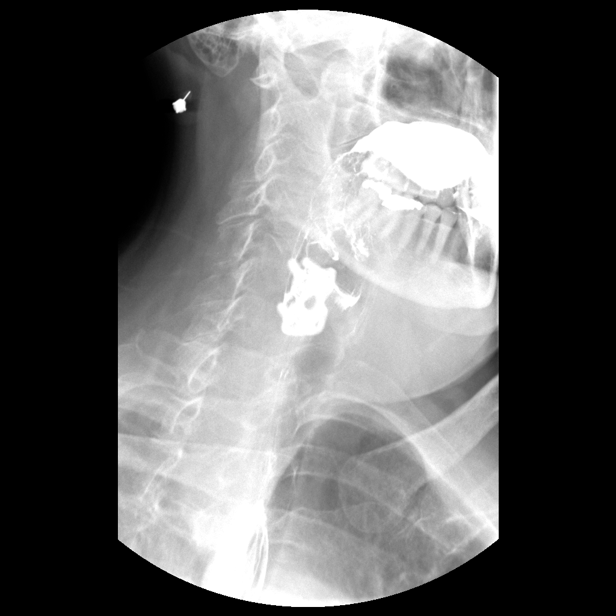
[im 8/28]
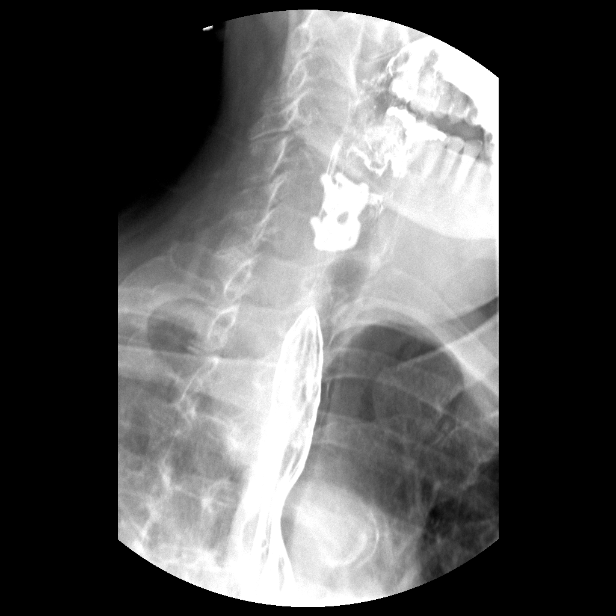
[im 11/28]
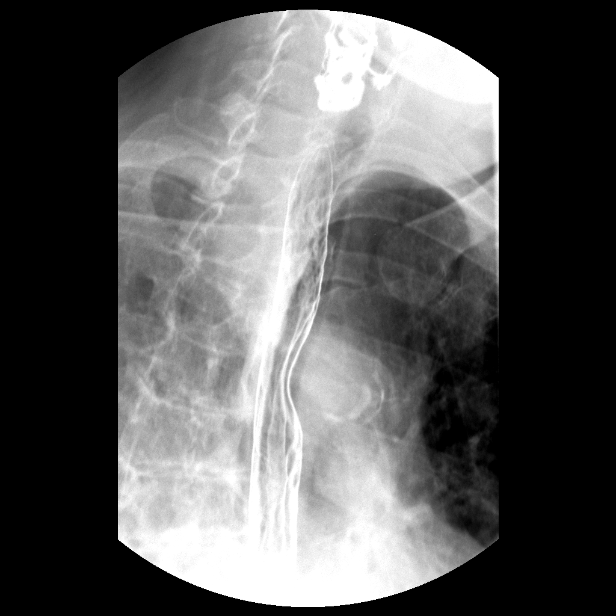
[im 13/28]
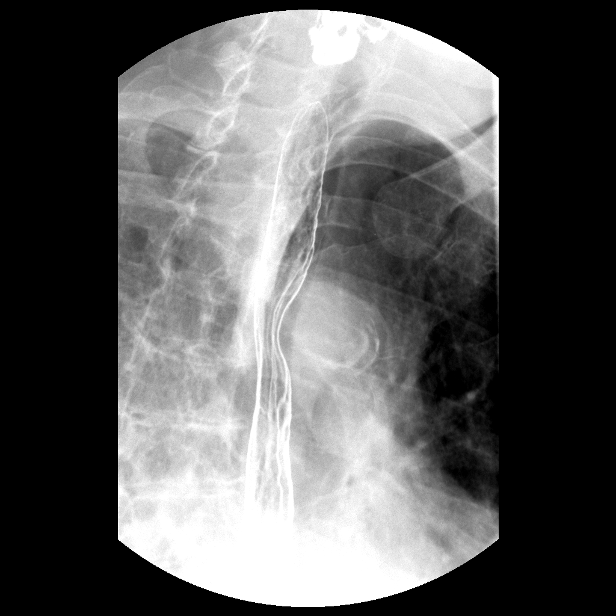
[im 17/28]
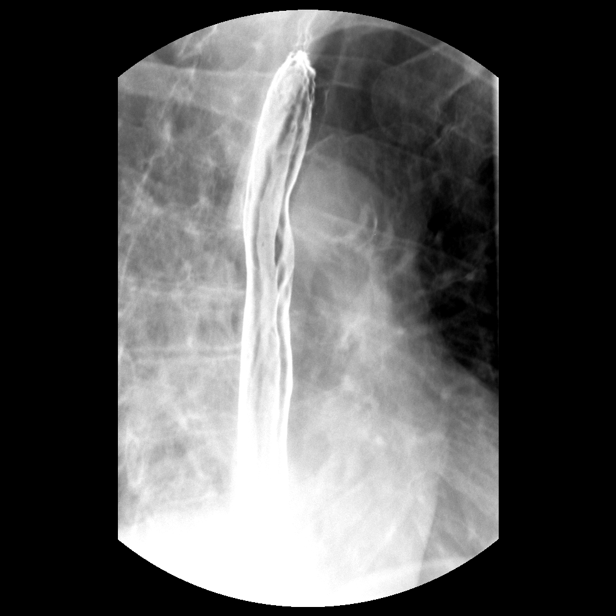
[im 20/28]
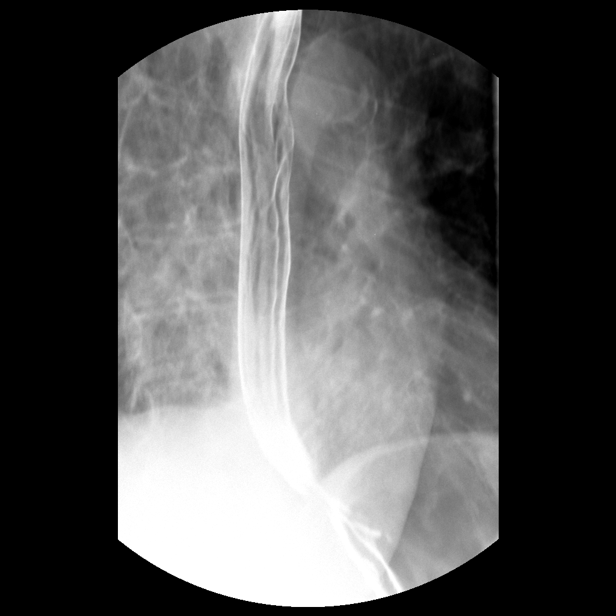
[im 22/28]
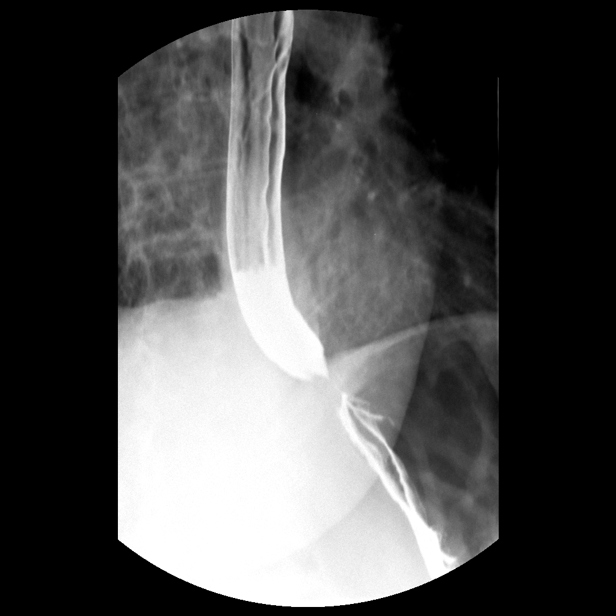
[im 26/28]
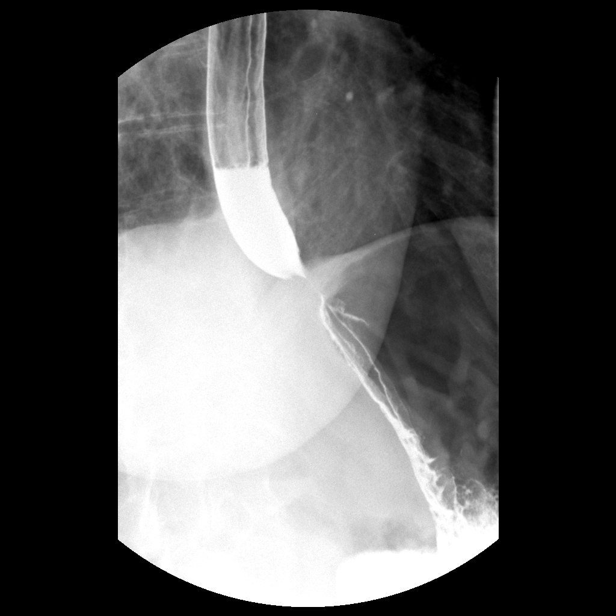

[Series 2: run · 1 of 1 slices shown (2 of 6)]
[im 1/1]
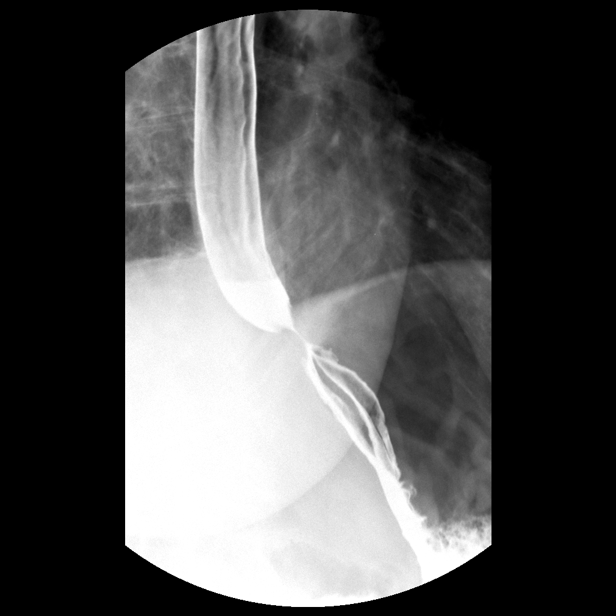

[Series 4: run · 1 of 1 slices shown (3 of 6)]
[im 1/1]
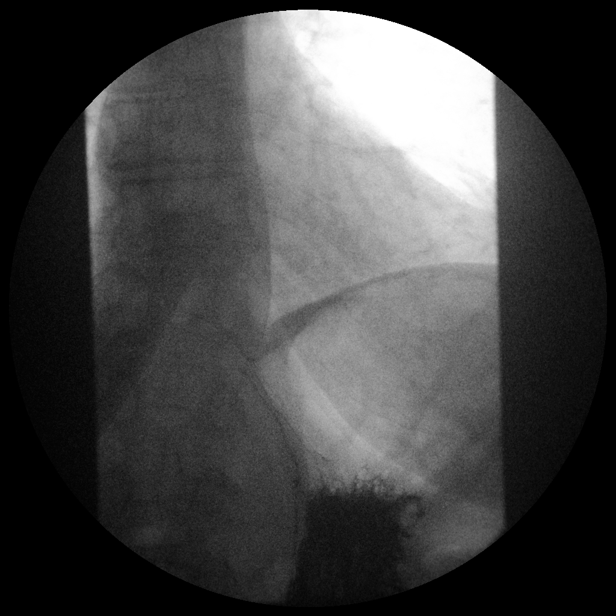

[Series 5: run · 1 of 1 slices shown (4 of 6)]
[im 1/1]
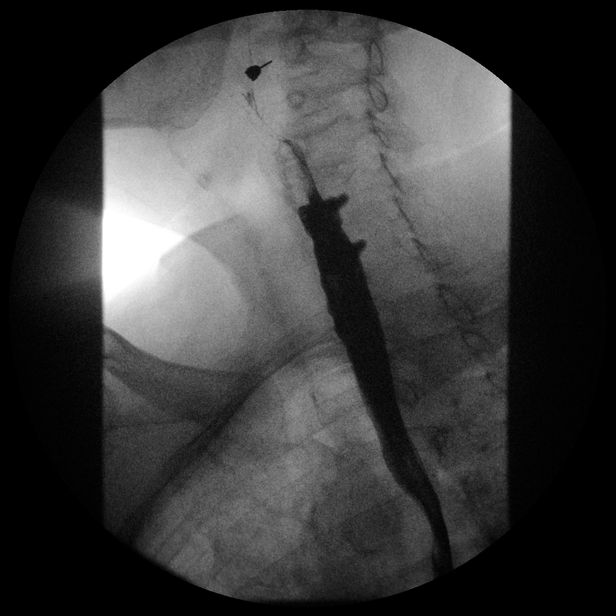

[Series 7: run · 1 of 1 slices shown (5 of 6)]
[im 1/1]
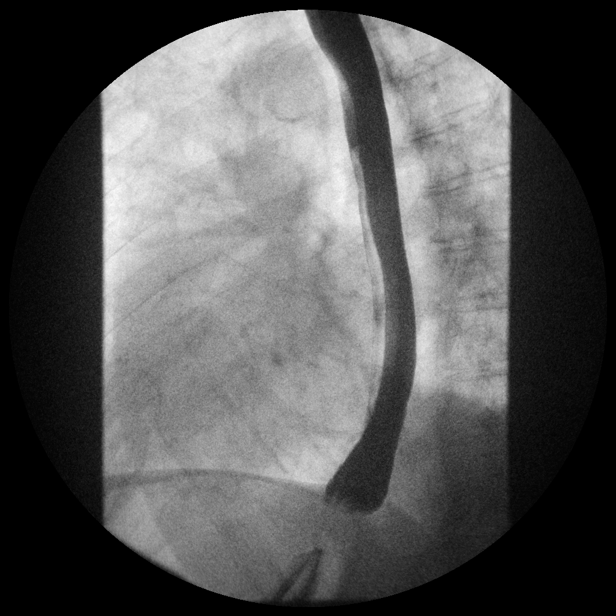

[Series 9: run · 1 of 1 slices shown (6 of 6)]
[im 1/1]
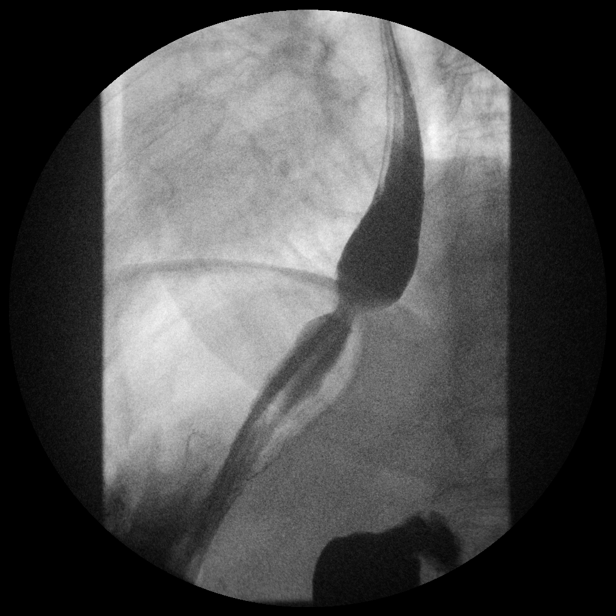

[14 of 24 positions shown; findings below may reference images not displayed]

FINDINGS: Esophageal mucosa and motility normal. No stricture or mass lesion
identified. Esophageal caliber normal. Negative for hiatal hernia or
reflux

Barium tablet passed readily into the stomach without delay.
IMPRESSION: Negative

## 2016-05-23 NOTE — Progress Notes (Signed)
Left message for patient to contact office for medical results.

## 2016-05-24 ENCOUNTER — Telehealth: Payer: Self-pay | Admitting: Pulmonary Disease

## 2016-05-24 NOTE — Telephone Encounter (Signed)
lmomtcb x 1 for the pt to make her aware that we will get this message over to [facility] for results. JN please advise. thanks

## 2016-05-24 NOTE — Progress Notes (Signed)
Left message for patient to contact office.

## 2016-05-25 NOTE — Telephone Encounter (Signed)
Notes recorded by Javier Glazier, MD on 05/20/2016 at 12:34 PM EDT Please let the patient know that I reviewed her chest x-ray and there is no evidence of pneumonia. Thank you. -------- lmtcb X1 for pt to make aware of cxr results.

## 2016-05-26 NOTE — Telephone Encounter (Signed)
Pt is aware of results and voiced her understanding. Nothing further needed.  

## 2016-05-26 NOTE — Telephone Encounter (Signed)
Patient returned phone call..ert ° °

## 2016-05-26 NOTE — Progress Notes (Signed)
Contacted patient and informed her of results. Patient did not have any questions and verbalized understanding. Nothing further is needed.

## 2016-05-26 NOTE — Telephone Encounter (Signed)
lmomtctb x 2

## 2016-06-03 IMAGING — MG MM DIGITAL SCREENING
8 series · 8 of 24 positions shown · non-contrast
Comparison: Previous exam(s).

CLINICAL DATA: Screening.

EXAM:
DIGITAL SCREENING BILATERAL MAMMOGRAM WITH 3D TOMO WITH CAD

[L CC]
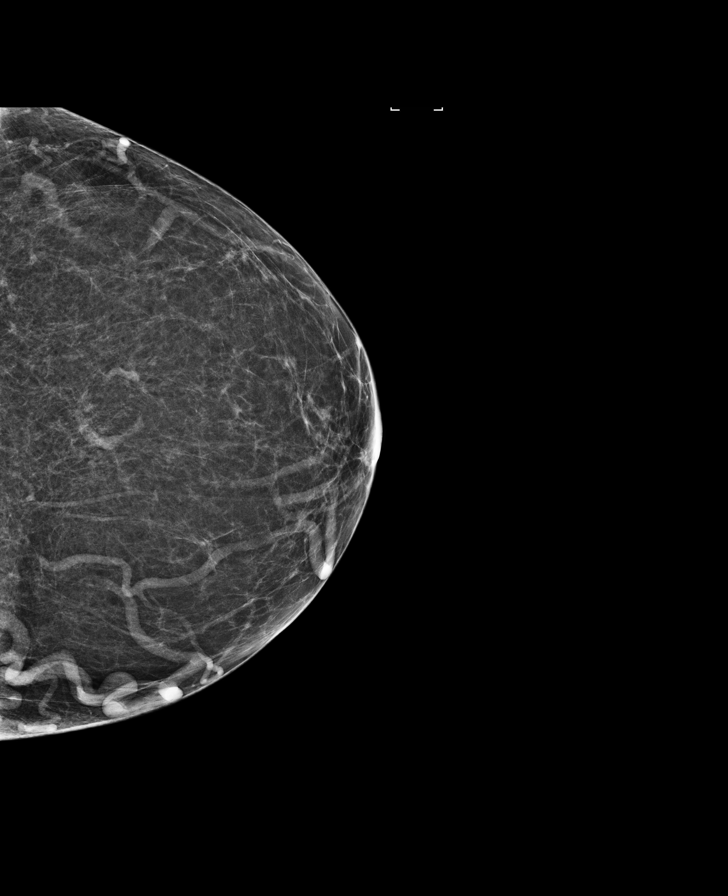

[L MLO]
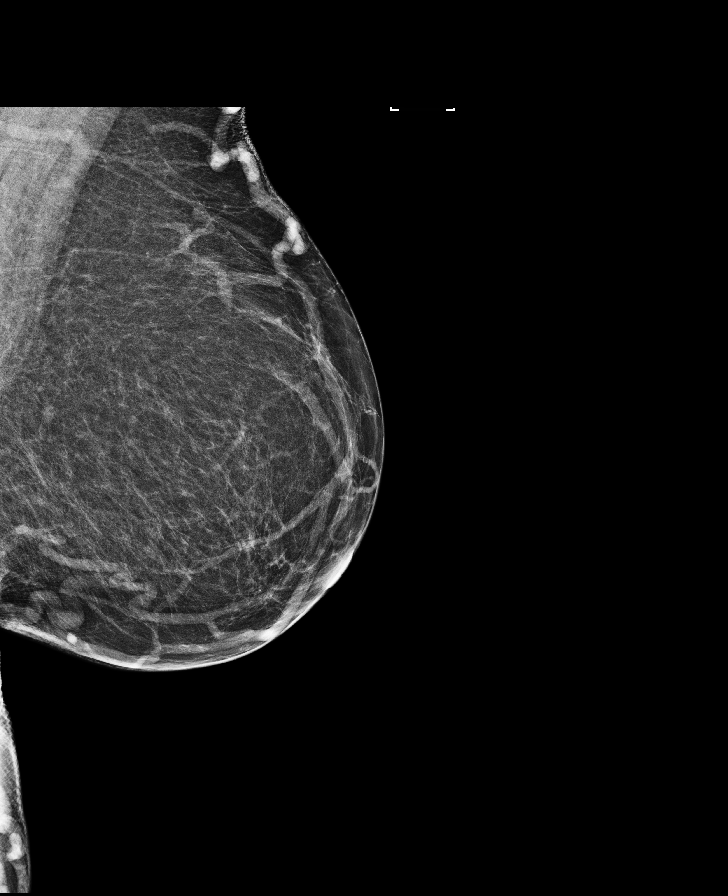

[R MLO]
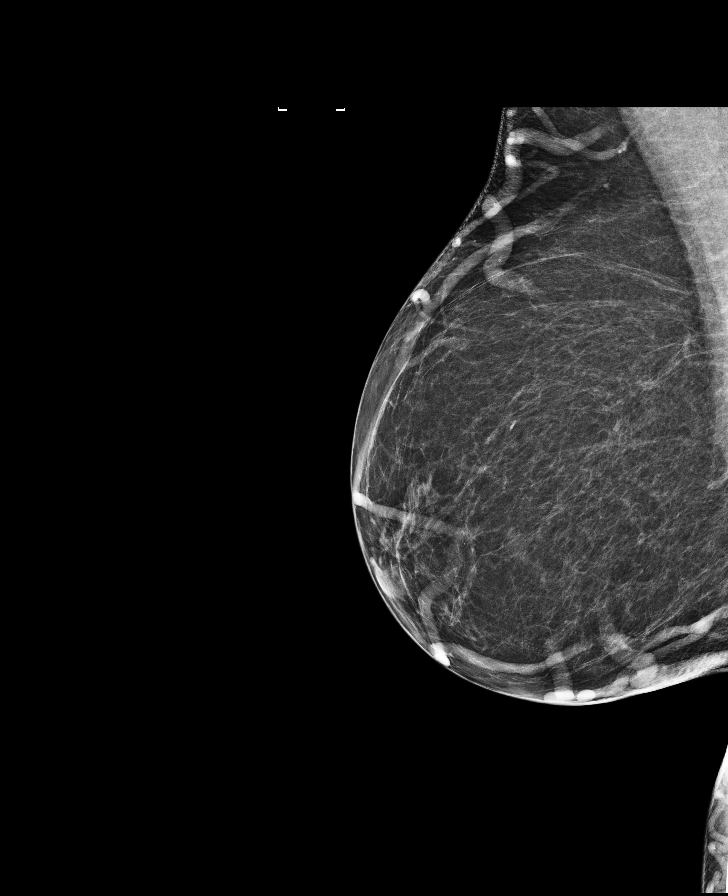

[R CC]
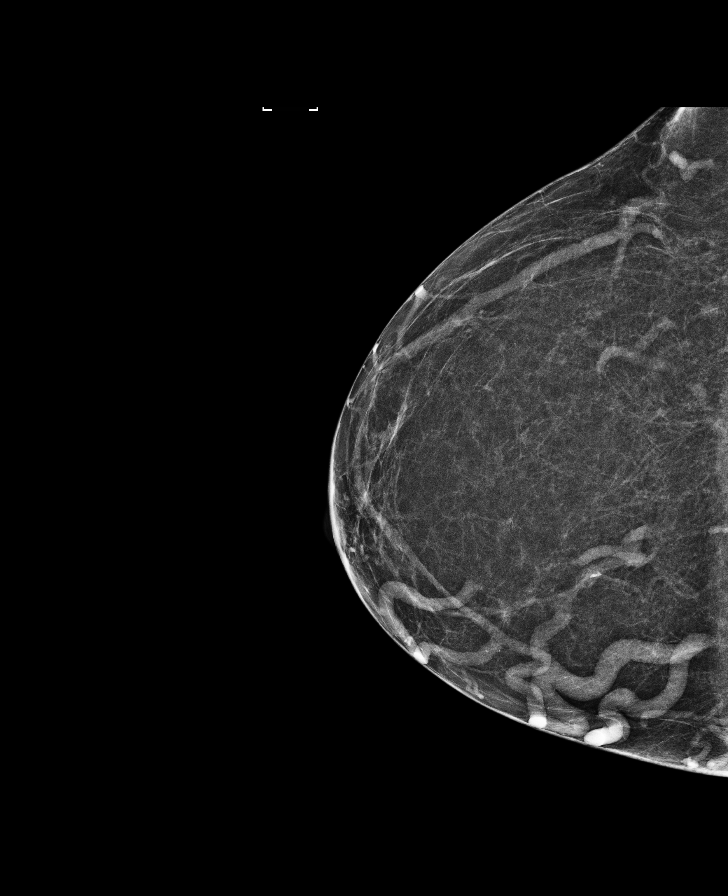

[L CC tomo · tomo slice 29/56.0]
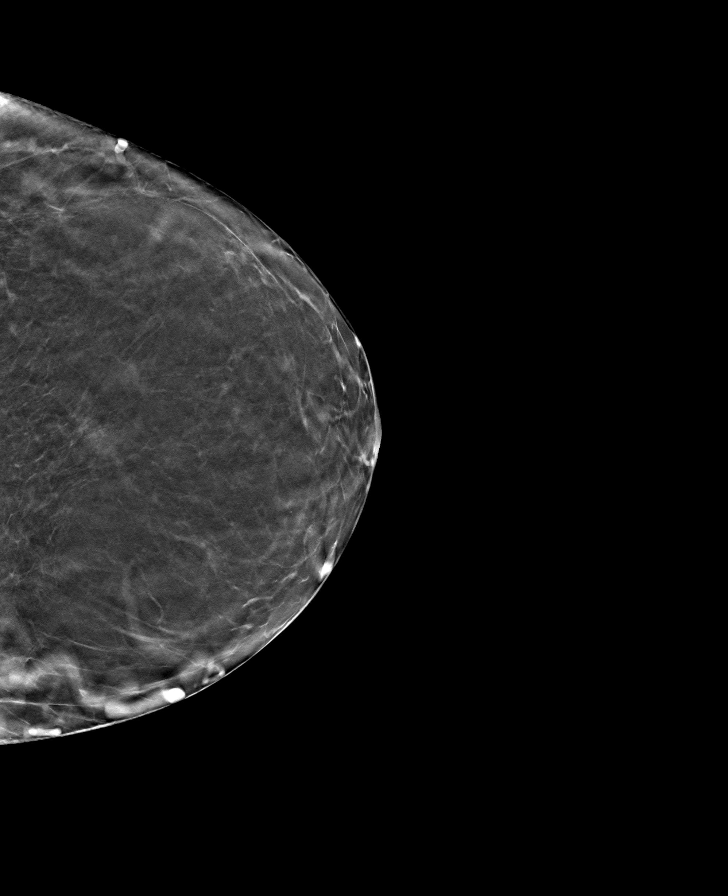

[R MLO tomo · tomo slice 32/63.0]
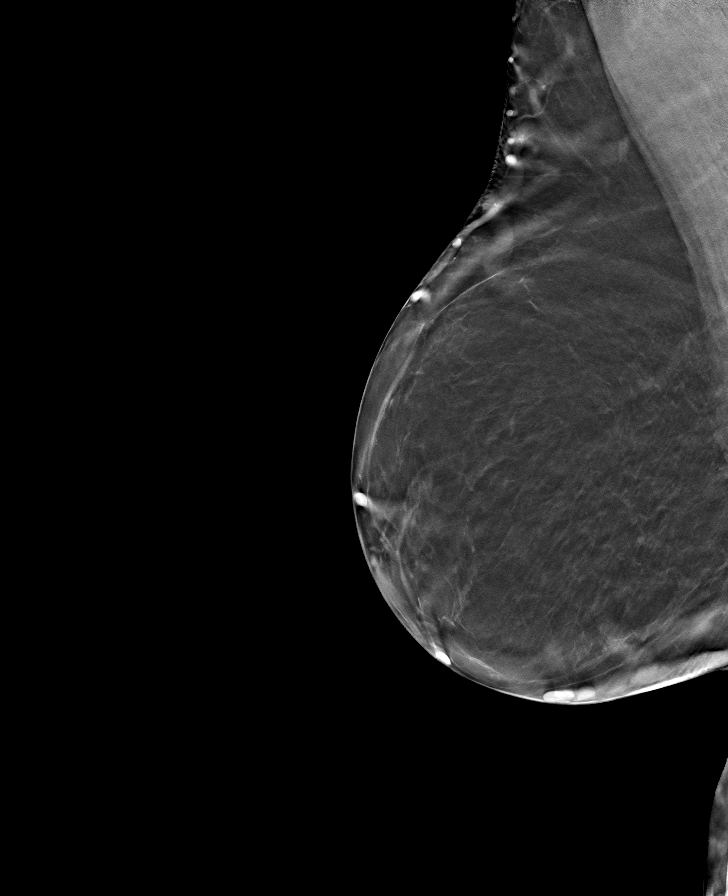

[R CC tomo · tomo slice 29/56.0]
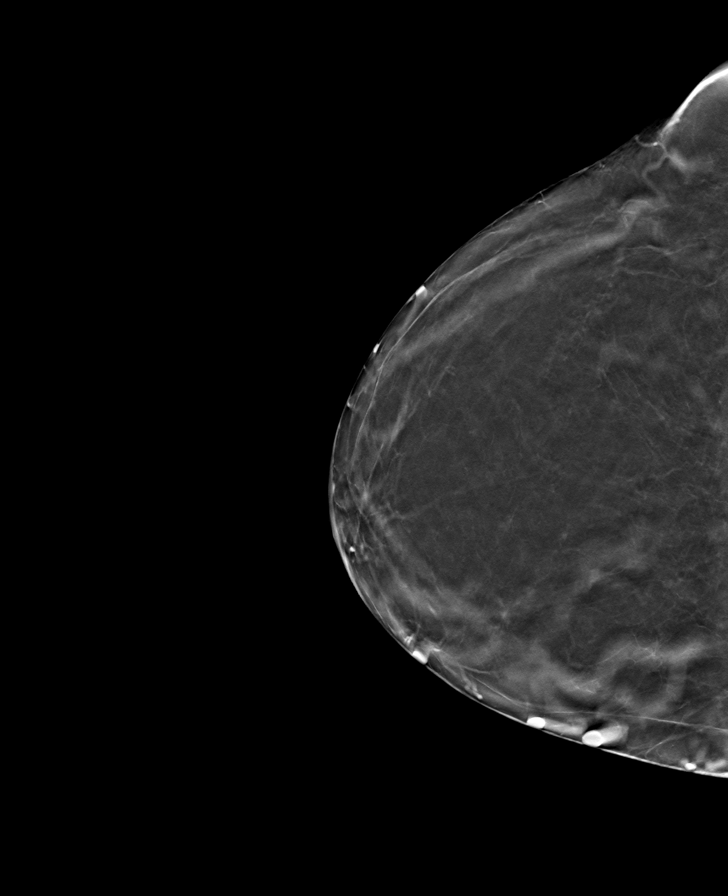

[L MLO tomo · tomo slice 34/67.0]
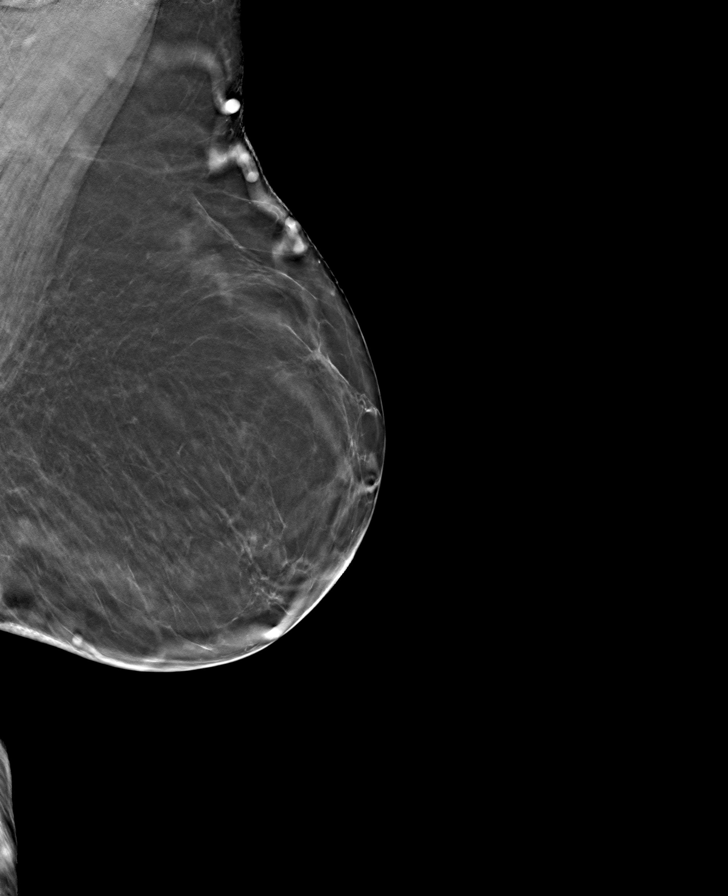

[8 of 24 positions shown; findings below may reference images not displayed]

ACR Breast Density Category b: There are scattered areas of
fibroglandular density.
FINDINGS: There are no findings suspicious for malignancy. Images were
processed with CAD.
IMPRESSION: No mammographic evidence of malignancy. A result letter of this
screening mammogram will be mailed directly to the patient.

RECOMMENDATION:
Screening mammogram in one year. (Code:55-L-23V)

BI-RADS CATEGORY  1: Negative.

## 2016-06-10 DIAGNOSIS — Z79899 Other long term (current) drug therapy: Secondary | ICD-10-CM | POA: Diagnosis not present

## 2016-06-13 ENCOUNTER — Other Ambulatory Visit (HOSPITAL_COMMUNITY): Payer: Self-pay | Admitting: *Deleted

## 2016-06-14 ENCOUNTER — Encounter (HOSPITAL_COMMUNITY): Payer: Medicare Other | Attending: Oncology

## 2016-06-14 ENCOUNTER — Encounter (HOSPITAL_BASED_OUTPATIENT_CLINIC_OR_DEPARTMENT_OTHER): Payer: Medicare Other

## 2016-06-14 ENCOUNTER — Encounter (HOSPITAL_COMMUNITY): Payer: Self-pay

## 2016-06-14 VITALS — BP 167/87 | HR 78 | Temp 98.4°F | Resp 18 | Wt 144.8 lb

## 2016-06-14 DIAGNOSIS — C8330 Diffuse large B-cell lymphoma, unspecified site: Secondary | ICD-10-CM | POA: Diagnosis not present

## 2016-06-14 DIAGNOSIS — D801 Nonfamilial hypogammaglobulinemia: Secondary | ICD-10-CM

## 2016-06-14 LAB — LACTATE DEHYDROGENASE: LDH: 205 U/L — ABNORMAL HIGH (ref 98–192)

## 2016-06-14 LAB — CBC WITH DIFFERENTIAL/PLATELET
Basophils Absolute: 0 10*3/uL (ref 0.0–0.1)
Basophils Relative: 0 %
Eosinophils Absolute: 0 10*3/uL (ref 0.0–0.7)
Eosinophils Relative: 0 %
HCT: 39.1 % (ref 36.0–46.0)
Hemoglobin: 12.5 g/dL (ref 12.0–15.0)
Lymphocytes Relative: 4 %
Lymphs Abs: 0.8 10*3/uL (ref 0.7–4.0)
MCH: 30.8 pg (ref 26.0–34.0)
MCHC: 32 g/dL (ref 30.0–36.0)
MCV: 96.3 fL (ref 78.0–100.0)
Monocytes Absolute: 1 10*3/uL (ref 0.1–1.0)
Monocytes Relative: 5 %
Neutro Abs: 16.1 10*3/uL — ABNORMAL HIGH (ref 1.7–7.7)
Neutrophils Relative %: 91 %
Platelets: 170 10*3/uL (ref 150–400)
RBC: 4.06 MIL/uL (ref 3.87–5.11)
RDW: 14.6 % (ref 11.5–15.5)
WBC: 17.9 10*3/uL — ABNORMAL HIGH (ref 4.0–10.5)

## 2016-06-14 LAB — COMPREHENSIVE METABOLIC PANEL
ALK PHOS: 83 U/L (ref 38–126)
ALT: 13 U/L — AB (ref 14–54)
AST: 20 U/L (ref 15–41)
Albumin: 3.5 g/dL (ref 3.5–5.0)
Anion gap: 11 (ref 5–15)
BILIRUBIN TOTAL: 0.6 mg/dL (ref 0.3–1.2)
BUN: 12 mg/dL (ref 6–20)
CHLORIDE: 97 mmol/L — AB (ref 101–111)
CO2: 27 mmol/L (ref 22–32)
CREATININE: 0.93 mg/dL (ref 0.44–1.00)
Calcium: 8.8 mg/dL — ABNORMAL LOW (ref 8.9–10.3)
GFR calc Af Amer: >60 mL/min (ref >60)
GFR calc non Af Amer: >60 mL/min (ref >60)
Glucose, Bld: 148 mg/dL — ABNORMAL HIGH (ref 65–99)
Potassium: 3.1 mmol/L — ABNORMAL LOW (ref 3.5–5.1)
Sodium: 135 mmol/L (ref 135–145)
TOTAL PROTEIN: 7.2 g/dL (ref 6.5–8.1)

## 2016-06-14 MED ORDER — IMMUNE GLOBULIN (HUMAN) 10 GM/100ML IV SOLN
50.0000 g | Freq: Once | INTRAVENOUS | Status: AC
Start: 2016-06-14 — End: 2016-06-14
  Administered 2016-06-14: 50 g via INTRAVENOUS
  Filled 2016-06-14: qty 100

## 2016-06-14 MED ORDER — DEXTROSE 5 % IV SOLN
INTRAVENOUS | Status: DC
  Administered 2016-06-14: 12:00:00 via INTRAVENOUS

## 2016-06-14 MED ORDER — ACETAMINOPHEN 325 MG PO TABS: 650.0000 mg | ORAL_TABLET | Freq: Four times a day (QID) | ORAL | Status: DC | PRN

## 2016-06-14 MED ORDER — DIPHENHYDRAMINE HCL 25 MG PO TABS
25.0000 mg | ORAL_TABLET | Freq: Once | ORAL | Status: DC
  Filled 2016-06-14: qty 1

## 2016-06-14 MED ORDER — SODIUM CHLORIDE 0.9 % IJ SOLN
10.0000 mL | INTRAMUSCULAR | Status: DC | PRN
  Administered 2016-06-14: 10 mL
  Filled 2016-06-14: qty 10

## 2016-06-14 MED ORDER — SODIUM CHLORIDE 0.9 % IV SOLN: Freq: Once | INTRAVENOUS | Status: DC

## 2016-06-14 NOTE — Progress Notes (Signed)
Patient tolerated infusion. No complaints or reactions. Patient discharged ambulatory from clinic in stable condition to self.

## 2016-06-14 NOTE — Patient Instructions (Signed)
[place] at [facility] Discharge Instructions  RECOMMENDATIONS MADE BY THE CONSULTANT AND ANY TEST RESULTS WILL BE SENT TO YOUR REFERRING PHYSICIAN.  You were given your IVIG today Follow up as scheduled   Thank you for choosing [place] at [facility] to provide your oncology and hematology care.  To afford each patient quality time with our provider, please arrive at least 15 minutes before your scheduled appointment time.    If you have a lab appointment with the [facility] please come in thru the  Main Entrance and check in at the main information desk  You need to re-schedule your appointment should you arrive 10 or more minutes late.  We strive to give you quality time with our providers, and arriving late affects you and other patients whose appointments are after yours.  Also, if you no show three or more times for appointments you may be dismissed from the clinic at the providers discretion.     Again, thank you for choosing [facility].  Our hope is that these requests will decrease the amount of time that you wait before being seen by our physicians.       _____________________________________________________________  Should you have questions after your visit to [facility], please contact our office at [phone] between the hours of 8:30 a.m. and 4:30 p.m.  Voicemails left after 4:30 p.m. will not be returned until the following business day.  For prescription refill requests, have your pharmacy contact our office.       Resources For Cancer Patients and their Caregivers ? American Cancer Society: Can assist with transportation, wigs, general needs, runs Look Good Feel Better.        9863652199 ? Cancer Care: Provides financial assistance, online support groups, medication/co-pay assistance.  1-800-813-HOPE 505-295-3013) ? [facility] Assists [place] cancer  patients and their families through emotional , educational and financial support.  337 449 0365 ? [place] DSS Where to apply for food stamps, Medicaid and utility assistance. 316-531-0855 ? [facility]: Transportation to medical appointments. 430-050-2898 ? Social Security Administration: May apply for disability if have a Stage IV cancer. 731-018-2544 (806)285-4288 ? [employer], Disability and Transit Services: Assists with nutrition, care and transit needs. [place] Support Programs: @10RELATIVEDAYS @ > Cancer Support Group  2nd Tuesday of the month 1pm-2pm, Journey Room  > Creative Journey  3rd Tuesday of the month 1130am-1pm, Journey Room  > Look Good Feel Better  1st Wednesday of the month 10am-12 noon, Journey Room (Call [place] to register [phone])

## 2016-06-15 ENCOUNTER — Other Ambulatory Visit (HOSPITAL_COMMUNITY): Payer: Self-pay | Admitting: Oncology

## 2016-06-15 DIAGNOSIS — E876 Hypokalemia: Secondary | ICD-10-CM

## 2016-06-15 LAB — IGG, IGA, IGM
IGA: 12 mg/dL — AB (ref 87–352)
IGM, SERUM: 96 mg/dL (ref 26–217)
IgG (Immunoglobin G), Serum: 1087 mg/dL (ref 700–1600)

## 2016-06-15 MED ORDER — POTASSIUM CHLORIDE CRYS ER 20 MEQ PO TBCR: 20.0000 meq | EXTENDED_RELEASE_TABLET | Freq: Two times a day (BID) | ORAL | 0 refills | Status: DC

## 2016-06-24 ENCOUNTER — Ambulatory Visit (INDEPENDENT_AMBULATORY_CARE_PROVIDER_SITE_OTHER): Payer: Medicare Other | Admitting: Family Medicine

## 2016-06-24 ENCOUNTER — Encounter: Payer: Self-pay | Admitting: Family Medicine

## 2016-06-24 VITALS — BP 136/78 | HR 80 | Temp 99.5°F | Resp 18 | Ht 63.0 in | Wt 142.1 lb

## 2016-06-24 DIAGNOSIS — J069 Acute upper respiratory infection, unspecified: Secondary | ICD-10-CM

## 2016-06-24 DIAGNOSIS — J4551 Severe persistent asthma with (acute) exacerbation: Secondary | ICD-10-CM | POA: Diagnosis not present

## 2016-06-24 MED ORDER — PREDNISONE 20 MG PO TABS: 20.0000 mg | ORAL_TABLET | Freq: Two times a day (BID) | ORAL | 0 refills | Status: DC

## 2016-06-24 MED ORDER — AMOXICILLIN-POT CLAVULANATE 875-125 MG PO TABS: 1.0000 | ORAL_TABLET | Freq: Two times a day (BID) | ORAL | 0 refills | Status: DC

## 2016-06-24 NOTE — Patient Instructions (Signed)
Push fluids Take the antibiotic and the prednisone twice a day Call for problems

## 2016-06-24 NOTE — Progress Notes (Signed)
Chief Complaint  Patient presents with  . Fever    x 5 days  cough, cold, runny stuffy nose and sinus pressure for 5 d.  Fever.  Increased sputum.  Headache.   Severe asthma - knows to go on an antibiotic for infection Chronic prednisone treatment - will bump for 5 d   Patient Active Problem List   Diagnosis Date Noted  . Peripheral neuropathy due to chemotherapy ([place]) 05/02/2016  . Recurrent herpes labialis 05/02/2016  . Osteoporosis 04/30/2015  . 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 Iron deficiency anemia 01/18/2015  . B12 deficiency 01/18/2015  . SVC syndrome 09/25/2014  . Sinusitis, chronic 05/06/2014  . Abnormal finding on imaging 02/11/2014  . OSA (obstructive sleep apnea) 09/10/2012  . 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 Hypogammaglobulinemia, acquired ([place]) 12/30/2009  . Non Hodgkin's lymphoma ([place]) 09/09/2009  . HLD (hyperlipidemia) 04/21/2007  . Essential hypertension 04/21/2007  . DIABETES MELLITUS, BORDERLINE 04/21/2007  . Severe persistent asthma with acute exacerbation 04/03/2007  . ALLERGIC RHINITIS 10/19/2006  . VOCAL CORD DISORDER 10/19/2006  . GERD 10/19/2006  . GASTRIC POLYP 07/25/2006    Outpatient Encounter Prescriptions as of 06/24/2016  Medication Sig  . acetaminophen (TYLENOL) 500 MG tablet Take 1,000 mg by mouth every 6 (six) hours as needed for pain or fever.   [name] Kitchen acyclovir (ZOVIRAX) 400 MG tablet TAKE ONE TABLET BY MOUTH TWICE DAILY.  [name] Kitchen albuterol (PROAIR HFA) 108 (90 Base) MCG/ACT inhaler Inhale 2 puffs into the lungs every 6 (six) hours as needed.  [name] Kitchen albuterol (PROVENTIL) (2.5 MG/3ML) 0.083% nebulizer solution Take 2.5 mg by nebulization every 4 (four) hours as needed for wheezing or shortness of breath.  [name] Kitchen alendronate (FOSAMAX) 70 MG tablet Take 1 tablet (70 mg total) by mouth once a week. Take with a full glass of water on an empty stomach.  [name] Kitchen amphetamine-dextroamphetamine (ADDERALL) 10 MG tablet Take  10 mg by mouth daily.  . Armodafinil (NUVIGIL) 150 MG tablet Take 150 mg by mouth daily.  [name] Kitchen azithromycin (ZITHROMAX Z-PAK) 250 MG tablet Take two tablets today, then one tablet daily until gone  . citalopram (CELEXA) 20 MG tablet Take 20 mg by mouth every morning.    . cyanocobalamin 1000 MCG tablet Take 1,000 mcg by mouth daily.  . fluticasone (FLONASE) 50 MCG/ACT nasal spray   . furosemide (LASIX) 20 MG tablet Take 1 tablet (20 mg total) by mouth as needed for fluid.  [name] Kitchen gabapentin (NEURONTIN) 300 MG capsule TAKE 1 CAPSULE BY MOUTH FOUR TIMES DAILY.  . mometasone-formoterol (DULERA) 200-5 MCG/ACT AERO Inhale 2 puffs into the lungs 2 (two) times daily.  [name] Kitchen omeprazole (PRILOSEC) 20 MG capsule TAKE ONE CAPSULE BY MOUTH TWICE DAILY BEFORE A MEAL.  [name] Kitchen potassium chloride SA (K-DUR,KLOR-CON) 20 MEQ tablet Take 1 tablet (20 mEq total) by mouth 2 (two) times daily.  . predniSONE (DELTASONE) 10 MG tablet TAKE 1 TABLET BY MOUTH ONCE DAILY. (Patient taking differently: TAKE 1/2 TABLET BY MOUTH ONCE DAILY.)  . simvastatin (ZOCOR) 80 MG tablet Take 80 mg by mouth at bedtime.    [name] Kitchen tiotropium (SPIRIVA) 18 MCG inhalation capsule Place 1 capsule (18 mcg total) into inhaler and inhale daily.  [name] Kitchen warfarin (COUMADIN) 10 MG tablet Take 5 mg by mouth every evening. 4 mg daily  . zafirlukast (ACCOLATE) 20 MG tablet Take 1 tablet (20 mg total) by mouth 2 (two) times daily.  . [DISCONTINUED] predniSONE (DELTASONE) 5 MG tablet Take 1 tablet (5  mg total) by mouth daily with breakfast.  . amoxicillin-clavulanate (AUGMENTIN) 875-125 MG tablet Take 1 tablet by mouth 2 (two) times daily.  . predniSONE (DELTASONE) 20 MG tablet Take 1 tablet (20 mg total) by mouth 2 (two) times daily with a meal.   Facility-Administered Encounter Medications as of 06/24/2016  Medication  . 0.9 %  sodium chloride infusion  . dextrose 5 % solution  . sodium chloride 0.9 % injection 10 mL    Allergies  Allergen Reactions  . Meperidine Hcl  Anaphylaxis  . Montelukast Sodium Hives and Rash    Review of Systems  Constitutional: Positive for fatigue and fever. Negative for activity change, appetite change, chills and unexpected weight change.  HENT: Positive for postnasal drip, rhinorrhea, sinus pain and sinus pressure. Negative for congestion, dental problem, sore throat and trouble swallowing.   Eyes: Negative for redness and visual disturbance.  Respiratory: Positive for cough and wheezing. Negative for shortness of breath.   Cardiovascular: Negative for chest pain, palpitations and leg swelling.  Gastrointestinal: Negative for abdominal pain, constipation, diarrhea and nausea.  Genitourinary: Negative for difficulty urinating and frequency.  Musculoskeletal: Negative for arthralgias and back pain.  Neurological: Negative for dizziness and headaches.  Hematological: Bruises/bleeds easily.  Psychiatric/Behavioral: Negative for dysphoric mood and sleep disturbance. The patient is not nervous/anxious.    BP 136/78 (BP Location: Right Arm, Patient Position: Sitting, Cuff Size: Normal)   Pulse 80   Temp 99.5 F (37.5 C) (Temporal)   Resp 18   Ht 5\' 3"  (1.6 m)   Wt 142 lb 1.9 oz (64.5 kg)   SpO2 94%   BMI 25.18 kg/m   Physical Exam  Constitutional: She is oriented to person, place, and time. She appears well-developed and well-nourished. No distress.  Mildly ill.  Harsh cough  HENT:  Head: Normocephalic and atraumatic.  Right Ear: External ear normal.  Left Ear: External ear normal.  Mouth/Throat: Oropharynx is clear and moist.  Sinus tenderness frontal  Eyes: Conjunctivae are normal. Pupils are equal, round, and reactive to light.  Neck: Normal range of motion.  Cardiovascular: Normal rate, regular rhythm and normal heart sounds.   Pulmonary/Chest: Effort normal. She has wheezes. She has no rales.  Few central rhonchi  Musculoskeletal:  No clubbing, cyanosis, or edema  Lymphadenopathy:    She has cervical  adenopathy.  Neurological: She is alert and oriented to person, place, and time.  Skin:  Thin, bruised forearms  Psychiatric: She has a normal mood and affect. Her behavior is normal. Thought content normal.   ASSESSMENT/PLAN:  1. Severe persistent asthma with exacerbation augmentin X 5 d Prednisone 20 BID for 5 d  2. URI, acute    Patient Instructions  Push fluids Take the antibiotic and the prednisone twice a day Call for problems   [name], MD

## 2016-07-04 ENCOUNTER — Telehealth: Payer: Self-pay | Admitting: Pulmonary Disease

## 2016-07-04 DIAGNOSIS — G4733 Obstructive sleep apnea (adult) (pediatric): Secondary | ICD-10-CM | POA: Diagnosis not present

## 2016-07-04 NOTE — Telephone Encounter (Signed)
lmtcb x1 for pt. 

## 2016-07-05 IMAGING — DX DG CHEST 2V
2 series · 2 of 2 positions shown · non-contrast
Comparison: 05/19/2014.  CT 01/16/2014.

CLINICAL DATA: Cough.  Surgery.

EXAM:
CHEST  2 VIEW

[chest pa]
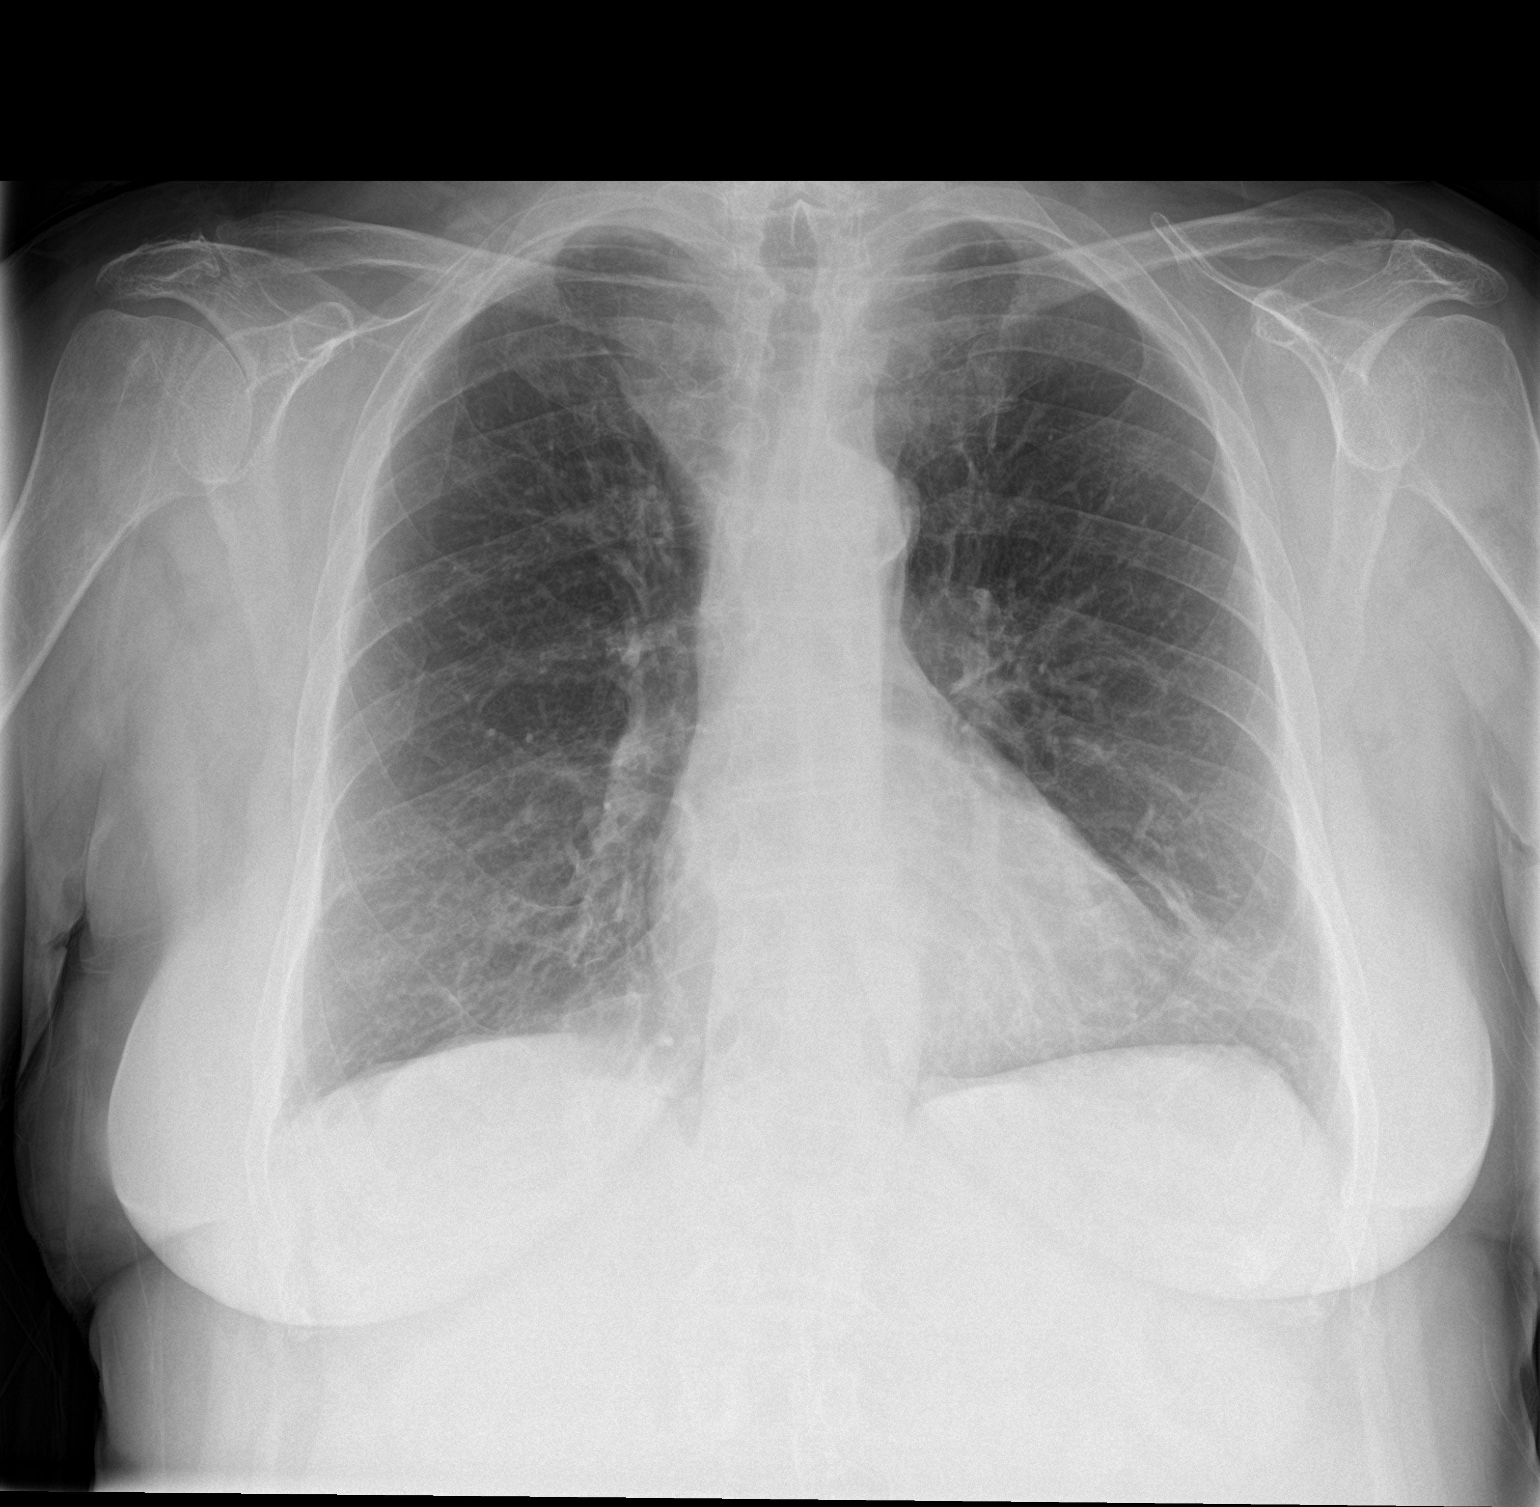

[chest lat]
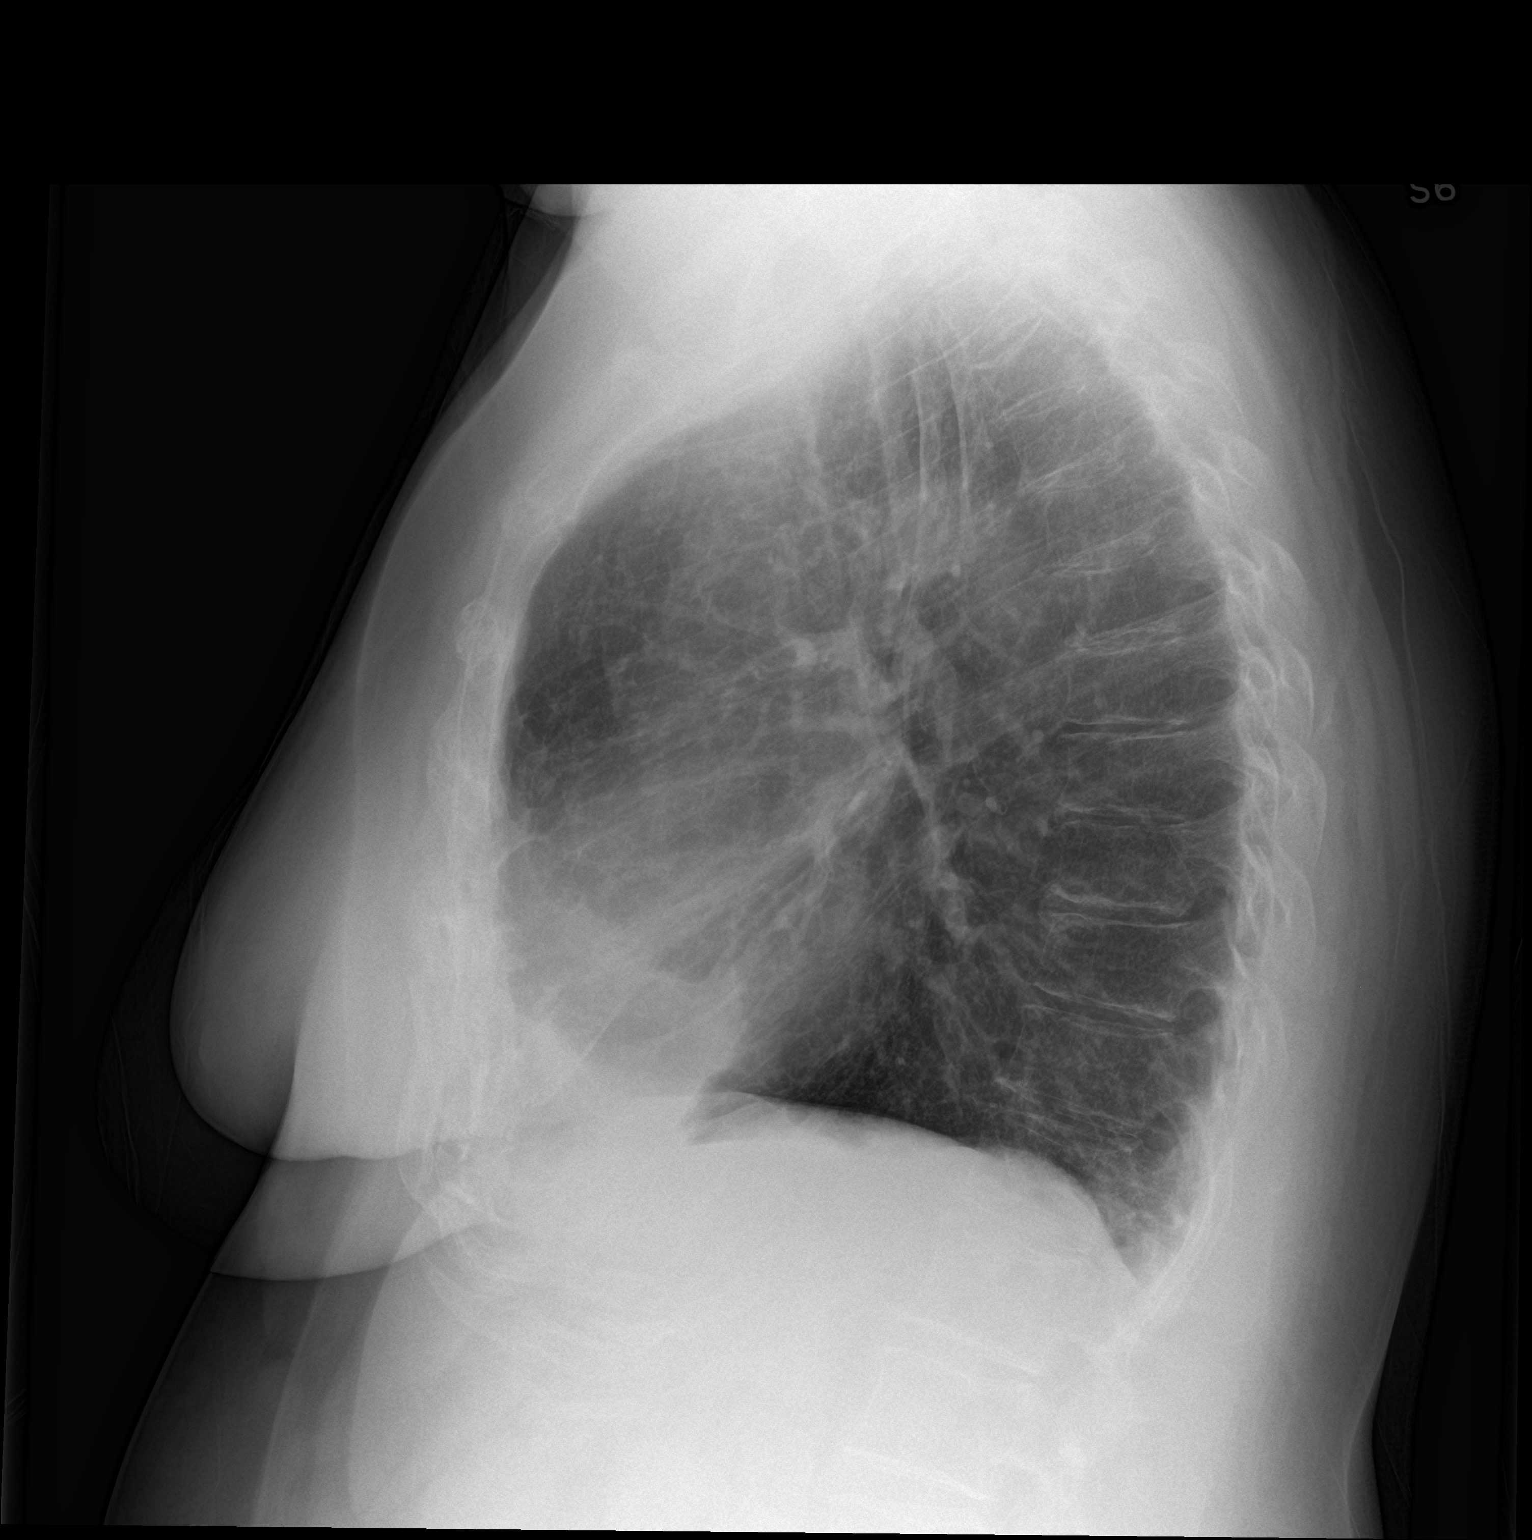

[2 of 2 positions shown; findings below may reference images not displayed]

FINDINGS: Mediastinum hilar structures are normal. Left base pleural
parenchymal thickening consistent with scarring and bronchiectasis
again noted. No pleural effusion or pneumothorax. Heart size normal.
Prior cervical spine fusion. Osteopenia and degenerative changes
thoracic spine.
IMPRESSION: Stable changes of left base pleural-parenchymal scarring and
bronchiectasis again noted. No acute cardiopulmonary disease.

## 2016-07-05 MED ORDER — OMEPRAZOLE 20 MG PO CPDR: DELAYED_RELEASE_CAPSULE | ORAL | 1 refills | Status: DC

## 2016-07-05 NOTE — Telephone Encounter (Signed)
Pt returned call She is requesting a refill on her Omeprazole 20mg  BID Last ov 5.18.18 w/ JN Refills sent to verified pharmacy  Nothing further needed; will sign off

## 2016-07-12 ENCOUNTER — Other Ambulatory Visit (HOSPITAL_COMMUNITY): Payer: Medicare Other

## 2016-07-12 ENCOUNTER — Other Ambulatory Visit (HOSPITAL_COMMUNITY): Payer: Self-pay | Admitting: *Deleted

## 2016-07-12 ENCOUNTER — Ambulatory Visit (HOSPITAL_COMMUNITY): Payer: Medicare Other

## 2016-07-12 DIAGNOSIS — C8330 Diffuse large B-cell lymphoma, unspecified site: Secondary | ICD-10-CM

## 2016-07-13 ENCOUNTER — Encounter (HOSPITAL_BASED_OUTPATIENT_CLINIC_OR_DEPARTMENT_OTHER): Payer: Medicare Other

## 2016-07-13 ENCOUNTER — Encounter (HOSPITAL_COMMUNITY): Payer: Self-pay

## 2016-07-13 ENCOUNTER — Encounter (HOSPITAL_COMMUNITY): Payer: Medicare Other | Attending: Oncology

## 2016-07-13 VITALS — BP 135/79 | HR 84 | Temp 98.9°F | Resp 20 | Wt 143.4 lb

## 2016-07-13 DIAGNOSIS — D801 Nonfamilial hypogammaglobulinemia: Secondary | ICD-10-CM

## 2016-07-13 DIAGNOSIS — C8330 Diffuse large B-cell lymphoma, unspecified site: Secondary | ICD-10-CM | POA: Diagnosis not present

## 2016-07-13 LAB — COMPREHENSIVE METABOLIC PANEL
ALT: 17 U/L (ref 14–54)
AST: 21 U/L (ref 15–41)
Albumin: 3.2 g/dL — ABNORMAL LOW (ref 3.5–5.0)
Alkaline Phosphatase: 71 U/L (ref 38–126)
Anion gap: 10 (ref 5–15)
BILIRUBIN TOTAL: 0.7 mg/dL (ref 0.3–1.2)
BUN: 9 mg/dL (ref 6–20)
CALCIUM: 8.7 mg/dL — AB (ref 8.9–10.3)
CHLORIDE: 97 mmol/L — AB (ref 101–111)
CO2: 28 mmol/L (ref 22–32)
CREATININE: 0.92 mg/dL (ref 0.44–1.00)
Glucose, Bld: 156 mg/dL — ABNORMAL HIGH (ref 65–99)
Potassium: 3.5 mmol/L (ref 3.5–5.1)
Sodium: 135 mmol/L (ref 135–145)
TOTAL PROTEIN: 6.5 g/dL (ref 6.5–8.1)

## 2016-07-13 LAB — CBC WITH DIFFERENTIAL/PLATELET
Basophils Absolute: 0 10*3/uL (ref 0.0–0.1)
Basophils Relative: 0 %
EOS PCT: 1 %
Eosinophils Absolute: 0.1 10*3/uL (ref 0.0–0.7)
HEMATOCRIT: 38.1 % (ref 36.0–46.0)
Hemoglobin: 12.2 g/dL (ref 12.0–15.0)
LYMPHS ABS: 0.8 10*3/uL (ref 0.7–4.0)
LYMPHS PCT: 7 %
MCH: 30.3 pg (ref 26.0–34.0)
MCHC: 32 g/dL (ref 30.0–36.0)
MCV: 94.8 fL (ref 78.0–100.0)
MONO ABS: 0.9 10*3/uL (ref 0.1–1.0)
Monocytes Relative: 8 %
NEUTROS ABS: 9.3 10*3/uL — AB (ref 1.7–7.7)
Neutrophils Relative %: 84 %
PLATELETS: 163 10*3/uL (ref 150–400)
RBC: 4.02 MIL/uL (ref 3.87–5.11)
RDW: 14 % (ref 11.5–15.5)
WBC: 11.1 10*3/uL — AB (ref 4.0–10.5)

## 2016-07-13 LAB — LACTATE DEHYDROGENASE: LDH: 193 U/L — AB (ref 98–192)

## 2016-07-13 MED ORDER — SODIUM CHLORIDE 0.9 % IV SOLN: Freq: Once | INTRAVENOUS | Status: AC

## 2016-07-13 MED ORDER — ACETAMINOPHEN 325 MG PO TABS: 650.0000 mg | ORAL_TABLET | Freq: Four times a day (QID) | ORAL | Status: AC | PRN

## 2016-07-13 MED ORDER — IMMUNE GLOBULIN (HUMAN) 10 GM/100ML IV SOLN
50.0000 g | Freq: Once | INTRAVENOUS | Status: AC
  Administered 2016-07-13: 50 g via INTRAVENOUS
  Filled 2016-07-13: qty 100

## 2016-07-13 MED ORDER — DIPHENHYDRAMINE HCL 25 MG PO CAPS: 25.0000 mg | ORAL_CAPSULE | Freq: Once | ORAL | Status: AC

## 2016-07-13 MED ORDER — DEXTROSE 5 % IV SOLN
INTRAVENOUS | Status: AC
  Administered 2016-07-13: 10:00:00 via INTRAVENOUS

## 2016-07-13 NOTE — Patient Instructions (Signed)
[facility] at [facility] Discharge Instructions  RECOMMENDATIONS MADE BY THE CONSULTANT AND ANY TEST RESULTS WILL BE SENT TO YOUR REFERRING PHYSICIAN.  Received IVIG infusion today. Follow-up as scheduled. Call clinic for any questions or concerns  Thank you for choosing [facility] at [facility] to provide your oncology and hematology care.  To afford each patient quality time with our provider, please arrive at least 15 minutes before your scheduled appointment time.    If you have a lab appointment with the Cancer Center please come in thru the  Main Entrance and check in at the main information desk  You need to re-schedule your appointment should you arrive 10 or more minutes late.  We strive to give you quality time with our providers, and arriving late affects you and other patients whose appointments are after yours.  Also, if you no show three or more times for appointments you may be dismissed from the clinic at the providers discretion.     Again, thank you for choosing [facility].  Our hope is that these requests will decrease the amount of time that you wait before being seen by our physicians.       _____________________________________________________________  Should you have questions after your visit to [facility], please contact our office at [phone] between the hours of 8:30 a.m. and 4:30 p.m.  Voicemails left after 4:30 p.m. will not be returned until the following business day.  For prescription refill requests, have your pharmacy contact our office.       Resources For Cancer Patients and their Caregivers ? American Cancer Society: Can assist with transportation, wigs, general needs, runs Look Good Feel Better.        1-888-227-6333 ? Cancer Care: Provides financial assistance, online support groups, medication/co-pay assistance.  1-800-813-HOPE (4673) ? [facility] Assists [place] cancer patients and their families through emotional , educational and financial support.  336-427-4357 ? [place] DSS Where to apply for food stamps, Medicaid and utility assistance. 336-342-1394 ? [employer]: Transportation to medical appointments. 336-347-2287 ? Social Security Administration: May apply for disability if have a Stage IV cancer. 336-342-7796 1-800-772-1213 ? [place] Aging, Disability and Transit Services: Assists with nutrition, care and transit needs. 336-349-2343  Cancer Center Support Programs: @10RELATIVEDAYS@ > Cancer Support Group  2nd Tuesday of the month 1pm-2pm, Journey Room  > Creative Journey  3rd Tuesday of the month 1130am-1pm, Journey Room  > Look Good Feel Better  1st Wednesday of the month 10am-12 noon, Journey Room (Call American Cancer [employer] to register 1-800-395-5775)   

## 2016-07-13 NOTE — Patient Instructions (Signed)
[facility] at [facility] Discharge Instructions  RECOMMENDATIONS MADE BY THE CONSULTANT AND ANY TEST RESULTS WILL BE SENT TO YOUR REFERRING PHYSICIAN.  Received IVIG infusion today. Follow-up as scheduled. Call clinic for any questions or concerns  Thank you for choosing [facility] at [facility] to provide your oncology and hematology care.  To afford each patient quality time with our provider, please arrive at least 15 minutes before your scheduled appointment time.    If you have a lab appointment with the Cancer Center please come in thru the  Main Entrance and check in at the main information desk  You need to re-schedule your appointment should you arrive 10 or more minutes late.  We strive to give you quality time with our providers, and arriving late affects you and other patients whose appointments are after yours.  Also, if you no show three or more times for appointments you may be dismissed from the clinic at the providers discretion.     Again, thank you for choosing [facility].  Our hope is that these requests will decrease the amount of time that you wait before being seen by our physicians.       _____________________________________________________________  Should you have questions after your visit to [facility], please contact our office at [phone] between the hours of 8:30 a.m. and 4:30 p.m.  Voicemails left after 4:30 p.m. will not be returned until the following business day.  For prescription refill requests, have your pharmacy contact our office.       Resources For Cancer Patients and their Caregivers ? American Cancer Society: Can assist with transportation, wigs, general needs, runs Look Good Feel Better.        1-888-227-6333 ? Cancer Care: Provides financial assistance, online support groups, medication/co-pay assistance.  1-800-813-HOPE (4673) ? [facility] Assists [place] cancer patients and their families through emotional , educational and financial support.  336-427-4357 ? [place] DSS Where to apply for food stamps, Medicaid and utility assistance. 336-342-1394 ? RCATS: Transportation to medical appointments. 336-347-2287 ? Social Security Administration: May apply for disability if have a Stage IV cancer. 336-342-7796 1-800-772-1213 ? [place] Aging, Disability and Transit Services: Assists with nutrition, care and transit needs. 336-349-2343  Cancer Center Support Programs: @10RELATIVEDAYS@ > Cancer Support Group  2nd Tuesday of the month 1pm-2pm, Journey Room  > Creative Journey  3rd Tuesday of the month 1130am-1pm, Journey Room  > Look Good Feel Better  1st Wednesday of the month 10am-12 noon, Journey Room (Call American Cancer Society to register 1-800-395-5775)   

## 2016-07-13 NOTE — Progress Notes (Signed)
[name] tolerated IVIG infusion well without complaints or incident. VSS prior to,during and after IVIG infusion. Pt discharged self ambulatory in satisfactory condition

## 2016-07-14 LAB — IGG, IGA, IGM
IGA: 11 mg/dL — AB (ref 87–352)
IGG (IMMUNOGLOBIN G), SERUM: 922 mg/dL (ref 700–1600)
IGM, SERUM: 101 mg/dL (ref 26–217)

## 2016-07-18 IMAGING — CT CT ABD-PELV W/ CM
2 of 5 series · 15 of 46 positions shown, 17 images · IV contrast (Omnipaque 300)
Comparison: PET-CT 06/24/2013. CT of the chest, abdomen and pelvis
11/09/2012.

CLINICAL DATA: 59-year-old female with history of left-sided
abdominal pain for 1 month. History of non-Hodgkin's lymphoma and
chronic kidney disease. Prior history of vesicovaginal fistula
closure.

EXAM:
CT ABDOMEN AND PELVIS WITH CONTRAST
TECHNIQUE: Multidetector CT imaging of the abdomen and pelvis was performed
using the standard protocol following bolus administration of
intravenous contrast.
CONTRAST:  100mL OMNIPAQUE IOHEXOL 300 MG/ML  SOLN

[Series 2: abd_pel_with 5.0 b40f · axial · 0.74mm/px · z∈[-433,-53]mm · 12 of 86 slices shown, 14 images]
[im 5/86  soft-tissue]
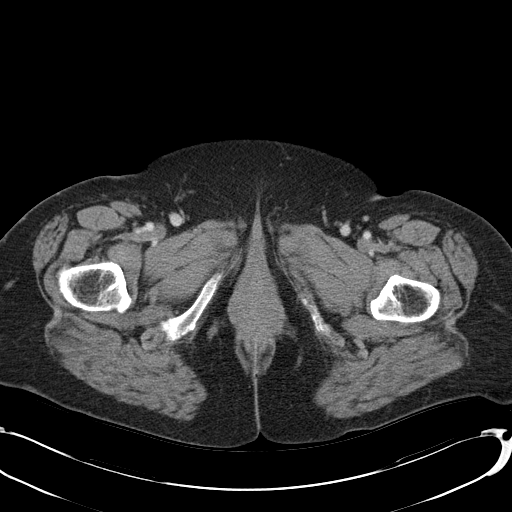
[im 5/86  bone]
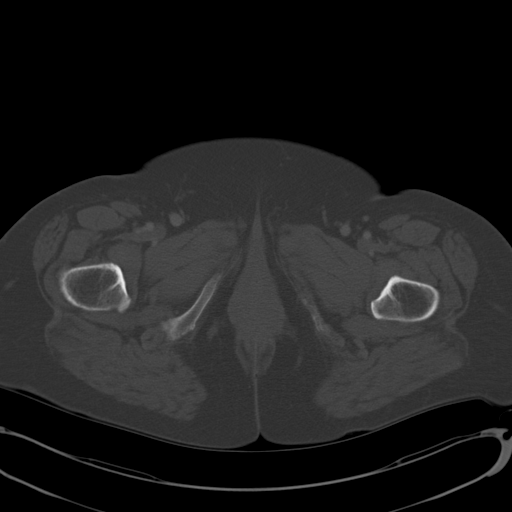
[im 14/86  soft-tissue]
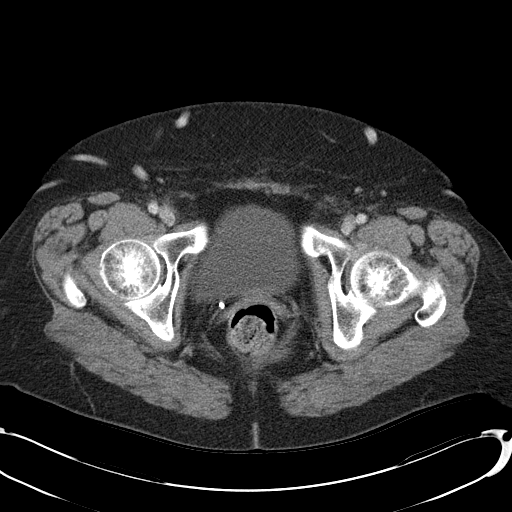
[im 18/86  soft-tissue]
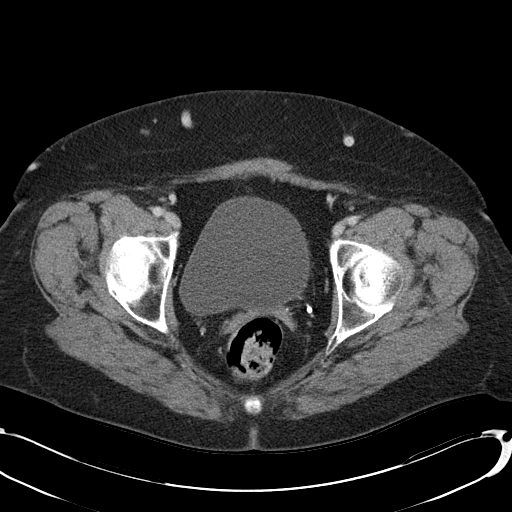
[im 27/86  soft-tissue]
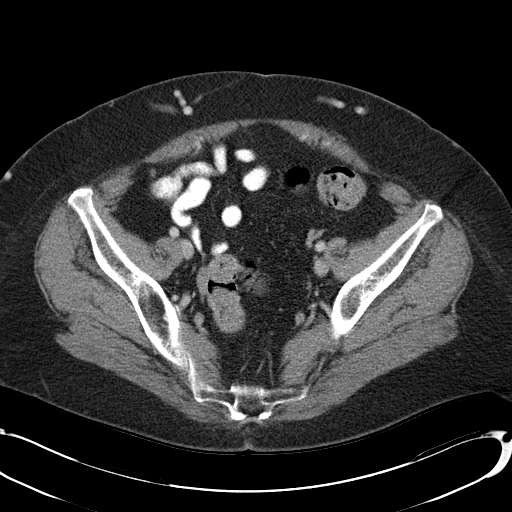
[im 32/86  soft-tissue]
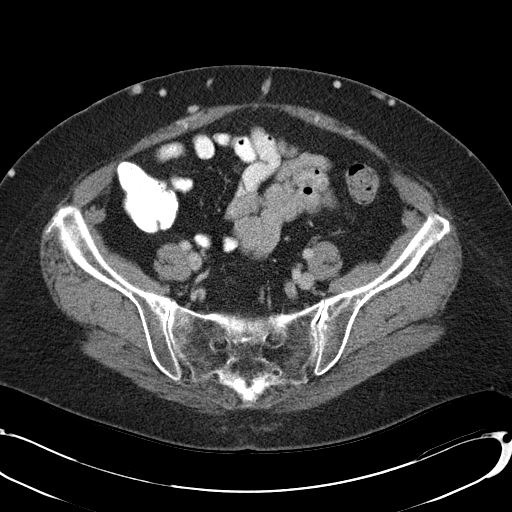
[im 41/86  soft-tissue]
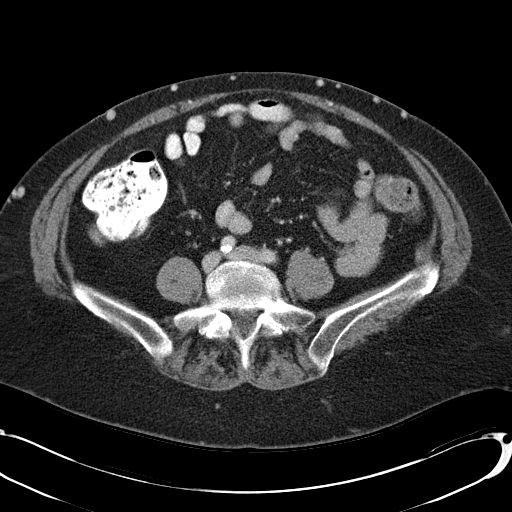
[im 45/86  soft-tissue]
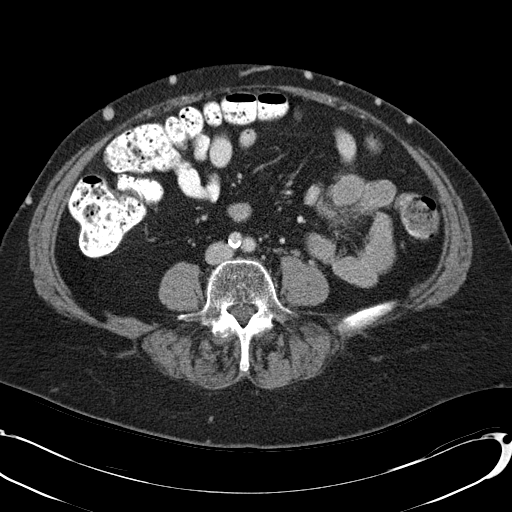
[im 54/86  soft-tissue]
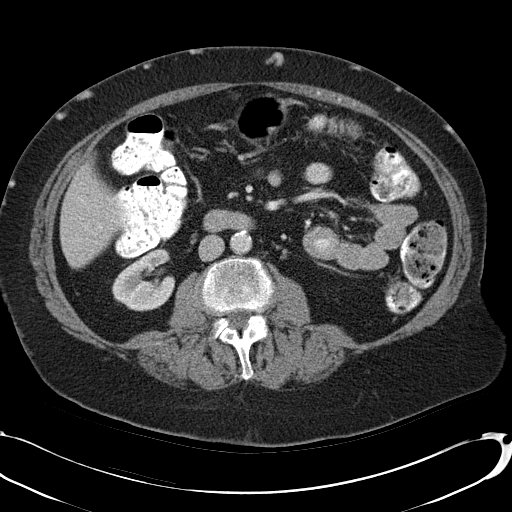
[im 59/86  soft-tissue]
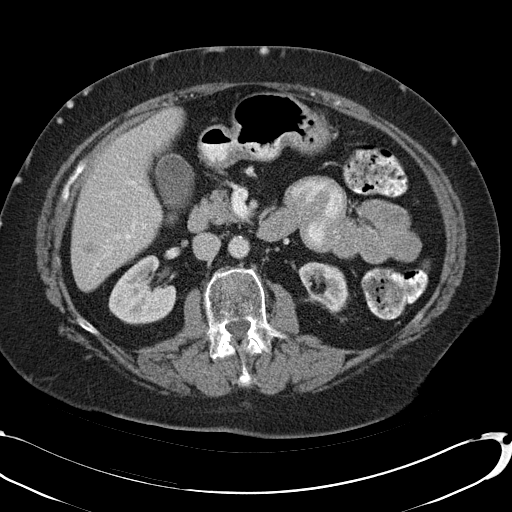
[im 59/86  bone]
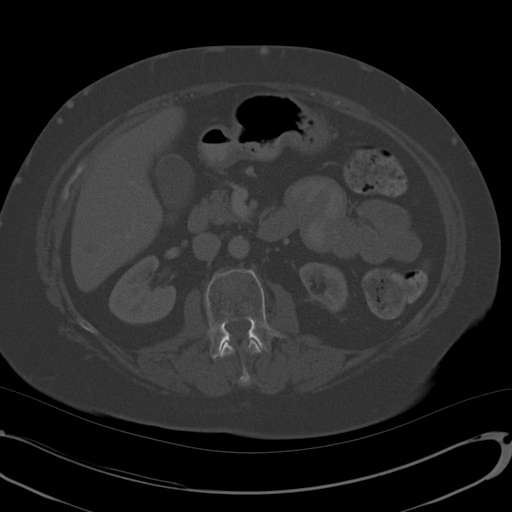
[im 68/86  soft-tissue]
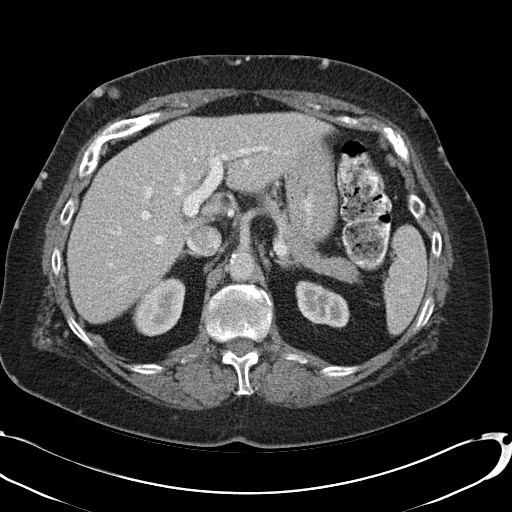
[im 72/86  soft-tissue]
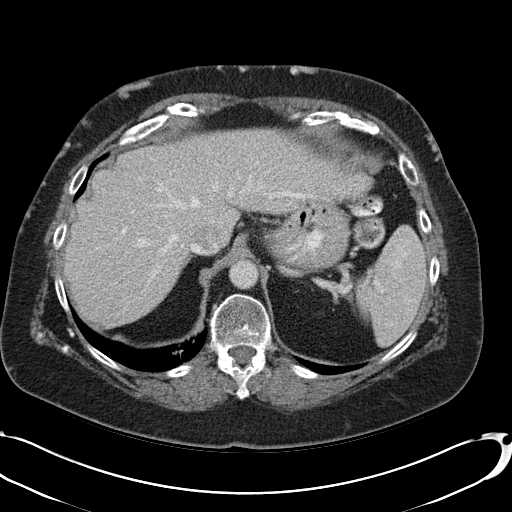
[im 81/86  soft-tissue]
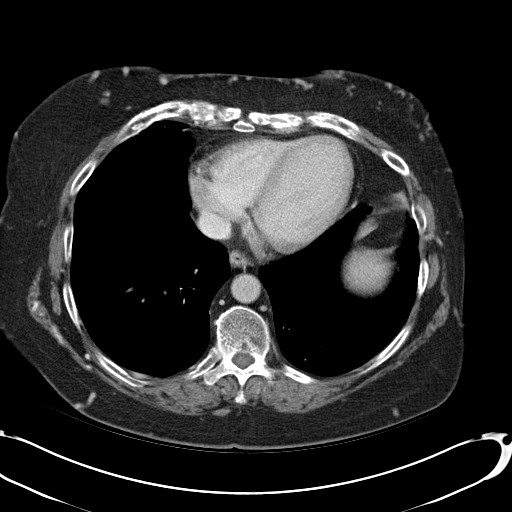

[Series 3: abd_pel_with 3.0 spo cor · coronal · 0.75mm/px · 3 of 92 slices shown]
[im 31/92  soft-tissue]
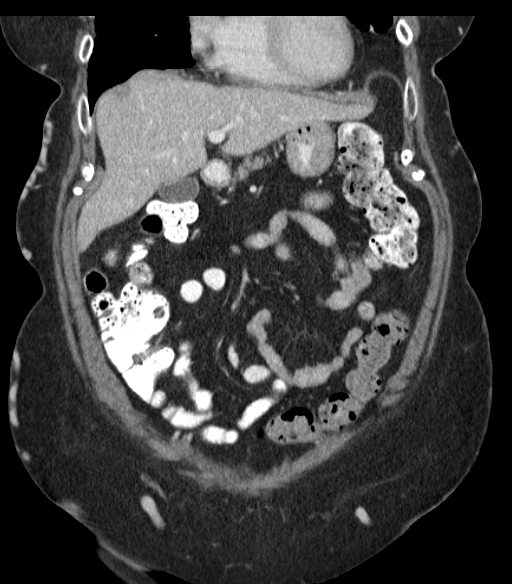
[im 41/92  soft-tissue]
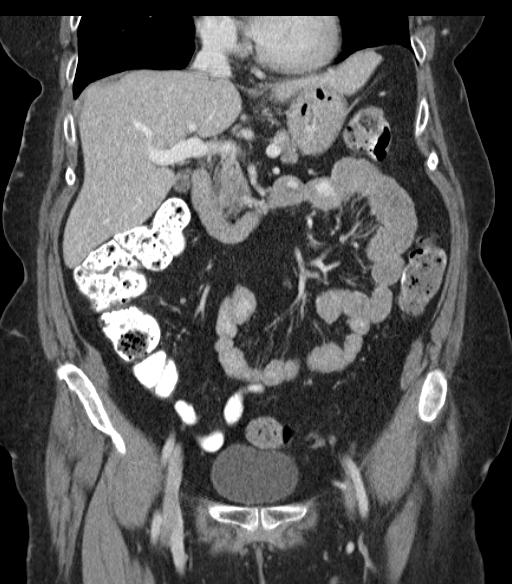
[im 51/92  soft-tissue]
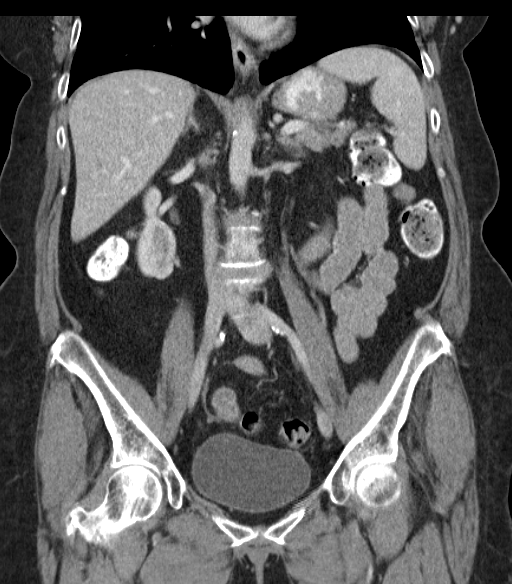

[15 of 46 positions shown; findings below may reference images not displayed]

FINDINGS: Lower chest: Scattered areas of bronchial wall thickening and
cylindrical bronchiectasis in the visualize lung bases, most severe
in the right lower lobe, medial left lower lobe and inferior segment
of the lingula. There are some associated areas of architectural
distortion, presumably chronic post infectious/inflammatory
scarring.

Hepatobiliary: Two ill-defined 1 cm low-attenuation lesions in
segment 6 of the liver (image 29 of series 2, and image 23 of series
2), which are nonspecific and too small to definitively
characterize, but similar to prior examination from 11/09/2012,
likely benign. No other new suspicious appearing cystic or solid
hepatic lesions are noted. No intra or extrahepatic biliary ductal
dilatation. Gallbladder is unremarkable in appearance.

Pancreas: No pancreatic mass. No pancreatic ductal dilatation. No
pancreatic or peripancreatic fluid or inflammatory changes.

Spleen: Unremarkable.

Adrenals/Urinary Tract: Bilateral adrenal glands and the right
kidney are normal in appearance. Atrophy and multifocal cortical
thinning in the left kidney, presumably secondary to post infectious
or inflammatory scarring, similar to prior study 11/09/2012. No
hydroureteronephrosis. Urinary bladder is normal in appearance.

Stomach/Bowel: Normal appearance of the stomach. No pathologic
dilatation of small bowel or colon. Appendix is not confidently
identified, presumably surgically absent. Regardless, there are no
inflammatory changes adjacent to the cecum to suggest presence of an
acute appendicitis at this time.

Vascular/Lymphatic: Atherosclerosis throughout the abdominal and
pelvic vasculature, without evidence of aneurysm or dissection.
Numerous venous collaterals noted throughout the anterior abdominal
wall extending into the anterior chest wall. Deep veins of the
pelvis appear grossly patent. IVC is also patent. No lymphadenopathy
noted in the abdomen or pelvis.

Reproductive: Status post hysterectomy. Ovaries are not confidently
identified may be surgically absent or atrophic.

Other: No significant volume of ascites.  No pneumoperitoneum.

Musculoskeletal: There are no aggressive appearing lytic or blastic
lesions noted in the visualized portions of the skeleton.
IMPRESSION: 1. No acute findings in the abdomen or pelvis to account for the
patient's history of abdominal pain.
2. Extensive venous collateralization throughout the chest wall and
anterior abdominal wall. No definite source of venous obstruction
identified in the abdomen or pelvis, which implies a thoracic level
venous obstruction, with associated collateralization to bypass into
the patent abdominal and pelvic venous system. These findings appear
to be chronic and are similar to prior studies.
3. Chronic atrophy and scarring in the left kidney is unchanged.
4. 2 small indeterminate lesions in segment 6 of the liver unchanged
compared to prior study 11/09/2012, presumably benign.
5. Scattered areas of bronchiectasis and scarring in the lung bases
bilaterally, similar to prior studies.

## 2016-08-03 DIAGNOSIS — Z79899 Other long term (current) drug therapy: Secondary | ICD-10-CM | POA: Diagnosis not present

## 2016-08-04 ENCOUNTER — Other Ambulatory Visit (HOSPITAL_COMMUNITY): Payer: Self-pay | Admitting: *Deleted

## 2016-08-08 ENCOUNTER — Other Ambulatory Visit (HOSPITAL_COMMUNITY): Payer: Self-pay | Admitting: *Deleted

## 2016-08-08 DIAGNOSIS — C8330 Diffuse large B-cell lymphoma, unspecified site: Secondary | ICD-10-CM

## 2016-08-08 IMAGING — US US EXTREM LOW VENOUS*L*
1 series · 13 of 24 positions shown · non-contrast
Comparison: 01/01/2010

CLINICAL DATA: Pain and swelling LEFT lower extremity post trauma



[Series 1: us extrem low venous*left* · 0.06mm/px · 13 of 38 slices shown]
[im 1/38]
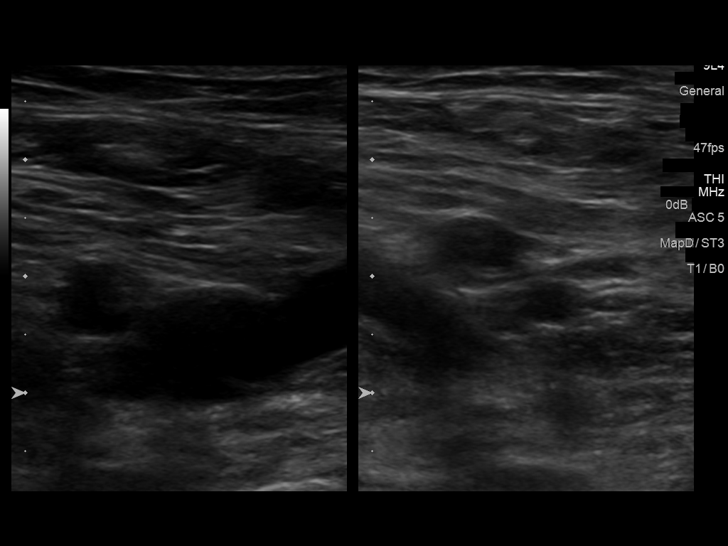
[im 4/38]
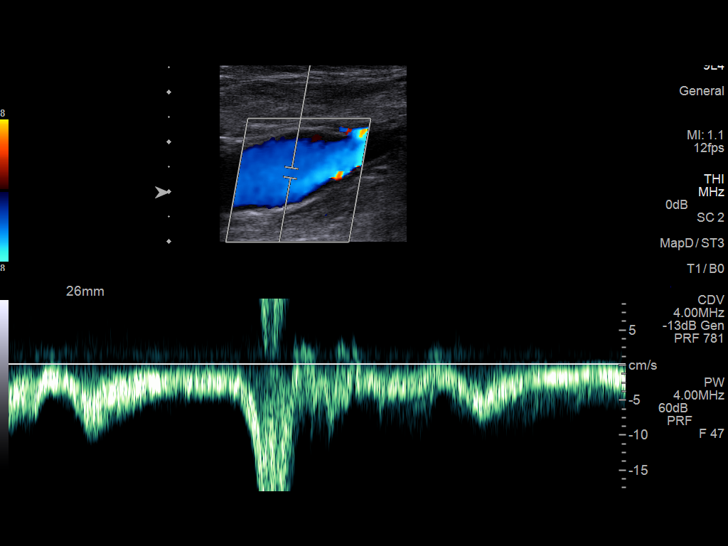
[im 7/38]
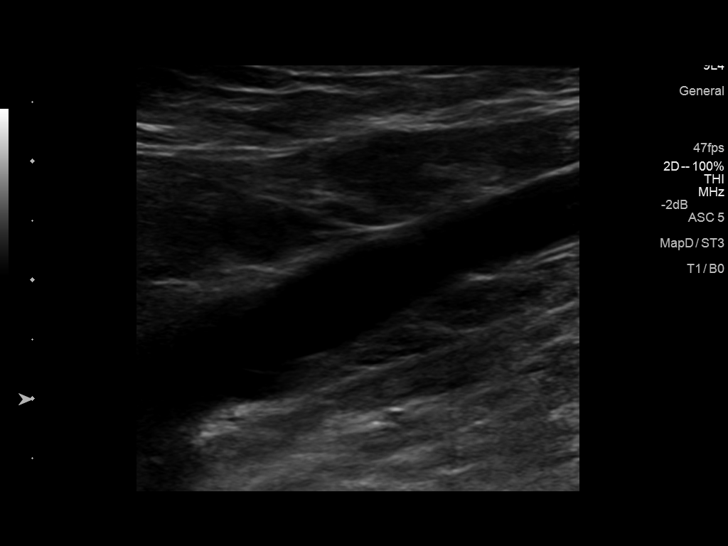
[im 10/38]
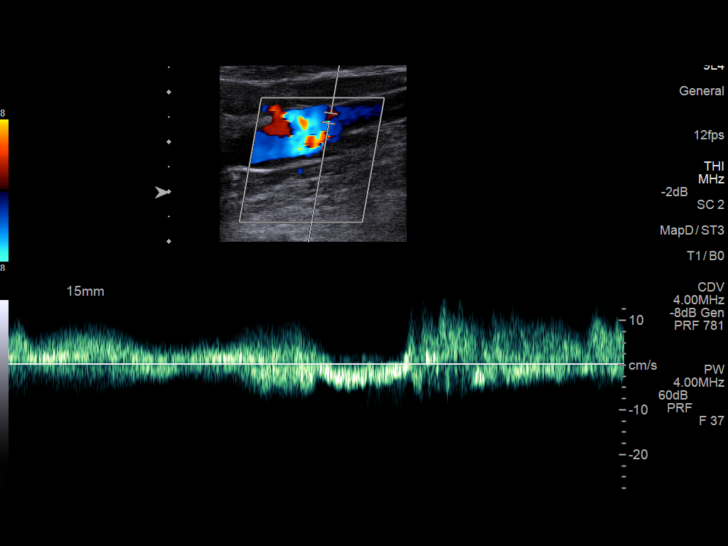
[im 13/38]
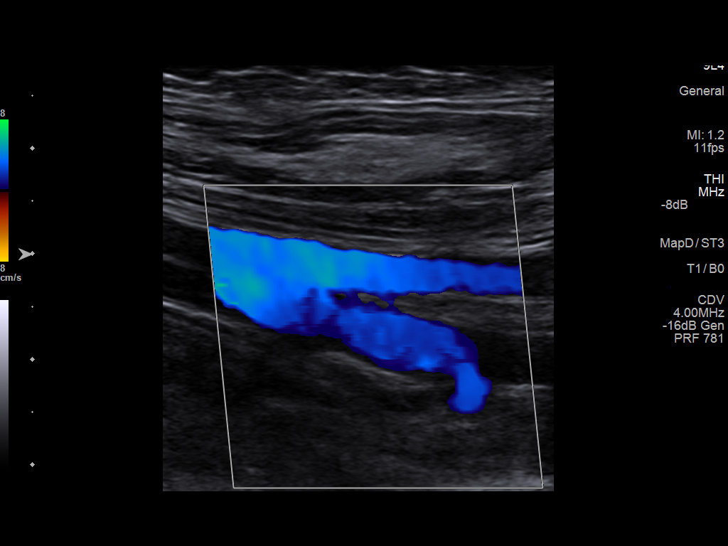
[im 17/38]
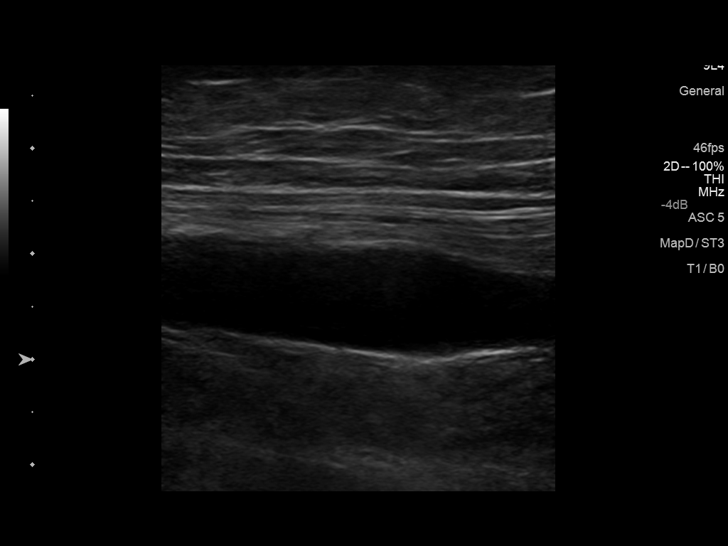
[im 20/38]
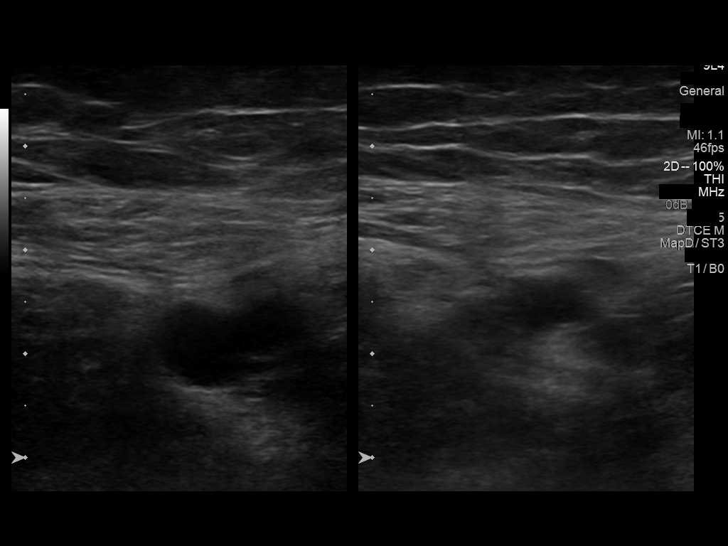
[im 21/38]
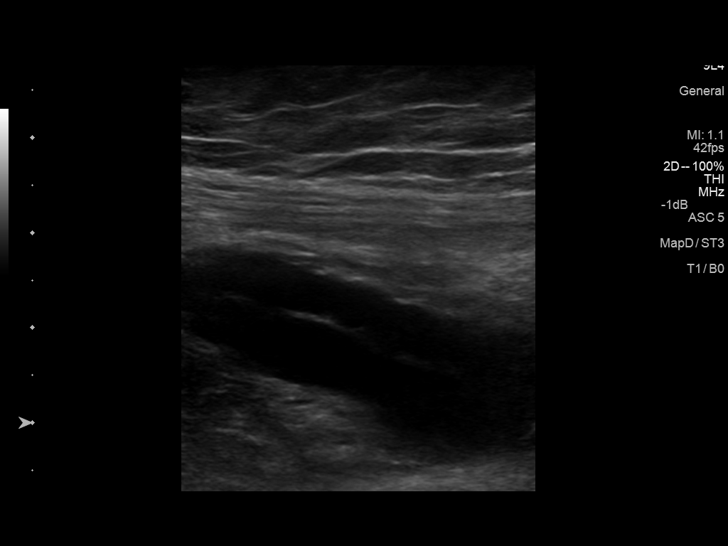
[im 25/38]
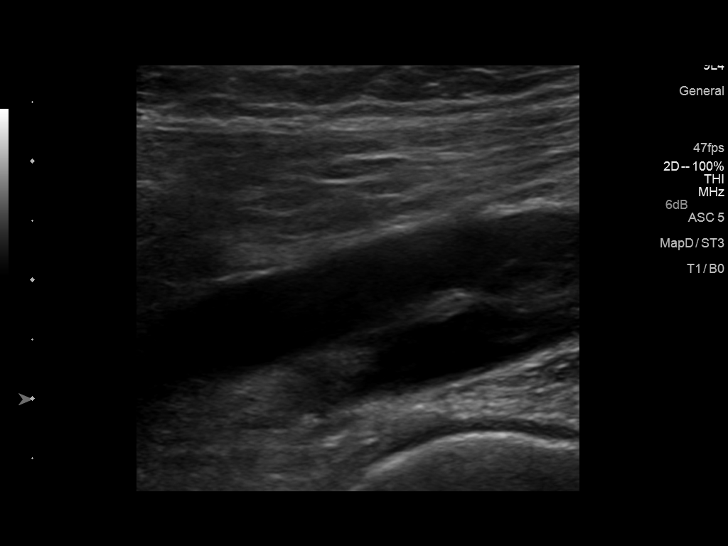
[im 28/38]
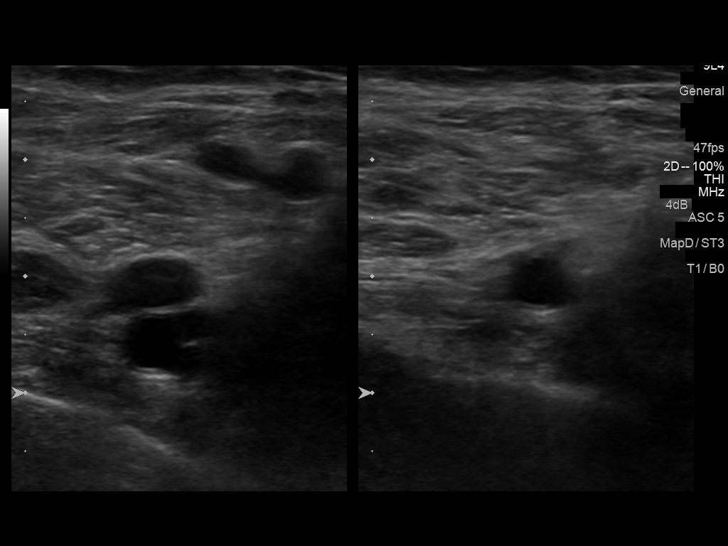
[im 31/38]
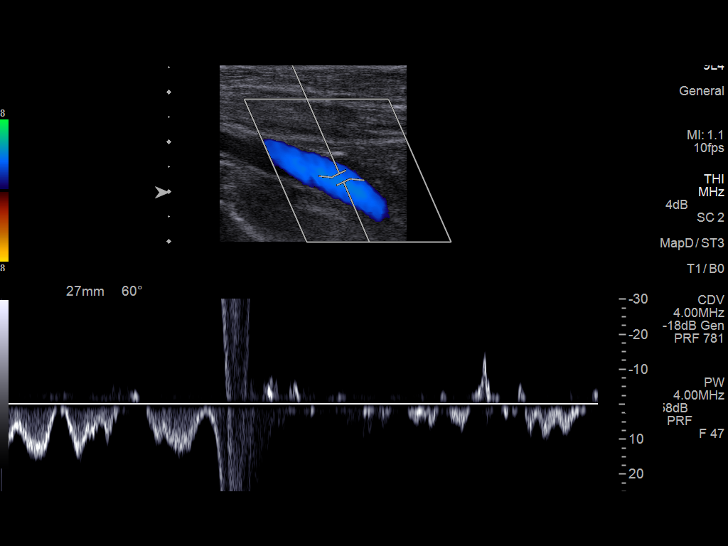
[im 34/38]
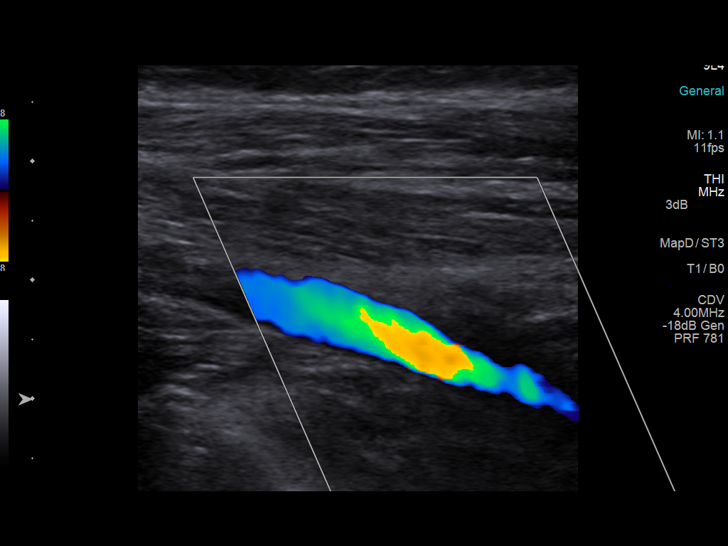
[im 38/38]
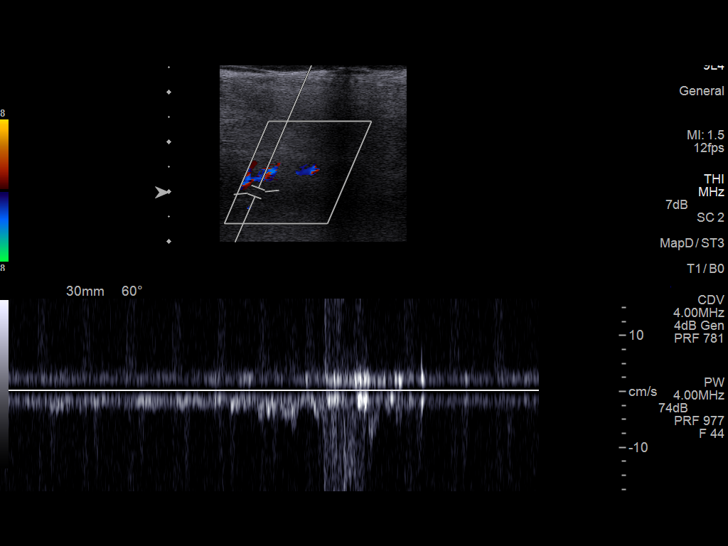

[13 of 24 positions shown; findings below may reference images not displayed]

FINDINGS: Contralateral Common Femoral Vein: Respiratory phasicity is normal
and symmetric with the symptomatic side. No evidence of thrombus.
Normal compressibility.

Common Femoral Vein: No evidence of thrombus. Normal
compressibility, respiratory phasicity and response to augmentation.

Saphenofemoral Junction: No evidence of thrombus. Normal
compressibility and flow on color Doppler imaging.

Profunda Femoral Vein: No evidence of thrombus. Normal
compressibility and flow on color Doppler imaging.

Femoral Vein: No evidence of thrombus. Normal compressibility,
respiratory phasicity and response to augmentation.

Popliteal Vein: No evidence of thrombus. Normal compressibility,
respiratory phasicity and response to augmentation.

Calf Veins: No evidence of thrombus. Normal compressibility and flow
on color Doppler imaging.

Superficial Great Saphenous Vein: No evidence of thrombus. Normal
compressibility and flow on color Doppler imaging.

Venous Reflux:  None.

Other Findings:  None.
IMPRESSION: No evidence of deep venous thrombosis in the LEFT lower extremity.

## 2016-08-09 ENCOUNTER — Encounter (HOSPITAL_COMMUNITY): Payer: Self-pay

## 2016-08-09 ENCOUNTER — Encounter (HOSPITAL_BASED_OUTPATIENT_CLINIC_OR_DEPARTMENT_OTHER): Payer: Medicare Other | Admitting: Oncology

## 2016-08-09 ENCOUNTER — Encounter (HOSPITAL_BASED_OUTPATIENT_CLINIC_OR_DEPARTMENT_OTHER): Payer: Medicare Other

## 2016-08-09 ENCOUNTER — Encounter (HOSPITAL_COMMUNITY): Payer: Medicare Other | Attending: Oncology

## 2016-08-09 VITALS — BP 133/69 | HR 61 | Temp 98.1°F | Resp 18

## 2016-08-09 VITALS — BP 130/79 | HR 82 | Temp 98.0°F | Resp 16 | Wt 144.6 lb

## 2016-08-09 DIAGNOSIS — Z86010 Personal history of colonic polyps: Secondary | ICD-10-CM | POA: Insufficient documentation

## 2016-08-09 DIAGNOSIS — Z823 Family history of stroke: Secondary | ICD-10-CM | POA: Insufficient documentation

## 2016-08-09 DIAGNOSIS — Z7952 Long term (current) use of systemic steroids: Secondary | ICD-10-CM | POA: Diagnosis not present

## 2016-08-09 DIAGNOSIS — D801 Nonfamilial hypogammaglobulinemia: Secondary | ICD-10-CM

## 2016-08-09 DIAGNOSIS — Z7901 Long term (current) use of anticoagulants: Secondary | ICD-10-CM

## 2016-08-09 DIAGNOSIS — Z9889 Other specified postprocedural states: Secondary | ICD-10-CM | POA: Diagnosis not present

## 2016-08-09 DIAGNOSIS — Z8572 Personal history of non-Hodgkin lymphomas: Secondary | ICD-10-CM

## 2016-08-09 DIAGNOSIS — F419 Anxiety disorder, unspecified: Secondary | ICD-10-CM | POA: Diagnosis not present

## 2016-08-09 DIAGNOSIS — Z8261 Family history of arthritis: Secondary | ICD-10-CM | POA: Diagnosis not present

## 2016-08-09 DIAGNOSIS — N189 Chronic kidney disease, unspecified: Secondary | ICD-10-CM | POA: Diagnosis not present

## 2016-08-09 DIAGNOSIS — I129 Hypertensive chronic kidney disease with stage 1 through stage 4 chronic kidney disease, or unspecified chronic kidney disease: Secondary | ICD-10-CM | POA: Diagnosis not present

## 2016-08-09 DIAGNOSIS — D509 Iron deficiency anemia, unspecified: Secondary | ICD-10-CM | POA: Insufficient documentation

## 2016-08-09 DIAGNOSIS — Z825 Family history of asthma and other chronic lower respiratory diseases: Secondary | ICD-10-CM | POA: Diagnosis not present

## 2016-08-09 DIAGNOSIS — J45909 Unspecified asthma, uncomplicated: Secondary | ICD-10-CM | POA: Insufficient documentation

## 2016-08-09 DIAGNOSIS — J449 Chronic obstructive pulmonary disease, unspecified: Secondary | ICD-10-CM | POA: Diagnosis not present

## 2016-08-09 DIAGNOSIS — K219 Gastro-esophageal reflux disease without esophagitis: Secondary | ICD-10-CM | POA: Diagnosis not present

## 2016-08-09 DIAGNOSIS — M810 Age-related osteoporosis without current pathological fracture: Secondary | ICD-10-CM | POA: Insufficient documentation

## 2016-08-09 DIAGNOSIS — C8330 Diffuse large B-cell lymphoma, unspecified site: Secondary | ICD-10-CM | POA: Insufficient documentation

## 2016-08-09 DIAGNOSIS — Z90710 Acquired absence of both cervix and uterus: Secondary | ICD-10-CM | POA: Insufficient documentation

## 2016-08-09 DIAGNOSIS — I252 Old myocardial infarction: Secondary | ICD-10-CM | POA: Diagnosis not present

## 2016-08-09 DIAGNOSIS — Z8249 Family history of ischemic heart disease and other diseases of the circulatory system: Secondary | ICD-10-CM | POA: Insufficient documentation

## 2016-08-09 DIAGNOSIS — D649 Anemia, unspecified: Secondary | ICD-10-CM

## 2016-08-09 DIAGNOSIS — Z79899 Other long term (current) drug therapy: Secondary | ICD-10-CM | POA: Insufficient documentation

## 2016-08-09 DIAGNOSIS — E1122 Type 2 diabetes mellitus with diabetic chronic kidney disease: Secondary | ICD-10-CM | POA: Insufficient documentation

## 2016-08-09 DIAGNOSIS — Z85828 Personal history of other malignant neoplasm of skin: Secondary | ICD-10-CM | POA: Insufficient documentation

## 2016-08-09 DIAGNOSIS — Z806 Family history of leukemia: Secondary | ICD-10-CM | POA: Insufficient documentation

## 2016-08-09 DIAGNOSIS — G629 Polyneuropathy, unspecified: Secondary | ICD-10-CM

## 2016-08-09 DIAGNOSIS — Z86718 Personal history of other venous thrombosis and embolism: Secondary | ICD-10-CM | POA: Insufficient documentation

## 2016-08-09 DIAGNOSIS — C8338 Diffuse large B-cell lymphoma, lymph nodes of multiple sites: Secondary | ICD-10-CM

## 2016-08-09 LAB — COMPREHENSIVE METABOLIC PANEL
ALT: 18 U/L (ref 14–54)
AST: 26 U/L (ref 15–41)
Albumin: 3.5 g/dL (ref 3.5–5.0)
Alkaline Phosphatase: 84 U/L (ref 38–126)
Anion gap: 10 (ref 5–15)
BUN: 10 mg/dL (ref 6–20)
CHLORIDE: 96 mmol/L — AB (ref 101–111)
CO2: 31 mmol/L (ref 22–32)
CREATININE: 0.97 mg/dL (ref 0.44–1.00)
Calcium: 9 mg/dL (ref 8.9–10.3)
Glucose, Bld: 118 mg/dL — ABNORMAL HIGH (ref 65–99)
POTASSIUM: 3.5 mmol/L (ref 3.5–5.1)
SODIUM: 137 mmol/L (ref 135–145)
Total Bilirubin: 0.5 mg/dL (ref 0.3–1.2)
Total Protein: 7 g/dL (ref 6.5–8.1)

## 2016-08-09 LAB — CBC WITH DIFFERENTIAL/PLATELET
BASOS ABS: 0 10*3/uL (ref 0.0–0.1)
Basophils Relative: 0 %
EOS ABS: 0.1 10*3/uL (ref 0.0–0.7)
EOS PCT: 1 %
HCT: 39.9 % (ref 36.0–46.0)
Hemoglobin: 12.5 g/dL (ref 12.0–15.0)
Lymphocytes Relative: 10 %
Lymphs Abs: 1.2 10*3/uL (ref 0.7–4.0)
MCH: 30 pg (ref 26.0–34.0)
MCHC: 31.3 g/dL (ref 30.0–36.0)
MCV: 95.7 fL (ref 78.0–100.0)
MONO ABS: 0.7 10*3/uL (ref 0.1–1.0)
Monocytes Relative: 6 %
Neutro Abs: 9.6 10*3/uL — ABNORMAL HIGH (ref 1.7–7.7)
Neutrophils Relative %: 83 %
PLATELETS: 186 10*3/uL (ref 150–400)
RBC: 4.17 MIL/uL (ref 3.87–5.11)
RDW: 14.2 % (ref 11.5–15.5)
WBC: 11.5 10*3/uL — AB (ref 4.0–10.5)

## 2016-08-09 LAB — LACTATE DEHYDROGENASE: LDH: 195 U/L — AB (ref 98–192)

## 2016-08-09 MED ORDER — SODIUM CHLORIDE 0.9 % IV SOLN
Freq: Once | INTRAVENOUS | Status: DC
Start: 2016-08-09 — End: 2016-08-09

## 2016-08-09 MED ORDER — IMMUNE GLOBULIN (HUMAN) 10 GM/100ML IV SOLN
50.0000 g | Freq: Once | INTRAVENOUS | Status: AC
  Administered 2016-08-09: 50 g via INTRAVENOUS
  Filled 2016-08-09: qty 400

## 2016-08-09 MED ORDER — DIPHENHYDRAMINE HCL 25 MG PO CAPS: 25.0000 mg | ORAL_CAPSULE | Freq: Once | ORAL | Status: DC

## 2016-08-09 MED ORDER — DEXTROSE 5 % IV SOLN
INTRAVENOUS | Status: DC
Start: 2016-08-09 — End: 2016-08-09
  Administered 2016-08-09: 10:00:00 via INTRAVENOUS

## 2016-08-09 MED ORDER — ACETAMINOPHEN 325 MG PO TABS: 650.0000 mg | ORAL_TABLET | Freq: Four times a day (QID) | ORAL | Status: DC | PRN

## 2016-08-09 MED ORDER — SODIUM CHLORIDE 0.9 % IJ SOLN
3.0000 mL | Freq: Once | INTRAMUSCULAR | Status: AC | PRN
  Administered 2016-08-09: 3 mL via INTRAVENOUS

## 2016-08-09 NOTE — Patient Instructions (Signed)
[place] at [facility] Discharge Instructions  RECOMMENDATIONS MADE BY THE CONSULTANT AND ANY TEST RESULTS WILL BE SENT TO YOUR REFERRING PHYSICIAN. Patient received IVIG infusion today and follow up visit with Dr. Talbert Cage.  Return as scheduled and call for any questions or concerns.   Thank you for choosing [place] at [facility] to provide your oncology and hematology care.  To afford each patient quality time with our provider, please arrive at least 15 minutes before your scheduled appointment time.    If you have a lab appointment with the [facility] please come in thru the  Main Entrance and check in at the main information desk  You need to re-schedule your appointment should you arrive 10 or more minutes late.  We strive to give you quality time with our providers, and arriving late affects you and other patients whose appointments are after yours.  Also, if you no show three or more times for appointments you may be dismissed from the clinic at the providers discretion.     Again, thank you for choosing [facility].  Our hope is that these requests will decrease the amount of time that you wait before being seen by our physicians.       _____________________________________________________________  Should you have questions after your visit to [facility], please contact our office at [phone] between the hours of 8:30 a.m. and 4:30 p.m.  Voicemails left after 4:30 p.m. will not be returned until the following business day.  For prescription refill requests, have your pharmacy contact our office.       Resources For Cancer Patients and their Caregivers ? American Cancer Society: Can assist with transportation, wigs, general needs, runs Look Good Feel Better.        336-076-5973 ? Cancer Care: Provides financial assistance, online support groups, medication/co-pay assistance.  1-800-813-HOPE  (432) 264-4261) ? [place] [facility] Assists [place] cancer patients and their families through emotional , educational and financial support.  669-856-6560 ? [place] DSS Where to apply for food stamps, Medicaid and utility assistance. 4077529414 ? [employer]: Transportation to medical appointments. 9301942154 ? Social Security Administration: May apply for disability if have a Stage IV cancer. 7063199772 646 367 6155 ? [employer], Disability and Transit Services: Assists with nutrition, care and transit needs. [place] Support Programs: @10RELATIVEDAYS @ > Cancer Support Group  2nd Tuesday of the month 1pm-2pm, Journey Room  > Creative Journey  3rd Tuesday of the month 1130am-1pm, Journey Room  > Look Good Feel Better  1st Wednesday of the month 10am-12 noon, Journey Room (Call [place] to register [phone])

## 2016-08-09 NOTE — Progress Notes (Signed)
[name] Everts, MD [place] [address]    Hypogammaglobulenemia/IgG deficiency with level of 414 mg/dl Maintenance Rituxan 09/30/2011 Hx of Diffuse large B-Cell Lymphoma, S/P R-CHOP x 4 cycles with intrathecal chemotherapy during cycles 3 and 4 with methotrexate and Solu-Cortef. Treatment was stopped due to Pseudomonas sepsis after cycles 2 and 4 which was very severe  CURRENT THERAPY:50 g IV IgG monthly for IgG deficiency.  INTERVAL HISTORY: [name] 61 y.o. female returns for follow up of IgG deficiency and history of DLBCL.    Patient presents today for continued follow-up. She states she has no new issues but does complain of chronic problem such as her ongoing mouth soreness which her primary care physician is given her acyclovir. She also has chronic neuropathy around her mouth. She denies any recent infections. She denies any chest pain, shortness breath, abdominal pain, nausea, vomiting, diarrhea, focal weakness. She has been tolerating her monthly IVIG well without any side effects or reactions.  MEDICAL HISTORY: Past Medical History:  Diagnosis Date  . Allergic rhinitis   . Allergy   . Anemia   . Anxiety   . Asthma   . B12 deficiency 01/18/2015  . Blood transfusion without reported diagnosis   . Cataract   . Cavitary lung disease   . Chronic kidney disease    related to cancer  . Chronic sinusitis   . Clotting disorder ([place])   . COPD (chronic obstructive pulmonary disease) ([place])   . Depression   . DVT (deep venous thrombosis) ([place]) 09/25/2014  . Endotracheally intubated   . GERD (gastroesophageal reflux disease)   . Hematuria 04/07/2016  . Hyperlipidemia   . Hypertension   . Hypogammaglobulinemia, acquired ([place]) 12/30/2009   Qualifier: Diagnosis of  By: Joya Gaskins MD, Burnett Harry   . Iron deficiency anemia 01/18/2015  . Myocardial infarction ([place]) 2011  . 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 Non Hodgkin's lymphoma ([place])  2011  . Orofacial dyskinesia 04/22/2015  . Orofacial dyskinesia 04/22/2015  . Osteoporosis   . Peripheral neuropathy due to chemotherapy ([place]) 05/02/2016  . Pneumonia 01/2013  . PONV (postoperative nausea and vomiting)   . Respiratory failure ([place])   . Sleep apnea   . 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     is allergic to meperidine hcl and montelukast sodium.  [name] does not currently have medications on file.  SURGICAL HISTORY: Past Surgical History:  Procedure Laterality Date  . ABDOMINAL HYSTERECTOMY     Fibroids  . BASAL CELL CARCINOMA EXCISION  03/2011   scalp  . BREAST SURGERY Right    biopsy  . CATARACT EXTRACTION W/PHACO Left 11/17/2014   Procedure: CATARACT EXTRACTION PHACO AND INTRAOCULAR LENS PLACEMENT LEFT EYE;  Surgeon: Tonny Branch, MD;  Location: [facility];  Service: Ophthalmology;  Laterality: Left;  CDE:5.60  . CATARACT EXTRACTION W/PHACO Right 12/15/2014   Procedure: CATARACT EXTRACTION PHACO AND INTRAOCULAR LENS PLACEMENT RIGHT EYE CDE=5.16;  Surgeon: Tonny Branch, MD;  Location: [facility];  Service: Ophthalmology;  Laterality: Right;  . COLONOSCOPY N/A 11/14/2012  Procedure: COLONOSCOPY;  Surgeon: Rogene Houston, MD;  Location: [facility];  Service: Endoscopy;  Laterality: N/A;  830  . EYE SURGERY    . NASAL SINUS SURGERY    . NECK SURGERY     x 2   . PERIPHERALLY INSERTED CENTRAL  CATHETER INSERTION    . PICC Removal    . PORT-A-CATH REMOVAL    . PORTACATH PLACEMENT  7/11   Removed 6/12  . TRACHEOSTOMY    . VESICOVAGINAL FISTULA CLOSURE W/ TAH      Outpatient Encounter Prescriptions as of 11/12/2014  Medication Sig Note  . acetaminophen (TYLENOL) 500 MG tablet Take 1,000 mg by mouth every 6 (six) hours as needed for pain or fever.    [name] Kitchen acyclovir (ZOVIRAX) 400 MG tablet TAKE ONE TABLET BY MOUTH TWICE DAILY.   [name] Kitchen albuterol (PROVENTIL) (2.5 MG/3ML) 0.083% nebulizer solution Take 2.5 mg by nebulization every 4 (four) hours as needed for wheezing or shortness of breath.   . Armodafinil (NUVIGIL) 150 MG tablet Take 150 mg by mouth daily.   . citalopram (CELEXA) 20 MG tablet Take 20 mg by mouth every morning.     . furosemide (LASIX) 20 MG tablet Take 20 mg by mouth as needed for fluid.  04/02/2014: Received from: External Pharmacy Received Sig:   . gabapentin (NEURONTIN) 300 MG capsule TAKE 1 CAPSULE BY MOUTH FOUR TIMES DAILY.   . mometasone-formoterol (DULERA) 200-5 MCG/ACT AERO Inhale 2 puffs into the lungs 2 (two) times daily.   [name] Kitchen omeprazole (PRILOSEC) 20 MG capsule Take 1 capsule (20 mg total) by mouth 2 (two) times daily before a meal.   . predniSONE (DELTASONE) 10 MG tablet Take 4 for four days 3 for four days 2 for four days then 1 daily and STAY (Patient taking differently: Take 5 mg by mouth daily. )   . simvastatin (ZOCOR) 80 MG tablet Take 80 mg by mouth at bedtime.     [name] Kitchen tiotropium (SPIRIVA) 18 MCG inhalation capsule Place 1 capsule (18 mcg total) into inhaler and inhale daily.   . VENTOLIN HFA 108 (90 BASE) MCG/ACT inhaler INHALE 2 PUFFS INTO THE LUNGS EVERY 6 HOURS AS NEEDED.   [name] Kitchen vitamin C (ASCORBIC ACID) 500 MG tablet Take 500 mg by mouth 2 (two) times daily.   [name] Kitchen warfarin (COUMADIN) 10 MG tablet Take 5 mg by mouth every evening.    . zafirlukast (ACCOLATE) 20 MG tablet TAKE 1 TABLET BY MOUTH TWICE DAILY.   . fluticasone (FLONASE) 50 MCG/ACT nasal spray Place 2  sprays into the nose 2 (two) times daily. (Patient not taking: Reported on 11/12/2014)   . ketorolac (ACULAR) 0.5 % ophthalmic solution  11/12/2014: Received from: External Pharmacy  . levofloxacin (LEVAQUIN) 250 MG tablet Take 2 tablets on first day then one tablet daily therafter for a total of 14 days   . ofloxacin (OCUFLOX) 0.3 % ophthalmic solution  11/12/2014: Received from: External Pharmacy  . prednisoLONE acetate (PRED FORTE) 1 % ophthalmic suspension  11/12/2014: Received from: External Pharmacy   No facility-administered encounter medications on file as of 11/12/2014.    SOCIAL HISTORY: Social History   Social History  . Marital status: Divorced    Spouse name: N/A  . Number of children: 3  . Years of education: 12   Occupational History  . teller     [employer] History Main Topics  . Smoking status: Never Smoker  . Smokeless tobacco: Never Used  .  Alcohol use No  . Drug use: No  . Sexual activity: No     Comment: hyst   Other Topics Concern  . Not on file   Social History Narrative   Originally from [place]. Always lived in [place]. Prior travel to [place]. 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 She has 1 dog currently. She has 1 conures (small parrots). No mold exposure in her home. 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 No hot tub exposure. She enjoys sewing & quilting.     FAMILY HISTORY: Family History  Problem Relation Age of Onset  . Emphysema Mother   . Stroke Mother   . COPD Mother   . Heart disease Mother        died in sleep  74  . Allergies Father   . Asthma Father        as a child  . Arthritis Father   . Parkinson's disease Father 60  . Leukemia Maternal Grandmother   . Cancer Maternal Grandmother        Leukemia  . Diabetes Brother   . Heart attack Brother   . Hypertension Brother   . Heart disease Brother 40       stents  . Hypertension Sister   .  Hypertension Brother     Review of Systems  Constitutional: Negative.  Negative for diaphoresis, fever and weight loss.       No recent infections  HENT: Negative.   Eyes: Negative.   Respiratory: Negative for shortness of breath.   Cardiovascular: Negative.  Negative for chest pain.  Gastrointestinal: Negative for abdominal pain, diarrhea, nausea and vomiting.  Genitourinary: Negative.   Musculoskeletal: Negative.   Skin: Negative.  Negative for rash.  Neurological: Negative.   Endo/Heme/Allergies: Negative.   Psychiatric/Behavioral: Negative.   All other systems reviewed and are negative.   14 point review of systems was performed and is negative except as detailed under history of present illness and above  PHYSICAL EXAMINATION  ECOG PERFORMANCE STATUS: 0 - Asymptomatic  Vitals with BMI 05/17/2016  Height   Weight 142 lbs 3 oz  BMI   Systolic 469  Diastolic 73  Pulse 89  Respirations 18   Physical Exam  Constitutional: She is oriented to person, place, and time and well-developed, well-nourished, and in no distress.  HENT:  Head: Normocephalic and atraumatic.  Eyes: Pupils are equal, round, and reactive to light. EOM are normal.  Neck: Normal range of motion. Neck supple.  Cardiovascular: Normal rate, regular rhythm and normal heart sounds.  Exam reveals no gallop and no friction rub.   No murmur heard. Pulmonary/Chest: Effort normal. No respiratory distress. She has wheezes (Mild expiratory wheezing). She has no rales.  Abdominal: Soft. Bowel sounds are normal. She exhibits no distension. There is no tenderness. There is no rebound and no guarding.  Musculoskeletal: Normal range of motion.  Neurological: She is alert and oriented to person, place, and time. Gait normal.  Skin: Skin is warm and dry.  Nursing note and vitals reviewed.  LABORATORY DATA: I have reviewed the data as listed.  CBC    Component Value Date/Time   WBC 11.5 (H) 08/09/2016 0858   RBC  4.17 08/09/2016 0858   HGB 12.5 08/09/2016 0858   HCT 39.9 08/09/2016 0858   PLT 186 08/09/2016 0858   MCV 95.7 08/09/2016 0858   MCH 30.0 08/09/2016 0858  MCHC 31.3 08/09/2016 0858   RDW 14.2 08/09/2016 0858   LYMPHSABS 1.2 08/09/2016 0858   MONOABS 0.7 08/09/2016 0858   EOSABS 0.1 08/09/2016 0858   BASOSABS 0.0 08/09/2016 0858   CMP     Component Value Date/Time   NA 137 08/09/2016 0858   K 3.5 08/09/2016 0858   CL 96 (L) 08/09/2016 0858   CO2 31 08/09/2016 0858   GLUCOSE 118 (H) 08/09/2016 0858   BUN 10 08/09/2016 0858   CREATININE 0.97 08/09/2016 0858   CALCIUM 9.0 08/09/2016 0858   PROT 7.0 08/09/2016 0858   ALBUMIN 3.5 08/09/2016 0858   AST 26 08/09/2016 0858   ALT 18 08/09/2016 0858   ALKPHOS 84 08/09/2016 0858   BILITOT 0.5 08/09/2016 0858   GFRNONAA >60 08/09/2016 0858   GFRAA >60 08/09/2016 0858     RADIOLOGY:  Mammogram Screening Bilateral 02/26/2015 IMPRESSION: No mammographic evidence of malignancy. A result letter of this screening mammogram will be mailed directly to the patient.  ASSESSMENT and THERAPY PLAN:   1.Hypogammaglobulinemia Asthma  Doing well on IVIG without any recurrent or frequent infections. She will continue with IVIG.  I will see her back in 4 months.  She continues to follow with pulmonary. Encouraged her to use her inhaler.  2. Diffuse large B-cell lymphoma  She remains without evidence of obvious recurrence. We will continue ongoing observation. I previously advised her that her disease was in 2011 and at this point she is unlikely to have a recurrence.    NCCN guidelines recommends the follow surveillance for Stage I, II for those who asertain a complete response in the first-line treatment setting (3.2017):  A. H&P every 3-6 months for 5 years, then yearly or as clinically indicated.  B. Labs every 3-6 months for 5 years and then annually or as clinically indicated.  C. Imaging only as indicated. For those ascertaining a  complete response in the Stage III, IV setting in first-line treatment setting:  A. H&P every 3-6 months for 5 years, then yearly or as clinically indicated.  B. Labs every 3-6 months for 5 years and then annually or as clinically indicated.  C. CT C/A/P with contrast no more than every 6 months for 2 years after  completion of treatment; then only as indicated.   Anemia with h/o iron deficiency- improved Colonoscopy in 2014 with multiple polyps Ongoing coumadin use  PLAN: Clinically doing well. No evidence of recurrence of her DLBCL.   Continue IVIG for IgG deficiency. Repeat labs with CBC, CMP, LDH, immunoglobulins at next visit.   Return to clinic in 4 months for follow up.   Orders Placed This Encounter  Procedures  . CBC with Differential    Standing Status:   Future    Standing Expiration Date:   08/09/2017  . Comprehensive metabolic panel    Standing Status:   Future    Standing Expiration Date:   08/09/2017  . IgG, IgA, IgM    Standing Status:   Future    Standing Expiration Date:   08/09/2017    All questions were answered. The patient knows to call the clinic with any problems, questions or concerns. We can certainly see the patient much sooner if necessary.    This note was signed electronically by:  Twana First, MD 08/09/16

## 2016-08-09 NOTE — Progress Notes (Signed)
[name] tolerated IVIG infusion well without complaints or incident. VSS upon discharge. Pt discharged self ambulatory in satisfactory condition

## 2016-08-10 LAB — IGG, IGA, IGM
IGM, SERUM: 90 mg/dL (ref 26–217)
IgA: 11 mg/dL — ABNORMAL LOW (ref 87–352)
IgG (Immunoglobin G), Serum: 1049 mg/dL (ref 700–1600)

## 2016-08-15 DIAGNOSIS — G4733 Obstructive sleep apnea (adult) (pediatric): Secondary | ICD-10-CM | POA: Diagnosis not present

## 2016-08-15 DIAGNOSIS — Z79899 Other long term (current) drug therapy: Secondary | ICD-10-CM | POA: Diagnosis not present

## 2016-08-15 DIAGNOSIS — E1140 Type 2 diabetes mellitus with diabetic neuropathy, unspecified: Secondary | ICD-10-CM | POA: Diagnosis not present

## 2016-08-15 DIAGNOSIS — G47419 Narcolepsy without cataplexy: Secondary | ICD-10-CM | POA: Diagnosis not present

## 2016-08-25 ENCOUNTER — Ambulatory Visit (INDEPENDENT_AMBULATORY_CARE_PROVIDER_SITE_OTHER): Payer: Medicare Other | Admitting: Pulmonary Disease

## 2016-08-25 ENCOUNTER — Encounter: Payer: Self-pay | Admitting: Pulmonary Disease

## 2016-08-25 VITALS — BP 106/58 | HR 76 | Ht 62.0 in | Wt 145.2 lb

## 2016-08-25 DIAGNOSIS — J329 Chronic sinusitis, unspecified: Secondary | ICD-10-CM

## 2016-08-25 DIAGNOSIS — R05 Cough: Secondary | ICD-10-CM | POA: Diagnosis not present

## 2016-08-25 DIAGNOSIS — J4540 Moderate persistent asthma, uncomplicated: Secondary | ICD-10-CM | POA: Diagnosis not present

## 2016-08-25 DIAGNOSIS — R059 Cough, unspecified: Secondary | ICD-10-CM

## 2016-08-25 NOTE — Patient Instructions (Signed)
   Call me if you have any new breathing problems or questions before your next appointment.  I will see you back in 6 months or sooner if needed.  TESTS ORDERED: 1. Sputum culture for AFB, Fungus, & Bacteria [facility])

## 2016-08-25 NOTE — Progress Notes (Signed)
Subjective:    Patient ID: [name], female    DOB: 1955/08/21, 61 y.o.   MRN: [id number]  C.C.:  Follow-up for Moderate, Persistent Asthma w/ Severe Fixed Airway Obstruction, Chronic Sinusitis, GERD, Chronic Steroid Use, OSA/Narcolepsy, & DVT w/ SVC Syndrome.  HPI Moderate, persistent asthma: Patient has severe fixed airways obstruction on spirometry. On treatment with Dulera, Spiriva, and zafirlukast. She reports the Z-pak did seem to clear up her cough but then her cough recurred. She is producing a yellow-green mucus still. She reports she did provide a sputum specimen but this was never resulted to me. She is wheezing some. No exacerbations since her last appointment. No nocturnal coughing.   Chronic sinusitis: On Zestril, Flonase, zafirlukast, and prednisone. She has continued sinus drainage. No new sinus pressure or pain.   GERD: Prescribed Prilosec. No reflux or dyspepsia. No morning brash water taste.   Chronic steroid use: Prednisone therapy for years. Basal dose 5 mg daily. Known osteoporosis on DEXA. PCP managing. Patient has been on prednisone 5 mg daily for years. Known osteoporosis on DEXA.  OSA/narcolepsy: Managed by outside physician. On CPAP therapy.  DVT & SVC syndrome: Secondary to clot around her Port-A-Cath in 2012. Followed by hematology and on chronic anticoagulation with Coumadin.   Review of Systems No fever or chills recently. No sweats recently. No chest pain or pressure. No rashes and bruises easily on Coumadin.   Allergies  Allergen Reactions  . Meperidine Hcl Anaphylaxis  . Montelukast Sodium Hives and Rash   Current Outpatient Prescriptions on File Prior to Visit  Medication Sig Dispense Refill  . acetaminophen (TYLENOL) 500 MG tablet Take 1,000 mg by mouth every 6 (six) hours as needed for pain or fever.     [name] Kitchen acyclovir (ZOVIRAX) 400 MG tablet TAKE ONE TABLET BY MOUTH TWICE DAILY. 60 tablet 2  . albuterol (PROAIR HFA) 108 (90 Base) MCG/ACT inhaler  Inhale 2 puffs into the lungs every 6 (six) hours as needed. 8.5 g 2  . albuterol (PROVENTIL) (2.5 MG/3ML) 0.083% nebulizer solution Take 2.5 mg by nebulization every 4 (four) hours as needed for wheezing or shortness of breath.    [name] Kitchen alendronate (FOSAMAX) 70 MG tablet Take 1 tablet (70 mg total) by mouth once a week. Take with a full glass of water on an empty stomach. 12 tablet 3  . amphetamine-dextroamphetamine (ADDERALL) 10 MG tablet Take 10 mg by mouth daily.  0  . Armodafinil (NUVIGIL) 150 MG tablet Take 150 mg by mouth daily.    . citalopram (CELEXA) 20 MG tablet Take 20 mg by mouth every morning.      . cyanocobalamin 1000 MCG tablet Take 1,000 mcg by mouth daily.    . fluticasone (FLONASE) 50 MCG/ACT nasal spray   10  . furosemide (LASIX) 20 MG tablet Take 1 tablet (20 mg total) by mouth as needed for fluid. 90 tablet 1  . gabapentin (NEURONTIN) 300 MG capsule TAKE 1 CAPSULE BY MOUTH FOUR TIMES DAILY. 120 capsule 3  . mometasone-formoterol (DULERA) 200-5 MCG/ACT AERO Inhale 2 puffs into the lungs 2 (two) times daily. 3 Inhaler 4  . omeprazole (PRILOSEC) 20 MG capsule TAKE ONE CAPSULE BY MOUTH TWICE DAILY BEFORE A MEAL. 180 capsule 1  . potassium chloride SA (K-DUR,KLOR-CON) 20 MEQ tablet Take 1 tablet (20 mEq total) by mouth 2 (two) times daily. 60 tablet 0  . simvastatin (ZOCOR) 80 MG tablet Take 80 mg by mouth at bedtime.      [name] Kitchen  tiotropium (SPIRIVA) 18 MCG inhalation capsule Place 1 capsule (18 mcg total) into inhaler and inhale daily. 30 capsule 3  . zafirlukast (ACCOLATE) 20 MG tablet Take 1 tablet (20 mg total) by mouth 2 (two) times daily. 60 tablet 11   Current Facility-Administered Medications on File Prior to Visit  Medication Dose Route Frequency Provider Last Rate Last Dose  . 0.9 %  sodium chloride infusion   Intravenous Once Kefalas, Thomas S, PA-C      . 0.9 %  sodium chloride infusion   Intravenous Once Kefalas, Thomas S, PA-C      . acetaminophen (TYLENOL) tablet 650 mg   650 mg Oral Q6H PRN Kefalas, Manon Hilding, PA-C      . dextrose 5 % solution   Intravenous Continuous Kefalas, Manon Hilding, PA-C   Stopped at 08/25/15 1400  . dextrose 5 % solution   Intravenous Continuous Baird Cancer, PA-C   Stopped at 07/13/16 1350  . diphenhydrAMINE (BENADRYL) capsule 25 mg  25 mg Oral Once Kefalas, Thomas S, PA-C      . sodium chloride 0.9 % injection 10 mL  10 mL Intracatheter PRN Baird Cancer, PA-C   10 mL at 08/25/15 1125   Past Medical History:  Diagnosis Date  . Allergic rhinitis   . Allergy   . Anemia   . Anxiety   . Asthma   . B12 deficiency 01/18/2015  . Blood transfusion without reported diagnosis   . Cataract   . Cavitary lung disease   . Chronic kidney disease    related to cancer  . Chronic sinusitis   . Clotting disorder ([place])   . COPD (chronic obstructive pulmonary disease) ([place])   . Depression   . DVT (deep venous thrombosis) ([place]) 09/25/2014  . Endotracheally intubated   . GERD (gastroesophageal reflux disease)   . Hematuria 04/07/2016  . Hyperlipidemia   . Hypertension   . Hypogammaglobulinemia, acquired ([place]) 12/30/2009   Qualifier: Diagnosis of  By: Joya Gaskins MD, Burnett Harry   . Iron deficiency anemia 01/18/2015  . Myocardial infarction ([place]) 2011  . 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 Non Hodgkin's lymphoma ([place]) 2011  . Orofacial dyskinesia 04/22/2015  . Orofacial dyskinesia 04/22/2015  . Osteoporosis   . Peripheral neuropathy due to chemotherapy ([place]) 05/02/2016  . Pneumonia 01/2013  . PONV (postoperative nausea and vomiting)   . Respiratory failure ([place])   . Sleep apnea   . SVC syndrome 09/25/2014   Past Surgical History:  Procedure Laterality Date  . ABDOMINAL HYSTERECTOMY     Fibroids  . BASAL CELL CARCINOMA EXCISION  03/2011   scalp  . BREAST SURGERY Right    biopsy  . CATARACT EXTRACTION W/PHACO Left 11/17/2014   Procedure: CATARACT EXTRACTION PHACO AND INTRAOCULAR LENS PLACEMENT LEFT EYE;  Surgeon: Tonny Branch,  MD;  Location: [facility];  Service: Ophthalmology;  Laterality: Left;  CDE:5.60  . CATARACT EXTRACTION W/PHACO Right 12/15/2014   Procedure: CATARACT EXTRACTION PHACO AND INTRAOCULAR LENS PLACEMENT RIGHT EYE CDE=5.16;  Surgeon: Tonny Branch, MD;  Location: [facility];  Service: Ophthalmology;  Laterality: Right;  . COLONOSCOPY N/A 11/14/2012   Procedure: COLONOSCOPY;  Surgeon: Rogene Houston, MD;  Location: [facility] SUITE;  Service: Endoscopy;  Laterality: N/A;  830  . EYE SURGERY    . NASAL SINUS SURGERY    . NECK SURGERY     x 2   . PERIPHERALLY INSERTED CENTRAL CATHETER INSERTION    . PICC Removal    .  PORT-A-CATH REMOVAL    . PORTACATH PLACEMENT  7/11   Removed 6/12  . TRACHEOSTOMY    . VESICOVAGINAL FISTULA CLOSURE W/ TAH     Family History  Problem Relation Age of Onset  . Emphysema Mother   . Stroke Mother   . COPD Mother   . Heart disease Mother        died in sleep  60  . Allergies Father   . Asthma Father        as a child  . Arthritis Father   . Parkinson's disease Father 2  . Leukemia Maternal Grandmother   . Cancer Maternal Grandmother        Leukemia  . Diabetes Brother   . Heart attack Brother   . Hypertension Brother   . Heart disease Brother 40       stents  . Hypertension Sister   . Hypertension Brother    Social History   Social History  . Marital status: Divorced    Spouse name: N/A  . Number of children: 3  . Years of education: 12   Occupational History  . teller     [place] History Main Topics  . Smoking status: Never Smoker  . Smokeless tobacco: Never Used  . Alcohol use No  . Drug use: No  . Sexual activity: No     Comment: hyst   Other Topics Concern  . None   Social History Narrative   Originally from [place]. Always lived in [place]. Prior travel to [place]. 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 She has 1 dog currently. She has 1 conures (small parrots). No mold exposure  in her home. 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 No hot tub exposure. She enjoys sewing & quilting.       Objective:   Physical Exam BP (!) 106/58 (BP Location: Left Arm, Patient Position: Sitting, Cuff Size: Normal)   Pulse 76   Ht 5\' 2"  (1.575 m)   Wt 145 lb 3.2 oz (65.9 kg)   SpO2 91%   BMI 26.56 kg/m   General:  Awake. Alert. Mild central obesity. Integument:  Warm & dry. No rash on exposed skin. Bruising of various ages on bilateral upper extremities. Lymphatics: No appreciated cervical or supraclavicular lymphadenopathy. HEENT:  Moist mucus membranes. Minimal nasal turbinate swelling. No oral ulcers. Cardiovascular:  Regular rate. No edema. Unable to appreciate JVD.  Pulmonary:  Good aeration bilaterally. Mild coarse wheeze less than previous. Normal work of breathing on room air. Abdomen: Soft. Normal bowel sounds. Mildly protuberant. Musculoskeletal:  Normal bulk and tone. No joint deformity or effusion appreciated.  PFT 04/30/15: FVC 1.98 L (64%) FEV1 1.31 L (55%) FEV1/FVC 0.66 FEF 25-75 0.76 L (34%) no bronchodilator response                                                   DLCO corrected 87% 10/08/14: FVC 1.68 L (54%) FEV1 1.14 L (47%) FEV1/FVC 0.67 FEF 25-75 0.75 L (33%) no bronchodilator response TLC 5.16 L (108%) ERV 11% DLCO uncorrected 69%  PSG Split Night Study (05/25/12): AHI 14.7 events/hour. ResMed Quattro Full Face. 15cm H2O. Baseline Sat 90% & Lowest Sat 75%.  IMAGING CXR PA/LAT 05/20/16 (  personally reviewed by me):  No focal opacity, mass, or pleural effusion. Heart: Size & mediastinum normal in contour.  MAXILLOFACIAL CT W/O 01/30/15 (per radiologist): Fluid in both maxillary sinuses more so in the right. Layering fluid in the sphenoid sinuses. Bilateral ethmoidectomy. Findings consistent with acute on chronic sinusitis.  DEXA (01/30/15): Right femur neck T score -2.5 consistent with osteoporosis.  BARIUM SWALLOW (10/01/14): Normal  esophageal motility. No mass or stricture. No hiatal hernia or reflux.  VENOUS DUPLEX LLE (06/26/14): No evidence of DVT  CT CHEST W/O 01/16/14 (previously reviewed by me): New right lower lobe reticulonodular infiltrate. Linear densities right upper lobe. Resolution of prior left lower lobe opacity. No pathologic mediastinal adenopathy. No pleural effusion or thickening. No pericardial effusion. Minimal esophageal thickening.   CARDIAC TTE (11/02/09): LV EF 30-35% w/ diffuse hypokinesis. Normal wall thickness. RA normal in size. RV normal in size. Pulmonary artery poorly visualized. RVSP 48mmHg. No AS or AR. No MS w/ moderate MR. Tricuspid valve poorly visualized.  MICROBIOLOGY SPUTUM (04/29/10):  M. catarrahlis  TRACH ASPIRATE (11/23/09):  P. aeruginosa BAL (11/15/09):  Bacteria & AFB negative  LABS 01/21/15 Alpha-1 antitrypsin: MM (138)  09/25/14 IgE: 5 Aspergillus antigen:  0.25  08/12/14 CBC: 6.8/10.3/34.3/184 BMP: 141/3.7/102/29/16/1.15/91/8.7 LFT: 3.5/6.5/0.5/71/17/12  IgG: 858 IgA: 44 IgM: 94  INR: 2.61    Assessment & Plan:  61 y.o. female with Moderate persistent asthma with fixed airway obstruction, chronic sinusitis, and GERD. Patient on chronic prednisone therapy. Her previous x-ray showed no focal opacity or cause for her ongoing cough. However, I am concerned about the possibility for an atypical infection/colonizer given her symptomatic response to azithromycin. As such, I am repeating her sputum culture today. I am not adjusting her current regimen for treatment of underlying asthma, sinusitis, and GERD given her relatively stable clinical state. I instructed the patient contact me if she had any new breathing problems or questions before her next appointment.  1. Moderate, persistent asthma:  Checking sputum culture for AFB, fungus, and bacteria. Continuing patient on Spiriva, Dulera, prednisone, and zafirlukast. No changes at this time. 2. Chronic sinusitis:  Continuing patient on prednisone, Zestril, and Flonase. 3. GERD: Controlled with Prilosec. No changes. 4. Health maintenance:  Status post Prevnar vaccine August 2017, Pneumovax October 2013 & Tdap April 2015. 5. Follow-up: Return to clinic in 6 months or sooner if needed.  Sonia Baller Ashok Cordia, M.D. [facility] Pager:  [phone] After 3pm or if no response, call [phone] 4:11 PM 08/25/16

## 2016-08-30 ENCOUNTER — Other Ambulatory Visit (HOSPITAL_COMMUNITY)
Admission: RE | Admit: 2016-08-30 | Discharge: 2016-08-30 | Disposition: A | Discharge status: Home / Self Care | Payer: Medicare Other | Source: Ambulatory Visit | Attending: Pulmonary Disease | Admitting: Pulmonary Disease

## 2016-08-30 ENCOUNTER — Telehealth: Payer: Self-pay | Admitting: Pulmonary Disease

## 2016-08-30 DIAGNOSIS — R05 Cough: Secondary | ICD-10-CM | POA: Insufficient documentation

## 2016-08-30 DIAGNOSIS — R059 Cough, unspecified: Secondary | ICD-10-CM

## 2016-08-30 NOTE — Telephone Encounter (Signed)
Was probably entered in error.

## 2016-08-30 NOTE — Telephone Encounter (Signed)
Looked in patient's last OV note, I did not see any mentioning of pulmonary rehab.   JN, please advise if the she needs an order for pulm rehab. Thanks!

## 2016-08-30 NOTE — Telephone Encounter (Signed)
Called and spoke with Vinnie Level at the lab and she is aware of [facility] for the other orders.  Nothing further is needed.

## 2016-09-01 LAB — CULTURE, RESPIRATORY W GRAM STAIN

## 2016-09-01 LAB — CULTURE, RESPIRATORY

## 2016-09-02 ENCOUNTER — Other Ambulatory Visit: Payer: Self-pay | Admitting: *Deleted

## 2016-09-02 MED ORDER — AZITHROMYCIN 250 MG PO TABS: ORAL_TABLET | ORAL | 0 refills | Status: AC

## 2016-09-09 ENCOUNTER — Other Ambulatory Visit (HOSPITAL_COMMUNITY): Payer: Medicare Other

## 2016-09-09 ENCOUNTER — Ambulatory Visit (HOSPITAL_COMMUNITY): Payer: Medicare Other

## 2016-09-12 ENCOUNTER — Other Ambulatory Visit (HOSPITAL_COMMUNITY): Payer: Self-pay | Admitting: *Deleted

## 2016-09-12 DIAGNOSIS — C8338 Diffuse large B-cell lymphoma, lymph nodes of multiple sites: Secondary | ICD-10-CM

## 2016-09-13 ENCOUNTER — Encounter (HOSPITAL_COMMUNITY): Payer: Self-pay

## 2016-09-13 ENCOUNTER — Encounter (HOSPITAL_COMMUNITY): Payer: Medicare Other | Attending: Oncology

## 2016-09-13 ENCOUNTER — Encounter (HOSPITAL_BASED_OUTPATIENT_CLINIC_OR_DEPARTMENT_OTHER): Payer: Medicare Other

## 2016-09-13 VITALS — BP 155/71 | HR 76 | Temp 98.4°F | Resp 18

## 2016-09-13 DIAGNOSIS — C8338 Diffuse large B-cell lymphoma, lymph nodes of multiple sites: Secondary | ICD-10-CM | POA: Diagnosis present

## 2016-09-13 DIAGNOSIS — D801 Nonfamilial hypogammaglobulinemia: Secondary | ICD-10-CM | POA: Diagnosis not present

## 2016-09-13 LAB — COMPREHENSIVE METABOLIC PANEL
ALBUMIN: 3.5 g/dL (ref 3.5–5.0)
ALT: 13 U/L — ABNORMAL LOW (ref 14–54)
ANION GAP: 7 (ref 5–15)
AST: 18 U/L (ref 15–41)
Alkaline Phosphatase: 75 U/L (ref 38–126)
BUN: 12 mg/dL (ref 6–20)
CHLORIDE: 99 mmol/L — AB (ref 101–111)
CO2: 31 mmol/L (ref 22–32)
Calcium: 8.9 mg/dL (ref 8.9–10.3)
Creatinine, Ser: 0.93 mg/dL (ref 0.44–1.00)
GFR calc Af Amer: >60 mL/min (ref >60)
GFR calc non Af Amer: >60 mL/min (ref >60)
GLUCOSE: 89 mg/dL (ref 65–99)
POTASSIUM: 3.3 mmol/L — AB (ref 3.5–5.1)
SODIUM: 137 mmol/L (ref 135–145)
TOTAL PROTEIN: 6.8 g/dL (ref 6.5–8.1)
Total Bilirubin: 0.7 mg/dL (ref 0.3–1.2)

## 2016-09-13 LAB — CBC WITH DIFFERENTIAL/PLATELET
BASOS ABS: 0 10*3/uL (ref 0.0–0.1)
BASOS PCT: 0 %
EOS ABS: 0.1 10*3/uL (ref 0.0–0.7)
Eosinophils Relative: 1 %
HCT: 37.3 % (ref 36.0–46.0)
Hemoglobin: 11.9 g/dL — ABNORMAL LOW (ref 12.0–15.0)
Lymphocytes Relative: 15 %
Lymphs Abs: 1.5 10*3/uL (ref 0.7–4.0)
MCH: 30.2 pg (ref 26.0–34.0)
MCHC: 31.9 g/dL (ref 30.0–36.0)
MCV: 94.7 fL (ref 78.0–100.0)
MONO ABS: 1 10*3/uL (ref 0.1–1.0)
MONOS PCT: 10 %
NEUTROS ABS: 7.4 10*3/uL (ref 1.7–7.7)
NEUTROS PCT: 74 %
PLATELETS: 190 10*3/uL (ref 150–400)
RBC: 3.94 MIL/uL (ref 3.87–5.11)
RDW: 14.1 % (ref 11.5–15.5)
WBC: 10 10*3/uL (ref 4.0–10.5)

## 2016-09-13 LAB — LACTATE DEHYDROGENASE: LDH: 173 U/L (ref 98–192)

## 2016-09-13 MED ORDER — DEXTROSE 5 % IV SOLN
INTRAVENOUS | Status: DC
  Administered 2016-09-13: 09:00:00 via INTRAVENOUS

## 2016-09-13 MED ORDER — IMMUNE GLOBULIN (HUMAN) 10 GM/100ML IV SOLN
50.0000 g | Freq: Once | INTRAVENOUS | Status: AC
  Administered 2016-09-13: 50 g via INTRAVENOUS
  Filled 2016-09-13: qty 400

## 2016-09-13 NOTE — Patient Instructions (Signed)
[name] at [facility] Discharge Instructions  RECOMMENDATIONS MADE BY THE CONSULTANT AND ANY TEST RESULTS WILL BE SENT TO YOUR REFERRING PHYSICIAN.  IVIG given today Follow up as scheduled.  Thank you for choosing [place] at [facility]: [place] [name].  To afford each patient quality time with our provider, please arrive at least 15 minutes before your scheduled appointment time.    If you have a lab appointment with the [facility] please come in thru the  Main Entrance and check in at the main information desk  You need to re-schedule your appointment should you arrive 10 or more minutes late.  We strive to give you quality time with our providers, and arriving late affects you and other patients whose appointments are after yours.  Also, if you no show three or more times for appointments you may be dismissed from the clinic at the providers discretion.     Again, thank you for choosing [facility].  Our hope is that these requests will decrease the amount of time that you wait before being seen by our physicians.       _____________________________________________________________  Should you have questions after your visit to [facility], please contact our office at [phone] between the hours of 8:30 a.m. and 4:30 p.m.  Voicemails left after 4:30 p.m. will not be returned until the following business day.  For prescription refill requests, have your pharmacy contact our office.       Resources For Cancer Patients and their Caregivers ? American Cancer Society: Can assist with transportation, wigs, general needs, runs Look Good Feel Better.        417-226-4930 ? Cancer [name]: Provides financial assistance, online support groups, medication/co-pay assistance.  1-800-813-HOPE (629)623-9600) ? [place] Assists [place] cancer patients and their  families through emotional , educational and financial support.  713-637-5140 ? [place] DSS Where to apply for food stamps, Medicaid and utility assistance. 671 840 1277 ? [facility]: Transportation to medical appointments. 936-467-4510 ? Social Security Administration: May apply for disability if have a Stage IV cancer. (424) 236-9398 (470)580-9027 ? [employer], Disability and Transit Services: Assists with nutrition, [name] and transit needs. [place] Support Programs: @10RELATIVEDAYS @ > Cancer Support Group  2nd Tuesday of the month 1pm-2pm, Journey Room  > Creative Journey  3rd Tuesday of the month 1130am-1pm, Journey Room  > Look Good Feel Better  1st Wednesday of the month 10am-12 noon, Journey Room (Call [place] to register [phone])

## 2016-09-13 NOTE — Progress Notes (Signed)
Patient took her own tylenol and benadryl at home before she came.  Treatment given per orders. Patient tolerated it well without problems. Vitals stable and discharged home from clinic ambulatory. Follow up as scheduled.

## 2016-09-14 LAB — IGG, IGA, IGM
IGG (IMMUNOGLOBIN G), SERUM: 1023 mg/dL (ref 700–1600)
IgA: 10 mg/dL — ABNORMAL LOW (ref 87–352)
IgM (Immunoglobulin M), Srm: 74 mg/dL (ref 26–217)

## 2016-09-21 IMAGING — CT CT PARANASAL SINUSES LIMITED
1 of 2 series · 13 of 30 positions shown, 17 images · non-contrast
Comparison: 04/04/2014

CLINICAL DATA: Hodgkin's lymphoma.  Chronic sinusitis.

EXAM:
CT PARANASAL SINUS LIMITED WITHOUT CONTRAST
TECHNIQUE: Non-contiguous multidetector CT images of the paranasal sinuses were
obtained in a single plane without contrast.

[Series 2: sinus ltd 1.8 u70u · axial · 0.31mm/px · z∈[+1242,+1362]mm · 13 of 28 slices shown, 17 images]
[im 2/28  brain]
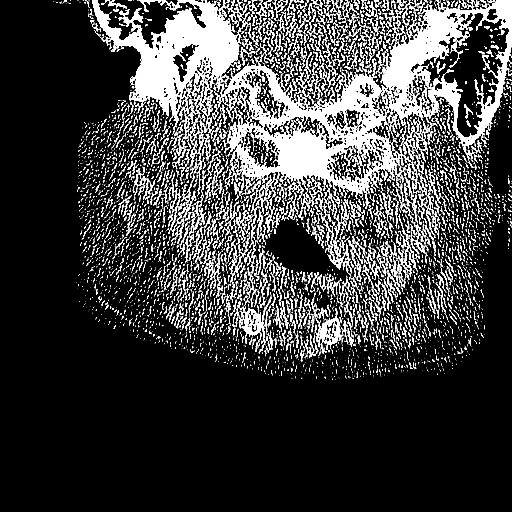
[im 2/28  bone]
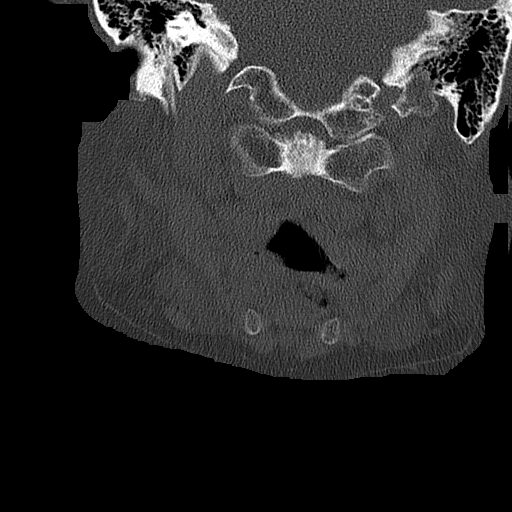
[im 4/28  bone]
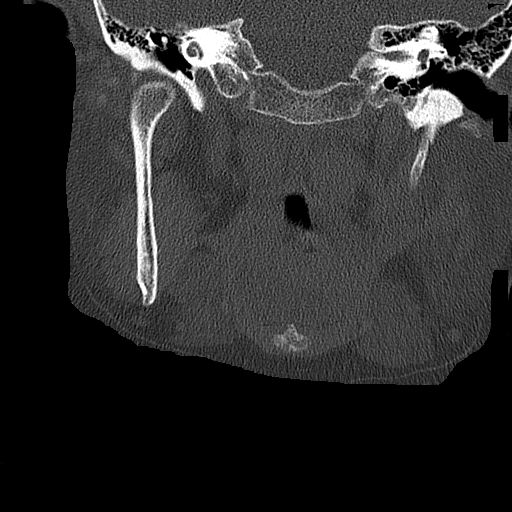
[im 6/28  bone]
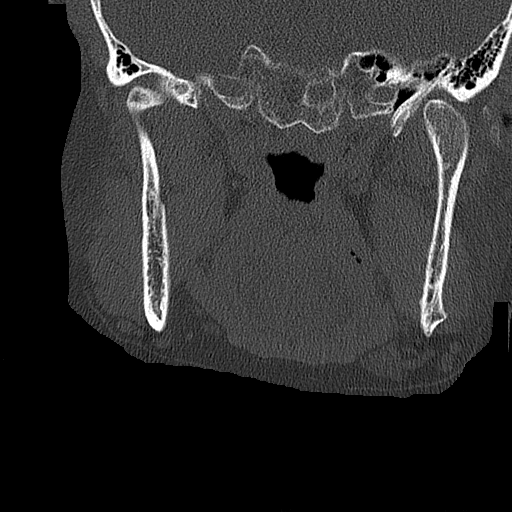
[im 8/28  bone]
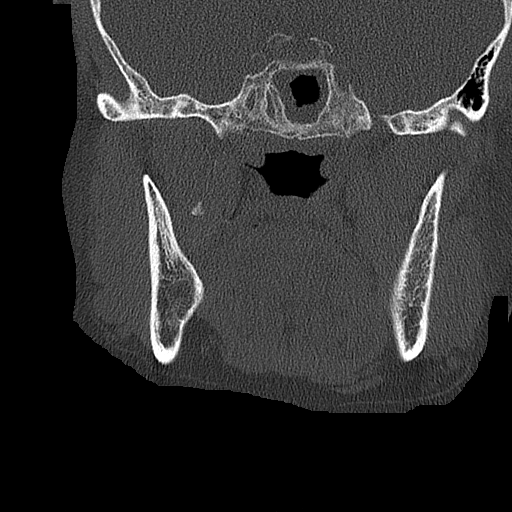
[im 10/28  brain]
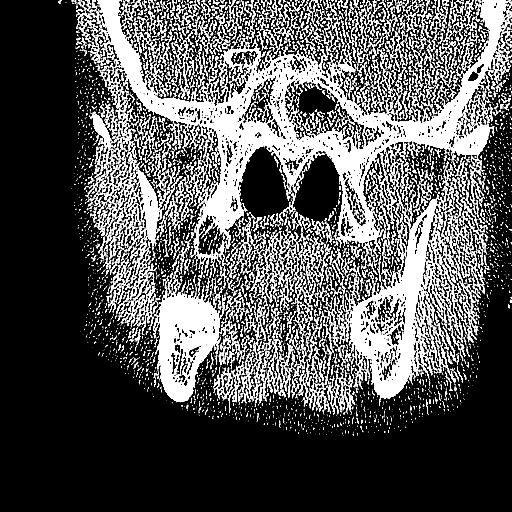
[im 10/28  bone]
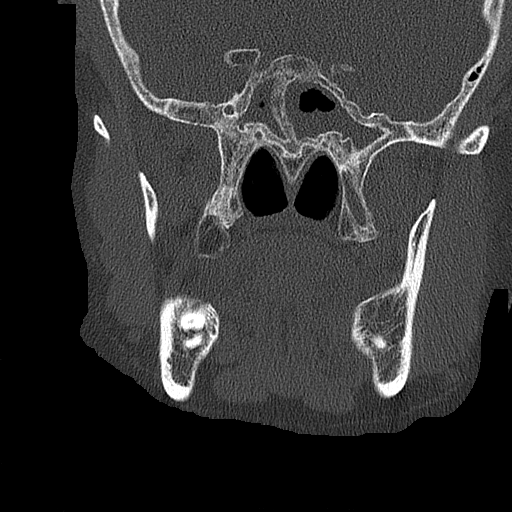
[im 12/28  bone]
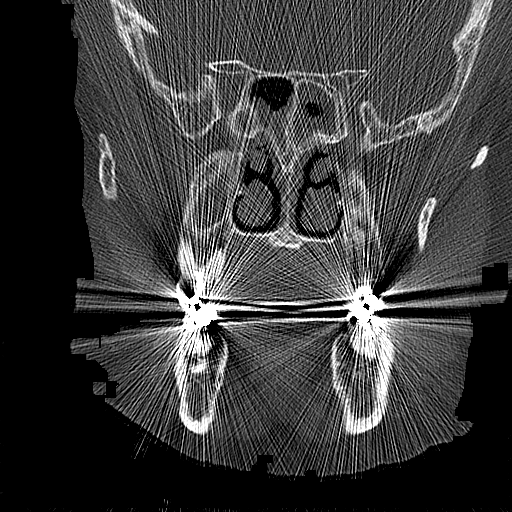
[im 14/28  bone]
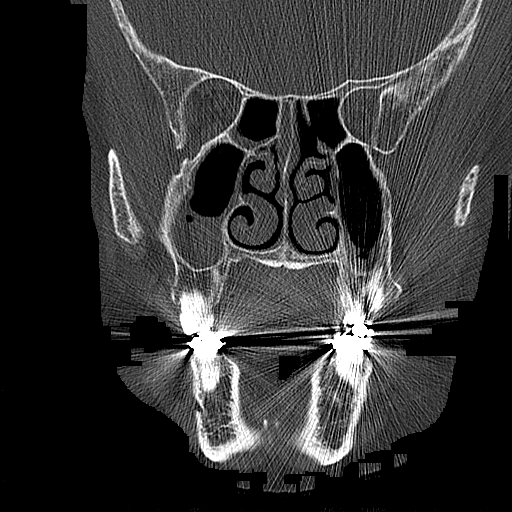
[im 16/28  bone]
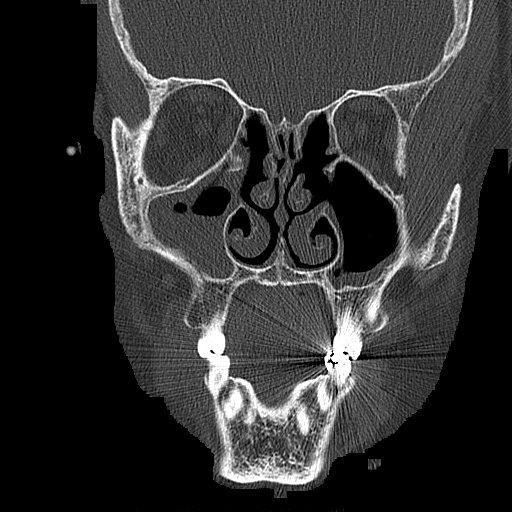
[im 18/28  brain]
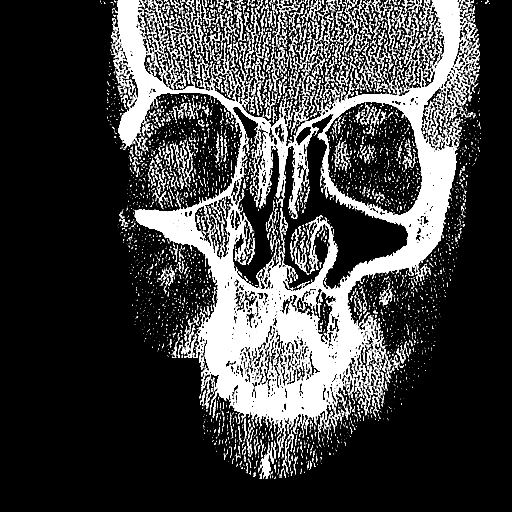
[im 18/28  bone]
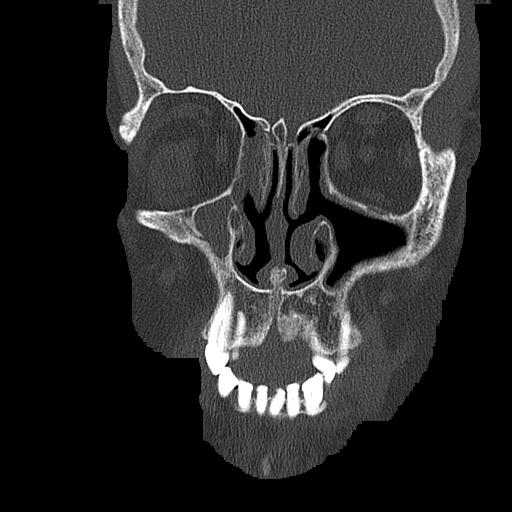
[im 20/28  bone]
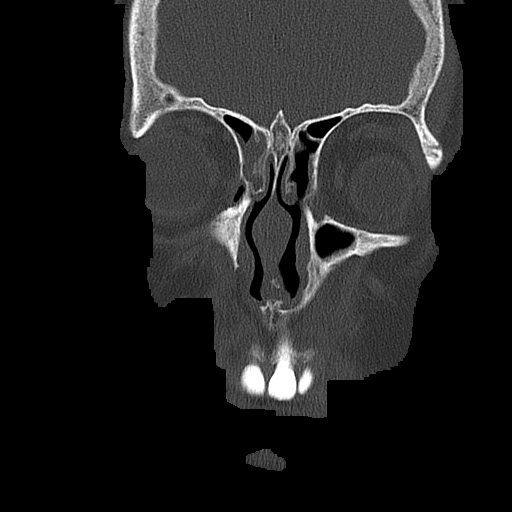
[im 22/28  bone]
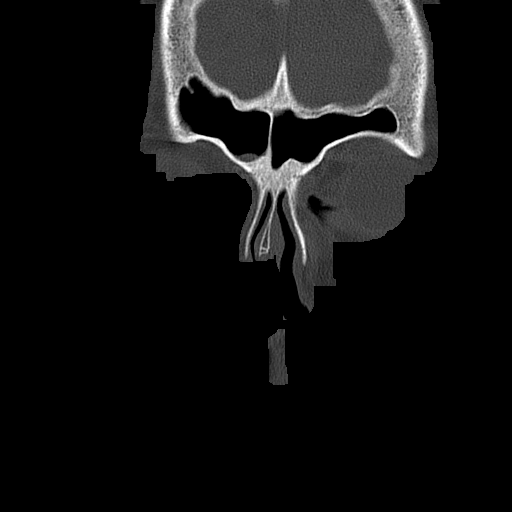
[im 24/28  bone]
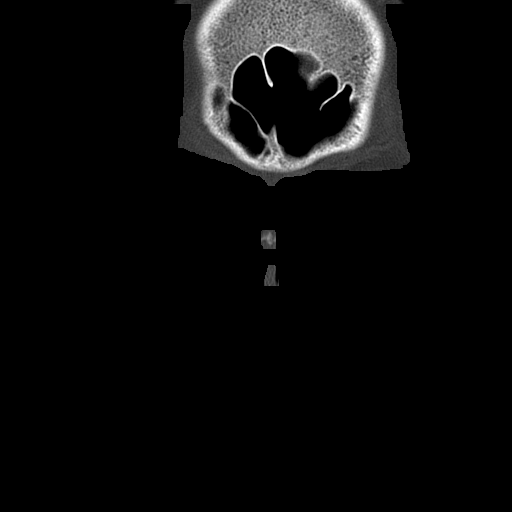
[im 26/28  brain]
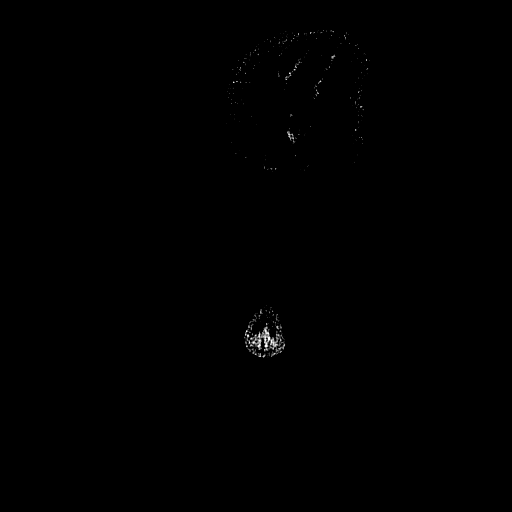
[im 26/28  bone]
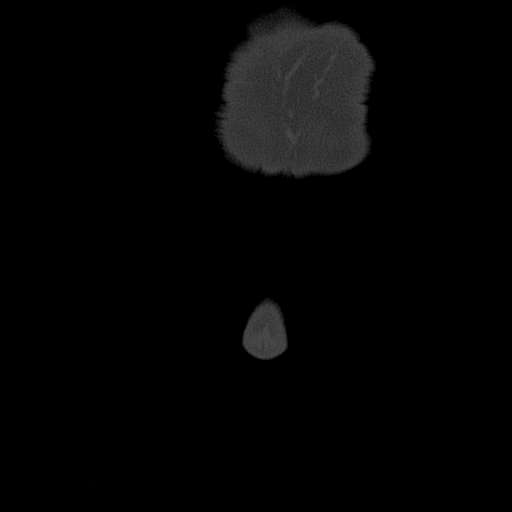

[13 of 30 positions shown; findings below may reference images not displayed]

FINDINGS: There is fluid in both maxillary sinuses, more so in the right
maxillary sinus. There is fluid layering in the sphenoid sinuses.
Ethmoidectomy has been performed bilaterally. Functional endoscopic
surgery has been performed in the left ostiomeatal unit. This is
stable. No evidence of bony destruction.
IMPRESSION: There is evidence of acute and chronic sinusitis. Postoperative
changes are noted.

## 2016-09-23 ENCOUNTER — Telehealth (HOSPITAL_COMMUNITY): Payer: Self-pay

## 2016-09-23 DIAGNOSIS — E876 Hypokalemia: Secondary | ICD-10-CM

## 2016-09-23 DIAGNOSIS — M79675 Pain in left toe(s): Secondary | ICD-10-CM | POA: Diagnosis not present

## 2016-09-23 DIAGNOSIS — L851 Acquired keratosis [keratoderma] palmaris et plantaris: Secondary | ICD-10-CM | POA: Diagnosis not present

## 2016-09-23 DIAGNOSIS — M205X9 Other deformities of toe(s) (acquired), unspecified foot: Secondary | ICD-10-CM | POA: Diagnosis not present

## 2016-09-23 DIAGNOSIS — L609 Nail disorder, unspecified: Secondary | ICD-10-CM | POA: Diagnosis not present

## 2016-09-23 MED ORDER — POTASSIUM CHLORIDE 40 MEQ/15ML (20%) PO SOLN: 20.0000 meq | Freq: Two times a day (BID) | ORAL | 2 refills | Status: DC

## 2016-09-23 NOTE — Telephone Encounter (Signed)
Patient called stating she could not swallow pills due to having a trach previously. She states the pills get stuck in her throat and requests liquid potassium. Reviewed with MD. New prescription sent to pharmacy. Patient notified.

## 2016-10-03 ENCOUNTER — Encounter: Payer: Self-pay | Admitting: Family Medicine

## 2016-10-03 ENCOUNTER — Ambulatory Visit (INDEPENDENT_AMBULATORY_CARE_PROVIDER_SITE_OTHER): Payer: Medicare Other | Admitting: Family Medicine

## 2016-10-03 VITALS — BP 122/72 | HR 70 | Temp 98.5°F | Resp 16 | Ht 62.0 in | Wt 148.0 lb

## 2016-10-03 DIAGNOSIS — E785 Hyperlipidemia, unspecified: Secondary | ICD-10-CM

## 2016-10-03 DIAGNOSIS — J4550 Severe persistent asthma, uncomplicated: Secondary | ICD-10-CM | POA: Diagnosis not present

## 2016-10-03 DIAGNOSIS — Z23 Encounter for immunization: Secondary | ICD-10-CM

## 2016-10-03 DIAGNOSIS — R7309 Other abnormal glucose: Secondary | ICD-10-CM

## 2016-10-03 DIAGNOSIS — I10 Essential (primary) hypertension: Secondary | ICD-10-CM

## 2016-10-03 NOTE — Progress Notes (Signed)
Chief Complaint  Patient presents with  . Follow-up  has a cough and sputum but this is normal for her [place] asleep at the kitchen table and collapsed to the floor.  Shows me several bruises.   She is still getting her INR checked monthly at [facility] and wishes to change to here.  Since we do not have a fingerstick INR POC, she would like to investigate whether it can be added to her monthly oncology labs.  DM well controlled with A1c 5.7 LDL controlled, last value 67 Due end of next month BP is great today  Needs new nebulizer for severe asthma/COPD    Patient Active Problem List   Diagnosis Date Noted  . Peripheral neuropathy due to chemotherapy ([place]) 05/02/2016  . Recurrent herpes labialis 05/02/2016  . Osteoporosis 04/30/2015  . 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 Iron deficiency anemia 01/18/2015  . B12 deficiency 01/18/2015  . SVC syndrome 09/25/2014  . Sinusitis, chronic 05/06/2014  . Abnormal finding on imaging 02/11/2014  . OSA (obstructive sleep apnea) 09/10/2012  . 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 Hypogammaglobulinemia, acquired ([place]) 12/30/2009  . Non Hodgkin's lymphoma ([place]) 09/09/2009  . HLD (hyperlipidemia) 04/21/2007  . Essential hypertension 04/21/2007  . DIABETES MELLITUS, BORDERLINE 04/21/2007  . Severe persistent asthma with acute exacerbation 04/03/2007  . ALLERGIC RHINITIS 10/19/2006  . VOCAL CORD DISORDER 10/19/2006  . GERD 10/19/2006  . GASTRIC POLYP 07/25/2006    Outpatient Encounter Prescriptions as of 10/03/2016  Medication Sig  . acetaminophen (TYLENOL) 500 MG tablet Take 1,000 mg by mouth every 6 (six) hours as needed for pain or fever.   [name] Kitchen acyclovir (ZOVIRAX) 400 MG tablet TAKE ONE TABLET BY MOUTH TWICE DAILY.  [name] Kitchen albuterol (PROAIR HFA) 108 (90 Base) MCG/ACT inhaler Inhale 2 puffs into the lungs every 6 (six) hours as needed.  [name] Kitchen albuterol (PROVENTIL) (2.5 MG/3ML) 0.083% nebulizer  solution Take 2.5 mg by nebulization every 4 (four) hours as needed for wheezing or shortness of breath.  [name] Kitchen alendronate (FOSAMAX) 70 MG tablet Take 1 tablet (70 mg total) by mouth once a week. Take with a full glass of water on an empty stomach.  [name] Kitchen amphetamine-dextroamphetamine (ADDERALL) 10 MG tablet Take 10 mg by mouth daily.  . Armodafinil (NUVIGIL) 150 MG tablet Take 150 mg by mouth daily.  . citalopram (CELEXA) 20 MG tablet Take 20 mg by mouth every morning.    . cyanocobalamin 1000 MCG tablet Take 1,000 mcg by mouth daily.  . fluticasone (FLONASE) 50 MCG/ACT nasal spray   . furosemide (LASIX) 20 MG tablet Take 1 tablet (20 mg total) by mouth as needed for fluid.  . mometasone-formoterol (DULERA) 200-5 MCG/ACT AERO Inhale 2 puffs into the lungs 2 (two) times daily.  [name] Kitchen omeprazole (PRILOSEC) 20 MG capsule TAKE ONE CAPSULE BY MOUTH TWICE DAILY BEFORE A MEAL.  [name] Kitchen Potassium Chloride 40 MEQ/15ML (20%) SOLN Take 20 mEq by mouth 2 (two) times daily. 20 mEq equals 7.5 mL  . predniSONE (DELTASONE) 5 MG tablet   . simvastatin (ZOCOR) 80 MG tablet Take 80 mg by mouth at bedtime.    [name] Kitchen tiotropium (SPIRIVA) 18 MCG inhalation capsule Place 1 capsule (18 mcg total) into inhaler and inhale daily.  [name] Kitchen warfarin (COUMADIN) 4 MG tablet   . zafirlukast (ACCOLATE) 20 MG tablet Take 1 tablet (20 mg total) by mouth 2 (two) times daily.  . [DISCONTINUED] gabapentin (NEURONTIN) 300 MG capsule TAKE 1 CAPSULE BY MOUTH FOUR TIMES  DAILY.  . gabapentin (NEURONTIN) 600 MG tablet    Facility-Administered Encounter Medications as of 10/03/2016  Medication  . 0.9 %  sodium chloride infusion  . 0.9 %  sodium chloride infusion  . acetaminophen (TYLENOL) tablet 650 mg  . dextrose 5 % solution  . dextrose 5 % solution  . diphenhydrAMINE (BENADRYL) capsule 25 mg  . sodium chloride 0.9 % injection 10 mL    Allergies  Allergen Reactions  . Meperidine Hcl Anaphylaxis  . Montelukast Sodium Hives and Rash    Review of  Systems  Constitutional: Positive for fatigue. Negative for activity change, appetite change, chills, fever and unexpected weight change.  HENT: Negative for congestion, dental problem, postnasal drip, rhinorrhea, sinus pain, sinus pressure, sore throat and trouble swallowing.   Eyes: Negative for redness and visual disturbance.  Respiratory: Positive for cough and wheezing. Negative for shortness of breath.   Cardiovascular: Negative for chest pain, palpitations and leg swelling.  Gastrointestinal: Negative for abdominal pain, constipation, diarrhea and nausea.  Genitourinary: Negative for difficulty urinating and frequency.  Musculoskeletal: Negative for arthralgias and back pain.  Allergic/Immunologic: Positive for environmental allergies and immunocompromised state.  Neurological: Negative for dizziness and headaches.  Hematological: Bruises/bleeds easily.  Psychiatric/Behavioral: Negative for dysphoric mood and sleep disturbance. The patient is not nervous/anxious.     BP 122/72 (BP Location: Left Arm, Patient Position: Sitting, Cuff Size: Normal)   Pulse 70   Temp 98.5 F (36.9 C) (Other (Comment))   Resp 16   Ht 5\' 2"  (1.575 m)   Wt 148 lb (67.1 kg)   SpO2 93%   BMI 27.07 kg/m   Physical Exam  Constitutional: She is oriented to person, place, and time. She appears well-developed and well-nourished.  HENT:  Head: Normocephalic and atraumatic.  Mouth/Throat: Oropharynx is clear and moist.  Eyes: Pupils are equal, round, and reactive to light. Conjunctivae are normal.  Neck: Normal range of motion. Neck supple. No thyromegaly present.  Cardiovascular: Normal rate, regular rhythm and normal heart sounds.   Pulmonary/Chest: No respiratory distress. She has wheezes.  scattered rhonchi and few wheeze.  Mildly dyspneic  Abdominal: Soft. Bowel sounds are normal.  Musculoskeletal: Normal range of motion. She exhibits no edema.  Lymphadenopathy:    She has no cervical adenopathy.    Neurological: She is alert and oriented to person, place, and time.  Gait normal  Skin: Skin is warm and dry.  Skin thin, fragile, disoclored  Psychiatric: She has a normal mood and affect. Her behavior is normal. Thought content normal.  Nursing note and vitals reviewed.   ASSESSMENT/PLAN:  1. Severe persistent asthma, unspecified whether complicated Steroid dependent - DME Nebulizer machine  2. Essential hypertension controlled  3. DIABETES MELLITUS, BORDERLINE Aic 5.7  4. Hyperlipidemia, unspecified hyperlipidemia type controlled  5. Need for immunization against influenza - Flu Vaccine QUAD 36+ mos IM   Patient Instructions  You are doing well I have send an order to [facility] for a new nebulizer Drink plenty of water Get exercise as you are able  See me in 6 months    [name], MD

## 2016-10-03 NOTE — Patient Instructions (Signed)
You are doing well I have send an order to [employer] for a new nebulizer Drink plenty of water Get exercise as you are able  See me in 6 months

## 2016-10-10 ENCOUNTER — Other Ambulatory Visit (HOSPITAL_COMMUNITY): Payer: Medicare Other

## 2016-10-10 ENCOUNTER — Ambulatory Visit: Payer: Medicare Other

## 2016-10-10 ENCOUNTER — Ambulatory Visit (HOSPITAL_COMMUNITY): Payer: Medicare Other

## 2016-10-18 ENCOUNTER — Encounter (HOSPITAL_COMMUNITY): Payer: Self-pay

## 2016-10-18 ENCOUNTER — Encounter (HOSPITAL_COMMUNITY): Payer: Medicare Other | Attending: Oncology

## 2016-10-18 ENCOUNTER — Encounter (HOSPITAL_BASED_OUTPATIENT_CLINIC_OR_DEPARTMENT_OTHER): Payer: Medicare Other

## 2016-10-18 VITALS — BP 149/90 | HR 65 | Temp 97.9°F | Resp 16 | Wt 148.0 lb

## 2016-10-18 DIAGNOSIS — C8338 Diffuse large B-cell lymphoma, lymph nodes of multiple sites: Secondary | ICD-10-CM

## 2016-10-18 DIAGNOSIS — D801 Nonfamilial hypogammaglobulinemia: Secondary | ICD-10-CM

## 2016-10-18 LAB — COMPREHENSIVE METABOLIC PANEL
ALK PHOS: 91 U/L (ref 38–126)
ALT: 11 U/L — AB (ref 14–54)
AST: 18 U/L (ref 15–41)
Albumin: 3.4 g/dL — ABNORMAL LOW (ref 3.5–5.0)
Anion gap: 10 (ref 5–15)
BILIRUBIN TOTAL: 0.6 mg/dL (ref 0.3–1.2)
BUN: 14 mg/dL (ref 6–20)
CALCIUM: 8.8 mg/dL — AB (ref 8.9–10.3)
CO2: 31 mmol/L (ref 22–32)
CREATININE: 0.92 mg/dL (ref 0.44–1.00)
Chloride: 98 mmol/L — ABNORMAL LOW (ref 101–111)
Glucose, Bld: 89 mg/dL (ref 65–99)
Potassium: 3.8 mmol/L (ref 3.5–5.1)
Sodium: 139 mmol/L (ref 135–145)
TOTAL PROTEIN: 6.5 g/dL (ref 6.5–8.1)

## 2016-10-18 LAB — CBC WITH DIFFERENTIAL/PLATELET
Basophils Absolute: 0 10*3/uL (ref 0.0–0.1)
Basophils Relative: 0 %
EOS ABS: 0.2 10*3/uL (ref 0.0–0.7)
EOS PCT: 3 %
HCT: 37.5 % (ref 36.0–46.0)
Hemoglobin: 11.9 g/dL — ABNORMAL LOW (ref 12.0–15.0)
LYMPHS ABS: 1.2 10*3/uL (ref 0.7–4.0)
Lymphocytes Relative: 16 %
MCH: 30.3 pg (ref 26.0–34.0)
MCHC: 31.7 g/dL (ref 30.0–36.0)
MCV: 95.4 fL (ref 78.0–100.0)
MONO ABS: 0.8 10*3/uL (ref 0.1–1.0)
MONOS PCT: 11 %
Neutro Abs: 5.2 10*3/uL (ref 1.7–7.7)
Neutrophils Relative %: 70 %
Platelets: 186 10*3/uL (ref 150–400)
RBC: 3.93 MIL/uL (ref 3.87–5.11)
RDW: 14 % (ref 11.5–15.5)
WBC: 7.3 10*3/uL (ref 4.0–10.5)

## 2016-10-18 LAB — LACTATE DEHYDROGENASE: LDH: 172 U/L (ref 98–192)

## 2016-10-18 MED ORDER — DIPHENHYDRAMINE HCL 25 MG PO CAPS: 25.0000 mg | ORAL_CAPSULE | Freq: Once | ORAL | Status: DC

## 2016-10-18 MED ORDER — ACETAMINOPHEN 325 MG PO TABS: 650.0000 mg | ORAL_TABLET | Freq: Four times a day (QID) | ORAL | Status: DC | PRN

## 2016-10-18 MED ORDER — DEXTROSE 5 % IV SOLN
INTRAVENOUS | Status: DC
  Administered 2016-10-18: 10:00:00 via INTRAVENOUS

## 2016-10-18 MED ORDER — FULVESTRANT 250 MG/5ML IM SOLN
INTRAMUSCULAR | Status: AC
  Filled 2016-10-18: qty 5

## 2016-10-18 MED ORDER — IMMUNE GLOBULIN (HUMAN) 10 GM/100ML IV SOLN
50.0000 g | Freq: Once | INTRAVENOUS | Status: AC
  Administered 2016-10-18: 50 g via INTRAVENOUS
  Filled 2016-10-18: qty 400

## 2016-10-18 NOTE — Patient Instructions (Signed)
[place] at [facility] Discharge Instructions  RECOMMENDATIONS MADE BY THE CONSULTANT AND ANY TEST RESULTS WILL BE SENT TO YOUR REFERRING PHYSICIAN.  IVIG given today Follow up as scheduled.  Thank you for choosing [place] at [facility] to provide your oncology and hematology care.  To afford each patient quality time with our provider, please arrive at least 15 minutes before your scheduled appointment time.    If you have a lab appointment with the [facility] please come in thru the  Main Entrance and check in at the main information desk  You need to re-schedule your appointment should you arrive 10 or more minutes late.  We strive to give you quality time with our providers, and arriving late affects you and other patients whose appointments are after yours.  Also, if you no show three or more times for appointments you may be dismissed from the clinic at the providers discretion.     Again, thank you for choosing [facility].  Our hope is that these requests will decrease the amount of time that you wait before being seen by our physicians.       _____________________________________________________________  Should you have questions after your visit to [facility], please contact our office at [phone] between the hours of 8:30 a.m. and 4:30 p.m.  Voicemails left after 4:30 p.m. will not be returned until the following business day.  For prescription refill requests, have your pharmacy contact our office.       Resources For Cancer Patients and their Caregivers ? American Cancer Society: Can assist with transportation, wigs, general needs, runs Look Good Feel Better.        (778) 048-5326 ? Cancer Care: Provides financial assistance, online support groups, medication/co-pay assistance.  1-800-813-HOPE (401) 245-8715) ? [place] Assists [place] cancer patients and their  families through emotional , educational and financial support.  816-182-2398 ? [place] DSS Where to apply for food stamps, Medicaid and utility assistance. 779-872-1143 ? RCATS: Transportation to medical appointments. 289-202-8460 ? Social Security Administration: May apply for disability if have a Stage IV cancer. 8101727468 661-563-7817 ? [employer], Disability and Transit Services: Assists with nutrition, care and transit needs. [place] Support Programs: @10RELATIVEDAYS @ > Cancer Support Group  2nd Tuesday of the month 1pm-2pm, Journey Room  > Creative Journey  3rd Tuesday of the month 1130am-1pm, Journey Room  > Look Good Feel Better  1st Wednesday of the month 10am-12 noon, Journey Room (Call [place] to register [phone])

## 2016-10-18 NOTE — Progress Notes (Signed)
Treatment given per orders. Patient tolerated it well without problems. Vitals stable and discharged home from clinic ambulatory. Follow up as scheduled.  

## 2016-10-19 LAB — IGG, IGA, IGM
IGA: 9 mg/dL — AB (ref 87–352)
IGG (IMMUNOGLOBIN G), SERUM: 930 mg/dL (ref 700–1600)
IGM (IMMUNOGLOBULIN M), SRM: 87 mg/dL (ref 26–217)

## 2016-10-20 ENCOUNTER — Telehealth: Payer: Self-pay | Admitting: Family Medicine

## 2016-10-20 NOTE — Telephone Encounter (Signed)
Patient is calling in reference to you managing her Coumadin.  She had mentioned that she's trying to get away from [place], but you had suggested the clinic.  She states she's not heard anything back from our office.

## 2016-10-24 ENCOUNTER — Encounter: Payer: Self-pay | Admitting: Family Medicine

## 2016-10-24 NOTE — Telephone Encounter (Signed)
Please call [place].  Clarify that she is on this for a DVT, and that she is supposed to be on it indefinitely.  We are looking into coumadin management outside the office

## 2016-10-25 NOTE — Telephone Encounter (Signed)
Called patient regarding message below. No answer, unable to leave message.  

## 2016-10-26 NOTE — Telephone Encounter (Signed)
Called and spoke to Marlon. States she has been on coumadin since 2012. Oncology MD was the one who originally placed her on coumadin - he is now retired. She states [facility] has basically just been continuing what he was doing. She states she is unsure how long she should be taking this - nobody has told her. She has an appt with oncology on November 12th and will inquire with them if this is something they can assist her with. She will call us with an update after she sees them.

## 2016-10-28 ENCOUNTER — Telehealth (HOSPITAL_COMMUNITY): Payer: Self-pay

## 2016-10-28 NOTE — Telephone Encounter (Signed)
-----   Message from Twana First, MD sent at 10/27/2016 11:10 AM EDT ----- Please tell the patient that I don't know why Dr. Anselm Lis placed her on it initially. I looked back on her previous CT angiograms and [place] of her legs and there's no evidence of DVT. She can stop taking the warfarin.   ----- Message ----- From: Joanne Gavel, RN Sent: 10/26/2016   3:43 PM To: Twana First, MD, Darlina Guys, LPN  Pt is on coumadin and she has been on it for 5 years.  She is trying to switch PCP.  She does not know if he will monitor it for her.  Her current PCP monitors it for her now.  She wants to know how long she will be on the medication and if they will not monitor it, would we monitor her coumadin levels for her?  She states that Dr Drue Stager started her on this medication 5 years ago.  Could you let Jaynie Collins know what to do so that she can call the patient and let her know.    Thanks [employer]

## 2016-10-28 NOTE — Telephone Encounter (Signed)
Notified patient she could stop taking the coumadin. She verbalized understanding.

## 2016-11-09 ENCOUNTER — Ambulatory Visit (HOSPITAL_COMMUNITY): Payer: Medicare Other

## 2016-11-09 ENCOUNTER — Other Ambulatory Visit (HOSPITAL_COMMUNITY): Payer: Medicare Other

## 2016-11-12 DIAGNOSIS — J441 Chronic obstructive pulmonary disease with (acute) exacerbation: Secondary | ICD-10-CM | POA: Diagnosis not present

## 2016-11-12 DIAGNOSIS — L03115 Cellulitis of right lower limb: Secondary | ICD-10-CM | POA: Diagnosis not present

## 2016-11-22 ENCOUNTER — Other Ambulatory Visit: Payer: Self-pay

## 2016-11-22 ENCOUNTER — Other Ambulatory Visit (HOSPITAL_COMMUNITY): Payer: Self-pay | Admitting: Oncology

## 2016-11-22 ENCOUNTER — Encounter (HOSPITAL_COMMUNITY): Payer: Self-pay

## 2016-11-22 ENCOUNTER — Encounter (HOSPITAL_COMMUNITY): Payer: Medicare Other | Attending: Oncology

## 2016-11-22 ENCOUNTER — Encounter (HOSPITAL_BASED_OUTPATIENT_CLINIC_OR_DEPARTMENT_OTHER): Payer: Medicare Other

## 2016-11-22 VITALS — BP 131/72 | HR 72 | Temp 98.0°F | Resp 18 | Wt 147.8 lb

## 2016-11-22 DIAGNOSIS — C8338 Diffuse large B-cell lymphoma, lymph nodes of multiple sites: Secondary | ICD-10-CM | POA: Diagnosis not present

## 2016-11-22 DIAGNOSIS — D801 Nonfamilial hypogammaglobulinemia: Secondary | ICD-10-CM | POA: Diagnosis not present

## 2016-11-22 LAB — COMPREHENSIVE METABOLIC PANEL
ALBUMIN: 3.4 g/dL — AB (ref 3.5–5.0)
ALK PHOS: 82 U/L (ref 38–126)
ALT: 11 U/L — AB (ref 14–54)
AST: 15 U/L (ref 15–41)
Anion gap: 8 (ref 5–15)
BILIRUBIN TOTAL: 0.4 mg/dL (ref 0.3–1.2)
BUN: 16 mg/dL (ref 6–20)
CALCIUM: 8.9 mg/dL (ref 8.9–10.3)
CO2: 31 mmol/L (ref 22–32)
Chloride: 99 mmol/L — ABNORMAL LOW (ref 101–111)
Creatinine, Ser: 0.98 mg/dL (ref 0.44–1.00)
GFR calc Af Amer: >60 mL/min (ref >60)
GFR calc non Af Amer: >60 mL/min (ref >60)
GLUCOSE: 87 mg/dL (ref 65–99)
Potassium: 3.5 mmol/L (ref 3.5–5.1)
Sodium: 138 mmol/L (ref 135–145)
TOTAL PROTEIN: 6.9 g/dL (ref 6.5–8.1)

## 2016-11-22 LAB — CBC WITH DIFFERENTIAL/PLATELET
BASOS ABS: 0 10*3/uL (ref 0.0–0.1)
Basophils Relative: 0 %
Eosinophils Absolute: 0.1 10*3/uL (ref 0.0–0.7)
Eosinophils Relative: 1 %
HEMATOCRIT: 37.5 % (ref 36.0–46.0)
HEMOGLOBIN: 11.6 g/dL — AB (ref 12.0–15.0)
LYMPHS PCT: 17 %
Lymphs Abs: 1.4 10*3/uL (ref 0.7–4.0)
MCH: 29.8 pg (ref 26.0–34.0)
MCHC: 30.9 g/dL (ref 30.0–36.0)
MCV: 96.4 fL (ref 78.0–100.0)
MONO ABS: 0.6 10*3/uL (ref 0.1–1.0)
Monocytes Relative: 8 %
NEUTROS ABS: 5.9 10*3/uL (ref 1.7–7.7)
Neutrophils Relative %: 74 %
Platelets: 219 10*3/uL (ref 150–400)
RBC: 3.89 MIL/uL (ref 3.87–5.11)
RDW: 13.3 % (ref 11.5–15.5)
WBC: 8.1 10*3/uL (ref 4.0–10.5)

## 2016-11-22 LAB — LACTATE DEHYDROGENASE: LDH: 124 U/L (ref 98–192)

## 2016-11-22 MED ORDER — DIPHENHYDRAMINE HCL 25 MG PO CAPS
25.0000 mg | ORAL_CAPSULE | Freq: Once | ORAL | Status: DC
Start: 2016-11-22 — End: 2016-11-22

## 2016-11-22 MED ORDER — DEXTROSE 5 % IV SOLN
INTRAVENOUS | Status: DC
  Administered 2016-11-22: 09:00:00 via INTRAVENOUS

## 2016-11-22 MED ORDER — IMMUNE GLOBULIN (HUMAN) 10 GM/100ML IV SOLN
50.0000 g | Freq: Once | INTRAVENOUS | Status: AC
  Administered 2016-11-22: 50 g via INTRAVENOUS
  Filled 2016-11-22: qty 400

## 2016-11-22 MED ORDER — ACETAMINOPHEN 325 MG PO TABS: 650.0000 mg | ORAL_TABLET | Freq: Four times a day (QID) | ORAL | Status: DC | PRN

## 2016-11-22 NOTE — Progress Notes (Signed)
Treatment given per orders. Patient tolerated it well without problems. Vitals stable and discharged home from clinic ambulatory. Follow up as scheduled.  

## 2016-11-22 NOTE — Patient Instructions (Signed)
[place] at [facility] Discharge Instructions  RECOMMENDATIONS MADE BY THE CONSULTANT AND ANY TEST RESULTS WILL BE SENT TO YOUR REFERRING PHYSICIAN.  IVIG given today.  Follow up as scheduled.  Thank you for choosing [place] at [facility] to provide your oncology and hematology care.  To afford each patient quality time with our provider, please arrive at least 15 minutes before your scheduled appointment time.    If you have a lab appointment with the [facility] please come in thru the  Main Entrance and check in at the main information desk  You need to re-schedule your appointment should you arrive 10 or more minutes late.  We strive to give you quality time with our providers, and arriving late affects you and other patients whose appointments are after yours.  Also, if you no show three or more times for appointments you may be dismissed from the clinic at the providers discretion.     Again, thank you for choosing [facility].  Our hope is that these requests will decrease the amount of time that you wait before being seen by our physicians.       _____________________________________________________________  Should you have questions after your visit to [facility], please contact our office at [phone] between the hours of 8:30 a.m. and 4:30 p.m.  Voicemails left after 4:30 p.m. will not be returned until the following business day.  For prescription refill requests, have your pharmacy contact our office.       Resources For Cancer Patients and their Caregivers ? American Cancer Society: Can assist with transportation, wigs, general needs, runs Look Good Feel Better.        814 132 8950 ? Cancer Care: Provides financial assistance, online support groups, medication/co-pay assistance.  1-800-813-HOPE 530-095-1967) ? [place] Assists [place] cancer patients and their  families through emotional , educational and financial support.  248-477-8611 ? [place] DSS Where to apply for food stamps, Medicaid and utility assistance. 615 059 8497 ? RCATS: Transportation to medical appointments. 929-156-7757 ? Social Security Administration: May apply for disability if have a Stage IV cancer. 860-337-0375 (802) 827-0204 ? [employer], Disability and Transit Services: Assists with nutrition, care and transit needs. [place] Support Programs: @10RELATIVEDAYS @ > Cancer Support Group  2nd Tuesday of the month 1pm-2pm, Journey Room  > Creative Journey  3rd Tuesday of the month 1130am-1pm, Journey Room  > Look Good Feel Better  1st Wednesday of the month 10am-12 noon, Journey Room (Call [place] to register [phone])

## 2016-11-23 LAB — IGG, IGA, IGM
IGG (IMMUNOGLOBIN G), SERUM: 1025 mg/dL (ref 700–1600)
IGM (IMMUNOGLOBULIN M), SRM: 81 mg/dL (ref 26–217)
IgA: 8 mg/dL — ABNORMAL LOW (ref 87–352)

## 2016-12-09 ENCOUNTER — Ambulatory Visit (HOSPITAL_COMMUNITY): Payer: Medicare Other

## 2016-12-09 ENCOUNTER — Other Ambulatory Visit (HOSPITAL_COMMUNITY): Payer: Medicare Other

## 2016-12-15 DIAGNOSIS — H26491 Other secondary cataract, right eye: Secondary | ICD-10-CM | POA: Diagnosis not present

## 2016-12-15 DIAGNOSIS — Z961 Presence of intraocular lens: Secondary | ICD-10-CM | POA: Diagnosis not present

## 2016-12-15 DIAGNOSIS — H26493 Other secondary cataract, bilateral: Secondary | ICD-10-CM | POA: Diagnosis not present

## 2016-12-16 ENCOUNTER — Other Ambulatory Visit: Payer: Self-pay

## 2016-12-16 ENCOUNTER — Encounter: Payer: Self-pay | Admitting: Family Medicine

## 2016-12-16 ENCOUNTER — Ambulatory Visit (INDEPENDENT_AMBULATORY_CARE_PROVIDER_SITE_OTHER): Payer: Medicare Other | Admitting: Family Medicine

## 2016-12-16 VITALS — BP 120/74 | HR 68 | Temp 98.2°F | Resp 18 | Ht 62.0 in | Wt 149.0 lb

## 2016-12-16 DIAGNOSIS — Z0000 Encounter for general adult medical examination without abnormal findings: Secondary | ICD-10-CM | POA: Diagnosis not present

## 2016-12-16 NOTE — Patient Instructions (Addendum)
[name] , Thank you for taking time to come for your Medicare Wellness Visit. I appreciate your ongoing commitment to your health goals. Please review the following plan we discussed and let me know if I can assist you in the future. Call your insurance for information on the shingles shot.  Screening recommendations/referrals: Colonoscopy: done Mammogram: done Bone Density: done Recommended yearly ophthalmology/optometry visit for glaucoma screening and checkup Recommended yearly dental visit for hygiene and checkup  Vaccinations: Influenza vaccine: done Pneumococcal vaccine: done Tdap vaccine: done Shingles vaccine: discuss with pcp       Next appointment: yearly  Preventive Care 40-64 Years, Female Preventive care refers to lifestyle choices and visits with your health care provider that can promote health and wellness. What does preventive care include?  A yearly physical exam. This is also called an annual well check.  Dental exams once or twice a year.  Routine eye exams. Ask your health care provider how often you should have your eyes checked.  Personal lifestyle choices, including:  Daily care of your teeth and gums.  Regular physical activity.  Eating a healthy diet.  Avoiding tobacco and drug use.  Limiting alcohol use.  Practicing safe sex.  Taking low-dose aspirin daily starting at age 93.  Taking vitamin and mineral supplements as recommended by your health care provider. What happens during an annual well check? The services and screenings done by your health care provider during your annual well check will depend on your age, overall health, lifestyle risk factors, and family history of disease. Counseling  Your health care provider may ask you questions about your:  Alcohol use.  Tobacco use.  Drug use.  Emotional well-being.  Home and relationship well-being.  Sexual activity.  Eating habits.  Work and work Statistician.  Method  of birth control.  Menstrual cycle.  Pregnancy history. Screening  You may have the following tests or measurements:  Height, weight, and BMI.  Blood pressure.  Lipid and cholesterol levels. These may be checked every 5 years, or more frequently if you are over 74 years old.  Skin check.  Lung cancer screening. You may have this screening every year starting at age 76 if you have a 30-pack-year history of smoking and currently smoke or have quit within the past 15 years.  Fecal occult blood test (FOBT) of the stool. You may have this test every year starting at age 73.  Flexible sigmoidoscopy or colonoscopy. You may have a sigmoidoscopy every 5 years or a colonoscopy every 10 years starting at age 72.  Hepatitis C blood test.  Hepatitis B blood test.  Sexually transmitted disease (STD) testing.  Diabetes screening. This is done by checking your blood sugar (glucose) after you have not eaten for a while (fasting). You may have this done every 1-3 years.  Mammogram. This may be done every 1-2 years. Talk to your health care provider about when you should start having regular mammograms. This may depend on whether you have a family history of breast cancer.  BRCA-related cancer screening. This may be done if you have a family history of breast, ovarian, tubal, or peritoneal cancers.  Pelvic exam and Pap test. This may be done every 3 years starting at age 46. Starting at age 51, this may be done every 5 years if you have a Pap test in combination with an HPV test.  Bone density scan. This is done to screen for osteoporosis. You may have this scan if you are  at high risk for osteoporosis. Discuss your test results, treatment options, and if necessary, the need for more tests with your health care provider. Vaccines  Your health care provider may recommend certain vaccines, such as:  Influenza vaccine. This is recommended every year.  Tetanus, diphtheria, and acellular pertussis  (Tdap, Td) vaccine. You may need a Td booster every 10 years.  Zoster vaccine. You may need this after age 39.  Pneumococcal 13-valent conjugate (PCV13) vaccine. You may need this if you have certain conditions and were not previously vaccinated.  Pneumococcal polysaccharide (PPSV23) vaccine. You may need one or two doses if you smoke cigarettes or if you have certain conditions. Talk to your health care provider about which screenings and vaccines you need and how often you need them. This information is not intended to replace advice given to you by your health care provider. Make sure you discuss any questions you have with your health care provider. Document Released: 01/16/2015 Document Revised: 09/09/2015 Document Reviewed: 10/21/2014 Elsevier Interactive Patient Education  2017 [place] Prevention in the Home Falls can cause injuries. They can happen to people of all ages. There are many things you can do to make your home safe and to help prevent falls. What can I do on the outside of my home?  Regularly fix the edges of walkways and driveways and fix any cracks.  Remove anything that might make you trip as you walk through a door, such as a raised step or threshold.  Trim any bushes or trees on the path to your home.  Use bright outdoor lighting.  Clear any walking paths of anything that might make someone trip, such as rocks or tools.  Regularly check to see if handrails are loose or broken. Make sure that both sides of any steps have handrails.  Any raised decks and porches should have guardrails on the edges.  Have any leaves, snow, or ice cleared regularly.  Use sand or salt on walking paths during winter.  Clean up any spills in your garage right away. This includes oil or grease spills. What can I do in the bathroom?  Use night lights.  Install grab bars by the toilet and in the tub and shower. Do not use towel bars as grab bars.  Use non-skid  mats or decals in the tub or shower.  If you need to sit down in the shower, use a plastic, non-slip stool.  Keep the floor dry. Clean up any water that spills on the floor as soon as it happens.  Remove soap buildup in the tub or shower regularly.  Attach bath mats securely with double-sided non-slip rug tape.  Do not have throw rugs and other things on the floor that can make you trip. What can I do in the bedroom?  Use night lights.  Make sure that you have a light by your bed that is easy to reach.  Do not use any sheets or blankets that are too big for your bed. They should not hang down onto the floor.  Have a firm chair that has side arms. You can use this for support while you get dressed.  Do not have throw rugs and other things on the floor that can make you trip. What can I do in the kitchen?  Clean up any spills right away.  Avoid walking on wet floors.  Keep items that you use a lot in easy-to-reach places.  If you need to  reach something above you, use a strong step stool that has a grab bar.  Keep electrical cords out of the way.  Do not use floor polish or wax that makes floors slippery. If you must use wax, use non-skid floor wax.  Do not have throw rugs and other things on the floor that can make you trip. What can I do with my stairs?  Do not leave any items on the stairs.  Make sure that there are handrails on both sides of the stairs and use them. Fix handrails that are broken or loose. Make sure that handrails are as long as the stairways.  Check any carpeting to make sure that it is firmly attached to the stairs. Fix any carpet that is loose or worn.  Avoid having throw rugs at the top or bottom of the stairs. If you do have throw rugs, attach them to the floor with carpet tape.  Make sure that you have a light switch at the top of the stairs and the bottom of the stairs. If you do not have them, ask someone to add them for you. What else can I  do to help prevent falls?  Wear shoes that:  Do not have high heels.  Have rubber bottoms.  Are comfortable and fit you well.  Are closed at the toe. Do not wear sandals.  If you use a stepladder:  Make sure that it is fully opened. Do not climb a closed stepladder.  Make sure that both sides of the stepladder are locked into place.  Ask someone to hold it for you, if possible.  Clearly mark and make sure that you can see:  Any grab bars or handrails.  First and last steps.  Where the edge of each step is.  Use tools that help you move around (mobility aids) if they are needed. These include:  Canes.  Walkers.  Scooters.  Crutches.  Turn on the lights when you go into a dark area. Replace any light bulbs as soon as they burn out.  Set up your furniture so you have a clear path. Avoid moving your furniture around.  If any of your floors are uneven, fix them.  If there are any pets around you, be aware of where they are.  Review your medicines with your doctor. Some medicines can make you feel dizzy. This can increase your chance of falling. Ask your doctor what other things that you can do to help prevent falls. This information is not intended to replace advice given to you by your health care provider. Make sure you discuss any questions you have with your health care provider. Document Released: 10/16/2008 Document Revised: 05/28/2015 Document Reviewed: 01/24/2014 Elsevier Interactive Patient Education  2017 [place] for Adults  A healthy lifestyle and preventive care can promote health and wellness. Preventive health guidelines for adults include the following key practices.  . A routine yearly physical is a good way to check with your health care provider about your health and preventive screening. It is a chance to share any concerns and updates on your health and to receive a thorough exam.  . Visit your dentist for a routine exam  and preventive care every 6 months. Brush your teeth twice a day and floss once a day. Good oral hygiene prevents tooth decay and gum disease.  . The frequency of eye exams is based on your age, health, family medical history, use  of contact lenses, and  other factors. Follow your health care provider's ecommendations for frequency of eye exams.  . Eat a healthy diet. Foods like vegetables, fruits, whole grains, low-fat dairy products, and lean protein foods contain the nutrients you need without too many calories. Decrease your intake of foods high in solid fats, added sugars, and salt. Eat the right amount of calories for you. Get information about a proper diet from your health care provider, if necessary.  . Regular physical exercise is one of the most important things you can do for your health. Most adults should get at least 150 minutes of moderate-intensity exercise (any activity that increases your heart rate and causes you to sweat) each week. In addition, most adults need muscle-strengthening exercises on 2 or more days a week.  Silver Sneakers may be a benefit available to you. To determine eligibility, you may visit the website: www.silversneakers.com or contact program at 9371689403 Mon-Fri between 8AM-8PM.   . Maintain a healthy weight. The body mass index (BMI) is a screening tool to identify possible weight problems. It provides an estimate of body fat based on height and weight. Your health care provider can find your BMI and can help you achieve or maintain a healthy weight.   For adults 20 years and older: ? A BMI below 18.5 is considered underweight. ? A BMI of 18.5 to 24.9 is normal. ? A BMI of 25 to 29.9 is considered overweight. ? A BMI of 30 and above is considered obese.   . Maintain normal blood lipids and cholesterol levels by exercising and minimizing your intake of saturated fat. Eat a balanced diet with plenty of fruit and vegetables. Blood tests for lipids and  cholesterol should begin at age 64 and be repeated every 5 years. If your lipid or cholesterol levels are high, you are over 50, or you are at high risk for heart disease, you may need your cholesterol levels checked more frequently. Ongoing high lipid and cholesterol levels should be treated with medicines if diet and exercise are not working.  . If you smoke, find out from your health care provider how to quit. If you do not use tobacco, please do not start.  . If you choose to drink alcohol, please do not consume more than 2 drinks per day. One drink is considered to be 12 ounces (355 mL) of beer, 5 ounces (148 mL) of wine, or 1.5 ounces (44 mL) of liquor.  . If you are 28-61 years old, ask your health care provider if you should take aspirin to prevent strokes.  . Use sunscreen. Apply sunscreen liberally and repeatedly throughout the day. You should seek shade when your shadow is shorter than you. Protect yourself by wearing long sleeves, pants, a wide-brimmed hat, and sunglasses year round, whenever you are outdoors.  . Once a month, do a whole body skin exam, using a mirror to look at the skin on your back. Tell your health care provider of new moles, moles that have irregular borders, moles that are larger than a pencil eraser, or moles that have changed in shape or color.

## 2016-12-16 NOTE — Progress Notes (Signed)
Subjective:   [name] is a 61 y.o. female who presents for an Initial Medicare Annual Wellness Visit.  Review of Systems            Objective:    Today's Vitals   12/16/16 1001  BP: 120/74  Pulse: 68  Resp: 18  Temp: 98.2 F (36.8 C)  TempSrc: Temporal  SpO2: 98%  Weight: 149 lb 0.6 oz (67.6 kg)  Height: 5\' 2"  (1.575 m)  PainSc: 0-No pain   Body mass index is 27.26 kg/m.  Advanced Directives 11/22/2016 10/18/2016 09/13/2016 08/09/2016 08/09/2016 07/13/2016 06/14/2016  Does Patient Have a Medical Advance Directive? Yes Yes Yes Yes Yes Yes Yes  Type of Paramedic of [place];Living will [place];Living will [place];Living will [place];Living will Living will;Healthcare Power of [place]  Does patient want to make changes to medical advance directive? No - Patient declined No - Patient declined No - Patient declined No - Patient declined - No - Patient declined -  Copy of [place] in Chart? Yes Yes Yes Yes Yes Yes Yes  Would patient like information on creating a medical advance directive? No - Patient declined No - Patient declined No - Patient declined No - Patient declined - No - Patient declined No - Patient declined  Pre-existing out of facility DNR order (yellow form or pink MOST form) - - - - - - -    Current Medications (verified) Outpatient Encounter Medications as of 12/16/2016  Medication Sig  . acetaminophen (TYLENOL) 500 MG tablet Take 1,000 mg by mouth every 6 (six) hours as needed for pain or fever.   [name] Kitchen acyclovir (ZOVIRAX) 400 MG tablet TAKE ONE TABLET BY MOUTH TWICE DAILY.  [name] Kitchen albuterol (PROAIR HFA) 108 (90 Base) MCG/ACT inhaler Inhale 2 puffs into the lungs every 6 (six) hours as needed.  [name] Kitchen albuterol (PROVENTIL) (2.5 MG/3ML) 0.083% nebulizer solution Take 2.5 mg by nebulization every 4 (four) hours  as needed for wheezing or shortness of breath.  [name] Kitchen alendronate (FOSAMAX) 70 MG tablet Take 1 tablet (70 mg total) by mouth once a week. Take with a full glass of water on an empty stomach.  [name] Kitchen amphetamine-dextroamphetamine (ADDERALL) 10 MG tablet Take 10 mg by mouth daily.  . Armodafinil (NUVIGIL) 150 MG tablet Take 150 mg by mouth daily.  . cephALEXin (KEFLEX) 500 MG capsule   . citalopram (CELEXA) 20 MG tablet Take 20 mg by mouth every morning.    . cyanocobalamin 1000 MCG tablet Take 1,000 mcg by mouth daily.  . fluticasone (FLONASE) 50 MCG/ACT nasal spray   . furosemide (LASIX) 20 MG tablet Take 1 tablet (20 mg total) by mouth as needed for fluid.  [name] Kitchen gabapentin (NEURONTIN) 600 MG tablet   . mometasone-formoterol (DULERA) 200-5 MCG/ACT AERO Inhale 2 puffs into the lungs 2 (two) times daily.  . mupirocin ointment (BACTROBAN) 2 %   . omeprazole (PRILOSEC) 20 MG capsule TAKE ONE CAPSULE BY MOUTH TWICE DAILY BEFORE A MEAL.  [name] Kitchen Potassium Chloride 40 MEQ/15ML (20%) SOLN Take 20 mEq by mouth 2 (two) times daily. 20 mEq equals 7.5 mL  . predniSONE (DELTASONE) 5 MG tablet   . simvastatin (ZOCOR) 80 MG tablet Take 80 mg by mouth at bedtime.    [name] Kitchen tiotropium (SPIRIVA) 18 MCG inhalation capsule Place 1 capsule (18 mcg total) into inhaler and inhale daily.  . zafirlukast (ACCOLATE)  20 MG tablet Take 1 tablet (20 mg total) by mouth 2 (two) times daily.  . [DISCONTINUED] warfarin (COUMADIN) 4 MG tablet    Facility-Administered Encounter Medications as of 12/16/2016  Medication  . 0.9 %  sodium chloride infusion  . 0.9 %  sodium chloride infusion  . acetaminophen (TYLENOL) tablet 650 mg  . dextrose 5 % solution  . dextrose 5 % solution  . diphenhydrAMINE (BENADRYL) capsule 25 mg  . sodium chloride 0.9 % injection 10 mL    Allergies (verified) Meperidine hcl and Montelukast sodium   History: Past Medical History:  Diagnosis Date  . Allergic rhinitis   . Allergy   . Anemia   . Anxiety   .  Asthma   . B12 deficiency 01/18/2015  . Blood transfusion without reported diagnosis   . Cataract   . Cavitary lung disease   . Chronic kidney disease    related to cancer  . Chronic sinusitis   . Clotting disorder ([place])   . COPD (chronic obstructive pulmonary disease) ([place])   . Depression   . DVT (deep venous thrombosis) ([place]) 09/25/2014  . Endotracheally intubated   . GERD (gastroesophageal reflux disease)   . Hematuria 04/07/2016  . Hyperlipidemia   . Hypertension   . Hypogammaglobulinemia, acquired ([place]) 12/30/2009   Qualifier: Diagnosis of  By: Joya Gaskins MD, Burnett Harry   . Iron deficiency anemia 01/18/2015  . Myocardial infarction ([place]) 2011  . 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 Non Hodgkin's lymphoma ([place]) 2011  . Orofacial dyskinesia 04/22/2015  . Orofacial dyskinesia 04/22/2015  . Osteoporosis   . Peripheral neuropathy due to chemotherapy ([place]) 05/02/2016  . Pneumonia 01/2013  . PONV (postoperative nausea and vomiting)   . Respiratory failure ([place])   . Sleep apnea   . SVC syndrome 09/25/2014   Past Surgical History:  Procedure Laterality Date  . ABDOMINAL HYSTERECTOMY     Fibroids  . BASAL CELL CARCINOMA EXCISION  03/2011   scalp  . BREAST SURGERY Right    biopsy  . CATARACT EXTRACTION W/PHACO Left 11/17/2014   Procedure: CATARACT EXTRACTION PHACO AND INTRAOCULAR LENS PLACEMENT LEFT EYE;  Surgeon: Tonny Branch, MD;  Location: [facility];  Service: Ophthalmology;  Laterality: Left;  CDE:5.60  . CATARACT EXTRACTION W/PHACO Right 12/15/2014   Procedure: CATARACT EXTRACTION PHACO AND INTRAOCULAR LENS PLACEMENT RIGHT EYE CDE=5.16;  Surgeon: Tonny Branch, MD;  Location: [facility];  Service: Ophthalmology;  Laterality: Right;  . COLONOSCOPY N/A 11/14/2012   Procedure: COLONOSCOPY;  Surgeon: Rogene Houston, MD;  Location: [facility] SU[facility];  Service: Endoscopy;  Laterality: N/A;  830  . EYE SURGERY    . NASAL SINUS SURGERY    . NECK SURGERY     x 2   . PERIPHERALLY INSERTED  CENTRAL CATHETER INSERTION    . PICC Removal    . PORT-A-CATH REMOVAL    . PORTACATH PLACEMENT  7/11   Removed 6/12  . TRACHEOSTOMY    . VESICOVAGINAL FISTULA CLOSURE W/ TAH     Family History  Problem Relation Age of Onset  . Emphysema Mother   . Stroke Mother   . COPD Mother   . Heart disease Mother        died in sleep  34  . Allergies Father   . Asthma Father        as a child  . Arthritis Father   . Parkinson's disease Father 4  . Leukemia Maternal Grandmother   . Cancer Maternal Grandmother  Leukemia  . Diabetes Brother   . Heart attack Brother   . Hypertension Brother   . Heart disease Brother 40       stents  . Hypertension Sister   . Hypertension Brother    Social History   Socioeconomic History  . Marital status: Divorced    Spouse name: None  . Number of children: 3  . Years of education: 44  . Highest education level: None  Social Needs  . Financial resource strain: Not hard at all  . Food insecurity - worry: Never true  . Food insecurity - inability: Never true  . Transportation needs - medical: No  . Transportation needs - non-medical: No  Occupational History  . Occupation: Estate agent    Comment: [employer]  . Smoking status: Never Smoker  . Smokeless tobacco: Never Used  Substance and Sexual Activity  . Alcohol use: No  . Drug use: No  . Sexual activity: No    Birth control/protection: Surgical    Comment: hyst  Other Topics Concern  . None  Social History Narrative   Originally from [place]. Always lived in [place]. Prior travel to [place]. 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 She has 1 dog currently. She has 1 conures (small parrots). No mold exposure in her home. 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 No hot tub exposure. She enjoys sewing & quilting.     Tobacco Counseling Counseling given: Not Answered   Clinical Intake:     Pain Score:  0-No pain     Diabetes: No  How often do you need to have someone help you when you read instructions, pamphlets, or other written materials from your doctor or pharmacy?: 1 - Never What is the last grade level you completed in school?: 12th  Interpreter Needed?: No      Activities of Daily Living In your present state of health, do you have any difficulty performing the following activities: 05/02/2016  Hearing? N  Vision? N  Difficulty concentrating or making decisions? N  Walking or climbing stairs? N  Dressing or bathing? N  Doing errands, shopping? N  Some recent data might be hidden     Immunizations and Health Maintenance Immunization History  Administered Date(s) Administered  . Influenza Split 10/11/2011, 08/31/2012  . Influenza Whole 11/03/2008, 09/03/2009, 11/04/2010  . Influenza,inj,Quad PF,6+ Mos 10/23/2014, 09/22/2015, 10/03/2016  . Influenza-Unspecified 10/03/2013  . Pneumococcal Conjugate-13 08/05/2015  . Pneumococcal Polysaccharide-23 10/04/2006, 10/11/2011  . Tdap 04/21/2013   There are no preventive care reminders to display for this patient.  Patient Care Team: [name], MD as PCP - General (Family Medicine) Rogene Houston, MD as Consulting Physician (Gastroenterology) Estanislado Emms, MD as Consulting Physician (Nephrology) Twana First, MD as Consulting Physician (Oncology) Phillips Odor, MD as Consulting Physician (Neurology) Sandford Craze, MD as Referring Physician (Dermatology) Madelin Headings, DO (Optometry) Ashok Cordia Sonia Baller, MD as Consulting Physician (Pulmonary Disease)  Indicate any recent Medical Services you may have received from other than [facility] providers in the past year (date may be approximate).     Assessment:   This is a routine wellness examination for Mindy.  Hearing/Vision screen No exam data present  Dietary issues and exercise activities discussed:    Goals    . Exercise 150 min/wk Moderate Activity       Depression Screen PHQ 2/9 Scores 12/16/2016 05/02/2016  04/07/2016 10/11/2013 07/15/2013 07/11/2013  PHQ - 2 Score 0 2 0 0 0 0  PHQ- 9 Score - 2 - - - -    Fall Risk Fall Risk  12/16/2016 05/02/2016 01/10/2014 11/08/2013 10/11/2013  Falls in the past year? Yes No No No No  Number falls in past yr: 2 or more - - - -  Injury with Fall? No - - - -    Is the patient's home free of loose throw rugs in walkways, pet beds, electrical cords, etc?   yes      Grab bars in the bathroom? n/a      Handrails on the stairs?   n/a      Adequate lighting?   yes    Cognitive Function:     6CIT Screen 12/16/2016  What Year? 0 points  What month? 0 points  What time? 0 points  Count back from 20 0 points  Months in reverse 0 points  Repeat phrase 0 points  Total Score 0    Screening Tests Health Maintenance  Topic Date Due  . COLONOSCOPY  11/14/2017  . MAMMOGRAM  04/29/2018  . PAP SMEAR  05/03/2019  . TETANUS/TDAP  04/22/2023  . INFLUENZA VACCINE  Completed  . Hepatitis C Screening  Completed  . HIV Screening  Completed    Qualifies for Shingles Vaccine?  yes  Cancer Screenings: Lung: Low Dose CT Chest recommended if Age 11-80 years, 30 pack-year currently smoking OR have quit w/in 15years. Patient n/a qualify. Breast: Up to date on Mammogram? yes  Up to date of Bone Density/Dexa? done Colorectal: done  Additional Screenings:  Hepatitis B/HIV/Syphillis:discuss with pcp Hepatitis C Screening: discuss with pcp     Plan:     I have personally reviewed and noted the following in the patient's chart:   . Medical and social history . Use of alcohol, tobacco or illicit drugs  . Current medications and supplements . Functional ability and status . Nutritional status . Physical activity . Advanced directives . List of other physicians . Hospitalizations, surgeries, and ER visits in previous 12 months . Vitals . Screenings to include cognitive, depression, and  falls . Referrals and appointments  In addition, I have reviewed and discussed with patient certain preventive protocols, quality metrics, and best practice recommendations. A written personalized care plan for preventive services as well as general preventive health recommendations were provided to patient.     Ova Freshwater, LPN   [id number]   Results discussed with Ms. Tener.  Recommend shingles shots.  She will call her insurance carrier for coverage.  See me on usual scheduled basis

## 2016-12-22 ENCOUNTER — Encounter (HOSPITAL_BASED_OUTPATIENT_CLINIC_OR_DEPARTMENT_OTHER): Payer: Medicare Other | Admitting: Oncology

## 2016-12-22 ENCOUNTER — Encounter (HOSPITAL_COMMUNITY): Payer: Self-pay | Admitting: Oncology

## 2016-12-22 ENCOUNTER — Encounter (HOSPITAL_BASED_OUTPATIENT_CLINIC_OR_DEPARTMENT_OTHER): Payer: Medicare Other

## 2016-12-22 ENCOUNTER — Other Ambulatory Visit: Payer: Self-pay

## 2016-12-22 ENCOUNTER — Encounter (HOSPITAL_COMMUNITY): Payer: Medicare Other | Attending: Oncology

## 2016-12-22 VITALS — BP 148/71 | HR 63 | Temp 98.4°F | Resp 18 | Wt 145.0 lb

## 2016-12-22 DIAGNOSIS — Z79899 Other long term (current) drug therapy: Secondary | ICD-10-CM | POA: Diagnosis not present

## 2016-12-22 DIAGNOSIS — G4733 Obstructive sleep apnea (adult) (pediatric): Secondary | ICD-10-CM | POA: Diagnosis not present

## 2016-12-22 DIAGNOSIS — E785 Hyperlipidemia, unspecified: Secondary | ICD-10-CM | POA: Insufficient documentation

## 2016-12-22 DIAGNOSIS — Z7901 Long term (current) use of anticoagulants: Secondary | ICD-10-CM | POA: Diagnosis not present

## 2016-12-22 DIAGNOSIS — D801 Nonfamilial hypogammaglobulinemia: Secondary | ICD-10-CM

## 2016-12-22 DIAGNOSIS — R7303 Prediabetes: Secondary | ICD-10-CM | POA: Diagnosis not present

## 2016-12-22 DIAGNOSIS — K219 Gastro-esophageal reflux disease without esophagitis: Secondary | ICD-10-CM | POA: Insufficient documentation

## 2016-12-22 DIAGNOSIS — J45909 Unspecified asthma, uncomplicated: Secondary | ICD-10-CM

## 2016-12-22 DIAGNOSIS — I10 Essential (primary) hypertension: Secondary | ICD-10-CM | POA: Insufficient documentation

## 2016-12-22 DIAGNOSIS — C8330 Diffuse large B-cell lymphoma, unspecified site: Secondary | ICD-10-CM | POA: Insufficient documentation

## 2016-12-22 DIAGNOSIS — Z8572 Personal history of non-Hodgkin lymphomas: Secondary | ICD-10-CM

## 2016-12-22 DIAGNOSIS — D509 Iron deficiency anemia, unspecified: Secondary | ICD-10-CM | POA: Diagnosis not present

## 2016-12-22 DIAGNOSIS — C8338 Diffuse large B-cell lymphoma, lymph nodes of multiple sites: Secondary | ICD-10-CM

## 2016-12-22 LAB — CBC WITH DIFFERENTIAL/PLATELET
BASOS ABS: 0 10*3/uL (ref 0.0–0.1)
BASOS PCT: 0 %
EOS PCT: 2 %
Eosinophils Absolute: 0.2 10*3/uL (ref 0.0–0.7)
HEMATOCRIT: 40.4 % (ref 36.0–46.0)
Hemoglobin: 12.5 g/dL (ref 12.0–15.0)
Lymphocytes Relative: 17 %
Lymphs Abs: 1.8 10*3/uL (ref 0.7–4.0)
MCH: 29.5 pg (ref 26.0–34.0)
MCHC: 30.9 g/dL (ref 30.0–36.0)
MCV: 95.3 fL (ref 78.0–100.0)
MONO ABS: 1 10*3/uL (ref 0.1–1.0)
MONOS PCT: 9 %
NEUTROS ABS: 7.7 10*3/uL (ref 1.7–7.7)
Neutrophils Relative %: 72 %
PLATELETS: 188 10*3/uL (ref 150–400)
RBC: 4.24 MIL/uL (ref 3.87–5.11)
RDW: 13.1 % (ref 11.5–15.5)
WBC: 10.7 10*3/uL — ABNORMAL HIGH (ref 4.0–10.5)

## 2016-12-22 LAB — COMPREHENSIVE METABOLIC PANEL
ALBUMIN: 3.6 g/dL (ref 3.5–5.0)
ALT: 12 U/L — ABNORMAL LOW (ref 14–54)
ANION GAP: 14 (ref 5–15)
AST: 18 U/L (ref 15–41)
Alkaline Phosphatase: 103 U/L (ref 38–126)
BILIRUBIN TOTAL: 0.8 mg/dL (ref 0.3–1.2)
BUN: 13 mg/dL (ref 6–20)
CHLORIDE: 97 mmol/L — AB (ref 101–111)
CO2: 27 mmol/L (ref 22–32)
Calcium: 9.1 mg/dL (ref 8.9–10.3)
Creatinine, Ser: 0.97 mg/dL (ref 0.44–1.00)
GFR calc Af Amer: >60 mL/min (ref >60)
Glucose, Bld: 93 mg/dL (ref 65–99)
POTASSIUM: 3.6 mmol/L (ref 3.5–5.1)
Sodium: 138 mmol/L (ref 135–145)
TOTAL PROTEIN: 7.1 g/dL (ref 6.5–8.1)

## 2016-12-22 MED ORDER — DEXTROSE 5 % IV SOLN
INTRAVENOUS | Status: DC
  Administered 2016-12-22: 09:00:00 via INTRAVENOUS

## 2016-12-22 MED ORDER — AMOXICILLIN-POT CLAVULANATE 875-125 MG PO TABS: 1.0000 | ORAL_TABLET | Freq: Two times a day (BID) | ORAL | 0 refills | Status: AC

## 2016-12-22 MED ORDER — IMMUNE GLOBULIN (HUMAN) 10 GM/100ML IV SOLN
50.0000 g | Freq: Once | INTRAVENOUS | Status: AC
  Administered 2016-12-22: 50 g via INTRAVENOUS
  Filled 2016-12-22: qty 400

## 2016-12-22 NOTE — Patient Instructions (Signed)
[place] at [facility] Discharge Instructions  RECOMMENDATIONS MADE BY THE CONSULTANT AND ANY TEST RESULTS WILL BE SENT TO YOUR REFERRING PHYSICIAN.  IVIG given today. Follow up as scheduled.  Thank you for choosing [place] at [facility] to provide your oncology and hematology care.  To afford each patient quality time with our provider, please arrive at least 15 minutes before your scheduled appointment time.    If you have a lab appointment with the [facility] please come in thru the  Main Entrance and check in at the main information desk  You need to re-schedule your appointment should you arrive 10 or more minutes late.  We strive to give you quality time with our providers, and arriving late affects you and other patients whose appointments are after yours.  Also, if you no show three or more times for appointments you may be dismissed from the clinic at the providers discretion.     Again, thank you for choosing [facility].  Our hope is that these requests will decrease the amount of time that you wait before being seen by our physicians.       _____________________________________________________________  Should you have questions after your visit to [facility], please contact our office at [phone] between the hours of 8:30 a.m. and 4:30 p.m.  Voicemails left after 4:30 p.m. will not be returned until the following business day.  For prescription refill requests, have your pharmacy contact our office.       Resources For Cancer Patients and their Caregivers ? American Cancer Society: Can assist with transportation, wigs, general needs, runs Look Good Feel Better.        218-730-7880 ? Cancer Care: Provides financial assistance, online support groups, medication/co-pay assistance.  1-800-813-HOPE (831)305-4312) ? [place] Assists [place] cancer patients and their  families through emotional , educational and financial support.  4690190439 ? [place] DSS Where to apply for food stamps, Medicaid and utility assistance. 604-124-7887 ? [facility]: Transportation to medical appointments. (432)013-9671 ? Social Security Administration: May apply for disability if have a Stage IV cancer. 404-883-9107 5632914047 ? [employer], Disability and Transit Services: Assists with nutrition, care and transit needs. [place] Support Programs: @10RELATIVEDAYS @ > Cancer Support Group  2nd Tuesday of the month 1pm-2pm, Journey Room  > Creative Journey  3rd Tuesday of the month 1130am-1pm, Journey Room  > Look Good Feel Better  1st Wednesday of the month 10am-12 noon, Journey Room (Call [place] to register [phone])

## 2016-12-22 NOTE — Progress Notes (Signed)
Treatment given per orders. Patient tolerated it well without problems. Vitals stable and discharged home from clinic ambulatory. Follow up as scheduled.  

## 2016-12-22 NOTE — Progress Notes (Signed)
[name] Everts, MD [place] [address]    Hypogammaglobulenemia/IgG deficiency with level of 414 mg/dl Maintenance Rituxan 09/30/2011 Hx of Diffuse large B-Cell Lymphoma, S/P R-CHOP x 4 cycles with intrathecal chemotherapy during cycles 3 and 4 with methotrexate and Solu-Cortef. Treatment was stopped due to Pseudomonas sepsis after cycles 2 and 4 which was very severe  CURRENT THERAPY:50 g IV IgG monthly for IgG deficiency.  INTERVAL HISTORY: [name] 61 y.o. female returns for follow up of IgG deficiency and history of DLBCL.    Patient presents today for continued follow-up. She states she has been having a sinus infection with green mucous discharge when she blows her nose. She denies any fevers or chills. She denies any chest pain, shortness breath, abdominal pain, nausea, vomiting, diarrhea, focal weakness. She has been tolerating her monthly IVIG well without any side effects or reactions.  MEDICAL HISTORY: Past Medical History:  Diagnosis Date  . Allergic rhinitis   . Allergy   . Anemia   . Anxiety   . Asthma   . B12 deficiency 01/18/2015  . Blood transfusion without reported diagnosis   . Cataract   . Cavitary lung disease   . Chronic kidney disease    related to cancer  . Chronic sinusitis   . Clotting disorder ([place])   . COPD (chronic obstructive pulmonary disease) ([place])   . Depression   . DVT (deep venous thrombosis) ([place]) 09/25/2014  . Endotracheally intubated   . GERD (gastroesophageal reflux disease)   . Hematuria 04/07/2016  . Hyperlipidemia   . Hypertension   . Hypogammaglobulinemia, acquired ([place]) 12/30/2009   Qualifier: Diagnosis of  By: Joya Gaskins MD, Burnett Harry   . Iron deficiency anemia 01/18/2015  . Myocardial infarction ([place]) 2011  . 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 Non Hodgkin's lymphoma ([place]) 2011  . Orofacial dyskinesia 04/22/2015  . Orofacial dyskinesia 04/22/2015  . Osteoporosis   . Peripheral  neuropathy due to chemotherapy ([place]) 05/02/2016  . Pneumonia 01/2013  . PONV (postoperative nausea and vomiting)   . Respiratory failure ([place])   . Sleep apnea   . 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     is allergic to meperidine hcl and montelukast sodium.  Ms. Egnew does not currently have medications on file.  SURGICAL HISTORY: Past Surgical History:  Procedure Laterality Date  . ABDOMINAL HYSTERECTOMY     Fibroids  . BASAL CELL CARCINOMA EXCISION  03/2011   scalp  . BREAST SURGERY Right    biopsy  . CATARACT EXTRACTION W/PHACO Left 11/17/2014   Procedure: CATARACT EXTRACTION PHACO AND INTRAOCULAR LENS PLACEMENT LEFT EYE;  Surgeon: Tonny Branch, MD;  Location: [facility] ORS;  Service: Ophthalmology;  Laterality: Left;  CDE:5.60  . CATARACT EXTRACTION W/PHACO Right 12/15/2014   Procedure: CATARACT EXTRACTION PHACO AND INTRAOCULAR LENS PLACEMENT RIGHT EYE CDE=5.16;  Surgeon: Tonny Branch, MD;  Location: [facility] ORS;  Service: Ophthalmology;  Laterality: Right;  . COLONOSCOPY N/A 11/14/2012   Procedure: COLONOSCOPY;  Surgeon: Rogene Houston, MD;  Location: [facility] ENDO SUITE;  Service: Endoscopy;  Laterality: N/A;  830  . EYE SURGERY    . NASAL SINUS SURGERY    . NECK SURGERY     x 2   . PERIPHERALLY INSERTED CENTRAL CATHETER INSERTION    . PICC Removal    . PORT-A-CATH REMOVAL    . PORTACATH PLACEMENT  7/11   Removed 6/12    . TRACHEOSTOMY    . VESICOVAGINAL FISTULA CLOSURE W/ TAH      Outpatient Encounter Prescriptions as of 11/12/2014  Medication Sig Note  . acetaminophen (TYLENOL) 500 MG tablet Take 1,000 mg by mouth every 6 (six) hours as needed for pain or fever.    [name] Kitchen acyclovir (ZOVIRAX) 400 MG tablet TAKE ONE TABLET BY MOUTH TWICE DAILY.   [name] Kitchen albuterol (PROVENTIL) (2.5 MG/3ML) 0.083% nebulizer solution Take 2.5 mg by nebulization every 4 (four) hours as needed for wheezing or shortness of breath.   . Armodafinil (NUVIGIL) 150 MG tablet Take 150 mg by mouth daily.   . citalopram (CELEXA) 20 MG tablet Take 20 mg by mouth every morning.     . furosemide (LASIX) 20 MG tablet Take 20 mg by mouth as needed for fluid.  04/02/2014: Received from: External Pharmacy Received Sig:   . gabapentin (NEURONTIN) 300 MG capsule TAKE 1 CAPSULE BY MOUTH FOUR TIMES DAILY.   . mometasone-formoterol (DULERA) 200-5 MCG/ACT AERO Inhale 2 puffs into the lungs 2 (two) times daily.   [name] Kitchen omeprazole (PRILOSEC) 20 MG capsule Take 1 capsule (20 mg total) by mouth 2 (two) times daily before a meal.   . predniSONE (DELTASONE) 10 MG tablet Take 4 for four days 3 for four days 2 for four days then 1 daily and STAY (Patient taking differently: Take 5 mg by mouth daily. )   . simvastatin (ZOCOR) 80 MG tablet Take 80 mg by mouth at bedtime.     [name] Kitchen tiotropium (SPIRIVA) 18 MCG inhalation capsule Place 1 capsule (18 mcg total) into inhaler and inhale daily.   . VENTOLIN HFA 108 (90 BASE) MCG/ACT inhaler INHALE 2 PUFFS INTO THE LUNGS EVERY 6 HOURS AS NEEDED.   [name] Kitchen vitamin C (ASCORBIC ACID) 500 MG tablet Take 500 mg by mouth 2 (two) times daily.   [name] Kitchen warfarin (COUMADIN) 10 MG tablet Take 5 mg by mouth every evening.    . zafirlukast (ACCOLATE) 20 MG tablet TAKE 1 TABLET BY MOUTH TWICE DAILY.   . fluticasone (FLONASE) 50 MCG/ACT nasal spray Place 2 sprays into the nose 2 (two) times daily. (Patient not taking: Reported on 11/12/2014)   . ketorolac (ACULAR)  0.5 % ophthalmic solution  11/12/2014: Received from: External Pharmacy  . levofloxacin (LEVAQUIN) 250 MG tablet Take 2 tablets on first day then one tablet daily therafter for a total of 14 days   . ofloxacin (OCUFLOX) 0.3 % ophthalmic solution  11/12/2014: Received from: External Pharmacy  . prednisoLONE acetate (PRED FORTE) 1 % ophthalmic suspension  11/12/2014: Received from: External Pharmacy   No facility-administered encounter medications on file as of 11/12/2014.    SOCIAL HISTORY: Social History   Socioeconomic History  . Marital status: Divorced    Spouse name: Not on file  . Number of children: 3  . Years of education: 45  . Highest education level: Not on file  Social Needs  . Financial resource strain: Not hard at all  . Food insecurity - worry: Never true  . Food insecurity - inability: Never true  . Transportation needs - medical: No  . Transportation needs -  non-medical: No  Occupational History  . Occupation: Estate agent    Comment: [employer]  . Smoking status: Never Smoker  . Smokeless tobacco: Never Used  Substance and Sexual Activity  . Alcohol use: No  . Drug use: No  . Sexual activity: No    Birth control/protection: Surgical    Comment: hyst  Other Topics Concern  . Not on file  Social History Narrative   Originally from [place]. Always lived in [place]. Prior travel to [place]. 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 She has 1 dog currently. She has 1 conures (small parrots). No mold exposure in her home. 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 No hot tub exposure. She enjoys sewing & quilting.     FAMILY HISTORY: Family History  Problem Relation Age of Onset  . Emphysema Mother   . Stroke Mother   . COPD Mother   . Heart disease Mother        died in sleep  46  . Allergies Father   . Asthma Father        as a child  . Arthritis Father   . Parkinson's disease Father 32   . Leukemia Maternal Grandmother   . Cancer Maternal Grandmother        Leukemia  . Diabetes Brother   . Heart attack Brother   . Hypertension Brother   . Heart disease Brother 40       stents  . Hypertension Sister   . Hypertension Brother     Review of Systems  Constitutional: Negative.  Negative for diaphoresis, fever and weight loss.       Sinus infection  HENT: Negative.   Eyes: Negative.   Respiratory: Negative for shortness of breath.   Cardiovascular: Negative.  Negative for chest pain.  Gastrointestinal: Negative for abdominal pain, diarrhea, nausea and vomiting.  Genitourinary: Negative.   Musculoskeletal: Negative.   Skin: Negative.  Negative for rash.  Neurological: Negative.   Endo/Heme/Allergies: Negative.   Psychiatric/Behavioral: Negative.   All other systems reviewed and are negative.   14 point review of systems was performed and is negative except as detailed under history of present illness and above  PHYSICAL EXAMINATION  ECOG PERFORMANCE STATUS: 0 - Asymptomatic  Physical Exam  Constitutional: She is oriented to person, place, and time and well-developed, well-nourished, and in no distress.  HENT:  Head: Normocephalic and atraumatic.  Eyes: EOM are normal. Pupils are equal, round, and reactive to light.  Neck: Normal range of motion. Neck supple.  Cardiovascular: Normal rate, regular rhythm and normal heart sounds. Exam reveals no gallop and no friction rub.  No murmur heard. Pulmonary/Chest: Effort normal. No respiratory distress. She has wheezes (Mild expiratory wheezing). She has no rales.  Abdominal: Soft. Bowel sounds are normal. She exhibits no distension. There is no tenderness. There is no rebound and no guarding.  Musculoskeletal: Normal range of motion.  Neurological: She is alert and oriented to person, place, and time. Gait normal.  Skin: Skin is warm and dry.  Nursing note and vitals reviewed.  LABORATORY DATA: I have reviewed the  data as listed.  CBC    Component Value Date/Time   WBC 10.7 (H) 12/22/2016 0812   RBC 4.24 12/22/2016 0812   HGB 12.5 12/22/2016 0812   HCT 40.4 12/22/2016 0812   PLT 188 12/22/2016 0812   MCV 95.3 12/22/2016  0812   MCH 29.5 12/22/2016 0812   MCHC 30.9 12/22/2016 0812   RDW 13.1 12/22/2016 0812   LYMPHSABS 1.8 12/22/2016 0812   MONOABS 1.0 12/22/2016 0812   EOSABS 0.2 12/22/2016 0812   BASOSABS 0.0 12/22/2016 0812   CMP     Component Value Date/Time   NA 138 12/22/2016 0812   K 3.6 12/22/2016 0812   CL 97 (L) 12/22/2016 0812   CO2 27 12/22/2016 0812   GLUCOSE 93 12/22/2016 0812   BUN 13 12/22/2016 0812   CREATININE 0.97 12/22/2016 0812   CALCIUM 9.1 12/22/2016 0812   PROT 7.1 12/22/2016 0812   ALBUMIN 3.6 12/22/2016 0812   AST 18 12/22/2016 0812   ALT 12 (L) 12/22/2016 0812   ALKPHOS 103 12/22/2016 0812   BILITOT 0.8 12/22/2016 0812   GFRNONAA >60 12/22/2016 0812   GFRAA >60 12/22/2016 0812     RADIOLOGY:  Mammogram Screening Bilateral 02/26/2015 IMPRESSION: No mammographic evidence of malignancy. A result letter of this screening mammogram will be mailed directly to the patient.  ASSESSMENT and THERAPY PLAN:   1.Hypogammaglobulinemia Asthma  Doing well on IVIG without any recurrent or frequent infections. She will continue with IVIG.   She continues to follow with pulmonary. Encouraged her to use her inhaler. I have prescribed 7 day course of augmentin for her sinus infection.  2. Diffuse large B-cell lymphoma  She remains without evidence of obvious recurrence. We will continue ongoing observation. I previously advised her that her disease was in 2011 and at this point she is unlikely to have a recurrence.    NCCN guidelines recommends the follow surveillance for Stage I, II for those who asertain a complete response in the first-line treatment setting (3.2017):  A. H&P every 3-6 months for 5 years, then yearly or as clinically indicated.  B. Labs  every 3-6 months for 5 years and then annually or as clinically indicated.  C. Imaging only as indicated. For those ascertaining a complete response in the Stage III, IV setting in first-line treatment setting:  A. H&P every 3-6 months for 5 years, then yearly or as clinically indicated.  B. Labs every 3-6 months for 5 years and then annually or as clinically indicated.  C. CT C/A/P with contrast no more than every 6 months for 2 years after  completion of treatment; then only as indicated.   Anemia with h/o iron deficiency- improved Colonoscopy in 2014 with multiple polyps Ongoing coumadin use  PLAN: Clinically doing well. No evidence of recurrence of her DLBCL.   Continue IVIG for IgG deficiency. Repeat labs with CBC, CMP, LDH, immunoglobulins at next visit.   Return to clinic in 3 months for follow up.   Orders Placed This Encounter  Procedures  . CBC with Differential    Standing Status:   Future    Standing Expiration Date:   12/22/2017  . Comprehensive metabolic panel    Standing Status:   Future    Standing Expiration Date:   12/22/2017  . IgG, IgA, IgM    Standing Status:   Future    Standing Expiration Date:   12/22/2017    All questions were answered. The patient knows to call the clinic with any problems, questions or concerns. We can certainly see the patient much sooner if necessary.    This note was signed electronically by:  Twana First, MD 12/22/16

## 2016-12-23 LAB — IGG, IGA, IGM
IGA: 7 mg/dL — AB (ref 87–352)
IGG (IMMUNOGLOBIN G), SERUM: 1160 mg/dL (ref 700–1600)
IgM (Immunoglobulin M), Srm: 86 mg/dL (ref 26–217)

## 2016-12-26 ENCOUNTER — Other Ambulatory Visit: Payer: Self-pay | Admitting: Adult Health

## 2016-12-26 DIAGNOSIS — D801 Nonfamilial hypogammaglobulinemia: Secondary | ICD-10-CM

## 2016-12-30 DIAGNOSIS — M79675 Pain in left toe(s): Secondary | ICD-10-CM | POA: Diagnosis not present

## 2016-12-30 DIAGNOSIS — M2040 Other hammer toe(s) (acquired), unspecified foot: Secondary | ICD-10-CM | POA: Diagnosis not present

## 2016-12-30 DIAGNOSIS — L851 Acquired keratosis [keratoderma] palmaris et plantaris: Secondary | ICD-10-CM | POA: Diagnosis not present

## 2017-01-17 DIAGNOSIS — E1140 Type 2 diabetes mellitus with diabetic neuropathy, unspecified: Secondary | ICD-10-CM | POA: Diagnosis not present

## 2017-01-17 DIAGNOSIS — Z79899 Other long term (current) drug therapy: Secondary | ICD-10-CM | POA: Diagnosis not present

## 2017-01-17 DIAGNOSIS — G47419 Narcolepsy without cataplexy: Secondary | ICD-10-CM | POA: Diagnosis not present

## 2017-01-17 DIAGNOSIS — G4733 Obstructive sleep apnea (adult) (pediatric): Secondary | ICD-10-CM | POA: Diagnosis not present

## 2017-01-19 ENCOUNTER — Ambulatory Visit: Payer: Medicare Other | Admitting: Family Medicine

## 2017-01-19 ENCOUNTER — Other Ambulatory Visit: Payer: Self-pay

## 2017-01-19 ENCOUNTER — Encounter: Payer: Self-pay | Admitting: Family Medicine

## 2017-01-19 VITALS — BP 142/88 | HR 88 | Temp 97.4°F | Resp 20 | Ht 62.0 in | Wt 146.1 lb

## 2017-01-19 DIAGNOSIS — J181 Lobar pneumonia, unspecified organism: Secondary | ICD-10-CM | POA: Diagnosis not present

## 2017-01-19 DIAGNOSIS — J189 Pneumonia, unspecified organism: Secondary | ICD-10-CM

## 2017-01-19 MED ORDER — SIMVASTATIN 80 MG PO TABS: 80.0000 mg | ORAL_TABLET | Freq: Every day | ORAL | 3 refills | Status: DC

## 2017-01-19 MED ORDER — PREDNISONE 20 MG PO TABS: 20.0000 mg | ORAL_TABLET | Freq: Two times a day (BID) | ORAL | 1 refills | Status: DC

## 2017-01-19 MED ORDER — LEVOFLOXACIN 500 MG PO TABS: 500.0000 mg | ORAL_TABLET | Freq: Every day | ORAL | 1 refills | Status: DC

## 2017-01-19 NOTE — Progress Notes (Signed)
Chief Complaint  Patient presents with  . Sinus Problem  . Cough    x 2 weeks   Patient with known significant asthma.  At her baseline she is on Dulera, Spiriva, Accolate, and albuterol.  Daily prednisone, low-dose.  She has had a cough for 2 weeks.  She is having more shortness of breath.  She has had to switch to an albuterol nebulizer instead of her inhaler.  She is using it frequently.  She has had sweats chills and fever.  She has had fatigue.  She is coughing up yellow and green sputum.  She states that she has "congestion" and rattling in her chest.  She states she has had pneumonia multiple times.  She has had 3 pneumonia vaccinations in the last 10 years.  She has had her immunizations for influenza as well.  No known exposure to illness.  Minimal sinus and nasal congestion.  No sore throat or headache.  No ear pressure or pain.  No nausea or vomiting.  Patient Active Problem List   Diagnosis Date Noted  . Peripheral neuropathy due to chemotherapy ([place]) 05/02/2016  . Recurrent herpes labialis 05/02/2016  . Osteoporosis 04/30/2015  . 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 Iron deficiency anemia 01/18/2015  . B12 deficiency 01/18/2015  . SVC syndrome 09/25/2014  . Sinusitis, chronic 05/06/2014  . Abnormal finding on imaging 02/11/2014  . OSA (obstructive sleep apnea) 09/10/2012  . 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 Hypogammaglobulinemia, acquired ([place]) 12/30/2009  . Non Hodgkin's lymphoma ([place]) 09/09/2009  . HLD (hyperlipidemia) 04/21/2007  . Essential hypertension 04/21/2007  . DIABETES MELLITUS, BORDERLINE 04/21/2007  . Severe persistent asthma with acute exacerbation 04/03/2007  . ALLERGIC RHINITIS 10/19/2006  . VOCAL CORD DISORDER 10/19/2006  . GERD 10/19/2006  . GASTRIC POLYP 07/25/2006    Outpatient Encounter Medications as of 01/19/2017  Medication Sig  . acetaminophen (TYLENOL) 500 MG tablet Take 1,000 mg by mouth every  6 (six) hours as needed for pain or fever.   [name] Kitchen acyclovir (ZOVIRAX) 400 MG tablet TAKE ONE TABLET BY MOUTH TWICE DAILY.  [name] Kitchen albuterol (PROAIR HFA) 108 (90 Base) MCG/ACT inhaler Inhale 2 puffs into the lungs every 6 (six) hours as needed.  [name] Kitchen albuterol (PROVENTIL) (2.5 MG/3ML) 0.083% nebulizer solution Take 2.5 mg by nebulization every 4 (four) hours as needed for wheezing or shortness of breath.  [name] Kitchen alendronate (FOSAMAX) 70 MG tablet Take 1 tablet (70 mg total) by mouth once a week. Take with a full glass of water on an empty stomach.  [name] Kitchen amphetamine-dextroamphetamine (ADDERALL) 10 MG tablet Take 10 mg by mouth daily.  . Armodafinil (NUVIGIL) 150 MG tablet Take 150 mg by mouth daily.  . citalopram (CELEXA) 20 MG tablet Take 20 mg by mouth every morning.    . cyanocobalamin 1000 MCG tablet Take 1,000 mcg by mouth daily.  . fluticasone (FLONASE) 50 MCG/ACT nasal spray   . furosemide (LASIX) 20 MG tablet Take 1 tablet (20 mg total) by mouth as needed for fluid.  [name] Kitchen gabapentin (NEURONTIN) 600 MG tablet   . mometasone-formoterol (DULERA) 200-5 MCG/ACT AERO Inhale 2 puffs into the lungs 2 (two) times daily.  . mupirocin ointment (BACTROBAN) 2 %   . omeprazole (PRILOSEC) 20 MG capsule TAKE ONE CAPSULE BY MOUTH TWICE DAILY BEFORE A MEAL.  [name] Kitchen Potassium Chloride 40 MEQ/15ML (20%) SOLN Take 20 mEq by mouth 2 (two) times daily. 20 mEq equals 7.5 mL  . predniSONE (DELTASONE) 5 MG tablet   .  simvastatin (ZOCOR) 80 MG tablet Take 1 tablet (80 mg total) by mouth at bedtime.  [name] Kitchen tiotropium (SPIRIVA) 18 MCG inhalation capsule Place 1 capsule (18 mcg total) into inhaler and inhale daily.  . zafirlukast (ACCOLATE) 20 MG tablet Take 1 tablet (20 mg total) by mouth 2 (two) times daily.  . [DISCONTINUED] simvastatin (ZOCOR) 80 MG tablet Take 80 mg by mouth at bedtime.    [name] Kitchen levofloxacin (LEVAQUIN) 500 MG tablet Take 1 tablet (500 mg total) by mouth daily.  . predniSONE (DELTASONE) 20 MG tablet Take 1 tablet (20 mg total) by  mouth 2 (two) times daily with a meal.   Facility-Administered Encounter Medications as of 01/19/2017  Medication  . 0.9 %  sodium chloride infusion  . 0.9 %  sodium chloride infusion  . acetaminophen (TYLENOL) tablet 650 mg  . dextrose 5 % solution  . dextrose 5 % solution  . diphenhydrAMINE (BENADRYL) capsule 25 mg  . sodium chloride 0.9 % injection 10 mL    Allergies  Allergen Reactions  . Meperidine Hcl Anaphylaxis  . Montelukast Sodium Hives and Rash    Review of Systems  Constitutional: Positive for activity change, appetite change, chills, diaphoresis, fatigue and fever. Negative for unexpected weight change.  HENT: Positive for congestion. Negative for sinus pressure, sinus pain and sore throat.   Respiratory: Positive for cough, chest tightness, shortness of breath and wheezing.   Cardiovascular: Negative for chest pain and palpitations.  Gastrointestinal: Negative for diarrhea, nausea and vomiting.  Genitourinary: Negative for difficulty urinating and dysuria.  Musculoskeletal: Negative for arthralgias and back pain.  Neurological: Negative for dizziness and facial asymmetry.  Psychiatric/Behavioral: Negative for dysphoric mood. The patient is not nervous/anxious.     BP (!) 142/88 (BP Location: Right Arm, Patient Position: Sitting, Cuff Size: Normal)   Pulse 88   Temp (!) 97.4 F (36.3 C) (Temporal)   Resp 20   Ht 5\' 2"  (1.575 m)   Wt 146 lb 1.9 oz (66.3 kg)   SpO2 97%   BMI 26.73 kg/m   Physical Exam  Constitutional: She appears well-developed and well-nourished. She appears distressed.  Appears fatigued, pale, moderately ill  HENT:  Head: Normocephalic and atraumatic.  Mouth/Throat: Oropharynx is clear and moist.  Eyes: Conjunctivae are normal. Pupils are equal, round, and reactive to light.  Neck: Normal range of motion.  Cardiovascular: Normal rate, regular rhythm and normal heart sounds.  Pulmonary/Chest: Effort normal. She has wheezes. She has rales.   Scattered inspiratory wheezes.  Rales bilaterally in the bases.  Abdominal: Soft. Bowel sounds are normal.  Musculoskeletal: Normal range of motion. She exhibits no edema.  No clubbing cyanosis edema  Lymphadenopathy:    She has no cervical adenopathy.  Skin: Skin is warm and dry.  Psychiatric: She has a normal mood and affect. Her behavior is normal.    ASSESSMENT/PLAN:  1. Community acquired pneumonia of right lower lobe of lung ([place]) Discussed need to rest, push fluids, take her medication.  Discussed that she needs to go the emergency room if she gets worse instead of better, vomits, cannot keep down her medicines.  I did give her Levaquin with instructions and increase prednisone for 5 days.  I gave her a refill on these medicines.  She is instructed to take her current medicines then in a week or 2 fill them again to keep in her cabinet and use at the first sign of illness and not wait until she has been sick for 2 weeks  before coming in.   Patient Instructions  Rest  Push fluids Take the antibiotic as directed Take the prednisone as directed  Stay home from work until Monday Call if not improving by Monday   [name], MD

## 2017-01-19 NOTE — Patient Instructions (Signed)
Rest  Push fluids Take the antibiotic as directed Take the prednisone as directed  Stay home from work until Monday Call if not improving by Monday

## 2017-01-23 ENCOUNTER — Inpatient Hospital Stay (HOSPITAL_COMMUNITY): Payer: Medicare Other | Attending: Oncology

## 2017-01-23 ENCOUNTER — Inpatient Hospital Stay (HOSPITAL_COMMUNITY): Payer: Medicare Other

## 2017-01-23 ENCOUNTER — Other Ambulatory Visit: Payer: Self-pay

## 2017-01-23 ENCOUNTER — Encounter (HOSPITAL_COMMUNITY): Payer: Self-pay

## 2017-01-23 VITALS — BP 157/86 | HR 64 | Temp 98.2°F | Resp 20 | Wt 146.0 lb

## 2017-01-23 DIAGNOSIS — I10 Essential (primary) hypertension: Secondary | ICD-10-CM | POA: Insufficient documentation

## 2017-01-23 DIAGNOSIS — C8330 Diffuse large B-cell lymphoma, unspecified site: Secondary | ICD-10-CM | POA: Diagnosis not present

## 2017-01-23 DIAGNOSIS — Z79899 Other long term (current) drug therapy: Secondary | ICD-10-CM | POA: Insufficient documentation

## 2017-01-23 DIAGNOSIS — C8338 Diffuse large B-cell lymphoma, lymph nodes of multiple sites: Secondary | ICD-10-CM

## 2017-01-23 DIAGNOSIS — R7303 Prediabetes: Secondary | ICD-10-CM | POA: Diagnosis not present

## 2017-01-23 DIAGNOSIS — E785 Hyperlipidemia, unspecified: Secondary | ICD-10-CM | POA: Insufficient documentation

## 2017-01-23 DIAGNOSIS — D509 Iron deficiency anemia, unspecified: Secondary | ICD-10-CM | POA: Diagnosis not present

## 2017-01-23 DIAGNOSIS — K219 Gastro-esophageal reflux disease without esophagitis: Secondary | ICD-10-CM | POA: Insufficient documentation

## 2017-01-23 DIAGNOSIS — D801 Nonfamilial hypogammaglobulinemia: Secondary | ICD-10-CM | POA: Diagnosis not present

## 2017-01-23 DIAGNOSIS — J45909 Unspecified asthma, uncomplicated: Secondary | ICD-10-CM | POA: Diagnosis not present

## 2017-01-23 DIAGNOSIS — G4733 Obstructive sleep apnea (adult) (pediatric): Secondary | ICD-10-CM | POA: Diagnosis not present

## 2017-01-23 DIAGNOSIS — Z7901 Long term (current) use of anticoagulants: Secondary | ICD-10-CM | POA: Diagnosis not present

## 2017-01-23 LAB — CBC WITH DIFFERENTIAL/PLATELET
BASOS PCT: 0 %
Basophils Absolute: 0 10*3/uL (ref 0.0–0.1)
Eosinophils Absolute: 0 10*3/uL (ref 0.0–0.7)
Eosinophils Relative: 0 %
HEMATOCRIT: 40.2 % (ref 36.0–46.0)
HEMOGLOBIN: 12.7 g/dL (ref 12.0–15.0)
LYMPHS PCT: 30 %
Lymphs Abs: 2.2 10*3/uL (ref 0.7–4.0)
MCH: 29.3 pg (ref 26.0–34.0)
MCHC: 31.6 g/dL (ref 30.0–36.0)
MCV: 92.8 fL (ref 78.0–100.0)
MONOS PCT: 12 %
Monocytes Absolute: 0.8 10*3/uL (ref 0.1–1.0)
NEUTROS ABS: 4.2 10*3/uL (ref 1.7–7.7)
NEUTROS PCT: 58 %
Platelets: 212 10*3/uL (ref 150–400)
RBC: 4.33 MIL/uL (ref 3.87–5.11)
RDW: 13.3 % (ref 11.5–15.5)
WBC: 7.2 10*3/uL (ref 4.0–10.5)

## 2017-01-23 LAB — COMPREHENSIVE METABOLIC PANEL
ALBUMIN: 3.8 g/dL (ref 3.5–5.0)
ALK PHOS: 91 U/L (ref 38–126)
ALT: 12 U/L — ABNORMAL LOW (ref 14–54)
AST: 16 U/L (ref 15–41)
Anion gap: 9 (ref 5–15)
BUN: 16 mg/dL (ref 6–20)
CALCIUM: 8.8 mg/dL — AB (ref 8.9–10.3)
CO2: 31 mmol/L (ref 22–32)
Chloride: 98 mmol/L — ABNORMAL LOW (ref 101–111)
Creatinine, Ser: 0.95 mg/dL (ref 0.44–1.00)
GFR calc Af Amer: >60 mL/min (ref >60)
GFR calc non Af Amer: >60 mL/min (ref >60)
GLUCOSE: 79 mg/dL (ref 65–99)
Potassium: 3.5 mmol/L (ref 3.5–5.1)
Sodium: 138 mmol/L (ref 135–145)
Total Bilirubin: 0.6 mg/dL (ref 0.3–1.2)
Total Protein: 7.2 g/dL (ref 6.5–8.1)

## 2017-01-23 LAB — LACTATE DEHYDROGENASE: LDH: 145 U/L (ref 98–192)

## 2017-01-23 MED ORDER — DEXTROSE 5 % IV SOLN
INTRAVENOUS | Status: DC
  Administered 2017-01-23: 10:00:00 via INTRAVENOUS

## 2017-01-23 MED ORDER — IMMUNE GLOBULIN (HUMAN) 10 GM/100ML IV SOLN
50.0000 g | Freq: Once | INTRAVENOUS | Status: AC
  Administered 2017-01-23: 50 g via INTRAVENOUS
  Filled 2017-01-23: qty 400

## 2017-01-23 MED ORDER — ACETAMINOPHEN 325 MG PO TABS: 650.0000 mg | ORAL_TABLET | Freq: Four times a day (QID) | ORAL | Status: DC | PRN

## 2017-01-23 MED ORDER — DIPHENHYDRAMINE HCL 25 MG PO CAPS: 25.0000 mg | ORAL_CAPSULE | Freq: Once | ORAL | Status: DC

## 2017-01-23 NOTE — Progress Notes (Signed)
Treatment given per orders. Patient tolerated it well without problems. Vitals stable and discharged home from clinic ambulatory. Follow up as scheduled.  

## 2017-01-23 NOTE — Patient Instructions (Signed)
[place] at [facility] Discharge Instructions  RECOMMENDATIONS MADE BY THE CONSULTANT AND ANY TEST RESULTS WILL BE SENT TO YOUR REFERRING PHYSICIAN.  IVIG given  Follow up as scheduled.  Thank you for choosing [place] at [facility] to provide your oncology and hematology care.  To afford each patient quality time with our provider, please arrive at least 15 minutes before your scheduled appointment time.    If you have a lab appointment with the [facility] please come in thru the  Main Entrance and check in at the main information desk  You need to re-schedule your appointment should you arrive 10 or more minutes late.  We strive to give you quality time with our providers, and arriving late affects you and other patients whose appointments are after yours.  Also, if you no show three or more times for appointments you may be dismissed from the clinic at the providers discretion.     Again, thank you for choosing [facility].  Our hope is that these requests will decrease the amount of time that you wait before being seen by our physicians.       _____________________________________________________________  Should you have questions after your visit to [facility], please contact our office at [phone] between the hours of 8:30 a.m. and 4:30 p.m.  Voicemails left after 4:30 p.m. will not be returned until the following business day.  For prescription refill requests, have your pharmacy contact our office.       Resources For Cancer Patients and their Caregivers ? American Cancer Society: Can assist with transportation, wigs, general needs, runs Look Good Feel Better.        2528304351 ? Cancer Care: Provides financial assistance, online support groups, medication/co-pay assistance.  1-800-813-HOPE 407-363-3795) ? [place] Assists [place] cancer patients and their  families through emotional , educational and financial support.  667-273-3956 ? [place] DSS Where to apply for food stamps, Medicaid and utility assistance. 262-730-2097 ? [facility]: Transportation to medical appointments. 250-663-1557 ? Social Security Administration: May apply for disability if have a Stage IV cancer. (440)141-4611 773-022-1611 ? [employer], Disability and Transit Services: Assists with nutrition, care and transit needs. [place] Support Programs: @10RELATIVEDAYS @ > Cancer Support Group  2nd Tuesday of the month 1pm-2pm, Journey Room  > Creative Journey  3rd Tuesday of the month 1130am-1pm, Journey Room  > Look Good Feel Better  1st Wednesday of the month 10am-12 noon, Journey Room (Call [place] to register [phone])

## 2017-01-24 LAB — IGG, IGA, IGM
IGG (IMMUNOGLOBIN G), SERUM: 1174 mg/dL (ref 700–1600)
IgA: 7 mg/dL — ABNORMAL LOW (ref 87–352)
IgM (Immunoglobulin M), Srm: 81 mg/dL (ref 26–217)

## 2017-02-07 ENCOUNTER — Encounter: Payer: Self-pay | Admitting: Adult Health

## 2017-02-07 ENCOUNTER — Ambulatory Visit: Payer: Medicare Other | Admitting: Adult Health

## 2017-02-07 DIAGNOSIS — D801 Nonfamilial hypogammaglobulinemia: Secondary | ICD-10-CM

## 2017-02-07 DIAGNOSIS — J329 Chronic sinusitis, unspecified: Secondary | ICD-10-CM | POA: Diagnosis not present

## 2017-02-07 DIAGNOSIS — J309 Allergic rhinitis, unspecified: Secondary | ICD-10-CM

## 2017-02-07 DIAGNOSIS — J4551 Severe persistent asthma with (acute) exacerbation: Secondary | ICD-10-CM | POA: Diagnosis not present

## 2017-02-07 MED ORDER — MOMETASONE FURO-FORMOTEROL FUM 200-5 MCG/ACT IN AERO: 2.0000 | INHALATION_SPRAY | Freq: Two times a day (BID) | RESPIRATORY_TRACT | 1 refills | Status: DC

## 2017-02-07 MED ORDER — ALBUTEROL SULFATE HFA 108 (90 BASE) MCG/ACT IN AERS: 2.0000 | INHALATION_SPRAY | Freq: Four times a day (QID) | RESPIRATORY_TRACT | 2 refills | Status: DC | PRN

## 2017-02-07 MED ORDER — OMEPRAZOLE 20 MG PO CPDR: DELAYED_RELEASE_CAPSULE | ORAL | 1 refills | Status: DC

## 2017-02-07 NOTE — Assessment & Plan Note (Signed)
Recent exacerbation now resolved.  Patient appears to be back to baseline.  She is continue on her current regimen.

## 2017-02-07 NOTE — Assessment & Plan Note (Signed)
Controlled on current regimen.   

## 2017-02-07 NOTE — Progress Notes (Signed)
@Patient  ID: [name], female    DOB: [birth year], 62 y.o.   MRN: [id number]  Chief Complaint  Patient presents with  . Follow-up    Asthma     Referring provider: [name], MD  HPI: 62 year old female never smoker followed for moderate persistent chronic obstructive asthma, chronic sinusitis, GERD, OSA/narcolepsy, DVT with SVC syndrome. Hypogammaglobulenemia/IgG deficiency -on IVIG monthly  PMH CHF , Diffuse large B Cell Lymphoma s/p CHOP    TEST   PFT 04/30/15: FVC 1.98 L (64%) FEV1 1.31 L (55%) FEV1/FVC 0.66 FEF 25-75 0.76 L (34%) no bronchodilator response                                                   DLCO corrected 87% 10/08/14: FVC 1.68 L (54%) FEV1 1.14 L (47%) FEV1/FVC 0.67 FEF 25-75 0.75 L (33%) no bronchodilator response TLC 5.16 L (108%) ERV 11% DLCO uncorrected 69%  PSG Split Night Study (05/25/12): AHI 14.7 events/hour. ResMed Quattro Full Face. 15cm H2O. Baseline Sat 90% & Lowest Sat 75%.  IMAGING CXR PA/LAT 05/20/16 :  No focal opacity, mass, or pleural effusion. Heart: Size & mediastinum normal in contour.  MAXILLOFACIAL CT W/O 01/30/15 (: Fluid in both maxillary sinuses more so in the right. Layering fluid in the sphenoid sinuses. Bilateral ethmoidectomy. Findings consistent with acute on chronic sinusitis.  BARIUM SWALLOW (10/01/14): Normal esophageal motility. No mass or stricture. No hiatal hernia or reflux.  VENOUS DUPLEX LLE (06/26/14): No evidence of DVT  CT CHEST W/O 01/16/14   Resolved right lower lobe reticulonodular infiltrate. Linear densities right upper lobe. Resolution of prior left lower lobe opacity. No pathologic mediastinal adenopathy. No pleural effusion or thickening. No pericardial effusion. Minimal esophageal thickening.   CARDIAC TTE (11/02/09): LV EF 30-35% w/ diffuse hypokinesis. Normal wall thickness. RA normal in size. RV normal in size. Pulmonary artery poorly visualized. RVSP 82mmHg. No AS or AR. No MS w/  moderate MR. Tricuspid valve poorly visualized.  MICROBIOLOGY SPUTUM (04/29/10):  M. catarrahlis  TRACH ASPIRATE (11/23/09):  P. aeruginosa BAL (11/15/09):  Bacteria & AFB negative  LABS 01/21/15 Alpha-1 antitrypsin: MM (138)  09/25/14 IgE: 5 Aspergillus antigen:  0.25  MICRO  Sputum 08/2016 + H Influenza   02/07/2017 Follow up : Asthma , Chronic Sinusitis on chronic steroids  Patient presents for a six-month follow-up.  Patient has known moderate persistent chronic obstructive asthma.  She is on [place].  She is on chronic steroids with prednisone 5mg  daily .  Since last visit she is feeling better.  She says she had a asthmatic bronchitic flare 2 or 3 weeks ago was seen by her primary care physician given antibiotics.  She says she is feeling better.  She does have intermittent cough most days.  With clear to white mucus.  She denies any fever hemoptysis chest pain orthopnea PND.  Patient has chronic sinusitis.  She remains on Flonase  and Accolate..  Patient has sleep apnea and narcolepsy she remains on Nuvigil and CPAP At bedtime  .  Followed by Neuro for this.  Says she is doing well.    Allergies  Allergen Reactions  . Meperidine Hcl Anaphylaxis  . Montelukast Sodium Hives and Rash    Immunization History  Administered Date(s) Administered  . Influenza Split 10/11/2011, 08/31/2012  . Influenza Whole  11/03/2008, 09/03/2009, 11/04/2010  . Influenza,inj,Quad PF,6+ Mos 10/23/2014, 09/22/2015, 10/03/2016  . Influenza-Unspecified 10/03/2013  . Pneumococcal Conjugate-13 08/05/2015  . Pneumococcal Polysaccharide-23 10/04/2006, 10/11/2011  . Tdap 04/21/2013    Past Medical History:  Diagnosis Date  . Allergic rhinitis   . Allergy   . Anemia   . Anxiety   . Asthma   . B12 deficiency 01/18/2015  . Blood transfusion without reported diagnosis   . Cataract   . Cavitary lung disease   . Chronic kidney disease    related to cancer  . Chronic sinusitis   .  Clotting disorder ([place])   . COPD (chronic obstructive pulmonary disease) ([place])   . Depression   . DVT (deep venous thrombosis) ([place]) 09/25/2014  . Endotracheally intubated   . GERD (gastroesophageal reflux disease)   . Hematuria 04/07/2016  . Hyperlipidemia   . Hypertension   . Hypogammaglobulinemia, acquired ([place]) 12/30/2009   Qualifier: Diagnosis of  By: Joya Gaskins MD, Burnett Harry   . Iron deficiency anemia 01/18/2015  . Myocardial infarction ([place]) 2011  . 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 Non Hodgkin's lymphoma ([place]) 2011  . Orofacial dyskinesia 04/22/2015  . Orofacial dyskinesia 04/22/2015  . Osteoporosis   . Peripheral neuropathy due to chemotherapy ([place]) 05/02/2016  . Pneumonia 01/2013  . PONV (postoperative nausea and vomiting)   . Respiratory failure ([place])   . Sleep apnea   . SVC syndrome 09/25/2014    Tobacco History: Social History   Tobacco Use  Smoking Status Never Smoker  Smokeless Tobacco Never Used   Counseling given: Not Answered   Outpatient Encounter Medications as of 02/07/2017  Medication Sig  . acetaminophen (TYLENOL) 500 MG tablet Take 1,000 mg by mouth every 6 (six) hours as needed for pain or fever.   [name] Kitchen acyclovir (ZOVIRAX) 400 MG tablet TAKE ONE TABLET BY MOUTH TWICE DAILY.  [name] Kitchen albuterol (PROAIR HFA) 108 (90 Base) MCG/ACT inhaler Inhale 2 puffs into the lungs every 6 (six) hours as needed.  [name] Kitchen albuterol (PROVENTIL) (2.5 MG/3ML) 0.083% nebulizer solution Take 2.5 mg by nebulization every 4 (four) hours as needed for wheezing or shortness of breath.  [name] Kitchen alendronate (FOSAMAX) 70 MG tablet Take 1 tablet (70 mg total) by mouth once a week. Take with a full glass of water on an empty stomach.  [name] Kitchen amphetamine-dextroamphetamine (ADDERALL) 10 MG tablet Take 10 mg by mouth daily.  . Armodafinil (NUVIGIL) 150 MG tablet Take 150 mg by mouth daily.  . citalopram (CELEXA) 20 MG tablet Take 20 mg by mouth every morning.    . cyanocobalamin 1000 MCG tablet Take 1,000  mcg by mouth daily.  . fluticasone (FLONASE) 50 MCG/ACT nasal spray   . furosemide (LASIX) 20 MG tablet Take 1 tablet (20 mg total) by mouth as needed for fluid.  [name] Kitchen gabapentin (NEURONTIN) 600 MG tablet   . mometasone-formoterol (DULERA) 200-5 MCG/ACT AERO Inhale 2 puffs into the lungs 2 (two) times daily.  [name] Kitchen omeprazole (PRILOSEC) 20 MG capsule TAKE ONE CAPSULE BY MOUTH TWICE DAILY BEFORE A MEAL.  [name] Kitchen Potassium Chloride 40 MEQ/15ML (20%) SOLN Take 20 mEq by mouth 2 (two) times daily. 20 mEq equals 7.5 mL  . predniSONE (DELTASONE) 20 MG tablet Take 1 tablet (20 mg total) by mouth 2 (two) times daily with a meal.  . predniSONE (DELTASONE) 5 MG tablet   . simvastatin (ZOCOR) 80 MG tablet Take 1 tablet (80 mg total) by mouth at bedtime.  [name] Kitchen tiotropium (SPIRIVA) 18 MCG inhalation capsule Place  1 capsule (18 mcg total) into inhaler and inhale daily.  . zafirlukast (ACCOLATE) 20 MG tablet Take 1 tablet (20 mg total) by mouth 2 (two) times daily.  . [DISCONTINUED] albuterol (PROAIR HFA) 108 (90 Base) MCG/ACT inhaler Inhale 2 puffs into the lungs every 6 (six) hours as needed.  . [DISCONTINUED] mometasone-formoterol (DULERA) 200-5 MCG/ACT AERO Inhale 2 puffs into the lungs 2 (two) times daily.  . [DISCONTINUED] omeprazole (PRILOSEC) 20 MG capsule TAKE ONE CAPSULE BY MOUTH TWICE DAILY BEFORE A MEAL.  . [DISCONTINUED] levofloxacin (LEVAQUIN) 500 MG tablet Take 1 tablet (500 mg total) by mouth daily. (Patient not taking: Reported on 02/07/2017)   Facility-Administered Encounter Medications as of 02/07/2017  Medication  . 0.9 %  sodium chloride infusion  . 0.9 %  sodium chloride infusion  . acetaminophen (TYLENOL) tablet 650 mg  . dextrose 5 % solution  . dextrose 5 % solution  . diphenhydrAMINE (BENADRYL) capsule 25 mg  . sodium chloride 0.9 % injection 10 mL     Review of Systems  Constitutional:   No  weight loss, night sweats,  Fevers, chills, fatigue, or  lassitude.  HEENT:   No headaches,   Difficulty swallowing,  Tooth/dental problems, or  Sore throat,                No sneezing, itching, ear ache, + nasal congestion, post nasal drip,   CV:  No chest pain,  Orthopnea, PND, swelling in lower extremities, anasarca, dizziness, palpitations, syncope.   GI  No heartburn, indigestion, abdominal pain, nausea, vomiting, diarrhea, change in bowel habits, loss of appetite, bloody stools.   Resp:  .  No chest wall deformity  Skin: no rash or lesions.  GU: no dysuria, change in color of urine, no urgency or frequency.  No flank pain, no hematuria   MS:  No joint pain or swelling.  No decreased range of motion.  No back pain.    Physical Exam  BP 118/68 (BP Location: Right Arm, Cuff Size: Normal)   Pulse 94   Ht 5\' 2"  (1.575 m)   Wt 147 lb (66.7 kg)   SpO2 95%   BMI 26.89 kg/m   GEN: A/Ox3; pleasant , NAD    HEENT:  /AT,  EACs-clear, TMs-wnl, NOSE-clear, THROAT-clear, no lesions, no postnasal drip or exudate noted.   NECK:  Supple w/ fair ROM; no JVD; normal carotid impulses w/o bruits; no thyromegaly or nodules palpated; no lymphadenopathy.    RESP few scattered rhonchi no accessory muscle use, no dullness to percussion  CARD:  RRR, no m/r/g, no peripheral edema, pulses intact, no cyanosis or clubbing.  GI:   Soft & nt; nml bowel sounds; no organomegaly or masses detected.   Musco: Warm bil, no deformities or joint swelling noted.   Neuro: alert, no focal deficits noted.    Skin: Warm, no lesions or rashes    Lab Results:  CBC  BMET  Imaging: No results found.   Assessment & Plan:   Allergic rhinitis Controlled on current regimen  Hypogammaglobulinemia, acquired ([place]) Continue follow-up with hematology  Severe persistent asthma with acute exacerbation Recent exacerbation now resolved.  Patient appears to be back to baseline.  She is continue on her current regimen.  Sinusitis, chronic Continue on current regimen     Rexene Edison,  NP 02/07/2017

## 2017-02-07 NOTE — Assessment & Plan Note (Signed)
Continue follow up with hematology.

## 2017-02-07 NOTE — Patient Instructions (Signed)
Continue on current regimen  Follow up with Dr. Byrum  In 6 months and As needed    

## 2017-02-07 NOTE — Assessment & Plan Note (Signed)
Continue on current regimen .   

## 2017-02-08 DIAGNOSIS — D2261 Melanocytic nevi of right upper limb, including shoulder: Secondary | ICD-10-CM | POA: Diagnosis not present

## 2017-02-08 DIAGNOSIS — L821 Other seborrheic keratosis: Secondary | ICD-10-CM | POA: Diagnosis not present

## 2017-02-08 DIAGNOSIS — D485 Neoplasm of uncertain behavior of skin: Secondary | ICD-10-CM | POA: Diagnosis not present

## 2017-02-08 DIAGNOSIS — D2272 Melanocytic nevi of left lower limb, including hip: Secondary | ICD-10-CM | POA: Diagnosis not present

## 2017-02-08 DIAGNOSIS — D225 Melanocytic nevi of trunk: Secondary | ICD-10-CM | POA: Diagnosis not present

## 2017-02-08 DIAGNOSIS — Z85828 Personal history of other malignant neoplasm of skin: Secondary | ICD-10-CM | POA: Diagnosis not present

## 2017-02-08 DIAGNOSIS — L570 Actinic keratosis: Secondary | ICD-10-CM | POA: Diagnosis not present

## 2017-02-16 ENCOUNTER — Telehealth: Payer: Self-pay | Admitting: Adult Health

## 2017-02-16 NOTE — Telephone Encounter (Signed)
TP received mail from [place] that [name] is no longer covered No alternatives listed, will try Ventolin  Called spoke with patient to discuss the above - pt okay with switching to Ventolin  While speaking with patient she also mentioned that she went to refill her [facility] yesterday and it is no longer covered by insurance either >> cost is now $300.  Patient stated she has tried and failed QVAR and Advair.

## 2017-02-17 NOTE — Telephone Encounter (Signed)
Reserve but was unable to complete the call.  Will call back Monday.

## 2017-02-21 NOTE — Telephone Encounter (Signed)
Per TP: okay to change to Symbicort 160-4.68mcg 2 puffs BID, rinse after use.  Thank you.

## 2017-02-21 NOTE — Telephone Encounter (Signed)
Called [employer] and spoke with Vaughan Basta in their insurance department  Per Vaughan Basta, pt's Ruthe Mannan is a Tier 4 which is costing patient #395.95 for a 3 month supply and $138.99 for a 1 month supply  Vaughan Basta was able to check patient's formulary for Tier 3 alternatives which will cost pt $80: Advair diskus and HFA, Symbicort, Breo   Patient was last seen 2.5.19 by TP Tammy please advise if one of these alternatives would be appropriate for patient, thanks

## 2017-02-22 MED ORDER — BUDESONIDE-FORMOTEROL FUMARATE 160-4.5 MCG/ACT IN AERO: 2.0000 | INHALATION_SPRAY | Freq: Two times a day (BID) | RESPIRATORY_TRACT | 5 refills | Status: DC

## 2017-02-22 NOTE — Telephone Encounter (Signed)
Called spoke with patient and discussed TP's recommendations to change to Symbicort Pt okay with the switch, understands usage directions and to brush/rinse/gargle after use Pt aware to contact the office if the Symbicort does not manager her symptoms  Med list updated Rx sent to [facility] Nothing further needed; will sign off

## 2017-02-23 ENCOUNTER — Ambulatory Visit (HOSPITAL_COMMUNITY): Payer: Medicare Other

## 2017-02-23 ENCOUNTER — Ambulatory Visit (HOSPITAL_COMMUNITY): Payer: Medicare Other | Admitting: Internal Medicine

## 2017-02-23 ENCOUNTER — Other Ambulatory Visit (HOSPITAL_COMMUNITY): Payer: Medicare Other

## 2017-02-28 ENCOUNTER — Inpatient Hospital Stay (HOSPITAL_COMMUNITY): Payer: Medicare Other

## 2017-02-28 ENCOUNTER — Ambulatory Visit (HOSPITAL_COMMUNITY): Payer: Medicare Other

## 2017-02-28 ENCOUNTER — Encounter (HOSPITAL_COMMUNITY): Payer: Self-pay

## 2017-02-28 ENCOUNTER — Other Ambulatory Visit: Payer: Self-pay

## 2017-02-28 ENCOUNTER — Other Ambulatory Visit (HOSPITAL_COMMUNITY): Payer: Medicare Other

## 2017-02-28 ENCOUNTER — Inpatient Hospital Stay (HOSPITAL_COMMUNITY): Payer: Medicare Other | Attending: Internal Medicine | Admitting: Internal Medicine

## 2017-02-28 VITALS — BP 136/72 | HR 73 | Temp 98.6°F | Resp 18 | Wt 149.6 lb

## 2017-02-28 DIAGNOSIS — Z9842 Cataract extraction status, left eye: Secondary | ICD-10-CM | POA: Insufficient documentation

## 2017-02-28 DIAGNOSIS — D509 Iron deficiency anemia, unspecified: Secondary | ICD-10-CM | POA: Diagnosis not present

## 2017-02-28 DIAGNOSIS — E538 Deficiency of other specified B group vitamins: Secondary | ICD-10-CM | POA: Diagnosis not present

## 2017-02-28 DIAGNOSIS — C8338 Diffuse large B-cell lymphoma, lymph nodes of multiple sites: Secondary | ICD-10-CM | POA: Insufficient documentation

## 2017-02-28 DIAGNOSIS — Z79899 Other long term (current) drug therapy: Secondary | ICD-10-CM | POA: Diagnosis not present

## 2017-02-28 DIAGNOSIS — K219 Gastro-esophageal reflux disease without esophagitis: Secondary | ICD-10-CM | POA: Diagnosis not present

## 2017-02-28 DIAGNOSIS — I10 Essential (primary) hypertension: Secondary | ICD-10-CM | POA: Diagnosis not present

## 2017-02-28 DIAGNOSIS — Z86010 Personal history of colonic polyps: Secondary | ICD-10-CM | POA: Insufficient documentation

## 2017-02-28 DIAGNOSIS — K317 Polyp of stomach and duodenum: Secondary | ICD-10-CM | POA: Insufficient documentation

## 2017-02-28 DIAGNOSIS — G4733 Obstructive sleep apnea (adult) (pediatric): Secondary | ICD-10-CM | POA: Diagnosis not present

## 2017-02-28 DIAGNOSIS — D801 Nonfamilial hypogammaglobulinemia: Secondary | ICD-10-CM

## 2017-02-28 DIAGNOSIS — E785 Hyperlipidemia, unspecified: Secondary | ICD-10-CM | POA: Diagnosis not present

## 2017-02-28 DIAGNOSIS — R911 Solitary pulmonary nodule: Secondary | ICD-10-CM

## 2017-02-28 LAB — COMPREHENSIVE METABOLIC PANEL
ALBUMIN: 3.5 g/dL (ref 3.5–5.0)
ALT: 10 U/L — ABNORMAL LOW (ref 14–54)
ANION GAP: 10 (ref 5–15)
AST: 18 U/L (ref 15–41)
Alkaline Phosphatase: 97 U/L (ref 38–126)
BILIRUBIN TOTAL: 0.6 mg/dL (ref 0.3–1.2)
BUN: 17 mg/dL (ref 6–20)
CO2: 28 mmol/L (ref 22–32)
Calcium: 8.9 mg/dL (ref 8.9–10.3)
Chloride: 97 mmol/L — ABNORMAL LOW (ref 101–111)
Creatinine, Ser: 1 mg/dL (ref 0.44–1.00)
GFR calc Af Amer: >60 mL/min (ref >60)
GFR calc non Af Amer: 59 mL/min — ABNORMAL LOW (ref >60)
GLUCOSE: 121 mg/dL — AB (ref 65–99)
POTASSIUM: 3.6 mmol/L (ref 3.5–5.1)
SODIUM: 135 mmol/L (ref 135–145)
TOTAL PROTEIN: 7.1 g/dL (ref 6.5–8.1)

## 2017-02-28 LAB — CBC WITH DIFFERENTIAL/PLATELET
Basophils Absolute: 0 10*3/uL (ref 0.0–0.1)
Basophils Relative: 0 %
EOS ABS: 0.1 10*3/uL (ref 0.0–0.7)
EOS PCT: 1 %
HCT: 36.6 % (ref 36.0–46.0)
Hemoglobin: 11.4 g/dL — ABNORMAL LOW (ref 12.0–15.0)
Lymphocytes Relative: 6 %
Lymphs Abs: 0.8 10*3/uL (ref 0.7–4.0)
MCH: 29.2 pg (ref 26.0–34.0)
MCHC: 31.1 g/dL (ref 30.0–36.0)
MCV: 93.8 fL (ref 78.0–100.0)
MONO ABS: 0.8 10*3/uL (ref 0.1–1.0)
MONOS PCT: 6 %
Neutro Abs: 11.2 10*3/uL — ABNORMAL HIGH (ref 1.7–7.7)
Neutrophils Relative %: 87 %
PLATELETS: 172 10*3/uL (ref 150–400)
RBC: 3.9 MIL/uL (ref 3.87–5.11)
RDW: 13.3 % (ref 11.5–15.5)
WBC: 12.9 10*3/uL — ABNORMAL HIGH (ref 4.0–10.5)

## 2017-02-28 LAB — LACTATE DEHYDROGENASE: LDH: 150 U/L (ref 98–192)

## 2017-02-28 MED ORDER — IMMUNE GLOBULIN (HUMAN) 10 GM/100ML IV SOLN
50.0000 g | Freq: Once | INTRAVENOUS | Status: AC
  Administered 2017-02-28: 50 g via INTRAVENOUS
  Filled 2017-02-28: qty 400

## 2017-02-28 MED ORDER — DEXTROSE 5 % IV SOLN
INTRAVENOUS | Status: AC
  Administered 2017-02-28: 10:00:00 via INTRAVENOUS

## 2017-02-28 MED ORDER — DIPHENHYDRAMINE HCL 25 MG PO CAPS
25.0000 mg | ORAL_CAPSULE | Freq: Once | ORAL | Status: AC
  Filled 2017-02-28: qty 1

## 2017-02-28 MED ORDER — ACETAMINOPHEN 325 MG PO TABS: 650.0000 mg | ORAL_TABLET | Freq: Four times a day (QID) | ORAL | Status: AC | PRN

## 2017-02-28 NOTE — Patient Instructions (Signed)
[place] at [facility] Discharge Instructions  RECOMMENDATIONS MADE BY THE CONSULTANT AND ANY TEST RESULTS WILL BE SENT TO YOUR REFERRING PHYSICIAN.  You were seen today by Dr. Zoila Shutter We will get you scheduled for a PET scan Continue monthly infusions Follow up in 3 months   Thank you for choosing [place] at [facility] to provide your oncology and hematology care.  To afford each patient quality time with our provider, please arrive at least 15 minutes before your scheduled appointment time.    If you have a lab appointment with the [facility] please come in thru the  Main Entrance and check in at the main information desk  You need to re-schedule your appointment should you arrive 10 or more minutes late.  We strive to give you quality time with our providers, and arriving late affects you and other patients whose appointments are after yours.  Also, if you no show three or more times for appointments you may be dismissed from the clinic at the providers discretion.     Again, thank you for choosing [facility].  Our hope is that these requests will decrease the amount of time that you wait before being seen by our physicians.       _____________________________________________________________  Should you have questions after your visit to [facility], please contact our office at [phone] between the hours of 8:30 a.m. and 4:30 p.m.  Voicemails left after 4:30 p.m. will not be returned until the following business day.  For prescription refill requests, have your pharmacy contact our office.       Resources For Cancer Patients and their Caregivers ? American Cancer Society: Can assist with transportation, wigs, general needs, runs Look Good Feel Better.        978-448-1136 ? Cancer Care: Provides financial assistance, online support groups, medication/co-pay assistance.  1-800-813-HOPE  6082782285) ? [place] Assists [place] cancer patients and their families through emotional , educational and financial support.  559 621 0871 ? [place] DSS Where to apply for food stamps, Medicaid and utility assistance. 351-142-5453 ? [facility]: Transportation to medical appointments. 9864114445 ? Social Security Administration: May apply for disability if have a Stage IV cancer. 435-175-7200 (716) 071-9584 ? [employer], Disability and Transit Services: Assists with nutrition, care and transit needs. [place] Support Programs: @10RELATIVEDAYS @ > Cancer Support Group  2nd Tuesday of the month 1pm-2pm, Journey Room  > Creative Journey  3rd Tuesday of the month 1130am-1pm, Journey Room  > Look Good Feel Better  1st Wednesday of the month 10am-12 noon, Journey Room (Call [place] to register [phone])

## 2017-02-28 NOTE — Progress Notes (Signed)
Treatment given per orders. Patient tolerated it well without problems. Vitals stable and discharged home from clinic ambulatory. Follow up as scheduled.  

## 2017-03-01 LAB — IGG, IGA, IGM
IGA: 6 mg/dL — AB (ref 87–352)
IGM (IMMUNOGLOBULIN M), SRM: 84 mg/dL (ref 26–217)
IgG (Immunoglobin G), Serum: 1121 mg/dL (ref 700–1600)

## 2017-03-13 NOTE — Progress Notes (Signed)
Diagnosis Diffuse large B-cell lymphoma of lymph nodes of multiple regions ([place]) - Plan: NM PET Image Restag (PS) Skull Base To Thigh, CBC with Differential/Platelet, Comprehensive metabolic panel, Lactate dehydrogenase  Staging Cancer Staging Non Hodgkin's lymphoma ([place]) Staging form: Lymphoid Neoplasms, AJCC 6th Edition - Clinical: Stage IV - Signed by Baird Cancer, PA on 11/05/2010   Assessment and Plan:  1.Hypogammaglobulinemia.  Pt will continue IVIG monthly  as directed previously by Dr. Talbert Cage. Pending scan results pt will RTC in 3 months   2. Diffuse large B-cell lymphoma,  Pt currently on observation  Will repeat PET based on Pet done 2/15 showing pulmonary nodule.    3.  Pulmonary nodule.  This was noted on PET scan done 2015.  She will be set up for PET scan for further evaluation of pulmonary nodule.    4.  Anemia with h/o iron deficiency- Recent HB 02/29/2016 11.4.  Will continue to monitor labs.  She has undergone Colonoscopy in 2014 with multiple polyps.  Follow-up with GI as directed.      Interval History:  Hypogammaglobulenemia/IgG deficiency with level of 414 mg/dl Maintenance Rituxan 09/30/2011 Diffuse large B-Cell Lymphoma, S/P R-CHOP x 4 cycles with intrathecal chemotherapy during cycles 3 and 4 with methotrexate and Solu-Cortef. Treatment was stopped due to Pseudomonas sepsis after cycles 2 and 4 which was very severe  CURRENT THERAPY:50 g IV IgG monthly for IgG deficiency.  INTERVAL HISTORY: 62  y.o. Female seen for follow-up of  IgG deficiency and DLBCL.       Non Hodgkin's lymphoma ([place])   09/09/2009 Initial Diagnosis    Non Hodgkin's lymphoma ([place])       Problem List Patient Active Problem List   Diagnosis Date Noted  . Peripheral neuropathy due to chemotherapy ([place]) [G62.0, T45.1X5A] 05/02/2016  . Recurrent herpes labialis [B00.1] 05/02/2016  . Osteoporosis [M81.0] 04/30/2015  . Narcolepsy without cataplexy(347.00) [G47.419] 01/18/2015  . Iron  deficiency anemia [D50.9] 01/18/2015  . B12 deficiency [E53.8] 01/18/2015  . SVC syndrome [I87.1] 09/25/2014  . Sinusitis, chronic [J32.9] 05/06/2014  . Abnormal finding on imaging [R93.89] 02/11/2014  . OSA (obstructive sleep apnea) [G47.33] 09/10/2012  . 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 Hypogammaglobulinemia, acquired ([place]) [D80.1] 12/30/2009  . Non Hodgkin's lymphoma ([place]) [C85.90] 09/09/2009  . HLD (hyperlipidemia) [E78.5] 04/21/2007  . Essential hypertension [I10] 04/21/2007  . DIABETES MELLITUS, BORDERLINE [R73.09] 04/21/2007  . Severe persistent asthma with acute exacerbation [J45.51] 04/03/2007  . Allergic rhinitis [J30.9] 10/19/2006  . VOCAL CORD DISORDER [J38.3] 10/19/2006  . GERD [K21.9] 10/19/2006  . GASTRIC POLYP [D13.1] 07/25/2006    Past Medical History Past Medical History:  Diagnosis Date  . Allergic rhinitis   . Allergy   . Anemia   . Anxiety   . Asthma   . B12 deficiency 01/18/2015  . Blood transfusion without reported diagnosis   . Cataract   . Cavitary lung disease   . Chronic kidney disease    related to cancer  . Chronic sinusitis   . Clotting disorder ([place])   . COPD (chronic obstructive pulmonary disease) ([place])   . Depression   . DVT (deep venous thrombosis) ([place]) 09/25/2014  . Endotracheally intubated   . GERD (gastroesophageal reflux disease)   . Hematuria 04/07/2016  . Hyperlipidemia   . Hypertension   . Hypogammaglobulinemia, acquired ([place]) 12/30/2009   Qualifier: Diagnosis of  By: Joya Gaskins MD, Burnett Harry   . Iron deficiency anemia 01/18/2015  . Myocardial  infarction ([place]) 2011  . 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 Non Hodgkin's lymphoma ([place]) 2011  . Orofacial dyskinesia 04/22/2015  . Orofacial dyskinesia 04/22/2015  . Osteoporosis   . Peripheral neuropathy due to chemotherapy ([place]) 05/02/2016  . Pneumonia 01/2013  . PONV (postoperative nausea and vomiting)   . Respiratory failure  ([place])   . Sleep apnea   . SVC syndrome 09/25/2014    Past Surgical History Past Surgical History:  Procedure Laterality Date  . ABDOMINAL HYSTERECTOMY     Fibroids  . BASAL CELL CARCINOMA EXCISION  03/2011   scalp  . BREAST SURGERY Right    biopsy  . CATARACT EXTRACTION W/PHACO Left 11/17/2014   Procedure: CATARACT EXTRACTION PHACO AND INTRAOCULAR LENS PLACEMENT LEFT EYE;  Surgeon: Tonny Branch, MD;  Location: [facility];  Service: Ophthalmology;  Laterality: Left;  CDE:5.60  . CATARACT EXTRACTION W/PHACO Right 12/15/2014   Procedure: CATARACT EXTRACTION PHACO AND INTRAOCULAR LENS PLACEMENT RIGHT EYE CDE=5.16;  Surgeon: Tonny Branch, MD;  Location: [facility];  Service: Ophthalmology;  Laterality: Right;  . COLONOSCOPY N/A 11/14/2012   Procedure: COLONOSCOPY;  Surgeon: Rogene Houston, MD;  Location: [facility];  Service: Endoscopy;  Laterality: N/A;  830  . EYE SURGERY    . NASAL SINUS SURGERY    . NECK SURGERY     x 2   . PERIPHERALLY INSERTED CENTRAL CATHETER INSERTION    . PICC Removal    . PORT-A-CATH REMOVAL    . PORTACATH PLACEMENT  7/11   Removed 6/12  . TRACHEOSTOMY    . VESICOVAGINAL FISTULA CLOSURE W/ TAH      Family History Family History  Problem Relation Age of Onset  . Emphysema Mother   . Stroke Mother   . COPD Mother   . Heart disease Mother        died in sleep  63  . Allergies Father   . Asthma Father        as a child  . Arthritis Father   . Parkinson's disease Father 16  . Leukemia Maternal Grandmother   . Cancer Maternal Grandmother        Leukemia  . Diabetes Brother   . Heart attack Brother   . Hypertension Brother   . Heart disease Brother 40       stents  . Hypertension Sister   . Hypertension Brother      Social History  reports that  has never smoked. she has never used smokeless tobacco. She reports that she does not drink alcohol or use drugs.  Medications  Current Outpatient Medications:  .  acetaminophen (TYLENOL) 500 MG tablet,  Take 1,000 mg by mouth every 6 (six) hours as needed for pain or fever. , Disp: , Rfl:  .  acyclovir (ZOVIRAX) 400 MG tablet, TAKE ONE TABLET BY MOUTH TWICE DAILY., Disp: 60 tablet, Rfl: 3 .  albuterol (PROAIR HFA) 108 (90 Base) MCG/ACT inhaler, Inhale 2 puffs into the lungs every 6 (six) hours as needed., Disp: 8.5 g, Rfl: 2 .  albuterol (PROVENTIL) (2.5 MG/3ML) 0.083% nebulizer solution, Take 2.5 mg by nebulization every 4 (four) hours as needed for wheezing or shortness of breath., Disp: , Rfl:  .  alendronate (FOSAMAX) 70 MG tablet, Take 1 tablet (70 mg total) by mouth once a week. Take with a full glass of water on an empty stomach., Disp: 12 tablet, Rfl: 3 .  amphetamine-dextroamphetamine (ADDERALL) 10 MG tablet, Take 10 mg by mouth daily., Disp: ,  Rfl: 0 .  Armodafinil (NUVIGIL) 150 MG tablet, Take 150 mg by mouth daily., Disp: , Rfl:  .  budesonide-formoterol (SYMBICORT) 160-4.5 MCG/ACT inhaler, Inhale 2 puffs into the lungs 2 (two) times daily., Disp: 1 Inhaler, Rfl: 5 .  citalopram (CELEXA) 20 MG tablet, Take 20 mg by mouth every morning.  , Disp: , Rfl:  .  cyanocobalamin 1000 MCG tablet, Take 1,000 mcg by mouth daily., Disp: , Rfl:  .  fluticasone (FLONASE) 50 MCG/ACT nasal spray, , Disp: , Rfl: 10 .  furosemide (LASIX) 20 MG tablet, Take 1 tablet (20 mg total) by mouth as needed for fluid., Disp: 90 tablet, Rfl: 1 .  gabapentin (NEURONTIN) 600 MG tablet, , Disp: , Rfl: 1 .  omeprazole (PRILOSEC) 20 MG capsule, TAKE ONE CAPSULE BY MOUTH TWICE DAILY BEFORE A MEAL., Disp: 180 capsule, Rfl: 1 .  Potassium Chloride 40 MEQ/15ML (20%) SOLN, Take 20 mEq by mouth 2 (two) times daily. 20 mEq equals 7.5 mL, Disp: 473 mL, Rfl: 2 .  predniSONE (DELTASONE) 20 MG tablet, Take 1 tablet (20 mg total) by mouth 2 (two) times daily with a meal., Disp: 10 tablet, Rfl: 1 .  predniSONE (DELTASONE) 5 MG tablet, , Disp: , Rfl: 5 .  simvastatin (ZOCOR) 80 MG tablet, Take 1 tablet (80 mg total) by mouth at  bedtime., Disp: 90 tablet, Rfl: 3 .  tiotropium (SPIRIVA) 18 MCG inhalation capsule, Place 1 capsule (18 mcg total) into inhaler and inhale daily., Disp: 30 capsule, Rfl: 3 .  zafirlukast (ACCOLATE) 20 MG tablet, Take 1 tablet (20 mg total) by mouth 2 (two) times daily., Disp: 60 tablet, Rfl: 11 No current facility-administered medications for this visit.   Facility-Administered Medications Ordered in Other Visits:  .  0.9 %  sodium chloride infusion, , Intravenous, Once, Kefalas, Thomas S, PA-C .  0.9 %  sodium chloride infusion, , Intravenous, Once, Kefalas, Thomas S, PA-C .  acetaminophen (TYLENOL) tablet 650 mg, 650 mg, Oral, Q6H PRN, Kefalas, Thomas S, PA-C .  acetaminophen (TYLENOL) tablet 650 mg, 650 mg, Oral, Q6H PRN, Kefalas, Thomas S, PA-C .  dextrose 5 % solution, , Intravenous, Continuous, Kefalas, Manon Hilding, PA-C, Stopped at 08/25/15 1400 .  dextrose 5 % solution, , Intravenous, Continuous, Kefalas, Manon Hilding, PA-C, Stopped at 07/13/16 1350 .  dextrose 5 % solution, , Intravenous, Continuous, Holley Bouche, NP, Stopped at 02/28/17 1230 .  diphenhydrAMINE (BENADRYL) capsule 25 mg, 25 mg, Oral, Once, Kefalas, Thomas S, PA-C .  diphenhydrAMINE (BENADRYL) capsule 25 mg, 25 mg, Oral, Once, Kefalas, Thomas S, PA-C .  sodium chloride 0.9 % injection 10 mL, 10 mL, Intracatheter, PRN, Kefalas, Manon Hilding, PA-C, 10 mL at 08/25/15 1125  Allergies Meperidine hcl and Montelukast sodium  Review of Systems Review of Systems - Oncology ROS as per HPI otherwise 12 point ROS is negative.   Physical Exam  Vitals Wt Readings from Last 3 Encounters:  02/28/17 149 lb 9.6 oz (67.9 kg)  02/07/17 147 lb (66.7 kg)  01/23/17 146 lb (66.2 kg)   Temp Readings from Last 3 Encounters:  02/28/17 98.6 F (37 C) (Oral)  02/28/17 98.6 F (37 C) (Oral)  01/23/17 98.2 F (36.8 C) (Oral)   BP Readings from Last 3 Encounters:  02/28/17 (!) 149/74  02/28/17 136/72  02/07/17 118/68   Pulse  Readings from Last 3 Encounters:  02/28/17 78  02/28/17 73  02/07/17 94    Constitutional: Well-developed, well-nourished, and in no distress.  HENT: Head: Normocephalic and atraumatic.  Mouth/Throat: No oropharyngeal exudate. Mucosa moist. Eyes: Pupils are equal, round, and reactive to light. Conjunctivae are normal. No scleral icterus.  Neck: Normal range of motion. Neck supple. No JVD present.  Cardiovascular: Normal rate, regular rhythm and normal heart sounds.  Exam reveals no gallop and no friction rub.   No murmur heard. Pulmonary/Chest: Effort normal and breath sounds normal. No respiratory distress. No wheezes.No rales.  Abdominal: Soft. Bowel sounds are normal. No distension. There is no tenderness. There is no guarding.  Musculoskeletal: No edema or tenderness.  Lymphadenopathy: No cervical, axillary or supraclavicular adenopathy.  Neurological: Alert and oriented to person, place, and time. No cranial nerve deficit.  Skin: Skin is warm and dry. No rash noted. No erythema. No pallor.  Psychiatric: Affect and judgment normal.   Labs Appointment on 02/28/2017  Component Date Value Ref Range Status  . WBC 02/28/2017 12.9* 4.0 - 10.5 K/uL Final  . RBC 02/28/2017 3.90  3.87 - 5.11 MIL/uL Final  . Hemoglobin 02/28/2017 11.4* 12.0 - 15.0 g/dL Final  . HCT 02/28/2017 36.6  36.0 - 46.0 % Final  . MCV 02/28/2017 93.8  78.0 - 100.0 fL Final  . MCH 02/28/2017 29.2  26.0 - 34.0 pg Final  . MCHC 02/28/2017 31.1  30.0 - 36.0 g/dL Final  . RDW 02/28/2017 13.3  11.5 - 15.5 % Final  . Platelets 02/28/2017 172  150 - 400 K/uL Final  . Neutrophils Relative % 02/28/2017 87  % Final  . Neutro Abs 02/28/2017 11.2* 1.7 - 7.7 K/uL Final  . Lymphocytes Relative 02/28/2017 6  % Final  . Lymphs Abs 02/28/2017 0.8  0.7 - 4.0 K/uL Final  . Monocytes Relative 02/28/2017 6  % Final  . Monocytes Absolute 02/28/2017 0.8  0.1 - 1.0 K/uL Final  . Eosinophils Relative 02/28/2017 1  % Final  .  Eosinophils Absolute 02/28/2017 0.1  0.0 - 0.7 K/uL Final  . Basophils Relative 02/28/2017 0  % Final  . Basophils Absolute 02/28/2017 0.0  0.0 - 0.1 K/uL Final   Performed at [facility], [address] [place], [place] [address]  . Sodium 02/28/2017 135  135 - 145 mmol/L Final  . Potassium 02/28/2017 3.6  3.5 - 5.1 mmol/L Final  . Chloride 02/28/2017 97* 101 - 111 mmol/L Final  . CO2 02/28/2017 28  22 - 32 mmol/L Final  . Glucose, Bld 02/28/2017 121* 65 - 99 mg/dL Final  . BUN 02/28/2017 17  6 - 20 mg/dL Final  . Creatinine, Ser 02/28/2017 1.00  0.44 - 1.00 mg/dL Final  . Calcium 02/28/2017 8.9  8.9 - 10.3 mg/dL Final  . Total Protein 02/28/2017 7.1  6.5 - 8.1 g/dL Final  . Albumin 02/28/2017 3.5  3.5 - 5.0 g/dL Final  . AST 02/28/2017 18  15 - 41 U/L Final  . ALT 02/28/2017 10* 14 - 54 U/L Final  . Alkaline Phosphatase 02/28/2017 97  38 - 126 U/L Final  . Total Bilirubin 02/28/2017 0.6  0.3 - 1.2 mg/dL Final  . GFR calc non Af Amer 02/28/2017 59* >60 mL/min Final  . GFR calc Af Amer 02/28/2017 >60  >60 mL/min Final   Comment: (NOTE) The eGFR has been calculated using the CKD EPI equation. This calculation has not been validated in all clinical situations. eGFR's persistently <60 mL/min signify possible Chronic Kidney Disease.   . Anion gap 02/28/2017 10  5 - 15 Final   Performed at [facility], [address], [place], [place] [address]  . IgG (Immunoglobin G), Serum 02/28/2017 1,121  700 - 1,600 mg/dL Final  . IgA 02/28/2017 6* 87 - 352 mg/dL Final   Result confirmed on concentration.  . IgM (Immunoglobulin M), Srm 02/28/2017 84  26 - 217 mg/dL Final   Comment: (NOTE) Performed At: [facility] [place], [place] [id number] Rush Farmer MD [id number] Performed at [facility], [address] [place], [place] [address]   . LDH 02/28/2017 150  98 - 192 U/L Final   Performed at [facility], [address] [place], [place] [address]      Pathology Orders Placed This Encounter  Procedures  . NM PET Image Restag (PS) Skull Base To Thigh    Standing Status:   Future    Standing Expiration Date:   02/28/2018    Order Specific Question:   If indicated for the ordered procedure, I authorize the administration of a radiopharmaceutical per Radiology protocol    Answer:   Yes    Order Specific Question:   Preferred imaging location?    Answer:   [facility]    Order Specific Question:   Radiology Contrast Protocol - do NOT remove file path    Answer:   [email or web address]  . CBC with Differential/Platelet    Standing Status:   Future    Standing Expiration Date:   02/28/2018  . Comprehensive metabolic panel    Standing Status:   Future    Standing Expiration Date:   02/28/2018  . Lactate dehydrogenase    Standing Status:   Future    Standing Expiration Date:   02/28/2018   Pet scan done 6/22/ 2015:    IMPRESSION: 1. Hypermetabolic left lower lobe nodule. Lesion is new from 11/09/2012 and may be inflammatory, given surrounding pulmonary parenchymal changes suggestive of post infectious scarring or mycobacterium avium complex (MAC). However, adenocarcinoma cannot be definitively excluded. Recommend CT chest without contrast in 3 months in further evaluation. 2. A few scattered mildly hypermetabolic hazy subcutaneous nodules are stable. 3. Air-fluid levels in the paranasal sinuses   Zoila Shutter MD

## 2017-03-20 ENCOUNTER — Ambulatory Visit (HOSPITAL_COMMUNITY)
Admission: RE | Admit: 2017-03-20 | Discharge: 2017-03-20 | Disposition: A | Discharge status: Home / Self Care | Payer: Medicare Other | Source: Ambulatory Visit | Attending: Internal Medicine | Admitting: Internal Medicine

## 2017-03-20 DIAGNOSIS — C8338 Diffuse large B-cell lymphoma, lymph nodes of multiple sites: Secondary | ICD-10-CM | POA: Insufficient documentation

## 2017-03-20 DIAGNOSIS — C8510 Unspecified B-cell lymphoma, unspecified site: Secondary | ICD-10-CM | POA: Diagnosis not present

## 2017-03-20 MED ORDER — FLUDEOXYGLUCOSE F - 18 (FDG) INJECTION
10.3200 | Freq: Once | INTRAVENOUS | Status: AC
  Administered 2017-03-20: 10.32 via INTRAVENOUS

## 2017-03-24 ENCOUNTER — Ambulatory Visit (HOSPITAL_COMMUNITY): Payer: Medicare Other | Admitting: Internal Medicine

## 2017-03-24 ENCOUNTER — Ambulatory Visit (HOSPITAL_COMMUNITY): Payer: Medicare Other

## 2017-03-24 ENCOUNTER — Other Ambulatory Visit (HOSPITAL_COMMUNITY): Payer: Medicare Other

## 2017-03-25 IMAGING — DX DG KNEE COMPLETE 4+V*R*
4 series · 4 of 4 positions shown · non-contrast
Comparison: None.

CLINICAL DATA: Right knee pain no injury

EXAM:
RIGHT KNEE - COMPLETE 4+ VIEW

[knee ap]
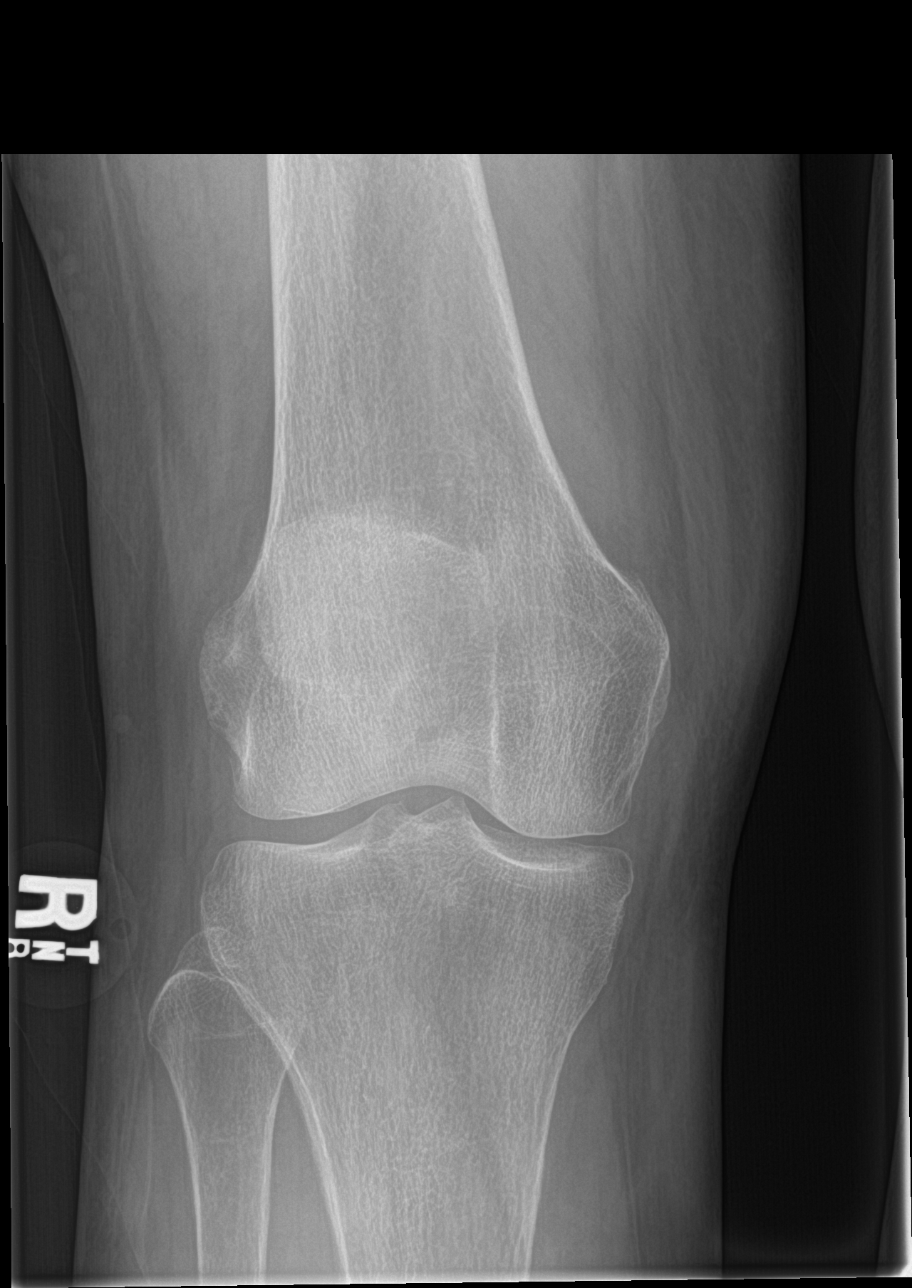

[tunnel]
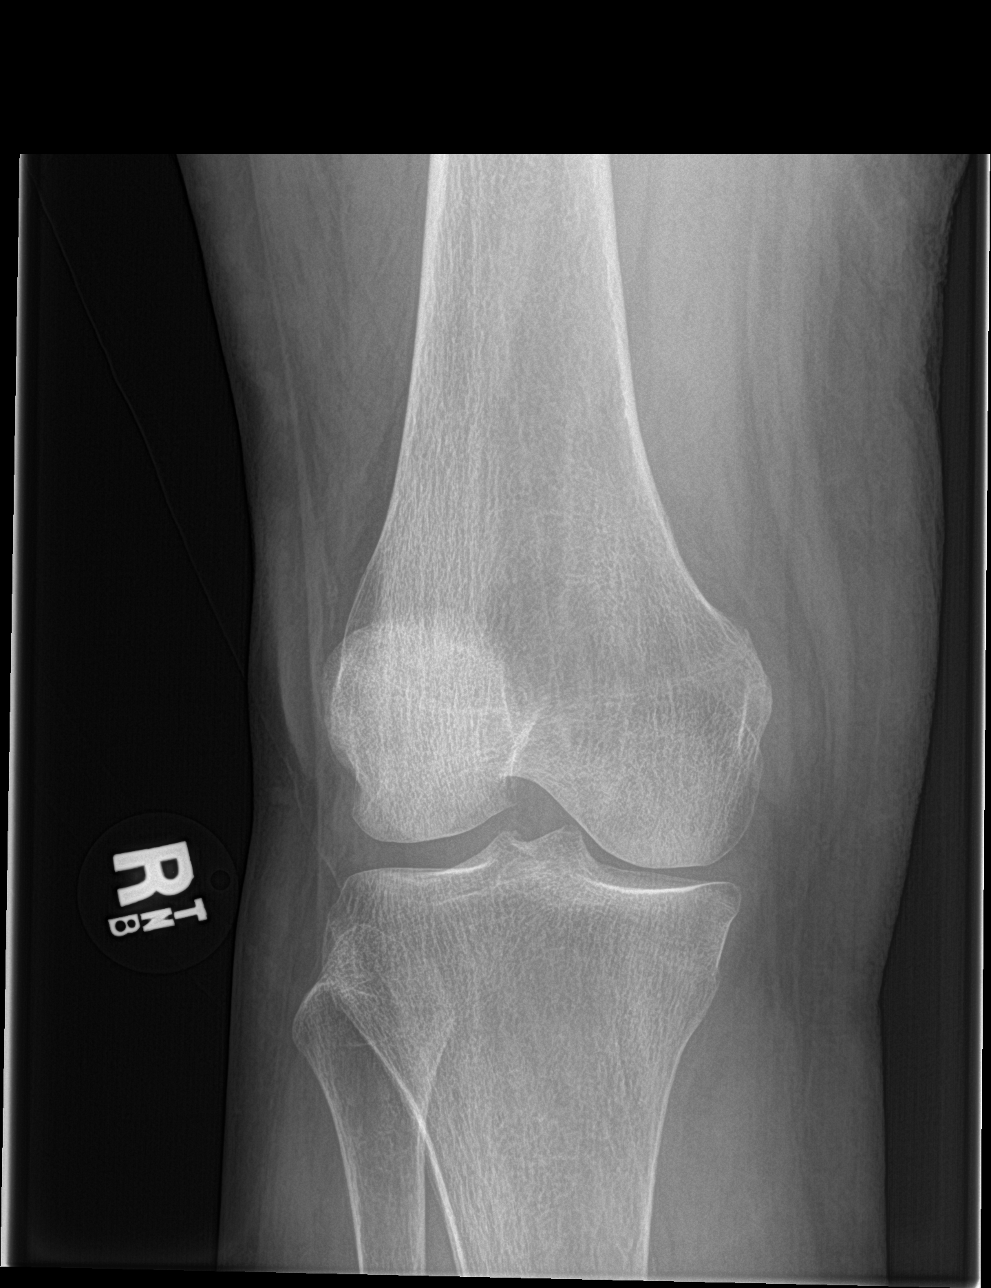

[knee lat]
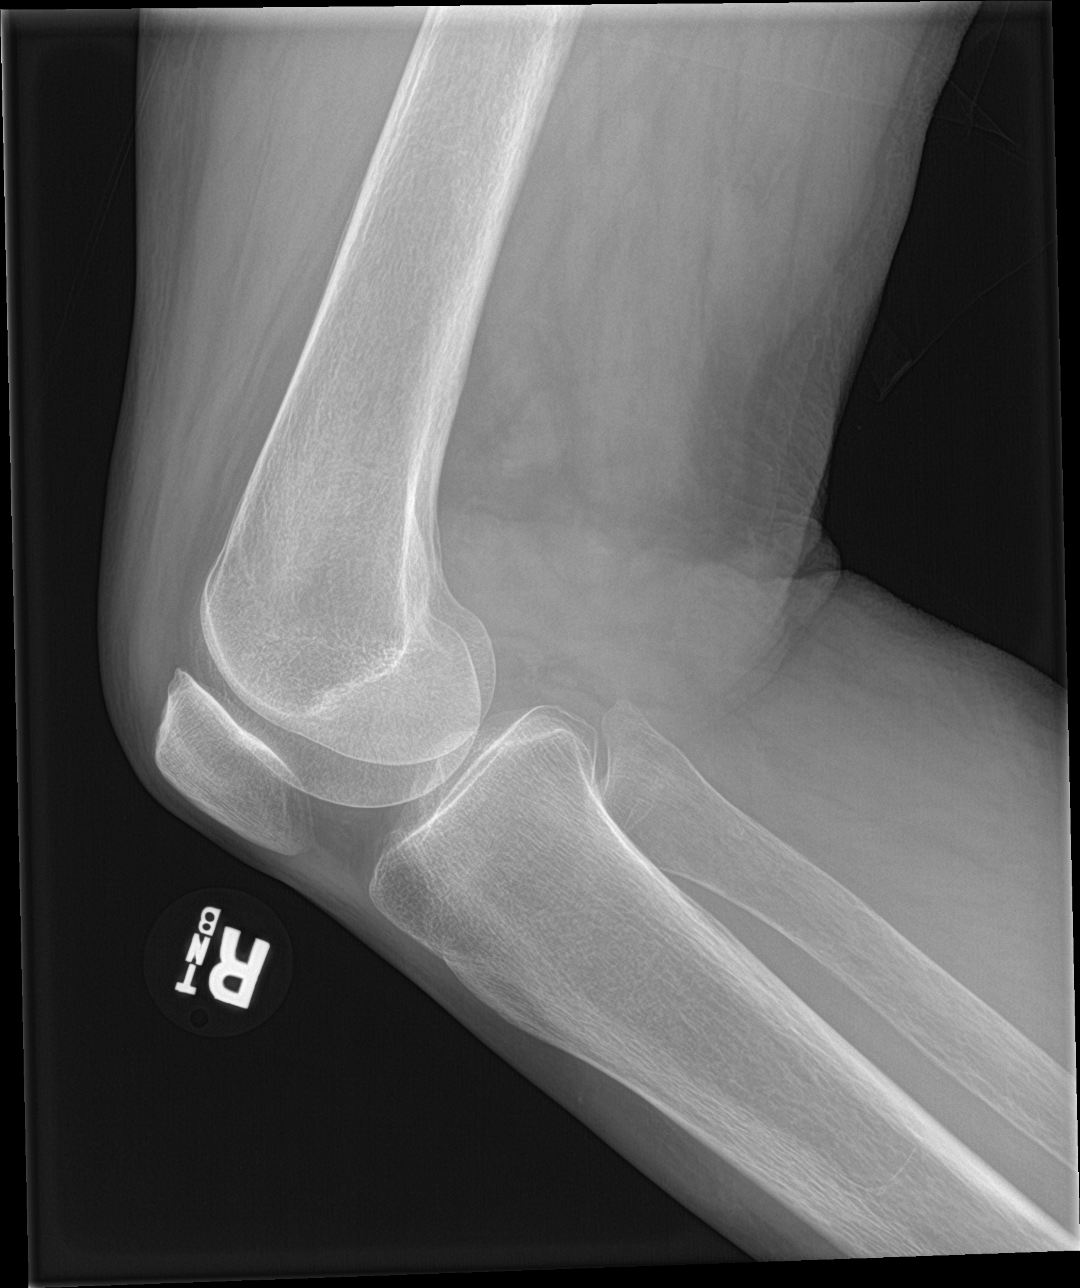

[knee sunrise]
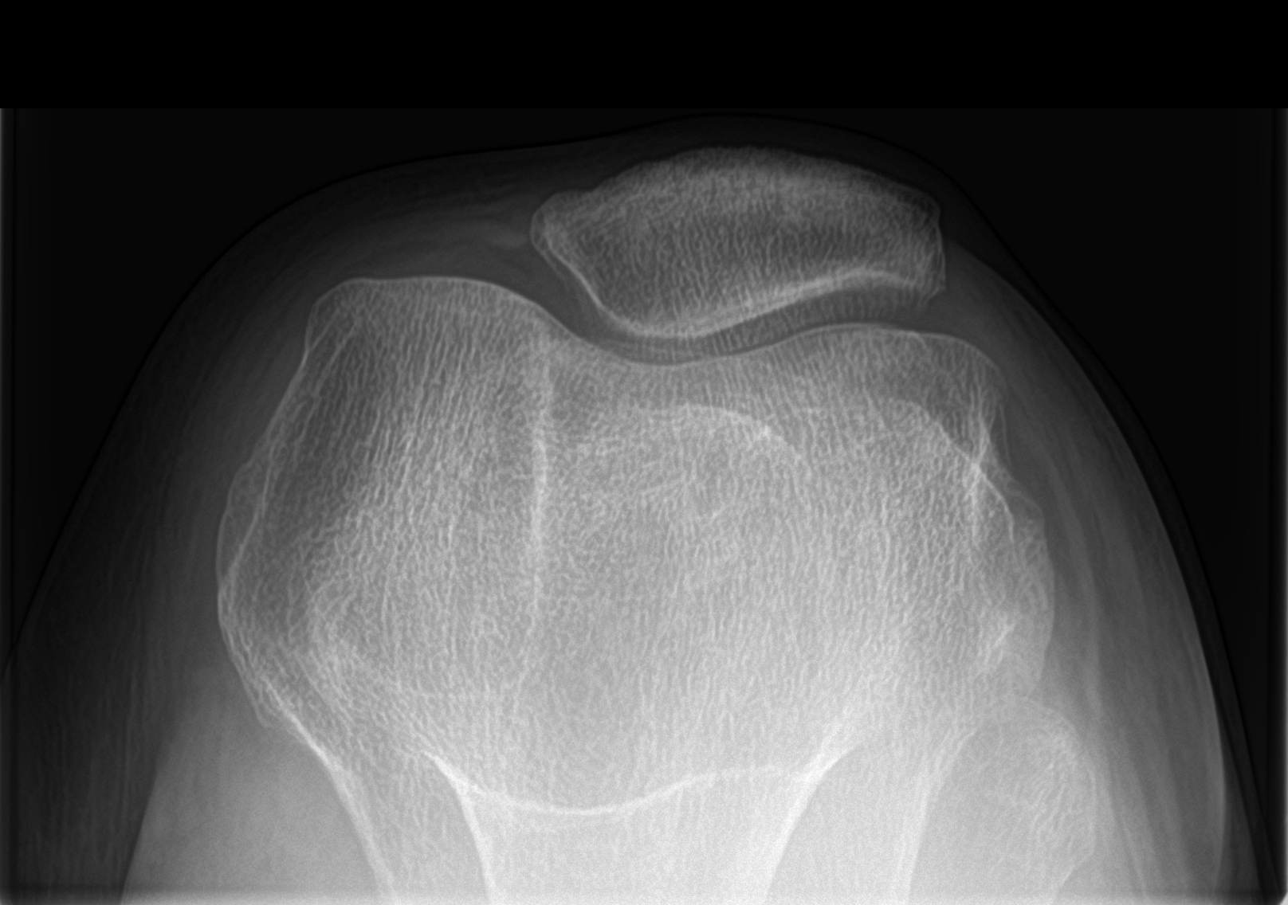

[4 of 4 positions shown; findings below may reference images not displayed]

FINDINGS: No evidence of fracture, dislocation, or joint effusion. No evidence
of arthropathy or other focal bone abnormality. Soft tissues are
unremarkable.
IMPRESSION: Negative.

## 2017-03-28 ENCOUNTER — Inpatient Hospital Stay (HOSPITAL_COMMUNITY): Payer: Medicare Other | Attending: Internal Medicine

## 2017-03-28 ENCOUNTER — Inpatient Hospital Stay (HOSPITAL_COMMUNITY): Payer: Medicare Other | Attending: Internal Medicine | Admitting: Hematology

## 2017-03-28 ENCOUNTER — Encounter (HOSPITAL_COMMUNITY): Payer: Self-pay

## 2017-03-28 ENCOUNTER — Other Ambulatory Visit: Payer: Self-pay

## 2017-03-28 ENCOUNTER — Encounter (HOSPITAL_COMMUNITY): Payer: Self-pay | Admitting: Hematology

## 2017-03-28 ENCOUNTER — Inpatient Hospital Stay (HOSPITAL_COMMUNITY): Payer: Medicare Other

## 2017-03-28 VITALS — BP 155/78 | HR 67 | Temp 97.8°F | Resp 18

## 2017-03-28 DIAGNOSIS — Z9221 Personal history of antineoplastic chemotherapy: Secondary | ICD-10-CM | POA: Diagnosis not present

## 2017-03-28 DIAGNOSIS — D801 Nonfamilial hypogammaglobulinemia: Secondary | ICD-10-CM | POA: Diagnosis not present

## 2017-03-28 DIAGNOSIS — Z85828 Personal history of other malignant neoplasm of skin: Secondary | ICD-10-CM | POA: Diagnosis not present

## 2017-03-28 DIAGNOSIS — Z79899 Other long term (current) drug therapy: Secondary | ICD-10-CM | POA: Diagnosis not present

## 2017-03-28 DIAGNOSIS — Z8572 Personal history of non-Hodgkin lymphomas: Secondary | ICD-10-CM | POA: Diagnosis not present

## 2017-03-28 DIAGNOSIS — C8338 Diffuse large B-cell lymphoma, lymph nodes of multiple sites: Secondary | ICD-10-CM

## 2017-03-28 LAB — COMPREHENSIVE METABOLIC PANEL
ALBUMIN: 3.4 g/dL — AB (ref 3.5–5.0)
ALT: 13 U/L — AB (ref 14–54)
AST: 21 U/L (ref 15–41)
Alkaline Phosphatase: 89 U/L (ref 38–126)
Anion gap: 9 (ref 5–15)
BUN: 11 mg/dL (ref 6–20)
CHLORIDE: 102 mmol/L (ref 101–111)
CO2: 28 mmol/L (ref 22–32)
CREATININE: 0.92 mg/dL (ref 0.44–1.00)
Calcium: 8.8 mg/dL — ABNORMAL LOW (ref 8.9–10.3)
GFR calc Af Amer: >60 mL/min (ref >60)
GLUCOSE: 99 mg/dL (ref 65–99)
POTASSIUM: 3.5 mmol/L (ref 3.5–5.1)
SODIUM: 139 mmol/L (ref 135–145)
Total Bilirubin: 0.5 mg/dL (ref 0.3–1.2)
Total Protein: 6.7 g/dL (ref 6.5–8.1)

## 2017-03-28 LAB — CBC WITH DIFFERENTIAL/PLATELET
BASOS ABS: 0 10*3/uL (ref 0.0–0.1)
BASOS PCT: 0 %
EOS PCT: 4 %
Eosinophils Absolute: 0.2 10*3/uL (ref 0.0–0.7)
HCT: 36.6 % (ref 36.0–46.0)
Hemoglobin: 11.6 g/dL — ABNORMAL LOW (ref 12.0–15.0)
LYMPHS PCT: 23 %
Lymphs Abs: 1.2 10*3/uL (ref 0.7–4.0)
MCH: 29.6 pg (ref 26.0–34.0)
MCHC: 31.7 g/dL (ref 30.0–36.0)
MCV: 93.4 fL (ref 78.0–100.0)
MONO ABS: 0.4 10*3/uL (ref 0.1–1.0)
Monocytes Relative: 8 %
NEUTROS ABS: 3.4 10*3/uL (ref 1.7–7.7)
Neutrophils Relative %: 65 %
PLATELETS: 136 10*3/uL — AB (ref 150–400)
RBC: 3.92 MIL/uL (ref 3.87–5.11)
RDW: 13.4 % (ref 11.5–15.5)
WBC: 5.1 10*3/uL (ref 4.0–10.5)

## 2017-03-28 LAB — LACTATE DEHYDROGENASE: LDH: 161 U/L (ref 98–192)

## 2017-03-28 MED ORDER — ACETAMINOPHEN 325 MG PO TABS: 650.0000 mg | ORAL_TABLET | Freq: Four times a day (QID) | ORAL | Status: DC | PRN

## 2017-03-28 MED ORDER — IMMUNE GLOBULIN (HUMAN) 10 GM/100ML IV SOLN
50.0000 g | Freq: Once | INTRAVENOUS | Status: AC
  Administered 2017-03-28: 50 g via INTRAVENOUS
  Filled 2017-03-28: qty 100

## 2017-03-28 MED ORDER — DIPHENHYDRAMINE HCL 25 MG PO CAPS: 25.0000 mg | ORAL_CAPSULE | Freq: Once | ORAL | Status: DC

## 2017-03-28 MED ORDER — DEXTROSE 5 % IV SOLN
INTRAVENOUS | Status: DC
  Administered 2017-03-28: 09:00:00 via INTRAVENOUS

## 2017-03-28 NOTE — Progress Notes (Signed)
[name] tolerated IVIG infusion well without complaints or incident. VSS prior to,during and after this infusion. Pt discharged self ambulatory in satisfactory condition

## 2017-03-28 NOTE — Patient Instructions (Signed)
[facility] at [facility] Discharge Instructions  Received IVIG infusion today. Follow-up as scheduled. Call clinic for any questions or concerns   Thank you for choosing [facility] at [facility] to provide your oncology and hematology care.  To afford each patient quality time with our provider, please arrive at least 15 minutes before your scheduled appointment time.   If you have a lab appointment with the Cancer Center please come in thru the  Main Entrance and check in at the main information desk  You need to re-schedule your appointment should you arrive 10 or more minutes late.  We strive to give you quality time with our providers, and arriving late affects you and other patients whose appointments are after yours.  Also, if you no show three or more times for appointments you may be dismissed from the clinic at the providers discretion.     Again, thank you for choosing [facility].  Our hope is that these requests will decrease the amount of time that you wait before being seen by our physicians.       _____________________________________________________________  Should you have questions after your visit to [facility], please contact our office at [phone] between the hours of 8:30 a.m. and 4:30 p.m.  Voicemails left after 4:30 p.m. will not be returned until the following business day.  For prescription refill requests, have your pharmacy contact our office.       Resources For Cancer Patients and their Caregivers ? American Cancer Society: Can assist with transportation, wigs, general needs, runs Look Good Feel Better.        1-888-227-6333 ? Cancer Care: Provides financial assistance, online support groups, medication/co-pay assistance.  1-800-813-HOPE (4673) ? [facility] Assists [place] cancer patients and their families through emotional , educational and  financial support.  336-427-4357 ? [place] DSS Where to apply for food stamps, Medicaid and utility assistance. 336-342-1394 ? [facility]: Transportation to medical appointments. 336-347-2287 ? Social Security Administration: May apply for disability if have a Stage IV cancer. 336-342-7796 1-800-772-1213 ? [place] Aging, Disability and Transit Services: Assists with nutrition, care and transit needs. 336-349-2343  Cancer Center Support Programs:   > Cancer Support Group  2nd Tuesday of the month 1pm-2pm, Journey Room   > Creative Journey  3rd Tuesday of the month 1130am-1pm, Journey Room    

## 2017-03-28 NOTE — Assessment & Plan Note (Signed)
1.  Acquired hypogammaglobulinemia: Her last IgG trough levels were 1100.  In the last 6 months, she reported some sinus infections which were minor.  No hospitalizations were reported.  2 months ago she was treated with Levaquin for lower respiratory infection.  She was wondering if she could ever come off of IVIG.  Since her last trough levels were good, I have recommended spreading out the interval between each infusion to 6 weeks.  We will repeat a trough immunoglobulin levels prior to her next infusion.  She is agreeable to this plan.  2.  Diffuse large B-cell lymphoma, stage IVb: She was treated with 4 cycles of R CHOP with intrathecal methotrexate during cycle 3 on 4.  She had developed Pseudomonas sepsis after cycles 2 and 4 and therapy was truncated after 4 cycles.  She also received 2 years of maintenance rituximab.  Today I have discussed the findings on the PET/CT scan with the patient in detail.  There is no evidence of recurrence of lymphoma.  3.  Left lung nodule: There is no evidence of left lung nodule on the PET/CT scan from March 2019.

## 2017-03-28 NOTE — Progress Notes (Signed)
[place], [place] [address]   CLINIC:  Medical Oncology/Hematology  PCP:  [name], MD [phone] [address] [place] [address] [phone]   REASON FOR VISIT:  Follow-up for hypogammaglobulinemia and non-Hodgkin's lymphoma.  CURRENT THERAPY: Monthly infusions of IVIG.  BRIEF ONCOLOGIC HISTORY:    Non Hodgkin's lymphoma ([place])   09/09/2009 Initial Diagnosis    Non Hodgkin's lymphoma ([place])        CANCER STAGING: Cancer Staging Non Hodgkin's lymphoma ([place]) Staging form: Lymphoid Neoplasms, AJCC 6th Edition - Clinical: Stage IV - Signed by Baird Cancer, PA on 11/05/2010    INTERVAL HISTORY:  [name] 62 y.o. female returns for follow-up of lymphoma and maintenance IVIG.  She denies any fevers, night sweats or weight loss.  She reported minor sinus infections in the last 6 months.  No hospitalizations were reported.  2 months ago she was treated with Levaquin for lower respiratory infection.  She is continuing B12 tablet daily.  She has some tingling in the hands which is stable.  She is able to do all her activities without any problems.     REVIEW OF SYSTEMS:  Review of Systems  Constitutional: Negative.   HENT:  Negative.   Respiratory: Negative.   Cardiovascular: Negative.   Gastrointestinal: Negative.   Genitourinary: Negative.    Musculoskeletal: Negative.   Neurological: Positive for numbness.  Psychiatric/Behavioral: Negative.      PAST MEDICAL/SURGICAL HISTORY:  Past Medical History:  Diagnosis Date  . Allergic rhinitis   . Allergy   . Anemia   . Anxiety   . Asthma   . B12 deficiency 01/18/2015  . Blood transfusion without reported diagnosis   . Cataract   . Cavitary lung disease   . Chronic kidney disease    related to cancer  . Chronic sinusitis   . Clotting disorder ([place])   . COPD (chronic obstructive pulmonary disease) ([place])   . Depression   . DVT (deep venous thrombosis) ([place])  09/25/2014  . Endotracheally intubated   . GERD (gastroesophageal reflux disease)   . Hematuria 04/07/2016  . Hyperlipidemia   . Hypertension   . Hypogammaglobulinemia, acquired ([place]) 12/30/2009   Qualifier: Diagnosis of  By: Joya Gaskins MD, Burnett Harry   . Iron deficiency anemia 01/18/2015  . Myocardial infarction ([place]) 2011  . 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 Non Hodgkin's lymphoma ([place]) 2011  . Orofacial dyskinesia 04/22/2015  . Orofacial dyskinesia 04/22/2015  . Osteoporosis   . Peripheral neuropathy due to chemotherapy ([place]) 05/02/2016  . Pneumonia 01/2013  . PONV (postoperative nausea and vomiting)   . Respiratory failure ([place])   . Sleep apnea   . SVC syndrome 09/25/2014   Past Surgical History:  Procedure Laterality Date  . ABDOMINAL HYSTERECTOMY     Fibroids  . BASAL CELL CARCINOMA EXCISION  03/2011   scalp  . BREAST SURGERY Right    biopsy  . CATARACT EXTRACTION W/PHACO Left 11/17/2014   Procedure: CATARACT EXTRACTION PHACO AND INTRAOCULAR LENS PLACEMENT LEFT EYE;  Surgeon: Tonny Branch, MD;  Location: [facility];  Service: Ophthalmology;  Laterality: Left;  CDE:5.60  . CATARACT EXTRACTION W/PHACO Right 12/15/2014   Procedure: CATARACT EXTRACTION PHACO AND INTRAOCULAR LENS PLACEMENT RIGHT EYE CDE=5.16;  Surgeon: Tonny Branch, MD;  Location: [facility];  Service: Ophthalmology;  Laterality: Right;  . COLONOSCOPY N/A 11/14/2012   Procedure: COLONOSCOPY;  Surgeon: Rogene Houston, MD;  Location: [facility];  Service: Endoscopy;  Laterality: N/A;  830  . EYE SURGERY    . NASAL SINUS SURGERY    . NECK SURGERY     x 2   . PERIPHERALLY INSERTED CENTRAL CATHETER INSERTION    . PICC Removal    . PORT-A-CATH REMOVAL    . PORTACATH PLACEMENT  7/11   Removed 6/12  . TRACHEOSTOMY    . VESICOVAGINAL FISTULA CLOSURE W/ TAH       SOCIAL HISTORY:  Social History   Socioeconomic History  . Marital status: Divorced    Spouse name: Not on file  . Number of children: 3  . Years  of education: 69  . Highest education level: Not on file  Occupational History  . Occupation: Estate agent    Comment: [employer]  . Financial resource strain: Not hard at all  . Food insecurity:    Worry: Never true    Inability: Never true  . Transportation needs:    Medical: No    Non-medical: No  Tobacco Use  . Smoking status: Never Smoker  . Smokeless tobacco: Never Used  Substance and Sexual Activity  . Alcohol use: No  . Drug use: No  . Sexual activity: Never    Birth control/protection: Surgical    Comment: hyst  Lifestyle  . Physical activity:    Days per week: 0 days    Minutes per session: 0 min  . Stress: Not at all  Relationships  . Social connections:    Talks on phone: More than three times a week    Gets together: More than three times a week    Attends religious service: More than 4 times per year    Active member of club or organization: Yes    Attends meetings of clubs or organizations: More than 4 times per year    Relationship status: Divorced  . Intimate partner violence:    Fear of current or ex partner: No    Emotionally abused: No    Physically abused: No    Forced sexual activity: No  Other Topics Concern  . Not on file  Social History Narrative   Originally from [place]. Always lived in [place]. Prior travel to [place]. 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 She has 1 dog currently. She has 1 conures (small parrots). No mold exposure in her home. 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 No hot tub exposure. She enjoys sewing & quilting.     FAMILY HISTORY:  Family History  Problem Relation Age of Onset  . Emphysema Mother   . Stroke Mother   . COPD Mother   . Heart disease Mother        died in sleep  18  . Allergies Father   . Asthma Father        as a child  . Arthritis Father   . Parkinson's disease Father 32  . Leukemia Maternal Grandmother   . Cancer  Maternal Grandmother        Leukemia  . Diabetes Brother   . Heart attack Brother   . Hypertension Brother   . Heart disease Brother 40       stents  . Hypertension Sister   . Hypertension Brother     CURRENT MEDICATIONS:  Outpatient Encounter Medications as of 03/28/2017  Medication Sig Note  . acetaminophen (TYLENOL) 500 MG tablet Take  1,000 mg by mouth every 6 (six) hours as needed for pain or fever.    [name] Kitchen acyclovir (ZOVIRAX) 400 MG tablet TAKE ONE TABLET BY MOUTH TWICE DAILY.   [name] Kitchen albuterol (PROAIR HFA) 108 (90 Base) MCG/ACT inhaler Inhale 2 puffs into the lungs every 6 (six) hours as needed.   [name] Kitchen albuterol (PROVENTIL) (2.5 MG/3ML) 0.083% nebulizer solution Take 2.5 mg by nebulization every 4 (four) hours as needed for wheezing or shortness of breath.   [name] Kitchen alendronate (FOSAMAX) 70 MG tablet Take 1 tablet (70 mg total) by mouth once a week. Take with a full glass of water on an empty stomach.   [name] Kitchen amphetamine-dextroamphetamine (ADDERALL) 10 MG tablet Take 10 mg by mouth daily. 04/22/2015: Received from: External Pharmacy Received Sig:   . Armodafinil (NUVIGIL) 150 MG tablet Take 150 mg by mouth daily.   . budesonide-formoterol (SYMBICORT) 160-4.5 MCG/ACT inhaler Inhale 2 puffs into the lungs 2 (two) times daily.   . citalopram (CELEXA) 20 MG tablet Take 20 mg by mouth every morning.     . cyanocobalamin 1000 MCG tablet Take 1,000 mcg by mouth daily.   . fluticasone (FLONASE) 50 MCG/ACT nasal spray    . furosemide (LASIX) 20 MG tablet Take 1 tablet (20 mg total) by mouth as needed for fluid.   [name] Kitchen gabapentin (NEURONTIN) 600 MG tablet    . omeprazole (PRILOSEC) 20 MG capsule TAKE ONE CAPSULE BY MOUTH TWICE DAILY BEFORE A MEAL.   [name] Kitchen Potassium Chloride 40 MEQ/15ML (20%) SOLN Take 20 mEq by mouth 2 (two) times daily. 20 mEq equals 7.5 mL   . predniSONE (DELTASONE) 20 MG tablet Take 1 tablet (20 mg total) by mouth 2 (two) times daily with a meal.   . predniSONE (DELTASONE) 5 MG tablet    .  simvastatin (ZOCOR) 80 MG tablet Take 1 tablet (80 mg total) by mouth at bedtime.   [name] Kitchen tiotropium (SPIRIVA) 18 MCG inhalation capsule Place 1 capsule (18 mcg total) into inhaler and inhale daily.   . zafirlukast (ACCOLATE) 20 MG tablet Take 1 tablet (20 mg total) by mouth 2 (two) times daily.    Facility-Administered Encounter Medications as of 03/28/2017  Medication  . 0.9 %  sodium chloride infusion  . 0.9 %  sodium chloride infusion  . acetaminophen (TYLENOL) tablet 650 mg  . acetaminophen (TYLENOL) tablet 650 mg  . dextrose 5 % solution  . dextrose 5 % solution  . dextrose 5 % solution  . diphenhydrAMINE (BENADRYL) capsule 25 mg  . diphenhydrAMINE (BENADRYL) capsule 25 mg  . sodium chloride 0.9 % injection 10 mL    ALLERGIES:  Allergies  Allergen Reactions  . Meperidine Hcl Anaphylaxis  . Montelukast Sodium Hives and Rash     PHYSICAL EXAM:  ECOG Performance status: 1  Vitals:   03/28/17 0915  BP: (!) 150/60  Pulse: 66  Resp: 16  Temp: (!) 97.5 F (36.4 C)  SpO2: 100%   Filed Weights   03/28/17 0915  Weight: 151 lb 9.6 oz (68.8 kg)    Physical Exam  Constitutional: She is well-developed, well-nourished, and in no distress.  Cardiovascular: Normal rate and regular rhythm.  Pulmonary/Chest: Effort normal and breath sounds normal.  Psychiatric: Mood, memory, affect and judgment normal.     LABORATORY DATA:  I have reviewed the labs as listed.  CBC    Component Value Date/Time   WBC 5.1 03/28/2017 0858   RBC 3.92 03/28/2017 0858   HGB 11.6 (L) 03/28/2017 1517  HCT 36.6 03/28/2017 0858   PLT 136 (L) 03/28/2017 0858   MCV 93.4 03/28/2017 0858   MCH 29.6 03/28/2017 0858   MCHC 31.7 03/28/2017 0858   RDW 13.4 03/28/2017 0858   LYMPHSABS 1.2 03/28/2017 0858   MONOABS 0.4 03/28/2017 0858   EOSABS 0.2 03/28/2017 0858   BASOSABS 0.0 03/28/2017 0858   CMP Latest Ref Rng & Units 03/28/2017 02/28/2017 01/23/2017  Glucose 65 - 99 mg/dL 99 121(H) 79  BUN 6 - 20  mg/dL 11 17 16   Creatinine 0.44 - 1.00 mg/dL 0.92 1.00 0.95  Sodium 135 - 145 mmol/L 139 135 138  Potassium 3.5 - 5.1 mmol/L 3.5 3.6 3.5  Chloride 101 - 111 mmol/L 102 97(L) 98(L)  CO2 22 - 32 mmol/L 28 28 31   Calcium 8.9 - 10.3 mg/dL 8.8(L) 8.9 8.8(L)  Total Protein 6.5 - 8.1 g/dL 6.7 7.1 7.2  Total Bilirubin 0.3 - 1.2 mg/dL 0.5 0.6 0.6  Alkaline Phos 38 - 126 U/L 89 97 91  AST 15 - 41 U/L 21 18 16   ALT 14 - 54 U/L 13(L) 10(L) 12(L)       DIAGNOSTIC IMAGING:  I have reviewed her PET scan dated 03/20/2017 which was within normal limits.  Agree with radiology report.     ASSESSMENT & PLAN:   Non Hodgkin's lymphoma ([facility]) 1.  Acquired hypogammaglobulinemia: Her last IgG trough levels were 1100.  In the last 6 months, she reported some sinus infections which were minor.  No hospitalizations were reported.  2 months ago she was treated with Levaquin for lower respiratory infection.  She was wondering if she could ever come off of IVIG.  Since her last trough levels were good, I have recommended spreading out the interval between each infusion to 6 weeks.  We will repeat a trough immunoglobulin levels prior to her next infusion.  She is agreeable to this plan.  2.  Diffuse large B-cell lymphoma, stage IVb: She was treated with 4 cycles of R CHOP with intrathecal methotrexate during cycle 3 on 4.  She had developed Pseudomonas sepsis after cycles 2 and 4 and therapy was truncated after 4 cycles.  She also received 2 years of maintenance rituximab.  Today I have discussed the findings on the PET/CT scan with the patient in detail.  There is no evidence of recurrence of lymphoma.  3.  Left lung nodule: There is no evidence of left lung nodule on the PET/CT scan from March 2019.      Orders placed this encounter:  No orders of the defined types were placed in this encounter.     Derek Jack, MD [place] [phone]

## 2017-03-29 ENCOUNTER — Telehealth: Payer: Self-pay | Admitting: Adult Health

## 2017-03-29 LAB — IGG, IGA, IGM
IGA: 6 mg/dL — AB (ref 87–352)
IgG (Immunoglobin G), Serum: 1154 mg/dL (ref 700–1600)
IgM (Immunoglobulin M), Srm: 70 mg/dL (ref 26–217)

## 2017-03-29 MED ORDER — PREDNISONE 5 MG PO TABS: 5.0000 mg | ORAL_TABLET | Freq: Every day | ORAL | 5 refills | Status: DC

## 2017-03-29 NOTE — Telephone Encounter (Signed)
Spoke with patient. She was requesting a RX refill on her prednisone 5mg  to be sent to [facility] in [place]. Advised patient that I would send in this medication now for her. She verbalized understanding. Nothing else needed at time of call.

## 2017-04-03 ENCOUNTER — Other Ambulatory Visit: Payer: Self-pay

## 2017-04-03 ENCOUNTER — Ambulatory Visit: Payer: Medicare Other | Admitting: Family Medicine

## 2017-04-03 ENCOUNTER — Encounter: Payer: Self-pay | Admitting: Family Medicine

## 2017-04-03 VITALS — BP 122/70 | HR 69 | Temp 98.2°F | Resp 12 | Ht 62.0 in | Wt 151.1 lb

## 2017-04-03 DIAGNOSIS — R7303 Prediabetes: Secondary | ICD-10-CM | POA: Diagnosis not present

## 2017-04-03 DIAGNOSIS — G620 Drug-induced polyneuropathy: Secondary | ICD-10-CM

## 2017-04-03 DIAGNOSIS — T451X5A Adverse effect of antineoplastic and immunosuppressive drugs, initial encounter: Secondary | ICD-10-CM

## 2017-04-03 DIAGNOSIS — I10 Essential (primary) hypertension: Secondary | ICD-10-CM | POA: Diagnosis not present

## 2017-04-03 DIAGNOSIS — E785 Hyperlipidemia, unspecified: Secondary | ICD-10-CM | POA: Diagnosis not present

## 2017-04-03 NOTE — Progress Notes (Signed)
Chief Complaint  Patient presents with  . 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 Her blood pressure is well controlled. She has prediabetes, controlled with diet.  Her last hemoglobin A1c is 5.7. She has GERD controlled with Protonix. She has a history of hyperlipidemia and takes pantoprazole 80 mg a day.  Her last LDL was 67 Chemotherapy for her lymphoma left her with neuropathy.  She takes gabapentin with good results. She has significant asthma with frequent flares.  Currently she is feeling well.  She is compliant with her inhalers.  Infrequently needs rescue inhaler.  Patient Active Problem List   Diagnosis Date Noted  . Peripheral neuropathy due to chemotherapy ([place]) 05/02/2016  . Recurrent herpes labialis 05/02/2016  . Osteoporosis 04/30/2015  . 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 Iron deficiency anemia 01/18/2015  . B12 deficiency 01/18/2015  . SVC syndrome 09/25/2014  . Sinusitis, chronic 05/06/2014  . Abnormal finding on imaging 02/11/2014  . OSA (obstructive sleep apnea) 09/10/2012  . 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 Hypogammaglobulinemia, acquired ([place]) 12/30/2009  . Non Hodgkin's lymphoma ([place]) 09/09/2009  . HLD (hyperlipidemia) 04/21/2007  . Essential hypertension 04/21/2007  . DIABETES MELLITUS, BORDERLINE 04/21/2007  . Severe persistent asthma with acute exacerbation 04/03/2007  . Allergic rhinitis 10/19/2006  . VOCAL CORD DISORDER 10/19/2006  . GERD 10/19/2006  . GASTRIC POLYP 07/25/2006    Outpatient Encounter Medications as of 04/03/2017  Medication Sig  . acetaminophen (TYLENOL) 500 MG tablet Take 1,000 mg by mouth every 6 (six) hours as needed for pain or fever.   [name] Kitchen acyclovir (ZOVIRAX) 400 MG tablet TAKE ONE TABLET BY MOUTH TWICE DAILY.  [name] Kitchen albuterol (PROAIR HFA) 108 (90 Base) MCG/ACT  inhaler Inhale 2 puffs into the lungs every 6 (six) hours as needed.  [name] Kitchen albuterol (PROVENTIL) (2.5 MG/3ML) 0.083% nebulizer solution Take 2.5 mg by nebulization every 4 (four) hours as needed for wheezing or shortness of breath.  [name] Kitchen alendronate (FOSAMAX) 70 MG tablet Take 1 tablet (70 mg total) by mouth once a week. Take with a full glass of water on an empty stomach.  [name] Kitchen amphetamine-dextroamphetamine (ADDERALL) 10 MG tablet Take 10 mg by mouth daily.  . Armodafinil (NUVIGIL) 150 MG tablet Take 150 mg by mouth daily.  . budesonide-formoterol (SYMBICORT) 160-4.5 MCG/ACT inhaler Inhale 2 puffs into the lungs 2 (two) times daily.  . citalopram (CELEXA) 20 MG tablet Take 20 mg by mouth every morning.    . cyanocobalamin 1000 MCG tablet Take 1,000 mcg by mouth daily.  . fluticasone (FLONASE) 50 MCG/ACT nasal spray Place 2 sprays into both nostrils daily.   . furosemide (LASIX) 20 MG tablet Take 1 tablet (20 mg total) by mouth as needed for fluid.  [name] Kitchen gabapentin (NEURONTIN) 600 MG tablet Take 300 mg by mouth 3 (three) times daily.   [name] Kitchen omeprazole (PRILOSEC) 20 MG capsule TAKE ONE CAPSULE BY MOUTH TWICE DAILY BEFORE A MEAL.  [name] Kitchen Potassium Chloride 40 MEQ/15ML (20%) SOLN Take 20 mEq by mouth 2 (two) times daily. 20 mEq equals 7.5 mL  . predniSONE (DELTASONE) 5 MG tablet Take 1 tablet (5 mg total) by mouth daily with breakfast.  . simvastatin (ZOCOR) 80 MG tablet Take 1 tablet (80 mg total) by mouth at bedtime.  [name] Kitchen tiotropium (SPIRIVA) 18 MCG inhalation capsule Place 1 capsule (18 mcg total) into  inhaler and inhale daily.  . zafirlukast (ACCOLATE) 20 MG tablet Take 1 tablet (20 mg total) by mouth 2 (two) times daily.  . predniSONE (DELTASONE) 20 MG tablet Take 1 tablet (20 mg total) by mouth 2 (two) times daily with a meal. (Patient not taking: Reported on 04/03/2017)   Facility-Administered Encounter Medications as of 04/03/2017  Medication  . 0.9 %  sodium chloride infusion  . 0.9 %  sodium chloride infusion  .  acetaminophen (TYLENOL) tablet 650 mg  . acetaminophen (TYLENOL) tablet 650 mg  . dextrose 5 % solution  . dextrose 5 % solution  . dextrose 5 % solution  . diphenhydrAMINE (BENADRYL) capsule 25 mg  . diphenhydrAMINE (BENADRYL) capsule 25 mg  . sodium chloride 0.9 % injection 10 mL    Allergies  Allergen Reactions  . Meperidine Hcl Anaphylaxis  . Montelukast Sodium Hives and Rash    Review of Systems  Constitutional: Positive for fatigue. Negative for activity change, appetite change, chills, fever and unexpected weight change.  HENT: Negative for congestion, dental problem, postnasal drip, rhinorrhea, sinus pressure, sinus pain, sore throat and trouble swallowing.   Eyes: Negative for redness and visual disturbance.  Respiratory: Negative for cough, shortness of breath and wheezing.   Cardiovascular: Negative for chest pain, palpitations and leg swelling.  Gastrointestinal: Negative for abdominal pain, constipation, diarrhea and nausea.  Genitourinary: Negative for difficulty urinating and frequency.  Musculoskeletal: Negative for arthralgias and back pain.  Allergic/Immunologic: Positive for environmental allergies and immunocompromised state.       Chronic prednisone use  Neurological: Negative for dizziness and headaches.  Hematological: Bruises/bleeds easily.  Psychiatric/Behavioral: Negative for dysphoric mood and sleep disturbance. The patient is not nervous/anxious.       BP 122/70   Pulse 69   Temp 98.2 F (36.8 C)   Resp 12   Ht 5\' 2"  (1.575 m)   Wt 151 lb 1.9 oz (68.5 kg)   SpO2 96%   BMI 27.64 kg/m   Physical Exam  Constitutional: She is oriented to person, place, and time. She appears well-developed and well-nourished.  HENT:  Head: Normocephalic and atraumatic.  Mouth/Throat: Oropharynx is clear and moist.  Eyes: Pupils are equal, round, and reactive to light. Conjunctivae are normal.  Neck: Normal range of motion. Neck supple. No thyromegaly present.    Cardiovascular: Normal rate, regular rhythm and normal heart sounds.  Pulmonary/Chest: Effort normal. No respiratory distress. She has no wheezes.  Faint crackles in bases  Abdominal: Soft. Bowel sounds are normal.  Musculoskeletal: Normal range of motion. She exhibits no edema.  Lymphadenopathy:    She has no cervical adenopathy.  Neurological: She is alert and oriented to person, place, and time.  Gait normal  Skin: Skin is warm and dry.  Skin thin, fragile, disoclored  Psychiatric: She has a normal mood and affect. Her behavior is normal. Thought content normal.  Nursing note and vitals reviewed.   ASSESSMENT/PLAN:  1. Essential hypertension Controlled  2. Prediabetes Controlled with diet  3. Peripheral neuropathy due to chemotherapy ([place]) On gabapentin  4. Hyperlipidemia, unspecified hyperlipidemia type On Zocor   Patient Instructions  You are doing well  Call your insurance about the shingles shot  Exercise every day that you are able  You need folow up in six months  You will see Dr Mannie Stabile next time   [name], MD

## 2017-04-03 NOTE — Patient Instructions (Signed)
You are doing well  Call your insurance about the shingles shot  Exercise every day that you are able  You need folow up in six months  You will see Dr Mannie Stabile next time

## 2017-04-16 IMAGING — DX DG CHEST 2V
2 series · 2 of 2 positions shown · non-contrast
Comparison: Chest radiograph 11/13/14 and chest CT 01/16/2014

CLINICAL DATA: Cough fever shortness of breath for 4 days.  Asthma.

EXAM:
CHEST  2 VIEW

[chest pa]
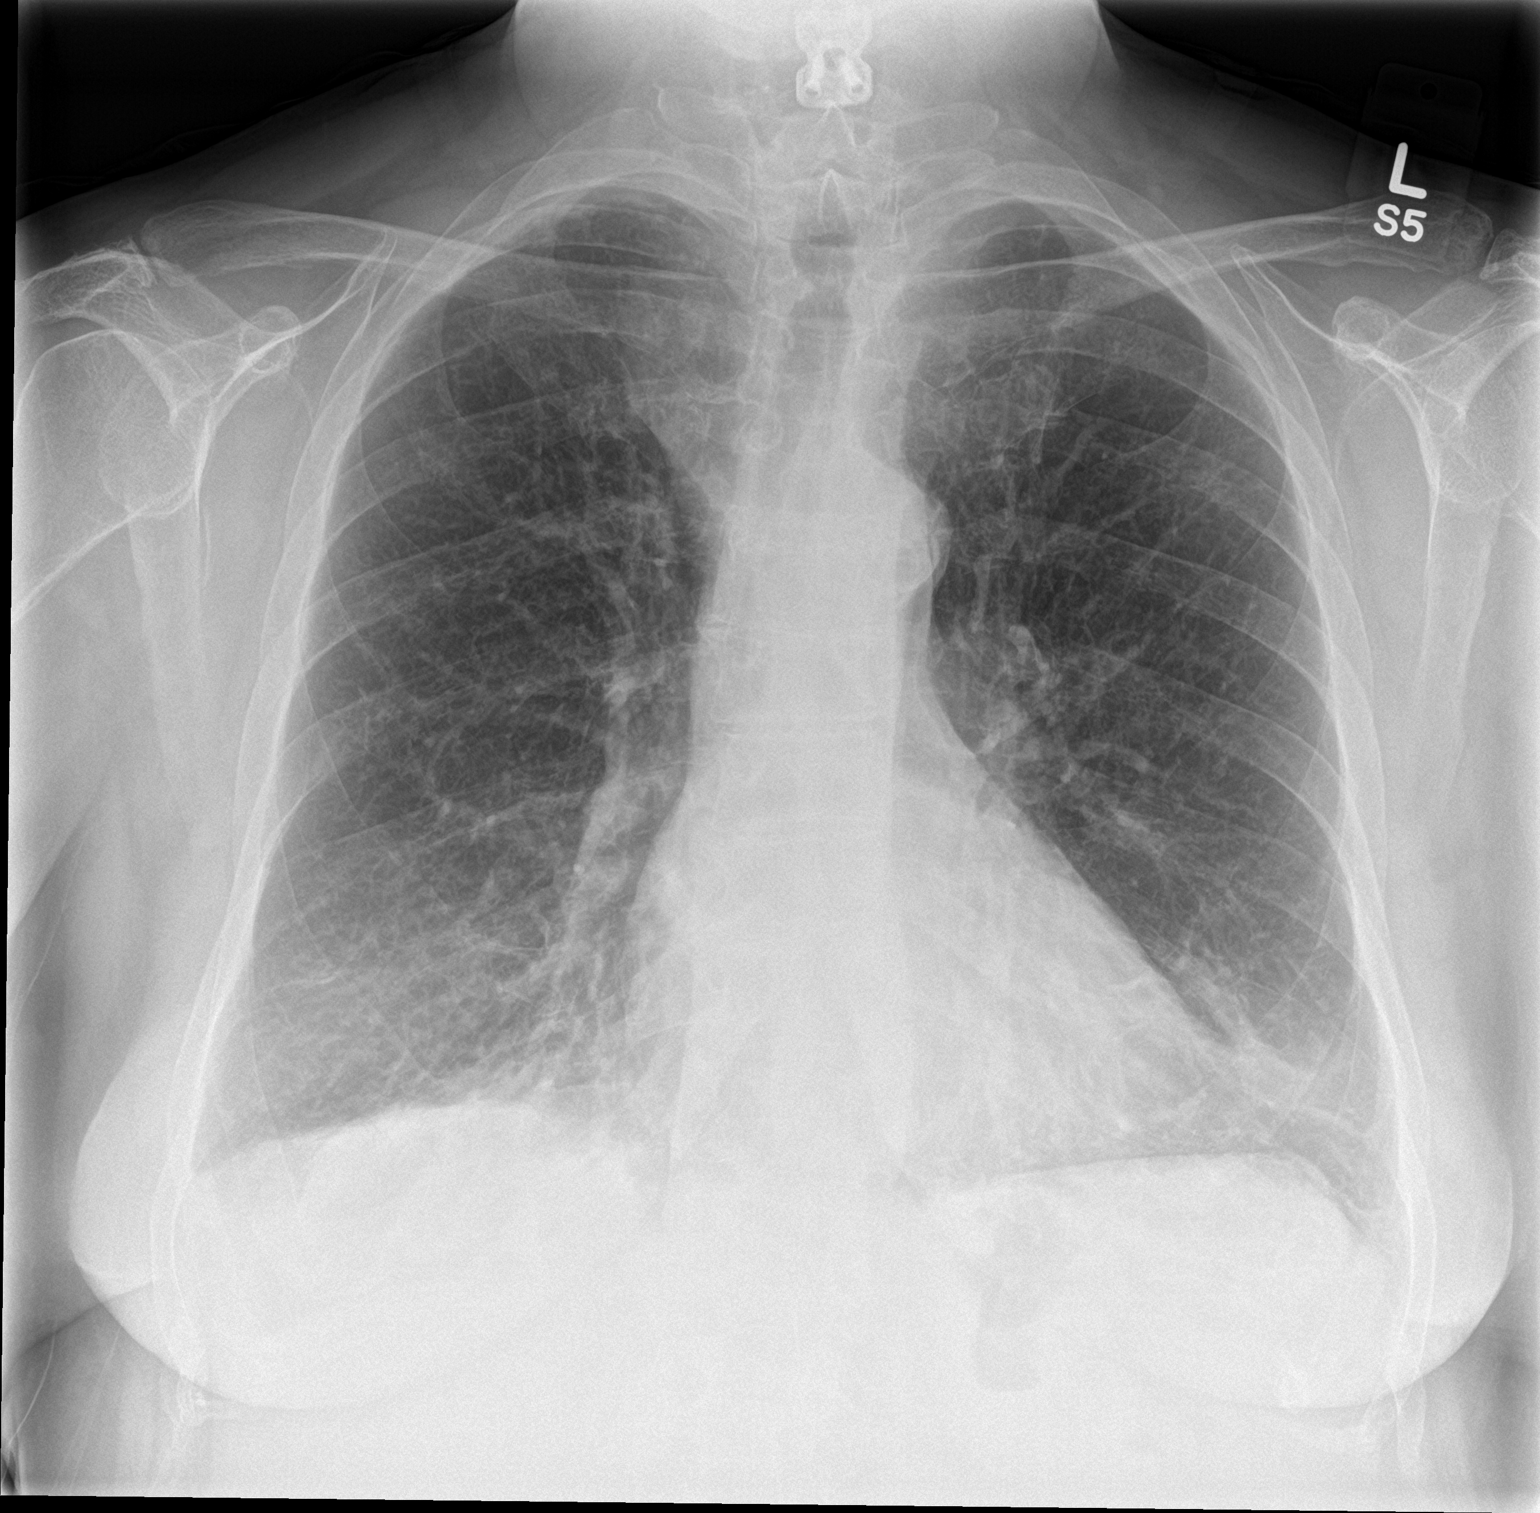

[chest lat]
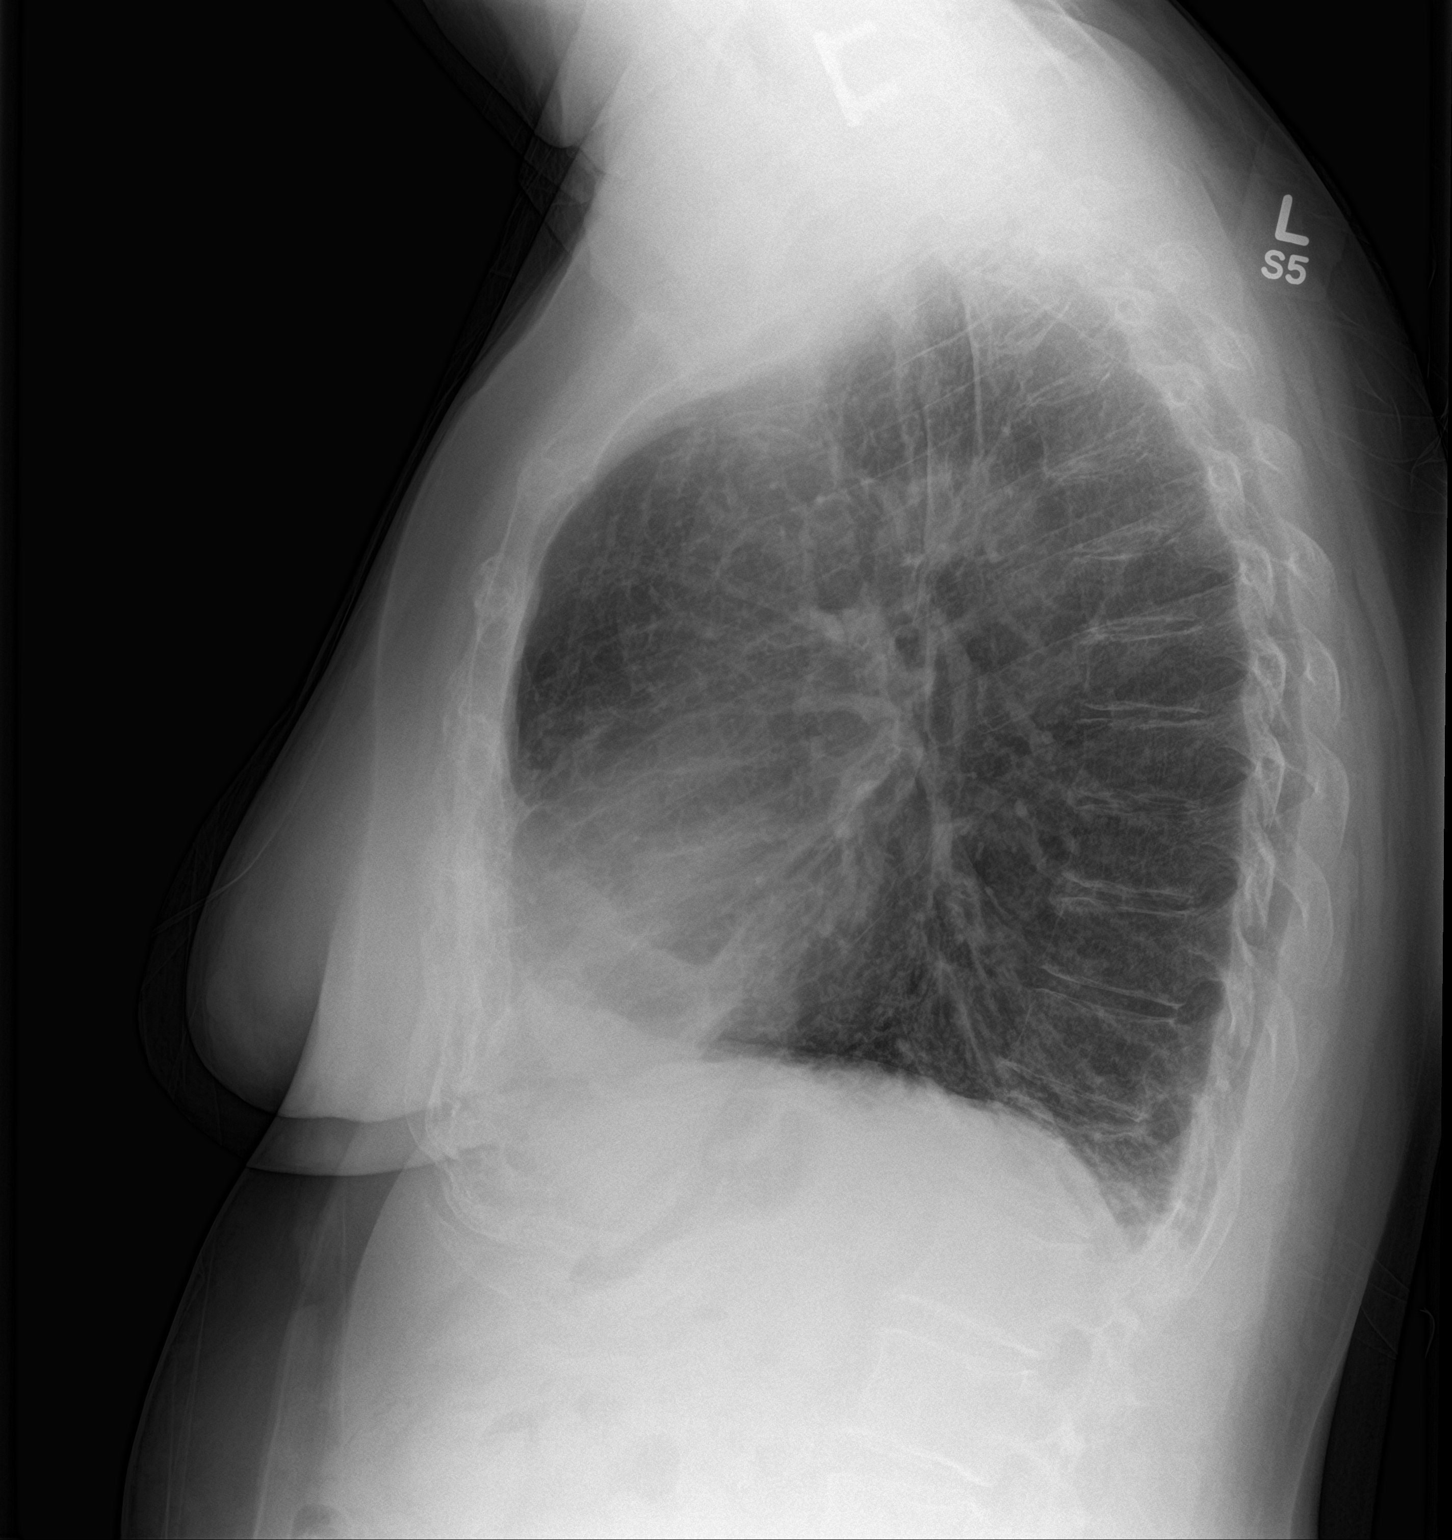

[2 of 2 positions shown; findings below may reference images not displayed]

FINDINGS: The lungs are well inflated. There is atherosclerotic calcification
within the aortic arch. Cardiomediastinal contours otherwise normal.
Revision

No pneumothorax or sizable pleural effusion. No focal airspace
consolidation. Prominent bilateral lower lobe interstitial markings
are unchanged.
IMPRESSION: Unchanged lower lobe pleural parenchymal scarring and
bronchiectasis. No acute cardiopulmonary disease.

## 2017-04-28 ENCOUNTER — Other Ambulatory Visit (HOSPITAL_COMMUNITY): Payer: Medicare Other

## 2017-04-28 ENCOUNTER — Ambulatory Visit (HOSPITAL_COMMUNITY): Payer: Medicare Other

## 2017-04-28 ENCOUNTER — Ambulatory Visit (HOSPITAL_COMMUNITY): Payer: Medicare Other | Admitting: Hematology

## 2017-05-05 ENCOUNTER — Other Ambulatory Visit: Payer: Self-pay | Admitting: Family Medicine

## 2017-05-15 ENCOUNTER — Other Ambulatory Visit (HOSPITAL_COMMUNITY): Payer: Self-pay | Admitting: *Deleted

## 2017-05-15 DIAGNOSIS — C8330 Diffuse large B-cell lymphoma, unspecified site: Secondary | ICD-10-CM

## 2017-05-16 ENCOUNTER — Inpatient Hospital Stay (HOSPITAL_BASED_OUTPATIENT_CLINIC_OR_DEPARTMENT_OTHER): Payer: Medicare Other | Admitting: Adult Health

## 2017-05-16 ENCOUNTER — Inpatient Hospital Stay (HOSPITAL_COMMUNITY): Payer: Medicare Other | Attending: Hematology

## 2017-05-16 ENCOUNTER — Inpatient Hospital Stay (HOSPITAL_COMMUNITY): Payer: Medicare Other

## 2017-05-16 ENCOUNTER — Other Ambulatory Visit: Payer: Self-pay

## 2017-05-16 ENCOUNTER — Encounter (HOSPITAL_COMMUNITY): Payer: Self-pay | Admitting: Adult Health

## 2017-05-16 VITALS — BP 140/81 | HR 65 | Temp 98.2°F | Resp 18 | Wt 149.0 lb

## 2017-05-16 DIAGNOSIS — D72829 Elevated white blood cell count, unspecified: Secondary | ICD-10-CM | POA: Diagnosis not present

## 2017-05-16 DIAGNOSIS — D801 Nonfamilial hypogammaglobulinemia: Secondary | ICD-10-CM | POA: Insufficient documentation

## 2017-05-16 DIAGNOSIS — D649 Anemia, unspecified: Secondary | ICD-10-CM | POA: Insufficient documentation

## 2017-05-16 DIAGNOSIS — C8330 Diffuse large B-cell lymphoma, unspecified site: Secondary | ICD-10-CM

## 2017-05-16 DIAGNOSIS — Z79899 Other long term (current) drug therapy: Secondary | ICD-10-CM | POA: Diagnosis not present

## 2017-05-16 DIAGNOSIS — Z8572 Personal history of non-Hodgkin lymphomas: Secondary | ICD-10-CM

## 2017-05-16 LAB — COMPREHENSIVE METABOLIC PANEL
ALBUMIN: 3.3 g/dL — AB (ref 3.5–5.0)
ALT: 10 U/L — ABNORMAL LOW (ref 14–54)
AST: 15 U/L (ref 15–41)
Alkaline Phosphatase: 99 U/L (ref 38–126)
Anion gap: 9 (ref 5–15)
BILIRUBIN TOTAL: 0.7 mg/dL (ref 0.3–1.2)
BUN: 15 mg/dL (ref 6–20)
CHLORIDE: 98 mmol/L — AB (ref 101–111)
CO2: 32 mmol/L (ref 22–32)
Calcium: 8.7 mg/dL — ABNORMAL LOW (ref 8.9–10.3)
Creatinine, Ser: 0.96 mg/dL (ref 0.44–1.00)
GFR calc Af Amer: >60 mL/min (ref >60)
GFR calc non Af Amer: >60 mL/min (ref >60)
GLUCOSE: 98 mg/dL (ref 65–99)
POTASSIUM: 3.5 mmol/L (ref 3.5–5.1)
Sodium: 139 mmol/L (ref 135–145)
TOTAL PROTEIN: 6.9 g/dL (ref 6.5–8.1)

## 2017-05-16 LAB — CBC WITH DIFFERENTIAL/PLATELET
Basophils Absolute: 0 10*3/uL (ref 0.0–0.1)
Basophils Relative: 0 %
EOS ABS: 0.1 10*3/uL (ref 0.0–0.7)
Eosinophils Relative: 1 %
HCT: 36.5 % (ref 36.0–46.0)
HEMOGLOBIN: 11.4 g/dL — AB (ref 12.0–15.0)
LYMPHS ABS: 0.9 10*3/uL (ref 0.7–4.0)
Lymphocytes Relative: 7 %
MCH: 29.2 pg (ref 26.0–34.0)
MCHC: 31.2 g/dL (ref 30.0–36.0)
MCV: 93.6 fL (ref 78.0–100.0)
MONO ABS: 0.5 10*3/uL (ref 0.1–1.0)
MONOS PCT: 4 %
NEUTROS PCT: 88 %
Neutro Abs: 10.8 10*3/uL — ABNORMAL HIGH (ref 1.7–7.7)
Platelets: 216 10*3/uL (ref 150–400)
RBC: 3.9 MIL/uL (ref 3.87–5.11)
RDW: 13.4 % (ref 11.5–15.5)
WBC: 12.3 10*3/uL — ABNORMAL HIGH (ref 4.0–10.5)

## 2017-05-16 LAB — LACTATE DEHYDROGENASE: LDH: 141 U/L (ref 98–192)

## 2017-05-16 MED ORDER — DEXTROSE 5 % IV SOLN
INTRAVENOUS | Status: DC
  Administered 2017-05-16: 09:00:00 via INTRAVENOUS

## 2017-05-16 MED ORDER — ACETAMINOPHEN 325 MG PO TABS: 650.0000 mg | ORAL_TABLET | Freq: Four times a day (QID) | ORAL | Status: DC | PRN

## 2017-05-16 MED ORDER — IMMUNE GLOBULIN (HUMAN) 10 GM/100ML IV SOLN
50.0000 g | Freq: Once | INTRAVENOUS | Status: AC
  Administered 2017-05-16: 50 g via INTRAVENOUS
  Filled 2017-05-16: qty 100

## 2017-05-16 MED ORDER — DIPHENHYDRAMINE HCL 25 MG PO CAPS
25.0000 mg | ORAL_CAPSULE | Freq: Once | ORAL | Status: DC
  Filled 2017-05-16: qty 1

## 2017-05-16 NOTE — Progress Notes (Signed)
[name],  [id number]   CLINIC:  Medical Oncology/Hematology  PCP:  Caren Macadam, [address] [place] [address] [phone]   REASON FOR VISIT:  Follow-up for Hypogammaglobulinemia AND non-Hodgkin lymphoma   CURRENT THERAPY: IVIG every 6 weeks AND observation   BRIEF ONCOLOGIC HISTORY:    Non Hodgkin's lymphoma ([place])   09/09/2009 Initial Diagnosis    Non Hodgkin's lymphoma ([place])        HISTORY OF PRESENT ILLNESS:  (From Dr. Tomie China note on 03/28/17)      INTERVAL HISTORY:  [name] 62 y.o. female returns for routine follow-up and consideration for next dose of IVIG.   At her last visit at the cancer center, Dr. Delton Coombes lengthened the amount of time between her IVIG infusions to every 6 weeks.  Overall, she tells me she has been feeling very well.  Appetite and energy levels both 75%.  She does report an episode of fever to 100.7 with chills, about 2 weeks ago.  This was treated by her PCP with antibiotics.  She has had no recurrent fevers or chills since that time.  Prior to this recent course of antibiotics, her last dose of antibiotics was 2 to 3 months ago.  Denies any night sweats or unintentional weight loss.  Denies any adenopathy. Denies any N&V or diarrhea.  She shares with me that with her initial presentation with her lymphoma, she had "constant itching."  Denies any recent or current pruritis.   She has chronic issues with dysphagia, particularly with solid foods, shortness of breath which she attributes to her asthma, and easy bruising.   REVIEW OF SYSTEMS:  Review of Systems - Oncology Per HPI. Otherwise 12 point ROS reviewed and negative except as stated above.   PAST MEDICAL/SURGICAL HISTORY:  Past Medical History:  Diagnosis Date  . Allergic rhinitis   . Allergy   . Anemia   . Anxiety   . Asthma   . B12 deficiency 01/18/2015  . Blood transfusion without reported diagnosis   .  Cataract   . Cavitary lung disease   . Chronic kidney disease    related to cancer  . Chronic sinusitis   . Clotting disorder ([place])   . COPD (chronic obstructive pulmonary disease) ([place])   . Depression   . DVT (deep venous thrombosis) ([place]) 09/25/2014  . Endotracheally intubated   . GERD (gastroesophageal reflux disease)   . Hematuria 04/07/2016  . Hyperlipidemia   . Hypertension   . Hypogammaglobulinemia, acquired ([place]) 12/30/2009   Qualifier: Diagnosis of  By: Joya Gaskins MD, Burnett Harry   . Iron deficiency anemia 01/18/2015  . Myocardial infarction ([place]) 2011  . 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 Non Hodgkin's lymphoma ([place]) 2011  . Orofacial dyskinesia 04/22/2015  . Orofacial dyskinesia 04/22/2015  . Osteoporosis   . Peripheral neuropathy due to chemotherapy ([place]) 05/02/2016  . Pneumonia 01/2013  . PONV (postoperative nausea and vomiting)   . Respiratory failure ([place])   . Sleep apnea   . SVC syndrome 09/25/2014   Past Surgical History:  Procedure Laterality Date  . ABDOMINAL HYSTERECTOMY     Fibroids  . BASAL CELL CARCINOMA EXCISION  03/2011   scalp  . BREAST SURGERY Right    biopsy  . CATARACT EXTRACTION W/PHACO Left 11/17/2014   Procedure: CATARACT EXTRACTION PHACO AND INTRAOCULAR LENS PLACEMENT LEFT EYE;  Surgeon: Tonny Branch, MD;  Location: [facility];  Service: Ophthalmology;  Laterality:  Left;  CDE:5.60  . CATARACT EXTRACTION W/PHACO Right 12/15/2014   Procedure: CATARACT EXTRACTION PHACO AND INTRAOCULAR LENS PLACEMENT RIGHT EYE CDE=5.16;  Surgeon: Tonny Branch, MD;  Location: [facility];  Service: Ophthalmology;  Laterality: Right;  . COLONOSCOPY N/A 11/14/2012   Procedure: COLONOSCOPY;  Surgeon: Rogene [place], MD;  Location: [facility] END[facility] SUITE;  Service: Endoscopy;  Laterality: N/A;  830  . EYE SURGERY    . NASAL SINUS SURGERY    . NECK SURGERY     x 2   . PERIPHERALLY INSERTED CENTRAL CATHETER INSERTION    . PICC Removal    . PORT-A-CATH REMOVAL    . PORTACATH  PLACEMENT  7/11   Removed 6/12  . TRACHEOSTOMY    . VESICOVAGINAL FISTULA CLOSURE W/ TAH       SOCIAL HISTORY:  Social History   Socioeconomic History  . Marital status: Divorced    Spouse name: Not on file  . Number of children: 3  . Years of education: 24  . Highest education level: Not on file  Occupational History  . Occupation: Estate agent    Comment: [employer]  . Financial resource strain: Not hard at all  . Food insecurity:    Worry: Never true    Inability: Never true  . Transportation needs:    Medical: No    Non-medical: No  Tobacco Use  . Smoking status: Never Smoker  . Smokeless tobacco: Never Used  Substance and Sexual Activity  . Alcohol use: No  . Drug use: No  . Sexual activity: Never    Birth control/protection: Surgical    Comment: hyst  Lifestyle  . Physical activity:    Days per week: 0 days    Minutes per session: 0 min  . Stress: Not at all  Relationships  . Social connections:    Talks on phone: More than three times a week    Gets together: More than three times a week    Attends religious service: More than 4 times per year    Active member of club or organization: Yes    Attends meetings of clubs or organizations: More than 4 times per year    Relationship status: Divorced  . Intimate partner violence:    Fear of current or ex partner: No    Emotionally abused: No    Physically abused: No    Forced sexual activity: No  Other Topics Concern  . Not on file  Social History Narrative   Originally from [place]. Always lived in [place]. Prior travel to [place]. 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 She has 1 dog currently. She has 1 conures (small parrots). No mold exposure in her home. 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 No hot tub exposure. She enjoys sewing & quilting.     FAMILY HISTORY:  Family History  Problem Relation Age of Onset  . Emphysema  Mother   . Stroke Mother   . COPD Mother   . Heart disease Mother        died in sleep  68  . Allergies Father   . Asthma Father        as a child  . Arthritis Father   . Parkinson's disease Father 82  . Leukemia Maternal Grandmother   . Cancer Maternal Grandmother        Leukemia  . Diabetes Brother   .  Heart attack Brother   . Hypertension Brother   . Heart disease Brother 40       stents  . Hypertension Sister   . Hypertension Brother     CURRENT MEDICATIONS:  Outpatient Encounter Medications as of 05/16/2017  Medication Sig Note  . acetaminophen (TYLENOL) 500 MG tablet Take 1,000 mg by mouth every 6 (six) hours as needed for pain or fever.    [name] Kitchen acyclovir (ZOVIRAX) 400 MG tablet TAKE ONE TABLET BY MOUTH TWICE DAILY.   [name] Kitchen albuterol (PROAIR HFA) 108 (90 Base) MCG/ACT inhaler Inhale 2 puffs into the lungs every 6 (six) hours as needed.   [name] Kitchen albuterol (PROVENTIL) (2.5 MG/3ML) 0.083% nebulizer solution Take 2.5 mg by nebulization every 4 (four) hours as needed for wheezing or shortness of breath.   [name] Kitchen alendronate (FOSAMAX) 70 MG tablet Take 1 tablet (70 mg total) by mouth once a week. Take with a full glass of water on an empty stomach.   [name] Kitchen amphetamine-dextroamphetamine (ADDERALL) 10 MG tablet Take 10 mg by mouth daily. 04/22/2015: Received from: External Pharmacy Received Sig:   . Armodafinil (NUVIGIL) 150 MG tablet Take 150 mg by mouth daily.   . budesonide-formoterol (SYMBICORT) 160-4.5 MCG/ACT inhaler Inhale 2 puffs into the lungs 2 (two) times daily.   . citalopram (CELEXA) 20 MG tablet Take 20 mg by mouth every morning.     . cyanocobalamin 1000 MCG tablet Take 1,000 mcg by mouth daily.   . fluticasone (FLONASE) 50 MCG/ACT nasal spray Place 2 sprays into both nostrils daily.    . furosemide (LASIX) 20 MG tablet Take 1 tablet (20 mg total) by mouth as needed for fluid.   [name] Kitchen gabapentin (NEURONTIN) 600 MG tablet Take 300 mg by mouth 3 (three) times daily.    [name] Kitchen omeprazole (PRILOSEC)  20 MG capsule TAKE ONE CAPSULE BY MOUTH TWICE DAILY BEFORE A MEAL.   [name] Kitchen Potassium Chloride 40 MEQ/15ML (20%) SOLN Take 20 mEq by mouth 2 (two) times daily. 20 mEq equals 7.5 mL   . predniSONE (DELTASONE) 20 MG tablet Take 1 tablet (20 mg total) by mouth 2 (two) times daily with a meal.   . predniSONE (DELTASONE) 5 MG tablet Take 1 tablet (5 mg total) by mouth daily with breakfast.   . simvastatin (ZOCOR) 80 MG tablet Take 1 tablet (80 mg total) by mouth at bedtime.   [name] Kitchen tiotropium (SPIRIVA) 18 MCG inhalation capsule Place 1 capsule (18 mcg total) into inhaler and inhale daily.   . zafirlukast (ACCOLATE) 20 MG tablet Take 1 tablet (20 mg total) by mouth 2 (two) times daily.    Facility-Administered Encounter Medications as of 05/16/2017  Medication  . 0.9 %  sodium chloride infusion  . 0.9 %  sodium chloride infusion  . acetaminophen (TYLENOL) tablet 650 mg  . acetaminophen (TYLENOL) tablet 650 mg  . acetaminophen (TYLENOL) tablet 650 mg  . dextrose 5 % solution  . dextrose 5 % solution  . dextrose 5 % solution  . diphenhydrAMINE (BENADRYL) capsule 25 mg  . diphenhydrAMINE (BENADRYL) capsule 25 mg  . diphenhydrAMINE (BENADRYL) capsule 25 mg  . Immune Globulin 10% (PRIVIGEN) IV infusion 50 g  . sodium chloride 0.9 % injection 10 mL    ALLERGIES:  Allergies  Allergen Reactions  . Meperidine Hcl Anaphylaxis  . Montelukast Sodium Hives and Rash     PHYSICAL EXAM:  ECOG Performance status: 0-1 - Mildly symptomatic; remains independent   BP 144/76 Pulse 74 Respirations 18 Temp 98.4 O2  sat 97%  Weight 149 lbs   Physical Exam  Constitutional: She is oriented to person, place, and time.  Seen in chemo chair in infusion area   HENT:  Head: Normocephalic.  Mouth/Throat: Oropharynx is clear and moist.  Eyes: Conjunctivae are normal. No scleral icterus.  Neck: Normal range of motion. Neck supple.  Cardiovascular: Normal rate and regular rhythm.  Pulmonary/Chest: Effort normal. No  respiratory distress. She has wheezes (subtle inspiratory wheeze).  Rhonchi noted to bilat upper lobes; improved with deep cough  Abdominal: Soft. Bowel sounds are normal. There is no tenderness.  Musculoskeletal: Normal range of motion. She exhibits no edema.  Lymphadenopathy:    She has no cervical adenopathy.       Right: No supraclavicular adenopathy present.       Left: No supraclavicular adenopathy present.  Neurological: She is alert and oriented to person, place, and time. No cranial nerve deficit.  Skin: Skin is warm and dry. No rash noted.  Psychiatric: She has a normal mood and affect. Her behavior is normal. Judgment and thought content normal.  Nursing note and vitals reviewed.    LABORATORY DATA:  I have reviewed the labs as listed.  CBC    Component Value Date/Time   WBC 12.3 (H) 05/16/2017 0821   RBC 3.90 05/16/2017 0821   HGB 11.4 (L) 05/16/2017 0821   HCT 36.5 05/16/2017 0821   PLT 216 05/16/2017 0821   MCV 93.6 05/16/2017 0821   MCH 29.2 05/16/2017 0821   MCHC 31.2 05/16/2017 0821   RDW 13.4 05/16/2017 0821   LYMPHSABS 0.9 05/16/2017 0821   MONOABS 0.5 05/16/2017 0821   EOSABS 0.1 05/16/2017 0821   BASOSABS 0.0 05/16/2017 0821   CMP Latest Ref Rng & Units 05/16/2017 03/28/2017 02/28/2017  Glucose 65 - 99 mg/dL 98 99 121(H)  BUN 6 - 20 mg/dL 15 11 17   Creatinine 0.44 - 1.00 mg/dL 0.96 0.92 1.00  Sodium 135 - 145 mmol/L 139 139 135  Potassium 3.5 - 5.1 mmol/L 3.5 3.5 3.6  Chloride 101 - 111 mmol/L 98(L) 102 97(L)  CO2 22 - 32 mmol/L 32 28 28  Calcium 8.9 - 10.3 mg/dL 8.7(L) 8.8(L) 8.9  Total Protein 6.5 - 8.1 g/dL 6.9 6.7 7.1  Total Bilirubin 0.3 - 1.2 mg/dL 0.7 0.5 0.6  Alkaline Phos 38 - 126 U/L 99 89 97  AST 15 - 41 U/L 15 21 18   ALT 14 - 54 U/L 10(L) 13(L) 10(L)    PENDING LABS:    DIAGNOSTIC IMAGING:    PATHOLOGY:     ASSESSMENT & PLAN:   Hypogammaglobulinemia:  -Well-managed on IVIG.  Recently changed frequency of infusions from every  4 weeks to every 6 weeks.  She did have one episode of fevers/infection in that time, which was treated and resolved with oral course of antibiotics (prescribed by PCP) for sinusitis.  Clinically, no active signs/symptoms of infection at this time.  Mildly elevated WBCs at 12.3 today; historically, she has transient leukocytosis that is self-limiting.  Mild normocytic anemia appreciated with Hgb 11.4 g/dL; she is largely asymptomatic.   -Will plan to continue IVIG at every 6 week intervals.  Of course, if she has recurrent/frequent infections and/or her immunoglobulin levels decline, then we can return to every 4 week scheduling.  Her immunoglobulin trough levels have been stable for quite some time.   -Return to cancer center in 12 weeks for follow-up with subsequent IVIG infusion.   Non-Hodgkin lymphoma/DLBCL: -Diagnosed and treated in  2011.   -Clinically without evidence of disease recurrence.  PET scan from 03/20/17 showed no findings to suggest active lymphoma.  She is now 8+ years out from her initial diagnosis without evidence of recurrent disease; this is very favorable.  -Continue observation. Future imaging will only be ordered as clinically indicated.       Dispo:  -Continue IVIG every 6 weeks.  -Return to cancer center for follow-up in 12 weeks with subsequent IVIG infusion.    All questions were answered to patient's stated satisfaction. Encouraged patient to call with any new concerns or questions before her next visit to the cancer center and we can certain see her sooner, if needed.      Orders placed this encounter:  No orders of the defined types were placed in this encounter.     Mike Craze, NP [place] [phone]

## 2017-05-16 NOTE — Progress Notes (Signed)
[id number] Labs reviewed with and pt seen by Mike Craze NP and pt approved for IVIG infusion today per NP                                                                             [name] tolerated IVIG infusion well without complaints or incident. VSS upon discharge. Pt discharged self ambulatory in satisfactory condition

## 2017-05-16 NOTE — Patient Instructions (Signed)
[facility] at [facility] Discharge Instructions  Received IVIG infusion today. Follow-up as scheduled. Call clinic for any questions or concerns   Thank you for choosing [facility] at [facility] to provide your oncology and hematology care.  To afford each patient quality time with our provider, please arrive at least 15 minutes before your scheduled appointment time.   If you have a lab appointment with the Cancer Center please come in thru the  Main Entrance and check in at the main information desk  You need to re-schedule your appointment should you arrive 10 or more minutes late.  We strive to give you quality time with our providers, and arriving late affects you and other patients whose appointments are after yours.  Also, if you no show three or more times for appointments you may be dismissed from the clinic at the providers discretion.     Again, thank you for choosing [facility].  Our hope is that these requests will decrease the amount of time that you wait before being seen by our physicians.       _____________________________________________________________  Should you have questions after your visit to [facility], please contact our office at [phone] between the hours of 8:30 a.m. and 4:30 p.m.  Voicemails left after 4:30 p.m. will not be returned until the following business day.  For prescription refill requests, have your pharmacy contact our office.       Resources For Cancer Patients and their Caregivers ? American Cancer Society: Can assist with transportation, wigs, general needs, runs Look Good Feel Better.        1-888-227-6333 ? Cancer Care: Provides financial assistance, online support groups, medication/co-pay assistance.  1-800-813-HOPE (4673) ? [facility] Assists [place] cancer patients and their families through emotional , educational and  financial support.  336-427-4357 ? [place] DSS Where to apply for food stamps, Medicaid and utility assistance. 336-342-1394 ? [facility]: Transportation to medical appointments. 336-347-2287 ? Social Security Administration: May apply for disability if have a Stage IV cancer. 336-342-7796 1-800-772-1213 ? [place] Aging, Disability and Transit Services: Assists with nutrition, care and transit needs. 336-349-2343  Cancer Center Support Programs:   > Cancer Support Group  2nd Tuesday of the month 1pm-2pm, Journey Room   > Creative Journey  3rd Tuesday of the month 1130am-1pm, Journey Room    

## 2017-05-17 LAB — IGG, IGA, IGM
IGG (IMMUNOGLOBIN G), SERUM: 1138 mg/dL (ref 700–1600)
IgA: 5 mg/dL — ABNORMAL LOW (ref 87–352)
IgM (Immunoglobulin M), Srm: 60 mg/dL (ref 26–217)

## 2017-06-08 ENCOUNTER — Encounter: Payer: Self-pay | Admitting: Family Medicine

## 2017-06-09 ENCOUNTER — Encounter: Payer: Self-pay | Admitting: Family Medicine

## 2017-06-26 ENCOUNTER — Other Ambulatory Visit (HOSPITAL_COMMUNITY): Payer: Self-pay

## 2017-06-26 DIAGNOSIS — D801 Nonfamilial hypogammaglobulinemia: Secondary | ICD-10-CM

## 2017-06-27 ENCOUNTER — Inpatient Hospital Stay (HOSPITAL_COMMUNITY): Payer: Medicare Other

## 2017-06-27 ENCOUNTER — Encounter (HOSPITAL_COMMUNITY): Payer: Self-pay

## 2017-06-27 ENCOUNTER — Inpatient Hospital Stay (HOSPITAL_COMMUNITY): Payer: Medicare Other | Attending: Hematology

## 2017-06-27 ENCOUNTER — Other Ambulatory Visit: Payer: Self-pay

## 2017-06-27 VITALS — BP 144/84 | HR 77 | Temp 98.0°F | Resp 16 | Wt 151.4 lb

## 2017-06-27 DIAGNOSIS — D649 Anemia, unspecified: Secondary | ICD-10-CM | POA: Insufficient documentation

## 2017-06-27 DIAGNOSIS — D72829 Elevated white blood cell count, unspecified: Secondary | ICD-10-CM | POA: Insufficient documentation

## 2017-06-27 DIAGNOSIS — Z8572 Personal history of non-Hodgkin lymphomas: Secondary | ICD-10-CM | POA: Diagnosis not present

## 2017-06-27 DIAGNOSIS — D801 Nonfamilial hypogammaglobulinemia: Secondary | ICD-10-CM

## 2017-06-27 DIAGNOSIS — Z79899 Other long term (current) drug therapy: Secondary | ICD-10-CM | POA: Diagnosis not present

## 2017-06-27 LAB — CBC WITH DIFFERENTIAL/PLATELET
Basophils Absolute: 0 10*3/uL (ref 0.0–0.1)
Basophils Relative: 0 %
Eosinophils Absolute: 0.2 10*3/uL (ref 0.0–0.7)
Eosinophils Relative: 2 %
HCT: 38.4 % (ref 36.0–46.0)
HEMOGLOBIN: 11.9 g/dL — AB (ref 12.0–15.0)
LYMPHS ABS: 1.9 10*3/uL (ref 0.7–4.0)
LYMPHS PCT: 27 %
MCH: 29.5 pg (ref 26.0–34.0)
MCHC: 31 g/dL (ref 30.0–36.0)
MCV: 95.3 fL (ref 78.0–100.0)
Monocytes Absolute: 0.6 10*3/uL (ref 0.1–1.0)
Monocytes Relative: 8 %
NEUTROS ABS: 4.4 10*3/uL (ref 1.7–7.7)
NEUTROS PCT: 63 %
Platelets: 198 10*3/uL (ref 150–400)
RBC: 4.03 MIL/uL (ref 3.87–5.11)
RDW: 13.9 % (ref 11.5–15.5)
WBC: 7.1 10*3/uL (ref 4.0–10.5)

## 2017-06-27 LAB — COMPREHENSIVE METABOLIC PANEL
ALT: 9 U/L (ref 0–44)
AST: 16 U/L (ref 15–41)
Albumin: 3.5 g/dL (ref 3.5–5.0)
Alkaline Phosphatase: 95 U/L (ref 38–126)
Anion gap: 9 (ref 5–15)
BILIRUBIN TOTAL: 0.5 mg/dL (ref 0.3–1.2)
BUN: 18 mg/dL (ref 8–23)
CO2: 31 mmol/L (ref 22–32)
CREATININE: 0.96 mg/dL (ref 0.44–1.00)
Calcium: 8.8 mg/dL — ABNORMAL LOW (ref 8.9–10.3)
Chloride: 102 mmol/L (ref 98–111)
GFR calc Af Amer: >60 mL/min (ref >60)
GLUCOSE: 94 mg/dL (ref 70–99)
Potassium: 3.7 mmol/L (ref 3.5–5.1)
Sodium: 142 mmol/L (ref 135–145)
TOTAL PROTEIN: 6.8 g/dL (ref 6.5–8.1)

## 2017-06-27 LAB — LACTATE DEHYDROGENASE: LDH: 144 U/L (ref 98–192)

## 2017-06-27 MED ORDER — IMMUNE GLOBULIN (HUMAN) 10 GM/100ML IV SOLN
50.0000 g | Freq: Once | INTRAVENOUS | Status: AC
  Administered 2017-06-27: 50 g via INTRAVENOUS
  Filled 2017-06-27: qty 400

## 2017-06-27 MED ORDER — DIPHENHYDRAMINE HCL 25 MG PO CAPS: 25.0000 mg | ORAL_CAPSULE | Freq: Once | ORAL | Status: DC

## 2017-06-27 MED ORDER — DEXTROSE 5 % IV SOLN
INTRAVENOUS | Status: DC
  Administered 2017-06-27: 10:00:00 via INTRAVENOUS

## 2017-06-27 MED ORDER — ACETAMINOPHEN 325 MG PO TABS: 650.0000 mg | ORAL_TABLET | Freq: Four times a day (QID) | ORAL | Status: DC | PRN

## 2017-06-27 NOTE — Patient Instructions (Signed)
[place] at [facility] Discharge Instructions  IVIG given today Follow up as scheduled.   Thank you for choosing [place] at [facility] to provide your oncology and hematology care.  To afford each patient quality time with our provider, please arrive at least 15 minutes before your scheduled appointment time.   If you have a lab appointment with the [facility] please come in thru the  Main Entrance and check in at the main information desk  You need to re-schedule your appointment should you arrive 10 or more minutes late.  We strive to give you quality time with our providers, and arriving late affects you and other patients whose appointments are after yours.  Also, if you no show three or more times for appointments you may be dismissed from the clinic at the providers discretion.     Again, thank you for choosing [facility].  Our hope is that these requests will decrease the amount of time that you wait before being seen by our physicians.       _____________________________________________________________  Should you have questions after your visit to [facility], please contact our office at [phone] between the hours of 8:30 a.m. and 4:30 p.m.  Voicemails left after 4:30 p.m. will not be returned until the following business day.  For prescription refill requests, have your pharmacy contact our office.       Resources For Cancer Patients and their Caregivers ? American Cancer Society: Can assist with transportation, wigs, general needs, runs Look Good Feel Better.        5644482732 ? Cancer Care: Provides financial assistance, online support groups, medication/co-pay assistance.  1-800-813-HOPE (925) 391-2632) ? [facility] Assists [place] cancer patients and their families through emotional , educational and financial support.  (971) 658-8464 ? [place] DSS Where  to apply for food stamps, Medicaid and utility assistance. (667)887-0666 ? RCATS: Transportation to medical appointments. (417)562-4995 ? Social Security Administration: May apply for disability if have a Stage IV cancer. 519 670 2547 856-293-4046 ? [employer], Disability and Transit Services: Assists with nutrition, care and transit needs. [facility] Support Programs:   > Cancer Support Group  2nd Tuesday of the month 1pm-2pm, Journey Room   > Creative Journey  3rd Tuesday of the month 1130am-1pm, Journey Room

## 2017-06-27 NOTE — Progress Notes (Signed)
Treatment given per orders. Patient tolerated it well without problems. Vitals stable and discharged home from clinic ambulatory. Follow up as scheduled.  

## 2017-06-28 LAB — IGG, IGA, IGM
IGG (IMMUNOGLOBIN G), SERUM: 1106 mg/dL (ref 700–1600)
IgA: 6 mg/dL — ABNORMAL LOW (ref 87–352)
IgM (Immunoglobulin M), Srm: 69 mg/dL (ref 26–217)

## 2017-06-30 ENCOUNTER — Telehealth: Payer: Self-pay | Admitting: Adult Health

## 2017-06-30 MED ORDER — ZAFIRLUKAST 20 MG PO TABS: 20.0000 mg | ORAL_TABLET | Freq: Two times a day (BID) | ORAL | 1 refills | Status: DC

## 2017-06-30 NOTE — Telephone Encounter (Signed)
Pt seen last by TP 2-19 and follow up with RB 08-2017. Pt is aware Rx refill has been sent this time and 1 additional refill until seen by RB and he will assume filling Rx's for her. Nothing more needed at this time.

## 2017-07-11 DIAGNOSIS — G47419 Narcolepsy without cataplexy: Secondary | ICD-10-CM | POA: Diagnosis not present

## 2017-07-11 DIAGNOSIS — E1140 Type 2 diabetes mellitus with diabetic neuropathy, unspecified: Secondary | ICD-10-CM | POA: Diagnosis not present

## 2017-07-11 DIAGNOSIS — Z79899 Other long term (current) drug therapy: Secondary | ICD-10-CM | POA: Diagnosis not present

## 2017-07-11 DIAGNOSIS — G4733 Obstructive sleep apnea (adult) (pediatric): Secondary | ICD-10-CM | POA: Diagnosis not present

## 2017-08-08 ENCOUNTER — Inpatient Hospital Stay (HOSPITAL_COMMUNITY): Payer: Medicare Other | Attending: Hematology

## 2017-08-08 ENCOUNTER — Inpatient Hospital Stay (HOSPITAL_BASED_OUTPATIENT_CLINIC_OR_DEPARTMENT_OTHER): Payer: Medicare Other | Admitting: Hematology

## 2017-08-08 ENCOUNTER — Inpatient Hospital Stay (HOSPITAL_COMMUNITY): Payer: Medicare Other

## 2017-08-08 ENCOUNTER — Other Ambulatory Visit: Payer: Self-pay

## 2017-08-08 ENCOUNTER — Encounter (HOSPITAL_COMMUNITY): Payer: Self-pay | Admitting: Hematology

## 2017-08-08 VITALS — BP 178/82 | HR 66 | Temp 98.6°F | Resp 16 | Wt 145.8 lb

## 2017-08-08 DIAGNOSIS — C8330 Diffuse large B-cell lymphoma, unspecified site: Secondary | ICD-10-CM | POA: Diagnosis not present

## 2017-08-08 DIAGNOSIS — D801 Nonfamilial hypogammaglobulinemia: Secondary | ICD-10-CM | POA: Diagnosis not present

## 2017-08-08 DIAGNOSIS — Z9221 Personal history of antineoplastic chemotherapy: Secondary | ICD-10-CM | POA: Insufficient documentation

## 2017-08-08 DIAGNOSIS — C8338 Diffuse large B-cell lymphoma, lymph nodes of multiple sites: Secondary | ICD-10-CM

## 2017-08-08 LAB — CBC WITH DIFFERENTIAL/PLATELET
BASOS PCT: 0 %
Basophils Absolute: 0 10*3/uL (ref 0.0–0.1)
EOS ABS: 0.1 10*3/uL (ref 0.0–0.7)
EOS PCT: 2 %
HCT: 37.9 % (ref 36.0–46.0)
HEMOGLOBIN: 11.9 g/dL — AB (ref 12.0–15.0)
LYMPHS ABS: 1.1 10*3/uL (ref 0.7–4.0)
Lymphocytes Relative: 15 %
MCH: 29.7 pg (ref 26.0–34.0)
MCHC: 31.4 g/dL (ref 30.0–36.0)
MCV: 94.5 fL (ref 78.0–100.0)
Monocytes Absolute: 0.5 10*3/uL (ref 0.1–1.0)
Monocytes Relative: 7 %
NEUTROS PCT: 76 %
Neutro Abs: 5.7 10*3/uL (ref 1.7–7.7)
PLATELETS: 182 10*3/uL (ref 150–400)
RBC: 4.01 MIL/uL (ref 3.87–5.11)
RDW: 13.3 % (ref 11.5–15.5)
WBC: 7.4 10*3/uL (ref 4.0–10.5)

## 2017-08-08 LAB — LACTATE DEHYDROGENASE: LDH: 154 U/L (ref 98–192)

## 2017-08-08 LAB — COMPREHENSIVE METABOLIC PANEL
ALT: 11 U/L (ref 0–44)
AST: 18 U/L (ref 15–41)
Albumin: 3.6 g/dL (ref 3.5–5.0)
Alkaline Phosphatase: 87 U/L (ref 38–126)
Anion gap: 6 (ref 5–15)
BUN: 9 mg/dL (ref 8–23)
CHLORIDE: 103 mmol/L (ref 98–111)
CO2: 30 mmol/L (ref 22–32)
Calcium: 8.8 mg/dL — ABNORMAL LOW (ref 8.9–10.3)
Creatinine, Ser: 0.94 mg/dL (ref 0.44–1.00)
Glucose, Bld: 88 mg/dL (ref 70–99)
POTASSIUM: 3.7 mmol/L (ref 3.5–5.1)
SODIUM: 139 mmol/L (ref 135–145)
Total Bilirubin: 0.6 mg/dL (ref 0.3–1.2)
Total Protein: 6.9 g/dL (ref 6.5–8.1)

## 2017-08-08 MED ORDER — ACETAMINOPHEN 325 MG PO TABS: 650.0000 mg | ORAL_TABLET | Freq: Four times a day (QID) | ORAL | Status: DC | PRN

## 2017-08-08 MED ORDER — DEXTROSE 5 % IV SOLN
INTRAVENOUS | Status: DC
  Administered 2017-08-08: 10:00:00 via INTRAVENOUS

## 2017-08-08 MED ORDER — OCTREOTIDE ACETATE 30 MG IM KIT
PACK | INTRAMUSCULAR | Status: AC
  Filled 2017-08-08: qty 1

## 2017-08-08 MED ORDER — DIPHENHYDRAMINE HCL 25 MG PO TABS
25.0000 mg | ORAL_TABLET | Freq: Once | ORAL | Status: DC
  Filled 2017-08-08: qty 1

## 2017-08-08 MED ORDER — IMMUNE GLOBULIN (HUMAN) 10 GM/100ML IV SOLN
50.0000 g | Freq: Once | INTRAVENOUS | Status: AC
  Administered 2017-08-08: 50 g via INTRAVENOUS
  Filled 2017-08-08: qty 400

## 2017-08-08 NOTE — Patient Instructions (Signed)
[place] at [facility] Discharge Instructions  Please get treatment in 6 weeks and we will see you back for a visit with the doctor in 12 weeks.    Thank you for choosing [place] at [facility] to provide your oncology and hematology care.  To afford each patient quality time with our provider, please arrive at least 15 minutes before your scheduled appointment time.   If you have a lab appointment with the [place] please come in thru the  Main Entrance and check in at the main information desk  You need to re-schedule your appointment should you arrive 10 or more minutes late.  We strive to give you quality time with our providers, and arriving late affects you and other patients whose appointments are after yours.  Also, if you no show three or more times for appointments you may be dismissed from the clinic at the providers discretion.     Again, thank you for choosing [facility].  Our hope is that these requests will decrease the amount of time that you wait before being seen by our physicians.       _____________________________________________________________  Should you have questions after your visit to [facility], please contact our office at [phone] between the hours of 8:00 a.m. and 4:30 p.m.  Voicemails left after 4:00 p.m. will not be returned until the following business day.  For prescription refill requests, have your pharmacy contact our office and allow 72 hours.    Cancer Center Support Programs:   > Cancer Support Group  2nd Tuesday of the month 1pm-2pm, Journey Room

## 2017-08-08 NOTE — Assessment & Plan Note (Signed)
1.  Acquired hypogammaglobulinemia: -We have spread out her IVIG to once every 6 weeks for the last 2 times.  She had one sinus infection and had to take antibiotic in the last 12 weeks.  She did not experience any more infections.  Hence we will continue the same frequency of IVIG. -If she does not have any more infections, I will consider spreading it out further to once every 8 weeks.  Her immunoglobulin trough levels are remaining above 1000.  2.  Diffuse large B-cell lymphoma, stage IVb: She was treated with 4 cycles of R CHOP with intrathecal methotrexate during cycle 3 on 4.  She had developed Pseudomonas sepsis after cycles 2 and 4 and therapy was truncated after 4 cycles.  She also received 2 years of maintenance rituximab.  PET CT scan on 03/20/2017 did not show any evidence of lymphoma.  3.  Left lung nodule: There is no evidence of left lung nodule on the PET/CT scan from March 2019.

## 2017-08-08 NOTE — Patient Instructions (Signed)
[place] at [facility] Discharge Instructions IVIG given today per orders. Follow up as scheduled.   Thank you for choosing [place] at [facility] to provide your oncology and hematology care.  To afford each patient quality time with our provider, please arrive at least 15 minutes before your scheduled appointment time.   If you have a lab appointment with the [place] please come in thru the  Main Entrance and check in at the main information desk  You need to re-schedule your appointment should you arrive 10 or more minutes late.  We strive to give you quality time with our providers, and arriving late affects you and other patients whose appointments are after yours.  Also, if you no show three or more times for appointments you may be dismissed from the clinic at the providers discretion.     Again, thank you for choosing [facility].  Our hope is that these requests will decrease the amount of time that you wait before being seen by our physicians.       _____________________________________________________________  Should you have questions after your visit to [facility], please contact our office at [phone] between the hours of 8:00 a.m. and 4:30 p.m.  Voicemails left after 4:00 p.m. will not be returned until the following business day.  For prescription refill requests, have your pharmacy contact our office and allow 72 hours.    Cancer Center Support Programs:   > Cancer Support Group  2nd Tuesday of the month 1pm-2pm, Journey Room

## 2017-08-08 NOTE — Progress Notes (Signed)
Treatment given per orders. Patient tolerated it well without problems. Vitals stable and discharged home from clinic ambulatory. Follow up as scheduled.  

## 2017-08-08 NOTE — Progress Notes (Signed)
[place], [place] [address]   CLINIC:  Medical Oncology/Hematology  PCP:  Caren Macadam, [place] [address] [phone]   REASON FOR VISIT:  Follow-up for hypogammaglobulinemia AND non-hodgkin lymphoma   CURRENT THERAPY: IVIG every 6 weeks AND observation  BRIEF ONCOLOGIC HISTORY:    Non Hodgkin's lymphoma ([place])   09/09/2009 Initial Diagnosis    Non Hodgkin's lymphoma ([place])        CANCER STAGING: Cancer Staging Non Hodgkin's lymphoma ([place]) Staging form: Lymphoid Neoplasms, AJCC 6th Edition - Clinical: Stage IV - Signed by Baird Cancer, PA on 11/05/2010    INTERVAL HISTORY:  [name] 62 y.o. female returns for routine follow-up hypogammaglobulinemia and non-hodgkin lymphoma. Patient has been doing well since her last visit. She still get sinus infections regularly and has only needed antibiotics once since her last visit 12 weeks ago. She always has a productive cough and wheezing. She is following up with her Pulmonologist for her asthma. She is not having any more infections than normal even though her treatments are every 4 weeks instead of every 6 weeks. Patient lives at home and performs all her own ADLs and tries to remain active. She reports her appetite and energy levels are remaining good at 75%. Patient denies any new pains. Denies any nausea, vomiting, or diarrhea.     REVIEW OF SYSTEMS:  Review of Systems  Constitutional: Positive for chills and fever.  HENT:   Positive for mouth sores.   All other systems reviewed and are negative.    PAST MEDICAL/SURGICAL HISTORY:  Past Medical History:  Diagnosis Date  . Allergic rhinitis   . Allergy   . Anemia   . Anxiety   . Asthma   . B12 deficiency 01/18/2015  . Blood transfusion without reported diagnosis   . Cataract   . Cavitary lung disease   . Chronic kidney disease    related to cancer  . Chronic sinusitis   . Clotting disorder  ([place])   . COPD (chronic obstructive pulmonary disease) ([place])   . Depression   . DVT (deep venous thrombosis) ([place]) 09/25/2014  . Endotracheally intubated   . GERD (gastroesophageal reflux disease)   . Hematuria 04/07/2016  . Hyperlipidemia   . Hypertension   . Hypogammaglobulinemia, acquired ([place]) 12/30/2009   Qualifier: Diagnosis of  By: Joya Gaskins MD, Burnett Harry   . Iron deficiency anemia 01/18/2015  . Myocardial infarction ([place]) 2011  . 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 Non Hodgkin's lymphoma ([place]) 2011  . Orofacial dyskinesia 04/22/2015  . Orofacial dyskinesia 04/22/2015  . Osteoporosis   . Peripheral neuropathy due to chemotherapy ([place]) 05/02/2016  . Pneumonia 01/2013  . PONV (postoperative nausea and vomiting)   . Respiratory failure ([place])   . Sleep apnea   . SVC syndrome 09/25/2014   Past Surgical History:  Procedure Laterality Date  . ABDOMINAL HYSTERECTOMY     Fibroids  . BASAL CELL CARCINOMA EXCISION  03/2011   scalp  . BREAST SURGERY Right    biopsy  . CATARACT EXTRACTION W/PHACO Left 11/17/2014   Procedure: CATARACT EXTRACTION PHACO AND INTRAOCULAR LENS PLACEMENT LEFT EYE;  Surgeon: Tonny Branch, MD;  Location: [facility];  Service: Ophthalmology;  Laterality: Left;  CDE:5.60  . CATARACT EXTRACTION W/PHACO Right 12/15/2014   Procedure: CATARACT EXTRACTION PHACO AND INTRAOCULAR LENS PLACEMENT RIGHT EYE CDE=5.16;  Surgeon: Tonny Branch, MD;  Location: [facility];  Service: Ophthalmology;  Laterality: Right;  . COLONOSCOPY N/A 11/14/2012   Procedure: COLONOSCOPY;  Surgeon: Rogene Houston, MD;  Location: [facility] SUITE;  Service: Endoscopy;  Laterality: N/A;  830  . EYE SURGERY    . NASAL SINUS SURGERY    . NECK SURGERY     x 2   . PERIPHERALLY INSERTED CENTRAL CATHETER INSERTION    . PICC Removal    . PORT-A-CATH REMOVAL    . PORTACATH PLACEMENT  7/11   Removed 6/12  . TRACHEOSTOMY    . VESICOVAGINAL FISTULA CLOSURE W/ TAH       SOCIAL HISTORY:  Social History    Socioeconomic History  . Marital status: Divorced    Spouse name: Not on file  . Number of children: 3  . Years of education: 23  . Highest education level: Not on file  Occupational History  . Occupation: Estate agent    Comment: [employer]  . Financial resource strain: Not hard at all  . Food insecurity:    Worry: Never true    Inability: Never true  . Transportation needs:    Medical: No    Non-medical: No  Tobacco Use  . Smoking status: Never Smoker  . Smokeless tobacco: Never Used  Substance and Sexual Activity  . Alcohol use: No  . Drug use: No  . Sexual activity: Never    Birth control/protection: Surgical    Comment: hyst  Lifestyle  . Physical activity:    Days per week: 0 days    Minutes per session: 0 min  . Stress: Not at all  Relationships  . Social connections:    Talks on phone: More than three times a week    Gets together: More than three times a week    Attends religious service: More than 4 times per year    Active member of club or organization: Yes    Attends meetings of clubs or organizations: More than 4 times per year    Relationship status: Divorced  . Intimate partner violence:    Fear of current or ex partner: No    Emotionally abused: No    Physically abused: No    Forced sexual activity: No  Other Topics Concern  . Not on file  Social History Narrative   Originally from [place]. Always lived in [place]. Prior travel to [place]. 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 She has 1 dog currently. She has 1 conures (small parrots). No mold exposure in her home. 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 No hot tub exposure. She enjoys sewing & quilting.     FAMILY HISTORY:  Family History  Problem Relation Age of Onset  . Emphysema Mother   . Stroke Mother   . COPD Mother   . Heart disease Mother        died in sleep  34  . Allergies Father   . Asthma Father         as a child  . Arthritis Father   . Parkinson's disease Father 17  . Leukemia Maternal Grandmother   . Cancer Maternal Grandmother        Leukemia  . Diabetes Brother   . Heart attack Brother   . Hypertension Brother   . Heart disease Brother 40       stents  . Hypertension Sister   . Hypertension Brother  CURRENT MEDICATIONS:  Outpatient Encounter Medications as of 08/08/2017  Medication Sig Note  . acetaminophen (TYLENOL) 500 MG tablet Take 1,000 mg by mouth every 6 (six) hours as needed for pain or fever.    [name] Kitchen acyclovir (ZOVIRAX) 400 MG tablet TAKE ONE TABLET BY MOUTH TWICE DAILY.   [name] Kitchen albuterol (PROAIR HFA) 108 (90 Base) MCG/ACT inhaler Inhale 2 puffs into the lungs every 6 (six) hours as needed.   [name] Kitchen albuterol (PROVENTIL) (2.5 MG/3ML) 0.083% nebulizer solution Take 2.5 mg by nebulization every 4 (four) hours as needed for wheezing or shortness of breath.   [name] Kitchen alendronate (FOSAMAX) 70 MG tablet Take 1 tablet (70 mg total) by mouth once a week. Take with a full glass of water on an empty stomach.   [name] Kitchen amphetamine-dextroamphetamine (ADDERALL) 10 MG tablet Take 10 mg by mouth daily. 04/22/2015: Received from: External Pharmacy Received Sig:   . Armodafinil (NUVIGIL) 150 MG tablet Take 150 mg by mouth daily.   . budesonide-formoterol (SYMBICORT) 160-4.5 MCG/ACT inhaler Inhale 2 puffs into the lungs 2 (two) times daily.   . citalopram (CELEXA) 20 MG tablet Take 20 mg by mouth every morning.     . cyanocobalamin 1000 MCG tablet Take 1,000 mcg by mouth daily.   . fluticasone (FLONASE) 50 MCG/ACT nasal spray Place 2 sprays into both nostrils daily.    . furosemide (LASIX) 20 MG tablet Take 1 tablet (20 mg total) by mouth as needed for fluid.   [name] Kitchen gabapentin (NEURONTIN) 600 MG tablet Take 300 mg by mouth 3 (three) times daily.    [name] Kitchen omeprazole (PRILOSEC) 20 MG capsule TAKE ONE CAPSULE BY MOUTH TWICE DAILY BEFORE A MEAL.   [name] Kitchen Potassium Chloride 40 MEQ/15ML (20%) SOLN Take 20 mEq by mouth 2  (two) times daily. 20 mEq equals 7.5 mL (Patient not taking: Reported on 08/08/2017)   . predniSONE (DELTASONE) 5 MG tablet Take 1 tablet (5 mg total) by mouth daily with breakfast.   . simvastatin (ZOCOR) 80 MG tablet Take 1 tablet (80 mg total) by mouth at bedtime.   [name] Kitchen tiotropium (SPIRIVA) 18 MCG inhalation capsule Place 1 capsule (18 mcg total) into inhaler and inhale daily.   . zafirlukast (ACCOLATE) 20 MG tablet Take 1 tablet (20 mg total) by mouth 2 (two) times daily.   . [DISCONTINUED] predniSONE (DELTASONE) 20 MG tablet Take 1 tablet (20 mg total) by mouth 2 (two) times daily with a meal.    Facility-Administered Encounter Medications as of 08/08/2017  Medication  . 0.9 %  sodium chloride infusion  . 0.9 %  sodium chloride infusion  . acetaminophen (TYLENOL) tablet 650 mg  . acetaminophen (TYLENOL) tablet 650 mg  . dextrose 5 % solution  . dextrose 5 % solution  . dextrose 5 % solution  . diphenhydrAMINE (BENADRYL) capsule 25 mg  . diphenhydrAMINE (BENADRYL) capsule 25 mg  . sodium chloride 0.9 % injection 10 mL    ALLERGIES:  Allergies  Allergen Reactions  . Meperidine Hcl Anaphylaxis  . Montelukast Sodium Hives and Rash     PHYSICAL EXAM:  ECOG Performance status: 1  VITAL SIGNS: BP: 164/82, P:72, R: 18, TEMP: 98.5, SATS: 99%  Physical Exam  Constitutional: She is oriented to person, place, and time.  Cardiovascular: Normal rate, regular rhythm and normal heart sounds.  Pulmonary/Chest: She has wheezes.  Neurological: She is alert and oriented to person, place, and time.  Skin: Skin is warm and dry.     LABORATORY DATA:  I have  reviewed the labs as listed.  CBC    Component Value Date/Time   WBC 7.4 08/08/2017 0820   RBC 4.01 08/08/2017 0820   HGB 11.9 (L) 08/08/2017 0820   HCT 37.9 08/08/2017 0820   PLT 182 08/08/2017 0820   MCV 94.5 08/08/2017 0820   MCH 29.7 08/08/2017 0820   MCHC 31.4 08/08/2017 0820   RDW 13.3 08/08/2017 0820   LYMPHSABS 1.1  08/08/2017 0820   MONOABS 0.5 08/08/2017 0820   EOSABS 0.1 08/08/2017 0820   BASOSABS 0.0 08/08/2017 0820   CMP Latest Ref Rng & Units 08/08/2017 06/27/2017 05/16/2017  Glucose 70 - 99 mg/dL 88 94 98  BUN 8 - 23 mg/dL 9 18 15   Creatinine 0.44 - 1.00 mg/dL 0.94 0.96 0.96  Sodium 135 - 145 mmol/L 139 142 139  Potassium 3.5 - 5.1 mmol/L 3.7 3.7 3.5  Chloride 98 - 111 mmol/L 103 102 98(L)  CO2 22 - 32 mmol/L 30 31 32  Calcium 8.9 - 10.3 mg/dL 8.8(L) 8.8(L) 8.7(L)  Total Protein 6.5 - 8.1 g/dL 6.9 6.8 6.9  Total Bilirubin 0.3 - 1.2 mg/dL 0.6 0.5 0.7  Alkaline Phos 38 - 126 U/L 87 95 99  AST 15 - 41 U/L 18 16 15   ALT 0 - 44 U/L 11 9 10(L)          ASSESSMENT & PLAN:   Non Hodgkin's lymphoma ([facility]) 1.  Acquired hypogammaglobulinemia: -We have spread out her IVIG to once every 6 weeks for the last 2 times.  She had one sinus infection and had to take antibiotic in the last 12 weeks.  She did not experience any more infections.  Hence we will continue the same frequency of IVIG. -If she does not have any more infections, I will consider spreading it out further to once every 8 weeks.  Her immunoglobulin trough levels are remaining above 1000.  2.  Diffuse large B-cell lymphoma, stage IVb: She was treated with 4 cycles of R CHOP with intrathecal methotrexate during cycle 3 on 4.  She had developed Pseudomonas sepsis after cycles 2 and 4 and therapy was truncated after 4 cycles.  She also received 2 years of maintenance rituximab.  PET CT scan on 03/20/2017 did not show any evidence of lymphoma.  3.  Left lung nodule: There is no evidence of left lung nodule on the PET/CT scan from March 2019.      Orders placed this encounter:  Orders Placed This Encounter  Procedures  . CBC with Differential/Platelet  . Comprehensive metabolic panel  . IgG, IgA, IgM  . Lactate dehydrogenase  . CBC with Differential/Platelet  . Comprehensive metabolic panel  . Lactate dehydrogenase  . IgG, IgA, IgM       Derek Jack, MD [place] [phone]

## 2017-08-09 LAB — IGG, IGA, IGM
IGM (IMMUNOGLOBULIN M), SRM: 60 mg/dL (ref 26–217)
IgA: <5 mg/dL — ABNORMAL LOW (ref 87–352)
IgG (Immunoglobin G), Serum: 1306 mg/dL (ref 700–1600)

## 2017-08-21 ENCOUNTER — Other Ambulatory Visit: Payer: Self-pay | Admitting: Adult Health

## 2017-08-30 ENCOUNTER — Other Ambulatory Visit: Payer: Self-pay | Admitting: Adult Health

## 2017-08-30 DIAGNOSIS — Z1231 Encounter for screening mammogram for malignant neoplasm of breast: Secondary | ICD-10-CM

## 2017-09-08 ENCOUNTER — Ambulatory Visit (HOSPITAL_COMMUNITY)
Admission: RE | Admit: 2017-09-08 | Discharge: 2017-09-08 | Disposition: A | Discharge status: Home / Self Care | Payer: Medicare Other | Source: Ambulatory Visit | Attending: Adult Health | Admitting: Adult Health

## 2017-09-08 ENCOUNTER — Encounter (HOSPITAL_COMMUNITY): Payer: Self-pay

## 2017-09-08 DIAGNOSIS — Z1231 Encounter for screening mammogram for malignant neoplasm of breast: Secondary | ICD-10-CM | POA: Insufficient documentation

## 2017-09-18 ENCOUNTER — Other Ambulatory Visit (HOSPITAL_COMMUNITY): Payer: Self-pay | Admitting: Internal Medicine

## 2017-09-19 ENCOUNTER — Inpatient Hospital Stay (HOSPITAL_COMMUNITY): Payer: Medicare Other

## 2017-09-19 ENCOUNTER — Inpatient Hospital Stay (HOSPITAL_COMMUNITY): Payer: Medicare Other | Attending: Hematology

## 2017-09-19 VITALS — BP 158/81 | HR 74 | Temp 98.5°F | Resp 16 | Wt 144.8 lb

## 2017-09-19 DIAGNOSIS — D801 Nonfamilial hypogammaglobulinemia: Secondary | ICD-10-CM | POA: Insufficient documentation

## 2017-09-19 DIAGNOSIS — C8330 Diffuse large B-cell lymphoma, unspecified site: Secondary | ICD-10-CM | POA: Diagnosis not present

## 2017-09-19 DIAGNOSIS — Z9221 Personal history of antineoplastic chemotherapy: Secondary | ICD-10-CM | POA: Insufficient documentation

## 2017-09-19 LAB — CBC WITH DIFFERENTIAL/PLATELET
BASOS ABS: 0 10*3/uL (ref 0.0–0.1)
BASOS PCT: 0 %
EOS PCT: 3 %
Eosinophils Absolute: 0.2 10*3/uL (ref 0.0–0.7)
HCT: 38.5 % (ref 36.0–46.0)
Hemoglobin: 12 g/dL (ref 12.0–15.0)
Lymphocytes Relative: 17 %
Lymphs Abs: 1.2 10*3/uL (ref 0.7–4.0)
MCH: 29.4 pg (ref 26.0–34.0)
MCHC: 31.2 g/dL (ref 30.0–36.0)
MCV: 94.4 fL (ref 78.0–100.0)
MONO ABS: 0.6 10*3/uL (ref 0.1–1.0)
Monocytes Relative: 9 %
Neutro Abs: 4.9 10*3/uL (ref 1.7–7.7)
Neutrophils Relative %: 71 %
PLATELETS: 177 10*3/uL (ref 150–400)
RBC: 4.08 MIL/uL (ref 3.87–5.11)
RDW: 13.5 % (ref 11.5–15.5)
WBC: 6.9 10*3/uL (ref 4.0–10.5)

## 2017-09-19 LAB — COMPREHENSIVE METABOLIC PANEL
ALBUMIN: 3.7 g/dL (ref 3.5–5.0)
ALT: 14 U/L (ref 0–44)
AST: 21 U/L (ref 15–41)
Alkaline Phosphatase: 89 U/L (ref 38–126)
Anion gap: 11 (ref 5–15)
BUN: 11 mg/dL (ref 8–23)
CHLORIDE: 102 mmol/L (ref 98–111)
CO2: 27 mmol/L (ref 22–32)
Calcium: 8.7 mg/dL — ABNORMAL LOW (ref 8.9–10.3)
Creatinine, Ser: 0.96 mg/dL (ref 0.44–1.00)
GFR calc Af Amer: >60 mL/min (ref >60)
GFR calc non Af Amer: >60 mL/min (ref >60)
GLUCOSE: 110 mg/dL — AB (ref 70–99)
POTASSIUM: 3.5 mmol/L (ref 3.5–5.1)
Sodium: 140 mmol/L (ref 135–145)
Total Bilirubin: 0.9 mg/dL (ref 0.3–1.2)
Total Protein: 7 g/dL (ref 6.5–8.1)

## 2017-09-19 LAB — LACTATE DEHYDROGENASE: LDH: 160 U/L (ref 98–192)

## 2017-09-19 MED ORDER — ACETAMINOPHEN 325 MG PO TABS: 650.0000 mg | ORAL_TABLET | Freq: Four times a day (QID) | ORAL | Status: DC | PRN

## 2017-09-19 MED ORDER — DIPHENHYDRAMINE HCL 25 MG PO CAPS: 25.0000 mg | ORAL_CAPSULE | Freq: Once | ORAL | Status: DC

## 2017-09-19 MED ORDER — DEXTROSE 5 % IV SOLN
INTRAVENOUS | Status: DC
  Administered 2017-09-19: 10:00:00 via INTRAVENOUS

## 2017-09-19 MED ORDER — IMMUNE GLOBULIN (HUMAN) 10 GM/100ML IV SOLN
50.0000 g | Freq: Once | INTRAVENOUS | Status: AC
  Administered 2017-09-19: 50 g via INTRAVENOUS
  Filled 2017-09-19: qty 100

## 2017-09-19 NOTE — Progress Notes (Signed)
Arletha Pili tolerated IVIG infusion without incident or complaint. VSS upon completion of treatment. Discharged self ambulatory in satisfactory condition.

## 2017-09-19 NOTE — Patient Instructions (Signed)
[facility] Discharge Instructions for Patients Receiving Chemotherapy   Beginning January 23rd 2017 lab work for the [facility] will be done in the  Main lab at [facility] on 1st floor. If you have a lab appointment with the [facility] please come in thru the  Main Entrance and check in at the main information desk   Today you received the following chemotherapy agents IVIG. Follow up as scheduled. Call clinic for any questions or concerns.     If you develop nausea and vomiting, or diarrhea that is not controlled by your medication, call the clinic.  The clinic phone number is [phone]. Office hours are Monday-Friday 8:30am-5:00pm.  BELOW ARE SYMPTOMS THAT SHOULD BE REPORTED IMMEDIATELY:  *FEVER GREATER THAN 101.0 F  *CHILLS WITH OR WITHOUT FEVER  NAUSEA AND VOMITING THAT IS NOT CONTROLLED WITH YOUR NAUSEA MEDICATION  *UNUSUAL SHORTNESS OF BREATH  *UNUSUAL BRUISING OR BLEEDING  TENDERNESS IN MOUTH AND THROAT WITH OR WITHOUT PRESENCE OF ULCERS  *URINARY PROBLEMS  *BOWEL PROBLEMS  UNUSUAL RASH Items with * indicate a potential emergency and should be followed up as soon as possible. If you have an emergency after office hours please contact your primary care physician or go to the nearest emergency department.  Please call the clinic during office hours if you have any questions or concerns.   You may also contact the Patient Navigator at [phone] should you have any questions or need assistance in obtaining follow up care.      Resources For Cancer Patients and their Caregivers ? American Cancer Society: Can assist with transportation, wigs, general needs, runs Look Good Feel Better.        929-572-9579 ? Cancer Care: Provides financial assistance, online support groups, medication/co-pay assistance.  1-800-813-HOPE 970-373-4501) ? [place] Assists [place] cancer patients and their families through  emotional , educational and financial support.  (684)545-5641 ? [place] DSS Where to apply for food stamps, Medicaid and utility assistance. 817-172-6289 ? [facility]: Transportation to medical appointments. 737 368 7896 ? Social Security Administration: May apply for disability if have a Stage IV cancer. 705-577-0942 (303)446-2551 ? [employer], Disability and Transit Services: Assists with nutrition, care and transit needs. 571-842-4106

## 2017-09-20 DIAGNOSIS — Z0001 Encounter for general adult medical examination with abnormal findings: Secondary | ICD-10-CM | POA: Diagnosis not present

## 2017-09-20 DIAGNOSIS — Z6825 Body mass index (BMI) 25.0-25.9, adult: Secondary | ICD-10-CM | POA: Diagnosis not present

## 2017-09-20 DIAGNOSIS — E663 Overweight: Secondary | ICD-10-CM | POA: Diagnosis not present

## 2017-09-20 DIAGNOSIS — R7309 Other abnormal glucose: Secondary | ICD-10-CM | POA: Diagnosis not present

## 2017-09-20 LAB — IGG, IGA, IGM
IGM (IMMUNOGLOBULIN M), SRM: 57 mg/dL (ref 26–217)
IgG (Immunoglobin G), Serum: 1189 mg/dL (ref 700–1600)

## 2017-09-21 DIAGNOSIS — Z0001 Encounter for general adult medical examination with abnormal findings: Secondary | ICD-10-CM | POA: Diagnosis not present

## 2017-09-21 DIAGNOSIS — Z23 Encounter for immunization: Secondary | ICD-10-CM | POA: Diagnosis not present

## 2017-09-21 DIAGNOSIS — Z1389 Encounter for screening for other disorder: Secondary | ICD-10-CM | POA: Diagnosis not present

## 2017-09-21 DIAGNOSIS — R7303 Prediabetes: Secondary | ICD-10-CM | POA: Diagnosis not present

## 2017-09-21 DIAGNOSIS — Z6825 Body mass index (BMI) 25.0-25.9, adult: Secondary | ICD-10-CM | POA: Diagnosis not present

## 2017-10-03 ENCOUNTER — Ambulatory Visit: Payer: Medicare Other | Admitting: Family Medicine

## 2017-10-06 ENCOUNTER — Other Ambulatory Visit (HOSPITAL_COMMUNITY): Payer: Self-pay | Admitting: *Deleted

## 2017-10-06 DIAGNOSIS — D801 Nonfamilial hypogammaglobulinemia: Secondary | ICD-10-CM

## 2017-10-06 MED ORDER — ACYCLOVIR 400 MG PO TABS: 400.0000 mg | ORAL_TABLET | Freq: Two times a day (BID) | ORAL | 3 refills | Status: DC

## 2017-10-09 ENCOUNTER — Other Ambulatory Visit (HOSPITAL_COMMUNITY): Payer: Medicare Other

## 2017-10-09 ENCOUNTER — Ambulatory Visit (HOSPITAL_COMMUNITY): Payer: Medicare Other

## 2017-10-30 ENCOUNTER — Inpatient Hospital Stay (HOSPITAL_COMMUNITY): Payer: Medicare Other | Attending: Hematology

## 2017-10-30 ENCOUNTER — Encounter (INDEPENDENT_AMBULATORY_CARE_PROVIDER_SITE_OTHER): Payer: Self-pay | Admitting: *Deleted

## 2017-10-30 DIAGNOSIS — Z9221 Personal history of antineoplastic chemotherapy: Secondary | ICD-10-CM | POA: Insufficient documentation

## 2017-10-30 DIAGNOSIS — D801 Nonfamilial hypogammaglobulinemia: Secondary | ICD-10-CM | POA: Insufficient documentation

## 2017-10-30 DIAGNOSIS — Z79899 Other long term (current) drug therapy: Secondary | ICD-10-CM | POA: Insufficient documentation

## 2017-10-30 DIAGNOSIS — Z8572 Personal history of non-Hodgkin lymphomas: Secondary | ICD-10-CM | POA: Diagnosis not present

## 2017-10-30 LAB — CBC WITH DIFFERENTIAL/PLATELET
ABS IMMATURE GRANULOCYTES: 0.04 10*3/uL (ref 0.00–0.07)
BASOS ABS: 0 10*3/uL (ref 0.0–0.1)
Basophils Relative: 0 %
Eosinophils Absolute: 0 10*3/uL (ref 0.0–0.5)
Eosinophils Relative: 0 %
HEMATOCRIT: 39.8 % (ref 36.0–46.0)
HEMOGLOBIN: 12.1 g/dL (ref 12.0–15.0)
Immature Granulocytes: 0 %
LYMPHS ABS: 1.1 10*3/uL (ref 0.7–4.0)
LYMPHS PCT: 9 %
MCH: 28.3 pg (ref 26.0–34.0)
MCHC: 30.4 g/dL (ref 30.0–36.0)
MCV: 93 fL (ref 80.0–100.0)
Monocytes Absolute: 0.4 10*3/uL (ref 0.1–1.0)
Monocytes Relative: 3 %
NEUTROS ABS: 10.5 10*3/uL — AB (ref 1.7–7.7)
NRBC: 0 % (ref 0.0–0.2)
Neutrophils Relative %: 88 %
Platelets: 203 10*3/uL (ref 150–400)
RBC: 4.28 MIL/uL (ref 3.87–5.11)
RDW: 13.5 % (ref 11.5–15.5)
WBC: 12.2 10*3/uL — AB (ref 4.0–10.5)

## 2017-10-30 LAB — COMPREHENSIVE METABOLIC PANEL
ALBUMIN: 3.7 g/dL (ref 3.5–5.0)
ALK PHOS: 92 U/L (ref 38–126)
ALT: 11 U/L (ref 0–44)
AST: 16 U/L (ref 15–41)
Anion gap: 10 (ref 5–15)
BILIRUBIN TOTAL: 0.7 mg/dL (ref 0.3–1.2)
BUN: 11 mg/dL (ref 8–23)
CALCIUM: 8.8 mg/dL — AB (ref 8.9–10.3)
CO2: 29 mmol/L (ref 22–32)
CREATININE: 0.88 mg/dL (ref 0.44–1.00)
Chloride: 99 mmol/L (ref 98–111)
GFR calc Af Amer: >60 mL/min (ref >60)
GFR calc non Af Amer: >60 mL/min (ref >60)
Glucose, Bld: 118 mg/dL — ABNORMAL HIGH (ref 70–99)
Potassium: 3.9 mmol/L (ref 3.5–5.1)
SODIUM: 138 mmol/L (ref 135–145)
Total Protein: 7.2 g/dL (ref 6.5–8.1)

## 2017-10-30 LAB — LACTATE DEHYDROGENASE: LDH: 186 U/L (ref 98–192)

## 2017-10-31 ENCOUNTER — Inpatient Hospital Stay (HOSPITAL_BASED_OUTPATIENT_CLINIC_OR_DEPARTMENT_OTHER): Payer: Medicare Other | Admitting: Hematology

## 2017-10-31 ENCOUNTER — Inpatient Hospital Stay (HOSPITAL_COMMUNITY): Payer: Medicare Other

## 2017-10-31 ENCOUNTER — Other Ambulatory Visit: Payer: Self-pay

## 2017-10-31 ENCOUNTER — Encounter (HOSPITAL_COMMUNITY): Payer: Self-pay | Admitting: Hematology

## 2017-10-31 ENCOUNTER — Encounter (HOSPITAL_COMMUNITY): Payer: Self-pay

## 2017-10-31 VITALS — BP 164/78 | HR 64 | Temp 98.2°F | Resp 18 | Wt 142.5 lb

## 2017-10-31 VITALS — BP 147/79 | HR 71 | Temp 98.4°F | Resp 18

## 2017-10-31 DIAGNOSIS — Z79899 Other long term (current) drug therapy: Secondary | ICD-10-CM

## 2017-10-31 DIAGNOSIS — Z8572 Personal history of non-Hodgkin lymphomas: Secondary | ICD-10-CM

## 2017-10-31 DIAGNOSIS — C8338 Diffuse large B-cell lymphoma, lymph nodes of multiple sites: Secondary | ICD-10-CM

## 2017-10-31 DIAGNOSIS — D801 Nonfamilial hypogammaglobulinemia: Secondary | ICD-10-CM

## 2017-10-31 DIAGNOSIS — Z9221 Personal history of antineoplastic chemotherapy: Secondary | ICD-10-CM

## 2017-10-31 LAB — IGG, IGA, IGM
IgA: <5 mg/dL — ABNORMAL LOW (ref 87–352)
IgG (Immunoglobin G), Serum: 1166 mg/dL (ref 700–1600)
IgM (Immunoglobulin M), Srm: 185 mg/dL (ref 26–217)

## 2017-10-31 MED ORDER — DEXTROSE 5 % IV SOLN
INTRAVENOUS | Status: DC
  Administered 2017-10-31: 11:00:00 via INTRAVENOUS

## 2017-10-31 MED ORDER — ACETAMINOPHEN 325 MG PO TABS: 650.0000 mg | ORAL_TABLET | Freq: Four times a day (QID) | ORAL | Status: DC | PRN

## 2017-10-31 MED ORDER — IMMUNE GLOBULIN (HUMAN) 10 GM/100ML IV SOLN
50.0000 g | Freq: Once | INTRAVENOUS | Status: AC
  Administered 2017-10-31: 50 g via INTRAVENOUS
  Filled 2017-10-31: qty 400

## 2017-10-31 MED ORDER — DIPHENHYDRAMINE HCL 25 MG PO CAPS: 25.0000 mg | ORAL_CAPSULE | Freq: Once | ORAL | Status: DC

## 2017-10-31 NOTE — Patient Instructions (Signed)
[facility] at [facility] Discharge Instructions     Thank you for choosing [facility] at [facility] to provide your oncology and hematology care.  To afford each patient quality time with our provider, please arrive at least 15 minutes before your scheduled appointment time.   If you have a lab appointment with the Cancer Center please come in thru the  Main Entrance and check in at the main information desk  You need to re-schedule your appointment should you arrive 10 or more minutes late.  We strive to give you quality time with our providers, and arriving late affects you and other patients whose appointments are after yours.  Also, if you no show three or more times for appointments you may be dismissed from the clinic at the providers discretion.     Again, thank you for choosing  Cancer Center.  Our hope is that these requests will decrease the amount of time that you wait before being seen by our physicians.       _____________________________________________________________  Should you have questions after your visit to  Cancer Center, please contact our office at [phone] between the hours of 8:00 a.m. and 4:30 p.m.  Voicemails left after 4:00 p.m. will not be returned until the following business day.  For prescription refill requests, have your pharmacy contact our office and allow 72 hours.    Cancer Center Support Programs:   > Cancer Support Group  2nd Tuesday of the month 1pm-2pm, Journey Room    

## 2017-10-31 NOTE — Patient Instructions (Signed)
[facility] at [facility]  Discharge Instructions:   _______________________________________________________________  Thank you for choosing [facility] at [facility] to provide your oncology and hematology care.  To afford each patient quality time with our providers, please arrive at least 15 minutes before your scheduled appointment.  You need to re-schedule your appointment if you arrive 10 or more minutes late.  We strive to give you quality time with our providers, and arriving late affects you and other patients whose appointments are after yours.  Also, if you no show three or more times for appointments you may be dismissed from the clinic.  Again, thank you for choosing  Cancer Center at [facility]. Our hope is that these requests will allow you access to exceptional care and in a timely manner. _______________________________________________________________  If you have questions after your visit, please contact our office at [phone] between the hours of 8:30 a.m. and 5:00 p.m. Voicemails left after 4:30 p.m. will not be returned until the following business day. _______________________________________________________________  For prescription refill requests, have your pharmacy contact our office. _______________________________________________________________  Recommendations made by the consultant and any test results will be sent to your referring physician. _______________________________________________________________ 

## 2017-10-31 NOTE — Assessment & Plan Note (Addendum)
1.  Acquired hypogammaglobulinemia: -She was receiving IVIG every 4 weeks.  This was switched to every 6 weeks in July.  She has been tolerating it very well. -Did not have any infections requiring antibiotics in the last 3 to 4 months. -Her IgG trough level was 1166 on 10/30/2017.  She takes 5 mg of prednisone daily for her breathing. -I have recommended further spreading out IVIG infusions to once every 8 weeks.  I will see her back in 2 months with repeat IgG level.  2.  Diffuse large B-cell lymphoma, stage IVb:  -She was treated in 2011.  She was treated with 4 cycles of R CHOP with intrathecal methotrexate during cycle 3 on 4.  She had developed Pseudomonas sepsis after cycles 2 and 4 and therapy was truncated after 4 cycles.  She also received 2 years of maintenance rituximab.  PET CT scan on 03/20/2017 did not show any evidence of lymphoma. -Today's physical examination did not reveal any lymphadenopathy.  She does not have any B symptoms.  3.  Left lung nodule: There is no evidence of left lung nodule on the PET/CT scan from March 2019.  4.  Health maintenance: -She had a mammogram on 09/08/2017 which was BI-RADS 1.

## 2017-10-31 NOTE — Progress Notes (Signed)
[name], [name] [id number]   CLINIC:  Medical Oncology/Hematology  PCP:  Sharilyn Sites, MD [address] [place] [address] [phone]   REASON FOR VISIT: Follow-up for hypogammaglobulinemia AND non-hodgkin's lymphoma  CURRENT THERAPY: IVIG every 6 weeks AND observation  BRIEF ONCOLOGIC HISTORY:    Non Hodgkin's lymphoma ([place])   09/09/2009 Initial Diagnosis    Non Hodgkin's lymphoma ([place])      CANCER STAGING: Cancer Staging Non Hodgkin's lymphoma ([place]) Staging form: Lymphoid Neoplasms, AJCC 6th Edition - Clinical: Stage IV - Signed by Baird Cancer, PA on 11/05/2010    INTERVAL HISTORY:  [name] 62 y.o. female returns for routine follow-up for non-hodgkin's lymphoma. Patient is here today and doing well. She reports fevers due to a sinus infection and chest cold. She is still recovering and is wheezy. She does report occasional mouth sores. She also bruises easy. She reports her appetite at 75% and she is maintaining her weight well. Her energy level is 75%. She denies any skin rashes. Denies any night sweats, chills, or recent weight loss. Denies any headaches. Denies any new pains or lumps felt.    REVIEW OF SYSTEMS:  Review of Systems  Constitutional: Positive for fever.  HENT:   Positive for mouth sores.   Hematological: Bruises/bleeds easily.  All other systems reviewed and are negative.    PAST MEDICAL/SURGICAL HISTORY:  Past Medical History:  Diagnosis Date  . Allergic rhinitis   . Allergy   . Anemia   . Anxiety   . Asthma   . B12 deficiency 01/18/2015  . Blood transfusion without reported diagnosis   . Cataract   . Cavitary lung disease   . Chronic kidney disease    related to cancer  . Chronic sinusitis   . Clotting disorder ([place])   . COPD (chronic obstructive pulmonary disease) ([place])   . Depression   . DVT (deep venous thrombosis) ([place]) 09/25/2014  . Endotracheally intubated   . GERD  (gastroesophageal reflux disease)   . Hematuria 04/07/2016  . Hyperlipidemia   . Hypertension   . Hypogammaglobulinemia, acquired ([place]) 12/30/2009   Qualifier: Diagnosis of  By: Joya Gaskins MD, Burnett Harry   . Iron deficiency anemia 01/18/2015  . Myocardial infarction ([place]) 2011  . 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 Non Hodgkin's lymphoma ([place]) 2011  . Orofacial dyskinesia 04/22/2015  . Orofacial dyskinesia 04/22/2015  . Osteoporosis   . Peripheral neuropathy due to chemotherapy ([place]) 05/02/2016  . Pneumonia 01/2013  . PONV (postoperative nausea and vomiting)   . Respiratory failure ([place])   . Sleep apnea   . SVC syndrome 09/25/2014   Past Surgical History:  Procedure Laterality Date  . ABDOMINAL HYSTERECTOMY     Fibroids  . BASAL CELL CARCINOMA EXCISION  03/2011   scalp  . BREAST SURGERY Right    biopsy  . CATARACT EXTRACTION W/PHACO Left 11/17/2014   Procedure: CATARACT EXTRACTION PHACO AND INTRAOCULAR LENS PLACEMENT LEFT EYE;  Surgeon: Tonny Branch, MD;  Location: [facility];  Service: Ophthalmology;  Laterality: Left;  CDE:5.60  . CATARACT EXTRACTION W/PHACO Right 12/15/2014   Procedure: CATARACT EXTRACTION PHACO AND INTRAOCULAR LENS PLACEMENT RIGHT EYE CDE=5.16;  Surgeon: Tonny Branch, MD;  Location: [facility];  Service: Ophthalmology;  Laterality: Right;  . COLONOSCOPY N/A 11/14/2012   Procedure: COLONOSCOPY;  Surgeon: Rogene Houston, MD;  Location: [facility];  Service: Endoscopy;  Laterality: N/A;  830  . EYE SURGERY    .  NASAL SINUS SURGERY    . NECK SURGERY     x 2   . PERIPHERALLY INSERTED CENTRAL CATHETER INSERTION    . PICC Removal    . PORT-A-CATH REMOVAL    . PORTACATH PLACEMENT  7/11   Removed 6/12  . TRACHEOSTOMY    . VESICOVAGINAL FISTULA CLOSURE W/ TAH       SOCIAL HISTORY:  Social History   Socioeconomic History  . Marital status: Divorced    Spouse name: Not on file  . Number of children: 3  . Years of education: 44  . Highest education level: Not  on file  Occupational History  . Occupation: Estate agent    Comment: [employer]  . Financial resource strain: Not hard at all  . Food insecurity:    Worry: Never true    Inability: Never true  . Transportation needs:    Medical: No    Non-medical: No  Tobacco Use  . Smoking status: Never Smoker  . Smokeless tobacco: Never Used  Substance and Sexual Activity  . Alcohol use: No  . Drug use: No  . Sexual activity: Never    Birth control/protection: Surgical    Comment: hyst  Lifestyle  . Physical activity:    Days per week: 0 days    Minutes per session: 0 min  . Stress: Not at all  Relationships  . Social connections:    Talks on phone: More than three times a week    Gets together: More than three times a week    Attends religious service: More than 4 times per year    Active member of club or organization: Yes    Attends meetings of clubs or organizations: More than 4 times per year    Relationship status: Divorced  . Intimate partner violence:    Fear of current or ex partner: No    Emotionally abused: No    Physically abused: No    Forced sexual activity: No  Other Topics Concern  . Not on file  Social History Narrative   Originally from [place]. Always lived in [place]. Prior travel to [place]. 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 She has 1 dog currently. She has 1 conures (small parrots). No mold exposure in her home. 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 No hot tub exposure. She enjoys sewing & quilting.     FAMILY HISTORY:  Family History  Problem Relation Age of Onset  . Emphysema Mother   . Stroke Mother   . COPD Mother   . Heart disease Mother        died in sleep  87  . Allergies Father   . Asthma Father        as a child  . Arthritis Father   . Parkinson's disease Father 51  . Leukemia Maternal Grandmother   . Cancer Maternal Grandmother        Leukemia  . Diabetes  Brother   . Heart attack Brother   . Hypertension Brother   . Heart disease Brother 40       stents  . Hypertension Sister   . Hypertension Brother     CURRENT MEDICATIONS:  Outpatient Encounter Medications as of 10/31/2017  Medication Sig Note  . acetaminophen (TYLENOL) 500 MG tablet Take 1,000 mg by mouth every 6 (six) hours as needed for pain or fever.    [name] Kitchen  acyclovir (ZOVIRAX) 400 MG tablet Take 1 tablet (400 mg total) by mouth 2 (two) times daily.   [name] Kitchen albuterol (PROAIR HFA) 108 (90 Base) MCG/ACT inhaler Inhale 2 puffs into the lungs every 6 (six) hours as needed.   [name] Kitchen albuterol (PROVENTIL) (2.5 MG/3ML) 0.083% nebulizer solution Take 2.5 mg by nebulization every 4 (four) hours as needed for wheezing or shortness of breath.   [name] Kitchen alendronate (FOSAMAX) 70 MG tablet Take 1 tablet (70 mg total) by mouth once a week. Take with a full glass of water on an empty stomach.   [name] Kitchen amphetamine-dextroamphetamine (ADDERALL) 10 MG tablet Take 10 mg by mouth daily. 04/22/2015: Received from: External Pharmacy Received Sig:   . Armodafinil (NUVIGIL) 150 MG tablet Take 150 mg by mouth daily.   . budesonide-formoterol (SYMBICORT) 160-4.5 MCG/ACT inhaler Inhale 2 puffs into the lungs 2 (two) times daily.   . citalopram (CELEXA) 20 MG tablet Take 20 mg by mouth every morning.     . cyanocobalamin 1000 MCG tablet Take 1,000 mcg by mouth daily.   . fluticasone (FLONASE) 50 MCG/ACT nasal spray Place 2 sprays into both nostrils daily.    . furosemide (LASIX) 20 MG tablet Take 1 tablet (20 mg total) by mouth as needed for fluid.   [name] Kitchen gabapentin (NEURONTIN) 600 MG tablet Take 300 mg by mouth 3 (three) times daily.    [name] Kitchen omeprazole (PRILOSEC) 20 MG capsule TAKE ONE CAPSULE BY MOUTH TWICE DAILY BEFORE A MEAL.   [name] Kitchen Potassium Chloride 40 MEQ/15ML (20%) SOLN Take 20 mEq by mouth 2 (two) times daily. 20 mEq equals 7.5 mL   . predniSONE (DELTASONE) 5 MG tablet Take 1 tablet (5 mg total) by mouth daily with breakfast.   .  simvastatin (ZOCOR) 80 MG tablet Take 1 tablet (80 mg total) by mouth at bedtime.   [name] Kitchen tiotropium (SPIRIVA) 18 MCG inhalation capsule Place 1 capsule (18 mcg total) into inhaler and inhale daily.   . zafirlukast (ACCOLATE) 20 MG tablet Take 1 tablet (20 mg total) by mouth 2 (two) times daily.    Facility-Administered Encounter Medications as of 10/31/2017  Medication  . 0.9 %  sodium chloride infusion  . 0.9 %  sodium chloride infusion  . acetaminophen (TYLENOL) tablet 650 mg  . acetaminophen (TYLENOL) tablet 650 mg  . dextrose 5 % solution  . dextrose 5 % solution  . dextrose 5 % solution  . diphenhydrAMINE (BENADRYL) capsule 25 mg  . diphenhydrAMINE (BENADRYL) capsule 25 mg  . sodium chloride 0.9 % injection 10 mL    ALLERGIES:  Allergies  Allergen Reactions  . Meperidine Hcl Anaphylaxis  . Montelukast Sodium Hives and Rash     PHYSICAL EXAM:  ECOG Performance status: 1  Vitals:   10/31/17 0953 10/31/17 1009  BP: (!) 169/78 (!) 164/78  Pulse: 64 64  Resp: 18 18  Temp: 98.2 F (36.8 C) 98.2 F (36.8 C)  SpO2: 98% 98%   Filed Weights   10/31/17 0953 10/31/17 1009  Weight: 142 lb 8 oz (64.6 kg) 142 lb 8 oz (64.6 kg)    Physical Exam  Constitutional: She is oriented to person, place, and time. She appears well-developed and well-nourished.  Cardiovascular: Normal rate, regular rhythm and normal heart sounds.  Pulmonary/Chest: Effort normal. She has wheezes.  Musculoskeletal: Normal range of motion.  Neurological: She is alert and oriented to person, place, and time.  Skin: Skin is warm and dry.  Psychiatric: She has a normal mood and affect. Her  behavior is normal. Judgment and thought content normal.  Abdomen: Soft nontender with no palpable organomegaly.  No palpable adenopathy.   LABORATORY DATA:  I have reviewed the labs as listed.  CBC    Component Value Date/Time   WBC 12.2 (H) 10/30/2017 1214   RBC 4.28 10/30/2017 1214   HGB 12.1 10/30/2017 1214    HCT 39.8 10/30/2017 1214   PLT 203 10/30/2017 1214   MCV 93.0 10/30/2017 1214   MCH 28.3 10/30/2017 1214   MCHC 30.4 10/30/2017 1214   RDW 13.5 10/30/2017 1214   LYMPHSABS 1.1 10/30/2017 1214   MONOABS 0.4 10/30/2017 1214   EOSABS 0.0 10/30/2017 1214   BASOSABS 0.0 10/30/2017 1214   CMP Latest Ref Rng & Units 10/30/2017 09/19/2017 08/08/2017  Glucose 70 - 99 mg/dL 118(H) 110(H) 88  BUN 8 - 23 mg/dL 11 11 9   Creatinine 0.44 - 1.00 mg/dL 0.88 0.96 0.94  Sodium 135 - 145 mmol/L 138 140 139  Potassium 3.5 - 5.1 mmol/L 3.9 3.5 3.7  Chloride 98 - 111 mmol/L 99 102 103  CO2 22 - 32 mmol/L 29 27 30   Calcium 8.9 - 10.3 mg/dL 8.8(L) 8.7(L) 8.8(L)  Total Protein 6.5 - 8.1 g/dL 7.2 7.0 6.9  Total Bilirubin 0.3 - 1.2 mg/dL 0.7 0.9 0.6  Alkaline Phos 38 - 126 U/L 92 89 87  AST 15 - 41 U/L 16 21 18   ALT 0 - 44 U/L 11 14 11        DIAGNOSTIC IMAGING:  I have reviewed her mammogram dated 09/08/2017 and discussed with the patient.     ASSESSMENT & PLAN:   Non Hodgkin's lymphoma ([place]) 1.  Acquired hypogammaglobulinemia: -She was receiving IVIG every 4 weeks.  This was switched to every 6 weeks in July.  She has been tolerating it very well. -Did not have any infections requiring antibiotics in the last 3 to 4 months. -Her IgG trough level was 1166 on 10/30/2017.  She takes 5 mg of prednisone daily for her breathing. -I have recommended further spreading out IVIG infusions to once every 8 weeks.  I will see her back in 2 months with repeat IgG level.  2.  Diffuse large B-cell lymphoma, stage IVb:  -She was treated in 2011.  She was treated with 4 cycles of R CHOP with intrathecal methotrexate during cycle 3 on 4.  She had developed Pseudomonas sepsis after cycles 2 and 4 and therapy was truncated after 4 cycles.  She also received 2 years of maintenance rituximab.  PET CT scan on 03/20/2017 did not show any evidence of lymphoma. -Today's physical examination did not reveal any lymphadenopathy.   She does not have any B symptoms.  3.  Left lung nodule: There is no evidence of left lung nodule on the PET/CT scan from March 2019.  4.  Health maintenance: -She had a mammogram on 09/08/2017 which was BI-RADS 1.      Orders placed this encounter:  Orders Placed This Encounter  Procedures  . CBC with Differential/Platelet  . Comprehensive metabolic panel  . Lactate dehydrogenase  . IgG, IgA, IgM      Derek Jack, MD [facility] [phone]

## 2017-10-31 NOTE — Progress Notes (Signed)
Patient tolerated IVIG with no complaints voiced.  Blood return noted before and after administration of therapy.  Band aid applied.  VSs with discharge and left ambulatory with no s/s of distress noted.

## 2017-11-01 ENCOUNTER — Other Ambulatory Visit: Payer: Self-pay | Admitting: Adult Health

## 2017-11-09 ENCOUNTER — Other Ambulatory Visit (HOSPITAL_COMMUNITY): Payer: Medicare Other

## 2017-11-09 ENCOUNTER — Ambulatory Visit (HOSPITAL_COMMUNITY): Payer: Medicare Other

## 2017-11-10 DIAGNOSIS — M79674 Pain in right toe(s): Secondary | ICD-10-CM | POA: Diagnosis not present

## 2017-11-10 DIAGNOSIS — L600 Ingrowing nail: Secondary | ICD-10-CM | POA: Diagnosis not present

## 2017-11-24 DIAGNOSIS — L600 Ingrowing nail: Secondary | ICD-10-CM | POA: Diagnosis not present

## 2017-12-06 ENCOUNTER — Encounter: Payer: Self-pay | Admitting: Adult Health

## 2017-12-06 ENCOUNTER — Ambulatory Visit (INDEPENDENT_AMBULATORY_CARE_PROVIDER_SITE_OTHER): Payer: Medicare Other | Admitting: Adult Health

## 2017-12-06 ENCOUNTER — Other Ambulatory Visit: Payer: Medicare Other | Admitting: Adult Health

## 2017-12-06 VITALS — BP 146/88 | HR 69 | Ht 60.0 in | Wt 140.5 lb

## 2017-12-06 DIAGNOSIS — Z1212 Encounter for screening for malignant neoplasm of rectum: Secondary | ICD-10-CM

## 2017-12-06 DIAGNOSIS — N952 Postmenopausal atrophic vaginitis: Secondary | ICD-10-CM

## 2017-12-06 DIAGNOSIS — Z1211 Encounter for screening for malignant neoplasm of colon: Secondary | ICD-10-CM | POA: Diagnosis not present

## 2017-12-06 DIAGNOSIS — Z01419 Encounter for gynecological examination (general) (routine) without abnormal findings: Secondary | ICD-10-CM

## 2017-12-06 LAB — HEMOCCULT GUIAC POC 1CARD (OFFICE): FECAL OCCULT BLD: NEGATIVE

## 2017-12-06 NOTE — Progress Notes (Signed)
  Subjective:     Patient ID: [name], female   DOB: 12/14/1955, 62 y.o.   MRN: [id number]  HPI [name] is a 62 year old white female, divorced in for pelvic exam, she is sp hysterectomy.She is working PT at [employer]. PCP is Dr Hilma Favors.  Review of Systems  She has recently had frequent urination after going to bed Denies any burning with urination or UI  She is not sexually active,she says it has dried up No problems with BM  Reviewed past medical,surgical, social and family history. Reviewed medications and allergies.     Objective:   Physical Exam BP (!) 146/88 (BP Location: Right Arm, Patient Position: Sitting, Cuff Size: Normal)   Pulse 69   Ht 5' (1.524 m)   Wt 140 lb 8 oz (63.7 kg)   BMI 27.44 kg/m   Fall risk is moderate PHQ 2 score 0. Skin warm and dry.Has varicosities lower abdomen. Pelvic: external genitalia is normal in appearance no lesions, vagina: pale,and dry with loss of rugae, atrophic,urethra has no lesions or masses noted, cervix and uterus are absent, adnexa: no masses or tenderness noted. Bladder is non tender and no masses felt.   On rectal exam, has good tone, no masses, has +rectocle, and hemoccult is negative. Examination chaperoned by Levy Pupa LPN.  Assessment:     1. Visit for pelvic exam   2. Vaginal atrophy   3. Screening for colorectal cancer       Plan:     Pelvic in 2 years Physical with PCP Mammogram yearly Labs at Cancer center

## 2017-12-12 ENCOUNTER — Other Ambulatory Visit: Payer: Self-pay | Admitting: Adult Health

## 2017-12-13 ENCOUNTER — Other Ambulatory Visit (INDEPENDENT_AMBULATORY_CARE_PROVIDER_SITE_OTHER): Payer: Self-pay | Admitting: *Deleted

## 2017-12-13 ENCOUNTER — Encounter (INDEPENDENT_AMBULATORY_CARE_PROVIDER_SITE_OTHER): Payer: Self-pay | Admitting: *Deleted

## 2017-12-13 ENCOUNTER — Telehealth (INDEPENDENT_AMBULATORY_CARE_PROVIDER_SITE_OTHER): Payer: Self-pay | Admitting: *Deleted

## 2017-12-13 DIAGNOSIS — Z860100 Personal history of colon polyps, unspecified: Secondary | ICD-10-CM | POA: Insufficient documentation

## 2017-12-13 DIAGNOSIS — E119 Type 2 diabetes mellitus without complications: Secondary | ICD-10-CM | POA: Diagnosis not present

## 2017-12-13 DIAGNOSIS — Z6824 Body mass index (BMI) 24.0-24.9, adult: Secondary | ICD-10-CM | POA: Diagnosis not present

## 2017-12-13 DIAGNOSIS — Z1389 Encounter for screening for other disorder: Secondary | ICD-10-CM | POA: Diagnosis not present

## 2017-12-13 DIAGNOSIS — J069 Acute upper respiratory infection, unspecified: Secondary | ICD-10-CM | POA: Diagnosis not present

## 2017-12-13 MED ORDER — PEG 3350-KCL-NA BICARB-NACL 420 G PO SOLR: 4000.0000 mL | Freq: Once | ORAL | 0 refills | Status: AC

## 2017-12-13 NOTE — Telephone Encounter (Signed)
agree

## 2017-12-13 NOTE — Telephone Encounter (Signed)
Referring MD/PCP: golding   Procedure: tcs  Reason/Indication:  Hx polyps  Has patient had this procedure before?  Yes, 2014  If so, when, by whom and where?    Is there a family history of colon cancer?  no  Who?  What age when diagnosed?    Is patient diabetic?   no      Does patient have prosthetic heart valve or mechanical valve?  no  Do you have a pacemaker?  no  Has patient ever had endocarditis? no  Has patient had joint replacement within last 12 months?  no  Is patient constipated or do they take laxatives? no  Does patient have a history of alcohol/drug use?  no  Is patient on blood thinner such as Coumadin, Plavix and/or Aspirin? no  Medications: spiriva 18 mch daily, prednisone 5 mg daily omeprazole 20 mg daily, lasix 20 mg daily prn, citalopram 20 mg daily, simvastatin 80 mg daily, albuterol every 6 hrs prn, fluticasone daily, pro air prn, nuvigil 150 mg daily, Neurontin 300 mg qid, adderall 10 mg daily, accolate 20 mg bid, acyclovir 200 mg 2 tabs bid   Allergies: demerol, singulair  Medication Adjustment per Dr Lindi Adie, NP:   Procedure date & time: 01/12/18 at 730  preop 1/2 at 245

## 2017-12-13 NOTE — Telephone Encounter (Signed)
Patient needs trilyte 

## 2017-12-19 IMAGING — MG DIGITAL SCREENING BILATERAL MAMMOGRAM WITH CAD
4 series · 4 of 4 positions shown · non-contrast
Comparison: Previous exam(s).

ACR Breast Density Category a: The breast tissue is almost entirely
fatty.

CLINICAL DATA: Screening.

EXAM:
DIGITAL SCREENING BILATERAL MAMMOGRAM WITH CAD

[L MLO]
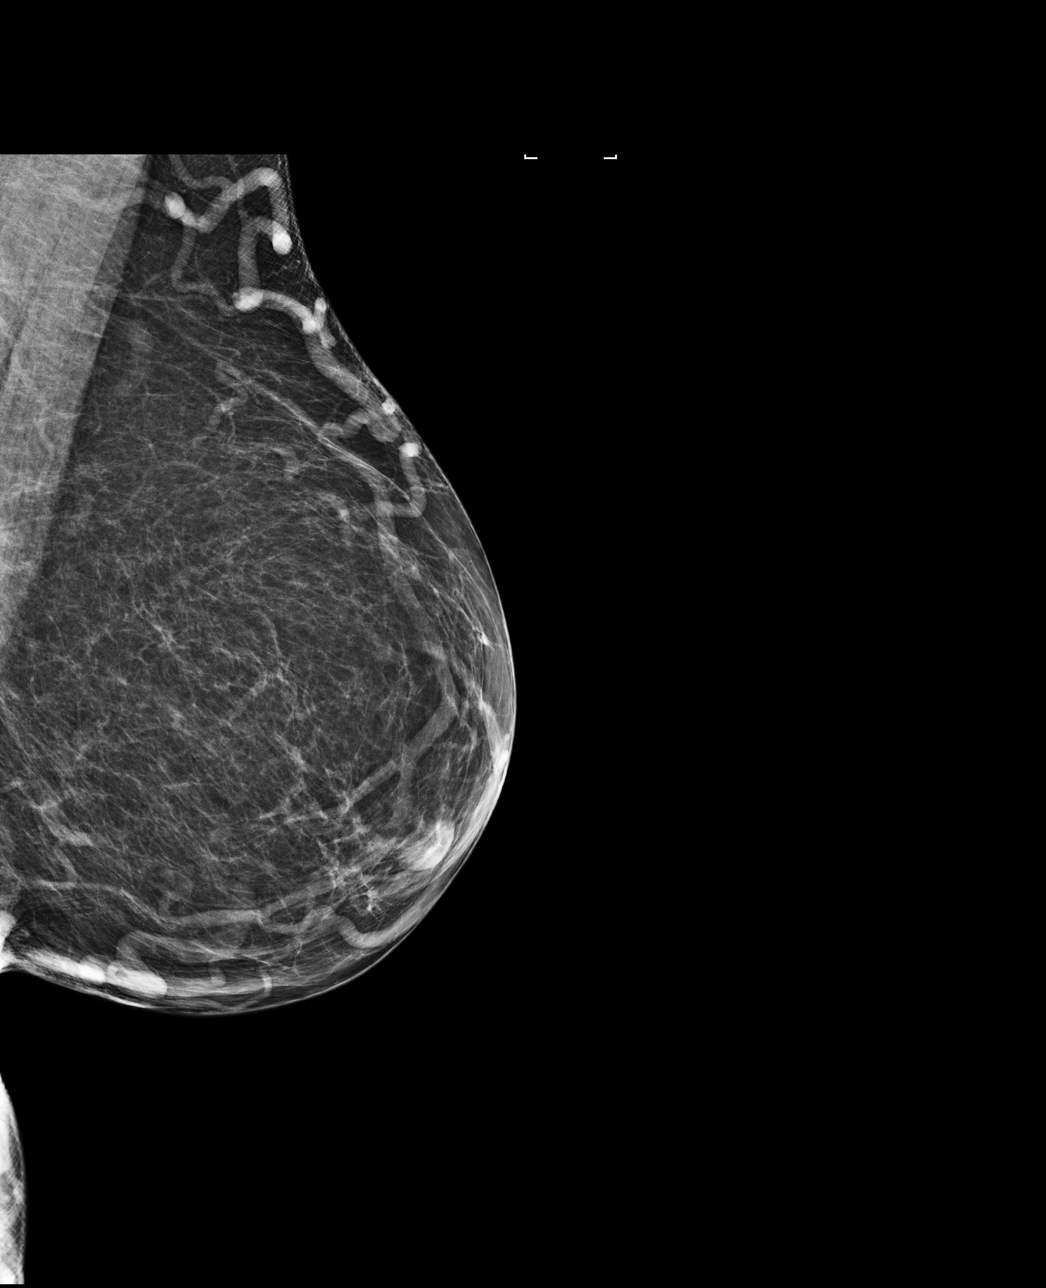

[R CC]
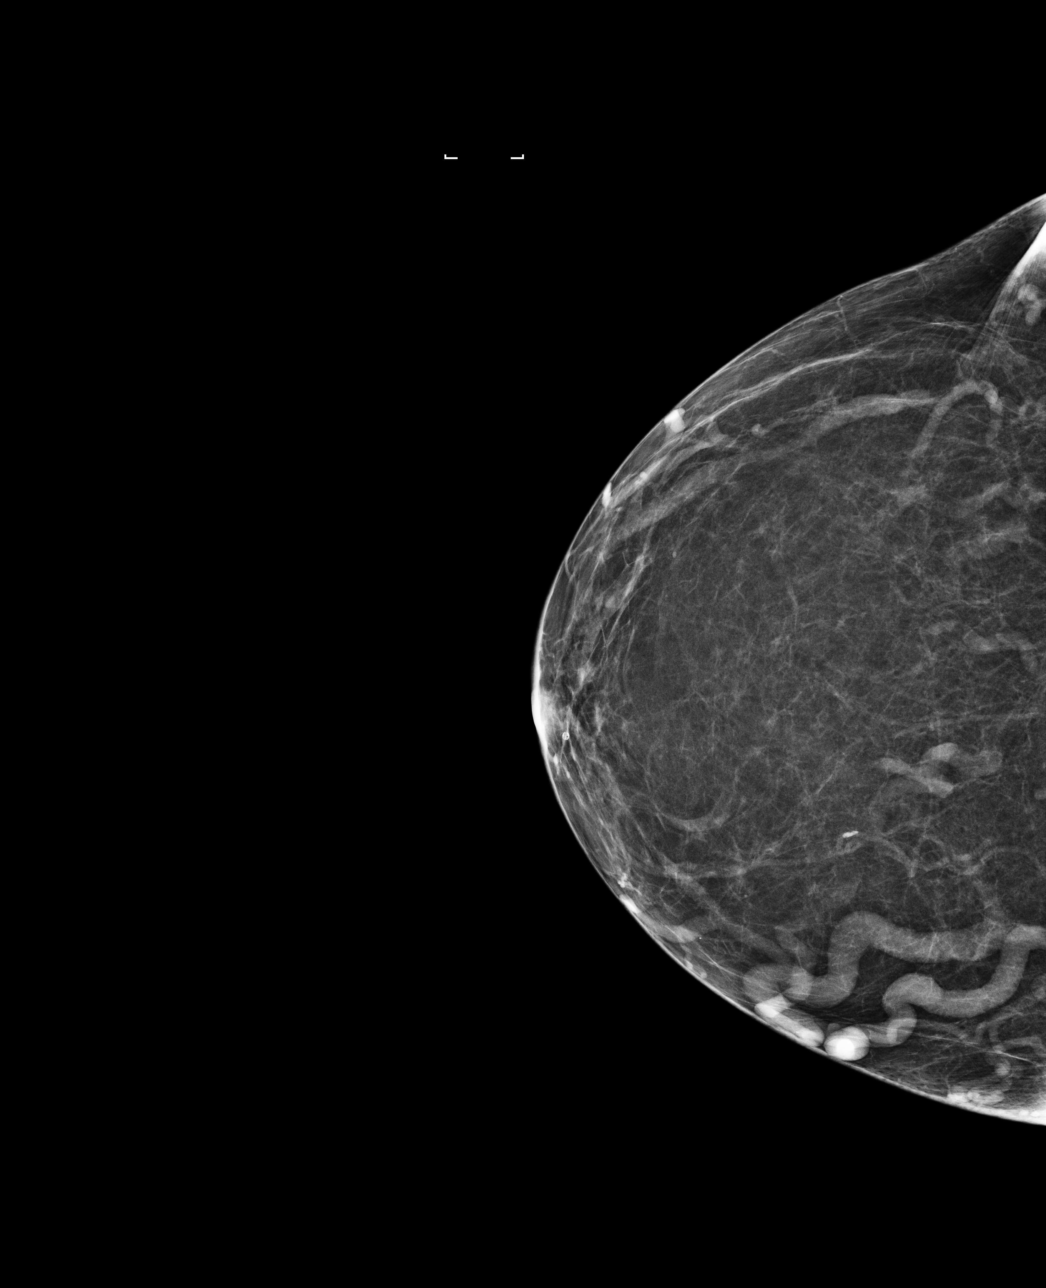

[L CC]
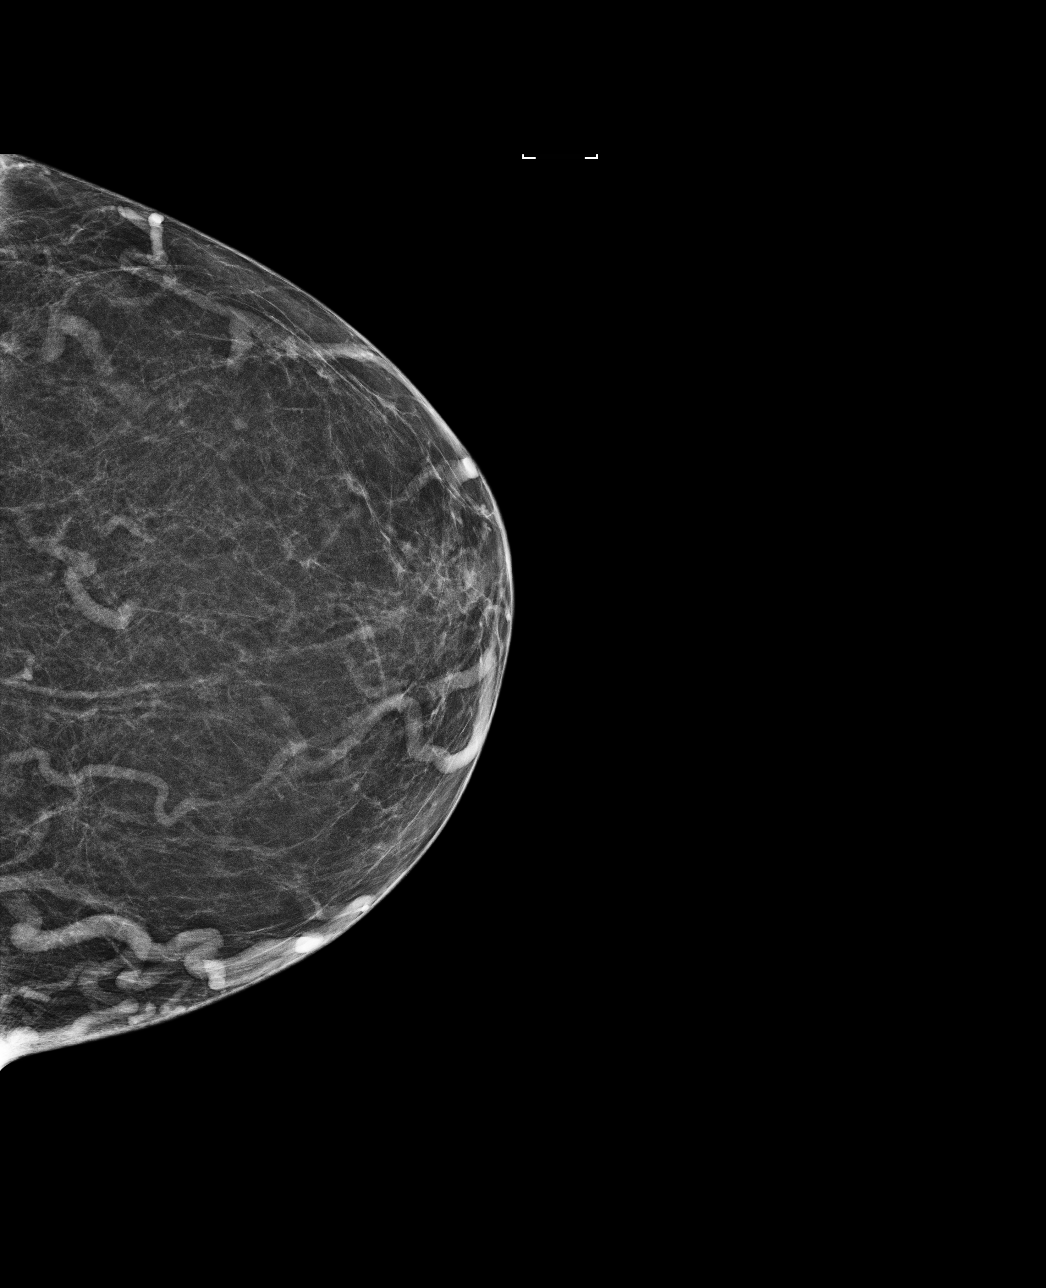

[R MLO]
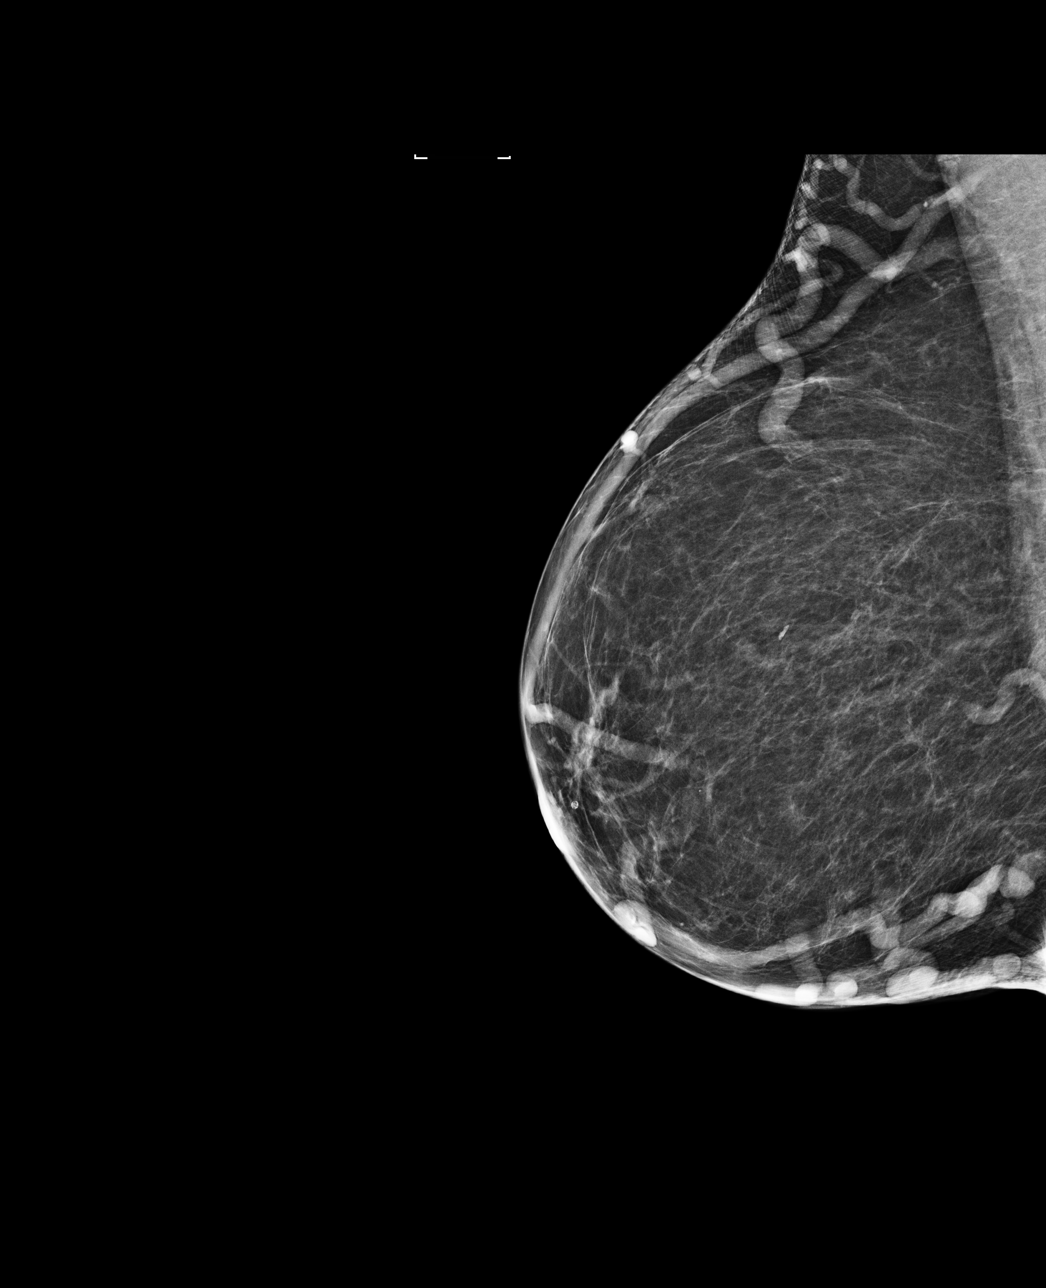

[4 of 4 positions shown; findings below may reference images not displayed]

FINDINGS: There are no findings suspicious for malignancy. Images were
processed with CAD.
IMPRESSION: No mammographic evidence of malignancy. A result letter of this
screening mammogram will be mailed directly to the patient.

RECOMMENDATION:
Screening mammogram in one year. (Code:[id number])

BI-RADS CATEGORY  1: Negative.

## 2017-12-20 DIAGNOSIS — G4733 Obstructive sleep apnea (adult) (pediatric): Secondary | ICD-10-CM | POA: Diagnosis not present

## 2017-12-20 DIAGNOSIS — E1140 Type 2 diabetes mellitus with diabetic neuropathy, unspecified: Secondary | ICD-10-CM | POA: Diagnosis not present

## 2017-12-20 DIAGNOSIS — G47419 Narcolepsy without cataplexy: Secondary | ICD-10-CM | POA: Diagnosis not present

## 2017-12-20 DIAGNOSIS — Z79899 Other long term (current) drug therapy: Secondary | ICD-10-CM | POA: Diagnosis not present

## 2017-12-25 ENCOUNTER — Other Ambulatory Visit (HOSPITAL_COMMUNITY): Payer: Medicare Other

## 2017-12-25 NOTE — Patient Instructions (Signed)
[name]  12/25/2017     @PREFPERIOPPHARMACY @   Your procedure is scheduled on  01/12/2018   Report to [name] at  615   A.M.  Call this number if you have problems the morning of surgery:  [phone]   Remember:  Follow the diet and prep instructions given to you by Dr Olevia Perches office.                       Take these medicines the morning of surgery with A SIP OF WATER adderall, celexa, gabapentin, prilosec, zafirlukast. Use your nebulizer and your inhaler before you come.    Do not wear jewelry, make-up or nail polish.  Do not wear lotions, powders, or perfumes, or deodorant.  Do not shave 48 hours prior to surgery.  Men may shave face and neck.  Do not bring valuables to the hospital.  [facility] is not responsible for any belongings or valuables.  Contacts, dentures or bridgework may not be worn into surgery.  Leave your suitcase in the car.  After surgery it may be brought to your room.  For patients admitted to the hospital, discharge time will be determined by your treatment team.  Patients discharged the day of surgery will not be allowed to drive home.   Name and phone number of your driver:   family Special instructions:  None  Please read over the following fact sheets that you were given. Anesthesia Post-op Instructions and Care and Recovery After Surgery       Colonoscopy, Adult A colonoscopy is an exam to look at the large intestine. It is done to check for problems, such as:  Lumps (tumors).  Growths (polyps).  Swelling (inflammation).  Bleeding. What happens before the procedure? Eating and drinking Follow instructions from your doctor about eating and drinking. These instructions may include:  A few days before the procedure - follow a low-fiber diet. ? Avoid nuts. ? Avoid seeds. ? Avoid dried fruit. ? Avoid raw fruits. ? Avoid vegetables.  1-3 days before the procedure - follow a clear liquid diet. Avoid  liquids that have red or purple dye. Drink only clear liquids, such as: ? Clear broth or bouillon. ? Black coffee or tea. ? Clear juice. ? Clear soft drinks or sports drinks. ? Gelatin dessert. ? Popsicles.  On the day of the procedure - do not eat or drink anything during the 2 hours before the procedure. Up to 2 hours before the procedure, you may continue to drink clear liquids, such as water or clear fruit juice.  Bowel prep If you were prescribed an oral bowel prep:  Take it as told by your doctor. Starting the day before your procedure, you will need to drink a lot of liquid. The liquid will cause you to poop (have bowel movements) until your poop is almost clear or light green.  To clean out your colon, you may also be given: ? Laxative medicines. ? Instructions about how to use an enema.  If your skin or butt gets irritated from diarrhea, you may: ? Wipe the area with wipes that have medicine in them, such as adult wet wipes with aloe and vitamin E. ? Put something on your skin that soothes the area, such as petroleum jelly.  If you throw up (vomit) while drinking the bowel prep, take a break for up to 60 minutes. Then begin the bowel prep  again. If you keep throwing up and you cannot take the bowel prep without throwing up, call your doctor. General instructions  Ask your doctor about: ? Changing or stopping your normal medicines. This is important if you take iron pills, diabetes medicines, or blood thinners. ? Taking medicines such as aspirin and ibuprofen. These medicines can thin your blood. Do not take these medicines unless your doctor tells you to take them.  Plan to have someone take you home from the hospital or clinic. What happens during the procedure?   An IV tube may be put into one of your veins.  You will be given medicine to help you relax (sedative).  To reduce your risk of infection: ? Your doctors will wash their hands. ? Your anal area will be  washed with soap.  You will be asked to lie on your side with your knees bent.  Your doctor will get a long, thin, flexible tube ready. The tube will have a camera and a light on the end.  The tube will be put into your anus.  The tube will be gently put into your large intestine.  Air will be delivered into your large intestine to keep it open. You may feel some pressure or cramping.  The camera will be used to take photos.  A small tissue sample may be removed for testing (biopsy).  If small growths are found, your doctor may remove them and have them checked for cancer.  The tube that was put into your anus will be slowly removed. The procedure may vary among doctors and hospitals. What happens after the procedure?  Your doctor will check on you often until the medicines you were given have worn off.  Do not drive for 24 hours after the procedure.  You may have a small amount of blood in your poop.  You may pass gas.  You may have mild cramps or bloating in your belly (abdomen).  It is up to you to get the results of your procedure. Ask your doctor, or the department performing the procedure, when your results will be ready. Summary  A colonoscopy is an exam to look at the large intestine.  Follow instructions from your doctor about eating and drinking before the procedure.  If you were prescribed an oral bowel prep to clean out your colon, take it as told by your doctor.  Your doctor will check on you often until the medicines you were given have worn off.  Plan to have someone take you home from the hospital or clinic. This information is not intended to replace advice given to you by your health care provider. Make sure you discuss any questions you have with your health care provider. Document Released: 01/22/2010 Document Revised: 10/19/2016 Document Reviewed: 03/03/2015 Elsevier Interactive Patient Education  2019 [employer]  Colonoscopy, Adult, Care  After This sheet gives you information about how to care for yourself after your procedure. Your health care provider may also give you more specific instructions. If you have problems or questions, contact your health care provider. What can I expect after the procedure? After the procedure, it is common to have:  A small amount of blood in your stool for 24 hours after the procedure.  Some gas.  Mild abdominal cramping or bloating. Follow these instructions at home: General instructions  For the first 24 hours after the procedure: ? Do not drive or use machinery. ? Do not sign important documents. ? Do not drink  alcohol. ? Do your regular daily activities at a slower pace than normal. ? Eat soft, easy-to-digest foods.  Take over-the-counter or prescription medicines only as told by your health care provider. Relieving cramping and bloating   Try walking around when you have cramps or feel bloated.  Apply heat to your abdomen as told by your health care provider. Use a heat source that your health care provider recommends, such as a moist heat pack or a heating pad. ? Place a towel between your skin and the heat source. ? Leave the heat on for 20-30 minutes. ? Remove the heat if your skin turns bright red. This is especially important if you are unable to feel pain, heat, or cold. You may have a greater risk of getting burned. Eating and drinking   Drink enough fluid to keep your urine pale yellow.  Resume your normal diet as instructed by your health care provider. Avoid heavy or fried foods that are hard to digest.  Avoid drinking alcohol for as long as instructed by your health care provider. Contact a health care provider if:  You have blood in your stool 2-3 days after the procedure. Get help right away if:  You have more than a small spotting of blood in your stool.  You pass large blood clots in your stool.  Your abdomen is swollen.  You have nausea or  vomiting.  You have a fever.  You have increasing abdominal pain that is not relieved with medicine. Summary  After the procedure, it is common to have a small amount of blood in your stool. You may also have mild abdominal cramping and bloating.  For the first 24 hours after the procedure, do not drive or use machinery, sign important documents, or drink alcohol.  Contact your health care provider if you have a lot of blood in your stool, nausea or vomiting, a fever, or increased abdominal pain. This information is not intended to replace advice given to you by your health care provider. Make sure you discuss any questions you have with your health care provider. Document Released: 08/04/2003 Document Revised: 10/12/2016 Document Reviewed: 03/03/2015 Elsevier Interactive Patient Education  2019 [employer] [place] Anesthesia is a term that refers to techniques, procedures, and medicines that help a person stay safe and comfortable during a medical procedure. Monitored anesthesia care, or sedation, is one type of anesthesia. Your anesthesia specialist may recommend sedation if you will be having a procedure that does not require you to be unconscious, such as:  Cataract surgery.  A dental procedure.  A biopsy.  A colonoscopy. During the procedure, you may receive a medicine to help you relax (sedative). There are three levels of sedation:  Mild sedation. At this level, you may feel awake and relaxed. You will be able to follow directions.  Moderate sedation. At this level, you will be sleepy. You may not remember the procedure.  Deep sedation. At this level, you will be asleep. You will not remember the procedure. The more medicine you are given, the deeper your level of sedation will be. Depending on how you respond to the procedure, the anesthesia specialist may change your level of sedation or the type of anesthesia to fit your needs. An anesthesia specialist  will monitor you closely during the procedure. Let your health care provider know about:  Any allergies you have.  All medicines you are taking, including vitamins, herbs, eye drops, creams, and over-the-counter medicines.  Any use  of steroids (by mouth or as a cream).  Any problems you or family members have had with sedatives and anesthetic medicines.  Any blood disorders you have.  Any surgeries you have had.  Any medical conditions you have, such as sleep apnea.  Whether you are pregnant or may be pregnant.  Any use of cigarettes, alcohol, or street drugs. What are the risks? Generally, this is a safe procedure. However, problems may occur, including:  Getting too much medicine (oversedation).  Nausea.  Allergic reaction to medicines.  Trouble breathing. If this happens, a breathing tube may be used to help with breathing. It will be removed when you are awake and breathing on your own.  Heart trouble.  Lung trouble. Before the procedure Staying hydrated Follow instructions from your health care provider about hydration, which may include:  Up to 2 hours before the procedure - you may continue to drink clear liquids, such as water, clear fruit juice, black coffee, and plain tea. Eating and drinking restrictions Follow instructions from your health care provider about eating and drinking, which may include:  8 hours before the procedure - stop eating heavy meals or foods such as meat, fried foods, or fatty foods.  6 hours before the procedure - stop eating light meals or foods, such as toast or cereal.  6 hours before the procedure - stop drinking milk or drinks that contain milk.  2 hours before the procedure - stop drinking clear liquids. Medicines Ask your health care provider about:  Changing or stopping your regular medicines. This is especially important if you are taking diabetes medicines or blood thinners.  Taking medicines such as aspirin and  ibuprofen. These medicines can thin your blood. Do not take these medicines before your procedure if your health care provider instructs you not to. Tests and exams  You will have a physical exam.  You may have blood tests done to show: ? How well your kidneys and liver are working. ? How well your blood can clot. General instructions  Plan to have someone take you home from the hospital or clinic.  If you will be going home right after the procedure, plan to have someone with you for 24 hours.  What happens during the procedure?  Your blood pressure, heart rate, breathing, level of pain and overall condition will be monitored.  An IV tube will be inserted into one of your veins.  Your anesthesia specialist will give you medicines as needed to keep you comfortable during the procedure. This may mean changing the level of sedation.  The procedure will be performed. After the procedure  Your blood pressure, heart rate, breathing rate, and blood oxygen level will be monitored until the medicines you were given have worn off.  Do not drive for 24 hours if you received a sedative.  You may: ? Feel sleepy, clumsy, or nauseous. ? Feel forgetful about what happened after the procedure. ? Have a sore throat if you had a breathing tube during the procedure. ? Vomit. This information is not intended to replace advice given to you by your health care provider. Make sure you discuss any questions you have with your health care provider. Document Released: 09/15/2004 Document Revised: 05/29/2015 Document Reviewed: 04/12/2015 Elsevier Interactive Patient Education  2019 [place], Care After These instructions provide you with information about caring for yourself after your procedure. Your health care provider may also give you more specific instructions. Your treatment has been planned according  to current medical practices, but problems sometimes occur. Call  your health care provider if you have any problems or questions after your procedure. What can I expect after the procedure? After your procedure, you may:  Feel sleepy for several hours.  Feel clumsy and have poor balance for several hours.  Feel forgetful about what happened after the procedure.  Have poor judgment for several hours.  Feel nauseous or vomit.  Have a sore throat if you had a breathing tube during the procedure. Follow these instructions at home: For at least 24 hours after the procedure:      Have a responsible adult stay with you. It is important to have someone help care for you until you are awake and alert.  Rest as needed.  Do not: ? Participate in activities in which you could fall or become injured. ? Drive. ? Use heavy machinery. ? Drink alcohol. ? Take sleeping pills or medicines that cause drowsiness. ? Make important decisions or sign legal documents. ? Take care of children on your own. Eating and drinking  Follow the diet that is recommended by your health care provider.  If you vomit, drink water, juice, or soup when you can drink without vomiting.  Make sure you have little or no nausea before eating solid foods. General instructions  Take over-the-counter and prescription medicines only as told by your health care provider.  If you have sleep apnea, surgery and certain medicines can increase your risk for breathing problems. Follow instructions from your health care provider about wearing your sleep device: ? Anytime you are sleeping, including during daytime naps. ? While taking prescription pain medicines, sleeping medicines, or medicines that make you drowsy.  If you smoke, do not smoke without supervision.  Keep all follow-up visits as told by your health care provider. This is important. Contact a health care provider if:  You keep feeling nauseous or you keep vomiting.  You feel light-headed.  You develop a rash.  You  have a fever. Get help right away if:  You have trouble breathing. Summary  For several hours after your procedure, you may feel sleepy and have poor judgment.  Have a responsible adult stay with you for at least 24 hours or until you are awake and alert. This information is not intended to replace advice given to you by your health care provider. Make sure you discuss any questions you have with your health care provider. Document Released: 04/12/2015 Document Revised: 08/05/2016 Document Reviewed: 04/12/2015 Elsevier Interactive Patient Education  2019 [employer].

## 2018-01-01 ENCOUNTER — Ambulatory Visit (HOSPITAL_COMMUNITY): Payer: Medicare Other

## 2018-01-01 ENCOUNTER — Ambulatory Visit (HOSPITAL_COMMUNITY): Payer: Medicare Other | Admitting: Hematology

## 2018-01-02 ENCOUNTER — Inpatient Hospital Stay (HOSPITAL_COMMUNITY): Payer: Medicare Other | Attending: Hematology

## 2018-01-02 DIAGNOSIS — Z79899 Other long term (current) drug therapy: Secondary | ICD-10-CM | POA: Insufficient documentation

## 2018-01-02 DIAGNOSIS — Z8572 Personal history of non-Hodgkin lymphomas: Secondary | ICD-10-CM | POA: Insufficient documentation

## 2018-01-02 DIAGNOSIS — D801 Nonfamilial hypogammaglobulinemia: Secondary | ICD-10-CM | POA: Insufficient documentation

## 2018-01-02 DIAGNOSIS — C8338 Diffuse large B-cell lymphoma, lymph nodes of multiple sites: Secondary | ICD-10-CM

## 2018-01-02 DIAGNOSIS — Z9221 Personal history of antineoplastic chemotherapy: Secondary | ICD-10-CM | POA: Diagnosis not present

## 2018-01-02 LAB — COMPREHENSIVE METABOLIC PANEL
ALT: 14 U/L (ref 0–44)
AST: 18 U/L (ref 15–41)
Albumin: 3.6 g/dL (ref 3.5–5.0)
Alkaline Phosphatase: 92 U/L (ref 38–126)
Anion gap: 8 (ref 5–15)
BUN: 12 mg/dL (ref 8–23)
CO2: 29 mmol/L (ref 22–32)
Calcium: 8.7 mg/dL — ABNORMAL LOW (ref 8.9–10.3)
Chloride: 100 mmol/L (ref 98–111)
Creatinine, Ser: 0.77 mg/dL (ref 0.44–1.00)
GFR calc Af Amer: >60 mL/min (ref >60)
GFR calc non Af Amer: >60 mL/min (ref >60)
Glucose, Bld: 110 mg/dL — ABNORMAL HIGH (ref 70–99)
Potassium: 3.7 mmol/L (ref 3.5–5.1)
Sodium: 137 mmol/L (ref 135–145)
Total Bilirubin: 0.4 mg/dL (ref 0.3–1.2)
Total Protein: 7.2 g/dL (ref 6.5–8.1)

## 2018-01-02 LAB — CBC WITH DIFFERENTIAL/PLATELET
Abs Immature Granulocytes: 0.02 10*3/uL (ref 0.00–0.07)
Basophils Absolute: 0 10*3/uL (ref 0.0–0.1)
Basophils Relative: 0 %
Eosinophils Absolute: 0.1 10*3/uL (ref 0.0–0.5)
Eosinophils Relative: 1 %
HCT: 38.6 % (ref 36.0–46.0)
Hemoglobin: 11.7 g/dL — ABNORMAL LOW (ref 12.0–15.0)
IMMATURE GRANULOCYTES: 0 %
Lymphocytes Relative: 13 %
Lymphs Abs: 1.1 10*3/uL (ref 0.7–4.0)
MCH: 28.8 pg (ref 26.0–34.0)
MCHC: 30.3 g/dL (ref 30.0–36.0)
MCV: 95.1 fL (ref 80.0–100.0)
MONOS PCT: 4 %
Monocytes Absolute: 0.4 10*3/uL (ref 0.1–1.0)
Neutro Abs: 6.8 10*3/uL (ref 1.7–7.7)
Neutrophils Relative %: 82 %
Platelets: 203 10*3/uL (ref 150–400)
RBC: 4.06 MIL/uL (ref 3.87–5.11)
RDW: 13.6 % (ref 11.5–15.5)
WBC: 8.4 10*3/uL (ref 4.0–10.5)
nRBC: 0 % (ref 0.0–0.2)

## 2018-01-02 LAB — LACTATE DEHYDROGENASE: LDH: 169 U/L (ref 98–192)

## 2018-01-03 LAB — IGG, IGA, IGM
IgA: <5 mg/dL — ABNORMAL LOW (ref 87–352)
IgG (Immunoglobin G), Serum: 1317 mg/dL (ref 700–1600)
IgM (Immunoglobulin M), Srm: 88 mg/dL (ref 26–217)

## 2018-01-04 ENCOUNTER — Encounter (HOSPITAL_COMMUNITY)
Admission: RE | Admit: 2018-01-04 | Discharge: 2018-01-04 | Disposition: A | Discharge status: Home / Self Care | Payer: Medicare Other | Source: Ambulatory Visit | Attending: Internal Medicine | Admitting: Internal Medicine

## 2018-01-04 ENCOUNTER — Other Ambulatory Visit: Payer: Self-pay

## 2018-01-04 ENCOUNTER — Encounter (HOSPITAL_COMMUNITY): Payer: Self-pay

## 2018-01-04 DIAGNOSIS — Z86010 Personal history of colonic polyps: Secondary | ICD-10-CM | POA: Insufficient documentation

## 2018-01-04 DIAGNOSIS — Z01818 Encounter for other preprocedural examination: Secondary | ICD-10-CM | POA: Insufficient documentation

## 2018-01-08 ENCOUNTER — Ambulatory Visit: Payer: Medicare Other | Admitting: Emergency Medicine

## 2018-01-08 ENCOUNTER — Encounter: Payer: Self-pay | Admitting: Emergency Medicine

## 2018-01-08 DIAGNOSIS — K219 Gastro-esophageal reflux disease without esophagitis: Secondary | ICD-10-CM

## 2018-01-08 DIAGNOSIS — J309 Allergic rhinitis, unspecified: Secondary | ICD-10-CM | POA: Diagnosis not present

## 2018-01-08 DIAGNOSIS — J4540 Moderate persistent asthma, uncomplicated: Secondary | ICD-10-CM | POA: Diagnosis not present

## 2018-01-08 NOTE — Assessment & Plan Note (Signed)
She has daily symptoms, particularly cough with some sputum production but this appears to be stable.  She is tolerating her bronchodilator regimen.  She had a bronchitis in November which was treated with antibiotics but not steroids.  We will continue her current bronchodilator regimen, attempt to control her GERD, allergic rhinitis which are contributors.

## 2018-01-08 NOTE — Assessment & Plan Note (Signed)
Continue current regimen

## 2018-01-08 NOTE — Progress Notes (Signed)
Subjective:    Patient ID: [name], female    DOB: [birth year], 63 y.o.   MRN: [id number]  HPI 63 year old never smoker, history of B-cell lymphoma status post treatment, now hypogammaglobulinemia and IgG deficiency, OSA/narcolepsy, DVT with SVC syndrome, chronic sinus disease.  Has been followed by Dr. Ashok Cordia for chronic cough, moderate persistent asthma.  Pulmonary function testing shows an FEV1 of 55% predicted without a bronchodilator response.  Currently managed on zafirlukast qhs, prednisone 5 mg daily, Spiriva, Symbicort.  She has chronic rhinitis and is on fluticasone nasal spray, Benadryl.  She uses albuterol approximately 1-2x a week, often for cough or dyspnea. She does get SOB w exertion, housework. She was treated for bronchitis in November w abx. She has stable wheeze, cough currently. Has had some intermittent low grade fevers.   She has GERD and is on omeprazole 20 mg daily, she reports She is on IVIg every 2 months.  Most recent PET 03/2017 reassuring   Review of Systems Past Medical History:  Diagnosis Date  . Allergic rhinitis   . Allergy   . Anemia   . Anxiety   . Asthma   . B12 deficiency 01/18/2015  . Blood transfusion without reported diagnosis   . Cataract   . Cavitary lung disease   . Chronic kidney disease    related to cancer  . Chronic sinusitis   . Clotting disorder ([place])   . COPD (chronic obstructive pulmonary disease) ([place])   . Depression   . DVT (deep venous thrombosis) ([place]) 09/25/2014  . Endotracheally intubated   . GERD (gastroesophageal reflux disease)   . Hematuria 04/07/2016  . Hyperlipidemia   . Hypertension   . Hypogammaglobulinemia, acquired ([place]) 12/30/2009   Qualifier: Diagnosis of  By: Joya Gaskins MD, Burnett Harry   . Iron deficiency anemia 01/18/2015  . Myocardial infarction ([place]) 2011  . 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 Non Hodgkin's lymphoma ([place]) 2011  . Orofacial dyskinesia 04/22/2015  . Orofacial dyskinesia  04/22/2015  . Osteoporosis   . Peripheral neuropathy due to chemotherapy ([place]) 05/02/2016  . Pneumonia 01/2013  . PONV (postoperative nausea and vomiting)   . Respiratory failure ([place])   . Sleep apnea   . SVC syndrome 09/25/2014     Family History  Problem Relation Age of Onset  . Emphysema Mother   . Stroke Mother   . COPD Mother   . Heart disease Mother        died in sleep  59  . Allergies Father   . Asthma Father        as a child  . Arthritis Father   . Parkinson's disease Father 69  . Leukemia Maternal Grandmother   . Cancer Maternal Grandmother        Leukemia  . Diabetes Brother   . Heart attack Brother   . Hypertension Brother   . Heart disease Brother 40       stents  . Hypertension Sister   . Hypertension Brother      Social History   Socioeconomic History  . Marital status: Divorced    Spouse name: Not on file  . Number of children: 3  . Years of education: 53  . Highest education level: Not on file  Occupational History  . Occupation: Estate agent    Comment: [employer]  . Financial resource strain: Not hard at all  . Food insecurity:    Worry: Never true    Inability: Never true  .  Transportation needs:    Medical: No    Non-medical: No  Tobacco Use  . Smoking status: Never Smoker  . Smokeless tobacco: Never Used  Substance and Sexual Activity  . Alcohol use: No  . Drug use: No  . Sexual activity: Not Currently    Birth control/protection: Surgical    Comment: hyst  Lifestyle  . Physical activity:    Days per week: 0 days    Minutes per session: 0 min  . Stress: Not at all  Relationships  . Social connections:    Talks on phone: More than three times a week    Gets together: More than three times a week    Attends religious service: More than 4 times per year    Active member of club or organization: Yes    Attends meetings of clubs or organizations: More than 4 times per year    Relationship status: Divorced  . Intimate  partner violence:    Fear of current or ex partner: No    Emotionally abused: No    Physically abused: No    Forced sexual activity: No  Other Topics Concern  . Not on file  Social History Narrative   Originally from [place]. Always lived in [place]. Prior travel to [place]. 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 She has 1 dog currently. She has 1 conures (small parrots). No mold exposure in her home. 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 No hot tub exposure. She enjoys sewing & quilting.      Allergies  Allergen Reactions  . Meperidine Hcl Anaphylaxis  . Montelukast Sodium Hives and Rash     Outpatient Medications Prior to Visit  Medication Sig Dispense Refill  . acetaminophen (TYLENOL) 500 MG tablet Take 1,000 mg by mouth every 6 (six) hours as needed for moderate pain or headache.     [name] Kitchen acyclovir (ZOVIRAX) 400 MG tablet Take 1 tablet (400 mg total) by mouth 2 (two) times daily. 60 tablet 3  . albuterol (PROAIR HFA) 108 (90 Base) MCG/ACT inhaler Inhale 2 puffs into the lungs every 6 (six) hours as needed. (Patient taking differently: Inhale 2 puffs into the lungs every 6 (six) hours as needed for wheezing or shortness of breath. ) 8.5 g 2  . albuterol (PROVENTIL) (2.5 MG/3ML) 0.083% nebulizer solution Take 2.5 mg by nebulization every 4 (four) hours as needed for wheezing or shortness of breath.    [name] Kitchen alendronate (FOSAMAX) 70 MG tablet Take 1 tablet (70 mg total) by mouth once a week. Take with a full glass of water on an empty stomach. (Patient taking differently: Take 70 mg by mouth every Sunday. Take with a full glass of water on an empty stomach.) 12 tablet 3  . amphetamine-dextroamphetamine (ADDERALL) 10 MG tablet Take 10 mg by mouth daily.  0  . Armodafinil (NUVIGIL) 150 MG tablet Take 150 mg by mouth daily.    . budesonide-formoterol (SYMBICORT) 160-4.5 MCG/ACT inhaler Inhale 2 puffs into the lungs 2 (two) times  daily. 1 Inhaler 5  . Cholecalciferol (VITAMIN D3 PO) Take 1 capsule by mouth at bedtime.    . citalopram (CELEXA) 20 MG tablet Take 20 mg by mouth every morning.      . cyanocobalamin 1000 MCG tablet Take 1,000 mcg by mouth daily.    . fluticasone (FLONASE) 50 MCG/ACT nasal spray Place 1 spray into both nostrils daily  as needed for allergies.   10  . furosemide (LASIX) 20 MG tablet Take 1 tablet (20 mg total) by mouth as needed for fluid. 90 tablet 1  . gabapentin (NEURONTIN) 300 MG capsule Take 300 mg by mouth 4 (four) times daily.   1  . omeprazole (PRILOSEC) 20 MG capsule TAKE ONE CAPSULE BY MOUTH TWICE DAILY BEFORE A MEAL. (Patient taking differently: Take 20 mg by mouth daily. ) 180 capsule 2  . predniSONE (DELTASONE) 5 MG tablet Take 1 tablet (5 mg total) by mouth daily with breakfast. 30 tablet 5  . simvastatin (ZOCOR) 80 MG tablet Take 1 tablet (80 mg total) by mouth at bedtime. 90 tablet 3  . tiotropium (SPIRIVA) 18 MCG inhalation capsule Place 1 capsule (18 mcg total) into inhaler and inhale daily. 30 capsule 3  . zafirlukast (ACCOLATE) 20 MG tablet Take 1 tablet (20 mg total) by mouth 2 (two) times daily. 60 tablet 1   Facility-Administered Medications Prior to Visit  Medication Dose Route Frequency Provider Last Rate Last Dose  . 0.9 %  sodium chloride infusion   Intravenous Once Kefalas, Thomas S, PA-C      . 0.9 %  sodium chloride infusion   Intravenous Once Kefalas, Thomas S, PA-C      . acetaminophen (TYLENOL) tablet 650 mg  650 mg Oral Q6H PRN Baird Cancer, PA-C      . acetaminophen (TYLENOL) tablet 650 mg  650 mg Oral Q6H PRN Kefalas, Manon Hilding, PA-C      . dextrose 5 % solution   Intravenous Continuous Kefalas, Manon Hilding, PA-C   Stopped at 08/25/15 1400  . dextrose 5 % solution   Intravenous Continuous Baird Cancer, PA-C   Stopped at 07/13/16 1350  . dextrose 5 % solution   Intravenous Continuous Holley Bouche, NP   Stopped at 02/28/17 1230  . diphenhydrAMINE  (BENADRYL) capsule 25 mg  25 mg Oral Once Kefalas, Thomas S, PA-C      . diphenhydrAMINE (BENADRYL) capsule 25 mg  25 mg Oral Once Kefalas, Thomas S, PA-C      . sodium chloride 0.9 % injection 10 mL  10 mL Intracatheter PRN Baird Cancer, PA-C   10 mL at 08/25/15 1125       Objective:   Physical Exam Vitals:   01/08/18 1619  BP: 116/80  Pulse: 72  SpO2: 96%  Weight: 141 lb (64 kg)  Height: 5' (1.524 m)   Gen: Pleasant, well-nourished, in no distress,  normal affect, coarse cough  ENT: No lesions,  mouth clear,  oropharynx clear, no postnasal drip  Neck: No JVD, no stridor  Lungs: No use of accessory muscles, no crackles or wheezing on normal respiration, low pitched wheeze bilaterally on forced expiration  Cardiovascular: RRR, heart sounds normal, no murmur or gallops, no peripheral edema  Musculoskeletal: No deformities, no cyanosis or clubbing  Neuro: alert, awake, non focal  Skin: Warm, no lesions or rash      Assessment & Plan:  Moderate persistent asthma She has daily symptoms, particularly cough with some sputum production but this appears to be stable.  She is tolerating her bronchodilator regimen.  She had a bronchitis in November which was treated with antibiotics but not steroids.  We will continue her current bronchodilator regimen, attempt to control her GERD, allergic rhinitis which are contributors.  GERD Continue current regimen  Allergic rhinitis Continue current regimen  Baltazar Apo, MD, PhD 01/08/2018, 4:41 PM [facility] [phone] or if no answer [phone]

## 2018-01-08 NOTE — Patient Instructions (Signed)
Please continue Spiriva and Symbicort as you have been taking them. Please keep albuterol available use 2 puffs if needed for shortness of breath, cough, chest tightness, wheeze. Continue prednisone 5 mg daily. Continue zafirlukast each evening Continue fluticasone nasal spray, Benadryl as you are taking them. Continue omeprazole 20 mg daily. IVIG treatments as scheduled Follow with Dr Lamonte Sakai in 6 months or sooner if you have any problems

## 2018-01-09 ENCOUNTER — Inpatient Hospital Stay (HOSPITAL_COMMUNITY): Payer: Medicare Other | Attending: Hematology

## 2018-01-09 ENCOUNTER — Other Ambulatory Visit: Payer: Self-pay

## 2018-01-09 ENCOUNTER — Encounter (HOSPITAL_COMMUNITY): Payer: Self-pay | Admitting: Hematology

## 2018-01-09 ENCOUNTER — Inpatient Hospital Stay (HOSPITAL_BASED_OUTPATIENT_CLINIC_OR_DEPARTMENT_OTHER): Payer: Medicare Other | Admitting: Hematology

## 2018-01-09 ENCOUNTER — Encounter (HOSPITAL_COMMUNITY): Payer: Self-pay

## 2018-01-09 VITALS — BP 151/74 | HR 76 | Temp 98.2°F | Resp 18

## 2018-01-09 DIAGNOSIS — Z7952 Long term (current) use of systemic steroids: Secondary | ICD-10-CM | POA: Insufficient documentation

## 2018-01-09 DIAGNOSIS — Z9221 Personal history of antineoplastic chemotherapy: Secondary | ICD-10-CM

## 2018-01-09 DIAGNOSIS — D801 Nonfamilial hypogammaglobulinemia: Secondary | ICD-10-CM | POA: Diagnosis not present

## 2018-01-09 DIAGNOSIS — C8330 Diffuse large B-cell lymphoma, unspecified site: Secondary | ICD-10-CM | POA: Diagnosis not present

## 2018-01-09 DIAGNOSIS — C8338 Diffuse large B-cell lymphoma, lymph nodes of multiple sites: Secondary | ICD-10-CM

## 2018-01-09 MED ORDER — IMMUNE GLOBULIN (HUMAN) 20 GM/200ML IV SOLN
40.0000 g | Freq: Once | INTRAVENOUS | Status: AC
  Administered 2018-01-09: 40 g via INTRAVENOUS
  Filled 2018-01-09: qty 400

## 2018-01-09 MED ORDER — TIOTROPIUM BROMIDE MONOHYDRATE 18 MCG IN CAPS: 18.0000 ug | ORAL_CAPSULE | Freq: Every day | RESPIRATORY_TRACT | 3 refills | Status: DC

## 2018-01-09 MED ORDER — DEXTROSE 5 % IV SOLN
INTRAVENOUS | Status: DC
  Administered 2018-01-09: 10:00:00 via INTRAVENOUS

## 2018-01-09 MED ORDER — ACETAMINOPHEN 325 MG PO TABS: 650.0000 mg | ORAL_TABLET | Freq: Four times a day (QID) | ORAL | Status: DC | PRN

## 2018-01-09 MED ORDER — SODIUM CHLORIDE 0.9 % IJ SOLN
10.0000 mL | INTRAMUSCULAR | Status: DC | PRN
  Filled 2018-01-09: qty 10

## 2018-01-09 MED ORDER — BUDESONIDE-FORMOTEROL FUMARATE 160-4.5 MCG/ACT IN AERO: 2.0000 | INHALATION_SPRAY | Freq: Two times a day (BID) | RESPIRATORY_TRACT | 5 refills | Status: DC

## 2018-01-09 MED ORDER — ZAFIRLUKAST 20 MG PO TABS: 20.0000 mg | ORAL_TABLET | Freq: Two times a day (BID) | ORAL | 1 refills | Status: DC

## 2018-01-09 MED ORDER — DIPHENHYDRAMINE HCL 25 MG PO CAPS: 25.0000 mg | ORAL_CAPSULE | Freq: Once | ORAL | Status: DC

## 2018-01-09 NOTE — Progress Notes (Signed)
Pt presents today for IVIG. Pt has appt with Dr. Delton Coombes. Initial BP elevated . VSS. VO received by Dr. Delton Coombes to proceed with treatment. Reported BP of 166/83. MD aware. Reported pt was on antibiotics December for a cough. MD aware.  Pt has history of asthma. No new orders received. BP recheck 144/79 prior to IVIG infusion.   Treatment given today per MD orders. Tolerated infusion without adverse affects. Vital signs stable. No complaints at this time. Discharged from clinic ambulatory. F/U with [facility] as scheduled.

## 2018-01-09 NOTE — Addendum Note (Signed)
Addended by: Valerie Salts on: 01/09/2018 08:53 AM   Modules accepted: Orders

## 2018-01-09 NOTE — Assessment & Plan Note (Signed)
1.  Acquired hypogammaglobulinemia: -She was receiving IVIG every 4 weeks.  This was switched to every 6 weeks in July.  She has been tolerating it very well. -Did not have any infections requiring antibiotics in the last 3 to 4 months. -Her IgG trough level was 1166 on 10/30/2017.  She takes 5 mg of prednisone daily for her breathing. - Last IVIG was given on 10/31/2017. -She had bronchitis in December when she ran fevers of 100-101 in the evenings.  She was treated with Augmentin and the fever subsided.  She still has some cough. - We reviewed her blood work.  IgG trough level was 1300.  She may proceed with IVIG today.  This is her first q. 8 weeks infusion. -We will see her back in 8 weeks for follow-up.  2.  Diffuse large B-cell lymphoma, stage IVb:  -She was treated in 2011.  She was treated with 4 cycles of R CHOP with intrathecal methotrexate during cycle 3 on 4.  She had developed Pseudomonas sepsis after cycles 2 and 4 and therapy was truncated after 4 cycles.  She also received 2 years of maintenance rituximab.  PET CT scan on 03/20/2017 did not show any evidence of lymphoma. -Today's physical examination did not reveal any palpable adenopathy or splenomegaly. -LDH was within normal limits.  We will do scans if clinical condition dictates.  3.  Left lung nodule: There is no evidence of left lung nodule on the PET/CT scan from March 2019.  4.  Health maintenance: -She had a mammogram on 09/08/2017 which was BI-RADS 1.

## 2018-01-09 NOTE — Patient Instructions (Signed)
[facility] Discharge Instructions for Patients Receiving Chemotherapy  Today you received the following chemotherapy agents   To help prevent nausea and vomiting after your treatment, we encourage you to take your nausea medication   If you develop nausea and vomiting that is not controlled by your nausea medication, call the clinic.   BELOW ARE SYMPTOMS THAT SHOULD BE REPORTED IMMEDIATELY:  *FEVER GREATER THAN 100.5 F  *CHILLS WITH OR WITHOUT FEVER  NAUSEA AND VOMITING THAT IS NOT CONTROLLED WITH YOUR NAUSEA MEDICATION  *UNUSUAL SHORTNESS OF BREATH  *UNUSUAL BRUISING OR BLEEDING  TENDERNESS IN MOUTH AND THROAT WITH OR WITHOUT PRESENCE OF ULCERS  *URINARY PROBLEMS  *BOWEL PROBLEMS  UNUSUAL RASH Items with * indicate a potential emergency and should be followed up as soon as possible.  Feel free to call the clinic should you have any questions or concerns. The clinic phone number is [phone].  Please show the CHEMO ALERT CARD at check-in to the Emergency Department and triage nurse.   

## 2018-01-09 NOTE — Patient Instructions (Signed)
[place] at [facility] Discharge Instructions  Follow up in 8 weeks for labs and treatment.   Thank you for choosing [place] at [facility] to provide your oncology and hematology care.  To afford each patient quality time with our provider, please arrive at least 15 minutes before your scheduled appointment time.   If you have a lab appointment with the [facility] please come in thru the  Main Entrance and check in at the main information desk  You need to re-schedule your appointment should you arrive 10 or more minutes late.  We strive to give you quality time with our providers, and arriving late affects you and other patients whose appointments are after yours.  Also, if you no show three or more times for appointments you may be dismissed from the clinic at the providers discretion.     Again, thank you for choosing [facility].  Our hope is that these requests will decrease the amount of time that you wait before being seen by our physicians.       _____________________________________________________________  Should you have questions after your visit to [facility], please contact our office at [phone] between the hours of 8:00 a.m. and 4:30 p.m.  Voicemails left after 4:00 p.m. will not be returned until the following business day.  For prescription refill requests, have your pharmacy contact our office and allow 72 hours.    Cancer Center Support Programs:   > Cancer Support Group  2nd Tuesday of the month 1pm-2pm, Journey Room

## 2018-01-09 NOTE — Progress Notes (Signed)
[place], [place] [address]   CLINIC:  Medical Oncology/Hematology  PCP:  Sharilyn Sites, MD [address] [place] [address] [phone]   REASON FOR VISIT: Follow-up for hypogammaglobulinemia AND non-hodgkin's lymphoma  CURRENT THERAPY: IVIG every 6 weeks AND observation  BRIEF ONCOLOGIC HISTORY:    Non Hodgkin's lymphoma ([place])   09/09/2009 Initial Diagnosis    Non Hodgkin's lymphoma ([place])      CANCER STAGING: Cancer Staging Non Hodgkin's lymphoma ([place]) Staging form: Lymphoid Neoplasms, AJCC 6th Edition - Clinical: Stage IV - Signed by Baird Cancer, PA on 11/05/2010    INTERVAL HISTORY:  Ms. Amara 63 y.o. female returns for routine follow-up for hypogammaglobulinemia and non-hodgkin's lymphoma. She is here today and reports that she had bronchitis for the whole month of December. She ran low grade fevers for the whole month. She did complete a round of antibiotics and it is improving now and she no longer has the fevers. She still has a non productive cough. Denies any nausea, vomiting, or diarrhea. Denies any new pains. Had not noticed any recent bleeding such as epistaxis, hematuria or hematochezia. Denies recent chest pain on exertion, shortness of breath on minimal exertion, pre-syncopal episodes, or palpitations. Denies any numbness or tingling in hands or feet. Denies any recent hospitalizations. She reports her appetite and energy level at 50%.     REVIEW OF SYSTEMS:  Review of Systems  Constitutional: Positive for fever.  All other systems reviewed and are negative.    PAST MEDICAL/SURGICAL HISTORY:  Past Medical History:  Diagnosis Date  . Allergic rhinitis   . Allergy   . Anemia   . Anxiety   . Asthma   . B12 deficiency 01/18/2015  . Blood transfusion without reported diagnosis   . Cataract   . Cavitary lung disease   . Chronic kidney disease    related to cancer  . Chronic sinusitis   .  Clotting disorder ([place])   . COPD (chronic obstructive pulmonary disease) ([place])   . Depression   . DVT (deep venous thrombosis) ([place]) 09/25/2014  . Endotracheally intubated   . GERD (gastroesophageal reflux disease)   . Hematuria 04/07/2016  . Hyperlipidemia   . Hypertension   . Hypogammaglobulinemia, acquired ([place]) 12/30/2009   Qualifier: Diagnosis of  By: Joya Gaskins MD, Burnett Harry   . Iron deficiency anemia 01/18/2015  . Myocardial infarction ([place]) 2011  . 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 Non Hodgkin's lymphoma ([place]) 2011  . Orofacial dyskinesia 04/22/2015  . Orofacial dyskinesia 04/22/2015  . Osteoporosis   . Peripheral neuropathy due to chemotherapy ([facility]) 05/02/2016  . Pneumonia 01/2013  . PONV (postoperative nausea and vomiting)   . Respiratory failure ([place])   . Sleep apnea   . SVC syndrome 09/25/2014   Past Surgical History:  Procedure Laterality Date  . ABDOMINAL HYSTERECTOMY     Fibroids  . BASAL CELL CARCINOMA EXCISION  03/2011   scalp  . BREAST SURGERY Right    biopsy  . CATARACT EXTRACTION W/PHACO Left 11/17/2014   Procedure: CATARACT EXTRACTION PHACO AND INTRAOCULAR LENS PLACEMENT LEFT EYE;  Surgeon: Tonny Branch, MD;  Location: [facility];  Service: Ophthalmology;  Laterality: Left;  CDE:5.60  . CATARACT EXTRACTION W/PHACO Right 12/15/2014   Procedure: CATARACT EXTRACTION PHACO AND INTRAOCULAR LENS PLACEMENT RIGHT EYE CDE=5.16;  Surgeon: Tonny Branch, MD;  Location: [facility];  Service: Ophthalmology;  Laterality: Right;  . COLONOSCOPY N/A 11/14/2012   Procedure: COLONOSCOPY;  Surgeon: Rogene Houston, MD;  Location: [facility] SUITE;  Service: Endoscopy;  Laterality: N/A;  830  . EYE SURGERY    . NASAL SINUS SURGERY    . NECK SURGERY     x 2   . PERIPHERALLY INSERTED CENTRAL CATHETER INSERTION    . PICC Removal    . PORT-A-CATH REMOVAL    . PORTACATH PLACEMENT  7/11   Removed 6/12  . TRACHEOSTOMY    . VESICOVAGINAL FISTULA CLOSURE W/ TAH       SOCIAL HISTORY:    Social History   Socioeconomic History  . Marital status: Divorced    Spouse name: Not on file  . Number of children: 3  . Years of education: 25  . Highest education level: Not on file  Occupational History  . Occupation: Estate agent    Comment: [employer]  . Financial resource strain: Not hard at all  . Food insecurity:    Worry: Never true    Inability: Never true  . Transportation needs:    Medical: No    Non-medical: No  Tobacco Use  . Smoking status: Never Smoker  . Smokeless tobacco: Never Used  Substance and Sexual Activity  . Alcohol use: No  . Drug use: No  . Sexual activity: Not Currently    Birth control/protection: Surgical    Comment: hyst  Lifestyle  . Physical activity:    Days per week: 0 days    Minutes per session: 0 min  . Stress: Not at all  Relationships  . Social connections:    Talks on phone: More than three times a week    Gets together: More than three times a week    Attends religious service: More than 4 times per year    Active member of club or organization: Yes    Attends meetings of clubs or organizations: More than 4 times per year    Relationship status: Divorced  . Intimate partner violence:    Fear of current or ex partner: No    Emotionally abused: No    Physically abused: No    Forced sexual activity: No  Other Topics Concern  . Not on file  Social History Narrative   Originally from [place]. Always lived in [place]. Prior travel to [place]. 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 She has 1 dog currently. She has 1 conures (small parrots). No mold exposure in her home. 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 No hot tub exposure. She enjoys sewing & quilting.     FAMILY HISTORY:  Family History  Problem Relation Age of Onset  . Emphysema Mother   . Stroke Mother   . COPD Mother   . Heart disease Mother        died in sleep  30  . Allergies  Father   . Asthma Father        as a child  . Arthritis Father   . Parkinson's disease Father 66  . Leukemia Maternal Grandmother   . Cancer Maternal Grandmother        Leukemia  . Diabetes Brother   . Heart attack Brother   . Hypertension Brother   . Heart disease Brother 40       stents  . Hypertension Sister   . Hypertension Brother     CURRENT MEDICATIONS:  Outpatient Encounter Medications as of  01/09/2018  Medication Sig  . acetaminophen (TYLENOL) 500 MG tablet Take 1,000 mg by mouth every 6 (six) hours as needed for moderate pain or headache.   [name] Kitchen acyclovir (ZOVIRAX) 400 MG tablet Take 1 tablet (400 mg total) by mouth 2 (two) times daily.  [name] Kitchen albuterol (PROAIR HFA) 108 (90 Base) MCG/ACT inhaler Inhale 2 puffs into the lungs every 6 (six) hours as needed. (Patient taking differently: Inhale 2 puffs into the lungs every 6 (six) hours as needed for wheezing or shortness of breath. )  . alendronate (FOSAMAX) 70 MG tablet Take 1 tablet (70 mg total) by mouth once a week. Take with a full glass of water on an empty stomach. (Patient taking differently: Take 70 mg by mouth every Sunday. Take with a full glass of water on an empty stomach.)  . amphetamine-dextroamphetamine (ADDERALL) 10 MG tablet Take 10 mg by mouth daily.  . Armodafinil (NUVIGIL) 150 MG tablet Take 150 mg by mouth daily.  . Cholecalciferol (VITAMIN D3 PO) Take 1 capsule by mouth at bedtime.  . citalopram (CELEXA) 20 MG tablet Take 20 mg by mouth every morning.    . cyanocobalamin 1000 MCG tablet Take 1,000 mcg by mouth daily.  . fluticasone (FLONASE) 50 MCG/ACT nasal spray Place 1 spray into both nostrils daily as needed for allergies.   . furosemide (LASIX) 20 MG tablet Take 1 tablet (20 mg total) by mouth as needed for fluid.  [name] Kitchen gabapentin (NEURONTIN) 300 MG capsule Take 300 mg by mouth 4 (four) times daily.   [name] Kitchen omeprazole (PRILOSEC) 20 MG capsule TAKE ONE CAPSULE BY MOUTH TWICE DAILY BEFORE A MEAL. (Patient taking  differently: Take 20 mg by mouth daily. )  . predniSONE (DELTASONE) 5 MG tablet Take 1 tablet (5 mg total) by mouth daily with breakfast.  . simvastatin (ZOCOR) 80 MG tablet Take 1 tablet (80 mg total) by mouth at bedtime.  . [DISCONTINUED] budesonide-formoterol (SYMBICORT) 160-4.5 MCG/ACT inhaler Inhale 2 puffs into the lungs 2 (two) times daily.  . [DISCONTINUED] tiotropium (SPIRIVA) 18 MCG inhalation capsule Place 1 capsule (18 mcg total) into inhaler and inhale daily.  . [DISCONTINUED] zafirlukast (ACCOLATE) 20 MG tablet Take 1 tablet (20 mg total) by mouth 2 (two) times daily.  [name] Kitchen albuterol (PROVENTIL) (2.5 MG/3ML) 0.083% nebulizer solution Take 2.5 mg by nebulization every 4 (four) hours as needed for wheezing or shortness of breath.   Facility-Administered Encounter Medications as of 01/09/2018  Medication  . 0.9 %  sodium chloride infusion  . 0.9 %  sodium chloride infusion  . acetaminophen (TYLENOL) tablet 650 mg  . acetaminophen (TYLENOL) tablet 650 mg  . dextrose 5 % solution  . dextrose 5 % solution  . dextrose 5 % solution  . diphenhydrAMINE (BENADRYL) capsule 25 mg  . diphenhydrAMINE (BENADRYL) capsule 25 mg  . sodium chloride 0.9 % injection 10 mL    ALLERGIES:  Allergies  Allergen Reactions  . Meperidine Hcl Anaphylaxis  . Montelukast Sodium Hives and Rash     PHYSICAL EXAM:  ECOG Performance status: 1  Vitals:   01/09/18 0845  BP: (!) 166/83  Pulse: 85  Resp: 18  Temp: 98.1 F (36.7 C)  SpO2: 97%   Filed Weights   01/09/18 0845  Weight: 141 lb 9.6 oz (64.2 kg)    Physical Exam Constitutional:      Appearance: Normal appearance. She is normal weight.  Cardiovascular:     Rate and Rhythm: Normal rate and regular rhythm.  Heart sounds: Normal heart sounds.  Pulmonary:     Breath sounds: Wheezing and rhonchi present.  Musculoskeletal: Normal range of motion.  Skin:    General: Skin is warm and dry.  Neurological:     Mental Status: She is alert  and oriented to person, place, and time. Mental status is at baseline.  Psychiatric:        Mood and Affect: Mood normal.        Behavior: Behavior normal.        Thought Content: Thought content normal.        Judgment: Judgment normal.      LABORATORY DATA:  I have reviewed the labs as listed.  CBC    Component Value Date/Time   WBC 8.4 01/02/2018 1041   RBC 4.06 01/02/2018 1041   HGB 11.7 (L) 01/02/2018 1041   HCT 38.6 01/02/2018 1041   PLT 203 01/02/2018 1041   MCV 95.1 01/02/2018 1041   MCH 28.8 01/02/2018 1041   MCHC 30.3 01/02/2018 1041   RDW 13.6 01/02/2018 1041   LYMPHSABS 1.1 01/02/2018 1041   MONOABS 0.4 01/02/2018 1041   EOSABS 0.1 01/02/2018 1041   BASOSABS 0.0 01/02/2018 1041   CMP Latest Ref Rng & Units 01/02/2018 10/30/2017 09/19/2017  Glucose 70 - 99 mg/dL 110(H) 118(H) 110(H)  BUN 8 - 23 mg/dL 12 11 11   Creatinine 0.44 - 1.00 mg/dL 0.77 0.88 0.96  Sodium 135 - 145 mmol/L 137 138 140  Potassium 3.5 - 5.1 mmol/L 3.7 3.9 3.5  Chloride 98 - 111 mmol/L 100 99 102  CO2 22 - 32 mmol/L 29 29 27   Calcium 8.9 - 10.3 mg/dL 8.7(L) 8.8(L) 8.7(L)  Total Protein 6.5 - 8.1 g/dL 7.2 7.2 7.0  Total Bilirubin 0.3 - 1.2 mg/dL 0.4 0.7 0.9  Alkaline Phos 38 - 126 U/L 92 92 89  AST 15 - 41 U/L 18 16 21   ALT 0 - 44 U/L 14 11 14        DIAGNOSTIC IMAGING:  I have independently reviewed the scans and discussed with the patient.   I have reviewed Francene Finders, NP's note and agree with the documentation.  I personally performed a face-to-face visit, made revisions and my assessment and plan is as follows.    ASSESSMENT & PLAN:   Non Hodgkin's lymphoma ([place]) 1.  Acquired hypogammaglobulinemia: -She was receiving IVIG every 4 weeks.  This was switched to every 6 weeks in July.  She has been tolerating it very well. -Did not have any infections requiring antibiotics in the last 3 to 4 months. -Her IgG trough level was 1166 on 10/30/2017.  She takes 5 mg of prednisone  daily for her breathing. - Last IVIG was given on 10/31/2017. -She had bronchitis in December when she ran fevers of 100-101 in the evenings.  She was treated with Augmentin and the fever subsided.  She still has some cough. - We reviewed her blood work.  IgG trough level was 1300.  She may proceed with IVIG today.  This is her first q. 8 weeks infusion. -We will see her back in 8 weeks for follow-up.  2.  Diffuse large B-cell lymphoma, stage IVb:  -She was treated in 2011.  She was treated with 4 cycles of R CHOP with intrathecal methotrexate during cycle 3 on 4.  She had developed Pseudomonas sepsis after cycles 2 and 4 and therapy was truncated after 4 cycles.  She also received 2 years of maintenance rituximab.  PET  CT scan on 03/20/2017 did not show any evidence of lymphoma. -Today's physical examination did not reveal any palpable adenopathy or splenomegaly. -LDH was within normal limits.  We will do scans if clinical condition dictates.  3.  Left lung nodule: There is no evidence of left lung nodule on the PET/CT scan from March 2019.  4.  Health maintenance: -She had a mammogram on 09/08/2017 which was BI-RADS 1.      Orders placed this encounter:  No orders of the defined types were placed in this encounter.     Derek Jack, MD [place] [phone]

## 2018-01-10 IMAGING — DX DG CHEST 2V
2 series · 2 of 2 positions shown · non-contrast
Comparison: 08/25/2015, 01/16/2014

CLINICAL DATA: Cough and congestion, intermittent fever, history of
asthma

EXAM:
CHEST  2 VIEW

[chest pa]
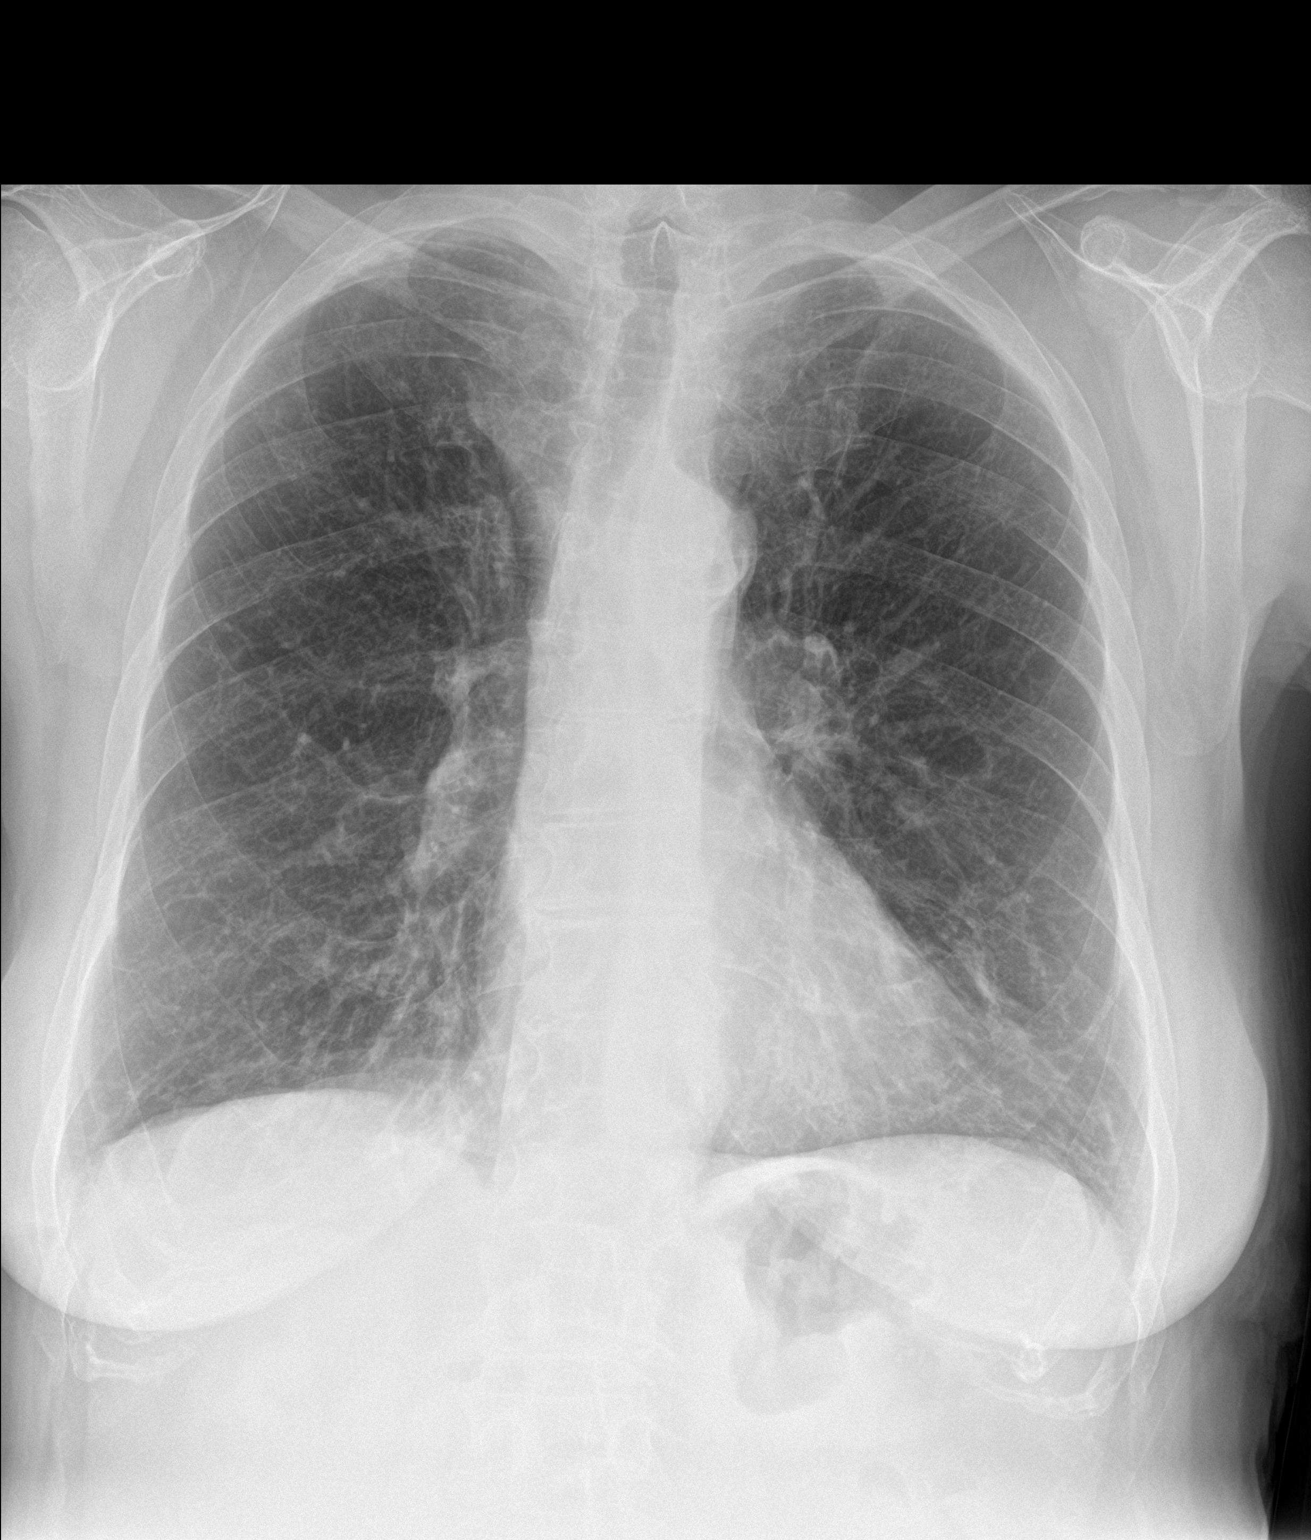

[chest lat]
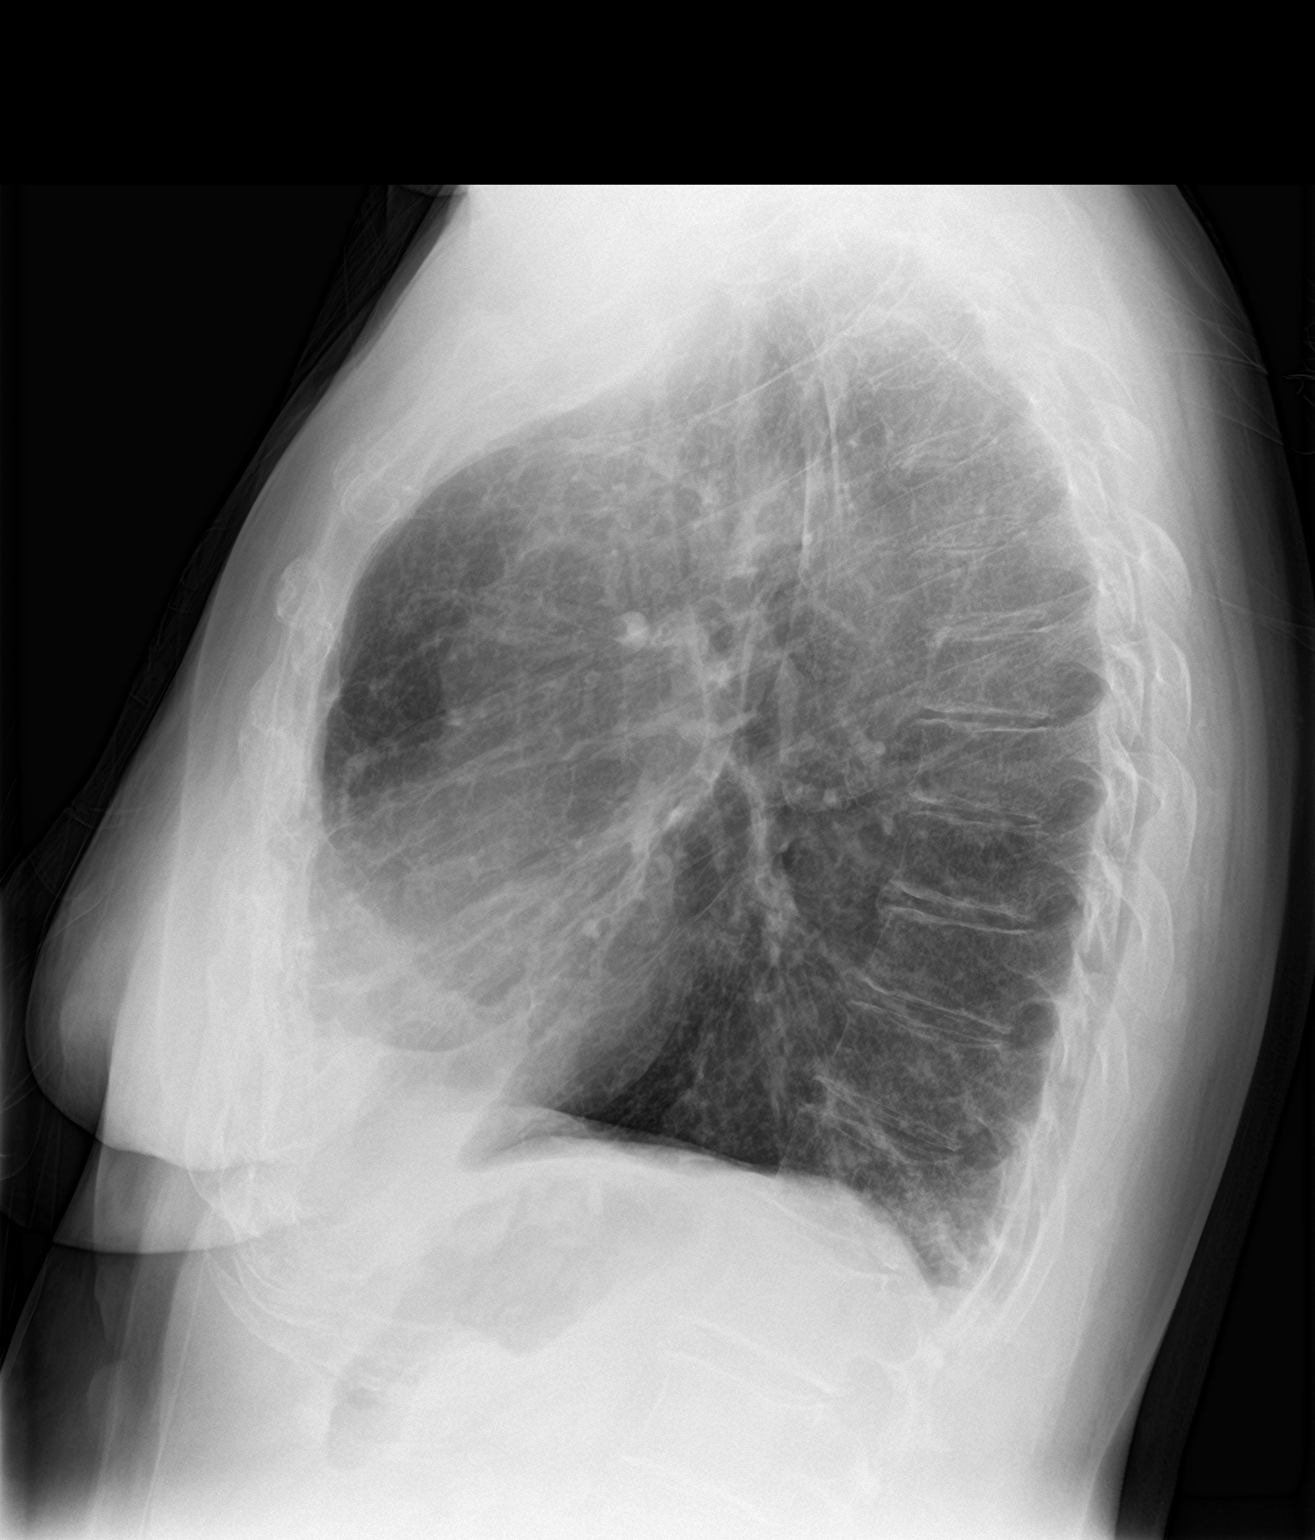

[2 of 2 positions shown; findings below may reference images not displayed]

FINDINGS: Stable mild hyperinflation compatible with COPD/asthma. Normal heart
size and vascularity. No superimposed pneumonia, collapse or
consolidation. Minor basilar scarring as before. Negative for edema,
effusion or pneumothorax. Trachea is midline. Atherosclerosis noted
of the aorta. Degenerative changes of the spine. Lower cervical
fusion hardware evident. Bones are osteopenic.
IMPRESSION: Stable hyperinflation without superimposed acute chest process.

No significant interval change.

## 2018-01-12 ENCOUNTER — Ambulatory Visit (HOSPITAL_COMMUNITY)
Admission: RE | Admit: 2018-01-12 | Discharge: 2018-01-12 | Disposition: A | Discharge status: Home / Self Care | Payer: Medicare Other | Attending: Internal Medicine | Admitting: Internal Medicine

## 2018-01-12 ENCOUNTER — Encounter (HOSPITAL_COMMUNITY): Payer: Self-pay

## 2018-01-12 ENCOUNTER — Encounter (HOSPITAL_COMMUNITY)
Admission: RE | Disposition: A | Discharge status: Home / Self Care | Payer: Self-pay | Source: Home / Self Care | Attending: Internal Medicine

## 2018-01-12 ENCOUNTER — Ambulatory Visit (HOSPITAL_COMMUNITY): Payer: Medicare Other | Admitting: Anesthesiology

## 2018-01-12 DIAGNOSIS — I252 Old myocardial infarction: Secondary | ICD-10-CM | POA: Insufficient documentation

## 2018-01-12 DIAGNOSIS — Z1211 Encounter for screening for malignant neoplasm of colon: Secondary | ICD-10-CM | POA: Insufficient documentation

## 2018-01-12 DIAGNOSIS — K219 Gastro-esophageal reflux disease without esophagitis: Secondary | ICD-10-CM | POA: Insufficient documentation

## 2018-01-12 DIAGNOSIS — D509 Iron deficiency anemia, unspecified: Secondary | ICD-10-CM | POA: Insufficient documentation

## 2018-01-12 DIAGNOSIS — Z09 Encounter for follow-up examination after completed treatment for conditions other than malignant neoplasm: Secondary | ICD-10-CM | POA: Diagnosis not present

## 2018-01-12 DIAGNOSIS — Z860100 Personal history of colon polyps, unspecified: Secondary | ICD-10-CM | POA: Insufficient documentation

## 2018-01-12 DIAGNOSIS — Z8572 Personal history of non-Hodgkin lymphomas: Secondary | ICD-10-CM | POA: Insufficient documentation

## 2018-01-12 DIAGNOSIS — D123 Benign neoplasm of transverse colon: Secondary | ICD-10-CM | POA: Insufficient documentation

## 2018-01-12 DIAGNOSIS — F419 Anxiety disorder, unspecified: Secondary | ICD-10-CM | POA: Insufficient documentation

## 2018-01-12 DIAGNOSIS — D122 Benign neoplasm of ascending colon: Secondary | ICD-10-CM | POA: Diagnosis not present

## 2018-01-12 DIAGNOSIS — Z86718 Personal history of other venous thrombosis and embolism: Secondary | ICD-10-CM | POA: Insufficient documentation

## 2018-01-12 DIAGNOSIS — I2510 Atherosclerotic heart disease of native coronary artery without angina pectoris: Secondary | ICD-10-CM | POA: Insufficient documentation

## 2018-01-12 DIAGNOSIS — E785 Hyperlipidemia, unspecified: Secondary | ICD-10-CM | POA: Diagnosis not present

## 2018-01-12 DIAGNOSIS — Z85828 Personal history of other malignant neoplasm of skin: Secondary | ICD-10-CM | POA: Diagnosis not present

## 2018-01-12 DIAGNOSIS — D125 Benign neoplasm of sigmoid colon: Secondary | ICD-10-CM | POA: Diagnosis not present

## 2018-01-12 DIAGNOSIS — K5730 Diverticulosis of large intestine without perforation or abscess without bleeding: Secondary | ICD-10-CM | POA: Insufficient documentation

## 2018-01-12 DIAGNOSIS — Z79899 Other long term (current) drug therapy: Secondary | ICD-10-CM | POA: Insufficient documentation

## 2018-01-12 DIAGNOSIS — J449 Chronic obstructive pulmonary disease, unspecified: Secondary | ICD-10-CM | POA: Insufficient documentation

## 2018-01-12 DIAGNOSIS — I10 Essential (primary) hypertension: Secondary | ICD-10-CM | POA: Insufficient documentation

## 2018-01-12 DIAGNOSIS — F329 Major depressive disorder, single episode, unspecified: Secondary | ICD-10-CM | POA: Insufficient documentation

## 2018-01-12 DIAGNOSIS — K644 Residual hemorrhoidal skin tags: Secondary | ICD-10-CM

## 2018-01-12 HISTORY — PX: COLONOSCOPY WITH PROPOFOL: SHX5780

## 2018-01-12 HISTORY — PX: POLYPECTOMY: SHX5525

## 2018-01-12 SURGERY — COLONOSCOPY WITH PROPOFOL
Anesthesia: Monitor Anesthesia Care

## 2018-01-12 MED ORDER — MEPERIDINE HCL 100 MG/ML IJ SOLN: 6.2500 mg | INTRAMUSCULAR | Status: DC | PRN

## 2018-01-12 MED ORDER — CHLORHEXIDINE GLUCONATE CLOTH 2 % EX PADS: 6.0000 | MEDICATED_PAD | Freq: Once | CUTANEOUS | Status: DC

## 2018-01-12 MED ORDER — LACTATED RINGERS IV SOLN: INTRAVENOUS | Status: DC

## 2018-01-12 MED ORDER — HYDROMORPHONE HCL 1 MG/ML IJ SOLN: 0.2500 mg | INTRAMUSCULAR | Status: DC | PRN

## 2018-01-12 MED ORDER — HYDROCODONE-ACETAMINOPHEN 7.5-325 MG PO TABS: 1.0000 | ORAL_TABLET | Freq: Once | ORAL | Status: DC | PRN

## 2018-01-12 MED ORDER — LIDOCAINE HCL (CARDIAC) PF 50 MG/5ML IV SOSY
PREFILLED_SYRINGE | INTRAVENOUS | Status: DC | PRN
  Administered 2018-01-12: 40 mg via INTRAVENOUS

## 2018-01-12 MED ORDER — LACTATED RINGERS IV SOLN
INTRAVENOUS | Status: DC | PRN
  Administered 2018-01-12: 07:00:00 via INTRAVENOUS

## 2018-01-12 MED ORDER — PROMETHAZINE HCL 25 MG/ML IJ SOLN: 6.2500 mg | INTRAMUSCULAR | Status: DC | PRN

## 2018-01-12 MED ORDER — GLYCOPYRROLATE 0.2 MG/ML IJ SOLN
INTRAMUSCULAR | Status: DC | PRN
  Administered 2018-01-12: 0.2 mg via INTRAVENOUS

## 2018-01-12 MED ORDER — PROPOFOL 10 MG/ML IV BOLUS
INTRAVENOUS | Status: DC | PRN
  Administered 2018-01-12: 30 mg via INTRAVENOUS

## 2018-01-12 MED ORDER — PROPOFOL 10 MG/ML IV BOLUS
INTRAVENOUS | Status: AC
  Filled 2018-01-12: qty 80

## 2018-01-12 MED ORDER — PROPOFOL 500 MG/50ML IV EMUL
INTRAVENOUS | Status: DC | PRN
  Administered 2018-01-12: 08:00:00 via INTRAVENOUS
  Administered 2018-01-12: 150 ug/kg/min via INTRAVENOUS

## 2018-01-12 NOTE — Anesthesia Postprocedure Evaluation (Signed)
Anesthesia Post Note  Patient: [name]  Procedure(s) Performed: COLONOSCOPY WITH PROPOFOL (N/A ) POLYPECTOMY  Patient location during evaluation: PACU Anesthesia Type: MAC Level of consciousness: awake and alert and oriented Pain management: pain level controlled Vital Signs Assessment: post-procedure vital signs reviewed and stable Respiratory status: spontaneous breathing Cardiovascular status: stable Postop Assessment: no apparent nausea or vomiting Anesthetic complications: no     Last Vitals:  Vitals:   01/12/18 0644 01/12/18 0815  BP: (!) 156/84   Pulse: 69   Resp: 16   Temp: 36.7 C (P) 36.6 C  SpO2: 94%     Last Pain:  Vitals:   01/12/18 0644  TempSrc: Oral  PainSc: 0-No pain                 Amaree Leeper A

## 2018-01-12 NOTE — Anesthesia Procedure Notes (Signed)
Procedure Name: MAC Date/Time: 01/12/2018 7:30 AM Performed by: Andree Elk Ezariah Nace A, CRNA Pre-anesthesia Checklist: Patient identified, Emergency Drugs available, Suction available, Timeout performed and Patient being monitored Patient Re-evaluated:Patient Re-evaluated prior to induction Oxygen Delivery Method: Non-rebreather mask

## 2018-01-12 NOTE — Discharge Instructions (Signed)
No aspirin or NSAIDs for 1 week. Resume usual medications as before. Resume usual diet. No driving for 24 hours. Physician will call with biopsy results.   Colonoscopy, Adult, Care After This sheet gives you information about how to care for yourself after your procedure. Your health care provider may also give you more specific instructions. If you have problems or questions, contact your health care provider. What can I expect after the procedure? After the procedure, it is common to have:  A small amount of blood in your stool for 24 hours after the procedure.  Some gas.  Mild abdominal cramping or bloating. Follow these instructions at home: General instructions  For the first 24 hours after the procedure: ? Do not drive or use machinery. ? Do not sign important documents. ? Do not drink alcohol. ? Do your regular daily activities at a slower pace than normal. ? Eat soft, easy-to-digest foods.  Take over-the-counter or prescription medicines only as told by your health care provider. Relieving cramping and bloating   Try walking around when you have cramps or feel bloated.  Apply heat to your abdomen as told by your health care provider. Use a heat source that your health care provider recommends, such as a moist heat pack or a heating pad. ? Place a towel between your skin and the heat source. ? Leave the heat on for 20-30 minutes. ? Remove the heat if your skin turns bright red. This is especially important if you are unable to feel pain, heat, or cold. You may have a greater risk of getting burned. Eating and drinking   Drink enough fluid to keep your urine pale yellow.  Resume your normal diet as instructed by your health care provider. Avoid heavy or fried foods that are hard to digest.  Avoid drinking alcohol for as long as instructed by your health care provider. Contact a health care provider if:  You have blood in your stool 2-3 days after the  procedure. Get help right away if:  You have more than a small spotting of blood in your stool.  You pass large blood clots in your stool.  Your abdomen is swollen.  You have nausea or vomiting.  You have a fever.  You have increasing abdominal pain that is not relieved with medicine. Summary  After the procedure, it is common to have a small amount of blood in your stool. You may also have mild abdominal cramping and bloating.  For the first 24 hours after the procedure, do not drive or use machinery, sign important documents, or drink alcohol.  Contact your health care provider if you have a lot of blood in your stool, nausea or vomiting, a fever, or increased abdominal pain. This information is not intended to replace advice given to you by your health care provider. Make sure you discuss any questions you have with your health care provider. Document Released: 08/04/2003 Document Revised: 10/12/2016 Document Reviewed: 03/03/2015 Elsevier Interactive Patient Education  2019 [employer]  PATIENT INSTRUCTIONS POST-ANESTHESIA  IMMEDIATELY FOLLOWING SURGERY:  Do not drive or operate machinery for the first twenty four hours after surgery.  Do not make any important decisions for twenty four hours after surgery or while taking narcotic pain medications or sedatives.  If you develop intractable nausea and vomiting or a severe headache please notify your doctor immediately.  FOLLOW-UP:  Please make an appointment with your surgeon as instructed. You do not need to follow up with anesthesia unless specifically  instructed to do so.  WOUND CARE INSTRUCTIONS (if applicable):  Keep a dry clean dressing on the anesthesia/puncture wound site if there is drainage.  Once the wound has quit draining you may leave it open to air.  Generally you should leave the bandage intact for twenty four hours unless there is drainage.  If the epidural site drains for more than 36-48 hours please call the  anesthesia department.  QUESTIONS?:  Please feel free to call your physician or the hospital operator if you have any questions, and they will be happy to assist you.       Colon Polyps  Polyps are tissue growths inside the body. Polyps can grow in many places, including the large intestine (colon). A polyp may be a round bump or a mushroom-shaped growth. You could have one polyp or several. Most colon polyps are noncancerous (benign). However, some colon polyps can become cancerous over time. Finding and removing the polyps early can help prevent this. What are the causes? The exact cause of colon polyps is not known. What increases the risk? You are more likely to develop this condition if you:  Have a family history of colon cancer or colon polyps.  Are older than 12 or older than 45 if you are African American.  Have inflammatory bowel disease, such as ulcerative colitis or Crohn's disease.  Have certain hereditary conditions, such as: ? Familial adenomatous polyposis. ? Lynch syndrome. ? Turcot syndrome. ? Peutz-Jeghers syndrome.  Are overweight.  Smoke cigarettes.  Do not get enough exercise.  Drink too much alcohol.  Eat a diet that is high in fat and red meat and low in fiber.  Had childhood cancer that was treated with abdominal radiation. What are the signs or symptoms? Most polyps do not cause symptoms. If you have symptoms, they may include:  Blood coming from your rectum when having a bowel movement.  Blood in your stool. The stool may look dark red or black.  Abdominal pain.  A change in bowel habits, such as constipation or diarrhea. How is this diagnosed? This condition is diagnosed with a colonoscopy. This is a procedure in which a lighted, flexible scope is inserted into the anus and then passed into the colon to examine the area. Polyps are sometimes found when a colonoscopy is done as part of routine cancer screening tests. How is this  treated? Treatment for this condition involves removing any polyps that are found. Most polyps can be removed during a colonoscopy. Those polyps will then be tested for cancer. Additional treatment may be needed depending on the results of testing. Follow these instructions at home: Lifestyle  Maintain a healthy weight, or lose weight if recommended by your health care provider.  Exercise every day or as told by your health care provider.  Do not use any products that contain nicotine or tobacco, such as cigarettes and e-cigarettes. If you need help quitting, ask your health care provider.  If you drink alcohol, limit how much you have: ? 0-1 drink a day for women. ? 0-2 drinks a day for men.  Be aware of how much alcohol is in your drink. In the [place] one drink equals one 12 oz bottle of beer (355 mL), one 5 oz glass of wine (148 mL), or one 1 oz shot of hard liquor (44 mL). Eating and drinking   Eat foods that are high in fiber, such as fruits, vegetables, and whole grains.  Eat foods that are high in  calcium and vitamin D, such as milk, cheese, yogurt, eggs, liver, fish, and broccoli.  Limit foods that are high in fat, such as fried foods and desserts.  Limit the amount of red meat and processed meat you eat, such as hot dogs, sausage, bacon, and lunch meats. General instructions  Keep all follow-up visits as told by your health care provider. This is important. ? This includes having regularly scheduled colonoscopies. ? Talk to your health care provider about when you need a colonoscopy. Contact a health care provider if:  You have new or worsening bleeding during a bowel movement.  You have new or increased blood in your stool.  You have a change in bowel habits.  You lose weight for no known reason. Summary  Polyps are tissue growths inside the body. Polyps can grow in many places, including the colon.  Most colon polyps are noncancerous (benign), but some can  become cancerous over time.  This condition is diagnosed with a colonoscopy.  Treatment for this condition involves removing any polyps that are found. Most polyps can be removed during a colonoscopy. This information is not intended to replace advice given to you by your health care provider. Make sure you discuss any questions you have with your health care provider. Document Released: 09/16/2003 Document Revised: 04/06/2017 Document Reviewed: 04/06/2017 Elsevier Interactive Patient Education  2019 [employer].

## 2018-01-12 NOTE — Anesthesia Preprocedure Evaluation (Signed)
Anesthesia Evaluation    Airway Mallampati: II   Neck ROM: full    Dental  (+) Teeth Intact, Dental Advidsory Given   Pulmonary     + decreased breath sounds      Cardiovascular hypertension, + CAD and + Past MI   Rhythm:regular     Neuro/Psych  Neuromuscular disease    GI/Hepatic GERD  ,  Endo/Other    Renal/GU Renal disease     Musculoskeletal   Abdominal   Peds  Hematology  (+) Blood dyscrasia, anemia ,   Anesthesia Other Findings Non-hodgkins lymphoma, chemo with neuropathy CAD, MI hx NSR @ 70  Reproductive/Obstetrics                             Anesthesia Physical Anesthesia Plan  ASA: IV  Anesthesia Plan: MAC   Post-op Pain Management:    Induction:   PONV Risk Score and Plan:   Airway Management Planned:   Additional Equipment:   Intra-op Plan:   Post-operative Plan:   Informed Consent:   Dental Advisory Given  Plan Discussed with: Anesthesiologist  Anesthesia Plan Comments:         Anesthesia Quick Evaluation

## 2018-01-12 NOTE — H&P (Signed)
[name] is an 63 y.o. female.   Chief Complaint: Patient is here for colonoscopy. HPI: Patient is 63 year old Caucasian female with multiple medical problems who is here for surveillance colonoscopy.  She has history of colonic adenomas.  Last exam was in November 2014 with removal of 4 tubular adenomas.  She denies abdominal pain change in bowel habits or frank rectal bleeding.  She has hemorrhoids and may occasionally see blood in the tissue like she did yesterday after she took the prep. She does not take aspirin or other OTC NSAIDs. Family history is negative for CRC.  Past Medical History:  Diagnosis Date  . Allergic rhinitis   . Allergy   . Anemia   . Anxiety   . Asthma   . B12 deficiency 01/18/2015  . Blood transfusion without reported diagnosis   . Cataract   . Cavitary lung disease   . Chronic kidney disease    related to cancer  . Chronic sinusitis   . Clotting disorder ([place])   . COPD (chronic obstructive pulmonary disease) ([place])   . Depression   . DVT (deep venous thrombosis) ([place]) 09/25/2014  . Endotracheally intubated   . GERD (gastroesophageal reflux disease)   . Hematuria 04/07/2016  . Hyperlipidemia   . Hypertension   . Hypogammaglobulinemia, acquired ([place]) 12/30/2009   Qualifier: Diagnosis of  By: Joya Gaskins MD, Burnett Harry   . Iron deficiency anemia 01/18/2015  . Myocardial infarction ([place]) 2011  . 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 Non Hodgkin's lymphoma ([place]) 2011  . Orofacial dyskinesia 04/22/2015  . Orofacial dyskinesia 04/22/2015  . Osteoporosis   . Peripheral neuropathy due to chemotherapy ([place]) 05/02/2016  . Pneumonia 01/2013  . PONV (postoperative nausea and vomiting)   . Respiratory failure ([place])   . Sleep apnea   . SVC syndrome 09/25/2014    Past Surgical History:  Procedure Laterality Date  . ABDOMINAL HYSTERECTOMY     Fibroids  . BASAL CELL CARCINOMA EXCISION  03/2011   scalp  . BREAST SURGERY Right    biopsy  . CATARACT  EXTRACTION W/PHACO Left 11/17/2014   Procedure: CATARACT EXTRACTION PHACO AND INTRAOCULAR LENS PLACEMENT LEFT EYE;  Surgeon: Tonny Branch, MD;  Location: [facility];  Service: Ophthalmology;  Laterality: Left;  CDE:5.60  . CATARACT EXTRACTION W/PHACO Right 12/15/2014   Procedure: CATARACT EXTRACTION PHACO AND INTRAOCULAR LENS PLACEMENT RIGHT EYE CDE=5.16;  Surgeon: Tonny Branch, MD;  Location: [facility];  Service: Ophthalmology;  Laterality: Right;  . COLONOSCOPY N/A 11/14/2012   Procedure: COLONOSCOPY;  Surgeon: Rogene Houston, MD;  Location: [facility] SUITE;  Service: Endoscopy;  Laterality: N/A;  830  . EYE SURGERY    . NASAL SINUS SURGERY    . NECK SURGERY     x 2   . PERIPHERALLY INSERTED CENTRAL CATHETER INSERTION    . PICC Removal    . PORT-A-CATH REMOVAL    . PORTACATH PLACEMENT  7/11   Removed 6/12  . TRACHEOSTOMY    . VESICOVAGINAL FISTULA CLOSURE W/ TAH      Family History  Problem Relation Age of Onset  . Emphysema Mother   . Stroke Mother   . COPD Mother   . Heart disease Mother        died in sleep  71  . Allergies Father   . Asthma Father        as a child  . Arthritis Father   . Parkinson's disease Father 36  . Leukemia Maternal Grandmother   .  Cancer Maternal Grandmother        Leukemia  . Diabetes Brother   . Heart attack Brother   . Hypertension Brother   . Heart disease Brother 40       stents  . Hypertension Sister   . Hypertension Brother    Social History:  reports that she has never smoked. She has never used smokeless tobacco. She reports that she does not drink alcohol or use drugs.  Allergies:  Allergies  Allergen Reactions  . Meperidine Hcl Anaphylaxis  . Montelukast Sodium Hives and Rash    Medications Prior to Admission  Medication Sig Dispense Refill  . acetaminophen (TYLENOL) 500 MG tablet Take 1,000 mg by mouth every 6 (six) hours as needed for moderate pain or headache.     [name] Kitchen acyclovir (ZOVIRAX) 400 MG tablet Take 1 tablet (400 mg total) by  mouth 2 (two) times daily. 60 tablet 3  . albuterol (PROAIR HFA) 108 (90 Base) MCG/ACT inhaler Inhale 2 puffs into the lungs every 6 (six) hours as needed. (Patient taking differently: Inhale 2 puffs into the lungs every 6 (six) hours as needed for wheezing or shortness of breath. ) 8.5 g 2  . albuterol (PROVENTIL) (2.5 MG/3ML) 0.083% nebulizer solution Take 2.5 mg by nebulization every 4 (four) hours as needed for wheezing or shortness of breath.    [name] Kitchen alendronate (FOSAMAX) 70 MG tablet Take 1 tablet (70 mg total) by mouth once a week. Take with a full glass of water on an empty stomach. (Patient taking differently: Take 70 mg by mouth every Sunday. Take with a full glass of water on an empty stomach.) 12 tablet 3  . amphetamine-dextroamphetamine (ADDERALL) 10 MG tablet Take 10 mg by mouth daily.  0  . Armodafinil (NUVIGIL) 150 MG tablet Take 150 mg by mouth daily.    . budesonide-formoterol (SYMBICORT) 160-4.5 MCG/ACT inhaler Inhale 2 puffs into the lungs 2 (two) times daily. 1 Inhaler 5  . Cholecalciferol (VITAMIN D3 PO) Take 1 capsule by mouth at bedtime.    . citalopram (CELEXA) 20 MG tablet Take 20 mg by mouth every morning.      . cyanocobalamin 1000 MCG tablet Take 1,000 mcg by mouth daily.    . fluticasone (FLONASE) 50 MCG/ACT nasal spray Place 1 spray into both nostrils daily as needed for allergies.   10  . furosemide (LASIX) 20 MG tablet Take 1 tablet (20 mg total) by mouth as needed for fluid. 90 tablet 1  . gabapentin (NEURONTIN) 300 MG capsule Take 300 mg by mouth 4 (four) times daily.   1  . omeprazole (PRILOSEC) 20 MG capsule TAKE ONE CAPSULE BY MOUTH TWICE DAILY BEFORE A MEAL. (Patient taking differently: Take 20 mg by mouth daily. ) 180 capsule 2  . predniSONE (DELTASONE) 5 MG tablet Take 1 tablet (5 mg total) by mouth daily with breakfast. 30 tablet 5  . simvastatin (ZOCOR) 80 MG tablet Take 1 tablet (80 mg total) by mouth at bedtime. 90 tablet 3  . tiotropium (SPIRIVA) 18 MCG  inhalation capsule Place 1 capsule (18 mcg total) into inhaler and inhale daily. 30 capsule 3  . zafirlukast (ACCOLATE) 20 MG tablet Take 1 tablet (20 mg total) by mouth 2 (two) times daily. 60 tablet 1    No results found for this or any previous visit (from the past 48 hour(s)). No results found.  ROS  Blood pressure (!) 156/84, pulse 69, temperature 98 F (36.7 C), temperature source Oral, resp.  rate 16, height 5' (1.524 m), weight 61.7 kg, SpO2 94 %. Physical Exam  Constitutional: She appears well-developed and well-nourished.  HENT:  Mouth/Throat: Oropharynx is clear and moist.  Eyes: Conjunctivae are normal. No scleral icterus.  Neck: No thyromegaly present.  Cardiovascular: Normal rate, regular rhythm and normal heart sounds.  No murmur heard. Respiratory: Effort normal and breath sounds normal.  GI:  Abdomen is symmetrical.  Pfannenstiel scar present.  Abdomen is soft and nontender with organomegaly or masses.  Musculoskeletal:        General: No edema.  Lymphadenopathy:    She has no cervical adenopathy.  Neurological: She is alert.  Skin: Skin is warm and dry.     Assessment/Plan History of colonic adenomas. Surveillance colonoscopy.  Hildred Laser, MD 01/12/2018, 7:17 AM

## 2018-01-12 NOTE — Transfer of Care (Signed)
Immediate Anesthesia Transfer of Care Note  Patient: [name]  Procedure(s) Performed: COLONOSCOPY WITH PROPOFOL (N/A ) POLYPECTOMY  Patient Location: PACU  Anesthesia Type:MAC  Level of Consciousness: awake, alert , oriented and patient cooperative  Airway & Oxygen Therapy: Patient Spontanous Breathing  Post-op Assessment: Report given to RN and Post -op Vital signs reviewed and stable  Post vital signs: Reviewed and stable  Last Vitals:  Vitals Value Taken Time  BP 136/79 01/12/2018  8:15 AM  Temp    Pulse 84 01/12/2018  8:16 AM  Resp 26 01/12/2018  8:16 AM  SpO2 100 % 01/12/2018  8:16 AM  Vitals shown include unvalidated device data.  Last Pain:  Vitals:   01/12/18 0644  TempSrc: Oral  PainSc: 0-No pain      Patients Stated Pain Goal: 9 (19/14/78 2956)  Complications: No apparent anesthesia complications

## 2018-01-12 NOTE — Op Note (Signed)
[facility] Patient Name: [name] Procedure Date: 01/12/2018 7:17 AM MRN: [id number] Date of Birth: 06/12/1955 Attending MD: Hildred Laser , MD CSN: [id number] Age: 63 Admit Type: Outpatient Procedure:                Colonoscopy Indications:              High risk colon cancer surveillance: Personal                            history of colonic polyps Providers:                Hildred Laser, MD, Rosina Lowenstein, RN, Janeece Riggers, RN Referring MD:             Halford Chessman, MD Medicines:                Propofol per Anesthesia Complications:            No immediate complications. Estimated Blood Loss:     Estimated blood loss was minimal. Procedure:                Pre-Anesthesia Assessment:                           - Prior to the procedure, a History and Physical                            was performed, and patient medications and                            allergies were reviewed. The patient's tolerance of                            previous anesthesia was also reviewed. The risks                            and benefits of the procedure and the sedation                            options and risks were discussed with the patient.                            All questions were answered, and informed consent                            was obtained. Prior Anticoagulants: The patient has                            taken no previous anticoagulant or antiplatelet                            agents. ASA Grade Assessment: IV - A patient with                            severe systemic disease that is a constant threat  to life. After reviewing the risks and benefits,                            the patient was deemed in satisfactory condition to                            undergo the procedure.                           After obtaining informed consent, the colonoscope                            was passed under direct vision. Throughout the          procedure, the patient's blood pressure, pulse, and                            oxygen saturations were monitored continuously. The                            [id number]-[id number] ([id number]) scope was introduced through                            the and advanced to the the cecum, identified by                            appendiceal orifice and ileocecal valve. The                            colonoscopy was performed without difficulty. The                            patient tolerated the procedure well. The quality                            of the bowel preparation was excellent. The                            ileocecal valve, appendiceal orifice, and rectum                            were photographed. Scope In: 7:36:20 AM Scope Out: 8:07:49 AM Scope Withdrawal Time: 0 hours 25 minutes 58 seconds  Total Procedure Duration: 0 hours 31 minutes 29 seconds  Findings:      Skin tags were found on perianal exam.      Two sessile polyps were found in the hepatic flexure and ascending       colon. The polyps were small in size. These polyps were removed with a       cold snare. Resection and retrieval were complete. The pathology       specimen was placed into Bottle Number 1.      A 12 mm polyp was found in the hepatic flexure. The polyp was       multi-lobulated. The polyp was removed with a hot snare. Resection and       retrieval were  complete.      A 7 mm polyp was found in the proximal sigmoid colon. The polyp was       sessile. The polyp was removed with a hot snare. Resection and retrieval       were complete. The pathology specimen was placed into Bottle Number 1.      The retroflexed view of the distal rectum and anal verge was normal and       showed no anal or rectal abnormalities. Impression:               - Perianal skin tags found on perianal exam.                           - Two small polyps at the hepatic flexure and in                            the ascending colon, removed with  a cold snare.                            Resected and retrieved.                           - One 12 mm polyp at the hepatic flexure, removed                            with a hot snare. Resected and retrieved.                           - One 7 mm polyp in the proximal sigmoid colon,                            removed with a hot snare. Resected and retrieved. Moderate Sedation:      Per Anesthesia Care Recommendation:           - Patient has a contact number available for                            emergencies. The signs and symptoms of potential                            delayed complications were discussed with the                            patient. Return to normal activities tomorrow.                            Written discharge instructions were provided to the                            patient.                           - Resume previous diet today.                           -  Continue present medications.                           - No aspirin, ibuprofen, naproxen, or other                            non-steroidal anti-inflammatory drugs for 7 days                            after polyp removal.                           - Await pathology results.                           - Repeat colonoscopy in 5 years for surveillance. Procedure Code(s):        --- Professional ---                           [phone], Colonoscopy, flexible; with removal of                            tumor(s), polyp(s), or other lesion(s) by snare                            technique Diagnosis Code(s):        --- Professional ---                           Z86.010, Personal history of colonic polyps                           D12.3, Benign neoplasm of transverse colon (hepatic                            flexure or splenic flexure)                           D12.2, Benign neoplasm of ascending colon                           D12.5, Benign neoplasm of sigmoid colon                           K64.4, Residual  hemorrhoidal skin tags CPT copyright 2018 [employer]. All rights reserved. The codes documented in this report are preliminary and upon coder review may  be revised to meet current compliance requirements. Hildred Laser, MD Hildred Laser, MD 01/12/2018 8:17:27 AM This report has been signed electronically. Number of Addenda: 0

## 2018-01-16 ENCOUNTER — Telehealth (INDEPENDENT_AMBULATORY_CARE_PROVIDER_SITE_OTHER): Payer: Self-pay | Admitting: Internal Medicine

## 2018-01-16 ENCOUNTER — Encounter (HOSPITAL_COMMUNITY): Payer: Self-pay | Admitting: Internal Medicine

## 2018-01-16 DIAGNOSIS — H5212 Myopia, left eye: Secondary | ICD-10-CM | POA: Diagnosis not present

## 2018-01-16 NOTE — Telephone Encounter (Signed)
Patient left message regarding results from colonoscopy on 1-10 ph# [phone]

## 2018-01-17 NOTE — Telephone Encounter (Signed)
Dr.Rehman will be made aware that the patient has called for results.

## 2018-01-19 DIAGNOSIS — G4733 Obstructive sleep apnea (adult) (pediatric): Secondary | ICD-10-CM | POA: Diagnosis not present

## 2018-02-05 DIAGNOSIS — H5202 Hypermetropia, left eye: Secondary | ICD-10-CM | POA: Diagnosis not present

## 2018-02-09 DIAGNOSIS — J329 Chronic sinusitis, unspecified: Secondary | ICD-10-CM | POA: Diagnosis not present

## 2018-02-09 DIAGNOSIS — C8590 Non-Hodgkin lymphoma, unspecified, unspecified site: Secondary | ICD-10-CM | POA: Diagnosis not present

## 2018-02-09 DIAGNOSIS — N183 Chronic kidney disease, stage 3 (moderate): Secondary | ICD-10-CM | POA: Diagnosis not present

## 2018-02-09 DIAGNOSIS — Z6824 Body mass index (BMI) 24.0-24.9, adult: Secondary | ICD-10-CM | POA: Diagnosis not present

## 2018-02-20 DIAGNOSIS — L818 Other specified disorders of pigmentation: Secondary | ICD-10-CM | POA: Diagnosis not present

## 2018-02-20 DIAGNOSIS — Z85828 Personal history of other malignant neoplasm of skin: Secondary | ICD-10-CM | POA: Diagnosis not present

## 2018-02-20 DIAGNOSIS — L570 Actinic keratosis: Secondary | ICD-10-CM | POA: Diagnosis not present

## 2018-02-23 ENCOUNTER — Emergency Department (HOSPITAL_COMMUNITY): Payer: Medicare Other

## 2018-02-23 ENCOUNTER — Other Ambulatory Visit: Payer: Self-pay

## 2018-02-23 ENCOUNTER — Telehealth (HOSPITAL_COMMUNITY): Payer: Self-pay

## 2018-02-23 ENCOUNTER — Encounter (HOSPITAL_COMMUNITY): Payer: Self-pay | Admitting: Emergency Medicine

## 2018-02-23 ENCOUNTER — Emergency Department (HOSPITAL_COMMUNITY)
Admission: EM | Admit: 2018-02-23 | Discharge: 2018-02-23 | Disposition: A | Discharge status: Home / Self Care | Payer: Medicare Other | Attending: Emergency Medicine | Admitting: Emergency Medicine

## 2018-02-23 DIAGNOSIS — R109 Unspecified abdominal pain: Secondary | ICD-10-CM

## 2018-02-23 DIAGNOSIS — N189 Chronic kidney disease, unspecified: Secondary | ICD-10-CM | POA: Insufficient documentation

## 2018-02-23 DIAGNOSIS — C8590 Non-Hodgkin lymphoma, unspecified, unspecified site: Secondary | ICD-10-CM | POA: Diagnosis not present

## 2018-02-23 DIAGNOSIS — J449 Chronic obstructive pulmonary disease, unspecified: Secondary | ICD-10-CM | POA: Diagnosis not present

## 2018-02-23 DIAGNOSIS — R531 Weakness: Secondary | ICD-10-CM | POA: Diagnosis present

## 2018-02-23 DIAGNOSIS — R112 Nausea with vomiting, unspecified: Secondary | ICD-10-CM | POA: Insufficient documentation

## 2018-02-23 DIAGNOSIS — I129 Hypertensive chronic kidney disease with stage 1 through stage 4 chronic kidney disease, or unspecified chronic kidney disease: Secondary | ICD-10-CM | POA: Insufficient documentation

## 2018-02-23 DIAGNOSIS — R1032 Left lower quadrant pain: Secondary | ICD-10-CM | POA: Diagnosis not present

## 2018-02-23 DIAGNOSIS — E1122 Type 2 diabetes mellitus with diabetic chronic kidney disease: Secondary | ICD-10-CM | POA: Insufficient documentation

## 2018-02-23 DIAGNOSIS — Z79899 Other long term (current) drug therapy: Secondary | ICD-10-CM | POA: Diagnosis not present

## 2018-02-23 DIAGNOSIS — J45909 Unspecified asthma, uncomplicated: Secondary | ICD-10-CM | POA: Diagnosis not present

## 2018-02-23 DIAGNOSIS — R197 Diarrhea, unspecified: Secondary | ICD-10-CM | POA: Insufficient documentation

## 2018-02-23 LAB — COMPREHENSIVE METABOLIC PANEL
ALT: 24 U/L (ref 0–44)
AST: 33 U/L (ref 15–41)
Albumin: 3.5 g/dL (ref 3.5–5.0)
Alkaline Phosphatase: 86 U/L (ref 38–126)
Anion gap: 10 (ref 5–15)
BUN: 25 mg/dL — AB (ref 8–23)
CO2: 25 mmol/L (ref 22–32)
Calcium: 8.6 mg/dL — ABNORMAL LOW (ref 8.9–10.3)
Chloride: 98 mmol/L (ref 98–111)
Creatinine, Ser: 0.91 mg/dL (ref 0.44–1.00)
GFR calc Af Amer: >60 mL/min (ref >60)
GFR calc non Af Amer: >60 mL/min (ref >60)
Glucose, Bld: 100 mg/dL — ABNORMAL HIGH (ref 70–99)
Potassium: 3.4 mmol/L — ABNORMAL LOW (ref 3.5–5.1)
Sodium: 133 mmol/L — ABNORMAL LOW (ref 135–145)
Total Bilirubin: 0.9 mg/dL (ref 0.3–1.2)
Total Protein: 6.9 g/dL (ref 6.5–8.1)

## 2018-02-23 LAB — CBC WITH DIFFERENTIAL/PLATELET
Abs Immature Granulocytes: 0.02 10*3/uL (ref 0.00–0.07)
Basophils Absolute: 0 10*3/uL (ref 0.0–0.1)
Basophils Relative: 0 %
Eosinophils Absolute: 0 10*3/uL (ref 0.0–0.5)
Eosinophils Relative: 0 %
HCT: 42.3 % (ref 36.0–46.0)
HEMOGLOBIN: 13.4 g/dL (ref 12.0–15.0)
Immature Granulocytes: 0 %
LYMPHS ABS: 0.9 10*3/uL (ref 0.7–4.0)
Lymphocytes Relative: 16 %
MCH: 29.6 pg (ref 26.0–34.0)
MCHC: 31.7 g/dL (ref 30.0–36.0)
MCV: 93.4 fL (ref 80.0–100.0)
Monocytes Absolute: 0.4 10*3/uL (ref 0.1–1.0)
Monocytes Relative: 7 %
Neutro Abs: 4.2 10*3/uL (ref 1.7–7.7)
Neutrophils Relative %: 77 %
Platelets: 165 10*3/uL (ref 150–400)
RBC: 4.53 MIL/uL (ref 3.87–5.11)
RDW: 14.1 % (ref 11.5–15.5)
WBC: 5.6 10*3/uL (ref 4.0–10.5)
nRBC: 0 % (ref 0.0–0.2)

## 2018-02-23 LAB — URINALYSIS, ROUTINE W REFLEX MICROSCOPIC
Bacteria, UA: NONE SEEN
Bilirubin Urine: NEGATIVE
Glucose, UA: NEGATIVE mg/dL
Ketones, ur: NEGATIVE mg/dL
Nitrite: NEGATIVE
Protein, ur: NEGATIVE mg/dL
pH: 6 (ref 5.0–8.0)

## 2018-02-23 LAB — LIPASE, BLOOD: Lipase: 27 U/L (ref 11–51)

## 2018-02-23 MED ORDER — IOHEXOL 300 MG/ML  SOLN
100.0000 mL | Freq: Once | INTRAMUSCULAR | Status: AC | PRN
  Administered 2018-02-23: 100 mL via INTRAVENOUS

## 2018-02-23 MED ORDER — ONDANSETRON HCL 4 MG/2ML IJ SOLN
4.0000 mg | Freq: Once | INTRAMUSCULAR | Status: AC
  Administered 2018-02-23: 4 mg via INTRAVENOUS
  Filled 2018-02-23: qty 2

## 2018-02-23 MED ORDER — SODIUM CHLORIDE 0.9 % IV BOLUS
1000.0000 mL | Freq: Once | INTRAVENOUS | Status: AC
  Administered 2018-02-23: 1000 mL via INTRAVENOUS

## 2018-02-23 NOTE — Telephone Encounter (Signed)
Patient called concerned she may be getting "septic" again due to a history with this while receiving chemotherapy in the past.  Patient is currently receiving IVIG and the last treatment was January 09, 2018.  She stated she had a "12 hour virus with diarrhea and vomiting" during the day yesterday but was better last night.  Her temperatures ran 96 degrees yesterday and today's temperatures are running between 94.9 and 96 degrees.  The patient is using two different thermometers.  Reviewed with the nurse practitioner and instructed the patient to drink good fluids and if she should start to have chills and her temperature increases to report to the emergency room.  Patient verbalized understanding.

## 2018-02-23 NOTE — Discharge Instructions (Addendum)
You have a small density (5 mm) on your spleen.  Recommend an MRI of this area in 6 to 12 months.  Your primary care doctor can arrange this.  Otherwise tests were normal.  Increase fluids.  Tylenol for pain.  Return if worse.

## 2018-02-23 NOTE — ED Provider Notes (Signed)
[facility] EMERGENCY DEPARTMENT Provider Note   CSN: [id number] Arrival date & time: 02/23/18  1420    History   Chief Complaint Chief Complaint  Patient presents with  . Weakness    HPI [name] is a 63 y.o. female.     Nausea, vomiting, diarrhea yesterday c associated subnormal temperature.  Patient has been around an ill family member.  She receives IVIG monthly for non-Hodgkin's lymphoma.  Review of systems positive for left lower quadrant abdominal pain.  No dysuria, hematuria, vaginal bleeding or discharge.  Severity is moderate.  Nothing makes symptoms better or worse.     Past Medical History:  Diagnosis Date  . Allergic rhinitis   . Allergy   . Anemia   . Anxiety   . Asthma   . B12 deficiency 01/18/2015  . Blood transfusion without reported diagnosis   . Cataract   . Cavitary lung disease   . Chronic kidney disease    related to cancer  . Chronic sinusitis   . Clotting disorder ([place])   . COPD (chronic obstructive pulmonary disease) ([place])   . Depression   . DVT (deep venous thrombosis) ([place]) 09/25/2014  . Endotracheally intubated   . GERD (gastroesophageal reflux disease)   . Hematuria 04/07/2016  . Hyperlipidemia   . Hypertension   . Hypogammaglobulinemia, acquired ([place]) 12/30/2009   Qualifier: Diagnosis of  By: Joya Gaskins MD, Burnett Harry   . Iron deficiency anemia 01/18/2015  . Myocardial infarction ([place]) 2011  . 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 Non Hodgkin's lymphoma ([place]) 2011  . Orofacial dyskinesia 04/22/2015  . Orofacial dyskinesia 04/22/2015  . Osteoporosis   . Peripheral neuropathy due to chemotherapy ([place]) 05/02/2016  . Pneumonia 01/2013  . PONV (postoperative nausea and vomiting)   . Respiratory failure ([place])   . Sleep apnea   . SVC syndrome 09/25/2014    Patient Active Problem List   Diagnosis Date Noted  . Hx of colonic polyps 12/13/2017  . Peripheral neuropathy due to chemotherapy ([place]) 05/02/2016  . Recurrent herpes  labialis 05/02/2016  . Osteoporosis 04/30/2015  . 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 Iron deficiency anemia 01/18/2015  . B12 deficiency 01/18/2015  . SVC syndrome 09/25/2014  . Sinusitis, chronic 05/06/2014  . Abnormal finding on imaging 02/11/2014  . OSA (obstructive sleep apnea) 09/10/2012  . 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 Hypogammaglobulinemia, acquired ([place]) 12/30/2009  . Non Hodgkin's lymphoma ([place]) 09/09/2009  . HLD (hyperlipidemia) 04/21/2007  . Essential hypertension 04/21/2007  . DIABETES MELLITUS, BORDERLINE 04/21/2007  . Moderate persistent asthma 04/03/2007  . Allergic rhinitis 10/19/2006  . VOCAL CORD DISORDER 10/19/2006  . GERD 10/19/2006  . GASTRIC POLYP 07/25/2006    Past Surgical History:  Procedure Laterality Date  . ABDOMINAL HYSTERECTOMY     Fibroids  . BASAL CELL CARCINOMA EXCISION  03/2011   scalp  . BREAST SURGERY Right    biopsy  . CATARACT EXTRACTION W/PHACO Left 11/17/2014   Procedure: CATARACT EXTRACTION PHACO AND INTRAOCULAR LENS PLACEMENT LEFT EYE;  Surgeon: Tonny Branch, MD;  Location: [facility] ORS;  Service: Ophthalmology;  Laterality: Left;  CDE:5.60  . CATARACT EXTRACTION W/PHACO Right 12/15/2014   Procedure: CATARACT EXTRACTION PHACO AND INTRAOCULAR LENS PLACEMENT RIGHT EYE CDE=5.16;  Surgeon: Tonny Branch, MD;  Location: [facility] ORS;  Service: Ophthalmology;  Laterality: Right;  . COLONOSCOPY N/A 11/14/2012   Procedure: COLONOSCOPY;  Surgeon: Rogene Houston, MD;  Location: [facility] ENDO SUITE;  Service: Endoscopy;  Laterality: N/A;  830  . COLONOSCOPY WITH PROPOFOL N/A 01/12/2018   Procedure: COLONOSCOPY WITH PROPOFOL;  Surgeon: Rogene Houston, MD;  Location: [facility] END[facility] SUITE;  Service: Endoscopy;  Laterality: N/A;  7:30  . EYE SURGERY    . NASAL SINUS SURGERY    . NECK SURGERY     x 2   . PERIPHERALLY INSERTED CENTRAL CATHETER INSERTION    . PICC Removal    . POLYPECTOMY  01/12/2018   Procedure: POLYPECTOMY;   Surgeon: Rogene Houston, MD;  Location: [facility] SU[facility];  Service: Endoscopy;;  colon  . PORT-A-CATH REMOVAL    . PORTACATH PLACEMENT  7/11   Removed 6/12  . TRACHEOSTOMY    . VESICOVAGINAL FISTULA CLOSURE W/ TAH       OB History    Gravida  2   Para  2   Term  2   Preterm      AB      Living  3     SAB      TAB      Ectopic      Multiple  1   Live Births  3            Home Medications    Prior to Admission medications   Medication Sig Start Date End Date Taking? Authorizing Provider  acetaminophen (TYLENOL) 500 MG tablet Take 1,000 mg by mouth every 6 (six) hours as needed for moderate pain or headache.    Yes [provider]  acyclovir (ZOVIRAX) 400 MG tablet Take 1 tablet (400 mg total) by mouth 2 (two) times daily. 10/06/17  Yes Derek Jack, MD  albuterol (PROAIR HFA) 108 (90 Base) MCG/ACT inhaler Inhale 2 puffs into the lungs every 6 (six) hours as needed. Patient taking differently: Inhale 2 puffs into the lungs every 6 (six) hours as needed for wheezing or shortness of breath.  02/07/17  Yes Parrett, Tammy S, NP  albuterol (PROVENTIL) (2.5 MG/3ML) 0.083% nebulizer solution Take 2.5 mg by nebulization every 4 (four) hours as needed for wheezing or shortness of breath.   Yes [provider]  alendronate (FOSAMAX) 70 MG tablet Take 1 tablet (70 mg total) by mouth once a week. Take with a full glass of water on an empty stomach. Patient taking differently: Take 70 mg by mouth every Sunday. Take with a full glass of water on an empty stomach. 05/02/16  Yes [name], MD  amphetamine-dextroamphetamine (ADDERALL) 10 MG tablet Take 10 mg by mouth every morning.  04/13/15  Yes [provider]  Armodafinil (NUVIGIL) 150 MG tablet Take 150 mg by mouth every morning.    Yes [provider]  budesonide-formoterol (SYMBICORT) 160-4.5 MCG/ACT inhaler Inhale 2 puffs into the lungs 2 (two) times daily. 01/09/18  Yes Collene Gobble, MD  Cholecalciferol (VITAMIN D3 PO) Take 1 capsule by mouth at bedtime.   Yes [provider]  citalopram (CELEXA) 20 MG tablet Take 20 mg by mouth every morning.     Yes [provider]  cyanocobalamin 1000 MCG tablet Take 1,000 mcg by mouth daily.   Yes [provider]  furosemide (LASIX) 20 MG tablet Take 1 tablet (20 mg total) by mouth as needed for fluid. 05/02/16  Yes [name], MD  gabapentin (NEURONTIN) 300 MG capsule Take 300 mg by mouth 2 (two) times daily.  09/15/16  Yes [provider]  omeprazole (PRILOSEC) 20 MG capsule TAKE ONE CAPSULE BY MOUTH TWICE DAILY BEFORE  A MEAL. Patient taking differently: Take 20 mg by mouth 2 (two) times daily.  08/22/17  Yes Parrett, Tammy S, NP  predniSONE (DELTASONE) 5 MG tablet Take 1 tablet (5 mg total) by mouth daily with breakfast. 03/29/17  Yes Parrett, Tammy S, NP  simvastatin (ZOCOR) 80 MG tablet Take 1 tablet (80 mg total) by mouth at bedtime. 01/19/17  Yes [name], MD  tiotropium (SPIRIVA) 18 MCG inhalation capsule Place 1 capsule (18 mcg total) into inhaler and inhale daily. 01/09/18  Yes Collene Gobble, MD  zafirlukast (ACCOLATE) 20 MG tablet Take 1 tablet (20 mg total) by mouth 2 (two) times daily. Patient taking differently: Take 20 mg by mouth at bedtime.  01/09/18  Yes Collene Gobble, MD  doxycycline (VIBRAMYCIN) 100 MG capsule Take 100 mg by mouth 2 (two) times daily. 10 day course starting on 02/09/2018-COMPLETED COURSE 02/09/18   [provider]    Family History Family History  Problem Relation Age of Onset  . Emphysema Mother   . Stroke Mother   . COPD Mother   . Heart disease Mother        died in sleep  24  . Allergies Father   . Asthma Father        as a child  . Arthritis Father   . Parkinson's disease Father 107  . Leukemia Maternal Grandmother   . Cancer Maternal Grandmother        Leukemia  . Diabetes Brother   . Heart attack Brother   . Hypertension  Brother   . Heart disease Brother 40       stents  . Hypertension Sister   . Hypertension Brother     Social History Social History   Tobacco Use  . Smoking status: Never Smoker  . Smokeless tobacco: Never Used  Substance Use Topics  . Alcohol use: No  . Drug use: No     Allergies   Meperidine hcl and Montelukast sodium   Review of Systems Review of Systems  All other systems reviewed and are negative.    Physical Exam Updated Vital Signs BP 140/89   Pulse 71   Temp 98.1 F (36.7 C) (Oral)   Resp 18   Ht 5' (1.524 m)   Wt 61.7 kg   SpO2 94%   BMI 26.56 kg/m   Physical Exam Vitals signs and nursing note reviewed.  Constitutional:      Appearance: She is well-developed.     Comments: nad  HENT:     Head: Normocephalic and atraumatic.  Eyes:     Conjunctiva/sclera: Conjunctivae normal.  Neck:     Musculoskeletal: Neck supple.  Cardiovascular:     Rate and Rhythm: Normal rate and regular rhythm.  Pulmonary:     Effort: Pulmonary effort is normal.     Breath sounds: Normal breath sounds.  Abdominal:     General: Bowel sounds are normal.     Palpations: Abdomen is soft.     Comments: Normal tenderness left mid anterior lateral abdomen.  Musculoskeletal: Normal range of motion.  Skin:    General: Skin is warm and dry.  Neurological:     Mental Status: She is alert and oriented to person, place, and time.  Psychiatric:        Behavior: Behavior normal.      ED Treatments / Results  Labs (all labs ordered are listed, but only abnormal results are displayed) Labs Reviewed  COMPREHENSIVE METABOLIC PANEL - Abnormal;  Notable for the following components:      Result Value   Sodium 133 (*)    Potassium 3.4 (*)    Glucose, Bld 100 (*)    BUN 25 (*)    Calcium 8.6 (*)    All other components within normal limits  URINALYSIS, ROUTINE W REFLEX MICROSCOPIC - Abnormal; Notable for the following components:   Specific Gravity, Urine >1.046 (*)    Hgb  urine dipstick MODERATE (*)    Leukocytes,Ua SMALL (*)    All other components within normal limits  CBC WITH DIFFERENTIAL/PLATELET  LIPASE, BLOOD    EKG None  Radiology Ct Abdomen Pelvis W Contrast  Result Date: 02/23/2018 CLINICAL DATA:  Left lower quadrant pain EXAM: CT ABDOMEN AND PELVIS WITH CONTRAST TECHNIQUE: Multidetector CT imaging of the abdomen and pelvis was performed using the standard protocol following bolus administration of intravenous contrast. CONTRAST:  149mL OMNIPAQUE IOHEXOL 300 MG/ML  SOLN COMPARISON:  CT 11/26/2014, PET-CT 06/24/2013 FINDINGS: Lower chest: Lung bases demonstrate minimal bronchiectasis in the lingula with mild scarring at the right middle lobe and lingula. No acute consolidation or effusion. The heart size is normal. Hepatobiliary: No calcified gallstone. Stable hypodense lesions within the right hepatic lobe. No biliary dilatation Pancreas: Unremarkable. No pancreatic ductal dilatation or surrounding inflammatory changes. Spleen: Subcentimeter circumscribed hypodensity within the anterior spleen measuring 5 mm, not seen on prior studies. Adrenals/Urinary Tract: Adrenal glands are normal. No hydronephrosis. Atrophic left kidney with cortical scarring. Bladder is normal Stomach/Bowel: Stomach is nonenlarged. No dilated small bowel. No colon wall thickening. Vascular/Lymphatic: Nonaneurysmal aorta. Moderate aortic atherosclerosis. No significantly enlarged lymph nodes. Numerous chest and abdominal wall collateral vessels, chronic. Reproductive: Status post hysterectomy. No adnexal masses. Other: Negative for free air or free fluid. Musculoskeletal: No acute or suspicious abnormality. IMPRESSION: 1. No CT evidence for acute intra-abdominal or pelvic abnormality. 2. New 5 mm probably benign hypodensity within the anterior spleen. Suggest 6-12 month MRI follow-up to document stability. 3. Atrophic left kidney with cortical scarring. Electronically Signed   By: Donavan Foil M.D.   On: 02/23/2018 19:15    Procedures Procedures (including critical care time)  Medications Ordered in ED Medications  sodium chloride 0.9 % bolus 1,000 mL ( Intravenous Stopped 02/23/18 1735)  ondansetron (ZOFRAN) injection 4 mg (4 mg Intravenous Given 02/23/18 1617)  iohexol (OMNIPAQUE) 300 MG/ML solution 100 mL (100 mLs Intravenous Contrast Given 02/23/18 1812)     Initial Impression / Assessment and Plan / ED Course  I have reviewed the triage vital signs and the nursing notes.  Pertinent labs & imaging results that were available during my care of the patient were reviewed by me and considered in my medical decision making (see chart for details).        Patient presents with nausea, vomiting, diarrhea, suboptimal temperature.  CT scan of abdomen/pelvis revealed no acute findings.  Patient feels much better after IV fluids and IV Zofran.  Will culture urine.  Discussed hypodensity on spleen with patient.  She understands to follow-up with an MRI in 6 to 12 months.  Final Clinical Impressions(s) / ED Diagnoses   Final diagnoses:  Abdominal pain, unspecified abdominal location    ED Discharge Orders    None       Nat Christen, MD 02/23/18 2029

## 2018-02-23 NOTE — ED Triage Notes (Signed)
PT c/o generalized weakness/mailaise, nausea/vomiting diarrhea and temperature reading low at home x2 days. PT's family reports pt has been around someone with stomach virus and Mono recently. PT is treated in the cancer center and received IVIG monthly.

## 2018-02-25 LAB — URINE CULTURE: Culture: <10000 — AB

## 2018-03-05 ENCOUNTER — Other Ambulatory Visit (HOSPITAL_COMMUNITY): Payer: Medicare Other

## 2018-03-05 ENCOUNTER — Inpatient Hospital Stay (HOSPITAL_COMMUNITY): Payer: Medicare Other | Attending: Hematology

## 2018-03-05 DIAGNOSIS — Z8572 Personal history of non-Hodgkin lymphomas: Secondary | ICD-10-CM | POA: Insufficient documentation

## 2018-03-05 DIAGNOSIS — D801 Nonfamilial hypogammaglobulinemia: Secondary | ICD-10-CM | POA: Diagnosis present

## 2018-03-05 DIAGNOSIS — Z79899 Other long term (current) drug therapy: Secondary | ICD-10-CM | POA: Diagnosis not present

## 2018-03-05 DIAGNOSIS — Z9221 Personal history of antineoplastic chemotherapy: Secondary | ICD-10-CM | POA: Insufficient documentation

## 2018-03-05 DIAGNOSIS — C8338 Diffuse large B-cell lymphoma, lymph nodes of multiple sites: Secondary | ICD-10-CM

## 2018-03-05 LAB — COMPREHENSIVE METABOLIC PANEL
ALT: 25 U/L (ref 0–44)
AST: 23 U/L (ref 15–41)
Albumin: 3.8 g/dL (ref 3.5–5.0)
Alkaline Phosphatase: 101 U/L (ref 38–126)
Anion gap: 10 (ref 5–15)
BUN: 12 mg/dL (ref 8–23)
CO2: 28 mmol/L (ref 22–32)
Calcium: 8.8 mg/dL — ABNORMAL LOW (ref 8.9–10.3)
Chloride: 98 mmol/L (ref 98–111)
Creatinine, Ser: 0.82 mg/dL (ref 0.44–1.00)
GFR calc non Af Amer: >60 mL/min (ref >60)
Glucose, Bld: 136 mg/dL — ABNORMAL HIGH (ref 70–99)
Potassium: 3.5 mmol/L (ref 3.5–5.1)
Sodium: 136 mmol/L (ref 135–145)
Total Bilirubin: 0.8 mg/dL (ref 0.3–1.2)
Total Protein: 7 g/dL (ref 6.5–8.1)

## 2018-03-05 LAB — CBC WITH DIFFERENTIAL/PLATELET
Abs Immature Granulocytes: 0.02 10*3/uL (ref 0.00–0.07)
Basophils Absolute: 0 10*3/uL (ref 0.0–0.1)
Basophils Relative: 0 %
Eosinophils Absolute: 0 10*3/uL (ref 0.0–0.5)
Eosinophils Relative: 0 %
HCT: 39.7 % (ref 36.0–46.0)
HEMOGLOBIN: 12.2 g/dL (ref 12.0–15.0)
Immature Granulocytes: 0 %
Lymphocytes Relative: 7 %
Lymphs Abs: 0.7 10*3/uL (ref 0.7–4.0)
MCH: 28.8 pg (ref 26.0–34.0)
MCHC: 30.7 g/dL (ref 30.0–36.0)
MCV: 93.9 fL (ref 80.0–100.0)
Monocytes Absolute: 0.5 10*3/uL (ref 0.1–1.0)
Monocytes Relative: 5 %
Neutro Abs: 9.4 10*3/uL — ABNORMAL HIGH (ref 1.7–7.7)
Neutrophils Relative %: 88 %
Platelets: 153 10*3/uL (ref 150–400)
RBC: 4.23 MIL/uL (ref 3.87–5.11)
RDW: 14.1 % (ref 11.5–15.5)
WBC: 10.7 10*3/uL — AB (ref 4.0–10.5)
nRBC: 0 % (ref 0.0–0.2)

## 2018-03-05 LAB — LACTATE DEHYDROGENASE: LDH: 181 U/L (ref 98–192)

## 2018-03-06 ENCOUNTER — Inpatient Hospital Stay (HOSPITAL_COMMUNITY): Payer: Medicare Other

## 2018-03-06 ENCOUNTER — Inpatient Hospital Stay (HOSPITAL_BASED_OUTPATIENT_CLINIC_OR_DEPARTMENT_OTHER): Payer: Medicare Other | Admitting: Nurse Practitioner

## 2018-03-06 VITALS — BP 173/87 | HR 59 | Temp 98.6°F | Resp 16

## 2018-03-06 DIAGNOSIS — Z79899 Other long term (current) drug therapy: Secondary | ICD-10-CM

## 2018-03-06 DIAGNOSIS — Z8572 Personal history of non-Hodgkin lymphomas: Secondary | ICD-10-CM

## 2018-03-06 DIAGNOSIS — D801 Nonfamilial hypogammaglobulinemia: Secondary | ICD-10-CM | POA: Diagnosis not present

## 2018-03-06 DIAGNOSIS — C8338 Diffuse large B-cell lymphoma, lymph nodes of multiple sites: Secondary | ICD-10-CM

## 2018-03-06 DIAGNOSIS — Z9221 Personal history of antineoplastic chemotherapy: Secondary | ICD-10-CM | POA: Diagnosis not present

## 2018-03-06 LAB — IGG, IGA, IGM
IGG (IMMUNOGLOBIN G), SERUM: 1197 mg/dL (ref 700–1600)
IgA: <5 mg/dL — ABNORMAL LOW (ref 87–352)
IgM (Immunoglobulin M), Srm: 82 mg/dL (ref 26–217)

## 2018-03-06 MED ORDER — DIPHENHYDRAMINE HCL 25 MG PO CAPS
25.0000 mg | ORAL_CAPSULE | Freq: Once | ORAL | Status: DC
  Filled 2018-03-06: qty 1

## 2018-03-06 MED ORDER — IMMUNE GLOBULIN (HUMAN) 10 GM/100ML IV SOLN
25.0000 g | Freq: Once | INTRAVENOUS | Status: AC
  Administered 2018-03-06: 25 g via INTRAVENOUS
  Filled 2018-03-06: qty 100

## 2018-03-06 MED ORDER — DEXTROSE 5 % IV SOLN
INTRAVENOUS | Status: DC
  Administered 2018-03-06: 10:00:00 via INTRAVENOUS

## 2018-03-06 MED ORDER — ACETAMINOPHEN 325 MG PO TABS: 650.0000 mg | ORAL_TABLET | Freq: Four times a day (QID) | ORAL | Status: DC | PRN

## 2018-03-06 NOTE — Progress Notes (Signed)
[name],  [id number]   CLINIC:  Medical Oncology/Hematology  PCP:  Sharilyn Sites, MD [address] [place] [address] [phone]   REASON FOR VISIT: Follow-up for hypogammaglobulinemiaANDnon-hodgkin's [place] every 8 weeks AND observation  BRIEF ONCOLOGIC HISTORY:    Non Hodgkin's lymphoma ([place])   09/09/2009 Initial Diagnosis    Non Hodgkin's lymphoma ([place])      CANCER STAGING: Cancer Staging Non Hodgkin's lymphoma ([place]) Staging form: Lymphoid Neoplasms, AJCC 6th Edition - Clinical: Stage IV - Signed by Baird Cancer, PA on 11/05/2010    INTERVAL HISTORY:  [name] 63 y.o. female returns for routine follow-up for hypogammaglobulinemiaandnon-hodgkin's lymphoma.  She reports an urgent care visit first week in February for a sinus infection requiring antibiotics.  She also had an ER visit 02/23/2018 nausea vomiting diarrhea due to a virus she did not receive antibiotics at this time just IV fluids.  She reports no other infections since her last IVIG infusion.  She reports they saw a nodule on her spleen with her last ER visit and that she needs an MRI in 6 months.  She denies any abdominal pain at this time. Denies any nausea, vomiting, or diarrhea. Denies any new pains. Had not noticed any recent bleeding such as epistaxis, hematuria or hematochezia. Denies recent chest pain on exertion, shortness of breath on minimal exertion, pre-syncopal episodes, or palpitations. Denies any numbness or tingling in hands or feet. Denies any recent fevers, infections, or recent hospitalizations. Patient reports appetite at 75% and energy level at 50%. She is eating well and maintaining her weight at this time.     REVIEW OF SYSTEMS:  Review of Systems  Respiratory: Positive for wheezing (at times).   All other systems reviewed and are negative.    PAST MEDICAL/SURGICAL HISTORY:  Past Medical  History:  Diagnosis Date  . Allergic rhinitis   . Allergy   . Anemia   . Anxiety   . Asthma   . B12 deficiency 01/18/2015  . Blood transfusion without reported diagnosis   . Cataract   . Cavitary lung disease   . Chronic kidney disease    related to cancer  . Chronic sinusitis   . Clotting disorder ([place])   . COPD (chronic obstructive pulmonary disease) ([place])   . Depression   . DVT (deep venous thrombosis) ([place]) 09/25/2014  . Endotracheally intubated   . GERD (gastroesophageal reflux disease)   . Hematuria 04/07/2016  . Hyperlipidemia   . Hypertension   . Hypogammaglobulinemia, acquired ([place]) 12/30/2009   Qualifier: Diagnosis of  By: Joya Gaskins MD, Burnett Harry   . Iron deficiency anemia 01/18/2015  . Myocardial infarction ([place]) 2011  . 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 Non Hodgkin's lymphoma ([place]) 2011  . Orofacial dyskinesia 04/22/2015  . Orofacial dyskinesia 04/22/2015  . Osteoporosis   . Peripheral neuropathy due to chemotherapy ([place]) 05/02/2016  . Pneumonia 01/2013  . PONV (postoperative nausea and vomiting)   . Respiratory failure ([place])   . Sleep apnea   . SVC syndrome 09/25/2014   Past Surgical History:  Procedure Laterality Date  . ABDOMINAL HYSTERECTOMY     Fibroids  . BASAL CELL CARCINOMA EXCISION  03/2011   scalp  . BREAST SURGERY Right    biopsy  . CATARACT EXTRACTION W/PHACO Left 11/17/2014   Procedure: CATARACT EXTRACTION PHACO AND INTRAOCULAR LENS PLACEMENT LEFT EYE;  Surgeon: Tonny Branch, MD;  Location: [facility];  Service: Ophthalmology;  Laterality: Left;  CDE:5.60  . CATARACT EXTRACTION W/PHACO Right 12/15/2014   Procedure: CATARACT EXTRACTION PHACO AND INTRAOCULAR LENS PLACEMENT RIGHT EYE CDE=5.16;  Surgeon: Tonny Branch, MD;  Location: [facility];  Service: Ophthalmology;  Laterality: Right;  . COLONOSCOPY N/A 11/14/2012   Procedure: COLONOSCOPY;  Surgeon: Rogene Houston, MD;  Location: [facility];  Service: Endoscopy;  Laterality: N/A;  830  .  COLONOSCOPY WITH PROPOFOL N/A 01/12/2018   Procedure: COLONOSCOPY WITH PROPOFOL;  Surgeon: Rogene Houston, MD;  Location: [facility];  Service: Endoscopy;  Laterality: N/A;  7:30  . EYE SURGERY    . NASAL SINUS SURGERY    . NECK SURGERY     x 2   . PERIPHERALLY INSERTED CENTRAL CATHETER INSERTION    . PICC Removal    . POLYPECTOMY  01/12/2018   Procedure: POLYPECTOMY;  Surgeon: Rogene Houston, MD;  Location: [facility];  Service: Endoscopy;;  colon  . PORT-A-CATH REMOVAL    . PORTACATH PLACEMENT  7/11   Removed 6/12  . TRACHEOSTOMY    . VESICOVAGINAL FISTULA CLOSURE W/ TAH       SOCIAL HISTORY:  Social History   Socioeconomic History  . Marital status: Divorced    Spouse name: Not on file  . Number of children: 3  . Years of education: 61  . Highest education level: Not on file  Occupational History  . Occupation: Estate agent    Comment: [employer]  . Financial resource strain: Not hard at all  . Food insecurity:    Worry: Never true    Inability: Never true  . Transportation needs:    Medical: No    Non-medical: No  Tobacco Use  . Smoking status: Never Smoker  . Smokeless tobacco: Never Used  Substance and Sexual Activity  . Alcohol use: No  . Drug use: No  . Sexual activity: Not Currently    Birth control/protection: Surgical    Comment: hyst  Lifestyle  . Physical activity:    Days per week: 0 days    Minutes per session: 0 min  . Stress: Not at all  Relationships  . Social connections:    Talks on phone: More than three times a week    Gets together: More than three times a week    Attends religious service: More than 4 times per year    Active member of club or organization: Yes    Attends meetings of clubs or organizations: More than 4 times per year    Relationship status: Divorced  . Intimate partner violence:    Fear of current or ex partner: No    Emotionally abused: No    Physically abused: No    Forced sexual activity:  No  Other Topics Concern  . Not on file  Social History Narrative   Originally from [place]. Always lived in [place]. Prior travel to [place]. 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 She has 1 dog currently. She has 1 conures (small parrots). No mold exposure in her home. 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 No hot tub exposure. She enjoys sewing & quilting.     FAMILY HISTORY:  Family History  Problem Relation Age of Onset  . Emphysema Mother   . Stroke Mother   . COPD Mother   . Heart disease Mother  died in sleep  28  . Allergies Father   . Asthma Father        as a child  . Arthritis Father   . Parkinson's disease Father 4  . Leukemia Maternal Grandmother   . Cancer Maternal Grandmother        Leukemia  . Diabetes Brother   . Heart attack Brother   . Hypertension Brother   . Heart disease Brother 40       stents  . Hypertension Sister   . Hypertension Brother     CURRENT MEDICATIONS:  Outpatient Encounter Medications as of 03/06/2018  Medication Sig  . acetaminophen (TYLENOL) 500 MG tablet Take 1,000 mg by mouth every 6 (six) hours as needed for moderate pain or headache.   [name] Kitchen acyclovir (ZOVIRAX) 400 MG tablet Take 1 tablet (400 mg total) by mouth 2 (two) times daily.  [name] Kitchen albuterol (PROAIR HFA) 108 (90 Base) MCG/ACT inhaler Inhale 2 puffs into the lungs every 6 (six) hours as needed. (Patient taking differently: Inhale 2 puffs into the lungs every 6 (six) hours as needed for wheezing or shortness of breath. )  . albuterol (PROVENTIL) (2.5 MG/3ML) 0.083% nebulizer solution Take 2.5 mg by nebulization every 4 (four) hours as needed for wheezing or shortness of breath.  [name] Kitchen alendronate (FOSAMAX) 70 MG tablet Take 1 tablet (70 mg total) by mouth once a week. Take with a full glass of water on an empty stomach. (Patient taking differently: Take 70 mg by mouth every Sunday. Take with a full glass of water on an  empty stomach.)  . amphetamine-dextroamphetamine (ADDERALL) 10 MG tablet Take 10 mg by mouth every morning.   . Armodafinil (NUVIGIL) 150 MG tablet Take 150 mg by mouth every morning.   . budesonide-formoterol (SYMBICORT) 160-4.5 MCG/ACT inhaler Inhale 2 puffs into the lungs 2 (two) times daily.  . Cholecalciferol (VITAMIN D3 PO) Take 1 capsule by mouth at bedtime.  . citalopram (CELEXA) 20 MG tablet Take 20 mg by mouth every morning.    . cyanocobalamin 1000 MCG tablet Take 1,000 mcg by mouth daily.  [name] Kitchen doxycycline (VIBRAMYCIN) 100 MG capsule Take 100 mg by mouth 2 (two) times daily. 10 day course starting on 02/09/2018-COMPLETED COURSE  . furosemide (LASIX) 20 MG tablet Take 1 tablet (20 mg total) by mouth as needed for fluid.  [name] Kitchen gabapentin (NEURONTIN) 300 MG capsule Take 300 mg by mouth 2 (two) times daily.   [name] Kitchen omeprazole (PRILOSEC) 20 MG capsule TAKE ONE CAPSULE BY MOUTH TWICE DAILY BEFORE A MEAL. (Patient taking differently: Take 20 mg by mouth 2 (two) times daily. )  . predniSONE (DELTASONE) 5 MG tablet Take 1 tablet (5 mg total) by mouth daily with breakfast.  . simvastatin (ZOCOR) 80 MG tablet Take 1 tablet (80 mg total) by mouth at bedtime.  [name] Kitchen tiotropium (SPIRIVA) 18 MCG inhalation capsule Place 1 capsule (18 mcg total) into inhaler and inhale daily.  . zafirlukast (ACCOLATE) 20 MG tablet Take 1 tablet (20 mg total) by mouth 2 (two) times daily. (Patient taking differently: Take 20 mg by mouth at bedtime. )   Facility-Administered Encounter Medications as of 03/06/2018  Medication  . 0.9 %  sodium chloride infusion  . 0.9 %  sodium chloride infusion  . acetaminophen (TYLENOL) tablet 650 mg  . acetaminophen (TYLENOL) tablet 650 mg  . dextrose 5 % solution  . dextrose 5 % solution  . dextrose 5 % solution  . diphenhydrAMINE (BENADRYL) capsule 25 mg  .  diphenhydrAMINE (BENADRYL) capsule 25 mg  . sodium chloride 0.9 % injection 10 mL    ALLERGIES:  Allergies  Allergen Reactions  .  Meperidine Hcl Anaphylaxis  . Montelukast Sodium Hives and Rash     PHYSICAL EXAM:  ECOG Performance status: 1  Vitals:   03/06/18 0901  BP: (!) 156/86  Pulse: (!) 59  Resp: 18  Temp: 97.6 F (36.4 C)  SpO2: 97%   Filed Weights   03/06/18 0901  Weight: 137 lb (62.1 kg)    Physical Exam Constitutional:      Appearance: Normal appearance. She is normal weight.  Neck:     Musculoskeletal: Normal range of motion and neck supple.  Cardiovascular:     Rate and Rhythm: Normal rate and regular rhythm.     Heart sounds: Normal heart sounds.  Pulmonary:     Breath sounds: Wheezing present.  Abdominal:     General: Abdomen is flat. Bowel sounds are normal.     Palpations: Abdomen is soft.  Musculoskeletal: Normal range of motion.  Skin:    General: Skin is warm and dry.  Neurological:     Mental Status: She is alert and oriented to person, place, and time. Mental status is at baseline.  Psychiatric:        Mood and Affect: Mood normal.        Behavior: Behavior normal.        Thought Content: Thought content normal.        Judgment: Judgment normal.      LABORATORY DATA:  I have reviewed the labs as listed.  CBC    Component Value Date/Time   WBC 10.7 (H) 03/05/2018 0939   RBC 4.23 03/05/2018 0939   HGB 12.2 03/05/2018 0939   HCT 39.7 03/05/2018 0939   PLT 153 03/05/2018 0939   MCV 93.9 03/05/2018 0939   MCH 28.8 03/05/2018 0939   MCHC 30.7 03/05/2018 0939   RDW 14.1 03/05/2018 0939   LYMPHSABS 0.7 03/05/2018 0939   MONOABS 0.5 03/05/2018 0939   EOSABS 0.0 03/05/2018 0939   BASOSABS 0.0 03/05/2018 0939   CMP Latest Ref Rng & Units 03/05/2018 02/23/2018 01/02/2018  Glucose 70 - 99 mg/dL 136(H) 100(H) 110(H)  BUN 8 - 23 mg/dL 12 25(H) 12  Creatinine 0.44 - 1.00 mg/dL 0.82 0.91 0.77  Sodium 135 - 145 mmol/L 136 133(L) 137  Potassium 3.5 - 5.1 mmol/L 3.5 3.4(L) 3.7  Chloride 98 - 111 mmol/L 98 98 100  CO2 22 - 32 mmol/L 28 25 29   Calcium 8.9 - 10.3 mg/dL  8.8(L) 8.6(L) 8.7(L)  Total Protein 6.5 - 8.1 g/dL 7.0 6.9 7.2  Total Bilirubin 0.3 - 1.2 mg/dL 0.8 0.9 0.4  Alkaline Phos 38 - 126 U/L 101 86 92  AST 15 - 41 U/L 23 33 18  ALT 0 - 44 U/L 25 24 14        DIAGNOSTIC IMAGING:  I have independently reviewed the scans and discussed with the patient.   I personally performed a face-to-face visit, made revisions and my assessment and plan is as follows.    ASSESSMENT & PLAN:   Non Hodgkin's lymphoma ([place]) 1. Acquired hypogammaglobulinemia: -She started off receiving IVIG every 4 weeks.  It was switched to every 6 weeks in July 2019. We now switched her to every 8 weeks.  She has been tolerating this very well. -She takes 5 mg of prednisone daily for her breathing. - Last IVIG was given 01/09/2018. -She was  seen at her primary care doctor the first week in February and received antibiotics for sinus infection.  She also had one trip to the ER 02/23/2018 for IV fluids due to a virus. - Her blood work was reviewed and her IgG trough level was 1,197. - She will receive IVIG infusion today. -We will see her back in 8 weeks for follow-up with labs.  2. Diffuse large B-cell lymphoma, stage IVb: -She was diagnosed 07/08/2009.  She was treated with 4 cycles of R-CHOP with intrathecal methotrexate during cycles 3 and 4.she developed pseudomonas sepsis after cycles 2 and 4 and therapy was stopped after 4 cycles.  She also received 2 years of maintenance rituximab. - PET CT scan on 03/20/2017 did not show any evidence of lymphoma. - Today's exam did not reveal any palpable adenopathy or splenomegaly. - LDH was 181.  We will do scans of clinical conditions dictates.  3. Left lung nodule: - PET/CT from 03/2017 revealed no evidence of lung nodule.  4. Nodule on spleen: - CT of abdomen pelvis on 02/23/2018 revealed a new 5 mm probably benign hypodensity within the anterior spleen. - It suggest 6 to 51-month MRI follow-up to document stability. - Denies  any abdominal pain.  No splenomegaly on exam.  5. Health maintenance: - She had her mammogram on 09/08/2017 which was BI-RADS 1.      Orders placed this encounter:  No orders of the defined types were placed in this encounter.     Derek Jack, MD [place] [phone]

## 2018-03-06 NOTE — Progress Notes (Signed)
Treatment given per orders. Patient tolerated it well without problems. Vitals stable and discharged home from clinic ambulatory. Follow up as scheduled.  

## 2018-03-06 NOTE — Assessment & Plan Note (Signed)
1. Acquired hypogammaglobulinemia: -She started off receiving IVIG every 4 weeks.  It was switched to every 6 weeks in July 2019. We now switched her to every 8 weeks.  She has been tolerating this very well. -She takes 5 mg of prednisone daily for her breathing. - Last IVIG was given 01/09/2018. -She was seen at her primary care doctor the first week in February and received antibiotics for sinus infection.  She also had one trip to the ER 02/23/2018 for IV fluids due to a virus. - Her blood work was reviewed and her IgG trough level was 1,197. - She will receive IVIG infusion today. -We will see her back in 8 weeks for follow-up with labs.  2. Diffuse large B-cell lymphoma, stage IVb: -She was diagnosed 07/08/2009.  She was treated with 4 cycles of R-CHOP with intrathecal methotrexate during cycles 3 and 4.she developed pseudomonas sepsis after cycles 2 and 4 and therapy was stopped after 4 cycles.  She also received 2 years of maintenance rituximab. - PET CT scan on 03/20/2017 did not show any evidence of lymphoma. - Today's exam did not reveal any palpable adenopathy or splenomegaly. - LDH was 181.  We will do scans of clinical conditions dictates.  3. Left lung nodule: - PET/CT from 03/2017 revealed no evidence of lung nodule.  4. Nodule on spleen: - CT of abdomen pelvis on 02/23/2018 revealed a new 5 mm probably benign hypodensity within the anterior spleen. - It suggest 6 to 57-month MRI follow-up to document stability. - Denies any abdominal pain.  No splenomegaly on exam.  5. Health maintenance: - She had her mammogram on 09/08/2017 which was BI-RADS 1.

## 2018-03-06 NOTE — Patient Instructions (Signed)
[place] at [facility] Discharge Instructions  Follow up in 8 weeks with Labs and IVIG.    Thank you for choosing [place] at [facility] to provide your oncology and hematology care.  To afford each patient quality time with our provider, please arrive at least 15 minutes before your scheduled appointment time.   If you have a lab appointment with the [facility] please come in thru the  Main Entrance and check in at the main information desk  You need to re-schedule your appointment should you arrive 10 or more minutes late.  We strive to give you quality time with our providers, and arriving late affects you and other patients whose appointments are after yours.  Also, if you no show three or more times for appointments you may be dismissed from the clinic at the providers discretion.     Again, thank you for choosing [facility].  Our hope is that these requests will decrease the amount of time that you wait before being seen by our physicians.       _____________________________________________________________  Should you have questions after your visit to [facility], please contact our office at [phone] between the hours of 8:00 a.m. and 4:30 p.m.  Voicemails left after 4:00 p.m. will not be returned until the following business day.  For prescription refill requests, have your pharmacy contact our office and allow 72 hours.    Cancer Center Support Programs:   > Cancer Support Group  2nd Tuesday of the month 1pm-2pm, Journey Room

## 2018-03-06 NOTE — Patient Instructions (Signed)
[place] at [facility]  Discharge Instructions:  IVIG today. _______________________________________________________________  Thank you for choosing [place] at [facility] to provide your oncology and hematology care.  To afford each patient quality time with our providers, please arrive at least 15 minutes before your scheduled appointment.  You need to re-schedule your appointment if you arrive 10 or more minutes late.  We strive to give you quality time with our providers, and arriving late affects you and other patients whose appointments are after yours.  Also, if you no show three or more times for appointments you may be dismissed from the clinic.  Again, thank you for choosing [place] at [place] hope is that these requests will allow you access to exceptional care and in a timely manner. _______________________________________________________________  If you have questions after your visit, please contact our office at [phone] between the hours of 8:30 a.m. and 5:00 p.m. Voicemails left after 4:30 p.m. will not be returned until the following business day. _______________________________________________________________  For prescription refill requests, have your pharmacy contact our office. _______________________________________________________________  Recommendations made by the consultant and any test results will be sent to your referring physician. _______________________________________________________________

## 2018-03-15 DIAGNOSIS — Z6824 Body mass index (BMI) 24.0-24.9, adult: Secondary | ICD-10-CM | POA: Diagnosis not present

## 2018-03-15 DIAGNOSIS — B349 Viral infection, unspecified: Secondary | ICD-10-CM | POA: Diagnosis not present

## 2018-03-15 DIAGNOSIS — J111 Influenza due to unidentified influenza virus with other respiratory manifestations: Secondary | ICD-10-CM | POA: Diagnosis not present

## 2018-03-29 ENCOUNTER — Other Ambulatory Visit: Payer: Self-pay | Admitting: Emergency Medicine

## 2018-04-03 DIAGNOSIS — L089 Local infection of the skin and subcutaneous tissue, unspecified: Secondary | ICD-10-CM | POA: Diagnosis not present

## 2018-04-30 ENCOUNTER — Inpatient Hospital Stay (HOSPITAL_COMMUNITY): Payer: Medicare Other | Attending: Hematology

## 2018-04-30 ENCOUNTER — Other Ambulatory Visit: Payer: Self-pay

## 2018-04-30 DIAGNOSIS — Z8572 Personal history of non-Hodgkin lymphomas: Secondary | ICD-10-CM | POA: Insufficient documentation

## 2018-04-30 DIAGNOSIS — R911 Solitary pulmonary nodule: Secondary | ICD-10-CM | POA: Insufficient documentation

## 2018-04-30 DIAGNOSIS — D801 Nonfamilial hypogammaglobulinemia: Secondary | ICD-10-CM | POA: Insufficient documentation

## 2018-04-30 DIAGNOSIS — Z9221 Personal history of antineoplastic chemotherapy: Secondary | ICD-10-CM | POA: Diagnosis not present

## 2018-04-30 LAB — COMPREHENSIVE METABOLIC PANEL
ALT: 13 U/L (ref 0–44)
AST: 19 U/L (ref 15–41)
Albumin: 3.8 g/dL (ref 3.5–5.0)
Alkaline Phosphatase: 119 U/L (ref 38–126)
Anion gap: 11 (ref 5–15)
BUN: 13 mg/dL (ref 8–23)
CO2: 28 mmol/L (ref 22–32)
Calcium: 9.2 mg/dL (ref 8.9–10.3)
Chloride: 101 mmol/L (ref 98–111)
Creatinine, Ser: 0.86 mg/dL (ref 0.44–1.00)
GFR calc Af Amer: >60 mL/min (ref >60)
GFR calc non Af Amer: >60 mL/min (ref >60)
Glucose, Bld: 100 mg/dL — ABNORMAL HIGH (ref 70–99)
Potassium: 3.7 mmol/L (ref 3.5–5.1)
Sodium: 140 mmol/L (ref 135–145)
Total Bilirubin: 0.5 mg/dL (ref 0.3–1.2)
Total Protein: 7.4 g/dL (ref 6.5–8.1)

## 2018-04-30 LAB — CBC WITH DIFFERENTIAL/PLATELET
Abs Immature Granulocytes: 0.04 10*3/uL (ref 0.00–0.07)
Basophils Absolute: 0.1 10*3/uL (ref 0.0–0.1)
Basophils Relative: 1 %
Eosinophils Absolute: 0.2 10*3/uL (ref 0.0–0.5)
Eosinophils Relative: 3 %
HCT: 41.7 % (ref 36.0–46.0)
Hemoglobin: 12.7 g/dL (ref 12.0–15.0)
Immature Granulocytes: 1 %
Lymphocytes Relative: 19 %
Lymphs Abs: 1.6 10*3/uL (ref 0.7–4.0)
MCH: 29 pg (ref 26.0–34.0)
MCHC: 30.5 g/dL (ref 30.0–36.0)
MCV: 95.2 fL (ref 80.0–100.0)
Monocytes Absolute: 0.7 10*3/uL (ref 0.1–1.0)
Monocytes Relative: 8 %
Neutro Abs: 5.8 10*3/uL (ref 1.7–7.7)
Neutrophils Relative %: 68 %
Platelets: 192 10*3/uL (ref 150–400)
RBC: 4.38 MIL/uL (ref 3.87–5.11)
RDW: 13.8 % (ref 11.5–15.5)
WBC: 8.3 10*3/uL (ref 4.0–10.5)
nRBC: 0 % (ref 0.0–0.2)

## 2018-04-30 LAB — LACTATE DEHYDROGENASE: LDH: 185 U/L (ref 98–192)

## 2018-05-01 ENCOUNTER — Inpatient Hospital Stay (HOSPITAL_COMMUNITY): Payer: Medicare Other

## 2018-05-01 ENCOUNTER — Inpatient Hospital Stay (HOSPITAL_BASED_OUTPATIENT_CLINIC_OR_DEPARTMENT_OTHER): Payer: Medicare Other | Admitting: Nurse Practitioner

## 2018-05-01 VITALS — BP 162/76 | HR 62 | Temp 98.0°F | Resp 18

## 2018-05-01 DIAGNOSIS — Z8572 Personal history of non-Hodgkin lymphomas: Secondary | ICD-10-CM

## 2018-05-01 DIAGNOSIS — R911 Solitary pulmonary nodule: Secondary | ICD-10-CM

## 2018-05-01 DIAGNOSIS — Z9221 Personal history of antineoplastic chemotherapy: Secondary | ICD-10-CM

## 2018-05-01 DIAGNOSIS — D801 Nonfamilial hypogammaglobulinemia: Secondary | ICD-10-CM

## 2018-05-01 LAB — IGG, IGA, IGM
IgA: <5 mg/dL — ABNORMAL LOW (ref 87–352)
IgG (Immunoglobin G), Serum: 1253 mg/dL (ref 586–1602)
IgM (Immunoglobulin M), Srm: 167 mg/dL (ref 26–217)

## 2018-05-01 MED ORDER — ACETAMINOPHEN 325 MG PO TABS: 650.0000 mg | ORAL_TABLET | Freq: Four times a day (QID) | ORAL | Status: DC | PRN

## 2018-05-01 MED ORDER — DIPHENHYDRAMINE HCL 25 MG PO TABS
25.0000 mg | ORAL_TABLET | Freq: Once | ORAL | Status: DC
  Filled 2018-05-01: qty 1

## 2018-05-01 MED ORDER — DEXTROSE 5 % IV SOLN
INTRAVENOUS | Status: DC
  Administered 2018-05-01: 10:00:00 via INTRAVENOUS

## 2018-05-01 MED ORDER — IMMUNE GLOBULIN (HUMAN) 10 GM/100ML IV SOLN
400.0000 mg/kg | Freq: Once | INTRAVENOUS | Status: AC
  Administered 2018-05-01: 25 g via INTRAVENOUS
  Filled 2018-05-01: qty 200

## 2018-05-01 NOTE — Assessment & Plan Note (Signed)
1. Acquired hypogammaglobulinemia: -She started off receiving IVIG every 4 weeks.  It was switched to every 6 weeks in July 2019. We now switched her to every 8 weeks.  She has been tolerating this very well. -She takes 5 mg of prednisone daily for her breathing. - Last IVIG was given 31/03/2018. -She was seen at her primary care doctor the first week in February and received antibiotics for sinus infection.  She also had one trip to the ER 02/23/2018 for IV fluids due to a virus. -She denies any new infections since her last visit.  She still uses her inhalers and takes prednisone for her breathing.  - Her blood work was reviewed and labs on 04/30/2018 showed her IgG trough level was 1,253. - She will receive IVIG infusion today. -She will continue to get her IVIG infusions every 2 months with labs. -We will see her back in 4 months for follow-up with labs.  2. Diffuse large B-cell lymphoma, stage IVb: -She was diagnosed 07/08/2009.  She was treated with 4 cycles of R-CHOP with intrathecal methotrexate during cycles 3 and 4.she developed pseudomonas sepsis after cycles 2 and 4 and therapy was stopped after 4 cycles.  She also received 2 years of maintenance rituximab. - PET CT scan on 03/20/2017 did not show any evidence of lymphoma. - Today's exam did not reveal any palpable adenopathy or splenomegaly. - LDH was 185.  We will do scans of clinical conditions dictates.  3. Left lung nodule: - PET/CT from 03/2017 revealed no evidence of lung nodule.  4. Nodule on spleen: - CT of abdomen pelvis on 02/23/2018 revealed a new 5 mm probably benign hypodensity within the anterior spleen. - It suggest 6 to 7-month MRI follow-up to document stability. - Denies any abdominal pain.  No splenomegaly on exam.  5. Health maintenance: - She had her mammogram on 09/08/2017 which was BI-RADS 1.

## 2018-05-01 NOTE — Progress Notes (Signed)
[place], [place] [address]   CLINIC:  Medical Oncology/Hematology  PCP:  [name] Sites, MD [address] [place] [address] [phone]   REASON FOR VISIT: Follow-up for acquired hypogammaglobulinemia AND diffuse large B-cell lymphoma stage IVb  CURRENT THERAPY: IVIG every 8 weeks AND observation  BRIEF ONCOLOGIC HISTORY:    Non Hodgkin's lymphoma ([place])   09/09/2009 Initial Diagnosis    Non Hodgkin's lymphoma ([place])      CANCER STAGING: Cancer Staging Non Hodgkin's lymphoma ([place]) Staging form: Lymphoid Neoplasms, AJCC 6th Edition - Clinical: Stage IV - Signed by Baird Cancer, PA on 11/05/2010    INTERVAL HISTORY:  [name] 63 y.o. female returns for routine follow-up for acquired hypogammaglobulinemia.  She reports she is done great since her last visit.  She reports no new sinus infections, UTIs, or respiratory infections.  She does report headaches from a pinched nerve in her neck.  She is going to try to make an appointment with a chiropractor to help her.  Otherwise she has no other complaints. Denies any nausea, vomiting, or diarrhea. Denies any new pains. Had not noticed any recent bleeding such as epistaxis, hematuria or hematochezia. Denies recent chest pain on exertion, shortness of breath on minimal exertion, pre-syncopal episodes, or palpitations. Denies any numbness or tingling in hands or feet. Denies any recent fevers, infections, or recent hospitalizations. Patient reports appetite at 50% and energy level at 50%.  She is eating well and maintaining her weight at this time.   REVIEW OF SYSTEMS:  Review of Systems  Neurological: Positive for headaches.  All other systems reviewed and are negative.    PAST MEDICAL/SURGICAL HISTORY:  Past Medical History:  Diagnosis Date  . Allergic rhinitis   . Allergy   . Anemia   . Anxiety   . Asthma   . B12 deficiency 01/18/2015  . Blood transfusion without  reported diagnosis   . Cataract   . Cavitary lung disease   . Chronic kidney disease    related to cancer  . Chronic sinusitis   . Clotting disorder ([place])   . COPD (chronic obstructive pulmonary disease) ([place])   . Depression   . DVT (deep venous thrombosis) ([place]) 09/25/2014  . Endotracheally intubated   . GERD (gastroesophageal reflux disease)   . Hematuria 04/07/2016  . Hyperlipidemia   . Hypertension   . Hypogammaglobulinemia, acquired ([place]) 12/30/2009   Qualifier: Diagnosis of  By: Joya Gaskins MD, Burnett Harry   . Iron deficiency anemia 01/18/2015  . Myocardial infarction ([place]) 2011  . 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 Non Hodgkin's lymphoma ([place]) 2011  . Orofacial dyskinesia 04/22/2015  . Orofacial dyskinesia 04/22/2015  . Osteoporosis   . Peripheral neuropathy due to chemotherapy ([place]) 05/02/2016  . Pneumonia 01/2013  . PONV (postoperative nausea and vomiting)   . Respiratory failure ([place])   . Sleep apnea   . SVC syndrome 09/25/2014   Past Surgical History:  Procedure Laterality Date  . ABDOMINAL HYSTERECTOMY     Fibroids  . BASAL CELL CARCINOMA EXCISION  03/2011   scalp  . BREAST SURGERY Right    biopsy  . CATARACT EXTRACTION W/PHACO Left 11/17/2014   Procedure: CATARACT EXTRACTION PHACO AND INTRAOCULAR LENS PLACEMENT LEFT EYE;  Surgeon: Tonny Branch, MD;  Location: [facility];  Service: Ophthalmology;  Laterality: Left;  CDE:5.60  . CATARACT EXTRACTION W/PHACO Right 12/15/2014   Procedure: CATARACT EXTRACTION PHACO AND INTRAOCULAR LENS PLACEMENT RIGHT EYE CDE=5.16;  Surgeon: Tonny Branch, MD;  Location: [facility];  Service: Ophthalmology;  Laterality: Right;  . COLONOSCOPY N/A 11/14/2012   Procedure: COLONOSCOPY;  Surgeon: Rogene Houston, MD;  Location: [facility];  Service: Endoscopy;  Laterality: N/A;  830  . COLONOSCOPY WITH PROPOFOL N/A 01/12/2018   Procedure: COLONOSCOPY WITH PROPOFOL;  Surgeon: Rogene Houston, MD;  Location: [facility];  Service: Endoscopy;   Laterality: N/A;  7:30  . EYE SURGERY    . NASAL SINUS SURGERY    . NECK SURGERY     x 2   . PERIPHERALLY INSERTED CENTRAL CATHETER INSERTION    . PICC Removal    . POLYPECTOMY  01/12/2018   Procedure: POLYPECTOMY;  Surgeon: Rogene Houston, MD;  Location: [facility];  Service: Endoscopy;;  colon  . PORT-A-CATH REMOVAL    . PORTACATH PLACEMENT  7/11   Removed 6/12  . TRACHEOSTOMY    . VESICOVAGINAL FISTULA CLOSURE W/ TAH       SOCIAL HISTORY:  Social History   Socioeconomic History  . Marital status: Divorced    Spouse name: Not on file  . Number of children: 3  . Years of education: 65  . Highest education level: Not on file  Occupational History  . Occupation: Estate agent    Comment: [employer]  . Financial resource strain: Not hard at all  . Food insecurity:    Worry: Never true    Inability: Never true  . Transportation needs:    Medical: No    Non-medical: No  Tobacco Use  . Smoking status: Never Smoker  . Smokeless tobacco: Never Used  Substance and Sexual Activity  . Alcohol use: No  . Drug use: No  . Sexual activity: Not Currently    Birth control/protection: Surgical    Comment: hyst  Lifestyle  . Physical activity:    Days per week: 0 days    Minutes per session: 0 min  . Stress: Not at all  Relationships  . Social connections:    Talks on phone: More than three times a week    Gets together: More than three times a week    Attends religious service: More than 4 times per year    Active member of club or organization: Yes    Attends meetings of clubs or organizations: More than 4 times per year    Relationship status: Divorced  . Intimate partner violence:    Fear of current or ex partner: No    Emotionally abused: No    Physically abused: No    Forced sexual activity: No  Other Topics Concern  . Not on file  Social History Narrative   Originally from [place]. Always lived in [place]. Prior travel to [place]. 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 She has 1 dog currently. She has 1 conures (small parrots). No mold exposure in her home. 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 No hot tub exposure. She enjoys sewing & quilting.     FAMILY HISTORY:  Family History  Problem Relation Age of Onset  . Emphysema Mother   . Stroke Mother   . COPD Mother   . Heart disease Mother        died in sleep  85  . Allergies Father   . Asthma Father        as a child  .  Arthritis Father   . Parkinson's disease Father 79  . Leukemia Maternal Grandmother   . Cancer Maternal Grandmother        Leukemia  . Diabetes Brother   . Heart attack Brother   . Hypertension Brother   . Heart disease Brother 40       stents  . Hypertension Sister   . Hypertension Brother     CURRENT MEDICATIONS:  Outpatient Encounter Medications as of 05/01/2018  Medication Sig  . acetaminophen (TYLENOL) 500 MG tablet Take 1,000 mg by mouth every 6 (six) hours as needed for moderate pain or headache.   [name] Kitchen acyclovir (ZOVIRAX) 400 MG tablet Take 1 tablet (400 mg total) by mouth 2 (two) times daily.  [name] Kitchen albuterol (PROAIR HFA) 108 (90 Base) MCG/ACT inhaler Inhale 2 puffs into the lungs every 6 (six) hours as needed. (Patient taking differently: Inhale 2 puffs into the lungs every 6 (six) hours as needed for wheezing or shortness of breath. )  . albuterol (PROVENTIL) (2.5 MG/3ML) 0.083% nebulizer solution Take 2.5 mg by nebulization every 4 (four) hours as needed for wheezing or shortness of breath.  [name] Kitchen alendronate (FOSAMAX) 70 MG tablet Take 1 tablet (70 mg total) by mouth once a week. Take with a full glass of water on an empty stomach. (Patient taking differently: Take 70 mg by mouth every Sunday. Take with a full glass of water on an empty stomach.)  . amphetamine-dextroamphetamine (ADDERALL) 10 MG tablet Take 10 mg by mouth every morning.   . Armodafinil (NUVIGIL) 150 MG tablet Take 150  mg by mouth every morning.   . budesonide-formoterol (SYMBICORT) 160-4.5 MCG/ACT inhaler Inhale 2 puffs into the lungs 2 (two) times daily.  . Cholecalciferol (VITAMIN D3 PO) Take 1 capsule by mouth at bedtime.  . citalopram (CELEXA) 20 MG tablet Take 20 mg by mouth every morning.    . cyanocobalamin 1000 MCG tablet Take 1,000 mcg by mouth daily.  [name] Kitchen doxycycline (VIBRAMYCIN) 100 MG capsule Take 100 mg by mouth 2 (two) times daily. 10 day course starting on 02/09/2018-COMPLETED COURSE  . furosemide (LASIX) 20 MG tablet Take 1 tablet (20 mg total) by mouth as needed for fluid.  [name] Kitchen gabapentin (NEURONTIN) 300 MG capsule Take 300 mg by mouth 2 (two) times daily.   . mupirocin ointment (BACTROBAN) 2 %   . omeprazole (PRILOSEC) 20 MG capsule TAKE ONE CAPSULE BY MOUTH TWICE DAILY BEFORE A MEAL. (Patient taking differently: Take 20 mg by mouth 2 (two) times daily. )  . predniSONE (DELTASONE) 5 MG tablet TAKE 1 TABLET BY MOUTH ONCE A DAY.  . simvastatin (ZOCOR) 80 MG tablet Take 1 tablet (80 mg total) by mouth at bedtime.  [name] Kitchen tiotropium (SPIRIVA) 18 MCG inhalation capsule Place 1 capsule (18 mcg total) into inhaler and inhale daily.  . zafirlukast (ACCOLATE) 20 MG tablet Take 1 tablet (20 mg total) by mouth 2 (two) times daily. (Patient taking differently: Take 20 mg by mouth at bedtime. )   Facility-Administered Encounter Medications as of 05/01/2018  Medication  . 0.9 %  sodium chloride infusion  . 0.9 %  sodium chloride infusion  . acetaminophen (TYLENOL) tablet 650 mg  . acetaminophen (TYLENOL) tablet 650 mg  . dextrose 5 % solution  . dextrose 5 % solution  . dextrose 5 % solution  . diphenhydrAMINE (BENADRYL) capsule 25 mg  . diphenhydrAMINE (BENADRYL) capsule 25 mg  . sodium chloride 0.9 % injection 10 mL    ALLERGIES:  Allergies  Allergen Reactions  . Meperidine Hcl Anaphylaxis  . Montelukast Sodium Hives and Rash     PHYSICAL EXAM:  ECOG Performance status: 1  Vitals:   05/01/18  0855  BP: (!) 158/92  Pulse: 81  Resp: 18  Temp: (!) 97.5 F (36.4 C)  SpO2: 98%   Filed Weights   05/01/18 0855  Weight: 136 lb 3.2 oz (61.8 kg)    Physical Exam Constitutional:      Appearance: Normal appearance. She is normal weight.  Cardiovascular:     Rate and Rhythm: Normal rate and regular rhythm.     Heart sounds: Normal heart sounds.  Pulmonary:     Effort: Pulmonary effort is normal.     Breath sounds: Wheezing present.  Abdominal:     General: Bowel sounds are normal.     Palpations: Abdomen is soft.  Musculoskeletal: Normal range of motion.  Skin:    General: Skin is warm and dry.  Neurological:     Mental Status: She is alert and oriented to person, place, and time. Mental status is at baseline.  Psychiatric:        Mood and Affect: Mood normal.        Behavior: Behavior normal.        Thought Content: Thought content normal.        Judgment: Judgment normal.      LABORATORY DATA:  I have reviewed the labs as listed.  CBC    Component Value Date/Time   WBC 8.3 04/30/2018 0910   RBC 4.38 04/30/2018 0910   HGB 12.7 04/30/2018 0910   HCT 41.7 04/30/2018 0910   PLT 192 04/30/2018 0910   MCV 95.2 04/30/2018 0910   MCH 29.0 04/30/2018 0910   MCHC 30.5 04/30/2018 0910   RDW 13.8 04/30/2018 0910   LYMPHSABS 1.6 04/30/2018 0910   MONOABS 0.7 04/30/2018 0910   EOSABS 0.2 04/30/2018 0910   BASOSABS 0.1 04/30/2018 0910   CMP Latest Ref Rng & Units 04/30/2018 03/05/2018 02/23/2018  Glucose 70 - 99 mg/dL 100(H) 136(H) 100(H)  BUN 8 - 23 mg/dL 13 12 25(H)  Creatinine 0.44 - 1.00 mg/dL 0.86 0.82 0.91  Sodium 135 - 145 mmol/L 140 136 133(L)  Potassium 3.5 - 5.1 mmol/L 3.7 3.5 3.4(L)  Chloride 98 - 111 mmol/L 101 98 98  CO2 22 - 32 mmol/L 28 28 25   Calcium 8.9 - 10.3 mg/dL 9.2 8.8(L) 8.6(L)  Total Protein 6.5 - 8.1 g/dL 7.4 7.0 6.9  Total Bilirubin 0.3 - 1.2 mg/dL 0.5 0.8 0.9  Alkaline Phos 38 - 126 U/L 119 101 86  AST 15 - 41 U/L 19 23 33  ALT 0 - 44  U/L 13 25 24     I personally performed a face-to-face visit.  All questions were answered to patient's stated satisfaction. Encouraged patient to call with any new concerns or questions before his next visit to the cancer center and we can certain see him sooner, if needed.     ASSESSMENT & PLAN:   Hypogammaglobulinemia, acquired ([place]) 1. Acquired hypogammaglobulinemia: -She started off receiving IVIG every 4 weeks.  It was switched to every 6 weeks in July 2019. We now switched her to every 8 weeks.  She has been tolerating this very well. -She takes 5 mg of prednisone daily for her breathing. - Last IVIG was given 31/03/2018. -She was seen at her primary care doctor the first week in February and received antibiotics for sinus infection.  She also had  one trip to the ER 02/23/2018 for IV fluids due to a virus. -She denies any new infections since her last visit.  She still uses her inhalers and takes prednisone for her breathing.  - Her blood work was reviewed and labs on 04/30/2018 showed her IgG trough level was 1,253. - She will receive IVIG infusion today. -She will continue to get her IVIG infusions every 2 months with labs. -We will see her back in 4 months for follow-up with labs.  2. Diffuse large B-cell lymphoma, stage IVb: -She was diagnosed 07/08/2009.  She was treated with 4 cycles of R-CHOP with intrathecal methotrexate during cycles 3 and 4.she developed pseudomonas sepsis after cycles 2 and 4 and therapy was stopped after 4 cycles.  She also received 2 years of maintenance rituximab. - PET CT scan on 03/20/2017 did not show any evidence of lymphoma. - Today's exam did not reveal any palpable adenopathy or splenomegaly. - LDH was 185.  We will do scans of clinical conditions dictates.  3. Left lung nodule: - PET/CT from 03/2017 revealed no evidence of lung nodule.  4. Nodule on spleen: - CT of abdomen pelvis on 02/23/2018 revealed a new 5 mm probably benign hypodensity within  the anterior spleen. - It suggest 6 to 31-month MRI follow-up to document stability. - Denies any abdominal pain.  No splenomegaly on exam.  5. Health maintenance: - She had her mammogram on 09/08/2017 which was BI-RADS 1.      Orders placed this encounter:  Orders Placed This Encounter  Procedures  . Lactate dehydrogenase  . IgG, IgA, IgM  . CBC with Differential/Platelet  . Comprehensive metabolic panel     Francene Finders, FNP-C [place] [phone]

## 2018-05-01 NOTE — Patient Instructions (Signed)
[facility] Discharge Instructions for Patients Receiving Chemotherapy  Today you received the following chemotherapy agents   To help prevent nausea and vomiting after your treatment, we encourage you to take your nausea medication   If you develop nausea and vomiting that is not controlled by your nausea medication, call the clinic.   BELOW ARE SYMPTOMS THAT SHOULD BE REPORTED IMMEDIATELY:  *FEVER GREATER THAN 100.5 F  *CHILLS WITH OR WITHOUT FEVER  NAUSEA AND VOMITING THAT IS NOT CONTROLLED WITH YOUR NAUSEA MEDICATION  *UNUSUAL SHORTNESS OF BREATH  *UNUSUAL BRUISING OR BLEEDING  TENDERNESS IN MOUTH AND THROAT WITH OR WITHOUT PRESENCE OF ULCERS  *URINARY PROBLEMS  *BOWEL PROBLEMS  UNUSUAL RASH Items with * indicate a potential emergency and should be followed up as soon as possible.  Feel free to call the clinic should you have any questions or concerns. The clinic phone number is [phone].  Please show the CHEMO ALERT CARD at check-in to the Emergency Department and triage nurse.   

## 2018-05-01 NOTE — Patient Instructions (Signed)
[place] at [facility] Discharge Instructions  Continue every 8 week IVIG infusions with labs  Follow up with NP in 4 months with labs    Thank you for choosing [place] at [facility] to provide your oncology and hematology care.  To afford each patient quality time with our provider, please arrive at least 15 minutes before your scheduled appointment time.   If you have a lab appointment with the [facility] please come in thru the  Main Entrance and check in at the main information desk  You need to re-schedule your appointment should you arrive 10 or more minutes late.  We strive to give you quality time with our providers, and arriving late affects you and other patients whose appointments are after yours.  Also, if you no show three or more times for appointments you may be dismissed from the clinic at the providers discretion.     Again, thank you for choosing [facility].  Our hope is that these requests will decrease the amount of time that you wait before being seen by our physicians.       _____________________________________________________________  Should you have questions after your visit to [facility], please contact our office at [phone] between the hours of 8:00 a.m. and 4:30 p.m.  Voicemails left after 4:00 p.m. will not be returned until the following business day.  For prescription refill requests, have your pharmacy contact our office and allow 72 hours.    Cancer Center Support Programs:   > Cancer Support Group  2nd Tuesday of the month 1pm-2pm, Journey Room

## 2018-06-18 DIAGNOSIS — Z79899 Other long term (current) drug therapy: Secondary | ICD-10-CM | POA: Diagnosis not present

## 2018-06-18 DIAGNOSIS — G47419 Narcolepsy without cataplexy: Secondary | ICD-10-CM | POA: Diagnosis not present

## 2018-06-18 DIAGNOSIS — E1140 Type 2 diabetes mellitus with diabetic neuropathy, unspecified: Secondary | ICD-10-CM | POA: Diagnosis not present

## 2018-06-18 DIAGNOSIS — G4733 Obstructive sleep apnea (adult) (pediatric): Secondary | ICD-10-CM | POA: Diagnosis not present

## 2018-06-20 ENCOUNTER — Other Ambulatory Visit (HOSPITAL_COMMUNITY): Payer: Self-pay | Admitting: Hematology

## 2018-06-20 DIAGNOSIS — D801 Nonfamilial hypogammaglobulinemia: Secondary | ICD-10-CM

## 2018-06-25 ENCOUNTER — Other Ambulatory Visit (HOSPITAL_COMMUNITY): Payer: Medicare Other

## 2018-06-26 ENCOUNTER — Ambulatory Visit (HOSPITAL_COMMUNITY): Payer: Medicare Other

## 2018-07-02 ENCOUNTER — Inpatient Hospital Stay (HOSPITAL_COMMUNITY): Payer: Medicare Other | Attending: Hematology

## 2018-07-02 ENCOUNTER — Other Ambulatory Visit: Payer: Self-pay

## 2018-07-02 DIAGNOSIS — Z79899 Other long term (current) drug therapy: Secondary | ICD-10-CM | POA: Insufficient documentation

## 2018-07-02 DIAGNOSIS — D801 Nonfamilial hypogammaglobulinemia: Secondary | ICD-10-CM | POA: Diagnosis present

## 2018-07-02 DIAGNOSIS — R911 Solitary pulmonary nodule: Secondary | ICD-10-CM | POA: Diagnosis not present

## 2018-07-02 DIAGNOSIS — Z8572 Personal history of non-Hodgkin lymphomas: Secondary | ICD-10-CM | POA: Diagnosis not present

## 2018-07-02 DIAGNOSIS — Z9221 Personal history of antineoplastic chemotherapy: Secondary | ICD-10-CM | POA: Diagnosis not present

## 2018-07-02 LAB — CBC WITH DIFFERENTIAL/PLATELET
Abs Immature Granulocytes: 0.02 10*3/uL (ref 0.00–0.07)
Basophils Absolute: 0 10*3/uL (ref 0.0–0.1)
Basophils Relative: 0 %
Eosinophils Absolute: 0.2 10*3/uL (ref 0.0–0.5)
Eosinophils Relative: 2 %
HCT: 37 % (ref 36.0–46.0)
Hemoglobin: 11.4 g/dL — ABNORMAL LOW (ref 12.0–15.0)
Immature Granulocytes: 0 %
Lymphocytes Relative: 18 %
Lymphs Abs: 1.6 10*3/uL (ref 0.7–4.0)
MCH: 29.3 pg (ref 26.0–34.0)
MCHC: 30.8 g/dL (ref 30.0–36.0)
MCV: 95.1 fL (ref 80.0–100.0)
Monocytes Absolute: 0.7 10*3/uL (ref 0.1–1.0)
Monocytes Relative: 8 %
Neutro Abs: 6.3 10*3/uL (ref 1.7–7.7)
Neutrophils Relative %: 72 %
Platelets: 167 10*3/uL (ref 150–400)
RBC: 3.89 MIL/uL (ref 3.87–5.11)
RDW: 13.3 % (ref 11.5–15.5)
WBC: 8.8 10*3/uL (ref 4.0–10.5)
nRBC: 0 % (ref 0.0–0.2)

## 2018-07-02 LAB — COMPREHENSIVE METABOLIC PANEL
ALT: 13 U/L (ref 0–44)
AST: 18 U/L (ref 15–41)
Albumin: 3.6 g/dL (ref 3.5–5.0)
Alkaline Phosphatase: 116 U/L (ref 38–126)
Anion gap: 12 (ref 5–15)
BUN: 12 mg/dL (ref 8–23)
CO2: 30 mmol/L (ref 22–32)
Calcium: 8.8 mg/dL — ABNORMAL LOW (ref 8.9–10.3)
Chloride: 100 mmol/L (ref 98–111)
Creatinine, Ser: 0.93 mg/dL (ref 0.44–1.00)
GFR calc Af Amer: >60 mL/min (ref >60)
GFR calc non Af Amer: >60 mL/min (ref >60)
Glucose, Bld: 91 mg/dL (ref 70–99)
Potassium: 3.2 mmol/L — ABNORMAL LOW (ref 3.5–5.1)
Sodium: 142 mmol/L (ref 135–145)
Total Bilirubin: 0.5 mg/dL (ref 0.3–1.2)
Total Protein: 6.9 g/dL (ref 6.5–8.1)

## 2018-07-02 LAB — LACTATE DEHYDROGENASE: LDH: 166 U/L (ref 98–192)

## 2018-07-03 ENCOUNTER — Inpatient Hospital Stay (HOSPITAL_COMMUNITY): Payer: Medicare Other

## 2018-07-03 VITALS — BP 156/73 | HR 57 | Temp 98.1°F | Resp 16 | Wt 135.4 lb

## 2018-07-03 DIAGNOSIS — R911 Solitary pulmonary nodule: Secondary | ICD-10-CM | POA: Diagnosis not present

## 2018-07-03 DIAGNOSIS — Z8572 Personal history of non-Hodgkin lymphomas: Secondary | ICD-10-CM | POA: Diagnosis not present

## 2018-07-03 DIAGNOSIS — Z79899 Other long term (current) drug therapy: Secondary | ICD-10-CM | POA: Diagnosis not present

## 2018-07-03 DIAGNOSIS — Z9221 Personal history of antineoplastic chemotherapy: Secondary | ICD-10-CM | POA: Diagnosis not present

## 2018-07-03 DIAGNOSIS — D801 Nonfamilial hypogammaglobulinemia: Secondary | ICD-10-CM | POA: Diagnosis not present

## 2018-07-03 LAB — IGG, IGA, IGM
IgA: <5 mg/dL — ABNORMAL LOW (ref 87–352)
IgG (Immunoglobin G), Serum: 1216 mg/dL (ref 586–1602)
IgM (Immunoglobulin M), Srm: 97 mg/dL (ref 26–217)

## 2018-07-03 MED ORDER — ACETAMINOPHEN 325 MG PO TABS: 650.0000 mg | ORAL_TABLET | Freq: Four times a day (QID) | ORAL | Status: DC | PRN

## 2018-07-03 MED ORDER — IMMUNE GLOBULIN (HUMAN) 10 GM/100ML IV SOLN
25.0000 g | Freq: Once | INTRAVENOUS | Status: AC
  Administered 2018-07-03: 25 g via INTRAVENOUS
  Filled 2018-07-03: qty 200

## 2018-07-03 MED ORDER — DEXTROSE 5 % IV SOLN
INTRAVENOUS | Status: DC
  Administered 2018-07-03: 09:00:00 via INTRAVENOUS

## 2018-07-03 MED ORDER — DIPHENHYDRAMINE HCL 25 MG PO CAPS: 25.0000 mg | ORAL_CAPSULE | Freq: Once | ORAL | Status: DC

## 2018-07-03 NOTE — Patient Instructions (Signed)
[facility] at [facility] _______________________________________________________________  Thank you for choosing [facility] at [facility] to provide your oncology and hematology care.  To afford each patient quality time with our providers, please arrive at least 15 minutes before your scheduled appointment.  You need to re-schedule your appointment if you arrive 10 or more minutes late.  We strive to give you quality time with our providers, and arriving late affects you and other patients whose appointments are after yours.  Also, if you no show three or more times for appointments you may be dismissed from the clinic.  Again, thank you for choosing [facility] at [facility]. Our hope is that these requests will allow you access to exceptional care and in a timely manner. _______________________________________________________________  If you have questions after your visit, please contact our office at [phone] between the hours of 8:30 a.m. and 5:00 p.m. Voicemails left after 4:30 p.m. will not be returned until the following business day. _______________________________________________________________  For prescription refill requests, have your pharmacy contact our office. _______________________________________________________________  Recommendations made by the consultant and any test results will be sent to your referring physician. _______________________________________________________________ 

## 2018-07-03 NOTE — Progress Notes (Signed)
Arletha Pili tolerated IVIG without incident or complaint. VSS prior to and after infusion. Discharged self ambulatory in satisfactory condition.

## 2018-07-12 ENCOUNTER — Other Ambulatory Visit: Payer: Self-pay | Admitting: Emergency Medicine

## 2018-08-20 ENCOUNTER — Other Ambulatory Visit (HOSPITAL_COMMUNITY): Payer: Medicare Other

## 2018-08-21 ENCOUNTER — Ambulatory Visit (HOSPITAL_COMMUNITY): Payer: Medicare Other

## 2018-08-21 ENCOUNTER — Ambulatory Visit (HOSPITAL_COMMUNITY): Payer: Medicare Other | Admitting: Nurse Practitioner

## 2018-08-27 ENCOUNTER — Inpatient Hospital Stay (HOSPITAL_COMMUNITY): Payer: Medicare Other | Attending: Hematology

## 2018-08-27 ENCOUNTER — Other Ambulatory Visit: Payer: Self-pay

## 2018-08-27 DIAGNOSIS — R911 Solitary pulmonary nodule: Secondary | ICD-10-CM | POA: Diagnosis not present

## 2018-08-27 DIAGNOSIS — Z8572 Personal history of non-Hodgkin lymphomas: Secondary | ICD-10-CM | POA: Insufficient documentation

## 2018-08-27 DIAGNOSIS — R51 Headache: Secondary | ICD-10-CM | POA: Diagnosis not present

## 2018-08-27 DIAGNOSIS — D801 Nonfamilial hypogammaglobulinemia: Secondary | ICD-10-CM | POA: Insufficient documentation

## 2018-08-27 LAB — COMPREHENSIVE METABOLIC PANEL
ALT: 14 U/L (ref 0–44)
AST: 18 U/L (ref 15–41)
Albumin: 3.5 g/dL (ref 3.5–5.0)
Alkaline Phosphatase: 101 U/L (ref 38–126)
Anion gap: 11 (ref 5–15)
BUN: 12 mg/dL (ref 8–23)
CO2: 28 mmol/L (ref 22–32)
Calcium: 8.8 mg/dL — ABNORMAL LOW (ref 8.9–10.3)
Chloride: 101 mmol/L (ref 98–111)
Creatinine, Ser: 0.78 mg/dL (ref 0.44–1.00)
GFR calc Af Amer: >60 mL/min (ref >60)
GFR calc non Af Amer: >60 mL/min (ref >60)
Glucose, Bld: 84 mg/dL (ref 70–99)
Potassium: 3.5 mmol/L (ref 3.5–5.1)
Sodium: 140 mmol/L (ref 135–145)
Total Bilirubin: 0.4 mg/dL (ref 0.3–1.2)
Total Protein: 6.8 g/dL (ref 6.5–8.1)

## 2018-08-27 LAB — CBC WITH DIFFERENTIAL/PLATELET
Abs Immature Granulocytes: 0.03 10*3/uL (ref 0.00–0.07)
Basophils Absolute: 0 10*3/uL (ref 0.0–0.1)
Basophils Relative: 0 %
Eosinophils Absolute: 0.2 10*3/uL (ref 0.0–0.5)
Eosinophils Relative: 3 %
HCT: 37.4 % (ref 36.0–46.0)
Hemoglobin: 11.3 g/dL — ABNORMAL LOW (ref 12.0–15.0)
Immature Granulocytes: 0 %
Lymphocytes Relative: 19 %
Lymphs Abs: 1.6 10*3/uL (ref 0.7–4.0)
MCH: 29 pg (ref 26.0–34.0)
MCHC: 30.2 g/dL (ref 30.0–36.0)
MCV: 96.1 fL (ref 80.0–100.0)
Monocytes Absolute: 0.7 10*3/uL (ref 0.1–1.0)
Monocytes Relative: 9 %
Neutro Abs: 5.7 10*3/uL (ref 1.7–7.7)
Neutrophils Relative %: 69 %
Platelets: 187 10*3/uL (ref 150–400)
RBC: 3.89 MIL/uL (ref 3.87–5.11)
RDW: 13.4 % (ref 11.5–15.5)
WBC: 8.3 10*3/uL (ref 4.0–10.5)
nRBC: 0 % (ref 0.0–0.2)

## 2018-08-27 LAB — LACTATE DEHYDROGENASE: LDH: 166 U/L (ref 98–192)

## 2018-08-28 ENCOUNTER — Inpatient Hospital Stay (HOSPITAL_BASED_OUTPATIENT_CLINIC_OR_DEPARTMENT_OTHER): Payer: Medicare Other | Admitting: Nurse Practitioner

## 2018-08-28 ENCOUNTER — Inpatient Hospital Stay (HOSPITAL_COMMUNITY): Payer: Medicare Other

## 2018-08-28 VITALS — BP 167/76 | HR 65 | Temp 97.4°F | Resp 18

## 2018-08-28 DIAGNOSIS — C8338 Diffuse large B-cell lymphoma, lymph nodes of multiple sites: Secondary | ICD-10-CM | POA: Diagnosis not present

## 2018-08-28 DIAGNOSIS — D801 Nonfamilial hypogammaglobulinemia: Secondary | ICD-10-CM

## 2018-08-28 DIAGNOSIS — R51 Headache: Secondary | ICD-10-CM | POA: Diagnosis not present

## 2018-08-28 DIAGNOSIS — Z8572 Personal history of non-Hodgkin lymphomas: Secondary | ICD-10-CM | POA: Diagnosis not present

## 2018-08-28 DIAGNOSIS — R911 Solitary pulmonary nodule: Secondary | ICD-10-CM | POA: Diagnosis not present

## 2018-08-28 LAB — IGG, IGA, IGM
IgA: <5 mg/dL — ABNORMAL LOW (ref 87–352)
IgG (Immunoglobin G), Serum: 1177 mg/dL (ref 586–1602)
IgM (Immunoglobulin M), Srm: 89 mg/dL (ref 26–217)

## 2018-08-28 MED ORDER — DIPHENHYDRAMINE HCL 25 MG PO CAPS: 25.0000 mg | ORAL_CAPSULE | Freq: Once | ORAL | Status: DC

## 2018-08-28 MED ORDER — IMMUNE GLOBULIN (HUMAN) 10 GM/100ML IV SOLN
30.0000 g | Freq: Once | INTRAVENOUS | Status: AC
  Administered 2018-08-28: 11:00:00 30 g via INTRAVENOUS
  Filled 2018-08-28: qty 200

## 2018-08-28 MED ORDER — DEXTROSE 5 % IV SOLN
INTRAVENOUS | Status: DC
  Administered 2018-08-28: 10:00:00 via INTRAVENOUS

## 2018-08-28 MED ORDER — ACETAMINOPHEN 325 MG PO TABS: 650.0000 mg | ORAL_TABLET | Freq: Four times a day (QID) | ORAL | Status: DC | PRN

## 2018-08-28 NOTE — Patient Instructions (Signed)
[facility] at [facility]  Discharge Instructions:   _______________________________________________________________  Thank you for choosing [facility] at [facility] to provide your oncology and hematology care.  To afford each patient quality time with our providers, please arrive at least 15 minutes before your scheduled appointment.  You need to re-schedule your appointment if you arrive 10 or more minutes late.  We strive to give you quality time with our providers, and arriving late affects you and other patients whose appointments are after yours.  Also, if you no show three or more times for appointments you may be dismissed from the clinic.  Again, thank you for choosing [facility] at [facility]. Our hope is that these requests will allow you access to exceptional care and in a timely manner. _______________________________________________________________  If you have questions after your visit, please contact our office at [phone] between the hours of 8:30 a.m. and 5:00 p.m. Voicemails left after 4:30 p.m. will not be returned until the following business day. _______________________________________________________________  For prescription refill requests, have your pharmacy contact our office. _______________________________________________________________  Recommendations made by the consultant and any test results will be sent to your referring physician. _______________________________________________________________ 

## 2018-08-28 NOTE — Patient Instructions (Signed)
[place] at [facility] Discharge Instructions  Continue every 2 months infusions. Follow-up in 4 months with labs, infusion, an MRI of the abdomen.   Thank you for choosing [place] at [facility] to provide your oncology and hematology care.  To afford each patient quality time with our provider, please arrive at least 15 minutes before your scheduled appointment time.   If you have a lab appointment with the [place] [place] please come in thru the Main Entrance and check in at the main information desk.  You need to re-schedule your appointment should you arrive 10 or more minutes late.  We strive to give you quality time with our providers, and arriving late affects you and other patients whose appointments are after yours.  Also, if you no show three or more times for appointments you may be dismissed from the clinic at the providers discretion.     Again, thank you for choosing [facility].  Our hope is that these requests will decrease the amount of time that you wait before being seen by our physicians.       _____________________________________________________________  Should you have questions after your visit to [facility], please contact our office at [phone] between the hours of 8:00 a.m. and 4:30 p.m.  Voicemails left after 4:00 p.m. will not be returned until the following business day.  For prescription refill requests, have your pharmacy contact our office and allow 72 hours.    Due to Covid, you will need to wear a mask upon entering the hospital. If you do not have a mask, a mask will be given to you at the Main Entrance upon arrival. For doctor visits, patients may have 1 support person with them. For treatment visits, patients can not have anyone with them due to social distancing guidelines and our immunocompromised population.

## 2018-08-28 NOTE — Assessment & Plan Note (Addendum)
1. Acquired hypogammaglobulinemia: -She started off receiving IVIG every 4 weeks.  It was switched to every 6 weeks in July 2019. We now switched her to every 8 weeks.  She has been tolerating this very well. -She takes 5 mg of prednisone daily for her breathing. - Last IVIG was given 07/03/2018. -She was seen at her primary care doctor the first week in February and received antibiotics for sinus infection.  She also had one trip to the ER 02/23/2018 for IV fluids due to a virus. -She denies any new infections since her last visit.  She still uses her inhalers and takes prednisone for her breathing.  - Her blood work was reviewed and labs on 08/28/2018 showed her IgG trough level was 1,177. - She will receive IVIG infusion today. -She will continue to get her IVIG infusions every 2 months with labs. -We will see her back in 4 months for follow-up with labs.  2. Diffuse large B-cell lymphoma, stage IVb: -She was diagnosed 07/08/2009.  She was treated with 4 cycles of R-CHOP with intrathecal methotrexate during cycles 3 and 4.she developed pseudomonas sepsis after cycles 2 and 4 and therapy was stopped after 4 cycles.  She also received 2 years of maintenance rituximab. - PET CT scan on 03/20/2017 did not show any evidence of lymphoma. - Today's exam did not reveal any palpable adenopathy or splenomegaly. -Labs on 08/28/2018 showed her LDH 166. - We will do scans of clinical conditions dictates.  3. Left lung nodule: - PET/CT from 03/2017 revealed no evidence of lung nodule.  4. Nodule on spleen: - CT of abdomen pelvis on 02/23/2018 revealed a new 5 mm probably benign hypodensity within the anterior spleen. - It suggest 6 to 73-month MRI follow-up to document stability. - Denies any abdominal pain.  No splenomegaly on exam. -We will repeat an MRI of her abdomen with her next visit in 4 months.  5. Health maintenance: - She had her mammogram on 09/08/2017 which was BI-RADS 1.

## 2018-08-28 NOTE — Progress Notes (Signed)
Pt presents today for f/u visit with RLockamy NP and treatment today. VSS. Pt has no complaints of pain or any changes since the last visit. MAR reviewed. Labs printed and given to patient today.    Treatment given today per MD orders. Tolerated infusion without adverse affects. Vital signs stable. No complaints at this time. Discharged from clinic ambulatory. F/U with [facility] as scheduled.

## 2018-08-28 NOTE — Progress Notes (Signed)
[place], [place] [address]   CLINIC:  Medical Oncology/Hematology  PCP:  Sharilyn Sites, MD [address] [place] [id number] [phone]   REASON FOR VISIT: Follow-up for non-Hodgkin's lymphoma and hypogammaglobulinemia  CURRENT THERAPY: Observation and IVIG  BRIEF ONCOLOGIC HISTORY:  Oncology History  Non Hodgkin's lymphoma ([place])  09/09/2009 Initial Diagnosis   Non Hodgkin's lymphoma ([place])      CANCER STAGING: Cancer Staging Non Hodgkin's lymphoma ([place]) Staging form: Lymphoid Neoplasms, AJCC 6th Edition - Clinical: Stage IV - Signed by Baird Cancer, PA on 11/05/2010    INTERVAL HISTORY:  Ms. Tunney 63 y.o. female returns for routine follow-up for non-Hodgkin's lymphoma.  She denies any recent infections or hospitalizations.  She does have occasional headaches. Denies any nausea, vomiting, or diarrhea. Denies any new pains. Had not noticed any recent bleeding such as epistaxis, hematuria or hematochezia. Denies recent chest pain on exertion, shortness of breath on minimal exertion, pre-syncopal episodes, or palpitations. Denies any numbness or tingling in hands or feet. Denies any recent fevers, infections, or recent hospitalizations. Patient reports appetite at 75% and energy level at 75%.  She is eating well maintaining her weight at this time.    REVIEW OF SYSTEMS:  Review of Systems  Constitutional: Positive for fatigue.  Neurological: Positive for headaches.  Hematological: Bruises/bleeds easily.  All other systems reviewed and are negative.    PAST MEDICAL/SURGICAL HISTORY:  Past Medical History:  Diagnosis Date  . Allergic rhinitis   . Allergy   . Anemia   . Anxiety   . Asthma   . B12 deficiency 01/18/2015  . Blood transfusion without reported diagnosis   . Cataract   . Cavitary lung disease   . Chronic kidney disease    related to cancer  . Chronic sinusitis   . Clotting disorder ([place])   . COPD  (chronic obstructive pulmonary disease) ([place])   . Depression   . DVT (deep venous thrombosis) ([place]) 09/25/2014  . Endotracheally intubated   . GERD (gastroesophageal reflux disease)   . Hematuria 04/07/2016  . Hyperlipidemia   . Hypertension   . Hypogammaglobulinemia, acquired ([place]) 12/30/2009   Qualifier: Diagnosis of  By: Joya Gaskins MD, Burnett Harry   . Iron deficiency anemia 01/18/2015  . Myocardial infarction ([place]) 2011  . 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 Non Hodgkin's lymphoma ([place]) 2011  . Orofacial dyskinesia 04/22/2015  . Orofacial dyskinesia 04/22/2015  . Osteoporosis   . Peripheral neuropathy due to chemotherapy ([place]) 05/02/2016  . Pneumonia 01/2013  . PONV (postoperative nausea and vomiting)   . Respiratory failure ([place])   . Sleep apnea   . SVC syndrome 09/25/2014   Past Surgical History:  Procedure Laterality Date  . ABDOMINAL HYSTERECTOMY     Fibroids  . BASAL CELL CARCINOMA EXCISION  03/2011   scalp  . BREAST SURGERY Right    biopsy  . CATARACT EXTRACTION W/PHACO Left 11/17/2014   Procedure: CATARACT EXTRACTION PHACO AND INTRAOCULAR LENS PLACEMENT LEFT EYE;  Surgeon: Tonny Branch, MD;  Location: [facility];  Service: Ophthalmology;  Laterality: Left;  CDE:5.60  . CATARACT EXTRACTION W/PHACO Right 12/15/2014   Procedure: CATARACT EXTRACTION PHACO AND INTRAOCULAR LENS PLACEMENT RIGHT EYE CDE=5.16;  Surgeon: Tonny Branch, MD;  Location: [facility];  Service: Ophthalmology;  Laterality: Right;  . COLONOSCOPY N/A 11/14/2012   Procedure: COLONOSCOPY;  Surgeon: Rogene Houston, MD;  Location: [facility] SU[facility];  Service: Endoscopy;  Laterality: N/A;  830  .  COLONOSCOPY WITH PROPOFOL N/A 01/12/2018   Procedure: COLONOSCOPY WITH PROPOFOL;  Surgeon: Rogene Houston, MD;  Location: [facility];  Service: Endoscopy;  Laterality: N/A;  7:30  . EYE SURGERY    . NASAL SINUS SURGERY    . NECK SURGERY     x 2   . PERIPHERALLY INSERTED CENTRAL CATHETER INSERTION    . PICC Removal    .  POLYPECTOMY  01/12/2018   Procedure: POLYPECTOMY;  Surgeon: Rogene Houston, MD;  Location: [facility];  Service: Endoscopy;;  colon  . PORT-A-CATH REMOVAL    . PORTACATH PLACEMENT  7/11   Removed 6/12  . TRACHEOSTOMY    . VESICOVAGINAL FISTULA CLOSURE W/ TAH       SOCIAL HISTORY:  Social History   Socioeconomic History  . Marital status: Divorced    Spouse name: Not on file  . Number of children: 3  . Years of education: 23  . Highest education level: Not on file  Occupational History  . Occupation: Estate agent    Comment: [employer]  . Financial resource strain: Not hard at all  . Food insecurity    Worry: Never true    Inability: Never true  . Transportation needs    Medical: No    Non-medical: No  Tobacco Use  . Smoking status: Never Smoker  . Smokeless tobacco: Never Used  Substance and Sexual Activity  . Alcohol use: No  . Drug use: No  . Sexual activity: Not Currently    Birth control/protection: Surgical    Comment: hyst  Lifestyle  . Physical activity    Days per week: 0 days    Minutes per session: 0 min  . Stress: Not at all  Relationships  . Social connections    Talks on phone: More than three times a week    Gets together: More than three times a week    Attends religious service: More than 4 times per year    Active member of club or organization: Yes    Attends meetings of clubs or organizations: More than 4 times per year    Relationship status: Divorced  . Intimate partner violence    Fear of current or ex partner: No    Emotionally abused: No    Physically abused: No    Forced sexual activity: No  Other Topics Concern  . Not on file  Social History Narrative   Originally from [place]. Always lived in [place]. Prior travel to [place]. 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 She has 1 dog currently. She has 1 conures (small parrots). No mold exposure in her home. 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 No hot tub exposure. She enjoys sewing & quilting.     FAMILY HISTORY:  Family History  Problem Relation Age of Onset  . Emphysema Mother   . Stroke Mother   . COPD Mother   . Heart disease Mother        died in sleep  53  . Allergies Father   . Asthma Father        as a child  . Arthritis Father   . Parkinson's disease Father 20  . Leukemia Maternal Grandmother   . Cancer Maternal Grandmother        Leukemia  . Diabetes Brother   . Heart attack Brother   . Hypertension  Brother   . Heart disease Brother 40       stents  . Hypertension Sister   . Hypertension Brother     CURRENT MEDICATIONS:  Outpatient Encounter Medications as of 08/28/2018  Medication Sig  . acetaminophen (TYLENOL) 500 MG tablet Take 1,000 mg by mouth every 6 (six) hours as needed for moderate pain or headache.   [name] Kitchen acyclovir (ZOVIRAX) 400 MG tablet TAKE ONE TABLET BY MOUTH TWICE DAILY.  [name] Kitchen albuterol (PROAIR HFA) 108 (90 Base) MCG/ACT inhaler Inhale 2 puffs into the lungs every 6 (six) hours as needed. (Patient taking differently: Inhale 2 puffs into the lungs every 6 (six) hours as needed for wheezing or shortness of breath. )  . albuterol (PROVENTIL) (2.5 MG/3ML) 0.083% nebulizer solution Take 2.5 mg by nebulization every 4 (four) hours as needed for wheezing or shortness of breath.  [name] Kitchen alendronate (FOSAMAX) 70 MG tablet Take 1 tablet (70 mg total) by mouth once a week. Take with a full glass of water on an empty stomach. (Patient taking differently: Take 70 mg by mouth every Sunday. Take with a full glass of water on an empty stomach.)  . amphetamine-dextroamphetamine (ADDERALL) 10 MG tablet Take 10 mg by mouth every morning.   . Armodafinil (NUVIGIL) 150 MG tablet Take 150 mg by mouth every morning.   . budesonide-formoterol (SYMBICORT) 160-4.5 MCG/ACT inhaler Inhale 2 puffs into the lungs 2 (two) times daily.  . Cholecalciferol (VITAMIN D3 PO) Take 1 capsule by  mouth at bedtime.  . citalopram (CELEXA) 20 MG tablet Take 20 mg by mouth every morning.    . cyanocobalamin 1000 MCG tablet Take 1,000 mcg by mouth daily.  [name] Kitchen doxycycline (VIBRAMYCIN) 100 MG capsule Take 100 mg by mouth 2 (two) times daily. 10 day course starting on 02/09/2018-COMPLETED COURSE  . furosemide (LASIX) 20 MG tablet Take 1 tablet (20 mg total) by mouth as needed for fluid.  [name] Kitchen gabapentin (NEURONTIN) 300 MG capsule Take 300 mg by mouth 2 (two) times daily.   [name] Kitchen gabapentin (NEURONTIN) 600 MG tablet   . mupirocin ointment (BACTROBAN) 2 %   . omeprazole (PRILOSEC) 20 MG capsule TAKE ONE CAPSULE BY MOUTH TWICE DAILY BEFORE A MEAL. (Patient taking differently: Take 20 mg by mouth 2 (two) times daily. )  . predniSONE (DELTASONE) 5 MG tablet TAKE 1 TABLET BY MOUTH ONCE A DAY.  . simvastatin (ZOCOR) 80 MG tablet Take 1 tablet (80 mg total) by mouth at bedtime.  [name] Kitchen tiotropium (SPIRIVA) 18 MCG inhalation capsule Place 1 capsule (18 mcg total) into inhaler and inhale daily.  . zafirlukast (ACCOLATE) 20 MG tablet TAKE 1 TABLET BY MOUTH TWICE DAILY.   Facility-Administered Encounter Medications as of 08/28/2018  Medication  . 0.9 %  sodium chloride infusion  . 0.9 %  sodium chloride infusion  . acetaminophen (TYLENOL) tablet 650 mg  . acetaminophen (TYLENOL) tablet 650 mg  . dextrose 5 % solution  . dextrose 5 % solution  . dextrose 5 % solution  . diphenhydrAMINE (BENADRYL) capsule 25 mg  . diphenhydrAMINE (BENADRYL) capsule 25 mg  . sodium chloride 0.9 % injection 10 mL    ALLERGIES:  Allergies  Allergen Reactions  . Meperidine Hcl Anaphylaxis  . Montelukast Sodium Hives and Rash     PHYSICAL EXAM:  ECOG Performance status: 1  Vitals:   08/28/18 0903  BP: 136/86  Pulse: 83  Resp: 16  Temp: (!) 97.4 F (36.3 C)  SpO2: 100%   Filed Weights  08/28/18 0903  Weight: 133 lb (60.3 kg)    Physical Exam Constitutional:      Appearance: Normal appearance. She is normal  weight.  Cardiovascular:     Rate and Rhythm: Normal rate and regular rhythm.     Heart sounds: Normal heart sounds.  Pulmonary:     Effort: Pulmonary effort is normal.     Breath sounds: Wheezing present.  Abdominal:     General: Bowel sounds are normal.     Palpations: Abdomen is soft.  Musculoskeletal: Normal range of motion.  Skin:    General: Skin is warm and dry.  Neurological:     Mental Status: She is alert and oriented to person, place, and time. Mental status is at baseline.  Psychiatric:        Mood and Affect: Mood normal.        Behavior: Behavior normal.        Thought Content: Thought content normal.        Judgment: Judgment normal.      LABORATORY DATA:  I have reviewed the labs as listed.  CBC    Component Value Date/Time   WBC 8.3 08/27/2018 0918   RBC 3.89 08/27/2018 0918   HGB 11.3 (L) 08/27/2018 0918   HCT 37.4 08/27/2018 0918   PLT 187 08/27/2018 0918   MCV 96.1 08/27/2018 0918   MCH 29.0 08/27/2018 0918   MCHC 30.2 08/27/2018 0918   RDW 13.4 08/27/2018 0918   LYMPHSABS 1.6 08/27/2018 0918   MONOABS 0.7 08/27/2018 0918   EOSABS 0.2 08/27/2018 0918   BASOSABS 0.0 08/27/2018 0918   CMP Latest Ref Rng & Units 08/27/2018 07/02/2018 04/30/2018  Glucose 70 - 99 mg/dL 84 91 100(H)  BUN 8 - 23 mg/dL 12 12 13   Creatinine 0.44 - 1.00 mg/dL 0.78 0.93 0.86  Sodium 135 - 145 mmol/L 140 142 140  Potassium 3.5 - 5.1 mmol/L 3.5 3.2(L) 3.7  Chloride 98 - 111 mmol/L 101 100 101  CO2 22 - 32 mmol/L 28 30 28   Calcium 8.9 - 10.3 mg/dL 8.8(L) 8.8(L) 9.2  Total Protein 6.5 - 8.1 g/dL 6.8 6.9 7.4  Total Bilirubin 0.3 - 1.2 mg/dL 0.4 0.5 0.5  Alkaline Phos 38 - 126 U/L 101 116 119  AST 15 - 41 U/L 18 18 19   ALT 0 - 44 U/L 14 13 13    I personally performed a face-to-face visit.  All questions were answered to patient's stated satisfaction. Encouraged patient to call with any new concerns or questions before his next visit to the cancer center and we can certain  see him sooner, if needed.     ASSESSMENT & PLAN:   Non Hodgkin's lymphoma ([place]) 1. Acquired hypogammaglobulinemia: -She started off receiving IVIG every 4 weeks.  It was switched to every 6 weeks in July 2019. We now switched her to every 8 weeks.  She has been tolerating this very well. -She takes 5 mg of prednisone daily for her breathing. - Last IVIG was given 07/03/2018. -She was seen at her primary care doctor the first week in February and received antibiotics for sinus infection.  She also had one trip to the ER 02/23/2018 for IV fluids due to a virus. -She denies any new infections since her last visit.  She still uses her inhalers and takes prednisone for her breathing.  - Her blood work was reviewed and labs on 08/28/2018 showed her IgG trough level was 1,177. - She will receive IVIG infusion  today. -She will continue to get her IVIG infusions every 2 months with labs. -We will see her back in 4 months for follow-up with labs.  2. Diffuse large B-cell lymphoma, stage IVb: -She was diagnosed 07/08/2009.  She was treated with 4 cycles of R-CHOP with intrathecal methotrexate during cycles 3 and 4.she developed pseudomonas sepsis after cycles 2 and 4 and therapy was stopped after 4 cycles.  She also received 2 years of maintenance rituximab. - PET CT scan on 03/20/2017 did not show any evidence of lymphoma. - Today's exam did not reveal any palpable adenopathy or splenomegaly. -Labs on 08/28/2018 showed her LDH 166. - We will do scans of clinical conditions dictates.  3. Left lung nodule: - PET/CT from 03/2017 revealed no evidence of lung nodule.  4. Nodule on spleen: - CT of abdomen pelvis on 02/23/2018 revealed a new 5 mm probably benign hypodensity within the anterior spleen. - It suggest 6 to 60-month MRI follow-up to document stability. - Denies any abdominal pain.  No splenomegaly on exam. -We will repeat an MRI of her abdomen with her next visit in 4 months.  5. Health  maintenance: - She had her mammogram on 09/08/2017 which was BI-RADS 1.      Orders placed this encounter:  Orders Placed This Encounter  Procedures  . MR Abdomen W Wo Contrast  . CBC with Differential/Platelet  . Comprehensive metabolic panel  . IgG, IgA, IgM  . Lactate dehydrogenase      Francene Finders, FNP-C [place] [phone]

## 2018-09-03 ENCOUNTER — Other Ambulatory Visit (HOSPITAL_COMMUNITY): Payer: Self-pay | Admitting: Hematology

## 2018-09-03 DIAGNOSIS — D801 Nonfamilial hypogammaglobulinemia: Secondary | ICD-10-CM

## 2018-09-19 ENCOUNTER — Other Ambulatory Visit: Payer: Self-pay | Admitting: Emergency Medicine

## 2018-10-01 DIAGNOSIS — Z1389 Encounter for screening for other disorder: Secondary | ICD-10-CM | POA: Diagnosis not present

## 2018-10-01 DIAGNOSIS — Z6823 Body mass index (BMI) 23.0-23.9, adult: Secondary | ICD-10-CM | POA: Diagnosis not present

## 2018-10-01 DIAGNOSIS — N183 Chronic kidney disease, stage 3 (moderate): Secondary | ICD-10-CM | POA: Diagnosis not present

## 2018-10-01 DIAGNOSIS — E119 Type 2 diabetes mellitus without complications: Secondary | ICD-10-CM | POA: Diagnosis not present

## 2018-10-01 DIAGNOSIS — Z23 Encounter for immunization: Secondary | ICD-10-CM | POA: Diagnosis not present

## 2018-10-01 DIAGNOSIS — J449 Chronic obstructive pulmonary disease, unspecified: Secondary | ICD-10-CM | POA: Diagnosis not present

## 2018-10-01 DIAGNOSIS — Z0001 Encounter for general adult medical examination with abnormal findings: Secondary | ICD-10-CM | POA: Diagnosis not present

## 2018-10-22 ENCOUNTER — Ambulatory Visit (HOSPITAL_COMMUNITY): Payer: Medicare Other

## 2018-10-22 ENCOUNTER — Inpatient Hospital Stay (HOSPITAL_COMMUNITY): Payer: Medicare Other | Attending: Hematology

## 2018-10-22 ENCOUNTER — Other Ambulatory Visit: Payer: Self-pay

## 2018-10-22 DIAGNOSIS — C8338 Diffuse large B-cell lymphoma, lymph nodes of multiple sites: Secondary | ICD-10-CM

## 2018-10-22 DIAGNOSIS — D801 Nonfamilial hypogammaglobulinemia: Secondary | ICD-10-CM | POA: Diagnosis present

## 2018-10-22 LAB — CBC WITH DIFFERENTIAL/PLATELET
Abs Immature Granulocytes: 0.02 10*3/uL (ref 0.00–0.07)
Basophils Absolute: 0.1 10*3/uL (ref 0.0–0.1)
Basophils Relative: 1 %
Eosinophils Absolute: 0.1 10*3/uL (ref 0.0–0.5)
Eosinophils Relative: 1 %
HCT: 35.9 % — ABNORMAL LOW (ref 36.0–46.0)
Hemoglobin: 10.9 g/dL — ABNORMAL LOW (ref 12.0–15.0)
Immature Granulocytes: 0 %
Lymphocytes Relative: 15 %
Lymphs Abs: 1.3 10*3/uL (ref 0.7–4.0)
MCH: 29.1 pg (ref 26.0–34.0)
MCHC: 30.4 g/dL (ref 30.0–36.0)
MCV: 96 fL (ref 80.0–100.0)
Monocytes Absolute: 0.6 10*3/uL (ref 0.1–1.0)
Monocytes Relative: 7 %
Neutro Abs: 6.4 10*3/uL (ref 1.7–7.7)
Neutrophils Relative %: 76 %
Platelets: 225 10*3/uL (ref 150–400)
RBC: 3.74 MIL/uL — ABNORMAL LOW (ref 3.87–5.11)
RDW: 13.5 % (ref 11.5–15.5)
WBC: 8.4 10*3/uL (ref 4.0–10.5)
nRBC: 0 % (ref 0.0–0.2)

## 2018-10-22 LAB — COMPREHENSIVE METABOLIC PANEL
ALT: 13 U/L (ref 0–44)
AST: 18 U/L (ref 15–41)
Albumin: 3.5 g/dL (ref 3.5–5.0)
Alkaline Phosphatase: 109 U/L (ref 38–126)
Anion gap: 10 (ref 5–15)
BUN: 14 mg/dL (ref 8–23)
CO2: 29 mmol/L (ref 22–32)
Calcium: 8.9 mg/dL (ref 8.9–10.3)
Chloride: 100 mmol/L (ref 98–111)
Creatinine, Ser: 0.94 mg/dL (ref 0.44–1.00)
GFR calc Af Amer: >60 mL/min (ref >60)
GFR calc non Af Amer: >60 mL/min (ref >60)
Glucose, Bld: 113 mg/dL — ABNORMAL HIGH (ref 70–99)
Potassium: 4.1 mmol/L (ref 3.5–5.1)
Sodium: 139 mmol/L (ref 135–145)
Total Bilirubin: 0.5 mg/dL (ref 0.3–1.2)
Total Protein: 6.8 g/dL (ref 6.5–8.1)

## 2018-10-22 LAB — LACTATE DEHYDROGENASE: LDH: 162 U/L (ref 98–192)

## 2018-10-23 ENCOUNTER — Other Ambulatory Visit: Payer: Self-pay

## 2018-10-23 ENCOUNTER — Inpatient Hospital Stay (HOSPITAL_COMMUNITY): Payer: Medicare Other

## 2018-10-23 VITALS — BP 123/66 | HR 63 | Temp 97.8°F | Resp 18 | Wt 135.0 lb

## 2018-10-23 DIAGNOSIS — D801 Nonfamilial hypogammaglobulinemia: Secondary | ICD-10-CM | POA: Diagnosis not present

## 2018-10-23 MED ORDER — IMMUNE GLOBULIN (HUMAN) 10 GM/100ML IV SOLN
30.0000 g | Freq: Once | INTRAVENOUS | Status: AC
  Administered 2018-10-23: 10:00:00 30 g via INTRAVENOUS
  Filled 2018-10-23: qty 200

## 2018-10-23 MED ORDER — ACETAMINOPHEN 325 MG PO TABS: 650.0000 mg | ORAL_TABLET | Freq: Four times a day (QID) | ORAL | Status: DC | PRN

## 2018-10-23 MED ORDER — DIPHENHYDRAMINE HCL 25 MG PO CAPS: 25.0000 mg | ORAL_CAPSULE | Freq: Once | ORAL | Status: DC

## 2018-10-23 MED ORDER — DEXTROSE 5 % IV SOLN
INTRAVENOUS | Status: DC
  Administered 2018-10-23: 09:00:00 via INTRAVENOUS

## 2018-10-23 MED ORDER — SODIUM CHLORIDE 0.9% FLUSH
10.0000 mL | Freq: Once | INTRAVENOUS | Status: AC
  Administered 2018-10-23: 10 mL via INTRAVENOUS

## 2018-10-23 NOTE — Progress Notes (Signed)
Patient to treatment room for IVIG today.  No complaints voiced and no s/s of distress noted.    Patient tolerated IVIG with no complaints voiced.  Peripheral IV site clean and dry with good blood return noted before and after infusion. No bruising or swelling noted at site.  Band aid applied.  VSs with discharge and left ambulatory with no s/s of distress noted.

## 2018-10-24 LAB — IGG, IGA, IGM
IgA: <5 mg/dL — ABNORMAL LOW (ref 87–352)
IgG (Immunoglobin G), Serum: 1238 mg/dL (ref 586–1602)
IgM (Immunoglobulin M), Srm: 135 mg/dL (ref 26–217)

## 2018-10-26 ENCOUNTER — Other Ambulatory Visit (HOSPITAL_COMMUNITY): Payer: Self-pay | Admitting: Adult Health

## 2018-10-26 DIAGNOSIS — Z1231 Encounter for screening mammogram for malignant neoplasm of breast: Secondary | ICD-10-CM

## 2018-10-29 ENCOUNTER — Other Ambulatory Visit: Payer: Self-pay | Admitting: Emergency Medicine

## 2018-11-08 ENCOUNTER — Ambulatory Visit (HOSPITAL_COMMUNITY)
Admission: RE | Admit: 2018-11-08 | Discharge: 2018-11-08 | Disposition: A | Discharge status: Home / Self Care | Payer: Medicare Other | Source: Ambulatory Visit | Attending: Adult Health | Admitting: Adult Health

## 2018-11-08 ENCOUNTER — Other Ambulatory Visit: Payer: Self-pay

## 2018-11-08 DIAGNOSIS — Z1231 Encounter for screening mammogram for malignant neoplasm of breast: Secondary | ICD-10-CM

## 2018-11-10 IMAGING — PT NM PET TUM IMG RESTAG (PS) SKULL BASE T - THIGH
3 series · 23 of 25 positions shown · non-contrast
Comparison: PET-CT dated 06/24/2013

CLINICAL DATA: Subsequent treatment strategy for diffuse large
B-cell lymphoma.

EXAM:
NUCLEAR MEDICINE PET SKULL BASE TO THIGH
TECHNIQUE: 10.3 mCi F-18 FDG was injected intravenously. Full-ring PET imaging
was performed from the skull base to thigh after the radiotracer. CT
data was obtained and used for attenuation correction and anatomic
localization.
Fasting blood glucose: 78 mg/dl
Mediastinal blood pool activity: SUV max

[axial ct wb fusion · 16 of 158 slices shown]
[im 1/158]
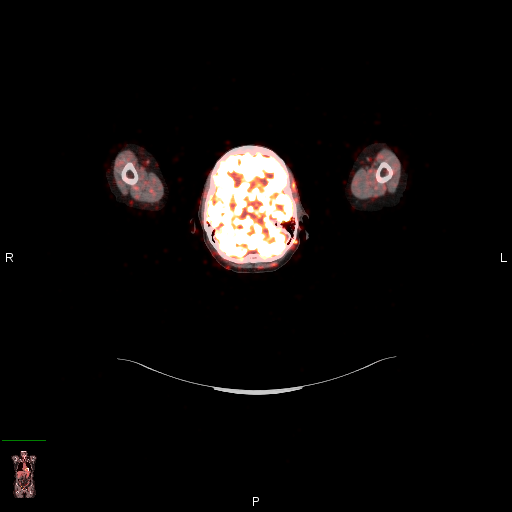
[im 10/158]
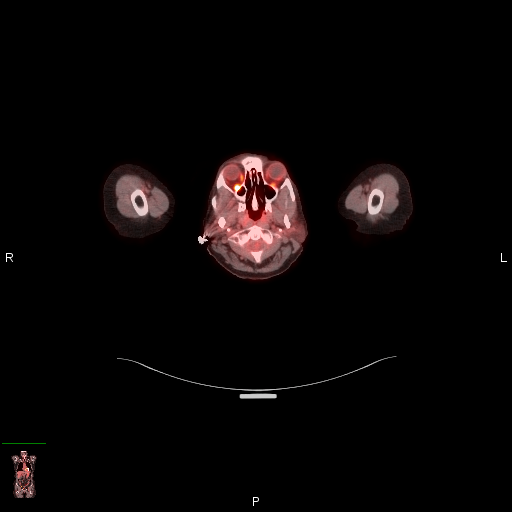
[im 20/158]
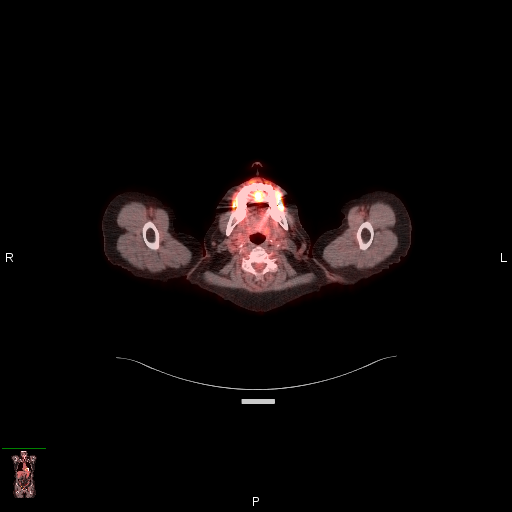
[im 30/158]
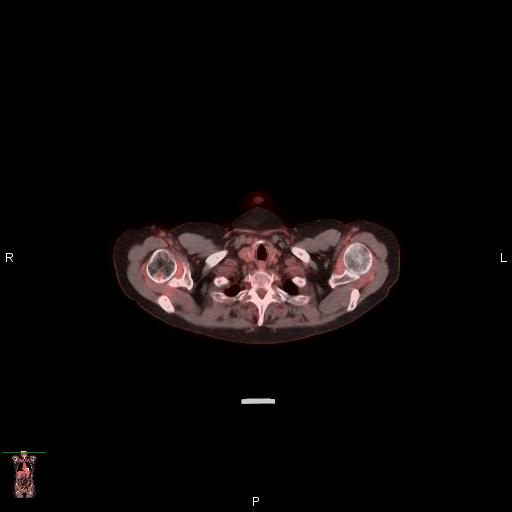
[im 40/158]
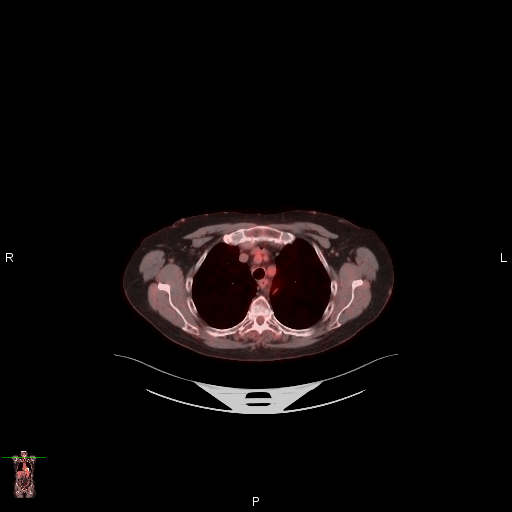
[im 50/158]
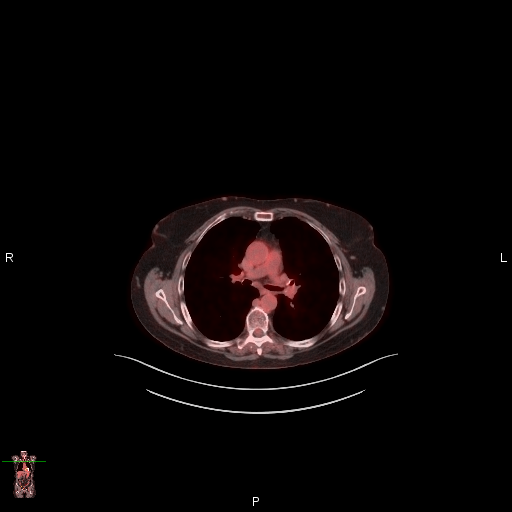
[im 69/158]
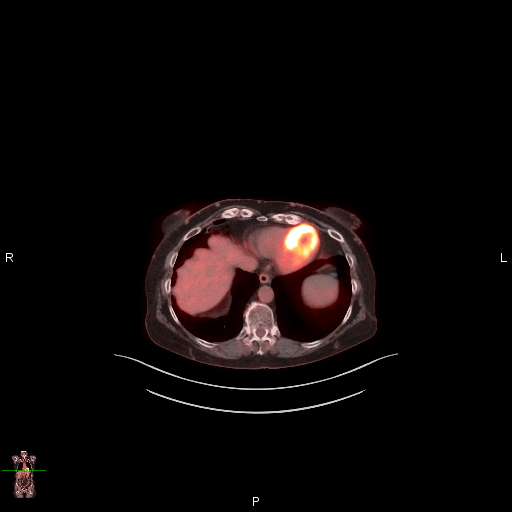
[im 79/158]
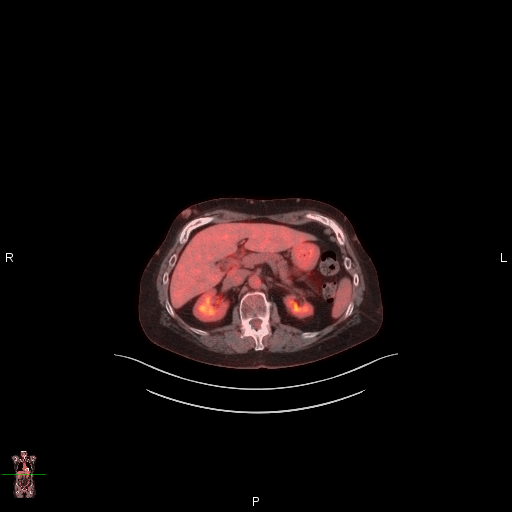
[im 89/158]
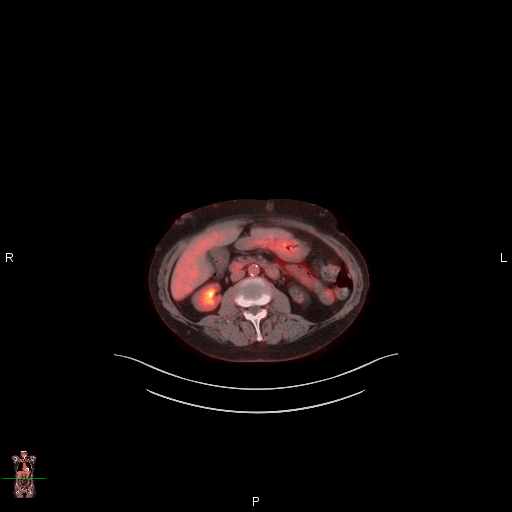
[im 99/158]
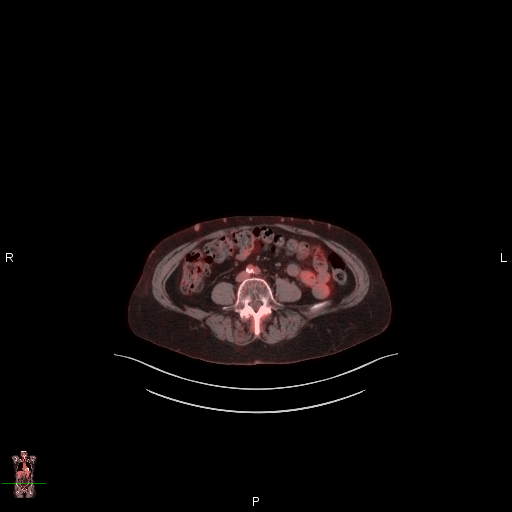
[im 108/158]
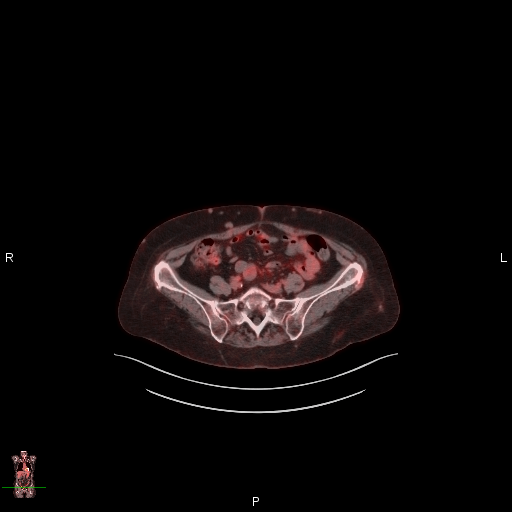
[im 118/158]
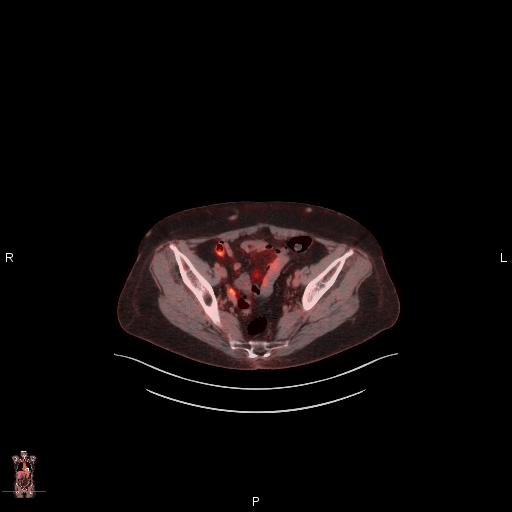
[im 128/158]
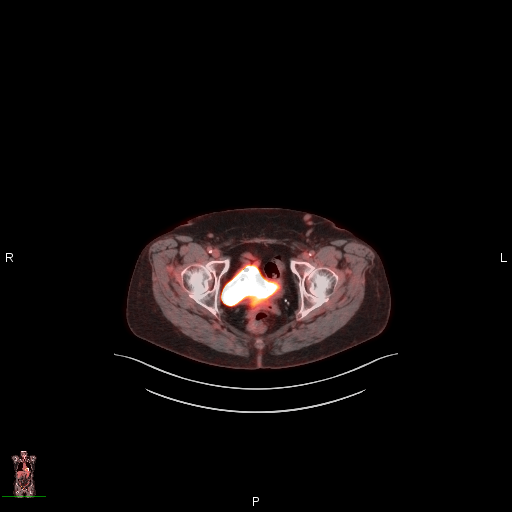
[im 138/158]
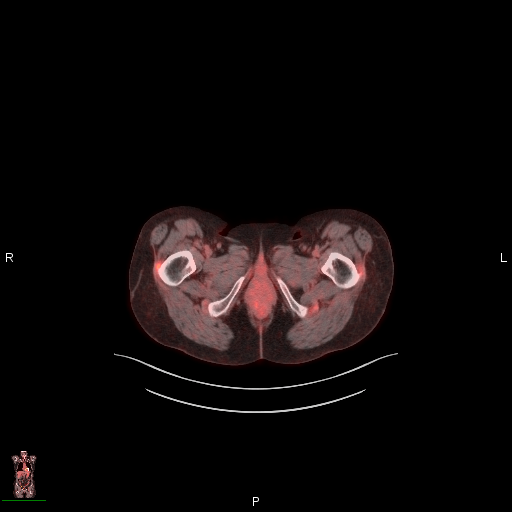
[im 148/158]
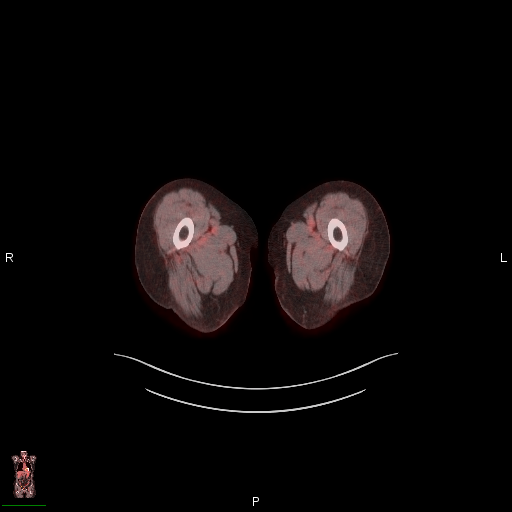
[im 158/158]
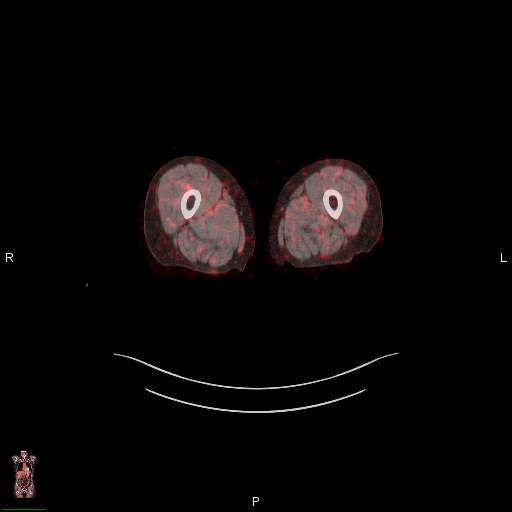

[coronal ct wb fusion · 2 of 23 slices shown]
[im 1/23]
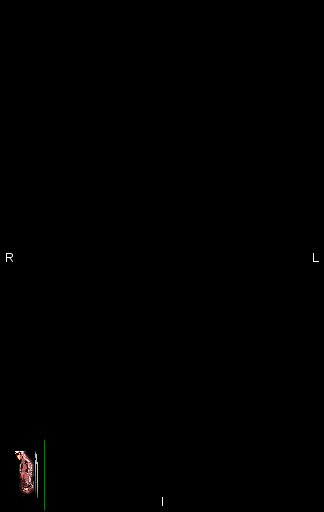
[im 23/23]
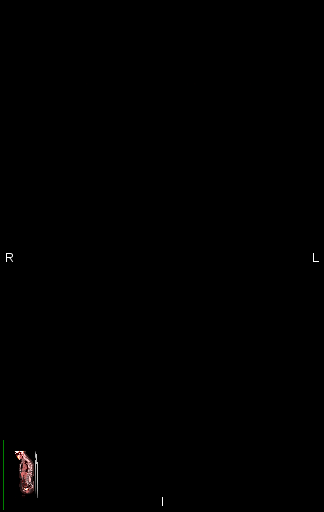

[mip · 5 of 48 slices shown]
[im 1/48]
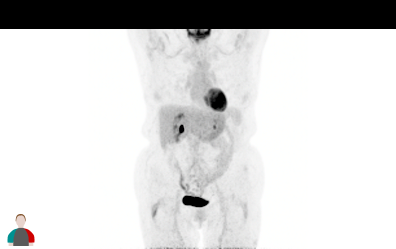
[im 12/48]
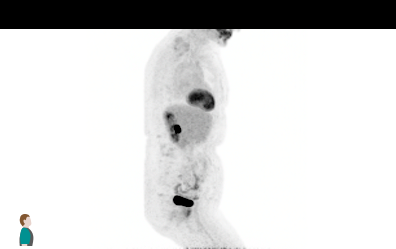
[im 24/48]
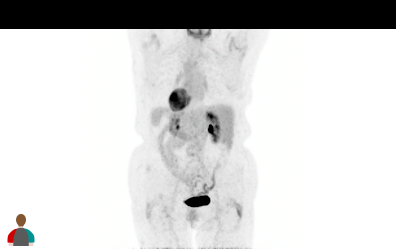
[im 36/48]
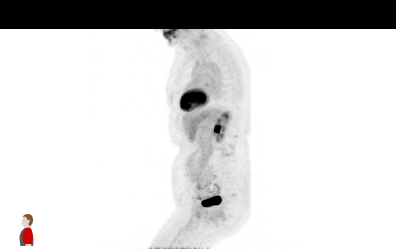
[im 48/48]
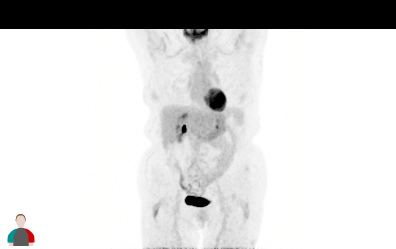

[23 of 25 positions shown; findings below may reference images not displayed]

FINDINGS: NECK: No hypermetabolic cervical lymphadenopathy.

Incidental CT findings: none

CHEST: No hypermetabolic pulmonary nodules.

No hypermetabolic thoracic lymphadenopathy.

Incidental CT findings: Scattered areas of bronchiectasis with
tree-in-bud nodularity, right middle lobe, lingular, and medial
right lower lobe predominant, likely related to chronic atypical
mycobacterial infection such as DARK. Atherosclerotic calcifications
of the aortic arch.

ABDOMEN/PELVIS: No hypermetabolic abdominopelvic lymphadenopathy.

No abnormal hypermetabolism in the liver, spleen, pancreas, or
adrenal glands. Spleen is normal in size.

Incidental CT findings: Left renal scarring/atrophy. Atherosclerotic
calcifications of the abdominal aorta and branch vessels. Status
post hysterectomy.

SKELETON: No focal hypermetabolic activity to suggest skeletal
metastasis.

Incidental CT findings: none
IMPRESSION: No findings to suggest active lymphoma.

## 2018-11-21 ENCOUNTER — Other Ambulatory Visit (HOSPITAL_COMMUNITY): Payer: Self-pay | Admitting: Hematology

## 2018-11-21 DIAGNOSIS — D801 Nonfamilial hypogammaglobulinemia: Secondary | ICD-10-CM

## 2018-11-28 ENCOUNTER — Other Ambulatory Visit: Payer: Self-pay | Admitting: Emergency Medicine

## 2018-12-13 ENCOUNTER — Other Ambulatory Visit: Payer: Self-pay

## 2018-12-13 ENCOUNTER — Ambulatory Visit (HOSPITAL_COMMUNITY)
Admission: RE | Admit: 2018-12-13 | Discharge: 2018-12-13 | Disposition: A | Discharge status: Home / Self Care | Payer: Medicare Other | Source: Ambulatory Visit | Attending: Nurse Practitioner | Admitting: Nurse Practitioner

## 2018-12-13 DIAGNOSIS — N281 Cyst of kidney, acquired: Secondary | ICD-10-CM | POA: Diagnosis not present

## 2018-12-13 DIAGNOSIS — K8020 Calculus of gallbladder without cholecystitis without obstruction: Secondary | ICD-10-CM | POA: Diagnosis not present

## 2018-12-13 DIAGNOSIS — C8338 Diffuse large B-cell lymphoma, lymph nodes of multiple sites: Secondary | ICD-10-CM | POA: Insufficient documentation

## 2018-12-13 LAB — POCT I-STAT CREATININE: Creatinine, Ser: 0.9 mg/dL (ref 0.44–1.00)

## 2018-12-13 MED ORDER — GADOBUTROL 1 MMOL/ML IV SOLN
7.0000 mL | Freq: Once | INTRAVENOUS | Status: AC | PRN
  Administered 2018-12-13: 08:00:00 7 mL via INTRAVENOUS

## 2018-12-17 ENCOUNTER — Encounter (HOSPITAL_COMMUNITY): Payer: Self-pay

## 2018-12-17 ENCOUNTER — Inpatient Hospital Stay (HOSPITAL_COMMUNITY): Payer: Medicare Other | Attending: Hematology

## 2018-12-17 ENCOUNTER — Other Ambulatory Visit: Payer: Self-pay

## 2018-12-17 DIAGNOSIS — R911 Solitary pulmonary nodule: Secondary | ICD-10-CM | POA: Diagnosis not present

## 2018-12-17 DIAGNOSIS — D801 Nonfamilial hypogammaglobulinemia: Secondary | ICD-10-CM | POA: Insufficient documentation

## 2018-12-17 DIAGNOSIS — Z8572 Personal history of non-Hodgkin lymphomas: Secondary | ICD-10-CM | POA: Insufficient documentation

## 2018-12-17 LAB — COMPREHENSIVE METABOLIC PANEL
ALT: 15 U/L (ref 0–44)
AST: 21 U/L (ref 15–41)
Albumin: 3.5 g/dL (ref 3.5–5.0)
Alkaline Phosphatase: 122 U/L (ref 38–126)
Anion gap: 10 (ref 5–15)
BUN: 11 mg/dL (ref 8–23)
CO2: 30 mmol/L (ref 22–32)
Calcium: 8.9 mg/dL (ref 8.9–10.3)
Chloride: 102 mmol/L (ref 98–111)
Creatinine, Ser: 0.94 mg/dL (ref 0.44–1.00)
GFR calc Af Amer: >60 mL/min (ref >60)
GFR calc non Af Amer: >60 mL/min (ref >60)
Glucose, Bld: 75 mg/dL (ref 70–99)
Potassium: 3.6 mmol/L (ref 3.5–5.1)
Sodium: 142 mmol/L (ref 135–145)
Total Bilirubin: 0.4 mg/dL (ref 0.3–1.2)
Total Protein: 7 g/dL (ref 6.5–8.1)

## 2018-12-17 LAB — CBC WITH DIFFERENTIAL/PLATELET
Abs Immature Granulocytes: 0.01 10*3/uL (ref 0.00–0.07)
Basophils Absolute: 0 10*3/uL (ref 0.0–0.1)
Basophils Relative: 1 %
Eosinophils Absolute: 0.1 10*3/uL (ref 0.0–0.5)
Eosinophils Relative: 3 %
HCT: 40.1 % (ref 36.0–46.0)
Hemoglobin: 12.1 g/dL (ref 12.0–15.0)
Immature Granulocytes: 0 %
Lymphocytes Relative: 27 %
Lymphs Abs: 1.3 10*3/uL (ref 0.7–4.0)
MCH: 28.7 pg (ref 26.0–34.0)
MCHC: 30.2 g/dL (ref 30.0–36.0)
MCV: 95 fL (ref 80.0–100.0)
Monocytes Absolute: 0.4 10*3/uL (ref 0.1–1.0)
Monocytes Relative: 8 %
Neutro Abs: 2.8 10*3/uL (ref 1.7–7.7)
Neutrophils Relative %: 61 %
Platelets: 200 10*3/uL (ref 150–400)
RBC: 4.22 MIL/uL (ref 3.87–5.11)
RDW: 12.9 % (ref 11.5–15.5)
WBC: 4.7 10*3/uL (ref 4.0–10.5)
nRBC: 0 % (ref 0.0–0.2)

## 2018-12-17 LAB — LACTATE DEHYDROGENASE: LDH: 176 U/L (ref 98–192)

## 2018-12-18 ENCOUNTER — Encounter (HOSPITAL_COMMUNITY): Payer: Self-pay | Admitting: Nurse Practitioner

## 2018-12-18 ENCOUNTER — Inpatient Hospital Stay (HOSPITAL_BASED_OUTPATIENT_CLINIC_OR_DEPARTMENT_OTHER): Payer: Medicare Other | Admitting: Nurse Practitioner

## 2018-12-18 ENCOUNTER — Inpatient Hospital Stay (HOSPITAL_COMMUNITY): Payer: Medicare Other

## 2018-12-18 VITALS — BP 155/69 | HR 64 | Temp 98.0°F | Resp 16

## 2018-12-18 DIAGNOSIS — C8338 Diffuse large B-cell lymphoma, lymph nodes of multiple sites: Secondary | ICD-10-CM

## 2018-12-18 DIAGNOSIS — D801 Nonfamilial hypogammaglobulinemia: Secondary | ICD-10-CM

## 2018-12-18 DIAGNOSIS — R911 Solitary pulmonary nodule: Secondary | ICD-10-CM | POA: Diagnosis not present

## 2018-12-18 DIAGNOSIS — Z8572 Personal history of non-Hodgkin lymphomas: Secondary | ICD-10-CM | POA: Diagnosis not present

## 2018-12-18 LAB — IGG, IGA, IGM
IgA: <5 mg/dL — ABNORMAL LOW (ref 87–352)
IgG (Immunoglobin G), Serum: 1216 mg/dL (ref 586–1602)
IgM (Immunoglobulin M), Srm: 96 mg/dL (ref 26–217)

## 2018-12-18 MED ORDER — ACETAMINOPHEN 325 MG PO TABS: 650.0000 mg | ORAL_TABLET | Freq: Four times a day (QID) | ORAL | Status: DC | PRN

## 2018-12-18 MED ORDER — DEXTROSE 5 % IV SOLN: INTRAVENOUS | Status: DC

## 2018-12-18 MED ORDER — IMMUNE GLOBULIN (HUMAN) 10 GM/100ML IV SOLN
30.0000 g | Freq: Once | INTRAVENOUS | Status: AC
  Administered 2018-12-18: 30 g via INTRAVENOUS
  Filled 2018-12-18: qty 200

## 2018-12-18 MED ORDER — DIPHENHYDRAMINE HCL 25 MG PO CAPS: 25.0000 mg | ORAL_CAPSULE | Freq: Once | ORAL | Status: DC

## 2018-12-18 NOTE — Progress Notes (Signed)
[name], [place] [address]   CLINIC:  Medical Oncology/Hematology  PCP:  Sharilyn Sites, MD [address] [place] [id number] [phone]   REASON FOR VISIT: Follow-up for non-Hodgkin's lymphoma and hypogammaglobulinemia  CURRENT THERAPY: Observation and IVIG  BRIEF ONCOLOGIC HISTORY:  Oncology History  Non Hodgkin's lymphoma ([place])  09/09/2009 Initial Diagnosis   Non Hodgkin's lymphoma ([place])      CANCER STAGING: Cancer Staging Non Hodgkin's lymphoma ([place]) Staging form: Lymphoid Neoplasms, AJCC 6th Edition - Clinical: Stage IV - Signed by Baird Cancer, PA on 11/05/2010    INTERVAL HISTORY:  [name] 63 y.o. female returns for routine follow-up non-Hodgkin's lymphoma and hypogammaglobulinemia.  Patient reports she has been doing well since her last visit.  She denies any new infections.  She denies any pain or unexplained weight loss. Denies any nausea, vomiting, or diarrhea. Denies any new pains. Had not noticed any recent bleeding such as epistaxis, hematuria or hematochezia. Denies recent chest pain on exertion, shortness of breath on minimal exertion, pre-syncopal episodes, or palpitations. Denies any numbness or tingling in hands or feet. Denies any recent fevers, infections, or recent hospitalizations. Patient reports appetite at 100% and energy level at 100%.  She is eating well maintain her weight this time.   REVIEW OF SYSTEMS:  Review of Systems  All other systems reviewed and are negative.    PAST MEDICAL/SURGICAL HISTORY:  Past Medical History:  Diagnosis Date  . Allergic rhinitis   . Allergy   . Anemia   . Anxiety   . Asthma   . B12 deficiency 01/18/2015  . Blood transfusion without reported diagnosis   . Cataract   . Cavitary lung disease   . Chronic kidney disease    related to cancer  . Chronic sinusitis   . Clotting disorder ([place])   . COPD (chronic obstructive pulmonary disease) ([place])   .  Depression   . DVT (deep venous thrombosis) ([place]) 09/25/2014  . Endotracheally intubated   . GERD (gastroesophageal reflux disease)   . Hematuria 04/07/2016  . Hyperlipidemia   . Hypertension   . Hypogammaglobulinemia, acquired ([place]) 12/30/2009   Qualifier: Diagnosis of  By: Joya Gaskins MD, Burnett Harry   . Iron deficiency anemia 01/18/2015  . Myocardial infarction ([place]) 2011  . 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 Non Hodgkin's lymphoma ([place]) 2011  . Orofacial dyskinesia 04/22/2015  . Orofacial dyskinesia 04/22/2015  . Osteoporosis   . Peripheral neuropathy due to chemotherapy ([place]) 05/02/2016  . Pneumonia 01/2013  . PONV (postoperative nausea and vomiting)   . Respiratory failure ([place])   . Sleep apnea   . SVC syndrome 09/25/2014   Past Surgical History:  Procedure Laterality Date  . ABDOMINAL HYSTERECTOMY     Fibroids  . BASAL CELL CARCINOMA EXCISION  03/2011   scalp  . BREAST SURGERY Right    biopsy  . CATARACT EXTRACTION W/PHACO Left 11/17/2014   Procedure: CATARACT EXTRACTION PHACO AND INTRAOCULAR LENS PLACEMENT LEFT EYE;  Surgeon: Tonny Branch, MD;  Location: [facility];  Service: Ophthalmology;  Laterality: Left;  CDE:5.60  . CATARACT EXTRACTION W/PHACO Right 12/15/2014   Procedure: CATARACT EXTRACTION PHACO AND INTRAOCULAR LENS PLACEMENT RIGHT EYE CDE=5.16;  Surgeon: Tonny Branch, MD;  Location: [facility];  Service: Ophthalmology;  Laterality: Right;  . COLONOSCOPY N/A 11/14/2012   Procedure: COLONOSCOPY;  Surgeon: Rogene Houston, MD;  Location: [facility];  Service: Endoscopy;  Laterality: N/A;  830  . COLONOSCOPY  WITH PROPOFOL N/A 01/12/2018   Procedure: COLONOSCOPY WITH PROPOFOL;  Surgeon: Rogene Houston, MD;  Location: [facility];  Service: Endoscopy;  Laterality: N/A;  7:30  . EYE SURGERY    . NASAL SINUS SURGERY    . NECK SURGERY     x 2   . PERIPHERALLY INSERTED CENTRAL CATHETER INSERTION    . PICC Removal    . POLYPECTOMY  01/12/2018   Procedure: POLYPECTOMY;   Surgeon: Rogene Houston, MD;  Location: [facility];  Service: Endoscopy;;  colon  . PORT-A-CATH REMOVAL    . PORTACATH PLACEMENT  7/11   Removed 6/12  . TRACHEOSTOMY    . VESICOVAGINAL FISTULA CLOSURE W/ TAH       SOCIAL HISTORY:  Social History   Socioeconomic History  . Marital status: Divorced    Spouse name: Not on file  . Number of children: 3  . Years of education: 46  . Highest education level: Not on file  Occupational History  . Occupation: Estate agent    Comment: [employer]  . Smoking status: Never Smoker  . Smokeless tobacco: Never Used  Substance and Sexual Activity  . Alcohol use: No  . Drug use: No  . Sexual activity: Not Currently    Birth control/protection: Surgical    Comment: hyst  Other Topics Concern  . Not on file  Social History Narrative   Originally from [place]. Always lived in [place]. Prior travel to [place]. 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 She has 1 dog currently. She has 1 conures (small parrots). No mold exposure in her home. 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 No hot tub exposure. She enjoys sewing & quilting.    Social Determinants of Health   Financial Resource Strain:   . Difficulty of Paying Living Expenses: Not on file  Food Insecurity:   . Worried About Charity fundraiser in the Last Year: Not on file  . Ran Out of Food in the Last Year: Not on file  Transportation Needs:   . Lack of Transportation (Medical): Not on file  . Lack of Transportation (Non-Medical): Not on file  Physical Activity:   . Days of Exercise per Week: Not on file  . Minutes of Exercise per Session: Not on file  Stress:   . Feeling of Stress : Not on file  Social Connections:   . Frequency of Communication with Friends and Family: Not on file  . Frequency of Social Gatherings with Friends and Family: Not on file  . Attends Religious Services: Not on file  .  Active Member of Clubs or Organizations: Not on file  . Attends Archivist Meetings: Not on file  . Marital Status: Not on file  Intimate Partner Violence:   . Fear of Current or Ex-Partner: Not on file  . Emotionally Abused: Not on file  . Physically Abused: Not on file  . Sexually Abused: Not on file    FAMILY HISTORY:  Family History  Problem Relation Age of Onset  . Emphysema Mother   . Stroke Mother   . COPD Mother   . Heart disease Mother        died in sleep  76  . Allergies Father   . Asthma Father        as a child  . Arthritis Father   . Parkinson's  disease Father 76  . Leukemia Maternal Grandmother   . Cancer Maternal Grandmother        Leukemia  . Diabetes Brother   . Heart attack Brother   . Hypertension Brother   . Heart disease Brother 40       stents  . Hypertension Sister   . Hypertension Brother     CURRENT MEDICATIONS:  Outpatient Encounter Medications as of 12/18/2018  Medication Sig  . acetaminophen (TYLENOL) 500 MG tablet Take 1,000 mg by mouth every 6 (six) hours as needed for moderate pain or headache.   [name] Kitchen acyclovir (ZOVIRAX) 400 MG tablet TAKE ONE TABLET BY MOUTH TWICE DAILY.  [name] Kitchen albuterol (PROAIR HFA) 108 (90 Base) MCG/ACT inhaler Inhale 2 puffs into the lungs every 6 (six) hours as needed. (Patient taking differently: Inhale 2 puffs into the lungs every 6 (six) hours as needed for wheezing or shortness of breath. )  . albuterol (PROVENTIL) (2.5 MG/3ML) 0.083% nebulizer solution Take 2.5 mg by nebulization every 4 (four) hours as needed for wheezing or shortness of breath.  . amphetamine-dextroamphetamine (ADDERALL) 10 MG tablet Take 10 mg by mouth every morning.   . Armodafinil (NUVIGIL) 150 MG tablet Take 150 mg by mouth every morning.   . budesonide-formoterol (SYMBICORT) 160-4.5 MCG/ACT inhaler Inhale 2 puffs into the lungs 2 (two) times daily.  . Cholecalciferol (VITAMIN D3 PO) Take 1 capsule by mouth at bedtime.  . citalopram  (CELEXA) 20 MG tablet Take 20 mg by mouth every morning.    . cyanocobalamin 1000 MCG tablet Take 1,000 mcg by mouth daily.  . furosemide (LASIX) 20 MG tablet Take 1 tablet (20 mg total) by mouth as needed for fluid.  [name] Kitchen gabapentin (NEURONTIN) 300 MG capsule Take 300 mg by mouth 2 (two) times daily.   [name] Kitchen gabapentin (NEURONTIN) 600 MG tablet   . mupirocin ointment (BACTROBAN) 2 %   . omeprazole (PRILOSEC) 20 MG capsule TAKE ONE CAPSULE BY MOUTH TWICE DAILY BEFORE A MEAL. (Patient taking differently: Take 20 mg by mouth 2 (two) times daily. )  . predniSONE (DELTASONE) 5 MG tablet TAKE 1 TABLET BY MOUTH ONCE A DAY.  . simvastatin (ZOCOR) 80 MG tablet Take 1 tablet (80 mg total) by mouth at bedtime.  . zafirlukast (ACCOLATE) 20 MG tablet TAKE 1 TABLET BY MOUTH TWICE DAILY.  . [DISCONTINUED] alendronate (FOSAMAX) 70 MG tablet Take 1 tablet (70 mg total) by mouth once a week. Take with a full glass of water on an empty stomach. (Patient taking differently: Take 70 mg by mouth every Sunday. Take with a full glass of water on an empty stomach.)  . [DISCONTINUED] doxycycline (VIBRAMYCIN) 100 MG capsule Take 100 mg by mouth 2 (two) times daily. 10 day course starting on 02/09/2018-COMPLETED COURSE  . [DISCONTINUED] tiotropium (SPIRIVA) 18 MCG inhalation capsule Place 1 capsule (18 mcg total) into inhaler and inhale daily.   Facility-Administered Encounter Medications as of 12/18/2018  Medication  . 0.9 %  sodium chloride infusion  . 0.9 %  sodium chloride infusion  . acetaminophen (TYLENOL) tablet 650 mg  . acetaminophen (TYLENOL) tablet 650 mg  . dextrose 5 % solution  . dextrose 5 % solution  . dextrose 5 % solution  . diphenhydrAMINE (BENADRYL) capsule 25 mg  . diphenhydrAMINE (BENADRYL) capsule 25 mg  . sodium chloride 0.9 % injection 10 mL    ALLERGIES:  Allergies  Allergen Reactions  . Meperidine Hcl Anaphylaxis  . Montelukast Sodium Hives and Rash  PHYSICAL EXAM:  ECOG Performance  status: 1  Vitals:   12/18/18 0900  BP: (!) 145/79  Pulse: 79  Resp: 18  Temp: 97.7 F (36.5 C)  SpO2: 99%   Filed Weights   12/18/18 0900  Weight: 135 lb 5 oz (61.4 kg)    Physical Exam Constitutional:      Appearance: Normal appearance. She is normal weight.  Cardiovascular:     Rate and Rhythm: Normal rate and regular rhythm.     Heart sounds: Normal heart sounds.  Pulmonary:     Effort: Pulmonary effort is normal.     Breath sounds: Normal breath sounds.  Abdominal:     General: Bowel sounds are normal.     Palpations: Abdomen is soft.  Musculoskeletal:        General: Normal range of motion.  Skin:    General: Skin is warm.  Neurological:     Mental Status: She is alert and oriented to person, place, and time. Mental status is at baseline.  Psychiatric:        Mood and Affect: Mood normal.        Behavior: Behavior normal.        Thought Content: Thought content normal.        Judgment: Judgment normal.      LABORATORY DATA:  I have reviewed the labs as listed.  CBC    Component Value Date/Time   WBC 4.7 12/17/2018 0854   RBC 4.22 12/17/2018 0854   HGB 12.1 12/17/2018 0854   HCT 40.1 12/17/2018 0854   PLT 200 12/17/2018 0854   MCV 95.0 12/17/2018 0854   MCH 28.7 12/17/2018 0854   MCHC 30.2 12/17/2018 0854   RDW 12.9 12/17/2018 0854   LYMPHSABS 1.3 12/17/2018 0854   MONOABS 0.4 12/17/2018 0854   EOSABS 0.1 12/17/2018 0854   BASOSABS 0.0 12/17/2018 0854   CMP Latest Ref Rng & Units 12/17/2018 12/13/2018 10/22/2018  Glucose 70 - 99 mg/dL 75 - 113(H)  BUN 8 - 23 mg/dL 11 - 14  Creatinine 0.44 - 1.00 mg/dL 0.94 0.90 0.94  Sodium 135 - 145 mmol/L 142 - 139  Potassium 3.5 - 5.1 mmol/L 3.6 - 4.1  Chloride 98 - 111 mmol/L 102 - 100  CO2 22 - 32 mmol/L 30 - 29  Calcium 8.9 - 10.3 mg/dL 8.9 - 8.9  Total Protein 6.5 - 8.1 g/dL 7.0 - 6.8  Total Bilirubin 0.3 - 1.2 mg/dL 0.4 - 0.5  Alkaline Phos 38 - 126 U/L 122 - 109  AST 15 - 41 U/L 21 - 18  ALT 0 -  44 U/L 15 - 13    I personally performed a face-to-face visit.  All questions were answered to patient's stated satisfaction. Encouraged patient to call with any new concerns or questions before his next visit to the cancer center and we can certain see him sooner, if needed.     ASSESSMENT & PLAN:   Non Hodgkin's lymphoma ([place]) 1. Acquired hypogammaglobulinemia: -She started off receiving IVIG every 4 weeks.  It was switched to every 6 weeks in July 2019. We now switched her to every 8 weeks.  She has been tolerating this very well. -She takes 5 mg of prednisone daily for her breathing. -She was seen at her primary care doctor the first week in February and received antibiotics for sinus infection.  She also had one trip to the ER 02/23/2018 for IV fluids due to a virus. -She denies any new  infections since her last visit.  She still uses her inhalers and takes prednisone for her breathing. -Last IVIG was given 10/23/2018 - Her blood work was reviewed and labs on 12/17/2018 showed her IgG trough level was 1216. - She will receive IVIG infusion today. -She will continue to get her IVIG infusions every 2 months with labs. -We will see her back in 4 months for follow-up with labs.  2. Diffuse large B-cell lymphoma, stage IVb: -She was diagnosed 07/08/2009.  She was treated with 4 cycles of R-CHOP with intrathecal methotrexate during cycles 3 and 4.she developed pseudomonas sepsis after cycles 2 and 4 and therapy was stopped after 4 cycles.  She also received 2 years of maintenance rituximab. - PET CT scan on 03/20/2017 did not show any evidence of lymphoma. - Today's exam did not reveal any palpable adenopathy or splenomegaly. -Labs on 12/17/2018 showed her LDH 176. - We will do scans of clinical conditions dictates.  3. Left lung nodule: - PET/CT from 03/2017 revealed no evidence of lung nodule.  4. Nodule on spleen: - CT of abdomen pelvis on 02/23/2018 revealed a new 5 mm probably benign  hypodensity within the anterior spleen. - It suggest 6 to 49-month MRI follow-up to document stability. - Denies any abdominal pain.  No splenomegaly on exam. -MRI completed on 12/13/2018 showed no evidence of focal splenic lesion.  Also saw what might represent a small splenic hematoma that is limited visibility due to her respiratory motion.  Suggested follow-up in 6 months.  Also solid sludge and small stones in her gallbladder with no biliary ductal distention.  5. Health maintenance: - She had her mammogram on 11/08/2018 which was BI-RADS 1 negative.      Orders placed this encounter:  Orders Placed This Encounter  Procedures  . Lactate dehydrogenase  . IgG, IgA, IgM  . CBC with Differential/Platelet  . Comprehensive metabolic panel  . Vitamin B12  . Vitamin D 25 hydroxy      Francene Finders, FNP-C [facility] [phone]

## 2018-12-18 NOTE — Patient Instructions (Signed)
[facility] Discharge Instructions  Follow up in 4 months with labs    Thank you for choosing [facility] at [facility] to provide your oncology and hematology care.  To afford each patient quality time with our provider, please arrive at least 15 minutes before your scheduled appointment time.   If you have a lab appointment with the Cancer Center please come in thru the Main Entrance and check in at the main information desk.  You need to re-schedule your appointment should you arrive 10 or more minutes late.  We strive to give you quality time with our providers, and arriving late affects you and other patients whose appointments are after yours.  Also, if you no show three or more times for appointments you may be dismissed from the clinic at the providers discretion.     Again, thank you for choosing [facility].  Our hope is that these requests will decrease the amount of time that you wait before being seen by our physicians.       _____________________________________________________________  Should you have questions after your visit to [facility], please contact our office at [phone] between the hours of 8:00 a.m. and 4:30 p.m.  Voicemails left after 4:00 p.m. will not be returned until the following business day.  For prescription refill requests, have your pharmacy contact our office and allow 72 hours.    Due to Covid, you will need to wear a mask upon entering the hospital. If you do not have a mask, a mask will be given to you at the Main Entrance upon arrival. For doctor visits, patients may have 1 support person with them. For treatment visits, patients can not have anyone with them due to social distancing guidelines and our immunocompromised population.      

## 2018-12-18 NOTE — Progress Notes (Signed)
1030 Labs reviewed with and pt seen by RLockamy NP and pt approved for IVIG infusion today per MD                                         [name] tolerated IVIG infusion well without complaints or incident. Peripheral IV site checked with positive blood return noted prior to and after infusion. VSS upon discharge. Pt discharged self ambulatory in satisfactory condition

## 2018-12-18 NOTE — Patient Instructions (Signed)
[facility] at [facility] Discharge Instructions Received IVIG infusion today. Follow-up as scheduled. Call clinic for any questions or concerns   Thank you for choosing [facility] at [facility] to provide your oncology and hematology care.  To afford each patient quality time with our provider, please arrive at least 15 minutes before your scheduled appointment time.   If you have a lab appointment with the Cancer Center please come in thru the Main Entrance and check in at the main information desk.  You need to re-schedule your appointment should you arrive 10 or more minutes late.  We strive to give you quality time with our providers, and arriving late affects you and other patients whose appointments are after yours.  Also, if you no show three or more times for appointments you may be dismissed from the clinic at the providers discretion.     Again, thank you for choosing [facility].  Our hope is that these requests will decrease the amount of time that you wait before being seen by our physicians.       _____________________________________________________________  Should you have questions after your visit to  Cancer Center, please contact our office at [phone] between the hours of 8:00 a.m. and 4:30 p.m.  Voicemails left after 4:00 p.m. will not be returned until the following business day.  For prescription refill requests, have your pharmacy contact our office and allow 72 hours.    Due to Covid, you will need to wear a mask upon entering the hospital. If you do not have a mask, a mask will be given to you at the Main Entrance upon arrival. For doctor visits, patients may have 1 support person with them. For treatment visits, patients can not have anyone with them due to social distancing guidelines and our immunocompromised population.     

## 2018-12-18 NOTE — Assessment & Plan Note (Addendum)
1. Acquired hypogammaglobulinemia: -She started off receiving IVIG every 4 weeks.  It was switched to every 6 weeks in July 2019. We now switched her to every 8 weeks.  She has been tolerating this very well. -She takes 5 mg of prednisone daily for her breathing. -She was seen at her primary care doctor the first week in February and received antibiotics for sinus infection.  She also had one trip to the ER 02/23/2018 for IV fluids due to a virus. -She denies any new infections since her last visit.  She still uses her inhalers and takes prednisone for her breathing. -Last IVIG was given 10/23/2018 - Her blood work was reviewed and labs on 12/17/2018 showed her IgG trough level was 1216. - She will receive IVIG infusion today. -She will continue to get her IVIG infusions every 2 months with labs. -We will see her back in 4 months for follow-up with labs.  2. Diffuse large B-cell lymphoma, stage IVb: -She was diagnosed 07/08/2009.  She was treated with 4 cycles of R-CHOP with intrathecal methotrexate during cycles 3 and 4.she developed pseudomonas sepsis after cycles 2 and 4 and therapy was stopped after 4 cycles.  She also received 2 years of maintenance rituximab. - PET CT scan on 03/20/2017 did not show any evidence of lymphoma. - Today's exam did not reveal any palpable adenopathy or splenomegaly. -Labs on 12/17/2018 showed her LDH 176. - We will do scans of clinical conditions dictates.  3. Left lung nodule: - PET/CT from 03/2017 revealed no evidence of lung nodule.  4. Nodule on spleen: - CT of abdomen pelvis on 02/23/2018 revealed a new 5 mm probably benign hypodensity within the anterior spleen. - It suggest 6 to 61-month MRI follow-up to document stability. - Denies any abdominal pain.  No splenomegaly on exam. -MRI completed on 12/13/2018 showed no evidence of focal splenic lesion.  Also saw what might represent a small splenic hematoma that is limited visibility due to her respiratory  motion.  Suggested follow-up in 6 months.  Also solid sludge and small stones in her gallbladder with no biliary ductal distention.  5. Health maintenance: - She had her mammogram on 11/08/2018 which was BI-RADS 1 negative.

## 2019-01-18 DIAGNOSIS — F909 Attention-deficit hyperactivity disorder, unspecified type: Secondary | ICD-10-CM | POA: Diagnosis not present

## 2019-02-12 ENCOUNTER — Encounter (HOSPITAL_COMMUNITY): Payer: Self-pay

## 2019-02-12 ENCOUNTER — Inpatient Hospital Stay (HOSPITAL_COMMUNITY): Payer: Medicare Other

## 2019-02-12 ENCOUNTER — Inpatient Hospital Stay (HOSPITAL_COMMUNITY): Payer: Medicare Other | Attending: Hematology

## 2019-02-12 ENCOUNTER — Other Ambulatory Visit: Payer: Self-pay

## 2019-02-12 VITALS — BP 125/60 | HR 72 | Temp 97.7°F | Resp 18 | Wt 135.0 lb

## 2019-02-12 DIAGNOSIS — D801 Nonfamilial hypogammaglobulinemia: Secondary | ICD-10-CM

## 2019-02-12 LAB — COMPREHENSIVE METABOLIC PANEL
ALT: 13 U/L (ref 0–44)
AST: 15 U/L (ref 15–41)
Albumin: 3.6 g/dL (ref 3.5–5.0)
Alkaline Phosphatase: 106 U/L (ref 38–126)
Anion gap: 9 (ref 5–15)
BUN: 19 mg/dL (ref 8–23)
CO2: 30 mmol/L (ref 22–32)
Calcium: 8.7 mg/dL — ABNORMAL LOW (ref 8.9–10.3)
Chloride: 100 mmol/L (ref 98–111)
Creatinine, Ser: 0.97 mg/dL (ref 0.44–1.00)
GFR calc Af Amer: >60 mL/min (ref >60)
GFR calc non Af Amer: >60 mL/min (ref >60)
Glucose, Bld: 83 mg/dL (ref 70–99)
Potassium: 4.2 mmol/L (ref 3.5–5.1)
Sodium: 139 mmol/L (ref 135–145)
Total Bilirubin: 0.6 mg/dL (ref 0.3–1.2)
Total Protein: 6.7 g/dL (ref 6.5–8.1)

## 2019-02-12 LAB — CBC WITH DIFFERENTIAL/PLATELET
Abs Immature Granulocytes: 0.02 10*3/uL (ref 0.00–0.07)
Basophils Absolute: 0 10*3/uL (ref 0.0–0.1)
Basophils Relative: 1 %
Eosinophils Absolute: 0.1 10*3/uL (ref 0.0–0.5)
Eosinophils Relative: 2 %
HCT: 39 % (ref 36.0–46.0)
Hemoglobin: 11.9 g/dL — ABNORMAL LOW (ref 12.0–15.0)
Immature Granulocytes: 0 %
Lymphocytes Relative: 22 %
Lymphs Abs: 1.6 10*3/uL (ref 0.7–4.0)
MCH: 28.7 pg (ref 26.0–34.0)
MCHC: 30.5 g/dL (ref 30.0–36.0)
MCV: 94.2 fL (ref 80.0–100.0)
Monocytes Absolute: 0.6 10*3/uL (ref 0.1–1.0)
Monocytes Relative: 9 %
Neutro Abs: 4.8 10*3/uL (ref 1.7–7.7)
Neutrophils Relative %: 66 %
Platelets: 202 10*3/uL (ref 150–400)
RBC: 4.14 MIL/uL (ref 3.87–5.11)
RDW: 13.6 % (ref 11.5–15.5)
WBC: 7.2 10*3/uL (ref 4.0–10.5)
nRBC: 0 % (ref 0.0–0.2)

## 2019-02-12 LAB — LACTATE DEHYDROGENASE: LDH: 152 U/L (ref 98–192)

## 2019-02-12 MED ORDER — IMMUNE GLOBULIN (HUMAN) 10 GM/100ML IV SOLN
30.0000 g | Freq: Once | INTRAVENOUS | Status: AC
  Administered 2019-02-12: 30 g via INTRAVENOUS
  Filled 2019-02-12: qty 200

## 2019-02-12 MED ORDER — ACETAMINOPHEN 325 MG PO TABS
650.0000 mg | ORAL_TABLET | Freq: Four times a day (QID) | ORAL | Status: DC | PRN
  Filled 2019-02-12: qty 2

## 2019-02-12 MED ORDER — DIPHENHYDRAMINE HCL 25 MG PO CAPS
25.0000 mg | ORAL_CAPSULE | Freq: Once | ORAL | Status: DC
  Filled 2019-02-12: qty 1

## 2019-02-12 MED ORDER — DEXTROSE 5 % IV SOLN: INTRAVENOUS | Status: DC

## 2019-02-12 NOTE — Progress Notes (Signed)
Patient tolerated IVIG with no complaints voiced.  Peripheral IV site clean and dry with no bruising or swelling noted.  Band aid applied.  VSS with discharge and left ambulatory with no s/s of distress noted.

## 2019-02-13 LAB — IGG, IGA, IGM
IgA: <5 mg/dL — ABNORMAL LOW (ref 87–352)
IgG (Immunoglobin G), Serum: 1122 mg/dL (ref 586–1602)
IgM (Immunoglobulin M), Srm: 106 mg/dL (ref 26–217)

## 2019-02-20 ENCOUNTER — Other Ambulatory Visit (HOSPITAL_COMMUNITY): Payer: Self-pay | Admitting: *Deleted

## 2019-02-20 DIAGNOSIS — Z85828 Personal history of other malignant neoplasm of skin: Secondary | ICD-10-CM | POA: Diagnosis not present

## 2019-02-20 DIAGNOSIS — D801 Nonfamilial hypogammaglobulinemia: Secondary | ICD-10-CM

## 2019-02-20 DIAGNOSIS — L821 Other seborrheic keratosis: Secondary | ICD-10-CM | POA: Diagnosis not present

## 2019-02-20 DIAGNOSIS — L570 Actinic keratosis: Secondary | ICD-10-CM | POA: Diagnosis not present

## 2019-02-20 MED ORDER — ACYCLOVIR 400 MG PO TABS: 400.0000 mg | ORAL_TABLET | Freq: Two times a day (BID) | ORAL | 3 refills | Status: DC

## 2019-03-08 DIAGNOSIS — S2231XA Fracture of one rib, right side, initial encounter for closed fracture: Secondary | ICD-10-CM | POA: Diagnosis not present

## 2019-03-08 DIAGNOSIS — Z6824 Body mass index (BMI) 24.0-24.9, adult: Secondary | ICD-10-CM | POA: Diagnosis not present

## 2019-03-08 DIAGNOSIS — N390 Urinary tract infection, site not specified: Secondary | ICD-10-CM | POA: Diagnosis not present

## 2019-03-08 DIAGNOSIS — E119 Type 2 diabetes mellitus without complications: Secondary | ICD-10-CM | POA: Diagnosis not present

## 2019-03-16 ENCOUNTER — Other Ambulatory Visit: Payer: Self-pay | Admitting: Emergency Medicine

## 2019-03-19 ENCOUNTER — Other Ambulatory Visit: Payer: Self-pay | Admitting: Adult Health

## 2019-04-01 ENCOUNTER — Other Ambulatory Visit (HOSPITAL_COMMUNITY): Payer: Self-pay | Admitting: *Deleted

## 2019-04-01 DIAGNOSIS — D801 Nonfamilial hypogammaglobulinemia: Secondary | ICD-10-CM

## 2019-04-01 DIAGNOSIS — C8338 Diffuse large B-cell lymphoma, lymph nodes of multiple sites: Secondary | ICD-10-CM

## 2019-04-04 ENCOUNTER — Inpatient Hospital Stay (HOSPITAL_COMMUNITY): Payer: Medicare Other | Attending: Hematology

## 2019-04-04 ENCOUNTER — Other Ambulatory Visit: Payer: Self-pay

## 2019-04-04 DIAGNOSIS — J45909 Unspecified asthma, uncomplicated: Secondary | ICD-10-CM | POA: Diagnosis not present

## 2019-04-04 DIAGNOSIS — D801 Nonfamilial hypogammaglobulinemia: Secondary | ICD-10-CM | POA: Diagnosis present

## 2019-04-04 DIAGNOSIS — Z8572 Personal history of non-Hodgkin lymphomas: Secondary | ICD-10-CM | POA: Insufficient documentation

## 2019-04-04 DIAGNOSIS — C8338 Diffuse large B-cell lymphoma, lymph nodes of multiple sites: Secondary | ICD-10-CM

## 2019-04-04 LAB — CBC WITH DIFFERENTIAL/PLATELET
Abs Immature Granulocytes: 0.02 10*3/uL (ref 0.00–0.07)
Basophils Absolute: 0.1 10*3/uL (ref 0.0–0.1)
Basophils Relative: 1 %
Eosinophils Absolute: 0.3 10*3/uL (ref 0.0–0.5)
Eosinophils Relative: 4 %
HCT: 39.6 % (ref 36.0–46.0)
Hemoglobin: 12 g/dL (ref 12.0–15.0)
Immature Granulocytes: 0 %
Lymphocytes Relative: 27 %
Lymphs Abs: 1.7 10*3/uL (ref 0.7–4.0)
MCH: 28.7 pg (ref 26.0–34.0)
MCHC: 30.3 g/dL (ref 30.0–36.0)
MCV: 94.7 fL (ref 80.0–100.0)
Monocytes Absolute: 0.6 10*3/uL (ref 0.1–1.0)
Monocytes Relative: 9 %
Neutro Abs: 3.8 10*3/uL (ref 1.7–7.7)
Neutrophils Relative %: 59 %
Platelets: 175 10*3/uL (ref 150–400)
RBC: 4.18 MIL/uL (ref 3.87–5.11)
RDW: 13.8 % (ref 11.5–15.5)
WBC: 6.4 10*3/uL (ref 4.0–10.5)
nRBC: 0 % (ref 0.0–0.2)

## 2019-04-04 LAB — COMPREHENSIVE METABOLIC PANEL
ALT: 13 U/L (ref 0–44)
AST: 18 U/L (ref 15–41)
Albumin: 3.5 g/dL (ref 3.5–5.0)
Alkaline Phosphatase: 124 U/L (ref 38–126)
Anion gap: 9 (ref 5–15)
BUN: 14 mg/dL (ref 8–23)
CO2: 30 mmol/L (ref 22–32)
Calcium: 8.9 mg/dL (ref 8.9–10.3)
Chloride: 101 mmol/L (ref 98–111)
Creatinine, Ser: 0.99 mg/dL (ref 0.44–1.00)
GFR calc Af Amer: >60 mL/min (ref >60)
GFR calc non Af Amer: >60 mL/min (ref >60)
Glucose, Bld: 105 mg/dL — ABNORMAL HIGH (ref 70–99)
Potassium: 3.7 mmol/L (ref 3.5–5.1)
Sodium: 140 mmol/L (ref 135–145)
Total Bilirubin: 0.5 mg/dL (ref 0.3–1.2)
Total Protein: 6.9 g/dL (ref 6.5–8.1)

## 2019-04-04 LAB — VITAMIN B12: Vitamin B-12: 874 pg/mL (ref 180–914)

## 2019-04-04 LAB — LACTATE DEHYDROGENASE: LDH: 148 U/L (ref 98–192)

## 2019-04-04 LAB — VITAMIN D 25 HYDROXY (VIT D DEFICIENCY, FRACTURES): Vit D, 25-Hydroxy: 35.89 ng/mL (ref 30–100)

## 2019-04-05 LAB — IGG, IGA, IGM
IgA: <5 mg/dL — ABNORMAL LOW (ref 87–352)
IgG (Immunoglobin G), Serum: 1097 mg/dL (ref 586–1602)
IgM (Immunoglobulin M), Srm: 82 mg/dL (ref 26–217)

## 2019-04-09 ENCOUNTER — Inpatient Hospital Stay (HOSPITAL_BASED_OUTPATIENT_CLINIC_OR_DEPARTMENT_OTHER): Payer: Medicare Other | Admitting: Hematology

## 2019-04-09 ENCOUNTER — Other Ambulatory Visit: Payer: Self-pay

## 2019-04-09 ENCOUNTER — Inpatient Hospital Stay (HOSPITAL_COMMUNITY): Payer: Medicare Other

## 2019-04-09 VITALS — BP 130/64 | HR 67 | Temp 97.1°F | Resp 16 | Wt 139.2 lb

## 2019-04-09 VITALS — BP 124/65 | HR 78 | Temp 97.5°F | Resp 18

## 2019-04-09 DIAGNOSIS — C8338 Diffuse large B-cell lymphoma, lymph nodes of multiple sites: Secondary | ICD-10-CM

## 2019-04-09 DIAGNOSIS — D801 Nonfamilial hypogammaglobulinemia: Secondary | ICD-10-CM

## 2019-04-09 DIAGNOSIS — J45909 Unspecified asthma, uncomplicated: Secondary | ICD-10-CM | POA: Diagnosis not present

## 2019-04-09 DIAGNOSIS — Z8572 Personal history of non-Hodgkin lymphomas: Secondary | ICD-10-CM | POA: Diagnosis not present

## 2019-04-09 MED ORDER — DEXTROSE 5 % IV SOLN: INTRAVENOUS | Status: DC

## 2019-04-09 MED ORDER — DIPHENHYDRAMINE HCL 25 MG PO CAPS: 25.0000 mg | ORAL_CAPSULE | Freq: Once | ORAL | Status: DC

## 2019-04-09 MED ORDER — ACETAMINOPHEN 325 MG PO TABS: 650.0000 mg | ORAL_TABLET | Freq: Four times a day (QID) | ORAL | Status: DC | PRN

## 2019-04-09 MED ORDER — IMMUNE GLOBULIN (HUMAN) 10 GM/100ML IV SOLN
30.0000 g | Freq: Once | INTRAVENOUS | Status: AC
  Administered 2019-04-09: 09:00:00 30 g via INTRAVENOUS
  Filled 2019-04-09: qty 100

## 2019-04-09 NOTE — Progress Notes (Signed)
[facility], [place] [address]   CLINIC:  Medical Oncology/Hematology  PCP:  Sharilyn Sites, [place] [id number] [phone]   REASON FOR VISIT:  Follow-up for acquired hypogammaglobulinemia and DLBCL.  CURRENT THERAPY: IVIG every 8 weeks.  BRIEF ONCOLOGIC HISTORY:  Oncology History  Non Hodgkin's lymphoma ([place])  09/09/2009 Initial Diagnosis   Non Hodgkin's lymphoma ([place])      CANCER STAGING: Cancer Staging Non Hodgkin's lymphoma ([place]) Staging form: Lymphoid Neoplasms, AJCC 6th Edition - Clinical: Stage IV - Signed by Baird Cancer, PA on 11/05/2010    INTERVAL HISTORY:  Ms. Fallin 64 y.o. female seen for follow-up of hypogammaglobulinemia and large B-cell lymphoma.  Denies any fevers, night sweats or weight loss in the last 6 months.  Had 1 sinus infection requiring antibiotics 3 to 4 months ago.  She is continuing prednisone for her asthma.  Denies any ER visits or hospitalizations.  No new onset pains reported.    REVIEW OF SYSTEMS:  Review of Systems  All other systems reviewed and are negative.    PAST MEDICAL/SURGICAL HISTORY:  Past Medical History:  Diagnosis Date  . Allergic rhinitis   . Allergy   . Anemia   . Anxiety   . Asthma   . B12 deficiency 01/18/2015  . Blood transfusion without reported diagnosis   . Cataract   . Cavitary lung disease   . Chronic kidney disease    related to cancer  . Chronic sinusitis   . Clotting disorder ([place])   . COPD (chronic obstructive pulmonary disease) ([place])   . Depression   . DVT (deep venous thrombosis) ([place]) 09/25/2014  . Endotracheally intubated   . GERD (gastroesophageal reflux disease)   . Hematuria 04/07/2016  . Hyperlipidemia   . Hypertension   . Hypogammaglobulinemia, acquired ([place]) 12/30/2009   Qualifier: Diagnosis of  By: Joya Gaskins MD, Burnett Harry   . Iron deficiency anemia 01/18/2015  . Myocardial infarction ([place]) 2011  . Narcolepsy  without cataplexy(347.00) 01/18/2015  . Non Hodgkin's lymphoma ([place]) 2011  . Orofacial dyskinesia 04/22/2015  . Orofacial dyskinesia 04/22/2015  . Osteoporosis   . Peripheral neuropathy due to chemotherapy ([place]) 05/02/2016  . Pneumonia 01/2013  . PONV (postoperative nausea and vomiting)   . Respiratory failure ([place])   . Sleep apnea   . SVC syndrome 09/25/2014   Past Surgical History:  Procedure Laterality Date  . ABDOMINAL HYSTERECTOMY     Fibroids  . BASAL CELL CARCINOMA EXCISION  03/2011   scalp  . BREAST SURGERY Right    biopsy  . CATARACT EXTRACTION W/PHACO Left 11/17/2014   Procedure: CATARACT EXTRACTION PHACO AND INTRAOCULAR LENS PLACEMENT LEFT EYE;  Surgeon: Tonny Branch, MD;  Location: [facility];  Service: Ophthalmology;  Laterality: Left;  CDE:5.60  . CATARACT EXTRACTION W/PHACO Right 12/15/2014   Procedure: CATARACT EXTRACTION PHACO AND INTRAOCULAR LENS PLACEMENT RIGHT EYE CDE=5.16;  Surgeon: Tonny Branch, MD;  Location: [facility];  Service: Ophthalmology;  Laterality: Right;  . COLONOSCOPY N/A 11/14/2012   Procedure: COLONOSCOPY;  Surgeon: Rogene Houston, MD;  Location: [facility] SUITE;  Service: Endoscopy;  Laterality: N/A;  830  . COLONOSCOPY WITH PROPOFOL N/A 01/12/2018   Procedure: COLONOSCOPY WITH PROPOFOL;  Surgeon: Rogene Houston, MD;  Location: [facility];  Service: Endoscopy;  Laterality: N/A;  7:30  . EYE SURGERY    . NASAL SINUS SURGERY    . NECK SURGERY     x  2   . PERIPHERALLY INSERTED CENTRAL CATHETER INSERTION    . PICC Removal    . POLYPECTOMY  01/12/2018   Procedure: POLYPECTOMY;  Surgeon: Rogene Houston, MD;  Location: [facility];  Service: Endoscopy;;  colon  . PORT-A-CATH REMOVAL    . PORTACATH PLACEMENT  7/11   Removed 6/12  . TRACHEOSTOMY    . VESICOVAGINAL FISTULA CLOSURE W/ TAH       SOCIAL HISTORY:  Social History   Socioeconomic History  . Marital status: Divorced    Spouse name: Not on file  . Number of children: 3  . Years of education:  16  . Highest education level: Not on file  Occupational History  . Occupation: Estate agent    Comment: [employer]  . Smoking status: Never Smoker  . Smokeless tobacco: Never Used  Substance and Sexual Activity  . Alcohol use: No  . Drug use: No  . Sexual activity: Not Currently    Birth control/protection: Surgical    Comment: hyst  Other Topics Concern  . Not on file  Social History Narrative   Originally from [place]. Always lived in [place]. Prior travel to [place]. 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 She has 1 dog currently. She has 1 conures (small parrots). No mold exposure in her home. 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 No hot tub exposure. She enjoys sewing & quilting.    Social Determinants of Health   Financial Resource Strain:   . Difficulty of Paying Living Expenses:   Food Insecurity:   . Worried About Charity fundraiser in the Last Year:   . Arboriculturist in the Last Year:   Transportation Needs:   . Film/video editor (Medical):   [name] Kitchen Lack of Transportation (Non-Medical):   Physical Activity:   . Days of Exercise per Week:   . Minutes of Exercise per Session:   Stress:   . Feeling of Stress :   Social Connections:   . Frequency of Communication with Friends and Family:   . Frequency of Social Gatherings with Friends and Family:   . Attends Religious Services:   . Active Member of Clubs or Organizations:   . Attends Archivist Meetings:   [name] Kitchen Marital Status:   Intimate Partner Violence:   . Fear of Current or Ex-Partner:   . Emotionally Abused:   [name] Kitchen Physically Abused:   . Sexually Abused:     FAMILY HISTORY:  Family History  Problem Relation Age of Onset  . Emphysema Mother   . Stroke Mother   . COPD Mother   . Heart disease Mother        died in sleep  4  . Allergies Father   . Asthma Father        as a child  . Arthritis Father   . Parkinson's  disease Father 26  . Leukemia Maternal Grandmother   . Cancer Maternal Grandmother        Leukemia  . Diabetes Brother   . Heart attack Brother   . Hypertension Brother   . Heart disease Brother 40       stents  . Hypertension Sister   . Hypertension Brother     CURRENT MEDICATIONS:  Outpatient Encounter Medications as of 04/09/2019  Medication Sig  . acetaminophen (TYLENOL) 500 MG tablet Take 1,000 mg by  mouth every 6 (six) hours as needed for moderate pain or headache.   [name] Kitchen acyclovir (ZOVIRAX) 400 MG tablet Take 1 tablet (400 mg total) by mouth 2 (two) times daily.  [name] Kitchen albuterol (PROAIR HFA) 108 (90 Base) MCG/ACT inhaler Inhale 2 puffs into the lungs every 6 (six) hours as needed. (Patient taking differently: Inhale 2 puffs into the lungs every 6 (six) hours as needed for wheezing or shortness of breath. )  . albuterol (PROVENTIL) (2.5 MG/3ML) 0.083% nebulizer solution Take 2.5 mg by nebulization every 4 (four) hours as needed for wheezing or shortness of breath.  . amphetamine-dextroamphetamine (ADDERALL) 10 MG tablet Take 10 mg by mouth every morning.   . Armodafinil (NUVIGIL) 150 MG tablet Take 150 mg by mouth every morning.   . budesonide-formoterol (SYMBICORT) 160-4.5 MCG/ACT inhaler Inhale 2 puffs into the lungs 2 (two) times daily.  . Cholecalciferol (VITAMIN D3 PO) Take 1 capsule by mouth at bedtime.  . citalopram (CELEXA) 20 MG tablet Take 20 mg by mouth every morning.    . cyanocobalamin 1000 MCG tablet Take 1,000 mcg by mouth daily.  . furosemide (LASIX) 20 MG tablet Take 1 tablet (20 mg total) by mouth as needed for fluid.  [name] Kitchen gabapentin (NEURONTIN) 300 MG capsule Take 300 mg by mouth 2 (two) times daily.   [name] Kitchen gabapentin (NEURONTIN) 600 MG tablet   . mupirocin ointment (BACTROBAN) 2 %   . olmesartan (BENICAR) 40 MG tablet Take 20-40 mg by mouth daily.  [name] Kitchen omeprazole (PRILOSEC) 20 MG capsule Take 1 capsule (20 mg total) by mouth 2 (two) times daily.  . predniSONE (DELTASONE) 5  MG tablet TAKE 1 TABLET BY MOUTH ONCE A DAY.  . simvastatin (ZOCOR) 80 MG tablet Take 1 tablet (80 mg total) by mouth at bedtime.  . zafirlukast (ACCOLATE) 20 MG tablet TAKE 1 TABLET BY MOUTH TWICE DAILY.   Facility-Administered Encounter Medications as of 04/09/2019  Medication  . 0.9 %  sodium chloride infusion  . 0.9 %  sodium chloride infusion  . acetaminophen (TYLENOL) tablet 650 mg  . acetaminophen (TYLENOL) tablet 650 mg  . dextrose 5 % solution  . dextrose 5 % solution  . dextrose 5 % solution  . diphenhydrAMINE (BENADRYL) capsule 25 mg  . diphenhydrAMINE (BENADRYL) capsule 25 mg  . sodium chloride 0.9 % injection 10 mL    ALLERGIES:  Allergies  Allergen Reactions  . Meperidine Hcl Anaphylaxis  . Montelukast Sodium Hives and Rash     PHYSICAL EXAM:  ECOG Performance status: 1  Vitals:   04/09/19 0800  BP: 130/64  Pulse: 67  Resp: 16  Temp: (!) 97.1 F (36.2 C)  SpO2: 98%   Filed Weights   04/09/19 0800  Weight: 139 lb 4 oz (63.2 kg)    Physical Exam Vitals reviewed.  Constitutional:      Appearance: Normal appearance.  Cardiovascular:     Rate and Rhythm: Normal rate and regular rhythm.     Pulses: Normal pulses.     Heart sounds: Normal heart sounds.  Pulmonary:     Breath sounds: Normal breath sounds.  Abdominal:     General: There is no distension.     Palpations: Abdomen is soft. There is no mass.  Lymphadenopathy:     Cervical: No cervical adenopathy.  Skin:    General: Skin is warm.  Neurological:     General: No focal deficit present.     Mental Status: She is alert and oriented to person, place,  and time.  Psychiatric:        Mood and Affect: Mood normal.        Behavior: Behavior normal.   No axillary or inguinal adenopathy.   LABORATORY DATA:  I have reviewed the labs as listed.  CBC    Component Value Date/Time   WBC 6.4 04/04/2019 1031   RBC 4.18 04/04/2019 1031   HGB 12.0 04/04/2019 1031   HCT 39.6 04/04/2019 1031   PLT  175 04/04/2019 1031   MCV 94.7 04/04/2019 1031   MCH 28.7 04/04/2019 1031   MCHC 30.3 04/04/2019 1031   RDW 13.8 04/04/2019 1031   LYMPHSABS 1.7 04/04/2019 1031   MONOABS 0.6 04/04/2019 1031   EOSABS 0.3 04/04/2019 1031   BASOSABS 0.1 04/04/2019 1031   CMP Latest Ref Rng & Units 04/04/2019 02/12/2019 12/17/2018  Glucose 70 - 99 mg/dL 105(H) 83 75  BUN 8 - 23 mg/dL 14 19 11   Creatinine 0.44 - 1.00 mg/dL 0.99 0.97 0.94  Sodium 135 - 145 mmol/L 140 139 142  Potassium 3.5 - 5.1 mmol/L 3.7 4.2 3.6  Chloride 98 - 111 mmol/L 101 100 102  CO2 22 - 32 mmol/L 30 30 30   Calcium 8.9 - 10.3 mg/dL 8.9 8.7(L) 8.9  Total Protein 6.5 - 8.1 g/dL 6.9 6.7 7.0  Total Bilirubin 0.3 - 1.2 mg/dL 0.5 0.6 0.4  Alkaline Phos 38 - 126 U/L 124 106 122  AST 15 - 41 U/L 18 15 21   ALT 0 - 44 U/L 13 13 15        DIAGNOSTIC IMAGING:  I have independently reviewed the scans and discussed with the patient.    ASSESSMENT & PLAN:   Non Hodgkin's lymphoma ([place]) 1. Acquired hypogammaglobulinemia: -She has been receiving IVIG every 8 weeks since January 2020. -We reviewed quantitative immunoglobulins from 04/04/2019.  IgG trough level is 1097.  She does have severely low IgA levels. -She had 1 episode of sinus infection requiring antibiotics about 3 to 4 months ago.  Denies any other pneumonias or skin infections. -She is continuing prednisone 5 mg for her asthma. -She will proceed with IVIG every 8 weeks.  I will see her back in 4 months for follow-up.  2.  DLBCL, stage IVb: -She was diagnosed 07/08/2009.  Treated with 4 cycles of R-CHOP with intrathecal methotrexate during cycle 2 and 4.  She developed a Pseudomonas sepsis after cycles 2 and 4 and therapy was stopped after 4 cycles.  She also received 2 years of maintenance rituximab. -Last PET scan on 03/20/2017 did not show any evidence of lymphoma. -Labs from 04/04/2019 shows LDH of 148.  Physical examination today did not reveal any palpable adenopathy.  She does  not have any fevers, night sweats or weight loss. -We will consider scans if clinical condition dictates.  3.  Health maintenance: -Mammogram on 11/08/2018, BI-RADS Category 1.      Orders placed this encounter:  Orders Placed This Encounter  Procedures  . CBC with Differential  . Comprehensive metabolic panel  . Lactate dehydrogenase  . IgG, IgA, IgM  . Vitamin B12  . Vitamin D 25 hydroxy      Derek Jack, MD [place] [phone]

## 2019-04-09 NOTE — Assessment & Plan Note (Signed)
1. Acquired hypogammaglobulinemia: -She has been receiving IVIG every 8 weeks since January 2020. -We reviewed quantitative immunoglobulins from 04/04/2019.  IgG trough level is 1097.  She does have severely low IgA levels. -She had 1 episode of sinus infection requiring antibiotics about 3 to 4 months ago.  Denies any other pneumonias or skin infections. -She is continuing prednisone 5 mg for her asthma. -She will proceed with IVIG every 8 weeks.  I will see her back in 4 months for follow-up.  2.  DLBCL, stage IVb: -She was diagnosed 07/08/2009.  Treated with 4 cycles of R-CHOP with intrathecal methotrexate during cycle 2 and 4.  She developed a Pseudomonas sepsis after cycles 2 and 4 and therapy was stopped after 4 cycles.  She also received 2 years of maintenance rituximab. -Last PET scan on 03/20/2017 did not show any evidence of lymphoma. -Labs from 04/04/2019 shows LDH of 148.  Physical examination today did not reveal any palpable adenopathy.  She does not have any fevers, night sweats or weight loss. -We will consider scans if clinical condition dictates.  3.  Health maintenance: -Mammogram on 11/08/2018, BI-RADS Category 1.

## 2019-04-09 NOTE — Patient Instructions (Addendum)
[place] at [facility] Discharge Instructions  You were seen today by Dr. Delton Coombes. He went over your recent lab results. You will continue treatment every 2 months.  He will see you back in 4 months for labs and follow up.   Thank you for choosing [place] at [facility] to provide your oncology and hematology care.  To afford each patient quality time with our provider, please arrive at least 15 minutes before your scheduled appointment time.   If you have a lab appointment with the [facility] please come in thru the  Main Entrance and check in at the main information desk  You need to re-schedule your appointment should you arrive 10 or more minutes late.  We strive to give you quality time with our providers, and arriving late affects you and other patients whose appointments are after yours.  Also, if you no show three or more times for appointments you may be dismissed from the clinic at the providers discretion.     Again, thank you for choosing [facility].  Our hope is that these requests will decrease the amount of time that you wait before being seen by our physicians.       _____________________________________________________________  Should you have questions after your visit to [facility], please contact our office at [phone] between the hours of 8:00 a.m. and 4:30 p.m.  Voicemails left after 4:00 p.m. will not be returned until the following business day.  For prescription refill requests, have your pharmacy contact our office and allow 72 hours.    Cancer Center Support Programs:   > Cancer Support Group  2nd Tuesday of the month 1pm-2pm, Journey Room

## 2019-04-09 NOTE — Progress Notes (Signed)
Labs reviewed with MD today at office visit. Proceed with IVIG today as planned.   Treatment given per orders. Patient tolerated it well without problems. Vitals stable and discharged home from clinic ambulatory. Follow up as scheduled.

## 2019-04-09 NOTE — Patient Instructions (Signed)
[facility] Discharge Instructions for Patients Receiving Chemotherapy  Today you received the following chemotherapy agents   To help prevent nausea and vomiting after your treatment, we encourage you to take your nausea medication   If you develop nausea and vomiting that is not controlled by your nausea medication, call the clinic.   BELOW ARE SYMPTOMS THAT SHOULD BE REPORTED IMMEDIATELY:  *FEVER GREATER THAN 100.5 F  *CHILLS WITH OR WITHOUT FEVER  NAUSEA AND VOMITING THAT IS NOT CONTROLLED WITH YOUR NAUSEA MEDICATION  *UNUSUAL SHORTNESS OF BREATH  *UNUSUAL BRUISING OR BLEEDING  TENDERNESS IN MOUTH AND THROAT WITH OR WITHOUT PRESENCE OF ULCERS  *URINARY PROBLEMS  *BOWEL PROBLEMS  UNUSUAL RASH Items with * indicate a potential emergency and should be followed up as soon as possible.  Feel free to call the clinic should you have any questions or concerns. The clinic phone number is [phone].  Please show the CHEMO ALERT CARD at check-in to the Emergency Department and triage nurse.   

## 2019-04-09 NOTE — Progress Notes (Signed)
Patient has been assessed, vital signs and labs have been reviewed by Dr. Katragadda. ANC, Creatinine, LFTs, and Platelets are within treatment parameters per Dr. Katragadda. The patient is good to proceed with treatment at this time.  

## 2019-05-01 IMAGING — MG DIGITAL SCREENING BILATERAL MAMMOGRAM WITH TOMO AND CAD
8 series · 9 of 24 positions shown · non-contrast
Comparison: Previous exam(s).

CLINICAL DATA: Screening.

EXAM:
DIGITAL SCREENING BILATERAL MAMMOGRAM WITH TOMO AND CAD

[R MLO synth-2D]
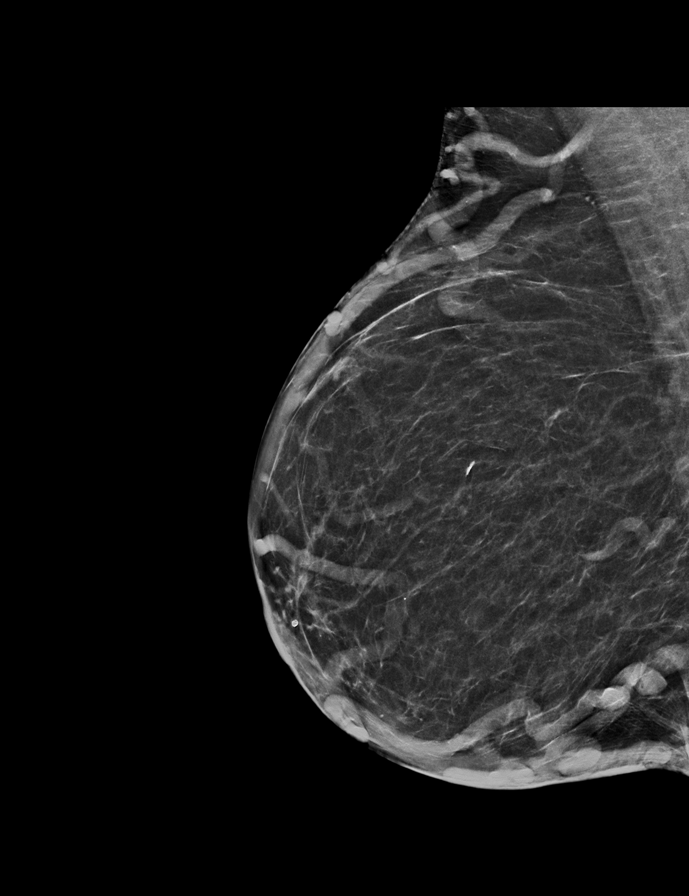

[L CC synth-2D]
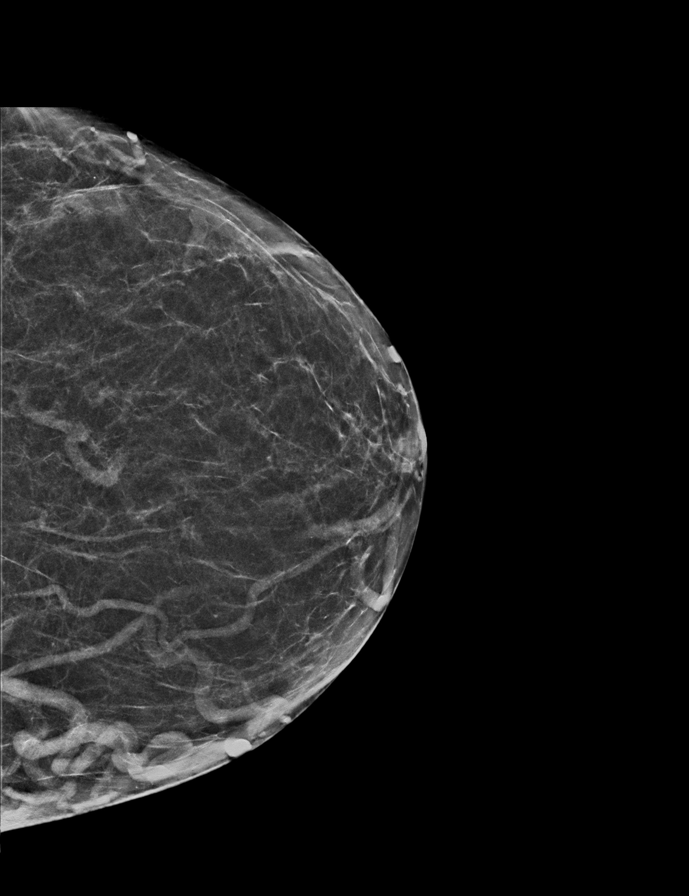

[R CC synth-2D]
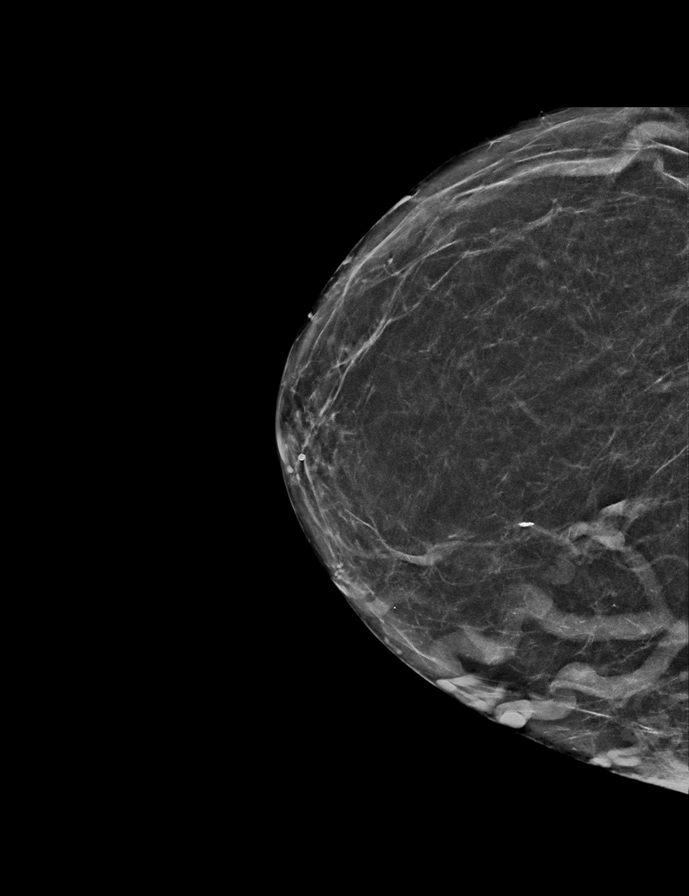

[L MLO synth-2D]
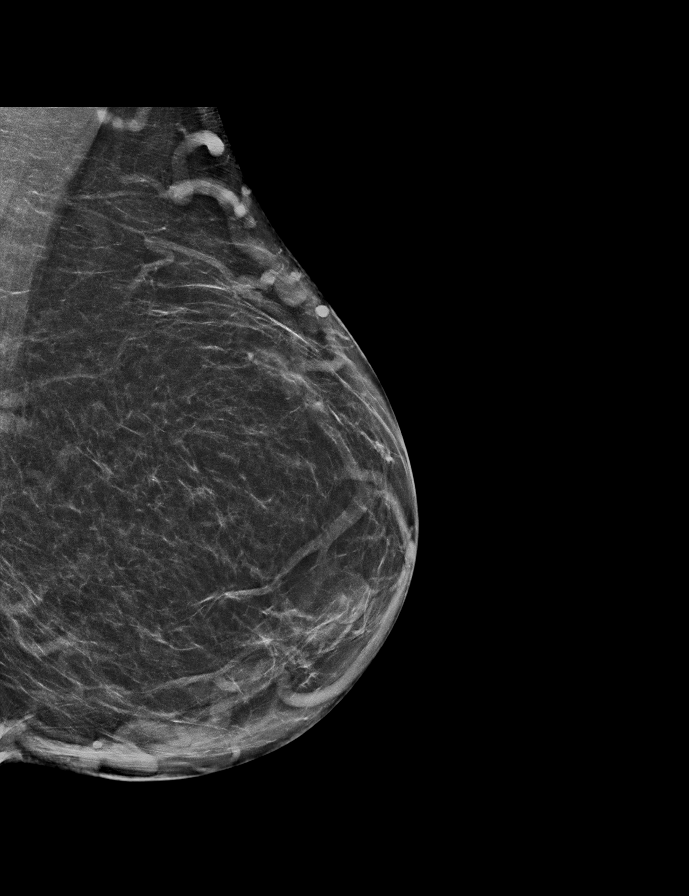

[R CC tomo · 2 of 54 frames shown]
[frame 18/54]
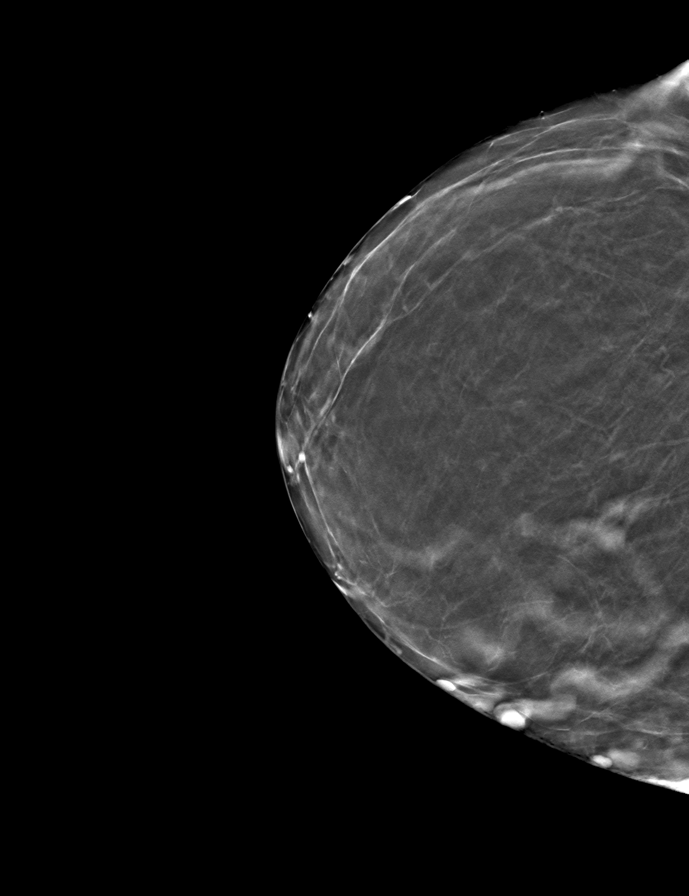
[frame 27/54]
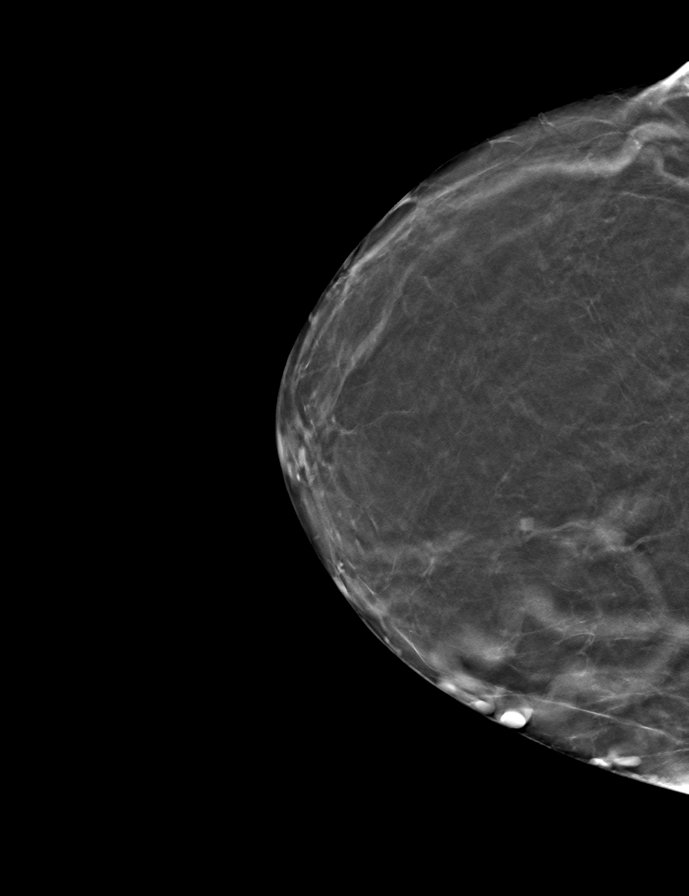

[L MLO tomo · tomo slice 31/61.0]
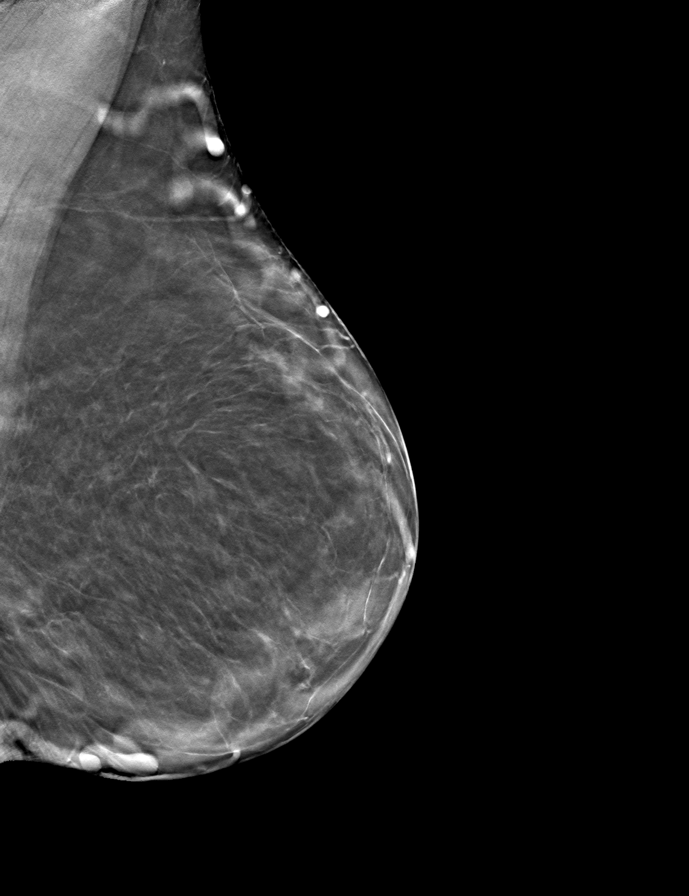

[L CC tomo · tomo slice 27/54.0]
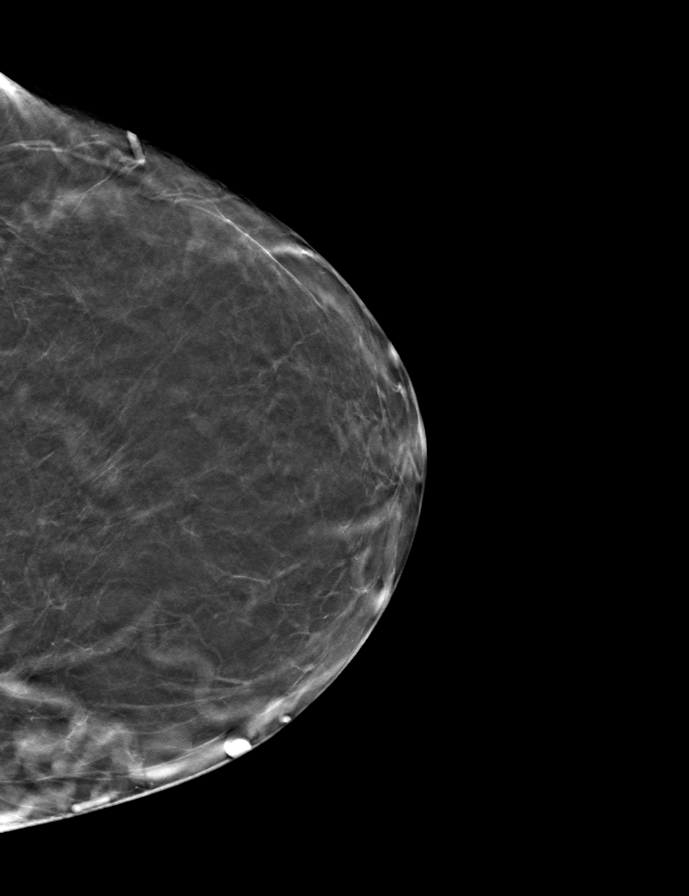

[R MLO tomo · tomo slice 31/60.0]
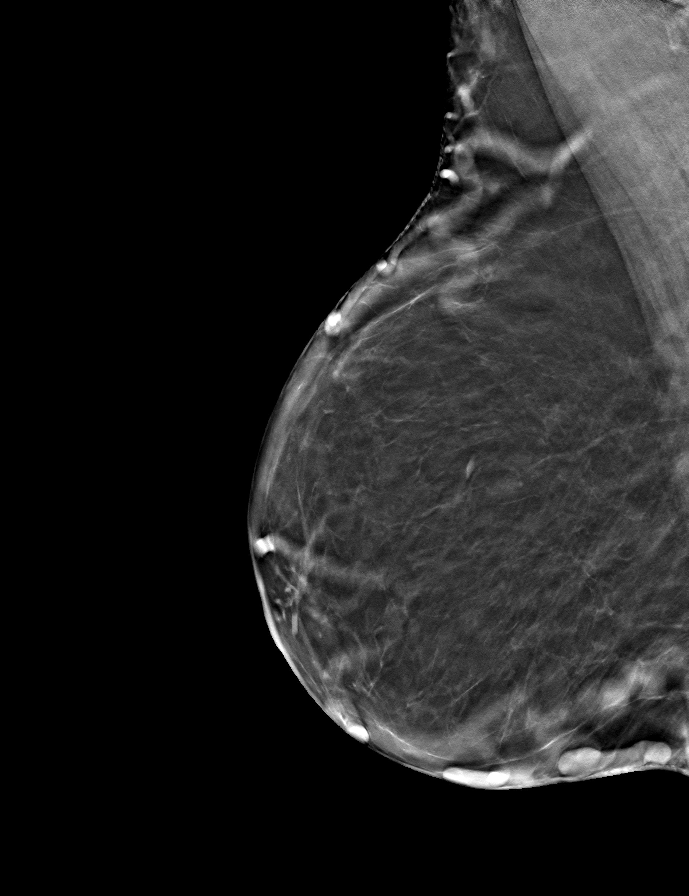

[9 of 24 positions shown; findings below may reference images not displayed]

ACR Breast Density Category b: There are scattered areas of
fibroglandular density.
FINDINGS: There are no findings suspicious for malignancy. Images were
processed with CAD.
IMPRESSION: No mammographic evidence of malignancy. A result letter of this
screening mammogram will be mailed directly to the patient.

RECOMMENDATION:
Screening mammogram in one year. (Code:[id number])

BI-RADS CATEGORY  1: Negative.

## 2019-05-28 DIAGNOSIS — H5203 Hypermetropia, bilateral: Secondary | ICD-10-CM | POA: Diagnosis not present

## 2019-05-30 ENCOUNTER — Inpatient Hospital Stay (HOSPITAL_COMMUNITY): Payer: Medicare Other | Attending: Hematology

## 2019-05-30 ENCOUNTER — Other Ambulatory Visit: Payer: Self-pay

## 2019-05-30 DIAGNOSIS — C8338 Diffuse large B-cell lymphoma, lymph nodes of multiple sites: Secondary | ICD-10-CM | POA: Insufficient documentation

## 2019-05-30 DIAGNOSIS — D801 Nonfamilial hypogammaglobulinemia: Secondary | ICD-10-CM | POA: Insufficient documentation

## 2019-05-30 LAB — LACTATE DEHYDROGENASE: LDH: 156 U/L (ref 98–192)

## 2019-05-30 LAB — CBC WITH DIFFERENTIAL/PLATELET
Abs Immature Granulocytes: 0.02 10*3/uL (ref 0.00–0.07)
Basophils Absolute: 0.1 10*3/uL (ref 0.0–0.1)
Basophils Relative: 1 %
Eosinophils Absolute: 0.2 10*3/uL (ref 0.0–0.5)
Eosinophils Relative: 3 %
HCT: 37.4 % (ref 36.0–46.0)
Hemoglobin: 11.2 g/dL — ABNORMAL LOW (ref 12.0–15.0)
Immature Granulocytes: 0 %
Lymphocytes Relative: 25 %
Lymphs Abs: 2 10*3/uL (ref 0.7–4.0)
MCH: 28.9 pg (ref 26.0–34.0)
MCHC: 29.9 g/dL — ABNORMAL LOW (ref 30.0–36.0)
MCV: 96.4 fL (ref 80.0–100.0)
Monocytes Absolute: 0.7 10*3/uL (ref 0.1–1.0)
Monocytes Relative: 8 %
Neutro Abs: 5.1 10*3/uL (ref 1.7–7.7)
Neutrophils Relative %: 63 %
Platelets: 284 10*3/uL (ref 150–400)
RBC: 3.88 MIL/uL (ref 3.87–5.11)
RDW: 14.2 % (ref 11.5–15.5)
WBC: 8.1 10*3/uL (ref 4.0–10.5)
nRBC: 0 % (ref 0.0–0.2)

## 2019-05-30 LAB — COMPREHENSIVE METABOLIC PANEL
ALT: 12 U/L (ref 0–44)
AST: 16 U/L (ref 15–41)
Albumin: 3.2 g/dL — ABNORMAL LOW (ref 3.5–5.0)
Alkaline Phosphatase: 111 U/L (ref 38–126)
Anion gap: 12 (ref 5–15)
BUN: 12 mg/dL (ref 8–23)
CO2: 29 mmol/L (ref 22–32)
Calcium: 9.1 mg/dL (ref 8.9–10.3)
Chloride: 99 mmol/L (ref 98–111)
Creatinine, Ser: 1.02 mg/dL — ABNORMAL HIGH (ref 0.44–1.00)
GFR calc Af Amer: >60 mL/min (ref >60)
GFR calc non Af Amer: 58 mL/min — ABNORMAL LOW (ref >60)
Glucose, Bld: 109 mg/dL — ABNORMAL HIGH (ref 70–99)
Potassium: 3.5 mmol/L (ref 3.5–5.1)
Sodium: 140 mmol/L (ref 135–145)
Total Bilirubin: 0.6 mg/dL (ref 0.3–1.2)
Total Protein: 7.4 g/dL (ref 6.5–8.1)

## 2019-05-31 ENCOUNTER — Other Ambulatory Visit (HOSPITAL_COMMUNITY): Payer: Medicare Other

## 2019-05-31 LAB — IGG, IGA, IGM
IgA: <5 mg/dL — ABNORMAL LOW (ref 87–352)
IgG (Immunoglobin G), Serum: 1594 mg/dL (ref 586–1602)
IgM (Immunoglobulin M), Srm: 97 mg/dL (ref 26–217)

## 2019-06-04 ENCOUNTER — Encounter (HOSPITAL_COMMUNITY): Payer: Self-pay

## 2019-06-04 ENCOUNTER — Other Ambulatory Visit: Payer: Self-pay

## 2019-06-04 ENCOUNTER — Inpatient Hospital Stay (HOSPITAL_COMMUNITY): Payer: Medicare Other | Attending: Hematology

## 2019-06-04 VITALS — BP 149/67 | HR 79 | Temp 97.9°F | Resp 18 | Wt 140.2 lb

## 2019-06-04 DIAGNOSIS — C8338 Diffuse large B-cell lymphoma, lymph nodes of multiple sites: Secondary | ICD-10-CM | POA: Insufficient documentation

## 2019-06-04 DIAGNOSIS — D801 Nonfamilial hypogammaglobulinemia: Secondary | ICD-10-CM | POA: Diagnosis not present

## 2019-06-04 MED ORDER — ACETAMINOPHEN 325 MG PO TABS: 650.0000 mg | ORAL_TABLET | Freq: Four times a day (QID) | ORAL | Status: DC | PRN

## 2019-06-04 MED ORDER — DEXTROSE 5 % IV SOLN: INTRAVENOUS | Status: DC

## 2019-06-04 MED ORDER — IMMUNE GLOBULIN (HUMAN) 10 GM/100ML IV SOLN
30.0000 g | Freq: Once | INTRAVENOUS | Status: AC
  Administered 2019-06-04: 30 g via INTRAVENOUS
  Filled 2019-06-04: qty 200

## 2019-06-04 MED ORDER — DIPHENHYDRAMINE HCL 25 MG PO CAPS: 25.0000 mg | ORAL_CAPSULE | Freq: Once | ORAL | Status: DC

## 2019-06-04 NOTE — Patient Instructions (Signed)
[facility] at [facility] °Discharge Instructions ° °Received IVIG infusion today. Follow-up as scheduled ° ° °Thank you for choosing [facility] at [facility] to provide your oncology and hematology care.  To afford each patient quality time with our provider, please arrive at least 15 minutes before your scheduled appointment time.  ° °If you have a lab appointment with the Cancer Center please come in thru the Main Entrance and check in at the main information desk. ° °You need to re-schedule your appointment should you arrive 10 or more minutes late.  We strive to give you quality time with our providers, and arriving late affects you and other patients whose appointments are after yours.  Also, if you no show three or more times for appointments you may be dismissed from the clinic at the providers discretion.     °Again, thank you for choosing [facility].  Our hope is that these requests will decrease the amount of time that you wait before being seen by our physicians.       °_____________________________________________________________ ° °Should you have questions after your visit to [facility], please contact our office at [phone] between the hours of 8:00 a.m. and 4:30 p.m.  Voicemails left after 4:00 p.m. will not be returned until the following business day.  For prescription refill requests, have your pharmacy contact our office and allow 72 hours.   ° °Due to Covid, you will need to wear a mask upon entering the hospital. If you do not have a mask, a mask will be given to you at the Main Entrance upon arrival. For doctor visits, patients may have 1 support person with them. For treatment visits, patients can not have anyone with them due to social distancing guidelines and our immunocompromised population.  ° ° ° °

## 2019-06-04 NOTE — Progress Notes (Signed)
[name] tolerated IVIG infusion well without complaints or incident. VSS upon discharge Peripheral IV site checked with positive blood return noted prior to and after infusion. Pt reported low grade temps in the evenings 4 out of 7 days for several weeks with it reaching 101 on a few days. This information and pt's recent lab results reviewed with RLockamy NP and pt informed to call us if this continues and we will reschedule her next MD appt for sooner. Pt verbalized understanding. Pt discharged self ambulatory in satisfactory condition

## 2019-06-25 ENCOUNTER — Other Ambulatory Visit: Payer: Self-pay | Admitting: Emergency Medicine

## 2019-06-27 DIAGNOSIS — M4004 Postural kyphosis, thoracic region: Secondary | ICD-10-CM | POA: Diagnosis not present

## 2019-06-27 DIAGNOSIS — M47812 Spondylosis without myelopathy or radiculopathy, cervical region: Secondary | ICD-10-CM | POA: Diagnosis not present

## 2019-06-27 DIAGNOSIS — M9902 Segmental and somatic dysfunction of thoracic region: Secondary | ICD-10-CM | POA: Diagnosis not present

## 2019-06-27 DIAGNOSIS — M9901 Segmental and somatic dysfunction of cervical region: Secondary | ICD-10-CM | POA: Diagnosis not present

## 2019-07-01 DIAGNOSIS — M9901 Segmental and somatic dysfunction of cervical region: Secondary | ICD-10-CM | POA: Diagnosis not present

## 2019-07-01 DIAGNOSIS — M4004 Postural kyphosis, thoracic region: Secondary | ICD-10-CM | POA: Diagnosis not present

## 2019-07-01 DIAGNOSIS — M9902 Segmental and somatic dysfunction of thoracic region: Secondary | ICD-10-CM | POA: Diagnosis not present

## 2019-07-01 DIAGNOSIS — M47812 Spondylosis without myelopathy or radiculopathy, cervical region: Secondary | ICD-10-CM | POA: Diagnosis not present

## 2019-07-04 ENCOUNTER — Ambulatory Visit
Admission: EM | Admit: 2019-07-04 | Discharge: 2019-07-04 | Disposition: A | Discharge status: Home / Self Care | Payer: Medicare Other | Attending: Emergency Medicine | Admitting: Emergency Medicine

## 2019-07-04 ENCOUNTER — Ambulatory Visit (INDEPENDENT_AMBULATORY_CARE_PROVIDER_SITE_OTHER): Payer: Medicare Other

## 2019-07-04 ENCOUNTER — Ambulatory Visit: Payer: Self-pay

## 2019-07-04 ENCOUNTER — Other Ambulatory Visit: Payer: Self-pay

## 2019-07-04 DIAGNOSIS — R05 Cough: Secondary | ICD-10-CM

## 2019-07-04 DIAGNOSIS — R509 Fever, unspecified: Secondary | ICD-10-CM

## 2019-07-04 DIAGNOSIS — R059 Cough, unspecified: Secondary | ICD-10-CM

## 2019-07-04 DIAGNOSIS — R9389 Abnormal findings on diagnostic imaging of other specified body structures: Secondary | ICD-10-CM | POA: Diagnosis not present

## 2019-07-04 MED ORDER — PREDNISONE 10 MG (21) PO TBPK
ORAL_TABLET | Freq: Every day | ORAL | 0 refills | Status: DC
Start: 2019-07-04 — End: 2019-08-13

## 2019-07-04 MED ORDER — DEXAMETHASONE SODIUM PHOSPHATE 10 MG/ML IJ SOLN
10.0000 mg | Freq: Once | INTRAMUSCULAR | Status: AC
  Administered 2019-07-04: 10 mg via INTRAMUSCULAR

## 2019-07-04 NOTE — ED Triage Notes (Signed)
Pt began having cough and fever on Tuesday , pt states she coughing so much that she is vomiting. Took tylenol at 9:00. Pt also has some mild SOB.h/o asthma

## 2019-07-04 NOTE — ED Provider Notes (Signed)
[place]   [id number] 07/04/19 Arrival Time: 0093   CC: Cough and fever  SUBJECTIVE: History from: patient.  [name] is a 64 y.o. female hx of asthma, who presents with fever, tmax of 102 at home, and non productive cough x 2 days.  Denies sick exposure to COVID, flu or strep.  Has had both COVID vaccines.  Has tried OTC medications without relief.  Denies aggravating factors.   Reports previous symptoms in the past.   Reports post-tussive episode of vomiting, and SOB that is chronic and stable from COPD.  Denies fever, chills, fatigue, sinus pain, rhinorrhea, sore throat, SOB, wheezing, chest pain, nausea, changes in bowel or bladder habits.    ROS: As per HPI.  All other pertinent ROS negative.     Past Medical History:  Diagnosis Date  . Allergic rhinitis   . Allergy   . Anemia   . Anxiety   . Asthma   . B12 deficiency 01/18/2015  . Blood transfusion without reported diagnosis   . Cataract   . Cavitary lung disease   . Chronic kidney disease    related to cancer  . Chronic sinusitis   . Clotting disorder ([place])   . COPD (chronic obstructive pulmonary disease) ([place])   . Depression   . DVT (deep venous thrombosis) ([place]) 09/25/2014  . Endotracheally intubated   . GERD (gastroesophageal reflux disease)   . Hematuria 04/07/2016  . Hyperlipidemia   . Hypertension   . Hypogammaglobulinemia, acquired ([place]) 12/30/2009   Qualifier: Diagnosis of  By: Joya Gaskins MD, Burnett Harry   . Iron deficiency anemia 01/18/2015  . Myocardial infarction ([place]) 2011  . 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 Non Hodgkin's lymphoma ([place]) 2011  . Orofacial dyskinesia 04/22/2015  . Orofacial dyskinesia 04/22/2015  . Osteoporosis   . Peripheral neuropathy due to chemotherapy ([place]) 05/02/2016  . Pneumonia 01/2013  . PONV (postoperative nausea and vomiting)   . Respiratory failure ([place])   . Sleep apnea   . SVC syndrome 09/25/2014   Past Surgical History:  Procedure Laterality  Date  . ABDOMINAL HYSTERECTOMY     Fibroids  . BASAL CELL CARCINOMA EXCISION  03/2011   scalp  . BREAST SURGERY Right    biopsy  . CATARACT EXTRACTION W/PHACO Left 11/17/2014   Procedure: CATARACT EXTRACTION PHACO AND INTRAOCULAR LENS PLACEMENT LEFT EYE;  Surgeon: Tonny Branch, MD;  Location: [facility];  Service: Ophthalmology;  Laterality: Left;  CDE:5.60  . CATARACT EXTRACTION W/PHACO Right 12/15/2014   Procedure: CATARACT EXTRACTION PHACO AND INTRAOCULAR LENS PLACEMENT RIGHT EYE CDE=5.16;  Surgeon: Tonny Branch, MD;  Location: [facility];  Service: Ophthalmology;  Laterality: Right;  . COLONOSCOPY N/A 11/14/2012   Procedure: COLONOSCOPY;  Surgeon: Rogene Houston, MD;  Location: [facility];  Service: Endoscopy;  Laterality: N/A;  830  . COLONOSCOPY WITH PROPOFOL N/A 01/12/2018   Procedure: COLONOSCOPY WITH PROPOFOL;  Surgeon: Rogene Houston, MD;  Location: [facility];  Service: Endoscopy;  Laterality: N/A;  7:30  . EYE SURGERY    . NASAL SINUS SURGERY    . NECK SURGERY     x 2   . PERIPHERALLY INSERTED CENTRAL CATHETER INSERTION    . PICC Removal    . POLYPECTOMY  01/12/2018   Procedure: POLYPECTOMY;  Surgeon: Rogene Houston, MD;  Location: [facility];  Service: Endoscopy;;  colon  . PORT-A-CATH REMOVAL    . PORTACATH PLACEMENT  7/11   Removed 6/12  .  TRACHEOSTOMY    . VESICOVAGINAL FISTULA CLOSURE W/ TAH     Allergies  Allergen Reactions  . Meperidine Hcl Anaphylaxis  . Montelukast Sodium Hives and Rash   Current Facility-Administered Medications on File Prior to Encounter  Medication Dose Route Frequency Provider Last Rate Last Admin  . 0.9 %  sodium chloride infusion   Intravenous Once Kefalas, Thomas S, PA-C      . 0.9 %  sodium chloride infusion   Intravenous Once Kefalas, Thomas S, PA-C      . acetaminophen (TYLENOL) tablet 650 mg  650 mg Oral Q6H PRN Baird Cancer, PA-C      . acetaminophen (TYLENOL) tablet 650 mg  650 mg Oral Q6H PRN Kefalas, Manon Hilding, PA-C        . dextrose 5 % solution   Intravenous Continuous Kefalas, Manon Hilding, PA-C   Stopped at 08/25/15 1400  . dextrose 5 % solution   Intravenous Continuous Baird Cancer, PA-C   Stopped at 07/13/16 1350  . dextrose 5 % solution   Intravenous Continuous Holley Bouche, NP   Stopped at 02/28/17 1230  . diphenhydrAMINE (BENADRYL) capsule 25 mg  25 mg Oral Once Kefalas, Thomas S, PA-C      . diphenhydrAMINE (BENADRYL) capsule 25 mg  25 mg Oral Once Kefalas, Thomas S, PA-C      . sodium chloride 0.9 % injection 10 mL  10 mL Intracatheter PRN Baird Cancer, PA-C   10 mL at 08/25/15 1125   Current Outpatient Medications on File Prior to Encounter  Medication Sig Dispense Refill  . acetaminophen (TYLENOL) 500 MG tablet Take 1,000 mg by mouth every 6 (six) hours as needed for moderate pain or headache.     [name] Kitchen acyclovir (ZOVIRAX) 400 MG tablet Take 1 tablet (400 mg total) by mouth 2 (two) times daily. 60 tablet 3  . albuterol (PROAIR HFA) 108 (90 Base) MCG/ACT inhaler Inhale 2 puffs into the lungs every 6 (six) hours as needed. (Patient taking differently: Inhale 2 puffs into the lungs every 6 (six) hours as needed for wheezing or shortness of breath. ) 8.5 g 2  . albuterol (PROVENTIL) (2.5 MG/3ML) 0.083% nebulizer solution Take 2.5 mg by nebulization every 4 (four) hours as needed for wheezing or shortness of breath.    . amphetamine-dextroamphetamine (ADDERALL) 10 MG tablet Take 10 mg by mouth every morning.   0  . Armodafinil (NUVIGIL) 150 MG tablet Take 150 mg by mouth every morning.     . budesonide-formoterol (SYMBICORT) 160-4.5 MCG/ACT inhaler Inhale 2 puffs into the lungs 2 (two) times daily. 1 Inhaler 5  . Cholecalciferol (VITAMIN D3 PO) Take 1 capsule by mouth at bedtime.    . citalopram (CELEXA) 20 MG tablet Take 20 mg by mouth every morning.      . cyanocobalamin 1000 MCG tablet Take 1,000 mcg by mouth daily.    . furosemide (LASIX) 20 MG tablet Take 1 tablet (20 mg total) by mouth as  needed for fluid. 90 tablet 1  . gabapentin (NEURONTIN) 300 MG capsule Take 300 mg by mouth 2 (two) times daily.   1  . gabapentin (NEURONTIN) 600 MG tablet     . mupirocin ointment (BACTROBAN) 2 %     . olmesartan (BENICAR) 40 MG tablet Take 20-40 mg by mouth daily.    [name] Kitchen omeprazole (PRILOSEC) 20 MG capsule Take 1 capsule (20 mg total) by mouth 2 (two) times daily. 180 capsule 2  . predniSONE (  DELTASONE) 5 MG tablet TAKE 1 TABLET BY MOUTH ONCE A DAY. 30 tablet 0  . simvastatin (ZOCOR) 80 MG tablet Take 1 tablet (80 mg total) by mouth at bedtime. 90 tablet 3  . zafirlukast (ACCOLATE) 20 MG tablet TAKE 1 TABLET BY MOUTH TWICE DAILY. 60 tablet 0   Social History   Socioeconomic History  . Marital status: Divorced    Spouse name: Not on file  . Number of children: 3  . Years of education: 2  . Highest education level: Not on file  Occupational History  . Occupation: Estate agent    Comment: [employer]  . Smoking status: Never Smoker  . Smokeless tobacco: Never Used  Vaping Use  . Vaping Use: Never used  Substance and Sexual Activity  . Alcohol use: No  . Drug use: No  . Sexual activity: Not Currently    Birth control/protection: Surgical    Comment: hyst  Other Topics Concern  . Not on file  Social History Narrative   Originally from [place]. Always lived in [place]. Prior travel to [place]. 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 She has 1 dog currently. She has 1 conures (small parrots). No mold exposure in her home. 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 No hot tub exposure. She enjoys sewing & quilting.    Social Determinants of Health   Financial Resource Strain:   . Difficulty of Paying Living Expenses:   Food Insecurity:   . Worried About Charity fundraiser in the Last Year:   . Arboriculturist in the Last Year:   Transportation Needs:   . Film/video editor (Medical):   [name] Kitchen Lack of  Transportation (Non-Medical):   Physical Activity:   . Days of Exercise per Week:   . Minutes of Exercise per Session:   Stress:   . Feeling of Stress :   Social Connections:   . Frequency of Communication with Friends and Family:   . Frequency of Social Gatherings with Friends and Family:   . Attends Religious Services:   . Active Member of Clubs or Organizations:   . Attends Archivist Meetings:   [name] Kitchen Marital Status:   Intimate Partner Violence:   . Fear of Current or Ex-Partner:   . Emotionally Abused:   [name] Kitchen Physically Abused:   . Sexually Abused:    Family History  Problem Relation Age of Onset  . Emphysema Mother   . Stroke Mother   . COPD Mother   . Heart disease Mother        died in sleep  76  . Allergies Father   . Asthma Father        as a child  . Arthritis Father   . Parkinson's disease Father 64  . Leukemia Maternal Grandmother   . Cancer Maternal Grandmother        Leukemia  . Diabetes Brother   . Heart attack Brother   . Hypertension Brother   . Heart disease Brother 40       stents  . Hypertension Sister   . Hypertension Brother     OBJECTIVE:  Vitals:   07/04/19 0941  BP: 122/78  Pulse: 89  Resp: 20  Temp: 99.4 F (37.4 C)  SpO2: 94%    General appearance: alert; appears fatigued, but nontoxic; speaking in full sentences and tolerating own secretions HEENT:  NCAT; Ears: EACs clear, TMs pearly gray; Eyes: PERRL.  EOM grossly intact. Nose: nares patent without rhinorrhea, Throat: oropharynx clear, tonsils non erythematous or enlarged, uvula midline  Neck: supple without LAD Lungs: unlabored respirations, symmetrical air entry; cough: mild; no respiratory distress; wheezes and crackles heard about bilateral lung fields Heart: regular rate and rhythm.   Skin: warm and dry Psychological: alert and cooperative; normal mood and affect  DIAGNOSTIC STUDIES: DG Chest 2 View  Result Date: 07/04/2019 CLINICAL DATA:  64 year old female with  history of cough and fever. EXAM: CHEST - 2 VIEW COMPARISON:  Chest x-ray 05/20/2016. FINDINGS: Coarse interstitial markings are again noted throughout the lungs bilaterally, most evident throughout the mid to lower lungs, similar to prior studies. Lung volumes are normal. No consolidative airspace disease. No pleural effusions. No pneumothorax. No pulmonary nodule or mass noted. Pulmonary vasculature and the cardiomediastinal silhouette are within normal limits. Atherosclerosis in the thoracic aorta. Orthopedic fixation hardware noted in the lower cervical spine. IMPRESSION: 1. No radiographic evidence of acute cardiopulmonary disease. 2. Chronically increased interstitial markings in the lungs bilaterally, which could indicate underlying interstitial lung disease. Further evaluation with nonemergent high-resolution chest CT could be considered if clinically appropriate. 3. Aortic atherosclerosis. Electronically Signed   By: Vinnie Langton M.D.   On: 07/04/2019 09:59    I have reviewed the x-rays myself and the radiologist interpretation. I am in agreement with the radiologist interpretation.     ASSESSMENT & PLAN:  1. Cough   2. Fever, unspecified   3. Abnormal chest x-ray     Meds ordered this encounter  Medications  . predniSONE (STERAPRED UNI-PAK 21 TAB) 10 MG (21) TBPK tablet    Sig: Take by mouth daily. Take 6 tabs by mouth daily  for 2 days, then 5 tabs for 2 days, then 4 tabs for 2 days, then 3 tabs for 2 days, 2 tabs for 2 days, then 1 tab by mouth daily for 2 days    Dispense:  42 tablet    Refill:  0    Order Specific Question:   Supervising Provider    Answer:   [name] [[id number]]  . dexamethasone (DECADRON) injection 10 mg   Steroid shot given in office Get plenty of rest and push fluids Prednisone taper prescribed.   Use OTC medications like ibuprofen or tylenol as needed fever or pain Follow up with PCP regarding radiologist read of chest x-ray Call or go to the  ED if you have any new or worsening symptoms such as fever, worsening cough, shortness of breath, chest tightness, chest pain, turning blue, changes in mental status, etc...   Reviewed expectations re: course of current medical issues. Questions answered. Outlined signs and symptoms indicating need for more acute intervention. Patient verbalized understanding. After Visit Summary given.         Lestine Box, PA-C 07/04/19 1021

## 2019-07-04 NOTE — Discharge Instructions (Signed)
Steroid shot given in office Get plenty of rest and push fluids Prednisone taper prescribed.   Use OTC medications like ibuprofen or tylenol as needed fever or pain Follow up with PCP regarding radiologist read of chest x-ray Call or go to the ED if you have any new or worsening symptoms such as fever, worsening cough, shortness of breath, chest tightness, chest pain, turning blue, changes in mental status, etc..[name] Kitchen

## 2019-07-05 DIAGNOSIS — L600 Ingrowing nail: Secondary | ICD-10-CM | POA: Diagnosis not present

## 2019-07-05 DIAGNOSIS — M79674 Pain in right toe(s): Secondary | ICD-10-CM | POA: Diagnosis not present

## 2019-07-08 DIAGNOSIS — M47812 Spondylosis without myelopathy or radiculopathy, cervical region: Secondary | ICD-10-CM | POA: Diagnosis not present

## 2019-07-08 DIAGNOSIS — M4004 Postural kyphosis, thoracic region: Secondary | ICD-10-CM | POA: Diagnosis not present

## 2019-07-08 DIAGNOSIS — M9901 Segmental and somatic dysfunction of cervical region: Secondary | ICD-10-CM | POA: Diagnosis not present

## 2019-07-08 DIAGNOSIS — M9902 Segmental and somatic dysfunction of thoracic region: Secondary | ICD-10-CM | POA: Diagnosis not present

## 2019-07-10 DIAGNOSIS — R5383 Other fatigue: Secondary | ICD-10-CM | POA: Diagnosis not present

## 2019-07-10 DIAGNOSIS — J069 Acute upper respiratory infection, unspecified: Secondary | ICD-10-CM | POA: Diagnosis not present

## 2019-07-10 DIAGNOSIS — R7303 Prediabetes: Secondary | ICD-10-CM | POA: Diagnosis not present

## 2019-07-10 DIAGNOSIS — Z6823 Body mass index (BMI) 23.0-23.9, adult: Secondary | ICD-10-CM | POA: Diagnosis not present

## 2019-07-10 DIAGNOSIS — J45909 Unspecified asthma, uncomplicated: Secondary | ICD-10-CM | POA: Diagnosis not present

## 2019-07-16 DIAGNOSIS — M47812 Spondylosis without myelopathy or radiculopathy, cervical region: Secondary | ICD-10-CM | POA: Diagnosis not present

## 2019-07-16 DIAGNOSIS — M9901 Segmental and somatic dysfunction of cervical region: Secondary | ICD-10-CM | POA: Diagnosis not present

## 2019-07-16 DIAGNOSIS — M9902 Segmental and somatic dysfunction of thoracic region: Secondary | ICD-10-CM | POA: Diagnosis not present

## 2019-07-16 DIAGNOSIS — M4004 Postural kyphosis, thoracic region: Secondary | ICD-10-CM | POA: Diagnosis not present

## 2019-07-18 DIAGNOSIS — M47812 Spondylosis without myelopathy or radiculopathy, cervical region: Secondary | ICD-10-CM | POA: Diagnosis not present

## 2019-07-18 DIAGNOSIS — M9901 Segmental and somatic dysfunction of cervical region: Secondary | ICD-10-CM | POA: Diagnosis not present

## 2019-07-18 DIAGNOSIS — M9902 Segmental and somatic dysfunction of thoracic region: Secondary | ICD-10-CM | POA: Diagnosis not present

## 2019-07-18 DIAGNOSIS — M4004 Postural kyphosis, thoracic region: Secondary | ICD-10-CM | POA: Diagnosis not present

## 2019-07-26 DIAGNOSIS — M4004 Postural kyphosis, thoracic region: Secondary | ICD-10-CM | POA: Diagnosis not present

## 2019-07-26 DIAGNOSIS — M47812 Spondylosis without myelopathy or radiculopathy, cervical region: Secondary | ICD-10-CM | POA: Diagnosis not present

## 2019-07-26 DIAGNOSIS — M9902 Segmental and somatic dysfunction of thoracic region: Secondary | ICD-10-CM | POA: Diagnosis not present

## 2019-07-26 DIAGNOSIS — M9901 Segmental and somatic dysfunction of cervical region: Secondary | ICD-10-CM | POA: Diagnosis not present

## 2019-07-29 ENCOUNTER — Other Ambulatory Visit (HOSPITAL_COMMUNITY): Payer: Medicare Other

## 2019-07-30 ENCOUNTER — Ambulatory Visit (HOSPITAL_COMMUNITY): Payer: Medicare Other

## 2019-07-30 ENCOUNTER — Ambulatory Visit (HOSPITAL_COMMUNITY): Payer: Medicare Other | Admitting: Hematology

## 2019-08-02 DIAGNOSIS — M9901 Segmental and somatic dysfunction of cervical region: Secondary | ICD-10-CM | POA: Diagnosis not present

## 2019-08-02 DIAGNOSIS — M4004 Postural kyphosis, thoracic region: Secondary | ICD-10-CM | POA: Diagnosis not present

## 2019-08-02 DIAGNOSIS — M9902 Segmental and somatic dysfunction of thoracic region: Secondary | ICD-10-CM | POA: Diagnosis not present

## 2019-08-02 DIAGNOSIS — M47812 Spondylosis without myelopathy or radiculopathy, cervical region: Secondary | ICD-10-CM | POA: Diagnosis not present

## 2019-08-12 ENCOUNTER — Other Ambulatory Visit (HOSPITAL_COMMUNITY): Payer: Self-pay

## 2019-08-12 ENCOUNTER — Inpatient Hospital Stay (HOSPITAL_COMMUNITY): Payer: Medicare Other | Attending: Hematology

## 2019-08-12 ENCOUNTER — Other Ambulatory Visit: Payer: Self-pay

## 2019-08-12 DIAGNOSIS — C8330 Diffuse large B-cell lymphoma, unspecified site: Secondary | ICD-10-CM | POA: Insufficient documentation

## 2019-08-12 DIAGNOSIS — R0789 Other chest pain: Secondary | ICD-10-CM

## 2019-08-12 DIAGNOSIS — D801 Nonfamilial hypogammaglobulinemia: Secondary | ICD-10-CM | POA: Insufficient documentation

## 2019-08-12 DIAGNOSIS — C8338 Diffuse large B-cell lymphoma, lymph nodes of multiple sites: Secondary | ICD-10-CM

## 2019-08-12 LAB — COMPREHENSIVE METABOLIC PANEL
ALT: 13 U/L (ref 0–44)
AST: 16 U/L (ref 15–41)
Albumin: 3.3 g/dL — ABNORMAL LOW (ref 3.5–5.0)
Alkaline Phosphatase: 88 U/L (ref 38–126)
Anion gap: 10 (ref 5–15)
BUN: 14 mg/dL (ref 8–23)
CO2: 28 mmol/L (ref 22–32)
Calcium: 8.7 mg/dL — ABNORMAL LOW (ref 8.9–10.3)
Chloride: 102 mmol/L (ref 98–111)
Creatinine, Ser: 0.98 mg/dL (ref 0.44–1.00)
GFR calc Af Amer: >60 mL/min (ref >60)
GFR calc non Af Amer: >60 mL/min (ref >60)
Glucose, Bld: 91 mg/dL (ref 70–99)
Potassium: 3.7 mmol/L (ref 3.5–5.1)
Sodium: 140 mmol/L (ref 135–145)
Total Bilirubin: 0.6 mg/dL (ref 0.3–1.2)
Total Protein: 6.5 g/dL (ref 6.5–8.1)

## 2019-08-12 LAB — VITAMIN B12: Vitamin B-12: 1033 pg/mL — ABNORMAL HIGH (ref 180–914)

## 2019-08-12 LAB — CBC WITH DIFFERENTIAL/PLATELET
Abs Immature Granulocytes: 0.02 10*3/uL (ref 0.00–0.07)
Basophils Absolute: 0 10*3/uL (ref 0.0–0.1)
Basophils Relative: 1 %
Eosinophils Absolute: 0.1 10*3/uL (ref 0.0–0.5)
Eosinophils Relative: 2 %
HCT: 35.8 % — ABNORMAL LOW (ref 36.0–46.0)
Hemoglobin: 11 g/dL — ABNORMAL LOW (ref 12.0–15.0)
Immature Granulocytes: 0 %
Lymphocytes Relative: 31 %
Lymphs Abs: 1.8 10*3/uL (ref 0.7–4.0)
MCH: 29.3 pg (ref 26.0–34.0)
MCHC: 30.7 g/dL (ref 30.0–36.0)
MCV: 95.2 fL (ref 80.0–100.0)
Monocytes Absolute: 0.5 10*3/uL (ref 0.1–1.0)
Monocytes Relative: 8 %
Neutro Abs: 3.5 10*3/uL (ref 1.7–7.7)
Neutrophils Relative %: 58 %
Platelets: 175 10*3/uL (ref 150–400)
RBC: 3.76 MIL/uL — ABNORMAL LOW (ref 3.87–5.11)
RDW: 14.6 % (ref 11.5–15.5)
WBC: 5.9 10*3/uL (ref 4.0–10.5)
nRBC: 0 % (ref 0.0–0.2)

## 2019-08-12 LAB — LACTATE DEHYDROGENASE: LDH: 158 U/L (ref 98–192)

## 2019-08-12 LAB — TROPONIN I (HIGH SENSITIVITY): Troponin I (High Sensitivity): 6 ng/L (ref <18)

## 2019-08-12 LAB — CK: Total CK: 46 U/L (ref 38–234)

## 2019-08-12 LAB — VITAMIN D 25 HYDROXY (VIT D DEFICIENCY, FRACTURES): Vit D, 25-Hydroxy: 36.23 ng/mL (ref 30–100)

## 2019-08-13 ENCOUNTER — Encounter (HOSPITAL_COMMUNITY): Payer: Self-pay | Admitting: Hematology

## 2019-08-13 ENCOUNTER — Inpatient Hospital Stay (HOSPITAL_BASED_OUTPATIENT_CLINIC_OR_DEPARTMENT_OTHER): Payer: Medicare Other | Admitting: Hematology

## 2019-08-13 ENCOUNTER — Other Ambulatory Visit: Payer: Self-pay

## 2019-08-13 ENCOUNTER — Inpatient Hospital Stay (HOSPITAL_COMMUNITY): Payer: Medicare Other

## 2019-08-13 VITALS — BP 128/73 | HR 67 | Temp 97.2°F | Resp 18

## 2019-08-13 VITALS — BP 136/61 | HR 65 | Temp 98.0°F | Resp 20 | Wt 135.4 lb

## 2019-08-13 DIAGNOSIS — D801 Nonfamilial hypogammaglobulinemia: Secondary | ICD-10-CM | POA: Diagnosis not present

## 2019-08-13 DIAGNOSIS — M9902 Segmental and somatic dysfunction of thoracic region: Secondary | ICD-10-CM | POA: Diagnosis not present

## 2019-08-13 DIAGNOSIS — M9901 Segmental and somatic dysfunction of cervical region: Secondary | ICD-10-CM | POA: Diagnosis not present

## 2019-08-13 DIAGNOSIS — C8338 Diffuse large B-cell lymphoma, lymph nodes of multiple sites: Secondary | ICD-10-CM

## 2019-08-13 DIAGNOSIS — M47812 Spondylosis without myelopathy or radiculopathy, cervical region: Secondary | ICD-10-CM | POA: Diagnosis not present

## 2019-08-13 DIAGNOSIS — C8330 Diffuse large B-cell lymphoma, unspecified site: Secondary | ICD-10-CM | POA: Diagnosis not present

## 2019-08-13 DIAGNOSIS — M4004 Postural kyphosis, thoracic region: Secondary | ICD-10-CM | POA: Diagnosis not present

## 2019-08-13 LAB — IGG, IGA, IGM
IgA: <5 mg/dL — ABNORMAL LOW (ref 87–352)
IgG (Immunoglobin G), Serum: 1208 mg/dL (ref 586–1602)
IgM (Immunoglobulin M), Srm: 102 mg/dL (ref 26–217)

## 2019-08-13 MED ORDER — IMMUNE GLOBULIN (HUMAN) 10 GM/100ML IV SOLN
30.0000 g | Freq: Once | INTRAVENOUS | Status: AC
  Administered 2019-08-13: 30 g via INTRAVENOUS
  Filled 2019-08-13: qty 200

## 2019-08-13 MED ORDER — DEXTROSE 5 % IV SOLN: INTRAVENOUS | Status: DC

## 2019-08-13 MED ORDER — ACETAMINOPHEN 325 MG PO TABS: 650.0000 mg | ORAL_TABLET | Freq: Four times a day (QID) | ORAL | Status: DC | PRN

## 2019-08-13 MED ORDER — DIPHENHYDRAMINE HCL 25 MG PO CAPS: 25.0000 mg | ORAL_CAPSULE | Freq: Once | ORAL | Status: DC

## 2019-08-13 NOTE — Patient Instructions (Signed)
[place] at [facility] Discharge Instructions  You were seen today by Dr. Delton Coombes. He went over your recent results. You received your infusion today; your next infusion will be in 8 weeks. Dr. Delton Coombes will see you back in 4 months for labs and follow up.   Thank you for choosing [place] at [facility] to provide your oncology and hematology care.  To afford each patient quality time with our provider, please arrive at least 15 minutes before your scheduled appointment time.   If you have a lab appointment with the [facility] please come in thru the Main Entrance and check in at the main information desk  You need to re-schedule your appointment should you arrive 10 or more minutes late.  We strive to give you quality time with our providers, and arriving late affects you and other patients whose appointments are after yours.  Also, if you no show three or more times for appointments you may be dismissed from the clinic at the providers discretion.     Again, thank you for choosing [facility].  Our hope is that these requests will decrease the amount of time that you wait before being seen by our physicians.       _____________________________________________________________  Should you have questions after your visit to [facility], please contact our office at [phone] between the hours of 8:00 a.m. and 4:30 p.m.  Voicemails left after 4:00 p.m. will not be returned until the following business day.  For prescription refill requests, have your pharmacy contact our office and allow 72 hours.    Cancer Center Support Programs:   > Cancer Support Group  2nd Tuesday of the month 1pm-2pm, Journey Room

## 2019-08-13 NOTE — Patient Instructions (Signed)
[facility] at [facility]  Discharge Instructions:   _______________________________________________________________  Thank you for choosing [facility] at [facility] to provide your oncology and hematology care.  To afford each patient quality time with our providers, please arrive at least 15 minutes before your scheduled appointment.  You need to re-schedule your appointment if you arrive 10 or more minutes late.  We strive to give you quality time with our providers, and arriving late affects you and other patients whose appointments are after yours.  Also, if you no show three or more times for appointments you may be dismissed from the clinic.  Again, thank you for choosing [facility] at [facility]. Our hope is that these requests will allow you access to exceptional care and in a timely manner. _______________________________________________________________  If you have questions after your visit, please contact our office at [phone] between the hours of 8:30 a.m. and 5:00 p.m. Voicemails left after 4:30 p.m. will not be returned until the following business day. _______________________________________________________________  For prescription refill requests, have your pharmacy contact our office. _______________________________________________________________  Recommendations made by the consultant and any test results will be sent to your referring physician. _______________________________________________________________ 

## 2019-08-13 NOTE — Progress Notes (Signed)
[place], [place] [address]   CLINIC:  Medical Oncology/Hematology  PCP:  Sharilyn Sites, [address] [place] / [place] [address]  [phone]  REASON FOR VISIT:  Follow-up for acquired hypogammaglobulinemia and DLBCL  PRIOR THERAPY: R-CHOP x 4 cycles with intrathecal methotrexate during cycle 2 and 4.  CURRENT THERAPY: IVIG every 8 weeks.  INTERVAL HISTORY:  [name], a 64 y.o. female, returns for routine follow-up for her acquired hypogammaglobulinemia and DLBCL. [name] was last seen on 04/09/2019. Her last IVIG infusion was on 6/1.  Today she reports that she went to [place] on 7/1 and she took a prednisone, but no antibiotics. She continues having sinus congestion with green sputum production, which is normal for her. She continues having fevers, but no chills, night sweats or weight loss.  She got her second COVID vaccine.   REVIEW OF SYSTEMS:  Review of Systems  Constitutional: Positive for appetite change (mildly decreased), fatigue (mild) and fever (occasional). Negative for chills, diaphoresis and unexpected weight change.  Respiratory: Positive for shortness of breath (d/t asthma).   Cardiovascular: Positive for chest pain.  Neurological: Positive for numbness (hands).  Psychiatric/Behavioral: Positive for sleep disturbance.  All other systems reviewed and are negative.   PAST MEDICAL/SURGICAL HISTORY:  Past Medical History:  Diagnosis Date  . Allergic rhinitis   . Allergy   . Anemia   . Anxiety   . Asthma   . B12 deficiency 01/18/2015  . Blood transfusion without reported diagnosis   . Cataract   . Cavitary lung disease   . Chronic kidney disease    related to cancer  . Chronic sinusitis   . Clotting disorder ([facility])   . COPD (chronic obstructive pulmonary disease) ([place] [place])   . Depression   . DVT (deep venous thrombosis) ([facility]) 09/25/2014  . Endotracheally intubated   . GERD (gastroesophageal reflux  disease)   . Hematuria 04/07/2016  . Hyperlipidemia   . Hypertension   . Hypogammaglobulinemia, acquired ([place]) 12/30/2009   Qualifier: Diagnosis of  By: Joya Gaskins MD, Burnett Harry   . Iron deficiency anemia 01/18/2015  . Myocardial infarction ([facility]) 2011  . 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 Non Hodgkin's lymphoma ([facility]) 2011  . Orofacial dyskinesia 04/22/2015  . Orofacial dyskinesia 04/22/2015  . Osteoporosis   . Peripheral neuropathy due to chemotherapy ([facility]) 05/02/2016  . Pneumonia 01/2013  . PONV (postoperative nausea and vomiting)   . Respiratory failure ([facility])   . Sleep apnea   . SVC syndrome 09/25/2014   Past Surgical History:  Procedure Laterality Date  . ABDOMINAL HYSTERECTOMY     Fibroids  . BASAL CELL CARCINOMA EXCISION  03/2011   scalp  . BREAST SURGERY Right    biopsy  . CATARACT EXTRACTION W/PHACO Left 11/17/2014   Procedure: CATARACT EXTRACTION PHACO AND INTRAOCULAR LENS PLACEMENT LEFT EYE;  Surgeon: Tonny Branch, MD;  Location: [facility];  Service: Ophthalmology;  Laterality: Left;  CDE:5.60  . CATARACT EXTRACTION W/PHACO Right 12/15/2014   Procedure: CATARACT EXTRACTION PHACO AND INTRAOCULAR LENS PLACEMENT RIGHT EYE CDE=5.16;  Surgeon: Tonny Branch, MD;  Location: [facility];  Service: Ophthalmology;  Laterality: Right;  . COLONOSCOPY N/A 11/14/2012   Procedure: COLONOSCOPY;  Surgeon: Rogene Houston, MD;  Location: [facility];  Service: Endoscopy;  Laterality: N/A;  830  . COLONOSCOPY WITH PROPOFOL N/A 01/12/2018   Procedure: COLONOSCOPY WITH PROPOFOL;  Surgeon: Rogene Houston, MD;  Location: [facility];  Service:  Endoscopy;  Laterality: N/A;  7:30  . EYE SURGERY    . NASAL SINUS SURGERY    . NECK SURGERY     x 2   . PERIPHERALLY INSERTED CENTRAL CATHETER INSERTION    . PICC Removal    . POLYPECTOMY  01/12/2018   Procedure: POLYPECTOMY;  Surgeon: Rogene Houston, MD;  Location: [facility];  Service: Endoscopy;;  colon  . PORT-A-CATH REMOVAL    . PORTACATH  PLACEMENT  7/11   Removed 6/12  . TRACHEOSTOMY    . VESICOVAGINAL FISTULA CLOSURE W/ TAH      SOCIAL HISTORY:  Social History   Socioeconomic History  . Marital status: Divorced    Spouse name: Not on file  . Number of children: 3  . Years of education: 39  . Highest education level: Not on file  Occupational History  . Occupation: Estate agent    Comment: [employer]  . Smoking status: Never Smoker  . Smokeless tobacco: Never Used  Vaping Use  . Vaping Use: Never used  Substance and Sexual Activity  . Alcohol use: No  . Drug use: No  . Sexual activity: Not Currently    Birth control/protection: Surgical    Comment: hyst  Other Topics Concern  . Not on file  Social History Narrative   Originally from [place]. Always lived in [place]. Prior travel to [place]. 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 She has 1 dog currently. She has 1 conures (small parrots). No mold exposure in her home. 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 No hot tub exposure. She enjoys sewing & quilting.    Social Determinants of Health   Financial Resource Strain:   . Difficulty of Paying Living Expenses:   Food Insecurity:   . Worried About Charity fundraiser in the Last Year:   . Arboriculturist in the Last Year:   Transportation Needs:   . Film/video editor (Medical):   [name] Kitchen Lack of Transportation (Non-Medical):   Physical Activity:   . Days of Exercise per Week:   . Minutes of Exercise per Session:   Stress:   . Feeling of Stress :   Social Connections:   . Frequency of Communication with Friends and Family:   . Frequency of Social Gatherings with Friends and Family:   . Attends Religious Services:   . Active Member of Clubs or Organizations:   . Attends Archivist Meetings:   [name] Kitchen Marital Status:   Intimate Partner Violence:   . Fear of Current or Ex-Partner:   . Emotionally Abused:   [name] Kitchen Physically  Abused:   . Sexually Abused:     FAMILY HISTORY:  Family History  Problem Relation Age of Onset  . Emphysema Mother   . Stroke Mother   . COPD Mother   . Heart disease Mother        died in sleep  59  . Allergies Father   . Asthma Father        as a child  . Arthritis Father   . Parkinson's disease Father 17  . Leukemia Maternal Grandmother   . Cancer Maternal Grandmother        Leukemia  . Diabetes Brother   . Heart attack Brother   . Hypertension Brother   . Heart disease Brother 40       stents  .  Hypertension Sister   . Hypertension Brother     CURRENT MEDICATIONS:  Current Outpatient Medications  Medication Sig Dispense Refill  . acetaminophen (TYLENOL) 500 MG tablet Take 1,000 mg by mouth every 6 (six) hours as needed for moderate pain or headache.     [name] Kitchen acyclovir (ZOVIRAX) 400 MG tablet Take 1 tablet (400 mg total) by mouth 2 (two) times daily. 60 tablet 3  . albuterol (PROAIR HFA) 108 (90 Base) MCG/ACT inhaler Inhale 2 puffs into the lungs every 6 (six) hours as needed. (Patient taking differently: Inhale 2 puffs into the lungs every 6 (six) hours as needed for wheezing or shortness of breath. ) 8.5 g 2  . albuterol (PROVENTIL) (2.5 MG/3ML) 0.083% nebulizer solution Take 2.5 mg by nebulization every 4 (four) hours as needed for wheezing or shortness of breath.    . amphetamine-dextroamphetamine (ADDERALL) 10 MG tablet Take 10 mg by mouth every morning.   0  . Armodafinil (NUVIGIL) 150 MG tablet Take 150 mg by mouth every morning.     . budesonide-formoterol (SYMBICORT) 160-4.5 MCG/ACT inhaler Inhale 2 puffs into the lungs 2 (two) times daily. 1 Inhaler 5  . Cholecalciferol (VITAMIN D3 PO) Take 1 capsule by mouth at bedtime.    . citalopram (CELEXA) 20 MG tablet Take 20 mg by mouth every morning.      . cyanocobalamin 1000 MCG tablet Take 1,000 mcg by mouth daily.    . furosemide (LASIX) 20 MG tablet Take 1 tablet (20 mg total) by mouth as needed for fluid. 90 tablet  1  . gabapentin (NEURONTIN) 300 MG capsule Take 300 mg by mouth 2 (two) times daily.   1  . gabapentin (NEURONTIN) 600 MG tablet     . mupirocin ointment (BACTROBAN) 2 %     . olmesartan (BENICAR) 40 MG tablet Take 20-40 mg by mouth daily.    [name] Kitchen omeprazole (PRILOSEC) 20 MG capsule Take 1 capsule (20 mg total) by mouth 2 (two) times daily. 180 capsule 2  . predniSONE (DELTASONE) 5 MG tablet TAKE 1 TABLET BY MOUTH ONCE A DAY. 30 tablet 0  . predniSONE (STERAPRED UNI-PAK 21 TAB) 10 MG (21) TBPK tablet Take by mouth daily. Take 6 tabs by mouth daily  for 2 days, then 5 tabs for 2 days, then 4 tabs for 2 days, then 3 tabs for 2 days, 2 tabs for 2 days, then 1 tab by mouth daily for 2 days 42 tablet 0  . simvastatin (ZOCOR) 80 MG tablet Take 1 tablet (80 mg total) by mouth at bedtime. 90 tablet 3  . zafirlukast (ACCOLATE) 20 MG tablet TAKE 1 TABLET BY MOUTH TWICE DAILY. 60 tablet 0   No current facility-administered medications for this visit.   Facility-Administered Medications Ordered in Other Visits  Medication Dose Route Frequency Provider Last Rate Last Admin  . 0.9 %  sodium chloride infusion   Intravenous Once Kefalas, Thomas S, PA-C      . 0.9 %  sodium chloride infusion   Intravenous Once Kefalas, Thomas S, PA-C      . acetaminophen (TYLENOL) tablet 650 mg  650 mg Oral Q6H PRN Baird Cancer, PA-C      . acetaminophen (TYLENOL) tablet 650 mg  650 mg Oral Q6H PRN Kefalas, Manon Hilding, PA-C      . dextrose 5 % solution   Intravenous Continuous Kefalas, Manon Hilding, PA-C   Stopped at 08/25/15 1400  . dextrose 5 % solution   Intravenous Continuous  Baird Cancer, PA-C   Stopped at 07/13/16 1350  . dextrose 5 % solution   Intravenous Continuous Holley Bouche, NP   Stopped at 02/28/17 1230  . diphenhydrAMINE (BENADRYL) capsule 25 mg  25 mg Oral Once Kefalas, Thomas S, PA-C      . diphenhydrAMINE (BENADRYL) capsule 25 mg  25 mg Oral Once Kefalas, Thomas S, PA-C      . sodium chloride 0.9 %  injection 10 mL  10 mL Intracatheter PRN Baird Cancer, PA-C   10 mL at 08/25/15 1125    ALLERGIES:  Allergies  Allergen Reactions  . Meperidine Hcl Anaphylaxis  . Montelukast Sodium Hives and Rash    PHYSICAL EXAM:  Performance status (ECOG): 1 - Symptomatic but completely ambulatory  There were no vitals filed for this visit. Wt Readings from Last 3 Encounters:  06/04/19 140 lb 3.2 oz (63.6 kg)  04/09/19 139 lb 4 oz (63.2 kg)  02/12/19 135 lb (61.2 kg)   Physical Exam Vitals reviewed.  Constitutional:      Appearance: Normal appearance.  Cardiovascular:     Rate and Rhythm: Normal rate and regular rhythm.     Pulses: Normal pulses.     Heart sounds: Normal heart sounds.  Pulmonary:     Effort: Pulmonary effort is normal.     Breath sounds: Normal breath sounds.  Abdominal:     Palpations: Abdomen is soft. There is no hepatomegaly, splenomegaly or mass.     Tenderness: There is no abdominal tenderness.     Hernia: No hernia is present.  Musculoskeletal:     Right lower leg: No edema.     Left lower leg: No edema.  Lymphadenopathy:     Cervical: No cervical adenopathy.     Upper Body:     Right upper body: No supraclavicular or axillary adenopathy.     Left upper body: No supraclavicular or axillary adenopathy.     Lower Body: No right inguinal adenopathy. No left inguinal adenopathy.  Neurological:     General: No focal deficit present.     Mental Status: She is alert and oriented to person, place, and time.  Psychiatric:        Mood and Affect: Mood normal.        Behavior: Behavior normal.     LABORATORY DATA:  I have reviewed the labs as listed.  CBC Latest Ref Rng & Units 08/12/2019 05/30/2019 04/04/2019  WBC 4.0 - 10.5 K/uL 5.9 8.1 6.4  Hemoglobin 12.0 - 15.0 g/dL 11.0(L) 11.2(L) 12.0  Hematocrit 36 - 46 % 35.8(L) 37.4 39.6  Platelets 150 - 400 K/uL 175 284 175   CMP Latest Ref Rng & Units 08/12/2019 05/30/2019 04/04/2019  Glucose 70 - 99 mg/dL 91 109(H)  105(H)  BUN 8 - 23 mg/dL 14 12 14   Creatinine 0.44 - 1.00 mg/dL 0.98 1.02(H) 0.99  Sodium 135 - 145 mmol/L 140 140 140  Potassium 3.5 - 5.1 mmol/L 3.7 3.5 3.7  Chloride 98 - 111 mmol/L 102 99 101  CO2 22 - 32 mmol/L 28 29 30   Calcium 8.9 - 10.3 mg/dL 8.7(L) 9.1 8.9  Total Protein 6.5 - 8.1 g/dL 6.5 7.4 6.9  Total Bilirubin 0.3 - 1.2 mg/dL 0.6 0.6 0.5  Alkaline Phos 38 - 126 U/L 88 111 124  AST 15 - 41 U/L 16 16 18   ALT 0 - 44 U/L 13 12 13       Component Value Date/Time   RBC 3.76 (  L) 08/12/2019 0831   MCV 95.2 08/12/2019 0831   MCH 29.3 08/12/2019 0831   MCHC 30.7 08/12/2019 0831   RDW 14.6 08/12/2019 0831   LYMPHSABS 1.8 08/12/2019 0831   MONOABS 0.5 08/12/2019 0831   EOSABS 0.1 08/12/2019 0831   BASOSABS 0.0 08/12/2019 0831   Lab Results  Component Value Date   LDH 158 08/12/2019   LDH 156 05/30/2019   LDH 148 04/04/2019   Lab Results  Component Value Date   VD25OH 36.23 08/12/2019   VD25OH 35.89 04/04/2019   VD25OH 65 05/02/2016   Lab Results  Component Value Date   IGGSERUM 1,208 08/12/2019   IGGSERUM 1,594 05/30/2019   IGGSERUM 1,097 04/04/2019   IGA <5 (L) 08/12/2019   IGA <5 (L) 05/30/2019   IGA <5 (L) 04/04/2019   IGMSERUM 102 08/12/2019   IGMSERUM 97 05/30/2019   IGMSERUM 82 04/04/2019     DIAGNOSTIC IMAGING:  I have independently reviewed the scans and discussed with the patient. No results found.   ASSESSMENT:  1. Acquired hypogammaglobulinemia: -She has been receiving IVIG every 8 weeks since January 2020. -She does have severely low IgA levels.  However he does not have any reactions.  2.  DLBCL, stage IVb: -She was diagnosed 07/08/2009.  Treated with 4 cycles of R-CHOP with intrathecal methotrexate during cycle 2 and 4.  She developed a Pseudomonas sepsis after cycles 2 and 4 and therapy was stopped after 4 cycles.  She also received 2 years of maintenance rituximab. -Last PET scan on 03/20/2017 did not show any evidence of lymphoma.  3.   Health maintenance: -Mammogram on 11/08/2018, BI-RADS Category 1.   PLAN:  1. Acquired hypogammaglobulinemia: -She denies any infections in the past 4 months. -I reviewed her labs.  IgG trough levels were 1200.  CBC and LFTs are grossly within normal limits. -She will continue IVIG every 8 weeks.  I plan to see her back in 4 months for follow-up.  2.  DLBCL, stage IVb: -No palpable adenopathy or B symptoms.  LDH was normal. -Continue to monitor by physical exam and labs.  Scans only if clinical condition dictates. 3.  Health maintenance: -She will have mammogram in November.   Orders placed this encounter:  No orders of the defined types were placed in this encounter.    Derek Jack, MD [place] [phone]   I, Milinda Antis, am acting as a scribe for Dr. Sanda Linger.  I, Derek Jack MD, have reviewed the above documentation for accuracy and completeness, and I agree with the above.

## 2019-08-13 NOTE — Progress Notes (Signed)
Patient has been assessed, vital signs and labs have been reviewed by Dr. Katragadda. ANC, Creatinine, LFTs, and Platelets are within treatment parameters per Dr. Katragadda. The patient is good to proceed with treatment at this time.  

## 2019-08-13 NOTE — Progress Notes (Signed)
Patient took her own premeds at home today.   Treatment given per orders. Patient tolerated it well without problems. Vitals stable and discharged home from clinic ambulatory. Follow up as scheduled.

## 2019-08-23 DIAGNOSIS — M9902 Segmental and somatic dysfunction of thoracic region: Secondary | ICD-10-CM | POA: Diagnosis not present

## 2019-08-23 DIAGNOSIS — M9901 Segmental and somatic dysfunction of cervical region: Secondary | ICD-10-CM | POA: Diagnosis not present

## 2019-08-23 DIAGNOSIS — M4004 Postural kyphosis, thoracic region: Secondary | ICD-10-CM | POA: Diagnosis not present

## 2019-08-23 DIAGNOSIS — M47812 Spondylosis without myelopathy or radiculopathy, cervical region: Secondary | ICD-10-CM | POA: Diagnosis not present

## 2019-08-28 DIAGNOSIS — M9901 Segmental and somatic dysfunction of cervical region: Secondary | ICD-10-CM | POA: Diagnosis not present

## 2019-08-28 DIAGNOSIS — M47812 Spondylosis without myelopathy or radiculopathy, cervical region: Secondary | ICD-10-CM | POA: Diagnosis not present

## 2019-08-28 DIAGNOSIS — M4004 Postural kyphosis, thoracic region: Secondary | ICD-10-CM | POA: Diagnosis not present

## 2019-08-28 DIAGNOSIS — M9902 Segmental and somatic dysfunction of thoracic region: Secondary | ICD-10-CM | POA: Diagnosis not present

## 2019-09-04 DIAGNOSIS — M9901 Segmental and somatic dysfunction of cervical region: Secondary | ICD-10-CM | POA: Diagnosis not present

## 2019-09-04 DIAGNOSIS — M4004 Postural kyphosis, thoracic region: Secondary | ICD-10-CM | POA: Diagnosis not present

## 2019-09-04 DIAGNOSIS — M9902 Segmental and somatic dysfunction of thoracic region: Secondary | ICD-10-CM | POA: Diagnosis not present

## 2019-09-04 DIAGNOSIS — M47812 Spondylosis without myelopathy or radiculopathy, cervical region: Secondary | ICD-10-CM | POA: Diagnosis not present

## 2019-09-11 DIAGNOSIS — M4004 Postural kyphosis, thoracic region: Secondary | ICD-10-CM | POA: Diagnosis not present

## 2019-09-11 DIAGNOSIS — M9901 Segmental and somatic dysfunction of cervical region: Secondary | ICD-10-CM | POA: Diagnosis not present

## 2019-09-11 DIAGNOSIS — M9902 Segmental and somatic dysfunction of thoracic region: Secondary | ICD-10-CM | POA: Diagnosis not present

## 2019-09-11 DIAGNOSIS — M47812 Spondylosis without myelopathy or radiculopathy, cervical region: Secondary | ICD-10-CM | POA: Diagnosis not present

## 2019-09-18 DIAGNOSIS — M9901 Segmental and somatic dysfunction of cervical region: Secondary | ICD-10-CM | POA: Diagnosis not present

## 2019-09-18 DIAGNOSIS — M4004 Postural kyphosis, thoracic region: Secondary | ICD-10-CM | POA: Diagnosis not present

## 2019-09-18 DIAGNOSIS — M9902 Segmental and somatic dysfunction of thoracic region: Secondary | ICD-10-CM | POA: Diagnosis not present

## 2019-09-18 DIAGNOSIS — M47812 Spondylosis without myelopathy or radiculopathy, cervical region: Secondary | ICD-10-CM | POA: Diagnosis not present

## 2019-10-02 DIAGNOSIS — M9901 Segmental and somatic dysfunction of cervical region: Secondary | ICD-10-CM | POA: Diagnosis not present

## 2019-10-02 DIAGNOSIS — M4004 Postural kyphosis, thoracic region: Secondary | ICD-10-CM | POA: Diagnosis not present

## 2019-10-02 DIAGNOSIS — M47812 Spondylosis without myelopathy or radiculopathy, cervical region: Secondary | ICD-10-CM | POA: Diagnosis not present

## 2019-10-02 DIAGNOSIS — M9902 Segmental and somatic dysfunction of thoracic region: Secondary | ICD-10-CM | POA: Diagnosis not present

## 2019-10-07 ENCOUNTER — Inpatient Hospital Stay (HOSPITAL_COMMUNITY): Payer: Medicare Other | Attending: Hematology

## 2019-10-07 ENCOUNTER — Other Ambulatory Visit: Payer: Self-pay

## 2019-10-07 DIAGNOSIS — D801 Nonfamilial hypogammaglobulinemia: Secondary | ICD-10-CM | POA: Insufficient documentation

## 2019-10-07 DIAGNOSIS — D649 Anemia, unspecified: Secondary | ICD-10-CM | POA: Diagnosis not present

## 2019-10-07 DIAGNOSIS — C8338 Diffuse large B-cell lymphoma, lymph nodes of multiple sites: Secondary | ICD-10-CM

## 2019-10-07 DIAGNOSIS — Z8572 Personal history of non-Hodgkin lymphomas: Secondary | ICD-10-CM | POA: Diagnosis not present

## 2019-10-07 LAB — COMPREHENSIVE METABOLIC PANEL
ALT: 11 U/L (ref 0–44)
AST: 15 U/L (ref 15–41)
Albumin: 3.5 g/dL (ref 3.5–5.0)
Alkaline Phosphatase: 89 U/L (ref 38–126)
Anion gap: 10 (ref 5–15)
BUN: 13 mg/dL (ref 8–23)
CO2: 30 mmol/L (ref 22–32)
Calcium: 8.8 mg/dL — ABNORMAL LOW (ref 8.9–10.3)
Chloride: 100 mmol/L (ref 98–111)
Creatinine, Ser: 0.94 mg/dL (ref 0.44–1.00)
GFR calc Af Amer: >60 mL/min (ref >60)
GFR calc non Af Amer: >60 mL/min (ref >60)
Glucose, Bld: 117 mg/dL — ABNORMAL HIGH (ref 70–99)
Potassium: 3.5 mmol/L (ref 3.5–5.1)
Sodium: 140 mmol/L (ref 135–145)
Total Bilirubin: 0.5 mg/dL (ref 0.3–1.2)
Total Protein: 6.3 g/dL — ABNORMAL LOW (ref 6.5–8.1)

## 2019-10-07 LAB — CBC WITH DIFFERENTIAL/PLATELET
Abs Immature Granulocytes: 0.04 10*3/uL (ref 0.00–0.07)
Basophils Absolute: 0 10*3/uL (ref 0.0–0.1)
Basophils Relative: 0 %
Eosinophils Absolute: 0.1 10*3/uL (ref 0.0–0.5)
Eosinophils Relative: 1 %
HCT: 36.5 % (ref 36.0–46.0)
Hemoglobin: 11.3 g/dL — ABNORMAL LOW (ref 12.0–15.0)
Immature Granulocytes: 0 %
Lymphocytes Relative: 14 %
Lymphs Abs: 1.4 10*3/uL (ref 0.7–4.0)
MCH: 29 pg (ref 26.0–34.0)
MCHC: 31 g/dL (ref 30.0–36.0)
MCV: 93.8 fL (ref 80.0–100.0)
Monocytes Absolute: 0.7 10*3/uL (ref 0.1–1.0)
Monocytes Relative: 7 %
Neutro Abs: 7.3 10*3/uL (ref 1.7–7.7)
Neutrophils Relative %: 78 %
Platelets: 187 10*3/uL (ref 150–400)
RBC: 3.89 MIL/uL (ref 3.87–5.11)
RDW: 13.9 % (ref 11.5–15.5)
WBC: 9.6 10*3/uL (ref 4.0–10.5)
nRBC: 0 % (ref 0.0–0.2)

## 2019-10-07 LAB — IRON AND TIBC
Iron: 57 ug/dL (ref 28–170)
Saturation Ratios: 22 % (ref 10.4–31.8)
TIBC: 261 ug/dL (ref 250–450)
UIBC: 204 ug/dL

## 2019-10-07 LAB — VITAMIN B12: Vitamin B-12: 1112 pg/mL — ABNORMAL HIGH (ref 180–914)

## 2019-10-07 LAB — FERRITIN: Ferritin: 17 ng/mL (ref 11–307)

## 2019-10-07 LAB — LACTATE DEHYDROGENASE: LDH: 154 U/L (ref 98–192)

## 2019-10-07 LAB — FOLATE: Folate: 9.2 ng/mL (ref >5.9)

## 2019-10-08 ENCOUNTER — Other Ambulatory Visit: Payer: Self-pay

## 2019-10-08 ENCOUNTER — Inpatient Hospital Stay (HOSPITAL_COMMUNITY): Payer: Medicare Other

## 2019-10-08 VITALS — BP 129/81 | HR 97 | Temp 97.8°F | Resp 16 | Wt 134.2 lb

## 2019-10-08 DIAGNOSIS — D649 Anemia, unspecified: Secondary | ICD-10-CM | POA: Diagnosis not present

## 2019-10-08 DIAGNOSIS — D801 Nonfamilial hypogammaglobulinemia: Secondary | ICD-10-CM

## 2019-10-08 DIAGNOSIS — Z8572 Personal history of non-Hodgkin lymphomas: Secondary | ICD-10-CM | POA: Diagnosis not present

## 2019-10-08 MED ORDER — INFLUENZA VAC A&B SA ADJ QUAD 0.5 ML IM PRSY: 0.5000 mL | PREFILLED_SYRINGE | Freq: Once | INTRAMUSCULAR | Status: DC

## 2019-10-08 MED ORDER — IMMUNE GLOBULIN (HUMAN) 10 GM/100ML IV SOLN
30.0000 g | Freq: Once | INTRAVENOUS | Status: AC
  Administered 2019-10-08: 30 g via INTRAVENOUS
  Filled 2019-10-08: qty 200

## 2019-10-08 MED ORDER — DEXTROSE 5 % IV SOLN: INTRAVENOUS | Status: DC

## 2019-10-08 NOTE — Progress Notes (Signed)
Patient presented today for IVIG. Patient has taken her pre-meds at home. Proceed as planned.    Treatment given per orders. Patient tolerated it well without problems. Vitals stable and discharged home from clinic ambulatory in stable condition. Follow up as scheduled.

## 2019-10-08 NOTE — Patient Instructions (Signed)
[facility] at [facility]  Discharge Instructions:   _______________________________________________________________  Thank you for choosing [facility] at [facility] to provide your oncology and hematology care.  To afford each patient quality time with our providers, please arrive at least 15 minutes before your scheduled appointment.  You need to re-schedule your appointment if you arrive 10 or more minutes late.  We strive to give you quality time with our providers, and arriving late affects you and other patients whose appointments are after yours.  Also, if you no show three or more times for appointments you may be dismissed from the clinic.  Again, thank you for choosing [facility] at [facility]. Our hope is that these requests will allow you access to exceptional care and in a timely manner. _______________________________________________________________  If you have questions after your visit, please contact our office at [phone] between the hours of 8:30 a.m. and 5:00 p.m. Voicemails left after 4:30 p.m. will not be returned until the following business day. _______________________________________________________________  For prescription refill requests, have your pharmacy contact our office. _______________________________________________________________  Recommendations made by the consultant and any test results will be sent to your referring physician. _______________________________________________________________ 

## 2019-10-15 DIAGNOSIS — M47812 Spondylosis without myelopathy or radiculopathy, cervical region: Secondary | ICD-10-CM | POA: Diagnosis not present

## 2019-10-15 DIAGNOSIS — M4004 Postural kyphosis, thoracic region: Secondary | ICD-10-CM | POA: Diagnosis not present

## 2019-10-15 DIAGNOSIS — M9901 Segmental and somatic dysfunction of cervical region: Secondary | ICD-10-CM | POA: Diagnosis not present

## 2019-10-15 DIAGNOSIS — M9902 Segmental and somatic dysfunction of thoracic region: Secondary | ICD-10-CM | POA: Diagnosis not present

## 2019-10-16 IMAGING — CT CT ABD-PELV W/ CM
2 of 5 series · 17 of 46 positions shown, 19 images · IV contrast (Isovue)
Comparison: CT 11/26/2014, PET-CT 06/24/2013

CLINICAL DATA: Left lower quadrant pain

EXAM:
CT ABDOMEN AND PELVIS WITH CONTRAST
TECHNIQUE: Multidetector CT imaging of the abdomen and pelvis was performed
using the standard protocol following bolus administration of
intravenous contrast.
CONTRAST:  100mL OMNIPAQUE IOHEXOL 300 MG/ML  SOLN

[Series 2: axial st · axial · 0.74mm/px · z∈[+986,+1336]mm · 14 of 80 slices shown, 16 images]
[im 5/80  soft-tissue]
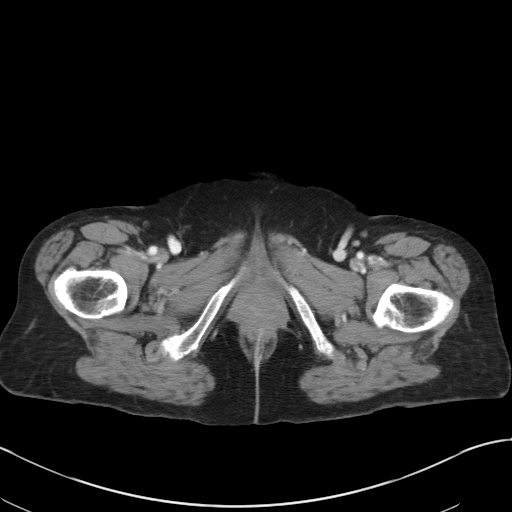
[im 5/80  bone]
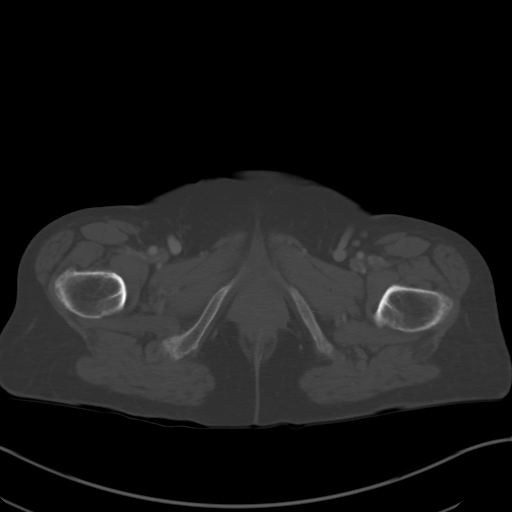
[im 10/80  soft-tissue]
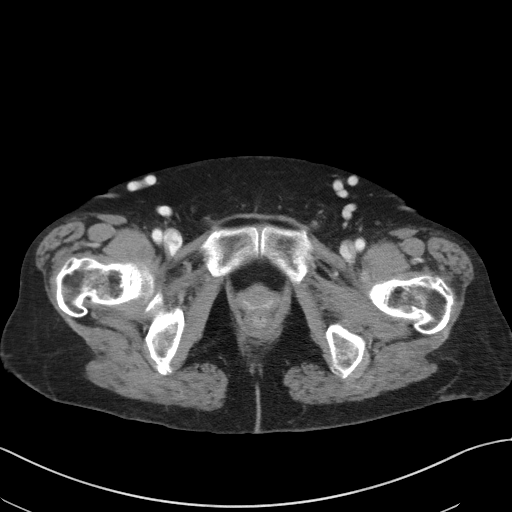
[im 14/80  soft-tissue]
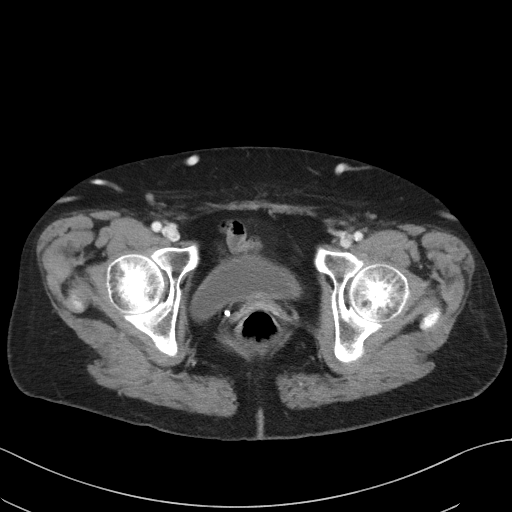
[im 24/80  soft-tissue]
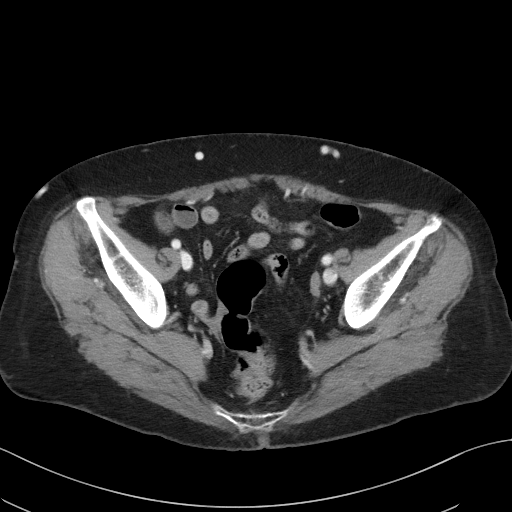
[im 28/80  soft-tissue]
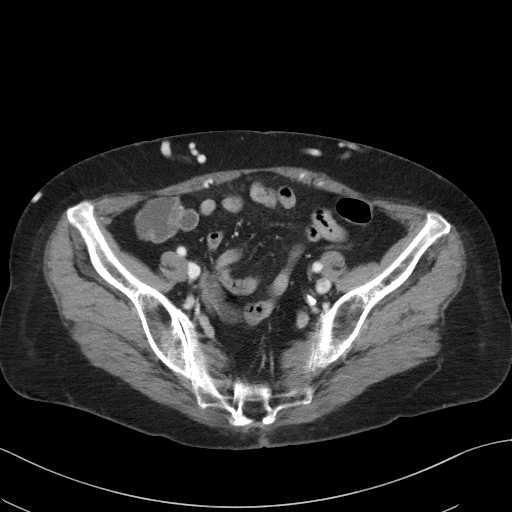
[im 33/80  soft-tissue]
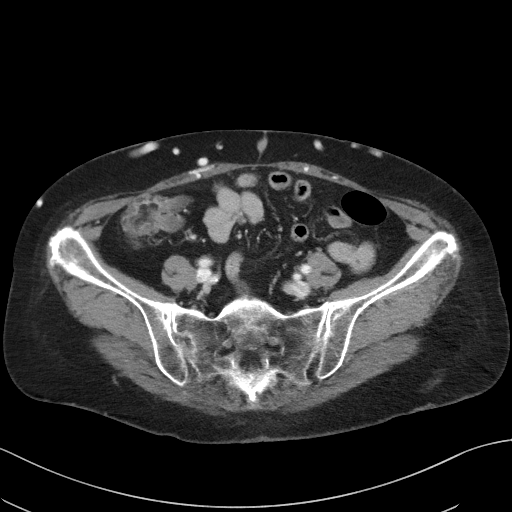
[im 38/80  soft-tissue]
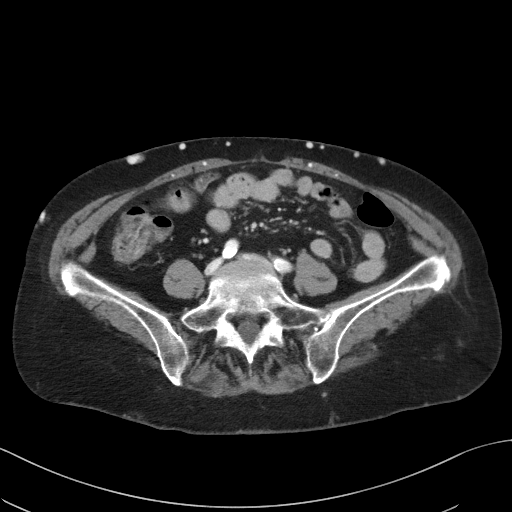
[im 42/80  soft-tissue]
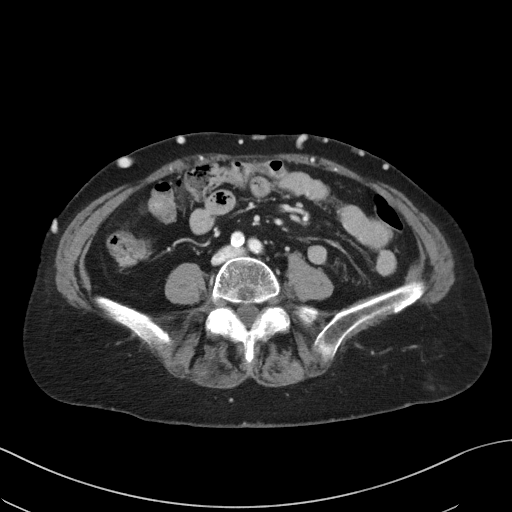
[im 47/80  soft-tissue]
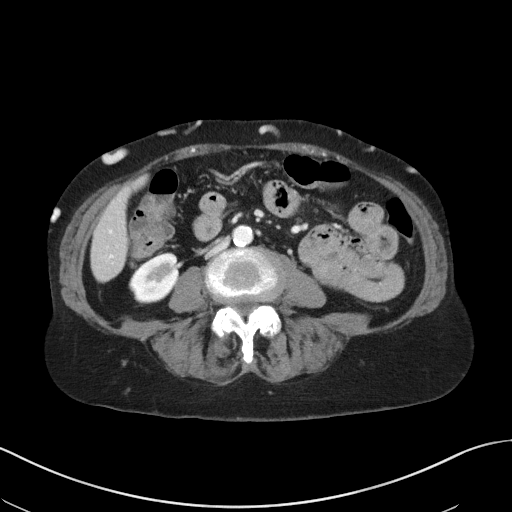
[im 47/80  bone]
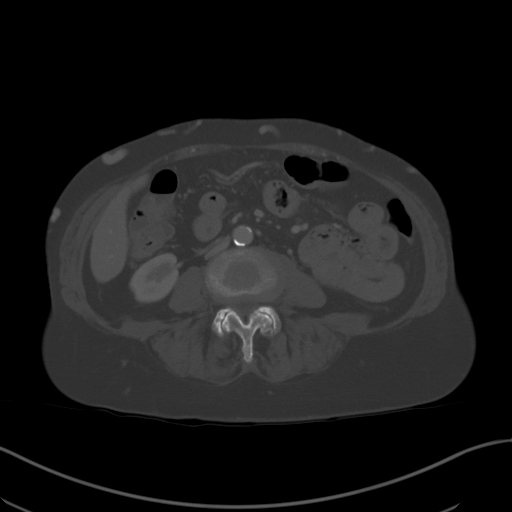
[im 52/80  soft-tissue]
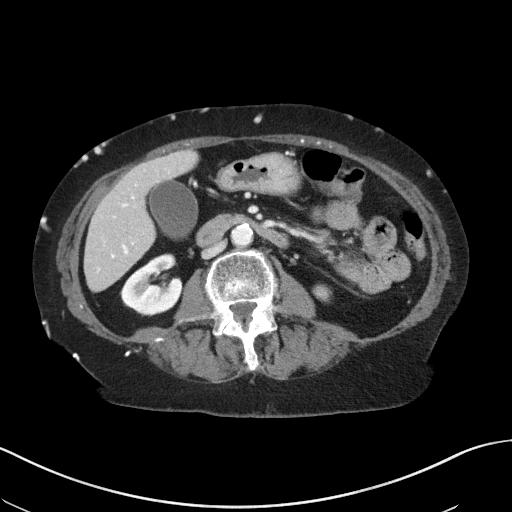
[im 61/80  soft-tissue]
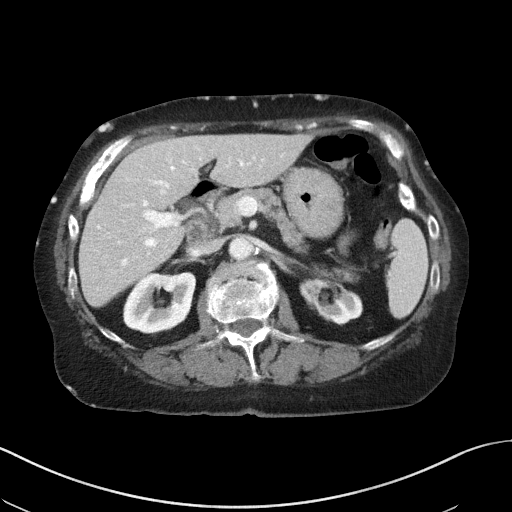
[im 66/80  soft-tissue]
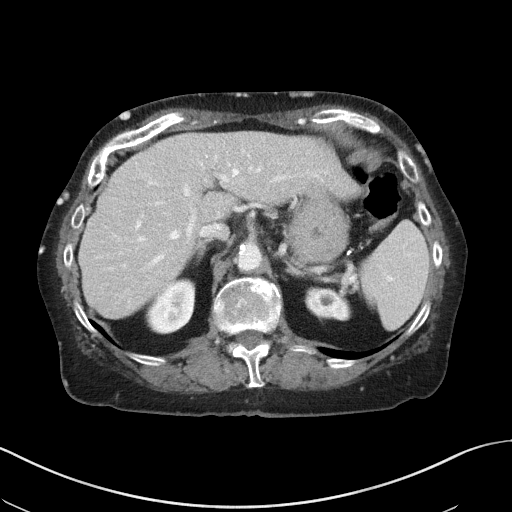
[im 70/80  soft-tissue]
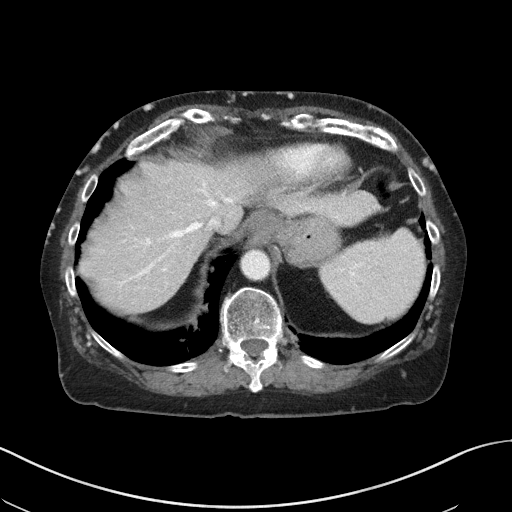
[im 75/80  soft-tissue]
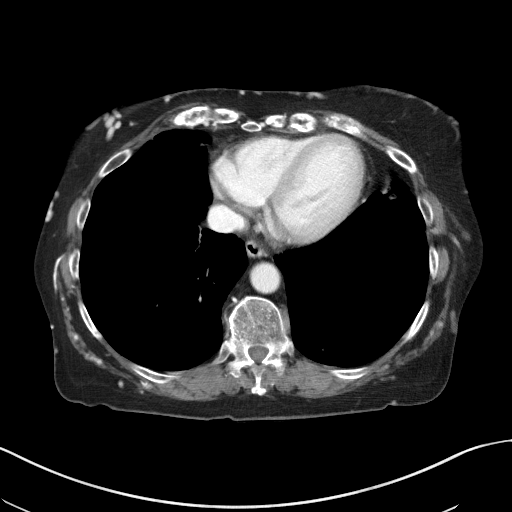

[Series 5: coronal st · coronal · 0.77mm/px · 3 of 95 slices shown]
[im 32/95  soft-tissue]
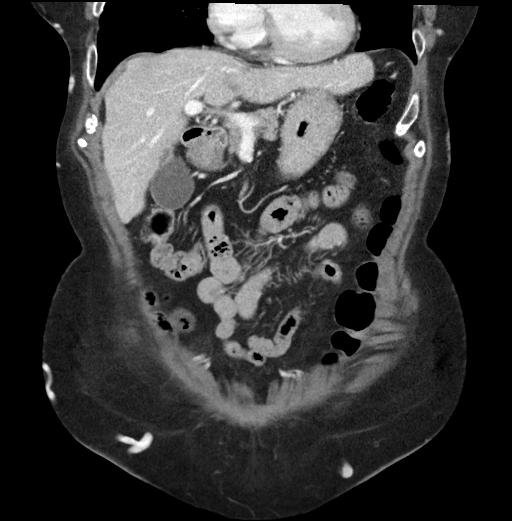
[im 42/95  soft-tissue]
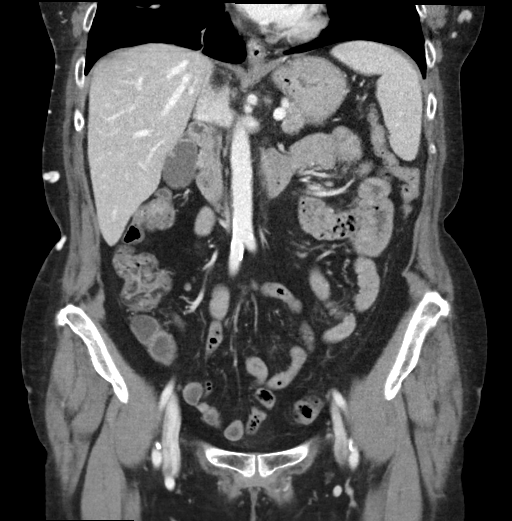
[im 53/95  soft-tissue]
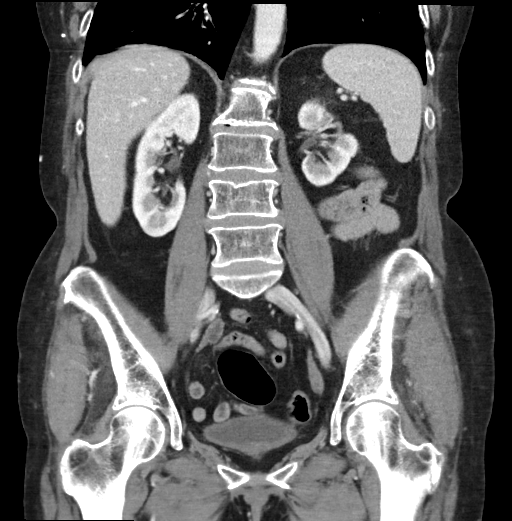

[17 of 46 positions shown; findings below may reference images not displayed]

FINDINGS: Lower chest: Lung bases demonstrate minimal bronchiectasis in the
lingula with mild scarring at the right middle lobe and lingula. No
acute consolidation or effusion. The heart size is normal.

Hepatobiliary: No calcified gallstone. Stable hypodense lesions
within the right hepatic lobe. No biliary dilatation

Pancreas: Unremarkable. No pancreatic ductal dilatation or
surrounding inflammatory changes.

Spleen: Subcentimeter circumscribed hypodensity within the anterior
spleen measuring 5 mm, not seen on prior studies.

Adrenals/Urinary Tract: Adrenal glands are normal. No
hydronephrosis. Atrophic left kidney with cortical scarring. Bladder
is normal

Stomach/Bowel: Stomach is nonenlarged. No dilated small bowel. No
colon wall thickening.

Vascular/Lymphatic: Nonaneurysmal aorta. Moderate aortic
atherosclerosis. No significantly enlarged lymph nodes. Numerous
chest and abdominal wall collateral vessels, chronic.

Reproductive: Status post hysterectomy. No adnexal masses.

Other: Negative for free air or free fluid.

Musculoskeletal: No acute or suspicious abnormality.
IMPRESSION: 1. No CT evidence for acute intra-abdominal or pelvic abnormality.
2. New 5 mm probably benign hypodensity within the anterior spleen.
Suggest 6-12 month MRI follow-up to document stability.
3. Atrophic left kidney with cortical scarring.

## 2019-10-29 DIAGNOSIS — M47812 Spondylosis without myelopathy or radiculopathy, cervical region: Secondary | ICD-10-CM | POA: Diagnosis not present

## 2019-10-29 DIAGNOSIS — M4004 Postural kyphosis, thoracic region: Secondary | ICD-10-CM | POA: Diagnosis not present

## 2019-10-29 DIAGNOSIS — M9902 Segmental and somatic dysfunction of thoracic region: Secondary | ICD-10-CM | POA: Diagnosis not present

## 2019-10-29 DIAGNOSIS — M9901 Segmental and somatic dysfunction of cervical region: Secondary | ICD-10-CM | POA: Diagnosis not present

## 2019-11-08 DIAGNOSIS — M9902 Segmental and somatic dysfunction of thoracic region: Secondary | ICD-10-CM | POA: Diagnosis not present

## 2019-11-08 DIAGNOSIS — M47812 Spondylosis without myelopathy or radiculopathy, cervical region: Secondary | ICD-10-CM | POA: Diagnosis not present

## 2019-11-08 DIAGNOSIS — M4004 Postural kyphosis, thoracic region: Secondary | ICD-10-CM | POA: Diagnosis not present

## 2019-11-08 DIAGNOSIS — M9901 Segmental and somatic dysfunction of cervical region: Secondary | ICD-10-CM | POA: Diagnosis not present

## 2019-11-18 ENCOUNTER — Other Ambulatory Visit (HOSPITAL_COMMUNITY): Payer: Self-pay | Admitting: Family Medicine

## 2019-11-18 DIAGNOSIS — Z1231 Encounter for screening mammogram for malignant neoplasm of breast: Secondary | ICD-10-CM

## 2019-11-22 DIAGNOSIS — M47812 Spondylosis without myelopathy or radiculopathy, cervical region: Secondary | ICD-10-CM | POA: Diagnosis not present

## 2019-11-22 DIAGNOSIS — M9901 Segmental and somatic dysfunction of cervical region: Secondary | ICD-10-CM | POA: Diagnosis not present

## 2019-11-22 DIAGNOSIS — M9902 Segmental and somatic dysfunction of thoracic region: Secondary | ICD-10-CM | POA: Diagnosis not present

## 2019-11-22 DIAGNOSIS — M4004 Postural kyphosis, thoracic region: Secondary | ICD-10-CM | POA: Diagnosis not present

## 2019-11-26 DIAGNOSIS — Z0000 Encounter for general adult medical examination without abnormal findings: Secondary | ICD-10-CM | POA: Diagnosis not present

## 2019-11-26 DIAGNOSIS — Z6824 Body mass index (BMI) 24.0-24.9, adult: Secondary | ICD-10-CM | POA: Diagnosis not present

## 2019-11-26 DIAGNOSIS — F419 Anxiety disorder, unspecified: Secondary | ICD-10-CM | POA: Diagnosis not present

## 2019-11-26 DIAGNOSIS — C8580 Other specified types of non-Hodgkin lymphoma, unspecified site: Secondary | ICD-10-CM | POA: Diagnosis not present

## 2019-11-26 DIAGNOSIS — Z1389 Encounter for screening for other disorder: Secondary | ICD-10-CM | POA: Diagnosis not present

## 2019-11-26 DIAGNOSIS — F909 Attention-deficit hyperactivity disorder, unspecified type: Secondary | ICD-10-CM | POA: Diagnosis not present

## 2019-11-26 DIAGNOSIS — Z23 Encounter for immunization: Secondary | ICD-10-CM | POA: Diagnosis not present

## 2019-12-02 ENCOUNTER — Other Ambulatory Visit: Payer: Self-pay

## 2019-12-02 ENCOUNTER — Inpatient Hospital Stay (HOSPITAL_COMMUNITY): Payer: Medicare Other | Attending: Hematology

## 2019-12-02 DIAGNOSIS — Z8572 Personal history of non-Hodgkin lymphomas: Secondary | ICD-10-CM | POA: Insufficient documentation

## 2019-12-02 DIAGNOSIS — D801 Nonfamilial hypogammaglobulinemia: Secondary | ICD-10-CM | POA: Diagnosis present

## 2019-12-02 DIAGNOSIS — D649 Anemia, unspecified: Secondary | ICD-10-CM | POA: Diagnosis not present

## 2019-12-02 DIAGNOSIS — C8338 Diffuse large B-cell lymphoma, lymph nodes of multiple sites: Secondary | ICD-10-CM

## 2019-12-02 LAB — CBC WITH DIFFERENTIAL/PLATELET
Abs Immature Granulocytes: 0.03 10*3/uL (ref 0.00–0.07)
Basophils Absolute: 0 10*3/uL (ref 0.0–0.1)
Basophils Relative: 0 %
Eosinophils Absolute: 0.2 10*3/uL (ref 0.0–0.5)
Eosinophils Relative: 2 %
HCT: 35 % — ABNORMAL LOW (ref 36.0–46.0)
Hemoglobin: 10.6 g/dL — ABNORMAL LOW (ref 12.0–15.0)
Immature Granulocytes: 0 %
Lymphocytes Relative: 19 %
Lymphs Abs: 1.8 10*3/uL (ref 0.7–4.0)
MCH: 29 pg (ref 26.0–34.0)
MCHC: 30.3 g/dL (ref 30.0–36.0)
MCV: 95.6 fL (ref 80.0–100.0)
Monocytes Absolute: 0.6 10*3/uL (ref 0.1–1.0)
Monocytes Relative: 7 %
Neutro Abs: 7 10*3/uL (ref 1.7–7.7)
Neutrophils Relative %: 72 %
Platelets: 196 10*3/uL (ref 150–400)
RBC: 3.66 MIL/uL — ABNORMAL LOW (ref 3.87–5.11)
RDW: 13.5 % (ref 11.5–15.5)
WBC: 9.6 10*3/uL (ref 4.0–10.5)
nRBC: 0 % (ref 0.0–0.2)

## 2019-12-02 LAB — COMPREHENSIVE METABOLIC PANEL
ALT: 12 U/L (ref 0–44)
AST: 15 U/L (ref 15–41)
Albumin: 3.2 g/dL — ABNORMAL LOW (ref 3.5–5.0)
Alkaline Phosphatase: 83 U/L (ref 38–126)
Anion gap: 9 (ref 5–15)
BUN: 11 mg/dL (ref 8–23)
CO2: 26 mmol/L (ref 22–32)
Calcium: 8.5 mg/dL — ABNORMAL LOW (ref 8.9–10.3)
Chloride: 103 mmol/L (ref 98–111)
Creatinine, Ser: 0.9 mg/dL (ref 0.44–1.00)
GFR, Estimated: >60 mL/min (ref >60)
Glucose, Bld: 108 mg/dL — ABNORMAL HIGH (ref 70–99)
Potassium: 3.2 mmol/L — ABNORMAL LOW (ref 3.5–5.1)
Sodium: 138 mmol/L (ref 135–145)
Total Bilirubin: 0.5 mg/dL (ref 0.3–1.2)
Total Protein: 6.7 g/dL (ref 6.5–8.1)

## 2019-12-02 LAB — LACTATE DEHYDROGENASE: LDH: 155 U/L (ref 98–192)

## 2019-12-03 ENCOUNTER — Inpatient Hospital Stay (HOSPITAL_BASED_OUTPATIENT_CLINIC_OR_DEPARTMENT_OTHER): Payer: Medicare Other | Admitting: Hematology

## 2019-12-03 ENCOUNTER — Encounter (HOSPITAL_COMMUNITY): Payer: Self-pay | Admitting: Hematology

## 2019-12-03 ENCOUNTER — Inpatient Hospital Stay (HOSPITAL_COMMUNITY): Payer: Medicare Other

## 2019-12-03 VITALS — BP 143/54 | HR 77 | Temp 96.9°F | Resp 18 | Wt 139.2 lb

## 2019-12-03 VITALS — BP 147/79 | HR 65 | Temp 97.3°F | Resp 18

## 2019-12-03 DIAGNOSIS — D801 Nonfamilial hypogammaglobulinemia: Secondary | ICD-10-CM | POA: Diagnosis not present

## 2019-12-03 DIAGNOSIS — D649 Anemia, unspecified: Secondary | ICD-10-CM | POA: Diagnosis not present

## 2019-12-03 DIAGNOSIS — C8338 Diffuse large B-cell lymphoma, lymph nodes of multiple sites: Secondary | ICD-10-CM

## 2019-12-03 DIAGNOSIS — Z8572 Personal history of non-Hodgkin lymphomas: Secondary | ICD-10-CM | POA: Diagnosis not present

## 2019-12-03 LAB — IGG, IGA, IGM
IgA: 6 mg/dL — ABNORMAL LOW (ref 87–352)
IgG (Immunoglobin G), Serum: 1250 mg/dL (ref 586–1602)
IgM (Immunoglobulin M), Srm: 111 mg/dL (ref 26–217)

## 2019-12-03 MED ORDER — DIPHENHYDRAMINE HCL 25 MG PO CAPS: 25.0000 mg | ORAL_CAPSULE | Freq: Once | ORAL | Status: DC

## 2019-12-03 MED ORDER — IMMUNE GLOBULIN (HUMAN) 10 GM/100ML IV SOLN
30.0000 g | Freq: Once | INTRAVENOUS | Status: AC
  Administered 2019-12-03: 30 g via INTRAVENOUS
  Filled 2019-12-03: qty 100

## 2019-12-03 MED ORDER — DEXTROSE 5 % IV SOLN: INTRAVENOUS | Status: DC

## 2019-12-03 MED ORDER — ACETAMINOPHEN 325 MG PO TABS: 650.0000 mg | ORAL_TABLET | Freq: Four times a day (QID) | ORAL | Status: DC | PRN

## 2019-12-03 NOTE — Progress Notes (Signed)
Patient seen by Dr. Delton Coombes. Message received from [place] RN/ Dr. Delton Coombes to proceed with IVIG. Per [place] RN put patient on the schedule for 2 Feraheme infusions. Scheduling notified.   IVIG administered today per MD orders. Tolerated infusion without adverse affects. Vital signs stable. No complaints at this time. Discharged from clinic ambulatory in stable condition. Alert and oriented x 3. F/U with [facility] as scheduled.

## 2019-12-03 NOTE — Patient Instructions (Signed)
[facility] at [facility]  Discharge Instructions:   _______________________________________________________________  Thank you for choosing [facility] at [facility] to provide your oncology and hematology care.  To afford each patient quality time with our providers, please arrive at least 15 minutes before your scheduled appointment.  You need to re-schedule your appointment if you arrive 10 or more minutes late.  We strive to give you quality time with our providers, and arriving late affects you and other patients whose appointments are after yours.  Also, if you no show three or more times for appointments you may be dismissed from the clinic.  Again, thank you for choosing [facility] at [facility]. Our hope is that these requests will allow you access to exceptional care and in a timely manner. _______________________________________________________________  If you have questions after your visit, please contact our office at [phone] between the hours of 8:30 a.m. and 5:00 p.m. Voicemails left after 4:30 p.m. will not be returned until the following business day. _______________________________________________________________  For prescription refill requests, have your pharmacy contact our office. _______________________________________________________________  Recommendations made by the consultant and any test results will be sent to your referring physician. _______________________________________________________________ 

## 2019-12-03 NOTE — Progress Notes (Signed)
[place], [place] [address]   CLINIC:  Medical Oncology/Hematology  PCP:  [name], [address] [place] / [place] [address]  [phone]  REASON FOR VISIT:  Follow-up for acquired hypogammaglobulinemia and DLBCL  PRIOR THERAPY: R-CHOP x 4 cycles with intrathecal methotrexate during cycles 2 & 4.  CURRENT THERAPY: IVIG every 8 weeks  INTERVAL HISTORY:  [name], a 64 y.o. female, returns for routine follow-up for her acquired hypogammaglobulinemia and DLBCL. [name] was last seen on 08/13/2019.  Today she reports feeling okay. She reports having low grade fevers and itching when her lymphoma was first diagnosed. She denies having any lymphoma symptoms currently. Her appetite is decreased to 50%. She complains of greenish rhinorrhea which happens often; she will go to the doctor to get antibiotics if the rhinorrhea becomes unbearable. She denies having nosebleeds, hematochezia or hematuria. She does not currently take iron tablets and reports that she felt nauseous when she took iron tablets before, but tolerated the iron infusion well.  She is scheduled for her mammogram on 12/9.   REVIEW OF SYSTEMS:  Review of Systems  Constitutional: Positive for appetite change (50%) and fatigue (50%). Negative for fever.  HENT:   Positive for trouble swallowing. Negative for nosebleeds.        Rhinorrhea w/ greenish sputum  Respiratory: Positive for cough and shortness of breath.   Gastrointestinal: Negative for blood in stool.  Genitourinary: Negative for hematuria.   Skin: Negative for itching.  Neurological: Positive for dizziness.  All other systems reviewed and are negative.   PAST MEDICAL/SURGICAL HISTORY:  Past Medical History:  Diagnosis Date  . Allergic rhinitis   . Allergy   . Anemia   . Anxiety   . Asthma   . B12 deficiency 01/18/2015  . Blood transfusion without reported diagnosis   . Cataract   . Cavitary lung  disease   . Chronic kidney disease    related to cancer  . Chronic sinusitis   . Clotting disorder ([facility])   . COPD (chronic obstructive pulmonary disease) ([facility])   . Depression   . DVT (deep venous thrombosis) ([facility]) 09/25/2014  . Endotracheally intubated   . GERD (gastroesophageal reflux disease)   . Hematuria 04/07/2016  . Hyperlipidemia   . Hypertension   . Hypogammaglobulinemia, acquired ([facility]) 12/30/2009   Qualifier: Diagnosis of  By: Joya Gaskins MD, Burnett Harry   . Iron deficiency anemia 01/18/2015  . Myocardial infarction ([facility]) 2011  . 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 Non Hodgkin's lymphoma ([facility]) 2011  . Orofacial dyskinesia 04/22/2015  . Orofacial dyskinesia 04/22/2015  . Osteoporosis   . Peripheral neuropathy due to chemotherapy ([facility]) 05/02/2016  . Pneumonia 01/2013  . PONV (postoperative nausea and vomiting)   . Respiratory failure ([facility])   . Sleep apnea   . SVC syndrome 09/25/2014   Past Surgical History:  Procedure Laterality Date  . ABDOMINAL HYSTERECTOMY     Fibroids  . BASAL CELL CARCINOMA EXCISION  03/2011   scalp  . BREAST SURGERY Right    biopsy  . CATARACT EXTRACTION W/PHACO Left 11/17/2014   Procedure: CATARACT EXTRACTION PHACO AND INTRAOCULAR LENS PLACEMENT LEFT EYE;  Surgeon: Tonny Branch, MD;  Location: [facility];  Service: Ophthalmology;  Laterality: Left;  CDE:5.60  . CATARACT EXTRACTION W/PHACO Right 12/15/2014   Procedure: CATARACT EXTRACTION PHACO AND INTRAOCULAR LENS PLACEMENT RIGHT EYE CDE=5.16;  Surgeon: Tonny Branch, MD;  Location: [facility];  Service: Ophthalmology;  Laterality: Right;  . COLONOSCOPY N/A 11/14/2012   Procedure: COLONOSCOPY;  Surgeon: Rogene Houston, MD;  Location: [facility];  Service: Endoscopy;  Laterality: N/A;  830  . COLONOSCOPY WITH PROPOFOL N/A 01/12/2018   Procedure: COLONOSCOPY WITH PROPOFOL;  Surgeon: Rogene [place], MD;  Location: [facility];  Service: Endoscopy;  Laterality: N/A;  7:30  . EYE SURGERY    . NASAL  SINUS SURGERY    . NECK SURGERY     x 2   . PERIPHERALLY INSERTED CENTRAL CATHETER INSERTION    . PICC Removal    . POLYPECTOMY  01/12/2018   Procedure: POLYPECTOMY;  Surgeon: Rogene Houston, MD;  Location: [facility];  Service: Endoscopy;;  colon  . PORT-A-CATH REMOVAL    . PORTACATH PLACEMENT  7/11   Removed 6/12  . TRACHEOSTOMY    . VESICOVAGINAL FISTULA CLOSURE W/ TAH      SOCIAL HISTORY:  Social History   Socioeconomic History  . Marital status: Divorced    Spouse name: Not on file  . Number of children: 3  . Years of education: 25  . Highest education level: Not on file  Occupational History  . Occupation: Estate agent    Comment: [employer]  . Smoking status: Never Smoker  . Smokeless tobacco: Never Used  Vaping Use  . Vaping Use: Never used  Substance and Sexual Activity  . Alcohol use: No  . Drug use: No  . Sexual activity: Not Currently    Birth control/protection: Surgical    Comment: hyst  Other Topics Concern  . Not on file  Social History Narrative   Originally from [place]. Always lived in [place]. Prior travel to [place]. 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 She has 1 dog currently. She has 1 conures (small parrots). No mold exposure in her home. 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 No hot tub exposure. She enjoys sewing & quilting.    Social Determinants of Health   Financial Resource Strain: Medium Risk  . Difficulty of Paying Living Expenses: Somewhat hard  Food Insecurity: No Food Insecurity  . Worried About Charity fundraiser in the Last Year: Never true  . Ran Out of Food in the Last Year: Never true  Transportation Needs: No Transportation Needs  . Lack of Transportation (Medical): No  . Lack of Transportation (Non-Medical): No  Physical Activity: Insufficiently Active  . Days of Exercise per Week: 2 days  . Minutes of Exercise per Session: 10 min   Stress: No Stress Concern Present  . Feeling of Stress : Not at all  Social Connections: Moderately Integrated  . Frequency of Communication with Friends and Family: More than three times a week  . Frequency of Social Gatherings with Friends and Family: More than three times a week  . Attends Religious Services: More than 4 times per year  . Active Member of Clubs or Organizations: Yes  . Attends Archivist Meetings: More than 4 times per year  . Marital Status: Divorced  Human resources officer Violence: Not At Risk  . Fear of Current or Ex-Partner: No  . Emotionally Abused: No  . Physically Abused: No  . Sexually Abused: No    FAMILY HISTORY:  Family History  Problem Relation Age of Onset  . Emphysema Mother   . Stroke Mother   . COPD Mother   .  Heart disease Mother        died in sleep  8  . Allergies Father   . Asthma Father        as a child  . Arthritis Father   . Parkinson's disease Father 3  . Leukemia Maternal Grandmother   . Cancer Maternal Grandmother        Leukemia  . Diabetes Brother   . Heart attack Brother   . Hypertension Brother   . Heart disease Brother 40       stents  . Hypertension Sister   . Hypertension Brother     CURRENT MEDICATIONS:  Current Outpatient Medications  Medication Sig Dispense Refill  . acetaminophen (TYLENOL) 500 MG tablet Take 1,000 mg by mouth every 6 (six) hours as needed for moderate pain or headache.     [name] Kitchen acyclovir (ZOVIRAX) 400 MG tablet Take 1 tablet (400 mg total) by mouth 2 (two) times daily. 60 tablet 3  . albuterol (PROAIR HFA) 108 (90 Base) MCG/ACT inhaler Inhale 2 puffs into the lungs every 6 (six) hours as needed. (Patient taking differently: Inhale 2 puffs into the lungs every 6 (six) hours as needed for wheezing or shortness of breath. ) 8.5 g 2  . albuterol (PROVENTIL) (2.5 MG/3ML) 0.083% nebulizer solution Take 2.5 mg by nebulization every 4 (four) hours as needed for wheezing or shortness of breath.     . amphetamine-dextroamphetamine (ADDERALL) 10 MG tablet Take 10 mg by mouth every morning.   0  . Armodafinil (NUVIGIL) 150 MG tablet Take 150 mg by mouth every morning.     . budesonide-formoterol (SYMBICORT) 160-4.5 MCG/ACT inhaler Inhale 2 puffs into the lungs 2 (two) times daily. 1 Inhaler 5  . Cholecalciferol (VITAMIN D3 PO) Take 1 capsule by mouth at bedtime.    . citalopram (CELEXA) 20 MG tablet Take 20 mg by mouth every morning.      . cyanocobalamin 1000 MCG tablet Take 1,000 mcg by mouth daily.    . furosemide (LASIX) 20 MG tablet Take 1 tablet (20 mg total) by mouth as needed for fluid. 90 tablet 1  . gabapentin (NEURONTIN) 600 MG tablet 300 mg 2 (two) times daily.     . mupirocin ointment (BACTROBAN) 2 % Apply 1 application topically 3 (three) times daily.    [name] Kitchen olmesartan (BENICAR) 40 MG tablet Take 20-40 mg by mouth daily.    [name] Kitchen omeprazole (PRILOSEC) 20 MG capsule Take 1 capsule (20 mg total) by mouth 2 (two) times daily. 180 capsule 2  . predniSONE (DELTASONE) 5 MG tablet TAKE 1 TABLET BY MOUTH ONCE A DAY. 30 tablet 0  . simvastatin (ZOCOR) 80 MG tablet Take 1 tablet (80 mg total) by mouth at bedtime. 90 tablet 3  . zafirlukast (ACCOLATE) 20 MG tablet TAKE 1 TABLET BY MOUTH TWICE DAILY. 60 tablet 0   No current facility-administered medications for this visit.   Facility-Administered Medications Ordered in Other Visits  Medication Dose Route Frequency Provider Last Rate Last Admin  . 0.9 %  sodium chloride infusion   Intravenous Once Kefalas, Thomas S, PA-C      . 0.9 %  sodium chloride infusion   Intravenous Once Kefalas, Thomas S, PA-C      . acetaminophen (TYLENOL) tablet 650 mg  650 mg Oral Q6H PRN Baird Cancer, PA-C      . acetaminophen (TYLENOL) tablet 650 mg  650 mg Oral Q6H PRN Baird Cancer, PA-C      .  acetaminophen (TYLENOL) tablet 650 mg  650 mg Oral Q6H PRN Derek Jack, MD      . dextrose 5 % solution   Intravenous Continuous Baird Cancer,  PA-C   Stopped at 08/25/15 1400  . dextrose 5 % solution   Intravenous Continuous Baird Cancer, PA-C   Stopped at 07/13/16 1350  . dextrose 5 % solution   Intravenous Continuous Holley Bouche, NP   Stopped at 02/28/17 1230  . dextrose 5 % solution   Intravenous Continuous Derek Jack, MD 10 mL/hr at 12/03/19 0847 New Bag at 12/03/19 0847  . diphenhydrAMINE (BENADRYL) capsule 25 mg  25 mg Oral Once Kefalas, Thomas S, PA-C      . diphenhydrAMINE (BENADRYL) capsule 25 mg  25 mg Oral Once Kefalas, Thomas S, PA-C      . diphenhydrAMINE (BENADRYL) capsule 25 mg  25 mg Oral Once Derek Jack, MD      . Immune Globulin 10% (PRIVIGEN) IV infusion 30 g  30 g Intravenous Once Derek Jack, MD      . sodium chloride 0.9 % injection 10 mL  10 mL Intracatheter PRN Baird Cancer, PA-C   10 mL at 08/25/15 1125    ALLERGIES:  Allergies  Allergen Reactions  . Meperidine Hcl Anaphylaxis  . Montelukast Sodium Hives and Rash    PHYSICAL EXAM:  Performance status (ECOG): 1 - Symptomatic but completely ambulatory  Vitals:   12/03/19 0807  BP: (!) 143/54  Pulse: 77  Resp: 18  Temp: (!) 96.9 F (36.1 C)  SpO2: 98%   Wt Readings from Last 3 Encounters:  12/03/19 139 lb 3.2 oz (63.1 kg)  10/08/19 134 lb 3.2 oz (60.9 kg)  08/13/19 135 lb 6.4 oz (61.4 kg)   Physical Exam Vitals reviewed.  Constitutional:      Appearance: Normal appearance.  HENT:     Nose:     Right Sinus: No maxillary sinus tenderness or frontal sinus tenderness.     Left Sinus: No maxillary sinus tenderness or frontal sinus tenderness.  Cardiovascular:     Rate and Rhythm: Normal rate and regular rhythm.     Pulses: Normal pulses.     Heart sounds: Normal heart sounds.  Pulmonary:     Effort: Pulmonary effort is normal.     Breath sounds: Normal breath sounds.  Neurological:     General: No focal deficit present.     Mental Status: She is alert and oriented to person, place, and time.   Psychiatric:        Mood and Affect: Mood normal.        Behavior: Behavior normal.     LABORATORY DATA:  I have reviewed the labs as listed.  CBC Latest Ref Rng & Units 12/02/2019 10/07/2019 08/12/2019  WBC 4.0 - 10.5 K/uL 9.6 9.6 5.9  Hemoglobin 12.0 - 15.0 g/dL 10.6(L) 11.3(L) 11.0(L)  Hematocrit 36 - 46 % 35.0(L) 36.5 35.8(L)  Platelets 150 - 400 K/uL 196 187 175   CMP Latest Ref Rng & Units 12/02/2019 10/07/2019 08/12/2019  Glucose 70 - 99 mg/dL 108(H) 117(H) 91  BUN 8 - 23 mg/dL 11 13 14   Creatinine 0.44 - 1.00 mg/dL 0.90 0.94 0.98  Sodium 135 - 145 mmol/L 138 140 140  Potassium 3.5 - 5.1 mmol/L 3.2(L) 3.5 3.7  Chloride 98 - 111 mmol/L 103 100 102  CO2 22 - 32 mmol/L 26 30 28   Calcium 8.9 - 10.3 mg/dL 8.5(L) 8.8(L) 8.7(L)  Total  Protein 6.5 - 8.1 g/dL 6.7 6.3(L) 6.5  Total Bilirubin 0.3 - 1.2 mg/dL 0.5 0.5 0.6  Alkaline Phos 38 - 126 U/L 83 89 88  AST 15 - 41 U/L 15 15 16   ALT 0 - 44 U/L 12 11 13       Component Value Date/Time   RBC 3.66 (L) 12/02/2019 1010   MCV 95.6 12/02/2019 1010   MCH 29.0 12/02/2019 1010   MCHC 30.3 12/02/2019 1010   RDW 13.5 12/02/2019 1010   LYMPHSABS 1.8 12/02/2019 1010   MONOABS 0.6 12/02/2019 1010   EOSABS 0.2 12/02/2019 1010   BASOSABS 0.0 12/02/2019 1010   Lab Results  Component Value Date   LDH 155 12/02/2019   LDH 154 10/07/2019   LDH 158 08/12/2019   Lab Results  Component Value Date   IGGSERUM 1,250 12/02/2019   IGGSERUM 1,208 08/12/2019   IGGSERUM 1,594 05/30/2019   IGA 6 (L) 12/02/2019   IGA <5 (L) 08/12/2019   IGA <5 (L) 05/30/2019   IGMSERUM 111 12/02/2019   IGMSERUM 102 08/12/2019   IGMSERUM 97 05/30/2019     DIAGNOSTIC IMAGING:  I have independently reviewed the scans and discussed with the patient. No results found.   ASSESSMENT:  1. Acquired hypogammaglobulinemia: -She has been receiving IVIG every 8 weeks since January 2020. -She does have severely low IgA levels.  However he does not have any  reactions.  2. DLBCL, stage IVb: -She was diagnosed 07/08/2009. Treated with 4 cycles of R-CHOP with intrathecal methotrexate during cycle 2and 4. She developed a Pseudomonas sepsis after cycles2 and 4 and therapy was stopped after 4 cycles. She also received 2 years of maintenance rituximab. -Last PET scan on 03/20/2017 did not show any evidence of lymphoma.  3. Health maintenance: -Mammogram on 11/08/2018, BI-RADS Category 1.   PLAN:  1. Acquired hypogammaglobulinemia: -She has minor chronic sinus infections. -Reviewed immunoglobulin trough levels which were normal at 1250 on 12/02/2019. -No major infections.  Continue IVIG every 8 weeks. -Reassess in 4 months.  2. DLBCL, stage IVb: -No palpable adenopathy.  LDH was normal.  No B symptoms. -No skin itching.  Continue to evaluate by labs and physical exam.  3.  Normocytic anemia: -Hemoglobin is 10 with MCV 95. -Ferritin is 79% saturation is 22. -Could not tolerate oral iron therapy. -Would recommend Feraheme weekly x2.  Orders placed this encounter:  No orders of the defined types were placed in this encounter.    Derek Jack, MD [facility] [phone]   I, Milinda Antis, am acting as a scribe for Dr. Sanda Linger.  I, Derek Jack MD, have reviewed the above documentation for accuracy and completeness, and I agree with the above.

## 2019-12-03 NOTE — Patient Instructions (Addendum)
[place] at [facility] Discharge Instructions  You were seen today by Dr. Delton Coombes. He went over your recent results. You received your IVIG today; continue receiving your infusion every 8 weeks. You will be scheduled for 2 iron infusions. Dr. Delton Coombes will see you back in 4 months for labs and follow up.   Thank you for choosing [place] at [facility] to provide your oncology and hematology care.  To afford each patient quality time with our provider, please arrive at least 15 minutes before your scheduled appointment time.   If you have a lab appointment with the [facility] please come in thru the Main Entrance and check in at the main information desk  You need to re-schedule your appointment should you arrive 10 or more minutes late.  We strive to give you quality time with our providers, and arriving late affects you and other patients whose appointments are after yours.  Also, if you no show three or more times for appointments you may be dismissed from the clinic at the providers discretion.     Again, thank you for choosing [facility].  Our hope is that these requests will decrease the amount of time that you wait before being seen by our physicians.       _____________________________________________________________  Should you have questions after your visit to [facility], please contact our office at [phone] between the hours of 8:00 a.m. and 4:30 p.m.  Voicemails left after 4:00 p.m. will not be returned until the following business day.  For prescription refill requests, have your pharmacy contact our office and allow 72 hours.    Cancer Center Support Programs:   > Cancer Support Group  2nd Tuesday of the month 1pm-2pm, Journey Room

## 2019-12-03 NOTE — Progress Notes (Signed)
Patient was assessed by Dr. Katragadda and labs have been reviewed.  Patient is okay to proceed with treatment today. Primary RN and pharmacy aware.   

## 2019-12-03 NOTE — Progress Notes (Signed)
Patient presents today for IVIG infusion.  Labs within parameters.  Vital signs stable.  No new complaints since last visit.  IVIG infusion given today per MD orders.  Tolerated infusion without adverse affects.  Vital signs stable.  No complaints at this time.  Discharge from clinic ambulatory in stable condition.  Alert and oriented X 3.  Follow up with [facility] as scheduled.

## 2019-12-05 DIAGNOSIS — M9901 Segmental and somatic dysfunction of cervical region: Secondary | ICD-10-CM | POA: Diagnosis not present

## 2019-12-05 DIAGNOSIS — M9902 Segmental and somatic dysfunction of thoracic region: Secondary | ICD-10-CM | POA: Diagnosis not present

## 2019-12-05 DIAGNOSIS — M47812 Spondylosis without myelopathy or radiculopathy, cervical region: Secondary | ICD-10-CM | POA: Diagnosis not present

## 2019-12-05 DIAGNOSIS — M4004 Postural kyphosis, thoracic region: Secondary | ICD-10-CM | POA: Diagnosis not present

## 2019-12-06 ENCOUNTER — Other Ambulatory Visit: Payer: Self-pay

## 2019-12-06 ENCOUNTER — Inpatient Hospital Stay (HOSPITAL_COMMUNITY): Payer: Medicare Other | Attending: Hematology

## 2019-12-06 VITALS — BP 146/63 | HR 85 | Temp 97.0°F | Resp 18

## 2019-12-06 DIAGNOSIS — D801 Nonfamilial hypogammaglobulinemia: Secondary | ICD-10-CM

## 2019-12-06 DIAGNOSIS — D509 Iron deficiency anemia, unspecified: Secondary | ICD-10-CM | POA: Insufficient documentation

## 2019-12-06 MED ORDER — SODIUM CHLORIDE 0.9 % IV SOLN: Freq: Once | INTRAVENOUS | Status: AC

## 2019-12-06 MED ORDER — LORATADINE 10 MG PO TABS
10.0000 mg | ORAL_TABLET | Freq: Once | ORAL | Status: AC
  Administered 2019-12-06: 10 mg via ORAL

## 2019-12-06 MED ORDER — SODIUM CHLORIDE 0.9 % IV SOLN
510.0000 mg | Freq: Once | INTRAVENOUS | Status: AC
  Administered 2019-12-06: 510 mg via INTRAVENOUS
  Filled 2019-12-06: qty 510

## 2019-12-06 MED ORDER — ACETAMINOPHEN 325 MG PO TABS
650.0000 mg | ORAL_TABLET | Freq: Once | ORAL | Status: AC
  Administered 2019-12-06: 650 mg via ORAL

## 2019-12-06 MED ORDER — ACETAMINOPHEN 325 MG PO TABS
ORAL_TABLET | ORAL | Status: AC
  Filled 2019-12-06: qty 2

## 2019-12-06 MED ORDER — LORATADINE 10 MG PO TABS
ORAL_TABLET | ORAL | Status: AC
  Filled 2019-12-06: qty 1

## 2019-12-06 NOTE — Patient Instructions (Signed)
[facility] Discharge Instructions for Patients Receiving Feraheme  Today you received the following feraheme  To help prevent nausea and vomiting after your treatment, we encourage you to take your nausea medication  If you develop nausea and vomiting that is not controlled by your nausea medication, call the clinic.   BELOW ARE SYMPTOMS THAT SHOULD BE REPORTED IMMEDIATELY:  *FEVER GREATER THAN 100.5 F  *CHILLS WITH OR WITHOUT FEVER  NAUSEA AND VOMITING THAT IS NOT CONTROLLED WITH YOUR NAUSEA MEDICATION  *UNUSUAL SHORTNESS OF BREATH  *UNUSUAL BRUISING OR BLEEDING  TENDERNESS IN MOUTH AND THROAT WITH OR WITHOUT PRESENCE OF ULCERS  *URINARY PROBLEMS  *BOWEL PROBLEMS  UNUSUAL RASH Items with * indicate a potential emergency and should be followed up as soon as possible.  Feel free to call the clinic should you have any questions or concerns. The clinic phone number is [phone].  Please show the [facility] at check-in to the Emergency Department and triage nurse.

## 2019-12-06 NOTE — Progress Notes (Signed)
Patient presents today for Feraheme infusion.  Vital signs stable.  Patient has no new complaints since last visit.  Feraheme infusion given today per MD orders.  Tolerated infusion without adverse affects.  Vital signs stable.  No complaints at this time.  Discharge from clinic ambulatory in stable condition.  Alert and oriented X 3.  Follow up with [facility] as scheduled.

## 2019-12-11 ENCOUNTER — Other Ambulatory Visit (HOSPITAL_COMMUNITY): Payer: Self-pay | Admitting: Hematology

## 2019-12-11 ENCOUNTER — Other Ambulatory Visit: Payer: Self-pay | Admitting: Adult Health

## 2019-12-11 DIAGNOSIS — D801 Nonfamilial hypogammaglobulinemia: Secondary | ICD-10-CM

## 2019-12-12 ENCOUNTER — Ambulatory Visit (HOSPITAL_COMMUNITY)
Admission: RE | Admit: 2019-12-12 | Discharge: 2019-12-12 | Disposition: A | Discharge status: Home / Self Care | Payer: Medicare Other | Source: Ambulatory Visit | Attending: Family Medicine | Admitting: Family Medicine

## 2019-12-12 ENCOUNTER — Other Ambulatory Visit: Payer: Self-pay

## 2019-12-12 DIAGNOSIS — Z1231 Encounter for screening mammogram for malignant neoplasm of breast: Secondary | ICD-10-CM | POA: Diagnosis not present

## 2019-12-13 ENCOUNTER — Encounter (HOSPITAL_COMMUNITY): Payer: Self-pay

## 2019-12-13 ENCOUNTER — Inpatient Hospital Stay (HOSPITAL_COMMUNITY): Payer: Medicare Other

## 2019-12-13 VITALS — BP 157/77 | HR 85 | Temp 97.0°F | Resp 18

## 2019-12-13 DIAGNOSIS — D801 Nonfamilial hypogammaglobulinemia: Secondary | ICD-10-CM

## 2019-12-13 DIAGNOSIS — D509 Iron deficiency anemia, unspecified: Secondary | ICD-10-CM | POA: Diagnosis not present

## 2019-12-13 MED ORDER — LORATADINE 10 MG PO TABS
10.0000 mg | ORAL_TABLET | Freq: Once | ORAL | Status: AC
  Administered 2019-12-13: 10 mg via ORAL

## 2019-12-13 MED ORDER — ACETAMINOPHEN 325 MG PO TABS: 650.0000 mg | ORAL_TABLET | Freq: Once | ORAL | Status: DC

## 2019-12-13 MED ORDER — LORATADINE 10 MG PO TABS
ORAL_TABLET | ORAL | Status: AC
  Filled 2019-12-13: qty 1

## 2019-12-13 MED ORDER — SODIUM CHLORIDE 0.9 % IV SOLN: Freq: Once | INTRAVENOUS | Status: AC

## 2019-12-13 MED ORDER — SODIUM CHLORIDE 0.9 % IV SOLN
510.0000 mg | Freq: Once | INTRAVENOUS | Status: AC
  Administered 2019-12-13: 510 mg via INTRAVENOUS
  Filled 2019-12-13: qty 510

## 2019-12-13 NOTE — Progress Notes (Signed)
Tolerated feraheme infusion w/o adverse reaction.  Alert, in no distress.  VSS.  Discharged ambulatory in stable condition.

## 2019-12-19 DIAGNOSIS — M9901 Segmental and somatic dysfunction of cervical region: Secondary | ICD-10-CM | POA: Diagnosis not present

## 2019-12-19 DIAGNOSIS — M47812 Spondylosis without myelopathy or radiculopathy, cervical region: Secondary | ICD-10-CM | POA: Diagnosis not present

## 2019-12-19 DIAGNOSIS — M9902 Segmental and somatic dysfunction of thoracic region: Secondary | ICD-10-CM | POA: Diagnosis not present

## 2019-12-19 DIAGNOSIS — M4004 Postural kyphosis, thoracic region: Secondary | ICD-10-CM | POA: Diagnosis not present

## 2020-01-02 DIAGNOSIS — M9901 Segmental and somatic dysfunction of cervical region: Secondary | ICD-10-CM | POA: Diagnosis not present

## 2020-01-02 DIAGNOSIS — M4004 Postural kyphosis, thoracic region: Secondary | ICD-10-CM | POA: Diagnosis not present

## 2020-01-02 DIAGNOSIS — M47812 Spondylosis without myelopathy or radiculopathy, cervical region: Secondary | ICD-10-CM | POA: Diagnosis not present

## 2020-01-02 DIAGNOSIS — M9902 Segmental and somatic dysfunction of thoracic region: Secondary | ICD-10-CM | POA: Diagnosis not present

## 2020-01-08 DIAGNOSIS — J069 Acute upper respiratory infection, unspecified: Secondary | ICD-10-CM | POA: Diagnosis not present

## 2020-01-16 DIAGNOSIS — M47812 Spondylosis without myelopathy or radiculopathy, cervical region: Secondary | ICD-10-CM | POA: Diagnosis not present

## 2020-01-16 DIAGNOSIS — M9902 Segmental and somatic dysfunction of thoracic region: Secondary | ICD-10-CM | POA: Diagnosis not present

## 2020-01-16 DIAGNOSIS — M4004 Postural kyphosis, thoracic region: Secondary | ICD-10-CM | POA: Diagnosis not present

## 2020-01-16 DIAGNOSIS — M9901 Segmental and somatic dysfunction of cervical region: Secondary | ICD-10-CM | POA: Diagnosis not present

## 2020-01-27 ENCOUNTER — Other Ambulatory Visit: Payer: Self-pay

## 2020-01-27 ENCOUNTER — Inpatient Hospital Stay (HOSPITAL_COMMUNITY): Payer: Medicare Other | Attending: Hematology

## 2020-01-27 DIAGNOSIS — C8330 Diffuse large B-cell lymphoma, unspecified site: Secondary | ICD-10-CM | POA: Insufficient documentation

## 2020-01-27 DIAGNOSIS — D649 Anemia, unspecified: Secondary | ICD-10-CM | POA: Diagnosis not present

## 2020-01-27 DIAGNOSIS — C8338 Diffuse large B-cell lymphoma, lymph nodes of multiple sites: Secondary | ICD-10-CM

## 2020-01-27 DIAGNOSIS — D801 Nonfamilial hypogammaglobulinemia: Secondary | ICD-10-CM | POA: Diagnosis present

## 2020-01-27 LAB — CBC WITH DIFFERENTIAL/PLATELET
Abs Immature Granulocytes: 0.02 10*3/uL (ref 0.00–0.07)
Basophils Absolute: 0.1 10*3/uL (ref 0.0–0.1)
Basophils Relative: 1 %
Eosinophils Absolute: 0.2 10*3/uL (ref 0.0–0.5)
Eosinophils Relative: 2 %
HCT: 40.4 % (ref 36.0–46.0)
Hemoglobin: 12.4 g/dL (ref 12.0–15.0)
Immature Granulocytes: 0 %
Lymphocytes Relative: 22 %
Lymphs Abs: 1.9 10*3/uL (ref 0.7–4.0)
MCH: 30.2 pg (ref 26.0–34.0)
MCHC: 30.7 g/dL (ref 30.0–36.0)
MCV: 98.5 fL (ref 80.0–100.0)
Monocytes Absolute: 0.8 10*3/uL (ref 0.1–1.0)
Monocytes Relative: 9 %
Neutro Abs: 5.8 10*3/uL (ref 1.7–7.7)
Neutrophils Relative %: 66 %
Platelets: 311 10*3/uL (ref 150–400)
RBC: 4.1 MIL/uL (ref 3.87–5.11)
RDW: 14.8 % (ref 11.5–15.5)
WBC: 8.7 10*3/uL (ref 4.0–10.5)
nRBC: 0 % (ref 0.0–0.2)

## 2020-01-27 LAB — COMPREHENSIVE METABOLIC PANEL
ALT: 13 U/L (ref 0–44)
AST: 16 U/L (ref 15–41)
Albumin: 3.2 g/dL — ABNORMAL LOW (ref 3.5–5.0)
Alkaline Phosphatase: 97 U/L (ref 38–126)
Anion gap: 9 (ref 5–15)
BUN: 14 mg/dL (ref 8–23)
CO2: 30 mmol/L (ref 22–32)
Calcium: 8.5 mg/dL — ABNORMAL LOW (ref 8.9–10.3)
Chloride: 98 mmol/L (ref 98–111)
Creatinine, Ser: 1.1 mg/dL — ABNORMAL HIGH (ref 0.44–1.00)
GFR, Estimated: 56 mL/min — ABNORMAL LOW (ref >60)
Glucose, Bld: 74 mg/dL (ref 70–99)
Potassium: 3.9 mmol/L (ref 3.5–5.1)
Sodium: 137 mmol/L (ref 135–145)
Total Bilirubin: 0.8 mg/dL (ref 0.3–1.2)
Total Protein: 7.3 g/dL (ref 6.5–8.1)

## 2020-01-27 LAB — LACTATE DEHYDROGENASE: LDH: 147 U/L (ref 98–192)

## 2020-01-28 ENCOUNTER — Inpatient Hospital Stay (HOSPITAL_COMMUNITY): Payer: Medicare Other

## 2020-01-28 ENCOUNTER — Encounter (HOSPITAL_COMMUNITY): Payer: Self-pay

## 2020-01-28 ENCOUNTER — Other Ambulatory Visit: Payer: Self-pay

## 2020-01-28 ENCOUNTER — Encounter: Payer: Self-pay | Admitting: *Deleted

## 2020-01-28 VITALS — BP 107/55 | HR 65 | Temp 96.9°F | Resp 18 | Wt 136.4 lb

## 2020-01-28 DIAGNOSIS — D801 Nonfamilial hypogammaglobulinemia: Secondary | ICD-10-CM

## 2020-01-28 DIAGNOSIS — D649 Anemia, unspecified: Secondary | ICD-10-CM | POA: Diagnosis not present

## 2020-01-28 DIAGNOSIS — C8330 Diffuse large B-cell lymphoma, unspecified site: Secondary | ICD-10-CM | POA: Diagnosis not present

## 2020-01-28 LAB — IGG, IGA, IGM
IgA: <5 mg/dL — ABNORMAL LOW (ref 87–352)
IgG (Immunoglobin G), Serum: 1520 mg/dL (ref 586–1602)
IgM (Immunoglobulin M), Srm: 125 mg/dL (ref 26–217)

## 2020-01-28 MED ORDER — IMMUNE GLOBULIN (HUMAN) 10 GM/100ML IV SOLN
30.0000 g | Freq: Once | INTRAVENOUS | Status: AC
  Administered 2020-01-28: 30 g via INTRAVENOUS
  Filled 2020-01-28: qty 200

## 2020-01-28 MED ORDER — DEXTROSE 5 % IV SOLN: INTRAVENOUS | Status: DC

## 2020-01-28 NOTE — Progress Notes (Signed)
Pt took tylenol and benadryl at home.  Tolerated treatment well today without incidence.  Vital signs stable prior to discharge.  Discharged ambulatory in stable condition.

## 2020-01-28 NOTE — Patient Instructions (Signed)
[facility] Discharge Instructions for Patients Receiving Chemotherapy  Today you received the following chemotherapy agents   To help prevent nausea and vomiting after your treatment, we encourage you to take your nausea medication   If you develop nausea and vomiting that is not controlled by your nausea medication, call the clinic.   BELOW ARE SYMPTOMS THAT SHOULD BE REPORTED IMMEDIATELY:  *FEVER GREATER THAN 100.5 F  *CHILLS WITH OR WITHOUT FEVER  NAUSEA AND VOMITING THAT IS NOT CONTROLLED WITH YOUR NAUSEA MEDICATION  *UNUSUAL SHORTNESS OF BREATH  *UNUSUAL BRUISING OR BLEEDING  TENDERNESS IN MOUTH AND THROAT WITH OR WITHOUT PRESENCE OF ULCERS  *URINARY PROBLEMS  *BOWEL PROBLEMS  UNUSUAL RASH Items with * indicate a potential emergency and should be followed up as soon as possible.  Feel free to call the clinic should you have any questions or concerns. The clinic phone number is [phone].  Please show the CHEMO ALERT CARD at check-in to the Emergency Department and triage nurse.   

## 2020-01-29 ENCOUNTER — Encounter: Payer: Self-pay | Admitting: Cardiology

## 2020-01-29 NOTE — Progress Notes (Signed)
Cardiology Office Note  Date: 01/30/2020   ID: [name], DOB 04/06/1955, MRN [id number]  PCP:  Sharilyn Sites, MD  Cardiologist:  Rozann Lesches, MD Electrophysiologist:  None   Chief Complaint  Patient presents with  . Referred with heart murmur    History of Present Illness: [name] is a 65 y.o. female referred for cardiology consultation with Dr. Hilma Favors for the evaluation of cardiac murmur.  She states that she was told that she has a heart murmur at wellness check this year and last year.  From a symptom perspective she reports chronic shortness of breath which she relates to asthma/COPD, I see that she has had follow-up with Pulmonary in the past for these issues as well as vocal for dysfunction.  She is currently following with Oncology for management of a diffuse large B-cell lymphoma.  She does not describe any exertional chest pain, sometimes has a sense of palpitations, never syncope.  Per record review she had an echocardiogram done back in 2011 in the setting of critical illness at which point LVEF was 30 to 35% with diffuse hypokinesis and moderate mitral regurgitation.  It is not clear to me that she has had a follow-up study since that time.  I personally reviewed her ECG which shows sinus rhythm.  We went over her medications, listed below.  She does not report any recent additions.  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  Also active with volunteering in the community.  Past Medical History:  Diagnosis Date  . Allergic rhinitis   . Anxiety   . Asthma   . B12 deficiency 01/18/2015  . Cataract   . Cavitary lung disease   . Chronic sinusitis   . COPD (chronic obstructive pulmonary disease) ([place])   . Depression   . Diffuse large B-cell lymphoma ([place])   . DVT (deep venous thrombosis) ([place]) 09/25/2014  . Essential hypertension   . GERD (gastroesophageal reflux disease)   . Hematuria 04/07/2016  . History of pneumonia 01/2013  . Hyperlipidemia   .  Hypogammaglobulinemia, acquired ([place])   . Iron deficiency anemia 01/18/2015  . Myocardial infarction ([place]) 2011  . 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 Orofacial dyskinesia 04/22/2015  . Osteoporosis   . Peripheral neuropathy due to chemotherapy ([place]) 05/02/2016  . Sleep apnea   . SVC syndrome 09/25/2014    Past Surgical History:  Procedure Laterality Date  . ABDOMINAL HYSTERECTOMY     Fibroids  . BASAL CELL CARCINOMA EXCISION  03/2011   scalp  . BREAST SURGERY Right    biopsy  . CATARACT EXTRACTION W/PHACO Left 11/17/2014   Procedure: CATARACT EXTRACTION PHACO AND INTRAOCULAR LENS PLACEMENT LEFT EYE;  Surgeon: Tonny Branch, MD;  Location: [facility];  Service: Ophthalmology;  Laterality: Left;  CDE:5.60  . CATARACT EXTRACTION W/PHACO Right 12/15/2014   Procedure: CATARACT EXTRACTION PHACO AND INTRAOCULAR LENS PLACEMENT RIGHT EYE CDE=5.16;  Surgeon: Tonny Branch, MD;  Location: [facility];  Service: Ophthalmology;  Laterality: Right;  . COLONOSCOPY N/A 11/14/2012   Procedure: COLONOSCOPY;  Surgeon: Rogene Houston, MD;  Location: [facility];  Service: Endoscopy;  Laterality: N/A;  830  . COLONOSCOPY WITH PROPOFOL N/A 01/12/2018   Procedure: COLONOSCOPY WITH PROPOFOL;  Surgeon: Rogene Houston, MD;  Location: [facility];  Service: Endoscopy;  Laterality: N/A;  7:30  . EYE SURGERY    . NASAL SINUS SURGERY    . NECK SURGERY     x 2   .  PERIPHERALLY INSERTED CENTRAL CATHETER INSERTION    . PICC Removal    . POLYPECTOMY  01/12/2018   Procedure: POLYPECTOMY;  Surgeon: Rogene Houston, MD;  Location: [facility];  Service: Endoscopy;;  colon  . PORT-A-CATH REMOVAL    . PORTACATH PLACEMENT  7/11   Removed 6/12  . TRACHEOSTOMY    . VESICOVAGINAL FISTULA CLOSURE W/ TAH      Current Outpatient Medications  Medication Sig Dispense Refill  . acetaminophen (TYLENOL) 500 MG tablet Take 1,000 mg by mouth every 6 (six) hours as needed for moderate pain or headache.     [name] Kitchen acyclovir  (ZOVIRAX) 400 MG tablet TAKE ONE TABLET BY MOUTH TWICE DAILY. 60 tablet 0  . albuterol (PROAIR HFA) 108 (90 Base) MCG/ACT inhaler Inhale 2 puffs into the lungs every 6 (six) hours as needed. (Patient taking differently: Inhale 2 puffs into the lungs every 6 (six) hours as needed for wheezing or shortness of breath.) 8.5 g 2  . albuterol (PROVENTIL) (2.5 MG/3ML) 0.083% nebulizer solution Take 2.5 mg by nebulization every 4 (four) hours as needed for wheezing or shortness of breath.    . amphetamine-dextroamphetamine (ADDERALL) 10 MG tablet Take 10 mg by mouth every morning.   0  . Armodafinil 150 MG tablet Take 150 mg by mouth every morning.     . budesonide-formoterol (SYMBICORT) 160-4.5 MCG/ACT inhaler Inhale 2 puffs into the lungs 2 (two) times daily. 1 Inhaler 5  . Cholecalciferol (VITAMIN D3 PO) Take 1 capsule by mouth at bedtime.    . citalopram (CELEXA) 20 MG tablet Take 20 mg by mouth every morning.    . cyanocobalamin 1000 MCG tablet Take 1,000 mcg by mouth daily.    . furosemide (LASIX) 20 MG tablet Take 1 tablet (20 mg total) by mouth as needed for fluid. 90 tablet 1  . gabapentin (NEURONTIN) 600 MG tablet 300 mg 2 (two) times daily.     . mupirocin ointment (BACTROBAN) 2 % Apply 1 application topically 3 (three) times daily.    [name] Kitchen olmesartan (BENICAR) 40 MG tablet Take 20-40 mg by mouth daily.    [name] Kitchen omeprazole (PRILOSEC) 20 MG capsule TAKE ONE CAPSULE BY MOUTH TWICE DAILY BEFORE A MEAL. 180 capsule 0  . predniSONE (DELTASONE) 5 MG tablet TAKE 1 TABLET BY MOUTH ONCE A DAY. 30 tablet 0  . simvastatin (ZOCOR) 80 MG tablet Take 1 tablet (80 mg total) by mouth at bedtime. 90 tablet 3  . zafirlukast (ACCOLATE) 20 MG tablet TAKE 1 TABLET BY MOUTH TWICE DAILY. 60 tablet 0   No current facility-administered medications for this visit.   Facility-Administered Medications Ordered in Other Visits  Medication Dose Route Frequency Provider Last Rate Last Admin  . 0.9 %  sodium chloride infusion    Intravenous Once Kefalas, Thomas S, PA-C      . 0.9 %  sodium chloride infusion   Intravenous Once Kefalas, Thomas S, PA-C      . acetaminophen (TYLENOL) tablet 650 mg  650 mg Oral Q6H PRN Baird Cancer, PA-C      . acetaminophen (TYLENOL) tablet 650 mg  650 mg Oral Q6H PRN Kefalas, Manon Hilding, PA-C      . dextrose 5 % solution   Intravenous Continuous Kefalas, Manon Hilding, PA-C   Stopped at 08/25/15 1400  . dextrose 5 % solution   Intravenous Continuous Baird Cancer, PA-C   Stopped at 07/13/16 1350  . dextrose 5 % solution   Intravenous Continuous  Holley Bouche, NP   Stopped at 02/28/17 1230  . diphenhydrAMINE (BENADRYL) capsule 25 mg  25 mg Oral Once Kefalas, Thomas S, PA-C      . diphenhydrAMINE (BENADRYL) capsule 25 mg  25 mg Oral Once Kefalas, Thomas S, PA-C      . sodium chloride 0.9 % injection 10 mL  10 mL Intracatheter PRN Baird Cancer, PA-C   10 mL at 08/25/15 1125   Allergies:  Meperidine hcl and Montelukast sodium   Social History: The patient  reports that she has never smoked. She has never used smokeless tobacco. She reports that she does not drink alcohol and does not use drugs.   Family History: The patient's family history includes Allergies in her father; Arthritis in her father; Asthma in her father; COPD in her mother; Cancer in her maternal grandmother; Diabetes in her brother; Emphysema in her mother; Heart attack in her brother; Heart disease in her mother; Heart disease (age of onset: 5) in her brother; Hypertension in her brother, brother, and sister; Leukemia in her maternal grandmother; Parkinson's disease (age of onset: 77) in her father; Stroke in her mother.   ROS: No orthopnea or PND.  Physical Exam: VS:  BP 138/82   Pulse 87   Ht 5\' 1"  (0.092 m)   Wt 136 lb (61.7 kg)   SpO2 96%   BMI 25.70 kg/m , BMI Body mass index is 25.7 kg/m.  Wt Readings from Last 3 Encounters:  01/30/20 136 lb (61.7 kg)  01/28/20 136 lb 6.4 oz (61.9 kg)  12/03/19  139 lb 3.2 oz (63.1 kg)    General: Patient appears comfortable at rest. HEENT: Conjunctiva and lids normal, wearing a mask. Neck: Supple, no elevated JVP or carotid bruits, no thyromegaly. Lungs: Decreased breath sounds without wheezing, upper airway congestion, nonlabored breathing at rest. Cardiac: Regular rate and rhythm, no S3, 2/6 basal systolic murmur, no pericardial rub. Abdomen: Soft, nontender, bowel sounds present. Extremities: No pitting edema, distal pulses 2+. Skin: Warm and dry. Musculoskeletal: No kyphosis. Neuropsychiatric: Alert and oriented x3, affect grossly appropriate.  ECG:  An ECG dated 01/04/2018 was personally reviewed today and demonstrated:  Sinus rhythm with left atrial enlargement.  Recent Labwork: 01/27/2020: ALT 13; AST 16; BUN 14; Creatinine, Ser 1.10; Hemoglobin 12.4; Platelets 311; Potassium 3.9; Sodium 137  November 2021: Hemoglobin 11.2, platelets 206, BUN 14, creatinine 0.88, potassium 3.9, AST 24, ALT 16, cholesterol 164, triglycerides 111, HDL 67, LDL 77, TSH 2.12, hemoglobin A1c 6.2%  Other Studies Reviewed Today:  Chest x-ray 07/04/2019: FINDINGS: Coarse interstitial markings are again noted throughout the lungs bilaterally, most evident throughout the mid to lower lungs, similar to prior studies. Lung volumes are normal. No consolidative airspace disease. No pleural effusions. No pneumothorax. No pulmonary nodule or mass noted. Pulmonary vasculature and the cardiomediastinal silhouette are within normal limits. Atherosclerosis in the thoracic aorta. Orthopedic fixation hardware noted in the lower cervical spine.  IMPRESSION: 1. No radiographic evidence of acute cardiopulmonary disease. 2. Chronically increased interstitial markings in the lungs bilaterally, which could indicate underlying interstitial lung disease. Further evaluation with nonemergent high-resolution chest CT could be considered if clinically appropriate. 3. Aortic  atherosclerosis.  Assessment and Plan:  1.  Systolic murmur in aortic position, echocardiogram will be obtained for further evaluation.  She had evidence of moderate mitral regurgitation by remote study in 2011, although in the setting of cardiomyopathy with critical illness.  Actually, need reevaluation of both LVEF and valvular status at  this point to help guide medical therapy and appropriate follow-up.  2.  Essential hypertension, systolic 950 today.  She is on Benicar.  3.  Mixed hyperlipidemia, on Zocor.  Recent LDL 77.  Medication Adjustments/Labs and Tests Ordered: Current medicines are reviewed at length with the patient today.  Concerns regarding medicines are outlined above.   Tests Ordered: Orders Placed This Encounter  Procedures  . EKG 12-Lead  . ECHOCARDIOGRAM COMPLETE    Medication Changes: No orders of the defined types were placed in this encounter.   Disposition:  Follow up test results.  Signed, Satira Sark, MD, [facility] 01/30/2020 9:35 AM    [facility] [place], [place], [place] [address] Phone: [phone]; Fax: [phone]

## 2020-01-30 ENCOUNTER — Other Ambulatory Visit: Payer: Self-pay

## 2020-01-30 ENCOUNTER — Encounter: Payer: Self-pay | Admitting: Cardiology

## 2020-01-30 ENCOUNTER — Telehealth: Payer: Self-pay | Admitting: Cardiology

## 2020-01-30 ENCOUNTER — Ambulatory Visit (INDEPENDENT_AMBULATORY_CARE_PROVIDER_SITE_OTHER): Payer: Medicare Other | Admitting: Cardiology

## 2020-01-30 VITALS — BP 138/82 | HR 87 | Ht 61.0 in | Wt 136.0 lb

## 2020-01-30 DIAGNOSIS — M9902 Segmental and somatic dysfunction of thoracic region: Secondary | ICD-10-CM | POA: Diagnosis not present

## 2020-01-30 DIAGNOSIS — Z8679 Personal history of other diseases of the circulatory system: Secondary | ICD-10-CM | POA: Diagnosis not present

## 2020-01-30 DIAGNOSIS — E782 Mixed hyperlipidemia: Secondary | ICD-10-CM | POA: Diagnosis not present

## 2020-01-30 DIAGNOSIS — R011 Cardiac murmur, unspecified: Secondary | ICD-10-CM

## 2020-01-30 DIAGNOSIS — M4004 Postural kyphosis, thoracic region: Secondary | ICD-10-CM | POA: Diagnosis not present

## 2020-01-30 DIAGNOSIS — I10 Essential (primary) hypertension: Secondary | ICD-10-CM

## 2020-01-30 DIAGNOSIS — M47812 Spondylosis without myelopathy or radiculopathy, cervical region: Secondary | ICD-10-CM | POA: Diagnosis not present

## 2020-01-30 DIAGNOSIS — M9901 Segmental and somatic dysfunction of cervical region: Secondary | ICD-10-CM | POA: Diagnosis not present

## 2020-01-30 NOTE — Patient Instructions (Addendum)
Medication Instructions:  Your physician recommends that you continue on your current medications as directed. Please refer to the Current Medication list given to you today.  Labwork: none  Testing/Procedures: Your physician has requested that you have an echocardiogram. Echocardiography is a painless test that uses sound waves to create images of your heart. It provides your doctor with information about the size and shape of your heart and how well your heart's chambers and valves are working. This procedure takes approximately one hour. There are no restrictions for this procedure.  Follow-Up: Your physician recommends that you schedule a follow-up appointment in: pending  Any Other Special Instructions Will Be Listed Below (If Applicable).  If you need a refill on your cardiac medications before your next appointment, please call your pharmacy. 

## 2020-01-30 NOTE — Telephone Encounter (Signed)
Pre-cert Verification for the following procedure    ECHO   DATE:   02/05/2020  LOCATION: [facility]

## 2020-02-05 ENCOUNTER — Other Ambulatory Visit: Payer: Self-pay

## 2020-02-05 ENCOUNTER — Ambulatory Visit (HOSPITAL_COMMUNITY)
Admission: RE | Admit: 2020-02-05 | Discharge: 2020-02-05 | Disposition: A | Discharge status: Home / Self Care | Payer: Medicare Other | Source: Ambulatory Visit | Attending: Cardiology | Admitting: Cardiology

## 2020-02-05 DIAGNOSIS — R011 Cardiac murmur, unspecified: Secondary | ICD-10-CM | POA: Diagnosis not present

## 2020-02-05 LAB — ECHOCARDIOGRAM COMPLETE
AR max vel: 1.6 cm2
AV Area VTI: 1.64 cm2
AV Area mean vel: 1.62 cm2
AV Mean grad: 8 mmHg
AV Peak grad: 15.1 mmHg
Ao pk vel: 1.94 m/s
Area-P 1/2: 3.31 cm2
S' Lateral: 2.7 cm

## 2020-02-05 NOTE — Progress Notes (Signed)
*  PRELIMINARY RESULTS* Echocardiogram 2D Echocardiogram has been performed.  [name] 02/05/2020, 9:05 AM

## 2020-02-07 ENCOUNTER — Telehealth: Payer: Self-pay | Admitting: *Deleted

## 2020-02-07 NOTE — Telephone Encounter (Signed)
Pt voiced understanding - recall placed  °

## 2020-02-07 NOTE — Telephone Encounter (Signed)
-----   Message from Satira Sark, MD sent at 02/05/2020 11:44 AM EST ----- Results reviewed.  Please let her know at this point cardiac function is normal with LVEF 60 to 65%, this is improved compared to remote testing years ago when she was hospitalized.  She has only trivial mitral regurgitation.  Aortic valve is calcified and mildly stenotic which explains her heart murmur, but would not expect this to cause any symptoms at this point.  We can see her back in 1 year for follow-up.

## 2020-02-13 DIAGNOSIS — M9901 Segmental and somatic dysfunction of cervical region: Secondary | ICD-10-CM | POA: Diagnosis not present

## 2020-02-13 DIAGNOSIS — M9902 Segmental and somatic dysfunction of thoracic region: Secondary | ICD-10-CM | POA: Diagnosis not present

## 2020-02-13 DIAGNOSIS — M4004 Postural kyphosis, thoracic region: Secondary | ICD-10-CM | POA: Diagnosis not present

## 2020-02-13 DIAGNOSIS — M47812 Spondylosis without myelopathy or radiculopathy, cervical region: Secondary | ICD-10-CM | POA: Diagnosis not present

## 2020-02-19 DIAGNOSIS — L821 Other seborrheic keratosis: Secondary | ICD-10-CM | POA: Diagnosis not present

## 2020-02-19 DIAGNOSIS — Z85828 Personal history of other malignant neoplasm of skin: Secondary | ICD-10-CM | POA: Diagnosis not present

## 2020-02-19 DIAGNOSIS — L570 Actinic keratosis: Secondary | ICD-10-CM | POA: Diagnosis not present

## 2020-02-27 DIAGNOSIS — M47812 Spondylosis without myelopathy or radiculopathy, cervical region: Secondary | ICD-10-CM | POA: Diagnosis not present

## 2020-02-27 DIAGNOSIS — M9901 Segmental and somatic dysfunction of cervical region: Secondary | ICD-10-CM | POA: Diagnosis not present

## 2020-02-27 DIAGNOSIS — M4004 Postural kyphosis, thoracic region: Secondary | ICD-10-CM | POA: Diagnosis not present

## 2020-02-27 DIAGNOSIS — M9902 Segmental and somatic dysfunction of thoracic region: Secondary | ICD-10-CM | POA: Diagnosis not present

## 2020-03-10 DIAGNOSIS — M9901 Segmental and somatic dysfunction of cervical region: Secondary | ICD-10-CM | POA: Diagnosis not present

## 2020-03-10 DIAGNOSIS — M47812 Spondylosis without myelopathy or radiculopathy, cervical region: Secondary | ICD-10-CM | POA: Diagnosis not present

## 2020-03-10 DIAGNOSIS — M4004 Postural kyphosis, thoracic region: Secondary | ICD-10-CM | POA: Diagnosis not present

## 2020-03-10 DIAGNOSIS — M9902 Segmental and somatic dysfunction of thoracic region: Secondary | ICD-10-CM | POA: Diagnosis not present

## 2020-03-23 ENCOUNTER — Inpatient Hospital Stay (HOSPITAL_COMMUNITY): Payer: Medicare Other | Attending: Hematology

## 2020-03-23 ENCOUNTER — Other Ambulatory Visit (HOSPITAL_COMMUNITY): Payer: Self-pay | Admitting: Surgery

## 2020-03-23 ENCOUNTER — Other Ambulatory Visit: Payer: Self-pay

## 2020-03-23 DIAGNOSIS — D801 Nonfamilial hypogammaglobulinemia: Secondary | ICD-10-CM | POA: Diagnosis not present

## 2020-03-23 DIAGNOSIS — C8338 Diffuse large B-cell lymphoma, lymph nodes of multiple sites: Secondary | ICD-10-CM

## 2020-03-23 DIAGNOSIS — Z8572 Personal history of non-Hodgkin lymphomas: Secondary | ICD-10-CM | POA: Diagnosis not present

## 2020-03-23 DIAGNOSIS — D649 Anemia, unspecified: Secondary | ICD-10-CM | POA: Diagnosis not present

## 2020-03-23 LAB — LACTATE DEHYDROGENASE: LDH: 171 U/L (ref 98–192)

## 2020-03-23 LAB — CBC WITH DIFFERENTIAL/PLATELET
Abs Immature Granulocytes: 0.02 10*3/uL (ref 0.00–0.07)
Basophils Absolute: 0.1 10*3/uL (ref 0.0–0.1)
Basophils Relative: 1 %
Eosinophils Absolute: 0.2 10*3/uL (ref 0.0–0.5)
Eosinophils Relative: 3 %
HCT: 41.9 % (ref 36.0–46.0)
Hemoglobin: 13.5 g/dL (ref 12.0–15.0)
Immature Granulocytes: 0 %
Lymphocytes Relative: 21 %
Lymphs Abs: 1.7 10*3/uL (ref 0.7–4.0)
MCH: 32.2 pg (ref 26.0–34.0)
MCHC: 32.2 g/dL (ref 30.0–36.0)
MCV: 100 fL (ref 80.0–100.0)
Monocytes Absolute: 0.6 10*3/uL (ref 0.1–1.0)
Monocytes Relative: 7 %
Neutro Abs: 5.7 10*3/uL (ref 1.7–7.7)
Neutrophils Relative %: 68 %
Platelets: 148 10*3/uL — ABNORMAL LOW (ref 150–400)
RBC: 4.19 MIL/uL (ref 3.87–5.11)
RDW: 12.8 % (ref 11.5–15.5)
WBC: 8.3 10*3/uL (ref 4.0–10.5)
nRBC: 0 % (ref 0.0–0.2)

## 2020-03-23 LAB — IRON AND TIBC
Iron: 106 ug/dL (ref 28–170)
Saturation Ratios: 54 % — ABNORMAL HIGH (ref 10.4–31.8)
TIBC: 195 ug/dL — ABNORMAL LOW (ref 250–450)
UIBC: 89 ug/dL

## 2020-03-23 LAB — COMPREHENSIVE METABOLIC PANEL
ALT: 11 U/L (ref 0–44)
AST: 17 U/L (ref 15–41)
Albumin: 3.5 g/dL (ref 3.5–5.0)
Alkaline Phosphatase: 108 U/L (ref 38–126)
Anion gap: 9 (ref 5–15)
BUN: 12 mg/dL (ref 8–23)
CO2: 29 mmol/L (ref 22–32)
Calcium: 8.5 mg/dL — ABNORMAL LOW (ref 8.9–10.3)
Chloride: 102 mmol/L (ref 98–111)
Creatinine, Ser: 0.95 mg/dL (ref 0.44–1.00)
GFR, Estimated: >60 mL/min (ref >60)
Glucose, Bld: 82 mg/dL (ref 70–99)
Potassium: 3.6 mmol/L (ref 3.5–5.1)
Sodium: 140 mmol/L (ref 135–145)
Total Bilirubin: 0.5 mg/dL (ref 0.3–1.2)
Total Protein: 6.8 g/dL (ref 6.5–8.1)

## 2020-03-23 LAB — FERRITIN: Ferritin: 123 ng/mL (ref 11–307)

## 2020-03-24 ENCOUNTER — Inpatient Hospital Stay (HOSPITAL_COMMUNITY): Payer: Medicare Other

## 2020-03-24 ENCOUNTER — Inpatient Hospital Stay (HOSPITAL_BASED_OUTPATIENT_CLINIC_OR_DEPARTMENT_OTHER): Payer: Medicare Other | Admitting: Hematology

## 2020-03-24 VITALS — BP 138/67 | HR 57 | Temp 97.1°F | Resp 18

## 2020-03-24 VITALS — BP 135/75 | HR 69 | Temp 97.1°F | Resp 18 | Wt 139.4 lb

## 2020-03-24 DIAGNOSIS — C8338 Diffuse large B-cell lymphoma, lymph nodes of multiple sites: Secondary | ICD-10-CM | POA: Diagnosis not present

## 2020-03-24 DIAGNOSIS — D649 Anemia, unspecified: Secondary | ICD-10-CM | POA: Diagnosis not present

## 2020-03-24 DIAGNOSIS — D801 Nonfamilial hypogammaglobulinemia: Secondary | ICD-10-CM | POA: Diagnosis not present

## 2020-03-24 DIAGNOSIS — Z8572 Personal history of non-Hodgkin lymphomas: Secondary | ICD-10-CM | POA: Diagnosis not present

## 2020-03-24 LAB — IGG, IGA, IGM
IgA: <5 mg/dL — ABNORMAL LOW (ref 87–352)
IgG (Immunoglobin G), Serum: 1272 mg/dL (ref 586–1602)
IgM (Immunoglobulin M), Srm: 72 mg/dL (ref 26–217)

## 2020-03-24 MED ORDER — ACETAMINOPHEN 325 MG PO TABS: 650.0000 mg | ORAL_TABLET | Freq: Four times a day (QID) | ORAL | Status: DC | PRN

## 2020-03-24 MED ORDER — IMMUNE GLOBULIN (HUMAN) 10 GM/100ML IV SOLN
30.0000 g | Freq: Once | INTRAVENOUS | Status: AC
  Administered 2020-03-24: 30 g via INTRAVENOUS
  Filled 2020-03-24: qty 300

## 2020-03-24 MED ORDER — AMOXICILLIN 500 MG PO CAPS: 500.0000 mg | ORAL_CAPSULE | Freq: Three times a day (TID) | ORAL | 0 refills | Status: DC

## 2020-03-24 MED ORDER — ACYCLOVIR 400 MG PO TABS: 400.0000 mg | ORAL_TABLET | Freq: Two times a day (BID) | ORAL | 0 refills | Status: DC

## 2020-03-24 MED ORDER — DEXTROSE 5 % IV SOLN: INTRAVENOUS | Status: DC

## 2020-03-24 MED ORDER — DIPHENHYDRAMINE HCL 25 MG PO CAPS: 25.0000 mg | ORAL_CAPSULE | Freq: Once | ORAL | Status: DC

## 2020-03-24 NOTE — Patient Instructions (Signed)
[place] at [facility] Discharge Instructions  You were seen today by Dr. Delton Coombes. He went over your recent results. You received your IVIG today; continue getting your IVIG every 8 weeks. You will be prescribed amoxicillin to take three times a day for 7 days; if your nasal discharge does not improve, please call the office. Dr. Delton Coombes will see you back in 4 months for labs and follow up.   Thank you for choosing [place] at [facility] to provide your oncology and hematology care.  To afford each patient quality time with our provider, please arrive at least 15 minutes before your scheduled appointment time.   If you have a lab appointment with the [facility] please come in thru the Main Entrance and check in at the main information desk  You need to re-schedule your appointment should you arrive 10 or more minutes late.  We strive to give you quality time with our providers, and arriving late affects you and other patients whose appointments are after yours.  Also, if you no show three or more times for appointments you may be dismissed from the clinic at the providers discretion.     Again, thank you for choosing [facility].  Our hope is that these requests will decrease the amount of time that you wait before being seen by our physicians.       _____________________________________________________________  Should you have questions after your visit to [facility], please contact our office at [phone] between the hours of 8:00 a.m. and 4:30 p.m.  Voicemails left after 4:00 p.m. will not be returned until the following business day.  For prescription refill requests, have your pharmacy contact our office and allow 72 hours.    Cancer Center Support Programs:   > Cancer Support Group  2nd Tuesday of the month 1pm-2pm, Journey Room

## 2020-03-24 NOTE — Progress Notes (Signed)
Patient was assessed by Dr. Katragadda and labs have been reviewed.  Patient is okay to proceed with treatment today. Primary RN and pharmacy aware.   

## 2020-03-24 NOTE — Progress Notes (Signed)
[name], [place] [address]   CLINIC:  Medical Oncology/Hematology  PCP:  Sharilyn Sites, MD [address] / [place] [address]  [phone]  REASON FOR VISIT:  Follow-up for acquired hypogammaglobulinemia and DLBCL  PRIOR THERAPY: R-CHOP x 4 cycles with intrathecal methotrexate during cycles 2 & 4  CURRENT THERAPY: IVIG every 8 weeks; intermittent Feraheme last on 12/13/2019  INTERVAL HISTORY:  [name], a 65 y.o. female, returns for routine follow-up for her acquired hypogammaglobulinemia and DLBCL. [place] was last seen on 12/03/2019.  Today she reports feeling okay. She reports sensing a burning smell for the past 3 days since 03/19; she notes that she used to smell rotten eggs when she was infected with Pseudomonas in her sinuses years ago. She continues having chronic nasal discharge. She denies having any new headaches or sinus congestion. She is tolerating the IVIG well and denies having any issues. She denies having any recent pneumonia or UTI infections.   REVIEW OF SYSTEMS:  Review of Systems  Constitutional: Positive for appetite change (50%) and fatigue (50%).  HENT:         Abnormal burnt smell Chronic nasal discharge  Neurological: Negative for headaches.  All other systems reviewed and are negative.   PAST MEDICAL/SURGICAL HISTORY:  Past Medical History:  Diagnosis Date  . Allergic rhinitis   . Anxiety   . Asthma   . B12 deficiency 01/18/2015  . Cataract   . Cavitary lung disease   . Chronic sinusitis   . COPD (chronic obstructive pulmonary disease) ([facility])   . Depression   . Diffuse large B-cell lymphoma ([facility])   . DVT (deep venous thrombosis) ([facility]) 09/25/2014  . Essential hypertension   . GERD (gastroesophageal reflux disease)   . Hematuria 04/07/2016  . History of pneumonia 01/2013  . Hyperlipidemia   . Hypogammaglobulinemia, acquired ([facility])   . Iron deficiency anemia 01/18/2015  . Myocardial  infarction ([place] [facility]) 2011  . 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 Orofacial dyskinesia 04/22/2015  . Osteoporosis   . Peripheral neuropathy due to chemotherapy ([facility]) 05/02/2016  . Sleep apnea   . SVC syndrome 09/25/2014   Past Surgical History:  Procedure Laterality Date  . ABDOMINAL HYSTERECTOMY     Fibroids  . BASAL CELL CARCINOMA EXCISION  03/2011   scalp  . BREAST SURGERY Right    biopsy  . CATARACT EXTRACTION W/PHACO Left 11/17/2014   Procedure: CATARACT EXTRACTION PHACO AND INTRAOCULAR LENS PLACEMENT LEFT EYE;  Surgeon: Tonny Branch, MD;  Location: [facility];  Service: Ophthalmology;  Laterality: Left;  CDE:5.60  . CATARACT EXTRACTION W/PHACO Right 12/15/2014   Procedure: CATARACT EXTRACTION PHACO AND INTRAOCULAR LENS PLACEMENT RIGHT EYE CDE=5.16;  Surgeon: Tonny Branch, MD;  Location: [facility];  Service: Ophthalmology;  Laterality: Right;  . COLONOSCOPY N/A 11/14/2012   Procedure: COLONOSCOPY;  Surgeon: Rogene Houston, MD;  Location: [facility] SU[facility];  Service: Endoscopy;  Laterality: N/A;  830  . COLONOSCOPY WITH PROPOFOL N/A 01/12/2018   Procedure: COLONOSCOPY WITH PROPOFOL;  Surgeon: Rogene Houston, MD;  Location: [facility];  Service: Endoscopy;  Laterality: N/A;  7:30  . EYE SURGERY    . NASAL SINUS SURGERY    . NECK SURGERY     x 2   . PERIPHERALLY INSERTED CENTRAL CATHETER INSERTION    . PICC Removal    . POLYPECTOMY  01/12/2018   Procedure: POLYPECTOMY;  Surgeon: Rogene Houston, MD;  Location: [facility]  END[facility] SUITE;  Service: Endoscopy;;  colon  . PORT-A-CATH REMOVAL    . PORTACATH PLACEMENT  7/11   Removed 6/12  . TRACHEOSTOMY    . VESICOVAGINAL FISTULA CLOSURE W/ TAH      SOCIAL HISTORY:  Social History   Socioeconomic History  . Marital status: Divorced    Spouse name: Not on file  . Number of children: 3  . Years of education: 45  . Highest education level: Not on file  Occupational History  . Occupation: Estate agent    Comment: [employer]  .  Smoking status: Never Smoker  . Smokeless tobacco: Never Used  Vaping Use  . Vaping Use: Never used  Substance and Sexual Activity  . Alcohol use: No  . Drug use: No  . Sexual activity: Not on file    Comment: hyst  Other Topics Concern  . Not on file  Social History Narrative   Originally from [place]. Always lived in [place]. Prior travel to [place]. 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 She has 1 dog currently. She has 1 conures (small parrots). No mold exposure in her home. 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 No hot tub exposure. She enjoys sewing & quilting.    Social Determinants of Health   Financial Resource Strain: Medium Risk  . Difficulty of Paying Living Expenses: Somewhat hard  Food Insecurity: No Food Insecurity  . Worried About Charity fundraiser in the Last Year: Never true  . Ran Out of Food in the Last Year: Never true  Transportation Needs: No Transportation Needs  . Lack of Transportation (Medical): No  . Lack of Transportation (Non-Medical): No  Physical Activity: Insufficiently Active  . Days of Exercise per Week: 2 days  . Minutes of Exercise per Session: 10 min  Stress: No Stress Concern Present  . Feeling of Stress : Not at all  Social Connections: Moderately Integrated  . Frequency of Communication with Friends and Family: More than three times a week  . Frequency of Social Gatherings with Friends and Family: More than three times a week  . Attends Religious Services: More than 4 times per year  . Active Member of Clubs or Organizations: Yes  . Attends Archivist Meetings: More than 4 times per year  . Marital Status: Divorced  Human resources officer Violence: Not At Risk  . Fear of Current or Ex-Partner: No  . Emotionally Abused: No  . Physically Abused: No  . Sexually Abused: No    FAMILY HISTORY:  Family History  Problem Relation Age of Onset  . Emphysema Mother   .  Stroke Mother   . COPD Mother   . Heart disease Mother        died in sleep  49  . Allergies Father   . Asthma Father        as a child  . Arthritis Father   . Parkinson's disease Father 76  . Leukemia Maternal Grandmother   . Cancer Maternal Grandmother        Leukemia  . Diabetes Brother   . Heart attack Brother   . Hypertension Brother   . Heart disease Brother 40       stents  . Hypertension Sister   . Hypertension Brother     CURRENT MEDICATIONS:  Current Outpatient Medications  Medication Sig Dispense Refill  . acetaminophen (TYLENOL) 500  MG tablet Take 1,000 mg by mouth every 6 (six) hours as needed for moderate pain or headache.     . albuterol (PROAIR HFA) 108 (90 Base) MCG/ACT inhaler Inhale 2 puffs into the lungs every 6 (six) hours as needed. (Patient taking differently: Inhale 2 puffs into the lungs every 6 (six) hours as needed for wheezing or shortness of breath.) 8.5 g 2  . albuterol (PROVENTIL) (2.5 MG/3ML) 0.083% nebulizer solution Take 2.5 mg by nebulization every 4 (four) hours as needed for wheezing or shortness of breath.    [name] Kitchen amoxicillin (AMOXIL) 500 MG capsule Take 1 capsule (500 mg total) by mouth 3 (three) times daily. 21 capsule 0  . amphetamine-dextroamphetamine (ADDERALL) 10 MG tablet Take 10 mg by mouth every morning.   0  . Armodafinil 150 MG tablet Take 150 mg by mouth every morning.     . budesonide-formoterol (SYMBICORT) 160-4.5 MCG/ACT inhaler Inhale 2 puffs into the lungs 2 (two) times daily. 1 Inhaler 5  . Cholecalciferol (VITAMIN D3 PO) Take 1 capsule by mouth at bedtime.    . citalopram (CELEXA) 20 MG tablet Take 20 mg by mouth every morning.    . cyanocobalamin 1000 MCG tablet Take 1,000 mcg by mouth daily.    . furosemide (LASIX) 20 MG tablet Take 1 tablet (20 mg total) by mouth as needed for fluid. 90 tablet 1  . gabapentin (NEURONTIN) 600 MG tablet 300 mg 2 (two) times daily.     . mupirocin ointment (BACTROBAN) 2 % Apply 1 application  topically 3 (three) times daily.    [name] Kitchen olmesartan (BENICAR) 40 MG tablet Take 20-40 mg by mouth daily.    [name] Kitchen omeprazole (PRILOSEC) 20 MG capsule TAKE ONE CAPSULE BY MOUTH TWICE DAILY BEFORE A MEAL. 180 capsule 0  . predniSONE (DELTASONE) 5 MG tablet TAKE 1 TABLET BY MOUTH ONCE A DAY. 30 tablet 0  . simvastatin (ZOCOR) 80 MG tablet Take 1 tablet (80 mg total) by mouth at bedtime. 90 tablet 3  . zafirlukast (ACCOLATE) 20 MG tablet TAKE 1 TABLET BY MOUTH TWICE DAILY. 60 tablet 0  . acyclovir (ZOVIRAX) 400 MG tablet Take 1 tablet (400 mg total) by mouth 2 (two) times daily. 60 tablet 0   No current facility-administered medications for this visit.   Facility-Administered Medications Ordered in Other Visits  Medication Dose Route Frequency Provider Last Rate Last Admin  . 0.9 %  sodium chloride infusion   Intravenous Once Kefalas, Thomas S, PA-C      . 0.9 %  sodium chloride infusion   Intravenous Once Kefalas, Thomas S, PA-C      . acetaminophen (TYLENOL) tablet 650 mg  650 mg Oral Q6H PRN Baird Cancer, PA-C      . acetaminophen (TYLENOL) tablet 650 mg  650 mg Oral Q6H PRN Kefalas, Manon Hilding, PA-C      . acetaminophen (TYLENOL) tablet 650 mg  650 mg Oral Q6H PRN Derek Jack, MD      . dextrose 5 % solution   Intravenous Continuous Baird Cancer, PA-C   Stopped at 08/25/15 1400  . dextrose 5 % solution   Intravenous Continuous Baird Cancer, PA-C   Stopped at 07/13/16 1350  . dextrose 5 % solution   Intravenous Continuous Holley Bouche, NP   Stopped at 02/28/17 1230  . dextrose 5 % solution   Intravenous Continuous Derek Jack, MD 10 mL/hr at 03/24/20 0925 New Bag at 03/24/20 0925  . diphenhydrAMINE (BENADRYL) capsule  25 mg  25 mg Oral Once Kefalas, Thomas S, PA-C      . diphenhydrAMINE (BENADRYL) capsule 25 mg  25 mg Oral Once Kefalas, Thomas S, PA-C      . diphenhydrAMINE (BENADRYL) capsule 25 mg  25 mg Oral Once Derek Jack, MD      . sodium chloride  0.9 % injection 10 mL  10 mL Intracatheter PRN Baird Cancer, PA-C   10 mL at 08/25/15 1125    ALLERGIES:  Allergies  Allergen Reactions  . Meperidine Hcl Anaphylaxis  . Montelukast Sodium Hives and Rash    PHYSICAL EXAM:  Performance status (ECOG): 1 - Symptomatic but completely ambulatory  Vitals:   03/24/20 0920  BP: 135/75  Pulse: 69  Resp: 18  Temp: (!) 97.1 F (36.2 C)  SpO2: 98%   Wt Readings from Last 3 Encounters:  03/24/20 139 lb 6.4 oz (63.2 kg)  01/30/20 136 lb (61.7 kg)  01/28/20 136 lb 6.4 oz (61.9 kg)   Physical Exam Vitals reviewed.  Constitutional:      Appearance: Normal appearance.  HENT:     Nose:     Right Sinus: No maxillary sinus tenderness or frontal sinus tenderness.     Left Sinus: No maxillary sinus tenderness or frontal sinus tenderness.  Cardiovascular:     Rate and Rhythm: Normal rate and regular rhythm.     Pulses: Normal pulses.     Heart sounds: Murmur (faint systolic murmur) heard.    Pulmonary:     Effort: Pulmonary effort is normal.     Breath sounds: Normal breath sounds.  Abdominal:     Palpations: Abdomen is soft. There is no mass.     Tenderness: There is no abdominal tenderness.     Hernia: No hernia is present.  Musculoskeletal:     Right lower leg: No edema.     Left lower leg: No edema.  Neurological:     General: No focal deficit present.     Mental Status: She is alert and oriented to person, place, and time.  Psychiatric:        Mood and Affect: Mood normal.        Behavior: Behavior normal.     LABORATORY DATA:  I have reviewed the labs as listed.  CBC Latest Ref Rng & Units 03/23/2020 01/27/2020 12/02/2019  WBC 4.0 - 10.5 K/uL 8.3 8.7 9.6  Hemoglobin 12.0 - 15.0 g/dL 13.5 12.4 10.6(L)  Hematocrit 36.0 - 46.0 % 41.9 40.4 35.0(L)  Platelets 150 - 400 K/uL 148(L) 311 196   CMP Latest Ref Rng & Units 03/23/2020 01/27/2020 12/02/2019  Glucose 70 - 99 mg/dL 82 74 108(H)  BUN 8 - 23 mg/dL 12 14 11    Creatinine 0.44 - 1.00 mg/dL 0.95 1.10(H) 0.90  Sodium 135 - 145 mmol/L 140 137 138  Potassium 3.5 - 5.1 mmol/L 3.6 3.9 3.2(L)  Chloride 98 - 111 mmol/L 102 98 103  CO2 22 - 32 mmol/L 29 30 26   Calcium 8.9 - 10.3 mg/dL 8.5(L) 8.5(L) 8.5(L)  Total Protein 6.5 - 8.1 g/dL 6.8 7.3 6.7  Total Bilirubin 0.3 - 1.2 mg/dL 0.5 0.8 0.5  Alkaline Phos 38 - 126 U/L 108 97 83  AST 15 - 41 U/L 17 16 15   ALT 0 - 44 U/L 11 13 12       Component Value Date/Time   RBC 4.19 03/23/2020 0943   MCV 100.0 03/23/2020 0943   MCH 32.2 03/23/2020 0943  MCHC 32.2 03/23/2020 0943   RDW 12.8 03/23/2020 0943   LYMPHSABS 1.7 03/23/2020 0943   MONOABS 0.6 03/23/2020 0943   EOSABS 0.2 03/23/2020 0943   BASOSABS 0.1 03/23/2020 0943   Lab Results  Component Value Date   LDH 171 03/23/2020   LDH 147 01/27/2020   LDH 155 12/02/2019   Lab Results  Component Value Date   TIBC 195 (L) 03/23/2020   TIBC 261 10/07/2019   TIBC 248 (L) 07/28/2015   FERRITIN 123 03/23/2020   FERRITIN 17 10/07/2019   FERRITIN 44 12/15/2015   IRONPCTSAT 54 (H) 03/23/2020   IRONPCTSAT 22 10/07/2019   IRONPCTSAT 21 07/28/2015   Lab Results  Component Value Date   IGGSERUM 1,272 03/23/2020   IGGSERUM 1,520 01/27/2020   IGGSERUM 1,250 12/02/2019   IGA <5 (L) 03/23/2020   IGA <5 (L) 01/27/2020   IGA 6 (L) 12/02/2019   IGMSERUM 72 03/23/2020   IGMSERUM 125 01/27/2020   IGMSERUM 111 12/02/2019     DIAGNOSTIC IMAGING:  I have independently reviewed the scans and discussed with the patient. No results found.   ASSESSMENT:  1. Acquired hypogammaglobulinemia: -She has been receiving IVIG every 8 weeks since January 2020. -She does have severely low IgA levels. However he does not have any reactions.  2. DLBCL, stage IVb: -She was diagnosed 07/08/2009. Treated with 4 cycles of R-CHOP with intrathecal methotrexate during cycle 2and 4. She developed a Pseudomonas sepsis after cycles2 and 4 and therapy was stopped after 4  cycles. She also received 2 years of maintenance rituximab. -Last PET scan on 03/20/2017 did not show any evidence of lymphoma.  3. Health maintenance: -Mammogram on 11/08/2018, BI-RADS Category 1.   PLAN:  1. Acquired hypogammaglobulinemia: -Reviewed her immunoglobulin trough levels which were 1272 on 03/23/2020. -She had minor sinus infections.  She reported yellowish discharge and smelling "burnt smell" for the past 2 to 3 days. -We will give her amoxicillin 500 mg 3 times a day for 7 days.  If no improvement will do CT sinuses as she had prior infection of Pseudomonas in the sinuses. -Reviewed her LFTs which are within normal limits. -We will proceed with IVIG today and every 8 weeks.  RTC 4 months for follow-up.  2. DLBCL, stage IVb: -No palpable adenopathy or splenomegaly. -LDH was normal.  No B symptoms.  We will continue monitoring with physical exam.  3.  Normocytic anemia: -She received Feraheme on 12/06/2019 and 12/13/2019. -Her hemoglobin improved to 13.5.  Ferritin is 123 and percent saturation 54.  No parenteral iron therapy needed.  Orders placed this encounter:  No orders of the defined types were placed in this encounter.    Derek Jack, MD [place] [phone]   I, Milinda Antis, am acting as a scribe for Dr. Sanda Linger.  I, Derek Jack MD, have reviewed the above documentation for accuracy and completeness, and I agree with the above.

## 2020-03-24 NOTE — Patient Instructions (Signed)
[facility]  Discharge Instructions:  IVIG _______________________________________________________________  Thank you for choosing [facility] at [facility] to provide your oncology and hematology care.  To afford each patient quality time with our providers, please arrive at least 15 minutes before your scheduled appointment.  You need to re-schedule your appointment if you arrive 10 or more minutes late.  We strive to give you quality time with our providers, and arriving late affects you and other patients whose appointments are after yours.  Also, if you no show three or more times for appointments you may be dismissed from the clinic.  Again, thank you for choosing [facility]. Our hope is that these requests will allow you access to exceptional care and in a timely manner. _______________________________________________________________  If you have questions after your visit, please contact our office at [phone] between the hours of 8:30 a.m. and 5:00 p.m. Voicemails left after 4:30 p.m. will not be returned until the following business day. _______________________________________________________________  For prescription refill requests, have your pharmacy contact our office. _______________________________________________________________  Recommendations made by the consultant and any test results will be sent to your referring physician. _______________________________________________________________ 

## 2020-03-24 NOTE — Progress Notes (Signed)
Patient presents today for treatment and follow up visit with Dr. Delton Coombes. Labs reviewed. Patient took Tylenol and Benadryl at home prior to arrival at 07:00am. [facility] reviewed and updated. Patient has no complaints of any changes since her last visit.   IVIG given today per MD orders. Tolerated infusion without adverse affects. Vital signs stable. No complaints at this time. Discharged from clinic ambulatory in stable condition. Alert and oriented x 3. F/U with [facility] as scheduled.

## 2020-03-26 DIAGNOSIS — M9902 Segmental and somatic dysfunction of thoracic region: Secondary | ICD-10-CM | POA: Diagnosis not present

## 2020-03-26 DIAGNOSIS — M9901 Segmental and somatic dysfunction of cervical region: Secondary | ICD-10-CM | POA: Diagnosis not present

## 2020-03-26 DIAGNOSIS — M4004 Postural kyphosis, thoracic region: Secondary | ICD-10-CM | POA: Diagnosis not present

## 2020-03-26 DIAGNOSIS — M47812 Spondylosis without myelopathy or radiculopathy, cervical region: Secondary | ICD-10-CM | POA: Diagnosis not present

## 2020-04-09 DIAGNOSIS — M47812 Spondylosis without myelopathy or radiculopathy, cervical region: Secondary | ICD-10-CM | POA: Diagnosis not present

## 2020-04-09 DIAGNOSIS — M9902 Segmental and somatic dysfunction of thoracic region: Secondary | ICD-10-CM | POA: Diagnosis not present

## 2020-04-09 DIAGNOSIS — M4004 Postural kyphosis, thoracic region: Secondary | ICD-10-CM | POA: Diagnosis not present

## 2020-04-09 DIAGNOSIS — M9901 Segmental and somatic dysfunction of cervical region: Secondary | ICD-10-CM | POA: Diagnosis not present

## 2020-04-23 DIAGNOSIS — M4004 Postural kyphosis, thoracic region: Secondary | ICD-10-CM | POA: Diagnosis not present

## 2020-04-23 DIAGNOSIS — M47812 Spondylosis without myelopathy or radiculopathy, cervical region: Secondary | ICD-10-CM | POA: Diagnosis not present

## 2020-04-23 DIAGNOSIS — M9902 Segmental and somatic dysfunction of thoracic region: Secondary | ICD-10-CM | POA: Diagnosis not present

## 2020-04-23 DIAGNOSIS — M9901 Segmental and somatic dysfunction of cervical region: Secondary | ICD-10-CM | POA: Diagnosis not present

## 2020-05-04 DIAGNOSIS — Z6824 Body mass index (BMI) 24.0-24.9, adult: Secondary | ICD-10-CM | POA: Diagnosis not present

## 2020-05-04 DIAGNOSIS — F909 Attention-deficit hyperactivity disorder, unspecified type: Secondary | ICD-10-CM | POA: Diagnosis not present

## 2020-05-04 DIAGNOSIS — J449 Chronic obstructive pulmonary disease, unspecified: Secondary | ICD-10-CM | POA: Diagnosis not present

## 2020-05-04 DIAGNOSIS — K219 Gastro-esophageal reflux disease without esophagitis: Secondary | ICD-10-CM | POA: Diagnosis not present

## 2020-05-07 DIAGNOSIS — M9902 Segmental and somatic dysfunction of thoracic region: Secondary | ICD-10-CM | POA: Diagnosis not present

## 2020-05-07 DIAGNOSIS — M9901 Segmental and somatic dysfunction of cervical region: Secondary | ICD-10-CM | POA: Diagnosis not present

## 2020-05-07 DIAGNOSIS — M47812 Spondylosis without myelopathy or radiculopathy, cervical region: Secondary | ICD-10-CM | POA: Diagnosis not present

## 2020-05-07 DIAGNOSIS — M4004 Postural kyphosis, thoracic region: Secondary | ICD-10-CM | POA: Diagnosis not present

## 2020-05-18 ENCOUNTER — Inpatient Hospital Stay (HOSPITAL_COMMUNITY): Payer: Medicare Other | Attending: Hematology

## 2020-05-18 ENCOUNTER — Other Ambulatory Visit: Payer: Self-pay

## 2020-05-18 DIAGNOSIS — D801 Nonfamilial hypogammaglobulinemia: Secondary | ICD-10-CM

## 2020-05-18 DIAGNOSIS — C8338 Diffuse large B-cell lymphoma, lymph nodes of multiple sites: Secondary | ICD-10-CM | POA: Insufficient documentation

## 2020-05-18 DIAGNOSIS — D509 Iron deficiency anemia, unspecified: Secondary | ICD-10-CM | POA: Diagnosis not present

## 2020-05-18 LAB — CBC WITH DIFFERENTIAL/PLATELET
Abs Immature Granulocytes: 0.02 10*3/uL (ref 0.00–0.07)
Basophils Absolute: 0.1 10*3/uL (ref 0.0–0.1)
Basophils Relative: 1 %
Eosinophils Absolute: 0.2 10*3/uL (ref 0.0–0.5)
Eosinophils Relative: 2 %
HCT: 39.4 % (ref 36.0–46.0)
Hemoglobin: 12.2 g/dL (ref 12.0–15.0)
Immature Granulocytes: 0 %
Lymphocytes Relative: 25 %
Lymphs Abs: 1.7 10*3/uL (ref 0.7–4.0)
MCH: 31.1 pg (ref 26.0–34.0)
MCHC: 31 g/dL (ref 30.0–36.0)
MCV: 100.5 fL — ABNORMAL HIGH (ref 80.0–100.0)
Monocytes Absolute: 0.6 10*3/uL (ref 0.1–1.0)
Monocytes Relative: 9 %
Neutro Abs: 4.3 10*3/uL (ref 1.7–7.7)
Neutrophils Relative %: 63 %
Platelets: 181 10*3/uL (ref 150–400)
RBC: 3.92 MIL/uL (ref 3.87–5.11)
RDW: 12.9 % (ref 11.5–15.5)
WBC: 6.9 10*3/uL (ref 4.0–10.5)
nRBC: 0 % (ref 0.0–0.2)

## 2020-05-18 LAB — IRON AND TIBC
Iron: 79 ug/dL (ref 28–170)
Saturation Ratios: 40 % — ABNORMAL HIGH (ref 10.4–31.8)
TIBC: 199 ug/dL — ABNORMAL LOW (ref 250–450)
UIBC: 120 ug/dL

## 2020-05-18 LAB — COMPREHENSIVE METABOLIC PANEL
ALT: 10 U/L (ref 0–44)
AST: 15 U/L (ref 15–41)
Albumin: 3.4 g/dL — ABNORMAL LOW (ref 3.5–5.0)
Alkaline Phosphatase: 110 U/L (ref 38–126)
Anion gap: 8 (ref 5–15)
BUN: 15 mg/dL (ref 8–23)
CO2: 30 mmol/L (ref 22–32)
Calcium: 8.4 mg/dL — ABNORMAL LOW (ref 8.9–10.3)
Chloride: 102 mmol/L (ref 98–111)
Creatinine, Ser: 1.11 mg/dL — ABNORMAL HIGH (ref 0.44–1.00)
GFR, Estimated: 55 mL/min — ABNORMAL LOW (ref >60)
Glucose, Bld: 88 mg/dL (ref 70–99)
Potassium: 3.5 mmol/L (ref 3.5–5.1)
Sodium: 140 mmol/L (ref 135–145)
Total Bilirubin: 0.6 mg/dL (ref 0.3–1.2)
Total Protein: 6.6 g/dL (ref 6.5–8.1)

## 2020-05-18 LAB — LACTATE DEHYDROGENASE: LDH: 163 U/L (ref 98–192)

## 2020-05-18 LAB — FERRITIN: Ferritin: 99 ng/mL (ref 11–307)

## 2020-05-19 ENCOUNTER — Encounter (HOSPITAL_COMMUNITY): Payer: Self-pay

## 2020-05-19 ENCOUNTER — Inpatient Hospital Stay (HOSPITAL_COMMUNITY): Payer: Medicare Other

## 2020-05-19 VITALS — BP 147/63 | HR 64 | Temp 97.3°F | Resp 18 | Wt 138.4 lb

## 2020-05-19 DIAGNOSIS — C8338 Diffuse large B-cell lymphoma, lymph nodes of multiple sites: Secondary | ICD-10-CM | POA: Diagnosis not present

## 2020-05-19 DIAGNOSIS — D509 Iron deficiency anemia, unspecified: Secondary | ICD-10-CM | POA: Diagnosis not present

## 2020-05-19 DIAGNOSIS — D801 Nonfamilial hypogammaglobulinemia: Secondary | ICD-10-CM | POA: Diagnosis not present

## 2020-05-19 LAB — IGG, IGA, IGM
IgA: <5 mg/dL — ABNORMAL LOW (ref 87–352)
IgG (Immunoglobin G), Serum: 1334 mg/dL (ref 586–1602)
IgM (Immunoglobulin M), Srm: 97 mg/dL (ref 26–217)

## 2020-05-19 MED ORDER — DIPHENHYDRAMINE HCL 25 MG PO CAPS: 25.0000 mg | ORAL_CAPSULE | Freq: Once | ORAL | Status: DC

## 2020-05-19 MED ORDER — IMMUNE GLOBULIN (HUMAN) 10 GM/100ML IV SOLN
30.0000 g | Freq: Once | INTRAVENOUS | Status: AC
Start: 2020-05-19 — End: 2020-05-19
  Administered 2020-05-19: 30 g via INTRAVENOUS
  Filled 2020-05-19: qty 300

## 2020-05-19 MED ORDER — DEXTROSE 5 % IV SOLN: INTRAVENOUS | Status: DC

## 2020-05-19 MED ORDER — ACETAMINOPHEN 325 MG PO TABS: 650.0000 mg | ORAL_TABLET | Freq: Once | ORAL | Status: DC

## 2020-05-19 NOTE — Progress Notes (Signed)
Treatment given per orders. Patient tolerated it well without problems. Vitals stable and discharged home from clinic ambulatory. Follow up as scheduled.  

## 2020-05-19 NOTE — Patient Instructions (Signed)
[facility]  Discharge Instructions: Thank you for choosing [facility] to provide your oncology and hematology care.  If you have a lab appointment with the [facility], please come in thru the Main Entrance and check in at the main information desk.  Wear comfortable clothing and clothing appropriate for easy access to any Portacath or PICC line.   We strive to give you quality time with your provider. You may need to reschedule your appointment if you arrive late (15 or more minutes).  Arriving late affects you and other patients whose appointments are after yours.  Also, if you miss three or more appointments without notifying the office, you may be dismissed from the clinic at the provider's discretion.      For prescription refill requests, have your pharmacy contact our office and allow 72 hours for refills to be completed.    Today you received the following immunotherapy agents , IVIG     To help prevent nausea and vomiting after your treatment, we encourage you to take your nausea medication as directed.  BELOW ARE SYMPTOMS THAT SHOULD BE REPORTED IMMEDIATELY: . *FEVER GREATER THAN 100.4 F (38 C) OR HIGHER . *CHILLS OR SWEATING . *NAUSEA AND VOMITING THAT IS NOT CONTROLLED WITH YOUR NAUSEA MEDICATION . *UNUSUAL SHORTNESS OF BREATH . *UNUSUAL BRUISING OR BLEEDING . *URINARY PROBLEMS (pain or burning when urinating, or frequent urination) . *BOWEL PROBLEMS (unusual diarrhea, constipation, pain near the anus) . TENDERNESS IN MOUTH AND THROAT WITH OR WITHOUT PRESENCE OF ULCERS (sore throat, sores in mouth, or a toothache) . UNUSUAL RASH, SWELLING OR PAIN  . UNUSUAL VAGINAL DISCHARGE OR ITCHING   Items with * indicate a potential emergency and should be followed up as soon as possible or go to the Emergency Department if any problems should occur.  Please show the CHEMOTHERAPY ALERT CARD or IMMUNOTHERAPY ALERT CARD at check-in to the Emergency Department  and triage nurse.  Should you have questions after your visit or need to cancel or reschedule your appointment, please contact [facility] [phone]  and follow the prompts.  Office hours are 8:00 a.m. to 4:30 p.m. Monday - Friday. Please note that voicemails left after 4:00 p.m. may not be returned until the following business day.  We are closed weekends and major holidays. You have access to a nurse at all times for urgent questions. Please call the main number to the clinic [phone] and follow the prompts.  For any non-urgent questions, you may also contact your provider using MyChart. We now offer e-Visits for anyone 63 and older to request care online for non-urgent symptoms. For details visit [email or web address].   Also download the MyChart app! Go to the app store, search "MyChart", open the app, select [facility], and log in with your MyChart username and password.  Due to Covid, a mask is required upon entering the hospital/clinic. If you do not have a mask, one will be given to you upon arrival. For doctor visits, patients may have 1 support person aged 71 or older with them. For treatment visits, patients cannot have anyone with them due to current Covid guidelines and our immunocompromised population.

## 2020-05-27 DIAGNOSIS — M4004 Postural kyphosis, thoracic region: Secondary | ICD-10-CM | POA: Diagnosis not present

## 2020-05-27 DIAGNOSIS — M47812 Spondylosis without myelopathy or radiculopathy, cervical region: Secondary | ICD-10-CM | POA: Diagnosis not present

## 2020-05-27 DIAGNOSIS — M9902 Segmental and somatic dysfunction of thoracic region: Secondary | ICD-10-CM | POA: Diagnosis not present

## 2020-05-27 DIAGNOSIS — M9901 Segmental and somatic dysfunction of cervical region: Secondary | ICD-10-CM | POA: Diagnosis not present

## 2020-06-12 DIAGNOSIS — M9902 Segmental and somatic dysfunction of thoracic region: Secondary | ICD-10-CM | POA: Diagnosis not present

## 2020-06-12 DIAGNOSIS — M47812 Spondylosis without myelopathy or radiculopathy, cervical region: Secondary | ICD-10-CM | POA: Diagnosis not present

## 2020-06-12 DIAGNOSIS — M4004 Postural kyphosis, thoracic region: Secondary | ICD-10-CM | POA: Diagnosis not present

## 2020-06-12 DIAGNOSIS — M9901 Segmental and somatic dysfunction of cervical region: Secondary | ICD-10-CM | POA: Diagnosis not present

## 2020-06-18 ENCOUNTER — Other Ambulatory Visit (HOSPITAL_COMMUNITY): Payer: Self-pay | Admitting: Internal Medicine

## 2020-06-18 ENCOUNTER — Ambulatory Visit (HOSPITAL_COMMUNITY)
Admission: RE | Admit: 2020-06-18 | Discharge: 2020-06-18 | Disposition: A | Discharge status: Home / Self Care | Payer: Medicare Other | Source: Ambulatory Visit | Attending: Internal Medicine | Admitting: Internal Medicine

## 2020-06-18 DIAGNOSIS — R053 Chronic cough: Secondary | ICD-10-CM | POA: Diagnosis not present

## 2020-06-18 DIAGNOSIS — R059 Cough, unspecified: Secondary | ICD-10-CM

## 2020-06-18 DIAGNOSIS — Z6823 Body mass index (BMI) 23.0-23.9, adult: Secondary | ICD-10-CM | POA: Diagnosis not present

## 2020-06-18 DIAGNOSIS — N182 Chronic kidney disease, stage 2 (mild): Secondary | ICD-10-CM | POA: Diagnosis not present

## 2020-06-18 DIAGNOSIS — N3000 Acute cystitis without hematuria: Secondary | ICD-10-CM | POA: Diagnosis not present

## 2020-06-18 DIAGNOSIS — R7303 Prediabetes: Secondary | ICD-10-CM | POA: Diagnosis not present

## 2020-06-18 DIAGNOSIS — J45909 Unspecified asthma, uncomplicated: Secondary | ICD-10-CM | POA: Diagnosis not present

## 2020-06-18 DIAGNOSIS — J439 Emphysema, unspecified: Secondary | ICD-10-CM | POA: Diagnosis not present

## 2020-06-18 DIAGNOSIS — R300 Dysuria: Secondary | ICD-10-CM | POA: Diagnosis not present

## 2020-06-26 DIAGNOSIS — M4004 Postural kyphosis, thoracic region: Secondary | ICD-10-CM | POA: Diagnosis not present

## 2020-06-26 DIAGNOSIS — M9902 Segmental and somatic dysfunction of thoracic region: Secondary | ICD-10-CM | POA: Diagnosis not present

## 2020-06-26 DIAGNOSIS — M47812 Spondylosis without myelopathy or radiculopathy, cervical region: Secondary | ICD-10-CM | POA: Diagnosis not present

## 2020-06-26 DIAGNOSIS — M9901 Segmental and somatic dysfunction of cervical region: Secondary | ICD-10-CM | POA: Diagnosis not present

## 2020-06-30 IMAGING — MG DIGITAL SCREENING BILAT W/ TOMO W/ CAD
6 of 10 series · 6 of 30 positions shown · non-contrast
Comparison: Previous exam(s).

CLINICAL DATA: Screening.

EXAM:
DIGITAL SCREENING BILATERAL MAMMOGRAM WITH TOMO AND CAD

[L MLO synth-2D (1 of 2)]
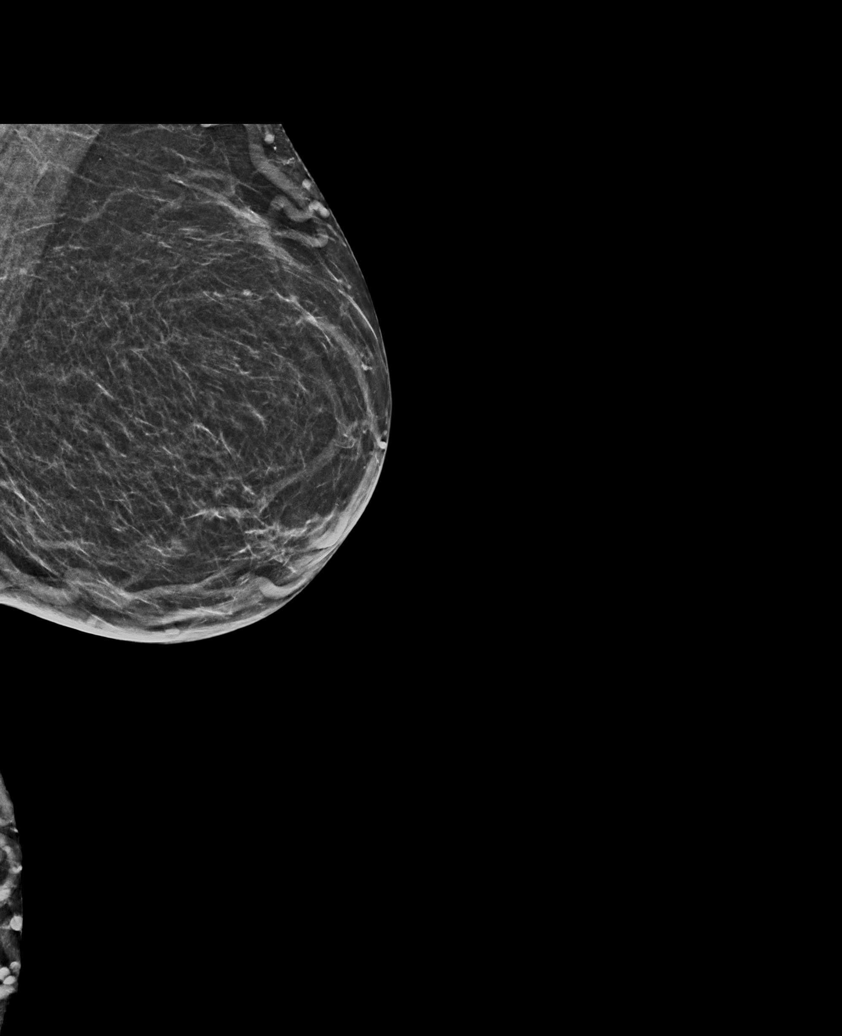

[L CC synth-2D]
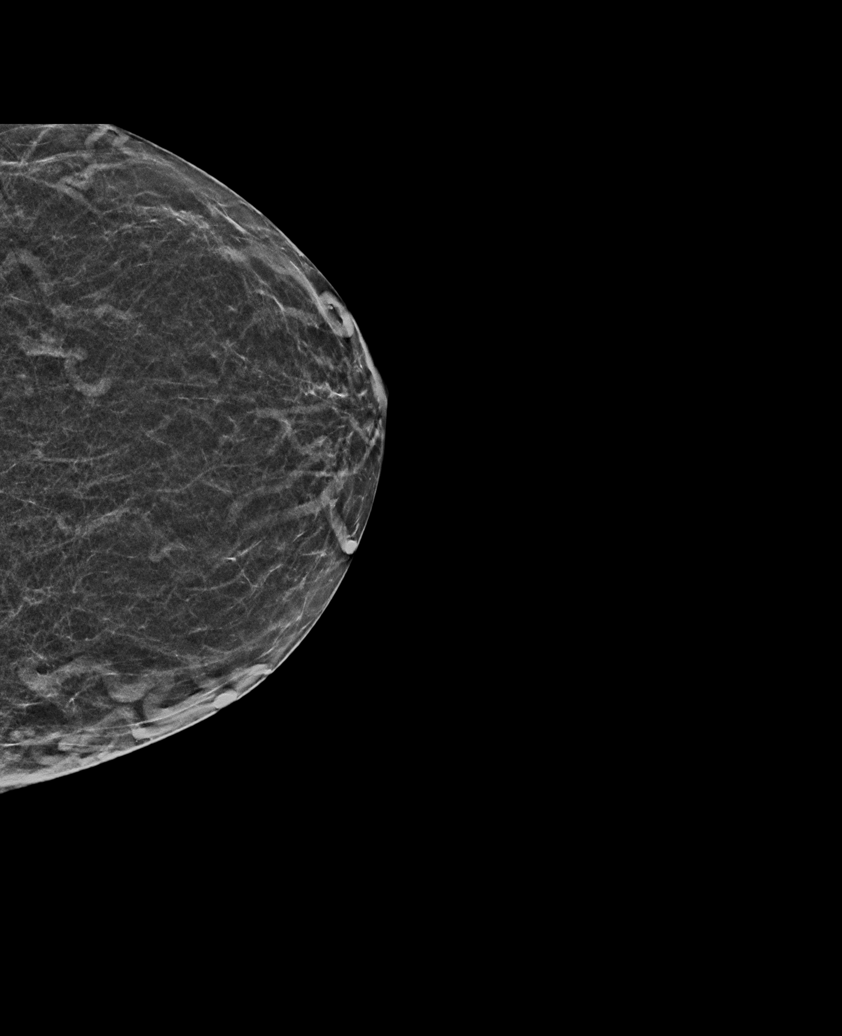

[L MLO synth-2D (2 of 2)]
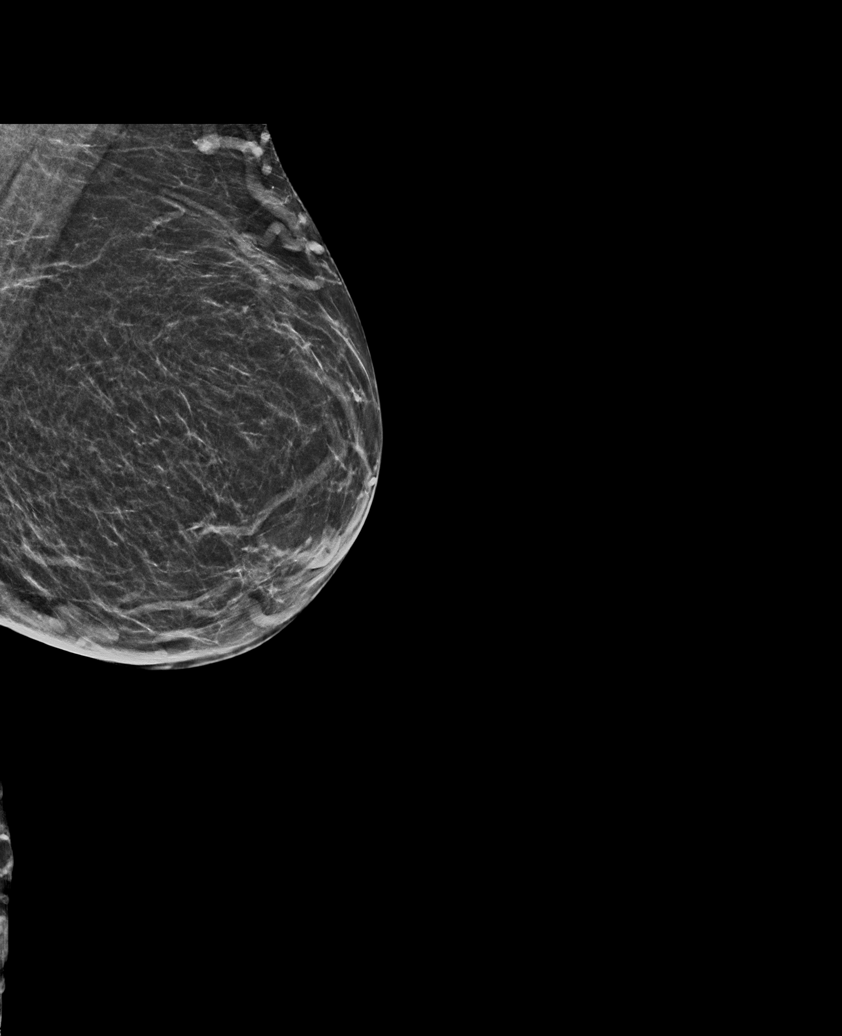

[R CC synth-2D]
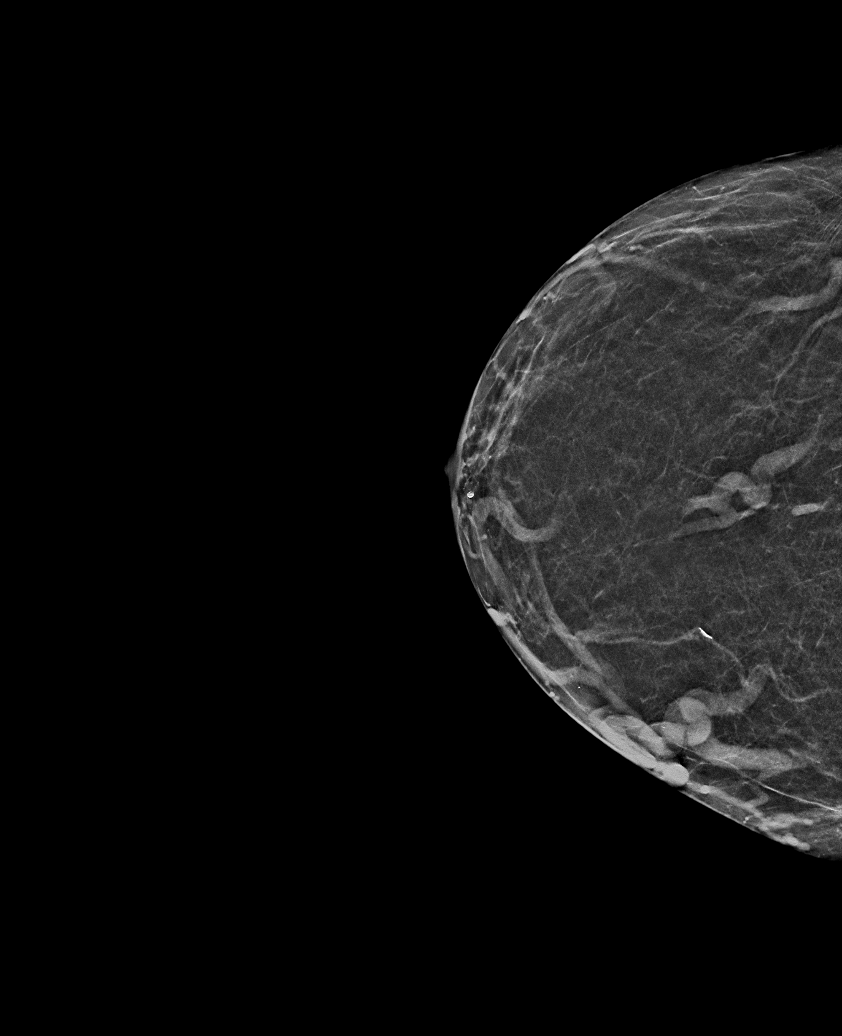

[R MLO synth-2D]
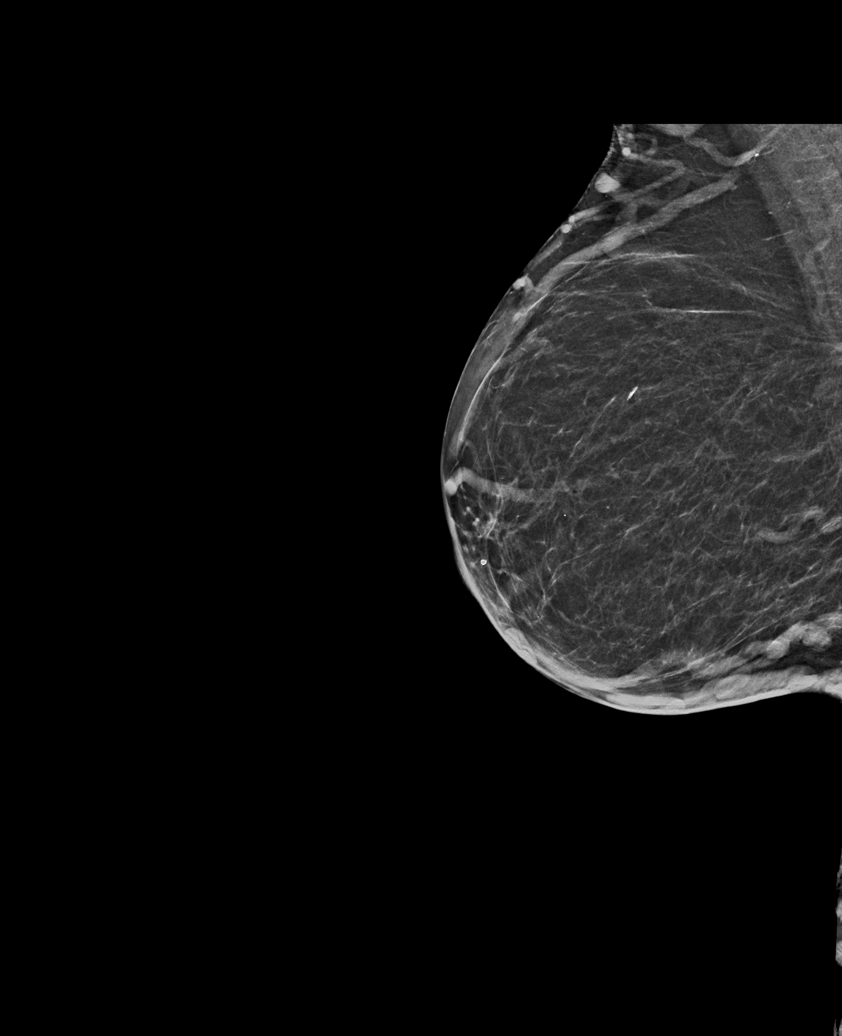

[L CC tomo · tomo slice 23/44.0]
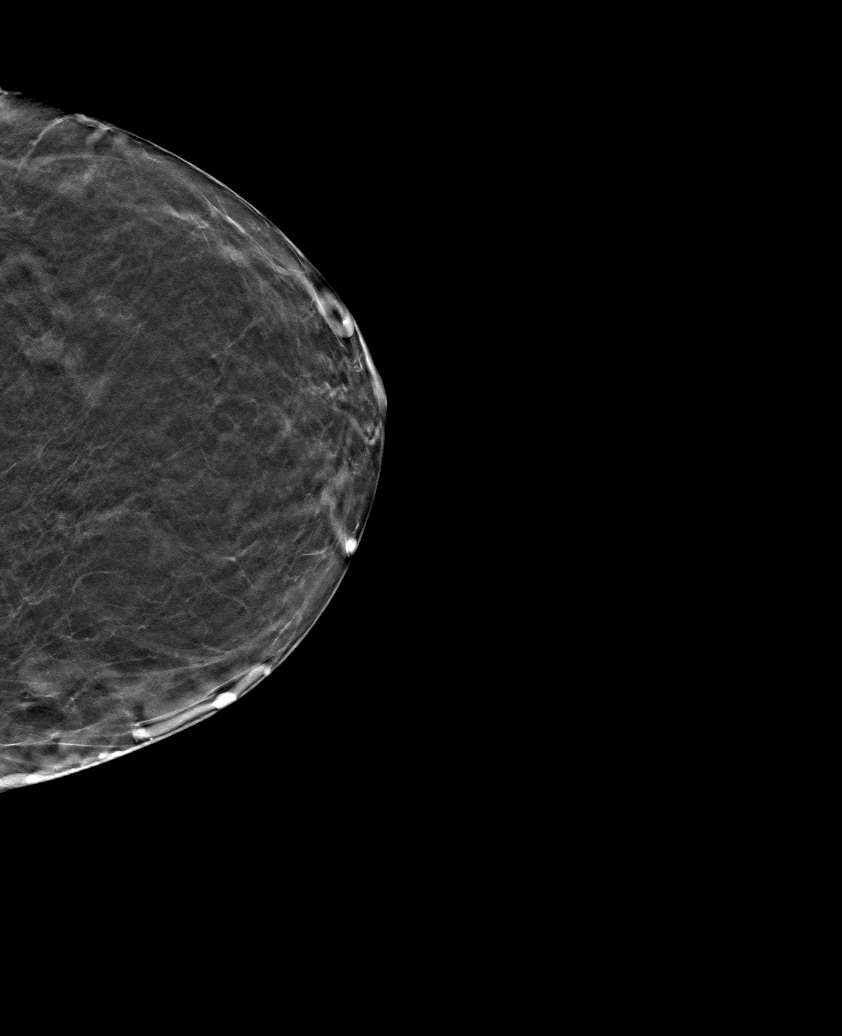

[6 of 30 positions shown; findings below may reference images not displayed]

ACR Breast Density Category b: There are scattered areas of
fibroglandular density.
FINDINGS: There are no findings suspicious for malignancy. Images were
processed with CAD.
IMPRESSION: No mammographic evidence of malignancy. A result letter of this
screening mammogram will be mailed directly to the patient.

RECOMMENDATION:
Screening mammogram in one year. (Code:[id number])

BI-RADS CATEGORY  1: Negative.

## 2020-07-09 ENCOUNTER — Other Ambulatory Visit (HOSPITAL_COMMUNITY): Payer: Self-pay | Admitting: Hematology

## 2020-07-09 DIAGNOSIS — D801 Nonfamilial hypogammaglobulinemia: Secondary | ICD-10-CM

## 2020-07-13 ENCOUNTER — Inpatient Hospital Stay (HOSPITAL_COMMUNITY): Payer: Medicare Other | Attending: Hematology

## 2020-07-13 ENCOUNTER — Other Ambulatory Visit: Payer: Self-pay

## 2020-07-13 DIAGNOSIS — C8338 Diffuse large B-cell lymphoma, lymph nodes of multiple sites: Secondary | ICD-10-CM | POA: Diagnosis not present

## 2020-07-13 DIAGNOSIS — D509 Iron deficiency anemia, unspecified: Secondary | ICD-10-CM | POA: Diagnosis not present

## 2020-07-13 DIAGNOSIS — D801 Nonfamilial hypogammaglobulinemia: Secondary | ICD-10-CM | POA: Diagnosis present

## 2020-07-13 LAB — COMPREHENSIVE METABOLIC PANEL
ALT: 10 U/L (ref 0–44)
AST: 16 U/L (ref 15–41)
Albumin: 3.5 g/dL (ref 3.5–5.0)
Alkaline Phosphatase: 114 U/L (ref 38–126)
Anion gap: 9 (ref 5–15)
BUN: 12 mg/dL (ref 8–23)
CO2: 28 mmol/L (ref 22–32)
Calcium: 8.2 mg/dL — ABNORMAL LOW (ref 8.9–10.3)
Chloride: 102 mmol/L (ref 98–111)
Creatinine, Ser: 0.91 mg/dL (ref 0.44–1.00)
GFR, Estimated: >60 mL/min (ref >60)
Glucose, Bld: 94 mg/dL (ref 70–99)
Potassium: 3.7 mmol/L (ref 3.5–5.1)
Sodium: 139 mmol/L (ref 135–145)
Total Bilirubin: 0.3 mg/dL (ref 0.3–1.2)
Total Protein: 6.7 g/dL (ref 6.5–8.1)

## 2020-07-13 LAB — CBC WITH DIFFERENTIAL/PLATELET
Abs Immature Granulocytes: 0.01 10*3/uL (ref 0.00–0.07)
Basophils Absolute: 0 10*3/uL (ref 0.0–0.1)
Basophils Relative: 1 %
Eosinophils Absolute: 0.2 10*3/uL (ref 0.0–0.5)
Eosinophils Relative: 3 %
HCT: 40 % (ref 36.0–46.0)
Hemoglobin: 12.9 g/dL (ref 12.0–15.0)
Immature Granulocytes: 0 %
Lymphocytes Relative: 19 %
Lymphs Abs: 1.2 10*3/uL (ref 0.7–4.0)
MCH: 32.4 pg (ref 26.0–34.0)
MCHC: 32.3 g/dL (ref 30.0–36.0)
MCV: 100.5 fL — ABNORMAL HIGH (ref 80.0–100.0)
Monocytes Absolute: 0.6 10*3/uL (ref 0.1–1.0)
Monocytes Relative: 9 %
Neutro Abs: 4.2 10*3/uL (ref 1.7–7.7)
Neutrophils Relative %: 68 %
Platelets: 158 10*3/uL (ref 150–400)
RBC: 3.98 MIL/uL (ref 3.87–5.11)
RDW: 12.2 % (ref 11.5–15.5)
WBC: 6.3 10*3/uL (ref 4.0–10.5)
nRBC: 0 % (ref 0.0–0.2)

## 2020-07-13 LAB — IRON AND TIBC
Iron: 72 ug/dL (ref 28–170)
Saturation Ratios: 37 % — ABNORMAL HIGH (ref 10.4–31.8)
TIBC: 195 ug/dL — ABNORMAL LOW (ref 250–450)
UIBC: 123 ug/dL

## 2020-07-13 LAB — FERRITIN: Ferritin: 105 ng/mL (ref 11–307)

## 2020-07-13 LAB — LACTATE DEHYDROGENASE: LDH: 164 U/L (ref 98–192)

## 2020-07-13 NOTE — Progress Notes (Signed)
[address], [place] [address]   CLINIC:  Medical Oncology/Hematology  PCP:  Sharilyn Sites, [place] / [place] [address]  [phone]  REASON FOR VISIT:  Follow-up for acquired hypogammaglobulinemia and DLBCL  PRIOR THERAPY: R-CHOP x 4 cycles with intrathecal methotrexate during cycles 2 & 4  CURRENT THERAPY: IVIG every 8 weeks; intermittent Feraheme last on 12/13/2019  Virtual Visit via Video Note  I connected with Arletha Pili on @TODAY @ at  9:15 AM EDT by a video enabled telemedicine application and verified that I am speaking with the correct person using two identifiers.  Location: Patient: Arletha Pili Provider: Derek Jack, MD   I discussed the limitations of evaluation and management by telemedicine and the availability of in person appointments. The patient expressed understanding and agreed to proceed.  INTERVAL HISTORY:  Ms. LOWANDA CASHAW, a 65 y.o. female, returns for routine follow-up for her hypogammaglobulinemia and DLBCL. Sani was last seen on 03/24/2020.  Today she reports feeling well. She reports a recent pseudomonas sinus infection which was treated with a course of antibiotics at the end of June; she reports that she has had several similar sinus infections previously. She also reports a UTI at the end of May which was also treated with a course of antibiotics. She denies any other recent infections, fevers, night sweats, or unexpected weight loss.   REVIEW OF SYSTEMS:  Review of Systems  Constitutional:  Negative for fever and unexpected weight change.   PAST MEDICAL/SURGICAL HISTORY:  Past Medical History:  Diagnosis Date   Allergic rhinitis    Anxiety    Asthma    B12 deficiency 01/18/2015   Cataract    Cavitary lung disease    Chronic sinusitis    COPD (chronic obstructive pulmonary disease) (HCC)    Depression    Diffuse large B-cell lymphoma (HCC)    DVT (deep venous  thrombosis) ([facility]) 09/25/2014   Essential hypertension    GERD (gastroesophageal reflux disease)    Hematuria 04/07/2016   History of pneumonia 01/2013   Hyperlipidemia    Hypogammaglobulinemia, acquired ([facility])    Iron deficiency anemia 01/18/2015   Myocardial infarction ([facility]) 2011   Narcolepsy without cataplexy(347.00) 01/18/2015   Orofacial dyskinesia 04/22/2015   Osteoporosis    Peripheral neuropathy due to chemotherapy ([facility]) 05/02/2016   Sleep apnea    SVC syndrome 09/25/2014   Past Surgical History:  Procedure Laterality Date   ABDOMINAL HYSTERECTOMY     Fibroids   BASAL CELL CARCINOMA EXCISION  03/2011   scalp   BREAST SURGERY Right    biopsy   CATARACT EXTRACTION W/PHACO Left 11/17/2014   Procedure: CATARACT EXTRACTION PHACO AND INTRAOCULAR LENS PLACEMENT LEFT EYE;  Surgeon: Tonny Branch, MD;  Location: [facility];  Service: Ophthalmology;  Laterality: Left;  CDE:5.60   CATARACT EXTRACTION W/PHACO Right 12/15/2014   Procedure: CATARACT EXTRACTION PHACO AND INTRAOCULAR LENS PLACEMENT RIGHT EYE CDE=5.16;  Surgeon: Tonny Branch, MD;  Location: [facility];  Service: Ophthalmology;  Laterality: Right;   COLONOSCOPY N/A 11/14/2012   Procedure: COLONOSCOPY;  Surgeon: Rogene Houston, MD;  Location: [facility];  Service: Endoscopy;  Laterality: N/A;  830   COLONOSCOPY WITH PROPOFOL N/A 01/12/2018   Procedure: COLONOSCOPY WITH PROPOFOL;  Surgeon: Rogene Houston, MD;  Location: [facility];  Service: Endoscopy;  Laterality: N/A;  7:30   EYE SURGERY     NASAL SINUS SURGERY     NECK  SURGERY     x 2    PERIPHERALLY INSERTED CENTRAL CATHETER INSERTION     PICC Removal     POLYPECTOMY  01/12/2018   Procedure: POLYPECTOMY;  Surgeon: Rogene Houston, MD;  Location: [facility];  Service: Endoscopy;;  colon   PORT-A-CATH REMOVAL     PORTACATH PLACEMENT  7/11   Removed 6/12   TRACHEOSTOMY     VESICOVAGINAL FISTULA CLOSURE W/ TAH      SOCIAL HISTORY:  Social History   Socioeconomic History    Marital status: Divorced    Spouse name: Not on file   Number of children: 3   Years of education: 12   Highest education level: Not on file  Occupational History   Occupation: teller    Comment: [employer]  Tobacco Use   Smoking status: Never   Smokeless tobacco: Never  Vaping Use   Vaping Use: Never used  Substance and Sexual Activity   Alcohol use: No   Drug use: No   Sexual activity: Not on file    Comment: hyst  Other Topics Concern   Not on file  Social History Narrative   Originally from [place]. Always lived in [place]. Prior travel to [place]. 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 She has 1 dog currently. She has 1 conures (small parrots). No mold exposure in her home. 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 No hot tub exposure. She enjoys sewing & quilting.    Social Determinants of Health   Financial Resource Strain: Medium Risk   Difficulty of Paying Living Expenses: Somewhat hard  Food Insecurity: No Food Insecurity   Worried About Charity fundraiser in the Last Year: Never true   Ran Out of Food in the Last Year: Never true  Transportation Needs: No Transportation Needs   Lack of Transportation (Medical): No   Lack of Transportation (Non-Medical): No  Physical Activity: Insufficiently Active   Days of Exercise per Week: 2 days   Minutes of Exercise per Session: 10 min  Stress: No Stress Concern Present   Feeling of Stress : Not at all  Social Connections: Moderately Integrated   Frequency of Communication with Friends and Family: More than three times a week   Frequency of Social Gatherings with Friends and Family: More than three times a week   Attends Religious Services: More than 4 times per year   Active Member of [employer] or Organizations: Yes   Attends Music therapist: More than 4 times per year   Marital Status: Divorced  Human resources officer Violence: Not At Risk   Fear of  Current or Ex-Partner: No   Emotionally Abused: No   Physically Abused: No   Sexually Abused: No    FAMILY HISTORY:  Family History  Problem Relation Age of Onset   Emphysema Mother    Stroke Mother    COPD Mother    Heart disease Mother        died in sleep  49   Allergies Father    Asthma Father        as a child   Arthritis Father    Parkinson's disease Father 39   Leukemia Maternal Grandmother    Cancer Maternal Grandmother        Leukemia   Diabetes Brother    Heart attack Brother    Hypertension Brother    Heart  disease Brother 43       stents   Hypertension Sister    Hypertension Brother     CURRENT MEDICATIONS:  Current Outpatient Medications  Medication Sig Dispense Refill   acetaminophen (TYLENOL) 500 MG tablet Take 1,000 mg by mouth every 6 (six) hours as needed for moderate pain or headache.      acyclovir (ZOVIRAX) 400 MG tablet TAKE ONE TABLET BY MOUTH TWICE DAILY. 60 tablet 0   albuterol (PROAIR HFA) 108 (90 Base) MCG/ACT inhaler Inhale 2 puffs into the lungs every 6 (six) hours as needed. (Patient taking differently: Inhale 2 puffs into the lungs every 6 (six) hours as needed for wheezing or shortness of breath.) 8.5 g 2   albuterol (PROVENTIL) (2.5 MG/3ML) 0.083% nebulizer solution Take 2.5 mg by nebulization every 4 (four) hours as needed for wheezing or shortness of breath.     amoxicillin (AMOXIL) 500 MG capsule Take 1 capsule (500 mg total) by mouth 3 (three) times daily. 21 capsule 0   amphetamine-dextroamphetamine (ADDERALL) 10 MG tablet Take 10 mg by mouth every morning.   0   Armodafinil 150 MG tablet Take 150 mg by mouth every morning.      budesonide-formoterol (SYMBICORT) 160-4.5 MCG/ACT inhaler Inhale 2 puffs into the lungs 2 (two) times daily. 1 Inhaler 5   Cholecalciferol (VITAMIN D3 PO) Take 1 capsule by mouth at bedtime.     citalopram (CELEXA) 20 MG tablet Take 20 mg by mouth every morning.     cyanocobalamin 1000 MCG tablet Take 1,000 mcg  by mouth daily.     furosemide (LASIX) 20 MG tablet Take 1 tablet (20 mg total) by mouth as needed for fluid. 90 tablet 1   gabapentin (NEURONTIN) 600 MG tablet 300 mg 2 (two) times daily.      mupirocin ointment (BACTROBAN) 2 % Apply 1 application topically 3 (three) times daily.     olmesartan (BENICAR) 40 MG tablet Take 20-40 mg by mouth daily.     omeprazole (PRILOSEC) 20 MG capsule TAKE ONE CAPSULE BY MOUTH TWICE DAILY BEFORE A MEAL. 180 capsule 0   predniSONE (DELTASONE) 5 MG tablet TAKE 1 TABLET BY MOUTH ONCE A DAY. 30 tablet 0   simvastatin (ZOCOR) 80 MG tablet Take 1 tablet (80 mg total) by mouth at bedtime. 90 tablet 3   zafirlukast (ACCOLATE) 20 MG tablet TAKE 1 TABLET BY MOUTH TWICE DAILY. 60 tablet 0   No current facility-administered medications for this visit.   Facility-Administered Medications Ordered in Other Visits  Medication Dose Route Frequency Provider Last Rate Last Admin   0.9 %  sodium chloride infusion   Intravenous Once Kefalas, Thomas S, PA-C       0.9 %  sodium chloride infusion   Intravenous Once Kefalas, Thomas S, PA-C       acetaminophen (TYLENOL) tablet 650 mg  650 mg Oral Q6H PRN Kefalas, Thomas S, PA-C       acetaminophen (TYLENOL) tablet 650 mg  650 mg Oral Q6H PRN Kefalas, Manon Hilding, PA-C       dextrose 5 % solution   Intravenous Continuous Kefalas, Manon Hilding, PA-C   Stopped at 08/25/15 1400   dextrose 5 % solution   Intravenous Continuous Kefalas, Manon Hilding, PA-C   Stopped at 07/13/16 1350   dextrose 5 % solution   Intravenous Continuous Holley Bouche, NP   Stopped at 02/28/17 1230   diphenhydrAMINE (BENADRYL) capsule 25 mg  25 mg Oral Once Robynn Pane  S, PA-C       diphenhydrAMINE (BENADRYL) capsule 25 mg  25 mg Oral Once Kefalas, Thomas S, PA-C       sodium chloride 0.9 % injection 10 mL  10 mL Intracatheter PRN Baird Cancer, PA-C   10 mL at 08/25/15 1125    ALLERGIES:  Allergies  Allergen Reactions   Meperidine Hcl Anaphylaxis    Montelukast Sodium Hives and Rash    Performance status (ECOG): 1 - Symptomatic but completely ambulatory  There were no vitals filed for this visit. Wt Readings from Last 3 Encounters:  05/19/20 138 lb 6.4 oz (62.8 kg)  03/24/20 139 lb 6.4 oz (63.2 kg)  01/30/20 136 lb (61.7 kg)    LABORATORY DATA:  I have reviewed the labs as listed.  CBC Latest Ref Rng & Units 07/13/2020 05/18/2020 03/23/2020  WBC 4.0 - 10.5 K/uL 6.3 6.9 8.3  Hemoglobin 12.0 - 15.0 g/dL 12.9 12.2 13.5  Hematocrit 36.0 - 46.0 % 40.0 39.4 41.9  Platelets 150 - 400 K/uL 158 181 148(L)   CMP Latest Ref Rng & Units 07/13/2020 05/18/2020 03/23/2020  Glucose 70 - 99 mg/dL 94 88 82  BUN 8 - 23 mg/dL 12 15 12   Creatinine 0.44 - 1.00 mg/dL 0.91 1.11(H) 0.95  Sodium 135 - 145 mmol/L 139 140 140  Potassium 3.5 - 5.1 mmol/L 3.7 3.5 3.6  Chloride 98 - 111 mmol/L 102 102 102  CO2 22 - 32 mmol/L 28 30 29   Calcium 8.9 - 10.3 mg/dL 8.2(L) 8.4(L) 8.5(L)  Total Protein 6.5 - 8.1 g/dL 6.7 6.6 6.8  Total Bilirubin 0.3 - 1.2 mg/dL 0.3 0.6 0.5  Alkaline Phos 38 - 126 U/L 114 110 108  AST 15 - 41 U/L 16 15 17   ALT 0 - 44 U/L 10 10 11       Component Value Date/Time   RBC 3.98 07/13/2020 0931   MCV 100.5 (H) 07/13/2020 0931   MCH 32.4 07/13/2020 0931   MCHC 32.3 07/13/2020 0931   RDW 12.2 07/13/2020 0931   LYMPHSABS 1.2 07/13/2020 0931   MONOABS 0.6 07/13/2020 0931   EOSABS 0.2 07/13/2020 0931   BASOSABS 0.0 07/13/2020 0931    DIAGNOSTIC IMAGING:  I have independently reviewed the scans and discussed with the patient. DG Chest 2 View  Result Date: 06/18/2020 CLINICAL DATA:  Chronic cough, asthma, COPD EXAM: CHEST - 2 VIEW COMPARISON:  07/04/2019 FINDINGS: Frontal and lateral views of the chest demonstrate an unremarkable cardiac silhouette. Lungs are hyperinflated with diffuse parenchymal scarring compatible with emphysema. No airspace disease, effusion, or pneumothorax. No acute bony abnormalities. IMPRESSION: 1. Stable  emphysema, no acute process. Electronically Signed   By: Randa Ngo M.D.   On: 06/18/2020 12:47     ASSESSMENT:  1. Acquired hypogammaglobulinemia: -She has been receiving IVIG every 8 weeks since January 2020. -She does have severely low IgA levels.  However he does not have any reactions.   2.  DLBCL, stage IVb: -She was diagnosed 07/08/2009.  Treated with 4 cycles of R-CHOP with intrathecal methotrexate during cycle 2 and 4.  She developed a Pseudomonas sepsis after cycles 2 and 4 and therapy was stopped after 4 cycles.  She also received 2 years of maintenance rituximab. -Last PET scan on 03/20/2017 did not show any evidence of lymphoma.   3.  Health maintenance: -Mammogram on 11/08/2018, BI-RADS Category 1.   PLAN:  1. Acquired hypogammaglobulinemia: - She reported sinus infection in June and took Levaquin which  improved.  She also had a UTI around the same time and took Levaquin which also helped.  No other infections noted. - No ER visits or recent hospitalizations. - I reviewed her serum immunoglobulin levels which showed IgG of 1186.  LFTs-CBC was grossly within normal limits. - She will continue IVIG every 8 weeks.  RTC 4 months for follow-up with repeat labs.   2.  DLBCL, stage IVb: - She does not have any fevers, night sweats or weight loss.  LDH is normal.   3.  Normocytic anemia: - Last Feraheme on 12/13/2019.  Ferritin is 105 and percent saturation is 37.  Hemoglobin is 12.9.  No indication for parenteral iron therapy.  Orders placed this encounter:  No orders of the defined types were placed in this encounter.  I provided 20 minutes of non-face-to-face time during this encounter.  Derek Jack, MD [facility] [phone]   I, Thana Ates, am acting as a scribe for Dr. Derek Jack.  I, Derek Jack MD, have reviewed the above documentation for accuracy and completeness, and I agree with the above.

## 2020-07-14 ENCOUNTER — Inpatient Hospital Stay (HOSPITAL_BASED_OUTPATIENT_CLINIC_OR_DEPARTMENT_OTHER): Payer: Medicare Other | Admitting: Hematology

## 2020-07-14 ENCOUNTER — Inpatient Hospital Stay (HOSPITAL_COMMUNITY): Payer: Medicare Other

## 2020-07-14 ENCOUNTER — Encounter (HOSPITAL_COMMUNITY): Payer: Self-pay | Admitting: Hematology

## 2020-07-14 VITALS — BP 144/69 | HR 87 | Temp 97.1°F | Resp 18 | Wt 136.6 lb

## 2020-07-14 VITALS — BP 138/81 | HR 73 | Temp 97.2°F | Resp 18

## 2020-07-14 DIAGNOSIS — D801 Nonfamilial hypogammaglobulinemia: Secondary | ICD-10-CM

## 2020-07-14 DIAGNOSIS — D509 Iron deficiency anemia, unspecified: Secondary | ICD-10-CM | POA: Diagnosis not present

## 2020-07-14 DIAGNOSIS — C8338 Diffuse large B-cell lymphoma, lymph nodes of multiple sites: Secondary | ICD-10-CM | POA: Diagnosis not present

## 2020-07-14 LAB — IGG, IGA, IGM
IgA: <5 mg/dL — ABNORMAL LOW (ref 87–352)
IgG (Immunoglobin G), Serum: 1186 mg/dL (ref 586–1602)
IgM (Immunoglobulin M), Srm: 73 mg/dL (ref 26–217)

## 2020-07-14 MED ORDER — DEXTROSE 5 % IV SOLN: INTRAVENOUS | Status: DC

## 2020-07-14 MED ORDER — IMMUNE GLOBULIN (HUMAN) 10 GM/100ML IV SOLN
30.0000 g | Freq: Once | INTRAVENOUS | Status: AC
Start: 2020-07-14 — End: 2020-07-14
  Administered 2020-07-14: 30 g via INTRAVENOUS
  Filled 2020-07-14: qty 300

## 2020-07-14 NOTE — Patient Instructions (Signed)
[place] at [facility] Discharge Instructions  You were seen today by Dr. Delton Coombes. He went over your recent results, and you received your treatment. Dr. Delton Coombes will see you back in 3 months for labs and follow up.   Thank you for choosing [place] at [facility] to provide your oncology and hematology care.  To afford each patient quality time with our provider, please arrive at least 15 minutes before your scheduled appointment time.   If you have a lab appointment with the [facility] please come in thru the Main Entrance and check in at the main information desk  You need to re-schedule your appointment should you arrive 10 or more minutes late.  We strive to give you quality time with our providers, and arriving late affects you and other patients whose appointments are after yours.  Also, if you no show three or more times for appointments you may be dismissed from the clinic at the providers discretion.     Again, thank you for choosing [facility].  Our hope is that these requests will decrease the amount of time that you wait before being seen by our physicians.       _____________________________________________________________  Should you have questions after your visit to [facility], please contact our office at [phone] between the hours of 8:00 a.m. and 4:30 p.m.  Voicemails left after 4:00 p.m. will not be returned until the following business day.  For prescription refill requests, have your pharmacy contact our office and allow 72 hours.    Cancer Center Support Programs:   > Cancer Support Group  2nd Tuesday of the month 1pm-2pm, Journey Room

## 2020-07-14 NOTE — Patient Instructions (Signed)
[facility]  Discharge Instructions: Thank you for choosing [facility] to provide your oncology and hematology care.  If you have a lab appointment with the Cancer Center, please come in thru the Main Entrance and check in at the main information desk.  Wear comfortable clothing and clothing appropriate for easy access to any Portacath or PICC line.   We strive to give you quality time with your provider. You may need to reschedule your appointment if you arrive late (15 or more minutes).  Arriving late affects you and other patients whose appointments are after yours.  Also, if you miss three or more appointments without notifying the office, you may be dismissed from the clinic at the provider's discretion.      For prescription refill requests, have your pharmacy contact our office and allow 72 hours for refills to be completed.        To help prevent nausea and vomiting after your treatment, we encourage you to take your nausea medication as directed.  BELOW ARE SYMPTOMS THAT SHOULD BE REPORTED IMMEDIATELY: *FEVER GREATER THAN 100.4 F (38 C) OR HIGHER *CHILLS OR SWEATING *NAUSEA AND VOMITING THAT IS NOT CONTROLLED WITH YOUR NAUSEA MEDICATION *UNUSUAL SHORTNESS OF BREATH *UNUSUAL BRUISING OR BLEEDING *URINARY PROBLEMS (pain or burning when urinating, or frequent urination) *BOWEL PROBLEMS (unusual diarrhea, constipation, pain near the anus) TENDERNESS IN MOUTH AND THROAT WITH OR WITHOUT PRESENCE OF ULCERS (sore throat, sores in mouth, or a toothache) UNUSUAL RASH, SWELLING OR PAIN  UNUSUAL VAGINAL DISCHARGE OR ITCHING   Items with * indicate a potential emergency and should be followed up as soon as possible or go to the Emergency Department if any problems should occur.  Please show the CHEMOTHERAPY ALERT CARD or IMMUNOTHERAPY ALERT CARD at check-in to the Emergency Department and triage nurse.  Should you have questions after your visit or need to cancel  or reschedule your appointment, please contact [facility] [phone]  and follow the prompts.  Office hours are 8:00 a.m. to 4:30 p.m. Monday - Friday. Please note that voicemails left after 4:00 p.m. may not be returned until the following business day.  We are closed weekends and major holidays. You have access to a nurse at all times for urgent questions. Please call the main number to the clinic [phone] and follow the prompts.  For any non-urgent questions, you may also contact your provider using MyChart. We now offer e-Visits for anyone 18 and older to request care online for non-urgent symptoms. For details visit [email or web address].   Also download the MyChart app! Go to the app store, search "MyChart", open the app, select [facility], and log in with your MyChart username and password.  Due to Covid, a mask is required upon entering the hospital/clinic. If you do not have a mask, one will be given to you upon arrival. For doctor visits, patients may have 1 support person aged 18 or older with them. For treatment visits, patients cannot have anyone with them due to current Covid guidelines and our immunocompromised population.  

## 2020-07-14 NOTE — Progress Notes (Signed)
Patient took her pre-meds at home.   Treatment given per orders. Patient tolerated it well without problems. Vitals stable and discharged home from clinic ambulatory. Follow up as scheduled.

## 2020-07-15 DIAGNOSIS — M9901 Segmental and somatic dysfunction of cervical region: Secondary | ICD-10-CM | POA: Diagnosis not present

## 2020-07-15 DIAGNOSIS — M9902 Segmental and somatic dysfunction of thoracic region: Secondary | ICD-10-CM | POA: Diagnosis not present

## 2020-07-15 DIAGNOSIS — M47812 Spondylosis without myelopathy or radiculopathy, cervical region: Secondary | ICD-10-CM | POA: Diagnosis not present

## 2020-07-15 DIAGNOSIS — M4004 Postural kyphosis, thoracic region: Secondary | ICD-10-CM | POA: Diagnosis not present

## 2020-08-04 IMAGING — MR MR ABDOMEN WO/W CM
7 of 17 series · 17 of 48 positions shown · IV contrast (gadavist)
Comparison: 02/23/2018

CLINICAL DATA: Abnormal CT, history of Hodgkin's.

EXAM:
MRI ABDOMEN WITHOUT AND WITH CONTRAST
TECHNIQUE: Multiplanar multisequence MR imaging of the abdomen was performed
both before and after the administration of intravenous contrast.
CONTRAST:  7mL GADAVIST GADOBUTROL 1 MMOL/ML IV SOLN

[Series 3: T2 · coronal · 4.0mm · 1.14mm/px · 2 of 40 slices shown (1 of 3)]
[im 1/40]
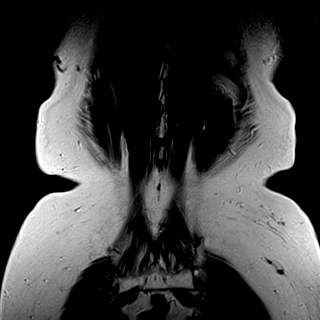
[im 40/40]
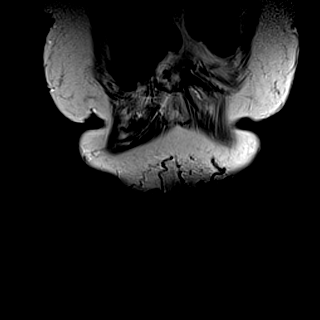

[Series 4: T2 · axial · 4.0mm · 0.44mm/px · z∈[-91,+135]mm · 2 of 48 slices shown (2 of 3)]
[im 1/48]
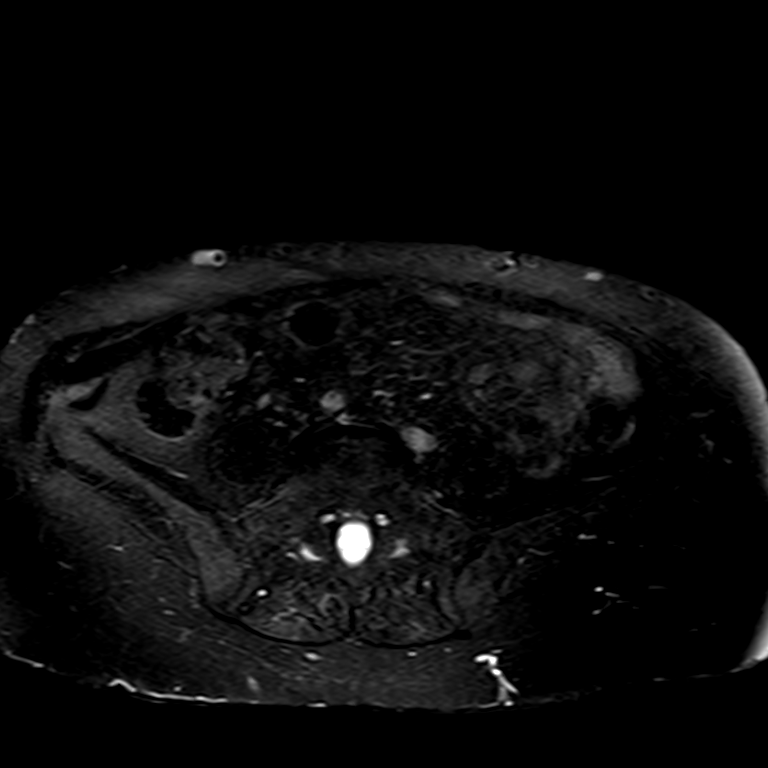
[im 48/48]
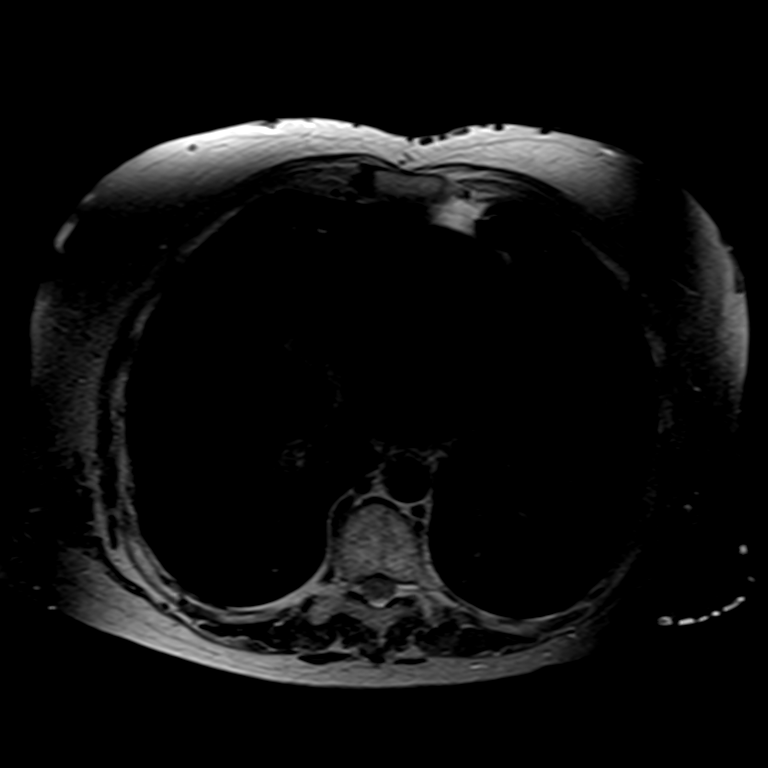

[Series 5: DWI · axial · 5.0mm · 0.93mm/px · z∈[-95,+139]mm · 3 of 80 slices shown]
[im 1/80]
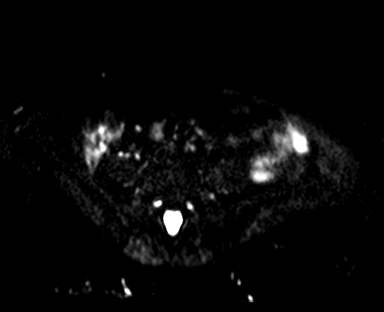
[im 40/80]
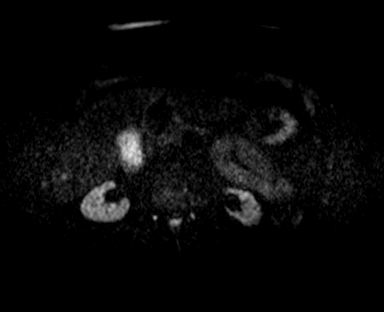
[im 80/80]
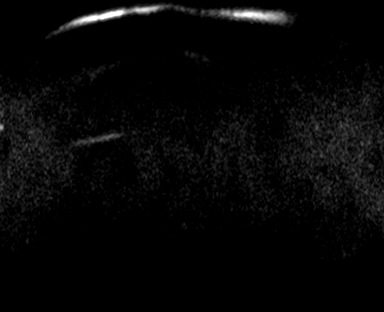

[Series 6: ax dwi_adc · axial · 5.0mm · 0.93mm/px · z∈[-95,+139]mm · 2 of 40 slices shown]
[im 1/40]
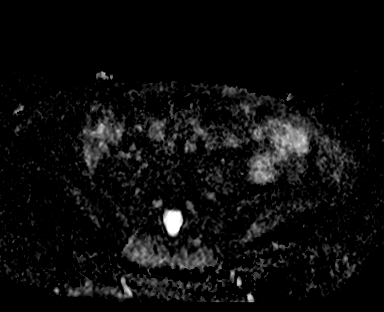
[im 40/40]
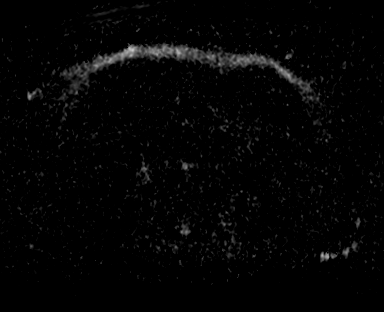

[Series 8: ax dual echo_in · axial · 4.0mm · 0.59mm/px · z∈[-104,+148]mm · 3 of 64 slices shown]
[im 1/64]
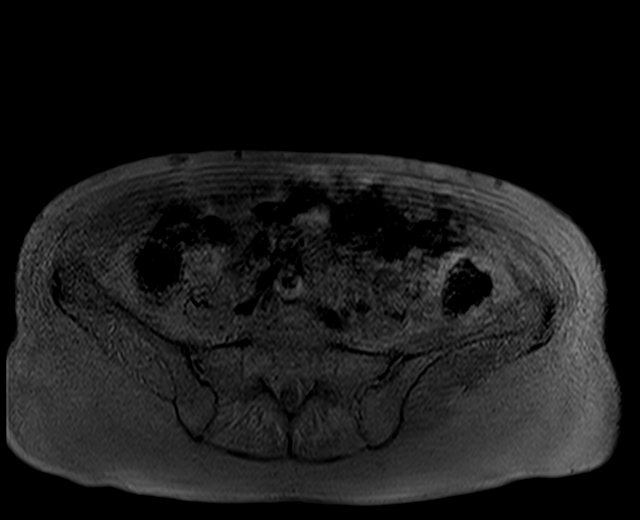
[im 32/64]
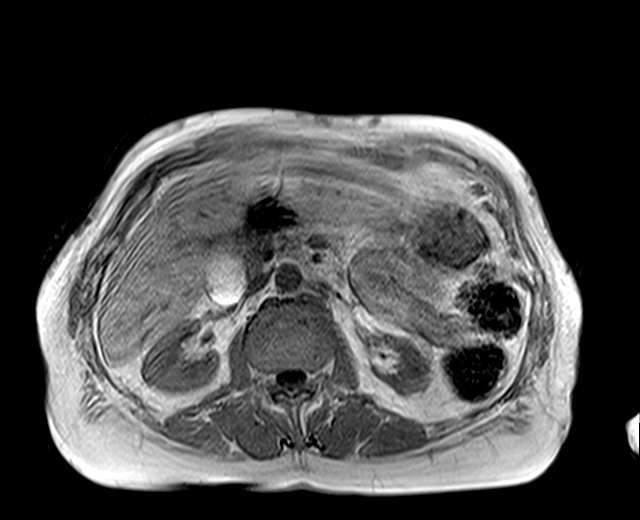
[im 64/64]
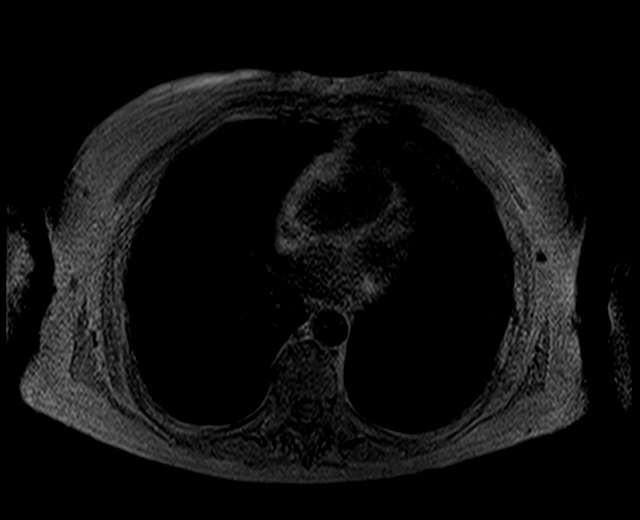

[Series 9: ax dual echo_opp · axial · 4.0mm · 0.59mm/px · z∈[-104,+148]mm · 3 of 64 slices shown]
[im 1/64]
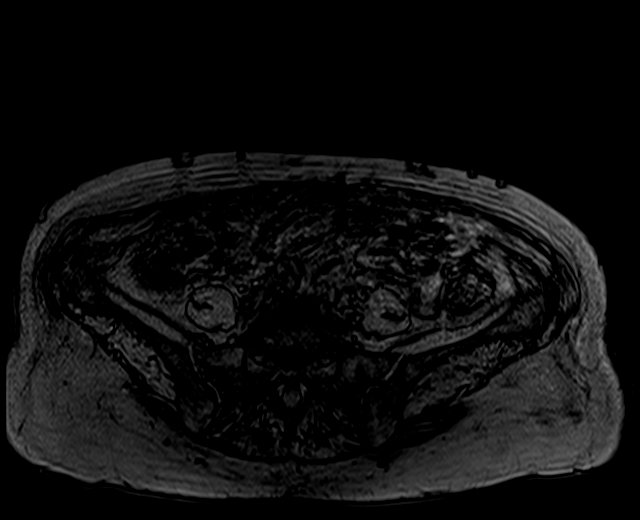
[im 32/64]
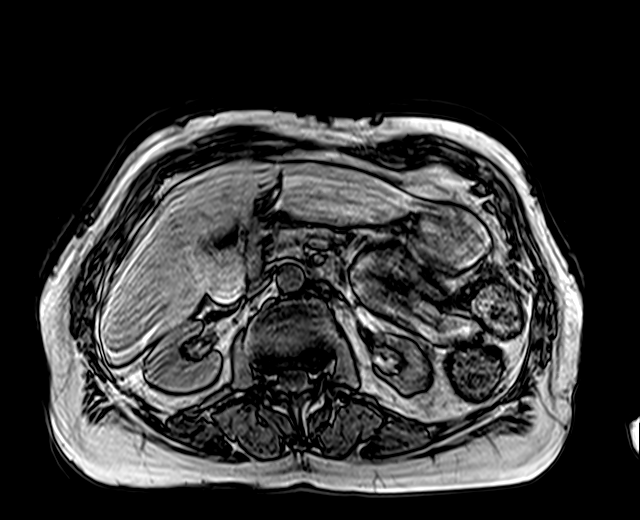
[im 64/64]
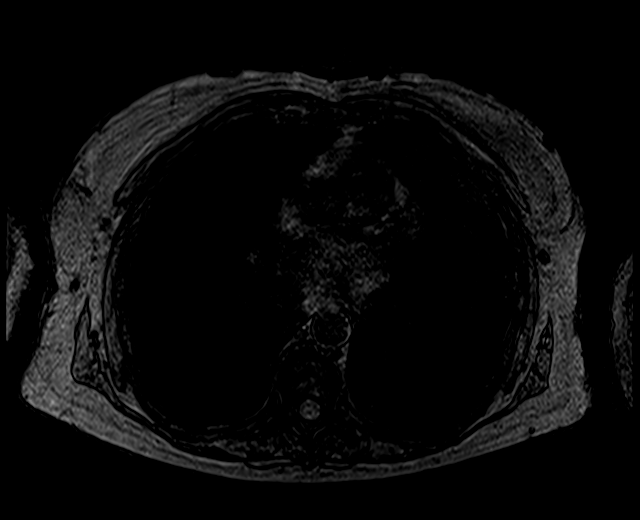

[Series 20: T2 · axial · 5.0mm · 1.32mm/px · z∈[-95,+139]mm · 2 of 40 slices shown (3 of 3)]
[im 1/40]
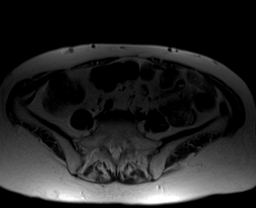
[im 40/40]
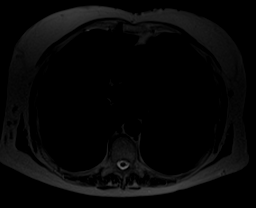

[17 of 48 positions shown; findings below may reference images not displayed]

FINDINGS: Lower chest: Incidental imaging of the lung bases is unremarkable.
Limited on MRI.

Hepatobiliary: Sludge and small stones layering in the dependent
gallbladder. No signs of biliary ductal distension. No focal hepatic
lesion.

Pancreas: No mass, inflammatory changes, or other parenchymal
abnormality identified.

Spleen: No signs of focal splenic lesion. Note that today's exam is
mildly limited due to respiratory motion. No signal abnormalities if
it would on any of the provided sequences however.

Adrenals/Urinary Tract:  Normal adrenal glands.

Marked cortical scarring of the left kidney no hydronephrosis. Small
cyst in the upper pole the left kidney not changed. Small cyst in
the lower pole of the right kidney also unchanged.

Stomach/Bowel: No signs of acute gastrointestinal process to the
extent visualized. Pelvic bowel loops are not imaged.

Vascular/Lymphatic: Extensive body wall collaterals are
redemonstrated. No signs of abdominal adenopathy.

Other:  No signs of ascites.

Musculoskeletal: No destructive bone process or acute bone finding.
IMPRESSION: 1. No evidence of focal splenic lesion. Note that today's exam is
mildly limited due to respiratory motion. This may represent a small
splenic hamartoma that is visibility is highly dependent upon the
phase of enhancement in which is viewed. This could also have
represented a transient phenomenon now resolved. Suggest attention
to this area on follow-up.
2. Sludge and small stones in the dependent gallbladder. No signs of
biliary ductal distension.
3. Stable appearance of bilateral renal cysts and stable left
cortical scarring.
4. Extensive body wall collaterals.

## 2020-08-05 DIAGNOSIS — M9901 Segmental and somatic dysfunction of cervical region: Secondary | ICD-10-CM | POA: Diagnosis not present

## 2020-08-05 DIAGNOSIS — M4004 Postural kyphosis, thoracic region: Secondary | ICD-10-CM | POA: Diagnosis not present

## 2020-08-05 DIAGNOSIS — M47812 Spondylosis without myelopathy or radiculopathy, cervical region: Secondary | ICD-10-CM | POA: Diagnosis not present

## 2020-08-05 DIAGNOSIS — M9902 Segmental and somatic dysfunction of thoracic region: Secondary | ICD-10-CM | POA: Diagnosis not present

## 2020-08-13 DIAGNOSIS — Z6824 Body mass index (BMI) 24.0-24.9, adult: Secondary | ICD-10-CM | POA: Diagnosis not present

## 2020-08-13 DIAGNOSIS — M7711 Lateral epicondylitis, right elbow: Secondary | ICD-10-CM | POA: Diagnosis not present

## 2020-08-18 DIAGNOSIS — M47812 Spondylosis without myelopathy or radiculopathy, cervical region: Secondary | ICD-10-CM | POA: Diagnosis not present

## 2020-08-18 DIAGNOSIS — M9901 Segmental and somatic dysfunction of cervical region: Secondary | ICD-10-CM | POA: Diagnosis not present

## 2020-08-18 DIAGNOSIS — M9902 Segmental and somatic dysfunction of thoracic region: Secondary | ICD-10-CM | POA: Diagnosis not present

## 2020-08-18 DIAGNOSIS — M4004 Postural kyphosis, thoracic region: Secondary | ICD-10-CM | POA: Diagnosis not present

## 2020-09-01 DIAGNOSIS — M47812 Spondylosis without myelopathy or radiculopathy, cervical region: Secondary | ICD-10-CM | POA: Diagnosis not present

## 2020-09-01 DIAGNOSIS — M4004 Postural kyphosis, thoracic region: Secondary | ICD-10-CM | POA: Diagnosis not present

## 2020-09-01 DIAGNOSIS — M9902 Segmental and somatic dysfunction of thoracic region: Secondary | ICD-10-CM | POA: Diagnosis not present

## 2020-09-01 DIAGNOSIS — M9901 Segmental and somatic dysfunction of cervical region: Secondary | ICD-10-CM | POA: Diagnosis not present

## 2020-09-04 ENCOUNTER — Inpatient Hospital Stay (HOSPITAL_COMMUNITY): Payer: Medicare Other

## 2020-09-08 ENCOUNTER — Other Ambulatory Visit: Payer: Self-pay

## 2020-09-08 ENCOUNTER — Ambulatory Visit (HOSPITAL_COMMUNITY): Payer: Medicare Other

## 2020-09-08 ENCOUNTER — Inpatient Hospital Stay (HOSPITAL_COMMUNITY): Payer: Medicare Other | Attending: Hematology

## 2020-09-08 DIAGNOSIS — D801 Nonfamilial hypogammaglobulinemia: Secondary | ICD-10-CM | POA: Diagnosis present

## 2020-09-08 DIAGNOSIS — C8338 Diffuse large B-cell lymphoma, lymph nodes of multiple sites: Secondary | ICD-10-CM

## 2020-09-08 LAB — COMPREHENSIVE METABOLIC PANEL
ALT: 50 U/L — ABNORMAL HIGH (ref 0–44)
AST: 49 U/L — ABNORMAL HIGH (ref 15–41)
Albumin: 3.2 g/dL — ABNORMAL LOW (ref 3.5–5.0)
Alkaline Phosphatase: 111 U/L (ref 38–126)
Anion gap: 8 (ref 5–15)
BUN: 11 mg/dL (ref 8–23)
CO2: 28 mmol/L (ref 22–32)
Calcium: 8.3 mg/dL — ABNORMAL LOW (ref 8.9–10.3)
Chloride: 102 mmol/L (ref 98–111)
Creatinine, Ser: 0.87 mg/dL (ref 0.44–1.00)
GFR, Estimated: >60 mL/min (ref >60)
Glucose, Bld: 100 mg/dL — ABNORMAL HIGH (ref 70–99)
Potassium: 3.2 mmol/L — ABNORMAL LOW (ref 3.5–5.1)
Sodium: 138 mmol/L (ref 135–145)
Total Bilirubin: 0.7 mg/dL (ref 0.3–1.2)
Total Protein: 6.5 g/dL (ref 6.5–8.1)

## 2020-09-08 LAB — CBC WITH DIFFERENTIAL/PLATELET
Abs Immature Granulocytes: 0.03 10*3/uL (ref 0.00–0.07)
Basophils Absolute: 0 10*3/uL (ref 0.0–0.1)
Basophils Relative: 0 %
Eosinophils Absolute: 0.2 10*3/uL (ref 0.0–0.5)
Eosinophils Relative: 2 %
HCT: 38.8 % (ref 36.0–46.0)
Hemoglobin: 12.5 g/dL (ref 12.0–15.0)
Immature Granulocytes: 0 %
Lymphocytes Relative: 13 %
Lymphs Abs: 1.2 10*3/uL (ref 0.7–4.0)
MCH: 31.8 pg (ref 26.0–34.0)
MCHC: 32.2 g/dL (ref 30.0–36.0)
MCV: 98.7 fL (ref 80.0–100.0)
Monocytes Absolute: 0.8 10*3/uL (ref 0.1–1.0)
Monocytes Relative: 9 %
Neutro Abs: 7.4 10*3/uL (ref 1.7–7.7)
Neutrophils Relative %: 76 %
Platelets: 161 10*3/uL (ref 150–400)
RBC: 3.93 MIL/uL (ref 3.87–5.11)
RDW: 12.1 % (ref 11.5–15.5)
WBC: 9.7 10*3/uL (ref 4.0–10.5)
nRBC: 0 % (ref 0.0–0.2)

## 2020-09-09 ENCOUNTER — Inpatient Hospital Stay (HOSPITAL_COMMUNITY): Payer: Medicare Other

## 2020-09-09 VITALS — BP 133/66 | HR 72 | Temp 98.7°F | Resp 18 | Wt 134.8 lb

## 2020-09-09 DIAGNOSIS — D801 Nonfamilial hypogammaglobulinemia: Secondary | ICD-10-CM | POA: Diagnosis not present

## 2020-09-09 MED ORDER — DEXTROSE 5 % IV SOLN: INTRAVENOUS | Status: DC

## 2020-09-09 MED ORDER — IMMUNE GLOBULIN (HUMAN) 10 GM/100ML IV SOLN
30.0000 g | Freq: Once | INTRAVENOUS | Status: AC
  Administered 2020-09-09: 30 g via INTRAVENOUS
  Filled 2020-09-09: qty 200

## 2020-09-09 MED ORDER — IMMUNE GLOBULIN (HUMAN) 10 GM/100ML IV SOLN
10.0000 g | Freq: Once | INTRAVENOUS | Status: AC
  Administered 2020-09-09: 10 g via INTRAVENOUS
  Filled 2020-09-09: qty 100

## 2020-09-09 NOTE — Patient Instructions (Signed)
[facility]  Discharge Instructions: ?Thank you for choosing [facility] to provide your oncology and hematology care.  ?If you have a lab appointment with the Cancer Center, please come in thru the Main Entrance and check in at the main information desk. ? ? ? ?We strive to give you quality time with your provider. You may need to reschedule your appointment if you arrive late (15 or more minutes).  Arriving late affects you and other patients whose appointments are after yours.  Also, if you miss three or more appointments without notifying the office, you may be dismissed from the clinic at the provider?s discretion.    ?  ?For prescription refill requests, have your pharmacy contact our office and allow 72 hours for refills to be completed.   ? ?  ?To help prevent nausea and vomiting after your treatment, we encourage you to take your nausea medication as directed. ? ?BELOW ARE SYMPTOMS THAT SHOULD BE REPORTED IMMEDIATELY: ?*FEVER GREATER THAN 100.4 F (38 ?C) OR HIGHER ?*CHILLS OR SWEATING ?*NAUSEA AND VOMITING THAT IS NOT CONTROLLED WITH YOUR NAUSEA MEDICATION ?*UNUSUAL SHORTNESS OF BREATH ?*UNUSUAL BRUISING OR BLEEDING ?*URINARY PROBLEMS (pain or burning when urinating, or frequent urination) ?*BOWEL PROBLEMS (unusual diarrhea, constipation, pain near the anus) ?TENDERNESS IN MOUTH AND THROAT WITH OR WITHOUT PRESENCE OF ULCERS (sore throat, sores in mouth, or a toothache) ?UNUSUAL RASH, SWELLING OR PAIN  ?UNUSUAL VAGINAL DISCHARGE OR ITCHING  ? ?Items with * indicate a potential emergency and should be followed up as soon as possible or go to the Emergency Department if any problems should occur. ? ?Please show the CHEMOTHERAPY ALERT CARD or IMMUNOTHERAPY ALERT CARD at check-in to the Emergency Department and triage nurse. ? ?Should you have questions after your visit or need to cancel or reschedule your appointment, please contact [facility] [phone]  and follow  the prompts.  Office hours are 8:00 a.m. to 4:30 p.m. Monday - Friday. Please note that voicemails left after 4:00 p.m. may not be returned until the following business day.  We are closed weekends and major holidays. You have access to a nurse at all times for urgent questions. Please call the main number to the clinic [phone] and follow the prompts. ? ?For any non-urgent questions, you may also contact your provider using MyChart. We now offer e-Visits for anyone 18 and older to request care online for non-urgent symptoms. For details visit [email or web address]. ?  ?Also download the MyChart app! Go to the app store, search "MyChart", open the app, select , and log in with your MyChart username and password. ? ?Due to Covid, a mask is required upon entering the hospital/clinic. If you do not have a mask, one will be given to you upon arrival. For doctor visits, patients may have 1 support person aged 18 or older with them. For treatment visits, patients cannot have anyone with them due to current Covid guidelines and our immunocompromised population.  ?

## 2020-09-09 NOTE — Progress Notes (Signed)
Patient presents today for IVIG infusion.  Patient has no new complaints and is in stable condition.  Tylenol 650 mg and Benadryl 25 mg taken at home before visit.  Vital signs are stable.  Proceed with treatment as ordered.   Patient tolerated treatment well with no complaints voiced.  Patient left ambulatory in stable condition.  Vital signs stable at discharge.  Follow up as scheduled.

## 2020-09-15 DIAGNOSIS — S80812A Abrasion, left lower leg, initial encounter: Secondary | ICD-10-CM | POA: Diagnosis not present

## 2020-09-15 DIAGNOSIS — Z6823 Body mass index (BMI) 23.0-23.9, adult: Secondary | ICD-10-CM | POA: Diagnosis not present

## 2020-10-08 ENCOUNTER — Ambulatory Visit (HOSPITAL_COMMUNITY)
Admission: RE | Admit: 2020-10-08 | Discharge: 2020-10-08 | Disposition: A | Discharge status: Home / Self Care | Payer: Medicare Other | Source: Ambulatory Visit | Attending: Family Medicine | Admitting: Family Medicine

## 2020-10-08 ENCOUNTER — Other Ambulatory Visit: Payer: Self-pay | Admitting: Family Medicine

## 2020-10-08 ENCOUNTER — Other Ambulatory Visit: Payer: Self-pay

## 2020-10-08 ENCOUNTER — Other Ambulatory Visit (HOSPITAL_COMMUNITY): Payer: Self-pay | Admitting: Family Medicine

## 2020-10-08 DIAGNOSIS — M7989 Other specified soft tissue disorders: Secondary | ICD-10-CM | POA: Diagnosis not present

## 2020-10-08 DIAGNOSIS — Z23 Encounter for immunization: Secondary | ICD-10-CM | POA: Diagnosis not present

## 2020-10-08 DIAGNOSIS — M1990 Unspecified osteoarthritis, unspecified site: Secondary | ICD-10-CM | POA: Diagnosis not present

## 2020-10-08 DIAGNOSIS — J449 Chronic obstructive pulmonary disease, unspecified: Secondary | ICD-10-CM | POA: Diagnosis not present

## 2020-10-26 DIAGNOSIS — M1990 Unspecified osteoarthritis, unspecified site: Secondary | ICD-10-CM | POA: Diagnosis not present

## 2020-10-26 DIAGNOSIS — Z6824 Body mass index (BMI) 24.0-24.9, adult: Secondary | ICD-10-CM | POA: Diagnosis not present

## 2020-10-26 DIAGNOSIS — M25561 Pain in right knee: Secondary | ICD-10-CM | POA: Diagnosis not present

## 2020-11-02 ENCOUNTER — Inpatient Hospital Stay (HOSPITAL_COMMUNITY): Payer: Medicare Other | Attending: Hematology

## 2020-11-02 DIAGNOSIS — D801 Nonfamilial hypogammaglobulinemia: Secondary | ICD-10-CM | POA: Insufficient documentation

## 2020-11-02 DIAGNOSIS — D649 Anemia, unspecified: Secondary | ICD-10-CM | POA: Insufficient documentation

## 2020-11-02 DIAGNOSIS — C8338 Diffuse large B-cell lymphoma, lymph nodes of multiple sites: Secondary | ICD-10-CM | POA: Insufficient documentation

## 2020-11-02 LAB — COMPREHENSIVE METABOLIC PANEL
ALT: 13 U/L (ref 0–44)
AST: 23 U/L (ref 15–41)
Albumin: 3.3 g/dL — ABNORMAL LOW (ref 3.5–5.0)
Alkaline Phosphatase: 117 U/L (ref 38–126)
Anion gap: 7 (ref 5–15)
BUN: 19 mg/dL (ref 8–23)
CO2: 28 mmol/L (ref 22–32)
Calcium: 8.5 mg/dL — ABNORMAL LOW (ref 8.9–10.3)
Chloride: 105 mmol/L (ref 98–111)
Creatinine, Ser: 0.91 mg/dL (ref 0.44–1.00)
GFR, Estimated: >60 mL/min (ref >60)
Glucose, Bld: 110 mg/dL — ABNORMAL HIGH (ref 70–99)
Potassium: 4 mmol/L (ref 3.5–5.1)
Sodium: 140 mmol/L (ref 135–145)
Total Bilirubin: 0.6 mg/dL (ref 0.3–1.2)
Total Protein: 6.6 g/dL (ref 6.5–8.1)

## 2020-11-02 LAB — CBC WITH DIFFERENTIAL/PLATELET
Abs Immature Granulocytes: 0.01 10*3/uL (ref 0.00–0.07)
Basophils Absolute: 0.1 10*3/uL (ref 0.0–0.1)
Basophils Relative: 1 %
Eosinophils Absolute: 0.3 10*3/uL (ref 0.0–0.5)
Eosinophils Relative: 6 %
HCT: 34.4 % — ABNORMAL LOW (ref 36.0–46.0)
Hemoglobin: 10.8 g/dL — ABNORMAL LOW (ref 12.0–15.0)
Immature Granulocytes: 0 %
Lymphocytes Relative: 28 %
Lymphs Abs: 1.5 10*3/uL (ref 0.7–4.0)
MCH: 31.2 pg (ref 26.0–34.0)
MCHC: 31.4 g/dL (ref 30.0–36.0)
MCV: 99.4 fL (ref 80.0–100.0)
Monocytes Absolute: 0.6 10*3/uL (ref 0.1–1.0)
Monocytes Relative: 11 %
Neutro Abs: 3 10*3/uL (ref 1.7–7.7)
Neutrophils Relative %: 54 %
Platelets: 166 10*3/uL (ref 150–400)
RBC: 3.46 MIL/uL — ABNORMAL LOW (ref 3.87–5.11)
RDW: 12.8 % (ref 11.5–15.5)
WBC: 5.4 10*3/uL (ref 4.0–10.5)
nRBC: 0 % (ref 0.0–0.2)

## 2020-11-02 LAB — IRON AND TIBC
Iron: 46 ug/dL (ref 28–170)
Saturation Ratios: 22 % (ref 10.4–31.8)
TIBC: 212 ug/dL — ABNORMAL LOW (ref 250–450)
UIBC: 166 ug/dL

## 2020-11-02 LAB — FERRITIN: Ferritin: 120 ng/mL (ref 11–307)

## 2020-11-02 LAB — LACTATE DEHYDROGENASE: LDH: 186 U/L (ref 98–192)

## 2020-11-02 NOTE — Progress Notes (Signed)
[place], [place] [address]   CLINIC:  Medical Oncology/Hematology  PCP:  Sharilyn Sites, MD [address] / [place] [address] [phone]   REASON FOR VISIT:  Follow-up for acquired hypogammaglobulinemia and DLBCL  PRIOR THERAPY: R-CHOP x 4 cycles with intrathecal methotrexate during cycles 2 & 4  CURRENT THERAPY: IVIG every 8 weeks; intermittent Feraheme last on 12/13/2019  BRIEF ONCOLOGIC HISTORY:  Oncology History  Non Hodgkin's lymphoma ([place])  09/09/2009 Initial Diagnosis   Non Hodgkin's lymphoma ([place])     CANCER STAGING: Cancer Staging Non Hodgkin's lymphoma ([place]) Staging form: Lymphoid Neoplasms, AJCC 6th Edition - Clinical: Stage IV - Signed by Baird Cancer, PA on 11/05/2010   INTERVAL HISTORY:  [name], a 64 y.o. female, returns for routine follow-up of her acquired hypogammaglobulinemia and DLBCL. [name] was last seen on 03/24/2020.   Today she reports feeling good. She denies any recent infections or antibiotic use. She denies fevers, night sweats, weight loss, current bleeding, and black stools. She reports occasional chills in the afternoon, and she reports swelling in her right leg and foot. She reports history of DVT in her left leg. She denies pain in her calves, but reports pain in her knee and foot.  Her appetite is fair, and she denies nausea and vomiting.   REVIEW OF SYSTEMS:  Review of Systems  Constitutional:  Positive for chills. Negative for appetite change, fatigue (75%), fever and unexpected weight change.  HENT:   Negative for nosebleeds.   Respiratory:  Negative for hemoptysis.   Cardiovascular:  Positive for leg swelling (R) and palpitations.  Gastrointestinal:  Negative for blood in stool, nausea and vomiting.  Genitourinary:  Negative for hematuria and vaginal bleeding.   Musculoskeletal:  Positive for arthralgias (5/10 R knee and foot).  Neurological:  Positive for numbness  (hands).  Hematological:  Does not bruise/bleed easily.  All other systems reviewed and are negative.  PAST MEDICAL/SURGICAL HISTORY:  Past Medical History:  Diagnosis Date   Allergic rhinitis    Anxiety    Asthma    B12 deficiency 01/18/2015   Cataract    Cavitary lung disease    Chronic sinusitis    COPD (chronic obstructive pulmonary disease) (HCC)    Depression    Diffuse large B-cell lymphoma (HCC)    DVT (deep venous thrombosis) ([facility]) 09/25/2014   Essential hypertension    GERD (gastroesophageal reflux disease)    Hematuria 04/07/2016   History of pneumonia 01/2013   Hyperlipidemia    Hypogammaglobulinemia, acquired ([place])    Iron deficiency anemia 01/18/2015   Myocardial infarction ([facility]) 2011   Narcolepsy without cataplexy(347.00) 01/18/2015   Orofacial dyskinesia 04/22/2015   Osteoporosis    Peripheral neuropathy due to chemotherapy ([facility]) 05/02/2016   Sleep apnea    SVC syndrome 09/25/2014   Past Surgical History:  Procedure Laterality Date   ABDOMINAL HYSTERECTOMY     Fibroids   BASAL CELL CARCINOMA EXCISION  03/2011   scalp   BREAST SURGERY Right    biopsy   CATARACT EXTRACTION W/PHACO Left 11/17/2014   Procedure: CATARACT EXTRACTION PHACO AND INTRAOCULAR LENS PLACEMENT LEFT EYE;  Surgeon: Tonny Branch, MD;  Location: [facility];  Service: Ophthalmology;  Laterality: Left;  CDE:5.60   CATARACT EXTRACTION W/PHACO Right 12/15/2014   Procedure: CATARACT EXTRACTION PHACO AND INTRAOCULAR LENS PLACEMENT RIGHT EYE CDE=5.16;  Surgeon: Tonny Branch, MD;  Location: [facility];  Service: Ophthalmology;  Laterality: Right;  COLONOSCOPY N/A 11/14/2012   Procedure: COLONOSCOPY;  Surgeon: Rogene Houston, MD;  Location: [facility];  Service: Endoscopy;  Laterality: N/A;  830   COLONOSCOPY WITH PROPOFOL N/A 01/12/2018   Procedure: COLONOSCOPY WITH PROPOFOL;  Surgeon: Rogene Houston, MD;  Location: [facility];  Service: Endoscopy;  Laterality: N/A;  7:30   EYE SURGERY     NASAL SINUS  SURGERY     NECK SURGERY     x 2    PERIPHERALLY INSERTED CENTRAL CATHETER INSERTION     PICC Removal     POLYPECTOMY  01/12/2018   Procedure: POLYPECTOMY;  Surgeon: Rogene Houston, MD;  Location: [facility];  Service: Endoscopy;;  colon   PORT-A-CATH REMOVAL     PORTACATH PLACEMENT  7/11   Removed 6/12   TRACHEOSTOMY     VESICOVAGINAL FISTULA CLOSURE W/ TAH      SOCIAL HISTORY:  Social History   Socioeconomic History   Marital status: Divorced    Spouse name: Not on file   Number of children: 3   Years of education: 12   Highest education level: Not on file  Occupational History   Occupation: teller    Comment: [employer]  Tobacco Use   Smoking status: Never   Smokeless tobacco: Never  Vaping Use   Vaping Use: Never used  Substance and Sexual Activity   Alcohol use: No   Drug use: No   Sexual activity: Not on file    Comment: hyst  Other Topics Concern   Not on file  Social History Narrative   Originally from [place]. Always lived in [place]. Prior travel to [place]. 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 She has 1 dog currently. She has 1 conures (small parrots). No mold exposure in her home. 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 No hot tub exposure. She enjoys sewing & quilting.    Social Determinants of Health   Financial Resource Strain: Medium Risk   Difficulty of Paying Living Expenses: Somewhat hard  Food Insecurity: No Food Insecurity   Worried About Charity fundraiser in the Last Year: Never true   Ran Out of Food in the Last Year: Never true  Transportation Needs: No Transportation Needs   Lack of Transportation (Medical): No   Lack of Transportation (Non-Medical): No  Physical Activity: Insufficiently Active   Days of Exercise per Week: 2 days   Minutes of Exercise per Session: 10 min  Stress: No Stress Concern Present   Feeling of Stress : Not at all  Social Connections:  Moderately Integrated   Frequency of Communication with Friends and Family: More than three times a week   Frequency of Social Gatherings with Friends and Family: More than three times a week   Attends Religious Services: More than 4 times per year   Active Member of [employer] or Organizations: Yes   Attends Music therapist: More than 4 times per year   Marital Status: Divorced  Human resources officer Violence: Not At Risk   Fear of Current or Ex-Partner: No   Emotionally Abused: No   Physically Abused: No   Sexually Abused: No    FAMILY HISTORY:  Family History  Problem Relation Age of Onset   Emphysema Mother    Stroke Mother    COPD Mother    Heart disease Mother  died in sleep  77   Allergies Father    Asthma Father        as a child   Arthritis Father    Parkinson's disease Father 46   Leukemia Maternal Grandmother    Cancer Maternal Grandmother        Leukemia   Diabetes Brother    Heart attack Brother    Hypertension Brother    Heart disease Brother 19       stents   Hypertension Sister    Hypertension Brother     CURRENT MEDICATIONS:  Current Outpatient Medications  Medication Sig Dispense Refill   acetaminophen (TYLENOL) 500 MG tablet Take 1,000 mg by mouth every 6 (six) hours as needed for moderate pain or headache.      acyclovir (ZOVIRAX) 400 MG tablet TAKE ONE TABLET BY MOUTH TWICE DAILY. 60 tablet 0   albuterol (PROAIR HFA) 108 (90 Base) MCG/ACT inhaler Inhale 2 puffs into the lungs every 6 (six) hours as needed. (Patient taking differently: Inhale 2 puffs into the lungs every 6 (six) hours as needed for wheezing or shortness of breath.) 8.5 g 2   albuterol (PROVENTIL) (2.5 MG/3ML) 0.083% nebulizer solution Take 2.5 mg by nebulization every 4 (four) hours as needed for wheezing or shortness of breath.     amphetamine-dextroamphetamine (ADDERALL) 10 MG tablet Take 10 mg by mouth every morning.   0   Armodafinil 150 MG tablet Take 150 mg by mouth  every morning.      budesonide-formoterol (SYMBICORT) 160-4.5 MCG/ACT inhaler Inhale 2 puffs into the lungs 2 (two) times daily. 1 Inhaler 5   celecoxib (CELEBREX) 200 MG capsule Take 200 mg by mouth 2 (two) times daily as needed.     Cholecalciferol (VITAMIN D3 PO) Take 1 capsule by mouth at bedtime.     citalopram (CELEXA) 20 MG tablet Take 20 mg by mouth every morning.     cyanocobalamin 1000 MCG tablet Take 1,000 mcg by mouth daily.     furosemide (LASIX) 20 MG tablet Take 1 tablet (20 mg total) by mouth as needed for fluid. 90 tablet 1   gabapentin (NEURONTIN) 600 MG tablet 300 mg 2 (two) times daily.      olmesartan (BENICAR) 40 MG tablet Take 20-40 mg by mouth daily.     omeprazole (PRILOSEC) 20 MG capsule TAKE ONE CAPSULE BY MOUTH TWICE DAILY BEFORE A MEAL. 180 capsule 0   predniSONE (DELTASONE) 5 MG tablet TAKE 1 TABLET BY MOUTH ONCE A DAY. 30 tablet 0   simvastatin (ZOCOR) 80 MG tablet Take 1 tablet (80 mg total) by mouth at bedtime. 90 tablet 3   zafirlukast (ACCOLATE) 20 MG tablet TAKE 1 TABLET BY MOUTH TWICE DAILY. 60 tablet 0   No current facility-administered medications for this visit.   Facility-Administered Medications Ordered in Other Visits  Medication Dose Route Frequency Provider Last Rate Last Admin   0.9 %  sodium chloride infusion   Intravenous Once Kefalas, Thomas S, PA-C       0.9 %  sodium chloride infusion   Intravenous Once Kefalas, Thomas S, PA-C       acetaminophen (TYLENOL) tablet 650 mg  650 mg Oral Q6H PRN Kefalas, Thomas S, PA-C       acetaminophen (TYLENOL) tablet 650 mg  650 mg Oral Q6H PRN Kefalas, Manon Hilding, PA-C       dextrose 5 % solution   Intravenous Continuous Baird Cancer, PA-C   Stopped at 08/25/15 1400  dextrose 5 % solution   Intravenous Continuous Baird Cancer, PA-C   Stopped at 07/13/16 1350   dextrose 5 % solution   Intravenous Continuous Holley Bouche, NP   Stopped at 02/28/17 1230   dextrose 5 % solution   Intravenous  Continuous Derek Jack, MD 10 mL/hr at 11/03/20 0823 New Bag at 11/03/20 0823   diphenhydrAMINE (BENADRYL) capsule 25 mg  25 mg Oral Once Kefalas, Thomas S, PA-C       diphenhydrAMINE (BENADRYL) capsule 25 mg  25 mg Oral Once Kefalas, Thomas S, PA-C       sodium chloride 0.9 % injection 10 mL  10 mL Intracatheter PRN Baird Cancer, PA-C   10 mL at 08/25/15 1125    ALLERGIES:  Allergies  Allergen Reactions   Meperidine Hcl Anaphylaxis   Montelukast Sodium Hives and Rash    PHYSICAL EXAM:  Performance status (ECOG): 1 - Symptomatic but completely ambulatory  Vitals:   11/03/20 0818  BP: (!) 144/68  Pulse: 92  Resp: 18  Temp: (!) 96.6 F (35.9 C)  SpO2: 95%   Wt Readings from Last 3 Encounters:  11/03/20 138 lb 9.6 oz (62.9 kg)  11/03/20 138 lb 6 oz (62.8 kg)  09/09/20 134 lb 12.8 oz (61.1 kg)   Physical Exam Vitals reviewed.  Constitutional:      Appearance: Normal appearance.  Cardiovascular:     Rate and Rhythm: Normal rate and regular rhythm.     Pulses: Normal pulses.     Heart sounds: Normal heart sounds.  Pulmonary:     Effort: Pulmonary effort is normal.     Breath sounds: Normal breath sounds.  Abdominal:     Palpations: Abdomen is soft. There is no hepatomegaly, splenomegaly or mass.     Tenderness: There is no abdominal tenderness.  Musculoskeletal:     Right lower leg: Tenderness present. 2+ Edema present.     Left lower leg: Edema (trace) present.  Neurological:     General: No focal deficit present.     Mental Status: She is alert and oriented to person, place, and time.  Psychiatric:        Mood and Affect: Mood normal.        Behavior: Behavior normal.     LABORATORY DATA:  I have reviewed the labs as listed.  CBC Latest Ref Rng & Units 11/02/2020 09/08/2020 07/13/2020  WBC 4.0 - 10.5 K/uL 5.4 9.7 6.3  Hemoglobin 12.0 - 15.0 g/dL 10.8(L) 12.5 12.9  Hematocrit 36.0 - 46.0 % 34.4(L) 38.8 40.0  Platelets 150 - 400 K/uL 166 161 158    CMP Latest Ref Rng & Units 11/02/2020 09/08/2020 07/13/2020  Glucose 70 - 99 mg/dL 110(H) 100(H) 94  BUN 8 - 23 mg/dL 19 11 12   Creatinine 0.44 - 1.00 mg/dL 0.91 0.87 0.91  Sodium 135 - 145 mmol/L 140 138 139  Potassium 3.5 - 5.1 mmol/L 4.0 3.2(L) 3.7  Chloride 98 - 111 mmol/L 105 102 102  CO2 22 - 32 mmol/L 28 28 28   Calcium 8.9 - 10.3 mg/dL 8.5(L) 8.3(L) 8.2(L)  Total Protein 6.5 - 8.1 g/dL 6.6 6.5 6.7  Total Bilirubin 0.3 - 1.2 mg/dL 0.6 0.7 0.3  Alkaline Phos 38 - 126 U/L 117 111 114  AST 15 - 41 U/L 23 49(H) 16  ALT 0 - 44 U/L 13 50(H) 10    DIAGNOSTIC IMAGING:  I have independently reviewed the scans and discussed with the patient. US Venous  Img Lower Unilateral Right (DVT)  Result Date: 10/08/2020 CLINICAL DATA:  Right lower extremity swelling for the past 3 weeks EXAM: RIGHT LOWER EXTREMITY VENOUS DOPPLER ULTRASOUND TECHNIQUE: Gray-scale sonography with compression, as well as color and duplex ultrasound, were performed to evaluate the deep venous system(s) from the level of the common femoral vein through the popliteal and proximal calf veins. COMPARISON:  None. FINDINGS: VENOUS Normal compressibility of the common femoral, superficial femoral, and popliteal veins, as well as the visualized calf veins. Visualized portions of profunda femoral vein and great saphenous vein unremarkable. No filling defects to suggest DVT on grayscale or color Doppler imaging. Doppler waveforms show normal direction of venous flow, normal respiratory plasticity and response to augmentation. Limited views of the contralateral common femoral vein are unremarkable. OTHER None. Limitations: none IMPRESSION: Negative. Electronically Signed   By: Jacqulynn Cadet M.D.   On: 10/08/2020 13:58     ASSESSMENT:  1. Acquired hypogammaglobulinemia: -She has been receiving IVIG every 8 weeks since January 2020. -She does have severely low IgA levels.  However he does not have any reactions.   2.  DLBCL, stage  IVb: -She was diagnosed 07/08/2009.  Treated with 4 cycles of R-CHOP with intrathecal methotrexate during cycle 2 and 4.  She developed a Pseudomonas sepsis after cycles 2 and 4 and therapy was stopped after 4 cycles.  She also received 2 years of maintenance rituximab. -Last PET scan on 03/20/2017 did not show any evidence of lymphoma.   3.  Health maintenance: -Mammogram on 11/08/2018, BI-RADS Category 1.   PLAN:  1. Acquired hypogammaglobulinemia: - Since last visit, she has not had any infections. - She had right leg swelling for which Doppler was done reviewed by me from 10/08/2020 was negative for DVT. - We have reviewed her immunoglobulin trough levels which were 1186. - We will continue IVIG at the same dose levels.  We will continue at 8-week interval. - RTC 16 weeks with repeat labs.   2.  DLBCL, stage IVb: - No palpable adenopathy or splenomegaly on examination. - LDH was within normal limits.  No B symptoms. - Continue close monitoring.   3.  Normocytic anemia: - Hemoglobin dropped down to 10.8 from 12.5 previously.  Ferritin was 120 and percent saturation 22. - She is feeling tired with decreased energy levels. - Recommend Venofer x2.   Orders placed this encounter:  No orders of the defined types were placed in this encounter.    Derek Jack, MD [place] [phone]   I, Thana Ates, am acting as a scribe for Dr. Derek Jack.  I, Derek Jack MD, have reviewed the above documentation for accuracy and completeness, and I agree with the above.

## 2020-11-03 ENCOUNTER — Inpatient Hospital Stay (HOSPITAL_COMMUNITY): Payer: Medicare Other | Attending: Hematology

## 2020-11-03 ENCOUNTER — Other Ambulatory Visit (HOSPITAL_COMMUNITY): Payer: Self-pay

## 2020-11-03 ENCOUNTER — Inpatient Hospital Stay (HOSPITAL_BASED_OUTPATIENT_CLINIC_OR_DEPARTMENT_OTHER): Payer: Medicare Other | Admitting: Hematology

## 2020-11-03 ENCOUNTER — Other Ambulatory Visit: Payer: Self-pay

## 2020-11-03 VITALS — BP 144/68 | HR 92 | Temp 96.6°F | Resp 18 | Wt 138.6 lb

## 2020-11-03 VITALS — BP 166/68 | HR 60 | Temp 97.1°F | Resp 18 | Wt 138.4 lb

## 2020-11-03 DIAGNOSIS — C8338 Diffuse large B-cell lymphoma, lymph nodes of multiple sites: Secondary | ICD-10-CM

## 2020-11-03 DIAGNOSIS — D801 Nonfamilial hypogammaglobulinemia: Secondary | ICD-10-CM | POA: Insufficient documentation

## 2020-11-03 DIAGNOSIS — D649 Anemia, unspecified: Secondary | ICD-10-CM | POA: Diagnosis not present

## 2020-11-03 DIAGNOSIS — Z8572 Personal history of non-Hodgkin lymphomas: Secondary | ICD-10-CM | POA: Diagnosis not present

## 2020-11-03 LAB — IGG, IGA, IGM
IgA: <5 mg/dL — ABNORMAL LOW (ref 87–352)
IgG (Immunoglobin G), Serum: 1322 mg/dL (ref 586–1602)
IgM (Immunoglobulin M), Srm: 131 mg/dL (ref 26–217)

## 2020-11-03 MED ORDER — IMMUNE GLOBULIN (HUMAN) 10 GM/100ML IV SOLN
30.0000 g | Freq: Once | INTRAVENOUS | Status: AC
  Administered 2020-11-03: 30 g via INTRAVENOUS
  Filled 2020-11-03: qty 200

## 2020-11-03 MED ORDER — DEXTROSE 5 % IV SOLN: INTRAVENOUS | Status: DC

## 2020-11-03 MED ORDER — ACYCLOVIR 400 MG PO TABS: 400.0000 mg | ORAL_TABLET | Freq: Two times a day (BID) | ORAL | 6 refills | Status: DC

## 2020-11-03 NOTE — Progress Notes (Signed)
Chaplain engaged in an initial visit with [name].  [name] shared about her treatment, healthcare journey, and family.  [name] shared her testimony of being on life support years ago and how doctors voiced that she would not live.  [name] has been able to overcome a number of obstacles.  Chaplain and [name] spent time talking about her three daughters and also how she spends her time now.  Chaplain expressed gratefulness in hearing [name]'s story.  Chaplain will continue to follow-up.    11/03/20 1100  Clinical Encounter Type  Visited With Patient  Visit Type Initial

## 2020-11-03 NOTE — Patient Instructions (Signed)
[facility]  Discharge Instructions: Thank you for choosing [facility] to provide your oncology and hematology care.  If you have a lab appointment with the [facility], please come in thru the Main Entrance and check in at the main information desk.  Wear comfortable clothing and clothing appropriate for easy access to any Portacath or PICC line.   We strive to give you quality time with your provider. You may need to reschedule your appointment if you arrive late (15 or more minutes).  Arriving late affects you and other patients whose appointments are after yours.  Also, if you miss three or more appointments without notifying the office, you may be dismissed from the clinic at the provider's discretion.      For prescription refill requests, have your pharmacy contact our office and allow 72 hours for refills to be completed.    Today you received the following IVIG, return as scheduled.   To help prevent nausea and vomiting after your treatment, we encourage you to take your nausea medication as directed.  BELOW ARE SYMPTOMS THAT SHOULD BE REPORTED IMMEDIATELY: *FEVER GREATER THAN 100.4 F (38 C) OR HIGHER *CHILLS OR SWEATING *NAUSEA AND VOMITING THAT IS NOT CONTROLLED WITH YOUR NAUSEA MEDICATION *UNUSUAL SHORTNESS OF BREATH *UNUSUAL BRUISING OR BLEEDING *URINARY PROBLEMS (pain or burning when urinating, or frequent urination) *BOWEL PROBLEMS (unusual diarrhea, constipation, pain near the anus) TENDERNESS IN MOUTH AND THROAT WITH OR WITHOUT PRESENCE OF ULCERS (sore throat, sores in mouth, or a toothache) UNUSUAL RASH, SWELLING OR PAIN  UNUSUAL VAGINAL DISCHARGE OR ITCHING   Items with * indicate a potential emergency and should be followed up as soon as possible or go to the Emergency Department if any problems should occur.  Please show the CHEMOTHERAPY ALERT CARD or IMMUNOTHERAPY ALERT CARD at check-in to the Emergency Department and triage  nurse.  Should you have questions after your visit or need to cancel or reschedule your appointment, please contact [facility] [phone]  and follow the prompts.  Office hours are 8:00 a.m. to 4:30 p.m. Monday - Friday. Please note that voicemails left after 4:00 p.m. may not be returned until the following business day.  We are closed weekends and major holidays. You have access to a nurse at all times for urgent questions. Please call the main number to the clinic [phone] and follow the prompts.  For any non-urgent questions, you may also contact your provider using MyChart. We now offer e-Visits for anyone 78 and older to request care online for non-urgent symptoms. For details visit [email or web address].   Also download the MyChart app! Go to the app store, search "MyChart", open the app, select [facility], and log in with your MyChart username and password.  Due to Covid, a mask is required upon entering the hospital/clinic. If you do not have a mask, one will be given to you upon arrival. For doctor visits, patients may have 1 support person aged 42 or older with them. For treatment visits, patients cannot have anyone with them due to current Covid guidelines and our immunocompromised population.

## 2020-11-03 NOTE — Progress Notes (Signed)
Patient presents today for IVIG, patient took pre-medications at home. Per Dr. Delton Coombes, patient okay to proceed with treatment before office visit. Patient tolerated IVIG infusion with no complaints voiced. Peripheral IV site clean and dry with good blood return noted before and after infusion. Band aid applied. VSS with discharge and left in satisfactory condition with no s/s of distress noted.

## 2020-11-03 NOTE — Patient Instructions (Signed)
[place] at [facility] Discharge Instructions  You were seen and examined today by Dr. Delton Coombes. He reviewed your most recent labs and everything looks good except your iron is slightly lower and Hemoglobin is decreased by 2 points. Dr. Delton Coombes recommends you get IV Iron. Please keep appointments as scheduled.   Thank you for choosing [place] at [facility] to provide your oncology and hematology care.  To afford each patient quality time with our provider, please arrive at least 15 minutes before your scheduled appointment time.   If you have a lab appointment with the [facility] please come in thru the Main Entrance and check in at the main information desk.  You need to re-schedule your appointment should you arrive 10 or more minutes late.  We strive to give you quality time with our providers, and arriving late affects you and other patients whose appointments are after yours.  Also, if you no show three or more times for appointments you may be dismissed from the clinic at the providers discretion.     Again, thank you for choosing [facility].  Our hope is that these requests will decrease the amount of time that you wait before being seen by our physicians.       _____________________________________________________________  Should you have questions after your visit to [facility], please contact our office at [phone] and follow the prompts.  Our office hours are 8:00 a.m. and 4:30 p.m. Monday - Friday.  Please note that voicemails left after 4:00 p.m. may not be returned until the following business day.  We are closed weekends and major holidays.  You do have access to a nurse 24-7, just call the main number to the clinic [phone] and do not press any options, hold on the line and a nurse will answer the phone.    For prescription refill requests, have your pharmacy contact our office and allow  72 hours.    Due to Covid, you will need to wear a mask upon entering the hospital. If you do not have a mask, a mask will be given to you at the Main Entrance upon arrival. For doctor visits, patients may have 1 support person age 34 or older with them. For treatment visits, patients can not have anyone with them due to social distancing guidelines and our immunocompromised population.

## 2020-11-04 ENCOUNTER — Encounter (HOSPITAL_COMMUNITY): Payer: Self-pay | Admitting: Hematology

## 2020-11-10 DIAGNOSIS — M545 Low back pain, unspecified: Secondary | ICD-10-CM | POA: Diagnosis not present

## 2020-11-10 DIAGNOSIS — M79672 Pain in left foot: Secondary | ICD-10-CM | POA: Diagnosis not present

## 2020-11-10 DIAGNOSIS — M25561 Pain in right knee: Secondary | ICD-10-CM | POA: Diagnosis not present

## 2020-11-11 ENCOUNTER — Other Ambulatory Visit: Payer: Self-pay | Admitting: *Deleted

## 2020-11-11 DIAGNOSIS — R609 Edema, unspecified: Secondary | ICD-10-CM

## 2020-11-16 DIAGNOSIS — M47812 Spondylosis without myelopathy or radiculopathy, cervical region: Secondary | ICD-10-CM | POA: Diagnosis not present

## 2020-11-16 DIAGNOSIS — M9902 Segmental and somatic dysfunction of thoracic region: Secondary | ICD-10-CM | POA: Diagnosis not present

## 2020-11-16 DIAGNOSIS — M4004 Postural kyphosis, thoracic region: Secondary | ICD-10-CM | POA: Diagnosis not present

## 2020-11-16 DIAGNOSIS — M9901 Segmental and somatic dysfunction of cervical region: Secondary | ICD-10-CM | POA: Diagnosis not present

## 2020-12-02 DIAGNOSIS — M9902 Segmental and somatic dysfunction of thoracic region: Secondary | ICD-10-CM | POA: Diagnosis not present

## 2020-12-02 DIAGNOSIS — M9901 Segmental and somatic dysfunction of cervical region: Secondary | ICD-10-CM | POA: Diagnosis not present

## 2020-12-02 DIAGNOSIS — M4004 Postural kyphosis, thoracic region: Secondary | ICD-10-CM | POA: Diagnosis not present

## 2020-12-02 DIAGNOSIS — M47812 Spondylosis without myelopathy or radiculopathy, cervical region: Secondary | ICD-10-CM | POA: Diagnosis not present

## 2020-12-07 ENCOUNTER — Encounter (HOSPITAL_COMMUNITY): Payer: Medicare Other

## 2020-12-08 ENCOUNTER — Ambulatory Visit: Payer: Medicare Other | Admitting: Physician Assistant

## 2020-12-08 ENCOUNTER — Ambulatory Visit (HOSPITAL_COMMUNITY)
Admission: RE | Admit: 2020-12-08 | Discharge: 2020-12-08 | Disposition: A | Discharge status: Home / Self Care | Payer: Medicare Other | Source: Ambulatory Visit | Attending: Vascular Surgery | Admitting: Vascular Surgery

## 2020-12-08 ENCOUNTER — Other Ambulatory Visit: Payer: Self-pay

## 2020-12-08 VITALS — BP 112/75 | HR 98 | Temp 98.2°F | Resp 20 | Ht 61.0 in | Wt 131.6 lb

## 2020-12-08 DIAGNOSIS — R609 Edema, unspecified: Secondary | ICD-10-CM | POA: Diagnosis not present

## 2020-12-08 NOTE — Progress Notes (Signed)
[facility] [place]   Reason for referral: Swollen right leg  History of Present Illness  [name] is a 65 y.o. female who presents with chief complaint: swollen leg.  Patient notes, onset of swelling  from the hip to the toes 4 weeks ago, associated with nothing.  She states she was seen by an Orthopedic physician and received a right knee injection.  Since the injection her swelling is gone.   The patient has had no LE  history of DVT.  DVT:  Patient had DVT & SVC syndrome from clot around her port-a-cath in 2012. Has been treated with coumadin ever since. Following with Heme-Onc.  positive history of varicose vein, no history of venous stasis ulcers, positive history of  Lymphedema and positive history of skin changes in lower legs.    The patient has  used OTC compression stockings in the past.  She has a history of Non Hodgkin's lymphoma, DM, SVC syndrome, Asthma, hyperlipidemia and essential hypertension.  Past Medical History:  Diagnosis Date   Allergic rhinitis    Anxiety    Asthma    B12 deficiency 01/18/2015   Cataract    Cavitary lung disease    Chronic sinusitis    COPD (chronic obstructive pulmonary disease) (HCC)    Depression    Diffuse large B-cell lymphoma (HCC)    DVT (deep venous thrombosis) ([place]) 09/25/2014   Essential hypertension    GERD (gastroesophageal reflux disease)    Hematuria 04/07/2016   History of pneumonia 01/2013   Hyperlipidemia    Hypogammaglobulinemia, acquired ([place])    Iron deficiency anemia 01/18/2015   Myocardial infarction ([place]) 2011   Narcolepsy without cataplexy(347.00) 01/18/2015   Orofacial dyskinesia 04/22/2015   Osteoporosis    Peripheral neuropathy due to chemotherapy ([place]) 05/02/2016   Sleep apnea    SVC syndrome 09/25/2014    Past Surgical History:  Procedure Laterality Date   ABDOMINAL HYSTERECTOMY     Fibroids   BASAL CELL CARCINOMA EXCISION  03/2011   scalp   BREAST SURGERY Right    biopsy    CATARACT EXTRACTION W/PHACO Left 11/17/2014   Procedure: CATARACT EXTRACTION PHACO AND INTRAOCULAR LENS PLACEMENT LEFT EYE;  Surgeon: Tonny Branch, MD;  Location: [facility] ORS;  Service: Ophthalmology;  Laterality: Left;  CDE:5.60   CATARACT EXTRACTION W/PHACO Right 12/15/2014   Procedure: CATARACT EXTRACTION PHACO AND INTRAOCULAR LENS PLACEMENT RIGHT EYE CDE=5.16;  Surgeon: Tonny Branch, MD;  Location: [facility] ORS;  Service: Ophthalmology;  Laterality: Right;   COLONOSCOPY N/A 11/14/2012   Procedure: COLONOSCOPY;  Surgeon: Rogene Houston, MD;  Location: [facility] ENDO SUITE;  Service: Endoscopy;  Laterality: N/A;  830   COLONOSCOPY WITH PROPOFOL N/A 01/12/2018   Procedure: COLONOSCOPY WITH PROPOFOL;  Surgeon: Rogene Houston, MD;  Location: [facility] END[facility] SUITE;  Service: Endoscopy;  Laterality: N/A;  7:30   EYE SURGERY     NASAL SINUS SURGERY     NECK SURGERY     x 2    PERIPHERALLY INSERTED CENTRAL CATHETER INSERTION     PICC Removal     POLYPECTOMY  01/12/2018   Procedure: POLYPECTOMY;  Surgeon: Rogene Houston, MD;  Location: [facility] END[facility] SU[facility];  Service: Endoscopy;;  colon   PORT-A-CATH REMOVAL     PORTACATH PLACEMENT  7/11   Removed 6/12   TRACHEOSTOMY     VESICOVAGINAL FISTULA CLOSURE W/ TAH      Social History   Socioeconomic History   Marital status: Divorced  Spouse name: Not on file   Number of children: 3   Years of education: 21   Highest education level: Not on file  Occupational History   Occupation: teller    Comment: [employer]  Tobacco Use   Smoking status: Never   Smokeless tobacco: Never  Vaping Use   Vaping Use: Never used  Substance and Sexual Activity   Alcohol use: No   Drug use: No   Sexual activity: Not on file    Comment: hyst  Other Topics Concern   Not on file  Social History Narrative   Originally from [place]. Always lived in [place]. Prior travel to [place]. 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 She has 1 dog  currently. She has 1 conures (small parrots). No mold exposure in her home. 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 No hot tub exposure. She enjoys sewing & quilting.    Social Determinants of Health   Financial Resource Strain: Not on file  Food Insecurity: Not on file  Transportation Needs: Not on file  Physical Activity: Not on file  Stress: Not on file  Social Connections: Not on file  Intimate Partner Violence: Not on file    Family History  Problem Relation Age of Onset   Emphysema Mother    Stroke Mother    COPD Mother    Heart disease Mother        died in sleep  36   Allergies Father    Asthma Father        as a child   Arthritis Father    Parkinson's disease Father 52   Leukemia Maternal Grandmother    Cancer Maternal Grandmother        Leukemia   Diabetes Brother    Heart attack Brother    Hypertension Brother    Heart disease Brother 40       stents   Hypertension Sister    Hypertension Brother     Current Outpatient Medications on File Prior to Visit  Medication Sig Dispense Refill   acetaminophen (TYLENOL) 500 MG tablet Take 1,000 mg by mouth every 6 (six) hours as needed for moderate pain or headache.      acyclovir (ZOVIRAX) 400 MG tablet Take 1 tablet (400 mg total) by mouth 2 (two) times daily. 60 tablet 6   albuterol (PROAIR HFA) 108 (90 Base) MCG/ACT inhaler Inhale 2 puffs into the lungs every 6 (six) hours as needed. (Patient taking differently: Inhale 2 puffs into the lungs every 6 (six) hours as needed for wheezing or shortness of breath.) 8.5 g 2   albuterol (PROVENTIL) (2.5 MG/3ML) 0.083% nebulizer solution Take 2.5 mg by nebulization every 4 (four) hours as needed for wheezing or shortness of breath.     amphetamine-dextroamphetamine (ADDERALL) 10 MG tablet Take 10 mg by mouth every morning.   0   Armodafinil 150 MG tablet Take 150 mg by mouth every morning.      aspirin EC 81 MG tablet Take 81 mg by mouth daily. Swallow whole.      budesonide-formoterol (SYMBICORT) 160-4.5 MCG/ACT inhaler Inhale 2 puffs into the lungs 2 (two) times daily. 1 Inhaler 5   celecoxib (CELEBREX) 200 MG capsule Take 200 mg by mouth 2 (two) times daily as needed.     Cholecalciferol (VITAMIN D3 PO) Take 1 capsule by mouth at bedtime.     citalopram (CELEXA)  20 MG tablet Take 20 mg by mouth every morning.     cyanocobalamin 1000 MCG tablet Take 1,000 mcg by mouth daily.     furosemide (LASIX) 20 MG tablet Take 1 tablet (20 mg total) by mouth as needed for fluid. 90 tablet 1   gabapentin (NEURONTIN) 600 MG tablet 300 mg 2 (two) times daily.      olmesartan (BENICAR) 40 MG tablet Take 20-40 mg by mouth daily.     omeprazole (PRILOSEC) 20 MG capsule TAKE ONE CAPSULE BY MOUTH TWICE DAILY BEFORE A MEAL. 180 capsule 0   predniSONE (DELTASONE) 5 MG tablet TAKE 1 TABLET BY MOUTH ONCE A DAY. 30 tablet 0   simvastatin (ZOCOR) 80 MG tablet Take 1 tablet (80 mg total) by mouth at bedtime. 90 tablet 3   zafirlukast (ACCOLATE) 20 MG tablet TAKE 1 TABLET BY MOUTH TWICE DAILY. 60 tablet 0   Current Facility-Administered Medications on File Prior to Visit  Medication Dose Route Frequency Provider Last Rate Last Admin   0.9 %  sodium chloride infusion   Intravenous Once Kefalas, Thomas S, PA-C       0.9 %  sodium chloride infusion   Intravenous Once Kefalas, Thomas S, PA-C       acetaminophen (TYLENOL) tablet 650 mg  650 mg Oral Q6H PRN Kefalas, Thomas S, PA-C       acetaminophen (TYLENOL) tablet 650 mg  650 mg Oral Q6H PRN Kefalas, Manon Hilding, PA-C       dextrose 5 % solution   Intravenous Continuous Kefalas, Manon Hilding, PA-C   Stopped at 08/25/15 1400   dextrose 5 % solution   Intravenous Continuous Kefalas, Manon Hilding, PA-C   Stopped at 07/13/16 1350   dextrose 5 % solution   Intravenous Continuous Holley Bouche, NP   Stopped at 02/28/17 1230   diphenhydrAMINE (BENADRYL) capsule 25 mg  25 mg Oral Once Kefalas, Thomas S, PA-C       diphenhydrAMINE (BENADRYL)  capsule 25 mg  25 mg Oral Once Kefalas, Thomas S, PA-C       sodium chloride 0.9 % injection 10 mL  10 mL Intracatheter PRN Baird Cancer, PA-C   10 mL at 08/25/15 1125    Allergies as of 12/08/2020 - Review Complete 11/03/2020  Allergen Reaction Noted   Meperidine hcl Anaphylaxis    Montelukast sodium Hives and Rash      ROS:   General:  No weight loss, Fever, chills  HEENT: No recent headaches, no nasal bleeding, no visual changes, no sore throat  Neurologic: No dizziness, blackouts, seizures. No recent symptoms of stroke or mini- stroke. No recent episodes of slurred speech, or temporary blindness.  Cardiac: No recent episodes of chest pain/pressure, no shortness of breath at rest.  No shortness of breath with exertion.  Denies history of atrial fibrillation or irregular heartbeat  Vascular: No history of rest pain in feet.  No history of claudication.  No history of non-healing ulcer, No history of DVT   Pulmonary: No home oxygen, no productive cough, no hemoptysis,  No asthma or wheezing  Musculoskeletal:  [ ]  Arthritis, [ ]  Low back pain,  [ ]  Joint pain  Hematologic:No history of hypercoagulable state.  No history of easy bleeding.  No history of anemia  Gastrointestinal: No hematochezia or melena,  No gastroesophageal reflux, no trouble swallowing  Urinary: [ ]  chronic Kidney disease, [ ]  on HD - [ ]  MWF or [ ]  TTHS, [ ]  Burning  with urination, [ ]  Frequent urination, [ ]  Difficulty urinating;   Skin: No rashes, darkening of the skin in UE/LE's   Psychological: No history of anxiety,  No history of depression  Physical Examination  Vitals:   12/08/20 0821  BP: 112/75  Pulse: 98  Resp: 20  Temp: 98.2 F (36.8 C)  TempSrc: Temporal  SpO2: 96%  Weight: 131 lb 9.6 oz (59.7 kg)  Height: 5\' 1"  (1.549 m)    Body mass index is 24.87 kg/m.  General:  Alert and oriented, no acute distress HEENT: Normal Neck: No bruit or JVD Pulmonary: Inspirational wheezing   auscultation bilaterally Cardiac: Regular Rate and Rhythm without murmur Abdomen: Soft, non-tender, non-distended, no mass, no scars Skin: No rash, darkening skin UE/LE's  Extremity Pulses:  2+ radial, brachial, femoral, dorsalis pedis,  pulses bilaterally Musculoskeletal: No deformity or edema  Neurologic: Upper and lower extremity motor 5/5 and symmetric  DATA:    Venous Reflux Times  +--------------+---------+------+-----------+------------+--------+  RIGHT         Reflux NoRefluxReflux TimeDiameter cmsComments                          Yes                                   +--------------+---------+------+-----------+------------+--------+  CFV           no                                              +--------------+---------+------+-----------+------------+--------+  FV mid        no                                              +--------------+---------+------+-----------+------------+--------+  Popliteal     no                                              +--------------+---------+------+-----------+------------+--------+  GSV at [facility]    no                            0.80              +--------------+---------+------+-----------+------------+--------+  GSV prox thighno                            0.28              +--------------+---------+------+-----------+------------+--------+  GSV mid thigh no                            0.27              +--------------+---------+------+-----------+------------+--------+  GSV dist thighno                            0.19              +--------------+---------+------+-----------+------------+--------+  GSV at knee   no                            0.17              +--------------+---------+------+-----------+------------+--------+  SSV Pop Fossa no                            0.15              +--------------+---------+------+-----------+------------+--------+            Summary:  Right:  - No evidence of deep vein thrombosis from the common femoral through the  popliteal veins.  - No evidence of superficial venous thrombosis.  - The deep venous system is competent.  - The great and small saphenous veins are competent.     Assessment/Plan: Recent history of right LE edema.  Hip to toes.  Maybe lymphedema.  She states she received a right knee injection and the swelling went away. She has no evidence of venous reflux on right LE duplex today.  She has palpable DP pedal pulses B.  She is not at risk of limb loss.   I gave her a handout for edema management.  Elevation when at rest, exercise and OTC compression.  F/U as needed.    Roxy Horseman PA-C [facility] Office: [phone]  MD in clinic [place]

## 2020-12-14 DIAGNOSIS — J329 Chronic sinusitis, unspecified: Secondary | ICD-10-CM | POA: Diagnosis not present

## 2020-12-15 DIAGNOSIS — M4004 Postural kyphosis, thoracic region: Secondary | ICD-10-CM | POA: Diagnosis not present

## 2020-12-15 DIAGNOSIS — M9901 Segmental and somatic dysfunction of cervical region: Secondary | ICD-10-CM | POA: Diagnosis not present

## 2020-12-15 DIAGNOSIS — M47812 Spondylosis without myelopathy or radiculopathy, cervical region: Secondary | ICD-10-CM | POA: Diagnosis not present

## 2020-12-15 DIAGNOSIS — M9902 Segmental and somatic dysfunction of thoracic region: Secondary | ICD-10-CM | POA: Diagnosis not present

## 2020-12-17 DIAGNOSIS — H5213 Myopia, bilateral: Secondary | ICD-10-CM | POA: Diagnosis not present

## 2020-12-25 ENCOUNTER — Inpatient Hospital Stay (HOSPITAL_COMMUNITY): Payer: Medicare Other | Attending: Hematology

## 2020-12-25 ENCOUNTER — Other Ambulatory Visit: Payer: Self-pay

## 2020-12-25 DIAGNOSIS — D801 Nonfamilial hypogammaglobulinemia: Secondary | ICD-10-CM | POA: Diagnosis not present

## 2020-12-25 DIAGNOSIS — C8338 Diffuse large B-cell lymphoma, lymph nodes of multiple sites: Secondary | ICD-10-CM

## 2020-12-25 LAB — CBC WITH DIFFERENTIAL/PLATELET
Abs Immature Granulocytes: 0.03 10*3/uL (ref 0.00–0.07)
Basophils Absolute: 0 10*3/uL (ref 0.0–0.1)
Basophils Relative: 1 %
Eosinophils Absolute: 0.1 10*3/uL (ref 0.0–0.5)
Eosinophils Relative: 2 %
HCT: 39.6 % (ref 36.0–46.0)
Hemoglobin: 12.7 g/dL (ref 12.0–15.0)
Immature Granulocytes: 0 %
Lymphocytes Relative: 16 %
Lymphs Abs: 1.4 10*3/uL (ref 0.7–4.0)
MCH: 31.1 pg (ref 26.0–34.0)
MCHC: 32.1 g/dL (ref 30.0–36.0)
MCV: 96.8 fL (ref 80.0–100.0)
Monocytes Absolute: 0.6 10*3/uL (ref 0.1–1.0)
Monocytes Relative: 7 %
Neutro Abs: 6.4 10*3/uL (ref 1.7–7.7)
Neutrophils Relative %: 74 %
Platelets: 190 10*3/uL (ref 150–400)
RBC: 4.09 MIL/uL (ref 3.87–5.11)
RDW: 12.6 % (ref 11.5–15.5)
WBC: 8.6 10*3/uL (ref 4.0–10.5)
nRBC: 0 % (ref 0.0–0.2)

## 2020-12-25 LAB — COMPREHENSIVE METABOLIC PANEL
ALT: 10 U/L (ref 0–44)
AST: 16 U/L (ref 15–41)
Albumin: 3.4 g/dL — ABNORMAL LOW (ref 3.5–5.0)
Alkaline Phosphatase: 116 U/L (ref 38–126)
Anion gap: 9 (ref 5–15)
BUN: 15 mg/dL (ref 8–23)
CO2: 28 mmol/L (ref 22–32)
Calcium: 8.6 mg/dL — ABNORMAL LOW (ref 8.9–10.3)
Chloride: 101 mmol/L (ref 98–111)
Creatinine, Ser: 0.84 mg/dL (ref 0.44–1.00)
GFR, Estimated: >60 mL/min (ref >60)
Glucose, Bld: 86 mg/dL (ref 70–99)
Potassium: 3.9 mmol/L (ref 3.5–5.1)
Sodium: 138 mmol/L (ref 135–145)
Total Bilirubin: 0.3 mg/dL (ref 0.3–1.2)
Total Protein: 7.2 g/dL (ref 6.5–8.1)

## 2020-12-26 LAB — IGG, IGA, IGM
IgA: <5 mg/dL — ABNORMAL LOW (ref 87–352)
IgG (Immunoglobin G), Serum: 1367 mg/dL (ref 586–1602)
IgM (Immunoglobulin M), Srm: 109 mg/dL (ref 26–217)

## 2020-12-29 ENCOUNTER — Other Ambulatory Visit: Payer: Self-pay

## 2020-12-29 ENCOUNTER — Encounter (HOSPITAL_COMMUNITY): Payer: Self-pay

## 2020-12-29 ENCOUNTER — Inpatient Hospital Stay (HOSPITAL_COMMUNITY): Payer: Medicare Other

## 2020-12-29 VITALS — BP 161/76 | HR 67 | Temp 97.9°F | Resp 18 | Wt 135.4 lb

## 2020-12-29 DIAGNOSIS — D801 Nonfamilial hypogammaglobulinemia: Secondary | ICD-10-CM | POA: Diagnosis not present

## 2020-12-29 MED ORDER — IMMUNE GLOBULIN (HUMAN) 10 GM/100ML IV SOLN
30.0000 g | Freq: Once | INTRAVENOUS | Status: AC
  Administered 2020-12-29: 09:00:00 30 g via INTRAVENOUS
  Filled 2020-12-29: qty 200

## 2020-12-29 MED ORDER — DEXTROSE 5 % IV SOLN: INTRAVENOUS | Status: DC

## 2020-12-29 NOTE — Progress Notes (Signed)
Pt here for IVIG, Pt did take tylenol and benadryl at home prior to coming to hospital.   Tolerated treatment well today without incidence.  Stable during and after treatment.  AVS reviewed.  Vital signs stable prior to discharge.  Discharged in stable condition ambulatory.

## 2020-12-29 NOTE — Patient Instructions (Signed)
[facility]  Discharge Instructions: Thank you for choosing [facility] to provide your oncology and hematology care.  If you have a lab appointment with the [facility], please come in thru the Main Entrance and check in at the main information desk.  Wear comfortable clothing and clothing appropriate for easy access to any Portacath or PICC line.   We strive to give you quality time with your provider. You may need to reschedule your appointment if you arrive late (15 or more minutes).  Arriving late affects you and other patients whose appointments are after yours.  Also, if you miss three or more appointments without notifying the office, you may be dismissed from the clinic at the providers discretion.      For prescription refill requests, have your pharmacy contact our office and allow 72 hours for refills to be completed.    Today you received the following chemotherapy and/or immunotherapy agents IVIG      To help prevent nausea and vomiting after your treatment, we encourage you to take your nausea medication as directed.  BELOW ARE SYMPTOMS THAT SHOULD BE REPORTED IMMEDIATELY: *FEVER GREATER THAN 100.4 F (38 C) OR HIGHER *CHILLS OR SWEATING *NAUSEA AND VOMITING THAT IS NOT CONTROLLED WITH YOUR NAUSEA MEDICATION *UNUSUAL SHORTNESS OF BREATH *UNUSUAL BRUISING OR BLEEDING *URINARY PROBLEMS (pain or burning when urinating, or frequent urination) *BOWEL PROBLEMS (unusual diarrhea, constipation, pain near the anus) TENDERNESS IN MOUTH AND THROAT WITH OR WITHOUT PRESENCE OF ULCERS (sore throat, sores in mouth, or a toothache) UNUSUAL RASH, SWELLING OR PAIN  UNUSUAL VAGINAL DISCHARGE OR ITCHING   Items with * indicate a potential emergency and should be followed up as soon as possible or go to the Emergency Department if any problems should occur.  Please show the CHEMOTHERAPY ALERT CARD or IMMUNOTHERAPY ALERT CARD at check-in to the Emergency Department  and triage nurse.  Should you have questions after your visit or need to cancel or reschedule your appointment, please contact [facility] [phone]  and follow the prompts.  Office hours are 8:00 a.m. to 4:30 p.m. Monday - Friday. Please note that voicemails left after 4:00 p.m. may not be returned until the following business day.  We are closed weekends and major holidays. You have access to a nurse at all times for urgent questions. Please call the main number to the clinic [phone] and follow the prompts.  For any non-urgent questions, you may also contact your provider using MyChart. We now offer e-Visits for anyone 34 and older to request care online for non-urgent symptoms. For details visit [email or web address].   Also download the MyChart app! Go to the app store, search "MyChart", open the app, select [facility], and log in with your MyChart username and password.  Due to Covid, a mask is required upon entering the hospital/clinic. If you do not have a mask, one will be given to you upon arrival. For doctor visits, patients may have 1 support person aged 91 or older with them. For treatment visits, patients cannot have anyone with them due to current Covid guidelines and our immunocompromised population.

## 2020-12-30 DIAGNOSIS — M9901 Segmental and somatic dysfunction of cervical region: Secondary | ICD-10-CM | POA: Diagnosis not present

## 2020-12-30 DIAGNOSIS — M4004 Postural kyphosis, thoracic region: Secondary | ICD-10-CM | POA: Diagnosis not present

## 2020-12-30 DIAGNOSIS — M9902 Segmental and somatic dysfunction of thoracic region: Secondary | ICD-10-CM | POA: Diagnosis not present

## 2020-12-30 DIAGNOSIS — M47812 Spondylosis without myelopathy or radiculopathy, cervical region: Secondary | ICD-10-CM | POA: Diagnosis not present

## 2021-01-07 DIAGNOSIS — Z6823 Body mass index (BMI) 23.0-23.9, adult: Secondary | ICD-10-CM | POA: Diagnosis not present

## 2021-01-07 DIAGNOSIS — N390 Urinary tract infection, site not specified: Secondary | ICD-10-CM | POA: Diagnosis not present

## 2021-01-07 DIAGNOSIS — M2042 Other hammer toe(s) (acquired), left foot: Secondary | ICD-10-CM | POA: Diagnosis not present

## 2021-01-07 DIAGNOSIS — L603 Nail dystrophy: Secondary | ICD-10-CM | POA: Diagnosis not present

## 2021-01-07 DIAGNOSIS — E663 Overweight: Secondary | ICD-10-CM | POA: Diagnosis not present

## 2021-01-07 DIAGNOSIS — M79675 Pain in left toe(s): Secondary | ICD-10-CM | POA: Diagnosis not present

## 2021-01-13 DIAGNOSIS — M9902 Segmental and somatic dysfunction of thoracic region: Secondary | ICD-10-CM | POA: Diagnosis not present

## 2021-01-13 DIAGNOSIS — M4004 Postural kyphosis, thoracic region: Secondary | ICD-10-CM | POA: Diagnosis not present

## 2021-01-13 DIAGNOSIS — M47812 Spondylosis without myelopathy or radiculopathy, cervical region: Secondary | ICD-10-CM | POA: Diagnosis not present

## 2021-01-13 DIAGNOSIS — M9901 Segmental and somatic dysfunction of cervical region: Secondary | ICD-10-CM | POA: Diagnosis not present

## 2021-01-27 DIAGNOSIS — M47812 Spondylosis without myelopathy or radiculopathy, cervical region: Secondary | ICD-10-CM | POA: Diagnosis not present

## 2021-01-27 DIAGNOSIS — M4004 Postural kyphosis, thoracic region: Secondary | ICD-10-CM | POA: Diagnosis not present

## 2021-01-27 DIAGNOSIS — M9902 Segmental and somatic dysfunction of thoracic region: Secondary | ICD-10-CM | POA: Diagnosis not present

## 2021-01-27 DIAGNOSIS — M9901 Segmental and somatic dysfunction of cervical region: Secondary | ICD-10-CM | POA: Diagnosis not present

## 2021-01-29 DIAGNOSIS — M4004 Postural kyphosis, thoracic region: Secondary | ICD-10-CM | POA: Diagnosis not present

## 2021-01-29 DIAGNOSIS — M47812 Spondylosis without myelopathy or radiculopathy, cervical region: Secondary | ICD-10-CM | POA: Diagnosis not present

## 2021-01-29 DIAGNOSIS — M9901 Segmental and somatic dysfunction of cervical region: Secondary | ICD-10-CM | POA: Diagnosis not present

## 2021-01-29 DIAGNOSIS — M9902 Segmental and somatic dysfunction of thoracic region: Secondary | ICD-10-CM | POA: Diagnosis not present

## 2021-02-01 DIAGNOSIS — M4004 Postural kyphosis, thoracic region: Secondary | ICD-10-CM | POA: Diagnosis not present

## 2021-02-01 DIAGNOSIS — M9901 Segmental and somatic dysfunction of cervical region: Secondary | ICD-10-CM | POA: Diagnosis not present

## 2021-02-01 DIAGNOSIS — M9902 Segmental and somatic dysfunction of thoracic region: Secondary | ICD-10-CM | POA: Diagnosis not present

## 2021-02-01 DIAGNOSIS — M47812 Spondylosis without myelopathy or radiculopathy, cervical region: Secondary | ICD-10-CM | POA: Diagnosis not present

## 2021-02-03 DIAGNOSIS — M9901 Segmental and somatic dysfunction of cervical region: Secondary | ICD-10-CM | POA: Diagnosis not present

## 2021-02-03 DIAGNOSIS — M9902 Segmental and somatic dysfunction of thoracic region: Secondary | ICD-10-CM | POA: Diagnosis not present

## 2021-02-03 DIAGNOSIS — M4004 Postural kyphosis, thoracic region: Secondary | ICD-10-CM | POA: Diagnosis not present

## 2021-02-03 DIAGNOSIS — M47812 Spondylosis without myelopathy or radiculopathy, cervical region: Secondary | ICD-10-CM | POA: Diagnosis not present

## 2021-02-04 ENCOUNTER — Other Ambulatory Visit (HOSPITAL_COMMUNITY): Payer: Self-pay | Admitting: Family Medicine

## 2021-02-04 DIAGNOSIS — Z1231 Encounter for screening mammogram for malignant neoplasm of breast: Secondary | ICD-10-CM

## 2021-02-05 DIAGNOSIS — M4004 Postural kyphosis, thoracic region: Secondary | ICD-10-CM | POA: Diagnosis not present

## 2021-02-05 DIAGNOSIS — M47812 Spondylosis without myelopathy or radiculopathy, cervical region: Secondary | ICD-10-CM | POA: Diagnosis not present

## 2021-02-05 DIAGNOSIS — M9902 Segmental and somatic dysfunction of thoracic region: Secondary | ICD-10-CM | POA: Diagnosis not present

## 2021-02-05 DIAGNOSIS — M9901 Segmental and somatic dysfunction of cervical region: Secondary | ICD-10-CM | POA: Diagnosis not present

## 2021-02-08 DIAGNOSIS — M4004 Postural kyphosis, thoracic region: Secondary | ICD-10-CM | POA: Diagnosis not present

## 2021-02-08 DIAGNOSIS — M9901 Segmental and somatic dysfunction of cervical region: Secondary | ICD-10-CM | POA: Diagnosis not present

## 2021-02-08 DIAGNOSIS — M47812 Spondylosis without myelopathy or radiculopathy, cervical region: Secondary | ICD-10-CM | POA: Diagnosis not present

## 2021-02-08 DIAGNOSIS — M9902 Segmental and somatic dysfunction of thoracic region: Secondary | ICD-10-CM | POA: Diagnosis not present

## 2021-02-10 DIAGNOSIS — M47812 Spondylosis without myelopathy or radiculopathy, cervical region: Secondary | ICD-10-CM | POA: Diagnosis not present

## 2021-02-10 DIAGNOSIS — M9901 Segmental and somatic dysfunction of cervical region: Secondary | ICD-10-CM | POA: Diagnosis not present

## 2021-02-10 DIAGNOSIS — M4004 Postural kyphosis, thoracic region: Secondary | ICD-10-CM | POA: Diagnosis not present

## 2021-02-10 DIAGNOSIS — M9902 Segmental and somatic dysfunction of thoracic region: Secondary | ICD-10-CM | POA: Diagnosis not present

## 2021-02-11 ENCOUNTER — Other Ambulatory Visit: Payer: Self-pay

## 2021-02-11 ENCOUNTER — Ambulatory Visit (HOSPITAL_COMMUNITY)
Admission: RE | Admit: 2021-02-11 | Discharge: 2021-02-11 | Disposition: A | Discharge status: Home / Self Care | Payer: Medicare Other | Source: Ambulatory Visit | Attending: Family Medicine | Admitting: Family Medicine

## 2021-02-11 DIAGNOSIS — Z1231 Encounter for screening mammogram for malignant neoplasm of breast: Secondary | ICD-10-CM | POA: Diagnosis not present

## 2021-02-12 DIAGNOSIS — M47812 Spondylosis without myelopathy or radiculopathy, cervical region: Secondary | ICD-10-CM | POA: Diagnosis not present

## 2021-02-12 DIAGNOSIS — M9901 Segmental and somatic dysfunction of cervical region: Secondary | ICD-10-CM | POA: Diagnosis not present

## 2021-02-12 DIAGNOSIS — M9902 Segmental and somatic dysfunction of thoracic region: Secondary | ICD-10-CM | POA: Diagnosis not present

## 2021-02-12 DIAGNOSIS — M4004 Postural kyphosis, thoracic region: Secondary | ICD-10-CM | POA: Diagnosis not present

## 2021-02-15 DIAGNOSIS — M9902 Segmental and somatic dysfunction of thoracic region: Secondary | ICD-10-CM | POA: Diagnosis not present

## 2021-02-15 DIAGNOSIS — M4004 Postural kyphosis, thoracic region: Secondary | ICD-10-CM | POA: Diagnosis not present

## 2021-02-15 DIAGNOSIS — M9901 Segmental and somatic dysfunction of cervical region: Secondary | ICD-10-CM | POA: Diagnosis not present

## 2021-02-15 DIAGNOSIS — M47812 Spondylosis without myelopathy or radiculopathy, cervical region: Secondary | ICD-10-CM | POA: Diagnosis not present

## 2021-02-16 DIAGNOSIS — D239 Other benign neoplasm of skin, unspecified: Secondary | ICD-10-CM | POA: Diagnosis not present

## 2021-02-16 DIAGNOSIS — Z85828 Personal history of other malignant neoplasm of skin: Secondary | ICD-10-CM | POA: Diagnosis not present

## 2021-02-16 DIAGNOSIS — Z1283 Encounter for screening for malignant neoplasm of skin: Secondary | ICD-10-CM | POA: Diagnosis not present

## 2021-02-17 DIAGNOSIS — M47812 Spondylosis without myelopathy or radiculopathy, cervical region: Secondary | ICD-10-CM | POA: Diagnosis not present

## 2021-02-17 DIAGNOSIS — M4004 Postural kyphosis, thoracic region: Secondary | ICD-10-CM | POA: Diagnosis not present

## 2021-02-17 DIAGNOSIS — M9901 Segmental and somatic dysfunction of cervical region: Secondary | ICD-10-CM | POA: Diagnosis not present

## 2021-02-17 DIAGNOSIS — M9902 Segmental and somatic dysfunction of thoracic region: Secondary | ICD-10-CM | POA: Diagnosis not present

## 2021-02-22 ENCOUNTER — Inpatient Hospital Stay (HOSPITAL_COMMUNITY): Payer: Medicare Other | Attending: Hematology

## 2021-02-22 DIAGNOSIS — C8338 Diffuse large B-cell lymphoma, lymph nodes of multiple sites: Secondary | ICD-10-CM

## 2021-02-22 DIAGNOSIS — Z836 Family history of other diseases of the respiratory system: Secondary | ICD-10-CM | POA: Diagnosis not present

## 2021-02-22 DIAGNOSIS — Z86718 Personal history of other venous thrombosis and embolism: Secondary | ICD-10-CM | POA: Insufficient documentation

## 2021-02-22 DIAGNOSIS — D649 Anemia, unspecified: Secondary | ICD-10-CM | POA: Insufficient documentation

## 2021-02-22 DIAGNOSIS — M2550 Pain in unspecified joint: Secondary | ICD-10-CM | POA: Diagnosis not present

## 2021-02-22 DIAGNOSIS — I252 Old myocardial infarction: Secondary | ICD-10-CM | POA: Diagnosis not present

## 2021-02-22 DIAGNOSIS — D801 Nonfamilial hypogammaglobulinemia: Secondary | ICD-10-CM | POA: Insufficient documentation

## 2021-02-22 DIAGNOSIS — Z79899 Other long term (current) drug therapy: Secondary | ICD-10-CM | POA: Insufficient documentation

## 2021-02-22 DIAGNOSIS — I10 Essential (primary) hypertension: Secondary | ICD-10-CM | POA: Diagnosis not present

## 2021-02-22 DIAGNOSIS — Z8249 Family history of ischemic heart disease and other diseases of the circulatory system: Secondary | ICD-10-CM | POA: Diagnosis not present

## 2021-02-22 DIAGNOSIS — C8590 Non-Hodgkin lymphoma, unspecified, unspecified site: Secondary | ICD-10-CM | POA: Diagnosis not present

## 2021-02-22 DIAGNOSIS — Z806 Family history of leukemia: Secondary | ICD-10-CM | POA: Diagnosis not present

## 2021-02-22 DIAGNOSIS — Z85828 Personal history of other malignant neoplasm of skin: Secondary | ICD-10-CM | POA: Diagnosis not present

## 2021-02-22 DIAGNOSIS — Z833 Family history of diabetes mellitus: Secondary | ICD-10-CM | POA: Diagnosis not present

## 2021-02-22 DIAGNOSIS — Z823 Family history of stroke: Secondary | ICD-10-CM | POA: Diagnosis not present

## 2021-02-22 DIAGNOSIS — Z818 Family history of other mental and behavioral disorders: Secondary | ICD-10-CM | POA: Insufficient documentation

## 2021-02-22 DIAGNOSIS — Z8261 Family history of arthritis: Secondary | ICD-10-CM | POA: Insufficient documentation

## 2021-02-22 LAB — CBC WITH DIFFERENTIAL/PLATELET
Abs Immature Granulocytes: 0.01 10*3/uL (ref 0.00–0.07)
Basophils Absolute: 0.1 10*3/uL (ref 0.0–0.1)
Basophils Relative: 1 %
Eosinophils Absolute: 0.2 10*3/uL (ref 0.0–0.5)
Eosinophils Relative: 3 %
HCT: 37.1 % (ref 36.0–46.0)
Hemoglobin: 12 g/dL (ref 12.0–15.0)
Immature Granulocytes: 0 %
Lymphocytes Relative: 25 %
Lymphs Abs: 1.9 10*3/uL (ref 0.7–4.0)
MCH: 31.5 pg (ref 26.0–34.0)
MCHC: 32.3 g/dL (ref 30.0–36.0)
MCV: 97.4 fL (ref 80.0–100.0)
Monocytes Absolute: 0.6 10*3/uL (ref 0.1–1.0)
Monocytes Relative: 8 %
Neutro Abs: 4.8 10*3/uL (ref 1.7–7.7)
Neutrophils Relative %: 63 %
Platelets: 180 10*3/uL (ref 150–400)
RBC: 3.81 MIL/uL — ABNORMAL LOW (ref 3.87–5.11)
RDW: 13 % (ref 11.5–15.5)
WBC: 7.7 10*3/uL (ref 4.0–10.5)
nRBC: 0 % (ref 0.0–0.2)

## 2021-02-22 LAB — COMPREHENSIVE METABOLIC PANEL
ALT: 8 U/L (ref 0–44)
AST: 14 U/L — ABNORMAL LOW (ref 15–41)
Albumin: 3.5 g/dL (ref 3.5–5.0)
Alkaline Phosphatase: 117 U/L (ref 38–126)
Anion gap: 8 (ref 5–15)
BUN: 18 mg/dL (ref 8–23)
CO2: 30 mmol/L (ref 22–32)
Calcium: 8.5 mg/dL — ABNORMAL LOW (ref 8.9–10.3)
Chloride: 102 mmol/L (ref 98–111)
Creatinine, Ser: 0.88 mg/dL (ref 0.44–1.00)
GFR, Estimated: >60 mL/min (ref >60)
Glucose, Bld: 94 mg/dL (ref 70–99)
Potassium: 3.6 mmol/L (ref 3.5–5.1)
Sodium: 140 mmol/L (ref 135–145)
Total Bilirubin: 0.5 mg/dL (ref 0.3–1.2)
Total Protein: 6.7 g/dL (ref 6.5–8.1)

## 2021-02-22 LAB — IRON AND TIBC
Iron: 62 ug/dL (ref 28–170)
Saturation Ratios: 27 % (ref 10.4–31.8)
TIBC: 233 ug/dL — ABNORMAL LOW (ref 250–450)
UIBC: 171 ug/dL

## 2021-02-22 LAB — FERRITIN: Ferritin: 64 ng/mL (ref 11–307)

## 2021-02-23 ENCOUNTER — Ambulatory Visit (HOSPITAL_COMMUNITY): Payer: Medicare Other

## 2021-02-23 ENCOUNTER — Ambulatory Visit (HOSPITAL_COMMUNITY): Payer: Medicare Other | Admitting: Hematology

## 2021-02-23 LAB — IGG, IGA, IGM
IgA: <5 mg/dL — ABNORMAL LOW (ref 87–352)
IgG (Immunoglobin G), Serum: 1320 mg/dL (ref 586–1602)
IgM (Immunoglobulin M), Srm: 81 mg/dL (ref 26–217)

## 2021-02-23 IMAGING — DX DG CHEST 2V
2 series · 2 of 2 positions shown · non-contrast
Comparison: Chest x-ray 05/20/2016.

CLINICAL DATA: 64-year-old female with history of cough and fever.

EXAM:
CHEST - 2 VIEW

[chest pa]
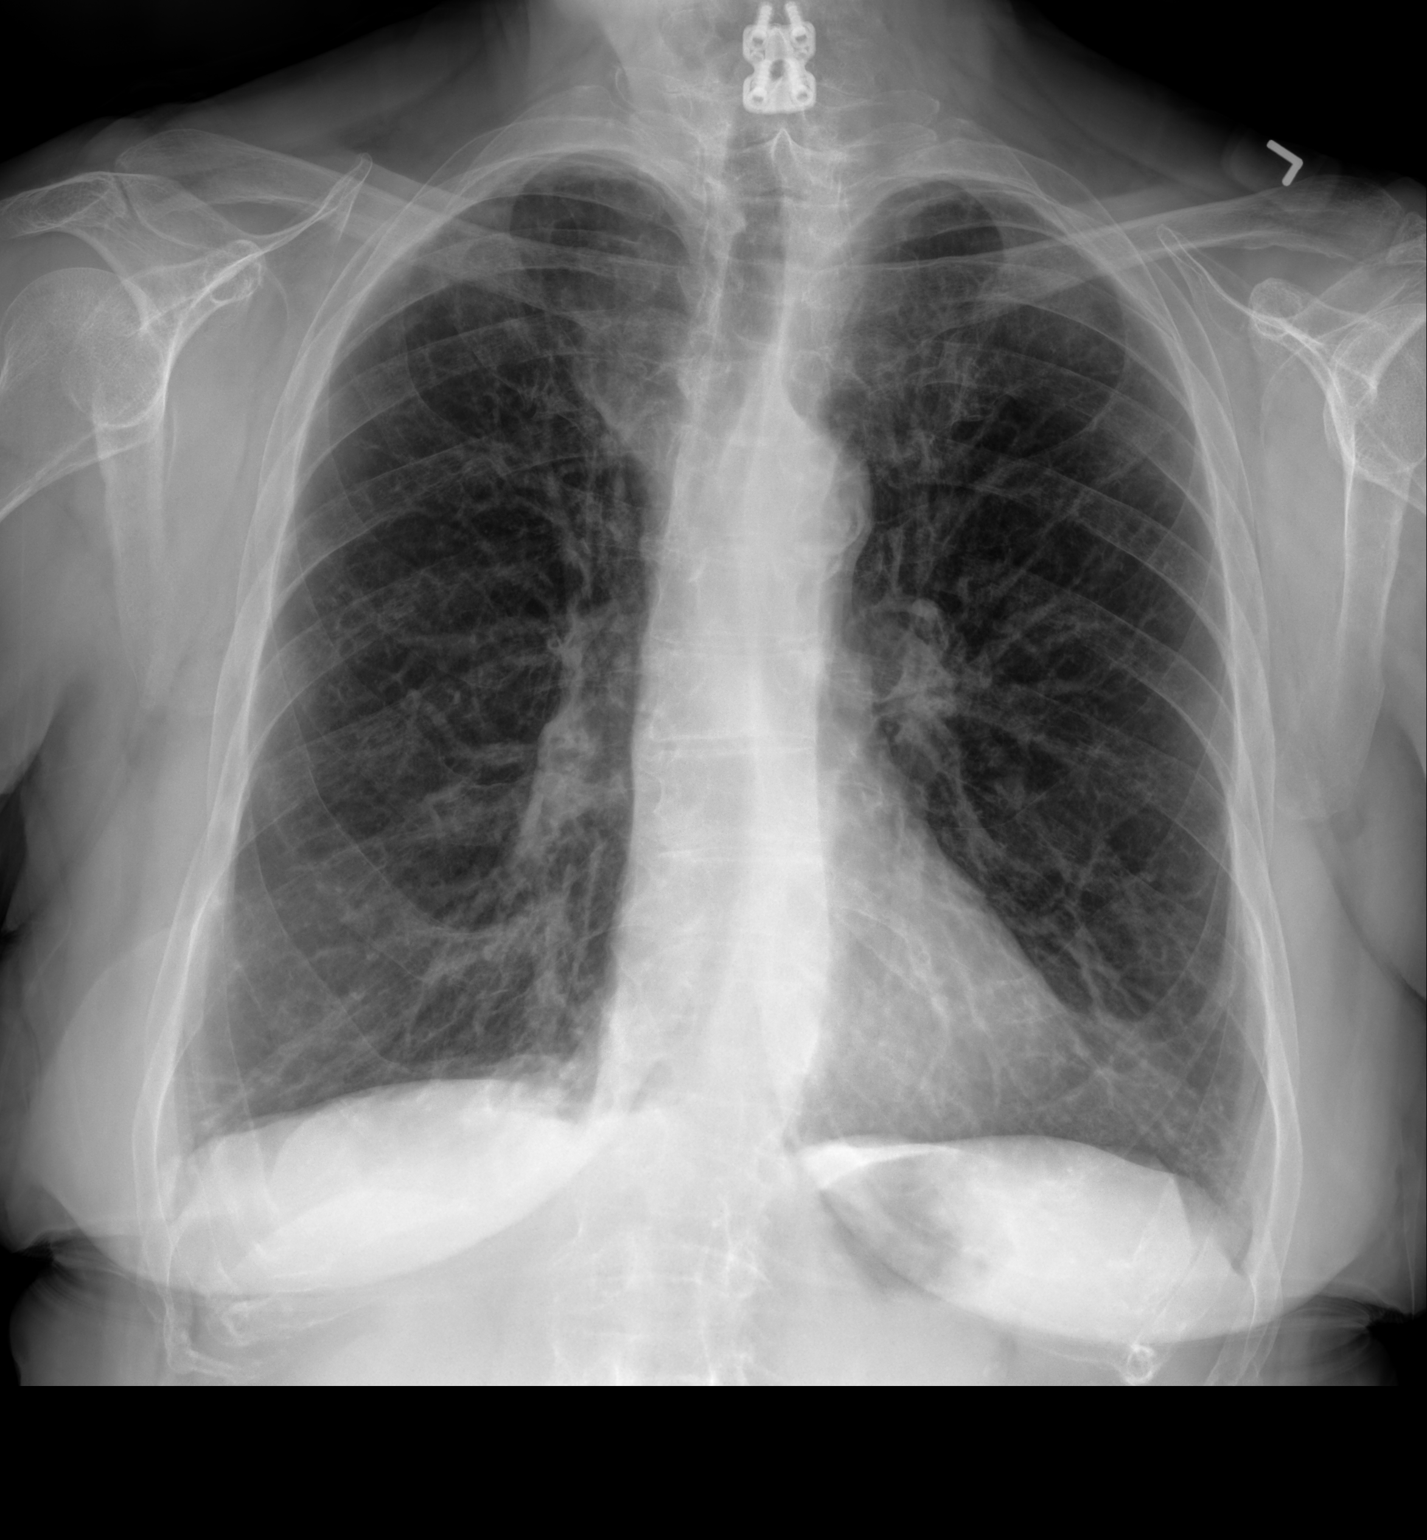

[chest lat]
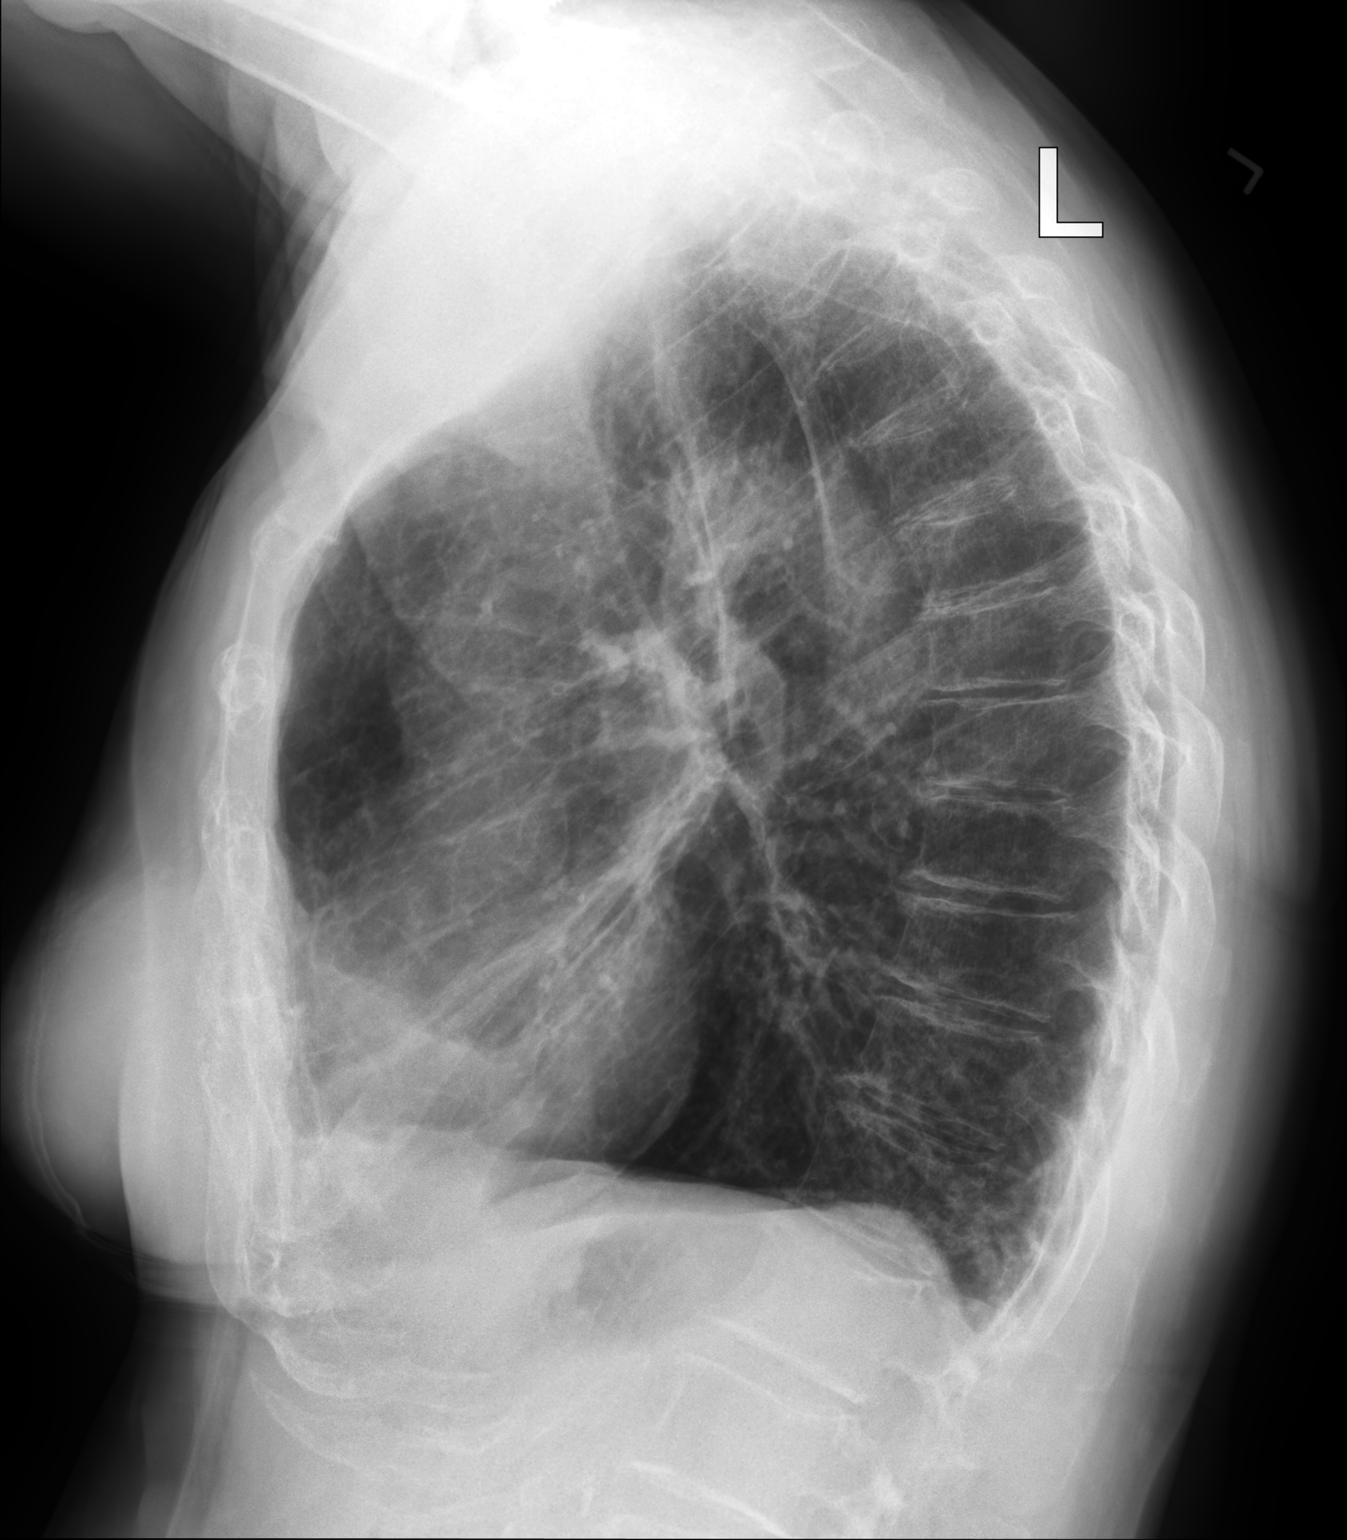

[2 of 2 positions shown; findings below may reference images not displayed]

FINDINGS: Coarse interstitial markings are again noted throughout the lungs
bilaterally, most evident throughout the mid to lower lungs, similar
to prior studies. Lung volumes are normal. No consolidative airspace
disease. No pleural effusions. No pneumothorax. No pulmonary nodule
or mass noted. Pulmonary vasculature and the cardiomediastinal
silhouette are within normal limits. Atherosclerosis in the thoracic
aorta. Orthopedic fixation hardware noted in the lower cervical
spine.
IMPRESSION: 1. No radiographic evidence of acute cardiopulmonary disease.
2. Chronically increased interstitial markings in the lungs
bilaterally, which could indicate underlying interstitial lung
disease. Further evaluation with nonemergent high-resolution chest
CT could be considered if clinically appropriate.
3. Aortic atherosclerosis.

## 2021-03-02 ENCOUNTER — Inpatient Hospital Stay (HOSPITAL_BASED_OUTPATIENT_CLINIC_OR_DEPARTMENT_OTHER): Payer: Medicare Other | Admitting: Hematology

## 2021-03-02 ENCOUNTER — Inpatient Hospital Stay (HOSPITAL_COMMUNITY): Payer: Medicare Other

## 2021-03-02 ENCOUNTER — Encounter (HOSPITAL_COMMUNITY): Payer: Self-pay | Admitting: Hematology

## 2021-03-02 ENCOUNTER — Other Ambulatory Visit: Payer: Self-pay

## 2021-03-02 VITALS — BP 114/76 | HR 88 | Temp 97.3°F | Resp 18 | Ht 61.0 in | Wt 137.8 lb

## 2021-03-02 VITALS — BP 126/61 | HR 63 | Temp 97.5°F | Resp 18

## 2021-03-02 DIAGNOSIS — Z806 Family history of leukemia: Secondary | ICD-10-CM | POA: Diagnosis not present

## 2021-03-02 DIAGNOSIS — Z79899 Other long term (current) drug therapy: Secondary | ICD-10-CM | POA: Diagnosis not present

## 2021-03-02 DIAGNOSIS — Z85828 Personal history of other malignant neoplasm of skin: Secondary | ICD-10-CM | POA: Diagnosis not present

## 2021-03-02 DIAGNOSIS — Z836 Family history of other diseases of the respiratory system: Secondary | ICD-10-CM | POA: Diagnosis not present

## 2021-03-02 DIAGNOSIS — Z833 Family history of diabetes mellitus: Secondary | ICD-10-CM | POA: Diagnosis not present

## 2021-03-02 DIAGNOSIS — C8338 Diffuse large B-cell lymphoma, lymph nodes of multiple sites: Secondary | ICD-10-CM | POA: Diagnosis not present

## 2021-03-02 DIAGNOSIS — I252 Old myocardial infarction: Secondary | ICD-10-CM | POA: Diagnosis not present

## 2021-03-02 DIAGNOSIS — Z8261 Family history of arthritis: Secondary | ICD-10-CM | POA: Diagnosis not present

## 2021-03-02 DIAGNOSIS — D649 Anemia, unspecified: Secondary | ICD-10-CM | POA: Diagnosis not present

## 2021-03-02 DIAGNOSIS — C8590 Non-Hodgkin lymphoma, unspecified, unspecified site: Secondary | ICD-10-CM | POA: Diagnosis not present

## 2021-03-02 DIAGNOSIS — I10 Essential (primary) hypertension: Secondary | ICD-10-CM | POA: Diagnosis not present

## 2021-03-02 DIAGNOSIS — Z818 Family history of other mental and behavioral disorders: Secondary | ICD-10-CM | POA: Diagnosis not present

## 2021-03-02 DIAGNOSIS — M2550 Pain in unspecified joint: Secondary | ICD-10-CM | POA: Diagnosis not present

## 2021-03-02 DIAGNOSIS — D801 Nonfamilial hypogammaglobulinemia: Secondary | ICD-10-CM | POA: Diagnosis not present

## 2021-03-02 DIAGNOSIS — Z8249 Family history of ischemic heart disease and other diseases of the circulatory system: Secondary | ICD-10-CM | POA: Diagnosis not present

## 2021-03-02 DIAGNOSIS — Z86718 Personal history of other venous thrombosis and embolism: Secondary | ICD-10-CM | POA: Diagnosis not present

## 2021-03-02 DIAGNOSIS — Z823 Family history of stroke: Secondary | ICD-10-CM | POA: Diagnosis not present

## 2021-03-02 MED ORDER — IMMUNE GLOBULIN (HUMAN) 10 GM/100ML IV SOLN
30.0000 g | Freq: Once | INTRAVENOUS | Status: AC
  Administered 2021-03-02: 30 g via INTRAVENOUS
  Filled 2021-03-02: qty 300

## 2021-03-02 MED ORDER — DIPHENHYDRAMINE HCL 25 MG PO CAPS: 25.0000 mg | ORAL_CAPSULE | Freq: Once | ORAL | Status: DC

## 2021-03-02 MED ORDER — DEXTROSE 5 % IV SOLN: INTRAVENOUS | Status: DC

## 2021-03-02 MED ORDER — ACETAMINOPHEN 325 MG PO TABS: 650.0000 mg | ORAL_TABLET | Freq: Four times a day (QID) | ORAL | Status: DC | PRN

## 2021-03-02 MED ORDER — SODIUM CHLORIDE 0.9 % IV SOLN
300.0000 mg | Freq: Once | INTRAVENOUS | Status: DC
  Filled 2021-03-02: qty 15

## 2021-03-02 NOTE — Patient Instructions (Addendum)
[place] at [facility] Discharge Instructions   You were seen and examined today by Dr. Delton Coombes.  He reviewed your lab work today which is normal/stable.   Dr. Raliegh Ip would like you to start on iron pills (325 mg) once a day.  You may use a stool softener with this as iron pills can cause constipation.  Return as scheduled for lab work, infusions, and office visit.    Thank you for choosing [place] at [facility] to provide your oncology and hematology care.  To afford each patient quality time with our provider, please arrive at least 15 minutes before your scheduled appointment time.   If you have a lab appointment with the [facility] please come in thru the Main Entrance and check in at the main information desk.  You need to re-schedule your appointment should you arrive 10 or more minutes late.  We strive to give you quality time with our providers, and arriving late affects you and other patients whose appointments are after yours.  Also, if you no show three or more times for appointments you may be dismissed from the clinic at the providers discretion.     Again, thank you for choosing [facility]  Our hope is that these requests will decrease the amount of time that you wait before being seen by our physicians.       _____________________________________________________________  Should you have questions after your visit to [facility], please contact our office at [phone] and follow the prompts.  Our office hours are 8:00 a.m. and 4:30 p.m. Monday - Friday.  Please note that voicemails left after 4:00 p.m. may not be returned until the following business day.  We are closed weekends and major holidays.  You do have access to a nurse 24-7, just call the main number to the clinic [phone] and do not press any options, hold on the line and a nurse will answer the phone.    For prescription refill  requests, have your pharmacy contact our office and allow 72 hours.    Due to Covid, you will need to wear a mask upon entering the hospital. If you do not have a mask, a mask will be given to you at the Main Entrance upon arrival. For doctor visits, patients may have 1 support person age 29 or older with them. For treatment visits, patients can not have anyone with them due to social distancing guidelines and our immunocompromised population.

## 2021-03-02 NOTE — Progress Notes (Signed)
Chaplain engaged in a follow-up visit with [name].  [name] shared about her love for the beach, collecting shells, finding buried items with a metal detector, and looking for shark teeth.  [name] talked about this tradition of the beach and searching for shells that originated from her father.  [name] finds a lot of joy from spending hours on the beach searching for items that she loves.   [name] plans on going to the beach by the end of the month if her health permits.  Chaplain offered listening, sharing, and presence as they both discussed their plans for vacation, relaxation, and replenishment.     03/02/21 1100  Clinical Encounter Type  Visited With Patient  Visit Type Follow-up;Spiritual support

## 2021-03-02 NOTE — Patient Instructions (Signed)
[facility]  Discharge Instructions: Thank you for choosing [facility] to provide your oncology and hematology care.  If you have a lab appointment with the [facility], please come in thru the Main Entrance and check in at the main information desk.  Wear comfortable clothing and clothing appropriate for easy access to any Portacath or PICC line.   We strive to give you quality time with your provider. You may need to reschedule your appointment if you arrive late (15 or more minutes).  Arriving late affects you and other patients whose appointments are after yours.  Also, if you miss three or more appointments without notifying the office, you may be dismissed from the clinic at the providers discretion.      For prescription refill requests, have your pharmacy contact our office and allow 72 hours for refills to be completed.    Today you received the following chemotherapy and/or immunotherapy agents IVIG      To help prevent nausea and vomiting after your treatment, we encourage you to take your nausea medication as directed.  BELOW ARE SYMPTOMS THAT SHOULD BE REPORTED IMMEDIATELY: *FEVER GREATER THAN 100.4 F (38 C) OR HIGHER *CHILLS OR SWEATING *NAUSEA AND VOMITING THAT IS NOT CONTROLLED WITH YOUR NAUSEA MEDICATION *UNUSUAL SHORTNESS OF BREATH *UNUSUAL BRUISING OR BLEEDING *URINARY PROBLEMS (pain or burning when urinating, or frequent urination) *BOWEL PROBLEMS (unusual diarrhea, constipation, pain near the anus) TENDERNESS IN MOUTH AND THROAT WITH OR WITHOUT PRESENCE OF ULCERS (sore throat, sores in mouth, or a toothache) UNUSUAL RASH, SWELLING OR PAIN  UNUSUAL VAGINAL DISCHARGE OR ITCHING   Items with * indicate a potential emergency and should be followed up as soon as possible or go to the Emergency Department if any problems should occur.  Please show the CHEMOTHERAPY ALERT CARD or IMMUNOTHERAPY ALERT CARD at check-in to the Emergency Department  and triage nurse.  Should you have questions after your visit or need to cancel or reschedule your appointment, please contact [facility] [phone]  and follow the prompts.  Office hours are 8:00 a.m. to 4:30 p.m. Monday - Friday. Please note that voicemails left after 4:00 p.m. may not be returned until the following business day.  We are closed weekends and major holidays. You have access to a nurse at all times for urgent questions. Please call the main number to the clinic [phone] and follow the prompts.  For any non-urgent questions, you may also contact your provider using MyChart. We now offer e-Visits for anyone 54 and older to request care online for non-urgent symptoms. For details visit [email or web address].   Also download the MyChart app! Go to the app store, search "MyChart", open the app, select [facility], and log in with your MyChart username and password.  Due to Covid, a mask is required upon entering the hospital/clinic. If you do not have a mask, one will be given to you upon arrival. For doctor visits, patients may have 1 support person aged 2 or older with them. For treatment visits, patients cannot have anyone with them due to current Covid guidelines and our immunocompromised population.

## 2021-03-02 NOTE — Progress Notes (Signed)
[name], [place] [address]   CLINIC:  Medical Oncology/Hematology  PCP:  Sharilyn Sites, MD [address] / [place] [address] [phone]   REASON FOR VISIT:  Follow-up for acquired hypogammaglobulinemia and DLBCL  PRIOR THERAPY: R-CHOP x 4 cycles with intrathecal methotrexate during cycles 2 & 4  CURRENT THERAPY: IVIG every 8 weeks; intermittent Feraheme last on 12/13/2019  BRIEF ONCOLOGIC HISTORY:  Oncology History  Non Hodgkin's lymphoma ([facility])  09/09/2009 Initial Diagnosis   Non Hodgkin's lymphoma ([facility])     CANCER STAGING: Cancer Staging  Non Hodgkin's lymphoma ([facility]) Staging form: Lymphoid Neoplasms, AJCC 6th Edition - Clinical: Stage IV - Signed by Baird Cancer, PA on 11/05/2010   INTERVAL HISTORY:  [name], a 66 y.o. female, returns for routine follow-up of her acquired hypogammaglobulinemia and DLBCL. Darrion was last seen on 11/03/2020.   Today she reports feeling good. She is not currently taking iron tablets. She denies recent infections, fevers, night sweats, itching, and weight loss.   REVIEW OF SYSTEMS:  Review of Systems  Constitutional:  Negative for appetite change, fatigue, fever and unexpected weight change.  Endocrine: Negative for hot flashes.  Musculoskeletal:  Positive for arthralgias (8/10 L arm).  Skin:  Negative for itching.  All other systems reviewed and are negative.  PAST MEDICAL/SURGICAL HISTORY:  Past Medical History:  Diagnosis Date   Allergic rhinitis    Anxiety    Asthma    B12 deficiency 01/18/2015   Cataract    Cavitary lung disease    Chronic sinusitis    COPD (chronic obstructive pulmonary disease) (HCC)    Depression    Diffuse large B-cell lymphoma (HCC)    DVT (deep venous thrombosis) ([facility]) 09/25/2014   Essential hypertension    GERD (gastroesophageal reflux disease)    Hematuria 04/07/2016   History of pneumonia 01/2013   Hyperlipidemia     Hypogammaglobulinemia, acquired ([facility])    Iron deficiency anemia 01/18/2015   Myocardial infarction ([facility]) 2011   Narcolepsy without cataplexy(347.00) 01/18/2015   Orofacial dyskinesia 04/22/2015   Osteoporosis    Peripheral neuropathy due to chemotherapy ([facility]) 05/02/2016   Sleep apnea    SVC syndrome 09/25/2014   Past Surgical History:  Procedure Laterality Date   ABDOMINAL HYSTERECTOMY     Fibroids   BASAL CELL CARCINOMA EXCISION  03/2011   scalp   BREAST SURGERY Right    biopsy   CATARACT EXTRACTION W/PHACO Left 11/17/2014   Procedure: CATARACT EXTRACTION PHACO AND INTRAOCULAR LENS PLACEMENT LEFT EYE;  Surgeon: Tonny Branch, MD;  Location: [facility];  Service: Ophthalmology;  Laterality: Left;  CDE:5.60   CATARACT EXTRACTION W/PHACO Right 12/15/2014   Procedure: CATARACT EXTRACTION PHACO AND INTRAOCULAR LENS PLACEMENT RIGHT EYE CDE=5.16;  Surgeon: Tonny Branch, MD;  Location: [facility];  Service: Ophthalmology;  Laterality: Right;   COLONOSCOPY N/A 11/14/2012   Procedure: COLONOSCOPY;  Surgeon: Rogene Houston, MD;  Location: [facility];  Service: Endoscopy;  Laterality: N/A;  830   COLONOSCOPY WITH PROPOFOL N/A 01/12/2018   Procedure: COLONOSCOPY WITH PROPOFOL;  Surgeon: Rogene Houston, MD;  Location: [facility];  Service: Endoscopy;  Laterality: N/A;  7:30   EYE SURGERY     NASAL SINUS SURGERY     NECK SURGERY     x 2    PERIPHERALLY INSERTED CENTRAL CATHETER INSERTION     PICC Removal     POLYPECTOMY  01/12/2018   Procedure: POLYPECTOMY;  Surgeon: Rogene Houston, MD;  Location: [facility];  Service: Endoscopy;;  colon   PORT-A-CATH REMOVAL     PORTACATH PLACEMENT  7/11   Removed 6/12   TRACHEOSTOMY     VESICOVAGINAL FISTULA CLOSURE W/ TAH      SOCIAL HISTORY:  Social History   Socioeconomic History   Marital status: Divorced    Spouse name: Not on file   Number of children: 3   Years of education: 12   Highest education level: Not on file  Occupational History    Occupation: teller    Comment: [employer]  Tobacco Use   Smoking status: Never   Smokeless tobacco: Never  Vaping Use   Vaping Use: Never used  Substance and Sexual Activity   Alcohol use: No   Drug use: No   Sexual activity: Not on file    Comment: hyst  Other Topics Concern   Not on file  Social History Narrative   Originally from [place]. Always lived in [place]. Prior travel to [place]. 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 She has 1 dog currently. She has 1 conures (small parrots). No mold exposure in her home. 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 No hot tub exposure. She enjoys sewing & quilting.    Social Determinants of Health   Financial Resource Strain: Not on file  Food Insecurity: Not on file  Transportation Needs: Not on file  Physical Activity: Not on file  Stress: Not on file  Social Connections: Not on file  Intimate Partner Violence: Not on file    FAMILY HISTORY:  Family History  Problem Relation Age of Onset   Emphysema Mother    Stroke Mother    COPD Mother    Heart disease Mother        died in sleep  26   Allergies Father    Asthma Father        as a child   Arthritis Father    Parkinson's disease Father 73   Leukemia Maternal Grandmother    Cancer Maternal Grandmother        Leukemia   Diabetes Brother    Heart attack Brother    Hypertension Brother    Heart disease Brother 47       stents   Hypertension Sister    Hypertension Brother     CURRENT MEDICATIONS:  Current Outpatient Medications  Medication Sig Dispense Refill   acetaminophen (TYLENOL) 500 MG tablet Take 1,000 mg by mouth every 6 (six) hours as needed for moderate pain or headache.      acyclovir (ZOVIRAX) 400 MG tablet Take 1 tablet (400 mg total) by mouth 2 (two) times daily. 60 tablet 6   albuterol (PROAIR HFA) 108 (90 Base) MCG/ACT inhaler Inhale 2 puffs into the lungs every 6 (six) hours as needed.  (Patient taking differently: Inhale 2 puffs into the lungs every 6 (six) hours as needed for wheezing or shortness of breath.) 8.5 g 2   albuterol (PROVENTIL) (2.5 MG/3ML) 0.083% nebulizer solution Take 2.5 mg by nebulization every 4 (four) hours as needed for wheezing or shortness of breath.     amphetamine-dextroamphetamine (ADDERALL) 10 MG tablet Take 10 mg by mouth every morning.   0   Armodafinil 150 MG tablet Take 150 mg by mouth every morning.      aspirin EC 81 MG tablet Take 81 mg  by mouth daily. Swallow whole.     budesonide-formoterol (SYMBICORT) 160-4.5 MCG/ACT inhaler Inhale 2 puffs into the lungs 2 (two) times daily. 1 Inhaler 5   celecoxib (CELEBREX) 200 MG capsule Take 200 mg by mouth 2 (two) times daily as needed.     Cholecalciferol (VITAMIN D3 PO) Take 1 capsule by mouth at bedtime.     citalopram (CELEXA) 20 MG tablet Take 20 mg by mouth every morning.     cyanocobalamin 1000 MCG tablet Take 1,000 mcg by mouth daily.     furosemide (LASIX) 20 MG tablet Take 1 tablet (20 mg total) by mouth as needed for fluid. 90 tablet 1   gabapentin (NEURONTIN) 600 MG tablet 300 mg 2 (two) times daily.      olmesartan (BENICAR) 40 MG tablet Take 20-40 mg by mouth daily.     omeprazole (PRILOSEC) 20 MG capsule TAKE ONE CAPSULE BY MOUTH TWICE DAILY BEFORE A MEAL. 180 capsule 0   predniSONE (DELTASONE) 5 MG tablet TAKE 1 TABLET BY MOUTH ONCE A DAY. 30 tablet 0   simvastatin (ZOCOR) 80 MG tablet Take 1 tablet (80 mg total) by mouth at bedtime. 90 tablet 3   zafirlukast (ACCOLATE) 20 MG tablet TAKE 1 TABLET BY MOUTH TWICE DAILY. 60 tablet 0   No current facility-administered medications for this visit.   Facility-Administered Medications Ordered in Other Visits  Medication Dose Route Frequency Provider Last Rate Last Admin   0.9 %  sodium chloride infusion   Intravenous Once Kefalas, Thomas S, PA-C       0.9 %  sodium chloride infusion   Intravenous Once Kefalas, Thomas S, PA-C        acetaminophen (TYLENOL) tablet 650 mg  650 mg Oral Q6H PRN Kefalas, Thomas S, PA-C       acetaminophen (TYLENOL) tablet 650 mg  650 mg Oral Q6H PRN Kefalas, Manon Hilding, PA-C       dextrose 5 % solution   Intravenous Continuous Kefalas, Manon Hilding, PA-C   Stopped at 08/25/15 1400   dextrose 5 % solution   Intravenous Continuous Kefalas, Manon Hilding, PA-C   Stopped at 07/13/16 1350   dextrose 5 % solution   Intravenous Continuous Holley Bouche, NP   Stopped at 02/28/17 1230   diphenhydrAMINE (BENADRYL) capsule 25 mg  25 mg Oral Once Kefalas, Thomas S, PA-C       diphenhydrAMINE (BENADRYL) capsule 25 mg  25 mg Oral Once Kefalas, Thomas S, PA-C       sodium chloride 0.9 % injection 10 mL  10 mL Intracatheter PRN Baird Cancer, PA-C   10 mL at 08/25/15 1125    ALLERGIES:  Allergies  Allergen Reactions   Meperidine Hcl Anaphylaxis   Montelukast Sodium Hives and Rash    PHYSICAL EXAM:  Performance status (ECOG): 1 - Symptomatic but completely ambulatory  There were no vitals filed for this visit. Wt Readings from Last 3 Encounters:  12/29/20 135 lb 6.4 oz (61.4 kg)  12/08/20 131 lb 9.6 oz (59.7 kg)  11/03/20 138 lb 9.6 oz (62.9 kg)   Physical Exam Vitals reviewed.  Constitutional:      Appearance: Normal appearance.  Cardiovascular:     Rate and Rhythm: Normal rate and regular rhythm.     Pulses: Normal pulses.     Heart sounds: Normal heart sounds.  Pulmonary:     Effort: Pulmonary effort is normal.     Breath sounds: Normal breath sounds.  Abdominal:  Palpations: Abdomen is soft. There is no hepatomegaly, splenomegaly or mass.     Tenderness: There is no abdominal tenderness.  Lymphadenopathy:     Upper Body:     Right upper body: No supraclavicular or axillary adenopathy.     Left upper body: No supraclavicular or axillary adenopathy.     Lower Body: No right inguinal adenopathy. No left inguinal adenopathy.  Neurological:     General: No focal deficit present.      Mental Status: She is alert and oriented to person, place, and time.  Psychiatric:        Mood and Affect: Mood normal.        Behavior: Behavior normal.     LABORATORY DATA:  I have reviewed the labs as listed.  CBC Latest Ref Rng & Units 02/22/2021 12/25/2020 11/02/2020  WBC 4.0 - 10.5 K/uL 7.7 8.6 5.4  Hemoglobin 12.0 - 15.0 g/dL 12.0 12.7 10.8(L)  Hematocrit 36.0 - 46.0 % 37.1 39.6 34.4(L)  Platelets 150 - 400 K/uL 180 190 166   CMP Latest Ref Rng & Units 02/22/2021 12/25/2020 11/02/2020  Glucose 70 - 99 mg/dL 94 86 110(H)  BUN 8 - 23 mg/dL 18 15 19   Creatinine 0.44 - 1.00 mg/dL 0.88 0.84 0.91  Sodium 135 - 145 mmol/L 140 138 140  Potassium 3.5 - 5.1 mmol/L 3.6 3.9 4.0  Chloride 98 - 111 mmol/L 102 101 105  CO2 22 - 32 mmol/L 30 28 28   Calcium 8.9 - 10.3 mg/dL 8.5(L) 8.6(L) 8.5(L)  Total Protein 6.5 - 8.1 g/dL 6.7 7.2 6.6  Total Bilirubin 0.3 - 1.2 mg/dL 0.5 0.3 0.6  Alkaline Phos 38 - 126 U/L 117 116 117  AST 15 - 41 U/L 14(L) 16 23  ALT 0 - 44 U/L 8 10 13     DIAGNOSTIC IMAGING:  I have independently reviewed the scans and discussed with the patient. MM 3D SCREEN BREAST BILATERAL  Result Date: 02/11/2021 CLINICAL DATA:  Screening. EXAM: DIGITAL SCREENING BILATERAL MAMMOGRAM WITH TOMOSYNTHESIS AND CAD TECHNIQUE: Bilateral screening digital craniocaudal and mediolateral oblique mammograms were obtained. Bilateral screening digital breast tomosynthesis was performed. The images were evaluated with computer-aided detection. COMPARISON:  Previous exam(s). ACR Breast Density Category b: There are scattered areas of fibroglandular density. FINDINGS: There are no findings suspicious for malignancy. IMPRESSION: No mammographic evidence of malignancy. A result letter of this screening mammogram will be mailed directly to the patient. RECOMMENDATION: Screening mammogram in one year. (Code:SM-B-01Y) BI-RADS CATEGORY  1: Negative. Electronically Signed   By: Margarette Canada M.D.   On: 02/11/2021  12:50     ASSESSMENT:  1. Acquired hypogammaglobulinemia: -She has been receiving IVIG every 8 weeks since January 2020. -She does have severely low IgA levels.  However he does not have any reactions.   2.  DLBCL, stage IVb: -She was diagnosed 07/08/2009.  Treated with 4 cycles of R-CHOP with intrathecal methotrexate during cycle 2 and 4.  She developed a Pseudomonas sepsis after cycles 2 and 4 and therapy was stopped after 4 cycles.  She also received 2 years of maintenance rituximab. -Last PET scan on 03/20/2017 did not show any evidence of lymphoma.   3.  Health maintenance: -Mammogram on 11/08/2018, BI-RADS Category 1.   PLAN:  1. Acquired hypogammaglobulinemia: - She had 1 episode of UTI requiring antibiotics in the last 4 months. - Denies any fevers, night sweats or weight loss. - Reviewed labs from 02/22/2021 which showed normal LFTs and renal function.  CBC was grossly normal.  Trough IgG was 1320. - Continue IVIG monthly. - RTC 4 months with repeat labs.   2.  DLBCL, stage IVb: - No palpable adenopathy or splenomegaly on exam.  No B symptoms.  No evidence of recurrence at this time.   3.  Normocytic anemia: - Hemoglobin is 12.0.  Ferritin is 64 and percent saturation 27. - Recommend starting iron tablet daily along with stool softener. - Plan to repeat iron panel in 4 months.  If there is no improvement, will consider parenteral iron therapy.   Orders placed this encounter:  No orders of the defined types were placed in this encounter.    Derek Jack, MD [facility] [phone]   I, Thana Ates, am acting as a scribe for Dr. Derek Jack.  I, Derek Jack MD, have reviewed the above documentation for accuracy and completeness, and I agree with the above.

## 2021-03-02 NOTE — Progress Notes (Signed)
Patient presents today for IVIG per providers order.  Vital signs WNL.  Pre-medications taken prior to visit per patient.  Patient has no new complaints at this time.    Peripheral IV started and blood return noted pre and post infusion.  IVIG given today per MD orders.  Stable during infusion without adverse affects.  Vital signs stable.  No complaints at this time.  Discharge from clinic ambulatory in stable condition.  Alert and oriented X 3.  Follow up with [facility] as scheduled.

## 2021-03-16 DIAGNOSIS — M4722 Other spondylosis with radiculopathy, cervical region: Secondary | ICD-10-CM | POA: Diagnosis not present

## 2021-03-16 DIAGNOSIS — Z6827 Body mass index (BMI) 27.0-27.9, adult: Secondary | ICD-10-CM | POA: Diagnosis not present

## 2021-03-16 DIAGNOSIS — M542 Cervicalgia: Secondary | ICD-10-CM | POA: Diagnosis not present

## 2021-03-17 ENCOUNTER — Other Ambulatory Visit: Payer: Self-pay | Admitting: Neurosurgery

## 2021-03-17 ENCOUNTER — Other Ambulatory Visit (HOSPITAL_COMMUNITY): Payer: Self-pay | Admitting: Neurosurgery

## 2021-03-17 DIAGNOSIS — M4722 Other spondylosis with radiculopathy, cervical region: Secondary | ICD-10-CM

## 2021-04-06 ENCOUNTER — Ambulatory Visit (HOSPITAL_COMMUNITY)
Admission: RE | Admit: 2021-04-06 | Discharge: 2021-04-06 | Disposition: A | Discharge status: Home / Self Care | Payer: Medicare Other | Source: Ambulatory Visit | Attending: Neurosurgery | Admitting: Neurosurgery

## 2021-04-06 DIAGNOSIS — M4722 Other spondylosis with radiculopathy, cervical region: Secondary | ICD-10-CM | POA: Insufficient documentation

## 2021-04-06 DIAGNOSIS — J441 Chronic obstructive pulmonary disease with (acute) exacerbation: Secondary | ICD-10-CM | POA: Diagnosis not present

## 2021-04-06 DIAGNOSIS — J189 Pneumonia, unspecified organism: Secondary | ICD-10-CM | POA: Diagnosis not present

## 2021-04-06 DIAGNOSIS — M542 Cervicalgia: Secondary | ICD-10-CM | POA: Diagnosis not present

## 2021-04-06 DIAGNOSIS — R200 Anesthesia of skin: Secondary | ICD-10-CM | POA: Diagnosis not present

## 2021-04-06 DIAGNOSIS — M1990 Unspecified osteoarthritis, unspecified site: Secondary | ICD-10-CM | POA: Diagnosis not present

## 2021-04-06 DIAGNOSIS — N183 Chronic kidney disease, stage 3 unspecified: Secondary | ICD-10-CM | POA: Diagnosis not present

## 2021-04-06 DIAGNOSIS — M2578 Osteophyte, vertebrae: Secondary | ICD-10-CM | POA: Diagnosis not present

## 2021-04-26 ENCOUNTER — Inpatient Hospital Stay (HOSPITAL_COMMUNITY): Payer: Medicare Other | Attending: Hematology

## 2021-04-26 DIAGNOSIS — D801 Nonfamilial hypogammaglobulinemia: Secondary | ICD-10-CM | POA: Insufficient documentation

## 2021-04-26 DIAGNOSIS — C8338 Diffuse large B-cell lymphoma, lymph nodes of multiple sites: Secondary | ICD-10-CM | POA: Diagnosis not present

## 2021-04-26 LAB — CBC WITH DIFFERENTIAL/PLATELET
Abs Immature Granulocytes: 0.01 10*3/uL (ref 0.00–0.07)
Basophils Absolute: 0 10*3/uL (ref 0.0–0.1)
Basophils Relative: 1 %
Eosinophils Absolute: 0.2 10*3/uL (ref 0.0–0.5)
Eosinophils Relative: 3 %
HCT: 36.8 % (ref 36.0–46.0)
Hemoglobin: 11.8 g/dL — ABNORMAL LOW (ref 12.0–15.0)
Immature Granulocytes: 0 %
Lymphocytes Relative: 29 %
Lymphs Abs: 1.4 10*3/uL (ref 0.7–4.0)
MCH: 30.6 pg (ref 26.0–34.0)
MCHC: 32.1 g/dL (ref 30.0–36.0)
MCV: 95.3 fL (ref 80.0–100.0)
Monocytes Absolute: 0.4 10*3/uL (ref 0.1–1.0)
Monocytes Relative: 9 %
Neutro Abs: 2.8 10*3/uL (ref 1.7–7.7)
Neutrophils Relative %: 58 %
Platelets: 176 10*3/uL (ref 150–400)
RBC: 3.86 MIL/uL — ABNORMAL LOW (ref 3.87–5.11)
RDW: 12.7 % (ref 11.5–15.5)
WBC: 4.9 10*3/uL (ref 4.0–10.5)
nRBC: 0 % (ref 0.0–0.2)

## 2021-04-26 LAB — COMPREHENSIVE METABOLIC PANEL
ALT: 9 U/L (ref 0–44)
AST: 16 U/L (ref 15–41)
Albumin: 3.4 g/dL — ABNORMAL LOW (ref 3.5–5.0)
Alkaline Phosphatase: 107 U/L (ref 38–126)
Anion gap: 6 (ref 5–15)
BUN: 17 mg/dL (ref 8–23)
CO2: 28 mmol/L (ref 22–32)
Calcium: 8.5 mg/dL — ABNORMAL LOW (ref 8.9–10.3)
Chloride: 104 mmol/L (ref 98–111)
Creatinine, Ser: 1.06 mg/dL — ABNORMAL HIGH (ref 0.44–1.00)
GFR, Estimated: 58 mL/min — ABNORMAL LOW (ref >60)
Glucose, Bld: 106 mg/dL — ABNORMAL HIGH (ref 70–99)
Potassium: 3.8 mmol/L (ref 3.5–5.1)
Sodium: 138 mmol/L (ref 135–145)
Total Bilirubin: 0.3 mg/dL (ref 0.3–1.2)
Total Protein: 6.8 g/dL (ref 6.5–8.1)

## 2021-04-26 LAB — LACTATE DEHYDROGENASE: LDH: 151 U/L (ref 98–192)

## 2021-04-27 ENCOUNTER — Inpatient Hospital Stay (HOSPITAL_COMMUNITY): Payer: Medicare Other

## 2021-04-27 VITALS — BP 133/88 | HR 72 | Temp 97.0°F | Resp 16 | Ht 60.0 in | Wt 132.0 lb

## 2021-04-27 DIAGNOSIS — D801 Nonfamilial hypogammaglobulinemia: Secondary | ICD-10-CM | POA: Diagnosis not present

## 2021-04-27 DIAGNOSIS — C8338 Diffuse large B-cell lymphoma, lymph nodes of multiple sites: Secondary | ICD-10-CM | POA: Diagnosis not present

## 2021-04-27 LAB — IGG, IGA, IGM
IgA: <5 mg/dL — ABNORMAL LOW (ref 87–352)
IgG (Immunoglobin G), Serum: 1186 mg/dL (ref 586–1602)
IgM (Immunoglobulin M), Srm: 89 mg/dL (ref 26–217)

## 2021-04-27 MED ORDER — IMMUNE GLOBULIN (HUMAN) 10 GM/100ML IV SOLN
30.0000 g | Freq: Once | INTRAVENOUS | Status: AC
  Administered 2021-04-27: 30 g via INTRAVENOUS
  Filled 2021-04-27: qty 300

## 2021-04-27 MED ORDER — DEXTROSE 5 % IV SOLN: INTRAVENOUS | Status: DC

## 2021-04-27 NOTE — Patient Instructions (Signed)
[facility]  Discharge Instructions: ?Thank you for choosing [facility] to provide your oncology and hematology care.  ?If you have a lab appointment with the Cancer Center, please come in thru the Main Entrance and check in at the main information desk. ? ? ? ?We strive to give you quality time with your provider. You may need to reschedule your appointment if you arrive late (15 or more minutes).  Arriving late affects you and other patients whose appointments are after yours.  Also, if you miss three or more appointments without notifying the office, you may be dismissed from the clinic at the provider?s discretion.    ?  ?For prescription refill requests, have your pharmacy contact our office and allow 72 hours for refills to be completed.   ? ?  ?To help prevent nausea and vomiting after your treatment, we encourage you to take your nausea medication as directed. ? ?BELOW ARE SYMPTOMS THAT SHOULD BE REPORTED IMMEDIATELY: ?*FEVER GREATER THAN 100.4 F (38 ?C) OR HIGHER ?*CHILLS OR SWEATING ?*NAUSEA AND VOMITING THAT IS NOT CONTROLLED WITH YOUR NAUSEA MEDICATION ?*UNUSUAL SHORTNESS OF BREATH ?*UNUSUAL BRUISING OR BLEEDING ?*URINARY PROBLEMS (pain or burning when urinating, or frequent urination) ?*BOWEL PROBLEMS (unusual diarrhea, constipation, pain near the anus) ?TENDERNESS IN MOUTH AND THROAT WITH OR WITHOUT PRESENCE OF ULCERS (sore throat, sores in mouth, or a toothache) ?UNUSUAL RASH, SWELLING OR PAIN  ?UNUSUAL VAGINAL DISCHARGE OR ITCHING  ? ?Items with * indicate a potential emergency and should be followed up as soon as possible or go to the Emergency Department if any problems should occur. ? ?Please show the CHEMOTHERAPY ALERT CARD or IMMUNOTHERAPY ALERT CARD at check-in to the Emergency Department and triage nurse. ? ?Should you have questions after your visit or need to cancel or reschedule your appointment, please contact [facility] [phone]  and follow  the prompts.  Office hours are 8:00 a.m. to 4:30 p.m. Monday - Friday. Please note that voicemails left after 4:00 p.m. may not be returned until the following business day.  We are closed weekends and major holidays. You have access to a nurse at all times for urgent questions. Please call the main number to the clinic [phone] and follow the prompts. ? ?For any non-urgent questions, you may also contact your provider using MyChart. We now offer e-Visits for anyone 18 and older to request care online for non-urgent symptoms. For details visit [email or web address]. ?  ?Also download the MyChart app! Go to the app store, search "MyChart", open the app, select [facility], and log in with your MyChart username and password. ? ?Due to Covid, a mask is required upon entering the hospital/clinic. If you do not have a mask, one will be given to you upon arrival. For doctor visits, patients may have 1 support person aged 18 or older with them. For treatment visits, patients cannot have anyone with them due to current Covid guidelines and our immunocompromised population.  ?

## 2021-04-27 NOTE — Progress Notes (Signed)
Treatment given per orders. Patient tolerated it well without problems. Vitals stable and discharged home from clinic ambulatory. Follow up as scheduled.  

## 2021-05-06 DIAGNOSIS — F909 Attention-deficit hyperactivity disorder, unspecified type: Secondary | ICD-10-CM | POA: Diagnosis not present

## 2021-05-06 DIAGNOSIS — J441 Chronic obstructive pulmonary disease with (acute) exacerbation: Secondary | ICD-10-CM | POA: Diagnosis not present

## 2021-05-06 DIAGNOSIS — Z6823 Body mass index (BMI) 23.0-23.9, adult: Secondary | ICD-10-CM | POA: Diagnosis not present

## 2021-05-10 DIAGNOSIS — M4722 Other spondylosis with radiculopathy, cervical region: Secondary | ICD-10-CM | POA: Diagnosis not present

## 2021-05-10 DIAGNOSIS — M4312 Spondylolisthesis, cervical region: Secondary | ICD-10-CM | POA: Diagnosis not present

## 2021-05-10 DIAGNOSIS — M542 Cervicalgia: Secondary | ICD-10-CM | POA: Diagnosis not present

## 2021-05-10 DIAGNOSIS — Z6825 Body mass index (BMI) 25.0-25.9, adult: Secondary | ICD-10-CM | POA: Diagnosis not present

## 2021-05-26 DIAGNOSIS — M4004 Postural kyphosis, thoracic region: Secondary | ICD-10-CM | POA: Diagnosis not present

## 2021-05-26 DIAGNOSIS — M9901 Segmental and somatic dysfunction of cervical region: Secondary | ICD-10-CM | POA: Diagnosis not present

## 2021-05-26 DIAGNOSIS — M47812 Spondylosis without myelopathy or radiculopathy, cervical region: Secondary | ICD-10-CM | POA: Diagnosis not present

## 2021-05-26 DIAGNOSIS — M9902 Segmental and somatic dysfunction of thoracic region: Secondary | ICD-10-CM | POA: Diagnosis not present

## 2021-05-28 DIAGNOSIS — M9902 Segmental and somatic dysfunction of thoracic region: Secondary | ICD-10-CM | POA: Diagnosis not present

## 2021-05-28 DIAGNOSIS — M4004 Postural kyphosis, thoracic region: Secondary | ICD-10-CM | POA: Diagnosis not present

## 2021-05-28 DIAGNOSIS — M47812 Spondylosis without myelopathy or radiculopathy, cervical region: Secondary | ICD-10-CM | POA: Diagnosis not present

## 2021-05-28 DIAGNOSIS — M9901 Segmental and somatic dysfunction of cervical region: Secondary | ICD-10-CM | POA: Diagnosis not present

## 2021-06-07 DIAGNOSIS — M9902 Segmental and somatic dysfunction of thoracic region: Secondary | ICD-10-CM | POA: Diagnosis not present

## 2021-06-07 DIAGNOSIS — M9901 Segmental and somatic dysfunction of cervical region: Secondary | ICD-10-CM | POA: Diagnosis not present

## 2021-06-07 DIAGNOSIS — M4004 Postural kyphosis, thoracic region: Secondary | ICD-10-CM | POA: Diagnosis not present

## 2021-06-07 DIAGNOSIS — M47812 Spondylosis without myelopathy or radiculopathy, cervical region: Secondary | ICD-10-CM | POA: Diagnosis not present

## 2021-06-11 DIAGNOSIS — M9902 Segmental and somatic dysfunction of thoracic region: Secondary | ICD-10-CM | POA: Diagnosis not present

## 2021-06-11 DIAGNOSIS — M47812 Spondylosis without myelopathy or radiculopathy, cervical region: Secondary | ICD-10-CM | POA: Diagnosis not present

## 2021-06-11 DIAGNOSIS — M9901 Segmental and somatic dysfunction of cervical region: Secondary | ICD-10-CM | POA: Diagnosis not present

## 2021-06-11 DIAGNOSIS — M4004 Postural kyphosis, thoracic region: Secondary | ICD-10-CM | POA: Diagnosis not present

## 2021-06-16 DIAGNOSIS — M9901 Segmental and somatic dysfunction of cervical region: Secondary | ICD-10-CM | POA: Diagnosis not present

## 2021-06-16 DIAGNOSIS — M47812 Spondylosis without myelopathy or radiculopathy, cervical region: Secondary | ICD-10-CM | POA: Diagnosis not present

## 2021-06-16 DIAGNOSIS — M4004 Postural kyphosis, thoracic region: Secondary | ICD-10-CM | POA: Diagnosis not present

## 2021-06-16 DIAGNOSIS — M9902 Segmental and somatic dysfunction of thoracic region: Secondary | ICD-10-CM | POA: Diagnosis not present

## 2021-06-21 ENCOUNTER — Other Ambulatory Visit (HOSPITAL_COMMUNITY): Payer: Medicare Other

## 2021-06-22 ENCOUNTER — Ambulatory Visit (HOSPITAL_COMMUNITY): Payer: Medicare Other

## 2021-06-22 ENCOUNTER — Ambulatory Visit (HOSPITAL_COMMUNITY): Payer: Medicare Other | Admitting: Hematology

## 2021-06-23 DIAGNOSIS — F909 Attention-deficit hyperactivity disorder, unspecified type: Secondary | ICD-10-CM | POA: Diagnosis not present

## 2021-06-23 DIAGNOSIS — G47419 Narcolepsy without cataplexy: Secondary | ICD-10-CM | POA: Diagnosis not present

## 2021-06-23 DIAGNOSIS — Z6823 Body mass index (BMI) 23.0-23.9, adult: Secondary | ICD-10-CM | POA: Diagnosis not present

## 2021-06-23 DIAGNOSIS — M1990 Unspecified osteoarthritis, unspecified site: Secondary | ICD-10-CM | POA: Diagnosis not present

## 2021-06-24 DIAGNOSIS — M9902 Segmental and somatic dysfunction of thoracic region: Secondary | ICD-10-CM | POA: Diagnosis not present

## 2021-06-24 DIAGNOSIS — M4004 Postural kyphosis, thoracic region: Secondary | ICD-10-CM | POA: Diagnosis not present

## 2021-06-24 DIAGNOSIS — M47812 Spondylosis without myelopathy or radiculopathy, cervical region: Secondary | ICD-10-CM | POA: Diagnosis not present

## 2021-06-24 DIAGNOSIS — M9901 Segmental and somatic dysfunction of cervical region: Secondary | ICD-10-CM | POA: Diagnosis not present

## 2021-06-28 ENCOUNTER — Inpatient Hospital Stay (HOSPITAL_COMMUNITY): Payer: Medicare Other | Attending: Hematology

## 2021-06-28 ENCOUNTER — Other Ambulatory Visit (HOSPITAL_COMMUNITY): Payer: Self-pay

## 2021-06-28 DIAGNOSIS — C8330 Diffuse large B-cell lymphoma, unspecified site: Secondary | ICD-10-CM | POA: Insufficient documentation

## 2021-06-28 DIAGNOSIS — C8338 Diffuse large B-cell lymphoma, lymph nodes of multiple sites: Secondary | ICD-10-CM

## 2021-06-28 DIAGNOSIS — D801 Nonfamilial hypogammaglobulinemia: Secondary | ICD-10-CM

## 2021-06-28 DIAGNOSIS — D649 Anemia, unspecified: Secondary | ICD-10-CM | POA: Insufficient documentation

## 2021-06-28 LAB — COMPREHENSIVE METABOLIC PANEL
ALT: 10 U/L (ref 0–44)
AST: 15 U/L (ref 15–41)
Albumin: 3.5 g/dL (ref 3.5–5.0)
Alkaline Phosphatase: 110 U/L (ref 38–126)
Anion gap: 7 (ref 5–15)
BUN: 25 mg/dL — ABNORMAL HIGH (ref 8–23)
CO2: 27 mmol/L (ref 22–32)
Calcium: 8.5 mg/dL — ABNORMAL LOW (ref 8.9–10.3)
Chloride: 104 mmol/L (ref 98–111)
Creatinine, Ser: 1.06 mg/dL — ABNORMAL HIGH (ref 0.44–1.00)
GFR, Estimated: 58 mL/min — ABNORMAL LOW (ref >60)
Glucose, Bld: 122 mg/dL — ABNORMAL HIGH (ref 70–99)
Potassium: 4.1 mmol/L (ref 3.5–5.1)
Sodium: 138 mmol/L (ref 135–145)
Total Bilirubin: 0.6 mg/dL (ref 0.3–1.2)
Total Protein: 7.2 g/dL (ref 6.5–8.1)

## 2021-06-28 LAB — CBC WITH DIFFERENTIAL/PLATELET
Abs Immature Granulocytes: 0.04 10*3/uL (ref 0.00–0.07)
Basophils Absolute: 0 10*3/uL (ref 0.0–0.1)
Basophils Relative: 0 %
Eosinophils Absolute: 0 10*3/uL (ref 0.0–0.5)
Eosinophils Relative: 0 %
HCT: 37 % (ref 36.0–46.0)
Hemoglobin: 11.9 g/dL — ABNORMAL LOW (ref 12.0–15.0)
Immature Granulocytes: 0 %
Lymphocytes Relative: 10 %
Lymphs Abs: 1 10*3/uL (ref 0.7–4.0)
MCH: 31 pg (ref 26.0–34.0)
MCHC: 32.2 g/dL (ref 30.0–36.0)
MCV: 96.4 fL (ref 80.0–100.0)
Monocytes Absolute: 0.2 10*3/uL (ref 0.1–1.0)
Monocytes Relative: 2 %
Neutro Abs: 8.6 10*3/uL — ABNORMAL HIGH (ref 1.7–7.7)
Neutrophils Relative %: 88 %
Platelets: 213 10*3/uL (ref 150–400)
RBC: 3.84 MIL/uL — ABNORMAL LOW (ref 3.87–5.11)
RDW: 13.3 % (ref 11.5–15.5)
WBC: 9.9 10*3/uL (ref 4.0–10.5)
nRBC: 0 % (ref 0.0–0.2)

## 2021-06-28 LAB — MAGNESIUM: Magnesium: 1.9 mg/dL (ref 1.7–2.4)

## 2021-06-29 ENCOUNTER — Inpatient Hospital Stay (HOSPITAL_COMMUNITY): Payer: Medicare Other

## 2021-06-29 ENCOUNTER — Inpatient Hospital Stay (HOSPITAL_BASED_OUTPATIENT_CLINIC_OR_DEPARTMENT_OTHER): Payer: Medicare Other | Admitting: Hematology

## 2021-06-29 VITALS — BP 136/67 | HR 72 | Temp 97.3°F | Resp 18

## 2021-06-29 VITALS — BP 150/82 | HR 99 | Temp 98.2°F | Resp 20 | Ht 60.83 in | Wt 129.2 lb

## 2021-06-29 DIAGNOSIS — D649 Anemia, unspecified: Secondary | ICD-10-CM | POA: Diagnosis not present

## 2021-06-29 DIAGNOSIS — D801 Nonfamilial hypogammaglobulinemia: Secondary | ICD-10-CM

## 2021-06-29 DIAGNOSIS — C8338 Diffuse large B-cell lymphoma, lymph nodes of multiple sites: Secondary | ICD-10-CM

## 2021-06-29 DIAGNOSIS — C8330 Diffuse large B-cell lymphoma, unspecified site: Secondary | ICD-10-CM | POA: Diagnosis not present

## 2021-06-29 LAB — IRON AND TIBC
Iron: 91 ug/dL (ref 28–170)
Saturation Ratios: 42 % — ABNORMAL HIGH (ref 10.4–31.8)
TIBC: 218 ug/dL — ABNORMAL LOW (ref 250–450)
UIBC: 127 ug/dL

## 2021-06-29 LAB — FERRITIN: Ferritin: 87 ng/mL (ref 11–307)

## 2021-06-29 MED ORDER — IMMUNE GLOBULIN (HUMAN) 10 GM/100ML IV SOLN
30.0000 g | Freq: Once | INTRAVENOUS | Status: AC
  Administered 2021-06-29: 30 g via INTRAVENOUS
  Filled 2021-06-29: qty 300

## 2021-06-29 MED ORDER — DEXTROSE 5 % IV SOLN: INTRAVENOUS | Status: DC

## 2021-07-01 ENCOUNTER — Other Ambulatory Visit (HOSPITAL_COMMUNITY): Payer: Self-pay

## 2021-07-01 DIAGNOSIS — D801 Nonfamilial hypogammaglobulinemia: Secondary | ICD-10-CM

## 2021-07-01 MED ORDER — ACYCLOVIR 400 MG PO TABS: 400.0000 mg | ORAL_TABLET | Freq: Two times a day (BID) | ORAL | 6 refills | Status: DC

## 2021-07-08 DIAGNOSIS — M4004 Postural kyphosis, thoracic region: Secondary | ICD-10-CM | POA: Diagnosis not present

## 2021-07-08 DIAGNOSIS — M47812 Spondylosis without myelopathy or radiculopathy, cervical region: Secondary | ICD-10-CM | POA: Diagnosis not present

## 2021-07-08 DIAGNOSIS — M9902 Segmental and somatic dysfunction of thoracic region: Secondary | ICD-10-CM | POA: Diagnosis not present

## 2021-07-08 DIAGNOSIS — M9901 Segmental and somatic dysfunction of cervical region: Secondary | ICD-10-CM | POA: Diagnosis not present

## 2021-07-19 DIAGNOSIS — M47812 Spondylosis without myelopathy or radiculopathy, cervical region: Secondary | ICD-10-CM | POA: Diagnosis not present

## 2021-07-19 DIAGNOSIS — M9902 Segmental and somatic dysfunction of thoracic region: Secondary | ICD-10-CM | POA: Diagnosis not present

## 2021-07-19 DIAGNOSIS — M4004 Postural kyphosis, thoracic region: Secondary | ICD-10-CM | POA: Diagnosis not present

## 2021-07-19 DIAGNOSIS — M9901 Segmental and somatic dysfunction of cervical region: Secondary | ICD-10-CM | POA: Diagnosis not present

## 2021-08-02 DIAGNOSIS — N183 Chronic kidney disease, stage 3 unspecified: Secondary | ICD-10-CM | POA: Diagnosis not present

## 2021-08-02 DIAGNOSIS — K219 Gastro-esophageal reflux disease without esophagitis: Secondary | ICD-10-CM | POA: Diagnosis not present

## 2021-08-02 DIAGNOSIS — I10 Essential (primary) hypertension: Secondary | ICD-10-CM | POA: Diagnosis not present

## 2021-08-03 IMAGING — MG DIGITAL SCREENING BILAT W/ TOMO W/ CAD
8 series · 8 of 24 positions shown · non-contrast
Comparison: Previous exam(s).

CLINICAL DATA: Screening.

EXAM:
DIGITAL SCREENING BILATERAL MAMMOGRAM WITH TOMO AND CAD

[L MLO synth-2D]
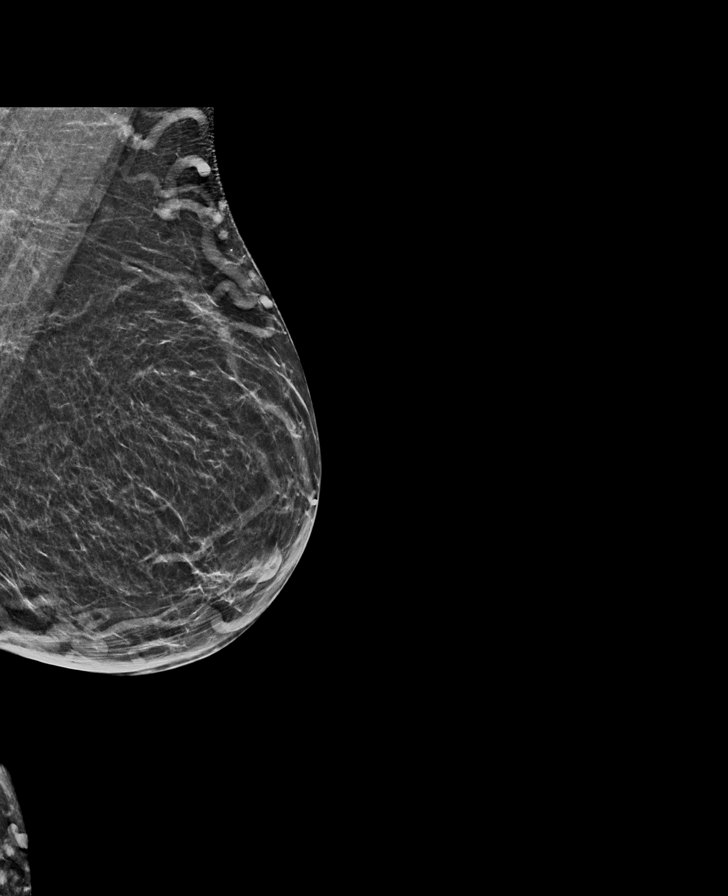

[R MLO synth-2D]
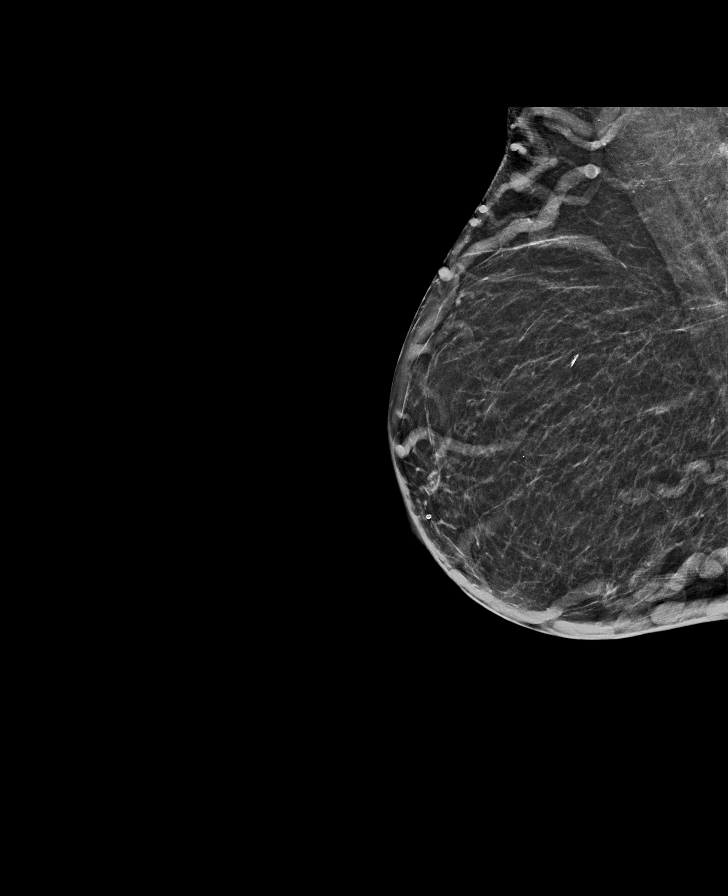

[L CC synth-2D]
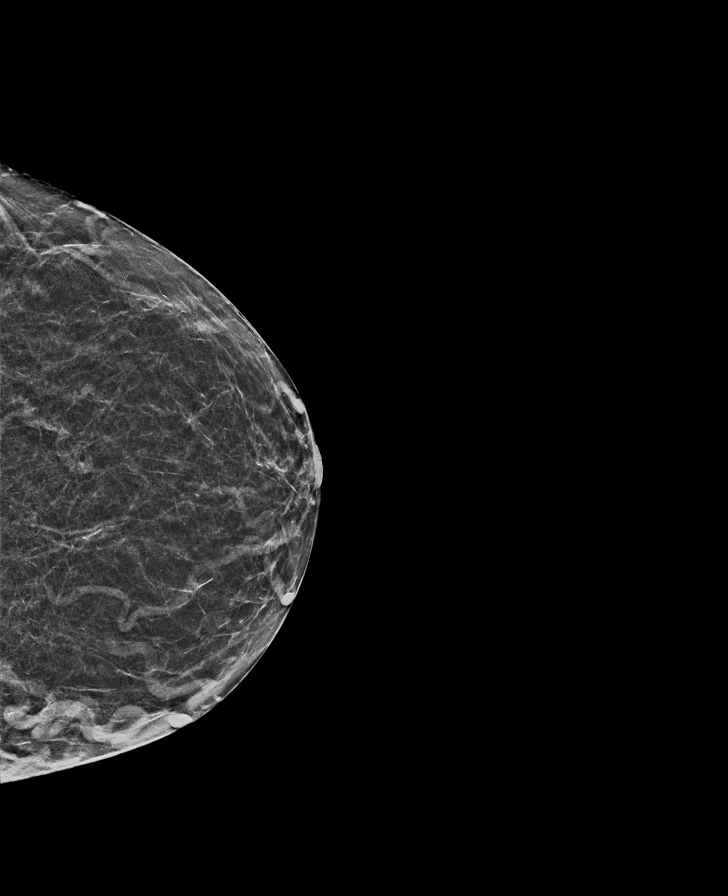

[R CC synth-2D]
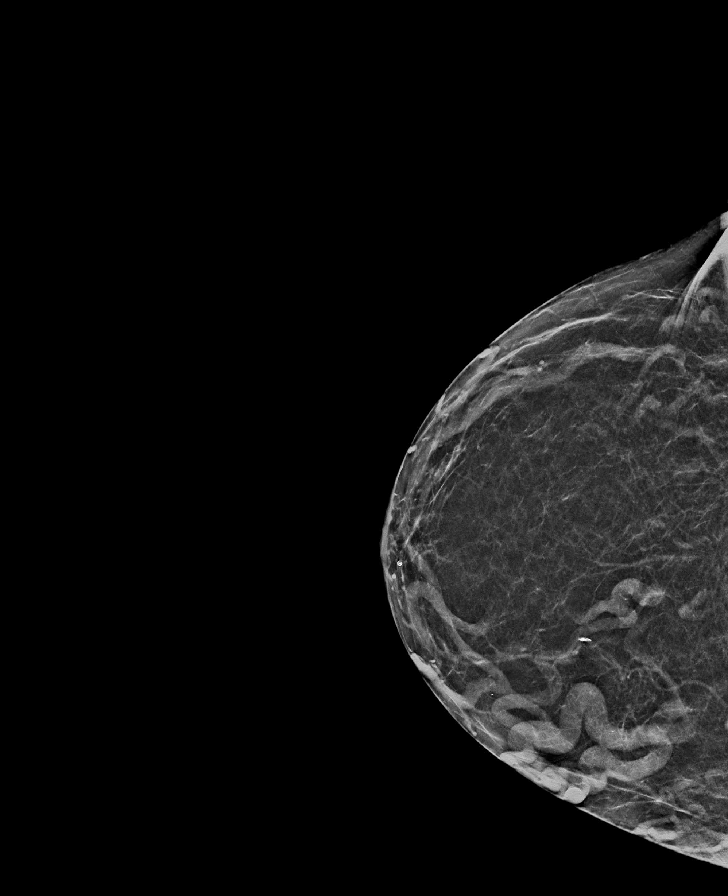

[L MLO tomo · tomo slice 27/53.0]
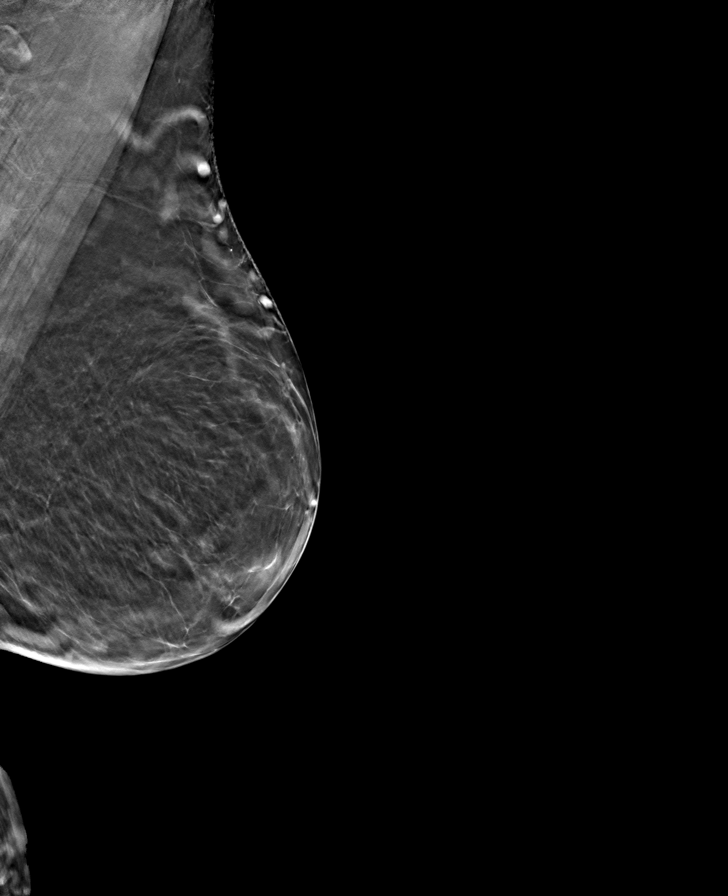

[R MLO tomo · tomo slice 27/53.0]
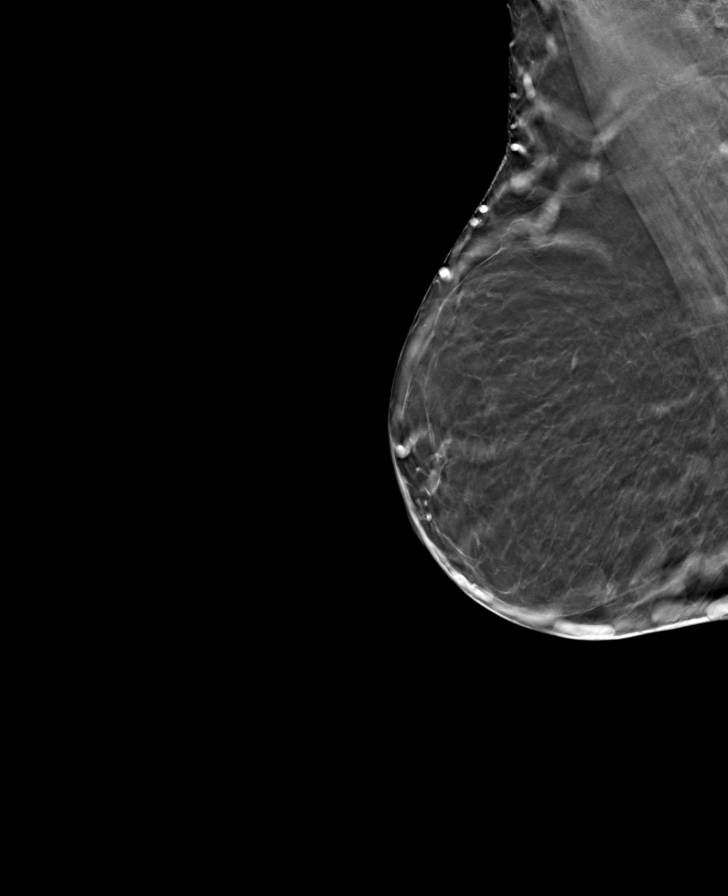

[L CC tomo · tomo slice 24/47.0]
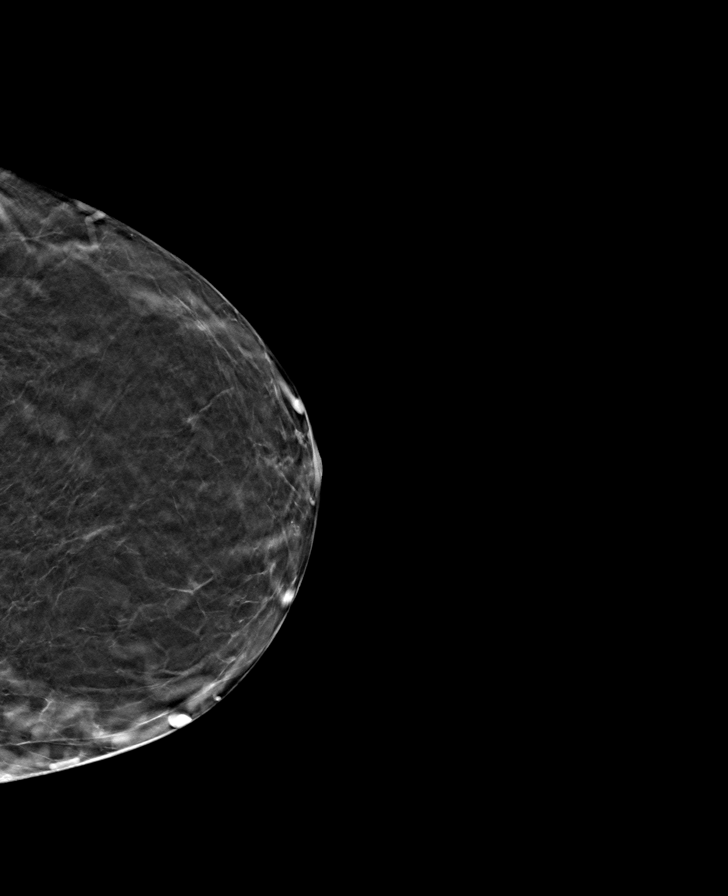

[R CC tomo · tomo slice 25/48.0]
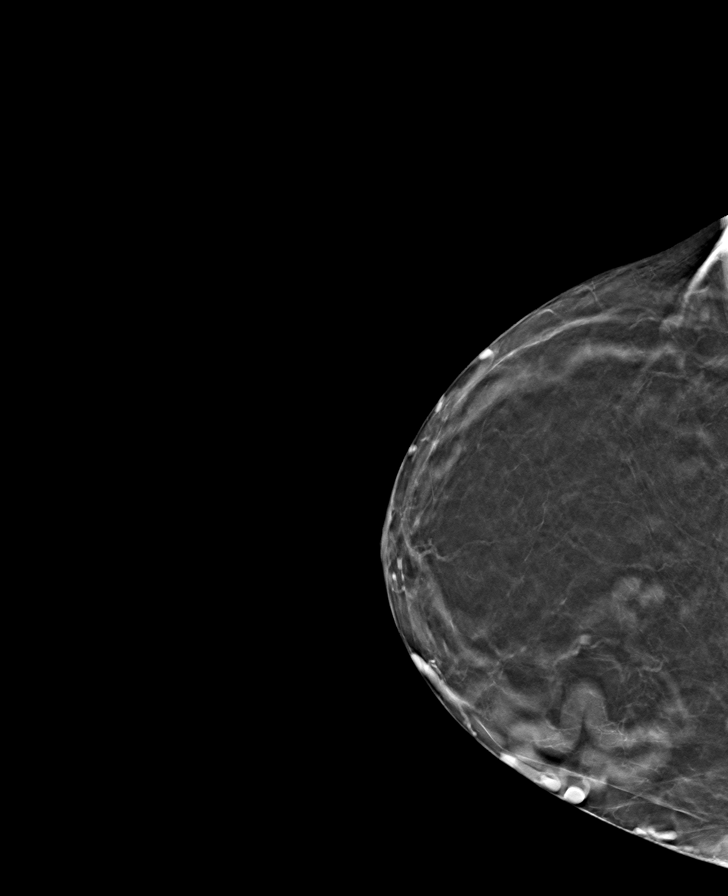

[8 of 24 positions shown; findings below may reference images not displayed]

ACR Breast Density Category b: There are scattered areas of
fibroglandular density.
FINDINGS: There are no findings suspicious for malignancy. Images were
processed with CAD.
IMPRESSION: No mammographic evidence of malignancy. A result letter of this
screening mammogram will be mailed directly to the patient.

RECOMMENDATION:
Screening mammogram in one year. (Code:[id number])

BI-RADS CATEGORY  1: Negative.

## 2021-08-09 DIAGNOSIS — M9901 Segmental and somatic dysfunction of cervical region: Secondary | ICD-10-CM | POA: Diagnosis not present

## 2021-08-09 DIAGNOSIS — M47812 Spondylosis without myelopathy or radiculopathy, cervical region: Secondary | ICD-10-CM | POA: Diagnosis not present

## 2021-08-09 DIAGNOSIS — M9902 Segmental and somatic dysfunction of thoracic region: Secondary | ICD-10-CM | POA: Diagnosis not present

## 2021-08-09 DIAGNOSIS — M4004 Postural kyphosis, thoracic region: Secondary | ICD-10-CM | POA: Diagnosis not present

## 2021-08-12 DIAGNOSIS — M1990 Unspecified osteoarthritis, unspecified site: Secondary | ICD-10-CM | POA: Diagnosis not present

## 2021-08-12 DIAGNOSIS — Z6822 Body mass index (BMI) 22.0-22.9, adult: Secondary | ICD-10-CM | POA: Diagnosis not present

## 2021-08-12 DIAGNOSIS — M1991 Primary osteoarthritis, unspecified site: Secondary | ICD-10-CM | POA: Diagnosis not present

## 2021-08-12 DIAGNOSIS — M654 Radial styloid tenosynovitis [de Quervain]: Secondary | ICD-10-CM | POA: Diagnosis not present

## 2021-08-12 DIAGNOSIS — M79641 Pain in right hand: Secondary | ICD-10-CM | POA: Diagnosis not present

## 2021-08-17 DIAGNOSIS — M4312 Spondylolisthesis, cervical region: Secondary | ICD-10-CM | POA: Diagnosis not present

## 2021-08-17 DIAGNOSIS — Z6825 Body mass index (BMI) 25.0-25.9, adult: Secondary | ICD-10-CM | POA: Diagnosis not present

## 2021-08-23 ENCOUNTER — Inpatient Hospital Stay: Payer: Medicare Other | Attending: Hematology

## 2021-08-23 DIAGNOSIS — D801 Nonfamilial hypogammaglobulinemia: Secondary | ICD-10-CM | POA: Insufficient documentation

## 2021-08-23 DIAGNOSIS — C8338 Diffuse large B-cell lymphoma, lymph nodes of multiple sites: Secondary | ICD-10-CM | POA: Diagnosis present

## 2021-08-23 DIAGNOSIS — D649 Anemia, unspecified: Secondary | ICD-10-CM | POA: Diagnosis not present

## 2021-08-23 LAB — COMPREHENSIVE METABOLIC PANEL
ALT: 12 U/L (ref 0–44)
AST: 20 U/L (ref 15–41)
Albumin: 3.4 g/dL — ABNORMAL LOW (ref 3.5–5.0)
Alkaline Phosphatase: 106 U/L (ref 38–126)
Anion gap: 5 (ref 5–15)
BUN: 18 mg/dL (ref 8–23)
CO2: 28 mmol/L (ref 22–32)
Calcium: 8.5 mg/dL — ABNORMAL LOW (ref 8.9–10.3)
Chloride: 105 mmol/L (ref 98–111)
Creatinine, Ser: 1.15 mg/dL — ABNORMAL HIGH (ref 0.44–1.00)
GFR, Estimated: 53 mL/min — ABNORMAL LOW (ref >60)
Glucose, Bld: 104 mg/dL — ABNORMAL HIGH (ref 70–99)
Potassium: 4.2 mmol/L (ref 3.5–5.1)
Sodium: 138 mmol/L (ref 135–145)
Total Bilirubin: 0.5 mg/dL (ref 0.3–1.2)
Total Protein: 6.9 g/dL (ref 6.5–8.1)

## 2021-08-23 LAB — CBC WITH DIFFERENTIAL/PLATELET
Abs Immature Granulocytes: 0.02 10*3/uL (ref 0.00–0.07)
Basophils Absolute: 0 10*3/uL (ref 0.0–0.1)
Basophils Relative: 1 %
Eosinophils Absolute: 0.2 10*3/uL (ref 0.0–0.5)
Eosinophils Relative: 4 %
HCT: 38.4 % (ref 36.0–46.0)
Hemoglobin: 12.2 g/dL (ref 12.0–15.0)
Immature Granulocytes: 0 %
Lymphocytes Relative: 30 %
Lymphs Abs: 1.9 10*3/uL (ref 0.7–4.0)
MCH: 30.3 pg (ref 26.0–34.0)
MCHC: 31.8 g/dL (ref 30.0–36.0)
MCV: 95.3 fL (ref 80.0–100.0)
Monocytes Absolute: 0.6 10*3/uL (ref 0.1–1.0)
Monocytes Relative: 9 %
Neutro Abs: 3.5 10*3/uL (ref 1.7–7.7)
Neutrophils Relative %: 56 %
Platelets: 199 10*3/uL (ref 150–400)
RBC: 4.03 MIL/uL (ref 3.87–5.11)
RDW: 12.8 % (ref 11.5–15.5)
WBC: 6.2 10*3/uL (ref 4.0–10.5)
nRBC: 0 % (ref 0.0–0.2)

## 2021-08-23 LAB — IRON AND TIBC
Iron: 74 ug/dL (ref 28–170)
Saturation Ratios: 33 % — ABNORMAL HIGH (ref 10.4–31.8)
TIBC: 228 ug/dL — ABNORMAL LOW (ref 250–450)
UIBC: 154 ug/dL

## 2021-08-23 LAB — MAGNESIUM: Magnesium: 2.1 mg/dL (ref 1.7–2.4)

## 2021-08-23 LAB — FERRITIN: Ferritin: 116 ng/mL (ref 11–307)

## 2021-08-24 ENCOUNTER — Inpatient Hospital Stay: Payer: Medicare Other

## 2021-08-24 VITALS — BP 124/55 | HR 70 | Temp 97.4°F | Resp 18 | Wt 128.6 lb

## 2021-08-24 DIAGNOSIS — D801 Nonfamilial hypogammaglobulinemia: Secondary | ICD-10-CM | POA: Diagnosis not present

## 2021-08-24 DIAGNOSIS — C8338 Diffuse large B-cell lymphoma, lymph nodes of multiple sites: Secondary | ICD-10-CM | POA: Diagnosis not present

## 2021-08-24 DIAGNOSIS — D649 Anemia, unspecified: Secondary | ICD-10-CM | POA: Diagnosis not present

## 2021-08-24 LAB — IGG, IGA, IGM
IgA: <5 mg/dL — ABNORMAL LOW (ref 87–352)
IgG (Immunoglobin G), Serum: 1293 mg/dL (ref 586–1602)
IgM (Immunoglobulin M), Srm: 68 mg/dL (ref 26–217)

## 2021-08-24 MED ORDER — DEXTROSE 5 % IV SOLN: INTRAVENOUS | Status: DC

## 2021-08-24 MED ORDER — IMMUNE GLOBULIN (HUMAN) 10 GM/100ML IV SOLN
30.0000 g | Freq: Once | INTRAVENOUS | Status: AC
  Administered 2021-08-24: 30 g via INTRAVENOUS
  Filled 2021-08-24: qty 300

## 2021-08-24 NOTE — Patient Instructions (Signed)
[facility]  Discharge Instructions: Thank you for choosing [facility] to provide your oncology and hematology care.  If you have a lab appointment with the [facility], please come in thru the Main Entrance and check in at the main information desk.  Wear comfortable clothing and clothing appropriate for easy access to any Portacath or PICC line.   We strive to give you quality time with your provider. You may need to reschedule your appointment if you arrive late (15 or more minutes).  Arriving late affects you and other patients whose appointments are after yours.  Also, if you miss three or more appointments without notifying the office, you may be dismissed from the clinic at the provider's discretion.      For prescription refill requests, have your pharmacy contact our office and allow 72 hours for refills to be completed.    Today you received the following chemotherapy and/or immunotherapy agents IVIG, return as scheduled.   To help prevent nausea and vomiting after your treatment, we encourage you to take your nausea medication as directed.  BELOW ARE SYMPTOMS THAT SHOULD BE REPORTED IMMEDIATELY: *FEVER GREATER THAN 100.4 F (38 C) OR HIGHER *CHILLS OR SWEATING *NAUSEA AND VOMITING THAT IS NOT CONTROLLED WITH YOUR NAUSEA MEDICATION *UNUSUAL SHORTNESS OF BREATH *UNUSUAL BRUISING OR BLEEDING *URINARY PROBLEMS (pain or burning when urinating, or frequent urination) *BOWEL PROBLEMS (unusual diarrhea, constipation, pain near the anus) TENDERNESS IN MOUTH AND THROAT WITH OR WITHOUT PRESENCE OF ULCERS (sore throat, sores in mouth, or a toothache) UNUSUAL RASH, SWELLING OR PAIN  UNUSUAL VAGINAL DISCHARGE OR ITCHING   Items with * indicate a potential emergency and should be followed up as soon as possible or go to the Emergency Department if any problems should occur.  Please show the CHEMOTHERAPY ALERT CARD or IMMUNOTHERAPY ALERT CARD at check-in  to the Emergency Department and triage nurse.  Should you have questions after your visit or need to cancel or reschedule your appointment, please contact [facility] [phone]  and follow the prompts.  Office hours are 8:00 a.m. to 4:30 p.m. Monday - Friday. Please note that voicemails left after 4:00 p.m. may not be returned until the following business day.  We are closed weekends and major holidays. You have access to a nurse at all times for urgent questions. Please call the main number to the clinic [phone] and follow the prompts.  For any non-urgent questions, you may also contact your provider using MyChart. We now offer e-Visits for anyone 53 and older to request care online for non-urgent symptoms. For details visit [email or web address].   Also download the MyChart app! Go to the app store, search "MyChart", open the app, select [facility], and log in with your MyChart username and password.  Masks are optional in the cancer centers. If you would like for your care team to wear a mask while they are taking care of you, please let them know. You may have one support person who is at least 66 years old accompany you for your appointments.

## 2021-08-24 NOTE — Progress Notes (Signed)
Patient presents today for IVIG, patient reports taking tylenol and benadryl at home. Patient tolerated IVIG infusion with no complaints voiced. Peripheral IV site clean and dry with good blood return noted before and after infusion. Band aid applied. VSS with discharge and left in satisfactory condition with no s/s of distress noted.

## 2021-10-18 ENCOUNTER — Other Ambulatory Visit: Payer: Self-pay

## 2021-10-18 ENCOUNTER — Inpatient Hospital Stay: Payer: Medicare Other | Attending: Hematology

## 2021-10-18 DIAGNOSIS — C8338 Diffuse large B-cell lymphoma, lymph nodes of multiple sites: Secondary | ICD-10-CM | POA: Diagnosis present

## 2021-10-18 DIAGNOSIS — D801 Nonfamilial hypogammaglobulinemia: Secondary | ICD-10-CM

## 2021-10-18 DIAGNOSIS — D649 Anemia, unspecified: Secondary | ICD-10-CM | POA: Insufficient documentation

## 2021-10-18 LAB — COMPREHENSIVE METABOLIC PANEL
ALT: 12 U/L (ref 0–44)
AST: 18 U/L (ref 15–41)
Albumin: 3.4 g/dL — ABNORMAL LOW (ref 3.5–5.0)
Alkaline Phosphatase: 106 U/L (ref 38–126)
Anion gap: 7 (ref 5–15)
BUN: 21 mg/dL (ref 8–23)
CO2: 28 mmol/L (ref 22–32)
Calcium: 8.5 mg/dL — ABNORMAL LOW (ref 8.9–10.3)
Chloride: 105 mmol/L (ref 98–111)
Creatinine, Ser: 1.33 mg/dL — ABNORMAL HIGH (ref 0.44–1.00)
GFR, Estimated: 44 mL/min — ABNORMAL LOW (ref >60)
Glucose, Bld: 95 mg/dL (ref 70–99)
Potassium: 4 mmol/L (ref 3.5–5.1)
Sodium: 140 mmol/L (ref 135–145)
Total Bilirubin: 0.5 mg/dL (ref 0.3–1.2)
Total Protein: 6.7 g/dL (ref 6.5–8.1)

## 2021-10-18 LAB — CBC WITH DIFFERENTIAL/PLATELET
Abs Immature Granulocytes: 0.03 10*3/uL (ref 0.00–0.07)
Basophils Absolute: 0.1 10*3/uL (ref 0.0–0.1)
Basophils Relative: 1 %
Eosinophils Absolute: 0.2 10*3/uL (ref 0.0–0.5)
Eosinophils Relative: 2 %
HCT: 37.1 % (ref 36.0–46.0)
Hemoglobin: 11.8 g/dL — ABNORMAL LOW (ref 12.0–15.0)
Immature Granulocytes: 0 %
Lymphocytes Relative: 16 %
Lymphs Abs: 1.4 10*3/uL (ref 0.7–4.0)
MCH: 30.7 pg (ref 26.0–34.0)
MCHC: 31.8 g/dL (ref 30.0–36.0)
MCV: 96.6 fL (ref 80.0–100.0)
Monocytes Absolute: 0.6 10*3/uL (ref 0.1–1.0)
Monocytes Relative: 7 %
Neutro Abs: 6.7 10*3/uL (ref 1.7–7.7)
Neutrophils Relative %: 74 %
Platelets: 171 10*3/uL (ref 150–400)
RBC: 3.84 MIL/uL — ABNORMAL LOW (ref 3.87–5.11)
RDW: 13 % (ref 11.5–15.5)
WBC: 8.9 10*3/uL (ref 4.0–10.5)
nRBC: 0 % (ref 0.0–0.2)

## 2021-10-18 LAB — MAGNESIUM: Magnesium: 2.1 mg/dL (ref 1.7–2.4)

## 2021-10-18 LAB — IRON AND TIBC
Iron: 49 ug/dL (ref 28–170)
Saturation Ratios: 23 % (ref 10.4–31.8)
TIBC: 217 ug/dL — ABNORMAL LOW (ref 250–450)
UIBC: 168 ug/dL

## 2021-10-18 LAB — FERRITIN: Ferritin: 77 ng/mL (ref 11–307)

## 2021-10-19 ENCOUNTER — Inpatient Hospital Stay (HOSPITAL_BASED_OUTPATIENT_CLINIC_OR_DEPARTMENT_OTHER): Payer: Medicare Other | Admitting: Hematology

## 2021-10-19 ENCOUNTER — Encounter: Payer: Self-pay | Admitting: Hematology

## 2021-10-19 ENCOUNTER — Inpatient Hospital Stay: Payer: Medicare Other

## 2021-10-19 VITALS — BP 117/71 | HR 65 | Temp 97.6°F | Resp 16

## 2021-10-19 VITALS — BP 109/69 | HR 75 | Temp 97.1°F | Resp 18 | Ht 60.0 in | Wt 132.1 lb

## 2021-10-19 DIAGNOSIS — D649 Anemia, unspecified: Secondary | ICD-10-CM

## 2021-10-19 DIAGNOSIS — D801 Nonfamilial hypogammaglobulinemia: Secondary | ICD-10-CM | POA: Diagnosis not present

## 2021-10-19 DIAGNOSIS — C8338 Diffuse large B-cell lymphoma, lymph nodes of multiple sites: Secondary | ICD-10-CM | POA: Diagnosis not present

## 2021-10-19 MED ORDER — IMMUNE GLOBULIN (HUMAN) 10 GM/100ML IV SOLN
30.0000 g | Freq: Once | INTRAVENOUS | Status: AC
  Administered 2021-10-19: 30 g via INTRAVENOUS
  Filled 2021-10-19: qty 300

## 2021-10-19 MED ORDER — DEXTROSE 5 % IV SOLN: INTRAVENOUS | Status: DC

## 2021-10-19 NOTE — Progress Notes (Signed)
[place], [place] [address]   CLINIC:  Medical Oncology/Hematology  PCP:  [name] Sites, MD [address] / [place] [address]  [phone]  REASON FOR VISIT:  Follow-up for acquired hypogammaglobulinemia and DLBCL  PRIOR THERAPY: R-CHOP x 4 cycles with intrathecal methotrexate during cycles 2 & 4  CURRENT THERAPY: IVIG every 8 weeks; intermittent Feraheme last on 12/13/2019  INTERVAL HISTORY:  [name], a 66 y.o. female, returns for follow-up of hypogammaglobulinemia and large B-cell lymphoma.  She is continuing to take iron tablets 1 daily.  Denies any bleeding per rectum or melena.  Denies any severe tiredness.  No infections reported in the last 4 months.  She reports itching sensation at the back of the neck.  REVIEW OF SYSTEMS:  Review of Systems  Constitutional:  Negative for appetite change and fatigue.  Gastrointestinal:  Negative for constipation, diarrhea, nausea and vomiting.  Skin:  Positive for itching (Back of the neck).  All other systems reviewed and are negative.   PAST MEDICAL/SURGICAL HISTORY:  Past Medical History:  Diagnosis Date   Allergic rhinitis    Anxiety    Asthma    B12 deficiency 01/18/2015   Cataract    Cavitary lung disease    Chronic sinusitis    COPD (chronic obstructive pulmonary disease) (HCC)    Depression    Diffuse large B-cell lymphoma (HCC)    DVT (deep venous thrombosis) ([facility]) 09/25/2014   Essential hypertension    GERD (gastroesophageal reflux disease)    Hematuria 04/07/2016   History of pneumonia 01/2013   Hyperlipidemia    Hypogammaglobulinemia, acquired ([facility])    Iron deficiency anemia 01/18/2015   Myocardial infarction ([employer]) 2011   Narcolepsy without cataplexy(347.00) 01/18/2015   Orofacial dyskinesia 04/22/2015   Osteoporosis    Peripheral neuropathy due to chemotherapy ([facility]) 05/02/2016   Sleep apnea    SVC syndrome 09/25/2014   Past Surgical History:   Procedure Laterality Date   ABDOMINAL HYSTERECTOMY     Fibroids   BASAL CELL CARCINOMA EXCISION  03/2011   scalp   BREAST SURGERY Right    biopsy   CATARACT EXTRACTION W/PHACO Left 11/17/2014   Procedure: CATARACT EXTRACTION PHACO AND INTRAOCULAR LENS PLACEMENT LEFT EYE;  Surgeon: Tonny Branch, MD;  Location: [facility];  Service: Ophthalmology;  Laterality: Left;  CDE:5.60   CATARACT EXTRACTION W/PHACO Right 12/15/2014   Procedure: CATARACT EXTRACTION PHACO AND INTRAOCULAR LENS PLACEMENT RIGHT EYE CDE=5.16;  Surgeon: Tonny Branch, MD;  Location: [facility];  Service: Ophthalmology;  Laterality: Right;   COLONOSCOPY N/A 11/14/2012   Procedure: COLONOSCOPY;  Surgeon: Rogene Houston, MD;  Location: [facility];  Service: Endoscopy;  Laterality: N/A;  830   COLONOSCOPY WITH PROPOFOL N/A 01/12/2018   Procedure: COLONOSCOPY WITH PROPOFOL;  Surgeon: Rogene Houston, MD;  Location: [facility];  Service: Endoscopy;  Laterality: N/A;  7:30   EYE SURGERY     NASAL SINUS SURGERY     NECK SURGERY     x 2    PERIPHERALLY INSERTED CENTRAL CATHETER INSERTION     PICC Removal     POLYPECTOMY  01/12/2018   Procedure: POLYPECTOMY;  Surgeon: Rogene Houston, MD;  Location: [facility];  Service: Endoscopy;;  colon   PORT-A-CATH REMOVAL     PORTACATH PLACEMENT  7/11   Removed 6/12   TRACHEOSTOMY     VESICOVAGINAL FISTULA CLOSURE W/ TAH      SOCIAL  HISTORY:  Social History   Socioeconomic History   Marital status: Divorced    Spouse name: Not on file   Number of children: 3   Years of education: 12   Highest education level: Not on file  Occupational History   Occupation: teller    Comment: [employer]  Tobacco Use   Smoking status: Never   Smokeless tobacco: Never  Vaping Use   Vaping Use: Never used  Substance and Sexual Activity   Alcohol use: No   Drug use: No   Sexual activity: Not on file    Comment: hyst  Other Topics Concern   Not on file  Social History Narrative    Originally from [place]. Always lived in [place]. Prior travel to [place]. 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 She has 1 dog currently. She has 1 conures (small parrots). No mold exposure in her home. 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 No hot tub exposure. She enjoys sewing & quilting.    Social Determinants of Health   Financial Resource Strain: Medium Risk (12/03/2019)   Overall Financial Resource Strain (CARDIA)    Difficulty of Paying Living Expenses: Somewhat hard  Food Insecurity: No Food Insecurity (12/03/2019)   Hunger Vital Sign    Worried About Running Out of Food in the Last Year: Never true    Ran Out of Food in the Last Year: Never true  Transportation Needs: No Transportation Needs (12/03/2019)   PRAPARE - Hydrologist (Medical): No    Lack of Transportation (Non-Medical): No  Physical Activity: Insufficiently Active (12/03/2019)   Exercise Vital Sign    Days of Exercise per Week: 2 days    Minutes of Exercise per Session: 10 min  Stress: No Stress Concern Present (12/03/2019)   [place]    Feeling of Stress : Not at all  Social Connections: Moderately Integrated (12/03/2019)   Social Connection and Isolation Panel [NHANES]    Frequency of Communication with Friends and Family: More than three times a week    Frequency of Social Gatherings with Friends and Family: More than three times a week    Attends Religious Services: More than 4 times per year    Active Member of [employer] or Organizations: Yes    Attends Music therapist: More than 4 times per year    Marital Status: Divorced  Intimate Partner Violence: Not At Risk (12/03/2019)   Humiliation, Afraid, Rape, and Kick questionnaire    Fear of Current or Ex-Partner: No    Emotionally Abused: No    Physically Abused: No    Sexually Abused:  No    FAMILY HISTORY:  Family History  Problem Relation Age of Onset   Emphysema Mother    Stroke Mother    COPD Mother    Heart disease Mother        died in sleep  20   Allergies Father    Asthma Father        as a child   Arthritis Father    Parkinson's disease Father 56   Leukemia Maternal Grandmother    Cancer Maternal Grandmother        Leukemia   Diabetes Brother    Heart attack Brother    Hypertension Brother    Heart disease Brother 59  stents   Hypertension Sister    Hypertension Brother     CURRENT MEDICATIONS:  Current Outpatient Medications  Medication Sig Dispense Refill   acetaminophen (TYLENOL) 500 MG tablet Take 1,000 mg by mouth every 6 (six) hours as needed for moderate pain or headache.      acyclovir (ZOVIRAX) 400 MG tablet Take 1 tablet (400 mg total) by mouth 2 (two) times daily. 60 tablet 6   albuterol (PROAIR HFA) 108 (90 Base) MCG/ACT inhaler Inhale 2 puffs into the lungs every 6 (six) hours as needed. (Patient taking differently: Inhale 2 puffs into the lungs every 6 (six) hours as needed for wheezing or shortness of breath.) 8.5 g 2   albuterol (PROVENTIL) (2.5 MG/3ML) 0.083% nebulizer solution Take 2.5 mg by nebulization every 4 (four) hours as needed for wheezing or shortness of breath.     amphetamine-dextroamphetamine (ADDERALL) 10 MG tablet Take 10 mg by mouth every morning.   0   Armodafinil 150 MG tablet Take 150 mg by mouth every morning.      aspirin EC 81 MG tablet Take 81 mg by mouth daily. Swallow whole.     budesonide-formoterol (SYMBICORT) 160-4.5 MCG/ACT inhaler Inhale 2 puffs into the lungs 2 (two) times daily. 1 Inhaler 5   celecoxib (CELEBREX) 200 MG capsule Take 200 mg by mouth 2 (two) times daily as needed.     Cholecalciferol (VITAMIN D3 PO) Take 1 capsule by mouth at bedtime.     citalopram (CELEXA) 10 MG tablet Take 10 mg by mouth daily.     cyanocobalamin 1000 MCG tablet Take 1,000 mcg by mouth daily.     gabapentin  (NEURONTIN) 600 MG tablet 300 mg 2 (two) times daily.      olmesartan (BENICAR) 20 MG tablet Take 20 mg by mouth daily.     omeprazole (PRILOSEC) 20 MG capsule TAKE ONE CAPSULE BY MOUTH TWICE DAILY BEFORE A MEAL. 180 capsule 0   predniSONE (DELTASONE) 10 MG tablet Take by mouth.     simvastatin (ZOCOR) 40 MG tablet Take 40 mg by mouth daily.     zafirlukast (ACCOLATE) 20 MG tablet TAKE 1 TABLET BY MOUTH TWICE DAILY. 60 tablet 0   No current facility-administered medications for this visit.   Facility-Administered Medications Ordered in Other Visits  Medication Dose Route Frequency Provider Last Rate Last Admin   0.9 %  sodium chloride infusion   Intravenous Once Kefalas, Thomas S, PA-C       0.9 %  sodium chloride infusion   Intravenous Once Kefalas, Thomas S, PA-C       acetaminophen (TYLENOL) tablet 650 mg  650 mg Oral Q6H PRN Kefalas, Thomas S, PA-C       acetaminophen (TYLENOL) tablet 650 mg  650 mg Oral Q6H PRN Kefalas, Manon Hilding, PA-C       dextrose 5 % solution   Intravenous Continuous Kefalas, Manon Hilding, PA-C   Stopped at 08/25/15 1400   dextrose 5 % solution   Intravenous Continuous Kefalas, Manon Hilding, PA-C   Stopped at 07/13/16 1350   dextrose 5 % solution   Intravenous Continuous Holley Bouche, NP   Stopped at 02/28/17 1230   diphenhydrAMINE (BENADRYL) capsule 25 mg  25 mg Oral Once Kefalas, Thomas S, PA-C       diphenhydrAMINE (BENADRYL) capsule 25 mg  25 mg Oral Once Kefalas, Thomas S, PA-C       sodium chloride 0.9 % injection 10 mL  10 mL Intracatheter PRN  Baird Cancer, PA-C   10 mL at 08/25/15 1125    ALLERGIES:  Allergies  Allergen Reactions   Meperidine Hcl Anaphylaxis   Montelukast Sodium Hives and Rash    PHYSICAL EXAM:  Performance status (ECOG): 1 - Symptomatic but completely ambulatory  There were no vitals filed for this visit. Wt Readings from Last 3 Encounters:  08/24/21 128 lb 9.6 oz (58.3 kg)  06/29/21 129 lb 3 oz (58.6 kg)  04/27/21 132 lb  (59.9 kg)   Physical Exam Vitals reviewed.  Constitutional:      Appearance: Normal appearance.  Cardiovascular:     Rate and Rhythm: Normal rate and regular rhythm.     Pulses: Normal pulses.     Heart sounds: Normal heart sounds.  Pulmonary:     Effort: Pulmonary effort is normal.     Breath sounds: Wheezing present.  Neurological:     General: No focal deficit present.     Mental Status: She is alert and oriented to person, place, and time.  Psychiatric:        Mood and Affect: Mood normal.        Behavior: Behavior normal.     LABORATORY DATA:  I have reviewed the labs as listed.     Latest Ref Rng & Units 10/18/2021    9:20 AM 08/23/2021    8:55 AM 06/28/2021   10:06 AM  CBC  WBC 4.0 - 10.5 K/uL 8.9  6.2  9.9   Hemoglobin 12.0 - 15.0 g/dL 11.8  12.2  11.9   Hematocrit 36.0 - 46.0 % 37.1  38.4  37.0   Platelets 150 - 400 K/uL 171  199  213       Latest Ref Rng & Units 10/18/2021    9:20 AM 08/23/2021    8:55 AM 06/28/2021   10:06 AM  CMP  Glucose 70 - 99 mg/dL 95  104  122   BUN 8 - 23 mg/dL '21  18  25   '$ Creatinine 0.44 - 1.00 mg/dL 1.33  1.15  1.06   Sodium 135 - 145 mmol/L 140  138  138   Potassium 3.5 - 5.1 mmol/L 4.0  4.2  4.1   Chloride 98 - 111 mmol/L 105  105  104   CO2 22 - 32 mmol/L '28  28  27   '$ Calcium 8.9 - 10.3 mg/dL 8.5  8.5  8.5   Total Protein 6.5 - 8.1 g/dL 6.7  6.9  7.2   Total Bilirubin 0.3 - 1.2 mg/dL 0.5  0.5  0.6   Alkaline Phos 38 - 126 U/L 106  106  110   AST 15 - 41 U/L '18  20  15   '$ ALT 0 - 44 U/L '12  12  10       '$ Component Value Date/Time   RBC 3.84 (L) 10/18/2021 0920   MCV 96.6 10/18/2021 0920   MCH 30.7 10/18/2021 0920   MCHC 31.8 10/18/2021 0920   RDW 13.0 10/18/2021 0920   LYMPHSABS 1.4 10/18/2021 0920   MONOABS 0.6 10/18/2021 0920   EOSABS 0.2 10/18/2021 0920   BASOSABS 0.1 10/18/2021 0920    DIAGNOSTIC IMAGING:  I have independently reviewed the scans and discussed with the patient. No results found.   ASSESSMENT:   1. Acquired hypogammaglobulinemia: -She has been receiving IVIG every 8 weeks since January 2020. -She does have severely low IgA levels.  However he does not have any reactions.   2.  DLBCL, stage IVb: -She was diagnosed 07/08/2009.  Treated with 4 cycles of R-CHOP with intrathecal methotrexate during cycle 2 and 4.  She developed a Pseudomonas sepsis after cycles 2 and 4 and therapy was stopped after 4 cycles.  She also received 2 years of maintenance rituximab. -Last PET scan on 03/20/2017 did not show any evidence of lymphoma.   3.  Health maintenance: -Mammogram on 11/08/2018, BI-RADS Category 1.   PLAN:  1. Acquired hypogammaglobulinemia: - She does not have any infections in the last 4 months. - IgG trough level was 1293. - Labs today shows normal LFTs and CBC.  Proceed with IVIG today and every 8 weeks.  RTC 4 months for follow-up with repeat labs.   2.  DLBCL, stage IVb: - No palpable adenopathy.  No B symptoms.   3.  Normocytic anemia: - She is taking iron tablet daily without any GI issues. - Ferritin is 77, down from 116.  Percent saturation is 23.  Hemoglobin is 11.8. - Continue iron tablet daily.  Will check ferritin and iron panel next visit.  If it continues to drop, will consider parenteral iron therapy.  Orders placed this encounter:  No orders of the defined types were placed in this encounter.    Derek Jack, MD [facility] [phone]

## 2021-10-19 NOTE — Progress Notes (Signed)
Pt presents today for IVIG per provider's order. Vital signs and labs WNL for treatment today. Okay to proceed with treatment per Dr.K.  Pt took pre-meds Tylenol and Benadryl at home prior to arrival. Peripheral IV started with good blood return pre and post infusion.  IVIG given today per MD orders. Tolerated infusion without adverse affects. Vital signs stable. No complaints at this time. Discharged from clinic ambulatory in stable condition. Alert and oriented x 3. F/U with [facility] as scheduled.

## 2021-10-19 NOTE — Patient Instructions (Signed)
[facility]  Discharge Instructions: Thank you for choosing [facility] to provide your oncology and hematology care.  If you have a lab appointment with the [facility], please come in thru the Main Entrance and check in at the main information desk.  Wear comfortable clothing and clothing appropriate for easy access to any Portacath or PICC line.   We strive to give you quality time with your provider. You may need to reschedule your appointment if you arrive late (15 or more minutes).  Arriving late affects you and other patients whose appointments are after yours.  Also, if you miss three or more appointments without notifying the office, you may be dismissed from the clinic at the provider's discretion.      For prescription refill requests, have your pharmacy contact our office and allow 72 hours for refills to be completed.    Today you received IVIG     BELOW ARE SYMPTOMS THAT SHOULD BE REPORTED IMMEDIATELY: *FEVER GREATER THAN 100.4 F (38 C) OR HIGHER *CHILLS OR SWEATING *NAUSEA AND VOMITING THAT IS NOT CONTROLLED WITH YOUR NAUSEA MEDICATION *UNUSUAL SHORTNESS OF BREATH *UNUSUAL BRUISING OR BLEEDING *URINARY PROBLEMS (pain or burning when urinating, or frequent urination) *BOWEL PROBLEMS (unusual diarrhea, constipation, pain near the anus) TENDERNESS IN MOUTH AND THROAT WITH OR WITHOUT PRESENCE OF ULCERS (sore throat, sores in mouth, or a toothache) UNUSUAL RASH, SWELLING OR PAIN  UNUSUAL VAGINAL DISCHARGE OR ITCHING   Items with * indicate a potential emergency and should be followed up as soon as possible or go to the Emergency Department if any problems should occur.  Please show the CHEMOTHERAPY ALERT CARD or IMMUNOTHERAPY ALERT CARD at check-in to the Emergency Department and triage nurse.  Should you have questions after your visit or need to cancel or reschedule your appointment, please contact [facility]  [phone]  and follow the prompts.  Office hours are 8:00 a.m. to 4:30 p.m. Monday - Friday. Please note that voicemails left after 4:00 p.m. may not be returned until the following business day.  We are closed weekends and major holidays. You have access to a nurse at all times for urgent questions. Please call the main number to the clinic [phone] and follow the prompts.  For any non-urgent questions, you may also contact your provider using MyChart. We now offer e-Visits for anyone 10 and older to request care online for non-urgent symptoms. For details visit [email or web address].   Also download the MyChart app! Go to the app store, search "MyChart", open the app, select [facility], and log in with your MyChart username and password.  Masks are optional in the cancer centers. If you would like for your care team to wear a mask while they are taking care of you, please let them know. You may have one support person who is at least 66 years old accompany you for your appointments.

## 2021-10-19 NOTE — Patient Instructions (Addendum)
[employer] at [facility] Discharge Instructions   You were seen and examined today by Dr. Delton Coombes.  He reviewed the results of your lab work which are normal/stable.   We will proceed with your IVIG infusion today.   Continue iron tablet daily.  Return as scheduled in 4 months.    Thank you for choosing [facility] at [facility] to provide your oncology and hematology care.  To afford each patient quality time with our provider, please arrive at least 15 minutes before your scheduled appointment time.   If you have a lab appointment with the [facility] please come in thru the Main Entrance and check in at the main information desk.  You need to re-schedule your appointment should you arrive 10 or more minutes late.  We strive to give you quality time with our providers, and arriving late affects you and other patients whose appointments are after yours.  Also, if you no show three or more times for appointments you may be dismissed from the clinic at the providers discretion.     Again, thank you for choosing [facility].  Our hope is that these requests will decrease the amount of time that you wait before being seen by our physicians.       _____________________________________________________________  Should you have questions after your visit to [facility], please contact our office at [phone] and follow the prompts.  Our office hours are 8:00 a.m. and 4:30 p.m. Monday - Friday.  Please note that voicemails left after 4:00 p.m. may not be returned until the following business day.  We are closed weekends and major holidays.  You do have access to a nurse 24-7, just call the main number to the clinic [phone] and do not press any options, hold on the line and a nurse will answer the phone.    For prescription refill requests, have your pharmacy contact our office and allow 72 hours.    Due to  Covid, you will need to wear a mask upon entering the hospital. If you do not have a mask, a mask will be given to you at the Main Entrance upon arrival. For doctor visits, patients may have 1 support person age 75 or older with them. For treatment visits, patients can not have anyone with them due to social distancing guidelines and our immunocompromised population.

## 2021-11-10 DIAGNOSIS — Z6823 Body mass index (BMI) 23.0-23.9, adult: Secondary | ICD-10-CM | POA: Diagnosis not present

## 2021-11-10 DIAGNOSIS — Z0001 Encounter for general adult medical examination with abnormal findings: Secondary | ICD-10-CM | POA: Diagnosis not present

## 2021-11-10 DIAGNOSIS — Z1331 Encounter for screening for depression: Secondary | ICD-10-CM | POA: Diagnosis not present

## 2021-11-10 DIAGNOSIS — I10 Essential (primary) hypertension: Secondary | ICD-10-CM | POA: Diagnosis not present

## 2021-11-10 DIAGNOSIS — F419 Anxiety disorder, unspecified: Secondary | ICD-10-CM | POA: Diagnosis not present

## 2021-11-19 ENCOUNTER — Ambulatory Visit: Payer: Medicare Other | Attending: Student | Admitting: Student

## 2021-11-19 ENCOUNTER — Encounter: Payer: Self-pay | Admitting: Student

## 2021-11-19 VITALS — BP 104/58 | HR 80 | Ht 61.0 in | Wt 131.8 lb

## 2021-11-19 DIAGNOSIS — I491 Atrial premature depolarization: Secondary | ICD-10-CM | POA: Diagnosis not present

## 2021-11-19 DIAGNOSIS — I10 Essential (primary) hypertension: Secondary | ICD-10-CM

## 2021-11-19 DIAGNOSIS — E782 Mixed hyperlipidemia: Secondary | ICD-10-CM

## 2021-11-19 DIAGNOSIS — M4722 Other spondylosis with radiculopathy, cervical region: Secondary | ICD-10-CM | POA: Diagnosis not present

## 2021-11-19 DIAGNOSIS — I350 Nonrheumatic aortic (valve) stenosis: Secondary | ICD-10-CM | POA: Diagnosis not present

## 2021-11-19 DIAGNOSIS — M4312 Spondylolisthesis, cervical region: Secondary | ICD-10-CM | POA: Diagnosis not present

## 2021-11-19 DIAGNOSIS — Z6825 Body mass index (BMI) 25.0-25.9, adult: Secondary | ICD-10-CM | POA: Diagnosis not present

## 2021-11-19 NOTE — Patient Instructions (Signed)
Medication Instructions:   Continue current medications.   *If you need a refill on your cardiac medications before your next appointment, please call your pharmacy*   Testing/Procedures:  Call back in early 2024 to schedule your echocardiogram (ultrasound of your heart)   Follow-Up: At [facility], you and your health needs are our priority.  As part of our continuing mission to provide you with exceptional heart care, we have created designated Provider Care Teams.  These Care Teams include your primary Cardiologist (physician) and Advanced Practice Providers (APPs -  Physician Assistants and Nurse Practitioners) who all work together to provide you with the care you need, when you need it.  We recommend signing up for the patient portal called "MyChart".  Sign up information is provided on this After Visit Summary.  MyChart is used to connect with patients for Virtual Visits (Telemedicine).  Patients are able to view lab/test results, encounter notes, upcoming appointments, etc.  Non-urgent messages can be sent to your provider as well.   To learn more about what you can do with MyChart, go to [email or web address].    Your next appointment:   1 year(s)  The format for your next appointment:   In Person  Provider:   You may see Rozann Lesches, MD or one of the following Advanced Practice Providers on your designated Care Team:   Bernerd Pho, PA-C  Ermalinda Barrios, PA-C     Important Information About [place]

## 2021-11-19 NOTE — Progress Notes (Signed)
Cardiology Office Note    Date:  11/19/2021   ID:  [name], DOB 10-Mar-1955, MRN [id number]  PCP:  Sharilyn Sites, MD  Cardiologist: Rozann Lesches, MD    Chief Complaint  Patient presents with   Follow-up    Annual Visit    History of Present Illness:    [name] is a 66 y.o. female with past medical history of HTN, HLD, iron deficiency anemia, COPD, prior DVT and history of B-cell lymphoma (previously treated with R-CHOP and Methotrexate) who presents to the office today for overdue annual follow-up.  She was examined by Dr. Domenic Polite in 01/2020 as a new patient referral for a heart murmur and reported having baseline dyspnea on exertion in the setting of asthma/COPD but denied any exertional chest pain. She was continued on her current medical therapy and a follow-up echocardiogram was recommended. This showed a preserved ejection fraction of 60 to 65% with no wall motion abnormalities. RV function was normal. She did have mild AI and mild aortic stenosis along with trivial MR. She was encouraged to follow-up in 1 year.  In talking with the patient today, she reports overall doing well since her last office visit. She does have baseline dyspnea on exertion in the setting of asthma but denies any acute changes in her respiratory status. No specific orthopnea, PND or pitting edema. No recent chest pain.  She does report occasional palpitations which last for a few seconds and spontaneously resolve. She remains very active at baseline as she continues to work at the [employer] and also helps with Environmental health practitioner.   Past Medical History:  Diagnosis Date   Allergic rhinitis    Anxiety    Asthma    B12 deficiency 01/18/2015   Cataract    Cavitary lung disease    Chronic sinusitis    COPD (chronic obstructive pulmonary disease) (HCC)    Depression    Diffuse large B-cell lymphoma (HCC)    DVT (deep venous thrombosis) ([place]) 09/25/2014    Essential hypertension    GERD (gastroesophageal reflux disease)    Hematuria 04/07/2016   History of pneumonia 01/2013   Hyperlipidemia    Hypogammaglobulinemia, acquired ([place])    Iron deficiency anemia 01/18/2015   Myocardial infarction ([place]) 2011   Narcolepsy without cataplexy(347.00) 01/18/2015   Orofacial dyskinesia 04/22/2015   Osteoporosis    Peripheral neuropathy due to chemotherapy ([place]) 05/02/2016   Sleep apnea    SVC syndrome 09/25/2014    Past Surgical History:  Procedure Laterality Date   ABDOMINAL HYSTERECTOMY     Fibroids   BASAL CELL CARCINOMA EXCISION  03/2011   scalp   BREAST SURGERY Right    biopsy   CATARACT EXTRACTION W/PHACO Left 11/17/2014   Procedure: CATARACT EXTRACTION PHACO AND INTRAOCULAR LENS PLACEMENT LEFT EYE;  Surgeon: Tonny Branch, MD;  Location: [facility];  Service: Ophthalmology;  Laterality: Left;  CDE:5.60   CATARACT EXTRACTION W/PHACO Right 12/15/2014   Procedure: CATARACT EXTRACTION PHACO AND INTRAOCULAR LENS PLACEMENT RIGHT EYE CDE=5.16;  Surgeon: Tonny Branch, MD;  Location: [facility];  Service: Ophthalmology;  Laterality: Right;   COLONOSCOPY N/A 11/14/2012   Procedure: COLONOSCOPY;  Surgeon: Rogene Houston, MD;  Location: [facility];  Service: Endoscopy;  Laterality: N/A;  830   COLONOSCOPY WITH PROPOFOL N/A 01/12/2018   Procedure: COLONOSCOPY WITH PROPOFOL;  Surgeon: Rogene Houston, MD;  Location: [facility];  Service: Endoscopy;  Laterality: N/A;  7:30  EYE SURGERY     NASAL SINUS SURGERY     NECK SURGERY     x 2    PERIPHERALLY INSERTED CENTRAL CATHETER INSERTION     PICC Removal     POLYPECTOMY  01/12/2018   Procedure: POLYPECTOMY;  Surgeon: Rogene Houston, MD;  Location: [facility];  Service: Endoscopy;;  colon   PORT-A-CATH REMOVAL     PORTACATH PLACEMENT  7/11   Removed 6/12   TRACHEOSTOMY     VESICOVAGINAL FISTULA CLOSURE W/ TAH      Current Medications: Outpatient Medications Prior to Visit  Medication Sig Dispense Refill    acetaminophen (TYLENOL) 500 MG tablet Take 1,000 mg by mouth every 6 (six) hours as needed for moderate pain or headache.      acyclovir (ZOVIRAX) 400 MG tablet Take 1 tablet (400 mg total) by mouth 2 (two) times daily. 60 tablet 6   albuterol (PROAIR HFA) 108 (90 Base) MCG/ACT inhaler Inhale 2 puffs into the lungs every 6 (six) hours as needed. (Patient taking differently: Inhale 2 puffs into the lungs every 6 (six) hours as needed for wheezing or shortness of breath.) 8.5 g 2   albuterol (PROVENTIL) (2.5 MG/3ML) 0.083% nebulizer solution Take 2.5 mg by nebulization every 4 (four) hours as needed for wheezing or shortness of breath.     amphetamine-dextroamphetamine (ADDERALL) 10 MG tablet Take 10 mg by mouth every morning.   0   Armodafinil 150 MG tablet Take 150 mg by mouth every morning.      aspirin EC 81 MG tablet Take 81 mg by mouth daily. Swallow whole.     budesonide-formoterol (SYMBICORT) 160-4.5 MCG/ACT inhaler Inhale 2 puffs into the lungs 2 (two) times daily. 1 Inhaler 5   celecoxib (CELEBREX) 200 MG capsule Take 200 mg by mouth 2 (two) times daily as needed.     Cholecalciferol (VITAMIN D3 PO) Take 1 capsule by mouth at bedtime.     citalopram (CELEXA) 10 MG tablet Take 10 mg by mouth daily.     cyanocobalamin 1000 MCG tablet Take 1,000 mcg by mouth daily.     gabapentin (NEURONTIN) 600 MG tablet 300 mg 2 (two) times daily.      olmesartan (BENICAR) 20 MG tablet Take 20 mg by mouth daily.     omeprazole (PRILOSEC) 20 MG capsule TAKE ONE CAPSULE BY MOUTH TWICE DAILY BEFORE A MEAL. 180 capsule 0   predniSONE (DELTASONE) 10 MG tablet Take by mouth.     simvastatin (ZOCOR) 40 MG tablet Take 40 mg by mouth daily.     zafirlukast (ACCOLATE) 20 MG tablet TAKE 1 TABLET BY MOUTH TWICE DAILY. 60 tablet 0   Facility-Administered Medications Prior to Visit  Medication Dose Route Frequency Provider Last Rate Last Admin   0.9 %  sodium chloride infusion   Intravenous Once Kefalas, Thomas S,  PA-C       0.9 %  sodium chloride infusion   Intravenous Once Kefalas, Thomas S, PA-C       acetaminophen (TYLENOL) tablet 650 mg  650 mg Oral Q6H PRN Kefalas, Thomas S, PA-C       acetaminophen (TYLENOL) tablet 650 mg  650 mg Oral Q6H PRN Kefalas, Manon Hilding, PA-C       dextrose 5 % solution   Intravenous Continuous Kefalas, Manon Hilding, PA-C   Stopped at 08/25/15 1400   dextrose 5 % solution   Intravenous Continuous Baird Cancer, PA-C   Stopped at 07/13/16 1350  dextrose 5 % solution   Intravenous Continuous Holley Bouche, NP   Stopped at 02/28/17 1230   diphenhydrAMINE (BENADRYL) capsule 25 mg  25 mg Oral Once Kefalas, Thomas S, PA-C       diphenhydrAMINE (BENADRYL) capsule 25 mg  25 mg Oral Once Kefalas, Thomas S, PA-C       sodium chloride 0.9 % injection 10 mL  10 mL Intracatheter PRN Baird Cancer, PA-C   10 mL at 08/25/15 1125     Allergies:   Meperidine hcl and Montelukast sodium   Social History   Socioeconomic History   Marital status: Divorced    Spouse name: Not on file   Number of children: 3   Years of education: 12   Highest education level: Not on file  Occupational History   Occupation: teller    Comment: [employer]  Tobacco Use   Smoking status: Never   Smokeless tobacco: Never  Vaping Use   Vaping Use: Never used  Substance and Sexual Activity   Alcohol use: No   Drug use: No   Sexual activity: Not on file    Comment: hyst  Other Topics Concern   Not on file  Social History Narrative   Originally from [place]. Always lived in [place]. Prior travel to [place]. 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 She has 1 dog currently. She has 1 conures (small parrots). No mold exposure in her home. 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 No hot tub exposure. She enjoys sewing & quilting.    Social Determinants of Health   Financial Resource Strain: Medium Risk (12/03/2019)   Overall  Financial Resource Strain (CARDIA)    Difficulty of Paying Living Expenses: Somewhat hard  Food Insecurity: No Food Insecurity (12/03/2019)   Hunger Vital Sign    Worried About Running Out of Food in the Last Year: Never true    Ran Out of Food in the Last Year: Never true  Transportation Needs: No Transportation Needs (12/03/2019)   PRAPARE - Hydrologist (Medical): No    Lack of Transportation (Non-Medical): No  Physical Activity: Insufficiently Active (12/03/2019)   Exercise Vital Sign    Days of Exercise per Week: 2 days    Minutes of Exercise per Session: 10 min  Stress: No Stress Concern Present (12/03/2019)   [place]    Feeling of Stress : Not at all  Social Connections: Moderately Integrated (12/03/2019)   Social Connection and Isolation Panel [NHANES]    Frequency of Communication with Friends and Family: More than three times a week    Frequency of Social Gatherings with Friends and Family: More than three times a week    Attends Religious Services: More than 4 times per year    Active Member of [employer] or Organizations: Yes    Attends Music therapist: More than 4 times per year    Marital Status: Divorced     Family History:  The patient's family history includes Allergies in her father; Arthritis in her father; Asthma in her father; COPD in her mother; Cancer in her maternal grandmother; Diabetes in her brother; Emphysema in her mother; Heart attack in her brother; Heart disease in her mother; Heart disease (age of onset: 50) in her brother; Hypertension in her brother, brother, and sister; Leukemia in her maternal  grandmother; Parkinson's disease (age of onset: 56) in her father; Stroke in her mother.   Review of Systems:    Please see the history of present illness.     All other systems reviewed and are otherwise negative except as noted above.   Physical  Exam:    VS:  BP (!) 104/58   Pulse 80   Ht '5\' 1"'$  (1.549 m)   Wt 131 lb 12.8 oz (59.8 kg)   SpO2 94%   BMI 24.90 kg/m    General: Well developed, well nourished,female appearing in no acute distress. Head: Normocephalic, atraumatic. Neck: No carotid bruits. JVD not elevated.  Lungs: Respirations regular and unlabored, without wheezes or rales.  Heart: Regular rate and rhythm. No S3 or S4. 2/6 SEM along RUSB.  Abdomen: Appears non-distended. No obvious abdominal masses. Msk:  Strength and tone appear normal for age. No obvious joint deformities or effusions. Extremities: No clubbing or cyanosis. No pitting edema.  Distal pedal pulses are 2+ bilaterally. Neuro: Alert and oriented X 3. Moves all extremities spontaneously. No focal deficits noted. Psych:  Responds to questions appropriately with a normal affect. Skin: No rashes or lesions noted  Wt Readings from Last 3 Encounters:  11/19/21 131 lb 12.8 oz (59.8 kg)  10/19/21 132 lb 1.6 oz (59.9 kg)  08/24/21 128 lb 9.6 oz (58.3 kg)     Studies/Labs Reviewed:   EKG:  EKG is ordered today.  The ekg ordered today demonstrates NSR, HR 80 with occasional PAC's.   Recent Labs: 10/18/2021: ALT 12; BUN 21; Creatinine, Ser 1.33; Hemoglobin 11.8; Magnesium 2.1; Platelets 171; Potassium 4.0; Sodium 140   Lipid Panel    Component Value Date/Time   CHOL 162 05/02/2016 1044   TRIG 150 (H) 05/02/2016 1044   HDL 65 05/02/2016 1044   CHOLHDL 2.5 05/02/2016 1044   VLDL 30 05/02/2016 1044   LDLCALC 67 05/02/2016 1044    Additional studies/ records that were reviewed today include:   Echocardiogram: 02/2020 IMPRESSIONS     1. Left ventricular ejection fraction, by estimation, is 60 to 65%. The  left ventricle has normal function. The left ventricle has no regional  wall motion abnormalities. Left ventricular diastolic parameters are  indeterminate.   2. Right ventricular systolic function is normal. The right ventricular  size is  normal. Tricuspid regurgitation signal is inadequate for assessing  PA pressure.   3. The mitral valve is grossly normal. Trivial mitral valve  regurgitation.   4. The aortic valve is probably tricuspid. There is moderate  calcification of the aortic valve. Aortic valve regurgitation is mild.  Mild aortic valve stenosis. Aortic valve area, by VTI measures 1.64 cm.  Aortic valve mean gradient measures 8.0 mmHg.  Dimentionless index 0.72.   5. The inferior vena cava is dilated in size with >50% respiratory  variability, suggesting right atrial pressure of 8 mmHg.   Assessment:    1. Nonrheumatic aortic valve stenosis   2. PAC (premature atrial contraction)   3. Essential hypertension   4. Mixed hyperlipidemia      Plan:   In order of problems listed above:  1. Valvular Heart Disease - Echocardiogram in 02/2020 showed trivial MR, mild AI and mild aortic stenosis. She has baseline dyspnea on exertion in the setting of asthma but denies any progression of symptoms or associated dizziness. Will plan to obtain a follow-up echocardiogram in the next few months for reassessment of her aortic stenosis and aortic regurgitation.  2. PAC's -  Noted on EKG today and she was asymptomatic at that time. She does report feeling occasional palpitations at home but reports they only last for a few seconds and spontaneously resolve. Occur less than once a month. I encouraged her to make [place] aware if these increase in frequency or severity as we could place a Zio patch.   3. HTN - Her BP is well-controlled at 104/58 during today's visit. Continue current medical therapy with Olmesartan 20 mg daily.  4. HLD - Recent labs from her PCP earlier this month showed her total cholesterol was at 158 with LDL at 77. She remains on Simvastatin 40 mg daily.    Medication Adjustments/Labs and Tests Ordered: Current medicines are reviewed at length with the patient today.  Concerns regarding medicines are  outlined above.  Medication changes, Labs and Tests ordered today are listed in the Patient Instructions below. Patient Instructions  Medication Instructions:   Continue current medications.   *If you need a refill on your cardiac medications before your next appointment, please call your pharmacy*   Testing/Procedures:  Call back in early 2024 to schedule your echocardiogram (ultrasound of your heart)   Follow-Up: At [facility], you and your health needs are our priority.  As part of our continuing mission to provide you with exceptional heart care, we have created designated Provider Care Teams.  These Care Teams include your primary Cardiologist (physician) and Advanced Practice Providers (APPs -  Physician Assistants and Nurse Practitioners) who all work together to provide you with the care you need, when you need it.  We recommend signing up for the patient portal called "MyChart".  Sign up information is provided on this After Visit Summary.  MyChart is used to connect with patients for Virtual Visits (Telemedicine).  Patients are able to view lab/test results, encounter notes, upcoming appointments, etc.  Non-urgent messages can be sent to your provider as well.   To learn more about what you can do with MyChart, go to [email or web address].    Your next appointment:   1 year(s)  The format for your next appointment:   In Person  Provider:   You may see Rozann Lesches, MD or one of the following Advanced Practice Providers on your designated Care Team:   Bernerd Pho, PA-C  Ermalinda Barrios, PA-C     Important Information About Sugar         Signed, Erma Heritage, Hershal Coria  11/19/2021 3:04 PM    [facility]. [address] [place], [place] [address] Phone: [phone] Fax: [phone]

## 2021-11-22 ENCOUNTER — Other Ambulatory Visit: Payer: Self-pay | Admitting: *Deleted

## 2021-11-22 DIAGNOSIS — D801 Nonfamilial hypogammaglobulinemia: Secondary | ICD-10-CM

## 2021-11-22 MED ORDER — ACYCLOVIR 400 MG PO TABS: 400.0000 mg | ORAL_TABLET | Freq: Two times a day (BID) | ORAL | 6 refills | Status: DC

## 2021-12-02 DIAGNOSIS — I10 Essential (primary) hypertension: Secondary | ICD-10-CM | POA: Diagnosis not present

## 2021-12-02 DIAGNOSIS — K219 Gastro-esophageal reflux disease without esophagitis: Secondary | ICD-10-CM | POA: Diagnosis not present

## 2021-12-02 DIAGNOSIS — N183 Chronic kidney disease, stage 3 unspecified: Secondary | ICD-10-CM | POA: Diagnosis not present

## 2021-12-03 DIAGNOSIS — M4316 Spondylolisthesis, lumbar region: Secondary | ICD-10-CM | POA: Diagnosis not present

## 2021-12-06 DIAGNOSIS — M4312 Spondylolisthesis, cervical region: Secondary | ICD-10-CM | POA: Diagnosis not present

## 2021-12-06 DIAGNOSIS — M4692 Unspecified inflammatory spondylopathy, cervical region: Secondary | ICD-10-CM | POA: Diagnosis not present

## 2021-12-06 DIAGNOSIS — M4802 Spinal stenosis, cervical region: Secondary | ICD-10-CM | POA: Diagnosis not present

## 2021-12-13 ENCOUNTER — Other Ambulatory Visit: Payer: Self-pay

## 2021-12-13 DIAGNOSIS — D649 Anemia, unspecified: Secondary | ICD-10-CM

## 2021-12-13 DIAGNOSIS — C8338 Diffuse large B-cell lymphoma, lymph nodes of multiple sites: Secondary | ICD-10-CM

## 2021-12-14 ENCOUNTER — Inpatient Hospital Stay: Payer: Medicare Other

## 2021-12-14 ENCOUNTER — Inpatient Hospital Stay: Payer: Medicare Other | Attending: Hematology

## 2021-12-14 VITALS — BP 117/80 | HR 73 | Temp 97.7°F | Resp 18 | Wt 130.0 lb

## 2021-12-14 DIAGNOSIS — C8338 Diffuse large B-cell lymphoma, lymph nodes of multiple sites: Secondary | ICD-10-CM

## 2021-12-14 DIAGNOSIS — D801 Nonfamilial hypogammaglobulinemia: Secondary | ICD-10-CM | POA: Diagnosis not present

## 2021-12-14 DIAGNOSIS — D649 Anemia, unspecified: Secondary | ICD-10-CM

## 2021-12-14 LAB — FERRITIN: Ferritin: 101 ng/mL (ref 11–307)

## 2021-12-14 LAB — CBC WITH DIFFERENTIAL/PLATELET
Abs Immature Granulocytes: 0.01 10*3/uL (ref 0.00–0.07)
Basophils Absolute: 0 10*3/uL (ref 0.0–0.1)
Basophils Relative: 1 %
Eosinophils Absolute: 0.5 10*3/uL (ref 0.0–0.5)
Eosinophils Relative: 7 %
HCT: 39.5 % (ref 36.0–46.0)
Hemoglobin: 12.6 g/dL (ref 12.0–15.0)
Immature Granulocytes: 0 %
Lymphocytes Relative: 25 %
Lymphs Abs: 1.6 10*3/uL (ref 0.7–4.0)
MCH: 30.7 pg (ref 26.0–34.0)
MCHC: 31.9 g/dL (ref 30.0–36.0)
MCV: 96.3 fL (ref 80.0–100.0)
Monocytes Absolute: 0.6 10*3/uL (ref 0.1–1.0)
Monocytes Relative: 9 %
Neutro Abs: 3.8 10*3/uL (ref 1.7–7.7)
Neutrophils Relative %: 58 %
Platelets: 179 10*3/uL (ref 150–400)
RBC: 4.1 MIL/uL (ref 3.87–5.11)
RDW: 12 % (ref 11.5–15.5)
WBC: 6.4 10*3/uL (ref 4.0–10.5)
nRBC: 0 % (ref 0.0–0.2)

## 2021-12-14 LAB — COMPREHENSIVE METABOLIC PANEL
ALT: 9 U/L (ref 0–44)
AST: 18 U/L (ref 15–41)
Albumin: 3.5 g/dL (ref 3.5–5.0)
Alkaline Phosphatase: 100 U/L (ref 38–126)
Anion gap: 9 (ref 5–15)
BUN: 15 mg/dL (ref 8–23)
CO2: 28 mmol/L (ref 22–32)
Calcium: 8.9 mg/dL (ref 8.9–10.3)
Chloride: 102 mmol/L (ref 98–111)
Creatinine, Ser: 0.99 mg/dL (ref 0.44–1.00)
GFR, Estimated: >60 mL/min (ref >60)
Glucose, Bld: 123 mg/dL — ABNORMAL HIGH (ref 70–99)
Potassium: 4 mmol/L (ref 3.5–5.1)
Sodium: 139 mmol/L (ref 135–145)
Total Bilirubin: 0.5 mg/dL (ref 0.3–1.2)
Total Protein: 7.2 g/dL (ref 6.5–8.1)

## 2021-12-14 LAB — IRON AND TIBC
Iron: 73 ug/dL (ref 28–170)
Saturation Ratios: 33 % — ABNORMAL HIGH (ref 10.4–31.8)
TIBC: 222 ug/dL — ABNORMAL LOW (ref 250–450)
UIBC: 149 ug/dL

## 2021-12-14 LAB — MAGNESIUM: Magnesium: 1.9 mg/dL (ref 1.7–2.4)

## 2021-12-14 MED ORDER — IMMUNE GLOBULIN (HUMAN) 10 GM/100ML IV SOLN
30.0000 g | Freq: Once | INTRAVENOUS | Status: AC
  Administered 2021-12-14: 30 g via INTRAVENOUS
  Filled 2021-12-14: qty 300

## 2021-12-14 MED ORDER — DEXTROSE 5 % IV SOLN: INTRAVENOUS | Status: DC

## 2021-12-14 NOTE — Patient Instructions (Signed)
[facility]  Discharge Instructions: Thank you for choosing [facility] to provide your oncology and hematology care.  If you have a lab appointment with the [facility], please come in thru the Main Entrance and check in at the main information desk.  Wear comfortable clothing and clothing appropriate for easy access to any Portacath or PICC line.   We strive to give you quality time with your provider. You may need to reschedule your appointment if you arrive late (15 or more minutes).  Arriving late affects you and other patients whose appointments are after yours.  Also, if you miss three or more appointments without notifying the office, you may be dismissed from the clinic at the provider's discretion.      For prescription refill requests, have your pharmacy contact our office and allow 72 hours for refills to be completed.    Today you received the following chemotherapy and/or immunotherapy agents IVIG      To help prevent nausea and vomiting after your treatment, we encourage you to take your nausea medication as directed.  BELOW ARE SYMPTOMS THAT SHOULD BE REPORTED IMMEDIATELY: *FEVER GREATER THAN 100.4 F (38 C) OR HIGHER *CHILLS OR SWEATING *NAUSEA AND VOMITING THAT IS NOT CONTROLLED WITH YOUR NAUSEA MEDICATION *UNUSUAL SHORTNESS OF BREATH *UNUSUAL BRUISING OR BLEEDING *URINARY PROBLEMS (pain or burning when urinating, or frequent urination) *BOWEL PROBLEMS (unusual diarrhea, constipation, pain near the anus) TENDERNESS IN MOUTH AND THROAT WITH OR WITHOUT PRESENCE OF ULCERS (sore throat, sores in mouth, or a toothache) UNUSUAL RASH, SWELLING OR PAIN  UNUSUAL VAGINAL DISCHARGE OR ITCHING   Items with * indicate a potential emergency and should be followed up as soon as possible or go to the Emergency Department if any problems should occur.  Please show the CHEMOTHERAPY ALERT CARD or IMMUNOTHERAPY ALERT CARD at check-in to the Emergency  Department and triage nurse.  Should you have questions after your visit or need to cancel or reschedule your appointment, please contact [facility] [phone]  and follow the prompts.  Office hours are 8:00 a.m. to 4:30 p.m. Monday - Friday. Please note that voicemails left after 4:00 p.m. may not be returned until the following business day.  We are closed weekends and major holidays. You have access to a nurse at all times for urgent questions. Please call the main number to the clinic [phone] and follow the prompts.  For any non-urgent questions, you may also contact your provider using MyChart. We now offer e-Visits for anyone 73 and older to request care online for non-urgent symptoms. For details visit [email or web address].   Also download the MyChart app! Go to the app store, search "MyChart", open the app, select [facility], and log in with your MyChart username and password.  Masks are optional in the cancer centers. If you would like for your care team to wear a mask while they are taking care of you, please let them know. You may have one support person who is at least 66 years old accompany you for your appointments.

## 2021-12-14 NOTE — Progress Notes (Signed)
IVIG infusion given per orders. Patient tolerated it well without problems. Vitals stable and discharged home from clinic ambulatory. Follow up as scheduled.

## 2021-12-31 DIAGNOSIS — M4312 Spondylolisthesis, cervical region: Secondary | ICD-10-CM | POA: Diagnosis not present

## 2021-12-31 DIAGNOSIS — M4722 Other spondylosis with radiculopathy, cervical region: Secondary | ICD-10-CM | POA: Diagnosis not present

## 2022-01-03 ENCOUNTER — Encounter (HOSPITAL_COMMUNITY): Payer: Self-pay | Admitting: Hematology

## 2022-01-07 DIAGNOSIS — F909 Attention-deficit hyperactivity disorder, unspecified type: Secondary | ICD-10-CM | POA: Diagnosis not present

## 2022-01-10 ENCOUNTER — Inpatient Hospital Stay: Payer: BLUE CROSS/BLUE SHIELD

## 2022-01-11 ENCOUNTER — Inpatient Hospital Stay: Payer: BLUE CROSS/BLUE SHIELD

## 2022-01-11 ENCOUNTER — Inpatient Hospital Stay: Payer: BLUE CROSS/BLUE SHIELD | Admitting: Hematology

## 2022-02-02 ENCOUNTER — Encounter (HOSPITAL_COMMUNITY): Payer: Self-pay | Admitting: Hematology

## 2022-02-02 DIAGNOSIS — I10 Essential (primary) hypertension: Secondary | ICD-10-CM | POA: Diagnosis not present

## 2022-02-02 DIAGNOSIS — N183 Chronic kidney disease, stage 3 unspecified: Secondary | ICD-10-CM | POA: Diagnosis not present

## 2022-02-02 DIAGNOSIS — K219 Gastro-esophageal reflux disease without esophagitis: Secondary | ICD-10-CM | POA: Diagnosis not present

## 2022-02-08 IMAGING — DX DG CHEST 2V
2 series · 2 of 2 positions shown · non-contrast
Comparison: 07/04/2019

CLINICAL DATA: Chronic cough, asthma, COPD

EXAM:
CHEST - 2 VIEW

[chest pa]
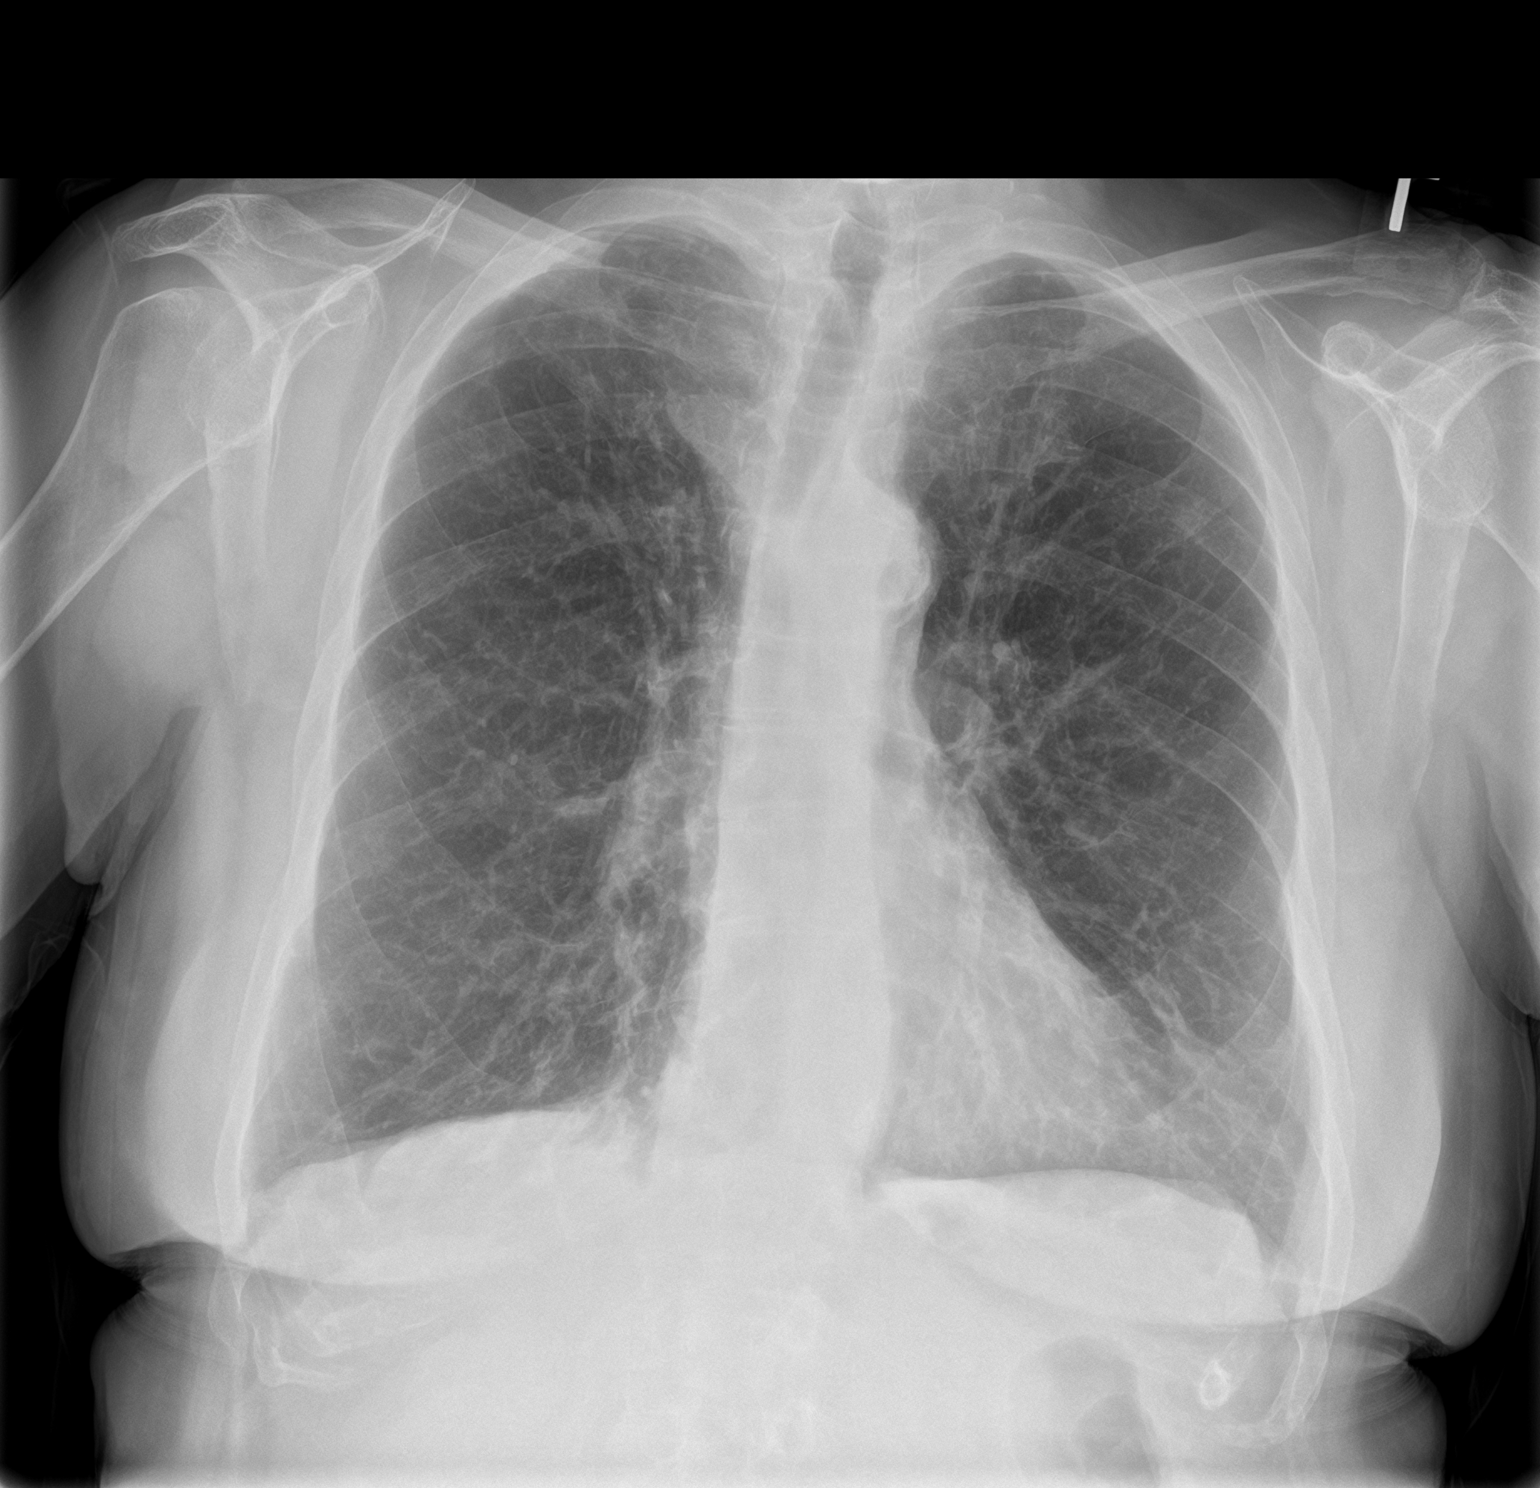

[chest lat]
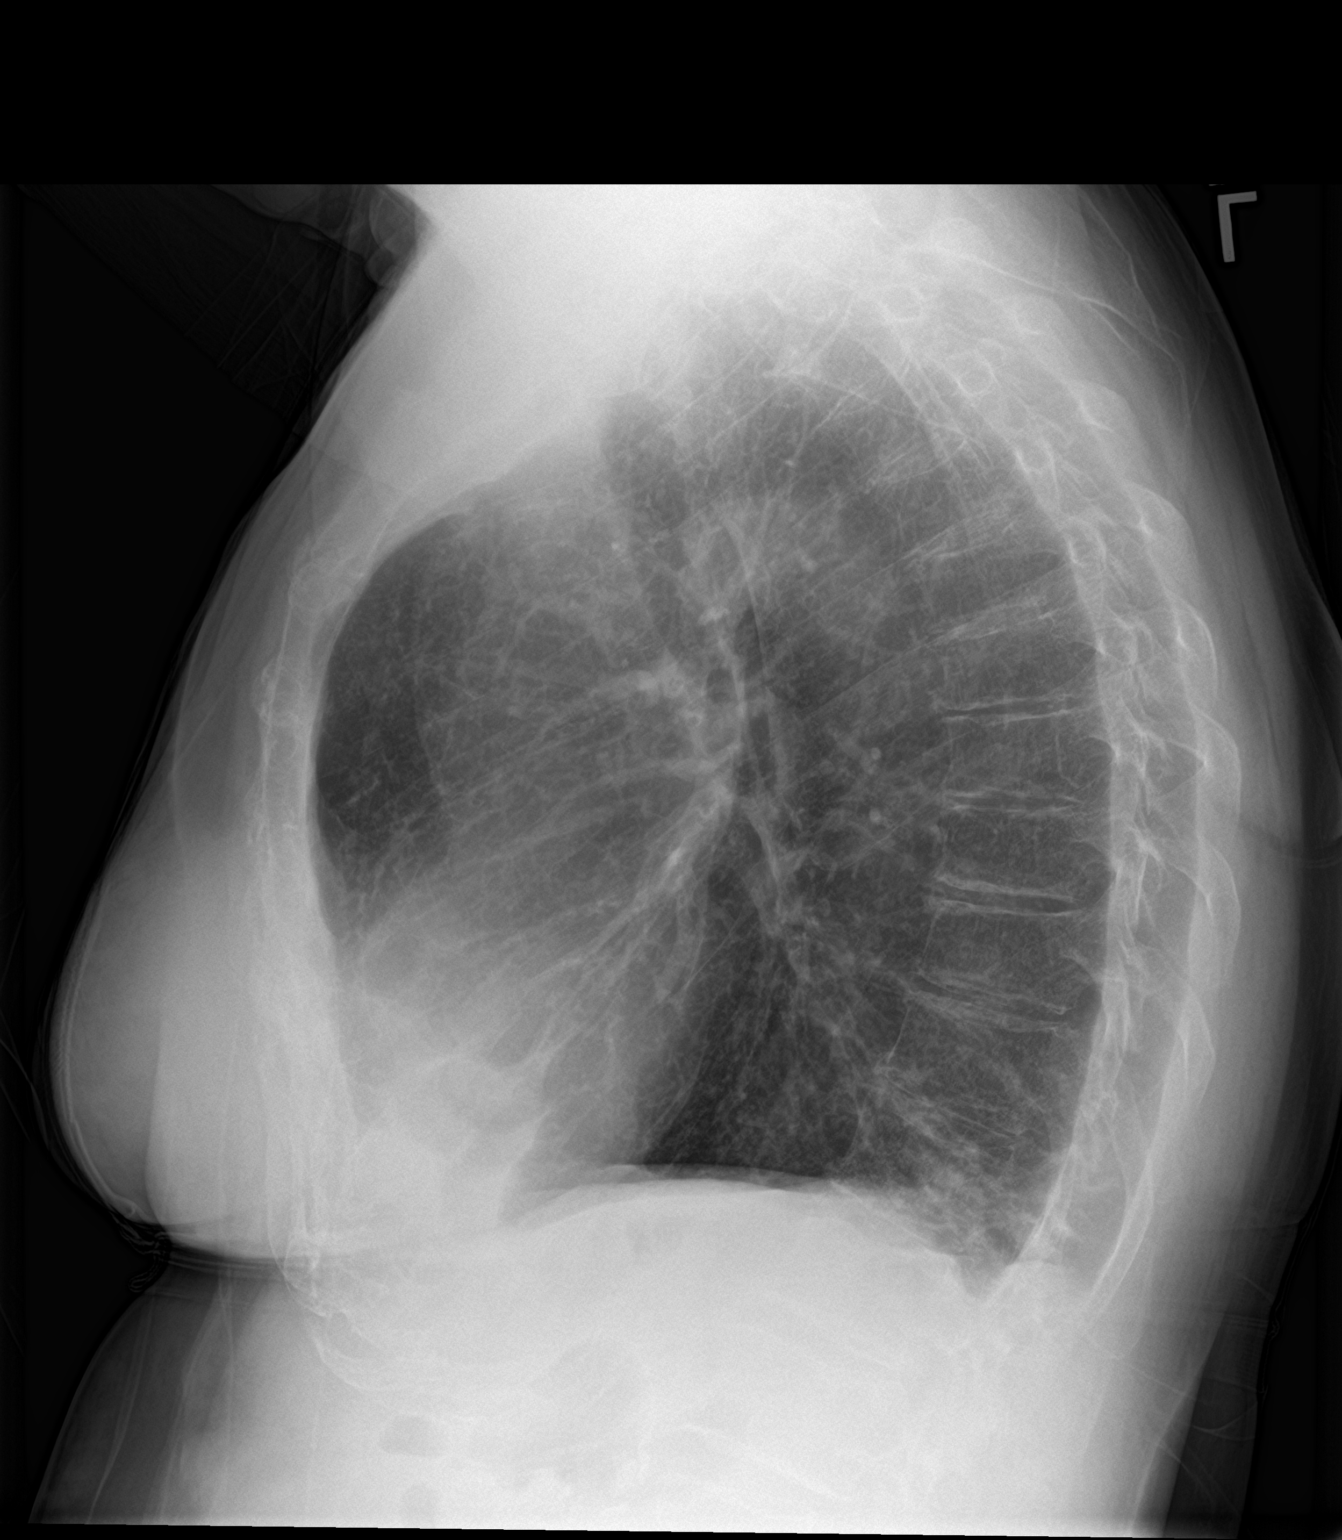

[2 of 2 positions shown; findings below may reference images not displayed]

FINDINGS: Frontal and lateral views of the chest demonstrate an unremarkable
cardiac silhouette. Lungs are hyperinflated with diffuse parenchymal
scarring compatible with emphysema. No airspace disease, effusion,
or pneumothorax. No acute bony abnormalities.
IMPRESSION: 1. Stable emphysema, no acute process.

## 2022-02-09 ENCOUNTER — Encounter (HOSPITAL_COMMUNITY): Payer: Self-pay | Admitting: Hematology

## 2022-02-15 ENCOUNTER — Inpatient Hospital Stay: Payer: Medicare Other

## 2022-02-15 DIAGNOSIS — H5213 Myopia, bilateral: Secondary | ICD-10-CM | POA: Diagnosis not present

## 2022-02-16 ENCOUNTER — Inpatient Hospital Stay: Payer: Medicare Other

## 2022-02-16 ENCOUNTER — Inpatient Hospital Stay: Payer: Medicare Other | Attending: Hematology

## 2022-02-16 VITALS — BP 123/67 | HR 72 | Temp 98.8°F | Resp 18 | Wt 132.0 lb

## 2022-02-16 DIAGNOSIS — C8338 Diffuse large B-cell lymphoma, lymph nodes of multiple sites: Secondary | ICD-10-CM | POA: Diagnosis not present

## 2022-02-16 DIAGNOSIS — D649 Anemia, unspecified: Secondary | ICD-10-CM | POA: Diagnosis not present

## 2022-02-16 DIAGNOSIS — D801 Nonfamilial hypogammaglobulinemia: Secondary | ICD-10-CM | POA: Insufficient documentation

## 2022-02-16 LAB — COMPREHENSIVE METABOLIC PANEL
ALT: 10 U/L (ref 0–44)
AST: 20 U/L (ref 15–41)
Albumin: 3.6 g/dL (ref 3.5–5.0)
Alkaline Phosphatase: 111 U/L (ref 38–126)
Anion gap: 10 (ref 5–15)
BUN: 15 mg/dL (ref 8–23)
CO2: 28 mmol/L (ref 22–32)
Calcium: 8.9 mg/dL (ref 8.9–10.3)
Chloride: 101 mmol/L (ref 98–111)
Creatinine, Ser: 1.09 mg/dL — ABNORMAL HIGH (ref 0.44–1.00)
GFR, Estimated: 56 mL/min — ABNORMAL LOW (ref >60)
Glucose, Bld: 103 mg/dL — ABNORMAL HIGH (ref 70–99)
Potassium: 3.9 mmol/L (ref 3.5–5.1)
Sodium: 139 mmol/L (ref 135–145)
Total Bilirubin: 0.6 mg/dL (ref 0.3–1.2)
Total Protein: 7.3 g/dL (ref 6.5–8.1)

## 2022-02-16 LAB — IRON AND TIBC
Iron: 58 ug/dL (ref 28–170)
Saturation Ratios: 27 % (ref 10.4–31.8)
TIBC: 215 ug/dL — ABNORMAL LOW (ref 250–450)
UIBC: 157 ug/dL

## 2022-02-16 LAB — CBC WITH DIFFERENTIAL/PLATELET
Abs Immature Granulocytes: 0.02 10*3/uL (ref 0.00–0.07)
Basophils Absolute: 0 10*3/uL (ref 0.0–0.1)
Basophils Relative: 1 %
Eosinophils Absolute: 0.3 10*3/uL (ref 0.0–0.5)
Eosinophils Relative: 3 %
HCT: 38.8 % (ref 36.0–46.0)
Hemoglobin: 12.4 g/dL (ref 12.0–15.0)
Immature Granulocytes: 0 %
Lymphocytes Relative: 15 %
Lymphs Abs: 1.2 10*3/uL (ref 0.7–4.0)
MCH: 30.8 pg (ref 26.0–34.0)
MCHC: 32 g/dL (ref 30.0–36.0)
MCV: 96.5 fL (ref 80.0–100.0)
Monocytes Absolute: 0.6 10*3/uL (ref 0.1–1.0)
Monocytes Relative: 8 %
Neutro Abs: 5.8 10*3/uL (ref 1.7–7.7)
Neutrophils Relative %: 73 %
Platelets: 167 10*3/uL (ref 150–400)
RBC: 4.02 MIL/uL (ref 3.87–5.11)
RDW: 12.5 % (ref 11.5–15.5)
WBC: 7.9 10*3/uL (ref 4.0–10.5)
nRBC: 0 % (ref 0.0–0.2)

## 2022-02-16 LAB — FERRITIN: Ferritin: 117 ng/mL (ref 11–307)

## 2022-02-16 LAB — MAGNESIUM: Magnesium: 2 mg/dL (ref 1.7–2.4)

## 2022-02-16 MED ORDER — DEXTROSE 5 % IV SOLN: INTRAVENOUS | Status: DC

## 2022-02-16 MED ORDER — IMMUNE GLOBULIN (HUMAN) 10 GM/100ML IV SOLN
30.0000 g | Freq: Once | INTRAVENOUS | Status: AC
  Administered 2022-02-16: 30 g via INTRAVENOUS
  Filled 2022-02-16: qty 300

## 2022-02-16 NOTE — Progress Notes (Signed)
Patient presents today for IVIG infusion per providers order.  Vital signs within parameters for treatment.  Patient took premedications at home prior to appointment.  Peripheral IV started and blood return noted pre and post infusion.  Stable during infusion without adverse affects.  Vital signs stable.  No complaints at this time.  Discharge from clinic ambulatory in stable condition.  Alert and oriented X 3.  Follow up with [facility] as scheduled.

## 2022-02-16 NOTE — Patient Instructions (Signed)
[facility]  Discharge Instructions: Thank you for choosing [facility] to provide your oncology and hematology care.  If you have a lab appointment with the [facility], please come in thru the Main Entrance and check in at the main information desk.  Wear comfortable clothing and clothing appropriate for easy access to any Portacath or PICC line.   We strive to give you quality time with your provider. You may need to reschedule your appointment if you arrive late (15 or more minutes).  Arriving late affects you and other patients whose appointments are after yours.  Also, if you miss three or more appointments without notifying the office, you may be dismissed from the clinic at the provider's discretion.      For prescription refill requests, have your pharmacy contact our office and allow 72 hours for refills to be completed.    Today you received the following chemotherapy and/or immunotherapy agents IVIG      To help prevent nausea and vomiting after your treatment, we encourage you to take your nausea medication as directed.  BELOW ARE SYMPTOMS THAT SHOULD BE REPORTED IMMEDIATELY: *FEVER GREATER THAN 100.4 F (38 C) OR HIGHER *CHILLS OR SWEATING *NAUSEA AND VOMITING THAT IS NOT CONTROLLED WITH YOUR NAUSEA MEDICATION *UNUSUAL SHORTNESS OF BREATH *UNUSUAL BRUISING OR BLEEDING *URINARY PROBLEMS (pain or burning when urinating, or frequent urination) *BOWEL PROBLEMS (unusual diarrhea, constipation, pain near the anus) TENDERNESS IN MOUTH AND THROAT WITH OR WITHOUT PRESENCE OF ULCERS (sore throat, sores in mouth, or a toothache) UNUSUAL RASH, SWELLING OR PAIN  UNUSUAL VAGINAL DISCHARGE OR ITCHING   Items with * indicate a potential emergency and should be followed up as soon as possible or go to the Emergency Department if any problems should occur.  Please show the CHEMOTHERAPY ALERT CARD or IMMUNOTHERAPY ALERT CARD at check-in to the Emergency  Department and triage nurse.  Should you have questions after your visit or need to cancel or reschedule your appointment, please contact [facility] [phone]  and follow the prompts.  Office hours are 8:00 a.m. to 4:30 p.m. Monday - Friday. Please note that voicemails left after 4:00 p.m. may not be returned until the following business day.  We are closed weekends and major holidays. You have access to a nurse at all times for urgent questions. Please call the main number to the clinic [phone] and follow the prompts.  For any non-urgent questions, you may also contact your provider using MyChart. We now offer e-Visits for anyone 66 and older to request care online for non-urgent symptoms. For details visit [email or web address].   Also download the MyChart app! Go to the app store, search "MyChart", open the app, select [facility], and log in with your MyChart username and password.

## 2022-02-17 LAB — IGG, IGA, IGM
IgA: <5 mg/dL — ABNORMAL LOW (ref 87–352)
IgG (Immunoglobin G), Serum: 1544 mg/dL (ref 586–1602)
IgM (Immunoglobulin M), Srm: 78 mg/dL (ref 26–217)

## 2022-03-15 DIAGNOSIS — L570 Actinic keratosis: Secondary | ICD-10-CM | POA: Diagnosis not present

## 2022-03-15 DIAGNOSIS — Z85828 Personal history of other malignant neoplasm of skin: Secondary | ICD-10-CM | POA: Diagnosis not present

## 2022-04-11 ENCOUNTER — Other Ambulatory Visit: Payer: Self-pay

## 2022-04-11 DIAGNOSIS — D801 Nonfamilial hypogammaglobulinemia: Secondary | ICD-10-CM

## 2022-04-12 ENCOUNTER — Inpatient Hospital Stay: Payer: Medicare Other | Attending: Hematology

## 2022-04-12 DIAGNOSIS — D801 Nonfamilial hypogammaglobulinemia: Secondary | ICD-10-CM | POA: Insufficient documentation

## 2022-04-12 DIAGNOSIS — D649 Anemia, unspecified: Secondary | ICD-10-CM | POA: Insufficient documentation

## 2022-04-12 DIAGNOSIS — C8330 Diffuse large B-cell lymphoma, unspecified site: Secondary | ICD-10-CM | POA: Insufficient documentation

## 2022-04-12 LAB — CBC WITH DIFFERENTIAL/PLATELET
Abs Immature Granulocytes: 0 10*3/uL (ref 0.00–0.07)
Basophils Absolute: 0 10*3/uL (ref 0.0–0.1)
Basophils Relative: 1 %
Eosinophils Absolute: 0.2 10*3/uL (ref 0.0–0.5)
Eosinophils Relative: 4 %
HCT: 38.8 % (ref 36.0–46.0)
Hemoglobin: 12.3 g/dL (ref 12.0–15.0)
Immature Granulocytes: 0 %
Lymphocytes Relative: 29 %
Lymphs Abs: 1.5 10*3/uL (ref 0.7–4.0)
MCH: 30.9 pg (ref 26.0–34.0)
MCHC: 31.7 g/dL (ref 30.0–36.0)
MCV: 97.5 fL (ref 80.0–100.0)
Monocytes Absolute: 0.4 10*3/uL (ref 0.1–1.0)
Monocytes Relative: 9 %
Neutro Abs: 2.9 10*3/uL (ref 1.7–7.7)
Neutrophils Relative %: 57 %
Platelets: 164 10*3/uL (ref 150–400)
RBC: 3.98 MIL/uL (ref 3.87–5.11)
RDW: 12.6 % (ref 11.5–15.5)
WBC: 5 10*3/uL (ref 4.0–10.5)
nRBC: 0 % (ref 0.0–0.2)

## 2022-04-12 LAB — IRON AND TIBC
Iron: 78 ug/dL (ref 28–170)
Saturation Ratios: 40 % — ABNORMAL HIGH (ref 10.4–31.8)
TIBC: 195 ug/dL — ABNORMAL LOW (ref 250–450)
UIBC: 117 ug/dL

## 2022-04-12 LAB — FERRITIN: Ferritin: 78 ng/mL (ref 11–307)

## 2022-04-12 LAB — COMPREHENSIVE METABOLIC PANEL
ALT: 9 U/L (ref 0–44)
AST: 15 U/L (ref 15–41)
Albumin: 3.5 g/dL (ref 3.5–5.0)
Alkaline Phosphatase: 110 U/L (ref 38–126)
Anion gap: 8 (ref 5–15)
BUN: 11 mg/dL (ref 8–23)
CO2: 29 mmol/L (ref 22–32)
Calcium: 8.6 mg/dL — ABNORMAL LOW (ref 8.9–10.3)
Chloride: 102 mmol/L (ref 98–111)
Creatinine, Ser: 0.89 mg/dL (ref 0.44–1.00)
GFR, Estimated: >60 mL/min (ref >60)
Glucose, Bld: 90 mg/dL (ref 70–99)
Potassium: 4.5 mmol/L (ref 3.5–5.1)
Sodium: 139 mmol/L (ref 135–145)
Total Bilirubin: 0.7 mg/dL (ref 0.3–1.2)
Total Protein: 6.8 g/dL (ref 6.5–8.1)

## 2022-04-12 LAB — MAGNESIUM: Magnesium: 2 mg/dL (ref 1.7–2.4)

## 2022-04-13 DIAGNOSIS — M79641 Pain in right hand: Secondary | ICD-10-CM | POA: Diagnosis not present

## 2022-04-13 DIAGNOSIS — F909 Attention-deficit hyperactivity disorder, unspecified type: Secondary | ICD-10-CM | POA: Diagnosis not present

## 2022-04-13 DIAGNOSIS — M79642 Pain in left hand: Secondary | ICD-10-CM | POA: Diagnosis not present

## 2022-04-13 DIAGNOSIS — E785 Hyperlipidemia, unspecified: Secondary | ICD-10-CM | POA: Diagnosis not present

## 2022-04-13 DIAGNOSIS — G47419 Narcolepsy without cataplexy: Secondary | ICD-10-CM | POA: Diagnosis not present

## 2022-04-13 DIAGNOSIS — Z6823 Body mass index (BMI) 23.0-23.9, adult: Secondary | ICD-10-CM | POA: Diagnosis not present

## 2022-04-13 DIAGNOSIS — M654 Radial styloid tenosynovitis [de Quervain]: Secondary | ICD-10-CM | POA: Diagnosis not present

## 2022-04-14 LAB — IGG, IGA, IGM
IgA: <5 mg/dL — ABNORMAL LOW (ref 87–352)
IgG (Immunoglobin G), Serum: 1363 mg/dL (ref 586–1602)
IgM (Immunoglobulin M), Srm: 73 mg/dL (ref 26–217)

## 2022-04-15 ENCOUNTER — Encounter (HOSPITAL_COMMUNITY): Payer: Self-pay | Admitting: Hematology

## 2022-04-18 NOTE — Progress Notes (Signed)
[facility] [address] [place] [address]    Clinic Day:  04/19/2022  Referring physician: Assunta Found, MD  Patient Care Team: Assunta Found, MD as PCP - General (Family Medicine) Jonelle Sidle, MD as PCP - Cardiology (Cardiology) Malissa Hippo, MD as Consulting Physician (Gastroenterology) Lauris Poag, MD as Consulting Physician (Nephrology) Ralene Cork, MD as Consulting Physician (Oncology) Beryle Beams, MD as Consulting Physician (Neurology) Marcelino Duster, MD as Referring Physician (Dermatology) Daisy Lazar, DO (Optometry) Jamison Neighbor Zyiah Christen, MD (Inactive) as Consulting Physician (Pulmonary Disease)   ASSESSMENT & PLAN:   Assessment: 1. Acquired hypogammaglobulinemia: -She has been receiving IVIG every 8 weeks since January 2020. -She does have severely low IgA levels.  However he does not have any reactions.   2.  DLBCL, stage IVb: -She was diagnosed 07/08/2009.  Treated with 4 cycles of R-CHOP with intrathecal methotrexate during cycle 2 and 4.  She developed a Pseudomonas sepsis after cycles 2 and 4 and therapy was stopped after 4 cycles.  She also received 2 years of maintenance rituximab. -Last PET scan on 03/20/2017 did not show any evidence of lymphoma.   3.  Health maintenance: -Mammogram on 11/08/2018, BI-RADS Category 1.  Plan: 1. Acquired hypogammaglobulinemia: - She did not have any infections in the last 4 months.  IgG trough level is 1363. - Labs today: Normal LFTs, creatinine.  CBC was grossly normal. - Continue with IVIG today and every 8 weeks.  RTC 4 months for follow-up with repeat labs.   2.  DLBCL, stage IVb: - She does not have any palpable adenopathy or splenomegaly.  No B symptoms.   3.  Normocytic anemia: - She denies any bleeding per rectum or melena.  She is taking iron tablet daily and tolerates well. - Hemoglobin today is 12.3.  However her ferritin is down to 78 from 117. - Recommend Feraheme 500  mg IV x 1 with next IVIG infusion in 8 weeks.    Orders Placed This Encounter  Procedures   CBC with Differential    Standing Status:   Future    Standing Expiration Date:   04/19/2023   Comprehensive metabolic panel    Standing Status:   Future    Standing Expiration Date:   04/19/2023   Magnesium    Standing Status:   Future    Standing Expiration Date:   04/19/2023   Lactate dehydrogenase    Standing Status:   Future    Standing Expiration Date:   04/19/2023   Iron and TIBC ([facility] ONLY)    Standing Status:   Future    Standing Expiration Date:   04/19/2023   Ferritin    Standing Status:   Future    Standing Expiration Date:   04/19/2023   IgG, IgA, IgM    Standing Status:   Future    Standing Expiration Date:   04/19/2023      I,Alexis Herring,acting as a Neurosurgeon for [employer], MD.,have documented all relevant documentation on the behalf of [name] Massed, MD,as directed by  [name] Massed, MD while in the presence of [name] Massed, MD.   I, [name] Massed MD, have reviewed the above documentation for accuracy and completeness, and I agree with the above.   [name] Massed, MD   4/16/20246:18 PM  CHIEF COMPLAINT:   Diagnosis: acquired hypogammaglobulinemia and DLBCL    Cancer Staging  Non Hodgkin's lymphoma Staging form: Lymphoid Neoplasms, AJCC 6th Edition - Clinical: Stage  IV - Signed by Ellouise Newer, PA on 11/05/2010    Prior Therapy: R-CHOP x 4 cycles with intrathecal methotrexate during cycles 2 & 4   Current Therapy:  IVIG every 8 weeks; intermittent Feraheme last on 12/13/2019    HISTORY OF PRESENT ILLNESS:   Oncology History  Non Hodgkin's lymphoma  09/09/2009 Initial Diagnosis   Non Hodgkin's lymphoma (HCC)      INTERVAL HISTORY:   [name] is a 67 y.o. female presenting to clinic today for follow up of acquired hypogammaglobulinemia and DLBCL. She was last seen by me on 10/19/21.  Today,  she states that she is doing well overall. Her appetite level is at 60%. Her energy level is at 60%.  PAST MEDICAL HISTORY:   Past Medical History: Past Medical History:  Diagnosis Date   Allergic rhinitis    Anxiety    Asthma    B12 deficiency 01/18/2015   Cataract    Cavitary lung disease    Chronic sinusitis    COPD (chronic obstructive pulmonary disease) (HCC)    Depression    Diffuse large B-cell lymphoma (HCC)    DVT (deep venous thrombosis) (HCC) 09/25/2014   Essential hypertension    GERD (gastroesophageal reflux disease)    Hematuria 04/07/2016   History of pneumonia 01/2013   Hyperlipidemia    Hypogammaglobulinemia, acquired (HCC)    Iron deficiency anemia 01/18/2015   Myocardial infarction (HCC) 2011   Narcolepsy without cataplexy(347.00) 01/18/2015   Orofacial dyskinesia 04/22/2015   Osteoporosis    Peripheral neuropathy due to chemotherapy (HCC) 05/02/2016   Sleep apnea    SVC syndrome 09/25/2014    Surgical History: Past Surgical History:  Procedure Laterality Date   ABDOMINAL HYSTERECTOMY     Fibroids   BASAL CELL CARCINOMA EXCISION  03/2011   scalp   BREAST SURGERY Right    biopsy   CATARACT EXTRACTION W/PHACO Left 11/17/2014   Procedure: CATARACT EXTRACTION PHACO AND INTRAOCULAR LENS PLACEMENT LEFT EYE;  Surgeon: Gemma Payor, MD;  Location: [facility];  Service: Ophthalmology;  Laterality: Left;  CDE:5.60   CATARACT EXTRACTION W/PHACO Right 12/15/2014   Procedure: CATARACT EXTRACTION PHACO AND INTRAOCULAR LENS PLACEMENT RIGHT EYE CDE=5.16;  Surgeon: Gemma Payor, MD;  Location: [facility];  Service: Ophthalmology;  Laterality: Right;   COLONOSCOPY N/A 11/14/2012   Procedure: COLONOSCOPY;  Surgeon: Malissa Hippo, MD;  Location: [facility];  Service: Endoscopy;  Laterality: N/A;  830   COLONOSCOPY WITH PROPOFOL N/A 01/12/2018   Procedure: COLONOSCOPY WITH PROPOFOL;  Surgeon: Malissa Hippo, MD;  Location: [facility];  Service: Endoscopy;  Laterality: N/A;  7:30    EYE SURGERY     NASAL SINUS SURGERY     NECK SURGERY     x 2    PERIPHERALLY INSERTED CENTRAL CATHETER INSERTION     PICC Removal     POLYPECTOMY  01/12/2018   Procedure: POLYPECTOMY;  Surgeon: Malissa Hippo, MD;  Location: [facility];  Service: Endoscopy;;  colon   PORT-A-CATH REMOVAL     PORTACATH PLACEMENT  7/11   Removed 6/12   TRACHEOSTOMY     VESICOVAGINAL FISTULA CLOSURE W/ TAH      Social History: Social History   Socioeconomic History   Marital status: Divorced    Spouse name: Not on file   Number of children: 3   Years of education: 12   Highest education level: Not on file  Occupational History   Occupation: Engineer, materials  Comment: [employer]  Tobacco Use   Smoking status: Never   Smokeless tobacco: Never  Vaping Use   Vaping Use: Never used  Substance and Sexual Activity   Alcohol use: No   Drug use: No   Sexual activity: Not on file    Comment: hyst  Other Topics Concern   Not on file  Social History Narrative   Originally from [place]. Always lived in [place]. Prior travel to [place] & [place]. Previously worked in a Physicist, medical as a Producer, television/film/video. Currently works as a Haematologist. She has 1 dog currently. She has 1 conures (small parrots). No mold exposure in her home. 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 No hot tub exposure. She enjoys sewing & quilting.    Social Determinants of Health   Financial Resource Strain: Medium Risk (12/03/2019)   Overall Financial Resource Strain (CARDIA)    Difficulty of Paying Living Expenses: Somewhat hard  Food Insecurity: No Food Insecurity (12/03/2019)   Hunger Vital Sign    Worried About Running Out of Food in the Last Year: Never true    Ran Out of Food in the Last Year: Never true  Transportation Needs: No Transportation Needs (12/03/2019)   PRAPARE - Administrator, Civil Service (Medical): No    Lack of Transportation (Non-Medical): No  Physical Activity: Insufficiently Active  (12/03/2019)   Exercise Vital Sign    Days of Exercise per Week: 2 days    Minutes of Exercise per Session: 10 min  Stress: No Stress Concern Present (12/03/2019)   [employer] of Occupational Health - Occupational Stress Questionnaire    Feeling of Stress : Not at all  Social Connections: Moderately Integrated (12/03/2019)   Social Connection and Isolation Panel [NHANES]    Frequency of Communication with Friends and Family: More than three times a week    Frequency of Social Gatherings with Friends and Family: More than three times a week    Attends Religious Services: More than 4 times per year    Active Member of [employer] or Organizations: Yes    Attends Engineer, structural: More than 4 times per year    Marital Status: Divorced  Intimate Partner Violence: Not At Risk (12/03/2019)   Humiliation, Afraid, Rape, and Kick questionnaire    Fear of Current or Ex-Partner: No    Emotionally Abused: No    Physically Abused: No    Sexually Abused: No    Family History: Family History  Problem Relation Age of Onset   Emphysema Mother    Stroke Mother    COPD Mother    Heart disease Mother        died in sleep  47   Allergies Father    Asthma Father        as a child   Arthritis Father    Parkinson's disease Father 50   Leukemia Maternal Grandmother    Cancer Maternal Grandmother        Leukemia   Diabetes Brother    Heart attack Brother    Hypertension Brother    Heart disease Brother 40       stents   Hypertension Sister    Hypertension Brother     Current Medications:  Current Outpatient Medications:    acetaminophen (TYLENOL) 500 MG tablet, Take 1,000 mg by mouth every 6 (six) hours as needed for moderate pain or headache. , Disp: , Rfl:    acyclovir (  ZOVIRAX) 400 MG tablet, Take 1 tablet (400 mg total) by mouth 2 (two) times daily., Disp: 60 tablet, Rfl: 6   albuterol (PROAIR HFA) 108 (90 Base) MCG/ACT inhaler, Inhale 2 puffs into the lungs every 6 (six)  hours as needed. (Patient taking differently: Inhale 2 puffs into the lungs every 6 (six) hours as needed for wheezing or shortness of breath.), Disp: 8.5 g, Rfl: 2   albuterol (PROVENTIL) (2.5 MG/3ML) 0.083% nebulizer solution, Take 2.5 mg by nebulization every 4 (four) hours as needed for wheezing or shortness of breath., Disp: , Rfl:    amphetamine-dextroamphetamine (ADDERALL) 10 MG tablet, Take 10 mg by mouth every morning. , Disp: , Rfl: 0   Armodafinil 150 MG tablet, Take 150 mg by mouth every morning. , Disp: , Rfl:    aspirin EC 81 MG tablet, Take 81 mg by mouth daily. Swallow whole., Disp: , Rfl:    budesonide-formoterol (SYMBICORT) 160-4.5 MCG/ACT inhaler, Inhale 2 puffs into the lungs 2 (two) times daily., Disp: 1 Inhaler, Rfl: 5   celecoxib (CELEBREX) 200 MG capsule, Take 200 mg by mouth 2 (two) times daily as needed., Disp: , Rfl:    Cholecalciferol (VITAMIN D3 PO), Take 1 capsule by mouth at bedtime., Disp: , Rfl:    citalopram (CELEXA) 10 MG tablet, Take 10 mg by mouth daily., Disp: , Rfl:    cyanocobalamin 1000 MCG tablet, Take 1,000 mcg by mouth daily., Disp: , Rfl:    cyclobenzaprine (FLEXERIL) 5 MG tablet, Take 5 mg by mouth every 8 (eight) hours as needed., Disp: , Rfl:    gabapentin (NEURONTIN) 600 MG tablet, 300 mg 2 (two) times daily. , Disp: , Rfl:    olmesartan (BENICAR) 20 MG tablet, Take 20 mg by mouth daily., Disp: , Rfl:    omeprazole (PRILOSEC) 40 MG capsule, Take 40 mg by mouth daily., Disp: , Rfl:    oxyCODONE-acetaminophen (PERCOCET/ROXICET) 5-325 MG tablet, Take 1 tablet by mouth every 4 (four) hours as needed., Disp: , Rfl:    predniSONE (DELTASONE) 10 MG tablet, Take by mouth., Disp: , Rfl:    simvastatin (ZOCOR) 40 MG tablet, Take 40 mg by mouth daily., Disp: , Rfl:    zafirlukast (ACCOLATE) 20 MG tablet, TAKE 1 TABLET BY MOUTH TWICE DAILY., Disp: 60 tablet, Rfl: 0 No current facility-administered medications for this visit.  Facility-Administered Medications  Ordered in Other Visits:    0.9 %  sodium chloride infusion, , Intravenous, Once, Kefalas, Thomas S, PA-C   0.9 %  sodium chloride infusion, , Intravenous, Once, Kefalas, Thomas S, PA-C   acetaminophen (TYLENOL) tablet 650 mg, 650 mg, Oral, Q6H PRN, Kefalas, Thomas S, PA-C   acetaminophen (TYLENOL) tablet 650 mg, 650 mg, Oral, Q6H PRN, Kefalas, Thomas S, PA-C   dextrose 5 % solution, , Intravenous, Continuous, Kefalas, Maurine Minister, PA-C, Stopped at 08/25/15 1400   dextrose 5 % solution, , Intravenous, Continuous, Kefalas, Thomas S, PA-C, Stopped at 07/13/16 1350   dextrose 5 % solution, , Intravenous, Continuous, Hubbard Hartshorn, NP, Stopped at 02/28/17 1230   dextrose 5 % solution, , Intravenous, Continuous, [name] Massed, MD, Stopped at 04/19/22 1448   diphenhydrAMINE (BENADRYL) capsule 25 mg, 25 mg, Oral, Once, Kefalas, Thomas S, PA-C   diphenhydrAMINE (BENADRYL) capsule 25 mg, 25 mg, Oral, Once, Kefalas, Thomas S, PA-C   sodium chloride 0.9 % injection 10 mL, 10 mL, Intracatheter, PRN, Ellouise Newer, PA-C, 10 mL at 08/25/15 1125   Allergies: Allergies  Allergen Reactions  Meperidine Hcl Anaphylaxis   Montelukast Sodium Hives and Rash    REVIEW OF SYSTEMS:   Review of Systems  Constitutional:  Negative for chills, fatigue and fever.  HENT:   Negative for lump/mass, mouth sores, nosebleeds, sore throat and trouble swallowing.   Eyes:  Negative for eye problems.  Respiratory:  Positive for shortness of breath. Negative for cough.   Cardiovascular:  Positive for palpitations. Negative for chest pain and leg swelling.  Gastrointestinal:  Negative for abdominal pain, constipation, diarrhea, nausea and vomiting.  Genitourinary:  Negative for bladder incontinence, difficulty urinating, dysuria, frequency, hematuria and nocturia.   Musculoskeletal:  Negative for arthralgias, back pain, flank pain, myalgias and neck pain.  Skin:  Negative for itching and rash.  Neurological:   Negative for dizziness, headaches and numbness.  Hematological:  Does not bruise/bleed easily.  Psychiatric/Behavioral:  Negative for depression, sleep disturbance and suicidal ideas. The patient is not nervous/anxious.   All other systems reviewed and are negative.    VITALS:   Blood pressure 121/77, pulse 77, temperature 98.7 F (37.1 C), temperature source Tympanic, resp. rate 18, weight 128 lb 6.4 oz (58.2 kg), SpO2 92 %.  Wt Readings from Last 3 Encounters:  04/19/22 128 lb 6.4 oz (58.2 kg)  02/16/22 132 lb (59.9 kg)  12/14/21 130 lb (59 kg)    Body mass index is 24.26 kg/m.  Performance status (ECOG): 1 - Symptomatic but completely ambulatory  PHYSICAL EXAM:   Physical Exam Vitals and nursing note reviewed. Exam conducted with a chaperone present.  Constitutional:      Appearance: Normal appearance.  Cardiovascular:     Rate and Rhythm: Normal rate and regular rhythm.     Pulses: Normal pulses.     Heart sounds: Normal heart sounds.  Pulmonary:     Effort: Pulmonary effort is normal.     Breath sounds: Normal breath sounds.  Abdominal:     Palpations: Abdomen is soft. There is no hepatomegaly, splenomegaly or mass.     Tenderness: There is no abdominal tenderness.  Musculoskeletal:     Right lower leg: No edema.     Left lower leg: No edema.  Lymphadenopathy:     Cervical: No cervical adenopathy.     Right cervical: No superficial, deep or posterior cervical adenopathy.    Left cervical: No superficial, deep or posterior cervical adenopathy.     Upper Body:     Right upper body: No supraclavicular or axillary adenopathy.     Left upper body: No supraclavicular or axillary adenopathy.  Neurological:     General: No focal deficit present.     Mental Status: She is alert and oriented to person, place, and time.  Psychiatric:        Mood and Affect: Mood normal.        Behavior: Behavior normal.     LABS:      Latest Ref Rng & Units 04/12/2022    8:58 AM  02/16/2022    7:58 AM 12/14/2021    8:00 AM  CBC  WBC 4.0 - 10.5 K/uL 5.0  7.9  6.4   Hemoglobin 12.0 - 15.0 g/dL 60.4  54.0  98.1   Hematocrit 36.0 - 46.0 % 38.8  38.8  39.5   Platelets 150 - 400 K/uL 164  167  179       Latest Ref Rng & Units 04/12/2022    8:58 AM 02/16/2022    7:58 AM 12/14/2021    8:00 AM  CMP  Glucose 70 - 99 mg/dL 90  161  096   BUN 8 - 23 mg/dL 11  15  15    Creatinine 0.44 - 1.00 mg/dL 0.45  4.09  8.11   Sodium 135 - 145 mmol/L 139  139  139   Potassium 3.5 - 5.1 mmol/L 4.5  3.9  4.0   Chloride 98 - 111 mmol/L 102  101  102   CO2 22 - 32 mmol/L 29  28  28    Calcium 8.9 - 10.3 mg/dL 8.6  8.9  8.9   Total Protein 6.5 - 8.1 g/dL 6.8  7.3  7.2   Total Bilirubin 0.3 - 1.2 mg/dL 0.7  0.6  0.5   Alkaline Phos 38 - 126 U/L 110  111  100   AST 15 - 41 U/L 15  20  18    ALT 0 - 44 U/L 9  10  9       No results found for: "CEA1", "CEA" / No results found for: "CEA1", "CEA" No results found for: "PSA1" No results found for: "BJY782" No results found for: "CAN125"  Lab Results  Component Value Date   TOTALPROTELP 6.3 02/11/2014   TOTALPROTELP 6.3 02/11/2014   ALBUMINELP 57.6 02/11/2014   A1GS 6.1 (H) 02/11/2014   A2GS 11.7 02/11/2014   BETS 6.4 02/11/2014   BETA2SER 5.3 02/11/2014   GAMS 12.9 02/11/2014   MSPIKE NOT DETECTED 02/11/2014   SPEI (NOTE) 02/11/2014   Lab Results  Component Value Date   TIBC 195 (L) 04/12/2022   TIBC 215 (L) 02/16/2022   TIBC 222 (L) 12/14/2021   FERRITIN 78 04/12/2022   FERRITIN 117 02/16/2022   FERRITIN 101 12/14/2021   IRONPCTSAT 40 (H) 04/12/2022   IRONPCTSAT 27 02/16/2022   IRONPCTSAT 33 (H) 12/14/2021   Lab Results  Component Value Date   LDH 151 04/26/2021   LDH 186 11/02/2020   LDH 164 07/13/2020     STUDIES:   No results found.

## 2022-04-19 ENCOUNTER — Inpatient Hospital Stay: Payer: Medicare Other

## 2022-04-19 ENCOUNTER — Inpatient Hospital Stay (HOSPITAL_BASED_OUTPATIENT_CLINIC_OR_DEPARTMENT_OTHER): Payer: Medicare Other | Admitting: Hematology

## 2022-04-19 VITALS — BP 135/78 | HR 69 | Temp 97.5°F | Resp 18

## 2022-04-19 VITALS — BP 121/77 | HR 77 | Temp 98.7°F | Resp 18 | Wt 128.4 lb

## 2022-04-19 DIAGNOSIS — D649 Anemia, unspecified: Secondary | ICD-10-CM | POA: Diagnosis not present

## 2022-04-19 DIAGNOSIS — D508 Other iron deficiency anemias: Secondary | ICD-10-CM

## 2022-04-19 DIAGNOSIS — C8338 Diffuse large B-cell lymphoma, lymph nodes of multiple sites: Secondary | ICD-10-CM | POA: Diagnosis not present

## 2022-04-19 DIAGNOSIS — D801 Nonfamilial hypogammaglobulinemia: Secondary | ICD-10-CM | POA: Diagnosis not present

## 2022-04-19 DIAGNOSIS — C8330 Diffuse large B-cell lymphoma, unspecified site: Secondary | ICD-10-CM | POA: Diagnosis not present

## 2022-04-19 MED ORDER — IMMUNE GLOBULIN (HUMAN) 10 GM/100ML IV SOLN
30.0000 g | Freq: Once | INTRAVENOUS | Status: AC
  Administered 2022-04-19: 30 g via INTRAVENOUS
  Filled 2022-04-19: qty 300

## 2022-04-19 MED ORDER — DEXTROSE 5 % IV SOLN: INTRAVENOUS | Status: DC

## 2022-04-19 NOTE — Patient Instructions (Signed)
[facility] at [facility] Discharge Instructions   You were seen and examined today by Dr. Katragadda.  He reviewed the results of your lab work which are normal/stable.   We will proceed with your treatment today.  Return as scheduled.    Thank you for choosing [facility] at [facility] to provide your oncology and hematology care.  To afford each patient quality time with our provider, please arrive at least 15 minutes before your scheduled appointment time.   If you have a lab appointment with the Cancer Center please come in thru the Main Entrance and check in at the main information desk.  You need to re-schedule your appointment should you arrive 10 or more minutes late.  We strive to give you quality time with our providers, and arriving late affects you and other patients whose appointments are after yours.  Also, if you no show three or more times for appointments you may be dismissed from the clinic at the providers discretion.     Again, thank you for choosing [facility].  Our hope is that these requests will decrease the amount of time that you wait before being seen by our physicians.       _____________________________________________________________  Should you have questions after your visit to [facility], please contact our office at [phone] and follow the prompts.  Our office hours are 8:00 a.m. and 4:30 p.m. Monday - Friday.  Please note that voicemails left after 4:00 p.m. may not be returned until the following business day.  We are closed weekends and major holidays.  You do have access to a nurse 24-7, just call the main number to the clinic [phone] and do not press any options, hold on the line and a nurse will answer the phone.    For prescription refill requests, have your pharmacy contact our office and allow 72 hours.    Due to Covid, you will need to wear a mask upon entering  the hospital. If you do not have a mask, a mask will be given to you at the Main Entrance upon arrival. For doctor visits, patients may have 1 support person age 18 or older with them. For treatment visits, patients can not have anyone with them due to social distancing guidelines and our immunocompromised population.      

## 2022-04-19 NOTE — Patient Instructions (Signed)
[facility]  Discharge Instructions: Thank you for choosing  Cancer Center to provide your oncology and hematology care.  If you have a lab appointment with the Cancer Center - please note that after April 8th, 2024, all labs will be drawn in the cancer center.  You do not have to check in or register with the main entrance as you have in the past but will complete your check-in in the cancer center.  Wear comfortable clothing and clothing appropriate for easy access to any Portacath or PICC line.   We strive to give you quality time with your provider. You may need to reschedule your appointment if you arrive late (15 or more minutes).  Arriving late affects you and other patients whose appointments are after yours.  Also, if you miss three or more appointments without notifying the office, you may be dismissed from the clinic at the provider's discretion.      For prescription refill requests, have your pharmacy contact our office and allow 72 hours for refills to be completed.    Today you received the following IVIG.  Immune Globulin Injection What is this medication? IMMUNE GLOBULIN (im MUNE  GLOB yoo lin) helps to prevent or reduce the severity of certain infections in patients who are at risk. This medicine is collected from the pooled blood of many donors. It is used to treat immune system problems, thrombocytopenia, and Kawasaki syndrome. This medicine may be used for other purposes; ask your health care provider or pharmacist if you have questions. This medicine may be used for other purposes; ask your health care provider or pharmacist if you have questions. COMMON BRAND NAME(S): ASCENIV, Baygam, BIVIGAM, Carimune, Carimune NF, cutaquig, Cuvitru, Flebogamma, Flebogamma DIF, GamaSTAN, GamaSTAN S/D, Gamimune N, Gammagard, Gammagard S/D, Gammaked, Gammaplex, Gammar-P IV, Gamunex, Gamunex-C, Hizentra, Iveegam, Iveegam EN, Octagam, Panglobulin, Panglobulin NF,  panzyga, Polygam S/D, Privigen, Sandoglobulin, Venoglobulin-S, Vigam, Vivaglobulin, Xembify What should I tell my care team before I take this medication? They need to know if you have any of these conditions: diabetes extremely low or no immune antibodies in the blood heart disease history of blood clots hyperprolinemia infection in the blood, sepsis kidney disease recently received or scheduled to receive a vaccination an unusual or allergic reaction to human immune globulin, albumin, maltose, sucrose, other medicines, foods, dyes, or preservatives pregnant or trying to get pregnant breast-feeding How should I use this medication? This medicine is for injection into a muscle or infusion into a vein or skin. It is usually given by a health care professional in a hospital or clinic setting. In rare cases, some brands of this medicine might be given at home. You will be taught how to give this medicine. Use exactly as directed. Take your medicine at regular intervals. Do not take your medicine more often than directed. Talk to your pediatrician regarding the use of this medicine in children. While this drug may be prescribed for selected conditions, precautions do apply. Overdosage: If you think you have taken too much of this medicine contact a poison control center or emergency room at once. NOTE: This medicine is only for you. Do not share this medicine with others. Overdosage: If you think you have taken too much of this medicine contact a poison control center or emergency room at once. NOTE: This medicine is only for you. Do not share this medicine with others. What if I miss a dose? It is important not to miss your dose. Call  your doctor or health care professional if you are unable to keep an appointment. If you give yourself the medicine and you miss a dose, take it as soon as you can. If it is almost time for your next dose, take only that dose. Do not take double or extra  doses. What may interact with this medication? aspirin and aspirin-like medicines cisplatin cyclosporine medicines for infection like acyclovir, adefovir, amphotericin B, bacitracin, cidofovir, foscarnet, ganciclovir, gentamicin, pentamidine, vancomycin NSAIDS, medicines for pain and inflammation, like ibuprofen or naproxen pamidronate vaccines zoledronic acid This list may not describe all possible interactions. Give your health care provider a list of all the medicines, herbs, non-prescription drugs, or dietary supplements you use. Also tell them if you smoke, drink alcohol, or use illegal drugs. Some items may interact with your medicine. This list may not describe all possible interactions. Give your health care provider a list of all the medicines, herbs, non-prescription drugs, or dietary supplements you use. Also tell them if you smoke, drink alcohol, or use illegal drugs. Some items may interact with your medicine. What should I watch for while using this medication? Your condition will be monitored carefully while you are receiving this medicine. This medicine is made from pooled blood donations of many different people. It may be possible to pass an infection in this medicine. However, the donors are screened for infections and all products are tested for HIV and hepatitis. The medicine is treated to kill most or all bacteria and viruses. Talk to your doctor about the risks and benefits of this medicine. Do not have vaccinations for at least 14 days before, or until at least 3 months after receiving this medicine. What side effects may I notice from receiving this medication? Side effects that you should report to your doctor or health care professional as soon as possible: allergic reactions like skin rash, itching or hives, swelling of the face, lips, or tongue blue colored lips or skin breathing problems chest pain or tightness fever signs and symptoms of aseptic meningitis such  as stiff neck; sensitivity to light; headache; drowsiness; fever; nausea; vomiting; rash signs and symptoms of a blood clot such as chest pain; shortness of breath; pain, swelling, or warmth in the leg signs and symptoms of hemolytic anemia such as fast heartbeat; tiredness; dark yellow or brown urine; or yellowing of the eyes or skin signs and symptoms of kidney injury like trouble passing urine or change in the amount of urine sudden weight gain swelling of the ankles, feet, hands Side effects that usually do not require medical attention (report to your doctor or health care professional if they continue or are bothersome): diarrhea flushing headache increased sweating joint pain muscle cramps muscle pain nausea pain, redness, or irritation at site where injected tiredness This list may not describe all possible side effects. Call your doctor for medical advice about side effects. You may report side effects to FDA at 1-800-FDA-1088. This list may not describe all possible side effects. Call your doctor for medical advice about side effects. You may report side effects to FDA at 1-800-FDA-1088. Where should I keep my medication? Keep out of the reach of children. This drug is usually given in a hospital or clinic and will not be stored at home. In rare cases, some brands of this medicine may be given at home. If you are using this medicine at home, you will be instructed on how to store this medicine. Throw away any unused medicine after the  expiration date on the label. NOTE: This sheet is a summary. It may not cover all possible information. If you have questions about this medicine, talk to your doctor, pharmacist, or health care provider.  2023 Elsevier/Gold Standard (2008-03-13 00:00:00)    To help prevent nausea and vomiting after your treatment, we encourage you to take your nausea medication as directed.  BELOW ARE SYMPTOMS THAT SHOULD BE REPORTED IMMEDIATELY: *FEVER GREATER  THAN 100.4 F (38 C) OR HIGHER *CHILLS OR SWEATING *NAUSEA AND VOMITING THAT IS NOT CONTROLLED WITH YOUR NAUSEA MEDICATION *UNUSUAL SHORTNESS OF BREATH *UNUSUAL BRUISING OR BLEEDING *URINARY PROBLEMS (pain or burning when urinating, or frequent urination) *BOWEL PROBLEMS (unusual diarrhea, constipation, pain near the anus) TENDERNESS IN MOUTH AND THROAT WITH OR WITHOUT PRESENCE OF ULCERS (sore throat, sores in mouth, or a toothache) UNUSUAL RASH, SWELLING OR PAIN  UNUSUAL VAGINAL DISCHARGE OR ITCHING   Items with * indicate a potential emergency and should be followed up as soon as possible or go to the Emergency Department if any problems should occur.  Please show the CHEMOTHERAPY ALERT CARD or IMMUNOTHERAPY ALERT CARD at check-in to the Emergency Department and triage nurse.  Should you have questions after your visit or need to cancel or reschedule your appointment, please contact [facility] [phone]  and follow the prompts.  Office hours are 8:00 a.m. to 4:30 p.m. Monday - Friday. Please note that voicemails left after 4:00 p.m. may not be returned until the following business day.  We are closed weekends and major holidays. You have access to a nurse at all times for urgent questions. Please call the main number to the clinic [phone] and follow the prompts.  For any non-urgent questions, you may also contact your provider using MyChart. We now offer e-Visits for anyone 53 and older to request care online for non-urgent symptoms. For details visit [email or web address].   Also download the MyChart app! Go to the app store, search "MyChart", open the app, select [place], and log in with your MyChart username and password.

## 2022-04-19 NOTE — Progress Notes (Signed)
Patient presents today for IVIG infusion.  Patient is in satisfactory condition with no new complaints voiced.  Vital signs are stable. Patient took premeds of tylenol and benadryl at home . Patient seen Dr Ellin Saba. No changes made in treatment plan. We will proceed with infusion per provider orders.    Patient tolerated treatment well with no complaints voiced.  Patient left ambulatory in stable condition.  Vital signs stable at discharge.  Follow up as scheduled.

## 2022-04-20 ENCOUNTER — Telehealth: Payer: Self-pay | Admitting: Cardiology

## 2022-04-20 DIAGNOSIS — I350 Nonrheumatic aortic (valve) stenosis: Secondary | ICD-10-CM

## 2022-04-20 NOTE — Telephone Encounter (Signed)
Pt called to schedule echo. Msg sent to front desk to call and schedule.

## 2022-04-20 NOTE — Telephone Encounter (Signed)
Patient states that she has had calls requesting her to schedule echo test. Requesting call back to discuss.

## 2022-04-29 ENCOUNTER — Other Ambulatory Visit (HOSPITAL_COMMUNITY): Payer: Self-pay | Admitting: Family Medicine

## 2022-04-29 DIAGNOSIS — Z1231 Encounter for screening mammogram for malignant neoplasm of breast: Secondary | ICD-10-CM

## 2022-05-12 ENCOUNTER — Ambulatory Visit (HOSPITAL_COMMUNITY)
Admission: RE | Admit: 2022-05-12 | Discharge: 2022-05-12 | Disposition: A | Discharge status: Home / Self Care | Payer: Medicare Other | Source: Ambulatory Visit | Attending: Family Medicine | Admitting: Family Medicine

## 2022-05-12 DIAGNOSIS — Z1231 Encounter for screening mammogram for malignant neoplasm of breast: Secondary | ICD-10-CM | POA: Insufficient documentation

## 2022-05-17 DIAGNOSIS — M4312 Spondylolisthesis, cervical region: Secondary | ICD-10-CM | POA: Diagnosis not present

## 2022-05-17 DIAGNOSIS — Z6825 Body mass index (BMI) 25.0-25.9, adult: Secondary | ICD-10-CM | POA: Diagnosis not present

## 2022-05-25 ENCOUNTER — Ambulatory Visit (HOSPITAL_COMMUNITY)
Admission: RE | Admit: 2022-05-25 | Discharge: 2022-05-25 | Disposition: A | Discharge status: Home / Self Care | Payer: Medicare Other | Source: Ambulatory Visit | Attending: Student | Admitting: Student

## 2022-05-25 DIAGNOSIS — I350 Nonrheumatic aortic (valve) stenosis: Secondary | ICD-10-CM | POA: Diagnosis not present

## 2022-05-25 LAB — ECHOCARDIOGRAM COMPLETE
AR max vel: 1.18 cm2
AV Area VTI: 1.28 cm2
AV Area mean vel: 1.18 cm2
AV Mean grad: 11 mmHg
AV Peak grad: 19.2 mmHg
Ao pk vel: 2.19 m/s
Area-P 1/2: 3.31 cm2
S' Lateral: 2.2 cm

## 2022-05-25 NOTE — Progress Notes (Signed)
*  PRELIMINARY RESULTS* Echocardiogram 2D Echocardiogram has been performed.  Stacey Drain 05/25/2022, 11:11 AM

## 2022-05-31 IMAGING — US US EXTREM LOW VENOUS*R*
1 series · 14 of 24 positions shown · non-contrast
Comparison: None.

CLINICAL DATA: Right lower extremity swelling for the past 3 weeks

EXAM:
RIGHT LOWER EXTREMITY VENOUS DOPPLER ULTRASOUND
TECHNIQUE: Gray-scale sonography with compression, as well as color and duplex
ultrasound, were performed to evaluate the deep venous system(s)
from the level of the common femoral vein through the popliteal and
proximal calf veins.

[Series 1: us extrem low venous*right* · 0.07mm/px · 14 of 55 slices shown]
[im 1/55]
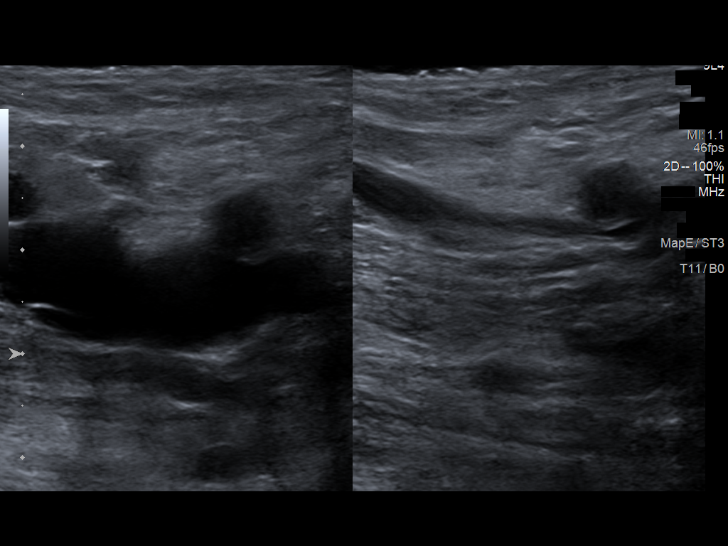
[im 5/55]
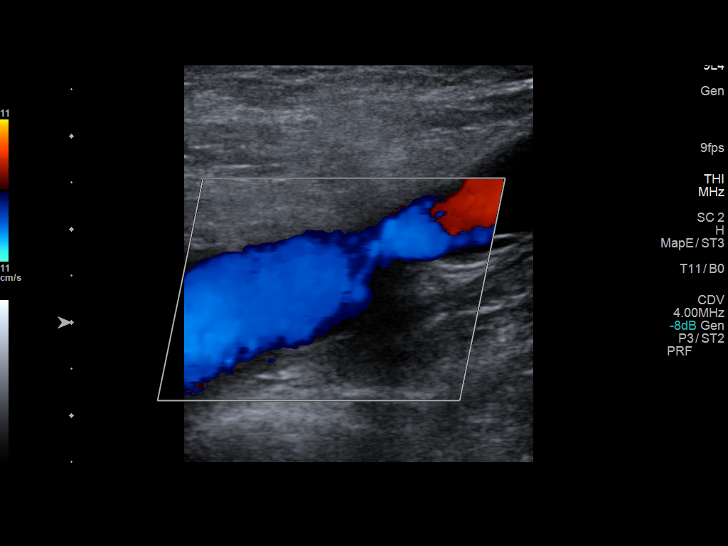
[im 10/55]
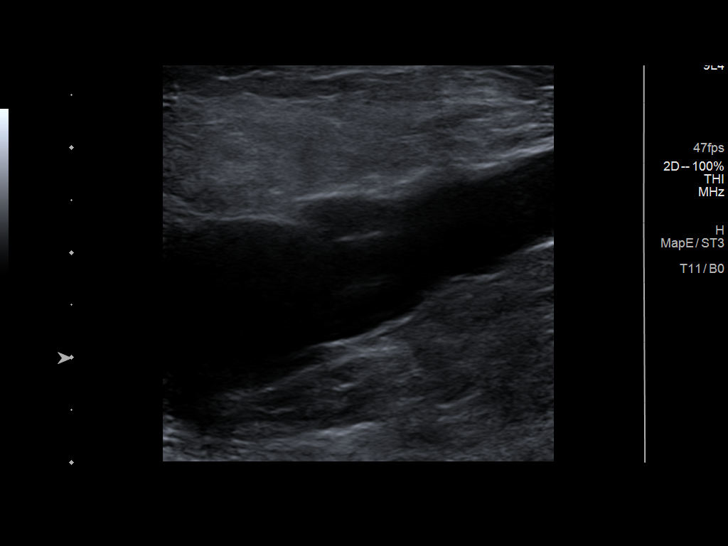
[im 15/55]
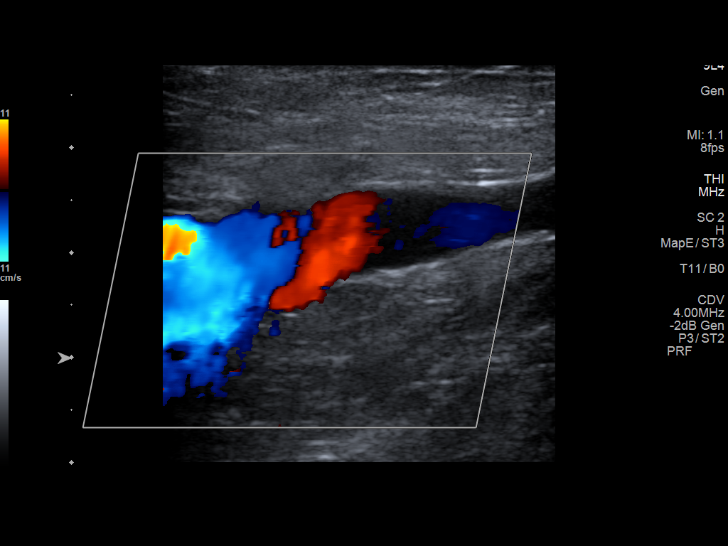
[im 17/55]
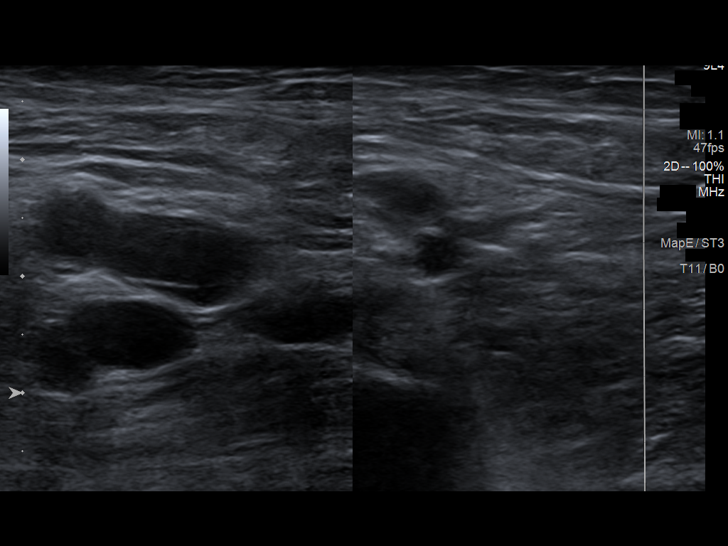
[im 22/55]
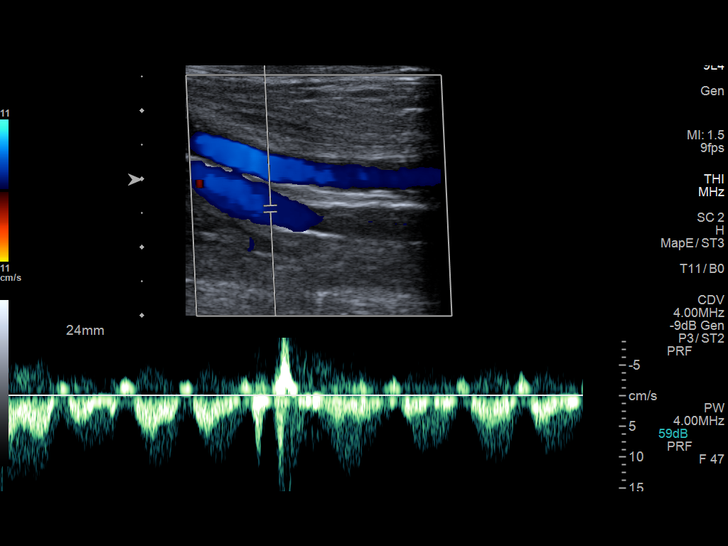
[im 26/55]
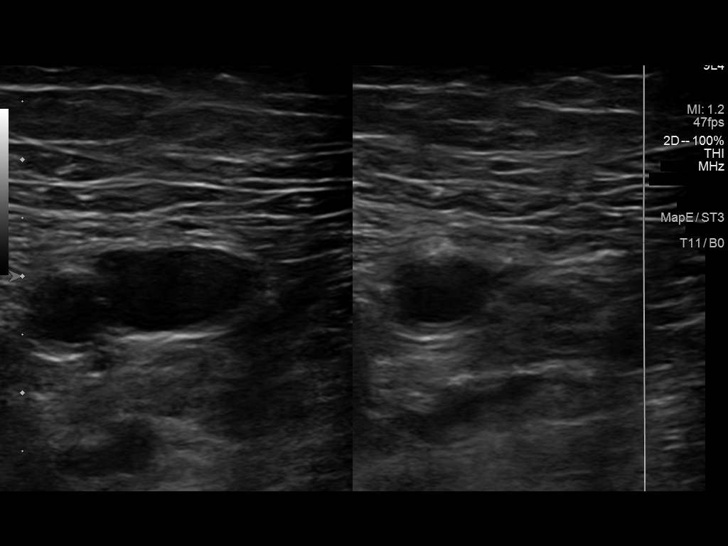
[im 29/55]
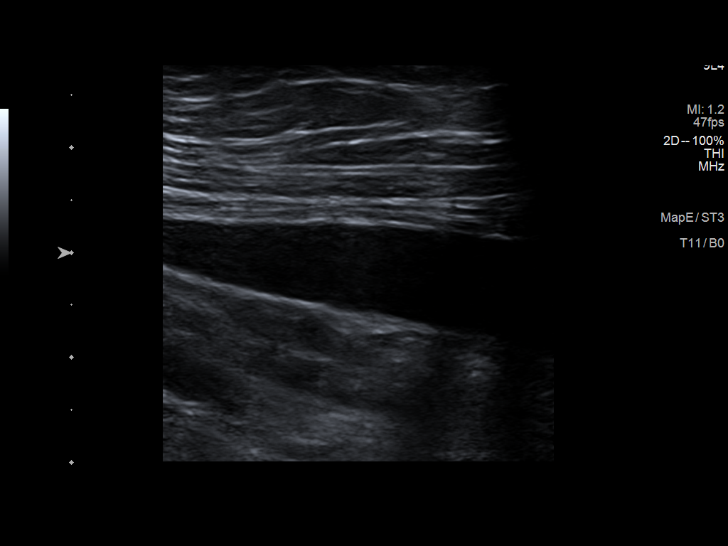
[im 33/55]
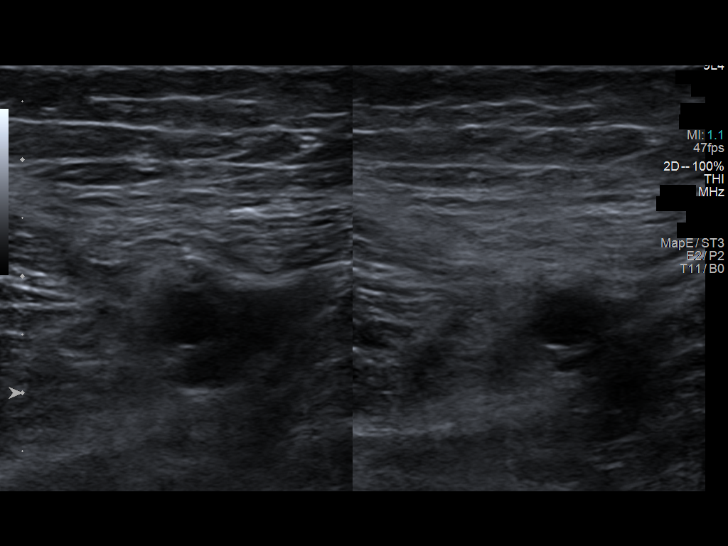
[im 38/55]
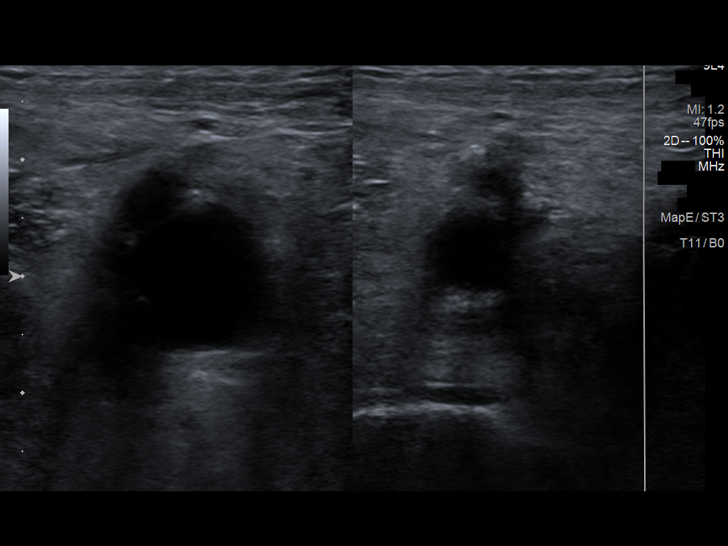
[im 43/55]
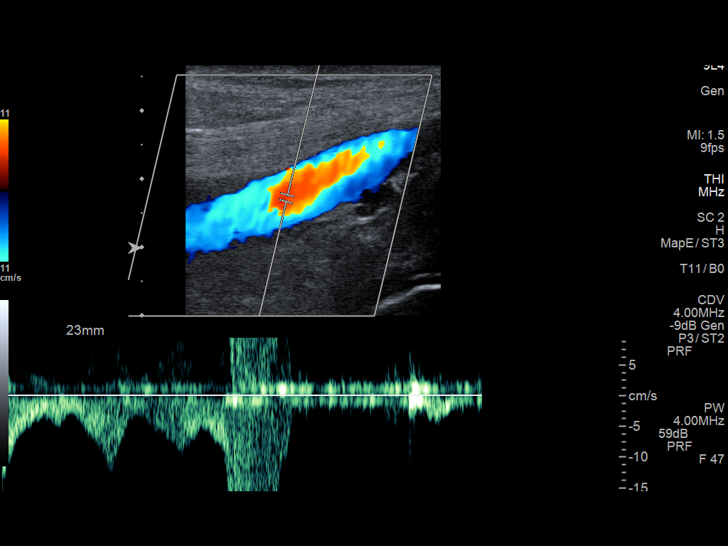
[im 45/55]
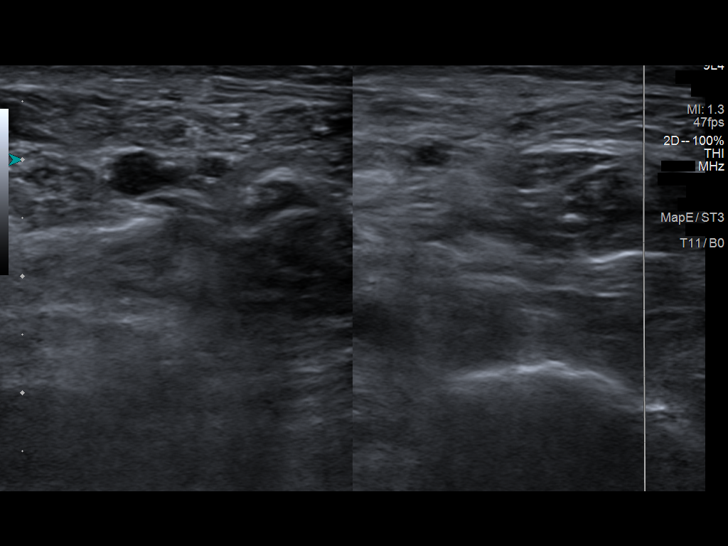
[im 50/55]
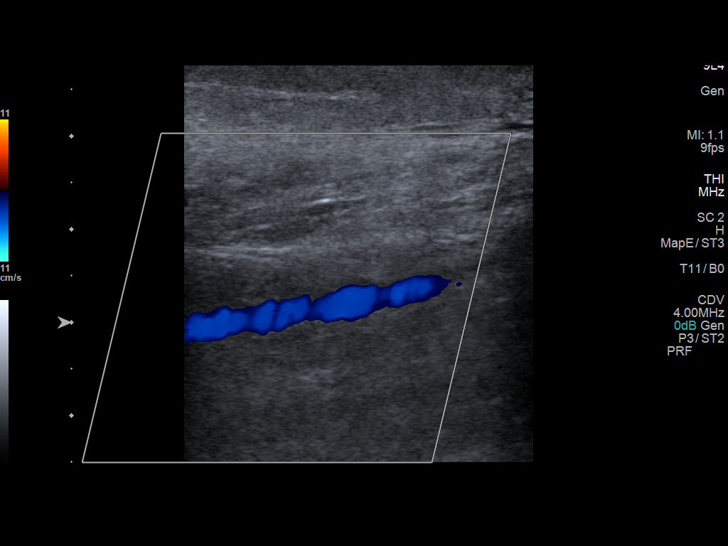
[im 55/55]
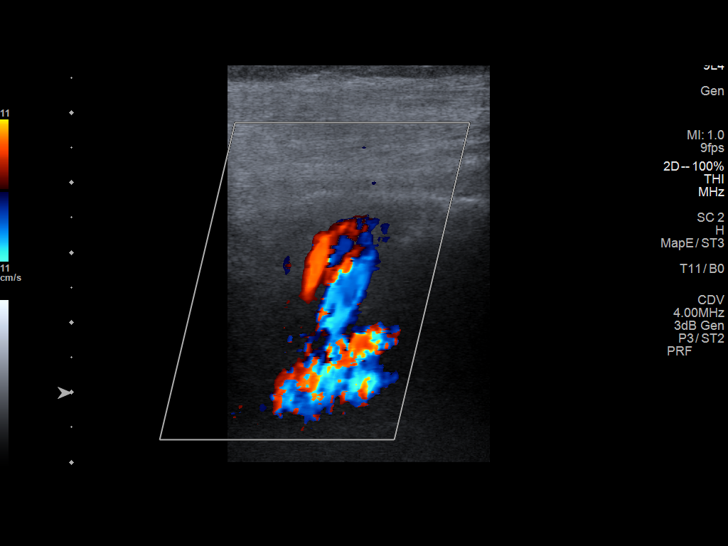

[14 of 24 positions shown; findings below may reference images not displayed]

FINDINGS: VENOUS

Normal compressibility of the common femoral, superficial femoral,
and popliteal veins, as well as the visualized calf veins.
Visualized portions of profunda femoral vein and great saphenous
vein unremarkable. No filling defects to suggest DVT on grayscale or
color Doppler imaging. Doppler waveforms show normal direction of
venous flow, normal respiratory plasticity and response to
augmentation.

Limited views of the contralateral common femoral vein are
unremarkable.

OTHER

None.

Limitations: none
IMPRESSION: Negative.

## 2022-06-13 ENCOUNTER — Other Ambulatory Visit: Payer: Self-pay

## 2022-06-13 DIAGNOSIS — C8338 Diffuse large B-cell lymphoma, lymph nodes of multiple sites: Secondary | ICD-10-CM

## 2022-06-14 ENCOUNTER — Inpatient Hospital Stay: Payer: Medicare Other | Attending: Hematology

## 2022-06-14 ENCOUNTER — Inpatient Hospital Stay: Payer: Medicare Other

## 2022-06-14 VITALS — BP 135/70 | HR 65 | Temp 97.8°F | Resp 17 | Wt 126.2 lb

## 2022-06-14 DIAGNOSIS — C8330 Diffuse large B-cell lymphoma, unspecified site: Secondary | ICD-10-CM | POA: Diagnosis not present

## 2022-06-14 DIAGNOSIS — D509 Iron deficiency anemia, unspecified: Secondary | ICD-10-CM | POA: Diagnosis not present

## 2022-06-14 DIAGNOSIS — D801 Nonfamilial hypogammaglobulinemia: Secondary | ICD-10-CM | POA: Diagnosis not present

## 2022-06-14 DIAGNOSIS — C8338 Diffuse large B-cell lymphoma, lymph nodes of multiple sites: Secondary | ICD-10-CM

## 2022-06-14 LAB — COMPREHENSIVE METABOLIC PANEL
ALT: 13 U/L (ref 0–44)
AST: 20 U/L (ref 15–41)
Albumin: 3.5 g/dL (ref 3.5–5.0)
Alkaline Phosphatase: 112 U/L (ref 38–126)
Anion gap: 12 (ref 5–15)
BUN: 16 mg/dL (ref 8–23)
CO2: 26 mmol/L (ref 22–32)
Calcium: 8.7 mg/dL — ABNORMAL LOW (ref 8.9–10.3)
Chloride: 98 mmol/L (ref 98–111)
Creatinine, Ser: 1.05 mg/dL — ABNORMAL HIGH (ref 0.44–1.00)
GFR, Estimated: 58 mL/min — ABNORMAL LOW (ref >60)
Glucose, Bld: 113 mg/dL — ABNORMAL HIGH (ref 70–99)
Potassium: 3.7 mmol/L (ref 3.5–5.1)
Sodium: 136 mmol/L (ref 135–145)
Total Bilirubin: 0.6 mg/dL (ref 0.3–1.2)
Total Protein: 7.4 g/dL (ref 6.5–8.1)

## 2022-06-14 LAB — CBC WITH DIFFERENTIAL/PLATELET
Abs Immature Granulocytes: 0.03 10*3/uL (ref 0.00–0.07)
Basophils Absolute: 0.1 10*3/uL (ref 0.0–0.1)
Basophils Relative: 1 %
Eosinophils Absolute: 0.4 10*3/uL (ref 0.0–0.5)
Eosinophils Relative: 4 %
HCT: 37.4 % (ref 36.0–46.0)
Hemoglobin: 11.9 g/dL — ABNORMAL LOW (ref 12.0–15.0)
Immature Granulocytes: 0 %
Lymphocytes Relative: 19 %
Lymphs Abs: 1.6 10*3/uL (ref 0.7–4.0)
MCH: 30.9 pg (ref 26.0–34.0)
MCHC: 31.8 g/dL (ref 30.0–36.0)
MCV: 97.1 fL (ref 80.0–100.0)
Monocytes Absolute: 0.7 10*3/uL (ref 0.1–1.0)
Monocytes Relative: 8 %
Neutro Abs: 5.6 10*3/uL (ref 1.7–7.7)
Neutrophils Relative %: 68 %
Platelets: 167 10*3/uL (ref 150–400)
RBC: 3.85 MIL/uL — ABNORMAL LOW (ref 3.87–5.11)
RDW: 12.9 % (ref 11.5–15.5)
WBC: 8.3 10*3/uL (ref 4.0–10.5)
nRBC: 0 % (ref 0.0–0.2)

## 2022-06-14 LAB — MAGNESIUM: Magnesium: 2 mg/dL (ref 1.7–2.4)

## 2022-06-14 MED ORDER — ACETAMINOPHEN 325 MG PO TABS: 650.0000 mg | ORAL_TABLET | Freq: Once | ORAL | Status: DC

## 2022-06-14 MED ORDER — SODIUM CHLORIDE 0.9 % IV SOLN
510.0000 mg | Freq: Once | INTRAVENOUS | Status: AC
  Administered 2022-06-14: 510 mg via INTRAVENOUS
  Filled 2022-06-14: qty 510

## 2022-06-14 MED ORDER — DIPHENHYDRAMINE HCL 25 MG PO CAPS: 25.0000 mg | ORAL_CAPSULE | Freq: Once | ORAL | Status: DC

## 2022-06-14 MED ORDER — ACETAMINOPHEN 325 MG PO TABS: 650.0000 mg | ORAL_TABLET | Freq: Four times a day (QID) | ORAL | Status: DC | PRN

## 2022-06-14 MED ORDER — IMMUNE GLOBULIN (HUMAN) 10 GM/100ML IV SOLN
30.0000 g | Freq: Once | INTRAVENOUS | Status: AC
  Administered 2022-06-14: 30 g via INTRAVENOUS
  Filled 2022-06-14: qty 300

## 2022-06-14 MED ORDER — SODIUM CHLORIDE 0.9 % IV SOLN: Freq: Once | INTRAVENOUS | Status: AC

## 2022-06-14 MED ORDER — DEXTROSE 5 % IV SOLN: INTRAVENOUS | Status: DC

## 2022-06-14 NOTE — Patient Instructions (Signed)
[facility]  Discharge Instructions: Thank you for choosing [facility] to provide your oncology and hematology care.  If you have a lab appointment with the Cancer Center - please note that after April 8th, 2024, all labs will be drawn in the cancer center.  You do not have to check in or register with the main entrance as you have in the past but will complete your check-in in the cancer center.  Wear comfortable clothing and clothing appropriate for easy access to any Portacath or PICC line.   We strive to give you quality time with your provider. You may need to reschedule your appointment if you arrive late (15 or more minutes).  Arriving late affects you and other patients whose appointments are after yours.  Also, if you miss three or more appointments without notifying the office, you may be dismissed from the clinic at the provider's discretion.      For prescription refill requests, have your pharmacy contact our office and allow 72 hours for refills to be completed.    Immune Globulin Injection What is this medication? IMMUNE GLOBULIN (im MUNE  GLOB yoo lin) helps to prevent or reduce the severity of certain infections in patients who are at risk. This medicine is collected from the pooled blood of many donors. It is used to treat immune system problems, thrombocytopenia, and Kawasaki syndrome. This medicine may be used for other purposes; ask your health care provider or pharmacist if you have questions. This medicine may be used for other purposes; ask your health care provider or pharmacist if you have questions. COMMON BRAND NAME(S): ASCENIV, Baygam, BIVIGAM, Carimune, Carimune NF, cutaquig, Cuvitru, Flebogamma, Flebogamma DIF, GamaSTAN, GamaSTAN S/D, Gamimune N, Gammagard, Gammagard S/D, Gammaked, Gammaplex, Gammar-P IV, Gamunex, Gamunex-C, Hizentra, Iveegam, Iveegam EN, Octagam, Panglobulin, Panglobulin NF, panzyga, Polygam S/D, Privigen,  Sandoglobulin, Venoglobulin-S, Vigam, Vivaglobulin, Xembify What should I tell my care team before I take this medication? They need to know if you have any of these conditions: diabetes extremely low or no immune antibodies in the blood heart disease history of blood clots hyperprolinemia infection in the blood, sepsis kidney disease recently received or scheduled to receive a vaccination an unusual or allergic reaction to human immune globulin, albumin, maltose, sucrose, other medicines, foods, dyes, or preservatives pregnant or trying to get pregnant breast-feeding How should I use this medication? This medicine is for injection into a muscle or infusion into a vein or skin. It is usually given by a health care professional in a hospital or clinic setting. In rare cases, some brands of this medicine might be given at home. You will be taught how to give this medicine. Use exactly as directed. Take your medicine at regular intervals. Do not take your medicine more often than directed. Talk to your pediatrician regarding the use of this medicine in children. While this drug may be prescribed for selected conditions, precautions do apply. Overdosage: If you think you have taken too much of this medicine contact a poison control center or emergency room at once. NOTE: This medicine is only for you. Do not share this medicine with others. Overdosage: If you think you have taken too much of this medicine contact a poison control center or emergency room at once. NOTE: This medicine is only for you. Do not share this medicine with others. What if I miss a dose? It is important not to miss your dose. Call your doctor or health care professional if  you are unable to keep an appointment. If you give yourself the medicine and you miss a dose, take it as soon as you can. If it is almost time for your next dose, take only that dose. Do not take double or extra doses. What may interact with this  medication? aspirin and aspirin-like medicines cisplatin cyclosporine medicines for infection like acyclovir, adefovir, amphotericin B, bacitracin, cidofovir, foscarnet, ganciclovir, gentamicin, pentamidine, vancomycin NSAIDS, medicines for pain and inflammation, like ibuprofen or naproxen pamidronate vaccines zoledronic acid This list may not describe all possible interactions. Give your health care provider a list of all the medicines, herbs, non-prescription drugs, or dietary supplements you use. Also tell them if you smoke, drink alcohol, or use illegal drugs. Some items may interact with your medicine. This list may not describe all possible interactions. Give your health care provider a list of all the medicines, herbs, non-prescription drugs, or dietary supplements you use. Also tell them if you smoke, drink alcohol, or use illegal drugs. Some items may interact with your medicine. What should I watch for while using this medication? Your condition will be monitored carefully while you are receiving this medicine. This medicine is made from pooled blood donations of many different people. It may be possible to pass an infection in this medicine. However, the donors are screened for infections and all products are tested for HIV and hepatitis. The medicine is treated to kill most or all bacteria and viruses. Talk to your doctor about the risks and benefits of this medicine. Do not have vaccinations for at least 14 days before, or until at least 3 months after receiving this medicine. What side effects may I notice from receiving this medication? Side effects that you should report to your doctor or health care professional as soon as possible: allergic reactions like skin rash, itching or hives, swelling of the face, lips, or tongue blue colored lips or skin breathing problems chest pain or tightness fever signs and symptoms of aseptic meningitis such as stiff neck; sensitivity to light;  headache; drowsiness; fever; nausea; vomiting; rash signs and symptoms of a blood clot such as chest pain; shortness of breath; pain, swelling, or warmth in the leg signs and symptoms of hemolytic anemia such as fast heartbeat; tiredness; dark yellow or Shakeria Robinette urine; or yellowing of the eyes or skin signs and symptoms of kidney injury like trouble passing urine or change in the amount of urine sudden weight gain swelling of the ankles, feet, hands Side effects that usually do not require medical attention (report to your doctor or health care professional if they continue or are bothersome): diarrhea flushing headache increased sweating joint pain muscle cramps muscle pain nausea pain, redness, or irritation at site where injected tiredness This list may not describe all possible side effects. Call your doctor for medical advice about side effects. You may report side effects to FDA at 1-800-FDA-1088. This list may not describe all possible side effects. Call your doctor for medical advice about side effects. You may report side effects to FDA at 1-800-FDA-1088. Where should I keep my medication? Keep out of the reach of children. This drug is usually given in a hospital or clinic and will not be stored at home. In rare cases, some brands of this medicine may be given at home. If you are using this medicine at home, you will be instructed on how to store this medicine. Throw away any unused medicine after the expiration date on the label. NOTE: This  sheet is a summary. It may not cover all possible information. If you have questions about this medicine, talk to your doctor, pharmacist, or health care provider.  2024 Elsevier/Gold Standard (2018-07-25 00:00:00)    Ferumoxytol Injection What is this medication? FERUMOXYTOL (FER ue MOX i tol) treats low levels of iron in your body (iron deficiency anemia). Iron is a mineral that plays an important role in making red blood cells, which  carry oxygen from your lungs to the rest of your body. This medicine may be used for other purposes; ask your health care provider or pharmacist if you have questions. COMMON BRAND NAME(S): Feraheme What should I tell my care team before I take this medication? They need to know if you have any of these conditions: Anemia not caused by low iron levels High levels of iron in the blood Magnetic resonance imaging (MRI) test scheduled An unusual or allergic reaction to iron, other medications, foods, dyes, or preservatives Pregnant or trying to get pregnant Breastfeeding How should I use this medication? This medication is injected into a vein. It is given by your care team in a hospital or clinic setting. Talk to your care team the use of this medication in children. Special care may be needed. Overdosage: If you think you have taken too much of this medicine contact a poison control center or emergency room at once. NOTE: This medicine is only for you. Do not share this medicine with others. What if I miss a dose? It is important not to miss your dose. Call your care team if you are unable to keep an appointment. What may interact with this medication? Other iron products This list may not describe all possible interactions. Give your health care provider a list of all the medicines, herbs, non-prescription drugs, or dietary supplements you use. Also tell them if you smoke, drink alcohol, or use illegal drugs. Some items may interact with your medicine. What should I watch for while using this medication? Visit your care team regularly. Tell your care team if your symptoms do not start to get better or if they get worse. You may need blood work done while you are taking this medication. You may need to follow a special diet. Talk to your care team. Foods that contain iron include: whole grains/cereals, dried fruits, beans, or peas, leafy green vegetables, and organ meats (liver, kidney). What  side effects may I notice from receiving this medication? Side effects that you should report to your care team as soon as possible: Allergic reactions--skin rash, itching, hives, swelling of the face, lips, tongue, or throat Low blood pressure--dizziness, feeling faint or lightheaded, blurry vision Shortness of breath Side effects that usually do not require medical attention (report to your care team if they continue or are bothersome): Flushing Headache Joint pain Muscle pain Nausea Pain, redness, or irritation at injection site This list may not describe all possible side effects. Call your doctor for medical advice about side effects. You may report side effects to FDA at 1-800-FDA-1088. Where should I keep my medication? This medication is given in a hospital or clinic and will not be stored at home. NOTE: This sheet is a summary. It may not cover all possible information. If you have questions about this medicine, talk to your doctor, pharmacist, or health care provider.  2024 Elsevier/Gold Standard (2021-06-28 00:00:00)    To help prevent nausea and vomiting after your treatment, we encourage you to take your nausea medication as directed.  BELOW ARE SYMPTOMS THAT SHOULD BE REPORTED IMMEDIATELY: *FEVER GREATER THAN 100.4 F (38 C) OR HIGHER *CHILLS OR SWEATING *NAUSEA AND VOMITING THAT IS NOT CONTROLLED WITH YOUR NAUSEA MEDICATION *UNUSUAL SHORTNESS OF BREATH *UNUSUAL BRUISING OR BLEEDING *URINARY PROBLEMS (pain or burning when urinating, or frequent urination) *BOWEL PROBLEMS (unusual diarrhea, constipation, pain near the anus) TENDERNESS IN MOUTH AND THROAT WITH OR WITHOUT PRESENCE OF ULCERS (sore throat, sores in mouth, or a toothache) UNUSUAL RASH, SWELLING OR PAIN  UNUSUAL VAGINAL DISCHARGE OR ITCHING   Items with * indicate a potential emergency and should be followed up as soon as possible or go to the Emergency Department if any problems should occur.  Please show  the CHEMOTHERAPY ALERT CARD or IMMUNOTHERAPY ALERT CARD at check-in to the Emergency Department and triage nurse.  Should you have questions after your visit or need to cancel or reschedule your appointment, please contact [facility] [phone]  and follow the prompts.  Office hours are 8:00 a.m. to 4:30 p.m. Monday - Friday. Please note that voicemails left after 4:00 p.m. may not be returned until the following business day.  We are closed weekends and major holidays. You have access to a nurse at all times for urgent questions. Please call the main number to the clinic [phone] and follow the prompts.  For any non-urgent questions, you may also contact your provider using MyChart. We now offer e-Visits for anyone 51 and older to request care online for non-urgent symptoms. For details visit [email or web address].   Also download the MyChart app! Go to the app store, search "MyChart", open the app, select , and log in with your MyChart username and password.

## 2022-06-14 NOTE — Progress Notes (Signed)
Patient presents today for iron and IVIG infusion.  Patient is in satisfactory condition with no new complaints voiced.  Vital signs are stable. Tylenol and Benadryl were taken at 0700 at home prior to visit.  We will hold pre-medications today.  IV placed in R hand.  IV flushed well with good blood return noted.  We will proceed with infusion per provider orders.    Patient tolerated infusions well with no complaints voiced.  Patient left ambulatory in stable condition.  Vital signs stable at discharge.  Follow up as scheduled.

## 2022-06-23 ENCOUNTER — Ambulatory Visit (HOSPITAL_COMMUNITY): Payer: Medicare Other

## 2022-07-12 DIAGNOSIS — S81811A Laceration without foreign body, right lower leg, initial encounter: Secondary | ICD-10-CM | POA: Diagnosis not present

## 2022-07-12 DIAGNOSIS — C8580 Other specified types of non-Hodgkin lymphoma, unspecified site: Secondary | ICD-10-CM | POA: Diagnosis not present

## 2022-07-12 DIAGNOSIS — N183 Chronic kidney disease, stage 3 unspecified: Secondary | ICD-10-CM | POA: Diagnosis not present

## 2022-07-12 DIAGNOSIS — F330 Major depressive disorder, recurrent, mild: Secondary | ICD-10-CM | POA: Diagnosis not present

## 2022-07-12 DIAGNOSIS — M1990 Unspecified osteoarthritis, unspecified site: Secondary | ICD-10-CM | POA: Diagnosis not present

## 2022-07-12 DIAGNOSIS — F909 Attention-deficit hyperactivity disorder, unspecified type: Secondary | ICD-10-CM | POA: Diagnosis not present

## 2022-07-12 DIAGNOSIS — S8011XA Contusion of right lower leg, initial encounter: Secondary | ICD-10-CM | POA: Diagnosis not present

## 2022-07-12 DIAGNOSIS — J449 Chronic obstructive pulmonary disease, unspecified: Secondary | ICD-10-CM | POA: Diagnosis not present

## 2022-07-12 DIAGNOSIS — Z6821 Body mass index (BMI) 21.0-21.9, adult: Secondary | ICD-10-CM | POA: Diagnosis not present

## 2022-07-26 NOTE — Therapy (Unsigned)
OUTPATIENT PHYSICAL THERAPY CERVICAL EVALUATION   Patient Name: [name] MRN: [id number] DOB:Mar 08, 1955, 67 y.o., female Today's Date: 07/27/2022  END OF SESSION:  PT End of Session - 07/27/22 1258     Visit Number 1    Number of Visits 12    Date for PT Re-Evaluation 09/07/22    Authorization Type [employer] medicare    PT Start Time 1300    PT Stop Time 1340    PT Time Calculation (min) 40 min    Activity Tolerance Patient tolerated treatment well    Behavior During Therapy [facility] for tasks assessed/performed             Past Medical History:  Diagnosis Date   Allergic rhinitis    Anxiety    Asthma    B12 deficiency 01/18/2015   Cataract    Cavitary lung disease    Chronic sinusitis    COPD (chronic obstructive pulmonary disease) (HCC)    Depression    Diffuse large B-cell lymphoma (HCC)    DVT (deep venous thrombosis) (HCC) 09/25/2014   Essential hypertension    GERD (gastroesophageal reflux disease)    Hematuria 04/07/2016   History of pneumonia 01/2013   Hyperlipidemia    Hypogammaglobulinemia, acquired (HCC)    Iron deficiency anemia 01/18/2015   Myocardial infarction (HCC) 2011   Narcolepsy without cataplexy(347.00) 01/18/2015   Orofacial dyskinesia 04/22/2015   Osteoporosis    Peripheral neuropathy due to chemotherapy (HCC) 05/02/2016   Sleep apnea    SVC syndrome 09/25/2014   Past Surgical History:  Procedure Laterality Date   ABDOMINAL HYSTERECTOMY     Fibroids   BASAL CELL CARCINOMA EXCISION  03/2011   scalp   BREAST SURGERY Right    biopsy   CATARACT EXTRACTION W/PHACO Left 11/17/2014   Procedure: CATARACT EXTRACTION PHACO AND INTRAOCULAR LENS PLACEMENT LEFT EYE;  Surgeon: Gemma Payor, MD;  Location: [facility];  Service: Ophthalmology;  Laterality: Left;  CDE:5.60   CATARACT EXTRACTION W/PHACO Right 12/15/2014   Procedure: CATARACT EXTRACTION PHACO AND INTRAOCULAR LENS PLACEMENT RIGHT EYE CDE=5.16;  Surgeon: Gemma Payor, MD;  Location: [facility];  Service:  Ophthalmology;  Laterality: Right;   COLONOSCOPY N/A 11/14/2012   Procedure: COLONOSCOPY;  Surgeon: Malissa Hippo, MD;  Location: [facility];  Service: Endoscopy;  Laterality: N/A;  830   COLONOSCOPY WITH PROPOFOL N/A 01/12/2018   Procedure: COLONOSCOPY WITH PROPOFOL;  Surgeon: Malissa Hippo, MD;  Location: [facility];  Service: Endoscopy;  Laterality: N/A;  7:30   EYE SURGERY     NASAL SINUS SURGERY     NECK SURGERY     x 2    PERIPHERALLY INSERTED CENTRAL CATHETER INSERTION     PICC Removal     POLYPECTOMY  01/12/2018   Procedure: POLYPECTOMY;  Surgeon: Malissa Hippo, MD;  Location: [facility];  Service: Endoscopy;;  colon   PORT-A-CATH REMOVAL     PORTACATH PLACEMENT  7/11   Removed 6/12   TRACHEOSTOMY     VESICOVAGINAL FISTULA CLOSURE W/ TAH     Patient Active Problem List   Diagnosis Date Noted   Hx of colonic polyps 12/13/2017   Peripheral neuropathy due to chemotherapy (HCC) 05/02/2016   Recurrent herpes labialis 05/02/2016   Osteoporosis 04/30/2015   Narcolepsy without cataplexy(347.00) 01/18/2015   Iron deficiency anemia 01/18/2015   B12 deficiency 01/18/2015   SVC syndrome 09/25/2014   Sinusitis, chronic 05/06/2014   Abnormal finding on imaging 02/11/2014   OSA (  obstructive sleep apnea) 09/10/2012   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) 02/15/2012   Hypogammaglobulinemia, acquired (HCC) 12/30/2009   Non Hodgkin's lymphoma (HCC) 09/09/2009   HLD (hyperlipidemia) 04/21/2007   Essential hypertension 04/21/2007   DIABETES MELLITUS, BORDERLINE 04/21/2007   Moderate persistent asthma 04/03/2007   Allergic rhinitis 10/19/2006   VOCAL CORD DISORDER 10/19/2006   GERD 10/19/2006   GASTRIC POLYP 07/25/2006    PCP: Assunta Found   REFERRING PROVIDER: Tressie Stalker, MD  REFERRING DIAG:  Diagnosis  M43.12 (ICD-10-CM) - Spondylolisthesis, cervical region    THERAPY DIAG:  Cervicalgia   Rationale for Evaluation and Treatment:  Rehabilitation  ONSET DATE: chronic; Ms. Romito states that she has had three surgeries on her neck the latest was about 7 months ago.  She states that she continues to have a little numbness in her arms and hands but not near as much as she did.  She is not completing any exercises at this time.                                                                                                                                                                                                            SUBJECTIVE STATEMENT: The pt states that her RT side bothers her more as far as numbness but both sides have equal pain.  The numbness comes and goes.    PERTINENT HISTORY:  hypogammaglobulinemia and large B-cell lymphoma  PAIN:  Are you having pain? Yes: NPRS scale: 1/10;  worst past week 8/10  Pain location: upper trap area B  Pain description: burning  Aggravating factors: vacuuming  Relieving factors: heat and rubs.   PRECAUTIONS: cervical fusion     WEIGHT BEARING RESTRICTIONS: No  FALLS:  Has patient fallen in last 6 months? No  LIVING ENVIRONMENT: Lives with: lives alone Lives in: House/apartment OCCUPATION: retired  PLOF: Independent  PATIENT GOALS: less pain. To have better posture   NEXT MD VISIT: unsure  OBJECTIVE:   DIAGNOSTIC FINDINGS:  IMPRESSION: 1. Chronic C5 through C7 fusion with solid arthrodesis and no adverse features.   2. Progressed adjacent segment disease at C4-C5 since 2012 with mild new anterolisthesis, moderate disc and posterior element degeneration. Mild spinal stenosis AND spinal cord mass effect. No cord mass effect. Moderate to severe bilateral foraminal stenosis. Query C5 radiculitis.   3. Mild new anterolisthesis also at the C7-T1 adjacent segment but no associated stenosis.   4. Chronic upper thoracic compression fractures.     Electronically Signed   By: Althea Grimmer.D.  PATIENT SURVEYS:  FOTO 57  COGNITION: Overall cognitive  status: Within functional limits for tasks assessed   POSTURE: rounded shoulders, forward head, decreased lumbar lordosis, increased thoracic kyphosis, and flexed trunk   PALPATION: Extremely tight trap mm B    CERVICAL ROM: (3 surgeries with fusion)  Active ROM A/PROM (deg) eval  Flexion 22  Extension 10  Right lateral flexion   Left lateral flexion   Right rotation 30  Left rotation 38   (Blank rows = not tested) Cervical mm:  Rt SB: 3; LT 3-: extension 3- UPPER EXTREMITY ROM: WFL  UPPER EXTREMITY MMT:  MMT Right eval Left eval  Shoulder flexion    Shoulder extension    Shoulder abduction    Shoulder adduction    Shoulder extension    Shoulder internal rotation    Shoulder external rotation    Middle trapezius    Lower trapezius    Elbow flexion    Elbow extension    Wrist flexion    Wrist extension    Wrist ulnar deviation    Wrist radial deviation    Wrist pronation    Wrist supination    Grip strength 15 27   (Blank rows = not tested)  TODAY'S TREATMENT:                                                                                                                              DATE: Evaluation   Correct Seated Posture  5-hold 5" - Seated Scapular Retraction  10 reps - 5" hold - Seated Cervical Retraction  10 reps - 5 hold Hand grip with putty B x 10   PATIENT EDUCATION:  Education details: HEP Person educated: Patient Education method: Explanation and Handouts Education comprehension: verbalized understanding  HOME EXERCISE PROGRAM: Access Code: [email or web address] URL: [email or web address] Date: 07/27/2022 Prepared by: Virgina Organ  Exercises - Correct Seated Posture  - 4 x daily - 7 x weekly - 1 sets - 5-10 reps - Seated Scapular Retraction  - 4 x daily - 7 x weekly - 1 sets - 5-10 reps - 3-5" hold - Seated Cervical Retraction  - 4 x daily - 7 x weekly - 1 sets - 10 reps - 3-5 hold Hand grip with putty  ASSESSMENT:  CLINICAL  IMPRESSION: Patient is a 66 y.o. female who was seen today for physical therapy evaluation and treatment for cervcial pain secondary to spondylolisthesis.  Evaluation demonstrates decreased strength, decreased ROM, postural dysfunction, mm spasms  and increased pain.  Ms. Glandon will benefit from skilled PT to address these issues and improve the pt functioning ability.  OBJECTIVE IMPAIRMENTS: decreased activity tolerance, decreased ROM, decreased strength, increased fascial restrictions, impaired perceived functional ability, postural dysfunction, and pain.   ACTIVITY LIMITATIONS: carrying, lifting, and reach over head  PARTICIPATION LIMITATIONS: cleaning, community activity, and yard work  PERSONAL FACTORS: Past/current experiences and Time since onset of injury/illness/exacerbation are also affecting patient's functional outcome.  REHAB POTENTIAL: Good  CLINICAL DECISION MAKING: Evolving/moderate complexity  EVALUATION COMPLEXITY: Moderate   GOALS: Goals reviewed with patient? No  SHORT TERM GOALS: Target date: 08/17/22  PT to be I in HEP to decrease her pain to no greater than a 5/10 Baseline:  Goal status: INITIAL  2.  Pt to increase her ROM by 10 degrees to allow improved scanning of area  Baseline:  Goal status: INITIAL  3.  PT to feel her numbness has decreased by 25% Baseline:  Goal status: INITIAL    LONG TERM GOALS: Target date: 09/07/22  PT to be I in HEP to decrease her pain to no greater than a 3/10 Baseline:  Goal status: INITIAL  2.  Pt to increase her ROM by 20 degrees to allow safer driving   Baseline:  Goal status: INITIAL  3.  PT to feel her numbness has decreased by 50% Baseline:  Goal status: INITIAL   PLAN:  PT FREQUENCY: 2x/week  PT DURATION: 6 weeks  PLANNED INTERVENTIONS: Therapeutic exercises, Therapeutic activity, Patient/Family education, Self Care, Joint mobilization, and Manual therapy  PLAN FOR NEXT SESSION: Begin focusing on  manual and postural, isometric cervical while supine for better cervical positioning.   Virgina Organ, PT CLT [phone]  07/27/2022, 1:48 PM

## 2022-07-27 ENCOUNTER — Ambulatory Visit (HOSPITAL_COMMUNITY): Payer: Medicare Other | Attending: Family Medicine | Admitting: Physical Therapy

## 2022-07-27 ENCOUNTER — Other Ambulatory Visit: Payer: Self-pay

## 2022-07-27 DIAGNOSIS — M6281 Muscle weakness (generalized): Secondary | ICD-10-CM | POA: Diagnosis not present

## 2022-07-27 DIAGNOSIS — M5412 Radiculopathy, cervical region: Secondary | ICD-10-CM | POA: Diagnosis not present

## 2022-08-01 ENCOUNTER — Encounter (HOSPITAL_COMMUNITY): Payer: Medicare Other | Admitting: Physical Therapy

## 2022-08-02 ENCOUNTER — Inpatient Hospital Stay: Payer: Medicare Other | Attending: Hematology

## 2022-08-02 DIAGNOSIS — D509 Iron deficiency anemia, unspecified: Secondary | ICD-10-CM | POA: Diagnosis not present

## 2022-08-02 DIAGNOSIS — C8330 Diffuse large B-cell lymphoma, unspecified site: Secondary | ICD-10-CM | POA: Insufficient documentation

## 2022-08-02 DIAGNOSIS — D508 Other iron deficiency anemias: Secondary | ICD-10-CM

## 2022-08-02 DIAGNOSIS — D801 Nonfamilial hypogammaglobulinemia: Secondary | ICD-10-CM | POA: Insufficient documentation

## 2022-08-02 DIAGNOSIS — C8338 Diffuse large B-cell lymphoma, lymph nodes of multiple sites: Secondary | ICD-10-CM

## 2022-08-02 LAB — CBC WITH DIFFERENTIAL/PLATELET
Abs Immature Granulocytes: 0.02 10*3/uL (ref 0.00–0.07)
Basophils Absolute: 0 10*3/uL (ref 0.0–0.1)
Basophils Relative: 1 %
Eosinophils Absolute: 0.3 10*3/uL (ref 0.0–0.5)
Eosinophils Relative: 4 %
HCT: 36 % (ref 36.0–46.0)
Hemoglobin: 11.3 g/dL — ABNORMAL LOW (ref 12.0–15.0)
Immature Granulocytes: 0 %
Lymphocytes Relative: 23 %
Lymphs Abs: 1.5 10*3/uL (ref 0.7–4.0)
MCH: 31.4 pg (ref 26.0–34.0)
MCHC: 31.4 g/dL (ref 30.0–36.0)
MCV: 100 fL (ref 80.0–100.0)
Monocytes Absolute: 0.6 10*3/uL (ref 0.1–1.0)
Monocytes Relative: 8 %
Neutro Abs: 4.3 10*3/uL (ref 1.7–7.7)
Neutrophils Relative %: 64 %
Platelets: 171 10*3/uL (ref 150–400)
RBC: 3.6 MIL/uL — ABNORMAL LOW (ref 3.87–5.11)
RDW: 13.4 % (ref 11.5–15.5)
WBC: 6.7 10*3/uL (ref 4.0–10.5)
nRBC: 0 % (ref 0.0–0.2)

## 2022-08-02 LAB — COMPREHENSIVE METABOLIC PANEL
ALT: 11 U/L (ref 0–44)
AST: 14 U/L — ABNORMAL LOW (ref 15–41)
Albumin: 3.3 g/dL — ABNORMAL LOW (ref 3.5–5.0)
Alkaline Phosphatase: 110 U/L (ref 38–126)
Anion gap: 3 — ABNORMAL LOW (ref 5–15)
BUN: 11 mg/dL (ref 8–23)
CO2: 31 mmol/L (ref 22–32)
Calcium: 8.4 mg/dL — ABNORMAL LOW (ref 8.9–10.3)
Chloride: 103 mmol/L (ref 98–111)
Creatinine, Ser: 0.92 mg/dL (ref 0.44–1.00)
GFR, Estimated: >60 mL/min (ref >60)
Glucose, Bld: 87 mg/dL (ref 70–99)
Potassium: 3.9 mmol/L (ref 3.5–5.1)
Sodium: 137 mmol/L (ref 135–145)
Total Bilirubin: 0.4 mg/dL (ref 0.3–1.2)
Total Protein: 6.9 g/dL (ref 6.5–8.1)

## 2022-08-02 LAB — IRON AND TIBC
Iron: 74 ug/dL (ref 28–170)
Saturation Ratios: 44 % — ABNORMAL HIGH (ref 10.4–31.8)
TIBC: 169 ug/dL — ABNORMAL LOW (ref 250–450)
UIBC: 95 ug/dL

## 2022-08-02 LAB — MAGNESIUM: Magnesium: 1.8 mg/dL (ref 1.7–2.4)

## 2022-08-02 LAB — LACTATE DEHYDROGENASE: LDH: 145 U/L (ref 98–192)

## 2022-08-02 LAB — FERRITIN: Ferritin: 227 ng/mL (ref 11–307)

## 2022-08-03 ENCOUNTER — Ambulatory Visit (HOSPITAL_COMMUNITY): Payer: Medicare Other | Admitting: Physical Therapy

## 2022-08-03 DIAGNOSIS — M5412 Radiculopathy, cervical region: Secondary | ICD-10-CM | POA: Diagnosis not present

## 2022-08-03 DIAGNOSIS — M6281 Muscle weakness (generalized): Secondary | ICD-10-CM

## 2022-08-03 NOTE — Therapy (Signed)
OUTPATIENT PHYSICAL THERAPY CERVICAL Treatment    Patient Name: [name] MRN: [id number] DOB:1955/10/19, 67 y.o., female Today's Date: 08/03/2022  END OF SESSION:  PT End of Session - 08/03/22 1725     Visit Number 2    Number of Visits 12    Date for PT Re-Evaluation 09/07/22    Authorization Type [employer] medicare    PT Start Time 1645    PT Stop Time 1725    PT Time Calculation (min) 40 min    Activity Tolerance Patient tolerated treatment well    Behavior During Therapy [facility] for tasks assessed/performed           Past Medical History:  Diagnosis Date   Allergic rhinitis    Anxiety    Asthma    B12 deficiency 01/18/2015   Cataract    Cavitary lung disease    Chronic sinusitis    COPD (chronic obstructive pulmonary disease) (HCC)    Depression    Diffuse large B-cell lymphoma (HCC)    DVT (deep venous thrombosis) (HCC) 09/25/2014   Essential hypertension    GERD (gastroesophageal reflux disease)    Hematuria 04/07/2016   History of pneumonia 01/2013   Hyperlipidemia    Hypogammaglobulinemia, acquired (HCC)    Iron deficiency anemia 01/18/2015   Myocardial infarction (HCC) 2011   Narcolepsy without cataplexy(347.00) 01/18/2015   Orofacial dyskinesia 04/22/2015   Osteoporosis    Peripheral neuropathy due to chemotherapy (HCC) 05/02/2016   Sleep apnea    SVC syndrome 09/25/2014   Past Surgical History:  Procedure Laterality Date   ABDOMINAL HYSTERECTOMY     Fibroids   BASAL CELL CARCINOMA EXCISION  03/2011   scalp   BREAST SURGERY Right    biopsy   CATARACT EXTRACTION W/PHACO Left 11/17/2014   Procedure: CATARACT EXTRACTION PHACO AND INTRAOCULAR LENS PLACEMENT LEFT EYE;  Surgeon: Gemma Payor, MD;  Location: [facility];  Service: Ophthalmology;  Laterality: Left;  CDE:5.60   CATARACT EXTRACTION W/PHACO Right 12/15/2014   Procedure: CATARACT EXTRACTION PHACO AND INTRAOCULAR LENS PLACEMENT RIGHT EYE CDE=5.16;  Surgeon: Gemma Payor, MD;  Location: [facility];  Service:  Ophthalmology;  Laterality: Right;   COLONOSCOPY N/A 11/14/2012   Procedure: COLONOSCOPY;  Surgeon: Malissa Hippo, MD;  Location: [facility];  Service: Endoscopy;  Laterality: N/A;  830   COLONOSCOPY WITH PROPOFOL N/A 01/12/2018   Procedure: COLONOSCOPY WITH PROPOFOL;  Surgeon: Malissa Hippo, MD;  Location: [facility];  Service: Endoscopy;  Laterality: N/A;  7:30   EYE SURGERY     NASAL SINUS SURGERY     NECK SURGERY     x 2    PERIPHERALLY INSERTED CENTRAL CATHETER INSERTION     PICC Removal     POLYPECTOMY  01/12/2018   Procedure: POLYPECTOMY;  Surgeon: Malissa Hippo, MD;  Location: [facility];  Service: Endoscopy;;  colon   PORT-A-CATH REMOVAL     PORTACATH PLACEMENT  7/11   Removed 6/12   TRACHEOSTOMY     VESICOVAGINAL FISTULA CLOSURE W/ TAH     Patient Active Problem List   Diagnosis Date Noted   Hx of colonic polyps 12/13/2017   Peripheral neuropathy due to chemotherapy (HCC) 05/02/2016   Recurrent herpes labialis 05/02/2016   Osteoporosis 04/30/2015   Narcolepsy without cataplexy(347.00) 01/18/2015   Iron deficiency anemia 01/18/2015   B12 deficiency 01/18/2015   SVC syndrome 09/25/2014   Sinusitis, chronic 05/06/2014   Abnormal finding on imaging 02/11/2014   OSA (obstructive  sleep apnea) 09/10/2012   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) 02/15/2012   Hypogammaglobulinemia, acquired (HCC) 12/30/2009   Non Hodgkin's lymphoma (HCC) 09/09/2009   HLD (hyperlipidemia) 04/21/2007   Essential hypertension 04/21/2007   DIABETES MELLITUS, BORDERLINE 04/21/2007   Moderate persistent asthma 04/03/2007   Allergic rhinitis 10/19/2006   VOCAL CORD DISORDER 10/19/2006   GERD 10/19/2006   GASTRIC POLYP 07/25/2006    PCP: Assunta Found   REFERRING PROVIDER: Tressie Stalker, MD  REFERRING DIAG:  Diagnosis  M43.12 (ICD-10-CM) - Spondylolisthesis, cervical region    THERAPY DIAG:  Cervicalgia   Rationale for Evaluation and Treatment:  Rehabilitation  ONSET DATE: chronic;                                                                                                                                                                                                           SUBJECTIVE STATEMENT: Pt has been doing her exercises she was a little sore but not bad.   PERTINENT HISTORY:  hypogammaglobulinemia and large B-cell lymphoma  PAIN:  Are you having pain? Yes: NPRS scale: 0/10;  worst past week 8/10  Pain location: upper trap area B  Pain description: burning  Aggravating factors: vacuuming  Relieving factors: heat and rubs.   PRECAUTIONS: cervical fusion     WEIGHT BEARING RESTRICTIONS: No  FALLS:  Has patient fallen in last 6 months? No  LIVING ENVIRONMENT: Lives with: lives alone Lives in: House/apartment OCCUPATION: retired  PLOF: Independent  PATIENT GOALS: less pain. To have better posture   NEXT MD VISIT: unsure  OBJECTIVE:   DIAGNOSTIC FINDINGS:  IMPRESSION: 1. Chronic C5 through C7 fusion with solid arthrodesis and no adverse features.   2. Progressed adjacent segment disease at C4-C5 since 2012 with mild new anterolisthesis, moderate disc and posterior element degeneration. Mild spinal stenosis AND spinal cord mass effect. No cord mass effect. Moderate to severe bilateral foraminal stenosis. Query C5 radiculitis.   3. Mild new anterolisthesis also at the C7-T1 adjacent segment but no associated stenosis.   4. Chronic upper thoracic compression fractures.     Electronically Signed   By: Odessa Fleming M.D.  PATIENT SURVEYS:  FOTO 57  COGNITION: Overall cognitive status: Within functional limits for tasks assessed   POSTURE: rounded shoulders, forward head, decreased lumbar lordosis, increased thoracic kyphosis, and flexed trunk   PALPATION: Extremely tight trap mm B    CERVICAL ROM: (3 surgeries with fusion)  Active ROM A/PROM (deg) eval  Flexion 22  Extension 10  Right  lateral flexion  Left lateral flexion   Right rotation 30  Left rotation 38   (Blank rows = not tested) Cervical mm:  Rt SB: 3; LT 3-: extension 3- UPPER EXTREMITY ROM: WFL  UPPER EXTREMITY MMT:  MMT Right eval Left eval  Shoulder flexion    Shoulder extension    Shoulder abduction    Shoulder adduction    Shoulder extension    Shoulder internal rotation    Shoulder external rotation    Middle trapezius    Lower trapezius    Elbow flexion    Elbow extension    Wrist flexion    Wrist extension    Wrist ulnar deviation    Wrist radial deviation    Wrist pronation    Wrist supination    Grip strength 15 27   (Blank rows = not tested)  TODAY'S TREATMENT:                                                                                                                              DATE: 08/03/22: Supine: Manual to improve ROM and decrease spasm and Pain Cervical rotation x 5 Isometric SB x 5 Isometric cervical retraction x 5 Scapular retraction x 5 Sitting : Cervical excursion x 3 Tall posture with scapular retraction x 10 W back x 10  Shoulder rolls x 10  UBE: level 1 x 3 minutes Standing attempting to get into better posture.(Occipital to wall test)   Evaluation: 7;24/24   Correct Seated Posture  5-hold 5" - Seated Scapular Retraction  10 reps - 5" hold - Seated Cervical Retraction  10 reps - 5 hold Hand grip with putty B x 10   PATIENT EDUCATION:  Education details: HEP Person educated: Patient Education method: Explanation and Handouts Education comprehension: verbalized understanding  HOME EXERCISE PROGRAM: Access Code: [email or web address] URL: [email or web address]  08/03/22 - Seated Scapular Retraction with External Rotation  - 3 x daily - 7 x weekly - 1 sets - 10 reps - 3" hold - Seated Shoulder Shrug Circles AROM Backward  - 3 x daily - 7 x weekly - 1 sets - 10 reps Cervical excursions Date: 07/27/2022 Prepared by: Virgina Organ  Exercises - Correct Seated Posture  - 4 x daily - 7 x weekly - 1 sets - 5-10 reps - Seated Scapular Retraction  - 4 x daily - 7 x weekly - 1 sets - 5-10 reps - 3-5" hold - Seated Cervical Retraction  - 4 x daily - 7 x weekly - 1 sets - 10 reps - 3-5 hold Hand grip with putty  ASSESSMENT:  CLINICAL IMPRESSION:Treatment continued to focus on improving ROM, posture and strength.  Manual completed to improve ROM, decrease tightness and pain.   Pt occipital to wall test over 4".   Patient is a 67 y.o. female who was seen today for physical therapy evaluation and treatment for cervcial pain secondary to spondylolisthesis.  Evaluation demonstrates decreased strength, decreased ROM, postural  dysfunction, mm spasms  and increased pain.  Ms. Morss will benefit from skilled PT to address these issues and improve the pt functioning ability.  OBJECTIVE IMPAIRMENTS: decreased activity tolerance, decreased ROM, decreased strength, increased fascial restrictions, impaired perceived functional ability, postural dysfunction, and pain.   ACTIVITY LIMITATIONS: carrying, lifting, and reach over head  PARTICIPATION LIMITATIONS: cleaning, community activity, and yard work  PERSONAL FACTORS: Past/current experiences and Time since onset of injury/illness/exacerbation are also affecting patient's functional outcome.   REHAB POTENTIAL: Good  CLINICAL DECISION MAKING: Evolving/moderate complexity  EVALUATION COMPLEXITY: Moderate   GOALS: Goals reviewed with patient? No  SHORT TERM GOALS: Target date: 08/17/22  PT to be I in HEP to decrease her pain to no greater than a 5/10 Baseline:  Goal status: on-going   2.  Pt to increase her ROM by 10 degrees to allow improved scanning of area  Baseline:  Goal status: on-going   3.  PT to feel her numbness has decreased by 25% Baseline:  Goal status: on-going     LONG TERM GOALS: Target date: 09/07/22  PT to be I in HEP to decrease her pain to no  greater than a 3/10 Baseline:  Goal status: on-going   2.  Pt to increase her ROM by 20 degrees to allow safer driving   Baseline:  Goal status: on-going   3.  PT to feel her numbness has decreased by 50% Baseline:  Goal status: on-going    PLAN:  PT FREQUENCY: 2x/week  PT DURATION: 6 weeks  PLANNED INTERVENTIONS: Therapeutic exercises, Therapeutic activity, Patient/Family education, Self Care, Joint mobilization, and Manual therapy  PLAN FOR NEXT SESSION: measure occipital to wall to have objective measurement.  Continue with manual and postural, isometric cervical while supine for better cervical positioning.   Virgina Organ, PT CLT [phone]  08/03/2022, 5:30 PM

## 2022-08-08 NOTE — Progress Notes (Signed)
[facility] [address], [place] [address]    Clinic Day:  08/09/2022  Referring physician: Assunta Found, MD  Patient Care Team: Assunta Found, MD as PCP - General (Family Medicine) Jonelle Sidle, MD as PCP - Cardiology (Cardiology) Malissa Hippo, MD (Inactive) as Consulting Physician (Gastroenterology) Lauris Poag, MD as Consulting Physician (Nephrology) Ralene Cork, MD as Consulting Physician (Oncology) Beryle Beams, MD as Consulting Physician (Neurology) Marcelino Duster, MD as Referring Physician (Dermatology) Daisy Lazar, DO (Optometry) Jamison Neighbor Amreen Christen, MD (Inactive) as Consulting Physician (Pulmonary Disease)   ASSESSMENT & PLAN:   Assessment: 1. Acquired hypogammaglobulinemia: -She has been receiving IVIG every 8 weeks since January 2020. -She does have severely low IgA levels.  However he does not have any reactions.   2.  DLBCL, stage IVb: -She was diagnosed 07/08/2009.  Treated with 4 cycles of R-CHOP with intrathecal methotrexate during cycle 2 and 4.  She developed a Pseudomonas sepsis after cycles 2 and 4 and therapy was stopped after 4 cycles.  She also received 2 years of maintenance rituximab. -Last PET scan on 03/20/2017 did not show any evidence of lymphoma.   3.  Health maintenance: -Mammogram on 11/08/2018, BI-RADS Category 1.  Plan: 1. Acquired hypogammaglobulinemia: - She did not have any infections in the last 4 months.  IgG trough level is 1363. - Labs today: Normal LFTs, creatinine.  CBC was grossly normal. - Continue with IVIG today and every 8 weeks.  RTC 4 months for follow-up with repeat labs.   2.  DLBCL, stage IVb: - She does not have any palpable adenopathy or splenomegaly.  No B symptoms.   3.  Normocytic anemia: - She denies any bleeding per rectum or melena.  She is taking iron tablet daily and tolerates well. - Hemoglobin today is 12.3.  However her ferritin is down to 78 from 117. - Recommend  Feraheme 500 mg IV x 1 with next IVIG infusion in 8 weeks.    No orders of the defined types were placed in this encounter.      Alben Deeds Teague,acting as a Neurosurgeon for Doreatha Massed, MD.,have documented all relevant documentation on the behalf of Doreatha Massed, MD,as directed by  Doreatha Massed, MD while in the presence of Doreatha Massed, MD.  ***   [place] R Teague   8/5/202412:55 PM  CHIEF COMPLAINT:   Diagnosis: acquired hypogammaglobulinemia and DLBCL    Cancer Staging  Non Hodgkin's lymphoma (HCC) Staging form: Lymphoid Neoplasms, AJCC 6th Edition - Clinical: Stage IV - Signed by Ellouise Newer, PA on 11/05/2010    Prior Therapy: R-CHOP x 4 cycles with intrathecal methotrexate during cycles 2 & 4   Current Therapy:  IVIG every 8 weeks; intermittent Feraheme last on 12/13/2019    HISTORY OF PRESENT ILLNESS:   Oncology History  Non Hodgkin's lymphoma (HCC)  09/09/2009 Initial Diagnosis   Non Hodgkin's lymphoma (HCC)      INTERVAL HISTORY:   [name] is a 67 y.o. female presenting to clinic today for follow up of acquired hypogammaglobulinemia and DLBCL. She was last seen by me on 04/19/22.  Today, she states that she is doing well overall. Her appetite level is at ***%. Her energy level is at ***%.  PAST MEDICAL HISTORY:   Past Medical History: Past Medical History:  Diagnosis Date   Allergic rhinitis    Anxiety    Asthma    B12 deficiency 01/18/2015   Cataract  Cavitary lung disease    Chronic sinusitis    COPD (chronic obstructive pulmonary disease) (HCC)    Depression    Diffuse large B-cell lymphoma (HCC)    DVT (deep venous thrombosis) (HCC) 09/25/2014   Essential hypertension    GERD (gastroesophageal reflux disease)    Hematuria 04/07/2016   History of pneumonia 01/2013   Hyperlipidemia    Hypogammaglobulinemia, acquired (HCC)    Iron deficiency anemia 01/18/2015   Myocardial infarction [facility]) 2011   Narcolepsy without  cataplexy(347.00) 01/18/2015   Orofacial dyskinesia 04/22/2015   Osteoporosis    Peripheral neuropathy due to chemotherapy (HCC) 05/02/2016   Sleep apnea    SVC syndrome 09/25/2014    Surgical History: Past Surgical History:  Procedure Laterality Date   ABDOMINAL HYSTERECTOMY     Fibroids   BASAL CELL CARCINOMA EXCISION  03/2011   scalp   BREAST SURGERY Right    biopsy   CATARACT EXTRACTION W/PHACO Left 11/17/2014   Procedure: CATARACT EXTRACTION PHACO AND INTRAOCULAR LENS PLACEMENT LEFT EYE;  Surgeon: Gemma Payor, MD;  Location: [facility];  Service: Ophthalmology;  Laterality: Left;  CDE:5.60   CATARACT EXTRACTION W/PHACO Right 12/15/2014   Procedure: CATARACT EXTRACTION PHACO AND INTRAOCULAR LENS PLACEMENT RIGHT EYE CDE=5.16;  Surgeon: Gemma Payor, MD;  Location: [facility];  Service: Ophthalmology;  Laterality: Right;   COLONOSCOPY N/A 11/14/2012   Procedure: COLONOSCOPY;  Surgeon: Malissa Hippo, MD;  Location: [facility];  Service: Endoscopy;  Laterality: N/A;  830   COLONOSCOPY WITH PROPOFOL N/A 01/12/2018   Procedure: COLONOSCOPY WITH PROPOFOL;  Surgeon: Malissa Hippo, MD;  Location: [facility];  Service: Endoscopy;  Laterality: N/A;  7:30   EYE SURGERY     NASAL SINUS SURGERY     NECK SURGERY     x 2    PERIPHERALLY INSERTED CENTRAL CATHETER INSERTION     PICC Removal     POLYPECTOMY  01/12/2018   Procedure: POLYPECTOMY;  Surgeon: Malissa Hippo, MD;  Location: [facility];  Service: Endoscopy;;  colon   PORT-A-CATH REMOVAL     PORTACATH PLACEMENT  7/11   Removed 6/12   TRACHEOSTOMY     VESICOVAGINAL FISTULA CLOSURE W/ TAH      Social History: Social History   Socioeconomic History   Marital status: Divorced    Spouse name: Not on file   Number of children: 3   Years of education: 12   Highest education level: Not on file  Occupational History   Occupation: teller    Comment: [employer]  Tobacco Use   Smoking status: Never   Smokeless tobacco: Never   Vaping Use   Vaping status: Never Used  Substance and Sexual Activity   Alcohol use: No   Drug use: No   Sexual activity: Not on file    Comment: hyst  Other Topics Concern   Not on file  Social History Narrative   Originally from [place]. Always lived in [place]. Prior travel to [place] & [place]. Previously worked in a Physicist, medical as a Producer, television/film/video. Currently works as a Haematologist. She has 1 dog currently. She has 1 conures (small parrots). No mold exposure in her home. 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 No hot tub exposure. She enjoys sewing & quilting.    Social Determinants of Health   Financial Resource Strain: Medium Risk (12/03/2019)   Overall Financial Resource Strain (CARDIA)    Difficulty of Paying  Living Expenses: Somewhat hard  Food Insecurity: No Food Insecurity (12/03/2019)   Hunger Vital Sign    Worried About Running Out of Food in the Last Year: Never true    Ran Out of Food in the Last Year: Never true  Transportation Needs: No Transportation Needs (12/03/2019)   PRAPARE - Administrator, Civil Service (Medical): No    Lack of Transportation (Non-Medical): No  Physical Activity: Insufficiently Active (12/03/2019)   Exercise Vital Sign    Days of Exercise per Week: 2 days    Minutes of Exercise per Session: 10 min  Stress: No Stress Concern Present (12/03/2019)   [employer] of Occupational Health - Occupational Stress Questionnaire    Feeling of Stress : Not at all  Social Connections: Moderately Integrated (12/03/2019)   Social Connection and Isolation Panel [NHANES]    Frequency of Communication with Friends and Family: More than three times a week    Frequency of Social Gatherings with Friends and Family: More than three times a week    Attends Religious Services: More than 4 times per year    Active Member of [employer] or Organizations: Yes    Attends Engineer, structural: More than 4 times per year    Marital Status:  Divorced  Intimate Partner Violence: Not At Risk (12/03/2019)   Humiliation, Afraid, Rape, and Kick questionnaire    Fear of Current or Ex-Partner: No    Emotionally Abused: No    Physically Abused: No    Sexually Abused: No    Family History: Family History  Problem Relation Age of Onset   Emphysema Mother    Stroke Mother    COPD Mother    Heart disease Mother        died in sleep  15   Allergies Father    Asthma Father        as a child   Arthritis Father    Parkinson's disease Father 72   Leukemia Maternal Grandmother    Cancer Maternal Grandmother        Leukemia   Diabetes Brother    Heart attack Brother    Hypertension Brother    Heart disease Brother 40       stents   Hypertension Sister    Hypertension Brother     Current Medications:  Current Outpatient Medications:    acetaminophen (TYLENOL) 500 MG tablet, Take 1,000 mg by mouth every 6 (six) hours as needed for moderate pain or headache. , Disp: , Rfl:    acyclovir (ZOVIRAX) 400 MG tablet, Take 1 tablet (400 mg total) by mouth 2 (two) times daily., Disp: 60 tablet, Rfl: 6   albuterol (PROAIR HFA) 108 (90 Base) MCG/ACT inhaler, Inhale 2 puffs into the lungs every 6 (six) hours as needed. (Patient taking differently: Inhale 2 puffs into the lungs every 6 (six) hours as needed for wheezing or shortness of breath.), Disp: 8.5 g, Rfl: 2   albuterol (PROVENTIL) (2.5 MG/3ML) 0.083% nebulizer solution, Take 2.5 mg by nebulization every 4 (four) hours as needed for wheezing or shortness of breath., Disp: , Rfl:    amphetamine-dextroamphetamine (ADDERALL) 10 MG tablet, Take 10 mg by mouth every morning. , Disp: , Rfl: 0   Armodafinil 150 MG tablet, Take 150 mg by mouth every morning. , Disp: , Rfl:    aspirin EC 81 MG tablet, Take 81 mg by mouth daily. Swallow whole., Disp: , Rfl:    budesonide-formoterol (SYMBICORT) 160-4.5 MCG/ACT inhaler,  Inhale 2 puffs into the lungs 2 (two) times daily., Disp: 1 Inhaler, Rfl: 5    celecoxib (CELEBREX) 200 MG capsule, Take 200 mg by mouth 2 (two) times daily as needed., Disp: , Rfl:    Cholecalciferol (VITAMIN D3 PO), Take 1 capsule by mouth at bedtime., Disp: , Rfl:    citalopram (CELEXA) 10 MG tablet, Take 10 mg by mouth daily., Disp: , Rfl:    cyanocobalamin 1000 MCG tablet, Take 1,000 mcg by mouth daily., Disp: , Rfl:    cyclobenzaprine (FLEXERIL) 5 MG tablet, Take 5 mg by mouth every 8 (eight) hours as needed., Disp: , Rfl:    gabapentin (NEURONTIN) 600 MG tablet, 300 mg 2 (two) times daily. , Disp: , Rfl:    olmesartan (BENICAR) 20 MG tablet, Take 20 mg by mouth daily., Disp: , Rfl:    omeprazole (PRILOSEC) 40 MG capsule, Take 40 mg by mouth daily., Disp: , Rfl:    oxyCODONE-acetaminophen (PERCOCET/ROXICET) 5-325 MG tablet, Take 1 tablet by mouth every 4 (four) hours as needed., Disp: , Rfl:    predniSONE (DELTASONE) 10 MG tablet, Take by mouth., Disp: , Rfl:    simvastatin (ZOCOR) 40 MG tablet, Take 40 mg by mouth daily., Disp: , Rfl:    zafirlukast (ACCOLATE) 20 MG tablet, TAKE 1 TABLET BY MOUTH TWICE DAILY., Disp: 60 tablet, Rfl: 0 No current facility-administered medications for this visit.  Facility-Administered Medications Ordered in Other Visits:    0.9 %  sodium chloride infusion, , Intravenous, Once, Kefalas, Thomas S, PA-C   0.9 %  sodium chloride infusion, , Intravenous, Once, Kefalas, Thomas S, PA-C   acetaminophen (TYLENOL) tablet 650 mg, 650 mg, Oral, Q6H PRN, Kefalas, Thomas S, PA-C   acetaminophen (TYLENOL) tablet 650 mg, 650 mg, Oral, Q6H PRN, Kefalas, Thomas S, PA-C   dextrose 5 % solution, , Intravenous, Continuous, Kefalas, Maurine Minister, PA-C, Stopped at 08/25/15 1400   dextrose 5 % solution, , Intravenous, Continuous, Kefalas, Maurine Minister, PA-C, Stopped at 07/13/16 1350   dextrose 5 % solution, , Intravenous, Continuous, Hubbard Hartshorn, NP, Stopped at 02/28/17 1230   diphenhydrAMINE (BENADRYL) capsule 25 mg, 25 mg, Oral, Once, Kefalas, Thomas S,  PA-C   diphenhydrAMINE (BENADRYL) capsule 25 mg, 25 mg, Oral, Once, Kefalas, Thomas S, PA-C   sodium chloride 0.9 % injection 10 mL, 10 mL, Intracatheter, PRN, Ellouise Newer, PA-C, 10 mL at 08/25/15 1125   Allergies: Allergies  Allergen Reactions   Meperidine Hcl Anaphylaxis   Montelukast Sodium Hives and Rash    REVIEW OF SYSTEMS:   Review of Systems  Constitutional:  Negative for chills, fatigue and fever.  HENT:   Negative for lump/mass, mouth sores, nosebleeds, sore throat and trouble swallowing.   Eyes:  Negative for eye problems.  Respiratory:  Negative for cough and shortness of breath.   Cardiovascular:  Negative for chest pain, leg swelling and palpitations.  Gastrointestinal:  Negative for abdominal pain, constipation, diarrhea, nausea and vomiting.  Genitourinary:  Negative for bladder incontinence, difficulty urinating, dysuria, frequency, hematuria and nocturia.   Musculoskeletal:  Negative for arthralgias, back pain, flank pain, myalgias and neck pain.  Skin:  Negative for itching and rash.  Neurological:  Negative for dizziness, headaches and numbness.  Hematological:  Does not bruise/bleed easily.  Psychiatric/Behavioral:  Negative for depression, sleep disturbance and suicidal ideas. The patient is not nervous/anxious.   All other systems reviewed and are negative.    VITALS:   There were no vitals taken  for this visit.  Wt Readings from Last 3 Encounters:  06/14/22 126 lb 3.2 oz (57.2 kg)  04/19/22 128 lb 6.4 oz (58.2 kg)  02/16/22 132 lb (59.9 kg)    There is no height or weight on file to calculate BMI.  Performance status (ECOG): 1 - Symptomatic but completely ambulatory  PHYSICAL EXAM:   Physical Exam Vitals and nursing note reviewed. Exam conducted with a chaperone present.  Constitutional:      Appearance: Normal appearance.  Cardiovascular:     Rate and Rhythm: Normal rate and regular rhythm.     Pulses: Normal pulses.     Heart sounds:  Normal heart sounds.  Pulmonary:     Effort: Pulmonary effort is normal.     Breath sounds: Normal breath sounds.  Abdominal:     Palpations: Abdomen is soft. There is no hepatomegaly, splenomegaly or mass.     Tenderness: There is no abdominal tenderness.  Musculoskeletal:     Right lower leg: No edema.     Left lower leg: No edema.  Lymphadenopathy:     Cervical: No cervical adenopathy.     Right cervical: No superficial, deep or posterior cervical adenopathy.    Left cervical: No superficial, deep or posterior cervical adenopathy.     Upper Body:     Right upper body: No supraclavicular or axillary adenopathy.     Left upper body: No supraclavicular or axillary adenopathy.  Neurological:     General: No focal deficit present.     Mental Status: She is alert and oriented to person, place, and time.  Psychiatric:        Mood and Affect: Mood normal.        Behavior: Behavior normal.     LABS:      Latest Ref Rng & Units 08/02/2022    8:54 AM 06/14/2022    8:00 AM 04/12/2022    8:58 AM  CBC  WBC 4.0 - 10.5 K/uL 6.7  8.3  5.0   Hemoglobin 12.0 - 15.0 g/dL 16.1  09.6  04.5   Hematocrit 36.0 - 46.0 % 36.0  37.4  38.8   Platelets 150 - 400 K/uL 171  167  164       Latest Ref Rng & Units 08/02/2022    8:54 AM 06/14/2022    8:00 AM 04/12/2022    8:58 AM  CMP  Glucose 70 - 99 mg/dL 87  409  90   BUN 8 - 23 mg/dL 11  16  11    Creatinine 0.44 - 1.00 mg/dL 8.11  9.14  7.82   Sodium 135 - 145 mmol/L 137  136  139   Potassium 3.5 - 5.1 mmol/L 3.9  3.7  4.5   Chloride 98 - 111 mmol/L 103  98  102   CO2 22 - 32 mmol/L 31  26  29    Calcium 8.9 - 10.3 mg/dL 8.4  8.7  8.6   Total Protein 6.5 - 8.1 g/dL 6.9  7.4  6.8   Total Bilirubin 0.3 - 1.2 mg/dL 0.4  0.6  0.7   Alkaline Phos 38 - 126 U/L 110  112  110   AST 15 - 41 U/L 14  20  15    ALT 0 - 44 U/L 11  13  9       No results found for: "CEA1", "CEA" / No results found for: "CEA1", "CEA" No results found for: "PSA1" No results  found for: "NFA213" No results  found for: "CAN125"  Lab Results  Component Value Date   TOTALPROTELP 6.3 02/11/2014   TOTALPROTELP 6.3 02/11/2014   ALBUMINELP 57.6 02/11/2014   A1GS 6.1 (H) 02/11/2014   A2GS 11.7 02/11/2014   BETS 6.4 02/11/2014   BETA2SER 5.3 02/11/2014   GAMS 12.9 02/11/2014   MSPIKE NOT DETECTED 02/11/2014   SPEI (NOTE) 02/11/2014   Lab Results  Component Value Date   TIBC 169 (L) 08/02/2022   TIBC 195 (L) 04/12/2022   TIBC 215 (L) 02/16/2022   FERRITIN 227 08/02/2022   FERRITIN 78 04/12/2022   FERRITIN 117 02/16/2022   IRONPCTSAT 44 (H) 08/02/2022   IRONPCTSAT 40 (H) 04/12/2022   IRONPCTSAT 27 02/16/2022   Lab Results  Component Value Date   LDH 145 08/02/2022   LDH 151 04/26/2021   LDH 186 11/02/2020     STUDIES:   No results found.

## 2022-08-09 ENCOUNTER — Inpatient Hospital Stay: Payer: Medicare Other

## 2022-08-09 ENCOUNTER — Inpatient Hospital Stay: Payer: Medicare Other | Attending: Hematology | Admitting: Hematology

## 2022-08-09 VITALS — BP 130/66 | HR 70 | Temp 97.8°F | Resp 16 | Wt 126.0 lb

## 2022-08-09 VITALS — BP 133/60 | HR 62 | Temp 96.6°F | Resp 20

## 2022-08-09 DIAGNOSIS — D801 Nonfamilial hypogammaglobulinemia: Secondary | ICD-10-CM

## 2022-08-09 DIAGNOSIS — C8338 Diffuse large B-cell lymphoma, lymph nodes of multiple sites: Secondary | ICD-10-CM

## 2022-08-09 DIAGNOSIS — C8330 Diffuse large B-cell lymphoma, unspecified site: Secondary | ICD-10-CM | POA: Insufficient documentation

## 2022-08-09 DIAGNOSIS — D508 Other iron deficiency anemias: Secondary | ICD-10-CM

## 2022-08-09 DIAGNOSIS — D509 Iron deficiency anemia, unspecified: Secondary | ICD-10-CM | POA: Diagnosis not present

## 2022-08-09 MED ORDER — IMMUNE GLOBULIN (HUMAN) 10 GM/100ML IV SOLN
30.0000 g | Freq: Once | INTRAVENOUS | Status: AC
  Administered 2022-08-09: 30 g via INTRAVENOUS
  Filled 2022-08-09: qty 300

## 2022-08-09 MED ORDER — ACETAMINOPHEN 325 MG PO TABS: 650.0000 mg | ORAL_TABLET | Freq: Four times a day (QID) | ORAL | Status: DC | PRN

## 2022-08-09 MED ORDER — DEXTROSE 5 % IV SOLN: INTRAVENOUS | Status: DC

## 2022-08-09 MED ORDER — DIPHENHYDRAMINE HCL 25 MG PO CAPS: 25.0000 mg | ORAL_CAPSULE | Freq: Once | ORAL | Status: DC

## 2022-08-09 NOTE — Patient Instructions (Signed)

## 2022-08-09 NOTE — Patient Instructions (Signed)
[facility]  Discharge Instructions: Thank you for choosing [facility] to provide your oncology and hematology care.  If you have a lab appointment with the Cancer Center - please note that after April 8th, 2024, all labs will be drawn in the cancer center.  You do not have to check in or register with the main entrance as you have in the past but will complete your check-in in the cancer center.  Wear comfortable clothing and clothing appropriate for easy access to any Portacath or PICC line.   We strive to give you quality time with your provider. You may need to reschedule your appointment if you arrive late (15 or more minutes).  Arriving late affects you and other patients whose appointments are after yours.  Also, if you miss three or more appointments without notifying the office, you may be dismissed from the clinic at the provider's discretion.      For prescription refill requests, have your pharmacy contact our office and allow 72 hours for refills to be completed.    Immune Globulin Injection What is this medication? IMMUNE GLOBULIN (im MUNE  GLOB yoo lin) helps to prevent or reduce the severity of certain infections in patients who are at risk. This medicine is collected from the pooled blood of many donors. It is used to treat immune system problems, thrombocytopenia, and Kawasaki syndrome. This medicine may be used for other purposes; ask your health care provider or pharmacist if you have questions. This medicine may be used for other purposes; ask your health care provider or pharmacist if you have questions. COMMON BRAND NAME(S): ASCENIV, Baygam, BIVIGAM, Carimune, Carimune NF, cutaquig, Cuvitru, Flebogamma, Flebogamma DIF, GamaSTAN, GamaSTAN S/D, Gamimune N, Gammagard, Gammagard S/D, Gammaked, Gammaplex, Gammar-P IV, Gamunex, Gamunex-C, Hizentra, Iveegam, Iveegam EN, Octagam, Panglobulin, Panglobulin NF, panzyga, Polygam S/D, Privigen,  Sandoglobulin, Venoglobulin-S, Vigam, Vivaglobulin, Xembify What should I tell my care team before I take this medication? They need to know if you have any of these conditions: diabetes extremely low or no immune antibodies in the blood heart disease history of blood clots hyperprolinemia infection in the blood, sepsis kidney disease recently received or scheduled to receive a vaccination an unusual or allergic reaction to human immune globulin, albumin, maltose, sucrose, other medicines, foods, dyes, or preservatives pregnant or trying to get pregnant breast-feeding How should I use this medication? This medicine is for injection into a muscle or infusion into a vein or skin. It is usually given by a health care professional in a hospital or clinic setting. In rare cases, some brands of this medicine might be given at home. You will be taught how to give this medicine. Use exactly as directed. Take your medicine at regular intervals. Do not take your medicine more often than directed. Talk to your pediatrician regarding the use of this medicine in children. While this drug may be prescribed for selected conditions, precautions do apply. Overdosage: If you think you have taken too much of this medicine contact a poison control center or emergency room at once. NOTE: This medicine is only for you. Do not share this medicine with others. Overdosage: If you think you have taken too much of this medicine contact a poison control center or emergency room at once. NOTE: This medicine is only for you. Do not share this medicine with others. What if I miss a dose? It is important not to miss your dose. Call your doctor or health care professional if  you are unable to keep an appointment. If you give yourself the medicine and you miss a dose, take it as soon as you can. If it is almost time for your next dose, take only that dose. Do not take double or extra doses. What may interact with this  medication? aspirin and aspirin-like medicines cisplatin cyclosporine medicines for infection like acyclovir, adefovir, amphotericin B, bacitracin, cidofovir, foscarnet, ganciclovir, gentamicin, pentamidine, vancomycin NSAIDS, medicines for pain and inflammation, like ibuprofen or naproxen pamidronate vaccines zoledronic acid This list may not describe all possible interactions. Give your health care provider a list of all the medicines, herbs, non-prescription drugs, or dietary supplements you use. Also tell them if you smoke, drink alcohol, or use illegal drugs. Some items may interact with your medicine. This list may not describe all possible interactions. Give your health care provider a list of all the medicines, herbs, non-prescription drugs, or dietary supplements you use. Also tell them if you smoke, drink alcohol, or use illegal drugs. Some items may interact with your medicine. What should I watch for while using this medication? Your condition will be monitored carefully while you are receiving this medicine. This medicine is made from pooled blood donations of many different people. It may be possible to pass an infection in this medicine. However, the donors are screened for infections and all products are tested for HIV and hepatitis. The medicine is treated to kill most or all bacteria and viruses. Talk to your doctor about the risks and benefits of this medicine. Do not have vaccinations for at least 14 days before, or until at least 3 months after receiving this medicine. What side effects may I notice from receiving this medication? Side effects that you should report to your doctor or health care professional as soon as possible: allergic reactions like skin rash, itching or hives, swelling of the face, lips, or tongue blue colored lips or skin breathing problems chest pain or tightness fever signs and symptoms of aseptic meningitis such as stiff neck; sensitivity to light;  headache; drowsiness; fever; nausea; vomiting; rash signs and symptoms of a blood clot such as chest pain; shortness of breath; pain, swelling, or warmth in the leg signs and symptoms of hemolytic anemia such as fast heartbeat; tiredness; dark yellow or brown urine; or yellowing of the eyes or skin signs and symptoms of kidney injury like trouble passing urine or change in the amount of urine sudden weight gain swelling of the ankles, feet, hands Side effects that usually do not require medical attention (report to your doctor or health care professional if they continue or are bothersome): diarrhea flushing headache increased sweating joint pain muscle cramps muscle pain nausea pain, redness, or irritation at site where injected tiredness This list may not describe all possible side effects. Call your doctor for medical advice about side effects. You may report side effects to FDA at 1-800-FDA-1088. This list may not describe all possible side effects. Call your doctor for medical advice about side effects. You may report side effects to FDA at 1-800-FDA-1088. Where should I keep my medication? Keep out of the reach of children. This drug is usually given in a hospital or clinic and will not be stored at home. In rare cases, some brands of this medicine may be given at home. If you are using this medicine at home, you will be instructed on how to store this medicine. Throw away any unused medicine after the expiration date on the label. NOTE: This  sheet is a summary. It may not cover all possible information. If you have questions about this medicine, talk to your doctor, pharmacist, or health care provider.  2024 Elsevier/Gold Standard (2018-07-25 00:00:00)    Ferumoxytol Injection What is this medication? FERUMOXYTOL (FER ue MOX i tol) treats low levels of iron in your body (iron deficiency anemia). Iron is a mineral that plays an important role in making red blood cells, which  carry oxygen from your lungs to the rest of your body. This medicine may be used for other purposes; ask your health care provider or pharmacist if you have questions. COMMON BRAND NAME(S): Feraheme What should I tell my care team before I take this medication? They need to know if you have any of these conditions: Anemia not caused by low iron levels High levels of iron in the blood Magnetic resonance imaging (MRI) test scheduled An unusual or allergic reaction to iron, other medications, foods, dyes, or preservatives Pregnant or trying to get pregnant Breastfeeding How should I use this medication? This medication is injected into a vein. It is given by your care team in a hospital or clinic setting. Talk to your care team the use of this medication in children. Special care may be needed. Overdosage: If you think you have taken too much of this medicine contact a poison control center or emergency room at once. NOTE: This medicine is only for you. Do not share this medicine with others. What if I miss a dose? It is important not to miss your dose. Call your care team if you are unable to keep an appointment. What may interact with this medication? Other iron products This list may not describe all possible interactions. Give your health care provider a list of all the medicines, herbs, non-prescription drugs, or dietary supplements you use. Also tell them if you smoke, drink alcohol, or use illegal drugs. Some items may interact with your medicine. What should I watch for while using this medication? Visit your care team regularly. Tell your care team if your symptoms do not start to get better or if they get worse. You may need blood work done while you are taking this medication. You may need to follow a special diet. Talk to your care team. Foods that contain iron include: whole grains/cereals, dried fruits, beans, or peas, leafy green vegetables, and organ meats (liver, kidney). What  side effects may I notice from receiving this medication? Side effects that you should report to your care team as soon as possible: Allergic reactions--skin rash, itching, hives, swelling of the face, lips, tongue, or throat Low blood pressure--dizziness, feeling faint or lightheaded, blurry vision Shortness of breath Side effects that usually do not require medical attention (report to your care team if they continue or are bothersome): Flushing Headache Joint pain Muscle pain Nausea Pain, redness, or irritation at injection site This list may not describe all possible side effects. Call your doctor for medical advice about side effects. You may report side effects to FDA at 1-800-FDA-1088. Where should I keep my medication? This medication is given in a hospital or clinic and will not be stored at home. NOTE: This sheet is a summary. It may not cover all possible information. If you have questions about this medicine, talk to your doctor, pharmacist, or health care provider.  2024 Elsevier/Gold Standard (2021-06-28 00:00:00)    To help prevent nausea and vomiting after your treatment, we encourage you to take your nausea medication as directed.  BELOW ARE SYMPTOMS THAT SHOULD BE REPORTED IMMEDIATELY: *FEVER GREATER THAN 100.4 F (38 C) OR HIGHER *CHILLS OR SWEATING *NAUSEA AND VOMITING THAT IS NOT CONTROLLED WITH YOUR NAUSEA MEDICATION *UNUSUAL SHORTNESS OF BREATH *UNUSUAL BRUISING OR BLEEDING *URINARY PROBLEMS (pain or burning when urinating, or frequent urination) *BOWEL PROBLEMS (unusual diarrhea, constipation, pain near the anus) TENDERNESS IN MOUTH AND THROAT WITH OR WITHOUT PRESENCE OF ULCERS (sore throat, sores in mouth, or a toothache) UNUSUAL RASH, SWELLING OR PAIN  UNUSUAL VAGINAL DISCHARGE OR ITCHING   Items with * indicate a potential emergency and should be followed up as soon as possible or go to the Emergency Department if any problems should occur.  Please show  the CHEMOTHERAPY ALERT CARD or IMMUNOTHERAPY ALERT CARD at check-in to the Emergency Department and triage nurse.  Should you have questions after your visit or need to cancel or reschedule your appointment, please contact [facility] [phone]  and follow the prompts.  Office hours are 8:00 a.m. to 4:30 p.m. Monday - Friday. Please note that voicemails left after 4:00 p.m. may not be returned until the following business day.  We are closed weekends and major holidays. You have access to a nurse at all times for urgent questions. Please call the main number to the clinic [phone] and follow the prompts.  For any non-urgent questions, you may also contact your provider using MyChart. We now offer e-Visits for anyone 51 and older to request care online for non-urgent symptoms. For details visit [email or web address].   Also download the MyChart app! Go to the app store, search "MyChart", open the app, select , and log in with your MyChart username and password.

## 2022-08-09 NOTE — Progress Notes (Signed)
During the infusion of the second, smaller vial of Privigen the line starting pulling from the primary bag instead of the medication vial. Infusion required to be restarted to complete remaining amount in the mini vial.

## 2022-08-10 ENCOUNTER — Ambulatory Visit (HOSPITAL_COMMUNITY): Payer: Medicare Other | Attending: Family Medicine | Admitting: Physical Therapy

## 2022-08-10 DIAGNOSIS — M5412 Radiculopathy, cervical region: Secondary | ICD-10-CM | POA: Diagnosis not present

## 2022-08-10 DIAGNOSIS — M6281 Muscle weakness (generalized): Secondary | ICD-10-CM | POA: Diagnosis not present

## 2022-08-10 NOTE — Therapy (Signed)
OUTPATIENT PHYSICAL THERAPY CERVICAL Treatment    Patient Name: [name] MRN: [id number] DOB:[birth year], 67 y.o., female Today's Date: 08/10/2022  END OF SESSION:  PT End of Session - 08/03/22 1725     Visit Number 2    Number of Visits 12    Date for PT Re-Evaluation 09/07/22    Authorization Type [employer] medicare    PT Start Time 1645    PT Stop Time 1725    PT Time Calculation (min) 40 min    Activity Tolerance Patient tolerated treatment well    Behavior During Therapy [facility] for tasks assessed/performed           Past Medical History:  Diagnosis Date   Allergic rhinitis    Anxiety    Asthma    B12 deficiency 01/18/2015   Cataract    Cavitary lung disease    Chronic sinusitis    COPD (chronic obstructive pulmonary disease) (HCC)    Depression    Diffuse large B-cell lymphoma (HCC)    DVT (deep venous thrombosis) (HCC) 09/25/2014   Essential hypertension    GERD (gastroesophageal reflux disease)    Hematuria 04/07/2016   History of pneumonia 01/2013   Hyperlipidemia    Hypogammaglobulinemia, acquired (HCC)    Iron deficiency anemia 01/18/2015   Myocardial infarction (HCC) 2011   Narcolepsy without cataplexy(347.00) 01/18/2015   Orofacial dyskinesia 04/22/2015   Osteoporosis    Peripheral neuropathy due to chemotherapy (HCC) 05/02/2016   Sleep apnea    SVC syndrome 09/25/2014   Past Surgical History:  Procedure Laterality Date   ABDOMINAL HYSTERECTOMY     Fibroids   BASAL CELL CARCINOMA EXCISION  03/2011   scalp   BREAST SURGERY Right    biopsy   CATARACT EXTRACTION W/PHACO Left 11/17/2014   Procedure: CATARACT EXTRACTION PHACO AND INTRAOCULAR LENS PLACEMENT LEFT EYE;  Surgeon: Gemma Payor, MD;  Location: [facility];  Service: Ophthalmology;  Laterality: Left;  CDE:5.60   CATARACT EXTRACTION W/PHACO Right 12/15/2014   Procedure: CATARACT EXTRACTION PHACO AND INTRAOCULAR LENS PLACEMENT RIGHT EYE CDE=5.16;  Surgeon: Gemma Payor, MD;  Location: [facility];  Service:  Ophthalmology;  Laterality: Right;   COLONOSCOPY N/A 11/14/2012   Procedure: COLONOSCOPY;  Surgeon: Malissa Hippo, MD;  Location: [facility];  Service: Endoscopy;  Laterality: N/A;  830   COLONOSCOPY WITH PROPOFOL N/A 01/12/2018   Procedure: COLONOSCOPY WITH PROPOFOL;  Surgeon: Malissa Hippo, MD;  Location: [facility];  Service: Endoscopy;  Laterality: N/A;  7:30   EYE SURGERY     NASAL SINUS SURGERY     NECK SURGERY     x 2    PERIPHERALLY INSERTED CENTRAL CATHETER INSERTION     PICC Removal     POLYPECTOMY  01/12/2018   Procedure: POLYPECTOMY;  Surgeon: Malissa Hippo, MD;  Location: [facility];  Service: Endoscopy;;  colon   PORT-A-CATH REMOVAL     PORTACATH PLACEMENT  7/11   Removed 6/12   TRACHEOSTOMY     VESICOVAGINAL FISTULA CLOSURE W/ TAH     Patient Active Problem List   Diagnosis Date Noted   Hx of colonic polyps 12/13/2017   Peripheral neuropathy due to chemotherapy (HCC) 05/02/2016   Recurrent herpes labialis 05/02/2016   Osteoporosis 04/30/2015   Narcolepsy without cataplexy(347.00) 01/18/2015   Iron deficiency anemia 01/18/2015   B12 deficiency 01/18/2015   SVC syndrome 09/25/2014   Sinusitis, chronic 05/06/2014   Abnormal finding on imaging 02/11/2014   OSA (obstructive  sleep apnea) 09/10/2012   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) 02/15/2012   Hypogammaglobulinemia, acquired (HCC) 12/30/2009   Non Hodgkin's lymphoma (HCC) 09/09/2009   HLD (hyperlipidemia) 04/21/2007   Essential hypertension 04/21/2007   DIABETES MELLITUS, BORDERLINE 04/21/2007   Moderate persistent asthma 04/03/2007   Allergic rhinitis 10/19/2006   VOCAL CORD DISORDER 10/19/2006   GERD 10/19/2006   GASTRIC POLYP 07/25/2006    PCP: Assunta Found   REFERRING PROVIDER: Tressie Stalker, MD  REFERRING DIAG:  Diagnosis  M43.12 (ICD-10-CM) - Spondylolisthesis, cervical region    THERAPY DIAG:  Cervicalgia   Rationale for Evaluation and Treatment:  Rehabilitation  ONSET DATE: chronic;                                                                                                                                                                                                           SUBJECTIVE STATEMENT: Pt has been doing her exercises she continues to be a little sore . PERTINENT HISTORY:  hypogammaglobulinemia and large B-cell lymphoma  PAIN:  Are you having pain? Yes: NPRS scale: 2/10;  worst past week 8/10  Pain location: upper trap area B  Pain description: burning  Aggravating factors: vacuuming  Relieving factors: heat and rubs.   PRECAUTIONS: cervical fusion     WEIGHT BEARING RESTRICTIONS: No  FALLS:  Has patient fallen in last 6 months? No  LIVING ENVIRONMENT: Lives with: lives alone Lives in: House/apartment OCCUPATION: retired  PLOF: Independent  PATIENT GOALS: less pain. To have better posture   NEXT MD VISIT: unsure  OBJECTIVE:   DIAGNOSTIC FINDINGS:  IMPRESSION: 1. Chronic C5 through C7 fusion with solid arthrodesis and no adverse features.   2. Progressed adjacent segment disease at C4-C5 since 2012 with mild new anterolisthesis, moderate disc and posterior element degeneration. Mild spinal stenosis AND spinal cord mass effect. No cord mass effect. Moderate to severe bilateral foraminal stenosis. Query C5 radiculitis.   3. Mild new anterolisthesis also at the C7-T1 adjacent segment but no associated stenosis.   4. Chronic upper thoracic compression fractures.     Electronically Signed   By: Odessa Fleming M.D.  PATIENT SURVEYS:  FOTO 57  COGNITION: Overall cognitive status: Within functional limits for tasks assessed   POSTURE: rounded shoulders, forward head, decreased lumbar lordosis, increased thoracic kyphosis, and flexed trunk   PALPATION: Extremely tight trap mm B    CERVICAL ROM: (3 surgeries with fusion)  Active ROM A/PROM (deg) eval  Flexion 22  Extension 10  Right  lateral flexion   Left lateral  flexion   Right rotation 30  Left rotation 38   (Blank rows = not tested) Cervical mm:  Rt SB: 3; LT 3-: extension 3- UPPER EXTREMITY ROM: WFL  UPPER EXTREMITY MMT:  MMT Right eval Left eval  Shoulder flexion    Shoulder extension    Shoulder abduction    Shoulder adduction    Shoulder extension    Shoulder internal rotation    Shoulder external rotation    Middle trapezius    Lower trapezius    Elbow flexion    Elbow extension    Wrist flexion    Wrist extension    Wrist ulnar deviation    Wrist radial deviation    Wrist pronation    Wrist supination    Grip strength 15 27   (Blank rows = not tested)  TODAY'S TREATMENT:                                                                                                                              DATE: 08/10/22: Manual to improve ROM and decrease spasm and Pain with HMP Cervical rotation x 10 Isometric SB x 10 Isometric cervical retraction x 10 Scapular retraction x 10 Sitting : Cervical excursion x 3 Tall posture with scapular retraction x 10 W back with 2 # x 10  Shoulder rolls x 10  UBE: level 1 x 3 minutes Standing attempting to get into better posture.(Occipital to wall test) x10   08/03/22: Supine: Manual to improve ROM and decrease spasm and Pain Cervical rotation x 5 Isometric SB x 5 Isometric cervical retraction x 5 Scapular retraction x 5 Sitting : Cervical excursion x 3 Tall posture with scapular retraction x 10 W back x 10  Shoulder rolls x 10  UBE: level 1 x 3 minutes Standing attempting to get into better posture.(Occipital to wall test)   Evaluation: 7;24/24   Correct Seated Posture  5-hold 5" - Seated Scapular Retraction  10 reps - 5" hold - Seated Cervical Retraction  10 reps - 5 hold Hand grip with putty B x 10   PATIENT EDUCATION:  Education details: HEP Person educated: Patient Education method: Explanation and Handouts Education comprehension:  verbalized understanding  HOME EXERCISE PROGRAM: Access Code: [email or web address] URL: [email or web address]  08/03/22 - Seated Scapular Retraction with External Rotation  - 3 x daily - 7 x weekly - 1 sets - 10 reps - 3" hold - Seated Shoulder Shrug Circles AROM Backward  - 3 x daily - 7 x weekly - 1 sets - 10 reps Cervical excursions Date: 07/27/2022 Prepared by: Virgina Organ  Exercises - Correct Seated Posture  - 4 x daily - 7 x weekly - 1 sets - 5-10 reps - Seated Scapular Retraction  - 4 x daily - 7 x weekly - 1 sets - 5-10 reps - 3-5" hold - Seated Cervical Retraction  - 4 x daily - 7 x weekly - 1 sets - 10 reps -  3-5 hold Hand grip with putty  ASSESSMENT:  CLINICAL IMPRESSION:Treatment continued to focus on improving ROM, posture and strength.  Added wt and reps to exercises.  Manual completed to improve ROM, decrease tightness and pain.   Patient is a 67 y.o. female who was seen today for physical therapy evaluation and treatment for cervcial pain secondary to spondylolisthesis.  Evaluation demonstrates decreased strength, decreased ROM, postural dysfunction, mm spasms  and increased pain.  Ms. Walkenhorst will benefit from skilled PT to address these issues and improve the pt functioning ability.  OBJECTIVE IMPAIRMENTS: decreased activity tolerance, decreased ROM, decreased strength, increased fascial restrictions, impaired perceived functional ability, postural dysfunction, and pain.   ACTIVITY LIMITATIONS: carrying, lifting, and reach over head  PARTICIPATION LIMITATIONS: cleaning, community activity, and yard work  PERSONAL FACTORS: Past/current experiences and Time since onset of injury/illness/exacerbation are also affecting patient's functional outcome.   REHAB POTENTIAL: Good  CLINICAL DECISION MAKING: Evolving/moderate complexity  EVALUATION COMPLEXITY: Moderate   GOALS: Goals reviewed with patient? No  SHORT TERM GOALS: Target date: 08/17/22  PT to be I in  HEP to decrease her pain to no greater than a 5/10 Baseline:  Goal status: on-going   2.  Pt to increase her ROM by 10 degrees to allow improved scanning of area  Baseline:  Goal status: on-going   3.  PT to feel her numbness has decreased by 25% Baseline:  Goal status: on-going     LONG TERM GOALS: Target date: 09/07/22  PT to be I in HEP to decrease her pain to no greater than a 3/10 Baseline:  Goal status: on-going   2.  Pt to increase her ROM by 20 degrees to allow safer driving   Baseline:  Goal status: on-going   3.  PT to feel her numbness has decreased by 50% Baseline:  Goal status: on-going    PLAN:  PT FREQUENCY: 2x/week  PT DURATION: 6 weeks  PLANNED INTERVENTIONS: Therapeutic exercises, Therapeutic activity, Patient/Family education, Self Care, Joint mobilization, and Manual therapy  PLAN FOR NEXT SESSION: measure occipital to wall to have objective measurement.  Continue with manual and postural, isometric cervical while supine for better cervical positioning.   Virgina Organ, PT CLT [phone]  08/10/2022, 4:46 PM

## 2022-08-16 ENCOUNTER — Encounter (HOSPITAL_COMMUNITY): Payer: Medicare Other | Admitting: Physical Therapy

## 2022-08-18 ENCOUNTER — Encounter (HOSPITAL_COMMUNITY): Payer: Medicare Other | Admitting: Physical Therapy

## 2022-08-23 ENCOUNTER — Encounter (HOSPITAL_COMMUNITY): Payer: Medicare Other | Admitting: Physical Therapy

## 2022-08-25 ENCOUNTER — Encounter (HOSPITAL_COMMUNITY): Payer: Medicare Other | Admitting: Physical Therapy

## 2022-08-30 ENCOUNTER — Encounter (HOSPITAL_COMMUNITY): Payer: Medicare Other | Admitting: Physical Therapy

## 2022-08-31 ENCOUNTER — Ambulatory Visit (HOSPITAL_COMMUNITY): Payer: Medicare Other | Admitting: Physical Therapy

## 2022-08-31 DIAGNOSIS — M6281 Muscle weakness (generalized): Secondary | ICD-10-CM | POA: Diagnosis not present

## 2022-08-31 DIAGNOSIS — M5412 Radiculopathy, cervical region: Secondary | ICD-10-CM | POA: Diagnosis not present

## 2022-08-31 NOTE — Therapy (Signed)
OUTPATIENT PHYSICAL THERAPY CERVICAL Treatment    Patient Name: [name] MRN: [id number] DOB:[birth year], 67 y.o., female Today's Date: 08/31/2022  END OF SESSION:  PT End of Session - 08/03/22 1725     Visit Number 2    Number of Visits 12    Date for PT Re-Evaluation 09/07/22    Authorization Type BCBS medicare    PT Start Time 1645    PT Stop Time 1725    PT Time Calculation (min) 40 min    Activity Tolerance Patient tolerated treatment well    Behavior During Therapy [facility] for tasks assessed/performed           Past Medical History:  Diagnosis Date   Allergic rhinitis    Anxiety    Asthma    B12 deficiency 01/18/2015   Cataract    Cavitary lung disease    Chronic sinusitis    COPD (chronic obstructive pulmonary disease) (HCC)    Depression    Diffuse large B-cell lymphoma (HCC)    DVT (deep venous thrombosis) (HCC) 09/25/2014   Essential hypertension    GERD (gastroesophageal reflux disease)    Hematuria 04/07/2016   History of pneumonia 01/2013   Hyperlipidemia    Hypogammaglobulinemia, acquired (HCC)    Iron deficiency anemia 01/18/2015   Myocardial infarction (HCC) 2011   Narcolepsy without cataplexy(347.00) 01/18/2015   Orofacial dyskinesia 04/22/2015   Osteoporosis    Peripheral neuropathy due to chemotherapy (HCC) 05/02/2016   Sleep apnea    SVC syndrome 09/25/2014   Past Surgical History:  Procedure Laterality Date   ABDOMINAL HYSTERECTOMY     Fibroids   BASAL CELL CARCINOMA EXCISION  03/2011   scalp   BREAST SURGERY Right    biopsy   CATARACT EXTRACTION W/PHACO Left 11/17/2014   Procedure: CATARACT EXTRACTION PHACO AND INTRAOCULAR LENS PLACEMENT LEFT EYE;  Surgeon: Gemma Payor, MD;  Location: [facility];  Service: Ophthalmology;  Laterality: Left;  CDE:5.60   CATARACT EXTRACTION W/PHACO Right 12/15/2014   Procedure: CATARACT EXTRACTION PHACO AND INTRAOCULAR LENS PLACEMENT RIGHT EYE CDE=5.16;  Surgeon: Gemma Payor, MD;  Location: [facility];  Service:  Ophthalmology;  Laterality: Right;   COLONOSCOPY N/A 11/14/2012   Procedure: COLONOSCOPY;  Surgeon: Malissa Hippo, MD;  Location: [facility];  Service: Endoscopy;  Laterality: N/A;  830   COLONOSCOPY WITH PROPOFOL N/A 01/12/2018   Procedure: COLONOSCOPY WITH PROPOFOL;  Surgeon: Malissa Hippo, MD;  Location: [facility];  Service: Endoscopy;  Laterality: N/A;  7:30   EYE SURGERY     NASAL SINUS SURGERY     NECK SURGERY     x 2    PERIPHERALLY INSERTED CENTRAL CATHETER INSERTION     PICC Removal     POLYPECTOMY  01/12/2018   Procedure: POLYPECTOMY;  Surgeon: Malissa Hippo, MD;  Location: [facility];  Service: Endoscopy;;  colon   PORT-A-CATH REMOVAL     PORTACATH PLACEMENT  7/11   Removed 6/12   TRACHEOSTOMY     VESICOVAGINAL FISTULA CLOSURE W/ TAH     Patient Active Problem List   Diagnosis Date Noted   Hx of colonic polyps 12/13/2017   Peripheral neuropathy due to chemotherapy (HCC) 05/02/2016   Recurrent herpes labialis 05/02/2016   Osteoporosis 04/30/2015   Narcolepsy without cataplexy(347.00) 01/18/2015   Iron deficiency anemia 01/18/2015   B12 deficiency 01/18/2015   SVC syndrome 09/25/2014   Sinusitis, chronic 05/06/2014   Abnormal finding on imaging 02/11/2014   OSA (obstructive  sleep apnea) 09/10/2012   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) 02/15/2012   Hypogammaglobulinemia, acquired (HCC) 12/30/2009   Non Hodgkin's lymphoma (HCC) 09/09/2009   HLD (hyperlipidemia) 04/21/2007   Essential hypertension 04/21/2007   DIABETES MELLITUS, BORDERLINE 04/21/2007   Moderate persistent asthma 04/03/2007   Allergic rhinitis 10/19/2006   VOCAL CORD DISORDER 10/19/2006   GERD 10/19/2006   GASTRIC POLYP 07/25/2006    PCP: Assunta Found   REFERRING PROVIDER: Tressie Stalker, MD  REFERRING DIAG:  Diagnosis  M43.12 (ICD-10-CM) - Spondylolisthesis, cervical region    THERAPY DIAG:  Cervicalgia   Rationale for Evaluation and Treatment:  Rehabilitation  ONSET DATE: chronic;                                                                                                                                                                                                           SUBJECTIVE STATEMENT: Pt states that she has no pain today PERTINENT HISTORY:  hypogammaglobulinemia and large B-cell lymphoma  PAIN:  Are you having pain? Yes: NPRS scale: 0/10;  worst past week 8/10  Pain location: upper trap area B  Pain description: burning  Aggravating factors: vacuuming  Relieving factors: heat and rubs.   PRECAUTIONS: cervical fusion     WEIGHT BEARING RESTRICTIONS: No  FALLS:  Has patient fallen in last 6 months? No  LIVING ENVIRONMENT: Lives with: lives alone Lives in: House/apartment OCCUPATION: retired  PLOF: Independent  PATIENT GOALS: less pain. To have better posture   NEXT MD VISIT: unsure  OBJECTIVE:   DIAGNOSTIC FINDINGS:  IMPRESSION: 1. Chronic C5 through C7 fusion with solid arthrodesis and no adverse features.   2. Progressed adjacent segment disease at C4-C5 since 2012 with mild new anterolisthesis, moderate disc and posterior element degeneration. Mild spinal stenosis AND spinal cord mass effect. No cord mass effect. Moderate to severe bilateral foraminal stenosis. Query C5 radiculitis.   3. Mild new anterolisthesis also at the C7-T1 adjacent segment but no associated stenosis.   4. Chronic upper thoracic compression fractures.     Electronically Signed   By: Odessa Fleming M.D.  PATIENT SURVEYS:  FOTO 57  COGNITION: Overall cognitive status: Within functional limits for tasks assessed   POSTURE: rounded shoulders, forward head, decreased lumbar lordosis, increased thoracic kyphosis, and flexed trunk   PALPATION: Extremely tight trap mm B    CERVICAL ROM: (3 surgeries with fusion)  Active ROM A/PROM (deg) eval  Flexion 22  Extension 10  Right lateral flexion   Left lateral  flexion   Right rotation 30  Left rotation 38   (Blank rows = not tested) Cervical mm:  Rt SB: 3; LT 3-: extension 3- UPPER EXTREMITY ROM: WFL  UPPER EXTREMITY MMT:  MMT Right eval Left eval  Shoulder flexion    Shoulder extension    Shoulder abduction    Shoulder adduction    Shoulder extension    Shoulder internal rotation    Shoulder external rotation    Middle trapezius    Lower trapezius    Elbow flexion    Elbow extension    Wrist flexion    Wrist extension    Wrist ulnar deviation    Wrist radial deviation    Wrist pronation    Wrist supination    Grip strength 15 27   (Blank rows = not tested)  TODAY'S TREATMENT:                                                                                                                              DATE: 08/31/22 Supine:  Postural decompression exercises with theraband:The overhead, the side pull, sash and ER x 5 each Cervical PROM, traction and jt mobs to improve motin Cervical isometric x 10 for  B and extension  Cervical rotation  Sitting:  thoracic extension x 10  08/10/22: Manual to improve ROM and decrease spasm and Pain with HMP Cervical rotation x 10 Isometric SB x 10 Isometric cervical retraction x 10 Scapular retraction x 10 Sitting : Cervical excursion x 3 Tall posture with scapular retraction x 10 W back with 2 # x 10  Shoulder rolls x 10  UBE: level 1 x 3 minutes Standing attempting to get into better posture.(Occipital to wall test) x10   08/03/22: Supine: Manual to improve ROM and decrease spasm and Pain Cervical rotation x 5 Isometric SB x 5 Isometric cervical retraction x 5 Scapular retraction x 5 Sitting : Cervical excursion x 3 Tall posture with scapular retraction x 10 W back x 10  Shoulder rolls x 10  UBE: level 1 x 3 minutes Standing attempting to get into better posture.(Occipital to wall test)   Evaluation: 7;24/24   Correct Seated Posture  5-hold 5" - Seated Scapular Retraction   10 reps - 5" hold - Seated Cervical Retraction  10 reps - 5 hold Hand grip with putty B x 10   PATIENT EDUCATION:  Education details: HEP Person educated: Patient Education method: Explanation and Handouts Education comprehension: verbalized understanding  HOME EXERCISE PROGRAM: Access Code: [email or web address] URL: [email or web address] 08/31/22 Decompression with theraband with red tband.  08/03/22 - Seated Scapular Retraction with External Rotation  - 3 x daily - 7 x weekly - 1 sets - 10 reps - 3" hold - Seated Shoulder Shrug Circles AROM Backward  - 3 x daily - 7 x weekly - 1 sets - 10 reps Cervical excursions Date: 07/27/2022 Prepared by: Virgina Organ  Exercises - Correct Seated Posture  - 4 x daily - 7 x weekly -  1 sets - 5-10 reps - Seated Scapular Retraction  - 4 x daily - 7 x weekly - 1 sets - 5-10 reps - 3-5" hold - Seated Cervical Retraction  - 4 x daily - 7 x weekly - 1 sets - 10 reps - 3-5 hold Hand grip with putty  ASSESSMENT:  CLINICAL IMPRESSION:Pt is 11 cm away from wall with occipital to wall.  Therapist began theraband decompressive exercises with these given as a HEP. Pt pain has decreased but continues to have significant postural dysfunction, decreased strength, decreased ROM  and mm spasms and will continue to benefit from skilled therapy.  OBJECTIVE IMPAIRMENTS: decreased activity tolerance, decreased ROM, decreased strength, increased fascial restrictions, impaired perceived functional ability, postural dysfunction, and pain.   ACTIVITY LIMITATIONS: carrying, lifting, and reach over head  PARTICIPATION LIMITATIONS: cleaning, community activity, and yard work  PERSONAL FACTORS: Past/current experiences and Time since onset of injury/illness/exacerbation are also affecting patient's functional outcome.   REHAB POTENTIAL: Good  CLINICAL DECISION MAKING: Evolving/moderate complexity  EVALUATION COMPLEXITY: Moderate   GOALS: Goals reviewed with  patient? No  SHORT TERM GOALS: Target date: 08/17/22  PT to be I in HEP to decrease her pain to no greater than a 5/10 Baseline:  Goal status: on-going   2.  Pt to increase her ROM by 10 degrees to allow improved scanning of area  Baseline:  Goal status: on-going   3.  PT to feel her numbness has decreased by 25% Baseline:  Goal status: on-going     LONG TERM GOALS: Target date: 09/07/22  PT to be I in HEP to decrease her pain to no greater than a 3/10 Baseline:  Goal status: on-going   2.  Pt to increase her ROM by 20 degrees to allow safer driving   Baseline:  Goal status: on-going   3.  PT to feel her numbness has decreased by 50% Baseline:  Goal status: on-going    PLAN:  PT FREQUENCY: 2x/week  PT DURATION: 6 weeks  PLANNED INTERVENTIONS: Therapeutic exercises, Therapeutic activity, Patient/Family education, Self Care, Joint mobilization, and Manual therapy  PLAN FOR NEXT SESSION: measure occipital to wall to have objective measurement.  Continue with manual and postural, isometric cervical while supine for better cervical positioning.   Virgina Organ, PT CLT [phone]  08/31/2022, 6:19 PM

## 2022-09-01 ENCOUNTER — Encounter (HOSPITAL_COMMUNITY): Payer: Medicare Other | Admitting: Physical Therapy

## 2022-09-01 ENCOUNTER — Ambulatory Visit (HOSPITAL_COMMUNITY): Payer: Medicare Other

## 2022-09-01 DIAGNOSIS — M6281 Muscle weakness (generalized): Secondary | ICD-10-CM

## 2022-09-01 DIAGNOSIS — M5412 Radiculopathy, cervical region: Secondary | ICD-10-CM

## 2022-09-01 NOTE — Therapy (Addendum)
OUTPATIENT PHYSICAL THERAPY CERVICAL Treatment    Patient Name: [name] MRN: [id number] DOB:Nov 24, 1955, 67 y.o., female Today's Date: 09/01/2022   END OF SESSION:   PT End of Session - 09/01/22 0740     Visit Number 5    Number of Visits 12    Date for PT Re-Evaluation 09/07/22    Authorization Type [employer]BS medicare    PT Start Time 0735    PT Stop Time 0815    PT Time Calculation (min) 40 min    Activity Tolerance Patient tolerated treatment well    Behavior During Therapy [facility] for tasks assessed/performed             Past Medical History:  Diagnosis Date   Allergic rhinitis    Anxiety    Asthma    B12 deficiency 01/18/2015   Cataract    Cavitary lung disease    Chronic sinusitis    COPD (chronic obstructive pulmonary disease) (HCC)    Depression    Diffuse large B-cell lymphoma (HCC)    DVT (deep venous thrombosis) (HCC) 09/25/2014   Essential hypertension    GERD (gastroesophageal reflux disease)    Hematuria 04/07/2016   History of pneumonia 01/2013   Hyperlipidemia    Hypogammaglobulinemia, acquired (HCC)    Iron deficiency anemia 01/18/2015   Myocardial infarction (HCC) 2011   Narcolepsy without cataplexy(347.00) 01/18/2015   Orofacial dyskinesia 04/22/2015   Osteoporosis    Peripheral neuropathy due to chemotherapy (HCC) 05/02/2016   Sleep apnea    SVC syndrome 09/25/2014   Past Surgical History:  Procedure Laterality Date   ABDOMINAL HYSTERECTOMY     Fibroids   BASAL CELL CARCINOMA EXCISION  03/2011   scalp   BREAST SURGERY Right    biopsy   CATARACT EXTRACTION W/PHACO Left 11/17/2014   Procedure: CATARACT EXTRACTION PHACO AND INTRAOCULAR LENS PLACEMENT LEFT EYE;  Surgeon: Gemma Payor, MD;  Location: [facility];  Service: Ophthalmology;  Laterality: Left;  CDE:5.60   CATARACT EXTRACTION W/PHACO Right 12/15/2014   Procedure: CATARACT EXTRACTION PHACO AND INTRAOCULAR LENS PLACEMENT RIGHT EYE CDE=5.16;  Surgeon: Gemma Payor, MD;  Location: [facility];  Service:  Ophthalmology;  Laterality: Right;   COLONOSCOPY N/A 11/14/2012   Procedure: COLONOSCOPY;  Surgeon: Malissa Hippo, MD;  [facility];  Service: Endoscopy;  Laterality: N/A;  830   COLONOSCOPY WITH PROPOFOL N/A 01/12/2018   Procedure: COLONOSCOPY WITH PROPOFOL;  Surgeon: Malissa Hippo, MD;  [facility];  Service: Endoscopy;  Laterality: N/A;  7:30   EYE SURGERY     NASAL SINUS SURGERY     NECK SURGERY     x 2    PERIPHERALLY INSERTED CENTRAL CATHETER INSERTION     PICC Removal     POLYPECTOMY  01/12/2018   Procedure: POLYPECTOMY;  Surgeon: Malissa Hippo, MD;  [facility];  Service: Endoscopy;;  colon   PORT-A-CATH REMOVAL     PORTACATH PLACEMENT  7/11   Removed 6/12   TRACHEOSTOMY     VESICOVAGINAL FISTULA CLOSURE W/ TAH     Patient Active Problem List   Diagnosis Date Noted   Hx of colonic polyps 12/13/2017   Peripheral neuropathy due to chemotherapy (HCC) 05/02/2016   Recurrent herpes labialis 05/02/2016   Osteoporosis 04/30/2015   Narcolepsy without cataplexy(347.00) 01/18/2015   Iron deficiency anemia 01/18/2015   B12 deficiency 01/18/2015   SVC syndrome 09/25/2014   Sinusitis, chronic 05/06/2014   Abnormal finding on imaging 02/11/2014  OSA (obstructive sleep apnea) 09/10/2012   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) 02/15/2012   Hypogammaglobulinemia, acquired (HCC) 12/30/2009   Non Hodgkin's lymphoma (HCC) 09/09/2009   HLD (hyperlipidemia) 04/21/2007   Essential hypertension 04/21/2007   DIABETES MELLITUS, BORDERLINE 04/21/2007   Moderate persistent asthma 04/03/2007   Allergic rhinitis 10/19/2006   VOCAL CORD DISORDER 10/19/2006   GERD 10/19/2006   GASTRIC POLYP 07/25/2006    PCP: Assunta Found   REFERRING PROVIDER: Tressie Stalker, MD  REFERRING DIAG:  Diagnosis  M43.12 (ICD-10-CM) - Spondylolisthesis, cervical region    THERAPY DIAG:  Cervicalgia   Rationale for Evaluation and Treatment:  Rehabilitation  ONSET DATE: chronic;                                                                                                                                                                                                           SUBJECTIVE STATEMENT: Pt states that she is sore today after yesterday's session (1/10). Denies shooting pain down the UE.  PERTINENT HISTORY:  hypogammaglobulinemia and large B-cell lymphoma  PAIN:  Are you having pain? Yes: NPRS scale: 0/10;  worst past week 8/10  Pain location: upper trap area B  Pain description: burning  Aggravating factors: vacuuming  Relieving factors: heat and rubs.   PRECAUTIONS: cervical fusion     WEIGHT BEARING RESTRICTIONS: No  FALLS:  Has patient fallen in last 6 months? No  LIVING ENVIRONMENT: Lives with: lives alone Lives in: House/apartment OCCUPATION: retired  PLOF: Independent  PATIENT GOALS: less pain. To have better posture   NEXT MD VISIT: unsure  OBJECTIVE:   DIAGNOSTIC FINDINGS:  IMPRESSION: 1. Chronic C5 through C7 fusion with solid arthrodesis and no adverse features.   2. Progressed adjacent segment disease at C4-C5 since 2012 with mild new anterolisthesis, moderate disc and posterior element degeneration. Mild spinal stenosis AND spinal cord mass effect. No cord mass effect. Moderate to severe bilateral foraminal stenosis. Query C5 radiculitis.   3. Mild new anterolisthesis also at the C7-T1 adjacent segment but no associated stenosis.   4. Chronic upper thoracic compression fractures.     Electronically Signed   By: Odessa Fleming M.D.  PATIENT SURVEYS:  FOTO 57  COGNITION: Overall cognitive status: Within functional limits for tasks assessed   POSTURE: rounded shoulders, forward head, decreased lumbar lordosis, increased thoracic kyphosis, and flexed trunk   PALPATION: Extremely tight trap mm B    CERVICAL ROM: (3 surgeries with fusion)  Active ROM A/PROM (deg) eval   Flexion 22  Extension 10  Right  lateral flexion   Left lateral flexion   Right rotation 30  Left rotation 38   (Blank rows = not tested) Cervical mm:  Rt SB: 3; LT 3-: extension 3- UPPER EXTREMITY ROM: WFL  UPPER EXTREMITY MMT:  MMT Right eval Left eval  Shoulder flexion    Shoulder extension    Shoulder abduction    Shoulder adduction    Shoulder extension    Shoulder internal rotation    Shoulder external rotation    Middle trapezius    Lower trapezius    Elbow flexion    Elbow extension    Wrist flexion    Wrist extension    Wrist ulnar deviation    Wrist radial deviation    Wrist pronation    Wrist supination    Grip strength 15 27   (Blank rows = not tested)  TODAY'S TREATMENT:                                                                                                                              DATE: 09/01/22 UBE forward level 1 x 5' B upper trapezius stretch x 30" x 3 each side Seated thoracic ext x 20 Doorway pec stretch 60 deg abd x 30" x 3 Foam roller series with (hugs, snow angels) with 2 pillows on back x 20 each Supine chin tucks x 3" x 10 x 2 Standing I, T, Y exercises with YTB x 10 each  08/31/22 Supine:  Postural decompression exercises with theraband:The overhead, the side pull, sash and ER x 5 each Cervical PROM, traction and jt mobs to improve motin Cervical isometric x 10 for  B and extension  Cervical rotation  Sitting:  thoracic extension x 10  08/10/22: Manual to improve ROM and decrease spasm and Pain with HMP Cervical rotation x 10 Isometric SB x 10 Isometric cervical retraction x 10 Scapular retraction x 10 Sitting : Cervical excursion x 3 Tall posture with scapular retraction x 10 W back with 2 # x 10  Shoulder rolls x 10  UBE: level 1 x 3 minutes Standing attempting to get into better posture.(Occipital to wall test) x10   08/03/22: Supine: Manual to improve ROM and decrease spasm and Pain Cervical rotation x  5 Isometric SB x 5 Isometric cervical retraction x 5 Scapular retraction x 5 Sitting : Cervical excursion x 3 Tall posture with scapular retraction x 10 W back x 10  Shoulder rolls x 10  UBE: level 1 x 3 minutes Standing attempting to get into better posture.(Occipital to wall test)   Evaluation: 7;24/24   Correct Seated Posture  5-hold 5" - Seated Scapular Retraction  10 reps - 5" hold - Seated Cervical Retraction  10 reps - 5 hold Hand grip with putty B x 10   PATIENT EDUCATION:  Education details: HEP Person educated: Patient Education method: Explanation and Handouts Education comprehension: verbalized understanding  HOME EXERCISE PROGRAM: Access Code: [email or web address] URL:  [email or web address] 09/01/22 - Seated Upper Trapezius Stretch  - 1-2 x daily - 7 x weekly - 3 reps - 30 hold - Doorway Pec Stretch at 60 Degrees Abduction with Arm Straight  - 1-2 x daily - 7 x weekly - 3 reps - 30 hold - [employer] on [employer]  - 1 x daily - 7 x weekly - 1 sets - 20 reps - Thoracic Foam Roll Mobilization Hug  - 1 x daily - 7 x weekly - 1 sets - 20 reps 08/31/22 Decompression with theraband with red tband.  08/03/22 - Seated Scapular Retraction with External Rotation  - 3 x daily - 7 x weekly - 1 sets - 10 reps - 3" hold - Seated Shoulder Shrug Circles AROM Backward  - 3 x daily - 7 x weekly - 1 sets - 10 reps Cervical excursions Date: 07/27/2022 Prepared by: Virgina Organ  Exercises - Correct Seated Posture  - 4 x daily - 7 x weekly - 1 sets - 5-10 reps - Seated Scapular Retraction  - 4 x daily - 7 x weekly - 1 sets - 5-10 reps - 3-5" hold - Seated Cervical Retraction  - 4 x daily - 7 x weekly - 1 sets - 10 reps - 3-5 hold Hand grip with putty  ASSESSMENT:  CLINICAL IMPRESSION: Interventions today were geared towards cervical and thoracic mobility and strengthening. Tolerated all activities without worsening of symptoms. UBE relieved pre-exercise soreness Demonstrated  appropriate levels of fatigue. Provided slight amount of cueing to ensure correct execution of activity with good carry-over. To date, skilled PT is required to address the impairments and improve function.  OBJECTIVE IMPAIRMENTS: decreased activity tolerance, decreased ROM, decreased strength, increased fascial restrictions, impaired perceived functional ability, postural dysfunction, and pain.   ACTIVITY LIMITATIONS: carrying, lifting, and reach over head  PARTICIPATION LIMITATIONS: cleaning, community activity, and yard work  PERSONAL FACTORS: Past/current experiences and Time since onset of injury/illness/exacerbation are also affecting patient's functional outcome.   REHAB POTENTIAL: Good  CLINICAL DECISION MAKING: Evolving/moderate complexity  EVALUATION COMPLEXITY: Moderate   GOALS: Goals reviewed with patient? No  SHORT TERM GOALS: Target date: 08/17/22  PT to be I in HEP to decrease her pain to no greater than a 5/10 Baseline:  Goal status: on-going   2.  Pt to increase her ROM by 10 degrees to allow improved scanning of area  Baseline:  Goal status: on-going   3.  PT to feel her numbness has decreased by 25% Baseline:  Goal status: on-going     LONG TERM GOALS: Target date: 09/07/22  PT to be I in HEP to decrease her pain to no greater than a 3/10 Baseline:  Goal status: on-going   2.  Pt to increase her ROM by 20 degrees to allow safer driving   Baseline:  Goal status: on-going   3.  PT to feel her numbness has decreased by 50% Baseline:  Goal status: on-going    PLAN:  PT FREQUENCY: 2x/week  PT DURATION: 6 weeks  PLANNED INTERVENTIONS: Therapeutic exercises, Therapeutic activity, Patient/Family education, Self Care, Joint mobilization, and Manual therapy  PLAN FOR NEXT SESSION: measure occipital to wall to have objective measurement.  Continue with manual and postural, isometric cervical while supine for better cervical positioning.   Tish Frederickson.  Jontae Adebayo, PT, DPT, OCS Board-Certified Clinical Specialist in Orthopedic PT PT Compact Privilege # ([place]): [id number] T 09/01/2022, 7:41 AM

## 2022-09-06 ENCOUNTER — Encounter (HOSPITAL_COMMUNITY): Payer: Medicare Other | Admitting: Physical Therapy

## 2022-09-08 ENCOUNTER — Encounter (HOSPITAL_COMMUNITY): Payer: Medicare Other | Admitting: Physical Therapy

## 2022-09-12 ENCOUNTER — Ambulatory Visit (HOSPITAL_COMMUNITY): Payer: Medicare Other | Attending: Family Medicine | Admitting: Physical Therapy

## 2022-09-12 DIAGNOSIS — M6281 Muscle weakness (generalized): Secondary | ICD-10-CM | POA: Diagnosis not present

## 2022-09-12 DIAGNOSIS — M5412 Radiculopathy, cervical region: Secondary | ICD-10-CM | POA: Diagnosis not present

## 2022-09-12 NOTE — Therapy (Signed)
OUTPATIENT PHYSICAL THERAPY CERVICAL Treatment/Discharge   Patient Name: [name] MRN: [id number] DOB:[birth year], 67 y.o., female Today's Date: 09/12/2022  PHYSICAL THERAPY DISCHARGE SUMMARY  Visits from Start of Care: 6  Current functional level related to goals / functional outcomes: See below    Remaining deficits: ROM   Education / Equipment: HEP   Patient agrees to discharge. Patient goals were met. Patient is being discharged due to being pleased with the current functional level.  END OF SESSION:   PT End of Session - 09/12/22 1654    Visit Number 6    Number of Visits 12    Date for PT Re-Evaluation 09/12/2022   Authorization Type BC[employer] medicare    PT Start Time 1637    PT Stop Time 1654    PT Time Calculation (min) 17 min    Activity Tolerance Patient tolerated treatment well    Behavior During Therapy WFL for tasks assessed/performed             Past Medical History:  Diagnosis Date   Allergic rhinitis    Anxiety    Asthma    B12 deficiency 01/18/2015   Cataract    Cavitary lung disease    Chronic sinusitis    COPD (chronic obstructive pulmonary disease) (HCC)    Depression    Diffuse large B-cell lymphoma (HCC)    DVT (deep venous thrombosis) (HCC) 09/25/2014   Essential hypertension    GERD (gastroesophageal reflux disease)    Hematuria 04/07/2016   History of pneumonia 01/2013   Hyperlipidemia    Hypogammaglobulinemia, acquired (HCC)    Iron deficiency anemia 01/18/2015   Myocardial infarction (HCC) 2011   Narcolepsy without cataplexy(347.00) 01/18/2015   Orofacial dyskinesia 04/22/2015   Osteoporosis    Peripheral neuropathy due to chemotherapy (HCC) 05/02/2016   Sleep apnea    SVC syndrome 09/25/2014   Past Surgical History:  Procedure Laterality Date   ABDOMINAL HYSTERECTOMY     Fibroids   BASAL CELL CARCINOMA EXCISION  03/2011   scalp   BREAST SURGERY Right    biopsy   CATARACT EXTRACTION W/PHACO Left 11/17/2014   Procedure:  CATARACT EXTRACTION PHACO AND INTRAOCULAR LENS PLACEMENT LEFT EYE;  Surgeon: Gemma Payor, MD;  Location: [facility];  Service: Ophthalmology;  Laterality: Left;  CDE:5.60   CATARACT EXTRACTION W/PHACO Right 12/15/2014   Procedure: CATARACT EXTRACTION PHACO AND INTRAOCULAR LENS PLACEMENT RIGHT EYE CDE=5.16;  Surgeon: Gemma Payor, MD;  Location: [facility];  Service: Ophthalmology;  Laterality: Right;   COLONOSCOPY N/A 11/14/2012   Procedure: COLONOSCOPY;  Surgeon: Malissa Hippo, MD;  Location: [facility];  Service: Endoscopy;  Laterality: N/A;  830   COLONOSCOPY WITH PROPOFOL N/A 01/12/2018   Procedure: COLONOSCOPY WITH PROPOFOL;  Surgeon: Malissa Hippo, MD;  Location: [facility];  Service: Endoscopy;  Laterality: N/A;  7:30   EYE SURGERY     NASAL SINUS SURGERY     NECK SURGERY     x 2    PERIPHERALLY INSERTED CENTRAL CATHETER INSERTION     PICC Removal     POLYPECTOMY  01/12/2018   Procedure: POLYPECTOMY;  Surgeon: Malissa Hippo, MD;  Location: [facility];  Service: Endoscopy;;  colon   PORT-A-CATH REMOVAL     PORTACATH PLACEMENT  7/11   Removed 6/12   TRACHEOSTOMY     VESICOVAGINAL FISTULA CLOSURE W/ TAH     Patient Active Problem List   Diagnosis Date Noted   Hx of colonic  polyps 12/13/2017   Peripheral neuropathy due to chemotherapy (HCC) 05/02/2016   Recurrent herpes labialis 05/02/2016   Osteoporosis 04/30/2015   Narcolepsy without cataplexy(347.00) 01/18/2015   Iron deficiency anemia 01/18/2015   B12 deficiency 01/18/2015   SVC syndrome 09/25/2014   Sinusitis, chronic 05/06/2014   Abnormal finding on imaging 02/11/2014   OSA (obstructive sleep apnea) 09/10/2012   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) 02/15/2012   Hypogammaglobulinemia, acquired (HCC) 12/30/2009   Non Hodgkin's lymphoma (HCC) 09/09/2009   HLD (hyperlipidemia) 04/21/2007   Essential hypertension 04/21/2007   DIABETES MELLITUS, BORDERLINE 04/21/2007   Moderate persistent asthma 04/03/2007    Allergic rhinitis 10/19/2006   VOCAL CORD DISORDER 10/19/2006   GERD 10/19/2006   GASTRIC POLYP 07/25/2006    PCP: Assunta Found   REFERRING PROVIDER: Tressie Stalker, MD  REFERRING DIAG:  Diagnosis  M43.12 (ICD-10-CM) - Spondylolisthesis, cervical region    THERAPY DIAG:  Cervicalgia   Rationale for Evaluation and Treatment: Rehabilitation  ONSET DATE: chronic;                                                                                                                                                                                                           SUBJECTIVE STATEMENT: PT states that she is doing her exercises about 5x a week; she is not having any pain today PERTINENT HISTORY:  hypogammaglobulinemia and large B-cell lymphoma  PAIN:  Are you having pain? Yes: NPRS scale: 0/10;  worst past week 1/10 ( was 8/10) Pain location: upper trap area B  Pain description: burning  Aggravating factors: vacuuming  Relieving factors: heat and rubs.   PRECAUTIONS: cervical fusion     WEIGHT BEARING RESTRICTIONS: No  FALLS:  Has patient fallen in last 6 months? No  LIVING ENVIRONMENT: Lives with: lives alone Lives in: House/apartment OCCUPATION: retired  PLOF: Independent  PATIENT GOALS: less pain. To have better posture   NEXT MD VISIT: unsure  OBJECTIVE:   DIAGNOSTIC FINDINGS:  IMPRESSION: 1. Chronic C5 through C7 fusion with solid arthrodesis and no adverse features.   2. Progressed adjacent segment disease at C4-C5 since 2012 with mild new anterolisthesis, moderate disc and posterior element degeneration. Mild spinal stenosis AND spinal cord mass effect. No cord mass effect. Moderate to severe bilateral foraminal stenosis. Query C5 radiculitis.   3. Mild new anterolisthesis also at the C7-T1 adjacent segment but no associated stenosis.   4. Chronic upper thoracic compression fractures.     Electronically Signed   By: Odessa Fleming  M.D.  PATIENT SURVEYS:  FOTO 57  COGNITION: Overall cognitive status: Within functional limits for tasks assessed   POSTURE: rounded shoulders, forward head, decreased lumbar lordosis, increased thoracic kyphosis, and flexed trunk   PALPATION: Extremely tight trap mm B    CERVICAL ROM: (3 surgeries with fusion)  Active ROM A/PROM (deg) eval 09/12/22  Flexion 22 38  Extension 10 28  Right lateral flexion    Left lateral flexion    Right rotation 30 42  Left rotation 38 40   (Blank rows = not tested) Eval Cervical mm:  Rt SB: 3; LT 3-: extension 3- 09/12/22 Cervical mm:  RT SB: 4+, Lt 4; extension 3+  UPPER EXTREMITY ROM: [facility]  UPPER EXTREMITY MMT:  MMT Right eval 09/12/22 Left eval 09/12/22  Shoulder flexion      Shoulder extension      Shoulder abduction      Shoulder adduction      Shoulder extension      Shoulder internal rotation      Shoulder external rotation      Middle trapezius      Lower trapezius      Elbow flexion      Elbow extension      Wrist flexion      Wrist extension      Wrist ulnar deviation      Wrist radial deviation      Wrist pronation      Wrist supination      Grip strength 15 36 27 40   (Blank rows = not tested)  TODAY'S TREATMENT:                                                                                                                              DATE: 09/12/22:  reassessment : see above Wall arch x 10  Standing back of head to wall x 10  Therapist emphasized the importance of pt maintaining awareness of good posture.    09/01/22 UBE forward level 1 x 5' B upper trapezius stretch x 30" x 3 each side Seated thoracic ext x 20 Doorway pec stretch 60 deg abd x 30" x 3 Foam roller series with (hugs, snow angels) with 2 pillows on back x 20 each Supine chin tucks x 3" x 10 x 2 Standing I, T, Y exercises with YTB x 10 each  08/31/22 Supine:  Postural decompression exercises with theraband:The overhead, the side pull, sash and ER x  5 each Cervical PROM, traction and jt mobs to improve motin Cervical isometric x 10 for  B and extension  Cervical rotation  Sitting:  thoracic extension x 10  08/10/22: Manual to improve ROM and decrease spasm and Pain with HMP Cervical rotation x 10 Isometric SB x 10 Isometric cervical retraction x 10 Scapular retraction x 10 Sitting : Cervical excursion x 3 Tall posture with scapular retraction x 10 W back with 2 # x 10  Shoulder rolls x 10  UBE:  level 1 x 3 minutes Standing attempting to get into better posture.(Occipital to wall test) x10   08/03/22: Supine: Manual to improve ROM and decrease spasm and Pain Cervical rotation x 5 Isometric SB x 5 Isometric cervical retraction x 5 Scapular retraction x 5 Sitting : Cervical excursion x 3 Tall posture with scapular retraction x 10 W back x 10  Shoulder rolls x 10  UBE: level 1 x 3 minutes Standing attempting to get into better posture.(Occipital to wall test)   Evaluation: 7;24/24   Correct Seated Posture  5-hold 5" - Seated Scapular Retraction  10 reps - 5" hold - Seated Cervical Retraction  10 reps - 5 hold Hand grip with putty B x 10   PATIENT EDUCATION:  Education details: HEP Person educated: Patient Education method: Explanation and Handouts Education comprehension: verbalized understanding  HOME EXERCISE PROGRAM: Access Code: [email or web address] URL: [email or web address] 09/01/22 - Seated Upper Trapezius Stretch  - 1-2 x daily - 7 x weekly - 3 reps - 30 hold - Doorway Pec Stretch at 60 Degrees Abduction with Arm Straight  - 1-2 x daily - 7 x weekly - 3 reps - 30 hold - [employer] on [employer]  - 1 x daily - 7 x weekly - 1 sets - 20 reps - Thoracic Foam Roll Mobilization Hug  - 1 x daily - 7 x weekly - 1 sets - 20 reps 08/31/22 Decompression with theraband with red tband.  08/03/22 - Seated Scapular Retraction with External Rotation  - 3 x daily - 7 x weekly - 1 sets - 10 reps - 3" hold - Seated  Shoulder Shrug Circles AROM Backward  - 3 x daily - 7 x weekly - 1 sets - 10 reps Cervical excursions Date: 07/27/2022 Prepared by: Virgina Organ  Exercises - Correct Seated Posture  - 4 x daily - 7 x weekly - 1 sets - 5-10 reps - Seated Scapular Retraction  - 4 x daily - 7 x weekly - 1 sets - 5-10 reps - 3-5" hold - Seated Cervical Retraction  - 4 x daily - 7 x weekly - 1 sets - 10 reps - 3-5 hold Hand grip with putty  ASSESSMENT:  CLINICAL IMPRESSION: PT reassessed.  All STG and 2/3 LTG have been met.  Pt is pleased with current physical ability and feels she is ready for discharge.   OBJECTIVE IMPAIRMENTS: decreased activity tolerance, decreased ROM, decreased strength, increased fascial restrictions, impaired perceived functional ability, postural dysfunction, and pain.   ACTIVITY LIMITATIONS: carrying, lifting, and reach over head  PARTICIPATION LIMITATIONS: cleaning, community activity, and yard work  PERSONAL FACTORS: Past/current experiences and Time since onset of injury/illness/exacerbation are also affecting patient's functional outcome.   REHAB POTENTIAL: Good  CLINICAL DECISION MAKING: Evolving/moderate complexity  EVALUATION COMPLEXITY: Moderate   GOALS: Goals reviewed with patient? No  SHORT TERM GOALS: Target date: 08/17/22  PT to be I in HEP to decrease her pain to no greater than a 5/10 Baseline:  Goal status: met  2.  Pt to increase her ROM by 10 degrees to allow improved scanning of area  Baseline:  Goal status: met  3.  PT to feel her numbness has decreased by 25% Baseline:  Goal status: met    LONG TERM GOALS: Target date: 09/07/22  PT to be I in HEP to decrease her pain to no greater than a 3/10 Baseline:  Goal status: met  2.  Pt to increase her ROM by 20 degrees to  allow safer driving   Baseline:  Goal status: on-going   3.  PT to feel her numbness has decreased by 50% Baseline:  Goal status: met    PLAN:  PT FREQUENCY:  2x/week  PT DURATION: 6 weeks  PLANNED INTERVENTIONS: Therapeutic exercises, Therapeutic activity, Patient/Family education, Self Care, Joint mobilization, and Manual therapy  PLAN FOR NEXT SESSION: measure occipital to wall to have objective measurement.  Continue with manual and postural, isometric cervical while supine for better cervical positioning.  Virgina Organ, PT CLT [phone]   09/12/2022, 4:54 PM

## 2022-09-13 ENCOUNTER — Encounter (HOSPITAL_COMMUNITY): Payer: Medicare Other | Admitting: Physical Therapy

## 2022-09-15 ENCOUNTER — Encounter (HOSPITAL_COMMUNITY): Payer: Medicare Other | Admitting: Physical Therapy

## 2022-09-20 ENCOUNTER — Encounter (HOSPITAL_COMMUNITY): Payer: Medicare Other | Admitting: Physical Therapy

## 2022-09-22 ENCOUNTER — Encounter (HOSPITAL_COMMUNITY): Payer: Medicare Other

## 2022-09-22 ENCOUNTER — Encounter (HOSPITAL_COMMUNITY): Payer: Medicare Other | Admitting: Physical Therapy

## 2022-10-04 ENCOUNTER — Inpatient Hospital Stay: Payer: Medicare Other | Attending: Hematology

## 2022-10-04 ENCOUNTER — Inpatient Hospital Stay: Payer: Medicare Other

## 2022-10-04 VITALS — BP 147/67 | HR 69 | Temp 97.3°F | Resp 18 | Wt 126.8 lb

## 2022-10-04 DIAGNOSIS — D509 Iron deficiency anemia, unspecified: Secondary | ICD-10-CM | POA: Insufficient documentation

## 2022-10-04 DIAGNOSIS — D801 Nonfamilial hypogammaglobulinemia: Secondary | ICD-10-CM | POA: Diagnosis not present

## 2022-10-04 DIAGNOSIS — C8330 Diffuse large B-cell lymphoma, unspecified site: Secondary | ICD-10-CM | POA: Diagnosis not present

## 2022-10-04 IMAGING — MG MM DIGITAL SCREENING BILAT W/ TOMO AND CAD
8 series · 9 of 24 positions shown · non-contrast
Comparison: Previous exam(s).

CLINICAL DATA: Screening.

EXAM:
DIGITAL SCREENING BILATERAL MAMMOGRAM WITH TOMOSYNTHESIS AND CAD
TECHNIQUE: Bilateral screening digital craniocaudal and mediolateral oblique
mammograms were obtained. Bilateral screening digital breast
tomosynthesis was performed. The images were evaluated with
computer-aided detection.

[L MLO synth-2D]
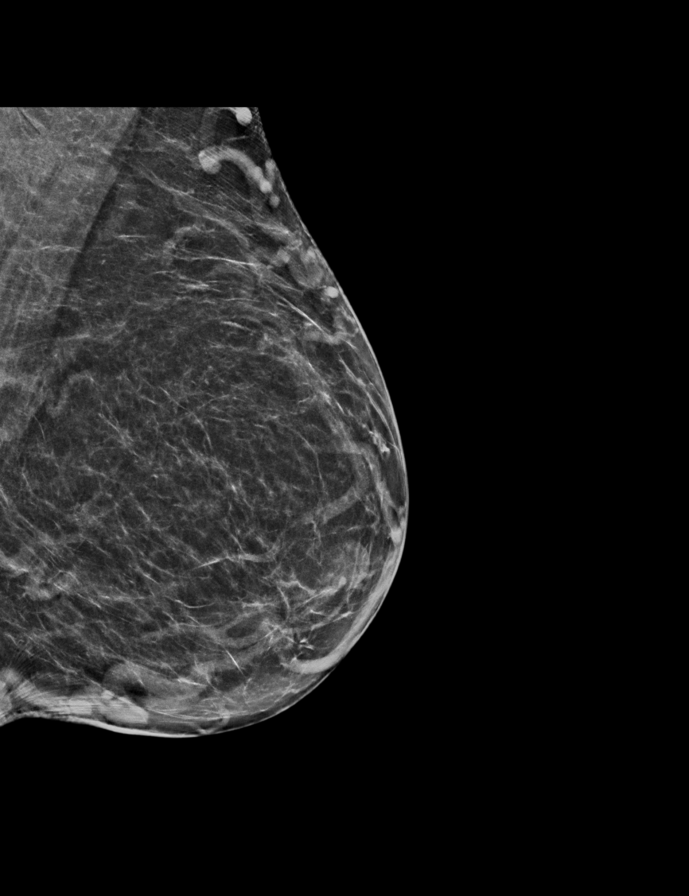

[R CC synth-2D]
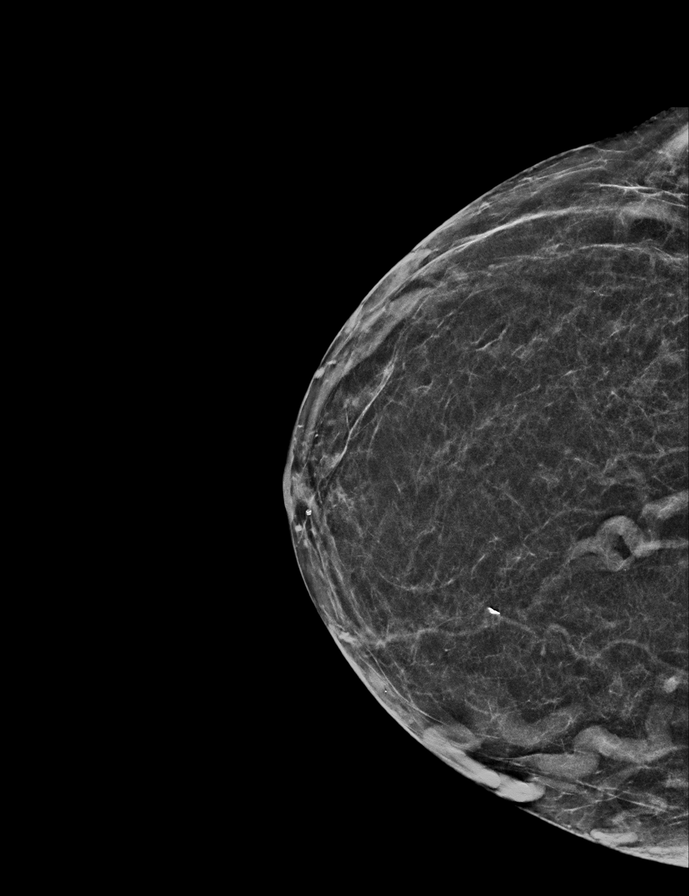

[R MLO synth-2D]
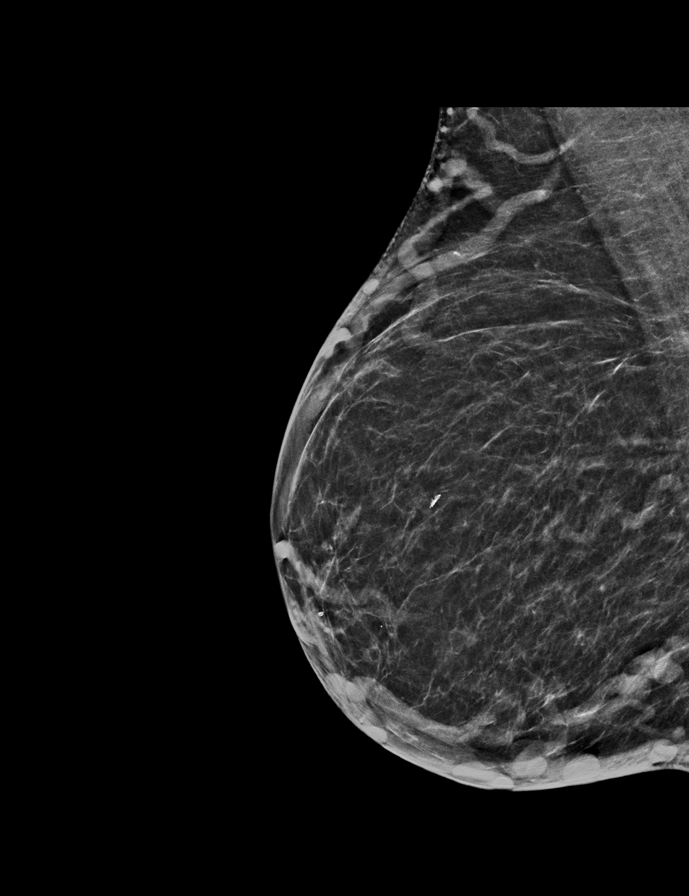

[L CC synth-2D]
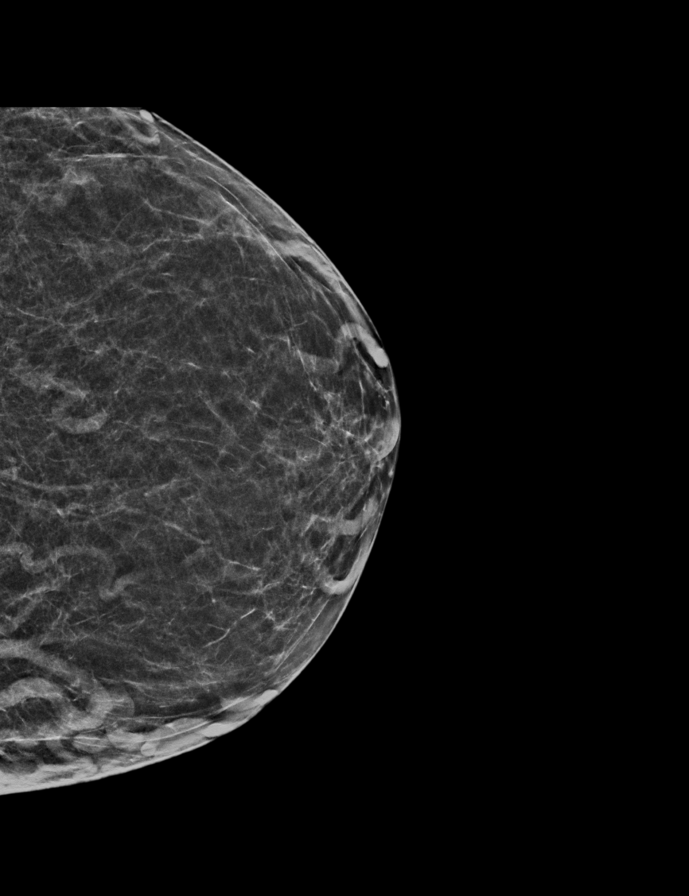

[R CC tomo · 2 of 47 frames shown]
[frame 16/47]
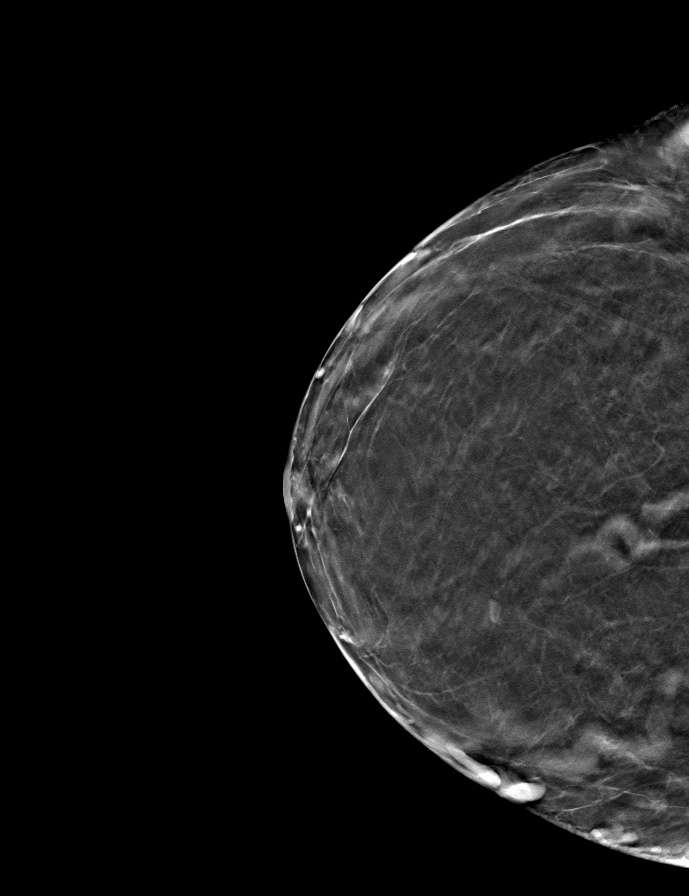
[frame 24/47]
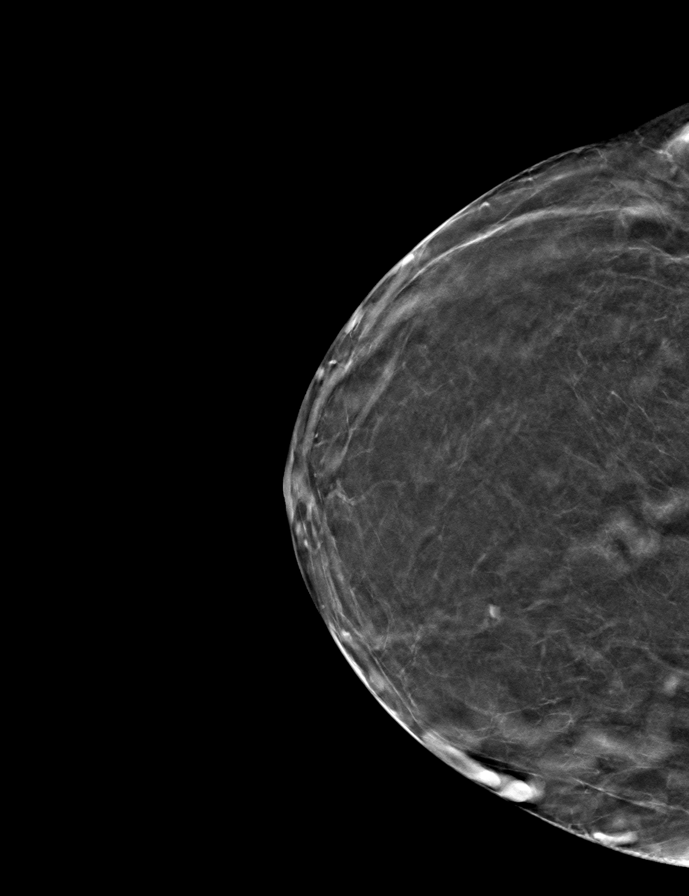

[L MLO tomo · tomo slice 29/56.0]
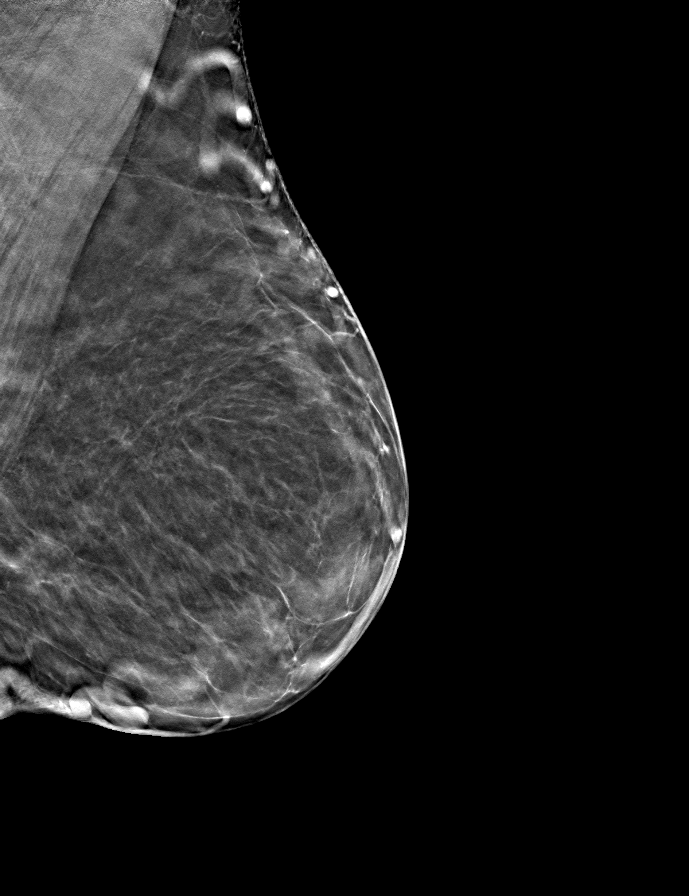

[L CC tomo · tomo slice 25/49.0]
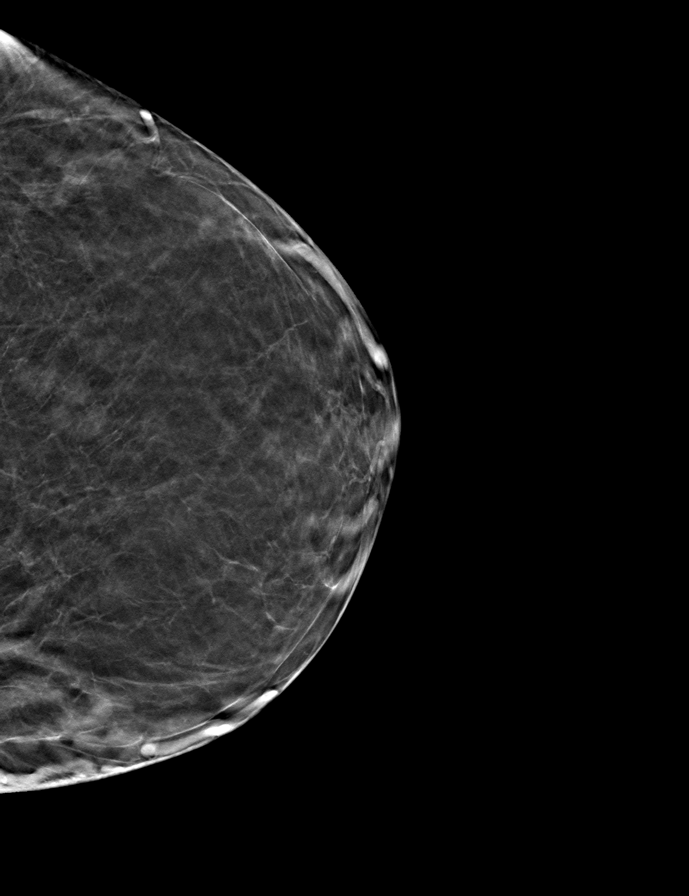

[R MLO tomo · tomo slice 27/53.0]
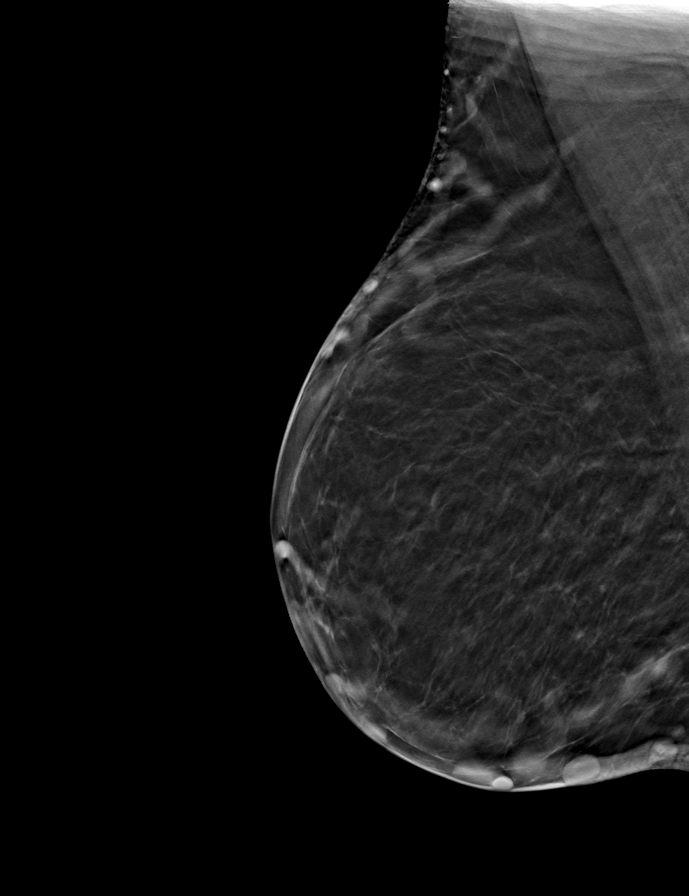

[9 of 24 positions shown; findings below may reference images not displayed]

ACR Breast Density Category b: There are scattered areas of
fibroglandular density.
FINDINGS: There are no findings suspicious for malignancy.
IMPRESSION: No mammographic evidence of malignancy. A result letter of this
screening mammogram will be mailed directly to the patient.

RECOMMENDATION:
Screening mammogram in one year. (Code:[id number]-O-LD2)

BI-RADS CATEGORY  1: Negative.

## 2022-10-04 MED ORDER — DEXTROSE 5 % IV SOLN: INTRAVENOUS | Status: DC

## 2022-10-04 MED ORDER — IMMUNE GLOBULIN (HUMAN) 10 GM/100ML IV SOLN
25.0000 g | Freq: Once | INTRAVENOUS | Status: AC
  Administered 2022-10-04: 25 g via INTRAVENOUS
  Filled 2022-10-04: qty 250

## 2022-10-04 MED ORDER — IMMUNE GLOBULIN (HUMAN) 10 GM/100ML IV SOLN
30.0000 g | Freq: Once | INTRAVENOUS | Status: DC
  Filled 2022-10-04: qty 300

## 2022-10-04 NOTE — Progress Notes (Signed)
Patient presents today for IVIG infusion.  Patient is in satisfactory condition with no new complaints voiced.  Vital signs are stable.  Patient took premeds of Tylenol and Benadryl before arrival at 7am. We will proceed with infusion per provider orders.    Patient tolerated treatment well with no complaints voiced.  Patient left ambulatory in stable condition.  Vital signs stable at discharge.  Follow up as scheduled.

## 2022-10-04 NOTE — Progress Notes (Signed)
Modify dose of Privigen to 25 gm every 4 weeks per provider.  T.O. Dr Carilyn Goodpasture, PharmD

## 2022-10-04 NOTE — Patient Instructions (Signed)
[facility]  Discharge Instructions: Thank you for choosing [facility] to provide your oncology and hematology care.  If you have a lab appointment with the Cancer Center - please note that after April 8th, 2024, all labs will be drawn in the cancer center.  You do not have to check in or register with the main entrance as you have in the past but will complete your check-in in the cancer center.  Wear comfortable clothing and clothing appropriate for easy access to any Portacath or PICC line.   We strive to give you quality time with your provider. You may need to reschedule your appointment if you arrive late (15 or more minutes).  Arriving late affects you and other patients whose appointments are after yours.  Also, if you miss three or more appointments without notifying the office, you may be dismissed from the clinic at the provider's discretion.      For prescription refill requests, have your pharmacy contact our office and allow 72 hours for refills to be completed.    Today you received the following IVIG.  Immune Globulin Injection What is this medication? IMMUNE GLOBULIN (im MUNE  GLOB yoo lin) helps to prevent or reduce the severity of certain infections in patients who are at risk. This medicine is collected from the pooled blood of many donors. It is used to treat immune system problems, thrombocytopenia, and Kawasaki syndrome. This medicine may be used for other purposes; ask your health care provider or pharmacist if you have questions. This medicine may be used for other purposes; ask your health care provider or pharmacist if you have questions. COMMON BRAND NAME(S): ASCENIV, Baygam, BIVIGAM, Carimune, Carimune NF, cutaquig, Cuvitru, Flebogamma, Flebogamma DIF, GamaSTAN, GamaSTAN S/D, Gamimune N, Gammagard, Gammagard S/D, Gammaked, Gammaplex, Gammar-P IV, Gamunex, Gamunex-C, Hizentra, Iveegam, Iveegam EN, Octagam, Panglobulin, Panglobulin NF,  panzyga, Polygam S/D, Privigen, Sandoglobulin, Venoglobulin-S, Vigam, Vivaglobulin, Xembify What should I tell my care team before I take this medication? They need to know if you have any of these conditions: diabetes extremely low or no immune antibodies in the blood heart disease history of blood clots hyperprolinemia infection in the blood, sepsis kidney disease recently received or scheduled to receive a vaccination an unusual or allergic reaction to human immune globulin, albumin, maltose, sucrose, other medicines, foods, dyes, or preservatives pregnant or trying to get pregnant breast-feeding How should I use this medication? This medicine is for injection into a muscle or infusion into a vein or skin. It is usually given by a health care professional in a hospital or clinic setting. In rare cases, some brands of this medicine might be given at home. You will be taught how to give this medicine. Use exactly as directed. Take your medicine at regular intervals. Do not take your medicine more often than directed. Talk to your pediatrician regarding the use of this medicine in children. While this drug may be prescribed for selected conditions, precautions do apply. Overdosage: If you think you have taken too much of this medicine contact a poison control center or emergency room at once. NOTE: This medicine is only for you. Do not share this medicine with others. Overdosage: If you think you have taken too much of this medicine contact a poison control center or emergency room at once. NOTE: This medicine is only for you. Do not share this medicine with others. What if I miss a dose? It is important not to miss your dose. Call  your doctor or health care professional if you are unable to keep an appointment. If you give yourself the medicine and you miss a dose, take it as soon as you can. If it is almost time for your next dose, take only that dose. Do not take double or extra  doses. What may interact with this medication? aspirin and aspirin-like medicines cisplatin cyclosporine medicines for infection like acyclovir, adefovir, amphotericin B, bacitracin, cidofovir, foscarnet, ganciclovir, gentamicin, pentamidine, vancomycin NSAIDS, medicines for pain and inflammation, like ibuprofen or naproxen pamidronate vaccines zoledronic acid This list may not describe all possible interactions. Give your health care provider a list of all the medicines, herbs, non-prescription drugs, or dietary supplements you use. Also tell them if you smoke, drink alcohol, or use illegal drugs. Some items may interact with your medicine. This list may not describe all possible interactions. Give your health care provider a list of all the medicines, herbs, non-prescription drugs, or dietary supplements you use. Also tell them if you smoke, drink alcohol, or use illegal drugs. Some items may interact with your medicine. What should I watch for while using this medication? Your condition will be monitored carefully while you are receiving this medicine. This medicine is made from pooled blood donations of many different people. It may be possible to pass an infection in this medicine. However, the donors are screened for infections and all products are tested for HIV and hepatitis. The medicine is treated to kill most or all bacteria and viruses. Talk to your doctor about the risks and benefits of this medicine. Do not have vaccinations for at least 14 days before, or until at least 3 months after receiving this medicine. What side effects may I notice from receiving this medication? Side effects that you should report to your doctor or health care professional as soon as possible: allergic reactions like skin rash, itching or hives, swelling of the face, lips, or tongue blue colored lips or skin breathing problems chest pain or tightness fever signs and symptoms of aseptic meningitis such  as stiff neck; sensitivity to light; headache; drowsiness; fever; nausea; vomiting; rash signs and symptoms of a blood clot such as chest pain; shortness of breath; pain, swelling, or warmth in the leg signs and symptoms of hemolytic anemia such as fast heartbeat; tiredness; dark yellow or brown urine; or yellowing of the eyes or skin signs and symptoms of kidney injury like trouble passing urine or change in the amount of urine sudden weight gain swelling of the ankles, feet, hands Side effects that usually do not require medical attention (report to your doctor or health care professional if they continue or are bothersome): diarrhea flushing headache increased sweating joint pain muscle cramps muscle pain nausea pain, redness, or irritation at site where injected tiredness This list may not describe all possible side effects. Call your doctor for medical advice about side effects. You may report side effects to FDA at 1-800-FDA-1088. This list may not describe all possible side effects. Call your doctor for medical advice about side effects. You may report side effects to FDA at 1-800-FDA-1088. Where should I keep my medication? Keep out of the reach of children. This drug is usually given in a hospital or clinic and will not be stored at home. In rare cases, some brands of this medicine may be given at home. If you are using this medicine at home, you will be instructed on how to store this medicine. Throw away any unused medicine after the  expiration date on the label. NOTE: This sheet is a summary. It may not cover all possible information. If you have questions about this medicine, talk to your doctor, pharmacist, or health care provider.  2024 Elsevier/Gold Standard (2018-07-25 00:00:00)    To help prevent nausea and vomiting after your treatment, we encourage you to take your nausea medication as directed.  BELOW ARE SYMPTOMS THAT SHOULD BE REPORTED IMMEDIATELY: *FEVER GREATER  THAN 100.4 F (38 C) OR HIGHER *CHILLS OR SWEATING *NAUSEA AND VOMITING THAT IS NOT CONTROLLED WITH YOUR NAUSEA MEDICATION *UNUSUAL SHORTNESS OF BREATH *UNUSUAL BRUISING OR BLEEDING *URINARY PROBLEMS (pain or burning when urinating, or frequent urination) *BOWEL PROBLEMS (unusual diarrhea, constipation, pain near the anus) TENDERNESS IN MOUTH AND THROAT WITH OR WITHOUT PRESENCE OF ULCERS (sore throat, sores in mouth, or a toothache) UNUSUAL RASH, SWELLING OR PAIN  UNUSUAL VAGINAL DISCHARGE OR ITCHING   Items with * indicate a potential emergency and should be followed up as soon as possible or go to the Emergency Department if any problems should occur.  Please show the CHEMOTHERAPY ALERT CARD or IMMUNOTHERAPY ALERT CARD at check-in to the Emergency Department and triage nurse.  Should you have questions after your visit or need to cancel or reschedule your appointment, please contact [facility] [phone]  and follow the prompts.  Office hours are 8:00 a.m. to 4:30 p.m. Monday - Friday. Please note that voicemails left after 4:00 p.m. may not be returned until the following business day.  We are closed weekends and major holidays. You have access to a nurse at all times for urgent questions. Please call the main number to the clinic [phone] and follow the prompts.  For any non-urgent questions, you may also contact your provider using MyChart. We now offer e-Visits for anyone 72 and older to request care online for non-urgent symptoms. For details visit [email or web address].   Also download the MyChart app! Go to the app store, search "MyChart", open the app, select [facility], and log in with your MyChart username and password.

## 2022-10-19 DIAGNOSIS — G47419 Narcolepsy without cataplexy: Secondary | ICD-10-CM | POA: Diagnosis not present

## 2022-10-19 DIAGNOSIS — Z23 Encounter for immunization: Secondary | ICD-10-CM | POA: Diagnosis not present

## 2022-10-19 DIAGNOSIS — Z6822 Body mass index (BMI) 22.0-22.9, adult: Secondary | ICD-10-CM | POA: Diagnosis not present

## 2022-10-19 DIAGNOSIS — F909 Attention-deficit hyperactivity disorder, unspecified type: Secondary | ICD-10-CM | POA: Diagnosis not present

## 2022-11-15 DIAGNOSIS — M4312 Spondylolisthesis, cervical region: Secondary | ICD-10-CM | POA: Diagnosis not present

## 2022-11-22 ENCOUNTER — Inpatient Hospital Stay: Payer: Medicare Other

## 2022-11-27 IMAGING — MR MR CERVICAL SPINE W/O CM
5 series · 37 of 48 positions shown · non-contrast
Comparison: Cervical spine radiographs 03/16/2021. Prior cervical
MRI 01/25/2010.

CLINICAL DATA: 66-year-old female with neck pain for 4 months.
Numbness in the left hand and fingers. Burning pain through the
shoulder and neck.

EXAM:
MRI CERVICAL SPINE WITHOUT CONTRAST
TECHNIQUE: Multiplanar, multisequence MR imaging of the cervical spine was
performed. No intravenous contrast was administered.

[Series 5: T2 · sagittal · 3.0mm · 0.69mm/px · 6 of 15 slices shown (1 of 2)]
[im 1/15]
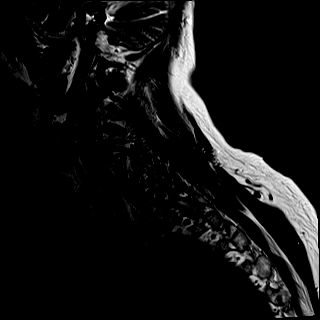
[im 3/15]
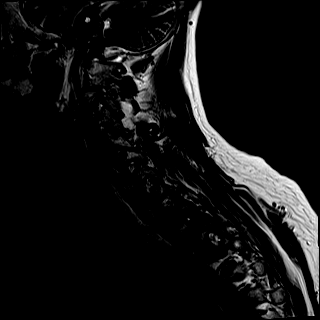
[im 6/15]
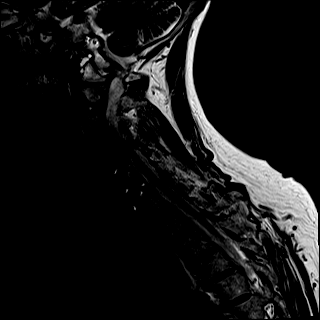
[im 9/15]
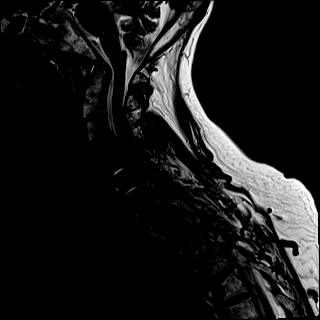
[im 12/15]
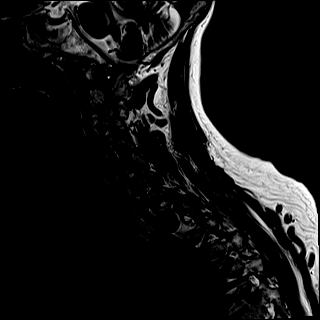
[im 15/15]
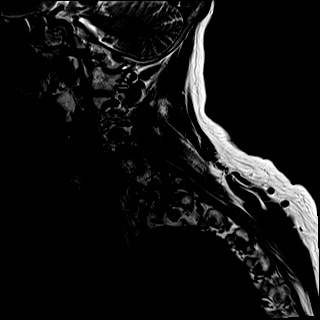

[Series 6: T1 · sagittal · 3.0mm · 0.86mm/px · 6 of 15 slices shown]
[im 1/15]
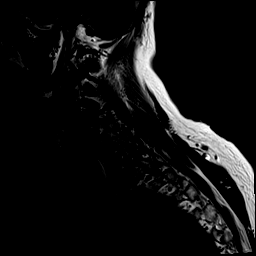
[im 3/15]
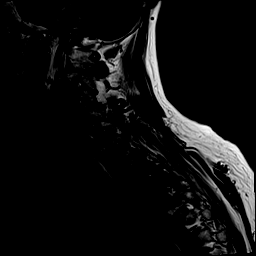
[im 6/15]
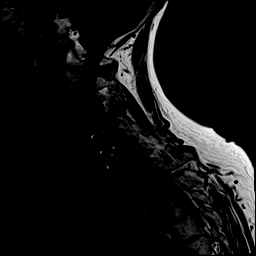
[im 9/15]
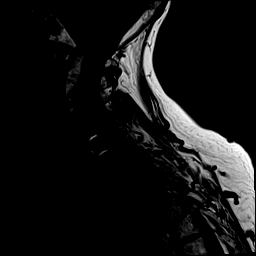
[im 12/15]
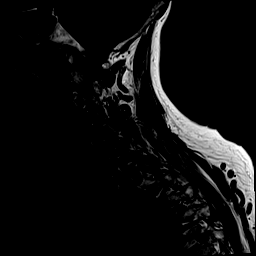
[im 15/15]
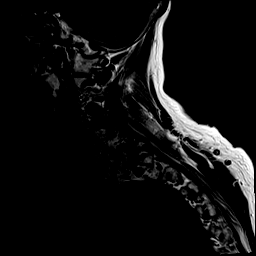

[Series 7: STIR · sagittal · 3.0mm · 0.69mm/px · 6 of 15 slices shown]
[im 1/15]
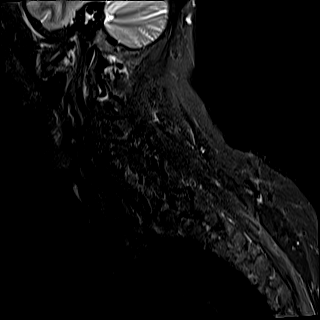
[im 3/15]
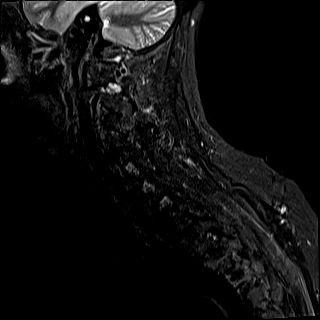
[im 6/15]
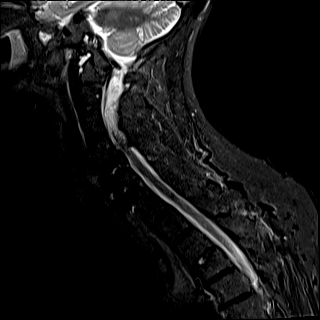
[im 9/15]
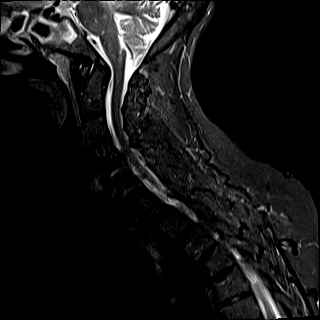
[im 12/15]
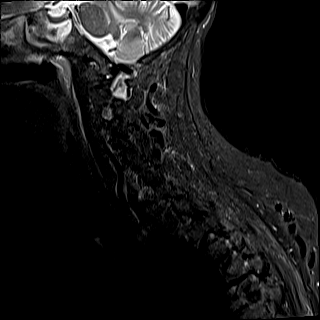
[im 15/15]
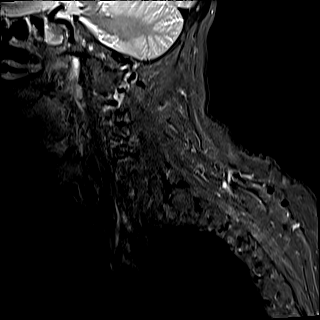

[Series 9: GRE · axial · 3.0mm · 0.35mm/px · z∈[-99,+3]mm · 8 of 35 slices shown]
[im 1/35]
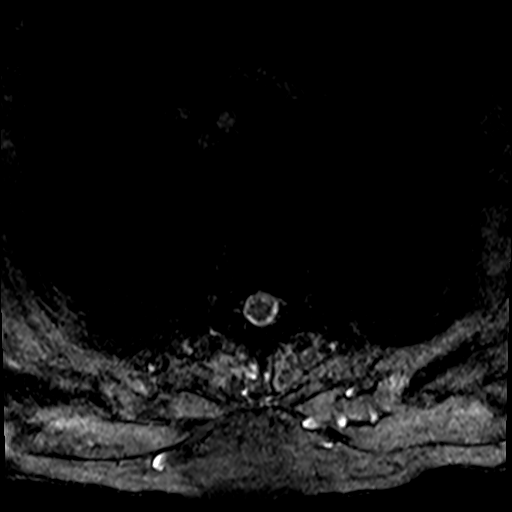
[im 5/35]
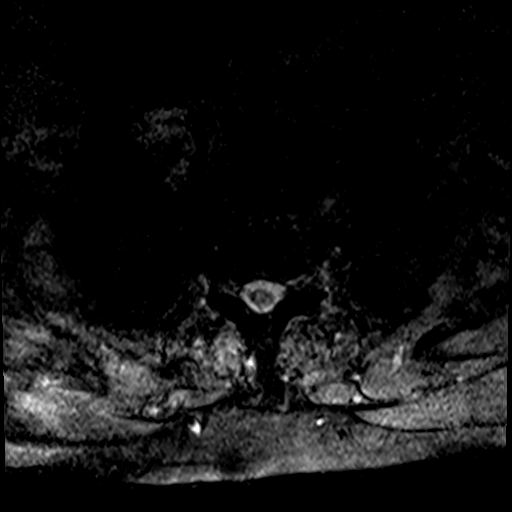
[im 10/35]
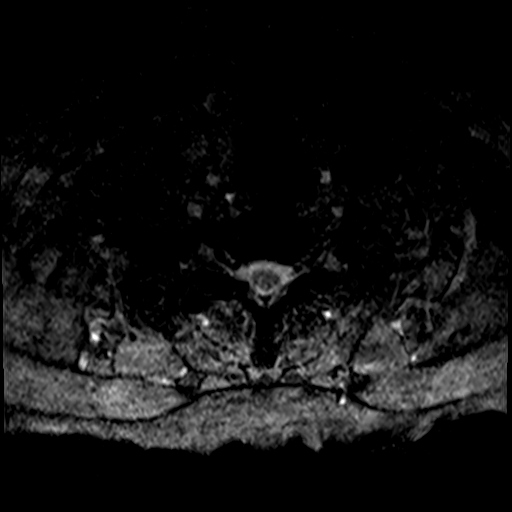
[im 15/35]
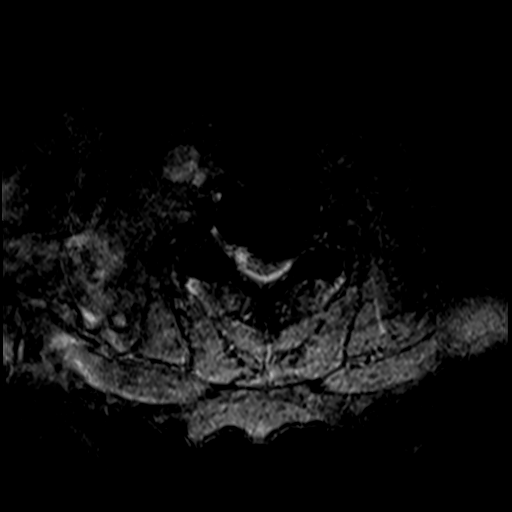
[im 20/35]
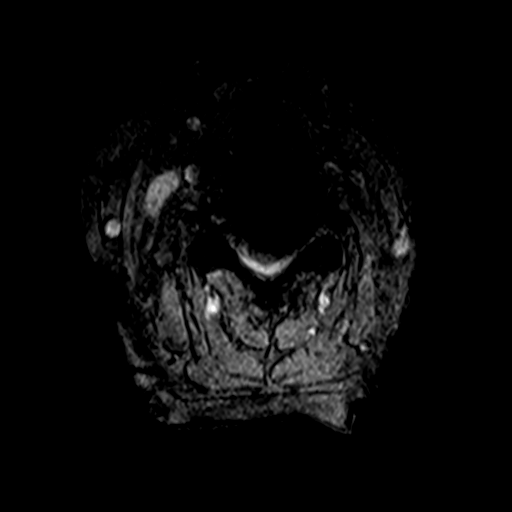
[im 25/35]
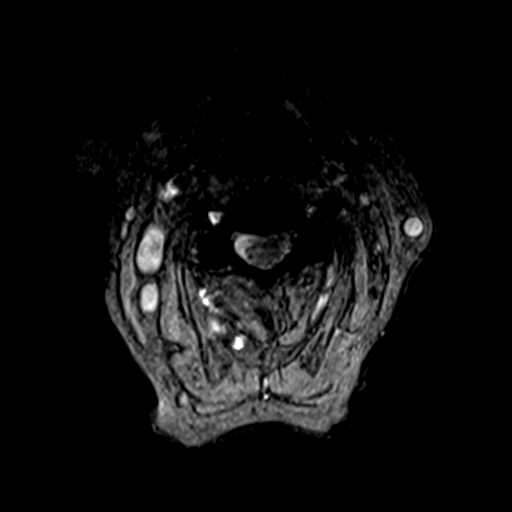
[im 30/35]
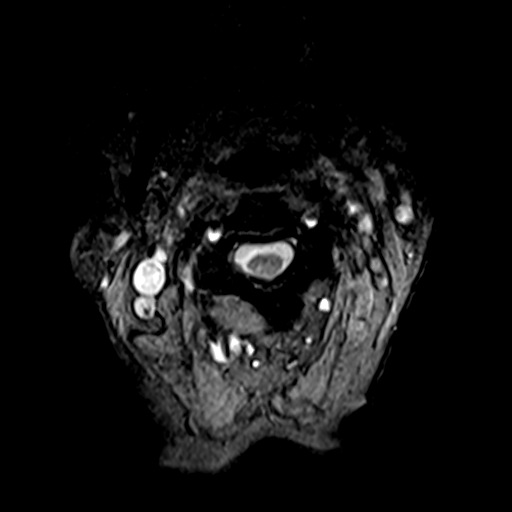
[im 35/35]
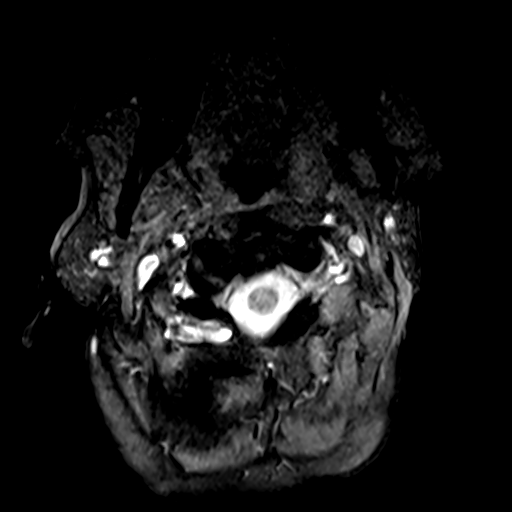

[Series 10: T2 · axial · 3.0mm · 0.70mm/px · z∈[-99,+3]mm · 11 of 35 slices shown (2 of 2)]
[im 1/35]
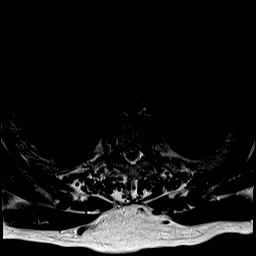
[im 3/35]
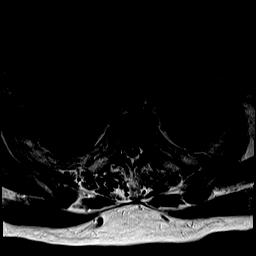
[im 5/35]
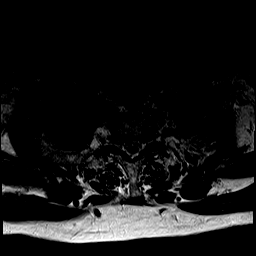
[im 8/35]
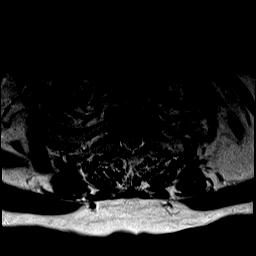
[im 10/35]
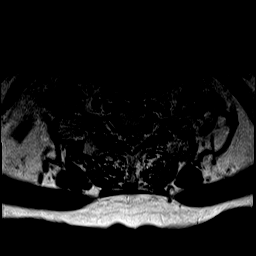
[im 15/35]
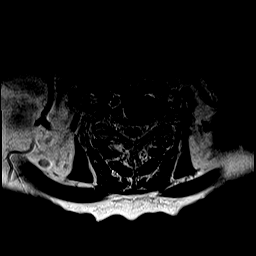
[im 18/35]
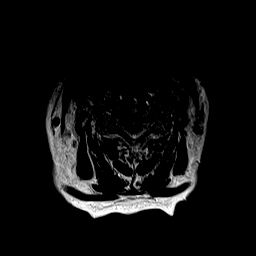
[im 20/35]
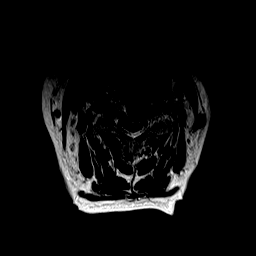
[im 25/35]
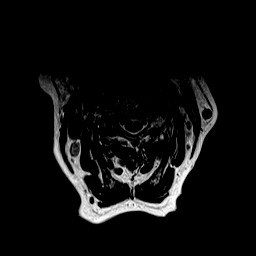
[im 30/35]
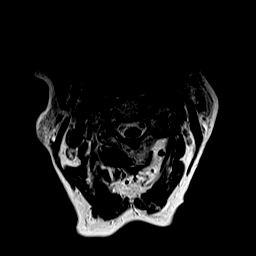
[im 35/35]
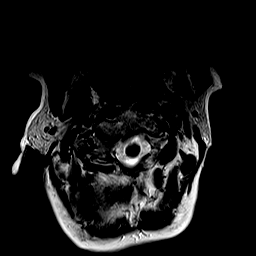

[37 of 48 positions shown; findings below may reference images not displayed]

FINDINGS: Alignment: Largely stable since the 9849 MRI, however subtle
degenerative appearing anterolisthesis has developed at both C4-C5
and C7-T1 (2 mm at each).

Vertebrae: Chronic C5 through C7 fusion with C5-C6 ACDF hardware
remaining in place with mild susceptibility artifact, solid
arthrodesis throughout those levels.

Chronic upper thoracic compression fractures of T3 and T4 were
present in 9849.

Normal background bone marrow signal. No marrow edema or evidence of
acute osseous abnormality.

Cord: Mild degenerative spinal cord mass effect now at C4-C5, no
cord signal abnormality. Below that level the spinal canal is
capacious.

Posterior Fossa, vertebral arteries, paraspinal tissues:
Cervicomedullary junction is within normal limits. Negative visible
posterior fossa. Preserved major vascular flow voids in the neck.
Negative visible neck soft tissues and lung apices.

Sphenoid sinus mucosal thickening is visible at the skull base.

Disc levels:

C2-C3:  Negative.

C3-C4: Mild to moderate facet and ligament flavum hypertrophy. No
spinal stenosis. Mild C4 foraminal stenosis is chronic Nande Lebby
greater on the left, and appears stable since 9849.

C4-C5: New anterolisthesis. Broad-based posterior disc osteophyte
complex with increased and moderate facet and ligament flavum
hypertrophy. Mild spinal stenosis and ventral cord mass effect
(series 10, image 13). Moderate to severe bilateral C5 foraminal
stenosis.

C5-C6:  Stable chronic ACDF with no adverse features.

C6-C7:  Stable chronic ACDF with no adverse features.

C7-T1: Subtle anterolisthesis. Mild to moderate facet hypertrophy
with no spinal or convincing foraminal stenosis.
IMPRESSION: 1. Chronic C5 through C7 fusion with solid arthrodesis and no
adverse features.

2. Progressed adjacent segment disease at C4-C5 since 9849 with mild
new anterolisthesis, moderate disc and posterior element
degeneration. Mild spinal stenosis AND spinal cord mass effect. No
cord mass effect. Moderate to severe bilateral foraminal stenosis.
Query C5 radiculitis.

3. Mild new anterolisthesis also at the C7-T1 adjacent segment but
no associated stenosis.

4. Chronic upper thoracic compression fractures.

## 2022-11-29 ENCOUNTER — Inpatient Hospital Stay: Payer: Medicare Other | Attending: Hematology

## 2022-11-29 ENCOUNTER — Inpatient Hospital Stay: Payer: Medicare Other

## 2022-11-29 ENCOUNTER — Inpatient Hospital Stay: Payer: Medicare Other | Admitting: Hematology

## 2022-11-29 VITALS — BP 135/74 | HR 75 | Temp 97.2°F | Resp 18 | Wt 125.2 lb

## 2022-11-29 DIAGNOSIS — D509 Iron deficiency anemia, unspecified: Secondary | ICD-10-CM | POA: Diagnosis not present

## 2022-11-29 DIAGNOSIS — C8330 Diffuse large B-cell lymphoma, unspecified site: Secondary | ICD-10-CM | POA: Diagnosis not present

## 2022-11-29 DIAGNOSIS — F3342 Major depressive disorder, recurrent, in full remission: Secondary | ICD-10-CM | POA: Diagnosis not present

## 2022-11-29 DIAGNOSIS — D801 Nonfamilial hypogammaglobulinemia: Secondary | ICD-10-CM | POA: Insufficient documentation

## 2022-11-29 DIAGNOSIS — D508 Other iron deficiency anemias: Secondary | ICD-10-CM

## 2022-11-29 DIAGNOSIS — C8338 Diffuse large B-cell lymphoma, lymph nodes of multiple sites: Secondary | ICD-10-CM

## 2022-11-29 LAB — CBC WITH DIFFERENTIAL/PLATELET
Abs Immature Granulocytes: 0.02 10*3/uL (ref 0.00–0.07)
Basophils Absolute: 0.1 10*3/uL (ref 0.0–0.1)
Basophils Relative: 1 %
Eosinophils Absolute: 0.2 10*3/uL (ref 0.0–0.5)
Eosinophils Relative: 3 %
HCT: 41.2 % (ref 36.0–46.0)
Hemoglobin: 13.2 g/dL (ref 12.0–15.0)
Immature Granulocytes: 0 %
Lymphocytes Relative: 26 %
Lymphs Abs: 2 10*3/uL (ref 0.7–4.0)
MCH: 31.8 pg (ref 26.0–34.0)
MCHC: 32 g/dL (ref 30.0–36.0)
MCV: 99.3 fL (ref 80.0–100.0)
Monocytes Absolute: 0.7 10*3/uL (ref 0.1–1.0)
Monocytes Relative: 9 %
Neutro Abs: 4.7 10*3/uL (ref 1.7–7.7)
Neutrophils Relative %: 61 %
Platelets: 196 10*3/uL (ref 150–400)
RBC: 4.15 MIL/uL (ref 3.87–5.11)
RDW: 12.4 % (ref 11.5–15.5)
WBC: 7.7 10*3/uL (ref 4.0–10.5)
nRBC: 0 % (ref 0.0–0.2)

## 2022-11-29 LAB — COMPREHENSIVE METABOLIC PANEL
ALT: 11 U/L (ref 0–44)
AST: 18 U/L (ref 15–41)
Albumin: 3.8 g/dL (ref 3.5–5.0)
Alkaline Phosphatase: 106 U/L (ref 38–126)
Anion gap: 10 (ref 5–15)
BUN: 19 mg/dL (ref 8–23)
CO2: 28 mmol/L (ref 22–32)
Calcium: 9.1 mg/dL (ref 8.9–10.3)
Chloride: 102 mmol/L (ref 98–111)
Creatinine, Ser: 1.09 mg/dL — ABNORMAL HIGH (ref 0.44–1.00)
GFR, Estimated: 56 mL/min — ABNORMAL LOW (ref >60)
Glucose, Bld: 95 mg/dL (ref 70–99)
Potassium: 3.8 mmol/L (ref 3.5–5.1)
Sodium: 140 mmol/L (ref 135–145)
Total Bilirubin: 0.6 mg/dL (ref <1.2)
Total Protein: 8 g/dL (ref 6.5–8.1)

## 2022-11-29 LAB — IRON AND TIBC
Iron: 57 ug/dL (ref 28–170)
Saturation Ratios: 30 % (ref 10.4–31.8)
TIBC: 189 ug/dL — ABNORMAL LOW (ref 250–450)
UIBC: 132 ug/dL

## 2022-11-29 LAB — FERRITIN: Ferritin: 223 ng/mL (ref 11–307)

## 2022-11-29 LAB — LACTATE DEHYDROGENASE: LDH: 162 U/L (ref 98–192)

## 2022-11-29 LAB — MAGNESIUM: Magnesium: 2 mg/dL (ref 1.7–2.4)

## 2022-11-29 MED ORDER — ACETAMINOPHEN 325 MG PO TABS: 650.0000 mg | ORAL_TABLET | Freq: Four times a day (QID) | ORAL | Status: DC | PRN

## 2022-11-29 MED ORDER — HEPARIN SOD (PORK) LOCK FLUSH 100 UNIT/ML IV SOLN
500.0000 [IU] | Freq: Once | INTRAVENOUS | Status: DC | PRN
Start: 2022-11-29 — End: 2022-11-29

## 2022-11-29 MED ORDER — HEPARIN SOD (PORK) LOCK FLUSH 100 UNIT/ML IV SOLN
250.0000 [IU] | Freq: Once | INTRAVENOUS | Status: DC | PRN
Start: 2022-11-29 — End: 2022-11-29

## 2022-11-29 MED ORDER — DIPHENHYDRAMINE HCL 25 MG PO CAPS: 25.0000 mg | ORAL_CAPSULE | Freq: Once | ORAL | Status: DC

## 2022-11-29 MED ORDER — SODIUM CHLORIDE 0.9 % IJ SOLN: 10.0000 mL | INTRAMUSCULAR | Status: DC | PRN

## 2022-11-29 MED ORDER — IMMUNE GLOBULIN (HUMAN) 10 GM/100ML IV SOLN
25.0000 g | Freq: Once | INTRAVENOUS | Status: AC
  Administered 2022-11-29: 25 g via INTRAVENOUS
  Filled 2022-11-29: qty 250

## 2022-11-29 MED ORDER — ALTEPLASE 2 MG IJ SOLR
2.0000 mg | Freq: Once | INTRAMUSCULAR | Status: DC | PRN
Start: 2022-11-29 — End: 2022-11-29

## 2022-11-29 MED ORDER — DEXTROSE 5 % IV SOLN: INTRAVENOUS | Status: DC

## 2022-11-29 NOTE — Patient Instructions (Signed)
[facility] - A DEPT OF [facility]  Discharge Instructions: Thank you for choosing [facility] to provide your oncology and hematology care.  If you have a lab appointment with the Cancer Center - please note that after April 8th, 2024, all labs will be drawn in the cancer center.  You do not have to check in or register with the main entrance as you have in the past but will complete your check-in in the cancer center.  Wear comfortable clothing and clothing appropriate for easy access to any Portacath or PICC line.   We strive to give you quality time with your provider. You may need to reschedule your appointment if you arrive late (15 or more minutes).  Arriving late affects you and other patients whose appointments are after yours.  Also, if you miss three or more appointments without notifying the office, you may be dismissed from the clinic at the provider's discretion.      For prescription refill requests, have your pharmacy contact our office and allow 72 hours for refills to be completed.    Today you received the following chemotherapy and/or immunotherapy agents IVIG.      To help prevent nausea and vomiting after your treatment, we encourage you to take your nausea medication as directed.  BELOW ARE SYMPTOMS THAT SHOULD BE REPORTED IMMEDIATELY: *FEVER GREATER THAN 100.4 F (38 C) OR HIGHER *CHILLS OR SWEATING *NAUSEA AND VOMITING THAT IS NOT CONTROLLED WITH YOUR NAUSEA MEDICATION *UNUSUAL SHORTNESS OF BREATH *UNUSUAL BRUISING OR BLEEDING *URINARY PROBLEMS (pain or burning when urinating, or frequent urination) *BOWEL PROBLEMS (unusual diarrhea, constipation, pain near the anus) TENDERNESS IN MOUTH AND THROAT WITH OR WITHOUT PRESENCE OF ULCERS (sore throat, sores in mouth, or a toothache) UNUSUAL RASH, SWELLING OR PAIN  UNUSUAL VAGINAL DISCHARGE OR ITCHING   Items with * indicate a potential emergency and should be followed up as  soon as possible or go to the Emergency Department if any problems should occur.  Please show the CHEMOTHERAPY ALERT CARD or IMMUNOTHERAPY ALERT CARD at check-in to the Emergency Department and triage nurse.  Should you have questions after your visit or need to cancel or reschedule your appointment, please contact [facility] - A DEPT OF [facility] [phone]  and follow the prompts.  Office hours are 8:00 a.m. to 4:30 p.m. Monday - Friday. Please note that voicemails left after 4:00 p.m. may not be returned until the following business day.  We are closed weekends and major holidays. You have access to a nurse at all times for urgent questions. Please call the main number to the clinic [phone] and follow the prompts.  For any non-urgent questions, you may also contact your provider using MyChart. We now offer e-Visits for anyone 22 and older to request care online for non-urgent symptoms. For details visit [email or web address].   Also download the MyChart app! Go to the app store, search "MyChart", open the app, select [facility], and log in with your MyChart username and password.

## 2022-11-29 NOTE — Progress Notes (Signed)
Patient presents today for IVIG infusion per providers order.  Vital signs WNL.  Patient has no new complaints at this time.  Peripheral IV started and blood return noted pre and post infusion.  Stable during infusion without adverse affects.  Vital signs stable.  No complaints at this time.  Discharge from clinic ambulatory in stable condition.  Alert and oriented X 3.  Follow up with [facility] as scheduled.

## 2022-11-30 LAB — IGG, IGA, IGM
IgA: <5 mg/dL — ABNORMAL LOW (ref 87–352)
IgG (Immunoglobin G), Serum: 1824 mg/dL — ABNORMAL HIGH (ref 586–1602)
IgM (Immunoglobulin M), Srm: 120 mg/dL (ref 26–217)

## 2022-12-21 ENCOUNTER — Encounter (INDEPENDENT_AMBULATORY_CARE_PROVIDER_SITE_OTHER): Payer: Self-pay | Admitting: *Deleted

## 2023-01-10 DIAGNOSIS — F909 Attention-deficit hyperactivity disorder, unspecified type: Secondary | ICD-10-CM | POA: Diagnosis not present

## 2023-01-10 DIAGNOSIS — R6889 Other general symptoms and signs: Secondary | ICD-10-CM | POA: Diagnosis not present

## 2023-01-10 DIAGNOSIS — Z20828 Contact with and (suspected) exposure to other viral communicable diseases: Secondary | ICD-10-CM | POA: Diagnosis not present

## 2023-01-10 DIAGNOSIS — J441 Chronic obstructive pulmonary disease with (acute) exacerbation: Secondary | ICD-10-CM | POA: Diagnosis not present

## 2023-01-10 DIAGNOSIS — Z6822 Body mass index (BMI) 22.0-22.9, adult: Secondary | ICD-10-CM | POA: Diagnosis not present

## 2023-01-10 DIAGNOSIS — J069 Acute upper respiratory infection, unspecified: Secondary | ICD-10-CM | POA: Diagnosis not present

## 2023-01-16 ENCOUNTER — Other Ambulatory Visit: Payer: Self-pay

## 2023-01-16 DIAGNOSIS — D649 Anemia, unspecified: Secondary | ICD-10-CM

## 2023-01-16 DIAGNOSIS — D801 Nonfamilial hypogammaglobulinemia: Secondary | ICD-10-CM

## 2023-01-16 DIAGNOSIS — D508 Other iron deficiency anemias: Secondary | ICD-10-CM

## 2023-01-16 DIAGNOSIS — C8338 Diffuse large B-cell lymphoma, lymph nodes of multiple sites: Secondary | ICD-10-CM

## 2023-01-17 ENCOUNTER — Inpatient Hospital Stay: Payer: Medicare Other | Attending: Hematology

## 2023-01-17 DIAGNOSIS — C83398 Diffuse large b-cell lymphoma of other extranodal and solid organ sites: Secondary | ICD-10-CM | POA: Insufficient documentation

## 2023-01-17 DIAGNOSIS — M5489 Other dorsalgia: Secondary | ICD-10-CM | POA: Insufficient documentation

## 2023-01-17 DIAGNOSIS — C8338 Diffuse large B-cell lymphoma, lymph nodes of multiple sites: Secondary | ICD-10-CM

## 2023-01-17 DIAGNOSIS — D801 Nonfamilial hypogammaglobulinemia: Secondary | ICD-10-CM | POA: Diagnosis not present

## 2023-01-17 DIAGNOSIS — D649 Anemia, unspecified: Secondary | ICD-10-CM

## 2023-01-17 DIAGNOSIS — D509 Iron deficiency anemia, unspecified: Secondary | ICD-10-CM | POA: Diagnosis not present

## 2023-01-17 DIAGNOSIS — D508 Other iron deficiency anemias: Secondary | ICD-10-CM

## 2023-01-17 LAB — CBC WITH DIFFERENTIAL/PLATELET
Abs Immature Granulocytes: 0.09 10*3/uL — ABNORMAL HIGH (ref 0.00–0.07)
Basophils Absolute: 0 10*3/uL (ref 0.0–0.1)
Basophils Relative: 0 %
Eosinophils Absolute: 0.2 10*3/uL (ref 0.0–0.5)
Eosinophils Relative: 2 %
HCT: 35.9 % — ABNORMAL LOW (ref 36.0–46.0)
Hemoglobin: 11.4 g/dL — ABNORMAL LOW (ref 12.0–15.0)
Immature Granulocytes: 1 %
Lymphocytes Relative: 15 %
Lymphs Abs: 2.1 10*3/uL (ref 0.7–4.0)
MCH: 31.8 pg (ref 26.0–34.0)
MCHC: 31.8 g/dL (ref 30.0–36.0)
MCV: 100 fL (ref 80.0–100.0)
Monocytes Absolute: 0.6 10*3/uL (ref 0.1–1.0)
Monocytes Relative: 4 %
Neutro Abs: 11.4 10*3/uL — ABNORMAL HIGH (ref 1.7–7.7)
Neutrophils Relative %: 78 %
Platelets: 260 10*3/uL (ref 150–400)
RBC: 3.59 MIL/uL — ABNORMAL LOW (ref 3.87–5.11)
RDW: 13.1 % (ref 11.5–15.5)
WBC: 14.4 10*3/uL — ABNORMAL HIGH (ref 4.0–10.5)
nRBC: 0 % (ref 0.0–0.2)

## 2023-01-17 LAB — COMPREHENSIVE METABOLIC PANEL
ALT: 26 U/L (ref 0–44)
AST: 18 U/L (ref 15–41)
Albumin: 2.7 g/dL — ABNORMAL LOW (ref 3.5–5.0)
Alkaline Phosphatase: 109 U/L (ref 38–126)
Anion gap: 10 (ref 5–15)
BUN: 16 mg/dL (ref 8–23)
CO2: 28 mmol/L (ref 22–32)
Calcium: 8.6 mg/dL — ABNORMAL LOW (ref 8.9–10.3)
Chloride: 101 mmol/L (ref 98–111)
Creatinine, Ser: 0.93 mg/dL (ref 0.44–1.00)
GFR, Estimated: >60 mL/min (ref >60)
Glucose, Bld: 157 mg/dL — ABNORMAL HIGH (ref 70–99)
Potassium: 3.3 mmol/L — ABNORMAL LOW (ref 3.5–5.1)
Sodium: 139 mmol/L (ref 135–145)
Total Bilirubin: 0.6 mg/dL (ref 0.0–1.2)
Total Protein: 6.8 g/dL (ref 6.5–8.1)

## 2023-01-17 LAB — IRON AND TIBC
Iron: 59 ug/dL (ref 28–170)
Saturation Ratios: 38 % — ABNORMAL HIGH (ref 10.4–31.8)
TIBC: 154 ug/dL — ABNORMAL LOW (ref 250–450)
UIBC: 95 ug/dL

## 2023-01-17 LAB — FERRITIN: Ferritin: 436 ng/mL — ABNORMAL HIGH (ref 11–307)

## 2023-01-17 LAB — LACTATE DEHYDROGENASE: LDH: 116 U/L (ref 98–192)

## 2023-01-17 LAB — MAGNESIUM: Magnesium: 1.6 mg/dL — ABNORMAL LOW (ref 1.7–2.4)

## 2023-01-18 LAB — IGG, IGA, IGM
IgA: <5 mg/dL — ABNORMAL LOW (ref 87–352)
IgG (Immunoglobin G), Serum: 1715 mg/dL — ABNORMAL HIGH (ref 586–1602)
IgM (Immunoglobulin M), Srm: 118 mg/dL (ref 26–217)

## 2023-01-24 ENCOUNTER — Inpatient Hospital Stay: Payer: Medicare Other

## 2023-01-24 ENCOUNTER — Inpatient Hospital Stay: Payer: Medicare Other | Admitting: Hematology

## 2023-01-24 NOTE — Progress Notes (Signed)
[facility] [address] [place] [address]    Clinic Day:  01/25/2023  Referring physician: Assunta Found, MD  Patient Care Team: Assunta Found, MD as PCP - General (Family Medicine) Jonelle Sidle, MD as PCP - Cardiology (Cardiology) Malissa Hippo, MD (Inactive) as Consulting Physician (Gastroenterology) Lauris Poag, MD as Consulting Physician (Nephrology) Ralene Cork, MD as Consulting Physician (Oncology) Beryle Beams, MD as Consulting Physician (Neurology) Marcelino Duster, MD as Referring Physician (Dermatology) Daisy Lazar, DO (Optometry) Jamison Neighbor Alzada Christen, MD as Consulting Physician (Pulmonary Disease)   ASSESSMENT & PLAN:   Assessment: 1. Acquired hypogammaglobulinemia: -She has been receiving IVIG every 8 weeks since January 2020. -She does have severely low IgA levels.  However he does not have any reactions.   2.  DLBCL, stage IVb: -She was diagnosed 07/08/2009.  Treated with 4 cycles of R-CHOP with intrathecal methotrexate during cycle 2 and 4.  She developed a Pseudomonas sepsis after cycles 2 and 4 and therapy was stopped after 4 cycles.  She also received 2 years of maintenance rituximab. -Last PET scan on 03/20/2017 did not show any evidence of lymphoma.   3.  Health maintenance: -Mammogram on 05/12/22, BI-RADS Category 1.    Plan: 1. Acquired hypogammaglobulinemia: - She had sinus infection x 2 in the last 4 months. - She took prednisone the week before her labs were drawn on 01/17/2023. - Labs show elevated white count 14.4.  Ig G level was 1715.  LFTs are normal. - Reported mid back pain for the last 3 weeks.  Will obtain thoracic spine x-ray and follow-up on it to evaluate compression fracture as she is osteoporotic.. - She will proceed with IVIG every 8 weeks.  RTC 16 weeks for follow-up.   2.  DLBCL, stage IVb: - She does not have any B symptoms.  No palpable adenopathy or splenomegaly on exam.   3.  Normocytic  anemia: - Ferritin today is 436, percent saturation 38 and hemoglobin 11.4.  No indication for parenteral iron therapy.    Orders Placed This Encounter  Procedures   CBC with Differential    Standing Status:   Future    Expected Date:   05/18/2023    Expiration Date:   01/25/2024   Comprehensive metabolic panel    Standing Status:   Future    Expected Date:   05/18/2023    Expiration Date:   01/25/2024   Magnesium    Standing Status:   Future    Expected Date:   05/18/2023    Expiration Date:   01/25/2024   IgG, IgA, IgM    Standing Status:   Future    Expected Date:   05/18/2023    Expiration Date:   01/25/2024   Iron and TIBC ([facility] ONLY)    Standing Status:   Future    Expected Date:   05/18/2023    Expiration Date:   01/25/2024   Ferritin    Standing Status:   Future    Expected Date:   05/18/2023    Expiration Date:   01/25/2024      I,Katie Daubenspeck,acting as a scribe for Doreatha Massed, MD.,have documented all relevant documentation on the behalf of Doreatha Massed, MD,as directed by  Doreatha Massed, MD while in the presence of Doreatha Massed, MD.   I, Doreatha Massed MD, have reviewed the above documentation for accuracy and completeness, and I agree with the above.   Doreatha Massed, MD  1/22/20251:02 PM  CHIEF COMPLAINT:   Diagnosis: acquired hypogammaglobulinemia and DLBCL    Cancer Staging  Non Hodgkin's lymphoma (HCC) Staging form: Lymphoid Neoplasms, AJCC 6th Edition - Clinical: Stage IV - Signed by Ellouise Newer, PA on 11/05/2010    Prior Therapy: R-CHOP x 4 cycles with intrathecal methotrexate during cycles 2 & 4   Current Therapy:  IVIG every 8 weeks; intermittent Feraheme last on 06/14/22    HISTORY OF PRESENT ILLNESS:   Oncology History  Non Hodgkin's lymphoma (HCC)  09/09/2009 Initial Diagnosis   Non Hodgkin's lymphoma (HCC)      INTERVAL HISTORY:   [name] is a 68 y.o. female presenting to  clinic today for follow up of acquired hypogammaglobulinemia and DLBCL. She was last seen by me on 08/09/22.  Today, she states that she is doing well overall. Her appetite level is at 75%. Her energy level is at 60%.  PAST MEDICAL HISTORY:   Past Medical History: Past Medical History:  Diagnosis Date   Allergic rhinitis    Anxiety    Asthma    B12 deficiency 01/18/2015   Cataract    Cavitary lung disease    Chronic sinusitis    COPD (chronic obstructive pulmonary disease) (HCC)    Depression    Diffuse large B-cell lymphoma (HCC)    DVT (deep venous thrombosis) (HCC) 09/25/2014   Essential hypertension    GERD (gastroesophageal reflux disease)    Hematuria 04/07/2016   History of pneumonia 01/2013   Hyperlipidemia    Hypogammaglobulinemia, acquired (HCC)    Iron deficiency anemia 01/18/2015   Myocardial infarction (HCC) 2011   Narcolepsy without cataplexy(347.00) 01/18/2015   Orofacial dyskinesia 04/22/2015   Osteoporosis    Peripheral neuropathy due to chemotherapy (HCC) 05/02/2016   Sleep apnea    SVC syndrome 09/25/2014    Surgical History: Past Surgical History:  Procedure Laterality Date   ABDOMINAL HYSTERECTOMY     Fibroids   BASAL CELL CARCINOMA EXCISION  03/2011   scalp   BREAST SURGERY Right    biopsy   CATARACT EXTRACTION W/PHACO Left 11/17/2014   Procedure: CATARACT EXTRACTION PHACO AND INTRAOCULAR LENS PLACEMENT LEFT EYE;  Surgeon: Gemma Payor, MD;  Location: [facility];  Service: Ophthalmology;  Laterality: Left;  CDE:5.60   CATARACT EXTRACTION W/PHACO Right 12/15/2014   Procedure: CATARACT EXTRACTION PHACO AND INTRAOCULAR LENS PLACEMENT RIGHT EYE CDE=5.16;  Surgeon: Gemma Payor, MD;  Location: [facility];  Service: Ophthalmology;  Laterality: Right;   COLONOSCOPY N/A 11/14/2012   Procedure: COLONOSCOPY;  Surgeon: Malissa Hippo, MD;  Location: [facility];  Service: Endoscopy;  Laterality: N/A;  830   COLONOSCOPY WITH PROPOFOL N/A 01/12/2018   Procedure: COLONOSCOPY  WITH PROPOFOL;  Surgeon: Malissa Hippo, MD;  Location: [facility];  Service: Endoscopy;  Laterality: N/A;  7:30   EYE SURGERY     NASAL SINUS SURGERY     NECK SURGERY     x 2    PERIPHERALLY INSERTED CENTRAL CATHETER INSERTION     PICC Removal     POLYPECTOMY  01/12/2018   Procedure: POLYPECTOMY;  Surgeon: Malissa Hippo, MD;  Location: [facility];  Service: Endoscopy;;  colon   PORT-A-CATH REMOVAL     PORTACATH PLACEMENT  7/11   Removed 6/12   TRACHEOSTOMY     VESICOVAGINAL FISTULA CLOSURE W/ TAH      Social History: Social History   Socioeconomic History   Marital status: Divorced    Spouse name:  Not on file   Number of children: 3   Years of education: 12   Highest education level: Not on file  Occupational History   Occupation: teller    Comment: [employer]  Tobacco Use   Smoking status: Never   Smokeless tobacco: Never  Vaping Use   Vaping status: Never Used  Substance and Sexual Activity   Alcohol use: No   Drug use: No   Sexual activity: Not on file    Comment: hyst  Other Topics Concern   Not on file  Social History Narrative   Originally from [place]. Always lived in [place]. Prior travel to [place] & [place]. Previously worked in a Physicist, medical as a Producer, television/film/video. Currently works as a Haematologist. She has 1 dog currently. She has 1 conures (small parrots). No mold exposure in her home. 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 No hot tub exposure. She enjoys sewing & quilting.    Social Drivers of Health   Financial Resource Strain: Medium Risk (12/03/2019)   Overall Financial Resource Strain (CARDIA)    Difficulty of Paying Living Expenses: Somewhat hard  Food Insecurity: No Food Insecurity (12/03/2019)   Hunger Vital Sign    Worried About Running Out of Food in the Last Year: Never true    Ran Out of Food in the Last Year: Never true  Transportation Needs: No Transportation Needs (12/03/2019)   PRAPARE - Scientist, research (physical sciences) (Medical): No    Lack of Transportation (Non-Medical): No  Physical Activity: Insufficiently Active (12/03/2019)   Exercise Vital Sign    Days of Exercise per Week: 2 days    Minutes of Exercise per Session: 10 min  Stress: No Stress Concern Present (12/03/2019)   [employer] of Occupational Health - Occupational Stress Questionnaire    Feeling of Stress : Not at all  Social Connections: Moderately Integrated (12/03/2019)   Social Connection and Isolation Panel [NHANES]    Frequency of Communication with Friends and Family: More than three times a week    Frequency of Social Gatherings with Friends and Family: More than three times a week    Attends Religious Services: More than 4 times per year    Active Member of [employer] or Organizations: Yes    Attends Engineer, structural: More than 4 times per year    Marital Status: Divorced  Intimate Partner Violence: Not At Risk (12/03/2019)   Humiliation, Afraid, Rape, and Kick questionnaire    Fear of Current or Ex-Partner: No    Emotionally Abused: No    Physically Abused: No    Sexually Abused: No    Family History: Family History  Problem Relation Age of Onset   Emphysema Mother    Stroke Mother    COPD Mother    Heart disease Mother        died in sleep  63   Allergies Father    Asthma Father        as a child   Arthritis Father    Parkinson's disease Father 52   Leukemia Maternal Grandmother    Cancer Maternal Grandmother        Leukemia   Diabetes Brother    Heart attack Brother    Hypertension Brother    Heart disease Brother 40       stents   Hypertension Sister    Hypertension Brother     Current Medications:  Current Outpatient  Medications:    acetaminophen (TYLENOL) 500 MG tablet, Take 1,000 mg by mouth every 6 (six) hours as needed for moderate pain or headache. , Disp: , Rfl:    acyclovir (ZOVIRAX) 400 MG tablet, Take 1 tablet (400 mg total) by mouth 2 (two) times daily., Disp:  60 tablet, Rfl: 6   albuterol (PROAIR HFA) 108 (90 Base) MCG/ACT inhaler, Inhale 2 puffs into the lungs every 6 (six) hours as needed. (Patient taking differently: Inhale 2 puffs into the lungs every 6 (six) hours as needed for wheezing or shortness of breath.), Disp: 8.5 g, Rfl: 2   albuterol (PROVENTIL) (2.5 MG/3ML) 0.083% nebulizer solution, Take 2.5 mg by nebulization every 4 (four) hours as needed for wheezing or shortness of breath., Disp: , Rfl:    amphetamine-dextroamphetamine (ADDERALL) 10 MG tablet, Take 10 mg by mouth every morning. , Disp: , Rfl: 0   Armodafinil 150 MG tablet, Take 150 mg by mouth every morning. , Disp: , Rfl:    aspirin EC 81 MG tablet, Take 81 mg by mouth daily. Swallow whole., Disp: , Rfl:    budesonide-formoterol (SYMBICORT) 160-4.5 MCG/ACT inhaler, Inhale 2 puffs into the lungs 2 (two) times daily., Disp: 1 Inhaler, Rfl: 5   celecoxib (CELEBREX) 200 MG capsule, Take 200 mg by mouth 2 (two) times daily as needed., Disp: , Rfl:    Cholecalciferol (VITAMIN D3 PO), Take 1 capsule by mouth at bedtime., Disp: , Rfl:    citalopram (CELEXA) 10 MG tablet, Take 10 mg by mouth daily., Disp: , Rfl:    cyanocobalamin 1000 MCG tablet, Take 1,000 mcg by mouth daily., Disp: , Rfl:    cyclobenzaprine (FLEXERIL) 5 MG tablet, Take 5 mg by mouth every 8 (eight) hours as needed., Disp: , Rfl:    gabapentin (NEURONTIN) 600 MG tablet, 300 mg 2 (two) times daily. , Disp: , Rfl:    olmesartan (BENICAR) 20 MG tablet, Take 20 mg by mouth daily., Disp: , Rfl:    omeprazole (PRILOSEC) 40 MG capsule, Take 40 mg by mouth daily., Disp: , Rfl:    oxyCODONE-acetaminophen (PERCOCET/ROXICET) 5-325 MG tablet, Take 1 tablet by mouth every 4 (four) hours as needed., Disp: , Rfl:    simvastatin (ZOCOR) 40 MG tablet, Take 40 mg by mouth daily., Disp: , Rfl:    zafirlukast (ACCOLATE) 20 MG tablet, TAKE 1 TABLET BY MOUTH TWICE DAILY., Disp: 60 tablet, Rfl: 0 No current facility-administered medications for  this visit.  Facility-Administered Medications Ordered in Other Visits:    0.9 %  sodium chloride infusion, , Intravenous, Once, Kefalas, Thomas S, PA-C   0.9 %  sodium chloride infusion, , Intravenous, Once, Kefalas, Thomas S, PA-C   acetaminophen (TYLENOL) tablet 650 mg, 650 mg, Oral, Q6H PRN, Kefalas, Thomas S, PA-C   acetaminophen (TYLENOL) tablet 650 mg, 650 mg, Oral, Q6H PRN, Kefalas, Thomas S, PA-C   dextrose 5 % solution, , Intravenous, Continuous, Kefalas, Maurine Minister, PA-C, Stopped at 08/25/15 1400   dextrose 5 % solution, , Intravenous, Continuous, Kefalas, Thomas S, PA-C, Stopped at 07/13/16 1350   dextrose 5 % solution, , Intravenous, Continuous, Hubbard Hartshorn, NP, Stopped at 02/28/17 1230   dextrose 5 % solution, , Intravenous, Continuous, Doreatha Massed, MD, Last Rate: 10 mL/hr at 01/25/23 0949, New Bag at 01/25/23 0949   diphenhydrAMINE (BENADRYL) capsule 25 mg, 25 mg, Oral, Once, Kefalas, Thomas S, PA-C   diphenhydrAMINE (BENADRYL) capsule 25 mg, 25 mg, Oral, Once, Kefalas, Maurine Minister, PA-C  sodium chloride 0.9 % injection 10 mL, 10 mL, Intracatheter, PRN, Ellouise Newer, PA-C, 10 mL at 08/25/15 1125   Allergies: Allergies  Allergen Reactions   Meperidine Hcl Anaphylaxis   Montelukast Sodium Hives and Rash    REVIEW OF SYSTEMS:   Review of Systems  Constitutional:  Negative for chills, fatigue and fever.  HENT:   Negative for lump/mass, mouth sores, nosebleeds, sore throat and trouble swallowing.   Eyes:  Negative for eye problems.  Respiratory:  Positive for shortness of breath. Negative for cough.   Cardiovascular:  Positive for palpitations. Negative for leg swelling.  Gastrointestinal:  Negative for abdominal pain, constipation, diarrhea, nausea and vomiting.  Genitourinary:  Negative for bladder incontinence, difficulty urinating, dysuria, frequency, hematuria and nocturia.   Musculoskeletal:  Positive for back pain. Negative for arthralgias, flank  pain, myalgias and neck pain.  Skin:  Negative for itching and rash.  Neurological:  Positive for numbness. Negative for dizziness and headaches.  Hematological:  Does not bruise/bleed easily.  Psychiatric/Behavioral:  Negative for depression, sleep disturbance and suicidal ideas. The patient is not nervous/anxious.   All other systems reviewed and are negative.    VITALS:   Blood pressure 120/61, pulse 84, temperature 97.8 F (36.6 C), temperature source Oral, resp. rate 16, weight 124 lb 5.4 oz (56.4 kg), SpO2 96%.  Wt Readings from Last 3 Encounters:  01/25/23 124 lb 5.4 oz (56.4 kg)  11/29/22 125 lb 3.2 oz (56.8 kg)  10/04/22 126 lb 12.8 oz (57.5 kg)    Body mass index is 23.49 kg/m.  Performance status (ECOG): 1 - Symptomatic but completely ambulatory  PHYSICAL EXAM:   Physical Exam Vitals and nursing note reviewed. Exam conducted with a chaperone present.  Constitutional:      Appearance: Normal appearance.  Cardiovascular:     Rate and Rhythm: Normal rate and regular rhythm.     Pulses: Normal pulses.     Heart sounds: Normal heart sounds.  Pulmonary:     Effort: Pulmonary effort is normal.     Breath sounds: Normal breath sounds.  Abdominal:     Palpations: Abdomen is soft. There is no hepatomegaly, splenomegaly or mass.     Tenderness: There is no abdominal tenderness.  Musculoskeletal:     Right lower leg: No edema.     Left lower leg: No edema.  Lymphadenopathy:     Cervical: No cervical adenopathy.     Right cervical: No superficial, deep or posterior cervical adenopathy.    Left cervical: No superficial, deep or posterior cervical adenopathy.     Upper Body:     Right upper body: No supraclavicular or axillary adenopathy.     Left upper body: No supraclavicular or axillary adenopathy.  Neurological:     General: No focal deficit present.     Mental Status: She is alert and oriented to person, place, and time.  Psychiatric:        Mood and Affect: Mood  normal.        Behavior: Behavior normal.     LABS:   CBC     Component Value Date/Time   WBC 14.4 (H) 01/17/2023 0836   RBC 3.59 (L) 01/17/2023 0836   HGB 11.4 (L) 01/17/2023 0836   HCT 35.9 (L) 01/17/2023 0836   PLT 260 01/17/2023 0836   MCV 100.0 01/17/2023 0836   MCH 31.8 01/17/2023 0836   MCHC 31.8 01/17/2023 0836   RDW 13.1 01/17/2023 0836   LYMPHSABS 2.1 01/17/2023  0836   MONOABS 0.6 01/17/2023 0836   EOSABS 0.2 01/17/2023 0836   BASOSABS 0.0 01/17/2023 0836    CMP      Component Value Date/Time   NA 139 01/17/2023 0836   K 3.3 (L) 01/17/2023 0836   CL 101 01/17/2023 0836   CO2 28 01/17/2023 0836   GLUCOSE 157 (H) 01/17/2023 0836   BUN 16 01/17/2023 0836   CREATININE 0.93 01/17/2023 0836   CALCIUM 8.6 (L) 01/17/2023 0836   PROT 6.8 01/17/2023 0836   ALBUMIN 2.7 (L) 01/17/2023 0836   AST 18 01/17/2023 0836   ALT 26 01/17/2023 0836   ALKPHOS 109 01/17/2023 0836   BILITOT 0.6 01/17/2023 0836   GFRNONAA >60 01/17/2023 0836   GFRAA >60 10/07/2019 1009     No results found for: "CEA1", "CEA" / No results found for: "CEA1", "CEA" No results found for: "PSA1" No results found for: "CAN199" No results found for: "CAN125"  Lab Results  Component Value Date   TOTALPROTELP 6.3 02/11/2014   TOTALPROTELP 6.3 02/11/2014   ALBUMINELP 57.6 02/11/2014   A1GS 6.1 (H) 02/11/2014   A2GS 11.7 02/11/2014   BETS 6.4 02/11/2014   BETA2SER 5.3 02/11/2014   GAMS 12.9 02/11/2014   MSPIKE NOT DETECTED 02/11/2014   SPEI (NOTE) 02/11/2014   Lab Results  Component Value Date   TIBC 154 (L) 01/17/2023   TIBC 189 (L) 11/29/2022   TIBC 169 (L) 08/02/2022   FERRITIN 436 (H) 01/17/2023   FERRITIN 223 11/29/2022   FERRITIN 227 08/02/2022   IRONPCTSAT 38 (H) 01/17/2023   IRONPCTSAT 30 11/29/2022   IRONPCTSAT 44 (H) 08/02/2022   Lab Results  Component Value Date   LDH 116 01/17/2023   LDH 162 11/29/2022   LDH 145 08/02/2022     STUDIES:   No results found.

## 2023-01-25 ENCOUNTER — Inpatient Hospital Stay: Payer: Medicare Other

## 2023-01-25 ENCOUNTER — Ambulatory Visit (HOSPITAL_COMMUNITY)
Admission: RE | Admit: 2023-01-25 | Discharge: 2023-01-25 | Disposition: A | Discharge status: Home / Self Care | Payer: Medicare Other | Source: Ambulatory Visit | Attending: Hematology | Admitting: Hematology

## 2023-01-25 ENCOUNTER — Inpatient Hospital Stay (HOSPITAL_BASED_OUTPATIENT_CLINIC_OR_DEPARTMENT_OTHER): Payer: Medicare Other | Admitting: Hematology

## 2023-01-25 ENCOUNTER — Other Ambulatory Visit: Payer: Self-pay | Admitting: Hematology

## 2023-01-25 VITALS — BP 120/61 | HR 84 | Temp 97.8°F | Resp 16 | Wt 124.3 lb

## 2023-01-25 VITALS — BP 147/76 | HR 85 | Temp 97.9°F | Resp 18

## 2023-01-25 DIAGNOSIS — M5489 Other dorsalgia: Secondary | ICD-10-CM | POA: Insufficient documentation

## 2023-01-25 DIAGNOSIS — D801 Nonfamilial hypogammaglobulinemia: Secondary | ICD-10-CM

## 2023-01-25 DIAGNOSIS — D509 Iron deficiency anemia, unspecified: Secondary | ICD-10-CM | POA: Diagnosis not present

## 2023-01-25 DIAGNOSIS — M546 Pain in thoracic spine: Secondary | ICD-10-CM | POA: Diagnosis not present

## 2023-01-25 DIAGNOSIS — C83398 Diffuse large b-cell lymphoma of other extranodal and solid organ sites: Secondary | ICD-10-CM | POA: Diagnosis not present

## 2023-01-25 DIAGNOSIS — M438X4 Other specified deforming dorsopathies, thoracic region: Secondary | ICD-10-CM | POA: Diagnosis not present

## 2023-01-25 DIAGNOSIS — M47814 Spondylosis without myelopathy or radiculopathy, thoracic region: Secondary | ICD-10-CM | POA: Diagnosis not present

## 2023-01-25 MED ORDER — DEXTROSE 5 % IV SOLN
INTRAVENOUS | Status: DC
Start: 2023-01-25 — End: 2023-01-25

## 2023-01-25 MED ORDER — IMMUNE GLOBULIN (HUMAN) 10 GM/100ML IV SOLN
25.0000 g | Freq: Once | INTRAVENOUS | Status: AC
  Administered 2023-01-25: 25 g via INTRAVENOUS
  Filled 2023-01-25: qty 250

## 2023-01-25 NOTE — Patient Instructions (Signed)

## 2023-01-25 NOTE — Progress Notes (Signed)
Patient okay for IVIG today per Dr. Ellin Saba. Patient reports taking tylenol and Benadryl at home. Patient tolerated IVIG infusion with no complaints voiced. Peripheral IV site clean and dry with good blood return noted before and after infusion. Band aid applied. VSS with discharge and left in satisfactory condition with no s/s of distress noted.

## 2023-01-25 NOTE — Patient Instructions (Signed)
[facility] [place] - A DEPT OF [facility]  Discharge Instructions: Thank you for choosing [facility] to provide your oncology and hematology care.  If you have a lab appointment with the Cancer Center - please note that after April 8th, 2024, all labs will be drawn in the cancer center.  You do not have to check in or register with the main entrance as you have in the past but will complete your check-in in the cancer center.  Wear comfortable clothing and clothing appropriate for easy access to any Portacath or PICC line.   We strive to give you quality time with your provider. You may need to reschedule your appointment if you arrive late (15 or more minutes).  Arriving late affects you and other patients whose appointments are after yours.  Also, if you miss three or more appointments without notifying the office, you may be dismissed from the clinic at the provider's discretion.      For prescription refill requests, have your pharmacy contact our office and allow 72 hours for refills to be completed.    Today you received the following chemotherapy and/or immunotherapy agents IVIG, return as scheduled.   To help prevent nausea and vomiting after your treatment, we encourage you to take your nausea medication as directed.  BELOW ARE SYMPTOMS THAT SHOULD BE REPORTED IMMEDIATELY: *FEVER GREATER THAN 100.4 F (38 C) OR HIGHER *CHILLS OR SWEATING *NAUSEA AND VOMITING THAT IS NOT CONTROLLED WITH YOUR NAUSEA MEDICATION *UNUSUAL SHORTNESS OF BREATH *UNUSUAL BRUISING OR BLEEDING *URINARY PROBLEMS (pain or burning when urinating, or frequent urination) *BOWEL PROBLEMS (unusual diarrhea, constipation, pain near the anus) TENDERNESS IN MOUTH AND THROAT WITH OR WITHOUT PRESENCE OF ULCERS (sore throat, sores in mouth, or a toothache) UNUSUAL RASH, SWELLING OR PAIN  UNUSUAL VAGINAL DISCHARGE OR ITCHING   Items with * indicate a potential emergency and should  be followed up as soon as possible or go to the Emergency Department if any problems should occur.  Please show the CHEMOTHERAPY ALERT CARD or IMMUNOTHERAPY ALERT CARD at check-in to the Emergency Department and triage nurse.  Should you have questions after your visit or need to cancel or reschedule your appointment, please contact [facility] [place] - A DEPT OF [facility] [phone]  and follow the prompts.  Office hours are 8:00 a.m. to 4:30 p.m. Monday - Friday. Please note that voicemails left after 4:00 p.m. may not be returned until the following business day.  We are closed weekends and major holidays. You have access to a nurse at all times for urgent questions. Please call the main number to the clinic [phone] and follow the prompts.  For any non-urgent questions, you may also contact your provider using MyChart. We now offer e-Visits for anyone 10 and older to request care online for non-urgent symptoms. For details visit [email or web address].   Also download the MyChart app! Go to the app store, search "MyChart", open the app, select [place], and log in with your MyChart username and password.

## 2023-02-21 DIAGNOSIS — H524 Presbyopia: Secondary | ICD-10-CM | POA: Diagnosis not present

## 2023-03-02 DIAGNOSIS — L84 Corns and callosities: Secondary | ICD-10-CM | POA: Diagnosis not present

## 2023-03-02 DIAGNOSIS — M79675 Pain in left toe(s): Secondary | ICD-10-CM | POA: Diagnosis not present

## 2023-03-15 DIAGNOSIS — L821 Other seborrheic keratosis: Secondary | ICD-10-CM | POA: Diagnosis not present

## 2023-03-21 ENCOUNTER — Other Ambulatory Visit (HOSPITAL_COMMUNITY): Payer: Self-pay | Admitting: Internal Medicine

## 2023-03-21 ENCOUNTER — Ambulatory Visit (HOSPITAL_COMMUNITY)
Admission: RE | Admit: 2023-03-21 | Discharge: 2023-03-21 | Disposition: A | Discharge status: Home / Self Care | Source: Ambulatory Visit | Attending: Internal Medicine | Admitting: Internal Medicine

## 2023-03-21 DIAGNOSIS — R053 Chronic cough: Secondary | ICD-10-CM | POA: Insufficient documentation

## 2023-03-21 DIAGNOSIS — J449 Chronic obstructive pulmonary disease, unspecified: Secondary | ICD-10-CM | POA: Diagnosis not present

## 2023-03-21 DIAGNOSIS — I700 Atherosclerosis of aorta: Secondary | ICD-10-CM | POA: Diagnosis not present

## 2023-03-21 DIAGNOSIS — Z20828 Contact with and (suspected) exposure to other viral communicable diseases: Secondary | ICD-10-CM | POA: Diagnosis not present

## 2023-03-21 DIAGNOSIS — N183 Chronic kidney disease, stage 3 unspecified: Secondary | ICD-10-CM | POA: Diagnosis not present

## 2023-03-21 DIAGNOSIS — R509 Fever, unspecified: Secondary | ICD-10-CM | POA: Diagnosis not present

## 2023-03-21 DIAGNOSIS — R918 Other nonspecific abnormal finding of lung field: Secondary | ICD-10-CM | POA: Diagnosis not present

## 2023-03-21 DIAGNOSIS — C8580 Other specified types of non-Hodgkin lymphoma, unspecified site: Secondary | ICD-10-CM | POA: Diagnosis not present

## 2023-03-21 DIAGNOSIS — Z6822 Body mass index (BMI) 22.0-22.9, adult: Secondary | ICD-10-CM | POA: Diagnosis not present

## 2023-03-21 DIAGNOSIS — R6889 Other general symptoms and signs: Secondary | ICD-10-CM | POA: Diagnosis not present

## 2023-03-23 ENCOUNTER — Inpatient Hospital Stay: Payer: Medicare Other

## 2023-03-30 ENCOUNTER — Other Ambulatory Visit: Payer: Self-pay | Admitting: Hematology

## 2023-03-30 DIAGNOSIS — D801 Nonfamilial hypogammaglobulinemia: Secondary | ICD-10-CM

## 2023-04-06 ENCOUNTER — Inpatient Hospital Stay

## 2023-04-06 ENCOUNTER — Inpatient Hospital Stay: Attending: Hematology

## 2023-04-06 VITALS — BP 101/61 | HR 77 | Temp 98.2°F | Resp 18 | Wt 125.2 lb

## 2023-04-06 DIAGNOSIS — D509 Iron deficiency anemia, unspecified: Secondary | ICD-10-CM | POA: Insufficient documentation

## 2023-04-06 DIAGNOSIS — D801 Nonfamilial hypogammaglobulinemia: Secondary | ICD-10-CM | POA: Diagnosis not present

## 2023-04-06 DIAGNOSIS — C83398 Diffuse large b-cell lymphoma of other extranodal and solid organ sites: Secondary | ICD-10-CM | POA: Diagnosis not present

## 2023-04-06 LAB — COMPREHENSIVE METABOLIC PANEL WITH GFR
ALT: 13 U/L (ref 0–44)
AST: 21 U/L (ref 15–41)
Albumin: 3.4 g/dL — ABNORMAL LOW (ref 3.5–5.0)
Alkaline Phosphatase: 97 U/L (ref 38–126)
Anion gap: 9 (ref 5–15)
BUN: 25 mg/dL — ABNORMAL HIGH (ref 8–23)
CO2: 26 mmol/L (ref 22–32)
Calcium: 9.1 mg/dL (ref 8.9–10.3)
Chloride: 101 mmol/L (ref 98–111)
Creatinine, Ser: 1.23 mg/dL — ABNORMAL HIGH (ref 0.44–1.00)
GFR, Estimated: 48 mL/min — ABNORMAL LOW (ref >60)
Glucose, Bld: 118 mg/dL — ABNORMAL HIGH (ref 70–99)
Potassium: 4.4 mmol/L (ref 3.5–5.1)
Sodium: 136 mmol/L (ref 135–145)
Total Bilirubin: 0.7 mg/dL (ref 0.0–1.2)
Total Protein: 8 g/dL (ref 6.5–8.1)

## 2023-04-06 LAB — IRON AND TIBC
Iron: 26 ug/dL — ABNORMAL LOW (ref 28–170)
Saturation Ratios: 12 % (ref 10.4–31.8)
TIBC: 209 ug/dL — ABNORMAL LOW (ref 250–450)
UIBC: 183 ug/dL

## 2023-04-06 LAB — CBC WITH DIFFERENTIAL/PLATELET
Abs Immature Granulocytes: 0.05 10*3/uL (ref 0.00–0.07)
Basophils Absolute: 0 10*3/uL (ref 0.0–0.1)
Basophils Relative: 0 %
Eosinophils Absolute: 0.1 10*3/uL (ref 0.0–0.5)
Eosinophils Relative: 1 %
HCT: 37.5 % (ref 36.0–46.0)
Hemoglobin: 11.9 g/dL — ABNORMAL LOW (ref 12.0–15.0)
Immature Granulocytes: 1 %
Lymphocytes Relative: 16 %
Lymphs Abs: 1.5 10*3/uL (ref 0.7–4.0)
MCH: 31.6 pg (ref 26.0–34.0)
MCHC: 31.7 g/dL (ref 30.0–36.0)
MCV: 99.7 fL (ref 80.0–100.0)
Monocytes Absolute: 0.8 10*3/uL (ref 0.1–1.0)
Monocytes Relative: 8 %
Neutro Abs: 7 10*3/uL (ref 1.7–7.7)
Neutrophils Relative %: 74 %
Platelets: 218 10*3/uL (ref 150–400)
RBC: 3.76 MIL/uL — ABNORMAL LOW (ref 3.87–5.11)
RDW: 13.2 % (ref 11.5–15.5)
WBC: 9.5 10*3/uL (ref 4.0–10.5)
nRBC: 0 % (ref 0.0–0.2)

## 2023-04-06 LAB — FERRITIN: Ferritin: 301 ng/mL (ref 11–307)

## 2023-04-06 LAB — MAGNESIUM: Magnesium: 2.1 mg/dL (ref 1.7–2.4)

## 2023-04-06 MED ORDER — ACETAMINOPHEN 325 MG PO TABS
650.0000 mg | ORAL_TABLET | Freq: Four times a day (QID) | ORAL | Status: DC | PRN
Start: 2023-04-06 — End: 2023-04-06

## 2023-04-06 MED ORDER — DEXTROSE 5 % IV SOLN
INTRAVENOUS | Status: DC
Start: 2023-04-06 — End: 2023-04-06

## 2023-04-06 MED ORDER — IMMUNE GLOBULIN (HUMAN) 10 GM/100ML IV SOLN
25.0000 g | Freq: Once | INTRAVENOUS | Status: DC
  Filled 2023-04-06: qty 250

## 2023-04-06 MED ORDER — IMMUNE GLOBULIN (HUMAN) 10 GM/100ML IV SOLN
30.0000 g | Freq: Once | INTRAVENOUS | Status: AC
Start: 2023-04-06 — End: 2023-04-06
  Administered 2023-04-06: 30 g via INTRAVENOUS
  Filled 2023-04-06: qty 300

## 2023-04-06 MED ORDER — DIPHENHYDRAMINE HCL 25 MG PO CAPS: 25.0000 mg | ORAL_CAPSULE | Freq: Once | ORAL | Status: DC

## 2023-04-06 NOTE — Progress Notes (Signed)
 5 gm vials of Privigen are unavailable at present.  Modify dose to 30 gm total.  Ok per Dr Ellin Saba.  Pryor Ochoa, PharmD

## 2023-04-06 NOTE — Progress Notes (Signed)
 Patient reports taking tylenol and Benadryl at home. Patient tolerated IVIG infusion with no complaints voiced. Peripheral IV site clean and dry with good blood return noted before and after infusion. Band aid applied. VSS with discharge and left in satisfactory condition with no s/s of distress noted. All follow ups as scheduled.   Diamonds L[employer]

## 2023-04-07 LAB — IGG, IGA, IGM
IgA: <5 mg/dL — ABNORMAL LOW (ref 87–352)
IgG (Immunoglobin G), Serum: 1804 mg/dL — ABNORMAL HIGH (ref 586–1602)
IgM (Immunoglobulin M), Srm: 87 mg/dL (ref 26–217)

## 2023-04-12 DIAGNOSIS — J449 Chronic obstructive pulmonary disease, unspecified: Secondary | ICD-10-CM | POA: Diagnosis not present

## 2023-04-12 DIAGNOSIS — J302 Other seasonal allergic rhinitis: Secondary | ICD-10-CM | POA: Diagnosis not present

## 2023-04-12 DIAGNOSIS — Z6822 Body mass index (BMI) 22.0-22.9, adult: Secondary | ICD-10-CM | POA: Diagnosis not present

## 2023-04-12 DIAGNOSIS — F909 Attention-deficit hyperactivity disorder, unspecified type: Secondary | ICD-10-CM | POA: Diagnosis not present

## 2023-05-03 DIAGNOSIS — E785 Hyperlipidemia, unspecified: Secondary | ICD-10-CM | POA: Diagnosis not present

## 2023-05-03 DIAGNOSIS — J449 Chronic obstructive pulmonary disease, unspecified: Secondary | ICD-10-CM | POA: Diagnosis not present

## 2023-05-03 NOTE — Progress Notes (Deleted)
 Devra Fontana, female    DOB: [birth year]    MRN: [id number]   Brief patient profile:  48  yowf  *** referred to pulmonary clinic in [place]  05/04/2023 by *** for ***   Prev seen [facility] 2020 dx mod asthma    History of Present Illness  05/04/2023  Pulmonary/ 1st office eval/ [facility] / [facility]  No chief complaint on file.    Dyspnea:  *** Cough: *** Sleep: *** SABA use: *** 02: *** LDSCT:***  No obvious day to day or daytime pattern/variability or assoc excess/ purulent sputum or mucus plugs or hemoptysis or cp or chest tightness, subjective wheeze or overt sinus or hb symptoms.    Also denies any obvious fluctuation of symptoms with weather or environmental changes or other aggravating or alleviating factors except as outlined above   No unusual exposure hx or h/o childhood pna/ asthma or knowledge of premature birth.  Current Allergies, Complete Past Medical History, Past Surgical History, Family History, and Social History were reviewed in [employer] record.  ROS  The following are not active complaints unless bolded Hoarseness, sore throat, dysphagia, dental problems, itching, sneezing,  nasal congestion or discharge of excess mucus or purulent secretions, ear ache,   fever, chills, sweats, unintended wt loss or wt gain, classically pleuritic or exertional cp,  orthopnea pnd or arm/hand swelling  or leg swelling, presyncope, palpitations, abdominal pain, anorexia, nausea, vomiting, diarrhea  or change in bowel habits or change in bladder habits, change in stools or change in urine, dysuria, hematuria,  rash, arthralgias, visual complaints, headache, numbness, weakness or ataxia or problems with walking or coordination,  change in mood or  memory.            Outpatient Medications Prior to Visit  Medication Sig Dispense Refill   acetaminophen  (TYLENOL ) 500 MG tablet Take 1,000 mg by mouth every 6 (six) hours as needed for moderate pain or  headache.      acyclovir  (ZOVIRAX ) 400 MG tablet TAKE ONE TABLET BY MOUTH TWICE DAILY. 60 tablet 6   albuterol  (PROAIR  HFA) 108 (90 Base) MCG/ACT inhaler Inhale 2 puffs into the lungs every 6 (six) hours as needed. (Patient taking differently: Inhale 2 puffs into the lungs every 6 (six) hours as needed for wheezing or shortness of breath.) 8.5 g 2   albuterol  (PROVENTIL ) (2.5 MG/3ML) 0.083% nebulizer solution Take 2.5 mg by nebulization every 4 (four) hours as needed for wheezing or shortness of breath.     amphetamine-dextroamphetamine (ADDERALL) 10 MG tablet Take 10 mg by mouth every morning.   0   Armodafinil 150 MG tablet Take 150 mg by mouth every morning.      aspirin EC 81 MG tablet Take 81 mg by mouth daily. Swallow whole.     budesonide -formoterol  (SYMBICORT ) 160-4.5 MCG/ACT inhaler Inhale 2 puffs into the lungs 2 (two) times daily. 1 Inhaler 5   celecoxib (CELEBREX) 200 MG capsule Take 200 mg by mouth 2 (two) times daily as needed.     Cholecalciferol (VITAMIN D3 PO) Take 1 capsule by mouth at bedtime.     citalopram  (CELEXA ) 10 MG tablet Take 10 mg by mouth daily.     cyanocobalamin  1000 MCG tablet Take 1,000 mcg by mouth daily.     cyclobenzaprine (FLEXERIL) 5 MG tablet Take 5 mg by mouth every 8 (eight) hours as needed.     gabapentin  (NEURONTIN ) 600 MG tablet 300 mg 2 (two) times daily.  olmesartan (BENICAR) 20 MG tablet Take 20 mg by mouth daily.     omeprazole  (PRILOSEC) 40 MG capsule Take 40 mg by mouth daily.     oxyCODONE-acetaminophen  (PERCOCET/ROXICET) 5-325 MG tablet Take 1 tablet by mouth every 4 (four) hours as needed.     simvastatin  (ZOCOR ) 40 MG tablet Take 40 mg by mouth daily.     zafirlukast  (ACCOLATE ) 20 MG tablet TAKE 1 TABLET BY MOUTH TWICE DAILY. 60 tablet 0   Facility-Administered Medications Prior to Visit  Medication Dose Route Frequency Provider Last Rate Last Admin   0.9 %  sodium chloride  infusion   Intravenous Once Kefalas, Thomas S, PA-C       0.9  %  sodium chloride  infusion   Intravenous Once Kefalas, Thomas S, PA-C       acetaminophen  (TYLENOL ) tablet 650 mg  650 mg Oral Q6H PRN Kefalas, Thomas S, PA-C       acetaminophen  (TYLENOL ) tablet 650 mg  650 mg Oral Q6H PRN Kefalas, Thomas S, PA-C       dextrose  5 % solution   Intravenous Continuous Kefalas, Thomas S, PA-C   Stopped at 08/25/15 1400   dextrose  5 % solution   Intravenous Continuous Kefalas, Linn Rich, PA-C   Stopped at 07/13/16 1350   dextrose  5 % solution   Intravenous Continuous Alanna Alley, NP   Stopped at 02/28/17 1230   diphenhydrAMINE  (BENADRYL ) capsule 25 mg  25 mg Oral Once Kefalas, Thomas S, PA-C       diphenhydrAMINE  (BENADRYL ) capsule 25 mg  25 mg Oral Once Kefalas, Thomas S, PA-C       sodium chloride  0.9 % injection 10 mL  10 mL Intracatheter PRN Doretta Gant, PA-C   10 mL at 08/25/15 1125    Past Medical History:  Diagnosis Date   Allergic rhinitis    Anxiety    Asthma    B12 deficiency 01/18/2015   Cataract    Cavitary lung disease    Chronic sinusitis    COPD (chronic obstructive pulmonary disease) (HCC)    Depression    Diffuse large B-cell lymphoma (HCC)    DVT (deep venous thrombosis) (HCC) 09/25/2014   Essential hypertension    GERD (gastroesophageal reflux disease)    Hematuria 04/07/2016   History of pneumonia 01/2013   Hyperlipidemia    Hypogammaglobulinemia, acquired (HCC)    Iron  deficiency anemia 01/18/2015   Myocardial infarction (HCC) 2011   Narcolepsy without cataplexy(347.00) 01/18/2015   Orofacial dyskinesia 04/22/2015   Osteoporosis    Peripheral neuropathy due to chemotherapy (HCC) 05/02/2016   Sleep apnea    SVC syndrome 09/25/2014      Objective:     There were no vitals taken for this visit.         Assessment   No problem-specific Assessment & Plan notes found for this encounter.     Vernestine Gondola, MD 05/03/2023

## 2023-05-04 ENCOUNTER — Encounter: Payer: Self-pay | Admitting: Internal Medicine

## 2023-05-04 ENCOUNTER — Ambulatory Visit: Admitting: Internal Medicine

## 2023-05-11 ENCOUNTER — Inpatient Hospital Stay: Payer: Medicare Other

## 2023-05-12 DIAGNOSIS — K080 Exfoliation of teeth due to systemic causes: Secondary | ICD-10-CM | POA: Diagnosis not present

## 2023-05-18 ENCOUNTER — Inpatient Hospital Stay: Payer: Medicare Other

## 2023-05-18 ENCOUNTER — Inpatient Hospital Stay: Payer: Medicare Other | Admitting: Hematology

## 2023-05-24 ENCOUNTER — Other Ambulatory Visit: Payer: Self-pay

## 2023-05-24 DIAGNOSIS — D801 Nonfamilial hypogammaglobulinemia: Secondary | ICD-10-CM

## 2023-05-24 DIAGNOSIS — D649 Anemia, unspecified: Secondary | ICD-10-CM

## 2023-05-24 DIAGNOSIS — D508 Other iron deficiency anemias: Secondary | ICD-10-CM

## 2023-05-24 DIAGNOSIS — C8338 Diffuse large B-cell lymphoma, lymph nodes of multiple sites: Secondary | ICD-10-CM

## 2023-05-25 ENCOUNTER — Inpatient Hospital Stay: Attending: Hematology

## 2023-05-25 DIAGNOSIS — D801 Nonfamilial hypogammaglobulinemia: Secondary | ICD-10-CM | POA: Insufficient documentation

## 2023-05-25 DIAGNOSIS — C83398 Diffuse large b-cell lymphoma of other extranodal and solid organ sites: Secondary | ICD-10-CM | POA: Diagnosis not present

## 2023-05-25 DIAGNOSIS — C8338 Diffuse large B-cell lymphoma, lymph nodes of multiple sites: Secondary | ICD-10-CM

## 2023-05-25 DIAGNOSIS — D508 Other iron deficiency anemias: Secondary | ICD-10-CM

## 2023-05-25 DIAGNOSIS — D509 Iron deficiency anemia, unspecified: Secondary | ICD-10-CM | POA: Diagnosis not present

## 2023-05-25 DIAGNOSIS — D649 Anemia, unspecified: Secondary | ICD-10-CM

## 2023-05-25 LAB — LACTATE DEHYDROGENASE: LDH: 146 U/L (ref 98–192)

## 2023-05-25 LAB — COMPREHENSIVE METABOLIC PANEL WITH GFR
ALT: 11 U/L (ref 0–44)
AST: 19 U/L (ref 15–41)
Albumin: 3.4 g/dL — ABNORMAL LOW (ref 3.5–5.0)
Alkaline Phosphatase: 103 U/L (ref 38–126)
Anion gap: 7 (ref 5–15)
BUN: 16 mg/dL (ref 8–23)
CO2: 29 mmol/L (ref 22–32)
Calcium: 8.9 mg/dL (ref 8.9–10.3)
Chloride: 101 mmol/L (ref 98–111)
Creatinine, Ser: 0.96 mg/dL (ref 0.44–1.00)
GFR, Estimated: >60 mL/min (ref >60)
Glucose, Bld: 77 mg/dL (ref 70–99)
Potassium: 3.8 mmol/L (ref 3.5–5.1)
Sodium: 137 mmol/L (ref 135–145)
Total Bilirubin: 0.6 mg/dL (ref 0.0–1.2)
Total Protein: 7.3 g/dL (ref 6.5–8.1)

## 2023-05-25 LAB — CBC WITH DIFFERENTIAL/PLATELET
Abs Immature Granulocytes: 0.01 10*3/uL (ref 0.00–0.07)
Basophils Absolute: 0.1 10*3/uL (ref 0.0–0.1)
Basophils Relative: 1 %
Eosinophils Absolute: 0.2 10*3/uL (ref 0.0–0.5)
Eosinophils Relative: 2 %
HCT: 38.6 % (ref 36.0–46.0)
Hemoglobin: 12.4 g/dL (ref 12.0–15.0)
Immature Granulocytes: 0 %
Lymphocytes Relative: 22 %
Lymphs Abs: 1.8 10*3/uL (ref 0.7–4.0)
MCH: 32.1 pg (ref 26.0–34.0)
MCHC: 32.1 g/dL (ref 30.0–36.0)
MCV: 100 fL (ref 80.0–100.0)
Monocytes Absolute: 0.7 10*3/uL (ref 0.1–1.0)
Monocytes Relative: 8 %
Neutro Abs: 5.5 10*3/uL (ref 1.7–7.7)
Neutrophils Relative %: 67 %
Platelets: 186 10*3/uL (ref 150–400)
RBC: 3.86 MIL/uL — ABNORMAL LOW (ref 3.87–5.11)
RDW: 12.5 % (ref 11.5–15.5)
WBC: 8.2 10*3/uL (ref 4.0–10.5)
nRBC: 0 % (ref 0.0–0.2)

## 2023-05-25 LAB — IRON AND TIBC
Iron: 60 ug/dL (ref 28–170)
Saturation Ratios: 30 % (ref 10.4–31.8)
TIBC: 202 ug/dL — ABNORMAL LOW (ref 250–450)
UIBC: 142 ug/dL

## 2023-05-25 LAB — MAGNESIUM: Magnesium: 1.8 mg/dL (ref 1.7–2.4)

## 2023-05-25 LAB — FERRITIN: Ferritin: 171 ng/mL (ref 11–307)

## 2023-05-26 LAB — IGG, IGA, IGM
IgA: <5 mg/dL — ABNORMAL LOW (ref 87–352)
IgG (Immunoglobin G), Serum: 1707 mg/dL — ABNORMAL HIGH (ref 586–1602)
IgM (Immunoglobulin M), Srm: 85 mg/dL (ref 26–217)

## 2023-05-31 DIAGNOSIS — K080 Exfoliation of teeth due to systemic causes: Secondary | ICD-10-CM | POA: Diagnosis not present

## 2023-05-31 NOTE — Progress Notes (Signed)
 [facility] [address], [place] [address]    Clinic Day:  06/01/2023  Referring physician: Minus Amel, MD  Patient Care Team: Minus Amel, MD as PCP - General (Family Medicine) Gerard Knight, MD as PCP - Cardiology (Cardiology) Ruby Corporal, MD (Inactive) as Consulting Physician (Gastroenterology) Bryson Carbine, MD as Consulting Physician (Nephrology) Gioia Laity, MD as Consulting Physician (Oncology) Jeneen Mire, MD as Consulting Physician (Neurology) Lorry Rouge, MD as Referring Physician (Dermatology) Lucendia Rusk, DO (Optometry) Lyndal Sandy Delora Ferry, MD as Consulting Physician (Pulmonary Disease) Diamond Formica, MD as Consulting Physician (Pulmonary Disease)   ASSESSMENT & PLAN:   Assessment: 1. Acquired hypogammaglobulinemia: -[name] has been receiving IVIG every 8 weeks since January 2020. -[name] does have severely low IgA levels.  However he does not have any reactions.   2.  DLBCL, stage IVb: -[name] was diagnosed 07/08/2009.  Treated with 4 cycles of R-CHOP with intrathecal methotrexate during cycle 2 and 4.  [name] developed a Pseudomonas sepsis after cycles 2 and 4 and therapy was stopped after 4 cycles.  [name] also received 2 years of maintenance rituximab . -Last PET scan on 03/20/2017 did not show any evidence of lymphoma.   3.  Health maintenance: -Mammogram on 05/12/22, BI-RADS Category 1.    Plan: 1. Acquired hypogammaglobulinemia: - [name] is receiving IVIG every 8 weeks. - [name] reported sinus congestion, few episodes and did not require any antibiotics.  Prior to last visit 3 months ago [name] had sinus infection x 2.  No other infections reported. - We reviewed labs from 05/25/2023: Normal LFTs.  LDH normal.  CBC was normal.  IgG level is elevated at 1707.  It was elevated last 4 times ranging between 1700-1800. - I have recommended switching IVIG to every 12 weeks as [name] trough levels are high.  Recommend follow-up in 4 weeks with  quantitative immunoglobulins.  If no increase in infections, may continue every 12 weeks.   2.  DLBCL, stage IVb: - [name] does not have any B symptoms.  No palpable adenopathy or splenomegaly.   3.  Normocytic anemia: - Hemoglobin is normal at 12.4.  Last ferritin is 171 saturation 30.    No orders of the defined types were placed in this encounter.     Nadeen Augusta Teague,acting as a Neurosurgeon for Paulett Boros, MD.,have documented all relevant documentation on the behalf of Paulett Boros, MD,as directed by  Paulett Boros, MD while in the presence of Paulett Boros, MD.  I, Paulett Boros MD, have reviewed the above documentation for accuracy and completeness, and I agree with the above.    Paulett Boros, MD   5/29/20254:48 PM  CHIEF COMPLAINT:   Diagnosis: acquired hypogammaglobulinemia and DLBCL    Cancer Staging  Non Hodgkin's lymphoma (HCC) Staging form: Lymphoid Neoplasms, AJCC 6th Edition - Clinical: Stage IV - Signed by Doretta Gant, PA on 11/05/2010    Prior Therapy: R-CHOP x 4 cycles with intrathecal methotrexate during cycles 2 & 4   Current Therapy:  IVIG every 8 weeks; intermittent Feraheme  last on 06/14/22    HISTORY OF PRESENT ILLNESS:   Oncology History  Non Hodgkin's lymphoma (HCC)  09/09/2009 Initial Diagnosis   Non Hodgkin's lymphoma (HCC)      INTERVAL HISTORY:   [name] is a 68 y.o. female presenting to clinic today for follow up of acquired hypogammaglobulinemia and DLBCL. [name] was last seen by me on 01/25/23.  Today, [name] states that [name] is doing  well overall. [name] appetite level is at 75%. [name] energy level is at 75%.  [name] reports [name] had a root canal procedure yesterday and had a cyst that had to be removed as well.   [name] reports 2 mild sinus infections since [name] last visit that did not require prescribed antibiotics. [name] denies any fevers, night sweats, or unintentional weight loss.   [name] notes improved back  pain that comes and goes.   PAST MEDICAL HISTORY:   Past Medical History: Past Medical History:  Diagnosis Date   Allergic rhinitis    Anxiety    Asthma    B12 deficiency 01/18/2015   Cataract    Cavitary lung disease    Chronic sinusitis    COPD (chronic obstructive pulmonary disease) (HCC)    Depression    Diffuse large B-cell lymphoma (HCC)    DVT (deep venous thrombosis) (HCC) 09/25/2014   Essential hypertension    GERD (gastroesophageal reflux disease)    Hematuria 04/07/2016   History of pneumonia 01/2013   Hyperlipidemia    Hypogammaglobulinemia, acquired (HCC)    Iron  deficiency anemia 01/18/2015   Myocardial infarction (HCC) 2011   Narcolepsy without cataplexy(347.00) 01/18/2015   Orofacial dyskinesia 04/22/2015   Osteoporosis    Peripheral neuropathy due to chemotherapy (HCC) 05/02/2016   Sleep apnea    SVC syndrome 09/25/2014    Surgical History: Past Surgical History:  Procedure Laterality Date   ABDOMINAL HYSTERECTOMY     Fibroids   BASAL CELL CARCINOMA EXCISION  03/2011   scalp   BREAST SURGERY Right    biopsy   CATARACT EXTRACTION W/PHACO Left 11/17/2014   Procedure: CATARACT EXTRACTION PHACO AND INTRAOCULAR LENS PLACEMENT LEFT EYE;  Surgeon: Anner Kill, MD;  Location: [facility];  Service: Ophthalmology;  Laterality: Left;  CDE:5.60   CATARACT EXTRACTION W/PHACO Right 12/15/2014   Procedure: CATARACT EXTRACTION PHACO AND INTRAOCULAR LENS PLACEMENT RIGHT EYE CDE=5.16;  Surgeon: Anner Kill, MD;  Location: [facility];  Service: Ophthalmology;  Laterality: Right;   COLONOSCOPY N/A 11/14/2012   Procedure: COLONOSCOPY;  Surgeon: Ruby Corporal, MD;  Location: [facility];  Service: Endoscopy;  Laterality: N/A;  830   COLONOSCOPY WITH PROPOFOL  N/A 01/12/2018   Procedure: COLONOSCOPY WITH PROPOFOL ;  Surgeon: Ruby Corporal, MD;  Location: [facility];  Service: Endoscopy;  Laterality: N/A;  7:30   EYE SURGERY     NASAL SINUS SURGERY     NECK SURGERY     x 2     PERIPHERALLY INSERTED CENTRAL CATHETER INSERTION     PICC Removal     POLYPECTOMY  01/12/2018   Procedure: POLYPECTOMY;  Surgeon: Ruby Corporal, MD;  Location: [facility];  Service: Endoscopy;;  colon   PORT-A-CATH REMOVAL     PORTACATH PLACEMENT  7/11   Removed 6/12   TRACHEOSTOMY     VESICOVAGINAL FISTULA CLOSURE W/ TAH      Social History: Social History   Socioeconomic History   Marital status: Divorced    Spouse name: Not on file   Number of children: 3   Years of education: 12   Highest education level: Not on file  Occupational History   Occupation: teller    Comment: [employer]  Tobacco Use   Smoking status: Never   Smokeless tobacco: Never  Vaping Use   Vaping status: Never Used  Substance and Sexual Activity   Alcohol use: No   Drug use: No   Sexual activity: Not on file  Comment: hyst  Other Topics Concern   Not on file  Social History Narrative   Originally from [facility]. Always lived in [place]. Prior travel to [place] & [place]. Previously worked in a Physicist, medical as a Producer, television/film/video. Currently works as a Haematologist. [name] has 1 dog currently. [name] has 1 conures (small parrots). No mold exposure in [name] home. 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 No hot tub exposure. [name] enjoys sewing & quilting.    Social Drivers of Health   Financial Resource Strain: Medium Risk (12/03/2019)   Overall Financial Resource Strain (CARDIA)    Difficulty of Paying Living Expenses: Somewhat hard  Food Insecurity: No Food Insecurity (12/03/2019)   Hunger Vital Sign    Worried About Running Out of Food in the Last Year: Never true    Ran Out of Food in the Last Year: Never true  Transportation Needs: No Transportation Needs (12/03/2019)   PRAPARE - Administrator, Civil Service (Medical): No    Lack of Transportation (Non-Medical): No  Physical Activity: Insufficiently Active (12/03/2019)   Exercise Vital Sign    Days of Exercise per Week: 2 days     Minutes of Exercise per Session: 10 min  Stress: No Stress Concern Present (12/03/2019)   [employer] of Occupational Health - Occupational Stress Questionnaire    Feeling of Stress : Not at all  Social Connections: Moderately Integrated (12/03/2019)   Social Connection and Isolation Panel [NHANES]    Frequency of Communication with Friends and Family: More than three times a week    Frequency of Social Gatherings with Friends and Family: More than three times a week    Attends Religious Services: More than 4 times per year    Active Member of [employer] or Organizations: Yes    Attends Engineer, structural: More than 4 times per year    Marital Status: Divorced  Intimate Partner Violence: Not At Risk (12/03/2019)   Humiliation, Afraid, Rape, and Kick questionnaire    Fear of Current or Ex-Partner: No    Emotionally Abused: No    Physically Abused: No    Sexually Abused: No    Family History: Family History  Problem Relation Age of Onset   Emphysema Mother    Stroke Mother    COPD Mother    Heart disease Mother        died in sleep  77   Allergies Father    Asthma Father        as a child   Arthritis Father    Parkinson's disease Father 93   Leukemia Maternal Grandmother    Cancer Maternal Grandmother        Leukemia   Diabetes Brother    Heart attack Brother    Hypertension Brother    Heart disease Brother 40       stents   Hypertension Sister    Hypertension Brother     Current Medications:  Current Outpatient Medications:    acetaminophen  (TYLENOL ) 500 MG tablet, Take 1,000 mg by mouth every 6 (six) hours as needed for moderate pain or headache. , Disp: , Rfl:    acyclovir  (ZOVIRAX ) 400 MG tablet, TAKE ONE TABLET BY MOUTH TWICE DAILY., Disp: 60 tablet, Rfl: 6   albuterol  (PROAIR  HFA) 108 (90 Base) MCG/ACT inhaler, Inhale 2 puffs into the lungs every 6 (six) hours as needed. (Patient taking differently: Inhale 2 puffs into the lungs every 6 (six)  hours as  needed for wheezing or shortness of breath.), Disp: 8.5 g, Rfl: 2   albuterol  (PROVENTIL ) (2.5 MG/3ML) 0.083% nebulizer solution, Take 2.5 mg by nebulization every 4 (four) hours as needed for wheezing or shortness of breath., Disp: , Rfl:    amoxicillin  (AMOXIL ) 500 MG capsule, Take 500 mg by mouth 2 (two) times daily., Disp: , Rfl:    amphetamine-dextroamphetamine (ADDERALL) 10 MG tablet, Take 10 mg by mouth every morning. , Disp: , Rfl: 0   Armodafinil 150 MG tablet, Take 150 mg by mouth every morning. , Disp: , Rfl:    aspirin EC 81 MG tablet, Take 81 mg by mouth daily. Swallow whole., Disp: , Rfl:    budesonide -formoterol  (SYMBICORT ) 160-4.5 MCG/ACT inhaler, Inhale 2 puffs into the lungs 2 (two) times daily., Disp: 1 Inhaler, Rfl: 5   celecoxib (CELEBREX) 200 MG capsule, Take 200 mg by mouth 2 (two) times daily as needed., Disp: , Rfl:    chlorhexidine  (PERIDEX ) 0.12 % solution, SMARTSIG:0.5 Ounce(s) Morning-Night, Disp: , Rfl:    Cholecalciferol (VITAMIN D3 PO), Take 1 capsule by mouth at bedtime., Disp: , Rfl:    citalopram  (CELEXA ) 10 MG tablet, Take 10 mg by mouth daily., Disp: , Rfl:    cyanocobalamin  1000 MCG tablet, Take 1,000 mcg by mouth daily., Disp: , Rfl:    cyclobenzaprine (FLEXERIL) 5 MG tablet, Take 5 mg by mouth every 8 (eight) hours as needed., Disp: , Rfl:    gabapentin  (NEURONTIN ) 600 MG tablet, 300 mg 2 (two) times daily. , Disp: , Rfl:    olmesartan (BENICAR) 20 MG tablet, Take 20 mg by mouth daily., Disp: , Rfl:    omeprazole  (PRILOSEC) 40 MG capsule, Take 40 mg by mouth daily., Disp: , Rfl:    oxyCODONE-acetaminophen  (PERCOCET/ROXICET) 5-325 MG tablet, Take 1 tablet by mouth every 4 (four) hours as needed., Disp: , Rfl:    simvastatin  (ZOCOR ) 40 MG tablet, Take 40 mg by mouth daily., Disp: , Rfl:    zafirlukast  (ACCOLATE ) 20 MG tablet, TAKE 1 TABLET BY MOUTH TWICE DAILY., Disp: 60 tablet, Rfl: 0 No current facility-administered medications for this  visit.  Facility-Administered Medications Ordered in Other Visits:    0.9 %  sodium chloride  infusion, , Intravenous, Once, Kefalas, Thomas S, PA-C   0.9 %  sodium chloride  infusion, , Intravenous, Once, Kefalas, Thomas S, PA-C   acetaminophen  (TYLENOL ) tablet 650 mg, 650 mg, Oral, Q6H PRN, Kefalas, Thomas S, PA-C   acetaminophen  (TYLENOL ) tablet 650 mg, 650 mg, Oral, Q6H PRN, Kefalas, Thomas S, PA-C   dextrose  5 % solution, , Intravenous, Continuous, Kefalas, Linn Rich, PA-C, Stopped at 08/25/15 1400   dextrose  5 % solution, , Intravenous, Continuous, Kefalas, Linn Rich, PA-C, Stopped at 07/13/16 1350   dextrose  5 % solution, , Intravenous, Continuous, Alanna Alley, NP, Stopped at 02/28/17 1230   dextrose  5 % solution, , Intravenous, Continuous, Rulon Abdalla, MD, Stopped at 06/01/23 1142   diphenhydrAMINE  (BENADRYL ) capsule 25 mg, 25 mg, Oral, Once, Kefalas, Thomas S, PA-C   diphenhydrAMINE  (BENADRYL ) capsule 25 mg, 25 mg, Oral, Once, Kefalas, Thomas S, PA-C   sodium chloride  0.9 % injection 10 mL, 10 mL, Intracatheter, PRN, Kefalas, Thomas S, PA-C, 10 mL at 08/25/15 1125   Allergies: Allergies  Allergen Reactions   Meperidine  Hcl Anaphylaxis   Montelukast Sodium Hives and Rash    REVIEW OF SYSTEMS:   Review of Systems  Constitutional:  Negative for chills, fatigue and fever.  HENT:  Positive for trouble swallowing. Negative for lump/mass, mouth sores, nosebleeds and sore throat.   Eyes:  Negative for eye problems.  Respiratory:  Negative for cough and shortness of breath.   Cardiovascular:  Negative for chest pain, leg swelling and palpitations.  Gastrointestinal:  Negative for abdominal pain, constipation, diarrhea, nausea and vomiting.  Genitourinary:  Negative for bladder incontinence, difficulty urinating, dysuria, frequency, hematuria and nocturia.   Musculoskeletal:  Negative for arthralgias, back pain, flank pain, myalgias and neck pain.  Skin:  Negative for  itching and rash.  Neurological:  Negative for dizziness, headaches and numbness.  Hematological:  Does not bruise/bleed easily.  Psychiatric/Behavioral:  Negative for depression, sleep disturbance and suicidal ideas. The patient is not nervous/anxious.   All other systems reviewed and are negative.    VITALS:   Blood pressure 133/70, pulse 78, temperature (!) 96.3 F (35.7 C), temperature source Tympanic, resp. rate 18, weight 124 lb 1.9 oz (56.3 kg), SpO2 97%.  Wt Readings from Last 3 Encounters:  06/01/23 124 lb 1.9 oz (56.3 kg)  04/06/23 125 lb 3.2 oz (56.8 kg)  01/25/23 124 lb 5.4 oz (56.4 kg)    Body mass index is 23.45 kg/m.  Performance status (ECOG): 1 - Symptomatic but completely ambulatory  PHYSICAL EXAM:   Physical Exam Vitals and nursing note reviewed. Exam conducted with a chaperone present.  Constitutional:      Appearance: Normal appearance.  Cardiovascular:     Rate and Rhythm: Normal rate and regular rhythm.     Pulses: Normal pulses.     Heart sounds: Normal heart sounds.  Pulmonary:     Effort: Pulmonary effort is normal.     Breath sounds: Normal breath sounds.  Abdominal:     Palpations: Abdomen is soft. There is no hepatomegaly, splenomegaly or mass.     Tenderness: There is no abdominal tenderness.  Musculoskeletal:     Right lower leg: No edema.     Left lower leg: No edema.  Lymphadenopathy:     Cervical: No cervical adenopathy.     Right cervical: No superficial, deep or posterior cervical adenopathy.    Left cervical: No superficial, deep or posterior cervical adenopathy.     Upper Body:     Right upper body: No supraclavicular or axillary adenopathy.     Left upper body: No supraclavicular or axillary adenopathy.  Neurological:     General: No focal deficit present.     Mental Status: [name] is alert and oriented to person, place, and time.  Psychiatric:        Mood and Affect: Mood normal.        Behavior: Behavior normal.     LABS:    CBC     Component Value Date/Time   WBC 8.2 05/25/2023 0845   RBC 3.86 (L) 05/25/2023 0845   HGB 12.4 05/25/2023 0845   HCT 38.6 05/25/2023 0845   PLT 186 05/25/2023 0845   MCV 100.0 05/25/2023 0845   MCH 32.1 05/25/2023 0845   MCHC 32.1 05/25/2023 0845   RDW 12.5 05/25/2023 0845   LYMPHSABS 1.8 05/25/2023 0845   MONOABS 0.7 05/25/2023 0845   EOSABS 0.2 05/25/2023 0845   BASOSABS 0.1 05/25/2023 0845    CMP      Component Value Date/Time   NA 137 05/25/2023 0845   K 3.8 05/25/2023 0845   CL 101 05/25/2023 0845   CO2 29 05/25/2023 0845   GLUCOSE 77 05/25/2023 0845   BUN 16 05/25/2023 0845  CREATININE 0.96 05/25/2023 0845   CALCIUM  8.9 05/25/2023 0845   PROT 7.3 05/25/2023 0845   ALBUMIN 3.4 (L) 05/25/2023 0845   AST 19 05/25/2023 0845   ALT 11 05/25/2023 0845   ALKPHOS 103 05/25/2023 0845   BILITOT 0.6 05/25/2023 0845   GFRNONAA >60 05/25/2023 0845   GFRAA >60 10/07/2019 1009     No results found for: "CEA1", "CEA" / No results found for: "CEA1", "CEA" No results found for: "PSA1" No results found for: "CAN199" No results found for: "CAN125"  Lab Results  Component Value Date   TOTALPROTELP 6.3 02/11/2014   TOTALPROTELP 6.3 02/11/2014   ALBUMINELP 57.6 02/11/2014   A1GS 6.1 (H) 02/11/2014   A2GS 11.7 02/11/2014   BETS 6.4 02/11/2014   BETA2SER 5.3 02/11/2014   GAMS 12.9 02/11/2014   MSPIKE NOT DETECTED 02/11/2014   SPEI (NOTE) 02/11/2014   Lab Results  Component Value Date   TIBC 202 (L) 05/25/2023   TIBC 209 (L) 04/06/2023   TIBC 154 (L) 01/17/2023   FERRITIN 171 05/25/2023   FERRITIN 301 04/06/2023   FERRITIN 436 (H) 01/17/2023   IRONPCTSAT 30 05/25/2023   IRONPCTSAT 12 04/06/2023   IRONPCTSAT 38 (H) 01/17/2023   Lab Results  Component Value Date   LDH 146 05/25/2023   LDH 116 01/17/2023   LDH 162 11/29/2022     STUDIES:   No results found.

## 2023-06-01 ENCOUNTER — Inpatient Hospital Stay

## 2023-06-01 ENCOUNTER — Inpatient Hospital Stay (HOSPITAL_BASED_OUTPATIENT_CLINIC_OR_DEPARTMENT_OTHER): Admitting: Hematology

## 2023-06-01 VITALS — BP 133/70 | HR 78 | Temp 96.3°F | Resp 18 | Wt 124.1 lb

## 2023-06-01 VITALS — BP 123/73 | HR 78 | Temp 97.2°F | Resp 18

## 2023-06-01 DIAGNOSIS — C8338 Diffuse large B-cell lymphoma, lymph nodes of multiple sites: Secondary | ICD-10-CM | POA: Diagnosis not present

## 2023-06-01 DIAGNOSIS — D801 Nonfamilial hypogammaglobulinemia: Secondary | ICD-10-CM | POA: Diagnosis not present

## 2023-06-01 DIAGNOSIS — D508 Other iron deficiency anemias: Secondary | ICD-10-CM

## 2023-06-01 DIAGNOSIS — C83398 Diffuse large b-cell lymphoma of other extranodal and solid organ sites: Secondary | ICD-10-CM | POA: Diagnosis not present

## 2023-06-01 DIAGNOSIS — D509 Iron deficiency anemia, unspecified: Secondary | ICD-10-CM | POA: Diagnosis not present

## 2023-06-01 MED ORDER — IMMUNE GLOBULIN (HUMAN) 10 GM/100ML IV SOLN
30.0000 g | Freq: Once | INTRAVENOUS | Status: AC
  Administered 2023-06-01: 30 g via INTRAVENOUS
  Filled 2023-06-01: qty 300

## 2023-06-01 MED ORDER — DEXTROSE 5 % IV SOLN: INTRAVENOUS | Status: DC

## 2023-06-01 NOTE — Progress Notes (Signed)
 Privigen  5 gm vials not available - today's dose Privigen  30 gm  Ok per MD  T.O. Dr Davina Ester, PharmD

## 2023-06-01 NOTE — Patient Instructions (Signed)
 [facility] [place] - A DEPT OF [facility]  Discharge Instructions: Thank you for choosing [facility] to provide your oncology and hematology care.  If you have a lab appointment with the Cancer Center - please note that after April 8th, 2024, all labs will be drawn in the cancer center.  You do not have to check in or register with the main entrance as you have in the past but will complete your check-in in the cancer center.  Wear comfortable clothing and clothing appropriate for easy access to any Portacath or PICC line.   We strive to give you quality time with your provider. You may need to reschedule your appointment if you arrive late (15 or more minutes).  Arriving late affects you and other patients whose appointments are after yours.  Also, if you miss three or more appointments without notifying the office, you may be dismissed from the clinic at the provider's discretion.      For prescription refill requests, have your pharmacy contact our office and allow 72 hours for refills to be completed.    Today you received the following:  Privigen.  Immune Globulin Injection What is this medication? IMMUNE GLOBULIN (im MUNE GLOB yoo lin) treats many immune system conditions. It works by Designer, multimedia extra antibodies. Antibodies are proteins made by the immune system that help protect the body. This medicine may be used for other purposes; ask your health care provider or pharmacist if you have questions. COMMON BRAND NAME(S): ASCENIV, Baygam, BIVIGAM, Carimune, Carimune NF, cutaquig, Cuvitru, Flebogamma, Flebogamma DIF, GamaSTAN, GamaSTAN S/D, Gamimune N, Gammagard, Gammagard S/D, Gammaked, Gammaplex, Gammar-P IV, Gamunex, Gamunex-C, Hizentra, Iveegam, Iveegam EN, Octagam, Panglobulin, Panglobulin NF, panzyga, Polygam S/D, Privigen, Sandoglobulin, Venoglobulin-S, Vigam, Vivaglobulin, Xembify What should I tell my care team before I take this  medication? They need to know if you have any of these conditions: Blood clotting disorder Condition where you have excess fluid in your body, such as heart failure or edema Dehydration Diabetes Have had blood clots Heart disease Immune system conditions Kidney disease Low levels of IgA Recent or upcoming vaccine An unusual or allergic reaction to immune globulin, other medications, foods, dyes, or preservatives Pregnant or trying to get pregnant Breastfeeding How should I use this medication? This medication is infused into a vein or under the skin. It is usually given by your care team in a hospital or clinic setting. It may also be given at home. If you get this medication at home, you will be taught how to prepare and give it. Use exactly as directed. Take it as directed on the prescription label at the same time every day. Keep taking it unless your care team tells you to stop. It is important that you put your used needles and syringes in a special sharps container. Do not put them in a trash can. If you do not have a sharps container, call your pharmacist or care team to get one. Talk to your care team about the use of this medication in children. While it may be given to children for selected conditions, precautions do apply. Overdosage: If you think you have taken too much of this medicine contact a poison control center or emergency room at once. NOTE: This medicine is only for you. Do not share this medicine with others. What if I miss a dose? If you get this medication at the hospital or clinic: It is important not to  miss your dose. Call your care team if you are unable to keep an appointment. If you give yourself this medication at home: If you miss a dose, take it as soon as you can. Then continue your normal schedule. If it is almost time for your next dose, take only that dose. Do not take double or extra doses. Call your care team with questions. What may interact with this  medication? Live virus vaccines This list may not describe all possible interactions. Give your health care provider a list of all the medicines, herbs, non-prescription drugs, or dietary supplements you use. Also tell them if you smoke, drink alcohol, or use illegal drugs. Some items may interact with your medicine. What should I watch for while using this medication? Your condition will be monitored carefully while you are receiving this medication. Tell your care team if your symptoms do not start to get better or if they get worse. You may need blood work done while you are taking this medication. This medication increases the risk of blood clots. People with heart, blood vessel, or blood clotting conditions are more likely to develop a blood clot. Other risk factors include advanced age, estrogen use, tobacco use, lack of movement, and being overweight. This medication can decrease the response to a vaccine. If you need to get vaccinated, tell your care team if you have received this medication within the last year. Extra booster doses may be needed. Talk to your care team to see if a different vaccination schedule is needed. If you have diabetes, you may get a falsely elevated blood sugar reading. Talk to your care team about how to check your blood sugar while taking this medication. What side effects may I notice from receiving this medication? Side effects that you should report to your care team as soon as possible: Allergic reactions--skin rash, itching, hives, swelling of the face, lips, tongue, or throat Blood clot--pain, swelling, or warmth in the leg, shortness of breath, chest pain Fever, neck pain or stiffness, sensitivity to light, headache, nausea, vomiting, confusion, which may be signs of meningitis Hemolytic anemia--unusual weakness or fatigue, dizziness, headache, trouble breathing, dark urine, yellowing skin or eyes Kidney injury--decrease in the amount of urine, swelling of  the ankles, hands, or feet Low sodium level--muscle weakness, fatigue, dizziness, headache, confusion Shortness of breath or trouble breathing, cough, unusual weakness or fatigue, blue skin or lips Side effects that usually do not require medical attention (report these to your care team if they continue or are bothersome): Chills Diarrhea Fever Headache Nausea This list may not describe all possible side effects. Call your doctor for medical advice about side effects. You may report side effects to FDA at 1-800-FDA-1088. Where should I keep my medication? Keep out of the reach of children and pets. You will be instructed on how to store this medication. Get rid of any unused medication after the expiration date. To get rid of medications that are no longer needed or have expired: Take the medication to a medication take-back program. Check with your pharmacy or law enforcement to find a location. If you cannot return the medication, ask your pharmacist or care team how to get rid of this medication safely. NOTE: This sheet is a summary. It may not cover all possible information. If you have questions about this medicine, talk to your doctor, pharmacist, or health care provider.  2024 Elsevier/Gold Standard (2022-12-02 00:00:00)    To help prevent nausea and vomiting after your treatment,  we encourage you to take your nausea medication as directed.  BELOW ARE SYMPTOMS THAT SHOULD BE REPORTED IMMEDIATELY: *FEVER GREATER THAN 100.4 F (38 C) OR HIGHER *CHILLS OR SWEATING *NAUSEA AND VOMITING THAT IS NOT CONTROLLED WITH YOUR NAUSEA MEDICATION *UNUSUAL SHORTNESS OF BREATH *UNUSUAL BRUISING OR BLEEDING *URINARY PROBLEMS (pain or burning when urinating, or frequent urination) *BOWEL PROBLEMS (unusual diarrhea, constipation, pain near the anus) TENDERNESS IN MOUTH AND THROAT WITH OR WITHOUT PRESENCE OF ULCERS (sore throat, sores in mouth, or a toothache) UNUSUAL RASH, SWELLING OR PAIN   UNUSUAL VAGINAL DISCHARGE OR ITCHING   Items with * indicate a potential emergency and should be followed up as soon as possible or go to the Emergency Department if any problems should occur.  Please show the CHEMOTHERAPY ALERT CARD or IMMUNOTHERAPY ALERT CARD at check-in to the Emergency Department and triage nurse.  Should you have questions after your visit or need to cancel or reschedule your appointment, please contact [facility] [place] - A DEPT OF [facility] [phone]  and follow the prompts.  Office hours are 8:00 a.m. to 4:30 p.m. Monday - Friday. Please note that voicemails left after 4:00 p.m. may not be returned until the following business day.  We are closed weekends and major holidays. You have access to a nurse at all times for urgent questions. Please call the main number to the clinic [phone] and follow the prompts.  For any non-urgent questions, you may also contact your provider using MyChart. We now offer e-Visits for anyone 66 and older to request care online for non-urgent symptoms. For details visit [email or web address].   Also download the MyChart app! Go to the app store, search "MyChart", open the app, select [place], and log in with your MyChart username and password.

## 2023-06-01 NOTE — Progress Notes (Signed)
 Patient presents today for IVIG infusion.  Patient is in satisfactory condition with no new complaints voiced.  Vital signs are stable.  IV placed in L arm.  IV flushed well with good blood return noted.  Tylenol  and Benadryl  taken at 0630 prior to visit.  We will proceed with infusion per provider orders.    Patient tolerated IVIG well with no complaints voiced.  Patient left ambulatory in stable condition.  Vital signs stable at discharge.  Follow up as scheduled.

## 2023-06-01 NOTE — Patient Instructions (Signed)
 [facility] at [facility] Discharge Instructions   You were seen and examined today by Dr. Cheree Cords.  He reviewed the results of your lab work which are normal/stable.   We will proceed with your treatment today. We will change your treatment to every 12 weeks since your immunoglobulin levels are high.   Return as scheduled.    Thank you for choosing [facility] at [facility] to provide your oncology and hematology care.  To afford each patient quality time with our provider, please arrive at least 15 minutes before your scheduled appointment time.   If you have a lab appointment with the Cancer Center please come in thru the Main Entrance and check in at the main information desk.  You need to re-schedule your appointment should you arrive 10 or more minutes late.  We strive to give you quality time with our providers, and arriving late affects you and other patients whose appointments are after yours.  Also, if you no show three or more times for appointments you may be dismissed from the clinic at the providers discretion.     Again, thank you for choosing [facility].  Our hope is that these requests will decrease the amount of time that you wait before being seen by our physicians.       _____________________________________________________________  Should you have questions after your visit to [facility], please contact our office at [phone] and follow the prompts.  Our office hours are 8:00 a.m. and 4:30 p.m. Monday - Friday.  Please note that voicemails left after 4:00 p.m. may not be returned until the following business day.  We are closed weekends and major holidays.  You do have access to a nurse 24-7, just call the main number to the clinic [phone] and do not press any options, hold on the line and a nurse will answer the phone.    For prescription refill requests, have your pharmacy contact our  office and allow 72 hours.    Due to Covid, you will need to wear a mask upon entering the hospital. If you do not have a mask, a mask will be given to you at the Main Entrance upon arrival. For doctor visits, patients may have 1 support person age 16 or older with them. For treatment visits, patients can not have anyone with them due to social distancing guidelines and our immunocompromised population.

## 2023-06-04 NOTE — Progress Notes (Deleted)
 [name], female    DOB: 1955-11-12    MRN: [id number]   Brief patient profile:  ***  yo*** *** former [facility] self-referred back to pulmonary clinic in [place]  06/08/2023  for ***    Never smoker, history of B-cell lymphoma status post treatment, now hypogammaglobulinemia and IgG deficiency, OSA/narcolepsy, DVT with SVC syndrome, chronic sinus disease.  Has been followed by Dr. Nestor for chronic cough, mild  persistent asthma.     History of Present Illness  06/08/2023  Pulmonary/ 1st office eval/ [facility] Lowe / [place] [facility]  No chief complaint on file.    Dyspnea:  *** Cough: *** Sleep: *** SABA use: *** 02 use:*** LDSCT:***  No obvious day to day or daytime pattern/variability or assoc excess/ purulent sputum or mucus plugs or hemoptysis or cp or chest tightness, subjective wheeze or overt sinus or hb symptoms.    Also denies any obvious fluctuation of symptoms with weather or environmental changes or other aggravating or alleviating factors except as outlined above   No unusual exposure hx or h/o childhood pna/ asthma or knowledge of premature birth.  Current Allergies, Complete Past Medical History, Past Surgical History, Family History, and Social History were reviewed in [employer] record.  ROS  The following are not active complaints unless bolded Hoarseness, sore throat, dysphagia, dental problems, itching, sneezing,  nasal congestion or discharge of excess mucus or purulent secretions, ear ache,   fever, chills, sweats, unintended wt loss or wt gain, classically pleuritic or exertional cp,  orthopnea pnd or arm/hand swelling  or leg swelling, presyncope, palpitations, abdominal pain, anorexia, nausea, vomiting, diarrhea  or change in bowel habits or change in bladder habits, change in stools or change in urine, dysuria, hematuria,  rash, arthralgias, visual complaints, headache, numbness, weakness or ataxia or problems with walking or  coordination,  change in mood or  memory.            Outpatient Medications Prior to Visit  Medication Sig Dispense Refill   acetaminophen  (TYLENOL ) 500 MG tablet Take 1,000 mg by mouth every 6 (six) hours as needed for moderate pain or headache.      acyclovir  (ZOVIRAX ) 400 MG tablet TAKE ONE TABLET BY MOUTH TWICE DAILY. 60 tablet 6   albuterol  (PROAIR  HFA) 108 (90 Base) MCG/ACT inhaler Inhale 2 puffs into the lungs every 6 (six) hours as needed. (Patient taking differently: Inhale 2 puffs into the lungs every 6 (six) hours as needed for wheezing or shortness of breath.) 8.5 g 2   albuterol  (PROVENTIL ) (2.5 MG/3ML) 0.083% nebulizer solution Take 2.5 mg by nebulization every 4 (four) hours as needed for wheezing or shortness of breath.     amoxicillin  (AMOXIL ) 500 MG capsule Take 500 mg by mouth 2 (two) times daily.     amphetamine-dextroamphetamine (ADDERALL) 10 MG tablet Take 10 mg by mouth every morning.   0   Armodafinil 150 MG tablet Take 150 mg by mouth every morning.      aspirin EC 81 MG tablet Take 81 mg by mouth daily. Swallow whole.     budesonide -formoterol  (SYMBICORT ) 160-4.5 MCG/ACT inhaler Inhale 2 puffs into the lungs 2 (two) times daily. 1 Inhaler 5   celecoxib (CELEBREX) 200 MG capsule Take 200 mg by mouth 2 (two) times daily as needed.     chlorhexidine  (PERIDEX ) 0.12 % solution SMARTSIG:0.5 Ounce(s) Morning-Night     Cholecalciferol (VITAMIN D3 PO) Take 1 capsule by mouth at bedtime.  citalopram  (CELEXA ) 10 MG tablet Take 10 mg by mouth daily.     cyanocobalamin  1000 MCG tablet Take 1,000 mcg by mouth daily.     cyclobenzaprine (FLEXERIL) 5 MG tablet Take 5 mg by mouth every 8 (eight) hours as needed.     gabapentin  (NEURONTIN ) 600 MG tablet 300 mg 2 (two) times daily.      olmesartan (BENICAR) 20 MG tablet Take 20 mg by mouth daily.     omeprazole  (PRILOSEC) 40 MG capsule Take 40 mg by mouth daily.     oxyCODONE-acetaminophen  (PERCOCET/ROXICET) 5-325 MG tablet Take 1  tablet by mouth every 4 (four) hours as needed.     simvastatin  (ZOCOR ) 40 MG tablet Take 40 mg by mouth daily.     zafirlukast  (ACCOLATE ) 20 MG tablet TAKE 1 TABLET BY MOUTH TWICE DAILY. 60 tablet 0   Facility-Administered Medications Prior to Visit  Medication Dose Route Frequency Provider Last Rate Last Admin   0.9 %  sodium chloride  infusion   Intravenous Once Kefalas, Thomas S, PA-C       0.9 %  sodium chloride  infusion   Intravenous Once Kefalas, Thomas S, PA-C       acetaminophen  (TYLENOL ) tablet 650 mg  650 mg Oral Q6H PRN Kefalas, Thomas S, PA-C       acetaminophen  (TYLENOL ) tablet 650 mg  650 mg Oral Q6H PRN Kefalas, Thomas S, PA-C       dextrose  5 % solution   Intravenous Continuous Kefalas, Linn Rich, PA-C   Stopped at 08/25/15 1400   dextrose  5 % solution   Intravenous Continuous Kefalas, Linn Rich, PA-C   Stopped at 07/13/16 1350   dextrose  5 % solution   Intravenous Continuous Alanna Alley, NP   Stopped at 02/28/17 1230   diphenhydrAMINE  (BENADRYL ) capsule 25 mg  25 mg Oral Once Kefalas, Thomas S, PA-C       diphenhydrAMINE  (BENADRYL ) capsule 25 mg  25 mg Oral Once Kefalas, Thomas S, PA-C       sodium chloride  0.9 % injection 10 mL  10 mL Intracatheter PRN Doretta Gant, PA-C   10 mL at 08/25/15 1125    Past Medical History:  Diagnosis Date   Allergic rhinitis    Anxiety    Asthma    B12 deficiency 01/18/2015   Cataract    Cavitary lung disease    Chronic sinusitis    COPD (chronic obstructive pulmonary disease) (HCC)    Depression    Diffuse large B-cell lymphoma (HCC)    DVT (deep venous thrombosis) (HCC) 09/25/2014   Essential hypertension    GERD (gastroesophageal reflux disease)    Hematuria 04/07/2016   History of pneumonia 01/2013   Hyperlipidemia    Hypogammaglobulinemia, acquired (HCC)    Iron  deficiency anemia 01/18/2015   Myocardial infarction (HCC) 2011   Narcolepsy without cataplexy(347.00) 01/18/2015   Orofacial dyskinesia 04/22/2015    Osteoporosis    Peripheral neuropathy due to chemotherapy (HCC) 05/02/2016   Sleep apnea    SVC syndrome 09/25/2014      Objective:     There were no vitals taken for this visit.         Assessment   No problem-specific Assessment & Plan notes found for this encounter.     Vernestine Gondola, MD 06/04/2023

## 2023-06-08 ENCOUNTER — Ambulatory Visit: Admitting: Internal Medicine

## 2023-07-12 ENCOUNTER — Encounter: Payer: Self-pay | Admitting: Cardiology

## 2023-07-12 ENCOUNTER — Ambulatory Visit: Attending: Cardiology | Admitting: Cardiology

## 2023-07-12 VITALS — BP 98/70 | HR 70 | Ht 60.0 in | Wt 122.6 lb

## 2023-07-12 DIAGNOSIS — I350 Nonrheumatic aortic (valve) stenosis: Secondary | ICD-10-CM

## 2023-07-12 DIAGNOSIS — I491 Atrial premature depolarization: Secondary | ICD-10-CM | POA: Diagnosis not present

## 2023-07-12 DIAGNOSIS — E782 Mixed hyperlipidemia: Secondary | ICD-10-CM | POA: Diagnosis not present

## 2023-07-12 DIAGNOSIS — I10 Essential (primary) hypertension: Secondary | ICD-10-CM | POA: Diagnosis not present

## 2023-07-12 NOTE — Patient Instructions (Signed)
 Medication Instructions:  Your physician recommends that you continue on your current medications as directed. Please refer to the Current Medication list given to you today.   Labwork: None today  Testing/Procedures: None today  Follow-Up: 1 year  Any Other Special Instructions Will Be Listed Below (If Applicable).  If you need a refill on your cardiac medications before your next appointment, please call your pharmacy.

## 2023-07-12 NOTE — Progress Notes (Signed)
    Cardiology Office Note  Date: 07/12/2023   ID: COCO SHARPNACK, DOB [birth year], MRN [id number]  History of Present Illness: [name] is a 68 y.o. female last seen in November 2023 by Ms. Strader PA-C, I reviewed her note (our last encounter was in 2022).  She is here for a follow-up visit.  She reports no exertional chest pain or breathlessness beyond NYHA class II, no dizziness or syncope.  She continues to follow in the hematology/oncology clinic, I reviewed the last note from May.  We went over her medications.  She continues on antihypertensive and lipid-lowering therapy.  Still follows with Dr. Marvine for primary care.  I reviewed her ECG today which shows sinus rhythm with occasional PACs, incomplete right bundle-branch block.  I reviewed her interval echocardiogram from May 2024 as described below.  Physical Exam: VS:  BP 98/70 (BP Location: Left Arm, Patient Position: Sitting, Cuff Size: Normal)   Pulse 70   Ht 5' (1.524 m)   Wt 122 lb 9.6 oz (55.6 kg)   SpO2 92%   BMI 23.94 kg/m , BMI Body mass index is 23.94 kg/m.  Wt Readings from Last 3 Encounters:  07/12/23 122 lb 9.6 oz (55.6 kg)  06/01/23 124 lb 1.9 oz (56.3 kg)  04/06/23 125 lb 3.2 oz (56.8 kg)    General: Patient appears comfortable at rest. HEENT: Conjunctiva and lids normal. Neck: Supple, no elevated JVP or carotid bruits. Lungs: Clear to auscultation, nonlabored breathing at rest. Cardiac: Regular rate and rhythm, no S3, 2/6 systolic murmur. Extremities: No pitting edema.  ECG:  An ECG dated 11/19/2021 was personally reviewed today and demonstrated:  Sinus rhythm with occasional PACs, right bundle branch block.  Labwork: November 2023: Cholesterol 158, triglycerides 105, HDL 62, LDL 77 05/25/2023: ALT 11; AST 19; BUN 16; Creatinine, Ser 0.96; Hemoglobin 12.4; Magnesium 1.8; Platelets 186; Potassium 3.8; Sodium 137   Other Studies Reviewed Today:  No interval cardiac testing for review  today.  Assessment and Plan:  1.  Mild aortic stenosis with mean AV gradient 11 mmHg and also associated mild aortic regurgitation by follow-up echocardiogram in May 2024.  She remains asymptomatic, no substantial change in cardiac murmur.  Anticipate follow-up echocardiogram in the next 2 years.  2.  Primary hypertension.  Blood pressure low normal today, she is asymptomatic.  Continue to track measurements with PCP.  Currently on Benicar 20 mg daily.  3.  Mixed hyperlipidemia.  Continues on Zocor  40 mg daily with follow-up by Dr. Marvine.  4.  PACs documented by interval ECGs, asymptomatic.  Continue observation.  Disposition:  Follow up 1 year.  Signed, Jayson JUDITHANN Sierras, M.D., F.A.C.C. [facility] at [facility]

## 2023-07-18 DIAGNOSIS — Z6821 Body mass index (BMI) 21.0-21.9, adult: Secondary | ICD-10-CM | POA: Diagnosis not present

## 2023-07-18 DIAGNOSIS — F909 Attention-deficit hyperactivity disorder, unspecified type: Secondary | ICD-10-CM | POA: Diagnosis not present

## 2023-07-30 NOTE — Progress Notes (Unsigned)
 [name], female    DOB: August 10, 1955    MRN: [id number]   Brief patient profile:  85  yowf  never smoker remote pt of Dr Brien referred to pulmonary clinic in [place]  07/31/2023 by Dr Bertell  for asthma re-eval with onset 2007 while working at [employer] with chronic sinusitis s/p [place] surgery prior to 2010   NH lymphoma chemo but no RT last rx 2012 > f/u immunoglobulin shots per K q 2 months   Pt not  seen by PCCM service since 2020 [place] dx Asthma   History of Present Illness  07/31/2023  Pulmonary/ 1st office eval/ [name] / [place] [facility]  Chief Complaint  Patient presents with   Establish Care  Dyspnea:  100 ft to garden some of it uphill stops half way most times/ walking at [employer] faster than other  Cough: sporadic/ no am or noct flare> minimal yellowish assoc with chronic sinusitis s/p 2 sinus surgeries last in 2011  Sleep: bed is flat is wedge pillow  SABA use: rare HFA / only does Neb during flares few and far between 02: none     No obvious day to day or daytime pattern/variability or assoc e  mucus plugs or hemoptysis or cp or chest tightness, subjective wheeze or overt   hb symptoms.    Also denies any obvious fluctuation of symptoms with weather or environmental changes or other aggravating or alleviating factors except as outlined above   No unusual exposure hx or h/o childhood pna/ asthma or knowledge of premature birth.  Current Allergies, Complete Past Medical History, Past Surgical History, Family History, and Social History were reviewed in [employer] record.  ROS  The following are not active complaints unless bolded Hoarseness, sore throat, dysphagia, dental problems, itching, sneezing,  nasal congestion or discharge of excess mucus or purulent secretions, ear ache,   fever, chills, sweats, unintended wt loss or wt gain, classically pleuritic or exertional cp,  orthopnea pnd or arm/hand swelling  or leg swelling,  presyncope, palpitations, abdominal pain, anorexia, nausea, vomiting, diarrhea  or change in bowel habits or change in bladder habits, change in stools or change in urine, dysuria, hematuria,  rash, arthralgias, visual complaints, headache, numbness, weakness or ataxia or problems with walking or coordination,  change in mood or  memory.            Outpatient Medications Prior to Visit  Medication Sig Dispense Refill   acetaminophen  (TYLENOL ) 500 MG tablet Take 1,000 mg by mouth every 6 (six) hours as needed for moderate pain or headache.      acyclovir  (ZOVIRAX ) 400 MG tablet TAKE ONE TABLET BY MOUTH TWICE DAILY. 60 tablet 6   albuterol  (PROAIR  HFA) 108 (90 Base) MCG/ACT inhaler Inhale 2 puffs into the lungs every 6 (six) hours as needed. 8.5 g 2   albuterol  (PROVENTIL ) (2.5 MG/3ML) 0.083% nebulizer solution Take 2.5 mg by nebulization every 4 (four) hours as needed for wheezing or shortness of breath.     amphetamine-dextroamphetamine (ADDERALL) 10 MG tablet Take 10 mg by mouth every morning.   0   Armodafinil 150 MG tablet Take 150 mg by mouth every morning.      aspirin EC 81 MG tablet Take 81 mg by mouth daily. Swallow whole.     celecoxib (CELEBREX) 200 MG capsule Take 200 mg by mouth 2 (two) times daily as needed.     citalopram  (CELEXA ) 10 MG tablet Take 10 mg by  mouth daily.     cyanocobalamin  1000 MCG tablet Take 1,000 mcg by mouth daily.     gabapentin  (NEURONTIN ) 600 MG tablet 300 mg 2 (two) times daily.      olmesartan (BENICAR) 20 MG tablet Take 20 mg by mouth daily.     omeprazole  (PRILOSEC) 40 MG capsule Take 40 mg by mouth daily.     simvastatin  (ZOCOR ) 40 MG tablet Take 40 mg by mouth daily.     zafirlukast  (ACCOLATE ) 20 MG tablet TAKE 1 TABLET BY MOUTH TWICE DAILY. 60 tablet 0   budesonide -formoterol  (SYMBICORT ) 160-4.5 MCG/ACT inhaler Inhale 2 puffs into the lungs 2 (two) times daily. 1 Inhaler 5   Facility-Administered Medications Prior to Visit  Medication Dose Route  Frequency Provider Last Rate Last Admin   0.9 %  sodium chloride  infusion   Intravenous Once Kefalas, Thomas S, PA-C       0.9 %  sodium chloride  infusion   Intravenous Once Kefalas, Thomas S, PA-C       acetaminophen  (TYLENOL ) tablet 650 mg  650 mg Oral Q6H PRN Berry Debby RAMAN, PA-C       acetaminophen  (TYLENOL ) tablet 650 mg  650 mg Oral Q6H PRN Berry Debby RAMAN, PA-C       dextrose  5 % solution   Intravenous Continuous Kefalas, Thomas S, PA-C   Stopped at 08/25/15 1400   dextrose  5 % solution   Intravenous Continuous Kefalas, Debby RAMAN, PA-C   Stopped at 07/13/16 1350   dextrose  5 % solution   Intravenous Continuous Letha Truman ORN, NP   Stopped at 02/28/17 1230   diphenhydrAMINE  (BENADRYL ) capsule 25 mg  25 mg Oral Once Kefalas, Thomas S, PA-C       diphenhydrAMINE  (BENADRYL ) capsule 25 mg  25 mg Oral Once Kefalas, Thomas S, PA-C       sodium chloride  0.9 % injection 10 mL  10 mL Intracatheter PRN Berry Debby RAMAN, PA-C   10 mL at 08/25/15 1125    Past Medical History:  Diagnosis Date   Allergic rhinitis    Anxiety    Asthma    B12 deficiency 01/18/2015   Cataract    Cavitary lung disease    Chronic sinusitis    COPD (chronic obstructive pulmonary disease) (HCC)    Depression    Diffuse large B-cell lymphoma (HCC)    DVT (deep venous thrombosis) (HCC) 09/25/2014   Essential hypertension    GERD (gastroesophageal reflux disease)    Hematuria 04/07/2016   History of pneumonia 01/2013   Hyperlipidemia    Hypogammaglobulinemia, acquired (HCC)    Iron  deficiency anemia 01/18/2015   Myocardial infarction (HCC) 2011   Narcolepsy without cataplexy(347.00) 01/18/2015   Orofacial dyskinesia 04/22/2015   Osteoporosis    Peripheral neuropathy due to chemotherapy (HCC) 05/02/2016   Sleep apnea    SVC syndrome 09/25/2014      Objective:     BP 131/80   Pulse 78   Ht 5' (1.524 m)   Wt 122 lb 3.2 oz (55.4 kg)   SpO2 96% Comment: ra  BMI 23.87 kg/m   SpO2: 96 % (ra)  amb  pleasant wf / rattle cough      HEENT : Oropharynx  clear      Nasal turbinates nl    NECK :  without  apparent JVD/ palpable Nodes/TM    LUNGS: no acc muscle use,  Mod kyphotic contour chest with sonorous mid exp rhonchi bilaterally without cough on insp or exp  maneuvers   CV:  RRR  no s3  1-2/6 SEM and 2-3/6 Diastolic murmur s increase in P2, and no edema   ABD:  soft and nontender   MS:  Gait rapid/   ext warm without deformities Or obvious joint restrictions  calf tenderness, cyanosis or clubbing    SKIN: warm and dry without lesions    NEURO:  alert, approp, nl sensorium with  no motor or cerebellar deficits apparent.        Assessment   Asthmatic bronchitis , chronic (HCC) Never smoker/ onset was 2007 (Dr Brien eval)with assoc chronic sinusitis s/p sins surgery prior to 2010 - PFTs 04/30/15 minimal airflow obstruction with nl fev1/fvc and mild curvature on f/v loop - 07/31/2023  After extensive coaching inhaler device,  effectiveness =  75% (short Ti for hfa) symbicort  80  - 07/31/2023   Walked on RA   x  3  lap(s) =  approx 450  ft  @ fast pace, stopped due to end of study  with lowest 02 sats 95% and no desats  on maint = accolate  only   I would not be at all surprised if she did not also have bronchiectasis at this point so the emphasis should be improving airflow/ mucociliary clearance s suppressing compromised immune system > Try add symbicort  80 2bid and return for pfts  Discussed in detail all the  indications, usual  risks and alternatives  relative to the benefits with patient who agrees to proceed with Rx as outlined.          Valvular heart disease Echo  05/25/22 Hyperdynamic LV, Mild AR/ MR  with nl LA followed by Debera   Surprising level of function considering combined with AB with key = keeping afterload down > defer to Cards/ PCP and will focus here on the pulmonary issues/ advised   Each maintenance medication was reviewed in detail including  emphasizing most importantly the difference between maintenance and prns and under what circumstances the prns are to be triggered using an action plan format where appropriate.  Total time for H and P, chart review, counseling, reviewing hfa device(s) , directly observing portions of ambulatory 02 saturation study/ and generating customized AVS unique to this office visit / same day charting = 64 min complex new pt eval                   [name] America, MD 07/31/2023

## 2023-07-31 ENCOUNTER — Encounter: Payer: Self-pay | Admitting: Internal Medicine

## 2023-07-31 ENCOUNTER — Ambulatory Visit (HOSPITAL_COMMUNITY)
Admission: RE | Admit: 2023-07-31 | Discharge: 2023-07-31 | Disposition: A | Discharge status: Home / Self Care | Source: Ambulatory Visit | Attending: Internal Medicine | Admitting: Internal Medicine

## 2023-07-31 ENCOUNTER — Ambulatory Visit: Admitting: Internal Medicine

## 2023-07-31 VITALS — BP 131/80 | HR 78 | Ht 60.0 in | Wt 122.2 lb

## 2023-07-31 DIAGNOSIS — R0602 Shortness of breath: Secondary | ICD-10-CM | POA: Diagnosis not present

## 2023-07-31 DIAGNOSIS — I38 Endocarditis, valve unspecified: Secondary | ICD-10-CM | POA: Insufficient documentation

## 2023-07-31 DIAGNOSIS — J449 Chronic obstructive pulmonary disease, unspecified: Secondary | ICD-10-CM | POA: Diagnosis not present

## 2023-07-31 DIAGNOSIS — J4489 Other specified chronic obstructive pulmonary disease: Secondary | ICD-10-CM | POA: Diagnosis not present

## 2023-07-31 DIAGNOSIS — R053 Chronic cough: Secondary | ICD-10-CM | POA: Diagnosis not present

## 2023-07-31 MED ORDER — BUDESONIDE-FORMOTEROL FUMARATE 80-4.5 MCG/ACT IN AERO: INHALATION_SPRAY | RESPIRATORY_TRACT | 12 refills | Status: DC

## 2023-07-31 MED ORDER — ALBUTEROL SULFATE HFA 108 (90 BASE) MCG/ACT IN AERS: 2.0000 | INHALATION_SPRAY | RESPIRATORY_TRACT | 2 refills | Status: AC | PRN

## 2023-07-31 NOTE — Patient Instructions (Addendum)
 Plan A = Automatic = Always=    Symbicort  80 Take 2 puffs first thing in am and then another 2 puffs about 12 hours later.    Work on inhaler technique:  relax and gently blow all the way out then take a nice smooth full deep breath back in, triggering the inhaler at same time you start breathing in.  Hold breath in for at least  5 seconds if you can. Blow out symbicort   thru nose. Rinse and gargle with water  when done.  If mouth or throat bother you at all,  try brushing teeth/gums/tongue with arm and hammer toothpaste/ make a slurry and gargle and spit out.      Plan B = Backup (to supplement plan A, not to replace it) Use your albuterol  inhaler as a rescue medication to be used if you can't catch your breath by resting or slowing your pace  or doing a relaxed purse lip breathing pattern.  - The less you use it, the better it will work when you need it. - Ok to use the inhaler up to 2 puffs  every 4 hours if you must but call for appointment if use goes up over your usual need - Don't leave home without it !!  (think of it like the spare tire or starter fluid for your car)   Plan C = Crisis (instead of Plan B but only if Plan B stops working) - only use your albuterol  nebulizer if you first try Plan B and it fails to help > ok to use the nebulizer up to every 4 hours but if start needing it regularly call for immediate appointment  Also  Ok to try albuterol  15 min before an activity (on alternating days)  that you know would usually make you short of breath and see if it makes any difference and if makes none then don't take albuterol  after activity unless you can't catch your breath as this means it's the resting that helps, not the albuterol .  Please remember to go to the  x-ray department  @  [facility] for your tests - we will call you with the results when they are available         Please schedule a follow up visit in 3 months but call sooner if needed with PFTs on return

## 2023-07-31 NOTE — Assessment & Plan Note (Addendum)
 Never smoker/ onset was 2007 (Dr Brien eval)with assoc chronic sinusitis s/p sins surgery prior to 2010 - PFTs 04/30/15 minimal airflow obstruction with nl fev1/fvc and mild curvature on f/v loop - 07/31/2023  After extensive coaching inhaler device,  effectiveness =  75% (short Ti for hfa) symbicort  80  - 07/31/2023   Walked on RA   x  3  lap(s) =  approx 450  ft  @ fast pace, stopped due to end of study  with lowest 02 sats 95% and no desats  on maint = accolate  only   I would not be at all surprised if she did not also have bronchiectasis at this point so the emphasis should be improving airflow/ mucociliary clearance s suppressing compromised immune system > Try add symbicort  80 2bid and return for pfts  Discussed in detail all the  indications, usual  risks and alternatives  relative to the benefits with patient who agrees to proceed with Rx as outlined.

## 2023-07-31 NOTE — Assessment & Plan Note (Addendum)
 Echo  05/25/22 Hyperdynamic LV, Mild AR/ MR  with nl LA followed by Debera   Surprising level of function considering combined with AB with key = keeping afterload down > defer to Cards/ PCP and will focus here on the pulmonary issues/ advised   Each maintenance medication was reviewed in detail including emphasizing most importantly the difference between maintenance and prns and under what circumstances the prns are to be triggered using an action plan format where appropriate.  Total time for H and P, chart review, counseling, reviewing hfa device(s) , directly observing portions of ambulatory 02 saturation study/ and generating customized AVS unique to this office visit / same day charting = 64 min complex new pt eval

## 2023-08-04 ENCOUNTER — Ambulatory Visit: Payer: Self-pay | Admitting: Internal Medicine

## 2023-08-04 NOTE — Progress Notes (Signed)
 Spoke with pt regarding her labs, verbalized understanding NFN

## 2023-08-18 ENCOUNTER — Other Ambulatory Visit

## 2023-08-21 ENCOUNTER — Other Ambulatory Visit: Payer: Self-pay

## 2023-08-21 DIAGNOSIS — C8338 Diffuse large B-cell lymphoma, lymph nodes of multiple sites: Secondary | ICD-10-CM

## 2023-08-21 DIAGNOSIS — D801 Nonfamilial hypogammaglobulinemia: Secondary | ICD-10-CM

## 2023-08-21 DIAGNOSIS — D649 Anemia, unspecified: Secondary | ICD-10-CM

## 2023-08-21 DIAGNOSIS — D508 Other iron deficiency anemias: Secondary | ICD-10-CM

## 2023-08-22 ENCOUNTER — Inpatient Hospital Stay: Attending: Hematology

## 2023-08-22 DIAGNOSIS — D649 Anemia, unspecified: Secondary | ICD-10-CM

## 2023-08-22 DIAGNOSIS — D508 Other iron deficiency anemias: Secondary | ICD-10-CM

## 2023-08-22 DIAGNOSIS — C83398 Diffuse large b-cell lymphoma of other extranodal and solid organ sites: Secondary | ICD-10-CM | POA: Insufficient documentation

## 2023-08-22 DIAGNOSIS — D509 Iron deficiency anemia, unspecified: Secondary | ICD-10-CM | POA: Insufficient documentation

## 2023-08-22 DIAGNOSIS — D801 Nonfamilial hypogammaglobulinemia: Secondary | ICD-10-CM | POA: Insufficient documentation

## 2023-08-22 DIAGNOSIS — C8338 Diffuse large B-cell lymphoma, lymph nodes of multiple sites: Secondary | ICD-10-CM

## 2023-08-22 LAB — COMPREHENSIVE METABOLIC PANEL WITH GFR
ALT: 11 U/L (ref 0–44)
AST: 17 U/L (ref 15–41)
Albumin: 3.4 g/dL — ABNORMAL LOW (ref 3.5–5.0)
Alkaline Phosphatase: 94 U/L (ref 38–126)
Anion gap: 8 (ref 5–15)
BUN: 16 mg/dL (ref 8–23)
CO2: 28 mmol/L (ref 22–32)
Calcium: 9 mg/dL (ref 8.9–10.3)
Chloride: 103 mmol/L (ref 98–111)
Creatinine, Ser: 1.06 mg/dL — ABNORMAL HIGH (ref 0.44–1.00)
GFR, Estimated: 57 mL/min — ABNORMAL LOW (ref >60)
Glucose, Bld: 114 mg/dL — ABNORMAL HIGH (ref 70–99)
Potassium: 4.3 mmol/L (ref 3.5–5.1)
Sodium: 139 mmol/L (ref 135–145)
Total Bilirubin: 0.9 mg/dL (ref 0.0–1.2)
Total Protein: 7.3 g/dL (ref 6.5–8.1)

## 2023-08-22 LAB — CBC WITH DIFFERENTIAL/PLATELET
Abs Immature Granulocytes: 0.03 K/uL (ref 0.00–0.07)
Basophils Absolute: 0.1 K/uL (ref 0.0–0.1)
Basophils Relative: 1 %
Eosinophils Absolute: 0.2 K/uL (ref 0.0–0.5)
Eosinophils Relative: 2 %
HCT: 38.1 % (ref 36.0–46.0)
Hemoglobin: 12.1 g/dL (ref 12.0–15.0)
Immature Granulocytes: 0 %
Lymphocytes Relative: 15 %
Lymphs Abs: 1.4 K/uL (ref 0.7–4.0)
MCH: 31.6 pg (ref 26.0–34.0)
MCHC: 31.8 g/dL (ref 30.0–36.0)
MCV: 99.5 fL (ref 80.0–100.0)
Monocytes Absolute: 0.7 K/uL (ref 0.1–1.0)
Monocytes Relative: 7 %
Neutro Abs: 7.3 K/uL (ref 1.7–7.7)
Neutrophils Relative %: 75 %
Platelets: 177 K/uL (ref 150–400)
RBC: 3.83 MIL/uL — ABNORMAL LOW (ref 3.87–5.11)
RDW: 12.5 % (ref 11.5–15.5)
WBC: 9.7 K/uL (ref 4.0–10.5)
nRBC: 0 % (ref 0.0–0.2)

## 2023-08-22 LAB — LACTATE DEHYDROGENASE: LDH: 148 U/L (ref 98–192)

## 2023-08-22 LAB — MAGNESIUM: Magnesium: 2 mg/dL (ref 1.7–2.4)

## 2023-08-23 DIAGNOSIS — L089 Local infection of the skin and subcutaneous tissue, unspecified: Secondary | ICD-10-CM | POA: Diagnosis not present

## 2023-08-23 DIAGNOSIS — Z6821 Body mass index (BMI) 21.0-21.9, adult: Secondary | ICD-10-CM | POA: Diagnosis not present

## 2023-08-25 ENCOUNTER — Ambulatory Visit

## 2023-08-25 ENCOUNTER — Ambulatory Visit: Admitting: Oncology

## 2023-08-28 DIAGNOSIS — C44722 Squamous cell carcinoma of skin of right lower limb, including hip: Secondary | ICD-10-CM | POA: Diagnosis not present

## 2023-08-28 DIAGNOSIS — D485 Neoplasm of uncertain behavior of skin: Secondary | ICD-10-CM | POA: Diagnosis not present

## 2023-08-28 NOTE — Assessment & Plan Note (Addendum)
-   She is receiving IVIG every 12 weeks. - She reported a skin lesion on her right leg that was causing pain.  She was given doxycycline without any change.  Dermatologist removed yesterday. - We reviewed labs from 08/22/2023: Normal LFTs.  LDH normal.  CBC was normal.  IgG level remains persistently elevated at 1707 although it was last drawn in May. It was elevated last 4 times ranging between 1700-1800. -Continue IVIG every 12 weeks as her trough levels were elevated.   -No increase in infections although she did have a skin lesion on her right leg that was removed by dermatology.  No other upper respiratory type infections.  Per Dr. Charmain last note, she may resume with every 12-week IVIG infusions first every 8.  Will recheck all of her labs including her IgM levels at her next visit.

## 2023-08-28 NOTE — Progress Notes (Signed)
 [name] OFFICE PROGRESS NOTE  [name] Rush, MD  ASSESSMENT & PLAN:    Assessment & Plan Hypogammaglobulinemia, acquired [facility]) - She is receiving IVIG every 12 weeks. - She reported a skin lesion on her right leg that was causing pain.  She was given doxycycline without any change.  Dermatologist removed yesterday. - We reviewed labs from 08/22/2023: Normal LFTs.  LDH normal.  CBC was normal.  IgG level remains persistently elevated at 1707 although it was last drawn in May. It was elevated last 4 times ranging between 1700-1800. -Continue IVIG every 12 weeks as her trough levels were elevated.   -No increase in infections although she did have a skin lesion on her right leg that was removed by dermatology.  No other upper respiratory type infections.  Per Dr. Charmain last note, she may resume with every 12-week IVIG infusions first every 8.  Will recheck all of her labs including her IgM levels at her next visit. Other iron  deficiency anemia - Hemoglobin is normal at 12.1.  Last ferritin is 171 saturation 30    Diffuse large B-cell lymphoma of lymph nodes of multiple regions (HCC) - She does not have any B symptoms.  No palpable adenopathy or splenomegaly.     Orders Placed This Encounter  Procedures   CBC with Differential    Standing Status:   Future    Expected Date:   02/28/2024    Expiration Date:   05/28/2024   Comprehensive metabolic panel    Standing Status:   Future    Expected Date:   02/28/2024    Expiration Date:   05/28/2024   Lactate dehydrogenase    Standing Status:   Future    Expected Date:   02/28/2024    Expiration Date:   05/28/2024   Magnesium    Standing Status:   Future    Expected Date:   02/28/2024    Expiration Date:   05/28/2024    INTERVAL HISTORY: Patient returns for follow-up.  Continues to receive IVIG every 12 weeks last on 06/01/2023.  Tolerating well.  IVIG was switched from every 8 weeks to 12 weeks due to elevated IgG  levels.  Followed by cardiology for hypertension and high cholesterol.  Has mild aortic stenosis with mild aortic regurgitation seen on echo in May 2024.Followed by pulmonology for asthma.  Last seen on 07/23/2023.  She denies any recurrent infections although she did have a skin infection on her right leg.  Reports she was given doxycycline and ultimately it was removed by dermatology yesterday.  Otherwise, no recurrent infections.  She has a cough and shortness of breath secondary to asthma.  We reviewed cbc, cmp, ldh, mag.  SUMMARY OF HEMATOLOGIC HISTORY: Oncology History  Non Hodgkin's lymphoma (HCC)  09/09/2009 Initial Diagnosis   Non Hodgkin's lymphoma (HCC)     1. Acquired hypogammaglobulinemia: -She has been receiving IVIG every 8 weeks since January 2020. -She does have severely low IgA levels.  However he does not have any reactions.   2.  DLBCL, stage IVb: -She was diagnosed 07/08/2009.  Treated with 4 cycles of R-CHOP with intrathecal methotrexate during cycle 2 and 4.  She developed a Pseudomonas sepsis after cycles 2 and 4 and therapy was stopped after 4 cycles.  She also received 2 years of maintenance rituximab . -Last PET scan on 03/20/2017 did not show any evidence of lymphoma.   3.  Health maintenance: -Mammogram on 05/12/22, BI-RADS Category 1.  CBC    Component Value Date/Time   WBC 9.7 08/22/2023 0743   RBC 3.83 (L) 08/22/2023 0743   HGB 12.1 08/22/2023 0743   HCT 38.1 08/22/2023 0743   PLT 177 08/22/2023 0743   MCV 99.5 08/22/2023 0743   MCH 31.6 08/22/2023 0743   MCHC 31.8 08/22/2023 0743   RDW 12.5 08/22/2023 0743   LYMPHSABS 1.4 08/22/2023 0743   MONOABS 0.7 08/22/2023 0743   EOSABS 0.2 08/22/2023 0743   BASOSABS 0.1 08/22/2023 0743       Latest Ref Rng & Units 08/22/2023    7:43 AM 05/25/2023    8:45 AM 04/06/2023    7:54 AM  CMP  Glucose 70 - 99 mg/dL 885  77  881   BUN 8 - 23 mg/dL 16  16  25    Creatinine 0.44 - 1.00 mg/dL 8.93  9.03  8.76    Sodium 135 - 145 mmol/L 139  137  136   Potassium 3.5 - 5.1 mmol/L 4.3  3.8  4.4   Chloride 98 - 111 mmol/L 103  101  101   CO2 22 - 32 mmol/L 28  29  26    Calcium  8.9 - 10.3 mg/dL 9.0  8.9  9.1   Total Protein 6.5 - 8.1 g/dL 7.3  7.3  8.0   Total Bilirubin 0.0 - 1.2 mg/dL 0.9  0.6  0.7   Alkaline Phos 38 - 126 U/L 94  103  97   AST 15 - 41 U/L 17  19  21    ALT 0 - 44 U/L 11  11  13       Lab Results  Component Value Date   FERRITIN 171 05/25/2023   VITAMINB12 1,112 (H) 10/07/2019    Vitals:   08/29/23 0945  BP: 123/77  Pulse: 69  Resp: 18  Temp: 97.7 F (36.5 C)  SpO2: 100%    Review of System:  Review of Systems  Constitutional:  Positive for malaise/fatigue.  Respiratory:  Positive for cough and shortness of breath.     Physical Exam: Physical Exam Constitutional:      Appearance: Normal appearance.  HENT:     Head: Normocephalic and atraumatic.  Eyes:     Pupils: Pupils are equal, round, and reactive to light.  Cardiovascular:     Rate and Rhythm: Normal rate and regular rhythm.     Heart sounds: Normal heart sounds. No murmur heard. Pulmonary:     Effort: Pulmonary effort is normal.     Breath sounds: Normal breath sounds. No wheezing.  Abdominal:     General: Bowel sounds are normal. There is no distension.     Palpations: Abdomen is soft.     Tenderness: There is no abdominal tenderness.  Musculoskeletal:        General: Normal range of motion.     Cervical back: Normal range of motion.  Skin:    General: Skin is warm and dry.     Findings: No rash.  Neurological:     Mental Status: She is alert and oriented to person, place, and time.     Gait: Gait is intact.  Psychiatric:        Mood and Affect: Mood and affect normal.        Cognition and Memory: Memory normal.        Judgment: Judgment normal.      I spent 20 minutes dedicated to the care of this patient (face-to-face and non-face-to-face) on the date  of the encounter to include what  is described in the assessment and plan.,  [name] Hope, NP 09/05/2023 12:53 PM

## 2023-08-28 NOTE — Assessment & Plan Note (Addendum)
-   She does not have any B symptoms.  No palpable adenopathy or splenomegaly.

## 2023-08-29 ENCOUNTER — Inpatient Hospital Stay (HOSPITAL_BASED_OUTPATIENT_CLINIC_OR_DEPARTMENT_OTHER): Admitting: Oncology

## 2023-08-29 ENCOUNTER — Inpatient Hospital Stay

## 2023-08-29 VITALS — BP 123/77 | HR 69 | Temp 97.7°F | Resp 18 | Wt 123.4 lb

## 2023-08-29 VITALS — BP 151/74 | HR 64 | Temp 96.8°F | Resp 18

## 2023-08-29 DIAGNOSIS — D509 Iron deficiency anemia, unspecified: Secondary | ICD-10-CM | POA: Diagnosis not present

## 2023-08-29 DIAGNOSIS — C83398 Diffuse large b-cell lymphoma of other extranodal and solid organ sites: Secondary | ICD-10-CM | POA: Diagnosis not present

## 2023-08-29 DIAGNOSIS — C8338 Diffuse large B-cell lymphoma, lymph nodes of multiple sites: Secondary | ICD-10-CM

## 2023-08-29 DIAGNOSIS — D508 Other iron deficiency anemias: Secondary | ICD-10-CM

## 2023-08-29 DIAGNOSIS — D649 Anemia, unspecified: Secondary | ICD-10-CM

## 2023-08-29 DIAGNOSIS — D801 Nonfamilial hypogammaglobulinemia: Secondary | ICD-10-CM

## 2023-08-29 MED ORDER — IMMUNE GLOBULIN (HUMAN) 10 GM/100ML IV SOLN
25.0000 g | Freq: Once | INTRAVENOUS | Status: AC
  Administered 2023-08-29: 25 g via INTRAVENOUS
  Filled 2023-08-29: qty 250

## 2023-08-29 MED ORDER — DEXTROSE 5 % IV SOLN: INTRAVENOUS | Status: DC

## 2023-08-29 NOTE — Progress Notes (Signed)
  Patient presents today for IVIG infusion. Patient is in satisfactory condition with no new complaints voiced.  IV  flushed well with good blood return. Vital signs are stable.  Labs reviewed and will proceed with treatment per MD orders.    Patient tolerated IVIG well with no complaints voiced.  Patient left ambulatory in stable condition.  Vital signs stable at discharge.  Follow up as scheduled.      [name] Oil

## 2023-08-29 NOTE — Patient Instructions (Signed)
 [facility] [place] - A DEPT OF [facility]  Discharge Instructions: Thank you for choosing [facility] to provide your oncology and hematology care.  If you have a lab appointment with the Cancer Center - please note that after April 8th, 2024, all labs will be drawn in the cancer center.  You do not have to check in or register with the main entrance as you have in the past but will complete your check-in in the cancer center.  Wear comfortable clothing and clothing appropriate for easy access to any Portacath or PICC line.   We strive to give you quality time with your provider. You may need to reschedule your appointment if you arrive late (15 or more minutes).  Arriving late affects you and other patients whose appointments are after yours.  Also, if you miss three or more appointments without notifying the office, you may be dismissed from the clinic at the provider's discretion.      For prescription refill requests, have your pharmacy contact our office and allow 72 hours for refills to be completed.    Today you received the following chemotherapy and/or immunotherapy agents IVIG      To help prevent nausea and vomiting after your treatment, we encourage you to take your nausea medication as directed.  BELOW ARE SYMPTOMS THAT SHOULD BE REPORTED IMMEDIATELY: *FEVER GREATER THAN 100.4 F (38 C) OR HIGHER *CHILLS OR SWEATING *NAUSEA AND VOMITING THAT IS NOT CONTROLLED WITH YOUR NAUSEA MEDICATION *UNUSUAL SHORTNESS OF BREATH *UNUSUAL BRUISING OR BLEEDING *URINARY PROBLEMS (pain or burning when urinating, or frequent urination) *BOWEL PROBLEMS (unusual diarrhea, constipation, pain near the anus) TENDERNESS IN MOUTH AND THROAT WITH OR WITHOUT PRESENCE OF ULCERS (sore throat, sores in mouth, or a toothache) UNUSUAL RASH, SWELLING OR PAIN  UNUSUAL VAGINAL DISCHARGE OR ITCHING   Items with * indicate a potential emergency and should be followed up as  soon as possible or go to the Emergency Department if any problems should occur.  Please show the CHEMOTHERAPY ALERT CARD or IMMUNOTHERAPY ALERT CARD at check-in to the Emergency Department and triage nurse.  Should you have questions after your visit or need to cancel or reschedule your appointment, please contact [facility] [place] - A DEPT OF [facility] [phone]  and follow the prompts.  Office hours are 8:00 a.m. to 4:30 p.m. Monday - Friday. Please note that voicemails left after 4:00 p.m. may not be returned until the following business day.  We are closed weekends and major holidays. You have access to a nurse at all times for urgent questions. Please call the main number to the clinic [phone] and follow the prompts.  For any non-urgent questions, you may also contact your provider using MyChart. We now offer e-Visits for anyone 47 and older to request care online for non-urgent symptoms. For details visit [email or web address].   Also download the MyChart app! Go to the app store, search "MyChart", open the app, select [place], and log in with your MyChart username and password.

## 2023-09-05 ENCOUNTER — Encounter (HOSPITAL_COMMUNITY): Payer: Self-pay | Admitting: Oncology

## 2023-09-05 NOTE — Assessment & Plan Note (Signed)
-   Hemoglobin is normal at 12.1.  Last ferritin is 171 saturation 30

## 2023-09-07 DIAGNOSIS — C44729 Squamous cell carcinoma of skin of left lower limb, including hip: Secondary | ICD-10-CM | POA: Diagnosis not present

## 2023-10-24 ENCOUNTER — Ambulatory Visit (INDEPENDENT_AMBULATORY_CARE_PROVIDER_SITE_OTHER): Admitting: Internal Medicine

## 2023-10-24 ENCOUNTER — Encounter: Payer: Self-pay | Admitting: Internal Medicine

## 2023-10-24 ENCOUNTER — Ambulatory Visit (HOSPITAL_COMMUNITY)
Admission: RE | Admit: 2023-10-24 | Discharge: 2023-10-24 | Disposition: A | Discharge status: Home / Self Care | Source: Ambulatory Visit | Attending: Internal Medicine | Admitting: Internal Medicine

## 2023-10-24 VITALS — BP 130/74 | HR 75 | Ht 60.0 in | Wt 127.4 lb

## 2023-10-24 DIAGNOSIS — J4489 Other specified chronic obstructive pulmonary disease: Secondary | ICD-10-CM

## 2023-10-24 DIAGNOSIS — I38 Endocarditis, valve unspecified: Secondary | ICD-10-CM

## 2023-10-24 MED ORDER — BREZTRI AEROSPHERE 160-9-4.8 MCG/ACT IN AERO: INHALATION_SPRAY | RESPIRATORY_TRACT | 11 refills | Status: AC

## 2023-10-24 MED ORDER — ALBUTEROL SULFATE (2.5 MG/3ML) 0.083% IN NEBU
2.5000 mg | INHALATION_SOLUTION | Freq: Once | RESPIRATORY_TRACT | Status: AC
  Administered 2023-10-24: 2.5 mg via RESPIRATORY_TRACT

## 2023-10-24 MED ORDER — BUDESONIDE-FORMOTEROL FUMARATE 160-4.5 MCG/ACT IN AERO: INHALATION_SPRAY | RESPIRATORY_TRACT | 12 refills | Status: DC

## 2023-10-24 MED ORDER — BREZTRI AEROSPHERE 160-9-4.8 MCG/ACT IN AERO: 2.0000 | INHALATION_SPRAY | Freq: Two times a day (BID) | RESPIRATORY_TRACT | Status: AC

## 2023-10-24 MED ORDER — PREDNISONE 10 MG PO TABS: ORAL_TABLET | ORAL | 0 refills | Status: AC

## 2023-10-24 NOTE — Patient Instructions (Addendum)
 If get worse >  Prednisone  10 mg take  4 each am x 2 days,   2 each am x 2 days,  1 each am x 2 days and stop   Change to  Breztri  to 160 Take 2 puffs first thing in am and then another 2 puffs about 12 hours later.    Please schedule a follow up visit in 6  months but call sooner if needed - call in meantime if you would like to see Dr Iva about an infusion

## 2023-10-24 NOTE — Progress Notes (Unsigned)
 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)  assoc with chronic sinusitis s/p Shoemaker surgery prior to 2010   NH lymphoma chemo but no RT last rx 2012 > f/u immunoglobulin shots per K q 2 months   Pt not  seen by PCCM service since 2020 [place] dx Asthma   History of Present Illness  07/31/2023  Pulmonary/ 1st office eval/ [place] / [place] [facility]  Chief Complaint  Patient presents with   Establish Care  Dyspnea:  100 ft to garden some of it uphill stops half way most times/ walking at [employer] faster than other  Cough: sporadic/ no am or noct flare> minimal yellowish assoc with chronic sinusitis s/p 2 sinus surgeries last in 2011  Sleep: bed is flat is wedge pillow  SABA use: rare HFA / only does Neb during flares few and far between 02: none  Rec Plan A = Automatic = Always=    Symbicort  80 Take 2 puffs first thing in am and then another 2 puffs about 12 hours later.  Work on inhaler technique:    Plan B = Backup (to supplement plan A, not to replace it) Use your albuterol  inhaler as a rescue medication  Plan C = Crisis (instead of Plan B but only if Plan B stops working) - only use your albuterol  nebulizer if you first try Plan B Also  Ok to try albuterol  15 min before an activity (on alternating days)  that you know would usually make you short of breath  Cxr no acute findings   PFT's  10/24/2023   FEV1 1.08 (55 % ) ratio 0.62  p 14 % improvement from saba p 0 prior to study  and FV curve moderate concavity    10/24/2023  f/u ov/[place] office/Deandra Gadson re: severe chronic asthma / valvular ht dz  maint on symbicort   80 Chief Complaint  Patient presents with   Asthma    Pft 10/21 Cough sometimes  Dyspnea:  improved activity tolerance since started  symbicort   Cough: yellowish mucus sporadic perfume makes it worse Sleeping: flat bed and wedge pillow s resp cc  SABA use: not much  02: none     No obvious day to day or daytime variability or assoc excess/ purulent sputum or mucus plugs or hemoptysis or cp or chest tightness, subjective wheeze or overt sinus or hb symptoms.    Also denies any obvious fluctuation of symptoms with weather or environmental changes or other aggravating or alleviating factors except as outlined above   No unusual exposure hx or h/o childhood pna/ asthma or knowledge of premature birth.  Current Allergies, Complete Past Medical History, Past Surgical History, Family History, and Social History were reviewed in [employer] record.  ROS  The following are not active complaints unless bolded Hoarseness, sore throat, dysphagia, dental problems, itching, sneezing,  nasal congestion or discharge of excess mucus or purulent secretions, ear ache,   fever, chills, sweats, unintended wt loss or wt gain, classically pleuritic or exertional cp,  orthopnea pnd or arm/hand swelling  or leg swelling, presyncope, palpitations, abdominal pain, anorexia, nausea, vomiting, diarrhea  or change in bowel habits or change in bladder habits, change in stools or  change in urine, dysuria, hematuria,  rash, arthralgias, visual complaints, headache, numbness, weakness or ataxia or problems with walking or coordination,  change in mood or  memory.        Current Meds  Medication Sig   acetaminophen  (TYLENOL ) 500 MG tablet Take 1,000 mg by mouth every 6 (six) hours as needed for moderate pain or headache.    acyclovir  (ZOVIRAX ) 400 MG tablet TAKE ONE TABLET BY MOUTH TWICE DAILY.   albuterol  (PROAIR  HFA) 108 (90 Base) MCG/ACT inhaler Inhale 2 puffs into the lungs every 4 (four) hours as needed.   albuterol  (PROVENTIL ) (2.5 MG/3ML) 0.083% nebulizer solution Take 2.5 mg by nebulization every 4 (four) hours as needed for  wheezing or shortness of breath.   amphetamine-dextroamphetamine (ADDERALL) 10 MG tablet Take 10 mg by mouth every morning.    Armodafinil 150 MG tablet Take 150 mg by mouth every morning.    aspirin EC 81 MG tablet Take 81 mg by mouth daily. Swallow whole.   budesonide -formoterol  (SYMBICORT ) 80-4.5 MCG/ACT inhaler Take 2 puffs first thing in am and then another 2 puffs about 12 hours later.   celecoxib (CELEBREX) 200 MG capsule Take 200 mg by mouth 2 (two) times daily as needed.   citalopram  (CELEXA ) 10 MG tablet Take 10 mg by mouth daily.   cyanocobalamin  1000 MCG tablet Take 1,000 mcg by mouth daily.   doxycycline (VIBRA-TABS) 100 MG tablet Take 100 mg by mouth 2 (two) times daily.   gabapentin  (NEURONTIN ) 600 MG tablet 300 mg 2 (two) times daily.    olmesartan (BENICAR) 20 MG tablet Take 20 mg by mouth daily.   omeprazole  (PRILOSEC) 40 MG capsule Take 40 mg by mouth daily.   simvastatin  (ZOCOR ) 40 MG tablet Take 40 mg by mouth daily.   zafirlukast  (ACCOLATE ) 20 MG tablet TAKE 1 TABLET BY MOUTH TWICE DAILY.            Past Medical History:  Diagnosis Date   Allergic rhinitis    Anxiety    Asthma    B12 deficiency 01/18/2015   Cataract    Cavitary lung disease    Chronic sinusitis    COPD (chronic obstructive pulmonary disease) (HCC)    Depression    Diffuse large B-cell lymphoma (HCC)    DVT (deep venous thrombosis) (HCC) 09/25/2014   Essential hypertension    GERD (gastroesophageal reflux disease)    Hematuria 04/07/2016   History of pneumonia 01/2013   Hyperlipidemia    Hypogammaglobulinemia, acquired (HCC)    Iron  deficiency anemia 01/18/2015   Myocardial infarction (HCC) 2011   Narcolepsy without cataplexy(347.00) 01/18/2015   Orofacial dyskinesia 04/22/2015   Osteoporosis    Peripheral neuropathy due to chemotherapy (HCC) 05/02/2016   Sleep apnea    SVC syndrome 09/25/2014      Objective:    Wt Readings from Last 3 Encounters:  10/24/23 127 lb 6.4 oz (57.8 kg)   08/29/23 123 lb 6.4 oz (56 kg)  07/31/23 122 lb 3.2 oz (55.4 kg)      Vital signs reviewed  10/24/2023  - Note at rest 02 sats  89% on RA    General appearance:    amb pleasant wf nad     HEENT : Oropharynx  clear/ no thrush     Nasal turbinates nl    NECK :  without  apparent JVD/ palpable Nodes/TM    LUNGS: no acc muscle use,  Nl contour chest with insp squeaks/ pops and sonorous exp rhonchi  bilaterally without cough on insp or exp maneuvers   CV:  RRR  no s3 2/6 SEM s  increase in P2, and no edema   ABD:  soft and nontender   MS:  Gait nl   ext warm without deformities Or obvious joint restrictions  calf tenderness, cyanosis or clubbing    SKIN: warm and dry without lesions    NEURO:  alert, approp, nl sensorium with  no motor or cerebellar deficits apparent.          Assessment   Assessment & Plan Asthmatic bronchitis , chronic (HCC) Never smoker/ onset was 2007 (Dr Brien eval)with assoc chronic sinusitis s/p sins surgery prior to 2010 - PFTs 04/30/15 minimal airflow obstruction with nl fev1/fvc and mild curvature on f/v loop - Chest CT cuts on abd CT  02/23/18 Lung bases demonstrate minimal bronchiectasis in the lingula with mild scarring at the right middle lobe and lingula - 07/31/2023  After extensive coaching inhaler device,  effectiveness =  75% (short Ti for hfa) symbicort  80  - 07/31/2023   Walked on RA   x  3  lap(s) =  approx 450  ft  @ fast pace, stopped due to end of study  with lowest 02 sats 95% and no desats  on accolate  only  PFT's  10/24/2023   FEV1 1.08 (55 % ) ratio 0.62  p 14 % improvement from saba p 0 prior to study  and FV curve moderate concavity   >>>>Try change to [employer] 2bid , low threshold for adding  biologic rx          Each maintenance medication was reviewed in detail including emphasizing most importantly the difference between maintenance and prns and under what circumstances the prns are to be triggered using an action plan format  as per prior AVS  Total time for H and P, chart review, counseling, reviewing hfa  device(s) and generating customized AVS unique to this office visit / same day charting = 33 min          AVS  Patient Instructions  If get worse >  Prednisone  10 mg take  4 each am x 2 days,   2 each am x 2 days,  1 each am x 2 days and stop   Change to  Breztri  to 160 Take 2 puffs first thing in am and then another 2 puffs about 12 hours later.    Please schedule a follow up visit in 6  months but call sooner if needed - call in meantime if you would like to see Dr Iva about an infusion        Shree Espey, MD 10/25/2023

## 2023-10-25 NOTE — Assessment & Plan Note (Addendum)
 Never smoker/ onset was 2007 (Dr Brien eval)with assoc chronic sinusitis s/p sins surgery prior to 2010 - PFTs 04/30/15 minimal airflow obstruction with nl fev1/fvc and mild curvature on f/v loop - Chest CT cuts on abd CT  02/23/18 Lung bases demonstrate minimal bronchiectasis in the lingula with mild scarring at the right middle lobe and lingula - 07/31/2023  After extensive coaching inhaler device,  effectiveness =  75% (short Ti for hfa) symbicort  80  - 07/31/2023   Walked on RA   x  3  lap(s) =  approx 450  ft  @ fast pace, stopped due to end of study  with lowest 02 sats 95% and no desats  on accolate  only  PFT's  10/24/2023   FEV1 1.08 (55 % ) ratio 0.62  p 14 % improvement from saba p 0 prior to study  and FV curve moderate concavity   >>>>Try change to [employer] 2bid , low threshold for adding  biologic rx          Each maintenance medication was reviewed in detail including emphasizing most importantly the difference between maintenance and prns and under what circumstances the prns are to be triggered using an action plan format as per prior AVS  Total time for H and P, chart review, counseling, reviewing hfa  device(s) and generating customized AVS unique to this office visit / same day charting = 33 min

## 2023-10-29 LAB — PULMONARY FUNCTION TEST
DL/VA % pred: 94 %
DL/VA: 4.08 ml/min/mmHg/L
DLCO unc % pred: 72 %
DLCO unc: 12.21 ml/min/mmHg
FEF 25-75 Post: 0.67 L/s
FEF 25-75 Pre: 0.48 L/s
FEF2575-%Change-Post: 39 %
FEF2575-%Pred-Post: 38 %
FEF2575-%Pred-Pre: 27 %
FEV1-%Change-Post: 14 %
FEV1-%Pred-Post: 55 %
FEV1-%Pred-Pre: 48 %
FEV1-Post: 1.08 L
FEV1-Pre: 0.95 L
FEV1FVC-%Change-Post: 2 %
FEV1FVC-%Pred-Pre: 79 %
FEV6-%Change-Post: 12 %
FEV6-%Pred-Post: 68 %
FEV6-%Pred-Pre: 60 %
FEV6-Post: 1.7 L
FEV6-Pre: 1.5 L
FEV6FVC-%Change-Post: 1 %
FEV6FVC-%Pred-Post: 101 %
FEV6FVC-%Pred-Pre: 100 %
FVC-%Change-Post: 11 %
FVC-%Pred-Post: 67 %
FVC-%Pred-Pre: 60 %
FVC-Post: 1.75 L
Post FEV1/FVC ratio: 62 %
Post FEV6/FVC ratio: 97 %
Pre FEV1/FVC ratio: 61 %
Pre FEV6/FVC Ratio: 96 %
RV % pred: 170 %
RV: 3.32 L
TLC % pred: 110 %
TLC: 4.93 L

## 2023-11-14 ENCOUNTER — Inpatient Hospital Stay

## 2023-11-16 ENCOUNTER — Inpatient Hospital Stay

## 2023-11-18 ENCOUNTER — Ambulatory Visit

## 2023-11-18 ENCOUNTER — Ambulatory Visit
Admission: EM | Admit: 2023-11-18 | Discharge: 2023-11-18 | Disposition: A | Discharge status: Home / Self Care | Attending: Family Medicine | Admitting: Family Medicine

## 2023-11-18 DIAGNOSIS — J441 Chronic obstructive pulmonary disease with (acute) exacerbation: Secondary | ICD-10-CM

## 2023-11-18 DIAGNOSIS — S29019A Strain of muscle and tendon of unspecified wall of thorax, initial encounter: Secondary | ICD-10-CM

## 2023-11-18 MED ORDER — TIZANIDINE HCL 4 MG PO TABS: 4.0000 mg | ORAL_TABLET | Freq: Three times a day (TID) | ORAL | 0 refills | Status: AC | PRN

## 2023-11-18 MED ORDER — PREDNISONE 20 MG PO TABS: 40.0000 mg | ORAL_TABLET | Freq: Every day | ORAL | 0 refills | Status: AC

## 2023-11-18 MED ORDER — ALBUTEROL SULFATE (2.5 MG/3ML) 0.083% IN NEBU: 2.5000 mg | INHALATION_SOLUTION | RESPIRATORY_TRACT | Status: DC

## 2023-11-18 MED ORDER — ALBUTEROL SULFATE (2.5 MG/3ML) 0.083% IN NEBU
2.5000 mg | INHALATION_SOLUTION | RESPIRATORY_TRACT | Status: AC
  Administered 2023-11-18: 2.5 mg via RESPIRATORY_TRACT

## 2023-11-18 MED ORDER — ALBUTEROL SULFATE (2.5 MG/3ML) 0.083% IN NEBU: 2.5000 mg | INHALATION_SOLUTION | RESPIRATORY_TRACT | Status: DC | PRN

## 2023-11-18 NOTE — ED Notes (Signed)
 Per provider albuterol  nebulizer has been administered due to patient O2 sat being 88%, with history of asthma. Pt's O2 after neb is 99%.

## 2023-11-18 NOTE — ED Provider Notes (Signed)
 [facility]    CSN: [id number] Arrival date & time: 11/18/23  1019      History   Chief Complaint No chief complaint on file.   HPI [name] is a 68 y.o. female.   Patient presenting today with 1 week history of bilateral mid back pain that started around the time she took a car trip on vacation.  She denies known injury to the area, falls, radiation down arms or legs, numbness, tingling, weakness, bowel or bladder continence, saddle anesthesias, urinary symptoms, fevers.  States pain is worse with movement, deep breathing.  So far trying heat, Tylenol  with mild temporary benefit.  History of asthma and COPD, states she has been more short of breath than usual recently and coughing has caused exacerbation of the back pain.  Has been compliant with her inhaler and nebulizer regimen at home.    Past Medical History:  Diagnosis Date   Allergic rhinitis    Anxiety    Asthma    B12 deficiency 01/18/2015   Cataract    Cavitary lung disease    Chronic sinusitis    COPD (chronic obstructive pulmonary disease) (HCC)    Depression    Diffuse large B-cell lymphoma (HCC)    DVT (deep venous thrombosis) (HCC) 09/25/2014   Essential hypertension    GERD (gastroesophageal reflux disease)    Hematuria 04/07/2016   History of pneumonia 01/2013   Hyperlipidemia    Hypogammaglobulinemia, acquired    Iron  deficiency anemia 01/18/2015   Myocardial infarction (HCC) 2011   Narcolepsy without cataplexy(347.00) 01/18/2015   Orofacial dyskinesia 04/22/2015   Osteoporosis    Peripheral neuropathy due to chemotherapy 05/02/2016   Sleep apnea    SVC syndrome 09/25/2014    Patient Active Problem List   Diagnosis Date Noted   Valvular heart disease 07/31/2023   Low back pain 11/10/2020   Hx of colonic polyps 12/13/2017   Peripheral neuropathy due to chemotherapy 05/02/2016   Recurrent herpes labialis 05/02/2016   Osteoporosis 04/30/2015   Narcolepsy without cataplexy 01/18/2015    Iron  deficiency anemia 01/18/2015   B12 deficiency 01/18/2015   SVC syndrome 09/25/2014   Sinusitis, chronic 05/06/2014   Abnormal finding on imaging 02/11/2014   OSA (obstructive sleep apnea) 09/10/2012   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   Hypogammaglobulinemia, acquired 12/30/2009   Non Hodgkin's lymphoma (HCC) 09/09/2009   HLD (hyperlipidemia) 04/21/2007   Essential hypertension 04/21/2007   DIABETES MELLITUS, BORDERLINE 04/21/2007   Moderate persistent asthma 04/03/2007   Allergic rhinitis 10/19/2006   VOCAL CORD DISORDER 10/19/2006   Asthmatic bronchitis , chronic (HCC) 10/19/2006   GERD 10/19/2006   GASTRIC POLYP 07/25/2006    Past Surgical History:  Procedure Laterality Date   ABDOMINAL HYSTERECTOMY     Fibroids   BASAL CELL CARCINOMA EXCISION  03/2011   scalp   BREAST SURGERY Right    biopsy   CATARACT EXTRACTION W/PHACO Left 11/17/2014   Procedure: CATARACT EXTRACTION PHACO AND INTRAOCULAR LENS PLACEMENT LEFT EYE;  Surgeon: Cherene Mania, MD;  Location: [facility];  Service: Ophthalmology;  Laterality: Left;  CDE:5.60   CATARACT EXTRACTION W/PHACO Right 12/15/2014   Procedure: CATARACT EXTRACTION PHACO AND INTRAOCULAR LENS PLACEMENT RIGHT EYE CDE=5.16;  Surgeon: Cherene Mania, MD;  Location: [facility];  Service: Ophthalmology;  Laterality: Right;   COLONOSCOPY N/A 11/14/2012   Procedure: COLONOSCOPY;  Surgeon: Claudis RAYMOND Rivet, MD;  Location: [facility];  Service: Endoscopy;  Laterality: N/A;  830   COLONOSCOPY WITH  PROPOFOL  N/A 01/12/2018   Procedure: COLONOSCOPY WITH PROPOFOL ;  Surgeon: Golda Claudis PENNER, MD;  Location: [facility];  Service: Endoscopy;  Laterality: N/A;  7:30   EYE SURGERY     NASAL SINUS SURGERY     NECK SURGERY     x 2    PERIPHERALLY INSERTED CENTRAL CATHETER INSERTION     PICC Removal     POLYPECTOMY  01/12/2018   Procedure: POLYPECTOMY;  Surgeon: Golda Claudis PENNER, MD;  Location: [facility];  Service: Endoscopy;;  colon    PORT-A-CATH REMOVAL     PORTACATH PLACEMENT  7/11   Removed 6/12   TRACHEOSTOMY     VESICOVAGINAL FISTULA CLOSURE W/ TAH      OB History     Gravida  2   Para  2   Term  2   Preterm      AB      Living  3      SAB      IAB      Ectopic      Multiple  1   Live Births  3            Home Medications    Prior to Admission medications   Medication Sig Start Date End Date Taking? Authorizing Provider  predniSONE  (DELTASONE ) 20 MG tablet Take 2 tablets (40 mg total) by mouth daily with breakfast. 11/18/23  Yes Stuart Vernell Norris, PA-C  tiZANidine (ZANAFLEX) 4 MG tablet Take 1 tablet (4 mg total) by mouth every 8 (eight) hours as needed for muscle spasms. Do not drink alcohol or drive while taking this medication.  May cause drowsiness. 11/18/23  Yes Stuart Vernell Norris, PA-C  acetaminophen  (TYLENOL ) 500 MG tablet Take 1,000 mg by mouth every 6 (six) hours as needed for moderate pain or headache.     [provider]  acyclovir  (ZOVIRAX ) 400 MG tablet TAKE ONE TABLET BY MOUTH TWICE DAILY. 03/30/23   Rogers Hai, MD  albuterol  (PROAIR  HFA) 108 (90 Base) MCG/ACT inhaler Inhale 2 puffs into the lungs every 4 (four) hours as needed. 07/31/23   Darlean Ozell NOVAK, MD  albuterol  (PROVENTIL ) (2.5 MG/3ML) 0.083% nebulizer solution Take 2.5 mg by nebulization every 4 (four) hours as needed for wheezing or shortness of breath.    [provider]  amphetamine-dextroamphetamine (ADDERALL) 10 MG tablet Take 10 mg by mouth every morning.  04/13/15   [provider]  Armodafinil 150 MG tablet Take 150 mg by mouth every morning.     [provider]  aspirin EC 81 MG tablet Take 81 mg by mouth daily. Swallow whole.    [provider]  budesonide -glycopyrrolate -formoterol  (BREZTRI AEROSPHERE) 160-9-4.8 MCG/ACT AERO inhaler Take 2 puffs first thing in am and then another 2 puffs about 12 hours later. 10/24/23   Darlean Ozell NOVAK, MD   celecoxib (CELEBREX) 200 MG capsule Take 200 mg by mouth 2 (two) times daily as needed. 10/08/20   [provider]  citalopram  (CELEXA ) 10 MG tablet Take 10 mg by mouth daily. 07/07/21   [provider]  cyanocobalamin  1000 MCG tablet Take 1,000 mcg by mouth daily.    [provider]  gabapentin  (NEURONTIN ) 600 MG tablet 300 mg 2 (two) times daily.  06/08/18   [provider]  mupirocin  ointment (BACTROBAN ) 2 % SMARTSIG:sparingly Topical 3 Times Daily Patient not taking: Reported on 10/24/2023 08/23/23   [provider]  olmesartan (BENICAR) 20 MG tablet Take 20 mg  by mouth daily. 07/07/21   [provider]  omeprazole  (PRILOSEC) 40 MG capsule Take 40 mg by mouth daily. 02/26/22   [provider]  predniSONE  (DELTASONE ) 10 MG tablet Take  4 each am x 2 days,   2 each am x 2 days,  1 each am x 2 days and stop 10/24/23   Wert, Michael B, MD  simvastatin  (ZOCOR ) 40 MG tablet Take 40 mg by mouth daily. 07/07/21   [provider]  zafirlukast  (ACCOLATE ) 20 MG tablet TAKE 1 TABLET BY MOUTH TWICE DAILY. 09/19/18   Shelah Lamar RAMAN, MD    Family History Family History  Problem Relation Age of Onset   Emphysema Mother    Stroke Mother    COPD Mother    Heart disease Mother        died in sleep  27   Allergies Father    Asthma Father        as a child   Arthritis Father    Parkinson's disease Father 62   Leukemia Maternal Grandmother    Cancer Maternal Grandmother        Leukemia   Diabetes Brother    Heart attack Brother    Hypertension Brother    Heart disease Brother 40       stents   Hypertension Sister    Hypertension Brother     Social History Social History   Tobacco Use   Smoking status: Never   Smokeless tobacco: Never  Vaping Use   Vaping status: Never Used  Substance Use Topics   Alcohol use: No   Drug use: No     Allergies   Meperidine  hcl and Montelukast sodium   Review of Systems Review of  Systems Per HPI  Physical Exam Triage Vital Signs ED Triage Vitals  Encounter Vitals Group     BP 11/18/23 1137 121/74     Girls Systolic BP Percentile --      Girls Diastolic BP Percentile --      Boys Systolic BP Percentile --      Boys Diastolic BP Percentile --      Pulse Rate 11/18/23 1137 97     Resp 11/18/23 1137 20     Temp 11/18/23 1137 98.8 F (37.1 C)     Temp Source 11/18/23 1137 Oral     SpO2 11/18/23 1137 (!) 87 %     Weight --      Height --      Head Circumference --      Peak Flow --      Pain Score 11/18/23 1141 9     Pain Loc --      Pain Education --      Exclude from Growth Chart --    No data found.  Updated Vital Signs BP 121/74 (BP Location: Right Arm)   Pulse 84   Temp 98.8 F (37.1 C) (Oral)   Resp 20   SpO2 99% Comment: post neb O2 sat  Visual Acuity Right Eye Distance:   Left Eye Distance:   Bilateral Distance:    Right Eye Near:   Left Eye Near:    Bilateral Near:     Physical Exam Vitals and nursing note reviewed.  Constitutional:      Appearance: Normal appearance. She is not ill-appearing.  HENT:     Head: Atraumatic.     Mouth/Throat:     Mouth: Mucous membranes are moist.  Eyes:  Extraocular Movements: Extraocular movements intact.     Conjunctiva/sclera: Conjunctivae normal.  Cardiovascular:     Rate and Rhythm: Normal rate and regular rhythm.     Heart sounds: Normal heart sounds.  Pulmonary:     Effort: Pulmonary effort is normal. No respiratory distress.     Breath sounds: Wheezing present. No rales.  Musculoskeletal:        General: Tenderness present. No swelling. Normal range of motion.     Cervical back: Normal range of motion and neck supple.     Comments: No midline spinal tenderness to palpation diffusely.  Normal gait and range of motion, negative straight leg raise bilateral lower extremities.  Tenderness to palpation bilateral thoracic musculature  Skin:    General: Skin is warm and dry.   Neurological:     Mental Status: She is alert and oriented to person, place, and time.     Comments: All 4 extremities neurovascular intact  Psychiatric:        Mood and Affect: Mood normal.        Thought Content: Thought content normal.        Judgment: Judgment normal.      UC Treatments / Results  Labs (all labs ordered are listed, but only abnormal results are displayed) Labs Reviewed - No data to display  EKG   Radiology DG Chest 2 View Result Date: 11/18/2023 CLINICAL DATA:  wheezing, low o2 sats EXAM: CHEST - 2 VIEW COMPARISON:  July 04, 2019 FINDINGS: The cardiomediastinal silhouette is unchanged in contour.Atherosclerotic calcifications. No pleural effusion. No pneumothorax. Possible nodular opacities at bilateral apices. Similar appearance of diffuse bronchial wall thickening with flattening of the hemidiaphragms likely reflecting underlying COPD/emphysema. Visualized abdomen is unremarkable. Multilevel degenerative changes of the thoracic spine. IMPRESSION: 1. Possible nodular opacities at bilateral apices. Recommend further evaluation with dedicated chest CT versus follow-up PA and lateral chest radiograph in 4-6 weeks. 2. Background of chronic bronchitis/possible COPD. Electronically Signed   By: Corean Salter M.D.   On: 11/18/2023 12:13    Procedures Procedures (including critical care time)  Medications Ordered in UC Medications  albuterol  (PROVENTIL ) (2.5 MG/3ML) 0.083% nebulizer solution 2.5 mg (2.5 mg Nebulization Given 11/18/23 1158)    Initial Impression / Assessment and Plan / UC Course  I have reviewed the triage vital signs and the nursing notes.  Pertinent labs & imaging results that were available during my care of the patient were reviewed by me and considered in my medical decision making (see chart for details).     Vital signs and exam overall reassuring.  Oxygen saturation upon triage was around 88% on room air so chest x-ray and albuterol   nebulizer were performed.  Oxygen post nebulizer treatment on room air was 99% with good symptomatic improvement.  Chest x-ray negative for acute cardiopulmonary abnormality, showing chronic bronchitis changes and chronic pulmonary nodules.  Will treat for COPD exacerbation with prednisone , continued inhaler and nebulizer regimen and follow-up with PCP for recheck next week.  Suspect back pain related to muscular strain.  In addition to the prednisone  being given for her respiratory issues which should help the back pain, we will also treat with Zanaflex, heat,/, stretches, rest.  Return for worsening or unresolving symptoms.  Final Clinical Impressions(s) / UC Diagnoses   Final diagnoses:  COPD exacerbation (HCC)  Thoracic myofascial strain, initial encounter   Discharge Instructions   None    ED Prescriptions     Medication Sig Dispense Auth. Provider  predniSONE  (DELTASONE ) 20 MG tablet Take 2 tablets (40 mg total) by mouth daily with breakfast. 10 tablet Stuart Vernell Norris, PA-C   tiZANidine (ZANAFLEX) 4 MG tablet Take 1 tablet (4 mg total) by mouth every 8 (eight) hours as needed for muscle spasms. Do not drink alcohol or drive while taking this medication.  May cause drowsiness. 15 tablet Stuart Vernell Norris, [place]      PDMP not reviewed this encounter.   Stuart Vernell Norris, [place] 11/18/23 1408

## 2023-11-18 NOTE — ED Triage Notes (Signed)
 Pt lower back pain since Sunday, now has pain in the front, having difficulty with deep breathing because of the pain.

## 2023-11-20 ENCOUNTER — Inpatient Hospital Stay: Attending: Hematology

## 2023-11-20 DIAGNOSIS — M549 Dorsalgia, unspecified: Secondary | ICD-10-CM | POA: Diagnosis not present

## 2023-11-20 DIAGNOSIS — D509 Iron deficiency anemia, unspecified: Secondary | ICD-10-CM | POA: Insufficient documentation

## 2023-11-20 DIAGNOSIS — D508 Other iron deficiency anemias: Secondary | ICD-10-CM

## 2023-11-20 DIAGNOSIS — D801 Nonfamilial hypogammaglobulinemia: Secondary | ICD-10-CM | POA: Insufficient documentation

## 2023-11-20 DIAGNOSIS — C8338 Diffuse large B-cell lymphoma, lymph nodes of multiple sites: Secondary | ICD-10-CM

## 2023-11-20 DIAGNOSIS — C83398 Diffuse large b-cell lymphoma of other extranodal and solid organ sites: Secondary | ICD-10-CM | POA: Insufficient documentation

## 2023-11-20 LAB — CBC WITH DIFFERENTIAL/PLATELET
Abs Immature Granulocytes: 0.01 K/uL (ref 0.00–0.07)
Basophils Absolute: 0 K/uL (ref 0.0–0.1)
Basophils Relative: 0 %
Eosinophils Absolute: 0 K/uL (ref 0.0–0.5)
Eosinophils Relative: 0 %
HCT: 37.1 % (ref 36.0–46.0)
Hemoglobin: 11.9 g/dL — ABNORMAL LOW (ref 12.0–15.0)
Immature Granulocytes: 0 %
Lymphocytes Relative: 24 %
Lymphs Abs: 2.3 K/uL (ref 0.7–4.0)
MCH: 31.4 pg (ref 26.0–34.0)
MCHC: 32.1 g/dL (ref 30.0–36.0)
MCV: 97.9 fL (ref 80.0–100.0)
Monocytes Absolute: 0.6 K/uL (ref 0.1–1.0)
Monocytes Relative: 7 %
Neutro Abs: 6.4 K/uL (ref 1.7–7.7)
Neutrophils Relative %: 69 %
Platelets: 219 K/uL (ref 150–400)
RBC: 3.79 MIL/uL — ABNORMAL LOW (ref 3.87–5.11)
RDW: 12.5 % (ref 11.5–15.5)
WBC: 9.3 K/uL (ref 4.0–10.5)
nRBC: 0 % (ref 0.0–0.2)

## 2023-11-20 LAB — COMPREHENSIVE METABOLIC PANEL WITH GFR
ALT: 10 U/L (ref 0–44)
AST: 18 U/L (ref 15–41)
Albumin: 4 g/dL (ref 3.5–5.0)
Alkaline Phosphatase: 108 U/L (ref 38–126)
Anion gap: 13 (ref 5–15)
BUN: 24 mg/dL — ABNORMAL HIGH (ref 8–23)
CO2: 26 mmol/L (ref 22–32)
Calcium: 9.6 mg/dL (ref 8.9–10.3)
Chloride: 101 mmol/L (ref 98–111)
Creatinine, Ser: 1.15 mg/dL — ABNORMAL HIGH (ref 0.44–1.00)
GFR, Estimated: 52 mL/min — ABNORMAL LOW (ref >60)
Glucose, Bld: 129 mg/dL — ABNORMAL HIGH (ref 70–99)
Potassium: 4.4 mmol/L (ref 3.5–5.1)
Sodium: 139 mmol/L (ref 135–145)
Total Bilirubin: 0.4 mg/dL (ref 0.0–1.2)
Total Protein: 7.6 g/dL (ref 6.5–8.1)

## 2023-11-20 LAB — LACTATE DEHYDROGENASE: LDH: 177 U/L (ref 105–235)

## 2023-11-20 LAB — MAGNESIUM: Magnesium: 2.1 mg/dL (ref 1.7–2.4)

## 2023-11-20 LAB — FERRITIN: Ferritin: 357 ng/mL — ABNORMAL HIGH (ref 11–307)

## 2023-11-21 ENCOUNTER — Inpatient Hospital Stay

## 2023-11-21 ENCOUNTER — Inpatient Hospital Stay: Admitting: Oncology

## 2023-11-21 VITALS — BP 133/74 | HR 62 | Temp 97.4°F | Resp 18 | Wt 123.4 lb

## 2023-11-21 VITALS — BP 140/72 | HR 64 | Temp 97.5°F | Resp 16

## 2023-11-21 DIAGNOSIS — D801 Nonfamilial hypogammaglobulinemia: Secondary | ICD-10-CM

## 2023-11-21 DIAGNOSIS — R9389 Abnormal findings on diagnostic imaging of other specified body structures: Secondary | ICD-10-CM | POA: Diagnosis not present

## 2023-11-21 DIAGNOSIS — D500 Iron deficiency anemia secondary to blood loss (chronic): Secondary | ICD-10-CM

## 2023-11-21 LAB — IGG, IGA, IGM
IgA: <5 mg/dL — ABNORMAL LOW (ref 87–352)
IgG (Immunoglobin G), Serum: 1842 mg/dL — ABNORMAL HIGH (ref 586–1602)
IgM (Immunoglobulin M), Srm: 124 mg/dL (ref 26–217)

## 2023-11-21 MED ORDER — DEXTROSE 5 % IV SOLN: INTRAVENOUS | Status: DC

## 2023-11-21 MED ORDER — IMMUNE GLOBULIN (HUMAN) 10 GM/100ML IV SOLN
25.0000 g | Freq: Once | INTRAVENOUS | Status: AC
  Administered 2023-11-21: 25 g via INTRAVENOUS
  Filled 2023-11-21: qty 200

## 2023-11-21 NOTE — Assessment & Plan Note (Addendum)
-   Hemoglobin is normal at 11.9.  Last ferritin is 357. -Will add on iron  panel at her next visit.

## 2023-11-21 NOTE — Assessment & Plan Note (Addendum)
-   She is receiving IVIG every 12 weeks. - No new or recent infections. - We reviewed labs from 11/17?25: Hemoglobin 11.9; the rest of CBC grossly normal.  LDH normal.  Immunoglobulins pending during dictation.  CMP shows mild elevated creatinine 1.15 glucose but otherwise normal.  Ferritin elevated. -Continue IVIG every 12 weeks as her trough levels were elevated.  Repeat immunoglobulin levels every other visit. -No increase in infections. No upper respiratory type infections.  Per Dr. Charmain last note, she may resume with every 12-week IVIG infusions verse every 8.  Will recheck all of her labs including her IgM levels at her next visit. -Proceed with IVIG today.

## 2023-11-21 NOTE — Progress Notes (Signed)
 [name] OFFICE PROGRESS NOTE  Marvine Rush, MD  ASSESSMENT & PLAN:  Assessment & Plan Hypogammaglobulinemia, acquired - She is receiving IVIG every 12 weeks. - No new or recent infections. - We reviewed labs from 11/17?25: Hemoglobin 11.9; the rest of CBC grossly normal.  LDH normal.  Immunoglobulins pending during dictation.  CMP shows mild elevated creatinine 1.15 glucose but otherwise normal.  Ferritin elevated. -Continue IVIG every 12 weeks as her trough levels were elevated.  Repeat immunoglobulin levels every other visit. -No increase in infections. No upper respiratory type infections.  Per Dr. Charmain last note, she may resume with every 12-week IVIG infusions verse every 8.  Will recheck all of her labs including her IgM levels at her next visit. -Proceed with IVIG today. Iron  deficiency anemia due to chronic blood loss - Hemoglobin is normal at 11.9.  Last ferritin is 357. -Will add on iron  panel at her next visit.  Abnormal chest x-ray Patient reports abnormal chest x-ray on 11/18/2023.  She presented to urgent care for back pain which she thought was secondary to blowing leaves on the weekend and chest x-ray showed possible nodular opacities at bilateral apices.  Recommend further evaluation with dedicated chest CT versus follow-up with chest x-ray in 4 to 6 weeks.  Patient is a former smoker and has history of COPD.  Recommend follow-up in 6 to 8 weeks with CT scan.  She was agreeable.  Will get this ordered today.   Orders Placed This Encounter  Procedures   CT CHEST W CONTRAST    Standing Status:   Future    Expected Date:   01/02/2024    Expiration Date:   11/20/2024    If indicated for the ordered procedure, I authorize the administration of contrast media per Radiology protocol:   Yes    Preferred imaging location?:   [facility]    Radiology Contrast Protocol - do NOT remove file path:    [email or web address]   CBC with Differential    Standing Status:   Future    Expected Date:   02/21/2024    Expiration Date:   05/21/2024   Magnesium    Standing Status:   Future    Expected Date:   02/21/2024    Expiration Date:   05/21/2024   Lactate dehydrogenase    Standing Status:   Future    Expected Date:   02/21/2024    Expiration Date:   05/21/2024   IgG, IgA, IgM    Standing Status:   Future    Expected Date:   02/21/2024    Expiration Date:   05/21/2024   Comprehensive metabolic panel    Standing Status:   Future    Expected Date:   02/21/2024    Expiration Date:   05/21/2024   Ferritin    Standing Status:   Future    Expected Date:   02/21/2024    Expiration Date:   05/21/2024    INTERVAL HISTORY: Patient returns for follow-up.  Continues to receive IVIG every 12 weeks last on 08/29/2023.  Tolerating well.  IVIG was switched from every 8 weeks to 12 weeks due to elevated IgG levels.  Patient was recently evaluated at urgent care for back pain prompting imaging which showed possible nodular opacities at bilateral apices.  Recommend further evaluation with dedicated chest CT versus follow-up with x-rays in 4 to 6 weeks.  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  That has since improved.  Feels like she may have overdone it.  She is a former smoker and has concerns about the nodules that were discovered during recent imaging.  Reports appetite and energy levels are 75%.  Has 8 out of 10 back pain.  Has cough and shortness of breath secondary to asthma.  Has chest pain and heart murmur.  Has sleep problems due to back pain.  No recurrent infections.   We reviewed cbc, cmp, ldh, mag and chest x-ray.  SUMMARY OF HEMATOLOGIC HISTORY: Oncology History  Non Hodgkin's lymphoma (HCC)  09/09/2009 Initial Diagnosis   Non Hodgkin's lymphoma (HCC)     1. Acquired hypogammaglobulinemia: -She has  been receiving IVIG every 8 weeks since January 2020. -She does have severely low IgA levels.  However he does not have any reactions.   2.  DLBCL, stage IVb: -She was diagnosed 07/08/2009.  Treated with 4 cycles of R-CHOP with intrathecal methotrexate during cycle 2 and 4.  She developed a Pseudomonas sepsis after cycles 2 and 4 and therapy was stopped after 4 cycles.  She also received 2 years of maintenance rituximab . -Last PET scan on 03/20/2017 did not show any evidence of lymphoma.   3.  Health maintenance: -Mammogram on 05/12/22, BI-RADS Category 1.   CBC    Component Value Date/Time   WBC 9.3 11/20/2023 0735   RBC 3.79 (L) 11/20/2023 0735   HGB 11.9 (L) 11/20/2023 0735   HCT 37.1 11/20/2023 0735   PLT 219 11/20/2023 0735   MCV 97.9 11/20/2023 0735   MCH 31.4 11/20/2023 0735   MCHC 32.1 11/20/2023 0735   RDW 12.5 11/20/2023 0735   LYMPHSABS 2.3 11/20/2023 0735   MONOABS 0.6 11/20/2023 0735   EOSABS 0.0 11/20/2023 0735   BASOSABS 0.0 11/20/2023 0735       Latest Ref Rng & Units 11/20/2023    7:35 AM 08/22/2023    7:43 AM 05/25/2023    8:45 AM  CMP  Glucose 70 - 99 mg/dL 870  885  77   BUN 8 - 23 mg/dL 24  16  16    Creatinine 0.44 - 1.00 mg/dL 8.84  8.93  9.03   Sodium 135 - 145 mmol/L 139  139  137   Potassium 3.5 - 5.1 mmol/L 4.4  4.3  3.8   Chloride 98 - 111 mmol/L 101  103  101   CO2 22 - 32 mmol/L 26  28  29    Calcium  8.9 - 10.3 mg/dL 9.6  9.0  8.9   Total Protein 6.5 - 8.1 g/dL 7.6  7.3  7.3   Total Bilirubin 0.0 - 1.2 mg/dL 0.4  0.9  0.6   Alkaline Phos 38 - 126 U/L 108  94  103   AST 15 - 41 U/L 18  17  19    ALT 0 - 44 U/L 10  11  11       Lab Results  Component Value Date   FERRITIN 357 (H) 11/20/2023   VITAMINB12 1,112 (H) 10/07/2019    Vitals:   11/21/23 1004  BP: 133/74  Pulse: 62  Resp: 18  Temp: (!) 97.4 F (36.3 C)  SpO2: 100%    Review of System:  Review of Systems  Constitutional:  Positive for malaise/fatigue.  Respiratory:  Positive  for cough and shortness of breath.   Cardiovascular:  Positive for chest pain.  Musculoskeletal:  Positive for back pain.  Psychiatric/Behavioral:  The  patient has insomnia.     Physical Exam: Physical Exam Constitutional:      Appearance: Normal appearance.  HENT:     Head: Normocephalic and atraumatic.  Eyes:     Pupils: Pupils are equal, round, and reactive to light.  Cardiovascular:     Rate and Rhythm: Normal rate and regular rhythm.     Heart sounds: Normal heart sounds. No murmur heard. Pulmonary:     Effort: Pulmonary effort is normal.     Breath sounds: Normal breath sounds. No wheezing.  Abdominal:     General: Bowel sounds are normal. There is no distension.     Palpations: Abdomen is soft.     Tenderness: There is no abdominal tenderness.  Musculoskeletal:        General: Normal range of motion.     Cervical back: Normal range of motion.  Skin:    General: Skin is warm and dry.     Findings: No rash.  Neurological:     Mental Status: She is alert and oriented to person, place, and time.     Gait: Gait is intact.  Psychiatric:        Mood and Affect: Mood and affect normal.        Cognition and Memory: Memory normal.        Judgment: Judgment normal.      I spent 20 minutes dedicated to the care of this patient (face-to-face and non-face-to-face) on the date of the encounter to include what is described in the assessment and plan.,  Delon Hope, NP 11/21/2023 12:28 PM

## 2023-11-21 NOTE — Patient Instructions (Signed)
 [facility] [place] - A DEPT OF [facility]  Discharge Instructions: Thank you for choosing [facility] to provide your oncology and hematology care.  If you have a lab appointment with the Cancer Center - please note that after April 8th, 2024, all labs will be drawn in the cancer center.  You do not have to check in or register with the main entrance as you have in the past but will complete your check-in in the cancer center.  Wear comfortable clothing and clothing appropriate for easy access to any Portacath or PICC line.   We strive to give you quality time with your provider. You may need to reschedule your appointment if you arrive late (15 or more minutes).  Arriving late affects you and other patients whose appointments are after yours.  Also, if you miss three or more appointments without notifying the office, you may be dismissed from the clinic at the provider's discretion.      For prescription refill requests, have your pharmacy contact our office and allow 72 hours for refills to be completed.    Today you received the following chemotherapy and/or immunotherapy agents IVIG      To help prevent nausea and vomiting after your treatment, we encourage you to take your nausea medication as directed.  BELOW ARE SYMPTOMS THAT SHOULD BE REPORTED IMMEDIATELY: *FEVER GREATER THAN 100.4 F (38 C) OR HIGHER *CHILLS OR SWEATING *NAUSEA AND VOMITING THAT IS NOT CONTROLLED WITH YOUR NAUSEA MEDICATION *UNUSUAL SHORTNESS OF BREATH *UNUSUAL BRUISING OR BLEEDING *URINARY PROBLEMS (pain or burning when urinating, or frequent urination) *BOWEL PROBLEMS (unusual diarrhea, constipation, pain near the anus) TENDERNESS IN MOUTH AND THROAT WITH OR WITHOUT PRESENCE OF ULCERS (sore throat, sores in mouth, or a toothache) UNUSUAL RASH, SWELLING OR PAIN  UNUSUAL VAGINAL DISCHARGE OR ITCHING   Items with * indicate a potential emergency and should be followed up as  soon as possible or go to the Emergency Department if any problems should occur.  Please show the CHEMOTHERAPY ALERT CARD or IMMUNOTHERAPY ALERT CARD at check-in to the Emergency Department and triage nurse.  Should you have questions after your visit or need to cancel or reschedule your appointment, please contact [facility] [place] - A DEPT OF [facility] [phone]  and follow the prompts.  Office hours are 8:00 a.m. to 4:30 p.m. Monday - Friday. Please note that voicemails left after 4:00 p.m. may not be returned until the following business day.  We are closed weekends and major holidays. You have access to a nurse at all times for urgent questions. Please call the main number to the clinic [phone] and follow the prompts.  For any non-urgent questions, you may also contact your provider using MyChart. We now offer e-Visits for anyone 47 and older to request care online for non-urgent symptoms. For details visit [email or web address].   Also download the MyChart app! Go to the app store, search "MyChart", open the app, select [place], and log in with your MyChart username and password.

## 2023-11-21 NOTE — Assessment & Plan Note (Deleted)
-   Hemoglobin is normal at 12.1.  Last ferritin is 171 saturation 30

## 2023-11-21 NOTE — Progress Notes (Signed)
 Patient takes her pre-medications at home.   Treatment given per orders. Patient tolerated it well without problems. Vitals stable and discharged home from clinic ambulatory. Follow up as scheduled.

## 2023-12-27 ENCOUNTER — Other Ambulatory Visit (HOSPITAL_COMMUNITY): Payer: Self-pay | Admitting: Neurosurgery

## 2023-12-27 DIAGNOSIS — S22040A Wedge compression fracture of fourth thoracic vertebra, initial encounter for closed fracture: Secondary | ICD-10-CM

## 2024-01-01 ENCOUNTER — Encounter: Payer: Self-pay | Admitting: *Deleted

## 2024-01-09 ENCOUNTER — Ambulatory Visit (HOSPITAL_COMMUNITY)
Admission: RE | Admit: 2024-01-09 | Discharge: 2024-01-09 | Disposition: A | Discharge status: Home / Self Care | Source: Ambulatory Visit | Attending: Oncology | Admitting: Oncology

## 2024-01-09 ENCOUNTER — Ambulatory Visit (HOSPITAL_COMMUNITY)
Admission: RE | Admit: 2024-01-09 | Discharge: 2024-01-09 | Disposition: A | Discharge status: Home / Self Care | Source: Ambulatory Visit | Attending: Neurosurgery | Admitting: Neurosurgery

## 2024-01-09 DIAGNOSIS — S22050A Wedge compression fracture of T5-T6 vertebra, initial encounter for closed fracture: Secondary | ICD-10-CM

## 2024-01-09 DIAGNOSIS — R9389 Abnormal findings on diagnostic imaging of other specified body structures: Secondary | ICD-10-CM | POA: Diagnosis present

## 2024-01-09 DIAGNOSIS — D500 Iron deficiency anemia secondary to blood loss (chronic): Secondary | ICD-10-CM | POA: Diagnosis present

## 2024-01-09 DIAGNOSIS — S22040A Wedge compression fracture of fourth thoracic vertebra, initial encounter for closed fracture: Secondary | ICD-10-CM | POA: Insufficient documentation

## 2024-01-09 MED ORDER — IOHEXOL 300 MG/ML  SOLN
80.0000 mL | Freq: Once | INTRAMUSCULAR | Status: AC | PRN
  Administered 2024-01-09: 80 mL via INTRAVENOUS

## 2024-01-19 ENCOUNTER — Ambulatory Visit: Payer: Self-pay | Admitting: Oncology

## 2024-01-19 NOTE — Progress Notes (Signed)
 Good morning, can you call this patient and let her know that her CT scan showed mild bronchiectasis with scattered mucoid impaction which can be seen with chronic Mycobacterium avium complex.  I think she needs to be seen by pulmonology.  Lets refer her and have them see her for this.  She may require antibiotics but I am not super familiar with this.  Let me know which she says.  Delon Hope, AGNP-C Department of Hematology/Oncology [facility] at [facility]  Phone: [phone]  01/19/2024 8:06 AM

## 2024-01-22 NOTE — Progress Notes (Signed)
 Patient aware and will follow up with Dr. Darlean this week.

## 2024-01-24 NOTE — Progress Notes (Signed)
 JUDEEN Delon Hope, AGNP-C Department of Hematology/Oncology [facility] at [facility]  Phone: [phone]  01/24/2024 3:37 PM

## 2024-01-25 ENCOUNTER — Ambulatory Visit: Admitting: Internal Medicine

## 2024-01-25 ENCOUNTER — Encounter: Payer: Self-pay | Admitting: Internal Medicine

## 2024-01-25 VITALS — BP 108/69 | HR 90 | Ht 60.0 in | Wt 122.0 lb

## 2024-01-25 DIAGNOSIS — J309 Allergic rhinitis, unspecified: Secondary | ICD-10-CM

## 2024-01-25 DIAGNOSIS — J4489 Other specified chronic obstructive pulmonary disease: Secondary | ICD-10-CM | POA: Diagnosis not present

## 2024-01-25 MED ORDER — DOXYCYCLINE HYCLATE 100 MG PO TABS: 100.0000 mg | ORAL_TABLET | Freq: Two times a day (BID) | ORAL | 11 refills | Status: AC

## 2024-01-25 MED ORDER — BUDESONIDE-FORMOTEROL FUMARATE 80-4.5 MCG/ACT IN AERO: INHALATION_SPRAY | RESPIRATORY_TRACT | 12 refills | Status: AC

## 2024-01-25 NOTE — Assessment & Plan Note (Signed)
 Worse rhinitis sometimes purulent since summer of 2025  - referred back to [facility] 01/25/2024

## 2024-01-25 NOTE — Progress Notes (Unsigned)
 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)  assoc with chronic sinusitis s/p Shoemaker surgery prior to 2010   NH lymphoma chemo but no RT last rx 2012 > f/u immunoglobulin shots per K q 2 months   Pt not  seen by PCCM service since 2020 [place] dx Asthma   History of Present Illness  07/31/2023  Pulmonary/ 1st office eval/ Darlean / [place] [facility]  Chief Complaint  Patient presents with   Establish Care  Dyspnea:  100 ft to garden some of it uphill stops half way most times/ walking at [employer] faster than other  Cough: sporadic/ no am or noct flare> minimal yellowish assoc with chronic sinusitis s/p 2 sinus surgeries last in 2011  Sleep: bed is flat is wedge pillow  SABA use: rare HFA / only does Neb during flares few and far between 02: none  Rec Plan A = Automatic = Always=    Symbicort  80 Take 2 puffs first thing in am and then another 2 puffs about 12 hours later.  Work on inhaler technique:    Plan B = Backup (to supplement plan A, not to replace it) Use your albuterol  inhaler as a rescue medication  Plan C = Crisis (instead of Plan B but only if Plan B stops working) - only use your albuterol  nebulizer if you first try Plan B Also  Ok to try albuterol  15 min before an activity (on alternating days)  that you know would usually make you short of breath  Cxr no acute findings   PFT's  10/24/2023   FEV1 1.08 (55 % ) ratio 0.62  p 14 % improvement from saba p 0 prior to study  and FV curve moderate concavity    10/24/2023  f/u ov/[place] office/Sidi Dzikowski re: severe chronic asthma / valvular ht dz  maint on symbicort   80 on IV IG  q  2 months due to chemo  Chief Complaint  Patient presents with   Asthma    Pft 10/21 Cough sometimes  Dyspnea:   improved activity tolerance since started symbicort   Cough: yellowish mucus sporadic perfume makes it worse Sleeping: flat bed and wedge pillow s resp cc  SABA use: not much  02: none  Patient Instructions  If get worse >  Prednisone  10 mg take  4 each am x 2 days,   2 each am x 2 days,  1 each am x 2 days and stop  Change to  Breztri   to 160 Take 2 puffs first thing in am and then another 2 puffs about 12 hours later.  Please schedule a follow up visit in 6  months but call sooner if needed - call in meantime if you would like to see Dr Iva about an infusion    CT chest with contrast  01/09/24  1. Mild bronchiectasis, scattered mucoid impaction, peribronchial thickening and peribronchovascular nodularity/ground-glass, findings which can be seen with chronic mycobacterium avium complex. The possibility of repeated bouts of aspiration in the lower lobes is also considered. 2. T5 and T6 compression fractures, considered acute or subacute on today's MR thoracic spine. 3.  Aortic atherosclerosis (ICD10-I70.0).      01/25/2024  f/u ov/[place]  office/Jaramiah Bossard re: severe chronic asthma / valvular ht dz/ new  dx bronchiectasis  maint on breztri    Chief Complaint  Patient presents with   Follow-up    CT 1/13 MRI 1/13 Shob / coughing green a lot wont come up  Dyspnea:  about the same on breztri  / as symbicort   Cough: green this am  Sleeping: flat bed with wedge  a  resp cc  SABA use: saba hfa last week  02: None    Nose running like faucet x 6 months   No obvious day to day or daytime variability or assoc   mucus plugs or hemoptysis or cp or chest tightness, subjective wheeze or overt sinus or hb symptoms.    Also denies any obvious fluctuation of symptoms with weather or environmental changes or other aggravating or alleviating factors except as outlined above   No unusual exposure hx or h/o childhood pna/ asthma or knowledge of premature birth.  Current Allergies, Complete Past  Medical History, Past Surgical History, Family History, and Social History were reviewed in [employer] record.  ROS  The following are not active complaints unless bolded Hoarseness, sore throat, dysphagia, dental problems, itching, sneezing,  nasal congestion or discharge of excess mucus or purulent secretions, ear ache,   fever, chills, sweats, unintended wt loss or wt gain, classically pleuritic or exertional cp,  orthopnea pnd or arm/hand swelling  or leg swelling, presyncope, palpitations, abdominal pain, anorexia, nausea, vomiting, diarrhea  or change in bowel habits or change in bladder habits, change in stools or change in urine, dysuria, hematuria,  rash, arthralgias, visual complaints, headache, numbness, weakness or ataxia or problems with walking or coordination,  change in mood or  memory.         Outpatient Medications Prior to Visit  Medication Sig Dispense Refill   acetaminophen  (TYLENOL ) 500 MG tablet Take 1,000 mg by mouth every 6 (six) hours as needed for moderate pain or headache.      acyclovir  (ZOVIRAX ) 400 MG tablet TAKE ONE TABLET BY MOUTH TWICE DAILY. 60 tablet 6   albuterol  (PROAIR  HFA) 108 (90 Base) MCG/ACT inhaler Inhale 2 puffs into the lungs every 4 (four) hours as needed. 8.5 g 2   albuterol  (PROVENTIL ) (2.5 MG/3ML) 0.083% nebulizer solution Take 2.5 mg by nebulization every 4 (four) hours as needed for wheezing or shortness of breath.     amphetamine-dextroamphetamine (ADDERALL) 10 MG tablet Take 10 mg by mouth every morning.   0   Armodafinil 150 MG tablet Take 150 mg by mouth every morning.      aspirin EC 81 MG tablet Take 81 mg by mouth daily. Swallow whole.     budesonide -glycopyrrolate -formoterol  (BREZTRI  AEROSPHERE) 160-9-4.8 MCG/ACT AERO inhaler Take 2 puffs first thing in am and then another 2 puffs about 12 hours later. 10.7 g 11   celecoxib (CELEBREX) 200 MG capsule Take 200 mg by mouth 2 (two) times daily as needed.     citalopram   (CELEXA ) 10 MG tablet Take 10 mg by mouth daily.     cyanocobalamin  1000 MCG tablet Take 1,000 mcg by mouth daily.     gabapentin  (NEURONTIN ) 600 MG tablet 300 mg 2 (two) times daily.      mupirocin  ointment (BACTROBAN ) 2 % SMARTSIG:sparingly Topical 3 Times Daily     olmesartan (BENICAR) 20 MG tablet Take 20 mg by mouth daily.     omeprazole  (PRILOSEC) 40 MG capsule Take 40 mg by mouth daily.  predniSONE  (DELTASONE ) 10 MG tablet Take  4 each am x 2 days,   2 each am x 2 days,  1 each am x 2 days and stop 14 tablet 0   predniSONE  (DELTASONE ) 20 MG tablet Take 2 tablets (40 mg total) by mouth daily with breakfast. 10 tablet 0   simvastatin  (ZOCOR ) 40 MG tablet Take 40 mg by mouth daily.     tiZANidine  (ZANAFLEX ) 4 MG tablet Take 1 tablet (4 mg total) by mouth every 8 (eight) hours as needed for muscle spasms. Do not drink alcohol or drive while taking this medication.  May cause drowsiness. 15 tablet 0   zafirlukast  (ACCOLATE ) 20 MG tablet TAKE 1 TABLET BY MOUTH TWICE DAILY. 60 tablet 0   Facility-Administered Medications Prior to Visit  Medication Dose Route Frequency Provider Last Rate Last Admin   0.9 %  sodium chloride  infusion   Intravenous Once Kefalas, Thomas S, PA-C       0.9 %  sodium chloride  infusion   Intravenous Once Kefalas, Thomas S, PA-C       acetaminophen  (TYLENOL ) tablet 650 mg  650 mg Oral Q6H PRN Kefalas, Debby RAMAN, PA-C       acetaminophen  (TYLENOL ) tablet 650 mg  650 mg Oral Q6H PRN Kefalas, Thomas S, PA-C       dextrose  5 % solution   Intravenous Continuous Kefalas, Debby RAMAN, PA-C   Stopped at 08/25/15 1400   dextrose  5 % solution   Intravenous Continuous Kefalas, Debby RAMAN, PA-C   Stopped at 07/13/16 1350   dextrose  5 % solution   Intravenous Continuous Letha Truman ORN, NP   Stopped at 02/28/17 1230   diphenhydrAMINE  (BENADRYL ) capsule 25 mg  25 mg Oral Once Kefalas, Thomas S, PA-C       diphenhydrAMINE  (BENADRYL ) capsule 25 mg  25 mg Oral Once Kefalas, Thomas S,  PA-C       sodium chloride  0.9 % injection 10 mL  10 mL Intracatheter PRN Berry Debby RAMAN, PA-C   10 mL at 08/25/15 1125         Past Medical History:  Diagnosis Date   Allergic rhinitis    Anxiety    Asthma    B12 deficiency 01/18/2015   Cataract    Cavitary lung disease    Chronic sinusitis    COPD (chronic obstructive pulmonary disease) (HCC)    Depression    Diffuse large B-cell lymphoma (HCC)    DVT (deep venous thrombosis) (HCC) 09/25/2014   Essential hypertension    GERD (gastroesophageal reflux disease)    Hematuria 04/07/2016   History of pneumonia 01/2013   Hyperlipidemia    Hypogammaglobulinemia, acquired (HCC)    Iron  deficiency anemia 01/18/2015   Myocardial infarction (HCC) 2011   Narcolepsy without cataplexy(347.00) 01/18/2015   Orofacial dyskinesia 04/22/2015   Osteoporosis    Peripheral neuropathy due to chemotherapy (HCC) 05/02/2016   Sleep apnea    SVC syndrome 09/25/2014      Objective:   Wts   01/25/2024       122  10/24/23 127 lb 6.4 oz (57.8 kg)  08/29/23 123 lb 6.4 oz (56 kg)  07/31/23 122 lb 3.2 oz (55.4 kg)      Vital signs reviewed  01/25/2024  - Note at rest 02 sats  88% on RA   General appearance:    amb somber wf / very congested rattling cough      HEENT : Oropharynx  clear  Nasal turbinates nl    NECK :  without  apparent JVD/ palpable Nodes/TM    LUNGS: no acc muscle use,  Mildly kyphtoic  contour chest with bilateral Insp pops/squeaks exp exp rhonchi   CV:  RRR  no s3 or murmur or increase in P2, and no edema   ABD:  soft and nontender   MS:  Gait nl   ext warm without deformities Or obvious joint restrictions  calf tenderness, cyanosis or clubbing    SKIN: warm and dry without lesions    NEURO:  alert, approp, nl sensorium with  no motor or cerebellar deficits apparent.               Assessment   Assessment & Plan Allergic rhinitis, unspecified seasonality, unspecified trigger Worse rhinitis sometimes  purulent since summer of 2025  - referred back to [facility] practice 01/25/2024   Asthmatic bronchitis , chronic (HCC) Never smoker/ onset was 2007 (Dr Brien eval)with assoc chronic sinusitis s/p sins surgery prior to 2010 - PFTs 04/30/15 minimal airflow obstruction with nl fev1/fvc and mild curvature on f/v loop - Chest CT cuts on abd CT  02/23/18 Lung bases demonstrate minimal bronchiectasis in the lingula with mild scarring at the right middle lobe and lingula - 07/31/2023  After extensive coaching inhaler device,  effectiveness =  75% (short Ti for hfa) symbicort  80  - 07/31/2023   Walked on RA   x  3  lap(s) =  approx 450  ft  @ fast pace, stopped due to end of study  with lowest 02 sats 95% and no desats  on accolate  only  PFT's  10/24/2023   FEV1 1.08 (55 % ) ratio 0.62  p 14 % improvement from saba p 0 prior to study  and FV curve moderate concavity >>>>>Try change to Breztri  2bid , low threshold for adding  biologic rx  - CT 01/09/23  dx Bronchiectasis with ?MAI so rec change breztri  to symbicort  80 to reduce ICS component   Discussed implication of bronchiectasis and ? MAI in detail including pathophysiology and rx   MAI  is an extremely common benign condition in the elderly and does not warrant aggressive eval/ rx at this point unless there is a clinical correlation suggesting unaddressed pulmonary infection (purulent sputum, night sweats, unintended wt loss, doe) or evolution of  obvious changes on plain cxr -   my overall feeling is she'll do fine just using a watch a wait approach  ie  when symptoms warrant additional measures which might just include short course abx as in the past or FOB if warranted to isolate/ identify and document the organisms involved prior to committing her to longterm rx (noting that often the treatment causes more symptoms than the dz).   In fact, in study published July 2024 in [facility],  patients with bronchiectasis with vs without proven atypical TB had similar  outcomes x 5 years of the study, in temis of admits/loss of lung function, exac and death indicating MAI is typically more of a colonizer than a pathogen in most immunocompetent adults   Discussed in detail all the  indications, usual  risks and alternatives  relative to the benefits with patient who agrees to proceed with conservative rx  as follows:  1) first try doxy x 10d prn and reduce ICS dose as above  2)  Max mucinex/ flutter valve training   F/u 3 m sooner if needed  Each maintenance medication was reviewed in detail including emphasizing most importantly the difference between maintenance and prns and under what circumstances the prns are to be triggered using an action plan format where appropriate.  Total time for H and P, chart review, counseling, reviewing hfa/flutter/neb  device(s) and generating customized AVS unique to this office visit / same day charting = 42 min for new dx discussion          AVS  Patient Instructions  Stop breztri  and start Symbicort  80 2 Take 2 puffs first thing in am and then another 2 puffs about 12 hours later.   For nasty mucus > doxycycline  100 mg twice daily x 10 days  to see if turns  mucus clear  - refillable    For cough/ congestion > mucinex or mucinex dm  up to maximum of  1200 mg every 12 hours and use the flutter valve as much as you can    Please schedule a follow up visit in 3 months but call sooner if needed               Ozell America, MD 01/26/2024                 "

## 2024-01-25 NOTE — Patient Instructions (Addendum)
 Stop breztri  and start Symbicort  80 2 Take 2 puffs first thing in am and then another 2 puffs about 12 hours later.   For nasty mucus > doxycycline  100 mg twice daily x 10 days  to see if turns  mucus clear  - refillable    For cough/ congestion > mucinex or mucinex dm  up to maximum of  1200 mg every 12 hours and use the flutter valve as much as you can    Please schedule a follow up visit in 3 months but call sooner if needed

## 2024-01-26 NOTE — Assessment & Plan Note (Addendum)
 Never smoker/ onset was 2007 (Dr Brien eval)with assoc chronic sinusitis s/p sins surgery prior to 2010 - PFTs 04/30/15 minimal airflow obstruction with nl fev1/fvc and mild curvature on f/v loop - Chest CT cuts on abd CT  02/23/18 Lung bases demonstrate minimal bronchiectasis in the lingula with mild scarring at the right middle lobe and lingula - 07/31/2023  After extensive coaching inhaler device,  effectiveness =  75% (short Ti for hfa) symbicort  80  - 07/31/2023   Walked on RA   x  3  lap(s) =  approx 450  ft  @ fast pace, stopped due to end of study  with lowest 02 sats 95% and no desats  on accolate  only  PFT's  10/24/2023   FEV1 1.08 (55 % ) ratio 0.62  p 14 % improvement from saba p 0 prior to study  and FV curve moderate concavity >>>>>Try change to Breztri  2bid , low threshold for adding  biologic rx  - CT 01/09/23  dx Bronchiectasis with ?MAI so rec change breztri  to symbicort  80 to reduce ICS component   Discussed implication of bronchiectasis and ? MAI in detail including pathophysiology and rx   MAI  is an extremely common benign condition in the elderly and does not warrant aggressive eval/ rx at this point unless there is a clinical correlation suggesting unaddressed pulmonary infection (purulent sputum, night sweats, unintended wt loss, doe) or evolution of  obvious changes on plain cxr -   my overall feeling is she'll do fine just using a watch a wait approach  ie  when symptoms warrant additional measures which might just include short course abx as in the past or FOB if warranted to isolate/ identify and document the organisms involved prior to committing her to longterm rx (noting that often the treatment causes more symptoms than the dz).   In fact, in study published July 2024 in [facility],  patients with bronchiectasis with vs without proven atypical TB had similar outcomes x 5 years of the study, in temis of admits/loss of lung function, exac and death indicating MAI is typically more of a  colonizer than a pathogen in most immunocompetent adults   Discussed in detail all the  indications, usual  risks and alternatives  relative to the benefits with patient who agrees to proceed with conservative rx  as follows:  1) first try doxy x 10d prn and reduce ICS dose as above  2)  Max mucinex/ flutter valve training   F/u 3 m sooner if needed          Each maintenance medication was reviewed in detail including emphasizing most importantly the difference between maintenance and prns and under what circumstances the prns are to be triggered using an action plan format where appropriate.  Total time for H and P, chart review, counseling, reviewing hfa/flutter/neb  device(s) and generating customized AVS unique to this office visit / same day charting = 42 min for new dx discussion

## 2024-02-09 ENCOUNTER — Inpatient Hospital Stay: Attending: Hematology

## 2024-02-09 DIAGNOSIS — D500 Iron deficiency anemia secondary to blood loss (chronic): Secondary | ICD-10-CM

## 2024-02-09 DIAGNOSIS — D801 Nonfamilial hypogammaglobulinemia: Secondary | ICD-10-CM

## 2024-02-09 LAB — CBC WITH DIFFERENTIAL/PLATELET
Abs Immature Granulocytes: 0.01 10*3/uL (ref 0.00–0.07)
Basophils Absolute: 0.1 10*3/uL (ref 0.0–0.1)
Basophils Relative: 1 %
Eosinophils Absolute: 0.2 10*3/uL (ref 0.0–0.5)
Eosinophils Relative: 3 %
HCT: 40.1 % (ref 36.0–46.0)
Hemoglobin: 12.7 g/dL (ref 12.0–15.0)
Immature Granulocytes: 0 %
Lymphocytes Relative: 30 %
Lymphs Abs: 1.9 10*3/uL (ref 0.7–4.0)
MCH: 31.4 pg (ref 26.0–34.0)
MCHC: 31.7 g/dL (ref 30.0–36.0)
MCV: 99.3 fL (ref 80.0–100.0)
Monocytes Absolute: 0.6 10*3/uL (ref 0.1–1.0)
Monocytes Relative: 9 %
Neutro Abs: 3.7 10*3/uL (ref 1.7–7.7)
Neutrophils Relative %: 57 %
Platelets: 171 10*3/uL (ref 150–400)
RBC: 4.04 MIL/uL (ref 3.87–5.11)
RDW: 12.4 % (ref 11.5–15.5)
WBC: 6.4 10*3/uL (ref 4.0–10.5)
nRBC: 0 % (ref 0.0–0.2)

## 2024-02-09 LAB — COMPREHENSIVE METABOLIC PANEL WITH GFR
ALT: 14 U/L (ref 0–44)
AST: 26 U/L (ref 15–41)
Albumin: 4 g/dL (ref 3.5–5.0)
Alkaline Phosphatase: 128 U/L — ABNORMAL HIGH (ref 38–126)
Anion gap: 13 (ref 5–15)
BUN: 19 mg/dL (ref 8–23)
CO2: 24 mmol/L (ref 22–32)
Calcium: 9.1 mg/dL (ref 8.9–10.3)
Chloride: 104 mmol/L (ref 98–111)
Creatinine, Ser: 1.02 mg/dL — ABNORMAL HIGH (ref 0.44–1.00)
GFR, Estimated: 60 mL/min — ABNORMAL LOW
Glucose, Bld: 95 mg/dL (ref 70–99)
Potassium: 4.4 mmol/L (ref 3.5–5.1)
Sodium: 141 mmol/L (ref 135–145)
Total Bilirubin: 0.5 mg/dL (ref 0.0–1.2)
Total Protein: 7.2 g/dL (ref 6.5–8.1)

## 2024-02-09 LAB — MAGNESIUM: Magnesium: 1.9 mg/dL (ref 1.7–2.4)

## 2024-02-09 LAB — IRON AND TIBC
Iron: 74 ug/dL (ref 28–170)
Saturation Ratios: 35 % — ABNORMAL HIGH (ref 10.4–31.8)
TIBC: 211 ug/dL — ABNORMAL LOW (ref 250–450)
UIBC: 138 ug/dL

## 2024-02-09 LAB — FERRITIN: Ferritin: 251 ng/mL (ref 11–307)

## 2024-02-09 LAB — LACTATE DEHYDROGENASE: LDH: 211 U/L (ref 105–235)

## 2024-02-13 ENCOUNTER — Inpatient Hospital Stay: Admitting: Oncology

## 2024-02-13 ENCOUNTER — Inpatient Hospital Stay

## 2024-04-24 ENCOUNTER — Ambulatory Visit: Admitting: Internal Medicine
